# Patient Record
Sex: Male | Born: 1960
Health system: Southern US, Community
[De-identification: ages and names within clinical notes are randomized; demographics above are authoritative.]

## PROBLEM LIST (undated history)

## (undated) DIAGNOSIS — M1990 Unspecified osteoarthritis, unspecified site: Secondary | ICD-10-CM

## (undated) DIAGNOSIS — Z9981 Dependence on supplemental oxygen: Secondary | ICD-10-CM

## (undated) DIAGNOSIS — N200 Calculus of kidney: Secondary | ICD-10-CM

## (undated) DIAGNOSIS — Z8489 Family history of other specified conditions: Secondary | ICD-10-CM

## (undated) DIAGNOSIS — R0902 Hypoxemia: Secondary | ICD-10-CM

## (undated) DIAGNOSIS — R51 Headache: Secondary | ICD-10-CM

## (undated) DIAGNOSIS — G4733 Obstructive sleep apnea (adult) (pediatric): Secondary | ICD-10-CM

## (undated) DIAGNOSIS — E785 Hyperlipidemia, unspecified: Secondary | ICD-10-CM

## (undated) DIAGNOSIS — G629 Polyneuropathy, unspecified: Secondary | ICD-10-CM

## (undated) DIAGNOSIS — G839 Paralytic syndrome, unspecified: Secondary | ICD-10-CM

## (undated) DIAGNOSIS — I509 Heart failure, unspecified: Secondary | ICD-10-CM

## (undated) DIAGNOSIS — G709 Myoneural disorder, unspecified: Secondary | ICD-10-CM

## (undated) DIAGNOSIS — I517 Cardiomegaly: Secondary | ICD-10-CM

## (undated) DIAGNOSIS — K589 Irritable bowel syndrome without diarrhea: Secondary | ICD-10-CM

## (undated) DIAGNOSIS — T7840XA Allergy, unspecified, initial encounter: Secondary | ICD-10-CM

## (undated) DIAGNOSIS — IMO0002 Reserved for concepts with insufficient information to code with codable children: Secondary | ICD-10-CM

## (undated) DIAGNOSIS — T8859XA Other complications of anesthesia, initial encounter: Secondary | ICD-10-CM

## (undated) DIAGNOSIS — T4145XA Adverse effect of unspecified anesthetic, initial encounter: Secondary | ICD-10-CM

## (undated) DIAGNOSIS — R413 Other amnesia: Secondary | ICD-10-CM

## (undated) DIAGNOSIS — E291 Testicular hypofunction: Secondary | ICD-10-CM

## (undated) DIAGNOSIS — R519 Headache, unspecified: Secondary | ICD-10-CM

## (undated) DIAGNOSIS — N400 Enlarged prostate without lower urinary tract symptoms: Secondary | ICD-10-CM

## (undated) DIAGNOSIS — N419 Inflammatory disease of prostate, unspecified: Secondary | ICD-10-CM

## (undated) DIAGNOSIS — F419 Anxiety disorder, unspecified: Secondary | ICD-10-CM

## (undated) DIAGNOSIS — J189 Pneumonia, unspecified organism: Secondary | ICD-10-CM

## (undated) DIAGNOSIS — I10 Essential (primary) hypertension: Secondary | ICD-10-CM

## (undated) DIAGNOSIS — R0600 Dyspnea, unspecified: Secondary | ICD-10-CM

## (undated) DIAGNOSIS — F329 Major depressive disorder, single episode, unspecified: Secondary | ICD-10-CM

## (undated) DIAGNOSIS — R7303 Prediabetes: Secondary | ICD-10-CM

## (undated) DIAGNOSIS — J449 Chronic obstructive pulmonary disease, unspecified: Secondary | ICD-10-CM

## (undated) DIAGNOSIS — H269 Unspecified cataract: Secondary | ICD-10-CM

## (undated) DIAGNOSIS — F32A Depression, unspecified: Secondary | ICD-10-CM

## (undated) DIAGNOSIS — U071 COVID-19: Secondary | ICD-10-CM

## (undated) DIAGNOSIS — K219 Gastro-esophageal reflux disease without esophagitis: Secondary | ICD-10-CM

## (undated) DIAGNOSIS — G4730 Sleep apnea, unspecified: Secondary | ICD-10-CM

## (undated) DIAGNOSIS — E669 Obesity, unspecified: Secondary | ICD-10-CM

## (undated) HISTORY — PX: COLONOSCOPY: SHX174

## (undated) HISTORY — DX: Depression, unspecified: F32.A

## (undated) HISTORY — DX: Other complications of anesthesia, initial encounter: T88.59XA

## (undated) HISTORY — PX: ABDOMINAL SURGERY: SHX537

## (undated) HISTORY — DX: Reserved for concepts with insufficient information to code with codable children: IMO0002

## (undated) HISTORY — DX: Unspecified cataract: H26.9

## (undated) HISTORY — DX: Inflammatory disease of prostate, unspecified: N41.9

## (undated) HISTORY — DX: Heart failure, unspecified: I50.9

## (undated) HISTORY — DX: Hyperlipidemia, unspecified: E78.5

## (undated) HISTORY — PX: FRACTURE SURGERY: SHX138

## (undated) HISTORY — DX: Hypoxemia: R09.02

## (undated) HISTORY — PX: CYSTOSCOPY: SUR368

## (undated) HISTORY — PX: ANKLE FRACTURE SURGERY: SHX122

## (undated) HISTORY — DX: Sleep apnea, unspecified: G47.30

## (undated) HISTORY — DX: Irritable bowel syndrome, unspecified: K58.9

## (undated) HISTORY — PX: SPINE SURGERY: SHX786

## (undated) HISTORY — PX: UVULOPALATOPHARYNGOPLASTY: SHX827

## (undated) HISTORY — DX: Allergy, unspecified, initial encounter: T78.40XA

## (undated) HISTORY — DX: Myoneural disorder, unspecified: G70.9

## (undated) HISTORY — DX: Benign prostatic hyperplasia without lower urinary tract symptoms: N40.0

## (undated) HISTORY — DX: Testicular hypofunction: E29.1

## (undated) HISTORY — DX: Calculus of kidney: N20.0

## (undated) HISTORY — DX: Obstructive sleep apnea (adult) (pediatric): G47.33

## (undated) HISTORY — PX: TONSILLECTOMY: SUR1361

---
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
## 1998-06-23 ENCOUNTER — Emergency Department (HOSPITAL_COMMUNITY): Admission: EM | Admit: 1998-06-23 | Discharge: 1998-06-23 | Payer: Self-pay

## 2000-11-16 ENCOUNTER — Emergency Department (HOSPITAL_COMMUNITY): Admission: EM | Admit: 2000-11-16 | Discharge: 2000-11-16 | Payer: Self-pay | Admitting: Emergency Medicine

## 2000-12-28 ENCOUNTER — Ambulatory Visit (HOSPITAL_COMMUNITY): Admission: RE | Admit: 2000-12-28 | Discharge: 2000-12-28 | Payer: Self-pay | Admitting: Internal Medicine

## 2000-12-28 ENCOUNTER — Encounter: Payer: Self-pay | Admitting: Internal Medicine

## 2001-01-18 ENCOUNTER — Ambulatory Visit (HOSPITAL_COMMUNITY): Admission: RE | Admit: 2001-01-18 | Discharge: 2001-01-18 | Payer: Self-pay | Admitting: Gastroenterology

## 2001-02-04 ENCOUNTER — Encounter: Admission: RE | Admit: 2001-02-04 | Discharge: 2001-02-04 | Payer: Self-pay | Admitting: Urology

## 2001-02-04 ENCOUNTER — Encounter: Payer: Self-pay | Admitting: Urology

## 2001-12-24 ENCOUNTER — Ambulatory Visit (HOSPITAL_COMMUNITY): Admission: RE | Admit: 2001-12-24 | Discharge: 2001-12-24 | Payer: Self-pay | Admitting: Internal Medicine

## 2001-12-24 ENCOUNTER — Encounter: Payer: Self-pay | Admitting: Internal Medicine

## 2005-05-26 HISTORY — PX: TURBINATE RESECTION: SHX6158

## 2005-05-26 LAB — HM COLONOSCOPY

## 2005-07-04 ENCOUNTER — Ambulatory Visit (HOSPITAL_COMMUNITY): Admission: RE | Admit: 2005-07-04 | Discharge: 2005-07-04 | Payer: Self-pay | Admitting: Internal Medicine

## 2008-01-25 ENCOUNTER — Ambulatory Visit (HOSPITAL_COMMUNITY): Admission: RE | Admit: 2008-01-25 | Discharge: 2008-01-25 | Payer: Self-pay | Admitting: Internal Medicine

## 2008-05-11 ENCOUNTER — Encounter: Admission: RE | Admit: 2008-05-11 | Discharge: 2008-05-11 | Payer: Self-pay | Admitting: Internal Medicine

## 2009-08-07 ENCOUNTER — Encounter: Admission: RE | Admit: 2009-08-07 | Discharge: 2009-08-07 | Payer: Self-pay | Admitting: Internal Medicine

## 2010-09-09 ENCOUNTER — Other Ambulatory Visit: Payer: Self-pay | Admitting: Internal Medicine

## 2010-09-09 ENCOUNTER — Encounter (HOSPITAL_BASED_OUTPATIENT_CLINIC_OR_DEPARTMENT_OTHER): Payer: BC Managed Care – PPO | Admitting: Internal Medicine

## 2010-09-09 DIAGNOSIS — N400 Enlarged prostate without lower urinary tract symptoms: Secondary | ICD-10-CM

## 2010-09-09 DIAGNOSIS — I10 Essential (primary) hypertension: Secondary | ICD-10-CM

## 2010-09-09 DIAGNOSIS — J45909 Unspecified asthma, uncomplicated: Secondary | ICD-10-CM

## 2010-09-09 DIAGNOSIS — D72829 Elevated white blood cell count, unspecified: Secondary | ICD-10-CM

## 2010-09-09 LAB — CBC WITH DIFFERENTIAL/PLATELET
BASO%: 1 % (ref 0.0–2.0)
Basophils Absolute: 0.1 10*3/uL (ref 0.0–0.1)
EOS%: 2 % (ref 0.0–7.0)
Eosinophils Absolute: 0.2 10*3/uL (ref 0.0–0.5)
HCT: 43.3 % (ref 38.4–49.9)
HGB: 14.8 g/dL (ref 13.0–17.1)
LYMPH%: 26.6 % (ref 14.0–49.0)
MCH: 30.6 pg (ref 27.2–33.4)
MCHC: 34.2 g/dL (ref 32.0–36.0)
MCV: 89.4 fL (ref 79.3–98.0)
MONO#: 1 10*3/uL — ABNORMAL HIGH (ref 0.1–0.9)
MONO%: 9.4 % (ref 0.0–14.0)
NEUT#: 6.5 10*3/uL (ref 1.5–6.5)
NEUT%: 61 % (ref 39.0–75.0)
Platelets: 277 10*3/uL (ref 140–400)
RBC: 4.84 10*6/uL (ref 4.20–5.82)
RDW: 13.2 % (ref 11.0–14.6)
WBC: 10.7 10*3/uL — ABNORMAL HIGH (ref 4.0–10.3)
lymph#: 2.8 10*3/uL (ref 0.9–3.3)

## 2010-09-10 LAB — COMPREHENSIVE METABOLIC PANEL
ALT: 24 U/L (ref 0–53)
AST: 16 U/L (ref 0–37)
Alkaline Phosphatase: 64 U/L (ref 39–117)
Creatinine, Ser: 1 mg/dL (ref 0.40–1.50)
Sodium: 138 mEq/L (ref 135–145)
Total Bilirubin: 0.9 mg/dL (ref 0.3–1.2)

## 2010-12-25 ENCOUNTER — Other Ambulatory Visit: Payer: Self-pay | Admitting: Internal Medicine

## 2010-12-25 ENCOUNTER — Encounter (HOSPITAL_BASED_OUTPATIENT_CLINIC_OR_DEPARTMENT_OTHER): Payer: BC Managed Care – PPO | Admitting: Internal Medicine

## 2010-12-25 DIAGNOSIS — D72829 Elevated white blood cell count, unspecified: Secondary | ICD-10-CM

## 2010-12-25 LAB — CBC WITH DIFFERENTIAL/PLATELET
Basophils Absolute: 0.1 10*3/uL (ref 0.0–0.1)
Eosinophils Absolute: 0.3 10*3/uL (ref 0.0–0.5)
HCT: 41.9 % (ref 38.4–49.9)
HGB: 14.1 g/dL (ref 13.0–17.1)
MCV: 89.8 fL (ref 79.3–98.0)
MONO%: 9.7 % (ref 0.0–14.0)
NEUT#: 5.8 10*3/uL (ref 1.5–6.5)
NEUT%: 55.2 % (ref 39.0–75.0)
Platelets: 275 10*3/uL (ref 140–400)
RDW: 13.3 % (ref 11.0–14.6)

## 2011-01-15 ENCOUNTER — Ambulatory Visit (HOSPITAL_COMMUNITY)
Admission: RE | Admit: 2011-01-15 | Discharge: 2011-01-15 | Disposition: A | Discharge status: Home / Self Care | Payer: BC Managed Care – PPO | Source: Ambulatory Visit | Attending: Internal Medicine | Admitting: Internal Medicine

## 2011-01-15 ENCOUNTER — Other Ambulatory Visit (HOSPITAL_COMMUNITY): Payer: Self-pay | Admitting: Internal Medicine

## 2011-01-15 DIAGNOSIS — I10 Essential (primary) hypertension: Secondary | ICD-10-CM | POA: Insufficient documentation

## 2011-01-15 DIAGNOSIS — J42 Unspecified chronic bronchitis: Secondary | ICD-10-CM | POA: Insufficient documentation

## 2011-01-15 DIAGNOSIS — J45909 Unspecified asthma, uncomplicated: Secondary | ICD-10-CM | POA: Insufficient documentation

## 2011-01-15 DIAGNOSIS — R509 Fever, unspecified: Secondary | ICD-10-CM

## 2011-01-29 ENCOUNTER — Ambulatory Visit: Payer: BC Managed Care – PPO | Attending: Unknown Physician Specialty | Admitting: Physical Therapy

## 2011-01-29 DIAGNOSIS — R262 Difficulty in walking, not elsewhere classified: Secondary | ICD-10-CM | POA: Insufficient documentation

## 2011-01-29 DIAGNOSIS — M2560 Stiffness of unspecified joint, not elsewhere classified: Secondary | ICD-10-CM | POA: Insufficient documentation

## 2011-01-29 DIAGNOSIS — M545 Low back pain, unspecified: Secondary | ICD-10-CM | POA: Insufficient documentation

## 2011-01-29 DIAGNOSIS — IMO0001 Reserved for inherently not codable concepts without codable children: Secondary | ICD-10-CM | POA: Insufficient documentation

## 2011-01-31 DIAGNOSIS — K589 Irritable bowel syndrome without diarrhea: Secondary | ICD-10-CM | POA: Insufficient documentation

## 2011-01-31 DIAGNOSIS — N400 Enlarged prostate without lower urinary tract symptoms: Secondary | ICD-10-CM | POA: Insufficient documentation

## 2011-01-31 DIAGNOSIS — E291 Testicular hypofunction: Secondary | ICD-10-CM | POA: Insufficient documentation

## 2011-01-31 DIAGNOSIS — I10 Essential (primary) hypertension: Secondary | ICD-10-CM | POA: Insufficient documentation

## 2011-01-31 DIAGNOSIS — N419 Inflammatory disease of prostate, unspecified: Secondary | ICD-10-CM | POA: Insufficient documentation

## 2011-01-31 DIAGNOSIS — J329 Chronic sinusitis, unspecified: Secondary | ICD-10-CM

## 2011-01-31 DIAGNOSIS — G43909 Migraine, unspecified, not intractable, without status migrainosus: Secondary | ICD-10-CM | POA: Insufficient documentation

## 2011-01-31 DIAGNOSIS — E782 Mixed hyperlipidemia: Secondary | ICD-10-CM | POA: Insufficient documentation

## 2011-01-31 DIAGNOSIS — F329 Major depressive disorder, single episode, unspecified: Secondary | ICD-10-CM

## 2011-01-31 DIAGNOSIS — G40219 Localization-related (focal) (partial) symptomatic epilepsy and epileptic syndromes with complex partial seizures, intractable, without status epilepticus: Secondary | ICD-10-CM

## 2011-01-31 DIAGNOSIS — F32A Depression, unspecified: Secondary | ICD-10-CM

## 2011-01-31 DIAGNOSIS — J449 Chronic obstructive pulmonary disease, unspecified: Secondary | ICD-10-CM | POA: Insufficient documentation

## 2011-01-31 DIAGNOSIS — F3341 Major depressive disorder, recurrent, in partial remission: Secondary | ICD-10-CM | POA: Insufficient documentation

## 2011-01-31 DIAGNOSIS — E785 Hyperlipidemia, unspecified: Secondary | ICD-10-CM

## 2011-01-31 DIAGNOSIS — F341 Dysthymic disorder: Secondary | ICD-10-CM

## 2011-02-04 ENCOUNTER — Ambulatory Visit: Payer: BC Managed Care – PPO

## 2011-02-11 ENCOUNTER — Ambulatory Visit: Payer: BC Managed Care – PPO | Admitting: Physical Therapy

## 2011-02-18 ENCOUNTER — Ambulatory Visit: Payer: BC Managed Care – PPO | Admitting: Infectious Disease

## 2011-02-18 ENCOUNTER — Ambulatory Visit: Payer: BC Managed Care – PPO | Admitting: Rehabilitation

## 2011-02-19 ENCOUNTER — Ambulatory Visit: Payer: BC Managed Care – PPO | Admitting: Infectious Disease

## 2011-02-24 ENCOUNTER — Ambulatory Visit (INDEPENDENT_AMBULATORY_CARE_PROVIDER_SITE_OTHER): Payer: BC Managed Care – PPO | Admitting: Infectious Disease

## 2011-02-24 ENCOUNTER — Encounter: Payer: Self-pay | Admitting: Infectious Disease

## 2011-02-24 VITALS — BP 116/72 | HR 69 | Temp 99.4°F | Wt 311.0 lb

## 2011-02-24 DIAGNOSIS — R635 Abnormal weight gain: Secondary | ICD-10-CM

## 2011-02-24 DIAGNOSIS — R509 Fever, unspecified: Secondary | ICD-10-CM

## 2011-02-24 DIAGNOSIS — R5383 Other fatigue: Secondary | ICD-10-CM

## 2011-02-24 DIAGNOSIS — J329 Chronic sinusitis, unspecified: Secondary | ICD-10-CM

## 2011-02-24 DIAGNOSIS — N400 Enlarged prostate without lower urinary tract symptoms: Secondary | ICD-10-CM

## 2011-02-24 DIAGNOSIS — R413 Other amnesia: Secondary | ICD-10-CM

## 2011-02-24 DIAGNOSIS — N419 Inflammatory disease of prostate, unspecified: Secondary | ICD-10-CM

## 2011-02-24 DIAGNOSIS — R61 Generalized hyperhidrosis: Secondary | ICD-10-CM

## 2011-02-24 DIAGNOSIS — R200 Anesthesia of skin: Secondary | ICD-10-CM

## 2011-02-24 DIAGNOSIS — D72829 Elevated white blood cell count, unspecified: Secondary | ICD-10-CM

## 2011-02-24 DIAGNOSIS — R209 Unspecified disturbances of skin sensation: Secondary | ICD-10-CM

## 2011-02-24 DIAGNOSIS — R5381 Other malaise: Secondary | ICD-10-CM

## 2011-02-24 NOTE — Assessment & Plan Note (Addendum)
Curious history. Be highly unlikely for this patient to have a malignancy or a pyogenic infectious problem or even a infection with tuberculosis or dimorphic fungus that would last for 4 years. Certainly other as he says his connective tissue disorder due to need to be considered and he does have some mild disease about historian clinically has chronic leukocytosis. We will basic fever unknown origin labs including checking an HIV RNA of note his HIV antibody was negative RA, will check cytomegalovirus IgG and IgM we'll check an Epstein-Barr virus panel we'll check an acute viral hepatitis panel will check a CPK and LDH and ACE level a serum ferritin level will check a CBC with differential a comprehensive metabolic panel will check a serum protein electrophoresis serum cryoglobulins, serum quantiferon gold, dry factor and an ANA another set of blood cultures. Will check a CT of the chest abdomen and pelvis. I suspect the patient's symptoms may relate be related to something such as chronic fatigue but certainly this is a diagnosis of exclusion. Nails consider bone marrow biopsy given his elevated white blood cell count that we do not really have an explanation for.

## 2011-02-24 NOTE — Assessment & Plan Note (Signed)
Of discussion. Was thought to be reactive by oncology but we do not know what is the anterior that is causing this reactive leukocytosis. It appears to be chronic in nature. I think a bone marrow biopsy may be something that may be of use in the future.

## 2011-02-24 NOTE — Assessment & Plan Note (Signed)
He has had problems with recurrent prostatitis. However recent cultures have all been negative. I did he's ever been treated for chronic prostatitis with a prolonged course of antibiotics I would've thought that he would've by now but he did obtain further records. We could consider retreating him for chronic prostatitis if the entire fever of unknown origin workup is negative.

## 2011-02-24 NOTE — Progress Notes (Signed)
Subjective:    Patient ID: Mark Harrell, male    DOB: 07-Jan-1961, 50 y.o.   MRN: [id number]  HPI  Is a 50 year old Caucasian male with past medical history  Significant for head injury in the 1980s, now with memory problems (on aricept and namenda) complex partial seizure disorder, BPH, hypogonadism, seminal vesiculitis, bladder outlet obstruction, recurrent sinusitis and chronic leukocytosis of unknown cause. He had already been seen by one of my partners thirty years ago (Dr Maurice March) for an "odd viral infection." He states that approximately 4 years ago he began to notice persistent temperature elevlations with temp always runnin at 99 or higher, at times above 100. He also has been complaining of diffuse sweating, drenching at times, malaise, poor energy. He denies weight gain and has actually gained weight over these past several years and now has sleep apnea. He denies symptoms of nausea or vomiting or diarrhea. He has had problems related to his prostate including an enlarged prostate and difficulty with nocturia at times of urinary incontinence. He's been seen closely by Dr. Patsi Sears with urology.   He does have an extensive travel history having traveled throughout [place] use in [place] including the former [employer] several countries and former [place] as well as after a couple twice in his affect and [place]. He's never tested positive for tuberculosis. As mentioned he has seen Dr. Arbutus Ped in the cancer center for workup of his leukocytosis Dr. Holley Dexter felt the patient had chronic reactive leukocytosis although it is not clear what the patient is having reactive leukocytosis 2. Most recent labs on him were significant for a C-reactive protein of 10.3 white count of 10,700 normal liver function tests normal thyroid-stimulating hormone slightly low testosterone level negative HIV 1 and 2 test negative gonorrhea and Chlamydia test negative for herpes 1 and 2 IgG he's a chest x-ray which  shows cardiomegaly and some bronchitic changes his 2-D echocardiogram which shows a difficult study with left ventricle is borderline dilated and ejection fraction greater than 55% with some mild mitral regurgitation blood culture then obtained and are negative urine cultures have not grown anything either on recent cultures. We were Consulted by the patient's primary care physician Dr. Kasandra Knudsen to assist in the workup of this patient with fever of unknown origin. In total we spent an hour with the pt including greater than 50% of time counselling the patient face to face and in coordination of his care and review of his records.  Review of Systems  Constitutional: Positive for fever, diaphoresis, fatigue and unexpected weight change. Negative for chills, activity change and appetite change.  HENT: Negative for congestion, sore throat, rhinorrhea, sneezing, trouble swallowing and sinus pressure.   Eyes: Negative for photophobia and visual disturbance.  Respiratory: Negative for cough, chest tightness, shortness of breath, wheezing and stridor.   Cardiovascular: Positive for palpitations. Negative for chest pain and leg swelling.  Gastrointestinal: Negative for nausea, vomiting, abdominal pain, diarrhea, constipation, blood in stool, abdominal distention and anal bleeding.  Genitourinary: Positive for frequency and difficulty urinating. Negative for dysuria, hematuria and flank pain.  Musculoskeletal: Positive for myalgias. Negative for back pain, joint swelling, arthralgias and gait problem.  Skin: Negative for color change, pallor, rash and wound.  Neurological: Negative for dizziness, tremors, weakness and light-headedness.  Hematological: Negative for adenopathy. Does not bruise/bleed easily.  Psychiatric/Behavioral: Positive for dysphoric mood. Negative for behavioral problems, confusion, sleep disturbance, decreased concentration and agitation.       Objective:  Physical Exam  Constitutional:  He is oriented to person, place, and time. He appears well-developed and well-nourished. No distress.  HENT:  Head: Normocephalic and atraumatic.  Mouth/Throat: Oropharynx is clear and moist. No oropharyngeal exudate.  Eyes: Conjunctivae and EOM are normal. Pupils are equal, round, and reactive to light. No scleral icterus.  Neck: Normal range of motion. Neck supple. No JVD present.  Cardiovascular: Normal rate, regular rhythm and normal heart sounds.  Exam reveals no gallop and no friction rub.   No murmur heard. Pulmonary/Chest: Effort normal and breath sounds normal. No respiratory distress. He has no wheezes. He has no rales. He exhibits no tenderness.  Abdominal: He exhibits no distension and no mass. There is no tenderness. There is no rebound and no guarding.  Musculoskeletal: He exhibits no edema and no tenderness.  Lymphadenopathy:    He has cervical adenopathy.       Right cervical: No superficial cervical, no deep cervical and no posterior cervical adenopathy present.      Left cervical: No superficial cervical, no deep cervical and no posterior cervical adenopathy present.       Right axillary: No pectoral and no lateral adenopathy present.       Left axillary: No pectoral and no lateral adenopathy present. Neurological: He is alert and oriented to person, place, and time. He has normal reflexes. He exhibits normal muscle tone. He displays no seizure activity. Coordination normal.  Skin: Skin is warm and dry. He is not diaphoretic. No erythema. No pallor.  Psychiatric: He has a normal mood and affect. His behavior is normal. Judgment and thought content normal.          Assessment & Plan:  Fever Curious history. Be highly unlikely for this patient to have a malignancy or a pyogenic infectious problem or even a infection with tuberculosis or dimorphic fungus that would last for 4 years. Certainly other as he says his connective tissue disorder due to need to be considered and  he does have some mild disease about historian clinically has chronic leukocytosis. We will basic fever unknown origin labs including checking an HIV RNA of note his HIV antibody was negative RA, will check cytomegalovirus IgG and IgM we'll check an Epstein-Barr virus panel we'll check an acute viral hepatitis panel will check a CPK and LDH and ACE level a serum ferritin level will check a CBC with differential a comprehensive metabolic panel will check a serum protein electrophoresis serum cryoglobulins, serum quantiferon gold, dry factor and an ANA another set of blood cultures. Will check a CT of the chest abdomen and pelvis. I suspect the patient's symptoms may relate be related to something such as chronic fatigue but certainly this is a diagnosis of exclusion. Nails consider bone marrow biopsy given his elevated white blood cell count that we do not really have an explanation for.  Leukocytosis Of discussion. Was thought to be reactive by oncology but we do not know what is the anterior that is causing this reactive leukocytosis. It appears to be chronic in nature. I think a bone marrow biopsy may be something that may be of use in the future.  Malaise and fatigue I suspect Chronic fatigue may be playing a role. Certainly if his sleep apnea is not well treated this could be contributing as well.  Diaphoresis Again his story is dramatic for the sweats but he lacks weight loss and this has persisted for 4 years making as I described above a serious pyogenic infection  or serious malignancy unlikely. His TSH was normal ? untreated sleep apnea that might be contributing to his night sweats  Left arm numbness Apparently MRIs have been normal. Otherwise I would consider possibility of discitis  Prostatitis He has had problems with recurrent prostatitis. However recent cultures have all been negative. I did he's ever been treated for chronic prostatitis with a prolonged course of antibiotics I would've  thought that he would've by now but he did obtain further records. We could consider retreating him for chronic prostatitis if the entire fever of unknown origin workup is negative.

## 2011-02-24 NOTE — Patient Instructions (Signed)
We need to perform blood work on you today We need to obtain scan of your chest abdomen and pelvis Keep a diary of your temperatures and symptoms

## 2011-02-24 NOTE — Assessment & Plan Note (Signed)
Apparently MRIs have been normal. Otherwise I would consider possibility of discitis

## 2011-02-24 NOTE — Assessment & Plan Note (Signed)
Again his story is dramatic for the sweats but he lacks weight loss and this has persisted for 4 years making as I described above a serious pyogenic infection or serious malignancy unlikely. His TSH was normal ? untreated sleep apnea that might be contributing to his night sweats

## 2011-02-24 NOTE — Assessment & Plan Note (Signed)
I suspect Chronic fatigue may be playing a role. Certainly if his sleep apnea is not well treated this could be contributing as well.

## 2011-02-25 ENCOUNTER — Other Ambulatory Visit: Payer: Self-pay | Admitting: Infectious Disease

## 2011-02-25 ENCOUNTER — Ambulatory Visit: Payer: BC Managed Care – PPO | Attending: Unknown Physician Specialty | Admitting: Physical Therapy

## 2011-02-25 DIAGNOSIS — M2560 Stiffness of unspecified joint, not elsewhere classified: Secondary | ICD-10-CM | POA: Insufficient documentation

## 2011-02-25 DIAGNOSIS — IMO0001 Reserved for inherently not codable concepts without codable children: Secondary | ICD-10-CM | POA: Insufficient documentation

## 2011-02-25 DIAGNOSIS — M545 Low back pain, unspecified: Secondary | ICD-10-CM | POA: Insufficient documentation

## 2011-02-25 DIAGNOSIS — R262 Difficulty in walking, not elsewhere classified: Secondary | ICD-10-CM | POA: Insufficient documentation

## 2011-02-25 LAB — LACTATE DEHYDROGENASE: LDH: 162 U/L (ref 94–250)

## 2011-02-25 LAB — COMPLETE METABOLIC PANEL WITH GFR
ALT: 20 U/L (ref 0–53)
AST: 17 U/L (ref 0–37)
Albumin: 4.2 g/dL (ref 3.5–5.2)
Alkaline Phosphatase: 79 U/L (ref 39–117)
GFR, Est Non African American: >60 mL/min (ref >60)
Glucose, Bld: 77 mg/dL (ref 70–99)
Potassium: 4.2 mEq/L (ref 3.5–5.3)
Sodium: 139 mEq/L (ref 135–145)
Total Bilirubin: 0.7 mg/dL (ref 0.3–1.2)
Total Protein: 6.6 g/dL (ref 6.0–8.3)

## 2011-02-25 LAB — CBC WITH DIFFERENTIAL/PLATELET
Basophils Absolute: 0.1 10*3/uL (ref 0.0–0.1)
Basophils Relative: 1 % (ref 0–1)
Eosinophils Absolute: 0.2 10*3/uL (ref 0.0–0.7)
Hemoglobin: 14.8 g/dL (ref 13.0–17.0)
MCH: 29 pg (ref 26.0–34.0)
MCHC: 32.2 g/dL (ref 30.0–36.0)
Monocytes Relative: 8 % (ref 3–12)
Neutro Abs: 5.3 10*3/uL (ref 1.7–7.7)
Neutrophils Relative %: 59 % (ref 43–77)
Platelets: 309 10*3/uL (ref 150–400)
RDW: 13.7 % (ref 11.5–15.5)

## 2011-02-25 LAB — FERRITIN: Ferritin: 179 ng/mL (ref 22–322)

## 2011-02-25 LAB — CK: Total CK: 52 U/L (ref 7–232)

## 2011-02-25 LAB — SEDIMENTATION RATE: Sed Rate: 8 mm/hr (ref 0–16)

## 2011-02-25 LAB — HEPATITIS PANEL, ACUTE
Hep A IgM: NEGATIVE
Hep B C IgM: NEGATIVE
Hepatitis B Surface Ag: NEGATIVE

## 2011-02-25 LAB — C-REACTIVE PROTEIN: CRP: 1.12 mg/dL — ABNORMAL HIGH (ref <0.60)

## 2011-02-25 LAB — RHEUMATOID FACTOR: Rhuematoid fact SerPl-aCnc: <10 IU/mL (ref <=14)

## 2011-02-25 LAB — EPSTEIN-BARR VIRUS VCA ANTIBODY PANEL: EBV NA IgG: 3.52 {ISR} — ABNORMAL HIGH

## 2011-02-26 LAB — PROTEIN ELECTROPHORESIS, SERUM
Alpha-1-Globulin: 5.4 % — ABNORMAL HIGH (ref 2.9–4.9)
Beta 2: 5.9 % (ref 3.2–6.5)
Gamma Globulin: 13.4 % (ref 11.1–18.8)

## 2011-02-27 ENCOUNTER — Other Ambulatory Visit (HOSPITAL_COMMUNITY): Payer: BC Managed Care – PPO

## 2011-03-01 LAB — CRYOGLOBULIN

## 2011-03-03 LAB — CMV ABS, IGG+IGM (CYTOMEGALOVIRUS): CMV IgM: 0.09 (ref <0.90)

## 2011-03-03 LAB — CULTURE, BLOOD (SINGLE): Organism ID, Bacteria: NO GROWTH

## 2011-03-06 ENCOUNTER — Encounter (HOSPITAL_COMMUNITY): Payer: Self-pay

## 2011-03-06 ENCOUNTER — Ambulatory Visit (HOSPITAL_COMMUNITY)
Admission: RE | Admit: 2011-03-06 | Discharge: 2011-03-06 | Disposition: A | Discharge status: Home / Self Care | Payer: BC Managed Care – PPO | Source: Ambulatory Visit | Attending: Infectious Disease | Admitting: Infectious Disease

## 2011-03-06 DIAGNOSIS — K589 Irritable bowel syndrome without diarrhea: Secondary | ICD-10-CM | POA: Insufficient documentation

## 2011-03-06 DIAGNOSIS — I10 Essential (primary) hypertension: Secondary | ICD-10-CM | POA: Insufficient documentation

## 2011-03-06 DIAGNOSIS — K7689 Other specified diseases of liver: Secondary | ICD-10-CM | POA: Insufficient documentation

## 2011-03-06 DIAGNOSIS — R52 Pain, unspecified: Secondary | ICD-10-CM | POA: Insufficient documentation

## 2011-03-06 DIAGNOSIS — R509 Fever, unspecified: Secondary | ICD-10-CM | POA: Insufficient documentation

## 2011-03-06 DIAGNOSIS — K5730 Diverticulosis of large intestine without perforation or abscess without bleeding: Secondary | ICD-10-CM | POA: Insufficient documentation

## 2011-03-06 DIAGNOSIS — N400 Enlarged prostate without lower urinary tract symptoms: Secondary | ICD-10-CM | POA: Insufficient documentation

## 2011-03-06 DIAGNOSIS — R51 Headache: Secondary | ICD-10-CM | POA: Insufficient documentation

## 2011-03-06 HISTORY — DX: Essential (primary) hypertension: I10

## 2011-03-06 MED ORDER — IOHEXOL 300 MG/ML  SOLN
80.0000 mL | Freq: Once | INTRAMUSCULAR | Status: AC | PRN
  Administered 2011-03-06: 80 mL via INTRAVENOUS

## 2011-03-11 ENCOUNTER — Encounter: Payer: BC Managed Care – PPO | Admitting: Physical Therapy

## 2011-03-18 ENCOUNTER — Ambulatory Visit: Payer: BC Managed Care – PPO | Admitting: Physical Therapy

## 2011-03-24 ENCOUNTER — Telehealth: Payer: Self-pay | Admitting: Licensed Clinical Social Worker

## 2011-03-24 NOTE — Telephone Encounter (Signed)
This patient was supposed to f/u on 04/07/2011, he did not make a return appointment due to the schedule not being up last month. I scheduled him for 04/30/2011 to see Dr. Daiva Eves. He wants to know if he needs to be seen sooner because he is still having the same symptoms as before, and he wants to know if the spinal tap the he and the doctor discussed is still necessary.

## 2011-03-27 ENCOUNTER — Ambulatory Visit: Payer: BC Managed Care – PPO | Attending: Unknown Physician Specialty | Admitting: Physical Therapy

## 2011-03-27 DIAGNOSIS — R262 Difficulty in walking, not elsewhere classified: Secondary | ICD-10-CM | POA: Insufficient documentation

## 2011-03-27 DIAGNOSIS — M545 Low back pain, unspecified: Secondary | ICD-10-CM | POA: Insufficient documentation

## 2011-03-27 DIAGNOSIS — M2560 Stiffness of unspecified joint, not elsewhere classified: Secondary | ICD-10-CM | POA: Insufficient documentation

## 2011-03-27 DIAGNOSIS — IMO0001 Reserved for inherently not codable concepts without codable children: Secondary | ICD-10-CM | POA: Insufficient documentation

## 2011-03-31 NOTE — Telephone Encounter (Signed)
We can try to overbook him sometime this month if need be.

## 2011-04-02 ENCOUNTER — Ambulatory Visit: Payer: BC Managed Care – PPO | Admitting: Physical Therapy

## 2011-04-03 ENCOUNTER — Encounter: Payer: BC Managed Care – PPO | Admitting: Physical Therapy

## 2011-04-08 ENCOUNTER — Ambulatory Visit: Payer: BC Managed Care – PPO

## 2011-04-15 ENCOUNTER — Ambulatory Visit: Payer: BC Managed Care – PPO | Admitting: Physical Therapy

## 2011-04-28 ENCOUNTER — Ambulatory Visit: Payer: BC Managed Care – PPO | Attending: Unknown Physician Specialty | Admitting: Physical Therapy

## 2011-04-28 DIAGNOSIS — IMO0001 Reserved for inherently not codable concepts without codable children: Secondary | ICD-10-CM | POA: Insufficient documentation

## 2011-04-28 DIAGNOSIS — M545 Low back pain, unspecified: Secondary | ICD-10-CM | POA: Insufficient documentation

## 2011-04-28 DIAGNOSIS — M2560 Stiffness of unspecified joint, not elsewhere classified: Secondary | ICD-10-CM | POA: Insufficient documentation

## 2011-04-28 DIAGNOSIS — R262 Difficulty in walking, not elsewhere classified: Secondary | ICD-10-CM | POA: Insufficient documentation

## 2011-05-05 ENCOUNTER — Ambulatory Visit: Payer: BC Managed Care – PPO | Admitting: Physical Therapy

## 2011-05-07 ENCOUNTER — Ambulatory Visit (INDEPENDENT_AMBULATORY_CARE_PROVIDER_SITE_OTHER): Payer: BC Managed Care – PPO | Admitting: Infectious Disease

## 2011-05-07 ENCOUNTER — Telehealth: Payer: Self-pay | Admitting: Licensed Clinical Social Worker

## 2011-05-07 ENCOUNTER — Encounter: Payer: Self-pay | Admitting: Infectious Disease

## 2011-05-07 VITALS — BP 117/78 | HR 59 | Temp 98.7°F | Resp 14 | Ht 72.0 in | Wt 310.0 lb

## 2011-05-07 DIAGNOSIS — R5383 Other fatigue: Secondary | ICD-10-CM

## 2011-05-07 DIAGNOSIS — R51 Headache: Secondary | ICD-10-CM

## 2011-05-07 DIAGNOSIS — K5730 Diverticulosis of large intestine without perforation or abscess without bleeding: Secondary | ICD-10-CM | POA: Insufficient documentation

## 2011-05-07 DIAGNOSIS — R5381 Other malaise: Secondary | ICD-10-CM

## 2011-05-07 DIAGNOSIS — R509 Fever, unspecified: Secondary | ICD-10-CM

## 2011-05-07 LAB — CBC WITH DIFFERENTIAL/PLATELET
Eosinophils Absolute: 0.2 10*3/uL (ref 0.0–0.7)
Eosinophils Relative: 2 % (ref 0–5)
Lymphs Abs: 3.1 10*3/uL (ref 0.7–4.0)
MCH: 30.9 pg (ref 26.0–34.0)
MCHC: 34 g/dL (ref 30.0–36.0)
MCV: 90.8 fL (ref 78.0–100.0)
Platelets: 326 10*3/uL (ref 150–400)
RBC: 4.79 MIL/uL (ref 4.22–5.81)
RDW: 13.4 % (ref 11.5–15.5)

## 2011-05-07 LAB — RPR

## 2011-05-07 LAB — SEDIMENTATION RATE: Sed Rate: 11 mm/hr (ref 0–16)

## 2011-05-07 LAB — C-REACTIVE PROTEIN: CRP: 1.03 mg/dL — ABNORMAL HIGH (ref <0.60)

## 2011-05-07 NOTE — Assessment & Plan Note (Signed)
No diverticulitis but in light of his recurrent symptoms and with CT findings would refer back to GI for repeawt colonoscopy

## 2011-05-07 NOTE — Assessment & Plan Note (Signed)
I am fairly certain that much of what he has is chronic fatigue syndrome

## 2011-05-07 NOTE — Assessment & Plan Note (Signed)
Not clear what cause of his fevers are and we have not actually measured a fever greater than 101.5 here. I will proceed per patients wishes for MRI brain, and LP to look for chonic meningitis. I will check serum rpr today. oN lp will check cell count diff, protein, glucose, csf bacterial, fungal and afb cultures.  I think having him see GI for colonoscopy is reasonable given diverticula seen on CT scan.    We could consider following other tests Bone marrow biopsy, PET scan. We could consider sending genetic testing for FMediterranean fever, Hibrernian fever, TRaps. We could consider trial of empiric abx for prostatis though his worku by Urology has been completely unremarkable for infection. We could consider trial lof NSAIDS or of colchicine.

## 2011-05-07 NOTE — Telephone Encounter (Signed)
Called patient and left message on his cell phone as requested to give him information for his MRI which is scheduled for  05/15/2011 at 3:00pm at [facility], and his Lumbar Puncture which is 05/16/2011 at 10:00am needs to arrive at 9:00am in short stay on the 2nd floor. The patient will be called prior to procedure for screening of medications and to give him instructions.

## 2011-05-07 NOTE — Assessment & Plan Note (Signed)
See above discussion

## 2011-05-07 NOTE — Progress Notes (Signed)
Subjective:    Patient ID: Mark Harrell, male    DOB: 21-Jul-1960, 50 y.o.   MRN: [id number]  HPI  Is a 50 year old Caucasian male with FUO presents with continued fevers.    His  past medical history Significant for head injury in the 1980s, now with memory problems (on aricept and namenda) complex partial seizure disorder, BPH, hypogonadism, seminal vesiculitis, bladder outlet obstruction, recurrent sinusitis and chronic leukocytosis of unknown cause. He had already been seen by one of my partners thirty years ago (Dr Maurice March) for an "odd viral infection." He states that approximately 4 years ago he began to notice persistent temperature elevlations with temp always runnin at 99 or higher, at times above 100. He also has been complaining of diffuse sweating, drenching at times, malaise, poor energy. He denies weight gain and has actually gained weight over these past several years and now has sleep apnea. He denies symptoms of nausea or vomiting or diarrhea. He has had problems related to his prostate including an enlarged prostate and difficulty with nocturia at times of urinary incontinence. He's been seen closely by Dr. Patsi Sears with urology. He does have an extensive travel history having traveled throughout [place] use in [place] including the former [employer] several countries and former [place] as well as after a couple twice in his affect and [place]. He's never tested positive for tuberculosis. As mentioned he has seen Dr. Arbutus Ped in the cancer center for workup of his leukocytosis Dr. Shirline Frees felt the patient had chronic reactive leukocytosis although it is not clear what the patient is having reactive leukocytosis to.   Labs here in [facility] were unremarkable. Normal ESR< CRP,  of 1, a wbc of f 10,700 normal liver function tests normal thyroid-stimulating hormone slightly low testosterone level negative HIV 1 and 2 test negative gonorrhea and Chlamydia test negative for herpes 1 and 2  IgG he's a chest x-ray which shows cardiomegaly and some bronchitic changes his 2-D echocardiogram which shows a difficult study with left ventricle is borderline dilated and ejection fraction greater than 55% with some mild mitral regurgitation blood culture then obtained and are negative urine cultures have not grown anything either on recent cultures. He also had Interferon gamma release which was negative, as was hepatitis panel, SPEP, CPK< LDH, ACE, RFactor. His CT chest abdomen and pelvis showed some thickening of colon nonspecifuc read as possibly due to past diverticulitis, (or subacute disease)  He is c/o diarrhea multiple times a day. Leakage of stool. He is suffering from BPH symptoms. He is c/o headaches, difficulty with emotions, memory, tinnitus. He is frurstrated that our thermometer did not register a fever but his own apparently did register 99.5, He states that this was temp at Urology (where it was in fact 99.7) He has a fairly "pan positive review of systems," We discussed various other tests that could be performed and empiric trials of NSAIds, colchicine or antibiotics (For prostatits for ex). Ultiamtely he was interested in LP to rule out chronic meningitis. I spent greater than 45 minutes with the patient including greater than 50% of time in face to face counsel of the patient and in coordination of their care.    Review of Systems  Constitutional: Positive for fever, chills, diaphoresis and appetite change. Negative for activity change, fatigue and unexpected weight change.  HENT: Positive for congestion. Negative for sore throat, rhinorrhea, sneezing, trouble swallowing and sinus pressure.   Eyes: Negative for photophobia and visual disturbance.  Respiratory:  Negative for cough, chest tightness, shortness of breath, wheezing and stridor.   Cardiovascular: Negative for chest pain, palpitations and leg swelling.  Gastrointestinal: Positive for abdominal pain, diarrhea,  constipation and abdominal distention. Negative for nausea, vomiting, blood in stool and anal bleeding.  Genitourinary: Positive for dysuria, urgency, frequency, enuresis and testicular pain. Negative for hematuria, flank pain and difficulty urinating.  Musculoskeletal: Positive for myalgias, back pain, joint swelling, arthralgias and gait problem.  Skin: Negative for color change, pallor, rash and wound.  Neurological: Positive for dizziness, weakness and headaches. Negative for tremors and light-headedness.  Hematological: Negative for adenopathy. Does not bruise/bleed easily.  Psychiatric/Behavioral: Positive for sleep disturbance, dysphoric mood and decreased concentration. Negative for behavioral problems, confusion and agitation. The patient is nervous/anxious.        Objective:   Physical Exam  Constitutional: He is oriented to person, place, and time. He appears well-developed and well-nourished. No distress.  HENT:  Head: Normocephalic and atraumatic.  Mouth/Throat: Oropharynx is clear and moist. No oropharyngeal exudate.  Eyes: Conjunctivae and EOM are normal. Pupils are equal, round, and reactive to light. No scleral icterus.  Neck: Normal range of motion. Neck supple. No JVD present.  Cardiovascular: Normal rate, regular rhythm and normal heart sounds.  Exam reveals no gallop and no friction rub.   No murmur heard. Pulmonary/Chest: Effort normal and breath sounds normal. No respiratory distress. He has no wheezes. He has no rales. He exhibits no tenderness.  Abdominal: He exhibits no distension and no mass. There is no tenderness. There is no rebound and no guarding.  Musculoskeletal: He exhibits no edema and no tenderness.  Lymphadenopathy:    He has no cervical adenopathy.  Neurological: He is alert and oriented to person, place, and time. He has normal reflexes. He exhibits normal muscle tone. Coordination normal.  Skin: Skin is warm and dry. He is not diaphoretic. No  erythema. No pallor.  Psychiatric: He has a normal mood and affect. His behavior is normal. Judgment and thought content normal.    Fever Not clear what cause of his fevers are and we have not actually measured a fever greater than 101.5 here. I will proceed per patients wishes for MRI brain, and LP to look for chonic meningitis. I will check serum rpr today. oN lp will check cell count diff, protein, glucose, csf bacterial, fungal and afb cultures.  I think having him see GI for colonoscopy is reasonable given diverticula seen on CT scan.    We could consider following other tests Bone marrow biopsy, PET scan. We could consider sending genetic testing for FMediterranean fever, Hibrernian fever, TRaps. We could consider trial of empiric abx for prostatis though his worku by Urology has been completely unremarkable for infection. We could consider trial lof NSAIDS or of colchicine.   Headache See above discussion  Malaise and fatigue I am fairly certain that much of what he has is chronic fatigue syndrome  Diverticula of colon No diverticulitis but in light of his recurrent symptoms and with CT findings would refer back to GI for repeawt colonoscopy          Assessment & Plan:

## 2011-05-13 ENCOUNTER — Ambulatory Visit: Payer: BC Managed Care – PPO | Admitting: Physical Therapy

## 2011-05-14 ENCOUNTER — Other Ambulatory Visit: Payer: Self-pay | Admitting: Infectious Disease

## 2011-05-14 DIAGNOSIS — D72829 Elevated white blood cell count, unspecified: Secondary | ICD-10-CM

## 2011-05-15 ENCOUNTER — Ambulatory Visit (HOSPITAL_COMMUNITY)
Admission: RE | Admit: 2011-05-15 | Discharge: 2011-05-15 | Disposition: A | Discharge status: Home / Self Care | Payer: BC Managed Care – PPO | Source: Ambulatory Visit | Attending: Infectious Disease | Admitting: Infectious Disease

## 2011-05-15 DIAGNOSIS — R509 Fever, unspecified: Secondary | ICD-10-CM | POA: Insufficient documentation

## 2011-05-15 DIAGNOSIS — R51 Headache: Secondary | ICD-10-CM | POA: Insufficient documentation

## 2011-05-15 LAB — BASIC METABOLIC PANEL
BUN: 19 mg/dL (ref 6–23)
GFR calc Af Amer: >90 mL/min (ref >90)
GFR calc non Af Amer: 85 mL/min — ABNORMAL LOW (ref >90)
Potassium: 4 mEq/L (ref 3.5–5.1)

## 2011-05-15 MED ORDER — GADOBENATE DIMEGLUMINE 529 MG/ML IV SOLN
20.0000 mL | Freq: Once | INTRAVENOUS | Status: AC | PRN
  Administered 2011-05-15: 20 mL via INTRAVENOUS

## 2011-05-16 ENCOUNTER — Ambulatory Visit (HOSPITAL_COMMUNITY)
Admission: RE | Admit: 2011-05-16 | Discharge: 2011-05-16 | Disposition: A | Discharge status: Home / Self Care | Payer: BC Managed Care – PPO | Source: Ambulatory Visit | Attending: Infectious Disease | Admitting: Infectious Disease

## 2011-05-16 ENCOUNTER — Other Ambulatory Visit: Payer: Self-pay | Admitting: Radiology

## 2011-05-16 DIAGNOSIS — R51 Headache: Secondary | ICD-10-CM

## 2011-05-16 DIAGNOSIS — R519 Headache, unspecified: Secondary | ICD-10-CM

## 2011-05-16 LAB — CSF CELL COUNT WITH DIFFERENTIAL
RBC Count, CSF: 7 /mm3 — ABNORMAL HIGH
Segmented Neutrophils-CSF: NONE SEEN % (ref 0–6)
WBC, CSF: 2 /mm3 (ref 0–5)

## 2011-05-16 LAB — CRYPTOCOCCAL ANTIGEN, CSF: Crypto Ag: NEGATIVE

## 2011-05-20 LAB — CSF CULTURE W GRAM STAIN: Gram Stain: NONE SEEN

## 2011-07-09 ENCOUNTER — Ambulatory Visit: Payer: BC Managed Care – PPO | Admitting: Infectious Disease

## 2011-07-21 ENCOUNTER — Ambulatory Visit (HOSPITAL_COMMUNITY)
Admission: RE | Admit: 2011-07-21 | Discharge: 2011-07-21 | Disposition: A | Discharge status: Home / Self Care | Payer: BC Managed Care – PPO | Source: Ambulatory Visit | Attending: Internal Medicine | Admitting: Internal Medicine

## 2011-07-21 ENCOUNTER — Other Ambulatory Visit (HOSPITAL_COMMUNITY): Payer: Self-pay | Admitting: Internal Medicine

## 2011-07-21 DIAGNOSIS — R7611 Nonspecific reaction to tuberculin skin test without active tuberculosis: Secondary | ICD-10-CM

## 2011-07-21 DIAGNOSIS — I517 Cardiomegaly: Secondary | ICD-10-CM | POA: Insufficient documentation

## 2011-07-21 DIAGNOSIS — Z87891 Personal history of nicotine dependence: Secondary | ICD-10-CM | POA: Insufficient documentation

## 2011-07-29 ENCOUNTER — Ambulatory Visit (INDEPENDENT_AMBULATORY_CARE_PROVIDER_SITE_OTHER): Payer: BC Managed Care – PPO | Admitting: Infectious Disease

## 2011-07-29 ENCOUNTER — Encounter: Payer: Self-pay | Admitting: Infectious Disease

## 2011-07-29 VITALS — BP 109/72 | HR 76 | Temp 99.3°F | Ht 72.0 in | Wt 303.0 lb

## 2011-07-29 DIAGNOSIS — R509 Fever, unspecified: Secondary | ICD-10-CM

## 2011-07-29 DIAGNOSIS — R7611 Nonspecific reaction to tuberculin skin test without active tuberculosis: Secondary | ICD-10-CM | POA: Insufficient documentation

## 2011-07-29 LAB — COMPLETE METABOLIC PANEL WITH GFR
Albumin: 4.1 g/dL (ref 3.5–5.2)
CO2: 24 mEq/L (ref 19–32)
GFR, Est African American: >89 mL/min
GFR, Est Non African American: >89 mL/min
Glucose, Bld: 79 mg/dL (ref 70–99)
Sodium: 141 mEq/L (ref 135–145)
Total Bilirubin: 0.5 mg/dL (ref 0.3–1.2)
Total Protein: 6.5 g/dL (ref 6.0–8.3)
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
## 2011-07-29 NOTE — Assessment & Plan Note (Signed)
I suspect that his symptoms including his "fevers" are likely related to chronic fatigue. I have asked he be delayed tuberculosis for now and we do not entertain other interventions or workups for the time being.

## 2011-07-29 NOTE — Patient Instructions (Signed)
We need to make sure you get rx for Latent TB either via the health dept or from [place]. We need record of your positive skin test

## 2011-07-29 NOTE — Assessment & Plan Note (Signed)
All patients with either a positive TB skin test or a quad furuncle need to be treated for latent tuberculosis. Negativity on one test does not allow dismissal of  the positive value on the other.He has absolutely NO evidence of TB infection of lungs, brain, or other organ system. We asked for the records to be faxed to [place] and the health Department I would refer him to the health department for treatment for latent tuberculosis with isoniazid.

## 2011-07-30 LAB — CBC WITH DIFFERENTIAL/PLATELET
Eosinophils Absolute: 0.2 10*3/uL (ref 0.0–0.7)
Eosinophils Relative: 2 % (ref 0–5)
HCT: 44.7 % (ref 39.0–52.0)
Hemoglobin: 14.7 g/dL (ref 13.0–17.0)
Lymphs Abs: 3.1 10*3/uL (ref 0.7–4.0)
MCH: 29.8 pg (ref 26.0–34.0)
MCV: 90.5 fL (ref 78.0–100.0)
Monocytes Relative: 9 % (ref 3–12)
Neutrophils Relative %: 61 % (ref 43–77)
RBC: 4.94 MIL/uL (ref 4.22–5.81)

## 2011-08-01 ENCOUNTER — Telehealth: Payer: Self-pay | Admitting: Licensed Clinical Social Worker

## 2011-08-01 NOTE — Telephone Encounter (Signed)
Patient called inquiring about if he should go to the Health Department or not. I told him I would check with Dr. Daiva Eves and call him later on this afternoon.

## 2011-08-06 ENCOUNTER — Telehealth: Payer: Self-pay | Admitting: *Deleted

## 2011-08-06 ENCOUNTER — Ambulatory Visit (INDEPENDENT_AMBULATORY_CARE_PROVIDER_SITE_OTHER): Payer: BC Managed Care – PPO | Admitting: Licensed Clinical Social Worker

## 2011-08-06 DIAGNOSIS — R7611 Nonspecific reaction to tuberculin skin test without active tuberculosis: Secondary | ICD-10-CM

## 2011-08-06 NOTE — Telephone Encounter (Signed)
Pt called to follow-up on the status of referral to [facility]. Health Department.   RN spoke with T. Marcell Barlow who will find out the status of referral.

## 2011-08-07 NOTE — Telephone Encounter (Signed)
Per Dr. Daiva Eves need to have tb skin test placed again. Patient came in yesterday and will return tomorrow to be read.

## 2011-08-11 ENCOUNTER — Ambulatory Visit: Payer: BC Managed Care – PPO

## 2011-09-23 NOTE — Telephone Encounter (Signed)
Patient had a negative tb skin test so he will not have to go to the health department.

## 2013-04-13 ENCOUNTER — Encounter: Payer: Self-pay | Admitting: Internal Medicine

## 2013-04-14 ENCOUNTER — Ambulatory Visit: Payer: BC Managed Care – PPO | Admitting: Emergency Medicine

## 2013-04-14 ENCOUNTER — Encounter: Payer: Self-pay | Admitting: Emergency Medicine

## 2013-04-14 VITALS — BP 110/60 | HR 70 | Temp 99.1°F | Resp 18 | Ht 72.5 in | Wt 326.0 lb

## 2013-04-14 DIAGNOSIS — I10 Essential (primary) hypertension: Secondary | ICD-10-CM

## 2013-04-14 DIAGNOSIS — R7309 Other abnormal glucose: Secondary | ICD-10-CM

## 2013-04-14 DIAGNOSIS — E669 Obesity, unspecified: Secondary | ICD-10-CM

## 2013-04-14 DIAGNOSIS — E782 Mixed hyperlipidemia: Secondary | ICD-10-CM

## 2013-04-14 DIAGNOSIS — E559 Vitamin D deficiency, unspecified: Secondary | ICD-10-CM

## 2013-04-14 LAB — BASIC METABOLIC PANEL WITH GFR
BUN: 14 mg/dL (ref 6–23)
CO2: 28 mEq/L (ref 19–32)
Calcium: 9.7 mg/dL (ref 8.4–10.5)
Creat: 0.85 mg/dL (ref 0.50–1.35)
Glucose, Bld: 89 mg/dL (ref 70–99)

## 2013-04-14 LAB — CBC WITH DIFFERENTIAL/PLATELET
Basophils Absolute: 0.1 10*3/uL (ref 0.0–0.1)
Eosinophils Absolute: 0.2 10*3/uL (ref 0.0–0.7)
Eosinophils Relative: 2 % (ref 0–5)
Hemoglobin: 15.9 g/dL (ref 13.0–17.0)
Lymphs Abs: 3 10*3/uL (ref 0.7–4.0)
MCH: 30.5 pg (ref 26.0–34.0)
Neutro Abs: 5.8 10*3/uL (ref 1.7–7.7)
Neutrophils Relative %: 58 % (ref 43–77)
Platelets: 335 10*3/uL (ref 150–400)
RBC: 5.22 MIL/uL (ref 4.22–5.81)
WBC: 10 10*3/uL (ref 4.0–10.5)

## 2013-04-14 LAB — HEPATIC FUNCTION PANEL
ALT: 33 U/L (ref 0–53)
AST: 19 U/L (ref 0–37)
Albumin: 4 g/dL (ref 3.5–5.2)
Total Protein: 6.5 g/dL (ref 6.0–8.3)

## 2013-04-14 LAB — MAGNESIUM: Magnesium: 1.6 mg/dL (ref 1.5–2.5)

## 2013-04-14 LAB — LIPID PANEL: Cholesterol: 154 mg/dL (ref 0–200)

## 2013-04-14 LAB — HEMOGLOBIN A1C: Mean Plasma Glucose: 111 mg/dL (ref <117)

## 2013-04-14 MED ORDER — METFORMIN HCL 500 MG PO TABS: 500.0000 mg | ORAL_TABLET | Freq: Two times a day (BID) | ORAL | Status: DC

## 2013-04-14 NOTE — Patient Instructions (Addendum)
Obesity Obesity is having too much body fat and a body mass index (BMI) of 30 or more. BMI is a number based on your height and weight. The number is an estimate of how much body fat you have. Obesity can happen if you eat more calories than you can burn by exercising or other activity. It can cause major health problems or emergencies.  HOME CARE  Exercise and be active as told by your doctor. Try:  Using stairs when you can.  Parking farther away from store doors.  Gardening, biking, or walking.  Eat healthy foods and drinks that are low in calories. Eat more fruits and vegetables.  Limit fast food, sweets, and snack foods that are made with ingredients that are not natural (processed food).  Eat smaller amounts of food.  Keep a journal and write down what you eat every day. Websites can help with this.  Avoid drinking alcohol. Drink more water and drinks without calories.   Take vitamins and dietary pills (supplements) only as told by your doctor.  Try going to weight-loss support groups or classes to help lessen stress. Dieticians and counselors may also help. GET HELP RIGHT AWAY IF:  You have chest pain or tightness.  You have trouble breathing or feel short of breath.  You feel weak or have loss of feeling (numbness) in your legs.  You feel confused or have trouble talking.  You have sudden changes in your vision. MAKE SURE YOU:  Understand these instructions.  Will watch your condition.  Will get help right away if you are not doing well or get worse. Document Released: 08/04/2011 Document Reviewed: 08/04/2011 [facility] Patient Information 2014 [place], [place]. Diabetes Meal Planning Guide The diabetes meal planning guide is a tool to help you plan your meals and snacks. It is important for people with diabetes to manage their blood glucose (sugar) levels. Choosing the right foods and the right amounts throughout your day will help control your blood glucose.  Eating right can even help you improve your blood pressure and reach or maintain a healthy weight. CARBOHYDRATE COUNTING MADE EASY When you eat carbohydrates, they turn to sugar. This raises your blood glucose level. Counting carbohydrates can help you control this level so you feel better. When you plan your meals by counting carbohydrates, you can have more flexibility in what you eat and balance your medicine with your food intake. Carbohydrate counting simply means adding up the total amount of carbohydrate grams in your meals and snacks. Try to eat about the same amount at each meal. Foods with carbohydrates are listed below. Each portion below is 1 carbohydrate serving or 15 grams of carbohydrates. Ask your dietician how many grams of carbohydrates you should eat at each meal or snack. Grains and Starches  1 slice bread.   English muffin or hotdog/hamburger bun.   cup cold cereal (unsweetened).   cup cooked pasta or rice.   cup starchy vegetables (corn, potatoes, peas, beans, winter squash).  1 tortilla (6 inches).   bagel.  1 waffle or pancake (size of a CD).   cup cooked cereal.  4 to 6 small crackers. *Whole grain is recommended. Fruit  1 cup fresh unsweetened berries, melon, papaya, pineapple.  1 small fresh fruit.   banana or mango.   cup fruit juice (4 oz unsweetened).   cup canned fruit in natural juice or water.  2 tbs dried fruit.  12 to 15 grapes or cherries. Milk and Yogurt  1 cup  fat-free or 1% milk.  1 cup soy milk.  6 oz light yogurt with sugar-free sweetener.  6 oz low-fat soy yogurt.  6 oz plain yogurt. Vegetables  1 cup raw or  cup cooked is counted as 0 carbohydrates or a "free" food.  If you eat 3 or more servings at 1 meal, count them as 1 carbohydrate serving. Other Carbohydrates   oz chips or pretzels.   cup ice cream or frozen yogurt.   cup sherbet or sorbet.  2 inch square cake, no frosting.  1 tbs honey,  sugar, jam, jelly, or syrup.  2 small cookies.  3 squares of graham crackers.  3 cups popcorn.  6 crackers.  1 cup broth-based soup.  Count 1 cup casserole or other mixed foods as 2 carbohydrate servings.  Foods with less than 20 calories in a serving may be counted as 0 carbohydrates or a "free" food. You may want to purchase a book or computer software that lists the carbohydrate gram counts of different foods. In addition, the nutrition facts panel on the labels of the foods you eat are a good source of this information. The label will tell you how big the serving size is and the total number of carbohydrate grams you will be eating per serving. Divide this number by 15 to obtain the number of carbohydrate servings in a portion. Remember, 1 carbohydrate serving equals 15 grams of carbohydrate. SERVING SIZES Measuring foods and serving sizes helps you make sure you are getting the right amount of food. The list below tells how big or small some common serving sizes are.  1 oz.........4 stacked dice.  3 oz........Marland KitchenDeck of cards.  1 tsp.......Marland KitchenTip of little finger.  1 tbs......Marland KitchenMarland KitchenThumb.  2 tbs.......Marland KitchenGolf ball.   cup......Marland KitchenHalf of a fist.  1 cup.......Marland KitchenA fist. SAMPLE DIABETES MEAL PLAN Below is a sample meal plan that includes foods from the grain and starches, dairy, vegetable, fruit, and meat groups. A dietician can individualize a meal plan to fit your calorie needs and tell you the number of servings needed from each food group. However, controlling the total amount of carbohydrates in your meal or snack is more important than making sure you include all of the food groups at every meal. You may interchange carbohydrate containing foods (dairy, starches, and fruits). The meal plan below is an example of a 2000 calorie diet using carbohydrate counting. This meal plan has 17 carbohydrate servings. Breakfast  1 cup oatmeal (2 carb servings).   cup light yogurt (1 carb  serving).  1 cup blueberries (1 carb serving).   cup almonds. Snack  1 large apple (2 carb servings).  1 low-fat string cheese stick. Lunch  Chicken breast salad.  1 cup spinach.   cup chopped tomatoes.  2 oz chicken breast, sliced.  2 tbs low-fat [place] dressing.  12 whole-wheat crackers (2 carb servings).  12 to 15 grapes (1 carb serving).  1 cup low-fat milk (1 carb serving). Snack  1 cup carrots.   cup hummus (1 carb serving). Dinner  3 oz broiled salmon.  1 cup brown rice (3 carb servings). Snack  1  cups steamed broccoli (1 carb serving) drizzled with 1 tsp olive oil and lemon juice.  1 cup light pudding (2 carb servings). DIABETES MEAL PLANNING WORKSHEET Your dietician can use this worksheet to help you decide how many servings of foods and what types of foods are right for you.  BREAKFAST Food Group and Servings / Carb Servings Grain/Starches  __________________________________ Dairy __________________________________________ Vegetable ______________________________________ Fruit ___________________________________________ Meat __________________________________________ Fat ____________________________________________ LUNCH Food Group and Servings / Carb Servings Grain/Starches ___________________________________ Dairy ___________________________________________ Fruit ____________________________________________ Meat ___________________________________________ Fat _____________________________________________ Laural Golden Food Group and Servings / Carb Servings Grain/Starches ___________________________________ Dairy ___________________________________________ Fruit ____________________________________________ Meat ___________________________________________ Fat _____________________________________________ SNACKS Food Group and Servings / Carb Servings Grain/Starches ___________________________________ Dairy  ___________________________________________ Vegetable _______________________________________ Fruit ____________________________________________ Meat ___________________________________________ Fat _____________________________________________ DAILY TOTALS Starches _________________________ Vegetable ________________________ Fruit ____________________________ Dairy ____________________________ Meat ____________________________ Fat ______________________________ Document Released: 02/06/2005 Document Revised: 08/04/2011 Document Reviewed: 12/18/2008 [employer] Patient Information 2014 [employer].

## 2013-04-15 LAB — TSH: TSH: 0.885 u[IU]/mL (ref 0.350–4.500)
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
## 2013-05-25 ENCOUNTER — Other Ambulatory Visit: Payer: Self-pay | Admitting: Internal Medicine

## 2013-06-13 ENCOUNTER — Other Ambulatory Visit: Payer: Self-pay | Admitting: Physician Assistant

## 2013-06-13 MED ORDER — ALBUTEROL SULFATE HFA 108 (90 BASE) MCG/ACT IN AERS: 2.0000 | INHALATION_SPRAY | RESPIRATORY_TRACT | Status: DC | PRN

## 2013-07-14 ENCOUNTER — Ambulatory Visit (INDEPENDENT_AMBULATORY_CARE_PROVIDER_SITE_OTHER): Payer: BC Managed Care – PPO | Admitting: Podiatrist

## 2013-07-14 ENCOUNTER — Encounter: Payer: Self-pay | Admitting: Podiatrist

## 2013-07-14 ENCOUNTER — Ambulatory Visit (INDEPENDENT_AMBULATORY_CARE_PROVIDER_SITE_OTHER): Payer: BC Managed Care – PPO

## 2013-07-14 VITALS — BP 115/71 | HR 81 | Resp 16 | Ht 72.0 in | Wt 300.0 lb

## 2013-07-14 DIAGNOSIS — M79609 Pain in unspecified limb: Secondary | ICD-10-CM

## 2013-07-14 DIAGNOSIS — M79673 Pain in unspecified foot: Secondary | ICD-10-CM

## 2013-07-14 DIAGNOSIS — L03039 Cellulitis of unspecified toe: Secondary | ICD-10-CM

## 2013-07-14 DIAGNOSIS — M722 Plantar fascial fibromatosis: Secondary | ICD-10-CM

## 2013-07-14 DIAGNOSIS — L600 Ingrowing nail: Secondary | ICD-10-CM

## 2013-07-14 MED ORDER — CEPHALEXIN 500 MG PO CAPS: 500.0000 mg | ORAL_CAPSULE | Freq: Three times a day (TID) | ORAL | Status: DC

## 2013-07-14 NOTE — Patient Instructions (Signed)
ANTIBACTERIAL SOAP INSTRUCTIONS  THE DAY AFTER PROCEDURE     Shower as usual. Before getting out, place a drop of antibacterial liquid soap (Dial) on a wet, clean washcloth.  Gently wipe washcloth over affected area.  Afterward, rinse the area with warm water.  Blot the area dry with a soft cloth and cover with antibiotic ointment (neosporin, polysporin, bacitracin) and band aid or gauze and tape  OR   Place 3-4 drops of antibacterial liquid soap in a quart of warm tap water.  Submerge foot into water for 20 minutes.  If bandage was applied after your procedure, leave on to allow for easy lift off, then remove and continue with soak for the remaining time.  Next, blot area dry with a soft cloth and cover with a bandage.  Apply other medications as directed by your doctor, such as cortisporin otic solution (eardrops) or neosporin antibiotic ointment   

## 2013-07-14 NOTE — Progress Notes (Signed)
   Subjective:    Patient ID: Mark Harrell, male    DOB: 08/07/1960, 53 y.o.   MRN: [id number]  HPI Comments: N pain L b/l heels, arch, under toes D early 1990's O sudden C worse at times A walking and standing T otc products, devices from a website, chiropractor would use a thumper thing on it, crack feet, stretches, had injections in b/l feet on 2 different occasions and had appr. 12 different sticks by a rheumatologist?    N PAIN OFF AND ON L B/L GREAT TOENAILS D 40 YEARS O SLOWLY C WORSE A GROWING OUT T PT CUTS TOENAILS, ALCOHOL    Foot Pain Associated symptoms include headaches, numbness and weakness.      Review of Systems  Constitutional: Positive for activity change.  HENT:       SINUS PROBLEMS RINGING IN EARS  Eyes: Positive for itching.  Respiratory: Positive for wheezing.   Cardiovascular: Negative.   Gastrointestinal: Negative.   Endocrine: Positive for heat intolerance.       INCREASE URINATION  Genitourinary: Positive for urgency.       FREQUENCY  Musculoskeletal: Positive for back pain.       JOINT PAIN BACK PAIN DIFFICULTY WALKING MUSCLE PAIN  Skin: Negative.   Allergic/Immunologic: Negative.   Neurological: Positive for weakness, numbness and headaches.  Hematological: Negative.   Psychiatric/Behavioral: Negative.        Objective:   Physical Exam GENERAL APPEARANCE: Alert, conversant. Appropriately groomed. No acute distress.  VASCULAR: Pedal pulses palpable at 2/4 DP and PT bilateral.  Capillary refill time is immediate to all digits,  Proximal to distal cooling it warm to warm.  Digital hair growth is present bilateral  NEUROLOGIC: sensation is intact epicritically and protectively to 5.07 monofilament at 5/5 sites bilateral.  Light touch is intact bilateral, vibratory sensation intact bilateral, achilles tendon reflex is intact bilateral.  MUSCULOSKELETAL: acceptable muscle strength, tone and stability bilateral.  Intrinsic muscluature  intact bilateral.  Rectus appearance of foot and digits noted bilateral. Mild pain on palpation bilateral heels at insertion of plantar fascia is noted.  Pain is not severe enough to warrant an injection today  DERMATOLOGIC: skin color, texture, and turger are within normal limits.  Bilateral hallux nails are incurvated and ingrown on both borders.  No pus or drainage is seen.  Pain with pressure along the nail beds is noted bilaterally.     Assessment & Plan:  Ingrown hallux nails bilateral;  Plantar fasciitis bilateral  Plan:  Treatment options and alternatives discussed.  Recommended permanent phenol matrixectomy and patient agreed.  Bilateral hallux nail was prepped with alcohol and a 1 to 1 mix of 0.5% marcaine plain and 2% lidocaine plain was administered in a digital block fashion.  The toe was then prepped with betadine solution and exsanguinated.  The offending nail border was then excised and matrix tissue exposed.  Phenol was then applied to the matrix tissue followed by an alcohol wash.  Antibiotic ointment and a dry sterile dressing was applied.  The patient was dispensed instructions for aftercare.    Patient was scanned for orthotics and will be notified when ready for pick up-  He will call if any problems or concerns arise or if the plantar fasciitis acts up and an injection is requested.

## 2013-07-21 ENCOUNTER — Encounter: Payer: Self-pay | Admitting: Internal Medicine

## 2013-07-23 ENCOUNTER — Other Ambulatory Visit: Payer: Self-pay | Admitting: Internal Medicine

## 2013-08-01 ENCOUNTER — Encounter: Payer: Self-pay | Admitting: Internal Medicine

## 2013-08-01 ENCOUNTER — Ambulatory Visit (INDEPENDENT_AMBULATORY_CARE_PROVIDER_SITE_OTHER): Payer: BC Managed Care – PPO | Admitting: Internal Medicine

## 2013-08-01 VITALS — BP 124/82 | HR 72 | Temp 98.6°F | Resp 16 | Ht 72.0 in | Wt 340.8 lb

## 2013-08-01 DIAGNOSIS — Z1212 Encounter for screening for malignant neoplasm of rectum: Secondary | ICD-10-CM

## 2013-08-01 DIAGNOSIS — E291 Testicular hypofunction: Secondary | ICD-10-CM

## 2013-08-01 DIAGNOSIS — Z23 Encounter for immunization: Secondary | ICD-10-CM

## 2013-08-01 DIAGNOSIS — R7402 Elevation of levels of lactic acid dehydrogenase (LDH): Secondary | ICD-10-CM

## 2013-08-01 DIAGNOSIS — R740 Nonspecific elevation of levels of transaminase and lactic acid dehydrogenase [LDH]: Secondary | ICD-10-CM

## 2013-08-01 DIAGNOSIS — E559 Vitamin D deficiency, unspecified: Secondary | ICD-10-CM

## 2013-08-01 DIAGNOSIS — R7401 Elevation of levels of liver transaminase levels: Secondary | ICD-10-CM

## 2013-08-01 DIAGNOSIS — I10 Essential (primary) hypertension: Secondary | ICD-10-CM

## 2013-08-01 DIAGNOSIS — Z125 Encounter for screening for malignant neoplasm of prostate: Secondary | ICD-10-CM

## 2013-08-01 DIAGNOSIS — Z79899 Other long term (current) drug therapy: Secondary | ICD-10-CM

## 2013-08-01 DIAGNOSIS — R7303 Prediabetes: Secondary | ICD-10-CM | POA: Insufficient documentation

## 2013-08-01 DIAGNOSIS — Z0000 Encounter for general adult medical examination without abnormal findings: Secondary | ICD-10-CM

## 2013-08-01 DIAGNOSIS — E119 Type 2 diabetes mellitus without complications: Secondary | ICD-10-CM

## 2013-08-01 DIAGNOSIS — Z113 Encounter for screening for infections with a predominantly sexual mode of transmission: Secondary | ICD-10-CM

## 2013-08-01 MED ORDER — AZITHROMYCIN 250 MG PO TABS: ORAL_TABLET | ORAL | Status: DC

## 2013-08-01 NOTE — Patient Instructions (Signed)
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
## 2013-08-02 LAB — HEPATIC FUNCTION PANEL
ALT: 31 U/L (ref 0–53)
AST: 19 U/L (ref 0–37)
Albumin: 4 g/dL (ref 3.5–5.2)
Alkaline Phosphatase: 68 U/L (ref 39–117)
BILIRUBIN DIRECT: 0.2 mg/dL (ref 0.0–0.3)
Indirect Bilirubin: 1.1 mg/dL (ref 0.2–1.2)
Total Bilirubin: 1.3 mg/dL — ABNORMAL HIGH (ref 0.2–1.2)
Total Protein: 6.7 g/dL (ref 6.0–8.3)

## 2013-08-02 LAB — BASIC METABOLIC PANEL WITH GFR
BUN: 14 mg/dL (ref 6–23)
CO2: 26 mEq/L (ref 19–32)
CREATININE: 0.87 mg/dL (ref 0.50–1.35)
Calcium: 9 mg/dL (ref 8.4–10.5)
Chloride: 101 mEq/L (ref 96–112)
GFR, Est Non African American: >89 mL/min
GLUCOSE: 85 mg/dL (ref 70–99)
POTASSIUM: 4.6 meq/L (ref 3.5–5.3)
Sodium: 139 mEq/L (ref 135–145)

## 2013-08-02 LAB — MICROALBUMIN / CREATININE URINE RATIO
Creatinine, Urine: 262.3 mg/dL
MICROALB UR: 0.68 mg/dL (ref 0.00–1.89)
Microalb Creat Ratio: 2.6 mg/g (ref 0.0–30.0)

## 2013-08-02 LAB — CBC WITH DIFFERENTIAL/PLATELET
BASOS PCT: 1 % (ref 0–1)
Basophils Absolute: 0.1 10*3/uL (ref 0.0–0.1)
EOS ABS: 0.3 10*3/uL (ref 0.0–0.7)
Eosinophils Relative: 3 % (ref 0–5)
HEMATOCRIT: 49.1 % (ref 39.0–52.0)
HEMOGLOBIN: 16 g/dL (ref 13.0–17.0)
Lymphocytes Relative: 33 % (ref 12–46)
Lymphs Abs: 3.1 10*3/uL (ref 0.7–4.0)
MCH: 29.9 pg (ref 26.0–34.0)
MCHC: 32.6 g/dL (ref 30.0–36.0)
MCV: 91.6 fL (ref 78.0–100.0)
MONO ABS: 0.9 10*3/uL (ref 0.1–1.0)
Monocytes Relative: 10 % (ref 3–12)
NEUTROS PCT: 53 % (ref 43–77)
Neutro Abs: 4.9 10*3/uL (ref 1.7–7.7)
Platelets: 317 10*3/uL (ref 150–400)
RBC: 5.36 MIL/uL (ref 4.22–5.81)
RDW: 13.4 % (ref 11.5–15.5)
WBC: 9.3 10*3/uL (ref 4.0–10.5)

## 2013-08-02 LAB — INSULIN, FASTING: Insulin fasting, serum: 24 u[IU]/mL (ref 3–28)

## 2013-08-02 LAB — TSH: TSH: 1.19 u[IU]/mL (ref 0.350–4.500)

## 2013-08-02 LAB — HEMOGLOBIN A1C
Hgb A1c MFr Bld: 5.6 % (ref <5.7)
MEAN PLASMA GLUCOSE: 114 mg/dL (ref <117)

## 2013-08-02 LAB — LIPID PANEL
Cholesterol: 146 mg/dL (ref 0–200)
HDL: 33 mg/dL — ABNORMAL LOW (ref >39)
LDL CALC: 50 mg/dL (ref 0–99)
TRIGLYCERIDES: 313 mg/dL — AB (ref <150)
Total CHOL/HDL Ratio: 4.4 Ratio
VLDL: 63 mg/dL — ABNORMAL HIGH (ref 0–40)

## 2013-08-02 LAB — HEPATITIS A ANTIBODY, TOTAL: Hep A Total Ab: REACTIVE — AB

## 2013-08-02 LAB — HEPATITIS B SURFACE ANTIBODY,QUALITATIVE: Hep B S Ab: NEGATIVE

## 2013-08-02 LAB — TESTOSTERONE: Testosterone: 127 ng/dL — ABNORMAL LOW (ref 300–890)

## 2013-08-02 LAB — RPR

## 2013-08-02 LAB — HEPATITIS B CORE ANTIBODY, TOTAL: HEP B C TOTAL AB: NONREACTIVE

## 2013-08-02 LAB — URINALYSIS, MICROSCOPIC ONLY
BACTERIA UA: NONE SEEN
CASTS: NONE SEEN
Crystals: NONE SEEN
Squamous Epithelial / LPF: NONE SEEN

## 2013-08-02 LAB — VITAMIN D 25 HYDROXY (VIT D DEFICIENCY, FRACTURES): Vit D, 25-Hydroxy: 82 ng/mL (ref 30–89)

## 2013-08-02 LAB — HEPATITIS C ANTIBODY: HCV Ab: NEGATIVE

## 2013-08-02 LAB — MAGNESIUM: Magnesium: 1.9 mg/dL (ref 1.5–2.5)

## 2013-08-02 LAB — VITAMIN B12: Vitamin B-12: 1116 pg/mL — ABNORMAL HIGH (ref 211–911)

## 2013-08-05 LAB — HEPATITIS B E ANTIBODY: Hepatitis Be Antibody: NONREACTIVE

## 2013-09-16 ENCOUNTER — Ambulatory Visit (INDEPENDENT_AMBULATORY_CARE_PROVIDER_SITE_OTHER): Payer: Self-pay | Admitting: *Deleted

## 2013-09-16 DIAGNOSIS — M722 Plantar fascial fibromatosis: Secondary | ICD-10-CM

## 2013-09-16 NOTE — Patient Instructions (Signed)

## 2013-09-16 NOTE — Progress Notes (Signed)
   Subjective:    Patient ID: Mark Harrell, male    DOB: 1960-07-27, 53 y.o.   MRN: [id number]  HPI PICK UP ORTHOTICS AND GIVEN INSTRUCTION.    Review of Systems     Objective:   Physical Exam        Assessment & Plan:

## 2013-10-14 ENCOUNTER — Encounter: Payer: Self-pay | Admitting: Podiatrist

## 2013-10-14 ENCOUNTER — Ambulatory Visit (INDEPENDENT_AMBULATORY_CARE_PROVIDER_SITE_OTHER): Payer: Self-pay | Admitting: Podiatrist

## 2013-10-14 VITALS — BP 104/67 | HR 90 | Resp 18

## 2013-10-14 DIAGNOSIS — M722 Plantar fascial fibromatosis: Secondary | ICD-10-CM

## 2013-10-15 NOTE — Progress Notes (Signed)
Mark Harrell presents for follow up of orthotics and heel pain. He states that overall the orthotics are working out well.  He is not having any concerns and has been wearing them daily as instructed.  Orthotics are noted to fit and contour the arch and and foot nicely. The topcover is intact and minimal signs of normal wear are noted.  Assessment:  Plantar fasciitis bilateral  Plan:  Discussed if the orthotic become uncomfortable or need adjusting or a new topcover, he will let me know.  Otherwise he will be seen back if any problems occur in the future.

## 2013-11-02 ENCOUNTER — Ambulatory Visit (INDEPENDENT_AMBULATORY_CARE_PROVIDER_SITE_OTHER): Payer: BC Managed Care – PPO | Admitting: Physician Assistant

## 2013-11-02 ENCOUNTER — Encounter: Payer: Self-pay | Admitting: Physician Assistant

## 2013-11-02 VITALS — BP 122/80 | HR 76 | Temp 97.9°F | Resp 16 | Wt 342.0 lb

## 2013-11-02 DIAGNOSIS — K117 Disturbances of salivary secretion: Secondary | ICD-10-CM

## 2013-11-02 DIAGNOSIS — R5383 Other fatigue: Secondary | ICD-10-CM

## 2013-11-02 DIAGNOSIS — F329 Major depressive disorder, single episode, unspecified: Secondary | ICD-10-CM

## 2013-11-02 DIAGNOSIS — R682 Dry mouth, unspecified: Secondary | ICD-10-CM

## 2013-11-02 DIAGNOSIS — J449 Chronic obstructive pulmonary disease, unspecified: Secondary | ICD-10-CM

## 2013-11-02 DIAGNOSIS — R6889 Other general symptoms and signs: Secondary | ICD-10-CM

## 2013-11-02 DIAGNOSIS — E785 Hyperlipidemia, unspecified: Secondary | ICD-10-CM

## 2013-11-02 DIAGNOSIS — Z79899 Other long term (current) drug therapy: Secondary | ICD-10-CM

## 2013-11-02 DIAGNOSIS — I10 Essential (primary) hypertension: Secondary | ICD-10-CM

## 2013-11-02 DIAGNOSIS — F32A Depression, unspecified: Secondary | ICD-10-CM

## 2013-11-02 DIAGNOSIS — E291 Testicular hypofunction: Secondary | ICD-10-CM

## 2013-11-02 DIAGNOSIS — R5381 Other malaise: Secondary | ICD-10-CM

## 2013-11-02 DIAGNOSIS — E119 Type 2 diabetes mellitus without complications: Secondary | ICD-10-CM

## 2013-11-02 DIAGNOSIS — E559 Vitamin D deficiency, unspecified: Secondary | ICD-10-CM

## 2013-11-02 DIAGNOSIS — F3289 Other specified depressive episodes: Secondary | ICD-10-CM

## 2013-11-02 LAB — CBC WITH DIFFERENTIAL/PLATELET
BASOS ABS: 0.1 10*3/uL (ref 0.0–0.1)
Basophils Relative: 1 % (ref 0–1)
Eosinophils Absolute: 0.2 10*3/uL (ref 0.0–0.7)
Eosinophils Relative: 2 % (ref 0–5)
HEMATOCRIT: 48.7 % (ref 39.0–52.0)
Hemoglobin: 16.5 g/dL (ref 13.0–17.0)
LYMPHS PCT: 36 % (ref 12–46)
Lymphs Abs: 3.5 10*3/uL (ref 0.7–4.0)
MCH: 29.8 pg (ref 26.0–34.0)
MCHC: 33.9 g/dL (ref 30.0–36.0)
MCV: 87.9 fL (ref 78.0–100.0)
MONO ABS: 1.2 10*3/uL — AB (ref 0.1–1.0)
Monocytes Relative: 12 % (ref 3–12)
NEUTROS ABS: 4.7 10*3/uL (ref 1.7–7.7)
NEUTROS PCT: 49 % (ref 43–77)
Platelets: 352 10*3/uL (ref 150–400)
RBC: 5.54 MIL/uL (ref 4.22–5.81)
RDW: 13.7 % (ref 11.5–15.5)
WBC: 9.6 10*3/uL (ref 4.0–10.5)
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
## 2013-11-02 NOTE — Patient Instructions (Signed)
Sleep Apnea   Sleep apnea is a sleep disorder characterized by abnormal pauses in breathing while you sleep. When your breathing pauses, the level of oxygen in your blood decreases. This causes you to move out of deep sleep and into light sleep. As a result, your quality of sleep is poor, and the system that carries your blood throughout your body (cardiovascular system) experiences stress. If sleep apnea remains untreated, the following conditions can develop:  · High blood pressure (hypertension).  · Coronary artery disease.  · Inability to achieve or maintain an erection (impotence).  · Impairment of your thought process (cognitive dysfunction).  There are three types of sleep apnea:  1. Obstructive sleep apnea Pauses in breathing during sleep because of a blocked airway.  2. Central sleep apnea Pauses in breathing during sleep because the area of the brain that controls your breathing does not send the correct signals to the muscles that control breathing.  3. Mixed sleep apnea A combination of both obstructive and central sleep apnea.  RISK FACTORS  The following risk factors can increase your risk of developing sleep apnea:  · Being overweight.  · Smoking.  · Having narrow passages in your nose and throat.  · Being of older age.  · Being male.  · Alcohol use.  · Sedative and tranquilizer use.  · Ethnicity. Among individuals younger than 35 years, African Americans are at increased risk of sleep apnea.  SYMPTOMS   · Difficulty staying asleep.  · Daytime sleepiness and fatigue.  · Loss of energy.  · Irritability.  · Loud, heavy snoring.  · Morning headaches.  · Trouble concentrating.  · Forgetfulness.  · Decreased interest in sex.  DIAGNOSIS   In order to diagnose sleep apnea, your caregiver will perform a physical examination. Your caregiver may suggest that you take a home sleep test. Your caregiver may also recommend that you spend the night in a sleep lab. In the sleep lab, several monitors record  information about your heart, lungs, and brain while you sleep. Your leg and arm movements and blood oxygen level are also recorded.  TREATMENT  The following actions may help to resolve mild sleep apnea:  · Sleeping on your side.    · Using a decongestant if you have nasal congestion.    · Avoiding the use of depressants, including alcohol, sedatives, and narcotics.    · Losing weight and modifying your diet if you are overweight.  There also are devices and treatments to help open your airway:  · Oral appliances. These are custom-made mouthpieces that shift your lower jaw forward and slightly open your bite. This opens your airway.  · Devices that create positive airway pressure. This positive pressure "splints" your airway open to help you breathe better during sleep. The following devices create positive airway pressure:  · Continuous positive airway pressure (CPAP) device. The CPAP device creates a continuous level of air pressure with an air pump. The air is delivered to your airway through a mask while you sleep. This continuous pressure keeps your airway open.  · Nasal expiratory positive airway pressure (EPAP) device. The EPAP device creates positive air pressure as you exhale. The device consists of single-use valves, which are inserted into each nostril and held in place by adhesive. The valves create very little resistance when you inhale but create much more resistance when you exhale. That increased resistance creates the positive airway pressure. This positive pressure while you exhale keeps your airway open, making it easier   continuous air pressure through a mask. However, with the BPAP machine, the pressure is set at two different levels. The pressure when you  exhale is lower than the pressure when you inhale.  Surgery. Typically, surgery is only done if you cannot comply with less invasive treatments or if the less invasive treatments do not improve your condition. Surgery involves removing excess tissue in your airway to create a wider passage way. Document Released: 05/02/2002 Document Revised: 09/06/2012 Document Reviewed: 09/18/2011 [employer] Patient Information 2014 [employer].    Bad carbs also include fruit juice, alcohol, and sweet tea. These are empty calories that do not signal to your brain that you are full.   Please remember the good carbs are still carbs which convert into sugar. So please measure them out no more than 1/2-1 cup of rice, oatmeal, pasta, and beans.  Veggies are however free foods! Pile them on.   I like lean protein at every meal such as chicken, turkey, pork chops, cottage cheese, etc. Just do not fry these meats and please center your meal around vegetable, the meats should be a side dish.   No all fruit is created equal. Please see the list below, the fruit at the bottom is higher in sugars than the fruit at the top      

## 2013-11-03 LAB — LIPID PANEL
Cholesterol: 162 mg/dL (ref 0–200)
HDL: 35 mg/dL — AB (ref >39)
LDL Cholesterol: 80 mg/dL (ref 0–99)
TRIGLYCERIDES: 236 mg/dL — AB (ref <150)
Total CHOL/HDL Ratio: 4.6 Ratio
VLDL: 47 mg/dL — ABNORMAL HIGH (ref 0–40)

## 2013-11-03 LAB — HEPATIC FUNCTION PANEL
ALT: 39 U/L (ref 0–53)
AST: 21 U/L (ref 0–37)
Albumin: 4 g/dL (ref 3.5–5.2)
Alkaline Phosphatase: 83 U/L (ref 39–117)
BILIRUBIN INDIRECT: 0.8 mg/dL (ref 0.2–1.2)
BILIRUBIN TOTAL: 0.9 mg/dL (ref 0.2–1.2)
Bilirubin, Direct: 0.1 mg/dL (ref 0.0–0.3)
Total Protein: 6.7 g/dL (ref 6.0–8.3)

## 2013-11-03 LAB — BASIC METABOLIC PANEL WITH GFR
BUN: 10 mg/dL (ref 6–23)
CO2: 24 mEq/L (ref 19–32)
Calcium: 9 mg/dL (ref 8.4–10.5)
Chloride: 102 mEq/L (ref 96–112)
Creat: 0.76 mg/dL (ref 0.50–1.35)
Glucose, Bld: 123 mg/dL — ABNORMAL HIGH (ref 70–99)
POTASSIUM: 4.5 meq/L (ref 3.5–5.3)
SODIUM: 139 meq/L (ref 135–145)

## 2013-11-03 LAB — INSULIN, FASTING: Insulin fasting, serum: 291 u[IU]/mL — ABNORMAL HIGH (ref 3–28)

## 2013-11-03 LAB — TSH: TSH: 0.939 u[IU]/mL (ref 0.350–4.500)

## 2013-11-03 LAB — MAGNESIUM: MAGNESIUM: 2.2 mg/dL (ref 1.5–2.5)

## 2013-11-03 LAB — HEMOGLOBIN A1C
Hgb A1c MFr Bld: 5.7 % — ABNORMAL HIGH (ref <5.7)
Mean Plasma Glucose: 117 mg/dL — ABNORMAL HIGH (ref <117)

## 2013-11-03 LAB — SJOGRENS SYNDROME-A EXTRACTABLE NUCLEAR ANTIBODY: SSA (Ro) (ENA) Antibody, IgG: <1

## 2013-11-03 LAB — ANA: Anti Nuclear Antibody(ANA): NEGATIVE

## 2013-11-03 LAB — VITAMIN D 25 HYDROXY (VIT D DEFICIENCY, FRACTURES): Vit D, 25-Hydroxy: 76 ng/mL (ref 30–89)

## 2013-11-03 LAB — SJOGRENS SYNDROME-B EXTRACTABLE NUCLEAR ANTIBODY: SSB (La) (ENA) Antibody, IgG: <1

## 2013-11-03 LAB — ANTI-DNA ANTIBODY, DOUBLE-STRANDED: ds DNA Ab: <1 IU/mL

## 2013-11-16 ENCOUNTER — Ambulatory Visit: Payer: BC Managed Care – PPO | Admitting: Endocrinology

## 2013-11-21 ENCOUNTER — Ambulatory Visit (INDEPENDENT_AMBULATORY_CARE_PROVIDER_SITE_OTHER): Payer: BC Managed Care – PPO | Admitting: Endocrinology

## 2013-11-21 ENCOUNTER — Encounter: Payer: Self-pay | Admitting: Endocrinology

## 2013-11-21 VITALS — BP 118/70 | HR 88 | Temp 98.4°F | Resp 16 | Ht 72.0 in | Wt 345.4 lb

## 2013-11-21 DIAGNOSIS — E291 Testicular hypofunction: Secondary | ICD-10-CM

## 2013-11-21 DIAGNOSIS — R5381 Other malaise: Secondary | ICD-10-CM

## 2013-11-21 DIAGNOSIS — R5383 Other fatigue: Secondary | ICD-10-CM

## 2013-11-21 MED ORDER — TESTOSTERONE 20.25 MG/ACT (1.62%) TD GEL: TRANSDERMAL | Status: DC
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
## 2013-11-22 ENCOUNTER — Other Ambulatory Visit: Payer: Self-pay | Admitting: Physician Assistant

## 2013-11-22 LAB — CORTISOL: Cortisol, Plasma: 12.8 ug/dL

## 2013-11-22 LAB — LUTEINIZING HORMONE: LH: 3.47 m[IU]/mL (ref 1.50–9.30)

## 2013-11-22 LAB — T4, FREE: Free T4: 0.77 ng/dL (ref 0.60–1.60)

## 2013-11-23 LAB — INSULIN-LIKE GROWTH FACTOR: Somatomedin (IGF-I): 102 ng/mL (ref 55–213)

## 2013-11-23 LAB — TESTOSTERONE, FREE, TOTAL, SHBG
SEX HORMONE BINDING: 38 nmol/L (ref 13–71)
Testosterone, Free: 32.6 pg/mL — ABNORMAL LOW (ref 47.0–244.0)
Testosterone-% Free: 1.7 % (ref 1.6–2.9)
Testosterone: 188 ng/dL — ABNORMAL LOW (ref 300–890)

## 2013-11-23 LAB — PROLACTIN: PROLACTIN: 8.2 ng/mL (ref 2.1–17.1)

## 2014-01-10 ENCOUNTER — Other Ambulatory Visit: Payer: Self-pay | Admitting: *Deleted

## 2014-01-10 ENCOUNTER — Other Ambulatory Visit (INDEPENDENT_AMBULATORY_CARE_PROVIDER_SITE_OTHER): Payer: BC Managed Care – PPO

## 2014-01-10 DIAGNOSIS — E291 Testicular hypofunction: Secondary | ICD-10-CM

## 2014-01-10 LAB — TESTOSTERONE: TESTOSTERONE: 239.44 ng/dL — AB (ref 300.00–890.00)

## 2014-01-16 ENCOUNTER — Other Ambulatory Visit: Payer: Self-pay | Admitting: *Deleted

## 2014-01-16 ENCOUNTER — Ambulatory Visit (INDEPENDENT_AMBULATORY_CARE_PROVIDER_SITE_OTHER): Payer: BC Managed Care – PPO | Admitting: Endocrinology

## 2014-01-16 ENCOUNTER — Encounter: Payer: Self-pay | Admitting: Endocrinology

## 2014-01-16 VITALS — BP 144/96 | HR 96 | Temp 98.1°F | Resp 16 | Ht 72.0 in | Wt 359.0 lb

## 2014-01-16 DIAGNOSIS — E669 Obesity, unspecified: Secondary | ICD-10-CM

## 2014-01-16 DIAGNOSIS — E291 Testicular hypofunction: Secondary | ICD-10-CM

## 2014-01-16 MED ORDER — TESTOSTERONE 20.25 MG/ACT (1.62%) TD GEL: TRANSDERMAL | Status: DC

## 2014-01-16 NOTE — Patient Instructions (Signed)
Androgel 3 pumps on each arm
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
## 2014-02-01 ENCOUNTER — Ambulatory Visit (INDEPENDENT_AMBULATORY_CARE_PROVIDER_SITE_OTHER): Payer: BC Managed Care – PPO | Admitting: Physician Assistant

## 2014-02-01 ENCOUNTER — Encounter: Payer: Self-pay | Admitting: Physician Assistant

## 2014-02-01 VITALS — BP 112/70 | HR 66 | Temp 98.4°F | Resp 18 | Ht 72.5 in | Wt 359.0 lb

## 2014-02-01 DIAGNOSIS — F32A Depression, unspecified: Secondary | ICD-10-CM

## 2014-02-01 DIAGNOSIS — E559 Vitamin D deficiency, unspecified: Secondary | ICD-10-CM

## 2014-02-01 DIAGNOSIS — I10 Essential (primary) hypertension: Secondary | ICD-10-CM

## 2014-02-01 DIAGNOSIS — R7309 Other abnormal glucose: Secondary | ICD-10-CM

## 2014-02-01 DIAGNOSIS — E119 Type 2 diabetes mellitus without complications: Secondary | ICD-10-CM

## 2014-02-01 DIAGNOSIS — E291 Testicular hypofunction: Secondary | ICD-10-CM

## 2014-02-01 DIAGNOSIS — F3289 Other specified depressive episodes: Secondary | ICD-10-CM

## 2014-02-01 DIAGNOSIS — F329 Major depressive disorder, single episode, unspecified: Secondary | ICD-10-CM

## 2014-02-01 DIAGNOSIS — E785 Hyperlipidemia, unspecified: Secondary | ICD-10-CM

## 2014-02-01 DIAGNOSIS — Z79899 Other long term (current) drug therapy: Secondary | ICD-10-CM

## 2014-02-01 LAB — CBC WITH DIFFERENTIAL/PLATELET
Basophils Absolute: 0.1 10*3/uL (ref 0.0–0.1)
Basophils Relative: 1 % (ref 0–1)
Eosinophils Absolute: 0.2 10*3/uL (ref 0.0–0.7)
Eosinophils Relative: 2 % (ref 0–5)
HCT: 45.5 % (ref 39.0–52.0)
HEMOGLOBIN: 15.6 g/dL (ref 13.0–17.0)
LYMPHS ABS: 3.5 10*3/uL (ref 0.7–4.0)
Lymphocytes Relative: 36 % (ref 12–46)
MCH: 30.2 pg (ref 26.0–34.0)
MCHC: 34.3 g/dL (ref 30.0–36.0)
MCV: 88.2 fL (ref 78.0–100.0)
Monocytes Absolute: 0.9 10*3/uL (ref 0.1–1.0)
Monocytes Relative: 9 % (ref 3–12)
NEUTROS ABS: 5 10*3/uL (ref 1.7–7.7)
NEUTROS PCT: 52 % (ref 43–77)
Platelets: 302 10*3/uL (ref 150–400)
RBC: 5.16 MIL/uL (ref 4.22–5.81)
RDW: 13.7 % (ref 11.5–15.5)
WBC: 9.6 10*3/uL (ref 4.0–10.5)

## 2014-02-01 LAB — HEMOGLOBIN A1C
Hgb A1c MFr Bld: 5.7 % — ABNORMAL HIGH (ref <5.7)
MEAN PLASMA GLUCOSE: 117 mg/dL — AB (ref <117)

## 2014-02-01 MED ORDER — AZITHROMYCIN 250 MG PO TABS: ORAL_TABLET | ORAL | Status: AC

## 2014-02-01 MED ORDER — PROMETHAZINE-CODEINE 6.25-10 MG/5ML PO SYRP
5.0000 mL | ORAL_SOLUTION | Freq: Four times a day (QID) | ORAL | Status: DC | PRN
Start: 2014-02-01 — End: 2014-02-28

## 2014-02-01 MED ORDER — AZELASTINE HCL 0.1 % NA SOLN: 2.0000 | Freq: Two times a day (BID) | NASAL | Status: DC
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
## 2014-02-01 NOTE — Patient Instructions (Signed)
   Bad carbs also include fruit juice, alcohol, and sweet tea. These are empty calories that do not signal to your brain that you are full.   Please remember the good carbs are still carbs which convert into sugar. So please measure them out no more than 1/2-1 cup of rice, oatmeal, pasta, and beans.  Veggies are however free foods! Pile them on.   I like lean protein at every meal such as chicken, turkey, pork chops, cottage cheese, etc. Just do not fry these meats and please center your meal around vegetable, the meats should be a side dish.   No all fruit is created equal. Please see the list below, the fruit at the bottom is higher in sugars than the fruit at the top    

## 2014-02-02 LAB — LIPID PANEL
Cholesterol: 146 mg/dL (ref 0–200)
HDL: 34 mg/dL — ABNORMAL LOW (ref >39)
LDL CALC: 67 mg/dL (ref 0–99)
Total CHOL/HDL Ratio: 4.3 Ratio
Triglycerides: 225 mg/dL — ABNORMAL HIGH (ref <150)
VLDL: 45 mg/dL — AB (ref 0–40)

## 2014-02-02 LAB — INSULIN, FASTING: Insulin fasting, serum: 15.6 u[IU]/mL (ref 2.0–19.6)

## 2014-02-02 LAB — BASIC METABOLIC PANEL WITH GFR
BUN: 14 mg/dL (ref 6–23)
CHLORIDE: 105 meq/L (ref 96–112)
CO2: 26 mEq/L (ref 19–32)
Calcium: 9.8 mg/dL (ref 8.4–10.5)
Creat: 0.81 mg/dL (ref 0.50–1.35)
GFR, Est African American: >89 mL/min
GFR, Est Non African American: >89 mL/min
GLUCOSE: 93 mg/dL (ref 70–99)
POTASSIUM: 4.4 meq/L (ref 3.5–5.3)
SODIUM: 139 meq/L (ref 135–145)

## 2014-02-02 LAB — MAGNESIUM: Magnesium: 1.8 mg/dL (ref 1.5–2.5)

## 2014-02-02 LAB — HEPATIC FUNCTION PANEL
ALK PHOS: 64 U/L (ref 39–117)
ALT: 49 U/L (ref 0–53)
AST: 31 U/L (ref 0–37)
Albumin: 3.8 g/dL (ref 3.5–5.2)
BILIRUBIN DIRECT: 0.2 mg/dL (ref 0.0–0.3)
BILIRUBIN INDIRECT: 0.8 mg/dL (ref 0.2–1.2)
Total Bilirubin: 1 mg/dL (ref 0.2–1.2)
Total Protein: 6.4 g/dL (ref 6.0–8.3)

## 2014-02-02 LAB — TSH: TSH: 1.502 u[IU]/mL (ref 0.350–4.500)

## 2014-02-02 LAB — VITAMIN D 25 HYDROXY (VIT D DEFICIENCY, FRACTURES): Vit D, 25-Hydroxy: 65 ng/mL (ref 30–89)

## 2014-02-06 ENCOUNTER — Other Ambulatory Visit: Payer: Self-pay | Admitting: Emergency Medicine

## 2014-02-06 ENCOUNTER — Ambulatory Visit: Payer: Self-pay | Admitting: Internal Medicine

## 2014-02-06 ENCOUNTER — Other Ambulatory Visit: Payer: Self-pay | Admitting: Physician Assistant

## 2014-02-15 ENCOUNTER — Other Ambulatory Visit: Payer: Self-pay | Admitting: Urology

## 2014-02-15 DIAGNOSIS — M949 Disorder of cartilage, unspecified: Principal | ICD-10-CM

## 2014-02-15 DIAGNOSIS — M899 Disorder of bone, unspecified: Secondary | ICD-10-CM

## 2014-02-27 ENCOUNTER — Ambulatory Visit
Admission: RE | Admit: 2014-02-27 | Discharge: 2014-02-27 | Disposition: A | Discharge status: Home / Self Care | Payer: BC Managed Care – PPO | Source: Ambulatory Visit | Attending: Urology | Admitting: Urology

## 2014-02-27 DIAGNOSIS — M899 Disorder of bone, unspecified: Secondary | ICD-10-CM

## 2014-02-27 DIAGNOSIS — M949 Disorder of cartilage, unspecified: Principal | ICD-10-CM

## 2014-02-28 ENCOUNTER — Encounter: Payer: Self-pay | Admitting: Physician Assistant

## 2014-02-28 ENCOUNTER — Ambulatory Visit (INDEPENDENT_AMBULATORY_CARE_PROVIDER_SITE_OTHER): Payer: BC Managed Care – PPO | Admitting: Physician Assistant

## 2014-02-28 VITALS — BP 132/82 | HR 88 | Temp 98.2°F | Resp 16 | Ht 72.5 in | Wt 360.0 lb

## 2014-02-28 DIAGNOSIS — J208 Acute bronchitis due to other specified organisms: Secondary | ICD-10-CM

## 2014-02-28 MED ORDER — PROMETHAZINE-CODEINE 6.25-10 MG/5ML PO SYRP: 5.0000 mL | ORAL_SOLUTION | Freq: Four times a day (QID) | ORAL | Status: DC | PRN

## 2014-02-28 MED ORDER — LEVOFLOXACIN 500 MG PO TABS: 500.0000 mg | ORAL_TABLET | Freq: Every day | ORAL | Status: AC

## 2014-02-28 MED ORDER — PREDNISONE 20 MG PO TABS: ORAL_TABLET | ORAL | Status: DC

## 2014-02-28 NOTE — Patient Instructions (Signed)

## 2014-02-28 NOTE — Progress Notes (Signed)
   Subjective:    Patient ID: Mark Harrell, male    DOB: 15-Jan-1961, 53 y.o.   MRN: [id number]  Cough This is a recurrent problem. Episode onset: 1 month. The problem has been unchanged. The cough is productive of purulent sputum (yellow productive cough). Associated symptoms include chest pain (with cough/tightness), myalgias, nasal congestion, postnasal drip, rhinorrhea, shortness of breath and wheezing. Pertinent negatives include no chills, ear congestion, ear pain, fever, headaches, heartburn, hemoptysis, rash, sore throat, sweats or weight loss. Treatments tried: zpak and a refill, cough syrup, nasal spray, allergy pill, singulair. The treatment provided mild relief.      Review of Systems  Constitutional: Negative for fever, chills and weight loss.  HENT: Positive for congestion, postnasal drip and rhinorrhea. Negative for ear pain, sore throat, trouble swallowing and voice change.   Respiratory: Positive for cough, shortness of breath and wheezing. Negative for hemoptysis.   Cardiovascular: Positive for chest pain (with cough/tightness).  Gastrointestinal: Negative for heartburn.  Musculoskeletal: Positive for myalgias.  Skin: Negative for rash.  Neurological: Negative for headaches.       Objective:   Physical Exam  Constitutional: He is oriented to person, place, and time. He appears well-developed and well-nourished.  HENT:  Head: Normocephalic and atraumatic.  Right Ear: External ear normal.  Left Ear: External ear normal.  Eyes: Conjunctivae are normal. Pupils are equal, round, and reactive to light.  Neck: Normal range of motion. Neck supple.  Cardiovascular: Normal rate, regular rhythm and normal heart sounds.   Pulmonary/Chest: Effort normal and breath sounds normal.  Abdominal: Soft. Bowel sounds are normal.  Musculoskeletal: Normal range of motion.  Lymphadenopathy:    He has cervical adenopathy.  Neurological: He is alert and oriented to person, place, and time.       Assessment & Plan:  1. Acute bronchitis due to other specified organisms [J20.8] Can do albuterol at home too, hold levaquin and try symptomatic treatment first. Explained that cough is last thing to resolve due to inflammation with URI.  - predniSONE (DELTASONE) 20 MG tablet; Take one pill two times daily for 3 days, take one pill daily for 4 days.  Dispense: 10 tablet; Refill: 0 - levofloxacin (LEVAQUIN) 500 MG tablet; Take 1 tablet (500 mg total) by mouth daily.  Dispense: 10 tablet; Refill: 0 - promethazine-codeine (PHENERGAN WITH CODEINE) 6.25-10 MG/5ML syrup; Take 5 mLs by mouth every 6 (six) hours as needed for cough.  Dispense: 240 mL; Refill: 0

## 2014-03-16 ENCOUNTER — Other Ambulatory Visit: Payer: BC Managed Care – PPO

## 2014-03-16 DIAGNOSIS — E291 Testicular hypofunction: Secondary | ICD-10-CM

## 2014-03-20 ENCOUNTER — Ambulatory Visit (INDEPENDENT_AMBULATORY_CARE_PROVIDER_SITE_OTHER): Payer: BC Managed Care – PPO | Admitting: Endocrinology

## 2014-03-20 ENCOUNTER — Other Ambulatory Visit: Payer: Self-pay | Admitting: *Deleted

## 2014-03-20 ENCOUNTER — Encounter: Payer: Self-pay | Admitting: Endocrinology

## 2014-03-20 VITALS — BP 116/72 | HR 80 | Temp 98.1°F | Resp 16 | Ht 72.0 in | Wt 359.2 lb

## 2014-03-20 DIAGNOSIS — E291 Testicular hypofunction: Secondary | ICD-10-CM

## 2014-03-20 LAB — TESTOSTERONE, FREE, TOTAL, SHBG
Testosterone, Free: 6.3 pg/mL — ABNORMAL LOW (ref 7.2–24.0)
Testosterone, total: 199.3 ng/dL — ABNORMAL LOW (ref 348.0–1197.0)

## 2014-03-20 MED ORDER — TESTOSTERONE 20.25 MG/ACT (1.62%) TD GEL: TRANSDERMAL | Status: DC

## 2014-03-20 NOTE — Patient Instructions (Signed)
Check cost of Testopel
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
## 2014-03-21 ENCOUNTER — Ambulatory Visit (INDEPENDENT_AMBULATORY_CARE_PROVIDER_SITE_OTHER): Payer: BC Managed Care – PPO | Admitting: Physician Assistant

## 2014-03-21 ENCOUNTER — Ambulatory Visit
Admission: RE | Admit: 2014-03-21 | Discharge: 2014-03-21 | Disposition: A | Discharge status: Home / Self Care | Payer: BC Managed Care – PPO | Source: Ambulatory Visit | Attending: Physician Assistant | Admitting: Physician Assistant

## 2014-03-21 ENCOUNTER — Encounter: Payer: Self-pay | Admitting: Physician Assistant

## 2014-03-21 VITALS — BP 120/78 | HR 84 | Temp 98.1°F | Resp 16 | Ht 72.5 in | Wt 360.0 lb

## 2014-03-21 DIAGNOSIS — J208 Acute bronchitis due to other specified organisms: Secondary | ICD-10-CM

## 2014-03-21 MED ORDER — DOXYCYCLINE HYCLATE 100 MG PO TABS: 100.0000 mg | ORAL_TABLET | Freq: Two times a day (BID) | ORAL | Status: DC

## 2014-03-21 MED ORDER — PROMETHAZINE-CODEINE 6.25-10 MG/5ML PO SYRP: 5.0000 mL | ORAL_SOLUTION | Freq: Four times a day (QID) | ORAL | Status: DC | PRN

## 2014-03-21 MED ORDER — CEFTRIAXONE SODIUM 1 G IJ SOLR
250.0000 mg | Freq: Once | INTRAMUSCULAR | Status: AC
  Administered 2014-03-21: 250 mg via INTRAMUSCULAR
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
## 2014-03-21 NOTE — Patient Instructions (Signed)
- Take the Doxycycline as prescribed. - drink plenty of fluids and rest. - Go get Chest X-ray.  Will call with results. - Please call office if not better in 10-14 days. - Take Tylenol for Pain as needed. Acute Bronchitis Bronchitis is inflammation of the airways that extend from the windpipe into the lungs (bronchi). The inflammation often causes mucus to develop. This leads to a cough, which is the most common symptom of bronchitis.  In acute bronchitis, the condition usually develops suddenly and goes away over time, usually in a couple weeks. Smoking, allergies, and asthma can make bronchitis worse. Repeated episodes of bronchitis may cause further lung problems.  CAUSES Acute bronchitis is most often caused by the same virus that causes a cold. The virus can spread from person to person (contagious) through coughing, sneezing, and touching contaminated objects. SIGNS AND SYMPTOMS   Cough.   Fever.   Coughing up mucus.   Body aches.   Chest congestion.   Chills.   Shortness of breath.   Sore throat.  DIAGNOSIS  Acute bronchitis is usually diagnosed through a physical exam. Your health care provider will also ask you questions about your medical history. Tests, such as chest X-rays, are sometimes done to rule out other conditions.  TREATMENT  Acute bronchitis usually goes away in a couple weeks. Oftentimes, no medical treatment is necessary. Medicines are sometimes given for relief of fever or cough. Antibiotic medicines are usually not needed but may be prescribed in certain situations. In some cases, an inhaler may be recommended to help reduce shortness of breath and control the cough. A cool mist vaporizer may also be used to help thin bronchial secretions and make it easier to clear the chest.  HOME CARE INSTRUCTIONS  Get plenty of rest.   Drink enough fluids to keep your urine clear or pale yellow (unless you have a medical condition that requires fluid  restriction). Increasing fluids may help thin your respiratory secretions (sputum) and reduce chest congestion, and it will prevent dehydration.   Take medicines only as directed by your health care provider.  If you were prescribed an antibiotic medicine, finish it all even if you start to feel better.  Avoid smoking and secondhand smoke. Exposure to cigarette smoke or irritating chemicals will make bronchitis worse. If you are a smoker, consider using nicotine gum or skin patches to help control withdrawal symptoms. Quitting smoking will help your lungs heal faster.   Reduce the chances of another bout of acute bronchitis by washing your hands frequently, avoiding people with cold symptoms, and trying not to touch your hands to your mouth, nose, or eyes.   Keep all follow-up visits as directed by your health care provider.  SEEK MEDICAL CARE IF: Your symptoms do not improve after 1 week of treatment.  SEEK IMMEDIATE MEDICAL CARE IF:  You develop an increased fever or chills.   You have chest pain.   You have severe shortness of breath.  You have bloody sputum.   You develop dehydration.  You faint or repeatedly feel like you are going to pass out.  You develop repeated vomiting.  You develop a severe headache. MAKE SURE YOU:   Understand these instructions.  Will watch your condition.  Will get help right away if you are not doing well or get worse. Document Released: 06/19/2004 Document Revised: 09/26/2013 Document Reviewed: 11/02/2012 [facility] Patient Information 2015 [place], [place]. This information is not intended to replace advice given to you by your  health care provider. Make sure you discuss any questions you have with your health care provider.

## 2014-03-22 ENCOUNTER — Other Ambulatory Visit: Payer: Self-pay | Admitting: Physician Assistant

## 2014-03-22 DIAGNOSIS — K219 Gastro-esophageal reflux disease without esophagitis: Secondary | ICD-10-CM

## 2014-03-22 MED ORDER — ESOMEPRAZOLE MAGNESIUM 20 MG PO CPDR: 20.0000 mg | DELAYED_RELEASE_CAPSULE | Freq: Every day | ORAL | Status: DC

## 2014-03-30 ENCOUNTER — Encounter: Payer: Self-pay | Admitting: Physician Assistant

## 2014-03-30 ENCOUNTER — Ambulatory Visit (INDEPENDENT_AMBULATORY_CARE_PROVIDER_SITE_OTHER): Payer: BC Managed Care – PPO | Admitting: Physician Assistant

## 2014-03-30 VITALS — BP 118/70 | HR 82 | Temp 98.4°F | Resp 18 | Ht 72.5 in | Wt 359.0 lb

## 2014-03-30 DIAGNOSIS — J209 Acute bronchitis, unspecified: Secondary | ICD-10-CM

## 2014-03-30 MED ORDER — FLUTICASONE FUROATE-VILANTEROL 100-25 MCG/INH IN AEPB: 1.0000 | INHALATION_SPRAY | Freq: Every day | RESPIRATORY_TRACT | Status: DC

## 2014-03-30 MED ORDER — BENZONATATE 100 MG PO CAPS: 100.0000 mg | ORAL_CAPSULE | Freq: Four times a day (QID) | ORAL | Status: DC | PRN

## 2014-03-30 MED ORDER — IPRATROPIUM-ALBUTEROL 0.5-2.5 (3) MG/3ML IN SOLN
3.0000 mL | Freq: Once | RESPIRATORY_TRACT | Status: AC
  Administered 2014-03-30: 3 mL via RESPIRATORY_TRACT

## 2014-03-30 NOTE — Patient Instructions (Addendum)
-Take the [facility] as prescribed. - 1 puff daily.  Make sure you rinse your mouth out after each use to prevent yeast infections. -Take the Tessalon perles as prescribed for cough. -Continue Proair as prescribed. -Continue Singulair as prescribed. -Make sure you are drinking plenty of fluids to stay hydrated and to thin out the mucous.  If you are not feeling better in 10-14 days, then please call the office. -Please keep your scheduled appointment on 05/09/14 with Dr. Melford Aase.  Acute Bronchitis Bronchitis is inflammation of the airways that extend from the windpipe into the lungs (bronchi). The inflammation often causes mucus to develop. This leads to a cough, which is the most common symptom of bronchitis.  In acute bronchitis, the condition usually develops suddenly and goes away over time, usually in a couple weeks. Smoking, allergies, and asthma can make bronchitis worse. Repeated episodes of bronchitis may cause further lung problems.  CAUSES Acute bronchitis is most often caused by the same virus that causes a cold. The virus can spread from person to person (contagious) through coughing, sneezing, and touching contaminated objects. SIGNS AND SYMPTOMS   Cough.   Fever.   Coughing up mucus.   Body aches.   Chest congestion.   Chills.   Shortness of breath.   Sore throat.  DIAGNOSIS  Acute bronchitis is usually diagnosed through a physical exam. Your health care provider will also ask you questions about your medical history. Tests, such as chest X-rays, are sometimes done to rule out other conditions.  TREATMENT  Acute bronchitis usually goes away in a couple weeks. Oftentimes, no medical treatment is necessary. Medicines are sometimes given for relief of fever or cough. Antibiotic medicines are usually not needed but may be prescribed in certain situations. In some cases, an inhaler may be recommended to help reduce shortness of breath and control the cough. A  cool mist vaporizer may also be used to help thin bronchial secretions and make it easier to clear the chest.  HOME CARE INSTRUCTIONS  Get plenty of rest.   Drink enough fluids to keep your urine clear or pale yellow (unless you have a medical condition that requires fluid restriction). Increasing fluids may help thin your respiratory secretions (sputum) and reduce chest congestion, and it will prevent dehydration.   Take medicines only as directed by your health care provider.  If you were prescribed an antibiotic medicine, finish it all even if you start to feel better.  Avoid smoking and secondhand smoke. Exposure to cigarette smoke or irritating chemicals will make bronchitis worse. If you are a smoker, consider using nicotine gum or skin patches to help control withdrawal symptoms. Quitting smoking will help your lungs heal faster.   Reduce the chances of another bout of acute bronchitis by washing your hands frequently, avoiding people with cold symptoms, and trying not to touch your hands to your mouth, nose, or eyes.   Keep all follow-up visits as directed by your health care provider.  SEEK MEDICAL CARE IF: Your symptoms do not improve after 1 week of treatment.  SEEK IMMEDIATE MEDICAL CARE IF:  You develop an increased fever or chills.   You have chest pain.   You have severe shortness of breath.  You have bloody sputum.   You develop dehydration.  You faint or repeatedly feel like you are going to pass out.  You develop repeated vomiting.  You develop a severe headache. MAKE SURE YOU:   Understand these instructions.  Will watch your  condition.  Will get help right away if you are not doing well or get worse. Document Released: 06/19/2004 Document Revised: 09/26/2013 Document Reviewed: 11/02/2012 [facility] Patient Information 2015 [place], [place]. This information is not intended to replace advice given to you by your health care provider. Make sure you  discuss any questions you have with your health care provider.
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
## 2014-04-06 ENCOUNTER — Other Ambulatory Visit (HOSPITAL_COMMUNITY): Payer: Self-pay | Admitting: Unknown Physician Specialty

## 2014-04-06 DIAGNOSIS — M5416 Radiculopathy, lumbar region: Secondary | ICD-10-CM

## 2014-04-15 ENCOUNTER — Other Ambulatory Visit: Payer: Self-pay | Admitting: Physician Assistant

## 2014-04-26 ENCOUNTER — Ambulatory Visit (HOSPITAL_COMMUNITY)
Admission: RE | Admit: 2014-04-26 | Discharge: 2014-04-26 | Disposition: A | Discharge status: Home / Self Care | Payer: BC Managed Care – PPO | Source: Ambulatory Visit | Attending: Unknown Physician Specialty | Admitting: Unknown Physician Specialty

## 2014-04-26 DIAGNOSIS — M79602 Pain in left arm: Secondary | ICD-10-CM | POA: Diagnosis not present

## 2014-04-26 DIAGNOSIS — R531 Weakness: Secondary | ICD-10-CM | POA: Insufficient documentation

## 2014-04-26 DIAGNOSIS — M79605 Pain in left leg: Secondary | ICD-10-CM | POA: Diagnosis not present

## 2014-04-26 DIAGNOSIS — M5416 Radiculopathy, lumbar region: Secondary | ICD-10-CM

## 2014-04-26 DIAGNOSIS — M1288 Other specific arthropathies, not elsewhere classified, other specified site: Secondary | ICD-10-CM | POA: Diagnosis not present

## 2014-04-26 DIAGNOSIS — M5127 Other intervertebral disc displacement, lumbosacral region: Secondary | ICD-10-CM | POA: Insufficient documentation

## 2014-04-26 DIAGNOSIS — M549 Dorsalgia, unspecified: Secondary | ICD-10-CM | POA: Diagnosis present

## 2014-05-09 ENCOUNTER — Ambulatory Visit (INDEPENDENT_AMBULATORY_CARE_PROVIDER_SITE_OTHER): Payer: BC Managed Care – PPO | Admitting: Physician Assistant

## 2014-05-09 ENCOUNTER — Encounter: Payer: Self-pay | Admitting: Internal Medicine

## 2014-05-09 VITALS — BP 134/98 | HR 72 | Temp 98.1°F | Resp 16 | Ht 72.5 in | Wt 365.8 lb

## 2014-05-09 DIAGNOSIS — R7309 Other abnormal glucose: Secondary | ICD-10-CM

## 2014-05-09 DIAGNOSIS — Z79899 Other long term (current) drug therapy: Secondary | ICD-10-CM

## 2014-05-09 DIAGNOSIS — F32A Depression, unspecified: Secondary | ICD-10-CM

## 2014-05-09 DIAGNOSIS — N400 Enlarged prostate without lower urinary tract symptoms: Secondary | ICD-10-CM

## 2014-05-09 DIAGNOSIS — E785 Hyperlipidemia, unspecified: Secondary | ICD-10-CM

## 2014-05-09 DIAGNOSIS — K2100 Gastro-esophageal reflux disease with esophagitis, without bleeding: Secondary | ICD-10-CM

## 2014-05-09 DIAGNOSIS — E291 Testicular hypofunction: Secondary | ICD-10-CM

## 2014-05-09 DIAGNOSIS — I10 Essential (primary) hypertension: Secondary | ICD-10-CM

## 2014-05-09 DIAGNOSIS — J449 Chronic obstructive pulmonary disease, unspecified: Secondary | ICD-10-CM

## 2014-05-09 DIAGNOSIS — K219 Gastro-esophageal reflux disease without esophagitis: Secondary | ICD-10-CM | POA: Insufficient documentation

## 2014-05-09 DIAGNOSIS — E559 Vitamin D deficiency, unspecified: Secondary | ICD-10-CM

## 2014-05-09 DIAGNOSIS — F329 Major depressive disorder, single episode, unspecified: Secondary | ICD-10-CM

## 2014-05-09 DIAGNOSIS — R7303 Prediabetes: Secondary | ICD-10-CM

## 2014-05-09 DIAGNOSIS — J209 Acute bronchitis, unspecified: Secondary | ICD-10-CM

## 2014-05-09 MED ORDER — ESOMEPRAZOLE MAGNESIUM 20 MG PO CPDR: 20.0000 mg | DELAYED_RELEASE_CAPSULE | Freq: Every day | ORAL | Status: DC

## 2014-05-09 NOTE — Patient Instructions (Addendum)
Monitor BP at home, call if stays above 140/90.   Cough: Possible nocturnal GERD- start back on nexium Cough/ashtma/from inflammation- denied prednisone, will give breo Work hard to stop throat clearing.  Plan a time when you will able to practice complete voice rest  Use sugar free candy to ease cough and throat irritation.      Bad carbs also include fruit juice, alcohol, and sweet tea. These are empty calories that do not signal to your brain that you are full.   Please remember the good carbs are still carbs which convert into sugar. So please measure them out no more than 1/2-1 cup of rice, oatmeal, pasta, and beans  Veggies are however free foods! Pile them on.   Not all fruit is created equal. Please see the list below, the fruit at the bottom is higher in sugars than the fruit at the top. Please avoid all dried fruits.    We want weight loss that will last so you should lose 1-2 pounds a week.  THAT IS IT! Please pick THREE things a month to change. Once it is a habit check off the item. Then pick another three items off the list to become habits.  If you are already doing a habit on the list GREAT!  Cross that item off! o Don't drink your calories. Ie, alcohol, soda, fruit juice, and sweet tea.  o Drink more water. Drink a glass when you feel hungry or before each meal.  o Eat breakfast - Complex carb and protein (likeDannon light and fit yogurt, oatmeal, fruit, eggs, [place] bacon). o Measure your cereal.  Eat no more than one cup a day. (ie [place]) o Eat an apple a day. o Add a vegetable a day. o Try a new vegetable a month. o Use Pam! Stop using oil or butter to cook. o Don't finish your plate or use smaller plates. o Share your dessert. o Eat sugar free Jello for dessert or frozen grapes. o Don't eat 2-3 hours before bed. o Switch to whole wheat bread, pasta, and brown rice. o Make healthier choices when you eat out. No fries! o Pick baked chicken, NOT fried. o Don't  forget to SLOW DOWN when you eat. It is not going anywhere.  o Take the stairs. o Park far away in the parking lot o [employer] (or weights) for 10 minutes while watching TV. o Walk at work for 10 minutes during break. o Walk outside 1 time a week with your friend, kids, dog, or significant other. o Start a walking group at [place] the mall as much as you can tolerate.  o Keep a food diary. o Weigh yourself daily. o Walk for 15 minutes 3 days per week. o Cook at home more often and eat out less.  If life happens and you go back to old habits, it is okay.  Just start over. You can do it!   If you experience chest pain, get short of breath, or tired during the exercise, please stop immediately and inform your doctor.
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
## 2014-05-10 LAB — BASIC METABOLIC PANEL WITH GFR
BUN: 14 mg/dL (ref 6–23)
CALCIUM: 8.7 mg/dL (ref 8.4–10.5)
CHLORIDE: 101 meq/L (ref 96–112)
CO2: 30 mEq/L (ref 19–32)
Creat: 0.88 mg/dL (ref 0.50–1.35)
GFR, Est African American: >89 mL/min
GFR, Est Non African American: >89 mL/min
Glucose, Bld: 104 mg/dL — ABNORMAL HIGH (ref 70–99)
Potassium: 4.2 mEq/L (ref 3.5–5.3)
SODIUM: 140 meq/L (ref 135–145)

## 2014-05-10 LAB — CBC WITH DIFFERENTIAL/PLATELET
BASOS ABS: 0.1 10*3/uL (ref 0.0–0.1)
Basophils Relative: 1 % (ref 0–1)
EOS ABS: 0.2 10*3/uL (ref 0.0–0.7)
Eosinophils Relative: 2 % (ref 0–5)
HCT: 44.4 % (ref 39.0–52.0)
HEMOGLOBIN: 15 g/dL (ref 13.0–17.0)
LYMPHS ABS: 3.6 10*3/uL (ref 0.7–4.0)
LYMPHS PCT: 31 % (ref 12–46)
MCH: 30 pg (ref 26.0–34.0)
MCHC: 33.8 g/dL (ref 30.0–36.0)
MCV: 88.8 fL (ref 78.0–100.0)
MPV: 9.9 fL (ref 9.4–12.4)
Monocytes Absolute: 1 10*3/uL (ref 0.1–1.0)
Monocytes Relative: 9 % (ref 3–12)
NEUTROS PCT: 57 % (ref 43–77)
Neutro Abs: 6.6 10*3/uL (ref 1.7–7.7)
Platelets: 307 10*3/uL (ref 150–400)
RBC: 5 MIL/uL (ref 4.22–5.81)
RDW: 14 % (ref 11.5–15.5)
WBC: 11.5 10*3/uL — AB (ref 4.0–10.5)

## 2014-05-10 LAB — HEPATIC FUNCTION PANEL
ALT: 43 U/L (ref 0–53)
AST: 28 U/L (ref 0–37)
Albumin: 3.8 g/dL (ref 3.5–5.2)
Alkaline Phosphatase: 58 U/L (ref 39–117)
BILIRUBIN DIRECT: 0.2 mg/dL (ref 0.0–0.3)
BILIRUBIN TOTAL: 1.2 mg/dL (ref 0.2–1.2)
Indirect Bilirubin: 1 mg/dL (ref 0.2–1.2)
Total Protein: 6.1 g/dL (ref 6.0–8.3)

## 2014-05-10 LAB — LIPID PANEL
CHOL/HDL RATIO: 4.7 ratio
CHOLESTEROL: 135 mg/dL (ref 0–200)
HDL: 29 mg/dL — AB (ref >39)
LDL Cholesterol: 35 mg/dL (ref 0–99)
Triglycerides: 353 mg/dL — ABNORMAL HIGH (ref <150)
VLDL: 71 mg/dL — AB (ref 0–40)

## 2014-05-10 LAB — INSULIN, FASTING: Insulin fasting, serum: 81.4 u[IU]/mL — ABNORMAL HIGH (ref 2.0–19.6)

## 2014-05-10 LAB — VITAMIN D 25 HYDROXY (VIT D DEFICIENCY, FRACTURES): VIT D 25 HYDROXY: 39 ng/mL (ref 30–100)

## 2014-05-10 LAB — MAGNESIUM: MAGNESIUM: 1.6 mg/dL (ref 1.5–2.5)

## 2014-05-10 LAB — TSH: TSH: 1.18 u[IU]/mL (ref 0.350–4.500)

## 2014-05-10 LAB — HEMOGLOBIN A1C
Hgb A1c MFr Bld: 5.8 % — ABNORMAL HIGH (ref <5.7)
Mean Plasma Glucose: 120 mg/dL — ABNORMAL HIGH (ref <117)

## 2014-05-14 ENCOUNTER — Other Ambulatory Visit: Payer: Self-pay | Admitting: Physician Assistant

## 2014-05-15 ENCOUNTER — Other Ambulatory Visit: Payer: Self-pay | Admitting: Emergency Medicine

## 2014-06-05 ENCOUNTER — Ambulatory Visit: Payer: BLUE CROSS/BLUE SHIELD | Attending: Unknown Physician Specialty | Admitting: Physical Therapy

## 2014-06-05 ENCOUNTER — Encounter: Payer: Self-pay | Admitting: Physical Therapy

## 2014-06-05 DIAGNOSIS — R269 Unspecified abnormalities of gait and mobility: Secondary | ICD-10-CM | POA: Diagnosis not present

## 2014-06-05 DIAGNOSIS — R52 Pain, unspecified: Secondary | ICD-10-CM | POA: Diagnosis not present

## 2014-06-05 DIAGNOSIS — R6889 Other general symptoms and signs: Secondary | ICD-10-CM | POA: Diagnosis not present
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
## 2014-06-09 ENCOUNTER — Encounter: Payer: Self-pay | Admitting: Physical Therapy

## 2014-06-09 ENCOUNTER — Ambulatory Visit: Payer: BLUE CROSS/BLUE SHIELD | Admitting: Physical Therapy

## 2014-06-09 DIAGNOSIS — R269 Unspecified abnormalities of gait and mobility: Secondary | ICD-10-CM

## 2014-06-09 DIAGNOSIS — R52 Pain, unspecified: Secondary | ICD-10-CM

## 2014-06-09 DIAGNOSIS — R6889 Other general symptoms and signs: Secondary | ICD-10-CM
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
## 2014-06-15 ENCOUNTER — Other Ambulatory Visit: Payer: BC Managed Care – PPO

## 2014-06-16 ENCOUNTER — Ambulatory Visit: Payer: BLUE CROSS/BLUE SHIELD | Admitting: Physical Therapy

## 2014-06-19 ENCOUNTER — Ambulatory Visit: Payer: BC Managed Care – PPO | Admitting: Endocrinology

## 2014-06-20 ENCOUNTER — Encounter: Payer: Self-pay | Admitting: Physical Therapy

## 2014-06-20 ENCOUNTER — Ambulatory Visit: Payer: BLUE CROSS/BLUE SHIELD | Admitting: Physical Therapy

## 2014-06-20 DIAGNOSIS — R6889 Other general symptoms and signs: Secondary | ICD-10-CM

## 2014-06-20 DIAGNOSIS — R52 Pain, unspecified: Secondary | ICD-10-CM | POA: Diagnosis not present

## 2014-06-20 DIAGNOSIS — R269 Unspecified abnormalities of gait and mobility: Secondary | ICD-10-CM
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
## 2014-06-23 ENCOUNTER — Ambulatory Visit: Payer: BLUE CROSS/BLUE SHIELD | Admitting: Physical Therapy

## 2014-06-27 ENCOUNTER — Encounter: Payer: Self-pay | Admitting: Physical Therapy

## 2014-06-27 ENCOUNTER — Ambulatory Visit: Payer: BLUE CROSS/BLUE SHIELD | Attending: Unknown Physician Specialty | Admitting: Physical Therapy

## 2014-06-27 DIAGNOSIS — R52 Pain, unspecified: Secondary | ICD-10-CM | POA: Insufficient documentation

## 2014-06-27 DIAGNOSIS — R6889 Other general symptoms and signs: Secondary | ICD-10-CM | POA: Diagnosis not present

## 2014-06-27 DIAGNOSIS — R269 Unspecified abnormalities of gait and mobility: Secondary | ICD-10-CM
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
## 2014-06-27 NOTE — Patient Instructions (Signed)
Seated Bouncing   Sit on ball. Bounce up and down. Keep tall posture. 1 minutes x 3 reps.   Copyright  VHI. All rights reserved.  Unsupported Pelvic Tilt   Gently rock pelvis forward and backward. Do 1___ sets of _10__ repetitions.  Copyright  VHI. All rights reserved.  Unsupported Lateral Pelvic Tilt   Gently move hips from side to side. Do _1__ sets of _10_ repetitions.  Copyright  VHI. All rights reserved.  Unsupported Pelvic Circle   Gently rotate pelvis clockwise _10__ times, then counterclockwise _10_ times.   Copyright  VHI. All rights reserved.  Sitting Knee Extension   Sit on ball. Straighten one knee while keeping balance. Do __1_ sets of __10_ repetitions each leg.  Copyright  VHI. All rights reserved.

## 2014-06-29 ENCOUNTER — Ambulatory Visit: Payer: BLUE CROSS/BLUE SHIELD | Admitting: Physical Therapy

## 2014-07-03 ENCOUNTER — Encounter: Payer: Self-pay | Admitting: Physical Therapy

## 2014-07-03 ENCOUNTER — Ambulatory Visit: Payer: BLUE CROSS/BLUE SHIELD | Admitting: Physical Therapy

## 2014-07-03 DIAGNOSIS — R269 Unspecified abnormalities of gait and mobility: Secondary | ICD-10-CM

## 2014-07-03 DIAGNOSIS — R52 Pain, unspecified: Secondary | ICD-10-CM

## 2014-07-03 DIAGNOSIS — R6889 Other general symptoms and signs: Secondary | ICD-10-CM

## 2014-07-03 NOTE — Patient Instructions (Signed)
(  Home) Flexion: Pelvic Tilt   Lie with neck supported, knees bent, feet flat. Tighten and suck stomach in, pushing back down against surface. Do not push down with legs. Repeat ____ times per set. Do ____ sets per session. Do ____ sessions per week.  Copyright  VHI. All rights reserved.  Supine   Lie on back, hands clasped behind one knee. Pull knee in toward same-side armpit until a comfortable stretch is felt in lower back and buttocks. Hold ___ seconds.  Repeat ___ times per session. Do ___ sessions per day.  Copyright  VHI. All rights reserved.  Double Knee to Chest (Flexion)   Gently pull both knees toward chest. Feel stretch in lower back or buttock area. Breathing deeply, Hold ____ seconds. Repeat ____ times. Do ____ sessions per day.  http://gt2.exer.us/227   Copyright  VHI. All rights reserved.  Hamstring: Towel Stretch (Supine)   Lie on back. Loop towel around left foot, hip and knee at 90. Straighten knee and pull foot toward body. Hold ___ seconds. Relax. Repeat ___ times. Do ___ times a day. Repeat with other leg.    Copyright  VHI. All rights reserved.  Knee Roll   Lying on back, with knees bent and feet flat on floor, arms outstretched to sides, slowly roll both knees to side, hold 5 seconds. Back to starting position, hold 5 seconds. Then to opposite side, hold 5 seconds. Return to starting position. Keep shoulders and arms in contact with floor.   Copyright  VHI. All rights reserved.  Bridging   Slowly raise buttocks from floor, keeping stomach tight. Repeat ____ times per set. Do ____ sets per session. Do ____ sessions per day.  http://orth.exer.us/1096   Copyright  VHI. All rights reserved.
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
## 2014-07-05 ENCOUNTER — Other Ambulatory Visit: Payer: Self-pay | Admitting: Emergency Medicine

## 2014-07-06 ENCOUNTER — Ambulatory Visit: Payer: BLUE CROSS/BLUE SHIELD | Admitting: Physical Therapy

## 2014-07-11 ENCOUNTER — Ambulatory Visit: Payer: BLUE CROSS/BLUE SHIELD | Admitting: Physical Therapy

## 2014-07-11 ENCOUNTER — Encounter: Payer: Self-pay | Admitting: Physical Therapy

## 2014-07-11 DIAGNOSIS — R52 Pain, unspecified: Secondary | ICD-10-CM | POA: Diagnosis not present

## 2014-07-11 DIAGNOSIS — R269 Unspecified abnormalities of gait and mobility: Secondary | ICD-10-CM

## 2014-07-11 DIAGNOSIS — R6889 Other general symptoms and signs: Secondary | ICD-10-CM
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
## 2014-07-17 ENCOUNTER — Ambulatory Visit: Payer: BLUE CROSS/BLUE SHIELD | Admitting: Physical Therapy

## 2014-07-17 ENCOUNTER — Encounter: Payer: Self-pay | Admitting: Physical Therapy

## 2014-07-17 DIAGNOSIS — R52 Pain, unspecified: Secondary | ICD-10-CM | POA: Diagnosis not present

## 2014-07-17 DIAGNOSIS — R269 Unspecified abnormalities of gait and mobility: Secondary | ICD-10-CM

## 2014-07-17 DIAGNOSIS — R6889 Other general symptoms and signs: Secondary | ICD-10-CM
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
## 2014-07-24 ENCOUNTER — Ambulatory Visit: Payer: BLUE CROSS/BLUE SHIELD | Admitting: Physical Therapy

## 2014-07-24 ENCOUNTER — Encounter: Payer: Self-pay | Admitting: Physical Therapy

## 2014-07-24 DIAGNOSIS — R6889 Other general symptoms and signs: Secondary | ICD-10-CM

## 2014-07-24 DIAGNOSIS — R52 Pain, unspecified: Secondary | ICD-10-CM | POA: Diagnosis not present

## 2014-07-24 DIAGNOSIS — R269 Unspecified abnormalities of gait and mobility: Secondary | ICD-10-CM
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
## 2014-07-24 NOTE — Patient Instructions (Signed)
Fitness Plan: 1. Strength - legs, arms and trunk exercises 2. Flexibility - legs (hamstrings, heelcords, etc.) trunk (rotation, forward lean, back lean, side bends), arms 3.Balance 4.Endurance- focus on increasing time over intensity or speed. Seated machines are better as will last longer.  You can exercise 5-10 here and 10-15 minutes there.  Better to break up activities and exercise throughout your day. Use "down time" : commercials if watching TV, balance while waiting on toast or water to boil in kitchen, etc.

## 2014-07-25 ENCOUNTER — Encounter: Payer: Self-pay | Admitting: Internal Medicine

## 2014-07-31 ENCOUNTER — Ambulatory Visit: Payer: BLUE CROSS/BLUE SHIELD | Attending: Unknown Physician Specialty | Admitting: Physical Therapy

## 2014-07-31 DIAGNOSIS — R6889 Other general symptoms and signs: Secondary | ICD-10-CM | POA: Insufficient documentation

## 2014-07-31 DIAGNOSIS — R52 Pain, unspecified: Secondary | ICD-10-CM | POA: Insufficient documentation

## 2014-07-31 DIAGNOSIS — R269 Unspecified abnormalities of gait and mobility: Secondary | ICD-10-CM | POA: Insufficient documentation

## 2014-08-02 ENCOUNTER — Encounter: Payer: Self-pay | Admitting: Internal Medicine

## 2014-08-02 ENCOUNTER — Ambulatory Visit (INDEPENDENT_AMBULATORY_CARE_PROVIDER_SITE_OTHER): Payer: BLUE CROSS/BLUE SHIELD | Admitting: Internal Medicine

## 2014-08-02 VITALS — BP 122/82 | HR 88 | Temp 97.5°F | Resp 16 | Ht 72.25 in | Wt 366.2 lb

## 2014-08-02 DIAGNOSIS — K219 Gastro-esophageal reflux disease without esophagitis: Secondary | ICD-10-CM

## 2014-08-02 DIAGNOSIS — Z1211 Encounter for screening for malignant neoplasm of colon: Secondary | ICD-10-CM

## 2014-08-02 DIAGNOSIS — R7309 Other abnormal glucose: Secondary | ICD-10-CM

## 2014-08-02 DIAGNOSIS — J320 Chronic maxillary sinusitis: Secondary | ICD-10-CM

## 2014-08-02 DIAGNOSIS — E559 Vitamin D deficiency, unspecified: Secondary | ICD-10-CM

## 2014-08-02 DIAGNOSIS — E782 Mixed hyperlipidemia: Secondary | ICD-10-CM

## 2014-08-02 DIAGNOSIS — Z125 Encounter for screening for malignant neoplasm of prostate: Secondary | ICD-10-CM

## 2014-08-02 DIAGNOSIS — G43809 Other migraine, not intractable, without status migrainosus: Secondary | ICD-10-CM

## 2014-08-02 DIAGNOSIS — Z79899 Other long term (current) drug therapy: Secondary | ICD-10-CM

## 2014-08-02 DIAGNOSIS — F329 Major depressive disorder, single episode, unspecified: Secondary | ICD-10-CM

## 2014-08-02 DIAGNOSIS — F32A Depression, unspecified: Secondary | ICD-10-CM

## 2014-08-02 DIAGNOSIS — E291 Testicular hypofunction: Secondary | ICD-10-CM

## 2014-08-02 DIAGNOSIS — R5383 Other fatigue: Secondary | ICD-10-CM

## 2014-08-02 DIAGNOSIS — I10 Essential (primary) hypertension: Secondary | ICD-10-CM

## 2014-08-02 DIAGNOSIS — R7303 Prediabetes: Secondary | ICD-10-CM

## 2014-08-02 DIAGNOSIS — J449 Chronic obstructive pulmonary disease, unspecified: Secondary | ICD-10-CM

## 2014-08-02 DIAGNOSIS — K589 Irritable bowel syndrome without diarrhea: Secondary | ICD-10-CM

## 2014-08-02 LAB — CBC WITH DIFFERENTIAL/PLATELET
BASOS ABS: 0.1 10*3/uL (ref 0.0–0.1)
Basophils Relative: 1 % (ref 0–1)
Eosinophils Absolute: 0.3 10*3/uL (ref 0.0–0.7)
Eosinophils Relative: 3 % (ref 0–5)
HCT: 47.5 % (ref 39.0–52.0)
Hemoglobin: 15.8 g/dL (ref 13.0–17.0)
Lymphocytes Relative: 37 % (ref 12–46)
Lymphs Abs: 3.5 10*3/uL (ref 0.7–4.0)
MCH: 29.6 pg (ref 26.0–34.0)
MCHC: 33.3 g/dL (ref 30.0–36.0)
MCV: 89.1 fL (ref 78.0–100.0)
MONOS PCT: 10 % (ref 3–12)
MPV: 10.2 fL (ref 8.6–12.4)
Monocytes Absolute: 1 10*3/uL (ref 0.1–1.0)
Neutro Abs: 4.7 10*3/uL (ref 1.7–7.7)
Neutrophils Relative %: 49 % (ref 43–77)
PLATELETS: 316 10*3/uL (ref 150–400)
RBC: 5.33 MIL/uL (ref 4.22–5.81)
RDW: 13.9 % (ref 11.5–15.5)
WBC: 9.5 10*3/uL (ref 4.0–10.5)

## 2014-08-02 MED ORDER — LEVOFLOXACIN 500 MG PO TABS
ORAL_TABLET | ORAL | Status: DC
Start: 2014-08-02 — End: 2014-08-29

## 2014-08-02 NOTE — Patient Instructions (Signed)
 Recommend the book "The END of DIETING" by Dr Joel Fuhrman   & the book "The END of DIABETES " by Dr Joel Fuhrman  At [employer] - get book & Audio CD's      Being diabetic has a  300% increased risk for heart attack, stroke, cancer, and alzheimer- type vascular dementia. It is very important that you work harder with diet by avoiding all foods that are white. Avoid white rice (brown & wild rice is OK), white potatoes (sweetpotatoes in moderation is OK), White bread or wheat bread or anything made out of white flour like bagels, donuts, rolls, buns, biscuits, cakes, pastries, cookies, pizza crust, and pasta (made from white flour & egg whites) - vegetarian pasta or spinach or wheat pasta is OK. Multigrain breads like Arnold's or [employer], or multigrain sandwich thins or flatbreads.  Diet, exercise and weight loss can reverse and cure diabetes in the early stages.  Diet, exercise and weight loss is very important in the control and prevention of complications of diabetes which affects every system in your body, ie. Brain - dementia/stroke, eyes - glaucoma/blindness, heart - heart attack/heart failure, kidneys - dialysis, stomach - gastric paralysis, intestines - malabsorption, nerves - severe painful neuritis, circulation - gangrene & loss of a leg(s), and finally cancer and Alzheimers.    I recommend avoid fried & greasy foods,  sweets/candy, white rice (brown or wild rice or Quinoa is OK), white potatoes (sweet potatoes are OK) - anything made from white flour - bagels, doughnuts, rolls, buns, biscuits,white and wheat breads, pizza crust and traditional pasta made of white flour & egg white(vegetarian pasta or spinach or wheat pasta is OK).  Multi-grain bread is OK - like multi-grain flat bread or sandwich thins. Avoid alcohol in excess. Exercise is also important.    Eat all the vegetables you want - avoid meat, especially red meat and dairy - especially cheese.  Cheese is the most  concentrated form of trans-fats which is the worst thing to clog up our arteries. Veggie cheese is OK which can be found in the fresh produce section at [employer] or [employer] or [employer]  +++++++++++++++++++++++++++++++    Preventive Care for Adults  A healthy lifestyle and preventive care can promote health and wellness. Preventive health guidelines for men include the following key practices:  A routine yearly physical is a good way to check with your health care provider about your health and preventative screening. It is a chance to share any concerns and updates on your health and to receive a thorough exam.  Visit your dentist for a routine exam and preventative care every 6 months. Brush your teeth twice a day and floss once a day. Good oral hygiene prevents tooth decay and gum disease.  The frequency of eye exams is based on your age, health, family medical history, use of contact lenses, and other factors. Follow your health care provider's recommendations for frequency of eye exams.  Eat a healthy diet. Foods such as vegetables, fruits, whole grains, low-fat dairy products, and lean protein foods contain the nutrients you need without too many calories. Decrease your intake of foods high in solid fats, added sugars, and salt. Eat the right amount of calories for you.Get information about a proper diet from your health care provider, if necessary.  Regular physical exercise is one of the most important things you can do for your health. Most adults should get at least 150 minutes of moderate-intensity exercise (any activity   that increases your heart rate and causes you to sweat) each week. In addition, most adults need muscle-strengthening exercises on 2 or more days a week.  Maintain a healthy weight. The body mass index (BMI) is a screening tool to identify possible weight problems. It provides an estimate of body fat based on height and weight. Your health care provider can  find your BMI and can help you achieve or maintain a healthy weight.For adults 20 years and older:  A BMI below 18.5 is considered underweight.  A BMI of 18.5 to 24.9 is normal.  A BMI of 25 to 29.9 is considered overweight.  A BMI of 30 and above is considered obese.  Maintain normal blood lipids and cholesterol levels by exercising and minimizing your intake of saturated fat. Eat a balanced diet with plenty of fruit and vegetables. Blood tests for lipids and cholesterol should begin at age 20 and be repeated every 5 years. If your lipid or cholesterol levels are high, you are over 50, or you are at high risk for heart disease, you may need your cholesterol levels checked more frequently.Ongoing high lipid and cholesterol levels should be treated with medicines if diet and exercise are not working.  If you smoke, find out from your health care provider how to quit. If you do not use tobacco, do not start.  Lung cancer screening is recommended for adults aged 55-80 years who are at high risk for developing lung cancer because of a history of smoking. A yearly low-dose CT scan of the lungs is recommended for people who have at least a 30-pack-year history of smoking and are a current smoker or have quit within the past 15 years. A pack year of smoking is smoking an average of 1 pack of cigarettes a day for 1 year (for example: 1 pack a day for 30 years or 2 packs a day for 15 years). Yearly screening should continue until the smoker has stopped smoking for at least 15 years. Yearly screening should be stopped for people who develop a health problem that would prevent them from having lung cancer treatment.  If you choose to drink alcohol, do not have more than 2 drinks per day. One drink is considered to be 12 ounces (355 mL) of beer, 5 ounces (148 mL) of wine, or 1.5 ounces (44 mL) of liquor.  Avoid use of street drugs. Do not share needles with anyone. Ask for help if you need support or  instructions about stopping the use of drugs.  High blood pressure causes heart disease and increases the risk of stroke. Your blood pressure should be checked at least every 1-2 years. Ongoing high blood pressure should be treated with medicines, if weight loss and exercise are not effective.  If you are 45-79 years old, ask your health care provider if you should take aspirin to prevent heart disease.  Diabetes screening involves taking a blood sample to check your fasting blood sugar level. This should be done once every 3 years, after age 45, if you are within normal weight and without risk factors for diabetes. Testing should be considered at a younger age or be carried out more frequently if you are overweight and have at least 1 risk factor for diabetes.  Colorectal cancer can be detected and often prevented. Most routine colorectal cancer screening begins at the age of 50 and continues through age 75. However, your health care provider may recommend screening at an earlier age if you have   risk factors for colon cancer. On a yearly basis, your health care provider may provide home test kits to check for hidden blood in the stool. Use of a small camera at the end of a tube to directly examine the colon (sigmoidoscopy or colonoscopy) can detect the earliest forms of colorectal cancer. Talk to your health care provider about this at age 50, when routine screening begins. Direct exam of the colon should be repeated every 5-10 years through age 75, unless early forms of precancerous polyps or small growths are found.   Talk with your health care provider about prostate cancer screening.  Testicular cancer screening isrecommended for adult males. Screening includes self-exam, a health care provider exam, and other screening tests. Consult with your health care provider about any symptoms you have or any concerns you have about testicular cancer.  Use sunscreen. Apply sunscreen liberally and repeatedly  throughout the day. You should seek shade when your shadow is shorter than you. Protect yourself by wearing long sleeves, pants, a wide-brimmed hat, and sunglasses year round, whenever you are outdoors.  Once a month, do a whole-body skin exam, using a mirror to look at the skin on your back. Tell your health care provider about new moles, moles that have irregular borders, moles that are larger than a pencil eraser, or moles that have changed in shape or color.  Stay current with required vaccines (immunizations).  Influenza vaccine. All adults should be immunized every year.  Tetanus, diphtheria, and acellular pertussis (Td, Tdap) vaccine. An adult who has not previously received Tdap or who does not know his vaccine status should receive 1 dose of Tdap. This initial dose should be followed by tetanus and diphtheria toxoids (Td) booster doses every 10 years. Adults with an unknown or incomplete history of completing a 3-dose immunization series with Td-containing vaccines should begin or complete a primary immunization series including a Tdap dose. Adults should receive a Td booster every 10 years.  Varicella vaccine. An adult without evidence of immunity to varicella should receive 2 doses or a second dose if he has previously received 1 dose.  Human papillomavirus (HPV) vaccine. Males aged 13-21 years who have not received the vaccine previously should receive the 3-dose series. Males aged 22-26 years may be immunized. Immunization is recommended through the age of 26 years for any male who has sex with males and did not get any or all doses earlier. Immunization is recommended for any person with an immunocompromised condition through the age of 26 years if he did not get any or all doses earlier. During the 3-dose series, the second dose should be obtained 4-8 weeks after the first dose. The third dose should be obtained 24 weeks after the first dose and 16 weeks after the second dose.  Zoster  vaccine. One dose is recommended for adults aged 60 years or older unless certain conditions are present.    PREVNAR  - Pneumococcal 13-valent conjugate (PCV13) vaccine. When indicated, a person who is uncertain of his immunization history and has no record of immunization should receive the PCV13 vaccine. An adult aged 19 years or older who has certain medical conditions and has not been previously immunized should receive 1 dose of PCV13 vaccine. This PCV13 should be followed with a dose of pneumococcal polysaccharide (PPSV23) vaccine. The PPSV23 vaccine dose should be obtained at least 8 weeks after the dose of PCV13 vaccine. An adult aged 19 years or older who has certain medical conditions and previously   received 1 or more doses of PPSV23 vaccine should receive 1 dose of PCV13. The PCV13 vaccine dose should be obtained 1 or more years after the last PPSV23 vaccine dose.    PNEUMOVAX - Pneumococcal polysaccharide (PPSV23) vaccine. When PCV13 is also indicated, PCV13 should be obtained first. All adults aged 65 years and older should be immunized. An adult younger than age 65 years who has certain medical conditions should be immunized. Any person who resides in a nursing home or long-term care facility should be immunized. An adult smoker should be immunized. People with an immunocompromised condition and certain other conditions should receive both PCV13 and PPSV23 vaccines. People with human immunodeficiency virus (HIV) infection should be immunized as soon as possible after diagnosis. Immunization during chemotherapy or radiation therapy should be avoided. Routine use of PPSV23 vaccine is not recommended for American Indians, [place] Natives, or people younger than 65 years unless there are medical conditions that require PPSV23 vaccine. When indicated, people who have unknown immunization and have no record of immunization should receive PPSV23 vaccine. One-time revaccination 5 years after the first  dose of PPSV23 is recommended for people aged 19-64 years who have chronic kidney failure, nephrotic syndrome, asplenia, or immunocompromised conditions. People who received 1-2 doses of PPSV23 before age 65 years should receive another dose of PPSV23 vaccine at age 65 years or later if at least 5 years have passed since the previous dose. Doses of PPSV23 are not needed for people immunized with PPSV23 at or after age 65 years.    Hepatitis A vaccine. Adults who wish to be protected from this disease, have certain high-risk conditions, work with hepatitis A-infected animals, work in hepatitis A research labs, or travel to or work in countries with a high rate of hepatitis A should be immunized. Adults who were previously unvaccinated and who anticipate close contact with an international adoptee during the first 60 days after arrival in the [place] from a country with a high rate of hepatitis A should be immunized.    Hepatitis B vaccine. Adults should be immunized if they wish to be protected from this disease, have certain high-risk conditions, may be exposed to blood or other infectious body fluids, are household contacts or sex partners of hepatitis B positive people, are clients or workers in certain care facilities, or travel to or work in countries with a high rate of hepatitis B.   Preventive Service / Frequency   Ages 40 to 64  Blood pressure check.  Lipid and cholesterol check  Lung cancer screening. / Every year if you are aged 55-80 years and have a 30-pack-year history of smoking and currently smoke or have quit within the past 15 years. Yearly screening is stopped once you have quit smoking for at least 15 years or develop a health problem that would prevent you from having lung cancer treatment.  Fecal occult blood test (FOBT) of stool. / Every year beginning at age 50 and continuing until age 75. You may not have to do this test if you get a colonoscopy every 10  years.  Flexible sigmoidoscopy** or colonoscopy.** / Every 5 years for a flexible sigmoidoscopy or every 10 years for a colonoscopy beginning at age 50 and continuing until age 75. Screening for abdominal aortic aneurysm (AAA)  by ultrasound is recommended for people who have history of high blood pressure or who are current or former smokers.   
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
## 2014-08-03 LAB — LIPID PANEL
Cholesterol: 150 mg/dL (ref 0–200)
HDL: 25 mg/dL — ABNORMAL LOW (ref >=40)
LDL Cholesterol: 64 mg/dL (ref 0–99)
Total CHOL/HDL Ratio: 6 Ratio
Triglycerides: 304 mg/dL — ABNORMAL HIGH (ref <150)
VLDL: 61 mg/dL — ABNORMAL HIGH (ref 0–40)

## 2014-08-03 LAB — BASIC METABOLIC PANEL WITH GFR
BUN: 13 mg/dL (ref 6–23)
CALCIUM: 8.8 mg/dL (ref 8.4–10.5)
CO2: 27 mEq/L (ref 19–32)
Chloride: 103 mEq/L (ref 96–112)
Creat: 0.75 mg/dL (ref 0.50–1.35)
Glucose, Bld: 128 mg/dL — ABNORMAL HIGH (ref 70–99)
Potassium: 4.5 mEq/L (ref 3.5–5.3)
Sodium: 141 mEq/L (ref 135–145)

## 2014-08-03 LAB — URINALYSIS, MICROSCOPIC ONLY
Bacteria, UA: NONE SEEN
Casts: NONE SEEN
Crystals: NONE SEEN
Squamous Epithelial / LPF: NONE SEEN

## 2014-08-03 LAB — MICROALBUMIN / CREATININE URINE RATIO
Creatinine, Urine: 207.5 mg/dL
MICROALB UR: 0.5 mg/dL (ref <2.0)
MICROALB/CREAT RATIO: 2.4 mg/g (ref 0.0–30.0)

## 2014-08-03 LAB — HEMOGLOBIN A1C
HEMOGLOBIN A1C: 6.1 % — AB (ref <5.7)
Mean Plasma Glucose: 128 mg/dL — ABNORMAL HIGH (ref <117)

## 2014-08-03 LAB — MAGNESIUM: MAGNESIUM: 1.9 mg/dL (ref 1.5–2.5)

## 2014-08-03 LAB — HEPATIC FUNCTION PANEL
ALT: 23 U/L (ref 0–53)
AST: 15 U/L (ref 0–37)
Albumin: 3.6 g/dL (ref 3.5–5.2)
Alkaline Phosphatase: 67 U/L (ref 39–117)
BILIRUBIN INDIRECT: 0.7 mg/dL (ref 0.2–1.2)
Bilirubin, Direct: 0.2 mg/dL (ref 0.0–0.3)
Total Bilirubin: 0.9 mg/dL (ref 0.2–1.2)
Total Protein: 6.1 g/dL (ref 6.0–8.3)

## 2014-08-03 LAB — IRON AND TIBC
%SAT: 26 % (ref 20–55)
IRON: 80 ug/dL (ref 42–165)
TIBC: 307 ug/dL (ref 215–435)
UIBC: 227 ug/dL (ref 125–400)

## 2014-08-03 LAB — INSULIN, FASTING: Insulin fasting, serum: 226.7 u[IU]/mL — ABNORMAL HIGH (ref 2.0–19.6)

## 2014-08-03 LAB — PSA: PSA: 0.32 ng/mL (ref <=4.00)

## 2014-08-03 LAB — TSH: TSH: 0.766 u[IU]/mL (ref 0.350–4.500)

## 2014-08-03 LAB — VITAMIN B12: Vitamin B-12: 1207 pg/mL — ABNORMAL HIGH (ref 211–911)

## 2014-08-03 LAB — VITAMIN D 25 HYDROXY (VIT D DEFICIENCY, FRACTURES): Vit D, 25-Hydroxy: 49 ng/mL (ref 30–100)

## 2014-08-03 LAB — TESTOSTERONE: TESTOSTERONE: 136 ng/dL — AB (ref 300–890)

## 2014-08-15 ENCOUNTER — Encounter: Payer: Self-pay | Admitting: Internal Medicine

## 2014-08-15 ENCOUNTER — Ambulatory Visit (INDEPENDENT_AMBULATORY_CARE_PROVIDER_SITE_OTHER): Payer: BLUE CROSS/BLUE SHIELD | Admitting: Internal Medicine

## 2014-08-15 VITALS — BP 162/88 | HR 96 | Temp 98.4°F | Resp 18 | Ht 72.25 in | Wt 375.0 lb

## 2014-08-15 DIAGNOSIS — L03211 Cellulitis of face: Secondary | ICD-10-CM

## 2014-08-15 MED ORDER — CEFTRIAXONE SODIUM 1 G IJ SOLR
1.0000 g | Freq: Once | INTRAMUSCULAR | Status: AC
  Administered 2014-08-15: 1 g via INTRAMUSCULAR

## 2014-08-15 MED ORDER — DOXYCYCLINE HYCLATE 100 MG PO CAPS: 100.0000 mg | ORAL_CAPSULE | Freq: Two times a day (BID) | ORAL | Status: DC

## 2014-08-15 NOTE — Patient Instructions (Signed)

## 2014-08-15 NOTE — Progress Notes (Signed)
   Subjective:    Patient ID: Mark Harrell, male    DOB: 06/20/1960, 54 y.o.   MRN: [id number]  HPI  Patient has noticed for the past three days he has had a very painful area on his face.  It started off as feeling like a pimple on his right cheek.  He was cleaning the area with alcohol.  It has been getting more red and an ulceration popped up today.  It is very hard to the touch and painful.  He doesn't feel anything inside of his mouth.  He has noted no drainage from the area.  He has not been picking at the area.  He does report some low grade fevers at home.  He has no history of abscesses or boils that have required lancing in the past.    Review of Systems  Constitutional: Positive for fever and chills. Negative for fatigue.  HENT: Positive for facial swelling.   Gastrointestinal: Positive for diarrhea. Negative for nausea, vomiting and constipation.  Skin: Positive for wound.       Objective:   Physical Exam  Constitutional: He is oriented to person, place, and time. He appears well-developed and well-nourished. No distress.  HENT:  Head: Normocephalic and atraumatic.    Nose: Nose normal.  Mouth/Throat: Uvula is midline, oropharynx is clear and moist and mucous membranes are normal. No trismus in the jaw. No oropharyngeal exudate.  Eyes: Conjunctivae and EOM are normal. Pupils are equal, round, and reactive to light. No scleral icterus.  Neck: Normal range of motion. Neck supple. No JVD present. No thyromegaly present.  Cardiovascular: Normal rate, regular rhythm, normal heart sounds and intact distal pulses.  Exam reveals no gallop and no friction rub.   No murmur heard. Pulmonary/Chest: Effort normal and breath sounds normal. No respiratory distress. He has no wheezes. He has no rales. He exhibits no tenderness.  Musculoskeletal: Normal range of motion.  Lymphadenopathy:    He has no cervical adenopathy.  Neurological: He is alert and oriented to person, place, and time.    Skin: Skin is warm and dry. He is not diaphoretic.  Psychiatric: He has a normal mood and affect. His behavior is normal. Judgment and thought content normal.  Nursing note and vitals reviewed.   Filed Vitals:   08/15/14 0950  BP: 162/88  Pulse: 96  Temp: 98.4 F (36.9 C)  Resp: 18          Assessment & Plan:    1. Cellulitis of face There is an indurated cellulitis vs. Early absess of the face.  Vitals are normal and stable.  No palpable fluctuance so will not do and I&D at this time.  Patient given 1 g ceftriaxone here and will give doxycycline 100 mg BID x 10 days.  Patient to use warm compresses.  Patient to return for recheck in 2 days.  Patient to call if increased redness, swelling under the jaw, trismus, fever, or problems moving his eyes.  He states understanding.    - cefTRIAXone (ROCEPHIN) injection 1 g; Inject 1 g into the muscle once. - doxycycline 100 mg BID x 10 days -warm compresses.

## 2014-08-17 ENCOUNTER — Ambulatory Visit (INDEPENDENT_AMBULATORY_CARE_PROVIDER_SITE_OTHER): Payer: BLUE CROSS/BLUE SHIELD | Admitting: Internal Medicine

## 2014-08-17 ENCOUNTER — Encounter: Payer: Self-pay | Admitting: Internal Medicine

## 2014-08-17 VITALS — BP 144/82 | HR 72 | Temp 98.2°F | Resp 18 | Ht 72.25 in

## 2014-08-17 DIAGNOSIS — L03211 Cellulitis of face: Secondary | ICD-10-CM

## 2014-08-17 DIAGNOSIS — L0201 Cutaneous abscess of face: Secondary | ICD-10-CM

## 2014-08-17 NOTE — Progress Notes (Signed)
   Subjective:    Patient ID: Mark Harrell, male    DOB: 1960/06/14, 54 y.o.   MRN: [id number]  HPI  Patient returns to the office today for evaluation of facial cellulitis.  He reports that he had some drainage from his face yesterday and has had no further drainage from the area.  He reports that he is still tired and feeling rundown but feels like the place on his face may be doing betters.  He reports that he is taking the doxycycline and is also using a lot of warm compresses.    Review of Systems  Constitutional: Negative for fever, chills and fatigue.  HENT: Negative for mouth sores and trouble swallowing.   Gastrointestinal: Negative for nausea and vomiting.  Skin: Positive for rash and wound.  All other systems reviewed and are negative.      Objective:   Physical Exam  Constitutional: He is oriented to person, place, and time. He appears well-developed and well-nourished. No distress.  HENT:  Head: Normocephalic and atraumatic.  Mouth/Throat: Oropharynx is clear and moist. No oropharyngeal exudate.  2 cm x 3 cm erythematous nodule with some fluctuance to palpation.  No active drainage.  No mouth involvement.  Tender to palpation.    Eyes: Conjunctivae and EOM are normal. Pupils are equal, round, and reactive to light. No scleral icterus.  Cardiovascular: Normal rate, regular rhythm, normal heart sounds and intact distal pulses.  Exam reveals no gallop and no friction rub.   No murmur heard. Pulmonary/Chest: Effort normal and breath sounds normal. No respiratory distress. He has no wheezes. He has no rales. He exhibits no tenderness.  Neurological: He is alert and oriented to person, place, and time.  Skin: Skin is warm and dry. He is not diaphoretic.  Psychiatric: He has a normal mood and affect. His behavior is normal. Judgment and thought content normal.  Nursing note and vitals reviewed.   Procedure  Incision and Drainage:   Risks and benefits discussed with the  patient including pain, scarring, worsening infection. Patient gave verbal agreement to procedure  Location:  Right cheek Skin prep:  Alcohol, semisterile procedure and draping Anesthesia:  1.5 cc 1% lidocaine w/o epinephrine for local infiltration Scalpel:  11 blade Incision:  0.5 cm superficial cruciate incision made Drainage:  Sanguinous in appearance Packing:  None Dissection:  Loculations attempted to be broken with hemostat Patient tolerated procedure well. Wound was rinsed with hydrogen peroxide and was dressed with gauze and paper tape.   Instructions for wound care given.         Assessment & Plan:    1. Facial abscess -Improving -I&D performed here with minimal sanguinous drainage, given shallow nature did not pack.  -cont warm compresses and gentle massage  2. Facial cellulitis -cont doxy -will recheck Monday or Tuesday -if worsens, neck swelling, difficulty swallowing, increasing signs of infection patient is to go to the ER.  Patient states understanding.

## 2014-08-17 NOTE — Patient Instructions (Signed)

## 2014-08-21 ENCOUNTER — Ambulatory Visit (INDEPENDENT_AMBULATORY_CARE_PROVIDER_SITE_OTHER): Payer: BLUE CROSS/BLUE SHIELD | Admitting: Internal Medicine

## 2014-08-21 ENCOUNTER — Encounter: Payer: Self-pay | Admitting: Internal Medicine

## 2014-08-21 ENCOUNTER — Ambulatory Visit: Payer: Self-pay | Admitting: Internal Medicine

## 2014-08-21 VITALS — BP 120/80 | HR 88 | Temp 97.8°F | Resp 16 | Ht 72.0 in | Wt 365.0 lb

## 2014-08-21 DIAGNOSIS — L03211 Cellulitis of face: Secondary | ICD-10-CM

## 2014-08-21 MED ORDER — FAMOTIDINE 20 MG PO TABS: 20.0000 mg | ORAL_TABLET | Freq: Two times a day (BID) | ORAL | Status: DC

## 2014-08-21 NOTE — Patient Instructions (Signed)

## 2014-08-21 NOTE — Progress Notes (Signed)
   Subjective:    Patient ID: Mark Harrell, male    DOB: 1961/03/08, 54 y.o.   MRN: [id number]  Abscess Associated symptoms include coughing and a fever. Pertinent negatives include no chest pain, chills or fatigue.   Patient returns for recheck of facial abscess and cellulitis.  Patient reports that he has been doing well.  The swelling has gone down.  He reports no further drainage or swelling.  He does note that he has still been using warm compresses and has been taking the doxycycline.  He has several more days of the medication left.  He does report some upset stomach with the medication.  He reports taking some peptobismol which kind of helps.  He also reports that he does have some sputum production.  He feels that this has been going worse.     Review of Systems  Constitutional: Positive for fever. Negative for chills and fatigue.  HENT: Positive for facial swelling.   Respiratory: Positive for cough. Negative for chest tightness and shortness of breath.   Cardiovascular: Negative for chest pain, palpitations and leg swelling.       Objective:   Physical Exam  Constitutional: He is oriented to person, place, and time. He appears well-developed and well-nourished. No distress.  HENT:  Head: Normocephalic and atraumatic.    Mouth/Throat: Oropharynx is clear and moist. No oropharyngeal exudate.  Eyes: Conjunctivae and EOM are normal. Pupils are equal, round, and reactive to light. No scleral icterus.  Neck: Normal range of motion. Neck supple. No JVD present. No thyromegaly present.  Cardiovascular: Normal rate, regular rhythm, normal heart sounds and intact distal pulses.  Exam reveals no gallop and no friction rub.   No murmur heard. Pulmonary/Chest: Effort normal. No respiratory distress. He has no wheezes. He has no rales. He exhibits no tenderness.  Musculoskeletal: Normal range of motion.  Lymphadenopathy:    He has no cervical adenopathy.  Neurological: He is alert and  oriented to person, place, and time.  Skin: Skin is warm and dry. He is not diaphoretic.  Psychiatric: He has a normal mood and affect. His behavior is normal. Judgment and thought content normal.  Nursing note and vitals reviewed.         Assessment & Plan:    1. Facial cellulitis -finish doxy -warm compresses -dramatically improving.  Complete abx. -return prn

## 2014-08-28 ENCOUNTER — Encounter: Payer: Self-pay | Admitting: Internal Medicine

## 2014-08-29 ENCOUNTER — Ambulatory Visit (INDEPENDENT_AMBULATORY_CARE_PROVIDER_SITE_OTHER): Payer: BLUE CROSS/BLUE SHIELD | Admitting: Internal Medicine

## 2014-08-29 ENCOUNTER — Other Ambulatory Visit: Payer: Self-pay | Admitting: *Deleted

## 2014-08-29 ENCOUNTER — Encounter: Payer: Self-pay | Admitting: Internal Medicine

## 2014-08-29 VITALS — BP 126/70 | HR 82 | Temp 98.4°F | Resp 18 | Ht 72.0 in | Wt 380.0 lb

## 2014-08-29 DIAGNOSIS — J45901 Unspecified asthma with (acute) exacerbation: Secondary | ICD-10-CM

## 2014-08-29 DIAGNOSIS — J309 Allergic rhinitis, unspecified: Secondary | ICD-10-CM

## 2014-08-29 MED ORDER — PREDNISONE 20 MG PO TABS: ORAL_TABLET | ORAL | Status: DC

## 2014-08-29 MED ORDER — CEPHALEXIN 500 MG PO CAPS: 500.0000 mg | ORAL_CAPSULE | Freq: Four times a day (QID) | ORAL | Status: AC

## 2014-08-29 MED ORDER — AZELASTINE HCL 0.1 % NA SOLN: 2.0000 | Freq: Two times a day (BID) | NASAL | Status: DC

## 2014-08-29 MED ORDER — MOMETASONE FURO-FORMOTEROL FUM 100-5 MCG/ACT IN AERO: 2.0000 | INHALATION_SPRAY | Freq: Every day | RESPIRATORY_TRACT | Status: DC

## 2014-08-29 NOTE — Patient Instructions (Signed)

## 2014-08-29 NOTE — Progress Notes (Signed)
Subjective:    Patient ID: Mark Harrell, male    DOB: 1960-12-02, 54 y.o.   MRN: [id number]  Asthma He complains of cough, shortness of breath and wheezing. Associated symptoms include postnasal drip and rhinorrhea. Pertinent negatives include no chest pain, fever or sore throat. His past medical history is significant for asthma.   Patient presents to the office for evaluation of wheezing. He reports that for the past two days.  He reports that the wheeze is intermittent.  It is worse with laying down and with exercise.  He has been using the albuterol inhaler every four hours.  He does report that it helps a little but not a lot.  He also has been sitting in steam which helps.  He reports that the lump on his face is still there.  He has finished the doxycycline.  He reports that it has not drained.  It is no smaller or bigger.  He would like to have it lanced again.      Review of Systems  Constitutional: Positive for fatigue. Negative for fever and chills.  HENT: Positive for congestion, postnasal drip and rhinorrhea. Negative for sinus pressure and sore throat.   Eyes: Positive for discharge (watery eyes) and itching.  Respiratory: Positive for cough, chest tightness, shortness of breath and wheezing.   Cardiovascular: Negative for chest pain, palpitations and leg swelling.       Objective:   Physical Exam  Constitutional: He is oriented to person, place, and time. He appears well-developed and well-nourished. No distress.  HENT:  Head: Normocephalic and atraumatic.  Mouth/Throat: Oropharynx is clear and moist. No oropharyngeal exudate.  Eyes: Conjunctivae and EOM are normal. Pupils are equal, round, and reactive to light. No scleral icterus.  Neck: Normal range of motion. Neck supple. No JVD present. No thyromegaly present.  Cardiovascular: Normal rate, regular rhythm, normal heart sounds and intact distal pulses.  Exam reveals no gallop and no friction rub.   No murmur  heard. Pulmonary/Chest: Effort normal. No respiratory distress. He has wheezes (forced end expiratory wheeze in the pretracheal area. Not heard well in lungs). He has no rales. He exhibits no tenderness.  Musculoskeletal: Normal range of motion.  Lymphadenopathy:    He has no cervical adenopathy.  Neurological: He is alert and oriented to person, place, and time.  Skin: Skin is warm and dry. He is not diaphoretic.  Small non-erythematous palpable nodule with no tenderness to palpation.  No drainage, discharge or warmth.  Psychiatric: He has a normal mood and affect. His behavior is normal. Judgment and thought content normal.  Nursing note and vitals reviewed.         Assessment & Plan:    1. Allergic rhinitis, unspecified allergic rhinitis type  - azelastine (ASTELIN) 0.1 % nasal spray; Place 2 sprays into both nostrils 2 (two) times daily.  Dispense: 30 mL; Refill: 12 - predniSONE (DELTASONE) 20 MG tablet; 3 tabs po day one, then 2 tabs daily x 4 days  Dispense: 11 tablet; Refill: 0 - mometasone-formoterol (DULERA) 100-5 MCG/ACT AERO; Inhale 2 puffs into the lungs daily.  Dispense: 13 g; Refill: 3  2. Asthma with acute exacerbation, unspecified asthma severity  - predniSONE (DELTASONE) 20 MG tablet; 3 tabs po day one, then 2 tabs daily x 4 days  Dispense: 11 tablet; Refill: 0 - mometasone-formoterol (DULERA) 100-5 MCG/ACT AERO; Inhale 2 puffs into the lungs daily.  Dispense: 13 g; Refill: 3  3.  Facial nodule -keflex -warm  compresses

## 2014-09-28 ENCOUNTER — Ambulatory Visit (INDEPENDENT_AMBULATORY_CARE_PROVIDER_SITE_OTHER): Payer: BLUE CROSS/BLUE SHIELD | Admitting: Internal Medicine

## 2014-09-28 ENCOUNTER — Encounter: Payer: Self-pay | Admitting: Internal Medicine

## 2014-09-28 VITALS — BP 126/84 | HR 68 | Temp 97.7°F | Resp 16 | Ht 72.5 in | Wt 366.2 lb

## 2014-09-28 DIAGNOSIS — D485 Neoplasm of uncertain behavior of skin: Secondary | ICD-10-CM

## 2014-09-28 DIAGNOSIS — I10 Essential (primary) hypertension: Secondary | ICD-10-CM

## 2014-09-28 NOTE — Progress Notes (Signed)
   Subjective:    Patient ID: Mark Harrell, male    DOB: 11-Jun-1960, 54 y.o.   MRN: [id number]  HPI Present with hx/o HTN present for evaluation and also has concerns re a non-healing skin lesion of his left cheek.   Meds, Allergies, PMH/PSH reviewed  Review of Systems 10 point systems review negative except as above.    Objective:   Physical Exam   BP 126/84   Pulse 68  Temp 97.7 F  Resp 16  Ht 6' 0.5"   Wt 366 lb 3.2 oz (   BMI 48.96   HEENT neg Neck - supple. Nl Thyroid. Carotids 2+ & No bruits, nodes, JVD Chest - Clear equal BS w/o Rales, rhonchi, wheezes. Cor - Nl HS. RRR w/o sig MGR. PP 1(+). No edema. Abd - No palpable organomegaly, masses or tenderness. BS nl. MS- FROM w/o deformities. Muscle power, tone and bulk Nl. Gait Nl. Neuro - No obvious Cr N abnormalities. Sensory, motor and Cerebellar functions appear Nl w/o focal abnormalities. Psyche - Mental status normal & appropriate.  No delusions, ideations or obvious mood abnormalities.  Skin - circumscribed 5 mm x 5 mm  crusted erythematous ulceration of the right cheek suspect for actinic or solar keratoses. After informed consent the area was treated with liq Nitrogen by triple freeze-thaw technique which patient tolerated well. (Procedure 17000).     Assessment & Plan:   1. Essential hypertension  - continue Tx same  2. Neoplasm of uncertain behavior of skin  - treated by cryo surg (Procedure 17000) and patient instructed in po care.

## 2014-11-02 ENCOUNTER — Encounter: Payer: Self-pay | Admitting: Internal Medicine

## 2014-11-02 ENCOUNTER — Ambulatory Visit (INDEPENDENT_AMBULATORY_CARE_PROVIDER_SITE_OTHER): Payer: BLUE CROSS/BLUE SHIELD | Admitting: Internal Medicine

## 2014-11-02 VITALS — BP 122/68 | HR 80 | Temp 98.4°F | Resp 18 | Ht 72.0 in | Wt 367.0 lb

## 2014-11-02 DIAGNOSIS — I10 Essential (primary) hypertension: Secondary | ICD-10-CM

## 2014-11-02 DIAGNOSIS — E559 Vitamin D deficiency, unspecified: Secondary | ICD-10-CM

## 2014-11-02 DIAGNOSIS — E785 Hyperlipidemia, unspecified: Secondary | ICD-10-CM

## 2014-11-02 DIAGNOSIS — J309 Allergic rhinitis, unspecified: Secondary | ICD-10-CM

## 2014-11-02 DIAGNOSIS — Z79899 Other long term (current) drug therapy: Secondary | ICD-10-CM

## 2014-11-02 DIAGNOSIS — Z23 Encounter for immunization: Secondary | ICD-10-CM

## 2014-11-02 DIAGNOSIS — E66813 Obesity, class 3: Secondary | ICD-10-CM

## 2014-11-02 DIAGNOSIS — R7309 Other abnormal glucose: Secondary | ICD-10-CM

## 2014-11-02 DIAGNOSIS — R7303 Prediabetes: Secondary | ICD-10-CM

## 2014-11-02 DIAGNOSIS — J45901 Unspecified asthma with (acute) exacerbation: Secondary | ICD-10-CM

## 2014-11-02 LAB — CBC WITH DIFFERENTIAL/PLATELET
BASOS PCT: 1 % (ref 0–1)
Basophils Absolute: 0.1 10*3/uL (ref 0.0–0.1)
Eosinophils Absolute: 0.2 10*3/uL (ref 0.0–0.7)
Eosinophils Relative: 3 % (ref 0–5)
HEMATOCRIT: 44.1 % (ref 39.0–52.0)
HEMOGLOBIN: 14.6 g/dL (ref 13.0–17.0)
Lymphocytes Relative: 35 % (ref 12–46)
Lymphs Abs: 2.7 10*3/uL (ref 0.7–4.0)
MCH: 29.1 pg (ref 26.0–34.0)
MCHC: 33.1 g/dL (ref 30.0–36.0)
MCV: 87.8 fL (ref 78.0–100.0)
MONO ABS: 0.8 10*3/uL (ref 0.1–1.0)
MONOS PCT: 10 % (ref 3–12)
MPV: 10.2 fL (ref 8.6–12.4)
Neutro Abs: 3.9 10*3/uL (ref 1.7–7.7)
Neutrophils Relative %: 51 % (ref 43–77)
Platelets: 294 10*3/uL (ref 150–400)
RBC: 5.02 MIL/uL (ref 4.22–5.81)
RDW: 13.7 % (ref 11.5–15.5)
WBC: 7.7 10*3/uL (ref 4.0–10.5)

## 2014-11-02 LAB — LIPID PANEL
Cholesterol: 110 mg/dL (ref 0–200)
HDL: 22 mg/dL — ABNORMAL LOW (ref >=40)
LDL CALC: 40 mg/dL (ref 0–99)
TRIGLYCERIDES: 238 mg/dL — AB (ref <150)
Total CHOL/HDL Ratio: 5 Ratio
VLDL: 48 mg/dL — ABNORMAL HIGH (ref 0–40)

## 2014-11-02 LAB — BASIC METABOLIC PANEL WITH GFR
BUN: 13 mg/dL (ref 6–23)
CALCIUM: 8.7 mg/dL (ref 8.4–10.5)
CO2: 25 mEq/L (ref 19–32)
CREATININE: 0.73 mg/dL (ref 0.50–1.35)
Chloride: 108 mEq/L (ref 96–112)
GFR, Est African American: >89 mL/min
GFR, Est Non African American: >89 mL/min
Glucose, Bld: 106 mg/dL — ABNORMAL HIGH (ref 70–99)
Potassium: 4.3 mEq/L (ref 3.5–5.3)
SODIUM: 140 meq/L (ref 135–145)

## 2014-11-02 LAB — HEPATIC FUNCTION PANEL
ALBUMIN: 3.4 g/dL — AB (ref 3.5–5.2)
ALT: 24 U/L (ref 0–53)
AST: 15 U/L (ref 0–37)
Alkaline Phosphatase: 61 U/L (ref 39–117)
BILIRUBIN TOTAL: 0.9 mg/dL (ref 0.2–1.2)
Bilirubin, Direct: 0.2 mg/dL (ref 0.0–0.3)
Indirect Bilirubin: 0.7 mg/dL (ref 0.2–1.2)
TOTAL PROTEIN: 5.8 g/dL — AB (ref 6.0–8.3)

## 2014-11-02 LAB — MAGNESIUM: Magnesium: 1.9 mg/dL (ref 1.5–2.5)

## 2014-11-02 LAB — TSH: TSH: 0.688 u[IU]/mL (ref 0.350–4.500)

## 2014-11-02 MED ORDER — MOMETASONE FURO-FORMOTEROL FUM 200-5 MCG/ACT IN AERO: 2.0000 | INHALATION_SPRAY | Freq: Two times a day (BID) | RESPIRATORY_TRACT | Status: DC
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
## 2014-11-02 NOTE — Patient Instructions (Addendum)
Please cut your Atenolol in half and take it at your normal time.  Monitor your Blood pressure at home and if it is over 160/90 please take the other half.  Please use your dulera twice daily once in the morning and once in the evening.    Please add more exercise in as you can tolerate it.

## 2014-11-03 ENCOUNTER — Other Ambulatory Visit: Payer: Self-pay | Admitting: Emergency Medicine

## 2014-11-03 ENCOUNTER — Other Ambulatory Visit: Payer: Self-pay | Admitting: Internal Medicine

## 2014-11-03 LAB — HEMOGLOBIN A1C
HEMOGLOBIN A1C: 6 % — AB (ref <5.7)
Mean Plasma Glucose: 126 mg/dL — ABNORMAL HIGH (ref <117)

## 2014-11-03 LAB — INSULIN, RANDOM: Insulin: 105.8 u[IU]/mL — ABNORMAL HIGH (ref 2.0–19.6)

## 2014-11-03 LAB — VITAMIN D 25 HYDROXY (VIT D DEFICIENCY, FRACTURES): VIT D 25 HYDROXY: 43 ng/mL (ref 30–100)

## 2014-11-03 MED ORDER — METFORMIN HCL ER 500 MG PO TB24: ORAL_TABLET | ORAL | Status: DC

## 2014-11-20 ENCOUNTER — Other Ambulatory Visit: Payer: Self-pay

## 2015-02-05 ENCOUNTER — Ambulatory Visit (INDEPENDENT_AMBULATORY_CARE_PROVIDER_SITE_OTHER): Payer: BLUE CROSS/BLUE SHIELD | Admitting: Internal Medicine

## 2015-02-05 ENCOUNTER — Encounter: Payer: Self-pay | Admitting: Internal Medicine

## 2015-02-05 VITALS — BP 124/84 | HR 76 | Temp 97.7°F | Resp 18 | Ht 72.5 in | Wt 358.0 lb

## 2015-02-05 DIAGNOSIS — E291 Testicular hypofunction: Secondary | ICD-10-CM

## 2015-02-05 DIAGNOSIS — Z79899 Other long term (current) drug therapy: Secondary | ICD-10-CM

## 2015-02-05 DIAGNOSIS — Z6841 Body Mass Index (BMI) 40.0 and over, adult: Secondary | ICD-10-CM | POA: Diagnosis not present

## 2015-02-05 DIAGNOSIS — I10 Essential (primary) hypertension: Secondary | ICD-10-CM

## 2015-02-05 DIAGNOSIS — J449 Chronic obstructive pulmonary disease, unspecified: Secondary | ICD-10-CM

## 2015-02-05 DIAGNOSIS — R7309 Other abnormal glucose: Secondary | ICD-10-CM

## 2015-02-05 DIAGNOSIS — F0390 Unspecified dementia without behavioral disturbance: Secondary | ICD-10-CM | POA: Insufficient documentation

## 2015-02-05 DIAGNOSIS — E785 Hyperlipidemia, unspecified: Secondary | ICD-10-CM | POA: Diagnosis not present

## 2015-02-05 DIAGNOSIS — E559 Vitamin D deficiency, unspecified: Secondary | ICD-10-CM | POA: Diagnosis not present

## 2015-02-05 DIAGNOSIS — G4733 Obstructive sleep apnea (adult) (pediatric): Secondary | ICD-10-CM | POA: Insufficient documentation

## 2015-02-05 DIAGNOSIS — J45909 Unspecified asthma, uncomplicated: Secondary | ICD-10-CM | POA: Insufficient documentation

## 2015-02-05 DIAGNOSIS — R7303 Prediabetes: Secondary | ICD-10-CM

## 2015-02-05 LAB — BASIC METABOLIC PANEL WITH GFR
BUN: 9 mg/dL (ref 7–25)
CALCIUM: 8.6 mg/dL (ref 8.6–10.3)
CO2: 33 mmol/L — AB (ref 20–31)
Chloride: 101 mmol/L (ref 98–110)
Creat: 0.68 mg/dL — ABNORMAL LOW (ref 0.70–1.33)
GFR, Est African American: >89 mL/min (ref >=60)
Glucose, Bld: 91 mg/dL (ref 65–99)
Potassium: 4.5 mmol/L (ref 3.5–5.3)
Sodium: 141 mmol/L (ref 135–146)

## 2015-02-05 LAB — HEPATIC FUNCTION PANEL
ALT: 17 U/L (ref 9–46)
AST: 15 U/L (ref 10–35)
Albumin: 3.3 g/dL — ABNORMAL LOW (ref 3.6–5.1)
Alkaline Phosphatase: 61 U/L (ref 40–115)
BILIRUBIN INDIRECT: 0.6 mg/dL (ref 0.2–1.2)
Bilirubin, Direct: 0.2 mg/dL (ref <=0.2)
TOTAL PROTEIN: 5.7 g/dL — AB (ref 6.1–8.1)
Total Bilirubin: 0.8 mg/dL (ref 0.2–1.2)

## 2015-02-05 LAB — LIPID PANEL
CHOLESTEROL: 130 mg/dL (ref 125–200)
HDL: 23 mg/dL — ABNORMAL LOW (ref >=40)
LDL CALC: 52 mg/dL (ref <130)
Total CHOL/HDL Ratio: 5.7 Ratio — ABNORMAL HIGH (ref <=5.0)
Triglycerides: 277 mg/dL — ABNORMAL HIGH (ref <150)
VLDL: 55 mg/dL — AB (ref <30)

## 2015-02-05 LAB — CBC WITH DIFFERENTIAL/PLATELET
Basophils Absolute: 0.1 10*3/uL (ref 0.0–0.1)
Basophils Relative: 1 % (ref 0–1)
Eosinophils Absolute: 0.3 10*3/uL (ref 0.0–0.7)
Eosinophils Relative: 3 % (ref 0–5)
HCT: 42.6 % (ref 39.0–52.0)
HEMOGLOBIN: 14.1 g/dL (ref 13.0–17.0)
LYMPHS ABS: 3.7 10*3/uL (ref 0.7–4.0)
Lymphocytes Relative: 38 % (ref 12–46)
MCH: 29.4 pg (ref 26.0–34.0)
MCHC: 33.1 g/dL (ref 30.0–36.0)
MCV: 88.9 fL (ref 78.0–100.0)
MONOS PCT: 12 % (ref 3–12)
MPV: 10.4 fL (ref 8.6–12.4)
Monocytes Absolute: 1.2 10*3/uL — ABNORMAL HIGH (ref 0.1–1.0)
NEUTROS ABS: 4.5 10*3/uL (ref 1.7–7.7)
Neutrophils Relative %: 46 % (ref 43–77)
Platelets: 300 10*3/uL (ref 150–400)
RBC: 4.79 MIL/uL (ref 4.22–5.81)
RDW: 13.8 % (ref 11.5–15.5)
WBC: 9.8 10*3/uL (ref 4.0–10.5)

## 2015-02-05 LAB — MAGNESIUM: Magnesium: 1.8 mg/dL (ref 1.5–2.5)
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
## 2015-02-05 NOTE — Patient Instructions (Signed)

## 2015-02-06 LAB — TESTOSTERONE: Testosterone: 116 ng/dL — ABNORMAL LOW (ref 300–890)

## 2015-02-06 LAB — HEMOGLOBIN A1C
Hgb A1c MFr Bld: 5.8 % — ABNORMAL HIGH (ref <5.7)
MEAN PLASMA GLUCOSE: 120 mg/dL — AB (ref <117)

## 2015-02-06 LAB — TSH: TSH: 1.31 u[IU]/mL (ref 0.350–4.500)

## 2015-02-06 LAB — INSULIN, RANDOM: Insulin: 42.2 u[IU]/mL — ABNORMAL HIGH (ref 2.0–19.6)

## 2015-02-06 LAB — VITAMIN D 25 HYDROXY (VIT D DEFICIENCY, FRACTURES): VIT D 25 HYDROXY: 60 ng/mL (ref 30–100)

## 2015-03-06 ENCOUNTER — Ambulatory Visit (INDEPENDENT_AMBULATORY_CARE_PROVIDER_SITE_OTHER): Payer: BLUE CROSS/BLUE SHIELD | Admitting: Internal Medicine

## 2015-03-06 ENCOUNTER — Encounter: Payer: Self-pay | Admitting: Internal Medicine

## 2015-03-06 VITALS — BP 130/80 | HR 66 | Temp 98.1°F | Resp 16 | Ht 72.5 in | Wt 355.0 lb

## 2015-03-06 DIAGNOSIS — R7303 Prediabetes: Secondary | ICD-10-CM

## 2015-03-06 DIAGNOSIS — Z6841 Body Mass Index (BMI) 40.0 and over, adult: Secondary | ICD-10-CM

## 2015-03-06 DIAGNOSIS — L820 Inflamed seborrheic keratosis: Secondary | ICD-10-CM | POA: Diagnosis not present

## 2015-03-06 DIAGNOSIS — I10 Essential (primary) hypertension: Secondary | ICD-10-CM

## 2015-03-06 NOTE — Progress Notes (Signed)
Patient ID: Mark Harrell, male   DOB: [birth year], 54 y.o.   MRN: [id number] Chief Complaint: Patient has hx/o of multiple medical co-morbidities including HTN and presents for BP check and also has concerns about several irritated skin lesions. Patient has erythematous raise brown irritated excoriated, scaly lesions on trunk, upper body, upper extremity, chest and Left forearm.   Exam: Patient has multiple (#12) irritated seborrheic keratoses ranging in size from 4 x 6 mm to 8 x 12 mm scattored over th locations noted above.   Procedure Details   The risks, benefits, indications, potential complications, and alternatives were explained to the patient and informed consent obtained.  Liquid nitrogen was used by triple freeze / thaw technique. The patient tolerated the procedure well.   Condition: Stable  Complications:  None  Diagnosis: Actinic Keratosis, irritated  Procedure code:  [id number]  Plan: 1. Patient educated that the area will begin to heal in approximately a week.  2. Warning signs of infection were reviewed.    3. Recommended that the patient use  acetaminophen as needed for pain.

## 2015-04-02 ENCOUNTER — Other Ambulatory Visit: Payer: Self-pay | Admitting: Physician Assistant

## 2015-04-09 ENCOUNTER — Encounter: Payer: Self-pay | Admitting: Physical Therapy

## 2015-04-09 NOTE — Therapy (Signed)
[facility] [address] [place], [place], [address] Phone: [phone]   Fax:  [phone]  Patient Details  Name: Mark Harrell MRN: [id number] Date of Birth: 1961/03/22 Referring Provider:  No ref. provider found  Encounter Date: 04/09/2015   PHYSICAL THERAPY DISCHARGE SUMMARY  Visits from Start of Care: 8  Current functional level related to goals / functional outcomes: Unknown as patient did not return to PT.    Remaining deficits: Unknown   Education / Equipment: HEP / fitness plan.  Plan: Patient agrees to discharge.  Patient goals were not met. Patient is being discharged due to not returning since the last visit.  ?????       Shakiah Wester PT, DPT 04/09/2015, 8:23 AM  [facility] [address] [place], [place], [address] Phone: [phone]   Fax:  [phone]

## 2015-05-10 ENCOUNTER — Encounter: Payer: Self-pay | Admitting: Internal Medicine

## 2015-05-10 ENCOUNTER — Ambulatory Visit (INDEPENDENT_AMBULATORY_CARE_PROVIDER_SITE_OTHER): Payer: BLUE CROSS/BLUE SHIELD | Admitting: Internal Medicine

## 2015-05-10 VITALS — BP 124/78 | HR 88 | Temp 98.4°F | Resp 18 | Ht 72.5 in | Wt 340.0 lb

## 2015-05-10 DIAGNOSIS — E559 Vitamin D deficiency, unspecified: Secondary | ICD-10-CM | POA: Diagnosis not present

## 2015-05-10 DIAGNOSIS — Z79899 Other long term (current) drug therapy: Secondary | ICD-10-CM

## 2015-05-10 DIAGNOSIS — I10 Essential (primary) hypertension: Secondary | ICD-10-CM

## 2015-05-10 DIAGNOSIS — R7303 Prediabetes: Secondary | ICD-10-CM | POA: Diagnosis not present

## 2015-05-10 DIAGNOSIS — E785 Hyperlipidemia, unspecified: Secondary | ICD-10-CM

## 2015-05-10 MED ORDER — METFORMIN HCL ER 500 MG PO TB24: ORAL_TABLET | ORAL | Status: DC

## 2015-05-10 MED ORDER — TRIAMCINOLONE ACETONIDE 0.1 % EX CREA: 1.0000 "application " | TOPICAL_CREAM | Freq: Four times a day (QID) | CUTANEOUS | Status: DC | PRN

## 2015-05-10 NOTE — Progress Notes (Signed)
Patient ID: Mark Harrell, male   DOB: 08-01-1960, 54 y.o.   MRN: [id number]  Assessment and Plan:  Hypertension:  -Continue medication,  -monitor blood pressure at home.  -Continue DASH diet.   -Reminder to go to the ER if any CP, SOB, nausea, dizziness, severe HA, changes vision/speech, left arm numbness and tingling, and jaw pain.  Cholesterol: -Continue diet and exercise.  -Check cholesterol.   Pre-diabetes: -Continue diet and exercise.  -Check A1C  Vitamin D Def: -check level -continue medications.   Chafing -no further abx required -body glide -underwear with support   Continue diet and meds as discussed. Further disposition pending results of labs.  HPI 54 y.o. male  presents for 3 month follow up with hypertension, hyperlipidemia, prediabetes and vitamin D.   His blood pressure has been controlled at home, today their BP is BP: 124/78 mmHg.   He does workout. He denies chest pain, shortness of breath, dizziness.  He does not do much activity   He is on cholesterol medication and denies myalgias. His cholesterol is at goal. The cholesterol last visit was:   Lab Results  Component Value Date   CHOL 130 02/05/2015   HDL 23* 02/05/2015   LDLCALC 52 02/05/2015   TRIG 277* 02/05/2015   CHOLHDL 5.7* 02/05/2015     He has been working on diet and exercise for prediabetes, and denies foot ulcerations, hyperglycemia, hypoglycemia , increased appetite, nausea, paresthesia of the feet, polydipsia, polyuria, visual disturbances, vomiting and weight loss. Last A1C in the office was:  Lab Results  Component Value Date   HGBA1C 5.8* 02/05/2015    Patient is on Vitamin D supplement.  Lab Results  Component Value Date   VD25OH 60 02/05/2015      Patient reports that he was recently in [place] for a visit with family and he was diagnosed with a skin infection and he reports that he did get some bleeding of the scrotum. He was given bactroban and also using some keflex and  cataflam.  He reports that he still has some rawness in his inner thighs and the base of the left side of the scrotum.    He reports that he did run out of metformin and he would like a refill in the 90 day instead of 30 day.      He reports that he has been seeing Dr. Gaynelle Arabian and the noturnal enuresis had stopped.  He is now having it again.   Current Medications:  Current Outpatient Prescriptions on File Prior to Visit  Medication Sig Dispense Refill  . alfuzosin (UROXATRAL) 10 MG 24 hr tablet Take 10 mg by mouth daily.      [name] atenolol (TENORMIN) 100 MG tablet TAKE 1 TABLET BY MOUTH EVERY DAY 90 tablet 3  . azelastine (ASTELIN) 0.1 % nasal spray Place 2 sprays into both nostrils 2 (two) times daily. 30 mL 12  . baclofen (LIORESAL) 10 MG tablet TAKE 1 TABLET BY MOUTH 3 TIMES A DAY AS NEEDED    . Black Cohosh 160 MG CAPS Take 160 mg by mouth daily.    . Calcium Carbonate-Vitamin D (CALCIUM + D PO) Take 500 mg by mouth 2 (two) times daily.     . cetirizine (ZYRTEC) 10 MG tablet Take 10 mg by mouth daily.    . Cinnamon 500 MG capsule Take 500 mg by mouth daily.    [name] co-enzyme Q-10 30 MG capsule Take 30 mg by mouth 3 (three) times daily.    [name]  cyclobenzaprine (FLEXERIL) 10 MG tablet 3 (three) times daily.     [name] desmopressin (DDAVP) 0.2 MG tablet Take 200 mcg by mouth at bedtime.  3  . Eszopiclone (ESZOPICLONE) 3 MG TABS Take 3 mg by mouth.    . famotidine (PEPCID) 20 MG tablet Take 1 tablet (20 mg total) by mouth 2 (two) times daily. 30 tablet 0  . Flaxseed, Linseed, (FLAX SEED OIL) 1000 MG CAPS Take 1,000 mg by mouth daily.    [name] gabapentin (NEURONTIN) 600 MG tablet 1,200 mg 3 (three) times daily.     . Garlic 123XX123 MG TABS Take 100 mg by mouth daily.    [name] glucosamine-chondroitin 500-400 MG tablet Take 1 tablet by mouth 3 (three) times daily.    . L-METHYLFOLATE CALCIUM PO Take by mouth.    . Lecithin 1200 MG CAPS Take by mouth.    . lidocaine (LIDODERM) 5 % Place 1 patch onto the skin  daily. Remove & Discard patch within 12 hours or as directed by MD    . memantine (NAMENDA) 10 MG tablet Take 20 mg by mouth daily.     . metFORMIN (GLUCOPHAGE XR) 500 MG 24 hr tablet Take one tablet twice daily with breakfast and dinner. 60 tablet 11  . mometasone-formoterol (DULERA) 200-5 MCG/ACT AERO Inhale 2 puffs into the lungs 2 (two) times daily. 13 g 3  . montelukast (SINGULAIR) 10 MG tablet TAKE 1 TABLET BY MOUTH EVERY DAY 90 tablet 3  . Multiple Vitamins-Minerals (MULTIVITAMIN WITH MINERALS) tablet Take 1 tablet by mouth daily.    . multivitamin-lutein (OCUVITE-LUTEIN) CAPS capsule Take 1 capsule by mouth daily.    [name] NEXIUM 20 MG capsule TAKE 1 CAPSULE (20 MG TOTAL) BY MOUTH DAILY AT 12 NOON. 90 capsule 1  . oxybutynin (DITROPAN) 5 MG tablet 5 mg 2 (two) times daily.   4  . pentosan polysulfate (ELMIRON) 100 MG capsule Take 200 mg by mouth 3 (three) times daily before meals.     . pravastatin (PRAVACHOL) 40 MG tablet TAKE 1 TABLET BY MOUTH AT BEDTIME 90 tablet 3  . PROAIR HFA 108 (90 BASE) MCG/ACT inhaler INHALE 2 PUFFS INTO THE LUNGS EVERY 4 (FOUR) HOURS AS NEEDED FOR WHEEZING OR SHORTNESS OF BREATH. 8.5 each 3  . ramelteon (ROZEREM) 8 MG tablet Take 8 mg by mouth.    . sertraline (ZOLOFT) 50 MG tablet Take 50 mg by mouth 2 (two) times daily.     . tadalafil (CIALIS) 5 MG tablet Take 5 mg by mouth.    . Testosterone (ANDROGEL PUMP) 20.25 MG/ACT (1.62%) GEL 3 pumps on each arm daily 225 g 2  . zinc gluconate 50 MG tablet Take 50 mg by mouth daily.    [name] zolpidem (AMBIEN) 10 MG tablet Take 10 mg by mouth.     No current facility-administered medications on file prior to visit.    Medical History:  Past Medical History  Diagnosis Date  . Dementia   . Hypogonadism male   . IBS (irritable bowel syndrome)   . Hyperlipidemia   . BPH (benign prostatic hyperplasia)   . Prostatitis   . OSA (obstructive sleep apnea)   . Hypertension   . Asthma   . Anesthesia complication requiring  reversal agent administration     ? from central apnea, very difficult to get off vent    Allergies:  Allergies  Allergen Reactions  . Cymbalta [Duloxetine Hcl]   . Fenofibrate     Back pain  .  Ppd [Tuberculin Purified Protein Derivative]     +ppd NEG Quantferron Gold 3/13  . Verapamil      Review of Systems:  Review of Systems  Constitutional: Negative for fever, chills and diaphoresis.  HENT: Negative for congestion, ear pain and sore throat.   Eyes: Negative.   Respiratory: Negative for cough, shortness of breath and wheezing.   Cardiovascular: Negative for chest pain, palpitations and leg swelling.  Gastrointestinal: Negative for heartburn, diarrhea, constipation, blood in stool and melena.  Genitourinary: Negative.   Skin: Negative.   Neurological: Negative for dizziness, seizures, loss of consciousness and headaches.  Psychiatric/Behavioral: Negative for depression. The patient is not nervous/anxious and does not have insomnia.     Family history- Review and unchanged  Social history- Review and unchanged  Physical Exam: BP 124/78 mmHg  Pulse 88  Temp(Src) 98.4 F (36.9 C) (Temporal)  Resp 18  Ht 6' 0.5" (1.842 m)  Wt 340 lb (154.223 kg)  BMI 45.45 kg/m2 Wt Readings from Last 3 Encounters:  05/10/15 340 lb (154.223 kg)  03/06/15 355 lb (161.027 kg)  02/05/15 358 lb (162.388 kg)    General Appearance: Well nourished well developed, in no apparent distress. Eyes: PERRLA, EOMs, conjunctiva no swelling or erythema ENT/Mouth: Ear canals normal without obstruction, swelling, erythma, discharge.  TMs normal bilaterally.  Oropharynx moist, clear, without exudate, or postoropharyngeal swelling. Neck: Supple, thyroid normal,no cervical adenopathy  Respiratory: Respiratory effort normal, Breath sounds clear A&P without rhonchi, wheeze, or rale.  No retractions, no accessory usage. Cardio: RRR with no MRGs. Brisk peripheral pulses without edema.  Abdomen: Soft, + BS,   Non tender, no guarding, rebound, hernias, masses. Musculoskeletal: Full ROM, 5/5 strength, Normal gait Skin: Warm, dry without rashes, lesions, ecchymosis.  Mild irritation and redness of the scrotum  Neuro: Awake and oriented X 3, Cranial nerves intact. Normal muscle tone, no cerebellar symptoms. Psych: Normal affect, Insight and Judgment appropriate.    Starlyn Skeans, PA-C 2:55 PM [facility]

## 2015-05-11 LAB — CBC WITH DIFFERENTIAL/PLATELET
BASOS ABS: 0 10*3/uL (ref 0.0–0.1)
Basophils Relative: 0 % (ref 0–1)
EOS PCT: 1 % (ref 0–5)
Eosinophils Absolute: 0.1 10*3/uL (ref 0.0–0.7)
HEMATOCRIT: 45.8 % (ref 39.0–52.0)
HEMOGLOBIN: 15.6 g/dL (ref 13.0–17.0)
LYMPHS ABS: 3.8 10*3/uL (ref 0.7–4.0)
LYMPHS PCT: 29 % (ref 12–46)
MCH: 30.1 pg (ref 26.0–34.0)
MCHC: 34.1 g/dL (ref 30.0–36.0)
MCV: 88.4 fL (ref 78.0–100.0)
MONOS PCT: 12 % (ref 3–12)
MPV: 10 fL (ref 8.6–12.4)
Monocytes Absolute: 1.6 10*3/uL — ABNORMAL HIGH (ref 0.1–1.0)
Neutro Abs: 7.6 10*3/uL (ref 1.7–7.7)
Neutrophils Relative %: 58 % (ref 43–77)
Platelets: 313 10*3/uL (ref 150–400)
RBC: 5.18 MIL/uL (ref 4.22–5.81)
RDW: 14.2 % (ref 11.5–15.5)
WBC: 13.1 10*3/uL — ABNORMAL HIGH (ref 4.0–10.5)

## 2015-05-11 LAB — BASIC METABOLIC PANEL WITH GFR
BUN: 18 mg/dL (ref 7–25)
CO2: 31 mmol/L (ref 20–31)
Calcium: 9.4 mg/dL (ref 8.6–10.3)
Chloride: 99 mmol/L (ref 98–110)
Creat: 0.72 mg/dL (ref 0.70–1.33)
GLUCOSE: 81 mg/dL (ref 65–99)
POTASSIUM: 4.4 mmol/L (ref 3.5–5.3)
Sodium: 139 mmol/L (ref 135–146)

## 2015-05-11 LAB — HEPATIC FUNCTION PANEL
ALBUMIN: 4 g/dL (ref 3.6–5.1)
ALK PHOS: 65 U/L (ref 40–115)
ALT: 16 U/L (ref 9–46)
AST: 17 U/L (ref 10–35)
BILIRUBIN INDIRECT: 1.3 mg/dL — AB (ref 0.2–1.2)
Bilirubin, Direct: 0.2 mg/dL (ref <=0.2)
Total Bilirubin: 1.5 mg/dL — ABNORMAL HIGH (ref 0.2–1.2)
Total Protein: 6.4 g/dL (ref 6.1–8.1)

## 2015-05-11 LAB — LIPID PANEL
Cholesterol: 133 mg/dL (ref 125–200)
HDL: 23 mg/dL — AB (ref >=40)
LDL CALC: 46 mg/dL (ref <130)
Total CHOL/HDL Ratio: 5.8 Ratio — ABNORMAL HIGH (ref <=5.0)
Triglycerides: 318 mg/dL — ABNORMAL HIGH (ref <150)
VLDL: 64 mg/dL — ABNORMAL HIGH (ref <30)

## 2015-05-11 LAB — HEMOGLOBIN A1C
HEMOGLOBIN A1C: 5.5 % (ref <5.7)
MEAN PLASMA GLUCOSE: 111 mg/dL (ref <117)

## 2015-05-11 LAB — TSH: TSH: 1.021 u[IU]/mL (ref 0.350–4.500)

## 2015-07-17 ENCOUNTER — Other Ambulatory Visit: Payer: Self-pay | Admitting: Internal Medicine

## 2015-08-13 ENCOUNTER — Other Ambulatory Visit: Payer: Self-pay | Admitting: *Deleted

## 2015-08-13 ENCOUNTER — Encounter: Payer: Self-pay | Admitting: Internal Medicine

## 2015-08-13 ENCOUNTER — Ambulatory Visit (INDEPENDENT_AMBULATORY_CARE_PROVIDER_SITE_OTHER): Payer: Self-pay | Admitting: Internal Medicine

## 2015-08-13 VITALS — BP 142/78 | HR 72 | Temp 97.6°F | Resp 16 | Ht 72.5 in | Wt 352.2 lb

## 2015-08-13 DIAGNOSIS — I10 Essential (primary) hypertension: Secondary | ICD-10-CM

## 2015-08-13 DIAGNOSIS — Z0000 Encounter for general adult medical examination without abnormal findings: Secondary | ICD-10-CM | POA: Diagnosis not present

## 2015-08-13 DIAGNOSIS — Z125 Encounter for screening for malignant neoplasm of prostate: Secondary | ICD-10-CM | POA: Diagnosis not present

## 2015-08-13 DIAGNOSIS — E785 Hyperlipidemia, unspecified: Secondary | ICD-10-CM

## 2015-08-13 DIAGNOSIS — R7303 Prediabetes: Secondary | ICD-10-CM

## 2015-08-13 DIAGNOSIS — E559 Vitamin D deficiency, unspecified: Secondary | ICD-10-CM

## 2015-08-13 DIAGNOSIS — Z1212 Encounter for screening for malignant neoplasm of rectum: Secondary | ICD-10-CM

## 2015-08-13 DIAGNOSIS — K219 Gastro-esophageal reflux disease without esophagitis: Secondary | ICD-10-CM

## 2015-08-13 DIAGNOSIS — E291 Testicular hypofunction: Secondary | ICD-10-CM

## 2015-08-13 DIAGNOSIS — E66813 Obesity, class 3: Secondary | ICD-10-CM

## 2015-08-13 DIAGNOSIS — R5383 Other fatigue: Secondary | ICD-10-CM

## 2015-08-13 DIAGNOSIS — G4733 Obstructive sleep apnea (adult) (pediatric): Secondary | ICD-10-CM

## 2015-08-13 DIAGNOSIS — Z79899 Other long term (current) drug therapy: Secondary | ICD-10-CM | POA: Diagnosis not present

## 2015-08-13 DIAGNOSIS — Z0001 Encounter for general adult medical examination with abnormal findings: Secondary | ICD-10-CM

## 2015-08-13 DIAGNOSIS — N400 Enlarged prostate without lower urinary tract symptoms: Secondary | ICD-10-CM

## 2015-08-13 DIAGNOSIS — Z6841 Body Mass Index (BMI) 40.0 and over, adult: Secondary | ICD-10-CM

## 2015-08-13 LAB — CBC WITH DIFFERENTIAL/PLATELET
BASOS ABS: 0.1 10*3/uL (ref 0.0–0.1)
BASOS PCT: 1 % (ref 0–1)
EOS ABS: 0.2 10*3/uL (ref 0.0–0.7)
Eosinophils Relative: 2 % (ref 0–5)
HCT: 49.8 % (ref 39.0–52.0)
Hemoglobin: 16.2 g/dL (ref 13.0–17.0)
Lymphocytes Relative: 38 % (ref 12–46)
Lymphs Abs: 3.1 10*3/uL (ref 0.7–4.0)
MCH: 29 pg (ref 26.0–34.0)
MCHC: 32.5 g/dL (ref 30.0–36.0)
MCV: 89.1 fL (ref 78.0–100.0)
MPV: 10.1 fL (ref 8.6–12.4)
Monocytes Absolute: 0.8 10*3/uL (ref 0.1–1.0)
Monocytes Relative: 10 % (ref 3–12)
NEUTROS PCT: 49 % (ref 43–77)
Neutro Abs: 4 10*3/uL (ref 1.7–7.7)
PLATELETS: 270 10*3/uL (ref 150–400)
RBC: 5.59 MIL/uL (ref 4.22–5.81)
RDW: 14 % (ref 11.5–15.5)
WBC: 8.2 10*3/uL (ref 4.0–10.5)

## 2015-08-13 LAB — VITAMIN B12: Vitamin B-12: 1857 pg/mL — ABNORMAL HIGH (ref 200–1100)

## 2015-08-13 LAB — TESTOSTERONE: TESTOSTERONE: 656 ng/dL (ref 250–827)

## 2015-08-13 LAB — TSH: TSH: 1.26 m[IU]/L (ref 0.40–4.50)

## 2015-08-13 MED ORDER — MONTELUKAST SODIUM 10 MG PO TABS: 10.0000 mg | ORAL_TABLET | Freq: Every day | ORAL | Status: DC

## 2015-08-13 MED ORDER — ALBUTEROL SULFATE HFA 108 (90 BASE) MCG/ACT IN AERS: INHALATION_SPRAY | RESPIRATORY_TRACT | Status: DC

## 2015-08-13 NOTE — Patient Instructions (Signed)
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
## 2015-08-14 LAB — LIPID PANEL
CHOL/HDL RATIO: 5.3 ratio — AB (ref <=5.0)
CHOLESTEROL: 132 mg/dL (ref 125–200)
HDL: 25 mg/dL — ABNORMAL LOW (ref >=40)
LDL Cholesterol: 58 mg/dL (ref <130)
Triglycerides: 243 mg/dL — ABNORMAL HIGH (ref <150)
VLDL: 49 mg/dL — ABNORMAL HIGH (ref <30)

## 2015-08-14 LAB — URINALYSIS, ROUTINE W REFLEX MICROSCOPIC
BILIRUBIN URINE: NEGATIVE
GLUCOSE, UA: NEGATIVE
HGB URINE DIPSTICK: NEGATIVE
KETONES UR: NEGATIVE
LEUKOCYTES UA: NEGATIVE
Nitrite: NEGATIVE
PH: 5 (ref 5.0–8.0)
PROTEIN: NEGATIVE
Specific Gravity, Urine: 1.032 (ref 1.001–1.035)

## 2015-08-14 LAB — IRON AND TIBC
%SAT: 25 % (ref 15–60)
Iron: 92 ug/dL (ref 50–180)
TIBC: 364 ug/dL (ref 250–425)
UIBC: 272 ug/dL (ref 125–400)

## 2015-08-14 LAB — HEPATIC FUNCTION PANEL
ALBUMIN: 4 g/dL (ref 3.6–5.1)
ALT: 25 U/L (ref 9–46)
AST: 19 U/L (ref 10–35)
Alkaline Phosphatase: 59 U/L (ref 40–115)
BILIRUBIN DIRECT: 0.3 mg/dL — AB (ref <=0.2)
BILIRUBIN TOTAL: 1.5 mg/dL — AB (ref 0.2–1.2)
Indirect Bilirubin: 1.2 mg/dL (ref 0.2–1.2)
TOTAL PROTEIN: 6.5 g/dL (ref 6.1–8.1)

## 2015-08-14 LAB — MICROALBUMIN / CREATININE URINE RATIO
Creatinine, Urine: 412 mg/dL — ABNORMAL HIGH (ref 20–370)
MICROALB UR: 1.1 mg/dL
MICROALB/CREAT RATIO: 3 ug/mg{creat} (ref <30)

## 2015-08-14 LAB — HEMOGLOBIN A1C
HEMOGLOBIN A1C: 5.7 % — AB (ref <5.7)
MEAN PLASMA GLUCOSE: 117 mg/dL — AB (ref <117)

## 2015-08-14 LAB — PSA: PSA: 0.66 ng/mL (ref <=4.00)

## 2015-08-14 LAB — BASIC METABOLIC PANEL WITH GFR
BUN: 13 mg/dL (ref 7–25)
CHLORIDE: 102 mmol/L (ref 98–110)
CO2: 26 mmol/L (ref 20–31)
CREATININE: 0.85 mg/dL (ref 0.70–1.33)
Calcium: 9.4 mg/dL (ref 8.6–10.3)
GFR, Est African American: >89 mL/min (ref >=60)
Glucose, Bld: 108 mg/dL — ABNORMAL HIGH (ref 65–99)
POTASSIUM: 4.2 mmol/L (ref 3.5–5.3)
SODIUM: 141 mmol/L (ref 135–146)

## 2015-08-14 LAB — INSULIN, RANDOM: INSULIN: 35 u[IU]/mL — AB (ref 2.0–19.6)

## 2015-08-14 LAB — MAGNESIUM: MAGNESIUM: 1.8 mg/dL (ref 1.5–2.5)

## 2015-08-14 LAB — VITAMIN D 25 HYDROXY (VIT D DEFICIENCY, FRACTURES): VIT D 25 HYDROXY: 108 ng/mL — AB (ref 30–100)

## 2015-09-20 DIAGNOSIS — G43709 Chronic migraine without aura, not intractable, without status migrainosus: Secondary | ICD-10-CM | POA: Diagnosis not present

## 2015-09-26 ENCOUNTER — Other Ambulatory Visit: Payer: Self-pay | Admitting: Internal Medicine

## 2015-11-15 DIAGNOSIS — R413 Other amnesia: Secondary | ICD-10-CM | POA: Diagnosis not present

## 2015-11-15 DIAGNOSIS — M5416 Radiculopathy, lumbar region: Secondary | ICD-10-CM | POA: Diagnosis not present

## 2015-11-15 DIAGNOSIS — Z6841 Body Mass Index (BMI) 40.0 and over, adult: Secondary | ICD-10-CM | POA: Diagnosis not present

## 2015-11-15 DIAGNOSIS — M4726 Other spondylosis with radiculopathy, lumbar region: Secondary | ICD-10-CM | POA: Diagnosis not present

## 2015-12-11 DIAGNOSIS — M545 Low back pain: Secondary | ICD-10-CM | POA: Diagnosis not present

## 2015-12-11 DIAGNOSIS — M2550 Pain in unspecified joint: Secondary | ICD-10-CM | POA: Diagnosis not present

## 2015-12-11 DIAGNOSIS — M12841 Other specific arthropathies, not elsewhere classified, right hand: Secondary | ICD-10-CM | POA: Diagnosis not present

## 2015-12-11 DIAGNOSIS — M12842 Other specific arthropathies, not elsewhere classified, left hand: Secondary | ICD-10-CM | POA: Diagnosis not present

## 2015-12-17 DIAGNOSIS — M4726 Other spondylosis with radiculopathy, lumbar region: Secondary | ICD-10-CM | POA: Diagnosis not present

## 2015-12-19 DIAGNOSIS — M9905 Segmental and somatic dysfunction of pelvic region: Secondary | ICD-10-CM | POA: Diagnosis not present

## 2015-12-19 DIAGNOSIS — M9901 Segmental and somatic dysfunction of cervical region: Secondary | ICD-10-CM | POA: Diagnosis not present

## 2015-12-19 DIAGNOSIS — M9903 Segmental and somatic dysfunction of lumbar region: Secondary | ICD-10-CM | POA: Diagnosis not present

## 2015-12-20 DIAGNOSIS — G43709 Chronic migraine without aura, not intractable, without status migrainosus: Secondary | ICD-10-CM | POA: Diagnosis not present

## 2015-12-24 DIAGNOSIS — M9901 Segmental and somatic dysfunction of cervical region: Secondary | ICD-10-CM | POA: Diagnosis not present

## 2015-12-24 DIAGNOSIS — M9903 Segmental and somatic dysfunction of lumbar region: Secondary | ICD-10-CM | POA: Diagnosis not present

## 2015-12-25 DIAGNOSIS — M25561 Pain in right knee: Secondary | ICD-10-CM | POA: Diagnosis not present

## 2015-12-25 DIAGNOSIS — M25562 Pain in left knee: Secondary | ICD-10-CM | POA: Diagnosis not present

## 2015-12-25 DIAGNOSIS — M25569 Pain in unspecified knee: Secondary | ICD-10-CM | POA: Diagnosis not present

## 2015-12-25 DIAGNOSIS — M2550 Pain in unspecified joint: Secondary | ICD-10-CM | POA: Diagnosis not present

## 2015-12-27 DIAGNOSIS — M9903 Segmental and somatic dysfunction of lumbar region: Secondary | ICD-10-CM | POA: Diagnosis not present

## 2015-12-27 DIAGNOSIS — M25569 Pain in unspecified knee: Secondary | ICD-10-CM | POA: Diagnosis not present

## 2015-12-27 DIAGNOSIS — M9902 Segmental and somatic dysfunction of thoracic region: Secondary | ICD-10-CM | POA: Diagnosis not present

## 2015-12-27 DIAGNOSIS — M9905 Segmental and somatic dysfunction of pelvic region: Secondary | ICD-10-CM | POA: Diagnosis not present

## 2015-12-27 DIAGNOSIS — M9901 Segmental and somatic dysfunction of cervical region: Secondary | ICD-10-CM | POA: Diagnosis not present

## 2016-01-01 DIAGNOSIS — M9901 Segmental and somatic dysfunction of cervical region: Secondary | ICD-10-CM | POA: Diagnosis not present

## 2016-01-01 DIAGNOSIS — M9902 Segmental and somatic dysfunction of thoracic region: Secondary | ICD-10-CM | POA: Diagnosis not present

## 2016-01-01 DIAGNOSIS — M9905 Segmental and somatic dysfunction of pelvic region: Secondary | ICD-10-CM | POA: Diagnosis not present

## 2016-01-01 DIAGNOSIS — M9903 Segmental and somatic dysfunction of lumbar region: Secondary | ICD-10-CM | POA: Diagnosis not present

## 2016-01-07 DIAGNOSIS — M9902 Segmental and somatic dysfunction of thoracic region: Secondary | ICD-10-CM | POA: Diagnosis not present

## 2016-01-07 DIAGNOSIS — M9901 Segmental and somatic dysfunction of cervical region: Secondary | ICD-10-CM | POA: Diagnosis not present

## 2016-01-07 DIAGNOSIS — M9903 Segmental and somatic dysfunction of lumbar region: Secondary | ICD-10-CM | POA: Diagnosis not present

## 2016-01-07 DIAGNOSIS — M9905 Segmental and somatic dysfunction of pelvic region: Secondary | ICD-10-CM | POA: Diagnosis not present

## 2016-01-15 ENCOUNTER — Ambulatory Visit (INDEPENDENT_AMBULATORY_CARE_PROVIDER_SITE_OTHER): Payer: BLUE CROSS/BLUE SHIELD | Admitting: Internal Medicine

## 2016-01-15 ENCOUNTER — Encounter: Payer: Self-pay | Admitting: Internal Medicine

## 2016-01-15 ENCOUNTER — Emergency Department (HOSPITAL_COMMUNITY): Payer: BLUE CROSS/BLUE SHIELD

## 2016-01-15 ENCOUNTER — Encounter (HOSPITAL_COMMUNITY): Payer: Self-pay

## 2016-01-15 ENCOUNTER — Inpatient Hospital Stay (HOSPITAL_COMMUNITY)
Admission: EM | Admit: 2016-01-15 | Discharge: 2016-01-19 | DRG: 872 | Disposition: A | Discharge status: Home / Self Care | Payer: BLUE CROSS/BLUE SHIELD | Attending: Internal Medicine | Admitting: Internal Medicine

## 2016-01-15 VITALS — BP 90/60 | HR 96 | Temp 100.4°F | Resp 16 | Ht 72.5 in | Wt 363.4 lb

## 2016-01-15 DIAGNOSIS — J449 Chronic obstructive pulmonary disease, unspecified: Secondary | ICD-10-CM | POA: Diagnosis present

## 2016-01-15 DIAGNOSIS — T8089XA Other complications following infusion, transfusion and therapeutic injection, initial encounter: Secondary | ICD-10-CM | POA: Diagnosis not present

## 2016-01-15 DIAGNOSIS — R05 Cough: Secondary | ICD-10-CM | POA: Diagnosis not present

## 2016-01-15 DIAGNOSIS — R7302 Impaired glucose tolerance (oral): Secondary | ICD-10-CM | POA: Diagnosis present

## 2016-01-15 DIAGNOSIS — K219 Gastro-esophageal reflux disease without esophagitis: Secondary | ICD-10-CM | POA: Diagnosis not present

## 2016-01-15 DIAGNOSIS — Z6841 Body Mass Index (BMI) 40.0 and over, adult: Secondary | ICD-10-CM

## 2016-01-15 DIAGNOSIS — F0390 Unspecified dementia without behavioral disturbance: Secondary | ICD-10-CM | POA: Diagnosis present

## 2016-01-15 DIAGNOSIS — Z888 Allergy status to other drugs, medicaments and biological substances status: Secondary | ICD-10-CM | POA: Diagnosis not present

## 2016-01-15 DIAGNOSIS — F329 Major depressive disorder, single episode, unspecified: Secondary | ICD-10-CM | POA: Diagnosis present

## 2016-01-15 DIAGNOSIS — Y848 Other medical procedures as the cause of abnormal reaction of the patient, or of later complication, without mention of misadventure at the time of the procedure: Secondary | ICD-10-CM | POA: Diagnosis not present

## 2016-01-15 DIAGNOSIS — Z887 Allergy status to serum and vaccine status: Secondary | ICD-10-CM | POA: Diagnosis not present

## 2016-01-15 DIAGNOSIS — Z87891 Personal history of nicotine dependence: Secondary | ICD-10-CM

## 2016-01-15 DIAGNOSIS — N390 Urinary tract infection, site not specified: Secondary | ICD-10-CM | POA: Diagnosis present

## 2016-01-15 DIAGNOSIS — N400 Enlarged prostate without lower urinary tract symptoms: Secondary | ICD-10-CM | POA: Diagnosis present

## 2016-01-15 DIAGNOSIS — J45909 Unspecified asthma, uncomplicated: Secondary | ICD-10-CM | POA: Diagnosis not present

## 2016-01-15 DIAGNOSIS — A419 Sepsis, unspecified organism: Secondary | ICD-10-CM | POA: Diagnosis not present

## 2016-01-15 DIAGNOSIS — J189 Pneumonia, unspecified organism: Secondary | ICD-10-CM

## 2016-01-15 DIAGNOSIS — I10 Essential (primary) hypertension: Secondary | ICD-10-CM | POA: Diagnosis present

## 2016-01-15 DIAGNOSIS — K589 Irritable bowel syndrome without diarrhea: Secondary | ICD-10-CM | POA: Diagnosis not present

## 2016-01-15 DIAGNOSIS — Z91030 Bee allergy status: Secondary | ICD-10-CM | POA: Diagnosis not present

## 2016-01-15 DIAGNOSIS — G4733 Obstructive sleep apnea (adult) (pediatric): Secondary | ICD-10-CM | POA: Diagnosis present

## 2016-01-15 DIAGNOSIS — Z79899 Other long term (current) drug therapy: Secondary | ICD-10-CM | POA: Diagnosis not present

## 2016-01-15 DIAGNOSIS — Z8249 Family history of ischemic heart disease and other diseases of the circulatory system: Secondary | ICD-10-CM | POA: Diagnosis not present

## 2016-01-15 DIAGNOSIS — E782 Mixed hyperlipidemia: Secondary | ICD-10-CM | POA: Diagnosis present

## 2016-01-15 DIAGNOSIS — E785 Hyperlipidemia, unspecified: Secondary | ICD-10-CM | POA: Diagnosis present

## 2016-01-15 DIAGNOSIS — F3341 Major depressive disorder, recurrent, in partial remission: Secondary | ICD-10-CM | POA: Diagnosis present

## 2016-01-15 LAB — COMPREHENSIVE METABOLIC PANEL
ALK PHOS: 57 U/L (ref 38–126)
ALT: 24 U/L (ref 17–63)
AST: 23 U/L (ref 15–41)
Albumin: 3.8 g/dL (ref 3.5–5.0)
Anion gap: 9 (ref 5–15)
BILIRUBIN TOTAL: 2.1 mg/dL — AB (ref 0.3–1.2)
BUN: 16 mg/dL (ref 6–20)
CALCIUM: 9.6 mg/dL (ref 8.9–10.3)
CO2: 28 mmol/L (ref 22–32)
CREATININE: 2.21 mg/dL — AB (ref 0.61–1.24)
Chloride: 100 mmol/L — ABNORMAL LOW (ref 101–111)
GFR calc Af Amer: 37 mL/min — ABNORMAL LOW (ref >60)
GFR, EST NON AFRICAN AMERICAN: 32 mL/min — AB (ref >60)
Glucose, Bld: 115 mg/dL — ABNORMAL HIGH (ref 65–99)
Potassium: 4.4 mmol/L (ref 3.5–5.1)
Sodium: 137 mmol/L (ref 135–145)
Total Protein: 6.8 g/dL (ref 6.5–8.1)

## 2016-01-15 LAB — URINALYSIS, ROUTINE W REFLEX MICROSCOPIC
GLUCOSE, UA: NEGATIVE mg/dL
KETONES UR: 15 mg/dL — AB
Nitrite: NEGATIVE
PH: 5 (ref 5.0–8.0)
Protein, ur: 100 mg/dL — AB
Specific Gravity, Urine: 1.033 — ABNORMAL HIGH (ref 1.005–1.030)

## 2016-01-15 LAB — CBC WITH DIFFERENTIAL/PLATELET
BASOS PCT: 0 %
Basophils Absolute: 0.1 10*3/uL (ref 0.0–0.1)
EOS PCT: 0 %
Eosinophils Absolute: 0 10*3/uL (ref 0.0–0.7)
HEMATOCRIT: 49.6 % (ref 39.0–52.0)
Hemoglobin: 16 g/dL (ref 13.0–17.0)
LYMPHS PCT: 9 %
Lymphs Abs: 1.8 10*3/uL (ref 0.7–4.0)
MCH: 29.7 pg (ref 26.0–34.0)
MCHC: 32.3 g/dL (ref 30.0–36.0)
MCV: 92.2 fL (ref 78.0–100.0)
MONO ABS: 1.6 10*3/uL — AB (ref 0.1–1.0)
MONOS PCT: 8 %
NEUTROS ABS: 16.4 10*3/uL — AB (ref 1.7–7.7)
Neutrophils Relative %: 83 %
Platelets: 264 10*3/uL (ref 150–400)
RBC: 5.38 MIL/uL (ref 4.22–5.81)
RDW: 14.7 % (ref 11.5–15.5)
WBC: 19.9 10*3/uL — ABNORMAL HIGH (ref 4.0–10.5)

## 2016-01-15 LAB — LACTIC ACID, PLASMA: LACTIC ACID, VENOUS: 2 mmol/L — AB (ref 0.5–1.9)

## 2016-01-15 LAB — I-STAT CG4 LACTIC ACID, ED
LACTIC ACID, VENOUS: 2.74 mmol/L — AB (ref 0.5–1.9)
Lactic Acid, Venous: 3.22 mmol/L (ref 0.5–1.9)
Lactic Acid, Venous: 1.19 mmol/L (ref 0.5–1.9)

## 2016-01-15 LAB — URINE MICROSCOPIC-ADD ON

## 2016-01-15 MED ORDER — MONTELUKAST SODIUM 10 MG PO TABS
10.0000 mg | ORAL_TABLET | Freq: Every day | ORAL | Status: DC
  Administered 2016-01-16 – 2016-01-19 (×4): 10 mg via ORAL
  Filled 2016-01-15 (×4): qty 1

## 2016-01-15 MED ORDER — PRAVASTATIN SODIUM 40 MG PO TABS
40.0000 mg | ORAL_TABLET | Freq: Every day | ORAL | Status: DC
  Administered 2016-01-15 – 2016-01-18 (×4): 40 mg via ORAL
  Filled 2016-01-15 (×4): qty 1

## 2016-01-15 MED ORDER — ONDANSETRON HCL 4 MG/2ML IJ SOLN: 4.0000 mg | Freq: Four times a day (QID) | INTRAMUSCULAR | Status: DC | PRN

## 2016-01-15 MED ORDER — TADALAFIL 5 MG PO TABS
5.0000 mg | ORAL_TABLET | Freq: Every day | ORAL | Status: DC
  Filled 2016-01-15: qty 1

## 2016-01-15 MED ORDER — ACETAMINOPHEN 325 MG PO TABS
325.0000 mg | ORAL_TABLET | Freq: Once | ORAL | Status: AC
  Administered 2016-01-15: 325 mg via ORAL

## 2016-01-15 MED ORDER — MOMETASONE FURO-FORMOTEROL FUM 200-5 MCG/ACT IN AERO
2.0000 | INHALATION_SPRAY | Freq: Two times a day (BID) | RESPIRATORY_TRACT | Status: DC
  Administered 2016-01-16 – 2016-01-19 (×7): 2 via RESPIRATORY_TRACT
  Filled 2016-01-15: qty 8.8

## 2016-01-15 MED ORDER — SODIUM CHLORIDE 0.9 % IV SOLN
INTRAVENOUS | Status: DC
  Administered 2016-01-15: 1000 mL via INTRAVENOUS
  Administered 2016-01-16 – 2016-01-17 (×5): via INTRAVENOUS

## 2016-01-15 MED ORDER — VANCOMYCIN HCL 10 G IV SOLR
2500.0000 mg | Freq: Once | INTRAVENOUS | Status: DC
  Filled 2016-01-15: qty 2500

## 2016-01-15 MED ORDER — GABAPENTIN 600 MG PO TABS
1200.0000 mg | ORAL_TABLET | Freq: Three times a day (TID) | ORAL | Status: DC
  Administered 2016-01-15 – 2016-01-19 (×11): 1200 mg via ORAL
  Filled 2016-01-15 (×12): qty 2

## 2016-01-15 MED ORDER — OXYCODONE-ACETAMINOPHEN 5-325 MG PO TABS
1.0000 | ORAL_TABLET | ORAL | Status: DC | PRN
Start: 2016-01-15 — End: 2016-01-19
  Administered 2016-01-16 – 2016-01-19 (×3): 1 via ORAL
  Filled 2016-01-15 (×3): qty 1

## 2016-01-15 MED ORDER — ACETAMINOPHEN 325 MG PO TABS
ORAL_TABLET | ORAL | Status: AC
  Filled 2016-01-15: qty 2

## 2016-01-15 MED ORDER — PANTOPRAZOLE SODIUM 40 MG PO TBEC
40.0000 mg | DELAYED_RELEASE_TABLET | Freq: Every day | ORAL | Status: DC
  Administered 2016-01-16 – 2016-01-19 (×4): 40 mg via ORAL
  Filled 2016-01-15 (×4): qty 1

## 2016-01-15 MED ORDER — CYCLOBENZAPRINE HCL 10 MG PO TABS
10.0000 mg | ORAL_TABLET | Freq: Three times a day (TID) | ORAL | Status: DC
  Administered 2016-01-15 – 2016-01-19 (×11): 10 mg via ORAL
  Filled 2016-01-15 (×11): qty 1

## 2016-01-15 MED ORDER — SODIUM CHLORIDE 0.9 % IV BOLUS (SEPSIS)
1000.0000 mL | Freq: Once | INTRAVENOUS | Status: AC
  Administered 2016-01-15: 1000 mL via INTRAVENOUS

## 2016-01-15 MED ORDER — DEXTROSE 5 % IV SOLN: 1.0000 g | Freq: Once | INTRAVENOUS | Status: DC

## 2016-01-15 MED ORDER — SERTRALINE HCL 50 MG PO TABS
50.0000 mg | ORAL_TABLET | Freq: Two times a day (BID) | ORAL | Status: DC
  Administered 2016-01-16 – 2016-01-19 (×8): 50 mg via ORAL
  Filled 2016-01-15 (×8): qty 1

## 2016-01-15 MED ORDER — DARIFENACIN HYDROBROMIDE ER 7.5 MG PO TB24
7.5000 mg | ORAL_TABLET | Freq: Every day | ORAL | Status: DC
  Administered 2016-01-15 – 2016-01-19 (×5): 7.5 mg via ORAL
  Filled 2016-01-15 (×5): qty 1

## 2016-01-15 MED ORDER — ALFUZOSIN HCL ER 10 MG PO TB24
10.0000 mg | ORAL_TABLET | Freq: Every day | ORAL | Status: DC
  Administered 2016-01-16 – 2016-01-19 (×4): 10 mg via ORAL
  Filled 2016-01-15 (×5): qty 1

## 2016-01-15 MED ORDER — DEXTROSE 5 % IV SOLN
2.0000 g | INTRAVENOUS | Status: DC
  Administered 2016-01-16 – 2016-01-18 (×4): 2 g via INTRAVENOUS
  Filled 2016-01-15 (×5): qty 2

## 2016-01-15 MED ORDER — AZELASTINE HCL 0.1 % NA SOLN
2.0000 | Freq: Two times a day (BID) | NASAL | Status: DC
  Administered 2016-01-16 – 2016-01-19 (×7): 2 via NASAL
  Filled 2016-01-15: qty 30

## 2016-01-15 MED ORDER — OXYCODONE HCL 5 MG PO TABS
5.0000 mg | ORAL_TABLET | ORAL | Status: DC | PRN
  Administered 2016-01-15 – 2016-01-19 (×5): 5 mg via ORAL
  Filled 2016-01-15 (×5): qty 1

## 2016-01-15 MED ORDER — AZITHROMYCIN 500 MG IV SOLR
500.0000 mg | Freq: Once | INTRAVENOUS | Status: AC
  Administered 2016-01-15: 500 mg via INTRAVENOUS
  Filled 2016-01-15: qty 500

## 2016-01-15 MED ORDER — METFORMIN HCL ER 500 MG PO TB24
500.0000 mg | ORAL_TABLET | Freq: Two times a day (BID) | ORAL | Status: DC
  Filled 2016-01-15: qty 1

## 2016-01-15 MED ORDER — VANCOMYCIN HCL IN DEXTROSE 1-5 GM/200ML-% IV SOLN: 1000.0000 mg | Freq: Once | INTRAVENOUS | Status: DC

## 2016-01-15 MED ORDER — PIPERACILLIN-TAZOBACTAM 3.375 G IVPB 30 MIN
3.3750 g | Freq: Once | INTRAVENOUS | Status: DC
  Administered 2016-01-15: 3.375 g via INTRAVENOUS
  Filled 2016-01-15: qty 50

## 2016-01-15 MED ORDER — DONEPEZIL HCL 23 MG PO TABS
23.0000 mg | ORAL_TABLET | Freq: Every day | ORAL | Status: DC
  Administered 2016-01-16 – 2016-01-18 (×4): 23 mg via ORAL
  Filled 2016-01-15 (×4): qty 1

## 2016-01-15 MED ORDER — DEXTROSE 5 % IV SOLN
500.0000 mg | INTRAVENOUS | Status: DC
  Administered 2016-01-16 – 2016-01-18 (×3): 500 mg via INTRAVENOUS
  Filled 2016-01-15 (×5): qty 500

## 2016-01-15 MED ORDER — PENTOSAN POLYSULFATE SODIUM 100 MG PO CAPS
200.0000 mg | ORAL_CAPSULE | Freq: Three times a day (TID) | ORAL | Status: DC
  Administered 2016-01-16 – 2016-01-19 (×11): 200 mg via ORAL
  Filled 2016-01-15 (×11): qty 2

## 2016-01-15 MED ORDER — SODIUM CHLORIDE 0.9 % IV BOLUS (SEPSIS)
1000.0000 mL | Freq: Once | INTRAVENOUS | Status: AC
  Administered 2016-01-15 (×2): 1000 mL via INTRAVENOUS

## 2016-01-15 MED ORDER — SODIUM CHLORIDE 0.9% FLUSH
3.0000 mL | Freq: Two times a day (BID) | INTRAVENOUS | Status: DC
  Administered 2016-01-17 – 2016-01-19 (×4): 3 mL via INTRAVENOUS

## 2016-01-15 MED ORDER — MEMANTINE HCL 10 MG PO TABS
20.0000 mg | ORAL_TABLET | Freq: Every day | ORAL | Status: DC
  Administered 2016-01-15 – 2016-01-18 (×4): 20 mg via ORAL
  Filled 2016-01-15 (×4): qty 2

## 2016-01-15 MED ORDER — ENOXAPARIN SODIUM 100 MG/ML ~~LOC~~ SOLN
0.5000 mg/kg | SUBCUTANEOUS | Status: DC
  Administered 2016-01-16 – 2016-01-18 (×4): 85 mg via SUBCUTANEOUS
  Filled 2016-01-15 (×4): qty 1

## 2016-01-15 MED ORDER — OXYCODONE-ACETAMINOPHEN 10-325 MG PO TABS: 1.0000 | ORAL_TABLET | ORAL | Status: DC | PRN

## 2016-01-15 MED ORDER — ONDANSETRON HCL 4 MG PO TABS: 4.0000 mg | ORAL_TABLET | Freq: Four times a day (QID) | ORAL | Status: DC | PRN

## 2016-01-15 MED ORDER — ANASTROZOLE 1 MG PO TABS
1.0000 mg | ORAL_TABLET | Freq: Every day | ORAL | Status: DC
  Administered 2016-01-16 – 2016-01-19 (×4): 1 mg via ORAL
  Filled 2016-01-15 (×4): qty 1

## 2016-01-15 MED ORDER — DESMOPRESSIN ACETATE 0.2 MG PO TABS
200.0000 ug | ORAL_TABLET | Freq: Every day | ORAL | Status: DC
  Administered 2016-01-15 – 2016-01-18 (×4): 200 ug via ORAL
  Filled 2016-01-15 (×4): qty 1

## 2016-01-15 MED ORDER — SODIUM CHLORIDE 0.9 % IV BOLUS (SEPSIS): 1000.0000 mL | Freq: Once | INTRAVENOUS | Status: DC

## 2016-01-15 NOTE — ED Notes (Signed)
2nd set of blood culture drawn when starting IV.

## 2016-01-15 NOTE — ED Provider Notes (Signed)
[facility] DEPT Provider Note   CSN: [id number] Arrival date & time: 01/15/16  1304     History   Chief Complaint Chief Complaint  Patient presents with  . Fever  . Cough    HPI Mark Harrell is a 55 y.o. male.  HPI  Patient with HLD, HTN, Asthma, morbid obesity presents for fever and cough.  The cough has been going on a week and is productive of green phlegm.  Last night, he had fever to 102 with chills.  No SOB, Cp, abdominal pain, n/v, dysuria.  He does not decreased urination x one day.  Past Medical History:  Diagnosis Date  . Anesthesia complication requiring reversal agent administration    ? from central apnea, very difficult to get off vent  . Asthma   . BPH (benign prostatic hyperplasia)   . Dementia   . Hyperlipidemia   . Hypertension   . Hypogonadism male   . IBS (irritable bowel syndrome)   . OSA (obstructive sleep apnea)   . Prostatitis     Patient Active Problem List   Diagnosis Date Noted  . BMI 47.46,   adult ([place]) 03/06/2015  . SDAT 02/05/2015  . Asthma 02/05/2015  . OSA (obstructive sleep apnea) 02/05/2015  . Medication management 08/02/2014  . GERD (gastroesophageal reflux disease) 05/09/2014  . Obesity, Class III, BMI 40-49.9 (morbid obesity) ([place]) 02/01/2014  . Vitamin D deficiency 08/01/2013  . Prediabetes 08/01/2013  . 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 Diverticula of colon 05/07/2011  . Hypertension 01/31/2011  . Hyperlipidemia 01/31/2011  . Migraine 01/31/2011  . BPH (benign prostatic hyperplasia) 01/31/2011  . Prostatitis 01/31/2011  . Testosterone Deficiency 01/31/2011  . IBS (irritable bowel syndrome) 01/31/2011  . Partial complex seizure disorder with intractable epilepsy ([place]) 01/31/2011  . Depression, controlled 01/31/2011  . Mild chronic obstructive pulmonary disease ([place]) 01/31/2011    Past Surgical History:  Procedure Laterality Date  . CYSTOSCOPY     [place]       Home Medications    Prior to Admission  medications   Medication Sig Start Date End Date Taking? Authorizing Provider  albuterol (PROAIR HFA) 108 (90 Base) MCG/ACT inhaler INHALE 2 PUFFS INTO THE LUNGS EVERY 4 (FOUR) HOURS AS NEEDED FOR WHEEZING OR SHORTNESS OF BREATH. 08/13/15   [name], MD  alfuzosin (UROXATRAL) 10 MG 24 hr tablet Take 10 mg by mouth daily.      Historical Provider, MD  anastrozole (ARIMIDEX) 1 MG tablet Take 1 mg by mouth daily. 08/08/15   Historical Provider, MD  atenolol (TENORMIN) 100 MG tablet TAKE 1 TABLET BY MOUTH EVERY DAY 09/26/15   [name], MD  azelastine (ASTELIN) 0.1 % nasal spray Place 2 sprays into both nostrils 2 (two) times daily. 08/29/14   Courtney Forcucci, PA-C  baclofen (LIORESAL) 10 MG tablet TAKE 1 TABLET BY MOUTH 3 TIMES A DAY AS NEEDED 03/22/14   Historical Provider, MD  Black Cohosh 160 MG CAPS Take 160 mg by mouth daily.    Historical Provider, MD  Calcium Carbonate-Vitamin D (CALCIUM + D PO) Take 500 mg by mouth 2 (two) times daily.     Historical Provider, MD  cetirizine (ZYRTEC) 10 MG tablet Take 10 mg by mouth daily.    Historical Provider, MD  Cholecalciferol (VITAMIN D PO) Take 5,000 Units by mouth 4 (four) times daily.    Historical Provider, MD  Cinnamon 500 MG capsule Take 500 mg by mouth daily.    Historical Provider, MD  co-enzyme Q-10 30 MG capsule Take 30 mg by mouth 3 (three) times daily.    Historical Provider, MD  cyclobenzaprine (FLEXERIL) 10 MG tablet 3 (three) times daily.  07/28/13   Historical Provider, MD  desmopressin (DDAVP) 0.2 MG tablet Take 200 mcg by mouth at bedtime. 04/10/14   Historical Provider, MD  donepezil (ARICEPT) 23 MG TABS tablet Take 23 mg by mouth at bedtime.    Historical Provider, MD  Eszopiclone (ESZOPICLONE) 3 MG TABS Take 3 mg by mouth.    Historical Provider, MD  famotidine (PEPCID) 20 MG tablet Take 1 tablet (20 mg total) by mouth 2 (two) times daily. 08/21/14 08/21/15  Courtney Forcucci, PA-C  Flaxseed, Linseed, (FLAX SEED OIL) 1000 MG  CAPS Take 1,000 mg by mouth daily.    Historical Provider, MD  gabapentin (NEURONTIN) 600 MG tablet 1,200 mg 3 (three) times daily.  07/28/13   Historical Provider, MD  Garlic 123XX123 MG TABS Take 100 mg by mouth daily.    Historical Provider, MD  glucosamine-chondroitin 500-400 MG tablet Take 1 tablet by mouth 3 (three) times daily.    Historical Provider, MD  L-METHYLFOLATE CALCIUM PO Take by mouth. 11/24/13   Historical Provider, MD  Lecithin 1200 MG CAPS Take by mouth.    Historical Provider, MD  lidocaine (LIDODERM) 5 % Place 1 patch onto the skin daily. Remove & Discard patch within 12 hours or as directed by MD    Historical Provider, MD  memantine (NAMENDA) 10 MG tablet Take 20 mg by mouth daily.     Historical Provider, MD  metFORMIN (GLUCOPHAGE XR) 500 MG 24 hr tablet Take one tablet twice daily with breakfast and dinner. 05/10/15 05/09/16  Starlyn Skeans, PA-C  Methylfol-Algae-B12-Acetylcyst (CEREFOLIN NAC) 6-90.314-2-600 MG TABS  08/03/12   Historical Provider, MD  mirabegron ER (MYRBETRIQ) 50 MG TB24 tablet  12/26/12   Historical Provider, MD  mometasone-formoterol (DULERA) 200-5 MCG/ACT AERO Inhale 2 puffs into the lungs 2 (two) times daily. 11/02/14   Courtney Forcucci, PA-C  montelukast (SINGULAIR) 10 MG tablet Take 1 tablet (10 mg total) by mouth daily. 08/13/15   [name], MD  Multiple Vitamins-Minerals (MULTIVITAMIN WITH MINERALS) tablet Take 1 tablet by mouth daily.    Historical Provider, MD  multivitamin-lutein (OCUVITE-LUTEIN) CAPS capsule Take 1 capsule by mouth daily.    Historical Provider, MD  NEXIUM 20 MG capsule TAKE 1 CAPSULE (20 MG TOTAL) BY MOUTH DAILY AT 12 NOON. 04/02/15   [name], MD  oxyCODONE-acetaminophen (PERCOCET) 10-325 MG tablet Take 1 tablet by mouth every 4 (four) hours as needed for pain.    Historical Provider, MD  pentosan polysulfate (ELMIRON) 100 MG capsule Take 200 mg by mouth 3 (three) times daily before meals.     Historical Provider, MD    Phosphatidylserine-DHA-EPA (VAYACOG) 100-19.5-6.5 MG CAPS  07/06/14   Historical Provider, MD  pravastatin (PRAVACHOL) 40 MG tablet TAKE 1 TABLET BY MOUTH AT BEDTIME 07/17/15   [name], MD  ramelteon (ROZEREM) 8 MG tablet Take 8 mg by mouth.    Historical Provider, MD  sertraline (ZOLOFT) 50 MG tablet Take 50 mg by mouth 2 (two) times daily.     Historical Provider, MD  tadalafil (CIALIS) 5 MG tablet Take 5 mg by mouth.    Historical Provider, MD  triamcinolone cream (KENALOG) 0.1 % Apply 1 application topically 4 (four) times daily as needed. 05/10/15   Courtney Forcucci, PA-C  trospium (SANCTURA) 20 MG tablet 20 mg 2 (two) times daily.  06/22/15   Historical Provider, MD  zinc gluconate 50 MG tablet Take 50 mg by mouth daily.    Historical Provider, MD  zolpidem (AMBIEN) 10 MG tablet Take 10 mg by mouth.    Historical Provider, MD    Family History Family History  Problem Relation Age of Onset  . Diabetes Paternal Uncle   . Prostate cancer    . Cancer Father     lymphoma, colon  . Diabetes Maternal Grandmother   . Heart disease Maternal Grandfather   . Diabetes Maternal Grandfather   . Diabetes Paternal Grandmother   . Diabetes Paternal Grandfather     Social History Social History  Substance Use Topics  . Smoking status: Former Smoker    Quit date: 05/27/1995  . Smokeless tobacco: Never Used  . Alcohol use Yes     Comment: rarely     Allergies   Bee venom; Cymbalta [duloxetine hcl]; Fenofibrate; Ppd [tuberculin purified protein derivative]; and Verapamil   Review of Systems Review of Systems  Constitutional: Positive for chills and fever.  HENT: Negative for ear pain and sore throat.   Eyes: Negative for pain and visual disturbance.  Respiratory: Positive for cough. Negative for shortness of breath.   Cardiovascular: Negative for chest pain and palpitations.  Gastrointestinal: Negative for abdominal pain and vomiting.  Genitourinary: Positive for decreased  urine volume. Negative for dysuria and hematuria.  Musculoskeletal: Negative for arthralgias and back pain.  Skin: Negative for color change and rash.  Neurological: Negative for seizures and syncope.  All other systems reviewed and are negative.    Physical Exam Updated Vital Signs BP (!) 97/41   Pulse 84   Temp 101.1 F (38.4 C) (Oral)   Resp 15   Wt (!) 159.7 kg   SpO2 92%   BMI 47.08 kg/m   Physical Exam  Constitutional: He appears well-developed and well-nourished.  Morbidly obese  HENT:  Head: Normocephalic and atraumatic.  Eyes: Conjunctivae are normal.  Neck: Neck supple.  Cardiovascular: Normal rate and regular rhythm.   No murmur heard. Pulmonary/Chest: Effort normal and breath sounds normal. No respiratory distress.  Abdominal: Soft. There is no tenderness.  Musculoskeletal: He exhibits no edema.  Neurological: He is alert.  Skin: Skin is warm and dry.  Psychiatric: He has a normal mood and affect.  Nursing note and vitals reviewed.    ED Treatments / Results  Labs (all labs ordered are listed, but only abnormal results are displayed) Labs Reviewed  COMPREHENSIVE METABOLIC PANEL - Abnormal; Notable for the following:       Result Value   Chloride 100 (*)    Glucose, Bld 115 (*)    Creatinine, Ser 2.21 (*)    Total Bilirubin 2.1 (*)    GFR calc non Af Amer 32 (*)    GFR calc Af Amer 37 (*)    All other components within normal limits  CBC WITH DIFFERENTIAL/PLATELET - Abnormal; Notable for the following:    WBC 19.9 (*)    Neutro Abs 16.4 (*)    Monocytes Absolute 1.6 (*)    All other components within normal limits  I-STAT CG4 LACTIC ACID, ED - Abnormal; Notable for the following:    Lactic Acid, Venous 2.74 (*)    All other components within normal limits  CULTURE, BLOOD (ROUTINE X 2)  CULTURE, BLOOD (ROUTINE X 2)  URINE CULTURE  URINALYSIS, ROUTINE W REFLEX MICROSCOPIC (NOT AT [facility])  I-STAT BETA HCG BLOOD, ED ([facility], [facility], [facility]  ONLY)  I-STAT CG4  LACTIC ACID, ED  I-STAT CG4 LACTIC ACID, ED    EKG  EKG Interpretation None       Radiology Dg Chest 2 View  Result Date: 01/15/2016 CLINICAL DATA:  Cough. EXAM: CHEST  2 VIEW COMPARISON:  Radiographs of March 21, 2014. FINDINGS: The heart size and mediastinal contours are within normal limits. Both lungs are clear. No pneumothorax or pleural effusion is noted. The visualized skeletal structures are unremarkable. IMPRESSION: No active cardiopulmonary disease. Electronically Signed   By: Marijo Conception, M.D.   On: 01/15/2016 14:46    Procedures Procedures (including critical care time)  Medications Ordered in ED Medications  acetaminophen (TYLENOL) 325 MG tablet (not administered)  sodium chloride 0.9 % bolus 1,000 mL (not administered)    And  sodium chloride 0.9 % bolus 1,000 mL (not administered)    And  sodium chloride 0.9 % bolus 1,000 mL (not administered)    And  sodium chloride 0.9 % bolus 1,000 mL (not administered)    And  sodium chloride 0.9 % bolus 1,000 mL (1,000 mLs Intravenous New Bag/Given 01/15/16 1611)  cefTRIAXone (ROCEPHIN) 1 g in dextrose 5 % 50 mL IVPB (not administered)  azithromycin (ZITHROMAX) 500 mg in dextrose 5 % 250 mL IVPB (not administered)  acetaminophen (TYLENOL) tablet 325 mg (325 mg Oral Given 01/15/16 1335)  acetaminophen (TYLENOL) tablet 325 mg (325 mg Oral Given 01/15/16 1337)     Initial Impression / Assessment and Plan / ED Course  I have reviewed the triage vital signs and the nursing notes.  Pertinent labs & imaging results that were available during my care of the patient were reviewed by me and considered in my medical decision making (see chart for details).  Clinical Course    Patient identified as possible sepsis, and code sepsis was initiated with fluids, labs, blood cultures, and empiric antibiotics.  Possible source: pulmonary.  Patient overall appears well, but is tachcyardic, hypotensive, tachypneic, and has an  elevated lactate.  Labs significant for: leukocytosis indicating likely infection, lactic acidosis (infectious + dehydration), elevated creatinine represents AKI (likely due to dehydration), UA shows concentrated urine with possible UTI.  He was able to urinate easily, so doubt obstruction.    Repeat sepsis assessment completed.  BP improved with fluids.  Patient in no distress.  Will admit to hospitalist for further care.  Final Clinical Impressions(s) / ED Diagnoses   Final diagnoses:  None    New Prescriptions New Prescriptions   No medications on file     Levada Schilling, MD 01/15/16 1952    Drenda Freeze, MD 01/16/16 [phone]

## 2016-01-15 NOTE — ED Notes (Signed)
Report Called  

## 2016-01-15 NOTE — ED Notes (Signed)
Admitting MD at bedside.

## 2016-01-15 NOTE — H&P (Signed)
History and Physical    Mark Harrell [id number] DOB: Aug 24, 1960 DOA: 01/15/2016  PCP: [name], MD   Patient coming from: Home.  Chief Complaint: Cough.  HPI: Mark Harrell is a 55 y.o. male with medical history significant of asthma, BPH, dementia, hyperlipidemia, hypertension, male hypogonadism, IBS, obstructive sleep apnea ( not on CPAP) who came to the emergency department due to cough for about a week.  Per patient,about a week ago he started having a productive cough of clear, but occasional greenish sputum. He mentions that he thought the Cymbalta that was started earlier this month was worsening his asthma, so he discontinued it after 11 days of treatment last Friday.   Last night, around 0230, he woke up to go to the bathroom. When he returned back to bed, he felt what he describes as a spasm around his trunk and in his extremities associated with chills and rigors, followed by night sweats. He stays that he took his vital signs and the results were VS BP 148/87, HR  127, temp 102.1 F. He also states that he has been having decreased urine volume since last night.  He subsequently went to see his PCP around 1130 today,  who subsequently referred him to the emergency department. He states that he felt confused while at the PCPs office.  ED Course: The patient received 5 L of normal saline and IV antibiotics experiencing relief. Workup shows urine analysis with leukocyturia and bacteriuria. WBC was 19.9 K and lactic acid was 3.22 mmol per liter.  Review of Systems: As per HPI otherwise 10 point review of systems negative.    Past Medical History:  Diagnosis Date  . Anesthesia complication requiring reversal agent administration    ? from central apnea, very difficult to get off vent  . Asthma   . BPH (benign prostatic hyperplasia)   . Dementia   . Hyperlipidemia   . Hypertension   . Hypogonadism male   . IBS (irritable bowel syndrome)   . OSA (obstructive  sleep apnea)   . Prostatitis     Past Surgical History:  Procedure Laterality Date  . CYSTOSCOPY     Tannebaum     reports that he quit smoking about 20 years ago. He has never used smokeless tobacco. He reports that he drinks alcohol. He reports that he does not use drugs.  Allergies  Allergen Reactions  . Bee Venom Swelling  . Cymbalta [Duloxetine Hcl]   . Fenofibrate     Back pain  . Ppd [Tuberculin Purified Protein Derivative]     +ppd NEG Quantferron Gold 3/13  . Verapamil     Family History  Problem Relation Age of Onset  . Diabetes Paternal Uncle   . Prostate cancer    . Cancer Father     lymphoma, colon  . Diabetes Maternal Grandmother   . Heart disease Maternal Grandfather   . Diabetes Maternal Grandfather   . Diabetes Paternal Grandmother   . Diabetes Paternal Grandfather     Prior to Admission medications   Medication Sig Start Date End Date Taking? Authorizing Provider  albuterol (PROAIR HFA) 108 (90 Base) MCG/ACT inhaler INHALE 2 PUFFS INTO THE LUNGS EVERY 4 (FOUR) HOURS AS NEEDED FOR WHEEZING OR SHORTNESS OF BREATH. 08/13/15   [name], MD  alfuzosin (UROXATRAL) 10 MG 24 hr tablet Take 10 mg by mouth daily.      Historical Provider, MD  anastrozole (ARIMIDEX) 1 MG tablet Take 1 mg by mouth  daily. 08/08/15   Historical Provider, MD  atenolol (TENORMIN) 100 MG tablet TAKE 1 TABLET BY MOUTH EVERY DAY 09/26/15   [name], MD  azelastine (ASTELIN) 0.1 % nasal spray Place 2 sprays into both nostrils 2 (two) times daily. 08/29/14   Courtney Forcucci, PA-C  baclofen (LIORESAL) 10 MG tablet TAKE 1 TABLET BY MOUTH 3 TIMES A DAY AS NEEDED 03/22/14   Historical Provider, MD  Black Cohosh 160 MG CAPS Take 160 mg by mouth daily.    Historical Provider, MD  Calcium Carbonate-Vitamin D (CALCIUM + D PO) Take 500 mg by mouth 2 (two) times daily.     Historical Provider, MD  cetirizine (ZYRTEC) 10 MG tablet Take 10 mg by mouth daily.    Historical Provider, MD    Cholecalciferol (VITAMIN D PO) Take 5,000 Units by mouth 4 (four) times daily.    Historical Provider, MD  Cinnamon 500 MG capsule Take 500 mg by mouth daily.    Historical Provider, MD  co-enzyme Q-10 30 MG capsule Take 30 mg by mouth 3 (three) times daily.    Historical Provider, MD  cyclobenzaprine (FLEXERIL) 10 MG tablet 3 (three) times daily.  07/28/13   Historical Provider, MD  desmopressin (DDAVP) 0.2 MG tablet Take 200 mcg by mouth at bedtime. 04/10/14   Historical Provider, MD  donepezil (ARICEPT) 23 MG TABS tablet Take 23 mg by mouth at bedtime.    Historical Provider, MD  Eszopiclone (ESZOPICLONE) 3 MG TABS Take 3 mg by mouth.    Historical Provider, MD  famotidine (PEPCID) 20 MG tablet Take 1 tablet (20 mg total) by mouth 2 (two) times daily. 08/21/14 08/21/15  Courtney Forcucci, PA-C  Flaxseed, Linseed, (FLAX SEED OIL) 1000 MG CAPS Take 1,000 mg by mouth daily.    Historical Provider, MD  gabapentin (NEURONTIN) 600 MG tablet 1,200 mg 3 (three) times daily.  07/28/13   Historical Provider, MD  Garlic 123XX123 MG TABS Take 100 mg by mouth daily.    Historical Provider, MD  glucosamine-chondroitin 500-400 MG tablet Take 1 tablet by mouth 3 (three) times daily.    Historical Provider, MD  L-METHYLFOLATE CALCIUM PO Take by mouth. 11/24/13   Historical Provider, MD  Lecithin 1200 MG CAPS Take by mouth.    Historical Provider, MD  lidocaine (LIDODERM) 5 % Place 1 patch onto the skin daily. Remove & Discard patch within 12 hours or as directed by MD    Historical Provider, MD  memantine (NAMENDA) 10 MG tablet Take 20 mg by mouth daily.     Historical Provider, MD  metFORMIN (GLUCOPHAGE XR) 500 MG 24 hr tablet Take one tablet twice daily with breakfast and dinner. 05/10/15 05/09/16  Starlyn Skeans, PA-C  Methylfol-Algae-B12-Acetylcyst (CEREFOLIN NAC) 6-90.314-2-600 MG TABS  08/03/12   Historical Provider, MD  mirabegron ER (MYRBETRIQ) 50 MG TB24 tablet  12/26/12   Historical Provider, MD   mometasone-formoterol (DULERA) 200-5 MCG/ACT AERO Inhale 2 puffs into the lungs 2 (two) times daily. 11/02/14   Courtney Forcucci, PA-C  montelukast (SINGULAIR) 10 MG tablet Take 1 tablet (10 mg total) by mouth daily. 08/13/15   [name], MD  Multiple Vitamins-Minerals (MULTIVITAMIN WITH MINERALS) tablet Take 1 tablet by mouth daily.    Historical Provider, MD  multivitamin-lutein (OCUVITE-LUTEIN) CAPS capsule Take 1 capsule by mouth daily.    Historical Provider, MD  NEXIUM 20 MG capsule TAKE 1 CAPSULE (20 MG TOTAL) BY MOUTH DAILY AT 12 NOON. 04/02/15   [name], MD  oxyCODONE-acetaminophen (  PERCOCET) 10-325 MG tablet Take 1 tablet by mouth every 4 (four) hours as needed for pain.    Historical Provider, MD  pentosan polysulfate (ELMIRON) 100 MG capsule Take 200 mg by mouth 3 (three) times daily before meals.     Historical Provider, MD  Phosphatidylserine-DHA-EPA (VAYACOG) 100-19.5-6.5 MG CAPS  07/06/14   Historical Provider, MD  pravastatin (PRAVACHOL) 40 MG tablet TAKE 1 TABLET BY MOUTH AT BEDTIME 07/17/15   [name], MD  ramelteon (ROZEREM) 8 MG tablet Take 8 mg by mouth.    Historical Provider, MD  sertraline (ZOLOFT) 50 MG tablet Take 50 mg by mouth 2 (two) times daily.     Historical Provider, MD  tadalafil (CIALIS) 5 MG tablet Take 5 mg by mouth.    Historical Provider, MD  triamcinolone cream (KENALOG) 0.1 % Apply 1 application topically 4 (four) times daily as needed. 05/10/15   Courtney Forcucci, PA-C  trospium (SANCTURA) 20 MG tablet 20 mg 2 (two) times daily.  06/22/15   Historical Provider, MD  zinc gluconate 50 MG tablet Take 50 mg by mouth daily.    Historical Provider, MD  zolpidem (AMBIEN) 10 MG tablet Take 10 mg by mouth.    Historical Provider, MD    Physical Exam: Vitals:   01/15/16 1733 01/15/16 1745 01/15/16 1800 01/15/16 1815  BP:  (!) 103/45 (!) 102/46 105/65  Pulse:  87 87 86  Resp:  13 14 13   Temp: 98.4 F (36.9 C)     TempSrc: Oral     SpO2:   92% 93% 95%  Weight:          Constitutional: NAD, calm, comfortable Vitals:   01/15/16 1733 01/15/16 1745 01/15/16 1800 01/15/16 1815  BP:  (!) 103/45 (!) 102/46 105/65  Pulse:  87 87 86  Resp:  13 14 13   Temp: 98.4 F (36.9 C)     TempSrc: Oral     SpO2:  92% 93% 95%  Weight:       Eyes: PERRL, lids and conjunctivae normal ENMT: Mucous membranes are mildly dry. Posterior pharynx clear of any exudate or lesions. Neck: normal, supple, no masses, no thyromegaly Respiratory: clear to auscultation bilaterally, no wheezing, no crackles. Normal respiratory effort. No accessory muscle use.  Cardiovascular: Regular rate and rhythm, no murmurs / rubs / gallops. No extremity edema. 2+ pedal pulses. No carotid bruits.  Abdomen: Obese, bowel sounds positive, soft, no tenderness, no masses palpated. No hepatosplenomegaly. .  Musculoskeletal: no clubbing / cyanosis. No joint deformity upper and lower extremities. Good ROM, no contractures. Normal muscle tone.  Skin: no rashes, lesions, ulcers. No induration Neurologic: CN 2-12 grossly intact. Sensation intact, DTR normal. Strength 5/5 in all 4.  Psychiatric: Normal judgment and insight. Alert and oriented x 4. Normal mood.     Labs on Admission: I have personally reviewed following labs and imaging studies  CBC:  Recent Labs Lab 01/15/16 1341  WBC 19.9*  NEUTROABS 16.4*  HGB 16.0  HCT 49.6  MCV 92.2  PLT [id number]   Basic Metabolic Panel:  Recent Labs Lab 01/15/16 1341  NA 137  K 4.4  CL 100*  CO2 28  GLUCOSE 115*  BUN 16  CREATININE 2.21*  CALCIUM 9.6   GFR: Estimated Creatinine Clearance: 59.4 mL/min (by C-G formula based on SCr of 2.21 mg/dL). Liver Function Tests:  Recent Labs Lab 01/15/16 1341  AST 23  ALT 24  ALKPHOS 57  BILITOT 2.1*  PROT 6.8  ALBUMIN 3.8  Urine analysis:    Component Value Date/Time   COLORURINE ORANGE (A) 01/15/2016 1625   APPEARANCEUR TURBID (A) 01/15/2016 1625   LABSPEC 1.033 (H)  01/15/2016 1625   PHURINE 5.0 01/15/2016 1625   GLUCOSEU NEGATIVE 01/15/2016 1625   HGBUR MODERATE (A) 01/15/2016 1625   BILIRUBINUR SMALL (A) 01/15/2016 1625   KETONESUR 15 (A) 01/15/2016 1625   PROTEINUR 100 (A) 01/15/2016 1625   NITRITE NEGATIVE 01/15/2016 1625   LEUKOCYTESUR LARGE (A) 01/15/2016 1625     Radiological Exams on Admission: Dg Chest 2 View  Result Date: 01/15/2016 CLINICAL DATA:  Cough. EXAM: CHEST  2 VIEW COMPARISON:  Radiographs of March 21, 2014. FINDINGS: The heart size and mediastinal contours are within normal limits. Both lungs are clear. No pneumothorax or pleural effusion is noted. The visualized skeletal structures are unremarkable. IMPRESSION: No active cardiopulmonary disease. Electronically Signed   By: Marijo Conception, M.D.   On: 01/15/2016 14:46     Assessment/Plan Principal Problem:   Sepsis secondary to UTI [facility]) Admit to telemetry/inpatient Continue IV fluids. Continue IV antibiotics. Follow-up blood cultures and sensitivity. Follow-up urine culture and sensitivity.  Active Problems:   Hypertension Resume atenolol in a.m. if blood pressure allows. Monitor blood pressure closely.    Hyperlipidemia Continue pravastatin 40 mg by mouth daily. Monitor LFTs periodically.    BPH (benign prostatic hyperplasia) Continue Tylenol off until 5 mg by mouth daily. Continue Uroxatral 10 mg by mouth daily    Depression, controlled Continue Zoloft 50 mg by mouth twice a day.    Memory loss. Continue Namenda and donezepil.    Mild chronic obstructive pulmonary disease (HCC)   Asthma Continue bronchodilators as needed. Continue the Allegra inhaler twice a day. Continue Singulair 10 mg by mouth daily.    GERD (gastroesophageal reflux disease) Pantoprazole 40 mg by mouth daily.    OSA (obstructive sleep apnea) Not on CPAP. Supplemental oxygen at bedtime.   Glucose intolerance (impaired glucose tolerance) Carbohydrate modified diet. Continue  metformin 500 mg by mouth twice a day CBG monitoring.    Polypharmacy The patient takes multiple prescription medications, vitamins and supplements. Caution was advised, supplements held while the patient is in the hospital.    DVT prophylaxis: Lovenox SQ. Code Status: Full code.  Family Communication: Disposition Plan: Admit for IV antibiotic therapy for several days. Consults called: Admission status: Inpatient/telemetry.   Reubin Milan MD [facility] Pager [phone].  If 7PM-7AM, please contact night-coverage [email or web address] Password [facility]  01/15/2016, 6:57 PM

## 2016-01-15 NOTE — ED Notes (Signed)
This RN transported pt from Waiting Room to treatment room.

## 2016-01-15 NOTE — Progress Notes (Signed)
Subjective:    Patient ID: Mark Harrell, male    DOB: Oct 25, 1960, 55 y.o.   MRN: [id number]  HPI  This very nice 55 yo SWM with multiple medical co-morbidities and poly pharmacy presented acutely somewhat confused wit c/o 1 week prodrome of increasing congestion and 55 +/- hr hx/o fever to 102 deg, chills, expectoration of green sputum , decreased urinary output and also rambled on left knee pains.   Medication Sig  . albuterol (PROAIR HFA) 108 (90 Base) MCG/ACT inhaler INHALE 2 PUFFS INTO THE LUNGS EVERY 4 (FOUR) HOURS AS NEEDED FOR WHEEZING OR SHORTNESS OF BREATH.  [name] alfuzosin (UROXATRAL) 10 MG 24 hr tablet Take 10 mg by mouth daily.    [name] anastrozole (ARIMIDEX) 1 MG tablet Take 1 mg by mouth daily.  [name] atenolol (TENORMIN) 100 MG tablet TAKE 1 TABLET BY MOUTH EVERY DAY  . azelastine (ASTELIN) 0.1 % nasal spray Place 2 sprays into both nostrils 2 (two) times daily.  . Black Cohosh 160 MG CAPS Take 160 mg by mouth daily.  . Calcium Carbonate-Vitamin D (CALCIUM + D PO) Take 500 mg by mouth 2 (two) times daily.   . cetirizine (ZYRTEC) 10 MG tablet Take 10 mg by mouth daily.  . Cinnamon 500 MG capsule Take 500 mg by mouth daily.  [name] co-enzyme Q-10 30 MG capsule Take 30 mg by mouth 3 (three) times daily.  . cyclobenzaprine (FLEXERIL) 10 MG tablet Take 10 mg by mouth 3 (three) times daily as needed for muscle spasms.   [name] desmopressin (DDAVP) 0.2 MG tablet Take 200 mcg by mouth at bedtime.  . donepezil (ARICEPT) 23 MG TABS tablet Take 23 mg by mouth at bedtime.  . Eszopiclone (ESZOPICLONE) 3 MG TABS Take 3 mg by mouth at bedtime.   . Flaxseed, Linseed, (FLAX SEED OIL) 1000 MG CAPS Take 1,000 mg by mouth daily.  [name] gabapentin (NEURONTIN) 600 MG tablet 1,200 mg 3 (three) times daily.   . Garlic 123XX123 MG TABS Take 100 mg by mouth daily.  [name] glucosamine-chondroitin 500-400 MG tablet Take 1 tablet by mouth 3 (three) times daily.  . L-METHYLFOLATE CALCIUM PO Take 1 tablet by mouth daily.   . Lecithin 1200 MG  CAPS Take 1 capsule by mouth daily.   [name] lidocaine (LIDODERM) 5 % Place 1 patch onto the skin daily. Remove & Discard patch within 12 hours or as directed by MD  . memantine (NAMENDA) 10 MG tablet Take 20 mg by mouth daily.   . metFORMIN (GLUCOPHAGE XR) 500 MG 24 hr tablet Take one tablet twice daily with breakfast and dinner.  . Methylfol-Algae-B12-Acetylcyst (CEREFOLIN NAC) 6-90.314-2-600 MG TABS Take 1 tablet by mouth daily.   . mirabegron ER (MYRBETRIQ) 50 MG TB24 tablet Take 50 mg by mouth daily.   . mometasone-formoterol (DULERA) 200-5 MCG/ACT AERO Inhale 2 puffs into the lungs 2 (two) times daily.  . montelukast (SINGULAIR) 10 MG tablet Take 1 tablet (10 mg total) by mouth daily.  . Multiple Vitamins-Minerals (MULTIVITAMIN WITH MINERALS) tablet Take 1 tablet by mouth daily.  . multivitamin-lutein (OCUVITE-LUTEIN) CAPS capsule Take 1 capsule by mouth daily.  [name] NEXIUM 20 MG capsule TAKE 1 CAPSULE (20 MG TOTAL) BY MOUTH DAILY AT 12 NOON.  [name] oxyCODONE-acetaminophen (PERCOCET) 10-325 MG tablet Take 1 tablet by mouth every 4 (four) hours as needed for pain.  . pentosan polysulfate (ELMIRON) 100 MG capsule Take 200 mg by mouth 3 (three) times daily before meals.   . Phosphatidylserine-DHA-EPA (VAYACOG)  100-19.5-6.5 MG CAPS Take 1 capsule by mouth daily.   . pravastatin (PRAVACHOL) 40 MG tablet TAKE 1 TABLET BY MOUTH AT BEDTIME  . ramelteon (ROZEREM) 8 MG tablet Take 8 mg by mouth.  . sertraline (ZOLOFT) 50 MG tablet Take 50 mg by mouth 2 (two) times daily.   . tadalafil (CIALIS) 5 MG tablet Take 5 mg by mouth.  . triamcinolone cream (KENALOG) 0.1 % Apply 1 application topically 4 (four) times daily as needed. (Patient not taking: Reported on 01/15/2016)  . trospium (SANCTURA) 20 MG tablet 20 mg 2 (two) times daily.   [name] zinc gluconate 50 MG tablet Take 50 mg by mouth daily.  [name] zolpidem (AMBIEN) 10 MG tablet Take 10 mg by mouth.  . baclofen (LIORESAL) 10 MG tablet TAKE 1 TABLET BY MOUTH 3 TIMES A  DAY AS NEEDED  . famotidine (PEPCID) 20 MG tablet Take 1 tablet (20 mg total) by mouth 2 (two) times daily.  . Cholecalciferol (VITAMIN D3) 10000 UNITS TABS Take 4 tablets by mouth daily.  . metFORMIN (GLUCOPHAGE XR) 500 MG 24 hr tablet Take 500 mg by mouth.   Allergies  Allergen Reactions  . Bee Venom Swelling  . Cymbalta [Duloxetine Hcl]   . Fenofibrate     Back pain  . Ppd [Tuberculin Purified Protein Derivative]     +ppd NEG Quantferron Gold 3/13  . Verapamil    Past Medical History:  Diagnosis Date  . Anesthesia complication requiring reversal agent administration    ? from central apnea, very difficult to get off vent  . Asthma   . BPH (benign prostatic hyperplasia)   . Dementia   . Hyperlipidemia   . Hypertension   . Hypogonadism male   . IBS (irritable bowel syndrome)   . OSA (obstructive sleep apnea)   . Prostatitis    Past Surgical History:  Procedure Laterality Date  . ANKLE FRACTURE SURGERY Right   . CYSTOSCOPY     Tannebaum  . TONSILLECTOMY    . TURBINATE RESECTION  2007   Review of Systems 10 point systems review negative except as above.    Objective:   Physical Exam  BP 90/60   Pulse 96   Temp (!) 100.4 F (38 C)   Resp 16   Ht 6' 0.5" (1.842 m)   Wt (!) 363 lb 6.4 oz (164.8 kg)   SpO2 (!) 88%   BMI 48.61 kg/m   Confused, disheveled and severely morbidly obese. Skin pale & clammy.   HEENT - Eac's patent. TM's Nl. EOM's full. PERRLA. NasoOroPharynx clear. Neck - supple. No JVD. Chest -decreased BS w/o Rales, rhonchi, wheezes evident due to chest wall adiposity. Cor - HS soft.  RRR w/o sig M. Radial pu;lses thready. No edema. Abd - Soft & rotund and non tender. MS- FROM w/o deformities. Muscle power, tone and bulk Nl. Gait Nl. Neuro - No obvious Cr N abnormalities. Other than mild confusion & confabulation there were no focal abnormalities. Psyche - Mental status dulled & but slowly orients x 3.      Assessment & Plan:   1. CAP  (community acquired pneumonia)   2. Sepsis, due to unspecified organism [facility])  - Patient's uncle , Pia Mau was contacted and promptly came in 10-15 minutes to transport the patient to [facility] ER for evaluation for potential admission.

## 2016-01-15 NOTE — ED Triage Notes (Signed)
Onset 1 week productive cough- clear sputum.  Onset last night pt went to bathroom and reports he went into a full spasm that lasted 2 1/2 hours, took own VS BP 148/87, HR  127, temp 102.1.  Has had decreased urination since last night.  Pt was in doctors office today and felt he was confused.

## 2016-01-15 NOTE — ED Notes (Signed)
PER Dr.yao finish zosyn infusion as it is already infusing.

## 2016-01-16 ENCOUNTER — Other Ambulatory Visit: Payer: Self-pay | Admitting: Internal Medicine

## 2016-01-16 ENCOUNTER — Encounter (HOSPITAL_COMMUNITY): Payer: Self-pay | Admitting: *Deleted

## 2016-01-16 DIAGNOSIS — G4733 Obstructive sleep apnea (adult) (pediatric): Secondary | ICD-10-CM

## 2016-01-16 DIAGNOSIS — N400 Enlarged prostate without lower urinary tract symptoms: Secondary | ICD-10-CM

## 2016-01-16 DIAGNOSIS — I10 Essential (primary) hypertension: Secondary | ICD-10-CM

## 2016-01-16 DIAGNOSIS — E785 Hyperlipidemia, unspecified: Secondary | ICD-10-CM

## 2016-01-16 DIAGNOSIS — Z79899 Other long term (current) drug therapy: Secondary | ICD-10-CM

## 2016-01-16 DIAGNOSIS — J449 Chronic obstructive pulmonary disease, unspecified: Secondary | ICD-10-CM

## 2016-01-16 DIAGNOSIS — F329 Major depressive disorder, single episode, unspecified: Secondary | ICD-10-CM

## 2016-01-16 DIAGNOSIS — K219 Gastro-esophageal reflux disease without esophagitis: Secondary | ICD-10-CM

## 2016-01-16 DIAGNOSIS — N390 Urinary tract infection, site not specified: Secondary | ICD-10-CM

## 2016-01-16 DIAGNOSIS — A419 Sepsis, unspecified organism: Principal | ICD-10-CM

## 2016-01-16 DIAGNOSIS — R7302 Impaired glucose tolerance (oral): Secondary | ICD-10-CM

## 2016-01-16 LAB — COMPREHENSIVE METABOLIC PANEL
ALK PHOS: 44 U/L (ref 38–126)
ALT: 21 U/L (ref 17–63)
ANION GAP: 6 (ref 5–15)
AST: 19 U/L (ref 15–41)
Albumin: 3 g/dL — ABNORMAL LOW (ref 3.5–5.0)
BILIRUBIN TOTAL: 1.3 mg/dL — AB (ref 0.3–1.2)
BUN: 14 mg/dL (ref 6–20)
CALCIUM: 8.7 mg/dL — AB (ref 8.9–10.3)
CO2: 28 mmol/L (ref 22–32)
Chloride: 104 mmol/L (ref 101–111)
Creatinine, Ser: 1.1 mg/dL (ref 0.61–1.24)
Glucose, Bld: 101 mg/dL — ABNORMAL HIGH (ref 65–99)
Potassium: 3.7 mmol/L (ref 3.5–5.1)
Sodium: 138 mmol/L (ref 135–145)
TOTAL PROTEIN: 5.6 g/dL — AB (ref 6.5–8.1)

## 2016-01-16 LAB — GLUCOSE, CAPILLARY
GLUCOSE-CAPILLARY: 104 mg/dL — AB (ref 65–99)
GLUCOSE-CAPILLARY: 121 mg/dL — AB (ref 65–99)
Glucose-Capillary: 151 mg/dL — ABNORMAL HIGH (ref 65–99)
Glucose-Capillary: 123 mg/dL — ABNORMAL HIGH (ref 65–99)

## 2016-01-16 LAB — CBC WITH DIFFERENTIAL/PLATELET
BASOS ABS: 0 10*3/uL (ref 0.0–0.1)
BASOS PCT: 0 %
Eosinophils Absolute: 0 10*3/uL (ref 0.0–0.7)
Eosinophils Relative: 0 %
HEMATOCRIT: 43.8 % (ref 39.0–52.0)
HEMOGLOBIN: 13.8 g/dL (ref 13.0–17.0)
Lymphocytes Relative: 16 %
Lymphs Abs: 1.9 10*3/uL (ref 0.7–4.0)
MCH: 29.2 pg (ref 26.0–34.0)
MCHC: 31.5 g/dL (ref 30.0–36.0)
MCV: 92.6 fL (ref 78.0–100.0)
Monocytes Absolute: 1.3 10*3/uL — ABNORMAL HIGH (ref 0.1–1.0)
Monocytes Relative: 11 %
NEUTROS ABS: 8.5 10*3/uL — AB (ref 1.7–7.7)
NEUTROS PCT: 73 %
Platelets: 216 10*3/uL (ref 150–400)
RBC: 4.73 MIL/uL (ref 4.22–5.81)
RDW: 15.3 % (ref 11.5–15.5)
WBC: 11.8 10*3/uL — ABNORMAL HIGH (ref 4.0–10.5)

## 2016-01-16 LAB — LACTIC ACID, PLASMA: Lactic Acid, Venous: 1.4 mmol/L (ref 0.5–1.9)

## 2016-01-16 LAB — STREP PNEUMONIAE URINARY ANTIGEN: STREP PNEUMO URINARY ANTIGEN: NEGATIVE

## 2016-01-16 MED ORDER — ORAL CARE MOUTH RINSE
7.0000 mL | Freq: Two times a day (BID) | OROMUCOSAL | Status: DC
  Administered 2016-01-16 – 2016-01-18 (×4): 7 mL via OROMUCOSAL

## 2016-01-16 MED ORDER — TADALAFIL 5 MG PO TABS
5.0000 mg | ORAL_TABLET | Freq: Every day | ORAL | Status: DC
  Administered 2016-01-16 – 2016-01-19 (×4): 5 mg via ORAL
  Filled 2016-01-16 (×4): qty 1

## 2016-01-16 MED ORDER — ACETAMINOPHEN 325 MG PO TABS
650.0000 mg | ORAL_TABLET | Freq: Four times a day (QID) | ORAL | Status: DC | PRN
  Administered 2016-01-16 – 2016-01-18 (×2): 650 mg via ORAL
  Filled 2016-01-16: qty 2

## 2016-01-16 MED ORDER — TADALAFIL 5 MG PO TABS: 5.0000 mg | ORAL_TABLET | Freq: Every day | ORAL | Status: DC

## 2016-01-16 NOTE — Progress Notes (Signed)
New Admission Note:   Arrival Method: stretcher Mental Orientation: alert and oriented x 4 Telemetry:yes Assessment: see flow sheet Skin:intact [id number] f/a Pain: denies at this time Tubes:none Safety Measures: Safety Fall Prevention Plan has been , discussed  Admission:see flow sheet [address] Orientation: Patient has been orientated to the room, unit and staff.  Family: at bedside  Orders have been reviewed and implemented. Will continue to monitor the patient. Call light has been placed within reach and bed alarm has been activated.   [name] Coe, RN Phone number: [phone]

## 2016-01-16 NOTE — Progress Notes (Signed)
PROGRESS NOTE    Mark Harrell  [id number] DOB: 1961/05/22 DOA: 01/15/2016 PCP: [name], MD   Chief Complaint  Patient presents with  . Fever  . Cough    Brief Narrative:  HPI On 01/15/2016 by Dr. Tennis Must Freddrick Mark Harrell is a 55 y.o. male with medical history significant of asthma, BPH, dementia, hyperlipidemia, hypertension, male hypogonadism, IBS, obstructive sleep apnea ( not on CPAP) who came to the emergency department due to cough for about a week. Per patient,about a week ago he started having a productive cough of clear, but occasional greenish sputum. He mentions that he thought the Cymbalta that was started earlier this month was worsening his asthma, so he discontinued it after 11 days of treatment last Friday.  Last night, around 0230, he woke up to go to the bathroom. When he returned back to bed, he felt what he describes as a spasm around his trunk and in his extremities associated with chills and rigors, followed by night sweats. He stays that he took his vital signs and the results were VS BP 148/87, HR 127, temp 102.1 F. He also states that he has been having decreased urine volume since last night. He subsequently went to see his PCP around 1130 today,  who subsequently referred him to the emergency department. He states that he felt confused while at the PCPs office.  Assessment & Plan   Sepsis secondary to UTI [facility]) -Upon admission, patient was febrile with leukocytosis as well as tachycardia and tachypnea. Also had lactic acid level of 2. -Lactic acid is improving, currently 1.4 -UA: Many bacteria, large leukocytes, TNTC WBC -Pending urine and blood cultures -Continue ceftriaxone  Essential Hypertension -Patient did have soft blood pressure upon admission however currently normal. -Atenolol currently held  Hyperlipidemia -Continue statin  BPH (benign prostatic hyperplasia) -Continue Uroxatral   Depression, controlled -Continue  Zoloft  Memory loss. -Continue Namenda and donezepil.  Mild chronic obstructive pulmonary disease/Asthma/pulmonary HTN -Continue bronchodilators, Allegra, Singulair, Cialis -Currently stable  GERD (gastroesophageal reflux disease) -Continue PPI  OSA (obstructive sleep apnea) -Currently not on CPAP -Continue [place] as needed   Glucose intolerance (impaired glucose tolerance) -Patient denies having diabetes although currently on metformin -Hold metformin, will place on sliding insulin scale CBG monitoring  Polypharmacy -The patient takes multiple prescription medications, vitamins and supplements. -Caution was advised, supplements held while the patient is in the hospital.  DVT Prophylaxis  Lovenox  Code Status: Full  Family Communication: None at bedside  Disposition Plan: Admitted.   Consultants None  Procedures  None  Antibiotics   Anti-infectives    Start     Dose/Rate Route Frequency Ordered Stop   01/15/16 2030  cefTRIAXone (ROCEPHIN) 2 g in dextrose 5 % 50 mL IVPB     2 g 100 mL/hr over 30 Minutes Intravenous Every 24 hours 01/15/16 2027 01/22/16 2029   01/15/16 2030  azithromycin (ZITHROMAX) 500 mg in dextrose 5 % 250 mL IVPB     500 mg 250 mL/hr over 60 Minutes Intravenous Every 24 hours 01/15/16 2027 01/22/16 2029   01/15/16 1615  vancomycin (VANCOCIN) 2,500 mg in sodium chloride 0.9 % 500 mL IVPB  Status:  Discontinued     2,500 mg 250 mL/hr over 120 Minutes Intravenous  Once 01/15/16 1607 01/15/16 1613   01/15/16 1615  cefTRIAXone (ROCEPHIN) 1 g in dextrose 5 % 50 mL IVPB  Status:  Discontinued     1 g 100 mL/hr over 30 Minutes Intravenous  Once 01/15/16 1613 01/15/16 1618   01/15/16 1615  azithromycin (ZITHROMAX) 500 mg in dextrose 5 % 250 mL IVPB     500 mg 250 mL/hr over 60 Minutes Intravenous  Once 01/15/16 1613 01/15/16 1737   01/15/16 1600  piperacillin-tazobactam (ZOSYN) IVPB 3.375 g  Status:  Discontinued     3.375 g 100 mL/hr over 30  Minutes Intravenous  Once 01/15/16 1559 01/15/16 1639   01/15/16 1600  vancomycin (VANCOCIN) IVPB 1000 mg/200 mL premix  Status:  Discontinued     1,000 mg 200 mL/hr over 60 Minutes Intravenous  Once 01/15/16 1559 01/15/16 1607      Subjective:   Mark Harrell seen and examined today.  Patient used to have some shortness of breath and cough. Currently denies any chest pain, abdominal pain, nausea or vomiting, diarrhea constipation, headache or dizziness.  Objective:   Vitals:   01/16/16 0401 01/16/16 0700 01/16/16 0833 01/16/16 0944  BP: (!) 112/50  116/61   Pulse: (!) 110  92 98  Resp: 18  17 18   Temp: (!) 102.9 F (39.4 C) (!) 100.4 F (38 C) 99.8 F (37.7 C)   TempSrc: Oral Oral Oral   SpO2: 96%  99% 99%  Weight:      Height:        Intake/Output Summary (Last 24 hours) at 01/16/16 1351 Last data filed at 01/16/16 1041  Gross per 24 hour  Intake             2320 ml  Output             1750 ml  Net              570 ml   Filed Weights   01/15/16 1605 01/15/16 2024  Weight: (!) 159.7 kg (352 lb) (!) 170 kg (374 lb 12.5 oz)    Exam  General: Well developed, well nourished, NAD, appears stated age  HEENT: NCAT, mucous membranes moist.   Cardiovascular: S1 S2 auscultated, no murmurs, RRR  Respiratory: Diminished however clear, no wheezing. Dry nonproductive cough  Abdomen: Soft, obese, nontender, nondistended, + bowel sounds  Extremities: warm dry without cyanosis clubbing or edema  Neuro: AAOx3, nonfocal  Psych: Normal affect and demeanor with intact judgement and insight  Data Reviewed: I have personally reviewed following labs and imaging studies  CBC:  Recent Labs Lab 01/15/16 1341 01/16/16 0612  WBC 19.9* 11.8*  NEUTROABS 16.4* 8.5*  HGB 16.0 13.8  HCT 49.6 43.8  MCV 92.2 92.6  PLT 264 123XX123   Basic Metabolic Panel:  Recent Labs Lab 01/15/16 1341 01/16/16 0612  NA 137 138  K 4.4 3.7  CL 100* 104  CO2 28 28  GLUCOSE 115* 101*  BUN 16 14   CREATININE 2.21* 1.10  CALCIUM 9.6 8.7*   GFR: Estimated Creatinine Clearance: 123.7 mL/min (by C-G formula based on SCr of 1.1 mg/dL). Liver Function Tests:  Recent Labs Lab 01/15/16 1341 01/16/16 0612  AST 23 19  ALT 24 21  ALKPHOS 57 44  BILITOT 2.1* 1.3*  PROT 6.8 5.6*  ALBUMIN 3.8 3.0*   No results for input(s): LIPASE, AMYLASE in the last 168 hours. No results for input(s): AMMONIA in the last 168 hours. Coagulation Profile: No results for input(s): INR, PROTIME in the last 168 hours. Cardiac Enzymes: No results for input(s): CKTOTAL, CKMB, CKMBINDEX, TROPONINI in the last 168 hours. BNP (last 3 results) No results for input(s): PROBNP in the last 8760 hours. HbA1C: No  results for input(s): HGBA1C in the last 72 hours. CBG:  Recent Labs Lab 01/16/16 0826 01/16/16 1128  GLUCAP 104* 121*   Lipid Profile: No results for input(s): CHOL, HDL, LDLCALC, TRIG, CHOLHDL, LDLDIRECT in the last 72 hours. Thyroid Function Tests: No results for input(s): TSH, T4TOTAL, FREET4, T3FREE, THYROIDAB in the last 72 hours. Anemia Panel: No results for input(s): VITAMINB12, FOLATE, FERRITIN, TIBC, IRON, RETICCTPCT in the last 72 hours. Urine analysis:    Component Value Date/Time   COLORURINE ORANGE (A) 01/15/2016 1625   APPEARANCEUR TURBID (A) 01/15/2016 1625   LABSPEC 1.033 (H) 01/15/2016 1625   PHURINE 5.0 01/15/2016 1625   GLUCOSEU NEGATIVE 01/15/2016 1625   HGBUR MODERATE (A) 01/15/2016 1625   BILIRUBINUR SMALL (A) 01/15/2016 1625   KETONESUR 15 (A) 01/15/2016 1625   PROTEINUR 100 (A) 01/15/2016 1625   NITRITE NEGATIVE 01/15/2016 1625   LEUKOCYTESUR LARGE (A) 01/15/2016 1625   Sepsis Labs: @LABRCNTIP (procalcitonin:4,lacticidven:4)  ) Recent Results (from the past 240 hour(s))  Culture, blood (Routine x 2)     Status: None (Preliminary result)   Collection Time: 01/15/16  1:40 PM  Result Value Ref Range Status   Specimen Description BLOOD LEFT ANTECUBITAL  Final    Special Requests BOTTLES DRAWN AEROBIC AND ANAEROBIC 5CC  Final   Culture NO GROWTH < 24 HOURS  Final   Report Status PENDING  Incomplete  Culture, blood (Routine x 2)     Status: None (Preliminary result)   Collection Time: 01/15/16  4:06 PM  Result Value Ref Range Status   Specimen Description BLOOD RIGHT FOREARM  Final   Special Requests BOTTLES DRAWN AEROBIC AND ANAEROBIC 5CC  Final   Culture NO GROWTH < 24 HOURS  Final   Report Status PENDING  Incomplete      Radiology Studies: Dg Chest 2 View  Result Date: 01/15/2016 CLINICAL DATA:  Cough. EXAM: CHEST  2 VIEW COMPARISON:  Radiographs of March 21, 2014. FINDINGS: The heart size and mediastinal contours are within normal limits. Both lungs are clear. No pneumothorax or pleural effusion is noted. The visualized skeletal structures are unremarkable. IMPRESSION: No active cardiopulmonary disease. Electronically Signed   By: Marijo Conception, M.D.   On: 01/15/2016 14:46     Scheduled Meds: . alfuzosin  10 mg Oral Daily  . anastrozole  1 mg Oral Daily  . azelastine  2 spray Each Nare BID  . azithromycin  500 mg Intravenous Q24H  . cefTRIAXone (ROCEPHIN)  IV  2 g Intravenous Q24H  . cyclobenzaprine  10 mg Oral TID  . darifenacin  7.5 mg Oral Daily  . desmopressin  200 mcg Oral QHS  . donepezil  23 mg Oral QHS  . enoxaparin (LOVENOX) injection  0.5 mg/kg Subcutaneous Q24H  . gabapentin  1,200 mg Oral TID  . memantine  20 mg Oral QHS  . mometasone-formoterol  2 puff Inhalation BID  . montelukast  10 mg Oral Daily  . pantoprazole  40 mg Oral Daily  . pentosan polysulfate  200 mg Oral TID AC  . pravastatin  40 mg Oral QHS  . sertraline  50 mg Oral BID  . sodium chloride  1,000 mL Intravenous Once  . sodium chloride flush  3 mL Intravenous Q12H  . tadalafil  5 mg Oral Daily   Continuous Infusions: . sodium chloride 125 mL/hr at 01/16/16 1212     LOS: 1 day   Time Spent in minutes   30 minutes  Angeni Chaudhuri D.O.  on  01/16/2016 at 1:51 PM  Between 7am to 7pm - Pager - [phone]  After 7pm go to [email or web address] - password [email or web address]  And look for the night coverage person covering for me after hours  [facility] Office  [phone]

## 2016-01-17 LAB — CBC WITH DIFFERENTIAL/PLATELET
Basophils Absolute: 0 10*3/uL (ref 0.0–0.1)
Basophils Relative: 1 %
EOS ABS: 0.2 10*3/uL (ref 0.0–0.7)
EOS PCT: 2 %
HCT: 42.2 % (ref 39.0–52.0)
HEMOGLOBIN: 13.3 g/dL (ref 13.0–17.0)
LYMPHS ABS: 2.2 10*3/uL (ref 0.7–4.0)
LYMPHS PCT: 26 %
MCH: 28.9 pg (ref 26.0–34.0)
MCHC: 31.5 g/dL (ref 30.0–36.0)
MCV: 91.5 fL (ref 78.0–100.0)
MONOS PCT: 14 %
Monocytes Absolute: 1.2 10*3/uL — ABNORMAL HIGH (ref 0.1–1.0)
Neutro Abs: 4.9 10*3/uL (ref 1.7–7.7)
Neutrophils Relative %: 57 %
PLATELETS: 199 10*3/uL (ref 150–400)
RBC: 4.61 MIL/uL (ref 4.22–5.81)
RDW: 15.3 % (ref 11.5–15.5)
WBC: 8.5 10*3/uL (ref 4.0–10.5)

## 2016-01-17 LAB — GLUCOSE, CAPILLARY
GLUCOSE-CAPILLARY: 97 mg/dL (ref 65–99)
GLUCOSE-CAPILLARY: 104 mg/dL — AB (ref 65–99)
Glucose-Capillary: 117 mg/dL — ABNORMAL HIGH (ref 65–99)
Glucose-Capillary: 113 mg/dL — ABNORMAL HIGH (ref 65–99)

## 2016-01-17 LAB — COMPREHENSIVE METABOLIC PANEL
ALBUMIN: 2.9 g/dL — AB (ref 3.5–5.0)
ALT: 24 U/L (ref 17–63)
AST: 21 U/L (ref 15–41)
Alkaline Phosphatase: 42 U/L (ref 38–126)
Anion gap: 8 (ref 5–15)
BUN: 8 mg/dL (ref 6–20)
CHLORIDE: 104 mmol/L (ref 101–111)
CO2: 25 mmol/L (ref 22–32)
CREATININE: 0.75 mg/dL (ref 0.61–1.24)
Calcium: 8.2 mg/dL — ABNORMAL LOW (ref 8.9–10.3)
GFR calc Af Amer: >60 mL/min (ref >60)
GFR calc non Af Amer: >60 mL/min (ref >60)
GLUCOSE: 92 mg/dL (ref 65–99)
POTASSIUM: 3.7 mmol/L (ref 3.5–5.1)
SODIUM: 137 mmol/L (ref 135–145)
Total Bilirubin: 1.1 mg/dL (ref 0.3–1.2)
Total Protein: 5.7 g/dL — ABNORMAL LOW (ref 6.5–8.1)

## 2016-01-17 LAB — C DIFFICILE QUICK SCREEN W PCR REFLEX
C DIFFICILE (CDIFF) INTERP: NOT DETECTED
C DIFFICILE (CDIFF) TOXIN: NEGATIVE
C Diff antigen: NEGATIVE

## 2016-01-17 LAB — URINE CULTURE: Culture: >=100000 — AB

## 2016-01-17 LAB — LEGIONELLA PNEUMOPHILA SEROGP 1 UR AG: L. pneumophila Serogp 1 Ur Ag: NEGATIVE

## 2016-01-17 NOTE — Progress Notes (Signed)
patient sitting w/o O2 sats 93-94%, with O2 96-98%. Patient ambulating w/o O2 90-91%, with O2 94%

## 2016-01-17 NOTE — Progress Notes (Signed)
PROGRESS NOTE    Mark Harrell  [id number] DOB: Jan 06, 1961 DOA: 01/15/2016 PCP: [name], MD   Chief Complaint  Patient presents with  . Fever  . Cough    Brief Narrative:  HPI On 01/15/2016 by Dr. Tennis Must [name] is a 55 y.o. male with medical history significant of asthma, BPH, dementia, hyperlipidemia, hypertension, male hypogonadism, IBS, obstructive sleep apnea ( not on CPAP) who came to the emergency department due to cough for about a week. Per patient,about a week ago he started having a productive cough of clear, but occasional greenish sputum. He mentions that he thought the Cymbalta that was started earlier this month was worsening his asthma, so he discontinued it after 11 days of treatment last Friday.  Last night, around 0230, he woke up to go to the bathroom. When he returned back to bed, he felt what he describes as a spasm around his trunk and in his extremities associated with chills and rigors, followed by night sweats. He stays that he took his vital signs and the results were VS BP 148/87, HR 127, temp 102.1 F. He also states that he has been having decreased urine volume since last night. He subsequently went to see his PCP around 1130 today,  who subsequently referred him to the emergency department. He states that he felt confused while at the PCPs office.  Assessment & Plan   Sepsis secondary to UTI [facility] -Upon admission, patient was febrile with leukocytosis as well as tachycardia and tachypnea. Also had lactic acid level of 2. -Lactic acid is improving, currently 1.4 -Continues to be febrile -UA: Many bacteria, large leukocytes, TNTC WBC -Urine culture >100K Klebsiella pneumonia -Blood cultures show no growth to date -Continue ceftriaxone  Essential Hypertension -Patient did have soft blood pressure upon admission however currently normal. -Atenolol currently held  Hyperlipidemia -Continue statin  BPH (benign prostatic  hyperplasia) -Continue Uroxatral   Depression, controlled -Continue Zoloft  Memory loss. -Continue Namenda and donezepil.  Mild chronic obstructive pulmonary disease/Asthma/pulmonary HTN -Continue bronchodilators, Allegra, Singulair, Cialis -Currently stable  GERD (gastroesophageal reflux disease) -Continue PPI  OSA (obstructive sleep apnea) -Currently not on CPAP -Continue [place] as needed   Glucose intolerance (impaired glucose tolerance) -Patient denies having diabetes although currently on metformin -Hold metformin, will place on sliding insulin scale CBG monitoring  Polypharmacy -The patient takes multiple prescription medications, vitamins and supplements. -Caution was advised, supplements held while the patient is in the hospital.  DVT Prophylaxis  Lovenox  Code Status: Full  Family Communication: None at bedside  Disposition Plan: Admitted. Possible discharge 01/18/2016 if patient can remain afebrile for 24 hours.  Consultants None  Procedures  None  Antibiotics   Anti-infectives    Start     Dose/Rate Route Frequency Ordered Stop   01/15/16 2030  cefTRIAXone (ROCEPHIN) 2 g in dextrose 5 % 50 mL IVPB     2 g 100 mL/hr over 30 Minutes Intravenous Every 24 hours 01/15/16 2027 01/22/16 2029   01/15/16 2030  azithromycin (ZITHROMAX) 500 mg in dextrose 5 % 250 mL IVPB     500 mg 250 mL/hr over 60 Minutes Intravenous Every 24 hours 01/15/16 2027 01/22/16 2029   01/15/16 1615  vancomycin (VANCOCIN) 2,500 mg in sodium chloride 0.9 % 500 mL IVPB  Status:  Discontinued     2,500 mg 250 mL/hr over 120 Minutes Intravenous  Once 01/15/16 1607 01/15/16 1613   01/15/16 1615  cefTRIAXone (ROCEPHIN) 1 g in dextrose 5 %  50 mL IVPB  Status:  Discontinued     1 g 100 mL/hr over 30 Minutes Intravenous  Once 01/15/16 1613 01/15/16 1618   01/15/16 1615  azithromycin (ZITHROMAX) 500 mg in dextrose 5 % 250 mL IVPB     500 mg 250 mL/hr over 60 Minutes Intravenous  Once  01/15/16 1613 01/15/16 1737   01/15/16 1600  piperacillin-tazobactam (ZOSYN) IVPB 3.375 g  Status:  Discontinued     3.375 g 100 mL/hr over 30 Minutes Intravenous  Once 01/15/16 1559 01/15/16 1639   01/15/16 1600  vancomycin (VANCOCIN) IVPB 1000 mg/200 mL premix  Status:  Discontinued     1,000 mg 200 mL/hr over 60 Minutes Intravenous  Once 01/15/16 1559 01/15/16 1607      Subjective:   [name] seen and examined today.  Patient States he is feeling better, continues to have some shortness of breath and cough.  Currently denies any chest pain, abdominal pain, nausea or vomiting, diarrhea constipation, headache or dizziness.  Objective:   Vitals:   01/16/16 2147 01/17/16 0508 01/17/16 0855 01/17/16 0932  BP:  129/81 130/66   Pulse: 92 (!) 112 97   Resp: 18 19 19    Temp:  100 F (37.8 C) 100.3 F (37.9 C)   TempSrc:  Oral Oral   SpO2: 99% 93% 95% 95%  Weight:      Height:        Intake/Output Summary (Last 24 hours) at 01/17/16 1443 Last data filed at 01/17/16 1438  Gross per 24 hour  Intake          4674.17 ml  Output             5226 ml  Net          -551.83 ml   Filed Weights   01/15/16 1605 01/15/16 2024  Weight: (!) 159.7 kg (352 lb) (!) 170 kg (374 lb 12.5 oz)    Exam  General: Well developed, well nourished, NAD, appears stated age  HEENT: NCAT, mucous membranes moist.   Cardiovascular: S1 S2 auscultated, no murmurs, RRR  Respiratory: Clear breath sounds, dry nonproductive cough.  Abdomen: Soft, obese, nontender, nondistended, + bowel sounds  Extremities: warm dry without cyanosis clubbing or edema  Neuro: AAOx3, nonfocal  Psych: Appropriate mood and affect  Data Reviewed: I have personally reviewed following labs and imaging studies  CBC:  Recent Labs Lab 01/15/16 1341 01/16/16 0612 01/17/16 0741  WBC 19.9* 11.8* 8.5  NEUTROABS 16.4* 8.5* 4.9  HGB 16.0 13.8 13.3  HCT 49.6 43.8 42.2  MCV 92.2 92.6 91.5  PLT 264 216 [id number]   Basic  Metabolic Panel:  Recent Labs Lab 01/15/16 1341 01/16/16 0612 01/17/16 0741  NA 137 138 137  K 4.4 3.7 3.7  CL 100* 104 104  CO2 28 28 25   GLUCOSE 115* 101* 92  BUN 16 14 8   CREATININE 2.21* 1.10 0.75  CALCIUM 9.6 8.7* 8.2*   GFR: Estimated Creatinine Clearance: 170.1 mL/min (by C-G formula based on SCr of 0.8 mg/dL). Liver Function Tests:  Recent Labs Lab 01/15/16 1341 01/16/16 0612 01/17/16 0741  AST 23 19 21   ALT 24 21 24   ALKPHOS 57 44 42  BILITOT 2.1* 1.3* 1.1  PROT 6.8 5.6* 5.7*  ALBUMIN 3.8 3.0* 2.9*   No results for input(s): LIPASE, AMYLASE in the last 168 hours. No results for input(s): AMMONIA in the last 168 hours. Coagulation Profile: No results for input(s): INR, PROTIME in the last 168  hours. Cardiac Enzymes: No results for input(s): CKTOTAL, CKMB, CKMBINDEX, TROPONINI in the last 168 hours. BNP (last 3 results) No results for input(s): PROBNP in the last 8760 hours. HbA1C: No results for input(s): HGBA1C in the last 72 hours. CBG:  Recent Labs Lab 01/16/16 1128 01/16/16 1703 01/16/16 2042 01/17/16 0810 01/17/16 1205  GLUCAP 121* 151* 123* 97 117*   Lipid Profile: No results for input(s): CHOL, HDL, LDLCALC, TRIG, CHOLHDL, LDLDIRECT in the last 72 hours. Thyroid Function Tests: No results for input(s): TSH, T4TOTAL, FREET4, T3FREE, THYROIDAB in the last 72 hours. Anemia Panel: No results for input(s): VITAMINB12, FOLATE, FERRITIN, TIBC, IRON, RETICCTPCT in the last 72 hours. Urine analysis:    Component Value Date/Time   COLORURINE ORANGE (A) 01/15/2016 1625   APPEARANCEUR TURBID (A) 01/15/2016 1625   LABSPEC 1.033 (H) 01/15/2016 1625   PHURINE 5.0 01/15/2016 1625   GLUCOSEU NEGATIVE 01/15/2016 1625   HGBUR MODERATE (A) 01/15/2016 1625   BILIRUBINUR SMALL (A) 01/15/2016 1625   KETONESUR 15 (A) 01/15/2016 1625   PROTEINUR 100 (A) 01/15/2016 1625   NITRITE NEGATIVE 01/15/2016 1625   LEUKOCYTESUR LARGE (A) 01/15/2016 1625   Sepsis  Labs: @LABRCNTIP (procalcitonin:4,lacticidven:4)  ) Recent Results (from the past 240 hour(s))  Culture, blood (Routine x 2)     Status: None (Preliminary result)   Collection Time: 01/15/16  1:40 PM  Result Value Ref Range Status   Specimen Description BLOOD LEFT ANTECUBITAL  Final   Special Requests BOTTLES DRAWN AEROBIC AND ANAEROBIC 5CC  Final   Culture NO GROWTH < 24 HOURS  Final   Report Status PENDING  Incomplete  Culture, blood (Routine x 2)     Status: None (Preliminary result)   Collection Time: 01/15/16  4:06 PM  Result Value Ref Range Status   Specimen Description BLOOD RIGHT FOREARM  Final   Special Requests BOTTLES DRAWN AEROBIC AND ANAEROBIC 5CC  Final   Culture NO GROWTH < 24 HOURS  Final   Report Status PENDING  Incomplete  Urine culture     Status: Abnormal   Collection Time: 01/15/16  4:25 PM  Result Value Ref Range Status   Specimen Description URINE, RANDOM  Final   Special Requests NONE  Final   Culture >=100,000 COLONIES/mL KLEBSIELLA PNEUMONIAE (A)  Final   Report Status 01/17/2016 FINAL  Final   Organism ID, Bacteria KLEBSIELLA PNEUMONIAE (A)  Final      Susceptibility   Klebsiella pneumoniae - MIC*    AMPICILLIN >=32 RESISTANT Resistant     CEFAZOLIN <=4 SENSITIVE Sensitive     CEFTRIAXONE <=1 SENSITIVE Sensitive     CIPROFLOXACIN <=0.25 SENSITIVE Sensitive     GENTAMICIN <=1 SENSITIVE Sensitive     IMIPENEM <=0.25 SENSITIVE Sensitive     NITROFURANTOIN 64 INTERMEDIATE Intermediate     TRIMETH/SULFA <=20 SENSITIVE Sensitive     AMPICILLIN/SULBACTAM 4 SENSITIVE Sensitive     PIP/TAZO <=4 SENSITIVE Sensitive     Extended ESBL NEGATIVE Sensitive     * >=100,000 COLONIES/mL KLEBSIELLA PNEUMONIAE      Radiology Studies: No results found.   Scheduled Meds: . alfuzosin  10 mg Oral Daily  . anastrozole  1 mg Oral Daily  . antiseptic oral rinse  7 mL Mouth Rinse BID  . azelastine  2 spray Each Nare BID  . azithromycin  500 mg Intravenous Q24H  .  cefTRIAXone (ROCEPHIN)  IV  2 g Intravenous Q24H  . cyclobenzaprine  10 mg Oral TID  . darifenacin  7.5 mg Oral Daily  . desmopressin  200 mcg Oral QHS  . donepezil  23 mg Oral QHS  . enoxaparin (LOVENOX) injection  0.5 mg/kg Subcutaneous Q24H  . gabapentin  1,200 mg Oral TID  . memantine  20 mg Oral QHS  . mometasone-formoterol  2 puff Inhalation BID  . montelukast  10 mg Oral Daily  . pantoprazole  40 mg Oral Daily  . pentosan polysulfate  200 mg Oral TID AC  . pravastatin  40 mg Oral QHS  . sertraline  50 mg Oral BID  . sodium chloride  1,000 mL Intravenous Once  . sodium chloride flush  3 mL Intravenous Q12H  . tadalafil  5 mg Oral Daily   Continuous Infusions: . sodium chloride 125 mL/hr at 01/17/16 0504     LOS: 2 days   Time Spent in minutes   30 minutes  Taedyn Glasscock D.O. on 01/17/2016 at 2:43 PM  Between 7am to 7pm - Pager - [phone]  After 7pm go to [email or web address] - password [email or web address]  And look for the night coverage person covering for me after hours  [facility] Office  [phone]

## 2016-01-18 LAB — BASIC METABOLIC PANEL
ANION GAP: 10 (ref 5–15)
BUN: 6 mg/dL (ref 6–20)
CHLORIDE: 101 mmol/L (ref 101–111)
CO2: 24 mmol/L (ref 22–32)
Calcium: 8.3 mg/dL — ABNORMAL LOW (ref 8.9–10.3)
Creatinine, Ser: 0.68 mg/dL (ref 0.61–1.24)
GFR calc Af Amer: >60 mL/min (ref >60)
GLUCOSE: 75 mg/dL (ref 65–99)
POTASSIUM: 3.9 mmol/L (ref 3.5–5.1)
Sodium: 135 mmol/L (ref 135–145)

## 2016-01-18 LAB — CBC
HEMATOCRIT: 42 % (ref 39.0–52.0)
HEMOGLOBIN: 13.3 g/dL (ref 13.0–17.0)
MCH: 29.1 pg (ref 26.0–34.0)
MCHC: 31.7 g/dL (ref 30.0–36.0)
MCV: 91.9 fL (ref 78.0–100.0)
PLATELETS: 201 10*3/uL (ref 150–400)
RBC: 4.57 MIL/uL (ref 4.22–5.81)
RDW: 15.2 % (ref 11.5–15.5)
WBC: 8.9 10*3/uL (ref 4.0–10.5)

## 2016-01-18 LAB — GLUCOSE, CAPILLARY
GLUCOSE-CAPILLARY: 113 mg/dL — AB (ref 65–99)
GLUCOSE-CAPILLARY: 123 mg/dL — AB (ref 65–99)
Glucose-Capillary: 88 mg/dL (ref 65–99)
Glucose-Capillary: 101 mg/dL — ABNORMAL HIGH (ref 65–99)

## 2016-01-18 MED ORDER — FUROSEMIDE 10 MG/ML IJ SOLN
20.0000 mg | Freq: Once | INTRAMUSCULAR | Status: AC
  Administered 2016-01-18: 20 mg via INTRAVENOUS
  Filled 2016-01-18: qty 2

## 2016-01-18 NOTE — Progress Notes (Signed)
PROGRESS NOTE    Mark Harrell  [id number] DOB: January 05, 1961 DOA: 01/15/2016 PCP: [name], MD   Chief Complaint  Patient presents with  . Fever  . Cough    Brief Narrative:  HPI On 01/15/2016 by Dr. Tennis Must [name] is a 55 y.o. male with medical history significant of asthma, BPH, dementia, hyperlipidemia, hypertension, male hypogonadism, IBS, obstructive sleep apnea ( not on CPAP) who came to the emergency department due to cough for about a week. Per patient,about a week ago he started having a productive cough of clear, but occasional greenish sputum. He mentions that he thought the Cymbalta that was started earlier this month was worsening his asthma, so he discontinued it after 11 days of treatment last Friday.  Last night, around 0230, he woke up to go to the bathroom. When he returned back to bed, he felt what he describes as a spasm around his trunk and in his extremities associated with chills and rigors, followed by night sweats. He stays that he took his vital signs and the results were VS BP 148/87, HR 127, temp 102.1 F. He also states that he has been having decreased urine volume since last night. He subsequently went to see his PCP around 1130 today,  who subsequently referred him to the emergency department. He states that he felt confused while at the PCPs office.  Assessment & Plan   Sepsis secondary to UTI [facility]) -Upon admission, patient was febrile with leukocytosis as well as tachycardia and tachypnea. Also had lactic acid level of 2. -Lactic acid is improving, currently 1.4. Leukocytosis improved.  -Continues to be febrile, Fever was upto 100.14F yesterday -UA: Many bacteria, large leukocytes, TNTC WBC -Urine culture >100K Klebsiella pneumonia -Blood cultures show no growth to date -Continue ceftriaxone -Will discontinue IVF as patient feels his legs are tight. Will give one dose of lasix.   Essential Hypertension -Patient did have soft blood  pressure upon admission however currently normal. -Atenolol currently held  Hyperlipidemia -Continue statin  BPH (benign prostatic hyperplasia) -Continue Uroxatral   Depression, controlled -Continue Zoloft  Memory loss. -Continue Namenda and donezepil.  Mild chronic obstructive pulmonary disease/Asthma/pulmonary HTN -Continue bronchodilators, Allegra, Singulair, Cialis -Currently stable  GERD (gastroesophageal reflux disease) -Continue PPI  OSA (obstructive sleep apnea) -Currently not on CPAP -Weaned off O2.   Glucose intolerance (impaired glucose tolerance) -Patient denies having diabetes although currently on metformin -Hold metformin, placed on sliding insulin scale CBG monitoring  Polypharmacy -The patient takes multiple prescription medications, vitamins and supplements. -Caution was advised, supplements held while the patient is in the hospital.  DVT Prophylaxis  Lovenox  Code Status: Full  Family Communication: None at bedside  Disposition Plan: Admitted. Possible discharge 01/19/2016 if patient can remain afebrile for 24 hours.  Consultants None  Procedures  None  Antibiotics   Anti-infectives    Start     Dose/Rate Route Frequency Ordered Stop   01/15/16 2030  cefTRIAXone (ROCEPHIN) 2 g in dextrose 5 % 50 mL IVPB     2 g 100 mL/hr over 30 Minutes Intravenous Every 24 hours 01/15/16 2027 01/22/16 2029   01/15/16 2030  azithromycin (ZITHROMAX) 500 mg in dextrose 5 % 250 mL IVPB     500 mg 250 mL/hr over 60 Minutes Intravenous Every 24 hours 01/15/16 2027 01/22/16 2029   01/15/16 1615  vancomycin (VANCOCIN) 2,500 mg in sodium chloride 0.9 % 500 mL IVPB  Status:  Discontinued     2,500 mg 250  mL/hr over 120 Minutes Intravenous  Once 01/15/16 1607 01/15/16 1613   01/15/16 1615  cefTRIAXone (ROCEPHIN) 1 g in dextrose 5 % 50 mL IVPB  Status:  Discontinued     1 g 100 mL/hr over 30 Minutes Intravenous  Once 01/15/16 1613 01/15/16 1618   01/15/16  1615  azithromycin (ZITHROMAX) 500 mg in dextrose 5 % 250 mL IVPB     500 mg 250 mL/hr over 60 Minutes Intravenous  Once 01/15/16 1613 01/15/16 1737   01/15/16 1600  piperacillin-tazobactam (ZOSYN) IVPB 3.375 g  Status:  Discontinued     3.375 g 100 mL/hr over 30 Minutes Intravenous  Once 01/15/16 1559 01/15/16 1639   01/15/16 1600  vancomycin (VANCOCIN) IVPB 1000 mg/200 mL premix  Status:  Discontinued     1,000 mg 200 mL/hr over 60 Minutes Intravenous  Once 01/15/16 1559 01/15/16 1607      Subjective:   [name] seen and examined today.  Patient denies further shortness of breath.  Feels his legs are getting tight.  Denies any chest pain, abdominal pain, nausea or vomiting, diarrhea constipation, headache or dizziness.  Objective:   Vitals:   01/18/16 0332 01/18/16 0436 01/18/16 0757 01/18/16 0907  BP:  (!) 150/84 136/89   Pulse:  92 82   Resp:  19 20   Temp: 99.9 F (37.7 C) 99.2 F (37.3 C) 98.8 F (37.1 C)   TempSrc: Oral Oral Oral   SpO2:  95% 95% 96%  Weight:      Height:        Intake/Output Summary (Last 24 hours) at 01/18/16 1259 Last data filed at 01/18/16 0909  Gross per 24 hour  Intake             4000 ml  Output             2285 ml  Net             1715 ml   Filed Weights   01/15/16 1605 01/15/16 2024  Weight: (!) 159.7 kg (352 lb) (!) 170 kg (374 lb 12.5 oz)    Exam  General: Well developed, well nourished, no distress  HEENT: NCAT, mucous membranes moist.   Cardiovascular: S1 S2 auscultated, no murmurs, RRR  Respiratory: Clear breath sounds, dry nonproductive cough.  Abdomen: Soft, obese, nontender, nondistended, + bowel sounds  Extremities: warm dry without cyanosis clubbing. Trace LE edema.   Neuro: AAOx3, nonfocal  Psych: Appropriate mood and affect  Data Reviewed: I have personally reviewed following labs and imaging studies  CBC:  Recent Labs Lab 01/15/16 1341 01/16/16 0612 01/17/16 0741 01/18/16 0557  WBC 19.9* 11.8* 8.5  8.9  NEUTROABS 16.4* 8.5* 4.9  --   HGB 16.0 13.8 13.3 13.3  HCT 49.6 43.8 42.2 42.0  MCV 92.2 92.6 91.5 91.9  PLT 264 216 199 [id number]   Basic Metabolic Panel:  Recent Labs Lab 01/15/16 1341 01/16/16 0612 01/17/16 0741 01/18/16 0557  NA 137 138 137 135  K 4.4 3.7 3.7 3.9  CL 100* 104 104 101  CO2 28 28 25 24   GLUCOSE 115* 101* 92 75  BUN 16 14 8 6   CREATININE 2.21* 1.10 0.75 0.68  CALCIUM 9.6 8.7* 8.2* 8.3*   GFR: Estimated Creatinine Clearance: 170.1 mL/min (by C-G formula based on SCr of 0.8 mg/dL). Liver Function Tests:  Recent Labs Lab 01/15/16 1341 01/16/16 0612 01/17/16 0741  AST 23 19 21   ALT 24 21 24   ALKPHOS 57 44 42  BILITOT 2.1* 1.3* 1.1  PROT 6.8 5.6* 5.7*  ALBUMIN 3.8 3.0* 2.9*   No results for input(s): LIPASE, AMYLASE in the last 168 hours. No results for input(s): AMMONIA in the last 168 hours. Coagulation Profile: No results for input(s): INR, PROTIME in the last 168 hours. Cardiac Enzymes: No results for input(s): CKTOTAL, CKMB, CKMBINDEX, TROPONINI in the last 168 hours. BNP (last 3 results) No results for input(s): PROBNP in the last 8760 hours. HbA1C: No results for input(s): HGBA1C in the last 72 hours. CBG:  Recent Labs Lab 01/17/16 1205 01/17/16 1646 01/17/16 2044 01/18/16 0751 01/18/16 1213  GLUCAP 117* 113* 104* 88 101*   Lipid Profile: No results for input(s): CHOL, HDL, LDLCALC, TRIG, CHOLHDL, LDLDIRECT in the last 72 hours. Thyroid Function Tests: No results for input(s): TSH, T4TOTAL, FREET4, T3FREE, THYROIDAB in the last 72 hours. Anemia Panel: No results for input(s): VITAMINB12, FOLATE, FERRITIN, TIBC, IRON, RETICCTPCT in the last 72 hours. Urine analysis:    Component Value Date/Time   COLORURINE ORANGE (A) 01/15/2016 1625   APPEARANCEUR TURBID (A) 01/15/2016 1625   LABSPEC 1.033 (H) 01/15/2016 1625   PHURINE 5.0 01/15/2016 1625   GLUCOSEU NEGATIVE 01/15/2016 1625   HGBUR MODERATE (A) 01/15/2016 1625    BILIRUBINUR SMALL (A) 01/15/2016 1625   KETONESUR 15 (A) 01/15/2016 1625   PROTEINUR 100 (A) 01/15/2016 1625   NITRITE NEGATIVE 01/15/2016 1625   LEUKOCYTESUR LARGE (A) 01/15/2016 1625   Sepsis Labs: @LABRCNTIP (procalcitonin:4,lacticidven:4)  ) Recent Results (from the past 240 hour(s))  Culture, blood (Routine x 2)     Status: None (Preliminary result)   Collection Time: 01/15/16  1:40 PM  Result Value Ref Range Status   Specimen Description BLOOD LEFT ANTECUBITAL  Final   Special Requests BOTTLES DRAWN AEROBIC AND ANAEROBIC 5CC  Final   Culture NO GROWTH 2 DAYS  Final   Report Status PENDING  Incomplete  Culture, blood (Routine x 2)     Status: None (Preliminary result)   Collection Time: 01/15/16  4:06 PM  Result Value Ref Range Status   Specimen Description BLOOD RIGHT FOREARM  Final   Special Requests BOTTLES DRAWN AEROBIC AND ANAEROBIC 5CC  Final   Culture NO GROWTH 2 DAYS  Final   Report Status PENDING  Incomplete  Urine culture     Status: Abnormal   Collection Time: 01/15/16  4:25 PM  Result Value Ref Range Status   Specimen Description URINE, RANDOM  Final   Special Requests NONE  Final   Culture >=100,000 COLONIES/mL KLEBSIELLA PNEUMONIAE (A)  Final   Report Status 01/17/2016 FINAL  Final   Organism ID, Bacteria KLEBSIELLA PNEUMONIAE (A)  Final      Susceptibility   Klebsiella pneumoniae - MIC*    AMPICILLIN >=32 RESISTANT Resistant     CEFAZOLIN <=4 SENSITIVE Sensitive     CEFTRIAXONE <=1 SENSITIVE Sensitive     CIPROFLOXACIN <=0.25 SENSITIVE Sensitive     GENTAMICIN <=1 SENSITIVE Sensitive     IMIPENEM <=0.25 SENSITIVE Sensitive     NITROFURANTOIN 64 INTERMEDIATE Intermediate     TRIMETH/SULFA <=20 SENSITIVE Sensitive     AMPICILLIN/SULBACTAM 4 SENSITIVE Sensitive     PIP/TAZO <=4 SENSITIVE Sensitive     Extended ESBL NEGATIVE Sensitive     * >=100,000 COLONIES/mL KLEBSIELLA PNEUMONIAE  C difficile quick scan w PCR reflex     Status: None   Collection  Time: 01/17/16  3:45 PM  Result Value Ref Range Status   C Diff antigen  NEGATIVE NEGATIVE Final   C Diff toxin NEGATIVE NEGATIVE Final   C Diff interpretation No C. difficile detected.  Final      Radiology Studies: No results found.   Scheduled Meds: . alfuzosin  10 mg Oral Daily  . anastrozole  1 mg Oral Daily  . antiseptic oral rinse  7 mL Mouth Rinse BID  . azelastine  2 spray Each Nare BID  . azithromycin  500 mg Intravenous Q24H  . cefTRIAXone (ROCEPHIN)  IV  2 g Intravenous Q24H  . cyclobenzaprine  10 mg Oral TID  . darifenacin  7.5 mg Oral Daily  . desmopressin  200 mcg Oral QHS  . donepezil  23 mg Oral QHS  . enoxaparin (LOVENOX) injection  0.5 mg/kg Subcutaneous Q24H  . gabapentin  1,200 mg Oral TID  . memantine  20 mg Oral QHS  . mometasone-formoterol  2 puff Inhalation BID  . montelukast  10 mg Oral Daily  . pantoprazole  40 mg Oral Daily  . pentosan polysulfate  200 mg Oral TID AC  . pravastatin  40 mg Oral QHS  . sertraline  50 mg Oral BID  . sodium chloride  1,000 mL Intravenous Once  . sodium chloride flush  3 mL Intravenous Q12H  . tadalafil  5 mg Oral Daily   Continuous Infusions: . sodium chloride Stopped (01/18/16 0807)     LOS: 3 days   Time Spent in minutes   30 minutes  Ulla Mckiernan D.O. on 01/18/2016 at 12:59 PM  Between 7am to 7pm - Pager - [phone]  After 7pm go to [email or web address] - password [email or web address]  And look for the night coverage person covering for me after hours  [facility] Office  [phone]

## 2016-01-19 DIAGNOSIS — J45909 Unspecified asthma, uncomplicated: Secondary | ICD-10-CM

## 2016-01-19 LAB — BASIC METABOLIC PANEL
Anion gap: 9 (ref 5–15)
BUN: 6 mg/dL (ref 6–20)
CALCIUM: 9 mg/dL (ref 8.9–10.3)
CO2: 32 mmol/L (ref 22–32)
CREATININE: 0.8 mg/dL (ref 0.61–1.24)
Chloride: 100 mmol/L — ABNORMAL LOW (ref 101–111)
GFR calc Af Amer: >60 mL/min (ref >60)
Glucose, Bld: 97 mg/dL (ref 65–99)
Potassium: 3.7 mmol/L (ref 3.5–5.1)
SODIUM: 141 mmol/L (ref 135–145)

## 2016-01-19 LAB — GLUCOSE, CAPILLARY
Glucose-Capillary: 90 mg/dL (ref 65–99)
Glucose-Capillary: 89 mg/dL (ref 65–99)

## 2016-01-19 MED ORDER — AMOXICILLIN-POT CLAVULANATE 875-125 MG PO TABS: 1.0000 | ORAL_TABLET | Freq: Two times a day (BID) | ORAL | 0 refills | Status: DC

## 2016-01-19 MED ORDER — FUROSEMIDE 20 MG PO TABS: 20.0000 mg | ORAL_TABLET | Freq: Every day | ORAL | 0 refills | Status: DC

## 2016-01-19 NOTE — Progress Notes (Addendum)
Discussed discharge instructions and medications with patient. All questions answered. Medications sent home with patient (nasal spray and inhaler).

## 2016-01-19 NOTE — Discharge Summary (Signed)
Physician Discharge Summary  Mark Harrell [id number] DOB: 1960/09/08 DOA: 01/15/2016  PCP: [name], MD  Admit date: 01/15/2016 Discharge date: 01/19/2016  Time spent: 45 minutes  Recommendations for Outpatient Follow-up:  Patient will be discharged to home.  Patient will need to follow up with primary care provider within one week of discharge, repeat BMP.  Patient should continue medications as prescribed.  Patient should follow a heart healthy/carb modified diet.   Discharge Diagnoses:  Principal Problem:   Sepsis secondary to UTI [facility]) Active Problems:   Hypertension   Hyperlipidemia   BPH (benign prostatic hyperplasia)   Depression, controlled   Mild chronic obstructive pulmonary disease (HCC)   GERD (gastroesophageal reflux disease)   Asthma   OSA (obstructive sleep apnea)   Glucose intolerance (impaired glucose tolerance)   Polypharmacy   Discharge Condition: Stable  Diet recommendation: Heart healthy/carb modifed  Filed Weights   01/15/16 1605 01/15/16 2024  Weight: (!) 159.7 kg (352 lb) (!) 170 kg (374 lb 12.5 oz)    History of present illness:  On 01/15/2016 by Dr. Tamela Gammon Irvinis a 55 y.o.malewith medical history significant of asthma, BPH, dementia, hyperlipidemia, hypertension, male hypogonadism, IBS, obstructive sleep apnea ( not on CPAP) who came to the emergency department due to cough for about a week. Per patient,about a week ago he started having a productive cough of clear, but occasional greenish sputum. He mentions that he thought the Cymbalta that was started earlier this month was worsening his asthma, so he discontinued it after 11 days of treatment last Friday.  Last night, around 0230, he woke up to go to the bathroom. When he returned back to bed, he felt what he describes as a spasm around his trunk and in his extremities associated with chills and rigors, followed by night sweats. He stays that he took his vital signs  and the results were VS BP 148/87, HR 127, temp 102.1 F. He also states that he has been having decreased urine volume since last night. He subsequently went to see his PCP around 1130 today, who subsequently referred him to the emergency department. He states that he felt confused while at the PCPs office.  Hospital Course:  Sepsis secondary to UTI [facility]) -Upon admission, patient was febrile with leukocytosis as well as tachycardia and tachypnea. Also had lactic acid level of 2. -Lactic acid is improving, currently 1.4. Leukocytosis improved.  -Continues to be febrile, Fever was upto 100.58F yesterday -UA: Many bacteria, large leukocytes, TNTC WBC -Urine culture >100K Klebsiella pneumonia -Blood cultures show no growth to date -Was placed ceftriaxone, will discharge with Augmentin -Discontinued IVF as patient feels his legs were getting tight. Given lasix with improvement. Will given small dose of lasix to use over the next few days. Follow up with PCP, repeat BMP in one week.   Essential Hypertension -Patient did have soft blood pressure upon admission however currently normal. -Atenolol currently held  Hyperlipidemia -Continue statin  BPH (benign prostatic hyperplasia) -Continue Uroxatral   Depression, controlled -Continue Zoloft  Memory loss. -Continue Namenda and donezepil.  Mild chronic obstructive pulmonary disease/Asthma/pulmonary HTN with possible bronchitis -Continue bronchodilators, Allegra, Singulair, Cialis -Currently stable -Was placed on azithromycin for possible bronchitis. Lung clear on exam, mild cough. CXR showed no pneumonia.  GERD (gastroesophageal reflux disease) -Continue PPI  OSA (obstructive sleep apnea) -Currently not on CPAP -Weaned off O2.   Glucose intolerance (impaired glucose tolerance) -Patient denies having diabetes although currently on metformin -Held metformin, placed  on sliding insulin scale CBG monitoring during  hospitalization -Continue metformin upon discharge  Polypharmacy -The patient takes multiple prescription medications, vitamins and supplements. -Caution was advised, supplements held while the patient is in the hospital.  Right forearm Extravasation  -Likely due to IV  -Patient has small area of erythema and blistering noted -Discussed wound care with patient. -Follow up with PCP   Consultants None  Procedures  None  Discharge Exam: Vitals:   01/19/16 0458 01/19/16 0838  BP: (!) 118/59 138/81  Pulse: 68 88  Resp: 20 18  Temp: 98.7 F (37.1 C) 98.9 F (37.2 C)   Exam  General: Well developed, well nourished, no distress  HEENT: NCAT, mucous membranes moist.   Cardiovascular: S1 S2 auscultated, no murmurs, RRR  Respiratory: Clear breath sounds, occ cough  Abdomen: Soft, obese, nontender, nondistended, + bowel sounds  Extremities: warm dry without cyanosis clubbing. Trace LE edema.   Neuro: AAOx3, nonfocal  Skin: small area of erythema, +blistering on right forearm, appears to be due to infiltration    Psych: Appropriate mood and affect, pleasant  Discharge Instructions Discharge Instructions    Discharge instructions    Complete by:  As directed   Patient will be discharged to home.  Patient will need to follow up with primary care provider within one week of discharge.  Patient should continue medications as prescribed.  Patient should follow a heart healthy/carb modified diet.     Current Discharge Medication List    START taking these medications   Details  amoxicillin-clavulanate (AUGMENTIN) 875-125 MG tablet Take 1 tablet by mouth 2 (two) times daily. Qty: 12 tablet, Refills: 0    furosemide (LASIX) 20 MG tablet Take 1 tablet (20 mg total) by mouth daily. Qty: 5 tablet, Refills: 0      CONTINUE these medications which have NOT CHANGED   Details  albuterol (PROAIR HFA) 108 (90 Base) MCG/ACT inhaler INHALE 2 PUFFS INTO THE LUNGS EVERY 4 (FOUR)  HOURS AS NEEDED FOR WHEEZING OR SHORTNESS OF BREATH. Qty: 8.5 each, Refills: 3    alfuzosin (UROXATRAL) 10 MG 24 hr tablet Take 10 mg by mouth daily.      anastrozole (ARIMIDEX) 1 MG tablet Take 1 mg by mouth daily. Refills: 3    atenolol (TENORMIN) 100 MG tablet TAKE 1 TABLET BY MOUTH EVERY DAY Qty: 90 tablet, Refills: 3    azelastine (ASTELIN) 0.1 % nasal spray Place 2 sprays into both nostrils 2 (two) times daily. Qty: 30 mL, Refills: 12   Associated Diagnoses: Allergic rhinitis, unspecified allergic rhinitis type    Black Cohosh 160 MG CAPS Take 160 mg by mouth daily.    Calcium Carbonate-Vitamin D (CALCIUM + D PO) Take 500 mg by mouth 2 (two) times daily.     cetirizine (ZYRTEC) 10 MG tablet Take 10 mg by mouth daily.    Cholecalciferol (VITAMIN D PO) Take 5,000 Units by mouth 4 (four) times daily.    Cinnamon 500 MG capsule Take 500 mg by mouth daily.    co-enzyme Q-10 30 MG capsule Take 30 mg by mouth 3 (three) times daily.    cyclobenzaprine (FLEXERIL) 10 MG tablet Take 10 mg by mouth 3 (three) times daily as needed for muscle spasms.     desmopressin (DDAVP) 0.2 MG tablet Take 200 mcg by mouth at bedtime. Refills: 3    donepezil (ARICEPT) 23 MG TABS tablet Take 23 mg by mouth at bedtime.    Eszopiclone (ESZOPICLONE) 3 MG TABS Take  3 mg by mouth at bedtime.     Flaxseed, Linseed, (FLAX SEED OIL) 1000 MG CAPS Take 1,000 mg by mouth daily.    gabapentin (NEURONTIN) 600 MG tablet 1,200 mg 3 (three) times daily.     Garlic 123XX123 MG TABS Take 100 mg by mouth daily.    glucosamine-chondroitin 500-400 MG tablet Take 1 tablet by mouth 3 (three) times daily.    L-METHYLFOLATE CALCIUM PO Take 1 tablet by mouth daily.     Lecithin 1200 MG CAPS Take 1 capsule by mouth daily.     lidocaine (LIDODERM) 5 % Place 1 patch onto the skin daily. Remove & Discard patch within 12 hours or as directed by MD    memantine (NAMENDA) 10 MG tablet Take 20 mg by mouth daily.       metFORMIN (GLUCOPHAGE XR) 500 MG 24 hr tablet Take one tablet twice daily with breakfast and dinner. Qty: 180 tablet, Refills: 11    Methylfol-Algae-B12-Acetylcyst (CEREFOLIN NAC) 6-90.314-2-600 MG TABS Take 1 tablet by mouth daily.     mirabegron ER (MYRBETRIQ) 50 MG TB24 tablet Take 50 mg by mouth daily.     mometasone-formoterol (DULERA) 200-5 MCG/ACT AERO Inhale 2 puffs into the lungs 2 (two) times daily. Qty: 13 g, Refills: 3    montelukast (SINGULAIR) 10 MG tablet Take 1 tablet (10 mg total) by mouth daily. Qty: 90 tablet, Refills: 3    Multiple Vitamins-Minerals (MULTIVITAMIN WITH MINERALS) tablet Take 1 tablet by mouth daily.    multivitamin-lutein (OCUVITE-LUTEIN) CAPS capsule Take 1 capsule by mouth daily.    NEXIUM 20 MG capsule TAKE 1 CAPSULE (20 MG TOTAL) BY MOUTH DAILY AT 12 NOON. Qty: 90 capsule, Refills: 1    oxyCODONE-acetaminophen (PERCOCET) 10-325 MG tablet Take 1 tablet by mouth every 4 (four) hours as needed for pain.    pentosan polysulfate (ELMIRON) 100 MG capsule Take 200 mg by mouth 3 (three) times daily before meals.     Phosphatidylserine-DHA-EPA (VAYACOG) 100-19.5-6.5 MG CAPS Take 1 capsule by mouth daily.     pravastatin (PRAVACHOL) 40 MG tablet TAKE 1 TABLET BY MOUTH AT BEDTIME Qty: 90 tablet, Refills: 2    ramelteon (ROZEREM) 8 MG tablet Take 8 mg by mouth.    sertraline (ZOLOFT) 50 MG tablet Take 50 mg by mouth 2 (two) times daily.     tadalafil (CIALIS) 5 MG tablet Take 5 mg by mouth.    trospium (SANCTURA) 20 MG tablet 20 mg 2 (two) times daily.  Refills: 2    zinc gluconate 50 MG tablet Take 50 mg by mouth daily.    zolpidem (AMBIEN) 10 MG tablet Take 10 mg by mouth.    famotidine (PEPCID) 20 MG tablet Take 1 tablet (20 mg total) by mouth 2 (two) times daily. Qty: 30 tablet, Refills: 0    triamcinolone cream (KENALOG) 0.1 % Apply 1 application topically 4 (four) times daily as needed. Qty: 30 g, Refills: 0       Allergies   Allergen Reactions  . Bee Venom Swelling  . Cymbalta [Duloxetine Hcl]   . Fenofibrate     Back pain  . Ppd [Tuberculin Purified Protein Derivative]     +ppd NEG Quantferron Gold 3/13  . Verapamil    Follow-up Information    MCKEOWN,WILLIAM DAVID, MD. Schedule an appointment as soon as possible for a visit in 1 week(s).   Specialty:  Internal Medicine Why:  Hospital follow up, repeat BMP Contact information: [address]  [place] [address] [phone]            The results of significant diagnostics from this hospitalization (including imaging, microbiology, ancillary and laboratory) are listed below for reference.    Significant Diagnostic Studies: Dg Chest 2 View  Result Date: 01/15/2016 CLINICAL DATA:  Cough. EXAM: CHEST  2 VIEW COMPARISON:  Radiographs of March 21, 2014. FINDINGS: The heart size and mediastinal contours are within normal limits. Both lungs are clear. No pneumothorax or pleural effusion is noted. The visualized skeletal structures are unremarkable. IMPRESSION: No active cardiopulmonary disease. Electronically Signed   By: Marijo Conception, M.D.   On: 01/15/2016 14:46    Microbiology: Recent Results (from the past 240 hour(s))  Culture, blood (Routine x 2)     Status: None (Preliminary result)   Collection Time: 01/15/16  1:40 PM  Result Value Ref Range Status   Specimen Description BLOOD LEFT ANTECUBITAL  Final   Special Requests BOTTLES DRAWN AEROBIC AND ANAEROBIC 5CC  Final   Culture NO GROWTH 3 DAYS  Final   Report Status PENDING  Incomplete  Culture, blood (Routine x 2)     Status: None (Preliminary result)   Collection Time: 01/15/16  4:06 PM  Result Value Ref Range Status   Specimen Description BLOOD RIGHT FOREARM  Final   Special Requests BOTTLES DRAWN AEROBIC AND ANAEROBIC 5CC  Final   Culture NO GROWTH 3 DAYS  Final   Report Status PENDING  Incomplete  Urine culture     Status: Abnormal   Collection Time: 01/15/16   4:25 PM  Result Value Ref Range Status   Specimen Description URINE, RANDOM  Final   Special Requests NONE  Final   Culture >=100,000 COLONIES/mL KLEBSIELLA PNEUMONIAE (A)  Final   Report Status 01/17/2016 FINAL  Final   Organism ID, Bacteria KLEBSIELLA PNEUMONIAE (A)  Final      Susceptibility   Klebsiella pneumoniae - MIC*    AMPICILLIN >=32 RESISTANT Resistant     CEFAZOLIN <=4 SENSITIVE Sensitive     CEFTRIAXONE <=1 SENSITIVE Sensitive     CIPROFLOXACIN <=0.25 SENSITIVE Sensitive     GENTAMICIN <=1 SENSITIVE Sensitive     IMIPENEM <=0.25 SENSITIVE Sensitive     NITROFURANTOIN 64 INTERMEDIATE Intermediate     TRIMETH/SULFA <=20 SENSITIVE Sensitive     AMPICILLIN/SULBACTAM 4 SENSITIVE Sensitive     PIP/TAZO <=4 SENSITIVE Sensitive     Extended ESBL NEGATIVE Sensitive     * >=100,000 COLONIES/mL KLEBSIELLA PNEUMONIAE  C difficile quick scan w PCR reflex     Status: None   Collection Time: 01/17/16  3:45 PM  Result Value Ref Range Status   C Diff antigen NEGATIVE NEGATIVE Final   C Diff toxin NEGATIVE NEGATIVE Final   C Diff interpretation No C. difficile detected.  Final     Labs: Basic Metabolic Panel:  Recent Labs Lab 01/15/16 1341 01/16/16 0612 01/17/16 0741 01/18/16 0557 01/19/16 0552  NA 137 138 137 135 141  K 4.4 3.7 3.7 3.9 3.7  CL 100* 104 104 101 100*  CO2 28 28 25 24  32  GLUCOSE 115* 101* 92 75 97  BUN 16 14 8 6 6   CREATININE 2.21* 1.10 0.75 0.68 0.80  CALCIUM 9.6 8.7* 8.2* 8.3* 9.0   Liver Function Tests:  Recent Labs Lab 01/15/16 1341 01/16/16 0612 01/17/16 0741  AST 23 19 21   ALT 24 21 24   ALKPHOS 57 44 42  BILITOT 2.1* 1.3* 1.1  PROT 6.8 5.6*  5.7*  ALBUMIN 3.8 3.0* 2.9*   No results for input(s): LIPASE, AMYLASE in the last 168 hours. No results for input(s): AMMONIA in the last 168 hours. CBC:  Recent Labs Lab 01/15/16 1341 01/16/16 0612 01/17/16 0741 01/18/16 0557  WBC 19.9* 11.8* 8.5 8.9  NEUTROABS 16.4* 8.5* 4.9  --   HGB  16.0 13.8 13.3 13.3  HCT 49.6 43.8 42.2 42.0  MCV 92.2 92.6 91.5 91.9  PLT 264 216 199 201   Cardiac Enzymes: No results for input(s): CKTOTAL, CKMB, CKMBINDEX, TROPONINI in the last 168 hours. BNP: BNP (last 3 results) No results for input(s): BNP in the last 8760 hours.  ProBNP (last 3 results) No results for input(s): PROBNP in the last 8760 hours.  CBG:  Recent Labs Lab 01/18/16 0751 01/18/16 1213 01/18/16 1610 01/18/16 2102 01/19/16 0836  GLUCAP 88 101* 113* 123* 90       Signed:  Peni Rupard  [facility] 01/19/2016, 10:26 AM

## 2016-01-19 NOTE — Discharge Instructions (Signed)
Sepsis, Adult Sepsis is a serious infection of your blood or tissues that affects your whole body. The infection that causes sepsis may be bacterial, viral, fungal, or parasitic. Sepsis may be life threatening. Sepsis can cause your blood pressure to drop. This may result in shock. Shock causes your central nervous system and your organs to stop working correctly.  RISK FACTORS Sepsis can happen in anyone, but it is more likely to happen in people who have weakened immune systems. SIGNS AND SYMPTOMS  Symptoms of sepsis can include:  Fever or low body temperature (hypothermia).  Rapid breathing (hyperventilation).  Chills.  Rapid heartbeat (tachycardia).  Confusion or light-headedness.  Trouble breathing.  Urinating much less than usual.  Cool, clammy skin or red, flushed skin.  Other problems with the heart, kidneys, or brain. DIAGNOSIS  Your health care provider will likely do tests to look for an infection, to see if the infection has spread to your blood, and to see how serious your condition is. Tests can include:  Blood tests, including cultures of your blood.  Cultures of other fluids from your body, such as:  Urine.  Pus from wounds.  Mucus coughed up from your lungs.  Urine tests other than cultures.  X-ray exams or other imaging tests. TREATMENT  Treatment will begin with elimination of the source of infection. If your sepsis is likely caused by a bacterial or fungal infection, you will be given antibiotic or antifungal medicines. You may also receive:  Oxygen.  Fluids through an IV tube.  Medicines to increase your blood pressure.  A machine to clean your blood (dialysis) if your kidneys fail.  A machine to help you breathe if your lungs fail. SEEK IMMEDIATE MEDICAL CARE IF: You get an infection or develop any of the signs and symptoms of sepsis after surgery or a hospitalization.   This information is not intended to replace advice given to you by  your health care provider. Make sure you discuss any questions you have with your health care provider.   Document Released: 02/08/2003 Document Revised: 09/26/2014 Document Reviewed: 01/17/2013 Elsevier Interactive Patient Education 2016 [employer]  Urinary Tract Infection Urinary tract infections (UTIs) can develop anywhere along your urinary tract. Your urinary tract is your body's drainage system for removing wastes and extra water. Your urinary tract includes two kidneys, two ureters, a bladder, and a urethra. Your kidneys are a pair of bean-shaped organs. Each kidney is about the size of your fist. They are located below your ribs, one on each side of your spine. CAUSES Infections are caused by microbes, which are microscopic organisms, including fungi, viruses, and bacteria. These organisms are so small that they can only be seen through a microscope. Bacteria are the microbes that most commonly cause UTIs. SYMPTOMS  Symptoms of UTIs may vary by age and gender of the patient and by the location of the infection. Symptoms in young women typically include a frequent and intense urge to urinate and a painful, burning feeling in the bladder or urethra during urination. Older women and men are more likely to be tired, shaky, and weak and have muscle aches and abdominal pain. A fever may mean the infection is in your kidneys. Other symptoms of a kidney infection include pain in your back or sides below the ribs, nausea, and vomiting. DIAGNOSIS To diagnose a UTI, your caregiver will ask you about your symptoms. Your caregiver will also ask you to provide a urine sample. The urine sample will be  tested for bacteria and white blood cells. White blood cells are made by your body to help fight infection. TREATMENT  Typically, UTIs can be treated with medication. Because most UTIs are caused by a bacterial infection, they usually can be treated with the use of antibiotics. The choice of antibiotic and  length of treatment depend on your symptoms and the type of bacteria causing your infection. HOME CARE INSTRUCTIONS  If you were prescribed antibiotics, take them exactly as your caregiver instructs you. Finish the medication even if you feel better after you have only taken some of the medication.  Drink enough water and fluids to keep your urine clear or pale yellow.  Avoid caffeine, tea, and carbonated beverages. They tend to irritate your bladder.  Empty your bladder often. Avoid holding urine for long periods of time.  Empty your bladder before and after sexual intercourse.  After a bowel movement, women should cleanse from front to back. Use each tissue only once. SEEK MEDICAL CARE IF:   You have back pain.  You develop a fever.  Your symptoms do not begin to resolve within 3 days. SEEK IMMEDIATE MEDICAL CARE IF:   You have severe back pain or lower abdominal pain.  You develop chills.  You have nausea or vomiting.  You have continued burning or discomfort with urination. MAKE SURE YOU:   Understand these instructions.  Will watch your condition.  Will get help right away if you are not doing well or get worse.   This information is not intended to replace advice given to you by your health care provider. Make sure you discuss any questions you have with your health care provider.   Document Released: 02/19/2005 Document Revised: 01/31/2015 Document Reviewed: 06/20/2011 Elsevier Interactive Patient Education [employer].

## 2016-01-20 LAB — CULTURE, BLOOD (ROUTINE X 2)
CULTURE: NO GROWTH
CULTURE: NO GROWTH

## 2016-01-22 ENCOUNTER — Ambulatory Visit (INDEPENDENT_AMBULATORY_CARE_PROVIDER_SITE_OTHER): Payer: BLUE CROSS/BLUE SHIELD | Admitting: Internal Medicine

## 2016-01-22 ENCOUNTER — Encounter: Payer: Self-pay | Admitting: Internal Medicine

## 2016-01-22 VITALS — BP 110/62 | HR 80 | Temp 98.0°F | Resp 16

## 2016-01-22 DIAGNOSIS — A419 Sepsis, unspecified organism: Secondary | ICD-10-CM | POA: Diagnosis not present

## 2016-01-22 DIAGNOSIS — I868 Varicose veins of other specified sites: Secondary | ICD-10-CM | POA: Diagnosis not present

## 2016-01-22 DIAGNOSIS — M25561 Pain in right knee: Secondary | ICD-10-CM | POA: Diagnosis not present

## 2016-01-22 DIAGNOSIS — I8390 Asymptomatic varicose veins of unspecified lower extremity: Secondary | ICD-10-CM

## 2016-01-22 DIAGNOSIS — M25562 Pain in left knee: Secondary | ICD-10-CM

## 2016-01-22 DIAGNOSIS — J449 Chronic obstructive pulmonary disease, unspecified: Secondary | ICD-10-CM | POA: Diagnosis not present

## 2016-01-22 MED ORDER — TRIAMCINOLONE ACETONIDE 0.1 % EX CREA: 1.0000 "application " | TOPICAL_CREAM | Freq: Four times a day (QID) | CUTANEOUS | 0 refills | Status: DC | PRN

## 2016-01-22 MED ORDER — PREDNISONE 20 MG PO TABS: ORAL_TABLET | ORAL | 0 refills | Status: DC

## 2016-01-22 NOTE — Patient Instructions (Addendum)
Please finish the augmentin until it is gone.  Please take the prednisone as directed until it is gone.  Please continue your inhalers.    Please use your rescue inhaler every 6 hours until your cough subsides.  Please take your cough syrup as needed.  Please use kenalog cream on the blisters on your arm.  Please keep the blisters clean and dry.    Please use tommy copper or dr. Karie Georges brand compression socks available over the counter.

## 2016-01-22 NOTE — Progress Notes (Signed)
Patient ID: Mark Harrell, male   DOB: [birth year], 55 y.o.   MRN: [id number]  Assessment and Plan: [phone] (all) [phone] (if 7 days high complexity)  hospital visit follow up for Urosepsis.  He is currently on Augmentin.  Too soon to recheck urine.  Blood cultures negative.  He is covered for pneumonia given increased coughing and wheezing will put on prednisone taper.  He does have cough syrup at home he can use.  Patient to be given kenalog ointment for blistering on rash which is either localized reaction to adhesives from tegaderm or from IV azithromycin.    Over 40 minutes of exam, counseling, chart review, and complex, moderate level critical decision making was performed this visit.   HPI 55 y.o.male presents for follow up from the hospital. Admit date to the hospital was 01/15/16, patient was discharged from the hospital on 01/19/16 and our office contacted the office the day after discharge to set up a follow up appointment, patient was admitted for: Urosepsis.  He was found to febrile in the ER with mildly elevated lactic acidosis.  This improved with hydration.  He was found to have UTI with klebsiella pneumnoia.  Blood cultures were negative.  He was discharged home with Augmentin after having IV ceftriaxone.  Patient also notes that he has had some blistering of the right forearm which was thought to be secondary to IV use.  The blisters are getting bigger. He reports that he has been keeping it clean and dry.  He was also placed on lasix secondary to edema from IV fluids.  He has 1 day left of lasix today.  Blood cultures were negative.    Images while in the hospital: Dg Chest 2 View  Result Date: 01/15/2016 CLINICAL DATA:  Cough. EXAM: CHEST  2 VIEW COMPARISON:  Radiographs of March 21, 2014. FINDINGS: The heart size and mediastinal contours are within normal limits. Both lungs are clear. No pneumothorax or pleural effusion is noted. The visualized skeletal structures are unremarkable.  IMPRESSION: No active cardiopulmonary disease. Electronically Signed   By: Marijo Conception, M.D.   On: 01/15/2016 14:46    Past Medical History:  Diagnosis Date  . Anesthesia complication requiring reversal agent administration    ? from central apnea, very difficult to get off vent  . Asthma   . BPH (benign prostatic hyperplasia)   . Dementia   . Hyperlipidemia   . Hypertension   . Hypogonadism male   . IBS (irritable bowel syndrome)   . OSA (obstructive sleep apnea)   . Prostatitis      Allergies  Allergen Reactions  . Bee Venom Swelling  . Cymbalta [Duloxetine Hcl]   . Fenofibrate     Back pain  . Ppd [Tuberculin Purified Protein Derivative]     +ppd NEG Quantferron Gold 3/13  . Verapamil       Current Outpatient Prescriptions on File Prior to Visit  Medication Sig Dispense Refill  . albuterol (PROAIR HFA) 108 (90 Base) MCG/ACT inhaler INHALE 2 PUFFS INTO THE LUNGS EVERY 4 (FOUR) HOURS AS NEEDED FOR WHEEZING OR SHORTNESS OF BREATH. 8.5 each 3  . alfuzosin (UROXATRAL) 10 MG 24 hr tablet Take 10 mg by mouth daily.      [name] amoxicillin-clavulanate (AUGMENTIN) 875-125 MG tablet Take 1 tablet by mouth 2 (two) times daily. 12 tablet 0  . anastrozole (ARIMIDEX) 1 MG tablet Take 1 mg by mouth daily.  3  . atenolol (TENORMIN) 100 MG tablet TAKE  1 TABLET BY MOUTH EVERY DAY 90 tablet 3  . azelastine (ASTELIN) 0.1 % nasal spray Place 2 sprays into both nostrils 2 (two) times daily. 30 mL 12  . Black Cohosh 160 MG CAPS Take 160 mg by mouth daily.    . Calcium Carbonate-Vitamin D (CALCIUM + D PO) Take 500 mg by mouth 2 (two) times daily.     . cetirizine (ZYRTEC) 10 MG tablet Take 10 mg by mouth daily.    . Cholecalciferol (VITAMIN D PO) Take 5,000 Units by mouth 4 (four) times daily.    . Cinnamon 500 MG capsule Take 500 mg by mouth daily.    [name] co-enzyme Q-10 30 MG capsule Take 30 mg by mouth 3 (three) times daily.    . cyclobenzaprine (FLEXERIL) 10 MG tablet Take 10 mg by mouth 3  (three) times daily as needed for muscle spasms.     [name] desmopressin (DDAVP) 0.2 MG tablet Take 200 mcg by mouth at bedtime.  3  . donepezil (ARICEPT) 23 MG TABS tablet Take 23 mg by mouth at bedtime.    . Eszopiclone (ESZOPICLONE) 3 MG TABS Take 3 mg by mouth at bedtime.     . famotidine (PEPCID) 20 MG tablet Take 1 tablet (20 mg total) by mouth 2 (two) times daily. 30 tablet 0  . Flaxseed, Linseed, (FLAX SEED OIL) 1000 MG CAPS Take 1,000 mg by mouth daily.    . furosemide (LASIX) 20 MG tablet Take 1 tablet (20 mg total) by mouth daily. 5 tablet 0  . gabapentin (NEURONTIN) 600 MG tablet 1,200 mg 3 (three) times daily.     . Garlic 123XX123 MG TABS Take 100 mg by mouth daily.    [name] glucosamine-chondroitin 500-400 MG tablet Take 1 tablet by mouth 3 (three) times daily.    . L-METHYLFOLATE CALCIUM PO Take 1 tablet by mouth daily.     . Lecithin 1200 MG CAPS Take 1 capsule by mouth daily.     [name] lidocaine (LIDODERM) 5 % Place 1 patch onto the skin daily. Remove & Discard patch within 12 hours or as directed by MD    . memantine (NAMENDA) 10 MG tablet Take 20 mg by mouth daily.     . metFORMIN (GLUCOPHAGE XR) 500 MG 24 hr tablet Take one tablet twice daily with breakfast and dinner. 180 tablet 11  . Methylfol-Algae-B12-Acetylcyst (CEREFOLIN NAC) 6-90.314-2-600 MG TABS Take 1 tablet by mouth daily.     . mirabegron ER (MYRBETRIQ) 50 MG TB24 tablet Take 50 mg by mouth daily.     . mometasone-formoterol (DULERA) 200-5 MCG/ACT AERO Inhale 2 puffs into the lungs 2 (two) times daily. 13 g 3  . montelukast (SINGULAIR) 10 MG tablet Take 1 tablet (10 mg total) by mouth daily. 90 tablet 3  . Multiple Vitamins-Minerals (MULTIVITAMIN WITH MINERALS) tablet Take 1 tablet by mouth daily.    . multivitamin-lutein (OCUVITE-LUTEIN) CAPS capsule Take 1 capsule by mouth daily.    [name] NEXIUM 20 MG capsule TAKE 1 CAPSULE (20 MG TOTAL) BY MOUTH DAILY AT 12 NOON. 90 capsule 1  . oxyCODONE-acetaminophen (PERCOCET) 10-325 MG tablet  Take 1 tablet by mouth every 4 (four) hours as needed for pain.    . pentosan polysulfate (ELMIRON) 100 MG capsule Take 200 mg by mouth 3 (three) times daily before meals.     . Phosphatidylserine-DHA-EPA (VAYACOG) 100-19.5-6.5 MG CAPS Take 1 capsule by mouth daily.     . pravastatin (PRAVACHOL) 40 MG tablet TAKE 1  TABLET BY MOUTH AT BEDTIME 90 tablet 2  . ramelteon (ROZEREM) 8 MG tablet Take 8 mg by mouth.    . sertraline (ZOLOFT) 50 MG tablet Take 50 mg by mouth 2 (two) times daily.     . tadalafil (CIALIS) 5 MG tablet Take 5 mg by mouth.    . triamcinolone cream (KENALOG) 0.1 % Apply 1 application topically 4 (four) times daily as needed. (Patient not taking: Reported on 01/15/2016) 30 g 0  . trospium (SANCTURA) 20 MG tablet 20 mg 2 (two) times daily.   2  . zinc gluconate 50 MG tablet Take 50 mg by mouth daily.    [name] zolpidem (AMBIEN) 10 MG tablet Take 10 mg by mouth.     No current facility-administered medications on file prior to visit.     ROS: all negative except above.   Physical Exam: There were no vitals filed for this visit. There were no vitals taken for this visit. General Appearance: Well nourished, in no apparent distress. Eyes: PERRLA, EOMs, conjunctiva no swelling or erythema Sinuses: No Frontal/maxillary tenderness ENT/Mouth: Ext aud canals clear, TMs without erythema, bulging. No erythema, swelling, or exudate on post pharynx.  Tonsils not swollen or erythematous. Hearing normal.  Neck: Supple, thyroid normal.  Respiratory: Respiratory effort normal, BS equal bilaterally without rales, rhonchi, wheezing or stridor.  Cardio: RRR with no MRGs. Brisk peripheral pulses without edema.  Abdomen: Soft, + BS.  Non tender, no guarding, rebound, hernias, masses. Lymphatics: Non tender without lymphadenopathy.  Musculoskeletal: Full ROM, 5/5 strength, normal gait.  Skin: Warm, dry without rashes, lesions, ecchymosis.  Neuro: Cranial nerves intact. Normal muscle tone, no  cerebellar symptoms. Sensation intact.  Psych: Awake and oriented X 3, normal affect, Insight and Judgment appropriate.     Starlyn Skeans, PA-C 2:19 PM [facility]

## 2016-01-25 ENCOUNTER — Other Ambulatory Visit: Payer: Self-pay | Admitting: Internal Medicine

## 2016-01-30 DIAGNOSIS — M25562 Pain in left knee: Secondary | ICD-10-CM | POA: Diagnosis not present

## 2016-02-05 ENCOUNTER — Other Ambulatory Visit: Payer: BLUE CROSS/BLUE SHIELD

## 2016-02-05 DIAGNOSIS — A419 Sepsis, unspecified organism: Secondary | ICD-10-CM | POA: Diagnosis not present

## 2016-02-05 DIAGNOSIS — N3000 Acute cystitis without hematuria: Secondary | ICD-10-CM

## 2016-02-06 LAB — URINALYSIS, ROUTINE W REFLEX MICROSCOPIC
BILIRUBIN URINE: NEGATIVE
GLUCOSE, UA: NEGATIVE
HGB URINE DIPSTICK: NEGATIVE
Ketones, ur: NEGATIVE
Leukocytes, UA: NEGATIVE
Nitrite: NEGATIVE
PH: 5.5 (ref 5.0–8.0)
PROTEIN: NEGATIVE
Specific Gravity, Urine: 1.02 (ref 1.001–1.035)

## 2016-02-07 LAB — URINE CULTURE: ORGANISM ID, BACTERIA: NO GROWTH

## 2016-02-13 ENCOUNTER — Ambulatory Visit (INDEPENDENT_AMBULATORY_CARE_PROVIDER_SITE_OTHER): Payer: BLUE CROSS/BLUE SHIELD | Admitting: Internal Medicine

## 2016-02-13 ENCOUNTER — Ambulatory Visit (HOSPITAL_COMMUNITY)
Admission: RE | Admit: 2016-02-13 | Discharge: 2016-02-13 | Disposition: A | Discharge status: Home / Self Care | Payer: BLUE CROSS/BLUE SHIELD | Source: Ambulatory Visit | Attending: Internal Medicine | Admitting: Internal Medicine

## 2016-02-13 ENCOUNTER — Encounter: Payer: Self-pay | Admitting: Internal Medicine

## 2016-02-13 VITALS — BP 104/66 | HR 94 | Temp 98.4°F | Resp 18 | Ht 72.05 in | Wt 357.0 lb

## 2016-02-13 DIAGNOSIS — J449 Chronic obstructive pulmonary disease, unspecified: Secondary | ICD-10-CM | POA: Insufficient documentation

## 2016-02-13 DIAGNOSIS — R05 Cough: Secondary | ICD-10-CM | POA: Diagnosis not present

## 2016-02-13 DIAGNOSIS — R0989 Other specified symptoms and signs involving the circulatory and respiratory systems: Secondary | ICD-10-CM | POA: Insufficient documentation

## 2016-02-13 DIAGNOSIS — R0602 Shortness of breath: Secondary | ICD-10-CM

## 2016-02-13 DIAGNOSIS — R5381 Other malaise: Secondary | ICD-10-CM

## 2016-02-13 DIAGNOSIS — J441 Chronic obstructive pulmonary disease with (acute) exacerbation: Secondary | ICD-10-CM | POA: Diagnosis not present

## 2016-02-13 LAB — CBC WITH DIFFERENTIAL/PLATELET
BASOS ABS: 97 {cells}/uL (ref 0–200)
Basophils Relative: 1 %
Eosinophils Absolute: 291 cells/uL (ref 15–500)
Eosinophils Relative: 3 %
HEMATOCRIT: 46.6 % (ref 38.5–50.0)
HEMOGLOBIN: 15.6 g/dL (ref 13.2–17.1)
LYMPHS ABS: 2813 {cells}/uL (ref 850–3900)
Lymphocytes Relative: 29 %
MCH: 30.1 pg (ref 27.0–33.0)
MCHC: 33.5 g/dL (ref 32.0–36.0)
MCV: 90 fL (ref 80.0–100.0)
MONO ABS: 970 {cells}/uL — AB (ref 200–950)
MPV: 10 fL (ref 7.5–12.5)
Monocytes Relative: 10 %
NEUTROS PCT: 57 %
Neutro Abs: 5529 cells/uL (ref 1500–7800)
Platelets: 193 10*3/uL (ref 140–400)
RBC: 5.18 MIL/uL (ref 4.20–5.80)
RDW: 15.3 % — ABNORMAL HIGH (ref 11.0–15.0)
WBC: 9.7 10*3/uL (ref 3.8–10.8)

## 2016-02-13 LAB — BASIC METABOLIC PANEL WITH GFR
BUN: 11 mg/dL (ref 7–25)
CALCIUM: 8.6 mg/dL (ref 8.6–10.3)
CHLORIDE: 105 mmol/L (ref 98–110)
CO2: 26 mmol/L (ref 20–31)
CREATININE: 0.65 mg/dL — AB (ref 0.70–1.33)
GFR, Est African American: >89 mL/min (ref >=60)
GLUCOSE: 98 mg/dL (ref 65–99)
Potassium: 4.4 mmol/L (ref 3.5–5.3)
Sodium: 142 mmol/L (ref 135–146)

## 2016-02-13 MED ORDER — BENZONATATE 200 MG PO CAPS: 200.0000 mg | ORAL_CAPSULE | Freq: Three times a day (TID) | ORAL | 0 refills | Status: DC | PRN

## 2016-02-13 MED ORDER — AZITHROMYCIN 250 MG PO TABS: ORAL_TABLET | ORAL | 0 refills | Status: DC

## 2016-02-13 MED ORDER — FLUTICASONE FUROATE-VILANTEROL 200-25 MCG/INH IN AEPB: 1.0000 | INHALATION_SPRAY | Freq: Every day | RESPIRATORY_TRACT | 0 refills | Status: DC

## 2016-02-13 NOTE — Progress Notes (Signed)
HPI  Patient presents to the office for evaluation of cough and weakness.  Patient reports that he has been having a very hard time recovering from his recent urosepsis.  Patient was treated with augmentin and was finishing his course of Augmentin at the end of August.  He had negative chest xrays while in the hospital.  He also took a course of prednisone after finishing the augmentin.  Since this time he has had a repeat urine culture which was negative.  He still reports that he is coughing and it is very deep.  He reports that he is getting mostly clear sputum coming up.  He reports that he feels either hot and flushed and cold and clammy.  He reports that at nighttime he does have some heavier sweating.  He reports that he feels much worse this week than last week.  He does feel like his chest is also tight as well.  He is doing the dulera twice daily.  He is still using the albuterol inhaler daily as well.  He is using it twice daily currently.  He does get some relief with the albuterol.  He reports that he wakes himself up with coughing.  He does wheeze a lot.  He reports that he does not use a cpap machine at home.  He also feels like he is getting dehydrated.  He reports that he is getting headaches too.  He is drinking 5-6 big glasses of water per day.  He has seen a pulmonologist several years ago.  He reports that he has not seen one since the 90s.  He reports that his skin is improving a lot from his rash.    Review of Systems  Constitutional: Positive for chills. Negative for fever and malaise/fatigue.  HENT: Positive for congestion. Negative for ear pain and sore throat.   Respiratory: Positive for cough, shortness of breath and wheezing. Negative for sputum production.   Cardiovascular: Negative for chest pain, palpitations and leg swelling.  Neurological: Positive for headaches.    PE:  Vitals:   02/13/16 1110  BP: 104/66  Pulse: 94  Resp: 18  Temp: 98.4 F (36.9 C)     General:  Alert and non-toxic, WDWN, NAD HEENT: NCAT, PERLA, EOM normal, no occular discharge or erythema.  Nasal mucosal edema with sinus tenderness to palpation.  Oropharynx clear with minimal oropharyngeal edema and erythema.  Mucous membranes moist and pink. Neck:  Cervical adenopathy Chest:  RRR no MRGs.  Lungs clear to auscultation A&P with no wheezes rhonchi or rales.   Abdomen: +BS x 4 quadrants, soft, non-tender, no guarding, rigidity, or rebound. Skin: warm and dry no rash Neuro: A&Ox4, CN II-XII grossly intact  Assessment and Plan:   1. COPD exacerbation ([place]) -likely reactive from allergic rhinitis -just finished prednisone taper.  Will try to maximize inhalers and avoid systemic use of steroids.   -zpak -tessalon as phenergan dm on backorder - DG Chest 2 View; Future - CBC with Differential/Platelet - BASIC METABOLIC PANEL WITH GFR - Brain natriuretic peptide [id number]  2. Shortness of breath  - CBC with Differential/Platelet - BASIC METABOLIC PANEL WITH GFR - Brain natriuretic peptide [id number])  3.  Physical Deconditioning -referral to PT

## 2016-02-13 NOTE — Patient Instructions (Addendum)
Stop the [employer] using breo once per day.    Please  Start using incruse once per day.    Please use albuterol every 6 hours as needed for shortness of breath, wheezing.  Use tessalon perles 3 times per day as needed for coughing.  Take mucinex twice daily.

## 2016-02-14 LAB — BRAIN NATRIURETIC PEPTIDE: Brain Natriuretic Peptide: 31.8 pg/mL (ref <100)

## 2016-02-21 ENCOUNTER — Other Ambulatory Visit: Payer: Self-pay | Admitting: Internal Medicine

## 2016-02-21 ENCOUNTER — Encounter: Payer: Self-pay | Admitting: Internal Medicine

## 2016-02-21 DIAGNOSIS — R05 Cough: Secondary | ICD-10-CM

## 2016-02-21 DIAGNOSIS — R058 Other specified cough: Secondary | ICD-10-CM

## 2016-02-21 MED ORDER — PSEUDOEPH-BROMPHEN-DM 30-2-10 MG/5ML PO SYRP: 5.0000 mL | ORAL_SOLUTION | Freq: Three times a day (TID) | ORAL | 0 refills | Status: DC | PRN

## 2016-02-25 ENCOUNTER — Encounter: Payer: Self-pay | Admitting: Internal Medicine

## 2016-02-26 ENCOUNTER — Encounter: Payer: Self-pay | Admitting: Internal Medicine

## 2016-02-28 ENCOUNTER — Other Ambulatory Visit: Payer: Self-pay | Admitting: Internal Medicine

## 2016-02-28 DIAGNOSIS — R058 Other specified cough: Secondary | ICD-10-CM

## 2016-02-28 DIAGNOSIS — R05 Cough: Secondary | ICD-10-CM

## 2016-03-03 DIAGNOSIS — R5381 Other malaise: Secondary | ICD-10-CM | POA: Diagnosis not present

## 2016-03-03 DIAGNOSIS — M25542 Pain in joints of left hand: Secondary | ICD-10-CM | POA: Diagnosis not present

## 2016-03-03 DIAGNOSIS — M542 Cervicalgia: Secondary | ICD-10-CM | POA: Diagnosis not present

## 2016-03-03 DIAGNOSIS — M25562 Pain in left knee: Secondary | ICD-10-CM | POA: Diagnosis not present

## 2016-03-04 ENCOUNTER — Encounter: Payer: Self-pay | Admitting: Internal Medicine

## 2016-03-04 ENCOUNTER — Other Ambulatory Visit: Payer: Self-pay | Admitting: Physician Assistant

## 2016-03-05 ENCOUNTER — Encounter: Payer: Self-pay | Admitting: Internal Medicine

## 2016-03-05 DIAGNOSIS — M25562 Pain in left knee: Secondary | ICD-10-CM | POA: Diagnosis not present

## 2016-03-05 DIAGNOSIS — M542 Cervicalgia: Secondary | ICD-10-CM | POA: Diagnosis not present

## 2016-03-05 DIAGNOSIS — R5381 Other malaise: Secondary | ICD-10-CM | POA: Diagnosis not present

## 2016-03-05 DIAGNOSIS — M25542 Pain in joints of left hand: Secondary | ICD-10-CM | POA: Diagnosis not present

## 2016-03-11 DIAGNOSIS — M25562 Pain in left knee: Secondary | ICD-10-CM | POA: Diagnosis not present

## 2016-03-11 DIAGNOSIS — R5381 Other malaise: Secondary | ICD-10-CM | POA: Diagnosis not present

## 2016-03-11 DIAGNOSIS — M542 Cervicalgia: Secondary | ICD-10-CM | POA: Diagnosis not present

## 2016-03-11 DIAGNOSIS — M25542 Pain in joints of left hand: Secondary | ICD-10-CM | POA: Diagnosis not present

## 2016-03-20 ENCOUNTER — Other Ambulatory Visit: Payer: Self-pay | Admitting: Internal Medicine

## 2016-03-20 DIAGNOSIS — G43709 Chronic migraine without aura, not intractable, without status migrainosus: Secondary | ICD-10-CM | POA: Diagnosis not present

## 2016-03-20 DIAGNOSIS — R058 Other specified cough: Secondary | ICD-10-CM

## 2016-03-20 DIAGNOSIS — R05 Cough: Secondary | ICD-10-CM

## 2016-03-24 DIAGNOSIS — E291 Testicular hypofunction: Secondary | ICD-10-CM | POA: Diagnosis not present

## 2016-03-24 DIAGNOSIS — N401 Enlarged prostate with lower urinary tract symptoms: Secondary | ICD-10-CM | POA: Diagnosis not present

## 2016-03-24 DIAGNOSIS — N3944 Nocturnal enuresis: Secondary | ICD-10-CM | POA: Diagnosis not present

## 2016-03-24 DIAGNOSIS — N3010 Interstitial cystitis (chronic) without hematuria: Secondary | ICD-10-CM | POA: Diagnosis not present

## 2016-03-25 DIAGNOSIS — M25542 Pain in joints of left hand: Secondary | ICD-10-CM | POA: Diagnosis not present

## 2016-03-25 DIAGNOSIS — M25562 Pain in left knee: Secondary | ICD-10-CM | POA: Diagnosis not present

## 2016-03-25 DIAGNOSIS — M542 Cervicalgia: Secondary | ICD-10-CM | POA: Diagnosis not present

## 2016-03-25 DIAGNOSIS — R5381 Other malaise: Secondary | ICD-10-CM | POA: Diagnosis not present

## 2016-03-26 ENCOUNTER — Other Ambulatory Visit: Payer: Self-pay

## 2016-03-26 ENCOUNTER — Telehealth (INDEPENDENT_AMBULATORY_CARE_PROVIDER_SITE_OTHER): Payer: Self-pay | Admitting: Sports Medicine

## 2016-03-26 DIAGNOSIS — M2550 Pain in unspecified joint: Secondary | ICD-10-CM | POA: Diagnosis not present

## 2016-03-26 DIAGNOSIS — M25569 Pain in unspecified knee: Secondary | ICD-10-CM | POA: Diagnosis not present

## 2016-03-26 MED ORDER — AZELASTINE HCL 0.1 % NA SOLN: 2.0000 | Freq: Two times a day (BID) | NASAL | 0 refills | Status: DC

## 2016-03-26 NOTE — Telephone Encounter (Signed)
Patient to know if there are any results from the fluid that was drawn from his knee at his last appointment.  Contact Info: [phone]

## 2016-03-27 DIAGNOSIS — M25562 Pain in left knee: Secondary | ICD-10-CM | POA: Diagnosis not present

## 2016-03-27 DIAGNOSIS — M25542 Pain in joints of left hand: Secondary | ICD-10-CM | POA: Diagnosis not present

## 2016-03-27 DIAGNOSIS — R5381 Other malaise: Secondary | ICD-10-CM | POA: Diagnosis not present

## 2016-03-27 DIAGNOSIS — M542 Cervicalgia: Secondary | ICD-10-CM | POA: Diagnosis not present

## 2016-03-28 ENCOUNTER — Encounter: Payer: Self-pay | Admitting: Allergy & Immunology

## 2016-03-28 ENCOUNTER — Ambulatory Visit (INDEPENDENT_AMBULATORY_CARE_PROVIDER_SITE_OTHER): Payer: BLUE CROSS/BLUE SHIELD | Admitting: Allergy & Immunology

## 2016-03-28 VITALS — Ht 72.0 in | Wt 368.0 lb

## 2016-03-28 DIAGNOSIS — Z91038 Other insect allergy status: Secondary | ICD-10-CM | POA: Diagnosis not present

## 2016-03-28 DIAGNOSIS — J3089 Other allergic rhinitis: Secondary | ICD-10-CM

## 2016-03-28 DIAGNOSIS — Z91030 Bee allergy status: Secondary | ICD-10-CM

## 2016-03-28 DIAGNOSIS — J449 Chronic obstructive pulmonary disease, unspecified: Secondary | ICD-10-CM | POA: Diagnosis not present

## 2016-03-28 DIAGNOSIS — J45901 Unspecified asthma with (acute) exacerbation: Secondary | ICD-10-CM | POA: Diagnosis not present

## 2016-03-28 DIAGNOSIS — B999 Unspecified infectious disease: Secondary | ICD-10-CM | POA: Diagnosis not present

## 2016-03-28 DIAGNOSIS — T781XXD Other adverse food reactions, not elsewhere classified, subsequent encounter: Secondary | ICD-10-CM

## 2016-03-28 DIAGNOSIS — J4550 Severe persistent asthma, uncomplicated: Secondary | ICD-10-CM | POA: Diagnosis not present

## 2016-03-28 LAB — CBC WITH DIFFERENTIAL/PLATELET
BASOS ABS: 0 {cells}/uL (ref 0–200)
Basophils Relative: 0 %
EOS ABS: 354 {cells}/uL (ref 15–500)
Eosinophils Relative: 3 %
HEMATOCRIT: 43 % (ref 38.5–50.0)
Hemoglobin: 14.2 g/dL (ref 13.2–17.1)
LYMPHS PCT: 33 %
Lymphs Abs: 3894 cells/uL (ref 850–3900)
MCH: 30.1 pg (ref 27.0–33.0)
MCHC: 33 g/dL (ref 32.0–36.0)
MCV: 91.3 fL (ref 80.0–100.0)
MONO ABS: 1062 {cells}/uL — AB (ref 200–950)
MONOS PCT: 9 %
MPV: 9.9 fL (ref 7.5–12.5)
NEUTROS ABS: 6490 {cells}/uL (ref 1500–7800)
Neutrophils Relative %: 55 %
PLATELETS: 309 10*3/uL (ref 140–400)
RBC: 4.71 MIL/uL (ref 4.20–5.80)
RDW: 14.6 % (ref 11.0–15.0)
WBC: 11.8 10*3/uL — ABNORMAL HIGH (ref 3.8–10.8)

## 2016-03-28 NOTE — Patient Instructions (Addendum)
1. Recurrent infections - We will get an immune workup today: Immunoglobulin panel, CBC with differential, pneumococcal titers, diphtheria titers, tetanus titers, Haemophilus titers, and CH 50 - If any of the above are abnormal, we will pursue further workup. - I feel that many of your infections are related to year uncontrolled asthma and allergies.  2. Severe persistent asthma, uncomplicated - Stop Dulera and start Symbicort 160/4.5 two puffs twice daily with spacer. - Lung testing was normal today and there was not much reversibility with the nebulizer treatment. - We will treat you with a steroid taper to see if this helps you to feel better as well.  - I will have Tammy, our biologics person - see how much copays would be for the monthly injectable medication to help with asthma (Nucala).   3. Chronic allergic rhinitis - Testing today showed: sweet vernal grass pollen, box elder tree pollen, cat pollen, weed mix, ragweed mix, all mold mixes, cockroach, and dust mite - Start Dymista 2 sprays per nostril 1-2 times daily. - Use cetirizine 10mg  daily as needed for breakthrough symptoms. - We can discuss starting allergy shots in the future if needed, which can help control your allergies as well as your asthma.   4. Return in about 2 weeks (around 04/11/2016).  Please inform us of any Emergency Department visits, hospitalizations, or changes in symptoms. Call us before going to the ED for breathing or allergy symptoms since we might be able to fit you in for a sick visit. Feel free to contact us anytime with any questions, problems, or concerns.  It was a pleasure to meet you today!   Websites that have reliable patient information: 1. American Academy of Asthma, Allergy, and Immunology: www.aaaai.org 2. Food Allergy Research and Education (FARE): foodallergy.org 3. Mothers of Asthmatics: http://www.asthmacommunitynetwork.org 4. American College of Allergy, Asthma, and Immunology:  www.acaai.org  Reducing Pollen Exposure  The American Academy of Allergy, Asthma and Immunology suggests the following steps to reduce your exposure to pollen during allergy seasons.    1. Do not hang sheets or clothing out to dry; pollen may collect on these items. 2. Do not mow lawns or spend time around freshly cut grass; mowing stirs up pollen. 3. Keep windows closed at night.  Keep car windows closed while driving. 4. Minimize morning activities outdoors, a time when pollen counts are usually at their highest. 5. Stay indoors as much as possible when pollen counts or humidity is high and on windy days when pollen tends to remain in the air longer. 6. Use air conditioning when possible.  Many air conditioners have filters that trap the pollen spores. 7. Use a HEPA room air filter to remove pollen form the indoor air you breathe.  Control of Dog or Cat Allergen  Avoidance is the best way to manage a dog or cat allergy. If you have a dog or cat and are allergic to dog or cats, consider removing the dog or cat from the home. If you have a dog or cat but don't want to find it a new home, or if your family wants a pet even though someone in the household is allergic, here are some strategies that may help keep symptoms at bay:  1. Keep the pet out of your bedroom and restrict it to only a few rooms. Be advised that keeping the dog or cat in only one room will not limit the allergens to that room. 2. Don't pet, hug or kiss the dog  or cat; if you do, wash your hands with soap and water. 3. High-efficiency particulate air (HEPA) cleaners run continuously in a bedroom or living room can reduce allergen levels over time. 4. Regular use of a high-efficiency vacuum cleaner or a central vacuum can reduce allergen levels. 5. Giving your dog or cat a bath at least once a week can reduce airborne allergen.  Control of Mold Allergen  Mold and fungi can grow on a variety of surfaces provided certain  temperature and moisture conditions exist.  Outdoor molds grow on plants, decaying vegetation and soil.  The major outdoor mold, Alternaria and Cladosporium, are found in very high numbers during hot and dry conditions.  Generally, a late Summer - Fall peak is seen for common outdoor fungal spores.  Rain will temporarily lower outdoor mold spore count, but counts rise rapidly when the rainy period ends.  The most important indoor molds are Aspergillus and Penicillium.  Dark, humid and poorly ventilated basements are ideal sites for mold growth.  The next most common sites of mold growth are the bathroom and the kitchen.  Outdoor [employer] 1. Use air conditioning and keep windows closed 2. Avoid exposure to decaying vegetation. 3. Avoid leaf raking. 4. Avoid grain handling. 5. Consider wearing a face mask if working in moldy areas.  Indoor Mold Control 1. Maintain humidity below 50%. 2. Clean washable surfaces with 5% bleach solution. 3. Remove sources e.g. contaminated carpets.  Control of House Dust Mite Allergen    House dust mites play a major role in allergic asthma and rhinitis.  They occur in environments with high humidity wherever human skin, the food for dust mites is found. High levels have been detected in dust obtained from mattresses, pillows, carpets, upholstered furniture, bed covers, clothes and soft toys.  The principal allergen of the house dust mite is found in its feces.  A gram of dust may contain 1,000 mites and 250,000 fecal particles.  Mite antigen is easily measured in the air during house cleaning activities.    1. Encase mattresses, including the box spring, and pillow, in an air tight cover.  Seal the zipper end of the encased mattresses with wide adhesive tape. 2. Wash the bedding in water of 130 degrees Farenheit weekly.  Avoid cotton comforters/quilts and flannel bedding: the most ideal bed covering is the dacron comforter. 3. Remove all upholstered furniture  from the bedroom. 4. Remove carpets, carpet padding, rugs, and non-washable window drapes from the bedroom.  Wash drapes weekly or use plastic window coverings. 5. Remove all non-washable stuffed toys from the bedroom.  Wash stuffed toys weekly. 6. Have the room cleaned frequently with a vacuum cleaner and a damp dust-mop.  The patient should not be in a room which is being cleaned and should wait 1 hour after cleaning before going into the room. 7. Close and seal all heating outlets in the bedroom.  Otherwise, the room will become filled with dust-laden air.  An electric heater can be used to heat the room. 8. Reduce indoor humidity to less than 50%.  Do not use a humidifier.

## 2016-03-28 NOTE — Progress Notes (Signed)
NEW PATIENT  Date of Service/Encounter:  03/29/16   Assessment:   Recurrent infections Severe persistent asthma, uncomplicated Chronic nonseasonal allergic rhinitis due to pollen Adverse food reactions (vague symptoms) Hymenoptera venom allergy Obstructive sleep apnea - not on CPAP GERD - on PPI Impaired glucose tolerance - on metformin Hyperlipidemia - on a statin Essential hypertension - on atenolol Polypharmacy   Asthma Reportables:  Severity: severe persistent  Risk: high Control: not well controlled  Seasonal Influenza Vaccine: yes   The CDC recommends patients with persistent asthma between the ages of 19-64 years receive PPSV-23 vaccination, if this patient qualifies, has he/she received 1 dose of PPSV-23 yet? No    Plan/Recommendations:   1. Recurrent infections - Isaack has had some serious infections in the last few years, and overall they have been increasing in frequency.  - We will get an immune workup today: Immunoglobulin panel, CBC with differential, pneumococcal titers, diphtheria titers, tetanus titers, Haemophilus titers, and CH 50 - If any of the above are abnormal, we will pursue further workup. - I feel that many of your infections are related to year uncontrolled asthma and allergies.  2. Severe persistent asthma, uncomplicated (AAT enzyme level normal 2010) with COPD Overlap - Stop Dulera and start Symbicort 160/4.5 two puffs twice daily with spacer. - Symbicort 160/4.5 is approved for COPD and might be able to help where Ruthe Mannan has not worked.  - Lung testing was normal today and there was not much reversibility with the nebulizer treatment. - We will treat Mr. Sicard with a steroid taper to see if this helps him to feel better as well, which would answer the question as to whether or not these symptoms are related to asthma versus another etiology (deconditioning versus cardiac). - I will have Tammy, our biologics person, see how much copays  would be for the monthly injectable medication to help with asthma (Nucala). - Latest AEC was 291 (02/13/16). - As a severe asthmatic, Mr. Pilat would qualify for the Pneumovax (if not given already), which needs to be given at the PCP's office. We could use this diagnostically as well since we could compare titers pre and post vaccination. As we sent titers today, he could receive the vaccine whenever he is able and obtain post-vaccination titers 4-6 weeks after his Pneumovax.  3. Chronic allergic rhinitis - Testing today showed: sweet vernal grass pollen, box elder tree pollen, cat pollen, weed mix, ragweed mix, all mold mixes, cockroach, and dust mite - Start Dymista 2 sprays per nostril 1-2 times daily. - Use cetirizine 10mg  daily as needed for breakthrough symptoms. - We can discuss starting allergy shots in the future if needed, which can help control your allergies as well as your asthma.   4. Adverse food reactions - Testing to all of the most common foods was negative. - Testing does have a good negative predictive value, therefore it is likely safe to continue to consume these foods. - Symptoms are likely related to either IBS or GERD.   5. Hymenoptera venom allergy  - Testing in the past was mildly positive to [facility] and [employer]. - Patient carries epinephrine with him at all times. - He is low risk for a sting, therefore will defer venom immunotherapy at this time.   6. Return in about 2 weeks (around 04/11/2016).    Subjective:   VALEN LANDWEHR is a 55 y.o. male presenting today for evaluation of  Chief Complaint  Patient presents with  .  Asthma  .  [name] has a history of the following: Patient Active Problem List   Diagnosis Date Noted  . Recurrent infections 03/29/2016  . Chronic nonseasonal allergic rhinitis due to pollen 03/29/2016  . Glucose intolerance (impaired glucose tolerance) 01/16/2016  . Polypharmacy 01/16/2016  . Sepsis secondary to UTI  ([place]) 01/15/2016  . BMI 47.46,   adult ([place]) 03/06/2015  . SDAT 02/05/2015  . Asthma-COPD overlap syndrome ([place]) 02/05/2015  . OSA (obstructive sleep apnea) 02/05/2015  . Medication management 08/02/2014  . GERD (gastroesophageal reflux disease) 05/09/2014  . Obesity, Class III, BMI 40-49.9 (morbid obesity) ([place]) 02/01/2014  . Vitamin D deficiency 08/01/2013  . Prediabetes 08/01/2013  . 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 Diverticula of colon 05/07/2011  . Hypertension 01/31/2011  . Hyperlipidemia 01/31/2011  . Migraine 01/31/2011  . BPH (benign prostatic hyperplasia) 01/31/2011  . Prostatitis 01/31/2011  . Testosterone Deficiency 01/31/2011  . IBS (irritable bowel syndrome) 01/31/2011  . Partial complex seizure disorder with intractable epilepsy ([place]) 01/31/2011  . Depression, controlled 01/31/2011  . Mild chronic obstructive pulmonary disease ([place]) 01/31/2011    History obtained from: chart review and patient.  [name] was referred by [name], MD.     Libero is a 55 y.o. male presenting for asthma and allergies. He was last seen in August 2011 by Dr. Emilio Math, who has since left the practice. At that time, his asthma was under fairly good control but he was endorsing some exercise symptoms. Asthma was controlled with Asmanex and Singulair. Allergic rhinitis was not well controlled on nasal saline, Flonase, Astepro, and Pataday. Therefore the plan at that time was to restart allergy immunotherapy. He was started on a prednisone burst of 40mg  daily for three days. However, since that time he has never come back. His last skin testing was performed in August 2011 and noted allergies to grasses, weeds, trees, molds, cat, norse, cockroach, and dust mite. He first say Dr. Neldon Mc from [phone] and was treated with allergy immunotherapy, which he received for around one year. He then never followed up from 2001 through 2010, when he was seen by Dr. Shaune Leeks and then Dr. Emilio Math. He  was scheduled to restart allergy injections in 2011, but then never followed through with this.  Asthma/COPD History:  Since the last visit, he has done well until the past six months or so. He was changed to [facility] a few years ago by his PCP, who has been refilling his medications. He does not use a spacer. He was on Breo for around one month earlier this fall, but he did not feel that it helped therefore he switched back to [facility]. Lately over the last few monhts he has been using his albuterol continuously, especially since his hospitalization in August (see below). He does carry a diagnosis of COPD. Patient reports that he smoked socially when he was younger but has been off of cigarettes for over ten years or so. He does have a diagnosis of OSA and does not use his CPAP. Prior to August, he was doing fairly well per the patient. He estimates that he needs steroids around 1-2 times per year for breathing purposes. He endorses nightly coughing, although he attributes this to his OSA or GERD. He describes the cough as dry but sometimes productive of white mucous. Aside from August 2017, he has not required any hospitalizations. Last CXR in September 2017 was normal. He is going to see a Pulmonologist - Dr. Tera Partridge -  in mid November. Of note, he has been tested for alpha-1 anti-trypsin which demonstrated a wild type phenotype.   Allergic Rhinitis History: Mr. Fine has symptoms throughout the year. He is on Singulair 10mg  as well as cetirizine 10mg  tnightly. He is on Astelin nasal spray and has been on this in the past. He is interested in repeat skin testing today as well as possibly starting allergy shots.   Infectious History:  In August, 2017, he was admitted to the hospital for sepsis secondary to a Klebsiella pneumoniae UTI. He presented to his PCP with confusion, decreased urine output, and fever. He was promptly admitted to the hospital for management. He received 5 liters of IVF prior to  admission to the floor. Workup showed tachycardia as well as a leukocytosis of 19.9. Lactic acid was 3.22 initially. A urine culture grew out Klebsiella pneumoniaie but blood cultures were negative. He was placed on ceftriaxone and discharged on amox-clav. He did have some leg swelling secondary to fluid administration which was treated with lasix. He has a history of BPH, which is what was thought to have put him at risk of the UTI. Since the hospitalization, he has been undergoing rehab to regain some of his strength and endurance. Aside from this hospitalization, he last hospitalization was years ago, possibly for pneumonia. He reports that he was diagnosed with pneumonia 1-2 times per year prior to his visits with [place]. He does get sinus infections and ear infections fairly regularly, although this could be uncontrolled allergic rhinitis. He also gets the occasional bronchitis. According to the patient, he did receive Prevnar around 10-12 years ago and he has had it since that time as well. He has not had Pneumovax.    Mr. Mutch is unsure if he has food allergies. He does describe some vague abdominal pain with a multiple of foods, but no reactions were serious enough to require an ED evaluation or epinephrine administration. He does have a history of Hymenoptera venom allergy. Otherwise, there is no history of other atopic diseases, including drug allergies, food allergies, or urticaria. There is no significant infectious history. Vaccinations are up to date.    Past Medical History: Patient Active Problem List   Diagnosis Date Noted  . Recurrent infections 03/29/2016  . Chronic nonseasonal allergic rhinitis due to pollen 03/29/2016  . Glucose intolerance (impaired glucose tolerance) 01/16/2016  . Polypharmacy 01/16/2016  . Sepsis secondary to UTI ([place]) 01/15/2016  . BMI 47.46,   adult ([place]) 03/06/2015  . SDAT 02/05/2015  . Asthma-COPD overlap syndrome ([place]) 02/05/2015  . OSA (obstructive sleep  apnea) 02/05/2015  . Medication management 08/02/2014  . GERD (gastroesophageal reflux disease) 05/09/2014  . Obesity, Class III, BMI 40-49.9 (morbid obesity) ([place]) 02/01/2014  . Vitamin D deficiency 08/01/2013  . Prediabetes 08/01/2013  . 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 Diverticula of colon 05/07/2011  . Hypertension 01/31/2011  . Hyperlipidemia 01/31/2011  . Migraine 01/31/2011  . BPH (benign prostatic hyperplasia) 01/31/2011  . Prostatitis 01/31/2011  . Testosterone Deficiency 01/31/2011  . IBS (irritable bowel syndrome) 01/31/2011  . Partial complex seizure disorder with intractable epilepsy ([place]) 01/31/2011  . Depression, controlled 01/31/2011  . Mild chronic obstructive pulmonary disease ([place]) 01/31/2011    Medication List:    Medication List       Accurate as of 03/28/16 11:59 PM. Always use your most recent med list.          albuterol 108 (90 Base) MCG/ACT inhaler Commonly known as:  PROAIR  HFA INHALE 2 PUFFS INTO THE LUNGS EVERY 4 (FOUR) HOURS AS NEEDED FOR WHEEZING OR SHORTNESS OF BREATH.   alfuzosin 10 MG 24 hr tablet Commonly known as:  UROXATRAL Take 10 mg by mouth daily.   anastrozole 1 MG tablet Commonly known as:  ARIMIDEX Take 1 mg by mouth daily.   atenolol 100 MG tablet Commonly known as:  TENORMIN TAKE 1 TABLET BY MOUTH EVERY DAY   azelastine 0.1 % nasal spray Commonly known as:  ASTELIN Place 2 sprays into both nostrils 2 (two) times daily.   azelastine 0.1 % nasal spray Commonly known as:  ASTELIN Place 2 sprays into both nostrils 2 (two) times daily.   azithromycin 250 MG tablet Commonly known as:  ZITHROMAX Z-PAK 2 po day one, then 1 daily x 4 days   baclofen 10 MG tablet Commonly known as:  LIORESAL Take 10 mg by mouth as needed.   benzonatate 200 MG capsule Commonly known as:  TESSALON TAKE ONE CAPSULE 3 TIMES A DAY AS NEEDED FOR COUGH   Black Cohosh 160 MG Caps Take 160 mg by mouth daily.     brompheniramine-pseudoephedrine-DM 30-2-10 MG/5ML syrup TAKE 5MLS BY MOUTH 3 TIMES A DAY AS NEEDED   CALCIUM + D PO Take 500 mg by mouth 2 (two) times daily.   CEREFOLIN NAC 6-90.314-2-600 MG Tabs Take 1 tablet by mouth daily.   cetirizine 10 MG tablet Commonly known as:  ZYRTEC Take 10 mg by mouth daily.   Cinnamon 500 MG capsule Take 500 mg by mouth daily.   co-enzyme Q-10 30 MG capsule Take 30 mg by mouth 3 (three) times daily.   cyclobenzaprine 10 MG tablet Commonly known as:  FLEXERIL Take 10 mg by mouth 3 (three) times daily as needed for muscle spasms.   desmopressin 0.2 MG tablet Commonly known as:  DDAVP Take 200 mcg by mouth at bedtime.   donepezil 23 MG Tabs tablet Commonly known as:  ARICEPT Take 23 mg by mouth at bedtime.   eszopiclone 3 MG Tabs Generic drug:  Eszopiclone Take 3 mg by mouth at bedtime.   famotidine 20 MG tablet Commonly known as:  PEPCID Take 1 tablet (20 mg total) by mouth 2 (two) times daily.   Flax Seed Oil 1000 MG Caps Take 1,000 mg by mouth daily.   fluticasone furoate-vilanterol 200-25 MCG/INH Aepb Commonly known as:  BREO ELLIPTA Inhale 1 puff into the lungs daily.   furosemide 20 MG tablet Commonly known as:  LASIX Take 1 tablet (20 mg total) by mouth daily.   gabapentin 600 MG tablet Commonly known as:  NEURONTIN 1,200 mg 3 (three) times daily.   Garlic 123XX123 MG Tabs Take 100 mg by mouth daily.   glucosamine-chondroitin 500-400 MG tablet Take 1 tablet by mouth 3 (three) times daily.   guaiFENesin 600 MG 12 hr tablet Commonly known as:  MUCINEX Take 1,200 mg by mouth 2 (two) times daily.   L-METHYLFOLATE CALCIUM PO Take 1 tablet by mouth daily.   Lecithin 1200 MG Caps Take 1 capsule by mouth daily.   lidocaine 5 % Commonly known as:  LIDODERM Place 1 patch onto the skin daily. Remove & Discard patch within 12 hours or as directed by MD   metFORMIN 500 MG 24 hr tablet Commonly known as:  GLUCOPHAGE  XR Take one tablet twice daily with breakfast and dinner.   montelukast 10 MG tablet Commonly known as:  SINGULAIR Take 1 tablet (10 mg total) by mouth  daily.   multivitamin with minerals tablet Take 1 tablet by mouth daily.   multivitamin-lutein Caps capsule Take 1 capsule by mouth daily.   NAMENDA 10 MG tablet Generic drug:  memantine Take 20 mg by mouth daily.   NEXIUM 20 MG capsule Generic drug:  esomeprazole TAKE 1 CAPSULE (20 MG TOTAL) BY MOUTH DAILY AT 12 NOON.   oxyCODONE-acetaminophen 10-325 MG tablet Commonly known as:  PERCOCET Take 1 tablet by mouth every 4 (four) hours as needed for pain.   pentosan polysulfate 100 MG capsule Commonly known as:  ELMIRON Take 200 mg by mouth 3 (three) times daily before meals.   pravastatin 40 MG tablet Commonly known as:  PRAVACHOL TAKE 1 TABLET BY MOUTH AT BEDTIME   ramelteon 8 MG tablet Commonly known as:  ROZEREM Take 8 mg by mouth.   sertraline 50 MG tablet Commonly known as:  ZOLOFT Take 50 mg by mouth 2 (two) times daily.   tadalafil 5 MG tablet Commonly known as:  CIALIS Take 5 mg by mouth.   triamcinolone cream 0.1 % Commonly known as:  KENALOG APPLY 1 APPLICATION TOPICALLY 4 (FOUR) TIMES DAILY AS NEEDED.   trospium 20 MG tablet Commonly known as:  SANCTURA 20 mg 2 (two) times daily.   VAYACOG 100-19.5-6.5 MG Caps Generic drug:  Phosphatidylserine-DHA-EPA Take 1 capsule by mouth daily.   VITAMIN D PO Take 5,000 Units by mouth 4 (four) times daily.   zinc gluconate 50 MG tablet Take 50 mg by mouth daily.   zolpidem 10 MG tablet Commonly known as:  AMBIEN Take 10 mg by mouth.       Birth History: non-contributory.     Past Surgical History: Past Surgical History:  Procedure Laterality Date  . ANKLE FRACTURE SURGERY Right   . CYSTOSCOPY     [place]  . TONSILLECTOMY    . TURBINATE RESECTION  2007     Family History: Family History  Problem Relation Age of Onset  . Diabetes  Paternal Uncle   . Prostate cancer    . Cancer Father     lymphoma, colon  . Diabetes Maternal Grandmother   . Heart disease Maternal Grandfather   . Diabetes Maternal Grandfather   . Diabetes Paternal Grandmother   . Diabetes Paternal Grandfather      Social History: Sunil lives at home. 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 He is unmarried and does not have children.    Review of Systems: a 14-point review of systems is pertinent for what is mentioned in HPI.  Otherwise, all other systems were negative. Constitutional: negative other than that listed in the HPI Eyes: negative other than that listed in the HPI Ears, nose, mouth, throat, and face: negative other than that listed in the HPI Respiratory: negative other than that listed in the HPI Cardiovascular: negative other than that listed in the HPI Gastrointestinal: negative other than that listed in the HPI Genitourinary: negative other than that listed in the HPI Integument: negative other than that listed in the HPI Hematologic: negative other than that listed in the HPI Musculoskeletal: negative other than that listed in the HPI Neurological: negative other than that listed in the HPI Allergy/Immunologic: negative other than that listed in the HPI    Objective:   BP: 146/88, Temp: afebrile, HR: 90, RR 20  Height 6' (1.829 m), weight (!) 368 lb (166.9 kg). Body mass index is 49.91 kg/m.   Physical Exam:  General: Alert,  interactive, in no acute distress. Obese male. Answers questions in full sentences. Very appreciative.  HEENT: TMs pearly gray, turbinates edematous and pale with clear discharge, post-pharynx markedly erythematous. Neck: Supple without thyromegaly. Adenopathy: no enlarged lymph nodes appreciated in the anterior cervical, occipital, axillary, epitrochlear, inguinal, or popliteal regions Lungs: Decreased breath sounds bilaterally without wheezing, rhonchi or rales. No  increased work of breathing. No taking deep breaths but no crackles appreciated. Difficult to evaluate for retractions given body habitus.  CV: Normal S1/S2, no murmurs. Capillary refill <2 seconds.  Abdomen: Nondistended, nontender. No guarding or rebound tenderness. Bowel sounds present in all fields and hypoactive  Skin: Warm and dry, without lesions or rashes. Extremities:  No clubbing, cyanosis or edema. Neuro:   Grossly intact. Answers questions appropriately.   Diagnostic studies:  Spirometry: results normal (FEV1: 2.8/69%, FVC: 4.03/80%, FEV1/FVC: 69%).    Spirometry consistent with normal pattern. DuoNeb nebulizer treatment given in clinic with no improvement. However, Mr. Natale did feel significantly improved.   Allergy Studies:   Indoor/Outdoor Percutaneous Adult Environmental Panel: positive to sweet vernal grass pollen, box elder tree pollen, and cat hair dander with adequate controls. Overall, he was rather dermatographic and difficult to read.  Indoor/Outdoor Selected Intradermal Environmental Panel: Positive to ragweed mix, weed mix, mold mix #1 through 4, cockroach, and dust mite.  Most Common Foods Panel (peanut, tree nut, soy, fish mix, shellfish mix, wheat, milk, egg): negative to all with adequate controls     [name], MD [facility]

## 2016-03-28 NOTE — Telephone Encounter (Signed)
Called and left voicemail advising patient that they were no results from the fluid that was drawn off his knee at his last visit.

## 2016-03-29 DIAGNOSIS — B999 Unspecified infectious disease: Secondary | ICD-10-CM | POA: Insufficient documentation

## 2016-03-29 DIAGNOSIS — J3089 Other allergic rhinitis: Secondary | ICD-10-CM | POA: Insufficient documentation

## 2016-03-31 DIAGNOSIS — M542 Cervicalgia: Secondary | ICD-10-CM | POA: Diagnosis not present

## 2016-03-31 DIAGNOSIS — M25542 Pain in joints of left hand: Secondary | ICD-10-CM | POA: Diagnosis not present

## 2016-03-31 DIAGNOSIS — R5381 Other malaise: Secondary | ICD-10-CM | POA: Diagnosis not present

## 2016-03-31 DIAGNOSIS — M25562 Pain in left knee: Secondary | ICD-10-CM | POA: Diagnosis not present

## 2016-03-31 LAB — COMPLEMENT, TOTAL

## 2016-03-31 LAB — IGE: IgE (Immunoglobulin E), Serum: 92 kU/L (ref <115)

## 2016-03-31 LAB — IGG, IGA, IGM
IgA: 154 mg/dL (ref 81–463)
IgG (Immunoglobin G), Serum: 583 mg/dL — ABNORMAL LOW (ref 694–1618)
IgM, Serum: 46 mg/dL — ABNORMAL LOW (ref 48–271)

## 2016-03-31 LAB — DIPHTHERIA / TETANUS ANTIBODY PANEL
Diphtheria Ab: 0.23 IU/mL
TETANUS TOXIN ANTIBODY, TOTAL: 0.92 [IU]/mL (ref >0.15)

## 2016-04-01 ENCOUNTER — Encounter (INDEPENDENT_AMBULATORY_CARE_PROVIDER_SITE_OTHER): Payer: Self-pay | Admitting: Sports Medicine

## 2016-04-01 ENCOUNTER — Ambulatory Visit (INDEPENDENT_AMBULATORY_CARE_PROVIDER_SITE_OTHER): Payer: BLUE CROSS/BLUE SHIELD | Admitting: Sports Medicine

## 2016-04-01 VITALS — BP 124/79 | HR 73 | Temp 99.6°F | Ht 72.0 in

## 2016-04-01 DIAGNOSIS — G8929 Other chronic pain: Secondary | ICD-10-CM | POA: Diagnosis not present

## 2016-04-01 DIAGNOSIS — M25562 Pain in left knee: Secondary | ICD-10-CM

## 2016-04-01 DIAGNOSIS — M25462 Effusion, left knee: Secondary | ICD-10-CM

## 2016-04-01 LAB — STREP PNEUMONIAE 14 SEROTYPES IGG
STREP PNEUMO TYPE 12: 0.9
STREP PNEUMO TYPE 19: 1.5
STREP PNEUMONIAE TYPE 1 ABS: 2.6
STREP PNEUMONIAE TYPE 14 ABS: 5.5
STREP PNEUMONIAE TYPE 23F ABS: 1.4
STREP PNEUMONIAE TYPE 3 ABS: 0.5
STREP PNEUMONIAE TYPE 6B ABS: 3.2
STREP PNEUMONIAE TYPE 7F ABS: 8.7
STREP PNEUMONIAE TYPE 8 ABS: 0.6
STREP PNEUMONIAE TYPE 9N ABS: 0.5
Strep pneumo Type 4: 1.1
Strep pneumo Type 9: 1.3
Strep pneumoniae Type 18C Abs: 2
Strep pneumoniae Type 5 Abs: 2.1

## 2016-04-01 LAB — HAEMOPHILIUS INFLUENZAE B AB IGG: INFLUENZA B VIRUS AB, IGG: 1.47 ug/mL (ref >=1.00)

## 2016-04-01 NOTE — Progress Notes (Signed)
Mark Harrell - 55 y.o. male MRN [id number]  Date of birth: 24-Mar-1961  Office Visit Note: Visit Date: 04/01/2016 PCP: [name], MD Referred by: [name], MD  Subjective: Chief Complaint  Patient presents with  . Right Knee - Pain  . Left Knee - Pain  . Follow-up   HPI: Patient states that both knees bother him ,but left knee is the worse. Had some swelling in the left knee, think it may be just a little now. Currently taking Prednisone  Knee has been quite painful for him over the past several weeks. No known injury. He is having some clicking & popping but no locking. He did have one episode of giving way. No secondary trauma.. He has had a fairly significant medical history with multiple allergic & immune related issues. He is currently on low-dose of prednisone per his allergist & reports as needed improve following starting this. He denies any current fevers or chills. There is no overlying skin changes of the knee.    ROS Otherwise per HPI.  Assessment & Plan: Visit Diagnoses:  1. Effusion, left knee   2. Chronic pain of left knee     Plan: Findings:  Knee does have a small effusion today & fluid will be sent for cell count & crystal analysis. I agree that he likely has some underlying autoimmune versus immunodeficiency type syndrome & this may be contributing to the polyarthralgia complains that he has. He is currently seeing a rheumatologist but I do not have access to these records. Ultimately the knee has been bothersome for him for quite some time & has actually caused significant discomfort for him over time. I do think that further evaluation with MRI is warranted given the overall minimal arthritic findings on x-ray.    Meds & Orders: No orders of the defined types were placed in this encounter.   Orders Placed This Encounter  Procedures  . Large Joint Injection/Arthrocentesis  . MR Knee Left w/o contrast  . Synovial cell count + diff, w/  crystals    Follow-up: Return for MRI review.   Procedures: US Guided Left Knee Aspiration & Injection Date/Time: 04/01/2016 2:17 PM Performed by: Teresa Coombs D Authorized by: Teresa Coombs D   Indications:  Pain and joint swelling Location:  Knee Site:  L knee Needle Size:  18 G Needle Length:  1.5 inches Approach:  Superolateral Ultrasound Guidance: Yes   Fluoroscopic Guidance: No   Arthrogram: No Medications:  2 mL bupivacaine 0.5 %; 40 mg methylPREDNISolone acetate 40 MG/ML Aspiration Attempted: Yes   Aspirate amount (mL):  20 Lab: fluid sent for laboratory analysis   Comments: The patient's clinical condition is marked by substantial pain and/or significant functional disability. Other conservative therapy has not provided relief, is contraindicated, or not appropriate. There is a reasonable likelihood that injection will significantly improve the patient's pain and/or functional impairment.  After discussing the risks, benefits and expected outcomes of the injection and all questions were reviewed and answered, the patient wished to undergo the above named procedure.  Verbal consent was obtained. The target sight was prepped with alcohol scrub. Local anesthesia was obtained with ethyl chloride and 39mL of 1% lidocaine on a 25g needle.  Under real-time ultrasound guidance, Aspiration and Injection of the target structure was performed using the above needle and medications under sterile stopcock technique. Band-Aid and 6" Ace Wrap was applied. The patient tolerated this procedure well with no immediate complications. Post injection instructions were provided.  Clinical History: No specialty comments available.  He reports that he quit smoking about 20 years ago. He has never used smokeless tobacco.   Recent Labs  05/10/15 1545 08/13/15 1725  HGBA1C 5.5 5.7*    Objective:  VS:  HT:6' (182.9 cm)   WT:   BMI:     BP:124/79  HR:73bpm  TEMP:99.6 F (37.6 C)( )   RESP:  Physical Exam  Constitutional: He appears well-developed and well-nourished. No distress.  Alert and appropriately interactive.  HENT:  Head: Normocephalic and atraumatic.  Pulmonary/Chest: Effort normal. No respiratory distress.  Musculoskeletal:  Left knee is overall well aligned. He has pain with outpatient of the medial & lateral joint lines. He is a palpable click with McMurray's testing. Small effusion. He has 3-4 mm of opening with varus & valgus testing but solid endpoint  Skin: Skin is warm and dry. No rash noted. He is not diaphoretic. No erythema. No pallor.  Psychiatric: He has a normal mood and affect. His behavior is normal. Judgment and thought content normal.    Ortho Exam Imaging: No results found.  Past Medical/Family/Surgical/Social History: Medications & Allergies reviewed per EMR Patient Active Problem List   Diagnosis Date Noted  . Recurrent infections 03/29/2016  . Chronic nonseasonal allergic rhinitis due to pollen 03/29/2016  . Glucose intolerance (impaired glucose tolerance) 01/16/2016  . Polypharmacy 01/16/2016  . Sepsis secondary to UTI ([place]) 01/15/2016  . BMI 47.46,   adult ([place]) 03/06/2015  . SDAT 02/05/2015  . Asthma-COPD overlap syndrome ([place]) 02/05/2015  . OSA (obstructive sleep apnea) 02/05/2015  . Medication management 08/02/2014  . GERD (gastroesophageal reflux disease) 05/09/2014  . Obesity, Class III, BMI 40-49.9 (morbid obesity) ([place]) 02/01/2014  . Vitamin D deficiency 08/01/2013  . Prediabetes 08/01/2013  . 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 Diverticula of colon 05/07/2011  . Hypertension 01/31/2011  . Hyperlipidemia 01/31/2011  . Migraine 01/31/2011  . BPH (benign prostatic hyperplasia) 01/31/2011  . Prostatitis 01/31/2011  . Testosterone Deficiency 01/31/2011  . IBS (irritable bowel syndrome) 01/31/2011  . Partial complex seizure disorder with intractable epilepsy ([place]) 01/31/2011  . Depression, controlled 01/31/2011  . Mild  chronic obstructive pulmonary disease ([place]) 01/31/2011   Past Medical History:  Diagnosis Date  . Anesthesia complication requiring reversal agent administration    ? from central apnea, very difficult to get off vent  . Asthma   . BPH (benign prostatic hyperplasia)   . Dementia   . Hyperlipidemia   . Hypertension   . Hypogonadism male   . IBS (irritable bowel syndrome)   . OSA (obstructive sleep apnea)   . Prostatitis    Family History  Problem Relation Age of Onset  . Diabetes Paternal Uncle   . Prostate cancer    . Cancer Father     lymphoma, colon  . Diabetes Maternal Grandmother   . Heart disease Maternal Grandfather   . Diabetes Maternal Grandfather   . Diabetes Paternal Grandmother   . Diabetes Paternal Grandfather    Past Surgical History:  Procedure Laterality Date  . ANKLE FRACTURE SURGERY Right   . CYSTOSCOPY     [place]  . TONSILLECTOMY    . [place] RESECTION  2007   Social History   Occupational History  . Dance movement psychotherapist    Social History Main Topics  . Smoking status: Former Smoker    Quit date: 05/27/1995  . Smokeless tobacco: Never Used  . Alcohol use Yes     Comment: rarely  . Drug  use: No  . Sexual activity: No

## 2016-04-01 NOTE — Telephone Encounter (Signed)
Talked to patient at office visit today concerning any lab work from his last visit.

## 2016-04-01 NOTE — Patient Instructions (Signed)
We are ordering an MRI for you today.  The imaging office will be calling you to schedule your appointment after we obtain authorization from your insurance company.  Immediately after you schedule your appointment with them please call our office back ([phone]) to schedule a follow-up appointment with me.  This will need to be at least 24 hours after you have the test done to give radiologist and myself time to review the test.  We will discuss further treatment options at your follow up appointment.   

## 2016-04-02 DIAGNOSIS — M25542 Pain in joints of left hand: Secondary | ICD-10-CM | POA: Diagnosis not present

## 2016-04-02 DIAGNOSIS — M542 Cervicalgia: Secondary | ICD-10-CM | POA: Diagnosis not present

## 2016-04-02 DIAGNOSIS — M25562 Pain in left knee: Secondary | ICD-10-CM | POA: Diagnosis not present

## 2016-04-02 DIAGNOSIS — R5381 Other malaise: Secondary | ICD-10-CM | POA: Diagnosis not present

## 2016-04-02 LAB — SYNOVIAL CELL COUNT + DIFF, W/ CRYSTALS
Basophils, %: 0 %
EOSINOPHILS-SYNOVIAL: 0 % (ref 0–2)
Lymphocytes-Synovial Fld: 18 % (ref 0–74)
Monocyte/Macrophage: 63 % (ref 0–69)
Neutrophil, Synovial: 13 % (ref 0–24)
SYNOVIOCYTES, %: 6 % (ref 0–15)
WBC, SYNOVIAL: 285 {cells}/uL — AB (ref <150)

## 2016-04-03 ENCOUNTER — Telehealth: Payer: Self-pay | Admitting: Allergy & Immunology

## 2016-04-03 ENCOUNTER — Other Ambulatory Visit: Payer: Self-pay | Admitting: Physician Assistant

## 2016-04-03 DIAGNOSIS — R05 Cough: Secondary | ICD-10-CM

## 2016-04-03 DIAGNOSIS — R058 Other specified cough: Secondary | ICD-10-CM

## 2016-04-03 MED ORDER — AZELASTINE-FLUTICASONE 137-50 MCG/ACT NA SUSP
2.0000 | Freq: Two times a day (BID) | NASAL | 5 refills | Status: DC
Start: 2016-04-03 — End: 2016-04-23

## 2016-04-03 NOTE — Telephone Encounter (Signed)
Patient saw Dr. [name] 03-28-16. He was given a sample of Dymista. It has run out and he wants to know if a prescription can be called in for that. Pharmacy is [facility] on [address].

## 2016-04-03 NOTE — Telephone Encounter (Signed)
Patient advised will send rx in but may require PA so make take couples days to complete

## 2016-04-04 ENCOUNTER — Telehealth (INDEPENDENT_AMBULATORY_CARE_PROVIDER_SITE_OTHER): Payer: Self-pay

## 2016-04-04 MED ORDER — METHYLPREDNISOLONE ACETATE 40 MG/ML IJ SUSP
40.0000 mg | INTRAMUSCULAR | Status: AC | PRN
  Administered 2016-04-01: 40 mg via INTRA_ARTICULAR

## 2016-04-04 MED ORDER — BUPIVACAINE HCL 0.5 % IJ SOLN
2.0000 mL | INTRAMUSCULAR | Status: AC | PRN
  Administered 2016-04-01: 2 mL via INTRA_ARTICULAR

## 2016-04-04 NOTE — Progress Notes (Signed)
Please call patient & inform him that his fluid was positive for pseudogout which is a condition that accompanies osteoarthritis. There is no further intervention to do at this time & if there are any persistent or ongoing symptoms I am happy to see him back.

## 2016-04-04 NOTE — Telephone Encounter (Signed)
Called and left voicemail advising patient of message concerning lab results per Dr. Paulla Fore.

## 2016-04-04 NOTE — Telephone Encounter (Signed)
-----   Message from Gerda Diss, DO sent at 04/04/2016  4:17 PM EST ----- Please call patient & inform him that his fluid was positive for pseudogout which is a condition that accompanies osteoarthritis. There is no further intervention to do at this time & if there are any persistent or ongoing symptoms I am happy to see  him back.

## 2016-04-07 DIAGNOSIS — R5381 Other malaise: Secondary | ICD-10-CM | POA: Diagnosis not present

## 2016-04-07 DIAGNOSIS — M25542 Pain in joints of left hand: Secondary | ICD-10-CM | POA: Diagnosis not present

## 2016-04-07 DIAGNOSIS — M25562 Pain in left knee: Secondary | ICD-10-CM | POA: Diagnosis not present

## 2016-04-07 DIAGNOSIS — M542 Cervicalgia: Secondary | ICD-10-CM | POA: Diagnosis not present

## 2016-04-10 ENCOUNTER — Encounter: Payer: Self-pay | Admitting: Pulmonary Disease

## 2016-04-10 ENCOUNTER — Other Ambulatory Visit: Payer: BLUE CROSS/BLUE SHIELD

## 2016-04-10 ENCOUNTER — Ambulatory Visit (INDEPENDENT_AMBULATORY_CARE_PROVIDER_SITE_OTHER): Payer: BLUE CROSS/BLUE SHIELD | Admitting: Pulmonary Disease

## 2016-04-10 VITALS — BP 110/76 | HR 85 | Ht 72.0 in | Wt 357.0 lb

## 2016-04-10 DIAGNOSIS — Z23 Encounter for immunization: Secondary | ICD-10-CM

## 2016-04-10 DIAGNOSIS — J449 Chronic obstructive pulmonary disease, unspecified: Secondary | ICD-10-CM | POA: Diagnosis not present

## 2016-04-10 DIAGNOSIS — K219 Gastro-esophageal reflux disease without esophagitis: Secondary | ICD-10-CM

## 2016-04-10 DIAGNOSIS — G4733 Obstructive sleep apnea (adult) (pediatric): Secondary | ICD-10-CM

## 2016-04-10 DIAGNOSIS — J309 Allergic rhinitis, unspecified: Secondary | ICD-10-CM | POA: Diagnosis not present

## 2016-04-10 NOTE — Progress Notes (Signed)
Subjective:    Patient ID: Mark Harrell, male    DOB: 1961-03-01, 55 y.o.   MRN: [id number]  HPI He reports he was first diagnosed with asthma in the 1990s. He has had intermittent bronchitis once yearly and has had recurrent pneumonia in the past. He reports that he was hospitalized with sepsis secondary to a UTI in August 2017. He reports he has had problems with his asthma since that hospitalization. He is still experiencing dyspnea as well as a mostly nonproductive cough. His cough will occasional produce a dark tan phlegm. He has seen Allergy & Asthma nad was switched from [facility] to Symbicort. He has also been treated with "short courses" of Prednisone with his last course ending this weekend. He has also been using Mucinex and Tessalon Perles as well as a prescription cough syrup to help with his cough. He reports pain in his chest/rib cage with his cough but denies any other chest pressure or tightness. He is doing rehab and the pain does seem to limit his participation. He does wake up every night with coughing & wheezing. He reports in the past he was tested for sleep apnea and diagnosed. He reports his sleep has worsened in the last year. He is waking up with a headache routinely as well as continuous fatigue. He is sleeping primarily on his back. He does nap some during the day and dozes off easily watching TV or reading. He reports he has had reflux and has been taking Nexium. He does have intermittent reflux still but denies any morning brash water taste. He reports he has had allergy testing recently and was reactive to molds, trees, grasses, & cats. His Allergist is considering a monthly shot (Nucala). He was started on Dymista nasal spray but hasn't noticed a significant change in his sinus congestion or drainage. He reports he is using his rescue albuterol inhaler 3-4 times since the first of the month.   Review of Systems  No rashes or bruising. He reports diffuse joint pain in his hips,  knees, ankles, wrists, spine, elbows, & shoulders. He reports he has had swelling in his left knee with fluid removal as well as swelling in his toes. He reports stiffness in his hands lasting moments in the morning. No Raynaud's. A pertinent 14 point review of systems is negative except as per the history of presenting illness.  Allergies  Allergen Reactions  . Bee Venom Swelling  . Cymbalta [Duloxetine Hcl]   . Fenofibrate     Back pain  . Ppd [Tuberculin Purified Protein Derivative]     +ppd NEG Quantferron Gold 3/13  . Verapamil     Current Outpatient Prescriptions on File Prior to Visit  Medication Sig Dispense Refill  . albuterol (PROAIR HFA) 108 (90 Base) MCG/ACT inhaler INHALE 2 PUFFS INTO THE LUNGS EVERY 4 (FOUR) HOURS AS NEEDED FOR WHEEZING OR SHORTNESS OF BREATH. 8.5 each 3  . alfuzosin (UROXATRAL) 10 MG 24 hr tablet Take 10 mg by mouth daily.      [name] anastrozole (ARIMIDEX) 1 MG tablet Take 1 mg by mouth daily.  3  . atenolol (TENORMIN) 100 MG tablet TAKE 1 TABLET BY MOUTH EVERY DAY 90 tablet 3  . Azelastine-Fluticasone (DYMISTA) 137-50 MCG/ACT SUSP Place 2 sprays into both nostrils 2 (two) times daily. 1 Bottle 5  . baclofen (LIORESAL) 10 MG tablet Take 10 mg by mouth as needed.    . benzonatate (TESSALON) 200 MG capsule TAKE ONE CAPSULE 3 TIMES  A DAY AS NEEDED FOR COUGH 90 capsule 0  . Black Cohosh 160 MG CAPS Take 160 mg by mouth daily.    . brompheniramine-pseudoephedrine-DM 30-2-10 MG/5ML syrup TAKE 5MLS BY MOUTH 3 TIMES A DAY AS NEEDED 120 mL 0  . Calcium Carbonate-Vitamin D (CALCIUM + D PO) Take 500 mg by mouth 2 (two) times daily.     . cetirizine (ZYRTEC) 10 MG tablet Take 10 mg by mouth daily.    . Cholecalciferol (VITAMIN D PO) Take 5,000 Units by mouth 4 (four) times daily.    . Cinnamon 500 MG capsule Take 500 mg by mouth daily.    [name] co-enzyme Q-10 30 MG capsule Take 30 mg by mouth 3 (three) times daily.    . cyclobenzaprine (FLEXERIL) 10 MG tablet Take 10 mg by  mouth 3 (three) times daily as needed for muscle spasms.     [name] desmopressin (DDAVP) 0.2 MG tablet Take 200 mcg by mouth at bedtime.  3  . donepezil (ARICEPT) 23 MG TABS tablet Take 23 mg by mouth at bedtime.    . Eszopiclone (ESZOPICLONE) 3 MG TABS Take 3 mg by mouth at bedtime.     . Flaxseed, Linseed, (FLAX SEED OIL) 1000 MG CAPS Take 1,000 mg by mouth daily.    [name] gabapentin (NEURONTIN) 600 MG tablet 1,200 mg 3 (three) times daily.     . Garlic 123XX123 MG TABS Take 100 mg by mouth daily.    [name] glucosamine-chondroitin 500-400 MG tablet Take 1 tablet by mouth 3 (three) times daily.    [name] guaiFENesin (MUCINEX) 600 MG 12 hr tablet Take 1,200 mg by mouth 2 (two) times daily.    . L-METHYLFOLATE CALCIUM PO Take 1 tablet by mouth daily.     . Lecithin 1200 MG CAPS Take 1 capsule by mouth daily.     [name] lidocaine (LIDODERM) 5 % Place 1 patch onto the skin daily. Remove & Discard patch within 12 hours or as directed by MD    . memantine (NAMENDA) 10 MG tablet Take 20 mg by mouth daily.     . metFORMIN (GLUCOPHAGE XR) 500 MG 24 hr tablet Take one tablet twice daily with breakfast and dinner. 180 tablet 11  . Methylfol-Algae-B12-Acetylcyst (CEREFOLIN NAC) 6-90.314-2-600 MG TABS Take 1 tablet by mouth daily.     . montelukast (SINGULAIR) 10 MG tablet Take 1 tablet (10 mg total) by mouth daily. 90 tablet 3  . Multiple Vitamins-Minerals (MULTIVITAMIN WITH MINERALS) tablet Take 1 tablet by mouth daily.    . multivitamin-lutein (OCUVITE-LUTEIN) CAPS capsule Take 1 capsule by mouth daily.    [name] oxyCODONE-acetaminophen (PERCOCET) 10-325 MG tablet Take 1 tablet by mouth every 4 (four) hours as needed for pain.    . pentosan polysulfate (ELMIRON) 100 MG capsule Take 200 mg by mouth 3 (three) times daily before meals.     . Phosphatidylserine-DHA-EPA (VAYACOG) 100-19.5-6.5 MG CAPS Take 1 capsule by mouth daily.     . pravastatin (PRAVACHOL) 40 MG tablet TAKE 1 TABLET BY MOUTH AT BEDTIME 90 tablet 2  . ramelteon (ROZEREM)  8 MG tablet Take 8 mg by mouth.    . sertraline (ZOLOFT) 50 MG tablet Take 50 mg by mouth 2 (two) times daily.     . tadalafil (CIALIS) 5 MG tablet Take 5 mg by mouth.    . triamcinolone cream (KENALOG) 0.1 % APPLY 1 APPLICATION TOPICALLY 4 (FOUR) TIMES DAILY AS NEEDED. 30 g 0  . trospium (SANCTURA) 20 MG tablet  20 mg 2 (two) times daily.   2  . zinc gluconate 50 MG tablet Take 50 mg by mouth daily.    [name] zolpidem (AMBIEN) 10 MG tablet Take 10 mg by mouth.    . famotidine (PEPCID) 20 MG tablet Take 1 tablet (20 mg total) by mouth 2 (two) times daily. 30 tablet 0  . furosemide (LASIX) 20 MG tablet Take 1 tablet (20 mg total) by mouth daily. 5 tablet 0   No current facility-administered medications on file prior to visit.     Past Medical History:  Diagnosis Date  . Anesthesia complication requiring reversal agent administration    ? from central apnea, very difficult to get off vent  . Asthma   . BPH (benign prostatic hyperplasia)   . Dementia   . Hyperlipidemia   . Hypertension   . Hypogonadism male   . IBS (irritable bowel syndrome)   . OSA (obstructive sleep apnea)   . Prostatitis     Past Surgical History:  Procedure Laterality Date  . ABDOMINAL SURGERY    . ANKLE FRACTURE SURGERY Right   . CYSTOSCOPY     [place]  . TONSILLECTOMY    . [place] RESECTION  2007  . UVULOPALATOPHARYNGOPLASTY      Family History  Problem Relation Age of Onset  . Diabetes Paternal Uncle   . Cancer Father     lymphoma, colon  . Diabetes Maternal Grandmother   . Heart disease Maternal Grandfather   . Diabetes Maternal Grandfather   . Diabetes Paternal Grandmother   . Diabetes Paternal Grandfather   . Dementia Mother   . Prostate cancer Maternal Uncle   . Lung disease Neg Hx   . Rheumatologic disease Neg Hx     Social History   Social History  . Marital status: Single    Spouse name: N/A  . Number of children: N/A  . Years of education: N/A   Occupational History  .  Dance movement psychotherapist    Social History Main Topics  . Smoking status: Former Smoker    Packs/day: 0.10    Years: 15.00    Start date: 12/14/1980    Quit date: 05/27/1995  . Smokeless tobacco: Never Used     Comment: significant second-hand exposure through mother  . Alcohol use Yes     Comment: rarely  . Drug use: No  . Sexual activity: No   Other Topics Concern  . None   Social History Narrative   [facility]:   Originally from [place]. Previously has lived in [place]. He has lived in [place], [place], & [place]. He has worked in Engineer, production. No pets currently. Brief exposure to a roommates bird Secretary/administrator) in college. No mold, asbestos, or hot tub exposure.       Objective:   Physical Exam BP 110/76 (BP Location: Left Arm, Cuff Size: Large)   Pulse 85   Ht 6' (1.829 m)   Wt (!) 357 lb (161.9 kg)   SpO2 93%   BMI 48.42 kg/m  General:  Awake. Alert. No acute distress. Morbidly Obese.  Integument:  Warm & dry. No rash on exposed skin. No bruising. Lymphatics:  No appreciated cervical or supraclavicular lymphadenoapthy. HEENT:  Moist mucus membranes. No oral ulcers. No scleral injection or icterus. Neck Circumference 19inches. S/P UPPP surgery. Cardiovascular:  Regular rate. No edema. Normal S1 & S2.  Pulmonary:  Good aeration & clear to auscultation bilaterally. Symmetric chest wall expansion. No accessory muscle use.  Abdomen: Soft. Normal bowel sounds. Protuberant. Grossly nontender. Musculoskeletal:  Normal bulk and tone. Hand grip strength 5/5 bilaterally. No joint deformity or effusion appreciated. Neurological:  CN 2-12 grossly in tact. No meningismus. Moving all 4 extremities equally. Symmetric brachioradialis deep tendon reflexes. Psychiatric:  Mood and affect congruent. Speech normal rhythm, rate & tone.   PFT 03/28/16: FVC 4.03 L (80%) FEV1 2.80 L (69%) FEV1/FVC 0.69 FEF 25-75 1.62 L (40%) negative bronchodilator response  IMAGING CXR PA/LAT  02/13/16 (personally reviewed by me): No focal opacity or effusion appreciated. No mass appreciated. Heart borderline normal in size. Mediastinum normal in contour. Low lung volumes.  LABS 03/28/16 CBC: 11.8/14.2/43.0/309 Eosinophils:  354 IgG: 583 IgA: 154 IgM: 46 IgE: 92  11/02/13 ANA:  Negative DS DNA Ab:  <1 SSA:  <1.0 SSB:  <1.0  02/24/11 ACE:  35 RF:  <10    Assessment & Plan:  55 y.o. male with history of significant secondhand smoke exposure and evidence of moderate obstruction on spirometry performed earlier this month. His clinical history is one consistent with a COPD with asthma overlap syndrome. The patient's ongoing problems with regards to coughing, breathing, and sleeping are likely multifactorial in etiology. With his untreated sleep apnea and possible silent laryngo-esophageal reflux further investigation/testing is necessary at this time. I agree with his allergist that Nucala may be a good treatment option in the future but would prefer to investigate and optimize his other medical problems first. I instructed the patient to notify my office if he had any new medical problems or questions before his next appointment.  1. COPD w/ Asthma: Patient continuing on Symbicort inhaler and Singulair. Checking full pulmonary function testing as well as 6 minute walk test before next appointment. Screening for alpha-1 antitrypsin deficiency with phenotype. 2. OSA: Unclear severity and untreated. Checking split-night sleep study. Encouraged patient that desensitization can be attempted to help him better tolerate a mask. 3. GERD: Patient continuing on Pepcid. Checking barium swallow/esophagogram. 4. Chronic Allergic Rhinitis: Managed by Allergist/Dr. [name]. Currently on Singulair, Zyrtec, & Dymista. 5. Health Maintenance: Administering Pneumovax 23 today. S/P Tdap Harrell 2015, Prevnar 06 November 1014, & Influenza Vaccine October 2017. 6. Follow-up: Return to clinic in 4 weeks or  sooner if needed.  Sonia Baller Ashok Cordia, M.D. [facility] Pager:  [phone] After 3pm or if no response, call [phone] 5:45 PM 04/10/16

## 2016-04-10 NOTE — Patient Instructions (Signed)
   Continue using your medications and inhalers as prescribed.  We will review your test results at your next appointment.  Call me if you have any questions or concern before your next appointment.  TESTS ORDERED: 1. Full PFTs 2. 6MWT on room air 3. Esophagram 4. Serum Alpha-1 antitrypsin Phenotype 5. Split Night Sleep Study

## 2016-04-12 ENCOUNTER — Ambulatory Visit
Admission: RE | Admit: 2016-04-12 | Discharge: 2016-04-12 | Disposition: A | Discharge status: Home / Self Care | Payer: BLUE CROSS/BLUE SHIELD | Source: Ambulatory Visit | Attending: Sports Medicine | Admitting: Sports Medicine

## 2016-04-12 DIAGNOSIS — G8929 Other chronic pain: Secondary | ICD-10-CM

## 2016-04-12 DIAGNOSIS — M25562 Pain in left knee: Secondary | ICD-10-CM | POA: Diagnosis not present

## 2016-04-12 DIAGNOSIS — M25462 Effusion, left knee: Secondary | ICD-10-CM

## 2016-04-14 ENCOUNTER — Ambulatory Visit (INDEPENDENT_AMBULATORY_CARE_PROVIDER_SITE_OTHER): Payer: BLUE CROSS/BLUE SHIELD | Admitting: Allergy & Immunology

## 2016-04-14 ENCOUNTER — Encounter: Payer: Self-pay | Admitting: Allergy & Immunology

## 2016-04-14 VITALS — BP 138/90 | HR 92 | Temp 99.7°F | Resp 18 | Ht 73.0 in | Wt 356.8 lb

## 2016-04-14 DIAGNOSIS — J4550 Severe persistent asthma, uncomplicated: Secondary | ICD-10-CM | POA: Diagnosis not present

## 2016-04-14 DIAGNOSIS — R5381 Other malaise: Secondary | ICD-10-CM | POA: Diagnosis not present

## 2016-04-14 DIAGNOSIS — J3089 Other allergic rhinitis: Secondary | ICD-10-CM | POA: Diagnosis not present

## 2016-04-14 DIAGNOSIS — J449 Chronic obstructive pulmonary disease, unspecified: Secondary | ICD-10-CM

## 2016-04-14 DIAGNOSIS — T781XXD Other adverse food reactions, not elsewhere classified, subsequent encounter: Secondary | ICD-10-CM | POA: Diagnosis not present

## 2016-04-14 DIAGNOSIS — M25562 Pain in left knee: Secondary | ICD-10-CM | POA: Diagnosis not present

## 2016-04-14 DIAGNOSIS — Z91030 Bee allergy status: Secondary | ICD-10-CM

## 2016-04-14 DIAGNOSIS — Z91038 Other insect allergy status: Secondary | ICD-10-CM

## 2016-04-14 DIAGNOSIS — M25542 Pain in joints of left hand: Secondary | ICD-10-CM | POA: Diagnosis not present

## 2016-04-14 DIAGNOSIS — B999 Unspecified infectious disease: Secondary | ICD-10-CM

## 2016-04-14 DIAGNOSIS — M542 Cervicalgia: Secondary | ICD-10-CM | POA: Diagnosis not present

## 2016-04-14 MED ORDER — BUDESONIDE-FORMOTEROL FUMARATE 160-4.5 MCG/ACT IN AERO: 2.0000 | INHALATION_SPRAY | Freq: Two times a day (BID) | RESPIRATORY_TRACT | 5 refills | Status: DC

## 2016-04-14 NOTE — Patient Instructions (Addendum)
1. Recurrent infections - with isolated low IgG  - Your immune workup was largely normal (normal vaccination titers, normal complement levels). - You did have a borderline low IgG but this might be related to your prednisone courses or maybe one of your prostate medications. - I continue to feel that many of your infections are related to year uncontrolled asthma and allergies.  2. Severe persistent asthma, uncomplicated - Continue with Symbicort 160/4.5 two puffs twice daily with spacer. - We will hold off on the Nucala for now until the work-up by your Pulmonologist is completed.   3. Chronic allergic rhinitis - We will give you information on allergy shots today. - You will need two vials due to all of your positive testing.  - Continue with Dymista 2 sprays per nostril 1-2 times daily. - Use cetirizine 10mg  daily as needed for breakthrough symptoms.  4. Adverse food reaction (peanuts) - Testing at the last visit was negative but since you continue to have abdominal pain with peanuts, we will get blood testing. - If the testing is positive, we will send in an EpiPen for you.  5. Return in about 3 months (around 07/15/2016).  Please inform us of any Emergency Department visits, hospitalizations, or changes in symptoms. Call us before going to the ED for breathing or allergy symptoms since we might be able to fit you in for a sick visit. Feel free to contact us anytime with any questions, problems, or concerns.  It was a pleasure to see you again today! Happy Thanksgiving!

## 2016-04-14 NOTE — Progress Notes (Signed)
FOLLOW UP  Date of Service/Encounter:  04/14/16   Assessment:   Severe persistent asthma without complication  Adverse food reaction, subsequent encounter  Recurrent infections  Chronic nonseasonal allergic rhinitis due to pollen  Asthma-COPD overlap syndrome (Mark Harrell)  Hymenoptera allergy     Asthma Reportables:  Severity: severe persistent  Risk: high Control: well controlled  Seasonal Influenza Vaccine: yes   The CDC recommends patients with persistent asthma between the ages of 19-64 years receive PPSV-23 vaccination, if this patient qualifies, has he/she received 1 dose of PPSV-23 yet? Yes    Plan/Recommendations:    1. Recurrent infections - with isolated low IgG  - Mark Harrell's immune workup was largely normal (normal vaccination titers, normal complement levels). - There was a borderline low IgG but this might be related to your prednisone courses or maybe one of your prostate medications (anastrazole). - I continue to feel that many of your infections are related to year uncontrolled asthma and allergies.  2. Severe persistent asthma, uncomplicated - holding off on Nucala - Continue with Symbicort 160/4.5 two puffs twice daily with spacer. - We will hold off on the Nucala for now until the work-up by your Pulmonologist is completed. - He would qualify with his lab findings. Mark Harrell, our biologics manager, is aware of Mark Harrell.   3. Chronic allergic rhinitis - We provided information on allergy shots today. - Mark Harrell will need two vials due to all of your positive testing.  - Continue with Dymista 2 sprays per nostril 1-2 times daily. - Use cetirizine 10mg  daily as needed for breakthrough symptoms.  4. Adverse food reaction (peanuts) - Testing at the last visit was negative but since you continue to have abdominal pain with peanuts, we will get blood testing. - If the testing is positive, we will send in an EpiPen for you.  5. Return in  about 3 months (around 07/15/2016).   Subjective:   Mark Harrell is a 55 y.o. male presenting today for follow up of  Chief Complaint  Patient presents with  . Asthma    Follow up  . Cough    getting better  .  Mark Harrell has a history of the following: Patient Active Problem List   Diagnosis Date Noted  . Recurrent infections 03/29/2016  . Chronic nonseasonal allergic rhinitis due to pollen 03/29/2016  . Glucose intolerance (impaired glucose tolerance) 01/16/2016  . Polypharmacy 01/16/2016  . BMI 47.46,   adult (Mark Harrell) 03/06/2015  . SDAT 02/05/2015  . Asthma-COPD overlap syndrome (Mark Harrell) 02/05/2015  . OSA (obstructive sleep apnea) 02/05/2015  . Medication management 08/02/2014  . GERD (gastroesophageal reflux disease) 05/09/2014  . Obesity, Class III, BMI 40-49.9 (morbid obesity) ([place]) 02/01/2014  . Vitamin D deficiency 08/01/2013  . Prediabetes 08/01/2013  . 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 Diverticula of colon 05/07/2011  . Hypertension 01/31/2011  . Hyperlipidemia 01/31/2011  . Migraine 01/31/2011  . BPH (benign prostatic hyperplasia) 01/31/2011  . Testosterone Deficiency 01/31/2011  . IBS (irritable bowel syndrome) 01/31/2011  . Partial complex seizure disorder with intractable epilepsy (Mark Harrell) 01/31/2011  . Depression, controlled 01/31/2011  . COPD with asthma ([place]) 01/31/2011    History obtained from: chart review and the patient.  Mark Harrell was referred by Mark Richards, MD.     Mark Harrell is a 55 y.o. male presenting for a follow up visit. He was last seen a couple of weeks ago after being lost to follow up from our  practice. At that time, we did an extensive workup including an immune workup which was notable for protective titers to Pneumococcus, Haemopohilus, Diphtheria, and Tetanus. CBC with normal and the CH50 was normal as well. His immunoglobulin levels were notable for a borderline low IgG as well as an IgM that was slightly below the normal. We also  maximized his asthma management with Symbicort 160/4.5 two puffs in the morning and two puffs at night. Lung testing was normal without evidence of reversibility. We treated him with a steroid taper since he was having problems with his breathing and we discussed starting Nucala (an anti-IL5 agent) for which he would certainly qualify given his AEC of 291 on 02/13/16 and an AEC of 354 on 03/28/16. He had allergy testing that showed positives to grasses, weeds, ragweed, molds, cat, dust mite, and cockroach. We changed him to Dymista 2 sprays per nostril 1-2 times daily as well as cetirizine 10mg  daily. He had reactions to peanuts, and we performed testing to the most common foods without evidence of food allergies.   Since the last visit, he has generally done well. He has remained on Symbicort which seems to have controlled his symptoms well. He has required no albuterol use and has not needed prednisone. He felt that the steroids did help his symptoms. He did see his pulmonologist - Dr. Ashok Harrell - who felt that we should try to manage his comorbities contributing to Mark Harrell asthma before starting Nucala. Therefore, he is going to have full PFTs with a six minute walk test performed as well as barium swallow with esophagram.   Mark Harrell does not feel that Dymista has provided any better relief of his nasal symptoms. He is interested in allergy shots today and would like to discuss further. He also has untreated OSA which Dr. Ashok Harrell recommended treating. He remains on Pepcid for Mark Harrell GERD. He continues to have stomach pain with peanut exposure, although the last testing via skin prick tests was negative. He did have an EpiPen at one point for his stinging insect allergy, but he has since let that EpiPen expire and he does not feel that he needs one at this time.   Otherwise, there have been no changes to his past medical history, surgical history, family history, or social history.   Review of Systems: a 14-point  review of systems is pertinent for what is mentioned in HPI.  Otherwise, all other systems were negative. Constitutional: negative other than that listed in the HPI Eyes: negative other than that listed in the HPI Ears, nose, mouth, throat, and face: negative other than that listed in the HPI Respiratory: negative other than that listed in the HPI Cardiovascular: negative other than that listed in the HPI Gastrointestinal: negative other than that listed in the HPI Genitourinary: negative other than that listed in the HPI Integument: negative other than that listed in the HPI Hematologic: negative other than that listed in the HPI Musculoskeletal: negative other than that listed in the HPI Neurological: negative other than that listed in the HPI Allergy/Immunologic: negative other than that listed in the HPI    Objective:   Blood pressure 138/90, pulse 92, temperature 99.7 F (37.6 C), temperature source Oral, resp. rate 18, height 6\' 1"  (1.854 m), weight (!) 356 lb 12.8 oz (161.8 kg), SpO2 96 %. Body mass index is 47.07 kg/m.   Physical Exam:  General: Alert, interactive, in no acute distress. Cooperative with the exam. Very gracious male. Obese. HEENT: TMs pearly gray,  turbinates edematous with clear discharge, post-pharynx erythematous. Neck: Supple without thyromegaly. Lungs: Clear to auscultation without wheezing, rhonchi or rales. Decreased breath sounds at the bases. No increased work of breathing. CV: Normal S1/S2, no murmurs. Capillary refill <2 seconds.  Abdomen: Nondistended, nontender. No guarding or rebound tenderness. Bowel sounds faint and present in all fields  Skin: Warm and dry, without lesions or rashes. Extremities:  No clubbing, cyanosis or edema. Neuro:   Grossly intact. No focal deficits.   Diagnostic studies:  Spirometry: results normal (FEV1: 2.83/68%, FVC: 3.67/71%, FEV1/FVC: 76%).    Spirometry consistent with normal pattern.   Allergy Studies:  None     [name], MD [facility]

## 2016-04-15 ENCOUNTER — Ambulatory Visit (HOSPITAL_COMMUNITY)
Admission: RE | Admit: 2016-04-15 | Discharge: 2016-04-15 | Disposition: A | Discharge status: Home / Self Care | Payer: BLUE CROSS/BLUE SHIELD | Source: Ambulatory Visit | Attending: Pulmonary Disease | Admitting: Pulmonary Disease

## 2016-04-15 ENCOUNTER — Other Ambulatory Visit: Payer: Self-pay | Admitting: Internal Medicine

## 2016-04-15 DIAGNOSIS — K224 Dyskinesia of esophagus: Secondary | ICD-10-CM | POA: Diagnosis not present

## 2016-04-15 DIAGNOSIS — J40 Bronchitis, not specified as acute or chronic: Secondary | ICD-10-CM | POA: Diagnosis not present

## 2016-04-15 DIAGNOSIS — K449 Diaphragmatic hernia without obstruction or gangrene: Secondary | ICD-10-CM | POA: Insufficient documentation

## 2016-04-15 DIAGNOSIS — K219 Gastro-esophageal reflux disease without esophagitis: Secondary | ICD-10-CM | POA: Diagnosis not present

## 2016-04-15 DIAGNOSIS — R058 Other specified cough: Secondary | ICD-10-CM

## 2016-04-15 DIAGNOSIS — R05 Cough: Secondary | ICD-10-CM

## 2016-04-15 LAB — ALPHA-1 ANTITRYPSIN PHENOTYPE: A1 ANTITRYPSIN: 155 mg/dL (ref 83–199)

## 2016-04-15 LAB — ALLERGEN, PEANUT COMPONENT PANEL
Ara h 8 (f352): <0.1 kU/L
Ara h 9 (f427: <0.1 kU/L

## 2016-04-18 ENCOUNTER — Encounter (INDEPENDENT_AMBULATORY_CARE_PROVIDER_SITE_OTHER): Payer: Self-pay | Admitting: Sports Medicine

## 2016-04-21 ENCOUNTER — Ambulatory Visit (INDEPENDENT_AMBULATORY_CARE_PROVIDER_SITE_OTHER): Payer: BLUE CROSS/BLUE SHIELD | Admitting: Sports Medicine

## 2016-04-21 ENCOUNTER — Ambulatory Visit (HOSPITAL_COMMUNITY)
Admission: RE | Admit: 2016-04-21 | Discharge: 2016-04-21 | Disposition: A | Discharge status: Home / Self Care | Payer: BLUE CROSS/BLUE SHIELD | Source: Ambulatory Visit | Attending: Pulmonary Disease | Admitting: Pulmonary Disease

## 2016-04-21 DIAGNOSIS — J449 Chronic obstructive pulmonary disease, unspecified: Secondary | ICD-10-CM | POA: Diagnosis not present

## 2016-04-21 DIAGNOSIS — J4489 Other specified chronic obstructive pulmonary disease: Secondary | ICD-10-CM

## 2016-04-21 LAB — PULMONARY FUNCTION TEST
DL/VA % pred: 117 %
DL/VA: 5.57 ml/min/mmHg/L
DLCO UNC: 33.45 ml/min/mmHg
DLCO unc % pred: 95 %
FEF 25-75 PRE: 1.95 L/s
FEF 25-75 Post: 2.67 L/sec
FEF2575-%CHANGE-POST: 37 %
FEF2575-%PRED-POST: 78 %
FEF2575-%Pred-Pre: 57 %
FEV1-%CHANGE-POST: 7 %
FEV1-%PRED-POST: 69 %
FEV1-%PRED-PRE: 64 %
FEV1-POST: 2.78 L
FEV1-Pre: 2.58 L
FEV1FVC-%Change-Post: -2 %
FEV1FVC-%Pred-Pre: 96 %
FEV6-%CHANGE-POST: 9 %
FEV6-%PRED-POST: 74 %
FEV6-%PRED-PRE: 68 %
FEV6-POST: 3.74 L
FEV6-PRE: 3.43 L
FEV6FVC-%CHANGE-POST: -1 %
FEV6FVC-%PRED-PRE: 103 %
FEV6FVC-%Pred-Post: 101 %
FVC-%CHANGE-POST: 10 %
FVC-%PRED-POST: 73 %
FVC-%Pred-Pre: 66 %
FVC-Post: 3.84 L
FVC-Pre: 3.47 L
POST FEV1/FVC RATIO: 72 %
POST FEV6/FVC RATIO: 98 %
PRE FEV6/FVC RATIO: 99 %
Pre FEV1/FVC ratio: 74 %
RV % PRED: 96 %
RV: 2.17 L
TLC % pred: 84 %
TLC: 6.21 L

## 2016-04-21 MED ORDER — ALBUTEROL SULFATE (2.5 MG/3ML) 0.083% IN NEBU
2.5000 mg | INHALATION_SOLUTION | Freq: Once | RESPIRATORY_TRACT | Status: AC
  Administered 2016-04-21: 2.5 mg via RESPIRATORY_TRACT

## 2016-04-23 ENCOUNTER — Encounter: Payer: Self-pay | Admitting: Internal Medicine

## 2016-04-23 ENCOUNTER — Ambulatory Visit (INDEPENDENT_AMBULATORY_CARE_PROVIDER_SITE_OTHER): Payer: BLUE CROSS/BLUE SHIELD | Admitting: Sports Medicine

## 2016-04-23 ENCOUNTER — Ambulatory Visit (INDEPENDENT_AMBULATORY_CARE_PROVIDER_SITE_OTHER): Payer: BLUE CROSS/BLUE SHIELD | Admitting: Internal Medicine

## 2016-04-23 ENCOUNTER — Encounter (INDEPENDENT_AMBULATORY_CARE_PROVIDER_SITE_OTHER): Payer: Self-pay | Admitting: Sports Medicine

## 2016-04-23 VITALS — BP 122/83 | HR 84 | Temp 99.7°F | Ht 73.0 in | Wt 356.0 lb

## 2016-04-23 VITALS — BP 120/84 | HR 76 | Temp 97.9°F | Resp 16 | Ht 73.0 in | Wt 362.0 lb

## 2016-04-23 DIAGNOSIS — E559 Vitamin D deficiency, unspecified: Secondary | ICD-10-CM | POA: Diagnosis not present

## 2016-04-23 DIAGNOSIS — G8929 Other chronic pain: Secondary | ICD-10-CM

## 2016-04-23 DIAGNOSIS — S83242D Other tear of medial meniscus, current injury, left knee, subsequent encounter: Secondary | ICD-10-CM

## 2016-04-23 DIAGNOSIS — Z79899 Other long term (current) drug therapy: Secondary | ICD-10-CM

## 2016-04-23 DIAGNOSIS — R300 Dysuria: Secondary | ICD-10-CM | POA: Diagnosis not present

## 2016-04-23 DIAGNOSIS — E782 Mixed hyperlipidemia: Secondary | ICD-10-CM | POA: Diagnosis not present

## 2016-04-23 DIAGNOSIS — I10 Essential (primary) hypertension: Secondary | ICD-10-CM

## 2016-04-23 DIAGNOSIS — M25462 Effusion, left knee: Secondary | ICD-10-CM

## 2016-04-23 DIAGNOSIS — M25562 Pain in left knee: Secondary | ICD-10-CM

## 2016-04-23 DIAGNOSIS — R7303 Prediabetes: Secondary | ICD-10-CM | POA: Diagnosis not present

## 2016-04-23 LAB — CBC WITH DIFFERENTIAL/PLATELET
BASOS PCT: 0 %
Basophils Absolute: 0 cells/uL (ref 0–200)
EOS PCT: 3 %
Eosinophils Absolute: 327 cells/uL (ref 15–500)
HEMATOCRIT: 48 % (ref 38.5–50.0)
HEMOGLOBIN: 15.8 g/dL (ref 13.2–17.1)
LYMPHS ABS: 3815 {cells}/uL (ref 850–3900)
Lymphocytes Relative: 35 %
MCH: 30.7 pg (ref 27.0–33.0)
MCHC: 32.9 g/dL (ref 32.0–36.0)
MCV: 93.2 fL (ref 80.0–100.0)
MONO ABS: 872 {cells}/uL (ref 200–950)
MPV: 10.3 fL (ref 7.5–12.5)
Monocytes Relative: 8 %
NEUTROS ABS: 5886 {cells}/uL (ref 1500–7800)
NEUTROS PCT: 54 %
Platelets: 286 10*3/uL (ref 140–400)
RBC: 5.15 MIL/uL (ref 4.20–5.80)
RDW: 14.3 % (ref 11.0–15.0)
WBC: 10.9 10*3/uL — AB (ref 3.8–10.8)

## 2016-04-23 LAB — HEMOGLOBIN A1C
HEMOGLOBIN A1C: 5.5 % (ref <5.7)
MEAN PLASMA GLUCOSE: 111 mg/dL

## 2016-04-23 LAB — TSH: TSH: 1.64 mIU/L (ref 0.40–4.50)
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
## 2016-04-23 NOTE — Progress Notes (Signed)
Mark Harrell - 55 y.o. male MRN [id number]  Date of birth: [birth year]  Office Visit Note: Visit Date: 04/23/2016 PCP: [name], MD Referred by: [name], MD  Subjective: Chief Complaint  Patient presents with  . Left Knee - Follow-up   HPI: Patient reports persistent ongoing pain. His knee has become swollen again but not to the point that it previously was. He has had only moderate improvement in his symptoms with aspiration injections. Pain is focally located over the medial joint line. Worse with bending & squatting. ROS: Patient has had a recent bout of multiple infections that are possibly coming from his underlying allergies according to his most recent allergist visit. & Currently denies any fevers, chills but was recently hospitalized in August for community acquired pneumonia & has underlying COPD.[name] Otherwise per HPI.   Clinical History: No specialty comments available.  He reports that he quit smoking about 20 years ago. He started smoking about 35 years ago. He has a 1.50 pack-year smoking history. He has never used smokeless tobacco.   Recent Labs  05/10/15 1545 08/13/15 1725  HGBA1C 5.5 5.7*    Assessment & Plan: Visit Diagnoses: No diagnosis found.  Plan: Given the extensive findings on his MRI of his knee arthroscopic debridement is indicated. I spoke with Dr. [name] who would like for him to obtain medical clearance prior to consultation for consideration of left knee arthroscopic debridement. This will likely need be performed in the hospital setting with a possible overnight observation. We will ask that the patient obtain medical clearance from his PCP - Dr. Melford Aase who he is seeing later today.   Follow-up: Return for ASAP with Dr. [name]. You need medical clearance from your regular doctor.  Meds: No orders of the defined types were placed in this encounter.  Orders: No orders of the defined types were placed in this  encounter.   Procedures: No notes on file   Objective:  VS:  HT:6\' 1"  (185.4 cm)   WT:(!) 356 lb (161.5 kg)  BMI:47.1    BP:122/83  HR:84bpm  TEMP:99.7 F (37.6 C)( )  RESP:  Physical Exam: Adult obese male in no acute distress. Alert & appropriate. Left knee is overall well aligned with focal medial joint line pain. He does have a mild to moderate non-tense effusion today. Reproducible click with McMurray's. Range of motion from 3 to 95. Ligamentously stable to anterior drawer, posterior drawer & varus & valgus strain. Only trace pitting edema. No overlying skin changes. Distal pulses are intact. Imaging:   Mr Knee Left W/o Contrast  Result Date: 04/13/2016 CLINICAL DATA:  Left knee pain since a twisting injury in July, 2017. EXAM: MRI OF THE LEFT KNEE WITHOUT CONTRAST TECHNIQUE: Multiplanar, multisequence MR imaging of the knee was performed. No intravenous contrast was administered. COMPARISON:  None. FINDINGS: MENISCI Medial meniscus: There is a complex tear in the posterior horn. The tear has a horizontal component reaching the femoral articular surface throughout most of the posterior. A large radial component is seen at the junction the posterior horn and body. No displaced fragment. Lateral meniscus:  Intact. LIGAMENTS Cruciates:  Intact. Collaterals:  Intact. CARTILAGE Patellofemoral:  Minimally degenerated. Medial:  Mildly thinned. Lateral:  Unremarkable. Joint:  Small joint effusion. Popliteal Fossa: Small Baker's cyst measures 1.5 cm transverse by 1.0 cm AP by 2.5 cm craniocaudal. Extensor Mechanism:  Intact. Bones: Mild marrow edema about the medial compartment is consistent with stress change. No fracture. Other: None. IMPRESSION: Complex  tear posterior horn medial meniscus includes a large radial component at the junction of the posterior horn and body and a horizontal component reaching the femoral articular surface throughout the majority of the posterior horn. Mild marrow edema  about the medial compartment is compatible with stress change. No fracture. Electronically Signed   By: Inge Rise M.D.   On: 04/13/2016 12:31    Past Medical/Family/Surgical/Social History: Medications & Allergies reviewed per EMR Patient Active Problem List   Diagnosis Date Noted  . Recurrent infections 03/29/2016  . Chronic nonseasonal allergic rhinitis due to pollen 03/29/2016  . Glucose intolerance (impaired glucose tolerance) 01/16/2016  . Polypharmacy 01/16/2016  . BMI 47.46,   adult ([place]) 03/06/2015  . SDAT 02/05/2015  . Asthma-COPD overlap syndrome ([place]) 02/05/2015  . OSA (obstructive sleep apnea) 02/05/2015  . Medication management 08/02/2014  . GERD (gastroesophageal reflux disease) 05/09/2014  . Obesity, Class III, BMI 40-49.9 (morbid obesity) ([place]) 02/01/2014  . Vitamin D deficiency 08/01/2013  . Prediabetes 08/01/2013  . 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 Diverticula of colon 05/07/2011  . Hypertension 01/31/2011  . Hyperlipidemia 01/31/2011  . Migraine 01/31/2011  . BPH (benign prostatic hyperplasia) 01/31/2011  . Testosterone Deficiency 01/31/2011  . IBS (irritable bowel syndrome) 01/31/2011  . Partial complex seizure disorder with intractable epilepsy ([place]) 01/31/2011  . Depression, controlled 01/31/2011  . COPD with asthma ([place]) 01/31/2011   Past Medical History:  Diagnosis Date  . Anesthesia complication requiring reversal agent administration    ? from central apnea, very difficult to get off vent  . Asthma   . BPH (benign prostatic hyperplasia)   . Dementia   . Hyperlipidemia   . Hypertension   . Hypogonadism male   . IBS (irritable bowel syndrome)   . OSA (obstructive sleep apnea)   . Prostatitis    Family History  Problem Relation Age of Onset  . Diabetes Paternal Uncle   . Cancer Father     lymphoma, colon  . Diabetes Maternal Grandmother   . Heart disease Maternal Grandfather   . Diabetes Maternal Grandfather   . Diabetes Paternal  Grandmother   . Diabetes Paternal Grandfather   . Dementia Mother   . Prostate cancer Maternal Uncle   . Lung disease Neg Hx   . Rheumatologic disease Neg Hx    Past Surgical History:  Procedure Laterality Date  . ABDOMINAL SURGERY    . ANKLE FRACTURE SURGERY Right   . CYSTOSCOPY     [place]  . TONSILLECTOMY    . [place] RESECTION  2007  . UVULOPALATOPHARYNGOPLASTY     Social History   Occupational History  . Dance movement psychotherapist    Social History Main Topics  . Smoking status: Former Smoker    Packs/day: 0.10    Years: 15.00    Start date: 12/14/1980    Quit date: 05/27/1995  . Smokeless tobacco: Never Used     Comment: significant second-hand exposure through mother  . Alcohol use Yes     Comment: rarely  . Drug use: No  . Sexual activity: No

## 2016-04-23 NOTE — Progress Notes (Signed)
Patient presents today for left knee MRI review. He has had multiple aspirations. He is wondering if he will need this done today.

## 2016-04-23 NOTE — Patient Instructions (Signed)

## 2016-04-24 LAB — BASIC METABOLIC PANEL WITH GFR
BUN: 12 mg/dL (ref 7–25)
CALCIUM: 9.1 mg/dL (ref 8.6–10.3)
CHLORIDE: 103 mmol/L (ref 98–110)
CO2: 24 mmol/L (ref 20–31)
Creat: 0.94 mg/dL (ref 0.70–1.33)
GFR, Est African American: >89 mL/min (ref >=60)
GLUCOSE: 138 mg/dL — AB (ref 65–99)
POTASSIUM: 4.2 mmol/L (ref 3.5–5.3)
SODIUM: 141 mmol/L (ref 135–146)

## 2016-04-24 LAB — HEPATIC FUNCTION PANEL
ALK PHOS: 55 U/L (ref 40–115)
ALT: 59 U/L — AB (ref 9–46)
AST: 35 U/L (ref 10–35)
Albumin: 3.8 g/dL (ref 3.6–5.1)
BILIRUBIN DIRECT: 0.1 mg/dL (ref <=0.2)
BILIRUBIN INDIRECT: 0.9 mg/dL (ref 0.2–1.2)
TOTAL PROTEIN: 6.5 g/dL (ref 6.1–8.1)
Total Bilirubin: 1 mg/dL (ref 0.2–1.2)

## 2016-04-24 LAB — VITAMIN D 25 HYDROXY (VIT D DEFICIENCY, FRACTURES): Vit D, 25-Hydroxy: 79 ng/mL (ref 30–100)

## 2016-04-24 LAB — URINALYSIS, ROUTINE W REFLEX MICROSCOPIC
Bilirubin Urine: NEGATIVE
Glucose, UA: NEGATIVE
Hgb urine dipstick: NEGATIVE
KETONES UR: NEGATIVE
LEUKOCYTES UA: NEGATIVE
NITRITE: NEGATIVE
Protein, ur: NEGATIVE
SPECIFIC GRAVITY, URINE: 1.023 (ref 1.001–1.035)
pH: 5 (ref 5.0–8.0)

## 2016-04-24 LAB — LIPID PANEL
CHOL/HDL RATIO: 7 ratio — AB (ref <5.0)
CHOLESTEROL: 182 mg/dL (ref <200)
HDL: 26 mg/dL — ABNORMAL LOW (ref >40)
Triglycerides: 538 mg/dL — ABNORMAL HIGH (ref <150)

## 2016-04-24 LAB — MAGNESIUM: Magnesium: 1.9 mg/dL (ref 1.5–2.5)

## 2016-04-24 LAB — INSULIN, RANDOM: Insulin: 126.7 u[IU]/mL — ABNORMAL HIGH (ref 2.0–19.6)

## 2016-04-26 LAB — URINE CULTURE

## 2016-04-27 ENCOUNTER — Other Ambulatory Visit: Payer: Self-pay | Admitting: Internal Medicine

## 2016-04-27 DIAGNOSIS — N3000 Acute cystitis without hematuria: Secondary | ICD-10-CM

## 2016-04-27 MED ORDER — AMOXICILLIN 250 MG PO CAPS: ORAL_CAPSULE | ORAL | 0 refills | Status: AC

## 2016-04-28 ENCOUNTER — Encounter: Payer: Self-pay | Admitting: Internal Medicine

## 2016-04-28 ENCOUNTER — Other Ambulatory Visit: Payer: Self-pay | Admitting: Internal Medicine

## 2016-04-28 DIAGNOSIS — J4550 Severe persistent asthma, uncomplicated: Secondary | ICD-10-CM | POA: Diagnosis not present

## 2016-04-29 ENCOUNTER — Other Ambulatory Visit: Payer: Self-pay | Admitting: Internal Medicine

## 2016-04-29 ENCOUNTER — Other Ambulatory Visit: Payer: Self-pay | Admitting: Physician Assistant

## 2016-04-29 NOTE — Progress Notes (Signed)
LMTCB

## 2016-04-30 ENCOUNTER — Ambulatory Visit (INDEPENDENT_AMBULATORY_CARE_PROVIDER_SITE_OTHER): Payer: BLUE CROSS/BLUE SHIELD | Admitting: Orthopaedic Surgery

## 2016-04-30 DIAGNOSIS — S83242D Other tear of medial meniscus, current injury, left knee, subsequent encounter: Secondary | ICD-10-CM | POA: Diagnosis not present

## 2016-04-30 NOTE — Progress Notes (Signed)
Office Visit Note   Patient: Mark Harrell           Date of Birth: 1960-10-02           MRN: [id number] Visit Date: 04/30/2016              Requested by: [name], MD [address] [place], [place] [address] PCP: [name], MD   Assessment & Plan: Visit Diagnoses:  1. Acute medial meniscal tear, left, subsequent encounter     Plan: Given the locking and catching in his knee as well as continued pain with pivoting activities and MRI is warranted to perform a partial medial meniscectomy. His MRI findings do correlate with a significant meniscal tear as well. He has excellent cartilage in his knee. He does wish to have the surgery set up soon. With thorough discussion of the risk and benefits of the surgery. We would see him back in one week postoperative for suture removal. I showed him a knee model and explained in detail with the surgery involves.  Follow-Up Instructions: Return for 1 week post-op.   Orders:  No orders of the defined types were placed in this encounter.  No orders of the defined types were placed in this encounter.     Procedures: No procedures performed   Clinical Data: No additional findings.   Subjective: Chief Complaint  Patient presents with  . Left Knee - Follow-up    Per Paulla Fore patient seeing you to discuss knee scope.   He is someone is sent to me by Dr. Paulla Fore to discuss a left knee arthroscopy due to an acute complex medial meniscal tear of his left knee. He reports locking catching at knee as well as continued swelling. He has problems with pivoting activities as well.  HPI  Review of Systems Early denies any headache, shortness of breath, chest pain, fever, chills, nausea, vomiting. He has seen his primary care physician recently and gotten cleared for this surgery.  Objective: Vital Signs: There were no vitals taken for this visit.  Physical Exam He is alert and oriented 3. Ortho Exam Examination of  his left knee shows a mild effusion. He has positive Murray sign to the medial side. He has medial joint line tenderness as well. When she started flexion pot past 90 he gets significant pain on the medial compartment of his left knee. His Lachman's exam is negative. Specialty Comments:  No specialty comments available.  Imaging: No results found. Did review the MRI of his left knee that shows a complex posterior horn and mid body needle meniscal tear of the left knee. His cartilage is surprisingly intact.  PMFS History: Patient Active Problem List   Diagnosis Date Noted  . Acute medial meniscal tear, left, subsequent encounter 04/23/2016  . Recurrent infections 03/29/2016  . Chronic nonseasonal allergic rhinitis due to pollen 03/29/2016  . Glucose intolerance (impaired glucose tolerance) 01/16/2016  . Polypharmacy 01/16/2016  . BMI 47.46,   adult ([place]) 03/06/2015  . SDAT 02/05/2015  . Asthma-COPD overlap syndrome ([place]) 02/05/2015  . OSA (obstructive sleep apnea) 02/05/2015  . Medication management 08/02/2014  . GERD (gastroesophageal reflux disease) 05/09/2014  . Obesity, Class III, BMI 40-49.9 (morbid obesity) ([place]) 02/01/2014  . Vitamin D deficiency 08/01/2013  . Prediabetes 08/01/2013  . 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 Diverticula of colon 05/07/2011  . Hypertension 01/31/2011  . Hyperlipidemia 01/31/2011  . Migraine 01/31/2011  . BPH (benign prostatic hyperplasia) 01/31/2011  . Testosterone Deficiency  01/31/2011  . IBS (irritable bowel syndrome) 01/31/2011  . Partial complex seizure disorder with intractable epilepsy ([place]) 01/31/2011  . Depression, controlled 01/31/2011  . COPD with asthma ([place]) 01/31/2011   Past Medical History:  Diagnosis Date  . Anesthesia complication requiring reversal agent administration    ? from central apnea, very difficult to get off vent  . Asthma   . BPH (benign prostatic hyperplasia)   . Dementia   . Hyperlipidemia   . Hypertension   .  Hypogonadism male   . IBS (irritable bowel syndrome)   . OSA (obstructive sleep apnea)   . Prostatitis     Family History  Problem Relation Age of Onset  . Diabetes Paternal Uncle   . Cancer Father     lymphoma, colon  . Diabetes Maternal Grandmother   . Heart disease Maternal Grandfather   . Diabetes Maternal Grandfather   . Diabetes Paternal Grandmother   . Diabetes Paternal Grandfather   . Dementia Mother   . Prostate cancer Maternal Uncle   . Lung disease Neg Hx   . Rheumatologic disease Neg Hx     Past Surgical History:  Procedure Laterality Date  . ABDOMINAL SURGERY    . ANKLE FRACTURE SURGERY Right   . CYSTOSCOPY     [place]  . TONSILLECTOMY    . [place] RESECTION  2007  . UVULOPALATOPHARYNGOPLASTY     Social History   Occupational History  . Dance movement psychotherapist    Social History Main Topics  . Smoking status: Former Smoker    Packs/day: 0.10    Years: 15.00    Start date: 12/14/1980    Quit date: 05/27/1995  . Smokeless tobacco: Never Used     Comment: significant second-hand exposure through mother  . Alcohol use Yes     Comment: rarely  . Drug use: No  . Sexual activity: No

## 2016-05-07 ENCOUNTER — Telehealth: Payer: Self-pay | Admitting: Pulmonary Disease

## 2016-05-07 ENCOUNTER — Ambulatory Visit (HOSPITAL_BASED_OUTPATIENT_CLINIC_OR_DEPARTMENT_OTHER): Payer: BLUE CROSS/BLUE SHIELD | Attending: Pulmonary Disease | Admitting: Pulmonary Disease

## 2016-05-07 VITALS — Ht 72.0 in | Wt 357.0 lb

## 2016-05-07 DIAGNOSIS — Z79899 Other long term (current) drug therapy: Secondary | ICD-10-CM | POA: Diagnosis not present

## 2016-05-07 DIAGNOSIS — G4733 Obstructive sleep apnea (adult) (pediatric): Secondary | ICD-10-CM | POA: Diagnosis not present

## 2016-05-07 NOTE — Telephone Encounter (Signed)
lmtcb X1 for pt.  It looks like this call was to confirm patient's appt?

## 2016-05-08 ENCOUNTER — Ambulatory Visit (INDEPENDENT_AMBULATORY_CARE_PROVIDER_SITE_OTHER): Payer: BLUE CROSS/BLUE SHIELD | Admitting: Pulmonary Disease

## 2016-05-08 ENCOUNTER — Encounter: Payer: Self-pay | Admitting: Pulmonary Disease

## 2016-05-08 VITALS — BP 152/90 | HR 73 | Ht 72.0 in | Wt 365.0 lb

## 2016-05-08 DIAGNOSIS — J309 Allergic rhinitis, unspecified: Secondary | ICD-10-CM | POA: Diagnosis not present

## 2016-05-08 DIAGNOSIS — K219 Gastro-esophageal reflux disease without esophagitis: Secondary | ICD-10-CM

## 2016-05-08 DIAGNOSIS — J449 Chronic obstructive pulmonary disease, unspecified: Secondary | ICD-10-CM

## 2016-05-08 DIAGNOSIS — G4733 Obstructive sleep apnea (adult) (pediatric): Secondary | ICD-10-CM | POA: Diagnosis not present

## 2016-05-08 NOTE — Patient Instructions (Signed)
   Continue using your inhalers and medications as prescribed.  Call me if you have any new breathing problems or questions before your next appointment.  We will contact you with your sleep study results once they have been completed.  I will see you back in 2 months to see how you are doing on your CPAP or sooner if needed.

## 2016-05-08 NOTE — Progress Notes (Signed)
Test reviewed.  

## 2016-05-08 NOTE — Progress Notes (Signed)
Subjective:    Patient ID: Mark Harrell, male    DOB: 14-Sep-1960, 55 y.o.   MRN: [id number]  C.C.:  Follow-up COPD with Asthma, OSA, GERD, & Chronic Allergic Rhinitis.   HPI COPD w/ Asthma: Prescribed Symbicort inhaler and Singulair. He does feel his dyspnea is slowly improving. He reports still some intermittent coughing & wheezing. This too seems to be improving. He reports rare use of his rescue medication. No exacerbation since last appointment. He reports he has awoken with coughing a couple of times since last appointment.   OSA: Unclear severity and untreated. Split-night sleep study complete and result pending. He reports his testing went ok. He reports his sleep was more restful with the CPAP during the second half.   GERD: Prescribed Pepcid. Small sliding type hiatal hernia. No reflux, dyspepsia, or morning brash water taste.   Chronic Allergic Rhinitis: Managed by Allergist/Dr. [name]. Currently on Singulair, Zyrtec, & Dymista. Reports still some sinus congestion & drainage.  Review of Systems No chest pain or pressure. No fever or chills. Does have intermittent sweats. No rashes or bruising.   Allergies  Allergen Reactions  . Bee Venom Swelling  . Cymbalta [Duloxetine Hcl]   . Fenofibrate     Back pain  . Ppd [Tuberculin Purified Protein Derivative]     +ppd NEG Quantferron Gold 3/13  . Verapamil     Current Outpatient Prescriptions on File Prior to Visit  Medication Sig Dispense Refill  . albuterol (PROAIR HFA) 108 (90 Base) MCG/ACT inhaler INHALE 2 PUFFS INTO THE LUNGS EVERY 4 (FOUR) HOURS AS NEEDED FOR WHEEZING OR SHORTNESS OF BREATH. 8.5 each 3  . alfuzosin (UROXATRAL) 10 MG 24 hr tablet Take 10 mg by mouth daily.      [name] amoxicillin (AMOXIL) 250 MG capsule Take 1 capsule 3 x/day for urinary tract infection 63 capsule 0  . anastrozole (ARIMIDEX) 1 MG tablet Take 1 mg by mouth daily.  3  . atenolol (TENORMIN) 100 MG tablet TAKE 1 TABLET BY MOUTH EVERY DAY 90  tablet 3  . baclofen (LIORESAL) 10 MG tablet Take 10 mg by mouth as needed.    . benzonatate (TESSALON) 200 MG capsule TAKE ONE CAPSULE 3 TIMES A DAY AS NEEDED FOR COUGH 90 capsule 0  . brompheniramine-pseudoephedrine-DM 30-2-10 MG/5ML syrup TAKE 5MLS BY MOUTH 3 TIMES A DAY AS NEEDED 120 mL 0  . budesonide-formoterol (SYMBICORT) 160-4.5 MCG/ACT inhaler Inhale 2 puffs into the lungs 2 (two) times daily. 1 Inhaler 5  . Calcium Carbonate-Vitamin D (CALCIUM + D PO) Take 500 mg by mouth 2 (two) times daily.     . cetirizine (ZYRTEC) 10 MG tablet Take 10 mg by mouth daily.    . Cholecalciferol (VITAMIN D PO) Take 5,000 Units by mouth 4 (four) times daily.    . Cinnamon 500 MG capsule Take 500 mg by mouth daily.    . cyclobenzaprine (FLEXERIL) 10 MG tablet Take 10 mg by mouth 3 (three) times daily as needed for muscle spasms.     [name] desmopressin (DDAVP) 0.2 MG tablet Take 200 mcg by mouth at bedtime.  3  . donepezil (ARICEPT) 23 MG TABS tablet Take 23 mg by mouth at bedtime.    . DYMISTA 137-50 MCG/ACT SUSP PLACE 2 SPRAYS INTO BOTH NOSTRILS 2 (TWO) TIMES DAILY.  5  . esomeprazole (NEXIUM) 20 MG capsule TAKE 1 CAPSULE (20 MG TOTAL) BY MOUTH DAILY AT 12 NOON. 90 capsule 1  . gabapentin (NEURONTIN) 600 MG  tablet 1,200 mg 3 (three) times daily.     [name] guaiFENesin (MUCINEX) 600 MG 12 hr tablet Take 1,200 mg by mouth 2 (two) times daily.    . L-METHYLFOLATE CALCIUM PO Take 1 tablet by mouth daily.     . memantine (NAMENDA) 10 MG tablet Take 20 mg by mouth daily.     . metFORMIN (GLUCOPHAGE XR) 500 MG 24 hr tablet Take one tablet twice daily with breakfast and dinner. 180 tablet 11  . Methylfol-Algae-B12-Acetylcyst (CEREFOLIN NAC) 6-90.314-2-600 MG TABS Take 1 tablet by mouth daily.     . montelukast (SINGULAIR) 10 MG tablet Take 1 tablet (10 mg total) by mouth daily. 90 tablet 3  . Multiple Vitamins-Minerals (MULTIVITAMIN WITH MINERALS) tablet Take 1 tablet by mouth daily.    . multivitamin-lutein  (OCUVITE-LUTEIN) CAPS capsule Take 1 capsule by mouth daily.    [name] oxyCODONE-acetaminophen (PERCOCET) 10-325 MG tablet Take 1 tablet by mouth every 4 (four) hours as needed for pain.    . pentosan polysulfate (ELMIRON) 100 MG capsule Take 200 mg by mouth 3 (three) times daily before meals.     . Phosphatidylserine-DHA-EPA (VAYACOG) 100-19.5-6.5 MG CAPS Take 1 capsule by mouth daily.     . pravastatin (PRAVACHOL) 40 MG tablet TAKE 1 TABLET BY MOUTH AT BEDTIME 90 tablet 2  . sertraline (ZOLOFT) 50 MG tablet Take 50 mg by mouth 2 (two) times daily.     . tadalafil (CIALIS) 5 MG tablet Take 5 mg by mouth.    . triamcinolone cream (KENALOG) 0.1 % APPLY TOPICALLY 4 TIMES A DAY 30 g 1  . trospium (SANCTURA) 20 MG tablet 20 mg 2 (two) times daily.   2  . zinc gluconate 50 MG tablet Take 50 mg by mouth daily.     No current facility-administered medications on file prior to visit.     Past Medical History:  Diagnosis Date  . Anesthesia complication requiring reversal agent administration    ? from central apnea, very difficult to get off vent  . Asthma   . BPH (benign prostatic hyperplasia)   . Dementia   . Hyperlipidemia   . Hypertension   . Hypogonadism male   . IBS (irritable bowel syndrome)   . OSA (obstructive sleep apnea)   . Prostatitis     Past Surgical History:  Procedure Laterality Date  . ABDOMINAL SURGERY    . ANKLE FRACTURE SURGERY Right   . CYSTOSCOPY     [place]  . TONSILLECTOMY    . [place] RESECTION  2007  . UVULOPALATOPHARYNGOPLASTY      Family History  Problem Relation Age of Onset  . Diabetes Paternal Uncle   . Cancer Father     lymphoma, colon  . Diabetes Maternal Grandmother   . Heart disease Maternal Grandfather   . Diabetes Maternal Grandfather   . Diabetes Paternal Grandmother   . Diabetes Paternal Grandfather   . Dementia Mother   . Prostate cancer Maternal Uncle   . Lung disease Neg Hx   . Rheumatologic disease Neg Hx     Social History    Social History  . Marital status: Single    Spouse name: N/A  . Number of children: N/A  . Years of education: N/A   Occupational History  . Dance movement psychotherapist    Social History Main Topics  . Smoking status: Former Smoker    Packs/day: 0.10    Years: 15.00    Start date: 12/14/1980    Quit date:  05/27/1995  . Smokeless tobacco: Never Used     Comment: significant second-hand exposure through mother  . Alcohol use Yes     Comment: rarely  . Drug use: No  . Sexual activity: No   Other Topics Concern  . None   Social History Narrative   [facility]:   Originally from [place]. Previously has lived in [place]. He has lived in [place], [place], & [place]. He has worked in Engineer, production. No pets currently. Brief exposure to a roommates bird Secretary/administrator) in college. No mold, asbestos, or hot tub exposure.       Objective:   Physical Exam BP (!) 152/90 (BP Location: Left Arm, Cuff Size: Normal)   Pulse 73   Ht 6' (1.829 m)   Wt (!) 365 lb (165.6 kg)   SpO2 92%   BMI 49.50 kg/m  General:  Awake. Morbidly obese. No acute distress Integument:  Warm & dry. No rash on exposed skin. Lymphatics:  No appreciated cervical or supraclavicular lymphadenoapthy. HEENT:  Minimal nasal turbinate swelling. No oral ulcers. Moist mucous membranes.  Cardiovascular:  Regular rate. No edema. Normal S1 & S2.  Pulmonary:  Clear bilaterally to auscultation. Good aeration bilaterally. Normal work of breathing on room air.  Abdomen: Soft. Normal bowel sounds. Protuberant. Nontender.  PFT 04/21/16: FVC 3.47 L (66%) FEV1 2.58 L (64%) FEV1/FVC 0.74 FEF 25-75 1.95 L (57%) negative bronchodilator response TLC 6.21 L (84%) RV 96% ERV 15% DLCO uncorrected 95% 03/28/16: FVC 4.03 L (80%) FEV1 2.80 L (69%) FEV1/FVC 0.69 FEF 25-75 1.62 L (40%) negative bronchodilator response  6MWT 05/08/16:  Walked 321 meters / Baseline Sat 93% on RA / Nadir Sat 93% on RA @  rest  IMAGING ESOPHAGRAM/BARIUM SWALLOW 04/15/16 (per radiologist): Mild nonspecific esophageal dysmotility disorder. Episode of flash laryngeal penetration with swallowing. Small sliding type I hiatal hernia. No visible mass, erosion, or ulcerations.  CXR PA/LAT 02/13/16 (previously reviewed by me): No focal opacity or effusion appreciated. No mass appreciated. Heart borderline normal in size. Mediastinum normal in contour. Low lung volumes.  LABS 04/10/16 Alpha-1 antitrypsin: MM (135)  03/28/16 CBC: 11.8/14.2/43.0/309 Eosinophils:  354 IgG: 583 IgA: 154 IgM: 46 IgE: 92  11/02/13 ANA:  Negative DS DNA Ab:  <1 SSA:  <1.0 SSB:  <1.0  02/24/11 ACE:  35 RF:  <10    Assessment & Plan:  55 y.o. male with history of significant secondhand smoke exposure and COPD/Asthma Overlap Syndrome. Patient's spirometry today shows no fixed airway obstruction but this is likely masked by his reduction in FVC likely secondary to his obesity. He has no evidence of any oxygen requirement. Symptomatically he seems to be continuing to improve on his current regimen therefore I am not adjusting his oral or inhaled medication therapies. I'm awaiting his sleep study results before initiating positive pressure therapy for his sleep apnea. I instructed the patient contact my office if he had any new breathing problems or questions before his next appointment.   1. COPD w/ Asthma: Continuing patient on Singulair & Symbicort. No changes at this time. 2. OSA: Currently awaiting sleep study results. 3. GERD: Continue Pepcid. No changes. 4. Chronic Allergic Rhinitis: Managed by Allergist/Dr. [name]. Currently on Singulair, Zyrtec, & Dymista. 5. Health Maintenance: S/P Pneumovax 17 April 2016, Tdap Harrell 2015, Prevnar 06 November 1014, & Influenza Vaccine October 2017. 6. Follow-up: Return to clinic in 2 months or sooner if needed.   Sonia Baller Ashok Cordia, M.D. [facility]  [facility] Pager:   [phone] After 3pm or if no response, call [phone] 4:30 PM 05/08/16

## 2016-05-09 ENCOUNTER — Other Ambulatory Visit: Payer: Self-pay | Admitting: Internal Medicine

## 2016-05-09 DIAGNOSIS — R058 Other specified cough: Secondary | ICD-10-CM

## 2016-05-09 DIAGNOSIS — R05 Cough: Secondary | ICD-10-CM

## 2016-05-09 NOTE — Telephone Encounter (Signed)
Pt came in for 12/14 for his appt.  Nothing further is needed.

## 2016-05-14 DIAGNOSIS — G4730 Sleep apnea, unspecified: Secondary | ICD-10-CM | POA: Diagnosis not present

## 2016-05-14 NOTE — Procedures (Signed)
Patient Name: Mark Harrell, Mark Harrell Date: 05/07/2016 Gender: Male D.O.B: 04/24/1961 Age (years): 55 Referring Provider: Javier Glazier Height (inches): 67 Interpreting Physician: Kara Mead MD, ABSM Weight (lbs): 360 RPSGT: Gerhard Perches BMI: 49 MRN: [id number] Neck Size: 19.50 CLINICAL INFORMATION Sleep Study Type: Split Night CPAP  Indication for sleep study: OSA  Epworth Sleepiness Score: 11  SLEEP STUDY TECHNIQUE As per the AASM Manual for the Scoring of Sleep and Associated Events v2.3 (April 2016) with a hypopnea requiring 4% desaturations.  The channels recorded and monitored were frontal, central and occipital EEG, electrooculogram (EOG), submentalis EMG (chin), nasal and oral airflow, thoracic and abdominal wall motion, anterior tibialis EMG, snore microphone, electrocardiogram, and pulse oximetry. Continuous positive airway pressure (CPAP) was initiated when the patient met split night criteria and was titrated according to treat sleep-disordered breathing.  MEDICATIONS Medications self-administered by patient taken the night of the study : CALCIUM CARBONATE- VIT D, CINNAMON, CYCLOBENZAPRINE HCL, ARICEPT, DDAVP, NEURONTIN, MUCINEX, NAMENDA, METFORMIN, SINGULAIR, ELMIRON, VAYACOG, ZOLOFT, SANCTURA  RESPIRATORY PARAMETERS Diagnostic  Total AHI (/hr): 65.7 RDI (/hr): 66.1 OA Index (/hr): 50.2 CA Index (/hr): 1.2 REM AHI (/hr): 62.9 NREM AHI (/hr): 66.8 Supine AHI (/hr): 65.7 Non-supine AHI (/hr): N/A Min O2 Sat (%): 72.00 Mean O2 (%): 89.65 Time below 88% (min): 39.4   Titration  Optimal Pressure (cm): 18 AHI at Optimal Pressure (/hr): 5.8 Min O2 at Optimal Pressure (%): 84.0 Supine % at Optimal (%): 100 Sleep % at Optimal (%): 100   SLEEP ARCHITECTURE The recording time for the entire night was 375.2 minutes.  During a baseline period of 170.9 minutes, the patient slept for 147.0 minutes in REM and nonREM, yielding a sleep efficiency of 86.0%. Sleep onset after  lights out was 9.4 minutes with a REM latency of 45.0 minutes. The patient spent 2.04% of the night in stage N1 sleep, 70.07% in stage N2 sleep, 0.00% in stage N3 and 27.89% in REM.  During the titration period of 202.1 minutes, the patient slept for 199.7 minutes in REM and nonREM, yielding a sleep efficiency of 98.8%. Sleep onset after CPAP initiation was 1.9 minutes with a REM latency of 7.5 minutes. The patient spent 0.25% of the night in stage N1 sleep, 26.90% in stage N2 sleep, 0.00% in stage N3 and 72.85% in REM.  CARDIAC DATA The 2 lead EKG demonstrated sinus rhythm. The mean heart rate was 80.23 beats per minute. Other EKG findings include: None.   LEG MOVEMENT DATA The total Periodic Limb Movements of Sleep (PLMS) were 179. The PLMS index was 30.98 .  IMPRESSIONS - Severe obstructive sleep apnea occurred during the diagnostic portion of the study (AHI = 65.7/hour). An optimal PAP pressure was selected for this patient ( 18 cm of water) - No significant central sleep apnea occurred during the diagnostic portion of the study (CAI = 1.2/hour). - Severe oxygen desaturation was noted during the diagnostic portion of the study (Min O2 = 72.00%). - The patient snored with Moderate snoring volume during the diagnostic portion of the study. - No cardiac abnormalities were noted during this study. - Clinically significant periodic limb movements did not occur during sleep.   DIAGNOSIS - Obstructive Sleep Apnea (327.23 [G47.33 ICD-10])    RECOMMENDATIONS - Trial of CPAP therapy on 18 cm H2O with a Large size [employer]kel Full Face Mask Simplus mask and heated humidification. - Alternatively auto CPAP 10-18 cm can be used if he is unable to tolerate high pressures - Avoid alcohol,  sedatives and other CNS depressants that may worsen sleep apnea and disrupt normal sleep architecture. - Sleep hygiene should be reviewed to assess factors that may improve sleep quality. - Weight management  and regular exercise should be initiated or continued. - Return to [facility] for re-evaluation after 4 weeks of therapy    Kara Mead MD Board Certified in [place]

## 2016-05-15 DIAGNOSIS — M4726 Other spondylosis with radiculopathy, lumbar region: Secondary | ICD-10-CM | POA: Insufficient documentation

## 2016-05-15 DIAGNOSIS — Z9181 History of falling: Secondary | ICD-10-CM | POA: Diagnosis not present

## 2016-05-15 DIAGNOSIS — M5416 Radiculopathy, lumbar region: Secondary | ICD-10-CM | POA: Diagnosis not present

## 2016-05-15 DIAGNOSIS — R413 Other amnesia: Secondary | ICD-10-CM | POA: Diagnosis not present

## 2016-05-22 ENCOUNTER — Telehealth: Payer: Self-pay | Admitting: Pulmonary Disease

## 2016-05-22 ENCOUNTER — Other Ambulatory Visit: Payer: Self-pay | Admitting: Internal Medicine

## 2016-05-22 DIAGNOSIS — R05 Cough: Secondary | ICD-10-CM

## 2016-05-22 DIAGNOSIS — G4733 Obstructive sleep apnea (adult) (pediatric): Secondary | ICD-10-CM

## 2016-05-22 DIAGNOSIS — R058 Other specified cough: Secondary | ICD-10-CM

## 2016-05-22 MED ORDER — AMOXICILLIN 250 MG PO CAPS: ORAL_CAPSULE | ORAL | 0 refills | Status: DC

## 2016-05-22 NOTE — Telephone Encounter (Signed)
Spoke with pt and advised of sleep study results.  Cpap order placed per Dr Ashok Cordia.  Nothing further needed.

## 2016-05-23 ENCOUNTER — Ambulatory Visit (INDEPENDENT_AMBULATORY_CARE_PROVIDER_SITE_OTHER): Payer: BLUE CROSS/BLUE SHIELD | Admitting: Allergy

## 2016-05-23 ENCOUNTER — Encounter: Payer: Self-pay | Admitting: Allergy

## 2016-05-23 ENCOUNTER — Telehealth: Payer: Self-pay | Admitting: *Deleted

## 2016-05-23 VITALS — BP 140/88 | HR 95 | Temp 99.9°F | Resp 20 | Ht 73.0 in | Wt 368.0 lb

## 2016-05-23 DIAGNOSIS — J4551 Severe persistent asthma with (acute) exacerbation: Secondary | ICD-10-CM

## 2016-05-23 DIAGNOSIS — J449 Chronic obstructive pulmonary disease, unspecified: Secondary | ICD-10-CM | POA: Diagnosis not present

## 2016-05-23 MED ORDER — AZITHROMYCIN 250 MG PO TABS: ORAL_TABLET | ORAL | 0 refills | Status: DC

## 2016-05-23 NOTE — Telephone Encounter (Signed)
Left message for patient advising him that his Mark Harrell is in our [place] office ready to start therapy and to call and make appt. I had previously left message for patient on 05/02/16 to call to start therapy

## 2016-05-23 NOTE — Progress Notes (Signed)
Follow-up Note  RE: Mark Harrell MRN: [id number] DOB: Oct 08, 1960 Date of Office Visit: 05/23/2016   History of present illness: Mark Harrell is a 55 y.o. male presenting today for sick visit. He was last seen in our office on November 20 by Dr. [name] for recurrent infections, severe persistent asthma, allergic rhinitis and adverse food reaction.  He presents today as he started off with cold-like symptoms around Christmas. Symptoms have been worsening over the past week. He is having more coughing that is not always productive however he does report that he has coughed up some white/yellow sputum. He is wheezing which is worse at night and he is not sleeping well. He has been taking Mucinex, cough medication and Tessalon Perles. He is using his Symbicort 2 puffs twice a day. He has needed to use his ProCare every 4 hours for the past week. He also continues to use Dymista and cetirizine for his allergy symptoms.  He is currently being treated for UTI with amoxicillin 250 mg 3 times a day that he has been on for the past 3 weeks. He states the total course is about a month. He has a week left.  He denies fevers/chills Or sinus pressure or pain.  His mother and uncle are also currently sick as well.   He is concerned as this summer he ended up with a UTI and pneumonia and was hospitalized with sepsis. She states this current symptoms are how he started all over the summer.     Review of systems: Review of Systems  Constitutional: Positive for malaise/fatigue. Negative for chills and fever.  HENT: Positive for congestion. Negative for ear pain, nosebleeds, sinus pain and sore throat.   Eyes: Negative for discharge and redness.  Respiratory: Positive for cough, sputum production, shortness of breath and wheezing.   Cardiovascular: Negative for chest pain.  Gastrointestinal: Negative for heartburn, nausea and vomiting.  Skin: Negative for itching and rash.    All other systems negative  unless noted above in HPI  Past medical/social/surgical/family history have been reviewed and are unchanged unless specifically indicated below.  No changes  Medication List: Allergies as of 05/23/2016      Reactions   Bee Venom Swelling   Cymbalta [duloxetine Hcl]    Fenofibrate    Back pain   Ppd [tuberculin Purified Protein Derivative]    +ppd NEG Quantferron Gold 3/13   Verapamil       Medication List       Accurate as of 05/23/16  1:50 PM. Always use your most recent med list.          albuterol 108 (90 Base) MCG/ACT inhaler Commonly known as:  PROAIR HFA INHALE 2 PUFFS INTO THE LUNGS EVERY 4 (FOUR) HOURS AS NEEDED FOR WHEEZING OR SHORTNESS OF BREATH.   alfuzosin 10 MG 24 hr tablet Commonly known as:  UROXATRAL Take 10 mg by mouth daily.   amoxicillin 250 MG capsule Commonly known as:  AMOXIL Take 1 capsule 3 x /day for infection   anastrozole 1 MG tablet Commonly known as:  ARIMIDEX Take 1 mg by mouth daily.   atenolol 100 MG tablet Commonly known as:  TENORMIN TAKE 1 TABLET BY MOUTH EVERY DAY   baclofen 10 MG tablet Commonly known as:  LIORESAL Take 10 mg by mouth as needed.   benzonatate 200 MG capsule Commonly known as:  TESSALON TAKE ONE CAPSULE 3 TIMES A DAY AS NEEDED FOR COUGH   brompheniramine-pseudoephedrine-DM 30-2-10  MG/5ML syrup TAKE 1 TEASPOONFUL 3 TIMES A DAY AS NEEDED   budesonide-formoterol 160-4.5 MCG/ACT inhaler Commonly known as:  SYMBICORT Inhale 2 puffs into the lungs 2 (two) times daily.   CALCIUM + D PO Take 500 mg by mouth 2 (two) times daily.   CEREFOLIN NAC 6-90.314-2-600 MG Tabs Take 1 tablet by mouth daily.   cetirizine 10 MG tablet Commonly known as:  ZYRTEC Take 10 mg by mouth daily.   Cinnamon 500 MG capsule Take 500 mg by mouth daily.   cyclobenzaprine 10 MG tablet Commonly known as:  FLEXERIL Take 10 mg by mouth 3 (three) times daily as needed for muscle spasms.   desmopressin 0.2 MG tablet Commonly  known as:  DDAVP Take 200 mcg by mouth at bedtime.   donepezil 23 MG Tabs tablet Commonly known as:  ARICEPT Take 23 mg by mouth at bedtime.   DYMISTA 137-50 MCG/ACT Susp Generic drug:  Azelastine-Fluticasone PLACE 2 SPRAYS INTO BOTH NOSTRILS 2 (TWO) TIMES DAILY.   esomeprazole 20 MG capsule Commonly known as:  NEXIUM TAKE 1 CAPSULE (20 MG TOTAL) BY MOUTH DAILY AT 12 NOON.   FLUARIX QUADRIVALENT 0.5 ML injection Generic drug:  Influenza vac split quadrivalent PF Inject 0.5 mg into the muscle as directed.   gabapentin 600 MG tablet Commonly known as:  NEURONTIN 1,200 mg 3 (three) times daily.   guaiFENesin 600 MG 12 hr tablet Commonly known as:  MUCINEX Take 1,200 mg by mouth 2 (two) times daily.   L-METHYLFOLATE CALCIUM PO Take 1 tablet by mouth daily.   metFORMIN 500 MG 24 hr tablet Commonly known as:  GLUCOPHAGE XR Take one tablet twice daily with breakfast and dinner.   montelukast 10 MG tablet Commonly known as:  SINGULAIR Take 1 tablet (10 mg total) by mouth daily.   multivitamin with minerals tablet Take 1 tablet by mouth daily.   multivitamin-lutein Caps capsule Take 1 capsule by mouth daily.   NAMENDA 10 MG tablet Generic drug:  memantine Take 20 mg by mouth daily.   memantine 10 MG tablet Commonly known as:  NAMENDA Take 23 mg by mouth every morning.   oxyCODONE-acetaminophen 10-325 MG tablet Commonly known as:  PERCOCET Take 1 tablet by mouth every 4 (four) hours as needed for pain.   pentosan polysulfate 100 MG capsule Commonly known as:  ELMIRON Take 200 mg by mouth 3 (three) times daily before meals.   pravastatin 40 MG tablet Commonly known as:  PRAVACHOL TAKE 1 TABLET BY MOUTH AT BEDTIME   sertraline 50 MG tablet Commonly known as:  ZOLOFT Take 50 mg by mouth 2 (two) times daily.   tadalafil 5 MG tablet Commonly known as:  CIALIS Take 5 mg by mouth.   triamcinolone cream 0.1 % Commonly known as:  KENALOG APPLY TOPICALLY 4 TIMES  A DAY   trospium 20 MG tablet Commonly known as:  SANCTURA 20 mg 2 (two) times daily.   VAYACOG 100-19.5-6.5 MG Caps Generic drug:  Phosphatidylserine-DHA-EPA Take 1 capsule by mouth daily.   VITAMIN D PO Take 5,000 Units by mouth 4 (four) times daily.   zinc gluconate 50 MG tablet Take 50 mg by mouth daily.       Known medication allergies: Allergies  Allergen Reactions  . Bee Venom Swelling  . Cymbalta [Duloxetine Hcl]   . Fenofibrate     Back pain  . Ppd [Tuberculin Purified Protein Derivative]     +ppd NEG Quantferron Gold 3/13  . Verapamil  Physical examination: Blood pressure 140/88, pulse 95, temperature 99.9 F (37.7 C), temperature source Oral, resp. rate 20, height 6\' 1"  (1.854 m), weight (!) 368 lb (166.9 kg), SpO2 94 %.  General: Alert, interactive, in no acute distress. HEENT: TMs pearly gray, turbinates mildly edematous with clear discharge, post-pharynx non erythematous. Neck: Supple without lymphadenopathy. Lungs: Decreased breath sounds with expiratory wheezing bilaterally. {no increased work of breathing. Post-DuoNeb he had improved aeration with decreased wheezing however faint wheezing still present. CV: Normal S1, S2 without murmurs. Abdomen: Nondistended, nontender. Skin: Warm and dry, without lesions or rashes. Extremities:  No clubbing, cyanosis or edema. Neuro:   Grossly intact.  Diagnositics/Labs:  Spirometry: FEV1: 2.15L  52%, FVC: 3.15L  61%, post-DuoNeb he had improvement in his FEV1 to 2.31 L or 85% which was a 7% increase  Assessment and plan:   Severe persistent asthma with COPD overlap, with exacerbation - Continue with Symbicort 160/4.5 two puffs twice daily with spacer. - Continue albuterol as needed every 4 hours for cough/wheeze/difficulty breathing  - take prednisone 40mg  x 4 days, 20mg  x 4 days, 10mg  x 2 days then stop.   - due to his ongoing amoxicillin bite course concern given his degree of symptoms despite current  antibiotic use that he may have some degree of atypical bacterial infection. Take azithromycin (z-pak) 500mg  x 1 day, 250mg  x 4 days.  This will also provide more anti-inflammatory coverage.     - continue your Amoxicillin course as prescribed by your PCP for UTI  - he can continue Tessalon Perles as needed as well as cough medicine when necessary  - advised to return in 4-6 weeks to continue his care of Dr. [name] with further discussion about resuming biologic agent for improved asthma control  Chronic allergic rhinitis - Continue with Dymista 2 sprays per nostril 1-2 times daily. - Use cetirizine 10mg  daily as needed for breakthrough symptoms. - nasal saline rinse daily prior to nasal spray use  Follow-up 4-6 weeks with Dr. [name]  I appreciate the opportunity to take part in Mark Harrell's care. Please do not hesitate to contact me with questions.  Sincerely,   Prudy Feeler, MD Allergy/Immunology [facility] [facility] [place]

## 2016-05-23 NOTE — Patient Instructions (Signed)
Severe persistent asthma, with exacerbation - Continue with Symbicort 160/4.5 two puffs twice daily with spacer. - Continue albuterol as needed every 4 hours for cough/wheeze/difficulty breathing  - take prednisone 40mg  x 4 days, 20mg  x 4 days, 10mg  x 2 days then stop.    - take azithromycin (z-pak) to cover for any atypical bacteria.     - continue your Amoxicillin course as prescribed by your PCP  Chronic allergic rhinitis - Continue with Dymista 2 sprays per nostril 1-2 times daily. - Use cetirizine 10mg  daily as needed for breakthrough symptoms.  Follow-up 4-6 weeks with Dr. [name]

## 2016-05-25 ENCOUNTER — Encounter (HOSPITAL_COMMUNITY): Payer: Self-pay | Admitting: Emergency Medicine

## 2016-05-25 ENCOUNTER — Ambulatory Visit (HOSPITAL_COMMUNITY)
Admission: EM | Admit: 2016-05-25 | Discharge: 2016-05-25 | Disposition: A | Discharge status: Home / Self Care | Payer: BLUE CROSS/BLUE SHIELD | Attending: Emergency Medicine | Admitting: Emergency Medicine

## 2016-05-25 ENCOUNTER — Ambulatory Visit (INDEPENDENT_AMBULATORY_CARE_PROVIDER_SITE_OTHER): Payer: BLUE CROSS/BLUE SHIELD

## 2016-05-25 DIAGNOSIS — J45901 Unspecified asthma with (acute) exacerbation: Secondary | ICD-10-CM

## 2016-05-25 DIAGNOSIS — R059 Cough, unspecified: Secondary | ICD-10-CM

## 2016-05-25 DIAGNOSIS — B349 Viral infection, unspecified: Secondary | ICD-10-CM

## 2016-05-25 DIAGNOSIS — R0602 Shortness of breath: Secondary | ICD-10-CM | POA: Diagnosis not present

## 2016-05-25 DIAGNOSIS — R05 Cough: Secondary | ICD-10-CM | POA: Diagnosis not present

## 2016-05-25 MED ORDER — ALBUTEROL SULFATE (2.5 MG/3ML) 0.083% IN NEBU
2.5000 mg | INHALATION_SOLUTION | Freq: Once | RESPIRATORY_TRACT | Status: AC
Start: 2016-05-25 — End: 2016-05-25
  Administered 2016-05-25: 2.5 mg via RESPIRATORY_TRACT

## 2016-05-25 MED ORDER — METHYLPREDNISOLONE ACETATE 80 MG/ML IJ SUSP
INTRAMUSCULAR | Status: AC
  Filled 2016-05-25: qty 1

## 2016-05-25 MED ORDER — ALBUTEROL SULFATE (2.5 MG/3ML) 0.083% IN NEBU
INHALATION_SOLUTION | RESPIRATORY_TRACT | Status: AC
  Filled 2016-05-25: qty 3

## 2016-05-25 MED ORDER — HYDROCODONE-HOMATROPINE 5-1.5 MG/5ML PO SYRP: 5.0000 mL | ORAL_SOLUTION | Freq: Four times a day (QID) | ORAL | 0 refills | Status: DC | PRN

## 2016-05-25 MED ORDER — IPRATROPIUM-ALBUTEROL 0.5-2.5 (3) MG/3ML IN SOLN
3.0000 mL | Freq: Once | RESPIRATORY_TRACT | Status: AC
  Administered 2016-05-25: 3 mL via RESPIRATORY_TRACT

## 2016-05-25 MED ORDER — METHYLPREDNISOLONE ACETATE 80 MG/ML IJ SUSP
80.0000 mg | Freq: Once | INTRAMUSCULAR | Status: AC
  Administered 2016-05-25: 80 mg via INTRAMUSCULAR

## 2016-05-25 MED ORDER — IPRATROPIUM-ALBUTEROL 0.5-2.5 (3) MG/3ML IN SOLN
RESPIRATORY_TRACT | Status: AC
  Filled 2016-05-25: qty 3

## 2016-05-25 NOTE — ED Triage Notes (Signed)
Was being treated for uti, finished antibiotic on Sunday 12/24.  On Tuesday started feeling like he had a cold.  On Wednesday, asthma started bothering him.  Thursday felt worse, called pcp, unable to be seen.  pcp sent in the same script he had taken for uti.  Gradually has worsened.  Saw allergy specialist on Friday 12/29. Received prednisone script and z-pac.  Patient continued amoxicillin.    Sob, feeling bad, concerned for pneumonia.  Patient has nasal congestion.  Fever on Friday, otherwise no fever per patient.  Chest congestion is worse.  Patient reports sob worsens with lying down, torso is sore.

## 2016-05-25 NOTE — Discharge Instructions (Signed)
If your symptoms continue to worsen or fail to improve you need to go to the Emergency Department for further follow up.

## 2016-05-25 NOTE — ED Provider Notes (Signed)
CSN: [id number]     Arrival date & time 05/25/16  1208 History   None    Chief Complaint  Patient presents with  . Shortness of Breath   (Consider location/radiation/quality/duration/timing/severity/associated sxs/prior Treatment)  HPI    Asian is a 55 year old male presenting today with complaints of cough congestion and chest pain following excessive coughing for the past several days. Patient states he felt something "pop" under his left rib cage yesterday evening. She reports extensive history of pneumonia asthma exacerbation and bronchitis. In addition the patient states that he is currently being treated for UTI for which she is on his second round of antibiotics. In addition the patient saw his allergist and asthma specialist this past Friday for which she was given a hand-held nebulizer treatment and started on a prednisone taper pack. Patient reports he's had intermittent low-grade fevers with cough and congestion. Denies nausea, vomiting, or diarrhea. States has had to use his rescue inhaler every 4 hours with minimal to no relief.  Past Medical History:  Diagnosis Date  . Anesthesia complication requiring reversal agent administration    ? from central apnea, very difficult to get off vent  . Asthma   . BPH (benign prostatic hyperplasia)   . Dementia   . Hyperlipidemia   . Hypertension   . Hypogonadism male   . IBS (irritable bowel syndrome)   . OSA (obstructive sleep apnea)   . Prostatitis    Past Surgical History:  Procedure Laterality Date  . ABDOMINAL SURGERY    . ANKLE FRACTURE SURGERY Right   . CYSTOSCOPY     [place]  . TONSILLECTOMY    . [place]son RESECTION  2007  . UVULOPALATOPHARYNGOPLASTY     Family History  Problem Relation Age of Onset  . Diabetes Paternal Uncle   . Cancer Father     lymphoma, colon  . Diabetes Maternal Grandmother   . Heart disease Maternal Grandfather   . Diabetes Maternal Grandfather   . Diabetes Paternal Grandmother   .  Diabetes Paternal Grandfather   . Dementia Mother   . Prostate cancer Maternal Uncle   . Lung disease Neg Hx   . Rheumatologic disease Neg Hx    Social History  Substance Use Topics  . Smoking status: Former Smoker    Packs/day: 0.10    Years: 15.00    Start date: 12/14/1980    Quit date: 05/27/1995  . Smokeless tobacco: Never Used     Comment: significant second-hand exposure through mother  . Alcohol use Yes     Comment: rarely    Review of Systems  Constitutional: Positive for chills, fatigue and fever.  HENT: Positive for congestion, postnasal drip and sinus pressure. Negative for sneezing, sore throat and trouble swallowing.   Eyes: Negative.  Negative for itching.  Respiratory: Positive for cough, shortness of breath and wheezing.   Cardiovascular: Positive for chest pain.       Pain under left rib with coughing.  Gastrointestinal: Negative.  Negative for abdominal pain, diarrhea, nausea and vomiting.  Endocrine: Negative.   Genitourinary: Negative.  Negative for decreased urine volume, difficulty urinating, dysuria and flank pain.       Denies current UTI symptoms. States he feels antibiotics have been effective.  Musculoskeletal: Negative.   Skin: Negative.  Negative for pallor and rash.  Allergic/Immunologic: Positive for environmental allergies. Negative for food allergies and immunocompromised state.  Neurological: Negative.  Negative for dizziness and weakness.  Hematological: Negative.   Psychiatric/Behavioral: Negative.  Allergies  Bee venom; Cymbalta [duloxetine hcl]; Fenofibrate; Ppd [tuberculin purified protein derivative]; and Verapamil  Home Medications   Prior to Admission medications   Medication Sig Start Date End Date Taking? Authorizing Provider  albuterol (PROAIR HFA) 108 (90 Base) MCG/ACT inhaler INHALE 2 PUFFS INTO THE LUNGS EVERY 4 (FOUR) HOURS AS NEEDED FOR WHEEZING OR SHORTNESS OF BREATH. 08/13/15   [name], MD  alfuzosin (UROXATRAL)  10 MG 24 hr tablet Take 10 mg by mouth daily.      Historical Provider, MD  amoxicillin (AMOXIL) 250 MG capsule Take 1 capsule 3 x /day for infection 05/22/16   [name], MD  anastrozole (ARIMIDEX) 1 MG tablet Take 1 mg by mouth daily. 08/08/15   Historical Provider, MD  atenolol (TENORMIN) 100 MG tablet TAKE 1 TABLET BY MOUTH EVERY DAY 09/26/15   [name], MD  azithromycin (ZITHROMAX) 250 MG tablet 2 tablets on day, then 1 tablet daily until gone. 05/23/16   Shaylar Charmian Muff, MD  baclofen (LIORESAL) 10 MG tablet Take 10 mg by mouth as needed. 03/03/16   Historical Provider, MD  benzonatate (TESSALON) 200 MG capsule TAKE ONE CAPSULE 3 TIMES A DAY AS NEEDED FOR COUGH 04/29/16   [name], MD  brompheniramine-pseudoephedrine-DM 30-2-10 MG/5ML syrup TAKE 1 TEASPOONFUL 3 TIMES A DAY AS NEEDED 05/22/16   [name], MD  budesonide-formoterol [facility]) 160-4.5 MCG/ACT inhaler Inhale 2 puffs into the lungs 2 (two) times daily. 04/14/16   [name], MD  Calcium Carbonate-Vitamin D (CALCIUM + D PO) Take 500 mg by mouth 2 (two) times daily.     Historical Provider, MD  cetirizine (ZYRTEC) 10 MG tablet Take 10 mg by mouth daily.    Historical Provider, MD  Cholecalciferol (VITAMIN D PO) Take 5,000 Units by mouth 4 (four) times daily.    Historical Provider, MD  Cinnamon 500 MG capsule Take 500 mg by mouth daily.    Historical Provider, MD  cyclobenzaprine (FLEXERIL) 10 MG tablet Take 10 mg by mouth 3 (three) times daily as needed for muscle spasms.  07/28/13   Historical Provider, MD  desmopressin (DDAVP) 0.2 MG tablet Take 200 mcg by mouth at bedtime. 04/10/14   Historical Provider, MD  donepezil (ARICEPT) 23 MG TABS tablet Take 23 mg by mouth at bedtime.    Historical Provider, MD  DYMISTA 137-50 MCG/ACT SUSP PLACE 2 SPRAYS INTO BOTH NOSTRILS 2 (TWO) TIMES DAILY. 04/10/16   Historical Provider, MD  esomeprazole (NEXIUM) 20 MG capsule TAKE 1 CAPSULE (20 MG TOTAL) BY  MOUTH DAILY AT 12 NOON. 04/29/16   [name], MD  gabapentin (NEURONTIN) 600 MG tablet 1,200 mg 3 (three) times daily.  07/28/13   Historical Provider, MD  guaiFENesin (MUCINEX) 600 MG 12 hr tablet Take 1,200 mg by mouth 2 (two) times daily.    Historical Provider, MD  HYDROcodone-homatropine (HYCODAN) 5-1.5 MG/5ML syrup Take 5 mLs by mouth every 6 (six) hours as needed for cough. 05/25/16   Nehemiah Settle, NP  Influenza vac split quadrivalent PF (FLUARIX QUADRIVALENT) 0.5 ML injection Inject 0.5 mg into the muscle as directed. 03/03/16   Historical Provider, MD  L-METHYLFOLATE CALCIUM PO Take 1 tablet by mouth daily.  11/24/13   Historical Provider, MD  memantine (NAMENDA) 10 MG tablet Take 20 mg by mouth daily.     Historical Provider, MD  memantine (NAMENDA) 10 MG tablet Take 23 mg by mouth every morning.    Historical Provider, MD  metFORMIN (GLUCOPHAGE XR) 500  MG 24 hr tablet Take one tablet twice daily with breakfast and dinner. 05/10/15 05/09/16  Courtney Forcucci, PA-C  Methylfol-Algae-B12-Acetylcyst (CEREFOLIN NAC) 6-90.314-2-600 MG TABS Take 1 tablet by mouth daily.  08/03/12   Historical Provider, MD  montelukast (SINGULAIR) 10 MG tablet Take 1 tablet (10 mg total) by mouth daily. 08/13/15   [name], MD  Multiple Vitamins-Minerals (MULTIVITAMIN WITH MINERALS) tablet Take 1 tablet by mouth daily.    Historical Provider, MD  multivitamin-lutein (OCUVITE-LUTEIN) CAPS capsule Take 1 capsule by mouth daily.    Historical Provider, MD  oxyCODONE-acetaminophen (PERCOCET) 10-325 MG tablet Take 1 tablet by mouth every 4 (four) hours as needed for pain.    Historical Provider, MD  pentosan polysulfate (ELMIRON) 100 MG capsule Take 200 mg by mouth 3 (three) times daily before meals.     Historical Provider, MD  Phosphatidylserine-DHA-EPA (VAYACOG) 100-19.5-6.5 MG CAPS Take 1 capsule by mouth daily.  07/06/14   Historical Provider, MD  pravastatin (PRAVACHOL) 40 MG tablet TAKE 1 TABLET BY  MOUTH AT BEDTIME 07/17/15   [name], MD  sertraline (ZOLOFT) 50 MG tablet Take 50 mg by mouth 2 (two) times daily.     Historical Provider, MD  tadalafil (CIALIS) 5 MG tablet Take 5 mg by mouth.    Historical Provider, MD  triamcinolone cream (KENALOG) 0.1 % APPLY TOPICALLY 4 TIMES A DAY 04/29/16   [name], MD  trospium (SANCTURA) 20 MG tablet 20 mg 2 (two) times daily.  06/22/15   Historical Provider, MD  zinc gluconate 50 MG tablet Take 50 mg by mouth daily.    Historical Provider, MD   Meds Ordered and Administered this Visit   Medications  ipratropium-albuterol (DUONEB) 0.5-2.5 (3) MG/3ML nebulizer solution 3 mL (3 mLs Nebulization Given 05/25/16 1336)  albuterol (PROVENTIL) (2.5 MG/3ML) 0.083% nebulizer solution 2.5 mg (2.5 mg Nebulization Given 05/25/16 1414)  methylPREDNISolone acetate (DEPO-MEDROL) injection 80 mg (80 mg Intramuscular Given 05/25/16 1435)    BP 138/81 (BP Location: Right Arm) Comment (BP Location): large cuff  Pulse 78   Temp 98 F (36.7 C) (Oral)   Resp 22   SpO2 94%  No data found.   Physical Exam  Constitutional: He is oriented to person, place, and time. He appears well-developed and well-nourished. No distress.  HENT:  Head: Normocephalic and atraumatic.  Right Ear: External ear normal.  Left Ear: External ear normal.  Mouth/Throat: Oropharynx is clear and moist. No oropharyngeal exudate.  Moderate redness and mucoid discharge noted in posterior next consistent with postnasal drip. Bilateral tympanic membranes pearly gray in appearance with light reflexes present and bony prominences visualized.   Eyes: Pupils are equal, round, and reactive to light. Right eye exhibits no discharge. Left eye exhibits no discharge.  Neck: Normal range of motion. Neck supple.  Negative for nuchal rigidity.  Cardiovascular: Normal rate, regular rhythm, normal heart sounds and intact distal pulses.  Exam reveals no gallop and no friction rub.   No murmur  heard. Pulmonary/Chest: Effort normal. No respiratory distress. He has no wheezes. He has no rales. He exhibits no tenderness.  Diminished air exchange heard in all lobes. Patient is in no acute distress. Patient is able to speak in full complete sentences.   Lymphadenopathy:    He has no cervical adenopathy.  Neurological: He is alert and oriented to person, place, and time.  Skin: Skin is warm and dry. He is not diaphoretic. No pallor.  Nursing note and vitals reviewed.   Urgent Care  Course   Clinical Course     Procedures (including critical care time)  Labs Review Labs Reviewed - No data to display  Imaging Review Dg Chest 2 View  Result Date: 05/25/2016 CLINICAL DATA:  Cough, shortness of breath. EXAM: CHEST  2 VIEW COMPARISON:  Radiographs of February 13, 2016. FINDINGS: Stable cardiomediastinal silhouette. No pneumothorax or pleural effusion is noted. Both lungs are clear. The visualized skeletal structures are unremarkable. IMPRESSION: No active cardiopulmonary disease. Electronically Signed   By: Marijo Conception, M.D.   On: 05/25/2016 13:17   Bilateral expiratory wheezes heard in bases following duoneb HHN.  The patient states he feels much better at this time. Patient's pulse ox on room air is now 96%.  Ordered second HHN 2.5 mg albuterol only.   Following second handheld nebulizer increased air exchange continued to be heard throughout with diminished expiratory wheezes left lower lobe. The patient states he is feeling much better and does not wish to go to emergency department for further monitoring and treatment.   CXR was discussed with Dr. Maryruth Eve.  MDM   1. Viral illness   2. Cough   3. Exacerbation of asthma, unspecified asthma severity, unspecified whether persistent    Meds ordered this encounter  Medications  . ipratropium-albuterol (DUONEB) 0.5-2.5 (3) MG/3ML nebulizer solution 3 mL  . albuterol (PROVENTIL) (2.5 MG/3ML) 0.083% nebulizer solution 2.5  mg  . methylPREDNISolone acetate (DEPO-MEDROL) injection 80 mg  . HYDROcodone-homatropine (HYCODAN) 5-1.5 MG/5ML syrup    Sig: Take 5 mLs by mouth every 6 (six) hours as needed for cough.    Dispense:  120 mL    Refill:  0     Patient does not live alone and agrees to return to the emergency department or call 911 immediately for increased asthma exacerbation. Opted to give patient steroid injection in June in addition to the current prednisone taper pack he is taking. The usual and customary discharge instructions and warnings were given.  The patient verbalizes understanding and agrees to plan of care.       Nehemiah Settle, NP 05/25/16 [phone]

## 2016-05-27 ENCOUNTER — Ambulatory Visit (INDEPENDENT_AMBULATORY_CARE_PROVIDER_SITE_OTHER): Payer: BLUE CROSS/BLUE SHIELD | Admitting: Physician Assistant

## 2016-05-27 ENCOUNTER — Encounter: Payer: Self-pay | Admitting: Physician Assistant

## 2016-05-27 VITALS — BP 140/90 | HR 94 | Temp 97.5°F | Resp 16 | Ht 73.0 in | Wt 385.0 lb

## 2016-05-27 DIAGNOSIS — J441 Chronic obstructive pulmonary disease with (acute) exacerbation: Secondary | ICD-10-CM

## 2016-05-27 MED ORDER — ALBUTEROL SULFATE (2.5 MG/3ML) 0.083% IN NEBU: 2.5000 mg | INHALATION_SOLUTION | Freq: Four times a day (QID) | RESPIRATORY_TRACT | 12 refills | Status: DC | PRN

## 2016-05-27 MED ORDER — HYDROCODONE-HOMATROPINE 5-1.5 MG/5ML PO SYRP: 5.0000 mL | ORAL_SOLUTION | Freq: Four times a day (QID) | ORAL | 0 refills | Status: DC | PRN

## 2016-05-27 MED ORDER — IPRATROPIUM-ALBUTEROL 0.5-2.5 (3) MG/3ML IN SOLN
3.0000 mL | Freq: Once | RESPIRATORY_TRACT | Status: AC
  Administered 2016-05-27: 3 mL via RESPIRATORY_TRACT

## 2016-05-27 MED ORDER — DEXAMETHASONE SODIUM PHOSPHATE 100 MG/10ML IJ SOLN
10.0000 mg | Freq: Once | INTRAMUSCULAR | Status: AC
  Administered 2016-05-27: 10 mg via INTRAMUSCULAR

## 2016-05-27 NOTE — Patient Instructions (Addendum)
Go to the ER if any CP, SOB, nausea, dizziness, severe HA, changes vision/speech   Chronic Obstructive Pulmonary Disease Chronic obstructive pulmonary disease (COPD) is a common lung condition in which airflow from the lungs is limited. COPD is a general term that can be used to describe many different lung problems that limit airflow, including both chronic bronchitis and emphysema. If you have COPD, your lung function will probably never return to normal, but there are measures you can take to improve lung function and make yourself feel better. What are the causes?  Smoking (common).  Exposure to secondhand smoke.  Genetic problems.  Chronic inflammatory lung diseases or recurrent infections. What are the signs or symptoms?  Shortness of breath, especially with physical activity.  Deep, persistent (chronic) cough with a large amount of thick mucus.  Wheezing.  Rapid breaths (tachypnea).  Gray or bluish discoloration (cyanosis) of the skin, especially in your fingers, toes, or lips.  Fatigue.  Weight loss.  Frequent infections or episodes when breathing symptoms become much worse (exacerbations).  Chest tightness. How is this diagnosed? Your health care provider will take a medical history and perform a physical examination to diagnose COPD. Additional tests for COPD may include:  Lung (pulmonary) function tests.  Chest X-ray.  CT scan.  Blood tests. How is this treated? Treatment for COPD may include:  Inhaler and nebulizer medicines. These help manage the symptoms of COPD and make your breathing more comfortable.  Supplemental oxygen. Supplemental oxygen is only helpful if you have a low oxygen level in your blood.  Exercise and physical activity. These are beneficial for nearly all people with COPD.  Lung surgery or transplant.  Nutrition therapy to gain weight, if you are underweight.  Pulmonary rehabilitation. This may involve working with a team of  health care providers and specialists, such as respiratory, occupational, and physical therapists. Follow these instructions at home:  Take all medicines (inhaled or pills) as directed by your health care provider.  Avoid over-the-counter medicines or cough syrups that dry up your airway (such as antihistamines) and slow down the elimination of secretions unless instructed otherwise by your health care provider.  If you are a smoker, the most important thing that you can do is stop smoking. Continuing to smoke will cause further lung damage and breathing trouble. Ask your health care provider for help with quitting smoking. He or she can direct you to community resources or hospitals that provide support.  Avoid exposure to irritants such as smoke, chemicals, and fumes that aggravate your breathing.  Use oxygen therapy and pulmonary rehabilitation if directed by your health care provider. If you require home oxygen therapy, ask your health care provider whether you should purchase a pulse oximeter to measure your oxygen level at home.  Avoid contact with individuals who have a contagious illness.  Avoid extreme temperature and humidity changes.  Eat healthy foods. Eating smaller, more frequent meals and resting before meals may help you maintain your strength.  Stay active, but balance activity with periods of rest. Exercise and physical activity will help you maintain your ability to do things you want to do.  Preventing infection and hospitalization is very important when you have COPD. Make sure to receive all the vaccines your health care provider recommends, especially the pneumococcal and influenza vaccines. Ask your health care provider whether you need a pneumonia vaccine.  Learn and use relaxation techniques to manage stress.  Learn and use controlled breathing techniques as directed by your  health care provider. Controlled breathing techniques include: 1. Pursed lip breathing.  Start by breathing in (inhaling) through your nose for 1 second. Then, purse your lips as if you were going to whistle and breathe out (exhale) through the pursed lips for 2 seconds. 2. Diaphragmatic breathing. Start by putting one hand on your abdomen just above your waist. Inhale slowly through your nose. The hand on your abdomen should move out. Then purse your lips and exhale slowly. You should be able to feel the hand on your abdomen moving in as you exhale.  Learn and use controlled coughing to clear mucus from your lungs. Controlled coughing is a series of short, progressive coughs. The steps of controlled coughing are: 1. Lean your head slightly forward. 2. Breathe in deeply using diaphragmatic breathing. 3. Try to hold your breath for 3 seconds. 4. Keep your mouth slightly open while coughing twice. 5. Spit any mucus out into a tissue. 6. Rest and repeat the steps once or twice as needed. Contact a health care provider if:  You are coughing up more mucus than usual.  There is a change in the color or thickness of your mucus.  Your breathing is more labored than usual.  Your breathing is faster than usual. Get help right away if:  You have shortness of breath while you are resting.  You have shortness of breath that prevents you from:  Being able to talk.  Performing your usual physical activities.  You have chest pain lasting longer than 5 minutes.  Your skin color is more cyanotic than usual.  You measure low oxygen saturations for longer than 5 minutes with a pulse oximeter. This information is not intended to replace advice given to you by your health care provider. Make sure you discuss any questions you have with your health care provider. Document Released: 02/19/2005 Document Revised: 10/18/2015 Document Reviewed: 01/06/2013 Elsevier Interactive Patient Education  2017 [employer].

## 2016-05-27 NOTE — Progress Notes (Signed)
Subjective:    Patient ID: Mark Harrell, male    DOB: [birth year], 56 y.o.   MRN: [id number]  HPI 56 y.o. obese WM presents with asthma flare/bronchitis x Christmas. Started as cold, sinus drainage, was on amoxicillin for UTI, given nebulizer treatment at allergist, on day 5 of prednisone and zpak. Then Sunday went to UC, got 2 nebulizer, hydrocodone cough syrup, has normal chest xray.   Blood pressure 140/90, pulse 94, temperature 97.5 F (36.4 C), resp. rate 16, height 6\' 1"  (1.854 m), weight (!) 385 lb (174.6 kg), SpO2 96 %.  Medications Current Outpatient Prescriptions on File Prior to Visit  Medication Sig  . albuterol (PROAIR HFA) 108 (90 Base) MCG/ACT inhaler INHALE 2 PUFFS INTO THE LUNGS EVERY 4 (FOUR) HOURS AS NEEDED FOR WHEEZING OR SHORTNESS OF BREATH.  [name] alfuzosin (UROXATRAL) 10 MG 24 hr tablet Take 10 mg by mouth daily.    [name] amoxicillin (AMOXIL) 250 MG capsule Take 1 capsule 3 x /day for infection  . anastrozole (ARIMIDEX) 1 MG tablet Take 1 mg by mouth daily.  [name] atenolol (TENORMIN) 100 MG tablet TAKE 1 TABLET BY MOUTH EVERY DAY  . baclofen (LIORESAL) 10 MG tablet Take 10 mg by mouth as needed.  . benzonatate (TESSALON) 200 MG capsule TAKE ONE CAPSULE 3 TIMES A DAY AS NEEDED FOR COUGH  . brompheniramine-pseudoephedrine-DM 30-2-10 MG/5ML syrup TAKE 1 TEASPOONFUL 3 TIMES A DAY AS NEEDED  . budesonide-formoterol (SYMBICORT) 160-4.5 MCG/ACT inhaler Inhale 2 puffs into the lungs 2 (two) times daily.  . Calcium Carbonate-Vitamin D (CALCIUM + D PO) Take 500 mg by mouth 2 (two) times daily.   . cetirizine (ZYRTEC) 10 MG tablet Take 10 mg by mouth daily.  . Cholecalciferol (VITAMIN D PO) Take 5,000 Units by mouth 4 (four) times daily.  . Cinnamon 500 MG capsule Take 500 mg by mouth daily.  . cyclobenzaprine (FLEXERIL) 10 MG tablet Take 10 mg by mouth 3 (three) times daily as needed for muscle spasms.   [name] desmopressin (DDAVP) 0.2 MG tablet Take 200 mcg by mouth at bedtime.  . donepezil  (ARICEPT) 23 MG TABS tablet Take 23 mg by mouth at bedtime.  . DYMISTA 137-50 MCG/ACT SUSP PLACE 2 SPRAYS INTO BOTH NOSTRILS 2 (TWO) TIMES DAILY.  [name] esomeprazole (NEXIUM) 20 MG capsule TAKE 1 CAPSULE (20 MG TOTAL) BY MOUTH DAILY AT 12 NOON.  [name] gabapentin (NEURONTIN) 600 MG tablet 1,200 mg 3 (three) times daily.   [name] guaiFENesin (MUCINEX) 600 MG 12 hr tablet Take 1,200 mg by mouth 2 (two) times daily.  [name] HYDROcodone-homatropine (HYCODAN) 5-1.5 MG/5ML syrup Take 5 mLs by mouth every 6 (six) hours as needed for cough.  . Influenza vac split quadrivalent PF (FLUARIX QUADRIVALENT) 0.5 ML injection Inject 0.5 mg into the muscle as directed.  . L-METHYLFOLATE CALCIUM PO Take 1 tablet by mouth daily.   . memantine (NAMENDA) 10 MG tablet Take 20 mg by mouth daily.   . memantine (NAMENDA) 10 MG tablet Take 23 mg by mouth every morning.  . metFORMIN (GLUCOPHAGE XR) 500 MG 24 hr tablet Take one tablet twice daily with breakfast and dinner.  . Methylfol-Algae-B12-Acetylcyst (CEREFOLIN NAC) 6-90.314-2-600 MG TABS Take 1 tablet by mouth daily.   . montelukast (SINGULAIR) 10 MG tablet Take 1 tablet (10 mg total) by mouth daily.  . Multiple Vitamins-Minerals (MULTIVITAMIN WITH MINERALS) tablet Take 1 tablet by mouth daily.  . multivitamin-lutein (OCUVITE-LUTEIN) CAPS capsule Take 1 capsule by mouth daily.  [name]  oxyCODONE-acetaminophen (PERCOCET) 10-325 MG tablet Take 1 tablet by mouth every 4 (four) hours as needed for pain.  . pentosan polysulfate (ELMIRON) 100 MG capsule Take 200 mg by mouth 3 (three) times daily before meals.   . Phosphatidylserine-DHA-EPA (VAYACOG) 100-19.5-6.5 MG CAPS Take 1 capsule by mouth daily.   . pravastatin (PRAVACHOL) 40 MG tablet TAKE 1 TABLET BY MOUTH AT BEDTIME  . sertraline (ZOLOFT) 50 MG tablet Take 50 mg by mouth 2 (two) times daily.   . tadalafil (CIALIS) 5 MG tablet Take 5 mg by mouth.  . triamcinolone cream (KENALOG) 0.1 % APPLY TOPICALLY 4 TIMES A DAY  . trospium (SANCTURA)  20 MG tablet 20 mg 2 (two) times daily.   [name] zinc gluconate 50 MG tablet Take 50 mg by mouth daily.   No current facility-administered medications on file prior to visit.     Problem list He has Hypertension; Hyperlipidemia; Migraine; BPH (benign prostatic hyperplasia); Testosterone Deficiency; IBS (irritable bowel syndrome); Partial complex seizure disorder with intractable epilepsy ([place]); Depression, controlled; COPD with asthma ([place]); Diverticula of colon; Positive TB test; Vitamin D deficiency; Prediabetes; Obesity, Class III, BMI 40-49.9 (morbid obesity) ([place]); GERD (gastroesophageal reflux disease); Medication management; SDAT; Asthma-COPD overlap syndrome ([place]); OSA (obstructive sleep apnea); BMI 47.46,   adult ([place]); Glucose intolerance (impaired glucose tolerance); Polypharmacy; Recurrent infections; Chronic nonseasonal allergic rhinitis due to pollen; and Acute medial meniscal tear, left, subsequent encounter on his problem list.  Review of Systems  Constitutional: Negative for chills, diaphoresis and fever.  HENT: Positive for congestion, postnasal drip, rhinorrhea, sinus pressure and sore throat. Negative for ear pain, sneezing, trouble swallowing and voice change.   Eyes: Negative.   Respiratory: Positive for cough, shortness of breath and wheezing. Negative for chest tightness.   Cardiovascular: Negative.   Gastrointestinal: Negative.   Genitourinary: Negative.   Musculoskeletal: Negative.  Negative for neck pain.  Neurological: Negative.  Negative for headaches.       Objective:   Physical Exam  Constitutional: He is oriented to person, place, and time. He appears well-developed and well-nourished.  Morbidly obese  HENT:  Head: Normocephalic and atraumatic.  Right Ear: External ear normal.  Left Ear: External ear normal.  Nose: Nose normal.  Mouth/Throat: Oropharynx is clear and moist.  Eyes: Conjunctivae are normal. Pupils are equal, round, and reactive to light.   Neck: Normal range of motion. Neck supple.  Cardiovascular: Normal rate, regular rhythm and normal heart sounds.   No murmur heard. Pulmonary/Chest: Effort normal. No respiratory distress. He has wheezes (diffusely). He has no rales. He exhibits no tenderness.  Abdominal: Soft. Bowel sounds are normal.  Lymphadenopathy:    He has no cervical adenopathy.  Neurological: He is alert and oriented to person, place, and time.  Skin: Skin is warm and dry.       Assessment & Plan:  1. COPD exacerbation ([place]) Will give hydrocodone cough syrup, do not take with oxycycodone, patient understands Dex shot, nebulizer here and will provide one to take home with medications If worsening SOB, CP go to the ER - dexamethasone (DECADRON) injection 10 mg; Inject 1 mL (10 mg total) into the muscle once. - ipratropium-albuterol (DUONEB) 0.5-2.5 (3) MG/3ML nebulizer solution 3 mL; Take 3 mLs by nebulization once.

## 2016-05-29 ENCOUNTER — Inpatient Hospital Stay (HOSPITAL_COMMUNITY)
Admission: EM | Admit: 2016-05-29 | Discharge: 2016-06-01 | DRG: 202 | Disposition: A | Discharge status: Home / Self Care | Payer: BLUE CROSS/BLUE SHIELD | Attending: Internal Medicine | Admitting: Internal Medicine

## 2016-05-29 ENCOUNTER — Encounter (HOSPITAL_COMMUNITY): Payer: Self-pay | Admitting: Emergency Medicine

## 2016-05-29 ENCOUNTER — Emergency Department (HOSPITAL_COMMUNITY): Payer: BLUE CROSS/BLUE SHIELD

## 2016-05-29 ENCOUNTER — Inpatient Hospital Stay (INDEPENDENT_AMBULATORY_CARE_PROVIDER_SITE_OTHER): Payer: BLUE CROSS/BLUE SHIELD | Admitting: Orthopaedic Surgery

## 2016-05-29 DIAGNOSIS — J45901 Unspecified asthma with (acute) exacerbation: Principal | ICD-10-CM | POA: Diagnosis present

## 2016-05-29 DIAGNOSIS — J449 Chronic obstructive pulmonary disease, unspecified: Secondary | ICD-10-CM

## 2016-05-29 DIAGNOSIS — Z87891 Personal history of nicotine dependence: Secondary | ICD-10-CM

## 2016-05-29 DIAGNOSIS — K5090 Crohn's disease, unspecified, without complications: Secondary | ICD-10-CM | POA: Diagnosis present

## 2016-05-29 DIAGNOSIS — Z7951 Long term (current) use of inhaled steroids: Secondary | ICD-10-CM

## 2016-05-29 DIAGNOSIS — J069 Acute upper respiratory infection, unspecified: Secondary | ICD-10-CM | POA: Diagnosis present

## 2016-05-29 DIAGNOSIS — F329 Major depressive disorder, single episode, unspecified: Secondary | ICD-10-CM | POA: Diagnosis present

## 2016-05-29 DIAGNOSIS — E291 Testicular hypofunction: Secondary | ICD-10-CM | POA: Diagnosis present

## 2016-05-29 DIAGNOSIS — F0390 Unspecified dementia without behavioral disturbance: Secondary | ICD-10-CM | POA: Diagnosis present

## 2016-05-29 DIAGNOSIS — G40219 Localization-related (focal) (partial) symptomatic epilepsy and epileptic syndromes with complex partial seizures, intractable, without status epilepticus: Secondary | ICD-10-CM | POA: Diagnosis present

## 2016-05-29 DIAGNOSIS — K589 Irritable bowel syndrome without diarrhea: Secondary | ICD-10-CM | POA: Diagnosis present

## 2016-05-29 DIAGNOSIS — F3341 Major depressive disorder, recurrent, in partial remission: Secondary | ICD-10-CM | POA: Diagnosis present

## 2016-05-29 DIAGNOSIS — Z6841 Body Mass Index (BMI) 40.0 and over, adult: Secondary | ICD-10-CM

## 2016-05-29 DIAGNOSIS — J4489 Other specified chronic obstructive pulmonary disease: Secondary | ICD-10-CM | POA: Diagnosis present

## 2016-05-29 DIAGNOSIS — G4733 Obstructive sleep apnea (adult) (pediatric): Secondary | ICD-10-CM | POA: Diagnosis present

## 2016-05-29 DIAGNOSIS — N400 Enlarged prostate without lower urinary tract symptoms: Secondary | ICD-10-CM | POA: Diagnosis present

## 2016-05-29 DIAGNOSIS — J441 Chronic obstructive pulmonary disease with (acute) exacerbation: Secondary | ICD-10-CM | POA: Diagnosis present

## 2016-05-29 DIAGNOSIS — Z7984 Long term (current) use of oral hypoglycemic drugs: Secondary | ICD-10-CM

## 2016-05-29 DIAGNOSIS — I10 Essential (primary) hypertension: Secondary | ICD-10-CM | POA: Diagnosis present

## 2016-05-29 DIAGNOSIS — R05 Cough: Secondary | ICD-10-CM | POA: Diagnosis not present

## 2016-05-29 DIAGNOSIS — E785 Hyperlipidemia, unspecified: Secondary | ICD-10-CM | POA: Diagnosis present

## 2016-05-29 DIAGNOSIS — E782 Mixed hyperlipidemia: Secondary | ICD-10-CM | POA: Diagnosis present

## 2016-05-29 DIAGNOSIS — R0902 Hypoxemia: Secondary | ICD-10-CM | POA: Diagnosis present

## 2016-05-29 DIAGNOSIS — Z79899 Other long term (current) drug therapy: Secondary | ICD-10-CM

## 2016-05-29 DIAGNOSIS — E119 Type 2 diabetes mellitus without complications: Secondary | ICD-10-CM | POA: Diagnosis present

## 2016-05-29 HISTORY — DX: Obesity, unspecified: E66.9

## 2016-05-29 HISTORY — DX: Gastro-esophageal reflux disease without esophagitis: K21.9

## 2016-05-29 HISTORY — DX: Unspecified osteoarthritis, unspecified site: M19.90

## 2016-05-29 HISTORY — DX: Headache: R51

## 2016-05-29 HISTORY — DX: Headache, unspecified: R51.9

## 2016-05-29 HISTORY — DX: Other complications of anesthesia, initial encounter: T88.59XA

## 2016-05-29 HISTORY — DX: Prediabetes: R73.03

## 2016-05-29 HISTORY — DX: Adverse effect of unspecified anesthetic, initial encounter: T41.45XA

## 2016-05-29 LAB — CBC WITH DIFFERENTIAL/PLATELET
Basophils Absolute: 0.1 10*3/uL (ref 0.0–0.1)
Basophils Relative: 1 %
Eosinophils Absolute: 0.1 10*3/uL (ref 0.0–0.7)
Eosinophils Relative: 1 %
HCT: 44.9 % (ref 39.0–52.0)
Hemoglobin: 14.6 g/dL (ref 13.0–17.0)
Lymphocytes Relative: 23 %
Lymphs Abs: 3.4 10*3/uL (ref 0.7–4.0)
MCH: 29.9 pg (ref 26.0–34.0)
MCHC: 32.5 g/dL (ref 30.0–36.0)
MCV: 92 fL (ref 78.0–100.0)
Monocytes Absolute: 1 10*3/uL (ref 0.1–1.0)
Monocytes Relative: 7 %
Neutro Abs: 10.2 10*3/uL — ABNORMAL HIGH (ref 1.7–7.7)
Neutrophils Relative %: 68 %
Platelets: 286 10*3/uL (ref 150–400)
RBC: 4.88 MIL/uL (ref 4.22–5.81)
RDW: 13.9 % (ref 11.5–15.5)
WBC: 14.8 10*3/uL — ABNORMAL HIGH (ref 4.0–10.5)

## 2016-05-29 LAB — RESPIRATORY PANEL BY PCR
ADENOVIRUS-RVPPCR: NOT DETECTED
Bordetella pertussis: NOT DETECTED
CHLAMYDOPHILA PNEUMONIAE-RVPPCR: NOT DETECTED
CORONAVIRUS NL63-RVPPCR: NOT DETECTED
Coronavirus 229E: NOT DETECTED
Coronavirus HKU1: NOT DETECTED
Coronavirus OC43: NOT DETECTED
INFLUENZA A-RVPPCR: NOT DETECTED
Influenza B: NOT DETECTED
Metapneumovirus: NOT DETECTED
Mycoplasma pneumoniae: NOT DETECTED
PARAINFLUENZA VIRUS 3-RVPPCR: NOT DETECTED
PARAINFLUENZA VIRUS 4-RVPPCR: DETECTED — AB
Parainfluenza Virus 1: NOT DETECTED
Parainfluenza Virus 2: NOT DETECTED
RESPIRATORY SYNCYTIAL VIRUS-RVPPCR: NOT DETECTED
RHINOVIRUS / ENTEROVIRUS - RVPPCR: NOT DETECTED

## 2016-05-29 LAB — I-STAT ARTERIAL BLOOD GAS, ED
Acid-Base Excess: 2 mmol/L (ref 0.0–2.0)
Bicarbonate: 27.8 mmol/L (ref 20.0–28.0)
O2 SAT: 95 %
PCO2 ART: 45.1 mmHg (ref 32.0–48.0)
PH ART: 7.398 (ref 7.350–7.450)
TCO2: 29 mmol/L (ref 0–100)
pO2, Arterial: 75 mmHg — ABNORMAL LOW (ref 83.0–108.0)

## 2016-05-29 LAB — GLUCOSE, CAPILLARY
GLUCOSE-CAPILLARY: 108 mg/dL — AB (ref 65–99)
Glucose-Capillary: 189 mg/dL — ABNORMAL HIGH (ref 65–99)

## 2016-05-29 LAB — BASIC METABOLIC PANEL
Anion gap: 10 (ref 5–15)
BUN: 17 mg/dL (ref 6–20)
CO2: 27 mmol/L (ref 22–32)
Calcium: 8.4 mg/dL — ABNORMAL LOW (ref 8.9–10.3)
Chloride: 101 mmol/L (ref 101–111)
Creatinine, Ser: 0.94 mg/dL (ref 0.61–1.24)
GFR calc Af Amer: >60 mL/min (ref >60)
GFR calc non Af Amer: >60 mL/min (ref >60)
Glucose, Bld: 115 mg/dL — ABNORMAL HIGH (ref 65–99)
Potassium: 3.8 mmol/L (ref 3.5–5.1)
Sodium: 138 mmol/L (ref 135–145)

## 2016-05-29 MED ORDER — PRAVASTATIN SODIUM 40 MG PO TABS
40.0000 mg | ORAL_TABLET | Freq: Every day | ORAL | Status: DC
  Administered 2016-05-29 – 2016-05-31 (×3): 40 mg via ORAL
  Filled 2016-05-29 (×3): qty 1

## 2016-05-29 MED ORDER — ALBUTEROL SULFATE (2.5 MG/3ML) 0.083% IN NEBU: 5.0000 mg | INHALATION_SOLUTION | RESPIRATORY_TRACT | Status: DC | PRN

## 2016-05-29 MED ORDER — IPRATROPIUM-ALBUTEROL 0.5-2.5 (3) MG/3ML IN SOLN
3.0000 mL | Freq: Four times a day (QID) | RESPIRATORY_TRACT | Status: DC
  Administered 2016-05-30 – 2016-06-01 (×8): 3 mL via RESPIRATORY_TRACT
  Filled 2016-05-29 (×8): qty 3

## 2016-05-29 MED ORDER — MEMANTINE HCL 10 MG PO TABS
20.0000 mg | ORAL_TABLET | Freq: Two times a day (BID) | ORAL | Status: DC
  Administered 2016-05-29 – 2016-06-01 (×6): 20 mg via ORAL
  Filled 2016-05-29 (×6): qty 2

## 2016-05-29 MED ORDER — HYDROCODONE-HOMATROPINE 5-1.5 MG/5ML PO SYRP
5.0000 mL | ORAL_SOLUTION | Freq: Four times a day (QID) | ORAL | Status: DC | PRN
  Administered 2016-05-29 – 2016-05-31 (×3): 5 mL via ORAL
  Filled 2016-05-29 (×3): qty 5

## 2016-05-29 MED ORDER — BACLOFEN 10 MG PO TABS: 10.0000 mg | ORAL_TABLET | Freq: Every day | ORAL | Status: DC | PRN

## 2016-05-29 MED ORDER — INSULIN ASPART 100 UNIT/ML ~~LOC~~ SOLN: 0.0000 [IU] | Freq: Every day | SUBCUTANEOUS | Status: DC

## 2016-05-29 MED ORDER — ONDANSETRON HCL 4 MG PO TABS
4.0000 mg | ORAL_TABLET | Freq: Four times a day (QID) | ORAL | Status: DC | PRN
Start: 2016-05-29 — End: 2016-06-01

## 2016-05-29 MED ORDER — GABAPENTIN 400 MG PO CAPS
1200.0000 mg | ORAL_CAPSULE | Freq: Three times a day (TID) | ORAL | Status: DC
  Administered 2016-05-29 – 2016-06-01 (×10): 1200 mg via ORAL
  Filled 2016-05-29 (×10): qty 3

## 2016-05-29 MED ORDER — MAGNESIUM SULFATE 2 GM/50ML IV SOLN
2.0000 g | Freq: Once | INTRAVENOUS | Status: AC
  Administered 2016-05-29: 2 g via INTRAVENOUS
  Filled 2016-05-29: qty 50

## 2016-05-29 MED ORDER — IPRATROPIUM BROMIDE 0.02 % IN SOLN
1.0000 mg | Freq: Once | RESPIRATORY_TRACT | Status: AC
  Administered 2016-05-29: 1 mg via RESPIRATORY_TRACT
  Filled 2016-05-29: qty 5

## 2016-05-29 MED ORDER — PHOSPHATIDYLSERINE-DHA-EPA 100-19.5-6.5 MG PO CAPS: 1.0000 | ORAL_CAPSULE | Freq: Two times a day (BID) | ORAL | Status: DC

## 2016-05-29 MED ORDER — CYCLOBENZAPRINE HCL 10 MG PO TABS
10.0000 mg | ORAL_TABLET | Freq: Three times a day (TID) | ORAL | Status: DC | PRN
  Administered 2016-05-31 – 2016-06-01 (×2): 10 mg via ORAL
  Filled 2016-05-29 (×2): qty 1

## 2016-05-29 MED ORDER — ENOXAPARIN SODIUM 100 MG/ML ~~LOC~~ SOLN
0.5000 mg/kg | SUBCUTANEOUS | Status: DC
  Administered 2016-05-29: 85 mg via SUBCUTANEOUS
  Filled 2016-05-29: qty 1

## 2016-05-29 MED ORDER — ONDANSETRON HCL 4 MG/2ML IJ SOLN: 4.0000 mg | Freq: Four times a day (QID) | INTRAMUSCULAR | Status: DC | PRN

## 2016-05-29 MED ORDER — ALBUTEROL SULFATE (2.5 MG/3ML) 0.083% IN NEBU
INHALATION_SOLUTION | RESPIRATORY_TRACT | Status: AC
  Filled 2016-05-29: qty 6

## 2016-05-29 MED ORDER — LEVOFLOXACIN IN D5W 500 MG/100ML IV SOLN
500.0000 mg | INTRAVENOUS | Status: DC
  Administered 2016-05-29 – 2016-06-01 (×4): 500 mg via INTRAVENOUS
  Filled 2016-05-29 (×4): qty 100

## 2016-05-29 MED ORDER — ALBUTEROL SULFATE (2.5 MG/3ML) 0.083% IN NEBU
5.0000 mg | INHALATION_SOLUTION | Freq: Once | RESPIRATORY_TRACT | Status: AC
  Administered 2016-05-29: 5 mg via RESPIRATORY_TRACT

## 2016-05-29 MED ORDER — DARIFENACIN HYDROBROMIDE ER 7.5 MG PO TB24
7.5000 mg | ORAL_TABLET | Freq: Every day | ORAL | Status: DC
  Administered 2016-05-30 – 2016-06-01 (×3): 7.5 mg via ORAL
  Filled 2016-05-29 (×3): qty 1

## 2016-05-29 MED ORDER — ENOXAPARIN SODIUM 100 MG/ML ~~LOC~~ SOLN
0.5000 mg/kg | SUBCUTANEOUS | Status: DC
  Administered 2016-05-30 – 2016-05-31 (×2): 85 mg via SUBCUTANEOUS
  Filled 2016-05-29 (×2): qty 1

## 2016-05-29 MED ORDER — DEXAMETHASONE SODIUM PHOSPHATE 10 MG/ML IJ SOLN
10.0000 mg | Freq: Once | INTRAMUSCULAR | Status: AC
  Administered 2016-05-29: 10 mg via INTRAVENOUS
  Filled 2016-05-29: qty 1

## 2016-05-29 MED ORDER — TRAZODONE HCL 50 MG PO TABS: 25.0000 mg | ORAL_TABLET | Freq: Every evening | ORAL | Status: DC | PRN

## 2016-05-29 MED ORDER — MONTELUKAST SODIUM 10 MG PO TABS
10.0000 mg | ORAL_TABLET | Freq: Every day | ORAL | Status: DC
  Administered 2016-05-29 – 2016-06-01 (×4): 10 mg via ORAL
  Filled 2016-05-29 (×5): qty 1

## 2016-05-29 MED ORDER — ANASTROZOLE 1 MG PO TABS
1.0000 mg | ORAL_TABLET | Freq: Every day | ORAL | Status: DC
  Administered 2016-05-30 – 2016-06-01 (×3): 1 mg via ORAL
  Filled 2016-05-29 (×3): qty 1

## 2016-05-29 MED ORDER — CEREFOLIN NAC 6-90.314-2-600 MG PO TABS: 1.0000 | ORAL_TABLET | Freq: Every day | ORAL | Status: DC

## 2016-05-29 MED ORDER — IPRATROPIUM BROMIDE 0.02 % IN SOLN: 0.5000 mg | RESPIRATORY_TRACT | Status: DC

## 2016-05-29 MED ORDER — BENZONATATE 100 MG PO CAPS: 200.0000 mg | ORAL_CAPSULE | ORAL | Status: DC | PRN

## 2016-05-29 MED ORDER — ALBUTEROL (5 MG/ML) CONTINUOUS INHALATION SOLN
15.0000 mg/h | INHALATION_SOLUTION | RESPIRATORY_TRACT | Status: AC
  Administered 2016-05-29: 15 mg/h via RESPIRATORY_TRACT
  Filled 2016-05-29: qty 20

## 2016-05-29 MED ORDER — ALFUZOSIN HCL ER 10 MG PO TB24
10.0000 mg | ORAL_TABLET | Freq: Every day | ORAL | Status: DC
  Administered 2016-05-29 – 2016-06-01 (×4): 10 mg via ORAL
  Filled 2016-05-29 (×4): qty 1

## 2016-05-29 MED ORDER — POLYETHYLENE GLYCOL 3350 17 G PO PACK: 17.0000 g | PACK | Freq: Every day | ORAL | Status: DC | PRN

## 2016-05-29 MED ORDER — DONEPEZIL HCL 23 MG PO TABS
23.0000 mg | ORAL_TABLET | Freq: Every day | ORAL | Status: DC
  Administered 2016-05-29 – 2016-05-31 (×3): 23 mg via ORAL
  Filled 2016-05-29 (×4): qty 1

## 2016-05-29 MED ORDER — IPRATROPIUM-ALBUTEROL 0.5-2.5 (3) MG/3ML IN SOLN
3.0000 mL | RESPIRATORY_TRACT | Status: DC
  Administered 2016-05-29 (×2): 3 mL via RESPIRATORY_TRACT
  Filled 2016-05-29 (×2): qty 3

## 2016-05-29 MED ORDER — GUAIFENESIN 100 MG/5ML PO SOLN
15.0000 mL | ORAL | Status: DC
  Administered 2016-05-29 – 2016-06-01 (×17): 300 mg via ORAL
  Filled 2016-05-29 (×3): qty 15
  Filled 2016-05-29: qty 10
  Filled 2016-05-29: qty 5
  Filled 2016-05-29 (×5): qty 15
  Filled 2016-05-29: qty 5
  Filled 2016-05-29 (×2): qty 15
  Filled 2016-05-29: qty 5
  Filled 2016-05-29 (×7): qty 15

## 2016-05-29 MED ORDER — DESMOPRESSIN ACETATE 0.2 MG PO TABS
200.0000 ug | ORAL_TABLET | Freq: Every day | ORAL | Status: DC
  Administered 2016-05-29 – 2016-05-31 (×3): 200 ug via ORAL
  Filled 2016-05-29 (×4): qty 1

## 2016-05-29 MED ORDER — INSULIN ASPART 100 UNIT/ML ~~LOC~~ SOLN
0.0000 [IU] | Freq: Three times a day (TID) | SUBCUTANEOUS | Status: DC
  Administered 2016-06-01: 2 [IU] via SUBCUTANEOUS

## 2016-05-29 MED ORDER — ATENOLOL 50 MG PO TABS
100.0000 mg | ORAL_TABLET | Freq: Every day | ORAL | Status: DC
  Administered 2016-05-29 – 2016-06-01 (×4): 100 mg via ORAL
  Filled 2016-05-29 (×4): qty 2

## 2016-05-29 MED ORDER — PANTOPRAZOLE SODIUM 40 MG PO TBEC
40.0000 mg | DELAYED_RELEASE_TABLET | Freq: Every day | ORAL | Status: DC
  Administered 2016-05-29 – 2016-06-01 (×4): 40 mg via ORAL
  Filled 2016-05-29 (×4): qty 1

## 2016-05-29 MED ORDER — SERTRALINE HCL 50 MG PO TABS
50.0000 mg | ORAL_TABLET | Freq: Two times a day (BID) | ORAL | Status: DC
  Administered 2016-05-29 – 2016-06-01 (×6): 50 mg via ORAL
  Filled 2016-05-29 (×6): qty 1

## 2016-05-29 MED ORDER — PENTOSAN POLYSULFATE SODIUM 100 MG PO CAPS
200.0000 mg | ORAL_CAPSULE | Freq: Three times a day (TID) | ORAL | Status: DC
  Administered 2016-05-29 – 2016-06-01 (×9): 200 mg via ORAL
  Filled 2016-05-29 (×13): qty 2

## 2016-05-29 NOTE — Progress Notes (Signed)
Dr. Baltazar Najjar notified patient requesting home medications for bladder: cialis 5mg  Qevening and Trospium 20mg  BID: doses needed for tonight. Awaiting response.

## 2016-05-29 NOTE — ED Notes (Signed)
Meal tray ordered for patient.

## 2016-05-29 NOTE — ED Triage Notes (Signed)
Patient reports asthma with wheezing , dry cough and chest congestion onset last week .

## 2016-05-29 NOTE — ED Notes (Signed)
Pt with hx of asthma and COPD. Recently had a sleep study and is awaiting a CPAP machine for home. C/o sob with congestion for a week. Is on a tampered dose of prednisone pack and received a steroid injection yesterday

## 2016-05-29 NOTE — ED Notes (Signed)
Ambulated pt in hallway, SpO2 sat. Dropped from 97 to 87 and stayed at 65. Notified Brooke(RN)

## 2016-05-29 NOTE — ED Notes (Signed)
Pt ate all of meal

## 2016-05-29 NOTE — H&P (Signed)
History and Physical    Mark Harrell [id number] DOB: 10-Aug-1960 DOA: 05/29/2016  PCP: [name], MD   Patient coming from: Home  Chief Complaint: progressive dyspnea and wheezing HPI: Mark Harrell is a 56 y.o. male with medical history significant of  COPD with asthma, HTN, HLD, partial complex seizure disorder, migraine headaches, fashion, diabetes mellitus, early onset dementia who presented to the emergency department with complaints of progressive shortness of breath and wheezing. Patient was seen by his PCP yesterday, received an injection of 10 mg Decadron IM and given nebulizer treatment. He was instructed to go to the emergency department if he develops worsening of wheezing shortness of breath chest pain or fever. He has been running low-grade fevers respiratory infections and UTI during the last couple of weeks. He was in the emergency room on 05/25/2016, diagnosed with viral illness and exacerbation of asthma.  ED Course: On arrival to the ED today patient was afebrile, blood pressure was somewhat accelerated-initially 157/106 mmHg, chest x-ray didn't show any acute cardiopulmonary process, blood work revealed elevated white blood cells count-14,800 Patient was given continuous nebulizer treatment for 1 hour and attempted to ambulate, but he is O2 sats dropped from 97% to 87% on room air during ambulation.  Review of Systems: As per HPI otherwise 10 point review of systems negative.    Ambulatory Status: Independent  Past Medical History:  Diagnosis Date  . Anesthesia complication requiring reversal agent administration    ? from central apnea, very difficult to get off vent  . Asthma   . BPH (benign prostatic hyperplasia)   . Dementia   . Hyperlipidemia   . Hypertension   . Hypogonadism male   . IBS (irritable bowel syndrome)   . Obesity   . OSA (obstructive sleep apnea)   . Prostatitis     Past Surgical History:  Procedure Laterality Date  . ABDOMINAL  SURGERY    . ANKLE FRACTURE SURGERY Right   . CYSTOSCOPY     [place]  . TONSILLECTOMY    . [place] RESECTION  2007  . UVULOPALATOPHARYNGOPLASTY      Social History   Social History  . Marital status: Single    Spouse name: N/A  . Number of children: N/A  . Years of education: N/A   Occupational History  . Dance movement psychotherapist    Social History Main Topics  . Smoking status: Former Smoker    Packs/day: 0.10    Years: 15.00    Start date: 12/14/1980    Quit date: 05/27/1995  . Smokeless tobacco: Never Used     Comment: significant second-hand exposure through mother  . Alcohol use Yes     Comment: rarely  . Drug use: No  . Sexual activity: No   Other Topics Concern  . Not on file   Social History Narrative   [facility]:   Originally from [place]. Previously has lived in [place]. He has lived in [place], [place], & [place]. He has worked in Engineer, production. No pets currently. Brief exposure to a roommates bird Secretary/administrator) in college. No mold, asbestos, or hot tub exposure.     Allergies  Allergen Reactions  . Bee Venom Swelling  . Cymbalta [Duloxetine Hcl] Other (See Comments)    unknown  . Fenofibrate     Back pain  . Ppd [Tuberculin Purified Protein Derivative]     +ppd NEG Quantferron Gold 3/13  . Verapamil Other (See Comments)    Back pain  Family History  Problem Relation Age of Onset  . Diabetes Paternal Uncle   . Cancer Father     lymphoma, colon  . Diabetes Maternal Grandmother   . Heart disease Maternal Grandfather   . Diabetes Maternal Grandfather   . Diabetes Paternal Grandmother   . Diabetes Paternal Grandfather   . Dementia Mother   . Prostate cancer Maternal Uncle   . Lung disease Neg Hx   . Rheumatologic disease Neg Hx     Prior to Admission medications   Medication Sig Start Date End Date Taking? Authorizing Provider  albuterol (PROAIR HFA) 108 (90 Base) MCG/ACT inhaler INHALE 2 PUFFS INTO THE LUNGS EVERY 4 (FOUR)  HOURS AS NEEDED FOR WHEEZING OR SHORTNESS OF BREATH. 08/13/15  Yes [name], MD  albuterol (PROVENTIL) (2.5 MG/3ML) 0.083% nebulizer solution Take 3 mLs (2.5 mg total) by nebulization every 6 (six) hours as needed for wheezing or shortness of breath. 05/27/16  Yes Vicie Mutters, PA-C  alfuzosin (UROXATRAL) 10 MG 24 hr tablet Take 10 mg by mouth daily.     Yes Historical Provider, MD  amoxicillin (AMOXIL) 250 MG capsule Take 1 capsule 3 x /day for infection 05/22/16  Yes [name], MD  anastrozole (ARIMIDEX) 1 MG tablet Take 1 mg by mouth daily. 08/08/15  Yes Historical Provider, MD  atenolol (TENORMIN) 100 MG tablet TAKE 1 TABLET BY MOUTH EVERY DAY 09/26/15  Yes [name], MD  baclofen (LIORESAL) 10 MG tablet Take 10 mg by mouth daily as needed for muscle spasms.  03/03/16  Yes Historical Provider, MD  benzonatate (TESSALON) 200 MG capsule TAKE ONE CAPSULE 3 TIMES A DAY AS NEEDED FOR COUGH 04/29/16  Yes [name], MD  brompheniramine-pseudoephedrine-DM 30-2-10 MG/5ML syrup TAKE 1 TEASPOONFUL 3 TIMES A DAY AS NEEDED Patient taking differently: TAKE 1 TEASPOONFUL 3 TIMES A DAY AS NEEDED FOR COUGH 05/22/16  Yes [name], MD  budesonide-formoterol [facility]) 160-4.5 MCG/ACT inhaler Inhale 2 puffs into the lungs 2 (two) times daily. 04/14/16  Yes [name], MD  Calcium Carbonate-Vitamin D (CALCIUM + D PO) Take 500 mg by mouth 2 (two) times daily.    Yes Historical Provider, MD  cetirizine (ZYRTEC) 10 MG tablet Take 10 mg by mouth daily.   Yes Historical Provider, MD  Cholecalciferol (VITAMIN D PO) Take 10,000-20,000 Units by mouth 2 (two) times daily. 20000 in the morning and 10000 in the evening   Yes Historical Provider, MD  Cinnamon 500 MG capsule Take 500 mg by mouth daily.   Yes Historical Provider, MD  cyclobenzaprine (FLEXERIL) 10 MG tablet Take 10 mg by mouth 3 (three) times daily as needed for muscle spasms.  07/28/13  Yes Historical Provider, MD  desmopressin  (DDAVP) 0.2 MG tablet Take 200 mcg by mouth at bedtime. 04/10/14  Yes Historical Provider, MD  donepezil (ARICEPT) 23 MG TABS tablet Take 23 mg by mouth at bedtime.   Yes Historical Provider, MD  DYMISTA 137-50 MCG/ACT SUSP PLACE 2 SPRAYS INTO BOTH NOSTRILS 2 (TWO) TIMES DAILY. 04/10/16  Yes Historical Provider, MD  esomeprazole (NEXIUM) 20 MG capsule TAKE 1 CAPSULE (20 MG TOTAL) BY MOUTH DAILY AT 12 NOON. 04/29/16  Yes [name], MD  gabapentin (NEURONTIN) 600 MG tablet 1,200 mg 3 (three) times daily.  07/28/13  Yes Historical Provider, MD  guaiFENesin (MUCINEX) 600 MG 12 hr tablet Take 600 mg by mouth 2 (two) times daily.    Yes Historical Provider, MD  HYDROcodone-homatropine (HYCODAN) 5-1.5 MG/5ML syrup Take  5 mLs by mouth every 6 (six) hours as needed for cough. 05/27/16  Yes Vicie Mutters, PA-C  L-METHYLFOLATE CALCIUM PO Take 1 tablet by mouth daily.  11/24/13  Yes Historical Provider, MD  memantine (NAMENDA) 10 MG tablet Take 20 mg by mouth 2 (two) times daily.    Yes Historical Provider, MD  metFORMIN (GLUCOPHAGE XR) 500 MG 24 hr tablet Take one tablet twice daily with breakfast and dinner. 05/10/15 05/29/16 Yes Courtney Forcucci, PA-C  Methylfol-Algae-B12-Acetylcyst (CEREFOLIN NAC) 6-90.314-2-600 MG TABS Take 1 tablet by mouth daily.  08/03/12  Yes Historical Provider, MD  montelukast (SINGULAIR) 10 MG tablet Take 1 tablet (10 mg total) by mouth daily. 08/13/15  Yes [name], MD  Multiple Vitamins-Minerals (MULTIVITAMIN WITH MINERALS) tablet Take 1 tablet by mouth daily.   Yes Historical Provider, MD  multivitamin-lutein (OCUVITE-LUTEIN) CAPS capsule Take 1 capsule by mouth daily.   Yes Historical Provider, MD  oxyCODONE-acetaminophen (PERCOCET) 10-325 MG tablet Take 1 tablet by mouth every 4 (four) hours as needed for pain.   Yes Historical Provider, MD  pentosan polysulfate (ELMIRON) 100 MG capsule Take 200 mg by mouth 3 (three) times daily before meals.    Yes Historical Provider, MD    Phosphatidylserine-DHA-EPA (VAYACOG) 100-19.5-6.5 MG CAPS Take 1 capsule by mouth 2 (two) times daily.  07/06/14  Yes Historical Provider, MD  pravastatin (PRAVACHOL) 40 MG tablet TAKE 1 TABLET BY MOUTH AT BEDTIME 07/17/15  Yes [name], MD  predniSONE (DELTASONE) 20 MG tablet Take 10-40 mg by mouth daily with breakfast. 4 days of 40mg  4 days of 20mg  2 days of 10mg    Yes Historical Provider, MD  sertraline (ZOLOFT) 50 MG tablet Take 50 mg by mouth 2 (two) times daily.    Yes Historical Provider, MD  tadalafil (CIALIS) 5 MG tablet Take 5 mg by mouth every evening.    Yes Historical Provider, MD  triamcinolone cream (KENALOG) 0.1 % APPLY TOPICALLY 4 TIMES A DAY Patient taking differently: APPLY TOPICALLY 4 TIMES A DAY AS NEEDED FOR RASH 04/29/16  Yes [name], MD  trospium (SANCTURA) 20 MG tablet 20 mg 2 (two) times daily.  06/22/15  Yes Historical Provider, MD  zinc gluconate 50 MG tablet Take 50 mg by mouth daily.   Yes Historical Provider, MD    Physical Exam: Vitals:   05/29/16 1004 05/29/16 1100 05/29/16 1218 05/29/16 1254  BP: 131/85 117/66 125/81 132/75  Pulse: 84 77 78 79  Resp: 20 14 16 20   Temp: 98.5 F (36.9 C)     TempSrc: Oral     SpO2: 95% 93% 95% 99%  Weight:      Height:         General: Appears in moderate respiratory distress, unable to speak in full sentences without SOB and drop of SpO2 to low 80's upper 70's Eyes: PERRLA, EOMI, normal lids, iris ENT:  grossly normal hearing, lips & tongue, mucous membranes moist and intact Neck: no lymphoadenopathy, masses or thyromegaly Cardiovascular: RRR, no m/r/g. No JVD, carotid bruits. No LE edema.  Respiratory: bilateral expiratory wheezes, rales, rhonchi. Normal respiratory effort. No accessory muscle use observed Abdomen: soft, non-tender, non-distended, no organomegaly or masses appreciated. BS present in all quadrants Skin: no rash, ulcers or induration seen on limited exam Musculoskeletal: grossly normal  tone BUE/BLE, good ROM, no bony abnormality or joint deformities observed Psychiatric: grossly normal mood and affect, speech fluent and appropriate, alert and oriented x3 Neurologic: CN II-XII grossly intact, moves all extremities in  coordinated fashion, sensation intact  Labs on Admission: I have personally reviewed following labs and imaging studies  CBC, BMP  GFR: Estimated Creatinine Clearance: 145.9 mL/min (by C-G formula based on SCr of 0.94 mg/dL).   Creatinine Clearance: Estimated Creatinine Clearance: 145.9 mL/min (by C-G formula based on SCr of 0.94 mg/dL).    Radiological Exams on Admission: Dg Chest 2 View  Result Date: 05/29/2016 CLINICAL DATA:  Cough for 1 week. EXAM: CHEST  2 VIEW COMPARISON:  05/25/2016 FINDINGS: The cardiac silhouette remains borderline enlarged. There is no evidence of acute airspace consolidation, edema, pleural effusion, or pneumothorax. No acute osseous abnormality is identified. IMPRESSION: No active cardiopulmonary disease. Electronically Signed   By: Logan Bores M.D.   On: 05/29/2016 06:55    EKG: Independently reviewed - not performed  Assessment/Plan Principal Problem:   Acute exacerbation of COPD with asthma ([place]) Active Problems:   Hypertension   Hyperlipidemia   Partial complex seizure disorder with intractable epilepsy ([facility])   Depression, controlled   COPD with asthma ([place])   COPD (chronic obstructive pulmonary disease) ([facility])   DM (diabetes mellitus) ([place])    Exacerbation of COPD with asthma Continue scheduled nebs q 4 hours and q 2 hours prn Start Levaquin IV, Magnesium IV, guaifenesin Patient received decadron IM yesterday at his PCP and 10 mg IV today in the ED. Will not give any extra steroid therapy Continue Singular Supplemental O2.[place], check ABG  HTN Continue Atenolol and adjust the dose if needed  DM Hold metformin, continue carb modified diet, add SSI  Depression Continue home meds and observe for significant  mood changes  Hyperlipidemia Continue statin therapy  DVT prophylaxis: lovenox Code Status: full Family Communication: none Disposition Plan: MedSurg Consults called: none Admission status: observation   York Grice, PA-C Pager: [phone] [facility]  If 7PM-7AM, please contact night-coverage [email or web address] Password [email or web address]  05/29/2016, 1:02 PM

## 2016-05-29 NOTE — ED Notes (Signed)
Pt 88% on RA, placed on 2L [place].

## 2016-05-29 NOTE — ED Notes (Signed)
RT aware of need for hour long treatment

## 2016-05-29 NOTE — ED Provider Notes (Signed)
[facility] DEPT Provider Note   CSN: [id number] Arrival date & time: 05/29/16  0522     History   Chief Complaint Chief Complaint  Patient presents with  . Asthma    HPI Mark Harrell is a 56 y.o. male.  HPI  Patient presents to the emergency department with cough and congestion with wheezing over the last week.  Patient states that he has a history of asthma and COPD states that he has been seen multiple times with his doctor along with an urgent care for the symptoms.  He states that they have given him several rounds of steroids and also antibiotics.  The patient states that nothing seems make the condition better, states when he lies down at night he feels worse.  He does use CPAP at night.  Patient states that he did take some over-the-counter medications with no relief, but states that the hydrocodone cough syrup did help with his cough. The patient denies chest pain, shortness of breath, headache,blurred vision, neck pain, fever,  weakness, numbness, dizziness, anorexia, edema, abdominal pain, nausea, vomiting, diarrhea, rash, back pain, dysuria, hematemesis, bloody stool, near syncope, or syncope. Past Medical History:  Diagnosis Date  . Anesthesia complication requiring reversal agent administration    ? from central apnea, very difficult to get off vent  . Asthma   . BPH (benign prostatic hyperplasia)   . Dementia   . Hyperlipidemia   . Hypertension   . Hypogonadism male   . IBS (irritable bowel syndrome)   . Obesity   . OSA (obstructive sleep apnea)   . Prostatitis     Patient Active Problem List   Diagnosis Date Noted  . Acute medial meniscal tear, left, subsequent encounter 04/23/2016  . Recurrent infections 03/29/2016  . Chronic nonseasonal allergic rhinitis due to pollen 03/29/2016  . Glucose intolerance (impaired glucose tolerance) 01/16/2016  . Polypharmacy 01/16/2016  . BMI 47.46,   adult ([place]) 03/06/2015  . SDAT 02/05/2015  . Asthma-COPD overlap  syndrome ([place]) 02/05/2015  . OSA (obstructive sleep apnea) 02/05/2015  . Medication management 08/02/2014  . GERD (gastroesophageal reflux disease) 05/09/2014  . Obesity, Class III, BMI 40-49.9 (morbid obesity) ([place]) 02/01/2014  . Vitamin D deficiency 08/01/2013  . Prediabetes 08/01/2013  . 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 Diverticula of colon 05/07/2011  . Hypertension 01/31/2011  . Hyperlipidemia 01/31/2011  . Migraine 01/31/2011  . BPH (benign prostatic hyperplasia) 01/31/2011  . Testosterone Deficiency 01/31/2011  . IBS (irritable bowel syndrome) 01/31/2011  . Partial complex seizure disorder with intractable epilepsy ([place]) 01/31/2011  . Depression, controlled 01/31/2011  . COPD with asthma ([place]) 01/31/2011    Past Surgical History:  Procedure Laterality Date  . ABDOMINAL SURGERY    . ANKLE FRACTURE SURGERY Right   . CYSTOSCOPY     [place]  . TONSILLECTOMY    . [place] RESECTION  2007  . UVULOPALATOPHARYNGOPLASTY         Home Medications    Prior to Admission medications   Medication Sig Start Date End Date Taking? Authorizing Provider  albuterol (PROAIR HFA) 108 (90 Base) MCG/ACT inhaler INHALE 2 PUFFS INTO THE LUNGS EVERY 4 (FOUR) HOURS AS NEEDED FOR WHEEZING OR SHORTNESS OF BREATH. 08/13/15  Yes [name], MD  albuterol (PROVENTIL) (2.5 MG/3ML) 0.083% nebulizer solution Take 3 mLs (2.5 mg total) by nebulization every 6 (six) hours as needed for wheezing or shortness of breath. 05/27/16  Yes Vicie Mutters, PA-C  alfuzosin (UROXATRAL) 10 MG 24 hr tablet Take  10 mg by mouth daily.     Yes Historical Provider, MD  amoxicillin (AMOXIL) 250 MG capsule Take 1 capsule 3 x /day for infection 05/22/16  Yes [name], MD  anastrozole (ARIMIDEX) 1 MG tablet Take 1 mg by mouth daily. 08/08/15  Yes Historical Provider, MD  atenolol (TENORMIN) 100 MG tablet TAKE 1 TABLET BY MOUTH EVERY DAY 09/26/15  Yes [name], MD  baclofen (LIORESAL) 10 MG tablet Take 10 mg by  mouth daily as needed for muscle spasms.  03/03/16  Yes Historical Provider, MD  benzonatate (TESSALON) 200 MG capsule TAKE ONE CAPSULE 3 TIMES A DAY AS NEEDED FOR COUGH 04/29/16  Yes [name], MD  brompheniramine-pseudoephedrine-DM 30-2-10 MG/5ML syrup TAKE 1 TEASPOONFUL 3 TIMES A DAY AS NEEDED Patient taking differently: TAKE 1 TEASPOONFUL 3 TIMES A DAY AS NEEDED FOR COUGH 05/22/16  Yes [name], MD  budesonide-formoterol [facility]) 160-4.5 MCG/ACT inhaler Inhale 2 puffs into the lungs 2 (two) times daily. 04/14/16  Yes [name], MD  Calcium Carbonate-Vitamin D (CALCIUM + D PO) Take 500 mg by mouth 2 (two) times daily.    Yes Historical Provider, MD  cetirizine (ZYRTEC) 10 MG tablet Take 10 mg by mouth daily.   Yes Historical Provider, MD  Cholecalciferol (VITAMIN D PO) Take 10,000-20,000 Units by mouth 2 (two) times daily. 20000 in the morning and 10000 in the evening   Yes Historical Provider, MD  Cinnamon 500 MG capsule Take 500 mg by mouth daily.   Yes Historical Provider, MD  cyclobenzaprine (FLEXERIL) 10 MG tablet Take 10 mg by mouth 3 (three) times daily as needed for muscle spasms.  07/28/13  Yes Historical Provider, MD  desmopressin (DDAVP) 0.2 MG tablet Take 200 mcg by mouth at bedtime. 04/10/14  Yes Historical Provider, MD  donepezil (ARICEPT) 23 MG TABS tablet Take 23 mg by mouth at bedtime.   Yes Historical Provider, MD  DYMISTA 137-50 MCG/ACT SUSP PLACE 2 SPRAYS INTO BOTH NOSTRILS 2 (TWO) TIMES DAILY. 04/10/16  Yes Historical Provider, MD  esomeprazole (NEXIUM) 20 MG capsule TAKE 1 CAPSULE (20 MG TOTAL) BY MOUTH DAILY AT 12 NOON. 04/29/16  Yes [name], MD  gabapentin (NEURONTIN) 600 MG tablet 1,200 mg 3 (three) times daily.  07/28/13  Yes Historical Provider, MD  guaiFENesin (MUCINEX) 600 MG 12 hr tablet Take 600 mg by mouth 2 (two) times daily.    Yes Historical Provider, MD  HYDROcodone-homatropine (HYCODAN) 5-1.5 MG/5ML syrup Take 5 mLs by mouth every 6  (six) hours as needed for cough. 05/27/16  Yes Vicie Mutters, PA-C  L-METHYLFOLATE CALCIUM PO Take 1 tablet by mouth daily.  11/24/13  Yes Historical Provider, MD  memantine (NAMENDA) 10 MG tablet Take 20 mg by mouth 2 (two) times daily.    Yes Historical Provider, MD  metFORMIN (GLUCOPHAGE XR) 500 MG 24 hr tablet Take one tablet twice daily with breakfast and dinner. 05/10/15 05/29/16 Yes Courtney Forcucci, PA-C  Methylfol-Algae-B12-Acetylcyst (CEREFOLIN NAC) 6-90.314-2-600 MG TABS Take 1 tablet by mouth daily.  08/03/12  Yes Historical Provider, MD  montelukast (SINGULAIR) 10 MG tablet Take 1 tablet (10 mg total) by mouth daily. 08/13/15  Yes [name], MD  Multiple Vitamins-Minerals (MULTIVITAMIN WITH MINERALS) tablet Take 1 tablet by mouth daily.   Yes Historical Provider, MD  multivitamin-lutein (OCUVITE-LUTEIN) CAPS capsule Take 1 capsule by mouth daily.   Yes Historical Provider, MD  oxyCODONE-acetaminophen (PERCOCET) 10-325 MG tablet Take 1 tablet by mouth every 4 (four) hours as needed  for pain.   Yes Historical Provider, MD  pentosan polysulfate (ELMIRON) 100 MG capsule Take 200 mg by mouth 3 (three) times daily before meals.    Yes Historical Provider, MD  Phosphatidylserine-DHA-EPA (VAYACOG) 100-19.5-6.5 MG CAPS Take 1 capsule by mouth 2 (two) times daily.  07/06/14  Yes Historical Provider, MD  pravastatin (PRAVACHOL) 40 MG tablet TAKE 1 TABLET BY MOUTH AT BEDTIME 07/17/15  Yes [name], MD  predniSONE (DELTASONE) 20 MG tablet Take 10-40 mg by mouth daily with breakfast. 4 days of 40mg  4 days of 20mg  2 days of 10mg    Yes Historical Provider, MD  sertraline (ZOLOFT) 50 MG tablet Take 50 mg by mouth 2 (two) times daily.    Yes Historical Provider, MD  tadalafil (CIALIS) 5 MG tablet Take 5 mg by mouth every evening.    Yes Historical Provider, MD  triamcinolone cream (KENALOG) 0.1 % APPLY TOPICALLY 4 TIMES A DAY Patient taking differently: APPLY TOPICALLY 4 TIMES A DAY AS NEEDED FOR  RASH 04/29/16  Yes [name], MD  trospium (SANCTURA) 20 MG tablet 20 mg 2 (two) times daily.  06/22/15  Yes Historical Provider, MD  zinc gluconate 50 MG tablet Take 50 mg by mouth daily.   Yes Historical Provider, MD    Family History Family History  Problem Relation Age of Onset  . Diabetes Paternal Uncle   . Cancer Father     lymphoma, colon  . Diabetes Maternal Grandmother   . Heart disease Maternal Grandfather   . Diabetes Maternal Grandfather   . Diabetes Paternal Grandmother   . Diabetes Paternal Grandfather   . Dementia Mother   . Prostate cancer Maternal Uncle   . Lung disease Neg Hx   . Rheumatologic disease Neg Hx     Social History Social History  Substance Use Topics  . Smoking status: Former Smoker    Packs/day: 0.10    Years: 15.00    Start date: 12/14/1980    Quit date: 05/27/1995  . Smokeless tobacco: Never Used     Comment: significant second-hand exposure through mother  . Alcohol use Yes     Comment: rarely     Allergies   Bee venom; Cymbalta [duloxetine hcl]; Fenofibrate; Ppd [tuberculin purified protein derivative]; and Verapamil   Review of Systems Review of Systems All other systems negative except as documented in the HPI. All pertinent positives and negatives as reviewed in the HPI.  Physical Exam Updated Vital Signs BP 150/85   Pulse 79   Temp 98.7 F (37.1 C) (Oral)   Resp 16   Ht 6' (1.829 m)   Wt (!) 174.2 kg   SpO2 97%   BMI 52.08 kg/m   Physical Exam  Constitutional: He is oriented to person, place, and time. He appears well-developed and well-nourished. No distress.  HENT:  Head: Normocephalic and atraumatic.  Mouth/Throat: Oropharynx is clear and moist.  Eyes: Pupils are equal, round, and reactive to light.  Neck: Normal range of motion. Neck supple.  Cardiovascular: Normal rate, regular rhythm and normal heart sounds.  Exam reveals no gallop and no friction rub.   No murmur heard. Pulmonary/Chest: Effort normal.  No respiratory distress. He has wheezes. He has no rales.  Patient also has some upper respiratory sounds on auscultation as well  Neurological: He is alert and oriented to person, place, and time. He exhibits normal muscle tone. Coordination normal.  Skin: Skin is warm and dry. No rash noted. No erythema.  Psychiatric: He has  a normal mood and affect. His behavior is normal.  Nursing note and vitals reviewed.    ED Treatments / Results  Labs (all labs ordered are listed, but only abnormal results are displayed) Labs Reviewed  BASIC METABOLIC PANEL - Abnormal; Notable for the following:       Result Value   Glucose, Bld 115 (*)    Calcium 8.4 (*)    All other components within normal limits  CBC WITH DIFFERENTIAL/PLATELET - Abnormal; Notable for the following:    WBC 14.8 (*)    Neutro Abs 10.2 (*)    All other components within normal limits    EKG  EKG Interpretation None       Radiology Dg Chest 2 View  Result Date: 05/29/2016 CLINICAL DATA:  Cough for 1 week. EXAM: CHEST  2 VIEW COMPARISON:  05/25/2016 FINDINGS: The cardiac silhouette remains borderline enlarged. There is no evidence of acute airspace consolidation, edema, pleural effusion, or pneumothorax. No acute osseous abnormality is identified. IMPRESSION: No active cardiopulmonary disease. Electronically Signed   By: Logan Bores M.D.   On: 05/29/2016 06:55    Procedures Procedures (including critical care time)  Medications Ordered in ED Medications  albuterol (PROVENTIL,VENTOLIN) solution continuous neb (0 mg/hr Nebulization Stopped 05/29/16 0956)  albuterol (PROVENTIL) (2.5 MG/3ML) 0.083% nebulizer solution 5 mg (5 mg Nebulization Given 05/29/16 0534)  ipratropium (ATROVENT) nebulizer solution 1 mg (1 mg Nebulization Given 05/29/16 0819)     Initial Impression / Assessment and Plan / ED Course  I have reviewed the triage vital signs and the nursing notes.  Pertinent labs & imaging results that were available  during my care of the patient were reviewed by me and considered in my medical decision making (see chart for details).  Clinical Course     Patient be given an hour-long neb treatment with Atrovent.  X-ray did not show any abnormality. Patient will be admitted to the hospital for COPD exacerbation.  Patient has and given multiple outpatient treatments without success.  I spoke with the [facility] hospitalist, who will admit the patient  Final Clinical Impressions(s) / ED Diagnoses   Final diagnoses:  None    New Prescriptions New Prescriptions   No medications on file     Dalia Heading, PA-C 05/29/16 [place], DO 05/29/16 1450

## 2016-05-29 NOTE — ED Notes (Addendum)
Neb treatment completed at this time. EDP aware. Pt is to rest for 5-10 minutes then ambulate with pulse ox. Pt ambulated to restroom without difficulty however O2 saturation was 88% on arrival back to the room.

## 2016-05-30 ENCOUNTER — Telehealth: Payer: Self-pay

## 2016-05-30 DIAGNOSIS — G4733 Obstructive sleep apnea (adult) (pediatric): Secondary | ICD-10-CM | POA: Diagnosis present

## 2016-05-30 DIAGNOSIS — Z79899 Other long term (current) drug therapy: Secondary | ICD-10-CM | POA: Diagnosis not present

## 2016-05-30 DIAGNOSIS — N400 Enlarged prostate without lower urinary tract symptoms: Secondary | ICD-10-CM | POA: Diagnosis present

## 2016-05-30 DIAGNOSIS — J45901 Unspecified asthma with (acute) exacerbation: Principal | ICD-10-CM

## 2016-05-30 DIAGNOSIS — F329 Major depressive disorder, single episode, unspecified: Secondary | ICD-10-CM | POA: Diagnosis present

## 2016-05-30 DIAGNOSIS — Z7984 Long term (current) use of oral hypoglycemic drugs: Secondary | ICD-10-CM | POA: Diagnosis not present

## 2016-05-30 DIAGNOSIS — J441 Chronic obstructive pulmonary disease with (acute) exacerbation: Secondary | ICD-10-CM

## 2016-05-30 DIAGNOSIS — J449 Chronic obstructive pulmonary disease, unspecified: Secondary | ICD-10-CM | POA: Diagnosis present

## 2016-05-30 DIAGNOSIS — G40219 Localization-related (focal) (partial) symptomatic epilepsy and epileptic syndromes with complex partial seizures, intractable, without status epilepticus: Secondary | ICD-10-CM | POA: Diagnosis present

## 2016-05-30 DIAGNOSIS — J069 Acute upper respiratory infection, unspecified: Secondary | ICD-10-CM | POA: Diagnosis present

## 2016-05-30 DIAGNOSIS — Z7951 Long term (current) use of inhaled steroids: Secondary | ICD-10-CM | POA: Diagnosis not present

## 2016-05-30 DIAGNOSIS — I10 Essential (primary) hypertension: Secondary | ICD-10-CM | POA: Diagnosis present

## 2016-05-30 DIAGNOSIS — E785 Hyperlipidemia, unspecified: Secondary | ICD-10-CM | POA: Diagnosis present

## 2016-05-30 DIAGNOSIS — F0390 Unspecified dementia without behavioral disturbance: Secondary | ICD-10-CM | POA: Diagnosis present

## 2016-05-30 DIAGNOSIS — Z6841 Body Mass Index (BMI) 40.0 and over, adult: Secondary | ICD-10-CM | POA: Diagnosis not present

## 2016-05-30 DIAGNOSIS — R0902 Hypoxemia: Secondary | ICD-10-CM | POA: Diagnosis present

## 2016-05-30 DIAGNOSIS — Z87891 Personal history of nicotine dependence: Secondary | ICD-10-CM | POA: Diagnosis not present

## 2016-05-30 DIAGNOSIS — E119 Type 2 diabetes mellitus without complications: Secondary | ICD-10-CM | POA: Diagnosis present

## 2016-05-30 DIAGNOSIS — E291 Testicular hypofunction: Secondary | ICD-10-CM | POA: Diagnosis present

## 2016-05-30 DIAGNOSIS — K5090 Crohn's disease, unspecified, without complications: Secondary | ICD-10-CM | POA: Diagnosis present

## 2016-05-30 DIAGNOSIS — K589 Irritable bowel syndrome without diarrhea: Secondary | ICD-10-CM | POA: Diagnosis present

## 2016-05-30 LAB — GLUCOSE, CAPILLARY
GLUCOSE-CAPILLARY: 107 mg/dL — AB (ref 65–99)
GLUCOSE-CAPILLARY: 120 mg/dL — AB (ref 65–99)
GLUCOSE-CAPILLARY: 126 mg/dL — AB (ref 65–99)
Glucose-Capillary: 165 mg/dL — ABNORMAL HIGH (ref 65–99)

## 2016-05-30 LAB — COMPREHENSIVE METABOLIC PANEL
ALT: 24 U/L (ref 17–63)
AST: 19 U/L (ref 15–41)
Albumin: 3.3 g/dL — ABNORMAL LOW (ref 3.5–5.0)
Alkaline Phosphatase: 54 U/L (ref 38–126)
Anion gap: 9 (ref 5–15)
BILIRUBIN TOTAL: 0.9 mg/dL (ref 0.3–1.2)
BUN: 14 mg/dL (ref 6–20)
CHLORIDE: 99 mmol/L — AB (ref 101–111)
CO2: 28 mmol/L (ref 22–32)
Calcium: 8.5 mg/dL — ABNORMAL LOW (ref 8.9–10.3)
Creatinine, Ser: 0.82 mg/dL (ref 0.61–1.24)
GFR calc Af Amer: >60 mL/min (ref >60)
Glucose, Bld: 156 mg/dL — ABNORMAL HIGH (ref 65–99)
POTASSIUM: 4 mmol/L (ref 3.5–5.1)
Sodium: 136 mmol/L (ref 135–145)
TOTAL PROTEIN: 5.9 g/dL — AB (ref 6.5–8.1)

## 2016-05-30 LAB — CBC
HEMATOCRIT: 46.6 % (ref 39.0–52.0)
Hemoglobin: 15.4 g/dL (ref 13.0–17.0)
MCH: 30.5 pg (ref 26.0–34.0)
MCHC: 33 g/dL (ref 30.0–36.0)
MCV: 92.3 fL (ref 78.0–100.0)
PLATELETS: 318 10*3/uL (ref 150–400)
RBC: 5.05 MIL/uL (ref 4.22–5.81)
RDW: 13.9 % (ref 11.5–15.5)
WBC: 18.7 10*3/uL — ABNORMAL HIGH (ref 4.0–10.5)

## 2016-05-30 MED ORDER — PREDNISONE 20 MG PO TABS
40.0000 mg | ORAL_TABLET | Freq: Every day | ORAL | Status: DC
  Administered 2016-05-31 – 2016-06-01 (×2): 40 mg via ORAL
  Filled 2016-05-30 (×2): qty 2

## 2016-05-30 NOTE — Evaluation (Signed)
Physical Therapy Evaluation Patient Details Name: Mark Harrell MRN: [id number] DOB: 22-Sep-1960 Today's Date: 05/30/2016   History of Present Illness  56 yo male with onset of SOB and cough after Christmas, now with cleared flu and PMHx:  dementia, asthma, HTN, OSA, DM, seizure,   Clinical Impression  Pt is noted to be independent walking in his room with turns and obstacle clearance, and has no decline in O2 sats at baseline O2.  He is encouraged to follow up with outpatient therapy for longer endurance training with monitoring of vitals given his COPD gold status.  Will discharge for now as he is not demonstrating acute therapy needs.    Follow Up Recommendations Outpatient PT (versus cardiac rehab to condition with chronic lung changes)    Equipment Recommendations  None recommended by PT    Recommendations for Other Services       Precautions / Restrictions Precautions Precautions: Fall (telemetry) Restrictions Weight Bearing Restrictions: No      Mobility  Bed Mobility               General bed mobility comments: up when PT arrived  Transfers Overall transfer level: Modified independent Equipment used: None             General transfer comment: pt uses hand placement on chair arm when sitting  Ambulation/Gait Ambulation/Gait assistance: Supervision Ambulation Distance (Feet): 80 Feet Assistive device: None Gait Pattern/deviations: Step-to pattern;Step-through pattern;Wide base of support;Decreased stride length Gait velocity: reduced Gait velocity interpretation: Below normal speed for age/gender General Gait Details: Pt is accomodating his body habitus with gait pattern  Stairs            Wheelchair Mobility    Modified Rankin (Stroke Patients Only)       Balance Overall balance assessment: No apparent balance deficits (not formally assessed)                                           Pertinent Vitals/Pain Pain  Assessment: Faces Faces Pain Scale: Hurts a little bit Pain Location: L knee OA Pain Intervention(s): Monitored during session;Premedicated before session;Repositioned    Home Living Family/patient expects to be discharged to:: Private residence Living Arrangements: Parent Available Help at Discharge: Family;Available 24 hours/day Type of Home: House Home Access: Stairs to enter Entrance Stairs-Rails: Right;Left;Can reach both Entrance Stairs-Number of Steps: 2 Home Layout: One level Home Equipment: Cane - single point      Prior Function Level of Independence: Independent with assistive device(s)               Hand Dominance   Dominant Hand: Right    Extremity/Trunk Assessment   Upper Extremity Assessment Upper Extremity Assessment: Overall WFL for tasks assessed    Lower Extremity Assessment Lower Extremity Assessment: Overall WFL for tasks assessed    Cervical / Trunk Assessment Cervical / Trunk Assessment: Normal  Communication   Communication: No difficulties  Cognition Arousal/Alertness: Awake/alert Behavior During Therapy: WFL for tasks assessed/performed Overall Cognitive Status: Within Functional Limits for tasks assessed                      General Comments General comments (skin integrity, edema, etc.): Pt has been noted to remain at 97% O2 sats with all mobility on O2 which he used at baseline    Exercises     Assessment/Plan  PT Assessment All further PT needs can be met in the next venue of care  PT Problem List Cardiopulmonary status limiting activity;Obesity;Other (comment) (deconditioned but not affecting LE strength)          PT Treatment Interventions      PT Goals (Current goals can be found in the Care Plan section)  Acute Rehab PT Goals Patient Stated Goal: to get feeling less SOB PT Goal Formulation: All assessment and education complete, DC therapy    Frequency     Barriers to discharge         Co-evaluation               End of Session Equipment Utilized During Treatment: Oxygen Activity Tolerance: Patient tolerated treatment well;Other (comment) (minor SOB with no decline in O2 sats) Patient left: in chair;with call bell/phone within reach Nurse Communication: Mobility status    Functional Assessment Tool Used: clinical judgment Functional Limitation: Mobility: Walking and moving around Mobility: Walking and Moving Around Current Status (Z6109): At least 1 percent but less than 20 percent impaired, limited or restricted Mobility: Walking and Moving Around Goal Status [phone]): At least 1 percent but less than 20 percent impaired, limited or restricted Mobility: Walking and Moving Around Discharge Status [phone]): At least 1 percent but less than 20 percent impaired, limited or restricted    Time: 0950-1009 PT Time Calculation (min) (ACUTE ONLY): 19 min   Charges:   PT Evaluation $PT Eval Moderate Complexity: 1 Procedure     PT G Codes:   PT G-Codes **NOT FOR INPATIENT CLASS** Functional Assessment Tool Used: clinical judgment Functional Limitation: Mobility: Walking and moving around Mobility: Walking and Moving Around Current Status (B1478): At least 1 percent but less than 20 percent impaired, limited or restricted Mobility: Walking and Moving Around Goal Status [phone]): At least 1 percent but less than 20 percent impaired, limited or restricted Mobility: Walking and Moving Around Discharge Status [phone]): At least 1 percent but less than 20 percent impaired, limited or restricted    Ramond Dial 05/30/2016, 11:36 AM   Mee Hives, PT MS Acute Rehab Dept. Number: [place] [facility] [place]

## 2016-05-30 NOTE — Evaluation (Signed)
Occupational Therapy Evaluation Patient Details Name: Mark Harrell MRN: [id number] DOB: Oct 11, 1960 Today's Date: 05/30/2016    History of Present Illness 57 yo male with onset of SOB and cough after Christmas, now with cleared flu and PMHx:  dementia, asthma, HTN, OSA, DM, seizure,    Clinical Impression   Pt completes selfcare tasks at a modified independent level.  Educated pt on AE availability to help with energy conservation and provided handout.  No further acute care OT needs at this time.  Recommend incentive spirometer for use in the room as pt does not currently have one.  Discussed with nursing and they are ordering as there are not any on the floor at this time.      Follow Up Recommendations  No OT follow up    Equipment Recommendations  None recommended by OT    Recommendations for Other Services       Precautions / Restrictions Precautions Precautions: None Restrictions Weight Bearing Restrictions: No      Mobility Bed Mobility               General bed mobility comments: up when PT arrived  Transfers Overall transfer level: Modified independent Equipment used: None             General transfer comment: pt uses hand placement on chair arm when sitting    Balance Overall balance assessment: No apparent balance deficits (not formally assessed)                                          ADL Overall ADL's : Modified independent                                       General ADL Comments: Pt overall modified independent with selfcare tasks and functional mobility.  Recommended reacher for home as well as toilet aide as pt reports and demonstrates difficulty with toilet hygiene and with retrieving items from the floor.  Pt is agreeable and will likely purchase online.  Oxygen sats remained greater than 93% throughout session on room air.  Discussed possible need for shower seat for endurance purposes but do not feel  this is absolutely mandatory.  No further issues at this time.      Vision Vision Assessment?: No apparent visual deficits   Perception Perception Perception Tested?: No   Praxis Praxis Praxis tested?: Not tested    Pertinent Vitals/Pain Pain Assessment: 0-10 Faces Pain Scale: Hurts a little bit Pain Location: left knee OA Pain Intervention(s): Limited activity within patient's tolerance;Monitored during session     Hand Dominance Right   Extremity/Trunk Assessment Upper Extremity Assessment Upper Extremity Assessment: Defer to OT evaluation   Lower Extremity Assessment Lower Extremity Assessment: Generalized weakness   Cervical / Trunk Assessment Cervical / Trunk Assessment: Normal   Communication Communication Communication: No difficulties   Cognition Arousal/Alertness: Awake/alert Behavior During Therapy: WFL for tasks assessed/performed Overall Cognitive Status: Within Functional Limits for tasks assessed                                Home Living Family/patient expects to be discharged to:: Private residence Living Arrangements: Parent Available Help at Discharge: Family;Available 24 hours/day Type of Home: House  Home Access: Stairs to enter Entrance Stairs-Number of Steps: (P) 2 Entrance Stairs-Rails: Right;Left;Can reach both Home Layout: One level     Bathroom Shower/Tub: Teacher, early years/pre: (P) Standard Bathroom Accessibility: (P) No   Home Equipment: Cane - single point          Prior Functioning/Environment Level of Independence: Independent with assistive device(s)                       OT Goals(Current goals can be found in the care plan section) Acute Rehab OT Goals Patient Stated Goal: to get feeling less SOB  OT Frequency:                End of Session Nurse Communication: Mobility status (need for incentive spirometer)  Activity Tolerance: Patient tolerated treatment well Patient left: in  chair   Time: 1336-1411 OT Time Calculation (min): 35 min Charges:  OT General Charges $OT Visit: 1 Procedure OT Evaluation $OT Eval Moderate Complexity: 1 Procedure OT Treatments $Self Care/Home Management : 8-22 mins G-Codes: OT G-codes **NOT FOR INPATIENT CLASS** Functional Assessment Tool Used: clinical judgement Functional Limitation: Self care Self Care Current Status [id number]): At least 1 percent but less than 20 percent impaired, limited or restricted Self Care Goal Status [id number]): At least 1 percent but less than 20 percent impaired, limited or restricted Self Care Discharge Status [phone]): At least 1 percent but less than 20 percent impaired, limited or restricted  [place] OTR/L 05/30/2016, 2:28 PM

## 2016-05-30 NOTE — Progress Notes (Signed)
Nutrition Brief Note  RD consulted to assess nutritional needs/status per COPD GOLD Protocol.   Wt Readings from Last 15 Encounters:  05/30/16 (!) 380 lb 14.4 oz (172.8 kg)  05/27/16 (!) 385 lb (174.6 kg)  05/23/16 (!) 368 lb (166.9 kg)  05/08/16 (!) 365 lb (165.6 kg)  05/07/16 (!) 357 lb (161.9 kg)  04/23/16 (!) 362 lb (164.2 kg)  04/23/16 (!) 356 lb (161.5 kg)  04/14/16 (!) 356 lb 12.8 oz (161.8 kg)  04/10/16 (!) 357 lb (161.9 kg)  03/28/16 (!) 368 lb (166.9 kg)  02/13/16 (!) 357 lb (161.9 kg)  01/15/16 (!) 374 lb 12.5 oz (170 kg)  01/15/16 (!) 363 lb 6.4 oz (164.8 kg)  08/13/15 (!) 352 lb 3.2 oz (159.8 kg)  05/10/15 (!) 340 lb (154.2 kg)   Mark Harrell is a 56 y.o. male with a Past Medical History significant for seizure, hypertension, COPD with asthma who presents with Mark Harrell. Diagnosed with COPD exacerbation. Respiratory panel positive for parainfluenza. We'll continue droplet precautions. Based component of bronchitis likely respiratory picture. Respiratory therapy has been consulted. Patient was appears comfortable on exam has inspiratory & exhilation wheezes.  Body mass index is 51.66 kg/m. Patient meets criteria for extreme obesity, class III based on current BMI.   Current diet order is carb modified, patient is consuming approximately 100% of meals at this time. Labs and medications reviewed.   No nutrition interventions warranted at this time. If nutrition issues arise, please consult RD.   Mark Harrell A. Jimmye Norman, RD, LDN, CDE Pager: [phone] After hours Pager: [phone]

## 2016-05-30 NOTE — Telephone Encounter (Signed)
Clld pt - admitted to [facility] on 05/29/16 due to Acute exacerbation of  COPD w/asthma ([place]). Advsd pt to f/u w/provider before scheduling injections to make sure this is the correct path to continue. Pt stated he would.

## 2016-05-30 NOTE — Progress Notes (Signed)
PROGRESS NOTE    Mark DERRING  [id number] DOB: [birth year] DOA: 05/29/2016 PCP: [name], MD   Brief Narrative:   Patient admitted for sob and found to have URI/Asthma exacerbation.   Assessment & Plan:   Principal Problem:   Acute exacerbation of COPD with asthma ([place]) Active Problems:   Hypertension   Hyperlipidemia   Partial complex seizure disorder with intractable epilepsy ([place])   Depression, controlled   COPD with asthma ([place])   COPD (chronic obstructive pulmonary disease) ([place])   DM (diabetes mellitus) ([place])  Acute Exacerbation of Asthma likely in the setting of Viral URI with hypoxia on room air  Continue scheduled nebs q 4 hours and q 2 hours prn Start Levaquin IV, Magnesium IV, guaifenesin Patient received decadron IM yesterday at his PCP and 10 mg IV today in the Ed. Will cont prednisone 40mg   Continue Singular Supplemental O2.[place] Advised the staff to perform ambulatory pulse ok   HTN Continue Atenolol and adjust the dose if needed  DM Hold metformin, continue carb modified diet, add SSI  Depression Continue home meds and observe for significant mood changes  Hyperlipidemia Continue statin therapy  OSA -CPAP  DVT prophylaxis: lovenox Code Status: full Family Communication: none Disposition Plan: Wll require atleast one more day of hospital stay  Consults called: none Admission status: observation  It is my clinical opinion that admission to [facility] is reasonable and necessary in this 56 y.o. male . presenting with symptoms of SOB after failing outpatient therapy concerning for Asthma exac in the setting of URI . in the context of PMH including: Asthma, morbid obesity  . with pertinent positives on physical exam including: Diffuse exp wheezing  . and pertinent positives on radiographic and laboratory data including: leukocytosis  Workup and treatment include steroid, Abx- Levaquin , nebulizer, supplemental o2.   Given the  aforementioned, the predictability of an adverse outcome is felt to be significant. I expect that the patient will require at least 2 midnights in the hospital to treat this condition.  . (To order inpatient, physicians should use the expectation that the patient will require at least 2 midnights in the hospital and the medical record supports that reasonable expectation.  . You must document the medical necessity to support the patient's inpatient admission with a brief synopsis of concerns, risks and expectations for the patient's hospital stay.  . Factors to consider when making the decision to admit include: . The severity of signs/symptoms . The medical predictability of something adverse happening to the patient   Clinical Example: Abdominal pain with concern for possible SBO in this 75yom with high risk PMH including recent abdominal surgery, DM, Crohn's disease and vascular insufficiency. I expect my patient to require a hospital stay of at least 2 midnights to treat this condition.)   Consultants:   None   Procedures:   None   Antimicrobials:   Levaquin D2   Subjective: Patient states he feels slightly better today but still gets sob with moving around in the bed and from bed to his chair. He had to be placed on o2 due to hypoxia in mid 80s on Room air.   Objective: Vitals:   05/30/16 1256 05/30/16 1330 05/30/16 1417 05/30/16 1632  BP:  134/78    Pulse:  78 85   Resp:  18    Temp:  98.9 F (37.2 C)    TempSrc:  Oral    SpO2: 96% 93% 93% 94%  Weight:  Height:        Intake/Output Summary (Last 24 hours) at 05/30/16 1752 Last data filed at 05/30/16 [id number]  Gross per 24 hour  Intake              360 ml  Output                0 ml  Net              360 ml   Filed Weights   05/29/16 0529 05/29/16 1539 05/30/16 0547  Weight: (!) 174.2 kg (384 lb) (!) 173.7 kg (383 lb) (!) 172.8 kg (380 lb 14.4 oz)    Examination:  General exam: Appears calm and  comfortable  Respiratory system: diffuse exp wheezing  Cardiovascular system: S1 & S2 heard, RRR. No JVD, murmurs, rubs, gallops or clicks. No pedal edema. Gastrointestinal system: Abdomen is nondistended, soft and nontender. No organomegaly or masses felt. Normal bowel sounds heard. Central nervous system: Alert and oriented. No focal neurological deficits. Extremities: Symmetric 5 x 5 power. Skin: No rashes, lesions or ulcers Psychiatry: Judgement and insight appear normal. Mood & affect appropriate.     Data Reviewed:   CBC:  Recent Labs Lab 05/29/16 0638 05/30/16 0609  WBC 14.8* 18.7*  NEUTROABS 10.2*  --   HGB 14.6 15.4  HCT 44.9 46.6  MCV 92.0 92.3  PLT 286 0000000   Basic Metabolic Panel:  Recent Labs Lab 05/29/16 0638 05/30/16 0609  NA 138 136  K 3.8 4.0  CL 101 99*  CO2 27 28  GLUCOSE 115* 156*  BUN 17 14  CREATININE 0.94 0.82  CALCIUM 8.4* 8.5*   GFR: Estimated Creatinine Clearance: 166.6 mL/min (by C-G formula based on SCr of 0.82 mg/dL). Liver Function Tests:  Recent Labs Lab 05/30/16 0609  AST 19  ALT 24  ALKPHOS 54  BILITOT 0.9  PROT 5.9*  ALBUMIN 3.3*   No results for input(s): LIPASE, AMYLASE in the last 168 hours. No results for input(s): AMMONIA in the last 168 hours. Coagulation Profile: No results for input(s): INR, PROTIME in the last 168 hours. Cardiac Enzymes: No results for input(s): CKTOTAL, CKMB, CKMBINDEX, TROPONINI in the last 168 hours. BNP (last 3 results) No results for input(s): PROBNP in the last 8760 hours. HbA1C: No results for input(s): HGBA1C in the last 72 hours. CBG:  Recent Labs Lab 05/29/16 1751 05/29/16 2124 05/30/16 0753 05/30/16 1140 05/30/16 1646  GLUCAP 108* 189* 165* 107* 120*   Lipid Profile: No results for input(s): CHOL, HDL, LDLCALC, TRIG, CHOLHDL, LDLDIRECT in the last 72 hours. Thyroid Function Tests: No results for input(s): TSH, T4TOTAL, FREET4, T3FREE, THYROIDAB in the last 72  hours. Anemia Panel: No results for input(s): VITAMINB12, FOLATE, FERRITIN, TIBC, IRON, RETICCTPCT in the last 72 hours. Sepsis Labs: No results for input(s): PROCALCITON, LATICACIDVEN in the last 168 hours.  Recent Results (from the past 240 hour(s))  Respiratory Panel by PCR     Status: Abnormal   Collection Time: 05/29/16  1:13 PM  Result Value Ref Range Status   Adenovirus NOT DETECTED NOT DETECTED Final   Coronavirus 229E NOT DETECTED NOT DETECTED Final   Coronavirus HKU1 NOT DETECTED NOT DETECTED Final   Coronavirus NL63 NOT DETECTED NOT DETECTED Final   Coronavirus OC43 NOT DETECTED NOT DETECTED Final   Metapneumovirus NOT DETECTED NOT DETECTED Final   Rhinovirus / Enterovirus NOT DETECTED NOT DETECTED Final   Influenza A NOT DETECTED NOT DETECTED Final   Influenza  B NOT DETECTED NOT DETECTED Final   Parainfluenza Virus 1 NOT DETECTED NOT DETECTED Final   Parainfluenza Virus 2 NOT DETECTED NOT DETECTED Final   Parainfluenza Virus 3 NOT DETECTED NOT DETECTED Final   Parainfluenza Virus 4 DETECTED (A) NOT DETECTED Final   Respiratory Syncytial Virus NOT DETECTED NOT DETECTED Final   Bordetella pertussis NOT DETECTED NOT DETECTED Final   Chlamydophila pneumoniae NOT DETECTED NOT DETECTED Final   Mycoplasma pneumoniae NOT DETECTED NOT DETECTED Final         Radiology Studies: Dg Chest 2 View  Result Date: 05/29/2016 CLINICAL DATA:  Cough for 1 week. EXAM: CHEST  2 VIEW COMPARISON:  05/25/2016 FINDINGS: The cardiac silhouette remains borderline enlarged. There is no evidence of acute airspace consolidation, edema, pleural effusion, or pneumothorax. No acute osseous abnormality is identified. IMPRESSION: No active cardiopulmonary disease. Electronically Signed   By: Logan Bores M.D.   On: 05/29/2016 06:55        Scheduled Meds: . alfuzosin  10 mg Oral Daily  . anastrozole  1 mg Oral Daily  . atenolol  100 mg Oral Daily  . darifenacin  7.5 mg Oral Daily  . desmopressin   200 mcg Oral QHS  . donepezil  23 mg Oral QHS  . enoxaparin (LOVENOX) injection  0.5 mg/kg Subcutaneous Q24H  . gabapentin  1,200 mg Oral TID  . guaiFENesin  15 mL Oral Q4H  . insulin aspart  0-5 Units Subcutaneous QHS  . insulin aspart  0-9 Units Subcutaneous TID WC  . ipratropium-albuterol  3 mL Nebulization QID  . levofloxacin (LEVAQUIN) IV  500 mg Intravenous Q24H  . memantine  20 mg Oral BID  . montelukast  10 mg Oral Daily  . pantoprazole  40 mg Oral Daily  . pentosan polysulfate  200 mg Oral TID AC  . pravastatin  40 mg Oral QHS  . sertraline  50 mg Oral BID   Continuous Infusions:   LOS: 0 days    Time spent: 35 mins     Ankit Arsenio Loader, MD [facility] Pager [phone]   If 7PM-7AM, please contact night-coverage [email or web address] Password [email or web address] 05/30/2016, 5:52 PM

## 2016-05-30 NOTE — Care Management Note (Addendum)
Case Management Note  Patient Details  Name: Mark Harrell MRN: [id number] Date of Birth: 02-Jul-1960  Subjective/Objective:   COPD exacerbation                Action/Plan: Discharge Planning: NCM spoke to pt and states plan is dc home tomorrow. Pt states he lives with his mother. Has [employer] at home. Oxygen sats per PT notes were within range. Attempting to wean pt off oxygen. Will have Unit RN recheck walking sats in am prior to dc to see if oxygen needed. Will contact attending if oxygen needed for home.   PCP  [name] MD  Expected Discharge Date:                  Expected Discharge Plan:  Home/Self Care  In-House Referral:  NA  Discharge planning Services  CM Consult  Post Acute Care Choice:  NA Choice offered to:  NA  DME Arranged:  N/A DME Agency:  NA  HH Arranged:  NA HH Agency:  NA  Status of Service:  Completed, signed off  If discussed at [place] of Stay Meetings, dates discussed:    Additional Comments:  Erenest Rasher, RN 05/30/2016, 4:54 PM

## 2016-05-31 LAB — GLUCOSE, CAPILLARY
Glucose-Capillary: 89 mg/dL (ref 65–99)
Glucose-Capillary: 142 mg/dL — ABNORMAL HIGH (ref 65–99)
Glucose-Capillary: 109 mg/dL — ABNORMAL HIGH (ref 65–99)
Glucose-Capillary: 109 mg/dL — ABNORMAL HIGH (ref 65–99)

## 2016-05-31 MED ORDER — ORAL CARE MOUTH RINSE
15.0000 mL | Freq: Two times a day (BID) | OROMUCOSAL | Status: DC
  Administered 2016-05-31 (×2): 15 mL via OROMUCOSAL

## 2016-05-31 NOTE — Progress Notes (Signed)
PROGRESS NOTE    Mark Harrell  [id number] DOB: Dec 26, 1960 DOA: 05/29/2016 PCP: [name], MD   Brief Narrative:   Patient admitted for sob and found to have URI/Asthma exacerbation.   Assessment & Plan:   Principal Problem:   Acute exacerbation of COPD with asthma ([place]) Active Problems:   Hypertension   Hyperlipidemia   Partial complex seizure disorder with intractable epilepsy ([place])   Depression, controlled   COPD with asthma ([place])   COPD (chronic obstructive pulmonary disease) ([place])   DM (diabetes mellitus) ([place])   Acute asthma exacerbation  Acute Exacerbation of Asthma likely in the setting of Viral URI with hypoxia on room air  Continue scheduled nebs q 4 hours and q 2 hours prn On Levaquin IV, guaifenesin Patient received decadron IM yesterday at his PCP and 10 mg IV today in the Ed. Will cont prednisone 40mg   Continue Singular Supplemental O2.[place] as needed especially while ambulating    HTN Continue Atenolol and adjust the dose if needed  DM Hold metformin, continue carb modified diet, add SSI  Depression Continue home meds and observe for significant mood changes  Hyperlipidemia Continue statin therapy  OSA -CPAP  DVT prophylaxis: lovenox Code Status: full Family Communication: none Disposition Plan: Will keep him for another day as his symptoms have been slower to improve than expected. He may also be high risk for readmission.  Consults called: none Admission status: Inpatient    Consultants:   None   Procedures:   None   Antimicrobials:   Levaquin D2   Subjective:  He states his sats dropped to low 90s with ambulation on room air. His cough is worse but breathing is little better but does get worst with movement. Remains afebrile. He is tolerating po diet.   Objective: Vitals:   05/31/16 0430 05/31/16 0752 05/31/16 1055 05/31/16 1142  BP: (!) 154/84  (!) 125/91   Pulse: 70  90   Resp: 20     Temp: 98.8 F (37.1  C)     TempSrc:      SpO2: 96% 96%  99%  Weight:      Height:        Intake/Output Summary (Last 24 hours) at 05/31/16 1157 Last data filed at 05/31/16 1100  Gross per 24 hour  Intake             1220 ml  Output                0 ml  Net             1220 ml   Filed Weights   05/29/16 1539 05/30/16 0547 05/31/16 0143  Weight: (!) 173.7 kg (383 lb) (!) 172.8 kg (380 lb 14.4 oz) (!) 172.8 kg (380 lb 14.5 oz)    Examination:  General exam: Appears calm and comfortable  Respiratory system: mild exp coarse BS Cardiovascular system: S1 & S2 heard, RRR. No JVD, murmurs, rubs, gallops or clicks. No pedal edema. Gastrointestinal system: Abdomen is nondistended, soft and nontender. No organomegaly or masses felt. Normal bowel sounds heard. Central nervous system: Alert and oriented. No focal neurological deficits. Extremities: Symmetric 5 x 5 power. Skin: No rashes, lesions or ulcers Psychiatry: Judgement and insight appear normal. Mood & affect appropriate.     Data Reviewed:   CBC:  Recent Labs Lab 05/29/16 0638 05/30/16 0609  WBC 14.8* 18.7*  NEUTROABS 10.2*  --   HGB 14.6 15.4  HCT 44.9 46.6  MCV  92.0 92.3  PLT 286 0000000   Basic Metabolic Panel:  Recent Labs Lab 05/29/16 0638 05/30/16 0609  NA 138 136  K 3.8 4.0  CL 101 99*  CO2 27 28  GLUCOSE 115* 156*  BUN 17 14  CREATININE 0.94 0.82  CALCIUM 8.4* 8.5*   GFR: Estimated Creatinine Clearance: 166.6 mL/min (by C-G formula based on SCr of 0.82 mg/dL). Liver Function Tests:  Recent Labs Lab 05/30/16 0609  AST 19  ALT 24  ALKPHOS 54  BILITOT 0.9  PROT 5.9*  ALBUMIN 3.3*   No results for input(s): LIPASE, AMYLASE in the last 168 hours. No results for input(s): AMMONIA in the last 168 hours. Coagulation Profile: No results for input(s): INR, PROTIME in the last 168 hours. Cardiac Enzymes: No results for input(s): CKTOTAL, CKMB, CKMBINDEX, TROPONINI in the last 168 hours. BNP (last 3 results) No  results for input(s): PROBNP in the last 8760 hours. HbA1C: No results for input(s): HGBA1C in the last 72 hours. CBG:  Recent Labs Lab 05/30/16 0753 05/30/16 1140 05/30/16 1646 05/30/16 2143 05/31/16 0741  GLUCAP 165* 107* 120* 126* 89   Lipid Profile: No results for input(s): CHOL, HDL, LDLCALC, TRIG, CHOLHDL, LDLDIRECT in the last 72 hours. Thyroid Function Tests: No results for input(s): TSH, T4TOTAL, FREET4, T3FREE, THYROIDAB in the last 72 hours. Anemia Panel: No results for input(s): VITAMINB12, FOLATE, FERRITIN, TIBC, IRON, RETICCTPCT in the last 72 hours. Sepsis Labs: No results for input(s): PROCALCITON, LATICACIDVEN in the last 168 hours.  Recent Results (from the past 240 hour(s))  Respiratory Panel by PCR     Status: Abnormal   Collection Time: 05/29/16  1:13 PM  Result Value Ref Range Status   Adenovirus NOT DETECTED NOT DETECTED Final   Coronavirus 229E NOT DETECTED NOT DETECTED Final   Coronavirus HKU1 NOT DETECTED NOT DETECTED Final   Coronavirus NL63 NOT DETECTED NOT DETECTED Final   Coronavirus OC43 NOT DETECTED NOT DETECTED Final   Metapneumovirus NOT DETECTED NOT DETECTED Final   Rhinovirus / Enterovirus NOT DETECTED NOT DETECTED Final   Influenza A NOT DETECTED NOT DETECTED Final   Influenza B NOT DETECTED NOT DETECTED Final   Parainfluenza Virus 1 NOT DETECTED NOT DETECTED Final   Parainfluenza Virus 2 NOT DETECTED NOT DETECTED Final   Parainfluenza Virus 3 NOT DETECTED NOT DETECTED Final   Parainfluenza Virus 4 DETECTED (A) NOT DETECTED Final   Respiratory Syncytial Virus NOT DETECTED NOT DETECTED Final   Bordetella pertussis NOT DETECTED NOT DETECTED Final   Chlamydophila pneumoniae NOT DETECTED NOT DETECTED Final   Mycoplasma pneumoniae NOT DETECTED NOT DETECTED Final         Radiology Studies: No results found.      Scheduled Meds: . alfuzosin  10 mg Oral Daily  . anastrozole  1 mg Oral Daily  . atenolol  100 mg Oral Daily  .  darifenacin  7.5 mg Oral Daily  . desmopressin  200 mcg Oral QHS  . donepezil  23 mg Oral QHS  . enoxaparin (LOVENOX) injection  0.5 mg/kg Subcutaneous Q24H  . gabapentin  1,200 mg Oral TID  . guaiFENesin  15 mL Oral Q4H  . insulin aspart  0-5 Units Subcutaneous QHS  . insulin aspart  0-9 Units Subcutaneous TID WC  . ipratropium-albuterol  3 mL Nebulization QID  . levofloxacin (LEVAQUIN) IV  500 mg Intravenous Q24H  . mouth rinse  15 mL Mouth Rinse BID  . memantine  20 mg Oral BID  .  montelukast  10 mg Oral Daily  . pantoprazole  40 mg Oral Daily  . pentosan polysulfate  200 mg Oral TID AC  . pravastatin  40 mg Oral QHS  . predniSONE  40 mg Oral Q breakfast  . sertraline  50 mg Oral BID   Continuous Infusions:   LOS: 1 day    Time spent: 35 mins     Keitra Carusone Arsenio Loader, MD [facility] Pager [phone]   If 7PM-7AM, please contact night-coverage [email or web address] Password [email or web address] 05/31/2016, 11:57 AM

## 2016-05-31 NOTE — Progress Notes (Signed)
Pt. Places cpap on when ready.RT informed pt. To notify if he needed any assistance.

## 2016-06-01 LAB — GLUCOSE, CAPILLARY
GLUCOSE-CAPILLARY: 85 mg/dL (ref 65–99)
Glucose-Capillary: 151 mg/dL — ABNORMAL HIGH (ref 65–99)

## 2016-06-01 MED ORDER — LEVOFLOXACIN 500 MG PO TABS
500.0000 mg | ORAL_TABLET | Freq: Every day | ORAL | Status: DC
  Administered 2016-06-01: 500 mg via ORAL
  Filled 2016-06-01: qty 1

## 2016-06-01 MED ORDER — PREDNISONE 20 MG PO TABS: ORAL_TABLET | ORAL | 0 refills | Status: DC

## 2016-06-01 MED ORDER — LEVOFLOXACIN 500 MG PO TABS: 500.0000 mg | ORAL_TABLET | Freq: Every day | ORAL | 0 refills | Status: AC

## 2016-06-01 NOTE — Progress Notes (Signed)
PROGRESS NOTE    Mark Harrell  [id number] DOB: [birth year] DOA: 05/29/2016 PCP: [name], MD   Brief Narrative:   Patient admitted for sob and found to have URI/Asthma exacerbation.   Assessment & Plan:   Principal Problem:   Acute exacerbation of COPD with asthma ([place]) Active Problems:   Hypertension   Hyperlipidemia   Partial complex seizure disorder with intractable epilepsy ([place])   Depression, controlled   COPD with asthma ([place])   COPD (chronic obstructive pulmonary disease) ([place])   DM (diabetes mellitus) ([place])   Acute asthma exacerbation  Acute Exacerbation of Asthma likely in the setting of Viral URI with hypoxia on room air  Continue scheduled nebs q 4 hours and q 2 hours prn On Levaquin IV and will switch it to oral upon discharge, guaifenesin Patient received decadron IMat his PCP and 10 mg IV in the Ed. Will cont prednisone 40mg   For 5 more day, meanwhile he needs to go see his PCP to ensure improvement in his symptoms.  Continue Singular Ambulatory pulse ox again today prior to his discharge.    HTN Continue Atenolol and adjust the dose if needed  DM Hold metformin, continue carb modified diet, add SSI  Depression Continue home meds and observe for significant mood changes  Hyperlipidemia Continue statin therapy  OSA -CPAP  DVT prophylaxis: lovenox Code Status: full Family Communication: none Disposition Plan: Today he is doing better, I think he has reached maximum in hospital stay benefit but he does remain high risk for readmission.  Consults called: none Admission status: Inpatient    Consultants:   None   Procedures:   None   Antimicrobials:   Levaquin D2   Subjective: No complaints today, he states he is feeling better. He is able to ambulate himself without any difficulty.   Objective: Vitals:   06/01/16 0128 06/01/16 0453 06/01/16 0955 06/01/16 1045  BP:  (!) 142/87  135/70  Pulse:  77  93  Resp:  20     Temp:  98.4 F (36.9 C)    TempSrc:      SpO2:  95% 97%   Weight: (!) 172.8 kg (380 lb 14.2 oz)     Height:        Intake/Output Summary (Last 24 hours) at 06/01/16 1338 Last data filed at 06/01/16 1031  Gross per 24 hour  Intake             1140 ml  Output                0 ml  Net             1140 ml   Filed Weights   05/30/16 0547 05/31/16 0143 06/01/16 0128  Weight: (!) 172.8 kg (380 lb 14.4 oz) (!) 172.8 kg (380 lb 14.5 oz) (!) 172.8 kg (380 lb 14.2 oz)    Examination:  General exam: Appears calm and comfortable  Respiratory system: very mild exp coarse BS Cardiovascular system: S1 & S2 heard, RRR. No JVD, murmurs, rubs, gallops or clicks. No pedal edema. Gastrointestinal system: Abdomen is nondistended, soft and nontender. No organomegaly or masses felt. Normal bowel sounds heard. Central nervous system: Alert and oriented. No focal neurological deficits. Extremities: Symmetric 5 x 5 power. Skin: No rashes, lesions or ulcers Psychiatry: Judgement and insight appear normal. Mood & affect appropriate.     Data Reviewed:   CBC:  Recent Labs Lab 05/29/16 0638 05/30/16 0609  WBC 14.8* 18.7*  NEUTROABS 10.2*  --   HGB 14.6 15.4  HCT 44.9 46.6  MCV 92.0 92.3  PLT 286 0000000   Basic Metabolic Panel:  Recent Labs Lab 05/29/16 0638 05/30/16 0609  NA 138 136  K 3.8 4.0  CL 101 99*  CO2 27 28  GLUCOSE 115* 156*  BUN 17 14  CREATININE 0.94 0.82  CALCIUM 8.4* 8.5*   GFR: Estimated Creatinine Clearance: 166.6 mL/min (by C-G formula based on SCr of 0.82 mg/dL). Liver Function Tests:  Recent Labs Lab 05/30/16 0609  AST 19  ALT 24  ALKPHOS 54  BILITOT 0.9  PROT 5.9*  ALBUMIN 3.3*   No results for input(s): LIPASE, AMYLASE in the last 168 hours. No results for input(s): AMMONIA in the last 168 hours. Coagulation Profile: No results for input(s): INR, PROTIME in the last 168 hours. Cardiac Enzymes: No results for input(s): CKTOTAL, CKMB, CKMBINDEX,  TROPONINI in the last 168 hours. BNP (last 3 results) No results for input(s): PROBNP in the last 8760 hours. HbA1C: No results for input(s): HGBA1C in the last 72 hours. CBG:  Recent Labs Lab 05/31/16 1332 05/31/16 1721 05/31/16 2033 06/01/16 0822 06/01/16 1242  GLUCAP 142* 109* 109* 85 151*   Lipid Profile: No results for input(s): CHOL, HDL, LDLCALC, TRIG, CHOLHDL, LDLDIRECT in the last 72 hours. Thyroid Function Tests: No results for input(s): TSH, T4TOTAL, FREET4, T3FREE, THYROIDAB in the last 72 hours. Anemia Panel: No results for input(s): VITAMINB12, FOLATE, FERRITIN, TIBC, IRON, RETICCTPCT in the last 72 hours. Sepsis Labs: No results for input(s): PROCALCITON, LATICACIDVEN in the last 168 hours.  Recent Results (from the past 240 hour(s))  Respiratory Panel by PCR     Status: Abnormal   Collection Time: 05/29/16  1:13 PM  Result Value Ref Range Status   Adenovirus NOT DETECTED NOT DETECTED Final   Coronavirus 229E NOT DETECTED NOT DETECTED Final   Coronavirus HKU1 NOT DETECTED NOT DETECTED Final   Coronavirus NL63 NOT DETECTED NOT DETECTED Final   Coronavirus OC43 NOT DETECTED NOT DETECTED Final   Metapneumovirus NOT DETECTED NOT DETECTED Final   Rhinovirus / Enterovirus NOT DETECTED NOT DETECTED Final   Influenza A NOT DETECTED NOT DETECTED Final   Influenza B NOT DETECTED NOT DETECTED Final   Parainfluenza Virus 1 NOT DETECTED NOT DETECTED Final   Parainfluenza Virus 2 NOT DETECTED NOT DETECTED Final   Parainfluenza Virus 3 NOT DETECTED NOT DETECTED Final   Parainfluenza Virus 4 DETECTED (A) NOT DETECTED Final   Respiratory Syncytial Virus NOT DETECTED NOT DETECTED Final   Bordetella pertussis NOT DETECTED NOT DETECTED Final   Chlamydophila pneumoniae NOT DETECTED NOT DETECTED Final   Mycoplasma pneumoniae NOT DETECTED NOT DETECTED Final         Radiology Studies: No results found.      Scheduled Meds: . alfuzosin  10 mg Oral Daily  .  anastrozole  1 mg Oral Daily  . atenolol  100 mg Oral Daily  . darifenacin  7.5 mg Oral Daily  . desmopressin  200 mcg Oral QHS  . donepezil  23 mg Oral QHS  . enoxaparin (LOVENOX) injection  0.5 mg/kg Subcutaneous Q24H  . gabapentin  1,200 mg Oral TID  . guaiFENesin  15 mL Oral Q4H  . insulin aspart  0-5 Units Subcutaneous QHS  . insulin aspart  0-9 Units Subcutaneous TID WC  . levofloxacin (LEVAQUIN) IV  500 mg Intravenous Q24H  . mouth rinse  15 mL Mouth Rinse BID  .  memantine  20 mg Oral BID  . montelukast  10 mg Oral Daily  . pantoprazole  40 mg Oral Daily  . pentosan polysulfate  200 mg Oral TID AC  . pravastatin  40 mg Oral QHS  . predniSONE  40 mg Oral Q breakfast  . sertraline  50 mg Oral BID   Continuous Infusions:   LOS: 2 days    Time spent: 35 mins     Joseline Mccampbell Arsenio Loader, MD [facility] Pager [phone]   If 7PM-7AM, please contact night-coverage [email or web address] Password [email or web address] 06/01/2016, 1:38 PM

## 2016-06-01 NOTE — Progress Notes (Signed)
Return call from Dr. Reesa Chew, stated ok to discharge pt.  Alphonzo Lemmings, RN

## 2016-06-01 NOTE — Discharge Summary (Signed)
Physician Discharge Summary  MICHARL SABAN [id number] DOB: June 17, 1960 DOA: 05/29/2016  PCP: [name], MD  Admit date: 05/29/2016 Discharge date: 06/01/2016  Admitted From: Home Disposition: Home  Recommendations for Outpatient Follow-up:  1. Follow up with PCP in 4 days 2. NEEDS TO SEE HIS PCP Dr. Melford Aase PRIOR TO STOPPING HIS STEROIDS IN NEXT 6 DAYS.   Home Health: NO Equipment/Devices:NONE Discharge Condition: Stable CODE STATUS:Full  Diet recommendation: Return to prehospital diet   Brief/Interim Summary: Mark Harrell is a 56 y.o. male with a Past Medical History significant for seizure, hypertension, COPD with asthma who presents with [name]. Diagnosed with COPD exacerbation. Respiratory panel positive for parainfluenza.While in the hospital he received treatment with IV steroids which was later changes to PO steroids, broad spectrum Antibiotics - Later changes to Levaquin to be continued outpatient for 5 more days as well.  He did well with Ambulatory Pulse Ox as well. I have advised him to continue taking his taper dose of steroids but needs to go see his PCP prior to stopping it so ensure improvement in his condition meanwhile continue breathing treatments as prescribed and supportive care as needed.  At this time he has reached maximum benefit from his inpatient stay and is stable to be discharged home. He does remain high risk for readmission.    Discharge Diagnoses:  Principal Problem:   Acute exacerbation of COPD with asthma ([place]) Active Problems:   Hypertension   Hyperlipidemia   Partial complex seizure disorder with intractable epilepsy ([place])   Depression, controlled   COPD with asthma ([place])   COPD (chronic obstructive pulmonary disease) ([facility])   DM (diabetes mellitus) ([place])   Acute asthma exacerbation   Acute Exacerbation of Asthma likely in the setting of Viral URI with hypoxia on room air  Continue scheduled nebs q 4 hours and q 2 hours prn On  Levaquin IV and will switch it to oral upon discharge for 6 more days , guaifenesin Patient received decadron IMat his PCP and 10 mg IV in the Ed. Will cont prednisone 40mg   For 4 more day and then 20mg  po for 2 more days, meanwhile he needs to go see his PCP to ensure improvement in his symptoms.  Continue Singular Ambulatory pulse ox is in mid 90s on room air at the time of the discharge.    HTN Continue Atenolol and adjust the dose if needed  DM Hold metformin, continue carb modified diet, add SSI  Depression Continue home meds and observe for significant mood changes  Hyperlipidemia Continue statin therapy  OSA -CPAP  Discharge Instructions   Allergies as of 06/01/2016      Reactions   Bee Venom Swelling   Cymbalta [duloxetine Hcl] Other (See Comments)   unknown   Fenofibrate    Back pain   Ppd [tuberculin Purified Protein Derivative]    +ppd NEG Quantferron Gold 3/13   Verapamil Other (See Comments)   Back pain      Medication List    STOP taking these medications   amoxicillin 250 MG capsule Commonly known as:  AMOXIL     TAKE these medications   albuterol 108 (90 Base) MCG/ACT inhaler Commonly known as:  PROAIR HFA INHALE 2 PUFFS INTO THE LUNGS EVERY 4 (FOUR) HOURS AS NEEDED FOR WHEEZING OR SHORTNESS OF BREATH. What changed:  Another medication with the same name was removed. Continue taking this medication, and follow the directions you see here.   alfuzosin 10 MG 24 hr  tablet Commonly known as:  UROXATRAL Take 10 mg by mouth daily.   anastrozole 1 MG tablet Commonly known as:  ARIMIDEX Take 1 mg by mouth daily.   atenolol 100 MG tablet Commonly known as:  TENORMIN TAKE 1 TABLET BY MOUTH EVERY DAY   baclofen 10 MG tablet Commonly known as:  LIORESAL Take 10 mg by mouth daily as needed for muscle spasms.   benzonatate 200 MG capsule Commonly known as:  TESSALON TAKE ONE CAPSULE 3 TIMES A DAY AS NEEDED FOR COUGH    brompheniramine-pseudoephedrine-DM 30-2-10 MG/5ML syrup TAKE 1 TEASPOONFUL 3 TIMES A DAY AS NEEDED What changed:  See the new instructions.   budesonide-formoterol 160-4.5 MCG/ACT inhaler Commonly known as:  SYMBICORT Inhale 2 puffs into the lungs 2 (two) times daily.   CALCIUM + D PO Take 500 mg by mouth 2 (two) times daily.   CEREFOLIN NAC 6-90.314-2-600 MG Tabs Take 1 tablet by mouth daily.   cetirizine 10 MG tablet Commonly known as:  ZYRTEC Take 10 mg by mouth daily.   Cinnamon 500 MG capsule Take 500 mg by mouth daily.   cyclobenzaprine 10 MG tablet Commonly known as:  FLEXERIL Take 10 mg by mouth 3 (three) times daily as needed for muscle spasms.   desmopressin 0.2 MG tablet Commonly known as:  DDAVP Take 200 mcg by mouth at bedtime.   donepezil 23 MG Tabs tablet Commonly known as:  ARICEPT Take 23 mg by mouth at bedtime.   DYMISTA 137-50 MCG/ACT Susp Generic drug:  Azelastine-Fluticasone PLACE 2 SPRAYS INTO BOTH NOSTRILS 2 (TWO) TIMES DAILY.   esomeprazole 20 MG capsule Commonly known as:  NEXIUM TAKE 1 CAPSULE (20 MG TOTAL) BY MOUTH DAILY AT 12 NOON.   gabapentin 600 MG tablet Commonly known as:  NEURONTIN 1,200 mg 3 (three) times daily.   guaiFENesin 600 MG 12 hr tablet Commonly known as:  MUCINEX Take 600 mg by mouth 2 (two) times daily.   HYDROcodone-homatropine 5-1.5 MG/5ML syrup Commonly known as:  HYCODAN Take 5 mLs by mouth every 6 (six) hours as needed for cough.   L-METHYLFOLATE CALCIUM PO Take 1 tablet by mouth daily.   levofloxacin 500 MG tablet Commonly known as:  LEVAQUIN Take 1 tablet (500 mg total) by mouth daily.   metFORMIN 500 MG 24 hr tablet Commonly known as:  GLUCOPHAGE XR Take one tablet twice daily with breakfast and dinner.   montelukast 10 MG tablet Commonly known as:  SINGULAIR Take 1 tablet (10 mg total) by mouth daily.   multivitamin with minerals tablet Take 1 tablet by mouth daily. What changed:  Another  medication with the same name was removed. Continue taking this medication, and follow the directions you see here.   NAMENDA 10 MG tablet Generic drug:  memantine Take 20 mg by mouth 2 (two) times daily.   oxyCODONE-acetaminophen 10-325 MG tablet Commonly known as:  PERCOCET Take 1 tablet by mouth every 4 (four) hours as needed for pain.   pentosan polysulfate 100 MG capsule Commonly known as:  ELMIRON Take 200 mg by mouth 3 (three) times daily before meals.   pravastatin 40 MG tablet Commonly known as:  PRAVACHOL TAKE 1 TABLET BY MOUTH AT BEDTIME   predniSONE 20 MG tablet Commonly known as:  DELTASONE 40 mg oral daily for 4 days then  20 mg oral daily for 2 more days What changed:  how much to take  how to take this  when to take this  additional  instructions   sertraline 50 MG tablet Commonly known as:  ZOLOFT Take 50 mg by mouth 2 (two) times daily.   tadalafil 5 MG tablet Commonly known as:  CIALIS Take 5 mg by mouth every evening.   triamcinolone cream 0.1 % Commonly known as:  KENALOG APPLY TOPICALLY 4 TIMES A DAY What changed:  See the new instructions.   trospium 20 MG tablet Commonly known as:  SANCTURA 20 mg 2 (two) times daily.   VAYACOG 100-19.5-6.5 MG Caps Generic drug:  Phosphatidylserine-DHA-EPA Take 1 capsule by mouth 2 (two) times daily.   VITAMIN D PO Take 10,000-20,000 Units by mouth 2 (two) times daily. 20000 in the morning and 10000 in the evening   zinc gluconate 50 MG tablet Take 50 mg by mouth daily.      Follow-up Information    MCKEOWN,WILLIAM DAVID, MD. Schedule an appointment as soon as possible for a visit in 4 day(s).   Specialty:  Internal Medicine Why:  Recent Hospitalization for Pneumonia and URI secondary to Parainfluenza. He remains on taper dose of steroid. Needs to see PCP prior to stopping his steroids in 6 days.  Contact information: [address] [place] [address] [phone]           Allergies  Allergen Reactions  . Bee Venom Swelling  . Cymbalta [Duloxetine Hcl] Other (See Comments)    unknown  . Fenofibrate     Back pain  . Ppd [Tuberculin Purified Protein Derivative]     +ppd NEG Quantferron Gold 3/13  . Verapamil Other (See Comments)    Back pain    Consultations:  None   Procedures/Studies: Dg Chest 2 View  Result Date: 05/29/2016 CLINICAL DATA:  Cough for 1 week. EXAM: CHEST  2 VIEW COMPARISON:  05/25/2016 FINDINGS: The cardiac silhouette remains borderline enlarged. There is no evidence of acute airspace consolidation, edema, pleural effusion, or pneumothorax. No acute osseous abnormality is identified. IMPRESSION: No active cardiopulmonary disease. Electronically Signed   By: Logan Bores M.D.   On: 05/29/2016 06:55   Dg Chest 2 View  Result Date: 05/25/2016 CLINICAL DATA:  Cough, shortness of breath. EXAM: CHEST  2 VIEW COMPARISON:  Radiographs of February 13, 2016. FINDINGS: Stable cardiomediastinal silhouette. No pneumothorax or pleural effusion is noted. Both lungs are clear. The visualized skeletal structures are unremarkable. IMPRESSION: No active cardiopulmonary disease. Electronically Signed   By: Marijo Conception, M.D.   On: 05/25/2016 13:17   (Echo, Carotid, EGD, Colonoscopy, ERCP)    Subjective:   Discharge Exam: Vitals:   06/01/16 1045 06/01/16 1430  BP: 135/70 120/72  Pulse: 93 74  Resp:  17  Temp:  99.2 F (37.3 C)   Vitals:   06/01/16 0453 06/01/16 0955 06/01/16 1045 06/01/16 1430  BP: (!) 142/87  135/70 120/72  Pulse: 77  93 74  Resp: 20   17  Temp: 98.4 F (36.9 C)   99.2 F (37.3 C)  TempSrc:    Oral  SpO2: 95% 97%  95%  Weight:      Height:        General: Pt is alert, awake, not in acute distress Cardiovascular: RRR, S1/S2 +, no rubs, no gallops Respiratory: CTA bilaterally, no wheezing, no rhonchi Abdominal: Soft, NT, ND, bowel sounds + Extremities: no edema, no cyanosis    The results of significant  diagnostics from this hospitalization (including imaging, microbiology, ancillary and laboratory) are listed below for reference.     Microbiology: Recent  Results (from the past 240 hour(s))  Respiratory Panel by PCR     Status: Abnormal   Collection Time: 05/29/16  1:13 PM  Result Value Ref Range Status   Adenovirus NOT DETECTED NOT DETECTED Final   Coronavirus 229E NOT DETECTED NOT DETECTED Final   Coronavirus HKU1 NOT DETECTED NOT DETECTED Final   Coronavirus NL63 NOT DETECTED NOT DETECTED Final   Coronavirus OC43 NOT DETECTED NOT DETECTED Final   Metapneumovirus NOT DETECTED NOT DETECTED Final   Rhinovirus / Enterovirus NOT DETECTED NOT DETECTED Final   Influenza A NOT DETECTED NOT DETECTED Final   Influenza B NOT DETECTED NOT DETECTED Final   Parainfluenza Virus 1 NOT DETECTED NOT DETECTED Final   Parainfluenza Virus 2 NOT DETECTED NOT DETECTED Final   Parainfluenza Virus 3 NOT DETECTED NOT DETECTED Final   Parainfluenza Virus 4 DETECTED (A) NOT DETECTED Final   Respiratory Syncytial Virus NOT DETECTED NOT DETECTED Final   Bordetella pertussis NOT DETECTED NOT DETECTED Final   Chlamydophila pneumoniae NOT DETECTED NOT DETECTED Final   Mycoplasma pneumoniae NOT DETECTED NOT DETECTED Final     Labs: BNP (last 3 results)  Recent Labs  02/13/16 1204  BNP 99991111   Basic Metabolic Panel:  Recent Labs Lab 05/29/16 0638 05/30/16 0609  NA 138 136  K 3.8 4.0  CL 101 99*  CO2 27 28  GLUCOSE 115* 156*  BUN 17 14  CREATININE 0.94 0.82  CALCIUM 8.4* 8.5*   Liver Function Tests:  Recent Labs Lab 05/30/16 0609  AST 19  ALT 24  ALKPHOS 54  BILITOT 0.9  PROT 5.9*  ALBUMIN 3.3*   No results for input(s): LIPASE, AMYLASE in the last 168 hours. No results for input(s): AMMONIA in the last 168 hours. CBC:  Recent Labs Lab 05/29/16 0638 05/30/16 0609  WBC 14.8* 18.7*  NEUTROABS 10.2*  --   HGB 14.6 15.4  HCT 44.9 46.6  MCV 92.0 92.3  PLT 286 318   Cardiac  Enzymes: No results for input(s): CKTOTAL, CKMB, CKMBINDEX, TROPONINI in the last 168 hours. BNP: Invalid input(s): POCBNP CBG:  Recent Labs Lab 05/31/16 1332 05/31/16 1721 05/31/16 2033 06/01/16 0822 06/01/16 1242  GLUCAP 142* 109* 109* 85 151*   D-Dimer No results for input(s): DDIMER in the last 72 hours. Hgb A1c No results for input(s): HGBA1C in the last 72 hours. Lipid Profile No results for input(s): CHOL, HDL, LDLCALC, TRIG, CHOLHDL, LDLDIRECT in the last 72 hours. Thyroid function studies No results for input(s): TSH, T4TOTAL, T3FREE, THYROIDAB in the last 72 hours.  Invalid input(s): FREET3 Anemia work up No results for input(s): VITAMINB12, FOLATE, FERRITIN, TIBC, IRON, RETICCTPCT in the last 72 hours. Urinalysis    Component Value Date/Time   COLORURINE YELLOW 04/23/2016 1702   APPEARANCEUR CLEAR 04/23/2016 1702   LABSPEC 1.023 04/23/2016 1702   PHURINE 5.0 04/23/2016 1702   GLUCOSEU NEGATIVE 04/23/2016 1702   HGBUR NEGATIVE 04/23/2016 1702   BILIRUBINUR NEGATIVE 04/23/2016 1702   KETONESUR NEGATIVE 04/23/2016 1702   PROTEINUR NEGATIVE 04/23/2016 1702   NITRITE NEGATIVE 04/23/2016 1702   LEUKOCYTESUR NEGATIVE 04/23/2016 1702   Sepsis Labs Invalid input(s): PROCALCITONIN,  WBC,  LACTICIDVEN Microbiology Recent Results (from the past 240 hour(s))  Respiratory Panel by PCR     Status: Abnormal   Collection Time: 05/29/16  1:13 PM  Result Value Ref Range Status   Adenovirus NOT DETECTED NOT DETECTED Final   Coronavirus 229E NOT DETECTED NOT DETECTED Final   Coronavirus HKU1  NOT DETECTED NOT DETECTED Final   Coronavirus NL63 NOT DETECTED NOT DETECTED Final   Coronavirus OC43 NOT DETECTED NOT DETECTED Final   Metapneumovirus NOT DETECTED NOT DETECTED Final   Rhinovirus / Enterovirus NOT DETECTED NOT DETECTED Final   Influenza A NOT DETECTED NOT DETECTED Final   Influenza B NOT DETECTED NOT DETECTED Final   Parainfluenza Virus 1 NOT DETECTED NOT DETECTED  Final   Parainfluenza Virus 2 NOT DETECTED NOT DETECTED Final   Parainfluenza Virus 3 NOT DETECTED NOT DETECTED Final   Parainfluenza Virus 4 DETECTED (A) NOT DETECTED Final   Respiratory Syncytial Virus NOT DETECTED NOT DETECTED Final   Bordetella pertussis NOT DETECTED NOT DETECTED Final   Chlamydophila pneumoniae NOT DETECTED NOT DETECTED Final   Mycoplasma pneumoniae NOT DETECTED NOT DETECTED Final     Time coordinating discharge: Over 30 minutes  SIGNED:   Damita Lack, MD  [facility] 06/01/2016, 4:21 PM Pager   If 7PM-7AM, please contact night-coverage [email or web address] Password [email or web address]

## 2016-06-01 NOTE — Progress Notes (Signed)
Pt. ambulated in hall without O2, O2 sats was 90 % at rest, sats remained at 93% most of the time during ambulation, highest was 96%.  Pt.  c/o of some SOB during ambulation.  Text paged MD with above information.  Will continue to monitor and await for return call.   Alphonzo Lemmings, RN

## 2016-06-01 NOTE — Discharge Instructions (Signed)
Chronic Obstructive Pulmonary Disease Chronic obstructive pulmonary disease (COPD) is a common lung problem. In COPD, the flow of air from the lungs is limited. The way your lungs work will probably never return to normal, but there are things you can do to improve your lungs and make yourself feel better. Your doctor may treat your condition with:  Medicines.  Oxygen.  Lung surgery.  Changes to your diet.  Rehabilitation. This may involve a team of specialists. Follow these instructions at home:  Take all medicines as told by your doctor.  Avoid medicines or cough syrups that dry up your airway (such as antihistamines) and do not allow you to get rid of thick spit. You do not need to avoid them if told differently by your doctor.  If you smoke, stop. Smoking makes the problem worse.  Avoid being around things that make your breathing worse (like smoke, chemicals, and fumes).  Use oxygen therapy and therapy to help improve your lungs (pulmonary rehabilitation) if told by your doctor. If you need home oxygen therapy, ask your doctor if you should buy a tool to measure your oxygen level (oximeter).  Avoid people who have a sickness you can catch (contagious).  Avoid going outside when it is very hot, cold, or humid.  Eat healthy foods. Eat smaller meals more often. Rest before meals.  Stay active, but remember to also rest.  Make sure to get all the shots (vaccines) your doctor recommends. Ask your doctor if you need a pneumonia shot.  Learn and use tips on how to relax.  Learn and use tips on how to control your breathing as told by your doctor. Try: 1. Breathing in (inhaling) through your nose for 1 second. Then, pucker your lips and breath out (exhale) through your lips for 2 seconds. 2. Putting one hand on your belly (abdomen). Breathe in slowly through your nose for 1 second. Your hand on your belly should move out. Pucker your lips and breathe out slowly through your lips.  Your hand on your belly should move in as you breathe out.  Learn and use controlled coughing to clear thick spit from your lungs. The steps are: 1. Lean your head a little forward. 2. Breathe in deeply. 3. Try to hold your breath for 3 seconds. 4. Keep your mouth slightly open while coughing 2 times. 5. Spit any thick spit out into a tissue. 6. Rest and do the steps again 1 or 2 times as needed. Contact a doctor if:  You cough up more thick spit than usual.  There is a change in the color or thickness of the spit.  It is harder to breathe than usual.  Your breathing is faster than usual. Get help right away if:  You have shortness of breath while resting.  You have shortness of breath that stops you from:  Being able to talk.  Doing normal activities.  You chest hurts for longer than 5 minutes.  Your skin color is more blue than usual.  Your pulse oximeter shows that you have low oxygen for longer than 5 minutes. This information is not intended to replace advice given to you by your health care provider. Make sure you discuss any questions you have with your health care provider. Document Released: 10/29/2007 Document Revised: 10/18/2015 Document Reviewed: 01/06/2013 [employer]vier Interactive Patient Education  2017 [employer]  

## 2016-06-02 LAB — GLUCOSE, CAPILLARY: GLUCOSE-CAPILLARY: 115 mg/dL — AB (ref 65–99)

## 2016-06-03 ENCOUNTER — Inpatient Hospital Stay (HOSPITAL_COMMUNITY)
Admission: EM | Admit: 2016-06-03 | Discharge: 2016-06-09 | DRG: 872 | Disposition: A | Discharge status: Home / Self Care | Payer: BLUE CROSS/BLUE SHIELD | Attending: Internal Medicine | Admitting: Internal Medicine

## 2016-06-03 ENCOUNTER — Telehealth: Payer: Self-pay | Admitting: *Deleted

## 2016-06-03 ENCOUNTER — Encounter (HOSPITAL_COMMUNITY): Payer: Self-pay

## 2016-06-03 ENCOUNTER — Emergency Department (HOSPITAL_COMMUNITY): Payer: BLUE CROSS/BLUE SHIELD

## 2016-06-03 DIAGNOSIS — N400 Enlarged prostate without lower urinary tract symptoms: Secondary | ICD-10-CM | POA: Diagnosis not present

## 2016-06-03 DIAGNOSIS — F329 Major depressive disorder, single episode, unspecified: Secondary | ICD-10-CM | POA: Diagnosis not present

## 2016-06-03 DIAGNOSIS — R197 Diarrhea, unspecified: Secondary | ICD-10-CM | POA: Diagnosis not present

## 2016-06-03 DIAGNOSIS — Z833 Family history of diabetes mellitus: Secondary | ICD-10-CM

## 2016-06-03 DIAGNOSIS — L2489 Irritant contact dermatitis due to other agents: Secondary | ICD-10-CM | POA: Diagnosis present

## 2016-06-03 DIAGNOSIS — E119 Type 2 diabetes mellitus without complications: Secondary | ICD-10-CM | POA: Diagnosis not present

## 2016-06-03 DIAGNOSIS — K219 Gastro-esophageal reflux disease without esophagitis: Secondary | ICD-10-CM | POA: Diagnosis present

## 2016-06-03 DIAGNOSIS — Z7984 Long term (current) use of oral hypoglycemic drugs: Secondary | ICD-10-CM

## 2016-06-03 DIAGNOSIS — G4733 Obstructive sleep apnea (adult) (pediatric): Secondary | ICD-10-CM | POA: Diagnosis present

## 2016-06-03 DIAGNOSIS — Z79899 Other long term (current) drug therapy: Secondary | ICD-10-CM

## 2016-06-03 DIAGNOSIS — Z8042 Family history of malignant neoplasm of prostate: Secondary | ICD-10-CM | POA: Diagnosis not present

## 2016-06-03 DIAGNOSIS — I10 Essential (primary) hypertension: Secondary | ICD-10-CM | POA: Diagnosis present

## 2016-06-03 DIAGNOSIS — J189 Pneumonia, unspecified organism: Secondary | ICD-10-CM | POA: Diagnosis present

## 2016-06-03 DIAGNOSIS — R112 Nausea with vomiting, unspecified: Secondary | ICD-10-CM | POA: Diagnosis not present

## 2016-06-03 DIAGNOSIS — A0811 Acute gastroenteropathy due to Norwalk agent: Secondary | ICD-10-CM | POA: Diagnosis present

## 2016-06-03 DIAGNOSIS — Z6841 Body Mass Index (BMI) 40.0 and over, adult: Secondary | ICD-10-CM | POA: Diagnosis not present

## 2016-06-03 DIAGNOSIS — F3341 Major depressive disorder, recurrent, in partial remission: Secondary | ICD-10-CM | POA: Diagnosis present

## 2016-06-03 DIAGNOSIS — Z87891 Personal history of nicotine dependence: Secondary | ICD-10-CM | POA: Diagnosis not present

## 2016-06-03 DIAGNOSIS — Z807 Family history of other malignant neoplasms of lymphoid, hematopoietic and related tissues: Secondary | ICD-10-CM | POA: Diagnosis not present

## 2016-06-03 DIAGNOSIS — E782 Mixed hyperlipidemia: Secondary | ICD-10-CM | POA: Diagnosis present

## 2016-06-03 DIAGNOSIS — J449 Chronic obstructive pulmonary disease, unspecified: Secondary | ICD-10-CM | POA: Diagnosis present

## 2016-06-03 DIAGNOSIS — E785 Hyperlipidemia, unspecified: Secondary | ICD-10-CM | POA: Diagnosis not present

## 2016-06-03 DIAGNOSIS — J4489 Other specified chronic obstructive pulmonary disease: Secondary | ICD-10-CM | POA: Diagnosis present

## 2016-06-03 DIAGNOSIS — N401 Enlarged prostate with lower urinary tract symptoms: Secondary | ICD-10-CM

## 2016-06-03 DIAGNOSIS — Z8249 Family history of ischemic heart disease and other diseases of the circulatory system: Secondary | ICD-10-CM | POA: Diagnosis not present

## 2016-06-03 DIAGNOSIS — R109 Unspecified abdominal pain: Secondary | ICD-10-CM

## 2016-06-03 DIAGNOSIS — L249 Irritant contact dermatitis, unspecified cause: Secondary | ICD-10-CM | POA: Diagnosis present

## 2016-06-03 DIAGNOSIS — A419 Sepsis, unspecified organism: Secondary | ICD-10-CM | POA: Diagnosis not present

## 2016-06-03 DIAGNOSIS — Z7951 Long term (current) use of inhaled steroids: Secondary | ICD-10-CM | POA: Diagnosis not present

## 2016-06-03 DIAGNOSIS — R509 Fever, unspecified: Secondary | ICD-10-CM | POA: Diagnosis not present

## 2016-06-03 DIAGNOSIS — R0602 Shortness of breath: Secondary | ICD-10-CM | POA: Diagnosis not present

## 2016-06-03 LAB — COMPREHENSIVE METABOLIC PANEL
ALK PHOS: 48 U/L (ref 38–126)
ALT: 31 U/L (ref 17–63)
AST: 20 U/L (ref 15–41)
Albumin: 3.2 g/dL — ABNORMAL LOW (ref 3.5–5.0)
Anion gap: 10 (ref 5–15)
BILIRUBIN TOTAL: 2.9 mg/dL — AB (ref 0.3–1.2)
BUN: 20 mg/dL (ref 6–20)
CALCIUM: 8.4 mg/dL — AB (ref 8.9–10.3)
CO2: 23 mmol/L (ref 22–32)
CREATININE: 1.11 mg/dL (ref 0.61–1.24)
Chloride: 105 mmol/L (ref 101–111)
GFR calc non Af Amer: >60 mL/min (ref >60)
Glucose, Bld: 119 mg/dL — ABNORMAL HIGH (ref 65–99)
Potassium: 3.7 mmol/L (ref 3.5–5.1)
Sodium: 138 mmol/L (ref 135–145)
TOTAL PROTEIN: 6.1 g/dL — AB (ref 6.5–8.1)

## 2016-06-03 LAB — CBC WITH DIFFERENTIAL/PLATELET
BASOS PCT: 0 %
Basophils Absolute: 0 10*3/uL (ref 0.0–0.1)
EOS ABS: 0.1 10*3/uL (ref 0.0–0.7)
EOS PCT: 1 %
HCT: 54 % — ABNORMAL HIGH (ref 39.0–52.0)
Hemoglobin: 17.9 g/dL — ABNORMAL HIGH (ref 13.0–17.0)
LYMPHS ABS: 1.2 10*3/uL (ref 0.7–4.0)
Lymphocytes Relative: 11 %
MCH: 30.8 pg (ref 26.0–34.0)
MCHC: 33.1 g/dL (ref 30.0–36.0)
MCV: 92.9 fL (ref 78.0–100.0)
Monocytes Absolute: 1.3 10*3/uL — ABNORMAL HIGH (ref 0.1–1.0)
Monocytes Relative: 12 %
Neutro Abs: 8.2 10*3/uL — ABNORMAL HIGH (ref 1.7–7.7)
Neutrophils Relative %: 76 %
PLATELETS: 292 10*3/uL (ref 150–400)
RBC: 5.81 MIL/uL (ref 4.22–5.81)
RDW: 14.5 % (ref 11.5–15.5)
WBC: 10.7 10*3/uL — AB (ref 4.0–10.5)

## 2016-06-03 LAB — I-STAT CG4 LACTIC ACID, ED
LACTIC ACID, VENOUS: 1.36 mmol/L (ref 0.5–1.9)
Lactic Acid, Venous: 1.67 mmol/L (ref 0.5–1.9)

## 2016-06-03 LAB — PROTIME-INR
INR: 1.11
PROTHROMBIN TIME: 14.4 s (ref 11.4–15.2)

## 2016-06-03 LAB — GLUCOSE, CAPILLARY: GLUCOSE-CAPILLARY: 113 mg/dL — AB (ref 65–99)

## 2016-06-03 MED ORDER — SODIUM CHLORIDE 0.9 % IV BOLUS (SEPSIS): 1000.0000 mL | Freq: Once | INTRAVENOUS | Status: DC

## 2016-06-03 MED ORDER — ONDANSETRON HCL 4 MG/2ML IJ SOLN
4.0000 mg | Freq: Once | INTRAMUSCULAR | Status: AC
  Administered 2016-06-03: 4 mg via INTRAVENOUS
  Filled 2016-06-03: qty 2

## 2016-06-03 MED ORDER — ACETAMINOPHEN 650 MG RE SUPP: 650.0000 mg | Freq: Four times a day (QID) | RECTAL | Status: DC | PRN

## 2016-06-03 MED ORDER — CALCIUM CARBONATE-VITAMIN D 500-200 MG-UNIT PO TABS
1.0000 | ORAL_TABLET | Freq: Two times a day (BID) | ORAL | Status: DC
  Administered 2016-06-04 – 2016-06-09 (×10): 1 via ORAL
  Filled 2016-06-03 (×10): qty 1

## 2016-06-03 MED ORDER — INSULIN ASPART 100 UNIT/ML ~~LOC~~ SOLN
0.0000 [IU] | Freq: Three times a day (TID) | SUBCUTANEOUS | Status: DC
  Administered 2016-06-04 – 2016-06-09 (×4): 1 [IU] via SUBCUTANEOUS

## 2016-06-03 MED ORDER — SODIUM CHLORIDE 0.9 % IV SOLN
INTRAVENOUS | Status: DC
  Administered 2016-06-04 – 2016-06-07 (×5): via INTRAVENOUS

## 2016-06-03 MED ORDER — VANCOMYCIN HCL IN DEXTROSE 1-5 GM/200ML-% IV SOLN
1000.0000 mg | Freq: Once | INTRAVENOUS | Status: AC
  Administered 2016-06-03: 1000 mg via INTRAVENOUS
  Filled 2016-06-03: qty 200

## 2016-06-03 MED ORDER — ZOLPIDEM TARTRATE 5 MG PO TABS: 5.0000 mg | ORAL_TABLET | Freq: Every evening | ORAL | Status: DC | PRN

## 2016-06-03 MED ORDER — MEMANTINE HCL 10 MG PO TABS
20.0000 mg | ORAL_TABLET | Freq: Two times a day (BID) | ORAL | Status: DC
  Administered 2016-06-04 – 2016-06-05 (×2): 20 mg via ORAL
  Filled 2016-06-03: qty 4
  Filled 2016-06-03 (×2): qty 2
  Filled 2016-06-03: qty 4

## 2016-06-03 MED ORDER — SODIUM CHLORIDE 0.9 % IV BOLUS (SEPSIS)
2000.0000 mL | Freq: Once | INTRAVENOUS | Status: AC
  Administered 2016-06-04: 2000 mL via INTRAVENOUS

## 2016-06-03 MED ORDER — HYDROCODONE-HOMATROPINE 5-1.5 MG/5ML PO SYRP: 5.0000 mL | ORAL_SOLUTION | Freq: Four times a day (QID) | ORAL | Status: DC | PRN

## 2016-06-03 MED ORDER — CINNAMON 500 MG PO CAPS: 500.0000 mg | ORAL_CAPSULE | Freq: Every day | ORAL | Status: DC

## 2016-06-03 MED ORDER — FAMOTIDINE IN NACL 20-0.9 MG/50ML-% IV SOLN
20.0000 mg | Freq: Two times a day (BID) | INTRAVENOUS | Status: DC
  Administered 2016-06-04 – 2016-06-06 (×7): 20 mg via INTRAVENOUS
  Filled 2016-06-03 (×9): qty 50

## 2016-06-03 MED ORDER — MOMETASONE FURO-FORMOTEROL FUM 200-5 MCG/ACT IN AERO
2.0000 | INHALATION_SPRAY | Freq: Two times a day (BID) | RESPIRATORY_TRACT | Status: DC
  Administered 2016-06-04 – 2016-06-09 (×9): 2 via RESPIRATORY_TRACT
  Filled 2016-06-03 (×2): qty 8.8

## 2016-06-03 MED ORDER — MONTELUKAST SODIUM 10 MG PO TABS
10.0000 mg | ORAL_TABLET | Freq: Every day | ORAL | Status: DC
  Administered 2016-06-04 – 2016-06-09 (×6): 10 mg via ORAL
  Filled 2016-06-03 (×6): qty 1

## 2016-06-03 MED ORDER — GABAPENTIN 600 MG PO TABS
1200.0000 mg | ORAL_TABLET | Freq: Three times a day (TID) | ORAL | Status: DC
  Administered 2016-06-04 – 2016-06-09 (×16): 1200 mg via ORAL
  Filled 2016-06-03 (×16): qty 2

## 2016-06-03 MED ORDER — ADULT MULTIVITAMIN W/MINERALS CH
ORAL_TABLET | Freq: Every day | ORAL | Status: DC
  Administered 2016-06-05 – 2016-06-09 (×5): 1 via ORAL
  Filled 2016-06-03 (×5): qty 1

## 2016-06-03 MED ORDER — METRONIDAZOLE IN NACL 5-0.79 MG/ML-% IV SOLN
500.0000 mg | Freq: Once | INTRAVENOUS | Status: AC
  Administered 2016-06-04: 500 mg via INTRAVENOUS
  Filled 2016-06-03: qty 100

## 2016-06-03 MED ORDER — METHYLPREDNISOLONE SODIUM SUCC 125 MG IJ SOLR
60.0000 mg | Freq: Every day | INTRAMUSCULAR | Status: DC
  Administered 2016-06-04 – 2016-06-07 (×4): 60 mg via INTRAVENOUS
  Filled 2016-06-03 (×3): qty 2

## 2016-06-03 MED ORDER — TRIAMCINOLONE ACETONIDE 0.1 % EX CREA
TOPICAL_CREAM | Freq: Two times a day (BID) | CUTANEOUS | Status: DC
  Filled 2016-06-03: qty 15

## 2016-06-03 MED ORDER — ZINC GLUCONATE 50 MG PO TABS: 50.0000 mg | ORAL_TABLET | Freq: Every day | ORAL | Status: DC

## 2016-06-03 MED ORDER — DARIFENACIN HYDROBROMIDE ER 7.5 MG PO TB24
7.5000 mg | ORAL_TABLET | Freq: Every day | ORAL | Status: DC
  Administered 2016-06-04 – 2016-06-09 (×6): 7.5 mg via ORAL
  Filled 2016-06-03 (×6): qty 1

## 2016-06-03 MED ORDER — L-METHYLFOLATE CALCIUM 7.5 MG PO TABS: ORAL_TABLET | Freq: Every day | ORAL | Status: DC

## 2016-06-03 MED ORDER — ENOXAPARIN SODIUM 40 MG/0.4ML ~~LOC~~ SOLN
40.0000 mg | SUBCUTANEOUS | Status: DC
  Administered 2016-06-04: 40 mg via SUBCUTANEOUS
  Filled 2016-06-03: qty 0.4

## 2016-06-03 MED ORDER — DESMOPRESSIN ACETATE 0.2 MG PO TABS
200.0000 ug | ORAL_TABLET | Freq: Every day | ORAL | Status: DC
  Administered 2016-06-04 – 2016-06-08 (×5): 200 ug via ORAL
  Filled 2016-06-03 (×6): qty 1

## 2016-06-03 MED ORDER — ANASTROZOLE 1 MG PO TABS
1.0000 mg | ORAL_TABLET | Freq: Every day | ORAL | Status: DC
  Administered 2016-06-04 – 2016-06-09 (×6): 1 mg via ORAL
  Filled 2016-06-03 (×6): qty 1

## 2016-06-03 MED ORDER — PENTOSAN POLYSULFATE SODIUM 100 MG PO CAPS
200.0000 mg | ORAL_CAPSULE | Freq: Three times a day (TID) | ORAL | Status: DC
  Administered 2016-06-04 – 2016-06-09 (×13): 200 mg via ORAL
  Filled 2016-06-03 (×18): qty 2

## 2016-06-03 MED ORDER — SODIUM CHLORIDE 0.9% FLUSH
3.0000 mL | Freq: Two times a day (BID) | INTRAVENOUS | Status: DC
  Administered 2016-06-04 – 2016-06-08 (×6): 3 mL via INTRAVENOUS

## 2016-06-03 MED ORDER — OXYCODONE-ACETAMINOPHEN 10-325 MG PO TABS: 1.0000 | ORAL_TABLET | ORAL | Status: DC | PRN

## 2016-06-03 MED ORDER — PIPERACILLIN-TAZOBACTAM 3.375 G IVPB
3.3750 g | Freq: Once | INTRAVENOUS | Status: AC
  Administered 2016-06-03: 3.375 g via INTRAVENOUS
  Filled 2016-06-03: qty 50

## 2016-06-03 MED ORDER — PRAVASTATIN SODIUM 40 MG PO TABS
40.0000 mg | ORAL_TABLET | Freq: Every day | ORAL | Status: DC
  Administered 2016-06-04 – 2016-06-08 (×6): 40 mg via ORAL
  Filled 2016-06-03 (×6): qty 1

## 2016-06-03 MED ORDER — DONEPEZIL HCL 23 MG PO TABS
23.0000 mg | ORAL_TABLET | Freq: Every day | ORAL | Status: DC
  Administered 2016-06-04 – 2016-06-08 (×5): 23 mg via ORAL
  Filled 2016-06-03 (×6): qty 1

## 2016-06-03 MED ORDER — ONDANSETRON HCL 4 MG/2ML IJ SOLN: 4.0000 mg | Freq: Three times a day (TID) | INTRAMUSCULAR | Status: DC | PRN

## 2016-06-03 MED ORDER — CYCLOBENZAPRINE HCL 10 MG PO TABS: 10.0000 mg | ORAL_TABLET | Freq: Three times a day (TID) | ORAL | Status: DC | PRN

## 2016-06-03 MED ORDER — GUAIFENESIN ER 600 MG PO TB12
600.0000 mg | ORAL_TABLET | Freq: Two times a day (BID) | ORAL | Status: DC
  Administered 2016-06-04 – 2016-06-09 (×12): 600 mg via ORAL
  Filled 2016-06-03 (×12): qty 1

## 2016-06-03 MED ORDER — VITAMIN D 1000 UNITS PO TABS: 10000.0000 [IU] | ORAL_TABLET | Freq: Two times a day (BID) | ORAL | Status: DC

## 2016-06-03 MED ORDER — SODIUM CHLORIDE 0.9 % IV BOLUS (SEPSIS)
1000.0000 mL | Freq: Once | INTRAVENOUS | Status: AC
  Administered 2016-06-03: 1000 mL via INTRAVENOUS

## 2016-06-03 MED ORDER — TADALAFIL 5 MG PO TABS
5.0000 mg | ORAL_TABLET | Freq: Every evening | ORAL | Status: DC
  Administered 2016-06-04 – 2016-06-08 (×4): 5 mg via ORAL
  Filled 2016-06-03 (×6): qty 1

## 2016-06-03 MED ORDER — LORATADINE 10 MG PO TABS
10.0000 mg | ORAL_TABLET | Freq: Every day | ORAL | Status: DC
  Administered 2016-06-05 – 2016-06-08 (×4): 10 mg via ORAL
  Filled 2016-06-03 (×5): qty 1

## 2016-06-03 MED ORDER — BACLOFEN 10 MG PO TABS: 10.0000 mg | ORAL_TABLET | Freq: Every day | ORAL | Status: DC | PRN

## 2016-06-03 MED ORDER — PHOSPHATIDYLSERINE-DHA-EPA 100-19.5-6.5 MG PO CAPS: 1.0000 | ORAL_CAPSULE | Freq: Two times a day (BID) | ORAL | Status: DC

## 2016-06-03 MED ORDER — SODIUM CHLORIDE 0.9 % IV BOLUS (SEPSIS): 500.0000 mL | Freq: Once | INTRAVENOUS | Status: DC

## 2016-06-03 MED ORDER — ALFUZOSIN HCL ER 10 MG PO TB24
10.0000 mg | ORAL_TABLET | Freq: Every day | ORAL | Status: DC
  Administered 2016-06-04 – 2016-06-09 (×6): 10 mg via ORAL
  Filled 2016-06-03 (×6): qty 1

## 2016-06-03 MED ORDER — ACETAMINOPHEN 325 MG PO TABS
650.0000 mg | ORAL_TABLET | Freq: Four times a day (QID) | ORAL | Status: DC | PRN
  Administered 2016-06-04 – 2016-06-08 (×6): 650 mg via ORAL
  Filled 2016-06-03 (×7): qty 2

## 2016-06-03 MED ORDER — CEREFOLIN NAC 6-90.314-2-600 MG PO TABS: 1.0000 | ORAL_TABLET | Freq: Every day | ORAL | Status: DC

## 2016-06-03 MED ORDER — BENZONATATE 100 MG PO CAPS: 100.0000 mg | ORAL_CAPSULE | ORAL | Status: DC | PRN

## 2016-06-03 MED ORDER — PSEUDOEPH-BROMPHEN-DM 30-2-10 MG/5ML PO SYRP
5.0000 mL | ORAL_SOLUTION | Freq: Four times a day (QID) | ORAL | Status: DC | PRN
Start: 2016-06-03 — End: 2016-06-03

## 2016-06-03 MED ORDER — INSULIN ASPART 100 UNIT/ML ~~LOC~~ SOLN: 0.0000 [IU] | Freq: Every day | SUBCUTANEOUS | Status: DC

## 2016-06-03 MED ORDER — SERTRALINE HCL 50 MG PO TABS
50.0000 mg | ORAL_TABLET | Freq: Two times a day (BID) | ORAL | Status: DC
  Administered 2016-06-04 – 2016-06-09 (×11): 50 mg via ORAL
  Filled 2016-06-03 (×11): qty 1

## 2016-06-03 MED ORDER — LEVOFLOXACIN 500 MG PO TABS
500.0000 mg | ORAL_TABLET | ORAL | Status: DC
  Administered 2016-06-04: 500 mg via ORAL
  Filled 2016-06-03 (×2): qty 1

## 2016-06-03 MED ORDER — ATENOLOL 50 MG PO TABS
100.0000 mg | ORAL_TABLET | Freq: Every day | ORAL | Status: DC
  Administered 2016-06-04 – 2016-06-09 (×6): 100 mg via ORAL
  Filled 2016-06-03 (×2): qty 4
  Filled 2016-06-03 (×2): qty 2
  Filled 2016-06-03: qty 4
  Filled 2016-06-03: qty 2

## 2016-06-03 NOTE — H&P (Signed)
History and Physical    Mark Harrell [id number] DOB: [birth year] DOA: 06/03/2016  Referring MD/NP/PA:   PCP: [name], MD   Patient coming from:  The patient is coming from home.  At baseline, pt is independent for most of ADL.   Chief Complaint: Nausea, vomiting, diarrhea and abdominal pain  HPI: Mark Harrell is a 56 y.o. male with medical history significant of hypertension, hyperlipidemia, COPD, asthma, GERD, depression, prostatitis, BPH, OSA on CPAP, IBS, morbid obesity, who presents with nausea, vomiting, diarrhea and abdominal pain.  Patient was recently hospitalized from 01/04-1/722 COPD/asthma exacerbation. Patient was discharged on Levaquin. Pt states that his respiratory symptoms have improved significantly though he still has some mild cough and mild SOB.  Pt states that he he has nausea, vomiting, diarrhea since yesterday. He vomited 6 times today. He had more than 10 times of diarrhea. He has mild intermittent abdominal pain, which is located in the central abdomen. He has fever and chills. Patient does not have symptoms of UTI, chest pain, unilateral weakness.  ED Course: pt was found to have WBC 10.7, lactate of 1.67, 1.36, electrolytes renal function okay, temperature 101, tachycardia, tachypnea, soft blood pressure. Pt is admitted to tele bed as inpt.   Review of Systems:   General: has fevers, chills, no changes in body weight, has poor appetite, has fatigue HEENT: no blurry vision, hearing changes or sore throat Respiratory: has dyspnea, coughing, no wheezing CV: no chest pain, no palpitations GI: has nausea, vomiting, abdominal pain, diarrhea, no constipation GU: no dysuria, burning on urination, increased urinary frequency, hematuria  Ext: no leg edema Neuro: no unilateral weakness, numbness, or tingling, no vision change or hearing loss Skin: no rash, no skin tear. MSK: No muscle spasm, no deformity, no limitation of range of movement in spin Heme:  No easy bruising.  Travel history: No recent long distant travel.  Allergy:  Allergies  Allergen Reactions  . Bee Venom Swelling  . Cymbalta [Duloxetine Hcl] Other (See Comments)    unknown  . Fenofibrate Other (See Comments)    Back pain  . Ppd [Tuberculin Purified Protein Derivative] Other (See Comments)    +ppd NEG Quantferron Gold 3/13  . Verapamil Other (See Comments)    Back pain    Past Medical History:  Diagnosis Date  . Anesthesia complication requiring reversal agent administration    ? from central apnea, very difficult to get off vent  . Arthritis    osteo  . Asthma   . BPH (benign prostatic hyperplasia)   . Complication of anesthesia    difficulty waking , they twlight me because of my respiratory problems "  . Depression    on zoloft for depression  . GERD (gastroesophageal reflux disease)   . Headache    botox injections for headaches  . Hyperlipidemia   . Hypertension   . Hypogonadism male   . IBS (irritable bowel syndrome)   . Obesity   . OSA (obstructive sleep apnea)   . Pre-diabetes   . Prostatitis     Past Surgical History:  Procedure Laterality Date  . ABDOMINAL SURGERY    . ANKLE FRACTURE SURGERY Right   . CYSTOSCOPY     [place]  . TONSILLECTOMY    . [place] RESECTION  2007  . UVULOPALATOPHARYNGOPLASTY      Social History:  reports that he quit smoking about 21 years ago. He started smoking about 35 years ago. He has a 1.50 pack-year smoking history. He  has never used smokeless tobacco. He reports that he drinks alcohol. He reports that he does not use drugs.  Family History:  Family History  Problem Relation Age of Onset  . Diabetes Paternal Uncle   . Cancer Father     lymphoma, colon  . Diabetes Maternal Grandmother   . Heart disease Maternal Grandfather   . Diabetes Maternal Grandfather   . Diabetes Paternal Grandmother   . Diabetes Paternal Grandfather   . Dementia Mother   . Prostate cancer Maternal Uncle   . Lung  disease Neg Hx   . Rheumatologic disease Neg Hx      Prior to Admission medications   Medication Sig Start Date End Date Taking? Authorizing Provider  albuterol (PROAIR HFA) 108 (90 Base) MCG/ACT inhaler INHALE 2 PUFFS INTO THE LUNGS EVERY 4 (FOUR) HOURS AS NEEDED FOR WHEEZING OR SHORTNESS OF BREATH. 08/13/15   [name], MD  alfuzosin (UROXATRAL) 10 MG 24 hr tablet Take 10 mg by mouth daily.      Historical Provider, MD  anastrozole (ARIMIDEX) 1 MG tablet Take 1 mg by mouth daily. 08/08/15   Historical Provider, MD  atenolol (TENORMIN) 100 MG tablet TAKE 1 TABLET BY MOUTH EVERY DAY 09/26/15   [name], MD  baclofen (LIORESAL) 10 MG tablet Take 10 mg by mouth daily as needed for muscle spasms.  03/03/16   Historical Provider, MD  benzonatate (TESSALON) 200 MG capsule TAKE ONE CAPSULE 3 TIMES A DAY AS NEEDED FOR COUGH 04/29/16   [name], MD  brompheniramine-pseudoephedrine-DM 30-2-10 MG/5ML syrup TAKE 1 TEASPOONFUL 3 TIMES A DAY AS NEEDED Patient taking differently: TAKE 1 TEASPOONFUL 3 TIMES A DAY AS NEEDED FOR COUGH 05/22/16   [name], MD  budesonide-formoterol [facility]) 160-4.5 MCG/ACT inhaler Inhale 2 puffs into the lungs 2 (two) times daily. 04/14/16   [name], MD  Calcium Carbonate-Vitamin D (CALCIUM + D PO) Take 500 mg by mouth 2 (two) times daily.     Historical Provider, MD  cetirizine (ZYRTEC) 10 MG tablet Take 10 mg by mouth daily.    Historical Provider, MD  Cholecalciferol (VITAMIN D PO) Take 10,000-20,000 Units by mouth 2 (two) times daily. 20000 in the morning and 10000 in the evening    Historical Provider, MD  Cinnamon 500 MG capsule Take 500 mg by mouth daily.    Historical Provider, MD  cyclobenzaprine (FLEXERIL) 10 MG tablet Take 10 mg by mouth 3 (three) times daily as needed for muscle spasms.  07/28/13   Historical Provider, MD  desmopressin (DDAVP) 0.2 MG tablet Take 200 mcg by mouth at bedtime. 04/10/14   Historical Provider, MD    donepezil (ARICEPT) 23 MG TABS tablet Take 23 mg by mouth at bedtime.    Historical Provider, MD  DYMISTA 137-50 MCG/ACT SUSP PLACE 2 SPRAYS INTO BOTH NOSTRILS 2 (TWO) TIMES DAILY. 04/10/16   Historical Provider, MD  esomeprazole (NEXIUM) 20 MG capsule TAKE 1 CAPSULE (20 MG TOTAL) BY MOUTH DAILY AT 12 NOON. 04/29/16   [name], MD  gabapentin (NEURONTIN) 600 MG tablet 1,200 mg 3 (three) times daily.  07/28/13   Historical Provider, MD  guaiFENesin (MUCINEX) 600 MG 12 hr tablet Take 600 mg by mouth 2 (two) times daily.     Historical Provider, MD  HYDROcodone-homatropine (HYCODAN) 5-1.5 MG/5ML syrup Take 5 mLs by mouth every 6 (six) hours as needed for cough. 05/27/16   Vicie Mutters, PA-C  L-METHYLFOLATE CALCIUM PO Take 1 tablet by mouth daily.  11/24/13   Historical Provider, MD  levofloxacin (LEVAQUIN) 500 MG tablet Take 1 tablet (500 mg total) by mouth daily. 06/01/16 06/06/16  Ankit Arsenio Loader, MD  memantine (NAMENDA) 10 MG tablet Take 20 mg by mouth 2 (two) times daily.     Historical Provider, MD  metFORMIN (GLUCOPHAGE XR) 500 MG 24 hr tablet Take one tablet twice daily with breakfast and dinner. 05/10/15 05/29/16  Courtney Forcucci, PA-C  Methylfol-Algae-B12-Acetylcyst (CEREFOLIN NAC) 6-90.314-2-600 MG TABS Take 1 tablet by mouth daily.  08/03/12   Historical Provider, MD  montelukast (SINGULAIR) 10 MG tablet Take 1 tablet (10 mg total) by mouth daily. 08/13/15   [name], MD  Multiple Vitamins-Minerals (MULTIVITAMIN WITH MINERALS) tablet Take 1 tablet by mouth daily.    Historical Provider, MD  oxyCODONE-acetaminophen (PERCOCET) 10-325 MG tablet Take 1 tablet by mouth every 4 (four) hours as needed for pain.    Historical Provider, MD  pentosan polysulfate (ELMIRON) 100 MG capsule Take 200 mg by mouth 3 (three) times daily before meals.     Historical Provider, MD  Phosphatidylserine-DHA-EPA (VAYACOG) 100-19.5-6.5 MG CAPS Take 1 capsule by mouth 2 (two) times daily.  07/06/14   Historical  Provider, MD  pravastatin (PRAVACHOL) 40 MG tablet TAKE 1 TABLET BY MOUTH AT BEDTIME 07/17/15   [name], MD  predniSONE (DELTASONE) 20 MG tablet 40 mg oral daily for 4 days then  20 mg oral daily for 2 more days 06/01/16   Ankit Arsenio Loader, MD  sertraline (ZOLOFT) 50 MG tablet Take 50 mg by mouth 2 (two) times daily.     Historical Provider, MD  tadalafil (CIALIS) 5 MG tablet Take 5 mg by mouth every evening.     Historical Provider, MD  triamcinolone cream (KENALOG) 0.1 % APPLY TOPICALLY 4 TIMES A DAY Patient taking differently: APPLY TOPICALLY 4 TIMES A DAY AS NEEDED FOR RASH 04/29/16   [name], MD  trospium (SANCTURA) 20 MG tablet 20 mg 2 (two) times daily.  06/22/15   Historical Provider, MD  zinc gluconate 50 MG tablet Take 50 mg by mouth daily.    Historical Provider, MD    Physical Exam: Vitals:   06/03/16 1915 06/03/16 1930 06/03/16 2143 06/03/16 2341  BP: 125/84 132/83 127/83 127/81  Pulse: 111 115 103 100  Resp: 16 15 20 16   Temp:    99.2 F (37.3 C)  TempSrc:    Oral  SpO2: 93% 93% 95% 97%  Weight:    (!) 164.2 kg (362 lb 1.6 oz)  Height:    6' (1.829 m)   General: Not in acute distress HEENT:       Eyes: PERRL, EOMI, no scleral icterus.       ENT: No discharge from the ears and nose, no pharynx injection, no tonsillar enlargement.        Neck: No JVD, no bruit, no mass felt. Heme: No neck lymph node enlargement. Cardiac: S1/S2, RRR, No murmurs, No gallops or rubs. Respiratory:  No rales, wheezing, rhonchi or rubs. GI: Soft, nondistended, has mild tenderness diffusely, no rebound pain, no organomegaly, BS present. GU: No hematuria Ext: No pitting leg edema bilaterally. 2+DP/PT pulse bilaterally. Musculoskeletal: No joint deformities, No joint redness or warmth, no limitation of ROM in spin. Skin: No rashes.  Neuro: Alert, oriented X3, cranial nerves II-XII grossly intact, moves all extremities normally.  Psych: Patient is not psychotic, no suicidal or  hemocidal ideation.  Labs on Admission: I have personally reviewed following labs and  imaging studies  CBC:  Recent Labs Lab 05/29/16 0638 05/30/16 0609 06/03/16 1852 06/04/16 0025  WBC 14.8* 18.7* 10.7* 9.9  NEUTROABS 10.2*  --  8.2*  --   HGB 14.6 15.4 17.9* 17.0  HCT 44.9 46.6 54.0* 52.4*  MCV 92.0 92.3 92.9 93.9  PLT 286 318 292 [id number]   Basic Metabolic Panel:  Recent Labs Lab 05/29/16 0638 05/30/16 0609 06/03/16 1852 06/04/16 0025  NA 138 136 138 136  K 3.8 4.0 3.7 3.9  CL 101 99* 105 103  CO2 27 28 23  21*  GLUCOSE 115* 156* 119* 95  BUN 17 14 20 17   CREATININE 0.94 0.82 1.11 1.03  CALCIUM 8.4* 8.5* 8.4* 8.0*   GFR: Estimated Creatinine Clearance: 128.6 mL/min (by C-G formula based on SCr of 1.03 mg/dL). Liver Function Tests:  Recent Labs Lab 05/30/16 0609 06/03/16 1852  AST 19 20  ALT 24 31  ALKPHOS 54 48  BILITOT 0.9 2.9*  PROT 5.9* 6.1*  ALBUMIN 3.3* 3.2*   No results for input(s): LIPASE, AMYLASE in the last 168 hours. No results for input(s): AMMONIA in the last 168 hours. Coagulation Profile:  Recent Labs Lab 06/03/16 1852  INR 1.11   Cardiac Enzymes: No results for input(s): CKTOTAL, CKMB, CKMBINDEX, TROPONINI in the last 168 hours. BNP (last 3 results) No results for input(s): PROBNP in the last 8760 hours. HbA1C: No results for input(s): HGBA1C in the last 72 hours. CBG:  Recent Labs Lab 05/31/16 1721 05/31/16 2033 06/01/16 0822 06/01/16 1242 06/03/16 2341  GLUCAP 109* 109* 85 151* 113*   Lipid Profile: No results for input(s): CHOL, HDL, LDLCALC, TRIG, CHOLHDL, LDLDIRECT in the last 72 hours. Thyroid Function Tests: No results for input(s): TSH, T4TOTAL, FREET4, T3FREE, THYROIDAB in the last 72 hours. Anemia Panel: No results for input(s): VITAMINB12, FOLATE, FERRITIN, TIBC, IRON, RETICCTPCT in the last 72 hours. Urine analysis:    Component Value Date/Time   COLORURINE YELLOW 04/23/2016 1702   APPEARANCEUR CLEAR  04/23/2016 1702   LABSPEC 1.023 04/23/2016 1702   PHURINE 5.0 04/23/2016 1702   GLUCOSEU NEGATIVE 04/23/2016 1702   HGBUR NEGATIVE 04/23/2016 1702   BILIRUBINUR NEGATIVE 04/23/2016 1702   KETONESUR NEGATIVE 04/23/2016 1702   PROTEINUR NEGATIVE 04/23/2016 1702   NITRITE NEGATIVE 04/23/2016 1702   LEUKOCYTESUR NEGATIVE 04/23/2016 1702   Sepsis Labs: @LABRCNTIP (procalcitonin:4,lacticidven:4) ) Recent Results (from the past 240 hour(s))  Respiratory Panel by PCR     Status: Abnormal   Collection Time: 05/29/16  1:13 PM  Result Value Ref Range Status   Adenovirus NOT DETECTED NOT DETECTED Final   Coronavirus 229E NOT DETECTED NOT DETECTED Final   Coronavirus HKU1 NOT DETECTED NOT DETECTED Final   Coronavirus NL63 NOT DETECTED NOT DETECTED Final   Coronavirus OC43 NOT DETECTED NOT DETECTED Final   Metapneumovirus NOT DETECTED NOT DETECTED Final   Rhinovirus / Enterovirus NOT DETECTED NOT DETECTED Final   Influenza A NOT DETECTED NOT DETECTED Final   Influenza B NOT DETECTED NOT DETECTED Final   Parainfluenza Virus 1 NOT DETECTED NOT DETECTED Final   Parainfluenza Virus 2 NOT DETECTED NOT DETECTED Final   Parainfluenza Virus 3 NOT DETECTED NOT DETECTED Final   Parainfluenza Virus 4 DETECTED (A) NOT DETECTED Final   Respiratory Syncytial Virus NOT DETECTED NOT DETECTED Final   Bordetella pertussis NOT DETECTED NOT DETECTED Final   Chlamydophila pneumoniae NOT DETECTED NOT DETECTED Final   Mycoplasma pneumoniae NOT DETECTED NOT DETECTED Final     Radiological Exams  on Admission: Dg Chest 2 View  Result Date: 06/03/2016 CLINICAL DATA:  56 y/o M; shortness of breath, diarrhea, and projectile vomiting. EXAM: CHEST  2 VIEW COMPARISON:  05/29/2016 chest radiograph. FINDINGS: Stable mild cardiomegaly given projection and technique. Clear lungs. No pleural effusion. Bones are unremarkable. IMPRESSION: No acute pulmonary process.  Stable mild cardiomegaly. Electronically Signed   By: Kristine Garbe M.D.   On: 06/03/2016 20:48     EKG: Independently reviewed.  Sinus rhythm, QTC 457, low voltage.   Assessment/Plan Principal Problem:   Nausea vomiting and diarrhea Active Problems:   Hypertension   Hyperlipidemia   BPH (benign prostatic hyperplasia)   Depression, controlled   COPD with asthma (HCC)   GERD (gastroesophageal reflux disease)   DM (diabetes mellitus) (HCC)   Sepsis (HCC)   Fever   Nausea vomiting and diarrhea and sepsis: Patient likely has viral gastroenteritis, but need to rule out other possibilities, such as C. difficile colitis. Patient meets criteria for sepsis with leukocytosis, fever, tachycardia, tachypnea. Lactate is normal. bp is soft, but hemodynamically stable.  -will admit to tele bed as inpt. -pt was given one dose of vancomycin and Zosyn in ED, will not continue -will give one dose of flagyl and pending C diff pcr and GI path panel -will get Procalcitonin and trend lactic acid levels per sepsis protocol. -IVF: 3L of NS bolus in ED, followed by 125 cc/h  -f/u Bx and Ux  COPD with asthma: Recently wheezing, symptoms have improved significantly. Patient was discharged on Levaquin -will d/c Levaquin since it may have contributed to diarrhea -switch oral prednisone to IVsolumedrol 60 mg daily as stress dose (pt has soft blood pressure) -check cortisol level  -Dulera inhaler -Orbital nebulizers when necessary -Continue Singulair  HTN: -continue atenolol  BPH:  -pt is taking DDAVP, anastrozole, tadalafil, afluzosin  Depression: no suicidal or homicidal ideations. -Continue home medications: Zoloft   HLD: Last LDL was 58 on 08/13/15 -Continue home medications: Pravastatin 20  GERD: -will switch PPI to pepcid IV until C diff pcr negative  DM-II: Last A1c 5.5 on 04/23/16, well controled. Patient is taking metformin at home -SSI  OSA: -CPAP   DVT ppx:   SQ Lovenox Code Status: Full code Family Communication: Yes,  patient's uncle at bed side Disposition Plan:  Anticipate discharge back to previous home environment Consults called:  none Admission status: Inpatient/tele      Date of Service 06/04/2016    Ivor Costa [facility] Pager [phone]  If 7PM-7AM, please contact night-coverage [email or web address] Password [email or web address] 06/04/2016, 4:26 AM

## 2016-06-03 NOTE — ED Triage Notes (Signed)
Pt presents with weakness, n/v/d, pt released from here 2 days after a 10 day admission for breathing issues. Pt observed by this RN to sit down from a standing position in the waiting room and lay down on the floor. Pt remained alert and oriented x 4. Pt hypotensive and diaphoretic.

## 2016-06-03 NOTE — ED Notes (Signed)
ED Provider at bedside. 

## 2016-06-03 NOTE — ED Notes (Signed)
Pt given PO fluids; OK per MD>

## 2016-06-03 NOTE — ED Notes (Signed)
Bedside commode placed in pt's room 

## 2016-06-03 NOTE — Telephone Encounter (Signed)
Called pt to schedule a follow up appointment from hosp. lmom to call & schedule-sb

## 2016-06-03 NOTE — ED Provider Notes (Signed)
[facility] DEPT Provider Note   CSN: [id number] Arrival date & time: 06/03/16  1816     History   Chief Complaint Chief Complaint  Patient presents with  . Near Syncope  . Weakness  . Emesis    HPI Mark Harrell is a 56 y.o. male.  Patient is a 56 year old male with past medical history of morbid obesity, sleep apnea, hypertension, and recent admission for COPD. He was discharged 2 days ago. He began with nausea, vomiting, diarrhea 24 hours ago. Today he became lightheaded and near syncopal. His vomiting and diarrhea have been nonbloody, nonbilious. He denies any ill contacts and having consumed any suspicious foods. He was discharged on Levaquin from his prior hospitalization.   The history is provided by the patient.  Near Syncope  This is a new problem. Episode onset: 24 hours ago. The problem occurs constantly. The problem has been gradually worsening. Pertinent negatives include no chest pain and no abdominal pain. Nothing aggravates the symptoms. Nothing relieves the symptoms. He has tried nothing for the symptoms.  Weakness  Associated symptoms include vomiting. Pertinent negatives include no chest pain.  Emesis   Pertinent negatives include no abdominal pain.    Past Medical History:  Diagnosis Date  . Anesthesia complication requiring reversal agent administration    ? from central apnea, very difficult to get off vent  . Arthritis    osteo  . Asthma   . BPH (benign prostatic hyperplasia)   . Complication of anesthesia    difficulty waking , they twlight me because of my respiratory problems "  . Depression    on zoloft for depression  . GERD (gastroesophageal reflux disease)   . Headache    botox injections for headaches  . Hyperlipidemia   . Hypertension   . Hypogonadism male   . IBS (irritable bowel syndrome)   . Obesity   . OSA (obstructive sleep apnea)   . Pre-diabetes   . Prostatitis     Patient Active Problem List   Diagnosis Date Noted  .  Acute asthma exacerbation 05/30/2016  . Acute exacerbation of COPD with asthma ([place]) 05/29/2016  . COPD (chronic obstructive pulmonary disease) ([place]) 05/29/2016  . DM (diabetes mellitus) ([place]) 05/29/2016  . Acute medial meniscal tear, left, subsequent encounter 04/23/2016  . Recurrent infections 03/29/2016  . Chronic nonseasonal allergic rhinitis due to pollen 03/29/2016  . Glucose intolerance (impaired glucose tolerance) 01/16/2016  . Polypharmacy 01/16/2016  . BMI 47.46,   adult ([place]) 03/06/2015  . SDAT 02/05/2015  . Asthma-COPD overlap syndrome ([place]) 02/05/2015  . OSA (obstructive sleep apnea) 02/05/2015  . Medication management 08/02/2014  . GERD (gastroesophageal reflux disease) 05/09/2014  . Obesity, Class III, BMI 40-49.9 (morbid obesity) ([place]) 02/01/2014  . Vitamin D deficiency 08/01/2013  . Prediabetes 08/01/2013  . 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 Diverticula of colon 05/07/2011  . Hypertension 01/31/2011  . Hyperlipidemia 01/31/2011  . Migraine 01/31/2011  . BPH (benign prostatic hyperplasia) 01/31/2011  . Testosterone Deficiency 01/31/2011  . IBS (irritable bowel syndrome) 01/31/2011  . Partial complex seizure disorder with intractable epilepsy ([place]) 01/31/2011  . Depression, controlled 01/31/2011  . COPD with asthma ([place]) 01/31/2011    Past Surgical History:  Procedure Laterality Date  . ABDOMINAL SURGERY    . ANKLE FRACTURE SURGERY Right   . CYSTOSCOPY     [place]  . TONSILLECTOMY    . [place] RESECTION  2007  . UVULOPALATOPHARYNGOPLASTY         Home  Medications    Prior to Admission medications   Medication Sig Start Date End Date Taking? Authorizing Provider  albuterol (PROAIR HFA) 108 (90 Base) MCG/ACT inhaler INHALE 2 PUFFS INTO THE LUNGS EVERY 4 (FOUR) HOURS AS NEEDED FOR WHEEZING OR SHORTNESS OF BREATH. 08/13/15   [name], MD  alfuzosin (UROXATRAL) 10 MG 24 hr tablet Take 10 mg by mouth daily.      Historical Provider, MD  anastrozole  (ARIMIDEX) 1 MG tablet Take 1 mg by mouth daily. 08/08/15   Historical Provider, MD  atenolol (TENORMIN) 100 MG tablet TAKE 1 TABLET BY MOUTH EVERY DAY 09/26/15   [name], MD  baclofen (LIORESAL) 10 MG tablet Take 10 mg by mouth daily as needed for muscle spasms.  03/03/16   Historical Provider, MD  benzonatate (TESSALON) 200 MG capsule TAKE ONE CAPSULE 3 TIMES A DAY AS NEEDED FOR COUGH 04/29/16   [name], MD  brompheniramine-pseudoephedrine-DM 30-2-10 MG/5ML syrup TAKE 1 TEASPOONFUL 3 TIMES A DAY AS NEEDED Patient taking differently: TAKE 1 TEASPOONFUL 3 TIMES A DAY AS NEEDED FOR COUGH 05/22/16   [name], MD  budesonide-formoterol [facility]) 160-4.5 MCG/ACT inhaler Inhale 2 puffs into the lungs 2 (two) times daily. 04/14/16   [name], MD  Calcium Carbonate-Vitamin D (CALCIUM + D PO) Take 500 mg by mouth 2 (two) times daily.     Historical Provider, MD  cetirizine (ZYRTEC) 10 MG tablet Take 10 mg by mouth daily.    Historical Provider, MD  Cholecalciferol (VITAMIN D PO) Take 10,000-20,000 Units by mouth 2 (two) times daily. 20000 in the morning and 10000 in the evening    Historical Provider, MD  Cinnamon 500 MG capsule Take 500 mg by mouth daily.    Historical Provider, MD  cyclobenzaprine (FLEXERIL) 10 MG tablet Take 10 mg by mouth 3 (three) times daily as needed for muscle spasms.  07/28/13   Historical Provider, MD  desmopressin (DDAVP) 0.2 MG tablet Take 200 mcg by mouth at bedtime. 04/10/14   Historical Provider, MD  donepezil (ARICEPT) 23 MG TABS tablet Take 23 mg by mouth at bedtime.    Historical Provider, MD  DYMISTA 137-50 MCG/ACT SUSP PLACE 2 SPRAYS INTO BOTH NOSTRILS 2 (TWO) TIMES DAILY. 04/10/16   Historical Provider, MD  esomeprazole (NEXIUM) 20 MG capsule TAKE 1 CAPSULE (20 MG TOTAL) BY MOUTH DAILY AT 12 NOON. 04/29/16   [name], MD  gabapentin (NEURONTIN) 600 MG tablet 1,200 mg 3 (three) times daily.  07/28/13   Historical Provider, MD    guaiFENesin (MUCINEX) 600 MG 12 hr tablet Take 600 mg by mouth 2 (two) times daily.     Historical Provider, MD  HYDROcodone-homatropine (HYCODAN) 5-1.5 MG/5ML syrup Take 5 mLs by mouth every 6 (six) hours as needed for cough. 05/27/16   Vicie Mutters, PA-C  L-METHYLFOLATE CALCIUM PO Take 1 tablet by mouth daily.  11/24/13   Historical Provider, MD  levofloxacin (LEVAQUIN) 500 MG tablet Take 1 tablet (500 mg total) by mouth daily. 06/01/16 06/06/16  Ankit Arsenio Loader, MD  memantine (NAMENDA) 10 MG tablet Take 20 mg by mouth 2 (two) times daily.     Historical Provider, MD  metFORMIN (GLUCOPHAGE XR) 500 MG 24 hr tablet Take one tablet twice daily with breakfast and dinner. 05/10/15 05/29/16  Courtney Forcucci, PA-C  Methylfol-Algae-B12-Acetylcyst (CEREFOLIN NAC) 6-90.314-2-600 MG TABS Take 1 tablet by mouth daily.  08/03/12   Historical Provider, MD  montelukast (SINGULAIR) 10 MG tablet Take 1 tablet (10  mg total) by mouth daily. 08/13/15   [name], MD  Multiple Vitamins-Minerals (MULTIVITAMIN WITH MINERALS) tablet Take 1 tablet by mouth daily.    Historical Provider, MD  oxyCODONE-acetaminophen (PERCOCET) 10-325 MG tablet Take 1 tablet by mouth every 4 (four) hours as needed for pain.    Historical Provider, MD  pentosan polysulfate (ELMIRON) 100 MG capsule Take 200 mg by mouth 3 (three) times daily before meals.     Historical Provider, MD  Phosphatidylserine-DHA-EPA (VAYACOG) 100-19.5-6.5 MG CAPS Take 1 capsule by mouth 2 (two) times daily.  07/06/14   Historical Provider, MD  pravastatin (PRAVACHOL) 40 MG tablet TAKE 1 TABLET BY MOUTH AT BEDTIME 07/17/15   [name], MD  predniSONE (DELTASONE) 20 MG tablet 40 mg oral daily for 4 days then  20 mg oral daily for 2 more days 06/01/16   Ankit Arsenio Loader, MD  sertraline (ZOLOFT) 50 MG tablet Take 50 mg by mouth 2 (two) times daily.     Historical Provider, MD  tadalafil (CIALIS) 5 MG tablet Take 5 mg by mouth every evening.     Historical Provider,  MD  triamcinolone cream (KENALOG) 0.1 % APPLY TOPICALLY 4 TIMES A DAY Patient taking differently: APPLY TOPICALLY 4 TIMES A DAY AS NEEDED FOR RASH 04/29/16   [name], MD  trospium (SANCTURA) 20 MG tablet 20 mg 2 (two) times daily.  06/22/15   Historical Provider, MD  zinc gluconate 50 MG tablet Take 50 mg by mouth daily.    Historical Provider, MD    Family History Family History  Problem Relation Age of Onset  . Diabetes Paternal Uncle   . Cancer Father     lymphoma, colon  . Diabetes Maternal Grandmother   . Heart disease Maternal Grandfather   . Diabetes Maternal Grandfather   . Diabetes Paternal Grandmother   . Diabetes Paternal Grandfather   . Dementia Mother   . Prostate cancer Maternal Uncle   . Lung disease Neg Hx   . Rheumatologic disease Neg Hx     Social History Social History  Substance Use Topics  . Smoking status: Former Smoker    Packs/day: 0.10    Years: 15.00    Start date: 12/14/1980    Quit date: 05/27/1995  . Smokeless tobacco: Never Used     Comment: significant second-hand exposure through mother  . Alcohol use Yes     Comment: rarely     Allergies   Bee venom; Cymbalta [duloxetine hcl]; Fenofibrate; Ppd [tuberculin purified protein derivative]; and Verapamil   Review of Systems Review of Systems  Cardiovascular: Positive for near-syncope. Negative for chest pain.  Gastrointestinal: Positive for vomiting. Negative for abdominal pain.  Neurological: Positive for weakness.  All other systems reviewed and are negative.    Physical Exam Updated Vital Signs BP (!) 90/44 (BP Location: Right Arm)   Pulse 120   Temp 101 F (38.3 C) (Oral)   Resp 26   Ht 6' (1.829 m)   Wt (!) 380 lb (172.4 kg)   SpO2 92%   BMI 51.54 kg/m   Physical Exam  Constitutional: He is oriented to person, place, and time. He appears well-developed and well-nourished. No distress.  HENT:  Head: Normocephalic and atraumatic.  Mouth/Throat: Oropharynx is clear  and moist.  Neck: Normal range of motion. Neck supple.  Cardiovascular: Exam reveals no friction rub.   No murmur heard. Heart is tachycardic, however regular.  Pulmonary/Chest: Effort normal and breath sounds normal. No respiratory distress. He  has no wheezes. He has no rales.  Abdominal: Soft. Bowel sounds are normal. He exhibits no distension. There is no tenderness.  Abdomen is obese, however nontender.  Musculoskeletal: Normal range of motion. He exhibits no edema.  Neurological: He is alert and oriented to person, place, and time. Coordination normal.  Skin: Skin is warm and dry. He is not diaphoretic.  Nursing note and vitals reviewed.    ED Treatments / Results  Labs (all labs ordered are listed, but only abnormal results are displayed) Labs Reviewed  CULTURE, BLOOD (ROUTINE X 2)  CULTURE, BLOOD (ROUTINE X 2)  URINE CULTURE  COMPREHENSIVE METABOLIC PANEL  CBC WITH DIFFERENTIAL/PLATELET  PROTIME-INR  URINALYSIS, ROUTINE W REFLEX MICROSCOPIC  I-STAT CG4 LACTIC ACID, ED  I-STAT CG4 LACTIC ACID, ED    EKG  EKG Interpretation None       Radiology No results found.  Procedures Procedures (including critical care time)  Medications Ordered in ED Medications  ondansetron (ZOFRAN) injection 4 mg (not administered)  sodium chloride 0.9 % bolus 1,000 mL (not administered)    And  sodium chloride 0.9 % bolus 1,000 mL (not administered)    And  sodium chloride 0.9 % bolus 1,000 mL (not administered)    And  sodium chloride 0.9 % bolus 1,000 mL (not administered)    And  sodium chloride 0.9 % bolus 1,000 mL (not administered)    And  sodium chloride 0.9 % bolus 500 mL (not administered)     Initial Impression / Assessment and Plan / ED Course  I have reviewed the triage vital signs and the nursing notes.  Pertinent labs & imaging results that were available during my care of the patient were reviewed by me and considered in my medical decision making (see  chart for details).  Clinical Course     Patient discharged 48 hours ago after a hospitalization for a COPD exacerbation. He returns today complaining of nausea, vomiting, and diarrhea. He is febrile with a temp of 101 and was initially hypotensive and tachycardic. For this reason a code sepsis was initiated and the appropriate tests and interventions were performed. His laboratory studies do not reveal much significantly abnormal or different from prior studies, however due to his initial hypotension, will admit for observation and further workup. Dr. Blaine Hamper agrees to admit.  Final Clinical Impressions(s) / ED Diagnoses   Final diagnoses:  None    New Prescriptions New Prescriptions   No medications on file     Veryl Speak, MD 06/03/16 2042

## 2016-06-04 LAB — CBC
HEMATOCRIT: 52.4 % — AB (ref 39.0–52.0)
HEMOGLOBIN: 17 g/dL (ref 13.0–17.0)
MCH: 30.5 pg (ref 26.0–34.0)
MCHC: 32.4 g/dL (ref 30.0–36.0)
MCV: 93.9 fL (ref 78.0–100.0)
Platelets: 264 10*3/uL (ref 150–400)
RBC: 5.58 MIL/uL (ref 4.22–5.81)
RDW: 14.4 % (ref 11.5–15.5)
WBC: 9.9 10*3/uL (ref 4.0–10.5)

## 2016-06-04 LAB — GASTROINTESTINAL PANEL BY PCR, STOOL (REPLACES STOOL CULTURE)

## 2016-06-04 LAB — CORTISOL-AM, BLOOD: Cortisol - AM: 8.8 ug/dL (ref 6.7–22.6)

## 2016-06-04 LAB — BASIC METABOLIC PANEL
ANION GAP: 12 (ref 5–15)
BUN: 17 mg/dL (ref 6–20)
CHLORIDE: 103 mmol/L (ref 101–111)
CO2: 21 mmol/L — AB (ref 22–32)
Calcium: 8 mg/dL — ABNORMAL LOW (ref 8.9–10.3)
Creatinine, Ser: 1.03 mg/dL (ref 0.61–1.24)
GFR calc Af Amer: >60 mL/min (ref >60)
GFR calc non Af Amer: >60 mL/min (ref >60)
GLUCOSE: 95 mg/dL (ref 65–99)
POTASSIUM: 3.9 mmol/L (ref 3.5–5.1)
Sodium: 136 mmol/L (ref 135–145)

## 2016-06-04 LAB — URINALYSIS, ROUTINE W REFLEX MICROSCOPIC
Bilirubin Urine: NEGATIVE
Glucose, UA: NEGATIVE mg/dL
Hgb urine dipstick: NEGATIVE
Ketones, ur: NEGATIVE mg/dL
LEUKOCYTES UA: NEGATIVE
NITRITE: NEGATIVE
PH: 6 (ref 5.0–8.0)
Protein, ur: NEGATIVE mg/dL
SPECIFIC GRAVITY, URINE: 1.005 (ref 1.005–1.030)

## 2016-06-04 LAB — GLUCOSE, CAPILLARY
GLUCOSE-CAPILLARY: 83 mg/dL (ref 65–99)
GLUCOSE-CAPILLARY: 90 mg/dL (ref 65–99)
Glucose-Capillary: 127 mg/dL — ABNORMAL HIGH (ref 65–99)
Glucose-Capillary: 121 mg/dL — ABNORMAL HIGH (ref 65–99)

## 2016-06-04 LAB — C DIFFICILE QUICK SCREEN W PCR REFLEX
C DIFFICILE (CDIFF) TOXIN: NEGATIVE
C Diff antigen: NEGATIVE
C Diff interpretation: NOT DETECTED

## 2016-06-04 LAB — PROCALCITONIN: Procalcitonin: 1.41 ng/mL

## 2016-06-04 LAB — LACTIC ACID, PLASMA: Lactic Acid, Venous: 1.2 mmol/L (ref 0.5–1.9)

## 2016-06-04 MED ORDER — PIPERACILLIN-TAZOBACTAM 3.375 G IVPB
3.3750 g | Freq: Three times a day (TID) | INTRAVENOUS | Status: DC
  Administered 2016-06-04: 3.375 g via INTRAVENOUS
  Filled 2016-06-04 (×2): qty 50

## 2016-06-04 MED ORDER — ENOXAPARIN SODIUM 80 MG/0.8ML ~~LOC~~ SOLN
80.0000 mg | SUBCUTANEOUS | Status: DC
  Administered 2016-06-05 – 2016-06-09 (×5): 80 mg via SUBCUTANEOUS
  Filled 2016-06-04 (×5): qty 0.8

## 2016-06-04 MED ORDER — OXYCODONE HCL 5 MG PO TABS
5.0000 mg | ORAL_TABLET | ORAL | Status: DC | PRN
  Administered 2016-06-06 – 2016-06-08 (×4): 5 mg via ORAL
  Filled 2016-06-04 (×4): qty 1

## 2016-06-04 MED ORDER — ALBUTEROL SULFATE (2.5 MG/3ML) 0.083% IN NEBU: 2.5000 mg | INHALATION_SOLUTION | RESPIRATORY_TRACT | Status: DC | PRN

## 2016-06-04 MED ORDER — OXYCODONE-ACETAMINOPHEN 5-325 MG PO TABS
1.0000 | ORAL_TABLET | ORAL | Status: DC | PRN
  Administered 2016-06-06 – 2016-06-08 (×3): 1 via ORAL
  Filled 2016-06-04 (×3): qty 1

## 2016-06-04 NOTE — Progress Notes (Signed)
Pt feeling better, tolerating clear liquids, feels hungry he states, wanting to know if he can have crackers. Message sent to Dr Tana Coast, diet changed to full liquids, will inform pt.

## 2016-06-04 NOTE — Progress Notes (Signed)
Notified by lab of pt's stool was positive for norovirus. Dr Tana Coast notified of pt's + results, antibiotics d/c'd.

## 2016-06-04 NOTE — Progress Notes (Signed)
[facility]                                                                              Patient Demographics  Mark Harrell, is a 56 y.o. male, DOB - 1961-04-27, [id number]  Admit date - 06/03/2016   Admitting Physician Ivor Costa, MD  Outpatient Primary MD for the patient is [name], MD  Outpatient specialists:   LOS - 1  days    Chief Complaint  Patient presents with  . Near Syncope  . Weakness  . Emesis       Brief summary   Patient is a 56 year old male with hypertension, hyperlipidemia, COPD, GERD presented with nausea, vomiting, diarrhea, abdominal pain. Patient was recently hospitalized from 1/4-1/7 for COPD exacerbation and was discharged on Levaquin. Respiratory symptoms have resolved.   Assessment & Plan    Nausea vomiting and diarrhea and sepsis:  Patient meets criteria for sepsis with leukocytosis, fever, tachycardia, tachypnea. Lactate is normal.  - C. difficile negative - Placed on IV Zosyn, continue IV fluid hydration, placed on clear liquid diet, per patient nausea and vomiting improving - Still having abdominal discomfort, obtain CT abdomen and pelvis to rule out colitis - Follow blood cultures  COPD with asthma: Recently wheezing, symptoms have improved significantly. Patient was discharged on Levaquin - Currently stable, no wheezing, continue nebulizers prn, dulera - Continue IV Solu-Medrol  HTN: -continue atenolol  BPH:  -pt is taking DDAVP, anastrozole, tadalafil, afluzosin  Depression: no suicidal or homicidal ideations. -Continue home medications: Zoloft   HLD: Last LDL was 58 on 08/13/15 -Continue Pravastatin 20  GERD: -will switch PPI to pepcid IV  DM-II: Last A1c 5.5 on 04/23/16, well controled. Patient is taking metformin at home -SSI  OSA: -CPAP  Code Status: Full CODE STATUS DVT Prophylaxis:  Lovenox  Family Communication: Discussed in detail with the patient, all imaging results, lab  results explained to the patient   Disposition Plan:  Time Spent in minutes   25 minutes  Procedures:    Consultants:     Antimicrobials:   IV Zosyn 1/9  Vancomycin 1/9 x1   Medications  Scheduled Meds: . alfuzosin  10 mg Oral Daily  . anastrozole  1 mg Oral Daily  . atenolol  100 mg Oral Daily  . calcium-vitamin D  1 tablet Oral BID  . cholecalciferol  10,000-20,000 Units Oral BID  . darifenacin  7.5 mg Oral Daily  . desmopressin  200 mcg Oral QHS  . donepezil  23 mg Oral QHS  . enoxaparin (LOVENOX) injection  40 mg Subcutaneous Q24H  . famotidine (PEPCID) IV  20 mg Intravenous Q12H  . gabapentin  1,200 mg Oral TID  . guaiFENesin  600 mg Oral BID  . insulin aspart  0-5 Units Subcutaneous QHS  . insulin aspart  0-9 Units Subcutaneous TID WC  . loratadine  10 mg Oral Daily  . memantine  20 mg Oral BID  . methylPREDNISolone (SOLU-MEDROL) injection  60 mg Intravenous Daily  . mometasone-formoterol  2 puff Inhalation BID  . montelukast  10 mg Oral Daily  . multivitamin with minerals   Oral Daily  .  pentosan polysulfate  200 mg Oral TID AC  . piperacillin-tazobactam (ZOSYN)  IV  3.375 g Intravenous Q8H  . pravastatin  40 mg Oral QHS  . sertraline  50 mg Oral BID  . sodium chloride flush  3 mL Intravenous Q12H  . tadalafil  5 mg Oral QPM  . triamcinolone cream   Topical BID   Continuous Infusions: . sodium chloride 125 mL/hr at 06/04/16 0256   PRN Meds:.acetaminophen **OR** acetaminophen, albuterol, benzonatate, cyclobenzaprine, HYDROcodone-homatropine, ondansetron (ZOFRAN) IV, oxyCODONE-acetaminophen **AND** oxyCODONE, zolpidem   Antibiotics   Anti-infectives    Start     Dose/Rate Route Frequency Ordered Stop   06/04/16 0830  piperacillin-tazobactam (ZOSYN) IVPB 3.375 g     3.375 g 12.5 mL/hr over 240 Minutes Intravenous Every 8 hours 06/04/16 0824     06/03/16 2200  levofloxacin (LEVAQUIN) tablet 500 mg  Status:  Discontinued     500 mg Oral Every 24 hours  06/03/16 2121 06/04/16 0427   06/03/16 2200  metroNIDAZOLE (FLAGYL) IVPB 500 mg     500 mg 100 mL/hr over 60 Minutes Intravenous Once 06/03/16 2121 06/04/16 0239   06/03/16 1915  vancomycin (VANCOCIN) IVPB 1000 mg/200 mL premix     1,000 mg 200 mL/hr over 60 Minutes Intravenous  Once 06/03/16 1912 06/03/16 2142   06/03/16 1915  piperacillin-tazobactam (ZOSYN) IVPB 3.375 g     3.375 g 12.5 mL/hr over 240 Minutes Intravenous  Once 06/03/16 1912 06/03/16 2000        Subjective:   Mark Harrell was seen and examined today.  No vomiting but still feels nauseous, abdominal discomfort with cramping diffusely. Patient denies dizziness, chest pain, shortness of breath,  new weakness, numbess, tingling. No acute events overnight.    Objective:   Vitals:   06/03/16 2143 06/03/16 2341 06/04/16 0522 06/04/16 0745  BP: 127/83 127/81 133/83   Pulse: 103 100 98   Resp: 20 16    Temp:  99.2 F (37.3 C) 98.8 F (37.1 C)   TempSrc:  Oral Oral   SpO2: 95% 97% 96% 96%  Weight:  (!) 164.2 kg (362 lb 1.6 oz)    Height:  6' (1.829 m)      Intake/Output Summary (Last 24 hours) at 06/04/16 1149 Last data filed at 06/04/16 0900  Gross per 24 hour  Intake             1660 ml  Output                0 ml  Net             1660 ml     Wt Readings from Last 3 Encounters:  06/03/16 (!) 164.2 kg (362 lb 1.6 oz)  06/01/16 (!) 172.8 kg (380 lb 14.2 oz)  05/27/16 (!) 174.6 kg (385 lb)     Exam  General: Alert and oriented x 3, NAD  HEENT:    Neck: Supple, no JVD, no masses  Cardiovascular: S1 S2 auscultated, no rubs, murmurs or gallops. Regular rate and rhythm.  Respiratory: Clear to auscultation bilaterally, no wheezing, rales or rhonchi  Gastrointestinal: Soft, Mild diffuse tenderness, nondistended, + bowel sounds  Ext: no cyanosis clubbing or edema  Neuro: AAOx3, Cr N's II- XII. Strength 5/5 upper and lower extremities bilaterally  Skin: No rashes  Psych: Normal affect and demeanor,  alert and oriented x3    Data Reviewed:  I have personally reviewed following labs and imaging studies  Micro Results Recent Results (  from the past 240 hour(s))  Respiratory Panel by PCR     Status: Abnormal   Collection Time: 05/29/16  1:13 PM  Result Value Ref Range Status   Adenovirus NOT DETECTED NOT DETECTED Final   Coronavirus 229E NOT DETECTED NOT DETECTED Final   Coronavirus HKU1 NOT DETECTED NOT DETECTED Final   Coronavirus NL63 NOT DETECTED NOT DETECTED Final   Coronavirus OC43 NOT DETECTED NOT DETECTED Final   Metapneumovirus NOT DETECTED NOT DETECTED Final   Rhinovirus / Enterovirus NOT DETECTED NOT DETECTED Final   Influenza A NOT DETECTED NOT DETECTED Final   Influenza B NOT DETECTED NOT DETECTED Final   Parainfluenza Virus 1 NOT DETECTED NOT DETECTED Final   Parainfluenza Virus 2 NOT DETECTED NOT DETECTED Final   Parainfluenza Virus 3 NOT DETECTED NOT DETECTED Final   Parainfluenza Virus 4 DETECTED (A) NOT DETECTED Final   Respiratory Syncytial Virus NOT DETECTED NOT DETECTED Final   Bordetella pertussis NOT DETECTED NOT DETECTED Final   Chlamydophila pneumoniae NOT DETECTED NOT DETECTED Final   Mycoplasma pneumoniae NOT DETECTED NOT DETECTED Final  C difficile quick scan w PCR reflex     Status: None   Collection Time: 06/04/16 12:15 AM  Result Value Ref Range Status   C Diff antigen NEGATIVE NEGATIVE Final   C Diff toxin NEGATIVE NEGATIVE Final   C Diff interpretation No C. difficile detected.  Final    Radiology Reports Dg Chest 2 View  Result Date: 06/03/2016 CLINICAL DATA:  56 y/o M; shortness of breath, diarrhea, and projectile vomiting. EXAM: CHEST  2 VIEW COMPARISON:  05/29/2016 chest radiograph. FINDINGS: Stable mild cardiomegaly given projection and technique. Clear lungs. No pleural effusion. Bones are unremarkable. IMPRESSION: No acute pulmonary process.  Stable mild cardiomegaly. Electronically Signed   By: Kristine Garbe M.D.   On:  06/03/2016 20:48   Dg Chest 2 View  Result Date: 05/29/2016 CLINICAL DATA:  Cough for 1 week. EXAM: CHEST  2 VIEW COMPARISON:  05/25/2016 FINDINGS: The cardiac silhouette remains borderline enlarged. There is no evidence of acute airspace consolidation, edema, pleural effusion, or pneumothorax. No acute osseous abnormality is identified. IMPRESSION: No active cardiopulmonary disease. Electronically Signed   By: Logan Bores M.D.   On: 05/29/2016 06:55   Dg Chest 2 View  Result Date: 05/25/2016 CLINICAL DATA:  Cough, shortness of breath. EXAM: CHEST  2 VIEW COMPARISON:  Radiographs of February 13, 2016. FINDINGS: Stable cardiomediastinal silhouette. No pneumothorax or pleural effusion is noted. Both lungs are clear. The visualized skeletal structures are unremarkable. IMPRESSION: No active cardiopulmonary disease. Electronically Signed   By: Marijo Conception, M.D.   On: 05/25/2016 13:17    Lab Data:  CBC:  Recent Labs Lab 05/29/16 0638 05/30/16 0609 06/03/16 1852 06/04/16 0025  WBC 14.8* 18.7* 10.7* 9.9  NEUTROABS 10.2*  --  8.2*  --   HGB 14.6 15.4 17.9* 17.0  HCT 44.9 46.6 54.0* 52.4*  MCV 92.0 92.3 92.9 93.9  PLT 286 318 292 [id number]   Basic Metabolic Panel:  Recent Labs Lab 05/29/16 0638 05/30/16 0609 06/03/16 1852 06/04/16 0025  NA 138 136 138 136  K 3.8 4.0 3.7 3.9  CL 101 99* 105 103  CO2 27 28 23  21*  GLUCOSE 115* 156* 119* 95  BUN 17 14 20 17   CREATININE 0.94 0.82 1.11 1.03  CALCIUM 8.4* 8.5* 8.4* 8.0*   GFR: Estimated Creatinine Clearance: 128.6 mL/min (by C-G formula based on SCr of 1.03 mg/dL). Liver Function  Tests:  Recent Labs Lab 05/30/16 0609 06/03/16 1852  AST 19 20  ALT 24 31  ALKPHOS 54 48  BILITOT 0.9 2.9*  PROT 5.9* 6.1*  ALBUMIN 3.3* 3.2*   No results for input(s): LIPASE, AMYLASE in the last 168 hours. No results for input(s): AMMONIA in the last 168 hours. Coagulation Profile:  Recent Labs Lab 06/03/16 1852  INR 1.11   Cardiac  Enzymes: No results for input(s): CKTOTAL, CKMB, CKMBINDEX, TROPONINI in the last 168 hours. BNP (last 3 results) No results for input(s): PROBNP in the last 8760 hours. HbA1C: No results for input(s): HGBA1C in the last 72 hours. CBG:  Recent Labs Lab 06/01/16 0822 06/01/16 1242 06/03/16 2341 06/04/16 0820 06/04/16 1131  GLUCAP 85 151* 113* 83 90   Lipid Profile: No results for input(s): CHOL, HDL, LDLCALC, TRIG, CHOLHDL, LDLDIRECT in the last 72 hours. Thyroid Function Tests: No results for input(s): TSH, T4TOTAL, FREET4, T3FREE, THYROIDAB in the last 72 hours. Anemia Panel: No results for input(s): VITAMINB12, FOLATE, FERRITIN, TIBC, IRON, RETICCTPCT in the last 72 hours. Urine analysis:    Component Value Date/Time   COLORURINE YELLOW 04/23/2016 1702   APPEARANCEUR CLEAR 04/23/2016 1702   LABSPEC 1.023 04/23/2016 1702   PHURINE 5.0 04/23/2016 1702   GLUCOSEU NEGATIVE 04/23/2016 1702   HGBUR NEGATIVE 04/23/2016 1702   BILIRUBINUR NEGATIVE 04/23/2016 1702   KETONESUR NEGATIVE 04/23/2016 1702   PROTEINUR NEGATIVE 04/23/2016 1702   NITRITE NEGATIVE 04/23/2016 1702   LEUKOCYTESUR NEGATIVE 04/23/2016 1702     Yamilka Lopiccolo M.D. [facility] 06/04/2016, 11:49 AM  Pager: [phone] Between 7am to 7pm - call Pager - [phone]  After 7pm go to [email or web address] - password [email or web address]  Call night coverage person covering after 7pm

## 2016-06-04 NOTE — Progress Notes (Signed)
Pharmacy Antibiotic Note  Mark Harrell is a 56 y.o. male admitted on 06/03/2016 with sepsis.  Pharmacy has been consulted for Zosyn dosing. Recently treated for COPD exacerbation with Levaquin. Tmax/24h 101, WBC 9.9, CrCl>100.  Received 1x dose of Zosyn 30 minute infusion on 1/9.  Plan: Zosyn 3.375g IV q8h (4h inf) Monitor clinical progress, c/s, renal function, abx plan/LOT   Height: 6' (182.9 cm) Weight: (!) 362 lb 1.6 oz (164.2 kg) IBW/kg (Calculated) : 77.6  Temp (24hrs), Avg:99.7 F (37.6 C), Min:98.8 F (37.1 C), Max:101 F (38.3 C)   Recent Labs Lab 05/29/16 0638 05/30/16 0609 06/03/16 1852 06/03/16 1859 06/03/16 2002 06/04/16 0011 06/04/16 0025  WBC 14.8* 18.7* 10.7*  --   --   --  9.9  CREATININE 0.94 0.82 1.11  --   --   --  1.03  LATICACIDVEN  --   --   --  1.67 1.36 1.2  --     Estimated Creatinine Clearance: 128.6 mL/min (by C-G formula based on SCr of 1.03 mg/dL).    Allergies  Allergen Reactions  . Bee Venom Swelling  . Cymbalta [Duloxetine Hcl] Other (See Comments)    unknown  . Fenofibrate Other (See Comments)    Back pain  . Ppd [Tuberculin Purified Protein Derivative] Other (See Comments)    +ppd NEG Quantferron Gold 3/13  . Verapamil Other (See Comments)    Back pain    Elicia Lamp, PharmD, BCPS Clinical Pharmacist 06/04/2016 8:26 AM

## 2016-06-05 LAB — BASIC METABOLIC PANEL
ANION GAP: 7 (ref 5–15)
BUN: 8 mg/dL (ref 6–20)
CALCIUM: 7.8 mg/dL — AB (ref 8.9–10.3)
CO2: 26 mmol/L (ref 22–32)
Chloride: 105 mmol/L (ref 101–111)
Creatinine, Ser: 0.81 mg/dL (ref 0.61–1.24)
Glucose, Bld: 96 mg/dL (ref 65–99)
POTASSIUM: 3.5 mmol/L (ref 3.5–5.1)
Sodium: 138 mmol/L (ref 135–145)

## 2016-06-05 LAB — GLUCOSE, CAPILLARY
GLUCOSE-CAPILLARY: 108 mg/dL — AB (ref 65–99)
GLUCOSE-CAPILLARY: 127 mg/dL — AB (ref 65–99)
Glucose-Capillary: 95 mg/dL (ref 65–99)
Glucose-Capillary: 101 mg/dL — ABNORMAL HIGH (ref 65–99)

## 2016-06-05 MED ORDER — ONDANSETRON 4 MG PO TBDP: 4.0000 mg | ORAL_TABLET | Freq: Four times a day (QID) | ORAL | 0 refills | Status: DC | PRN

## 2016-06-05 MED ORDER — MEMANTINE HCL 10 MG PO TABS
10.0000 mg | ORAL_TABLET | Freq: Two times a day (BID) | ORAL | Status: DC
  Administered 2016-06-05 – 2016-06-09 (×8): 10 mg via ORAL
  Filled 2016-06-05 (×3): qty 1
  Filled 2016-06-05: qty 2
  Filled 2016-06-05 (×3): qty 1

## 2016-06-05 NOTE — Progress Notes (Signed)
[facility]                                                                              Patient Demographics  Mark Harrell, is a 56 y.o. male, DOB - 1961/01/17, NZ:[id number]  Admit date - 06/03/2016   Admitting Physician Ivor Costa, MD  Outpatient Primary MD for the patient is [name], MD  Outpatient specialists:   LOS - 2  days    Chief Complaint  Patient presents with  . Near Syncope  . Weakness  . Emesis       Brief summary   Patient is a 56 year old male with hypertension, hyperlipidemia, COPD, GERD presented with nausea, vomiting, diarrhea, abdominal pain. Patient was recently hospitalized from 1/4-1/7 for COPD exacerbation and was discharged on Levaquin. Respiratory symptoms have resolved.   Assessment & Plan    Nausea vomiting and diarrhea and sepsis:  Patient meets criteria for sepsis with leukocytosis, fever, tachycardia, tachypnea. Lactate is normal.  - C. difficile negative - GI pathogen panel positive for noro virus. Explained precautionary measures to the patient and for his family. - Overnight had 3 bowel movements, wants to try solid food. Already had 2 bowel movements after breakfast - Continue IV fluid hydration  COPD with asthma: Recently wheezing, symptoms have improved significantly. Patient was discharged on Levaquin - Currently stable, no wheezing, continue nebulizers prn, dulera - Continue IV Solu-Medrol  HTN: -continue atenolol  BPH:  -pt is taking DDAVP, anastrozole, tadalafil, afluzosin  Depression: no suicidal or homicidal ideations. -Continue home medications: Zoloft   HLD: Last LDL was 58 on 08/13/15 -Continue Pravastatin 20  GERD: -will switch PPI to pepcid IV  DM-II: Last A1c 5.5 on 04/23/16, well controled. Patient is taking metformin at home -SSI  OSA: -CPAP  Code Status: Full CODE STATUS DVT Prophylaxis:  Lovenox  Family Communication: Discussed in detail with the patient, all  imaging results, lab results explained to the patient   Disposition Plan: If diarrhea improves and tolerating solid diet, hopefully will DC home in a.m.  Time Spent in minutes   25 minutes  Procedures:    Consultants:     Antimicrobials:   IV Zosyn 1/9> 1/10  Vancomycin 1/9 x1   Medications  Scheduled Meds: . alfuzosin  10 mg Oral Daily  . anastrozole  1 mg Oral Daily  . atenolol  100 mg Oral Daily  . calcium-vitamin D  1 tablet Oral BID  . darifenacin  7.5 mg Oral Daily  . desmopressin  200 mcg Oral QHS  . donepezil  23 mg Oral QHS  . enoxaparin (LOVENOX) injection  80 mg Subcutaneous Q24H  . famotidine (PEPCID) IV  20 mg Intravenous Q12H  . gabapentin  1,200 mg Oral TID  . guaiFENesin  600 mg Oral BID  . insulin aspart  0-5 Units Subcutaneous QHS  . insulin aspart  0-9 Units Subcutaneous TID WC  . loratadine  10 mg Oral Daily  . memantine  10 mg Oral BID  . methylPREDNISolone (SOLU-MEDROL) injection  60 mg Intravenous Daily  . mometasone-formoterol  2 puff Inhalation BID  . montelukast  10 mg Oral Daily  .  multivitamin with minerals   Oral Daily  . pentosan polysulfate  200 mg Oral TID AC  . pravastatin  40 mg Oral QHS  . sertraline  50 mg Oral BID  . sodium chloride flush  3 mL Intravenous Q12H  . tadalafil  5 mg Oral QPM  . triamcinolone cream   Topical BID   Continuous Infusions: . sodium chloride 125 mL/hr at 06/04/16 1240   PRN Meds:.acetaminophen **OR** acetaminophen, albuterol, benzonatate, cyclobenzaprine, HYDROcodone-homatropine, ondansetron (ZOFRAN) IV, oxyCODONE-acetaminophen **AND** oxyCODONE, zolpidem   Antibiotics   Anti-infectives    Start     Dose/Rate Route Frequency Ordered Stop   06/04/16 0830  piperacillin-tazobactam (ZOSYN) IVPB 3.375 g  Status:  Discontinued     3.375 g 12.5 mL/hr over 240 Minutes Intravenous Every 8 hours 06/04/16 0824 06/04/16 1420   06/03/16 2200  levofloxacin (LEVAQUIN) tablet 500 mg  Status:  Discontinued      500 mg Oral Every 24 hours 06/03/16 2121 06/04/16 0427   06/03/16 2200  metroNIDAZOLE (FLAGYL) IVPB 500 mg     500 mg 100 mL/hr over 60 Minutes Intravenous Once 06/03/16 2121 06/04/16 0239   06/03/16 1915  vancomycin (VANCOCIN) IVPB 1000 mg/200 mL premix     1,000 mg 200 mL/hr over 60 Minutes Intravenous  Once 06/03/16 1912 06/03/16 2142   06/03/16 1915  piperacillin-tazobactam (ZOSYN) IVPB 3.375 g     3.375 g 12.5 mL/hr over 240 Minutes Intravenous  Once 06/03/16 1912 06/03/16 2000        Subjective:   [name] was seen and examined today.  No nausea or vomiting, diarrhea still persisting. Abdominal cramping, improving Patient denies dizziness, chest pain, shortness of breath,  new weakness, numbess, tingling. No acute events overnight.    Objective:   Vitals:   06/04/16 1446 06/04/16 1944 06/04/16 2155 06/05/16 0455  BP: 122/74  136/79 136/79  Pulse: 83  81 78  Resp: 20  20 18   Temp: 98.6 F (37 C)  98.4 F (36.9 C) 98.5 F (36.9 C)  TempSrc: Oral  Oral Oral  SpO2: 96% 98% 97% 97%  Weight:    (!) 167.7 kg (369 lb 12.8 oz)  Height:        Intake/Output Summary (Last 24 hours) at 06/05/16 1227 Last data filed at 06/05/16 0914  Gross per 24 hour  Intake             2470 ml  Output              525 ml  Net             1945 ml     Wt Readings from Last 3 Encounters:  06/05/16 (!) 167.7 kg (369 lb 12.8 oz)  06/01/16 (!) 172.8 kg (380 lb 14.2 oz)  05/27/16 (!) 174.6 kg (385 lb)     Exam  General: Alert and oriented x 3, NAD  HEENT:    Neck:   Cardiovascular: S1 S2 auscultated, no rubs, murmurs or gallops. Regular rate and rhythm.  Respiratory: Clear to auscultation bilaterally, no wheezing, rales or rhonchi  Gastrointestinal: Soft, Nontender, nondistended, + bowel sounds  Ext: no cyanosis clubbing or edema  Neuro: No new deficits  Skin: No rashes  Psych: Normal affect and demeanor, alert and oriented x3    Data Reviewed:  I have personally  reviewed following labs and imaging studies  Micro Results Recent Results (from the past 240 hour(s))  Respiratory Panel by PCR     Status:  Abnormal   Collection Time: 05/29/16  1:13 PM  Result Value Ref Range Status   Adenovirus NOT DETECTED NOT DETECTED Final   Coronavirus 229E NOT DETECTED NOT DETECTED Final   Coronavirus HKU1 NOT DETECTED NOT DETECTED Final   Coronavirus NL63 NOT DETECTED NOT DETECTED Final   Coronavirus OC43 NOT DETECTED NOT DETECTED Final   Metapneumovirus NOT DETECTED NOT DETECTED Final   Rhinovirus / Enterovirus NOT DETECTED NOT DETECTED Final   Influenza A NOT DETECTED NOT DETECTED Final   Influenza B NOT DETECTED NOT DETECTED Final   Parainfluenza Virus 1 NOT DETECTED NOT DETECTED Final   Parainfluenza Virus 2 NOT DETECTED NOT DETECTED Final   Parainfluenza Virus 3 NOT DETECTED NOT DETECTED Final   Parainfluenza Virus 4 DETECTED (A) NOT DETECTED Final   Respiratory Syncytial Virus NOT DETECTED NOT DETECTED Final   Bordetella pertussis NOT DETECTED NOT DETECTED Final   Chlamydophila pneumoniae NOT DETECTED NOT DETECTED Final   Mycoplasma pneumoniae NOT DETECTED NOT DETECTED Final  Culture, blood (Routine x 2)     Status: None (Preliminary result)   Collection Time: 06/03/16  6:53 PM  Result Value Ref Range Status   Specimen Description BLOOD LEFT HAND  Final   Special Requests BOTTLES DRAWN AEROBIC AND ANAEROBIC 6CC  Final   Culture NO GROWTH < 24 HOURS  Final   Report Status PENDING  Incomplete  Culture, blood (Routine x 2)     Status: None (Preliminary result)   Collection Time: 06/03/16  6:56 PM  Result Value Ref Range Status   Specimen Description BLOOD RIGHT HAND  Final   Special Requests BOTTLES DRAWN AEROBIC AND ANAEROBIC 6CC  Final   Culture NO GROWTH < 24 HOURS  Final   Report Status PENDING  Incomplete  C difficile quick scan w PCR reflex     Status: None   Collection Time: 06/04/16 12:15 AM  Result Value Ref Range Status   C Diff antigen  NEGATIVE NEGATIVE Final   C Diff toxin NEGATIVE NEGATIVE Final   C Diff interpretation No C. difficile detected.  Final  Gastrointestinal Panel by PCR , Stool     Status: Abnormal   Collection Time: 06/04/16 12:15 AM  Result Value Ref Range Status   Campylobacter species NOT DETECTED NOT DETECTED Final   Plesimonas shigelloides NOT DETECTED NOT DETECTED Final   Salmonella species NOT DETECTED NOT DETECTED Final   Yersinia enterocolitica NOT DETECTED NOT DETECTED Final   Vibrio species NOT DETECTED NOT DETECTED Final   Vibrio cholerae NOT DETECTED NOT DETECTED Final   Enteroaggregative E coli (EAEC) NOT DETECTED NOT DETECTED Final   Enteropathogenic E coli (EPEC) NOT DETECTED NOT DETECTED Final   Enterotoxigenic E coli (ETEC) NOT DETECTED NOT DETECTED Final   Shiga like toxin producing E coli (STEC) NOT DETECTED NOT DETECTED Final   Shigella/Enteroinvasive E coli (EIEC) NOT DETECTED NOT DETECTED Final   Cryptosporidium NOT DETECTED NOT DETECTED Final   Cyclospora cayetanensis NOT DETECTED NOT DETECTED Final   Entamoeba histolytica NOT DETECTED NOT DETECTED Final   Giardia lamblia NOT DETECTED NOT DETECTED Final   Adenovirus F40/41 NOT DETECTED NOT DETECTED Final   Astrovirus NOT DETECTED NOT DETECTED Final   Norovirus GI/GII DETECTED (A) NOT DETECTED Final    Comment: CRITICAL RESULT CALLED TO, READ BACK BY AND VERIFIED WITH: TAMMY PENNINGTON 06/04/16 1350 SGD    Rotavirus A NOT DETECTED NOT DETECTED Final   Sapovirus (I, II, IV, and V) NOT DETECTED NOT DETECTED Final  Radiology Reports Dg Chest 2 View  Result Date: 06/03/2016 CLINICAL DATA:  56 y/o M; shortness of breath, diarrhea, and projectile vomiting. EXAM: CHEST  2 VIEW COMPARISON:  05/29/2016 chest radiograph. FINDINGS: Stable mild cardiomegaly given projection and technique. Clear lungs. No pleural effusion. Bones are unremarkable. IMPRESSION: No acute pulmonary process.  Stable mild cardiomegaly. Electronically Signed   By:  Kristine Garbe M.D.   On: 06/03/2016 20:48   Dg Chest 2 View  Result Date: 05/29/2016 CLINICAL DATA:  Cough for 1 week. EXAM: CHEST  2 VIEW COMPARISON:  05/25/2016 FINDINGS: The cardiac silhouette remains borderline enlarged. There is no evidence of acute airspace consolidation, edema, pleural effusion, or pneumothorax. No acute osseous abnormality is identified. IMPRESSION: No active cardiopulmonary disease. Electronically Signed   By: Logan Bores M.D.   On: 05/29/2016 06:55   Dg Chest 2 View  Result Date: 05/25/2016 CLINICAL DATA:  Cough, shortness of breath. EXAM: CHEST  2 VIEW COMPARISON:  Radiographs of February 13, 2016. FINDINGS: Stable cardiomediastinal silhouette. No pneumothorax or pleural effusion is noted. Both lungs are clear. The visualized skeletal structures are unremarkable. IMPRESSION: No active cardiopulmonary disease. Electronically Signed   By: Marijo Conception, M.D.   On: 05/25/2016 13:17    Lab Data:  CBC:  Recent Labs Lab 05/30/16 0609 06/03/16 1852 06/04/16 0025  WBC 18.7* 10.7* 9.9  NEUTROABS  --  8.2*  --   HGB 15.4 17.9* 17.0  HCT 46.6 54.0* 52.4*  MCV 92.3 92.9 93.9  PLT 318 292 [id number]   Basic Metabolic Panel:  Recent Labs Lab 05/30/16 0609 06/03/16 1852 06/04/16 0025 06/05/16 0440  NA 136 138 136 138  K 4.0 3.7 3.9 3.5  CL 99* 105 103 105  CO2 28 23 21* 26  GLUCOSE 156* 119* 95 96  BUN 14 20 17 8   CREATININE 0.82 1.11 1.03 0.81  CALCIUM 8.5* 8.4* 8.0* 7.8*   GFR: Estimated Creatinine Clearance: 165.6 mL/min (by C-G formula based on SCr of 0.81 mg/dL). Liver Function Tests:  Recent Labs Lab 05/30/16 0609 06/03/16 1852  AST 19 20  ALT 24 31  ALKPHOS 54 48  BILITOT 0.9 2.9*  PROT 5.9* 6.1*  ALBUMIN 3.3* 3.2*   No results for input(s): LIPASE, AMYLASE in the last 168 hours. No results for input(s): AMMONIA in the last 168 hours. Coagulation Profile:  Recent Labs Lab 06/03/16 1852  INR 1.11   Cardiac Enzymes: No  results for input(s): CKTOTAL, CKMB, CKMBINDEX, TROPONINI in the last 168 hours. BNP (last 3 results) No results for input(s): PROBNP in the last 8760 hours. HbA1C: No results for input(s): HGBA1C in the last 72 hours. CBG:  Recent Labs Lab 06/04/16 1131 06/04/16 1607 06/04/16 2153 06/05/16 0830 06/05/16 1137  GLUCAP 90 127* 121* 95 108*   Lipid Profile: No results for input(s): CHOL, HDL, LDLCALC, TRIG, CHOLHDL, LDLDIRECT in the last 72 hours. Thyroid Function Tests: No results for input(s): TSH, T4TOTAL, FREET4, T3FREE, THYROIDAB in the last 72 hours. Anemia Panel: No results for input(s): VITAMINB12, FOLATE, FERRITIN, TIBC, IRON, RETICCTPCT in the last 72 hours. Urine analysis:    Component Value Date/Time   COLORURINE STRAW (A) 06/04/2016 2042   APPEARANCEUR CLEAR 06/04/2016 2042   LABSPEC 1.005 06/04/2016 2042   PHURINE 6.0 06/04/2016 2042   GLUCOSEU NEGATIVE 06/04/2016 2042   HGBUR NEGATIVE 06/04/2016 2042   BILIRUBINUR NEGATIVE 06/04/2016 2042   KETONESUR NEGATIVE 06/04/2016 2042   PROTEINUR NEGATIVE 06/04/2016 2042   NITRITE NEGATIVE  06/04/2016 2042   LEUKOCYTESUR NEGATIVE 06/04/2016 2042     Keilan Nichol M.D. [facility] 06/05/2016, 12:27 PM  Pager: [phone] Between 7am to 7pm - call Pager - [phone]  After 7pm go to [email or web address] - password [email or web address]  Call night coverage person covering after 7pm

## 2016-06-06 LAB — BASIC METABOLIC PANEL
Anion gap: 11 (ref 5–15)
BUN: 9 mg/dL (ref 6–20)
CHLORIDE: 103 mmol/L (ref 101–111)
CO2: 24 mmol/L (ref 22–32)
Calcium: 8.2 mg/dL — ABNORMAL LOW (ref 8.9–10.3)
Creatinine, Ser: 0.73 mg/dL (ref 0.61–1.24)
GFR calc non Af Amer: >60 mL/min (ref >60)
Glucose, Bld: 80 mg/dL (ref 65–99)
POTASSIUM: 3.1 mmol/L — AB (ref 3.5–5.1)
Sodium: 138 mmol/L (ref 135–145)

## 2016-06-06 LAB — GLUCOSE, CAPILLARY
GLUCOSE-CAPILLARY: 79 mg/dL (ref 65–99)
GLUCOSE-CAPILLARY: 121 mg/dL — AB (ref 65–99)
GLUCOSE-CAPILLARY: 107 mg/dL — AB (ref 65–99)
GLUCOSE-CAPILLARY: 104 mg/dL — AB (ref 65–99)

## 2016-06-06 LAB — URINE CULTURE: CULTURE: NO GROWTH

## 2016-06-06 MED ORDER — SODIUM CHLORIDE 0.9 % IV SOLN
30.0000 meq | Freq: Once | INTRAVENOUS | Status: AC
  Administered 2016-06-06: 30 meq via INTRAVENOUS
  Filled 2016-06-06: qty 15

## 2016-06-06 MED ORDER — CHOLESTYRAMINE LIGHT 4 G PO PACK
4.0000 g | PACK | Freq: Four times a day (QID) | ORAL | Status: AC
  Administered 2016-06-06 (×2): 4 g via ORAL
  Filled 2016-06-06 (×2): qty 1

## 2016-06-06 NOTE — Progress Notes (Signed)
[facility]                                                                              Patient Demographics  Mark Harrell, is a 56 y.o. male, DOB - 20-May-1961, ZOX:[id number]  Admit date - 06/03/2016   Admitting Physician Ivor Costa, MD  Outpatient Primary MD for the patient is [name], MD  Outpatient specialists:   LOS - 3  days    Chief Complaint  Patient presents with  . Near Syncope  . Weakness  . Emesis       Brief summary   Patient is a 56 year old male with hypertension, hyperlipidemia, COPD, GERD presented with nausea, vomiting, diarrhea, abdominal pain. Patient was recently hospitalized from 1/4-1/7 for COPD exacerbation and was discharged on Levaquin. Respiratory symptoms have resolved.   Assessment & Plan    Nausea vomiting and diarrhea and sepsis:  - Due to norovirus gastroenteritis - Patient met criteria for sepsis with leukocytosis, fever, tachycardia, tachypnea. Lactate is normal.  - C. difficile negative - GI pathogen panel positive for noro virus. Explained precautionary measures to the patient and for his family. - Continues to have intractable diarrhea, continue IV fluid hydration, symptomatic treatment, antiemetics - Wants to continue solid food. Will try cholestyramine 2 doses today  COPD with asthma: Recently wheezing, symptoms have improved significantly. Patient was discharged on Levaquin - Currently stable, no wheezing, continue nebulizers prn, dulera - Continue IV Solu-Medrol  HTN: -continue atenolol  BPH:  -pt is taking DDAVP, anastrozole, tadalafil, afluzosin  Depression: no suicidal or homicidal ideations. -Continue home medications: Zoloft   HLD: Last LDL was 58 on 08/13/15 -Continue Pravastatin 20  GERD: -will switch PPI to pepcid IV  DM-II: Last A1c 5.5 on 04/23/16, well controled. Patient is taking metformin at home -SSI  OSA: -CPAP  Code Status: Full CODE STATUS DVT Prophylaxis:   Lovenox  Family Communication: Discussed in detail with the patient, all imaging results, lab results explained to the patient   Disposition Plan:   Time Spent in minutes   15 minutes  Procedures:    Consultants:     Antimicrobials:   IV Zosyn 1/9> 1/10  Vancomycin 1/9 x1   Medications  Scheduled Meds: . alfuzosin  10 mg Oral Daily  . anastrozole  1 mg Oral Daily  . atenolol  100 mg Oral Daily  . calcium-vitamin D  1 tablet Oral BID  . cholestyramine light  4 g Oral Q6H  . darifenacin  7.5 mg Oral Daily  . desmopressin  200 mcg Oral QHS  . donepezil  23 mg Oral QHS  . enoxaparin (LOVENOX) injection  80 mg Subcutaneous Q24H  . famotidine (PEPCID) IV  20 mg Intravenous Q12H  . gabapentin  1,200 mg Oral TID  . guaiFENesin  600 mg Oral BID  . insulin aspart  0-5 Units Subcutaneous QHS  . insulin aspart  0-9 Units Subcutaneous TID WC  . loratadine  10 mg Oral Daily  . memantine  10 mg Oral BID  . methylPREDNISolone (SOLU-MEDROL) injection  60 mg Intravenous Daily  . mometasone-formoterol  2 puff Inhalation BID  . montelukast  10 mg Oral Daily  . multivitamin with minerals   Oral Daily  . pentosan polysulfate  200 mg Oral TID AC  . potassium chloride (KCL MULTIRUN) 30 mEq in 265 mL IVPB  30 mEq Intravenous Once  . pravastatin  40 mg Oral QHS  . sertraline  50 mg Oral BID  . sodium chloride flush  3 mL Intravenous Q12H  . tadalafil  5 mg Oral QPM  . triamcinolone cream   Topical BID   Continuous Infusions: . sodium chloride 125 mL/hr at 06/06/16 0650   PRN Meds:.acetaminophen **OR** acetaminophen, albuterol, benzonatate, cyclobenzaprine, HYDROcodone-homatropine, ondansetron (ZOFRAN) IV, oxyCODONE-acetaminophen **AND** oxyCODONE, zolpidem   Antibiotics   Anti-infectives    Start     Dose/Rate Route Frequency Ordered Stop   06/04/16 0830  piperacillin-tazobactam (ZOSYN) IVPB 3.375 g  Status:  Discontinued     3.375 g 12.5 mL/hr over 240 Minutes Intravenous Every  8 hours 06/04/16 0824 06/04/16 1420   06/03/16 2200  levofloxacin (LEVAQUIN) tablet 500 mg  Status:  Discontinued     500 mg Oral Every 24 hours 06/03/16 2121 06/04/16 0427   06/03/16 2200  metroNIDAZOLE (FLAGYL) IVPB 500 mg     500 mg 100 mL/hr over 60 Minutes Intravenous Once 06/03/16 2121 06/04/16 0239   06/03/16 1915  vancomycin (VANCOCIN) IVPB 1000 mg/200 mL premix     1,000 mg 200 mL/hr over 60 Minutes Intravenous  Once 06/03/16 1912 06/03/16 2142   06/03/16 1915  piperacillin-tazobactam (ZOSYN) IVPB 3.375 g     3.375 g 12.5 mL/hr over 240 Minutes Intravenous  Once 06/03/16 1912 06/03/16 2000        Subjective:   [name] was seen and examined today.Intractable diarrhea every hour. No nausea or vomiting. No abdominal pain. No fevers or chills.   Patient denies dizziness, chest pain, shortness of breath,  new weakness, numbess, tingling. No acute events overnight.    Objective:   Vitals:   06/05/16 1333 06/05/16 2116 06/06/16 0556 06/06/16 1000  BP: (!) 159/86 (!) 171/96 (!) 162/79   Pulse: 79 82 77   Resp: 18     Temp: 98.6 F (37 C) 98.6 F (37 C) 98.6 F (37 C)   TempSrc: Oral Oral Oral   SpO2: 99% 96% 99% 97%  Weight:   (!) 169.1 kg (372 lb 11.2 oz)   Height:        Intake/Output Summary (Last 24 hours) at 06/06/16 1111 Last data filed at 06/06/16 0650  Gross per 24 hour  Intake              355 ml  Output             1602 ml  Net            -1247 ml     Wt Readings from Last 3 Encounters:  06/06/16 (!) 169.1 kg (372 lb 11.2 oz)  06/01/16 (!) 172.8 kg (380 lb 14.2 oz)  05/27/16 (!) 174.6 kg (385 lb)     Exam  General: Alert and oriented x 3, NAD  HEENT:    Neck:   Cardiovascular: S1 S2 clear, RRR  Respiratory: CTAB  Gastrointestinal: Soft, Nontender, nondistended, + bowel sounds  Ext: no cyanosis clubbing or edema  Neuro: No new deficits  Skin: No rashes  Psych: Normal affect and demeanor, alert and oriented x3    Data Reviewed:   I have personally reviewed following labs and imaging studies  Micro Results Recent Results (from  the past 240 hour(s))  Respiratory Panel by PCR     Status: Abnormal   Collection Time: 05/29/16  1:13 PM  Result Value Ref Range Status   Adenovirus NOT DETECTED NOT DETECTED Final   Coronavirus 229E NOT DETECTED NOT DETECTED Final   Coronavirus HKU1 NOT DETECTED NOT DETECTED Final   Coronavirus NL63 NOT DETECTED NOT DETECTED Final   Coronavirus OC43 NOT DETECTED NOT DETECTED Final   Metapneumovirus NOT DETECTED NOT DETECTED Final   Rhinovirus / Enterovirus NOT DETECTED NOT DETECTED Final   Influenza A NOT DETECTED NOT DETECTED Final   Influenza B NOT DETECTED NOT DETECTED Final   Parainfluenza Virus 1 NOT DETECTED NOT DETECTED Final   Parainfluenza Virus 2 NOT DETECTED NOT DETECTED Final   Parainfluenza Virus 3 NOT DETECTED NOT DETECTED Final   Parainfluenza Virus 4 DETECTED (A) NOT DETECTED Final   Respiratory Syncytial Virus NOT DETECTED NOT DETECTED Final   Bordetella pertussis NOT DETECTED NOT DETECTED Final   Chlamydophila pneumoniae NOT DETECTED NOT DETECTED Final   Mycoplasma pneumoniae NOT DETECTED NOT DETECTED Final  Culture, blood (Routine x 2)     Status: None (Preliminary result)   Collection Time: 06/03/16  6:53 PM  Result Value Ref Range Status   Specimen Description BLOOD LEFT HAND  Final   Special Requests BOTTLES DRAWN AEROBIC AND ANAEROBIC 6CC  Final   Culture NO GROWTH 3 DAYS  Final   Report Status PENDING  Incomplete  Culture, blood (Routine x 2)     Status: None (Preliminary result)   Collection Time: 06/03/16  6:56 PM  Result Value Ref Range Status   Specimen Description BLOOD RIGHT HAND  Final   Special Requests BOTTLES DRAWN AEROBIC AND ANAEROBIC 6CC  Final   Culture NO GROWTH 3 DAYS  Final   Report Status PENDING  Incomplete  C difficile quick scan w PCR reflex     Status: None   Collection Time: 06/04/16 12:15 AM  Result Value Ref Range Status   C Diff  antigen NEGATIVE NEGATIVE Final   C Diff toxin NEGATIVE NEGATIVE Final   C Diff interpretation No C. difficile detected.  Final  Gastrointestinal Panel by PCR , Stool     Status: Abnormal   Collection Time: 06/04/16 12:15 AM  Result Value Ref Range Status   Campylobacter species NOT DETECTED NOT DETECTED Final   Plesimonas shigelloides NOT DETECTED NOT DETECTED Final   Salmonella species NOT DETECTED NOT DETECTED Final   Yersinia enterocolitica NOT DETECTED NOT DETECTED Final   Vibrio species NOT DETECTED NOT DETECTED Final   Vibrio cholerae NOT DETECTED NOT DETECTED Final   Enteroaggregative E coli (EAEC) NOT DETECTED NOT DETECTED Final   Enteropathogenic E coli (EPEC) NOT DETECTED NOT DETECTED Final   Enterotoxigenic E coli (ETEC) NOT DETECTED NOT DETECTED Final   Shiga like toxin producing E coli (STEC) NOT DETECTED NOT DETECTED Final   Shigella/Enteroinvasive E coli (EIEC) NOT DETECTED NOT DETECTED Final   Cryptosporidium NOT DETECTED NOT DETECTED Final   Cyclospora cayetanensis NOT DETECTED NOT DETECTED Final   Entamoeba histolytica NOT DETECTED NOT DETECTED Final   Giardia lamblia NOT DETECTED NOT DETECTED Final   Adenovirus F40/41 NOT DETECTED NOT DETECTED Final   Astrovirus NOT DETECTED NOT DETECTED Final   Norovirus GI/GII DETECTED (A) NOT DETECTED Final    Comment: CRITICAL RESULT CALLED TO, READ BACK BY AND VERIFIED WITH: TAMMY PENNINGTON 06/04/16 1350 SGD    Rotavirus A NOT DETECTED NOT DETECTED Final  Sapovirus (I, II, IV, and V) NOT DETECTED NOT DETECTED Final  Urine culture     Status: None   Collection Time: 06/04/16  8:42 PM  Result Value Ref Range Status   Specimen Description URINE, RANDOM  Final   Special Requests NONE  Final   Culture NO GROWTH  Final   Report Status 06/06/2016 FINAL  Final    Radiology Reports Dg Chest 2 View  Result Date: 06/03/2016 CLINICAL DATA:  56 y/o M; shortness of breath, diarrhea, and projectile vomiting. EXAM: CHEST  2 VIEW  COMPARISON:  05/29/2016 chest radiograph. FINDINGS: Stable mild cardiomegaly given projection and technique. Clear lungs. No pleural effusion. Bones are unremarkable. IMPRESSION: No acute pulmonary process.  Stable mild cardiomegaly. Electronically Signed   By: Kristine Garbe M.D.   On: 06/03/2016 20:48   Dg Chest 2 View  Result Date: 05/29/2016 CLINICAL DATA:  Cough for 1 week. EXAM: CHEST  2 VIEW COMPARISON:  05/25/2016 FINDINGS: The cardiac silhouette remains borderline enlarged. There is no evidence of acute airspace consolidation, edema, pleural effusion, or pneumothorax. No acute osseous abnormality is identified. IMPRESSION: No active cardiopulmonary disease. Electronically Signed   By: Logan Bores M.D.   On: 05/29/2016 06:55   Dg Chest 2 View  Result Date: 05/25/2016 CLINICAL DATA:  Cough, shortness of breath. EXAM: CHEST  2 VIEW COMPARISON:  Radiographs of February 13, 2016. FINDINGS: Stable cardiomediastinal silhouette. No pneumothorax or pleural effusion is noted. Both lungs are clear. The visualized skeletal structures are unremarkable. IMPRESSION: No active cardiopulmonary disease. Electronically Signed   By: Marijo Conception, M.D.   On: 05/25/2016 13:17    Lab Data:  CBC:  Recent Labs Lab 06/03/16 1852 06/04/16 0025  WBC 10.7* 9.9  NEUTROABS 8.2*  --   HGB 17.9* 17.0  HCT 54.0* 52.4*  MCV 92.9 93.9  PLT 292 778   Basic Metabolic Panel:  Recent Labs Lab 06/03/16 1852 06/04/16 0025 06/05/16 0440 06/06/16 0532  NA 138 136 138 138  K 3.7 3.9 3.5 3.1*  CL 105 103 105 103  CO2 23 21* 26 24  GLUCOSE 119* 95 96 80  BUN _0 CREATININE 1.11 1.03 0.81 0.73  CALCIUM 8.4* 8.0* 7.8* 8.2*   GFR: Estimated Creatinine Clearance: 168.5 mL/min (by C-G formula based on SCr of 0.73 mg/dL). Liver Function Tests:  Recent Labs Lab 06/03/16 1852  AST 20  ALT 31  ALKPHOS 48  BILITOT 2.9*  PROT 6.1*  ALBUMIN 3.2*   No results for input(s): LIPASE, AMYLASE  in the last 168 hours. No results for input(s): AMMONIA in the last 168 hours. Coagulation Profile:  Recent Labs Lab 06/03/16 1852  INR 1.11   Cardiac Enzymes: No results for input(s): CKTOTAL, CKMB, CKMBINDEX, TROPONINI in the last 168 hours. BNP (last 3 results) No results for input(s): PROBNP in the last 8760 hours. HbA1C: No results for input(s): HGBA1C in the last 72 hours. CBG:  Recent Labs Lab 06/05/16 0830 06/05/16 1137 06/05/16 1731 06/05/16 2114 06/06/16 0750  GLUCAP 95 108* 127* 101* 79   Lipid Profile: No results for input(s): CHOL, HDL, LDLCALC, TRIG, CHOLHDL, LDLDIRECT in the last 72 hours. Thyroid Function Tests: No results for input(s): TSH, T4TOTAL, FREET4, T3FREE, THYROIDAB in the last 72 hours. Anemia Panel: No results for input(s): VITAMINB12, FOLATE, FERRITIN, TIBC, IRON, RETICCTPCT in the last 72 hours. Urine analysis:    Component Value Date/Time   COLORURINE STRAW (A) 06/04/2016 2042   APPEARANCEUR  CLEAR 06/04/2016 2042   LABSPEC 1.005 06/04/2016 2042   PHURINE 6.0 06/04/2016 2042   GLUCOSEU NEGATIVE 06/04/2016 2042   HGBUR NEGATIVE 06/04/2016 2042   BILIRUBINUR NEGATIVE 06/04/2016 2042   KETONESUR NEGATIVE 06/04/2016 2042   PROTEINUR NEGATIVE 06/04/2016 2042   NITRITE NEGATIVE 06/04/2016 2042   LEUKOCYTESUR NEGATIVE 06/04/2016 2042     RAI,RIPUDEEP M.D. [facility] 06/06/2016, 11:11 AM  Pager: [phone] Between 7am to 7pm - call Pager - [phone]  After 7pm go to [email or web address] - password [email or web address]  Call night coverage person covering after 7pm

## 2016-06-06 NOTE — Progress Notes (Signed)
Patient arrived from [facility] accompanied by the staff.  Alert and oriented x4, IV in the left forearm NS 139ml/hr, denies any pain currently, skin warm dry and intact. Call bell and phone within reach, patient oriented to room.  Will continue to monitor.

## 2016-06-07 LAB — BASIC METABOLIC PANEL
ANION GAP: 6 (ref 5–15)
BUN: 10 mg/dL (ref 6–20)
CO2: 25 mmol/L (ref 22–32)
Calcium: 8.2 mg/dL — ABNORMAL LOW (ref 8.9–10.3)
Chloride: 104 mmol/L (ref 101–111)
Creatinine, Ser: 0.76 mg/dL (ref 0.61–1.24)
GFR calc Af Amer: >60 mL/min (ref >60)
GLUCOSE: 75 mg/dL (ref 65–99)
POTASSIUM: 4.4 mmol/L (ref 3.5–5.1)
Sodium: 135 mmol/L (ref 135–145)

## 2016-06-07 LAB — GLUCOSE, CAPILLARY
GLUCOSE-CAPILLARY: 115 mg/dL — AB (ref 65–99)
Glucose-Capillary: 73 mg/dL (ref 65–99)
Glucose-Capillary: 147 mg/dL — ABNORMAL HIGH (ref 65–99)
Glucose-Capillary: 99 mg/dL (ref 65–99)

## 2016-06-07 MED ORDER — PREDNISONE 20 MG PO TABS
40.0000 mg | ORAL_TABLET | Freq: Every day | ORAL | Status: DC
  Administered 2016-06-08 – 2016-06-09 (×2): 40 mg via ORAL
  Filled 2016-06-07 (×2): qty 2

## 2016-06-07 MED ORDER — PANTOPRAZOLE SODIUM 20 MG PO TBEC
20.0000 mg | DELAYED_RELEASE_TABLET | Freq: Every day | ORAL | Status: DC
  Administered 2016-06-07 – 2016-06-08 (×2): 20 mg via ORAL
  Filled 2016-06-07 (×2): qty 1

## 2016-06-07 MED ORDER — WHITE PETROLATUM GEL
Status: AC
  Administered 2016-06-07: 23:00:00
  Filled 2016-06-07: qty 1

## 2016-06-07 MED ORDER — METHYLPREDNISOLONE SODIUM SUCC 125 MG IJ SOLR
INTRAMUSCULAR | Status: AC
  Administered 2016-06-07: 60 mg via INTRAVENOUS
  Filled 2016-06-07: qty 2

## 2016-06-07 NOTE — Evaluation (Signed)
Physical Therapy Evaluation Patient Details Name: Mark Harrell MRN: [id number] DOB: 1960-12-07 Today's Date: 06/07/2016   History of Present Illness  Patient is a 56 yo male admitted 06/03/16 with N/V, diarrhea.  Patient with sepsis, norovirus.  Patient with recent hospitalization for COPD exacerbation.    PMH:  HTN, HLD, COPD, depression, OSA on CPAP, morbid obesity, DM  Clinical Impression  Patient presents with problems listed below.  Will benefit from acute PT to maximize functional independence and activity tolerance.  Recommend patient continue OP PT at d//c.    Follow Up Recommendations Outpatient PT (Continue OP PT at d/c)    Equipment Recommendations  None recommended by PT    Recommendations for Other Services       Precautions / Restrictions Precautions Precautions: None Restrictions Weight Bearing Restrictions: No      Mobility  Bed Mobility               General bed mobility comments: Patient in chair  Transfers Overall transfer level: Modified independent Equipment used: None             General transfer comment: Increased time with transfers.  Steady in static stance.  Ambulation/Gait Ambulation/Gait assistance: Supervision Ambulation Distance (Feet): 40 Feet Assistive device: None Gait Pattern/deviations: Step-through pattern;Decreased stride length;Decreased weight shift to left;Antalgic;Wide base of support Gait velocity: decreased Gait velocity interpretation: Below normal speed for age/gender General Gait Details: Patient with slightly antalgic gait due to Lt knee pain.  Stairs            Wheelchair Mobility    Modified Rankin (Stroke Patients Only)       Balance Overall balance assessment: No apparent balance deficits (not formally assessed)                                           Pertinent Vitals/Pain Pain Assessment: Faces Faces Pain Scale: Hurts a little bit Pain Location: Lt knee and Lt heel (cut  on Lt heel) Pain Descriptors / Indicators: Sore Pain Intervention(s): Monitored during session;Repositioned (Asked RN to apply padded dressing to heel)    Home Living Family/patient expects to be discharged to:: Private residence Living Arrangements: Parent (Patient moved in with parents with his recent illnesses) Available Help at Discharge: Family;Available 24 hours/day Type of Home: House Home Access: Stairs to enter Entrance Stairs-Rails: Right;Left;Can reach both Entrance Stairs-Number of Steps: 2 Home Layout: One level Home Equipment: Walker - 2 wheels;Cane - single point;Bedside commode;Shower seat      Prior Function Level of Independence: Independent with assistive device(s)         Comments: Uses cane to assist with Lt knee pain     Hand Dominance   Dominant Hand: Right    Extremity/Trunk Assessment   Upper Extremity Assessment Upper Extremity Assessment: Overall WFL for tasks assessed    Lower Extremity Assessment Lower Extremity Assessment: Generalized weakness    Cervical / Trunk Assessment Cervical / Trunk Assessment: Normal  Communication   Communication: No difficulties  Cognition Arousal/Alertness: Awake/alert Behavior During Therapy: WFL for tasks assessed/performed Overall Cognitive Status: Within Functional Limits for tasks assessed                      General Comments      Exercises     Assessment/Plan    PT Assessment Patient needs continued PT services  PT  Problem List Decreased activity tolerance;Decreased mobility;Obesity;Decreased skin integrity;Pain          PT Treatment Interventions Gait training;Balance training;Functional mobility training;Patient/family education    PT Goals (Current goals can be found in the Care Plan section)  Acute Rehab PT Goals Patient Stated Goal: To get well and be at home PT Goal Formulation: With patient Time For Goal Achievement: 06/10/16 Potential to Achieve Goals: Good     Frequency Other (Comment) (1 session)   Barriers to discharge        Co-evaluation               End of Session   Activity Tolerance: Patient tolerated treatment well;Treatment limited secondary to medical complications (Comment) (Limited distance due to continued diarrhea) Patient left: in chair;with call bell/phone within reach Nurse Communication: Mobility status         Time: [id number] PT Time Calculation (min) (ACUTE ONLY): 15 min   Charges:   PT Evaluation $PT Eval Low Complexity: 1 Procedure     PT G Codes:        Despina Pole 06-Jul-2016, 5:06 PM Carita Pian. Sanjuana Kava, [place] Pager [phone]

## 2016-06-07 NOTE — Progress Notes (Signed)
PROGRESS NOTE    Mark Harrell  OAC:[id number] DOB: 05-21-1961 DOA: 06/03/2016 PCP: [name], MD     Brief Narrative:  Mark Harrell is a 56 y.o. male with medical history significant of hypertension, hyperlipidemia, COPD, asthma, GERD, depression, prostatitis, BPH, OSA on CPAP, IBS, morbid obesity, who presents with nausea, vomiting, diarrhea and abdominal pain. Patient was recently hospitalized from 1/4-1/7 for COPD exacerbation and was discharged on Levaquin. Respiratory symptoms have resolved. GI panel positive for norovirus.    Assessment & Plan:   Principal Problem:   Nausea vomiting and diarrhea Active Problems:   Hypertension   Hyperlipidemia   BPH (benign prostatic hyperplasia)   Depression, controlled   COPD with asthma (HCC)   GERD (gastroesophageal reflux disease)   DM (diabetes mellitus) ([place])   Sepsis ([place])   Fever  Sepsis secondary to norovirus gastroenteritis  - Patient met criteria for sepsis with leukocytosis, fever, tachycardia, tachypnea. Lactate is normal. Sepsis pathology now resolved with treatment  - C. difficile negative. GI pathogen panel positive for norovirus. Continue enteric precautions   - Will stop IVF today and continue to monitor PO intake, he continues to complain of loose stools every 2 hours, which is slightly improved from every 1 hour.   COPD with asthma: Recently admitted for exacerbation and discharged on Levaquin - Currently stable, no wheezing, continue nebulizers prn, dulera - Stop IV steroids, taper to PO today   HTN -Continue atenolol  BPH:  -Continue DDAVP, anastrozole, tadalafil, afluzosin  Depression: no suicidal or homicidal ideations. -Continue home medications: Zoloft   WFU:XNAT LDL was 58 on 08/13/15 -Continue Pravastatin 20  GERD: -Continue PPI, switch to PO today   DM-II:Last A1c 5.5 on 04/23/16, well controled. Patient is takingmetforminat home -SSI  OSA: -CPAP   DVT prophylaxis:  lovenox Code Status: Full Family Communication: no family at bedside Disposition Plan: pending further improvement, suspect return back home, will ask PT eval    Consultants:   None  Procedures:   None  Antimicrobials:  Anti-infectives    Start     Dose/Rate Route Frequency Ordered Stop   06/04/16 0830  piperacillin-tazobactam (ZOSYN) IVPB 3.375 g  Status:  Discontinued     3.375 g 12.5 mL/hr over 240 Minutes Intravenous Every 8 hours 06/04/16 0824 06/04/16 1420   06/03/16 2200  levofloxacin (LEVAQUIN) tablet 500 mg  Status:  Discontinued     500 mg Oral Every 24 hours 06/03/16 2121 06/04/16 0427   06/03/16 2200  metroNIDAZOLE (FLAGYL) IVPB 500 mg     500 mg 100 mL/hr over 60 Minutes Intravenous Once 06/03/16 2121 06/04/16 0239   06/03/16 1915  vancomycin (VANCOCIN) IVPB 1000 mg/200 mL premix     1,000 mg 200 mL/hr over 60 Minutes Intravenous  Once 06/03/16 1912 06/03/16 2142   06/03/16 1915  piperacillin-tazobactam (ZOSYN) IVPB 3.375 g     3.375 g 12.5 mL/hr over 240 Minutes Intravenous  Once 06/03/16 1912 06/03/16 2000       Subjective: No new complaints today. Continues to have diarrhea, every 2 hours, which is slightly better since admission. Has some abdominal tenderness, which is also improving. No further vomiting episodes and was able to keep down some food yesterday. We'll continue to encourage diet and stop IV fluids today.  Objective: Vitals:   06/06/16 1843 06/06/16 2055 06/07/16 0544 06/07/16 0837  BP: (!) 157/80 (!) 174/92 107/69 137/89  Pulse: 75 78 72 76  Resp: '20 20 20 20  '$ Temp: 99 F (  37.2 C) 99.2 F (37.3 C) 98.4 F (36.9 C) 98.4 F (36.9 C)  TempSrc: Oral Oral Oral Oral  SpO2: 93% 95% 96% 95%  Weight:  (!) 169.1 kg (372 lb 11.1 oz)    Height:        Intake/Output Summary (Last 24 hours) at 06/07/16 1052 Last data filed at 06/07/16 0601  Gross per 24 hour  Intake          3787.92 ml  Output                2 ml  Net          3785.92 ml    Filed Weights   06/05/16 0455 06/06/16 0556 06/06/16 2055  Weight: (!) 167.7 kg (369 lb 12.8 oz) (!) 169.1 kg (372 lb 11.2 oz) (!) 169.1 kg (372 lb 11.1 oz)    Examination:  General exam: Appears calm and comfortable  Respiratory system: Clear to auscultation. Respiratory effort normal. Cardiovascular system: S1 & S2 heard, RRR. No JVD, murmurs, rubs, gallops or clicks. No pedal edema. Gastrointestinal system: Abdomen is nondistended, soft and slightly TTP lower quadrants. No organomegaly or masses felt. Normal bowel sounds heard. Central nervous system: Alert and oriented. No focal neurological deficits. Extremities: Symmetric 5 x 5 power. Skin: No rashes, lesions or ulcers Psychiatry: Judgement and insight appear normal. Mood & affect appropriate.   Data Reviewed: I have personally reviewed following labs and imaging studies  CBC:  Recent Labs Lab 06/03/16 1852 06/04/16 0025  WBC 10.7* 9.9  NEUTROABS 8.2*  --   HGB 17.9* 17.0  HCT 54.0* 52.4*  MCV 92.9 93.9  PLT 292 094   Basic Metabolic Panel:  Recent Labs Lab 06/03/16 1852 06/04/16 0025 06/05/16 0440 06/06/16 0532 06/07/16 0640  NA 138 136 138 138 135  K 3.7 3.9 3.5 3.1* 4.4  CL 105 103 105 103 104  CO2 23 21* '26 24 25  '$ GLUCOSE 119* 95 96 80 75  BUN '20 17 8 9 10  '$ CREATININE 1.11 1.03 0.81 0.73 0.76  CALCIUM 8.4* 8.0* 7.8* 8.2* 8.2*   GFR: Estimated Creatinine Clearance: 168.5 mL/min (by C-G formula based on SCr of 0.76 mg/dL). Liver Function Tests:  Recent Labs Lab 06/03/16 1852  AST 20  ALT 31  ALKPHOS 48  BILITOT 2.9*  PROT 6.1*  ALBUMIN 3.2*   No results for input(s): LIPASE, AMYLASE in the last 168 hours. No results for input(s): AMMONIA in the last 168 hours. Coagulation Profile:  Recent Labs Lab 06/03/16 1852  INR 1.11   Cardiac Enzymes: No results for input(s): CKTOTAL, CKMB, CKMBINDEX, TROPONINI in the last 168 hours. BNP (last 3 results) No results for input(s): PROBNP in the  last 8760 hours. HbA1C: No results for input(s): HGBA1C in the last 72 hours. CBG:  Recent Labs Lab 06/06/16 0750 06/06/16 1135 06/06/16 1726 06/06/16 2050 06/07/16 0816  GLUCAP 79 121* 107* 104* 73   Lipid Profile: No results for input(s): CHOL, HDL, LDLCALC, TRIG, CHOLHDL, LDLDIRECT in the last 72 hours. Thyroid Function Tests: No results for input(s): TSH, T4TOTAL, FREET4, T3FREE, THYROIDAB in the last 72 hours. Anemia Panel: No results for input(s): VITAMINB12, FOLATE, FERRITIN, TIBC, IRON, RETICCTPCT in the last 72 hours. Sepsis Labs:  Recent Labs Lab 06/03/16 1859 06/03/16 2002 06/04/16 0011  PROCALCITON  --   --  1.41  LATICACIDVEN 1.67 1.36 1.2    Recent Results (from the past 240 hour(s))  Respiratory Panel by PCR  Status: Abnormal   Collection Time: 05/29/16  1:13 PM  Result Value Ref Range Status   Adenovirus NOT DETECTED NOT DETECTED Final   Coronavirus 229E NOT DETECTED NOT DETECTED Final   Coronavirus HKU1 NOT DETECTED NOT DETECTED Final   Coronavirus NL63 NOT DETECTED NOT DETECTED Final   Coronavirus OC43 NOT DETECTED NOT DETECTED Final   Metapneumovirus NOT DETECTED NOT DETECTED Final   Rhinovirus / Enterovirus NOT DETECTED NOT DETECTED Final   Influenza A NOT DETECTED NOT DETECTED Final   Influenza B NOT DETECTED NOT DETECTED Final   Parainfluenza Virus 1 NOT DETECTED NOT DETECTED Final   Parainfluenza Virus 2 NOT DETECTED NOT DETECTED Final   Parainfluenza Virus 3 NOT DETECTED NOT DETECTED Final   Parainfluenza Virus 4 DETECTED (A) NOT DETECTED Final   Respiratory Syncytial Virus NOT DETECTED NOT DETECTED Final   Bordetella pertussis NOT DETECTED NOT DETECTED Final   Chlamydophila pneumoniae NOT DETECTED NOT DETECTED Final   Mycoplasma pneumoniae NOT DETECTED NOT DETECTED Final  Culture, blood (Routine x 2)     Status: None (Preliminary result)   Collection Time: 06/03/16  6:53 PM  Result Value Ref Range Status   Specimen Description BLOOD  LEFT HAND  Final   Special Requests BOTTLES DRAWN AEROBIC AND ANAEROBIC 6CC  Final   Culture NO GROWTH 4 DAYS  Final   Report Status PENDING  Incomplete  Culture, blood (Routine x 2)     Status: None (Preliminary result)   Collection Time: 06/03/16  6:56 PM  Result Value Ref Range Status   Specimen Description BLOOD RIGHT HAND  Final   Special Requests BOTTLES DRAWN AEROBIC AND ANAEROBIC 6CC  Final   Culture NO GROWTH 4 DAYS  Final   Report Status PENDING  Incomplete  C difficile quick scan w PCR reflex     Status: None   Collection Time: 06/04/16 12:15 AM  Result Value Ref Range Status   C Diff antigen NEGATIVE NEGATIVE Final   C Diff toxin NEGATIVE NEGATIVE Final   C Diff interpretation No C. difficile detected.  Final  Gastrointestinal Panel by PCR , Stool     Status: Abnormal   Collection Time: 06/04/16 12:15 AM  Result Value Ref Range Status   Campylobacter species NOT DETECTED NOT DETECTED Final   Plesimonas shigelloides NOT DETECTED NOT DETECTED Final   Salmonella species NOT DETECTED NOT DETECTED Final   Yersinia enterocolitica NOT DETECTED NOT DETECTED Final   Vibrio species NOT DETECTED NOT DETECTED Final   Vibrio cholerae NOT DETECTED NOT DETECTED Final   Enteroaggregative E coli (EAEC) NOT DETECTED NOT DETECTED Final   Enteropathogenic E coli (EPEC) NOT DETECTED NOT DETECTED Final   Enterotoxigenic E coli (ETEC) NOT DETECTED NOT DETECTED Final   Shiga like toxin producing E coli (STEC) NOT DETECTED NOT DETECTED Final   Shigella/Enteroinvasive E coli (EIEC) NOT DETECTED NOT DETECTED Final   Cryptosporidium NOT DETECTED NOT DETECTED Final   Cyclospora cayetanensis NOT DETECTED NOT DETECTED Final   Entamoeba histolytica NOT DETECTED NOT DETECTED Final   Giardia lamblia NOT DETECTED NOT DETECTED Final   Adenovirus F40/41 NOT DETECTED NOT DETECTED Final   Astrovirus NOT DETECTED NOT DETECTED Final   Norovirus GI/GII DETECTED (A) NOT DETECTED Final    Comment: CRITICAL  RESULT CALLED TO, READ BACK BY AND VERIFIED WITH: TAMMY PENNINGTON 06/04/16 1350 SGD    Rotavirus A NOT DETECTED NOT DETECTED Final   Sapovirus (I, II, IV, and V) NOT DETECTED NOT DETECTED Final  Urine culture     Status: None   Collection Time: 06/04/16  8:42 PM  Result Value Ref Range Status   Specimen Description URINE, RANDOM  Final   Special Requests NONE  Final   Culture NO GROWTH  Final   Report Status 06/06/2016 FINAL  Final       Radiology Studies: No results found.    Scheduled Meds: . alfuzosin  10 mg Oral Daily  . anastrozole  1 mg Oral Daily  . atenolol  100 mg Oral Daily  . calcium-vitamin D  1 tablet Oral BID  . darifenacin  7.5 mg Oral Daily  . desmopressin  200 mcg Oral QHS  . donepezil  23 mg Oral QHS  . enoxaparin (LOVENOX) injection  80 mg Subcutaneous Q24H  . gabapentin  1,200 mg Oral TID  . guaiFENesin  600 mg Oral BID  . insulin aspart  0-5 Units Subcutaneous QHS  . insulin aspart  0-9 Units Subcutaneous TID WC  . loratadine  10 mg Oral Daily  . memantine  10 mg Oral BID  . mometasone-formoterol  2 puff Inhalation BID  . montelukast  10 mg Oral Daily  . multivitamin with minerals   Oral Daily  . pantoprazole  20 mg Oral Daily  . pentosan polysulfate  200 mg Oral TID AC  . pravastatin  40 mg Oral QHS  . [START ON 06/08/2016] predniSONE  40 mg Oral Q breakfast  . sertraline  50 mg Oral BID  . sodium chloride flush  3 mL Intravenous Q12H  . tadalafil  5 mg Oral QPM  . triamcinolone cream   Topical BID   Continuous Infusions:    LOS: 4 days    Time spent: 40 minutes   Dessa Phi, DO [facility] [email or web address] Password [facility] 06/07/2016, 10:52 AM

## 2016-06-08 DIAGNOSIS — L249 Irritant contact dermatitis, unspecified cause: Secondary | ICD-10-CM | POA: Diagnosis present

## 2016-06-08 DIAGNOSIS — R112 Nausea with vomiting, unspecified: Secondary | ICD-10-CM

## 2016-06-08 DIAGNOSIS — R197 Diarrhea, unspecified: Secondary | ICD-10-CM

## 2016-06-08 LAB — CULTURE, BLOOD (ROUTINE X 2)
CULTURE: NO GROWTH
Culture: NO GROWTH

## 2016-06-08 LAB — BASIC METABOLIC PANEL
ANION GAP: 9 (ref 5–15)
BUN: 12 mg/dL (ref 6–20)
CO2: 28 mmol/L (ref 22–32)
Calcium: 8.6 mg/dL — ABNORMAL LOW (ref 8.9–10.3)
Chloride: 101 mmol/L (ref 101–111)
Creatinine, Ser: 0.75 mg/dL (ref 0.61–1.24)
GFR calc Af Amer: >60 mL/min (ref >60)
GFR calc non Af Amer: >60 mL/min (ref >60)
GLUCOSE: 78 mg/dL (ref 65–99)
Potassium: 3.2 mmol/L — ABNORMAL LOW (ref 3.5–5.1)
Sodium: 138 mmol/L (ref 135–145)

## 2016-06-08 LAB — GLUCOSE, CAPILLARY
Glucose-Capillary: 86 mg/dL (ref 65–99)
Glucose-Capillary: 132 mg/dL — ABNORMAL HIGH (ref 65–99)
Glucose-Capillary: 115 mg/dL — ABNORMAL HIGH (ref 65–99)

## 2016-06-08 MED ORDER — TRIAMCINOLONE ACETONIDE 0.025 % EX CREA
TOPICAL_CREAM | Freq: Two times a day (BID) | CUTANEOUS | Status: DC
  Administered 2016-06-08 – 2016-06-09 (×3): via TOPICAL
  Filled 2016-06-08: qty 15

## 2016-06-08 MED ORDER — CETAPHIL MOISTURIZING EX LOTN
TOPICAL_LOTION | Freq: Two times a day (BID) | CUTANEOUS | Status: DC
  Administered 2016-06-08: 1 via TOPICAL
  Administered 2016-06-08 – 2016-06-09 (×2): via TOPICAL
  Filled 2016-06-08: qty 473

## 2016-06-08 NOTE — Progress Notes (Signed)
[facility] Progress Note  Patient: Mark Harrell OTR:[id number]   PCP: [name], MD DOB: 10-Apr-1961   DOA: 06/03/2016   DOS: 06/08/2016   Date of Service: the patient was seen and examined on 06/08/2016  Brief hospital course: Mark Harrell a 56 y.o.malewith medical history significant of hypertension, hyperlipidemia, COPD, asthma, GERD, depression, prostatitis, BPH, OSA on CPAP, IBS, morbid obesity, who presents with nausea, vomiting, diarrhea and abdominal pain. Patient was recently hospitalized from 1/4-1/7 for COPD exacerbation and was discharged on Levaquin. Respiratory symptoms have resolved. GI panel positive for norovirus.  Currently further plan is To monitor for improvement in diarrhea.  Assessment and Plan: Sepsis secondary to norovirus gastroenteritis  - Patient met criteria for sepsis with leukocytosis, fever, tachycardia, tachypnea. Lactate is normal. Sepsis pathology now resolved with treatment  - C. difficile negative. GI pathogen panel positive for norovirus. Continue enteric precautions  Monitor for improvement in diarrhea  COPD with asthma: Recently admitted for exacerbation and discharged on Levaquin - Currently stable, no wheezing, continue nebulizers prn, dulera - Stop IV steroids, taper to PO  Irritant Contact dermatitis due to frequent handwashing. We will initiate low-dose triamcinolone cream as well as barrier cream.  HTN -Continue atenolol  BPH:  -Continue DDAVP, anastrozole, tadalafil, afluzosin  Depression: no suicidal or homicidal ideations. -Continue home medications: Zoloft   YBF:XOVA LDL was 58 on 08/13/15 -Continue Pravastatin 20  GERD: -Continue PPI, switch to PO  DM-II:Last A1c 5.5 on 04/23/16, well controled. Patient is takingmetforminat home -SSI  OSA: -CPAP  Bowel regimen: last BM 06/08/2016 Diet: Carb modified diet DVT Prophylaxis: subcutaneous Heparin  Advance goals of care discussion: Full  code Family Communication: family was present at bedside, at the time of interview. The pt provided permission to discuss medical plan with the family. Opportunity was given to ask question and all questions were answered satisfactorily.   Disposition:  Discharge to home. Expected discharge date: 06/09/2016  Consultants: none Procedures: none  Antibiotics: Anti-infectives    Start     Dose/Rate Route Frequency Ordered Stop   06/04/16 0830  piperacillin-tazobactam (ZOSYN) IVPB 3.375 g  Status:  Discontinued     3.375 g 12.5 mL/hr over 240 Minutes Intravenous Every 8 hours 06/04/16 0824 06/04/16 1420   06/03/16 2200  levofloxacin (LEVAQUIN) tablet 500 mg  Status:  Discontinued     500 mg Oral Every 24 hours 06/03/16 2121 06/04/16 0427   06/03/16 2200  metroNIDAZOLE (FLAGYL) IVPB 500 mg     500 mg 100 mL/hr over 60 Minutes Intravenous Once 06/03/16 2121 06/04/16 0239   06/03/16 1915  vancomycin (VANCOCIN) IVPB 1000 mg/200 mL premix     1,000 mg 200 mL/hr over 60 Minutes Intravenous  Once 06/03/16 1912 06/03/16 2142   06/03/16 1915  piperacillin-tazobactam (ZOSYN) IVPB 3.375 g     3.375 g 12.5 mL/hr over 240 Minutes Intravenous  Once 06/03/16 1912 06/03/16 2000        Subjective: Feeling better, now having some formed stool although soft still  Objective: Physical Exam: Vitals:   06/07/16 2129 06/08/16 0549 06/08/16 0924 06/08/16 1705  BP: 130/65 (!) 158/75 (!) 160/80 137/79  Pulse: 74 67 72 70  Resp: '20 19 20 20  '$ Temp: 98.7 F (37.1 C) 98.5 F (36.9 C) 98.2 F (36.8 C) 98.4 F (36.9 C)  TempSrc: Oral Oral Oral Oral  SpO2: 96% 97% 97% 98%  Weight: (!) 170.5 kg (375 lb 14.2 oz)     Height:  Intake/Output Summary (Last 24 hours) at 06/08/16 1717 Last data filed at 06/08/16 1331  Gross per 24 hour  Intake              600 ml  Output                9 ml  Net              591 ml   Filed Weights   06/06/16 0556 06/06/16 2055 06/07/16 2129  Weight: (!) 169.1 kg  (372 lb 11.2 oz) (!) 169.1 kg (372 lb 11.1 oz) (!) 170.5 kg (375 lb 14.2 oz)    General: Alert, Awake and Oriented to Time, Place and Person. Appear in mild distress, affect appropriate Eyes: PERRL, Conjunctiva normal ENT: Oral Mucosa clear moist. Neck: no JVD, no Abnormal Mass Or lumps Cardiovascular: S1 and S2 Present, no Murmur, Respiratory: Bilateral Air entry equal and Decreased, no use of accessory muscle, Clear to Auscultation, no Crackles, no wheezes Abdomen: Bowel Sound present, Soft and no tenderness Skin: bilateral hand dorsum redness, no Rash, no induration Extremities: no Pedal edema, no calf tenderness Neurologic: Grossly no focal neuro deficit. Bilaterally Equal motor strength  Data Reviewed: CBC:  Recent Labs Lab 06/03/16 1852 06/04/16 0025  WBC 10.7* 9.9  NEUTROABS 8.2*  --   HGB 17.9* 17.0  HCT 54.0* 52.4*  MCV 92.9 93.9  PLT 292 546   Basic Metabolic Panel:  Recent Labs Lab 06/04/16 0025 06/05/16 0440 06/06/16 0532 06/07/16 0640 06/08/16 0723  NA 136 138 138 135 138  K 3.9 3.5 3.1* 4.4 3.2*  CL 103 105 103 104 101  CO2 21* '26 24 25 28  '$ GLUCOSE 95 96 80 75 78  BUN '17 8 9 10 12  '$ CREATININE 1.03 0.81 0.73 0.76 0.75  CALCIUM 8.0* 7.8* 8.2* 8.2* 8.6*    Liver Function Tests:  Recent Labs Lab 06/03/16 1852  AST 20  ALT 31  ALKPHOS 48  BILITOT 2.9*  PROT 6.1*  ALBUMIN 3.2*   No results for input(s): LIPASE, AMYLASE in the last 168 hours. No results for input(s): AMMONIA in the last 168 hours. Coagulation Profile:  Recent Labs Lab 06/03/16 1852  INR 1.11   Cardiac Enzymes: No results for input(s): CKTOTAL, CKMB, CKMBINDEX, TROPONINI in the last 168 hours. BNP (last 3 results) No results for input(s): PROBNP in the last 8760 hours.  CBG:  Recent Labs Lab 06/07/16 1318 06/07/16 1637 06/07/16 2124 06/08/16 0748 06/08/16 1702  GLUCAP 115* 147* 99 86 132*    Studies: No results found.   Scheduled Meds: . alfuzosin  10 mg  Oral Daily  . anastrozole  1 mg Oral Daily  . atenolol  100 mg Oral Daily  . calcium-vitamin D  1 tablet Oral BID  . cetaphil   Topical BID  . darifenacin  7.5 mg Oral Daily  . desmopressin  200 mcg Oral QHS  . donepezil  23 mg Oral QHS  . enoxaparin (LOVENOX) injection  80 mg Subcutaneous Q24H  . gabapentin  1,200 mg Oral TID  . guaiFENesin  600 mg Oral BID  . insulin aspart  0-5 Units Subcutaneous QHS  . insulin aspart  0-9 Units Subcutaneous TID WC  . loratadine  10 mg Oral Daily  . memantine  10 mg Oral BID  . mometasone-formoterol  2 puff Inhalation BID  . montelukast  10 mg Oral Daily  . multivitamin with minerals   Oral Daily  . pantoprazole  20 mg Oral Daily  . pentosan polysulfate  200 mg Oral TID AC  . pravastatin  40 mg Oral QHS  . predniSONE  40 mg Oral Q breakfast  . sertraline  50 mg Oral BID  . sodium chloride flush  3 mL Intravenous Q12H  . tadalafil  5 mg Oral QPM  . triamcinolone   Topical BID   Continuous Infusions: PRN Meds: acetaminophen **OR** acetaminophen, albuterol, benzonatate, cyclobenzaprine, HYDROcodone-homatropine, ondansetron (ZOFRAN) IV, oxyCODONE-acetaminophen **AND** oxyCODONE, zolpidem  Time spent: 30 minutes  Author: Berle Mull, MD [facility] Pager: [phone] 06/08/2016 5:17 PM  If 7PM-7AM, please contact night-coverage at [email or web address], password [facility]

## 2016-06-08 NOTE — Progress Notes (Signed)
Physical Therapy Treatment Patient Details Name: Mark Harrell MRN: [id number] DOB: June 21, 1960 Today's Date: Jul 02, 2016    History of Present Illness Patient is a 56 yo male admitted 06/03/16 with N/V, diarrhea.  Patient with sepsis, norovirus.  Patient with recent hospitalization for COPD exacerbation.    PMH:  HTN, HLD, COPD, depression, OSA on CPAP, morbid obesity, DM    PT Comments    Patient able to ambulate 200' with no assistive device with no loss of balance.  Patient scored 21/21 on modified DGI balance assessment.  Goals met.  No further acute PT needed - PT will sign off.  Follow Up Recommendations  Outpatient PT (Continue OP PT at d/c)     Equipment Recommendations  None recommended by PT    Recommendations for Other Services       Precautions / Restrictions Precautions Precautions: None Restrictions Weight Bearing Restrictions: No    Mobility  Bed Mobility               General bed mobility comments: Patient in chair  Transfers Overall transfer level: Modified independent Equipment used: None                Ambulation/Gait Ambulation/Gait assistance: Modified independent (Device/Increase time) Ambulation Distance (Feet): 200 Feet Assistive device: None Gait Pattern/deviations: Step-through pattern   Gait velocity interpretation: at or above normal speed for age/gender General Gait Details: Good gait pattern, balance, and speed today.   Stairs            Wheelchair Mobility    Modified Rankin (Stroke Patients Only)       Balance                                    Cognition Arousal/Alertness: Awake/alert Behavior During Therapy: WFL for tasks assessed/performed Overall Cognitive Status: Within Functional Limits for tasks assessed                      Exercises      General Comments General comments (skin integrity, edema, etc.): Rash on hands      Pertinent Vitals/Pain Pain Assessment: 0-10 Pain  Score: 5  Pain Location: back and headache Pain Descriptors / Indicators: Aching;Headache Pain Intervention(s): Monitored during session;Patient requesting pain meds-RN notified    Home Living                      Prior Function            PT Goals (current goals can now be found in the care plan section) Acute Rehab PT Goals Patient Stated Goal: To get well and be at home Progress towards PT goals: Goals met/education completed, patient discharged from PT    Frequency           PT Plan Current plan remains appropriate    Co-evaluation             End of Session   Activity Tolerance: Patient tolerated treatment well Patient left: in chair;with call bell/phone within reach;with nursing/sitter in room     Time: 1745-1801 PT Time Calculation (min) (ACUTE ONLY): 16 min  Charges:  $Gait Training: 8-22 mins                    G Codes:      Despina Pole 2016-07-02, 8:22 PM Carita Pian. Rosana Hoes, PT, MBA Acute Rehab  Services Pager [phone]

## 2016-06-09 ENCOUNTER — Other Ambulatory Visit: Payer: Self-pay | Admitting: Internal Medicine

## 2016-06-09 LAB — BASIC METABOLIC PANEL
Anion gap: 10 (ref 5–15)
BUN: 12 mg/dL (ref 6–20)
CO2: 28 mmol/L (ref 22–32)
CREATININE: 0.84 mg/dL (ref 0.61–1.24)
Calcium: 8.8 mg/dL — ABNORMAL LOW (ref 8.9–10.3)
Chloride: 99 mmol/L — ABNORMAL LOW (ref 101–111)
GFR calc non Af Amer: >60 mL/min (ref >60)
Glucose, Bld: 81 mg/dL (ref 65–99)
Potassium: 3.5 mmol/L (ref 3.5–5.1)
Sodium: 137 mmol/L (ref 135–145)

## 2016-06-09 LAB — CBC
HCT: 47.9 % (ref 39.0–52.0)
HEMOGLOBIN: 15.7 g/dL (ref 13.0–17.0)
MCH: 30.1 pg (ref 26.0–34.0)
MCHC: 32.8 g/dL (ref 30.0–36.0)
MCV: 91.8 fL (ref 78.0–100.0)
Platelets: 294 10*3/uL (ref 150–400)
RBC: 5.22 MIL/uL (ref 4.22–5.81)
RDW: 13.8 % (ref 11.5–15.5)
WBC: 16 10*3/uL — AB (ref 4.0–10.5)

## 2016-06-09 LAB — GLUCOSE, CAPILLARY
GLUCOSE-CAPILLARY: 109 mg/dL — AB (ref 65–99)
Glucose-Capillary: 82 mg/dL (ref 65–99)
Glucose-Capillary: 141 mg/dL — ABNORMAL HIGH (ref 65–99)

## 2016-06-09 LAB — MAGNESIUM: Magnesium: 2.2 mg/dL (ref 1.7–2.4)

## 2016-06-09 MED ORDER — CETAPHIL MOISTURIZING EX LOTN: TOPICAL_LOTION | Freq: Two times a day (BID) | CUTANEOUS | 0 refills | Status: DC

## 2016-06-09 MED ORDER — FAMOTIDINE 20 MG PO TABS
20.0000 mg | ORAL_TABLET | Freq: Every day | ORAL | Status: DC
  Administered 2016-06-09: 20 mg via ORAL
  Filled 2016-06-09: qty 1

## 2016-06-09 MED ORDER — PREDNISONE 10 MG PO TABS: ORAL_TABLET | ORAL | 0 refills | Status: DC

## 2016-06-09 MED ORDER — FAMOTIDINE 20 MG PO TABS: 20.0000 mg | ORAL_TABLET | Freq: Every day | ORAL | 0 refills | Status: DC

## 2016-06-09 NOTE — Progress Notes (Signed)
Patient discharged to home, IV was discontinued, AVS reviewed, prescription provided, patient confirmed he had all belongings, follow appointments scheduled, patient left floor v/s stable with no complaints, escorted downstairs via wheelchair with volunteer.

## 2016-06-09 NOTE — Discharge Instructions (Signed)
Norovirus Infection A norovirus infection is caused by exposure to a virus in a group of similar viruses (noroviruses). This type of infection causes inflammation in your stomach and intestines (gastroenteritis). Norovirus is the most common cause of gastroenteritis. It also causes food poisoning. Anyone can get a norovirus infection. It spreads very easily (contagious). You can get it from contaminated food, water, surfaces, or other people. Norovirus is found in the stool or vomit of infected people. You can spread the infection as soon as you feel sick until 2 weeks after you recover.  Symptoms usually begin within 2 days after you become infected. Most norovirus symptoms affect the digestive system. CAUSES Norovirus infection is caused by contact with norovirus. You can catch norovirus if you:  Eat or drink something contaminated with norovirus.  Touch surfaces or objects contaminated with norovirus and then put your hand in your mouth.  Have direct contact with an infected person who has symptoms.  Share food, drink, or utensils with someone with who is sick with norovirus. SIGNS AND SYMPTOMS Symptoms of norovirus may include:  Nausea.  Vomiting.  Diarrhea.  Stomach cramps.  Fever.  Chills.  Headache.  Muscle aches.  Tiredness. DIAGNOSIS Your health care provider may suspect norovirus based on your symptoms and physical exam. Your health care provider may also test a sample of your stool or vomit for the virus.  TREATMENT There is no specific treatment for norovirus. Most people get better without treatment in about 2 days. HOME CARE INSTRUCTIONS  Replace lost fluids by drinking plenty of water or rehydration fluids containing important minerals called electrolytes. This prevents dehydration. Drink enough fluid to keep your urine clear or pale yellow.  Do not prepare food for others while you are infected. Wait at least 3 days after recovering from the illness to do  that. PREVENTION   Wash your hands often, especially after using the toilet or changing a diaper.  Wash fruits and vegetables thoroughly before preparing or serving them.  Throw out any food that a sick person may have touched.  Disinfect contaminated surfaces immediately after someone in the household has been sick. Use a bleach-based household cleaner.  Immediately remove and wash soiled clothes or sheets. SEEK MEDICAL CARE IF:  Your vomiting, diarrhea, and stomach pain is getting worse.  Your symptoms of norovirus do not go away after 2-3 days. SEEK IMMEDIATE MEDICAL CARE IF:  You develop symptoms of dehydration that do not improve with fluid replacement. This may include:  Excessive sleepiness.  Lack of tears.  Dry mouth.  Dizziness when standing.  Weak pulse. This information is not intended to replace advice given to you by your health care provider. Make sure you discuss any questions you have with your health care provider. Document Released: 08/02/2002 Document Revised: 06/02/2014 Document Reviewed: 10/20/2013 Elsevier Interactive Patient Education  2017 [employer].

## 2016-06-09 NOTE — Care Management Note (Signed)
Case Management Note  Patient Details  Name: Mark Harrell MRN: [id number] Date of Birth: [birth year]  Subjective/Objective:      CM following for progression and d/c planning.              Action/Plan: 06/09/2016 Noted consult for outpt PT /OT however specifice needs not  Identified and pt ambulating without assist for 200 ft. This was discussed with Dr Marlowe Sax and we are unable to justify the need for outpatient PT/OT therefore he will cancel the order and encourage the pt to ambulate at home with family to monitor. No DME needs.   Expected Discharge Date:  06/09/16               Expected Discharge Plan:  Home/Self Care  In-House Referral:  NA  Discharge planning Services  CM Consult  Post Acute Care Choice:  NA Choice offered to:  NA  DME Arranged:  N/A DME Agency:  NA  HH Arranged:  NA HH Agency:  NA  Status of Service:     If discussed at [place] of Stay Meetings, dates discussed:    Additional Comments:  Adron Bene, RN 06/09/2016, 1:12 PM

## 2016-06-12 ENCOUNTER — Inpatient Hospital Stay (INDEPENDENT_AMBULATORY_CARE_PROVIDER_SITE_OTHER): Payer: BLUE CROSS/BLUE SHIELD | Admitting: Orthopaedic Surgery

## 2016-06-17 ENCOUNTER — Telehealth: Payer: Self-pay | Admitting: Physician Assistant

## 2016-06-17 DIAGNOSIS — E291 Testicular hypofunction: Secondary | ICD-10-CM | POA: Diagnosis not present

## 2016-06-17 NOTE — Discharge Summary (Signed)
[facility] Discharge Summary   Patient: Mark Harrell [id number]   PCP: [name], MD DOB: 1960/12/28   Date of admission: 06/03/2016   Date of discharge: 06/09/2016    Discharge Diagnoses:  Principal Problem:   Nausea vomiting and diarrhea Active Problems:   Hypertension   Hyperlipidemia   BPH (benign prostatic hyperplasia)   Depression, controlled   COPD with asthma ([place])   GERD (gastroesophageal reflux disease)   DM (diabetes mellitus) ([place])   Sepsis ([place])   Fever   Irritant contact dermatitis due to frequent handwashing   Admitted From: home Disposition:  Outpatient therapy  Recommendations for Outpatient Follow-up:  1. Follow-up with PCP   Follow-up Information    MCKEOWN,WILLIAM DAVID, MD. Schedule an appointment as soon as possible for a visit in 2 week(s).   Specialty:  Internal Medicine Contact information: [address] [place] [address] [phone]          Diet recommendation: Cardiac and carb modified diet  Activity: The patient is advised to gradually reintroduce usual activities.  Discharge Condition: good  Code Status: Full code  History of present illness: As per the H and P dictated on admission, "Mark Harrell is a 56 y.o. male with medical history significant of hypertension, hyperlipidemia, COPD, asthma, GERD, depression, prostatitis, BPH, OSA on CPAP, IBS, morbid obesity, who presents with nausea, vomiting, diarrhea and abdominal pain.  Patient was recently hospitalized from 01/04-1/722 COPD/asthma exacerbation. Patient was discharged on Levaquin. Pt states that his respiratory symptoms have improved significantly though he still has some mild cough and mild SOB.  Pt states that he he has nausea, vomiting, diarrhea since yesterday. He vomited 6 times today. He had more than 10 times of diarrhea. He has mild intermittent abdominal pain, which is located in the central abdomen. He has fever and chills.  Patient does not have symptoms of UTI, chest pain, unilateral weakness."  Hospital Course:   Summary of his active problems in the hospital is as following. Sepsis secondary to norovirus gastroenteritis  - Patient met criteria for sepsis with leukocytosis, fever, tachycardia, tachypnea. Lactate is normal. Sepsis pathology now resolved with treatment  - C. difficile negative. GI pathogen panel positive for norovirus. Continue enteric precautions  Diarrhea improved significantly on the day of the discharge. PPI discontinued at the time of the discharge.  COPD with asthma: Recently admitted for exacerbation anddischarged on Levaquin - Currently stable, no wheezing, continue nebulizers prn, dulera - Prednisone taper provided to the patient.  Irritant Contact dermatitis due to frequent handwashing. We will continue low-dose triamcinolone cream as well as barrier cream.  HTN -Continue atenolol  BPH:  -Continue DDAVP, anastrozole, tadalafil, afluzosin  Depression: no suicidal or homicidal ideations. -Continue home medications: Zoloft   BDZ:HGDJ LDL was 58 on 08/13/15 -Continue Pravastatin 20  GERD: -Continue PPI, switch to PO  DM-II:Last A1c 5.5 on 04/23/16, well controled. Patient is takingmetforminat home -SSI  OSA: -CPAP  All other chronic medical condition were stable during the hospitalization.  Patient was seen by physical therapy, who recommended outpatient physical therapy, which was arranged by social worker and case Freight forwarder. On the day of the discharge the patient's vitals were stable and diarrhea resolved, and no other acute medical condition were reported by patient. the patient was felt safe to be discharge at home with family.  Procedures and Results:  none   Consultations:  none  DISCHARGE MEDICATION: Discharge Medication List as of 06/09/2016  1:15 PM  START taking these medications   Details  ondansetron (ZOFRAN ODT) 4 MG disintegrating  tablet Take 1 tablet (4 mg total) by mouth every 6 (six) hours as needed for nausea or vomiting., Starting Thu 06/05/2016, Print      CONTINUE these medications which have CHANGED   Details  cetaphil (CETAPHIL) lotion Apply topically 2 (two) times daily., Starting Mon 06/09/2016, Normal    famotidine (PEPCID) 20 MG tablet Take 1 tablet (20 mg total) by mouth daily., Starting Mon 06/09/2016, Normal    predniSONE (DELTASONE) 10 MG tablet Take 6m daily for 3days,Take 364mdaily for 3days,Take 2073maily for 3days,Take 58m49mily for 3days, then stop, Normal      CONTINUE these medications which have NOT CHANGED   Details  albuterol (PROAIR HFA) 108 (90 Base) MCG/ACT inhaler INHALE 2 PUFFS INTO THE LUNGS EVERY 4 (FOUR) HOURS AS NEEDED FOR WHEEZING OR SHORTNESS OF BREATH., Normal    alfuzosin (UROXATRAL) 10 MG 24 hr tablet Take 10 mg by mouth daily.  , Historical Med    anastrozole (ARIMIDEX) 1 MG tablet Take 1 mg by mouth 2 (two) times daily. , Starting Wed 08/08/2015, Historical Med    atenolol (TENORMIN) 100 MG tablet TAKE 1 TABLET BY MOUTH EVERY DAY, Normal    baclofen (LIORESAL) 10 MG tablet Take 10 mg by mouth daily as needed for muscle spasms. , Starting Mon 03/03/2016, Historical Med    benzonatate (TESSALON) 200 MG capsule TAKE ONE CAPSULE 3 TIMES A DAY AS NEEDED FOR COUGH, Normal    brompheniramine-pseudoephedrine-DM 30-2-10 MG/5ML syrup TAKE 1 TEASPOONFUL 3 TIMES A DAY AS NEEDED, Normal    budesonide-formoterol (SYMBICORT) 160-4.5 MCG/ACT inhaler Inhale 2 puffs into the lungs 2 (two) times daily., Starting Mon 04/14/2016, Normal    Calcium Carbonate-Vitamin D (CALCIUM + D PO) Take 500 mg by mouth 2 (two) times daily. , Historical Med    cetirizine (ZYRTEC) 10 MG tablet Take 10 mg by mouth daily., Historical Med    Cholecalciferol (VITAMIN D PO) Take 10,000-20,000 Units by mouth 2 (two) times daily. 20000 in the morning and 10000 in the evening, Historical Med    Cinnamon 500 MG  capsule Take 500 mg by mouth daily., Historical Med    cyclobenzaprine (FLEXERIL) 10 MG tablet Take 10 mg by mouth 3 (three) times daily as needed for muscle spasms. , Starting Thu 07/28/2013, Historical Med    desmopressin (DDAVP) 0.2 MG tablet Take 200 mcg by mouth at bedtime., Starting Mon 04/10/2014, Historical Med    donepezil (ARICEPT) 23 MG TABS tablet Take 23 mg by mouth at bedtime., Historical Med    DYMISTA 137-50 MCG/ACT SUSP PLACE 2 SPRAYS INTO BOTH NOSTRILS 2 (TWO) TIMES DAILY., Historical Med    gabapentin (NEURONTIN) 600 MG tablet 1,200 mg 3 (three) times daily. , Starting Thu 07/28/2013, Historical Med    guaiFENesin (MUCINEX) 600 MG 12 hr tablet Take 600 mg by mouth 2 (two) times daily. , Historical Med    HYDROcodone-homatropine (HYCODAN) 5-1.5 MG/5ML syrup Take 5 mLs by mouth every 6 (six) hours as needed for cough., Starting Tue 05/27/2016, Print    L-METHYLFOLATE CALCIUM PO Take 1 tablet by mouth daily. , Starting Thu 11/24/2013, Historical Med    memantine (NAMENDA) 10 MG tablet Take 10 mg by mouth 2 (two) times daily. , Historical Med    metFORMIN (GLUCOPHAGE XR) 500 MG 24 hr tablet Take one tablet twice daily with breakfast and dinner., Normal    Methylfol-Algae-B12-Acetylcyst (CEREFOLIN NAC) 6-90.314-2-600  MG TABS Take 1 tablet by mouth daily. , Starting Tue 08/03/2012, Historical Med    montelukast (SINGULAIR) 10 MG tablet Take 1 tablet (10 mg total) by mouth daily., Starting Mon 08/13/2015, Normal    Multiple Vitamins-Minerals (MULTIVITAMIN WITH MINERALS) tablet Take 1 tablet by mouth daily., Historical Med    oxyCODONE-acetaminophen (PERCOCET) 10-325 MG tablet Take 1 tablet by mouth every 4 (four) hours as needed for pain., Historical Med    pentosan polysulfate (ELMIRON) 100 MG capsule Take 200 mg by mouth 3 (three) times daily before meals. , Historical Med    Phosphatidylserine-DHA-EPA (VAYACOG) 100-19.5-6.5 MG CAPS Take 1 capsule by mouth 2 (two) times daily. ,  Starting Thu 07/06/2014, Historical Med    pravastatin (PRAVACHOL) 40 MG tablet TAKE 1 TABLET BY MOUTH AT BEDTIME, Normal    sertraline (ZOLOFT) 50 MG tablet Take 50 mg by mouth 2 (two) times daily. , Historical Med    tadalafil (CIALIS) 5 MG tablet Take 5 mg by mouth every evening. , Historical Med    triamcinolone cream (KENALOG) 0.1 % APPLY TOPICALLY 4 TIMES A DAY, Normal    trospium (SANCTURA) 20 MG tablet 20 mg 2 (two) times daily. , Starting Fri 06/22/2015, Historical Med    zinc gluconate 50 MG tablet Take 50 mg by mouth daily., Historical Med      STOP taking these medications     levofloxacin (LEVAQUIN) 500 MG tablet      esomeprazole (NEXIUM) 20 MG capsule        Allergies  Allergen Reactions  . Bee Venom Swelling  . Cymbalta [Duloxetine Hcl] Other (See Comments)    unknown  . Fenofibrate Other (See Comments)    Back pain  . Ppd [Tuberculin Purified Protein Derivative] Other (See Comments)    +ppd NEG Quantferron Gold 3/13  . Verapamil Other (See Comments)    Back pain   Discharge Instructions    Diet - low sodium heart healthy    Complete by:  As directed    Discharge instructions    Complete by:  As directed    It is important that you read following instructions as well as go over your medication list with RN to help you understand your care after this hospitalization.  Discharge Instructions: Please follow-up with PCP in one week  Please request your primary care physician to go over all Hospital Tests and Procedure/Radiological results at the follow up,  Please get all Hospital records sent to your PCP by signing hospital release before you go home.   Do not take more than prescribed Pain, Sleep and Anxiety Medications. You were cared for by a hospitalist during your hospital stay. If you have any questions about your discharge medications or the care you received while you were in the hospital after you are discharged, you can call the unit and ask to  speak with the hospitalist on call if the hospitalist that took care of you is not available.  Once you are discharged, your primary care physician will handle any further medical issues. Please note that NO REFILLS for any discharge medications will be authorized once you are discharged, as it is imperative that you return to your primary care physician (or establish a relationship with a primary care physician if you do not have one) for your aftercare needs so that they can reassess your need for medications and monitor your lab values. You Must read complete instructions/literature along with all the possible adverse reactions/side effects for all  the Medicines you take and that have been prescribed to you. Take any new Medicines after you have completely understood and accept all the possible adverse reactions/side effects. Wear Seat belts while driving. If you have smoked or chewed Tobacco in the last 2 yrs please stop smoking and/or stop any Recreational drug use.   Increase activity slowly    Complete by:  As directed      Discharge Exam: Filed Weights   06/06/16 0556 06/06/16 2055 06/07/16 2129  Weight: (!) 169.1 kg (372 lb 11.2 oz) (!) 169.1 kg (372 lb 11.1 oz) (!) 170.5 kg (375 lb 14.2 oz)   Vitals:   06/09/16 0736 06/09/16 0923  BP:  (!) 145/75  Pulse: 75 77  Resp: 19 16  Temp:  98.4 F (36.9 C)   General: Appear in no distress, no Rash; Oral Mucosa moist Cardiovascular: S1 and S2 Present, no Murmur, no JVD Respiratory: Bilateral Air entry present and Clear to Auscultation, no Crackles, no wheezes Abdomen: Bowel Sound present, Soft and no tenderness Extremities: no Pedal edema, no calf tenderness Neurology: Grossly no focal neuro deficit.  The results of significant diagnostics from this hospitalization (including imaging, microbiology, ancillary and laboratory) are listed below for reference.    Significant Diagnostic Studies: Dg Chest 2 View  Result Date:  06/03/2016 CLINICAL DATA:  56 y/o M; shortness of breath, diarrhea, and projectile vomiting. EXAM: CHEST  2 VIEW COMPARISON:  05/29/2016 chest radiograph. FINDINGS: Stable mild cardiomegaly given projection and technique. Clear lungs. No pleural effusion. Bones are unremarkable. IMPRESSION: No acute pulmonary process.  Stable mild cardiomegaly. Electronically Signed   By: Kristine Garbe M.D.   On: 06/03/2016 20:48   Dg Chest 2 View  Result Date: 05/29/2016 CLINICAL DATA:  Cough for 1 week. EXAM: CHEST  2 VIEW COMPARISON:  05/25/2016 FINDINGS: The cardiac silhouette remains borderline enlarged. There is no evidence of acute airspace consolidation, edema, pleural effusion, or pneumothorax. No acute osseous abnormality is identified. IMPRESSION: No active cardiopulmonary disease. Electronically Signed   By: Logan Bores M.D.   On: 05/29/2016 06:55   Dg Chest 2 View  Result Date: 05/25/2016 CLINICAL DATA:  Cough, shortness of breath. EXAM: CHEST  2 VIEW COMPARISON:  Radiographs of February 13, 2016. FINDINGS: Stable cardiomediastinal silhouette. No pneumothorax or pleural effusion is noted. Both lungs are clear. The visualized skeletal structures are unremarkable. IMPRESSION: No active cardiopulmonary disease. Electronically Signed   By: Marijo Conception, M.D.   On: 05/25/2016 13:17   Time spent: 30 minutes  Signed:  Berle Mull  [facility] 06/09/2016 , 11:53 AM

## 2016-06-17 NOTE — Telephone Encounter (Signed)
Patient recently discharged from the hospital 06/06/2016 with sepsis, UTI, dehydration, and COPD exacerbation. Walked into office with chills, fever, extreme weakness. Currently we did not have any visits until tomorrow, patient was advised to go to ER, may need IV fluids and can get labs today. Patient is leaving to go to the ER.

## 2016-06-18 ENCOUNTER — Ambulatory Visit (INDEPENDENT_AMBULATORY_CARE_PROVIDER_SITE_OTHER): Payer: BLUE CROSS/BLUE SHIELD | Admitting: Physician Assistant

## 2016-06-18 ENCOUNTER — Encounter: Payer: Self-pay | Admitting: Physician Assistant

## 2016-06-18 VITALS — BP 110/70 | HR 95 | Temp 98.6°F | Resp 18 | Ht 73.0 in | Wt 371.6 lb

## 2016-06-18 DIAGNOSIS — A09 Infectious gastroenteritis and colitis, unspecified: Secondary | ICD-10-CM | POA: Diagnosis not present

## 2016-06-18 DIAGNOSIS — R197 Diarrhea, unspecified: Secondary | ICD-10-CM

## 2016-06-18 DIAGNOSIS — Z79899 Other long term (current) drug therapy: Secondary | ICD-10-CM | POA: Diagnosis not present

## 2016-06-18 DIAGNOSIS — R350 Frequency of micturition: Secondary | ICD-10-CM | POA: Diagnosis not present

## 2016-06-18 DIAGNOSIS — J441 Chronic obstructive pulmonary disease with (acute) exacerbation: Secondary | ICD-10-CM

## 2016-06-18 DIAGNOSIS — E039 Hypothyroidism, unspecified: Secondary | ICD-10-CM

## 2016-06-18 LAB — TSH: TSH: 0.48 m[IU]/L (ref 0.40–4.50)

## 2016-06-18 LAB — CBC WITH DIFFERENTIAL/PLATELET
BASOS ABS: 0 {cells}/uL (ref 0–200)
Basophils Relative: 0 %
EOS PCT: 1 %
Eosinophils Absolute: 153 cells/uL (ref 15–500)
HCT: 49.5 % (ref 38.5–50.0)
Hemoglobin: 16.2 g/dL (ref 13.2–17.1)
LYMPHS PCT: 11 %
Lymphs Abs: 1683 cells/uL (ref 850–3900)
MCH: 30.2 pg (ref 27.0–33.0)
MCHC: 32.7 g/dL (ref 32.0–36.0)
MCV: 92.2 fL (ref 80.0–100.0)
MONOS PCT: 8 %
MPV: 10.1 fL (ref 7.5–12.5)
Monocytes Absolute: 1224 cells/uL — ABNORMAL HIGH (ref 200–950)
NEUTROS PCT: 80 %
Neutro Abs: 12240 cells/uL — ABNORMAL HIGH (ref 1500–7800)
PLATELETS: 284 10*3/uL (ref 140–400)
RBC: 5.37 MIL/uL (ref 4.20–5.80)
RDW: 14.1 % (ref 11.0–15.0)
WBC: 15.3 10*3/uL — ABNORMAL HIGH (ref 3.8–10.8)

## 2016-06-18 MED ORDER — HYDROCODONE-HOMATROPINE 5-1.5 MG/5ML PO SYRP: 5.0000 mL | ORAL_SOLUTION | Freq: Four times a day (QID) | ORAL | 0 refills | Status: DC | PRN

## 2016-06-18 MED ORDER — ONDANSETRON 4 MG PO TBDP: 4.0000 mg | ORAL_TABLET | Freq: Four times a day (QID) | ORAL | 0 refills | Status: DC | PRN

## 2016-06-18 NOTE — Progress Notes (Signed)
Hospital follow up  Assessment and Plan:  COPD exacerbation ([place]) -     HYDROcodone-homatropine (HYCODAN) 5-1.5 MG/5ML syrup; Take 5 mLs by mouth every 6 (six) hours as needed for cough.  Diarrhea of presumed infectious origin -     ondansetron (ZOFRAN ODT) 4 MG disintegrating tablet; Take 1 tablet (4 mg total) by mouth every 6 (six) hours as needed for nausea or vomiting.  Medication management -     CBC with Differential/Platelet -     BASIC METABOLIC PANEL WITH GFR -     Hepatic function panel -     Magnesium  Hypothyroidism, unspecified type -     TSH  Urinary frequency -     Urinalysis, Routine w reflex microscopic -     Urine culture   Hospital discharge meds were reviewed, and reconciled with the patient.   Medications Discontinued During This Encounter  Medication Reason  . predniSONE (DELTASONE) 10 MG tablet   . brompheniramine-pseudoephedrine-DM 30-2-10 MG/5ML syrup   . metFORMIN (GLUCOPHAGE XR) 500 MG 24 hr tablet   . HYDROcodone-homatropine (HYCODAN) 5-1.5 MG/5ML syrup Reorder  . ondansetron (ZOFRAN ODT) 4 MG disintegrating tablet Reorder    Over 40 minutes of exam, counseling, chart review, and complex, high/moderate level critical decision making was performed this visit.     HPI 56 y.o.male presents for follow up for transition from recent hospitalization or SNIF stay. Admit date to the hospital was 06/03/16, patient was discharged from the hospital on 06/09/16 and our clinical staff contacted the office the day after discharge to set up a follow up appointment, patient was admitted for: COPD exacerbation, N/V and diarrhea, and sepsis secondary to norovirus, negative Cdiff, on the fomotadine.  He had some dehydration and hypokalemia.   Since discharge he has continues to have soft stools, nausea, and GERD. He has two more doses of prednisone, he states he started to have worsening weakness chills, was sent to Er for possible dehydration yesterday but did not go  due to wait. Drank about 6 gatorade yesterday that helped with weakness, has been on OTC potassium. Rash bilateral thighs.   Images while in the hospital: Dg Chest 2 View  Result Date: 06/03/2016 CLINICAL DATA:  56 y/o M; shortness of breath, diarrhea, and projectile vomiting. EXAM: CHEST  2 VIEW COMPARISON:  05/29/2016 chest radiograph. FINDINGS: Stable mild cardiomegaly given projection and technique. Clear lungs. No pleural effusion. Bones are unremarkable. IMPRESSION: No acute pulmonary process.  Stable mild cardiomegaly. Electronically Signed   By: Kristine Garbe M.D.   On: 06/03/2016 20:48    Past Medical History:  Diagnosis Date  . Anesthesia complication requiring reversal agent administration    ? from central apnea, very difficult to get off vent  . Arthritis    osteo  . Asthma   . BPH (benign prostatic hyperplasia)   . Complication of anesthesia    difficulty waking , they twlight me because of my respiratory problems "  . Depression    on zoloft for depression  . GERD (gastroesophageal reflux disease)   . Headache    botox injections for headaches  . Hyperlipidemia   . Hypertension   . Hypogonadism male   . IBS (irritable bowel syndrome)   . Obesity   . OSA (obstructive sleep apnea)   . Pre-diabetes   . Prostatitis      Allergies  Allergen Reactions  . Bee Venom Swelling  . Cymbalta [Duloxetine Hcl] Other (See Comments)    unknown  .  Fenofibrate Other (See Comments)    Back pain  . Ppd [Tuberculin Purified Protein Derivative] Other (See Comments)    +ppd NEG Quantferron Gold 3/13  . Verapamil Other (See Comments)    Back pain      Current Outpatient Prescriptions on File Prior to Visit  Medication Sig Dispense Refill  . albuterol (PROAIR HFA) 108 (90 Base) MCG/ACT inhaler INHALE 2 PUFFS INTO THE LUNGS EVERY 4 (FOUR) HOURS AS NEEDED FOR WHEEZING OR SHORTNESS OF BREATH. 8.5 each 3  . alfuzosin (UROXATRAL) 10 MG 24 hr tablet Take 10 mg by mouth  daily.      [name] anastrozole (ARIMIDEX) 1 MG tablet Take 1 mg by mouth 2 (two) times daily.   3  . atenolol (TENORMIN) 100 MG tablet TAKE 1 TABLET BY MOUTH EVERY DAY 90 tablet 3  . baclofen (LIORESAL) 10 MG tablet Take 10 mg by mouth daily as needed for muscle spasms.     . benzonatate (TESSALON) 200 MG capsule TAKE ONE CAPSULE 3 TIMES A DAY AS NEEDED FOR COUGH 90 capsule 0  . budesonide-formoterol (SYMBICORT) 160-4.5 MCG/ACT inhaler Inhale 2 puffs into the lungs 2 (two) times daily. 1 Inhaler 5  . Calcium Carbonate-Vitamin D (CALCIUM + D PO) Take 500 mg by mouth 2 (two) times daily.     . cetaphil (CETAPHIL) lotion Apply topically 2 (two) times daily. 236 mL 0  . cetirizine (ZYRTEC) 10 MG tablet Take 10 mg by mouth daily.    . Cholecalciferol (VITAMIN D PO) Take 10,000-20,000 Units by mouth 2 (two) times daily. 20000 in the morning and 10000 in the evening    . Cinnamon 500 MG capsule Take 500 mg by mouth daily.    . cyclobenzaprine (FLEXERIL) 10 MG tablet Take 10 mg by mouth 3 (three) times daily as needed for muscle spasms.     [name] desmopressin (DDAVP) 0.2 MG tablet Take 200 mcg by mouth at bedtime.  3  . donepezil (ARICEPT) 23 MG TABS tablet Take 23 mg by mouth at bedtime.    . DYMISTA 137-50 MCG/ACT SUSP PLACE 2 SPRAYS INTO BOTH NOSTRILS 2 (TWO) TIMES DAILY.  5  . famotidine (PEPCID) 20 MG tablet Take 1 tablet (20 mg total) by mouth daily. 30 tablet 0  . gabapentin (NEURONTIN) 600 MG tablet 1,200 mg 3 (three) times daily.     [name] guaiFENesin (MUCINEX) 600 MG 12 hr tablet Take 600 mg by mouth 2 (two) times daily.     . L-METHYLFOLATE CALCIUM PO Take 1 tablet by mouth daily.     . memantine (NAMENDA) 10 MG tablet Take 10 mg by mouth 2 (two) times daily.     . Methylfol-Algae-B12-Acetylcyst (CEREFOLIN NAC) 6-90.314-2-600 MG TABS Take 1 tablet by mouth daily.     . montelukast (SINGULAIR) 10 MG tablet Take 1 tablet (10 mg total) by mouth daily. 90 tablet 3  . Multiple Vitamins-Minerals (MULTIVITAMIN  WITH MINERALS) tablet Take 1 tablet by mouth daily.    [name] oxyCODONE-acetaminophen (PERCOCET) 10-325 MG tablet Take 1 tablet by mouth every 4 (four) hours as needed for pain.    . pentosan polysulfate (ELMIRON) 100 MG capsule Take 200 mg by mouth 3 (three) times daily before meals.     . Phosphatidylserine-DHA-EPA (VAYACOG) 100-19.5-6.5 MG CAPS Take 1 capsule by mouth 2 (two) times daily.     . pravastatin (PRAVACHOL) 40 MG tablet TAKE 1 TABLET BY MOUTH AT BEDTIME 90 tablet 2  . sertraline (ZOLOFT) 50 MG tablet Take  50 mg by mouth 2 (two) times daily.     . tadalafil (CIALIS) 5 MG tablet Take 5 mg by mouth every evening.     . triamcinolone cream (KENALOG) 0.1 % APPLY TOPICALLY 4 TIMES A DAY 30 g 1  . trospium (SANCTURA) 20 MG tablet 20 mg 2 (two) times daily.   2  . zinc gluconate 50 MG tablet Take 50 mg by mouth daily.     No current facility-administered medications on file prior to visit.     ROS: all negative except above.   Physical Exam: Filed Weights   06/18/16 1546  Weight: (!) 371 lb 9.6 oz (168.6 kg)   BP 110/70   Pulse 95   Temp 98.6 F (37 C)   Resp 18   Ht 6\' 1"  (1.854 m)   Wt (!) 371 lb 9.6 oz (168.6 kg)   SpO2 99%   BMI 49.03 kg/m  General Appearance: Well nourished, in no apparent distress. Eyes: PERRLA, EOMs, conjunctiva no swelling or erythema Sinuses: No Frontal/maxillary tenderness ENT/Mouth: Ext aud canals clear, TMs without erythema, bulging. No erythema, swelling, or exudate on post pharynx.  Tonsils not swollen or erythematous. Hearing normal.  Neck: Supple, thyroid normal.  Respiratory: Respiratory effort normal, BS equal bilaterally without rales, rhonchi, wheezing or stridor.  Cardio: RRR with no MRGs, distant heart sounds due to body habitus. Brisk peripheral pulses without edema.  Abdomen: Soft, + BS, morbidly obese, Non tender, no guarding, rebound, hernias, masses. Lymphatics: Non tender without lymphadenopathy.  Musculoskeletal: Full ROM, 5/5  strength, antalgic gait, difficult to stand due to weight Skin: Warm, dry without rashes, lesions, ecchymosis.  Neuro: Cranial nerves intact. No cerebellar symptoms. Psych: Awake and oriented X 3, normal affect, Insight and Judgment appropriate.      Vicie Mutters, PA-C 5:10 PM [facility]

## 2016-06-18 NOTE — Patient Instructions (Addendum)
Can stop metformin for now Will refill Hycodan one more time Continue gatorade/water mixture Stop the prednisone for now Continue the famotidine Will refill zofran as needed   Diarrhea, Adult Diarrhea is frequent loose and watery bowel movements. Diarrhea can make you feel weak and cause you to become dehydrated. Dehydration can make you tired and thirsty, cause you to have a dry mouth, and decrease how often you urinate. Diarrhea typically lasts 2-3 days. However, it can last longer if it is a sign of something more serious. It is important to treat your diarrhea as told by your health care provider. Follow these instructions at home: Eating and drinking Follow these recommendations as told by your health care provider:  Take an oral rehydration solution (ORS). This is a drink that is sold at pharmacies and retail stores.  Drink clear fluids, such as water, ice chips, diluted fruit juice, and low-calorie sports drinks.  Eat bland, easy-to-digest foods in small amounts as you are able. These foods include bananas, applesauce, rice, lean meats, toast, and crackers.  Avoid drinking fluids that contain a lot of sugar or caffeine, such as energy drinks, sports drinks, and soda.  Avoid alcohol.  Avoid spicy or fatty foods. General instructions  Drink enough fluid to keep your urine clear or pale yellow.  Wash your hands often. If soap and water are not available, use hand sanitizer.  Make sure that all people in your household wash their hands well and often.  Take over-the-counter and prescription medicines only as told by your health care provider.  Rest at home while you recover.  Watch your condition for any changes.  Take a warm bath to relieve any burning or pain from frequent diarrhea episodes.  Keep all follow-up visits as told by your health care provider. This is important. Contact a health care provider if:  You have a fever.  Your diarrhea gets worse.  You have  new symptoms.  You cannot keep fluids down.  You feel light-headed or dizzy.  You have a headache  You have muscle cramps. Get help right away if:  You have chest pain.  You feel extremely weak or you faint.  You have bloody or black stools or stools that look like tar.  You have severe pain, cramping, or bloating in your abdomen.  You have trouble breathing or you are breathing very quickly.  Your heart is beating very quickly.  Your skin feels cold and clammy.  You feel confused.  You have signs of dehydration, such as:  Dark urine, very little urine, or no urine.  Cracked lips.  Dry mouth.  Sunken eyes.  Sleepiness.  Weakness. This information is not intended to replace advice given to you by your health care provider. Make sure you discuss any questions you have with your health care provider. Document Released: 05/02/2002 Document Revised: 09/20/2015 Document Reviewed: 01/16/2015 Elsevier Interactive Patient Education  2017 [employer].   Fatigue Introduction Fatigue is feeling tired all of the time, a lack of energy, or a lack of motivation. Occasional or mild fatigue is often a normal response to activity or life in general. However, long-lasting (chronic) or extreme fatigue may indicate an underlying medical condition. Follow these instructions at home: Watch your fatigue for any changes. The following actions may help to lessen any discomfort you are feeling:  Talk to your health care provider about how much sleep you need each night. Try to get the required amount every night.  Take medicines only  as directed by your health care provider.  Eat a healthy and nutritious diet. Ask your health care provider if you need help changing your diet.  Drink enough fluid to keep your urine clear or pale yellow.  Practice ways of relaxing, such as yoga, meditation, massage therapy, or acupuncture.  Exercise regularly.  Change situations that cause you  stress. Try to keep your work and personal routine reasonable.  Do not abuse illegal drugs.  Limit alcohol intake to no more than 1 drink per day for nonpregnant women and 2 drinks per day for men. One drink equals 12 ounces of beer, 5 ounces of wine, or 1 ounces of hard liquor.  Take a multivitamin, if directed by your health care provider. Contact a health care provider if:  Your fatigue does not get better.  You have a fever.  You have unintentional weight loss or gain.  You have headaches.  You have difficulty:  Falling asleep.  Sleeping throughout the night.  You feel angry, guilty, anxious, or sad.  You are unable to have a bowel movement (constipation).  You skin is dry.  Your legs or another part of your body is swollen. Get help right away if:  You feel confused.  Your vision is blurry.  You feel faint or pass out.  You have a severe headache.  You have severe abdominal, pelvic, or back pain.  You have chest pain, shortness of breath, or an irregular or fast heartbeat.  You are unable to urinate or you urinate less than normal.  You develop abnormal bleeding, such as bleeding from the rectum, vagina, nose, lungs, or nipples.  You vomit blood.  You have thoughts about harming yourself or committing suicide.  You are worried that you might harm someone else. This information is not intended to replace advice given to you by your health care provider. Make sure you discuss any questions you have with your health care provider. Document Released: 03/09/2007 Document Revised: 10/18/2015 Document Reviewed: 09/13/2013  2017 Elsevier

## 2016-06-19 LAB — HEPATIC FUNCTION PANEL
ALBUMIN: 3.6 g/dL (ref 3.6–5.1)
ALT: 37 U/L (ref 9–46)
AST: 20 U/L (ref 10–35)
Alkaline Phosphatase: 68 U/L (ref 40–115)
BILIRUBIN DIRECT: 0.2 mg/dL (ref <=0.2)
BILIRUBIN TOTAL: 0.7 mg/dL (ref 0.2–1.2)
Indirect Bilirubin: 0.5 mg/dL (ref 0.2–1.2)
Total Protein: 5.6 g/dL — ABNORMAL LOW (ref 6.1–8.1)

## 2016-06-19 LAB — BASIC METABOLIC PANEL WITH GFR
BUN: 20 mg/dL (ref 7–25)
CALCIUM: 8.8 mg/dL (ref 8.6–10.3)
CO2: 24 mmol/L (ref 20–31)
CREATININE: 0.88 mg/dL (ref 0.70–1.33)
Chloride: 102 mmol/L (ref 98–110)
GFR, Est African American: >89 mL/min (ref >=60)
GFR, Est Non African American: >89 mL/min (ref >=60)
Glucose, Bld: 116 mg/dL — ABNORMAL HIGH (ref 65–99)
Potassium: 4.8 mmol/L (ref 3.5–5.3)
SODIUM: 140 mmol/L (ref 135–146)

## 2016-06-19 LAB — URINALYSIS, ROUTINE W REFLEX MICROSCOPIC
Bilirubin Urine: NEGATIVE
GLUCOSE, UA: NEGATIVE
HGB URINE DIPSTICK: NEGATIVE
KETONES UR: NEGATIVE
LEUKOCYTES UA: NEGATIVE
Nitrite: NEGATIVE
PROTEIN: NEGATIVE
Specific Gravity, Urine: 1.02 (ref 1.001–1.035)
pH: 5 (ref 5.0–8.0)

## 2016-06-19 LAB — MAGNESIUM: MAGNESIUM: 2 mg/dL (ref 1.5–2.5)

## 2016-06-20 LAB — URINE CULTURE: Organism ID, Bacteria: NO GROWTH

## 2016-06-24 ENCOUNTER — Other Ambulatory Visit: Payer: BLUE CROSS/BLUE SHIELD

## 2016-06-24 ENCOUNTER — Other Ambulatory Visit: Payer: Self-pay

## 2016-06-24 DIAGNOSIS — N3010 Interstitial cystitis (chronic) without hematuria: Secondary | ICD-10-CM | POA: Diagnosis not present

## 2016-06-24 DIAGNOSIS — N401 Enlarged prostate with lower urinary tract symptoms: Secondary | ICD-10-CM | POA: Diagnosis not present

## 2016-06-24 DIAGNOSIS — E291 Testicular hypofunction: Secondary | ICD-10-CM | POA: Diagnosis not present

## 2016-06-24 DIAGNOSIS — N3944 Nocturnal enuresis: Secondary | ICD-10-CM | POA: Diagnosis not present

## 2016-06-24 DIAGNOSIS — Z79899 Other long term (current) drug therapy: Secondary | ICD-10-CM

## 2016-06-24 LAB — CBC WITH DIFFERENTIAL/PLATELET
BASOS PCT: 1 %
Basophils Absolute: 118 cells/uL (ref 0–200)
EOS PCT: 3 %
Eosinophils Absolute: 354 cells/uL (ref 15–500)
HEMATOCRIT: 49.5 % (ref 38.5–50.0)
Hemoglobin: 16.2 g/dL (ref 13.2–17.1)
LYMPHS PCT: 22 %
Lymphs Abs: 2596 cells/uL (ref 850–3900)
MCH: 29.8 pg (ref 27.0–33.0)
MCHC: 32.7 g/dL (ref 32.0–36.0)
MCV: 91.2 fL (ref 80.0–100.0)
MONOS PCT: 10 %
MPV: 9.8 fL (ref 7.5–12.5)
Monocytes Absolute: 1180 cells/uL — ABNORMAL HIGH (ref 200–950)
Neutro Abs: 7552 cells/uL (ref 1500–7800)
Neutrophils Relative %: 64 %
PLATELETS: 248 10*3/uL (ref 140–400)
RBC: 5.43 MIL/uL (ref 4.20–5.80)
RDW: 14 % (ref 11.0–15.0)
WBC: 11.8 10*3/uL — AB (ref 3.8–10.8)

## 2016-06-25 ENCOUNTER — Other Ambulatory Visit: Payer: Self-pay

## 2016-06-25 LAB — BASIC METABOLIC PANEL WITH GFR
BUN: 18 mg/dL (ref 7–25)
CALCIUM: 8.8 mg/dL (ref 8.6–10.3)
CO2: 28 mmol/L (ref 20–31)
Chloride: 106 mmol/L (ref 98–110)
Creat: 1.02 mg/dL (ref 0.70–1.33)
GFR, EST NON AFRICAN AMERICAN: 82 mL/min (ref >=60)
GLUCOSE: 109 mg/dL — AB (ref 65–99)
Potassium: 4.6 mmol/L (ref 3.5–5.3)
SODIUM: 143 mmol/L (ref 135–146)

## 2016-06-26 ENCOUNTER — Encounter: Payer: Self-pay | Admitting: Physician Assistant

## 2016-06-27 ENCOUNTER — Ambulatory Visit (INDEPENDENT_AMBULATORY_CARE_PROVIDER_SITE_OTHER): Payer: BLUE CROSS/BLUE SHIELD | Admitting: Physician Assistant

## 2016-06-27 ENCOUNTER — Encounter: Payer: Self-pay | Admitting: Physician Assistant

## 2016-06-27 VITALS — BP 120/80 | HR 103 | Temp 97.3°F | Resp 16 | Ht 73.0 in | Wt 376.0 lb

## 2016-06-27 DIAGNOSIS — M940 Chondrocostal junction syndrome [Tietze]: Secondary | ICD-10-CM

## 2016-06-27 DIAGNOSIS — R109 Unspecified abdominal pain: Secondary | ICD-10-CM

## 2016-06-27 DIAGNOSIS — R10A0 Flank pain, unspecified side: Secondary | ICD-10-CM

## 2016-06-27 DIAGNOSIS — R6883 Chills (without fever): Secondary | ICD-10-CM

## 2016-06-27 LAB — CBC WITH DIFFERENTIAL/PLATELET
BASOS PCT: 0 %
Basophils Absolute: 0 cells/uL (ref 0–200)
Eosinophils Absolute: 363 cells/uL (ref 15–500)
Eosinophils Relative: 3 %
HEMATOCRIT: 49.4 % (ref 38.5–50.0)
Hemoglobin: 16.3 g/dL (ref 13.2–17.1)
LYMPHS PCT: 30 %
Lymphs Abs: 3630 cells/uL (ref 850–3900)
MCH: 30.1 pg (ref 27.0–33.0)
MCHC: 33 g/dL (ref 32.0–36.0)
MCV: 91.1 fL (ref 80.0–100.0)
MONO ABS: 1331 {cells}/uL — AB (ref 200–950)
MONOS PCT: 11 %
MPV: 9.6 fL (ref 7.5–12.5)
NEUTROS ABS: 6776 {cells}/uL (ref 1500–7800)
Neutrophils Relative %: 56 %
PLATELETS: 247 10*3/uL (ref 140–400)
RBC: 5.42 MIL/uL (ref 4.20–5.80)
RDW: 14.1 % (ref 11.0–15.0)
WBC: 12.1 10*3/uL — AB (ref 3.8–10.8)

## 2016-06-27 LAB — HEPATIC FUNCTION PANEL
ALK PHOS: 72 U/L (ref 40–115)
ALT: 65 U/L — ABNORMAL HIGH (ref 9–46)
AST: 32 U/L (ref 10–35)
Albumin: 3.7 g/dL (ref 3.6–5.1)
BILIRUBIN INDIRECT: 0.7 mg/dL (ref 0.2–1.2)
BILIRUBIN TOTAL: 0.9 mg/dL (ref 0.2–1.2)
Bilirubin, Direct: 0.2 mg/dL (ref <=0.2)
Total Protein: 6.2 g/dL (ref 6.1–8.1)

## 2016-06-27 LAB — BASIC METABOLIC PANEL WITH GFR
BUN: 13 mg/dL (ref 7–25)
CO2: 30 mmol/L (ref 20–31)
Calcium: 9.1 mg/dL (ref 8.6–10.3)
Chloride: 100 mmol/L (ref 98–110)
Creat: 0.87 mg/dL (ref 0.70–1.33)
Glucose, Bld: 131 mg/dL — ABNORMAL HIGH (ref 65–99)
POTASSIUM: 4.4 mmol/L (ref 3.5–5.3)
Sodium: 139 mmol/L (ref 135–146)

## 2016-06-27 LAB — AMYLASE: Amylase: 39 U/L (ref 0–105)

## 2016-06-27 LAB — LACTATE DEHYDROGENASE: LDH: 221 U/L (ref 120–250)

## 2016-06-27 MED ORDER — PREDNISONE 20 MG PO TABS: ORAL_TABLET | ORAL | 0 refills | Status: AC

## 2016-06-27 MED ORDER — LEVOFLOXACIN 500 MG PO TABS: 500.0000 mg | ORAL_TABLET | Freq: Every day | ORAL | 0 refills | Status: DC

## 2016-06-27 NOTE — Progress Notes (Signed)
Subjective:    Patient ID: Mark Harrell, male    DOB: 11-May-1961, 56 y.o.   MRN: [id number]  HPI 56 y.o. obese WM with history of diverticulitis, COPD with asthma, IBS, chronic pain on multiple medications presents with Ab pain. He has had 2 admission to the hospital, one for COPD exacerbation and one for N/V possible gastroenteritis, that admission he did not have any AB imaging.  Has been having right and left flank pain, worse right side, tender to light touch, started in Dec, sharp pain, worse with turning, sitting up, worse with movement, progressively worse. He has had 3 total AB surgery in the past, 1 exploratory, fistula repair x 2. He continues to have nausea, chills, still coughing off and on, some SOB. He is on gabapentin, 3 pills 3 x a day, oxycodone 10mg  tries to take as little as possible has tried for this pain and it has not helped, baclofen 3 x a day. Nothing makes it better other than staying still.   Blood pressure 120/80, pulse (!) 103, temperature 97.3 F (36.3 C), resp. rate 16, height 6\' 1"  (1.854 m), weight (!) 376 lb (170.6 kg), SpO2 95 %.  Medications Current Outpatient Prescriptions on File Prior to Visit  Medication Sig  . albuterol (PROAIR HFA) 108 (90 Base) MCG/ACT inhaler INHALE 2 PUFFS INTO THE LUNGS EVERY 4 (FOUR) HOURS AS NEEDED FOR WHEEZING OR SHORTNESS OF BREATH.  [name] alfuzosin (UROXATRAL) 10 MG 24 hr tablet Take 10 mg by mouth daily.    [name] anastrozole (ARIMIDEX) 1 MG tablet Take 1 mg by mouth 2 (two) times daily.   [name] atenolol (TENORMIN) 100 MG tablet TAKE 1 TABLET BY MOUTH EVERY DAY  . baclofen (LIORESAL) 10 MG tablet Take 10 mg by mouth daily as needed for muscle spasms.   . benzonatate (TESSALON) 200 MG capsule TAKE ONE CAPSULE 3 TIMES A DAY AS NEEDED FOR COUGH  . budesonide-formoterol (SYMBICORT) 160-4.5 MCG/ACT inhaler Inhale 2 puffs into the lungs 2 (two) times daily.  . Calcium Carbonate-Vitamin D (CALCIUM + D PO) Take 500 mg by mouth 2 (two) times  daily.   . cetaphil (CETAPHIL) lotion Apply topically 2 (two) times daily.  . cetirizine (ZYRTEC) 10 MG tablet Take 10 mg by mouth daily.  . Cholecalciferol (VITAMIN D PO) Take 10,000-20,000 Units by mouth 2 (two) times daily. 20000 in the morning and 10000 in the evening  . Cinnamon 500 MG capsule Take 500 mg by mouth daily.  . cyclobenzaprine (FLEXERIL) 10 MG tablet Take 10 mg by mouth 3 (three) times daily as needed for muscle spasms.   [name] desmopressin (DDAVP) 0.2 MG tablet Take 200 mcg by mouth at bedtime.  . donepezil (ARICEPT) 23 MG TABS tablet Take 23 mg by mouth at bedtime.  . DYMISTA 137-50 MCG/ACT SUSP PLACE 2 SPRAYS INTO BOTH NOSTRILS 2 (TWO) TIMES DAILY.  . famotidine (PEPCID) 20 MG tablet Take 1 tablet (20 mg total) by mouth daily.  [name] gabapentin (NEURONTIN) 600 MG tablet 1,200 mg 3 (three) times daily.   [name] guaiFENesin (MUCINEX) 600 MG 12 hr tablet Take 600 mg by mouth 2 (two) times daily.   [name] HYDROcodone-homatropine (HYCODAN) 5-1.5 MG/5ML syrup Take 5 mLs by mouth every 6 (six) hours as needed for cough.  . L-METHYLFOLATE CALCIUM PO Take 1 tablet by mouth daily.   . memantine (NAMENDA) 10 MG tablet Take 10 mg by mouth 2 (two) times daily.   . Methylfol-Algae-B12-Acetylcyst (CEREFOLIN NAC) 6-90.314-2-600 MG TABS  Take 1 tablet by mouth daily.   . montelukast (SINGULAIR) 10 MG tablet Take 1 tablet (10 mg total) by mouth daily.  . Multiple Vitamins-Minerals (MULTIVITAMIN WITH MINERALS) tablet Take 1 tablet by mouth daily.  . ondansetron (ZOFRAN ODT) 4 MG disintegrating tablet Take 1 tablet (4 mg total) by mouth every 6 (six) hours as needed for nausea or vomiting.  [name] oxyCODONE-acetaminophen (PERCOCET) 10-325 MG tablet Take 1 tablet by mouth every 4 (four) hours as needed for pain.  . pentosan polysulfate (ELMIRON) 100 MG capsule Take 200 mg by mouth 3 (three) times daily before meals.   . Phosphatidylserine-DHA-EPA (VAYACOG) 100-19.5-6.5 MG CAPS Take 1 capsule by mouth 2 (two) times  daily.   . pravastatin (PRAVACHOL) 40 MG tablet TAKE 1 TABLET BY MOUTH AT BEDTIME  . sertraline (ZOLOFT) 50 MG tablet Take 50 mg by mouth 2 (two) times daily.   . tadalafil (CIALIS) 5 MG tablet Take 5 mg by mouth every evening.   . triamcinolone cream (KENALOG) 0.1 % APPLY TOPICALLY 4 TIMES A DAY  . trospium (SANCTURA) 20 MG tablet 20 mg 2 (two) times daily.   [name] zinc gluconate 50 MG tablet Take 50 mg by mouth daily.   No current facility-administered medications on file prior to visit.     Problem list He has Hypertension; Hyperlipidemia; Migraine; BPH (benign prostatic hyperplasia); Testosterone Deficiency; IBS (irritable bowel syndrome); Partial complex seizure disorder with intractable epilepsy ([place]); Depression, controlled; COPD with asthma ([place]); Diverticula of colon; Positive TB test; Vitamin D deficiency; Prediabetes; Obesity, Class III, BMI 40-49.9 (morbid obesity) ([place]); GERD (gastroesophageal reflux disease); Medication management; SDAT; Asthma-COPD overlap syndrome ([place]); OSA (obstructive sleep apnea); BMI 47.46,   adult ([place]); Glucose intolerance (impaired glucose tolerance); Polypharmacy; Recurrent infections; Chronic nonseasonal allergic rhinitis due to pollen; Acute medial meniscal tear, left, subsequent encounter; Acute exacerbation of COPD with asthma ([place]); COPD (chronic obstructive pulmonary disease) ([place]); DM (diabetes mellitus) ([place]); Acute asthma exacerbation; Sepsis ([place]); Nausea vomiting and diarrhea; Fever; and Irritant contact dermatitis due to frequent handwashing on his problem list.   Review of Systems  Constitutional: Positive for appetite change (decreased), chills and fatigue. Negative for activity change, diaphoresis, fever and unexpected weight change.  HENT: Negative.   Respiratory: Positive for cough. Negative for apnea, choking, chest tightness, shortness of breath, wheezing and stridor.   Cardiovascular: Negative.   Gastrointestinal: Positive for abdominal  distention, abdominal pain and nausea. Negative for anal bleeding, blood in stool, constipation, diarrhea (frequent stools but solid), rectal pain and vomiting.  Genitourinary: Negative.  Negative for dysuria.  Musculoskeletal: Positive for arthralgias, back pain and myalgias. Negative for gait problem, joint swelling, neck pain and neck stiffness.  Skin: Negative.  Negative for rash.       Objective:   Physical Exam  Constitutional: He appears well-developed and well-nourished. No distress.  HENT:  Head: Normocephalic and atraumatic.  Eyes: Conjunctivae are normal. Pupils are equal, round, and reactive to light.  Neck: Normal range of motion. Neck supple.  Cardiovascular: Normal rate and regular rhythm.   Pulmonary/Chest: Effort normal and breath sounds normal. He has no wheezes. Tenderness:  chest wall tenderness.  Abdominal: Soft. Bowel sounds are normal. There is tenderness (diffuse tenderness, difficult exam with body habitus). There is no rebound and no guarding.  Musculoskeletal: Normal range of motion. He exhibits tenderness (chest wall/back).  Lymphadenopathy:    He has no cervical adenopathy.  Neurological: He is alert.  Skin: Skin is warm. No rash (no rash) noted.  He is diaphoretic.       Assessment & Plan:  Flank pain bilateral Likely costochondritis from vomiting and cough however patient also has diffuse AB pain, chills, decreased appetite will get labs to rule out infection, colitis, pancreatitis, gallbladder, diverticulitis Levaquin x 10 days Prednisone x 10 days Go ER if any worsening Ab pain, CP.

## 2016-06-27 NOTE — Patient Instructions (Signed)
Costochondritis Costochondritis is swelling and irritation (inflammation) of the tissue (cartilage) that connects your ribs to your breastbone (sternum). This causes pain in the front of your chest. The pain usually starts gradually and involves more than one rib. What are the causes? The exact cause of this condition is not always known. It results from stress on the cartilage where your ribs attach to your sternum. The cause of this stress could be:  Chest injury (trauma).  Exercise or activity, such as lifting.  Severe coughing. What increases the risk? You may be at higher risk for this condition if you:  Are male.  Are 30?56 years old.  Recently started a new exercise or work activity.  Have low levels of vitamin D.  Have a condition that makes you cough frequently. What are the signs or symptoms? The main symptom of this condition is chest pain. The pain:  Usually starts gradually and can be sharp or dull.  Gets worse with deep breathing, coughing, or exercise.  Gets better with rest.  May be worse when you press on the sternum-rib connection (tenderness). How is this diagnosed? This condition is diagnosed based on your symptoms, medical history, and a physical exam. Your health care provider will check for tenderness when pressing on your sternum. This is the most important finding. You may also have tests to rule out other causes of chest pain. These may include:  A chest X-ray to check for lung problems.  An electrocardiogram (ECG) to see if you have a heart problem that could be causing the pain.  An imaging scan to rule out a chest or rib fracture. How is this treated? This condition usually goes away on its own over time. Your health care provider may prescribe an NSAID to reduce pain and inflammation. Your health care provider may also suggest that you:  Rest and avoid activities that make pain worse.  Apply heat or cold to the area to reduce pain and  inflammation.  Do exercises to stretch your chest muscles. If these treatments do not help, your health care provider may inject a numbing medicine at the sternum-rib connection to help relieve the pain. Follow these instructions at home:  Avoid activities that make pain worse. This includes any activities that use chest, abdominal, and side muscles.  If directed, put ice on the painful area:  Put ice in a plastic bag.  Place a towel between your skin and the bag.  Leave the ice on for 20 minutes, 2-3 times a day.  If directed, apply heat to the affected area as often as told by your health care provider. Use the heat source that your health care provider recommends, such as a moist heat pack or a heating pad.  Place a towel between your skin and the heat source.  Leave the heat on for 20-30 minutes.  Remove the heat if your skin turns bright red. This is especially important if you are unable to feel pain, heat, or cold. You may have a greater risk of getting burned.  Take over-the-counter and prescription medicines only as told by your health care provider.  Return to your normal activities as told by your health care provider. Ask your health care provider what activities are safe for you.  Keep all follow-up visits as told by your health care provider. This is important. Contact a health care provider if:  You have chills or a fever.  Your pain does not go away or it gets worse.    You have a cough that does not go away (is persistent). Get help right away if:  You have shortness of breath. This information is not intended to replace advice given to you by your health care provider. Make sure you discuss any questions you have with your health care provider. Document Released: 02/19/2005 Document Revised: 11/30/2015 Document Reviewed: 09/05/2015 Elsevier Interactive Patient Education  2017 [employer]   Flank Pain Flank pain refers to pain that is located on the side  of the body between the upper abdomen and the back. The pain may occur over a short period of time (acute) or may be long-term or reoccurring (chronic). It may be mild or severe. Flank pain can be caused by many things. CAUSES  Some of the more common causes of flank pain include:  Muscle strains.   Muscle spasms.   A disease of your spine (vertebral disk disease).   A lung infection (pneumonia).   Fluid around your lungs (pulmonary edema).   A kidney infection.   Kidney stones.   A very painful skin rash caused by the chickenpox virus (shingles).   Gallbladder disease.  [place] care will depend on the cause of your pain. In general,  Rest as directed by your caregiver.  Drink enough fluids to keep your urine clear or pale yellow.  Only take over-the-counter or prescription medicines as directed by your caregiver. Some medicines may help relieve the pain.  Tell your caregiver about any changes in your pain.  Follow up with your caregiver as directed. SEEK IMMEDIATE MEDICAL CARE IF:   Your pain is not controlled with medicine.   You have new or worsening symptoms.  Your pain increases.   You have abdominal pain.   You have shortness of breath.   You have persistent nausea or vomiting.   You have swelling in your abdomen.   You feel faint or pass out.   You have blood in your urine.  You have a fever or persistent symptoms for more than 2-3 days.  You have a fever and your symptoms suddenly get worse. MAKE SURE YOU:   Understand these instructions.  Will watch your condition.  Will get help right away if you are not doing well or get worse. This information is not intended to replace advice given to you by your health care provider. Make sure you discuss any questions you have with your health care provider. Document Released: 07/03/2005 Document Revised: 02/04/2012 Document Reviewed: 02/13/2015 Elsevier Interactive  Patient Education  2017 [employer].

## 2016-07-01 ENCOUNTER — Ambulatory Visit
Admission: RE | Admit: 2016-07-01 | Discharge: 2016-07-01 | Disposition: A | Discharge status: Home / Self Care | Payer: BLUE CROSS/BLUE SHIELD | Source: Ambulatory Visit | Attending: Physician Assistant | Admitting: Physician Assistant

## 2016-07-01 DIAGNOSIS — R6883 Chills (without fever): Secondary | ICD-10-CM

## 2016-07-01 DIAGNOSIS — R109 Unspecified abdominal pain: Secondary | ICD-10-CM | POA: Diagnosis not present

## 2016-07-01 MED ORDER — IOPAMIDOL (ISOVUE-300) INJECTION 61%
125.0000 mL | Freq: Once | INTRAVENOUS | Status: AC | PRN
  Administered 2016-07-01: 125 mL via INTRAVENOUS

## 2016-07-16 ENCOUNTER — Ambulatory Visit (INDEPENDENT_AMBULATORY_CARE_PROVIDER_SITE_OTHER): Payer: BLUE CROSS/BLUE SHIELD | Admitting: Allergy & Immunology

## 2016-07-16 ENCOUNTER — Emergency Department (HOSPITAL_COMMUNITY): Payer: BLUE CROSS/BLUE SHIELD

## 2016-07-16 ENCOUNTER — Encounter: Payer: Self-pay | Admitting: Allergy & Immunology

## 2016-07-16 ENCOUNTER — Emergency Department (HOSPITAL_COMMUNITY)
Admission: EM | Admit: 2016-07-16 | Discharge: 2016-07-16 | Disposition: A | Discharge status: Home / Self Care | Payer: BLUE CROSS/BLUE SHIELD | Attending: Emergency Medicine | Admitting: Emergency Medicine

## 2016-07-16 ENCOUNTER — Encounter (HOSPITAL_COMMUNITY): Payer: Self-pay

## 2016-07-16 VITALS — BP 76/54 | HR 60 | Temp 99.0°F | Resp 24 | Ht 73.0 in | Wt 370.2 lb

## 2016-07-16 DIAGNOSIS — Z87891 Personal history of nicotine dependence: Secondary | ICD-10-CM | POA: Diagnosis not present

## 2016-07-16 DIAGNOSIS — I959 Hypotension, unspecified: Secondary | ICD-10-CM

## 2016-07-16 DIAGNOSIS — J45909 Unspecified asthma, uncomplicated: Secondary | ICD-10-CM | POA: Diagnosis not present

## 2016-07-16 DIAGNOSIS — Z79899 Other long term (current) drug therapy: Secondary | ICD-10-CM | POA: Diagnosis not present

## 2016-07-16 DIAGNOSIS — I517 Cardiomegaly: Secondary | ICD-10-CM | POA: Diagnosis not present

## 2016-07-16 DIAGNOSIS — J449 Chronic obstructive pulmonary disease, unspecified: Secondary | ICD-10-CM | POA: Insufficient documentation

## 2016-07-16 DIAGNOSIS — Z7984 Long term (current) use of oral hypoglycemic drugs: Secondary | ICD-10-CM | POA: Diagnosis not present

## 2016-07-16 DIAGNOSIS — B999 Unspecified infectious disease: Secondary | ICD-10-CM

## 2016-07-16 DIAGNOSIS — J3089 Other allergic rhinitis: Secondary | ICD-10-CM

## 2016-07-16 DIAGNOSIS — E860 Dehydration: Secondary | ICD-10-CM | POA: Diagnosis not present

## 2016-07-16 DIAGNOSIS — I10 Essential (primary) hypertension: Secondary | ICD-10-CM | POA: Diagnosis not present

## 2016-07-16 LAB — CBC WITH DIFFERENTIAL/PLATELET
BASOS ABS: 0.1 10*3/uL (ref 0.0–0.1)
Basophils Relative: 0 %
EOS ABS: 0.2 10*3/uL (ref 0.0–0.7)
EOS PCT: 2 %
HCT: 49.3 % (ref 39.0–52.0)
HEMOGLOBIN: 16 g/dL (ref 13.0–17.0)
LYMPHS ABS: 3.5 10*3/uL (ref 0.7–4.0)
LYMPHS PCT: 31 %
MCH: 30.1 pg (ref 26.0–34.0)
MCHC: 32.5 g/dL (ref 30.0–36.0)
MCV: 92.7 fL (ref 78.0–100.0)
Monocytes Absolute: 0.9 10*3/uL (ref 0.1–1.0)
Monocytes Relative: 8 %
NEUTROS PCT: 59 %
Neutro Abs: 6.6 10*3/uL (ref 1.7–7.7)
PLATELETS: 252 10*3/uL (ref 150–400)
RBC: 5.32 MIL/uL (ref 4.22–5.81)
RDW: 13.9 % (ref 11.5–15.5)
WBC: 11.2 10*3/uL — AB (ref 4.0–10.5)

## 2016-07-16 LAB — I-STAT CHEM 8, ED
BUN: 16 mg/dL (ref 6–20)
CREATININE: 0.9 mg/dL (ref 0.61–1.24)
Calcium, Ion: 1.18 mmol/L (ref 1.15–1.40)
Chloride: 102 mmol/L (ref 101–111)
GLUCOSE: 105 mg/dL — AB (ref 65–99)
HCT: 48 % (ref 39.0–52.0)
HEMOGLOBIN: 16.3 g/dL (ref 13.0–17.0)
POTASSIUM: 3.9 mmol/L (ref 3.5–5.1)
Sodium: 140 mmol/L (ref 135–145)
TCO2: 28 mmol/L (ref 0–100)

## 2016-07-16 LAB — URINALYSIS, ROUTINE W REFLEX MICROSCOPIC
BACTERIA UA: NONE SEEN
Bilirubin Urine: NEGATIVE
GLUCOSE, UA: NEGATIVE mg/dL
Hgb urine dipstick: NEGATIVE
KETONES UR: 5 mg/dL — AB
Leukocytes, UA: NEGATIVE
NITRITE: NEGATIVE
PROTEIN: 30 mg/dL — AB
RBC / HPF: NONE SEEN RBC/hpf (ref 0–5)
Specific Gravity, Urine: 1.033 — ABNORMAL HIGH (ref 1.005–1.030)
Squamous Epithelial / LPF: NONE SEEN
WBC UA: NONE SEEN WBC/hpf (ref 0–5)
pH: 5 (ref 5.0–8.0)

## 2016-07-16 LAB — I-STAT TROPONIN, ED
TROPONIN I, POC: 0 ng/mL (ref 0.00–0.08)
Troponin i, poc: 0 ng/mL (ref 0.00–0.08)

## 2016-07-16 LAB — I-STAT CG4 LACTIC ACID, ED
Lactic Acid, Venous: 2.03 mmol/L (ref 0.5–1.9)
Lactic Acid, Venous: 1.34 mmol/L (ref 0.5–1.9)

## 2016-07-16 MED ORDER — SODIUM CHLORIDE 0.9 % IV BOLUS (SEPSIS)
1000.0000 mL | Freq: Once | INTRAVENOUS | Status: AC
  Administered 2016-07-16: 1000 mL via INTRAVENOUS

## 2016-07-16 NOTE — Progress Notes (Signed)
FOLLOW UP  Date of Service/Encounter:  07/16/16   Assessment:   Recurrent infections  Asthma-COPD overlap syndrome (HCC)  Chronic nonseasonal allergic rhinitis    Asthma Reportables:  Severity: severe persistent  Risk: high Control: not well controlled   Plan/Recommendations:   1. Hypotension in the setting of a history of recurrent sepsis - With the history of recent septic episodes, I am very concerned another episode of sepsis. - We strongly recommended that he go to the ED, but he was quite hesitant. - We did threaten to call EMS and finally he relented and said that he would drive himself across the street to the ED. - I did call the ED and talk to the Triage RN to let her know that he was on his way.   2. Recurrent infections (sepsis, UTIs, pulmonary infections) - Although his previous immune workup was fairly normal aside from mild hypogammaglobulinemia, I still feel that Mark Harrell would do better with immunoglobulin replacement therapy.  - The cost of the immunoglobulin replacement would more than be made up by the decreased hospitalizations.  - We will start Hizentra dosing at 400mg .kg/month (~16gm every weekly)  - We will get some labs today: IgG, IgA, IgM, and flow cytometry - I will talk to [place] who should be contacting Mark Harrell to get information on immunoglobulin replacement therapy.  3. Asthma-COPD overlap syndrome - Lung function actually looked good today. - He should make an appointment to start his Nucala next week or so.  - Daily controller medication(s): Symbicort 160/4.5 two puffs twice daily with spacer - Rescue medications: ProAir 4 puffs every 4-6 hours as needed or albuterol nebulizer one vial puffs every 4-6 hours as needed - Asthma control goals:  * Full participation in all desired activities (may need albuterol before activity) * Albuterol use two time or less a week on average (not counting use with activity) * Cough interfering with  sleep two time or less a month * Oral steroids no more than once a year * No hospitalizations  4. Chronic allergic rhinitis (sweet vernal grass pollen, box elder tree pollen, cat pollen, weed mix, ragweed mix, all mold mixes, cockroach, and dust mite) - Continue with Dymista 2 sprays per nostril 1-2 times daily. - Use cetirizine 10mg  daily as needed for breakthrough symptoms. - We will consider allergy shots once he is more stable.   5. Return in about 3 months (around 10/13/2016).   Subjective:   Mark Harrell is a 56 y.o. male presenting today for follow up of  Chief Complaint  Patient presents with  . Asthma    Mark Harrell has a history of the following: Patient Active Problem List   Diagnosis Date Noted  . Irritant contact dermatitis due to frequent handwashing 06/08/2016  . Sepsis ([place]) 06/03/2016  . Nausea vomiting and diarrhea 06/03/2016  . Fever 06/03/2016  . Acute asthma exacerbation 05/30/2016  . Acute exacerbation of COPD with asthma ([place]) 05/29/2016  . COPD (chronic obstructive pulmonary disease) ([place]) 05/29/2016  . DM (diabetes mellitus) ([place]) 05/29/2016  . Acute medial meniscal tear, left, subsequent encounter 04/23/2016  . Recurrent infections 03/29/2016  . Chronic nonseasonal allergic rhinitis due to pollen 03/29/2016  . Glucose intolerance (impaired glucose tolerance) 01/16/2016  . Polypharmacy 01/16/2016  . BMI 47.46,   adult ([place]) 03/06/2015  . SDAT 02/05/2015  . Asthma-COPD overlap syndrome ([place]) 02/05/2015  . OSA (obstructive sleep apnea) 02/05/2015  . Medication management 08/02/2014  . GERD (gastroesophageal  reflux disease) 05/09/2014  . Obesity, Class III, BMI 40-49.9 (morbid obesity) ([place]) 02/01/2014  . Vitamin D deficiency 08/01/2013  . Prediabetes 08/01/2013  . 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 Diverticula of colon 05/07/2011  . Hypertension 01/31/2011  . Hyperlipidemia 01/31/2011  . Migraine 01/31/2011  . BPH (benign prostatic hyperplasia)  01/31/2011  . Testosterone Deficiency 01/31/2011  . IBS (irritable bowel syndrome) 01/31/2011  . Partial complex seizure disorder with intractable epilepsy ([place]) 01/31/2011  . Depression, controlled 01/31/2011  . COPD with asthma ([place]) 01/31/2011    History obtained from: chart review and patient.  Mark Harrell was referred by [name], MD.     Mark Harrell is a 56 y.o. male presenting for a follow up visit. He has a very complicated past medical history including recurrent sepsis requiring hospitalizations. He was most recently hospitalized earlier this month. He finished a course of Levaquin and prednisone last week. He has a history of severe persistent asthma as well as allergic rhinitis. We have done an immune workup in the past which was largely normal aside from a slightly low IgG which was felt to be secondary to chronic prednisone use. Following my last visit with him in November 2017, he was diagnosed with a UTI and treated with amoxicillin for one month. Then he came to see my partner Dr. Nelva Bush at the end of December for difficulty with his asthma, which was treated with an azithromycin course and a prednisone burst.   Unfortunately, after his visit with Dr. Nelva Bush, he ended up going to the hospital in January 4th and was treated for a COPD exacerbation. Respiratory panel was positive for parainfluenza. He was on systemic steroids as well as board spectrum antibiotics. He was eventually discharged on a course of Levaquin on January 7th. Then on January 9th, he was readmitted with nausea, vomiting, diarrhea, and abdominal pain. This was felt to be sepsis secondary to gastroenteritis. Viral testing showed that he was positive for norovirus. He remained in the hospital through 06/09/16.   Since being discharged from the hospital, he reports that he has actually felt quite good. He does endorse some fatigue as well as some sweating today, but otherwise has been fine. His breathing is  under good control. He remains on Symbicort 2 puffs in the morning and 2 puffs at night. He denies recent use of his rescue inhaler since the hospitalization. He is on Dymista 2 sprays once daily. He has had no recent fevers. He has not actually felt this good in quite some time. From an allergic rhinitis perspective, his symptoms have remained fairly stable with the use of the Dymista as well as the cetirizine. We have deferred initiation of immunotherapy given his rather tenuous respiratory status in the past.   He was noted to be hypotensive on admission to the clinic today. This was rechecked on multiple occasions during his stay here. The remainder of his vitals were fairly normal aside from a slightly low pulse of 58 (he is on a beta blocker). He reports normal urine output and appetite. His spirometry today is actually quite good for him. He does have high blood pressure and has remained on his current dose of atenolol for several months now.   Otherwise, there have been no changes to his past medical history, surgical history, family history, or social history. He is currently staying with his mother and his uncle at his mother's home until he regains enough strength to live independently once again.  Review of Systems: a 14-point review of systems is pertinent for what is mentioned in HPI.  Otherwise, all other systems were negative. Constitutional: negative other than that listed in the HPI Eyes: negative other than that listed in the HPI Ears, nose, mouth, throat, and face: negative other than that listed in the HPI Respiratory: negative other than that listed in the HPI Cardiovascular: negative other than that listed in the HPI Gastrointestinal: negative other than that listed in the HPI Genitourinary: negative other than that listed in the HPI Integument: negative other than that listed in the HPI Hematologic: negative other than that listed in the HPI Musculoskeletal: negative  other than that listed in the HPI Neurological: negative other than that listed in the HPI Allergy/Immunologic: negative other than that listed in the HPI    Objective:   Blood pressure (!) 76/54, pulse 60, temperature 99 F (37.2 C), temperature source Oral, resp. rate (!) 24, height 6\' 1"  (1.854 m), weight (!) 370 lb 3.2 oz (167.9 kg), SpO2 93 %. Body mass index is 48.84 kg/m.   BP was confirmed to be low by another CMA as well as myself.    Physical Exam:  General: Alert, interactive, in no acute distress. Cooperative with the exam. Obese male.   Eyes: No conjunctival injection present on the right, No conjunctival injection present on the left, PERRL bilaterally, No discharge on the right, No discharge on the left and No Horner-Trantas dots present Ears: Right TM pearly gray with normal light reflex, Left TM pearly gray with normal light reflex, Right TM intact without perforation and Left TM intact without perforation.  Nose/Throat: External nose within normal limits and septum midline, turbinates markedly edematous and pale with clear discharge, post-pharynx markedly erythematous with cobblestoning in the posterior oropharynx. Tonsils 2+ without exudates Neck: Supple without thyromegaly. Lungs: Decreased breath sounds bilaterally without wheezing, rhonchi or rales. No increased work of breathing. CV: Normal S1/S2, no murmurs. Capillary refill <2 seconds. Bradycardic with a HR of 58. Skin: Warm and dry, without lesions or rashes. Neuro:   Grossly intact. No focal deficits appreciated. Responsive to questions.   Diagnostic studies:  Spirometry: results normal (FEV1: 2.52/60%, FVC: 3.47/67%, FEV1/FVC: 72%).    Spirometry consistent with possible restrictive disease.   Allergy Studies: None     [name], MD [facility]

## 2016-07-16 NOTE — ED Triage Notes (Signed)
Pt was at his allergy clinic and had BP taken and it ready low in the [id number] systolic. Pt endorses weakness and has been sleeping a lot. Pt is on hypertension meds. BP 112/83. Pt had 2 episodes of sepsis due to flu and norovirus.

## 2016-07-16 NOTE — Patient Instructions (Addendum)
1. Hypotension - With the history of recent septic episodes, we strongly recommend that you go to the ED for an evaluation. - They will likely get blood work and give you IV fluids.   2. Recurrent infections - Although your previous immune workup was fairly normal, I still think you would do better with immunoglobulin replacement therapy.  - This will give you infection fighting antibodies to supplement your immune system - We will get some labs today: IgG, IgA, IgM, and flow cytometry. - I will talk to [place] who should be contacting you to get information on that.   3. Asthma-COPD overlap syndrome ([place]) - Lung function actually looked good today. - Daily controller medication(s): Symbicort 160/4.5 two puffs twice daily with spacer - Rescue medications: ProAir 4 puffs every 4-6 hours as needed or albuterol nebulizer one vial puffs every 4-6 hours as needed - Asthma control goals:  * Full participation in all desired activities (may need albuterol before activity) * Albuterol use two time or less a week on average (not counting use with activity) * Cough interfering with sleep two time or less a month * Oral steroids no more than once a year * No hospitalizations  4. Chronic allergic rhinitis (sweet vernal grass pollen, box elder tree pollen, cat pollen, weed mix, ragweed mix, all mold mixes, cockroach, and dust mite) - Continue with Dymista 2 sprays per nostril 1-2 times daily. - Use cetirizine 10mg  daily as needed for breakthrough symptoms.  5. Return in about 3 months (around 10/13/2016).  Please inform us of any Emergency Department visits, hospitalizations, or changes in symptoms. Call us before going to the ED for breathing or allergy symptoms since we might be able to fit you in for a sick visit. Feel free to contact us anytime with any questions, problems, or concerns.  It was a pleasure to see you again today!    Websites that have reliable patient information: 1. American  Academy of Asthma, Allergy, and Immunology: www.aaaai.org 2. Food Allergy Research and Education (FARE): foodallergy.org 3. Mothers of Asthmatics: http://www.asthmacommunitynetwork.org 4. American College of Allergy, Asthma, and Immunology: www.acaai.org

## 2016-07-16 NOTE — Discharge Instructions (Signed)
Do not take your blood pressure (atenolol) medication tomorrow and have your pcp recheck your blood pressure again tomorrow Drink at least 8 cups of water tomorrow

## 2016-07-16 NOTE — ED Notes (Signed)
Pt is asleep and is de-sating to 85% O2

## 2016-07-16 NOTE — ED Provider Notes (Signed)
[facility] DEPT Provider Note   CSN: [id number] Arrival date & time: 07/16/16  1643  By signing my name below, I, Mark Harrell, attest that this documentation has been prepared under the direction and in the presence of Rosario Adie, MD-Resident. Electronically Signed: Ethelle Lyon Harrell, Scribe. 07/16/2016. 6:58 PM.   History   Chief Complaint Chief Complaint  Patient presents with  . Hypotension   The history is provided by the patient. No language interpreter was used.   HPI Comments:  Mark Harrell is a morbidly obese 56 y.o. male with a h/o Costochondritis, Asthma, GERD, HLD, HTN, and IBS, who presents to the Emergency Department complaining of hypotension onset this afternoon. He states being referred here from his allergy clinic following a low BP. He states he was recently admitted in January for norvirus/sepsis, review of chart shows no bacterial source found (including c diff) and has had multiple rounds of steroids over last 8 weeks for costochondritis. He feels like he is still recovering from all of this and while feels "the best I have in the last 8 weeks," and did not want to come from clinic today to ED, admits he does feel unwell and been fatigued as of late, sleeping a lot more than his baseline. He has associated symptoms of non-radiating, chest inflammation in his rib cage onset past two months, mild light-headedness, nausea, chills, constant loose stools, and borderline temperature that has not been >100.4. He notes movement of arms, direct pressure and palpation exacerbates his chest inflammation though it doesn't radiate nor produce episodes of SOB/diaphoresis. Off steroids since last week. Pt has not had a prior stress test. Pt denies vomiting and no blood or black stools or hematemesis. States he was mostly asx during clinic except for mild lightheadedness during event where noted to be hypotensive. No cp/sob, calf pain, recent [place] trip, hemoptysis, hx of PE or ACS. No  medication adjustment of atenolol.    Past Medical History:  Diagnosis Date  . Anesthesia complication requiring reversal agent administration    ? from central apnea, very difficult to get off vent  . Arthritis    osteo  . Asthma   . BPH (benign prostatic hyperplasia)   . Complication of anesthesia    difficulty waking , they twlight me because of my respiratory problems "  . Depression    on zoloft for depression  . GERD (gastroesophageal reflux disease)   . Headache    botox injections for headaches  . Hyperlipidemia   . Hypertension   . Hypogonadism male   . IBS (irritable bowel syndrome)   . Obesity   . OSA (obstructive sleep apnea)   . Pre-diabetes   . Prostatitis     Patient Active Problem List   Diagnosis Date Noted  . Irritant contact dermatitis due to frequent handwashing 06/08/2016  . Sepsis ([place]) 06/03/2016  . Nausea vomiting and diarrhea 06/03/2016  . Fever 06/03/2016  . Acute asthma exacerbation 05/30/2016  . Acute exacerbation of COPD with asthma ([place]) 05/29/2016  . COPD (chronic obstructive pulmonary disease) ([place]) 05/29/2016  . DM (diabetes mellitus) ([place]) 05/29/2016  . Acute medial meniscal tear, left, subsequent encounter 04/23/2016  . Recurrent infections 03/29/2016  . Chronic nonseasonal allergic rhinitis due to pollen 03/29/2016  . Glucose intolerance (impaired glucose tolerance) 01/16/2016  . Polypharmacy 01/16/2016  . BMI 47.46,   adult ([place]) 03/06/2015  . SDAT 02/05/2015  . Asthma-COPD overlap syndrome ([place]) 02/05/2015  . OSA (obstructive sleep apnea) 02/05/2015  .  Medication management 08/02/2014  . GERD (gastroesophageal reflux disease) 05/09/2014  . Obesity, Class III, BMI 40-49.9 (morbid obesity) ([place]) 02/01/2014  . Vitamin D deficiency 08/01/2013  . Prediabetes 08/01/2013  . 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 Diverticula of colon 05/07/2011  . Hypertension 01/31/2011  . Hyperlipidemia 01/31/2011  . Migraine 01/31/2011  . BPH (benign  prostatic hyperplasia) 01/31/2011  . Testosterone Deficiency 01/31/2011  . IBS (irritable bowel syndrome) 01/31/2011  . Partial complex seizure disorder with intractable epilepsy ([place]) 01/31/2011  . Depression, controlled 01/31/2011  . COPD with asthma ([place]) 01/31/2011    Past Surgical History:  Procedure Laterality Date  . ABDOMINAL SURGERY    . ANKLE FRACTURE SURGERY Right   . CYSTOSCOPY     [place]  . TONSILLECTOMY    . [place] RESECTION  2007  . UVULOPALATOPHARYNGOPLASTY        Home Medications    Prior to Admission medications   Medication Sig Start Date End Date Taking? Authorizing Provider  albuterol (PROAIR HFA) 108 (90 Base) MCG/ACT inhaler INHALE 2 PUFFS INTO THE LUNGS EVERY 4 (FOUR) HOURS AS NEEDED FOR WHEEZING OR SHORTNESS OF BREATH. 08/13/15  Yes [name], MD  albuterol (PROVENTIL) (2.5 MG/3ML) 0.083% nebulizer solution Inhale 2.5 mg into the lungs every 4 (four) hours as needed. 05/27/16  Yes Historical Provider, MD  alfuzosin (UROXATRAL) 10 MG 24 hr tablet Take 10 mg by mouth daily.     Yes Historical Provider, MD  anastrozole (ARIMIDEX) 1 MG tablet Take 1 mg by mouth 2 (two) times daily.  08/08/15  Yes Historical Provider, MD  atenolol (TENORMIN) 100 MG tablet TAKE 1 TABLET BY MOUTH EVERY DAY 09/26/15  Yes [name], MD  baclofen (LIORESAL) 10 MG tablet Take 10 mg by mouth 3 (three) times daily.  03/03/16  Yes Historical Provider, MD  budesonide-formoterol (SYMBICORT) 160-4.5 MCG/ACT inhaler Inhale 2 puffs into the lungs 2 (two) times daily. 04/14/16  Yes [name], MD  Calcium Carbonate-Vitamin D (CALCIUM + D PO) Take 500 mg by mouth 2 (two) times daily.    Yes Historical Provider, MD  cetaphil (CETAPHIL) lotion Apply topically 2 (two) times daily. 06/09/16  Yes Lavina Hamman, MD  cetirizine (ZYRTEC) 10 MG tablet Take 10 mg by mouth daily.   Yes Historical Provider, MD  Cholecalciferol (VITAMIN D PO) Take 10,000-20,000 Units by mouth 2 (two)  times daily. 20000 in the morning and 10000 in the evening   Yes Historical Provider, MD  Cinnamon 500 MG capsule Take 500 mg by mouth 2 (two) times daily.    Yes Historical Provider, MD  desmopressin (DDAVP) 0.2 MG tablet Take 200 mcg by mouth at bedtime. 04/10/14  Yes Historical Provider, MD  donepezil (ARICEPT) 23 MG TABS tablet Take 23 mg by mouth at bedtime.   Yes Historical Provider, MD  DYMISTA 137-50 MCG/ACT SUSP PLACE 2 SPRAYS INTO BOTH NOSTRILS 2 (TWO) TIMES DAILY. 04/10/16  Yes Historical Provider, MD  esomeprazole (NEXIUM) 20 MG capsule Take 20 mg by mouth daily. 04/29/16  Yes Historical Provider, MD  gabapentin (NEURONTIN) 600 MG tablet 1,800 mg 3 (three) times daily.  07/28/13  Yes Historical Provider, MD  guaiFENesin (MUCINEX) 600 MG 12 hr tablet Take 600 mg by mouth 2 (two) times daily.    Yes Historical Provider, MD  l-methylfolate-B6-B12 (METANX) 3-35-2 MG TABS tablet Take 1 tablet by mouth daily. 06/13/16  Yes Historical Provider, MD  memantine (NAMENDA) 10 MG tablet Take 10 mg by mouth 2 (two)  times daily.    Yes Historical Provider, MD  metFORMIN (GLUCOPHAGE) 500 MG tablet Take 500 mg by mouth 2 (two) times daily with a meal.   Yes Historical Provider, MD  Methylfol-Algae-B12-Acetylcyst (CEREFOLIN NAC) 6-90.314-2-600 MG TABS Take 1 tablet by mouth daily.  08/03/12  Yes Historical Provider, MD  Misc Natural Products (TURMERIC CURCUMIN) CAPS Take 1 capsule by mouth 2 (two) times daily.   Yes Historical Provider, MD  montelukast (SINGULAIR) 10 MG tablet Take 1 tablet (10 mg total) by mouth daily. Patient taking differently: Take 10 mg by mouth at bedtime.  08/13/15  Yes [name], MD  Multiple Vitamins-Minerals (MULTIVITAMIN WITH MINERALS) tablet Take 1 tablet by mouth daily.   Yes Historical Provider, MD  Omega-3 Krill Oil 1000 MG CAPS Take 1,000 mg by mouth 2 (two) times daily.   Yes Historical Provider, MD  ondansetron (ZOFRAN ODT) 4 MG disintegrating tablet Take 1 tablet (4 mg  total) by mouth every 6 (six) hours as needed for nausea or vomiting. 06/18/16  Yes Vicie Mutters, PA-C  oxyCODONE-acetaminophen (PERCOCET) 10-325 MG tablet Take 1 tablet by mouth every 4 (four) hours as needed for pain.   Yes Historical Provider, MD  pentosan polysulfate (ELMIRON) 100 MG capsule Take 200 mg by mouth 3 (three) times daily before meals.    Yes Historical Provider, MD  Phosphatidylserine-DHA-EPA (VAYACOG PO) Take 1 capsule by mouth 2 (two) times daily. 06/02/16  Yes Historical Provider, MD  POTASSIUM PO Take 1 tablet by mouth daily.   Yes Historical Provider, MD  pravastatin (PRAVACHOL) 40 MG tablet TAKE 1 TABLET BY MOUTH AT BEDTIME 07/17/15  Yes [name], MD  sertraline (ZOLOFT) 50 MG tablet Take 50 mg by mouth 2 (two) times daily.    Yes Historical Provider, MD  tadalafil (CIALIS) 5 MG tablet Take 5 mg by mouth every evening.    Yes Historical Provider, MD  triamcinolone cream (KENALOG) 0.1 % APPLY TOPICALLY 4 TIMES A DAY Patient taking differently: APPLY TOPICALLY 2 TIMES A DAY 06/09/16  Yes Courtney Forcucci, PA-C  trospium (SANCTURA) 20 MG tablet 20 mg 2 (two) times daily.  06/22/15  Yes Historical Provider, MD  zinc gluconate 50 MG tablet Take 50 mg by mouth daily.   Yes Historical Provider, MD    Family History Family History  Problem Relation Age of Onset  . Diabetes Paternal Uncle   . Cancer Father     lymphoma, colon  . Diabetes Maternal Grandmother   . Heart disease Maternal Grandfather   . Diabetes Maternal Grandfather   . Diabetes Paternal Grandmother   . Diabetes Paternal Grandfather   . Dementia Mother   . Prostate cancer Maternal Uncle   . Lung disease Neg Hx   . Rheumatologic disease Neg Hx     Social History Social History  Substance Use Topics  . Smoking status: Former Smoker    Packs/day: 0.10    Years: 15.00    Start date: 12/14/1980    Quit date: 05/27/1995  . Smokeless tobacco: Never Used     Comment: significant second-hand exposure  through mother  . Alcohol use Yes     Comment: rarely     Allergies   Bee venom; Ppd [tuberculin purified protein derivative]; Fenofibrate; Levofloxacin; Other; Verapamil; Claritin [loratadine]; and Cymbalta [duloxetine hcl]   Review of Systems Review of Systems  Constitutional: Positive for chills, fatigue and fever.  Cardiovascular: Positive for chest pain.  Gastrointestinal: Positive for nausea. Negative for diarrhea and vomiting.  Neurological: Positive for light-headedness.  All other systems reviewed and are negative.    Physical Exam Updated Vital Signs BP 112/83 (BP Location: Left Arm)   Pulse 85   Temp 99.3 F (37.4 C) (Oral)   Resp 18   Ht 6\' 1"  (1.854 m)   Wt (!) 370 lb (167.8 kg)   SpO2 99%   BMI 48.82 kg/m   Physical Exam  Constitutional: He is oriented to person, place, and time. He appears well-developed and well-nourished.  HENT:  Head: Normocephalic.  Eyes: Conjunctivae are normal.  Cardiovascular: Normal rate, regular rhythm, normal heart sounds and intact distal pulses.   No murmur heard. Bilateral radial pulses 2+  Pulmonary/Chest: Effort normal.  Abdominal: He exhibits no distension.  Musculoskeletal: Normal range of motion.  1+ pitting edema bilateral lower extremities.   Neurological: He is alert and oriented to person, place, and time.  Skin: Skin is warm and dry.  Psychiatric: He has a normal mood and affect.  Nursing note and vitals reviewed.    ED Treatments / Results  DIAGNOSTIC STUDIES:  Oxygen Saturation is 99% on RA, normal by my interpretation.    COORDINATION OF CARE:  6:49 PM Discussed treatment plan with pt at bedside including EKG and lab work and pt agreed to plan.  Labs (all labs ordered are listed, but only abnormal results are displayed) Labs Reviewed  CBC WITH DIFFERENTIAL/PLATELET - Abnormal; Notable for the following:       Result Value   WBC 11.2 (*)    All other components within normal limits    URINALYSIS, ROUTINE W REFLEX MICROSCOPIC - Abnormal; Notable for the following:    Color, Urine AMBER (*)    APPearance HAZY (*)    Specific Gravity, Urine 1.033 (*)    Ketones, ur 5 (*)    Protein, ur 30 (*)    All other components within normal limits  I-STAT CHEM 8, ED - Abnormal; Notable for the following:    Glucose, Bld 105 (*)    All other components within normal limits  I-STAT CG4 LACTIC ACID, ED - Abnormal; Notable for the following:    Lactic Acid, Venous 2.03 (*)    All other components within normal limits  I-STAT TROPOININ, ED  I-STAT CG4 LACTIC ACID, ED  I-STAT TROPOININ, ED  I-STAT CG4 LACTIC ACID, ED  I-STAT TROPOININ, ED  Randolm Idol, ED    EKG  EKG Interpretation None       Radiology Dg Chest 2 View  Result Date: 07/16/2016 CLINICAL DATA:  Low blood pressure EXAM: CHEST  2 VIEW COMPARISON:  06/03/2016 FINDINGS: Mild cardiomegaly without overt failure. No consolidation or effusion. No pneumothorax. Minimal anterior wedging of mid thoracic vertebra as before. IMPRESSION: Mild cardiomegaly without overt failure. No acute infiltrate or edema. Electronically Signed   By: Donavan Foil M.D.   On: 07/16/2016 19:28    Procedures Procedures (including critical care time)  Medications Ordered in ED Medications  sodium chloride 0.9 % bolus 1,000 mL (0 mLs Intravenous Stopped 07/16/16 2235)     Initial Impression / Assessment and Plan / ED Course  I have reviewed the triage vital signs and the nursing notes.  Pertinent labs & imaging results that were available during my care of the patient were reviewed by me and considered in my medical decision making (see chart for details).     EKG wo evidence of ACS No CP/SOB, evidence of PE by history or exam - first trop negative,  Wells of 0 UA with evidence of dehydration, LA slightly elevated - likely from diarrhea and not eating much - recommended increase fluids CXR shows cardiomegaly but no evidence of  CHF exacerbation and pt states this is known Doubt sepsis, CXR wo PNA, no infectious sx Pt has been on many steroids recently, possible adrenal suppression but did appear to respond to fluids Will have pt fu with pcp tomorrow, increase fluids over next 24h, and hold atenolol Pt verbalized agreement and understands he must return to ed for onset of lightheadedness, syncope, sob/cp or other concerning sx  Final Clinical Impressions(s) / ED Diagnoses   Final diagnoses:  Hypotension, unspecified hypotension type  Dehydration    New Prescriptions Discharge Medication List as of 07/16/2016 10:55 PM      I personally performed the services described in this documentation, which was scribed in my presence. The recorded information has been reviewed and is accurate.    Karma Greaser, MD 07/17/16 0120    Varney Biles, MD 07/17/16 [phone]

## 2016-07-17 ENCOUNTER — Other Ambulatory Visit: Payer: Self-pay | Admitting: Internal Medicine

## 2016-07-18 ENCOUNTER — Encounter: Payer: Self-pay | Admitting: Pulmonary Disease

## 2016-07-18 ENCOUNTER — Ambulatory Visit (INDEPENDENT_AMBULATORY_CARE_PROVIDER_SITE_OTHER): Payer: BLUE CROSS/BLUE SHIELD | Admitting: Pulmonary Disease

## 2016-07-18 VITALS — BP 128/74 | HR 91 | Ht 73.0 in | Wt 376.2 lb

## 2016-07-18 DIAGNOSIS — K219 Gastro-esophageal reflux disease without esophagitis: Secondary | ICD-10-CM

## 2016-07-18 DIAGNOSIS — J309 Allergic rhinitis, unspecified: Secondary | ICD-10-CM | POA: Diagnosis not present

## 2016-07-18 DIAGNOSIS — G4733 Obstructive sleep apnea (adult) (pediatric): Secondary | ICD-10-CM | POA: Diagnosis not present

## 2016-07-18 DIAGNOSIS — J449 Chronic obstructive pulmonary disease, unspecified: Secondary | ICD-10-CM | POA: Diagnosis not present

## 2016-07-18 NOTE — Progress Notes (Signed)
Subjective:    Patient ID: Mark Harrell, male    DOB: [birth year], 56 y.o.   MRN: [id number]  C.C.:  Follow-up COPD Mixed Type, OSA, GERD, & Chronic Allergic Rhinitis.   HPI COPD mixed type: Previously prescribed Singulair & Symbicort. Patient was treated hospitalization in early November for an acute exacerbation. The patient reports he was also treated as an outpatient with another course of antibiotics and Prednisone ending 1-2 weeks ago. He reports his dyspnea has significantly improved. Denies any recent coughing or wheezing. Reports he is intermittently using his rescue medication.   OSA: Patient underwent split-night sleep study in December 2017 which did show severe obstructive sleep apnea and recommended AutoPap at 10-18 cm H2O pressure. He hasn't been able to start on his CPAP yet.   GERD: Previously prescribed Pepcid. Known small sliding type hiatal hernia. He reports he has transitioned back to Nexium. He reports he is having intermittent nausea and is using Zofran intermittently. He reports only a couple episodes of morning brash water taste.   Chronic allergic rhinitis: Currently followed by Dr. Gillermina Hu allergist. Prescribed diagnosed, Singulair, and Zyrtec. He hasn't yet started on Nucala.   Review of Systems  No chest pain or pressure. He does report some chest wall pain that seems to vary. He denies any chills or sweats recently. He has some mild low grade fever and diaphoresis with "flushing". He reports significant fatigue & malaise.   Allergies  Allergen Reactions  . Bee Venom Swelling  . Ppd [Tuberculin Purified Protein Derivative] Other (See Comments)    +ppd NEG Quantferron Gold 3/13  . Fenofibrate Other (See Comments)    Back pain  . Levofloxacin Diarrhea  . Other Other (See Comments)    Some antibiotics cause diarrhea  . Verapamil Other (See Comments)    Back pain  . Claritin [Loratadine] Other (See Comments)  . Cymbalta [Duloxetine Hcl] Other (See  Comments)    unknown    Current Outpatient Prescriptions on File Prior to Visit  Medication Sig Dispense Refill  . albuterol (PROAIR HFA) 108 (90 Base) MCG/ACT inhaler INHALE 2 PUFFS INTO THE LUNGS EVERY 4 (FOUR) HOURS AS NEEDED FOR WHEEZING OR SHORTNESS OF BREATH. 8.5 each 3  . albuterol (PROVENTIL) (2.5 MG/3ML) 0.083% nebulizer solution Inhale 2.5 mg into the lungs every 4 (four) hours as needed.  12  . alfuzosin (UROXATRAL) 10 MG 24 hr tablet Take 10 mg by mouth daily.      [name] anastrozole (ARIMIDEX) 1 MG tablet Take 1 mg by mouth 2 (two) times daily.   3  . atenolol (TENORMIN) 100 MG tablet TAKE 1 TABLET BY MOUTH EVERY DAY 90 tablet 3  . baclofen (LIORESAL) 10 MG tablet Take 10 mg by mouth 3 (three) times daily.     . budesonide-formoterol (SYMBICORT) 160-4.5 MCG/ACT inhaler Inhale 2 puffs into the lungs 2 (two) times daily. 1 Inhaler 5  . Calcium Carbonate-Vitamin D (CALCIUM + D PO) Take 500 mg by mouth 2 (two) times daily.     . cetaphil (CETAPHIL) lotion Apply topically 2 (two) times daily. 236 mL 0  . cetirizine (ZYRTEC) 10 MG tablet Take 10 mg by mouth daily.    . Cholecalciferol (VITAMIN D PO) Take 10,000-20,000 Units by mouth 2 (two) times daily. 20000 in the morning and 10000 in the evening    . Cinnamon 500 MG capsule Take 500 mg by mouth 2 (two) times daily.     [name] desmopressin (DDAVP) 0.2 MG tablet Take  200 mcg by mouth at bedtime.  3  . donepezil (ARICEPT) 23 MG TABS tablet Take 23 mg by mouth at bedtime.    . DYMISTA 137-50 MCG/ACT SUSP PLACE 2 SPRAYS INTO BOTH NOSTRILS 2 (TWO) TIMES DAILY.  5  . esomeprazole (NEXIUM) 20 MG capsule Take 20 mg by mouth daily.  1  . gabapentin (NEURONTIN) 600 MG tablet 1,800 mg 3 (three) times daily.     [name] guaiFENesin (MUCINEX) 600 MG 12 hr tablet Take 600 mg by mouth 2 (two) times daily.     [name] l-methylfolate-B6-B12 (METANX) 3-35-2 MG TABS tablet Take 1 tablet by mouth daily.  3  . memantine (NAMENDA) 10 MG tablet Take 10 mg by mouth 2 (two)  times daily.     . metFORMIN (GLUCOPHAGE) 500 MG tablet Take 500 mg by mouth 2 (two) times daily with a meal.    . Methylfol-Algae-B12-Acetylcyst (CEREFOLIN NAC) 6-90.314-2-600 MG TABS Take 1 tablet by mouth daily.     . Misc Natural Products (TURMERIC CURCUMIN) CAPS Take 1 capsule by mouth 2 (two) times daily.    . montelukast (SINGULAIR) 10 MG tablet Take 1 tablet (10 mg total) by mouth daily. (Patient taking differently: Take 10 mg by mouth at bedtime. ) 90 tablet 3  . Multiple Vitamins-Minerals (MULTIVITAMIN WITH MINERALS) tablet Take 1 tablet by mouth daily.    . Omega-3 Krill Oil 1000 MG CAPS Take 1,000 mg by mouth 2 (two) times daily.    . ondansetron (ZOFRAN ODT) 4 MG disintegrating tablet Take 1 tablet (4 mg total) by mouth every 6 (six) hours as needed for nausea or vomiting. 30 tablet 0  . oxyCODONE-acetaminophen (PERCOCET) 10-325 MG tablet Take 1 tablet by mouth every 4 (four) hours as needed for pain.    . pentosan polysulfate (ELMIRON) 100 MG capsule Take 200 mg by mouth 3 (three) times daily before meals.     . Phosphatidylserine-DHA-EPA (VAYACOG PO) Take 1 capsule by mouth 2 (two) times daily.    [name] POTASSIUM PO Take 1 tablet by mouth daily.    . pravastatin (PRAVACHOL) 40 MG tablet TAKE 1 TABLET BY MOUTH AT BEDTIME 90 tablet 2  . sertraline (ZOLOFT) 50 MG tablet Take 50 mg by mouth 2 (two) times daily.     . tadalafil (CIALIS) 5 MG tablet Take 5 mg by mouth every evening.     . triamcinolone cream (KENALOG) 0.1 % APPLY TOPICALLY 4 TIMES A DAY (Patient taking differently: APPLY TOPICALLY 2 TIMES A DAY) 30 g 1  . trospium (SANCTURA) 20 MG tablet 20 mg 2 (two) times daily.   2  . zinc gluconate 50 MG tablet Take 50 mg by mouth daily.     No current facility-administered medications on file prior to visit.     Past Medical History:  Diagnosis Date  . Anesthesia complication requiring reversal agent administration    ? from central apnea, very difficult to get off vent  .  Arthritis    osteo  . Asthma   . BPH (benign prostatic hyperplasia)   . Complication of anesthesia    difficulty waking , they twlight me because of my respiratory problems "  . Depression    on zoloft for depression  . GERD (gastroesophageal reflux disease)   . Headache    botox injections for headaches  . Hyperlipidemia   . Hypertension   . Hypogonadism male   . IBS (irritable bowel syndrome)   . Obesity   . OSA (obstructive  sleep apnea)   . Pre-diabetes   . Prostatitis     Past Surgical History:  Procedure Laterality Date  . ABDOMINAL SURGERY    . ANKLE FRACTURE SURGERY Right   . CYSTOSCOPY     [place]  . TONSILLECTOMY    . [place] RESECTION  2007  . UVULOPALATOPHARYNGOPLASTY      Family History  Problem Relation Age of Onset  . Diabetes Paternal Uncle   . Cancer Father     lymphoma, colon  . Diabetes Maternal Grandmother   . Heart disease Maternal Grandfather   . Diabetes Maternal Grandfather   . Diabetes Paternal Grandmother   . Diabetes Paternal Grandfather   . Dementia Mother   . Prostate cancer Maternal Uncle   . Lung disease Neg Hx   . Rheumatologic disease Neg Hx     Social History   Social History  . Marital status: Single    Spouse name: N/A  . Number of children: N/A  . Years of education: N/A   Occupational History  . Dance movement psychotherapist    Social History Main Topics  . Smoking status: Former Smoker    Packs/day: 0.10    Years: 15.00    Start date: 12/14/1980    Quit date: 05/27/1995  . Smokeless tobacco: Never Used     Comment: significant second-hand exposure through mother  . Alcohol use Yes     Comment: rarely  . Drug use: No  . Sexual activity: No   Other Topics Concern  . None   Social History Narrative   [facility]:   Originally from [place]. Previously has lived in [place]. He has lived in [place], [place], & [place]. He has worked in Engineer, production. No pets currently. Brief exposure to a roommates  bird Secretary/administrator) in college. No mold, asbestos, or hot tub exposure.       Objective:   Physical Exam BP 128/74 (BP Location: Left Arm, Patient Position: Sitting, Cuff Size: Normal)   Pulse 91   Ht 6\' 1"  (1.854 m)   Wt (!) 376 lb 3.2 oz (170.6 kg)   SpO2 95%   BMI 49.63 kg/m  Gen.: Super morbid obesity. No distress. Appears comfortable. Integument: Warm and dry. No rash or bruising on exposed skin. Pulmonary: Good aeration & clear bilaterally to auscultation. No accessory muscle use on room air. Cardiovascular: Regular rate. Normal S1 & S2. Trace edema. HEENT: Minimal nasal turbinate swelling. No oral ulcers. Moist mucous membranes. Abdomen: Soft. Protuberant. Normal bowel sounds. Extremities: No cyanosis or clubbing.  PFT 04/21/16: FVC 3.47 L (66%) FEV1 2.58 L (64%) FEV1/FVC 0.74 FEF 25-75 1.95 L (57%) negative bronchodilator response TLC 6.21 L (84%) RV 96% ERV 15% DLCO uncorrected 95% 03/28/16: FVC 4.03 L (80%) FEV1 2.80 L (69%) FEV1/FVC 0.69 FEF 25-75 1.62 L (40%) negative bronchodilator response  6MWT 05/08/16:  Walked 321 meters / Baseline Sat 93% on RA / Nadir Sat 93% on RA @ rest  SPLIT NIGHT SLEEP STUDY (05/07/16): Severe obstructive sleep apnea with AHI 65.7 events/hour. No significant central sleep apnea occurred. Severe desaturation during diagnostic portion to 72% on room air. No cardiac abnormalities or periodic limb movements were noted. Optimal CPAP pressure was 18 cm H2O with a large-sized [employer]kel Full Face Mask Simplus mask. Reading physician recommended AutoPap at 10-18 cm H2O pressure.  IMAGING ESOPHAGRAM/BARIUM SWALLOW 04/15/16 (per radiologist): Mild nonspecific esophageal dysmotility disorder. Episode of flash laryngeal penetration with swallowing. Small sliding type I hiatal hernia. No  visible mass, erosion, or ulcerations.  CXR PA/LAT 02/13/16 (previously reviewed by me): No focal opacity or effusion appreciated. No mass appreciated. Heart borderline  normal in size. Mediastinum normal in contour. Low lung volumes.  LABS 04/10/16 Alpha-1 antitrypsin: MM (135)  03/28/16 CBC: 11.8/14.2/43.0/309 Eosinophils:  354 IgG: 583 IgA: 154 IgM: 46 IgE: 92  11/02/13 ANA:  Negative DS DNA Ab:  <1 SSA:  <1.0 SSB:  <1.0  02/24/11 ACE:  35 RF:  <10    Assessment & Plan:  56 y.o. male with COPD mixed type, OSA, GERD, and chronic allergic rhinitis. Patient has known history of significant secondhand smoke exposure. Patient seems to be recovering from his recent and repetitive acute illnesses. Overall his COPD/asthma overlap syndrome seems to have stabilized. I reviewed his split night sleep study results from December which did show severe obstructive sleep apnea and we discussed the need to initiate treatment as soon as possible to ensure that the patient does not suffer the consequences of long-term untreated sleep apnea given his age. Overall his reflux seems to be reasonably well controlled as he is recovering from his recent gastrointestinal illness. I did caution the patient against excessive NSAID use in the setting of a gastroenteritis as this can worsen some of his symptoms. I instructed the patient contact my office if he had any new breathing problems or questions before his next appointment.  1. COPD mixed type:  Continuing patient on Singulair & Symbicort without any change.  2. OSA:  Patient encouraged to get his CPAP and start using it given the severity of his sleep apnea. 3. GERD:  Continuing on Zofran & Nexium as prescribed. 4. Chronic allergic rhinitis:  Continuing on his current regimen prescribed by his allergist Dr. [name].  5. Health maintenance: Is post influenza vaccine October 2017, Pneumovax November 2017, [place] June 2016, & Tdap Harrell 2015. 6. Follow-up: Return to clinic in 2 months or sooner if needed.   Sonia Baller Ashok Cordia, M.D. [facility] Pager:  [phone] After 3pm or if no response,  call [phone] 4:05 PM 07/18/16

## 2016-07-18 NOTE — Patient Instructions (Signed)
   Please make sure you start using your CPAP as soon as possible.  Let me know if you have any problems with your mask or device.  We are keeping all of your breathing medicines the same.  I will see you back in 2 months or sooner if needed.

## 2016-07-21 DIAGNOSIS — B999 Unspecified infectious disease: Secondary | ICD-10-CM | POA: Diagnosis not present

## 2016-07-22 LAB — IGG, IGA, IGM
IGA: 187 mg/dL (ref 81–463)
IGG (IMMUNOGLOBIN G), SERUM: 586 mg/dL — AB (ref 694–1618)
IGM, SERUM: 78 mg/dL (ref 48–271)

## 2016-07-24 ENCOUNTER — Other Ambulatory Visit: Payer: Self-pay

## 2016-07-24 ENCOUNTER — Encounter: Payer: Self-pay | Admitting: Physician Assistant

## 2016-07-24 ENCOUNTER — Ambulatory Visit (INDEPENDENT_AMBULATORY_CARE_PROVIDER_SITE_OTHER): Payer: BLUE CROSS/BLUE SHIELD | Admitting: Physician Assistant

## 2016-07-24 VITALS — BP 126/76 | HR 82 | Temp 97.5°F | Resp 16 | Ht 73.0 in | Wt 371.6 lb

## 2016-07-24 DIAGNOSIS — E785 Hyperlipidemia, unspecified: Secondary | ICD-10-CM | POA: Diagnosis not present

## 2016-07-24 DIAGNOSIS — E66813 Obesity, class 3: Secondary | ICD-10-CM

## 2016-07-24 DIAGNOSIS — R197 Diarrhea, unspecified: Secondary | ICD-10-CM

## 2016-07-24 DIAGNOSIS — E119 Type 2 diabetes mellitus without complications: Secondary | ICD-10-CM

## 2016-07-24 DIAGNOSIS — R0600 Dyspnea, unspecified: Secondary | ICD-10-CM | POA: Diagnosis not present

## 2016-07-24 DIAGNOSIS — J449 Chronic obstructive pulmonary disease, unspecified: Secondary | ICD-10-CM | POA: Diagnosis not present

## 2016-07-24 DIAGNOSIS — A09 Infectious gastroenteritis and colitis, unspecified: Secondary | ICD-10-CM | POA: Diagnosis not present

## 2016-07-24 DIAGNOSIS — I10 Essential (primary) hypertension: Secondary | ICD-10-CM

## 2016-07-24 DIAGNOSIS — R350 Frequency of micturition: Secondary | ICD-10-CM

## 2016-07-24 DIAGNOSIS — F0390 Unspecified dementia without behavioral disturbance: Secondary | ICD-10-CM

## 2016-07-24 DIAGNOSIS — E559 Vitamin D deficiency, unspecified: Secondary | ICD-10-CM

## 2016-07-24 DIAGNOSIS — Z79899 Other long term (current) drug therapy: Secondary | ICD-10-CM

## 2016-07-24 DIAGNOSIS — R7303 Prediabetes: Secondary | ICD-10-CM

## 2016-07-24 LAB — URINALYSIS, ROUTINE W REFLEX MICROSCOPIC
Bilirubin Urine: NEGATIVE
Glucose, UA: NEGATIVE
Hgb urine dipstick: NEGATIVE
LEUKOCYTES UA: NEGATIVE
NITRITE: NEGATIVE
SPECIFIC GRAVITY, URINE: 1.028 (ref 1.001–1.035)
pH: 5 (ref 5.0–8.0)

## 2016-07-24 LAB — CBC WITH DIFFERENTIAL/PLATELET
BASOS ABS: 0 {cells}/uL (ref 0–200)
Basophils Relative: 0 %
EOS PCT: 2 %
Eosinophils Absolute: 194 cells/uL (ref 15–500)
HCT: 47.5 % (ref 38.5–50.0)
HEMOGLOBIN: 15.6 g/dL (ref 13.2–17.1)
LYMPHS ABS: 2716 {cells}/uL (ref 850–3900)
Lymphocytes Relative: 28 %
MCH: 29.3 pg (ref 27.0–33.0)
MCHC: 32.8 g/dL (ref 32.0–36.0)
MCV: 89.1 fL (ref 80.0–100.0)
MONOS PCT: 12 %
MPV: 10.4 fL (ref 7.5–12.5)
Monocytes Absolute: 1164 cells/uL — ABNORMAL HIGH (ref 200–950)
NEUTROS ABS: 5626 {cells}/uL (ref 1500–7800)
NEUTROS PCT: 58 %
PLATELETS: 268 10*3/uL (ref 140–400)
RBC: 5.33 MIL/uL (ref 4.20–5.80)
RDW: 14.1 % (ref 11.0–15.0)
WBC: 9.7 10*3/uL (ref 3.8–10.8)

## 2016-07-24 LAB — HEPATIC FUNCTION PANEL
ALT: 31 U/L (ref 9–46)
AST: 20 U/L (ref 10–35)
Albumin: 3.5 g/dL — ABNORMAL LOW (ref 3.6–5.1)
Alkaline Phosphatase: 55 U/L (ref 40–115)
BILIRUBIN DIRECT: 0.2 mg/dL (ref <=0.2)
BILIRUBIN INDIRECT: 0.8 mg/dL (ref 0.2–1.2)
BILIRUBIN TOTAL: 1 mg/dL (ref 0.2–1.2)
Total Protein: 5.9 g/dL — ABNORMAL LOW (ref 6.1–8.1)

## 2016-07-24 LAB — LIPID PANEL
CHOL/HDL RATIO: 5.7 ratio — AB (ref <5.0)
CHOLESTEROL: 149 mg/dL (ref <200)
HDL: 26 mg/dL — AB (ref >40)
LDL Cholesterol: 71 mg/dL (ref <100)
TRIGLYCERIDES: 261 mg/dL — AB (ref <150)
VLDL: 52 mg/dL — AB (ref <30)

## 2016-07-24 LAB — BASIC METABOLIC PANEL WITH GFR
BUN: 15 mg/dL (ref 7–25)
CALCIUM: 9.4 mg/dL (ref 8.6–10.3)
CO2: 28 mmol/L (ref 20–31)
CREATININE: 0.91 mg/dL (ref 0.70–1.33)
Chloride: 104 mmol/L (ref 98–110)
Glucose, Bld: 95 mg/dL (ref 65–99)
Potassium: 4.5 mmol/L (ref 3.5–5.3)
SODIUM: 141 mmol/L (ref 135–146)

## 2016-07-24 LAB — HEMOGLOBIN A1C
Hgb A1c MFr Bld: 5.7 % — ABNORMAL HIGH (ref <5.7)
MEAN PLASMA GLUCOSE: 117 mg/dL

## 2016-07-24 LAB — CD57, CD3, CD8, FLOW CYTOMETRY
CD57+/CD3-/CD8-ABSOLUTE: 59 {cells}/uL (ref 20–114)
CD57+/CD3-/CD8-OF % WBC: 1 % (ref 1–3)
CD57+/CD3-/CD8-of % lymphs: 2 % (ref 1–5)
CD57+/CD3-OF % LYMPHS: 3 % (ref 1–10)
CD57+/CD3-absolute: 89 cells/uL (ref 20–258)
CD57+/CD3-of % WBC: 1 % (ref 1–4)
CD57+/CD8-absolute: 119 cells/uL (ref 20–248)
CD57+/CD8-of % WBC: 1 % (ref 1–4)
CD57+/CD8-of % lymphs: 4 % (ref 1–15)

## 2016-07-24 LAB — TSH: TSH: 0.73 m[IU]/L (ref 0.40–4.50)

## 2016-07-24 MED ORDER — ONDANSETRON 4 MG PO TBDP: 4.0000 mg | ORAL_TABLET | Freq: Four times a day (QID) | ORAL | 0 refills | Status: DC | PRN

## 2016-07-24 MED ORDER — BISOPROLOL-HYDROCHLOROTHIAZIDE 5-6.25 MG PO TABS: 1.0000 | ORAL_TABLET | Freq: Every day | ORAL | 3 refills | Status: DC

## 2016-07-24 NOTE — Patient Instructions (Addendum)
GET THE CPAP Stop the atenolol and start the ziac 5mg , monitor bp Stop the arimidex/anastrozole  Continue nexium  Stop the metformin for now  Follow up GI  I think it is possible that you have sleep apnea. It can cause interrupted sleep, headaches, frequent awakenings, fatigue, dry mouth, fast/slow heart beats, memory issues, anxiety/depression, swelling, numbness tingling hands/feet, weight gain, shortness of breath, and the list goes on. Sleep apnea needs to be ruled out because if it is left untreated it does eventually lead to abnormal heart beats, lung failure or heart failure as well as increasing the risk of heart attack and stroke. There are masks you can wear OR a mouth piece that I can give you information about. Often times though people feel MUCH better after getting treatment.   Sleep Apnea  Sleep apnea is a sleep disorder characterized by abnormal pauses in breathing while you sleep. When your breathing pauses, the level of oxygen in your blood decreases. This causes you to move out of deep sleep and into light sleep. As a result, your quality of sleep is poor, and the system that carries your blood throughout your body (cardiovascular system) experiences stress. If sleep apnea remains untreated, the following conditions can develop:  High blood pressure (hypertension).  Coronary artery disease.  Inability to achieve or maintain an erection (impotence).  Impairment of your thought process (cognitive dysfunction). There are three types of sleep apnea: 1. Obstructive sleep apnea--Pauses in breathing during sleep because of a blocked airway. 2. Central sleep apnea--Pauses in breathing during sleep because the area of the brain that controls your breathing does not send the correct signals to the muscles that control breathing. 3. Mixed sleep apnea--A combination of both obstructive and central sleep apnea.  RISK FACTORS The following risk factors can increase your risk of  developing sleep apnea:  Being overweight.  Smoking.  Having narrow passages in your nose and throat.  Being of older age.  Being male.  Alcohol use.  Sedative and tranquilizer use.  Ethnicity. Among individuals younger than 35 years, African Americans are at increased risk of sleep apnea. SYMPTOMS   Difficulty staying asleep.  Daytime sleepiness and fatigue.  Loss of energy.  Irritability.  Loud, heavy snoring.  Morning headaches.  Trouble concentrating.  Forgetfulness.  Decreased interest in sex. DIAGNOSIS  In order to diagnose sleep apnea, your caregiver will perform a physical examination. Your caregiver may suggest that you take a home sleep test. Your caregiver may also recommend that you spend the night in a sleep lab. In the sleep lab, several monitors record information about your heart, lungs, and brain while you sleep. Your leg and arm movements and blood oxygen level are also recorded. TREATMENT The following actions may help to resolve mild sleep apnea:  Sleeping on your side.   Using a decongestant if you have nasal congestion.   Avoiding the use of depressants, including alcohol, sedatives, and narcotics.   Losing weight and modifying your diet if you are overweight. There also are devices and treatments to help open your airway:  Oral appliances. These are custom-made mouthpieces that shift your lower jaw forward and slightly open your bite. This opens your airway.  Devices that create positive airway pressure. This positive pressure "splints" your airway open to help you breathe better during sleep. The following devices create positive airway pressure:  Continuous positive airway pressure (CPAP) device. The CPAP device creates a continuous level of air pressure with an air  pump. The air is delivered to your airway through a mask while you sleep. This continuous pressure keeps your airway open.  Nasal expiratory positive airway pressure  (EPAP) device. The EPAP device creates positive air pressure as you exhale. The device consists of single-use valves, which are inserted into each nostril and held in place by adhesive. The valves create very little resistance when you inhale but create much more resistance when you exhale. That increased resistance creates the positive airway pressure. This positive pressure while you exhale keeps your airway open, making it easier to breath when you inhale again.  Bilevel positive airway pressure (BPAP) device. The BPAP device is used mainly in patients with central sleep apnea. This device is similar to the CPAP device because it also uses an air pump to deliver continuous air pressure through a mask. However, with the BPAP machine, the pressure is set at two different levels. The pressure when you exhale is lower than the pressure when you inhale.  Surgery. Typically, surgery is only done if you cannot comply with less invasive treatments or if the less invasive treatments do not improve your condition. Surgery involves removing excess tissue in your airway to create a wider passage way. Document Released: 05/02/2002 Document Revised: 09/06/2012 Document Reviewed: 09/18/2011 [facility] Patient Information 2015 [place], [place]. This information is not intended to replace advice given to you by your health care provider. Make sure you discuss any questions you have with your health care provider.       \

## 2016-07-24 NOTE — Progress Notes (Signed)
Patient ID: Mark Harrell, male   DOB: 12-Oct-1960, 56 y.o.   MRN: [id number]  Assessment and Plan:  Hypertension:  -Continue medication,  -monitor blood pressure at home.  -Continue DASH diet.   -Reminder to go to the ER if any CP, SOB, nausea, dizziness, severe HA, changes vision/speech, left arm numbness and tingling, and jaw pain.  Cholesterol: -Continue diet and exercise.  -Check cholesterol.   Pre-diabetes: -Continue diet and exercise.  -Check A1C  Vitamin D Def: -check level -continue medications.   COPD mixed type ([place]) Will switch to more selective BB, stop atenolol go to ziac Follow up pulmonary Weight loss advised  NEEDS CPAP  Obesity, Class III, BMI 40-49.9 (morbid obesity) (HCC) Weight loss advised, discussed that majority of symptoms would resolve with weight loss  Polypharmacy -     Zinc- has been taking 50-100 mg for years, STOP, and check level  Dyspnea, unspecified type -     D-dimer, quantitative (not at [facility])- will check due to obesity and arimidex use puts at greater risk may need CTA -     bisoprolol-hydrochlorothiazide (ZIAC) 5-6.25 MG tablet; Take 1 tablet by mouth daily. Will switch to more selective BB, stop atenolol go to ziac Follow up pulmonary Weight loss advised  NEEDS CPAP  Urinary frequency -     Urinalysis, Routine w reflex microscopic -     Urine culture  Diarrhea of presumed infectious origin -     Zinc- STOP ORAL ZINC -     ondansetron (ZOFRAN ODT) 4 MG disintegrating tablet; Take 1 tablet (4 mg total) by mouth every 6 (six) hours as needed for nausea or vomiting. - STOP METFORMIN for now, A1C was good last time   Continue diet and meds as discussed. Further disposition pending results of labs.  HPI 56 y.o. male  presents for 3 month follow up with hypertension, hyperlipidemia, prediabetes and vitamin D.   His blood pressure has been controlled at home, today their BP is BP: 126/76.   He does workout. He denies chest pain,  shortness of breath, dizziness.  He does not do much activity Has been having repeated infections and may start on IVIG therapy. He has SOB with any activity, SOB/cough, continues to have fatigue, mentally foggy, NOT ON CPAP, and has polypharmacy. He is on amimedex, has been having frequent urination.   He is on cholesterol medication and denies myalgias. His cholesterol is at goal. The cholesterol last visit was:   Lab Results  Component Value Date   CHOL 182 04/23/2016   HDL 26 (L) 04/23/2016   LDLCALC NOT CALC 04/23/2016   TRIG 538 (H) 04/23/2016   CHOLHDL 7.0 (H) 04/23/2016    He has been working on diet and exercise for prediabetes, and denies foot ulcerations, hyperglycemia, hypoglycemia , increased appetite, nausea, paresthesia of the feet, polydipsia, polyuria, visual disturbances, vomiting and weight loss. Last A1C in the office was:  Lab Results  Component Value Date   HGBA1C 5.5 04/23/2016   Patient is on Vitamin D supplement.  Lab Results  Component Value Date   VD25OH 79 04/23/2016     BMI is Body mass index is 49.03 kg/m., he is working on diet and exercise. Wt Readings from Last 3 Encounters:  07/24/16 (!) 371 lb 9.6 oz (168.6 kg)  07/18/16 (!) 376 lb 3.2 oz (170.6 kg)  07/16/16 (!) 370 lb (167.8 kg)     Current Medications:  Current Outpatient Prescriptions on File Prior to Visit  Medication Sig Dispense Refill  . albuterol (PROAIR HFA) 108 (90 Base) MCG/ACT inhaler INHALE 2 PUFFS INTO THE LUNGS EVERY 4 (FOUR) HOURS AS NEEDED FOR WHEEZING OR SHORTNESS OF BREATH. 8.5 each 3  . albuterol (PROVENTIL) (2.5 MG/3ML) 0.083% nebulizer solution Inhale 2.5 mg into the lungs every 4 (four) hours as needed.  12  . alfuzosin (UROXATRAL) 10 MG 24 hr tablet Take 10 mg by mouth daily.      . baclofen (LIORESAL) 10 MG tablet Take 10 mg by mouth 3 (three) times daily.     . budesonide-formoterol (SYMBICORT) 160-4.5 MCG/ACT inhaler Inhale 2 puffs into the lungs 2 (two) times daily.  1 Inhaler 5  . Calcium Carbonate-Vitamin D (CALCIUM + D PO) Take 500 mg by mouth 2 (two) times daily.     . cetaphil (CETAPHIL) lotion Apply topically 2 (two) times daily. 236 mL 0  . cetirizine (ZYRTEC) 10 MG tablet Take 10 mg by mouth daily.    . Cholecalciferol (VITAMIN D PO) Take 10,000-20,000 Units by mouth 2 (two) times daily. 20000 in the morning and 10000 in the evening    . Cinnamon 500 MG capsule Take 500 mg by mouth 2 (two) times daily.     [name] desmopressin (DDAVP) 0.2 MG tablet Take 200 mcg by mouth at bedtime.  3  . donepezil (ARICEPT) 23 MG TABS tablet Take 23 mg by mouth at bedtime.    . DYMISTA 137-50 MCG/ACT SUSP PLACE 2 SPRAYS INTO BOTH NOSTRILS 2 (TWO) TIMES DAILY.  5  . esomeprazole (NEXIUM) 20 MG capsule Take 20 mg by mouth daily.  1  . gabapentin (NEURONTIN) 600 MG tablet 1,800 mg 3 (three) times daily.     [name] guaiFENesin (MUCINEX) 600 MG 12 hr tablet Take 600 mg by mouth 2 (two) times daily.     [name] l-methylfolate-B6-B12 (METANX) 3-35-2 MG TABS tablet Take 1 tablet by mouth daily.  3  . memantine (NAMENDA) 10 MG tablet Take 10 mg by mouth 2 (two) times daily.     . metFORMIN (GLUCOPHAGE) 500 MG tablet Take 500 mg by mouth 2 (two) times daily with a meal.    . Methylfol-Algae-B12-Acetylcyst (CEREFOLIN NAC) 6-90.314-2-600 MG TABS Take 1 tablet by mouth daily.     . Misc Natural Products (TURMERIC CURCUMIN) CAPS Take 1 capsule by mouth 2 (two) times daily.    . montelukast (SINGULAIR) 10 MG tablet Take 1 tablet (10 mg total) by mouth daily. (Patient taking differently: Take 10 mg by mouth at bedtime. ) 90 tablet 3  . Multiple Vitamins-Minerals (MULTIVITAMIN WITH MINERALS) tablet Take 1 tablet by mouth daily.    . Omega-3 Krill Oil 1000 MG CAPS Take 1,000 mg by mouth 2 (two) times daily.    [name] oxyCODONE-acetaminophen (PERCOCET) 10-325 MG tablet Take 1 tablet by mouth every 4 (four) hours as needed for pain.    . pentosan polysulfate (ELMIRON) 100 MG capsule Take 200 mg by mouth 3  (three) times daily before meals.     . Phosphatidylserine-DHA-EPA (VAYACOG PO) Take 1 capsule by mouth 2 (two) times daily.    [name] POTASSIUM PO Take 1 tablet by mouth daily.    . pravastatin (PRAVACHOL) 40 MG tablet TAKE 1 TABLET BY MOUTH AT BEDTIME 90 tablet 2  . sertraline (ZOLOFT) 50 MG tablet Take 50 mg by mouth 2 (two) times daily.     . tadalafil (CIALIS) 5 MG tablet Take 5 mg by mouth every evening.     . triamcinolone  cream (KENALOG) 0.1 % APPLY TOPICALLY 4 TIMES A DAY (Patient taking differently: APPLY TOPICALLY 2 TIMES A DAY) 30 g 1  . trospium (SANCTURA) 20 MG tablet 20 mg 2 (two) times daily.   2   No current facility-administered medications on file prior to visit.     Medical History:  Past Medical History:  Diagnosis Date  . Anesthesia complication requiring reversal agent administration    ? from central apnea, very difficult to get off vent  . Arthritis    osteo  . Asthma   . BPH (benign prostatic hyperplasia)   . Complication of anesthesia    difficulty waking , they twlight me because of my respiratory problems "  . Depression    on zoloft for depression  . GERD (gastroesophageal reflux disease)   . Headache    botox injections for headaches  . Hyperlipidemia   . Hypertension   . Hypogonadism male   . IBS (irritable bowel syndrome)   . Obesity   . OSA (obstructive sleep apnea)   . Pre-diabetes   . Prostatitis     Allergies:  Allergies  Allergen Reactions  . Bee Venom Swelling  . Ppd [Tuberculin Purified Protein Derivative] Other (See Comments)    +ppd NEG Quantferron Gold 3/13  . Fenofibrate Other (See Comments)    Back pain  . Levofloxacin Diarrhea  . Other Other (See Comments)    Some antibiotics cause diarrhea  . Verapamil Other (See Comments)    Back pain  . Claritin [Loratadine] Other (See Comments)  . Cymbalta [Duloxetine Hcl] Other (See Comments)    unknown     Review of Systems:  Review of Systems  Constitutional: Positive for  malaise/fatigue. Negative for chills, diaphoresis, fever and weight loss.  HENT: Negative for congestion, ear pain and sore throat.   Eyes: Negative.   Respiratory: Positive for shortness of breath. Negative for cough and wheezing.   Cardiovascular: Positive for chest pain and leg swelling. Negative for palpitations.  Gastrointestinal: Positive for abdominal pain and diarrhea. Negative for blood in stool, constipation, heartburn, melena, nausea and vomiting.  Genitourinary: Positive for frequency. Negative for dysuria, flank pain, hematuria and urgency.  Musculoskeletal: Positive for back pain.  Skin: Negative.  Negative for itching and rash.  Neurological: Negative for dizziness, seizures, loss of consciousness, weakness and headaches.  Psychiatric/Behavioral: Positive for memory loss. Negative for depression. The patient is not nervous/anxious and does not have insomnia.     Family history- Review and unchanged  Social history- Review and unchanged  Physical Exam: BP 126/76   Pulse 82   Temp 97.5 F (36.4 C)   Resp 16   Ht 6\' 1"  (1.854 m)   Wt (!) 371 lb 9.6 oz (168.6 kg)   SpO2 97%   BMI 49.03 kg/m  Wt Readings from Last 3 Encounters:  07/24/16 (!) 371 lb 9.6 oz (168.6 kg)  07/18/16 (!) 376 lb 3.2 oz (170.6 kg)  07/16/16 (!) 370 lb (167.8 kg)    General Appearance: Well nourished well developed, in no apparent distress. Eyes: PERRLA, EOMs, conjunctiva no swelling or erythema ENT/Mouth: Ear canals normal without obstruction, swelling, erythma, discharge.  TMs normal bilaterally.  Oropharynx moist, clear, without exudate, or postoropharyngeal swelling. Neck: Supple, thyroid normal,no cervical adenopathy  Respiratory: Respiratory effort normal, Breath sounds clear A&P, decreased breath sounds without rhonchi, wheeze, or rale.  No retractions, no accessory usage. Cardio: RRR with no MRGs. Brisk peripheral pulses without edema.  Abdomen: Soft, + BS,  morbidly obese  Non tender,  no guarding, rebound, hernias, masses. Musculoskeletal: Full ROM, 5/5 strength, Normal gait Skin: Warm, dry without rashes, lesions, ecchymosis.  Neuro: Awake and oriented X 3, Cranial nerves intact. Normal muscle tone, no cerebellar symptoms. Psych: Normal affect, Insight and Judgment appropriate.    Vicie Mutters, PA-C 5:44 PM [facility]

## 2016-07-25 LAB — VITAMIN D 25 HYDROXY (VIT D DEFICIENCY, FRACTURES): Vit D, 25-Hydroxy: 65 ng/mL (ref 30–100)

## 2016-07-25 LAB — URINE CULTURE: Organism ID, Bacteria: NO GROWTH

## 2016-07-25 LAB — D-DIMER, QUANTITATIVE (NOT AT ARMC): D DIMER QUANT: 0.23 ug{FEU}/mL (ref <0.50)

## 2016-07-25 LAB — MAGNESIUM: Magnesium: 1.8 mg/dL (ref 1.5–2.5)

## 2016-07-26 LAB — ZINC: ZINC: 59 ug/dL — AB (ref 60–130)

## 2016-07-29 ENCOUNTER — Telehealth: Payer: Self-pay | Admitting: Pulmonary Disease

## 2016-07-29 ENCOUNTER — Ambulatory Visit (INDEPENDENT_AMBULATORY_CARE_PROVIDER_SITE_OTHER): Payer: BLUE CROSS/BLUE SHIELD | Admitting: *Deleted

## 2016-07-29 DIAGNOSIS — J4550 Severe persistent asthma, uncomplicated: Secondary | ICD-10-CM | POA: Diagnosis not present

## 2016-07-29 MED ORDER — MEPOLIZUMAB 100 MG ~~LOC~~ SOLR
100.0000 mg | SUBCUTANEOUS | Status: DC
  Administered 2016-07-29 – 2017-05-11 (×11): 100 mg via SUBCUTANEOUS

## 2016-07-29 NOTE — Telephone Encounter (Signed)
A fax was received from [facility] stating they left 3 messages for patient with no returned call for recent PAP order. Pt was called to see if we could reach him. He did not answer and a  voicemail was left for patient to contact office regarding PAP machine.

## 2016-07-30 NOTE — Telephone Encounter (Signed)
Called patient and left voicemail for patient to contact office to get him set up with PAP machine. Will try again at later time.

## 2016-07-31 NOTE — Telephone Encounter (Signed)
Called patient to speak with him regarding his PAP machine. Pt voicemail is full. Will try again later.

## 2016-08-04 ENCOUNTER — Other Ambulatory Visit: Payer: Self-pay

## 2016-08-04 DIAGNOSIS — I10 Essential (primary) hypertension: Secondary | ICD-10-CM

## 2016-08-04 MED ORDER — ATENOLOL 100 MG PO TABS: 100.0000 mg | ORAL_TABLET | Freq: Every day | ORAL | 1 refills | Status: DC

## 2016-08-06 NOTE — Telephone Encounter (Signed)
Called patient and left message for him to contact our office regarding his PAP machine.

## 2016-08-12 ENCOUNTER — Telehealth: Payer: Self-pay | Admitting: Allergy & Immunology

## 2016-08-12 NOTE — Telephone Encounter (Signed)
I called Mr. Dolley to let him know that [employer] denied the request for Hizentra. There was no answer therefore I left a message asking him to give me a call back to discuss options. He is receiving Nucala now and he has recently gotten back into using his CPAP. Therefore it might be reasonable to give these modalities time to work before proceeding with a peer-to-peer discussion with [employer] to help with Hizentra approval. I left message asking him to call me back when he gets a chance.  [name], MD [place] [facility] [place]

## 2016-08-12 NOTE — Telephone Encounter (Signed)
Called the patient and left message to contact office. Due to multiple failed attempts to reach patient, a letter is being sent to pt's address on file to contact office. Will contact patient again at later date.

## 2016-08-12 NOTE — Telephone Encounter (Signed)
Okay just let me know if you want to move forward with appeal in future.

## 2016-08-15 NOTE — Telephone Encounter (Signed)
Could someone call to see how Mark Harrell is doing on the Nucala? I left a message a couple of days ago but I have not heard from him. Thanks!   [name], MD [place] [facility] [place]

## 2016-08-15 NOTE — Telephone Encounter (Signed)
Left message to return call 

## 2016-08-18 NOTE — Telephone Encounter (Signed)
Left message for patient to call back and let us know how he is doing after having his first Nucala.

## 2016-08-20 DIAGNOSIS — J4550 Severe persistent asthma, uncomplicated: Secondary | ICD-10-CM | POA: Diagnosis not present

## 2016-08-26 ENCOUNTER — Ambulatory Visit (INDEPENDENT_AMBULATORY_CARE_PROVIDER_SITE_OTHER): Payer: BLUE CROSS/BLUE SHIELD | Admitting: *Deleted

## 2016-08-26 DIAGNOSIS — J4550 Severe persistent asthma, uncomplicated: Secondary | ICD-10-CM

## 2016-08-28 ENCOUNTER — Other Ambulatory Visit: Payer: Self-pay | Admitting: Internal Medicine

## 2016-08-29 NOTE — Telephone Encounter (Signed)
Entered in error

## 2016-09-09 ENCOUNTER — Other Ambulatory Visit: Payer: Self-pay

## 2016-09-09 MED ORDER — BISOPROLOL-HYDROCHLOROTHIAZIDE 5-6.25 MG PO TABS: 1.0000 | ORAL_TABLET | Freq: Every day | ORAL | 0 refills | Status: DC

## 2016-09-15 ENCOUNTER — Ambulatory Visit: Payer: BLUE CROSS/BLUE SHIELD | Admitting: Pulmonary Disease

## 2016-09-19 ENCOUNTER — Telehealth: Payer: Self-pay | Admitting: Pulmonary Disease

## 2016-09-19 NOTE — Telephone Encounter (Signed)
Spoke with pt and he was stated he kept missing [facility]'s call in the past due to him going in and out of the hospital. Informed the pt that we will reach out to [facility] and inform them that he does want to set up cpap. Contact [facility] who stated they needed the order re-faxed. Spoke with Judeen Hammans who said she will send a message to [place]. Pt had no further questions. Nothing further is needed

## 2016-09-19 NOTE — Telephone Encounter (Signed)
lmom tcb x1  Phone note from March 6,2018 states [facility] had tried to reach pt several times about his cpap.Mark KitchenMarland KitchenMarland Harrell

## 2016-09-23 ENCOUNTER — Ambulatory Visit (INDEPENDENT_AMBULATORY_CARE_PROVIDER_SITE_OTHER): Payer: BLUE CROSS/BLUE SHIELD | Admitting: *Deleted

## 2016-09-23 DIAGNOSIS — J4550 Severe persistent asthma, uncomplicated: Secondary | ICD-10-CM

## 2016-10-02 ENCOUNTER — Ambulatory Visit (INDEPENDENT_AMBULATORY_CARE_PROVIDER_SITE_OTHER): Payer: BLUE CROSS/BLUE SHIELD | Admitting: Internal Medicine

## 2016-10-02 VITALS — BP 134/88 | HR 96 | Temp 97.3°F | Resp 16 | Ht 72.0 in | Wt 366.4 lb

## 2016-10-02 DIAGNOSIS — Z0000 Encounter for general adult medical examination without abnormal findings: Secondary | ICD-10-CM | POA: Diagnosis not present

## 2016-10-02 DIAGNOSIS — Z125 Encounter for screening for malignant neoplasm of prostate: Secondary | ICD-10-CM | POA: Diagnosis not present

## 2016-10-02 DIAGNOSIS — E119 Type 2 diabetes mellitus without complications: Secondary | ICD-10-CM

## 2016-10-02 DIAGNOSIS — Z1212 Encounter for screening for malignant neoplasm of rectum: Secondary | ICD-10-CM

## 2016-10-02 DIAGNOSIS — R5383 Other fatigue: Secondary | ICD-10-CM

## 2016-10-02 DIAGNOSIS — Z136 Encounter for screening for cardiovascular disorders: Secondary | ICD-10-CM | POA: Diagnosis not present

## 2016-10-02 DIAGNOSIS — E559 Vitamin D deficiency, unspecified: Secondary | ICD-10-CM | POA: Diagnosis not present

## 2016-10-02 DIAGNOSIS — I10 Essential (primary) hypertension: Secondary | ICD-10-CM | POA: Diagnosis not present

## 2016-10-02 DIAGNOSIS — Z0001 Encounter for general adult medical examination with abnormal findings: Secondary | ICD-10-CM

## 2016-10-02 DIAGNOSIS — E782 Mixed hyperlipidemia: Secondary | ICD-10-CM

## 2016-10-02 DIAGNOSIS — Z79899 Other long term (current) drug therapy: Secondary | ICD-10-CM | POA: Diagnosis not present

## 2016-10-02 DIAGNOSIS — Z1211 Encounter for screening for malignant neoplasm of colon: Secondary | ICD-10-CM

## 2016-10-02 DIAGNOSIS — E039 Hypothyroidism, unspecified: Secondary | ICD-10-CM

## 2016-10-02 LAB — CBC WITH DIFFERENTIAL/PLATELET
BASOS PCT: 1 %
Basophils Absolute: 106 cells/uL (ref 0–200)
EOS PCT: 0 %
Eosinophils Absolute: 0 cells/uL — ABNORMAL LOW (ref 15–500)
HCT: 48.8 % (ref 38.5–50.0)
HEMOGLOBIN: 16.1 g/dL (ref 13.2–17.1)
Lymphocytes Relative: 30 %
Lymphs Abs: 3180 cells/uL (ref 850–3900)
MCH: 29.5 pg (ref 27.0–33.0)
MCHC: 33 g/dL (ref 32.0–36.0)
MCV: 89.5 fL (ref 80.0–100.0)
MONO ABS: 1166 {cells}/uL — AB (ref 200–950)
MPV: 10.7 fL (ref 7.5–12.5)
Monocytes Relative: 11 %
NEUTROS ABS: 6148 {cells}/uL (ref 1500–7800)
Neutrophils Relative %: 58 %
Platelets: 293 10*3/uL (ref 140–400)
RBC: 5.45 MIL/uL (ref 4.20–5.80)
RDW: 13.9 % (ref 11.0–15.0)
WBC: 10.6 10*3/uL (ref 3.8–10.8)

## 2016-10-02 LAB — VITAMIN B12: VITAMIN B 12: 949 pg/mL (ref 200–1100)

## 2016-10-02 LAB — TSH: TSH: 1.08 mIU/L (ref 0.40–4.50)

## 2016-10-02 LAB — PSA: PSA: 0.6 ng/mL (ref <=4.0)

## 2016-10-02 NOTE — Patient Instructions (Signed)
Preventive Care for Adults  A healthy lifestyle and preventive care can promote health and wellness. Preventive health guidelines for men include the following key practices:  A routine yearly physical is a good way to check with your health care provider about your health and preventative screening. It is a chance to share any concerns and updates on your health and to receive a thorough exam.  Visit your dentist for a routine exam and preventative care every 6 months. Brush your teeth twice a day and floss once a day. Good oral hygiene prevents tooth decay and gum disease.  The frequency of eye exams is based on your age, health, family medical history, use of contact lenses, and other factors. Follow your health care provider's recommendations for frequency of eye exams.  Eat a healthy diet. Foods such as vegetables, fruits, whole grains, low-fat dairy products, and lean protein foods contain the nutrients you need without too many calories. Decrease your intake of foods high in solid fats, added sugars, and salt. Eat the right amount of calories for you.Get information about a proper diet from your health care provider, if necessary.  Regular physical exercise is one of the most important things you can do for your health. Most adults should get at least 150 minutes of moderate-intensity exercise (any activity that increases your heart rate and causes you to sweat) each week. In addition, most adults need muscle-strengthening exercises on 2 or more days a week.  Maintain a healthy weight. The body mass index (BMI) is a screening tool to identify possible weight problems. It provides an estimate of body fat based on height and weight. Your health care provider can find your BMI and can help you achieve or maintain a healthy weight.For adults 20 years and older:  A BMI below 18.5 is considered underweight.  A BMI of 18.5 to 24.9 is normal.  A BMI of 25 to 29.9 is considered overweight.  A  BMI of 30 and above is considered obese.  Maintain normal blood lipids and cholesterol levels by exercising and minimizing your intake of saturated fat. Eat a balanced diet with plenty of fruit and vegetables. Blood tests for lipids and cholesterol should begin at age 20 and be repeated every 5 years. If your lipid or cholesterol levels are high, you are over 50, or you are at high risk for heart disease, you may need your cholesterol levels checked more frequently.Ongoing high lipid and cholesterol levels should be treated with medicines if diet and exercise are not working.  If you smoke, find out from your health care provider how to quit. If you do not use tobacco, do not start.  Lung cancer screening is recommended for adults aged 55-80 years who are at high risk for developing lung cancer because of a history of smoking. A yearly low-dose CT scan of the lungs is recommended for people who have at least a 30-pack-year history of smoking and are a current smoker or have quit within the past 15 years. A pack year of smoking is smoking an average of 1 pack of cigarettes a day for 1 year (for example: 1 pack a day for 30 years or 2 packs a day for 15 years). Yearly screening should continue until the smoker has stopped smoking for at least 15 years. Yearly screening should be stopped for people who develop a health problem that would prevent them from having lung cancer treatment.  If you choose to drink alcohol, do not have more   than 2 drinks per day. One drink is considered to be 12 ounces (355 mL) of beer, 5 ounces (148 mL) of wine, or 1.5 ounces (44 mL) of liquor.  Avoid use of street drugs. Do not share needles with anyone. Ask for help if you need support or instructions about stopping the use of drugs.  High blood pressure causes heart disease and increases the risk of stroke. Your blood pressure should be checked at least every 1-2 years. Ongoing high blood pressure should be treated with  medicines, if weight loss and exercise are not effective.  If you are 45-79 years old, ask your health care provider if you should take aspirin to prevent heart disease.  Diabetes screening involves taking a blood sample to check your fasting blood sugar level. This should be done once every 3 years, after age 45, if you are within normal weight and without risk factors for diabetes. Testing should be considered at a younger age or be carried out more frequently if you are overweight and have at least 1 risk factor for diabetes.  Colorectal cancer can be detected and often prevented. Most routine colorectal cancer screening begins at the age of 50 and continues through age 75. However, your health care provider may recommend screening at an earlier age if you have risk factors for colon cancer. On a yearly basis, your health care provider may provide home test kits to check for hidden blood in the stool. Use of a small camera at the end of a tube to directly examine the colon (sigmoidoscopy or colonoscopy) can detect the earliest forms of colorectal cancer. Talk to your health care provider about this at age 50, when routine screening begins. Direct exam of the colon should be repeated every 5-10 years through age 75, unless early forms of precancerous polyps or small growths are found.   Talk with your health care provider about prostate cancer screening.  Testicular cancer screening isrecommended for adult males. Screening includes self-exam, a health care provider exam, and other screening tests. Consult with your health care provider about any symptoms you have or any concerns you have about testicular cancer.  Use sunscreen. Apply sunscreen liberally and repeatedly throughout the day. You should seek shade when your shadow is shorter than you. Protect yourself by wearing long sleeves, pants, a wide-brimmed hat, and sunglasses year round, whenever you are outdoors.  Once a month, do a whole-body  skin exam, using a mirror to look at the skin on your back. Tell your health care provider about new moles, moles that have irregular borders, moles that are larger than a pencil eraser, or moles that have changed in shape or color.  Stay current with required vaccines (immunizations).  Influenza vaccine. All adults should be immunized every year.  Tetanus, diphtheria, and acellular pertussis (Td, Tdap) vaccine. An adult who has not previously received Tdap or who does not know his vaccine status should receive 1 dose of Tdap. This initial dose should be followed by tetanus and diphtheria toxoids (Td) booster doses every 10 years. Adults with an unknown or incomplete history of completing a 3-dose immunization series with Td-containing vaccines should begin or complete a primary immunization series including a Tdap dose. Adults should receive a Td booster every 10 years.  Varicella vaccine. An adult without evidence of immunity to varicella should receive 2 doses or a second dose if he has previously received 1 dose.  Human papillomavirus (HPV) vaccine. Males aged 13-21 years who have not   received the vaccine previously should receive the 3-dose series. Males aged 22-26 years may be immunized. Immunization is recommended through the age of 26 years for any male who has sex with males and did not get any or all doses earlier. Immunization is recommended for any person with an immunocompromised condition through the age of 26 years if he did not get any or all doses earlier. During the 3-dose series, the second dose should be obtained 4-8 weeks after the first dose. The third dose should be obtained 24 weeks after the first dose and 16 weeks after the second dose.  Zoster vaccine. One dose is recommended for adults aged 60 years or older unless certain conditions are present.    PREVNAR  - Pneumococcal 13-valent conjugate (PCV13) vaccine. When indicated, a person who is uncertain of his immunization  history and has no record of immunization should receive the PCV13 vaccine. An adult aged 19 years or older who has certain medical conditions and has not been previously immunized should receive 1 dose of PCV13 vaccine. This PCV13 should be followed with a dose of pneumococcal polysaccharide (PPSV23) vaccine. The PPSV23 vaccine dose should be obtained at least 8 weeks after the dose of PCV13 vaccine. An adult aged 19 years or older who has certain medical conditions and previously received 1 or more doses of PPSV23 vaccine should receive 1 dose of PCV13. The PCV13 vaccine dose should be obtained 1 or more years after the last PPSV23 vaccine dose.    PNEUMOVAX - Pneumococcal polysaccharide (PPSV23) vaccine. When PCV13 is also indicated, PCV13 should be obtained first. All adults aged 65 years and older should be immunized. An adult younger than age 65 years who has certain medical conditions should be immunized. Any person who resides in a nursing home or long-term care facility should be immunized. An adult smoker should be immunized. People with an immunocompromised condition and certain other conditions should receive both PCV13 and PPSV23 vaccines. People with human immunodeficiency virus (HIV) infection should be immunized as soon as possible after diagnosis. Immunization during chemotherapy or radiation therapy should be avoided. Routine use of PPSV23 vaccine is not recommended for American Indians, [place] Natives, or people younger than 65 years unless there are medical conditions that require PPSV23 vaccine. When indicated, people who have unknown immunization and have no record of immunization should receive PPSV23 vaccine. One-time revaccination 5 years after the first dose of PPSV23 is recommended for people aged 19-64 years who have chronic kidney failure, nephrotic syndrome, asplenia, or immunocompromised conditions. People who received 1-2 doses of PPSV23 before age 65 years should receive another  dose of PPSV23 vaccine at age 65 years or later if at least 5 years have passed since the previous dose. Doses of PPSV23 are not needed for people immunized with PPSV23 at or after age 65 years.    Hepatitis A vaccine. Adults who wish to be protected from this disease, have certain high-risk conditions, work with hepatitis A-infected animals, work in hepatitis A research labs, or travel to or work in countries with a high rate of hepatitis A should be immunized. Adults who were previously unvaccinated and who anticipate close contact with an international adoptee during the first 60 days after arrival in the [place] from a country with a high rate of hepatitis A should be immunized.    Hepatitis B vaccine. Adults should be immunized if they wish to be protected from this disease, have certain high-risk conditions, may be exposed to   blood or other infectious body fluids, are household contacts or sex partners of hepatitis B positive people, are clients or workers in certain care facilities, or travel to or work in countries with a high rate of hepatitis B.   Preventive Service / Frequency   Ages 40 to 64  Blood pressure check.  Lipid and cholesterol check  Lung cancer screening. / Every year if you are aged 55-80 years and have a 30-pack-year history of smoking and currently smoke or have quit within the past 15 years. Yearly screening is stopped once you have quit smoking for at least 15 years or develop a health problem that would prevent you from having lung cancer treatment.  Fecal occult blood test (FOBT) of stool. / Every year beginning at age 50 and continuing until age 75. You may not have to do this test if you get a colonoscopy every 10 years.  Flexible sigmoidoscopy** or colonoscopy.** / Every 5 years for a flexible sigmoidoscopy or every 10 years for a colonoscopy beginning at age 50 and continuing until age 75. Screening for abdominal aortic aneurysm (AAA)  by ultrasound is  recommended for people who have history of high blood pressure or who are current or former smokers. +++++++++++ Recommend Adult Low Dose Aspirin or  coated  Aspirin 81 mg daily  To reduce risk of Colon Cancer 20 %,  Skin Cancer 26 % ,  Melanoma 46%  and  Pancreatic cancer 60% ++++++++++++++++++++ Vitamin D goal  is between 70-100.  Please make sure that you are taking your Vitamin D as directed.  It is very important as a natural anti-inflammatory  helping hair, skin, and nails, as well as reducing stroke and heart attack risk.  It helps your bones and helps with mood. It also decreases numerous cancer risks so please take it as directed.  Low Vit D is associated with a 200-300% higher risk for CANCER  and 200-300% higher risk for HEART   ATTACK  &  STROKE.   ...................................... It is also associated with higher death rate at younger ages,  autoimmune diseases like Rheumatoid arthritis, Lupus, Multiple Sclerosis.    Also many other serious conditions, like depression, Alzheimer's Dementia, infertility, muscle aches, fatigue, fibromyalgia - just to name a few. +++++++++++++++++++++ Recommend the book "The END of DIETING" by Dr Joel Fuhrman  & the book "The END of DIABETES " by Dr Joel Fuhrman At [employer] - get book & Audio CD's    Being diabetic has a  300% increased risk for heart attack, stroke, cancer, and alzheimer- type vascular dementia. It is very important that you work harder with diet by avoiding all foods that are white. Avoid white rice (brown & wild rice is OK), white potatoes (sweetpotatoes in moderation is OK), White bread or wheat bread or anything made out of white flour like bagels, donuts, rolls, buns, biscuits, cakes, pastries, cookies, pizza crust, and pasta (made from white flour & egg whites) - vegetarian pasta or spinach or wheat pasta is OK. Multigrain breads like Arnold's or [employer], or multigrain sandwich thins or flatbreads.  Diet,  exercise and weight loss can reverse and cure diabetes in the early stages.  Diet, exercise and weight loss is very important in the control and prevention of complications of diabetes which affects every system in your body, ie. Brain - dementia/stroke, eyes - glaucoma/blindness, heart - heart attack/heart failure, kidneys - dialysis, stomach - gastric paralysis, intestines - malabsorption, nerves - severe painful neuritis, circulation -   gangrene & loss of a leg(s), and finally cancer and Alzheimers.    I recommend avoid fried & greasy foods,  sweets/candy, white rice (brown or wild rice or Quinoa is OK), white potatoes (sweet potatoes are OK) - anything made from white flour - bagels, doughnuts, rolls, buns, biscuits,white and wheat breads, pizza crust and traditional pasta made of white flour & egg white(vegetarian pasta or spinach or wheat pasta is OK).  Multi-grain bread is OK - like multi-grain flat bread or sandwich thins. Avoid alcohol in excess. Exercise is also important.    Eat all the vegetables you want - avoid meat, especially red meat and dairy - especially cheese.  Cheese is the most concentrated form of trans-fats which is the worst thing to clog up our arteries. Veggie cheese is OK which can be found in the fresh produce section at [employer] or [employer] or [employer]  ++++++++++++++++++++++ DASH Eating Plan  DASH stands for "Dietary Approaches to Stop Hypertension."   The DASH eating plan is a healthy eating plan that has been shown to reduce high blood pressure (hypertension). Additional health benefits may include reducing the risk of type 2 diabetes mellitus, heart disease, and stroke. The DASH eating plan may also help with weight loss. WHAT DO I NEED TO KNOW ABOUT THE DASH EATING PLAN? For the DASH eating plan, you will follow these general guidelines:  Choose foods with a percent daily value for sodium of less than 5% (as listed on the food label).  Use salt-free  seasonings or herbs instead of table salt or sea salt.  Check with your health care provider or pharmacist before using salt substitutes.  Eat lower-sodium products, often labeled as "lower sodium" or "no salt added."  Eat fresh foods.  Eat more vegetables, fruits, and low-fat dairy products.  Choose whole grains. Look for the word "whole" as the first word in the ingredient list.  Choose fish   Limit sweets, desserts, sugars, and sugary drinks.  Choose heart-healthy fats.  Eat veggie cheese   Eat more home-cooked food and less restaurant, buffet, and fast food.  Limit fried foods.  Cook foods using methods other than frying.  Limit canned vegetables. If you do use them, rinse them well to decrease the sodium.  When eating at a restaurant, ask that your food be prepared with less salt, or no salt if possible.                      WHAT FOODS CAN I EAT? Read Dr Joel Fuhrman's books on The End of Dieting & The End of Diabetes  Grains Whole grain or whole wheat bread. Brown rice. Whole grain or whole wheat pasta. Quinoa, bulgur, and whole grain cereals. Low-sodium cereals. Corn or whole wheat flour tortillas. Whole grain cornbread. Whole grain crackers. Low-sodium crackers.  Vegetables Fresh or frozen vegetables (raw, steamed, roasted, or grilled). Low-sodium or reduced-sodium tomato and vegetable juices. Low-sodium or reduced-sodium tomato sauce and paste. Low-sodium or reduced-sodium canned vegetables.   Fruits All fresh, canned (in natural juice), or frozen fruits.  Protein Products  All fish and seafood.  Dried beans, peas, or lentils. Unsalted nuts and seeds. Unsalted canned beans.  Dairy Low-fat dairy products, such as skim or 1% milk, 2% or reduced-fat cheeses, low-fat ricotta or cottage cheese, or plain low-fat yogurt. Low-sodium or reduced-sodium cheeses.  Fats and Oils Tub margarines without trans fats. Light or reduced-fat mayonnaise and salad dressings  (reduced sodium). Avocado. Safflower,   olive, or canola oils. Natural peanut or almond butter.  Other Unsalted popcorn and pretzels. The items listed above may not be a complete list of recommended foods or beverages. Contact your dietitian for more options.  +++++++++++++++++++  WHAT FOODS ARE NOT RECOMMENDED? Grains/ White flour or wheat flour White bread. White pasta. White rice. Refined cornbread. Bagels and croissants. Crackers that contain trans fat.  Vegetables  Creamed or fried vegetables. Vegetables in a . Regular canned vegetables. Regular canned tomato sauce and paste. Regular tomato and vegetable juices.  Fruits Dried fruits. Canned fruit in light or heavy syrup. Fruit juice.  Meat and Other Protein Products Meat in general - RED meat & White meat.  Fatty cuts of meat. Ribs, chicken wings, all processed meats as bacon, sausage, bologna, salami, fatback, hot dogs, bratwurst and packaged luncheon meats.  Dairy Whole or 2% milk, cream, half-and-half, and cream cheese. Whole-fat or sweetened yogurt. Full-fat cheeses or blue cheese. Non-dairy creamers and whipped toppings. Processed cheese, cheese spreads, or cheese curds.  Condiments Onion and garlic salt, seasoned salt, table salt, and sea salt. Canned and packaged gravies. Worcestershire sauce. Tartar sauce. Barbecue sauce. Teriyaki sauce. Soy sauce, including reduced sodium. Steak sauce. Fish sauce. Oyster sauce. Cocktail sauce. Horseradish. Ketchup and mustard. Meat flavorings and tenderizers. Bouillon cubes. Hot sauce. Tabasco sauce. Marinades. Taco seasonings. Relishes.  Fats and Oils Butter, stick margarine, lard, shortening and bacon fat. Coconut, palm kernel, or palm oils. Regular salad dressings.  Pickles and olives. Salted popcorn and pretzels.  The items listed above may not be a complete list of foods and beverages to avoid.    
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
## 2016-10-03 DIAGNOSIS — G4733 Obstructive sleep apnea (adult) (pediatric): Secondary | ICD-10-CM | POA: Diagnosis not present

## 2016-10-03 LAB — IRON AND TIBC
%SAT: 33 % (ref 15–60)
IRON: 118 ug/dL (ref 50–180)
TIBC: 363 ug/dL (ref 250–425)
UIBC: 245 ug/dL (ref 125–400)

## 2016-10-03 LAB — MAGNESIUM: Magnesium: 1.8 mg/dL (ref 1.5–2.5)

## 2016-10-03 LAB — LIPID PANEL
CHOL/HDL RATIO: 4.1 ratio (ref <5.0)
CHOLESTEROL: 128 mg/dL (ref <200)
HDL: 31 mg/dL — ABNORMAL LOW (ref >40)
LDL Cholesterol: 52 mg/dL (ref <100)
TRIGLYCERIDES: 223 mg/dL — AB (ref <150)
VLDL: 45 mg/dL — ABNORMAL HIGH (ref <30)

## 2016-10-03 LAB — TESTOSTERONE: TESTOSTERONE: 108 ng/dL — AB (ref 250–827)

## 2016-10-03 LAB — BASIC METABOLIC PANEL WITH GFR
BUN: 15 mg/dL (ref 7–25)
CALCIUM: 9.3 mg/dL (ref 8.6–10.3)
CO2: 26 mmol/L (ref 20–31)
Chloride: 100 mmol/L (ref 98–110)
Creat: 0.84 mg/dL (ref 0.70–1.33)
GFR, Est African American: >89 mL/min (ref >=60)
Glucose, Bld: 92 mg/dL (ref 65–99)
POTASSIUM: 4.1 mmol/L (ref 3.5–5.3)
SODIUM: 142 mmol/L (ref 135–146)

## 2016-10-03 LAB — HEMOGLOBIN A1C
HEMOGLOBIN A1C: 5.5 % (ref <5.7)
Mean Plasma Glucose: 111 mg/dL

## 2016-10-03 LAB — HEPATIC FUNCTION PANEL
ALK PHOS: 57 U/L (ref 40–115)
ALT: 22 U/L (ref 9–46)
AST: 20 U/L (ref 10–35)
Albumin: 3.9 g/dL (ref 3.6–5.1)
BILIRUBIN DIRECT: 0.3 mg/dL — AB (ref <=0.2)
BILIRUBIN INDIRECT: 1.3 mg/dL — AB (ref 0.2–1.2)
Total Bilirubin: 1.6 mg/dL — ABNORMAL HIGH (ref 0.2–1.2)
Total Protein: 6.4 g/dL (ref 6.1–8.1)

## 2016-10-03 LAB — VITAMIN D 25 HYDROXY (VIT D DEFICIENCY, FRACTURES): Vit D, 25-Hydroxy: 82 ng/mL (ref 30–100)

## 2016-10-03 LAB — INSULIN, RANDOM: Insulin: 34.8 u[IU]/mL — ABNORMAL HIGH (ref 2.0–19.6)

## 2016-10-04 ENCOUNTER — Encounter: Payer: Self-pay | Admitting: Internal Medicine

## 2016-10-13 ENCOUNTER — Ambulatory Visit: Payer: BLUE CROSS/BLUE SHIELD | Admitting: Allergy & Immunology

## 2016-10-16 DIAGNOSIS — J4550 Severe persistent asthma, uncomplicated: Secondary | ICD-10-CM | POA: Diagnosis not present

## 2016-10-22 ENCOUNTER — Ambulatory Visit (INDEPENDENT_AMBULATORY_CARE_PROVIDER_SITE_OTHER): Payer: BLUE CROSS/BLUE SHIELD | Admitting: Allergy & Immunology

## 2016-10-22 ENCOUNTER — Ambulatory Visit (INDEPENDENT_AMBULATORY_CARE_PROVIDER_SITE_OTHER): Payer: BLUE CROSS/BLUE SHIELD

## 2016-10-22 ENCOUNTER — Encounter: Payer: Self-pay | Admitting: Allergy & Immunology

## 2016-10-22 VITALS — BP 130/86 | HR 84 | Resp 20

## 2016-10-22 DIAGNOSIS — B999 Unspecified infectious disease: Secondary | ICD-10-CM | POA: Diagnosis not present

## 2016-10-22 DIAGNOSIS — J4550 Severe persistent asthma, uncomplicated: Secondary | ICD-10-CM

## 2016-10-22 DIAGNOSIS — J449 Chronic obstructive pulmonary disease, unspecified: Secondary | ICD-10-CM | POA: Diagnosis not present

## 2016-10-22 DIAGNOSIS — J3089 Other allergic rhinitis: Secondary | ICD-10-CM | POA: Diagnosis not present

## 2016-10-22 MED ORDER — FLUCONAZOLE 100 MG PO TABS: ORAL_TABLET | ORAL | 0 refills | Status: DC

## 2016-10-22 MED ORDER — BUDESONIDE-FORMOTEROL FUMARATE 160-4.5 MCG/ACT IN AERO: 2.0000 | INHALATION_SPRAY | Freq: Two times a day (BID) | RESPIRATORY_TRACT | 5 refills | Status: DC

## 2016-10-22 NOTE — Progress Notes (Signed)
FOLLOW UP  Date of Service/Encounter:  10/23/16   Assessment:   Asthma-COPD overlap syndrome  Recurrent infections - with isolated low IgG  Chronic non-seasonal allergic rhinitis (grasses, trees, weeds, ragweed, cat, mold, cockroach, dust mite)   Obstructive sleep apnea  Complex medical history (IBS, hypertension, diabetes mellitus, vitamin D deficiency, obesity)   Asthma Reportables:  Severity: severe persistent  Risk: high Control: well controlled   Plan/Recommendations:    1. Recurrent infections - with isolated low IgG - Immunoglobulin replacement was rejected but we will keep it on the back burner for now.  - It seems that the Nucala may have helped decrease the incidence of infections as well.  - Oftentimes, recurrent infections are a manifestation of uncontrolled atopic disease. - With the addition of the immunotherapy, I anticipate that this will help his symptoms as well.   2. Asthma-COPD overlap syndrome - Lung function looked stable today. - Daily controller medication(s): Symbicort 160/4.5 two puffs twice daily with spacer + Nucala monthly - Rescue medications: ProAir 4 puffs every 4-6 hours as needed or albuterol nebulizer one vial puffs every 4-6 hours as needed - Asthma control goals:  * Full participation in all desired activities (may need albuterol before activity) * Albuterol use two time or less a week on average (not counting use with activity) * Cough interfering with sleep two time or less a month * Oral steroids no more than once a year * No hospitalizations  - I would ideally like to keep his asthma well controlled so that he can exercise more and lose weight. - Weight loss would help with his breathing and would help with joint pain.  3. Chronic allergic rhinitis (sweet vernal grass, box elder, cat, weeds, ragweed, molds, cockroach, dust mite) - Continue with Dymista 2 sprays per nostril 1-2 times daily. - Continue with saline mist 1-2  times daily.  - Use cetirizine 10mg  daily as needed for breakthrough symptoms. - Information provided on allergy shots and consent signed today. - We will mix the vials and have him make an appointment in two weeks to start.   4. Obstructive sleep apnea  - Talk to Dr. Creig Harrell about changing your mask.  - Start Afrin one spray per nostril nightly to help open your nose up prior to placement of the mask.  - With the concurrent use of a nasal steroid, I anticipate that one spray daily should not cause rhinitis medicamentosa.   5. Oral candidiasis - Start fluconazole (100mg ) one tablet today and then one tablet (100mg ) in one week. - Be sure to rinse well when you use your Symbicort.    6. Return in about 3 months (around 01/22/2017).   Subjective:   Mark Mark Harrell is a 56 y.o. male presenting today for follow up of  Chief Complaint  Patient presents with  . Asthma    Mark Mark Harrell has a history of the following: Patient Active Problem List   Diagnosis Date Noted  . Irritant contact dermatitis due to frequent handwashing 06/08/2016  . Sepsis ([place]) 06/03/2016  . Nausea vomiting and diarrhea 06/03/2016  . Fever 06/03/2016  . Acute asthma exacerbation 05/30/2016  . Acute exacerbation of COPD with asthma ([place]) 05/29/2016  . COPD mixed type ([place]) 05/29/2016  . DM (diabetes mellitus) ([place]) 05/29/2016  . Acute medial meniscal tear, left, subsequent encounter 04/23/2016  . Recurrent infections 03/29/2016  . Chronic nonseasonal allergic rhinitis due to pollen 03/29/2016  . Glucose intolerance (impaired glucose tolerance) 01/16/2016  .  Polypharmacy 01/16/2016  . BMI 47.46,   adult ([place]) 03/06/2015  . SDAT 02/05/2015  . Asthma-COPD overlap syndrome ([place]) 02/05/2015  . OSA (obstructive sleep apnea) 02/05/2015  . Medication management 08/02/2014  . GERD (gastroesophageal reflux disease) 05/09/2014  . Obesity, Class III, BMI 40-49.9 (morbid obesity) ([place]) 02/01/2014  . Vitamin D deficiency  08/01/2013  . Prediabetes 08/01/2013  . 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 Diverticula of colon 05/07/2011  . Hypertension 01/31/2011  . Hyperlipidemia 01/31/2011  . Migraine 01/31/2011  . BPH (benign prostatic hyperplasia) 01/31/2011  . Testosterone Deficiency 01/31/2011  . IBS (irritable bowel syndrome) 01/31/2011  . Partial complex seizure disorder with intractable epilepsy ([place]) 01/31/2011  . Depression, controlled 01/31/2011  . COPD with asthma ([place]) 01/31/2011    History obtained from: chart review and patient.  Mark Mark Harrell was referred by Mark Pinto, MD.     Mark Harrell is a 56 y.o. male presenting for a follow up visit. He has a very complicated past medical history including recurrent sepsis requiring hospitalizations. He was most recently hospitalized earlier this spring. He has a history of severe persistent asthma as well as allergic rhinitis. We added Nucala in Mark Harrell 2018 which seems to have stabilized his asthma. We have done an immune workup in the past which was largely normal aside from a slightly low IgG which was felt to be secondary to chronic prednisone use. We did try to obtain coverage for Hizentra, however it was denied. We have not gone through with a peer-to-peer since we were hoping that the addition of Nucala would improve his health status. He was last seen in an office visit in February, at which time he was noted to be hypotensive in our clinic. We sent him to the ED where he had a full workup performed that was largely normal. His BP returned to normal. We felt that he might have some adrenal insufficiency, however his CMP was fairly normal.   Since the last visit, Mark Mark Harrell has not had any hospitalizations since he was last seen 3 months ago, and reports his asthma/COPD is fairly well-controlled since last time he was here. He estimates he has to use his rescue inhaler once a week, and has not had any severe exacerbations since he was last here. He does note,  however, that he is overall down from his baseline since having multiple hospitalizations in the past year. He reports DOE, and feels as though he is pretty limited in terms of what he is able to do as far as physical activity. He denies night time symptoms of coughing and wheezing.   He recently started using a CPAP machine 3 weeks ago, and reports having some issues with it. He feels as though the mask isn't a correct fit, so he is planning on calling to having it refitted this week. He also reports swelling of his nasal mucosa, which causes him to breathe mainly through his mouth when on the machine, causing his mouth to dry out. He is taking Dymista 2 sprays per nostril 1-2 times daily and doing saline rinses before he goes to sleep, but still has minimal relief and wonders if there is anything else we could try to decrease nasal swelling.His last appointment with Dr. Creig Harrell was in February 2018.   From an allergic rhinitis perspective, he continues with his Dymista two sprays per nostril daily. He uses nasal saline rinses prior to using the [place]. He is also on an antihistamine and Singulair. He is  interested in starting shots today. Reflux seems well controlled with Pepcid. He does have a history of a small hiatal hernia.    Otherwise, there have been no changes to his past medical history, surgical history, family history, or social history.    Review of Systems: a 14-point review of systems is pertinent for what is mentioned in HPI.  Otherwise, all other systems were negative. Constitutional: negative other than that listed in the HPI Eyes: negative other than that listed in the HPI Ears, nose, mouth, throat, and face: negative other than that listed in the HPI Respiratory: negative other than that listed in the HPI Cardiovascular: negative other than that listed in the HPI Gastrointestinal: negative other than that listed in the HPI Genitourinary: negative other than that listed in the  HPI Integument: negative other than that listed in the HPI Hematologic: negative other than that listed in the HPI Musculoskeletal: negative other than that listed in the HPI Neurological: negative other than that listed in the HPI Allergy/Immunologic: negative other than that listed in the HPI    Objective:   Blood pressure 130/86, pulse 84, resp. rate 20. There is no height or weight on file to calculate BMI.   Physical Exam:  General: Alert, interactive, in no acute distress. Pleasant obese male.  Eyes: No conjunctival injection present on the right, No conjunctival injection present on the left, PERRL bilaterally, No discharge on the right and No discharge on the left Ears: Right TM pearly gray with normal light reflex and Left TM pearly gray with normal light reflex.  Nose/Throat: External nose within normal limits, turbinates non-edematous with clear discharge, post-pharynx unremarkable without cobblestoning in the posterior oropharynx. Tonsils unremarklable without exudates; tongue had friable white plaques Neck: Supple without thyromegaly. Lungs: Clear to auscultation without wheezing, rhonchi or rales. No increased work of breathing. CV: Normal S1/S2, no murmurs. Capillary refill <2 seconds.  Skin: Warm and dry, without lesions or rashes. Neuro:   Grossly intact. No focal deficits appreciated. Responsive to questions.   Diagnostic studies:   Spirometry: results abnormal (FEV1: 58%, FVC: 60%, FEV1/FVC: 95%).    Spirometry consistent with possible restrictive disease. Overall his numbers are slightly down from the last visit but not overly concerning.   Allergy Studies: none   [name], MD [facility]

## 2016-10-22 NOTE — Patient Instructions (Addendum)
1. Recurrent infections - with isolated low IgG - Immunoglobulin replacement was rejected but we will keep it on the back burner for now.  - It seems that the Nucala may have helped decrease the incidence of infections as well.   2. Asthma-COPD overlap syndrome - Lung function looked stable today. - Daily controller medication(s): Symbicort 160/4.5 two puffs twice daily with spacer - Rescue medications: ProAir 4 puffs every 4-6 hours as needed or albuterol nebulizer one vial puffs every 4-6 hours as needed - Asthma control goals:  * Full participation in all desired activities (may need albuterol before activity) * Albuterol use two time or less a week on average (not counting use with activity) * Cough interfering with sleep two time or less a month * Oral steroids no more than once a year * No hospitalizations  3. Chronic allergic rhinitis (sweet vernal grass, box elder, cat, weeds, ragweed, molds, cockroach, dust mite) - Continue with Dymista 2 sprays per nostril 1-2 times daily. - Continue with saline mist 1-2 times daily.  - Use cetirizine 10mg  daily as needed for breakthrough symptoms. - Information provided on allergy shots.  4. Obstructive sleep apnea  - Talk to Dr. Creig Hines about changing your mask.  -  Start Afrin one spray per nostril nightly to help open your nose up.   5. Oral candidiasis - Start fluconazole (100mg ) one tablet today and then one tablet (100mg ) in one week. - Be sure to rinse well when you use your Symbicort.    6. Return in about 3 months (around 01/22/2017).  Please inform us of any Emergency Department visits, hospitalizations, or changes in symptoms. Call us before going to the ED for breathing or allergy symptoms since we might be able to fit you in for a sick visit. Feel free to contact us anytime with any questions, problems, or concerns.  It was a pleasure to see you again today! Happy spring!    Websites that have reliable patient  information: 1. American Academy of Asthma, Allergy, and Immunology: www.aaaai.org 2. Food Allergy Research and Education (FARE): foodallergy.org 3. Mothers of Asthmatics: http://www.asthmacommunitynetwork.org 4. American College of Allergy, Asthma, and Immunology: www.acaai.org

## 2016-10-23 NOTE — Progress Notes (Signed)
Vials to be made 10-23-16  jm

## 2016-10-29 DIAGNOSIS — H1859 Other hereditary corneal dystrophies: Secondary | ICD-10-CM | POA: Diagnosis not present

## 2016-10-30 DIAGNOSIS — M4726 Other spondylosis with radiculopathy, lumbar region: Secondary | ICD-10-CM | POA: Diagnosis not present

## 2016-10-30 DIAGNOSIS — G43709 Chronic migraine without aura, not intractable, without status migrainosus: Secondary | ICD-10-CM | POA: Insufficient documentation

## 2016-10-30 DIAGNOSIS — G43009 Migraine without aura, not intractable, without status migrainosus: Secondary | ICD-10-CM | POA: Diagnosis not present

## 2016-10-30 DIAGNOSIS — J301 Allergic rhinitis due to pollen: Secondary | ICD-10-CM | POA: Diagnosis not present

## 2016-10-30 DIAGNOSIS — Z9181 History of falling: Secondary | ICD-10-CM | POA: Diagnosis not present

## 2016-10-30 DIAGNOSIS — M5416 Radiculopathy, lumbar region: Secondary | ICD-10-CM | POA: Diagnosis not present

## 2016-10-31 ENCOUNTER — Ambulatory Visit: Payer: BLUE CROSS/BLUE SHIELD | Admitting: Pulmonary Disease

## 2016-10-31 DIAGNOSIS — J3089 Other allergic rhinitis: Secondary | ICD-10-CM | POA: Diagnosis not present

## 2016-11-03 DIAGNOSIS — G4733 Obstructive sleep apnea (adult) (pediatric): Secondary | ICD-10-CM | POA: Diagnosis not present

## 2016-11-12 ENCOUNTER — Encounter (INDEPENDENT_AMBULATORY_CARE_PROVIDER_SITE_OTHER): Payer: Self-pay | Admitting: Orthopaedic Surgery

## 2016-11-12 ENCOUNTER — Ambulatory Visit (INDEPENDENT_AMBULATORY_CARE_PROVIDER_SITE_OTHER): Payer: BLUE CROSS/BLUE SHIELD | Admitting: Orthopaedic Surgery

## 2016-11-12 DIAGNOSIS — M25562 Pain in left knee: Secondary | ICD-10-CM

## 2016-11-12 DIAGNOSIS — S83242D Other tear of medial meniscus, current injury, left knee, subsequent encounter: Secondary | ICD-10-CM | POA: Diagnosis not present

## 2016-11-12 DIAGNOSIS — G8929 Other chronic pain: Secondary | ICD-10-CM

## 2016-11-12 DIAGNOSIS — M25561 Pain in right knee: Secondary | ICD-10-CM

## 2016-11-12 MED ORDER — METHYLPREDNISOLONE 4 MG PO TABS: ORAL_TABLET | ORAL | 0 refills | Status: DC

## 2016-11-12 MED ORDER — LIDOCAINE HCL 1 % IJ SOLN
3.0000 mL | INTRAMUSCULAR | Status: AC | PRN
  Administered 2016-11-12: 3 mL

## 2016-11-12 MED ORDER — METHYLPREDNISOLONE ACETATE 40 MG/ML IJ SUSP
40.0000 mg | INTRAMUSCULAR | Status: AC | PRN
Start: 2016-11-12 — End: 2016-11-12
  Administered 2016-11-12: 40 mg via INTRA_ARTICULAR

## 2016-11-12 NOTE — Progress Notes (Signed)
Office Visit Note   Patient: Mark Harrell           Date of Birth: 06/18/1960           MRN: [id number] Visit Date: 11/12/2016              Requested by: [name], [address], [place] [id number] PCP: [name], MD   Assessment & Plan: Visit Diagnoses:  1. Chronic pain of left knee   2. Acute medial meniscal tear, left, subsequent encounter   3. Acute pain of right knee     Plan: Since his health or medical status had improved he does wish to proceed with a left knee arthroscopy with a partial medial meniscectomy. We have talked about this before and the risk and benefits have been explained in detail. This is something I would do and whether Main hospitals with the potential for staying overnight given his asthma and COPD. I went over a knee modeling spent in detail again with the surgery involves and he does wish proceed. We would see him back in one week postoperative for suture removal. Also injected his right knee with a steroid and lidocaine to help with pain with that knee per his request.  Follow-Up Instructions: Return for 1 week post-op.   Orders:  No orders of the defined types were placed in this encounter.  Meds ordered this encounter  Medications  . methylPREDNISolone (MEDROL) 4 MG tablet    Sig: Medrol dose pack. Take as instructed    Dispense:  21 tablet    Refill:  0      Procedures: Large Joint Inj Date/Time: 11/12/2016 8:13 PM Performed by: Mcarthur Rossetti Authorized by: Mcarthur Rossetti   Location:  Knee Site:  R knee Ultrasound Guidance: No   Fluoroscopic Guidance: No   Arthrogram: No   Medications:  3 mL lidocaine 1 %; 40 mg methylPREDNISolone acetate 40 MG/ML     Clinical Data: No additional findings.   Subjective: Chief Complaint  Patient presents with  . Left Knee - Pain  . Left Hip - Pain  . Right Hip - Pain  . Right Knee - Pain  The patient is well-known to [place]. He was  actually set up for a left knee arthroscopy for a medial meniscectomy but had to cancel her surgery due to health reasons. He is quite sick individual and is been hospitalized several times but is back to feeling better. He still and visit with a cane and has significant problems with left knee pain. He said that's contributed him having now left hip pain right hip pain in right knee pain. He does feel that he is at the point where he can proceed with surgery on his left knee which we again had recommended a partial medial meniscectomy. He would like to have a steroid injection in his right knee. He is someone who is significantly obese and with counseled him about weight loss and activity modification as well. Again we had MRI already of his knee showing the extent of meniscal tear and he's had multiple attempts at nonoperative conservative treatment including rest, ice, heat, walking with a cane, and multiple steroid injections.  HPI  Review of Systems He currently denies any headache, chest pain, short of breath, fever, chills, nausea, vomiting.  Objective: Vital Signs: There were no vitals taken for this visit.  Physical Exam He is alert and oriented 3 and in no acute distress Ortho  Exam He is angling slow with a cane. He is moderately to markedly obese. Neither knee has an effusion but the left knee is quite painful to positive Murray sign on the medial compartment. Right knee has some patellofemoral crepitation but is otherwise stable ligamentously and has a normal exam other than pain. Both hips have fluid range of motion. Specialty Comments:  No specialty comments available.  Imaging: No results found.   PMFS History: Patient Active Problem List   Diagnosis Date Noted  . Irritant contact dermatitis due to frequent handwashing 06/08/2016  . Sepsis ([place]) 06/03/2016  . Nausea vomiting and diarrhea 06/03/2016  . Fever 06/03/2016  . Acute asthma exacerbation 05/30/2016  . Acute  exacerbation of COPD with asthma ([place]) 05/29/2016  . COPD mixed type ([place]) 05/29/2016  . DM (diabetes mellitus) ([place]) 05/29/2016  . Acute medial meniscal tear, left, subsequent encounter 04/23/2016  . Recurrent infections 03/29/2016  . Chronic nonseasonal allergic rhinitis due to pollen 03/29/2016  . Glucose intolerance (impaired glucose tolerance) 01/16/2016  . Polypharmacy 01/16/2016  . BMI 47.46,   adult ([place]) 03/06/2015  . SDAT 02/05/2015  . Asthma-COPD overlap syndrome ([place]) 02/05/2015  . OSA (obstructive sleep apnea) 02/05/2015  . Medication management 08/02/2014  . GERD (gastroesophageal reflux disease) 05/09/2014  . Obesity, Class III, BMI 40-49.9 (morbid obesity) ([place]) 02/01/2014  . Vitamin D deficiency 08/01/2013  . Prediabetes 08/01/2013  . 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 Diverticula of colon 05/07/2011  . Hypertension 01/31/2011  . Hyperlipidemia 01/31/2011  . Migraine 01/31/2011  . BPH (benign prostatic hyperplasia) 01/31/2011  . Testosterone Deficiency 01/31/2011  . IBS (irritable bowel syndrome) 01/31/2011  . Partial complex seizure disorder with intractable epilepsy ([place]) 01/31/2011  . Depression, controlled 01/31/2011  . COPD with asthma ([place]) 01/31/2011   Past Medical History:  Diagnosis Date  . Anesthesia complication requiring reversal agent administration    ? from central apnea, very difficult to get off vent  . Arthritis    osteo  . Asthma   . BPH (benign prostatic hyperplasia)   . Complication of anesthesia    difficulty waking , they twlight me because of my respiratory problems "  . Depression    on zoloft for depression  . GERD (gastroesophageal reflux disease)   . Headache    botox injections for headaches  . Hyperlipidemia   . Hypertension   . Hypogonadism male   . IBS (irritable bowel syndrome)   . Obesity   . OSA (obstructive sleep apnea)   . Pre-diabetes   . Prostatitis     Family History  Problem Relation Age of Onset  . Diabetes  Paternal Uncle   . Cancer Father        lymphoma, colon  . Diabetes Maternal Grandmother   . Heart disease Maternal Grandfather   . Diabetes Maternal Grandfather   . Diabetes Paternal Grandmother   . Diabetes Paternal Grandfather   . Dementia Mother   . Prostate cancer Maternal Uncle   . Lung disease Neg Hx   . Rheumatologic disease Neg Hx     Past Surgical History:  Procedure Laterality Date  . ABDOMINAL SURGERY    . ANKLE FRACTURE SURGERY Right   . CYSTOSCOPY     [place]  . TONSILLECTOMY    . [place] RESECTION  2007  . UVULOPALATOPHARYNGOPLASTY     Social History   Occupational History  . Dance movement psychotherapist    Social History Main Topics  . Smoking status: Former Smoker  Packs/day: 0.10    Years: 15.00    Start date: 12/14/1980    Quit date: 05/27/1995  . Smokeless tobacco: Never Used     Comment: significant second-hand exposure through mother  . Alcohol use Yes     Comment: rarely  . Drug use: No  . Sexual activity: No

## 2016-11-13 ENCOUNTER — Encounter: Payer: Self-pay | Admitting: Podiatry

## 2016-11-13 ENCOUNTER — Ambulatory Visit (INDEPENDENT_AMBULATORY_CARE_PROVIDER_SITE_OTHER): Payer: BLUE CROSS/BLUE SHIELD | Admitting: Podiatry

## 2016-11-13 VITALS — BP 142/82 | HR 83 | Resp 16

## 2016-11-13 DIAGNOSIS — B351 Tinea unguium: Secondary | ICD-10-CM

## 2016-11-13 DIAGNOSIS — Q828 Other specified congenital malformations of skin: Secondary | ICD-10-CM | POA: Diagnosis not present

## 2016-11-13 DIAGNOSIS — R51 Headache: Secondary | ICD-10-CM

## 2016-11-13 DIAGNOSIS — M79676 Pain in unspecified toe(s): Secondary | ICD-10-CM | POA: Diagnosis not present

## 2016-11-13 DIAGNOSIS — G629 Polyneuropathy, unspecified: Secondary | ICD-10-CM | POA: Diagnosis not present

## 2016-11-13 NOTE — Progress Notes (Signed)
   Subjective:    Patient ID: Mark Harrell, male    DOB: June 02, 1960, 56 y.o.   MRN: [id number]  HPI: He presents today concerned about possible ingrown toenails to the hallux bilaterally. He states that they're occasionally tender. He's concerned because his primary care provider told him that he may have fungus. He does state that he is prediabetic and he has dry skin bilaterally. He's also been told that he has early neuropathy.  Review of Systems  Constitutional: Positive for diaphoresis and fatigue.  HENT: Positive for sinus pressure and tinnitus.   Genitourinary: Positive for difficulty urinating, frequency and urgency.  Musculoskeletal: Positive for arthralgias, back pain and gait problem.  Neurological: Positive for numbness.  All other systems reviewed and are negative.      Objective:   Physical Exam: Vital signs are stable alert and oriented 3. Pulses are strongly palpable. Neurologic sensorium is intact. Deep tendon reflexes are intact. Muscle strength is normal symmetrical bilateral. He has dry scaly skin but no signs of ingrown toenails and onychomycosis.        Assessment & Plan:  Early diabetic peripheral neuropathy possibly autonomic neuropathy. No other major findings.

## 2016-11-14 DIAGNOSIS — J4550 Severe persistent asthma, uncomplicated: Secondary | ICD-10-CM | POA: Diagnosis not present

## 2016-11-19 ENCOUNTER — Ambulatory Visit (INDEPENDENT_AMBULATORY_CARE_PROVIDER_SITE_OTHER): Payer: BLUE CROSS/BLUE SHIELD

## 2016-11-19 DIAGNOSIS — J4550 Severe persistent asthma, uncomplicated: Secondary | ICD-10-CM

## 2016-11-24 ENCOUNTER — Ambulatory Visit (INDEPENDENT_AMBULATORY_CARE_PROVIDER_SITE_OTHER): Payer: BLUE CROSS/BLUE SHIELD

## 2016-11-24 DIAGNOSIS — J309 Allergic rhinitis, unspecified: Secondary | ICD-10-CM | POA: Diagnosis not present

## 2016-11-24 MED ORDER — EPINEPHRINE 0.3 MG/0.3ML IJ SOAJ: 0.3000 mg | Freq: Once | INTRAMUSCULAR | 2 refills | Status: AC

## 2016-11-24 NOTE — Progress Notes (Signed)
Immunotherapy   Patient Details  Name: Mark Harrell MRN: [id number] Date of Birth: 11/10/1960  11/24/2016  Mark Harrell started injections for  allergy injections Following schedule: A  Frequency: 1-2 times weekly Epi-Pen:Prescription for Epi-Pen given Consent signed and patient instructions given. Pt waited for 30 minutes with no issues.    Amber Wood 11/24/2016, 3:27 PM

## 2016-11-28 ENCOUNTER — Encounter: Payer: Self-pay | Admitting: Pulmonary Disease

## 2016-11-28 ENCOUNTER — Ambulatory Visit (INDEPENDENT_AMBULATORY_CARE_PROVIDER_SITE_OTHER): Payer: BLUE CROSS/BLUE SHIELD

## 2016-11-28 ENCOUNTER — Ambulatory Visit (INDEPENDENT_AMBULATORY_CARE_PROVIDER_SITE_OTHER): Payer: BLUE CROSS/BLUE SHIELD | Admitting: Pulmonary Disease

## 2016-11-28 VITALS — BP 102/70 | HR 86 | Ht 72.0 in | Wt 364.2 lb

## 2016-11-28 DIAGNOSIS — J449 Chronic obstructive pulmonary disease, unspecified: Secondary | ICD-10-CM | POA: Diagnosis not present

## 2016-11-28 DIAGNOSIS — Z9989 Dependence on other enabling machines and devices: Secondary | ICD-10-CM | POA: Diagnosis not present

## 2016-11-28 DIAGNOSIS — J309 Allergic rhinitis, unspecified: Secondary | ICD-10-CM

## 2016-11-28 DIAGNOSIS — G4733 Obstructive sleep apnea (adult) (pediatric): Secondary | ICD-10-CM | POA: Diagnosis not present

## 2016-11-28 DIAGNOSIS — K219 Gastro-esophageal reflux disease without esophagitis: Secondary | ICD-10-CM

## 2016-11-28 NOTE — Progress Notes (Signed)
Subjective:    Patient ID: Mark Harrell, male    DOB: 1961-02-08, 56 y.o.   MRN: [id number]  C.C.:  Follow-up COPD Mixed Type, OSA, GERD, & Chronic Allergic Rhinitis.   HPI Mixed type COPD: Prescribed Singulair and Symbicort. He denies any exacerbations since last appointment. He reports he has been very inactive since last appointment with sepsis and other medical problems as well as his knee pain. He does report some dyspnea on exertion that may be slightly worse as well. He reports only "occasional" coughing or wheezing. No requirement for his rescue medication.   GERD: Previously patient on Pepcid but transitioned to Nexium at last appointment. Does have a known small sliding type hiatal hernia. He is still taking Nexium. He is also using antacids and Pepto-Bismol as well. He is still having intermittent reflux. Following with GI.   OSA: Recommended adherence to CPAP therapy at last appointment. Prescribed AutoPap at 10-18 centimeter H2O pressure. He reports he finds he is breathing more through his mouth and has had trouble getting a good mask fit. He has been trying to find a chin strap to use. He is using a full face mask but still feels like his mouth is drying out. With increasing the humidity it feels too hot. He is not using heated tubing right now per his report.   Chronic allergic rhinitis: Followed by allergy/immunology (Dr. [name]). Previously prescribed Singulair, Zyrtec, and at last appointment had not yet started Nucala. Since last appointment he has been using Dymista and saline rinses with Afrin as well. This seems to have improved his sinus congestion & pressure.   Review of Systems  No chest pain or pressure. No fever or chills. He continues to have significant fatigue and malaise. He continues to have intermittent stomach discomfort and frequent diarrhea.   Allergies  Allergen Reactions  . Bee Venom Swelling  . Ppd [Tuberculin Purified Protein Derivative] Other (See  Comments)    +ppd NEG Quantferron Gold 3/13  . Fenofibrate Other (See Comments)    Back pain  . Levofloxacin Diarrhea  . Other Other (See Comments)    Some antibiotics cause diarrhea  . Verapamil Other (See Comments)    Back pain  . Claritin [Loratadine] Other (See Comments)  . Cymbalta [Duloxetine Hcl] Other (See Comments)    unknown    Current Outpatient Prescriptions on File Prior to Visit  Medication Sig Dispense Refill  . albuterol (PROAIR HFA) 108 (90 Base) MCG/ACT inhaler INHALE 2 PUFFS INTO THE LUNGS EVERY 4 (FOUR) HOURS AS NEEDED FOR WHEEZING OR SHORTNESS OF BREATH. 8.5 each 3  . albuterol (PROVENTIL) (2.5 MG/3ML) 0.083% nebulizer solution Inhale 2.5 mg into the lungs every 4 (four) hours as needed.  12  . alfuzosin (UROXATRAL) 10 MG 24 hr tablet Take 10 mg by mouth daily.      . baclofen (LIORESAL) 10 MG tablet Take 10 mg by mouth 3 (three) times daily.     . bisoprolol-hydrochlorothiazide (ZIAC) 5-6.25 MG tablet Take 1 tablet by mouth daily. 90 tablet 0  . budesonide-formoterol (SYMBICORT) 160-4.5 MCG/ACT inhaler Inhale 2 puffs into the lungs 2 (two) times daily. 1 Inhaler 5  . Calcium Carbonate-Vitamin D (CALCIUM + D PO) Take 500 mg by mouth 2 (two) times daily.     . cetaphil (CETAPHIL) lotion Apply topically 2 (two) times daily. 236 mL 0  . cetirizine (ZYRTEC) 10 MG tablet Take 10 mg by mouth daily.    . Cholecalciferol (VITAMIN D PO)  Take 10,000-20,000 Units by mouth 2 (two) times daily. 20000 in the morning and 10000 in the evening    . Cinnamon 500 MG capsule Take 500 mg by mouth 2 (two) times daily.     . cyclobenzaprine (FLEXERIL) 10 MG tablet Take 10 mg by mouth 3 (three) times daily as needed for muscle spasms.    [name] donepezil (ARICEPT) 23 MG TABS tablet Take 23 mg by mouth at bedtime.    . DYMISTA 137-50 MCG/ACT SUSP PLACE 2 SPRAYS INTO BOTH NOSTRILS 2 (TWO) TIMES DAILY.  5  . esomeprazole (NEXIUM) 20 MG capsule Take 20 mg by mouth daily.  1  . gabapentin  (NEURONTIN) 600 MG tablet 1,800 mg 3 (three) times daily.     [name] guaiFENesin (MUCINEX) 600 MG 12 hr tablet Take 600 mg by mouth 2 (two) times daily.     [name] l-methylfolate-B6-B12 (METANX) 3-35-2 MG TABS tablet Take 1 tablet by mouth daily.  3  . memantine (NAMENDA) 10 MG tablet Take 10 mg by mouth 2 (two) times daily.     . Methylfol-Algae-B12-Acetylcyst (CEREFOLIN NAC) 6-90.314-2-600 MG TABS Take 1 tablet by mouth daily.     . Misc Natural Products (TURMERIC CURCUMIN) CAPS Take 1 capsule by mouth 2 (two) times daily.    . montelukast (SINGULAIR) 10 MG tablet TAKE 1 TABLET (10 MG TOTAL) BY MOUTH DAILY. 90 tablet 1  . Multiple Vitamins-Minerals (MULTIVITAMIN WITH MINERALS) tablet Take 1 tablet by mouth daily.    . Omega-3 Krill Oil 1000 MG CAPS Take 1,000 mg by mouth 2 (two) times daily.    . ondansetron (ZOFRAN ODT) 4 MG disintegrating tablet Take 1 tablet (4 mg total) by mouth every 6 (six) hours as needed for nausea or vomiting. 30 tablet 0  . oxyCODONE-acetaminophen (PERCOCET) 10-325 MG tablet Take 1 tablet by mouth every 4 (four) hours as needed for pain.    . pentosan polysulfate (ELMIRON) 100 MG capsule Take 200 mg by mouth 3 (three) times daily before meals.     . Phosphatidylserine-DHA-EPA (VAYACOG PO) Take 1 capsule by mouth 2 (two) times daily.    [name] POTASSIUM PO Take 1 tablet by mouth daily.    . pravastatin (PRAVACHOL) 40 MG tablet TAKE 1 TABLET BY MOUTH AT BEDTIME 90 tablet 2  . sertraline (ZOLOFT) 50 MG tablet Take 50 mg by mouth 2 (two) times daily.     . tadalafil (CIALIS) 5 MG tablet Take 5 mg by mouth every evening.     . triamcinolone cream (KENALOG) 0.1 % APPLY TOPICALLY 4 TIMES A DAY (Patient taking differently: APPLY TOPICALLY 2 TIMES A DAY) 30 g 1  . desmopressin (DDAVP) 0.2 MG tablet Take 200 mcg by mouth at bedtime.  3  . methylPREDNISolone (MEDROL) 4 MG tablet Medrol dose pack. Take as instructed (Patient not taking: Reported on 11/28/2016) 21 tablet 0  . trospium (SANCTURA)  20 MG tablet 20 mg 2 (two) times daily.   2   Current Facility-Administered Medications on File Prior to Visit  Medication Dose Route Frequency Provider Last Rate Last Dose  . Mepolizumab SOLR 100 mg  100 mg Subcutaneous Q28 days [name], MD   100 mg at 11/19/16 1453    Past Medical History:  Diagnosis Date  . Anesthesia complication requiring reversal agent administration    ? from central apnea, very difficult to get off vent  . Arthritis    osteo  . Asthma   . BPH (benign prostatic hyperplasia)   .  Complication of anesthesia    difficulty waking , they twlight me because of my respiratory problems "  . Depression    on zoloft for depression  . GERD (gastroesophageal reflux disease)   . Headache    botox injections for headaches  . Hyperlipidemia   . Hypertension   . Hypogonadism male   . IBS (irritable bowel syndrome)   . Obesity   . OSA (obstructive sleep apnea)   . Pre-diabetes   . Prostatitis     Past Surgical History:  Procedure Laterality Date  . ABDOMINAL SURGERY    . ANKLE FRACTURE SURGERY Right   . CYSTOSCOPY     [place]  . TONSILLECTOMY    . [place] RESECTION  2007  . UVULOPALATOPHARYNGOPLASTY      Family History  Problem Relation Age of Onset  . Diabetes Paternal Uncle   . Cancer Father        lymphoma, colon  . Diabetes Maternal Grandmother   . Heart disease Maternal Grandfather   . Diabetes Maternal Grandfather   . Diabetes Paternal Grandmother   . Diabetes Paternal Grandfather   . Dementia Mother   . Prostate cancer Maternal Uncle   . Lung disease Neg Hx   . Rheumatologic disease Neg Hx     Social History   Social History  . Marital status: Single    Spouse name: N/A  . Number of children: N/A  . Years of education: N/A   Occupational History  . Dance movement psychotherapist    Social History Main Topics  . Smoking status: Former Smoker    Packs/day: 0.10    Years: 15.00    Start date: 12/14/1980    Quit date: 05/27/1995    . Smokeless tobacco: Never Used     Comment: significant second-hand exposure through mother  . Alcohol use Yes     Comment: rarely  . Drug use: No  . Sexual activity: No   Other Topics Concern  . None   Social History Narrative   [facility]:   Originally from [place]. Previously has lived in [place]. He has lived in [place], [place], & [place]. He has worked in Engineer, production. No pets currently. Brief exposure to a roommates bird Secretary/administrator) in college. No mold, asbestos, or hot tub exposure.       Objective:   Physical Exam BP 102/70 (BP Location: Right Arm, Patient Position: Sitting, Cuff Size: Large)   Pulse 86   Ht 6' (1.829 m)   Wt (!) 364 lb 3.2 oz (165.2 kg)   SpO2 93%   BMI 49.39 kg/m   General:  Awake. Alert. No distress. Morbidly obese. Integument:  Warm & dry. No rash on exposed skin.  Extremities:  No cyanosis or clubbing.  HEENT:  Moist mucus membranes. No oral ulcers. No significant nasal turbinate swelling. No scleral icterus. Cardiovascular:  Regular rate. Trace edema. No appreciable JVD.  Pulmonary:  Clear bilaterally to auscultation. Good aeration bilaterally. Normal work of breathing on room air. Abdomen: Soft. Normal bowel sounds. Protuberant. Musculoskeletal:  Normal bulk and tone. No joint deformity or effusion appreciated.  PFT 04/21/16: FVC 3.47 L (66%) FEV1 2.58 L (64%) FEV1/FVC 0.74 FEF 25-75 1.95 L (57%) negative bronchodilator response TLC 6.21 L (84%) RV 96% ERV 15% DLCO uncorrected 95% 03/28/16: FVC 4.03 L (80%) FEV1 2.80 L (69%) FEV1/FVC 0.69 FEF 25-75 1.62 L (40%) negative bronchodilator response  6MWT 05/08/16:  Walked 321 meters / Baseline Sat 93% on RA /  Nadir Sat 93% on RA @ rest  COMPLIANCE DOWNLOAD 06/03 - 11/24/16:  7% compliance with >/= 4 hours of use. Used 5 days in last 30. Residual AHI 3.2 events/hr. Maximum pressure 12.8 cm H2O.   SPLIT NIGHT SLEEP STUDY (05/07/16): Severe obstructive sleep apnea with AHI  65.7 events/hour. No significant central sleep apnea occurred. Severe desaturation during diagnostic portion to 72% on room air. No cardiac abnormalities or periodic limb movements were noted. Optimal CPAP pressure was 18 cm H2O with a large-sized Fisher[employer]Paykel Full Face Mask Simplus mask. Reading physician recommended AutoPap at 10-18 cm H2O pressure.  IMAGING ESOPHAGRAM/BARIUM SWALLOW 04/15/16 (per radiologist): Mild nonspecific esophageal dysmotility disorder. Episode of flash laryngeal penetration with swallowing. Small sliding type I hiatal hernia. No visible mass, erosion, or ulcerations.  CXR PA/LAT 02/13/16 (previously reviewed by me): No focal opacity or effusion appreciated. No mass appreciated. Heart borderline normal in size. Mediastinum normal in contour. Low lung volumes.  LABS 04/10/16 Alpha-1 antitrypsin: MM (135)  03/28/16 CBC: 11.8/14.2/43.0/309 Eosinophils:  354 IgG: 583 IgA: 154 IgM: 46 IgE: 92  11/02/13 ANA:  Negative DS DNA Ab:  <1 SSA:  <1.0 SSB:  <1.0  02/24/11 ACE:  35 RF:  <10    Assessment & Plan:  56 y.o. male with mixed type COPD, OSA, GERD, and chronic allergic rhinitis. I reviewed the patient's CPAP compliance download which shows excellent treatment with minimal residual AHI when he is able to use his CPAP. He is having intermittent leaks as well. I believe this stems from his difficulty tolerating his mask for the reasons noted above. Overall, his COPD seems to be well-controlled and with improvement in control of his chronic allergic rhinitis my hope is that with certain adjustments noted below we will be able to improve his tolerance of CPAP therapy. I instructed the patient to contact my office if he had any new breathing problems or questions before his next appointment.  1. Mixed type COPD: Continuing Singulair and Symbicort. No changes. 2. OSA: Trying a chin strap & heated tubing for humidification to increase tolerance. If this continues to be an  issue patient may need to try an oral appliance which I believe would incompletely treat the severity of his OSA. 3. GERD: Defer to GI on adjusting his medication regimen with upcoming appointment. 4. Chronic allergic rhinitis: Improved symptoms with his current intranasal regimen of saline, Dymista,  and intermittent Afrin. Continuing to follow with allergy.  5. Health maintenance: Status post influenza vaccine October 2017, Pneumovax November 2017, [place] June 2016, & Tdap Harrell 2015. 6. Follow-up: Return to clinic in  6 months or sooner if needed.   Sonia Baller Ashok Cordia, M.D. [facility] Pager:  [phone] After 3pm or if no response, call [phone] 3:22 PM 11/28/16

## 2016-11-28 NOTE — Patient Instructions (Addendum)
   We are going to send in an order to [facility] for you to try a chin strap and heated tubing with your CPAP.   Let me know if this doesn't help your tolerance of your CPAP/mask.  We are keeping your breathing medicines the same.  Let me know if you have any new breathing problems or questions before your next appointment.

## 2016-12-03 DIAGNOSIS — K648 Other hemorrhoids: Secondary | ICD-10-CM | POA: Insufficient documentation

## 2016-12-03 DIAGNOSIS — R197 Diarrhea, unspecified: Secondary | ICD-10-CM | POA: Insufficient documentation

## 2016-12-03 DIAGNOSIS — G4733 Obstructive sleep apnea (adult) (pediatric): Secondary | ICD-10-CM | POA: Diagnosis not present

## 2016-12-05 ENCOUNTER — Ambulatory Visit (INDEPENDENT_AMBULATORY_CARE_PROVIDER_SITE_OTHER): Payer: BLUE CROSS/BLUE SHIELD

## 2016-12-05 DIAGNOSIS — R413 Other amnesia: Secondary | ICD-10-CM | POA: Insufficient documentation

## 2016-12-05 DIAGNOSIS — J309 Allergic rhinitis, unspecified: Secondary | ICD-10-CM | POA: Diagnosis not present

## 2016-12-12 ENCOUNTER — Ambulatory Visit (INDEPENDENT_AMBULATORY_CARE_PROVIDER_SITE_OTHER): Payer: BLUE CROSS/BLUE SHIELD

## 2016-12-12 DIAGNOSIS — J309 Allergic rhinitis, unspecified: Secondary | ICD-10-CM | POA: Diagnosis not present

## 2016-12-15 DIAGNOSIS — J4550 Severe persistent asthma, uncomplicated: Secondary | ICD-10-CM | POA: Diagnosis not present

## 2016-12-16 ENCOUNTER — Ambulatory Visit (INDEPENDENT_AMBULATORY_CARE_PROVIDER_SITE_OTHER): Payer: BLUE CROSS/BLUE SHIELD | Admitting: *Deleted

## 2016-12-16 DIAGNOSIS — J309 Allergic rhinitis, unspecified: Secondary | ICD-10-CM

## 2016-12-18 ENCOUNTER — Ambulatory Visit (INDEPENDENT_AMBULATORY_CARE_PROVIDER_SITE_OTHER): Payer: BLUE CROSS/BLUE SHIELD | Admitting: *Deleted

## 2016-12-18 DIAGNOSIS — J4550 Severe persistent asthma, uncomplicated: Secondary | ICD-10-CM

## 2016-12-22 NOTE — Progress Notes (Signed)
Please place orders in EPIC as patient is being scheduled for a pre-op appointment! Thank you! 

## 2016-12-23 ENCOUNTER — Other Ambulatory Visit (INDEPENDENT_AMBULATORY_CARE_PROVIDER_SITE_OTHER): Payer: Self-pay | Admitting: Physician Assistant

## 2016-12-23 DIAGNOSIS — M4726 Other spondylosis with radiculopathy, lumbar region: Secondary | ICD-10-CM | POA: Diagnosis not present

## 2016-12-25 NOTE — Patient Instructions (Addendum)
Mark Harrell  12/25/2016   Your procedure is scheduled on: 01/02/17   Report to [facility] Main  Entrance   Report to admitting at      940AM   Call this number if you have problems the morning of surgery   [phone]   Remember: ONLY 1 PERSON MAY GO WITH YOU TO SHORT STAY TO GET  READY MORNING OF YOUR SURGERY.  Do not eat food or drink liquids :After Midnight.     Take these medicines the morning of surgery with A SIP OF WATER: inhalers and bring, zyrtec, nexium, gabapentin, singulair, zoloft                                You may not have any metal on your body including hair pins and              piercings  Do not wear jewelry lotions, powders or perfumes, deodorant                       Men may shave face and neck.   Do not bring valuables to the hospital. [place].  Contacts, dentures or bridgework may not be worn into surgery.      Patients discharged the day of surgery will not be allowed to drive home.  Name and phone number of your driver:  Special Instructions: N/A              Please read over the following fact sheets you were given: _____________________________________________________________________             [facility] - Preparing for Surgery Before surgery, you can play an important role.  Because skin is not sterile, your skin needs to be as free of germs as possible.  You can reduce the number of germs on your skin by washing with CHG (chlorahexidine gluconate) soap before surgery.  CHG is an antiseptic cleaner which kills germs and bonds with the skin to continue killing germs even after washing. Please DO NOT use if you have an allergy to CHG or antibacterial soaps.  If your skin becomes reddened/irritated stop using the CHG and inform your nurse when you arrive at Short Stay. Do not shave (including legs and underarms) for at least 48 hours prior to the first CHG shower.  You  may shave your face/neck. Please follow these instructions carefully:  1.  Shower with CHG Soap the night before surgery and the  morning of Surgery.  2.  If you choose to wash your hair, wash your hair first as usual with your  normal  shampoo.  3.  After you shampoo, rinse your hair and body thoroughly to remove the  shampoo.                           4.  Use CHG as you would any other liquid soap.  You can apply chg directly  to the skin and wash                       Gently with a scrungie or clean washcloth.  5.  Apply the CHG  Soap to your body ONLY FROM THE NECK DOWN.   Do not use on face/ open                           Wound or open sores. Avoid contact with eyes, ears mouth and genitals (private parts).                       Wash face,  Genitals (private parts) with your normal soap.             6.  Wash thoroughly, paying special attention to the area where your surgery  will be performed.  7.  Thoroughly rinse your body with warm water from the neck down.  8.  DO NOT shower/wash with your normal soap after using and rinsing off  the CHG Soap.                9.  Pat yourself dry with a clean towel.            10.  Wear clean pajamas.            11.  Place clean sheets on your bed the night of your first shower and do not  sleep with pets. Day of Surgery : Do not apply any lotions/deodorants the morning of surgery.  Please wear clean clothes to the hospital/surgery center.  FAILURE TO FOLLOW THESE INSTRUCTIONS MAY RESULT IN THE CANCELLATION OF YOUR SURGERY PATIENT SIGNATURE_________________________________  NURSE SIGNATURE__________________________________  ________________________________________________________________________   Mark Harrell  An incentive spirometer is a tool that can help keep your lungs clear and active. This tool measures how well you are filling your lungs with each breath. Taking long deep breaths may help reverse or decrease the chance of  developing breathing (pulmonary) problems (especially infection) following:  A long period of time when you are unable to move or be active. BEFORE THE PROCEDURE   If the spirometer includes an indicator to show your best effort, your nurse or respiratory therapist will set it to a desired goal.  If possible, sit up straight or lean slightly forward. Try not to slouch.  Hold the incentive spirometer in an upright position. INSTRUCTIONS FOR USE  1. Sit on the edge of your bed if possible, or sit up as far as you can in bed or on a chair. 2. Hold the incentive spirometer in an upright position. 3. Breathe out normally. 4. Place the mouthpiece in your mouth and seal your lips tightly around it. 5. Breathe in slowly and as deeply as possible, raising the piston or the ball toward the top of the column. 6. Hold your breath for 3-5 seconds or for as long as possible. Allow the piston or ball to fall to the bottom of the column. 7. Remove the mouthpiece from your mouth and breathe out normally. 8. Rest for a few seconds and repeat Steps 1 through 7 at least 10 times every 1-2 hours when you are awake. Take your time and take a few normal breaths between deep breaths. 9. The spirometer may include an indicator to show your best effort. Use the indicator as a goal to work toward during each repetition. 10. After each set of 10 deep breaths, practice coughing to be sure your lungs are clear. If you have an incision (the cut made at the time of surgery), support your incision when coughing by placing a pillow or rolled up towels  firmly against it. Once you are able to get out of bed, walk around indoors and cough well. You may stop using the incentive spirometer when instructed by your caregiver.  RISKS AND COMPLICATIONS  Take your time so you do not get dizzy or light-headed.  If you are in pain, you may need to take or ask for pain medication before doing incentive spirometry. It is harder to take a  deep breath if you are having pain. AFTER USE  Rest and breathe slowly and easily.  It can be helpful to keep track of a log of your progress. Your caregiver can provide you with a simple table to help with this. If you are using the spirometer at home, follow these instructions: [place] IF:   You are having difficultly using the spirometer.  You have trouble using the spirometer as often as instructed.  Your pain medication is not giving enough relief while using the spirometer.  You develop fever of 100.5 F (38.1 C) or higher. SEEK IMMEDIATE MEDICAL CARE IF:   You cough up bloody sputum that had not been present before.  You develop fever of 102 F (38.9 C) or greater.  You develop worsening pain at or near the incision site. MAKE SURE YOU:   Understand these instructions.  Will watch your condition.  Will get help right away if you are not doing well or get worse. Document Released: 09/22/2006 Document Revised: 08/04/2011 Document Reviewed: 11/23/2006 [facility] Patient Information 2014 [place], [place].   ________________________________________________________________________

## 2016-12-26 ENCOUNTER — Encounter (HOSPITAL_COMMUNITY): Payer: Self-pay

## 2016-12-26 ENCOUNTER — Encounter (HOSPITAL_COMMUNITY)
Admission: RE | Admit: 2016-12-26 | Discharge: 2016-12-26 | Disposition: A | Discharge status: Home / Self Care | Payer: BLUE CROSS/BLUE SHIELD | Source: Ambulatory Visit | Attending: Orthopaedic Surgery | Admitting: Orthopaedic Surgery

## 2016-12-26 DIAGNOSIS — Z01812 Encounter for preprocedural laboratory examination: Secondary | ICD-10-CM | POA: Insufficient documentation

## 2016-12-26 DIAGNOSIS — X58XXXA Exposure to other specified factors, initial encounter: Secondary | ICD-10-CM | POA: Insufficient documentation

## 2016-12-26 DIAGNOSIS — S83242A Other tear of medial meniscus, current injury, left knee, initial encounter: Secondary | ICD-10-CM | POA: Diagnosis not present

## 2016-12-26 HISTORY — DX: Anxiety disorder, unspecified: F41.9

## 2016-12-26 HISTORY — DX: Dyspnea, unspecified: R06.00

## 2016-12-26 HISTORY — DX: Family history of other specified conditions: Z84.89

## 2016-12-26 HISTORY — DX: Cardiomegaly: I51.7

## 2016-12-26 HISTORY — DX: Pneumonia, unspecified organism: J18.9

## 2016-12-26 LAB — BASIC METABOLIC PANEL
Anion gap: 9 (ref 5–15)
BUN: 18 mg/dL (ref 6–20)
CHLORIDE: 103 mmol/L (ref 101–111)
CO2: 28 mmol/L (ref 22–32)
Calcium: 9 mg/dL (ref 8.9–10.3)
Creatinine, Ser: 0.72 mg/dL (ref 0.61–1.24)
GFR calc Af Amer: >60 mL/min (ref >60)
GFR calc non Af Amer: >60 mL/min (ref >60)
Glucose, Bld: 134 mg/dL — ABNORMAL HIGH (ref 65–99)
POTASSIUM: 3.9 mmol/L (ref 3.5–5.1)
SODIUM: 140 mmol/L (ref 135–145)

## 2016-12-26 LAB — CBC
HEMATOCRIT: 45.8 % (ref 39.0–52.0)
Hemoglobin: 15.1 g/dL (ref 13.0–17.0)
MCH: 29.4 pg (ref 26.0–34.0)
MCHC: 33 g/dL (ref 30.0–36.0)
MCV: 89.3 fL (ref 78.0–100.0)
Platelets: 274 10*3/uL (ref 150–400)
RBC: 5.13 MIL/uL (ref 4.22–5.81)
RDW: 13.7 % (ref 11.5–15.5)
WBC: 11.7 10*3/uL — AB (ref 4.0–10.5)

## 2016-12-26 NOTE — Progress Notes (Signed)
Cbc done 12/26/16 routed to Dr. [name] via epic.

## 2016-12-26 NOTE — Progress Notes (Signed)
10/02/16 ekg epic hgba1c 10/02/16 5.7 cxr 07/16/16

## 2016-12-30 ENCOUNTER — Ambulatory Visit (INDEPENDENT_AMBULATORY_CARE_PROVIDER_SITE_OTHER): Payer: BLUE CROSS/BLUE SHIELD | Admitting: *Deleted

## 2016-12-30 DIAGNOSIS — N3941 Urge incontinence: Secondary | ICD-10-CM | POA: Diagnosis not present

## 2016-12-30 DIAGNOSIS — J309 Allergic rhinitis, unspecified: Secondary | ICD-10-CM

## 2016-12-30 DIAGNOSIS — N3944 Nocturnal enuresis: Secondary | ICD-10-CM | POA: Diagnosis not present

## 2016-12-30 DIAGNOSIS — N3010 Interstitial cystitis (chronic) without hematuria: Secondary | ICD-10-CM | POA: Diagnosis not present

## 2016-12-30 DIAGNOSIS — E291 Testicular hypofunction: Secondary | ICD-10-CM | POA: Diagnosis not present

## 2016-12-30 DIAGNOSIS — N401 Enlarged prostate with lower urinary tract symptoms: Secondary | ICD-10-CM | POA: Diagnosis not present

## 2017-01-01 MED ORDER — DEXTROSE 5 % IV SOLN
3.0000 g | INTRAVENOUS | Status: AC
  Administered 2017-01-02: 3 g via INTRAVENOUS
  Filled 2017-01-01: qty 3

## 2017-01-02 ENCOUNTER — Ambulatory Visit (HOSPITAL_COMMUNITY): Payer: BLUE CROSS/BLUE SHIELD | Admitting: Registered Nurse

## 2017-01-02 ENCOUNTER — Ambulatory Visit (HOSPITAL_COMMUNITY)
Admission: RE | Admit: 2017-01-02 | Discharge: 2017-01-02 | Disposition: A | Discharge status: Home / Self Care | Payer: BLUE CROSS/BLUE SHIELD | Source: Ambulatory Visit | Attending: Orthopaedic Surgery | Admitting: Orthopaedic Surgery

## 2017-01-02 ENCOUNTER — Encounter (HOSPITAL_COMMUNITY): Payer: Self-pay | Admitting: *Deleted

## 2017-01-02 ENCOUNTER — Encounter (HOSPITAL_COMMUNITY)
Admission: RE | Disposition: A | Discharge status: Home / Self Care | Payer: Self-pay | Source: Ambulatory Visit | Attending: Orthopaedic Surgery

## 2017-01-02 DIAGNOSIS — M23232 Derangement of other medial meniscus due to old tear or injury, left knee: Secondary | ICD-10-CM | POA: Insufficient documentation

## 2017-01-02 DIAGNOSIS — I10 Essential (primary) hypertension: Secondary | ICD-10-CM | POA: Diagnosis not present

## 2017-01-02 DIAGNOSIS — G4733 Obstructive sleep apnea (adult) (pediatric): Secondary | ICD-10-CM | POA: Insufficient documentation

## 2017-01-02 DIAGNOSIS — Z833 Family history of diabetes mellitus: Secondary | ICD-10-CM | POA: Insufficient documentation

## 2017-01-02 DIAGNOSIS — K589 Irritable bowel syndrome without diarrhea: Secondary | ICD-10-CM | POA: Diagnosis not present

## 2017-01-02 DIAGNOSIS — J45909 Unspecified asthma, uncomplicated: Secondary | ICD-10-CM | POA: Diagnosis not present

## 2017-01-02 DIAGNOSIS — N400 Enlarged prostate without lower urinary tract symptoms: Secondary | ICD-10-CM | POA: Diagnosis not present

## 2017-01-02 DIAGNOSIS — Z887 Allergy status to serum and vaccine status: Secondary | ICD-10-CM | POA: Diagnosis not present

## 2017-01-02 DIAGNOSIS — Z8249 Family history of ischemic heart disease and other diseases of the circulatory system: Secondary | ICD-10-CM | POA: Insufficient documentation

## 2017-01-02 DIAGNOSIS — S83242A Other tear of medial meniscus, current injury, left knee, initial encounter: Secondary | ICD-10-CM | POA: Diagnosis not present

## 2017-01-02 DIAGNOSIS — E291 Testicular hypofunction: Secondary | ICD-10-CM | POA: Diagnosis not present

## 2017-01-02 DIAGNOSIS — R7303 Prediabetes: Secondary | ICD-10-CM | POA: Insufficient documentation

## 2017-01-02 DIAGNOSIS — Z8042 Family history of malignant neoplasm of prostate: Secondary | ICD-10-CM | POA: Diagnosis not present

## 2017-01-02 DIAGNOSIS — Z79899 Other long term (current) drug therapy: Secondary | ICD-10-CM | POA: Insufficient documentation

## 2017-01-02 DIAGNOSIS — F419 Anxiety disorder, unspecified: Secondary | ICD-10-CM | POA: Insufficient documentation

## 2017-01-02 DIAGNOSIS — E785 Hyperlipidemia, unspecified: Secondary | ICD-10-CM | POA: Diagnosis not present

## 2017-01-02 DIAGNOSIS — Z807 Family history of other malignant neoplasms of lymphoid, hematopoietic and related tissues: Secondary | ICD-10-CM | POA: Diagnosis not present

## 2017-01-02 DIAGNOSIS — Z6841 Body Mass Index (BMI) 40.0 and over, adult: Secondary | ICD-10-CM | POA: Diagnosis not present

## 2017-01-02 DIAGNOSIS — Z87891 Personal history of nicotine dependence: Secondary | ICD-10-CM | POA: Insufficient documentation

## 2017-01-02 DIAGNOSIS — N419 Inflammatory disease of prostate, unspecified: Secondary | ICD-10-CM | POA: Insufficient documentation

## 2017-01-02 DIAGNOSIS — M94262 Chondromalacia, left knee: Secondary | ICD-10-CM | POA: Diagnosis not present

## 2017-01-02 DIAGNOSIS — K219 Gastro-esophageal reflux disease without esophagitis: Secondary | ICD-10-CM | POA: Diagnosis not present

## 2017-01-02 DIAGNOSIS — S83242D Other tear of medial meniscus, current injury, left knee, subsequent encounter: Secondary | ICD-10-CM

## 2017-01-02 DIAGNOSIS — Z888 Allergy status to other drugs, medicaments and biological substances status: Secondary | ICD-10-CM | POA: Insufficient documentation

## 2017-01-02 DIAGNOSIS — I517 Cardiomegaly: Secondary | ICD-10-CM | POA: Insufficient documentation

## 2017-01-02 DIAGNOSIS — R0600 Dyspnea, unspecified: Secondary | ICD-10-CM | POA: Insufficient documentation

## 2017-01-02 DIAGNOSIS — Z91030 Bee allergy status: Secondary | ICD-10-CM | POA: Insufficient documentation

## 2017-01-02 DIAGNOSIS — M1990 Unspecified osteoarthritis, unspecified site: Secondary | ICD-10-CM | POA: Diagnosis not present

## 2017-01-02 HISTORY — PX: KNEE ARTHROSCOPY WITH MEDIAL MENISECTOMY: SHX5651

## 2017-01-02 SURGERY — ARTHROSCOPY, KNEE, WITH MEDIAL MENISCECTOMY
Anesthesia: Spinal | Site: Knee | Laterality: Left

## 2017-01-02 MED ORDER — KETAMINE HCL 10 MG/ML IJ SOLN
INTRAMUSCULAR | Status: DC | PRN
  Administered 2017-01-02: 20 mg via INTRAVENOUS
  Administered 2017-01-02: 10 mg via INTRAVENOUS

## 2017-01-02 MED ORDER — LACTATED RINGERS IV SOLN
INTRAVENOUS | Status: DC
  Administered 2017-01-02: 10:00:00 via INTRAVENOUS

## 2017-01-02 MED ORDER — LIDOCAINE 2% (20 MG/ML) 5 ML SYRINGE
INTRAMUSCULAR | Status: DC | PRN
  Administered 2017-01-02: 100 mg via INTRAVENOUS

## 2017-01-02 MED ORDER — BUPIVACAINE HCL (PF) 0.5 % IJ SOLN
INTRAMUSCULAR | Status: AC
  Filled 2017-01-02: qty 30

## 2017-01-02 MED ORDER — ONDANSETRON HCL 4 MG/2ML IJ SOLN
INTRAMUSCULAR | Status: AC
  Filled 2017-01-02: qty 2

## 2017-01-02 MED ORDER — CHLORHEXIDINE GLUCONATE 4 % EX LIQD: 60.0000 mL | Freq: Once | CUTANEOUS | Status: DC

## 2017-01-02 MED ORDER — LIDOCAINE HCL 1 % IJ SOLN
INTRAMUSCULAR | Status: AC
  Filled 2017-01-02: qty 20

## 2017-01-02 MED ORDER — ONDANSETRON HCL 4 MG/2ML IJ SOLN
INTRAMUSCULAR | Status: DC | PRN
  Administered 2017-01-02: 4 mg via INTRAVENOUS

## 2017-01-02 MED ORDER — FENTANYL CITRATE (PF) 100 MCG/2ML IJ SOLN: 25.0000 ug | INTRAMUSCULAR | Status: DC | PRN

## 2017-01-02 MED ORDER — KETAMINE HCL 10 MG/ML IJ SOLN
INTRAMUSCULAR | Status: AC
  Filled 2017-01-02: qty 1

## 2017-01-02 MED ORDER — MIDAZOLAM HCL 2 MG/2ML IJ SOLN
INTRAMUSCULAR | Status: AC
  Filled 2017-01-02: qty 2

## 2017-01-02 MED ORDER — MORPHINE SULFATE (PF) 4 MG/ML IV SOLN
INTRAVENOUS | Status: AC
  Filled 2017-01-02: qty 1

## 2017-01-02 MED ORDER — LIDOCAINE HCL 1 % IJ SOLN
INTRAMUSCULAR | Status: DC | PRN
  Administered 2017-01-02: 20 mL

## 2017-01-02 MED ORDER — FENTANYL CITRATE (PF) 100 MCG/2ML IJ SOLN
INTRAMUSCULAR | Status: AC
  Filled 2017-01-02: qty 2

## 2017-01-02 MED ORDER — MORPHINE SULFATE (PF) 4 MG/ML IV SOLN
INTRAVENOUS | Status: DC | PRN
  Administered 2017-01-02: 4 mg

## 2017-01-02 MED ORDER — PROPOFOL 500 MG/50ML IV EMUL
INTRAVENOUS | Status: DC | PRN
Start: 2017-01-02 — End: 2017-01-02
  Administered 2017-01-02: 40 ug/kg/min via INTRAVENOUS

## 2017-01-02 MED ORDER — PROPOFOL 10 MG/ML IV BOLUS
INTRAVENOUS | Status: AC
  Filled 2017-01-02: qty 40

## 2017-01-02 MED ORDER — FENTANYL CITRATE (PF) 100 MCG/2ML IJ SOLN
INTRAMUSCULAR | Status: DC | PRN
  Administered 2017-01-02 (×2): 25 ug via INTRAVENOUS

## 2017-01-02 MED ORDER — SODIUM CHLORIDE 0.9 % IR SOLN
Status: DC | PRN
  Administered 2017-01-02: 6000 mL

## 2017-01-02 MED ORDER — OXYCODONE-ACETAMINOPHEN 10-325 MG PO TABS: 1.0000 | ORAL_TABLET | ORAL | 0 refills | Status: DC | PRN

## 2017-01-02 MED ORDER — MIDAZOLAM HCL 5 MG/5ML IJ SOLN
INTRAMUSCULAR | Status: DC | PRN
  Administered 2017-01-02: 2 mg via INTRAVENOUS

## 2017-01-02 MED ORDER — BUPIVACAINE HCL (PF) 0.5 % IJ SOLN
INTRAMUSCULAR | Status: DC | PRN
  Administered 2017-01-02: 30 mL via INTRA_ARTICULAR

## 2017-01-02 MED ORDER — PROMETHAZINE HCL 25 MG/ML IJ SOLN: 6.2500 mg | INTRAMUSCULAR | Status: DC | PRN

## 2017-01-02 SURGICAL SUPPLY — 32 items
BANDAGE ACE 6X5 VEL STRL LF (GAUZE/BANDAGES/DRESSINGS) ×2 IMPLANT
BLADE CUDA SHAVER 3.5 (BLADE) ×2 IMPLANT
BNDG GAUZE ELAST 4 BULKY (GAUZE/BANDAGES/DRESSINGS) ×1 IMPLANT
COVER SURGICAL LIGHT HANDLE (MISCELLANEOUS) ×2 IMPLANT
CUFF TOURN SGL QUICK 34 (TOURNIQUET CUFF)
CUFF TRNQT CYL 34X4X40X1 (TOURNIQUET CUFF) IMPLANT
DRAPE U-SHAPE 47X51 STRL (DRAPES) ×2 IMPLANT
DRSG ADAPTIC 3X8 NADH LF (GAUZE/BANDAGES/DRESSINGS) ×1 IMPLANT
DRSG PAD ABDOMINAL 8X10 ST (GAUZE/BANDAGES/DRESSINGS) ×2 IMPLANT
DURAPREP 26ML APPLICATOR (WOUND CARE) ×2 IMPLANT
GAUZE SPONGE 4X4 12PLY STRL (GAUZE/BANDAGES/DRESSINGS) ×1 IMPLANT
GAUZE XEROFORM 1X8 LF (GAUZE/BANDAGES/DRESSINGS) ×2 IMPLANT
GLOVE BIO SURGEON STRL SZ7.5 (GLOVE) ×2 IMPLANT
GLOVE BIOGEL PI IND STRL 8 (GLOVE) ×2 IMPLANT
GLOVE BIOGEL PI INDICATOR 8 (GLOVE) ×2
GLOVE ECLIPSE 8.0 STRL XLNG CF (GLOVE) ×2 IMPLANT
GOWN STRL REUS W/TWL XL LVL3 (GOWN DISPOSABLE) ×4 IMPLANT
IV LACTATED RINGER IRRG 3000ML (IV SOLUTION) ×4
IV LR IRRIG 3000ML ARTHROMATIC (IV SOLUTION) ×2 IMPLANT
KIT BASIN OR (CUSTOM PROCEDURE TRAY) IMPLANT
MANIFOLD NEPTUNE II (INSTRUMENTS) ×2 IMPLANT
PACK ARTHROSCOPY WL (CUSTOM PROCEDURE TRAY) ×2 IMPLANT
PAD ABD 8X10 STRL (GAUZE/BANDAGES/DRESSINGS) ×1 IMPLANT
PADDING CAST COTTON 6X4 STRL (CAST SUPPLIES) ×2 IMPLANT
POSITIONER SURGICAL ARM (MISCELLANEOUS) ×2 IMPLANT
STRIP CLOSURE SKIN 1/2X4 (GAUZE/BANDAGES/DRESSINGS) ×1 IMPLANT
SUT ETHILON 4 0 PS 2 18 (SUTURE) ×2 IMPLANT
SYR CONTROL 10ML LL (SYRINGE) ×2 IMPLANT
TOWEL OR 17X26 10 PK STRL BLUE (TOWEL DISPOSABLE) ×2 IMPLANT
TUBING ARTHRO INFLOW-ONLY STRL (TUBING) ×1 IMPLANT
WAND HAND CNTRL MULTIVAC 90 (MISCELLANEOUS) IMPLANT
WRAP KNEE MAXI GEL POST OP (GAUZE/BANDAGES/DRESSINGS) ×2 IMPLANT

## 2017-01-02 NOTE — Anesthesia Preprocedure Evaluation (Addendum)
Anesthesia Evaluation  Patient identified by MRN, date of birth, ID band Patient awake    Reviewed: Allergy & Precautions, NPO status , Patient's Chart, lab work & pertinent test results  History of Anesthesia Complications (+) PROLONGED EMERGENCE, Family history of anesthesia reaction and history of anesthetic complications  Airway Mallampati: II  TM Distance: >3 FB Neck ROM: Limited    Dental no notable dental hx. (+) Dental Advisory Given   Pulmonary asthma , sleep apnea and Continuous Positive Airway Pressure Ventilation , pneumonia, resolved, COPD, former smoker,    Pulmonary exam normal breath sounds clear to auscultation       Cardiovascular hypertension, Normal cardiovascular exam Rhythm:Regular Rate:Normal  Cardiomegaly   Neuro/Psych  Headaches, PSYCHIATRIC DISORDERS Anxiety Depression    GI/Hepatic Neg liver ROS, GERD  Controlled and Medicated,IBS   Endo/Other  Super morbid obesity (BMI 51)  Renal/GU negative Renal ROS  negative genitourinary   Musculoskeletal  (+) Arthritis , Osteoarthritis,    Abdominal (+) + obese,   Peds  Hematology negative hematology ROS (+)   Anesthesia Other Findings   Reproductive/Obstetrics                           Anesthesia Physical Anesthesia Plan  ASA: III  Anesthesia Plan: Spinal   Post-op Pain Management:    Induction: Intravenous  PONV Risk Score and Plan: 1 and Ondansetron and Treatment may vary due to age or medical condition  Airway Management Planned: Simple Face Mask  Additional Equipment:   Intra-op Plan:   Post-operative Plan:   Informed Consent: I have reviewed the patients History and Physical, chart, labs and discussed the procedure including the risks, benefits and alternatives for the proposed anesthesia with the patient or authorized representative who has indicated his/her understanding and acceptance.   Dental  advisory given  Plan Discussed with: CRNA and Surgeon  Anesthesia Plan Comments: (Patient with hx of prolonged emergence and difficulty with weaning from vent with prior anesthetics. Requests sedation and regional as preference to GA. Discussed with surgeon, will attempt spinal anesthetic as primary plan.)      Anesthesia Quick Evaluation

## 2017-01-02 NOTE — Discharge Instructions (Signed)
Expect left knee swelling - ice and elevation as needed. Pump your feet several times throughout the day for circulation. Increase your activities as comfort allows. You can put full weight on your left leg. Leave your dressings on for the next 24 hours. Tomorrow evening you may remove your dressings and then put daily band-aids over your incisions after each shower.

## 2017-01-02 NOTE — Transfer of Care (Signed)
Immediate Anesthesia Transfer of Care Note  Patient: Mark Harrell  Procedure(s) Performed: Procedure(s): LEFT KNEE ARTHROSCOPY WITH PARTIAL MEDIAL MENISCECTOMY (Left)  Patient Location: PACU  Anesthesia Type:MAC  Level of Consciousness: awake, alert , oriented and patient cooperative  Airway & Oxygen Therapy: Patient Spontanous Breathing and Patient connected to face mask oxygen  Post-op Assessment: Report given to RN, Post -op Vital signs reviewed and stable and Patient moving all extremities  Post vital signs: Reviewed and stable  Last Vitals:  Vitals:   01/02/17 0929  BP: 128/75  Pulse: 79  Resp: 20  Temp: 37.4 C  SpO2: 95%    Last Pain:  Vitals:   01/02/17 1001  TempSrc:   PainSc: 7          Complications: No apparent anesthesia complications

## 2017-01-02 NOTE — H&P (Signed)
Mark Harrell is an 56 y.o. male.   Chief Complaint:   Left knee pain with locking and catching HPI:   56 yo morbidly obese male with a symptomatic left knee medial meniscal tear.  Has failed conservative treatment at this point and wishes to proceed with a knee arthroscopy.  He full understands the risks of this given his co-morbidities.  Past Medical History:  Diagnosis Date  . Anesthesia complication requiring reversal agent administration    ? from central apnea, very difficult to get off vent  . Anxiety   . Arthritis    osteo  . Asthma   . BPH (benign prostatic hyperplasia)   . Complication of anesthesia    difficulty waking , they twlight me because of my respiratory problems "  . Dyspnea   . Enlarged heart   . Family history of adverse reaction to anesthesia    mother trouble waking up, and heart stopped  . GERD (gastroesophageal reflux disease)   . Headache    botox injections for headaches  . Hyperlipidemia   . Hypertension   . Hypogonadism male   . IBS (irritable bowel syndrome)   . Obesity   . OSA (obstructive sleep apnea)    cpap  . Pneumonia   . Pre-diabetes   . Prostatitis     Past Surgical History:  Procedure Laterality Date  . ABDOMINAL SURGERY    . ANKLE FRACTURE SURGERY Right   . CYSTOSCOPY     [place]  . TONSILLECTOMY    . [place] RESECTION  2007  . UVULOPALATOPHARYNGOPLASTY      Family History  Problem Relation Age of Onset  . Diabetes Paternal Uncle   . Cancer Father        lymphoma, colon  . Diabetes Maternal Grandmother   . Heart disease Maternal Grandfather   . Diabetes Maternal Grandfather   . Diabetes Paternal Grandmother   . Diabetes Paternal Grandfather   . Dementia Mother   . Prostate cancer Maternal Uncle   . Lung disease Neg Hx   . Rheumatologic disease Neg Hx    Social History:  reports that he quit smoking about 21 years ago. He started smoking about 36 years ago. He has a 1.50 pack-year smoking history. He has never used  smokeless tobacco. He reports that he drinks about 0.6 oz of alcohol per week . He reports that he does not use drugs.  Allergies:  Allergies  Allergen Reactions  . Bee Venom Swelling  . Ppd [Tuberculin Purified Protein Derivative] Other (See Comments)    +ppd NEG Quantferron Gold 3/13  . Fenofibrate Other (See Comments)    Back pain  . Levofloxacin Diarrhea  . Other Other (See Comments)    Some antibiotics cause diarrhea  . Verapamil Other (See Comments)    Back pain  . Claritin [Loratadine] Other (See Comments)  . Cymbalta [Duloxetine Hcl] Other (See Comments)    unknown    Facility-Administered Medications Prior to Admission  Medication Dose Route Frequency Provider Last Rate Last Dose  . Mepolizumab SOLR 100 mg  100 mg Subcutaneous Q28 days [name], MD   100 mg at 12/18/16 1439   Medications Prior to Admission  Medication Sig Dispense Refill  . albuterol (PROAIR HFA) 108 (90 Base) MCG/ACT inhaler INHALE 2 PUFFS INTO THE LUNGS EVERY 4 (FOUR) HOURS AS NEEDED FOR WHEEZING OR SHORTNESS OF BREATH. 8.5 each 3  . albuterol (PROVENTIL) (2.5 MG/3ML) 0.083% nebulizer solution Inhale 2.5 mg into the  lungs every 4 (four) hours as needed.  12  . alfuzosin (UROXATRAL) 10 MG 24 hr tablet Take 10 mg by mouth daily.      . baclofen (LIORESAL) 10 MG tablet Take 10 mg by mouth 3 (three) times daily as needed for muscle spasms.     . bisoprolol-hydrochlorothiazide (ZIAC) 5-6.25 MG tablet Take 1 tablet by mouth daily. 90 tablet 0  . budesonide-formoterol (SYMBICORT) 160-4.5 MCG/ACT inhaler Inhale 2 puffs into the lungs 2 (two) times daily. 1 Inhaler 5  . Calcium Carbonate-Vitamin D (CALCIUM + D PO) Take 500 mg by mouth 2 (two) times daily.     . cetaphil (CETAPHIL) lotion Apply topically 2 (two) times daily. 236 mL 0  . cetirizine (ZYRTEC) 10 MG tablet Take 10 mg by mouth daily.    . Cholecalciferol (VITAMIN D PO) Take 10,000-20,000 Units by mouth 2 (two) times daily. 20000 in the  morning and 10000 in the evening    . Cinnamon 500 MG capsule Take 500 mg by mouth 2 (two) times daily.     . cyclobenzaprine (FLEXERIL) 10 MG tablet Take 10 mg by mouth 3 (three) times daily as needed for muscle spasms.    [name] donepezil (ARICEPT) 23 MG TABS tablet Take 23 mg by mouth at bedtime.    . DYMISTA 137-50 MCG/ACT SUSP PLACE 2 SPRAYS INTO BOTH NOSTRILS 2 (TWO) TIMES DAILY.  5  . Eluxadoline (VIBERZI) 100 MG TABS Take 1 tablet by mouth 2 (two) times daily.     [name] esomeprazole (NEXIUM) 20 MG capsule Take 20 mg by mouth daily.  1  . gabapentin (NEURONTIN) 600 MG tablet 1,800 mg 3 (three) times daily.     [name] guaiFENesin (MUCINEX) 600 MG 12 hr tablet Take 600 mg by mouth 2 (two) times daily as needed for cough or to loosen phlegm.     . LUTEIN PO Take 1 tablet by mouth 2 (two) times daily.    . memantine (NAMENDA) 10 MG tablet Take 10 mg by mouth 2 (two) times daily.     . Mepolizumab (NUCALA) 100 MG SOLR Pt receives injection once a month    . Methylfol-Algae-B12-Acetylcyst (CEREFOLIN NAC) 6-90.314-2-600 MG TABS Take 1 tablet by mouth daily.     . Misc Natural Products (TURMERIC CURCUMIN) CAPS Take 1 capsule by mouth 2 (two) times daily.    . montelukast (SINGULAIR) 10 MG tablet TAKE 1 TABLET (10 MG TOTAL) BY MOUTH DAILY. 90 tablet 1  . Multiple Vitamins-Minerals (MULTIVITAMIN WITH MINERALS) tablet Take 1 tablet by mouth daily.    . Omega-3 Krill Oil 1000 MG CAPS Take 1,000 mg by mouth 2 (two) times daily.    . ondansetron (ZOFRAN ODT) 4 MG disintegrating tablet Take 1 tablet (4 mg total) by mouth every 6 (six) hours as needed for nausea or vomiting. 30 tablet 0  . oxyCODONE-acetaminophen (PERCOCET) 10-325 MG tablet Take 1 tablet by mouth every 4 (four) hours as needed for pain.    [name] oxymetazoline (AFRIN) 0.05 % nasal spray Place 1 spray into both nostrils See admin instructions. 1 SPRAY PER SIDE EACH NIGHT BEFORE DYMISTA    . pentosan polysulfate (ELMIRON) 100 MG capsule Take 200 mg by mouth 3  (three) times daily before meals.     . Phosphatidylserine-DHA-EPA (VAYACOG PO) Take 1 capsule by mouth 2 (two) times daily.    . Potassium 99 MG TABS Take 1 tablet by mouth daily.    . pravastatin (PRAVACHOL) 40 MG tablet TAKE 1  TABLET BY MOUTH AT BEDTIME 90 tablet 2  . PRESCRIPTION MEDICATION Pt receives weekly allergy shots    . sertraline (ZOLOFT) 50 MG tablet Take 50 mg by mouth 2 (two) times daily.     . sodium chloride (OCEAN) 0.65 % SOLN nasal spray Place 1 spray into both nostrils 4 (four) times daily as needed for congestion. Uses each time before other nasal sprays    . tadalafil (CIALIS) 5 MG tablet Take 5 mg by mouth every evening.     . triamcinolone cream (KENALOG) 0.1 % APPLY TOPICALLY 4 TIMES A DAY (Patient taking differently: APPLY TOPICALLY 2 TIMES A DAY AS NEEDED) 30 g 1  . ZENPEP 25000-79000 units CPEP Take 2 capsules by mouth 3 (three) times daily.  0  . desmopressin (DDAVP) 0.2 MG tablet Take 200 mcg by mouth at bedtime.  3  . trospium (SANCTURA) 20 MG tablet Take 20 mg by mouth 2 (two) times daily.   2    No results found for this or any previous visit (from the past 48 hour(s)). No results found.  Review of Systems  Respiratory: Positive for shortness of breath.   Musculoskeletal: Positive for joint pain.  All other systems reviewed and are negative.   Blood pressure 128/75, pulse 79, temperature 99.3 F (37.4 C), temperature source Oral, resp. rate 20, SpO2 95 %. Physical Exam  Constitutional: He is oriented to person, place, and time. He appears well-developed and well-nourished.  HENT:  Head: Normocephalic and atraumatic.  Eyes: Pupils are equal, round, and reactive to light.  Neck: Normal range of motion.  Cardiovascular: Normal rate.   Respiratory: Effort normal. He has wheezes.  GI: Soft.  Musculoskeletal:       Left knee: He exhibits effusion. Tenderness found. Medial joint line tenderness noted.  Neurological: He is alert and oriented to person,  place, and time.  Skin: Skin is warm and dry.  Psychiatric: He has a normal mood and affect.     Assessment/Plan Left knee with symptomatic medial meniscal tear 1)  To the OR today for a left knee arthroscopy.  Will keep him overnight given his co-morbidities.  Risks and benefits have been explained in detail.  Mcarthur Rossetti, MD 01/02/2017, 9:35 AM

## 2017-01-02 NOTE — Brief Op Note (Signed)
01/02/2017  12:24 PM  PATIENT:  Mark Harrell  56 y.o. male  PRE-OPERATIVE DIAGNOSIS:  left knee medial meniscal tear  POST-OPERATIVE DIAGNOSIS:  left knee medial meniscal tear  PROCEDURE:  Procedure(s): LEFT KNEE ARTHROSCOPY WITH PARTIAL MEDIAL MENISCECTOMY (Left)  SURGEON:  Surgeon(s) and Role:    Mcarthur Rossetti, MD - Primary  PHYSICIAN ASSISTANT: [name], PA-C  ANESTHESIA:   local and IV sedation  COUNTS:  YES  TOURNIQUET:  * No tourniquets in log *  DICTATION: .Other Dictation: Dictation Number [id number]  PLAN OF CARE: discharge to home  PATIENT DISPOSITION:  PACU - hemodynamically stable.   Delay start of Pharmacological VTE agent (>24hrs) due to surgical blood loss or risk of bleeding: no

## 2017-01-02 NOTE — Anesthesia Postprocedure Evaluation (Addendum)
Anesthesia Post Note  Patient: Mark Harrell  Procedure(s) Performed: Procedure(s) (LRB): LEFT KNEE ARTHROSCOPY WITH PARTIAL MEDIAL MENISCECTOMY (Left)     Patient location during evaluation: PACU Anesthesia Type: MAC Level of consciousness: awake and alert Pain management: pain level controlled Vital Signs Assessment: post-procedure vital signs reviewed and stable Respiratory status: spontaneous breathing, nonlabored ventilation, respiratory function stable and patient connected to nasal cannula oxygen Cardiovascular status: stable and blood pressure returned to baseline Postop Assessment: no backache and no headache Anesthetic complications: no    Last Vitals:  Vitals:   01/02/17 1300 01/02/17 1315  BP: 113/65 116/63  Pulse: 64 66  Resp: 14 16  Temp:  36.8 C  SpO2: 96% (!) 89%    Last Pain:  Vitals:   01/02/17 1315  TempSrc:   PainSc: 3                  Audry Pili

## 2017-01-02 NOTE — Anesthesia Procedure Notes (Signed)
Procedure Name: MAC Date/Time: 01/02/2017 11:20 AM Performed by: Carleene Cooper A Pre-anesthesia Checklist: Patient identified, Emergency Drugs available, Suction available, Patient being monitored and Timeout performed Patient Re-evaluated:Patient Re-evaluated prior to induction Oxygen Delivery Method: Simple face mask Dental Injury: Teeth and Oropharynx as per pre-operative assessment

## 2017-01-02 NOTE — Anesthesia Procedure Notes (Signed)
Spinal  Patient location during procedure: OR Start time: 01/02/2017 11:40 AM End time: 01/02/2017 11:55 AM Reason for block: procedure for pain Staffing Anesthesiologist: Renold Don E Performed: anesthesiologist  Preanesthetic Checklist Completed: patient identified, surgical consent, pre-op evaluation, timeout performed, IV checked, risks and benefits discussed and monitors and equipment checked Spinal Block Patient position: sitting Prep: DuraPrep Patient monitoring: heart rate, cardiac monitor, continuous pulse ox and blood pressure Approach: midline Location: L3-4 Injection technique: single-shot Needle Needle type: Pencan  Needle gauge: 24 G Assessment Events: failed spinal Additional Notes Functioning IV was confirmed and monitors were applied. Sterile prep and drape, including hand hygiene, mask, and sterile gloves were used. The patient was positioned and the spine was prepped. The skin was anesthetized with lidocaine. Free flow of clear CSF was obtained prior to injecting local anesthetic into the CSF. The spinal needle aspirated freely following injection. The needle was carefully withdrawn. The patient tolerated the procedure well. Consent was obtained prior to the procedure with all questions answered and concerns addressed. Risks including, but not limited to, bleeding, infection, nerve damage, paralysis, failed block, inadequate analgesia, allergic reaction, high spinal, itching, and headache were discussed and the patient wished to proceed.  Unable to reach/locate spinal space. No meds given as CSF never obtained. Discussed with surgeon, agreed to infiltrate knee with local anesthetic and attempt procedure under MAC. Glidescope in room if needing to convert to GA.  Renold Don, MD

## 2017-01-03 ENCOUNTER — Encounter (HOSPITAL_COMMUNITY): Payer: Self-pay

## 2017-01-03 ENCOUNTER — Emergency Department (HOSPITAL_COMMUNITY): Payer: BLUE CROSS/BLUE SHIELD

## 2017-01-03 ENCOUNTER — Observation Stay (HOSPITAL_COMMUNITY)
Admission: EM | Admit: 2017-01-03 | Discharge: 2017-01-05 | Disposition: A | Discharge status: Home / Self Care | Payer: BLUE CROSS/BLUE SHIELD | Attending: Family Medicine | Admitting: Family Medicine

## 2017-01-03 DIAGNOSIS — F418 Other specified anxiety disorders: Secondary | ICD-10-CM | POA: Diagnosis present

## 2017-01-03 DIAGNOSIS — R51 Headache: Secondary | ICD-10-CM | POA: Diagnosis not present

## 2017-01-03 DIAGNOSIS — M1990 Unspecified osteoarthritis, unspecified site: Secondary | ICD-10-CM | POA: Insufficient documentation

## 2017-01-03 DIAGNOSIS — R079 Chest pain, unspecified: Secondary | ICD-10-CM | POA: Diagnosis not present

## 2017-01-03 DIAGNOSIS — J449 Chronic obstructive pulmonary disease, unspecified: Secondary | ICD-10-CM | POA: Insufficient documentation

## 2017-01-03 DIAGNOSIS — E782 Mixed hyperlipidemia: Secondary | ICD-10-CM | POA: Diagnosis present

## 2017-01-03 DIAGNOSIS — Z7951 Long term (current) use of inhaled steroids: Secondary | ICD-10-CM | POA: Diagnosis not present

## 2017-01-03 DIAGNOSIS — J9601 Acute respiratory failure with hypoxia: Secondary | ICD-10-CM | POA: Diagnosis not present

## 2017-01-03 DIAGNOSIS — Z8042 Family history of malignant neoplasm of prostate: Secondary | ICD-10-CM | POA: Diagnosis not present

## 2017-01-03 DIAGNOSIS — R509 Fever, unspecified: Secondary | ICD-10-CM | POA: Diagnosis not present

## 2017-01-03 DIAGNOSIS — Z6841 Body Mass Index (BMI) 40.0 and over, adult: Secondary | ICD-10-CM | POA: Insufficient documentation

## 2017-01-03 DIAGNOSIS — F419 Anxiety disorder, unspecified: Secondary | ICD-10-CM | POA: Diagnosis not present

## 2017-01-03 DIAGNOSIS — Z79899 Other long term (current) drug therapy: Secondary | ICD-10-CM | POA: Insufficient documentation

## 2017-01-03 DIAGNOSIS — R112 Nausea with vomiting, unspecified: Secondary | ICD-10-CM | POA: Diagnosis not present

## 2017-01-03 DIAGNOSIS — F329 Major depressive disorder, single episode, unspecified: Secondary | ICD-10-CM | POA: Diagnosis not present

## 2017-01-03 DIAGNOSIS — N401 Enlarged prostate with lower urinary tract symptoms: Secondary | ICD-10-CM | POA: Insufficient documentation

## 2017-01-03 DIAGNOSIS — Z91030 Bee allergy status: Secondary | ICD-10-CM | POA: Insufficient documentation

## 2017-01-03 DIAGNOSIS — J40 Bronchitis, not specified as acute or chronic: Secondary | ICD-10-CM | POA: Diagnosis present

## 2017-01-03 DIAGNOSIS — R5082 Postprocedural fever: Secondary | ICD-10-CM

## 2017-01-03 DIAGNOSIS — Z87891 Personal history of nicotine dependence: Secondary | ICD-10-CM | POA: Diagnosis not present

## 2017-01-03 DIAGNOSIS — Z833 Family history of diabetes mellitus: Secondary | ICD-10-CM | POA: Insufficient documentation

## 2017-01-03 DIAGNOSIS — F32A Depression, unspecified: Secondary | ICD-10-CM | POA: Diagnosis present

## 2017-01-03 DIAGNOSIS — G4733 Obstructive sleep apnea (adult) (pediatric): Secondary | ICD-10-CM | POA: Insufficient documentation

## 2017-01-03 DIAGNOSIS — K219 Gastro-esophageal reflux disease without esophagitis: Secondary | ICD-10-CM | POA: Diagnosis present

## 2017-01-03 DIAGNOSIS — R05 Cough: Secondary | ICD-10-CM | POA: Insufficient documentation

## 2017-01-03 DIAGNOSIS — I10 Essential (primary) hypertension: Secondary | ICD-10-CM | POA: Diagnosis present

## 2017-01-03 DIAGNOSIS — Z881 Allergy status to other antibiotic agents status: Secondary | ICD-10-CM | POA: Insufficient documentation

## 2017-01-03 DIAGNOSIS — N400 Enlarged prostate without lower urinary tract symptoms: Secondary | ICD-10-CM | POA: Diagnosis present

## 2017-01-03 DIAGNOSIS — M791 Myalgia: Secondary | ICD-10-CM | POA: Insufficient documentation

## 2017-01-03 DIAGNOSIS — E785 Hyperlipidemia, unspecified: Secondary | ICD-10-CM | POA: Diagnosis not present

## 2017-01-03 DIAGNOSIS — Z888 Allergy status to other drugs, medicaments and biological substances status: Secondary | ICD-10-CM | POA: Insufficient documentation

## 2017-01-03 DIAGNOSIS — Z807 Family history of other malignant neoplasms of lymphoid, hematopoietic and related tissues: Secondary | ICD-10-CM | POA: Insufficient documentation

## 2017-01-03 DIAGNOSIS — R0602 Shortness of breath: Secondary | ICD-10-CM | POA: Insufficient documentation

## 2017-01-03 DIAGNOSIS — A419 Sepsis, unspecified organism: Secondary | ICD-10-CM | POA: Diagnosis present

## 2017-01-03 DIAGNOSIS — J189 Pneumonia, unspecified organism: Secondary | ICD-10-CM

## 2017-01-03 DIAGNOSIS — E559 Vitamin D deficiency, unspecified: Secondary | ICD-10-CM | POA: Insufficient documentation

## 2017-01-03 DIAGNOSIS — E119 Type 2 diabetes mellitus without complications: Secondary | ICD-10-CM | POA: Diagnosis not present

## 2017-01-03 LAB — CBC WITH DIFFERENTIAL/PLATELET
Basophils Absolute: 0 10*3/uL (ref 0.0–0.1)
Basophils Relative: 0 %
Eosinophils Absolute: 0.1 10*3/uL (ref 0.0–0.7)
Eosinophils Relative: 1 %
HEMATOCRIT: 42.9 % (ref 39.0–52.0)
HEMOGLOBIN: 13.6 g/dL (ref 13.0–17.0)
LYMPHS PCT: 28 %
Lymphs Abs: 2.6 10*3/uL (ref 0.7–4.0)
MCH: 28.9 pg (ref 26.0–34.0)
MCHC: 31.7 g/dL (ref 30.0–36.0)
MCV: 91.1 fL (ref 78.0–100.0)
MONO ABS: 1 10*3/uL (ref 0.1–1.0)
MONOS PCT: 11 %
NEUTROS ABS: 5.6 10*3/uL (ref 1.7–7.7)
NEUTROS PCT: 60 %
Platelets: 269 10*3/uL (ref 150–400)
RBC: 4.71 MIL/uL (ref 4.22–5.81)
RDW: 13.8 % (ref 11.5–15.5)
WBC: 9.3 10*3/uL (ref 4.0–10.5)

## 2017-01-03 LAB — PROTIME-INR
INR: 1.03
Prothrombin Time: 13.5 seconds (ref 11.4–15.2)

## 2017-01-03 LAB — COMPREHENSIVE METABOLIC PANEL
ALBUMIN: 3.4 g/dL — AB (ref 3.5–5.0)
ALK PHOS: 60 U/L (ref 38–126)
ALT: 23 U/L (ref 17–63)
ANION GAP: 7 (ref 5–15)
AST: 21 U/L (ref 15–41)
BILIRUBIN TOTAL: 1.2 mg/dL (ref 0.3–1.2)
BUN: 17 mg/dL (ref 6–20)
CO2: 33 mmol/L — ABNORMAL HIGH (ref 22–32)
CREATININE: 0.95 mg/dL (ref 0.61–1.24)
Calcium: 9 mg/dL (ref 8.9–10.3)
Chloride: 100 mmol/L — ABNORMAL LOW (ref 101–111)
GFR calc Af Amer: >60 mL/min (ref >60)
GFR calc non Af Amer: >60 mL/min (ref >60)
GLUCOSE: 92 mg/dL (ref 65–99)
Potassium: 4 mmol/L (ref 3.5–5.1)
Sodium: 140 mmol/L (ref 135–145)
TOTAL PROTEIN: 6.3 g/dL — AB (ref 6.5–8.1)

## 2017-01-03 LAB — I-STAT CG4 LACTIC ACID, ED: Lactic Acid, Venous: 1.51 mmol/L (ref 0.5–1.9)

## 2017-01-03 MED ORDER — IOPAMIDOL (ISOVUE-370) INJECTION 76%
100.0000 mL | Freq: Once | INTRAVENOUS | Status: AC | PRN
  Administered 2017-01-03: 100 mL via INTRAVENOUS

## 2017-01-03 MED ORDER — SODIUM CHLORIDE 0.9 % IV BOLUS (SEPSIS)
1000.0000 mL | Freq: Once | INTRAVENOUS | Status: AC
  Administered 2017-01-03: 1000 mL via INTRAVENOUS

## 2017-01-03 MED ORDER — SODIUM CHLORIDE 0.9 % IJ SOLN
INTRAMUSCULAR | Status: AC
  Filled 2017-01-03: qty 50

## 2017-01-03 MED ORDER — ACETAMINOPHEN 325 MG PO TABS
650.0000 mg | ORAL_TABLET | Freq: Once | ORAL | Status: AC
  Administered 2017-01-03: 650 mg via ORAL
  Filled 2017-01-03: qty 2

## 2017-01-03 MED ORDER — IOPAMIDOL (ISOVUE-370) INJECTION 76%
INTRAVENOUS | Status: AC
  Filled 2017-01-03: qty 100

## 2017-01-03 NOTE — ED Provider Notes (Signed)
[facility] DEPT Provider Note   CSN: [id number] Arrival date & time: 01/03/17  2147     History   Chief Complaint Chief Complaint  Patient presents with  . Headache  . Neck Pain  . Chest Pain    HPI Mark Harrell is a 56 y.o. male status post left knee meniscus repair on 01/02/2017, satting with myalgias, HA, and shortness of breath that began this morning. Patient states overnight he vomited in his sleep and woke up choking and coughing. Since this time he has been coughing up phlegm with associated shortness of breath and chest pain. Procedure was done under conscious sedation with spinal block, per procedure note there were no complications of anesthesia. He reports associated nausea, and neck pain. Denies urinary symptoms, abdominal pain, calf pain, or worsening pain in left knee. Reports he has been taking oxycodone as prescribed for pain.  The history is provided by the patient.    Past Medical History:  Diagnosis Date  . Anesthesia complication requiring reversal agent administration    ? from central apnea, very difficult to get off vent  . Anxiety   . Arthritis    osteo  . Asthma   . BPH (benign prostatic hyperplasia)   . Complication of anesthesia    difficulty waking , they twlight me because of my respiratory problems "  . Dyspnea   . Enlarged heart   . Family history of adverse reaction to anesthesia    mother trouble waking up, and heart stopped  . GERD (gastroesophageal reflux disease)   . Headache    botox injections for headaches  . Hyperlipidemia   . Hypertension   . Hypogonadism male   . IBS (irritable bowel syndrome)   . Obesity   . OSA (obstructive sleep apnea)    cpap  . Pneumonia   . Pre-diabetes   . Prostatitis     Patient Active Problem List   Diagnosis Date Noted  . Acute respiratory failure with hypoxia ([place]) 01/04/2017  . Depression 01/04/2017  . Headache 11/13/2016  . Migraine without aura and without status migrainosus, not  intractable 10/30/2016  . Irritant contact dermatitis due to frequent handwashing 06/08/2016  . Sepsis ([place]) 06/03/2016  . Nausea vomiting and diarrhea 06/03/2016  . Fever 06/03/2016  . COPD mixed type ([place]) 05/29/2016  . DM (diabetes mellitus) ([place]) 05/29/2016  . Lumbar radiculopathy 05/15/2016  . Memory change 05/15/2016  . Osteoarthritis of spine with radiculopathy, lumbar region 05/15/2016  . Risk for falls 05/15/2016  . Acute medial meniscal tear, left, subsequent encounter 04/23/2016  . Recurrent infections 03/29/2016  . Chronic nonseasonal allergic rhinitis due to pollen 03/29/2016  . Glucose intolerance (impaired glucose tolerance) 01/16/2016  . Polypharmacy 01/16/2016  . BMI 47.46,   adult ([place]) 03/06/2015  . SDAT 02/05/2015  . Asthma 02/05/2015  . OSA (obstructive sleep apnea) 02/05/2015  . Medication management 08/02/2014  . GERD (gastroesophageal reflux disease) 05/09/2014  . Obesity, Class III, BMI 40-49.9 (morbid obesity) ([place]) 02/01/2014  . Vitamin D deficiency 08/01/2013  . Prediabetes 08/01/2013  . 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 Diverticula of colon 05/07/2011  . Hypertension 01/31/2011  . Hyperlipidemia 01/31/2011  . Migraine 01/31/2011  . BPH (benign prostatic hyperplasia) 01/31/2011  . Testosterone Deficiency 01/31/2011  . IBS (irritable bowel syndrome) 01/31/2011  . Partial complex seizure disorder with intractable epilepsy ([place]) 01/31/2011  . Depression, controlled 01/31/2011  . COPD with asthma ([place]) 01/31/2011    Past Surgical History:  Procedure Laterality  Date  . ABDOMINAL SURGERY    . ANKLE FRACTURE SURGERY Right   . CYSTOSCOPY     [place]  . KNEE ARTHROSCOPY WITH MEDIAL MENISECTOMY Left 01/02/2017   Procedure: LEFT KNEE ARTHROSCOPY WITH PARTIAL MEDIAL MENISCECTOMY;  Surgeon: Mcarthur Rossetti, MD;  Location: [facility] ORS;  Service: Orthopedics;  Laterality: Left;  . TONSILLECTOMY    . [place] RESECTION  2007  . UVULOPALATOPHARYNGOPLASTY          Home Medications    Prior to Admission medications   Medication Sig Start Date End Date Taking? Authorizing Provider  albuterol (PROAIR HFA) 108 (90 Base) MCG/ACT inhaler INHALE 2 PUFFS INTO THE LUNGS EVERY 4 (FOUR) HOURS AS NEEDED FOR WHEEZING OR SHORTNESS OF BREATH. 08/13/15   [name], MD  albuterol (PROVENTIL) (2.5 MG/3ML) 0.083% nebulizer solution Inhale 2.5 mg into the lungs every 4 (four) hours as needed. 05/27/16   [provider]  alfuzosin (UROXATRAL) 10 MG 24 hr tablet Take 10 mg by mouth daily.      [provider]  baclofen (LIORESAL) 10 MG tablet Take 10 mg by mouth 3 (three) times daily as needed for muscle spasms.  03/03/16   [provider]  bisoprolol-hydrochlorothiazide (ZIAC) 5-6.25 MG tablet Take 1 tablet by mouth daily. 09/09/16   Vicie Mutters, PA-C  budesonide-formoterol [facility]) 160-4.5 MCG/ACT inhaler Inhale 2 puffs into the lungs 2 (two) times daily. 10/22/16   [name], MD  Calcium Carbonate-Vitamin D (CALCIUM + D PO) Take 500 mg by mouth 2 (two) times daily.     [provider]  cetaphil (CETAPHIL) lotion Apply topically 2 (two) times daily. 06/09/16   Lavina Hamman, MD  cetirizine (ZYRTEC) 10 MG tablet Take 10 mg by mouth daily.    [provider]  Cholecalciferol (VITAMIN D PO) Take 10,000-20,000 Units by mouth 2 (two) times daily. 20000 in the morning and 10000 in the evening    [provider]  Cinnamon 500 MG capsule Take 500 mg by mouth 2 (two) times daily.     [provider]  cyclobenzaprine (FLEXERIL) 10 MG tablet Take 10 mg by mouth 3 (three) times daily as needed for muscle spasms.    [provider]  desmopressin (DDAVP) 0.2 MG tablet Take 200 mcg by mouth at bedtime. 04/10/14   [provider]  donepezil (ARICEPT) 23 MG TABS tablet Take 23 mg by mouth at bedtime.    [provider]  DYMISTA 137-50 MCG/ACT SUSP PLACE 2 SPRAYS INTO BOTH  NOSTRILS 2 (TWO) TIMES DAILY. 04/10/16   [provider]  Eluxadoline (VIBERZI) 100 MG TABS Take 1 tablet by mouth 2 (two) times daily.     [provider]  esomeprazole (NEXIUM) 20 MG capsule Take 20 mg by mouth daily. 04/29/16   [provider]  gabapentin (NEURONTIN) 600 MG tablet 1,800 mg 3 (three) times daily.  07/28/13   [provider]  guaiFENesin (MUCINEX) 600 MG 12 hr tablet Take 600 mg by mouth 2 (two) times daily as needed for cough or to loosen phlegm.     [provider]  LUTEIN PO Take 1 tablet by mouth 2 (two) times daily.    [provider]  memantine (NAMENDA) 10 MG tablet Take 10 mg by mouth 2 (two) times daily.     [provider]  Mepolizumab (NUCALA) 100 MG SOLR Pt receives injection once a month    [provider]  Methylfol-Algae-B12-Acetylcyst (CEREFOLIN NAC) 6-90.314-2-600 MG  TABS Take 1 tablet by mouth daily.  08/03/12   [provider]  Misc Natural Products (TURMERIC CURCUMIN) CAPS Take 1 capsule by mouth 2 (two) times daily.    [provider]  montelukast (SINGULAIR) 10 MG tablet TAKE 1 TABLET (10 MG TOTAL) BY MOUTH DAILY. 08/28/16   [name], MD  Multiple Vitamins-Minerals (MULTIVITAMIN WITH MINERALS) tablet Take 1 tablet by mouth daily.    [provider]  Omega-3 Krill Oil 1000 MG CAPS Take 1,000 mg by mouth 2 (two) times daily.    [provider]  ondansetron (ZOFRAN ODT) 4 MG disintegrating tablet Take 1 tablet (4 mg total) by mouth every 6 (six) hours as needed for nausea or vomiting. 07/24/16   Vicie Mutters, PA-C  oxyCODONE-acetaminophen (PERCOCET) 10-325 MG tablet Take 1 tablet by mouth every 4 (four) hours as needed for pain. 01/02/17   Mcarthur Rossetti, MD  oxymetazoline (AFRIN) 0.05 % nasal spray Place 1 spray into both nostrils See admin instructions. 1 SPRAY PER SIDE EACH NIGHT BEFORE DYMISTA    [provider]  pentosan polysulfate  (ELMIRON) 100 MG capsule Take 200 mg by mouth 3 (three) times daily before meals.     [provider]  Phosphatidylserine-DHA-EPA (VAYACOG PO) Take 1 capsule by mouth 2 (two) times daily. 06/02/16   [provider]  Potassium 99 MG TABS Take 1 tablet by mouth daily.    [provider]  pravastatin (PRAVACHOL) 40 MG tablet TAKE 1 TABLET BY MOUTH AT BEDTIME 08/28/16   [name], MD  PRESCRIPTION MEDICATION Pt receives weekly allergy shots    [provider]  sertraline (ZOLOFT) 50 MG tablet Take 50 mg by mouth 2 (two) times daily.     [provider]  sodium chloride (OCEAN) 0.65 % SOLN nasal spray Place 1 spray into both nostrils 4 (four) times daily as needed for congestion. Uses each time before other nasal sprays    [provider]  tadalafil (CIALIS) 5 MG tablet Take 5 mg by mouth every evening.     [provider]  triamcinolone cream (KENALOG) 0.1 % APPLY TOPICALLY 4 TIMES A DAY Patient taking differently: APPLY TOPICALLY 2 TIMES A DAY AS NEEDED 06/09/16   Forcucci, Loma Sousa, PA-C  trospium (SANCTURA) 20 MG tablet Take 20 mg by mouth 2 (two) times daily.  06/22/15   [provider]  ZENPEP 25000-79000 units CPEP Take 2 capsules by mouth 3 (three) times daily. 12/04/16   [provider]    Family History Family History  Problem Relation Age of Onset  . Diabetes Paternal Uncle   . Cancer Father        lymphoma, colon  . Diabetes Maternal Grandmother   . Heart disease Maternal Grandfather   . Diabetes Maternal Grandfather   . Diabetes Paternal Grandmother   . Diabetes Paternal Grandfather   . Dementia Mother   . Prostate cancer Maternal Uncle   . Lung disease Neg Hx   . Rheumatologic disease Neg Hx     Social History Social History  Substance Use Topics  . Smoking status: Former Smoker    Packs/day: 0.10    Years: 15.00    Start date: 12/14/1980    Quit date: 05/27/1995  . Smokeless tobacco: Never  Used     Comment: significant second-hand exposure through mother  . Alcohol use 0.6 oz/week    1 Glasses of wine per week     Comment: 2 x a year  Allergies   Bee venom; Ppd [tuberculin purified protein derivative]; Fenofibrate; Levofloxacin; Other; Verapamil; Claritin [loratadine]; and Cymbalta [duloxetine hcl]   Review of Systems Review of Systems  Constitutional: Positive for chills, fatigue and fever.  HENT: Negative.   Eyes: Negative.   Respiratory: Positive for cough and shortness of breath.   Cardiovascular: Positive for chest pain. Negative for leg swelling.  Gastrointestinal: Positive for nausea and vomiting. Negative for abdominal pain.  Genitourinary: Negative for dysuria, frequency and hematuria.  Musculoskeletal: Positive for neck pain. Negative for neck stiffness.  Skin: Positive for wound (surgical).  Neurological: Positive for headaches.     Physical Exam Updated Vital Signs BP (!) 154/77 (BP Location: Right Arm)   Pulse 89   Temp (!) 102.7 F (39.3 C) (Oral)   Resp 18   Ht 5\' 10"  (1.778 m)   Wt (!) 170.1 kg (375 lb)   SpO2 97%   BMI 53.81 kg/m   Physical Exam  Constitutional: He is oriented to person, place, and time. He appears well-developed and well-nourished. No distress.  HENT:  Head: Normocephalic and atraumatic.  Mouth/Throat: Oropharynx is clear and moist.  Eyes: Pupils are equal, round, and reactive to light. Conjunctivae and EOM are normal.  Neck: Normal range of motion. Neck supple. No tracheal deviation present.  No nuchal rigidity  Cardiovascular: Normal rate, regular rhythm and intact distal pulses.   Pulmonary/Chest: Effort normal and breath sounds normal. No stridor. He has no wheezes. He has no rales. He exhibits no tenderness.  Abdominal: Soft. Bowel sounds are normal. He exhibits no mass. There is no tenderness. There is no rebound and no guarding. No hernia.  Obese abdomen  Musculoskeletal:  Left knee with well approximated  surgical wounds, no surrounding erythema or purulent drainage. Knee is not warm or red. Appropriate tenderness to palpation after surgical procedure. Bilateral calves are nontender, not edematous or erythematous.  Neurological: He is alert and oriented to person, place, and time.  Skin: Skin is warm.  Psychiatric: He has a normal mood and affect. His behavior is normal.  Nursing note and vitals reviewed.    ED Treatments / Results  Labs (all labs ordered are listed, but only abnormal results are displayed) Labs Reviewed  COMPREHENSIVE METABOLIC PANEL - Abnormal; Notable for the following:       Result Value   Chloride 100 (*)    CO2 33 (*)    Total Protein 6.3 (*)    Albumin 3.4 (*)    All other components within normal limits  URINALYSIS, ROUTINE W REFLEX MICROSCOPIC - Abnormal; Notable for the following:    Color, Urine STRAW (*)    All other components within normal limits  CULTURE, BLOOD (ROUTINE X 2)  CULTURE, BLOOD (ROUTINE X 2)  CBC WITH DIFFERENTIAL/PLATELET  PROTIME-INR  I-STAT CG4 LACTIC ACID, ED    EKG  EKG Interpretation None       Radiology Dg Chest 2 View  Result Date: 01/03/2017 CLINICAL DATA:  56 y/o M; left knee surgery yesterday presenting with body ache, fever, chills, and headache. EXAM: CHEST  2 VIEW COMPARISON:  07/16/2016 chest radiograph FINDINGS: Stable cardiomegaly. Clear lungs. No pleural effusion or pneumothorax. Mild degenerative changes of the thoracic spine. No acute osseous abnormality is evident. IMPRESSION: No acute pulmonary process identified.  Stable cardiomegaly. Electronically Signed   By: Kristine Garbe M.D.   On: 01/03/2017 22:37   Ct Angio Chest Pe W/cm &/or Wo Cm  Result Date: 01/04/2017 CLINICAL DATA:  Status post left knee surgery, with diffuse body aches, fever and chills. Headache and neck pain. Initial encounter. EXAM: CT ANGIOGRAPHY CHEST WITH CONTRAST TECHNIQUE: Multidetector CT imaging of the chest was performed  using the standard protocol during bolus administration of intravenous contrast. Multiplanar CT image reconstructions and MIPs were obtained to evaluate the vascular anatomy. CONTRAST:  100 mL of Isovue 370 IV contrast COMPARISON:  Chest radiograph performed 01/03/2017, and CT of the chest performed 03/06/2011 FINDINGS: Cardiovascular:  There is no evidence of central pulmonary embolus. The heart remains normal in size. The thoracic aorta is unremarkable. The great vessels within normal limits. Mediastinum/Nodes: No mediastinal lymphadenopathy is seen. No pericardial effusion is identified. The visualized portions of the thyroid gland are unremarkable. No axillary lymphadenopathy is appreciated. Lungs/Pleura: The lungs appear grossly clear. No focal consolidation, pleural effusion or pneumothorax is seen. No masses are identified. Upper Abdomen: There is diffuse fatty infiltration within the liver. The visualized portions of the spleen are unremarkable. Musculoskeletal: No acute osseous abnormalities are identified. Anterior bridging osteophytes are noted along the lower thoracic spine. The visualized musculature is unremarkable in appearance. Review of the MIP images confirms the above findings. IMPRESSION: 1. No evidence of central pulmonary embolus. 2. Lungs clear bilaterally. 3. Diffuse fatty infiltration within the liver. Electronically Signed   By: Garald Balding M.D.   On: 01/04/2017 00:43    Procedures Procedures (including critical care time)  Medications Ordered in ED Medications  sodium chloride 0.9 % injection (not administered)  iopamidol (ISOVUE-370) 76 % injection (not administered)  sodium chloride 0.9 % bolus 1,000 mL (0 mLs Intravenous Stopped 01/04/17 0038)  acetaminophen (TYLENOL) tablet 650 mg (650 mg Oral Given 01/03/17 2304)  iopamidol (ISOVUE-370) 76 % injection 100 mL (100 mLs Intravenous Contrast Given 01/03/17 2351)  acetaminophen (TYLENOL) tablet 650 mg (650 mg Oral Given  01/04/17 0113)     Initial Impression / Assessment and Plan / ED Course  I have reviewed the triage vital signs and the nursing notes.  Pertinent labs & imaging results that were available during my care of the patient were reviewed by me and considered in my medical decision making (see chart for details).     Pt presenting with fever, myalgias, shortness of breath status post meniscal repair done yesterday. Questionable aspiration during episode of emesis overnight. Tmax in ED 103.1. O2 saturation 89% on arrival. Patient placed on 2 L nasal cannula. Patient without signs of DVT, no urinary symptoms, abdomen exam is benign. Surgical wounds without signs of infection. CTA of the chest done without evidence of PE or pneumonia. CMP, CBC, UA unremarkable. Normal lactic. Blood cultures pending. Pt with SOB after ambulating to restroom, with O2 at 91% on 2L following ambulation. Given patient's presentation with hypoxia and fever of unknown source, will consult hospitalist for admission. Dr. Blaine Hamper to see the patient and is accepting admission.  Patient discussed with Dr. Gilford Raid.  The patient appears reasonably stabilized for admission considering the current resources, flow, and capabilities available in the ED at this time, and I doubt any other [facility] requiring further screening and/or treatment in the ED prior to admission.   Final Clinical Impressions(s) / ED Diagnoses   Final diagnoses:  Post-procedural fever    New Prescriptions New Prescriptions   No medications on file     Russo, [place] N, PA-C 01/04/17 0117    Isla Pence, MD 01/04/17 1223

## 2017-01-03 NOTE — ED Triage Notes (Signed)
States had left knee surgery at [facility] and now aching all over body with fever and chills headache and neck pain voiced.

## 2017-01-04 ENCOUNTER — Encounter (HOSPITAL_COMMUNITY): Payer: Self-pay

## 2017-01-04 DIAGNOSIS — A419 Sepsis, unspecified organism: Secondary | ICD-10-CM

## 2017-01-04 DIAGNOSIS — I10 Essential (primary) hypertension: Secondary | ICD-10-CM | POA: Diagnosis not present

## 2017-01-04 DIAGNOSIS — R509 Fever, unspecified: Secondary | ICD-10-CM | POA: Diagnosis not present

## 2017-01-04 DIAGNOSIS — J449 Chronic obstructive pulmonary disease, unspecified: Secondary | ICD-10-CM

## 2017-01-04 DIAGNOSIS — J40 Bronchitis, not specified as acute or chronic: Secondary | ICD-10-CM

## 2017-01-04 DIAGNOSIS — J690 Pneumonitis due to inhalation of food and vomit: Secondary | ICD-10-CM

## 2017-01-04 DIAGNOSIS — J9601 Acute respiratory failure with hypoxia: Secondary | ICD-10-CM

## 2017-01-04 DIAGNOSIS — F329 Major depressive disorder, single episode, unspecified: Secondary | ICD-10-CM | POA: Diagnosis present

## 2017-01-04 DIAGNOSIS — K219 Gastro-esophageal reflux disease without esophagitis: Secondary | ICD-10-CM | POA: Diagnosis not present

## 2017-01-04 DIAGNOSIS — F418 Other specified anxiety disorders: Secondary | ICD-10-CM | POA: Diagnosis present

## 2017-01-04 DIAGNOSIS — F32A Depression, unspecified: Secondary | ICD-10-CM | POA: Diagnosis present

## 2017-01-04 LAB — URINALYSIS, ROUTINE W REFLEX MICROSCOPIC
BILIRUBIN URINE: NEGATIVE
Glucose, UA: NEGATIVE mg/dL
Hgb urine dipstick: NEGATIVE
Ketones, ur: NEGATIVE mg/dL
Leukocytes, UA: NEGATIVE
NITRITE: NEGATIVE
PROTEIN: NEGATIVE mg/dL
SPECIFIC GRAVITY, URINE: 1.014 (ref 1.005–1.030)
pH: 5 (ref 5.0–8.0)

## 2017-01-04 LAB — RESPIRATORY PANEL BY PCR
ADENOVIRUS-RVPPCR: NOT DETECTED
BORDETELLA PERTUSSIS-RVPCR: NOT DETECTED
CHLAMYDOPHILA PNEUMONIAE-RVPPCR: NOT DETECTED
CORONAVIRUS 229E-RVPPCR: NOT DETECTED
CORONAVIRUS NL63-RVPPCR: NOT DETECTED
Coronavirus HKU1: NOT DETECTED
Coronavirus OC43: NOT DETECTED
INFLUENZA A H1-RVPPCR: NOT DETECTED
INFLUENZA A-RVPPCR: NOT DETECTED
INFLUENZA B-RVPPCR: NOT DETECTED
Influenza A H1 2009: NOT DETECTED
Influenza A H3: NOT DETECTED
Metapneumovirus: NOT DETECTED
Mycoplasma pneumoniae: NOT DETECTED
PARAINFLUENZA VIRUS 1-RVPPCR: NOT DETECTED
PARAINFLUENZA VIRUS 4-RVPPCR: NOT DETECTED
Parainfluenza Virus 2: NOT DETECTED
Parainfluenza Virus 3: NOT DETECTED
RHINOVIRUS / ENTEROVIRUS - RVPPCR: NOT DETECTED
Respiratory Syncytial Virus: NOT DETECTED

## 2017-01-04 LAB — PROCALCITONIN

## 2017-01-04 LAB — HIV ANTIBODY (ROUTINE TESTING W REFLEX): HIV SCREEN 4TH GENERATION: NONREACTIVE

## 2017-01-04 LAB — LACTIC ACID, PLASMA: LACTIC ACID, VENOUS: 1.1 mmol/L (ref 0.5–1.9)

## 2017-01-04 MED ORDER — ACETAMINOPHEN 325 MG PO TABS
650.0000 mg | ORAL_TABLET | Freq: Once | ORAL | Status: AC
  Administered 2017-01-04: 650 mg via ORAL
  Filled 2017-01-04: qty 2

## 2017-01-04 MED ORDER — PANCRELIPASE (LIP-PROT-AMYL) 12000-38000 UNITS PO CPEP
48000.0000 [IU] | ORAL_CAPSULE | Freq: Three times a day (TID) | ORAL | Status: DC
  Administered 2017-01-04 – 2017-01-05 (×4): 48000 [IU] via ORAL
  Filled 2017-01-04 (×4): qty 4

## 2017-01-04 MED ORDER — ADULT MULTIVITAMIN W/MINERALS CH
1.0000 | ORAL_TABLET | Freq: Every day | ORAL | Status: DC
Start: 2017-01-04 — End: 2017-01-05
  Administered 2017-01-04 – 2017-01-05 (×2): 1 via ORAL
  Filled 2017-01-04 (×2): qty 1

## 2017-01-04 MED ORDER — VITAMIN D3 25 MCG (1000 UNIT) PO TABS
10000.0000 [IU] | ORAL_TABLET | Freq: Every day | ORAL | Status: DC
  Administered 2017-01-04: 10000 [IU] via ORAL
  Filled 2017-01-04: qty 10

## 2017-01-04 MED ORDER — OXYCODONE-ACETAMINOPHEN 5-325 MG PO TABS: 1.0000 | ORAL_TABLET | ORAL | Status: DC | PRN

## 2017-01-04 MED ORDER — AZELASTINE-FLUTICASONE 137-50 MCG/ACT NA SUSP: 1.0000 | Freq: Two times a day (BID) | NASAL | Status: DC

## 2017-01-04 MED ORDER — DESMOPRESSIN ACETATE 0.2 MG PO TABS
200.0000 ug | ORAL_TABLET | Freq: Every day | ORAL | Status: DC
  Administered 2017-01-04 (×2): 200 ug via ORAL
  Filled 2017-01-04 (×2): qty 1

## 2017-01-04 MED ORDER — DONEPEZIL HCL 23 MG PO TABS
23.0000 mg | ORAL_TABLET | Freq: Every day | ORAL | Status: DC
  Administered 2017-01-04 (×2): 23 mg via ORAL
  Filled 2017-01-04 (×2): qty 1

## 2017-01-04 MED ORDER — CALCIUM CARBONATE-VITAMIN D 500-200 MG-UNIT PO TABS
1.0000 | ORAL_TABLET | Freq: Two times a day (BID) | ORAL | Status: DC
  Administered 2017-01-04 – 2017-01-05 (×4): 1 via ORAL
  Filled 2017-01-04 (×4): qty 1

## 2017-01-04 MED ORDER — VITAMIN D3 25 MCG (1000 UNIT) PO TABS
20000.0000 [IU] | ORAL_TABLET | Freq: Every day | ORAL | Status: DC
  Administered 2017-01-04 – 2017-01-05 (×2): 20000 [IU] via ORAL
  Filled 2017-01-04: qty 20

## 2017-01-04 MED ORDER — OMEGA-3 KRILL OIL 1000 MG PO CAPS
1000.0000 mg | ORAL_CAPSULE | Freq: Two times a day (BID) | ORAL | Status: DC
Start: 2017-01-04 — End: 2017-01-04

## 2017-01-04 MED ORDER — PANTOPRAZOLE SODIUM 40 MG PO TBEC
40.0000 mg | DELAYED_RELEASE_TABLET | Freq: Every day | ORAL | Status: DC
  Administered 2017-01-04 – 2017-01-05 (×2): 40 mg via ORAL
  Filled 2017-01-04 (×2): qty 1

## 2017-01-04 MED ORDER — VANCOMYCIN HCL 10 G IV SOLR
1500.0000 mg | Freq: Two times a day (BID) | INTRAVENOUS | Status: DC
  Filled 2017-01-04: qty 1500

## 2017-01-04 MED ORDER — HYDRALAZINE HCL 20 MG/ML IJ SOLN
5.0000 mg | INTRAMUSCULAR | Status: DC | PRN
  Filled 2017-01-04: qty 0.25

## 2017-01-04 MED ORDER — ALFUZOSIN HCL ER 10 MG PO TB24
10.0000 mg | ORAL_TABLET | Freq: Every day | ORAL | Status: DC
  Administered 2017-01-05: 10 mg via ORAL
  Filled 2017-01-04 (×3): qty 1

## 2017-01-04 MED ORDER — GUAIFENESIN ER 600 MG PO TB12: 600.0000 mg | ORAL_TABLET | Freq: Two times a day (BID) | ORAL | Status: DC | PRN

## 2017-01-04 MED ORDER — BISOPROLOL FUMARATE 5 MG PO TABS
5.0000 mg | ORAL_TABLET | Freq: Every day | ORAL | Status: DC
  Administered 2017-01-04 – 2017-01-05 (×2): 5 mg via ORAL
  Filled 2017-01-04 (×2): qty 1

## 2017-01-04 MED ORDER — CETAPHIL MOISTURIZING EX LOTN
TOPICAL_LOTION | Freq: Two times a day (BID) | CUTANEOUS | Status: DC
  Administered 2017-01-04 – 2017-01-05 (×4): via TOPICAL
  Filled 2017-01-04: qty 473

## 2017-01-04 MED ORDER — CINNAMON 500 MG PO CAPS: 500.0000 mg | ORAL_CAPSULE | Freq: Two times a day (BID) | ORAL | Status: DC

## 2017-01-04 MED ORDER — PIPERACILLIN-TAZOBACTAM 3.375 G IVPB 30 MIN
3.3750 g | INTRAVENOUS | Status: AC
  Administered 2017-01-04: 3.375 g via INTRAVENOUS
  Filled 2017-01-04: qty 50

## 2017-01-04 MED ORDER — PENTOSAN POLYSULFATE SODIUM 100 MG PO CAPS: 200.0000 mg | ORAL_CAPSULE | Freq: Three times a day (TID) | ORAL | Status: DC

## 2017-01-04 MED ORDER — ENOXAPARIN SODIUM 40 MG/0.4ML ~~LOC~~ SOLN
40.0000 mg | SUBCUTANEOUS | Status: DC
  Administered 2017-01-04 – 2017-01-05 (×2): 40 mg via SUBCUTANEOUS
  Filled 2017-01-04 (×2): qty 0.4

## 2017-01-04 MED ORDER — OXYMETAZOLINE HCL 0.05 % NA SOLN
1.0000 | Freq: Every day | NASAL | Status: DC
  Administered 2017-01-04: 1 via NASAL
  Filled 2017-01-04: qty 15

## 2017-01-04 MED ORDER — MEMANTINE HCL 10 MG PO TABS
10.0000 mg | ORAL_TABLET | Freq: Two times a day (BID) | ORAL | Status: DC
  Administered 2017-01-04 – 2017-01-05 (×4): 10 mg via ORAL
  Filled 2017-01-04 (×4): qty 1

## 2017-01-04 MED ORDER — GABAPENTIN 300 MG PO CAPS
1800.0000 mg | ORAL_CAPSULE | Freq: Three times a day (TID) | ORAL | Status: DC
  Administered 2017-01-04 – 2017-01-05 (×4): 1800 mg via ORAL
  Filled 2017-01-04 (×4): qty 6

## 2017-01-04 MED ORDER — PRAVASTATIN SODIUM 40 MG PO TABS
40.0000 mg | ORAL_TABLET | Freq: Every day | ORAL | Status: DC
  Administered 2017-01-04 (×2): 40 mg via ORAL
  Filled 2017-01-04 (×2): qty 1

## 2017-01-04 MED ORDER — MOMETASONE FURO-FORMOTEROL FUM 200-5 MCG/ACT IN AERO
2.0000 | INHALATION_SPRAY | Freq: Two times a day (BID) | RESPIRATORY_TRACT | Status: DC
  Administered 2017-01-04 – 2017-01-05 (×3): 2 via RESPIRATORY_TRACT
  Filled 2017-01-04: qty 8.8

## 2017-01-04 MED ORDER — LORATADINE 10 MG PO TABS: 10.0000 mg | ORAL_TABLET | Freq: Every day | ORAL | Status: DC

## 2017-01-04 MED ORDER — MONTELUKAST SODIUM 10 MG PO TABS
10.0000 mg | ORAL_TABLET | Freq: Every day | ORAL | Status: DC
  Administered 2017-01-04 – 2017-01-05 (×2): 10 mg via ORAL
  Filled 2017-01-04 (×2): qty 1

## 2017-01-04 MED ORDER — CEREFOLIN NAC 6-90.314-2-600 MG PO TABS: 1.0000 | ORAL_TABLET | Freq: Every day | ORAL | Status: DC

## 2017-01-04 MED ORDER — PENTOSAN POLYSULFATE SODIUM 100 MG PO CAPS
200.0000 mg | ORAL_CAPSULE | Freq: Three times a day (TID) | ORAL | Status: DC
  Administered 2017-01-04 – 2017-01-05 (×5): 200 mg via ORAL
  Filled 2017-01-04 (×6): qty 2

## 2017-01-04 MED ORDER — TRIAMCINOLONE ACETONIDE 0.1 % EX CREA
TOPICAL_CREAM | Freq: Two times a day (BID) | CUTANEOUS | Status: DC
  Administered 2017-01-04 (×3): via TOPICAL
  Filled 2017-01-04: qty 15

## 2017-01-04 MED ORDER — SODIUM CHLORIDE 0.9 % IV BOLUS (SEPSIS)
2000.0000 mL | Freq: Once | INTRAVENOUS | Status: AC
  Administered 2017-01-04: 2000 mL via INTRAVENOUS

## 2017-01-04 MED ORDER — PIPERACILLIN-TAZOBACTAM 3.375 G IVPB
3.3750 g | Freq: Three times a day (TID) | INTRAVENOUS | Status: DC
  Administered 2017-01-04 – 2017-01-05 (×3): 3.375 g via INTRAVENOUS
  Filled 2017-01-04 (×4): qty 50

## 2017-01-04 MED ORDER — OXYCODONE-ACETAMINOPHEN 10-325 MG PO TABS
1.0000 | ORAL_TABLET | ORAL | Status: DC | PRN
Start: 2017-01-04 — End: 2017-01-04

## 2017-01-04 MED ORDER — SENNOSIDES-DOCUSATE SODIUM 8.6-50 MG PO TABS: 1.0000 | ORAL_TABLET | Freq: Every evening | ORAL | Status: DC | PRN

## 2017-01-04 MED ORDER — AZELASTINE HCL 0.1 % NA SOLN
1.0000 | Freq: Two times a day (BID) | NASAL | Status: DC
  Administered 2017-01-04 – 2017-01-05 (×3): 1 via NASAL
  Filled 2017-01-04: qty 30

## 2017-01-04 MED ORDER — ALBUTEROL SULFATE (2.5 MG/3ML) 0.083% IN NEBU: 2.5000 mg | INHALATION_SOLUTION | RESPIRATORY_TRACT | Status: DC | PRN

## 2017-01-04 MED ORDER — ACETAMINOPHEN 325 MG PO TABS
650.0000 mg | ORAL_TABLET | Freq: Four times a day (QID) | ORAL | Status: DC | PRN
  Administered 2017-01-05: 650 mg via ORAL
  Filled 2017-01-04: qty 2

## 2017-01-04 MED ORDER — SALINE SPRAY 0.65 % NA SOLN: 1.0000 | Freq: Four times a day (QID) | NASAL | Status: DC | PRN

## 2017-01-04 MED ORDER — ZOLPIDEM TARTRATE 5 MG PO TABS
5.0000 mg | ORAL_TABLET | Freq: Every evening | ORAL | Status: DC | PRN
Start: 2017-01-04 — End: 2017-01-05

## 2017-01-04 MED ORDER — CYCLOBENZAPRINE HCL 10 MG PO TABS
10.0000 mg | ORAL_TABLET | Freq: Three times a day (TID) | ORAL | Status: DC | PRN
  Administered 2017-01-05: 10 mg via ORAL
  Filled 2017-01-04: qty 1

## 2017-01-04 MED ORDER — VANCOMYCIN HCL 10 G IV SOLR
2500.0000 mg | Freq: Once | INTRAVENOUS | Status: AC
  Administered 2017-01-04: 2500 mg via INTRAVENOUS
  Filled 2017-01-04: qty 2000

## 2017-01-04 MED ORDER — PHOSPHATIDYLSERINE-DHA-EPA 100-19.5-6.5 MG PO CAPS: 1.0000 | ORAL_CAPSULE | Freq: Two times a day (BID) | ORAL | Status: DC

## 2017-01-04 MED ORDER — OXYCODONE HCL 5 MG PO TABS: 5.0000 mg | ORAL_TABLET | ORAL | Status: DC | PRN

## 2017-01-04 MED ORDER — IPRATROPIUM BROMIDE 0.02 % IN SOLN: 0.5000 mg | RESPIRATORY_TRACT | Status: DC

## 2017-01-04 MED ORDER — BACLOFEN 10 MG PO TABS: 10.0000 mg | ORAL_TABLET | Freq: Three times a day (TID) | ORAL | Status: DC | PRN

## 2017-01-04 MED ORDER — LUTEIN 6 MG PO CAPS: 1.0000 | ORAL_CAPSULE | Freq: Two times a day (BID) | ORAL | Status: DC

## 2017-01-04 MED ORDER — SERTRALINE HCL 50 MG PO TABS
50.0000 mg | ORAL_TABLET | Freq: Two times a day (BID) | ORAL | Status: DC
  Administered 2017-01-04 – 2017-01-05 (×4): 50 mg via ORAL
  Filled 2017-01-04 (×4): qty 1

## 2017-01-04 MED ORDER — POLYETHYLENE GLYCOL 3350 17 G PO PACK: 17.0000 g | PACK | Freq: Every day | ORAL | Status: DC | PRN

## 2017-01-04 MED ORDER — ONDANSETRON HCL 4 MG/2ML IJ SOLN: 4.0000 mg | Freq: Three times a day (TID) | INTRAMUSCULAR | Status: DC | PRN

## 2017-01-04 MED ORDER — SODIUM CHLORIDE 0.9 % IV SOLN
INTRAVENOUS | Status: DC
  Administered 2017-01-04: 04:00:00 via INTRAVENOUS

## 2017-01-04 MED ORDER — FLUTICASONE PROPIONATE 50 MCG/ACT NA SUSP
1.0000 | Freq: Two times a day (BID) | NASAL | Status: DC
  Administered 2017-01-04 – 2017-01-05 (×3): 1 via NASAL
  Filled 2017-01-04: qty 16

## 2017-01-04 NOTE — Progress Notes (Signed)
PROGRESS NOTE  Subjective: Mark Harrell is a 56 y.o. male with a history of HTN, HLD, asthma on mepolizumab, morbid obesity, and OSA who presented to the ED 8/11 with fever and cough after awakening choking on vomit with CPAP. The previous day he underwent arthroscopic left knee meniscus repair under conscious sedation with spinal block and notes no complications. On arrival he was febrile to 103.73F and hypoxic, 89% on room air. Work up, including CXR and CTA chest has revealed no evidence of infection or PE. Surgical sites appear normal. No leukocytosis, lactic acid normal, procalcitonin is undetectable. Broad spectrum antibiotics started, cultures drawn, and he was admitted early this morning by Dr. Blaine Hamper. Since then he has slept and denies current dyspnea, cough, fever. He had myalgias and diffuse headache that are resolved.   Objective: BP 114/63 (BP Location: Right Arm)   Pulse 68   Temp 98.7 F (37.1 C) (Oral)   Resp 18   Ht 6' (1.829 m)   Wt (!) 177.5 kg (391 lb 5.1 oz)   SpO2 97%   BMI 53.07 kg/m   Gen: Obese, pleasant male sitting on side of bed in no distress Pulm: Clear, distant, mildly labored after recent ambulation to bathroom (not his baseline)  CV: RRR, no murmur, no JVD, no edema GI: Soft, NT, ND, +BS  Skin: Left knee arthroscopic sites c/d/i without significant erythema  Assessment & Plan: This is a morbidly obese 56yo male with baseline pulmonary impairment (asthma and OSA) on immunosuppression (mepolizumab) admitted for fever and acute hypoxia following what must have been an aspiration event (woke up vomiting with CPAP on, states he inhaled emesis).   Suspect aspiration pneumonitis is primary cause of presentation. Infiltrate would not be expected this early following event, though he is at high risk for aspiration pneumonia. There is no wheezing to suggest asthma exacerbation. Has remained hemodynamically stable. Had neck pain but this is resolving and no meningitic  signs on exam. - Continue zosyn - Follow cultures  - Wean oxygen as able - Continue home medications for chronic conditions including CPAP qHS - If stable over next 24 hours, would discharge home.   [name], MD [facility] Pager [phone] 01/04/2017, 4:26 PM

## 2017-01-04 NOTE — H&P (Signed)
History and Physical    Mark Harrell HRC:[id number] DOB: [birth year] DOA: 01/03/2017  Referring MD/NP/PA:   PCP: [name], MD   Patient coming from:  The patient is coming from home.  At baseline, pt is independent for most of ADL.  Chief Complaint: Fever, chills, cough, SOB, nausea, vomiting, headache  HPI: Mark Harrell is a 56 y.o. male with medical history significant of hypertension, hyperlipidemia, COPD, asthma, GERD, depression, anxiety, OSA, morbid obesity, IBS, BPH, prostatitis, overactive bladder, who presents with fever, chills, cough, chest pain, nausea, vomiting, headache.  Pt underwent left knee meniscus repair by Dr. [name] on 01/02/2017. Procedure was done under conscious sedation with spinal block. Per procedure note there were no complications of anesthesia. He has mild pain in the surgical site.   Pt states that overnight he vomited in his sleep and woke up choking and coughing. He also has fever, chills, shortness of breath and productive cough. He had mildly chest pain earlier, which has resolved currently. He speaks in full sentence. He reports generalized weakness, malaise, headache. No abdominal pain. He is constipated. He denies symptoms of UTI or unilateral weakness. Of note, pt is on Mepolizumab injection monthly for asthma. Pt reports headache radiating to occipital area, but no neck rigidity or photophobia.  ED Course: pt was found to have temperature 103.1, respirations 20, oxygen saturation 89% on room air, WBC 9.3, lactic acid 1.51, INR 1.03, negative urinalysis, electronically renal function okay, chest x-ray negative for infiltration. CT angiogram of the chest is negative for PE. Patient is placed on telemetry bed for observation.  Review of Systems:   General: has fevers, chills, no body weight gain, has poor appetite, has fatigue HEENT: no blurry vision, hearing changes or sore throat Respiratory: has dyspnea, coughing, no wheezing CV: had chest  pain, no palpitations GI: has nausea, vomiting, no abdominal pain, diarrhea, has constipation GU: no dysuria, burning on urination, increased urinary frequency, hematuria  Ext: no leg edema Neuro: no unilateral weakness, numbness, or tingling, no vision change or hearing loss Skin: no rash, no skin tear. MSK: s/p surgery in left knee with mild pain Heme: No easy bruising.  Travel history: No recent long distant travel.  Allergy:  Allergies  Allergen Reactions  . Bee Venom Swelling  . Ppd [Tuberculin Purified Protein Derivative] Other (See Comments)    +ppd NEG Quantferron Gold 3/13  . Fenofibrate Other (See Comments)    Back pain  . Levofloxacin Diarrhea  . Other Other (See Comments)    Some antibiotics cause diarrhea  . Verapamil Other (See Comments)    Back pain  . Claritin [Loratadine] Other (See Comments)  . Cymbalta [Duloxetine Hcl] Other (See Comments)    unknown    Past Medical History:  Diagnosis Date  . Anesthesia complication requiring reversal agent administration    ? from central apnea, very difficult to get off vent  . Anxiety   . Arthritis    osteo  . Asthma   . BPH (benign prostatic hyperplasia)   . Complication of anesthesia    difficulty waking , they twlight me because of my respiratory problems "  . Dyspnea   . Enlarged heart   . Family history of adverse reaction to anesthesia    mother trouble waking up, and heart stopped  . GERD (gastroesophageal reflux disease)   . Headache    botox injections for headaches  . Hyperlipidemia   . Hypertension   . Hypogonadism male   . IBS (  irritable bowel syndrome)   . Obesity   . OSA (obstructive sleep apnea)    cpap  . Pneumonia   . Pre-diabetes   . Prostatitis     Past Surgical History:  Procedure Laterality Date  . ABDOMINAL SURGERY    . ANKLE FRACTURE SURGERY Right   . CYSTOSCOPY     [place]  . KNEE ARTHROSCOPY WITH MEDIAL MENISECTOMY Left 01/02/2017   Procedure: LEFT KNEE ARTHROSCOPY WITH  PARTIAL MEDIAL MENISCECTOMY;  Surgeon: Mcarthur Rossetti, MD;  Location: [facility];  Service: Orthopedics;  Laterality: Left;  . TONSILLECTOMY    . [place] RESECTION  2007  . UVULOPALATOPHARYNGOPLASTY      Social History:  reports that he quit smoking about 21 years ago. He started smoking about 36 years ago. He has a 1.50 pack-year smoking history. He has never used smokeless tobacco. He reports that he drinks about 0.6 oz of alcohol per week . He reports that he does not use drugs.  Family History:  Family History  Problem Relation Age of Onset  . Diabetes Paternal Uncle   . Cancer Father        lymphoma, colon  . Diabetes Maternal Grandmother   . Heart disease Maternal Grandfather   . Diabetes Maternal Grandfather   . Diabetes Paternal Grandmother   . Diabetes Paternal Grandfather   . Dementia Mother   . Prostate cancer Maternal Uncle   . Lung disease Neg Hx   . Rheumatologic disease Neg Hx      Prior to Admission medications   Medication Sig Start Date End Date Taking? Authorizing Provider  albuterol (PROAIR HFA) 108 (90 Base) MCG/ACT inhaler INHALE 2 PUFFS INTO THE LUNGS EVERY 4 (FOUR) HOURS AS NEEDED FOR WHEEZING OR SHORTNESS OF BREATH. 08/13/15   [name], MD  albuterol (PROVENTIL) (2.5 MG/3ML) 0.083% nebulizer solution Inhale 2.5 mg into the lungs every 4 (four) hours as needed. 05/27/16   [provider]  alfuzosin (UROXATRAL) 10 MG 24 hr tablet Take 10 mg by mouth daily.      [provider]  baclofen (LIORESAL) 10 MG tablet Take 10 mg by mouth 3 (three) times daily as needed for muscle spasms.  03/03/16   [provider]  bisoprolol-hydrochlorothiazide (ZIAC) 5-6.25 MG tablet Take 1 tablet by mouth daily. 09/09/16   Vicie Mutters, PA-C  budesonide-formoterol [facility]) 160-4.5 MCG/ACT inhaler Inhale 2 puffs into the lungs 2 (two) times daily. 10/22/16   [name], MD  Calcium Carbonate-Vitamin D (CALCIUM + D PO) Take 500 mg  by mouth 2 (two) times daily.     [provider]  cetaphil (CETAPHIL) lotion Apply topically 2 (two) times daily. 06/09/16   Lavina Hamman, MD  cetirizine (ZYRTEC) 10 MG tablet Take 10 mg by mouth daily.    [provider]  Cholecalciferol (VITAMIN D PO) Take 10,000-20,000 Units by mouth 2 (two) times daily. 20000 in the morning and 10000 in the evening    [provider]  Cinnamon 500 MG capsule Take 500 mg by mouth 2 (two) times daily.     [provider]  cyclobenzaprine (FLEXERIL) 10 MG tablet Take 10 mg by mouth 3 (three) times daily as needed for muscle spasms.    [provider]  desmopressin (DDAVP) 0.2 MG tablet Take 200 mcg by mouth at bedtime. 04/10/14   [provider]  donepezil (ARICEPT) 23 MG TABS tablet Take 23 mg by mouth at bedtime.    [provider]  DYMISTA 137-50 MCG/ACT SUSP PLACE 2 SPRAYS INTO BOTH NOSTRILS 2 (TWO) TIMES DAILY. 04/10/16   [provider]  Eluxadoline (VIBERZI) 100 MG TABS Take 1 tablet by mouth 2 (two) times daily.     [provider]  esomeprazole (NEXIUM) 20 MG capsule Take 20 mg by mouth daily. 04/29/16   [provider]  gabapentin (NEURONTIN) 600 MG tablet 1,800 mg 3 (three) times daily.  07/28/13   [provider]  guaiFENesin (MUCINEX) 600 MG 12 hr tablet Take 600 mg by mouth 2 (two) times daily as needed for cough or to loosen phlegm.     [provider]  LUTEIN PO Take 1 tablet by mouth 2 (two) times daily.    [provider]  memantine (NAMENDA) 10 MG tablet Take 10 mg by mouth 2 (two) times daily.     [provider]  Mepolizumab (NUCALA) 100 MG SOLR Pt receives injection once a month    [provider]  Methylfol-Algae-B12-Acetylcyst (CEREFOLIN NAC) 6-90.314-2-600 MG TABS Take 1 tablet by mouth daily.  08/03/12   [provider]  Misc Natural Products (TURMERIC CURCUMIN) CAPS Take 1 capsule by mouth 2 (two)  times daily.    [provider]  montelukast (SINGULAIR) 10 MG tablet TAKE 1 TABLET (10 MG TOTAL) BY MOUTH DAILY. 08/28/16   [name], MD  Multiple Vitamins-Minerals (MULTIVITAMIN WITH MINERALS) tablet Take 1 tablet by mouth daily.    [provider]  Omega-3 Krill Oil 1000 MG CAPS Take 1,000 mg by mouth 2 (two) times daily.    [provider]  ondansetron (ZOFRAN ODT) 4 MG disintegrating tablet Take 1 tablet (4 mg total) by mouth every 6 (six) hours as needed for nausea or vomiting. 07/24/16   Vicie Mutters, PA-C  oxyCODONE-acetaminophen (PERCOCET) 10-325 MG tablet Take 1 tablet by mouth every 4 (four) hours as needed for pain. 01/02/17   Mcarthur Rossetti, MD  oxymetazoline (AFRIN) 0.05 % nasal spray Place 1 spray into both nostrils See admin instructions. 1 SPRAY PER SIDE EACH NIGHT BEFORE DYMISTA    [provider]  pentosan polysulfate (ELMIRON) 100 MG capsule Take 200 mg by mouth 3 (three) times daily before meals.     [provider]  Phosphatidylserine-DHA-EPA (VAYACOG PO) Take 1 capsule by mouth 2 (two) times daily. 06/02/16   [provider]  Potassium 99 MG TABS Take 1 tablet by mouth daily.    [provider]  pravastatin (PRAVACHOL) 40 MG tablet TAKE 1 TABLET BY MOUTH AT BEDTIME 08/28/16   [name], MD  PRESCRIPTION MEDICATION Pt receives weekly allergy shots    [provider]  sertraline (ZOLOFT) 50 MG tablet Take 50 mg by mouth 2 (two) times daily.     [provider]  sodium chloride (OCEAN) 0.65 % SOLN nasal spray Place 1 spray into both nostrils 4 (four) times daily as needed for congestion. Uses each time before other nasal sprays    [provider]  tadalafil (CIALIS) 5 MG tablet Take 5 mg by mouth every evening.     [provider]  triamcinolone cream (KENALOG) 0.1 % APPLY TOPICALLY 4 TIMES A DAY Patient taking differently: APPLY TOPICALLY 2 TIMES A DAY AS NEEDED  06/09/16   Forcucci, Loma Sousa, PA-C  trospium (SANCTURA) 20 MG tablet Take 20 mg by mouth 2 (two) times daily.  06/22/15   [provider]  ZENPEP 25000-79000 units CPEP Take 2 capsules by mouth 3 (three) times daily.  12/04/16   [provider]    Physical Exam: Vitals:   01/03/17 2200 01/03/17 2201 01/03/17 2340 01/04/17 0054  BP: 135/78  126/80 (!) 154/77  Pulse:   90 89  Resp: 20  20 18   Temp: (!) 102.6 F (39.2 C)  (S) (!) 103.1 F (39.5 C) (!) 102.7 F (39.3 C)  TempSrc: Oral  (S) Oral Oral  SpO2: 90%  99% 97%  Weight:  (!) 170.1 kg (375 lb)    Height:  5\' 10"  (1.778 m)     General: Not in acute distress HEENT:       Eyes: PERRL, EOMI, no scleral icterus.       ENT: No discharge from the ears and nose, no pharynx injection, no tonsillar enlargement.        Neck: No JVD, no bruit, no mass felt. Heme: No neck lymph node enlargement. Cardiac: S1/S2, RRR, No murmurs, No gallops or rubs. Respiratory: No rales, wheezing, rhonchi or rubs. GI: Soft, nondistended, nontender, no rebound pain, no organomegaly, BS present. GU: No hematuria Ext: No pitting leg edema bilaterally. 2+DP/PT pulse bilaterally. Musculoskeletal:  S/p of surgery in left knee, surgical site clean, no surrounding erythema or purulent drainage. Knee is not warm or red.  Skin: No rashes.  Neuro: Alert, oriented X3, cranial nerves II-XII grossly intact, moves all extremities normally. Neck is supple. Psych: Patient is not psychotic, no suicidal or hemocidal ideation.  Labs on Admission: I have personally reviewed following labs and imaging studies  CBC:  Recent Labs Lab 01/03/17 2204  WBC 9.3  NEUTROABS 5.6  HGB 13.6  HCT 42.9  MCV 91.1  PLT 976   Basic Metabolic Panel:  Recent Labs Lab 01/03/17 2204  NA 140  K 4.0  CL 100*  CO2 33*  GLUCOSE 92  BUN 17  CREATININE 0.95  CALCIUM 9.0   GFR: Estimated Creatinine Clearance: 137.3 mL/min (by C-G formula based on SCr of 0.95  mg/dL). Liver Function Tests:  Recent Labs Lab 01/03/17 2204  AST 21  ALT 23  ALKPHOS 60  BILITOT 1.2  PROT 6.3*  ALBUMIN 3.4*   No results for input(s): LIPASE, AMYLASE in the last 168 hours. No results for input(s): AMMONIA in the last 168 hours. Coagulation Profile:  Recent Labs Lab 01/03/17 2204  INR 1.03   Cardiac Enzymes: No results for input(s): CKTOTAL, CKMB, CKMBINDEX, TROPONINI in the last 168 hours. BNP (last 3 results) No results for input(s): PROBNP in the last 8760 hours. HbA1C: No results for input(s): HGBA1C in the last 72 hours. CBG: No results for input(s): GLUCAP in the last 168 hours. Lipid Profile: No results for input(s): CHOL, HDL, LDLCALC, TRIG, CHOLHDL, LDLDIRECT in the last 72 hours. Thyroid Function Tests: No results for input(s): TSH, T4TOTAL, FREET4, T3FREE, THYROIDAB in the last 72 hours. Anemia Panel: No results for input(s): VITAMINB12, FOLATE, FERRITIN, TIBC, IRON, RETICCTPCT in the last 72 hours. Urine analysis:    Component Value Date/Time   COLORURINE STRAW (A) 01/03/2017 2204   APPEARANCEUR CLEAR 01/03/2017 2204   LABSPEC 1.014 01/03/2017 2204   PHURINE 5.0 01/03/2017 2204   GLUCOSEU NEGATIVE 01/03/2017 2204   HGBUR NEGATIVE 01/03/2017 2204   BILIRUBINUR NEGATIVE 01/03/2017 2204   KETONESUR NEGATIVE 01/03/2017 2204   PROTEINUR NEGATIVE 01/03/2017 2204   NITRITE NEGATIVE 01/03/2017 2204   LEUKOCYTESUR NEGATIVE 01/03/2017 2204   Sepsis Labs: @LABRCNTIP (procalcitonin:4,lacticidven:4) )No results found for this or any previous visit (from the past 240 hour(s)).   Radiological  Exams on Admission: Dg Chest 2 View  Result Date: 01/03/2017 CLINICAL DATA:  56 y/o M; left knee surgery yesterday presenting with body ache, fever, chills, and headache. EXAM: CHEST  2 VIEW COMPARISON:  07/16/2016 chest radiograph FINDINGS: Stable cardiomegaly. Clear lungs. No pleural effusion or pneumothorax. Mild degenerative changes of the thoracic  spine. No acute osseous abnormality is evident. IMPRESSION: No acute pulmonary process identified.  Stable cardiomegaly. Electronically Signed   By: Kristine Garbe M.D.   On: 01/03/2017 22:37   Ct Angio Chest Pe W/cm &/or Wo Cm  Result Date: 01/04/2017 CLINICAL DATA:  Status post left knee surgery, with diffuse body aches, fever and chills. Headache and neck pain. Initial encounter. EXAM: CT ANGIOGRAPHY CHEST WITH CONTRAST TECHNIQUE: Multidetector CT imaging of the chest was performed using the standard protocol during bolus administration of intravenous contrast. Multiplanar CT image reconstructions and MIPs were obtained to evaluate the vascular anatomy. CONTRAST:  100 mL of Isovue 370 IV contrast COMPARISON:  Chest radiograph performed 01/03/2017, and CT of the chest performed 03/06/2011 FINDINGS: Cardiovascular:  There is no evidence of central pulmonary embolus. The heart remains normal in size. The thoracic aorta is unremarkable. The great vessels within normal limits. Mediastinum/Nodes: No mediastinal lymphadenopathy is seen. No pericardial effusion is identified. The visualized portions of the thyroid gland are unremarkable. No axillary lymphadenopathy is appreciated. Lungs/Pleura: The lungs appear grossly clear. No focal consolidation, pleural effusion or pneumothorax is seen. No masses are identified. Upper Abdomen: There is diffuse fatty infiltration within the liver. The visualized portions of the spleen are unremarkable. Musculoskeletal: No acute osseous abnormalities are identified. Anterior bridging osteophytes are noted along the lower thoracic spine. The visualized musculature is unremarkable in appearance. Review of the MIP images confirms the above findings. IMPRESSION: 1. No evidence of central pulmonary embolus. 2. Lungs clear bilaterally. 3. Diffuse fatty infiltration within the liver. Electronically Signed   By: Garald Balding M.D.   On: 01/04/2017 00:43     EKG: Not done in  ED, will get one.   Assessment/Plan Principal Problem:   Acute respiratory failure with hypoxia (HCC) Active Problems:   Hypertension   HLD (hyperlipidemia)   BPH (benign prostatic hyperplasia)   GERD (gastroesophageal reflux disease)   COPD mixed type (HCC)   Sepsis (HCC)   Fever   Depression   Bronchitis  Fever and acute respiratory failure with hypoxia: Patient has fever, but no leukocytosis, does not meets criteria for sepsis with fever. Lactic acid is normal. No leukocytosis. Currently hemodynamically stable. Etiology is not clear. Potential differential diagnoses include acute bronchitis versus aspiration pneumonia versus COPD exacerbation given productive cough, SOB, oxygen desaturation. Pt reports headache radiating to occipital area, but patient does not have neck rigidity. His neck is very supple on physical examination. I have low suspicion for meningitis. Pt is immunosuppressed since he is on on Mepolizumab injection monthly for asthma. Will need antibiotics recently brought coverage. Pt is high-risk for developing sepsis.  - Will place on telemetry bed for obs. - IV Vancomycin and cefepime, Zosyn Aztreonam.  - Mucinex for cough  - prn Albuterol Nebs, Atrovent nebs and Dulera inhaler and Dulera for SOB - Urine S. pneumococcal antigen - Follow up blood culture x2, sputum culture and respiratory virus panel - will get Procalcitonin and trend lactic acid level per sepsis protocol - IVF: 3L of NS bolus in ED, followed by 125 mL per hour of NS   COPD exacerbation: -see above  Asthma: He is on  Mepolizumab injection monthly. Pt does not have wheezing on auscultation. Does not have exacerbation. -see above -also on Singulair  HTN; -switch Ziac to bisoprolol and hold HCTZ since patient needs IV fluids -IV hydralazine when necessary  BPH and overactive bladder: -continue DDAVP -hold Pentosan and aluzosin  GERD: -Protonix  Depression: Stable, no suicidal or homicidal  ideations. -Continue home medications: Zoloft  HLD: -Pravastatin  S/p of left knee surgery: -prn percocet for pain   DVT ppx: SQ Lovenox Code Status: Full code Family Communication: None at bed side. Disposition Plan:  Anticipate discharge back to previous home environment Consults called:  none Admission status: Obs / tele    Date of Service 01/04/2017    Ivor Costa [facility] Pager [phone]  If 7PM-7AM, please contact night-coverage [email or web address] Password [email or web address] 01/04/2017, 2:28 AM

## 2017-01-04 NOTE — Progress Notes (Signed)
Pharmacy Antibiotic Note  Mark Harrell is a 56 y.o. male s/p recent left knee meniscus repar admitted on 01/03/2017 with possible bronchitis in setting of immunosuppression.  Pharmacy has been consulted for zosyn/vancomycin dosing.  Plan: Zosyn 3.375g IV q8h (4 hour infusion).  Vancomycin 2500 mg IV x1 then 1500 mg IV q12h  Daily Scr F/u cultures/levels as needed  Height: 5\' 10"  (177.8 cm) Weight: (!) 375 lb (170.1 kg) IBW/kg (Calculated) : 73  Temp (24hrs), Avg:102.8 F (39.3 C), Min:102.6 F (39.2 C), Max:103.1 F (39.5 C)   Recent Labs Lab 01/03/17 2204 01/03/17 2247  WBC 9.3  --   CREATININE 0.95  --   LATICACIDVEN  --  1.51    Estimated Creatinine Clearance: 137.3 mL/min (by C-G formula based on SCr of 0.95 mg/dL).    Allergies  Allergen Reactions  . Bee Venom Swelling  . Ppd [Tuberculin Purified Protein Derivative] Other (See Comments)    +ppd NEG Quantferron Gold 3/13  . Fenofibrate Other (See Comments)    Back pain  . Levofloxacin Diarrhea  . Other Other (See Comments)    Some antibiotics cause diarrhea  . Verapamil Other (See Comments)    Back pain  . Claritin [Loratadine] Other (See Comments)  . Cymbalta [Duloxetine Hcl] Other (See Comments)    unknown    Antimicrobials this admission: 8/12 zosyn >>  8/12 vancomycin >>   Dose adjustments this admission:   Microbiology results:  BCx:   UCx:    Sputum:    MRSA PCR:   Thank you for allowing pharmacy to be a part of this patient's care.  Dorrene German 01/04/2017 2:23 AM

## 2017-01-04 NOTE — Progress Notes (Signed)
PT. wanted to eat some crackers before being placed on CPAP, remains on 2lpm n/c, made aware to notify when/if wanting to wear, RT to monitor.

## 2017-01-04 NOTE — Progress Notes (Signed)
Pt. placed on CPAP @ this time, tolerating well.

## 2017-01-04 NOTE — Progress Notes (Signed)
PHARMACIST - PHYSICIAN ORDER COMMUNICATION  CONCERNING: P&T Medication Policy on Herbal Medications  DESCRIPTION:  This patient's order for:  Lutein,  Omega-3 krill oil, cinnamon has been noted.  This product(s) is classified as an "herbal" or natural product. Due to a lack of definitive safety studies or FDA approval, nonstandard manufacturing practices, plus the potential risk of unknown drug-drug interactions while on inpatient medications, the Pharmacy and Therapeutics Committee does not permit the use of "herbal" or natural products of this type within [facility].   ACTION TAKEN: The pharmacy department is unable to verify this order at this time and your patient has been informed of this safety policy. Please reevaluate patient's clinical condition at discharge and address if the herbal or natural product(s) should be resumed at that time.   Thanks Dorrene German 01/04/2017 2:03 AM

## 2017-01-04 NOTE — Progress Notes (Signed)
Please call the inpatient nurse for report at [phone] at [phone].

## 2017-01-05 DIAGNOSIS — J9601 Acute respiratory failure with hypoxia: Secondary | ICD-10-CM | POA: Diagnosis not present

## 2017-01-05 DIAGNOSIS — E785 Hyperlipidemia, unspecified: Secondary | ICD-10-CM | POA: Diagnosis not present

## 2017-01-05 DIAGNOSIS — I10 Essential (primary) hypertension: Secondary | ICD-10-CM | POA: Diagnosis not present

## 2017-01-05 DIAGNOSIS — J449 Chronic obstructive pulmonary disease, unspecified: Secondary | ICD-10-CM | POA: Diagnosis not present

## 2017-01-05 DIAGNOSIS — R509 Fever, unspecified: Secondary | ICD-10-CM

## 2017-01-05 DIAGNOSIS — K219 Gastro-esophageal reflux disease without esophagitis: Secondary | ICD-10-CM

## 2017-01-05 DIAGNOSIS — J690 Pneumonitis due to inhalation of food and vomit: Secondary | ICD-10-CM | POA: Diagnosis not present

## 2017-01-05 MED ORDER — AMOXICILLIN-POT CLAVULANATE 875-125 MG PO TABS: 1.0000 | ORAL_TABLET | Freq: Two times a day (BID) | ORAL | 0 refills | Status: DC

## 2017-01-05 NOTE — Op Note (Signed)
NAME:  Mark Harrell, Mark Harrell                       ACCOUNT NO.:  MEDICAL RECORD NO.:  [id number]  LOCATION:                                 FACILITY:  PHYSICIAN:  Lind Guest. [name], M.D.DATE OF BIRTH:  DATE OF PROCEDURE:  01/02/2017 DATE OF DISCHARGE:                              OPERATIVE REPORT   PREOPERATIVE DIAGNOSIS:  Left knee medial meniscal tear.  POSTOPERATIVE DIAGNOSIS:  Left knee medial meniscal tear.  PROCEDURE:  Left knee arthroscopy with partial medial meniscectomy.  SURGEON:  Lind Guest. [name], M.D.  ASSISTANT:  Erskine Emery, PA-C.  ANESTHESIA: 1. Mask ventilation and IV sedation. 2. Local with first 1% plain lidocaine followed by 0.5% plain Marcaine     and 4 mg of morphine.  COMPLICATIONS:  None.  BLOOD LOSS:  Minimal.  INDICATIONS:  Mr. Golubski is a 56 year old gentleman with multiple medical comorbidities and problems as well as morbid obesity.  He is dealt with acute-on-chronic meniscal tear of his left knee.  We had planned to perform an arthroscopic intervention last year, but he got too sick.  He has since stabilized all his medical problems and at this point, is still having mechanical symptoms of locking and catching pain and recurrent effusions of his left knee.  He wishes to have an arthroscopic intervention at this point.  We talked to him about the risk of nerve and vessel injury as well as increasing his arthritic risk of his knee. We had talked about the risk of the DVT as well.  He also does not want to have any general anesthesia due to past problems.  We have elected to try a spinal anesthesia and if that does not work, which he did not, a mask ventilation with IV sedation and local.  PROCEDURE DESCRIPTION:  After informed consent was obtained, appropriate left knee was marked.  He was brought to the operating room and sat up on the operating table.  Spinal anesthesia was attempted, but it was difficult to obtain.  We laid him in a  supine position and then prepped his leg from the thigh down the ankle with DuraPrep and sterile drapes including a sterile stockinette.  With the bed raised and a lateral leg post utilized, a time-out was called and he was identified as correct patient and correct left knee.  He was given some light IV sedation and mask ventilation and then I first past 1% plain lidocaine in the medial and lateral aspects of the knee where the arthroscopy portals are being, I placed it superficially through the skin and deep.  I then placed the mixture of 0.25% Marcaine and morphine.  I was able to make an incision without any difficulty.  We placed a cannula in the knee and drained a large effusion from the knee.  We went to the medial compartment of his knee and made an anteromedial incision.  Fortunately, we did not find any significant cartilage damage in his knee, but we did find a midbody medial meniscal tear and I was able to use straight biters and arthroscopic shaver to perform a partial medial meniscectomy.  He had grade 2 chondromalacia of  the medial femoral condyle and the medial tibial plateau.  The ACL and PCL were intact.  Then with the knee in a figure 4 position, the lateral compartment was intact.  Finally, the patellofemoral joint showed only grade 2 to grade 3 chondromalacia of the patella.  All this was debrided back to a stable margin.  We then allowed fluid lavage from the knee and then drained all fluid from the knee.  We closed the portal sites with interrupted nylon suture and placed the rest of the Marcaine and morphine into his knee.  Xeroform and well-padded sterile dressing were applied, and he was taken to the recovery room in stable condition.  All final counts were correct. There were no complications noted.     Lind Guest. [name], M.D.     CYB/MEDQ  D:  01/02/2017  T:  01/02/2017  Job:  [id number]

## 2017-01-05 NOTE — Progress Notes (Signed)
Pt. ready to be placed on CPAP for h/s, 2 lpm Oxygen placed to circuit, RN aware , tolerating well.

## 2017-01-05 NOTE — Discharge Summary (Signed)
Physician Discharge Summary  Mark Harrell [facility]:[id number] DOB: 11/16/1960 DOA: 01/03/2017  PCP: Mark Pinto, MD  Admit date: 01/03/2017 Discharge date: 01/05/2017  Admitted From: Home Disposition: Home   Recommendations for Outpatient Follow-up:  1. Follow up with PCP 8/16. Discharged on augmentin to complete 7 days therapy for aspiration event. 2. Follow up with orthopedics as scheduled  Home Health: None Equipment/Devices: None Discharge Condition: Stable CODE STATUS: Full Diet recommendation: Heart healthy  Brief/Interim Summary: Mark Harrell is a 56 y.o. male with a history of HTN, HLD, asthma on mepolizumab, morbid obesity, and OSA who presented to the ED 8/11 with fever and cough after awakening choking on vomit with CPAP. The previous day he underwent arthroscopic left knee meniscus repair under conscious sedation with spinal block and notes no complications. On arrival he was febrile to 103.51F and hypoxic, 89% on room air. Work up, including CXR and CTA chest has revealed no evidence of infection or PE. Surgical sites appear normal. No leukocytosis, lactic acid normal, procalcitonin is undetectable. Broad spectrum antibiotics started, cultures drawn, and he was admitted. Repiratory status improved and oxygen was weaned. Antibiotics were narrowed to cover for aspiration pneumonia. He does not qualify for home oxygen, and is stable for discharge.  Discharge Diagnoses:  Principal Problem:   Acute respiratory failure with hypoxia (HCC) Active Problems:   Hypertension   HLD (hyperlipidemia)   BPH (benign prostatic hyperplasia)   GERD (gastroesophageal reflux disease)   COPD mixed type (HCC)   Sepsis (HCC)   Fever   Depression   Bronchitis  This is a morbidly obese 56yo male with baseline pulmonary impairment (asthma and OSA) on immunosuppression (mepolizumab) admitted for fever and acute hypoxia following what must have been an aspiration event (woke up vomiting with CPAP on,  states he inhaled emesis).   Aspiration pneumonitis/pneumonia: Infiltrate would not be expected this early following event, though he is at high risk for aspiration pneumonia. There is no wheezing to suggest asthma exacerbation. Has remained hemodynamically stable. Had neck pain but this is resolving and no meningitic signs on exam. - Transition zosyn > augmentin x7 total days - Monitor blood cultures (NGTD at discharge) - Continue home medications for chronic conditions including CPAP qHS  Discharge Instructions Discharge Instructions    Discharge instructions    Complete by:  As directed    You were admitted with aspiration pneumonitis, and will need to continue antibiotics for early aspiration pneumonia.  - Follow up with your PCP as scheduled and take augmentin twice daily for the next 5 days.  - If you develop fever, worsening trouble breathing, or chest pain seek medical attention right away.     Allergies as of 01/05/2017      Reactions   Bee Venom Swelling   Ppd [tuberculin Purified Protein Derivative] Other (See Comments)   +ppd NEG Quantferron Gold 3/13   Fenofibrate Other (See Comments)   Back pain   Levofloxacin Diarrhea   Other Other (See Comments)   Some antibiotics cause diarrhea   Verapamil Other (See Comments)   Back pain   Claritin [loratadine] Other (See Comments)   Cymbalta [duloxetine Hcl] Other (See Comments)   unknown      Medication List    TAKE these medications   albuterol 108 (90 Base) MCG/ACT inhaler Commonly known as:  PROAIR HFA INHALE 2 PUFFS INTO THE LUNGS EVERY 4 (FOUR) HOURS AS NEEDED FOR WHEEZING OR SHORTNESS OF BREATH.   albuterol (2.5 MG/3ML) 0.083% nebulizer solution  Commonly known as:  PROVENTIL Inhale 2.5 mg into the lungs every 4 (four) hours as needed.   alfuzosin 10 MG 24 hr tablet Commonly known as:  UROXATRAL Take 10 mg by mouth daily.   amoxicillin-clavulanate 875-125 MG tablet Commonly known as:  AUGMENTIN Take 1 tablet  by mouth 2 (two) times daily.   baclofen 10 MG tablet Commonly known as:  LIORESAL Take 10 mg by mouth 3 (three) times daily as needed for muscle spasms.   bisoprolol-hydrochlorothiazide 5-6.25 MG tablet Commonly known as:  ZIAC Take 1 tablet by mouth daily.   budesonide-formoterol 160-4.5 MCG/ACT inhaler Commonly known as:  SYMBICORT Inhale 2 puffs into the lungs 2 (two) times daily.   CALCIUM + D PO Take 500 mg by mouth 2 (two) times daily.   CEREFOLIN NAC 6-90.314-2-600 MG Tabs Take 1 tablet by mouth daily.   cetaphil lotion Apply topically 2 (two) times daily.   cetirizine 10 MG tablet Commonly known as:  ZYRTEC Take 10 mg by mouth daily.   Cinnamon 500 MG capsule Take 500 mg by mouth 2 (two) times daily.   cyclobenzaprine 10 MG tablet Commonly known as:  FLEXERIL Take 10 mg by mouth 3 (three) times daily as needed for muscle spasms.   desmopressin 0.2 MG tablet Commonly known as:  DDAVP Take 200 mcg by mouth at bedtime.   donepezil 23 MG Tabs tablet Commonly known as:  ARICEPT Take 23 mg by mouth at bedtime.   DYMISTA 137-50 MCG/ACT Susp Generic drug:  Azelastine-Fluticasone PLACE 2 SPRAYS INTO BOTH NOSTRILS 2 (TWO) TIMES DAILY.   esomeprazole 20 MG capsule Commonly known as:  NEXIUM Take 20 mg by mouth daily.   gabapentin 600 MG tablet Commonly known as:  NEURONTIN 1,800 mg 3 (three) times daily.   guaiFENesin 600 MG 12 hr tablet Commonly known as:  MUCINEX Take 600 mg by mouth 2 (two) times daily as needed for cough or to loosen phlegm.   LUTEIN PO Take 1 tablet by mouth 2 (two) times daily.   montelukast 10 MG tablet Commonly known as:  SINGULAIR TAKE 1 TABLET (10 MG TOTAL) BY MOUTH DAILY.   multivitamin with minerals tablet Take 1 tablet by mouth daily.   NAMENDA 10 MG tablet Generic drug:  memantine Take 10 mg by mouth 2 (two) times daily.   NUCALA 100 MG Solr Generic drug:  Mepolizumab Pt receives injection once a month   Omega-3  Krill Oil 1000 MG Caps Take 1,000 mg by mouth 2 (two) times daily.   ondansetron 4 MG disintegrating tablet Commonly known as:  ZOFRAN ODT Take 1 tablet (4 mg total) by mouth every 6 (six) hours as needed for nausea or vomiting.   oxyCODONE-acetaminophen 10-325 MG tablet Commonly known as:  PERCOCET Take 1 tablet by mouth every 4 (four) hours as needed for pain.   oxymetazoline 0.05 % nasal spray Commonly known as:  AFRIN Place 1 spray into both nostrils See admin instructions. 1 SPRAY PER SIDE EACH NIGHT BEFORE DYMISTA   pentosan polysulfate 100 MG capsule Commonly known as:  ELMIRON Take 200 mg by mouth 3 (three) times daily before meals.   Potassium 99 MG Tabs Take 1 tablet by mouth daily.   pravastatin 40 MG tablet Commonly known as:  PRAVACHOL TAKE 1 TABLET BY MOUTH AT BEDTIME   PRESCRIPTION MEDICATION Pt receives weekly allergy shots   sertraline 50 MG tablet Commonly known as:  ZOLOFT Take 50 mg by mouth 2 (two) times daily.  sodium chloride 0.65 % Soln nasal spray Commonly known as:  OCEAN Place 1 spray into both nostrils 4 (four) times daily as needed for congestion. Uses each time before other nasal sprays   tadalafil 5 MG tablet Commonly known as:  CIALIS Take 5 mg by mouth every evening.   triamcinolone cream 0.1 % Commonly known as:  KENALOG APPLY TOPICALLY 4 TIMES A DAY What changed:  See the new instructions.   trospium 20 MG tablet Commonly known as:  SANCTURA Take 20 mg by mouth 2 (two) times daily.   Turmeric Curcumin Caps Take 1 capsule by mouth 2 (two) times daily.   VAYACOG PO Take 1 capsule by mouth 2 (two) times daily.   VIBERZI 100 MG Tabs Generic drug:  Eluxadoline Take 1 tablet by mouth 2 (two) times daily.   VITAMIN D PO Take 10,000-20,000 Units by mouth 2 (two) times daily. 20000 in the morning and 10000 in the evening   ZENPEP 25000-79000 units Cpep Generic drug:  Pancrelipase (Lip-Prot-Amyl) Take 2 capsules by mouth 3  (three) times daily.      Follow-up Information    Mark Pinto, MD Follow up on 01/08/2017.   Specialty:  Internal Medicine Contact information: [address] [place] [address] [phone]          Allergies  Allergen Reactions  . Bee Venom Swelling  . Ppd [Tuberculin Purified Protein Derivative] Other (See Comments)    +ppd NEG Quantferron Gold 3/13  . Fenofibrate Other (See Comments)    Back pain  . Levofloxacin Diarrhea  . Other Other (See Comments)    Some antibiotics cause diarrhea  . Verapamil Other (See Comments)    Back pain  . Claritin [Loratadine] Other (See Comments)  . Cymbalta [Duloxetine Hcl] Other (See Comments)    unknown    Consultations:  None  Procedures/Studies: Dg Chest 2 View  Result Date: 01/03/2017 CLINICAL DATA:  56 y/o M; left knee surgery yesterday presenting with body ache, fever, chills, and headache. EXAM: CHEST  2 VIEW COMPARISON:  07/16/2016 chest radiograph FINDINGS: Stable cardiomegaly. Clear lungs. No pleural effusion or pneumothorax. Mild degenerative changes of the thoracic spine. No acute osseous abnormality is evident. IMPRESSION: No acute pulmonary process identified.  Stable cardiomegaly. Electronically Signed   By: Kristine Garbe M.D.   On: 01/03/2017 22:37   Ct Angio Chest Pe W/cm &/or Wo Cm  Result Date: 01/04/2017 CLINICAL DATA:  Status post left knee surgery, with diffuse body aches, fever and chills. Headache and neck pain. Initial encounter. EXAM: CT ANGIOGRAPHY CHEST WITH CONTRAST TECHNIQUE: Multidetector CT imaging of the chest was performed using the standard protocol during bolus administration of intravenous contrast. Multiplanar CT image reconstructions and MIPs were obtained to evaluate the vascular anatomy. CONTRAST:  100 mL of Isovue 370 IV contrast COMPARISON:  Chest radiograph performed 01/03/2017, and CT of the chest performed 03/06/2011 FINDINGS: Cardiovascular:  There is no  evidence of central pulmonary embolus. The heart remains normal in size. The thoracic aorta is unremarkable. The great vessels within normal limits. Mediastinum/Nodes: No mediastinal lymphadenopathy is seen. No pericardial effusion is identified. The visualized portions of the thyroid gland are unremarkable. No axillary lymphadenopathy is appreciated. Lungs/Pleura: The lungs appear grossly clear. No focal consolidation, pleural effusion or pneumothorax is seen. No masses are identified. Upper Abdomen: There is diffuse fatty infiltration within the liver. The visualized portions of the spleen are unremarkable. Musculoskeletal: No acute osseous abnormalities are identified. Anterior bridging osteophytes are  noted along the lower thoracic spine. The visualized musculature is unremarkable in appearance. Review of the MIP images confirms the above findings. IMPRESSION: 1. No evidence of central pulmonary embolus. 2. Lungs clear bilaterally. 3. Diffuse fatty infiltration within the liver. Electronically Signed   By: Garald Balding M.D.   On: 01/04/2017 00:43   Subjective: Pt feels better today, dyspnea is improving, still notes this is worse with exertion. He's had no fevers since admission. No wheezing or chest pain.   Discharge Exam: BP 130/73 (BP Location: Left Arm)   Pulse 65   Temp 98.7 F (37.1 C) (Oral)   Resp 20   Ht 6' (1.829 m)   Wt (!) 177.5 kg (391 lb 5.1 oz)   SpO2 95%   BMI 53.07 kg/m   General: Pleasant, obese male in no distress Cardiovascular: RRR, no murmur, JVD, or edema Respiratory: Nonlabored, distant without crackles or wheezing.  Labs: BNP (last 3 results)  Recent Labs  02/13/16 1204  BNP 03.5   Basic Metabolic Panel:  Recent Labs Lab 01/03/17 2204  NA 140  K 4.0  CL 100*  CO2 33*  GLUCOSE 92  BUN 17  CREATININE 0.95  CALCIUM 9.0   Liver Function Tests:  Recent Labs Lab 01/03/17 2204  AST 21  ALT 23  ALKPHOS 60  BILITOT 1.2  PROT 6.3*  ALBUMIN 3.4*    CBC:  Recent Labs Lab 01/03/17 2204  WBC 9.3  NEUTROABS 5.6  HGB 13.6  HCT 42.9  MCV 91.1  PLT 269   Urinalysis    Component Value Date/Time   COLORURINE STRAW (A) 01/03/2017 2204   APPEARANCEUR CLEAR 01/03/2017 2204   LABSPEC 1.014 01/03/2017 2204   PHURINE 5.0 01/03/2017 [place] 01/03/2017 2204   HGBUR NEGATIVE 01/03/2017 2204   [place] 01/03/2017 [place] 01/03/2017 2204   PROTEINUR NEGATIVE 01/03/2017 2204   NITRITE NEGATIVE 01/03/2017 2204   LEUKOCYTESUR NEGATIVE 01/03/2017 2204    Microbiology Recent Results (from the past 240 hour(s))  Culture, blood (Routine x 2)     Status: None (Preliminary result)   Collection Time: 01/03/17 10:09 PM  Result Value Ref Range Status   Specimen Description   Final    BLOOD BLOOD LEFT FOREARM Performed at [facility], [place] [address] [place], [place] [address]    Special Requests   Final    BOTTLES DRAWN AEROBIC AND ANAEROBIC Blood Culture adequate volume   Culture PENDING  Incomplete   Report Status PENDING  Incomplete  Respiratory Panel by PCR     Status: None   Collection Time: 01/04/17  1:45 AM  Result Value Ref Range Status   Adenovirus NOT DETECTED NOT DETECTED Final   Coronavirus 229E NOT DETECTED NOT DETECTED Final   Coronavirus HKU1 NOT DETECTED NOT DETECTED Final   Coronavirus NL63 NOT DETECTED NOT DETECTED Final   Coronavirus OC43 NOT DETECTED NOT DETECTED Final   Metapneumovirus NOT DETECTED NOT DETECTED Final   Rhinovirus / Enterovirus NOT DETECTED NOT DETECTED Final   Influenza A NOT DETECTED NOT DETECTED Final   Influenza A H1 NOT DETECTED NOT DETECTED Final   Influenza A H1 2009 NOT DETECTED NOT DETECTED Final   Influenza A H3 NOT DETECTED NOT DETECTED Final   Influenza B NOT DETECTED NOT DETECTED Final   Parainfluenza Virus 1 NOT DETECTED NOT DETECTED Final   Parainfluenza Virus 2 NOT DETECTED NOT DETECTED Final   Parainfluenza Virus 3 NOT  DETECTED NOT DETECTED Final   Parainfluenza Virus 4 NOT DETECTED NOT DETECTED Final   Respiratory Syncytial Virus NOT DETECTED NOT DETECTED Final   Bordetella pertussis NOT DETECTED NOT DETECTED Final   Chlamydophila pneumoniae NOT DETECTED NOT DETECTED Final   Mycoplasma pneumoniae NOT DETECTED NOT DETECTED Final    Time coordinating discharge: Approximately 40 minutes  [name], MD  [facility] 01/05/2017, 2:28 PM Pager [phone]

## 2017-01-05 NOTE — Progress Notes (Signed)
SATURATION QUALIFICATIONS: (This note is used to comply with regulatory documentation for home oxygen)  Patient Saturations on Room Air at Rest = 95%  Patient Saturations on Room Air while Ambulating = 92%    Please briefly explain why patient needs home oxygen:  Did not ambulate patient with oxygen since he maintained at 92% on room air.

## 2017-01-07 NOTE — Progress Notes (Signed)
Patient ID: Mark Harrell, male   DOB: July 18, 1960, 56 y.o.   MRN: [id number]  Assessment and Plan:  Hypertension:  -Continue medication,  -monitor blood pressure at home.  -Continue DASH diet.   -Reminder to go to the ER if any CP, SOB, nausea, dizziness, severe HA, changes vision/speech, left arm numbness and tingling, and jaw pain.  Cholesterol: -Continue diet and exercise.  -Check cholesterol.   Vitamin D Def: -check level -continue medications.   Joint pain Voltaren gel  COPD mixed type ([place]) Follow up pulmonary Weight loss advised  Get back on CPAP  Obesity, Class III, BMI 40-49.9 (morbid obesity) ([place]) [facility] refer to [facility] per patient request Encouraged weight loss to increase quality of life and decrease meds and decrease morbidity/mortality risk, patient agrees.   Polypharmacy Discussed need to decrease medications - don't take pain pills due to recent aspiration pneumonitiis  Dyspnea Likely multifactorial with COPD and obesity Patient is requesting cardio Does have sleep apnea, swelling, SOB, can refer for echo.  Weight loss advised/stressed  Continue diet and meds as discussed. Further disposition pending results of labs. Future Appointments Date Time Provider [place]  01/14/2017 1:45 PM Mcarthur Rossetti, MD [facility] None  01/15/2017 3:00 PM [facility] NURSE [name]  01/22/2017 3:00 PM [name], MD [name]  02/10/2017 2:15 PM Crawford, Bennie Pierini T, DPM [name]  04/14/2017 3:30 PM [name], MD [name]  11/05/2017 3:00 PM [name], MD [name]    HPI 56 y.o. male  presents for 3 month follow up with hypertension, hyperlipidemia, prediabetes and vitamin D.   His blood pressure has been controlled at home, today their BP is BP: 130/82.   He does workout. He denies chest pain, shortness of breath, dizziness.  He does not do much activity Has been having repeated infections and may start  on IVIG therapy. He has SOB with any activity, SOB/cough, continues to have fatigue, mentally foggy, NOT ON CPAP, and has polypharmacy.  He was in the hospital from 08/11-08/13, for aspiration after left knee arthroscopy, no PE or infection on CXR/CTA. Did not qualify for home oxygen, is still on augmentin. Has not taken anymore pain pills due to fear of aspiration. Has not used CPAP since hospital.    He is on cholesterol medication and denies myalgias. His cholesterol is at goal. The cholesterol last visit was:   Lab Results  Component Value Date   CHOL 128 10/02/2016   HDL 31 (L) 10/02/2016   LDLCALC 52 10/02/2016   TRIG 223 (H) 10/02/2016   CHOLHDL 4.1 10/02/2016    He has been working on diet and exercise for prediabetes, and denies foot ulcerations, hyperglycemia, hypoglycemia , increased appetite, nausea, paresthesia of the feet, polydipsia, polyuria, visual disturbances, vomiting and weight loss. Last A1C in the office was:  Lab Results  Component Value Date   HGBA1C 5.5 10/02/2016   Patient is on Vitamin D supplement.  Lab Results  Component Value Date   VD25OH 82 10/02/2016     BMI is Body mass index is 51.02 kg/m., he is working on diet and exercise. Wt Readings from Last 3 Encounters:  01/08/17 (!) 376 lb 3.2 oz (170.6 kg)  01/04/17 (!) 391 lb 5.1 oz (177.5 kg)  01/02/17 (!) 375 lb (170.1 kg)    Current Medications:  Current Outpatient Prescriptions on File Prior to Visit  Medication Sig Dispense Refill  . albuterol (PROAIR HFA) 108 (90 Base) MCG/ACT inhaler INHALE  2 PUFFS INTO THE LUNGS EVERY 4 (FOUR) HOURS AS NEEDED FOR WHEEZING OR SHORTNESS OF BREATH. 8.5 each 3  . albuterol (PROVENTIL) (2.5 MG/3ML) 0.083% nebulizer solution Inhale 2.5 mg into the lungs every 4 (four) hours as needed.  12  . alfuzosin (UROXATRAL) 10 MG 24 hr tablet Take 10 mg by mouth daily.      [name] amoxicillin-clavulanate (AUGMENTIN) 875-125 MG tablet Take 1 tablet by mouth 2 (two) times daily. 10  tablet 0  . baclofen (LIORESAL) 10 MG tablet Take 10 mg by mouth 3 (three) times daily as needed for muscle spasms.     . bisoprolol-hydrochlorothiazide (ZIAC) 5-6.25 MG tablet Take 1 tablet by mouth daily. 90 tablet 0  . budesonide-formoterol (SYMBICORT) 160-4.5 MCG/ACT inhaler Inhale 2 puffs into the lungs 2 (two) times daily. 1 Inhaler 5  . Calcium Carbonate-Vitamin D (CALCIUM + D PO) Take 500 mg by mouth 2 (two) times daily.     . cetaphil (CETAPHIL) lotion Apply topically 2 (two) times daily. 236 mL 0  . cetirizine (ZYRTEC) 10 MG tablet Take 10 mg by mouth daily.    . Cholecalciferol (VITAMIN D PO) Take 10,000-20,000 Units by mouth 2 (two) times daily. 20000 in the morning and 10000 in the evening    . Cinnamon 500 MG capsule Take 500 mg by mouth 2 (two) times daily.     . cyclobenzaprine (FLEXERIL) 10 MG tablet Take 10 mg by mouth 3 (three) times daily as needed for muscle spasms.    [name] desmopressin (DDAVP) 0.2 MG tablet Take 200 mcg by mouth at bedtime.  3  . donepezil (ARICEPT) 23 MG TABS tablet Take 23 mg by mouth at bedtime.    . DYMISTA 137-50 MCG/ACT SUSP PLACE 2 SPRAYS INTO BOTH NOSTRILS 2 (TWO) TIMES DAILY.  5  . Eluxadoline (VIBERZI) 100 MG TABS Take 1 tablet by mouth 2 (two) times daily.     [name] esomeprazole (NEXIUM) 20 MG capsule Take 20 mg by mouth daily.  1  . gabapentin (NEURONTIN) 600 MG tablet 1,800 mg 3 (three) times daily.     [name] guaiFENesin (MUCINEX) 600 MG 12 hr tablet Take 600 mg by mouth 2 (two) times daily as needed for cough or to loosen phlegm.     . LUTEIN PO Take 1 tablet by mouth 2 (two) times daily.    . memantine (NAMENDA) 10 MG tablet Take 10 mg by mouth 2 (two) times daily.     . Mepolizumab (NUCALA) 100 MG SOLR Pt receives injection once a month    . Methylfol-Algae-B12-Acetylcyst (CEREFOLIN NAC) 6-90.314-2-600 MG TABS Take 1 tablet by mouth daily.     . Misc Natural Products (TURMERIC CURCUMIN) CAPS Take 1 capsule by mouth 2 (two) times daily.    .  montelukast (SINGULAIR) 10 MG tablet TAKE 1 TABLET (10 MG TOTAL) BY MOUTH DAILY. 90 tablet 1  . Multiple Vitamins-Minerals (MULTIVITAMIN WITH MINERALS) tablet Take 1 tablet by mouth daily.    . Omega-3 Krill Oil 1000 MG CAPS Take 1,000 mg by mouth 2 (two) times daily.    . ondansetron (ZOFRAN ODT) 4 MG disintegrating tablet Take 1 tablet (4 mg total) by mouth every 6 (six) hours as needed for nausea or vomiting. 30 tablet 0  . oxymetazoline (AFRIN) 0.05 % nasal spray Place 1 spray into both nostrils See admin instructions. 1 SPRAY PER SIDE EACH NIGHT BEFORE DYMISTA    . pentosan polysulfate (ELMIRON) 100 MG capsule Take 200 mg by mouth 3 (  three) times daily before meals.     . Phosphatidylserine-DHA-EPA (VAYACOG PO) Take 1 capsule by mouth 2 (two) times daily.    . Potassium 99 MG TABS Take 1 tablet by mouth daily.    . pravastatin (PRAVACHOL) 40 MG tablet TAKE 1 TABLET BY MOUTH AT BEDTIME 90 tablet 2  . PRESCRIPTION MEDICATION Pt receives weekly allergy shots    . sertraline (ZOLOFT) 50 MG tablet Take 50 mg by mouth 2 (two) times daily.     . sodium chloride (OCEAN) 0.65 % SOLN nasal spray Place 1 spray into both nostrils 4 (four) times daily as needed for congestion. Uses each time before other nasal sprays    . tadalafil (CIALIS) 5 MG tablet Take 5 mg by mouth every evening.     . triamcinolone cream (KENALOG) 0.1 % APPLY TOPICALLY 4 TIMES A DAY (Patient taking differently: APPLY TOPICALLY 2 TIMES A DAY AS NEEDED) 30 g 1  . trospium (SANCTURA) 20 MG tablet Take 20 mg by mouth 2 (two) times daily.   2  . ZENPEP 25000-79000 units CPEP Take 2 capsules by mouth 3 (three) times daily.  0  . oxyCODONE-acetaminophen (PERCOCET) 10-325 MG tablet Take 1 tablet by mouth every 4 (four) hours as needed for pain. (Patient not taking: Reported on 01/08/2017) 60 tablet 0   Current Facility-Administered Medications on File Prior to Visit  Medication Dose Route Frequency Provider Last Rate Last Dose  .  Mepolizumab SOLR 100 mg  100 mg Subcutaneous Q28 days [name], MD   100 mg at 12/18/16 1439    Medical History:  Past Medical History:  Diagnosis Date  . Anesthesia complication requiring reversal agent administration    ? from central apnea, very difficult to get off vent  . Anxiety   . Arthritis    osteo  . Asthma   . BPH (benign prostatic hyperplasia)   . Complication of anesthesia    difficulty waking , they twlight me because of my respiratory problems "  . Dyspnea   . Enlarged heart   . Family history of adverse reaction to anesthesia    mother trouble waking up, and heart stopped  . GERD (gastroesophageal reflux disease)   . Headache    botox injections for headaches  . Hyperlipidemia   . Hypertension   . Hypogonadism male   . IBS (irritable bowel syndrome)   . Obesity   . OSA (obstructive sleep apnea)    cpap  . Pneumonia   . Pre-diabetes   . Prostatitis     Allergies:  Allergies  Allergen Reactions  . Bee Venom Swelling  . Ppd [Tuberculin Purified Protein Derivative] Other (See Comments)    +ppd NEG Quantferron Gold 3/13  . Fenofibrate Other (See Comments)    Back pain  . Levofloxacin Diarrhea  . Other Other (See Comments)    Some antibiotics cause diarrhea  . Verapamil Other (See Comments)    Back pain  . Claritin [Loratadine] Other (See Comments)  . Cymbalta [Duloxetine Hcl] Other (See Comments)    unknown     Review of Systems:  Review of Systems  Constitutional: Positive for malaise/fatigue. Negative for chills, diaphoresis, fever and weight loss.  HENT: Negative for congestion, ear pain and sore throat.   Eyes: Negative.   Respiratory: Positive for shortness of breath. Negative for cough and wheezing.   Cardiovascular: Negative for chest pain, palpitations and leg swelling.  Gastrointestinal: Negative for abdominal pain, blood in stool, constipation, diarrhea,  heartburn, melena, nausea and vomiting.  Genitourinary: Positive for  frequency. Negative for dysuria, flank pain, hematuria and urgency.  Musculoskeletal: Positive for back pain.  Skin: Negative.  Negative for itching and rash.  Neurological: Negative for dizziness, seizures, loss of consciousness, weakness and headaches.  Psychiatric/Behavioral: Positive for memory loss. Negative for depression. The patient is not nervous/anxious and does not have insomnia.     Family history- Review and unchanged  Social history- Review and unchanged  Physical Exam: BP 130/82   Pulse 71   Temp 97.7 F (36.5 C)   Resp 18   Ht 6' (1.829 m)   Wt (!) 376 lb 3.2 oz (170.6 kg)   SpO2 93%   BMI 51.02 kg/m  Wt Readings from Last 3 Encounters:  01/08/17 (!) 376 lb 3.2 oz (170.6 kg)  01/04/17 (!) 391 lb 5.1 oz (177.5 kg)  01/02/17 (!) 375 lb (170.1 kg)    General Appearance: Well nourished well developed, in no apparent distress. Eyes: PERRLA, EOMs, conjunctiva no swelling or erythema ENT/Mouth: Ear canals normal without obstruction, swelling, erythma, discharge.  TMs normal bilaterally.  Oropharynx moist, clear, without exudate, or postoropharyngeal swelling. Neck: Supple, thyroid normal,no cervical adenopathy  Respiratory: Respiratory effort normal, Breath sounds clear A&P, decreased breath sounds without rhonchi, wheeze, or rale.  No retractions, no accessory usage. Cardio: RRR with no MRGs. Brisk peripheral pulses with mild bilateral edema.  Abdomen: Soft, + BS, morbidly obese  Non tender, no guarding, rebound, hernias, masses. Musculoskeletal: Full ROM, 5/5 strength, antalgic gait Skin: Warm, dry without rashes, lesions, ecchymosis.  Neuro: Awake and oriented X 3, Cranial nerves intact. Normal muscle tone, no cerebellar symptoms. Psych: Normal affect, Insight and Judgment appropriate.    Vicie Mutters, PA-C 3:48 PM [facility]

## 2017-01-08 ENCOUNTER — Encounter: Payer: Self-pay | Admitting: Physician Assistant

## 2017-01-08 ENCOUNTER — Ambulatory Visit (INDEPENDENT_AMBULATORY_CARE_PROVIDER_SITE_OTHER): Payer: BLUE CROSS/BLUE SHIELD | Admitting: Physician Assistant

## 2017-01-08 VITALS — BP 130/82 | HR 71 | Temp 97.7°F | Resp 18 | Ht 72.0 in | Wt 376.2 lb

## 2017-01-08 DIAGNOSIS — G40219 Localization-related (focal) (partial) symptomatic epilepsy and epileptic syndromes with complex partial seizures, intractable, without status epilepticus: Secondary | ICD-10-CM | POA: Diagnosis not present

## 2017-01-08 DIAGNOSIS — J449 Chronic obstructive pulmonary disease, unspecified: Secondary | ICD-10-CM | POA: Diagnosis not present

## 2017-01-08 DIAGNOSIS — E119 Type 2 diabetes mellitus without complications: Secondary | ICD-10-CM

## 2017-01-08 DIAGNOSIS — Z6841 Body Mass Index (BMI) 40.0 and over, adult: Secondary | ICD-10-CM

## 2017-01-08 DIAGNOSIS — Z79899 Other long term (current) drug therapy: Secondary | ICD-10-CM | POA: Diagnosis not present

## 2017-01-08 DIAGNOSIS — R0600 Dyspnea, unspecified: Secondary | ICD-10-CM

## 2017-01-08 DIAGNOSIS — G4733 Obstructive sleep apnea (adult) (pediatric): Secondary | ICD-10-CM | POA: Diagnosis not present

## 2017-01-08 DIAGNOSIS — I10 Essential (primary) hypertension: Secondary | ICD-10-CM

## 2017-01-08 DIAGNOSIS — E785 Hyperlipidemia, unspecified: Secondary | ICD-10-CM

## 2017-01-08 MED ORDER — DICLOFENAC SODIUM 1 % TD GEL: 4.0000 g | Freq: Four times a day (QID) | TRANSDERMAL | 3 refills | Status: DC

## 2017-01-08 NOTE — Patient Instructions (Addendum)
Suggest NOT taking pain pills Get back on CPAP  Will refer for bariatric evaluation/weight loss [facility]  Eat 3 meals a day, have to do breakfast, eat protein- hard boiled eggs, protein bar like nature valley protein bar, greek yogurt like oikos triple zero, chobani 100, or light n fit greek  Pneumonitis Pneumonitis is inflammation of the lungs. Infection or exposure to certain substances or allergens can cause this condition. Allergens are substances that you are allergic to. What are the causes? This condition may be caused by:  An infection from bacteria or a virus (pneumonia).  Exposure to certain substances in the workplace. This includes working on farms and in certain industries. Some substances that can cause this condition include asbestos, silica, inhaled acids, or inhaled chlorine gas.  Repeated exposure to bird feathers, bird feces, or other allergens.  Medicines such as chemotherapy drugs, certain antibiotic medicines, and some heart medicines.  Radiation therapy.  Exposure to mold. A hot tub, sauna, or home humidifier can have mold growing in it, even if it looks clean. You can breathe in the mold through water vapor.  Breathing in (aspirating) stomach contents, food, or liquids into the lungs.  What are the signs or symptoms? Symptoms of this condition include:  Shortness of breath or trouble breathing. This is the most common symptom.  Cough.  Fever.  Decreased energy.  Decreased appetite.  How is this diagnosed? This condition may be diagnosed based on:  Your medical history.  Physical exam.  Blood tests.  Other tests, including: ? Pulmonary function test (PFT). This measures how well your lungs work. ? Chest X-ray. ? CT scan of the lungs. ? Bronchoscopy. In this procedure, your health care provider looks at your airways through an instrument called a bronchoscope. ? Lung biopsy. In this procedure, your health care provider takes a small piece of  tissue from your lungs to examine it.  How is this treated? Treatment depends on the cause of the condition. If the cause is exposure to a substance, avoiding further exposure to that substance will help reduce your symptoms. Possible medical treatments for pneumonitis include:  Corticosteroid medicine to help decrease inflammation.  Antibiotic medicine to help fight an infection caused by bacteria.  Bronchodilators or inhalers to help relax the muscles and make breathing easier.  Oxygen therapy, if you are having trouble breathing.  Follow these instructions at home:  Take or use over-the-counter and prescription medicines only as told by your health care provider. This includes any inhaler use.  Avoid exposure to any substance that caused your pneumonitis. If you need to work with substances that can cause pneumonitis, wear a mask to protect your lungs.  If you were prescribed an antibiotic, take it as told by your health care provider. Do not stop taking the antibiotic even if you start to feel better.  If you were prescribed an inhaler, keep it with you at all times.  Do not use any products that contain nicotine or tobacco, such as cigarettes and e-cigarettes. If you need help quitting, ask your health care provider.  Keep all follow-up visits as told by your health care provider. This is important. Contact a health care provider if:  You have a fever.  Your symptoms get worse. Get help right away if:  You have new or worse shortness of breath.  You develop a blue color (cyanosis) under your fingernails. Summary  Pneumonitis is inflammation of the lungs. This condition can be caused by infection or exposure  to certain substances or allergens.  The most common symptom of this condition is shortness of breath or trouble breathing.  Treatment depends on the cause of your condition. This information is not intended to replace advice given to you by your health care  provider. Make sure you discuss any questions you have with your health care provider. Document Released: 10/30/2009 Document Revised: 04/03/2016 Document Reviewed: 04/03/2016 Elsevier Interactive Patient Education  2017 [employer].

## 2017-01-09 LAB — HEPATIC FUNCTION PANEL
AG RATIO: 1.6 (calc) (ref 1.0–2.5)
ALBUMIN MSPROF: 3.6 g/dL (ref 3.6–5.1)
ALT: 22 U/L (ref 9–46)
AST: 17 U/L (ref 10–35)
Alkaline phosphatase (APISO): 67 U/L (ref 40–115)
BILIRUBIN TOTAL: 1.1 mg/dL (ref 0.2–1.2)
Bilirubin, Direct: 0.3 mg/dL — ABNORMAL HIGH (ref 0.0–0.2)
Globulin: 2.3 g/dL (calc) (ref 1.9–3.7)
Indirect Bilirubin: 0.8 mg/dL (calc) (ref 0.2–1.2)
Total Protein: 5.9 g/dL — ABNORMAL LOW (ref 6.1–8.1)

## 2017-01-09 LAB — CBC WITH DIFFERENTIAL/PLATELET
BASOS PCT: 0.6 %
Basophils Absolute: 65 cells/uL (ref 0–200)
EOS PCT: 1.3 %
Eosinophils Absolute: 140 cells/uL (ref 15–500)
HCT: 42.5 % (ref 38.5–50.0)
Hemoglobin: 14.1 g/dL (ref 13.2–17.1)
LYMPHS ABS: 2246 {cells}/uL (ref 850–3900)
MCH: 28.8 pg (ref 27.0–33.0)
MCHC: 33.2 g/dL (ref 32.0–36.0)
MCV: 86.9 fL (ref 80.0–100.0)
MPV: 10 fL (ref 7.5–12.5)
Monocytes Relative: 10.3 %
Neutro Abs: 7236 cells/uL (ref 1500–7800)
Neutrophils Relative %: 67 %
PLATELETS: 301 10*3/uL (ref 140–400)
RBC: 4.89 10*6/uL (ref 4.20–5.80)
RDW: 13.2 % (ref 11.0–15.0)
TOTAL LYMPHOCYTE: 20.8 %
WBC: 10.8 10*3/uL (ref 3.8–10.8)
WBCMIX: 1112 {cells}/uL — AB (ref 200–950)

## 2017-01-09 LAB — BASIC METABOLIC PANEL WITH GFR
BUN: 13 mg/dL (ref 7–25)
CALCIUM: 8.8 mg/dL (ref 8.6–10.3)
CO2: 28 mmol/L (ref 20–32)
CREATININE: 0.83 mg/dL (ref 0.70–1.33)
Chloride: 100 mmol/L (ref 98–110)
GFR, EST NON AFRICAN AMERICAN: 98 mL/min/{1.73_m2} (ref >=60)
GFR, Est African American: 114 mL/min/{1.73_m2} (ref >=60)
Glucose, Bld: 90 mg/dL (ref 65–99)
Potassium: 4.3 mmol/L (ref 3.5–5.3)
Sodium: 137 mmol/L (ref 135–146)

## 2017-01-09 LAB — CULTURE, BLOOD (ROUTINE X 2)
CULTURE: NO GROWTH
Culture: NO GROWTH
SPECIAL REQUESTS: ADEQUATE
SPECIAL REQUESTS: ADEQUATE

## 2017-01-09 LAB — BRAIN NATRIURETIC PEPTIDE: Brain Natriuretic Peptide: 173 pg/mL — ABNORMAL HIGH (ref <100)

## 2017-01-09 LAB — LIPID PANEL
Cholesterol: 110 mg/dL (ref <200)
HDL: 33 mg/dL — ABNORMAL LOW (ref >40)
LDL CHOLESTEROL (CALC): 54 mg/dL
NON-HDL CHOLESTEROL (CALC): 77 mg/dL (ref <130)
TRIGLYCERIDES: 147 mg/dL (ref <150)
Total CHOL/HDL Ratio: 3.3 (calc) (ref <5.0)

## 2017-01-09 LAB — MAGNESIUM: MAGNESIUM: 1.8 mg/dL (ref 1.5–2.5)

## 2017-01-09 LAB — TSH: TSH: 1.43 mIU/L (ref 0.40–4.50)

## 2017-01-12 ENCOUNTER — Other Ambulatory Visit: Payer: Self-pay | Admitting: Physician Assistant

## 2017-01-12 ENCOUNTER — Ambulatory Visit (INDEPENDENT_AMBULATORY_CARE_PROVIDER_SITE_OTHER): Payer: BLUE CROSS/BLUE SHIELD | Admitting: *Deleted

## 2017-01-12 ENCOUNTER — Encounter: Payer: Self-pay | Admitting: Physician Assistant

## 2017-01-12 DIAGNOSIS — R197 Diarrhea, unspecified: Secondary | ICD-10-CM

## 2017-01-12 DIAGNOSIS — J309 Allergic rhinitis, unspecified: Secondary | ICD-10-CM

## 2017-01-13 DIAGNOSIS — J4550 Severe persistent asthma, uncomplicated: Secondary | ICD-10-CM | POA: Diagnosis not present

## 2017-01-14 ENCOUNTER — Ambulatory Visit (INDEPENDENT_AMBULATORY_CARE_PROVIDER_SITE_OTHER): Payer: BLUE CROSS/BLUE SHIELD | Admitting: Orthopaedic Surgery

## 2017-01-14 DIAGNOSIS — Z9889 Other specified postprocedural states: Secondary | ICD-10-CM

## 2017-01-14 MED ORDER — METHYLPREDNISOLONE 4 MG PO TABS: ORAL_TABLET | ORAL | 0 refills | Status: DC

## 2017-01-14 NOTE — Progress Notes (Signed)
The patient is a morbidly obese 56 year old who is status post a left knee arthroscopy. This is his first follow-up visit. Fortunately found a minimal meniscal tear in his knee and intact cartilage. He is doing well from that.  On exam are removed his sutures. His knee shows minimal effusion with good range of motion. Of note he has chronic bilateral shoulder pain and bilateral elbow pain. His elbow pain is consistent with tennis elbow and shoulder pains consistent with impingement syndrome. He also has right knee pain. He has neck pain upper and lower back pain as well.  At this point I'll send him to outpatient physical therapy and see if they can just work on knee strengthening and other modalities to help with this shoulders elbows and back. Also they may consider pain management modalities as well as a TENS unit. I'll see him back in a month's he is doing overall. I did send in a six-day steroid taper as well.

## 2017-01-14 NOTE — Addendum Note (Signed)
Addended by: Meyer Cory on: 01/14/2017 04:58 PM   Modules accepted: Orders

## 2017-01-15 ENCOUNTER — Ambulatory Visit (INDEPENDENT_AMBULATORY_CARE_PROVIDER_SITE_OTHER): Payer: BLUE CROSS/BLUE SHIELD | Admitting: *Deleted

## 2017-01-15 DIAGNOSIS — J4550 Severe persistent asthma, uncomplicated: Secondary | ICD-10-CM | POA: Diagnosis not present

## 2017-01-22 ENCOUNTER — Encounter: Payer: Self-pay | Admitting: Allergy & Immunology

## 2017-01-22 ENCOUNTER — Ambulatory Visit (INDEPENDENT_AMBULATORY_CARE_PROVIDER_SITE_OTHER): Payer: BLUE CROSS/BLUE SHIELD | Admitting: Allergy & Immunology

## 2017-01-22 VITALS — BP 132/82 | HR 98 | Resp 19

## 2017-01-22 DIAGNOSIS — J449 Chronic obstructive pulmonary disease, unspecified: Secondary | ICD-10-CM

## 2017-01-22 DIAGNOSIS — J3089 Other allergic rhinitis: Secondary | ICD-10-CM | POA: Diagnosis not present

## 2017-01-22 DIAGNOSIS — Z91038 Other insect allergy status: Secondary | ICD-10-CM

## 2017-01-22 DIAGNOSIS — Z91030 Bee allergy status: Secondary | ICD-10-CM

## 2017-01-22 DIAGNOSIS — B999 Unspecified infectious disease: Secondary | ICD-10-CM | POA: Diagnosis not present

## 2017-01-22 NOTE — Progress Notes (Signed)
FOLLOW UP  Date of Service/Encounter:  01/22/17   Assessment:   Non-seasonal allergic rhinitis (sweet vernal grass, box elder, cat, weeds, ragweed, molds, cockroach, dust mite) - on allergen immunotherapy  Recurrent infections  Asthma-COPD overlap syndrome - on Nucala monthly  Hymenoptera allergy  GERD - on Nexium   Plan/Recommendations:   1. Recurrent infections - with isolated low IgG - Immunoglobulin replacement was rejected but we will keep it on the back burner for now.  - It seems that the addition of the Nucala and subsequent improvement in pulmonary status may have helped decrease the incidence of infections as well.  - It should be noted that Nucala recently published long-term safety data of patients who have been on the medication for 4.5 years ([facility] Study), with no noted increase in infectious or other serious illnesses compared to previous safety studies on the drug.   2. Asthma-COPD overlap syndrome - stable and improved on Nucala - Lung function looked much better today. - Daily controller medication(s): Symbicort 160/4.5 two puffs twice daily with spacer + Singulair 10mg  daily + Nucala monthly - Rescue medications: ProAir 4 puffs every 4-6 hours as needed or albuterol nebulizer one vial puffs every 4-6 hours as needed - Asthma control goals:  * Full participation in all desired activities (may need albuterol before activity) * Albuterol use two time or less a week on average (not counting use with activity) * Cough interfering with sleep two time or less a month * Oral steroids no more than once a year * No hospitalizations  3. Chronic allergic rhinitis (sweet vernal grass, box elder, cat, weeds, ragweed, molds, cockroach, dust mite) - Continue with Dymista 2 sprays per nostril 1-2 times daily. - Continue with saline mist 1-2 times daily.  - Use cetirizine 10mg  daily as needed for breakthrough symptoms. - Continue with allergy shots at the same schedule.    4. Return in about 3 months (around 04/24/2017).   Subjective:   Mark Harrell is a 56 y.o. male presenting today for follow up of  Chief Complaint  Patient presents with  . Asthma    Mark Harrell has a history of the following: Patient Active Problem List   Diagnosis Date Noted  . Status post arthroscopy of left knee 01/14/2017  . Depression 01/04/2017  . Bronchitis 01/04/2017  . Headache 11/13/2016  . Migraine without aura and without status migrainosus, not intractable 10/30/2016  . Irritant contact dermatitis due to frequent handwashing 06/08/2016  . Sepsis ([place]) 06/03/2016  . Fever 06/03/2016  . COPD mixed type ([place]) 05/29/2016  . DM (diabetes mellitus) ([place]) 05/29/2016  . Lumbar radiculopathy 05/15/2016  . Memory change 05/15/2016  . Osteoarthritis of spine with radiculopathy, lumbar region 05/15/2016  . Risk for falls 05/15/2016  . Acute medial meniscal tear, left, subsequent encounter 04/23/2016  . Recurrent infections 03/29/2016  . Chronic nonseasonal allergic rhinitis due to pollen 03/29/2016  . Glucose intolerance (impaired glucose tolerance) 01/16/2016  . Polypharmacy 01/16/2016  . BMI 47.46,   adult ([place]) 03/06/2015  . SDAT 02/05/2015  . Asthma 02/05/2015  . OSA and COPD overlap syndrome ([place]) 02/05/2015  . Medication management 08/02/2014  . GERD (gastroesophageal reflux disease) 05/09/2014  . Obesity, Class III, BMI 40-49.9 (morbid obesity) ([place]) 02/01/2014  . Vitamin D deficiency 08/01/2013  . Prediabetes 08/01/2013  . 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 Diverticula of colon 05/07/2011  . Hypertension 01/31/2011  . HLD (hyperlipidemia) 01/31/2011  . BPH (benign prostatic hyperplasia) 01/31/2011  .  Testosterone Deficiency 01/31/2011  . IBS (irritable bowel syndrome) 01/31/2011  . Partial complex seizure disorder with intractable epilepsy ([place]) 01/31/2011  . Depression, major, recurrent, in partial remission ([place]) 01/31/2011  . COPD with asthma ([place])  01/31/2011    History obtained from: chart review and patient.  Mark Harrell's Primary Care Provider is [name], MD.     Mark Harrell is a 56 y.o. male presenting for a follow up visit. He has a very complicated past medical history including recurrent sepsis requiring hospitalizations. He was most recently hospitalized earlier this spring. He has a history of severe persistent asthma as well as allergic rhinitis. We added Nucala in Harrell 2018 which seems to have stabilized his asthma. We have done an immune workup in the past which was largely normal aside from a slightly low IgG which was felt to be secondary to chronic prednisone use. We did try to obtain coverage for Hizentra, however it was denied. We have not gone through with a peer-to-peer since we were hoping that the addition of Nucala would improve his health status, which has seemed to work.   He was last seen in May 2018. At that time, his lung function stable. We continued him on Symbicort 160/4.5 g 2 puffs in the morning and 2 puffs at night. He was also continued on Nucala monthly, which she was tolerating well. He has a history of chronic allergic rhinitis including sensitizations to grasses, trees, cats, weeds, molds, cockroach, and dust mite. We continued him on Dymista 2 sprays per nostril 1-2 times daily as well as nasal saline and cetirizine 10 mg daily. He was interested in initiating allergen immunotherapy, and he has since done this. He did have oral candidiasis, which I treated with fluconazole.  Since the last visit, he has had a somewhat complicated course. At the beginning of August, he underwent admission to the hospital for arthroscopic surgery on his left knee. He was discharged and then that night he aspirated with his CPAP at home. He developed fever up to 104 and was admitted to the hospital once again. He was treated with broad-spectrum antibiotics and then discharged home to complete a course of Augmentin. His blood  cultures were no growth to date at discharge. He was admitted for approximately 48 hours.  The recent hospitalization aside, however, his lung function is actually been fairly good. He remains on the Symbicort 2 puffs in the morning and 2 puffs at night as well as the Nucala. He has now received several months of the Nucala, and since starting he feels that his use of the [employer] has decreased markedly. He does continue to endorse fatigue, which he feels might be secondary to his body habitus. He has had no new episodes of oral thrush. He remains on the CPAP nightly, and does have some nighttime awakenings due to shortness of breath with the CPAP. His ACT score is 17 today, indicating fairly good control. He is followed by Dr. Tera Partridge at [facility], with the last visit in July 2018.   [place] is on allergen immunotherapy. He receives two injections. Immunotherapy script #1 contains molds and cockroach. He currently receives 0.62mL of the BLUE vial (1/100,000). Immunotherapy script #2 contains trees, weeds, grasses, dust mites and cat. He currently receives 0.7mL of the BLUE vial (1/100,000). He started shots July of 2018 and has not yet reached maintenance. He continues to have postnasal drip, and anticipates that this will improve once he reaches maintenance on his injections.  He is also looking into getting into a program at [facility] which seems like an intensive weight loss program and overall lifestyle management change program. This is a 12 week program. He should be starting on October 1 although he is awaiting acceptance into the program. He has done his homework and feels that this is a good place for him to start with regards to weight loss. If this does not work, he will go on to pursue surgical interventions such as a LAP-BAND.    Otherwise, there have been no changes to his past medical history, surgical history, family history, or social history.    Review of Systems: a 14-point  review of systems is pertinent for what is mentioned in HPI.  Otherwise, all other systems were negative. Constitutional: negative other than that listed in the HPI Eyes: negative other than that listed in the HPI Ears, nose, mouth, throat, and face: negative other than that listed in the HPI Respiratory: negative other than that listed in the HPI Cardiovascular: negative other than that listed in the HPI Gastrointestinal: negative other than that listed in the HPI Genitourinary: negative other than that listed in the HPI Integument: negative other than that listed in the HPI Hematologic: negative other than that listed in the HPI Musculoskeletal: negative other than that listed in the HPI Neurological: negative other than that listed in the HPI Allergy/Immunologic: negative other than that listed in the HPI    Objective:   Blood pressure 132/82, pulse 98, resp. rate 19, SpO2 94 %. There is no height or weight on file to calculate BMI.   Physical Exam:  General: Alert, interactive, in no acute distress. Obese male. Smiling and very appreciative male.  Eyes: No conjunctival injection present on the right, No conjunctival injection present on the left, PERRL bilaterally, No discharge on the right, No discharge on the left and No Horner-Trantas dots present Ears: Right TM appears hypervascularized but otherwise normal. The left TM appears to have some fluid behind the TM and there is some noted hypervascularization of the TM over the stapes, Right TM intact without perforation and Left TM intact without perforation.  Nose/Throat: External nose within normal limits and septum midline, turbinates edematous and pale with clear discharge, post-pharynx erythematous without cobblestoning in the posterior oropharynx. Tonsils 2+ without exudates Neck: Supple without thyromegaly. Lungs: Clear to auscultation without wheezing, rhonchi or rales. No increased work of breathing. CV: Normal S1/S2, no  murmurs. Capillary refill <2 seconds.  Skin: Warm and dry, without lesions or rashes. Neuro:   Grossly intact. No focal deficits appreciated. Responsive to questions.   Diagnostic studies:   Spirometry: results normal (FEV1: 2.52/73%, FVC: 3.47/77%, FEV1/FVC: 72%).    Spirometry consistent with normal pattern. Overall, lung function is improved since last visit. This is probably the best spirometry I have seen from him in quite some time.  Allergy Studies: none    [name], MD [facility]

## 2017-01-22 NOTE — Patient Instructions (Addendum)
1. Recurrent infections - with isolated low IgG - Immunoglobulin replacement was rejected but we will keep it on the back burner for now.  - It seems that the Nucala may have helped decrease the incidence of infections as well.   2. Asthma-COPD overlap syndrome - Lung function looked much better today. - Daily controller medication(s): Symbicort 160/4.5 two puffs twice daily with spacer - Rescue medications: ProAir 4 puffs every 4-6 hours as needed or albuterol nebulizer one vial puffs every 4-6 hours as needed - Asthma control goals:  * Full participation in all desired activities (may need albuterol before activity) * Albuterol use two time or less a week on average (not counting use with activity) * Cough interfering with sleep two time or less a month * Oral steroids no more than once a year * No hospitalizations  3. Chronic allergic rhinitis (sweet vernal grass, box elder, cat, weeds, ragweed, molds, cockroach, dust mite) - Continue with Dymista 2 sprays per nostril 1-2 times daily. - Continue with saline mist 1-2 times daily.  - Use cetirizine 10mg  daily as needed for breakthrough symptoms. - Continue with allergy shots at the same schedule.   4. Return in about 3 months (around 04/24/2017).  Please inform us of any Emergency Department visits, hospitalizations, or changes in symptoms. Call us before going to the ED for breathing or allergy symptoms since we might be able to fit you in for a sick visit. Feel free to contact us anytime with any questions, problems, or concerns.  It was a pleasure to see you again today! Good luck with getting into the [facility] weight program! Let us know what we can do to help.   Websites that have reliable patient information: 1. American Academy of Asthma, Allergy, and Immunology: www.aaaai.org 2. Food Allergy Research and Education (FARE): foodallergy.org 3. Mothers of Asthmatics: http://www.asthmacommunitynetwork.org 4. American College of  Allergy, Asthma, and Immunology: www.acaai.org

## 2017-01-27 ENCOUNTER — Ambulatory Visit (INDEPENDENT_AMBULATORY_CARE_PROVIDER_SITE_OTHER): Payer: BLUE CROSS/BLUE SHIELD

## 2017-01-27 DIAGNOSIS — R197 Diarrhea, unspecified: Secondary | ICD-10-CM | POA: Diagnosis not present

## 2017-01-27 DIAGNOSIS — J309 Allergic rhinitis, unspecified: Secondary | ICD-10-CM

## 2017-01-28 DIAGNOSIS — R197 Diarrhea, unspecified: Secondary | ICD-10-CM | POA: Diagnosis not present

## 2017-01-28 DIAGNOSIS — K648 Other hemorrhoids: Secondary | ICD-10-CM | POA: Diagnosis not present

## 2017-02-03 DIAGNOSIS — G4733 Obstructive sleep apnea (adult) (pediatric): Secondary | ICD-10-CM | POA: Diagnosis not present

## 2017-02-05 ENCOUNTER — Ambulatory Visit (INDEPENDENT_AMBULATORY_CARE_PROVIDER_SITE_OTHER): Payer: BLUE CROSS/BLUE SHIELD | Admitting: *Deleted

## 2017-02-05 DIAGNOSIS — J309 Allergic rhinitis, unspecified: Secondary | ICD-10-CM

## 2017-02-09 DIAGNOSIS — M4722 Other spondylosis with radiculopathy, cervical region: Secondary | ICD-10-CM | POA: Insufficient documentation

## 2017-02-09 DIAGNOSIS — R413 Other amnesia: Secondary | ICD-10-CM | POA: Diagnosis not present

## 2017-02-10 ENCOUNTER — Encounter: Payer: Self-pay | Admitting: Cardiovascular Disease

## 2017-02-10 ENCOUNTER — Ambulatory Visit (INDEPENDENT_AMBULATORY_CARE_PROVIDER_SITE_OTHER): Payer: BLUE CROSS/BLUE SHIELD | Admitting: Podiatry

## 2017-02-10 ENCOUNTER — Encounter: Payer: Self-pay | Admitting: Podiatry

## 2017-02-10 ENCOUNTER — Ambulatory Visit (INDEPENDENT_AMBULATORY_CARE_PROVIDER_SITE_OTHER): Payer: BLUE CROSS/BLUE SHIELD

## 2017-02-10 ENCOUNTER — Ambulatory Visit (INDEPENDENT_AMBULATORY_CARE_PROVIDER_SITE_OTHER): Payer: BLUE CROSS/BLUE SHIELD | Admitting: Cardiovascular Disease

## 2017-02-10 VITALS — BP 123/75 | HR 81 | Ht 72.0 in | Wt 375.2 lb

## 2017-02-10 DIAGNOSIS — M79676 Pain in unspecified toe(s): Secondary | ICD-10-CM

## 2017-02-10 DIAGNOSIS — I10 Essential (primary) hypertension: Secondary | ICD-10-CM | POA: Diagnosis not present

## 2017-02-10 DIAGNOSIS — R0600 Dyspnea, unspecified: Secondary | ICD-10-CM | POA: Diagnosis not present

## 2017-02-10 DIAGNOSIS — G629 Polyneuropathy, unspecified: Secondary | ICD-10-CM

## 2017-02-10 DIAGNOSIS — E7800 Pure hypercholesterolemia, unspecified: Secondary | ICD-10-CM | POA: Diagnosis not present

## 2017-02-10 DIAGNOSIS — J309 Allergic rhinitis, unspecified: Secondary | ICD-10-CM | POA: Diagnosis not present

## 2017-02-10 DIAGNOSIS — B351 Tinea unguium: Secondary | ICD-10-CM | POA: Diagnosis not present

## 2017-02-10 DIAGNOSIS — Q828 Other specified congenital malformations of skin: Secondary | ICD-10-CM | POA: Diagnosis not present

## 2017-02-10 NOTE — Progress Notes (Signed)
He presents today for follow-up of his neuropathy and his painful elongated toenails.  Objective: Toenails are long thick yellow dystrophic mycotic painful on palpation as well as debridement. No change in her neuropathy.  Assessment: Neuropathy pain elicited onychomycosis.  Plan: Debridement of toenails bilateral foot.

## 2017-02-10 NOTE — Assessment & Plan Note (Signed)
History of hyperlipidemia on statin therapy with recent lipid profile performed 10/02/16 related to cholesterol 120, LDL 52 and HDL of 31.

## 2017-02-10 NOTE — Progress Notes (Signed)
02/10/2017 Mark Harrell   05/20/1961  [id number]  Primary Physician [name], MD Primary Cardiologist: Lorretta Harp MD Mark Harrell, [place]  HPI:  Mark Harrell is a 56 y.o. male morbidly overweight single Caucasian male who children referred by Dr. Melford Aase for cardiovascular evaluation because of hypertension, cardiac enlargement and lower extremity edema. He has no prior cardiac history. His risk factors include treated hypertension and hyperlipidemia. There is no family history. He's never had a heart attack or stroke. He denies chest pain but does get dyspneic on exertion probably related to his body habitus and deconditioning. He also has chronic mild lower extremity edema probably related to his obesity as well. He's been told that he has cardiac enlargement in the past.   Current Meds  Medication Sig  . albuterol (PROAIR HFA) 108 (90 Base) MCG/ACT inhaler INHALE 2 PUFFS INTO THE LUNGS EVERY 4 (FOUR) HOURS AS NEEDED FOR WHEEZING OR SHORTNESS OF BREATH.  [name] albuterol (PROVENTIL) (2.5 MG/3ML) 0.083% nebulizer solution Inhale 2.5 mg into the lungs every 4 (four) hours as needed.  [name] alfuzosin (UROXATRAL) 10 MG 24 hr tablet Take 10 mg by mouth daily.    [name] anastrozole (ARIMIDEX) 1 MG tablet Take 1 mg by mouth daily.  . baclofen (LIORESAL) 10 MG tablet Take 10 mg by mouth 3 (three) times daily as needed for muscle spasms.   . bisoprolol-hydrochlorothiazide (ZIAC) 5-6.25 MG tablet TAKE 1 TABLET BY MOUTH DAILY.  . budesonide-formoterol (SYMBICORT) 160-4.5 MCG/ACT inhaler Inhale 2 puffs into the lungs 2 (two) times daily.  . Calcium Carbonate-Vitamin D (CALCIUM + D PO) Take 500 mg by mouth 2 (two) times daily.   . cetaphil (CETAPHIL) lotion Apply topically 2 (two) times daily.  . cetirizine (ZYRTEC) 10 MG tablet Take 10 mg by mouth daily.  . Cholecalciferol (VITAMIN D PO) Take 10,000-20,000 Units by mouth 2 (two) times daily. 20000 in the morning and 10000 in the evening  .  Cinnamon 500 MG capsule Take 500 mg by mouth 2 (two) times daily.   . cyclobenzaprine (FLEXERIL) 10 MG tablet Take 10 mg by mouth 3 (three) times daily as needed for muscle spasms.  [name] desmopressin (DDAVP) 0.2 MG tablet Take 200 mcg by mouth at bedtime.  . diclofenac sodium (VOLTAREN) 1 % GEL Apply 4 g topically 4 (four) times daily.  [name] donepezil (ARICEPT) 23 MG TABS tablet Take 23 mg by mouth at bedtime.  . DYMISTA 137-50 MCG/ACT SUSP PLACE 2 SPRAYS INTO BOTH NOSTRILS 2 (TWO) TIMES DAILY.  [name] Eluxadoline (VIBERZI) 100 MG TABS Take 1 tablet by mouth 2 (two) times daily.   [name] esomeprazole (NEXIUM) 20 MG capsule Take 20 mg by mouth daily.  [name] gabapentin (NEURONTIN) 600 MG tablet 1,800 mg 3 (three) times daily.   [name] guaiFENesin (MUCINEX) 600 MG 12 hr tablet Take 600 mg by mouth 2 (two) times daily as needed for cough or to loosen phlegm.   . LUTEIN PO Take 1 tablet by mouth 2 (two) times daily.  . memantine (NAMENDA) 10 MG tablet Take 10 mg by mouth 2 (two) times daily.   . Mepolizumab (NUCALA) 100 MG SOLR Pt receives injection once a month  . Methylfol-Algae-B12-Acetylcyst (CEREFOLIN NAC) 6-90.314-2-600 MG TABS Take 1 tablet by mouth daily.   . Misc Natural Products (TURMERIC CURCUMIN) CAPS Take 1 capsule by mouth 2 (two) times daily.  . montelukast (SINGULAIR) 10 MG tablet TAKE 1 TABLET (10 MG TOTAL) BY MOUTH DAILY.  [name]  Multiple Vitamins-Minerals (MULTIVITAMIN WITH MINERALS) tablet Take 1 tablet by mouth daily.  . Omega-3 Krill Oil 1000 MG CAPS Take 1,000 mg by mouth 2 (two) times daily.  . ondansetron (ZOFRAN-ODT) 4 MG disintegrating tablet TAKE 1 TABLET (4 MG TOTAL) BY MOUTH EVERY 6 (SIX) HOURS AS NEEDED FOR NAUSEA OR VOMITING.  [name] oxyCODONE-acetaminophen (PERCOCET) 10-325 MG tablet Take 1 tablet by mouth every 4 (four) hours as needed for pain.  [name] oxymetazoline (AFRIN) 0.05 % nasal spray Place 1 spray into both nostrils See admin instructions. 1 SPRAY PER SIDE EACH NIGHT BEFORE DYMISTA  . pentosan  polysulfate (ELMIRON) 100 MG capsule Take 200 mg by mouth 3 (three) times daily before meals.   . Phosphatidylserine-DHA-EPA (VAYACOG PO) Take 1 capsule by mouth 2 (two) times daily.  . Potassium 99 MG TABS Take 1 tablet by mouth daily.  . pravastatin (PRAVACHOL) 40 MG tablet TAKE 1 TABLET BY MOUTH AT BEDTIME  . PRESCRIPTION MEDICATION Pt receives weekly allergy shots  . sertraline (ZOLOFT) 50 MG tablet Take 50 mg by mouth 2 (two) times daily.   . sodium chloride (OCEAN) 0.65 % SOLN nasal spray Place 1 spray into both nostrils 4 (four) times daily as needed for congestion. Uses each time before other nasal sprays  . tadalafil (CIALIS) 5 MG tablet Take 5 mg by mouth every evening.   . triamcinolone cream (KENALOG) 0.1 % APPLY TOPICALLY 4 TIMES A DAY (Patient taking differently: APPLY TOPICALLY 2 TIMES A DAY AS NEEDED)  . trospium (SANCTURA) 20 MG tablet Take 20 mg by mouth 2 (two) times daily.   [name] ZENPEP 25000-79000 units CPEP Take 2 capsules by mouth 3 (three) times daily.  . [DISCONTINUED] methylPREDNISolone (MEDROL) 4 MG tablet Medrol dose pack. Take as instructed   Current Facility-Administered Medications for the 02/10/17 encounter (Office Visit) with Lorretta Harp, MD  Medication  . Mepolizumab SOLR 100 mg     Allergies  Allergen Reactions  . Bee Venom Swelling  . Ppd [Tuberculin Purified Protein Derivative] Other (See Comments)    +ppd NEG Quantferron Gold 3/13  . Fenofibrate Other (See Comments)    Back pain  . Levofloxacin Diarrhea  . Other Other (See Comments)    Some antibiotics cause diarrhea  . Verapamil Other (See Comments)    Back pain  . Claritin [Loratadine] Other (See Comments)    Social History   Social History  . Marital status: Single    Spouse name: N/A  . Number of children: N/A  . Years of education: N/A   Occupational History  . Dance movement psychotherapist    Social History Main Topics  . Smoking status: Never Smoker  . Smokeless tobacco: Never Used      Comment: significant second-hand exposure through mother  . Alcohol use 0.6 oz/week    1 Glasses of wine per week     Comment: 2 x a year  . Drug use: No  . Sexual activity: No   Other Topics Concern  . Not on file   Social History Narrative   [facility]:   Originally from [place]. Previously has lived in [place]. He has lived in [place], [place], & [place]. He has worked in Engineer, production. No pets currently. Brief exposure to a roommates bird Secretary/administrator) in college. No mold, asbestos, or hot tub exposure.      Review of Systems: General: negative for chills, fever, night sweats or weight changes.  Cardiovascular: negative for chest pain, dyspnea on  exertion, edema, orthopnea, palpitations, paroxysmal nocturnal dyspnea or shortness of breath Dermatological: negative for rash Respiratory: negative for cough or wheezing Urologic: negative for hematuria Abdominal: negative for nausea, vomiting, diarrhea, bright red blood per rectum, melena, or hematemesis Neurologic: negative for visual changes, syncope, or dizziness All other systems reviewed and are otherwise negative except as noted above.    Blood pressure 123/75, pulse 81, height 6' (1.829 m), weight (!) 375 lb 3.2 oz (170.2 kg).  General appearance: alert and no distress Neck: no adenopathy, no carotid bruit, no JVD, supple, symmetrical, trachea midline and thyroid not enlarged, symmetric, no tenderness/mass/nodules Lungs: clear to auscultation bilaterally Heart: regular rate and rhythm, S1, S2 normal, no murmur, click, rub or gallop Extremities: Trace bilateral lower extremity edema with venous stasis changes. Pulses: 2+ and symmetric Skin: Skin color, texture, turgor normal. No rashes or lesions Neurologic: Alert and oriented X 3, normal strength and tone. Normal symmetric reflexes. Normal coordination and gait  EKG not performed today. His most recent 12-lead cardiogram performed 01/04/17 revealed sinus  rhythm at 73 with left axis deviation. I personally reviewed this EKG.  ASSESSMENT AND PLAN:   Hypertension History of essential hypertension blood pressure measures 123/75. He is on Ziac. Continue current meds at current dosing.  HLD (hyperlipidemia) History of hyperlipidemia on statin therapy with recent lipid profile performed 10/02/16 related to cholesterol 120, LDL 52 and HDL of 31.      Lorretta Harp MD FACP,FACC,FAHA, [facility] 02/10/2017 4:37 PM

## 2017-02-10 NOTE — Assessment & Plan Note (Signed)
History of essential hypertension blood pressure measures 123/75. He is on Ziac. Continue current meds at current dosing.

## 2017-02-10 NOTE — Patient Instructions (Signed)
Medication Instructions: Your physician recommends that you continue on your current medications as directed. Please refer to the Current Medication list given to you today.   Testing/Procedures: Your physician has requested that you have an echocardiogram. Echocardiography is a painless test that uses sound waves to create images of your heart. It provides your doctor with information about the size and shape of your heart and how well your heart's chambers and valves are working. This procedure takes approximately one hour. There are no restrictions for this procedure.  Follow-Up: Your physician recommends that you schedule a follow-up appointment as needed with Dr. Berry.   

## 2017-02-11 ENCOUNTER — Ambulatory Visit (INDEPENDENT_AMBULATORY_CARE_PROVIDER_SITE_OTHER): Payer: BLUE CROSS/BLUE SHIELD | Admitting: Orthopaedic Surgery

## 2017-02-11 DIAGNOSIS — J4550 Severe persistent asthma, uncomplicated: Secondary | ICD-10-CM | POA: Diagnosis not present

## 2017-02-11 DIAGNOSIS — Z9889 Other specified postprocedural states: Secondary | ICD-10-CM

## 2017-02-11 MED ORDER — DICLOFENAC SODIUM 75 MG PO TBEC: 75.0000 mg | DELAYED_RELEASE_TABLET | Freq: Two times a day (BID) | ORAL | 3 refills | Status: DC | PRN

## 2017-02-11 NOTE — Progress Notes (Signed)
Although the patient is status post left knee arthroscopy done 40 days ago he has a lot of chronic issues. Today's issue sees a more of his neck. He is radicular symptoms going down his left arm. This is something that has been chronic as well and he does see a neurologist. He is on gabapentin and was told that he may switch to Lyrica at some point but they're dealing with changes at the neurologist office and maybe a little while before he is switch. He is somewhat is morbidly obese as well. He is dealing with chronic back pain as well as bilateral knee pain. Again he is status post a left knee arthroscopy from my standpoint.  On examination of his left knee the knee joint is cool although he has pain with range of motion hip moves well. On exam of the left upper extremity other than subjective decreased sensation he has normal motor function.  This point I do wish physical therapy can work on both his neck and his back and strengthening his knees. Any modalities that can help decrease his pain with even consider a TENS unit would be helpful. We'll see him back in a month. If he still having considerable pain with left upper extremity radicular symptoms we would need an AP and lateral of his cervical spine with the likelihood of needing an MRI of cervical spine as well. I did talk to him about trying diclofenac as an anti-inflammatory and I did send [place] into his pharmacy.

## 2017-02-12 ENCOUNTER — Ambulatory Visit (INDEPENDENT_AMBULATORY_CARE_PROVIDER_SITE_OTHER): Payer: BLUE CROSS/BLUE SHIELD

## 2017-02-12 DIAGNOSIS — M9905 Segmental and somatic dysfunction of pelvic region: Secondary | ICD-10-CM | POA: Diagnosis not present

## 2017-02-12 DIAGNOSIS — J4550 Severe persistent asthma, uncomplicated: Secondary | ICD-10-CM

## 2017-02-12 DIAGNOSIS — M9902 Segmental and somatic dysfunction of thoracic region: Secondary | ICD-10-CM | POA: Diagnosis not present

## 2017-02-12 DIAGNOSIS — M9903 Segmental and somatic dysfunction of lumbar region: Secondary | ICD-10-CM | POA: Diagnosis not present

## 2017-02-12 DIAGNOSIS — M9901 Segmental and somatic dysfunction of cervical region: Secondary | ICD-10-CM | POA: Diagnosis not present

## 2017-02-14 ENCOUNTER — Other Ambulatory Visit: Payer: Self-pay | Admitting: Internal Medicine

## 2017-02-14 MED ORDER — ALBUTEROL SULFATE HFA 108 (90 BASE) MCG/ACT IN AERS: INHALATION_SPRAY | RESPIRATORY_TRACT | 3 refills | Status: DC

## 2017-02-15 ENCOUNTER — Inpatient Hospital Stay (HOSPITAL_COMMUNITY)
Admission: EM | Admit: 2017-02-15 | Discharge: 2017-02-18 | DRG: 871 | Disposition: A | Discharge status: Home Health | Payer: BLUE CROSS/BLUE SHIELD | Attending: Family Medicine | Admitting: Family Medicine

## 2017-02-15 ENCOUNTER — Other Ambulatory Visit: Payer: Self-pay | Admitting: Internal Medicine

## 2017-02-15 ENCOUNTER — Emergency Department (HOSPITAL_COMMUNITY): Payer: BLUE CROSS/BLUE SHIELD

## 2017-02-15 ENCOUNTER — Encounter (HOSPITAL_COMMUNITY): Payer: Self-pay

## 2017-02-15 DIAGNOSIS — N400 Enlarged prostate without lower urinary tract symptoms: Secondary | ICD-10-CM | POA: Diagnosis present

## 2017-02-15 DIAGNOSIS — K589 Irritable bowel syndrome without diarrhea: Secondary | ICD-10-CM | POA: Diagnosis present

## 2017-02-15 DIAGNOSIS — Z79899 Other long term (current) drug therapy: Secondary | ICD-10-CM

## 2017-02-15 DIAGNOSIS — Z833 Family history of diabetes mellitus: Secondary | ICD-10-CM

## 2017-02-15 DIAGNOSIS — Z8701 Personal history of pneumonia (recurrent): Secondary | ICD-10-CM | POA: Diagnosis not present

## 2017-02-15 DIAGNOSIS — J9601 Acute respiratory failure with hypoxia: Secondary | ICD-10-CM

## 2017-02-15 DIAGNOSIS — G4733 Obstructive sleep apnea (adult) (pediatric): Secondary | ICD-10-CM | POA: Diagnosis not present

## 2017-02-15 DIAGNOSIS — J4551 Severe persistent asthma with (acute) exacerbation: Secondary | ICD-10-CM | POA: Diagnosis not present

## 2017-02-15 DIAGNOSIS — F419 Anxiety disorder, unspecified: Secondary | ICD-10-CM | POA: Diagnosis present

## 2017-02-15 DIAGNOSIS — Z888 Allergy status to other drugs, medicaments and biological substances status: Secondary | ICD-10-CM

## 2017-02-15 DIAGNOSIS — T380X5A Adverse effect of glucocorticoids and synthetic analogues, initial encounter: Secondary | ICD-10-CM | POA: Diagnosis present

## 2017-02-15 DIAGNOSIS — E1165 Type 2 diabetes mellitus with hyperglycemia: Secondary | ICD-10-CM | POA: Diagnosis not present

## 2017-02-15 DIAGNOSIS — F32A Depression, unspecified: Secondary | ICD-10-CM | POA: Diagnosis present

## 2017-02-15 DIAGNOSIS — K219 Gastro-esophageal reflux disease without esophagitis: Secondary | ICD-10-CM | POA: Diagnosis not present

## 2017-02-15 DIAGNOSIS — M940 Chondrocostal junction syndrome [Tietze]: Secondary | ICD-10-CM | POA: Diagnosis present

## 2017-02-15 DIAGNOSIS — J449 Chronic obstructive pulmonary disease, unspecified: Secondary | ICD-10-CM

## 2017-02-15 DIAGNOSIS — Z91030 Bee allergy status: Secondary | ICD-10-CM

## 2017-02-15 DIAGNOSIS — E291 Testicular hypofunction: Secondary | ICD-10-CM | POA: Diagnosis present

## 2017-02-15 DIAGNOSIS — Z8249 Family history of ischemic heart disease and other diseases of the circulatory system: Secondary | ICD-10-CM

## 2017-02-15 DIAGNOSIS — F418 Other specified anxiety disorders: Secondary | ICD-10-CM | POA: Diagnosis present

## 2017-02-15 DIAGNOSIS — J189 Pneumonia, unspecified organism: Secondary | ICD-10-CM | POA: Diagnosis not present

## 2017-02-15 DIAGNOSIS — I10 Essential (primary) hypertension: Secondary | ICD-10-CM | POA: Diagnosis present

## 2017-02-15 DIAGNOSIS — M1990 Unspecified osteoarthritis, unspecified site: Secondary | ICD-10-CM | POA: Diagnosis present

## 2017-02-15 DIAGNOSIS — M351 Other overlap syndromes: Secondary | ICD-10-CM | POA: Diagnosis present

## 2017-02-15 DIAGNOSIS — E785 Hyperlipidemia, unspecified: Secondary | ICD-10-CM | POA: Diagnosis not present

## 2017-02-15 DIAGNOSIS — J440 Chronic obstructive pulmonary disease with acute lower respiratory infection: Secondary | ICD-10-CM | POA: Diagnosis present

## 2017-02-15 DIAGNOSIS — Z881 Allergy status to other antibiotic agents status: Secondary | ICD-10-CM

## 2017-02-15 DIAGNOSIS — A419 Sepsis, unspecified organism: Secondary | ICD-10-CM | POA: Diagnosis present

## 2017-02-15 DIAGNOSIS — F0390 Unspecified dementia without behavioral disturbance: Secondary | ICD-10-CM | POA: Diagnosis present

## 2017-02-15 DIAGNOSIS — Y95 Nosocomial condition: Secondary | ICD-10-CM | POA: Diagnosis present

## 2017-02-15 DIAGNOSIS — Z6841 Body Mass Index (BMI) 40.0 and over, adult: Secondary | ICD-10-CM

## 2017-02-15 DIAGNOSIS — J45901 Unspecified asthma with (acute) exacerbation: Secondary | ICD-10-CM | POA: Diagnosis present

## 2017-02-15 DIAGNOSIS — R9431 Abnormal electrocardiogram [ECG] [EKG]: Secondary | ICD-10-CM | POA: Diagnosis not present

## 2017-02-15 DIAGNOSIS — F329 Major depressive disorder, single episode, unspecified: Secondary | ICD-10-CM | POA: Diagnosis not present

## 2017-02-15 DIAGNOSIS — Z7951 Long term (current) use of inhaled steroids: Secondary | ICD-10-CM

## 2017-02-15 DIAGNOSIS — R0602 Shortness of breath: Secondary | ICD-10-CM | POA: Diagnosis not present

## 2017-02-15 LAB — COMPREHENSIVE METABOLIC PANEL
ALBUMIN: 3.2 g/dL — AB (ref 3.5–5.0)
ALT: 21 U/L (ref 17–63)
ANION GAP: 7 (ref 5–15)
AST: 20 U/L (ref 15–41)
Alkaline Phosphatase: 52 U/L (ref 38–126)
BILIRUBIN TOTAL: 1.9 mg/dL — AB (ref 0.3–1.2)
BUN: 8 mg/dL (ref 6–20)
CO2: 31 mmol/L (ref 22–32)
Calcium: 9.1 mg/dL (ref 8.9–10.3)
Chloride: 99 mmol/L — ABNORMAL LOW (ref 101–111)
Creatinine, Ser: 0.86 mg/dL (ref 0.61–1.24)
GFR calc Af Amer: >60 mL/min (ref >60)
GFR calc non Af Amer: >60 mL/min (ref >60)
GLUCOSE: 121 mg/dL — AB (ref 65–99)
POTASSIUM: 4.1 mmol/L (ref 3.5–5.1)
Sodium: 137 mmol/L (ref 135–145)
TOTAL PROTEIN: 5.7 g/dL — AB (ref 6.5–8.1)

## 2017-02-15 LAB — I-STAT VENOUS BLOOD GAS, ED
Acid-Base Excess: 5 mmol/L — ABNORMAL HIGH (ref 0.0–2.0)
BICARBONATE: 32 mmol/L — AB (ref 20.0–28.0)
O2 Saturation: 80 %
PH VEN: 7.405 (ref 7.250–7.430)
PO2 VEN: 45 mmHg (ref 32.0–45.0)
TCO2: 34 mmol/L — ABNORMAL HIGH (ref 22–32)
pCO2, Ven: 51.1 mmHg (ref 44.0–60.0)

## 2017-02-15 LAB — CBC
HEMATOCRIT: 44.1 % (ref 39.0–52.0)
Hemoglobin: 14.2 g/dL (ref 13.0–17.0)
MCH: 29.3 pg (ref 26.0–34.0)
MCHC: 32.2 g/dL (ref 30.0–36.0)
MCV: 90.9 fL (ref 78.0–100.0)
PLATELETS: 257 10*3/uL (ref 150–400)
RBC: 4.85 MIL/uL (ref 4.22–5.81)
RDW: 14.2 % (ref 11.5–15.5)
WBC: 12.9 10*3/uL — ABNORMAL HIGH (ref 4.0–10.5)

## 2017-02-15 LAB — I-STAT TROPONIN, ED
TROPONIN I, POC: 0.01 ng/mL (ref 0.00–0.08)
Troponin i, poc: 0.01 ng/mL (ref 0.00–0.08)

## 2017-02-15 LAB — I-STAT CG4 LACTIC ACID, ED
LACTIC ACID, VENOUS: 1.47 mmol/L (ref 0.5–1.9)
LACTIC ACID, VENOUS: 1.98 mmol/L — AB (ref 0.5–1.9)

## 2017-02-15 LAB — PROCALCITONIN: Procalcitonin: <0.1 ng/mL

## 2017-02-15 LAB — APTT: APTT: 29 s (ref 24–36)

## 2017-02-15 LAB — PROTIME-INR
INR: 1.22
Prothrombin Time: 15.3 seconds — ABNORMAL HIGH (ref 11.4–15.2)

## 2017-02-15 MED ORDER — ONDANSETRON HCL 4 MG/2ML IJ SOLN: 4.0000 mg | Freq: Four times a day (QID) | INTRAMUSCULAR | Status: DC | PRN

## 2017-02-15 MED ORDER — DICLOFENAC SODIUM 1 % TD GEL
4.0000 g | Freq: Four times a day (QID) | TRANSDERMAL | Status: DC
  Administered 2017-02-15 – 2017-02-18 (×8): 4 g via TOPICAL
  Filled 2017-02-15: qty 100

## 2017-02-15 MED ORDER — RISAQUAD PO CAPS
1.0000 | ORAL_CAPSULE | Freq: Two times a day (BID) | ORAL | Status: DC
  Administered 2017-02-16 – 2017-02-18 (×5): 1 via ORAL
  Filled 2017-02-15 (×5): qty 1

## 2017-02-15 MED ORDER — ACETAMINOPHEN 325 MG PO TABS: 650.0000 mg | ORAL_TABLET | Freq: Four times a day (QID) | ORAL | Status: DC | PRN

## 2017-02-15 MED ORDER — VANCOMYCIN HCL 10 G IV SOLR
1250.0000 mg | Freq: Two times a day (BID) | INTRAVENOUS | Status: DC
  Administered 2017-02-16 – 2017-02-17 (×3): 1250 mg via INTRAVENOUS
  Filled 2017-02-15 (×5): qty 1250

## 2017-02-15 MED ORDER — ELUXADOLINE 100 MG PO TABS: 1.0000 | ORAL_TABLET | Freq: Two times a day (BID) | ORAL | Status: DC

## 2017-02-15 MED ORDER — VANCOMYCIN HCL 10 G IV SOLR
2500.0000 mg | Freq: Once | INTRAVENOUS | Status: AC
  Administered 2017-02-15: 2500 mg via INTRAVENOUS
  Filled 2017-02-15 (×2): qty 2500

## 2017-02-15 MED ORDER — ACETAMINOPHEN 650 MG RE SUPP: 650.0000 mg | Freq: Four times a day (QID) | RECTAL | Status: DC | PRN

## 2017-02-15 MED ORDER — MOMETASONE FURO-FORMOTEROL FUM 200-5 MCG/ACT IN AERO
2.0000 | INHALATION_SPRAY | Freq: Two times a day (BID) | RESPIRATORY_TRACT | Status: DC
  Administered 2017-02-16 – 2017-02-18 (×5): 2 via RESPIRATORY_TRACT
  Filled 2017-02-15: qty 8.8

## 2017-02-15 MED ORDER — ENOXAPARIN SODIUM 40 MG/0.4ML ~~LOC~~ SOLN
40.0000 mg | SUBCUTANEOUS | Status: DC
  Administered 2017-02-16 – 2017-02-17 (×2): 40 mg via SUBCUTANEOUS
  Filled 2017-02-15 (×2): qty 0.4

## 2017-02-15 MED ORDER — IPRATROPIUM-ALBUTEROL 0.5-2.5 (3) MG/3ML IN SOLN
3.0000 mL | Freq: Once | RESPIRATORY_TRACT | Status: AC
  Administered 2017-02-15: 3 mL via RESPIRATORY_TRACT
  Filled 2017-02-15: qty 3

## 2017-02-15 MED ORDER — ALFUZOSIN HCL ER 10 MG PO TB24
10.0000 mg | ORAL_TABLET | Freq: Every day | ORAL | Status: DC
  Administered 2017-02-16 – 2017-02-18 (×3): 10 mg via ORAL
  Filled 2017-02-15 (×3): qty 1

## 2017-02-15 MED ORDER — IPRATROPIUM BROMIDE 0.02 % IN SOLN
1.0000 mg | Freq: Once | RESPIRATORY_TRACT | Status: AC
  Administered 2017-02-15: 1 mg via RESPIRATORY_TRACT
  Filled 2017-02-15: qty 5

## 2017-02-15 MED ORDER — OXYCODONE-ACETAMINOPHEN 10-325 MG PO TABS: 1.0000 | ORAL_TABLET | ORAL | Status: DC | PRN

## 2017-02-15 MED ORDER — GUAIFENESIN ER 600 MG PO TB12
600.0000 mg | ORAL_TABLET | Freq: Two times a day (BID) | ORAL | Status: DC | PRN
  Administered 2017-02-15: 600 mg via ORAL
  Filled 2017-02-15: qty 1

## 2017-02-15 MED ORDER — PANTOPRAZOLE SODIUM 40 MG PO TBEC
40.0000 mg | DELAYED_RELEASE_TABLET | Freq: Every day | ORAL | Status: DC
  Administered 2017-02-16 – 2017-02-18 (×3): 40 mg via ORAL
  Filled 2017-02-15 (×3): qty 1

## 2017-02-15 MED ORDER — SODIUM CHLORIDE 0.9 % IV SOLN: INTRAVENOUS | Status: AC

## 2017-02-15 MED ORDER — VANCOMYCIN HCL IN DEXTROSE 1-5 GM/200ML-% IV SOLN: 1000.0000 mg | Freq: Once | INTRAVENOUS | Status: DC

## 2017-02-15 MED ORDER — INSULIN ASPART 100 UNIT/ML ~~LOC~~ SOLN
0.0000 [IU] | Freq: Three times a day (TID) | SUBCUTANEOUS | Status: DC
  Administered 2017-02-16 (×3): 5 [IU] via SUBCUTANEOUS
  Administered 2017-02-17 (×3): 2 [IU] via SUBCUTANEOUS

## 2017-02-15 MED ORDER — DONEPEZIL HCL 23 MG PO TABS
23.0000 mg | ORAL_TABLET | Freq: Every day | ORAL | Status: DC
  Administered 2017-02-15 – 2017-02-17 (×3): 23 mg via ORAL
  Filled 2017-02-15 (×3): qty 1

## 2017-02-15 MED ORDER — PENTOSAN POLYSULFATE SODIUM 100 MG PO CAPS
200.0000 mg | ORAL_CAPSULE | Freq: Three times a day (TID) | ORAL | Status: DC
  Administered 2017-02-16 – 2017-02-17 (×3): 200 mg via ORAL
  Filled 2017-02-15 (×6): qty 2

## 2017-02-15 MED ORDER — SODIUM CHLORIDE 0.9 % IV BOLUS (SEPSIS)
1000.0000 mL | Freq: Once | INTRAVENOUS | Status: AC
  Administered 2017-02-15: 1000 mL via INTRAVENOUS

## 2017-02-15 MED ORDER — METHYLPREDNISOLONE SODIUM SUCC 40 MG IJ SOLR
40.0000 mg | Freq: Three times a day (TID) | INTRAMUSCULAR | Status: DC
  Administered 2017-02-15 – 2017-02-16 (×2): 40 mg via INTRAVENOUS
  Filled 2017-02-15 (×2): qty 1

## 2017-02-15 MED ORDER — PRAVASTATIN SODIUM 40 MG PO TABS
40.0000 mg | ORAL_TABLET | Freq: Every day | ORAL | Status: DC
  Administered 2017-02-16 – 2017-02-17 (×2): 40 mg via ORAL
  Filled 2017-02-15 (×3): qty 1

## 2017-02-15 MED ORDER — LORATADINE 10 MG PO TABS
10.0000 mg | ORAL_TABLET | Freq: Every day | ORAL | Status: DC
  Filled 2017-02-15: qty 1

## 2017-02-15 MED ORDER — ALBUTEROL SULFATE (2.5 MG/3ML) 0.083% IN NEBU: 2.5000 mg | INHALATION_SOLUTION | RESPIRATORY_TRACT | Status: DC | PRN

## 2017-02-15 MED ORDER — MONTELUKAST SODIUM 10 MG PO TABS
10.0000 mg | ORAL_TABLET | Freq: Every day | ORAL | Status: DC
  Administered 2017-02-15 – 2017-02-17 (×3): 10 mg via ORAL
  Filled 2017-02-15 (×3): qty 1

## 2017-02-15 MED ORDER — DEXTROSE 5 % IV SOLN: 2.0000 g | Freq: Once | INTRAVENOUS | Status: DC

## 2017-02-15 MED ORDER — SERTRALINE HCL 50 MG PO TABS
50.0000 mg | ORAL_TABLET | Freq: Two times a day (BID) | ORAL | Status: DC
  Administered 2017-02-15 – 2017-02-18 (×6): 50 mg via ORAL
  Filled 2017-02-15 (×6): qty 1

## 2017-02-15 MED ORDER — SALINE SPRAY 0.65 % NA SOLN
1.0000 | Freq: Four times a day (QID) | NASAL | Status: DC | PRN
  Administered 2017-02-18: 1 via NASAL
  Filled 2017-02-15: qty 44

## 2017-02-15 MED ORDER — ADULT MULTIVITAMIN W/MINERALS CH
1.0000 | ORAL_TABLET | Freq: Every day | ORAL | Status: DC
  Administered 2017-02-16 – 2017-02-18 (×3): 1 via ORAL
  Filled 2017-02-15 (×3): qty 1

## 2017-02-15 MED ORDER — OXYCODONE-ACETAMINOPHEN 5-325 MG PO TABS: 1.0000 | ORAL_TABLET | ORAL | Status: DC | PRN

## 2017-02-15 MED ORDER — PIPERACILLIN-TAZOBACTAM 3.375 G IVPB 30 MIN
3.3750 g | Freq: Once | INTRAVENOUS | Status: AC
  Administered 2017-02-15: 3.375 g via INTRAVENOUS
  Filled 2017-02-15: qty 50

## 2017-02-15 MED ORDER — SODIUM CHLORIDE 0.9% FLUSH
3.0000 mL | Freq: Two times a day (BID) | INTRAVENOUS | Status: DC
  Administered 2017-02-16 – 2017-02-17 (×4): 3 mL via INTRAVENOUS

## 2017-02-15 MED ORDER — INSULIN ASPART 100 UNIT/ML ~~LOC~~ SOLN
0.0000 [IU] | Freq: Every day | SUBCUTANEOUS | Status: DC
  Administered 2017-02-16: 3 [IU] via SUBCUTANEOUS

## 2017-02-15 MED ORDER — MEMANTINE HCL 10 MG PO TABS
10.0000 mg | ORAL_TABLET | Freq: Two times a day (BID) | ORAL | Status: DC
  Administered 2017-02-15 – 2017-02-18 (×6): 10 mg via ORAL
  Filled 2017-02-15 (×3): qty 2
  Filled 2017-02-15: qty 1
  Filled 2017-02-15 (×2): qty 2
  Filled 2017-02-15: qty 1
  Filled 2017-02-15: qty 2
  Filled 2017-02-15 (×4): qty 1

## 2017-02-15 MED ORDER — DESMOPRESSIN ACETATE 0.2 MG PO TABS
200.0000 ug | ORAL_TABLET | Freq: Every day | ORAL | Status: DC
  Administered 2017-02-15 – 2017-02-17 (×3): 200 ug via ORAL
  Filled 2017-02-15 (×3): qty 1

## 2017-02-15 MED ORDER — OMEGA-3-ACID ETHYL ESTERS 1 G PO CAPS
1000.0000 mg | ORAL_CAPSULE | Freq: Two times a day (BID) | ORAL | Status: DC
  Administered 2017-02-16 – 2017-02-18 (×5): 1000 mg via ORAL
  Filled 2017-02-15 (×5): qty 1

## 2017-02-15 MED ORDER — METHYLPREDNISOLONE SODIUM SUCC 125 MG IJ SOLR
125.0000 mg | Freq: Once | INTRAMUSCULAR | Status: AC
  Administered 2017-02-15: 125 mg via INTRAVENOUS
  Filled 2017-02-15: qty 2

## 2017-02-15 MED ORDER — DEXTROSE 5 % IV SOLN
2.0000 g | Freq: Two times a day (BID) | INTRAVENOUS | Status: DC
  Administered 2017-02-15 – 2017-02-17 (×4): 2 g via INTRAVENOUS
  Filled 2017-02-15 (×5): qty 2

## 2017-02-15 MED ORDER — ACETAMINOPHEN 500 MG PO TABS
1000.0000 mg | ORAL_TABLET | Freq: Once | ORAL | Status: AC
  Administered 2017-02-15: 1000 mg via ORAL
  Filled 2017-02-15: qty 2

## 2017-02-15 MED ORDER — PANCRELIPASE (LIP-PROT-AMYL) 12000-38000 UNITS PO CPEP
50000.0000 [IU] | ORAL_CAPSULE | Freq: Three times a day (TID) | ORAL | Status: DC
  Administered 2017-02-16 – 2017-02-18 (×7): 48000 [IU] via ORAL
  Filled 2017-02-15 (×7): qty 4

## 2017-02-15 MED ORDER — LACTATED RINGERS IV BOLUS (SEPSIS)
1000.0000 mL | Freq: Once | INTRAVENOUS | Status: AC
  Administered 2017-02-15: 1000 mL via INTRAVENOUS

## 2017-02-15 MED ORDER — GABAPENTIN 600 MG PO TABS
1800.0000 mg | ORAL_TABLET | Freq: Three times a day (TID) | ORAL | Status: DC
  Administered 2017-02-15 – 2017-02-18 (×8): 1800 mg via ORAL
  Filled 2017-02-15 (×8): qty 3

## 2017-02-15 MED ORDER — OXYCODONE HCL 5 MG PO TABS: 5.0000 mg | ORAL_TABLET | ORAL | Status: DC | PRN

## 2017-02-15 MED ORDER — ALBUTEROL (5 MG/ML) CONTINUOUS INHALATION SOLN
10.0000 mg/h | INHALATION_SOLUTION | Freq: Once | RESPIRATORY_TRACT | Status: AC
  Administered 2017-02-15: 10 mg/h via RESPIRATORY_TRACT
  Filled 2017-02-15: qty 20

## 2017-02-15 MED ORDER — ONDANSETRON HCL 4 MG PO TABS: 4.0000 mg | ORAL_TABLET | Freq: Four times a day (QID) | ORAL | Status: DC | PRN

## 2017-02-15 MED ORDER — DARIFENACIN HYDROBROMIDE ER 7.5 MG PO TB24
7.5000 mg | ORAL_TABLET | Freq: Every day | ORAL | Status: DC
  Administered 2017-02-16 – 2017-02-18 (×3): 7.5 mg via ORAL
  Filled 2017-02-15 (×3): qty 1

## 2017-02-15 MED ORDER — CYCLOBENZAPRINE HCL 10 MG PO TABS
10.0000 mg | ORAL_TABLET | Freq: Three times a day (TID) | ORAL | Status: DC | PRN
  Administered 2017-02-15 – 2017-02-17 (×3): 10 mg via ORAL
  Filled 2017-02-15 (×3): qty 1

## 2017-02-15 MED ORDER — VITAMIN D 1000 UNITS PO TABS
1000.0000 [IU] | ORAL_TABLET | Freq: Two times a day (BID) | ORAL | Status: DC
  Administered 2017-02-16 – 2017-02-18 (×5): 1000 [IU] via ORAL
  Filled 2017-02-15 (×5): qty 1

## 2017-02-15 NOTE — ED Notes (Signed)
[facility] contacted to activate Code Sepsis 

## 2017-02-15 NOTE — ED Notes (Signed)
phlebotomy in room

## 2017-02-15 NOTE — H&P (Signed)
History and Physical    Mark Harrell WUX:[id number] DOB: 1960-07-23 DOA: 02/15/2017  PCP: [name], MD   Patient coming from: Home  Chief Complaint: SOB, cough   HPI: Mark Harrell is a 56 y.o. male with medical history significant for persistent asthma on mepolizumab, depression, hypertension, and dementia, who presented to the emergency department for evaluation of increased dyspnea with productive cough and fevers. Patient reports that he developed significant worsening in his shortness breath approximately 2 days ago. Since this time, he has been using his home nebulizer with increasing frequency, but continues to worsen. He reports fevers and chills. He also describes an increased cough and wheezing. Cough is only occasional productive. No chest pain or palpitations and no lower extremity swelling or tenderness. He had been admitted last month for knee arthroscopy, vomited overnight with CPAP mask on, suffering an aspiration event.  ED Course: Upon arrival to the ED, patient is found to be afebrile, saturating mid 80s on room air, tachypneic, tachycardic, and with stable blood pressure. EKG features a sinus tachycardia with rate 120. Chest x-ray features right upper and lower lobe infiltrates, as well as patchy infiltrate left lung. Chemistry panel is notable for a total bilirubin of 1.9. CBC features a leukocytosis to 12,900, lactic acid is reassuring at 1.47, troponin is within the normal limits. Cultures were collected, 3 L of IV fluids were administered, and the patient was treated with 125 mg IV Solu-Medrol, continuous albuterol, DuoNeb's, and empiric vancomycin and Zosyn. Patient remained in considerable respiratory distress in the ED, but has been hemodynamically stable. He will be admitted to the stepdown unit for ongoing evaluation and management of sepsis secondary to pneumonia with acute exacerbation and asthma and acute hypoxic respiratory failure.  Review of Systems:  All  other systems reviewed and apart from HPI, are negative.  Past Medical History:  Diagnosis Date  . Anesthesia complication requiring reversal agent administration    ? from central apnea, very difficult to get off vent  . Anxiety   . Arthritis    osteo  . Asthma   . BPH (benign prostatic hyperplasia)   . Complication of anesthesia    difficulty waking , they twlight me because of my respiratory problems "  . Dyspnea   . Enlarged heart   . Family history of adverse reaction to anesthesia    mother trouble waking up, and heart stopped  . GERD (gastroesophageal reflux disease)   . Headache    botox injections for headaches  . Hyperlipidemia   . Hypertension   . Hypogonadism male   . IBS (irritable bowel syndrome)   . Obesity   . OSA (obstructive sleep apnea)    cpap  . Pneumonia   . Pre-diabetes   . Prostatitis     Past Surgical History:  Procedure Laterality Date  . ABDOMINAL SURGERY    . ANKLE FRACTURE SURGERY Right   . CYSTOSCOPY     [place]  . KNEE ARTHROSCOPY WITH MEDIAL MENISECTOMY Left 01/02/2017   Procedure: LEFT KNEE ARTHROSCOPY WITH PARTIAL MEDIAL MENISCECTOMY;  Surgeon: Mcarthur Rossetti, MD;  Location: [facility];  Service: Orthopedics;  Laterality: Left;  . TONSILLECTOMY    . [place] RESECTION  2007  . UVULOPALATOPHARYNGOPLASTY       reports that he has never smoked. He has never used smokeless tobacco. He reports that he drinks about 0.6 oz of alcohol per week . He reports that he does not use drugs.  Allergies  Allergen  Reactions  . Bee Venom Swelling  . Ppd [Tuberculin Purified Protein Derivative] Other (See Comments)    +ppd NEG Quantferron Gold 3/13  . Fenofibrate Other (See Comments)    Back pain  . Levofloxacin Diarrhea  . Other Other (See Comments)    Some antibiotics cause diarrhea  . Verapamil Other (See Comments)    Back pain  . Claritin [Loratadine] Other (See Comments)    unknown    Family History  Problem Relation Age of  Onset  . Diabetes Paternal Uncle   . Cancer Father        lymphoma, colon  . Diabetes Maternal Grandmother   . Heart disease Maternal Grandfather   . Diabetes Maternal Grandfather   . Diabetes Paternal Grandmother   . Diabetes Paternal Grandfather   . Dementia Mother   . Prostate cancer Maternal Uncle   . Lung disease Neg Hx   . Rheumatologic disease Neg Hx      Prior to Admission medications   Medication Sig Start Date End Date Taking? Authorizing Provider  albuterol (PROVENTIL) (2.5 MG/3ML) 0.083% nebulizer solution Inhale 2.5 mg into the lungs every 4 (four) hours as needed. 05/27/16  Yes [provider]  alfuzosin (UROXATRAL) 10 MG 24 hr tablet Take 10 mg by mouth daily.     Yes [provider]  anastrozole (ARIMIDEX) 1 MG tablet Take 1 mg by mouth daily. 01/02/17  Yes [provider]  baclofen (LIORESAL) 10 MG tablet Take 10 mg by mouth 3 (three) times daily as needed for muscle spasms.  03/03/16  Yes [provider]  bisoprolol-hydrochlorothiazide (ZIAC) 5-6.25 MG tablet TAKE 1 TABLET BY MOUTH DAILY. 01/12/17  Yes [name], MD  budesonide-formoterol [facility]) 160-4.5 MCG/ACT inhaler Inhale 2 puffs into the lungs 2 (two) times daily. 10/22/16  Yes [name], MD  Calcium Carbonate-Vitamin D (CALCIUM + D PO) Take 500 mg by mouth 2 (two) times daily.    Yes [provider]  cetaphil (CETAPHIL) lotion Apply topically 2 (two) times daily. 06/09/16  Yes Lavina Hamman, MD  cetirizine (ZYRTEC) 10 MG tablet Take 10 mg by mouth daily.   Yes [provider]  Cholecalciferol (VITAMIN D PO) Take 10,000-20,000 Units by mouth 2 (two) times daily. 20000 in the morning and 10000 in the evening   Yes [provider]  Cinnamon 500 MG capsule Take 500 mg by mouth 2 (two) times daily.    Yes [provider]  co-enzyme Q-10 30 MG capsule Take 30 mg by mouth daily.   Yes [provider]  cyclobenzaprine  (FLEXERIL) 10 MG tablet Take 10 mg by mouth 3 (three) times daily as needed for muscle spasms.   Yes [provider]  desmopressin (DDAVP) 0.2 MG tablet Take 200 mcg by mouth at bedtime. 04/10/14  Yes [provider]  diclofenac (VOLTAREN) 75 MG EC tablet Take 1 tablet (75 mg total) by mouth 2 (two) times daily between meals as needed. Patient taking differently: Take 75 mg by mouth 2 (two) times daily between meals as needed for mild pain.  02/11/17  Yes Mcarthur Rossetti, MD  diclofenac sodium (VOLTAREN) 1 % GEL Apply 4 g topically 4 (four) times daily. 01/08/17  Yes Vicie Mutters, PA-C  donepezil (ARICEPT) 23 MG TABS tablet Take 23 mg by mouth at bedtime.   Yes [provider]  DYMISTA 137-50 MCG/ACT SUSP PLACE 2 SPRAYS INTO BOTH NOSTRILS 2 (TWO) TIMES DAILY. 04/10/16  Yes [provider]  Eluxadoline (VIBERZI) 100 MG TABS Take 1 tablet by mouth 2 (two) times daily.    Yes [provider]  esomeprazole (NEXIUM) 20 MG capsule Take 20 mg by mouth daily. 04/29/16  Yes [provider]  gabapentin (NEURONTIN) 600 MG tablet Take 1,800 mg by mouth 3 (three) times daily.  07/28/13  Yes [provider]  guaiFENesin (MUCINEX) 600 MG 12 hr tablet Take 600 mg by mouth 2 (two) times daily as needed for cough or to loosen phlegm.    Yes [provider]  LUTEIN PO Take 1 tablet by mouth 2 (two) times daily.   Yes [provider]  memantine (NAMENDA) 10 MG tablet Take 10 mg by mouth 2 (two) times daily.    Yes [provider]  Mepolizumab (NUCALA) 100 MG SOLR Pt receives injection once a month   Yes [provider]  Methylfol-Algae-B12-Acetylcyst (CEREFOLIN NAC) 6-90.314-2-600 MG TABS Take 1 tablet by mouth daily.  08/03/12  Yes [provider]  Misc Natural Products (TURMERIC CURCUMIN) CAPS Take 1 capsule by mouth 2 (two) times daily.   Yes [provider]  montelukast (SINGULAIR) 10 MG tablet  TAKE 1 TABLET (10 MG TOTAL) BY MOUTH DAILY. 08/28/16  Yes [name], MD  Multiple Vitamins-Minerals (MULTIVITAMIN WITH MINERALS) tablet Take 1 tablet by mouth daily.   Yes [provider]  Omega-3 Krill Oil 1000 MG CAPS Take 1,000 mg by mouth 2 (two) times daily.   Yes [provider]  ondansetron (ZOFRAN-ODT) 4 MG disintegrating tablet TAKE 1 TABLET (4 MG TOTAL) BY MOUTH EVERY 6 (SIX) HOURS AS NEEDED FOR NAUSEA OR VOMITING. 01/12/17  Yes [name], MD  oxyCODONE-acetaminophen (PERCOCET) 10-325 MG tablet Take 1 tablet by mouth every 4 (four) hours as needed for pain. 01/02/17  Yes Mcarthur Rossetti, MD  oxymetazoline (AFRIN) 0.05 % nasal spray Place 1 spray into both nostrils See admin instructions. 1 SPRAY PER SIDE EACH NIGHT BEFORE DYMISTA   Yes [provider]  pentosan polysulfate (ELMIRON) 100 MG capsule Take 200 mg by mouth 3 (three) times daily before meals.    Yes [provider]  Phosphatidylserine-DHA-EPA (VAYACOG PO) Take 1 capsule by mouth 2 (two) times daily. 06/02/16  Yes [provider]  Potassium 99 MG TABS Take 1 tablet by mouth daily.   Yes [provider]  pravastatin (PRAVACHOL) 40 MG tablet TAKE 1 TABLET BY MOUTH AT BEDTIME 08/28/16  Yes [name], MD  PRESCRIPTION MEDICATION Pt receives weekly allergy shots   Yes [provider]  Probiotic Product (PROBIOTIC PO) Take 1 capsule by mouth 2 (two) times daily.   Yes [provider]  sertraline (ZOLOFT) 50 MG tablet Take 50 mg by mouth 2 (two) times daily.    Yes [provider]  sodium chloride (OCEAN) 0.65 % SOLN nasal spray Place 1 spray into both nostrils 4 (four) times daily as needed for congestion. Uses each time before other nasal sprays   Yes [provider]  tadalafil (CIALIS) 5 MG tablet Take 5 mg by mouth every evening.    Yes [provider]  triamcinolone cream (KENALOG) 0.1 % APPLY TOPICALLY 4 TIMES A  DAY Patient taking differently: APPLY TOPICALLY 2 TIMES A DAY AS NEEDED for itching 06/09/16  Yes Forcucci, Courtney, PA-C  trospium (SANCTURA) 20 MG tablet Take 20 mg by mouth 2 (two) times daily.  06/22/15  Yes [provider]  ZENPEP 25000-79000 units CPEP Take 2 capsules by mouth 3 (  three) times daily. 12/04/16  Yes [provider]  albuterol (PROVENTIL HFA;VENTOLIN HFA) 108 (90 Base) MCG/ACT inhaler 1 to 2 inhalations 10 to 15 minutes apart every 4 hrs to rescue Asthma 02/15/17   [name], MD    Physical Exam: Vitals:   02/15/17 1715 02/15/17 1730 02/15/17 1741 02/15/17 1745  BP: 110/71 (!) 86/72  93/64  Pulse: (!) 111 (!) 105  (!) 104  Resp: (!) 22 16  14   Temp:      TempSrc:      SpO2: 92% 94% 95% 93%      Constitutional: Obese, labored breathing, no pallor, no diaphoresis Eyes: PERTLA, lids and conjunctivae normal ENMT: Mucous membranes are moist. Posterior pharynx clear of any exudate or lesions.   Neck: normal, supple, no masses, no thyromegaly Respiratory: Mild tachypnea at rest. Dyspneic with speech. Increased WOB. No pallor or cyanosis.   Cardiovascular: Rate ~110 and regular. No significant extremity edema. No significant JVD. Abdomen: No distension, no tenderness, no masses palpated. Bowel sounds normal.  Musculoskeletal: no clubbing / cyanosis. No joint deformity upper and lower extremities.  Skin: no significant rashes, lesions, ulcers. Warm, dry, well-perfused. Neurologic: CN 2-12 grossly intact. Sensation intact. Strength 5/5 in all 4 limbs.  Psychiatric: Alert and oriented x 3. Pleasant and cooperative.     Labs on Admission: I have personally reviewed following labs and imaging studies  CBC:  Recent Labs Lab 02/15/17 1435  WBC 12.9*  HGB 14.2  HCT 44.1  MCV 90.9  PLT 681   Basic Metabolic Panel:  Recent Labs Lab 02/15/17 1435  NA 137  K 4.1  CL 99*  CO2 31  GLUCOSE 121*  BUN 8  CREATININE 0.86  CALCIUM 9.1    GFR: Estimated Creatinine Clearance: 155.5 mL/min (by C-G formula based on SCr of 0.86 mg/dL). Liver Function Tests:  Recent Labs Lab 02/15/17 1435  AST 20  ALT 21  ALKPHOS 52  BILITOT 1.9*  PROT 5.7*  ALBUMIN 3.2*   No results for input(s): LIPASE, AMYLASE in the last 168 hours. No results for input(s): AMMONIA in the last 168 hours. Coagulation Profile: No results for input(s): INR, PROTIME in the last 168 hours. Cardiac Enzymes: No results for input(s): CKTOTAL, CKMB, CKMBINDEX, TROPONINI in the last 168 hours. BNP (last 3 results) No results for input(s): PROBNP in the last 8760 hours. HbA1C: No results for input(s): HGBA1C in the last 72 hours. CBG: No results for input(s): GLUCAP in the last 168 hours. Lipid Profile: No results for input(s): CHOL, HDL, LDLCALC, TRIG, CHOLHDL, LDLDIRECT in the last 72 hours. Thyroid Function Tests: No results for input(s): TSH, T4TOTAL, FREET4, T3FREE, THYROIDAB in the last 72 hours. Anemia Panel: No results for input(s): VITAMINB12, FOLATE, FERRITIN, TIBC, IRON, RETICCTPCT in the last 72 hours. Urine analysis:    Component Value Date/Time   COLORURINE STRAW (A) 01/03/2017 2204   APPEARANCEUR CLEAR 01/03/2017 2204   LABSPEC 1.014 01/03/2017 2204   PHURINE 5.0 01/03/2017 2204   GLUCOSEU NEGATIVE 01/03/2017 2204   HGBUR NEGATIVE 01/03/2017 2204   BILIRUBINUR NEGATIVE 01/03/2017 2204   KETONESUR NEGATIVE 01/03/2017 2204   PROTEINUR NEGATIVE 01/03/2017 2204   NITRITE NEGATIVE 01/03/2017 2204   LEUKOCYTESUR NEGATIVE 01/03/2017 2204   Sepsis Labs: @LABRCNTIP (procalcitonin:4,lacticidven:4) )No results found for this or any previous visit (from the past 240 hour(s)).   Radiological Exams on Admission: Dg Chest 2 View  Result Date: 02/15/2017 CLINICAL DATA:  Shortness of Breath EXAM: CHEST  2 VIEW COMPARISON:  01/04/17 FINDINGS: Cardiac shadow is within normal limits. The lungs are well aerated bilaterally. Diffuse right upper and  lower lobe infiltrate is seen. The left lung demonstrates some patchy basilar changes as well. Degenerative change of thoracic spine is noted. IMPRESSION: Views right-sided acute infiltrate. Mild left basilar infiltrate is noted as well. Electronically Signed   By: Inez Catalina M.D.   On: 02/15/2017 13:18    EKG: Independently reviewed. Sinus tachycardia (rate 120).   Assessment/Plan  1. Sepsis secondary to PNA, acute hypoxic respiratory failure   - Pt presents with cough, dyspnea, fever, and chills; found to be febrile with leukocytosis, hypoxia, and CXR concerning for PNA  - Blood cultures were collected in ED and patient was treated with 3 liters IVF and empiric vancomycin and Zosyn - Plan to continue empiric broad-spectrums with vancomycin and cefepime given recent admission with IV abx  - Check strep pneumo antigen, trend procalcitonin, follow clinical course and cultures, deescalate abx as possible  2. Asthma with acute exacerbation  - Pt presents with cough, wheeze, hypoxia  - Likely precipitated by PNA as above  - Treated in ED with continuous albuterol neb, DuoNeb, supplemental O2, and 125 mg IV Solu-Medrol  - Plan to continue nebs, supplemental O2, Solu-Medrol   3. Depression - Stable  - Continue Zoloft   4. Dementia  - Seems to be very mild  - Continue Namenda and Aricept    5. Hypertension  - BP on the low side in ED  - Bisoprolol-HCTZ held initially  - Monitor and resume antihypertensives as appropriate    DVT prophylaxis: sq Lovenox Code Status: Full  Family Communication: Discussed with patient Disposition Plan: Admit to SDU Consults called: None Admission status: Inpatient    Vianne Bulls, MD [facility] Pager [phone]  If 7PM-7AM, please contact night-coverage [email or web address] Password [email or web address]  02/15/2017, 6:37 PM

## 2017-02-15 NOTE — ED Provider Notes (Signed)
[facility] DEPT Provider Note   CSN: [id number] Arrival date & time: 02/15/17  1221     History   Chief Complaint Chief Complaint  Patient presents with  . Asthma    HPI Mark Harrell is a 56 y.o. male.  HPI  55 year old male with extensive past medical history with history of hypertension, hyperlipidemia, asthma, on mepolizumab who presents with shortness of breath.Patient states that over the last days, he has had progressively worsening generalized fatigue, cough, and shortness of breath. His symptoms started several days ago when he noticed that he began to feel more short of breath. He also states that he has been coughing more than usual. He's had bilateral chest wall pain that is worse with deep inspiration, but thought this was due to his chronic costochondritis. Over the last 48 hours, he has begun fevers at home, weakness, nausea, and generalized fatigue. He now feel short of breath only at rest and he has had difficulty sleeping due to his severe shortness of breath. He subsequent presents for evaluation. Denies any current chest pain at rest or when not breathing deeply. Denies any lightheadedness. Denies any leg swelling. Of note, he has a history of recurrent pneumonias with similar symptoms. He has been checking his pulse ox at home and states it is been as low as the 70s to 80s.   Past Medical History:  Diagnosis Date  . Anesthesia complication requiring reversal agent administration    ? from central apnea, very difficult to get off vent  . Anxiety   . Arthritis    osteo  . Asthma   . BPH (benign prostatic hyperplasia)   . Complication of anesthesia    difficulty waking , they twlight me because of my respiratory problems "  . Dyspnea   . Enlarged heart   . Family history of adverse reaction to anesthesia    mother trouble waking up, and heart stopped  . GERD (gastroesophageal reflux disease)   . Headache    botox injections for headaches  . Hyperlipidemia    . Hypertension   . Hypogonadism male   . IBS (irritable bowel syndrome)   . Obesity   . OSA (obstructive sleep apnea)    cpap  . Pneumonia   . Pre-diabetes   . Prostatitis     Patient Active Problem List   Diagnosis Date Noted  . Status post arthroscopy of left knee 01/14/2017  . Acute respiratory failure with hypoxia ([place]) 01/04/2017  . Depression 01/04/2017  . Bronchitis 01/04/2017  . Headache 11/13/2016  . Migraine without aura and without status migrainosus, not intractable 10/30/2016  . Irritant contact dermatitis due to frequent handwashing 06/08/2016  . Sepsis due to pneumonia ([place]) 06/03/2016  . Fever 06/03/2016  . Acute asthma exacerbation 05/30/2016  . COPD mixed type ([place]) 05/29/2016  . DM (diabetes mellitus) ([place]) 05/29/2016  . Lumbar radiculopathy 05/15/2016  . Memory change 05/15/2016  . Osteoarthritis of spine with radiculopathy, lumbar region 05/15/2016  . Risk for falls 05/15/2016  . Acute medial meniscal tear, left, subsequent encounter 04/23/2016  . Recurrent infections 03/29/2016  . Chronic nonseasonal allergic rhinitis due to pollen 03/29/2016  . Glucose intolerance (impaired glucose tolerance) 01/16/2016  . Polypharmacy 01/16/2016  . BMI 47.46,   adult ([place]) 03/06/2015  . SDAT 02/05/2015  . Asthma 02/05/2015  . OSA and COPD overlap syndrome ([place]) 02/05/2015  . Medication management 08/02/2014  . GERD (gastroesophageal reflux disease) 05/09/2014  . Obesity, Class III, BMI 40-49.9 (morbid  obesity) ([place]) 02/01/2014  . Vitamin D deficiency 08/01/2013  . Prediabetes 08/01/2013  . 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 Diverticula of colon 05/07/2011  . Hypertension 01/31/2011  . HLD (hyperlipidemia) 01/31/2011  . BPH (benign prostatic hyperplasia) 01/31/2011  . Testosterone Deficiency 01/31/2011  . IBS (irritable bowel syndrome) 01/31/2011  . Partial complex seizure disorder with intractable epilepsy ([place]) 01/31/2011  . Depression, major, recurrent, in  partial remission ([place]) 01/31/2011  . COPD with asthma ([place]) 01/31/2011    Past Surgical History:  Procedure Laterality Date  . ABDOMINAL SURGERY    . ANKLE FRACTURE SURGERY Right   . CYSTOSCOPY     [place]  . KNEE ARTHROSCOPY WITH MEDIAL MENISECTOMY Left 01/02/2017   Procedure: LEFT KNEE ARTHROSCOPY WITH PARTIAL MEDIAL MENISCECTOMY;  Surgeon: Mcarthur Rossetti, MD;  Location: [facility] ORS;  Service: Orthopedics;  Laterality: Left;  . TONSILLECTOMY    . [place] RESECTION  2007  . UVULOPALATOPHARYNGOPLASTY         Home Medications    Prior to Admission medications   Medication Sig Start Date End Date Taking? Authorizing Provider  albuterol (PROVENTIL) (2.5 MG/3ML) 0.083% nebulizer solution Inhale 2.5 mg into the lungs every 4 (four) hours as needed. 05/27/16  Yes [provider]  alfuzosin (UROXATRAL) 10 MG 24 hr tablet Take 10 mg by mouth daily.     Yes [provider]  anastrozole (ARIMIDEX) 1 MG tablet Take 1 mg by mouth daily. 01/02/17  Yes [provider]  baclofen (LIORESAL) 10 MG tablet Take 10 mg by mouth 3 (three) times daily as needed for muscle spasms.  03/03/16  Yes [provider]  bisoprolol-hydrochlorothiazide (ZIAC) 5-6.25 MG tablet TAKE 1 TABLET BY MOUTH DAILY. 01/12/17  Yes [name], MD  budesonide-formoterol [facility]) 160-4.5 MCG/ACT inhaler Inhale 2 puffs into the lungs 2 (two) times daily. 10/22/16  Yes [name], MD  Calcium Carbonate-Vitamin D (CALCIUM + D PO) Take 500 mg by mouth 2 (two) times daily.    Yes [provider]  cetaphil (CETAPHIL) lotion Apply topically 2 (two) times daily. 06/09/16  Yes Lavina Hamman, MD  cetirizine (ZYRTEC) 10 MG tablet Take 10 mg by mouth daily.   Yes [provider]  Cholecalciferol (VITAMIN D PO) Take 10,000-20,000 Units by mouth 2 (two) times daily. 20000 in the morning and 10000 in the evening   Yes [provider]  Cinnamon 500 MG capsule  Take 500 mg by mouth 2 (two) times daily.    Yes [provider]  co-enzyme Q-10 30 MG capsule Take 30 mg by mouth daily.   Yes [provider]  cyclobenzaprine (FLEXERIL) 10 MG tablet Take 10 mg by mouth 3 (three) times daily as needed for muscle spasms.   Yes [provider]  desmopressin (DDAVP) 0.2 MG tablet Take 200 mcg by mouth at bedtime. 04/10/14  Yes [provider]  diclofenac (VOLTAREN) 75 MG EC tablet Take 1 tablet (75 mg total) by mouth 2 (two) times daily between meals as needed. Patient taking differently: Take 75 mg by mouth 2 (two) times daily between meals as needed for mild pain.  02/11/17  Yes Mcarthur Rossetti, MD  diclofenac sodium (VOLTAREN) 1 % GEL Apply 4 g topically 4 (four) times daily. 01/08/17  Yes Vicie Mutters, PA-C  donepezil (ARICEPT) 23 MG TABS tablet Take 23 mg by mouth at bedtime.   Yes [provider]  DYMISTA 137-50 MCG/ACT SUSP PLACE 2 SPRAYS INTO BOTH NOSTRILS 2 (  TWO) TIMES DAILY. 04/10/16  Yes [provider]  Eluxadoline (VIBERZI) 100 MG TABS Take 1 tablet by mouth 2 (two) times daily.    Yes [provider]  esomeprazole (NEXIUM) 20 MG capsule Take 20 mg by mouth daily. 04/29/16  Yes [provider]  gabapentin (NEURONTIN) 600 MG tablet Take 1,800 mg by mouth 3 (three) times daily.  07/28/13  Yes [provider]  guaiFENesin (MUCINEX) 600 MG 12 hr tablet Take 600 mg by mouth 2 (two) times daily as needed for cough or to loosen phlegm.    Yes [provider]  LUTEIN PO Take 1 tablet by mouth 2 (two) times daily.   Yes [provider]  memantine (NAMENDA) 10 MG tablet Take 10 mg by mouth 2 (two) times daily.    Yes [provider]  Mepolizumab (NUCALA) 100 MG SOLR Pt receives injection once a month   Yes [provider]  Methylfol-Algae-B12-Acetylcyst (CEREFOLIN NAC) 6-90.314-2-600 MG TABS Take 1 tablet by mouth daily.  08/03/12  Yes [provider]  Misc Natural Products (TURMERIC CURCUMIN) CAPS Take 1 capsule by mouth 2 (two) times daily.   Yes [provider]  montelukast (SINGULAIR) 10 MG tablet TAKE 1 TABLET (10 MG TOTAL) BY MOUTH DAILY. 08/28/16  Yes [name], MD  Multiple Vitamins-Minerals (MULTIVITAMIN WITH MINERALS) tablet Take 1 tablet by mouth daily.   Yes [provider]  Omega-3 Krill Oil 1000 MG CAPS Take 1,000 mg by mouth 2 (two) times daily.   Yes [provider]  ondansetron (ZOFRAN-ODT) 4 MG disintegrating tablet TAKE 1 TABLET (4 MG TOTAL) BY MOUTH EVERY 6 (SIX) HOURS AS NEEDED FOR NAUSEA OR VOMITING. 01/12/17  Yes [name], MD  oxyCODONE-acetaminophen (PERCOCET) 10-325 MG tablet Take 1 tablet by mouth every 4 (four) hours as needed for pain. 01/02/17  Yes Mcarthur Rossetti, MD  oxymetazoline (AFRIN) 0.05 % nasal spray Place 1 spray into both nostrils See admin instructions. 1 SPRAY PER SIDE EACH NIGHT BEFORE DYMISTA   Yes [provider]  pentosan polysulfate (ELMIRON) 100 MG capsule Take 200 mg by mouth 3 (three) times daily before meals.    Yes [provider]  Phosphatidylserine-DHA-EPA (VAYACOG PO) Take 1 capsule by mouth 2 (two) times daily. 06/02/16  Yes [provider]  Potassium 99 MG TABS Take 1 tablet by mouth daily.   Yes [provider]  pravastatin (PRAVACHOL) 40 MG tablet TAKE 1 TABLET BY MOUTH AT BEDTIME 08/28/16  Yes [name], MD  PRESCRIPTION MEDICATION Pt receives weekly allergy shots   Yes [provider]  Probiotic Product (PROBIOTIC PO) Take 1 capsule by mouth 2 (two) times daily.   Yes [provider]  sertraline (ZOLOFT) 50 MG tablet Take 50 mg by mouth 2 (two) times daily.    Yes [provider]  sodium chloride (OCEAN) 0.65 % SOLN nasal spray Place 1 spray into both nostrils 4 (four) times daily as needed for congestion. Uses each time before other nasal sprays   Yes [provider]  tadalafil (CIALIS) 5 MG tablet Take 5 mg by mouth every evening.    Yes [provider]  triamcinolone cream (KENALOG) 0.1 % APPLY TOPICALLY 4 TIMES A DAY Patient taking differently: APPLY TOPICALLY 2 TIMES A DAY AS NEEDED for itching 06/09/16  Yes Forcucci, Courtney, PA-C  trospium (SANCTURA) 20 MG tablet Take 20 mg by mouth 2 (two) times daily.  06/22/15  Yes [provider]  ZENPEP 25000-79000 units CPEP Take 2 capsules by mouth 3 (three) times daily. 12/04/16  Yes [provider]  albuterol (PROVENTIL HFA;VENTOLIN HFA) 108 (90 Base) MCG/ACT inhaler 1 to 2 inhalations 10 to 15 minutes apart every 4 hrs to rescue Asthma 02/15/17   [name], MD    Family History Family History  Problem Relation Age of Onset  . Diabetes Paternal Uncle   . Cancer Father        lymphoma, colon  . Diabetes Maternal Grandmother   . Heart disease Maternal Grandfather   . Diabetes Maternal Grandfather   . Diabetes Paternal Grandmother   . Diabetes Paternal Grandfather   . Dementia Mother   . Prostate cancer Maternal Uncle   . Lung disease Neg Hx   . Rheumatologic disease Neg Hx     Social History Social History  Substance Use Topics  . Smoking status: Never Smoker  . Smokeless tobacco: Never Used     Comment: significant second-hand exposure through mother  . Alcohol use 0.6 oz/week    1 Glasses of wine per week     Comment: 2 x a year     Allergies   Bee venom; Ppd [tuberculin purified protein derivative]; Fenofibrate; Levofloxacin; Other; Verapamil; and Claritin [loratadine]   Review of Systems Review of Systems  Constitutional: Positive for chills, fatigue and fever.  Respiratory: Positive for cough, shortness of breath and wheezing.   Gastrointestinal: Positive for nausea.  Neurological: Positive for weakness.  All other systems reviewed and are negative.    Physical Exam Updated Vital Signs BP 112/63   Pulse (!) 118   Temp 99.7 F  (37.6 C) (Oral)   Resp (!) 21   SpO2 (!) 88%   Physical Exam  Constitutional: He is oriented to person, place, and time. He appears well-developed and well-nourished. He appears distressed.  HENT:  Head: Normocephalic and atraumatic.  Eyes: Conjunctivae are normal.  Neck: Neck supple.  Cardiovascular: Normal rate, regular rhythm and normal heart sounds.  Exam reveals no friction rub.   No murmur heard. Pulmonary/Chest: Accessory muscle usage present. Tachypnea noted. No respiratory distress. He has decreased breath sounds. He has wheezes in the right upper field, the right middle field, the right lower field, the left upper field, the left middle field and the left lower field. He has rhonchi in the right middle field and the right lower field. He has no rales.  Abdominal: He exhibits no distension.  Musculoskeletal: He exhibits no edema.  Neurological: He is alert and oriented to person, place, and time. He exhibits normal muscle tone.  Skin: Skin is warm. Capillary refill takes less than 2 seconds.  Psychiatric: He has a normal mood and affect.  Nursing note and vitals reviewed.    ED Treatments / Results  Labs (all labs ordered are listed, but only abnormal results are displayed) Labs Reviewed  CBC - Abnormal; Notable for the following:       Result Value   WBC 12.9 (*)    All other components within normal limits  COMPREHENSIVE METABOLIC PANEL - Abnormal; Notable for the following:    Chloride 99 (*)    Glucose, Bld 121 (*)    Total Protein 5.7 (*)    Albumin 3.2 (*)    Total Bilirubin 1.9 (*)    All other components within normal limits  I-STAT VENOUS BLOOD GAS, ED - Abnormal; Notable for the following:    Bicarbonate 32.0 (*)    TCO2 34 (*)  Acid-Base Excess 5.0 (*)    All other components within normal limits  I-STAT CG4 LACTIC ACID, ED - Abnormal; Notable for the following:    Lactic Acid, Venous 1.98 (*)    All other components within normal limits  CULTURE,  BLOOD (ROUTINE X 2)  CULTURE, BLOOD (ROUTINE X 2)  CULTURE, EXPECTORATED SPUTUM-ASSESSMENT  PROCALCITONIN  PROTIME-INR  APTT  COMPREHENSIVE METABOLIC PANEL  CBC WITH DIFFERENTIAL/PLATELET  I-STAT CG4 LACTIC ACID, ED  I-STAT TROPONIN, ED  I-STAT TROPONIN, ED    EKG  EKG Interpretation  Date/Time:  Sunday February 15 2017 12:32:08 EDT Ventricular Rate:  120 PR Interval:  180 QRS Duration: 82 QT Interval:  302 QTC Calculation: 426 R Axis:   -136 Text Interpretation:  Sinus tachycardia Right superior axis deviation Pulmonary disease pattern Right ventricular hypertrophy Abnormal ECG Since last EKG, rate has increased Otherwise no significant change Confirmed by Duffy Bruce [id number]) on 02/15/2017 3:25:02 PM       Radiology Dg Chest 2 View  Result Date: 02/15/2017 CLINICAL DATA:  Shortness of Breath EXAM: CHEST  2 VIEW COMPARISON:  01/04/17 FINDINGS: Cardiac shadow is within normal limits. The lungs are well aerated bilaterally. Diffuse right upper and lower lobe infiltrate is seen. The left lung demonstrates some patchy basilar changes as well. Degenerative change of thoracic spine is noted. IMPRESSION: Views right-sided acute infiltrate. Mild left basilar infiltrate is noted as well. Electronically Signed   By: Inez Catalina M.D.   On: 02/15/2017 13:18    Procedures Procedures (including critical care time)  Medications Ordered in ED Medications  vancomycin (VANCOCIN) 2,500 mg in sodium chloride 0.9 % 500 mL IVPB (2,500 mg Intravenous New Bag/Given 02/15/17 1817)  vancomycin (VANCOCIN) 1,250 mg in sodium chloride 0.9 % 250 mL IVPB (not administered)  lactated ringers bolus 1,000 mL (1,000 mLs Intravenous New Bag/Given 02/15/17 1902)  Probiotic CAPS 1 capsule (not administered)  diclofenac sodium (VOLTAREN) 1 % transdermal gel 4 g (not administered)  Eluxadoline TABS 100 mg (not administered)  oxyCODONE-acetaminophen (PERCOCET) 10-325 MG per tablet 1-2 tablet (not administered)   sodium chloride (OCEAN) 0.65 % nasal spray 1 spray (not administered)  Pancrelipase (Lip-Prot-Amyl) 25000-79000 units CPEP 2 capsule (not administered)  mometasone-formoterol (DULERA) 200-5 MCG/ACT inhaler 2 puff (not administered)  cyclobenzaprine (FLEXERIL) tablet 10 mg (not administered)  montelukast (SINGULAIR) tablet 10 mg (not administered)  pravastatin (PRAVACHOL) tablet 40 mg (not administered)  Omega-3 Krill Oil CAPS 1,000 mg (not administered)  albuterol (PROVENTIL) (2.5 MG/3ML) 0.083% nebulizer solution 2.5 mg (not administered)  pantoprazole (PROTONIX) EC tablet 40 mg (not administered)  guaiFENesin (MUCINEX) 12 hr tablet 600 mg (not administered)  cholecalciferol (VITAMIN D) tablet 1,000 Units (not administered)  darifenacin (ENABLEX) 24 hr tablet 7.5 mg (not administered)  donepezil (ARICEPT) tablet 23 mg (not administered)  memantine (NAMENDA) tablet 10 mg (not administered)  desmopressin (DDAVP) tablet 200 mcg (not administered)  sertraline (ZOLOFT) tablet 50 mg (not administered)  gabapentin (NEURONTIN) tablet 1,800 mg (not administered)  loratadine (CLARITIN) tablet 10 mg (not administered)  multivitamin with minerals tablet 1 tablet (not administered)  alfuzosin (UROXATRAL) 24 hr tablet 10 mg (not administered)  pentosan polysulfate (ELMIRON) capsule 200 mg (not administered)  enoxaparin (LOVENOX) injection 40 mg (not administered)  sodium chloride flush (NS) 0.9 % injection 3 mL (not administered)  0.9 %  sodium chloride infusion (not administered)  acetaminophen (TYLENOL) tablet 650 mg (not administered)    Or  acetaminophen (TYLENOL) suppository 650 mg (not administered)  ondansetron (  ZOFRAN) tablet 4 mg (not administered)    Or  ondansetron (ZOFRAN) injection 4 mg (not administered)  insulin aspart (novoLOG) injection 0-9 Units (not administered)  insulin aspart (novoLOG) injection 0-5 Units (not administered)  ceFEPIme (MAXIPIME) 2 g in dextrose 5 % 50 mL  IVPB (not administered)  sodium chloride 0.9 % bolus 1,000 mL (0 mLs Intravenous Stopped 02/15/17 1855)  piperacillin-tazobactam (ZOSYN) IVPB 3.375 g (0 g Intravenous Stopped 02/15/17 1742)  acetaminophen (TYLENOL) tablet 1,000 mg (1,000 mg Oral Given 02/15/17 1712)  methylPREDNISolone sodium succinate (SOLU-MEDROL) 125 mg/2 mL injection 125 mg (125 mg Intravenous Given 02/15/17 1712)  ipratropium-albuterol (DUONEB) 0.5-2.5 (3) MG/3ML nebulizer solution 3 mL (3 mLs Nebulization Given 02/15/17 1712)  sodium chloride 0.9 % bolus 1,000 mL (1,000 mLs Intravenous New Bag/Given 02/15/17 1713)  albuterol (PROVENTIL,VENTOLIN) solution continuous neb (10 mg/hr Nebulization Given 02/15/17 1740)  ipratropium (ATROVENT) nebulizer solution 1 mg (1 mg Nebulization Given 02/15/17 1740)     Initial Impression / Assessment and Plan / ED Course  I have reviewed the triage vital signs and the nursing notes.  Pertinent labs & imaging results that were available during my care of the patient were reviewed by me and considered in my medical decision making (see chart for details).     56 year old male with past medical history as above who presents with fever, tachycardia, hypoxia, and increased work of breathing. Chest x-ray concerning for significant right middle and lower lobe pneumonia. Code sepsis initiated and patient started on IV fluids as well as IV antibiotics. He has no hypertension to suggest severe sepsis at this time so we will be cautious with fluids. Otherwise, will give Zosyn to cover for possible aspiration, as he has a history of same.  Labwork reviewed. Patient has leukocytosis. Reassuring lactic acid. His blood gas is reassuring as well. Patient remains mildly tachycardic with borderline soft blood pressures after fluids. Will continue cautious fluid resuscitation, broad-spectrum antibiotics, and admit to stepdown unit.  Final Clinical Impressions(s) / ED Diagnoses   Final diagnoses:  Severe  persistent asthma with exacerbation  HCAP (healthcare-associated pneumonia)    New Prescriptions New Prescriptions   No medications on file     Duffy Bruce, MD 02/15/17 1906

## 2017-02-15 NOTE — Progress Notes (Addendum)
Pharmacy Antibiotic Note  Mark Harrell is a 56 y.o. male admitted on 02/15/2017 with asthma exacerbation. Starting broad spectrum antibiotics for possible aspiration pneumonia. WBC 12.9, lactic acid wnl, afebrile.   Plan: -Vancomycin 2500 mg IV x1 then 1250 mg IV q12h -Monitor renal fx, cultures, obtain trough at [facility]     Temp (24hrs), Avg:99.7 F (37.6 C), Min:99.7 F (37.6 C), Max:99.7 F (37.6 C)   Recent Labs Lab 02/15/17 1435 02/15/17 1449  WBC 12.9*  --   CREATININE 0.86  --   LATICACIDVEN  --  1.47    Estimated Creatinine Clearance: 155.5 mL/min (by C-G formula based on SCr of 0.86 mg/dL).    Allergies  Allergen Reactions  . Bee Venom Swelling  . Ppd [Tuberculin Purified Protein Derivative] Other (See Comments)    +ppd NEG Quantferron Gold 3/13  . Fenofibrate Other (See Comments)    Back pain  . Levofloxacin Diarrhea  . Other Other (See Comments)    Some antibiotics cause diarrhea  . Verapamil Other (See Comments)    Back pain  . Claritin [Loratadine] Other (See Comments)    Antimicrobials this admission: 9/23 zosyn > 9/23 vancomycin >  Dose adjustments this admission: N/A  Microbiology results: 9/23 blood cx:   Harvel Quale 02/15/2017 3:42 PM   Addendum -Adding cefepime -Cefepime 2 g IV q12h -Pt already got one dose of Zosyn in ED  Harvel Quale  02/15/2017 6:59 PM

## 2017-02-15 NOTE — ED Triage Notes (Signed)
Pt presents for evaluation of asthma exacerbation and SOB x 2 days. Pt reports worsening over past two days. Pt able to speak in complete sentences, reports dyspnea with exertion. Take multiple OTC cold medications and reports fevers at home. Pt reports using multiple nebs throughout day.

## 2017-02-15 NOTE — Progress Notes (Signed)
Placed patient on CPAP for the night via auto-mode with minimum pressure set at 10cm and maximum pressure ser ar 20cm. Oxygen set at 4lpm with Sp02=93%

## 2017-02-16 ENCOUNTER — Ambulatory Visit: Payer: BLUE CROSS/BLUE SHIELD

## 2017-02-16 LAB — COMPREHENSIVE METABOLIC PANEL
ALT: 18 U/L (ref 17–63)
AST: 70 U/L — ABNORMAL HIGH (ref 15–41)
Albumin: 3.2 g/dL — ABNORMAL LOW (ref 3.5–5.0)
Alkaline Phosphatase: 45 U/L (ref 38–126)
Anion gap: 12 (ref 5–15)
BUN: 12 mg/dL (ref 6–20)
CO2: 26 mmol/L (ref 22–32)
Calcium: 8.9 mg/dL (ref 8.9–10.3)
Chloride: 99 mmol/L — ABNORMAL LOW (ref 101–111)
Creatinine, Ser: 1.1 mg/dL (ref 0.61–1.24)
GFR calc Af Amer: >60 mL/min (ref >60)
GFR calc non Af Amer: >60 mL/min (ref >60)
Glucose, Bld: 287 mg/dL — ABNORMAL HIGH (ref 65–99)
Potassium: 4.8 mmol/L (ref 3.5–5.1)
Sodium: 137 mmol/L (ref 135–145)
Total Bilirubin: 3.1 mg/dL — ABNORMAL HIGH (ref 0.3–1.2)
Total Protein: 6.1 g/dL — ABNORMAL LOW (ref 6.5–8.1)

## 2017-02-16 LAB — CBC WITH DIFFERENTIAL/PLATELET
BASOS ABS: 0 10*3/uL (ref 0.0–0.1)
Basophils Relative: 0 %
EOS ABS: 0 10*3/uL (ref 0.0–0.7)
EOS PCT: 0 %
HCT: 43.3 % (ref 39.0–52.0)
HEMOGLOBIN: 13.7 g/dL (ref 13.0–17.0)
LYMPHS ABS: 1.1 10*3/uL (ref 0.7–4.0)
LYMPHS PCT: 9 %
MCH: 29.1 pg (ref 26.0–34.0)
MCHC: 31.6 g/dL (ref 30.0–36.0)
MCV: 91.9 fL (ref 78.0–100.0)
Monocytes Absolute: 0.3 10*3/uL (ref 0.1–1.0)
Monocytes Relative: 3 %
NEUTROS PCT: 88 %
Neutro Abs: 11.5 10*3/uL — ABNORMAL HIGH (ref 1.7–7.7)
PLATELETS: 283 10*3/uL (ref 150–400)
RBC: 4.71 MIL/uL (ref 4.22–5.81)
RDW: 14.8 % (ref 11.5–15.5)
WBC: 12.9 10*3/uL — AB (ref 4.0–10.5)

## 2017-02-16 LAB — URINALYSIS, ROUTINE W REFLEX MICROSCOPIC
BACTERIA UA: NONE SEEN
BILIRUBIN URINE: NEGATIVE
Glucose, UA: NEGATIVE mg/dL
HGB URINE DIPSTICK: NEGATIVE
KETONES UR: NEGATIVE mg/dL
Leukocytes, UA: NEGATIVE
NITRITE: NEGATIVE
PROTEIN: 30 mg/dL — AB
Specific Gravity, Urine: 1.025 (ref 1.005–1.030)
pH: 6 (ref 5.0–8.0)

## 2017-02-16 LAB — GLUCOSE, CAPILLARY
GLUCOSE-CAPILLARY: 263 mg/dL — AB (ref 65–99)
GLUCOSE-CAPILLARY: 286 mg/dL — AB (ref 65–99)
Glucose-Capillary: 289 mg/dL — ABNORMAL HIGH (ref 65–99)
Glucose-Capillary: 297 mg/dL — ABNORMAL HIGH (ref 65–99)

## 2017-02-16 LAB — MRSA PCR SCREENING: MRSA by PCR: NEGATIVE

## 2017-02-16 LAB — EXPECTORATED SPUTUM ASSESSMENT W GRAM STAIN, RFLX TO RESP C

## 2017-02-16 MED ORDER — HYDROCOD POLST-CPM POLST ER 10-8 MG/5ML PO SUER
5.0000 mL | Freq: Once | ORAL | Status: AC
  Administered 2017-02-16: 5 mL via ORAL
  Filled 2017-02-16: qty 5

## 2017-02-16 MED ORDER — IPRATROPIUM-ALBUTEROL 0.5-2.5 (3) MG/3ML IN SOLN
3.0000 mL | Freq: Four times a day (QID) | RESPIRATORY_TRACT | Status: DC
  Administered 2017-02-16 – 2017-02-17 (×5): 3 mL via RESPIRATORY_TRACT
  Filled 2017-02-16 (×5): qty 3

## 2017-02-16 MED ORDER — HYDROCOD POLST-CPM POLST ER 10-8 MG/5ML PO SUER
5.0000 mL | Freq: Two times a day (BID) | ORAL | Status: DC
  Administered 2017-02-16 – 2017-02-18 (×5): 5 mL via ORAL
  Filled 2017-02-16 (×5): qty 5

## 2017-02-16 MED ORDER — IPRATROPIUM BROMIDE 0.02 % IN SOLN: 1.0000 mg | RESPIRATORY_TRACT | Status: DC | PRN

## 2017-02-16 MED ORDER — METHYLPREDNISOLONE SODIUM SUCC 40 MG IJ SOLR: 40.0000 mg | Freq: Every day | INTRAMUSCULAR | Status: DC

## 2017-02-16 NOTE — Progress Notes (Signed)
Pt removed CPAP and reapplied O2 via [place]. Pt states he was coughing a lot and was afraid he would aspirate. Currently O2 sats 89-92%. Will continue to monitor. Jessie Foot, RN

## 2017-02-16 NOTE — Progress Notes (Signed)
Inpatient Diabetes Program Recommendations  AACE/ADA: New Consensus Statement on Inpatient Glycemic Control (2015)  Target Ranges:  Prepandial:   less than 140 mg/dL      Peak postprandial:   less than 180 mg/dL (1-2 hours)      Critically ill patients:  140 - 180 mg/dL   Lab Results  Component Value Date   GLUCAP 286 (H) 02/16/2017   HGBA1C 5.5 10/02/2016    Review of Glycemic ControlResults for YON, SCHIFFMAN (MRN [id number]) as of 02/16/2017 13:24  Ref. Range 02/16/2017 08:25 02/16/2017 12:35  Glucose-Capillary Latest Ref Range: 65 - 99 mg/dL 263 (H) 286 (H)   Diabetes history: Pre-diabetes Outpatient Diabetes medications: None Current orders for Inpatient glycemic control:  Novolog sensitive tid with meals and HS  Inpatient Diabetes Program Recommendations:   Please consider increasing Novolog correction to resistant tid with meals and HS.  Also may need low dose basal while on steroids, such as Levemir 20 units daily.    Thanks, Adah Perl, RN, BC-ADM Inpatient Diabetes Coordinator Pager [phone] (8a-5p)

## 2017-02-16 NOTE — Progress Notes (Signed)
Patient ID: Mark Harrell, male   DOB: Mar 15, 1961, 56 y.o.   MRN: [id number]  PROGRESS NOTE    Mark Harrell  XNA:[id number] DOB: 01-11-1961 DOA: 02/15/2017  PCP: Mark Pinto, MD   Brief Narrative:  56 year old male with history of asthma, depression, hypertension, dementia who presented to ED with worsening shortness or breath at rest and exertion, productive cough of clear sputum, fevers for past 2 days PTA. He used bronchodilator treatment at home but will little symptomatic improvement. In ED, pt was afebrile. His O2 sat was in 80's on room air but it has improved to 90's with [place] oxygen support. CXR showed patchy right side infiltrate concerning for pneumonia. WBC count was 12.9. Lactic acid was WNL, troponin was WNL Pt was given vanco and zosyn in ED as well as duoneb and solumedrol. He was admitted for management of sepsis and pneumonia.   Assessment & Plan:   Principal Problem: Sepsis due to pneumonia ([place]) / Leukocytosis  - Sepsis criteria met on admission with low grade fever, tachycardia, tachypnea, hypoxia, hypotension, leukocytosis - Lactic acid is WNL - UA not collected on admission but will place order for UA now - CXR showed patchy acute right side infiltrate concerning for pneumonia  - Blood cultures and urine culture ordered - Started broad spectrum antibiotics: vanco and cefepime   Active Problems: Severe persistent asthma with exacerbation / Acute respiratory failure with hypoxia  - Started albuterol and atrovent every 2 hours PRN shortness of breath or wheezing - Started duoneb every 6 hours scheduled - Continue solumedrol 40 mg IV daily - Continue oxygen support via [place] to keep O2 sats above 90%  Steroid induced hyperglycemia - Use SSI  Dyslipidemia - Continue Pravachol and omega 3 supplementation   Dementia without behavioral disturbance - Continue aricept and nemanda   Depression - Continue Zoloft   Hypertension, essential - BP meds on hold due to  hypotension on admission - BP 118/61 this am  Morbid obesity due to excess calories - Body mass index is 52.22 kg/m. - Counseled on diet   DVT prophylaxis: Lovenox subQ Code Status: full code  Family Communication: No family at the bedside Disposition Plan: not yet stable for discharge, awaiting blood and urine cx results    Consultants:   None   Procedures:   None   Antimicrobials:   Vanco and cefepime 9/23 -->   Subjective: No overnight events.  Objective: Vitals:   02/16/17 0013 02/16/17 0405 02/16/17 0726 02/16/17 0850  BP: 139/64 118/61    Pulse: (!) 108 (!) 104    Resp: 17 19    Temp: 98.5 F (36.9 C) 98.1 F (36.7 C) 98.5 F (36.9 C)   TempSrc: Oral Oral Oral   SpO2: 93%   94%  Weight:  (!) 174.6 kg (385 lb)    Height:        Intake/Output Summary (Last 24 hours) at 02/16/17 0856 Last data filed at 02/16/17 0030  Gross per 24 hour  Intake             2050 ml  Output             1050 ml  Net             1000 ml   Filed Weights   02/15/17 2020 02/16/17 0405  Weight: (!) 174.6 kg (385 lb) (!) 174.6 kg (385 lb)    Examination:  General exam: Appears calm and comfortable  Respiratory system:  coarse breath sounds, no wheezing  Cardiovascular system: S1 & S2 heard, RRR.  Gastrointestinal system: Abdomen is obese, soft and nontender. No organomegaly or masses felt. Normal bowel sounds heard. Central nervous system: Alert and oriented. No focal neurological deficits. Extremities: Symmetric 5 x 5 power. Skin: No rashes, lesions or ulcers Psychiatry: Judgement and insight appear normal. Mood & affect appropriate.   Data Reviewed: I have personally reviewed following labs and imaging studies  CBC:  Recent Labs Lab 02/15/17 1435 02/16/17 0333  WBC 12.9* 12.9*  NEUTROABS  --  11.5*  HGB 14.2 13.7  HCT 44.1 43.3  MCV 90.9 91.9  PLT 257 510   Basic Metabolic Panel:  Recent Labs Lab 02/15/17 1435 02/16/17 0333  NA 137 137  K 4.1 4.8    CL 99* 99*  CO2 31 26  GLUCOSE 121* 287*  BUN 8 12  CREATININE 0.86 1.10  CALCIUM 9.1 8.9   GFR: Estimated Creatinine Clearance: 123.5 mL/min (by C-G formula based on SCr of 1.1 mg/dL). Liver Function Tests:  Recent Labs Lab 02/15/17 1435 02/16/17 0333  AST 20 70*  ALT 21 18  ALKPHOS 52 45  BILITOT 1.9* 3.1*  PROT 5.7* 6.1*  ALBUMIN 3.2* 3.2*   No results for input(s): LIPASE, AMYLASE in the last 168 hours. No results for input(s): AMMONIA in the last 168 hours. Coagulation Profile:  Recent Labs Lab 02/15/17 1934  INR 1.22   Cardiac Enzymes: No results for input(s): CKTOTAL, CKMB, CKMBINDEX, TROPONINI in the last 168 hours. BNP (last 3 results) No results for input(s): PROBNP in the last 8760 hours. HbA1C: No results for input(s): HGBA1C in the last 72 hours. CBG:  Recent Labs Lab 02/16/17 0825  GLUCAP 263*   Lipid Profile: No results for input(s): CHOL, HDL, LDLCALC, TRIG, CHOLHDL, LDLDIRECT in the last 72 hours. Thyroid Function Tests: No results for input(s): TSH, T4TOTAL, FREET4, T3FREE, THYROIDAB in the last 72 hours. Anemia Panel: No results for input(s): VITAMINB12, FOLATE, FERRITIN, TIBC, IRON, RETICCTPCT in the last 72 hours. Urine analysis:  Sepsis Labs: '@LABRCNTIP'$ (procalcitonin:4,lacticidven:4)   MRSA PCR Screening     Status: None   Collection Time: 02/15/17 10:30 PM  Result Value Ref Range Status   MRSA by PCR NEGATIVE NEGATIVE Final  Culture, sputum-assessment     Status: None   Collection Time: 02/16/17  1:14 AM  Result Value Ref Range Status   Specimen Description SPUTUM  Final   Special Requests Immunocompromised  Final   Sputum evaluation THIS SPECIMEN IS ACCEPTABLE FOR SPUTUM CULTURE  Final   Report Status 02/16/2017 FINAL  Final  Culture, respiratory (NON-Expectorated)     Status: None (Preliminary result)   Collection Time: 02/16/17  1:14 AM  Result Value Ref Range Status   Specimen Description SPUTUM  Final   Special  Requests Immunocompromised Reflexed from [id number]  Final   Gram Stain   Final    FEW WBC PRESENT, PREDOMINANTLY PMN FEW SQUAMOUS EPITHELIAL CELLS PRESENT FEW GRAM POSITIVE COCCI IN PAIRS RARE GRAM NEGATIVE COCCOBACILLI RARE GRAM POSITIVE RODS    Culture PENDING  Incomplete   Report Status PENDING  Incomplete      Radiology Studies: Dg Chest 2 View Result Date: 02/15/2017 Views right-sided acute infiltrate. Mild left basilar infiltrate is noted as well.     Scheduled Meds: . acidophilus  1 capsule Oral BID  . alfuzosin  10 mg Oral Daily  . chlorpheniramine-HYDROcodone  5 mL Oral Q12H  . cholecalciferol  1,000 Units Oral  BID  . darifenacin  7.5 mg Oral Daily  . desmopressin  200 mcg Oral QHS  . diclofenac sodium  4 g Topical QID  . donepezil  23 mg Oral QHS  . Eluxadoline  1 tablet Oral BID  . enoxaparin   40 mg Subcutaneous Q24H  . gabapentin  1,800 mg Oral TID  . insulin aspart  0-5 Units Subcutaneous QHS  . insulin aspart  0-9 Units Subcutaneous TID WC  . lipase/protease/amylase  48,000 Units Oral TID  . loratadine  10 mg Oral Daily  . memantine  10 mg Oral BID  . methylPREDNISolo  40 mg Intravenous Q8H  . mometasone-formoterol  2 puff Inhalation BID  . montelukast  10 mg Oral QHS  . multivitamin with minerals  1 tablet Oral Daily  . omega-3 acid ethyl   1,000 mg Oral BID  . pantoprazole  40 mg Oral Daily  . pentosan polysulfate  200 mg Oral TID AC  . pravastatin  40 mg Oral q1800  . sertraline  50 mg Oral BID   Continuous Infusions: . ceFEPime (MAXIPIME) IV Stopped (02/15/17 2237)  . vancomycin 1,250 mg (02/16/17 0842)     LOS: 1 day    Time spent: 25 minutes  Greater than 50% of the time spent on counseling and coordinating the care.   Leisa Lenz, MD [facility] Pager [phone]  If 7PM-7AM, please contact night-coverage [email or web address] Password [facility] 02/16/2017, 8:56 AM

## 2017-02-17 DIAGNOSIS — J189 Pneumonia, unspecified organism: Secondary | ICD-10-CM

## 2017-02-17 DIAGNOSIS — A419 Sepsis, unspecified organism: Principal | ICD-10-CM

## 2017-02-17 LAB — GLUCOSE, CAPILLARY
GLUCOSE-CAPILLARY: 156 mg/dL — AB (ref 65–99)
GLUCOSE-CAPILLARY: 191 mg/dL — AB (ref 65–99)
Glucose-Capillary: 135 mg/dL — ABNORMAL HIGH (ref 65–99)
Glucose-Capillary: 192 mg/dL — ABNORMAL HIGH (ref 65–99)
Glucose-Capillary: 227 mg/dL — ABNORMAL HIGH (ref 65–99)

## 2017-02-17 LAB — BASIC METABOLIC PANEL
ANION GAP: 11 (ref 5–15)
BUN: 19 mg/dL (ref 6–20)
CALCIUM: 8.7 mg/dL — AB (ref 8.9–10.3)
CO2: 27 mmol/L (ref 22–32)
Chloride: 100 mmol/L — ABNORMAL LOW (ref 101–111)
Creatinine, Ser: 0.96 mg/dL (ref 0.61–1.24)
GLUCOSE: 149 mg/dL — AB (ref 65–99)
Potassium: 3.9 mmol/L (ref 3.5–5.1)
Sodium: 138 mmol/L (ref 135–145)

## 2017-02-17 LAB — CBC
HCT: 45 % (ref 39.0–52.0)
Hemoglobin: 14 g/dL (ref 13.0–17.0)
MCH: 29 pg (ref 26.0–34.0)
MCHC: 31.1 g/dL (ref 30.0–36.0)
MCV: 93.4 fL (ref 78.0–100.0)
PLATELETS: 356 10*3/uL (ref 150–400)
RBC: 4.82 MIL/uL (ref 4.22–5.81)
RDW: 15.8 % — AB (ref 11.5–15.5)
WBC: 20.7 10*3/uL — AB (ref 4.0–10.5)

## 2017-02-17 MED ORDER — FLUTICASONE PROPIONATE 50 MCG/ACT NA SUSP
1.0000 | Freq: Every day | NASAL | Status: DC
  Administered 2017-02-17 – 2017-02-18 (×3): 1 via NASAL
  Filled 2017-02-17: qty 16

## 2017-02-17 MED ORDER — PREDNISONE 20 MG PO TABS
20.0000 mg | ORAL_TABLET | Freq: Every day | ORAL | Status: DC
  Administered 2017-02-18: 20 mg via ORAL
  Filled 2017-02-17: qty 1

## 2017-02-17 MED ORDER — PENTOSAN POLYSULFATE SODIUM 100 MG PO CAPS
100.0000 mg | ORAL_CAPSULE | Freq: Three times a day (TID) | ORAL | Status: DC
  Administered 2017-02-17 – 2017-02-18 (×4): 100 mg via ORAL
  Filled 2017-02-17 (×4): qty 1

## 2017-02-17 MED ORDER — CETIRIZINE HCL 10 MG PO TABS
10.0000 mg | ORAL_TABLET | Freq: Every day | ORAL | Status: DC
  Administered 2017-02-17 – 2017-02-18 (×2): 10 mg via ORAL
  Filled 2017-02-17 (×3): qty 1

## 2017-02-17 MED ORDER — OXYMETAZOLINE HCL 0.05 % NA SOLN
1.0000 | Freq: Two times a day (BID) | NASAL | Status: AC
  Administered 2017-02-17 – 2017-02-18 (×4): 1 via NASAL
  Filled 2017-02-17: qty 15

## 2017-02-17 MED ORDER — AMPICILLIN-SULBACTAM SODIUM 3 (2-1) G IJ SOLR
3.0000 g | Freq: Four times a day (QID) | INTRAMUSCULAR | Status: DC
  Administered 2017-02-17 – 2017-02-18 (×3): 3 g via INTRAVENOUS
  Filled 2017-02-17 (×6): qty 3

## 2017-02-17 MED ORDER — ENOXAPARIN SODIUM 80 MG/0.8ML ~~LOC~~ SOLN: 80.0000 mg | SUBCUTANEOUS | Status: DC

## 2017-02-17 MED ORDER — CYCLOBENZAPRINE HCL 10 MG PO TABS
10.0000 mg | ORAL_TABLET | Freq: Three times a day (TID) | ORAL | Status: DC
  Administered 2017-02-17 – 2017-02-18 (×3): 10 mg via ORAL
  Filled 2017-02-17 (×3): qty 1

## 2017-02-17 NOTE — Plan of Care (Signed)
Problem: Activity: Goal: Risk for activity intolerance will decrease Outcome: Progressing Pt able to ambulate in room without SOB today. Denies SOBOE.

## 2017-02-17 NOTE — Progress Notes (Signed)
Pharmacy note: lovenox  56 yo male on lovenox for VTE prophylaxis -SCr= 0.96, CrCl > 100, h/h stable Body mass index is 53.03 kg/m.  Plan -Due to obesity will change lovenox to 80mg  [place] daily (~ 0.5mg /kg; dose rounded down) -Please call pharmacy with any questions or concerns  .Hildred Laser, Pharm D 02/17/2017 10:28 AM

## 2017-02-17 NOTE — Progress Notes (Signed)
Pt requesting Afrin spray and Dymista that he takes at home. Pt stating nasal passage starting to get congested. Verified with pharmacy that Dymista is not offered at [facility] pharmacy. Obtained order from on-call for flonase and afrin spray. Pt thankful for assistance. Will continued to monitor. Jacqlyn Larsen, RN

## 2017-02-17 NOTE — Progress Notes (Signed)
Patient ID: Mark Harrell, male   DOB: 06/27/1960, 56 y.o.   MRN: [id number]  PROGRESS NOTE    Mark Harrell  MRN:[id number] DOB: 09/20/1960 DOA: 02/15/2017  PCP: McKeown, William, MD   Brief Narrative:  56 year old male  asthma, depression,  Hypertension, dementia   presented to ED with worsening shortness or breath at rest and exertion, productive cough of clear sputum, fevers for past 2 days PTA.  He used bronchodilator treatment at home but will little symptomatic improvement.  In ED, pt was afebrile. His O2 sat was in 80's on room air but it has improved to 90's with [place] oxygen support. CXR showed patchy right side infiltrate concerning for pneumonia. WBC count was 12.9. Lactic acid was WNL, troponin was WNL Pt was given vanco and zosyn in ED as well as duoneb and solumedrol. He was admitted for management of sepsis and pneumonia.   Assessment & Plan:  Sepsis due to pneumonia ([facility]) / Leukocytosis  - Sepsis criteria met on admission with low grade fever, tachycardia, tachypnea, hypoxia, hypotension, leukocytosis - Lactic acid is WNL - UA not collected on admission , urinalysis showed negative nitrites and leukocytes 9/24 - CXR showed patchy acute right side infiltrate concerning for pneumonia  - Blood cultures and urine culture ordered - Started broad spectrum antibiotics: vanco and cefepime -->narrowing to Unasyn 9/25 -the patient is doing much better sitting up in the chair and we will get a desats screen and transition hopefully from Unasyn to Augmentin to complete a course of antibiotics  -the patient has on multiple episodes of aspirationover the past year and I suspect that this may be secondary to some of his reflux and his morbid obesity-he is not able to use his BiPAP usually and has not used it in the past 2 months  Active Problems: Severe persistent asthma with exacerbation / Acute respiratory failure with hypoxia  OSA not on CPAP currently - Started albuterol and  atrovent every 2 hours PRN shortness of breath or wheezing - Started duoneb every 6 hours scheduled - Continue solumedrol 40 mg IV daily, transition to by mouth prednisone 40 daily - Continue oxygen support via [place] to keep O2 sats above 90% -he hasn't used CPAP in about 2 months and this is because of his recurrent aspiration, he will needfurther fitting as well as testing at pulmonology office-his pulmonologist is leaving town and will need to reschedule with another provider-I will cc Dr. Jennings on this  Steroid induced hyperglycemia - Use SSI -de-escalating Solu-Medrol to prednisone 20 mg for 3 days and then stop on 9/27  Dyslipidemia - Continue Pravachol and omega 3 supplementation   Dementia without behavioral disturbance - Continue aricept and nemanda   Depression - Continue Zoloft   Hypertension, essential Complication of hypotension on admission - BP meds on hold due to hypotension on admission - BP has normalized  Morbid obesity due to excess calories - Body mass index is 53.03 kg/m. - Counseled on diet  -patient will follow-up with [facility] weight loss program and hopefully he can obtain a gastric sleeve or other restrictive surgery in order to help him lose weight -This is his most life-threatening problem at this time  DVT prophylaxis: Lovenox subQ Code Status: full code  Family Communication: No family at the bedside Disposition Plan: not yet stable for discharge, awaiting blood and urine cx results    Consultants:   None   Procedures:   None   Antimicrobials:   Vanco   and cefepime 9/23 -->Unasyn 9/25   Subjective:  Alert pleasant oriented sitting up in bed just finished his meal Notice at the bedsidemultiple bags of sweets as well as sodas No nausea no vomiting currently He feels much better than he did 2 days ago He has no further shortness of breath   Objective: Vitals:   02/16/17 1938 02/17/17 0020 02/17/17 0625 02/17/17 0817  BP: 140/72 (!)  169/70 132/74 (!) 141/85  Pulse: 98 94 98   Resp: _0 Temp: 98.7 F (37.1 C) 98.6 F (37 C) 98.3 F (36.8 C) 98.4 F (36.9 C)  TempSrc: Oral Oral Oral Oral  SpO2: 90% 95% 98% 99%  Weight:   (!) 177.4 kg (391 lb)   Height:        Intake/Output Summary (Last 24 hours) at 02/17/17 0851 Last data filed at 02/16/17 1700  Gross per 24 hour  Intake              900 ml  Output                0 ml  Net              900 ml   Filed Weights   02/15/17 2020 02/16/17 0405 02/17/17 0625  Weight: (!) 174.6 kg (385 lb) (!) 174.6 kg (385 lb) (!) 177.4 kg (391 lb)    Examination:   EOMI NCAT Thick neck Mallampati 4 S1-S2 no murmur No rales no rhonchi Abdomen obese, exam and meaningful Lower extremitynot swollen Neurologically intact  Data Reviewed: I have personally reviewed following labs and imaging studies  CBC:  Recent Labs Lab 02/15/17 1435 02/16/17 0333 02/17/17 0655  WBC 12.9* 12.9* 20.7*  NEUTROABS  --  11.5*  --   HGB 14.2 13.7 14.0  HCT 44.1 43.3 45.0  MCV 90.9 91.9 93.4  PLT 257 283 485   Basic Metabolic Panel:  Recent Labs Lab 02/15/17 1435 02/16/17 0333 02/17/17 0655  NA 137 137 138  K 4.1 4.8 3.9  CL 99* 99* 100*  CO2 _1 GLUCOSE 121* 287* 149*  BUN _2 CREATININE 0.86 1.10 0.96  CALCIUM 9.1 8.9 8.7*   GFR: Estimated Creatinine Clearance: 142.8 mL/min (by C-G formula based on SCr of 0.96 mg/dL). Liver Function Tests:  Recent Labs Lab 02/15/17 1435 02/16/17 0333  AST 20 70*  ALT 21 18  ALKPHOS 52 45  BILITOT 1.9* 3.1*  PROT 5.7* 6.1*  ALBUMIN 3.2* 3.2*   No results for input(s): LIPASE, AMYLASE in the last 168 hours. No results for input(s): AMMONIA in the last 168 hours. Coagulation Profile:  Recent Labs Lab 02/15/17 1934  INR 1.22   Cardiac Enzymes: No results for input(s): CKTOTAL, CKMB, CKMBINDEX, TROPONINI in the last 168 hours. BNP (last 3 results) No results for input(s): PROBNP in the last 8760  hours. HbA1C: No results for input(s): HGBA1C in the last 72 hours. CBG:  Recent Labs Lab 02/16/17 1235 02/16/17 1635 02/16/17 1940 02/17/17 0020 02/17/17 0733  GLUCAP 286* 289* 297* 227* 156*   Lipid Profile: No results for input(s): CHOL, HDL, LDLCALC, TRIG, CHOLHDL, LDLDIRECT in the last 72 hours. Thyroid Function Tests: No results for input(s): TSH, T4TOTAL, FREET4, T3FREE, THYROIDAB in the last 72 hours. Anemia Panel: No results for input(s): VITAMINB12, FOLATE, FERRITIN, TIBC, IRON, RETICCTPCT in the last 72 hours. Urine analysis:  Sepsis Labs: _3 (procalcitonin:4,lacticidven:4)   MRSA PCR Screening     Status:  None   Collection Time: 02/15/17 10:30 PM  Result Value Ref Range Status   MRSA by PCR NEGATIVE NEGATIVE Final  Culture, sputum-assessment     Status: None   Collection Time: 02/16/17  1:14 AM  Result Value Ref Range Status   Specimen Description SPUTUM  Final   Special Requests Immunocompromised  Final   Sputum evaluation THIS SPECIMEN IS ACCEPTABLE FOR SPUTUM CULTURE  Final   Report Status 02/16/2017 FINAL  Final  Culture, respiratory (NON-Expectorated)     Status: None (Preliminary result)   Collection Time: 02/16/17  1:14 AM  Result Value Ref Range Status   Specimen Description SPUTUM  Final   Special Requests Immunocompromised Reflexed from [id number]  Final   Gram Stain   Final    FEW WBC PRESENT, PREDOMINANTLY PMN FEW SQUAMOUS EPITHELIAL CELLS PRESENT FEW GRAM POSITIVE COCCI IN PAIRS RARE GRAM NEGATIVE COCCOBACILLI RARE GRAM POSITIVE RODS    Culture PENDING  Incomplete   Report Status PENDING  Incomplete      Radiology Studies: Dg Chest 2 View Result Date: 02/15/2017 Views right-sided acute infiltrate. Mild left basilar infiltrate is noted as well.     Scheduled Meds: . acidophilus  1 capsule Oral BID  . alfuzosin  10 mg Oral Daily  . chlorpheniramine-HYDROcodone  5 mL Oral Q12H  . cholecalciferol  1,000 Units Oral BID  .  darifenacin  7.5 mg Oral Daily  . desmopressin  200 mcg Oral QHS  . diclofenac sodium  4 g Topical QID  . donepezil  23 mg Oral QHS  . Eluxadoline  1 tablet Oral BID  . enoxaparin   40 mg Subcutaneous Q24H  . gabapentin  1,800 mg Oral TID  . insulin aspart  0-5 Units Subcutaneous QHS  . insulin aspart  0-9 Units Subcutaneous TID WC  . lipase/protease/amylase  48,000 Units Oral TID  . loratadine  10 mg Oral Daily  . memantine  10 mg Oral BID  . methylPREDNISolo  40 mg Intravenous Q8H  . mometasone-formoterol  2 puff Inhalation BID  . montelukast  10 mg Oral QHS  . multivitamin with minerals  1 tablet Oral Daily  . omega-3 acid ethyl   1,000 mg Oral BID  . pantoprazole  40 mg Oral Daily  . pentosan polysulfate  200 mg Oral TID AC  . pravastatin  40 mg Oral q1800  . sertraline  50 mg Oral BID   Continuous Infusions: . ceFEPime (MAXIPIME) IV Stopped (02/17/17 0137)  . vancomycin 1,250 mg (02/17/17 0810)     LOS: 2 days    Time spent: 25 minutes  Greater than 50% of the time spent on counseling and coordinating the care.  Jai , MD [facility] (P) [phone]     

## 2017-02-17 NOTE — Progress Notes (Signed)
Pharmacy Antibiotic Note  Mark Harrell is a 56 y.o. male admitted on 02/15/2017 with asthma exacerbation, originally started on vancomycin and cefepime for pneumonia. Now plan to narrow to unasyn for aspiration pneumonia. WBC= 12, afeb, PCT < 0.1, LA 1.98, crcl > 100 ml/min   Plan: unasyn 3g IV Q 6 hrs F/u cultures, monitor renal function.   Height: 6' (182.9 cm) Weight: (!) 391 lb (177.4 kg) IBW/kg (Calculated) : 77.6  Temp (24hrs), Avg:98.5 F (36.9 C), Min:98.1 F (36.7 C), Max:98.9 F (37.2 C)   Recent Labs Lab 02/15/17 1435 02/15/17 1449 02/15/17 1826 02/16/17 0333 02/17/17 0655  WBC 12.9*  --   --  12.9* 20.7*  CREATININE 0.86  --   --  1.10 0.96  LATICACIDVEN  --  1.47 1.98*  --   --     Estimated Creatinine Clearance: 142.8 mL/min (by C-G formula based on SCr of 0.96 mg/dL).    Allergies  Allergen Reactions  . Bee Venom Swelling  . Ppd [Tuberculin Purified Protein Derivative] Other (See Comments)    +ppd NEG Quantferron Gold 3/13  . Fenofibrate Other (See Comments)    Back pain  . Levofloxacin Diarrhea  . Other Other (See Comments)    Some antibiotics cause diarrhea  . Verapamil Other (See Comments)    Back pain  . Claritin [Loratadine] Other (See Comments)    unknown    Antimicrobials this admission: 9/23 zosyn x1 9/23 vancomycin > 9/25 9/23 cefepime > 9/25 9/25 unasyn >>   Dose adjustments this admission:   Microbiology results: 9/24 resp- GPC/pairs, GPR 9/23 blood x2 MRSA PCR- neg  Thank you for allowing pharmacy to be a part of this patient's care.  Maryanna Shape, PharmD, BCPS  Clinical Pharmacist  Pager: [phone]   02/17/2017 5:20 PM

## 2017-02-18 ENCOUNTER — Encounter: Payer: Self-pay | Admitting: Cardiovascular Disease

## 2017-02-18 LAB — GLUCOSE, CAPILLARY
Glucose-Capillary: 96 mg/dL (ref 65–99)
Glucose-Capillary: 116 mg/dL — ABNORMAL HIGH (ref 65–99)

## 2017-02-18 MED ORDER — HYDROCOD POLST-CPM POLST ER 10-8 MG/5ML PO SUER: 5.0000 mL | Freq: Two times a day (BID) | ORAL | 0 refills | Status: DC

## 2017-02-18 MED ORDER — PREDNISONE 20 MG PO TABS: 20.0000 mg | ORAL_TABLET | Freq: Every day | ORAL | 0 refills | Status: DC

## 2017-02-18 MED ORDER — AMOXICILLIN-POT CLAVULANATE 875-125 MG PO TABS: 1.0000 | ORAL_TABLET | Freq: Two times a day (BID) | ORAL | 0 refills | Status: DC

## 2017-02-18 MED ORDER — AMOXICILLIN-POT CLAVULANATE 875-125 MG PO TABS
1.0000 | ORAL_TABLET | Freq: Two times a day (BID) | ORAL | Status: DC
  Administered 2017-02-18: 1 via ORAL
  Filled 2017-02-18: qty 1

## 2017-02-18 NOTE — Progress Notes (Signed)
SATURATION QUALIFICATIONS: (This note is used to comply with regulatory documentation for home oxygen)  Patient Saturations on Room Air at Rest = 94%  Patient Saturations on Room Air while Ambulating = 85%  Patient Saturations on 2 Liters of oxygen while Ambulating = 95%  Please briefly explain why patient needs home oxygen: oxygen levels drop with exertion

## 2017-02-18 NOTE — Discharge Summary (Signed)
Physician Discharge Summary  Mark Harrell RRN:[id number] DOB: 12/20/1960 DOA: 02/15/2017  PCP: [name], MD  Admit date: 02/15/2017 Discharge date: 02/18/2017  Time spent: 45 minutes  Recommendations for Outpatient Follow-up:  1. Please discuss with the patient his use of anastrozole and tadalafil 2. Please get labs 1 week at family care physician office be met complete metabolic panel 3. Recommend outpatient referral back to pulmonologist Dr. Ashok Cordia regarding recurrent aspiration problems--suspect there may be a component of reflux but may need further testing by Dr. Ashok Cordia or first  Discharge Diagnoses:  Principal Problem:   Sepsis due to pneumonia [facility]) Active Problems:   Hypertension   OSA and COPD overlap syndrome ([place])   DM (diabetes mellitus) ([place])   Acute asthma exacerbation   Acute respiratory failure with hypoxia ([place])   Depression   Severe persistent asthma with exacerbation   Discharge Condition: improved  Diet recommendation: hh low salt  Filed Weights   02/16/17 0405 02/17/17 0625 02/18/17 0500  Weight: (!) 174.6 kg (385 lb) (!) 177.4 kg (391 lb) (!) 179.9 kg (396 lb 9.6 oz)    History of present illness:  56 year old male  asthma, depression,  Hypertension, dementia   presented to ED with worsening shortness or breath at rest and exertion, productive cough of clear sputum, fevers for past 2 days PTA.  He used bronchodilator treatment at home but will little symptomatic improvement.  In ED, pt was afebrile. His O2 sat was in 80's on room air but it has improved to 90's with [place] oxygen support. CXR showed patchy right side infiltrate concerning for pneumonia. WBC count was 12.9. Lactic acid was WNL, troponin was WNL Pt was given vanco and zosyn in ED as well as duoneb and solumedrol. He was admitted for management of sepsis and pneumonia.   Hospital Course:  Sepsis due to pneumonia [facility]) / Leukocytosis  - Sepsis criteria met on admission with low  grade fever, tachycardia, tachypnea, hypoxia, hypotension, leukocytosis - Lactic acid is WNL - UA not collected on admission , urinalysis showed negative nitrites and leukocytes 9/24 - CXR showed patchy acute right side infiltrate concerning for pneumonia  - Blood cultures and urine culture ordered - Started broad spectrum antibiotics: vanco and cefepime -->narrowing to Unasyn 9/25-->augmentin ending on 9/30 -the patient will need oxygen on d/c home 2 l continuous -the patient has on multiple episodes of aspirationover the past year and I suspect that this may be secondary to some of his reflux and his morbid obesity-he is not able to use his BiPAP usually and has not used it in the past 2 months  Active Problems: Severe persistent asthma with exacerbation / Acute respiratory failure with hypoxia  OSA not on CPAP currently - Started albuterol and atrovent every 2 hours PRN shortness of breath or wheezing - Started duoneb every 6 hours scheduled - Continue solumedrol 40 mg IV daily, transition to by mouth prednisone 40 daily--burst given on d/c home - Continue oxygen support via [place] to keep O2 sats above 90% -he hasn't used CPAP in about 2 months and this is because of his recurrent aspiration-I will cc Dr. Creig Hines on this  Steroid induced hyperglycemia - Use SSI -de-escalating Solu-Medrol to prednisone 20 mg for 3 days and then stop on 9/27  Dyslipidemia - Continue Pravachol and omega 3 supplementation   Dementia without behavioral disturbance - Continue aricept and nemanda   Depression - Continue Zoloft   Hypertension, essential Complication of hypotension on admission -  BP meds on hold due to hypotension on admission - BP has normalized and resumed on d/c home  Morbid obesity due to excess calories - Body mass index is 53.03 kg/m. - Counseled on diet  -patient will follow-up with [facility] weight loss program and hopefully he can obtain a gastric sleeve or other  restrictive surgery in order to help him lose weight -This is his most life-threatening problem at this time    Discharge Exam: Vitals:   02/18/17 0636 02/18/17 0820  BP: (!) 142/80   Pulse: 99   Resp: 20 20  Temp:  98.1 F (36.7 C)  SpO2: 94% 98%    General: eomi ncat  Cardiovascular:  s1 s2 no m/r/g Respiratory: clear no added sound  Discharge Instructions   Discharge Instructions    Diet - low sodium heart healthy    Complete by:  As directed    Discharge instructions    Complete by:  As directed    Would suggest completion of Augmentin Would suggest small dietary changes to help you lose more weight Will need follow-up appointment with pulmonology regarding recurrent aspiration symptoms I would talk clearly with your urologist regarding your tadalafil as well as anastrozole-as these are relatively off label uses for testosterone deficiency and are not without inherent [place] as well Would suggest that you follow up with your primary care physician in about 1-2 weeks Please follow-upwith your allergist as well We have prescribed a limited amount of Tussionex   Increase activity slowly    Complete by:  As directed      Current Discharge Medication List    START taking these medications   Details  chlorpheniramine-HYDROcodone (TUSSIONEX) 10-8 MG/5ML SUER Take 5 mLs by mouth every 12 (twelve) hours. Qty: 140 mL, Refills: 0    predniSONE (DELTASONE) 20 MG tablet Take 1 tablet (20 mg total) by mouth daily before breakfast. Qty: 4 tablet, Refills: 0      CONTINUE these medications which have NOT CHANGED   Details  albuterol (PROVENTIL) (2.5 MG/3ML) 0.083% nebulizer solution Inhale 2.5 mg into the lungs every 4 (four) hours as needed. Refills: 12    alfuzosin (UROXATRAL) 10 MG 24 hr tablet Take 10 mg by mouth daily.      anastrozole (ARIMIDEX) 1 MG tablet Take 1 mg by mouth daily. Refills: 6    baclofen (LIORESAL) 10 MG tablet Take 10 mg by mouth 3 (three) times  daily as needed for muscle spasms.     bisoprolol-hydrochlorothiazide (ZIAC) 5-6.25 MG tablet TAKE 1 TABLET BY MOUTH DAILY. Qty: 90 tablet, Refills: 1    budesonide-formoterol (SYMBICORT) 160-4.5 MCG/ACT inhaler Inhale 2 puffs into the lungs 2 (two) times daily. Qty: 1 Inhaler, Refills: 5    Calcium Carbonate-Vitamin D (CALCIUM + D PO) Take 500 mg by mouth 2 (two) times daily.     cetaphil (CETAPHIL) lotion Apply topically 2 (two) times daily. Qty: 236 mL, Refills: 0    cetirizine (ZYRTEC) 10 MG tablet Take 10 mg by mouth daily.    Cholecalciferol (VITAMIN D PO) Take 10,000-20,000 Units by mouth 2 (two) times daily. 20000 in the morning and 10000 in the evening    Cinnamon 500 MG capsule Take 500 mg by mouth 2 (two) times daily.     co-enzyme Q-10 30 MG capsule Take 30 mg by mouth daily.    cyclobenzaprine (FLEXERIL) 10 MG tablet Take 10 mg by mouth 3 (three) times daily.     desmopressin (DDAVP) 0.2 MG tablet  Take 200 mcg by mouth at bedtime. Refills: 3    diclofenac (VOLTAREN) 75 MG EC tablet Take 1 tablet (75 mg total) by mouth 2 (two) times daily between meals as needed. Qty: 60 tablet, Refills: 3    diclofenac sodium (VOLTAREN) 1 % GEL Apply 4 g topically 4 (four) times daily. Qty: 100 g, Refills: 3    donepezil (ARICEPT) 23 MG TABS tablet Take 23 mg by mouth at bedtime.    DYMISTA 137-50 MCG/ACT SUSP PLACE 2 SPRAYS INTO BOTH NOSTRILS 2 (TWO) TIMES DAILY. Refills: 5    Eluxadoline (VIBERZI) 100 MG TABS Take 1 tablet by mouth 2 (two) times daily.     esomeprazole (NEXIUM) 20 MG capsule Take 20 mg by mouth daily. Refills: 1    gabapentin (NEURONTIN) 600 MG tablet Take 1,800 mg by mouth 3 (three) times daily.     guaiFENesin (MUCINEX) 600 MG 12 hr tablet Take 600 mg by mouth 2 (two) times daily as needed for cough or to loosen phlegm.     LUTEIN PO Take 1 tablet by mouth 2 (two) times daily.    memantine (NAMENDA) 10 MG tablet Take 10 mg by mouth 2 (two) times daily.      Mepolizumab (NUCALA) 100 MG SOLR Pt receives injection once a month    Methylfol-Algae-B12-Acetylcyst (CEREFOLIN NAC) 6-90.314-2-600 MG TABS Take 1 tablet by mouth daily.     Misc Natural Products (TURMERIC CURCUMIN) CAPS Take 1 capsule by mouth 2 (two) times daily.    montelukast (SINGULAIR) 10 MG tablet TAKE 1 TABLET (10 MG TOTAL) BY MOUTH DAILY. Qty: 90 tablet, Refills: 1    Multiple Vitamins-Minerals (MULTIVITAMIN WITH MINERALS) tablet Take 1 tablet by mouth daily.    Omega-3 Krill Oil 1000 MG CAPS Take 1,000 mg by mouth 2 (two) times daily.    ondansetron (ZOFRAN-ODT) 4 MG disintegrating tablet TAKE 1 TABLET (4 MG TOTAL) BY MOUTH EVERY 6 (SIX) HOURS AS NEEDED FOR NAUSEA OR VOMITING. Qty: 30 tablet, Refills: 0   Associated Diagnoses: Diarrhea of presumed infectious origin    oxyCODONE-acetaminophen (PERCOCET) 10-325 MG tablet Take 1 tablet by mouth every 4 (four) hours as needed for pain. Qty: 60 tablet, Refills: 0    oxymetazoline (AFRIN) 0.05 % nasal spray Place 1 spray into both nostrils See admin instructions. 1 SPRAY PER SIDE EACH NIGHT BEFORE DYMISTA    pentosan polysulfate (ELMIRON) 100 MG capsule Take 100 mg by mouth 3 (three) times daily before meals.     Phosphatidylserine-DHA-EPA (VAYACOG PO) Take 1 capsule by mouth 2 (two) times daily.    Potassium 99 MG TABS Take 1 tablet by mouth daily.    pravastatin (PRAVACHOL) 40 MG tablet TAKE 1 TABLET BY MOUTH AT BEDTIME Qty: 90 tablet, Refills: 2    PRESCRIPTION MEDICATION Pt receives weekly allergy shots    Probiotic Product (PROBIOTIC PO) Take 1 capsule by mouth 2 (two) times daily.    sertraline (ZOLOFT) 50 MG tablet Take 50 mg by mouth 2 (two) times daily.     sodium chloride (OCEAN) 0.65 % SOLN nasal spray Place 1 spray into both nostrils 4 (four) times daily as needed for congestion. Uses each time before other nasal sprays    tadalafil (CIALIS) 5 MG tablet Take 5 mg by mouth every evening.      triamcinolone cream (KENALOG) 0.1 % APPLY TOPICALLY 4 TIMES A DAY Qty: 30 g, Refills: 1    trospium (SANCTURA) 20 MG tablet Take 20 mg by mouth 2 (  two) times daily.  Refills: 2    ZENPEP 25000-79000 units CPEP Take 2 capsules by mouth 3 (three) times daily. Refills: 0    albuterol (PROVENTIL HFA;VENTOLIN HFA) 108 (90 Base) MCG/ACT inhaler 1 to 2 inhalations 10 to 15 minutes apart every 4 hrs to rescue Asthma Qty: 60 g, Refills: 1       Allergies  Allergen Reactions  . Bee Venom Swelling  . Ppd [Tuberculin Purified Protein Derivative] Other (See Comments)    +ppd NEG Quantferron Gold 3/13  . Fenofibrate Other (See Comments)    Back pain  . Levofloxacin Diarrhea  . Other Other (See Comments)    Some antibiotics cause diarrhea  . Verapamil Other (See Comments)    Back pain  . Claritin [Loratadine] Other (See Comments)    unknown      The results of significant diagnostics from this hospitalization (including imaging, microbiology, ancillary and laboratory) are listed below for reference.    Significant Diagnostic Studies: Dg Chest 2 View  Result Date: 02/15/2017 CLINICAL DATA:  Shortness of Breath EXAM: CHEST  2 VIEW COMPARISON:  01/04/17 FINDINGS: Cardiac shadow is within normal limits. The lungs are well aerated bilaterally. Diffuse right upper and lower lobe infiltrate is seen. The left lung demonstrates some patchy basilar changes as well. Degenerative change of thoracic spine is noted. IMPRESSION: Views right-sided acute infiltrate. Mild left basilar infiltrate is noted as well. Electronically Signed   By: Inez Catalina M.D.   On: 02/15/2017 13:18    Microbiology: Recent Results (from the past 240 hour(s))  Blood culture (routine x 2)     Status: None (Preliminary result)   Collection Time: 02/15/17  4:30 PM  Result Value Ref Range Status   Specimen Description BLOOD RIGHT HAND  Final   Special Requests   Final    BOTTLES DRAWN AEROBIC AND ANAEROBIC Blood Culture  results may not be optimal due to an excessive volume of blood received in culture bottles   Culture NO GROWTH 2 DAYS  Final   Report Status PENDING  Incomplete  Blood culture (routine x 2)     Status: None (Preliminary result)   Collection Time: 02/15/17  5:01 PM  Result Value Ref Range Status   Specimen Description BLOOD LEFT ANTECUBITAL  Final   Special Requests   Final    BOTTLES DRAWN AEROBIC AND ANAEROBIC Blood Culture adequate volume   Culture NO GROWTH 2 DAYS  Final   Report Status PENDING  Incomplete  MRSA PCR Screening     Status: None   Collection Time: 02/15/17 10:30 PM  Result Value Ref Range Status   MRSA by PCR NEGATIVE NEGATIVE Final    Comment:        The GeneXpert MRSA Assay (FDA approved for NASAL specimens only), is one component of a comprehensive MRSA colonization surveillance program. It is not intended to diagnose MRSA infection nor to guide or monitor treatment for MRSA infections.   Culture, sputum-assessment     Status: None   Collection Time: 02/16/17  1:14 AM  Result Value Ref Range Status   Specimen Description SPUTUM  Final   Special Requests Immunocompromised  Final   Sputum evaluation THIS SPECIMEN IS ACCEPTABLE FOR SPUTUM CULTURE  Final   Report Status 02/16/2017 FINAL  Final  Culture, respiratory (NON-Expectorated)     Status: None (Preliminary result)   Collection Time: 02/16/17  1:14 AM  Result Value Ref Range Status   Specimen Description SPUTUM  Final  Special Requests Immunocompromised Reflexed from [id number]  Final   Gram Stain   Final    FEW WBC PRESENT, PREDOMINANTLY PMN FEW SQUAMOUS EPITHELIAL CELLS PRESENT FEW GRAM POSITIVE COCCI IN PAIRS RARE GRAM NEGATIVE COCCOBACILLI RARE GRAM POSITIVE RODS    Culture CULTURE REINCUBATED FOR BETTER GROWTH  Final   Report Status PENDING  Incomplete     Labs: Basic Metabolic Panel:  Recent Labs Lab 02/15/17 1435 02/16/17 0333 02/17/17 0655  NA 137 137 138  K 4.1 4.8 3.9  CL 99* 99*  100*  CO2 _0 GLUCOSE 121* 287* 149*  BUN _1 CREATININE 0.86 1.10 0.96  CALCIUM 9.1 8.9 8.7*   Liver Function Tests:  Recent Labs Lab 02/15/17 1435 02/16/17 0333  AST 20 70*  ALT 21 18  ALKPHOS 52 45  BILITOT 1.9* 3.1*  PROT 5.7* 6.1*  ALBUMIN 3.2* 3.2*   No results for input(s): LIPASE, AMYLASE in the last 168 hours. No results for input(s): AMMONIA in the last 168 hours. CBC:  Recent Labs Lab 02/15/17 1435 02/16/17 0333 02/17/17 0655  WBC 12.9* 12.9* 20.7*  NEUTROABS  --  11.5*  --   HGB 14.2 13.7 14.0  HCT 44.1 43.3 45.0  MCV 90.9 91.9 93.4  PLT 257 283 356   Cardiac Enzymes: No results for input(s): CKTOTAL, CKMB, CKMBINDEX, TROPONINI in the last 168 hours. BNP: BNP (last 3 results)  Recent Labs  01/08/17 1611  BNP 173*    ProBNP (last 3 results) No results for input(s): PROBNP in the last 8760 hours.  CBG:  Recent Labs Lab 02/17/17 0733 02/17/17 1133 02/17/17 1618 02/17/17 2001 02/18/17 0754  GLUCAP 156* 191* 192* 135* 96       Signed:  Nita Sells MD   [facility] 02/18/2017, 10:08 AM

## 2017-02-18 NOTE — Care Management Note (Signed)
Case Management Note  Patient Details  Name: Mark Harrell MRN: [id number] Date of Birth: 25-Mar-1961  Subjective/Objective:   Pt presented for Sepsis due to pneumonia. PTA pt from home with support of family. Pt will need DME 02. Pt is agreeable to use [facility] for DME 02.                   Action/Plan: Referral made to [facility] with [facility]- DME will be delivered to room prior to d/c. No further needs from CM at this time.   Expected Discharge Date:  02/18/17               Expected Discharge Plan:  Home/Self Care  In-House Referral:  NA  Discharge planning Services  CM Consult  Post Acute Care Choice:  Durable Medical Equipment Choice offered to:  Patient  DME Arranged:  Oxygen DME Agency:  [place]:  NA [place] Agency:  NA  Status of Service:  Completed, signed off  If discussed at [place] of Stay Meetings, dates discussed:    Additional Comments:  Bethena Roys, RN 02/18/2017, 12:07 PM

## 2017-02-18 NOTE — Progress Notes (Signed)
Ambulated patient in hall 60 feet on room air oxygen saturations ranging 88-90 %. Patient does not complain of shortness of breath but complains of being tired and not being able to walk further.Assisted back to room and reapplied  oxygen at 2 liters nasal canula saturations 95-96%. Will continue to monitor.

## 2017-02-18 NOTE — Discharge Instructions (Signed)

## 2017-02-19 ENCOUNTER — Other Ambulatory Visit (HOSPITAL_COMMUNITY): Payer: BLUE CROSS/BLUE SHIELD

## 2017-02-19 LAB — CULTURE, RESPIRATORY W GRAM STAIN: Culture: NORMAL

## 2017-02-20 LAB — CULTURE, BLOOD (ROUTINE X 2)
CULTURE: NO GROWTH
Culture: NO GROWTH
Special Requests: ADEQUATE

## 2017-02-23 ENCOUNTER — Ambulatory Visit (HOSPITAL_COMMUNITY): Payer: BLUE CROSS/BLUE SHIELD | Attending: Internal Medicine

## 2017-02-23 ENCOUNTER — Other Ambulatory Visit: Payer: Self-pay

## 2017-02-23 ENCOUNTER — Telehealth: Payer: Self-pay | Admitting: *Deleted

## 2017-02-23 DIAGNOSIS — I119 Hypertensive heart disease without heart failure: Secondary | ICD-10-CM | POA: Insufficient documentation

## 2017-02-23 DIAGNOSIS — R0600 Dyspnea, unspecified: Secondary | ICD-10-CM | POA: Diagnosis not present

## 2017-02-23 DIAGNOSIS — E785 Hyperlipidemia, unspecified: Secondary | ICD-10-CM | POA: Diagnosis not present

## 2017-02-23 DIAGNOSIS — Z6841 Body Mass Index (BMI) 40.0 and over, adult: Secondary | ICD-10-CM | POA: Insufficient documentation

## 2017-02-23 MED ORDER — PERFLUTREN LIPID MICROSPHERE
1.0000 mL | INTRAVENOUS | Status: AC | PRN
  Administered 2017-02-23: 3 mL via INTRAVENOUS

## 2017-02-23 NOTE — Telephone Encounter (Signed)
Called to schedule a hosp fu for patient scheduled for 02/25/17

## 2017-02-25 ENCOUNTER — Ambulatory Visit (INDEPENDENT_AMBULATORY_CARE_PROVIDER_SITE_OTHER): Payer: BLUE CROSS/BLUE SHIELD | Admitting: Physician Assistant

## 2017-02-25 ENCOUNTER — Encounter: Payer: Self-pay | Admitting: Physician Assistant

## 2017-02-25 VITALS — BP 118/70 | HR 83 | Temp 97.4°F | Resp 20 | Ht 72.0 in | Wt 373.0 lb

## 2017-02-25 DIAGNOSIS — J9601 Acute respiratory failure with hypoxia: Secondary | ICD-10-CM | POA: Diagnosis not present

## 2017-02-25 DIAGNOSIS — J189 Pneumonia, unspecified organism: Secondary | ICD-10-CM | POA: Diagnosis not present

## 2017-02-25 DIAGNOSIS — Z6841 Body Mass Index (BMI) 40.0 and over, adult: Secondary | ICD-10-CM

## 2017-02-25 DIAGNOSIS — L309 Dermatitis, unspecified: Secondary | ICD-10-CM | POA: Diagnosis not present

## 2017-02-25 DIAGNOSIS — A419 Sepsis, unspecified organism: Secondary | ICD-10-CM | POA: Diagnosis not present

## 2017-02-25 DIAGNOSIS — R410 Disorientation, unspecified: Secondary | ICD-10-CM

## 2017-02-25 DIAGNOSIS — K219 Gastro-esophageal reflux disease without esophagitis: Secondary | ICD-10-CM | POA: Diagnosis not present

## 2017-02-25 MED ORDER — ESOMEPRAZOLE MAGNESIUM 40 MG PO CPDR: 40.0000 mg | DELAYED_RELEASE_CAPSULE | Freq: Every day | ORAL | 4 refills | Status: DC

## 2017-02-25 MED ORDER — HYDROCOD POLST-CPM POLST ER 10-8 MG/5ML PO SUER: 5.0000 mL | Freq: Two times a day (BID) | ORAL | 0 refills | Status: DC

## 2017-02-25 MED ORDER — CLOTRIMAZOLE-BETAMETHASONE 1-0.05 % EX CREA: 1.0000 "application " | TOPICAL_CREAM | Freq: Two times a day (BID) | CUTANEOUS | 2 refills | Status: DC

## 2017-02-25 NOTE — Patient Instructions (Addendum)
Stop the cinnamon, tumeric, voltern pills, krill oil,  arimidex (anastrozole) Will increase nexium to 40mg    Aspiration Precautions, Adult Aspiration is the breathing in (inhalation) of a liquid or object into the lungs. Things that can be inhaled into the lungs include:  Food.  Any type of liquid, such as drinks or saliva.  Stomach contents, such as vomit or stomach acid.  What are the signs of aspiration? Signs of aspiration include:  Coughing after swallowing food or liquids.  Clearing the throat often while eating.  Trouble breathing. This may include: ? Breathing quickly. ? Breathing very slowly. ? Loud breathing. ? Rumbling sounds from the lungs while breathing.  Coughing up phlegm (sputum) that: ? Is yellow, tan, or green. ? Has pieces of food in it. ? Is bad-smelling.  Having a hoarse, barky cough.  Not being able to speak.  A hoarse voice.  Drooling while eating.  A feeling of fullness in the throat or a feeling that something is stuck in the throat.  Choking often.  Having a runny noise while eating.  Coughing when lying down or having to sit up quickly after lying down.  A change in skin color. The skin may look red or blue.  Fever.  Watery eyes.  Pain in the chest or back.  A pained look on the face.  What are the complications of aspiration? Complications of aspiration include:  Losing weight because the person is not absorbing needed nutrients.  Loss of enjoyment and the social benefits of eating.  Choking.  Lung irritation, if someone aspirates acidic food or drinks.  Lung infection (pneumonia).  Collection of infected liquid (pus) in the lungs (lung abscess).  In serious cases, death can occur. What can I do to prevent aspiration? Caring for someone who has a feeding tube If you are caring for someone who has a feeding tube who cannot eat or drink safely through his or her mouth:  Keep the person in an upright position as  much as possible.  Do not lay the person flat if he or she is getting continuous feedings. If you need to lay the person flat for any reason, turn the feeding pump off.  Check feeding tube residuals as told by your health care provider. Ask your health care provider what residual amount is too high.  Caring for someone who can eat and drink safely by mouth If you are caring for someone who can eat and drink safely through his or her mouth:  Have the person sit in an upright position when eating food or drinking fluids. This can be done in two ways: ? Have the person sit up in a chair. ? If sitting in a chair is not possible, position the person in bed so he or she is upright.  Remind the person to eat slowly and chew well. Make sure the person is awake and alert while eating.  Do not distract the person. This is especially important for people with thinking or memory (cognitive) problems.  Allow foods to cool. Hot foods may be more difficult to swallow.  Provide small meals more frequently, instead of 3 large meals. This may reduce fatigue during eating.  Check the person's mouth thoroughly for leftover food after eating.  Keep the person sitting upright for 30-45 minutes after eating.  Do not serve food or drink during 2 hours or more before bedtime.  General instructions Follow these general guidelines to prevent aspiration in someone who can eat and  drink safely by mouth:  Never put food or liquids in the mouth of a person who is not fully alert.  Feed small amounts of food. Do not force feed.  For a person who is on a diet for swallowing difficulty (dysphagia diet), follow the recommended food and drink consistency. For example, in dysphagia diet level 1, thicken liquids to pudding-like consistency.  Use as little water as possible when brushing the person's teeth or cleaning his or her mouth.  Provide oral care before and after meals.  Use adaptive devices such as cut-out  cups, straws, or utensils as told by the health care provider.  Crush pills and put them in soft food such as pudding or ice cream. Some pills should not be crushed. Check with the health care provider before crushing any medicine.  Contact a health care provider if:  The person has a feeding tube, and the feeding tube residual amount is too high.  The person has a fever.  The person tries to avoid food or water, such as refusing to eat, drink, or be fed, or is eating less than normal.  The person may have aspirated food or liquid.  You notice warning signs, such as choking or coughing, when the person eats or drinks. Get help right away if:  The person has trouble breathing or starts to breathe quickly.  The person is breathing very slowly or stops breathing.  The person coughs a lot after eating or drinking.  The person has a long-lasting (chronic) cough.  The person coughs up thick, yellow, or tan sputum.  If someone is choking on food or an object, perform the Heimlich maneuver (abdominal thrusts).  The person has symptoms of pneumonia, such as: ? Coughing a lot. ? Coughing up mucus with a bad smell or blood in it. ? Feeling short of breath. ? Complaining of chest pain. ? Sweating, fever, and chills. ? Feeling tired. ? Complaining of trouble breathing. ? Wheezing.  The person cannot stop choking.  The person is unable to breathe, turns blue, faints, or seems confused. These symptoms may represent a serious problem that is an emergency. Do not wait to see if the symptoms will go away. Get medical help right away. Call your local emergency services (911 in the [place]  Summary  Aspiration is the breathing in (inhalation) of a liquid or object into the lungs. Things that can be inhaled into the lungs include food, liquids, saliva, or stomach contents.  Aspiration can cause pneumonia or choking.  One sign of aspiration is coughing after swallowing food or  liquids.  Contact a health care provider if you notice signs of aspiration. This information is not intended to replace advice given to you by your health care provider. Make sure you discuss any questions you have with your health care provider. Document Released: 06/14/2010 Document Revised: 02/07/2016 Document Reviewed: 02/07/2016 Elsevier Interactive Patient Education  2018 [employer].    Aspiration Pneumonia Aspiration pneumonia is an infection in your lungs. It occurs when food, liquid, or stomach contents (vomit) are inhaled (aspirated) into your lungs. When these things get into your lungs, swelling (inflammation) and infection can occur. This can make it difficult for you to breathe. Aspiration pneumonia is a serious condition and can be life threatening. What increases the risk? Aspiration pneumonia is more likely to occur when a person's cough (gag) reflex or ability to swallow has been decreased. Some things that can do this include:  Having a brain injury  or disease, such as stroke, seizures, Parkinson's disease, dementia, or amyotrophic lateral sclerosis (ALS).  Being given general anesthetic for procedures.  Being in a coma (unconscious).  Having a narrowing of the tube that carries food to the stomach (esophagus).  Drinking too much alcohol. If a person passes out and vomits, vomit can be swallowed into the lungs.  Taking certain medicines, such as tranquilizers or sedatives.  What are the signs or symptoms?  Coughing after swallowing food or liquids.  Breathing problems, such as wheezing or shortness of breath.  Bluish skin. This can be caused by lack of oxygen.  Coughing up food or mucus. The mucus might contain blood, greenish material, or yellowish-white fluid (pus).  Fever.  Chest pain.  Being more tired than usual (fatigue).  Sweating more than usual.  Bad breath. How is this diagnosed? A physical exam will be done. During the exam, the health  care provider will listen to your lungs with a stethoscope to check for:  Crackling sounds in the lungs.  Decreased breath sounds.  A rapid heartbeat.  Various tests may be ordered. These may include:  Chest X-ray.  CT scan.  Swallowing study. This test looks at how food is swallowed and whether it goes into your breathing tube (trachea) or food pipe (esophagus).  Sputum culture. Saliva and mucus (sputum) are collected from the lungs or the tubes that carry air to the lungs (bronchi). The sputum is then tested for bacteria.  Bronchoscopy. This test uses a flexible tube (bronchoscope) to see inside the lungs.  How is this treated? Treatment will usually include antibiotic medicines. Other medicines may also be used to reduce fever or pain. You may need to be treated in the hospital. In the hospital, your breathing will be carefully monitored. Depending on how well you are breathing, you may need to be given oxygen, or you may need breathing support from a breathing machine (ventilator). For people who fail a swallowing study, a feeding tube might be placed in the stomach, or they may be asked to avoid certain food textures or liquids when they eat. Follow these instructions at home:  Carefully follow any special eating instructions you were given, such as avoiding certain food textures or thickening liquids. This reduces the risk of developing aspiration pneumonia again.  Only take over-the-counter or prescription medicines as directed by your health care provider. Follow the directions carefully.  If you were prescribed antibiotics, take them as directed. Finish them even if you start to feel better.  Rest as instructed by your health care provider.  Keep all follow-up appointments with your health care provider. Contact a health care provider if:  You develop worsening shortness of breath, wheezing, or difficulty breathing.  You develop a fever.  You have chest pain. This  information is not intended to replace advice given to you by your health care provider. Make sure you discuss any questions you have with your health care provider. Document Released: 03/09/2009 Document Revised: 10/24/2015 Document Reviewed: 10/28/2012 Elsevier Interactive Patient Education  2017 [employer].

## 2017-02-25 NOTE — Progress Notes (Signed)
Hospital follow up  Assessment and Plan:  Sepsis due to pneumonia [facility]) -     Ambulatory referral to Pulmonology -     CBC with Differential/Platelet -     BASIC METABOLIC PANEL WITH GFR -     Hepatic function panel - recheck labs  Acute respiratory failure with hypoxia ([place]) -     Ambulatory referral to Pulmonology -     CBC with Differential/Platelet -     BASIC METABOLIC PANEL WITH GFR -     Hepatic function panel - will continue 2 L 02 with walking but patient is at 93-95 % off while sitting - follow up Dr. Ashok Cordia - negative CTA chest in August - LONG discussion about weight loss - has seen cardio recently with normal Echo 02/2017  Gastroesophageal reflux disease without esophagitis -     Ambulatory referral to Gastroenterology -     esomeprazole (NEXIUM) 40 MG capsule; Take 1 capsule (40 mg total) by mouth daily. - stopped several medications that may be contributing to GERD including diclofenac  Dermatitis -     clotrimazole-betamethasone (LOTRISONE) cream; Apply 1 application topically 2 (two) times daily.  Confusion -     Ammonia - check for CO2 narcosis, need to try to get back on bipap, cut back on O2 - ? Polypharmacy, he is not willing to get off medicaitons  Other orders -     chlorpheniramine-HYDROcodone (TUSSIONEX) 10-8 MG/5ML SUER; Take 5 mLs by mouth every 12 (twelve) hours. (states only thing that works, will give low dose but did express concern polypharmacy is contributing to aspiration.   BMI 47.46 Morbid Obesity with co morbidities - long discussion about weight loss, diet, and exercise    Hospital discharge meds were reviewed, and reconciled with the patient.   Medications Discontinued During This Encounter  Medication Reason  . amoxicillin-clavulanate (AUGMENTIN) 875-125 MG tablet Completed Course  . predniSONE (DELTASONE) 20 MG tablet Completed Course  . Misc Natural Products (TURMERIC CURCUMIN) CAPS   . anastrozole (ARIMIDEX) 1 MG tablet    . Cinnamon 500 MG capsule   . diclofenac (VOLTAREN) 75 MG EC tablet   . Omega-3 Krill Oil 1000 MG CAPS   . esomeprazole (NEXIUM) 20 MG capsule Reorder  . chlorpheniramine-HYDROcodone ([place]) 10-8 MG/5ML SUER Reorder    Over 40 minutes of exam, counseling, chart review, and complex, high/moderate level critical decision making was performed this visit.  Future Appointments Date Time Provider [place]  03/11/2017 3:00 PM Mcarthur Rossetti, MD [facility] None  03/12/2017 3:00 PM [facility] NURSE [name]  04/14/2017 3:30 PM [name], MD [name]  04/23/2017 3:45 PM [name], MD [name]  05/12/2017 2:15 PM Nimmons, Max T, DPM [name]  11/05/2017 3:00 PM [name], MD [name]      HPI 56 y.o.male with significant medical history, morbidly obese, and polypharmacy presents for follow up for transition from recent hospitalization. Admit date to the hospital was 02/15/17, patient was discharged from the hospital on 02/18/17 and our clinical staff contacted the office the day after discharge to set up a follow up appointment, patient was admitted for:   Sepsis due to pneumonia thought to be from recurrent aspiration with possible reflux component.  He has had 4 admission this past year, 2 in Jan and one in August for a similar issues and this most recent.  Finished augmentin on 09/30.  He has not been using his BiPAP x 2 months, due to  fear of aspirating, however he is complaining of "falling asleep randomly or waking up".  He is on oxygen from the hospital, 3 L constantly, sitting here it is 98% on 3 L and off sitting it is dropping down to 92- 95. With movement he will drop down to 78 percent and it improves with 2L O2, he will go up to 92%, will reevaluate at Dr. Ashok Cordia.  We had a long discussion about polypharmacy and how some of his medications can be contributing to his aspirations, we will try to remove some but the patient  is very hesitant and is requesting different medications to supplement.  CTA negative 01/03/2017   Past Medical History:  Diagnosis Date  . Anesthesia complication requiring reversal agent administration    ? from central apnea, very difficult to get off vent  . Anxiety   . Arthritis    osteo  . Asthma   . BPH (benign prostatic hyperplasia)   . Complication of anesthesia    difficulty waking , they twlight me because of my respiratory problems "  . Dyspnea   . Enlarged heart   . Family history of adverse reaction to anesthesia    mother trouble waking up, and heart stopped  . GERD (gastroesophageal reflux disease)   . Headache    botox injections for headaches  . Hyperlipidemia   . Hypertension   . Hypogonadism male   . IBS (irritable bowel syndrome)   . Obesity   . OSA (obstructive sleep apnea)    cpap  . Pneumonia   . Pre-diabetes   . Prostatitis      Allergies  Allergen Reactions  . Bee Venom Swelling  . Ppd [Tuberculin Purified Protein Derivative] Other (See Comments)    +ppd NEG Quantferron Gold 3/13  . Fenofibrate Other (See Comments)    Back pain  . Levofloxacin Diarrhea  . Other Other (See Comments)    Some antibiotics cause diarrhea  . Verapamil Other (See Comments)    Back pain  . Claritin [Loratadine] Other (See Comments)    unknown      Current Outpatient Prescriptions on File Prior to Visit  Medication Sig Dispense Refill  . albuterol (PROVENTIL HFA;VENTOLIN HFA) 108 (90 Base) MCG/ACT inhaler 1 to 2 inhalations 10 to 15 minutes apart every 4 hrs to rescue Asthma 60 g 1  . albuterol (PROVENTIL) (2.5 MG/3ML) 0.083% nebulizer solution Inhale 2.5 mg into the lungs every 4 (four) hours as needed.  12  . alfuzosin (UROXATRAL) 10 MG 24 hr tablet Take 10 mg by mouth daily.      . baclofen (LIORESAL) 10 MG tablet Take 10 mg by mouth 3 (three) times daily as needed for muscle spasms.     . bisoprolol-hydrochlorothiazide (ZIAC) 5-6.25 MG tablet TAKE 1 TABLET  BY MOUTH DAILY. 90 tablet 1  . budesonide-formoterol (SYMBICORT) 160-4.5 MCG/ACT inhaler Inhale 2 puffs into the lungs 2 (two) times daily. 1 Inhaler 5  . Calcium Carbonate-Vitamin D (CALCIUM + D PO) Take 500 mg by mouth 2 (two) times daily.     . cetaphil (CETAPHIL) lotion Apply topically 2 (two) times daily. 236 mL 0  . cetirizine (ZYRTEC) 10 MG tablet Take 10 mg by mouth daily.    . Cholecalciferol (VITAMIN D PO) Take 10,000-20,000 Units by mouth 2 (two) times daily. 20000 in the morning and 10000 in the evening    . co-enzyme Q-10 30 MG capsule Take 30 mg by mouth daily.    . cyclobenzaprine (  FLEXERIL) 10 MG tablet Take 10 mg by mouth 3 (three) times daily.     [name] desmopressin (DDAVP) 0.2 MG tablet Take 200 mcg by mouth at bedtime.  3  . diclofenac sodium (VOLTAREN) 1 % GEL Apply 4 g topically 4 (four) times daily. 100 g 3  . donepezil (ARICEPT) 23 MG TABS tablet Take 23 mg by mouth at bedtime.    . DYMISTA 137-50 MCG/ACT SUSP PLACE 2 SPRAYS INTO BOTH NOSTRILS 2 (TWO) TIMES DAILY.  5  . Eluxadoline (VIBERZI) 100 MG TABS Take 1 tablet by mouth 2 (two) times daily.     [name] gabapentin (NEURONTIN) 600 MG tablet Take 1,800 mg by mouth 3 (three) times daily.     [name] guaiFENesin (MUCINEX) 600 MG 12 hr tablet Take 600 mg by mouth 2 (two) times daily as needed for cough or to loosen phlegm.     . LUTEIN PO Take 1 tablet by mouth 2 (two) times daily.    . memantine (NAMENDA) 10 MG tablet Take 10 mg by mouth 2 (two) times daily.     . Mepolizumab (NUCALA) 100 MG SOLR Pt receives injection once a month    . Methylfol-Algae-B12-Acetylcyst (CEREFOLIN NAC) 6-90.314-2-600 MG TABS Take 1 tablet by mouth daily.     . montelukast (SINGULAIR) 10 MG tablet TAKE 1 TABLET (10 MG TOTAL) BY MOUTH DAILY. 90 tablet 1  . Multiple Vitamins-Minerals (MULTIVITAMIN WITH MINERALS) tablet Take 1 tablet by mouth daily.    . ondansetron (ZOFRAN-ODT) 4 MG disintegrating tablet TAKE 1 TABLET (4 MG TOTAL) BY MOUTH EVERY 6 (SIX) HOURS  AS NEEDED FOR NAUSEA OR VOMITING. 30 tablet 0  . oxyCODONE-acetaminophen (PERCOCET) 10-325 MG tablet Take 1 tablet by mouth every 4 (four) hours as needed for pain. 60 tablet 0  . oxymetazoline (AFRIN) 0.05 % nasal spray Place 1 spray into both nostrils See admin instructions. 1 SPRAY PER SIDE EACH NIGHT BEFORE DYMISTA    . pentosan polysulfate (ELMIRON) 100 MG capsule Take 100 mg by mouth 3 (three) times daily before meals.     . Phosphatidylserine-DHA-EPA (VAYACOG PO) Take 1 capsule by mouth 2 (two) times daily.    . Potassium 99 MG TABS Take 1 tablet by mouth daily.    . pravastatin (PRAVACHOL) 40 MG tablet TAKE 1 TABLET BY MOUTH AT BEDTIME 90 tablet 2  . PRESCRIPTION MEDICATION Pt receives weekly allergy shots    . Probiotic Product (PROBIOTIC PO) Take 1 capsule by mouth 2 (two) times daily.    . sertraline (ZOLOFT) 50 MG tablet Take 50 mg by mouth 2 (two) times daily.     . sodium chloride (OCEAN) 0.65 % SOLN nasal spray Place 1 spray into both nostrils 4 (four) times daily as needed for congestion. Uses each time before other nasal sprays    . tadalafil (CIALIS) 5 MG tablet Take 5 mg by mouth every evening.     . triamcinolone cream (KENALOG) 0.1 % APPLY TOPICALLY 4 TIMES A DAY (Patient taking differently: APPLY TOPICALLY 2 TIMES A DAY AS NEEDED for itching) 30 g 1  . trospium (SANCTURA) 20 MG tablet Take 20 mg by mouth 2 (two) times daily.   2  . ZENPEP 25000-79000 units CPEP Take 2 capsules by mouth 3 (three) times daily.  0   Current Facility-Administered Medications on File Prior to Visit  Medication Dose Route Frequency Provider Last Rate Last Dose  . Mepolizumab SOLR 100 mg  100 mg Subcutaneous Q28 days [name],  MD   100 mg at 02/12/17 1114    ROS: all negative except above.   Physical Exam: Filed Weights   02/25/17 1555  Weight: (!) 373 lb (169.2 kg)   BP 118/70   Pulse 83   Temp (!) 97.4 F (36.3 C)   Resp 20   Ht 6' (1.829 m)   Wt (!) 373 lb (169.2 kg)    SpO2 97%   BMI 50.59 kg/m  General Appearance: Well nourished, in no apparent distress. Eyes: PERRLA, EOMs, conjunctiva no swelling or erythema Sinuses: No Frontal/maxillary tenderness ENT/Mouth: Ext aud canals clear, TMs without erythema, bulging. No erythema, swelling, or exudate on post pharynx.  Tonsils not swollen or erythematous. Hearing normal.  Neck: Supple, thyroid normal.  Respiratory: Respiratory effort normal, BS equal bilaterally, patient on [place] 3 L, without rales, rhonchi, wheezing or stridor.  Cardio: RRR with no MRGs, distant heart sounds due to body habitus. Brisk peripheral pulses without edema.  Abdomen: Soft, + BS, morbidly obese, Non tender, no guarding, rebound, hernias, masses. Lymphatics: Non tender without lymphadenopathy.  Musculoskeletal: Full ROM, 5/5 strength, antalgic gait, difficult to stand due to weight Skin: Warm, dry without rashes, lesions, ecchymosis.  Neuro: Cranial nerves intact. No cerebellar symptoms. Psych: Awake and oriented X 3, normal affect, Insight and Judgment appropriate.      Vicie Mutters, PA-C 4:37 PM [facility]

## 2017-02-26 LAB — HEPATIC FUNCTION PANEL
AG Ratio: 1.7 (calc) (ref 1.0–2.5)
ALBUMIN MSPROF: 3.5 g/dL — AB (ref 3.6–5.1)
ALT: 30 U/L (ref 9–46)
AST: 17 U/L (ref 10–35)
Alkaline phosphatase (APISO): 68 U/L (ref 40–115)
Bilirubin, Direct: 0.2 mg/dL (ref 0.0–0.2)
Globulin: 2.1 g/dL (calc) (ref 1.9–3.7)
Indirect Bilirubin: 0.7 mg/dL (calc) (ref 0.2–1.2)
TOTAL PROTEIN: 5.6 g/dL — AB (ref 6.1–8.1)
Total Bilirubin: 0.9 mg/dL (ref 0.2–1.2)

## 2017-02-26 LAB — CBC WITH DIFFERENTIAL/PLATELET
BASOS ABS: 93 {cells}/uL (ref 0–200)
Basophils Relative: 0.7 %
EOS ABS: 160 {cells}/uL (ref 15–500)
Eosinophils Relative: 1.2 %
HEMATOCRIT: 43.4 % (ref 38.5–50.0)
HEMOGLOBIN: 14 g/dL (ref 13.2–17.1)
LYMPHS ABS: 4083 {cells}/uL — AB (ref 850–3900)
MCH: 28.5 pg (ref 27.0–33.0)
MCHC: 32.3 g/dL (ref 32.0–36.0)
MCV: 88.2 fL (ref 80.0–100.0)
MPV: 10 fL (ref 7.5–12.5)
Monocytes Relative: 9.2 %
NEUTROS ABS: 7741 {cells}/uL (ref 1500–7800)
Neutrophils Relative %: 58.2 %
Platelets: 331 10*3/uL (ref 140–400)
RBC: 4.92 10*6/uL (ref 4.20–5.80)
RDW: 13.1 % (ref 11.0–15.0)
Total Lymphocyte: 30.7 %
WBC mixed population: 1224 cells/uL — ABNORMAL HIGH (ref 200–950)
WBC: 13.3 10*3/uL — ABNORMAL HIGH (ref 3.8–10.8)

## 2017-02-26 LAB — AMMONIA: Ammonia: 45 umol/L (ref <=72)

## 2017-02-26 LAB — BASIC METABOLIC PANEL WITH GFR
BUN: 16 mg/dL (ref 7–25)
CALCIUM: 8.7 mg/dL (ref 8.6–10.3)
CHLORIDE: 100 mmol/L (ref 98–110)
CO2: 34 mmol/L — AB (ref 20–32)
Creat: 1.01 mg/dL (ref 0.70–1.33)
GFR, EST AFRICAN AMERICAN: 96 mL/min/{1.73_m2} (ref >=60)
GFR, Est Non African American: 83 mL/min/{1.73_m2} (ref >=60)
GLUCOSE: 79 mg/dL (ref 65–99)
POTASSIUM: 4.4 mmol/L (ref 3.5–5.3)
Sodium: 139 mmol/L (ref 135–146)

## 2017-03-05 DIAGNOSIS — G4733 Obstructive sleep apnea (adult) (pediatric): Secondary | ICD-10-CM | POA: Diagnosis not present

## 2017-03-05 NOTE — Addendum Note (Signed)
Addendum  created 03/05/17 1501 by Audry Pili, MD   Sign clinical note

## 2017-03-06 ENCOUNTER — Telehealth: Payer: Self-pay | Admitting: Allergy & Immunology

## 2017-03-06 DIAGNOSIS — B999 Unspecified infectious disease: Secondary | ICD-10-CM

## 2017-03-06 NOTE — Telephone Encounter (Signed)
I called Mark Harrell to let him know that we are going to be ordering some additional labs to see if we can garner some more evidence to justify starting immunoglobulin supplementation. He was recently admitted again for sepsis, thought to be secondary to aspiration pneumonia, therefore I think it is worth it again to try to get immunoglobulin replacement approved. Certainly a swallowing study would also be warranted, but I will defer to Dr. Ashok Cordia to determine whether this is necessary.   Left voicemail with the information.  [name], MD [facility]

## 2017-03-09 ENCOUNTER — Ambulatory Visit (INDEPENDENT_AMBULATORY_CARE_PROVIDER_SITE_OTHER): Payer: BLUE CROSS/BLUE SHIELD | Admitting: Pulmonary Disease

## 2017-03-09 VITALS — BP 134/74 | HR 86 | Ht 72.0 in | Wt 376.1 lb

## 2017-03-09 DIAGNOSIS — J449 Chronic obstructive pulmonary disease, unspecified: Secondary | ICD-10-CM

## 2017-03-09 DIAGNOSIS — Z23 Encounter for immunization: Secondary | ICD-10-CM | POA: Diagnosis not present

## 2017-03-09 DIAGNOSIS — J189 Pneumonia, unspecified organism: Secondary | ICD-10-CM | POA: Insufficient documentation

## 2017-03-09 DIAGNOSIS — J9601 Acute respiratory failure with hypoxia: Secondary | ICD-10-CM | POA: Diagnosis not present

## 2017-03-09 NOTE — Patient Instructions (Addendum)
   Continue using your nebulizer, inhalers, and medications as prescribed.  Try shaving off your beard to get a better seal on your mask.  I will see you back in 3 weeks but call if you have any questions or concerns before then.   TESTS ORDERED: 1. CXR PA/LAT at next appointment

## 2017-03-09 NOTE — Progress Notes (Signed)
Subjective:    Patient ID: Mark Harrell, male    DOB: [birth year], 56 y.o.   MRN: [id number]  C.C.:  Follow-up after Pneumonia with Acute Hypoxic Respiratory Failure and known COPD Mixed Type, OSA, GERD, & Chronic Allergic Rhinitis.   HPI Pneumonia: Patient hospitalized on 9/23 & discharge on 9/26 with an acute right sided opacity concerning for pneumonia. Initially patient was on broad-spectrum antibiotic coverage with vancomycin and cefepime ultimately tapered to Unasyn and finally to Augmentin.  Acute hypoxic respiratory failure: Patient discharged on 2 L/m by nasal cannula. He has been using oxygen periodically and mostly with exertion.   Mixed type COPD: Previously prescribed Symbicort and Singulair. He is also now on Nucala. He continues to cough and is producing a "chunky" yellow-gray mucus at times. Mild wheezing. He has used his rescue medication a couple of times as well since his discharge.   OSA: At last appointment patient was going to utilize a chin strap and heated humidification to improve tolerance of CPAP. He hasn't been using his CPAP frequently. He isn't able to use the chin strap due to sinus congestion as the night goes on resulting in congestion & inability to breathe through his nose.   GERD: Follows with GI. Previously transitioned from Pepcid to Nexium. Patient reports he has had some periods of reflux since his last appointment. He reports he has had intermittent periods of reflux since last appointment. His Nexium was doubled last week.   Chronic allergic rhinitis: Previous regimen included Singulair and Zyrtec. At last appointment patient was using nasal saline rinse, Dymista, and intermittent Afrin. Followed by allergy/immunology (Dr. [name]). Previously planned to start Nucala. He still has sinus congestion that results in his breathing through the mouth in the morning. He has started [place].   Review of Systems  He denies any fever, chills, or sweats. No chest  pain, pressure, or tightness. No new rashes or bruising.   Allergies  Allergen Reactions  . Bee Venom Swelling  . Ppd [Tuberculin Purified Protein Derivative] Other (See Comments)    +ppd NEG Quantferron Gold 3/13  . Fenofibrate Other (See Comments)    Back pain  . Levofloxacin Diarrhea  . Other Other (See Comments)    Some antibiotics cause diarrhea  . Verapamil Other (See Comments)    Back pain  . Claritin [Loratadine] Other (See Comments)    unknown    Current Outpatient Prescriptions on File Prior to Visit  Medication Sig Dispense Refill  . albuterol (PROVENTIL HFA;VENTOLIN HFA) 108 (90 Base) MCG/ACT inhaler 1 to 2 inhalations 10 to 15 minutes apart every 4 hrs to rescue Asthma 60 g 1  . albuterol (PROVENTIL) (2.5 MG/3ML) 0.083% nebulizer solution Inhale 2.5 mg into the lungs every 4 (four) hours as needed.  12  . alfuzosin (UROXATRAL) 10 MG 24 hr tablet Take 10 mg by mouth daily.      . baclofen (LIORESAL) 10 MG tablet Take 10 mg by mouth 3 (three) times daily as needed for muscle spasms.     . bisoprolol-hydrochlorothiazide (ZIAC) 5-6.25 MG tablet TAKE 1 TABLET BY MOUTH DAILY. 90 tablet 1  . budesonide-formoterol (SYMBICORT) 160-4.5 MCG/ACT inhaler Inhale 2 puffs into the lungs 2 (two) times daily. 1 Inhaler 5  . Calcium Carbonate-Vitamin D (CALCIUM + D PO) Take 500 mg by mouth 2 (two) times daily.     . cetaphil (CETAPHIL) lotion Apply topically 2 (two) times daily. 236 mL 0  . cetirizine (ZYRTEC) 10 MG tablet Take  10 mg by mouth daily.    . chlorpheniramine-HYDROcodone (TUSSIONEX) 10-8 MG/5ML SUER Take 5 mLs by mouth every 12 (twelve) hours. 140 mL 0  . Cholecalciferol (VITAMIN D PO) Take 10,000-20,000 Units by mouth 2 (two) times daily. 20000 in the morning and 10000 in the evening    . clotrimazole-betamethasone (LOTRISONE) cream Apply 1 application topically 2 (two) times daily. 15 g 2  . co-enzyme Q-10 30 MG capsule Take 30 mg by mouth daily.    . cyclobenzaprine  (FLEXERIL) 10 MG tablet Take 10 mg by mouth 3 (three) times daily.     [name] desmopressin (DDAVP) 0.2 MG tablet Take 200 mcg by mouth at bedtime.  3  . diclofenac sodium (VOLTAREN) 1 % GEL Apply 4 g topically 4 (four) times daily. 100 g 3  . donepezil (ARICEPT) 23 MG TABS tablet Take 23 mg by mouth at bedtime.    . DYMISTA 137-50 MCG/ACT SUSP PLACE 2 SPRAYS INTO BOTH NOSTRILS 2 (TWO) TIMES DAILY.  5  . Eluxadoline (VIBERZI) 100 MG TABS Take 1 tablet by mouth 2 (two) times daily.     [name] esomeprazole (NEXIUM) 40 MG capsule Take 1 capsule (40 mg total) by mouth daily. 30 capsule 4  . gabapentin (NEURONTIN) 600 MG tablet Take 1,800 mg by mouth 3 (three) times daily.     [name] guaiFENesin (MUCINEX) 600 MG 12 hr tablet Take 600 mg by mouth 2 (two) times daily as needed for cough or to loosen phlegm.     . LUTEIN PO Take 1 tablet by mouth 2 (two) times daily.    . memantine (NAMENDA) 10 MG tablet Take 10 mg by mouth 2 (two) times daily.     . Mepolizumab (NUCALA) 100 MG SOLR Pt receives injection once a month    . Methylfol-Algae-B12-Acetylcyst (CEREFOLIN NAC) 6-90.314-2-600 MG TABS Take 1 tablet by mouth daily.     . montelukast (SINGULAIR) 10 MG tablet TAKE 1 TABLET (10 MG TOTAL) BY MOUTH DAILY. 90 tablet 1  . Multiple Vitamins-Minerals (MULTIVITAMIN WITH MINERALS) tablet Take 1 tablet by mouth daily.    . ondansetron (ZOFRAN-ODT) 4 MG disintegrating tablet TAKE 1 TABLET (4 MG TOTAL) BY MOUTH EVERY 6 (SIX) HOURS AS NEEDED FOR NAUSEA OR VOMITING. 30 tablet 0  . oxyCODONE-acetaminophen (PERCOCET) 10-325 MG tablet Take 1 tablet by mouth every 4 (four) hours as needed for pain. 60 tablet 0  . oxymetazoline (AFRIN) 0.05 % nasal spray Place 1 spray into both nostrils See admin instructions. 1 SPRAY PER SIDE EACH NIGHT BEFORE DYMISTA    . pentosan polysulfate (ELMIRON) 100 MG capsule Take 100 mg by mouth 3 (three) times daily before meals.     . Phosphatidylserine-DHA-EPA (VAYACOG PO) Take 1 capsule by mouth 2 (two)  times daily.    . Potassium 99 MG TABS Take 1 tablet by mouth daily.    . pravastatin (PRAVACHOL) 40 MG tablet TAKE 1 TABLET BY MOUTH AT BEDTIME 90 tablet 2  . PRESCRIPTION MEDICATION Pt receives weekly allergy shots    . Probiotic Product (PROBIOTIC PO) Take 1 capsule by mouth 2 (two) times daily.    . sertraline (ZOLOFT) 50 MG tablet Take 50 mg by mouth 2 (two) times daily.     . sodium chloride (OCEAN) 0.65 % SOLN nasal spray Place 1 spray into both nostrils 4 (four) times daily as needed for congestion. Uses each time before other nasal sprays    . tadalafil (CIALIS) 5 MG tablet Take 5 mg by  mouth every evening.     . triamcinolone cream (KENALOG) 0.1 % APPLY TOPICALLY 4 TIMES A DAY (Patient taking differently: APPLY TOPICALLY 2 TIMES A DAY AS NEEDED for itching) 30 g 1  . trospium (SANCTURA) 20 MG tablet Take 20 mg by mouth 2 (two) times daily.   2  . ZENPEP 25000-79000 units CPEP Take 2 capsules by mouth 3 (three) times daily.  0   Current Facility-Administered Medications on File Prior to Visit  Medication Dose Route Frequency Provider Last Rate Last Dose  . Mepolizumab SOLR 100 mg  100 mg Subcutaneous Q28 days [name], MD   100 mg at 02/12/17 1114    Past Medical History:  Diagnosis Date  . Anesthesia complication requiring reversal agent administration    ? from central apnea, very difficult to get off vent  . Anxiety   . Arthritis    osteo  . Asthma   . BPH (benign prostatic hyperplasia)   . Complication of anesthesia    difficulty waking , they twlight me because of my respiratory problems "  . Dyspnea   . Enlarged heart   . Family history of adverse reaction to anesthesia    mother trouble waking up, and heart stopped  . GERD (gastroesophageal reflux disease)   . Headache    botox injections for headaches  . Hyperlipidemia   . Hypertension   . Hypogonadism male   . IBS (irritable bowel syndrome)   . Obesity   . OSA (obstructive sleep apnea)    cpap    . Pneumonia   . Pre-diabetes   . Prostatitis     Past Surgical History:  Procedure Laterality Date  . ABDOMINAL SURGERY    . ANKLE FRACTURE SURGERY Right   . CYSTOSCOPY     [place]  . KNEE ARTHROSCOPY WITH MEDIAL MENISECTOMY Left 01/02/2017   Procedure: LEFT KNEE ARTHROSCOPY WITH PARTIAL MEDIAL MENISCECTOMY;  Surgeon: Mcarthur Rossetti, MD;  Location: [facility];  Service: Orthopedics;  Laterality: Left;  . TONSILLECTOMY    . [place] RESECTION  2007  . UVULOPALATOPHARYNGOPLASTY      Family History  Problem Relation Age of Onset  . Diabetes Paternal Uncle   . Cancer Father        lymphoma, colon  . Diabetes Maternal Grandmother   . Heart disease Maternal Grandfather   . Diabetes Maternal Grandfather   . Diabetes Paternal Grandmother   . Diabetes Paternal Grandfather   . Dementia Mother   . Prostate cancer Maternal Uncle   . Lung disease Neg Hx   . Rheumatologic disease Neg Hx     Social History   Social History  . Marital status: Single    Spouse name: N/A  . Number of children: N/A  . Years of education: N/A   Occupational History  . Dance movement psychotherapist    Social History Main Topics  . Smoking status: Never Smoker  . Smokeless tobacco: Never Used     Comment: significant second-hand exposure through mother  . Alcohol use 0.6 oz/week    1 Glasses of wine per week     Comment: 2 x a year  . Drug use: No  . Sexual activity: No   Other Topics Concern  . Not on file   Social History Narrative   [facility]:   Originally from [place]. Previously has lived in [place]. He has lived in [place], [place], & [place]. He has worked in Engineer, production. No pets  currently. Brief exposure to a roommates bird Secretary/administrator) in college. No mold, asbestos, or hot tub exposure.       Objective:   Physical Exam BP 134/74 (BP Location: Left Arm, Cuff Size: Large)   Pulse 86   Ht 6' (1.829 m)   Wt (!) 376 lb 2 oz (170.6 kg)   SpO2 97%   BMI 51.01  kg/m   General:  Awake. Alert. No distress. Morbidly obese. Integument:  Warm. Dry. No rash. Extremities:  No cyanosis or clubbing.  HEENT:  Mild bilateral nasal turbinate swelling. No oral ulcers. Tacky mucous membranes. Cardiovascular:  Regular rate. Trace edema. Unable to appreciate JVD.  Pulmonary:  Normal work of breathing on room air. Good aeration bilaterally. Abdomen: Soft. Normal bowel sounds. Protuberant. Musculoskeletal:  Normal bulk and tone. No joint deformity or effusion appreciated. Neurological:  Cranial nerves 2-12 grossly in tact. No meningismus. Moving all 4 extremities equally.   PFT 04/21/16: FVC 3.47 L (66%) FEV1 2.58 L (64%) FEV1/FVC 0.74 FEF 25-75 1.95 L (57%) negative bronchodilator response TLC 6.21 L (84%) RV 96% ERV 15% DLCO uncorrected 95% 03/28/16: FVC 4.03 L (80%) FEV1 2.80 L (69%) FEV1/FVC 0.69 FEF 25-75 1.62 L (40%) negative bronchodilator response  6MWT 03/09/17:  Walked 2 laps / Baseline Sat 97% on RA / Nadir Sat 97% on RA (walked at a slow pace with a cane - stopped due to dyspnea) 05/08/16:  Walked 321 meters / Baseline Sat 93% on RA / Nadir Sat 93% on RA @ rest  COMPLIANCE DOWNLOAD 06/03 - 11/24/16:  7% compliance with >/= 4 hours of use. Used 5 days in last 30. Residual AHI 3.2 events/hr. Maximum pressure 12.8 cm H2O.   SPLIT NIGHT SLEEP STUDY (05/07/16): Severe obstructive sleep apnea with AHI 65.7 events/hour. No significant central sleep apnea occurred. Severe desaturation during diagnostic portion to 72% on room air. No cardiac abnormalities or periodic limb movements were noted. Optimal CPAP pressure was 18 cm H2O with a large-sized [employer] Full Face Mask Simplus mask. Reading physician recommended AutoPap at 10-18 cm H2O pressure.  IMAGING CXR PA/LAT 02/15/17 (personally reviewed by me):  No pleural effusion appreciated. Right upper and lower lobe opacities that are patchy and appear alveolar. No obvious air bronchograms to suggest  consolidation. Heart normal in size & mediastinum normal in contour.  ESOPHAGRAM/BARIUM SWALLOW 04/15/16 (per radiologist): Mild nonspecific esophageal dysmotility disorder. Episode of flash laryngeal penetration with swallowing. Small sliding type I hiatal hernia. No visible mass, erosion, or ulcerations.  CXR PA/LAT 02/13/16 (previously reviewed by me): No focal opacity or effusion appreciated. No mass appreciated. Heart borderline normal in size. Mediastinum normal in contour. Low lung volumes.  MICROBIOLOGY Sputum Culture 02/16/17:  Normal [place]   LABS 04/10/16 Alpha-1 antitrypsin: MM (135)  03/28/16 CBC: 11.8/14.2/43.0/309 Eosinophils:  354 IgG: 583 IgA: 154 IgM: 46 IgE: 92  11/02/13 ANA:  Negative DS DNA Ab:  <1 SSA:  <1.0 SSB:  <1.0  02/24/11 ACE:  35 RF:  <10    Assessment & Plan:  56 y.o. male recently hospitalized with right-sided pneumonia with acute hypoxic respiratory failure. Patient's oxygen requirement has resolved on his walk test today. I do feel some of his problems are centered around lack of adequate treatment of his underlying sleep apnea. He is himself reporting air leaks from his mask likely related to his facial hair. He seems to be recovering well from his recent pneumonia. Additionally, his reflux is better controlled on the increased dose of Nexium.  I instructed the patient to contact my office if he had new breathing problems or questions before his next appointment.  1. Right-sided pneumonia:  Improving. Plan for repeat chest x-ray PA/LAT and next appointment to ensure resolution. 2. Acute hypoxic respiratory failure:  Significantly improved. Plan to recheck oxygen walk at next appointment.  3. Mixed type COPD:  Continuing treatment with Singulair, Symbicort, and Nucala. No changes. 4. OSA: Patient recommended to shave his beard in an attempt to achieve a better seal. Plan for compliance download at next appointment. 5. GERD: Controlled with his  increased dose of Nexium. No changes. 6. Chronic allergic rhinitis: Controlled with his Singulair, Zyrtec, and Dymista. Patient also on Nucala  prescribed by allergy/immunology. 7. Health maintenance: Status post Pneumovax November 2017, Prevnar June 2016, & Tdap Harrell 2015. Administering Flu Vaccine today.  8. Follow-up: Return to clinic in 3 weeks.   Sonia Baller Ashok Cordia, M.D. [facility] Pulmonary [facility] Critical Care Pager:  [phone] After 3pm or if no response, call [phone] 2:50 PM 03/09/17

## 2017-03-11 ENCOUNTER — Other Ambulatory Visit (INDEPENDENT_AMBULATORY_CARE_PROVIDER_SITE_OTHER): Payer: Self-pay

## 2017-03-11 ENCOUNTER — Ambulatory Visit (INDEPENDENT_AMBULATORY_CARE_PROVIDER_SITE_OTHER): Payer: BLUE CROSS/BLUE SHIELD | Admitting: Orthopaedic Surgery

## 2017-03-11 ENCOUNTER — Ambulatory Visit (INDEPENDENT_AMBULATORY_CARE_PROVIDER_SITE_OTHER): Payer: BLUE CROSS/BLUE SHIELD

## 2017-03-11 DIAGNOSIS — M79602 Pain in left arm: Secondary | ICD-10-CM

## 2017-03-11 DIAGNOSIS — M542 Cervicalgia: Secondary | ICD-10-CM | POA: Diagnosis not present

## 2017-03-11 NOTE — Progress Notes (Signed)
The patient is continue to complain of neck pain with radicular symptoms going down his left arm. He gets numbness and tingling from the neck down into the hand and is mainly in the C5 and C6 distribution. There still no weakness on exam.  On exam his triceps and wrist flexion extension is strong. He's had good grip strength. He does have subjective decreased sensation in his hand and wrist. X-rays of the cervical spine shows severe degenerative disease from C4-C7 with significant osteophytes and disc space narrowing.  Given the severity of his radicular symptoms going down his arm we will obtain an MRI to rule out herniated disc. We will be looking for nerve compression as well. The diclofenac is at least help with inflammation and decreasing his pain some. We will see him back after this MRI is obtained.

## 2017-03-12 ENCOUNTER — Ambulatory Visit (INDEPENDENT_AMBULATORY_CARE_PROVIDER_SITE_OTHER): Payer: BLUE CROSS/BLUE SHIELD | Admitting: *Deleted

## 2017-03-12 DIAGNOSIS — J4550 Severe persistent asthma, uncomplicated: Secondary | ICD-10-CM

## 2017-03-17 ENCOUNTER — Ambulatory Visit: Payer: Self-pay | Admitting: *Deleted

## 2017-03-18 ENCOUNTER — Ambulatory Visit (INDEPENDENT_AMBULATORY_CARE_PROVIDER_SITE_OTHER): Payer: BLUE CROSS/BLUE SHIELD | Admitting: Physician Assistant

## 2017-03-18 ENCOUNTER — Encounter: Payer: Self-pay | Admitting: Physician Assistant

## 2017-03-18 VITALS — BP 128/76 | HR 86 | Temp 97.4°F | Resp 18 | Ht 72.0 in | Wt 371.0 lb

## 2017-03-18 DIAGNOSIS — Z6841 Body Mass Index (BMI) 40.0 and over, adult: Secondary | ICD-10-CM

## 2017-03-18 DIAGNOSIS — J441 Chronic obstructive pulmonary disease with (acute) exacerbation: Secondary | ICD-10-CM | POA: Diagnosis not present

## 2017-03-18 DIAGNOSIS — K219 Gastro-esophageal reflux disease without esophagitis: Secondary | ICD-10-CM | POA: Diagnosis not present

## 2017-03-18 DIAGNOSIS — J449 Chronic obstructive pulmonary disease, unspecified: Secondary | ICD-10-CM

## 2017-03-18 DIAGNOSIS — G4733 Obstructive sleep apnea (adult) (pediatric): Secondary | ICD-10-CM

## 2017-03-18 MED ORDER — AZITHROMYCIN 250 MG PO TABS: ORAL_TABLET | ORAL | 1 refills | Status: AC

## 2017-03-18 MED ORDER — TRAMADOL HCL 50 MG PO TABS: 50.0000 mg | ORAL_TABLET | Freq: Four times a day (QID) | ORAL | 0 refills | Status: DC | PRN

## 2017-03-18 MED ORDER — ESOMEPRAZOLE MAGNESIUM 40 MG PO CPDR: 40.0000 mg | DELAYED_RELEASE_CAPSULE | Freq: Two times a day (BID) | ORAL | 1 refills | Status: DC

## 2017-03-18 MED ORDER — PREDNISONE 20 MG PO TABS: ORAL_TABLET | ORAL | 0 refills | Status: AC

## 2017-03-18 NOTE — Progress Notes (Signed)
Subjective:    Patient ID: Mark Harrell, male    DOB: 10/11/1960, 56 y.o.   MRN: [id number]  Cough  Associated symptoms include postnasal drip, rhinorrhea, a sore throat, shortness of breath and wheezing. Pertinent negatives include no chills, ear pain, fever or headaches.   56 y.o. morbidly obese WM with history of polypharmacy, multiple admissions for respiratory failure with mixed COPD and untreated OSA following with Dr. Ashok Cordia last seen 03/09/2017, presents with asthma flare/bronchitis. States he has been coughing since discharge, productive mucus yellow green, worse x Friday, worse with lying down, wheezing worse with lying down. Doing nebulizer twice a day, cough syrup every 12 hours.   Has not shaved beard, will do this and retry CPAP to help with breathing. He is on nexium 40mg  once a day, still have GERD worse with lying. Bed is not elevated, he does have pillows.   BMI is Body mass index is 50.32 kg/m., he is working on diet and exercise. Wt Readings from Last 3 Encounters:  03/18/17 (!) 371 lb (168.3 kg)  03/09/17 (!) 376 lb 2 oz (170.6 kg)  02/25/17 (!) 373 lb (169.2 kg)     Blood pressure 128/76, pulse 86, temperature (!) 97.4 F (36.3 C), resp. rate 18, height 6' (1.829 m), weight (!) 371 lb (168.3 kg), SpO2 95 %.  Medications Current Outpatient Prescriptions on File Prior to Visit  Medication Sig  . albuterol (PROVENTIL HFA;VENTOLIN HFA) 108 (90 Base) MCG/ACT inhaler 1 to 2 inhalations 10 to 15 minutes apart every 4 hrs to rescue Asthma  . albuterol (PROVENTIL) (2.5 MG/3ML) 0.083% nebulizer solution Inhale 2.5 mg into the lungs every 4 (four) hours as needed.  [name] alfuzosin (UROXATRAL) 10 MG 24 hr tablet Take 10 mg by mouth daily.    . baclofen (LIORESAL) 10 MG tablet Take 10 mg by mouth 3 (three) times daily as needed for muscle spasms.   . bisoprolol-hydrochlorothiazide (ZIAC) 5-6.25 MG tablet TAKE 1 TABLET BY MOUTH DAILY.  . budesonide-formoterol (SYMBICORT) 160-4.5  MCG/ACT inhaler Inhale 2 puffs into the lungs 2 (two) times daily.  . Calcium Carbonate-Vitamin D (CALCIUM + D PO) Take 500 mg by mouth 2 (two) times daily.   . cetaphil (CETAPHIL) lotion Apply topically 2 (two) times daily.  . cetirizine (ZYRTEC) 10 MG tablet Take 10 mg by mouth daily.  . chlorpheniramine-HYDROcodone (TUSSIONEX) 10-8 MG/5ML SUER Take 5 mLs by mouth every 12 (twelve) hours.  . Cholecalciferol (VITAMIN D PO) Take 10,000-20,000 Units by mouth 2 (two) times daily. 20000 in the morning and 10000 in the evening  . clotrimazole-betamethasone (LOTRISONE) cream Apply 1 application topically 2 (two) times daily.  [name] co-enzyme Q-10 30 MG capsule Take 30 mg by mouth daily.  . cyclobenzaprine (FLEXERIL) 10 MG tablet Take 10 mg by mouth 3 (three) times daily.   [name] desmopressin (DDAVP) 0.2 MG tablet Take 200 mcg by mouth at bedtime.  . diclofenac sodium (VOLTAREN) 1 % GEL Apply 4 g topically 4 (four) times daily.  [name] donepezil (ARICEPT) 23 MG TABS tablet Take 23 mg by mouth at bedtime.  . DYMISTA 137-50 MCG/ACT SUSP PLACE 2 SPRAYS INTO BOTH NOSTRILS 2 (TWO) TIMES DAILY.  [name] Eluxadoline (VIBERZI) 100 MG TABS Take 1 tablet by mouth 2 (two) times daily.   [name] esomeprazole (NEXIUM) 40 MG capsule Take 1 capsule (40 mg total) by mouth daily.  [name] gabapentin (NEURONTIN) 600 MG tablet Take 1,800 mg by mouth 3 (three) times daily.   [name] guaiFENesin (  MUCINEX) 600 MG 12 hr tablet Take 600 mg by mouth 2 (two) times daily as needed for cough or to loosen phlegm.   . LUTEIN PO Take 1 tablet by mouth 2 (two) times daily.  . memantine (NAMENDA) 10 MG tablet Take 10 mg by mouth 2 (two) times daily.   . Mepolizumab (NUCALA) 100 MG SOLR Pt receives injection once a month  . Methylfol-Algae-B12-Acetylcyst (CEREFOLIN NAC) 6-90.314-2-600 MG TABS Take 1 tablet by mouth daily.   . montelukast (SINGULAIR) 10 MG tablet TAKE 1 TABLET (10 MG TOTAL) BY MOUTH DAILY.  . Multiple Vitamins-Minerals (MULTIVITAMIN WITH MINERALS) tablet  Take 1 tablet by mouth daily.  . ondansetron (ZOFRAN-ODT) 4 MG disintegrating tablet TAKE 1 TABLET (4 MG TOTAL) BY MOUTH EVERY 6 (SIX) HOURS AS NEEDED FOR NAUSEA OR VOMITING.  [name] oxyCODONE-acetaminophen (PERCOCET) 10-325 MG tablet Take 1 tablet by mouth every 4 (four) hours as needed for pain.  [name] oxymetazoline (AFRIN) 0.05 % nasal spray Place 1 spray into both nostrils See admin instructions. 1 SPRAY PER SIDE EACH NIGHT BEFORE DYMISTA  . pentosan polysulfate (ELMIRON) 100 MG capsule Take 100 mg by mouth 3 (three) times daily before meals.   . Phosphatidylserine-DHA-EPA (VAYACOG PO) Take 1 capsule by mouth 2 (two) times daily.  . Potassium 99 MG TABS Take 1 tablet by mouth daily.  . pravastatin (PRAVACHOL) 40 MG tablet TAKE 1 TABLET BY MOUTH AT BEDTIME  . PRESCRIPTION MEDICATION Pt receives weekly allergy shots  . Probiotic Product (PROBIOTIC PO) Take 1 capsule by mouth 2 (two) times daily.  . sertraline (ZOLOFT) 50 MG tablet Take 50 mg by mouth 2 (two) times daily.   . sodium chloride (OCEAN) 0.65 % SOLN nasal spray Place 1 spray into both nostrils 4 (four) times daily as needed for congestion. Uses each time before other nasal sprays  . tadalafil (CIALIS) 5 MG tablet Take 5 mg by mouth every evening.   . triamcinolone cream (KENALOG) 0.1 % APPLY TOPICALLY 4 TIMES A DAY (Patient taking differently: APPLY TOPICALLY 2 TIMES A DAY AS NEEDED for itching)  . trospium (SANCTURA) 20 MG tablet Take 20 mg by mouth 2 (two) times daily.   [name] ZENPEP 25000-79000 units CPEP Take 2 capsules by mouth 3 (three) times daily.   Current Facility-Administered Medications on File Prior to Visit  Medication  . Mepolizumab SOLR 100 mg    Problem list He has Hypertension; HLD (hyperlipidemia); BPH (benign prostatic hyperplasia); Testosterone Deficiency; IBS (irritable bowel syndrome); Partial complex seizure disorder with intractable epilepsy ([place]); Depression, major, recurrent, in partial remission ([place]); COPD with  asthma ([place]); Diverticula of colon; Positive TB test; Vitamin D deficiency; Prediabetes; Obesity, Class III, BMI 40-49.9 (morbid obesity) ([place]); GERD (gastroesophageal reflux disease); Medication management; SDAT; Asthma; OSA and COPD overlap syndrome ([place]); BMI 47.46,   adult ([place]); Glucose intolerance (impaired glucose tolerance); Polypharmacy; Recurrent infections; Chronic nonseasonal allergic rhinitis due to pollen; Acute medial meniscal tear, left, subsequent encounter; COPD mixed type ([place]); DM (diabetes mellitus) ([place]); Irritant contact dermatitis due to frequent handwashing; Headache; Lumbar radiculopathy; Memory change; Migraine without aura and without status migrainosus, not intractable; Osteoarthritis of spine with radiculopathy, lumbar region; Risk for falls; Acute respiratory failure with hypoxia ([place]); Bronchitis; and Pneumonia involving right lung on his problem list.  Review of Systems  Constitutional: Negative for chills, diaphoresis and fever.  HENT: Positive for congestion, postnasal drip, rhinorrhea, sinus pressure and sore throat. Negative for ear pain, sneezing, trouble swallowing and voice change.   Eyes:  Negative.   Respiratory: Positive for cough, shortness of breath and wheezing. Negative for chest tightness.   Cardiovascular: Negative.   Gastrointestinal: Negative.   Genitourinary: Negative.   Musculoskeletal: Negative.  Negative for neck pain.  Neurological: Negative.  Negative for headaches.       Objective:   Physical Exam  Constitutional: He is oriented to person, place, and time. He appears well-developed and well-nourished.  Morbidly obese  HENT:  Head: Normocephalic and atraumatic.  Right Ear: External ear normal.  Left Ear: External ear normal.  Nose: Nose normal.  Mouth/Throat: Oropharynx is clear and moist.  Eyes: Pupils are equal, round, and reactive to light. Conjunctivae are normal.  Neck: Normal range of motion. Neck supple.  Cardiovascular: Normal  rate, regular rhythm and normal heart sounds.   No murmur heard. Pulmonary/Chest: Effort normal. No respiratory distress. He has wheezes (diffusely). He has no rales. He exhibits no tenderness.  Abdominal: Soft. Bowel sounds are normal.  Lymphadenopathy:    He has no cervical adenopathy.  Neurological: He is alert and oriented to person, place, and time.  Skin: Skin is warm and dry.       Assessment & Plan:   OSA and COPD overlap syndrome ([place]) Encouraged to shave beard, follow up Dr. Ashok Cordia and get on CPAP.   COPD exacerbation (HCC) -     predniSONE (DELTASONE) 20 MG tablet; 1 pill 3 x a day for 3 days, 1 pill 2 x a day x 3 days, 1 pill a day x 5 days with food -     azithromycin (ZITHROMAX) 250 MG tablet; Take 2 tablets (500 mg) on  Day 1,  followed by 1 tablet (250 mg) once daily on Days 2 through 5. -     traMADol (ULTRAM) 50 MG tablet; Take 1 tablet (50 mg total) by mouth every 6 (six) hours as needed (cough).  BMI 47.46,   adult (HCC) Weight loss advised  Gastroesophageal reflux disease without esophagitis -     esomeprazole (NEXIUM) 40 MG capsule; Take 1 capsule (40 mg total) by mouth 2 (two) times daily before a meal. - follow up Dr. [name], get risers for bed    Future Appointments Date Time Provider [place]  03/28/2017 2:10 PM [facility] MR 3 [facility]-315MRI [facility] [facility]  04/01/2017 3:15 PM Mcarthur Rossetti, MD [facility] None  04/09/2017 3:00 PM Javier Glazier, MD [facility] None  04/10/2017 2:45 PM [facility] NURSE [name]  04/14/2017 3:30 PM [name], MD [name]  04/23/2017 3:45 PM [name], MD [name]  05/12/2017 2:15 PM [place], Max T, DPM [name]  11/05/2017 3:00 PM [name], MD [name]

## 2017-03-18 NOTE — Patient Instructions (Addendum)
Do nexium 40mg   TWICE A DAY until you follow up with Dr. [name] for GERD and aspiration pneumonia, likely needs EGD   Get risers for head of your bed to help prevent aspiration and GERD  Get on CPAP/BiPAP  Stop diclofenac while you are on the prednisone  Can try tramadol for cough every 6-8 hours  Can do duoneb every 2-4 hours  Can do zpak if not better in 3-5 days but try to avoid.

## 2017-03-19 ENCOUNTER — Telehealth (INDEPENDENT_AMBULATORY_CARE_PROVIDER_SITE_OTHER): Payer: Self-pay

## 2017-03-19 NOTE — Telephone Encounter (Signed)
Ok to send this in? 

## 2017-03-19 NOTE — Telephone Encounter (Signed)
Diclofenac 75mg  1 po BID prn  Requesting 90 day supply

## 2017-03-20 ENCOUNTER — Other Ambulatory Visit (INDEPENDENT_AMBULATORY_CARE_PROVIDER_SITE_OTHER): Payer: Self-pay

## 2017-03-20 ENCOUNTER — Other Ambulatory Visit: Payer: Self-pay | Admitting: Internal Medicine

## 2017-03-20 DIAGNOSIS — R197 Diarrhea, unspecified: Secondary | ICD-10-CM

## 2017-03-20 DIAGNOSIS — G4733 Obstructive sleep apnea (adult) (pediatric): Secondary | ICD-10-CM | POA: Diagnosis not present

## 2017-03-20 MED ORDER — DICLOFENAC SODIUM 75 MG PO TBEC: 75.0000 mg | DELAYED_RELEASE_TABLET | Freq: Two times a day (BID) | ORAL | 1 refills | Status: DC

## 2017-03-20 NOTE — Telephone Encounter (Signed)
Called into pharmacy

## 2017-03-22 ENCOUNTER — Other Ambulatory Visit: Payer: Self-pay | Admitting: Internal Medicine

## 2017-03-25 DIAGNOSIS — K648 Other hemorrhoids: Secondary | ICD-10-CM | POA: Diagnosis not present

## 2017-03-25 DIAGNOSIS — R197 Diarrhea, unspecified: Secondary | ICD-10-CM | POA: Diagnosis not present

## 2017-03-26 DIAGNOSIS — G43709 Chronic migraine without aura, not intractable, without status migrainosus: Secondary | ICD-10-CM | POA: Diagnosis not present

## 2017-03-28 ENCOUNTER — Ambulatory Visit
Admission: RE | Admit: 2017-03-28 | Discharge: 2017-03-28 | Disposition: A | Discharge status: Home / Self Care | Payer: BLUE CROSS/BLUE SHIELD | Source: Ambulatory Visit | Attending: Orthopaedic Surgery | Admitting: Orthopaedic Surgery

## 2017-03-28 DIAGNOSIS — M542 Cervicalgia: Secondary | ICD-10-CM

## 2017-04-01 ENCOUNTER — Ambulatory Visit (INDEPENDENT_AMBULATORY_CARE_PROVIDER_SITE_OTHER): Payer: BLUE CROSS/BLUE SHIELD | Admitting: Orthopaedic Surgery

## 2017-04-01 ENCOUNTER — Encounter (INDEPENDENT_AMBULATORY_CARE_PROVIDER_SITE_OTHER): Payer: Self-pay | Admitting: Orthopaedic Surgery

## 2017-04-01 DIAGNOSIS — M542 Cervicalgia: Secondary | ICD-10-CM

## 2017-04-01 NOTE — Progress Notes (Signed)
The patient comes in today with follow-up for his neck and left-sided radicular symptoms going down into his shoulder and arm.  We had set him up for an MRI of his shoulder but due to his size and problems with a scanner in just his level of discomfort they are unable to complete the scan.  He said he elected to try CT scan however due to the severity of the arthritis and cervical spine worsening in the mid cervical spine MRI would be the appropriate study to really assess the spinal cord and the nerves.  I explained to him this in significant detail.  On exam he still has significant amount of pain to the left side of his neck radiating to the shoulder with painful range of motion the shoulder as well but it seems to be more radicular from the neck itself.  I gave him a prescription to take 1-2 Valium pills prior to the MRI and we will see about setting this up again to see if we can get him through this because there is no other option but I see for him.  I recommended therapy as well but he has a trouble getting to therapy.

## 2017-04-02 DIAGNOSIS — N401 Enlarged prostate with lower urinary tract symptoms: Secondary | ICD-10-CM | POA: Diagnosis not present

## 2017-04-02 DIAGNOSIS — N3944 Nocturnal enuresis: Secondary | ICD-10-CM | POA: Diagnosis not present

## 2017-04-02 DIAGNOSIS — R351 Nocturia: Secondary | ICD-10-CM | POA: Diagnosis not present

## 2017-04-03 ENCOUNTER — Ambulatory Visit: Payer: BLUE CROSS/BLUE SHIELD | Admitting: Physician Assistant

## 2017-04-03 ENCOUNTER — Encounter: Payer: Self-pay | Admitting: Physician Assistant

## 2017-04-03 VITALS — BP 130/82 | HR 87 | Temp 98.9°F | Resp 20 | Ht 72.0 in | Wt 378.4 lb

## 2017-04-03 DIAGNOSIS — R059 Cough, unspecified: Secondary | ICD-10-CM

## 2017-04-03 DIAGNOSIS — J449 Chronic obstructive pulmonary disease, unspecified: Secondary | ICD-10-CM | POA: Diagnosis not present

## 2017-04-03 DIAGNOSIS — R05 Cough: Secondary | ICD-10-CM | POA: Diagnosis not present

## 2017-04-03 DIAGNOSIS — G4733 Obstructive sleep apnea (adult) (pediatric): Secondary | ICD-10-CM

## 2017-04-03 DIAGNOSIS — I10 Essential (primary) hypertension: Secondary | ICD-10-CM | POA: Diagnosis not present

## 2017-04-03 LAB — CBC WITH DIFFERENTIAL/PLATELET
Basophils Absolute: 91 cells/uL (ref 0–200)
Basophils Relative: 0.5 %
EOS ABS: 18 {cells}/uL (ref 15–500)
Eosinophils Relative: 0.1 %
HCT: 45.1 % (ref 38.5–50.0)
Hemoglobin: 15 g/dL (ref 13.2–17.1)
Lymphs Abs: 2020 cells/uL (ref 850–3900)
MCH: 29.6 pg (ref 27.0–33.0)
MCHC: 33.3 g/dL (ref 32.0–36.0)
MCV: 89 fL (ref 80.0–100.0)
MONOS PCT: 7 %
MPV: 10.6 fL (ref 7.5–12.5)
NEUTROS PCT: 81.3 %
Neutro Abs: 14797 cells/uL — ABNORMAL HIGH (ref 1500–7800)
PLATELETS: 303 10*3/uL (ref 140–400)
RBC: 5.07 10*6/uL (ref 4.20–5.80)
RDW: 13.4 % (ref 11.0–15.0)
TOTAL LYMPHOCYTE: 11.1 %
WBC: 18.2 10*3/uL — ABNORMAL HIGH (ref 3.8–10.8)
WBCMIX: 1274 {cells}/uL — AB (ref 200–950)

## 2017-04-03 LAB — BASIC METABOLIC PANEL WITH GFR
BUN / CREAT RATIO: 25 (calc) — AB (ref 6–22)
BUN: 27 mg/dL — ABNORMAL HIGH (ref 7–25)
CHLORIDE: 102 mmol/L (ref 98–110)
CO2: 32 mmol/L (ref 20–32)
Calcium: 8.6 mg/dL (ref 8.6–10.3)
Creat: 1.08 mg/dL (ref 0.70–1.33)
GFR, EST AFRICAN AMERICAN: 88 mL/min/{1.73_m2} (ref >=60)
GFR, Est Non African American: 76 mL/min/{1.73_m2} (ref >=60)
GLUCOSE: 203 mg/dL — AB (ref 65–99)
POTASSIUM: 4.5 mmol/L (ref 3.5–5.3)
SODIUM: 140 mmol/L (ref 135–146)

## 2017-04-03 MED ORDER — HYDROCOD POLST-CPM POLST ER 10-8 MG/5ML PO SUER: 5.0000 mL | Freq: Two times a day (BID) | ORAL | 0 refills | Status: DC

## 2017-04-03 NOTE — Patient Instructions (Addendum)
Please be aware that some of the medications that you are on can sometimes cause a rare and potentially dangerous adverse reaction, called SEROTONIN SYNDROME: Symptoms of this condition include (but are not limited to):  Agitation or restlessness, confusion, rapid heart rate and high blood pressure, dilated pupils, loss of muscle coordination or twitching muscles, muscle rigidity/stiffness, sweating and/or flushing, diarrhea, headache, shivering, goose bumps. If you have any of these symptoms you may have to stop the medication. Call your health care provider immediately.  Severe serotonin syndrome can be life-threatening emergency. Signs and symptoms of a severe reaction may include: high fever, seizures, irregular heartbeat, unconsciousness or altered level of awareness or personality changes.  If you have any of these new symptoms, call 911 or have someone take you to the emergency room.   Check out epocrates and get free version and check your med interaction  We are stopping the tramadol due to possible serotonin syndrome Start back on syrup twice a day  Follow up with GI for GERD  Try to get back on CPAP when you feel safe doing so, this will help with your breathing  Common causes of cough OR hoarseness OR sore throat:   Allergies, Viral Infections, Acid Reflux and Bacterial Infections.   Allergies and viral infections cause a cough OR sore throat by post nasal drip and are often worse at night, can also have sneezing, lower grade fevers, clear/yellow mucus. This is best treated with allergy medications or nasal sprays.  Please get on allegra for 1-2 weeks The strongest is allegra or fexafinadine  Cheapest at [employer], [employer], [employer]   Bacterial infections are more severe than allergies or viral infections with fever, teeth pain, fatigue. This can be treated with prednisone and the same over the counter medication and after 7 days can be treated with an antibiotic.   Silent reflux/GERD  can cause a cough OR sore throat OR hoarseness WITHOUT heart burn because the esophagus that goes to the stomach and trachea that goes to the lungs are very close and when you lay down the acid can irritate your throat and lungs. This can cause hoarseness, cough, and wheezing. Please stop any alcohol or anti-inflammatories like aleve/advil/ibuprofen and start an over the counter Prilosec or omeprazole 1-2 times daily 42mins before food for 2 weeks, then switch to over the counter zantac/ratinidine or pepcid/famotadine once at night for 2 weeks.    sometimes irritation causes more irritation. Try voice rest, use sugar free cough drops to prevent coughing, and try to stop clearing your throat.   If you ever have a cough that does not go away after trying these things please make a follow up visit for further evaluation or we can refer you to a specialist. Or if you ever have shortness of breath or chest pain go to the ER.

## 2017-04-03 NOTE — Progress Notes (Signed)
Subjective:    Patient ID: Mark Harrell, male    DOB: Feb 08, 1961, 56 y.o.   MRN: [id number]  HPI  56 y.o. morbidly obese WM with history of polypharmacy, multiple admissions for respiratory failure with mixed COPD and untreated OSA following with Dr. Ashok Cordia last seen 03/18/2017, presents with productive cough. He states his cough is better after the antibiotic, he is on tramadol twice a day and having some break through not doing great with it and prefers the cough syrup twice a day and not taking hydrocodone with it. Got a bed wedge that is helping with GERD. Doing nebulizer twice a day.   He has started on emgality for migraines.  Has shaved beard, has not retried CPAP to help with breathing, states he is afraid after aspiration. He is on nexium 40mg  twice a day since last visit and states GERD is helping.   BMI is Body mass index is 51.32 kg/m., he is working on diet and exercise. Wt Readings from Last 3 Encounters:  04/03/17 (!) 378 lb 6.4 oz (171.6 kg)  03/18/17 (!) 371 lb (168.3 kg)  03/09/17 (!) 376 lb 2 oz (170.6 kg)   Blood pressure 130/82, pulse 87, temperature 98.9 F (37.2 C), resp. rate 20, height 6' (1.829 m), weight (!) 378 lb 6.4 oz (171.6 kg), SpO2 94 %.   Medications Current Outpatient Medications on File Prior to Visit  Medication Sig  . albuterol (PROVENTIL HFA;VENTOLIN HFA) 108 (90 Base) MCG/ACT inhaler 1 to 2 inhalations 10 to 15 minutes apart every 4 hrs to rescue Asthma  . albuterol (PROVENTIL) (2.5 MG/3ML) 0.083% nebulizer solution Inhale 2.5 mg into the lungs every 4 (four) hours as needed.  [name] alfuzosin (UROXATRAL) 10 MG 24 hr tablet Take 10 mg by mouth daily.    . baclofen (LIORESAL) 10 MG tablet Take 10 mg by mouth 3 (three) times daily as needed for muscle spasms.   . bisoprolol-hydrochlorothiazide (ZIAC) 5-6.25 MG tablet TAKE 1 TABLET BY MOUTH DAILY.  . budesonide-formoterol (SYMBICORT) 160-4.5 MCG/ACT inhaler Inhale 2 puffs into the lungs 2 (two) times  daily.  . Calcium Carbonate-Vitamin D (CALCIUM + D PO) Take 500 mg by mouth 2 (two) times daily.   . cetaphil (CETAPHIL) lotion Apply topically 2 (two) times daily.  . cetirizine (ZYRTEC) 10 MG tablet Take 10 mg by mouth daily.  . Cholecalciferol (VITAMIN D PO) Take 10,000-20,000 Units by mouth 2 (two) times daily. 20000 in the morning and 10000 in the evening  . clotrimazole-betamethasone (LOTRISONE) cream Apply 1 application topically 2 (two) times daily.  [name] co-enzyme Q-10 30 MG capsule Take 30 mg by mouth daily.  . cyclobenzaprine (FLEXERIL) 10 MG tablet Take 10 mg by mouth 3 (three) times daily.   [name] desmopressin (DDAVP) 0.2 MG tablet Take 200 mcg by mouth at bedtime.  . diclofenac (VOLTAREN) 75 MG EC tablet Take 1 tablet (75 mg total) by mouth 2 (two) times daily.  . diclofenac sodium (VOLTAREN) 1 % GEL Apply 4 g topically 4 (four) times daily.  [name] donepezil (ARICEPT) 23 MG TABS tablet Take 23 mg by mouth at bedtime.  . DYMISTA 137-50 MCG/ACT SUSP PLACE 2 SPRAYS INTO BOTH NOSTRILS 2 (TWO) TIMES DAILY.  [name] Eluxadoline (VIBERZI) 100 MG TABS Take 1 tablet by mouth 2 (two) times daily.   [name] esomeprazole (NEXIUM) 40 MG capsule Take 1 capsule (40 mg total) by mouth 2 (two) times daily before a meal.  . gabapentin (NEURONTIN) 600 MG tablet Take  1,800 mg by mouth 3 (three) times daily.   [name] guaiFENesin (MUCINEX) 600 MG 12 hr tablet Take 600 mg by mouth 2 (two) times daily as needed for cough or to loosen phlegm.   . LUTEIN PO Take 1 tablet by mouth 2 (two) times daily.  . memantine (NAMENDA) 10 MG tablet Take 10 mg by mouth 2 (two) times daily.   . Mepolizumab (NUCALA) 100 MG SOLR Pt receives injection once a month  . Methylfol-Algae-B12-Acetylcyst (CEREFOLIN NAC) 6-90.314-2-600 MG TABS Take 1 tablet by mouth daily.   . montelukast (SINGULAIR) 10 MG tablet TAKE 1 TABLET (10 MG TOTAL) BY MOUTH DAILY.  . Multiple Vitamins-Minerals (MULTIVITAMIN WITH MINERALS) tablet Take 1 tablet by mouth daily.  .  ondansetron (ZOFRAN-ODT) 4 MG disintegrating tablet TAKE 1 TABLET (4 MG TOTAL) BY MOUTH EVERY 6 (SIX) HOURS AS NEEDED FOR NAUSEA OR VOMITING.  [name] oxyCODONE-acetaminophen (PERCOCET) 10-325 MG tablet Take 1 tablet by mouth every 4 (four) hours as needed for pain.  [name] oxymetazoline (AFRIN) 0.05 % nasal spray Place 1 spray into both nostrils See admin instructions. 1 SPRAY PER SIDE EACH NIGHT BEFORE DYMISTA  . pentosan polysulfate (ELMIRON) 100 MG capsule Take 100 mg by mouth 3 (three) times daily before meals.   . Phosphatidylserine-DHA-EPA (VAYACOG PO) Take 1 capsule by mouth 2 (two) times daily.  . Potassium 99 MG TABS Take 1 tablet by mouth daily.  . pravastatin (PRAVACHOL) 40 MG tablet TAKE 1 TABLET BY MOUTH AT BEDTIME  . PRESCRIPTION MEDICATION Pt receives weekly allergy shots  . Probiotic Product (PROBIOTIC PO) Take 1 capsule by mouth 2 (two) times daily.  . sertraline (ZOLOFT) 50 MG tablet Take 50 mg by mouth 2 (two) times daily.   . sodium chloride (OCEAN) 0.65 % SOLN nasal spray Place 1 spray into both nostrils 4 (four) times daily as needed for congestion. Uses each time before other nasal sprays  . tadalafil (CIALIS) 5 MG tablet Take 5 mg by mouth every evening.   . traMADol (ULTRAM) 50 MG tablet Take 1 tablet (50 mg total) by mouth every 6 (six) hours as needed (cough).  . triamcinolone cream (KENALOG) 0.1 % APPLY TOPICALLY 4 TIMES A DAY (Patient taking differently: APPLY TOPICALLY 2 TIMES A DAY AS NEEDED for itching)  . trospium (SANCTURA) 20 MG tablet Take 20 mg by mouth 2 (two) times daily.   [name] ZENPEP 25000-79000 units CPEP Take 2 capsules by mouth 3 (three) times daily.   Current Facility-Administered Medications on File Prior to Visit  Medication  . Mepolizumab SOLR 100 mg    Problem list He has Hypertension; HLD (hyperlipidemia); BPH (benign prostatic hyperplasia); Testosterone Deficiency; IBS (irritable bowel syndrome); Partial complex seizure disorder with intractable epilepsy  ([place]); Depression, major, recurrent, in partial remission ([place]); COPD with asthma ([place]); Diverticula of colon; Positive TB test; Vitamin D deficiency; Prediabetes; Obesity, Class III, BMI 40-49.9 (morbid obesity) ([place]); GERD (gastroesophageal reflux disease); Medication management; SDAT; Asthma; OSA and COPD overlap syndrome ([place]); BMI 47.46,   adult ([place]); Glucose intolerance (impaired glucose tolerance); Polypharmacy; Recurrent infections; Chronic nonseasonal allergic rhinitis due to pollen; Acute medial meniscal tear, left, subsequent encounter; COPD mixed type ([place]); DM (diabetes mellitus) ([place]); Irritant contact dermatitis due to frequent handwashing; Headache; Lumbar radiculopathy; Memory change; Migraine without aura and without status migrainosus, not intractable; Osteoarthritis of spine with radiculopathy, lumbar region; Risk for falls; Acute respiratory failure with hypoxia ([place]); Bronchitis; Pneumonia involving right lung; and Cervicalgia on their problem list.  Review of  Systems  Constitutional: Positive for fever (flushing). Negative for chills and diaphoresis.  HENT: Positive for postnasal drip and rhinorrhea. Negative for congestion, ear pain, sinus pressure, sneezing, sore throat, trouble swallowing and voice change.   Eyes: Negative.   Respiratory: Positive for cough and wheezing. Negative for chest tightness and shortness of breath.   Cardiovascular: Negative.   Gastrointestinal: Negative.   Genitourinary: Negative.   Musculoskeletal: Negative.  Negative for neck pain.  Neurological: Negative.  Negative for headaches.       Objective:   Physical Exam  Constitutional: He is oriented to person, place, and time. He appears well-developed and well-nourished.  Morbidly obese  HENT:  Head: Normocephalic and atraumatic.  Right Ear: External ear normal.  Left Ear: External ear normal.  Nose: Nose normal.  Mouth/Throat: Oropharynx is clear and moist.  Eyes: Conjunctivae are normal.  Pupils are equal, round, and reactive to light.  Neck: Normal range of motion. Neck supple.  Cardiovascular: Normal rate, regular rhythm and normal heart sounds.  No murmur heard. Pulmonary/Chest: Effort normal. No respiratory distress. He has decreased breath sounds. He has no wheezes. He has no rhonchi. He has no rales. He exhibits no tenderness.  Abdominal: Soft. Bowel sounds are normal.  Musculoskeletal:  Walks with cane due to obesity  Lymphadenopathy:    He has no cervical adenopathy.  Neurological: He is alert and oriented to person, place, and time.  Skin: Skin is warm and dry.      Assessment & Plan:  [facility]rell was seen today for acute visit and cough.  Diagnoses and all orders for this visit:  OSA and COPD overlap syndrome ([place]) Need to get back on CPAP  Cough STOP TRAMADOL? IF SEROTONIN SYNDROME WITH OTHER MEDS GET BACK ON SYRUP FOLLOW UP GI CONTINUE OTHER MEASURES COUGH DROPS/VOICE REST -     CBC with Differential/Platelet -     BASIC METABOLIC PANEL WITH GFR -     chlorpheniramine-HYDROcodone (TUSSIONEX) 10-8 MG/5ML SUER; Take 5 mLs every 12 (twelve) hours by mouth.   Future Appointments  Date Time Provider [place]  04/09/2017  3:00 PM Javier Glazier, MD [facility] None  04/10/2017  2:45 PM [facility] NURSE [name]  04/14/2017  3:30 PM [name], MD [name]  04/23/2017  3:45 PM [name], MD [name]  05/12/2017  2:15 PM [place], Max T, DPM [name]  11/05/2017  3:00 PM [name], MD [name]

## 2017-04-05 ENCOUNTER — Other Ambulatory Visit: Payer: Self-pay | Admitting: Physician Assistant

## 2017-04-05 DIAGNOSIS — G4733 Obstructive sleep apnea (adult) (pediatric): Secondary | ICD-10-CM | POA: Diagnosis not present

## 2017-04-05 MED ORDER — DOXYCYCLINE HYCLATE 100 MG PO CAPS: ORAL_CAPSULE | ORAL | 0 refills | Status: DC

## 2017-04-06 DIAGNOSIS — M5416 Radiculopathy, lumbar region: Secondary | ICD-10-CM | POA: Diagnosis not present

## 2017-04-06 DIAGNOSIS — R413 Other amnesia: Secondary | ICD-10-CM | POA: Diagnosis not present

## 2017-04-06 DIAGNOSIS — M4722 Other spondylosis with radiculopathy, cervical region: Secondary | ICD-10-CM | POA: Diagnosis not present

## 2017-04-07 ENCOUNTER — Telehealth (INDEPENDENT_AMBULATORY_CARE_PROVIDER_SITE_OTHER): Payer: Self-pay | Admitting: *Deleted

## 2017-04-07 NOTE — Telephone Encounter (Signed)
Pt is scheduled for MRI [facility] at [facility] on Wednesday Nov 14 check in at 50p for 6:00pm appt, pt is aware of appt and order has been faxed to [facility].

## 2017-04-07 NOTE — Telephone Encounter (Signed)
Tammy - did these labs ever get collected/ordered? Where are with this?  Thanks!  [name], MD [facility]

## 2017-04-07 NOTE — Telephone Encounter (Signed)
-----   Message from Anise Salvo, [place] sent at 04/02/2017  9:56 AM EST ----- Regarding: RE: MRI Yes, he will have to be. Can we just make sure and ask them for CD AND report, thanks girl    ----- Message ----- From: Maureen Chatters, CMA Sent: 04/02/2017   9:38 AM To: Anise Salvo, RMA Subject: RE: MRI                                        [facility] has open unit. Is Dr. [name] ok with sending him to there? ----- Message ----- From: Anise Salvo, RMA Sent: 04/02/2017   8:57 AM To: Maureen Chatters, CMA Subject: MRI                                            Patient could not do the MRI at [place], Dr. [name] is asking if there is another larger bore MRI somewhere we can send him to? C-spine MRI; r/o HNP Thanks!

## 2017-04-08 DIAGNOSIS — M2578 Osteophyte, vertebrae: Secondary | ICD-10-CM | POA: Diagnosis not present

## 2017-04-08 DIAGNOSIS — M4802 Spinal stenosis, cervical region: Secondary | ICD-10-CM | POA: Diagnosis not present

## 2017-04-09 ENCOUNTER — Ambulatory Visit (INDEPENDENT_AMBULATORY_CARE_PROVIDER_SITE_OTHER): Payer: BLUE CROSS/BLUE SHIELD | Admitting: Pulmonary Disease

## 2017-04-09 ENCOUNTER — Ambulatory Visit (INDEPENDENT_AMBULATORY_CARE_PROVIDER_SITE_OTHER)
Admission: RE | Admit: 2017-04-09 | Discharge: 2017-04-09 | Disposition: A | Discharge status: Home / Self Care | Payer: BLUE CROSS/BLUE SHIELD | Source: Ambulatory Visit | Attending: Pulmonary Disease | Admitting: Pulmonary Disease

## 2017-04-09 VITALS — BP 134/78 | HR 94 | Ht 72.0 in | Wt 388.6 lb

## 2017-04-09 DIAGNOSIS — G4733 Obstructive sleep apnea (adult) (pediatric): Secondary | ICD-10-CM | POA: Diagnosis not present

## 2017-04-09 DIAGNOSIS — K219 Gastro-esophageal reflux disease without esophagitis: Secondary | ICD-10-CM

## 2017-04-09 DIAGNOSIS — J449 Chronic obstructive pulmonary disease, unspecified: Secondary | ICD-10-CM | POA: Diagnosis not present

## 2017-04-09 DIAGNOSIS — R05 Cough: Secondary | ICD-10-CM | POA: Diagnosis not present

## 2017-04-09 DIAGNOSIS — J189 Pneumonia, unspecified organism: Secondary | ICD-10-CM | POA: Diagnosis not present

## 2017-04-09 MED ORDER — RANITIDINE HCL 150 MG PO TABS: 150.0000 mg | ORAL_TABLET | Freq: Two times a day (BID) | ORAL | 3 refills | Status: DC

## 2017-04-09 NOTE — Progress Notes (Signed)
Subjective:    Patient ID: Mark Harrell, male    DOB: Sep 26, 1960, 56 y.o.   MRN: [id number]  C.C.:  Follow-up after Pneumonia with Acute Hypoxic Respiratory Failure and known COPD Mixed Type, OSA, GERD, & Chronic Allergic Rhinitis.   HPI He reports he has done two courses of Azithromycin & is now on Doxycycline.   Pneumonia: Hospitalized in September with right sided lung opacity.. With a course of vancomycin/cefepime tapered to Unasyn and eventually Augmentin. Patient's x-ray today shows resolution of his previous opacities.  Acute hypoxic respiratory failure: Discharged post hospitalization on 2 L/m via nasal cannula.  Mixed type COPD: Prescribed Symbicort, Singulair, and Nucala. He still has a cough. He reports his breathing is at baseline. No exacerbations since last appointment. He reports minimal wheezing. He did finish a prolonged course of Prednisone a week ago. He is using his rescue inhaler more frequently lately.   OSA: Recommended to shave his beard at last appointment to improve mask fitting. Patient's compliance download from the last 30 days shows only use it during 2 of the days. He reports he is unable to use his CPAP due to increased coughing & sinus congestion.  GERD: Following with GI. Previously transitioned from Pepcid to Nexium. He reports he is still having significant reflux and dyspepsia with increasing his medication doses. He has a new bed wedge that he is going to try to help with positional therapy.   Chronic allergic rhinitis: Prescribed Singulair, Zyrtec, and new cannula. Following with allergy/immunology. Patient also previously has used intermittent Afrin, Dymista, and nasal saline rinse. He is having increased sinus congestion & drainage.  Review of Systems  He reports off and on fever, chills, and sweats. No bruising. He has noticed a mild rash on his left lateral hip/thigh. He has noticed pain in his left left arm radiating from his neck that he has  attributed to a pinched nerve. The pain is also positional.   Allergies  Allergen Reactions  . Bee Venom Swelling  . Ppd [Tuberculin Purified Protein Derivative] Other (See Comments)    +ppd NEG Quantferron Gold 3/13  . Fenofibrate Other (See Comments)    Back pain  . Levofloxacin Diarrhea  . Other Other (See Comments)    Some antibiotics cause diarrhea  . Verapamil Other (See Comments)    Back pain  . Claritin [Loratadine] Other (See Comments)    unknown    Current Outpatient Medications on File Prior to Visit  Medication Sig Dispense Refill  . albuterol (PROVENTIL HFA;VENTOLIN HFA) 108 (90 Base) MCG/ACT inhaler 1 to 2 inhalations 10 to 15 minutes apart every 4 hrs to rescue Asthma 60 g 1  . albuterol (PROVENTIL) (2.5 MG/3ML) 0.083% nebulizer solution Inhale 2.5 mg into the lungs every 4 (four) hours as needed.  12  . alfuzosin (UROXATRAL) 10 MG 24 hr tablet Take 10 mg by mouth daily.      . baclofen (LIORESAL) 10 MG tablet Take 10 mg by mouth 3 (three) times daily as needed for muscle spasms.     . bisoprolol-hydrochlorothiazide (ZIAC) 5-6.25 MG tablet TAKE 1 TABLET BY MOUTH DAILY. 90 tablet 1  . budesonide-formoterol (SYMBICORT) 160-4.5 MCG/ACT inhaler Inhale 2 puffs into the lungs 2 (two) times daily. 1 Inhaler 5  . Calcium Carbonate-Vitamin D (CALCIUM + D PO) Take 500 mg by mouth 2 (two) times daily.     . cetaphil (CETAPHIL) lotion Apply topically 2 (two) times daily. 236 mL 0  . cetirizine (ZYRTEC)  10 MG tablet Take 10 mg by mouth daily.    . chlorpheniramine-HYDROcodone (TUSSIONEX) 10-8 MG/5ML SUER Take 5 mLs every 12 (twelve) hours by mouth. 140 mL 0  . Cholecalciferol (VITAMIN D PO) Take 10,000-20,000 Units by mouth 2 (two) times daily. 20000 in the morning and 10000 in the evening    . clotrimazole-betamethasone (LOTRISONE) cream Apply 1 application topically 2 (two) times daily. 15 g 2  . co-enzyme Q-10 30 MG capsule Take 30 mg by mouth daily.    . cyclobenzaprine  (FLEXERIL) 10 MG tablet Take 10 mg by mouth 3 (three) times daily.     [name] desmopressin (DDAVP) 0.2 MG tablet Take 200 mcg by mouth at bedtime.  3  . diclofenac (VOLTAREN) 75 MG EC tablet Take 1 tablet (75 mg total) by mouth 2 (two) times daily. 180 tablet 1  . diclofenac sodium (VOLTAREN) 1 % GEL Apply 4 g topically 4 (four) times daily. 100 g 3  . donepezil (ARICEPT) 23 MG TABS tablet Take 23 mg by mouth at bedtime.    [name] doxycycline (VIBRAMYCIN) 100 MG capsule Take 1 capsule twice daily with food 20 capsule 0  . DYMISTA 137-50 MCG/ACT SUSP PLACE 2 SPRAYS INTO BOTH NOSTRILS 2 (TWO) TIMES DAILY.  5  . Eluxadoline (VIBERZI) 100 MG TABS Take 1 tablet by mouth 2 (two) times daily.     [name] esomeprazole (NEXIUM) 40 MG capsule Take 1 capsule (40 mg total) by mouth 2 (two) times daily before a meal. 60 capsule 1  . gabapentin (NEURONTIN) 600 MG tablet Take 1,800 mg by mouth 3 (three) times daily.     [name] guaiFENesin (MUCINEX) 600 MG 12 hr tablet Take 600 mg by mouth 2 (two) times daily as needed for cough or to loosen phlegm.     . LUTEIN PO Take 1 tablet by mouth 2 (two) times daily.    . memantine (NAMENDA) 10 MG tablet Take 10 mg by mouth 2 (two) times daily.     . Mepolizumab (NUCALA) 100 MG SOLR Pt receives injection once a month    . Methylfol-Algae-B12-Acetylcyst (CEREFOLIN NAC) 6-90.314-2-600 MG TABS Take 1 tablet by mouth daily.     . montelukast (SINGULAIR) 10 MG tablet TAKE 1 TABLET (10 MG TOTAL) BY MOUTH DAILY. 90 tablet 1  . Multiple Vitamins-Minerals (MULTIVITAMIN WITH MINERALS) tablet Take 1 tablet by mouth daily.    . ondansetron (ZOFRAN-ODT) 4 MG disintegrating tablet TAKE 1 TABLET (4 MG TOTAL) BY MOUTH EVERY 6 (SIX) HOURS AS NEEDED FOR NAUSEA OR VOMITING. 30 tablet 0  . oxyCODONE-acetaminophen (PERCOCET) 10-325 MG tablet Take 1 tablet by mouth every 4 (four) hours as needed for pain. 60 tablet 0  . oxymetazoline (AFRIN) 0.05 % nasal spray Place 1 spray into both nostrils See admin  instructions. 1 SPRAY PER SIDE EACH NIGHT BEFORE DYMISTA    . pentosan polysulfate (ELMIRON) 100 MG capsule Take 100 mg by mouth 3 (three) times daily before meals.     . Phosphatidylserine-DHA-EPA (VAYACOG PO) Take 1 capsule by mouth 2 (two) times daily.    . Potassium 99 MG TABS Take 1 tablet by mouth daily.    . pravastatin (PRAVACHOL) 40 MG tablet TAKE 1 TABLET BY MOUTH AT BEDTIME 90 tablet 2  . PRESCRIPTION MEDICATION Pt receives weekly allergy shots    . Probiotic Product (PROBIOTIC PO) Take 1 capsule by mouth 2 (two) times daily.    . sertraline (ZOLOFT) 50 MG tablet Take 50 mg by mouth 2 (  two) times daily.     . sodium chloride (OCEAN) 0.65 % SOLN nasal spray Place 1 spray into both nostrils 4 (four) times daily as needed for congestion. Uses each time before other nasal sprays    . tadalafil (CIALIS) 5 MG tablet Take 5 mg by mouth every evening.     . triamcinolone cream (KENALOG) 0.1 % APPLY TOPICALLY 4 TIMES A DAY (Patient taking differently: APPLY TOPICALLY 2 TIMES A DAY AS NEEDED for itching) 30 g 1  . trospium (SANCTURA) 20 MG tablet Take 20 mg by mouth 2 (two) times daily.   2  . ZENPEP 25000-79000 units CPEP Take 2 capsules by mouth 3 (three) times daily.  0   Current Facility-Administered Medications on File Prior to Visit  Medication Dose Route Frequency Provider Last Rate Last Dose  . Mepolizumab SOLR 100 mg  100 mg Subcutaneous Q28 days [name], MD   100 mg at 03/12/17 1528    Past Medical History:  Diagnosis Date  . Anesthesia complication requiring reversal agent administration    ? from central apnea, very difficult to get off vent  . Anxiety   . Arthritis    osteo  . Asthma   . BPH (benign prostatic hyperplasia)   . Complication of anesthesia    difficulty waking , they twlight me because of my respiratory problems "  . Dyspnea   . Enlarged heart   . Family history of adverse reaction to anesthesia    mother trouble waking up, and heart stopped    . GERD (gastroesophageal reflux disease)   . Headache    botox injections for headaches  . Hyperlipidemia   . Hypertension   . Hypogonadism male   . IBS (irritable bowel syndrome)   . Obesity   . OSA (obstructive sleep apnea)    cpap  . Pneumonia   . Pre-diabetes   . Prostatitis     Past Surgical History:  Procedure Laterality Date  . ABDOMINAL SURGERY    . ANKLE FRACTURE SURGERY Right   . CYSTOSCOPY     [place]  . KNEE ARTHROSCOPY WITH MEDIAL MENISECTOMY Left 01/02/2017   Procedure: LEFT KNEE ARTHROSCOPY WITH PARTIAL MEDIAL MENISCECTOMY;  Surgeon: Mcarthur Rossetti, MD;  Location: [facility];  Service: Orthopedics;  Laterality: Left;  . TONSILLECTOMY    . [place] RESECTION  2007  . UVULOPALATOPHARYNGOPLASTY      Family History  Problem Relation Age of Onset  . Diabetes Paternal Uncle   . Cancer Father        lymphoma, colon  . Diabetes Maternal Grandmother   . Heart disease Maternal Grandfather   . Diabetes Maternal Grandfather   . Diabetes Paternal Grandmother   . Diabetes Paternal Grandfather   . Dementia Mother   . Prostate cancer Maternal Uncle   . Lung disease Neg Hx   . Rheumatologic disease Neg Hx     Social History   Socioeconomic History  . Marital status: Single    Spouse name: Not on file  . Number of children: Not on file  . Years of education: Not on file  . Highest education level: Not on file  Social Needs  . Financial resource strain: Not on file  . Food insecurity - worry: Not on file  . Food insecurity - inability: Not on file  . Transportation needs - medical: Not on file  . Transportation needs - non-medical: Not on file  Occupational History  . Occupation: Dance movement psychotherapist  Tobacco Use  . Smoking status: Never Smoker  . Smokeless tobacco: Never Used  . Tobacco comment: significant second-hand exposure through mother  Substance and Sexual Activity  . Alcohol use: Yes    Alcohol/week: 0.6 oz    Types: 1 Glasses of wine  per week    Comment: 2 x a year  . Drug use: No  . Sexual activity: No  Other Topics Concern  . Not on file  Social History Narrative   [facility]:   Originally from [place]. Previously has lived in [place]. He has lived in [place], [place], & [place]. He has worked in Engineer, production. No pets currently. Brief exposure to a roommates bird Secretary/administrator) in college. No mold, asbestos, or hot tub exposure.       Objective:   Physical Exam BP 134/78 (BP Location: Left Arm, Cuff Size: Normal)   Pulse 94   Ht 6' (1.829 m)   Wt (!) 388 lb 9.6 oz (176.3 kg)   SpO2 94%   BMI 52.70 kg/m   General:  Awake. Morbidly obese. Comfortable. Integument:  No rash on exposed skin. Warm. Dry. Extremities:  No cyanosis or clubbing.  HEENT:  No scleral icterus. Moist mucous membranes. Minimal nasal turbinate swelling Cardiovascular:  Regular rate. No edema. Regular rhythm.  Pulmonary:  Good aeration bilaterally & clear with auscultation. Normal work of breathing on room air. Abdomen: Soft. Normal bowel sounds. Protuberant. Musculoskeletal:  Normal bulk and tone. No joint deformity or effusion appreciated. Neurological:  Cranial nerves 2-12 grossly in tact. No meningismus. Moving all 4 extremities equally.   PFT 04/21/16: FVC 3.47 L (66%) FEV1 2.58 L (64%) FEV1/FVC 0.74 FEF 25-75 1.95 L (57%) negative bronchodilator response TLC 6.21 L (84%) RV 96% ERV 15% DLCO uncorrected 95% 03/28/16: FVC 4.03 L (80%) FEV1 2.80 L (69%) FEV1/FVC 0.69 FEF 25-75 1.62 L (40%) negative bronchodilator response  6MWT 03/09/17:  Walked 2 laps / Baseline Sat 97% on RA / Nadir Sat 97% on RA (walked at a slow pace with a cane - stopped due to dyspnea) 05/08/16:  Walked 321 meters / Baseline Sat 93% on RA / Nadir Sat 93% on RA @ rest  COMPLIANCE DOWNLOAD 06/03 - 11/24/16:  7% compliance with >/= 4 hours of use. Used 5 days in last 30. Residual AHI 3.2 events/hr. Maximum pressure 12.8 cm H2O.   SPLIT NIGHT SLEEP  STUDY (05/07/16): Severe obstructive sleep apnea with AHI 65.7 events/hour. No significant central sleep apnea occurred. Severe desaturation during diagnostic portion to 72% on room air. No cardiac abnormalities or periodic limb movements were noted. Optimal CPAP pressure was 18 cm H2O with a large-sized [employer] Full Face Mask Simplus mask. Reading physician recommended AutoPap at 10-18 cm H2O pressure.  IMAGING CXR PA/LAT 04/09/17 (personally reviewed by me):  No residual opacity. No pleural effusion. Cardiomegaly noted. Mediastinal contour are normal in size.  CXR PA/LAT 02/15/17 (previously reviewed by me):  No pleural effusion appreciated. Right upper and lower lobe opacities that are patchy and appear alveolar. No obvious air bronchograms to suggest consolidation. Heart normal in contour & mediastinum normal in contour. Cardiomegaly noted.  ESOPHAGRAM/BARIUM SWALLOW 04/15/16 (per radiologist): Mild nonspecific esophageal dysmotility disorder. Episode of flash laryngeal penetration with swallowing. Small sliding type I hiatal hernia. No visible mass, erosion, or ulcerations.  CXR PA/LAT 02/13/16 (previously reviewed by me): No focal opacity or effusion appreciated. No mass appreciated. Heart borderline normal in size. Mediastinum normal in contour. Low lung volumes.  MICROBIOLOGY Sputum Culture 02/16/17:  Normal [place]   LABS 04/10/16 Alpha-1 antitrypsin: MM (135)  03/28/16 CBC: 11.8/14.2/43.0/309 Eosinophils:  354 IgG: 583 IgA: 154 IgM: 46 IgE: 92  11/02/13 ANA:  Negative DS DNA Ab:  <1 SSA:  <1.0 SSB:  <1.0  02/24/11 ACE:  35 RF:  <10    Assessment & Plan:  56 y.o. male with ongoing productive cough. I reviewed his chest x-ray today which shows no residual opacities to suggest residual pneumonia or ongoing infection. Certainly his productive cough could be due to another process and I suspect that given his poor control of his underlying reflux this is likely the culprit.  Overall, his asthma seems to be reasonably well controlled. With his ongoing cough he has been unable to resume his CPAP therapy. I instructed the patient to contact my office if he had questions or concerns before his next appointment. He will finish out his current course of antibiotic therapy.  1. Pneumonia: Resolved. 2. Acute hypoxic respiratory failure: Plan to reassess at next appointment. 3. Mixed type COPD: Checking sputum culture for AFB, fungus, and bacteria. Increasing gastric acid suppression. Continuing treatment with Symbicort, Singulair, and Nucala. 4. OSA: Patient to resume CPAP therapy as soon as he is able. 5. GERD: Continuing Nexium twice a day. Starting Zantac 150 mg by mouth daily at bedtime. 6. Chronic allergic rhinitis: Continuing Singulair & Nucala. Further treatment as per allergy/immunology.  7. Health maintenance: Status post Flu Vaccine October 2018, Pneumovax November 2017, Prevnar June 2016, & Tdap Harrell 2015.  8. Follow-up: Return to clinic in 6 weeks or sooner if needed.  Sonia Baller Ashok Cordia, M.D. [facility] Pager:  [phone] After 3pm or if no response, call [phone] 3:50 PM 04/09/17

## 2017-04-09 NOTE — Patient Instructions (Addendum)
   Continue using your medications as prescribed.  We will check a culture of your sputum. Remember the specimen needs to be delivered within 4 hours, should not be refrigerated, and early morning specimens are the best.  Keep taking your Nexium and we are adding Zantac at night before bed to your regimen to help control your reflux.   Call us if you have any questions or concerns.   TESTS ORDERED: 1. Sputum Culture Routine, AFB, & Fungus.

## 2017-04-10 ENCOUNTER — Other Ambulatory Visit: Payer: BLUE CROSS/BLUE SHIELD

## 2017-04-10 ENCOUNTER — Ambulatory Visit (INDEPENDENT_AMBULATORY_CARE_PROVIDER_SITE_OTHER): Payer: BLUE CROSS/BLUE SHIELD

## 2017-04-10 ENCOUNTER — Other Ambulatory Visit: Payer: Self-pay | Admitting: Physician Assistant

## 2017-04-10 DIAGNOSIS — J4550 Severe persistent asthma, uncomplicated: Secondary | ICD-10-CM | POA: Diagnosis not present

## 2017-04-10 DIAGNOSIS — K219 Gastro-esophageal reflux disease without esophagitis: Secondary | ICD-10-CM | POA: Diagnosis not present

## 2017-04-10 DIAGNOSIS — J449 Chronic obstructive pulmonary disease, unspecified: Secondary | ICD-10-CM

## 2017-04-13 ENCOUNTER — Encounter (INDEPENDENT_AMBULATORY_CARE_PROVIDER_SITE_OTHER): Payer: Self-pay | Admitting: Orthopaedic Surgery

## 2017-04-13 ENCOUNTER — Ambulatory Visit (INDEPENDENT_AMBULATORY_CARE_PROVIDER_SITE_OTHER): Payer: BLUE CROSS/BLUE SHIELD | Admitting: Physician Assistant

## 2017-04-13 DIAGNOSIS — J4550 Severe persistent asthma, uncomplicated: Secondary | ICD-10-CM | POA: Diagnosis not present

## 2017-04-13 DIAGNOSIS — M542 Cervicalgia: Secondary | ICD-10-CM | POA: Diagnosis not present

## 2017-04-13 LAB — RESPIRATORY CULTURE OR RESPIRATORY AND SPUTUM CULTURE
MICRO NUMBER:: 81295764
RESULT:: NORMAL
SPECIMEN QUALITY:: ADEQUATE

## 2017-04-13 NOTE — Progress Notes (Signed)
Mark Harrell returns today for follow-up of his left upper extremity pain.  Continues to have some night pain.  He underwent an MRI of his cervical spine is here to review this.  States he has numbness in his left thumb index and slightly in the long finger.  No pain down the right arm.  States lies flat he has increased pain down the left arm.  MRI images have personally reviewed.  The MRI report is reviewed with the patient today that showed a right paracentral disc osteophyte complex at C5-C6 compressing the spinal cord and exiting right C6 nerve root.  Mild to moderate central canal and moderately severe bilateral foraminal stenosis at C6-C7  Physical exam: General well-developed well-nourished male in no acute distress.. Subjective decreased sensation left hand involving the index and long finger.  Radial pulses are intact bilaterally.  Positive Tinel's over the left median nerve at the wrist.  Impression:Cervical allergies with radicular symptoms left arm  Plan: We will send him for cervical epidural steroid injection with Dr. Ernestina Patches.  Follow-up with Dr. [name] in 1 month to check his progress or lack.

## 2017-04-13 NOTE — Telephone Encounter (Signed)
I had left 2 messages for patient but have not gotten call back.  I have his chart out still and will try again to reach out to patient.

## 2017-04-14 ENCOUNTER — Ambulatory Visit: Payer: BLUE CROSS/BLUE SHIELD | Admitting: Internal Medicine

## 2017-04-14 ENCOUNTER — Encounter: Payer: Self-pay | Admitting: Internal Medicine

## 2017-04-14 VITALS — BP 124/84 | HR 88 | Temp 97.3°F | Resp 20 | Ht 72.0 in | Wt 377.4 lb

## 2017-04-14 DIAGNOSIS — F6810 Factitious disorder, unspecified: Secondary | ICD-10-CM | POA: Insufficient documentation

## 2017-04-14 DIAGNOSIS — Z79899 Other long term (current) drug therapy: Secondary | ICD-10-CM

## 2017-04-14 DIAGNOSIS — E559 Vitamin D deficiency, unspecified: Secondary | ICD-10-CM

## 2017-04-14 DIAGNOSIS — E782 Mixed hyperlipidemia: Secondary | ICD-10-CM | POA: Diagnosis not present

## 2017-04-14 DIAGNOSIS — E039 Hypothyroidism, unspecified: Secondary | ICD-10-CM

## 2017-04-14 DIAGNOSIS — R7303 Prediabetes: Secondary | ICD-10-CM

## 2017-04-14 DIAGNOSIS — I10 Essential (primary) hypertension: Secondary | ICD-10-CM | POA: Diagnosis not present
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
## 2017-04-14 NOTE — Patient Instructions (Signed)

## 2017-04-14 NOTE — Telephone Encounter (Signed)
I will keep eye out for them also

## 2017-04-14 NOTE — Addendum Note (Signed)
Addended by: [name] on: 04/14/2017 08:16 AM   Modules accepted: Orders

## 2017-04-14 NOTE — Telephone Encounter (Signed)
Orders placed for additional labs. I did text the patient to remind him to come in for these labs.   [name], MD [facility] [place]

## 2017-04-15 LAB — INSULIN, RANDOM: Insulin: 34.9 u[IU]/mL — ABNORMAL HIGH (ref 2.0–19.6)

## 2017-04-15 LAB — CBC WITH DIFFERENTIAL/PLATELET
BASOS ABS: 88 {cells}/uL (ref 0–200)
Basophils Relative: 0.7 %
EOS PCT: 2.3 %
Eosinophils Absolute: 288 cells/uL (ref 15–500)
HEMATOCRIT: 45.1 % (ref 38.5–50.0)
Hemoglobin: 14.5 g/dL (ref 13.2–17.1)
LYMPHS ABS: 4500 {cells}/uL — AB (ref 850–3900)
MCH: 28.1 pg (ref 27.0–33.0)
MCHC: 32.2 g/dL (ref 32.0–36.0)
MCV: 87.4 fL (ref 80.0–100.0)
MPV: 10 fL (ref 7.5–12.5)
Monocytes Relative: 10.3 %
NEUTROS PCT: 50.7 %
Neutro Abs: 6338 cells/uL (ref 1500–7800)
Platelets: 276 10*3/uL (ref 140–400)
RBC: 5.16 10*6/uL (ref 4.20–5.80)
RDW: 13.2 % (ref 11.0–15.0)
Total Lymphocyte: 36 %
WBC mixed population: 1288 cells/uL — ABNORMAL HIGH (ref 200–950)
WBC: 12.5 10*3/uL — AB (ref 3.8–10.8)

## 2017-04-15 LAB — BASIC METABOLIC PANEL WITH GFR
BUN: 15 mg/dL (ref 7–25)
CO2: 31 mmol/L (ref 20–32)
Calcium: 8.4 mg/dL — ABNORMAL LOW (ref 8.6–10.3)
Chloride: 100 mmol/L (ref 98–110)
Creat: 0.95 mg/dL (ref 0.70–1.33)
GFR, EST AFRICAN AMERICAN: 103 mL/min/{1.73_m2} (ref >=60)
GFR, EST NON AFRICAN AMERICAN: 89 mL/min/{1.73_m2} (ref >=60)
Glucose, Bld: 104 mg/dL — ABNORMAL HIGH (ref 65–99)
POTASSIUM: 3.9 mmol/L (ref 3.5–5.3)
SODIUM: 140 mmol/L (ref 135–146)

## 2017-04-15 LAB — MAGNESIUM: Magnesium: 1.9 mg/dL (ref 1.5–2.5)

## 2017-04-15 LAB — HEPATIC FUNCTION PANEL
AG Ratio: 1.7 (calc) (ref 1.0–2.5)
ALKALINE PHOSPHATASE (APISO): 63 U/L (ref 40–115)
ALT: 36 U/L (ref 9–46)
AST: 22 U/L (ref 10–35)
Albumin: 3.5 g/dL — ABNORMAL LOW (ref 3.6–5.1)
BILIRUBIN DIRECT: 0.1 mg/dL (ref 0.0–0.2)
BILIRUBIN INDIRECT: 0.5 mg/dL (ref 0.2–1.2)
BILIRUBIN TOTAL: 0.6 mg/dL (ref 0.2–1.2)
Globulin: 2.1 g/dL (calc) (ref 1.9–3.7)
Total Protein: 5.6 g/dL — ABNORMAL LOW (ref 6.1–8.1)

## 2017-04-15 LAB — LIPID PANEL
Cholesterol: 122 mg/dL (ref <200)
HDL: 26 mg/dL — ABNORMAL LOW (ref >40)
LDL Cholesterol (Calc): 64 mg/dL (calc)
NON-HDL CHOLESTEROL (CALC): 96 mg/dL (ref <130)
Total CHOL/HDL Ratio: 4.7 (calc) (ref <5.0)
Triglycerides: 308 mg/dL — ABNORMAL HIGH (ref <150)

## 2017-04-15 LAB — HEMOGLOBIN A1C
EAG (MMOL/L): 7.1 (calc)
HEMOGLOBIN A1C: 6.1 %{Hb} — AB (ref <5.7)
MEAN PLASMA GLUCOSE: 128 (calc)

## 2017-04-15 LAB — VITAMIN D 25 HYDROXY (VIT D DEFICIENCY, FRACTURES): Vit D, 25-Hydroxy: 74 ng/mL (ref 30–100)

## 2017-04-15 LAB — TSH: TSH: 1.68 m[IU]/L (ref 0.40–4.50)

## 2017-04-20 ENCOUNTER — Encounter (INDEPENDENT_AMBULATORY_CARE_PROVIDER_SITE_OTHER): Payer: Self-pay | Admitting: Physical Medicine and Rehabilitation

## 2017-04-20 ENCOUNTER — Ambulatory Visit (INDEPENDENT_AMBULATORY_CARE_PROVIDER_SITE_OTHER): Payer: Self-pay

## 2017-04-20 ENCOUNTER — Ambulatory Visit (INDEPENDENT_AMBULATORY_CARE_PROVIDER_SITE_OTHER): Payer: BLUE CROSS/BLUE SHIELD | Admitting: Physical Medicine and Rehabilitation

## 2017-04-20 VITALS — Temp 99.0°F

## 2017-04-20 DIAGNOSIS — M4802 Spinal stenosis, cervical region: Secondary | ICD-10-CM | POA: Diagnosis not present

## 2017-04-20 DIAGNOSIS — M5412 Radiculopathy, cervical region: Secondary | ICD-10-CM

## 2017-04-20 DIAGNOSIS — G4733 Obstructive sleep apnea (adult) (pediatric): Secondary | ICD-10-CM | POA: Diagnosis not present

## 2017-04-20 MED ORDER — LIDOCAINE HCL (PF) 1 % IJ SOLN
2.0000 mL | Freq: Once | INTRAMUSCULAR | Status: AC
  Administered 2017-04-20: 2 mL

## 2017-04-20 MED ORDER — BETAMETHASONE SOD PHOS & ACET 6 (3-3) MG/ML IJ SUSP
12.0000 mg | Freq: Once | INTRAMUSCULAR | Status: AC
  Administered 2017-04-20: 12 mg

## 2017-04-20 NOTE — Patient Instructions (Signed)

## 2017-04-20 NOTE — Progress Notes (Deleted)
Here for cervical injection, pt is hurting shoulder Left  And left arm and hand and first 3 digits. Having some numbness and tingling in the arm and hands. Pain increase in neck if turns the wrong way and laying down in certain positions. Pt is in pain now, pain scale 8/10 now. Does have driver, no allergy to contrast dye. Pt is not on any blood thinners.

## 2017-04-21 NOTE — Procedures (Signed)
Mark Harrell is a 56 year old right-hand-dominant followed by Mark Harrell and Mark Harrell in our office.  They kindly sent him here today for possible cervical epidural injection.  He is having left shoulder pain that is worse with movement but also pain down the left arm mostly in a dorsal fashion into the first 3 digits.  He does get paresthesias.  Does increase if he moves his neck a certain way or lays in certain positions.  He is a very complicated patient.  He is morbidly obese.  He has multiple gated by depression as well as seizure disorder medications.  He sees Dr. Wilford Grist chronic pain management of his lower back.  Is having an injection in his lower back 2 weeks from now.  He did discuss this with Dr. Wilford Grist and evidently there was no issue with doing the injection of the cervical spine if needed.  I do not know the status of any electrodiagnostic studies although I know Dr. Wilford Grist would be looking at that as well for his upper extremity.  His cervical spine is significant at C5-6 4 moderate central canal stenosis and disc herniation chiefly more on the right side.  Again he is having left-sided complaints.  He denies any right-sided radicular complaints.  He is very focused on the fact that he thought the report showed degenerative disc.  I tried explained to him the pathology at C5-6 injection may help him complete the injection today.  He will talk to Dr. Wilford Grist next week about the possibility of just having most of his care form there.  He does get complicated with multiple providers looking at his spine.  The injection  will be diagnostic and hopefully therapeutic. The patient has failed conservative care including time, medications and activity modification.  Cervical Epidural Steroid Injection - Interlaminar Approach with Fluoroscopic Guidance  Patient: Mark Harrell      Date of Birth: May 28, 1960 MRN: [id number] PCP: [name], MD      Visit Date: 04/20/2017   Universal Protocol:     Date/Time: 11/27/186:18 AM  Consent Given By: the patient  Position: PRONE  Additional Comments: Vital signs were monitored before and after the procedure. Patient was prepped and draped in the usual sterile fashion. The correct patient, procedure, and site was verified.   Injection Procedure Details:  Procedure Site One Meds Administered:  Meds ordered this encounter  Medications  . lidocaine (PF) (XYLOCAINE) 1 % injection 2 mL  . betamethasone acetate-betamethasone sodium phosphate (CELESTONE) injection 12 mg     Laterality: Left  Location/Site:  C7-T1  Needle size: 20 G  Needle type: Touhy  Needle Placement: Paramedian epidural space  Findings:  -Contrast Used: 1 mL iohexol 180 mg iodine/mL   -Comments: Excellent flow of contrast into the epidural space.  Procedure Details: Using a paramedian approach from the side mentioned above, the region overlying the inferior lamina was localized under fluoroscopic visualization and the soft tissues overlying this structure were infiltrated with 4 ml. of 1% Lidocaine without Epinephrine. A # 20 gauge, Tuohy needle was inserted into the epidural space using a paramedian approach.  The epidural space was localized using loss of resistance along with lateral and contralateral oblique bi-planar fluoroscopic views.  After negative aspirate for air, blood, and CSF, a 2 ml. volume of Isovue-250 was injected into the epidural space and the flow of contrast was observed. Radiographs were obtained for documentation purposes.   The injectate was administered into the level noted above.  Additional Comments:  The patient tolerated the procedure well Dressing: Band-Aid    Post-procedure details: Patient was observed during the procedure. Post-procedure instructions were reviewed.  Patient left the clinic in stable condition.

## 2017-04-22 DIAGNOSIS — K648 Other hemorrhoids: Secondary | ICD-10-CM | POA: Diagnosis not present

## 2017-04-22 DIAGNOSIS — Z6841 Body Mass Index (BMI) 40.0 and over, adult: Secondary | ICD-10-CM | POA: Diagnosis not present

## 2017-04-23 ENCOUNTER — Ambulatory Visit: Payer: BLUE CROSS/BLUE SHIELD | Admitting: Allergy & Immunology

## 2017-04-23 ENCOUNTER — Encounter: Payer: Self-pay | Admitting: Allergy & Immunology

## 2017-04-23 VITALS — BP 144/88 | HR 96 | Resp 20

## 2017-04-23 DIAGNOSIS — B999 Unspecified infectious disease: Secondary | ICD-10-CM | POA: Diagnosis not present

## 2017-04-23 DIAGNOSIS — J449 Chronic obstructive pulmonary disease, unspecified: Secondary | ICD-10-CM | POA: Diagnosis not present

## 2017-04-23 DIAGNOSIS — J302 Other seasonal allergic rhinitis: Secondary | ICD-10-CM

## 2017-04-23 DIAGNOSIS — J3089 Other allergic rhinitis: Secondary | ICD-10-CM | POA: Diagnosis not present

## 2017-04-23 MED ORDER — TIOTROPIUM BROMIDE MONOHYDRATE 2.5 MCG/ACT IN AERS: INHALATION_SPRAY | RESPIRATORY_TRACT | 3 refills | Status: DC

## 2017-04-23 MED ORDER — IPRATROPIUM BROMIDE 0.06 % NA SOLN: 2.0000 | Freq: Three times a day (TID) | NASAL | 5 refills | Status: DC

## 2017-04-23 NOTE — Progress Notes (Signed)
FOLLOW UP  Date of Service/Encounter:  04/23/17   Assessment:   Non-seasonal allergic rhinitis (sweet vernal grass, box elder, cat, weeds, ragweed, molds, cockroach, dust mite) - on allergen immunotherapy  Recurrent infections  Asthma-COPD overlap syndrome - changing to Dupixent  Hymenoptera allergy  GERD - on Nexium and ranitidine  Obesity - with resulting nerve impingement and joint pain  Migraines - on botulinum and Emgality injections  Plan/Recommendations:   1. Recurrent infections - with isolated low IgG - We are going to get some more labs today to see if we can get the immunoglobulin approved. - We will call you with the results of these.  - We will likely have to do a peer-to-peer evaluation to get this approved.   2. Asthma-COPD overlap syndrome - Lung function looked slightly worse today.  - Since the Nucala does not seem to be working as well, we will change you to Dupixent (injections every two weeks). - We will work with Mark Harrell to get this approved.  - We will add on Spiriva 2.29mcg one puff once daily (this should be approved since he does have a diagnosis of COPD in addition to asthma). - If there is no improvement on this regimen, we will try changing to Trelegy.  - Daily controller medication(s): Symbicort 160/4.5 two puffs twice daily with spacer + Spiriva 2.10mcg one puff once daily - Rescue medications: ProAir 4 puffs every 4-6 hours as needed or albuterol nebulizer one vial puffs every 4-6 hours as needed - Asthma control goals:  * Full participation in all desired activities (may need albuterol before activity) * Albuterol use two time or less a week on average (not counting use with activity) * Cough interfering with sleep two time or less a month * Oral steroids no more than once a year * No hospitalizations  3. Chronic allergic rhinitis (sweet vernal grass, box elder, cat, weeds, ragweed, molds, cockroach, dust mite) - Continue with Dymista 2  sprays per nostril 1-2 times daily. - Continue with saline mist 1-2 times daily.  - Add on nasal ipratropium (Atrovent) one spray per nostril every 6 hours as needed. - Samples of Karbinal ER provided (instructed to start at 7.11mL and increasing up to 83mL nightly). - Use cetirizine 10mg  daily as needed for breakthrough symptoms. - Continue with allergy shots at the same schedule.    4. Return in about 3 months (around 07/23/2017).  Subjective:   Mark Harrell is a 56 y.o. male presenting today for follow up of  Chief Complaint  Patient presents with  . Asthma    doing ok; getting tired    Mark Harrell has a history of the following: Patient Active Problem List   Diagnosis Date Noted  . 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 Cervicalgia 04/01/2017  . Acute respiratory failure with hypoxia ([place]) 01/04/2017  . Headache 11/13/2016  . Migraine without aura and without status migrainosus, not intractable 10/30/2016  . Irritant contact dermatitis due to frequent handwashing 06/08/2016  . COPD mixed type ([place]) 05/29/2016  . DM (diabetes mellitus) ([place]) 05/29/2016  . Lumbar radiculopathy 05/15/2016  . Memory change 05/15/2016  . Osteoarthritis of spine with radiculopathy, lumbar region 05/15/2016  . Risk for falls 05/15/2016  . Acute medial meniscal tear, left, subsequent encounter 04/23/2016  . Recurrent infections 03/29/2016  . Chronic nonseasonal allergic rhinitis due to pollen 03/29/2016  . Glucose intolerance (impaired glucose tolerance) 01/16/2016  . Polypharmacy 01/16/2016  . BMI 47.46,   adult ([place])  03/06/2015  . SDAT 02/05/2015  . Asthma 02/05/2015  . OSA and COPD overlap syndrome ([place]) 02/05/2015  . Medication management 08/02/2014  . GERD (gastroesophageal reflux disease) 05/09/2014  . Obesity, Class III, BMI 40-49.9 (morbid obesity) ([place]) 02/01/2014  . Vitamin D deficiency 08/01/2013  . Prediabetes 08/01/2013  . 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 Diverticula of colon  05/07/2011  . Hypertension 01/31/2011  . Hyperlipidemia, mixed 01/31/2011  . BPH (benign prostatic hyperplasia) 01/31/2011  . Testosterone Deficiency 01/31/2011  . IBS (irritable bowel syndrome) 01/31/2011  . Partial complex seizure disorder with intractable epilepsy ([place]) 01/31/2011  . Depression, major, recurrent, in partial remission ([place]) 01/31/2011  . COPD with asthma ([place]) 01/31/2011    History obtained from: chart review and patient.  Mark Harrell's Primary Care Provider is [name], MD.     Mark Harrell is a 56 y.o. male presenting for a follow up visit. He has a very complicated past medical history including recurrent sepsis requiring hospitalizations. He was most recently hospitalized in August 2018 and then again in September 2018. Prior to that, he was hospitalized in January 2018. He has a history of severe persistent asthma as well as allergic rhinitis. We added Nucala in Harrell 2018 which seems to have stabilized his asthma initially, although lately his asthma has been under worse control. We have done an immune workup in the past which was largely normal aside from a slightly low IgG which was felt to be secondary to chronic prednisone use. We did try to obtain coverage for Hizentra, however it was denied. We have not gone through with a peer-to-peer since we were hoping that the addition of Nucala would improve his health status, which was working initially.   Since the last visit, he has remained fairly stable. He was hospitalized for aspiration pneumonia in September 2018. He was on antibiotics as well as prednisone for an extended course. Breathing is otherwise doing well. He is decreasing his use of the albuterol lately, estimates around twice per week. He remains on Symbicort two puffs twice daily. He is really unsure whether the Nucala has helped. It did seem that it helped in the beginning but since then it does not seem that it has done much at this point. He started it in  Harrell 2018 and has been getting this regularly. He is unsure whether he has ever had Spiriva at all, and he has never had Trelegy.   Mark Harrell did have ranitidine added to his regimen in addition to Nexium. They are not planning to perform a barium swallow study since this has already been done, but he did add a wedge to his bed to help with reflux precautions. He is under the care of Mark Harrell, who unfortunately is leaving the practice. He is changing to Mark Harrell at [facility] (next appt December 19th).   He continues to have nasal congestion at night. He also has periodic drainage. He remains on his Dymista. He does endorse intermittent acidic postnasal drip, although most of the time it is not acidic. He has not tried taking any other medications to help with this, although he is interested in seeing if the Mark Harrell could help. Samples will be provided today.   Mark Harrell is on allergen immunotherapy, although he has not had an allergy shot since September 2018. He has held off since he has been so sick, but he did restart today. He receives two injections. Immunotherapy script #1 contains molds and cockroach. He currently  receives 0.51mL of the BLUE vial (1/100,000). Immunotherapy script #2 contains trees, weeds, grasses, dust mites and cat. He currently receives 0.83mL of the BLUE vial (1/100,000). He started shots July of 2018 and not yet reached maintenance.   Mark Harrell continues to have a multitude of other chronic medical problems. He did undergo steroid injections in his back and neck due to chronic pain. He has a history of migraines and recently started on Emgality injections; this is a once monthly injection given at home. He has only done one injection at this point. He has this covered for one year. He did have the botox injections as well, which have helped "enormously".   He was looking into an intensive weight loss program at [facility] as well as here at [facility]. He also was recently  treated for an internal hemorrhoid with a banding procedure, but this is going to take one more surgery to resolve completely. He is trying to get all of his medical problems stabilized before he goes into this weight loss program. He does anticipate needing to do a LapBand at some point.   Otherwise, there have been no changes to his past medical history, surgical history, family history, or social history.    Review of Systems: a 14-point review of systems is pertinent for what is mentioned in HPI.  Otherwise, all other systems were negative. Constitutional: negative other than that listed in the HPI Eyes: negative other than that listed in the HPI Ears, nose, mouth, throat, and face: negative other than that listed in the HPI Respiratory: negative other than that listed in the HPI Cardiovascular: negative other than that listed in the HPI Gastrointestinal: negative other than that listed in the HPI Genitourinary: negative other than that listed in the HPI Integument: negative other than that listed in the HPI Hematologic: negative other than that listed in the HPI Musculoskeletal: negative other than that listed in the HPI Neurological: negative other than that listed in the HPI Allergy/Immunologic: negative other than that listed in the HPI    Objective:   Blood pressure (!) 144/88, pulse 96, resp. rate 20. There is no height or weight on file to calculate BMI.   Physical Exam:  General: Alert, interactive, in no acute distress. Some sullen today, but still interactive. Walking with the aid of cane.  Eyes: No conjunctival injection bilaterally, no discharge on the right, no discharge on the left, no Horner-Trantas dots present and allergic shiners present bilaterally. PERRL bilaterally. EOMI without pain. No photophobia.  Ears: Right TM pearly gray with normal light reflex, Left TM pearly gray with normal light reflex, Right TM intact without perforation and Left TM intact without  perforation.  Nose/Throat: External nose within normal limits, nasal crease present and septum midline. Turbinates edematous and pale with clear discharge. Posterior oropharynx erythematous without cobblestoning in the posterior oropharynx. Tonsils 2+ without exudates.  Tongue without thrush. Adenopathy: shoddy bilateral anterior cervical lymphadenopathy and no enlarged lymph nodes appreciated in the occipital, axillary, epitrochlear, inguinal, or popliteal regions. Lungs: Decreased breath sounds bilaterally without wheezing, rhonchi or rales. No increased work of breathing. CV: Normal S1/S2. No murmurs. Capillary refill <2 seconds.  Skin: Warm and dry, without lesions or rashes. Neuro:   Grossly intact. No focal deficits appreciated. Responsive to questions.  Diagnostic studies:   Spirometry: results abnormal (FEV1: 2.34/58%, FVC: 3.13/60%, FEV1/FVC: 75%).    Spirometry consistent with possible restrictive disease. Overall values are somewhat decreased compared to the previous visit.  Allergy Studies:  none      [name], MD [facility]

## 2017-04-23 NOTE — Patient Instructions (Addendum)
1. Recurrent infections - with isolated low IgG - We are going to get some more labs today to see if we can get the immunoglobulin approved. - We will call you with the results of these.  - We will likely have to do a peer-to-peer evaluation to get this approved.   2. Asthma-COPD overlap syndrome - Lung function looked slightly worse today.  - Since the Nucala does not seem to be working as well, we will change you to Dupixent (injections every two weeks). - We will work with Tammy to get this approved.  - We will add on Spiriva 2.46mcg one puff once daily - Daily controller medication(s): Symbicort 160/4.5 two puffs twice daily with spacer + Spiriva 2.53mcg one puff once daily - Rescue medications: ProAir 4 puffs every 4-6 hours as needed or albuterol nebulizer one vial puffs every 4-6 hours as needed - Asthma control goals:  * Full participation in all desired activities (may need albuterol before activity) * Albuterol use two time or less a week on average (not counting use with activity) * Cough interfering with sleep two time or less a month * Oral steroids no more than once a year * No hospitalizations  3. Chronic allergic rhinitis (sweet vernal grass, box elder, cat, weeds, ragweed, molds, cockroach, dust mite) - Continue with Dymista 2 sprays per nostril 1-2 times daily. - Continue with saline mist 1-2 times daily.  - Add on nasal ipratropium (Atrovent) one spray per nostril every 6 hours as needed.  - Use cetirizine 10mg  daily as needed for breakthrough symptoms. - Continue with allergy shots at the same schedule.    4. Return in about 3 months (around 07/23/2017).  Please inform us of any Emergency Department visits, hospitalizations, or changes in symptoms. Call us before going to the ED for breathing or allergy symptoms since we might be able to fit you in for a sick visit. Feel free to contact us anytime with any questions, problems, or concerns.  It was a pleasure to see you  again today! Good luck with getting into the weight loss program! Let us know what we can do to help.   Websites that have reliable patient information: 1. American Academy of Asthma, Allergy, and Immunology: www.aaaai.org 2. Food Allergy Research and Education (FARE): foodallergy.org 3. Mothers of Asthmatics: http://www.asthmacommunitynetwork.org 4. American College of Allergy, Asthma, and Immunology: www.acaai.org

## 2017-04-27 ENCOUNTER — Telehealth: Payer: Self-pay | Admitting: Allergy and Immunology

## 2017-04-27 NOTE — Telephone Encounter (Signed)
Already got it will start appeal process

## 2017-04-27 NOTE — Telephone Encounter (Signed)
A representative called today to state that the patient was DENIED for DUPIXENT 300mg 

## 2017-04-29 ENCOUNTER — Encounter: Payer: Self-pay | Admitting: Physician Assistant

## 2017-04-29 ENCOUNTER — Other Ambulatory Visit: Payer: Self-pay | Admitting: Allergy & Immunology

## 2017-04-29 NOTE — Telephone Encounter (Signed)
Thanks, Tammy! We are still waiting on one lab to get back, but we will probably be working on appeals for SCIG as well.  [name], MD [facility] [place] [place]

## 2017-04-30 DIAGNOSIS — M5416 Radiculopathy, lumbar region: Secondary | ICD-10-CM | POA: Diagnosis not present

## 2017-05-01 LAB — B-CELL MEMORY AND NAIVE PANEL
B-CELLS % CD19: 13 % (ref 5–26)
B-cells Absolute CD19: 497 cells/uL (ref 58–558)
CLASS-SWITCHED ABS: 11 {cells}/uL (ref 4–62)
Class-switched Memory %: 2 % — ABNORMAL LOW (ref 3–23)
IGM ONLY MEMORY %: 0.6 % (ref 0.3–6.0)
IgM Only Memory Abs: 3 cells/uL (ref 0.6–16.4)
NAIVE BCL ABS CD19+/CD27-/IGD+: 436 {cells}/uL — AB (ref 22–423)
NON-SWITCH ABS: 30 {cells}/uL (ref 4–66)
NON-SWITCHED MEMORY %: 6 % (ref 2–25)
Naive B-cell %CD19+/CD27-/IgD+: 88 % (ref 29–93)
Tot Mem BCL Absol CD19+/CD27+: 44 cells/uL (ref 13–148)
Total Memory B-cell%CD19/CD27+: 9 % (ref 7–48)

## 2017-05-01 LAB — PROTEIN ELECTROPHORESIS, SERUM
A/G RATIO SPE: 1.2 (ref 0.7–1.7)
ALBUMIN ELP: 3.1 g/dL (ref 2.9–4.4)
Alpha 1: 0.2 g/dL (ref 0.0–0.4)
Alpha 2: 0.9 g/dL (ref 0.4–1.0)
BETA: 0.9 g/dL (ref 0.7–1.3)
GAMMA GLOBULIN: 0.6 g/dL (ref 0.4–1.8)
Globulin, Total: 2.6 g/dL (ref 2.2–3.9)
TOTAL PROTEIN: 5.7 g/dL — AB (ref 6.0–8.5)

## 2017-05-01 LAB — MANNOSE BINDING LECTIN (MBL): Mannose Binding Lectin (MBL): 2892 ng/mL

## 2017-05-07 ENCOUNTER — Ambulatory Visit: Payer: Self-pay

## 2017-05-11 ENCOUNTER — Ambulatory Visit (INDEPENDENT_AMBULATORY_CARE_PROVIDER_SITE_OTHER): Payer: BLUE CROSS/BLUE SHIELD | Admitting: *Deleted

## 2017-05-11 ENCOUNTER — Ambulatory Visit (INDEPENDENT_AMBULATORY_CARE_PROVIDER_SITE_OTHER): Payer: BLUE CROSS/BLUE SHIELD | Admitting: Orthopaedic Surgery

## 2017-05-11 ENCOUNTER — Ambulatory Visit (INDEPENDENT_AMBULATORY_CARE_PROVIDER_SITE_OTHER): Payer: BLUE CROSS/BLUE SHIELD

## 2017-05-11 DIAGNOSIS — G8929 Other chronic pain: Secondary | ICD-10-CM

## 2017-05-11 DIAGNOSIS — J4550 Severe persistent asthma, uncomplicated: Secondary | ICD-10-CM

## 2017-05-11 DIAGNOSIS — M25512 Pain in left shoulder: Secondary | ICD-10-CM

## 2017-05-11 MED ORDER — METHYLPREDNISOLONE ACETATE 40 MG/ML IJ SUSP
40.0000 mg | INTRAMUSCULAR | Status: AC | PRN
  Administered 2017-05-11: 40 mg via INTRA_ARTICULAR

## 2017-05-11 MED ORDER — LIDOCAINE HCL 1 % IJ SOLN
3.0000 mL | INTRAMUSCULAR | Status: AC | PRN
  Administered 2017-05-11: 3 mL

## 2017-05-11 NOTE — Progress Notes (Signed)
Office Visit Note   Patient: Mark Harrell           Date of Birth: 10/05/1960           MRN: [id number] Visit Date: 05/11/2017              Requested by: [name], [address] [place], [place] [id number] PCP: [name], MD   Assessment & Plan: Visit Diagnoses:  1. Chronic left shoulder pain     Plan: I met also what else I can try for Mark Harrell.  He may benefit from chronic pain management or even a TENS unit.  He will see his neurologist about his sleep issues.  I did recommend a steroid injection in the subacromial space of his left shoulder and tolerated this well.  There is nothing else that I recommend at this point and he is not a surgical candidate nor do I think he needs any surgery.  He will follow-up as needed.  Follow-Up Instructions: Return if symptoms worsen or fail to improve.   Orders:  Orders Placed This Encounter  Procedures  . XR Shoulder Left   No orders of the defined types were placed in this encounter.     Procedures: Large Joint Inj: L subacromial bursa on 05/11/2017 3:08 PM Indications: pain and diagnostic evaluation Details: 22 G 1.5 in needle  Arthrogram: No  Medications: 3 mL lidocaine 1 %; 40 mg methylPREDNISolone acetate 40 MG/ML Outcome: tolerated well, no immediate complications Procedure, treatment alternatives, risks and benefits explained, specific risks discussed. Consent was given by the patient. Immediately prior to procedure a time out was called to verify the correct patient, procedure, equipment, support staff and site/side marked as required. Patient was prepped and draped in the usual sterile fashion.       Clinical Data: No additional findings.   Subjective: No chief complaint on file. The patient comes for continued follow-up.  He tried a steroid injection by Dr. Ernestina Patches his neck and he said he hoped that was done better but he still has multiple things going on his left shoulder seems to  be quite a big problem for him.  Question was a pinched nerve in his arm.  This is all chronic situation for him.  Not sure what else I can offer.  He is morbidly obese.  Is not getting good sleep at night. HPI  Review of Systems   Objective: Vital Signs: There were no vitals taken for this visit.  Physical Exam He is alert and oriented x3 and in no acute distress he is a morbidly obese individual.  His left shoulder exam shows pain but otherwise is functionally normal. Ortho Exam I had him lay supine and I can easily palpate a pulse in his wrist and hand even though he says his arm goes asleep with him lay supine.  Again he is morbidly obese.  I do not find anything grossly wrong on exam with him other than his pain. Specialty Comments:  No specialty comments available.  Imaging: Xr Shoulder Left  Result Date: 05/11/2017 3 views of the left shoulder show well located shoulder with no acute findings.    PMFS History: Patient Active Problem List   Diagnosis Date Noted  . Seasonal and perennial allergic rhinitis 04/23/2017  . 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 Cervicalgia 04/01/2017  . Acute respiratory failure with hypoxia ([place]) 01/04/2017  . Headache 11/13/2016  . Migraine without aura and without status migrainosus, not intractable  10/30/2016  . Irritant contact dermatitis due to frequent handwashing 06/08/2016  . COPD mixed type ([place]) 05/29/2016  . DM (diabetes mellitus) ([place]) 05/29/2016  . Lumbar radiculopathy 05/15/2016  . Memory change 05/15/2016  . Osteoarthritis of spine with radiculopathy, lumbar region 05/15/2016  . Risk for falls 05/15/2016  . Acute medial meniscal tear, left, subsequent encounter 04/23/2016  . Recurrent infections 03/29/2016  . Chronic nonseasonal allergic rhinitis due to pollen 03/29/2016  . Glucose intolerance (impaired glucose tolerance) 01/16/2016  . Polypharmacy 01/16/2016  . BMI 47.46,   adult ([place]) 03/06/2015  . SDAT 02/05/2015  .  Asthma 02/05/2015  . OSA and COPD overlap syndrome ([place]) 02/05/2015  . Medication management 08/02/2014  . GERD (gastroesophageal reflux disease) 05/09/2014  . Obesity, Class III, BMI 40-49.9 (morbid obesity) ([place]) 02/01/2014  . Vitamin D deficiency 08/01/2013  . Prediabetes 08/01/2013  . 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 Diverticula of colon 05/07/2011  . Hypertension 01/31/2011  . Hyperlipidemia, mixed 01/31/2011  . BPH (benign prostatic hyperplasia) 01/31/2011  . Testosterone Deficiency 01/31/2011  . IBS (irritable bowel syndrome) 01/31/2011  . Partial complex seizure disorder with intractable epilepsy ([place]) 01/31/2011  . Depression, major, recurrent, in partial remission ([place]) 01/31/2011  . Asthma-COPD overlap syndrome ([place]) 01/31/2011   Past Medical History:  Diagnosis Date  . Anesthesia complication requiring reversal agent administration    ? from central apnea, very difficult to get off vent  . Anxiety   . Arthritis    osteo  . Asthma   . BPH (benign prostatic hyperplasia)   . Complication of anesthesia    difficulty waking , they twlight me because of my respiratory problems "  . Dyspnea   . Enlarged heart   . Family history of adverse reaction to anesthesia    mother trouble waking up, and heart stopped  . GERD (gastroesophageal reflux disease)   . Headache    botox injections for headaches  . Hyperlipidemia   . Hypertension   . Hypogonadism male   . IBS (irritable bowel syndrome)   . Obesity   . OSA (obstructive sleep apnea)    cpap  . Pneumonia   . Pre-diabetes   . Prostatitis     Family History  Problem Relation Age of Onset  . Diabetes Paternal Uncle   . Cancer Father        lymphoma, colon  . Diabetes Maternal Grandmother   . Heart disease Maternal Grandfather   . Diabetes Maternal Grandfather   . Diabetes Paternal Grandmother   . Diabetes Paternal Grandfather   . Dementia Mother   . Prostate cancer Maternal Uncle   . Lung disease Neg Hx   .  Rheumatologic disease Neg Hx     Past Surgical History:  Procedure Laterality Date  . ABDOMINAL SURGERY    . ANKLE FRACTURE SURGERY Right   . CYSTOSCOPY     [place]  . KNEE ARTHROSCOPY WITH MEDIAL MENISECTOMY Left 01/02/2017   Procedure: LEFT KNEE ARTHROSCOPY WITH PARTIAL MEDIAL MENISCECTOMY;  Surgeon: Mcarthur Rossetti, MD;  Location: [facility];  Service: Orthopedics;  Laterality: Left;  . TONSILLECTOMY    . [place] RESECTION  2007  . UVULOPALATOPHARYNGOPLASTY     Social History   Occupational History  . Occupation: Dance movement psychotherapist  Tobacco Use  . Smoking status: Never Smoker  . Smokeless tobacco: Never Used  . Tobacco comment: significant second-hand exposure through mother  Substance and Sexual Activity  . Alcohol use: Yes    Alcohol/week: 0.6  oz    Types: 1 Glasses of wine per week    Comment: 2 x a year  . Drug use: No  . Sexual activity: No

## 2017-05-12 ENCOUNTER — Ambulatory Visit: Payer: BLUE CROSS/BLUE SHIELD | Admitting: Podiatry

## 2017-05-12 DIAGNOSIS — N3944 Nocturnal enuresis: Secondary | ICD-10-CM | POA: Diagnosis not present

## 2017-05-12 DIAGNOSIS — M7662 Achilles tendinitis, left leg: Secondary | ICD-10-CM | POA: Diagnosis not present

## 2017-05-12 DIAGNOSIS — M79676 Pain in unspecified toe(s): Secondary | ICD-10-CM

## 2017-05-12 DIAGNOSIS — M7661 Achilles tendinitis, right leg: Secondary | ICD-10-CM | POA: Diagnosis not present

## 2017-05-12 DIAGNOSIS — B351 Tinea unguium: Secondary | ICD-10-CM

## 2017-05-12 DIAGNOSIS — M722 Plantar fascial fibromatosis: Secondary | ICD-10-CM

## 2017-05-12 NOTE — Progress Notes (Signed)
He presents today chief complaint of painful elongated toenails and painful plantar fasciitis and Achilles tendinitis.  Objective: Vital signs are stable he is alert and oriented x3.  Pulses are palpable.  Toenails are long thick yellow dystrophic with mycotic painful palpation as well as debridement.  Pain to palpation medial calcaneal tubercles bilateral and distal most aspect of the Achilles tendons at its insertion site bilaterally.  Assessment: Plantar fasciitis Achilles tendinitis and pain in limb secondary to onychomycosis.  Plan: Debridement of toenails 1 through 5 bilateral comes are secondary to pain also consider shockwave therapy.

## 2017-05-13 ENCOUNTER — Ambulatory Visit: Payer: BLUE CROSS/BLUE SHIELD | Admitting: Pulmonary Disease

## 2017-05-15 ENCOUNTER — Encounter: Payer: Self-pay | Admitting: Internal Medicine

## 2017-05-20 DIAGNOSIS — G4733 Obstructive sleep apnea (adult) (pediatric): Secondary | ICD-10-CM | POA: Diagnosis not present

## 2017-05-22 ENCOUNTER — Ambulatory Visit (INDEPENDENT_AMBULATORY_CARE_PROVIDER_SITE_OTHER): Payer: Self-pay

## 2017-05-22 DIAGNOSIS — M722 Plantar fascial fibromatosis: Secondary | ICD-10-CM

## 2017-05-26 ENCOUNTER — Other Ambulatory Visit: Payer: Self-pay | Admitting: Physician Assistant

## 2017-05-26 DIAGNOSIS — L309 Dermatitis, unspecified: Secondary | ICD-10-CM

## 2017-05-27 LAB — MYCOBACTERIA,CULT W/FLUOROCHROME SMEAR
MICRO NUMBER:: 81295765
SMEAR: NONE SEEN
SPECIMEN QUALITY:: ADEQUATE

## 2017-05-28 ENCOUNTER — Ambulatory Visit: Payer: BLUE CROSS/BLUE SHIELD | Admitting: Adult Health

## 2017-05-28 ENCOUNTER — Encounter: Payer: Self-pay | Admitting: Adult Health

## 2017-05-28 VITALS — BP 142/90 | HR 86 | Temp 97.3°F | Ht 72.0 in | Wt 389.0 lb

## 2017-05-28 DIAGNOSIS — J00 Acute nasopharyngitis [common cold]: Secondary | ICD-10-CM | POA: Diagnosis not present

## 2017-05-28 DIAGNOSIS — R0989 Other specified symptoms and signs involving the circulatory and respiratory systems: Secondary | ICD-10-CM

## 2017-05-28 MED ORDER — PREDNISONE 20 MG PO TABS: ORAL_TABLET | ORAL | 0 refills | Status: DC

## 2017-05-28 MED ORDER — AZITHROMYCIN 250 MG PO TABS: ORAL_TABLET | ORAL | 1 refills | Status: AC

## 2017-05-28 NOTE — Progress Notes (Signed)
Assessment and Plan:  Mark Harrell was seen today for uri.  Diagnoses and all orders for this visit:  Rhinopharyngitis Suggested symptomatic OTC remedies. Continue allergy medication, advised rotating agent q6 months Nasal saline spray for congestion. Nasal steroids, allergy pill, oral steroids Follow up as needed. -     azithromycin (ZITHROMAX) 250 MG tablet; Take 2 tablets (500 mg) on  Day 1,  followed by 1 tablet (250 mg) once daily on Days 2 through 5.  Chest congestion Continue guaifenesin, respiratory medications as prescribed -     predniSONE (DELTASONE) 20 MG tablet; 2 tablets daily for 3 days, 1 tablet daily for 4 days.       -   Go to the ER if any chest pain, shortness of breath, nausea, dizziness, severe HA, changes vision/speech  Further disposition pending results of labs. Discussed med's effects and SE's.   Over 15 minutes of exam, counseling, chart review, and critical decision making was performed.   Future Appointments  Date Time Provider [place]  05/29/2017  9:30 AM [facility] NURSE [name]  06/05/2017  3:00 PM [name]  07/16/2017  3:15 PM [name], MD [name]  07/20/2017  2:30 PM Liane Comber, NP [name]  08/13/2017  2:45 PM Hyatt, Max T, DPM [name]  11/05/2017  3:00 PM [name], MD [name]    ------------------------------------------------------------------------------------------------------------------   HPI BP (!) 142/90   Pulse 86   Temp (!) 97.3 F (36.3 C)   Ht 6' (1.829 m)   Wt (!) 389 lb (176.4 kg)   SpO2 94%   BMI 52.76 kg/m   56 y.o.male with asthma-COPD overlap syndrome, seasonal/perennial allergic rhinitis presents for 3 weeks of feeling "run down" -productive cough/coughing spells, chest congestion, wheezing (after coughing fits), new diarrhea x2 days- improving, denies mucoid/bloody discharges, N/V/abdominal pain.  He denies HA/sinus pain,  dizziness, fever/chills, CP, body aches, rash.   He has taken OTC cold/flu medications, he has taken tussionex, he also has prescribed tramadol for severe cough. He is using his multiple inhalers as prescribed. He is seeing an allergist for weekly shots. He is up to date on immunizations (flu/pneumonia).   Past Medical History:  Diagnosis Date  . Anesthesia complication requiring reversal agent administration    ? from central apnea, very difficult to get off vent  . Anxiety   . Arthritis    osteo  . Asthma   . BPH (benign prostatic hyperplasia)   . Complication of anesthesia    difficulty waking , they twlight me because of my respiratory problems "  . Dyspnea   . Enlarged heart   . Family history of adverse reaction to anesthesia    mother trouble waking up, and heart stopped  . GERD (gastroesophageal reflux disease)   . Headache    botox injections for headaches  . Hyperlipidemia   . Hypertension   . Hypogonadism male   . IBS (irritable bowel syndrome)   . Obesity   . OSA (obstructive sleep apnea)    cpap  . Pneumonia   . Pre-diabetes   . Prostatitis      Allergies  Allergen Reactions  . Bee Venom Swelling  . Ppd [Tuberculin Purified Protein Derivative] Other (See Comments)    +ppd NEG Quantferron Gold 3/13  . Fenofibrate Other (See Comments)    Back pain  . Levofloxacin Diarrhea  . Other Other (See Comments)    Some antibiotics cause diarrhea  . Verapamil Other (See  Comments)    Back pain  . Claritin [Loratadine] Other (See Comments)    unknown    Current Outpatient Medications on File Prior to Visit  Medication Sig  . albuterol (PROVENTIL HFA;VENTOLIN HFA) 108 (90 Base) MCG/ACT inhaler 1 to 2 inhalations 10 to 15 minutes apart every 4 hrs to rescue Asthma  . albuterol (PROVENTIL) (2.5 MG/3ML) 0.083% nebulizer solution Inhale 2.5 mg into the lungs every 4 (four) hours as needed.  [name] alfuzosin (UROXATRAL) 10 MG 24 hr tablet Take 10 mg by mouth daily.    .  baclofen (LIORESAL) 10 MG tablet Take 10 mg by mouth 3 (three) times daily as needed for muscle spasms.   . bisoprolol-hydrochlorothiazide (ZIAC) 5-6.25 MG tablet TAKE 1 TABLET BY MOUTH DAILY.  . budesonide-formoterol (SYMBICORT) 160-4.5 MCG/ACT inhaler Inhale 2 puffs into the lungs 2 (two) times daily.  . Calcium Carbonate-Vitamin D (CALCIUM + D PO) Take 500 mg by mouth 2 (two) times daily.   . cetaphil (CETAPHIL) lotion Apply topically 2 (two) times daily.  . cetirizine (ZYRTEC) 10 MG tablet Take 10 mg by mouth daily.  . chlorpheniramine-HYDROcodone (TUSSIONEX) 10-8 MG/5ML SUER Take 5 mLs every 12 (twelve) hours by mouth.  . Cholecalciferol (VITAMIN D PO) Take 10,000-20,000 Units by mouth 2 (two) times daily. 20000 in the morning and 10000 in the evening  . clotrimazole-betamethasone (LOTRISONE) cream APPLY TO AFFECTED AREA TWICE A DAY  . co-enzyme Q-10 30 MG capsule Take 30 mg by mouth daily.  . cyclobenzaprine (FLEXERIL) 10 MG tablet Take 10 mg by mouth 3 (three) times daily.   [name] desmopressin (DDAVP) 0.2 MG tablet Take 200 mcg by mouth daily.   . diclofenac (VOLTAREN) 75 MG EC tablet Take 1 tablet (75 mg total) by mouth 2 (two) times daily.  . diclofenac sodium (VOLTAREN) 1 % GEL APPLY 4 GRAMS TOPICALLY 4 TIMES DAILY  . donepezil (ARICEPT) 23 MG TABS tablet Take 23 mg by mouth at bedtime.  . DYMISTA 137-50 MCG/ACT SUSP PLACE 2 SPRAYS INTO BOTH NOSTRILS 2 (TWO) TIMES DAILY.  [name] Eluxadoline (VIBERZI) 100 MG TABS Take 1 tablet by mouth 2 (two) times daily.   [name] esomeprazole (NEXIUM) 40 MG capsule Take 1 capsule (40 mg total) by mouth 2 (two) times daily before a meal.  . gabapentin (NEURONTIN) 600 MG tablet Take 1,800 mg by mouth 3 (three) times daily.   [name] guaiFENesin (MUCINEX) 600 MG 12 hr tablet Take 600 mg by mouth 2 (two) times daily as needed for cough or to loosen phlegm.   [name] ipratropium (ATROVENT) 0.06 % nasal spray Place 2 sprays into both nostrils 3 (three) times daily.  . LUTEIN PO Take  1 tablet by mouth 2 (two) times daily.  . memantine (NAMENDA) 10 MG tablet Take 10 mg by mouth 2 (two) times daily.   . Methylfol-Algae-B12-Acetylcyst (CEREFOLIN NAC) 6-90.314-2-600 MG TABS Take 1 tablet by mouth daily.   . montelukast (SINGULAIR) 10 MG tablet TAKE 1 TABLET (10 MG TOTAL) BY MOUTH DAILY.  . Multiple Vitamins-Minerals (MULTIVITAMIN WITH MINERALS) tablet Take 1 tablet by mouth daily.  [name] NUCALA 100 MG SOLR INJECT 100MG  SUBCUTANEOUSLY EVERY 4 WEEKS.  . ondansetron (ZOFRAN-ODT) 4 MG disintegrating tablet TAKE 1 TABLET (4 MG TOTAL) BY MOUTH EVERY 6 (SIX) HOURS AS NEEDED FOR NAUSEA OR VOMITING.  [name] oxyCODONE-acetaminophen (PERCOCET) 10-325 MG tablet Take 1 tablet by mouth every 4 (four) hours as needed for pain.  [name] oxymetazoline (AFRIN) 0.05 % nasal spray Place 1 spray into  both nostrils See admin instructions. 1 SPRAY PER SIDE EACH NIGHT BEFORE DYMISTA  . pentosan polysulfate (ELMIRON) 100 MG capsule Take 100 mg by mouth 3 (three) times daily before meals.   . Phosphatidylserine-DHA-EPA (VAYACOG PO) Take 1 capsule by mouth 2 (two) times daily.  . Potassium 99 MG TABS Take 1 tablet by mouth daily.  . pravastatin (PRAVACHOL) 40 MG tablet TAKE 1 TABLET BY MOUTH AT BEDTIME  . PRESCRIPTION MEDICATION Pt receives weekly allergy shots  . ranitidine (ZANTAC) 150 MG tablet Take 1 tablet (150 mg total) 2 (two) times daily by mouth.  . sertraline (ZOLOFT) 50 MG tablet Take 50 mg by mouth 2 (two) times daily.   . sodium chloride (OCEAN) 0.65 % SOLN nasal spray Place 1 spray into both nostrils 4 (four) times daily as needed for congestion. Uses each time before other nasal sprays  . tadalafil (CIALIS) 5 MG tablet Take 5 mg by mouth every evening.   . Tiotropium Bromide Monohydrate (SPIRIVA RESPIMAT) 2.5 MCG/ACT AERS Use 1 inhalation once dialy  . traMADol (ULTRAM) 50 MG tablet TAKE 1 TABLET EVERY 6 HOURS AS NEEDED FOR COUGH  . triamcinolone cream (KENALOG) 0.1 % APPLY TOPICALLY 4 TIMES A DAY (Patient  taking differently: APPLY TOPICALLY 2 TIMES A DAY AS NEEDED for itching)  . trospium (SANCTURA) 20 MG tablet Take 20 mg by mouth 2 (two) times daily.   [name] ZENPEP 25000-79000 units CPEP Take 2 capsules by mouth 3 (three) times daily.  [name] doxycycline (VIBRAMYCIN) 100 MG capsule Take 1 capsule twice daily with food (Patient not taking: Reported on 05/28/2017)  . mesalamine (LIALDA) 1.2 g EC tablet Take 2.4 g by mouth.  . mesalamine (LIALDA) 1.2 g EC tablet   . Probiotic Product (VSL#3 DS) PACK Take 2 each by mouth daily.   Current Facility-Administered Medications on File Prior to Visit  Medication  . Mepolizumab SOLR 100 mg    ROS: Review of Systems  Constitutional: Negative for chills, diaphoresis, fever and malaise/fatigue.  HENT: Positive for congestion and sore throat. Negative for ear discharge, ear pain, hearing loss, sinus pain and tinnitus.   Eyes: Negative for blurred vision, pain, discharge and redness.  Respiratory: Positive for cough and sputum production. Negative for hemoptysis, shortness of breath, wheezing and stridor.   Cardiovascular: Negative for chest pain, palpitations and orthopnea.  Gastrointestinal: Positive for heartburn. Negative for abdominal pain, diarrhea, nausea and vomiting.  Genitourinary: Negative.   Musculoskeletal: Negative for joint pain and myalgias.  Skin: Negative for rash.  Neurological: Negative for dizziness, sensory change, weakness and headaches.  Endo/Heme/Allergies: Negative for environmental allergies.  Psychiatric/Behavioral: Negative.   All other systems reviewed and are negative.    Physical Exam:  BP (!) 142/90   Pulse 86   Temp (!) 97.3 F (36.3 C)   Ht 6' (1.829 m)   Wt (!) 389 lb (176.4 kg)   SpO2 94%   BMI 52.76 kg/m   General Appearance: Well nourished, morbidly obese, in no apparent distress. Eyes: PERRLA, EOMs, conjunctiva no swelling or erythema Sinuses: No Frontal/maxillary tenderness ENT/Mouth: Ext aud canals clear,  TMs without erythema, bulging. No erythema, swelling, or exudate on post pharynx.  Tonsils not swollen or erythematous. Tongue has pale appearance. Hearing normal.  Neck: Supple, thyroid normal.  Respiratory: Respiratory effort normal, BS equal bilaterally without rales, rhonchi, wheezing or stridor.  Cardio: Heart sounds distant/exam limited by body habitus. Brisk peripheral pulses without notable edema.  Abdomen: Soft, + BS. Extremely obese. Non  tender, no guarding, rebound, no palpable hernias, masses. Lymphatics: Non tender without lymphadenopathy in neck  Musculoskeletal: normal gait.  Skin: Warm, clammy without rashes, lesions, ecchymosis.  Psych: Awake and oriented X 3, normal affect, Insight and Judgment appropriate.     Izora Ribas, NP 5:11 PM [facility]

## 2017-05-28 NOTE — Patient Instructions (Signed)
   HOW TO TREAT VIRAL COUGH AND COLD SYMPTOMS:  -Symptoms usually last at least 1 week with the worst symptoms being around day 4.  - colds usually start with a sore throat and end with a cough, and the cough can take 2 weeks to get better.  -No antibiotics are needed for colds, flu, sore throats, cough, bronchitis UNLESS symptoms are longer than 7 days OR if you are getting better then get drastically worse.  -There are a lot of combination medications (Dayquil, Nyquil, Vicks 44, tyelnol cold and sinus, ETC). Please look at the ingredients on the back so that you are treating the correct symptoms and not doubling up on medications/ingredients.    Medicines you can use  Nasal congestion  - pseudoephedrine (Sudafed)- behind the counter, do not use if you have high blood pressure, medicine that have -D in them.  - phenylephrine (Sudafed PE) -Dextormethorphan + chlorpheniramine (Coridcidin HBP)- okay if you have high blood pressure -Oxymetazoline (Afrin) nasal spray- LIMIT to 3 days -Saline nasal spray -Neti pot (used distilled or bottled water)  Ear pain/congestion  -pseudoephedrine (sudafed) - Nasonex/flonase nasal spray  Fever  -Acetaminophen (Tyelnol) -Ibuprofen (Advil, motrin, aleve)  Sore Throat  -Acetaminophen (Tyelnol) -Ibuprofen (Advil, motrin, aleve) -Drink a lot of water -Gargle with salt water - Rest your voice (don't talk) -Throat sprays -Cough drops  Body Aches  -Acetaminophen (Tyelnol) -Ibuprofen (Advil, motrin, aleve)  Headache  -Acetaminophen (Tyelnol) -Ibuprofen (Advil, motrin, aleve) - Exedrin, Exedrin Migraine  Allergy symptoms (cough, sneeze, runny nose, itchy eyes) -Claritin or loratadine cheapest but likely the weakest  -Zyrtec or certizine at night because it can make you sleepy -The strongest is allegra or fexafinadine  Cheapest at [employer], [employer], [employer]  Cough  -Dextromethorphan (Delsym)- medicine that has DM in it -Guafenesin  (Mucinex/Robitussin) - cough drops - drink lots of water  Chest Congestion  -Guafenesin (Mucinex/Robitussin)  Red Itchy Eyes  - Naphcon-A  Upset Stomach  - Bland diet (nothing spicy, greasy, fried, and high acid foods like tomatoes, oranges, berries) -OKAY- cereal, bread, soup, crackers, rice -Eat smaller more frequent meals -reduce caffeine, no alcohol -Loperamide (Imodium-AD) if diarrhea -Prevacid for heart burn  General health when sick  -Hydration -wash your hands frequently -keep surfaces clean -change pillow cases and sheets often -Get fresh air but do not exercise strenuously -Vitamin D, double up on it - Vitamin C -Zinc       

## 2017-05-29 ENCOUNTER — Ambulatory Visit (INDEPENDENT_AMBULATORY_CARE_PROVIDER_SITE_OTHER): Payer: BLUE CROSS/BLUE SHIELD | Admitting: Podiatry

## 2017-05-29 DIAGNOSIS — M722 Plantar fascial fibromatosis: Secondary | ICD-10-CM

## 2017-05-29 DIAGNOSIS — M7662 Achilles tendinitis, left leg: Secondary | ICD-10-CM

## 2017-05-29 DIAGNOSIS — M79676 Pain in unspecified toe(s): Secondary | ICD-10-CM

## 2017-05-29 DIAGNOSIS — M7661 Achilles tendinitis, right leg: Secondary | ICD-10-CM

## 2017-06-05 ENCOUNTER — Encounter: Payer: Self-pay | Admitting: Allergy & Immunology

## 2017-06-05 ENCOUNTER — Ambulatory Visit: Payer: BLUE CROSS/BLUE SHIELD

## 2017-06-06 ENCOUNTER — Other Ambulatory Visit: Payer: Self-pay | Admitting: Physician Assistant

## 2017-06-06 DIAGNOSIS — K219 Gastro-esophageal reflux disease without esophagitis: Secondary | ICD-10-CM

## 2017-06-08 NOTE — Progress Notes (Signed)
Patient presents with chronic pain in both heels both medial and posterior pain is present. He has tried various treatments with no success  Pain and tenderness on palpation of both medial heel bands and posterior heels when pressed  ESWT administered to all areas and tolerated well at 4 joules to all areas. I told him to avoid use of NSAIDs and ice and to wear supportive shoes. He is to follow up in 1 week for 2nd treatment

## 2017-06-18 ENCOUNTER — Other Ambulatory Visit: Payer: Self-pay | Admitting: *Deleted

## 2017-06-18 MED ORDER — BISOPROLOL-HYDROCHLOROTHIAZIDE 5-6.25 MG PO TABS: 1.0000 | ORAL_TABLET | Freq: Every day | ORAL | 1 refills | Status: DC

## 2017-06-20 DIAGNOSIS — G4733 Obstructive sleep apnea (adult) (pediatric): Secondary | ICD-10-CM | POA: Diagnosis not present

## 2017-06-28 ENCOUNTER — Other Ambulatory Visit: Payer: Self-pay | Admitting: Internal Medicine

## 2017-07-01 ENCOUNTER — Telehealth: Payer: Self-pay | Admitting: Adult Health

## 2017-07-01 MED ORDER — RANITIDINE HCL 150 MG PO TABS: 150.0000 mg | ORAL_TABLET | Freq: Two times a day (BID) | ORAL | 0 refills | Status: DC

## 2017-07-01 NOTE — Telephone Encounter (Signed)
Former [facility] patient last seen 11.15.18 for COPD follow up with recs to follow up in 6 weeks: Patient Instructions   Continue using your medications as prescribed.  We will check a culture of your sputum. Remember the specimen needs to be delivered within 4 hours, should not be refrigerated, and early morning specimens are the best.  Keep taking your Nexium and we are adding Zantac at night before bed to your regimen to help control your reflux.   Call us if you have any questions or concerns.    TESTS ORDERED: 1. Sputum Culture Routine, AFB, & Fungus.    Pt never scheduled follow up Will send refill x1 #60 with 0 additional refills and note to pharmacy that pt is overdue for appt

## 2017-07-01 NOTE — Telephone Encounter (Signed)
Where did we end up with this? Have we submitted for immunoglobulin replacement?   [name], MD [facility] [place] [facility] [place]

## 2017-07-03 NOTE — Telephone Encounter (Signed)
I have submitted for IgG replacement but was denied.  I have not submitted appeal yet because I had appeal already going for Dupixent.  I just got same approved and was going to resubmit appeal again for SCIG. I plan to contact patient to advise him of the approval for Dupixent

## 2017-07-04 NOTE — Telephone Encounter (Signed)
That is great news. Thanks, Tammy! Let me know know if you need help with the appeal.   [name], MD All[facility] and [place] [facility]

## 2017-07-09 ENCOUNTER — Telehealth: Payer: Self-pay | Admitting: *Deleted

## 2017-07-09 NOTE — Telephone Encounter (Signed)
L/m for patient to contact me to discuss change of asthma biologic from [place] to [place]. Finally got Dupixent approved and still working on replacement IgG due to denial and appeal.

## 2017-07-13 ENCOUNTER — Encounter: Payer: Self-pay | Admitting: Adult Health

## 2017-07-13 ENCOUNTER — Ambulatory Visit: Payer: BLUE CROSS/BLUE SHIELD | Admitting: Adult Health

## 2017-07-13 VITALS — BP 128/88 | HR 90 | Temp 98.2°F | Ht 72.0 in | Wt 385.0 lb

## 2017-07-13 DIAGNOSIS — J441 Chronic obstructive pulmonary disease with (acute) exacerbation: Secondary | ICD-10-CM | POA: Diagnosis not present

## 2017-07-13 DIAGNOSIS — R05 Cough: Secondary | ICD-10-CM

## 2017-07-13 DIAGNOSIS — R059 Cough, unspecified: Secondary | ICD-10-CM

## 2017-07-13 MED ORDER — PREDNISONE 20 MG PO TABS: ORAL_TABLET | ORAL | 0 refills | Status: AC

## 2017-07-13 MED ORDER — HYDROCODONE-HOMATROPINE 5-1.5 MG/5ML PO SYRP: 5.0000 mL | ORAL_SOLUTION | Freq: Four times a day (QID) | ORAL | 0 refills | Status: DC | PRN

## 2017-07-13 MED ORDER — LEVOFLOXACIN 750 MG PO TABS: 750.0000 mg | ORAL_TABLET | Freq: Every day | ORAL | 0 refills | Status: AC

## 2017-07-13 NOTE — Progress Notes (Signed)
Assessment and Plan:  Mark Harrell was seen today for uri and fatigue.  Diagnoses and all orders for this visit:  COPD exacerbation ([place]) Suggested symptomatic OTC remedies. Nasal saline spray for congestion. Nasal steroids, allergy pill, oral steroids Follow up as needed, present to ER for increasing dyspnea not improved by rescue inhalers -     predniSONE (DELTASONE) 20 MG tablet; 3 tablets daily with food for 3 days, 2 tabs daily for 3 days, 1 tab a day for 5 days. -     levofloxacin (LEVAQUIN) 750 MG tablet; Take 1 tablet (750 mg total) by mouth daily for 5 days.  Cough -     HYDROcodone-homatropine (HYCODAN) 5-1.5 MG/5ML syrup; Take 5 mLs by mouth every 6 (six) hours as needed for cough. Do not take with other opioids such as tramadol.  Further disposition pending results of labs. Discussed med's effects and SE's.   Over 15 minutes of exam, counseling, chart review, and critical decision making was performed.   Future Appointments  Date Time Provider [place]  07/16/2017  3:15 PM [name], MD [name]  07/20/2017  2:30 PM Liane Comber, NP [name]  08/13/2017  2:45 PM Hyatt, Max T, DPM [name]  11/05/2017  3:00 PM [name], MD [name]    ------------------------------------------------------------------------------------------------------------------   HPI BP 128/88   Pulse 90   Temp 98.2 F (36.8 C)   Ht 6' (1.829 m)   Wt (!) 385 lb (174.6 kg)   SpO2 94%   BMI 52.22 kg/m   57 y.o.male morbidly obese with hx of asthma-CPD overlap, OSA, chronic allergic rhinitis, presents for cough (mildly productive of scant thick phlegm), wheezing, fatigue/malaise, increased dyspnea on exertion, lumbar pain. He reports increased orthopnea over the last few days. Symptoms started 5 days ago. Denies edema of lower extremities, chest pain, palpitations, nausea. Denies fever/chills.   96% on RA by recheck by provider. Patient is able  to speak in complete sentences  He reports he has tried tessalon, tramadol for cough, robitussin, coricidin, leftover promethazine- DM cough syrup, feels these have been insufficient for cough and reports poor sleep r/t cough.   Has been using increased albuterol over past few days  He reports he has been caring for his mother who has had similar symptoms.   Past Medical History:  Diagnosis Date  . Anesthesia complication requiring reversal agent administration    ? from central apnea, very difficult to get off vent  . Anxiety   . Arthritis    osteo  . Asthma   . BPH (benign prostatic hyperplasia)   . Complication of anesthesia    difficulty waking , they twlight me because of my respiratory problems "  . Dyspnea   . Enlarged heart   . Family history of adverse reaction to anesthesia    mother trouble waking up, and heart stopped  . GERD (gastroesophageal reflux disease)   . Headache    botox injections for headaches  . Hyperlipidemia   . Hypertension   . Hypogonadism male   . IBS (irritable bowel syndrome)   . Obesity   . OSA (obstructive sleep apnea)    cpap  . Pneumonia   . Pre-diabetes   . Prostatitis      Allergies  Allergen Reactions  . Bee Venom Swelling  . Ppd [Tuberculin Purified Protein Derivative] Other (See Comments)    +ppd NEG Quantferron Gold 3/13  . Fenofibrate Other (See Comments)    Back pain  .  Levofloxacin Diarrhea  . Other Other (See Comments)    Some antibiotics cause diarrhea  . Verapamil Other (See Comments)    Back pain  . Claritin [Loratadine] Other (See Comments)    unknown    Current Outpatient Medications on File Prior to Visit  Medication Sig  . albuterol (PROVENTIL HFA;VENTOLIN HFA) 108 (90 Base) MCG/ACT inhaler 1 to 2 inhalations 10 to 15 minutes apart every 4 hrs to rescue Asthma  . albuterol (PROVENTIL) (2.5 MG/3ML) 0.083% nebulizer solution Inhale 2.5 mg into the lungs every 4 (four) hours as needed.  [name] alfuzosin (UROXATRAL)  10 MG 24 hr tablet Take 10 mg by mouth daily.    . baclofen (LIORESAL) 10 MG tablet Take 10 mg by mouth 3 (three) times daily as needed for muscle spasms.   . bisoprolol-hydrochlorothiazide (ZIAC) 5-6.25 MG tablet Take 1 tablet by mouth daily.  . budesonide-formoterol (SYMBICORT) 160-4.5 MCG/ACT inhaler Inhale 2 puffs into the lungs 2 (two) times daily.  . Calcium Carbonate-Vitamin D (CALCIUM + D PO) Take 500 mg by mouth 2 (two) times daily.   . cetaphil (CETAPHIL) lotion Apply topically 2 (two) times daily.  . cetirizine (ZYRTEC) 10 MG tablet Take 10 mg by mouth daily.  . chlorpheniramine-HYDROcodone (TUSSIONEX) 10-8 MG/5ML SUER Take 5 mLs every 12 (twelve) hours by mouth.  . Cholecalciferol (VITAMIN D PO) Take 10,000-20,000 Units by mouth 2 (two) times daily. 20000 in the morning and 10000 in the evening  . clotrimazole-betamethasone (LOTRISONE) cream APPLY TO AFFECTED AREA TWICE A DAY  . co-enzyme Q-10 30 MG capsule Take 30 mg by mouth daily.  . cyclobenzaprine (FLEXERIL) 10 MG tablet Take 10 mg by mouth 3 (three) times daily.   [name] desmopressin (DDAVP) 0.2 MG tablet Take 200 mcg by mouth daily.   . diclofenac (VOLTAREN) 75 MG EC tablet Take 1 tablet (75 mg total) by mouth 2 (two) times daily.  . diclofenac sodium (VOLTAREN) 1 % GEL APPLY 4 GRAMS TOPICALLY 4 TIMES DAILY  . donepezil (ARICEPT) 23 MG TABS tablet Take 23 mg by mouth at bedtime.  . DYMISTA 137-50 MCG/ACT SUSP PLACE 2 SPRAYS INTO BOTH NOSTRILS 2 (TWO) TIMES DAILY.  [name] Eluxadoline (VIBERZI) 100 MG TABS Take 1 tablet by mouth 2 (two) times daily.   [name] esomeprazole (NEXIUM) 40 MG capsule TAKE 1 CAPSULE (40 MG TOTAL) BY MOUTH 2 (TWO) TIMES DAILY BEFORE A MEAL.  [name] gabapentin (NEURONTIN) 600 MG tablet Take 1,800 mg by mouth 3 (three) times daily.   [name] guaiFENesin (MUCINEX) 600 MG 12 hr tablet Take 600 mg by mouth 2 (two) times daily as needed for cough or to loosen phlegm.   [name] ipratropium (ATROVENT) 0.06 % nasal spray Place 2 sprays into  both nostrils 3 (three) times daily.  . LUTEIN PO Take 1 tablet by mouth 2 (two) times daily.  . memantine (NAMENDA) 10 MG tablet Take 10 mg by mouth 2 (two) times daily.   . mesalamine (LIALDA) 1.2 g EC tablet   . Methylfol-Algae-B12-Acetylcyst (CEREFOLIN NAC) 6-90.314-2-600 MG TABS Take 1 tablet by mouth daily.   . montelukast (SINGULAIR) 10 MG tablet TAKE 1 TABLET (10 MG TOTAL) BY MOUTH DAILY.  . Multiple Vitamins-Minerals (MULTIVITAMIN WITH MINERALS) tablet Take 1 tablet by mouth daily.  [name] NUCALA 100 MG SOLR INJECT 100MG  SUBCUTANEOUSLY EVERY 4 WEEKS.  . ondansetron (ZOFRAN-ODT) 4 MG disintegrating tablet TAKE 1 TABLET (4 MG TOTAL) BY MOUTH EVERY 6 (SIX) HOURS AS NEEDED FOR NAUSEA OR VOMITING.  [name] oxyCODONE-acetaminophen (  PERCOCET) 10-325 MG tablet Take 1 tablet by mouth every 4 (four) hours as needed for pain.  [name] oxymetazoline (AFRIN) 0.05 % nasal spray Place 1 spray into both nostrils See admin instructions. 1 SPRAY PER SIDE EACH NIGHT BEFORE DYMISTA  . pentosan polysulfate (ELMIRON) 100 MG capsule Take 100 mg by mouth 3 (three) times daily before meals.   . Phosphatidylserine-DHA-EPA (VAYACOG PO) Take 1 capsule by mouth 2 (two) times daily.  . Potassium 99 MG TABS Take 1 tablet by mouth daily.  . pravastatin (PRAVACHOL) 40 MG tablet TAKE 1 TABLET BY MOUTH AT BEDTIME  . PRESCRIPTION MEDICATION Pt receives weekly allergy shots  . Probiotic Product (VSL#3 DS) PACK Take 2 each by mouth daily.  . ranitidine (ZANTAC) 150 MG tablet Take 1 tablet (150 mg total) by mouth 2 (two) times daily.  . sertraline (ZOLOFT) 50 MG tablet Take 50 mg by mouth 2 (two) times daily.   . sodium chloride (OCEAN) 0.65 % SOLN nasal spray Place 1 spray into both nostrils 4 (four) times daily as needed for congestion. Uses each time before other nasal sprays  . tadalafil (CIALIS) 5 MG tablet Take 5 mg by mouth every evening.   . Tiotropium Bromide Monohydrate (SPIRIVA RESPIMAT) 2.5 MCG/ACT AERS Use 1 inhalation once  dialy  . traMADol (ULTRAM) 50 MG tablet TAKE 1 TABLET EVERY 6 HOURS AS NEEDED FOR COUGH  . triamcinolone cream (KENALOG) 0.1 % APPLY TOPICALLY 4 TIMES A DAY (Patient taking differently: APPLY TOPICALLY 2 TIMES A DAY AS NEEDED for itching)  . trospium (SANCTURA) 20 MG tablet Take 20 mg by mouth 2 (two) times daily.   [name] ZENPEP 25000-79000 units CPEP Take 2 capsules by mouth 3 (three) times daily.  [name] doxycycline (VIBRAMYCIN) 100 MG capsule Take 1 capsule twice daily with food (Patient not taking: Reported on 07/13/2017)  . predniSONE (DELTASONE) 20 MG tablet 2 tablets daily for 3 days, 1 tablet daily for 4 days. (Patient not taking: Reported on 07/13/2017)   Current Facility-Administered Medications on File Prior to Visit  Medication  . Mepolizumab SOLR 100 mg    ROS: Review of Systems  Constitutional: Positive for malaise/fatigue. Negative for chills, diaphoresis and fever.  HENT: Positive for congestion. Negative for ear discharge, ear pain, hearing loss, sinus pain, sore throat and tinnitus.   Eyes: Negative for blurred vision, pain, discharge and redness.  Respiratory: Positive for cough, sputum production, shortness of breath and wheezing. Negative for hemoptysis and stridor.   Cardiovascular: Positive for orthopnea and PND. Negative for chest pain, palpitations, claudication and leg swelling.  Gastrointestinal: Negative for abdominal pain, diarrhea, nausea and vomiting.  Genitourinary: Negative.   Musculoskeletal: Negative for joint pain and myalgias.  Skin: Negative for rash.  Neurological: Negative for dizziness, sensory change, weakness and headaches.  Endo/Heme/Allergies: Negative for environmental allergies.  Psychiatric/Behavioral: Negative.   All other systems reviewed and are negative.  [name]   Physical Exam:  BP 128/88   Pulse 90   Temp 98.2 F (36.8 C)   Ht 6' (1.829 m)   Wt (!) 385 lb (174.6 kg)   SpO2 94%   BMI 52.22 kg/m   General Appearance: Well nourished, in no  acute distress. Eyes: PERRLA, EOMs, conjunctiva no swelling or erythema Sinuses: No Frontal/maxillary tenderness ENT/Mouth: Ext aud canals clear, TMs without erythema, bulging. No erythema, swelling, or exudate on post pharynx.  Tonsils not swollen or erythematous. Hearing normal.  Neck: Supple.  Respiratory: Respiratory effort normal, rate somewhat increase BS  equal bilaterally with scattered rales and rhonchi throughout, without wheezing or stridor.  Cardio: RRR with no MRGs. Brisk peripheral pulses without edema.  Abdomen: Obese, Soft, + BS.  Non tender, no guarding, rebound.. Lymphatics: Non tender without lymphadenopathy.  Musculoskeletal: Symmetrical strength, normal gait.  Skin: Cool, clammy without rashes, lesions, ecchymosis.  Psych: Awake and oriented X 3, normal affect, Insight and Judgment appropriate.     Mark Ribas, NP 2:02 PM [facility]

## 2017-07-13 NOTE — Patient Instructions (Signed)
HOW TO TREAT VIRAL COUGH AND COLD SYMPTOMS:  -Symptoms usually last at least 1 week with the worst symptoms being around day 4.  - colds usually start with a sore throat and end with a cough, and the cough can take 2 weeks to get better.  -No antibiotics are needed for colds, flu, sore throats, cough, bronchitis UNLESS symptoms are longer than 7 days OR if you are getting better then get drastically worse.  -There are a lot of combination medications (Dayquil, Nyquil, Vicks 44, tyelnol cold and sinus, ETC). Please look at the ingredients on the back so that you are treating the correct symptoms and not doubling up on medications/ingredients.    Medicines you can use  Nasal congestion  Little Remedies saline spray (aerosol/mist)- can try this, it is in the kids section - pseudoephedrine (Sudafed)- behind the counter, do not use if you have high blood pressure, medicine that have -D in them.  - phenylephrine (Sudafed PE) -Dextormethorphan + chlorpheniramine (Coridcidin HBP)- okay if you have high blood pressure -Oxymetazoline (Afrin) nasal spray- LIMIT to 3 days -Saline nasal spray -Neti pot (used distilled or bottled water)  Ear pain/congestion  -pseudoephedrine (sudafed) - Nasonex/flonase nasal spray  Fever  -Acetaminophen (Tyelnol) -Ibuprofen (Advil, motrin, aleve)  Sore Throat  -Acetaminophen (Tyelnol) -Ibuprofen (Advil, motrin, aleve) -Drink a lot of water -Gargle with salt water - Rest your voice (don't talk) -Throat sprays -Cough drops  Body Aches  -Acetaminophen (Tyelnol) -Ibuprofen (Advil, motrin, aleve)  Headache  -Acetaminophen (Tyelnol) -Ibuprofen (Advil, motrin, aleve) - Exedrin, Exedrin Migraine  Allergy symptoms (cough, sneeze, runny nose, itchy eyes) -Claritin or loratadine cheapest but likely the weakest  -Zyrtec or certizine at night because it can make you sleepy -The strongest is allegra or fexafinadine  Cheapest at [employer], [employer],  [employer]  Cough  -Dextromethorphan (Delsym)- medicine that has DM in it -Guafenesin (Mucinex/Robitussin) - cough drops - drink lots of water  Chest Congestion  -Guafenesin (Mucinex/Robitussin)  Red Itchy Eyes  - Naphcon-A  Upset Stomach  - Bland diet (nothing spicy, greasy, fried, and high acid foods like tomatoes, oranges, berries) -OKAY- cereal, bread, soup, crackers, rice -Eat smaller more frequent meals -reduce caffeine, no alcohol -Loperamide (Imodium-AD) if diarrhea -Prevacid for heart burn  General health when sick  -Hydration -wash your hands frequently -keep surfaces clean -change pillow cases and sheets often -Get fresh air but do not exercise strenuously -Vitamin D, double up on it - Vitamin C -Zinc       

## 2017-07-14 NOTE — Telephone Encounter (Signed)
Patient had called back and L/M on 2/22. Called patient back unable to leave message on voicemail will try patient back later

## 2017-07-16 ENCOUNTER — Ambulatory Visit: Payer: BLUE CROSS/BLUE SHIELD | Admitting: Allergy & Immunology

## 2017-07-19 NOTE — Progress Notes (Signed)
FOLLOW UP  Assessment and Plan:   Asthma/COPD overlap syndrome/ + ? Ongoing exacerbation Completed antibiotics, still doing prednisone taper, reports ongoing dyspnea, cough Continue inhalers, CXR today, and follow up with pulmonology  Hypertension Well controlled with current medications prior to recent illness, fair control by manual recheck - will postpone change in medication at this time Monitor blood pressure at home; patient to call if consistently greater than 130/80 Continue DASH diet.   Reminder to go to the ER if any CP, SOB, nausea, dizziness, severe HA, changes vision/speech, left arm numbness and tingling and jaw pain.  Cholesterol Currently at LDL goal; diet for triglycerides discussed Continue low cholesterol diet and exercise.  Check lipid panel.   Prediabetes Continue diet and exercise.  Perform daily foot/skin check, notify office of any concerning changes.  Check A1C  Morbid Obesity with co morbidities Long discussion about weight loss, diet, and exercise Recommended diet heavy in fruits and veggies and low in animal meats, cheeses, and dairy products, appropriate calorie intake Discussed ideal weight for height Resume dietary efforts, start walking daily once recovered from URI Will follow up in 3 months  GERD Well managed on current medications; hasn't tolerated taper off of PPI - likely secondary to weight, weight loss recommended strongly Discussed diet, avoiding triggers and other lifestyle changes  Vitamin D Def At goal at last visit; continue supplementation to maintain goal of 70-100 Defer Vit D level  Polypharmacy Discussed needs to get medications from one pharmacy Review medications and discontinue as appropriate - patient very resistant to this - risks discussed Encouraged to discuss with various specialists and request d/c'ing non-essential medications  Continue diet and meds as discussed. Further disposition pending results of labs.  Discussed med's effects and SE's.   Over 30 minutes of exam, counseling, chart review, and critical decision making was performed.   Future Appointments  Date Time Provider [place]  08/13/2017  2:45 PM [place], [place] [name]  11/05/2017  3:00 PM [name], [place] [name]    ----------------------------------------------------------------------------------------------------------------------  HPI 57 y.o. male  presents for 3 month follow up on hypertension, cholesterol, prediabetes, morbid obesity, asthma/COPD and vitamin D deficiency. He reports ongoing cough, dyspnea, has completed levaquin, continues with prednisone taper. He demonstrates rare junky cough while in office today, in not apparent acute distress, able to speak in complete sentences without notable distress. He is previously established with pulmonology for COPD/asthma overlap syndrome treated by albuterol, symbicort, singulaire, spiriva, but reports his provider left and he has not followed up in a while.   BMI is Body mass index is 54.79 kg/m. (abrupt change in weights likely attributable to recent errors in office scales, recent ECHO from 02/2017 reviewed, LV EF 60-65%) he has not been working on diet and exercise. Prior to being ill recently he has been trying to avoid sugars, carbs, fried foods.  Wt Readings from Last 3 Encounters:  07/20/17 (!) 404 lb (183.3 kg)  07/13/17 (!) 385 lb (174.6 kg)  05/28/17 (!) 389 lb (176.4 kg)   His blood pressure has been controlled at home (typically runs 130s/70s), today their BP is BP: 136/82  He does not workout. He denies chest pain, dizziness - he does have dyspnea with activity.    He is on cholesterol medication and denies myalgias. His cholesterol is not at goal. The cholesterol last visit was:   Lab Results  Component Value Date   CHOL 122 04/14/2017   HDL 26 (L) 04/14/2017  LDLCALC 52 10/02/2016   TRIG 308 (H) 04/14/2017   CHOLHDL 4.7  04/14/2017    He has not been working on diet and exercise for prediabetes, and denies increased appetite, nausea, paresthesia of the feet, polydipsia, polyuria, visual disturbances and vomiting. Last A1C in the office was:  Lab Results  Component Value Date   HGBA1C 6.1 (H) 04/14/2017   Patient is on Vitamin D supplement and at goal at recent check:    Lab Results  Component Value Date   VD25OH 74 04/14/2017        Current Medications:  Current Outpatient Medications on File Prior to Visit  Medication Sig  . albuterol (PROVENTIL HFA;VENTOLIN HFA) 108 (90 Base) MCG/ACT inhaler 1 to 2 inhalations 10 to 15 minutes apart every 4 hrs to rescue Asthma  . albuterol (PROVENTIL) (2.5 MG/3ML) 0.083% nebulizer solution Inhale 2.5 mg into the lungs every 4 (four) hours as needed.  [name] alfuzosin (UROXATRAL) 10 MG 24 hr tablet Take 10 mg by mouth daily.    . baclofen (LIORESAL) 10 MG tablet Take 10 mg by mouth 3 (three) times daily as needed for muscle spasms.   . bisoprolol-hydrochlorothiazide (ZIAC) 5-6.25 MG tablet Take 1 tablet by mouth daily.  . budesonide-formoterol (SYMBICORT) 160-4.5 MCG/ACT inhaler Inhale 2 puffs into the lungs 2 (two) times daily.  . Calcium Carbonate-Vitamin D (CALCIUM + D PO) Take 500 mg by mouth 2 (two) times daily.   . cetaphil (CETAPHIL) lotion Apply topically 2 (two) times daily.  . cetirizine (ZYRTEC) 10 MG tablet Take 10 mg by mouth daily.  . chlorpheniramine-HYDROcodone (TUSSIONEX) 10-8 MG/5ML SUER Take 5 mLs every 12 (twelve) hours by mouth.  . Cholecalciferol (VITAMIN D PO) Take 10,000-20,000 Units by mouth 2 (two) times daily. 20000 in the morning and 10000 in the evening  . clotrimazole-betamethasone (LOTRISONE) cream APPLY TO AFFECTED AREA TWICE A DAY  . co-enzyme Q-10 30 MG capsule Take 30 mg by mouth daily.  . cyclobenzaprine (FLEXERIL) 10 MG tablet Take 10 mg by mouth 3 (three) times daily.   [name] desmopressin (DDAVP) 0.2 MG tablet Take 200 mcg by mouth  daily.   . diclofenac (VOLTAREN) 75 MG EC tablet Take 1 tablet (75 mg total) by mouth 2 (two) times daily.  . diclofenac sodium (VOLTAREN) 1 % GEL APPLY 4 GRAMS TOPICALLY 4 TIMES DAILY  . donepezil (ARICEPT) 23 MG TABS tablet Take 23 mg by mouth at bedtime.  . DYMISTA 137-50 MCG/ACT SUSP PLACE 2 SPRAYS INTO BOTH NOSTRILS 2 (TWO) TIMES DAILY.  [name] Eluxadoline (VIBERZI) 100 MG TABS Take 1 tablet by mouth 2 (two) times daily.   [name] esomeprazole (NEXIUM) 40 MG capsule TAKE 1 CAPSULE (40 MG TOTAL) BY MOUTH 2 (TWO) TIMES DAILY BEFORE A MEAL.  [name] gabapentin (NEURONTIN) 600 MG tablet Take 1,800 mg by mouth 3 (three) times daily.   [name] guaiFENesin (MUCINEX) 600 MG 12 hr tablet Take 600 mg by mouth 2 (two) times daily as needed for cough or to loosen phlegm.   [name] HYDROcodone-homatropine (HYCODAN) 5-1.5 MG/5ML syrup Take 5 mLs by mouth every 6 (six) hours as needed for cough. Do not take with other opioids such as tramadol.  [name] ipratropium (ATROVENT) 0.06 % nasal spray Place 2 sprays into both nostrils 3 (three) times daily.  . LUTEIN PO Take 1 tablet by mouth 2 (two) times daily.  . memantine (NAMENDA) 10 MG tablet Take 10 mg by mouth 2 (two) times daily.   . mesalamine (LIALDA) 1.2 g  EC tablet   . Methylfol-Algae-B12-Acetylcyst (CEREFOLIN NAC) 6-90.314-2-600 MG TABS Take 1 tablet by mouth daily.   . montelukast (SINGULAIR) 10 MG tablet TAKE 1 TABLET (10 MG TOTAL) BY MOUTH DAILY.  . Multiple Vitamins-Minerals (MULTIVITAMIN WITH MINERALS) tablet Take 1 tablet by mouth daily.  [name] NUCALA 100 MG SOLR INJECT 100MG  SUBCUTANEOUSLY EVERY 4 WEEKS.  . ondansetron (ZOFRAN-ODT) 4 MG disintegrating tablet TAKE 1 TABLET (4 MG TOTAL) BY MOUTH EVERY 6 (SIX) HOURS AS NEEDED FOR NAUSEA OR VOMITING.  [name] oxyCODONE-acetaminophen (PERCOCET) 10-325 MG tablet Take 1 tablet by mouth every 4 (four) hours as needed for pain.  [name] oxymetazoline (AFRIN) 0.05 % nasal spray Place 1 spray into both nostrils See admin instructions. 1 SPRAY PER SIDE  EACH NIGHT BEFORE DYMISTA  . pentosan polysulfate (ELMIRON) 100 MG capsule Take 100 mg by mouth 3 (three) times daily before meals.   . Phosphatidylserine-DHA-EPA (VAYACOG PO) Take 1 capsule by mouth 2 (two) times daily.  . Potassium 99 MG TABS Take 1 tablet by mouth daily.  . pravastatin (PRAVACHOL) 40 MG tablet TAKE 1 TABLET BY MOUTH AT BEDTIME  . predniSONE (DELTASONE) 20 MG tablet 3 tablets daily with food for 3 days, 2 tabs daily for 3 days, 1 tab a day for 5 days.  [name] PRESCRIPTION MEDICATION Pt receives weekly allergy shots  . Probiotic Product (VSL#3 DS) PACK Take 2 each by mouth daily.  . ranitidine (ZANTAC) 150 MG tablet Take 1 tablet (150 mg total) by mouth 2 (two) times daily.  . sertraline (ZOLOFT) 50 MG tablet Take 50 mg by mouth 2 (two) times daily.   . sodium chloride (OCEAN) 0.65 % SOLN nasal spray Place 1 spray into both nostrils 4 (four) times daily as needed for congestion. Uses each time before other nasal sprays  . tadalafil (CIALIS) 5 MG tablet Take 5 mg by mouth every evening.   . Tiotropium Bromide Monohydrate (SPIRIVA RESPIMAT) 2.5 MCG/ACT AERS Use 1 inhalation once dialy  . traMADol (ULTRAM) 50 MG tablet TAKE 1 TABLET EVERY 6 HOURS AS NEEDED FOR COUGH  . triamcinolone cream (KENALOG) 0.1 % APPLY TOPICALLY 4 TIMES A DAY (Patient taking differently: APPLY TOPICALLY 2 TIMES A DAY AS NEEDED for itching)  . trospium (SANCTURA) 20 MG tablet Take 20 mg by mouth 2 (two) times daily.   [name] ZENPEP 25000-79000 units CPEP Take 2 capsules by mouth 3 (three) times daily.   Current Facility-Administered Medications on File Prior to Visit  Medication  . Mepolizumab SOLR 100 mg     Allergies:  Allergies  Allergen Reactions  . Bee Venom Swelling  . Ppd [Tuberculin Purified Protein Derivative] Other (See Comments)    +ppd NEG Quantferron Gold 3/13  . Fenofibrate Other (See Comments)    Back pain  . Levofloxacin Diarrhea  . Other Other (See Comments)    Some antibiotics cause  diarrhea  . Verapamil Other (See Comments)    Back pain  . Claritin [Loratadine] Other (See Comments)    unknown     Medical History:  Past Medical History:  Diagnosis Date  . Anesthesia complication requiring reversal agent administration    ? from central apnea, very difficult to get off vent  . Anxiety   . Arthritis    osteo  . Asthma   . BPH (benign prostatic hyperplasia)   . Complication of anesthesia    difficulty waking , they twlight me because of my respiratory problems "  . Dyspnea   . Enlarged heart   .  Family history of adverse reaction to anesthesia    mother trouble waking up, and heart stopped  . GERD (gastroesophageal reflux disease)   . Headache    botox injections for headaches  . Hyperlipidemia   . Hypertension   . Hypogonadism male   . IBS (irritable bowel syndrome)   . Obesity   . OSA (obstructive sleep apnea)    cpap  . Pneumonia   . Pre-diabetes   . Prostatitis    Family history- Reviewed and unchanged Social history- Reviewed and unchanged   Review of Systems:  Review of Systems  Constitutional: Positive for malaise/fatigue. Negative for chills, fever and weight loss.  HENT: Negative for hearing loss and tinnitus.   Eyes: Negative for blurred vision and double vision.  Respiratory: Positive for cough, sputum production and shortness of breath. Negative for wheezing.   Cardiovascular: Negative for chest pain, palpitations, orthopnea, claudication and leg swelling.  Gastrointestinal: Negative for abdominal pain, blood in stool, constipation, diarrhea, heartburn, melena, nausea and vomiting.  Genitourinary: Negative.   Musculoskeletal: Negative for joint pain and myalgias.  Skin: Negative for rash.  Neurological: Negative for dizziness, tingling, sensory change, weakness and headaches.  Endo/Heme/Allergies: Negative for polydipsia.  Psychiatric/Behavioral: Negative.   All other systems reviewed and are negative.   Physical Exam: BP  136/82   Pulse 95   Temp 97.9 F (36.6 C)   Resp 18   Ht 6' (1.829 m)   Wt (!) 404 lb (183.3 kg)   SpO2 95%   BMI 54.79 kg/m  Wt Readings from Last 3 Encounters:  07/20/17 (!) 404 lb (183.3 kg)  07/13/17 (!) 385 lb (174.6 kg)  05/28/17 (!) 389 lb (176.4 kg)   General Appearance: Well nourished, obese, in no acute distress. Eyes: PERRLA, EOMs, conjunctiva no swelling or erythema Sinuses: No Frontal/maxillary tenderness ENT/Mouth: Ext aud canals clear, TMs without erythema, bulging. No erythema, swelling, or exudate on post pharynx.  Tonsils not swollen or erythematous. Hearing normal.  Neck: Supple, thyroid normal.  Respiratory: Respiratory effort normal, BS equal bilaterally with scattered rales to bases, rhonchi over bronchial areas bilaterally without wheezing or stridor.  Cardio: Heart sounds distant secondary to body habitus -  no audible MRGs. Brisk peripheral pulses without notable edema.  Abdomen: Soft, morbidly obese, + BS.  Non tender, no guarding, rebound, hernias, masses. Lymphatics: Non tender without lymphadenopathy.  Musculoskeletal: Full ROM, 5/5 strength, Normal gait with cane Skin: Warm, dry without rashes, lesions, ecchymosis.  Neuro: Cranial nerves intact. No cerebellar symptoms.  Psych: Awake and oriented X 3, normal affect, Insight and Judgment appropriate.    Izora Ribas, NP 3:04 PM [facility]

## 2017-07-20 ENCOUNTER — Ambulatory Visit: Payer: BLUE CROSS/BLUE SHIELD | Admitting: Adult Health

## 2017-07-20 ENCOUNTER — Ambulatory Visit (HOSPITAL_COMMUNITY)
Admission: RE | Admit: 2017-07-20 | Discharge: 2017-07-20 | Disposition: A | Discharge status: Home / Self Care | Payer: BLUE CROSS/BLUE SHIELD | Source: Ambulatory Visit | Attending: Adult Health | Admitting: Adult Health

## 2017-07-20 ENCOUNTER — Encounter: Payer: Self-pay | Admitting: Adult Health

## 2017-07-20 VITALS — BP 136/82 | HR 95 | Temp 97.9°F | Resp 18 | Ht 72.0 in | Wt >= 6400 oz

## 2017-07-20 DIAGNOSIS — R059 Cough, unspecified: Secondary | ICD-10-CM

## 2017-07-20 DIAGNOSIS — J449 Chronic obstructive pulmonary disease, unspecified: Secondary | ICD-10-CM

## 2017-07-20 DIAGNOSIS — R05 Cough: Secondary | ICD-10-CM | POA: Diagnosis not present

## 2017-07-20 DIAGNOSIS — E559 Vitamin D deficiency, unspecified: Secondary | ICD-10-CM | POA: Diagnosis not present

## 2017-07-20 DIAGNOSIS — E782 Mixed hyperlipidemia: Secondary | ICD-10-CM | POA: Diagnosis not present

## 2017-07-20 DIAGNOSIS — K219 Gastro-esophageal reflux disease without esophagitis: Secondary | ICD-10-CM

## 2017-07-20 DIAGNOSIS — R7303 Prediabetes: Secondary | ICD-10-CM | POA: Diagnosis not present

## 2017-07-20 DIAGNOSIS — Z79899 Other long term (current) drug therapy: Secondary | ICD-10-CM

## 2017-07-20 DIAGNOSIS — Z6841 Body Mass Index (BMI) 40.0 and over, adult: Secondary | ICD-10-CM | POA: Diagnosis not present

## 2017-07-20 DIAGNOSIS — J45909 Unspecified asthma, uncomplicated: Secondary | ICD-10-CM | POA: Insufficient documentation

## 2017-07-20 DIAGNOSIS — I10 Essential (primary) hypertension: Secondary | ICD-10-CM

## 2017-07-20 DIAGNOSIS — R0602 Shortness of breath: Secondary | ICD-10-CM | POA: Insufficient documentation

## 2017-07-20 NOTE — Patient Instructions (Signed)
Please schedule a follow up with your pulmonologist - let them know you have an ongoing exacerbation/infection    Basics of Medicine Management What should I do when I am taking medicines?  Read all of the labels and the inserts that come with your medicines. Review the information often.  Talk with your pharmacist if you notice a change in the size, color, or shape of your medicines.  Try to get all of your medicines at one pharmacy. The pharmacist will have all your information and will understand possible drug interactions.  Ask your health care provider any questions that you have about your prescribed medicines and any over-the-counter medicines, vitamins, and herbal or dietary supplements that you take. It is important to make sure that nothing will interact with any of your prescribed medicines. What should I know about my medicines?  Know the potential side effects for each medicine that you take.  Know what each of your medicines looks like. This includes size, color, and shape. ? If you are getting confused and having trouble recognizing your different medicines, ask your health care provider or pharmacist about changing your medicines or helping you to identify them more easily. How can I take my medicines safely?  Take medicines only as directed by your health care provider. ? Do not take more of your medicine than instructed. ? Do not take anyone else's medicines. ? Do not share your medicines with other people. ? Do not stop taking your medicines unless you have talked about that with your health care provider. ? You may need to avoid alcohol or certain foods or liquids with one or more of your medicines. Follow your health care provider's instructions.  Do not split, mash, or chew your medicines unless your health care provider tells you to do so. Tell your health care provider if you have trouble swallowing your medicines.  For every liquid medicine, use the dosing  container that was provided. How should I organized my medicines?  Use a tool, such as a weekly pillbox, a written chart from your health care provider, a notebook, or your own calendar to organize your medicine schedule.  If you have trouble recognizing your different medicines, keep them in their original bottles.  Create reminders for taking your medicines. Use sticky notes, or use alarms on your watch, mobile device, or phone calendar.  Your organization system should help you to remember the following information about each medicine: ? Name of the medicine. ? Dosage. ? Schedule. This includes the day and time when it should be taken. ? Appearance. This includes color, shape, size, and stamp. ? How to take your medicines. You may need to take them with or without certain foods, on an empty stomach, with fluids, or by following some other instruction.  More advanced medicine management systems are also available. These offer weekly or monthly options that are complete with storage, alarms, and visual and audio prompts.  Review your medicine schedule with a family member, friend, or caregiver. Other household members should understand your medicines.  If you have trouble reading the names of your different medicines, ask your pharmacist to provide your medicines in containers with large print.  If you take any medicines on an "as needed" basis, such as medicines for nausea or pain, it is important that you remember what you have taken and when you did so. Write down the following information each time you take an "as needed" medicine: the name, the dosage, and the date  and time that you took it. How should I plan ahead for travel?  Take your pillbox, medicines, and organization system with you when you travel.  Have your medicines refilled before you leave for travel. This will ensure that you do not run out of your medicines while you are away from home.  Always carry an updated list  of your medicines with you. If there is an emergency, a respondent can quickly see what medicines you are taking. How should I store and discard my medicines?  Store medicines in a cool, dry area away from light or as directed by your pharmacist or health care provider. The bathroom is not a good place for medicine storage because of heat and humidity.  Store your medicines away from chemicals, medicines for your pet, and medicines of other household members.  Keep medicines where children cannot reach them. Do not leave them on counters or bedside tables. Store them in high cabinets or on high shelves.  Check expiration dates regularly. Do not take expired medicines. Discard medicines that are older than the expiration date.  Learn about the best way to dispose of each medicine that you take. Find out if your local government recycling program, hospital, or pharmacy has a medicine take-back program for safe disposal. If not, some medicines may be mixed with inedible substances and thrown away in the trash in a sealed bag or empty container. What should I remember?  Tell your health care provider if you experience side effects, you have new symptoms, or you have other concerns. There may be dosing changes or alternative medicines that would be better for you.  Review your medicines regularly with your health care provider. Ask if you need to continue to take each medicine, and discuss how well each one is working. Medicines, diet, medical conditions, weight changes, and other habits can all affect how medicines work.  Refill your medicines early so that you do not risk running out.  In case of an accidental overdose, call your local [place] at [phone] or visit your local emergency department immediately. This is important. What should I know about giving medicines to my child?  Use positive reinforcement to help your child take necessary medicines. Try singing, cuddling,  and rewards.  Use only the syringes, droppers, dosing spoons, or dosing cups from your child's health care provider or pharmacist.  Always wash your hands before giving medicines.  Learn about the medicine policies at your child's school. ? Meet with the school nurse to review your child's medicine schedule in detail. ? Do not send oral medicines to school with your child.  If your child has trouble taking medicine, forgets a dose, or spits it up, talk with his or her health care provider.  Do not give over-the-counter cough and cold medicines to your child who is younger than 68 years old, unless directed by his or her health care provider.  Do not give your child aspirin unless instructed to do so by your child's pediatrician or cardiologist.  Make sure that your child knows how to use an inhaler properly, if needed. This information is not intended to replace advice given to you by your health care provider. Make sure you discuss any questions you have with your health care provider. Document Released: 08/27/2010 Document Revised: 01/23/2016 Document Reviewed: 01/12/2014 Elsevier Interactive Patient Education  2018 [employer].

## 2017-07-21 DIAGNOSIS — G4733 Obstructive sleep apnea (adult) (pediatric): Secondary | ICD-10-CM | POA: Diagnosis not present

## 2017-07-21 LAB — CBC WITH DIFFERENTIAL/PLATELET
BASOS ABS: 100 {cells}/uL (ref 0–200)
Basophils Relative: 0.8 %
EOS ABS: 175 {cells}/uL (ref 15–500)
Eosinophils Relative: 1.4 %
HCT: 47.7 % (ref 38.5–50.0)
Hemoglobin: 16.1 g/dL (ref 13.2–17.1)
Lymphs Abs: 2163 cells/uL (ref 850–3900)
MCH: 29 pg (ref 27.0–33.0)
MCHC: 33.8 g/dL (ref 32.0–36.0)
MCV: 85.8 fL (ref 80.0–100.0)
MONOS PCT: 6.8 %
MPV: 10.3 fL (ref 7.5–12.5)
Neutro Abs: 9213 cells/uL — ABNORMAL HIGH (ref 1500–7800)
Neutrophils Relative %: 73.7 %
PLATELETS: 289 10*3/uL (ref 140–400)
RBC: 5.56 10*6/uL (ref 4.20–5.80)
RDW: 12.8 % (ref 11.0–15.0)
TOTAL LYMPHOCYTE: 17.3 %
WBC mixed population: 850 cells/uL (ref 200–950)
WBC: 12.5 10*3/uL — ABNORMAL HIGH (ref 3.8–10.8)

## 2017-07-21 LAB — BASIC METABOLIC PANEL WITH GFR
BUN: 21 mg/dL (ref 7–25)
CHLORIDE: 100 mmol/L (ref 98–110)
CO2: 33 mmol/L — ABNORMAL HIGH (ref 20–32)
CREATININE: 1.04 mg/dL (ref 0.70–1.33)
Calcium: 8.8 mg/dL (ref 8.6–10.3)
GFR, Est African American: 93 mL/min/{1.73_m2} (ref >=60)
GFR, Est Non African American: 80 mL/min/{1.73_m2} (ref >=60)
GLUCOSE: 181 mg/dL — AB (ref 65–99)
POTASSIUM: 4.9 mmol/L (ref 3.5–5.3)
SODIUM: 141 mmol/L (ref 135–146)

## 2017-07-21 LAB — HEPATIC FUNCTION PANEL
AG RATIO: 1.7 (calc) (ref 1.0–2.5)
ALKALINE PHOSPHATASE (APISO): 76 U/L (ref 40–115)
ALT: 25 U/L (ref 9–46)
AST: 19 U/L (ref 10–35)
Albumin: 3.6 g/dL (ref 3.6–5.1)
BILIRUBIN INDIRECT: 0.8 mg/dL (ref 0.2–1.2)
Bilirubin, Direct: 0.2 mg/dL (ref 0.0–0.2)
Globulin: 2.1 g/dL (calc) (ref 1.9–3.7)
TOTAL PROTEIN: 5.7 g/dL — AB (ref 6.1–8.1)
Total Bilirubin: 1 mg/dL (ref 0.2–1.2)

## 2017-07-21 LAB — LIPID PANEL
CHOL/HDL RATIO: 2.9 (calc) (ref <5.0)
CHOLESTEROL: 137 mg/dL (ref <200)
HDL: 48 mg/dL (ref >40)
LDL Cholesterol (Calc): 64 mg/dL (calc)
Non-HDL Cholesterol (Calc): 89 mg/dL (calc) (ref <130)
Triglycerides: 169 mg/dL — ABNORMAL HIGH (ref <150)

## 2017-07-21 LAB — MAGNESIUM: MAGNESIUM: 2.2 mg/dL (ref 1.5–2.5)

## 2017-07-21 LAB — HEMOGLOBIN A1C
Hgb A1c MFr Bld: 6.6 % of total Hgb — ABNORMAL HIGH (ref <5.7)
MEAN PLASMA GLUCOSE: 143 (calc)
eAG (mmol/L): 7.9 (calc)

## 2017-07-21 LAB — TSH: TSH: 0.62 mIU/L (ref 0.40–4.50)

## 2017-07-28 ENCOUNTER — Other Ambulatory Visit: Payer: Self-pay | Admitting: Adult Health

## 2017-08-05 ENCOUNTER — Telehealth: Payer: Self-pay | Admitting: *Deleted

## 2017-08-05 NOTE — Telephone Encounter (Signed)
error 

## 2017-08-05 NOTE — Telephone Encounter (Signed)
Glad he came out of the woodwork. I look forward to seeing him again. I'll route to the front desk so that they can reach out to him as well.   [name], MD [facility]

## 2017-08-05 NOTE — Telephone Encounter (Signed)
Patient called back and advised he had been sick and mother sick.  Just moved in with mother recently.  I advised him that I was still working on approval for [facility] but I did get him approved for [facility] for his asthma.  Will go ahead and submit for same and mail him info on therapy along with copay card to activate.  I will reach out to him in next week or so when delivery set. He advise he somehow had wrong number to [facility] office and needs appt to see Dr [name].  I gave him number and encouraged him to call to make appt

## 2017-08-06 ENCOUNTER — Encounter: Payer: Self-pay | Admitting: Allergy & Immunology

## 2017-08-06 ENCOUNTER — Ambulatory Visit: Payer: BLUE CROSS/BLUE SHIELD | Admitting: Allergy & Immunology

## 2017-08-06 ENCOUNTER — Telehealth: Payer: Self-pay | Admitting: Allergy & Immunology

## 2017-08-06 VITALS — BP 150/92 | HR 88 | Resp 20

## 2017-08-06 DIAGNOSIS — R05 Cough: Secondary | ICD-10-CM

## 2017-08-06 DIAGNOSIS — M7918 Myalgia, other site: Secondary | ICD-10-CM | POA: Diagnosis not present

## 2017-08-06 DIAGNOSIS — B999 Unspecified infectious disease: Secondary | ICD-10-CM | POA: Diagnosis not present

## 2017-08-06 DIAGNOSIS — J3089 Other allergic rhinitis: Secondary | ICD-10-CM

## 2017-08-06 DIAGNOSIS — J449 Chronic obstructive pulmonary disease, unspecified: Secondary | ICD-10-CM

## 2017-08-06 DIAGNOSIS — J302 Other seasonal allergic rhinitis: Secondary | ICD-10-CM

## 2017-08-06 DIAGNOSIS — J4551 Severe persistent asthma with (acute) exacerbation: Secondary | ICD-10-CM | POA: Diagnosis not present

## 2017-08-06 DIAGNOSIS — R059 Cough, unspecified: Secondary | ICD-10-CM

## 2017-08-06 DIAGNOSIS — M5416 Radiculopathy, lumbar region: Secondary | ICD-10-CM | POA: Diagnosis not present

## 2017-08-06 MED ORDER — FLUTICASONE-UMECLIDIN-VILANT 100-62.5-25 MCG/INH IN AEPB: 1.0000 | INHALATION_SPRAY | Freq: Every day | RESPIRATORY_TRACT | 5 refills | Status: DC

## 2017-08-06 MED ORDER — AMOXICILLIN-POT CLAVULANATE 875-125 MG PO TABS: 1.0000 | ORAL_TABLET | Freq: Two times a day (BID) | ORAL | 0 refills | Status: AC

## 2017-08-06 MED ORDER — METHYLPREDNISOLONE ACETATE 80 MG/ML IJ SUSP
80.0000 mg | Freq: Once | INTRAMUSCULAR | Status: AC
  Administered 2017-08-06: 80 mg via INTRAMUSCULAR

## 2017-08-06 MED ORDER — HYDROCODONE-HOMATROPINE 5-1.5 MG/5ML PO SYRP: 5.0000 mL | ORAL_SOLUTION | Freq: Four times a day (QID) | ORAL | 0 refills | Status: DC | PRN

## 2017-08-06 NOTE — Telephone Encounter (Signed)
Pt came in and said that he got a bill for no show and had received a letter tell him we had cancelled his appointment. Please fix it.

## 2017-08-06 NOTE — Telephone Encounter (Signed)
Left message that we have voided the no show fee - kt

## 2017-08-06 NOTE — Progress Notes (Signed)
FOLLOW UP  Date of Service/Encounter:  08/07/17   Assessment:   Non-seasonal allergic rhinitis(sweet vernal grass, box elder, cat, weeds, ragweed, molds, cockroach, dust mite)- on allergen immunotherapy  Recurrent infections  Asthma-COPD overlap syndrome- changing to Dupixent  Hymenoptera allergy  GERD - on Nexium and ranitidine  Obesity - with resulting nerve impingement and joint pain  Migraines - on botulinum and Emgality injections  Plan/Recommendations:    Patient Instructions  1. 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  - We will likely have to do a peer-to-peer evaluation to get this approved.   2. Asthma-COPD overlap syndrome - Lung function looked slightly better today. - Dupixent is approved and we are hoping to get that started.  - We will give you a steroid injection and start you on a prednisone taper to help get this cough under control.  - We will send in Trelegy to replace your Symbicort and Spiriva. - Please let us know if this is working well for you.  - Daily controller medication(s): Dupixent 300mg  every two weeks and Trelegy 100/62.5/25 one puff once daily - Prior to physical activity: ProAir 2 puffs 10-15 minutes before physical activity. - Rescue medications: ProAir 4 puffs every 4-6 hours as needed or albuterol nebulizer one vial every 4-6 hours as needed - Asthma control goals:  * Full participation in all desired activities (may need albuterol before activity) * Albuterol use two time or less a week on average (not counting use with activity) * Cough interfering with sleep two time or less a month * Oral steroids no more than once a year * No hospitalizations  3. Chronic allergic rhinitis (sweet vernal grass, box elder, cat, weeds, ragweed, molds, cockroach, dust mite) - Continue with Dymista 2 sprays per nostril 1-2 times daily. -  Continue with saline mist 1-2 times daily.  - Continue with nasal ipratropium (Atrovent) one spray per nostril every 6 hours as needed.  - Use cetirizine 10mg  daily as needed for breakthrough symptoms. - Sample of Cristino Martes provided today to use as needed. - Resume allergy shots once you have gotten more stabilized from a respiratory perspective.     4. Chronic sinusitis - s/p a course of azithromycin and levofloxacin - We will start you on another antibiotic to try to get you over this: Augmentin 875mg  twice daily for 10 days - Continue with the nasal saline spray (i.e., Simply Saline) or nasal saline lavage (i.e., NeilMed) as needed prior to medicated nasal sprays. - For thick post nasal drainage, add guaifenesin [id number] mg (Mucinex) twice daily as needed for mucous thinning with adequate hydration to help it work better.  - Mark Harrell has never been evaluated by ENT, but we will wait to see how he responds to this course of antibiotics first.  - We are in the process of submitting for immunoglobulin replacement.   5. Return in about 3 months (around 11/06/2017).  Subjective:   Mark Harrell is a 57 y.o. male presenting today for follow up of  Chief Complaint  Patient presents with  . Asthma    flare up  . Sinus Problem    runny nose, stuffiness, sneezine  . Eczema    doing good    Mark Harrell has a history of the following: Patient Active Problem List   Diagnosis Date Noted  . Seasonal and perennial allergic rhinitis 04/23/2017  . Munchausen  syndrome 04/14/2017  . Cervicalgia 04/01/2017  . Headache 11/13/2016  . Migraine without aura and without status migrainosus, not intractable 10/30/2016  . Irritant contact dermatitis due to frequent handwashing 06/08/2016  . Lumbar radiculopathy 05/15/2016  . Memory change 05/15/2016  . Osteoarthritis of spine with radiculopathy, lumbar region 05/15/2016  . Risk for falls 05/15/2016  . Acute medial meniscal tear, left, subsequent encounter  04/23/2016  . Recurrent infections 03/29/2016  . Chronic nonseasonal allergic rhinitis due to pollen 03/29/2016  . Polypharmacy 01/16/2016  . Morbid obesity with BMI of 50.0-59.9, adult ([place]) 03/06/2015  . SDAT 02/05/2015  . Asthma 02/05/2015  . OSA and COPD overlap syndrome ([facility]) 02/05/2015  . Medication management 08/02/2014  . GERD (gastroesophageal reflux disease) 05/09/2014  . Vitamin D deficiency 08/01/2013  . Prediabetes 08/01/2013  . 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 Diverticula of colon 05/07/2011  . Hypertension 01/31/2011  . Hyperlipidemia, mixed 01/31/2011  . BPH (benign prostatic hyperplasia) 01/31/2011  . Testosterone Deficiency 01/31/2011  . IBS (irritable bowel syndrome) 01/31/2011  . Partial complex seizure disorder with intractable epilepsy ([place]) 01/31/2011  . Depression, major, recurrent, in partial remission ([place]) 01/31/2011  . Asthma-COPD overlap syndrome ([place]) 01/31/2011    History obtained from: chart review and patient.  Mark Harrell's Primary Care Provider is [name], MD.     Mark Harrell is a 57 y.o. male presenting for a follow up visit. He has a very complicated past medical history including recurrent sepsis requiring hospitalizations. He was most recently hospitalized in August 2018 and then again in September 2018. Prior to that, he was hospitalized in January 2018. He has a history of severe persistent asthma as well as allergic rhinitis. We added Nucala in [facility] 2018 which seems to have stabilized his asthma initially, although lately his asthma has been under worse control. We have since stopped the Nucala and are in the process of transitioning him to [place] instead. We have done an immune workup in the past which was largely normal aside from a slightly low IgG which was felt to be secondary to chronic prednisone use. However, we did some additional testing that demonstrate a switched memory B cell defect, so we are resubmitting for immunoglobulin  replacement at this time. He was working on getting involved in a high intensity weight loss program through [facility], but due to worsening health he has been unable to do this.   Since the last visit, he has had two months of illnesses which have just not been able to be resolved. He has been on Levaquin and prednisone and azithromycin. Each time his symptoms seem to improve with regards to sinus pressure and nasal discharge, but the symptoms then return. He does remain on his nasal sprays, but he has not been getting his allergy shots since he has not been feeling well. He is having marked coughing despite the prednisone.   From an asthma perspective, he does not feel that he has been under good control. He is using his albuterol at least once daily. Unfortunately, Dr. Ashok Cordia left the pulmonology practice, and he has not established with another provider at that practice. He does have an appointment set up soon with an NP. He remains on the Symbicort and the Spiriva. He did feel some improvement with the Spiriva initially. Dupixent has been approved but he has not made the necessary follow up phone calls to get this medication delivered.   Haider continues to have a multitude of other chronic medical problems.  He remains on Nexium and ranitidine for his reflux. He did undergo steroid injections in his back and neck due to chronic pain. He has a history of migraines and recently started on Emgality injections; this is a once monthly injection given at home, and has this covered for one year. He did have the botox injections as well, which have helped "enormously".   He was looking into an intensive weight loss program at [facility]. He was trying to get all of his medical problems stabilized before he goes into this weight loss program. He does anticipate needing to do a LapBand at some point. But at this time, he remains fairly dejected with his current clinical state. He has moved in full time with  his mother, who is also being affected by this illness that Mark Harrell is enduring.   Otherwise, there have been no changes to his past medical history, surgical history, family history, or social history.    Review of Systems: a 14-point review of systems is pertinent for what is mentioned in HPI.  Otherwise, all other systems were negative. Constitutional: negative other than that listed in the HPI Eyes: negative other than that listed in the HPI Ears, nose, mouth, throat, and face: negative other than that listed in the HPI Respiratory: negative other than that listed in the HPI Cardiovascular: negative other than that listed in the HPI Gastrointestinal: negative other than that listed in the HPI Genitourinary: negative other than that listed in the HPI Integument: negative other than that listed in the HPI Hematologic: negative other than that listed in the HPI Musculoskeletal: negative other than that listed in the HPI Neurological: negative other than that listed in the HPI Allergy/Immunologic: negative other than that listed in the HPI    Objective:   Blood pressure (!) 150/92, pulse 88, resp. rate 20. There is no height or weight on file to calculate BMI.   Physical Exam:  General: Alert, slightly less interactive, in no acute distress. Obese male.  Eyes: No conjunctival injection bilaterally, no discharge on the right, no discharge on the left, no Horner-Trantas dots present and allergic shiners present bilaterally. PERRL bilaterally. EOMI without pain. No photophobia.  Ears: Right TM pearly gray with normal light reflex, Left TM pearly gray with normal light reflex, Right TM intact without perforation and Left TM intact without perforation.  Nose/Throat: External nose within normal limits, nasal crease present and septum midline. Turbinates edematous with thick discharge. Posterior oropharynx erythematous without cobblestoning in the posterior oropharynx. Tonsils 2+ without  exudates.  Tongue without thrush. Lungs: Decreased breath sounds bilaterally without wheezing, rhonchi or rales. No increased work of breathing. CV: Normal S1/S2. No murmurs. Capillary refill <2 seconds.  Skin: Warm and dry, without lesions or rashes. Neuro:   Grossly intact. No focal deficits appreciated. Responsive to questions.  Diagnostic studies:   Spirometry: results abnormal (FEV1: 2.55/64%, FVC: 3.47/66%, FEV1/FVC: 73%).    Spirometry consistent with possible restrictive disease. Overall values are stable compared to previous evaluations.   Allergy Studies: none    [name], MD [place] [facility] [place]

## 2017-08-06 NOTE — Patient Instructions (Addendum)
1. 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  - We will likely have to do a peer-to-peer evaluation to get this approved.   2. Asthma-COPD overlap syndrome - Lung function looked slightly better today. - Dupixent is approved and we are hoping to get that started.  - We will give you a steroid injection and start you on a prednisone taper to help get this cough under control.  - We will send in Trelegy to replace your Symbicort and Spiriva. - Please let us know if this is working well for you.  - Daily controller medication(s): Dupixent 300mg  every two weeks and Trelegy 100/62.5/25 one puff once daily - Prior to physical activity: ProAir 2 puffs 10-15 minutes before physical activity. - Rescue medications: ProAir 4 puffs every 4-6 hours as needed or albuterol nebulizer one vial every 4-6 hours as needed - Asthma control goals:  * Full participation in all desired activities (may need albuterol before activity) * Albuterol use two time or less a week on average (not counting use with activity) * Cough interfering with sleep two time or less a month * Oral steroids no more than once a year * No hospitalizations  3. Chronic allergic rhinitis (sweet vernal grass, box elder, cat, weeds, ragweed, molds, cockroach, dust mite) - Continue with Dymista 2 sprays per nostril 1-2 times daily. - Continue with saline mist 1-2 times daily.  - Continue with nasal ipratropium (Atrovent) one spray per nostril every 6 hours as needed.  - Use cetirizine 10mg  daily as needed for breakthrough symptoms. - Sample of Cristino Martes provided today to use as needed. - Resume allergy shots once you have gotten more stabilized from a respiratory perspective.     4. Chronic sinusitis - s/p a course of azithromycin and levofloxacin - We will start you on another antibiotic to try to get you over this: Augmentin 875mg  twice daily  for 10 days - Continue with the nasal saline spray (i.e., Simply Saline) or nasal saline lavage (i.e., NeilMed) as needed prior to medicated nasal sprays. - For thick post nasal drainage, add guaifenesin [phone] mg (Mucinex) twice daily as needed for mucous thinning with adequate hydration to help it work better.   5. Return in about 3 months (around 11/06/2017).  Please inform us of any Emergency Department visits, hospitalizations, or changes in symptoms. Call us before going to the ED for breathing or allergy symptoms since we might be able to fit you in for a sick visit. Feel free to contact us anytime with any questions, problems, or concerns.  It was a pleasure to see you again today! Tell Ms. Bonnita Nasuti that I said hello!   Websites that have reliable patient information: 1. American Academy of Asthma, Allergy, and Immunology: www.aaaai.org 2. Food Allergy Research and Education (FARE): foodallergy.org 3. Mothers of Asthmatics: http://www.asthmacommunitynetwork.org 4. American College of Allergy, Asthma, and Immunology: www.acaai.org

## 2017-08-07 ENCOUNTER — Encounter: Payer: Self-pay | Admitting: Allergy & Immunology

## 2017-08-07 DIAGNOSIS — R143 Flatulence: Secondary | ICD-10-CM | POA: Insufficient documentation

## 2017-08-07 DIAGNOSIS — Z8371 Family history of colonic polyps: Secondary | ICD-10-CM | POA: Insufficient documentation

## 2017-08-07 DIAGNOSIS — K5790 Diverticulosis of intestine, part unspecified, without perforation or abscess without bleeding: Secondary | ICD-10-CM | POA: Insufficient documentation

## 2017-08-07 DIAGNOSIS — R197 Diarrhea, unspecified: Secondary | ICD-10-CM | POA: Diagnosis not present

## 2017-08-08 ENCOUNTER — Other Ambulatory Visit: Payer: Self-pay | Admitting: Physician Assistant

## 2017-08-08 DIAGNOSIS — K219 Gastro-esophageal reflux disease without esophagitis: Secondary | ICD-10-CM

## 2017-08-12 ENCOUNTER — Ambulatory Visit: Payer: BLUE CROSS/BLUE SHIELD | Admitting: Acute Care

## 2017-08-12 ENCOUNTER — Ambulatory Visit (INDEPENDENT_AMBULATORY_CARE_PROVIDER_SITE_OTHER)
Admission: RE | Admit: 2017-08-12 | Discharge: 2017-08-12 | Disposition: A | Discharge status: Home / Self Care | Payer: BLUE CROSS/BLUE SHIELD | Source: Ambulatory Visit | Attending: Acute Care | Admitting: Acute Care

## 2017-08-12 ENCOUNTER — Telehealth: Payer: Self-pay | Admitting: Acute Care

## 2017-08-12 ENCOUNTER — Other Ambulatory Visit (INDEPENDENT_AMBULATORY_CARE_PROVIDER_SITE_OTHER): Payer: BLUE CROSS/BLUE SHIELD

## 2017-08-12 ENCOUNTER — Encounter: Payer: Self-pay | Admitting: Acute Care

## 2017-08-12 VITALS — BP 144/98 | HR 114 | Ht 72.0 in | Wt 395.0 lb

## 2017-08-12 DIAGNOSIS — R0602 Shortness of breath: Secondary | ICD-10-CM

## 2017-08-12 DIAGNOSIS — J45909 Unspecified asthma, uncomplicated: Secondary | ICD-10-CM

## 2017-08-12 DIAGNOSIS — J189 Pneumonia, unspecified organism: Secondary | ICD-10-CM | POA: Diagnosis not present

## 2017-08-12 DIAGNOSIS — I10 Essential (primary) hypertension: Secondary | ICD-10-CM | POA: Diagnosis not present

## 2017-08-12 DIAGNOSIS — J449 Chronic obstructive pulmonary disease, unspecified: Secondary | ICD-10-CM

## 2017-08-12 LAB — CBC WITH DIFFERENTIAL/PLATELET
Basophils Absolute: 0 10*3/uL (ref 0.0–0.1)
Basophils Relative: 0.3 % (ref 0.0–3.0)
EOS PCT: 2.9 % (ref 0.0–5.0)
Eosinophils Absolute: 0.4 10*3/uL (ref 0.0–0.7)
HCT: 49.7 % (ref 39.0–52.0)
Hemoglobin: 16.4 g/dL (ref 13.0–17.0)
LYMPHS ABS: 3.2 10*3/uL (ref 0.7–4.0)
Lymphocytes Relative: 24.6 % (ref 12.0–46.0)
MCHC: 33 g/dL (ref 30.0–36.0)
MCV: 87.8 fl (ref 78.0–100.0)
MONO ABS: 1 10*3/uL (ref 0.1–1.0)
MONOS PCT: 7.8 % (ref 3.0–12.0)
NEUTROS ABS: 8.3 10*3/uL — AB (ref 1.4–7.7)
NEUTROS PCT: 64.4 % (ref 43.0–77.0)
PLATELETS: 302 10*3/uL (ref 150.0–400.0)
RBC: 5.66 Mil/uL (ref 4.22–5.81)
RDW: 14.1 % (ref 11.5–15.5)
WBC: 12.9 10*3/uL — ABNORMAL HIGH (ref 4.0–10.5)

## 2017-08-12 LAB — NITRIC OXIDE: NITRIC OXIDE: 10

## 2017-08-12 NOTE — Assessment & Plan Note (Signed)
Worsening dyspnea with slow to resolve flare after illness Treated x 3 rounds antibiotics and 1 round prednisone per PCP No fever at present, secretions are clear CBC with leukocytosis>> similar to last 4 months Plan: FENO- 10 ppb ( Normal) We will check some labs today.( CBC, BNP) We will order full PFT's  CXR today We will call you with results Continue Trelegy 1 puff once daily Rinse mouth after use Continue nebulizer treatments  Continue sinus regimen Continue GERD regimen Continue Mucinex 1200 mg once daily with full glass of water. Low salt diet. Work on weight loss as able. We will refer you to pulmonary rehab ( Dr. [name]) You need to wear your CPAP machine for at least 6 hours every night. Please re-start therapy Follow up with Dr. [name] in 4 weeks after PFT's. Please contact office for sooner follow up if symptoms do not improve or worsen or seek emergency care   Consider Echo to evaluate PAH ( At risk 2/2 to CPAP non-compliance) Last 02/2017 EF 60-65%, G1 DD Consider HRCT

## 2017-08-12 NOTE — Progress Notes (Signed)
History of Present Illness Mark Harrell is a 57 y.o. male with Mixed type COPD, and asthma, OSA on CPAP, GERD, and chronic allergic rhinitis. He was previously followed by Dr. Ashok Cordia. We will reassign to [facility].  HPI Mixed type COPD/ Asthma Hospitalized 01/2017 with right sided lung opacity.. With a course of vancomycin/cefepime tapered to Unasyn and eventually Augmentin. Maintenance Inhaler regimen: Trelegy, Albuterol rescue . He stopped his Nucala in 03/2017, and got sick 04/2017. He is going to start Dupixcent injections per his asthma specialist. GERD Regimen: Nexium and bed wedge for positional therapy Chronic Allergic Rhinitis Regimen: Singulair, Zyrtec. Following with allergy/immunology.Intermittent Afrin, Dymista, and nasal saline rinse. CPAP>> 18 cm H2O with a large-sized [employer] Full Face Mask Simplus mask.     08/12/2017 Annual Follow up: Pt. Presents for 6 month annual follow up. He states he has not  been at his baseline. He stopped Nucala in November 2018. He states he is compliant with his Trelegy and his neb treatments.These  were initiated by his asthma and allergy physician. He is also using his rescue inhaler. He has dyspnea. He states this worsening of his asthma started  at Christmas 2018. He has recently  been treated with his prednisone and  Z pack and  amoxicillin , and an additional antibiotic which he could not recall per his PCP. He is using OTC meds for symptomatic relief. He went back to his allergy doc who put him on Augmentin for a sinus infection  and a prednisone taper last week.[name] He states that nothing has helped, but he does feel he is slowly getting better. He remains short of breath and he is tired. He complains of fatigue. He is not using his CPAP. We discussed how his worseing dyspnea could be related to his non-use of his CPAP.He states he is not using the CPAP because of aspiration while wearing the mask. He states he does not have fever. CXR was done  about 1 month ago, and was without acute process.[name] He states he is having a slow improvement since exposure to something his mother had that has also made her asthma worse in December 2018.Marland KitchenSecretions are clear to yellow. Pt. States he has gained about 6-7 pounds and deconditioning is likely an element . He states his nasal secretions are clear.Pt. States he has had some swelling in his lower extremities. No pain or redness behind the knee, no recent travel. He denies fever, chest pain, orthopnea or hemoptysis..  Test Results: CBC 3/20>>  CBC Latest Ref Rng & Units 08/12/2017 07/20/2017 04/14/2017  WBC 4.0 - 10.5 K/uL 12.9(H) 12.5(H) 12.5(H)  Hemoglobin 13.0 - 17.0 g/dL 16.4 16.1 14.5  Hematocrit 39.0 - 52.0 % 49.7 47.7 45.1  Platelets 150.0 - 400.0 K/uL 302.0 289 276   BNP 3/20>> CXR 08/12/2017>>  CXR 06/2017>> Enlargement of cardiac silhouette. No acute abnormalities.  Spirometry 07/27/2017>> Mild to moderate restriction, No BD response.  Echo 02/2017>> 60-65% EF with grade 1 diastolic dysfunction PFT 16/01/09: FVC 3.47 L (66%) FEV1 2.58 L (64%) FEV1/FVC 0.74 FEF 25-75 1.95 L (57%) negative bronchodilator response TLC 6.21 L (84%) RV 96% ERV 15% DLCO uncorrected 95% 03/28/16: FVC 4.03 L (80%) FEV1 2.80 L (69%) FEV1/FVC 0.69 FEF 25-75 1.62 L (40%) negative bronchodilator response  6MWT 03/09/17:  Walked 2 laps / Baseline Sat 97% on RA / Nadir Sat 97% on RA (walked at a slow pace with a cane - stopped due to dyspnea) 05/08/16:  Walked 321 meters /  Baseline Sat 93% on RA / Nadir Sat 93% on RA @ rest  COMPLIANCE DOWNLOAD 06/03 - 11/24/16:  7% compliance with >/= 4 hours of use. Used 5 days in last 30. Residual AHI 3.2 events/hr. Maximum pressure 12.8 cm H2O.   SPLIT NIGHT SLEEP STUDY (05/07/16): Severe obstructive sleep apnea with AHI 65.7 events/hour. No significant central sleep apnea occurred. Severe desaturation during diagnostic portion to 72% on room air. No cardiac abnormalities or  periodic limb movements were noted. Optimal CPAP pressure was 18 cm H2O with a large-sized [employer] Full Face Mask Simplus mask. Reading physician recommended AutoPap at 10-18 cm H2O pressure.  IMAGING CXR PA/LAT 02/15/17 (personally reviewed by me):  No pleural effusion appreciated. Right upper and lower lobe opacities that are patchy and appear alveolar. No obvious air bronchograms to suggest consolidation. Heart normal in size & mediastinum normal in contour.  ESOPHAGRAM/BARIUM SWALLOW 04/15/16 (per radiologist): Mild nonspecific esophageal dysmotility disorder. Episode of flash laryngeal penetration with swallowing. Small sliding type I hiatal hernia. No visible mass, erosion, or ulcerations.  CXR PA/LAT 02/13/16 (previously reviewed by me): No focal opacity or effusion appreciated. No mass appreciated. Heart borderline normal in size. Mediastinum normal in contour. Low lung volumes.  MICROBIOLOGY Sputum Culture 02/16/17:  Normal Flora   LABS 04/10/16 Alpha-1 antitrypsin: MM (135)  03/28/16 CBC: 11.8/14.2/43.0/309 Eosinophils:  354 IgG: 583 IgA: 154 IgM: 46 IgE: 92  11/02/13 ANA:  Negative DS DNA Ab:  <1 SSA:  <1.0 SSB:  <1.0  02/24/11 ACE:  35 RF:  <10  Echo 02/2017>> EF 16-10%, grade 1 diastolic dysfunction   CBC Latest Ref Rng & Units 08/12/2017 07/20/2017 04/14/2017  WBC 4.0 - 10.5 K/uL 12.9(H) 12.5(H) 12.5(H)  Hemoglobin 13.0 - 17.0 g/dL 16.4 16.1 14.5  Hematocrit 39.0 - 52.0 % 49.7 47.7 45.1  Platelets 150.0 - 400.0 K/uL 302.0 289 276    BMP Latest Ref Rng & Units 07/20/2017 04/14/2017 04/03/2017  Glucose 65 - 99 mg/dL 181(H) 104(H) 203(H)  BUN 7 - 25 mg/dL 21 15 27(H)  Creatinine 0.70 - 1.33 mg/dL 1.04 0.95 1.08  BUN/Creat Ratio 6 - 22 (calc) NOT APPLICABLE NOT APPLICABLE 96(E)  Sodium 135 - 146 mmol/L 141 140 140  Potassium 3.5 - 5.3 mmol/L 4.9 3.9 4.5  Chloride 98 - 110 mmol/L 100 100 102  CO2 20 - 32 mmol/L 33(H) 31 32  Calcium 8.6 - 10.3 mg/dL  8.8 8.4(L) 8.6    BNP    Component Value Date/Time   BNP 173 (H) 01/08/2017 1611    ProBNP No results found for: PROBNP  PFT    Component Value Date/Time   FEV1PRE 2.58 04/21/2016 0806   FEV1POST 2.78 04/21/2016 0806   FVCPRE 3.47 04/21/2016 0806   FVCPOST 3.84 04/21/2016 0806   TLC 6.21 04/21/2016 0806   DLCOUNC 33.45 04/21/2016 0806   PREFEV1FVCRT 74 04/21/2016 0806   PSTFEV1FVCRT 72 04/21/2016 0806    Dg Chest 2 View  Result Date: 07/20/2017 CLINICAL DATA:  Cough, congestion and shortness of breath for 3 weeks, history of asthma, hypertension, GERD EXAM: CHEST  2 VIEW COMPARISON:  04/09/2017 FINDINGS: Enlargement of cardiac silhouette. Mediastinal contours and pulmonary vascularity normal. Lateral margin of the LEFT costophrenic angle excluded. Visualized lungs clear. No acute infiltrate, pleural effusion or pneumothorax. Scattered endplate spur formation thoracic spine. IMPRESSION: Enlargement of cardiac silhouette. No acute abnormalities. Electronically Signed   By: Lavonia Dana M.D.   On: 07/20/2017 15:48     Past medical hx  Past Medical History:  Diagnosis Date  . Anesthesia complication requiring reversal agent administration    ? from central apnea, very difficult to get off vent  . Anxiety   . Arthritis    osteo  . Asthma   . BPH (benign prostatic hyperplasia)   . Complication of anesthesia    difficulty waking , they twlight me because of my respiratory problems "  . Dyspnea   . Enlarged heart   . Family history of adverse reaction to anesthesia    mother trouble waking up, and heart stopped  . GERD (gastroesophageal reflux disease)   . Headache    botox injections for headaches  . Hyperlipidemia   . Hypertension   . Hypogonadism male   . IBS (irritable bowel syndrome)   . Obesity   . OSA (obstructive sleep apnea)    cpap  . Pneumonia   . Pre-diabetes   . Prostatitis      Social History   Tobacco Use  . Smoking status: Former Smoker     Packs/day: 0.10    Years: 15.00    Pack years: 1.50  . Smokeless tobacco: Never Used  . Tobacco comment: significant second-hand exposure through mother  Substance Use Topics  . Alcohol use: Yes    Alcohol/week: 0.6 oz    Types: 1 Glasses of wine per week    Comment: 2 x a year  . Drug use: No    Mr.Roosevelt reports that he has quit smoking. He has a 1.50 pack-year smoking history. he has never used smokeless tobacco. He reports that he drinks about 0.6 oz of alcohol per week. He reports that he does not use drugs.  Tobacco Cessation: Remote smoking history, 2 cigarettes a day x 15 years, 1.5 pack year smoking history.   Past surgical hx, Family hx, Social hx all reviewed.  Current Outpatient Medications on File Prior to Visit  Medication Sig  . albuterol (PROVENTIL HFA;VENTOLIN HFA) 108 (90 Base) MCG/ACT inhaler 1 to 2 inhalations 10 to 15 minutes apart every 4 hrs to rescue Asthma  . albuterol (PROVENTIL) (2.5 MG/3ML) 0.083% nebulizer solution Inhale 2.5 mg into the lungs every 4 (four) hours as needed.  [name] alfuzosin (UROXATRAL) 10 MG 24 hr tablet Take 10 mg by mouth daily.    [name] amoxicillin-clavulanate (AUGMENTIN) 875-125 MG tablet Take 1 tablet by mouth 2 (two) times daily for 10 days.  . baclofen (LIORESAL) 10 MG tablet Take 10 mg by mouth 3 (three) times daily as needed for muscle spasms.   . bisoprolol-hydrochlorothiazide (ZIAC) 5-6.25 MG tablet Take 1 tablet by mouth daily.  . Calcium Carbonate-Vitamin D (CALCIUM + D PO) Take 500 mg by mouth 2 (two) times daily.   . cetaphil (CETAPHIL) lotion Apply topically 2 (two) times daily.  . cetirizine (ZYRTEC) 10 MG tablet Take 10 mg by mouth daily.  . chlorpheniramine-HYDROcodone (TUSSIONEX) 10-8 MG/5ML SUER Take 5 mLs every 12 (twelve) hours by mouth.  . Cholecalciferol (VITAMIN D PO) Take 10,000-20,000 Units by mouth 2 (two) times daily. 20000 in the morning and 10000 in the evening  . clotrimazole-betamethasone (LOTRISONE) cream APPLY  TO AFFECTED AREA TWICE A DAY  . co-enzyme Q-10 30 MG capsule Take 30 mg by mouth daily.  . cyclobenzaprine (FLEXERIL) 10 MG tablet Take 10 mg by mouth 3 (three) times daily.   [name] desmopressin (DDAVP) 0.2 MG tablet Take 200 mcg by mouth daily.   . diclofenac (VOLTAREN) 75 MG EC tablet Take 1 tablet (75  mg total) by mouth 2 (two) times daily.  . diclofenac sodium (VOLTAREN) 1 % GEL APPLY 4 GRAMS TOPICALLY 4 TIMES DAILY  . donepezil (ARICEPT) 23 MG TABS tablet Take 23 mg by mouth at bedtime.  . DYMISTA 137-50 MCG/ACT SUSP PLACE 2 SPRAYS INTO BOTH NOSTRILS 2 (TWO) TIMES DAILY.  [name] Eluxadoline (VIBERZI) 100 MG TABS Take 1 tablet by mouth 2 (two) times daily.   [name] esomeprazole (NEXIUM) 40 MG capsule TAKE 1 CAPSULE (40 MG TOTAL) BY MOUTH 2 (TWO) TIMES DAILY BEFORE A MEAL.  [name] Fluticasone-Umeclidin-Vilant (TRELEGY ELLIPTA) 100-62.5-25 MCG/INH AEPB Inhale 1 puff into the lungs daily.  [name] gabapentin (NEURONTIN) 600 MG tablet Take 1,800 mg by mouth 3 (three) times daily.   [name] guaiFENesin (MUCINEX) 600 MG 12 hr tablet Take 600 mg by mouth 2 (two) times daily as needed for cough or to loosen phlegm.   [name] HYDROcodone-homatropine (HYCODAN) 5-1.5 MG/5ML syrup Take 5 mLs by mouth every 6 (six) hours as needed for cough. Do not take with other opioids such as tramadol.  . LUTEIN PO Take 1 tablet by mouth 2 (two) times daily.  . memantine (NAMENDA) 10 MG tablet Take 10 mg by mouth 2 (two) times daily.   . Methylfol-Algae-B12-Acetylcyst (CEREFOLIN NAC) 6-90.314-2-600 MG TABS Take 1 tablet by mouth daily.   . montelukast (SINGULAIR) 10 MG tablet TAKE 1 TABLET (10 MG TOTAL) BY MOUTH DAILY.  . Multiple Vitamins-Minerals (MULTIVITAMIN WITH MINERALS) tablet Take 1 tablet by mouth daily.  . ondansetron (ZOFRAN-ODT) 4 MG disintegrating tablet TAKE 1 TABLET (4 MG TOTAL) BY MOUTH EVERY 6 (SIX) HOURS AS NEEDED FOR NAUSEA OR VOMITING.  [name] oxyCODONE-acetaminophen (PERCOCET) 10-325 MG tablet Take 1 tablet by mouth every 4 (four) hours  as needed for pain.  [name] oxymetazoline (AFRIN) 0.05 % nasal spray Place 1 spray into both nostrils See admin instructions. 1 SPRAY PER SIDE EACH NIGHT BEFORE DYMISTA  . pentosan polysulfate (ELMIRON) 100 MG capsule Take 100 mg by mouth 3 (three) times daily before meals.   . Phosphatidylserine-DHA-EPA (VAYACOG PO) Take 1 capsule by mouth 2 (two) times daily.  . Potassium 99 MG TABS Take 1 tablet by mouth daily.  . pravastatin (PRAVACHOL) 40 MG tablet TAKE 1 TABLET BY MOUTH AT BEDTIME  . PRESCRIPTION MEDICATION Pt receives weekly allergy shots  . Probiotic Product (VSL#3 DS) PACK Take 2 each by mouth daily.  . ranitidine (ZANTAC) 150 MG tablet TAKE 1 TABLET BY MOUTH TWICE A DAY  . sertraline (ZOLOFT) 50 MG tablet Take 50 mg by mouth 2 (two) times daily.   . sodium chloride (OCEAN) 0.65 % SOLN nasal spray Place 1 spray into both nostrils 4 (four) times daily as needed for congestion. Uses each time before other nasal sprays  . tadalafil (CIALIS) 5 MG tablet Take 5 mg by mouth every evening.   . traMADol (ULTRAM) 50 MG tablet TAKE 1 TABLET EVERY 6 HOURS AS NEEDED FOR COUGH  . triamcinolone cream (KENALOG) 0.1 % APPLY TOPICALLY 4 TIMES A DAY (Patient taking differently: APPLY TOPICALLY 2 TIMES A DAY AS NEEDED for itching)  . trospium (SANCTURA) 20 MG tablet Take 20 mg by mouth 2 (two) times daily.    Current Facility-Administered Medications on File Prior to Visit  Medication  . Mepolizumab SOLR 100 mg     Allergies  Allergen Reactions  . Bee Venom Swelling  . Ppd [Tuberculin Purified Protein Derivative] Other (See Comments)    +ppd NEG Quantferron Gold 3/13  . Fenofibrate  Other (See Comments)    Back pain  . Levofloxacin Diarrhea  . Other Other (See Comments)    Some antibiotics cause diarrhea  . Verapamil Other (See Comments)    Back pain  . Claritin [Loratadine] Other (See Comments)    unknown    Review Of Systems:  Constitutional:   No  weight loss, night sweats,  Fevers, chills,  +fatigue, or  lassitude.  HEENT:   No headaches,  Difficulty swallowing,  Tooth/dental problems, or  Sore throat,                No sneezing, itching, ear ache, nasal congestion, post nasal drip,   CV:  No chest pain,  Orthopnea, PND, +swelling in lower extremities, no anasarca, dizziness, palpitations, syncope.   GI  No heartburn, indigestion, abdominal pain, nausea, vomiting, diarrhea, change in bowel habits, loss of appetite, bloody stools.   Resp: + shortness of breath with exertion less at rest.  No excess mucus, no productive cough,  + non-productive cough,  No coughing up of blood.  + ( white to yellow) change in color of mucus.  + wheezing.  No chest wall deformity  Skin: no rash or lesions.,   GU: no dysuria, change in color of urine, no urgency or frequency.  No flank pain, no hematuria   MS:  No joint pain or swelling.  No decreased range of motion.  No back pain.  Psych:  No change in mood or affect. No depression or anxiety.  No memory loss.   Vital Signs BP (!) 144/98 (BP Location: Left Arm, Cuff Size: Large)   Pulse (!) 114   Ht 6' (1.829 m)   Wt (!) 395 lb (179.2 kg)   SpO2 91%   BMI 53.57 kg/m    Physical Exam:  General- No distress,  A&Ox3 ENT: No sinus tenderness, TM clear, pale nasal mucosa, no oral exudate,no post nasal drip, no LAN Cardiac: S1, S2, regular rate and rhythm, no murmur Chest: No wheeze/ rales/ dullness; no accessory muscle use, no nasal flaring, no sternal retractions Abd.: Soft Non-tender Ext: No clubbing cyanosis, 1+edema,discoloration of lower extremities due to edema. Neuro:Deconditioned at baseline,A&O x 3, MAE x 4, Obese Skin: No rashes, warm and dry, chronic changes of LE edema Psych: normal mood and behavior   Assessment/Plan  Asthma-COPD overlap syndrome (HCC) Worsening dyspnea with slow to resolve flare after illness Treated x 3 rounds antibiotics and 1 round prednisone per PCP No fever at present, secretions are clear CBC  with leukocytosis>> similar to last 4 months Plan: FENO- 10 ppb ( Normal) We will check some labs today.( CBC, BNP) We will order full PFT's  CXR today We will call you with results Continue Trelegy 1 puff once daily Rinse mouth after use Continue nebulizer treatments  Continue sinus regimen Continue GERD regimen Continue Mucinex 1200 mg once daily with full glass of water. Low salt diet. Work on weight loss as able. We will refer you to pulmonary rehab ( Dr. [name]) You need to wear your CPAP machine for at least 6 hours every night. Please re-start therapy Follow up with Dr. [name] in 4 weeks after PFT's. Please contact office for sooner follow up if symptoms do not improve or worsen or seek emergency care   Consider Echo to evaluate PAH ( At risk 2/2 to CPAP non-compliance) Last 02/2017 EF 60-65%, G1 DD Consider HRCT      Magdalen Spatz, NP 08/12/2017  5:53 PM

## 2017-08-12 NOTE — Telephone Encounter (Signed)
Mark Harrell,  Please have the patient come back for a BNP ( Which was what I ordered!!) It was placed as a BMP. Thanks so much.

## 2017-08-12 NOTE — Patient Instructions (Addendum)
It is nice to meet you today. FENO- 10 ppb ( Normal) We will check some labs today.( CBC, BNP) We will order full PFT's  CXR today We will call you with results Continue Trelegy 1 puff once daily Rinse mouth after use Continue nebulizer treatments  Continue sinus regimen Continue GERD regimen Continue Mucinex 1200 mg once daily with full glass of water. Low salt diet. Work on weight loss as able. We will refer you to pulmonary rehab ( Dr. [name]) You need to wear your CPAP machine for at least 6 hours every night. Please re-start therapy Follow up with Dr. [name] in 4 weeks after PFT's. Please contact office for sooner follow up if symptoms do not improve or worsen or seek emergency care   Consider Echo to evaluate PAH ( At risk 2/2 to CPAP non-compliance) Last 02/2017 EF 60-65%, G1 DD Consider HRCT

## 2017-08-13 ENCOUNTER — Other Ambulatory Visit (INDEPENDENT_AMBULATORY_CARE_PROVIDER_SITE_OTHER): Payer: BLUE CROSS/BLUE SHIELD

## 2017-08-13 ENCOUNTER — Other Ambulatory Visit: Payer: Self-pay | Admitting: Acute Care

## 2017-08-13 ENCOUNTER — Ambulatory Visit: Payer: BLUE CROSS/BLUE SHIELD | Admitting: Podiatry

## 2017-08-13 ENCOUNTER — Encounter: Payer: Self-pay | Admitting: Podiatry

## 2017-08-13 DIAGNOSIS — M722 Plantar fascial fibromatosis: Secondary | ICD-10-CM | POA: Diagnosis not present

## 2017-08-13 DIAGNOSIS — B351 Tinea unguium: Secondary | ICD-10-CM | POA: Diagnosis not present

## 2017-08-13 DIAGNOSIS — R0602 Shortness of breath: Secondary | ICD-10-CM

## 2017-08-13 DIAGNOSIS — M79676 Pain in unspecified toe(s): Secondary | ICD-10-CM | POA: Diagnosis not present

## 2017-08-13 DIAGNOSIS — J45909 Unspecified asthma, uncomplicated: Secondary | ICD-10-CM

## 2017-08-13 LAB — BRAIN NATRIURETIC PEPTIDE: Pro B Natriuretic peptide (BNP): 37 pg/mL (ref 0.0–100.0)

## 2017-08-13 NOTE — Telephone Encounter (Signed)
Was able to get ahold of the patient and he is going to come back in to have the BNP drawn. Lab order was placed  Nothing further needed

## 2017-08-13 NOTE — Progress Notes (Signed)
He presents today for follow-up of his plantar fasciitis bilaterally and for elongated toenails.  Objective: Vital signs are stable he is alert and oriented x3 states his plantar fasciitis is really been bothering him.  He has pain on palpation medial continue tubercles bilateral.  Toenails are long thick yellow dystrophic-like mycotic and painful palpation.  Assessment: Pain in limb secondary to onychomycosis and plantar fasciitis.  Plan: Discussed etiology pathology conservative versus surgical therapies.  Debrided toenails in thickness and length 1 through 5 bilateral is cover service secondary to pain.  Also reinjected bilateral heels today with Kenalog 20 mg and 5 mg Marcaine to the point of maximal tenderness medial aspect of the bilateral foot without comp occasions.  He tolerated procedure well and I will follow-up with him in the near future for re-debridement.

## 2017-08-14 LAB — BASIC METABOLIC PANEL
BUN: 22 mg/dL (ref 6–23)
CALCIUM: 9 mg/dL (ref 8.4–10.5)
CO2: 28 meq/L (ref 19–32)
CREATININE: 0.87 mg/dL (ref 0.40–1.50)
Chloride: 101 mEq/L (ref 96–112)
GFR: 96.25 mL/min (ref >60.00)
GLUCOSE: 96 mg/dL (ref 70–99)
Potassium: 4.4 mEq/L (ref 3.5–5.1)
Sodium: 140 mEq/L (ref 135–145)

## 2017-08-15 ENCOUNTER — Other Ambulatory Visit: Payer: Self-pay | Admitting: Internal Medicine

## 2017-08-17 DIAGNOSIS — G4733 Obstructive sleep apnea (adult) (pediatric): Secondary | ICD-10-CM | POA: Diagnosis not present

## 2017-08-17 DIAGNOSIS — I10 Essential (primary) hypertension: Secondary | ICD-10-CM | POA: Diagnosis not present

## 2017-08-17 DIAGNOSIS — Z6841 Body Mass Index (BMI) 40.0 and over, adult: Secondary | ICD-10-CM | POA: Diagnosis not present

## 2017-08-17 DIAGNOSIS — G43709 Chronic migraine without aura, not intractable, without status migrainosus: Secondary | ICD-10-CM | POA: Diagnosis not present

## 2017-08-18 ENCOUNTER — Telehealth (HOSPITAL_COMMUNITY): Payer: Self-pay

## 2017-08-18 DIAGNOSIS — G4733 Obstructive sleep apnea (adult) (pediatric): Secondary | ICD-10-CM | POA: Diagnosis not present

## 2017-08-18 NOTE — Telephone Encounter (Signed)
Patients insurance is active and benefits verified through [employer] - No co-pay, deductible amount of $2,500/$642.05 has been met, out of pocket amount of $7,900/$1,385.75 has been met, 20% co-insurance, and no pre-authorization is required. [place] reference [phone]

## 2017-08-18 NOTE — Telephone Encounter (Signed)
Attempted to call patient in regards to Pulmonary Rehab - lm on vm °

## 2017-08-21 ENCOUNTER — Telehealth: Payer: Self-pay | Admitting: Acute Care

## 2017-08-21 NOTE — Telephone Encounter (Signed)
Patient is aware of results and verbalized understanding.

## 2017-08-24 ENCOUNTER — Telehealth: Payer: Self-pay | Admitting: Pulmonary Disease

## 2017-08-24 DIAGNOSIS — M7918 Myalgia, other site: Secondary | ICD-10-CM | POA: Diagnosis not present

## 2017-08-24 NOTE — Telephone Encounter (Signed)
Attempted to call pt but unable to reach him.  Left a detailed message for him stating the results of the cxr and to continue with the plan that was discussed at last OV.  Nothing further needed at this time.

## 2017-08-31 ENCOUNTER — Other Ambulatory Visit: Payer: Self-pay | Admitting: Allergy & Immunology

## 2017-08-31 ENCOUNTER — Telehealth: Payer: Self-pay | Admitting: Adult Health

## 2017-08-31 MED ORDER — RANITIDINE HCL 150 MG PO TABS: 150.0000 mg | ORAL_TABLET | Freq: Two times a day (BID) | ORAL | 1 refills | Status: DC

## 2017-08-31 NOTE — Telephone Encounter (Signed)
Former [facility] patient Last seen 3.20.19 by SG for 6 month follow up   Received faxed refill request for Ranitidine 150mg  BID from [facility] request for 90 day supply Ranitidine last refilled 3.5.19 for #60 with 2 refills  Refill sent #180 with 1 additional refill  Nothing further needed; will sign off

## 2017-09-04 ENCOUNTER — Encounter: Payer: Self-pay | Admitting: Adult Health

## 2017-09-07 ENCOUNTER — Ambulatory Visit (INDEPENDENT_AMBULATORY_CARE_PROVIDER_SITE_OTHER): Payer: BLUE CROSS/BLUE SHIELD | Admitting: *Deleted

## 2017-09-07 DIAGNOSIS — L209 Atopic dermatitis, unspecified: Secondary | ICD-10-CM | POA: Diagnosis not present

## 2017-09-07 NOTE — Progress Notes (Signed)
Immunotherapy   Patient Details  Name: Mark Harrell MRN: [id number] Date of Birth: 16-Jan-1961  09/07/2017  Mark Harrell started injections for  Dupixent 600mg    Frequency:Every 14 days Epi-Pen:Epi-Pen Available  Consent signed and patient instructions given. Writer gave 1 injection and patient did administer 1 injection to self demonstrated correct technique. Lot [id number] exp: 05/2018  Mark Harrell 09/07/2017, 12:18 PM

## 2017-09-14 DIAGNOSIS — G43709 Chronic migraine without aura, not intractable, without status migrainosus: Secondary | ICD-10-CM | POA: Diagnosis not present

## 2017-09-15 ENCOUNTER — Telehealth (HOSPITAL_COMMUNITY): Payer: Self-pay

## 2017-09-15 NOTE — Telephone Encounter (Signed)
Attempted to call patient to schedule orientation as patient stated he is interested in the 1:30pm exc class - LM on vm

## 2017-09-16 ENCOUNTER — Telehealth (HOSPITAL_COMMUNITY): Payer: Self-pay

## 2017-09-16 NOTE — Telephone Encounter (Signed)
Patient returned phone call and left message late yesterday afternoon when office was closed - Returned patients call in regards to Pulmonary Rehab - Scheduled orientation on 10/16/2017 at 1:30pm. Patient will attend the 1:30pm exc class. Mailed packet.

## 2017-09-17 ENCOUNTER — Ambulatory Visit: Payer: BLUE CROSS/BLUE SHIELD | Admitting: Pulmonary Disease

## 2017-09-17 ENCOUNTER — Other Ambulatory Visit: Payer: Self-pay

## 2017-09-17 DIAGNOSIS — R0602 Shortness of breath: Secondary | ICD-10-CM

## 2017-09-18 ENCOUNTER — Ambulatory Visit: Payer: BLUE CROSS/BLUE SHIELD | Admitting: Pulmonary Disease

## 2017-09-18 DIAGNOSIS — G4733 Obstructive sleep apnea (adult) (pediatric): Secondary | ICD-10-CM | POA: Diagnosis not present

## 2017-09-21 DIAGNOSIS — M4722 Other spondylosis with radiculopathy, cervical region: Secondary | ICD-10-CM | POA: Diagnosis not present

## 2017-09-21 DIAGNOSIS — M4726 Other spondylosis with radiculopathy, lumbar region: Secondary | ICD-10-CM | POA: Diagnosis not present

## 2017-09-21 DIAGNOSIS — M5416 Radiculopathy, lumbar region: Secondary | ICD-10-CM | POA: Diagnosis not present

## 2017-09-24 ENCOUNTER — Telehealth: Payer: Self-pay | Admitting: Physician Assistant

## 2017-09-24 ENCOUNTER — Ambulatory Visit: Payer: BLUE CROSS/BLUE SHIELD

## 2017-09-24 DIAGNOSIS — R350 Frequency of micturition: Secondary | ICD-10-CM

## 2017-09-24 DIAGNOSIS — N390 Urinary tract infection, site not specified: Secondary | ICD-10-CM | POA: Diagnosis not present

## 2017-09-24 MED ORDER — SULFAMETHOXAZOLE-TRIMETHOPRIM 800-160 MG PO TABS: 1.0000 | ORAL_TABLET | Freq: Two times a day (BID) | ORAL | 0 refills | Status: DC

## 2017-09-24 NOTE — Telephone Encounter (Signed)
-----   Message from Elenor Quinones, [place] sent at 09/24/2017 11:50 AM EDT ----- Regarding: POSS UTI Pt reports lower back pain, bladder pressure, urinary freq for 3wks now. Pt reports that this feels just like a UTI. Pt has already dropped off a urine sample as well for U/A & U/C.  Please advise.

## 2017-09-24 NOTE — Progress Notes (Signed)
Pt reports lower back pain, bladder pressure, urinary freq for 3wks now. Pt reports that this feels just like a UTI. Please advise.

## 2017-09-24 NOTE — Addendum Note (Signed)
Addended by: Dolores Hoose on: 09/24/2017 12:17 PM   Modules accepted: Orders

## 2017-09-24 NOTE — Telephone Encounter (Signed)
Patient has given urine We will send in bactrim ABX If any worsening symptoms will go to the ER

## 2017-09-24 NOTE — Telephone Encounter (Signed)
  LVM  Informing pt of ABX & ER visit if he gets worse. May 2nd 2019 by DD

## 2017-09-25 LAB — URINALYSIS, ROUTINE W REFLEX MICROSCOPIC
Bilirubin Urine: NEGATIVE
GLUCOSE, UA: NEGATIVE
HGB URINE DIPSTICK: NEGATIVE
LEUKOCYTES UA: NEGATIVE
Nitrite: NEGATIVE
PROTEIN: NEGATIVE
Specific Gravity, Urine: 1.033 (ref 1.001–1.03)

## 2017-09-25 LAB — URINE CULTURE
MICRO NUMBER: 90539072
SPECIMEN QUALITY:: ADEQUATE

## 2017-10-01 DIAGNOSIS — G43709 Chronic migraine without aura, not intractable, without status migrainosus: Secondary | ICD-10-CM | POA: Diagnosis not present

## 2017-10-05 DIAGNOSIS — N3941 Urge incontinence: Secondary | ICD-10-CM | POA: Diagnosis not present

## 2017-10-05 DIAGNOSIS — E291 Testicular hypofunction: Secondary | ICD-10-CM | POA: Diagnosis not present

## 2017-10-05 DIAGNOSIS — N401 Enlarged prostate with lower urinary tract symptoms: Secondary | ICD-10-CM | POA: Diagnosis not present

## 2017-10-16 ENCOUNTER — Encounter (HOSPITAL_COMMUNITY): Payer: Self-pay

## 2017-10-16 ENCOUNTER — Encounter (HOSPITAL_COMMUNITY)
Admission: RE | Admit: 2017-10-16 | Discharge: 2017-10-16 | Disposition: A | Discharge status: Home / Self Care | Payer: BLUE CROSS/BLUE SHIELD | Source: Ambulatory Visit | Attending: Pulmonary Disease | Admitting: Pulmonary Disease

## 2017-10-16 VITALS — BP 132/71 | HR 80 | Temp 98.6°F | Resp 14 | Ht 71.5 in | Wt >= 6400 oz

## 2017-10-16 DIAGNOSIS — J45998 Other asthma: Secondary | ICD-10-CM | POA: Diagnosis not present

## 2017-10-16 DIAGNOSIS — J45909 Unspecified asthma, uncomplicated: Secondary | ICD-10-CM

## 2017-10-16 NOTE — Progress Notes (Signed)
Mark Harrell 57 y.o. male Pulmonary Rehab Orientation Note Patient arrived today in [facility] for orientation to Pulmonary Rehab. He was transported from [employer] via wheel chair. He does carry portable oxygen. Per pt, he uses oxygen occasionally when he is short of breath. Color good, skin warm and dry. Patient is oriented to time and place. Patient's medical history, psychosocial health, and medications reviewed. Psychosocial assessment reveals pt lives with their family which is his uncle and mother who has dementia.  Pt is currently semi- retired. Pt hobbies include reading. Pt reports his stress level is high. Areas of stress/anxiety include Family.Pt father recent diagnosis with bone marrow disease. Pt mother has dementia and his sister is on drugs.  Signs of depression include helplessness and hopelessness and difficulty falling asleep, difficulty maintaining sleep, early morning awakenings and fatigue. PHQ2/9 score 2/14. Pt shows fair  coping skills with somewhat positive outlook.  Pt remains hopeful things change but is not sure how long this will be and whether he truly believes it will change.  Pt offered emotional support and reassurance. Offered for pt to speak with Mark Harrell in [place].  Pt declined but will give it some thought as an option if participating in exercise doesn't change his disposition. Will continue to monitor and evaluate progress toward psychosocial goal(s) of feeling confident to live on his own. Pt presently lives with his uncle for fear of being alone and something happens. Physical assessment reveals heart rate is normal, breath sounds clear to auscultation, no wheezes, rales, or rhonchi, diminished though clear. Pt is very obese which has limited the ability to auscultate breath sounds.. Grip strength equal, strong. Distal pulses palpable with 1+ pitting edema. Patient reports he does take medications as prescribed. Patient states he follows a  Regular diet. Pt would like to lose weight and has a sudden weight gain due to steriodal  Patient's weight will be monitored closely. Demonstration and practice of PLB using pulse oximeter. Patient able to return demonstration satisfactorily. Safety and hand hygiene in the exercise area reviewed with patient. Patient voices understanding of the information reviewed. Department expectations discussed with patient and achievable goals were set. The patient shows enthusiasm about attending the program and we look forward to working with this nice gentlemen. The patient is scheduled for a 6 min walk test on 5/28 at 3:30 and to begin exercise on 6/4.  45 minutes was spent on a variety of activities such as assessment of the patient, obtaining baseline data including height, weight, BMI, and grip strength, verifying medical history, allergies, and current medications, and teaching patient strategies for performing tasks with less respiratory effort with emphasis on pursed lip breathing. [place], BSN Cardiac and Pulmonary Rehab Nurse Navigator

## 2017-10-18 DIAGNOSIS — G4733 Obstructive sleep apnea (adult) (pediatric): Secondary | ICD-10-CM | POA: Diagnosis not present

## 2017-10-20 ENCOUNTER — Encounter (HOSPITAL_COMMUNITY)
Admission: RE | Admit: 2017-10-20 | Discharge: 2017-10-20 | Disposition: A | Discharge status: Home / Self Care | Payer: BLUE CROSS/BLUE SHIELD | Source: Ambulatory Visit | Attending: Pulmonary Disease | Admitting: Pulmonary Disease

## 2017-10-20 DIAGNOSIS — J45909 Unspecified asthma, uncomplicated: Secondary | ICD-10-CM

## 2017-10-20 DIAGNOSIS — J45998 Other asthma: Secondary | ICD-10-CM | POA: Diagnosis not present

## 2017-10-22 NOTE — Progress Notes (Signed)
Pulmonary Individual Treatment Plan  Patient Details  Name: Mark Harrell MRN: [id number] Date of Birth: Jul 08, 1960 Referring Provider:     Pulmonary Rehab Walk Test from 10/20/2017 in [place]  Referring Provider  Dr. [name]      Initial Encounter Date:    Pulmonary Rehab Walk Test from 10/20/2017 in [place]  Date  10/20/17  Referring Provider  Dr. [name]      Visit Diagnosis: Severe asthma, unspecified whether complicated, unspecified whether persistent  Patient's Home Medications on Admission:   Current Outpatient Medications:  .  albuterol (PROVENTIL HFA;VENTOLIN HFA) 108 (90 Base) MCG/ACT inhaler, 1 to 2 inhalations 10 to 15 minutes apart every 4 hrs to rescue Asthma, Disp: 60 g, Rfl: 1 .  albuterol (PROVENTIL) (2.5 MG/3ML) 0.083% nebulizer solution, Inhale 2.5 mg into the lungs every 4 (four) hours as needed., Disp: , Rfl: 12 .  alfuzosin (UROXATRAL) 10 MG 24 hr tablet, Take 10 mg by mouth daily.  , Disp: , Rfl:  .  baclofen (LIORESAL) 10 MG tablet, Take 10 mg by mouth 3 (three) times daily as needed for muscle spasms. , Disp: , Rfl:  .  benzonatate (TESSALON) 200 MG capsule, TAKE ONE CAPSULE 3 TIMES A DAY AS NEEDED FOR COUGH, Disp: 90 capsule, Rfl: 0 .  bisoprolol-hydrochlorothiazide (ZIAC) 5-6.25 MG tablet, Take 1 tablet by mouth daily., Disp: 90 tablet, Rfl: 1 .  Calcium Carbonate-Vitamin D (CALCIUM + D PO), Take 500 mg by mouth 2 (two) times daily. , Disp: , Rfl:  .  cetaphil (CETAPHIL) lotion, Apply topically 2 (two) times daily., Disp: 236 mL, Rfl: 0 .  cetirizine (ZYRTEC) 10 MG tablet, Take 10 mg by mouth daily., Disp: , Rfl:  .  chlorpheniramine-HYDROcodone (TUSSIONEX) 10-8 MG/5ML SUER, Take 5 mLs every 12 (twelve) hours by mouth. (Patient not taking: Reported on 10/16/2017), Disp: 140 mL, Rfl: 0 .  Cholecalciferol (VITAMIN D PO), Take 10,000-20,000 Units by mouth 2 (two) times daily. 20000 in the morning  and 10000 in the evening, Disp: , Rfl:  .  clotrimazole-betamethasone (LOTRISONE) cream, APPLY TO AFFECTED AREA TWICE A DAY, Disp: 15 g, Rfl: 2 .  co-enzyme Q-10 30 MG capsule, Take 30 mg by mouth daily., Disp: , Rfl:  .  cyclobenzaprine (FLEXERIL) 10 MG tablet, Take 10 mg by mouth 3 (three) times daily. , Disp: , Rfl:  .  desmopressin (DDAVP) 0.2 MG tablet, Take 200 mcg by mouth daily. , Disp: , Rfl: 3 .  diclofenac (VOLTAREN) 75 MG EC tablet, Take 1 tablet (75 mg total) by mouth 2 (two) times daily., Disp: 180 tablet, Rfl: 1 .  diclofenac sodium (VOLTAREN) 1 % GEL, APPLY 4 GRAMS TOPICALLY 4 TIMES DAILY, Disp: 100 g, Rfl: 3 .  donepezil (ARICEPT) 23 MG TABS tablet, Take 23 mg by mouth at bedtime., Disp: , Rfl:  .  DYMISTA 137-50 MCG/ACT SUSP, PLACE 2 SPRAYS INTO BOTH NOSTRILS 2 (TWO) TIMES DAILY., Disp: , Rfl: 5 .  DYMISTA 137-50 MCG/ACT SUSP, PLACE 2 SPRAYS INTO BOTH NOSTRILS 2 (TWO) TIMES DAILY., Disp: 23 g, Rfl: 3 .  Eluxadoline (VIBERZI) 100 MG TABS, Take 1 tablet by mouth 2 (two) times daily. , Disp: , Rfl:  .  esomeprazole (NEXIUM) 40 MG capsule, TAKE 1 CAPSULE (40 MG TOTAL) BY MOUTH 2 (TWO) TIMES DAILY BEFORE A MEAL., Disp: 180 capsule, Rfl: 1 .  Fluticasone-Umeclidin-Vilant (TRELEGY ELLIPTA) 100-62.5-25 MCG/INH AEPB, Inhale 1 puff into the lungs  daily., Disp: 28 each, Rfl: 5 .  Galcanezumab-gnlm (EMGALITY) 120 MG/ML SOAJ, Inject into the skin. Pt receives this monthly ingection, Disp: , Rfl:  .  guaiFENesin (MUCINEX) 600 MG 12 hr tablet, Take 600 mg by mouth 2 (two) times daily as needed for cough or to loosen phlegm. Instructed to take 3 tablets in the morning, Disp: , Rfl:  .  HYDROcodone-homatropine (HYCODAN) 5-1.5 MG/5ML syrup, Take 5 mLs by mouth every 6 (six) hours as needed for cough. Do not take with other opioids such as tramadol. (Patient not taking: Reported on 10/16/2017), Disp: 120 mL, Rfl: 0 .  LUTEIN PO, Take 1 tablet by mouth 2 (two) times daily., Disp: , Rfl:  .  LYRICA 150  MG capsule, Take 300 mg by mouth 2 (two) times daily., Disp: , Rfl: 1 .  memantine (NAMENDA) 10 MG tablet, Take 10 mg by mouth 2 (two) times daily. , Disp: , Rfl:  .  Methylfol-Algae-B12-Acetylcyst (CEREFOLIN NAC) 6-90.314-2-600 MG TABS, Take 1 tablet by mouth daily. , Disp: , Rfl:  .  montelukast (SINGULAIR) 10 MG tablet, TAKE 1 TABLET (10 MG TOTAL) BY MOUTH DAILY., Disp: 90 tablet, Rfl: 1 .  Multiple Vitamins-Minerals (MULTIVITAMIN WITH MINERALS) tablet, Take 1 tablet by mouth daily., Disp: , Rfl:  .  ondansetron (ZOFRAN-ODT) 4 MG disintegrating tablet, TAKE 1 TABLET (4 MG TOTAL) BY MOUTH EVERY 6 (SIX) HOURS AS NEEDED FOR NAUSEA OR VOMITING., Disp: 30 tablet, Rfl: 0 .  oxyCODONE-acetaminophen (PERCOCET) 10-325 MG tablet, Take 1 tablet by mouth every 4 (four) hours as needed for pain. (Patient not taking: Reported on 10/16/2017), Disp: 60 tablet, Rfl: 0 .  oxymetazoline (AFRIN) 0.05 % nasal spray, Place 1 spray into both nostrils See admin instructions. 1 SPRAY PER SIDE EACH NIGHT BEFORE DYMISTA, Disp: , Rfl:  .  pentosan polysulfate (ELMIRON) 100 MG capsule, Take 100 mg by mouth 3 (three) times daily before meals. , Disp: , Rfl:  .  Phosphatidylserine-DHA-EPA (VAYACOG PO), Take 1 capsule by mouth 2 (two) times daily., Disp: , Rfl:  .  Potassium 99 MG TABS, Take 1 tablet by mouth daily., Disp: , Rfl:  .  pravastatin (PRAVACHOL) 40 MG tablet, TAKE 1 TABLET BY MOUTH AT BEDTIME, Disp: 90 tablet, Rfl: 1 .  PRESCRIPTION MEDICATION, Pt receives weekly allergy shots, Disp: , Rfl:  .  Probiotic Product (VSL#3 DS) PACK, Take 2 each by mouth daily., Disp: , Rfl:  .  ranitidine (ZANTAC) 150 MG tablet, Take 1 tablet (150 mg total) by mouth 2 (two) times daily., Disp: 190 tablet, Rfl: 1 .  sodium chloride (OCEAN) 0.65 % SOLN nasal spray, Place 1 spray into both nostrils 4 (four) times daily as needed for congestion. Uses each time before other nasal sprays, Disp: , Rfl:  .  sulfamethoxazole-trimethoprim (BACTRIM  DS,SEPTRA DS) 800-160 MG tablet, Take 1 tablet by mouth 2 (two) times daily., Disp: 20 tablet, Rfl: 0 .  tadalafil (CIALIS) 5 MG tablet, Take 5 mg by mouth every evening. , Disp: , Rfl:  .  traMADol (ULTRAM) 50 MG tablet, TAKE 1 TABLET EVERY 6 HOURS AS NEEDED FOR COUGH, Disp: 60 tablet, Rfl: 0 .  triamcinolone cream (KENALOG) 0.1 %, APPLY TOPICALLY 4 TIMES A DAY (Patient taking differently: APPLY TOPICALLY 2 TIMES A DAY AS NEEDED for itching), Disp: 30 g, Rfl: 1 .  trospium (SANCTURA) 20 MG tablet, Take 20 mg by mouth 2 (two) times daily. , Disp: , Rfl: 2  Current Facility-Administered Medications:  .  Mepolizumab SOLR 100 mg, 100 mg, Subcutaneous, Q28 days, [name], MD, 100 mg at 05/11/17 1546  Past Medical History: Past Medical History:  Diagnosis Date  . Anesthesia complication requiring reversal agent administration    ? from central apnea, very difficult to get off vent  . Anxiety   . Arthritis    osteo  . Asthma   . BPH (benign prostatic hyperplasia)   . Complication of anesthesia    difficulty waking , they twlight me because of my respiratory problems "  . Dyspnea   . Enlarged heart   . Family history of adverse reaction to anesthesia    mother trouble waking up, and heart stopped  . GERD (gastroesophageal reflux disease)   . Headache    botox injections for headaches  . Hyperlipidemia   . Hypertension   . Hypogonadism male   . IBS (irritable bowel syndrome)   . Obesity   . OSA (obstructive sleep apnea)    cpap  . Pneumonia   . Pre-diabetes   . Prostatitis     Tobacco Use: Social History   Tobacco Use  Smoking Status Former Smoker  . Packs/day: 0.10  . Years: 15.00  . Pack years: 1.50  Smokeless Tobacco Never Used  Tobacco Comment   significant second-hand exposure through mother    Labs: Recent Review Flowsheet Data    Labs for ITP Cardiac and Pulmonary Rehab Latest Ref Rng & Units 10/02/2016 01/08/2017 02/15/2017 04/14/2017 07/20/2017    Cholestrol <200 mg/dL 128 110 - 122 137   LDLCALC mg/dL (calc) 52 54 - 64 64   HDL >40 mg/dL 31(L) 33(L) - 26(L) 48   Trlycerides <150 mg/dL 223(H) 147 - 308(H) 169(H)   Hemoglobin A1c <5.7 % of total Hgb 5.5 - - 6.1(H) 6.6(H)   PHART 7.350 - 7.450 - - - - -   PCO2ART 32.0 - 48.0 mmHg - - - - -   HCO3 20.0 - 28.0 mmol/L - - 32.0(H) - -   TCO2 22 - 32 mmol/L - - 34(H) - -   O2SAT % - - 80.0 - -      Capillary Blood Glucose: Lab Results  Component Value Date   GLUCAP 116 (H) 02/18/2017   GLUCAP 96 02/18/2017   GLUCAP 135 (H) 02/17/2017   GLUCAP 192 (H) 02/17/2017   GLUCAP 191 (H) 02/17/2017     Pulmonary Assessment Scores: Pulmonary Assessment Scores    Row Name 10/22/17 0902         ADL UCSD   ADL Phase  Entry       mMRC Score   mMRC Score  4        Pulmonary Function Assessment:   Exercise Target Goals: Date: 10/20/17  Exercise Program Goal: Individual exercise prescription set using results from initial 6 min walk test and THRR while considering  patient's activity barriers and safety.    Exercise Prescription Goal: Initial exercise prescription builds to 30-45 minutes a day of aerobic activity, 2-3 days per week.  Home exercise guidelines will be given to patient during program as part of exercise prescription that the participant will acknowledge.  Activity Barriers & Risk Stratification: Activity Barriers & Cardiac Risk Stratification - 10/16/17 1458      Activity Barriers & Cardiac Risk Stratification   Activity Barriers  Back Problems;Balance Concerns;Deconditioning;Arthritis;Shortness of Breath;Assistive Device multiple of orthopedic stressors    Cardiac Risk Stratification  High       6 Minute Walk: 6  Minute Walk    Row Name 10/22/17 0852         6 Minute Walk   Phase  Initial     Distance  600 feet     Walk Time  6 minutes     # of Rest Breaks  0     MPH  1.13     METS  1.84     RPE  13     Perceived Dyspnea   1     Symptoms  Yes  (comment)     Comments  7/10 BACK PAIN     Resting HR  96 bpm     Resting BP  160/88     Resting Oxygen Saturation   89 %     Exercise Oxygen Saturation  during 6 min walk  87 %     Max Ex. HR  117 bpm     Max Ex. BP  140/94       Interval HR   1 Minute HR  97     2 Minute HR  107     3 Minute HR  106     4 Minute HR  111     5 Minute HR  117     6 Minute HR  108     2 Minute Post HR  95     Interval Heart Rate?  Yes       Interval Oxygen   Interval Oxygen?  Yes     Baseline Oxygen Saturation %  89 %     1 Minute Oxygen Saturation %  89 %     1 Minute Liters of Oxygen  0 L     2 Minute Oxygen Saturation %  87 %     2 Minute Liters of Oxygen  0 L     3 Minute Oxygen Saturation %  89 %     3 Minute Liters of Oxygen  2 L     4 Minute Oxygen Saturation %  91 %     4 Minute Liters of Oxygen  2 L     5 Minute Oxygen Saturation %  90 %     5 Minute Liters of Oxygen  2 L     6 Minute Oxygen Saturation %  91 %     6 Minute Liters of Oxygen  2 L     2 Minute Post Oxygen Saturation %  95 %     2 Minute Post Liters of Oxygen  2 L        Oxygen Initial Assessment: Oxygen Initial Assessment - 10/22/17 0851      Initial 6 min Walk   Oxygen Used  Continuous;E-Tanks    Liters per minute  2      Program Oxygen Prescription   Program Oxygen Prescription  Continuous;E-Tanks    Liters per minute  2       Oxygen Re-Evaluation:   Oxygen Discharge (Final Oxygen Re-Evaluation):   Initial Exercise Prescription: Initial Exercise Prescription - 10/22/17 0800      Date of Initial Exercise RX and Referring Provider   Date  10/20/17    Referring Provider  Dr. [name]      Oxygen   Oxygen  Continuous    Liters  2      NuStep   Level  1    SPM  80    Minutes  34    METs  1.5  Track   Laps  5    Minutes  17      Prescription Details   Frequency (times per week)  2    Duration  Progress to 45 minutes of aerobic exercise without signs/symptoms of physical distress       Intensity   THRR 40-80% of Max Heartrate  66-132    Ratings of Perceived Exertion  11-13    Perceived Dyspnea  0-4      Progression   Progression  Continue progressive overload as per policy without signs/symptoms or physical distress.      Resistance Training   Training Prescription  Yes    Weight  blue bands    Reps  10-15       Perform Capillary Blood Glucose checks as needed.  Exercise Prescription Changes:   Exercise Comments:   Exercise Goals and Review: Exercise Goals    Row Name 10/16/17 1459             Exercise Goals   Increase Physical Activity  Yes       Intervention  Provide advice, education, support and counseling about physical activity/exercise needs.;Develop an individualized exercise prescription for aerobic and resistive training based on initial evaluation findings, risk stratification, comorbidities and participant's personal goals.       Expected Outcomes  Short Term: Attend rehab on a regular basis to increase amount of physical activity.;Long Term: Add in home exercise to make exercise part of routine and to increase amount of physical activity.;Long Term: Exercising regularly at least 3-5 days a week.       Increase Strength and Stamina  Yes       Intervention  Provide advice, education, support and counseling about physical activity/exercise needs.;Develop an individualized exercise prescription for aerobic and resistive training based on initial evaluation findings, risk stratification, comorbidities and participant's personal goals.       Expected Outcomes  Short Term: Increase workloads from initial exercise prescription for resistance, speed, and METs.;Short Term: Perform resistance training exercises routinely during rehab and add in resistance training at home;Long Term: Improve cardiorespiratory fitness, muscular endurance and strength as measured by increased METs and functional capacity (6MWT)       Able to understand and use rate of  perceived exertion (RPE) scale  Yes       Intervention  Provide education and explanation on how to use RPE scale       Expected Outcomes  Short Term: Able to use RPE daily in rehab to express subjective intensity level;Long Term:  Able to use RPE to guide intensity level when exercising independently       Able to understand and use Dyspnea scale  Yes       Intervention  Provide education and explanation on how to use Dyspnea scale       Expected Outcomes  Short Term: Able to use Dyspnea scale daily in rehab to express subjective sense of shortness of breath during exertion;Long Term: Able to use Dyspnea scale to guide intensity level when exercising independently       Intervention  Provide education and explanation of THRR including how the numbers were predicted and where they are located for reference       Expected Outcomes  Long Term: Able to use THRR to govern intensity when exercising independently;Short Term: Able to state/look up THRR;Short Term: Able to use daily as guideline for intensity in rehab       Understanding of Exercise Prescription  Yes  Intervention  Provide education, explanation, and written materials on patient's individual exercise prescription       Expected Outcomes  Short Term: Able to explain program exercise prescription;Long Term: Able to explain home exercise prescription to exercise independently          Exercise Goals Re-Evaluation :   Discharge Exercise Prescription (Final Exercise Prescription Changes):   Nutrition:  Target Goals: Understanding of nutrition guidelines, daily intake of sodium 1500mg , cholesterol 200mg , calories 30% from fat and 7% or less from saturated fats, daily to have 5 or more servings of fruits and vegetables.  Biometrics:    Nutrition Therapy Plan and Nutrition Goals:   Nutrition Assessments:   Nutrition Goals Re-Evaluation:   Nutrition Goals Discharge (Final Nutrition Goals  Re-Evaluation):   Psychosocial: Target Goals: Acknowledge presence or absence of significant depression and/or stress, maximize coping skills, provide positive support system. Participant is able to verbalize types and ability to use techniques and skills needed for reducing stress and depression.  Initial Review & Psychosocial Screening: Initial Psych Review & Screening - 10/16/17 1450      Initial Review   Current issues with  Current Stress Concerns;Current Depression;Current Anxiety/Panic;Current Sleep Concerns    Source of Stress Concerns  Chronic Illness;Family;Unable to participate in former interests or hobbies;Unable to perform yard/household activities    Comments  Pt living with his uncle. Pt feels he can not live alone due to his health issues.  Goal of year to return to him.      Barriers   Psychosocial barriers to participate in program  The patient should benefit from training in stress management and relaxation.      Screening Interventions   Interventions  Encouraged to exercise    Expected Outcomes  Long Term Goal: Stressors or current issues are controlled or eliminated.;Short Term goal: Identification and review with participant of any Quality of Life or Depression concerns found by scoring the questionnaire.       Quality of Life Scores:  Scores of 19 and below usually indicate a poorer quality of life in these areas.  A difference of  2-3 points is a clinically meaningful difference.  A difference of 2-3 points in the total score of the Quality of Life Index has been associated with significant improvement in overall quality of life, self-image, physical symptoms, and general health in studies assessing change in quality of life.   PHQ-9: Recent Review Flowsheet Data    Depression screen [facility] 2/9 10/16/2017 04/14/2017 10/04/2016 04/23/2016 08/13/2015   Decreased Interest 0 0 0 0 0   Down, Depressed, Hopeless 2 0 0 0 0   PHQ - 2 Score 2 0 0 0 0   Altered sleeping 3  - - - -   Tired, decreased energy 3 - - - -   Change in appetite 2 - - - -   Feeling bad or failure about yourself  1 - - - -   Trouble concentrating 2 - - - -   Moving slowly or fidgety/restless 1 - - - -   Suicidal thoughts 0 - - - -   PHQ-9 Score 14 - - - -   Difficult doing work/chores Somewhat difficult - - - -     Interpretation of Total Score  Total Score Depression Severity:  1-4 = Minimal depression, 5-9 = Mild depression, 10-14 = Moderate depression, 15-19 = Moderately severe depression, 20-27 = Severe depression   Psychosocial Evaluation and Intervention: Psychosocial Evaluation - 10/16/17 1456  Psychosocial Evaluation & Interventions   Interventions  Stress management education;Relaxation education;Encouraged to exercise with the program and follow exercise prescription    Continue Psychosocial Services   Follow up required by staff       Psychosocial Re-Evaluation:   Psychosocial Discharge (Final Psychosocial Re-Evaluation):   Education: Education Goals: Education classes will be provided on a weekly basis, covering required topics. Participant will state understanding/return demonstration of topics presented.  Learning Barriers/Preferences: Learning Barriers/Preferences - 10/16/17 1456      Learning Barriers/Preferences   Learning Barriers  Sight;Hearing;Exercise Concerns    Learning Preferences  Verbal Instruction;Written Material;Individual Instruction;Group Instruction;Computer/Internet       Education Topics: Risk Factor Reduction:  -Group instruction that is supported by a PowerPoint presentation. Instructor discusses the definition of a risk factor, different risk factors for pulmonary disease, and how the heart and lungs work together.     Nutrition for Pulmonary Patient:  -Group instruction provided by PowerPoint slides, verbal discussion, and written materials to support subject matter. The instructor gives an explanation and review of  healthy diet recommendations, which includes a discussion on weight management, recommendations for fruit and vegetable consumption, as well as protein, fluid, caffeine, fiber, sodium, sugar, and alcohol. Tips for eating when patients are short of breath are discussed.   Pursed Lip Breathing:  -Group instruction that is supported by demonstration and informational handouts. Instructor discusses the benefits of pursed lip and diaphragmatic breathing and detailed demonstration on how to preform both.     Oxygen Safety:  -Group instruction provided by PowerPoint, verbal discussion, and written material to support subject matter. There is an overview of "What is Oxygen" and "Why do we need it".  Instructor also reviews how to create a safe environment for oxygen use, the importance of using oxygen as prescribed, and the risks of noncompliance. There is a brief discussion on traveling with oxygen and resources the patient may utilize.   Oxygen Equipment:  -Group instruction provided by [facility] Staff utilizing handouts, written materials, and equipment demonstrations.   Signs and Symptoms:  -Group instruction provided by written material and verbal discussion to support subject matter. Warning signs and symptoms of infection, stroke, and heart attack are reviewed and when to call the physician/911 reinforced. Tips for preventing the spread of infection discussed.   Advanced Directives:  -Group instruction provided by verbal instruction and written material to support subject matter. Instructor reviews Advanced Directive laws and proper instruction for filling out document.   Pulmonary Video:  -Group video education that reviews the importance of medication and oxygen compliance, exercise, good nutrition, pulmonary hygiene, and pursed lip and diaphragmatic breathing for the pulmonary patient.   Exercise for the Pulmonary Patient:  -Group instruction that is supported by a PowerPoint  presentation. Instructor discusses benefits of exercise, core components of exercise, frequency, duration, and intensity of an exercise routine, importance of utilizing pulse oximetry during exercise, safety while exercising, and options of places to exercise outside of rehab.     Pulmonary Medications:  -Verbally interactive group education provided by instructor with focus on inhaled medications and proper administration.   Anatomy and Physiology of the Respiratory System and Intimacy:  -Group instruction provided by PowerPoint, verbal discussion, and written material to support subject matter. Instructor reviews respiratory cycle and anatomical components of the respiratory system and their functions. Instructor also reviews differences in obstructive and restrictive respiratory diseases with examples of each. Intimacy, Sex, and Sexuality differences are reviewed with a discussion on how relationships  can change when diagnosed with pulmonary disease. Common sexual concerns are reviewed.   MD DAY -A group question and answer session with a medical doctor that allows participants to ask questions that relate to their pulmonary disease state.   OTHER EDUCATION -Group or individual verbal, written, or video instructions that support the educational goals of the pulmonary rehab program.   Holiday Eating Survival Tips:  -Group instruction provided by PowerPoint slides, verbal discussion, and written materials to support subject matter. The instructor gives patients tips, tricks, and techniques to help them not only survive but enjoy the holidays despite the onslaught of food that accompanies the holidays.   Knowledge Questionnaire Score:   Core Components/Risk Factors/Patient Goals at Admission: Personal Goals and Risk Factors at Admission - 10/16/17 1446      Core Components/Risk Factors/Patient Goals on Admission    Weight Management  Obesity;Yes    Intervention  Weight Management:  Develop a combined nutrition and exercise program designed to reach desired caloric intake, while maintaining appropriate intake of nutrient and fiber, sodium and fats, and appropriate energy expenditure required for the weight goal.;Weight Management: Provide education and appropriate resources to help participant work on and attain dietary goals.;Weight Management/Obesity: Establish reasonable short term and long term weight goals.;Obesity: Provide education and appropriate resources to help participant work on and attain dietary goals.    Goal Weight: Long Term  200 lb (90.7 kg)    Expected Outcomes  Short Term: Continue to assess and modify interventions until short term weight is achieved;Weight Maintenance: Understanding of the daily nutrition guidelines, which includes 25-35% calories from fat, 7% or less cal from saturated fats, less than 200mg  cholesterol, less than 1.5gm of sodium, & 5 or more servings of fruits and vegetables daily;Weight Loss: Understanding of general recommendations for a balanced deficit meal plan, which promotes 1-2 lb weight loss per week and includes a negative energy balance of [id number] kcal/d;Understanding recommendations for meals to include 15-35% energy as protein, 25-35% energy from fat, 35-60% energy from carbohydrates, less than 200mg  of dietary cholesterol, 20-35 gm of total fiber daily;Understanding of distribution of calorie intake throughout the day with the consumption of 4-5 meals/snacks    Improve shortness of breath with ADL's  Yes    Intervention  Provide education, individualized exercise plan and daily activity instruction to help decrease symptoms of SOB with activities of daily living.    Expected Outcomes  Short Term: Improve cardiorespiratory fitness to achieve a reduction of symptoms when performing ADLs;Long Term: Be able to perform more ADLs without symptoms or delay the onset of symptoms    Hypertension  Yes    Intervention  Provide education on  lifestyle modifcations including regular physical activity/exercise, weight management, moderate sodium restriction and increased consumption of fresh fruit, vegetables, and low fat dairy, alcohol moderation, and smoking cessation.;Monitor prescription use compliance.    Expected Outcomes  Short Term: Continued assessment and intervention until BP is < 140/69mm HG in hypertensive participants. < 130/41mm HG in hypertensive participants with diabetes, heart failure or chronic kidney disease.;Long Term: Maintenance of blood pressure at goal levels.    Lipids  Yes    Intervention  Provide education and support for participant on nutrition & aerobic/resistive exercise along with prescribed medications to achieve LDL 70mg , HDL >40mg .    Expected Outcomes  Short Term: Participant states understanding of desired cholesterol values and is compliant with medications prescribed. Participant is following exercise prescription and nutrition guidelines.;Long Term: Cholesterol controlled with medications as prescribed,  with individualized exercise RX and with personalized nutrition plan. Value goals: LDL < 70mg , HDL > 40 mg.    Stress  Yes family stress, mother has dementia, father has bone marrow disease, sister is on drugs    Intervention  Offer individual and/or small group education and counseling on adjustment to heart disease, stress management and health-related lifestyle change. Teach and support self-help strategies.    Expected Outcomes  Short Term: Participant demonstrates changes in health-related behavior, relaxation and other stress management skills, ability to obtain effective social support, and compliance with psychotropic medications if prescribed.       Core Components/Risk Factors/Patient Goals Review:    Core Components/Risk Factors/Patient Goals at Discharge (Final Review):    ITP Comments: ITP Comments    Row Name 10/16/17 1417           ITP Comments  Dr. Jennet Maduro           Comments:

## 2017-10-27 ENCOUNTER — Encounter (HOSPITAL_COMMUNITY): Payer: Self-pay

## 2017-10-27 ENCOUNTER — Encounter (HOSPITAL_COMMUNITY)
Admission: RE | Admit: 2017-10-27 | Discharge: 2017-10-27 | Disposition: A | Discharge status: Home / Self Care | Payer: BLUE CROSS/BLUE SHIELD | Source: Ambulatory Visit | Attending: Pulmonary Disease | Admitting: Pulmonary Disease

## 2017-10-27 DIAGNOSIS — J45998 Other asthma: Secondary | ICD-10-CM | POA: Diagnosis not present

## 2017-10-27 DIAGNOSIS — J45909 Unspecified asthma, uncomplicated: Secondary | ICD-10-CM

## 2017-10-27 NOTE — Progress Notes (Signed)
Daily Session Note  Patient Details  Name: Mark Harrell MRN: [id number] Date of Birth: 02/05/1961 Referring Provider:     Pulmonary Rehab Walk Test from 10/[place]/2017 in [place]  Referring Provider  Dr. [name]      Encounter Date: 10/27/2017  Check In: Session Check In - 10/27/17 1552      Check-In   Location  [facility]    Staff Present  Rodney Langton, RN;Olinty Celesta Aver, MS, ACSM CEP, Exercise Physiologist;Carlette Wilber Oliphant, RN, BSN;Molly DiVincenzo, MS, ACSM RCEP, Exercise Physiologist    Supervising physician immediately available to respond to emergencies  [facility] immediately available    Physician(s)  Dr. Jonnie Finner    Medication changes reported      No    Fall or balance concerns reported     No    Tobacco Cessation  No Change    Warm-up and Cool-down  Performed as group-led instruction    Resistance Training Performed  Yes    VAD Patient?  No      Pain Assessment   Currently in Pain?  No/denies    Multiple Pain Sites  No       Capillary Blood Glucose: No results found for this or any previous visit (from the past 24 hour(s)).    Social History   Tobacco Use  Smoking Status Former Smoker  . Packs/day: 0.10  . Years: 15.00  . Pack years: 1.50  Smokeless Tobacco Never Used  Tobacco Comment   significant second-hand exposure through mother    Goals Met:  Exercise tolerated well No report of cardiac concerns or symptoms Strength training completed today  Goals Unmet:  Not Applicable  Comments: Service time is from 1330 to 1510    Dr. Rush Farmer is Medical Director for Pulmonary Rehab at [facility].

## 2017-10-27 NOTE — Progress Notes (Signed)
Pulmonary Individual Treatment Plan  Patient Details  Name: Mark Harrell MRN: [id number] Date of Birth: [birth year] Referring Provider:     Pulmonary Rehab Walk Test from 10/20/2017 in [place] [facility]  Referring Provider  Dr. [name]      Initial Encounter Date:    Pulmonary Rehab Walk Test from 10/20/2017 in [place]  Date  10/20/17  Referring Provider  Dr. [name]      Visit Diagnosis: Severe asthma, unspecified whether complicated, unspecified whether persistent  Patient's Home Medications on Admission:   Current Outpatient Medications:  .  albuterol (PROVENTIL HFA;VENTOLIN HFA) 108 (90 Base) MCG/ACT inhaler, 1 to 2 inhalations 10 to 15 minutes apart every 4 hrs to rescue Asthma, Disp: 60 g, Rfl: 1 .  albuterol (PROVENTIL) (2.5 MG/3ML) 0.083% nebulizer solution, Inhale 2.5 mg into the lungs every 4 (four) hours as needed., Disp: , Rfl: 12 .  alfuzosin (UROXATRAL) 10 MG 24 hr tablet, Take 10 mg by mouth daily.  , Disp: , Rfl:  .  baclofen (LIORESAL) 10 MG tablet, Take 10 mg by mouth 3 (three) times daily as needed for muscle spasms. , Disp: , Rfl:  .  benzonatate (TESSALON) 200 MG capsule, TAKE ONE CAPSULE 3 TIMES A DAY AS NEEDED FOR COUGH, Disp: 90 capsule, Rfl: 0 .  bisoprolol-hydrochlorothiazide (ZIAC) 5-6.25 MG tablet, Take 1 tablet by mouth daily., Disp: 90 tablet, Rfl: 1 .  Calcium Carbonate-Vitamin D (CALCIUM + D PO), Take 500 mg by mouth 2 (two) times daily. , Disp: , Rfl:  .  cetaphil (CETAPHIL) lotion, Apply topically 2 (two) times daily., Disp: 236 mL, Rfl: 0 .  cetirizine (ZYRTEC) 10 MG tablet, Take 10 mg by mouth daily., Disp: , Rfl:  .  chlorpheniramine-HYDROcodone (TUSSIONEX) 10-8 MG/5ML SUER, Take 5 mLs every 12 (twelve) hours by mouth. (Patient not taking: Reported on 10/16/2017), Disp: 140 mL, Rfl: 0 .  Cholecalciferol (VITAMIN D PO), Take 10,000-20,000 Units by mouth 2 (two) times daily. 20000 in the morning  and 10000 in the evening, Disp: , Rfl:  .  clotrimazole-betamethasone (LOTRISONE) cream, APPLY TO AFFECTED AREA TWICE A DAY, Disp: 15 g, Rfl: 2 .  co-enzyme Q-10 30 MG capsule, Take 30 mg by mouth daily., Disp: , Rfl:  .  cyclobenzaprine (FLEXERIL) 10 MG tablet, Take 10 mg by mouth 3 (three) times daily. , Disp: , Rfl:  .  desmopressin (DDAVP) 0.2 MG tablet, Take 200 mcg by mouth daily. , Disp: , Rfl: 3 .  diclofenac (VOLTAREN) 75 MG EC tablet, Take 1 tablet (75 mg total) by mouth 2 (two) times daily., Disp: 180 tablet, Rfl: 1 .  diclofenac sodium (VOLTAREN) 1 % GEL, APPLY 4 GRAMS TOPICALLY 4 TIMES DAILY, Disp: 100 g, Rfl: 3 .  donepezil (ARICEPT) 23 MG TABS tablet, Take 23 mg by mouth at bedtime., Disp: , Rfl:  .  DYMISTA 137-50 MCG/ACT SUSP, PLACE 2 SPRAYS INTO BOTH NOSTRILS 2 (TWO) TIMES DAILY., Disp: , Rfl: 5 .  DYMISTA 137-50 MCG/ACT SUSP, PLACE 2 SPRAYS INTO BOTH NOSTRILS 2 (TWO) TIMES DAILY., Disp: 23 g, Rfl: 3 .  Eluxadoline (VIBERZI) 100 MG TABS, Take 1 tablet by mouth 2 (two) times daily. , Disp: , Rfl:  .  esomeprazole (NEXIUM) 40 MG capsule, TAKE 1 CAPSULE (40 MG TOTAL) BY MOUTH 2 (TWO) TIMES DAILY BEFORE A MEAL., Disp: 180 capsule, Rfl: 1 .  Fluticasone-Umeclidin-Vilant (TRELEGY ELLIPTA) 100-62.5-25 MCG/INH AEPB, Inhale 1 puff into the lungs  daily., Disp: 28 each, Rfl: 5 .  Galcanezumab-gnlm (EMGALITY) 120 MG/ML SOAJ, Inject into the skin. Pt receives this monthly ingection, Disp: , Rfl:  .  guaiFENesin (MUCINEX) 600 MG 12 hr tablet, Take 600 mg by mouth 2 (two) times daily as needed for cough or to loosen phlegm. Instructed to take 3 tablets in the morning, Disp: , Rfl:  .  HYDROcodone-homatropine (HYCODAN) 5-1.5 MG/5ML syrup, Take 5 mLs by mouth every 6 (six) hours as needed for cough. Do not take with other opioids such as tramadol. (Patient not taking: Reported on 10/16/2017), Disp: 120 mL, Rfl: 0 .  LUTEIN PO, Take 1 tablet by mouth 2 (two) times daily., Disp: , Rfl:  .  LYRICA 150  MG capsule, Take 300 mg by mouth 2 (two) times daily., Disp: , Rfl: 1 .  memantine (NAMENDA) 10 MG tablet, Take 10 mg by mouth 2 (two) times daily. , Disp: , Rfl:  .  Methylfol-Algae-B12-Acetylcyst (CEREFOLIN NAC) 6-90.314-2-600 MG TABS, Take 1 tablet by mouth daily. , Disp: , Rfl:  .  montelukast (SINGULAIR) 10 MG tablet, TAKE 1 TABLET (10 MG TOTAL) BY MOUTH DAILY., Disp: 90 tablet, Rfl: 1 .  Multiple Vitamins-Minerals (MULTIVITAMIN WITH MINERALS) tablet, Take 1 tablet by mouth daily., Disp: , Rfl:  .  ondansetron (ZOFRAN-ODT) 4 MG disintegrating tablet, TAKE 1 TABLET (4 MG TOTAL) BY MOUTH EVERY 6 (SIX) HOURS AS NEEDED FOR NAUSEA OR VOMITING., Disp: 30 tablet, Rfl: 0 .  oxyCODONE-acetaminophen (PERCOCET) 10-325 MG tablet, Take 1 tablet by mouth every 4 (four) hours as needed for pain. (Patient not taking: Reported on 10/16/2017), Disp: 60 tablet, Rfl: 0 .  oxymetazoline (AFRIN) 0.05 % nasal spray, Place 1 spray into both nostrils See admin instructions. 1 SPRAY PER SIDE EACH NIGHT BEFORE DYMISTA, Disp: , Rfl:  .  pentosan polysulfate (ELMIRON) 100 MG capsule, Take 100 mg by mouth 3 (three) times daily before meals. , Disp: , Rfl:  .  Phosphatidylserine-DHA-EPA (VAYACOG PO), Take 1 capsule by mouth 2 (two) times daily., Disp: , Rfl:  .  Potassium 99 MG TABS, Take 1 tablet by mouth daily., Disp: , Rfl:  .  pravastatin (PRAVACHOL) 40 MG tablet, TAKE 1 TABLET BY MOUTH AT BEDTIME, Disp: 90 tablet, Rfl: 1 .  PRESCRIPTION MEDICATION, Pt receives weekly allergy shots, Disp: , Rfl:  .  Probiotic Product (VSL#3 DS) PACK, Take 2 each by mouth daily., Disp: , Rfl:  .  ranitidine (ZANTAC) 150 MG tablet, Take 1 tablet (150 mg total) by mouth 2 (two) times daily., Disp: 190 tablet, Rfl: 1 .  sodium chloride (OCEAN) 0.65 % SOLN nasal spray, Place 1 spray into both nostrils 4 (four) times daily as needed for congestion. Uses each time before other nasal sprays, Disp: , Rfl:  .  sulfamethoxazole-trimethoprim (BACTRIM  DS,SEPTRA DS) 800-160 MG tablet, Take 1 tablet by mouth 2 (two) times daily., Disp: 20 tablet, Rfl: 0 .  tadalafil (CIALIS) 5 MG tablet, Take 5 mg by mouth every evening. , Disp: , Rfl:  .  traMADol (ULTRAM) 50 MG tablet, TAKE 1 TABLET EVERY 6 HOURS AS NEEDED FOR COUGH, Disp: 60 tablet, Rfl: 0 .  triamcinolone cream (KENALOG) 0.1 %, APPLY TOPICALLY 4 TIMES A DAY (Patient taking differently: APPLY TOPICALLY 2 TIMES A DAY AS NEEDED for itching), Disp: 30 g, Rfl: 1 .  trospium (SANCTURA) 20 MG tablet, Take 20 mg by mouth 2 (two) times daily. , Disp: , Rfl: 2  Current Facility-Administered Medications:  .  Mepolizumab SOLR 100 mg, 100 mg, Subcutaneous, Q28 days, [name], MD, 100 mg at 05/11/17 1546  Past Medical History: Past Medical History:  Diagnosis Date  . Anesthesia complication requiring reversal agent administration    ? from central apnea, very difficult to get off vent  . Anxiety   . Arthritis    osteo  . Asthma   . BPH (benign prostatic hyperplasia)   . Complication of anesthesia    difficulty waking , they twlight me because of my respiratory problems "  . Dyspnea   . Enlarged heart   . Family history of adverse reaction to anesthesia    mother trouble waking up, and heart stopped  . GERD (gastroesophageal reflux disease)   . Headache    botox injections for headaches  . Hyperlipidemia   . Hypertension   . Hypogonadism male   . IBS (irritable bowel syndrome)   . Obesity   . OSA (obstructive sleep apnea)    cpap  . Pneumonia   . Pre-diabetes   . Prostatitis     Tobacco Use: Social History   Tobacco Use  Smoking Status Former Smoker  . Packs/day: 0.10  . Years: 15.00  . Pack years: 1.50  Smokeless Tobacco Never Used  Tobacco Comment   significant second-hand exposure through mother    Labs: Recent Review Flowsheet Data    Labs for ITP Cardiac and Pulmonary Rehab Latest Ref Rng & Units 10/02/2016 01/08/2017 02/15/2017 04/14/2017 07/20/2017    Cholestrol <200 mg/dL 128 110 - 122 137   LDLCALC mg/dL (calc) 52 54 - 64 64   HDL >40 mg/dL 31(L) 33(L) - 26(L) 48   Trlycerides <150 mg/dL 223(H) 147 - 308(H) 169(H)   Hemoglobin A1c <5.7 % of total Hgb 5.5 - - 6.1(H) 6.6(H)   PHART 7.350 - 7.450 - - - - -   PCO2ART 32.0 - 48.0 mmHg - - - - -   HCO3 20.0 - 28.0 mmol/L - - 32.0(H) - -   TCO2 22 - 32 mmol/L - - 34(H) - -   O2SAT % - - 80.0 - -      Capillary Blood Glucose: Lab Results  Component Value Date   GLUCAP 116 (H) 02/18/2017   GLUCAP 96 02/18/2017   GLUCAP 135 (H) 02/17/2017   GLUCAP 192 (H) 02/17/2017   GLUCAP 191 (H) 02/17/2017     Pulmonary Assessment Scores: Pulmonary Assessment Scores    Row Name 10/22/17 0902         ADL UCSD   ADL Phase  Entry       mMRC Score   mMRC Score  4        Pulmonary Function Assessment:   Exercise Target Goals:    Exercise Program Goal: Individual exercise prescription set using results from initial 6 min walk test and THRR while considering  patient's activity barriers and safety.    Exercise Prescription Goal: Initial exercise prescription builds to 30-45 minutes a day of aerobic activity, 2-3 days per week.  Home exercise guidelines will be given to patient during program as part of exercise prescription that the participant will acknowledge.  Activity Barriers & Risk Stratification: Activity Barriers & Cardiac Risk Stratification - 10/16/17 1458      Activity Barriers & Cardiac Risk Stratification   Activity Barriers  Back Problems;Balance Concerns;Deconditioning;Arthritis;Shortness of Breath;Assistive Device multiple of orthopedic stressors    Cardiac Risk Stratification  High       6 Minute Walk: 6  Minute Walk    Row Name 10/22/17 0852         6 Minute Walk   Phase  Initial     Distance  600 feet     Walk Time  6 minutes     # of Rest Breaks  0     MPH  1.13     METS  1.84     RPE  13     Perceived Dyspnea   1     Symptoms  Yes (comment)      Comments  7/10 BACK PAIN     Resting HR  96 bpm     Resting BP  160/88     Resting Oxygen Saturation   89 %     Exercise Oxygen Saturation  during 6 min walk  87 %     Max Ex. HR  117 bpm     Max Ex. BP  140/94       Interval HR   1 Minute HR  97     2 Minute HR  107     3 Minute HR  106     4 Minute HR  111     5 Minute HR  117     6 Minute HR  108     2 Minute Post HR  95     Interval Heart Rate?  Yes       Interval Oxygen   Interval Oxygen?  Yes     Baseline Oxygen Saturation %  89 %     1 Minute Oxygen Saturation %  89 %     1 Minute Liters of Oxygen  0 L     2 Minute Oxygen Saturation %  87 %     2 Minute Liters of Oxygen  0 L     3 Minute Oxygen Saturation %  89 %     3 Minute Liters of Oxygen  2 L     4 Minute Oxygen Saturation %  91 %     4 Minute Liters of Oxygen  2 L     5 Minute Oxygen Saturation %  90 %     5 Minute Liters of Oxygen  2 L     6 Minute Oxygen Saturation %  91 %     6 Minute Liters of Oxygen  2 L     2 Minute Post Oxygen Saturation %  95 %     2 Minute Post Liters of Oxygen  2 L        Oxygen Initial Assessment: Oxygen Initial Assessment - 10/22/17 0851      Initial 6 min Walk   Oxygen Used  Continuous;E-Tanks    Liters per minute  2      Program Oxygen Prescription   Program Oxygen Prescription  Continuous;E-Tanks    Liters per minute  2       Oxygen Re-Evaluation: Oxygen Re-Evaluation    Row Name 10/27/17 0715             Program Oxygen Prescription   Program Oxygen Prescription  Continuous;E-Tanks       Liters per minute  2         Home Oxygen   Home Oxygen Device  E-Tanks       Sleep Oxygen Prescription  None       Home Exercise Oxygen Prescription  Continuous       Liters per minute  0.5  Home at Rest Exercise Oxygen Prescription  None       Compliance with Home Oxygen Use  Yes         Goals/Expected Outcomes   Short Term Goals  To learn and exhibit compliance with exercise, home and travel O2 prescription;To  learn and understand importance of maintaining oxygen saturations>88%;To learn and demonstrate proper use of respiratory medications;To learn and understand importance of monitoring SPO2 with pulse oximeter and demonstrate accurate use of the pulse oximeter.;To learn and demonstrate proper pursed lip breathing techniques or other breathing techniques.       Long  Term Goals  Exhibits compliance with exercise, home and travel O2 prescription;Verbalizes importance of monitoring SPO2 with pulse oximeter and return demonstration;Maintenance of O2 saturations>88%;Exhibits proper breathing techniques, such as pursed lip breathing or other method taught during program session;Compliance with respiratory medication;Demonstrates proper use of MDI's       Goals/Expected Outcomes  compliance          Oxygen Discharge (Final Oxygen Re-Evaluation): Oxygen Re-Evaluation - 10/27/17 0715      Program Oxygen Prescription   Program Oxygen Prescription  Continuous;E-Tanks    Liters per minute  2      Home Oxygen   Home Oxygen Device  E-Tanks    Sleep Oxygen Prescription  None    Home Exercise Oxygen Prescription  Continuous    Liters per minute  0.5    Home at Rest Exercise Oxygen Prescription  None    Compliance with Home Oxygen Use  Yes      Goals/Expected Outcomes   Short Term Goals  To learn and exhibit compliance with exercise, home and travel O2 prescription;To learn and understand importance of maintaining oxygen saturations>88%;To learn and demonstrate proper use of respiratory medications;To learn and understand importance of monitoring SPO2 with pulse oximeter and demonstrate accurate use of the pulse oximeter.;To learn and demonstrate proper pursed lip breathing techniques or other breathing techniques.    Long  Term Goals  Exhibits compliance with exercise, home and travel O2 prescription;Verbalizes importance of monitoring SPO2 with pulse oximeter and return demonstration;Maintenance of O2  saturations>88%;Exhibits proper breathing techniques, such as pursed lip breathing or other method taught during program session;Compliance with respiratory medication;Demonstrates proper use of MDI's    Goals/Expected Outcomes  compliance       Initial Exercise Prescription: Initial Exercise Prescription - 10/22/17 0800      Date of Initial Exercise RX and Referring Provider   Date  10/20/17    Referring Provider  Dr. [name]      Oxygen   Oxygen  Continuous    Liters  2      NuStep   Level  1    SPM  80    Minutes  34    METs  1.5      Track   Laps  5    Minutes  17      Prescription Details   Frequency (times per week)  2    Duration  Progress to 45 minutes of aerobic exercise without signs/symptoms of physical distress      Intensity   THRR 40-80% of Max Heartrate  66-132    Ratings of Perceived Exertion  11-13    Perceived Dyspnea  0-4      Progression   Progression  Continue progressive overload as per policy without signs/symptoms or physical distress.      Resistance Training   Training Prescription  Yes    Weight  blue bands    Reps  10-15       Perform Capillary Blood Glucose checks as needed.  Exercise Prescription Changes:   Exercise Comments:   Exercise Goals and Review:  Exercise Goals    Row Name 10/16/17 1459             Exercise Goals   Increase Physical Activity  Yes       Intervention  Provide advice, education, support and counseling about physical activity/exercise needs.;Develop an individualized exercise prescription for aerobic and resistive training based on initial evaluation findings, risk stratification, comorbidities and participant's personal goals.       Expected Outcomes  Short Term: Attend rehab on a regular basis to increase amount of physical activity.;Long Term: Add in home exercise to make exercise part of routine and to increase amount of physical activity.;Long Term: Exercising regularly at least 3-5 days a week.        Increase Strength and Stamina  Yes       Intervention  Provide advice, education, support and counseling about physical activity/exercise needs.;Develop an individualized exercise prescription for aerobic and resistive training based on initial evaluation findings, risk stratification, comorbidities and participant's personal goals.       Expected Outcomes  Short Term: Increase workloads from initial exercise prescription for resistance, speed, and METs.;Short Term: Perform resistance training exercises routinely during rehab and add in resistance training at home;Long Term: Improve cardiorespiratory fitness, muscular endurance and strength as measured by increased METs and functional capacity (6MWT)       Able to understand and use rate of perceived exertion (RPE) scale  Yes       Intervention  Provide education and explanation on how to use RPE scale       Expected Outcomes  Short Term: Able to use RPE daily in rehab to express subjective intensity level;Long Term:  Able to use RPE to guide intensity level when exercising independently       Able to understand and use Dyspnea scale  Yes       Intervention  Provide education and explanation on how to use Dyspnea scale       Expected Outcomes  Short Term: Able to use Dyspnea scale daily in rehab to express subjective sense of shortness of breath during exertion;Long Term: Able to use Dyspnea scale to guide intensity level when exercising independently       Intervention  Provide education and explanation of THRR including how the numbers were predicted and where they are located for reference       Expected Outcomes  Long Term: Able to use THRR to govern intensity when exercising independently;Short Term: Able to state/look up THRR;Short Term: Able to use daily as guideline for intensity in rehab       Understanding of Exercise Prescription  Yes       Intervention  Provide education, explanation, and written materials on patient's individual  exercise prescription       Expected Outcomes  Short Term: Able to explain program exercise prescription;Long Term: Able to explain home exercise prescription to exercise independently          Exercise Goals Re-Evaluation : Exercise Goals Re-Evaluation    Row Name 10/27/17 0716             Exercise Goal Re-Evaluation   Exercise Goals Review  Increase Strength and Stamina;Able to understand and use Dyspnea scale;Increase Physical Activity;Able to understand and use rate of perceived exertion (RPE) scale;Knowledge and  understanding of Target Heart Rate Range (THRR);Understanding of Exercise Prescription       Comments  The patient is expected to start his first day of rehab today (10/27/17). Will cont to monitor and progress as able.        Expected Outcomes  Through exercise at rehab and at home, the patient will decrease shortness of breath with daily activities and feel confident in carrying out an exercise regime at home.           Discharge Exercise Prescription (Final Exercise Prescription Changes):   Nutrition:  Target Goals: Understanding of nutrition guidelines, daily intake of sodium 1500mg , cholesterol 200mg , calories 30% from fat and 7% or less from saturated fats, daily to have 5 or more servings of fruits and vegetables.  Biometrics:    Nutrition Therapy Plan and Nutrition Goals:   Nutrition Assessments:   Nutrition Goals Re-Evaluation:   Nutrition Goals Discharge (Final Nutrition Goals Re-Evaluation):   Psychosocial: Target Goals: Acknowledge presence or absence of significant depression and/or stress, maximize coping skills, provide positive support system. Participant is able to verbalize types and ability to use techniques and skills needed for reducing stress and depression.  Initial Review & Psychosocial Screening: Initial Psych Review & Screening - 10/16/17 1450      Initial Review   Current issues with  Current Stress Concerns;Current  Depression;Current Anxiety/Panic;Current Sleep Concerns    Source of Stress Concerns  Chronic Illness;Family;Unable to participate in former interests or hobbies;Unable to perform yard/household activities    Comments  Pt living with his uncle. Pt feels he can not live alone due to his health issues.  Goal of year to return to him.      Barriers   Psychosocial barriers to participate in program  The patient should benefit from training in stress management and relaxation.      Screening Interventions   Interventions  Encouraged to exercise    Expected Outcomes  Long Term Goal: Stressors or current issues are controlled or eliminated.;Short Term goal: Identification and review with participant of any Quality of Life or Depression concerns found by scoring the questionnaire.       Quality of Life Scores:  Scores of 19 and below usually indicate a poorer quality of life in these areas.  A difference of  2-3 points is a clinically meaningful difference.  A difference of 2-3 points in the total score of the Quality of Life Index has been associated with significant improvement in overall quality of life, self-image, physical symptoms, and general health in studies assessing change in quality of life.   PHQ-9: Recent Review Flowsheet Data    Depression screen [facility] 2/9 10/16/2017 04/14/2017 10/04/2016 04/23/2016 08/13/2015   Decreased Interest 0 0 0 0 0   Down, Depressed, Hopeless 2 0 0 0 0   PHQ - 2 Score 2 0 0 0 0   Altered sleeping 3 - - - -   Tired, decreased energy 3 - - - -   Change in appetite 2 - - - -   Feeling bad or failure about yourself  1 - - - -   Trouble concentrating 2 - - - -   Moving slowly or fidgety/restless 1 - - - -   Suicidal thoughts 0 - - - -   PHQ-9 Score 14 - - - -   Difficult doing work/chores Somewhat difficult - - - -     Interpretation of Total Score  Total Score Depression Severity:  1-4 =  Minimal depression, 5-9 = Mild depression, 10-14 = Moderate  depression, 15-19 = Moderately severe depression, 20-27 = Severe depression   Psychosocial Evaluation and Intervention: Psychosocial Evaluation - 10/27/17 1252      Psychosocial Evaluation & Interventions   Interventions  Stress management education;Relaxation education;Encouraged to exercise with the program and follow exercise prescription    Comments  Pt to start exercise today.  Will continue to monitor.    Expected Outcomes  Pt will demonstrate positive and healthy coping skills with positive outlook on his life and future.    Continue Psychosocial Services   Follow up required by staff       Psychosocial Re-Evaluation:   Psychosocial Discharge (Final Psychosocial Re-Evaluation):   Education: Education Goals: Education classes will be provided on a weekly basis, covering required topics. Participant will state understanding/return demonstration of topics presented.  Learning Barriers/Preferences: Learning Barriers/Preferences - 10/16/17 1456      Learning Barriers/Preferences   Learning Barriers  Sight;Hearing;Exercise Concerns    Learning Preferences  Verbal Instruction;Written Material;Individual Instruction;Group Instruction;Computer/Internet       Education Topics: Risk Factor Reduction:  -Group instruction that is supported by a PowerPoint presentation. Instructor discusses the definition of a risk factor, different risk factors for pulmonary disease, and how the heart and lungs work together.     Nutrition for Pulmonary Patient:  -Group instruction provided by PowerPoint slides, verbal discussion, and written materials to support subject matter. The instructor gives an explanation and review of healthy diet recommendations, which includes a discussion on weight management, recommendations for fruit and vegetable consumption, as well as protein, fluid, caffeine, fiber, sodium, sugar, and alcohol. Tips for eating when patients are short of breath are  discussed.   Pursed Lip Breathing:  -Group instruction that is supported by demonstration and informational handouts. Instructor discusses the benefits of pursed lip and diaphragmatic breathing and detailed demonstration on how to preform both.     Oxygen Safety:  -Group instruction provided by PowerPoint, verbal discussion, and written material to support subject matter. There is an overview of "What is Oxygen" and "Why do we need it".  Instructor also reviews how to create a safe environment for oxygen use, the importance of using oxygen as prescribed, and the risks of noncompliance. There is a brief discussion on traveling with oxygen and resources the patient may utilize.   Oxygen Equipment:  -Group instruction provided by [facility] Staff utilizing handouts, written materials, and equipment demonstrations.   Signs and Symptoms:  -Group instruction provided by written material and verbal discussion to support subject matter. Warning signs and symptoms of infection, stroke, and heart attack are reviewed and when to call the physician/911 reinforced. Tips for preventing the spread of infection discussed.   Advanced Directives:  -Group instruction provided by verbal instruction and written material to support subject matter. Instructor reviews Advanced Directive laws and proper instruction for filling out document.   Pulmonary Video:  -Group video education that reviews the importance of medication and oxygen compliance, exercise, good nutrition, pulmonary hygiene, and pursed lip and diaphragmatic breathing for the pulmonary patient.   Exercise for the Pulmonary Patient:  -Group instruction that is supported by a PowerPoint presentation. Instructor discusses benefits of exercise, core components of exercise, frequency, duration, and intensity of an exercise routine, importance of utilizing pulse oximetry during exercise, safety while exercising, and options of places to exercise outside  of rehab.     Pulmonary Medications:  -Verbally interactive group education provided by instructor with focus  on inhaled medications and proper administration.   Anatomy and Physiology of the Respiratory System and Intimacy:  -Group instruction provided by PowerPoint, verbal discussion, and written material to support subject matter. Instructor reviews respiratory cycle and anatomical components of the respiratory system and their functions. Instructor also reviews differences in obstructive and restrictive respiratory diseases with examples of each. Intimacy, Sex, and Sexuality differences are reviewed with a discussion on how relationships can change when diagnosed with pulmonary disease. Common sexual concerns are reviewed.   MD DAY -A group question and answer session with a medical doctor that allows participants to ask questions that relate to their pulmonary disease state.   OTHER EDUCATION -Group or individual verbal, written, or video instructions that support the educational goals of the pulmonary rehab program.   Holiday Eating Survival Tips:  -Group instruction provided by PowerPoint slides, verbal discussion, and written materials to support subject matter. The instructor gives patients tips, tricks, and techniques to help them not only survive but enjoy the holidays despite the onslaught of food that accompanies the holidays.   Knowledge Questionnaire Score:   Core Components/Risk Factors/Patient Goals at Admission: Personal Goals and Risk Factors at Admission - 10/16/17 1446      Core Components/Risk Factors/Patient Goals on Admission    Weight Management  Obesity;Yes    Intervention  Weight Management: Develop a combined nutrition and exercise program designed to reach desired caloric intake, while maintaining appropriate intake of nutrient and fiber, sodium and fats, and appropriate energy expenditure required for the weight goal.;Weight Management: Provide education  and appropriate resources to help participant work on and attain dietary goals.;Weight Management/Obesity: Establish reasonable short term and long term weight goals.;Obesity: Provide education and appropriate resources to help participant work on and attain dietary goals.    Goal Weight: Long Term  200 lb (90.7 kg)    Expected Outcomes  Short Term: Continue to assess and modify interventions until short term weight is achieved;Weight Maintenance: Understanding of the daily nutrition guidelines, which includes 25-35% calories from fat, 7% or less cal from saturated fats, less than 200mg  cholesterol, less than 1.5gm of sodium, & 5 or more servings of fruits and vegetables daily;Weight Loss: Understanding of general recommendations for a balanced deficit meal plan, which promotes 1-2 lb weight loss per week and includes a negative energy balance of [phone] kcal/d;Understanding recommendations for meals to include 15-35% energy as protein, 25-35% energy from fat, 35-60% energy from carbohydrates, less than 200mg  of dietary cholesterol, 20-35 gm of total fiber daily;Understanding of distribution of calorie intake throughout the day with the consumption of 4-5 meals/snacks    Improve shortness of breath with ADL's  Yes    Intervention  Provide education, individualized exercise plan and daily activity instruction to help decrease symptoms of SOB with activities of daily living.    Expected Outcomes  Short Term: Improve cardiorespiratory fitness to achieve a reduction of symptoms when performing ADLs;Long Term: Be able to perform more ADLs without symptoms or delay the onset of symptoms    Hypertension  Yes    Intervention  Provide education on lifestyle modifcations including regular physical activity/exercise, weight management, moderate sodium restriction and increased consumption of fresh fruit, vegetables, and low fat dairy, alcohol moderation, and smoking cessation.;Monitor prescription use compliance.     Expected Outcomes  Short Term: Continued assessment and intervention until BP is < 140/14mm HG in hypertensive participants. < 130/3mm HG in hypertensive participants with diabetes, heart failure or chronic kidney disease.;Long Term: Maintenance of  blood pressure at goal levels.    Lipids  Yes    Intervention  Provide education and support for participant on nutrition & aerobic/resistive exercise along with prescribed medications to achieve LDL 70mg , HDL >40mg .    Expected Outcomes  Short Term: Participant states understanding of desired cholesterol values and is compliant with medications prescribed. Participant is following exercise prescription and nutrition guidelines.;Long Term: Cholesterol controlled with medications as prescribed, with individualized exercise RX and with personalized nutrition plan. Value goals: LDL < 70mg , HDL > 40 mg.    Stress  Yes family stress, mother has dementia, father has bone marrow disease, sister is on drugs    Intervention  Offer individual and/or small group education and counseling on adjustment to heart disease, stress management and health-related lifestyle change. Teach and support self-help strategies.    Expected Outcomes  Short Term: Participant demonstrates changes in health-related behavior, relaxation and other stress management skills, ability to obtain effective social support, and compliance with psychotropic medications if prescribed.       Core Components/Risk Factors/Patient Goals Review:  Goals and Risk Factor Review    Row Name 10/27/17 1241             Core Components/Risk Factors/Patient Goals Review   Personal Goals Review  Weight Management/Obesity;Develop more efficient breathing techniques such as purse lipped breathing and diaphragmatic breathing and practicing self-pacing with activity.;Improve shortness of breath with ADL's;Lipids;Hypertension;Stress       Review  Pt to begin exercise today.  Unable to assess at this time        Expected Outcomes  See "admission outcomes"          Core Components/Risk Factors/Patient Goals at Discharge (Final Review):  Goals and Risk Factor Review - 10/27/17 1241      Core Components/Risk Factors/Patient Goals Review   Personal Goals Review  Weight Management/Obesity;Develop more efficient breathing techniques such as purse lipped breathing and diaphragmatic breathing and practicing self-pacing with activity.;Improve shortness of breath with ADL's;Lipids;Hypertension;Stress    Review  Pt to begin exercise today.  Unable to assess at this time    Expected Outcomes  See "admission outcomes"       ITP Comments: ITP Comments    Row Name 10/16/17 1417 10/28/17 1124         ITP Comments  Dr. Jennet Maduro  Dr. Baxter Kail, Medical Director         Comments: Pt attended 1 exercise session. Cherre Huger, BSN Cardiac and Training and development officer

## 2017-10-29 ENCOUNTER — Encounter (HOSPITAL_COMMUNITY)
Admission: RE | Admit: 2017-10-29 | Discharge: 2017-10-29 | Disposition: A | Discharge status: Home / Self Care | Payer: BLUE CROSS/BLUE SHIELD | Source: Ambulatory Visit | Attending: Pulmonary Disease | Admitting: Pulmonary Disease

## 2017-10-29 VITALS — Wt >= 6400 oz

## 2017-10-29 DIAGNOSIS — J45909 Unspecified asthma, uncomplicated: Secondary | ICD-10-CM

## 2017-10-29 DIAGNOSIS — J45998 Other asthma: Secondary | ICD-10-CM | POA: Diagnosis not present

## 2017-10-29 NOTE — Progress Notes (Signed)
Daily Session Note  Patient Details  Name: Mark Harrell MRN: [id number] Date of Birth: [birth year] Referring Provider:     Pulmonary Rehab Walk Test from 10/20/2017 in [place]  Referring Provider  Dr. [name]      Encounter Date: 10/29/2017  Check In: Session Check In - 10/29/17 1347      Check-In   Location  [facility]    Staff Present  Rodney Langton, RN;Olinty Celesta Aver, MS, ACSM CEP, Exercise Physiologist;Carlette Wilber Oliphant, RN, BSN;Molly DiVincenzo, MS, ACSM RCEP, Exercise Physiologist    Supervising physician immediately available to respond to emergencies  [facility] immediately available    Physician(s)  Dr. Tana Coast    Medication changes reported      No    Fall or balance concerns reported     No    Tobacco Cessation  No Change    Warm-up and Cool-down  Performed as group-led instruction    Resistance Training Performed  Yes    VAD Patient?  No      Pain Assessment   Currently in Pain?  No/denies    Multiple Pain Sites  No       Capillary Blood Glucose: No results found for this or any previous visit (from the past 24 hour(s)).    Social History   Tobacco Use  Smoking Status Former Smoker  . Packs/day: 0.10  . Years: 15.00  . Pack years: 1.50  Smokeless Tobacco Never Used  Tobacco Comment   significant second-hand exposure through mother    Goals Met:  Exercise tolerated well No report of cardiac concerns or symptoms Strength training completed today  Goals Unmet:  Not Applicable  Comments: Service time is from 1330 to 1505    Dr. Rush Farmer is Medical Director for Pulmonary Rehab at [facility].

## 2017-11-03 ENCOUNTER — Encounter (HOSPITAL_COMMUNITY): Payer: BLUE CROSS/BLUE SHIELD

## 2017-11-03 ENCOUNTER — Ambulatory Visit: Payer: BLUE CROSS/BLUE SHIELD | Admitting: Podiatry

## 2017-11-05 ENCOUNTER — Encounter (HOSPITAL_COMMUNITY): Payer: BLUE CROSS/BLUE SHIELD

## 2017-11-05 ENCOUNTER — Encounter: Payer: Self-pay | Admitting: Internal Medicine

## 2017-11-06 DIAGNOSIS — H25813 Combined forms of age-related cataract, bilateral: Secondary | ICD-10-CM | POA: Diagnosis not present

## 2017-11-09 ENCOUNTER — Encounter: Payer: Self-pay | Admitting: Allergy & Immunology

## 2017-11-09 ENCOUNTER — Other Ambulatory Visit: Payer: Self-pay | Admitting: Internal Medicine

## 2017-11-09 ENCOUNTER — Ambulatory Visit (INDEPENDENT_AMBULATORY_CARE_PROVIDER_SITE_OTHER): Payer: BLUE CROSS/BLUE SHIELD | Admitting: Allergy & Immunology

## 2017-11-09 VITALS — BP 140/88 | HR 80 | Temp 99.2°F | Resp 18

## 2017-11-09 DIAGNOSIS — J302 Other seasonal allergic rhinitis: Secondary | ICD-10-CM | POA: Diagnosis not present

## 2017-11-09 DIAGNOSIS — J3089 Other allergic rhinitis: Secondary | ICD-10-CM

## 2017-11-09 DIAGNOSIS — Z91030 Bee allergy status: Secondary | ICD-10-CM | POA: Diagnosis not present

## 2017-11-09 DIAGNOSIS — B999 Unspecified infectious disease: Secondary | ICD-10-CM

## 2017-11-09 DIAGNOSIS — R0981 Nasal congestion: Secondary | ICD-10-CM

## 2017-11-09 DIAGNOSIS — Z91038 Other insect allergy status: Secondary | ICD-10-CM

## 2017-11-09 DIAGNOSIS — J449 Chronic obstructive pulmonary disease, unspecified: Secondary | ICD-10-CM | POA: Diagnosis not present

## 2017-11-09 DIAGNOSIS — K219 Gastro-esophageal reflux disease without esophagitis: Secondary | ICD-10-CM

## 2017-11-09 MED ORDER — FLUTICASONE-UMECLIDIN-VILANT 100-62.5-25 MCG/INH IN AEPB: 1.0000 | INHALATION_SPRAY | Freq: Every day | RESPIRATORY_TRACT | 2 refills | Status: DC

## 2017-11-09 MED ORDER — DOXYCYCLINE HYCLATE 100 MG PO CAPS: 100.0000 mg | ORAL_CAPSULE | Freq: Two times a day (BID) | ORAL | 4 refills | Status: AC

## 2017-11-09 MED ORDER — RANITIDINE HCL 150 MG PO CAPS: 150.0000 mg | ORAL_CAPSULE | Freq: Two times a day (BID) | ORAL | 5 refills | Status: DC

## 2017-11-09 NOTE — Patient Instructions (Addendum)
1. 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  - We will start start prophylactic antibiotic and see how you do: doxycyline 100mg  twice daily - If this does not work, we will apply again for the immunoglobulin replacement.   2. Asthma-COPD overlap syndrome - We deferred lung testing today since you had a low grade fever and were not feeling well.  - We did give you a nebulizer treatment.  - Daily controller medication(s): Dupixent 300mg  every two weeks and Trelegy 100/62.5/25 one puff once daily - Prior to physical activity: ProAir 2 puffs 10-15 minutes before physical activity. - Rescue medications: ProAir 4 puffs every 4-6 hours as needed or albuterol nebulizer one vial every 4-6 hours as needed - Asthma control goals:  * Full participation in all desired activities (may need albuterol before activity) * Albuterol use two time or less a week on average (not counting use with activity) * Cough interfering with sleep two time or less a month * Oral steroids no more than once a year * No hospitalizations  3. Chronic allergic rhinitis (sweet vernal grass, box elder, cat, weeds, ragweed, molds, cockroach, dust mite) - Continue with Dymista 2 sprays per nostril 1-2 times daily. - Use Afrin 1-2 sprays prior to the Dymista.  - Continue with saline mist 1-2 times daily.  - Use cetirizine 10mg  daily as needed for breakthrough symptoms. - Continue with Karbinal 75mL twice daily as needed for runny nose.  - Resume allergy shots once you have gotten more stabilized from a respiratory perspective.     4. Sleep apnea - I would definitely resume your CPAP as tolerated. - We will refer you to back to Dr. Lucia Gaskins for evaluation for possible surgery.  - This is likely resulting in your sleepiness.   5. GERD - We will send in a prescription for  ranitidine 150mg  daily.   6. Return in about 3 months (around 02/09/2018).  Please inform us of any Emergency Department visits, hospitalizations, or changes in symptoms. Call us before going to the ED for breathing or allergy symptoms since we might be able to fit you in for a sick visit. Feel free to contact us anytime with any questions, problems, or concerns.  It was a pleasure to see you again today! Tell Ms. Bonnita Nasuti that I said hello!   Websites that have reliable patient information: 1. American Academy of Asthma, Allergy, and Immunology: www.aaaai.org 2. Food Allergy Research and Education (FARE): foodallergy.org 3. Mothers of Asthmatics: http://www.asthmacommunitynetwork.org 4. American College of Allergy, Asthma, and Immunology: www.acaai.org

## 2017-11-09 NOTE — Progress Notes (Addendum)
FOLLOW UP  Date of Service/Encounter:  11/09/17   Assessment:   Non-seasonal allergic rhinitis(sweet vernal grass, box elder, cat, weeds, ragweed, molds, cockroach, dust mite)- on allergen immunotherapy  Recurrent infections - starting prophylactic doxycycline today  Asthma-COPD overlap syndrome- on Dupixent  Hymenoptera allergy  GERD - on Nexiumand ranitidine  Obesity - with resulting nerve impingement and joint pain  Migraines - on botulinum and Emgality injections  Polypharmacy   Mark Harrell is a 57 year old gentleman with multiple comorbidities including severe persistent asthma, allergic rhinitis, and recurrent infections presenting for a follow-up visit.  At the last visit, he had been treated with 2 antibiotics without improvement and I added a third round of antibiotics, with apparent improvement after this.  In the interim, his only infection has been a UTI in May 2019.  He seems to be prone to urinary tract infections, which often lead to urosepsis.  He has not had a hospitalization in quite some time.  Unfortunately, he does continue to have a fair number of infections.  His work-up in the past has included an isolated low IgG as well as a decreased percentage of switched memory B cells.  We have been unable to get his immunoglobulin approved in the past, and according to our Biologics coordinator we need to try 3 months of prophylactic antibiotics.  Therefore, we will start doxycycline 100 mg twice daily.  I do prefer azithromycin 3 times weekly, but he has severe GI distress from this.  From an asthma-COPD overlap perspective he seems to be doing well, however he is difficult to read.  With all of his comorbidities as well as his morbid obesity, he has some underlying depression.  He does have an eosinophilic phenotype, while he did well on Nucala, he was continuing to require prednisone courses.  We did change him to [place] in April 2019 and he has done fairly  well on this.  He does have a history of sleep apnea, and has not been using his CPAP machine.  This likely has led to increased weight gain as well as fatigue.  He is in cardiopulmonary rehab, which he is optimistic about.  He remains interested in a bariatric program, but he needs to complete the 17 weeks of cardiopulmonary rehab first.  I highly recommended that he reinstate his care with [facility] (he missed his appointment with his new Pulmonologist - Mark Harrell) so that they can aid in management of his pulmonary symptoms.  Plan/Recommendations:    1. 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  - We will start start prophylactic antibiotic and see how you do: doxycyline 100mg  twice daily - If this does not work, we will apply again for the immunoglobulin replacement.   2. Asthma-COPD overlap syndrome - We deferred lung testing today since you had a low grade fever and were not feeling well.  - We did give you a nebulizer treatment.  - Daily controller medication(s): Dupixent 300mg  every two weeks and Trelegy 100/62.5/25 one puff once daily - Prior to physical activity: ProAir 2 puffs 10-15 minutes before physical activity. - Rescue medications: ProAir 4 puffs every 4-6 hours as needed or albuterol nebulizer one vial every 4-6 hours as needed - Asthma control goals:  * Full participation in all desired activities (may need albuterol before activity) * Albuterol use two time  or less a week on average (not counting use with activity) * Cough interfering with sleep two time or less a month * Oral steroids no more than once a year * No hospitalizations  3. Chronic allergic rhinitis (sweet vernal grass, box elder, cat, weeds, ragweed, molds, cockroach, dust mite) - Continue with Dymista 2 sprays per nostril 1-2 times daily. -  Use Afrin 1-2 sprays prior to the Dymista.  - Continue with saline mist 1-2 times daily.  - Use cetirizine 10mg  daily as needed for breakthrough symptoms. - Continue with Karbinal 92mL twice daily as needed for runny nose.  - Resume allergy shots once you have gotten more stabilized from a respiratory perspective.     4. Sleep apnea - I would definitely resume your CPAP as tolerated. - We will refer you to back to Mark Harrell for evaluation for possible surgery.  - This is likely resulting in your sleepiness.   5. GERD - We will send in a prescription for ranitidine 150mg  daily.   6. Return in about 3 months (around 02/09/2018).  Subjective:   Mark Harrell is a 57 y.o. male presenting today for follow up of  Chief Complaint  Patient presents with  . Cough  . Nasal Congestion  . Asthma    Mark Harrell has a history of the following: Patient Active Problem List   Diagnosis Date Noted  . Hymenoptera allergy 11/10/2017  . Seasonal and perennial allergic rhinitis 04/23/2017  . 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 Cervicalgia 04/01/2017  . Headache 11/13/2016  . Migraine without aura and without status migrainosus, not intractable 10/30/2016  . Irritant contact dermatitis due to frequent handwashing 06/08/2016  . Lumbar radiculopathy 05/15/2016  . Memory change 05/15/2016  . Osteoarthritis of spine with radiculopathy, lumbar region 05/15/2016  . Risk for falls 05/15/2016  . Acute medial meniscal tear, left, subsequent encounter 04/23/2016  . Recurrent infections 03/29/2016  . Chronic nonseasonal allergic rhinitis due to pollen 03/29/2016  . Polypharmacy 01/16/2016  . Morbid obesity with BMI of 50.0-59.9, adult ([place]) 03/06/2015  . SDAT 02/05/2015  . Asthma 02/05/2015  . OSA and COPD overlap syndrome ([place]) 02/05/2015  . Medication management 08/02/2014  . GERD (gastroesophageal reflux disease) 05/09/2014  . Vitamin D deficiency 08/01/2013  . Prediabetes 08/01/2013  . Positive  TB test 07/29/2011  . Diverticula of colon 05/07/2011  . Hypertension 01/31/2011  . Hyperlipidemia, mixed 01/31/2011  . BPH (benign prostatic hyperplasia) 01/31/2011  . Testosterone Deficiency 01/31/2011  . IBS (irritable bowel syndrome) 01/31/2011  . Partial complex seizure disorder with intractable epilepsy ([place]) 01/31/2011  . Depression, major, recurrent, in partial remission ([place]) 01/31/2011  . Asthma-COPD overlap syndrome ([place]) 01/31/2011    History obtained from: chart review and patient.  Mark Harrell's Primary Care Provider is Mark Pinto, MD.     Mark Harrell is a 57 y.o. male presenting for a follow up visit.  He was last seen in Harrell 2019.  At that time, he continued to have problems with infections.  In fact, we placed him on Augmentin after having failed 2 different antibiotics for sinusitis.  We have attempted to approve him for subcutaneous immunoglobulin replacement, but insurance has refused coverage.  His breathing looked fairly good last time.  In order to simplify his regimen, we stopped his Symbicort and Spiriva and started Trelegy instead.  We also started his Dupixent injections to replace his Nucala.  For his allergic rhinitis, we continue Dymista 2 sprays per nostril 1-2 times  daily.  We added on nasal Atrovent for rhinorrhea.  We continued him on cetirizine 10 mg daily as needed.  We also provided a sample of carbinol to help with postnasal drip.  He has been on allergy shots in the past, but these have been on hold due to his overall health status.  Since last visit, he reports that things have been so-so.  He was sick in January and February and early Harrell. He went to see his primary care provider a couple of times.  He was also sick 2 weekends ago and has had a slow recovery from that.  He was treated with Bactrim for UTI in the interim, otherwise he is required no antibiotics.  He is currently enrolled in cardiopulmonary rehab.  This is a 17-week 2 times per week  course including physical activity.  It runs for 3-1/2 hours on each of the 2 afternoons per week.  He is quite drained after this.  He did have to miss both classes last week due to his illness.  He remains interested in a bariatric program, but he cannot be enrolled into intensive programs at the same time.  He is still hesitant about bariatric surgery, but he thinks that he will end up having it.  From an asthma perspective, he has done fairly well.  He has had to pick sent does not feel that he has improved at all.  However, it should be noted that he has required no steroids in approximately 3 months.  This is fairly good for him.  He remains on the Trelegy 1 puff once daily.  He does use his nebulizer 3-4 times per week.  He has not seen his new pulmonologist yet.  He did have an appointment scheduled with Dr. [name], but he missed this appointment.    Of note, he does have a history of obstructive sleep apnea but he has not been using his CPAP machine.  H he feels that it dries out his nose and his mouth.  He does use the humidifier e is rather tired during the visit today, but does wake up fully during the visit. He is not using his CPAP machine because he feels that it dries out his nose and his mouth.  He does use the humidifier on the CPAP machine, but it does not do a good enough job.  He endorses market nasal congestion.  He does remain on the [place] but has not tried any Afrin.  He does have a history of ENT issues and had uvula surgery and turbinate reduction by Mark Harrell around 20 years ago.  This seems to have helped, but over time his symptoms have slowly rebounded.  He is interested in another referral to Mark Harrell today.  Allergic rhinitis remains somewhat well controlled with the Dymista, but he has not restarted his allergy shots.  He does want a prescription for the Karbinal since this seemed to help his postnasal drip.  He is on ranitidine 150 mg daily for his reflux.  He stopped  taking his Nexium because he did some testing through [facility] that showed that he cannot metabolize it appropriately.  Evidently, this testing was part of DNA ancestry testing.  Migraines are somewhat controlled on Egality, but it was not enough to prevent the migraines. He also starting on botox injections with the Egality which seems to work better.   Otherwise, there have been no changes to his past medical history, surgical history, family history, or  social history.    Review of Systems: a 14-point review of systems is pertinent for what is mentioned in HPI.  Otherwise, all other systems were negative. Constitutional: negative other than that listed in the HPI Eyes: negative other than that listed in the HPI Ears, nose, mouth, throat, and face: negative other than that listed in the HPI Respiratory: negative other than that listed in the HPI Cardiovascular: negative other than that listed in the HPI Gastrointestinal: negative other than that listed in the HPI Genitourinary: negative other than that listed in the HPI Integument: negative other than that listed in the HPI Hematologic: negative other than that listed in the HPI Musculoskeletal: negative other than that listed in the HPI Neurological: negative other than that listed in the HPI Allergy/Immunologic: negative other than that listed in the HPI    Objective:   Blood pressure 140/88, pulse 80, temperature 99.2 F (37.3 C), temperature source Oral, resp. rate 18, SpO2 94 %. There is no height or weight on file to calculate BMI.   Physical Exam:  General: Alert, interactive, in no acute distress. Obese male. Somewhat fatigued.  Eyes: No conjunctival injection bilaterally, no discharge on the right, no discharge on the left and no Horner-Trantas dots present. PERRL bilaterally. EOMI without pain. No photophobia.  Ears: Right TM pearly gray with normal light reflex, Left TM pearly gray with normal light reflex, Right TM  intact without perforation and Left TM intact without perforation.  Nose/Throat: External nose within normal limits and septum midline. Turbinates edematous with clear discharge. Posterior oropharynx erythematous without cobblestoning in the posterior oropharynx. Tonsils 2+ without exudates.  Tongue without thrush. Lungs: Clear to auscultation without wheezing, rhonchi or rales. No increased work of breathing. CV: Normal S1/S2. No murmurs. Capillary refill <2 seconds.  Skin: Warm and dry, without lesions or rashes. Neuro:   Grossly intact. No focal deficits appreciated. Responsive to questions.  Diagnostic studies: none      [name], MD  [facility]

## 2017-11-10 ENCOUNTER — Encounter (HOSPITAL_COMMUNITY): Payer: BLUE CROSS/BLUE SHIELD

## 2017-11-10 ENCOUNTER — Encounter: Payer: Self-pay | Admitting: Allergy & Immunology

## 2017-11-10 DIAGNOSIS — Z91038 Other insect allergy status: Secondary | ICD-10-CM | POA: Insufficient documentation

## 2017-11-10 DIAGNOSIS — Z91030 Bee allergy status: Secondary | ICD-10-CM | POA: Insufficient documentation

## 2017-11-10 MED ORDER — CARBINOXAMINE MALEATE ER 4 MG/5ML PO SUER: 10.0000 mg | Freq: Two times a day (BID) | ORAL | 5 refills | Status: DC

## 2017-11-11 ENCOUNTER — Telehealth: Payer: Self-pay

## 2017-11-11 NOTE — Telephone Encounter (Signed)
lmtcb x1 for pt. 

## 2017-11-11 NOTE — Telephone Encounter (Signed)
-----   Message from Marshell Garfinkel, MD sent at 11/11/2017  8:45 AM EDT ----- Thanks for the update.   Kora Groom- Can you call and reschedule appointment  ----- Message ----- From: [name], MD Sent: 11/10/2017   8:50 AM To: Marshell Garfinkel, MD  Hi there! This patient was followed by Dr. Ashok Cordia before he left. Apparently he is being transferred to your service, but missed his appointment with you. Just an FYI!

## 2017-11-12 ENCOUNTER — Telehealth: Payer: Self-pay

## 2017-11-12 ENCOUNTER — Encounter (HOSPITAL_COMMUNITY): Payer: BLUE CROSS/BLUE SHIELD

## 2017-11-12 ENCOUNTER — Ambulatory Visit: Payer: BLUE CROSS/BLUE SHIELD | Admitting: Podiatry

## 2017-11-12 NOTE — Telephone Encounter (Signed)
-----   Message from [name], MD sent at 11/09/2017  4:51 PM EDT ----- ENT referral placed for Dr. Lucia Gaskins.

## 2017-11-12 NOTE — Telephone Encounter (Signed)
Referral has been faxed to Dr Jennell Corner office.

## 2017-11-13 DIAGNOSIS — J343 Hypertrophy of nasal turbinates: Secondary | ICD-10-CM | POA: Diagnosis not present

## 2017-11-13 DIAGNOSIS — G4733 Obstructive sleep apnea (adult) (pediatric): Secondary | ICD-10-CM | POA: Diagnosis not present

## 2017-11-13 DIAGNOSIS — J300 Vasomotor rhinitis: Secondary | ICD-10-CM | POA: Diagnosis not present

## 2017-11-13 NOTE — Telephone Encounter (Signed)
Noted.  Will close encounter.  

## 2017-11-13 NOTE — Telephone Encounter (Signed)
Thank you, Dee!   Brantleigh Mifflin, MD [facility]  

## 2017-11-13 NOTE — Telephone Encounter (Signed)
Pt returned call. Pt sched 50min ROV w/PFT for 8/5 w/PM.

## 2017-11-13 NOTE — Telephone Encounter (Signed)
lmtcb x2 for pt. 

## 2017-11-16 ENCOUNTER — Encounter: Payer: Self-pay | Admitting: Internal Medicine

## 2017-11-16 ENCOUNTER — Ambulatory Visit (INDEPENDENT_AMBULATORY_CARE_PROVIDER_SITE_OTHER): Payer: BLUE CROSS/BLUE SHIELD | Admitting: Internal Medicine

## 2017-11-16 VITALS — BP 138/78 | HR 72 | Temp 97.7°F | Resp 20

## 2017-11-16 DIAGNOSIS — Z6841 Body Mass Index (BMI) 40.0 and over, adult: Secondary | ICD-10-CM | POA: Diagnosis not present

## 2017-11-16 DIAGNOSIS — E119 Type 2 diabetes mellitus without complications: Secondary | ICD-10-CM

## 2017-11-16 DIAGNOSIS — M545 Low back pain: Secondary | ICD-10-CM

## 2017-11-16 DIAGNOSIS — I872 Venous insufficiency (chronic) (peripheral): Secondary | ICD-10-CM

## 2017-11-16 DIAGNOSIS — R1032 Left lower quadrant pain: Secondary | ICD-10-CM

## 2017-11-16 DIAGNOSIS — N23 Unspecified renal colic: Secondary | ICD-10-CM | POA: Diagnosis not present

## 2017-11-16 DIAGNOSIS — Z79899 Other long term (current) drug therapy: Secondary | ICD-10-CM

## 2017-11-16 MED ORDER — PREDNISONE 20 MG PO TABS: ORAL_TABLET | ORAL | 0 refills | Status: DC

## 2017-11-16 MED ORDER — FUROSEMIDE 40 MG PO TABS: ORAL_TABLET | ORAL | 0 refills | Status: DC
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
## 2017-11-17 ENCOUNTER — Encounter (HOSPITAL_COMMUNITY): Payer: BLUE CROSS/BLUE SHIELD

## 2017-11-17 ENCOUNTER — Telehealth (HOSPITAL_COMMUNITY): Payer: Self-pay

## 2017-11-17 LAB — COMPLETE METABOLIC PANEL WITH GFR
AG RATIO: 1.5 (calc) (ref 1.0–2.5)
ALT: 18 U/L (ref 9–46)
AST: 18 U/L (ref 10–35)
Albumin: 3.7 g/dL (ref 3.6–5.1)
Alkaline phosphatase (APISO): 57 U/L (ref 40–115)
BILIRUBIN TOTAL: 1.8 mg/dL — AB (ref 0.2–1.2)
BUN: 19 mg/dL (ref 7–25)
CALCIUM: 9 mg/dL (ref 8.6–10.3)
CHLORIDE: 100 mmol/L (ref 98–110)
CO2: 33 mmol/L — ABNORMAL HIGH (ref 20–32)
Creat: 0.9 mg/dL (ref 0.70–1.33)
GFR, Est African American: 110 mL/min/{1.73_m2} (ref >=60)
GFR, Est Non African American: 95 mL/min/{1.73_m2} (ref >=60)
GLUCOSE: 94 mg/dL (ref 65–99)
Globulin: 2.4 g/dL (calc) (ref 1.9–3.7)
POTASSIUM: 4.3 mmol/L (ref 3.5–5.3)
Sodium: 140 mmol/L (ref 135–146)
Total Protein: 6.1 g/dL (ref 6.1–8.1)

## 2017-11-17 LAB — CBC WITH DIFFERENTIAL/PLATELET
BASOS PCT: 0.9 %
Basophils Absolute: 109 cells/uL (ref 0–200)
Eosinophils Absolute: 303 cells/uL (ref 15–500)
Eosinophils Relative: 2.5 %
HEMATOCRIT: 45.9 % (ref 38.5–50.0)
Hemoglobin: 15.3 g/dL (ref 13.2–17.1)
LYMPHS ABS: 3400 {cells}/uL (ref 850–3900)
MCH: 28.4 pg (ref 27.0–33.0)
MCHC: 33.3 g/dL (ref 32.0–36.0)
MCV: 85.3 fL (ref 80.0–100.0)
MPV: 11.4 fL (ref 7.5–12.5)
Monocytes Relative: 11.8 %
NEUTROS PCT: 56.7 %
Neutro Abs: 6861 cells/uL (ref 1500–7800)
Platelets: 228 10*3/uL (ref 140–400)
RBC: 5.38 10*6/uL (ref 4.20–5.80)
RDW: 13.8 % (ref 11.0–15.0)
Total Lymphocyte: 28.1 %
WBC: 12.1 10*3/uL — AB (ref 3.8–10.8)
WBCMIX: 1428 {cells}/uL — AB (ref 200–950)

## 2017-11-17 LAB — URINALYSIS, ROUTINE W REFLEX MICROSCOPIC
Bilirubin Urine: NEGATIVE
Glucose, UA: NEGATIVE
HGB URINE DIPSTICK: NEGATIVE
Ketones, ur: NEGATIVE
Leukocytes, UA: NEGATIVE
NITRITE: NEGATIVE
PROTEIN: NEGATIVE
Specific Gravity, Urine: 1.017 (ref 1.001–1.03)
pH: 5.5 (ref 5.0–8.0)

## 2017-11-17 NOTE — Telephone Encounter (Signed)
Called patient to discuss absence from rehab. No answer from patient-unable to leave message.

## 2017-11-18 DIAGNOSIS — M5416 Radiculopathy, lumbar region: Secondary | ICD-10-CM | POA: Diagnosis not present

## 2017-11-18 DIAGNOSIS — M4722 Other spondylosis with radiculopathy, cervical region: Secondary | ICD-10-CM | POA: Diagnosis not present

## 2017-11-18 DIAGNOSIS — G4733 Obstructive sleep apnea (adult) (pediatric): Secondary | ICD-10-CM | POA: Diagnosis not present

## 2017-11-19 ENCOUNTER — Encounter (HOSPITAL_COMMUNITY): Payer: BLUE CROSS/BLUE SHIELD

## 2017-11-24 ENCOUNTER — Encounter (HOSPITAL_COMMUNITY)
Admission: RE | Admit: 2017-11-24 | Discharge: 2017-11-24 | Disposition: A | Discharge status: Home / Self Care | Payer: BLUE CROSS/BLUE SHIELD | Source: Ambulatory Visit | Attending: Pulmonary Disease | Admitting: Pulmonary Disease

## 2017-11-24 VITALS — Wt >= 6400 oz

## 2017-11-24 DIAGNOSIS — J45909 Unspecified asthma, uncomplicated: Secondary | ICD-10-CM

## 2017-11-24 DIAGNOSIS — J45998 Other asthma: Secondary | ICD-10-CM | POA: Diagnosis not present

## 2017-11-24 NOTE — Progress Notes (Signed)
Pulmonary Individual Treatment Plan  Patient Details  Name: Mark Harrell MRN: [id number] Date of Birth: [birth year] Referring Provider:     Pulmonary Rehab Walk Test from 10/20/2017 in [facility]  Referring Provider  Dr. [name]      Initial Encounter Date:    Pulmonary Rehab Walk Test from 10/20/2017 in [facility]  Date  10/20/17      Visit Diagnosis: Severe asthma, unspecified whether complicated, unspecified whether persistent  Patient's Home Medications on Admission:   Current Outpatient Medications:  .  albuterol (PROVENTIL HFA;VENTOLIN HFA) 108 (90 Base) MCG/ACT inhaler, 1 to 2 inhalations 10 to 15 minutes apart every 4 hrs to rescue Asthma, Disp: 60 g, Rfl: 1 .  albuterol (PROVENTIL) (2.5 MG/3ML) 0.083% nebulizer solution, Inhale 2.5 mg into the lungs every 4 (four) hours as needed., Disp: , Rfl: 12 .  alfuzosin (UROXATRAL) 10 MG 24 hr tablet, Take 10 mg by mouth daily.  , Disp: , Rfl:  .  baclofen (LIORESAL) 10 MG tablet, Take 10 mg by mouth 3 (three) times daily as needed for muscle spasms. , Disp: , Rfl:  .  benzonatate (TESSALON) 200 MG capsule, TAKE ONE CAPSULE 3 TIMES A DAY AS NEEDED FOR COUGH, Disp: 90 capsule, Rfl: 0 .  bisoprolol-hydrochlorothiazide (ZIAC) 5-6.25 MG tablet, Take 1 tablet by mouth daily., Disp: 90 tablet, Rfl: 1 .  Calcium Carbonate-Vitamin D (CALCIUM + D PO), Take 500 mg by mouth 2 (two) times daily. , Disp: , Rfl:  .  Carbinoxamine Maleate ER [facility]) 4 MG/5ML SUER, Take 10 mg by mouth 2 (two) times daily., Disp: 480 mL, Rfl: 5 .  cetaphil (CETAPHIL) lotion, Apply topically 2 (two) times daily., Disp: 236 mL, Rfl: 0 .  cetirizine (ZYRTEC) 10 MG tablet, Take 10 mg by mouth daily., Disp: , Rfl:  .  chlorpheniramine-HYDROcodone (TUSSIONEX) 10-8 MG/5ML SUER, Take 5 mLs every 12 (twelve) hours by mouth., Disp: 140 mL, Rfl: 0 .  Cholecalciferol (VITAMIN D PO), Take 10,000-20,000 Units by mouth  2 (two) times daily. 20000 in the morning and 10000 in the evening, Disp: , Rfl:  .  clotrimazole-betamethasone (LOTRISONE) cream, APPLY TO AFFECTED AREA TWICE A DAY, Disp: 15 g, Rfl: 2 .  co-enzyme Q-10 30 MG capsule, Take 30 mg by mouth daily., Disp: , Rfl:  .  cyclobenzaprine (FLEXERIL) 10 MG tablet, Take 10 mg by mouth 3 (three) times daily. , Disp: , Rfl:  .  desmopressin (DDAVP) 0.2 MG tablet, Take 200 mcg by mouth daily. , Disp: , Rfl: 3 .  diclofenac (VOLTAREN) 75 MG EC tablet, Take 1 tablet (75 mg total) by mouth 2 (two) times daily., Disp: 180 tablet, Rfl: 1 .  diclofenac sodium (VOLTAREN) 1 % GEL, APPLY 4 GRAMS TOPICALLY 4 TIMES DAILY, Disp: 100 g, Rfl: 3 .  donepezil (ARICEPT) 23 MG TABS tablet, Take 23 mg by mouth at bedtime., Disp: , Rfl:  .  doxycycline (VIBRAMYCIN) 100 MG capsule, Take 1 capsule (100 mg total) by mouth 2 (two) times daily., Disp: 60 capsule, Rfl: 4 .  DYMISTA 137-50 MCG/ACT SUSP, PLACE 2 SPRAYS INTO BOTH NOSTRILS 2 (TWO) TIMES DAILY., Disp: , Rfl: 5 .  DYMISTA 137-50 MCG/ACT SUSP, PLACE 2 SPRAYS INTO BOTH NOSTRILS 2 (TWO) TIMES DAILY., Disp: 23 g, Rfl: 3 .  Eluxadoline (VIBERZI) 100 MG TABS, Take 1 tablet by mouth 2 (two) times daily. , Disp: , Rfl:  .  esomeprazole (Paxton) 40  MG capsule, TAKE 1 CAPSULE (40 MG TOTAL) BY MOUTH 2 (TWO) TIMES DAILY BEFORE A MEAL., Disp: 180 capsule, Rfl: 1 .  Fluticasone-Umeclidin-Vilant (TRELEGY ELLIPTA) 100-62.5-25 MCG/INH AEPB, Inhale 1 puff into the lungs daily., Disp: 28 each, Rfl: 5 .  Fluticasone-Umeclidin-Vilant (TRELEGY ELLIPTA) 100-62.5-25 MCG/INH AEPB, Inhale 1 puff into the lungs daily., Disp: 28 each, Rfl: 2 .  furosemide (LASIX) 40 MG tablet, Take 1 tablet 2 x/day for Fluid Retention & Swelling of legs, Disp: 60 tablet, Rfl: 0 .  Galcanezumab-gnlm (EMGALITY) 120 MG/ML SOAJ, Inject into the skin. Pt receives this monthly ingection, Disp: , Rfl:  .  guaiFENesin (MUCINEX) 600 MG 12 hr tablet, Take 600 mg by mouth 2 (two)  times daily as needed for cough or to loosen phlegm. Instructed to take 3 tablets in the morning, Disp: , Rfl:  .  HYDROcodone-homatropine (HYCODAN) 5-1.5 MG/5ML syrup, Take 5 mLs by mouth every 6 (six) hours as needed for cough. Do not take with other opioids such as tramadol., Disp: 120 mL, Rfl: 0 .  LUTEIN PO, Take 1 tablet by mouth 2 (two) times daily., Disp: , Rfl:  .  LYRICA 150 MG capsule, Take 300 mg by mouth 2 (two) times daily., Disp: , Rfl: 1 .  memantine (NAMENDA) 10 MG tablet, Take 10 mg by mouth 2 (two) times daily. , Disp: , Rfl:  .  Methylfol-Algae-B12-Acetylcyst (CEREFOLIN NAC) 6-90.314-2-600 MG TABS, Take 1 tablet by mouth daily. , Disp: , Rfl:  .  montelukast (SINGULAIR) 10 MG tablet, TAKE 1 TABLET BY MOUTH EVERY DAY, Disp: 90 tablet, Rfl: 1 .  Multiple Vitamins-Minerals (MULTIVITAMIN WITH MINERALS) tablet, Take 1 tablet by mouth daily., Disp: , Rfl:  .  ondansetron (ZOFRAN-ODT) 4 MG disintegrating tablet, TAKE 1 TABLET (4 MG TOTAL) BY MOUTH EVERY 6 (SIX) HOURS AS NEEDED FOR NAUSEA OR VOMITING., Disp: 30 tablet, Rfl: 0 .  oxyCODONE-acetaminophen (PERCOCET) 10-325 MG tablet, Take 1 tablet by mouth every 4 (four) hours as needed for pain., Disp: 60 tablet, Rfl: 0 .  oxymetazoline (AFRIN) 0.05 % nasal spray, Place 1 spray into both nostrils See admin instructions. 1 SPRAY PER SIDE EACH NIGHT BEFORE DYMISTA, Disp: , Rfl:  .  pentosan polysulfate (ELMIRON) 100 MG capsule, Take 100 mg by mouth 3 (three) times daily before meals. , Disp: , Rfl:  .  Phosphatidylserine-DHA-EPA (VAYACOG PO), Take 1 capsule by mouth 2 (two) times daily., Disp: , Rfl:  .  Potassium 99 MG TABS, Take 1 tablet by mouth daily., Disp: , Rfl:  .  pravastatin (PRAVACHOL) 40 MG tablet, TAKE 1 TABLET BY MOUTH AT BEDTIME, Disp: 90 tablet, Rfl: 1 .  predniSONE (DELTASONE) 20 MG tablet, 1 tab 3 x day for 3 days, then 1 tab 2 x day for 3 days, then 1 tab 1 x day for 5 days, Disp: 20 tablet, Rfl: 0 .  PRESCRIPTION  MEDICATION, Pt receives weekly allergy shots, Disp: , Rfl:  .  Probiotic Product (VSL#3 DS) PACK, Take 2 each by mouth daily., Disp: , Rfl:  .  ranitidine (ZANTAC) 150 MG capsule, Take 1 capsule (150 mg total) by mouth 2 (two) times daily., Disp: 30 capsule, Rfl: 5 .  ranitidine (ZANTAC) 150 MG tablet, Take 1 tablet (150 mg total) by mouth 2 (two) times daily., Disp: 190 tablet, Rfl: 1 .  sodium chloride (OCEAN) 0.65 % SOLN nasal spray, Place 1 spray into both nostrils 4 (four) times daily as needed for congestion. Uses each time before other  nasal sprays, Disp: , Rfl:  .  tadalafil (CIALIS) 5 MG tablet, Take 5 mg by mouth every evening. , Disp: , Rfl:  .  traMADol (ULTRAM) 50 MG tablet, TAKE 1 TABLET EVERY 6 HOURS AS NEEDED FOR COUGH, Disp: 60 tablet, Rfl: 0 .  triamcinolone cream (KENALOG) 0.1 %, APPLY TOPICALLY 4 TIMES A DAY (Patient taking differently: APPLY TOPICALLY 2 TIMES A DAY AS NEEDED for itching), Disp: 30 g, Rfl: 1 .  trospium (SANCTURA) 20 MG tablet, Take 20 mg by mouth 2 (two) times daily. , Disp: , Rfl: 2  Current Facility-Administered Medications:  [name]  Mepolizumab SOLR 100 mg, 100 mg, Subcutaneous, Q28 days, [name], MD, 100 mg at 05/11/17 1546  Past Medical History: Past Medical History:  Diagnosis Date  . Anesthesia complication requiring reversal agent administration    ? from central apnea, very difficult to get off vent  . Anxiety   . Arthritis    osteo  . Asthma   . BPH (benign prostatic hyperplasia)   . Complication of anesthesia    difficulty waking , they twlight me because of my respiratory problems "  . Dyspnea   . Enlarged heart   . Family history of adverse reaction to anesthesia    mother trouble waking up, and heart stopped  . GERD (gastroesophageal reflux disease)   . Headache    botox injections for headaches  . Hyperlipidemia   . Hypertension   . Hypogonadism male   . IBS (irritable bowel syndrome)   . Obesity   . OSA (obstructive  sleep apnea)    cpap  . Pneumonia   . Pre-diabetes   . Prostatitis     Tobacco Use: Social History   Tobacco Use  Smoking Status Former Smoker  . Packs/day: 0.10  . Years: 15.00  . Pack years: 1.50  Smokeless Tobacco Never Used  Tobacco Comment   significant second-hand exposure through mother    Labs: Recent Review Flowsheet Data    Labs for ITP Cardiac and Pulmonary Rehab Latest Ref Rng & Units 10/02/2016 01/08/2017 02/15/2017 04/14/2017 07/20/2017   Cholestrol <200 mg/dL 128 110 - 122 137   LDLCALC mg/dL (calc) 52 54 - 64 64   HDL >40 mg/dL 31(L) 33(L) - 26(L) 48   Trlycerides <150 mg/dL 223(H) 147 - 308(H) 169(H)   Hemoglobin A1c <5.7 % of total Hgb 5.5 - - 6.1(H) 6.6(H)   PHART 7.350 - 7.450 - - - - -   PCO2ART 32.0 - 48.0 mmHg - - - - -   HCO3 20.0 - 28.0 mmol/L - - 32.0(H) - -   TCO2 22 - 32 mmol/L - - 34(H) - -   O2SAT % - - 80.0 - -      Capillary Blood Glucose: Lab Results  Component Value Date   GLUCAP 116 (H) 02/18/2017   GLUCAP 96 02/18/2017   GLUCAP 135 (H) 02/17/2017   GLUCAP 192 (H) 02/17/2017   GLUCAP 191 (H) 02/17/2017     Pulmonary Assessment Scores: Pulmonary Assessment Scores    Row Name 10/22/17 0902 11/03/17 1156       ADL UCSD   ADL Phase  Entry  Entry    SOB Score total  -  73      CAT Score   CAT Score  -  35      mMRC Score   mMRC Score  4  -       Pulmonary Function Assessment:  Exercise Target Goals:    Exercise Program Goal: Individual exercise prescription set using results from initial 6 min walk test and THRR while considering  patient's activity barriers and safety.   Exercise Prescription Goal: Initial exercise prescription builds to 30-45 minutes a day of aerobic activity, 2-3 days per week.  Home exercise guidelines will be given to patient during program as part of exercise prescription that the participant will acknowledge.  Activity Barriers & Risk Stratification: Activity Barriers & Cardiac Risk  Stratification - 10/16/17 1458      Activity Barriers & Cardiac Risk Stratification   Activity Barriers  Back Problems;Balance Concerns;Deconditioning;Arthritis;Shortness of Breath;Assistive Device multiple of orthopedic stressors    Cardiac Risk Stratification  High       6 Minute Walk: 6 Minute Walk    Row Name 10/22/17 0852         6 Minute Walk   Phase  Initial     Distance  600 feet     Walk Time  6 minutes     # of Rest Breaks  0     MPH  1.13     METS  1.84     RPE  13     Perceived Dyspnea   1     Symptoms  Yes (comment)     Comments  7/10 BACK PAIN     Resting HR  96 bpm     Resting BP  160/88     Resting Oxygen Saturation   89 %     Exercise Oxygen Saturation  during 6 min walk  87 %     Max Ex. HR  117 bpm     Max Ex. BP  140/94       Interval HR   1 Minute HR  97     2 Minute HR  107     3 Minute HR  106     4 Minute HR  111     5 Minute HR  117     6 Minute HR  108     2 Minute Post HR  95     Interval Heart Rate?  Yes       Interval Oxygen   Interval Oxygen?  Yes     Baseline Oxygen Saturation %  89 %     1 Minute Oxygen Saturation %  89 %     1 Minute Liters of Oxygen  0 L     2 Minute Oxygen Saturation %  87 %     2 Minute Liters of Oxygen  0 L     3 Minute Oxygen Saturation %  89 %     3 Minute Liters of Oxygen  2 L     4 Minute Oxygen Saturation %  91 %     4 Minute Liters of Oxygen  2 L     5 Minute Oxygen Saturation %  90 %     5 Minute Liters of Oxygen  2 L     6 Minute Oxygen Saturation %  91 %     6 Minute Liters of Oxygen  2 L     2 Minute Post Oxygen Saturation %  95 %     2 Minute Post Liters of Oxygen  2 L        Oxygen Initial Assessment: Oxygen Initial Assessment - 10/22/17 0851      Initial 6 min Walk   Oxygen Used  Continuous;E-Tanks  Liters per minute  2      Program Oxygen Prescription   Program Oxygen Prescription  Continuous;E-Tanks    Liters per minute  2       Oxygen Re-Evaluation: Oxygen Re-Evaluation     [place]ir Name 10/27/17 0715 11/20/17 1507           Program Oxygen Prescription   Program Oxygen Prescription  Continuous;E-Tanks  Continuous;E-Tanks      Liters per minute  2  2        Home Oxygen   Home Oxygen Device  E-Tanks  E-Tanks      Sleep Oxygen Prescription  None  None      Home Exercise Oxygen Prescription  Continuous  Continuous      Liters per minute  0.5  2      Home at Rest Exercise Oxygen Prescription  None  None      Compliance with Home Oxygen Use  Yes  Yes        Goals/Expected Outcomes   Short Term Goals  To learn and exhibit compliance with exercise, home and travel O2 prescription;To learn and understand importance of maintaining oxygen saturations>88%;To learn and demonstrate proper use of respiratory medications;To learn and understand importance of monitoring SPO2 with pulse oximeter and demonstrate accurate use of the pulse oximeter.;To learn and demonstrate proper pursed lip breathing techniques or other breathing techniques.  To learn and exhibit compliance with exercise, home and travel O2 prescription;To learn and understand importance of maintaining oxygen saturations>88%;To learn and demonstrate proper use of respiratory medications;To learn and understand importance of monitoring SPO2 with pulse oximeter and demonstrate accurate use of the pulse oximeter.;To learn and demonstrate proper pursed lip breathing techniques or other breathing techniques.      Long  Term Goals  Exhibits compliance with exercise, home and travel O2 prescription;Verbalizes importance of monitoring SPO2 with pulse oximeter and return demonstration;Maintenance of O2 saturations>88%;Exhibits proper breathing techniques, such as pursed lip breathing or other method taught during program session;Compliance with respiratory medication;Demonstrates proper use of MDI's  Exhibits compliance with exercise, home and travel O2 prescription;Verbalizes importance of monitoring SPO2 with pulse oximeter  and return demonstration;Maintenance of O2 saturations>88%;Exhibits proper breathing techniques, such as pursed lip breathing or other method taught during program session;Compliance with respiratory medication;Demonstrates proper use of MDI's      Goals/Expected Outcomes  compliance  compliance         Oxygen Discharge (Final Oxygen Re-Evaluation): Oxygen Re-Evaluation - 11/20/17 1507      Program Oxygen Prescription   Program Oxygen Prescription  Continuous;E-Tanks    Liters per minute  2      Home Oxygen   Home Oxygen Device  E-Tanks    Sleep Oxygen Prescription  None    Home Exercise Oxygen Prescription  Continuous    Liters per minute  2    Home at Rest Exercise Oxygen Prescription  None    Compliance with Home Oxygen Use  Yes      Goals/Expected Outcomes   Short Term Goals  To learn and exhibit compliance with exercise, home and travel O2 prescription;To learn and understand importance of maintaining oxygen saturations>88%;To learn and demonstrate proper use of respiratory medications;To learn and understand importance of monitoring SPO2 with pulse oximeter and demonstrate accurate use of the pulse oximeter.;To learn and demonstrate proper pursed lip breathing techniques or other breathing techniques.    Long  Term Goals  Exhibits compliance with exercise, home and travel O2 prescription;Verbalizes importance of  monitoring SPO2 with pulse oximeter and return demonstration;Maintenance of O2 saturations>88%;Exhibits proper breathing techniques, such as pursed lip breathing or other method taught during program session;Compliance with respiratory medication;Demonstrates proper use of MDI's    Goals/Expected Outcomes  compliance       Initial Exercise Prescription: Initial Exercise Prescription - 10/22/17 0800      Date of Initial Exercise RX and Referring Provider   Date  10/20/17    Referring Provider  Dr. [name]      Oxygen   Oxygen  Continuous    Liters  2      NuStep    Level  1    SPM  80    Minutes  34    METs  1.5      Track   Laps  5    Minutes  17      Prescription Details   Frequency (times per week)  2    Duration  Progress to 45 minutes of aerobic exercise without signs/symptoms of physical distress      Intensity   THRR 40-80% of Max Heartrate  66-132    Ratings of Perceived Exertion  11-13    Perceived Dyspnea  0-4      Progression   Progression  Continue progressive overload as per policy without signs/symptoms or physical distress.      Resistance Training   Training Prescription  Yes    Weight  blue bands    Reps  10-15       Perform Capillary Blood Glucose checks as needed.  Exercise Prescription Changes: Exercise Prescription Changes    Row Name 10/29/17 1448             Response to Exercise   Blood Pressure (Admit)  140/60       Blood Pressure (Exercise)  122/60       Blood Pressure (Exit)  112/70       Heart Rate (Admit)  90 bpm       Heart Rate (Exercise)  102 bpm       Heart Rate (Exit)  89 bpm       Oxygen Saturation (Admit)  88 %       Oxygen Saturation (Exercise)  91 %       Oxygen Saturation (Exit)  93 %       Rating of Perceived Exertion (Exercise)  19       Perceived Dyspnea (Exercise)  3       Duration  Progress to 45 minutes of aerobic exercise without signs/symptoms of physical distress       Intensity  - 40-80% HRR         Resistance Training   Training Prescription  Yes       Weight  blue bands       Reps  10-15       Time  10 Minutes         Interval Training   Interval Training  No         Oxygen   Oxygen  Continuous       Liters  2         NuStep   Level  1       SPM  80       Minutes  17       METs  1.9         Track   Laps  7       Minutes  17          Exercise Comments:   Exercise Goals and Review: Exercise Goals    Row Name 10/16/17 1459             Exercise Goals   Increase Physical Activity  Yes       Intervention  Provide advice, education, support and  counseling about physical activity/exercise needs.;Develop an individualized exercise prescription for aerobic and resistive training based on initial evaluation findings, risk stratification, comorbidities and participant's personal goals.       Expected Outcomes  Short Term: Attend rehab on a regular basis to increase amount of physical activity.;Long Term: Add in home exercise to make exercise part of routine and to increase amount of physical activity.;Long Term: Exercising regularly at least 3-5 days a week.       Increase Strength and Stamina  Yes       Intervention  Provide advice, education, support and counseling about physical activity/exercise needs.;Develop an individualized exercise prescription for aerobic and resistive training based on initial evaluation findings, risk stratification, comorbidities and participant's personal goals.       Expected Outcomes  Short Term: Increase workloads from initial exercise prescription for resistance, speed, and METs.;Short Term: Perform resistance training exercises routinely during rehab and add in resistance training at home;Long Term: Improve cardiorespiratory fitness, muscular endurance and strength as measured by increased METs and functional capacity (6MWT)       Able to understand and use rate of perceived exertion (RPE) scale  Yes       Intervention  Provide education and explanation on how to use RPE scale       Expected Outcomes  Short Term: Able to use RPE daily in rehab to express subjective intensity level;Long Term:  Able to use RPE to guide intensity level when exercising independently       Able to understand and use Dyspnea scale  Yes       Intervention  Provide education and explanation on how to use Dyspnea scale       Expected Outcomes  Short Term: Able to use Dyspnea scale daily in rehab to express subjective sense of shortness of breath during exertion;Long Term: Able to use Dyspnea scale to guide intensity level when exercising  independently       Intervention  Provide education and explanation of THRR including how the numbers were predicted and where they are located for reference       Expected Outcomes  Long Term: Able to use THRR to govern intensity when exercising independently;Short Term: Able to state/look up THRR;Short Term: Able to use daily as guideline for intensity in rehab       Understanding of Exercise Prescription  Yes       Intervention  Provide education, explanation, and written materials on patient's individual exercise prescription       Expected Outcomes  Short Term: Able to explain program exercise prescription;Long Term: Able to explain home exercise prescription to exercise independently          Exercise Goals Re-Evaluation : Exercise Goals Re-Evaluation    Row Name 10/27/17 0716 11/20/17 1508           Exercise Goal Re-Evaluation   Exercise Goals Review  Increase Strength and Stamina;Able to understand and use Dyspnea scale;Increase Physical Activity;Able to understand and use rate of perceived exertion (RPE) scale;Knowledge and understanding of Target Heart Rate Range (THRR);Understanding of Exercise Prescription  Increase Strength and Stamina;Able to understand and  use Dyspnea scale;Increase Physical Activity;Able to understand and use rate of perceived exertion (RPE) scale;Knowledge and understanding of Target Heart Rate Range (THRR);Understanding of Exercise Prescription      Comments  The patient is expected to start his first day of rehab today (10/27/17). Will cont to monitor and progress as able.   Patient has only attended two rehab sessions. Has not been to exercise since 10/29/17. Patient has been out due to numerous health issues. States he will be back next week. Will progress and motivate as able.      Expected Outcomes  Through exercise at rehab and at home, the patient will decrease shortness of breath with daily activities and feel confident in carrying out an exercise regime at  home.   Through exercise at rehab and at home, the patient will decrease shortness of breath with daily activities and feel confident in carrying out an exercise regime at home.          Discharge Exercise Prescription (Final Exercise Prescription Changes): Exercise Prescription Changes - 10/29/17 1448      Response to Exercise   Blood Pressure (Admit)  140/60    Blood Pressure (Exercise)  122/60    Blood Pressure (Exit)  112/70    Heart Rate (Admit)  90 bpm    Heart Rate (Exercise)  102 bpm    Heart Rate (Exit)  89 bpm    Oxygen Saturation (Admit)  88 %    Oxygen Saturation (Exercise)  91 %    Oxygen Saturation (Exit)  93 %    Rating of Perceived Exertion (Exercise)  19    Perceived Dyspnea (Exercise)  3    Duration  Progress to 45 minutes of aerobic exercise without signs/symptoms of physical distress    Intensity  -- 40-80% HRR      Resistance Training   Training Prescription  Yes    Weight  blue bands    Reps  10-15    Time  10 Minutes      Interval Training   Interval Training  No      Oxygen   Oxygen  Continuous    Liters  2      NuStep   Level  1    SPM  80    Minutes  17    METs  1.9      Track   Laps  7    Minutes  17       Nutrition:  Target Goals: Understanding of nutrition guidelines, daily intake of sodium '1500mg'$ , cholesterol '200mg'$ , calories 30% from fat and 7% or less from saturated fats, daily to have 5 or more servings of fruits and vegetables.  Biometrics:    Nutrition Therapy Plan and Nutrition Goals:   Nutrition Assessments: Nutrition Assessments - 11/11/17 1836      Rate Your Plate Scores   Pre Score  52       Nutrition Goals Re-Evaluation:   Nutrition Goals Discharge (Final Nutrition Goals Re-Evaluation):   Psychosocial: Target Goals: Acknowledge presence or absence of significant depression and/or stress, maximize coping skills, provide positive support system. Participant is able to verbalize types and ability to use  techniques and skills needed for reducing stress and depression.  Initial Review & Psychosocial Screening: Initial Psych Review & Screening - 10/16/17 1450      Initial Review   Current issues with  Current Stress Concerns;Current Depression;Current Anxiety/Panic;Current Sleep Concerns    Source of Stress Concerns  Chronic Illness;Family;Unable to  participate in former interests or hobbies;Unable to perform yard/household activities    Comments  Pt living with his uncle. Pt feels he can not live alone due to his health issues.  Goal of year to return to him.      Barriers   Psychosocial barriers to participate in program  The patient should benefit from training in stress management and relaxation.      Screening Interventions   Interventions  Encouraged to exercise    Expected Outcomes  Long Term Goal: Stressors or current issues are controlled or eliminated.;Short Term goal: Identification and review with participant of any Quality of Life or Depression concerns found by scoring the questionnaire.       Quality of Life Scores:  Scores of 19 and below usually indicate a poorer quality of life in these areas.  A difference of  2-3 points is a clinically meaningful difference.  A difference of 2-3 points in the total score of the Quality of Life Index has been associated with significant improvement in overall quality of life, self-image, physical symptoms, and general health in studies assessing change in quality of life.   PHQ-9: Recent Review Flowsheet Data    Depression screen [facility] 2/9 10/16/2017 04/14/2017 10/04/2016 04/23/2016 08/13/2015   Decreased Interest 0 0 0 0 0   Down, Depressed, Hopeless 2 0 0 0 0   PHQ - 2 Score 2 0 0 0 0   Altered sleeping 3 - - - -   Tired, decreased energy 3 - - - -   Change in appetite 2 - - - -   Feeling bad or failure about yourself  1 - - - -   Trouble concentrating 2 - - - -   Moving slowly or fidgety/restless 1 - - - -   Suicidal thoughts 0 - -  - -   PHQ-9 Score 14 - - - -   Difficult doing work/chores Somewhat difficult - - - -     Interpretation of Total Score  Total Score Depression Severity:  1-4 = Minimal depression, 5-9 = Mild depression, 10-14 = Moderate depression, 15-19 = Moderately severe depression, 20-27 = Severe depression   Psychosocial Evaluation and Intervention: Psychosocial Evaluation - 10/27/17 1252      Psychosocial Evaluation & Interventions   Interventions  Stress management education;Relaxation education;Encouraged to exercise with the program and follow exercise prescription    Comments  Pt to start exercise today.  Will continue to monitor.    Expected Outcomes  Pt will demonstrate positive and healthy coping skills with positive outlook on his life and future.    Continue Psychosocial Services   Follow up required by staff       Psychosocial Re-Evaluation: Psychosocial Re-Evaluation    [place] Name 11/24/17 1054             Psychosocial Re-Evaluation   Current issues with  Current Depression;Current Sleep Concerns;Current Stress Concerns;Current Anxiety/Panic       Comments  not participating d/t lower back pain/sciatica       Expected Outcomes  no barriers to participation in pulmonary rehab       Interventions  Physician referral return to program when released from MD       Continue Psychosocial Services   Follow up required by staff       Comments  recovering from back issue         Initial Review   Source of Stress Concerns  Chronic Illness;Unable to participate  in former interests or hobbies;Unable to perform yard/household activities;Family          Psychosocial Discharge (Final Psychosocial Re-Evaluation): Psychosocial Re-Evaluation - 11/24/17 1054      Psychosocial Re-Evaluation   Current issues with  Current Depression;Current Sleep Concerns;Current Stress Concerns;Current Anxiety/Panic    Comments  not participating d/t lower back pain/sciatica    Expected Outcomes  no barriers  to participation in pulmonary rehab    Interventions  Physician referral return to program when released from MD    Continue Psychosocial Services   Follow up required by staff    Comments  recovering from back issue      Initial Review   Source of Stress Concerns  Chronic Illness;Unable to participate in former interests or hobbies;Unable to perform yard/household activities;Family        Education: Education Goals: Education classes will be provided on a weekly basis, covering required topics. Participant will state understanding/return demonstration of topics presented.  Learning Barriers/Preferences: Learning Barriers/Preferences - 10/16/17 1456      Learning Barriers/Preferences   Learning Barriers  Sight;Hearing;Exercise Concerns    Learning Preferences  Verbal Instruction;Written Material;Individual Instruction;Group Instruction;Computer/Internet       Education Topics: How Lungs Work and Diseases: - Discuss the anatomy of the lungs and diseases that can affect the lungs, such as COPD.   Exercise: -Discuss the importance of exercise, FITT principles of exercise, normal and abnormal responses to exercise, and how to exercise safely.   Environmental Irritants: -Discuss types of environmental irritants and how to limit exposure to environmental irritants.   Meds/Inhalers and oxygen: - Discuss respiratory medications, definition of an inhaler and oxygen, and the proper way to use an inhaler and oxygen.   Energy Saving Techniques: - Discuss methods to conserve energy and decrease shortness of breath when performing activities of daily living.    Bronchial Hygiene / Breathing Techniques: - Discuss breathing mechanics, pursed-lip breathing technique,  proper posture, effective ways to clear airways, and other functional breathing techniques   Cleaning Equipment: - Provides group verbal and written instruction about the health risks of elevated stress, cause of high  stress, and healthy ways to reduce stress.   Nutrition I: Fats: - Discuss the types of cholesterol, what cholesterol does to the body, and how cholesterol levels can be controlled.   Nutrition II: Labels: -Discuss the different components of food labels and how to read food labels.   Respiratory Infections: - Discuss the signs and symptoms of respiratory infections, ways to prevent respiratory infections, and the importance of seeking medical treatment when having a respiratory infection.   Stress I: Signs and Symptoms: - Discuss the causes of stress, how stress may lead to anxiety and depression, and ways to limit stress.   Stress II: Relaxation: -Discuss relaxation techniques to limit stress.   Oxygen for Home/Travel: - Discuss how to prepare for travel when on oxygen and proper ways to transport and store oxygen to ensure safety.   Knowledge Questionnaire Score: Knowledge Questionnaire Score - 11/03/17 1156      Knowledge Questionnaire Score   Pre Score  18/18       Core Components/Risk Factors/Patient Goals at Admission: Personal Goals and Risk Factors at Admission - 10/16/17 1446      Core Components/Risk Factors/Patient Goals on Admission    Weight Management  Obesity;Yes    Intervention  Weight Management: Develop a combined nutrition and exercise program designed to reach desired caloric intake, while maintaining appropriate intake of nutrient and  fiber, sodium and fats, and appropriate energy expenditure required for the weight goal.;Weight Management: Provide education and appropriate resources to help participant work on and attain dietary goals.;Weight Management/Obesity: Establish reasonable short term and long term weight goals.;Obesity: Provide education and appropriate resources to help participant work on and attain dietary goals.    Goal Weight: Long Term  200 lb (90.7 kg)    Expected Outcomes  Short Term: Continue to assess and modify interventions until  short term weight is achieved;Weight Maintenance: Understanding of the daily nutrition guidelines, which includes 25-35% calories from fat, 7% or less cal from saturated fats, less than '200mg'$  cholesterol, less than 1.5gm of sodium, & 5 or more servings of fruits and vegetables daily;Weight Loss: Understanding of general recommendations for a balanced deficit meal plan, which promotes 1-2 lb weight loss per week and includes a negative energy balance of [phone] kcal/d;Understanding recommendations for meals to include 15-35% energy as protein, 25-35% energy from fat, 35-60% energy from carbohydrates, less than '200mg'$  of dietary cholesterol, 20-35 gm of total fiber daily;Understanding of distribution of calorie intake throughout the day with the consumption of 4-5 meals/snacks    Improve shortness of breath with ADL's  Yes    Intervention  Provide education, individualized exercise plan and daily activity instruction to help decrease symptoms of SOB with activities of daily living.    Expected Outcomes  Short Term: Improve cardiorespiratory fitness to achieve a reduction of symptoms when performing ADLs;Long Term: Be able to perform more ADLs without symptoms or delay the onset of symptoms    Hypertension  Yes    Intervention  Provide education on lifestyle modifcations including regular physical activity/exercise, weight management, moderate sodium restriction and increased consumption of fresh fruit, vegetables, and low fat dairy, alcohol moderation, and smoking cessation.;Monitor prescription use compliance.    Expected Outcomes  Short Term: Continued assessment and intervention until BP is < 140/65m HG in hypertensive participants. < 130/870mHG in hypertensive participants with diabetes, heart failure or chronic kidney disease.;Long Term: Maintenance of blood pressure at goal levels.    Lipids  Yes    Intervention  Provide education and support for participant on nutrition & aerobic/resistive exercise  along with prescribed medications to achieve LDL '70mg'$ , HDL >'40mg'$ .    Expected Outcomes  Short Term: Participant states understanding of desired cholesterol values and is compliant with medications prescribed. Participant is following exercise prescription and nutrition guidelines.;Long Term: Cholesterol controlled with medications as prescribed, with individualized exercise RX and with personalized nutrition plan. Value goals: LDL < '70mg'$ , HDL > 40 mg.    Stress  Yes family stress, mother has dementia, father has bone marrow disease, sister is on drugs    Intervention  Offer individual and/or small group education and counseling on adjustment to heart disease, stress management and health-related lifestyle change. Teach and support self-help strategies.    Expected Outcomes  Short Term: Participant demonstrates changes in health-related behavior, relaxation and other stress management skills, ability to obtain effective social support, and compliance with psychotropic medications if prescribed.       Core Components/Risk Factors/Patient Goals Review:  Goals and Risk Factor Review    Row Name 10/27/17 1241 11/24/17 1051           Core Components/Risk Factors/Patient Goals Review   Personal Goals Review  Weight Management/Obesity;Develop more efficient breathing techniques such as purse lipped breathing and diaphragmatic breathing and practicing self-pacing with activity.;Improve shortness of breath with ADL's;Lipids;Hypertension;Stress  Weight Management/Obesity;Improve shortness of breath with  ADL's;Hypertension;Lipids;Stress      Review  Pt to begin exercise today.  Unable to assess at this time  has attended 2 exercise sessions, is being treated by Dr. Melford Aase for lower back pain/sciatica with prednisone, he has been absent since 10/31/17, unable to contact or leave a message      Expected Outcomes  See "admission outcomes"  See "admission outcomes"         Core Components/Risk Factors/Patient  Goals at Discharge (Final Review):  Goals and Risk Factor Review - 11/24/17 1051      Core Components/Risk Factors/Patient Goals Review   Personal Goals Review  Weight Management/Obesity;Improve shortness of breath with ADL's;Hypertension;Lipids;Stress    Review  has attended 2 exercise sessions, is being treated by Dr. Melford Aase for lower back pain/sciatica with prednisone, he has been absent since 10/31/17, unable to contact or leave a message    Expected Outcomes  See "admission outcomes"       ITP Comments: ITP Comments    Row Name 10/16/17 1417 10/28/17 1124         ITP Comments  Dr. Jennet Maduro  Dr. Baxter Kail, Medical Director         Comments: ITP REVIEW Pt is making expected progress toward pulmonary rehab goals after completing 2 sessions. Recommend continued exercise, life style modification, education, and utilization of breathing techniques to increase stamina and strength and decrease shortness of breath with exertion.

## 2017-11-24 NOTE — Progress Notes (Signed)
Daily Session Note  Patient Details  Name: Mark Harrell MRN: [id number] Date of Birth: 1961/02/19 Referring Provider:     Pulmonary Rehab Walk Test from 10/20/2017 in [place]  Referring Provider  Dr. [name]      Encounter Date: 11/24/2017  Check In: Session Check In - 11/24/17 1330      Check-In   Location  [facility]    Staff Present  Rosebud Poles, RN, BSN;Carlette Carlton, RN, BSN;Molly DiVincenzo, MS, ACSM RCEP, Exercise Physiologist;Lisa Ysidro Evert, Felipe Drone, RN, [facility]    Supervising physician immediately available to respond to emergencies  [facility] immediately available    Physician(s)  Dr. Bonner Puna    Medication changes reported      No    Fall or balance concerns reported     No    Tobacco Cessation  No Change    Warm-up and Cool-down  Performed as group-led instruction    Resistance Training Performed  Yes    VAD Patient?  No    PAD/SET Patient?  No      Pain Assessment   Currently in Pain?  No/denies    Multiple Pain Sites  No       Capillary Blood Glucose: No results found for this or any previous visit (from the past 24 hour(s)).    Social History   Tobacco Use  Smoking Status Former Smoker  . Packs/day: 0.10  . Years: 15.00  . Pack years: 1.50  Smokeless Tobacco Never Used  Tobacco Comment   significant second-hand exposure through mother    Goals Met:  Exercise tolerated well Strength training completed today  Goals Unmet:  Not Applicable  Comments: Service time is from 1330 to 1505.    Dr. Rush Farmer is Medical Director for Pulmonary Rehab at [facility].

## 2017-11-25 ENCOUNTER — Encounter: Payer: Self-pay | Admitting: Internal Medicine

## 2017-11-25 ENCOUNTER — Ambulatory Visit: Payer: BLUE CROSS/BLUE SHIELD | Admitting: Internal Medicine

## 2017-11-25 VITALS — BP 122/66 | HR 92 | Temp 97.6°F | Resp 20 | Ht 72.5 in | Wt >= 6400 oz

## 2017-11-25 DIAGNOSIS — J449 Chronic obstructive pulmonary disease, unspecified: Secondary | ICD-10-CM

## 2017-11-25 DIAGNOSIS — Z0000 Encounter for general adult medical examination without abnormal findings: Secondary | ICD-10-CM

## 2017-11-25 DIAGNOSIS — Z125 Encounter for screening for malignant neoplasm of prostate: Secondary | ICD-10-CM

## 2017-11-25 DIAGNOSIS — Z1329 Encounter for screening for other suspected endocrine disorder: Secondary | ICD-10-CM

## 2017-11-25 DIAGNOSIS — E782 Mixed hyperlipidemia: Secondary | ICD-10-CM

## 2017-11-25 DIAGNOSIS — Z87891 Personal history of nicotine dependence: Secondary | ICD-10-CM

## 2017-11-25 DIAGNOSIS — Z136 Encounter for screening for cardiovascular disorders: Secondary | ICD-10-CM | POA: Diagnosis not present

## 2017-11-25 DIAGNOSIS — Z13220 Encounter for screening for lipoid disorders: Secondary | ICD-10-CM | POA: Diagnosis not present

## 2017-11-25 DIAGNOSIS — Z1389 Encounter for screening for other disorder: Secondary | ICD-10-CM | POA: Diagnosis not present

## 2017-11-25 DIAGNOSIS — K219 Gastro-esophageal reflux disease without esophagitis: Secondary | ICD-10-CM

## 2017-11-25 DIAGNOSIS — E349 Endocrine disorder, unspecified: Secondary | ICD-10-CM

## 2017-11-25 DIAGNOSIS — Z1212 Encounter for screening for malignant neoplasm of rectum: Secondary | ICD-10-CM

## 2017-11-25 DIAGNOSIS — Z131 Encounter for screening for diabetes mellitus: Secondary | ICD-10-CM | POA: Diagnosis not present

## 2017-11-25 DIAGNOSIS — E119 Type 2 diabetes mellitus without complications: Secondary | ICD-10-CM

## 2017-11-25 DIAGNOSIS — R5383 Other fatigue: Secondary | ICD-10-CM

## 2017-11-25 DIAGNOSIS — Z1211 Encounter for screening for malignant neoplasm of colon: Secondary | ICD-10-CM

## 2017-11-25 DIAGNOSIS — G4733 Obstructive sleep apnea (adult) (pediatric): Secondary | ICD-10-CM

## 2017-11-25 DIAGNOSIS — Z8249 Family history of ischemic heart disease and other diseases of the circulatory system: Secondary | ICD-10-CM

## 2017-11-25 DIAGNOSIS — E559 Vitamin D deficiency, unspecified: Secondary | ICD-10-CM | POA: Diagnosis not present

## 2017-11-25 DIAGNOSIS — I10 Essential (primary) hypertension: Secondary | ICD-10-CM | POA: Diagnosis not present

## 2017-11-25 DIAGNOSIS — Z79899 Other long term (current) drug therapy: Secondary | ICD-10-CM

## 2017-11-25 DIAGNOSIS — Z6841 Body Mass Index (BMI) 40.0 and over, adult: Secondary | ICD-10-CM

## 2017-11-25 DIAGNOSIS — Z130 Encounter for screening for diseases of the blood and blood-forming organs and certain disorders involving the immune mechanism: Secondary | ICD-10-CM | POA: Diagnosis not present

## 2017-11-25 DIAGNOSIS — Z0001 Encounter for general adult medical examination with abnormal findings: Secondary | ICD-10-CM
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
## 2017-11-25 NOTE — Patient Instructions (Signed)
Preventive Care for Adults  A healthy lifestyle and preventive care can promote health and wellness. Preventive health guidelines for men include the following key practices:  A routine yearly physical is a good way to check with your health care provider about your health and preventative screening. It is a chance to share any concerns and updates on your health and to receive a thorough exam.  Visit your dentist for a routine exam and preventative care every 6 months. Brush your teeth twice a day and floss once a day. Good oral hygiene prevents tooth decay and gum disease.  The frequency of eye exams is based on your age, health, family medical history, use of contact lenses, and other factors. Follow your health care provider's recommendations for frequency of eye exams.  Eat a healthy diet. Foods such as vegetables, fruits, whole grains, low-fat dairy products, and lean protein foods contain the nutrients you need without too many calories. Decrease your intake of foods high in solid fats, added sugars, and salt. Eat the right amount of calories for you. Get information about a proper diet from your health care provider, if necessary.  Regular physical exercise is one of the most important things you can do for your health. Most adults should get at least 150 minutes of moderate-intensity exercise (any activity that increases your heart rate and causes you to sweat) each week. In addition, most adults need muscle-strengthening exercises on 2 or more days a week.  Maintain a healthy weight. The body mass index (BMI) is a screening tool to identify possible weight problems. It provides an estimate of body fat based on height and weight. Your health care provider can find your BMI and can help you achieve or maintain a healthy weight. For adults 20 years and older:  A BMI below 18.5 is considered underweight.  A BMI of 18.5 to 24.9 is normal.  A BMI of 25 to 29.9 is considered overweight.  A  BMI of 30 and above is considered obese.  Maintain normal blood lipids and cholesterol levels by exercising and minimizing your intake of saturated fat. Eat a balanced diet with plenty of fruit and vegetables. Blood tests for lipids and cholesterol should begin at age 20 and be repeated every 5 years. If your lipid or cholesterol levels are high, you are over 50, or you are at high risk for heart disease, you may need your cholesterol levels checked more frequently. Ongoing high lipid and cholesterol levels should be treated with medicines if diet and exercise are not working.  If you smoke, find out from your health care provider how to quit. If you do not use tobacco, do not start.  Lung cancer screening is recommended for adults aged 55-80 years who are at high risk for developing lung cancer because of a history of smoking. A yearly low-dose CT scan of the lungs is recommended for people who have at least a 30-pack-year history of smoking and are a current smoker or have quit within the past 15 years. A pack year of smoking is smoking an average of 1 pack of cigarettes a day for 1 year (for example: 1 pack a day for 30 years or 2 packs a day for 15 years). Yearly screening should continue until the smoker has stopped smoking for at least 15 years. Yearly screening should be stopped for people who develop a health problem that would prevent them from having lung cancer treatment.  If you choose to drink alcohol, do   not have more than 2 drinks per day. One drink is considered to be 12 ounces (355 mL) of beer, 5 ounces (148 mL) of wine, or 1.5 ounces (44 mL) of liquor.  Avoid use of street drugs. Do not share needles with anyone. Ask for help if you need support or instructions about stopping the use of drugs.  High blood pressure causes heart disease and increases the risk of stroke. Your blood pressure should be checked at least every 1-2 years. Ongoing high blood pressure should be treated with  medicines, if weight loss and exercise are not effective.  If you are 45-79 years old, ask your health care provider if you should take aspirin to prevent heart disease.  Diabetes screening involves taking a blood sample to check your fasting blood sugar level. This should be done once every 3 years, after age 45, if you are within normal weight and without risk factors for diabetes. Testing should be considered at a younger age or be carried out more frequently if you are overweight and have at least 1 risk factor for diabetes.  Colorectal cancer can be detected and often prevented. Most routine colorectal cancer screening begins at the age of 50 and continues through age 75. However, your health care provider may recommend screening at an earlier age if you have risk factors for colon cancer. On a yearly basis, your health care provider may provide home test kits to check for hidden blood in the stool. Use of a small camera at the end of a tube to directly examine the colon (sigmoidoscopy or colonoscopy) can detect the earliest forms of colorectal cancer. Talk to your health care provider about this at age 50, when routine screening begins. Direct exam of the colon should be repeated every 5-10 years through age 75, unless early forms of precancerous polyps or small growths are found.   Talk with your health care provider about prostate cancer screening.  Testicular cancer screening isrecommended for adult males. Screening includes self-exam, a health care provider exam, and other screening tests. Consult with your health care provider about any symptoms you have or any concerns you have about testicular cancer.  Use sunscreen. Apply sunscreen liberally and repeatedly throughout the day. You should seek shade when your shadow is shorter than you. Protect yourself by wearing long sleeves, pants, a wide-brimmed hat, and sunglasses year round, whenever you are outdoors.  Once a month, do a whole-body  skin exam, using a mirror to look at the skin on your back. Tell your health care provider about new moles, moles that have irregular borders, moles that are larger than a pencil eraser, or moles that have changed in shape or color.  Stay current with required vaccines (immunizations).  Influenza vaccine. All adults should be immunized every year.  Tetanus, diphtheria, and acellular pertussis (Td, Tdap) vaccine. An adult who has not previously received Tdap or who does not know his vaccine status should receive 1 dose of Tdap. This initial dose should be followed by tetanus and diphtheria toxoids (Td) booster doses every 10 years. Adults with an unknown or incomplete history of completing a 3-dose immunization series with Td-containing vaccines should begin or complete a primary immunization series including a Tdap dose. Adults should receive a Td booster every 10 years.  Varicella vaccine. An adult without evidence of immunity to varicella should receive 2 doses or a second dose if he has previously received 1 dose.  Human papillomavirus (HPV) vaccine. Males aged 13-21 years   who have not received the vaccine previously should receive the 3-dose series. Males aged 22-26 years may be immunized. Immunization is recommended through the age of 26 years for any male who has sex with males and did not get any or all doses earlier. Immunization is recommended for any person with an immunocompromised condition through the age of 26 years if he did not get any or all doses earlier. During the 3-dose series, the second dose should be obtained 4-8 weeks after the first dose. The third dose should be obtained 24 weeks after the first dose and 16 weeks after the second dose.  Zoster vaccine. One dose is recommended for adults aged 60 years or older unless certain conditions are present.    PREVNAR  - Pneumococcal 13-valent conjugate (PCV13) vaccine. When indicated, a person who is uncertain of his immunization  history and has no record of immunization should receive the PCV13 vaccine. An adult aged 19 years or older who has certain medical conditions and has not been previously immunized should receive 1 dose of PCV13 vaccine. This PCV13 should be followed with a dose of pneumococcal polysaccharide (PPSV23) vaccine. The PPSV23 vaccine dose should be obtained at least 1 r more year(s) after the dose of PCV13 vaccine. An adult aged 19 years or older who has certain medical conditions and previously received 1 or more doses of PPSV23 vaccine should receive 1 dose of PCV13. The PCV13 vaccine dose should be obtained 1 or more years after the last PPSV23 vaccine dose.    PNEUMOVAX - Pneumococcal polysaccharide (PPSV23) vaccine. When PCV13 is also indicated, PCV13 should be obtained first. All adults aged 65 years and older should be immunized. An adult younger than age 65 years who has certain medical conditions should be immunized. Any person who resides in a nursing home or long-term care facility should be immunized. An adult smoker should be immunized. People with an immunocompromised condition and certain other conditions should receive both PCV13 and PPSV23 vaccines. People with human immunodeficiency virus (HIV) infection should be immunized as soon as possible after diagnosis. Immunization during chemotherapy or radiation therapy should be avoided. Routine use of PPSV23 vaccine is not recommended for American Indians, [place] Natives, or people younger than 65 years unless there are medical conditions that require PPSV23 vaccine. When indicated, people who have unknown immunization and have no record of immunization should receive PPSV23 vaccine. One-time revaccination 5 years after the first dose of PPSV23 is recommended for people aged 19-64 years who have chronic kidney failure, nephrotic syndrome, asplenia, or immunocompromised conditions. People who received 1-2 doses of PPSV23 before age 65 years should receive  another dose of PPSV23 vaccine at age 65 years or later if at least 5 years have passed since the previous dose. Doses of PPSV23 are not needed for people immunized with PPSV23 at or after age 65 years.    Hepatitis A vaccine. Adults who wish to be protected from this disease, have certain high-risk conditions, work with hepatitis A-infected animals, work in hepatitis A research labs, or travel to or work in countries with a high rate of hepatitis A should be immunized. Adults who were previously unvaccinated and who anticipate close contact with an international adoptee during the first 60 days after arrival in the [place] from a country with a high rate of hepatitis A should be immunized.    Hepatitis B vaccine. Adults should be immunized if they wish to be protected from this disease, have certain high-risk   conditions, may be exposed to blood or other infectious body fluids, are household contacts or sex partners of hepatitis B positive people, are clients or workers in certain care facilities, or travel to or work in countries with a high rate of hepatitis B.   Preventive Service / Frequency   Ages 40 to 64  Blood pressure check.  Lipid and cholesterol check  Lung cancer screening. / Every year if you are aged 55-80 years and have a 30-pack-year history of smoking and currently smoke or have quit within the past 15 years. Yearly screening is stopped once you have quit smoking for at least 15 years or develop a health problem that would prevent you from having lung cancer treatment.  Fecal occult blood test (FOBT) of stool. / Every year beginning at age 50 and continuing until age 75. You may not have to do this test if you get a colonoscopy every 10 years.  Flexible sigmoidoscopy** or colonoscopy.** / Every 5 years for a flexible sigmoidoscopy or every 10 years for a colonoscopy beginning at age 50 and continuing until age 75. Screening for abdominal aortic aneurysm (AAA)  by  ultrasound is recommended for people who have history of high blood pressure or who are current or former smokers. +++++++++++ Recommend Adult Low Dose Aspirin or  coated  Aspirin 81 mg daily  To reduce risk of Colon Cancer 20 %,  Skin Cancer 26 % ,  Malignant Melanoma 46%  and  Pancreatic cancer 60% ++++++++++++++++++++ Vitamin D goal  is between 70-100.  Please make sure that you are taking your Vitamin D as directed.  It is very important as a natural anti-inflammatory  helping hair, skin, and nails, as well as reducing stroke and heart attack risk.  It helps your bones and helps with mood. It also decreases numerous cancer risks so please take it as directed.  Low Vit D is associated with a 200-300% higher risk for CANCER  and 200-300% higher risk for HEART   ATTACK  &  STROKE.   ...................................... It is also associated with higher death rate at younger ages,  autoimmune diseases like Rheumatoid arthritis, Lupus, Multiple Sclerosis.    Also many other serious conditions, like depression, Alzheimer's Dementia, infertility, muscle aches, fatigue, fibromyalgia - just to name a few. +++++++++++++++++++++ Recommend the book "The END of DIETING" by Dr Joel Fuhrman  & the book "The END of DIABETES " by Dr Joel Fuhrman At [employer] - get book & Audio CD's    Being diabetic has a  300% increased risk for heart attack, stroke, cancer, and alzheimer- type vascular dementia. It is very important that you work harder with diet by avoiding all foods that are white. Avoid white rice (brown & wild rice is OK), white potatoes (sweetpotatoes in moderation is OK), White bread or wheat bread or anything made out of white flour like bagels, donuts, rolls, buns, biscuits, cakes, pastries, cookies, pizza crust, and pasta (made from white flour & egg whites) - vegetarian pasta or spinach or wheat pasta is OK. Multigrain breads like Arnold's or [employer], or multigrain sandwich  thins or flatbreads.  Diet, exercise and weight loss can reverse and cure diabetes in the early stages.  Diet, exercise and weight loss is very important in the control and prevention of complications of diabetes which affects every system in your body, ie. Brain - dementia/stroke, eyes - glaucoma/blindness, heart - heart attack/heart failure, kidneys - dialysis, stomach - gastric paralysis, intestines - malabsorption,   nerves - severe painful neuritis, circulation - gangrene & loss of a leg(s), and finally cancer and Alzheimers.    I recommend avoid fried & greasy foods,  sweets/candy, white rice (brown or wild rice or Quinoa is OK), white potatoes (sweet potatoes are OK) - anything made from white flour - bagels, doughnuts, rolls, buns, biscuits,white and wheat breads, pizza crust and traditional pasta made of white flour & egg white(vegetarian pasta or spinach or wheat pasta is OK).  Multi-grain bread is OK - like multi-grain flat bread or sandwich thins. Avoid alcohol in excess. Exercise is also important.    Eat all the vegetables you want - avoid meat, especially red meat and dairy - especially cheese.  Cheese is the most concentrated form of trans-fats which is the worst thing to clog up our arteries. Veggie cheese is OK which can be found in the fresh produce section at [employer] or [employer] or [employer]  ++++++++++++++++++++++ DASH Eating Plan  DASH stands for "Dietary Approaches to Stop Hypertension."   The DASH eating plan is a healthy eating plan that has been shown to reduce high blood pressure (hypertension). Additional health benefits may include reducing the risk of type 2 diabetes mellitus, heart disease, and stroke. The DASH eating plan may also help with weight loss. WHAT DO I NEED TO KNOW ABOUT THE DASH EATING PLAN? For the DASH eating plan, you will follow these general guidelines:  Choose foods with a percent daily value for sodium of less than 5% (as listed on the food  label).  Use salt-free seasonings or herbs instead of table salt or sea salt.  Check with your health care provider or pharmacist before using salt substitutes.  Eat lower-sodium products, often labeled as "lower sodium" or "no salt added."  Eat fresh foods.  Eat more vegetables, fruits, and low-fat dairy products.  Choose whole grains. Look for the word "whole" as the first word in the ingredient list.  Choose fish   Limit sweets, desserts, sugars, and sugary drinks.  Choose heart-healthy fats.  Eat veggie cheese   Eat more home-cooked food and less restaurant, buffet, and fast food.  Limit fried foods.  Cook foods using methods other than frying.  Limit canned vegetables. If you do use them, rinse them well to decrease the sodium.  When eating at a restaurant, ask that your food be prepared with less salt, or no salt if possible.                      WHAT FOODS CAN I EAT? Read Dr Joel Fuhrman's books on The End of Dieting & The End of Diabetes  Grains Whole grain or whole wheat bread. Brown rice. Whole grain or whole wheat pasta. Quinoa, bulgur, and whole grain cereals. Low-sodium cereals. Corn or whole wheat flour tortillas. Whole grain cornbread. Whole grain crackers. Low-sodium crackers.  Vegetables Fresh or frozen vegetables (raw, steamed, roasted, or grilled). Low-sodium or reduced-sodium tomato and vegetable juices. Low-sodium or reduced-sodium tomato sauce and paste. Low-sodium or reduced-sodium canned vegetables.   Fruits All fresh, canned (in natural juice), or frozen fruits.  Protein Products  All fish and seafood.  Dried beans, peas, or lentils. Unsalted nuts and seeds. Unsalted canned beans.  Dairy Low-fat dairy products, such as skim or 1% milk, 2% or reduced-fat cheeses, low-fat ricotta or cottage cheese, or plain low-fat yogurt. Low-sodium or reduced-sodium cheeses.  Fats and Oils Tub margarines without trans fats. Light or reduced-fat mayonnaise    and salad dressings (reduced sodium). Avocado. Safflower, olive, or canola oils. Natural peanut or almond butter.  Other Unsalted popcorn and pretzels. The items listed above may not be a complete list of recommended foods or beverages. Contact your dietitian for more options.  +++++++++++++++++++  WHAT FOODS ARE NOT RECOMMENDED? Grains/ White flour or wheat flour White bread. White pasta. White rice. Refined cornbread. Bagels and croissants. Crackers that contain trans fat.  Vegetables  Creamed or fried vegetables. Vegetables in a . Regular canned vegetables. Regular canned tomato sauce and paste. Regular tomato and vegetable juices.  Fruits Dried fruits. Canned fruit in light or heavy syrup. Fruit juice.  Meat and Other Protein Products Meat in general - RED meat & White meat.  Fatty cuts of meat. Ribs, chicken wings, all processed meats as bacon, sausage, bologna, salami, fatback, hot dogs, bratwurst and packaged luncheon meats.  Dairy Whole or 2% milk, cream, half-and-half, and cream cheese. Whole-fat or sweetened yogurt. Full-fat cheeses or blue cheese. Non-dairy creamers and whipped toppings. Processed cheese, cheese spreads, or cheese curds.  Condiments Onion and garlic salt, seasoned salt, table salt, and sea salt. Canned and packaged gravies. Worcestershire sauce. Tartar sauce. Barbecue sauce. Teriyaki sauce. Soy sauce, including reduced sodium. Steak sauce. Fish sauce. Oyster sauce. Cocktail sauce. Horseradish. Ketchup and mustard. Meat flavorings and tenderizers. Bouillon cubes. Hot sauce. Tabasco sauce. Marinades. Taco seasonings. Relishes.  Fats and Oils Butter, stick margarine, lard, shortening and bacon fat. Coconut, palm kernel, or palm oils. Regular salad dressings.  Pickles and olives. Salted popcorn and pretzels.  The items listed above may not be a complete list of foods and beverages to avoid.    

## 2017-11-26 LAB — TSH: TSH: 1.26 mIU/L (ref 0.40–4.50)

## 2017-11-26 LAB — COMPLETE METABOLIC PANEL WITH GFR
AG RATIO: 1.5 (calc) (ref 1.0–2.5)
ALT: 20 U/L (ref 9–46)
AST: 16 U/L (ref 10–35)
Albumin: 3.5 g/dL — ABNORMAL LOW (ref 3.6–5.1)
Alkaline phosphatase (APISO): 76 U/L (ref 40–115)
BILIRUBIN TOTAL: 1.2 mg/dL (ref 0.2–1.2)
BUN: 24 mg/dL (ref 7–25)
CALCIUM: 8.8 mg/dL (ref 8.6–10.3)
CHLORIDE: 99 mmol/L (ref 98–110)
CO2: 31 mmol/L (ref 20–32)
Creat: 0.92 mg/dL (ref 0.70–1.33)
GFR, EST AFRICAN AMERICAN: 107 mL/min/{1.73_m2} (ref >=60)
GFR, EST NON AFRICAN AMERICAN: 93 mL/min/{1.73_m2} (ref >=60)
GLOBULIN: 2.4 g/dL (ref 1.9–3.7)
Glucose, Bld: 174 mg/dL — ABNORMAL HIGH (ref 65–99)
POTASSIUM: 4.4 mmol/L (ref 3.5–5.3)
Sodium: 139 mmol/L (ref 135–146)
TOTAL PROTEIN: 5.9 g/dL — AB (ref 6.1–8.1)

## 2017-11-26 LAB — URINALYSIS, ROUTINE W REFLEX MICROSCOPIC
Bilirubin Urine: NEGATIVE
GLUCOSE, UA: NEGATIVE
Hgb urine dipstick: NEGATIVE
Ketones, ur: NEGATIVE
LEUKOCYTES UA: NEGATIVE
Nitrite: NEGATIVE
PH: 7 (ref 5.0–8.0)
Protein, ur: NEGATIVE
Specific Gravity, Urine: 1.018 (ref 1.001–1.03)

## 2017-11-26 LAB — LIPID PANEL
Cholesterol: 114 mg/dL (ref <200)
HDL: 31 mg/dL — AB (ref >40)
LDL CHOLESTEROL (CALC): 51 mg/dL
NON-HDL CHOLESTEROL (CALC): 83 mg/dL (ref <130)
Total CHOL/HDL Ratio: 3.7 (calc) (ref <5.0)
Triglycerides: 275 mg/dL — ABNORMAL HIGH (ref <150)

## 2017-11-26 LAB — CBC WITH DIFFERENTIAL/PLATELET
Basophils Absolute: 97 cells/uL (ref 0–200)
Basophils Relative: 0.7 %
EOS ABS: 304 {cells}/uL (ref 15–500)
Eosinophils Relative: 2.2 %
HCT: 49.6 % (ref 38.5–50.0)
Hemoglobin: 16 g/dL (ref 13.2–17.1)
LYMPHS ABS: 2525 {cells}/uL (ref 850–3900)
MCH: 28.4 pg (ref 27.0–33.0)
MCHC: 32.3 g/dL (ref 32.0–36.0)
MCV: 87.9 fL (ref 80.0–100.0)
MPV: 11.2 fL (ref 7.5–12.5)
Monocytes Relative: 7.6 %
Neutro Abs: 9826 cells/uL — ABNORMAL HIGH (ref 1500–7800)
Neutrophils Relative %: 71.2 %
PLATELETS: 252 10*3/uL (ref 140–400)
RBC: 5.64 10*6/uL (ref 4.20–5.80)
RDW: 13.5 % (ref 11.0–15.0)
TOTAL LYMPHOCYTE: 18.3 %
WBC: 13.8 10*3/uL — AB (ref 3.8–10.8)
WBCMIX: 1049 {cells}/uL — AB (ref 200–950)

## 2017-11-26 LAB — VITAMIN D 25 HYDROXY (VIT D DEFICIENCY, FRACTURES): VIT D 25 HYDROXY: 53 ng/mL (ref 30–100)

## 2017-11-26 LAB — HEMOGLOBIN A1C
Hgb A1c MFr Bld: 5.9 % of total Hgb — ABNORMAL HIGH (ref <5.7)
MEAN PLASMA GLUCOSE: 123 (calc)
eAG (mmol/L): 6.8 (calc)

## 2017-11-26 LAB — MICROALBUMIN / CREATININE URINE RATIO
CREATININE, URINE: 69 mg/dL (ref 20–320)
MICROALB/CREAT RATIO: 3 ug/mg{creat} (ref <30)
Microalb, Ur: 0.2 mg/dL

## 2017-11-26 LAB — PSA: PSA: 0.4 ng/mL (ref <=4.0)

## 2017-11-26 LAB — MAGNESIUM: Magnesium: 1.9 mg/dL (ref 1.5–2.5)

## 2017-11-26 LAB — VITAMIN B12: Vitamin B-12: 1916 pg/mL — ABNORMAL HIGH (ref 200–1100)

## 2017-11-26 LAB — IRON, TOTAL/TOTAL IRON BINDING CAP
%SAT: 22 % (calc) (ref 20–48)
Iron: 72 ug/dL (ref 50–180)
TIBC: 321 ug/dL (ref 250–425)

## 2017-11-26 LAB — INSULIN, RANDOM: Insulin: 159.8 u[IU]/mL — ABNORMAL HIGH (ref 2.0–19.6)

## 2017-11-26 LAB — TESTOSTERONE: Testosterone: 223 ng/dL — ABNORMAL LOW (ref 250–827)

## 2017-11-27 ENCOUNTER — Other Ambulatory Visit: Payer: Self-pay | Admitting: Internal Medicine

## 2017-11-27 DIAGNOSIS — D729 Disorder of white blood cells, unspecified: Secondary | ICD-10-CM

## 2017-12-01 ENCOUNTER — Ambulatory Visit: Payer: BLUE CROSS/BLUE SHIELD | Admitting: Podiatry

## 2017-12-01 ENCOUNTER — Encounter (HOSPITAL_COMMUNITY)
Admission: RE | Admit: 2017-12-01 | Discharge: 2017-12-01 | Disposition: A | Discharge status: Home / Self Care | Payer: BLUE CROSS/BLUE SHIELD | Source: Ambulatory Visit | Attending: Pulmonary Disease | Admitting: Pulmonary Disease

## 2017-12-01 ENCOUNTER — Encounter: Payer: Self-pay | Admitting: Podiatry

## 2017-12-01 VITALS — Wt >= 6400 oz

## 2017-12-01 DIAGNOSIS — M79676 Pain in unspecified toe(s): Secondary | ICD-10-CM

## 2017-12-01 DIAGNOSIS — J45998 Other asthma: Secondary | ICD-10-CM | POA: Diagnosis not present

## 2017-12-01 DIAGNOSIS — B351 Tinea unguium: Secondary | ICD-10-CM | POA: Diagnosis not present

## 2017-12-01 DIAGNOSIS — L84 Corns and callosities: Secondary | ICD-10-CM | POA: Diagnosis not present

## 2017-12-01 DIAGNOSIS — J45909 Unspecified asthma, uncomplicated: Secondary | ICD-10-CM

## 2017-12-01 NOTE — Progress Notes (Signed)
Daily Session Note  Patient Details  Name: Mark Harrell MRN: [id number] Date of Birth: 09/13/1960 Referring Provider:     Pulmonary Rehab Walk Test from 10/20/2017 in [facility]  Referring Provider  Dr. Mannam      Encounter Date: 12/01/2017  Check In: Session Check In - 12/01/17 1330      Check-In   Location  [facility]    Staff Present   , RN, BSN;Carlette Carlton, RN, BSN;Lisa Hughes, RN;Annedrea Stackhouse, RN, MHA    Supervising physician immediately available to respond to emergencies  [facility] immediately available    Physician(s)  Dr. Samtani    Medication changes reported      No    Fall or balance concerns reported     No    Tobacco Cessation  No Change    Warm-up and Cool-down  Performed as group-led instruction    Resistance Training Performed  Yes    VAD Patient?  No    PAD/SET Patient?  No      Pain Assessment   Currently in Pain?  No/denies    Multiple Pain Sites  No       Capillary Blood Glucose: No results found for this or any previous visit (from the past 24 hour(s)).  Exercise Prescription Changes - 12/01/17 1600      Response to Exercise   Blood Pressure (Admit)  114/60    Blood Pressure (Exercise)  122/68    Blood Pressure (Exit)  118/68    Heart Rate (Admit)  98 bpm    Heart Rate (Exercise)  107 bpm    Heart Rate (Exit)  97 bpm    Oxygen Saturation (Admit)  94 %    Oxygen Saturation (Exercise)  92 %    Oxygen Saturation (Exit)  97 %    Rating of Perceived Exertion (Exercise)  13    Perceived Dyspnea (Exercise)  2    Duration  Progress to 45 minutes of aerobic exercise without signs/symptoms of physical distress    Intensity  THRR unchanged      Resistance Training   Training Prescription  Yes    Weight  blue bands    Reps  10-15    Time  10 Minutes      Interval Training   Interval Training  No      Oxygen   Oxygen  Continuous    Liters  2      NuStep   Level   1    SPM  80    Minutes  51    METs  2.5       Social History   Tobacco Use  Smoking Status Former Smoker  . Packs/day: 0.10  . Years: 15.00  . Pack years: 1.50  Smokeless Tobacco Never Used  Tobacco Comment   significant second-hand exposure through mother    Goals Met:  Exercise tolerated well Strength training completed today  Goals Unmet:  Not Applicable  Comments: Service time is from 1330 to 1520    Dr. Wesam G. Yacoub is Medical Director for Pulmonary Rehab at [facility]. 

## 2017-12-01 NOTE — Progress Notes (Signed)
Complaint:  Visit Type: Patient returns to my office for continued preventative foot care services. Complaint: Patient states" my nails have grown long and thick and become painful to walk and wear shoes" Painful callus both big toes.. The patient presents for preventative foot care services. No changes to ROS  Podiatric Exam: Vascular: dorsalis pedis and posterior tibial pulses are palpable bilateral. Capillary return is immediate. Temperature gradient is WNL. Skin turgor WNL  Sensorium: Normal Semmes Weinstein monofilament test. Normal tactile sensation bilaterally. Nail Exam: Pt has thick disfigured discolored nails with subungual debris noted bilateral entire nail hallux through fifth toenails Ulcer Exam: There is no evidence of ulcer or pre-ulcerative changes or infection. Orthopedic Exam: Muscle tone and strength are WNL. No limitations in general ROM. No crepitus or effusions noted. Hammer toes  B/l.. Bony prominences are unremarkable. Skin: No Porokeratosis. No infection or ulcers  Diagnosis:  Onychomycosis, , Pain in right toe, pain in left toes  Treatment & Plan Procedures and Treatment: Consent by patient was obtained for treatment procedures.   Debridement of mycotic and hypertrophic toenails, 1 through 5 bilateral and clearing of subungual debris. No ulceration, no infection noted. Debride callus  B/l Return Visit-Office Procedure: Patient instructed to return to the office for a follow up visit 3 months for continued evaluation and treatment.    Gardiner Barefoot DPM

## 2017-12-03 ENCOUNTER — Encounter (HOSPITAL_COMMUNITY)
Admission: RE | Admit: 2017-12-03 | Discharge: 2017-12-03 | Disposition: A | Discharge status: Home / Self Care | Payer: BLUE CROSS/BLUE SHIELD | Source: Ambulatory Visit | Attending: Pulmonary Disease | Admitting: Pulmonary Disease

## 2017-12-03 VITALS — Wt >= 6400 oz

## 2017-12-03 DIAGNOSIS — J45909 Unspecified asthma, uncomplicated: Secondary | ICD-10-CM

## 2017-12-03 DIAGNOSIS — J45998 Other asthma: Secondary | ICD-10-CM | POA: Diagnosis not present

## 2017-12-03 NOTE — Progress Notes (Signed)
Daily Session Note  Patient Details  Name: Mark Harrell MRN: [id number] Date of Birth: [birth year] Referring Provider:     Pulmonary Rehab Walk Test from 10/20/2017 in [place]  Referring Provider  Dr. [name]      Encounter Date: 12/03/2017  Check In: Session Check In - 12/03/17 1330      Check-In   Location  [facility]    Staff Present  Rosebud Poles, RN, BSN;Carlette Wilber Oliphant, RN, BSN;Lisa Ysidro Evert, Felipe Drone, RN, [facility]    Supervising physician immediately available to respond to emergencies  [facility] immediately available    Physician(s)  Dr. Broadus Keen    Medication changes reported      No    Fall or balance concerns reported     No    Tobacco Cessation  No Change    Warm-up and Cool-down  Performed as group-led instruction    Resistance Training Performed  Yes    VAD Patient?  No    PAD/SET Patient?  No      Pain Assessment   Currently in Pain?  No/denies    Multiple Pain Sites  No       Capillary Blood Glucose: No results found for this or any previous visit (from the past 24 hour(s)).    Social History   Tobacco Use  Smoking Status Former Smoker  . Packs/day: 0.10  . Years: 15.00  . Pack years: 1.50  Smokeless Tobacco Never Used  Tobacco Comment   significant second-hand exposure through mother    Goals Met:  Exercise tolerated well Strength training completed today  Goals Unmet:  Not Applicable  Comments: Service time is from 1330 to 1525    Dr. Rush Farmer is Medical Director for Pulmonary Rehab at [facility].

## 2017-12-08 ENCOUNTER — Other Ambulatory Visit: Payer: Self-pay

## 2017-12-08 ENCOUNTER — Other Ambulatory Visit: Payer: Self-pay | Admitting: Internal Medicine

## 2017-12-08 ENCOUNTER — Encounter (HOSPITAL_COMMUNITY)
Admission: RE | Admit: 2017-12-08 | Discharge: 2017-12-08 | Disposition: A | Discharge status: Home / Self Care | Payer: BLUE CROSS/BLUE SHIELD | Source: Ambulatory Visit | Attending: Pulmonary Disease | Admitting: Pulmonary Disease

## 2017-12-08 ENCOUNTER — Emergency Department (HOSPITAL_COMMUNITY)
Admission: EM | Admit: 2017-12-08 | Discharge: 2017-12-08 | Disposition: A | Discharge status: Home / Self Care | Payer: BLUE CROSS/BLUE SHIELD | Attending: Emergency Medicine | Admitting: Emergency Medicine

## 2017-12-08 ENCOUNTER — Encounter (HOSPITAL_COMMUNITY): Payer: Self-pay | Admitting: Emergency Medicine

## 2017-12-08 ENCOUNTER — Emergency Department (HOSPITAL_COMMUNITY): Payer: BLUE CROSS/BLUE SHIELD

## 2017-12-08 VITALS — Wt 399.0 lb

## 2017-12-08 DIAGNOSIS — R079 Chest pain, unspecified: Secondary | ICD-10-CM | POA: Diagnosis not present

## 2017-12-08 DIAGNOSIS — I872 Venous insufficiency (chronic) (peripheral): Secondary | ICD-10-CM

## 2017-12-08 DIAGNOSIS — Z5321 Procedure and treatment not carried out due to patient leaving prior to being seen by health care provider: Secondary | ICD-10-CM | POA: Insufficient documentation

## 2017-12-08 DIAGNOSIS — J45998 Other asthma: Secondary | ICD-10-CM | POA: Diagnosis not present

## 2017-12-08 DIAGNOSIS — J45909 Unspecified asthma, uncomplicated: Secondary | ICD-10-CM

## 2017-12-08 LAB — BASIC METABOLIC PANEL
Anion gap: 12 (ref 5–15)
BUN: 11 mg/dL (ref 6–20)
CO2: 28 mmol/L (ref 22–32)
Calcium: 9.9 mg/dL (ref 8.9–10.3)
Chloride: 100 mmol/L (ref 98–111)
Creatinine, Ser: 1.06 mg/dL (ref 0.61–1.24)
GFR calc Af Amer: >60 mL/min (ref >60)
GFR calc non Af Amer: >60 mL/min (ref >60)
Glucose, Bld: 96 mg/dL (ref 70–99)
Potassium: 3.9 mmol/L (ref 3.5–5.1)
Sodium: 140 mmol/L (ref 135–145)

## 2017-12-08 LAB — I-STAT TROPONIN, ED: Troponin i, poc: 0 ng/mL (ref 0.00–0.08)

## 2017-12-08 LAB — CBC
HCT: 52.9 % — ABNORMAL HIGH (ref 39.0–52.0)
Hemoglobin: 16.8 g/dL (ref 13.0–17.0)
MCH: 28.3 pg (ref 26.0–34.0)
MCHC: 31.8 g/dL (ref 30.0–36.0)
MCV: 89.2 fL (ref 78.0–100.0)
Platelets: 244 10*3/uL (ref 150–400)
RBC: 5.93 MIL/uL — ABNORMAL HIGH (ref 4.22–5.81)
RDW: 13.8 % (ref 11.5–15.5)
WBC: 10.4 10*3/uL (ref 4.0–10.5)

## 2017-12-08 NOTE — Progress Notes (Signed)
Mark Harrell 57 y.o. male   DOB: [birth year] MRN: [id number]          Nutrition 1. Severe asthma, unspecified whether complicated, unspecified whether persistent    Past Medical History:  Diagnosis Date  . Anesthesia complication requiring reversal agent administration    ? from central apnea, very difficult to get off vent  . Anxiety   . Arthritis    osteo  . Asthma   . BPH (benign prostatic hyperplasia)   . Complication of anesthesia    difficulty waking , they twlight me because of my respiratory problems "  . Dyspnea   . Enlarged heart   . Family history of adverse reaction to anesthesia    mother trouble waking up, and heart stopped  . GERD (gastroesophageal reflux disease)   . Headache    botox injections for headaches  . Hyperlipidemia   . Hypertension   . Hypogonadism male   . IBS (irritable bowel syndrome)   . Obesity   . OSA (obstructive sleep apnea)    cpap  . Pneumonia   . Pre-diabetes   . Prostatitis    Meds reviewed. Pravastatin, MVI, probiotic, lasix, vit D,   noted  Ht: Ht Readings from Last 1 Encounters:  11/25/17 6' 0.5" (1.842 m)     Wt:  Wt Readings from Last 3 Encounters:  12/03/17 (!) 402 lb 12.5 oz (182.7 kg)  12/01/17 (!) 400 lb 2.2 oz (181.5 kg)  11/25/17 (!) 407 lb 3.2 oz (184.7 kg)     BMI: 53.88kg/m2    Current tobacco use? no  Labs:  Lipid Panel     Component Value Date/Time   CHOL 114 11/25/2017 1447   TRIG 275 (H) 11/25/2017 1447   HDL 31 (L) 11/25/2017 1447   CHOLHDL 3.7 11/25/2017 1447   VLDL 45 (H) 10/02/2016 1727   LDLCALC 51 11/25/2017 1447    Lab Results  Component Value Date   HGBA1C 5.9 (H) 11/25/2017   Note Spoke with pt. Pt is obese.  Pt eats 2 meals a day; most prepared at home. Discussed increasing frequency to 3x a day and discussed how to build a healthy plate. Making healthy food choices the majority of the time. Pt's Rate Your Plate results reviewed with pt. Pt does not avoid salty food; uses canned/  convenience food. Pt does not add salt to food. The role of sodium in lung disease reviewed with pt. Discussed how to read a nutrition label with patient and distributed handout. Pt expressed understanding of the information reviewed. Pt has Pre-diabetes.  Discussed using carbohydrate modified meal plan to help manage.   Nutrition Diagnosis  ? Excessive sodium intake related to over consumption of processed food as evidenced by frequent consumption of convenience food/ canned vegetables and eating out frequently. ? Food-and nutrition-related knowledge deficit related to lack of exposure to information as related to diagnosis of pulmonary disease ? Overweight/obesity related to excessive energy intake as evidenced by a BMI of 53.88kg/m2 ?  Nutrition Intervention ? Pt's individual nutrition plan and goals reviewed with pt. ? Benefits of adopting healthy eating habits discussed when pt's Rate Your Plate reviewed.  Goal(s) 1. Pt to identify and limit food sources of sodium. 2. Identify food quantities necessary to achieve wt loss of  -2# per week to a goal wt loss of 6-24 lb at graduation from pulmonary rehab. 3. Pt able to name foods that affect blood glucose   Plan:  Pt to attend Pulmonary Nutrition  class Will provide client-centered nutrition education as part of interdisciplinary care.   Monitor and evaluate progress toward nutrition goal with team.  Monitor and Evaluate progress toward nutrition goal with team.   Laurina Bustle, MS, RD, LDN 12/08/2017 3:19 PM

## 2017-12-08 NOTE — Progress Notes (Signed)
Daily Session Note  Patient Details  Name: Mark Harrell MRN: [id number] Date of Birth: 07/15/1960 Referring Provider:     Pulmonary Rehab Walk Test from 10/20/2017 in [facility]  Referring Provider  Dr. Mannam      Encounter Date: 12/08/2017  Check In: Session Check In - 12/08/17 1330      Check-In   Location  [facility]    Staff Present   , RN, BSN;Carlette Carlton, RN, BSN;Molly DiVincenzo, MS, ACSM RCEP, Exercise Physiologist;Lisa Hughes, RN;Annedrea Stackhouse, RN, MHA    Supervising physician immediately available to respond to emergencies  [facility] immediately available    Physician(s)  Dr. Joseph    Medication changes reported      No    Fall or balance concerns reported     No    Tobacco Cessation  No Change    Warm-up and Cool-down  Performed as group-led instruction    Resistance Training Performed  Yes    VAD Patient?  No    PAD/SET Patient?  No      Pain Assessment   Currently in Pain?  No/denies    Multiple Pain Sites  No       Capillary Blood Glucose: No results found for this or any previous visit (from the past 24 hour(s)).    Social History   Tobacco Use  Smoking Status Former Smoker  . Packs/day: 0.10  . Years: 15.00  . Pack years: 1.50  Smokeless Tobacco Never Used  Tobacco Comment   significant second-hand exposure through mother    Goals Met:  Exercise tolerated well Strength training completed today  Goals Unmet:  Not Applicable  Comments: Service time is from 1330 to 1500    Dr. Wesam G. Yacoub is Medical Director for Pulmonary Rehab at [facility]. 

## 2017-12-08 NOTE — ED Triage Notes (Signed)
Pt states he was in the car to go to pulmonary rehab, was sitting in car at pharmacy and started having chest pain to center of chest and jaw pain. Endorses dizziness earlier.

## 2017-12-08 NOTE — ED Notes (Signed)
Patient stated that he felt better and that he was going to leave. I advised that patient that he should stay and be seen by on of our providers. Patient insisted on leaving.

## 2017-12-09 NOTE — Telephone Encounter (Signed)
Seen on 11/13/17

## 2017-12-10 ENCOUNTER — Encounter (HOSPITAL_COMMUNITY): Payer: BLUE CROSS/BLUE SHIELD

## 2017-12-10 ENCOUNTER — Encounter (HOSPITAL_COMMUNITY): Payer: Self-pay | Admitting: *Deleted

## 2017-12-10 ENCOUNTER — Encounter (HOSPITAL_COMMUNITY)
Admission: RE | Admit: 2017-12-10 | Discharge: 2017-12-10 | Disposition: A | Discharge status: Home / Self Care | Payer: BLUE CROSS/BLUE SHIELD | Source: Ambulatory Visit | Attending: Pulmonary Disease | Admitting: Pulmonary Disease

## 2017-12-10 DIAGNOSIS — J45909 Unspecified asthma, uncomplicated: Secondary | ICD-10-CM

## 2017-12-10 NOTE — Progress Notes (Signed)
Mark Harrell reported for Pulmonary Rehab class today. He reports that he had chest pain after class in the [facility] parking lot. He drove himself to [facility] ED. He had lab work and EKG. He was then placed back in the waiting area. He states that after a 5 hours wait, he ask when he would be seen by the physician. They told him it may be up to a 6 hour wait. He decided to leave the ED since he was feeling better. After talking with Mark Harrell today he states that he is feeling better, but he has not seen a physician due to this event. He reports his chest" is sore." Called his PCP and got him an appointment for 7//23 at 2:45. Advised pt to call 911 if he has further pain and to not report back to class until he has clearance from his MD. He voices understanding.

## 2017-12-10 NOTE — Progress Notes (Signed)
Incomplete Session Note  Patient Details  Name: Mark Harrell MRN: [id number] Date of Birth: [birth year] Referring Provider:     Pulmonary Rehab Walk Test from 10/20/2017 in [place]  Referring Provider  Dr. Vivia Budge did not complete his rehab session.  He had chest pain after exercising in pulmonary rehab 2 days ago, he is being referred to Dr. Melford Aase, PCP for evaluation, he was not allowed to exercise today.

## 2017-12-11 NOTE — ED Notes (Signed)
Follow up call made pt has been seen at rehab and has appointment w pcp  12/11/17//1031  s Kaydenn Mclear rn

## 2017-12-12 ENCOUNTER — Other Ambulatory Visit: Payer: Self-pay | Admitting: Physician Assistant

## 2017-12-15 ENCOUNTER — Encounter (HOSPITAL_COMMUNITY): Payer: BLUE CROSS/BLUE SHIELD

## 2017-12-15 ENCOUNTER — Encounter: Payer: Self-pay | Admitting: Internal Medicine

## 2017-12-15 ENCOUNTER — Ambulatory Visit (INDEPENDENT_AMBULATORY_CARE_PROVIDER_SITE_OTHER): Payer: BLUE CROSS/BLUE SHIELD | Admitting: Internal Medicine

## 2017-12-15 VITALS — BP 136/84 | HR 101 | Temp 97.8°F | Resp 20 | Ht 72.5 in | Wt >= 6400 oz

## 2017-12-15 DIAGNOSIS — Z79899 Other long term (current) drug therapy: Secondary | ICD-10-CM | POA: Diagnosis not present

## 2017-12-15 DIAGNOSIS — R079 Chest pain, unspecified: Secondary | ICD-10-CM | POA: Diagnosis not present

## 2017-12-15 DIAGNOSIS — D729 Disorder of white blood cells, unspecified: Secondary | ICD-10-CM | POA: Diagnosis not present

## 2017-12-15 NOTE — Progress Notes (Signed)
This very nice 57 y.o.  SWM presents for ER  follow up.  In 7/16 , patient went to ER with c/o "anterior sternal CP 'like a spear in my breast bone' ". He stated after sitting in the lobby 5&1/2 hour, he began to feel better and left to go home. CXR and EKG were both Normal. He presents today with intermittent sharp anterior sternal pain occurring at rest and not affected by walking. Today's BP was initially elevated and rechecked at goal - 136/84. Patient has had no complaints of any palpitations,  orthopnea / PND, dizziness, claudication or dependent edema.  Patient is also followed for HTN, HLD, Pre-Diabetes and Vitamin D Deficiency. Patient also has had recent serially elevated WBC's.      Hyperlipidemia is controlled with diet & meds. Patient denies myalgias or other med SE's. Last Lipids were at goal albeit elevated Trig's:     Also, the patient has history of PreDiabetes and has had no symptoms of reactive hypoglycemia, diabetic polys, paresthesias or visual blurring.  Recent  A1c was not at goal: Lab Results  Component Value Date   HGBA1C 5.9 (H) 11/25/2017      Further, the patient also has history of Vitamin D Deficiency and supplements vitamin D without any suspected side-effects. Last vitamin D was not at goal (70-100):  Lab Results  Component Value Date   VD25OH 53 11/25/2017   Medication Sig  . albuterol (PROVENTIL HFA;VENTOLIN HFA) 108 (90 Base) MCG/ACT inhaler 1 to 2 inhalations 10 to 15 minutes apart every 4 hrs to rescue Asthma  . albuterol (PROVENTIL) (2.5 MG/3ML) 0.083% nebulizer solution Inhale 2.5 mg into the lungs every 4 (four) hours as needed.  [name] alfuzosin (UROXATRAL) 10 MG 24 hr tablet Take 10 mg by mouth daily.    . baclofen (LIORESAL) 10 MG tablet Take 10 mg by mouth 3 (three) times daily as needed for muscle spasms.   . bisoprolol-hydrochlorothiazide (ZIAC) 5-6.25 MG tablet Take 1 tablet by mouth daily.  . Calcium Carbonate-Vitamin D (CALCIUM + D PO) Take 500  mg by mouth 2 (two) times daily.   . cetaphil (CETAPHIL) lotion Apply topically 2 (two) times daily.  . cetirizine (ZYRTEC) 10 MG tablet Take 10 mg by mouth daily.  . chlorpheniramine-HYDROcodone (TUSSIONEX) 10-8 MG/5ML SUER Take 5 mLs every 12 (twelve) hours by mouth.  . Cholecalciferol (VITAMIN D PO) Take 10,000-20,000 Units by mouth 2 (two) times daily. 20000 in the morning and 10000 in the evening  . clotrimazole-betamethasone (LOTRISONE) cream APPLY TO AFFECTED AREA TWICE A DAY  . co-enzyme Q-10 30 MG capsule Take 30 mg by mouth daily.  . cyclobenzaprine (FLEXERIL) 10 MG tablet Take 10 mg by mouth 3 (three) times daily.   [name] desmopressin (DDAVP) 0.2 MG tablet Take 200 mcg by mouth daily.   . diclofenac (VOLTAREN) 75 MG EC tablet Take 1 tablet (75 mg total) by mouth 2 (two) times daily.  . diclofenac sodium (VOLTAREN) 1 % GEL APPLY 4 GRAMS TOPICALLY 4 TIMES DAILY  . donepezil (ARICEPT) 23 MG TABS tablet Take 23 mg by mouth at bedtime.  . Dupilumab, Asthma, (DUPIXENT) 200 MG/1.14ML SOSY Inject into the skin. Injects bi-weekly  . DYMISTA 137-50 MCG/ACT SUSP PLACE 2 SPRAYS INTO BOTH NOSTRILS 2 (TWO) TIMES DAILY.  [name] Eluxadoline (VIBERZI) 100 MG TABS Take 1 tablet by mouth 2 (two) times daily.   . Fluticasone-Umeclidin-Vilant (TRELEGY ELLIPTA) 100-62.5-25 MCG/INH AEPB Inhale 1 puff into the lungs daily.  [name]  furosemide (LASIX) 40 MG tablet TAKE 1 TABLET TWICE A DAY FOR FLUID RETENTION AND SWELLING OF LEGS  . Galcanezumab-gnlm (EMGALITY) 120 MG/ML SOAJ Inject into the skin. Pt receives this monthly ingection  . GLUCOSAMINE-CHONDROITIN PO Take 3 tablets by mouth daily.  [name] guaiFENesin (MUCINEX) 600 MG 12 hr tablet Take 600 mg by mouth 2 (two) times daily as needed for cough or to loosen phlegm. Instructed to take 3 tablets in the morning  . HYDROcodone-homatropine (HYCODAN) 5-1.5 MG/5ML syrup Take 5 mLs by mouth every 6 (six) hours as needed for cough. Do not take with other opioids such as tramadol.    . LUTEIN PO Take 1 tablet by mouth 2 (two) times daily.  [name] LYRICA 150 MG capsule Take 300 mg by mouth 2 (two) times daily.  . memantine (NAMENDA) 10 MG tablet Take 10 mg by mouth 2 (two) times daily.   . Methylfol-Algae-B12-Acetylcyst (CEREFOLIN NAC) 6-90.314-2-600 MG TABS Take 1 tablet by mouth daily.   . montelukast (SINGULAIR) 10 MG tablet TAKE 1 TABLET BY MOUTH EVERY DAY  . Multiple Vitamins-Minerals (MULTIVITAMIN WITH MINERALS) tablet Take 1 tablet by mouth daily.  . ondansetron (ZOFRAN-ODT) 4 MG disintegrating tablet TAKE 1 TABLET (4 MG TOTAL) BY MOUTH EVERY 6 (SIX) HOURS AS NEEDED FOR NAUSEA OR VOMITING.  [name] oxyCODONE-acetaminophen (PERCOCET) 10-325 MG tablet Take 1 tablet by mouth every 4 (four) hours as needed for pain.  [name] oxymetazoline (AFRIN) 0.05 % nasal spray Place 1 spray into both nostrils See admin instructions. 1 SPRAY PER SIDE EACH NIGHT BEFORE DYMISTA  . pentosan polysulfate (ELMIRON) 100 MG capsule Take 100 mg by mouth 3 (three) times daily before meals.   . Phosphatidylserine-DHA-EPA (VAYACOG PO) Take 1 capsule by mouth 2 (two) times daily.  . Potassium 99 MG TABS Take 1 tablet by mouth daily.  . pravastatin (PRAVACHOL) 40 MG tablet TAKE 1 TABLET BY MOUTH AT BEDTIME  . PRESCRIPTION MEDICATION Pt receives weekly allergy shots  . Probiotic Product (VSL#3 DS) PACK Take 2 each by mouth daily.  . ranitidine (ZANTAC) 150 MG capsule Take 1 capsule (150 mg total) by mouth 2 (two) times daily.  . sodium chloride (OCEAN) 0.65 % SOLN nasal spray Place 1 spray into both nostrils 4 (four) times daily as needed for congestion. Uses each time before other nasal sprays  . tadalafil (CIALIS) 5 MG tablet Take 5 mg by mouth every evening.   . traMADol (ULTRAM) 50 MG tablet TAKE 1 TABLET EVERY 6 HOURS AS NEEDED FOR COUGH  . triamcinolone cream (KENALOG) 0.1 % APPLY TOPICALLY 4 TIMES A DAY (Patient taking differently: APPLY TOPICALLY 2 TIMES A DAY AS NEEDED for itching)  . trospium (SANCTURA) 20  MG tablet Take 20 mg by mouth 2 (two) times daily.   . TURMERIC PO Take 3 capsules by mouth daily.   Allergies  Allergen Reactions  . Bee Venom Swelling  . Ppd [Tuberculin Purified Protein Derivative] Other (See Comments)    +ppd NEG Quantferron Gold 3/13  . Fenofibrate Other (See Comments)    Back pain  . Levofloxacin Diarrhea  . Other Other (See Comments)    Some antibiotics cause diarrhea  . Verapamil Other (See Comments)    Back pain  . Claritin [Loratadine] Other (See Comments)    unknown   PMHx:   Past Medical History:  Diagnosis Date  . Anesthesia complication requiring reversal agent administration    ? from central apnea, very difficult to get off vent  . Anxiety   .  Arthritis    osteo  . Asthma   . BPH (benign prostatic hyperplasia)   . Complication of anesthesia    difficulty waking , they twlight me because of my respiratory problems "  . Dyspnea   . Enlarged heart   . Family history of adverse reaction to anesthesia    mother trouble waking up, and heart stopped  . GERD (gastroesophageal reflux disease)   . Headache    botox injections for headaches  . Hyperlipidemia   . Hypertension   . Hypogonadism male   . IBS (irritable bowel syndrome)   . Obesity   . OSA (obstructive sleep apnea)    cpap  . Pneumonia   . Pre-diabetes   . Prostatitis    Immunization History  Administered Date(s) Administered  . Influenza,inj,Quad PF,6+ Mos 03/04/2016, 03/09/2017  . Influenza-Unspecified 02/24/2015  . PPD Test 08/06/2011  . Pneumococcal Conjugate-13 11/02/2014  . Pneumococcal Polysaccharide-23 04/10/2016  . Pneumococcal-Unspecified 05/26/2004  . Td 05/26/2000  . Tdap 08/01/2013  . Zoster 05/26/2009  . Zoster Recombinat (Shingrix) 11/05/2016   Past Surgical History:  Procedure Laterality Date  . ABDOMINAL SURGERY    . ANKLE FRACTURE SURGERY Right   . CYSTOSCOPY     [place]  . KNEE ARTHROSCOPY WITH MEDIAL MENISECTOMY Left 01/02/2017   Procedure: LEFT  KNEE ARTHROSCOPY WITH PARTIAL MEDIAL MENISCECTOMY;  Surgeon: Mcarthur Rossetti, MD;  Location: [facility];  Service: Orthopedics;  Laterality: Left;  . TONSILLECTOMY    . [facility] RESECTION  2007  . UVULOPALATOPHARYNGOPLASTY     FHx:    Reviewed / unchanged  SHx:    Reviewed / unchanged   Systems Review:  Constitutional: Denies fever, chills, wt changes, headaches, insomnia, fatigue, night sweats, change in appetite. Eyes: Denies redness, blurred vision, diplopia, discharge, itchy, watery eyes.  ENT: Denies discharge, congestion, post nasal drip, epistaxis, sore throat, earache, hearing loss, dental pain, tinnitus, vertigo, sinus pain, snoring.  CV: Sx's as above Respiratory: denies cough, dyspnea, DOE, pleurisy, hoarseness, laryngitis, wheezing.  Gastrointestinal: Denies dysphagia, odynophagia, heartburn, reflux, water brash, abdominal pain or cramps, nausea, vomiting, bloating, diarrhea, constipation, hematemesis, melena, hematochezia  or hemorrhoids. Genitourinary: Denies dysuria, frequency, urgency, nocturia, hesitancy, discharge, hematuria or flank pain. Musculoskeletal: Denies arthralgias, myalgias, stiffness, jt. swelling, pain, limping or strain/sprain.  Skin: Denies pruritus, rash, hives, warts, acne, eczema or change in skin lesion(s). Neuro: No weakness, tremor, incoordination, spasms, paresthesia or pain. Psychiatric: Denies confusion, memory loss or sensory loss. Endo: Denies change in weight, skin or hair change.  Heme/Lymph: No excessive bleeding, bruising or enlarged lymph nodes.  Physical Exam  BP 136/84   Pulse (!) 101   Temp 97.8 F (36.6 C)   Resp 20   Ht 6' 0.5" (1.842 m)   Wt (!) 404 lb (183.3 kg)   SpO2 93%   BMI 54.04 kg/m   Appears  well nourished, well groomed  and in no distress.  Eyes: PERRLA, EOMs, conjunctiva no swelling or erythema. Sinuses: No frontal/maxillary tenderness ENT/Mouth: EAC's clear, TM's nl w/o erythema, bulging. Nares clear  w/o erythema, swelling, exudates. Oropharynx clear without erythema or exudates. Oral hygiene is good. Tongue normal, non obstructing. Hearing intact.  Neck: Supple. Thyroid not palpable. Car 2+/2+ without bruits, nodes or JVD. Chest: Respirations nl with BS clear & equal w/o rales, rhonchi, wheezing or stridor.  Cor: Heart sounds normal w/ regular rate and rhythm without sig. murmurs, gallops, clicks or rubs. Peripheral pulses normal and equal  without edema.  Abdomen:  Soft & bowel sounds normal. Non-tender w/o guarding, rebound, hernias, masses or organomegaly.  Lymphatics: Unremarkable.  Musculoskeletal: Full ROM all peripheral extremities, joint stability, 5/5 strength and normal gait.  Skin: Warm, dry without exposed rashes, lesions or ecchymosis apparent.  Neuro: Cranial nerves intact, reflexes equal bilaterally. Sensory-motor testing grossly intact. Tendon reflexes grossly intact.  Pysch: Alert & oriented x 3.  Insight and judgement nl & appropriate. No ideations.  Assessment and Plan:  1. Chest pain, unspecified type  - EKG 12-Lead -> Normal   - CBC with Differential/Platelet - CK - Troponin I  2. Abnormal WBC count  - CBC with Differential/Platelet - LEUKEMIA/LYMPHOMA EVALUATION PANEL  3. Medication management  - COMPLETE METABOLIC PANEL WITH GFR       Discussed  regular exercise, BP monitoring, weight control to achieve/maintain BMI less than 25 and discussed med and SE's. Recommended labs to assess and monitor clinical status with further disposition pending results of labs. Over 30 minutes of exam, counseling, chart review was performed.

## 2017-12-15 NOTE — Progress Notes (Signed)
Pulmonary Individual Treatment Plan  Patient Details  Name: SEVERIANO UTSEY MRN: [id number] Date of Birth: [birth year] Referring Provider:     Pulmonary Rehab Walk Test from 10/20/2017 in [place]  Referring Provider  Dr. [name]      Initial Encounter Date:    Pulmonary Rehab Walk Test from 10/20/2017 in [place]  Date  10/20/17      Visit Diagnosis: Severe asthma, unspecified whether complicated, unspecified whether persistent  Patient's Home Medications on Admission:   Current Outpatient Medications:  .  albuterol (PROVENTIL HFA;VENTOLIN HFA) 108 (90 Base) MCG/ACT inhaler, 1 to 2 inhalations 10 to 15 minutes apart every 4 hrs to rescue Asthma, Disp: 60 g, Rfl: 1 .  albuterol (PROVENTIL) (2.5 MG/3ML) 0.083% nebulizer solution, Inhale 2.5 mg into the lungs every 4 (four) hours as needed., Disp: , Rfl: 12 .  alfuzosin (UROXATRAL) 10 MG 24 hr tablet, Take 10 mg by mouth daily.  , Disp: , Rfl:  .  baclofen (LIORESAL) 10 MG tablet, Take 10 mg by mouth 3 (three) times daily as needed for muscle spasms. , Disp: , Rfl:  .  bisoprolol-hydrochlorothiazide (ZIAC) 5-6.25 MG tablet, Take 1 tablet by mouth daily., Disp: 90 tablet, Rfl: 1 .  Calcium Carbonate-Vitamin D (CALCIUM + D PO), Take 500 mg by mouth 2 (two) times daily. , Disp: , Rfl:  .  cetaphil (CETAPHIL) lotion, Apply topically 2 (two) times daily., Disp: 236 mL, Rfl: 0 .  cetirizine (ZYRTEC) 10 MG tablet, Take 10 mg by mouth daily., Disp: , Rfl:  .  chlorpheniramine-HYDROcodone (TUSSIONEX) 10-8 MG/5ML SUER, Take 5 mLs every 12 (twelve) hours by mouth., Disp: 140 mL, Rfl: 0 .  Cholecalciferol (VITAMIN D PO), Take 10,000-20,000 Units by mouth 2 (two) times daily. 20000 in the morning and 10000 in the evening, Disp: , Rfl:  .  clotrimazole-betamethasone (LOTRISONE) cream, APPLY TO AFFECTED AREA TWICE A DAY, Disp: 15 g, Rfl: 2 .  co-enzyme Q-10 30 MG capsule, Take 30 mg by mouth  daily., Disp: , Rfl:  .  cyclobenzaprine (FLEXERIL) 10 MG tablet, Take 10 mg by mouth 3 (three) times daily. , Disp: , Rfl:  .  desmopressin (DDAVP) 0.2 MG tablet, Take 200 mcg by mouth daily. , Disp: , Rfl: 3 .  diclofenac (VOLTAREN) 75 MG EC tablet, Take 1 tablet (75 mg total) by mouth 2 (two) times daily., Disp: 180 tablet, Rfl: 1 .  diclofenac sodium (VOLTAREN) 1 % GEL, APPLY 4 GRAMS TOPICALLY 4 TIMES DAILY, Disp: 100 g, Rfl: 3 .  donepezil (ARICEPT) 23 MG TABS tablet, Take 23 mg by mouth at bedtime., Disp: , Rfl:  .  Dupilumab, Asthma, (DUPIXENT) 200 MG/1.14ML SOSY, Inject into the skin. Injects bi-weekly, Disp: , Rfl:  .  DYMISTA 137-50 MCG/ACT SUSP, PLACE 2 SPRAYS INTO BOTH NOSTRILS 2 (TWO) TIMES DAILY., Disp: , Rfl: 5 .  Eluxadoline (VIBERZI) 100 MG TABS, Take 1 tablet by mouth 2 (two) times daily. , Disp: , Rfl:  .  Fluticasone-Umeclidin-Vilant (TRELEGY ELLIPTA) 100-62.5-25 MCG/INH AEPB, Inhale 1 puff into the lungs daily., Disp: 28 each, Rfl: 2 .  furosemide (LASIX) 40 MG tablet, TAKE 1 TABLET TWICE A DAY FOR FLUID RETENTION AND SWELLING OF LEGS, Disp: 60 tablet, Rfl: 0 .  Galcanezumab-gnlm (EMGALITY) 120 MG/ML SOAJ, Inject into the skin. Pt receives this monthly ingection, Disp: , Rfl:  .  GLUCOSAMINE-CHONDROITIN PO, Take 3 tablets by mouth daily., Disp: , Rfl:  .  guaiFENesin (MUCINEX) 600 MG 12 hr tablet, Take 600 mg by mouth 2 (two) times daily as needed for cough or to loosen phlegm. Instructed to take 3 tablets in the morning, Disp: , Rfl:  .  HYDROcodone-homatropine (HYCODAN) 5-1.5 MG/5ML syrup, Take 5 mLs by mouth every 6 (six) hours as needed for cough. Do not take with other opioids such as tramadol., Disp: 120 mL, Rfl: 0 .  LUTEIN PO, Take 1 tablet by mouth 2 (two) times daily., Disp: , Rfl:  .  LYRICA 150 MG capsule, Take 300 mg by mouth 2 (two) times daily., Disp: , Rfl: 1 .  memantine (NAMENDA) 10 MG tablet, Take 10 mg by mouth 2 (two) times daily. , Disp: , Rfl:  .   Methylfol-Algae-B12-Acetylcyst (CEREFOLIN NAC) 6-90.314-2-600 MG TABS, Take 1 tablet by mouth daily. , Disp: , Rfl:  .  montelukast (SINGULAIR) 10 MG tablet, TAKE 1 TABLET BY MOUTH EVERY DAY, Disp: 90 tablet, Rfl: 1 .  Multiple Vitamins-Minerals (MULTIVITAMIN WITH MINERALS) tablet, Take 1 tablet by mouth daily., Disp: , Rfl:  .  ondansetron (ZOFRAN-ODT) 4 MG disintegrating tablet, TAKE 1 TABLET (4 MG TOTAL) BY MOUTH EVERY 6 (SIX) HOURS AS NEEDED FOR NAUSEA OR VOMITING., Disp: 30 tablet, Rfl: 0 .  oxyCODONE-acetaminophen (PERCOCET) 10-325 MG tablet, Take 1 tablet by mouth every 4 (four) hours as needed for pain., Disp: 60 tablet, Rfl: 0 .  oxymetazoline (AFRIN) 0.05 % nasal spray, Place 1 spray into both nostrils See admin instructions. 1 SPRAY PER SIDE EACH NIGHT BEFORE DYMISTA, Disp: , Rfl:  .  pentosan polysulfate (ELMIRON) 100 MG capsule, Take 100 mg by mouth 3 (three) times daily before meals. , Disp: , Rfl:  .  Phosphatidylserine-DHA-EPA (VAYACOG PO), Take 1 capsule by mouth 2 (two) times daily., Disp: , Rfl:  .  Potassium 99 MG TABS, Take 1 tablet by mouth daily., Disp: , Rfl:  .  pravastatin (PRAVACHOL) 40 MG tablet, TAKE 1 TABLET BY MOUTH AT BEDTIME, Disp: 90 tablet, Rfl: 1 .  PRESCRIPTION MEDICATION, Pt receives weekly allergy shots, Disp: , Rfl:  .  Probiotic Product (VSL#3 DS) PACK, Take 2 each by mouth daily., Disp: , Rfl:  .  ranitidine (ZANTAC) 150 MG capsule, Take 1 capsule (150 mg total) by mouth 2 (two) times daily., Disp: 30 capsule, Rfl: 5 .  sodium chloride (OCEAN) 0.65 % SOLN nasal spray, Place 1 spray into both nostrils 4 (four) times daily as needed for congestion. Uses each time before other nasal sprays, Disp: , Rfl:  .  tadalafil (CIALIS) 5 MG tablet, Take 5 mg by mouth every evening. , Disp: , Rfl:  .  traMADol (ULTRAM) 50 MG tablet, TAKE 1 TABLET EVERY 6 HOURS AS NEEDED FOR COUGH, Disp: 60 tablet, Rfl: 0 .  triamcinolone cream (KENALOG) 0.1 %, APPLY TOPICALLY 4 TIMES A DAY  (Patient taking differently: APPLY TOPICALLY 2 TIMES A DAY AS NEEDED for itching), Disp: 30 g, Rfl: 1 .  trospium (SANCTURA) 20 MG tablet, Take 20 mg by mouth 2 (two) times daily. , Disp: , Rfl: 2 .  TURMERIC PO, Take 3 capsules by mouth daily., Disp: , Rfl:   Current Facility-Administered Medications:  [name]  Mepolizumab SOLR 100 mg, 100 mg, Subcutaneous, Q28 days, [name], MD, 100 mg at 05/11/17 1546  Past Medical History: Past Medical History:  Diagnosis Date  . Anesthesia complication requiring reversal agent administration    ? from central apnea, very difficult to get off vent  .  Anxiety   . Arthritis    osteo  . Asthma   . BPH (benign prostatic hyperplasia)   . Complication of anesthesia    difficulty waking , they twlight me because of my respiratory problems "  . Dyspnea   . Enlarged heart   . Family history of adverse reaction to anesthesia    mother trouble waking up, and heart stopped  . GERD (gastroesophageal reflux disease)   . Headache    botox injections for headaches  . Hyperlipidemia   . Hypertension   . Hypogonadism male   . IBS (irritable bowel syndrome)   . Obesity   . OSA (obstructive sleep apnea)    cpap  . Pneumonia   . Pre-diabetes   . Prostatitis     Tobacco Use: Social History   Tobacco Use  Smoking Status Former Smoker  . Packs/day: 0.10  . Years: 15.00  . Pack years: 1.50  Smokeless Tobacco Never Used  Tobacco Comment   significant second-hand exposure through mother    Labs: Recent Review Flowsheet Data    Labs for ITP Cardiac and Pulmonary Rehab Latest Ref Rng & Units 01/08/2017 02/15/2017 04/14/2017 07/20/2017 11/25/2017   Cholestrol <200 mg/dL 110 - 122 137 114   LDLCALC mg/dL (calc) 54 - 64 64 51   HDL >40 mg/dL 33(L) - 26(L) 48 31(L)   Trlycerides <150 mg/dL 147 - 308(H) 169(H) 275(H)   Hemoglobin A1c <5.7 % of total Hgb - - 6.1(H) 6.6(H) 5.9(H)   PHART 7.350 - 7.450 - - - - -   PCO2ART 32.0 - 48.0 mmHg - - - - -    HCO3 20.0 - 28.0 mmol/L - 32.0(H) - - -   TCO2 22 - 32 mmol/L - 34(H) - - -   O2SAT % - 80.0 - - -      Capillary Blood Glucose: Lab Results  Component Value Date   GLUCAP 116 (H) 02/18/2017   GLUCAP 96 02/18/2017   GLUCAP 135 (H) 02/17/2017   GLUCAP 192 (H) 02/17/2017   GLUCAP 191 (H) 02/17/2017     Pulmonary Assessment Scores: Pulmonary Assessment Scores    Row Name 10/22/17 0902 11/03/17 1156       ADL UCSD   ADL Phase  Entry  Entry    SOB Score total  -  73      CAT Score   CAT Score  -  35      mMRC Score   mMRC Score  4  -       Pulmonary Function Assessment:   Exercise Target Goals:    Exercise Program Goal: Individual exercise prescription set using results from initial 6 min walk test and THRR while considering  patient's activity barriers and safety.    Exercise Prescription Goal: Initial exercise prescription builds to 30-45 minutes a day of aerobic activity, 2-3 days per week.  Home exercise guidelines will be given to patient during program as part of exercise prescription that the participant will acknowledge.  Activity Barriers & Risk Stratification: Activity Barriers & Cardiac Risk Stratification - 10/16/17 1458      Activity Barriers & Cardiac Risk Stratification   Activity Barriers  Back Problems;Balance Concerns;Deconditioning;Arthritis;Shortness of Breath;Assistive Device multiple of orthopedic stressors    Cardiac Risk Stratification  High       6 Minute Walk: 6 Minute Walk    Row Name 10/22/17 0852         6 Minute Walk   Phase  Initial     Distance  600 feet     Walk Time  6 minutes     # of Rest Breaks  0     MPH  1.13     METS  1.84     RPE  13     Perceived Dyspnea   1     Symptoms  Yes (comment)     Comments  7/10 BACK PAIN     Resting HR  96 bpm     Resting BP  160/88     Resting Oxygen Saturation   89 %     Exercise Oxygen Saturation  during 6 min walk  87 %     Max Ex. HR  117 bpm     Max Ex. BP  140/94        Interval HR   1 Minute HR  97     2 Minute HR  107     3 Minute HR  106     4 Minute HR  111     5 Minute HR  117     6 Minute HR  108     2 Minute Post HR  95     Interval Heart Rate?  Yes       Interval Oxygen   Interval Oxygen?  Yes     Baseline Oxygen Saturation %  89 %     1 Minute Oxygen Saturation %  89 %     1 Minute Liters of Oxygen  0 L     2 Minute Oxygen Saturation %  87 %     2 Minute Liters of Oxygen  0 L     3 Minute Oxygen Saturation %  89 %     3 Minute Liters of Oxygen  2 L     4 Minute Oxygen Saturation %  91 %     4 Minute Liters of Oxygen  2 L     5 Minute Oxygen Saturation %  90 %     5 Minute Liters of Oxygen  2 L     6 Minute Oxygen Saturation %  91 %     6 Minute Liters of Oxygen  2 L     2 Minute Post Oxygen Saturation %  95 %     2 Minute Post Liters of Oxygen  2 L        Oxygen Initial Assessment: Oxygen Initial Assessment - 10/22/17 0851      Initial 6 min Walk   Oxygen Used  Continuous;E-Tanks    Liters per minute  2      Program Oxygen Prescription   Program Oxygen Prescription  Continuous;E-Tanks    Liters per minute  2       Oxygen Re-Evaluation: Oxygen Re-Evaluation    Row Name 10/27/17 0715 11/20/17 1507 12/14/17 1243         Program Oxygen Prescription   Program Oxygen Prescription  Continuous;E-Tanks  Continuous;E-Tanks  Continuous;E-Tanks     Liters per minute  '2  2  2       '$ Home Oxygen   Home Oxygen Device  E-Tanks  E-Tanks  E-Tanks     Sleep Oxygen Prescription  None  None  None     Home Exercise Oxygen Prescription  Continuous  Continuous  Continuous     Liters per minute  0.'5  2  2     '$ Home at Rest Exercise Oxygen Prescription  None  None  None     Compliance with Home Oxygen Use  Yes  Yes  Yes       Goals/Expected Outcomes   Short Term Goals  To learn and exhibit compliance with exercise, home and travel O2 prescription;To learn and understand importance of maintaining oxygen saturations>88%;To learn and  demonstrate proper use of respiratory medications;To learn and understand importance of monitoring SPO2 with pulse oximeter and demonstrate accurate use of the pulse oximeter.;To learn and demonstrate proper pursed lip breathing techniques or other breathing techniques.  To learn and exhibit compliance with exercise, home and travel O2 prescription;To learn and understand importance of maintaining oxygen saturations>88%;To learn and demonstrate proper use of respiratory medications;To learn and understand importance of monitoring SPO2 with pulse oximeter and demonstrate accurate use of the pulse oximeter.;To learn and demonstrate proper pursed lip breathing techniques or other breathing techniques.  To learn and exhibit compliance with exercise, home and travel O2 prescription;To learn and understand importance of maintaining oxygen saturations>88%;To learn and demonstrate proper use of respiratory medications;To learn and understand importance of monitoring SPO2 with pulse oximeter and demonstrate accurate use of the pulse oximeter.;To learn and demonstrate proper pursed lip breathing techniques or other breathing techniques.     Long  Term Goals  Exhibits compliance with exercise, home and travel O2 prescription;Verbalizes importance of monitoring SPO2 with pulse oximeter and return demonstration;Maintenance of O2 saturations>88%;Exhibits proper breathing techniques, such as pursed lip breathing or other method taught during program session;Compliance with respiratory medication;Demonstrates proper use of MDI's  Exhibits compliance with exercise, home and travel O2 prescription;Verbalizes importance of monitoring SPO2 with pulse oximeter and return demonstration;Maintenance of O2 saturations>88%;Exhibits proper breathing techniques, such as pursed lip breathing or other method taught during program session;Compliance with respiratory medication;Demonstrates proper use of MDI's  Exhibits compliance with  exercise, home and travel O2 prescription;Verbalizes importance of monitoring SPO2 with pulse oximeter and return demonstration;Maintenance of O2 saturations>88%;Exhibits proper breathing techniques, such as pursed lip breathing or other method taught during program session;Compliance with respiratory medication;Demonstrates proper use of MDI's     Goals/Expected Outcomes  compliance  compliance  compliance        Oxygen Discharge (Final Oxygen Re-Evaluation): Oxygen Re-Evaluation - 12/14/17 1243      Program Oxygen Prescription   Program Oxygen Prescription  Continuous;E-Tanks    Liters per minute  2      Home Oxygen   Home Oxygen Device  E-Tanks    Sleep Oxygen Prescription  None    Home Exercise Oxygen Prescription  Continuous    Liters per minute  2    Home at Rest Exercise Oxygen Prescription  None    Compliance with Home Oxygen Use  Yes      Goals/Expected Outcomes   Short Term Goals  To learn and exhibit compliance with exercise, home and travel O2 prescription;To learn and understand importance of maintaining oxygen saturations>88%;To learn and demonstrate proper use of respiratory medications;To learn and understand importance of monitoring SPO2 with pulse oximeter and demonstrate accurate use of the pulse oximeter.;To learn and demonstrate proper pursed lip breathing techniques or other breathing techniques.    Long  Term Goals  Exhibits compliance with exercise, home and travel O2 prescription;Verbalizes importance of monitoring SPO2 with pulse oximeter and return demonstration;Maintenance of O2 saturations>88%;Exhibits proper breathing techniques, such as pursed lip breathing or other method taught during program session;Compliance with respiratory medication;Demonstrates proper use of MDI's    Goals/Expected Outcomes  compliance  Initial Exercise Prescription: Initial Exercise Prescription - 10/22/17 0800      Date of Initial Exercise RX and Referring Provider    Date  10/20/17    Referring Provider  Dr. [name]      Oxygen   Oxygen  Continuous    Liters  2      NuStep   Level  1    SPM  80    Minutes  34    METs  1.5      Track   Laps  5    Minutes  17      Prescription Details   Frequency (times per week)  2    Duration  Progress to 45 minutes of aerobic exercise without signs/symptoms of physical distress      Intensity   THRR 40-80% of Max Heartrate  66-132    Ratings of Perceived Exertion  11-13    Perceived Dyspnea  0-4      Progression   Progression  Continue progressive overload as per policy without signs/symptoms or physical distress.      Resistance Training   Training Prescription  Yes    Weight  blue bands    Reps  10-15       Perform Capillary Blood Glucose checks as needed.  Exercise Prescription Changes:  Exercise Prescription Changes    Row Name 10/29/17 1448 12/01/17 1600 12/08/17 1417         Response to Exercise   Blood Pressure (Admit)  140/60  114/60  110/80     Blood Pressure (Exercise)  122/60  122/68  120/80     Blood Pressure (Exit)  112/70  118/68  108/80     Heart Rate (Admit)  90 bpm  98 bpm  101 bpm     Heart Rate (Exercise)  102 bpm  107 bpm  102 bpm     Heart Rate (Exit)  89 bpm  97 bpm  104 bpm     Oxygen Saturation (Admit)  88 %  94 %  94 %     Oxygen Saturation (Exercise)  91 %  92 %  93 %     Oxygen Saturation (Exit)  93 %  97 %  94 %     Rating of Perceived Exertion (Exercise)  '19  13  16     '$ Perceived Dyspnea (Exercise)  '3  2  3     '$ Duration  Progress to 45 minutes of aerobic exercise without signs/symptoms of physical distress  Progress to 45 minutes of aerobic exercise without signs/symptoms of physical distress  Progress to 45 minutes of aerobic exercise without signs/symptoms of physical distress     Intensity  - 40-80% HRR  THRR unchanged  THRR unchanged       Progression   Progression  -  -  Continue to progress workloads to maintain intensity without signs/symptoms of  physical distress.       Resistance Training   Training Prescription  Yes  Yes  Yes     Weight  blue bands  blue bands  - blue bands     Reps  10-15  10-15  10-15     Time  10 Minutes  10 Minutes  10 Minutes       Interval Training   Interval Training  No  No  No       Oxygen   Oxygen  Continuous  Continuous  Continuous     Liters  2  2  2       NuStep   Level  '1  1  1     '$ SPM  80  80  80     Minutes  17  51  51     METs  1.9  2.5  2.1       Track   Laps  7  -  -     Minutes  17  -  -        Exercise Comments:   Exercise Goals and Review:  Exercise Goals    Row Name 10/16/17 1459             Exercise Goals   Increase Physical Activity  Yes       Intervention  Provide advice, education, support and counseling about physical activity/exercise needs.;Develop an individualized exercise prescription for aerobic and resistive training based on initial evaluation findings, risk stratification, comorbidities and participant's personal goals.       Expected Outcomes  Short Term: Attend rehab on a regular basis to increase amount of physical activity.;Long Term: Add in home exercise to make exercise part of routine and to increase amount of physical activity.;Long Term: Exercising regularly at least 3-5 days a week.       Increase Strength and Stamina  Yes       Intervention  Provide advice, education, support and counseling about physical activity/exercise needs.;Develop an individualized exercise prescription for aerobic and resistive training based on initial evaluation findings, risk stratification, comorbidities and participant's personal goals.       Expected Outcomes  Short Term: Increase workloads from initial exercise prescription for resistance, speed, and METs.;Short Term: Perform resistance training exercises routinely during rehab and add in resistance training at home;Long Term: Improve cardiorespiratory fitness, muscular endurance and strength as measured by  increased METs and functional capacity (6MWT)       Able to understand and use rate of perceived exertion (RPE) scale  Yes       Intervention  Provide education and explanation on how to use RPE scale       Expected Outcomes  Short Term: Able to use RPE daily in rehab to express subjective intensity level;Long Term:  Able to use RPE to guide intensity level when exercising independently       Able to understand and use Dyspnea scale  Yes       Intervention  Provide education and explanation on how to use Dyspnea scale       Expected Outcomes  Short Term: Able to use Dyspnea scale daily in rehab to express subjective sense of shortness of breath during exertion;Long Term: Able to use Dyspnea scale to guide intensity level when exercising independently       Intervention  Provide education and explanation of THRR including how the numbers were predicted and where they are located for reference       Expected Outcomes  Long Term: Able to use THRR to govern intensity when exercising independently;Short Term: Able to state/look up THRR;Short Term: Able to use daily as guideline for intensity in rehab       Understanding of Exercise Prescription  Yes       Intervention  Provide education, explanation, and written materials on patient's individual exercise prescription       Expected Outcomes  Short Term: Able to explain program exercise prescription;Long Term: Able to explain home exercise prescription to exercise independently  Exercise Goals Re-Evaluation : Exercise Goals Re-Evaluation    Row Name 10/27/17 0716 11/20/17 1508 12/14/17 1244         Exercise Goal Re-Evaluation   Exercise Goals Review  Increase Strength and Stamina;Able to understand and use Dyspnea scale;Increase Physical Activity;Able to understand and use rate of perceived exertion (RPE) scale;Knowledge and understanding of Target Heart Rate Range (THRR);Understanding of Exercise Prescription  Increase Strength and  Stamina;Able to understand and use Dyspnea scale;Increase Physical Activity;Able to understand and use rate of perceived exertion (RPE) scale;Knowledge and understanding of Target Heart Rate Range (THRR);Understanding of Exercise Prescription  Increase Strength and Stamina;Able to understand and use Dyspnea scale;Increase Physical Activity;Able to understand and use rate of perceived exertion (RPE) scale;Knowledge and understanding of Target Heart Rate Range (THRR);Understanding of Exercise Prescription     Comments  The patient is expected to start his first day of rehab today (10/27/17). Will cont to monitor and progress as able.   Patient has only attended two rehab sessions. Has not been to exercise since 10/29/17. Patient has been out due to numerous health issues. States he will be back next week. Will progress and motivate as able.  Patient has been out due to multiple health issues. Is only doing seated exercises due to back pain. Patient reported chest pain the last time he was at rehab. Has cards appointment 12/15/17. When patient returns, I will monitor and motivate as much as able.      Expected Outcomes  Through exercise at rehab and at home, the patient will decrease shortness of breath with daily activities and feel confident in carrying out an exercise regime at home.   Through exercise at rehab and at home, the patient will decrease shortness of breath with daily activities and feel confident in carrying out an exercise regime at home.   Through exercise at rehab and at home, the patient will decrease shortness of breath with daily activities and feel confident in carrying out an exercise regime at home.         Discharge Exercise Prescription (Final Exercise Prescription Changes): Exercise Prescription Changes - 12/08/17 1417      Response to Exercise   Blood Pressure (Admit)  110/80    Blood Pressure (Exercise)  120/80    Blood Pressure (Exit)  108/80    Heart Rate (Admit)  101 bpm     Heart Rate (Exercise)  102 bpm    Heart Rate (Exit)  104 bpm    Oxygen Saturation (Admit)  94 %    Oxygen Saturation (Exercise)  93 %    Oxygen Saturation (Exit)  94 %    Rating of Perceived Exertion (Exercise)  16    Perceived Dyspnea (Exercise)  3    Duration  Progress to 45 minutes of aerobic exercise without signs/symptoms of physical distress    Intensity  THRR unchanged      Progression   Progression  Continue to progress workloads to maintain intensity without signs/symptoms of physical distress.      Resistance Training   Training Prescription  Yes    Weight  -- blue bands    Reps  10-15    Time  10 Minutes      Interval Training   Interval Training  No      Oxygen   Oxygen  Continuous    Liters  2      NuStep   Level  1    SPM  80    Minutes  51    METs  2.1       Nutrition:  Target Goals: Understanding of nutrition guidelines, daily intake of sodium '1500mg'$ , cholesterol '200mg'$ , calories 30% from fat and 7% or less from saturated fats, daily to have 5 or more servings of fruits and vegetables.  Biometrics:    Nutrition Therapy Plan and Nutrition Goals: Nutrition Therapy & Goals - 12/08/17 1530      Nutrition Therapy   Diet  carb modified      Personal Nutrition Goals   Nutrition Goal  Identify food quantities necessary to achieve wt loss of  -2# per week to a goal wt loss of 6-24 lb at graduation from pulmonary rehab.    Personal Goal #2  Pt able to name foods that affect blood glucose       Intervention Plan   Intervention  Prescribe, educate and counsel regarding individualized specific dietary modifications aiming towards targeted core components such as weight, hypertension, lipid management, diabetes, heart failure and other comorbidities.    Expected Outcomes  Short Term Goal: Understand basic principles of dietary content, such as calories, fat, sodium, cholesterol and nutrients.       Nutrition Assessments: Nutrition Assessments - 11/11/17  1836      Rate Your Plate Scores   Pre Score  52       Nutrition Goals Re-Evaluation: Nutrition Goals Re-Evaluation    [place] Name 12/08/17 1531             Goals   Nutrition Goal  Identify food quantities necessary to achieve wt loss of  -2# per week to a goal wt loss of 6-24 lb at graduation from pulmonary rehab.          Nutrition Goals Discharge (Final Nutrition Goals Re-Evaluation): Nutrition Goals Re-Evaluation - 12/08/17 1531      Goals   Nutrition Goal  Identify food quantities necessary to achieve wt loss of  -2# per week to a goal wt loss of 6-24 lb at graduation from pulmonary rehab.       Psychosocial: Target Goals: Acknowledge presence or absence of significant depression and/or stress, maximize coping skills, provide positive support system. Participant is able to verbalize types and ability to use techniques and skills needed for reducing stress and depression.  Initial Review & Psychosocial Screening: Initial Psych Review & Screening - 10/16/17 1450      Initial Review   Current issues with  Current Stress Concerns;Current Depression;Current Anxiety/Panic;Current Sleep Concerns    Source of Stress Concerns  Chronic Illness;Family;Unable to participate in former interests or hobbies;Unable to perform yard/household activities    Comments  Pt living with his uncle. Pt feels he can not live alone due to his health issues.  Goal of year to return to him.      Barriers   Psychosocial barriers to participate in program  The patient should benefit from training in stress management and relaxation.      Screening Interventions   Interventions  Encouraged to exercise    Expected Outcomes  Long Term Goal: Stressors or current issues are controlled or eliminated.;Short Term goal: Identification and review with participant of any Quality of Life or Depression concerns found by scoring the questionnaire.       Quality of Life Scores:  Scores of 19 and below usually  indicate a poorer quality of life in these areas.  A difference of  2-3 points is a clinically meaningful difference.  A difference of 2-3 points in  the total score of the Quality of Life Index has been associated with significant improvement in overall quality of life, self-image, physical symptoms, and general health in studies assessing change in quality of life.   PHQ-9: Recent Review Flowsheet Data    Depression screen [facility] 2/9 10/16/2017 04/14/2017 10/04/2016 04/23/2016 08/13/2015   Decreased Interest 0 0 0 0 0   Down, Depressed, Hopeless 2 0 0 0 0   PHQ - 2 Score 2 0 0 0 0   Altered sleeping 3 - - - -   Tired, decreased energy 3 - - - -   Change in appetite 2 - - - -   Feeling bad or failure about yourself  1 - - - -   Trouble concentrating 2 - - - -   Moving slowly or fidgety/restless 1 - - - -   Suicidal thoughts 0 - - - -   PHQ-9 Score 14 - - - -   Difficult doing work/chores Somewhat difficult - - - -     Interpretation of Total Score  Total Score Depression Severity:  1-4 = Minimal depression, 5-9 = Mild depression, 10-14 = Moderate depression, 15-19 = Moderately severe depression, 20-27 = Severe depression   Psychosocial Evaluation and Intervention: Psychosocial Evaluation - 12/15/17 0849      Psychosocial Evaluation & Interventions   Interventions  Stress management education;Relaxation education;Encouraged to exercise with the program and follow exercise prescription    Comments  Pt has stress as it relates to the inability to participate in hobbies and activities of daily living.  This remains a source of stress for him since he has not been able to engage in consistent exercise at Pulmonary Rehab and home exercise.    Expected Outcomes  Pt will demonstrate positive and healthy coping skills with positive outlook on his life and future.    Continue Psychosocial Services   Follow up required by staff       Psychosocial Re-Evaluation: Psychosocial Re-Evaluation    [place]  Name 11/24/17 1054 12/15/17 0853           Psychosocial Re-Evaluation   Current issues with  Current Depression;Current Sleep Concerns;Current Stress Concerns;Current Anxiety/Panic  Current Depression;Current Sleep Concerns;Current Stress Concerns;Current Anxiety/Panic      Comments  not participating d/t lower back pain/sciatica  on hold due to cp and await clearance from primary md to resume       Expected Outcomes  no barriers to participation in pulmonary rehab  Pt will develop positive and healthy coping skills to       Interventions  Physician referral return to program when released from MD  Physician referral      Continue Psychosocial Services   Follow up required by staff  Follow up required by staff      Comments  recovering from back issue  recovering from back issue and follow up for er visit for cp        Initial Review   Source of Stress Concerns  Chronic Illness;Unable to participate in former interests or hobbies;Unable to perform yard/household activities;Family  Chronic Illness;Unable to participate in former interests or hobbies;Unable to perform yard/household activities;Family         Psychosocial Discharge (Final Psychosocial Re-Evaluation): Psychosocial Re-Evaluation - 12/15/17 0853      Psychosocial Re-Evaluation   Current issues with  Current Depression;Current Sleep Concerns;Current Stress Concerns;Current Anxiety/Panic    Comments  on hold due to cp and await clearance from primary md to  resume     Expected Outcomes  Pt will develop positive and healthy coping skills to     Interventions  Physician referral    Continue Psychosocial Services   Follow up required by staff    Comments  recovering from back issue and follow up for er visit for cp      Initial Review   Source of Stress Concerns  Chronic Illness;Unable to participate in former interests or hobbies;Unable to perform yard/household activities;Family       Education: Education Goals: Education  classes will be provided on a weekly basis, covering required topics. Participant will state understanding/return demonstration of topics presented.  Learning Barriers/Preferences: Learning Barriers/Preferences - 10/16/17 1456      Learning Barriers/Preferences   Learning Barriers  Sight;Hearing;Exercise Concerns    Learning Preferences  Verbal Instruction;Written Material;Individual Instruction;Group Instruction;Computer/Internet       Education Topics: Risk Factor Reduction:  -Group instruction that is supported by a PowerPoint presentation. Instructor discusses the definition of a risk factor, different risk factors for pulmonary disease, and how the heart and lungs work together.     Nutrition for Pulmonary Patient:  -Group instruction provided by PowerPoint slides, verbal discussion, and written materials to support subject matter. The instructor gives an explanation and review of healthy diet recommendations, which includes a discussion on weight management, recommendations for fruit and vegetable consumption, as well as protein, fluid, caffeine, fiber, sodium, sugar, and alcohol. Tips for eating when patients are short of breath are discussed.   Pursed Lip Breathing:  -Group instruction that is supported by demonstration and informational handouts. Instructor discusses the benefits of pursed lip and diaphragmatic breathing and detailed demonstration on how to preform both.     Oxygen Safety:  -Group instruction provided by PowerPoint, verbal discussion, and written material to support subject matter. There is an overview of "What is Oxygen" and "Why do we need it".  Instructor also reviews how to create a safe environment for oxygen use, the importance of using oxygen as prescribed, and the risks of noncompliance. There is a brief discussion on traveling with oxygen and resources the patient may utilize.   CARDIAC REHAB PHASE II EXERCISE from 12/08/2017 in [place]  Date  12/10/17  Educator  Cloyde Reams  Instruction Review Code  1- Verbalizes Understanding      Oxygen Equipment:  -Group instruction provided by [employer] utilizing handouts, written materials, and Insurance underwriter.   Signs and Symptoms:  -Group instruction provided by written material and verbal discussion to support subject matter. Warning signs and symptoms of infection, stroke, and heart attack are reviewed and when to call the physician/911 reinforced. Tips for preventing the spread of infection discussed.   CARDIAC REHAB PHASE II EXERCISE from 12/08/2017 in [place]  Date  12/03/17  Educator  Remo Lipps  Instruction Review Code  1- Verbalizes Understanding      Advanced Directives:  -Group instruction provided by verbal instruction and written material to support subject matter. Instructor reviews Advanced Directive laws and proper instruction for filling out document.   Pulmonary Video:  -Group video education that reviews the importance of medication and oxygen compliance, exercise, good nutrition, pulmonary hygiene, and pursed lip and diaphragmatic breathing for the pulmonary patient.   Exercise for the Pulmonary Patient:  -Group instruction that is supported by a PowerPoint presentation. Instructor discusses benefits of exercise, core components of exercise, frequency, duration, and intensity of an exercise routine, importance  of utilizing pulse oximetry during exercise, safety while exercising, and options of places to exercise outside of rehab.     Pulmonary Medications:  -Verbally interactive group education provided by instructor with focus on inhaled medications and proper administration.   Anatomy and Physiology of the Respiratory System and Intimacy:  -Group instruction provided by PowerPoint, verbal discussion, and written material to support subject matter. Instructor reviews respiratory cycle and anatomical  components of the respiratory system and their functions. Instructor also reviews differences in obstructive and restrictive respiratory diseases with examples of each. Intimacy, Sex, and Sexuality differences are reviewed with a discussion on how relationships can change when diagnosed with pulmonary disease. Common sexual concerns are reviewed.   MD DAY -A group question and answer session with a medical doctor that allows participants to ask questions that relate to their pulmonary disease state.   CARDIAC REHAB PHASE II EXERCISE from 12/03/2017 in [place]  Date  10/29/17  Educator  yacoub  Instruction Review Code  2- Demonstrated Understanding      OTHER EDUCATION -Group or individual verbal, written, or video instructions that support the educational goals of the pulmonary rehab program.   Holiday Eating Survival Tips:  -Group instruction provided by PowerPoint slides, verbal discussion, and written materials to support subject matter. The instructor gives patients tips, tricks, and techniques to help them not only survive but enjoy the holidays despite the onslaught of food that accompanies the holidays.   Knowledge Questionnaire Score: Knowledge Questionnaire Score - 11/03/17 1156      Knowledge Questionnaire Score   Pre Score  18/18       Core Components/Risk Factors/Patient Goals at Admission: Personal Goals and Risk Factors at Admission - 10/16/17 1446      Core Components/Risk Factors/Patient Goals on Admission    Weight Management  Obesity;Yes    Intervention  Weight Management: Develop a combined nutrition and exercise program designed to reach desired caloric intake, while maintaining appropriate intake of nutrient and fiber, sodium and fats, and appropriate energy expenditure required for the weight goal.;Weight Management: Provide education and appropriate resources to help participant work on and attain dietary goals.;Weight  Management/Obesity: Establish reasonable short term and long term weight goals.;Obesity: Provide education and appropriate resources to help participant work on and attain dietary goals.    Goal Weight: Long Term  200 lb (90.7 kg)    Expected Outcomes  Short Term: Continue to assess and modify interventions until short term weight is achieved;Weight Maintenance: Understanding of the daily nutrition guidelines, which includes 25-35% calories from fat, 7% or less cal from saturated fats, less than '200mg'$  cholesterol, less than 1.5gm of sodium, & 5 or more servings of fruits and vegetables daily;Weight Loss: Understanding of general recommendations for a balanced deficit meal plan, which promotes 1-2 lb weight loss per week and includes a negative energy balance of [id number] kcal/d;Understanding recommendations for meals to include 15-35% energy as protein, 25-35% energy from fat, 35-60% energy from carbohydrates, less than '200mg'$  of dietary cholesterol, 20-35 gm of total fiber daily;Understanding of distribution of calorie intake throughout the day with the consumption of 4-5 meals/snacks    Improve shortness of breath with ADL's  Yes    Intervention  Provide education, individualized exercise plan and daily activity instruction to help decrease symptoms of SOB with activities of daily living.    Expected Outcomes  Short Term: Improve cardiorespiratory fitness to achieve a reduction of symptoms when performing ADLs;Long Term: Be able to  perform more ADLs without symptoms or delay the onset of symptoms    Hypertension  Yes    Intervention  Provide education on lifestyle modifcations including regular physical activity/exercise, weight management, moderate sodium restriction and increased consumption of fresh fruit, vegetables, and low fat dairy, alcohol moderation, and smoking cessation.;Monitor prescription use compliance.    Expected Outcomes  Short Term: Continued assessment and intervention until BP is <  140/56m HG in hypertensive participants. < 130/852mHG in hypertensive participants with diabetes, heart failure or chronic kidney disease.;Long Term: Maintenance of blood pressure at goal levels.    Lipids  Yes    Intervention  Provide education and support for participant on nutrition & aerobic/resistive exercise along with prescribed medications to achieve LDL '70mg'$ , HDL >'40mg'$ .    Expected Outcomes  Short Term: Participant states understanding of desired cholesterol values and is compliant with medications prescribed. Participant is following exercise prescription and nutrition guidelines.;Long Term: Cholesterol controlled with medications as prescribed, with individualized exercise RX and with personalized nutrition plan. Value goals: LDL < '70mg'$ , HDL > 40 mg.    Stress  Yes family stress, mother has dementia, father has bone marrow disease, sister is on drugs    Intervention  Offer individual and/or small group education and counseling on adjustment to heart disease, stress management and health-related lifestyle change. Teach and support self-help strategies.    Expected Outcomes  Short Term: Participant demonstrates changes in health-related behavior, relaxation and other stress management skills, ability to obtain effective social support, and compliance with psychotropic medications if prescribed.       Core Components/Risk Factors/Patient Goals Review:  Goals and Risk Factor Review    [place] Name 10/27/17 1241 11/24/17 1051 12/15/17 0825         Core Components/Risk Factors/Patient Goals Review   Personal Goals Review  Weight Management/Obesity;Develop more efficient breathing techniques such as purse lipped breathing and diaphragmatic breathing and practicing self-pacing with activity.;Improve shortness of breath with ADL's;Lipids;Hypertension;Stress  Weight Management/Obesity;Improve shortness of breath with ADL's;Hypertension;Lipids;Stress  Weight Management/Obesity;Improve shortness of  breath with ADL's;Lipids;Stress     Review  Pt to begin exercise today.  Unable to assess at this time  has attended 2 exercise sessions, is being treated by Dr. McMelford Aaseor lower back pain/sciatica with prednisone, he has been absent since 10/31/17, unable to contact or leave a message  Attended 6 exercise sessions after receiving streriod injections by Dr. McMelford Aaseowever pt is on hold presently due to presenting to ER with cp. Unfortunately pt did not stay for this ER visit.  Scheduled pt for follow up with primary MD on 7/23. Pt weight shows decrease of 1.4 kg since begining Pulmonary Rehab.  Pt has not been able to engage in consistent exercise due to overall deconditioning  and lower back pain.  Pt  observed using PLB techniques but this is difficult due to his large abdomen area which decreases his lung capacity. Pt with good management of his bp and has the expected  increase on exertion. Will resolve this patient goal.  Lipids have elevation in triglycerides pt encouraged to work on this with diet.  will continue to montior this in the next 30 days for any new lab work.  Pt continue to have stress as it relates to his inability to fully engage in exercise.  Hopeful that pt will be cleared to return and on review for the next 30 days show some progress.      Expected Outcomes  See "admission outcomes"  See "admission outcomes"  See Admission Goals        Core Components/Risk Factors/Patient Goals at Discharge (Final Review):  Goals and Risk Factor Review - 12/15/17 0825      Core Components/Risk Factors/Patient Goals Review   Personal Goals Review  Weight Management/Obesity;Improve shortness of breath with ADL's;Lipids;Stress    Review  Attended 6 exercise sessions after receiving streriod injections by Dr. Melford Aase however pt is on hold presently due to presenting to ER with cp. Unfortunately pt did not stay for this ER visit.  Scheduled pt for follow up with primary MD on 7/23. Pt weight shows  decrease of 1.4 kg since begining Pulmonary Rehab.  Pt has not been able to engage in consistent exercise due to overall deconditioning  and lower back pain.  Pt  observed using PLB techniques but this is difficult due to his large abdomen area which decreases his lung capacity. Pt with good management of his bp and has the expected  increase on exertion. Will resolve this patient goal.  Lipids have elevation in triglycerides pt encouraged to work on this with diet.  will continue to montior this in the next 30 days for any new lab work.  Pt continue to have stress as it relates to his inability to fully engage in exercise.  Hopeful that pt will be cleared to return and on review for the next 30 days show some progress.     Expected Outcomes  See Admission Goals       ITP Comments: ITP Comments    [place] Name 10/16/17 1417 10/28/17 1124 12/15/17 0825       ITP Comments  Dr. Jennet Maduro  Dr. Baxter Kail, Medical Director  Dr. Baxter Kail, Medical Director        Comments:  Pt completed 6 exercise sessions and one incomplete session due to er visit for cp and pt has not had a follow up. Cherre Huger, BSN Cardiac and Training and development officer

## 2017-12-16 DIAGNOSIS — G43709 Chronic migraine without aura, not intractable, without status migrainosus: Secondary | ICD-10-CM | POA: Diagnosis not present

## 2017-12-16 LAB — CBC WITH DIFFERENTIAL/PLATELET
BASOS ABS: 58 {cells}/uL (ref 0–200)
BASOS PCT: 0.8 %
EOS ABS: 281 {cells}/uL (ref 15–500)
Eosinophils Relative: 3.9 %
HEMATOCRIT: 46.7 % (ref 38.5–50.0)
HEMOGLOBIN: 15.8 g/dL (ref 13.2–17.1)
Lymphs Abs: 2750 cells/uL (ref 850–3900)
MCH: 28.7 pg (ref 27.0–33.0)
MCHC: 33.8 g/dL (ref 32.0–36.0)
MCV: 84.8 fL (ref 80.0–100.0)
MONOS PCT: 12.5 %
MPV: 11.3 fL (ref 7.5–12.5)
NEUTROS ABS: 3211 {cells}/uL (ref 1500–7800)
Neutrophils Relative %: 44.6 %
Platelets: 215 10*3/uL (ref 140–400)
RBC: 5.51 10*6/uL (ref 4.20–5.80)
RDW: 13.3 % (ref 11.0–15.0)
Total Lymphocyte: 38.2 %
WBC mixed population: 900 cells/uL (ref 200–950)
WBC: 7.2 10*3/uL (ref 3.8–10.8)

## 2017-12-16 LAB — COMPLETE METABOLIC PANEL WITH GFR
AG RATIO: 1.7 (calc) (ref 1.0–2.5)
ALBUMIN MSPROF: 3.7 g/dL (ref 3.6–5.1)
ALKALINE PHOSPHATASE (APISO): 68 U/L (ref 40–115)
ALT: 20 U/L (ref 9–46)
AST: 16 U/L (ref 10–35)
BILIRUBIN TOTAL: 1.1 mg/dL (ref 0.2–1.2)
BUN: 15 mg/dL (ref 7–25)
CHLORIDE: 103 mmol/L (ref 98–110)
CO2: 30 mmol/L (ref 20–32)
Calcium: 8.8 mg/dL (ref 8.6–10.3)
Creat: 0.9 mg/dL (ref 0.70–1.33)
GFR, EST AFRICAN AMERICAN: 109 mL/min/{1.73_m2} (ref >=60)
GFR, Est Non African American: 94 mL/min/{1.73_m2} (ref >=60)
Globulin: 2.2 g/dL (calc) (ref 1.9–3.7)
Glucose, Bld: 143 mg/dL — ABNORMAL HIGH (ref 65–99)
Potassium: 4 mmol/L (ref 3.5–5.3)
Sodium: 142 mmol/L (ref 135–146)
TOTAL PROTEIN: 5.9 g/dL — AB (ref 6.1–8.1)

## 2017-12-16 LAB — CK: Total CK: 57 U/L (ref 44–196)

## 2017-12-17 ENCOUNTER — Encounter: Payer: Self-pay | Admitting: Internal Medicine

## 2017-12-17 ENCOUNTER — Encounter (HOSPITAL_COMMUNITY)
Admission: RE | Admit: 2017-12-17 | Discharge: 2017-12-17 | Disposition: A | Discharge status: Home / Self Care | Payer: BLUE CROSS/BLUE SHIELD | Source: Ambulatory Visit

## 2017-12-18 ENCOUNTER — Other Ambulatory Visit: Payer: Self-pay | Admitting: Internal Medicine

## 2017-12-18 ENCOUNTER — Telehealth (HOSPITAL_COMMUNITY): Payer: Self-pay | Admitting: *Deleted

## 2017-12-18 ENCOUNTER — Ambulatory Visit: Payer: BLUE CROSS/BLUE SHIELD | Admitting: Internal Medicine

## 2017-12-18 ENCOUNTER — Encounter: Payer: Self-pay | Admitting: Internal Medicine

## 2017-12-18 VITALS — BP 134/80 | HR 112 | Temp 97.3°F | Resp 24 | Ht 72.5 in | Wt >= 6400 oz

## 2017-12-18 DIAGNOSIS — R079 Chest pain, unspecified: Secondary | ICD-10-CM

## 2017-12-18 DIAGNOSIS — G4733 Obstructive sleep apnea (adult) (pediatric): Secondary | ICD-10-CM | POA: Diagnosis not present

## 2017-12-18 DIAGNOSIS — I10 Essential (primary) hypertension: Secondary | ICD-10-CM

## 2017-12-18 DIAGNOSIS — Z79899 Other long term (current) drug therapy: Secondary | ICD-10-CM

## 2017-12-18 DIAGNOSIS — E119 Type 2 diabetes mellitus without complications: Secondary | ICD-10-CM

## 2017-12-18 DIAGNOSIS — I872 Venous insufficiency (chronic) (peripheral): Secondary | ICD-10-CM

## 2017-12-18 LAB — LEUKEMIA/LYMPHOMA EVALUATION PANEL
NUMBER OF MARKERS: 22
VIABILITY:: 88 %

## 2017-12-18 LAB — TROPONIN I
TROPONIN I: 0.05 ng/mL (ref <=0.0)
Troponin I: 0.01 ng/mL (ref <=0.0)

## 2017-12-18 NOTE — Telephone Encounter (Signed)
-----   Message from [name], MD sent at 12/18/2017  7:29 PM EDT ----- Regarding: RE: Ok to return to pulmonary rehab  [name],   - I have initiated a referral back to Dr [name] for consideration of a Persantine Myoview. Mark Harrell had a Negative/Normal Stress Cardiac echo last fall. CXR, EKG & troponin 0.0 - were OK at recent ER eval. and 2 days ago at OV his troponin was 0.05 and was repeated today at 0.01  - I think it is OK for him to continue his supervised exercise with you in the meantime. - Mark Harrell  ----- Message ----- From: Rowe Pavy, RN Sent: 12/18/2017   1:18 PM To: [name], MD Subject: Ok to return to pulmonary rehab                Dr. Melford Aase,  The mutual above pt is a participant in pulmonary rehab ( non telemetry) was seen by you on 7/23 in follow up from an ER visit for chest pain. Based upon your assessment may pt proceed with group exercise at pulmonary rehab?  We appreciate your advisement Maurice Small RN, BSN Cardiac and Pulmonary Rehab Nurse Navigator

## 2017-12-19 ENCOUNTER — Encounter: Payer: Self-pay | Admitting: Internal Medicine
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
## 2017-12-21 ENCOUNTER — Telehealth (HOSPITAL_COMMUNITY): Payer: Self-pay | Admitting: Internal Medicine

## 2017-12-22 ENCOUNTER — Encounter (HOSPITAL_COMMUNITY): Payer: BLUE CROSS/BLUE SHIELD

## 2017-12-23 ENCOUNTER — Ambulatory Visit: Payer: BLUE CROSS/BLUE SHIELD | Admitting: Internal Medicine

## 2017-12-23 VITALS — BP 124/80 | HR 92 | Temp 97.5°F | Resp 20 | Ht 72.5 in | Wt >= 6400 oz

## 2017-12-23 DIAGNOSIS — Z79899 Other long term (current) drug therapy: Secondary | ICD-10-CM | POA: Diagnosis not present

## 2017-12-23 DIAGNOSIS — I872 Venous insufficiency (chronic) (peripheral): Secondary | ICD-10-CM | POA: Diagnosis not present

## 2017-12-23 DIAGNOSIS — J0410 Acute tracheitis without obstruction: Secondary | ICD-10-CM | POA: Diagnosis not present

## 2017-12-23 DIAGNOSIS — I10 Essential (primary) hypertension: Secondary | ICD-10-CM

## 2017-12-23 MED ORDER — AZITHROMYCIN 250 MG PO TABS: ORAL_TABLET | ORAL | 1 refills | Status: DC

## 2017-12-23 MED ORDER — PREDNISONE 20 MG PO TABS: ORAL_TABLET | ORAL | 0 refills | Status: DC

## 2017-12-24 ENCOUNTER — Encounter (HOSPITAL_COMMUNITY): Payer: BLUE CROSS/BLUE SHIELD

## 2017-12-24 LAB — COMPLETE METABOLIC PANEL WITH GFR
AG Ratio: 1.6 (calc) (ref 1.0–2.5)
ALT: 31 U/L (ref 9–46)
AST: 21 U/L (ref 10–35)
Albumin: 3.7 g/dL (ref 3.6–5.1)
Alkaline phosphatase (APISO): 63 U/L (ref 40–115)
BUN: 19 mg/dL (ref 7–25)
CALCIUM: 8.9 mg/dL (ref 8.6–10.3)
CO2: 33 mmol/L — AB (ref 20–32)
CREATININE: 1.19 mg/dL (ref 0.70–1.33)
Chloride: 95 mmol/L — ABNORMAL LOW (ref 98–110)
GFR, Est African American: 78 mL/min/{1.73_m2} (ref >=60)
GFR, Est Non African American: 67 mL/min/{1.73_m2} (ref >=60)
GLUCOSE: 104 mg/dL — AB (ref 65–99)
Globulin: 2.3 g/dL (calc) (ref 1.9–3.7)
Potassium: 4 mmol/L (ref 3.5–5.3)
Sodium: 143 mmol/L (ref 135–146)
Total Bilirubin: 1.1 mg/dL (ref 0.2–1.2)
Total Protein: 6 g/dL — ABNORMAL LOW (ref 6.1–8.1)

## 2017-12-24 LAB — CBC WITH DIFFERENTIAL/PLATELET
Basophils Absolute: 67 cells/uL (ref 0–200)
Basophils Relative: 0.8 %
EOS PCT: 3.4 %
Eosinophils Absolute: 286 cells/uL (ref 15–500)
HCT: 48.5 % (ref 38.5–50.0)
Hemoglobin: 16 g/dL (ref 13.2–17.1)
LYMPHS ABS: 2596 {cells}/uL (ref 850–3900)
MCH: 28.7 pg (ref 27.0–33.0)
MCHC: 33 g/dL (ref 32.0–36.0)
MCV: 87.1 fL (ref 80.0–100.0)
MPV: 11.3 fL (ref 7.5–12.5)
Monocytes Relative: 11 %
Neutro Abs: 4528 cells/uL (ref 1500–7800)
Neutrophils Relative %: 53.9 %
Platelets: 215 10*3/uL (ref 140–400)
RBC: 5.57 10*6/uL (ref 4.20–5.80)
RDW: 13.4 % (ref 11.0–15.0)
Total Lymphocyte: 30.9 %
WBC mixed population: 924 cells/uL (ref 200–950)
WBC: 8.4 10*3/uL (ref 3.8–10.8)

## 2017-12-27 ENCOUNTER — Encounter: Payer: Self-pay | Admitting: Internal Medicine

## 2017-12-27 NOTE — Progress Notes (Signed)
Subjective:    Patient ID: Mark Harrell, male    DOB: [birth year], 57 y.o.   MRN: [id number]  HPI     This nice 57 yo single WM w/ severe Morbid Obesity (BMI 55+) is seen in close f/u of Chest pain and fluid overload recommending  Increasing his lasix to 3 tabs (40 mg) qam an he has lost 6#. He has ongoing c/o of dyspnea with normal activities and did have recent negative CXR and EKG at the ER. He is followed by Dr [name] And Eric Form for Allergic Asthma & COPD & is  attending pulmonary rehab. He does report recent exacerbation of productive cough with yellowish green sputum.      Other problems include TBI and SDAT and he is enamored  savoring  multiple treatments and is vunerable to overtreatment consequent of his exaggeration of sx's. He has mild Allergic Rhinitis. He is also treated for HTN, HLD, PreDM w/Insulin Resistance,   Medication Sig  . albuterol HFA inhaler 1 to 2 inhalations 10 to 15 minutes apart every 4 hrs to rescue Asthma  . albuterol  (2.5 MG/3ML  nebulizer soln Inhale   every 4hours as needed.  [name] UROXATRAL 10 MG 24 hr tablet Take 10 mg by mouth daily.    [name] LIORESAL 10 MG tablet Take 3  times daily as needed for muscle spasms.   . bisoprolol-hctz 5-6.25 MG tablet Take 1 tablet by mouth daily.   Calcium Carbonate - Vit D  Take 2  times daily.   . cetaphil  lotion Apply topically 2 (two) times daily.  . cetirizine  10 MG tablet Take 10 mg by mouth daily.  Amanda Cockayne 10-8 MG/5ML  Take 5 mLs every 12 (twelve) hours by mouth.  [name] VITAMIN D Take  20,000 in the morning and 10,000 in the evening  . LOTRISONE cream APPLY TO AFFECTED AREA TWICE A DAY  . co-enzyme Q-10 30 MG capsule Take 30 mg by mouth daily.  . cyclobenzaprine  10 MG tablet Take 10 mg by mouth 3 (three) times daily.   [name] desmopressin (DDAVP) 0.2 MG tablet Take 200 mcg by mouth daily.   . diclofenac75 MG EC tablet Take 1 tablet  2  times daily.  . diclofenac  1 % GEL APPLY 4 GRAMS TOPICALLY 4 TIMES DAILY  . ARICEPT 23  MG TABS Take 23 mg by mouth at bedtime.  . Dupilumab, Asthma, (DUPIXENT) 200 MG/1.14ML SOSY Inject into the skin. Injects bi-weekly  . DYMISTA 137-50 MCG/ACT SUSP PLACE 2 SPRAYS INTO BOTH NOSTRILS 2 (TWO) TIMES DAILY.  [name] VIBERZI 100 MG TABS Take 1 tablet by mouth 2 (two) times daily.   . TRELEGY ELLIPTA 100-62.5-25 Inhale 1 puff into the lungs daily.  . furosemide  40 MG tablet TAKE 1 TABLET TWICE A DAY   . EMGALITY 120 MG/ML  Inject into the skin. Pt receives this monthly injection  . GLUCOSAMINE-CHONDROITIN  Take 3 tablets by mouth daily.  [name] MUCINEX 600 MG 12 hr tablet Take 600 mg by mouth 2 (two) times daily as needed for cough or to loosen phlegm. Instructed to take 3 tablets in the morning  . HYCODAN5-1.5 MG/5ML syrp Take 5 mLs h every 6 hours as needed for cough. Do not take with other opioids such as tramadol.  . LUTEIN Take 1 tablet  2  times daily.  [name] LYRICA 150 MG capsule Take 300 mg  2  times daily.  [name] NAMENDA 10 MG tablet  Take 10 mg  2 (two) times daily.   . CEREFOLIN  Take 1 tablet daily.   . montelukast (SINGULAIR) 10 MG tablet TAKE 1 TABLET BY MOUTH EVERY DAY  . Multiple Vitamins-Minerals (MULTIVITAMIN WITH MINERALS) tablet Take 1 tablet by mouth daily.  [name] ZOFRAN-ODT 4 MG  TAKE 1 TABLET (4 MG TOTAL) BY MOUTH EVERY 6 (SIX) HOURS AS NEEDED FOR NAUSEA OR VOMITING.  [name] oxyCODONE-acetaminophen (PERCOCET) 10-325 MG tablet Take 1 tablet by mouth every 4 (four) hours as needed for pain.  . AFRI 0.05 % nasal spray Place 1 spray into both nostrils See admin instructions. 1 SPRAY PER SIDE EACH NIGHT BEFORE DYMISTA  . ELMIRON 100 MG capsule Take  3 times daily before meals.   . Phosphatidylserine-DHA-EPA (VAYACOG PO) Take 1 capsule  2  times daily.  . Potassium 99 MG TABS Take 1 tablet by mouth daily.  . pravastatin 40 MG tablet TAKE 1 TABLET BY MOUTH AT BEDTIME  .  Pt receives weekly allergy shots  . Probiotic Product (VSL#3 DS) PACK Take 2 each daily.  . ranitidine  150 MG capsule Take 1  capsule  2 times daily.  [name] OCEAN 0.65 % SOLN nasal spray Place 1 spray into  nostrils 4  times daily as needed for congestion. Uses each time before other nasal sprays  . tadalafil  5 MG tablet Take 5 mg  every evening.   . traMADol ( 50 MG tablet TAKE 1 TABLET EVERY 6 HRS AS NEEDED   . triamcinolone cream  0.1 % APPLY TOPICALLY 4 TIMES A DAY (Patient taking differently: APPLY TOPICALLY 2 TIMES A DAY AS NEEDED for itching)  . SANCTURA 20 MG tablet Take 20 mg b2  times daily.   . TURMERIC  Take 3 capsules by mouth daily.   Allergies  Allergen Reactions  . Bee Venom Swelling  . Ppd [Tuberculin Purified Protein Derivative] Other (See Comments)    +ppd NEG Quantferron Gold 3/13  . Fenofibrate Other (See Comments)    Back pain  . Levofloxacin Diarrhea  . Other Other (See Comments)    Some antibiotics cause diarrhea  . Verapamil Other (See Comments)    Back pain  . Claritin [Loratadine] Other (See Comments)    unknown   Review of Systems   10 point systems review negative except as above.    Objective:   Physical Exam  BP 124/80   Pulse 92   Temp (!) 97.5 F (36.4 C)   Resp 20   Ht 6' 0.5" (1.842 m)   Wt (!) 408 lb 12.8 oz (185.4 kg)   BMI 54.68 kg/m   HEENT - WNL. Neck - supple.  Chest - Scattered rales & Rhonchi clearing partially w/cough . No Wheezes Cor - Nl HS. RRR w/o sig MGR. PP 1(+). No edema. Abd - Massive redundant abdominal panniculus.  Soft w/o tenderness. MS- FROM w/o deformities.  Gait Nl. Neuro -  Nl w/o focal abnormalities.    Assessment & Plan:   1. Essential hypertension  - CBC with Differential/Platelet - COMPLETE METABOLIC PANEL WITH GFR  2. Edema of both lower extremities due to peripheral venous insufficiency  - CBC with Differential/Platelet - COMPLETE METABOLIC PANEL WITH GFR  3. Tracheitis  - predniSONE  20 MG tablet; 1 tab 3 x day for 3 days, then 1 tab 2 x day for 3 days, then 1 tab 1 x day for 5 days  Dispense: 20 tablet; Refill:  0  - azithromycin  250 MG tablet; Take 2 tablets (500 mg) on  Day 1,  followed by 1 tablet (250 mg) once daily on Days 2 through 5.  Dispense: 6 each; Refill: 1  4. Medication management  - CBC with Differential/Platelet - COMPLETE METABOLIC PANEL WITH GFR

## 2017-12-28 ENCOUNTER — Ambulatory Visit (INDEPENDENT_AMBULATORY_CARE_PROVIDER_SITE_OTHER): Payer: BLUE CROSS/BLUE SHIELD | Admitting: Pulmonary Disease

## 2017-12-28 ENCOUNTER — Encounter: Payer: Self-pay | Admitting: Pulmonary Disease

## 2017-12-28 VITALS — BP 128/84 | HR 70 | Ht 72.0 in | Wt >= 6400 oz

## 2017-12-28 DIAGNOSIS — J449 Chronic obstructive pulmonary disease, unspecified: Secondary | ICD-10-CM

## 2017-12-28 DIAGNOSIS — R0602 Shortness of breath: Secondary | ICD-10-CM

## 2017-12-28 DIAGNOSIS — G4733 Obstructive sleep apnea (adult) (pediatric): Secondary | ICD-10-CM

## 2017-12-28 LAB — PULMONARY FUNCTION TEST
DL/VA % PRED: 125 %
DL/VA: 5.93 ml/min/mmHg/L
DLCO unc % pred: 94 %
DLCO unc: 33.07 ml/min/mmHg
FEF 25-75 Post: 1.9 L/sec
FEF 25-75 Pre: 2.11 L/sec
FEF2575-%Change-Post: -10 %
FEF2575-%Pred-Post: 57 %
FEF2575-%Pred-Pre: 63 %
FEV1-%Change-Post: 0 %
FEV1-%PRED-POST: 67 %
FEV1-%PRED-PRE: 68 %
FEV1-POST: 2.69 L
FEV1-PRE: 2.7 L
FEV1FVC-%Change-Post: 4 %
FEV1FVC-%Pred-Pre: 99 %
FEV6-%Change-Post: -4 %
FEV6-%PRED-POST: 67 %
FEV6-%Pred-Pre: 71 %
FEV6-POST: 3.37 L
FEV6-PRE: 3.55 L
FEV6FVC-%CHANGE-POST: 0 %
FEV6FVC-%PRED-POST: 103 %
FEV6FVC-%PRED-PRE: 103 %
FVC-%Change-Post: -4 %
FVC-%Pred-Post: 65 %
FVC-%Pred-Pre: 68 %
FVC-Post: 3.41 L
FVC-Pre: 3.58 L
POST FEV6/FVC RATIO: 99 %
PRE FEV6/FVC RATIO: 99 %
Post FEV1/FVC ratio: 79 %
Pre FEV1/FVC ratio: 75 %
RV % PRED: 100 %
RV: 2.31 L
TLC % PRED: 79 %
TLC: 5.84 L

## 2017-12-28 LAB — NITRIC OXIDE: NITRIC OXIDE: 10

## 2017-12-28 MED ORDER — RANITIDINE HCL 150 MG PO CAPS: 150.0000 mg | ORAL_CAPSULE | Freq: Two times a day (BID) | ORAL | 2 refills | Status: DC

## 2017-12-28 NOTE — Progress Notes (Addendum)
Mark Harrell    [id number]    1961/02/27  Primary Care Physician:McKeown, Gwyndolyn Saxon, MD  Referring Physician: [name], MD [address] [place], [place] [address]  Chief complaint: Follow-up for asthma, COPD overlap syndrome obstructive sleep apnea, hypoxic respiratory failure  HPI: 57 year old with past medical history of asthma, COPD, OSA, hypoxic respiratory failure Is a former patient of Dr. Ashok Cordia Being treated with trelegy.  Previously on Nucala for asthma in 2018 which did not improve symptoms He has been switched to [place] on January 2019.  Follows with allergy and immunology  On CPAP for sleep apnea.  He has been having issues with nasal congestion for the past few months and has stopped using the Pap therapy Follows with ENT, Dr. Lucia Gaskins and has been told that he may need sinus surgery.  Pets: No pets Occupation: Used to work in Equities trader, Wellsite geologist.   Exposures: No known exposures, no mold, hot tub, leaks Smoking history: Never smoker.  Exposed to secondhand smoke Travel history: Previously lived in [place], [place], [place], [place].  No recent travel.  Outpatient Encounter Medications as of 12/28/2017  Medication Sig  . albuterol (PROVENTIL HFA;VENTOLIN HFA) 108 (90 Base) MCG/ACT inhaler 1 to 2 inhalations 10 to 15 minutes apart every 4 hrs to rescue Asthma  . albuterol (PROVENTIL) (2.5 MG/3ML) 0.083% nebulizer solution Inhale 2.5 mg into the lungs every 4 (four) hours as needed.  [name] alfuzosin (UROXATRAL) 10 MG 24 hr tablet Take 10 mg by mouth daily.    . baclofen (LIORESAL) 10 MG tablet Take 10 mg by mouth 3 (three) times daily as needed for muscle spasms.   . bisoprolol-hydrochlorothiazide (ZIAC) 5-6.25 MG tablet Take 1 tablet by mouth daily.  . Calcium Carbonate-Vitamin D (CALCIUM + D PO) Take 500 mg by mouth 3 (three) times daily.   . cetaphil (CETAPHIL) lotion Apply topically 2 (two) times daily.  . cetirizine (ZYRTEC) 10 MG  tablet Take 10 mg by mouth daily.  . chlorpheniramine-HYDROcodone (TUSSIONEX) 10-8 MG/5ML SUER Take 5 mLs every 12 (twelve) hours by mouth.  . Cholecalciferol (VITAMIN D PO) Take 10,000-20,000 Units by mouth 2 (two) times daily. 20000 in the morning and 10000 in the evening  . clotrimazole-betamethasone (LOTRISONE) cream APPLY TO AFFECTED AREA TWICE A DAY  . co-enzyme Q-10 30 MG capsule Take 30 mg by mouth daily.  . cyclobenzaprine (FLEXERIL) 10 MG tablet Take 10 mg by mouth 3 (three) times daily.   [name] desmopressin (DDAVP) 0.2 MG tablet Take 200 mcg by mouth daily.   . diclofenac (VOLTAREN) 75 MG EC tablet Take 1 tablet (75 mg total) by mouth 2 (two) times daily.  . diclofenac sodium (VOLTAREN) 1 % GEL APPLY 4 GRAMS TOPICALLY 4 TIMES DAILY  . donepezil (ARICEPT) 23 MG TABS tablet Take 23 mg by mouth at bedtime.  . Dupilumab, Asthma, (DUPIXENT) 200 MG/1.14ML SOSY Inject into the skin. Injects bi-weekly  . DYMISTA 137-50 MCG/ACT SUSP PLACE 2 SPRAYS INTO BOTH NOSTRILS 2 (TWO) TIMES DAILY.  [name] Eluxadoline (VIBERZI) 100 MG TABS Take 1 tablet by mouth 2 (two) times daily.   . Fluticasone-Umeclidin-Vilant (TRELEGY ELLIPTA) 100-62.5-25 MCG/INH AEPB Inhale 1 puff into the lungs daily.  . furosemide (LASIX) 40 MG tablet TAKE 1 TABLET TWICE A DAY FOR FLUID RETENTION AND SWELLING OF LEGS  . Galcanezumab-gnlm (EMGALITY) 120 MG/ML SOAJ Inject into the skin. Pt receives this monthly ingection  . GLUCOSAMINE-CHONDROITIN PO Take 3 tablets by mouth daily.  [name]  guaiFENesin (MUCINEX) 600 MG 12 hr tablet Take 600 mg by mouth 2 (two) times daily as needed for cough or to loosen phlegm. Instructed to take 3 tablets in the morning  . HYDROcodone-homatropine (HYCODAN) 5-1.5 MG/5ML syrup Take 5 mLs by mouth every 6 (six) hours as needed for cough. Do not take with other opioids such as tramadol.  . LUTEIN PO Take 1 tablet by mouth 2 (two) times daily.  [name] LYRICA 150 MG capsule Take 300 mg by mouth 2 (two) times daily.  .  memantine (NAMENDA) 10 MG tablet Take 10 mg by mouth 2 (two) times daily.   . Methylfol-Algae-B12-Acetylcyst (CEREFOLIN NAC) 6-90.314-2-600 MG TABS Take 1 tablet by mouth daily.   . montelukast (SINGULAIR) 10 MG tablet TAKE 1 TABLET BY MOUTH EVERY DAY  . Multiple Vitamins-Minerals (MULTIVITAMIN WITH MINERALS) tablet Take 1 tablet by mouth daily.  . ondansetron (ZOFRAN-ODT) 4 MG disintegrating tablet TAKE 1 TABLET (4 MG TOTAL) BY MOUTH EVERY 6 (SIX) HOURS AS NEEDED FOR NAUSEA OR VOMITING.  [name] oxyCODONE-acetaminophen (PERCOCET) 10-325 MG tablet Take 1 tablet by mouth every 4 (four) hours as needed for pain.  [name] oxymetazoline (AFRIN) 0.05 % nasal spray Place 1 spray into both nostrils See admin instructions. 1 SPRAY PER SIDE EACH NIGHT BEFORE DYMISTA  . pentosan polysulfate (ELMIRON) 100 MG capsule Take 100 mg by mouth 3 (three) times daily before meals.   . Phosphatidylserine-DHA-EPA (VAYACOG PO) Take 1 capsule by mouth 2 (two) times daily.  . Potassium 99 MG TABS Take 1 tablet by mouth 3 (three) times daily.   . pravastatin (PRAVACHOL) 40 MG tablet TAKE 1 TABLET BY MOUTH AT BEDTIME  . predniSONE (DELTASONE) 20 MG tablet 1 tab 3 x day for 3 days, then 1 tab 2 x day for 3 days, then 1 tab 1 x day for 5 days  . PRESCRIPTION MEDICATION Pt receives weekly allergy shots  . ranitidine (ZANTAC) 150 MG capsule Take 1 capsule (150 mg total) by mouth 2 (two) times daily.  . sodium chloride (OCEAN) 0.65 % SOLN nasal spray Place 1 spray into both nostrils 4 (four) times daily as needed for congestion. Uses each time before other nasal sprays  . tadalafil (CIALIS) 5 MG tablet Take 5 mg by mouth every evening.   . traMADol (ULTRAM) 50 MG tablet TAKE 1 TABLET EVERY 6 HOURS AS NEEDED FOR COUGH  . triamcinolone cream (KENALOG) 0.1 % APPLY TOPICALLY 4 TIMES A DAY (Patient taking differently: APPLY TOPICALLY 2 TIMES A DAY AS NEEDED for itching)  . trospium (SANCTURA) 20 MG tablet Take 20 mg by mouth 2 (two) times daily.    . TURMERIC PO Take 3 capsules by mouth daily.  . [DISCONTINUED] Probiotic Product (VSL#3 DS) PACK Take 2 each by mouth daily.  . [DISCONTINUED] azithromycin (ZITHROMAX) 250 MG tablet Take 2 tablets (500 mg) on  Day 1,  followed by 1 tablet (250 mg) once daily on Days 2 through 5.   Facility-Administered Encounter Medications as of 12/28/2017  Medication  . Mepolizumab SOLR 100 mg    Allergies as of 12/28/2017 - Review Complete 12/28/2017  Allergen Reaction Noted  . Bee venom Swelling 08/13/2015  . Ppd [tuberculin purified protein derivative] Other (See Comments) 04/13/2013  . Fenofibrate Other (See Comments) 01/31/2011  . Levofloxacin Diarrhea 07/16/2016  . Other Other (See Comments) 07/16/2016  . Verapamil Other (See Comments) 05/10/2015  . Claritin [loratadine] Other (See Comments) 07/16/2016    Past Medical History:  Diagnosis Date  .  Anesthesia complication requiring reversal agent administration    ? from central apnea, very difficult to get off vent  . Anxiety   . Arthritis    osteo  . Asthma   . BPH (benign prostatic hyperplasia)   . Complication of anesthesia    difficulty waking , they twlight me because of my respiratory problems "  . Dyspnea   . Enlarged heart   . Family history of adverse reaction to anesthesia    mother trouble waking up, and heart stopped  . GERD (gastroesophageal reflux disease)   . Headache    botox injections for headaches  . Hyperlipidemia   . Hypertension   . Hypogonadism male   . IBS (irritable bowel syndrome)   . Obesity   . OSA (obstructive sleep apnea)    cpap  . Pneumonia   . Pre-diabetes   . Prostatitis     Past Surgical History:  Procedure Laterality Date  . ABDOMINAL SURGERY    . ANKLE FRACTURE SURGERY Right   . CYSTOSCOPY     [place]  . KNEE ARTHROSCOPY WITH MEDIAL MENISECTOMY Left 01/02/2017   Procedure: LEFT KNEE ARTHROSCOPY WITH PARTIAL MEDIAL MENISCECTOMY;  Surgeon: Mcarthur Rossetti, MD;  Location: [facility]  [facility];  Service: Orthopedics;  Laterality: Left;  . TONSILLECTOMY    . [place] RESECTION  2007  . UVULOPALATOPHARYNGOPLASTY      Family History  Problem Relation Age of Onset  . Diabetes Paternal Uncle   . Cancer Father        lymphoma, colon  . Diabetes Maternal Grandmother   . Heart disease Maternal Grandfather   . Diabetes Maternal Grandfather   . Diabetes Paternal Grandmother   . Diabetes Paternal Grandfather   . Dementia Mother   . Prostate cancer Maternal Uncle   . Lung disease Neg Hx   . Rheumatologic disease Neg Hx     Social History   Socioeconomic History  . Marital status: Single    Spouse name: Not on file  . Number of children: Not on file  . Years of education: Not on file  . Highest education level: Not on file  Occupational History  . Occupation: Dance movement psychotherapist  Social Needs  . Financial resource strain: Not on file  . Food insecurity:    Worry: Not on file    Inability: Not on file  . Transportation needs:    Medical: Not on file    Non-medical: Not on file  Tobacco Use  . Smoking status: Former Smoker    Packs/day: 0.10    Years: 15.00    Pack years: 1.50  . Smokeless tobacco: Never Used  . Tobacco comment: significant second-hand exposure through mother  Substance and Sexual Activity  . Alcohol use: Yes    Alcohol/week: 0.6 oz    Types: 1 Glasses of wine per week    Comment: 2 x a year  . Drug use: No  . Sexual activity: Never  Lifestyle  . Physical activity:    Days per week: Not on file    Minutes per session: Not on file  . Stress: Not on file  Relationships  . Social connections:    Talks on phone: Not on file    Gets together: Not on file    Attends religious service: Not on file    Active member of club or organization: Not on file    Attends meetings of clubs or organizations: Not on file    Relationship status: Not on  file  . Intimate partner violence:    Fear of current or ex partner: Not on file    Emotionally  abused: Not on file    Physically abused: Not on file    Forced sexual activity: Not on file  Other Topics Concern  . Not on file  Social History Narrative   [facility]:   Originally from [place]. Previously has lived in [place]. He has lived in [place], [place], & [place]. He has worked in Engineer, production. No pets currently. Brief exposure to a roommates bird Secretary/administrator) in college. No mold, asbestos, or hot tub exposure.     Review of systems: Review of Systems  Constitutional: Negative for fever and chills.  HENT: Negative.   Eyes: Negative for blurred vision.  Respiratory: as per HPI  Cardiovascular: Negative for chest pain and palpitations.  Gastrointestinal: Negative for vomiting, diarrhea, blood per rectum. Genitourinary: Negative for dysuria, urgency, frequency and hematuria.  Musculoskeletal: Negative for myalgias, back pain and joint pain.  Skin: Negative for itching and rash.  Neurological: Negative for dizziness, tremors, focal weakness, seizures and loss of consciousness.  Endo/Heme/Allergies: Negative for environmental allergies.  Psychiatric/Behavioral: Negative for depression, suicidal ideas and hallucinations.  All other systems reviewed and are negative.  Physical Exam: Blood pressure 128/84, pulse 70, height 6' (1.829 m), weight (!) 406 lb (184.2 kg), SpO2 94 %. Gen:      No acute distress HEENT:  EOMI, sclera anicteric Neck:     No masses; no thyromegaly Lungs:    Clear to auscultation bilaterally; normal respiratory effort CV:         Regular rate and rhythm; no murmurs Abd:      + bowel sounds; soft, non-tender; no palpable masses, no distension Ext:    No edema; adequate peripheral perfusion Skin:      Warm and dry; no rash Neuro: alert and oriented x 3 Psych: normal mood and affect  Data Reviewed: FENO 12/28/2017-10  PFTs 12/28/2017 FVC 3.41 [65%), FEV1 2.69 [67%], F/F 79, TLC 79%, DLCO 94% Minimal obstruction, mild  restriction  6MWT 03/09/17:  Walked 2 laps / Baseline Sat 97% on RA / Nadir Sat 97% on RA (walked at a slow pace with a cane - stopped due to dyspnea) 05/08/16:  Walked 321 meters / Baseline Sat 93% on RA / Nadir Sat 93% on RA @ rest  COMPLIANCE DOWNLOAD 06/03 - 11/24/16:  7% compliance with >/= 4 hours of use. Used 5 days in last 30. Residual AHI 3.2 events/hr. Maximum pressure 12.8 cm H2O.   SPLIT NIGHT SLEEP STUDY (05/07/16): Severe obstructive sleep apnea with AHI 65.7 events/hour. No significant central sleep apnea occurred. Severe desaturation during diagnostic portion to 72% on room air. No cardiac abnormalities or periodic limb movements were noted. Optimal CPAP pressure was 18 cm H2O with a large-sized [employer]Paykel Full Face Mask Simplus mask. Reading physician recommended AutoPap at 10-18 cm H2O pressure.  IMAGING CT chest 01/04/2017- no PE, lungs are clear. Chest x-ray 12/08/2017- no active cardiopulmonary disease. I reviewed the images personally.  ESOPHAGRAM/BARIUM SWALLOW 04/15/16 (per radiologist): Mild nonspecific esophageal dysmotility disorder. Episode of flash laryngeal penetration with swallowing. Small sliding type I hiatal hernia. No visible mass, erosion, or ulcerations.  MICROBIOLOGY Sputum Culture 02/16/17:  Normal [place]   LABS 04/10/16 Alpha-1 antitrypsin: MM (135)  03/28/16 CBC: 11.8/14.2/43.0/309 Eosinophils:  354 IgG: 583 IgA: 154 IgM: 46 IgE: 92  11/02/13 ANA:  Negative DS DNA Ab:  <1 SSA:  <  1.0 SSB:  <1.0  02/24/11 ACE:  35 RF:  <10  Assessment:  Asthma, COPD overlap syndrome PFTs reviewed with minimal obstruction.  He is currently on trelegy inhaler has ongoing issues with dyspnea on exertion Chest is clear on auscultation Suspect that his symptoms are likely due to obesity, deconditioning Encouraged him to start an exercise program, diet, weight loss Continue Dupixent  OSA Noncompliant CPAP due to sinus congestion. Encouraged  resumption of therapy  Hypoxic respiratory failure No interstitial lung disease on review of chest imaging He had transient desats to around 88% on exertion today I have asked him to continue supplemental oxygen for now.  Reassess at return visit.  GERD Continue Zantac.  Plan/Recommendations: - Continue Trelegy, Dupixent - Exercise program, diet, weight loss - Resume CPAP as soon as possible - Continue supplemental oxygen  Marshell Garfinkel MD [facility] 12/28/2017, 4:05 PM  CC: [name], MD

## 2017-12-28 NOTE — Progress Notes (Signed)
PFT completed today. 12/28/17  

## 2017-12-29 ENCOUNTER — Encounter (HOSPITAL_COMMUNITY): Payer: BLUE CROSS/BLUE SHIELD

## 2017-12-29 ENCOUNTER — Encounter (HOSPITAL_COMMUNITY): Payer: Self-pay | Admitting: *Deleted

## 2017-12-29 ENCOUNTER — Encounter: Payer: Self-pay | Admitting: Cardiovascular Disease

## 2017-12-29 ENCOUNTER — Ambulatory Visit (INDEPENDENT_AMBULATORY_CARE_PROVIDER_SITE_OTHER): Payer: BLUE CROSS/BLUE SHIELD | Admitting: Cardiovascular Disease

## 2017-12-29 DIAGNOSIS — G4733 Obstructive sleep apnea (adult) (pediatric): Secondary | ICD-10-CM | POA: Diagnosis not present

## 2017-12-29 DIAGNOSIS — I10 Essential (primary) hypertension: Secondary | ICD-10-CM

## 2017-12-29 DIAGNOSIS — R600 Localized edema: Secondary | ICD-10-CM | POA: Diagnosis not present

## 2017-12-29 DIAGNOSIS — R0789 Other chest pain: Secondary | ICD-10-CM

## 2017-12-29 DIAGNOSIS — E782 Mixed hyperlipidemia: Secondary | ICD-10-CM | POA: Diagnosis not present

## 2017-12-29 DIAGNOSIS — J449 Chronic obstructive pulmonary disease, unspecified: Secondary | ICD-10-CM

## 2017-12-29 NOTE — Assessment & Plan Note (Signed)
History of hyperlipidemia on statin therapy with lipid profile performed 11/25/2017 revealing total cholesterol 114, try glyceride level of 275.

## 2017-12-29 NOTE — Progress Notes (Signed)
12/29/2017 Mark Harrell   10/30/1960  [id number]  Primary Physician [name], MD Primary Cardiologist: Lorretta Harp MD Mark Harrell, [place]  HPI:  Mark Harrell is a 57 y.o.  morbidly overweight single Caucasian male who children referred by Dr. Melford Aase for cardiovascular evaluation because of hypertension, cardiac enlargement and lower extremity edema.  I last saw him in the office 02/10/2017.  He has no prior cardiac history. His risk factors include treated hypertension and hyperlipidemia. There is no family history. He's never had a heart attack or stroke. He denies chest pain but does get dyspneic on exertion probably related to his body habitus and deconditioning. He also has chronic mild lower extremity edema probably related to his obesity as well. He's been told that he has cardiac enlargement in the past. A 2D echocardiogram performed 02/23/2017 revealed normal LV size and function.  He does have obstructive sleep apnea currently not on CPAP.  He is also had an episode of chest pain which resulted in ER eval with negative troponins recently and has had several less intense episodes since that time which has been attributed to esophageal spasm or GERD.  Unfortunately, his body size and habitus preclude noninvasive evaluation.     Current Meds  Medication Sig  . albuterol (PROVENTIL HFA;VENTOLIN HFA) 108 (90 Base) MCG/ACT inhaler 1 to 2 inhalations 10 to 15 minutes apart every 4 hrs to rescue Asthma  . albuterol (PROVENTIL) (2.5 MG/3ML) 0.083% nebulizer solution Inhale 2.5 mg into the lungs every 4 (four) hours as needed.  [name] alfuzosin (UROXATRAL) 10 MG 24 hr tablet Take 10 mg by mouth daily.    . baclofen (LIORESAL) 10 MG tablet Take 10 mg by mouth 3 (three) times daily as needed for muscle spasms.   . bisoprolol-hydrochlorothiazide (ZIAC) 5-6.25 MG tablet Take 1 tablet by mouth daily.  . Calcium Carbonate-Vitamin D (CALCIUM + D PO) Take 500 mg by mouth 3 (three) times  daily.   . cetaphil (CETAPHIL) lotion Apply topically 2 (two) times daily.  . cetirizine (ZYRTEC) 10 MG tablet Take 10 mg by mouth daily.  . chlorpheniramine-HYDROcodone (TUSSIONEX) 10-8 MG/5ML SUER Take 5 mLs every 12 (twelve) hours by mouth.  . Cholecalciferol (VITAMIN D PO) Take 10,000-20,000 Units by mouth 2 (two) times daily. 20000 in the morning and 10000 in the evening  . clotrimazole-betamethasone (LOTRISONE) cream APPLY TO AFFECTED AREA TWICE A DAY  . co-enzyme Q-10 30 MG capsule Take 30 mg by mouth daily.  . cyclobenzaprine (FLEXERIL) 10 MG tablet Take 10 mg by mouth 3 (three) times daily.   [name] desmopressin (DDAVP) 0.2 MG tablet Take 200 mcg by mouth daily.   . diclofenac (VOLTAREN) 75 MG EC tablet Take 1 tablet (75 mg total) by mouth 2 (two) times daily.  . diclofenac sodium (VOLTAREN) 1 % GEL APPLY 4 GRAMS TOPICALLY 4 TIMES DAILY  . donepezil (ARICEPT) 23 MG TABS tablet Take 23 mg by mouth at bedtime.  . Dupilumab, Asthma, (DUPIXENT) 200 MG/1.14ML SOSY Inject into the skin. Injects bi-weekly  . DYMISTA 137-50 MCG/ACT SUSP PLACE 2 SPRAYS INTO BOTH NOSTRILS 2 (TWO) TIMES DAILY.  [name] Eluxadoline (VIBERZI) 100 MG TABS Take 1 tablet by mouth 2 (two) times daily.   . Fluticasone-Umeclidin-Vilant (TRELEGY ELLIPTA) 100-62.5-25 MCG/INH AEPB Inhale 1 puff into the lungs daily.  . furosemide (LASIX) 40 MG tablet TAKE 1 TABLET TWICE A DAY FOR FLUID RETENTION AND SWELLING OF LEGS (Patient taking differently: Take 120 mg by  mouth every morning. TAKE 1 TABLET TWICE A DAY FOR FLUID RETENTION AND SWELLING OF LEGS)  . Galcanezumab-gnlm (EMGALITY) 120 MG/ML SOAJ Inject into the skin. Pt receives this monthly ingection  . GLUCOSAMINE-CHONDROITIN PO Take 3 tablets by mouth daily.  [name] guaiFENesin (MUCINEX) 600 MG 12 hr tablet Take 600 mg by mouth 2 (two) times daily as needed for cough or to loosen phlegm. Instructed to take 3 tablets in the morning  . HYDROcodone-homatropine (HYCODAN) 5-1.5 MG/5ML syrup Take  5 mLs by mouth every 6 (six) hours as needed for cough. Do not take with other opioids such as tramadol.  . LUTEIN PO Take 1 tablet by mouth 2 (two) times daily.  [name] LYRICA 150 MG capsule Take 300 mg by mouth 2 (two) times daily.  . memantine (NAMENDA) 10 MG tablet Take 10 mg by mouth 2 (two) times daily.   . Methylfol-Algae-B12-Acetylcyst (CEREFOLIN NAC) 6-90.314-2-600 MG TABS Take 1 tablet by mouth daily.   . montelukast (SINGULAIR) 10 MG tablet TAKE 1 TABLET BY MOUTH EVERY DAY  . Multiple Vitamins-Minerals (MULTIVITAMIN WITH MINERALS) tablet Take 1 tablet by mouth daily.  . ondansetron (ZOFRAN-ODT) 4 MG disintegrating tablet TAKE 1 TABLET (4 MG TOTAL) BY MOUTH EVERY 6 (SIX) HOURS AS NEEDED FOR NAUSEA OR VOMITING.  [name] oxyCODONE-acetaminophen (PERCOCET) 10-325 MG tablet Take 1 tablet by mouth every 4 (four) hours as needed for pain.  [name] oxymetazoline (AFRIN) 0.05 % nasal spray Place 1 spray into both nostrils See admin instructions. 1 SPRAY PER SIDE EACH NIGHT BEFORE DYMISTA  . pentosan polysulfate (ELMIRON) 100 MG capsule Take 100 mg by mouth 3 (three) times daily before meals.   . Phosphatidylserine-DHA-EPA (VAYACOG PO) Take 1 capsule by mouth 2 (two) times daily.  . Potassium 99 MG TABS Take 1 tablet by mouth 3 (three) times daily.   . pravastatin (PRAVACHOL) 40 MG tablet TAKE 1 TABLET BY MOUTH AT BEDTIME (Patient taking differently: TAKE 1/2 TABLET BY MOUTH AT BEDTIME)  . predniSONE (DELTASONE) 20 MG tablet 1 tab 3 x day for 3 days, then 1 tab 2 x day for 3 days, then 1 tab 1 x day for 5 days  . PRESCRIPTION MEDICATION Pt receives weekly allergy shots  . ranitidine (ZANTAC) 150 MG capsule Take 1 capsule (150 mg total) by mouth 2 (two) times daily.  . sodium chloride (OCEAN) 0.65 % SOLN nasal spray Place 1 spray into both nostrils 4 (four) times daily as needed for congestion. Uses each time before other nasal sprays  . tadalafil (CIALIS) 5 MG tablet Take 5 mg by mouth every evening.   . traMADol  (ULTRAM) 50 MG tablet TAKE 1 TABLET EVERY 6 HOURS AS NEEDED FOR COUGH  . triamcinolone cream (KENALOG) 0.1 % APPLY TOPICALLY 4 TIMES A DAY (Patient taking differently: APPLY TOPICALLY 2 TIMES A DAY AS NEEDED for itching)  . trospium (SANCTURA) 20 MG tablet Take 20 mg by mouth 2 (two) times daily.   . TURMERIC PO Take 3 capsules by mouth daily.   Current Facility-Administered Medications for the 12/29/17 encounter (Office Visit) with Lorretta Harp, MD  Medication  . Mepolizumab SOLR 100 mg     Allergies  Allergen Reactions  . Bee Venom Swelling  . Ppd [Tuberculin Purified Protein Derivative] Other (See Comments)    +ppd NEG Quantferron Gold 3/13  . Fenofibrate Other (See Comments)    Back pain  . Levofloxacin Diarrhea  . Other Other (See Comments)    Some antibiotics cause diarrhea  .  Verapamil Other (See Comments)    Back pain  . Claritin [Loratadine] Other (See Comments)    unknown    Social History   Socioeconomic History  . Marital status: Single    Spouse name: Not on file  . Number of children: Not on file  . Years of education: Not on file  . Highest education level: Not on file  Occupational History  . Occupation: Dance movement psychotherapist  Social Needs  . Financial resource strain: Not on file  . Food insecurity:    Worry: Not on file    Inability: Not on file  . Transportation needs:    Medical: Not on file    Non-medical: Not on file  Tobacco Use  . Smoking status: Former Smoker    Packs/day: 0.10    Years: 15.00    Pack years: 1.50  . Smokeless tobacco: Never Used  . Tobacco comment: significant second-hand exposure through mother  Substance and Sexual Activity  . Alcohol use: Yes    Alcohol/week: 0.6 oz    Types: 1 Glasses of wine per week    Comment: 2 x a year  . Drug use: No  . Sexual activity: Never  Lifestyle  . Physical activity:    Days per week: Not on file    Minutes per session: Not on file  . Stress: Not on file  Relationships  .  Social connections:    Talks on phone: Not on file    Gets together: Not on file    Attends religious service: Not on file    Active member of club or organization: Not on file    Attends meetings of clubs or organizations: Not on file    Relationship status: Not on file  . Intimate partner violence:    Fear of current or ex partner: Not on file    Emotionally abused: Not on file    Physically abused: Not on file    Forced sexual activity: Not on file  Other Topics Concern  . Not on file  Social History Narrative   [facility]:   Originally from [place]. Previously has lived in [place]. He has lived in [place], [place], & [place]. He has worked in Engineer, production. No pets currently. Brief exposure to a roommates bird Secretary/administrator) in college. No mold, asbestos, or hot tub exposure.      Review of Systems: General: negative for chills, fever, night sweats or weight changes.  Cardiovascular: negative for chest pain, dyspnea on exertion, edema, orthopnea, palpitations, paroxysmal nocturnal dyspnea or shortness of breath Dermatological: negative for rash Respiratory: negative for cough or wheezing Urologic: negative for hematuria Abdominal: negative for nausea, vomiting, diarrhea, bright red blood per rectum, melena, or hematemesis Neurologic: negative for visual changes, syncope, or dizziness All other systems reviewed and are otherwise negative except as noted above.    Blood pressure 140/82, pulse 84, height 6' (1.829 m), weight (!) 410 lb (186 kg).  General appearance: alert and no distress Neck: no adenopathy, no carotid bruit, no JVD, supple, symmetrical, trachea midline and thyroid not enlarged, symmetric, no tenderness/mass/nodules Lungs: clear to auscultation bilaterally Heart: regular rate and rhythm, S1, S2 normal, no murmur, click, rub or gallop Extremities: 2-3+ pitting lower extremity edema. Pulses: 2+ and symmetric Skin: Skin color, texture, turgor  normal. No rashes or lesions Neurologic: Alert and oriented X 3, normal strength and tone. Normal symmetric reflexes. Normal coordination and gait  EKG not performed today  ASSESSMENT AND  PLAN:   Hypertension History of essential hypertension her blood pressure measured today 140/82.  He is on Ziac.  Continue current meds at current dosing.  Hyperlipidemia, mixed History of hyperlipidemia on statin therapy with lipid profile performed 11/25/2017 revealing total cholesterol 114, try glyceride level of 275.  OSA and COPD overlap syndrome ([place]) History of obstructive sleep apnea currently not on CPAP.  Bilateral lower extremity edema History of bilateral lower extremity edema on high-dose diuretic therapy currently with 2-3+ pitting edema.  Atypical chest pain Recent ER eval for atypical chest pain with negative troponins.  He has had several episodes since that time.  Does have positive cardiac risk factors.  His body habitus and weight are not conducive to noninvasive or invasive imaging at this time.      Lorretta Harp MD FACP,FACC,FAHA, [facility] 12/29/2017 9:02 AM

## 2017-12-29 NOTE — Patient Instructions (Signed)

## 2017-12-29 NOTE — Progress Notes (Signed)
Daily Session Note  Patient Details  Name: Mark Harrell MRN: [id number] Date of Birth: [birth year] Referring Provider:     Pulmonary Rehab Walk Test from 10/20/2017 in [place]  Referring Provider  Dr. [name]      Encounter Date: 12/29/2017  Check In:   Capillary Blood Glucose: No results found for this or any previous visit (from the past 24 hour(s)).  Exercise Prescription Changes - 12/29/17 1700      Response to Exercise   Blood Pressure (Admit)  110/80    Blood Pressure (Exercise)  120/80    Blood Pressure (Exit)  108/80    Heart Rate (Admit)  101 bpm    Heart Rate (Exercise)  102 bpm    Heart Rate (Exit)  104 bpm    Oxygen Saturation (Admit)  94 %    Oxygen Saturation (Exercise)  93 %    Oxygen Saturation (Exit)  94 %    Rating of Perceived Exertion (Exercise)  16    Perceived Dyspnea (Exercise)  3    Duration  Progress to 45 minutes of aerobic exercise without signs/symptoms of physical distress    Intensity  THRR unchanged      Progression   Progression  Continue to progress workloads to maintain intensity without signs/symptoms of physical distress.      Resistance Training   Training Prescription  Yes    Weight  blue bands    Reps  10-15    Time  10 Minutes      Interval Training   Interval Training  No      Oxygen   Oxygen  Continuous    Liters  2      NuStep   Level  1    SPM  80    Minutes  51    METs  2.1       Social History   Tobacco Use  Smoking Status Former Smoker  . Packs/day: 0.10  . Years: 15.00  . Pack years: 1.50  Smokeless Tobacco Never Used  Tobacco Comment   significant second-hand exposure through mother    Goals Met:  Exercise tolerated well No report of cardiac concerns or symptoms Strength training completed today  Goals Unmet:  Not Applicable  Comments:  Service time is from 1330 to 1500      Dr. Rush Farmer is Medical Director for Pulmonary Rehab at [facility].

## 2017-12-29 NOTE — Assessment & Plan Note (Signed)
History of obstructive sleep apnea currently not on CPAP.

## 2017-12-29 NOTE — Assessment & Plan Note (Signed)
History of essential hypertension her blood pressure measured today 140/82.  He is on Ziac.  Continue current meds at current dosing.

## 2017-12-29 NOTE — Assessment & Plan Note (Signed)
Recent ER eval for atypical chest pain with negative troponins.  He has had several episodes since that time.  Does have positive cardiac risk factors.  His body habitus and weight are not conducive to noninvasive or invasive imaging at this time.

## 2017-12-29 NOTE — Assessment & Plan Note (Signed)
History of bilateral lower extremity edema on high-dose diuretic therapy currently with 2-3+ pitting edema.

## 2017-12-31 ENCOUNTER — Encounter: Payer: Self-pay | Admitting: Internal Medicine

## 2017-12-31 ENCOUNTER — Encounter (HOSPITAL_COMMUNITY): Payer: BLUE CROSS/BLUE SHIELD

## 2017-12-31 ENCOUNTER — Ambulatory Visit (INDEPENDENT_AMBULATORY_CARE_PROVIDER_SITE_OTHER): Payer: BLUE CROSS/BLUE SHIELD | Admitting: Internal Medicine

## 2017-12-31 VITALS — BP 140/80 | HR 110 | Temp 97.5°F | Resp 18 | Ht 72.5 in | Wt >= 6400 oz

## 2017-12-31 DIAGNOSIS — I872 Venous insufficiency (chronic) (peripheral): Secondary | ICD-10-CM | POA: Diagnosis not present

## 2017-12-31 DIAGNOSIS — I10 Essential (primary) hypertension: Secondary | ICD-10-CM | POA: Diagnosis not present

## 2017-12-31 DIAGNOSIS — Z79899 Other long term (current) drug therapy: Secondary | ICD-10-CM

## 2017-12-31 LAB — COMPLETE METABOLIC PANEL WITH GFR
AG Ratio: 1.8 (calc) (ref 1.0–2.5)
ALKALINE PHOSPHATASE (APISO): 79 U/L (ref 40–115)
ALT: 26 U/L (ref 9–46)
AST: 16 U/L (ref 10–35)
Albumin: 4.2 g/dL (ref 3.6–5.1)
BUN: 25 mg/dL (ref 7–25)
CO2: 35 mmol/L — AB (ref 20–32)
Calcium: 9.8 mg/dL (ref 8.6–10.3)
Chloride: 98 mmol/L (ref 98–110)
Creat: 1.07 mg/dL (ref 0.70–1.33)
GFR, Est African American: 89 mL/min/{1.73_m2} (ref >=60)
GFR, Est Non African American: 77 mL/min/{1.73_m2} (ref >=60)
GLUCOSE: 113 mg/dL — AB (ref 65–99)
Globulin: 2.4 g/dL (calc) (ref 1.9–3.7)
Potassium: 4.6 mmol/L (ref 3.5–5.3)
Sodium: 143 mmol/L (ref 135–146)
Total Bilirubin: 1.5 mg/dL — ABNORMAL HIGH (ref 0.2–1.2)
Total Protein: 6.6 g/dL (ref 6.1–8.1)

## 2017-12-31 LAB — CBC WITH DIFFERENTIAL/PLATELET
BASOS PCT: 0.6 %
Basophils Absolute: 86 cells/uL (ref 0–200)
Eosinophils Absolute: 129 cells/uL (ref 15–500)
Eosinophils Relative: 0.9 %
HEMATOCRIT: 53.2 % — AB (ref 38.5–50.0)
Hemoglobin: 17.5 g/dL — ABNORMAL HIGH (ref 13.2–17.1)
LYMPHS ABS: 2360 {cells}/uL (ref 850–3900)
MCH: 28.5 pg (ref 27.0–33.0)
MCHC: 32.9 g/dL (ref 32.0–36.0)
MCV: 86.8 fL (ref 80.0–100.0)
MPV: 11.3 fL (ref 7.5–12.5)
Monocytes Relative: 8 %
NEUTROS ABS: 10582 {cells}/uL — AB (ref 1500–7800)
Neutrophils Relative %: 74 %
PLATELETS: 237 10*3/uL (ref 140–400)
RBC: 6.13 10*6/uL — AB (ref 4.20–5.80)
RDW: 14.4 % (ref 11.0–15.0)
TOTAL LYMPHOCYTE: 16.5 %
WBC: 14.3 10*3/uL — AB (ref 3.8–10.8)
WBCMIX: 1144 {cells}/uL — AB (ref 200–950)

## 2017-12-31 NOTE — Progress Notes (Signed)
Subjective:    Patient ID: Mark Harrell, male    DOB: 01-Aug-1960, 57 y.o.   MRN: [id number]  HPI  This nice 57 yo single WM with HTN, Morbid Obesity (BMI 55+) returns for f/u. Patient  Recently was evaluated for [facility] CP and was also recently seen in consultation by Dr [name] who felt Cardiac w/u was not indicated. He has had recently more aggressive diuresis for worsening dependent edema  Felt consequent of his severe Obesity. Since last OV, he has diuresed a few #   Wt Readings from Last 3 Encounters:  12/31/17 (!) 403 lb (182.8 kg)  12/29/17 (!) 399 lb 0.5 oz (181 kg)  12/29/17 (!) 410 lb (186 kg)   Medication Sig  . albuterol  HFA MCG/ACT MDI 1 to 2 inhalations 10-15 min apart every 4 hrs to rescue Asthma  . albuterol 2.5 MG/3ML neb soln Inhale 2.5 mg into the lungs every 4 (four) hours as needed.  [name] alfuzosin 10 MG 24 hr tablet Take 10 mg by mouth daily.    . baclofen 10 MG tablet Take 3 ) times daily as needed for muscle spasms.   . bisoprolol-hctz 5-6.25 MG t= Take 1 tablet by mouth daily.  [name] CALCIUM + D Take 500 mg by mouth 3 (three) times daily.   [name] CETAPHIL lotion Apply topically 2 (two) times daily.  . cetirizine  10 MG tablet Take 10 mg by mouth daily.  Amanda Cockayne 10-8 MG/5ML  Take 5 mLs every 12 (twelve) hours by mouth.  [name] VITAMIN D 25852 in the morning and 10000 in the evening  . LOTRISONE cream APPLY TO AFFECTED AREA TWICE A DAY  . co-enzyme Q-10 30 MG capsu Take 30 mg by mouth daily.  . cyclobenzaprine10 MG tablet Take 10 mg by mouth 3 (three) times daily.   . DDAVP 0.2 MG tablet Take 200 mcg by mouth daily.   . diclofenac  75 MG EC tablet Take 1 tablet (75 mg total) by mouth 2 (two) times daily.  . diclofenac 1 % GEL APPLY 4 GRAMS TOPICALLY 4 TIMES DAILY  . donepezil  23 MG TABS tablet Take 23 mg by mouth at bedtime.  . DUPIXENT 200 MG/1.14ML Inject into the skin. Injects bi-weekly  . DYMISTA  SUSP PLACE 2 SPRAYS INTO BOTH NOSTRILS 2 (TWO) TIMES DAILY.  [name] VIBERZI 100  MG TABS Take 1 tablet by mouth 2 (two) times daily.   .  TRELEGY ELLIPTA  100-62.5-25 Inhale 1 puff into the lungs daily.  . furosemide ( 40 MG tablet Take 120 mg by mouth every morning.   [name] EMGALITY 20 MG/ML  Inject  monthly   . GLUCOSAMINE-CHONDROITIN Take 3 tablets daily.  [name] guaiFENesin 600 MG 12 hr  Take 600 mg by mouth 2 (two) times daily as needed for cough or to loosen phlegm. Instructed to take 3 tablets in the morning  . HYCODAN 5-1.5 MG/5ML syrup Take 5 mLs by mouth every 6 (six) hours as needed for cough.   . LUTEIN Take 1 tablet by mouth 2 (two) times daily.  [name] LYRICA 150 MG capsule Take 300 mg by mouth 2 (two) times daily.  [name] NAMENDA 10 MG tablet Take 10 mg by mouth 2 (two) times daily.   . CEREFOLIN 6-90.314-2-600 MG Take 1 tablet by mouth daily.   . montelukast  10 MG tablet TAKE 1 TABLET BY MOUTH EVERY DAY  . Multiple Vitamins-Minerals Take 1 tablet by mouth daily.  [name]  ondansetron-ODT 4 MG TAKE 1 TAB sl EVERY 6  HOURS AS NEEDED   . PERCOCET 10-325 MG  Take 1 tablet by mouth every 4 (four) hours as needed for pain.  . AFRIN 0.05 % nasal spray Place 1 spray into both nostrils See admin instructions. 1 SPRAY PER SIDE EACH NIGHT BEFORE DYMISTA  . ELMIRON 100 MG capsule Take 100 mg by mouth 3 (three) times daily before meals.   [name] VAYACOG Take 1 capsule by mouth 2 (two) times daily.  . Potassium 99 MG TABS Take 1 tablet by mouth 3 (three) times daily.   . pravastatin  40 MG tablet TAKE 1/2 TABLET BY MOUTH AT BEDTIME)  .  Pt receives weekly allergy shots  . ranitidine  150 MG capsule Take 1 capsule (150 mg total) by mouth 2 (two) times daily.  . sodium chloride 0.65 % SOLN nasal  Place 1 spray into both nostrils 4 (four) times daily as needed for congestion. Uses each time before other nasal sprays  . tadalafil  5 MG tablet Take 5 mg by mouth every evening.   . traMADol  50 MG tablet TAKE 1 TABLET EVERY 6 HOURS AS NEEDED FOR COUGH  . triamcinolone crm  0.1 % APPLY TOPICALLY 4 TIMES A  DAY (Patient taking differently: APPLY TOPICALLY 2 TIMES A DAY AS NEEDED for itching)  . SANCTURA 20 MG tablet Take 20 mg by mouth 2 (two) times daily.   . TURMERIC  Take 3 capsules by mouth daily.   Allergies  Allergen Reactions  . Bee Venom Swelling  . Ppd [Tuberculin Purified Protein Derivative] Other (See Comments)    +ppd NEG Quantferron Gold 3/13  . Fenofibrate Other (See Comments)    Back pain  . Levofloxacin Diarrhea  . Other Other (See Comments)    Some antibiotics cause diarrhea  . Verapamil Other (See Comments)    Back pain  . Claritin [Loratadine] Other (See Comments)    unknown   Past Medical History:  Diagnosis Date  . Anesthesia complication requiring reversal agent administration    ? from central apnea, very difficult to get off vent  . Anxiety   . Arthritis    osteo  . Asthma   . BPH (benign prostatic hyperplasia)   . Complication of anesthesia    difficulty waking , they twlight me because of my respiratory problems "  . Dyspnea   . Enlarged heart   . Family history of adverse reaction to anesthesia    mother trouble waking up, and heart stopped  . GERD (gastroesophageal reflux disease)   . Headache    botox injections for headaches  . Hyperlipidemia   . Hypertension   . Hypogonadism male   . IBS (irritable bowel syndrome)   . Obesity   . OSA (obstructive sleep apnea)    cpap  . Pneumonia   . Pre-diabetes   . Prostatitis    Past Surgical History:  Procedure Laterality Date  . ABDOMINAL SURGERY    . ANKLE FRACTURE SURGERY Right   . CYSTOSCOPY     [place]  . KNEE ARTHROSCOPY WITH MEDIAL MENISECTOMY Left 01/02/2017   Procedure: LEFT KNEE ARTHROSCOPY WITH PARTIAL MEDIAL MENISCECTOMY;  Surgeon: Mcarthur Rossetti, MD;  Location: [facility];  Service: Orthopedics;  Laterality: Left;  . TONSILLECTOMY    . [place] RESECTION  2007  . UVULOPALATOPHARYNGOPLASTY     Review of Systems   10 point systems review negative except as above.  Objective:   Physical Exam  BP 140/80   Pulse (!) 110   Temp (!) 97.5 F (36.4 C)   Resp 18   Wt (!) 403 lb (182.8 kg)   SpO2 97%   BMI 54.66 kg/m   HEENT - WNL. Neck - supple.  Chest - Clear equal BS. Cor - Nl HS. RRR w/o sig MGR. PP 1(+). 1-2+ ankle /pretibial edema. Abd - massive redundant abdominal panniculus.  MS- FROM w/o deformities.  Gait Nl. Neuro -  Nl w/o focal abnormalities.    Assessment & Plan:   1. Essential hypertension  - COMPLETE METABOLIC PANEL WITH GFR - CBC with Differential/Platelet  2. Edema of both lower extremities due to peripheral venous insufficiency  - COMPLETE METABOLIC PANEL WITH GFR - CBC with Differential/Platelet  3. Medication management  - COMPLETE METABOLIC PANEL WITH GFR - CBC with Differential/Platelet

## 2018-01-05 ENCOUNTER — Telehealth (HOSPITAL_COMMUNITY): Payer: Self-pay | Admitting: Internal Medicine

## 2018-01-05 ENCOUNTER — Encounter (HOSPITAL_COMMUNITY): Payer: BLUE CROSS/BLUE SHIELD

## 2018-01-06 DIAGNOSIS — G43709 Chronic migraine without aura, not intractable, without status migrainosus: Secondary | ICD-10-CM | POA: Diagnosis not present

## 2018-01-07 ENCOUNTER — Encounter (HOSPITAL_COMMUNITY): Payer: BLUE CROSS/BLUE SHIELD

## 2018-01-12 ENCOUNTER — Encounter (HOSPITAL_COMMUNITY): Payer: BLUE CROSS/BLUE SHIELD

## 2018-01-14 ENCOUNTER — Encounter (HOSPITAL_COMMUNITY): Payer: BLUE CROSS/BLUE SHIELD

## 2018-01-18 DIAGNOSIS — G4733 Obstructive sleep apnea (adult) (pediatric): Secondary | ICD-10-CM | POA: Diagnosis not present

## 2018-01-19 ENCOUNTER — Encounter (HOSPITAL_COMMUNITY): Payer: BLUE CROSS/BLUE SHIELD

## 2018-01-21 ENCOUNTER — Encounter (HOSPITAL_COMMUNITY): Payer: BLUE CROSS/BLUE SHIELD

## 2018-01-23 ENCOUNTER — Other Ambulatory Visit: Payer: Self-pay | Admitting: Internal Medicine

## 2018-01-25 NOTE — Addendum Note (Signed)
Encounter addended by: Rowe Pavy, RN on: 01/25/2018 10:23 PM  Actions taken: Episode resolved, Flowsheet data copied forward, Visit Navigator Flowsheet section accepted, Sign clinical note

## 2018-01-25 NOTE — Progress Notes (Signed)
Discharge Progress Report  Patient Details  Name: NAJEEB UPTAIN MRN: [id number] Date of Birth: [birth year] Referring Provider:     Pulmonary Rehab Walk Test from 10/20/2017 in [facility] [place]  Referring Provider  Dr. [name]       Number of Visits: 7  Reason for Discharge:  Early Exit:  Pt discharged from pulmonary rehab due to non attendance despite being cleared to return to exercise while he awiats the completion of his cardiac consult.  Smoking History:  Social History   Tobacco Use  Smoking Status Former Smoker  . Packs/day: 0.10  . Years: 15.00  . Pack years: 1.50  Smokeless Tobacco Never Used  Tobacco Comment   significant second-hand exposure through mother    Diagnosis:  Severe asthma, unspecified whether complicated, unspecified whether persistent  ADL UCSD: Pulmonary Assessment Scores    Row Name 10/22/17 0902 11/03/17 1156       ADL UCSD   ADL Phase  Entry  Entry    SOB Score total  -  73      CAT Score   CAT Score  -  35      mMRC Score   mMRC Score  4  -       Initial Exercise Prescription: Initial Exercise Prescription - 10/22/17 0800      Date of Initial Exercise RX and Referring Provider   Date  10/20/17    Referring Provider  Dr. [name]      Oxygen   Oxygen  Continuous    Liters  2      NuStep   Level  1    SPM  80    Minutes  34    METs  1.5      Track   Laps  5    Minutes  17      Prescription Details   Frequency (times per week)  2    Duration  Progress to 45 minutes of aerobic exercise without signs/symptoms of physical distress      Intensity   THRR 40-80% of Max Heartrate  66-132    Ratings of Perceived Exertion  11-13    Perceived Dyspnea  0-4      Progression   Progression  Continue progressive overload as per policy without signs/symptoms or physical distress.      Resistance Training   Training Prescription  Yes    Weight  blue bands    Reps  10-15       Discharge Exercise  Prescription (Final Exercise Prescription Changes): Exercise Prescription Changes - 12/29/17 1700      Response to Exercise   Blood Pressure (Admit)  110/80    Blood Pressure (Exercise)  120/80    Blood Pressure (Exit)  108/80    Heart Rate (Admit)  101 bpm    Heart Rate (Exercise)  102 bpm    Heart Rate (Exit)  104 bpm    Oxygen Saturation (Admit)  94 %    Oxygen Saturation (Exercise)  93 %    Oxygen Saturation (Exit)  94 %    Rating of Perceived Exertion (Exercise)  16    Perceived Dyspnea (Exercise)  3    Duration  Progress to 45 minutes of aerobic exercise without signs/symptoms of physical distress    Intensity  THRR unchanged      Progression   Progression  Continue to progress workloads to maintain intensity without signs/symptoms of physical distress.  Resistance Training   Training Prescription  Yes    Weight  blue bands    Reps  10-15    Time  10 Minutes      Interval Training   Interval Training  No      Oxygen   Oxygen  Continuous    Liters  2      NuStep   Level  1    SPM  80    Minutes  51    METs  2.1       Functional Capacity: 6 Minute Walk    Row Name 10/22/17 0852         6 Minute Walk   Phase  Initial     Distance  600 feet     Walk Time  6 minutes     # of Rest Breaks  0     MPH  1.13     METS  1.84     RPE  13     Perceived Dyspnea   1     Symptoms  Yes (comment)     Comments  7/10 BACK PAIN     Resting HR  96 bpm     Resting BP  160/88     Resting Oxygen Saturation   89 %     Exercise Oxygen Saturation  during 6 min walk  87 %     Max Ex. HR  117 bpm     Max Ex. BP  140/94       Interval HR   1 Minute HR  97     2 Minute HR  107     3 Minute HR  106     4 Minute HR  111     5 Minute HR  117     6 Minute HR  108     2 Minute Post HR  95     Interval Heart Rate?  Yes       Interval Oxygen   Interval Oxygen?  Yes     Baseline Oxygen Saturation %  89 %     1 Minute Oxygen Saturation %  89 %     1 Minute Liters of  Oxygen  0 L     2 Minute Oxygen Saturation %  87 %     2 Minute Liters of Oxygen  0 L     3 Minute Oxygen Saturation %  89 %     3 Minute Liters of Oxygen  2 L     4 Minute Oxygen Saturation %  91 %     4 Minute Liters of Oxygen  2 L     5 Minute Oxygen Saturation %  90 %     5 Minute Liters of Oxygen  2 L     6 Minute Oxygen Saturation %  91 %     6 Minute Liters of Oxygen  2 L     2 Minute Post Oxygen Saturation %  95 %     2 Minute Post Liters of Oxygen  2 L        Psychological, QOL, Others - Outcomes: PHQ 2/9: Depression screen [facility] 2/9 10/16/2017 04/14/2017 10/04/2016 04/23/2016 08/13/2015  Decreased Interest 0 0 0 0 0  Down, Depressed, Hopeless 2 0 0 0 0  PHQ - 2 Score 2 0 0 0 0  Altered sleeping 3 - - - -  Tired, decreased energy 3 - - - -  Change in appetite 2 - - - -  Feeling bad or failure about yourself  1 - - - -  Trouble concentrating 2 - - - -  Moving slowly or fidgety/restless 1 - - - -  Suicidal thoughts 0 - - - -  PHQ-9 Score 14 - - - -  Difficult doing work/chores Somewhat difficult - - - -  Some recent data might be hidden    Quality of Life:   Personal Goals: Goals established at orientation with interventions provided to work toward goal. Personal Goals and Risk Factors at Admission - 10/16/17 1446      Core Components/Risk Factors/Patient Goals on Admission    Weight Management  Obesity;Yes    Intervention  Weight Management: Develop a combined nutrition and exercise program designed to reach desired caloric intake, while maintaining appropriate intake of nutrient and fiber, sodium and fats, and appropriate energy expenditure required for the weight goal.;Weight Management: Provide education and appropriate resources to help participant work on and attain dietary goals.;Weight Management/Obesity: Establish reasonable short term and long term weight goals.;Obesity: Provide education and appropriate resources to help participant work on and attain dietary  goals.    Goal Weight: Long Term  200 lb (90.7 kg)    Expected Outcomes  Short Term: Continue to assess and modify interventions until short term weight is achieved;Weight Maintenance: Understanding of the daily nutrition guidelines, which includes 25-35% calories from fat, 7% or less cal from saturated fats, less than '200mg'$  cholesterol, less than 1.5gm of sodium, & 5 or more servings of fruits and vegetables daily;Weight Loss: Understanding of general recommendations for a balanced deficit meal plan, which promotes 1-2 lb weight loss per week and includes a negative energy balance of [phone] kcal/d;Understanding recommendations for meals to include 15-35% energy as protein, 25-35% energy from fat, 35-60% energy from carbohydrates, less than '200mg'$  of dietary cholesterol, 20-35 gm of total fiber daily;Understanding of distribution of calorie intake throughout the day with the consumption of 4-5 meals/snacks    Improve shortness of breath with ADL's  Yes    Intervention  Provide education, individualized exercise plan and daily activity instruction to help decrease symptoms of SOB with activities of daily living.    Expected Outcomes  Short Term: Improve cardiorespiratory fitness to achieve a reduction of symptoms when performing ADLs;Long Term: Be able to perform more ADLs without symptoms or delay the onset of symptoms    Hypertension  Yes    Intervention  Provide education on lifestyle modifcations including regular physical activity/exercise, weight management, moderate sodium restriction and increased consumption of fresh fruit, vegetables, and low fat dairy, alcohol moderation, and smoking cessation.;Monitor prescription use compliance.    Expected Outcomes  Short Term: Continued assessment and intervention until BP is < 140/42m HG in hypertensive participants. < 130/867mHG in hypertensive participants with diabetes, heart failure or chronic kidney disease.;Long Term: Maintenance of blood pressure at  goal levels.    Lipids  Yes    Intervention  Provide education and support for participant on nutrition & aerobic/resistive exercise along with prescribed medications to achieve LDL '70mg'$ , HDL >'40mg'$ .    Expected Outcomes  Short Term: Participant states understanding of desired cholesterol values and is compliant with medications prescribed. Participant is following exercise prescription and nutrition guidelines.;Long Term: Cholesterol controlled with medications as prescribed, with individualized exercise RX and with personalized nutrition plan. Value goals: LDL < '70mg'$ , HDL > 40 mg.    Stress  Yes   family stress, mother has  dementia, father has bone marrow disease, sister is on drugs   Intervention  Offer individual and/or small group education and counseling on adjustment to heart disease, stress management and health-related lifestyle change. Teach and support self-help strategies.    Expected Outcomes  Short Term: Participant demonstrates changes in health-related behavior, relaxation and other stress management skills, ability to obtain effective social support, and compliance with psychotropic medications if prescribed.        Personal Goals Discharge: Goals and Risk Factor Review    Row Name 10/27/17 1241 11/24/17 1051 12/15/17 0825 01/25/18 2210       Core Components/Risk Factors/Patient Goals Review   Personal Goals Review  Weight Management/Obesity;Develop more efficient breathing techniques such as purse lipped breathing and diaphragmatic breathing and practicing self-pacing with activity.;Improve shortness of breath with ADL's;Lipids;Hypertension;Stress  Weight Management/Obesity;Improve shortness of breath with ADL's;Hypertension;Lipids;Stress  Weight Management/Obesity;Improve shortness of breath with ADL's;Lipids;Stress  Weight Management/Obesity;Improve shortness of breath with ADL's;Lipids;Stress    Review  Pt to begin exercise today.  Unable to assess at this time  has attended 2  exercise sessions, is being treated by Dr. Melford Aase for lower back pain/sciatica with prednisone, he has been absent since 10/31/17, unable to contact or leave a message  Attended 6 exercise sessions after receiving streriod injections by Dr. Melford Aase however pt is on hold presently due to presenting to ER with cp. Unfortunately pt did not stay for this ER visit.  Scheduled pt for follow up with primary MD on 7/23. Pt weight shows decrease of 1.4 kg since begining Pulmonary Rehab.  Pt has not been able to engage in consistent exercise due to overall deconditioning  and lower back pain.  Pt  observed using PLB techniques but this is difficult due to his large abdomen area which decreases his lung capacity. Pt with good management of his bp and has the expected  increase on exertion. Will resolve this patient goal.  Lipids have elevation in triglycerides pt encouraged to work on this with diet.  will continue to montior this in the next 30 days for any new lab work.  Pt continue to have stress as it relates to his inability to fully engage in exercise.  Hopeful that pt will be cleared to return and on review for the next 30 days show some progress.   Attended 7 exercise sessions with last exercise sessin completed on 7/18.pt discharged due to non attendance despite being cleared to return to exercise.  Pt is to [place] with cardiologist due to coplaint of chest pain at rest and on exertiorn. Pt weight shows decrease of 2.4 kg since begining Pulmonary Rehab.  Pt has not been able to engage in consistent exercise due to overall deconditioning  and lower back pain.  Pt  observed using PLB techniques but this is difficult due to his large abdomen area which decreases his lung capacity.   Lipids have elevation in triglycerides pt encouraged to work on this with diet. Pt continue to have stress as it relates to his inability to fully engage in exercise. Unknown what exercise is he doing at home    Expected Outcomes  See  "admission outcomes"  See "admission outcomes"  See Admission Goals  Pt has met most of his admission goals.  pt continues to work on his stress level.       Exercise Goals and Review: Exercise Goals    Row Name 10/16/17 1459  Exercise Goals   Increase Physical Activity  Yes       Intervention  Provide advice, education, support and counseling about physical activity/exercise needs.;Develop an individualized exercise prescription for aerobic and resistive training based on initial evaluation findings, risk stratification, comorbidities and participant's personal goals.       Expected Outcomes  Short Term: Attend rehab on a regular basis to increase amount of physical activity.;Long Term: Add in home exercise to make exercise part of routine and to increase amount of physical activity.;Long Term: Exercising regularly at least 3-5 days a week.       Increase Strength and Stamina  Yes       Intervention  Provide advice, education, support and counseling about physical activity/exercise needs.;Develop an individualized exercise prescription for aerobic and resistive training based on initial evaluation findings, risk stratification, comorbidities and participant's personal goals.       Expected Outcomes  Short Term: Increase workloads from initial exercise prescription for resistance, speed, and METs.;Short Term: Perform resistance training exercises routinely during rehab and add in resistance training at home;Long Term: Improve cardiorespiratory fitness, muscular endurance and strength as measured by increased METs and functional capacity (6MWT)       Able to understand and use rate of perceived exertion (RPE) scale  Yes       Intervention  Provide education and explanation on how to use RPE scale       Expected Outcomes  Short Term: Able to use RPE daily in rehab to express subjective intensity level;Long Term:  Able to use RPE to guide intensity level when exercising independently        Able to understand and use Dyspnea scale  Yes       Intervention  Provide education and explanation on how to use Dyspnea scale       Expected Outcomes  Short Term: Able to use Dyspnea scale daily in rehab to express subjective sense of shortness of breath during exertion;Long Term: Able to use Dyspnea scale to guide intensity level when exercising independently       Intervention  Provide education and explanation of THRR including how the numbers were predicted and where they are located for reference       Expected Outcomes  Long Term: Able to use THRR to govern intensity when exercising independently;Short Term: Able to state/look up THRR;Short Term: Able to use daily as guideline for intensity in rehab       Understanding of Exercise Prescription  Yes       Intervention  Provide education, explanation, and written materials on patient's individual exercise prescription       Expected Outcomes  Short Term: Able to explain program exercise prescription;Long Term: Able to explain home exercise prescription to exercise independently          Nutrition & Weight - Outcomes:    Nutrition: Nutrition Therapy & Goals - 12/08/17 1530      Nutrition Therapy   Diet  carb modified      Personal Nutrition Goals   Nutrition Goal  Identify food quantities necessary to achieve wt loss of  -2# per week to a goal wt loss of 6-24 lb at graduation from pulmonary rehab.    Personal Goal #2  Pt able to name foods that affect blood glucose       Intervention Plan   Intervention  Prescribe, educate and counsel regarding individualized specific dietary modifications aiming towards targeted core components such as weight, hypertension, lipid management,  diabetes, heart failure and other comorbidities.    Expected Outcomes  Short Term Goal: Understand basic principles of dietary content, such as calories, fat, sodium, cholesterol and nutrients.       Nutrition Discharge: Nutrition Assessments - 01/14/18  1150      Rate Your Plate Scores   Pre Score  52    Post Score  --   pt did not return      Education Questionnaire Score: Knowledge Questionnaire Score - 11/03/17 1156      Knowledge Questionnaire Score   Pre Score  18/18      Pt discharged due to non attendance.  Pt did not complete any post assessment evaluations. Cherre Huger, BSN Cardiac and Training and development officer

## 2018-01-26 ENCOUNTER — Encounter (HOSPITAL_COMMUNITY): Payer: BLUE CROSS/BLUE SHIELD

## 2018-01-28 ENCOUNTER — Encounter (HOSPITAL_COMMUNITY): Payer: BLUE CROSS/BLUE SHIELD

## 2018-01-29 DIAGNOSIS — M5416 Radiculopathy, lumbar region: Secondary | ICD-10-CM | POA: Diagnosis not present

## 2018-01-29 DIAGNOSIS — M4722 Other spondylosis with radiculopathy, cervical region: Secondary | ICD-10-CM | POA: Diagnosis not present

## 2018-01-29 DIAGNOSIS — R413 Other amnesia: Secondary | ICD-10-CM | POA: Diagnosis not present

## 2018-02-02 ENCOUNTER — Ambulatory Visit (INDEPENDENT_AMBULATORY_CARE_PROVIDER_SITE_OTHER): Payer: BLUE CROSS/BLUE SHIELD | Admitting: Internal Medicine

## 2018-02-02 ENCOUNTER — Encounter: Payer: Self-pay | Admitting: Internal Medicine

## 2018-02-02 VITALS — BP 128/66 | HR 101 | Temp 98.4°F | Resp 18 | Ht 72.5 in | Wt >= 6400 oz

## 2018-02-02 DIAGNOSIS — I10 Essential (primary) hypertension: Secondary | ICD-10-CM | POA: Diagnosis not present

## 2018-02-02 DIAGNOSIS — I872 Venous insufficiency (chronic) (peripheral): Secondary | ICD-10-CM | POA: Diagnosis not present

## 2018-02-02 DIAGNOSIS — Z79899 Other long term (current) drug therapy: Secondary | ICD-10-CM | POA: Diagnosis not present

## 2018-02-02 MED ORDER — FUROSEMIDE 80 MG PO TABS: ORAL_TABLET | ORAL | 0 refills | Status: DC

## 2018-02-02 NOTE — Progress Notes (Addendum)
Subjective:    Patient ID: Mark Harrell, male    DOB: Mar 25, 1961, 57 y.o.   MRN: [id number]  HPI    This very nice 57 yo single WM with HTN and  Severe Malignant Morbid Obesity (BMI 55+) returns fr 1 month f/u of more aggressive diuresis and altho he feels improved, he still reports mild to moderate edema persists. Note he did have a backslide with sl weight gain since last OV. He has chronic DOE attributed to his severe obesity & deconditioning. Major issue for this patient is his polypharmacy of 40+ meds & inhalers - many of which he probably could do without.    Wt Readings from Last 3 Encounters:  02/02/18 (!) 406 lb (184.2 kg)  12/31/17 (!) 403 lb (182.8 kg)  12/29/17 (!) 399 lb 0.5 oz (181 kg)   Medication Sig  . albuterol  HFA 1 to 2 inhalations 10 to 15 minutes apart every 4 hrs to rescue Asthma  . albuterol   0.083% neb son Inhale 2.5 mg into the lungs every 4 (four) hours as needed.  [name] alfuzosin  10 MG 24 hr Take 10 mg by mouth daily.    . baclofen  10 MG tablet Take 10 mg by mouth 3 (three) times daily as needed for muscle spasms.         TAKE 1 TABLET BY MOUTH EVERY DAY  . CALCIUM + D  Take 500 mg by mouth 3 (three) times daily.   . cetaphil (CETAPHIL) lotion Apply topically 2 (two) times daily.  . cetirizine  10 MG tablet Take 10 mg by mouth daily.  Amanda Cockayne 10-8 MG/5ML  Take 5 mLs every 12 (twelve) hours by mouth.  [name] VITAMIN D  Take 10,000-20,000 Units by mouth 2 (two) times daily. 20000 in the morning and 10000 in the evening  . LOTRISONE cream APPLY TO AFFECTED AREA TWICE A DAY  . co-enzyme Q-10 30 MG  Take 30 mg by mouth daily.  . cyclobenzaprine (FLEXERIL) 10 MG tablet Take 10 mg by mouth 3 (three) times daily.   [name] desmopressin (DDAVP) 0.2 MG tablet Take 200 mcg by mouth daily.   . diclofenac (VOLTAREN) 75 MG EC tablet Take 1 tablet (75 mg total) by mouth 2 (two) times daily.  . diclofenac sodium (VOLTAREN) 1 % GEL APPLY 4 GRAMS TOPICALLY 4 TIMES DAILY  . donepezil  (ARICEPT) 23 MG TABS tablet Take 23 mg by mouth at bedtime.  . Dupilumab, Asthma, (DUPIXENT) 200 MG/1.14ML SOSY Inject into the skin. Injects bi-weekly  . DYMISTA 137-50 MCG/ACT SUSP PLACE 2 SPRAYS INTO BOTH NOSTRILS 2 (TWO) TIMES DAILY.  [name] Eluxadoline (VIBERZI) 100 MG TABS Take 1 tablet by mouth 2 (two) times daily.   . Fluticasone-Umeclidin-Vilant (TRELEGY ELLIPTA) 100-62.5-25 MCG/INH AEPB Inhale 1 puff into the lungs daily.  [name] Galcanezumab-gnlm (EMGALITY) 120 MG/ML SOAJ Inject into the skin. Pt receives this monthly ingection  . GLUCOSAMINE-CHONDROITIN PO Take 3 tablets by mouth daily.  [name] guaiFENesin (MUCINEX) 600 MG 12 hr tablet Take 600 mg by mouth 2 (two) times daily as needed for cough or to loosen phlegm. Instructed to take 3 tablets in the morning  . HYDROcodone-homatropine (HYCODAN) 5-1.5 MG/5ML syrup Take 5 mLs by mouth every 6 (six) hours as needed for cough. Do not take with other opioids such as tramadol.  . LUTEIN PO Take 1 tablet by mouth 2 (two) times daily.  [name] LYRICA 150 MG capsule Take 300 mg by mouth 2 (two)  times daily.  . memantine (NAMENDA) 10 MG tablet Take 10 mg by mouth 2 (two) times daily.   . Methylfol-Algae-B12-Acetylcyst (CEREFOLIN NAC) 6-90.314-2-600 MG TABS Take 1 tablet by mouth daily.   . montelukast (SINGULAIR) 10 MG tablet TAKE 1 TABLET BY MOUTH EVERY DAY  . Multiple Vitamins-Minerals (MULTIVITAMIN WITH MINERALS) tablet Take 1 tablet by mouth daily.  . ondansetron (ZOFRAN-ODT) 4 MG disintegrating tablet TAKE 1 TABLET (4 MG TOTAL) BY MOUTH EVERY 6 (SIX) HOURS AS NEEDED FOR NAUSEA OR VOMITING.  [name] oxyCODONE-acetaminophen (PERCOCET) 10-325 MG tablet Take 1 tablet by mouth every 4 (four) hours as needed for pain.  [name] oxymetazoline (AFRIN) 0.05 % nasal spray Place 1 spray into both nostrils See admin instructions. 1 SPRAY PER SIDE EACH NIGHT BEFORE DYMISTA  . pentosan polysulfate (ELMIRON) 100 MG capsule Take 100 mg by mouth 3 (three) times daily before meals.   .  Phosphatidylserine-DHA-EPA (VAYACOG PO) Take 1 capsule by mouth 2 (two) times daily.  . Potassium 99 MG TABS Take 1 tablet by mouth 3 (three) times daily.   . pravastatin (PRAVACHOL) 40 MG tablet TAKE 1 TABLET BY MOUTH AT BEDTIME (Patient taking differently: TAKE 1/2 TABLET BY MOUTH AT BEDTIME)  . PRESCRIPTION MEDICATION Pt receives weekly allergy shots  . ranitidine (ZANTAC) 150 MG capsule Take 1 capsule (150 mg total) by mouth 2 (two) times daily.  . sodium chloride (OCEAN) 0.65 % SOLN nasal spray Place 1 spray into both nostrils 4 (four) times daily as needed for congestion. Uses each time before other nasal sprays  . tadalafil (CIALIS) 5 MG tablet Take 5 mg by mouth every evening.   . traMADol (ULTRAM) 50 MG tablet TAKE 1 TABLET EVERY 6 HOURS AS NEEDED FOR COUGH  . triamcinolone cream (KENALOG) 0.1 % APPLY TOPICALLY 4 TIMES A DAY (Patient taking differently: APPLY TOPICALLY 2 TIMES A DAY AS NEEDED for itching)  . trospium (SANCTURA) 20 MG tablet Take 20 mg by mouth 2 (two) times daily.   . TURMERIC PO Take 3 capsules by mouth daily.  . furosemide (LASIX) 40 MG tablet TAKE 1 TABLET TWICE A DAY FOR FLUID RETENTION AND SWELLING OF LEGS (Patient taking differently: Take 120 mg by mouth every morning. TAKE 1 TABLET TWICE A DAY FOR FLUID RETENTION AND SWELLING OF LEGS)  . predniSONE (DELTASONE) 20 MG tablet 1 tab 3 x day for 3 days, then 1 tab 2 x day for 3 days, then 1 tab 1 x day for 5 days (Patient not taking: Reported on 02/02/2018)   Facility-Administered Medications Prior to Visit  Medication Dose Route Frequency Provider Last Rate Last Dose  . Mepolizumab SOLR 100 mg  100 mg Subcutaneous Q28 days [name], MD   100 mg at 05/11/17 1546   Allergies  Allergen Reactions  . Bee Venom Swelling  . Ppd [Tuberculin Purified Protein Derivative] Other (See Comments)    +ppd NEG Quantferron Gold 3/13  . Fenofibrate Other (See Comments)    Back pain  . Levofloxacin Diarrhea  . Other  Other (See Comments)    Some antibiotics cause diarrhea  . Verapamil Other (See Comments)    Back pain  . Claritin [Loratadine] Other (See Comments)    unknown      Review of Systems     Objective:   Physical Exam  BP 128/66   Pulse (!) 101   Temp 98.4 F (36.9 C)   Resp 18   Ht 6' 0.5" (1.842 m)   [facility] [name])  406 lb (184.2 kg)   SpO2 97%   BMI 54.31 kg/m   HEENT - WNL. Neck - supple.  Chest - Clear equal BS. Cor - Nl HS. RRR w/o sig MGR. PP 1(+). No edema. MS- FROM w/o deformities.  Gait Nl. Neuro -  Nl w/o focal abnormalities.   Assessment & Plan:   1. Essential hypertension  - CBC with Differential/Platelet - COMPLETE METABOLIC PANEL WITH GFR - Magnesium  2. Edema of both lower extremities due to peripheral venous insufficiency  - COMPLETE METABOLIC PANEL WITH GFR - furosemide (LASIX) 80 MG tablet; Take 1 to 1 & 1/2 tablets 2 x /day for Fluid Retention & Swelling of Legs  Dispense: 270 tablet; Refill: 0  3. Medication management  - CBC with Differential/Platelet - COMPLETE METABOLIC PANEL WITH GFR - Magnesium

## 2018-02-03 ENCOUNTER — Encounter: Payer: Self-pay | Admitting: Internal Medicine

## 2018-02-03 LAB — CBC WITH DIFFERENTIAL/PLATELET
BASOS ABS: 91 {cells}/uL (ref 0–200)
Basophils Relative: 1.4 %
EOS ABS: 163 {cells}/uL (ref 15–500)
Eosinophils Relative: 2.5 %
HEMATOCRIT: 48.5 % (ref 38.5–50.0)
HEMOGLOBIN: 16.2 g/dL (ref 13.2–17.1)
LYMPHS ABS: 2399 {cells}/uL (ref 850–3900)
MCH: 29.1 pg (ref 27.0–33.0)
MCHC: 33.4 g/dL (ref 32.0–36.0)
MCV: 87.2 fL (ref 80.0–100.0)
MPV: 11.3 fL (ref 7.5–12.5)
Monocytes Relative: 14 %
Neutro Abs: 2938 cells/uL (ref 1500–7800)
Neutrophils Relative %: 45.2 %
Platelets: 240 10*3/uL (ref 140–400)
RBC: 5.56 10*6/uL (ref 4.20–5.80)
RDW: 13.9 % (ref 11.0–15.0)
Total Lymphocyte: 36.9 %
WBC: 6.5 10*3/uL (ref 3.8–10.8)
WBCMIX: 910 {cells}/uL (ref 200–950)

## 2018-02-03 LAB — COMPLETE METABOLIC PANEL WITH GFR
AG RATIO: 1.9 (calc) (ref 1.0–2.5)
ALBUMIN MSPROF: 3.9 g/dL (ref 3.6–5.1)
ALKALINE PHOSPHATASE (APISO): 59 U/L (ref 40–115)
ALT: 33 U/L (ref 9–46)
AST: 23 U/L (ref 10–35)
BUN: 12 mg/dL (ref 7–25)
CO2: 32 mmol/L (ref 20–32)
Calcium: 9 mg/dL (ref 8.6–10.3)
Chloride: 102 mmol/L (ref 98–110)
Creat: 0.88 mg/dL (ref 0.70–1.33)
GFR, Est African American: 111 mL/min/{1.73_m2} (ref >=60)
GFR, Est Non African American: 95 mL/min/{1.73_m2} (ref >=60)
GLOBULIN: 2.1 g/dL (ref 1.9–3.7)
Glucose, Bld: 99 mg/dL (ref 65–99)
Potassium: 4.1 mmol/L (ref 3.5–5.3)
SODIUM: 143 mmol/L (ref 135–146)
Total Bilirubin: 1.7 mg/dL — ABNORMAL HIGH (ref 0.2–1.2)
Total Protein: 6 g/dL — ABNORMAL LOW (ref 6.1–8.1)

## 2018-02-03 LAB — MAGNESIUM: MAGNESIUM: 1.8 mg/dL (ref 1.5–2.5)

## 2018-02-10 ENCOUNTER — Ambulatory Visit: Payer: BLUE CROSS/BLUE SHIELD | Admitting: Allergy & Immunology

## 2018-02-10 ENCOUNTER — Encounter: Payer: Self-pay | Admitting: Allergy & Immunology

## 2018-02-10 VITALS — BP 112/68 | HR 95 | Resp 16 | Ht 72.0 in | Wt >= 6400 oz

## 2018-02-10 DIAGNOSIS — B999 Unspecified infectious disease: Secondary | ICD-10-CM

## 2018-02-10 DIAGNOSIS — J449 Chronic obstructive pulmonary disease, unspecified: Secondary | ICD-10-CM | POA: Diagnosis not present

## 2018-02-10 DIAGNOSIS — Z91030 Bee allergy status: Secondary | ICD-10-CM | POA: Diagnosis not present

## 2018-02-10 DIAGNOSIS — Z91038 Other insect allergy status: Secondary | ICD-10-CM

## 2018-02-10 DIAGNOSIS — L2084 Intrinsic (allergic) eczema: Secondary | ICD-10-CM

## 2018-02-10 DIAGNOSIS — J302 Other seasonal allergic rhinitis: Secondary | ICD-10-CM

## 2018-02-10 DIAGNOSIS — K219 Gastro-esophageal reflux disease without esophagitis: Secondary | ICD-10-CM

## 2018-02-10 DIAGNOSIS — J3089 Other allergic rhinitis: Secondary | ICD-10-CM | POA: Diagnosis not present

## 2018-02-10 MED ORDER — DOXYCYCLINE HYCLATE 100 MG PO CAPS: 100.0000 mg | ORAL_CAPSULE | Freq: Two times a day (BID) | ORAL | 3 refills | Status: AC

## 2018-02-10 NOTE — Patient Instructions (Addendum)
1. 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  - If this does not work, we will apply again for the immunoglobulin replacement.   2. Asthma-COPD overlap syndrome - Lung testing looked stable today.  - Daily controller medication(s): Dupixent 300mg  every two weeks and Trelegy 100/62.5/25 one puff once daily - Prior to physical activity: ProAir 2 puffs 10-15 minutes before physical activity. - Rescue medications: ProAir 4 puffs every 4-6 hours as needed or albuterol nebulizer one vial every 4-6 hours as needed - Asthma control goals:  * Full participation in all desired activities (may need albuterol before activity) * Albuterol use two time or less a week on average (not counting use with activity) * Cough interfering with sleep two time or less a month * Oral steroids no more than once a year * No hospitalizations  3. Chronic allergic rhinitis (sweet vernal grass, box elder, cat, weeds, ragweed, molds, cockroach, dust mite) - Continue with Dymista 2 sprays per nostril 1-2 times daily. - Use Afrin 1-2 sprays prior to the Dymista at night.  - Continue with saline mist 1-2 times daily.  - Use cetirizine 10mg  daily as needed for breakthrough symptoms. - Resume allergy shots once you have gotten more stabilized from a respiratory perspective.     4. Sleep apnea - Continue with oxygen at night.  - Make a follow up appointment with Dr. Lucia Gaskins [phone]).  5. GERD - We will send in a prescription for ranitidine 150mg  daily.   6. Return in about 3 months (around 05/12/2018).    Please inform us of any Emergency Department visits, hospitalizations, or changes in symptoms. Call us before going to the ED for breathing or allergy symptoms since we might be able to fit you in for a sick visit. Feel free to contact us anytime with any questions, problems, or  concerns.  It was a pleasure to see you again today!  Websites that have reliable patient information: 1. American Academy of Asthma, Allergy, and Immunology: www.aaaai.org 2. Food Allergy Research and Education (FARE): foodallergy.org 3. Mothers of Asthmatics: http://www.asthmacommunitynetwork.org 4. American College of Allergy, Asthma, and Immunology: MonthlyElectricBill.co.uk   Make sure you are registered to vote! If you have moved or changed any of your contact information, you will need to get this updated before voting!

## 2018-02-10 NOTE — Progress Notes (Signed)
FOLLOW UP  Date of Service/Encounter:  02/10/18   Assessment:   Non-seasonal allergic rhinitis(sweet vernal grass, box elder, cat, weeds, ragweed, molds, cockroach, dust mite)- on allergen immunotherapy  Recurrent infections - restarting prophylactic doxycycline today  Asthma-COPD overlap syndrome- on Dupixent  Hymenoptera allergy  GERD - on Nexiumand ranitidine  Obesity - with resulting nerve impingement and joint pain  Migraines - on botulinum and Emgality injections  Polypharmacy    Mark Harrell is a 57 year old gentleman with multiple comorbidities including severe persistent asthma, allergic rhinitis, and recurrent infections presenting for a follow-up visit.  At the last visit, we started prophylactic doxycycline as a bridge to get immunoglobulin replacement approved; unfortunately he only took this for one month before stopping. After stopping, he did have a pulmonary infection that required prednisone and antibiotics. Thankfully, he has not had any problems requiring hospitalizations. We will restart the doxycycline today to see if this helps.   From an asthma-COPD overlap perspective he seems to be doing well on the [place], although I am unsure that we have given this enough time to fully realize whether it is working better than the [place]. He does have a history of sleep apnea, and has not been using his CPAP machine.  This likely has led to increased weight gain as well as fatigue. He is in cardiopulmonary rehab, which he is optimistic about.  He remains interested in a bariatric program afterwards. , He has reinstated his care with [facility].    Plan/Recommendations:   1. 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  - If this does not work, we will apply again for the immunoglobulin replacement.   2. Asthma-COPD  overlap syndrome - Lung testing looked stable today.  - Daily controller medication(s): Dupixent 300mg  every two weeks and Trelegy 100/62.5/25 one puff once daily - Prior to physical activity: ProAir 2 puffs 10-15 minutes before physical activity. - Rescue medications: ProAir 4 puffs every 4-6 hours as needed or albuterol nebulizer one vial every 4-6 hours as needed - Asthma control goals:  * Full participation in all desired activities (may need albuterol before activity) * Albuterol use two time or less a week on average (not counting use with activity) * Cough interfering with sleep two time or less a month * Oral steroids no more than once a year * No hospitalizations  3. Chronic allergic rhinitis (sweet vernal grass, box elder, cat, weeds, ragweed, molds, cockroach, dust mite) - Continue with Dymista 2 sprays per nostril 1-2 times daily. - Use Afrin 1-2 sprays prior to the Dymista at night.  - Continue with saline mist 1-2 times daily.  - Use cetirizine 10mg  daily as needed for breakthrough symptoms. - Resume allergy shots once you have gotten more stabilized from a respiratory perspective.     4. Sleep apnea - Continue with oxygen at night.  - Make a follow up appointment with Dr. Lucia Gaskins [phone]  5. GERD - We will send in a prescription for ranitidine 150mg  daily.   6. Return in about 3 months (around 05/12/2018).  Subjective:   Mark Harrell is a 57 y.o. male presenting today for follow up of  Chief Complaint  Patient presents with  . Asthma    Mark Harrell has a history of the following: Patient Active Problem List   Diagnosis Date Noted  . Bilateral  lower extremity edema 12/29/2017  . Hymenoptera allergy 11/10/2017  . Seasonal and perennial allergic rhinitis 04/23/2017  . 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 Cervicalgia 04/01/2017  . Headache 11/13/2016  . Migraine without aura and without status migrainosus, not intractable 10/30/2016  . Irritant contact  dermatitis due to frequent handwashing 06/08/2016  . Lumbar radiculopathy 05/15/2016  . Osteoarthritis of spine with radiculopathy, lumbar region 05/15/2016  . Risk for falls 05/15/2016  . Acute medial meniscal tear, left, subsequent encounter 04/23/2016  . Recurrent infections 03/29/2016  . Chronic nonseasonal allergic rhinitis due to pollen 03/29/2016  . Polypharmacy 01/16/2016  . Morbid obesity with BMI of 50.0-59.9, adult ([place]) 03/06/2015  . SDAT 02/05/2015  . Asthma 02/05/2015  . OSA and COPD overlap syndrome ([place]) 02/05/2015  . Medication management 08/02/2014  . GERD (gastroesophageal reflux disease) 05/09/2014  . Vitamin D deficiency 08/01/2013  . Prediabetes 08/01/2013  . 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 Diverticula of colon 05/07/2011  . Hypertension 01/31/2011  . Hyperlipidemia, mixed 01/31/2011  . BPH (benign prostatic hyperplasia) 01/31/2011  . Testosterone Deficiency 01/31/2011  . IBS (irritable bowel syndrome) 01/31/2011  . Partial complex seizure disorder with intractable epilepsy ([place]) 01/31/2011  . Depression, major, recurrent, in partial remission ([place]) 01/31/2011  . Asthma-COPD overlap syndrome ([place]) 01/31/2011    History obtained from: chart review and patient.  Mark Harrell's Primary Care Provider is Mark Pinto, MD.     Mark Harrell is a 57 y.o. male presenting for a follow up visit. He was last seen in June 2019. At that time, he was not feeling well and we deferred on breathing evaluations. We continued him on Dupixent every two weeks as well as Trelegy one puff once daily. Rhinitis was somewhat controlled with Dymista and Afrin as well as cetirizine and Karbinal. We had plans to restart allergy shots once he was more stable from a respiratory perspective. We did refer him to see Dr Lucia Gaskins for possible surgery due to nasal congestion.   Since the last visit, Mark Harrell has done well. Mark Harrell is doing okay today. He reports that about 2 months ago he started  having a productive cough, increased wheezing and shortness of breath, with some chest tightness. He was given antibiotics and prednisone at that time and he reports that he has had some improvement.   He also reports that about 6-7 weeks ago he had an episode of chest pain that occurred after he left the cardiopulmonary rehab and while he was waiting in his car. He states that he had a severe, stabbing chest pain that lasted for about 5 minutes, so he went to the emergency room. He had blood tests and an EKG and he was told it was not a heart attack at that time. He was instead diagnosed with esophageal spasms.   He was told that this was likely an esophageal spasm. He has had follow up with the cardiologist who did a cardiac work-up and the pulmonologist who recommended restarting his home oxygen at night and as needed during the day. He is currently on 2L oxygen.   As far as his allergy symptoms go he is still using dymista, afrin, and the saline spray. He reports that he still has sneezing, itchy/watery eyes, and runny nose on a daily basis. He reports that medications helps when he takes them.  For his Asthma-COPD overlap, he reports that he has been taking his Trelegy daily, uses his nebulizer about 1/one time per week, and rarely uses  his ProAir. He is still on the Dupixent every 2 weeks. He does have dyspnea on exertion with minimal exertion and is on oxygen as noted above. He was started on prophylactic antibiotics, doxycycline, at his last visit but only completed a 30 day course due to some confusion about how long he was supposed to take it. He was in Pulmonary Rehab, but   He has not needed any hospitalizations since the last visit. Otherwise, there have been no changes to his past medical history, surgical history, family history, or social history.    Review of Systems: a 14-point review of systems is pertinent for what is mentioned in HPI.  Positive for fatigue, otherwise, all other  systems were negative. Constitutional: negative other than that listed in the HPI Eyes: negative other than that listed in the HPI Ears, nose, mouth, throat, and face: negative other than that listed in the HPI Respiratory: positive for SOB, negative other than that listed in the HPI Cardiovascular: negative other than that listed in the HPI Gastrointestinal: negative other than that listed in the HPI Genitourinary: negative other than that listed in the HPI Integument: negative other than that listed in the HPI Hematologic: negative other than that listed in the HPI Musculoskeletal: negative other than that listed in the HPI Neurological: negative other than that listed in the HPI Allergy/Immunologic: negative other than that listed in the HPI    Objective:   Blood pressure 112/68, pulse 95, resp. rate 16, height 6' (1.829 m), weight (!) 403 lb 3.2 oz (182.9 kg), SpO2 90 %. Body mass index is 54.68 kg/m.   Physical Exam:  General: Alert, interactive, in no acute distress. Pleasant and talkative.  Eyes: No conjunctival injection bilaterally, no discharge on the right, no discharge on the left, no Horner-Trantas dots present and allergic shiners present bilaterally. PERRL bilaterally. EOMI without pain. No photophobia.  Ears: Right TM pearly gray with normal light reflex, Left TM pearly gray with normal light reflex, Right TM intact without perforation and Left TM intact without perforation.  Nose/Throat: External nose within normal limits, nasal crease present and septum midline. Turbinates edematous and pale with clear discharge. Posterior oropharynx mildly erythematous without cobblestoning in the posterior oropharynx. Tonsils 2+ without exudates.  Tongue without thrush. Lungs: Clear to auscultation without wheezing, rhonchi or rales. No increased work of breathing. CV: Normal S1/S2. No murmurs. Capillary refill <2 seconds.  Skin: Warm and dry, without lesions or rashes. Neuro:    Grossly intact. No focal deficits appreciated. Responsive to questions.  Diagnostic studies:   Spirometry: results abnormal (FEV1: 2.40/60%, FVC: 3.51/67%, FEV1/FVC: 68%).    Spirometry consistent with mixed obstructive and restrictive disease. Overall values are stable compared to his last visit.    Allergy Studies: none      [name], MD  [facility]

## 2018-02-17 ENCOUNTER — Other Ambulatory Visit: Payer: Self-pay | Admitting: *Deleted

## 2018-02-17 MED ORDER — METOLAZONE 5 MG PO TABS: 5.0000 mg | ORAL_TABLET | Freq: Every day | ORAL | 0 refills | Status: DC

## 2018-02-17 MED ORDER — GABAPENTIN 800 MG PO TABS: 800.0000 mg | ORAL_TABLET | Freq: Three times a day (TID) | ORAL | 1 refills | Status: DC

## 2018-02-18 DIAGNOSIS — G4733 Obstructive sleep apnea (adult) (pediatric): Secondary | ICD-10-CM | POA: Diagnosis not present

## 2018-02-21 ENCOUNTER — Other Ambulatory Visit: Payer: Self-pay | Admitting: Internal Medicine

## 2018-02-22 ENCOUNTER — Other Ambulatory Visit: Payer: Self-pay | Admitting: *Deleted

## 2018-02-22 MED ORDER — PRAVASTATIN SODIUM 40 MG PO TABS: ORAL_TABLET | ORAL | 1 refills | Status: DC

## 2018-02-24 ENCOUNTER — Ambulatory Visit: Payer: BLUE CROSS/BLUE SHIELD | Admitting: Internal Medicine

## 2018-02-24 ENCOUNTER — Ambulatory Visit: Payer: BLUE CROSS/BLUE SHIELD | Admitting: Allergy & Immunology

## 2018-02-24 ENCOUNTER — Encounter: Payer: Self-pay | Admitting: Allergy & Immunology

## 2018-02-24 ENCOUNTER — Encounter: Payer: Self-pay | Admitting: Internal Medicine

## 2018-02-24 VITALS — BP 104/64 | HR 90 | Temp 98.6°F | Resp 12

## 2018-02-24 VITALS — BP 138/80 | HR 88 | Temp 97.3°F | Resp 18 | Ht 72.0 in | Wt >= 6400 oz

## 2018-02-24 DIAGNOSIS — I872 Venous insufficiency (chronic) (peripheral): Secondary | ICD-10-CM

## 2018-02-24 DIAGNOSIS — B999 Unspecified infectious disease: Secondary | ICD-10-CM

## 2018-02-24 DIAGNOSIS — I10 Essential (primary) hypertension: Secondary | ICD-10-CM | POA: Diagnosis not present

## 2018-02-24 DIAGNOSIS — L2084 Intrinsic (allergic) eczema: Secondary | ICD-10-CM

## 2018-02-24 DIAGNOSIS — J4541 Moderate persistent asthma with (acute) exacerbation: Secondary | ICD-10-CM

## 2018-02-24 DIAGNOSIS — K219 Gastro-esophageal reflux disease without esophagitis: Secondary | ICD-10-CM

## 2018-02-24 DIAGNOSIS — J302 Other seasonal allergic rhinitis: Secondary | ICD-10-CM

## 2018-02-24 DIAGNOSIS — J4540 Moderate persistent asthma, uncomplicated: Secondary | ICD-10-CM

## 2018-02-24 DIAGNOSIS — Z79899 Other long term (current) drug therapy: Secondary | ICD-10-CM

## 2018-02-24 DIAGNOSIS — J3089 Other allergic rhinitis: Secondary | ICD-10-CM | POA: Diagnosis not present

## 2018-02-24 MED ORDER — LEVOFLOXACIN 500 MG PO TABS: 500.0000 mg | ORAL_TABLET | Freq: Every day | ORAL | 0 refills | Status: AC

## 2018-02-24 MED ORDER — DYMISTA 137-50 MCG/ACT NA SUSP: NASAL | 5 refills | Status: DC

## 2018-02-24 MED ORDER — ALBUTEROL SULFATE HFA 108 (90 BASE) MCG/ACT IN AERS: INHALATION_SPRAY | RESPIRATORY_TRACT | 1 refills | Status: DC

## 2018-02-24 MED ORDER — RANITIDINE HCL 150 MG PO CAPS: 150.0000 mg | ORAL_CAPSULE | Freq: Two times a day (BID) | ORAL | 2 refills | Status: DC

## 2018-02-24 MED ORDER — ALBUTEROL SULFATE (2.5 MG/3ML) 0.083% IN NEBU: 2.5000 mg | INHALATION_SOLUTION | RESPIRATORY_TRACT | 12 refills | Status: DC | PRN

## 2018-02-24 MED ORDER — GUAIFENESIN-CODEINE 100-10 MG/5ML PO SOLN: 5.0000 mL | Freq: Three times a day (TID) | ORAL | 0 refills | Status: DC | PRN

## 2018-02-24 MED ORDER — PREDNISONE 20 MG PO TABS: ORAL_TABLET | ORAL | 0 refills | Status: DC

## 2018-02-24 NOTE — Progress Notes (Signed)
Subjective:    Patient ID: Mark Harrell, male    DOB: 1960/06/12, 57 y.o.   MRN: [id number]  HPI    This nice 57 yo Pernicious & Morbidly Obese single WM with BMI 55+ has multiple compromising physical co-morbidities & recently had Metolazone added to his Lasix regimen for worsening dependent edema. He apparently initially lost weight w/diuresis, but developed postaural dizziness & stopped all of his diuretics 3-4 days ago. He also has an exacerbation of Asthmatic Bronchitis. Denies fever, chills, sweats, rashes or dyspnea.   He is on polypharmacy:  Medication Sig  . albuterol HFA inhaler 1 to 2 inhalations 10 to 15 minutes apart every 4 hrs to rescue Asthma  . albuterol  2.5 MG neb  soln Inhale 3 mLs every 4  hours as needed.  [name] alfuzosin (UROXATRAL) 10 MG  Take 10 mg by mouth daily.    [name] anastrozole (ARIMIDEX) 1 MG tablet daily.  . baclofen (LIORESAL) 10 MG tablet Take 10 mg 3 (three) times daily as needed for muscle spasms.   . bisoprolol-hctz 5-6.25 MG tablet TAKE 1 TABLET BY MOUTH EVERY DAY  . Botulinum Toxin Type A 200 units SOLR Inject into the skin.  [name] CALCIUM + D Take 500 mg by mouth 3 (three) times daily.   . cetaphil (CETAPHIL) lotion Apply topically 2 (two) times daily.  . cetirizine (ZYRTEC) 10 MG tablet Take 10 mg by mouth daily.  . TUSSIONEX 10-8 MG/5ML SUER Take 5 mLs every 12 (twelve) hours by mouth.  [name] VITAMIN D Take 20000 in the morning and 10000 in the evening  . Cinnamon 500 MG capsule Take by mouth.  [name] LOTRISONE cream APPLY TO AFFECTED AREA TWICE A DAY  . co-enzyme Q-10 30 MG capsule Take 30 mg by mouth daily.  . cyclobenzaprine (FLEXERIL) 10 MG tablet Take 10 mg by mouth 3 (three) times daily.   [name] desmopressin (DDAVP) 0.2 MG tablet Take 200 mcg by mouth daily.   . diclofenac (VOLTAREN) 75 MG EC tablet Take 1 tablet (75 mg total) by mouth 2 (two) times daily.  . diclofenac sodium (VOLTAREN) 1 % GEL APPLY 4 GRAMS TOPICALLY 4 TIMES DAILY  . donepezil (ARICEPT) 23 MG  TABS tablet Take 23 mg by mouth at bedtime.  [name] doxycycline 100 MG capsule Take 1 capsule (100 mg total) by mouth 2 (two) times daily.  . Dupilumab, Asthma, (DUPIXENT) 200 MG/1.14ML SOSY Inject into the skin. Injects bi-weekly  . DYMISTA 137-50 MCG/ACT SUSP 2 SPRAYS INTO NOSTRILS 2  TIMES DAILY.  [name] Eluxadoline (VIBERZI) 100 MG TABS Take 1 tablet  2 times daily.   [name] EPINEPHrine 0.3 mg/0.3 mL  injec Inject as directed.  . TRELEGY ELLIPTA 100-62.5-25  Inhale 1 puff into the lungs daily.  . furosemide 80 MG tablet Take 1 to 1 & 1/2 tablets 2 x /day   . gabapentin  800 MG tablet Take 1 tablet (800 mg total) by mouth 3 (three) times daily.  [name] SWFUXNAT)557 MG/ML SOAJ Inject into the skin. Pt receives this monthly ingection  . GLUCOSAMINE-CHONDROITIN Take 3 tablets by mouth daily.  [name] MUCINEX 600 MG 12 hr tablet Take 3 tablets in the morning  . guaiFEN-codeine 100-10 MG/5ML syrup Take 5 mLs 3 x / daily as needed for cough.  [name] HYDROcodone-homatropine (HYCODAN) 5-1.5 MG/5ML syrup Take 5 mLs by mouth every 6 (six) hours as needed for cough. Do not take with other opioids such as tramadol.  [name] KRILL OIL PO Take  by mouth.  . LUTEIN PO Take 1 tablet by mouth 2 (two) times daily.  [name] NAMENDA 10 MG tablet Take 10 mg by mouth 2 (two) times daily.   . CEREFOLIN  Take 1 tablet by mouth daily.   . metolazone  5 MG tablet Take 1 tablet (5 mg total) by mouth daily.  . montelukast  10 MG tablet TAKE 1 TABLET BY MOUTH EVERY DAY  . Multiple Vitamins-Minerals  Take 1 tablet by mouth daily.  [name] NARCAN nasal spray 4 mg/0.1 mL USE AS DIRECTED  . ondansetron-ODT 4 MG  TAKE 1 TABLET  EVERY 6 HRS AS NEEDED  . PERCOCET 10-325 MG tablet Take 1 tablet by mouth every 4 (four) hours as needed for pain.  . AFRIN 0.05 % nasal spray Place 1 spray into both nostrils  EACH NIGHT  . ELMIRON 100 MG capsule Take 100 mg by mouth 3 (three) times daily before meals.   [name] VAYACOG Take 1 capsule by mouth 2 (two) times daily.  . pravastatin  40  MG tablet TAKE 1 TABLET BY MOUTH EVERYDAY AT BEDTIME  .  weekly allergy shots   . PROBIOTIC PRODUCT PO Take by mouth.  . ranitidine  150 MG capsule Take 1 capsule (150 mg total) by mouth 2 (two) times daily.  . sodium chloride 0.65 % SOLN nasal spray Uses  before other nasal sprays  . tadalafil 5 MG tablet Take 5 mg by mouth every evening.   . traMADol  50 MG tablet TAKE 1 TABLET EVERY 6 HRS AS NEEDED FOR COUGH  . triamcinolone cream (KENALOG) 0.1 % APPLY TOPICALLY 2 TIMES A DAY AS NEEDED  . SANCTURA 20 MG tablet Take 20 mg by mouth 2 (two) times daily.   . TURMERIC  Take 3 capsules by mouth daily.  . Potassium 99 MG TABS Take 1 tablet by mouth 3 (three) times daily.   . predniSONE  20 MG tablet 1 tab 3 x day - 3 days, then 2 x day - 3 days, then 1 x day - 5 days   Allergies  Allergen Reactions  . Bee Venom Swelling  . Ppd [Tuberculin Purified Protein Derivative] Other (See Comments)    +ppd NEG Quantferron Gold 3/13  . Fenofibrate Other (See Comments)    Back pain  . Levofloxacin Diarrhea  . Other Other (See Comments)    Some antibiotics cause diarrhea  . Verapamil Other (See Comments)    Back pain  . Claritin [Loratadine] Other (See Comments)    unknown  . Duloxetine     Brought on asthma   Past Medical History:  Diagnosis Date  . Anesthesia complication requiring reversal agent administration    ? from central apnea, very difficult to get off vent  . Anxiety   . Arthritis    osteo  . Asthma   . BPH (benign prostatic hyperplasia)   . Complication of anesthesia    difficulty waking , they twlight me because of my respiratory problems "  . Dyspnea   . Enlarged heart   . Family history of adverse reaction to anesthesia    mother trouble waking up, and heart stopped  . GERD (gastroesophageal reflux disease)   . Headache    botox injections for headaches  . Hyperlipidemia   . Hypertension   . Hypogonadism male   . IBS (irritable bowel syndrome)   . Obesity   . OSA  (obstructive sleep apnea)    cpap  . Pneumonia   .  Pre-diabetes   . Prostatitis   ' Past Surgical History:  Procedure Laterality Date  . ABDOMINAL SURGERY    . ANKLE FRACTURE SURGERY Right   . CYSTOSCOPY     [place]  . KNEE ARTHROSCOPY WITH MEDIAL MENISECTOMY Left 01/02/2017   Procedure: LEFT KNEE ARTHROSCOPY WITH PARTIAL MEDIAL MENISCECTOMY;  Surgeon: Mcarthur Rossetti, MD;  Location: [facility];  Service: Orthopedics;  Laterality: Left;  . TONSILLECTOMY    . [place] RESECTION  2007  . UVULOPALATOPHARYNGOPLASTY     Review of Systems   10 point systems review negative except as above.    Objective:   Physical Exam  BP 138/80-Rt / 116/74-Lt  P 88   T 97.3 F   R 18   Ht 6'   Wt  413 lb 12.8 oz  BMI 56.12  O2 sat 95-96%  Postural BP   117/85  P 92      &     Standing BP 125/81   P 96  Very congested wheezy cough.   HEENT - WNL. Neck - supple.  Chest - decreased with coarse ins rales & and exp rhonchi & coarse wheezes. Cor - Nl HS. RRR w/o sig MGR. PP 1(+).  1-2 + pretibial edema.. Abd - rotund pendulous abdominal panniculus MS- FROM w/o deformities.  Gait Nl. Neuro -  Nl w/o focal abnormalities.    Assessment & Plan:   1. Essential hypertension  - Magnesium - BASIC METABOLIC PANEL WITH GFR  2. Edema of both lower extremities due to peripheral venous insufficiency  3. Moderate persistent asthmatic bronchitis with acute exacerbation  - LEVAQUIN 500 MG tablet; Take 1 tablet  daily for 10 days.  Disp 10 tablet  - predniSONE 20 MG tablet; 1 tab 3 x day- 5 days, then 2 x day - 5 days, then 1 x day for 5 days  Disp30 tablet;   4. Medication management  - Magnesium - BASIC METABOLIC PANEL WITH GFR  ROV  1 Week & advised ER precautions

## 2018-02-24 NOTE — Progress Notes (Signed)
FOLLOW UP  Date of Service/Encounter:  02/24/18   Assessment:   Non-seasonal allergic rhinitis(sweet vernal grass, box elder, cat, weeds, ragweed, molds, cockroach, dust mite)  Viral URI - leading to worse control of asthma-COPD  Recurrent infections- restarting prophylactic doxycycline today  Asthma-COPD overlap syndrome-onDupixent  Hymenoptera allergy  GERD - on Nexiumand ranitidine  Obesity - with resulting nerve impingement and joint pain  Migraines - on botulinum and Emgality injections  Polypharmacy    Plan/Recommendations:   1. 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 - If this does not work, we will apply again for the immunoglobulin replacement.   2. Asthma-COPD overlap syndrome - with acute exacerbation likely triggered by a viral infection - Lung testing looked worse today, but it did improve with the albuterol nebulizer treatment. - Start the prednisone pack provided: Take 3 tabs (30mg ) twice daily for 3 days, then 2 tabs (20mg ) twice daily for 3 days, then 1 tab (10mg ) twice daily for 3 days, then STOP. - Call us on Friday with an update. - Make an appointment with Dr. Rolla Etienne so that he can provide some details. - Daily controller medication(s): Dupixent 300mg  every two weeks and Trelegy 100/62.5/25 one puff once daily - Prior to physical activity: ProAir 2 puffs 10-15 minutes before physical activity. - Rescue medications: ProAir 4 puffs every 4-6 hours as needed or albuterol nebulizer one vial every 4-6 hours as needed - Asthma control goals:  * Full participation in all desired activities (may need albuterol before activity) * Albuterol use two time or less a week on average (not counting use with activity) * Cough interfering with sleep two time or less a month * Oral steroids no more than once a year * No  hospitalizations  3. Chronic allergic rhinitis (sweet vernal grass, box elder, cat, weeds, ragweed, molds, cockroach, dust mite) - Continue with Dymista 2 sprays per nostril 1-2 times daily. - Use Afrin 1-2 sprays prior to the Dymista at night.  - Continue with saline mist 1-2 times daily.  - Use cetirizine 10mg  daily as needed for breakthrough symptoms. - Resume allergy shots once you have gotten more stabilized from a respiratory perspective.     4. Sleep apnea - Continue with oxygen at night.  - Continue with follow up with Dr. Lucia Gaskins [phone]).  5. GERD - Continue with ranitidine 150mg  daily.   6. Return in about 3 months (around 05/27/2018).  Subjective:   Mark Harrell is a 57 y.o. male presenting today for follow up of  Chief Complaint  Patient presents with  . Asthma  . Cough  . Wheezing    Mark Harrell has a history of the following: Patient Active Problem List   Diagnosis Date Noted  . Bilateral lower extremity edema 12/29/2017  . Hymenoptera allergy 11/10/2017  . Seasonal and perennial allergic rhinitis 04/23/2017  . 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 Cervicalgia 04/01/2017  . Headache 11/13/2016  . Migraine without aura and without status migrainosus, not intractable 10/30/2016  . Irritant contact dermatitis due to frequent handwashing 06/08/2016  . Lumbar radiculopathy 05/15/2016  . Osteoarthritis of spine with radiculopathy, lumbar region 05/15/2016  . Risk for falls 05/15/2016  . Acute medial meniscal tear, left, subsequent encounter 04/23/2016  . Recurrent infections 03/29/2016  . Chronic nonseasonal allergic rhinitis due to pollen 03/29/2016  . Polypharmacy 01/16/2016  . Morbid obesity  with BMI of 50.0-59.9, adult ([place]) 03/06/2015  . SDAT 02/05/2015  . Asthma 02/05/2015  . OSA and COPD overlap syndrome ([place]) 02/05/2015  . Medication management 08/02/2014  . GERD (gastroesophageal reflux disease) 05/09/2014  . Vitamin D deficiency 08/01/2013  .  Prediabetes 08/01/2013  . 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 Diverticula of colon 05/07/2011  . Hypertension 01/31/2011  . Hyperlipidemia, mixed 01/31/2011  . BPH (benign prostatic hyperplasia) 01/31/2011  . Testosterone Deficiency 01/31/2011  . IBS (irritable bowel syndrome) 01/31/2011  . Partial complex seizure disorder with intractable epilepsy ([place]) 01/31/2011  . Depression, major, recurrent, in partial remission ([place]) 01/31/2011  . Asthma-COPD overlap syndrome ([place]) 01/31/2011    History obtained from: chart review and patient.  Mark Harrell's Primary Care Provider is [name], MD.     Mark Harrell is a 57 y.o. male presenting for a sick visit.  He has a very complicated past medical history.  I last saw him in September 2018.  He does have a history of recurrent infections, but his immune work-up has been notable only for a slight B-cell memory defect.  He has been on prophylactic doxycycline, but he has not been compliant with this.  This was done as a bridge to start immunoglobulin replacement per his insurance requirements.  At his last visit, his lung testing looked stable.  We continued him on Dupixent 300 mg every 2 weeks and Trelegy 1 puff once daily.  He has a history of chronic allergic rhinitis.  We continue Dymista 2 sprays per nostril 1-2 times daily with Afrin used at night to help with CPAP tolerance.  We continued him on cetirizine 10 mg daily.  We also continued ranitidine 150 mg daily.  Since the last visit, he has been having problems with nasal congestion and sinus pressure. Monday he started having severe dehydration and felt terrible. Around 10 days ago, he started having problems with SOB. Last week it picked up noticeably and he continues to have ramping up of the symptoms.  He denies fevers.  He does report that he has had increasing hoarseness and thick mucus production both from his nose as well as expectorated during coughing.  He does not remember any sick contacts.   He typically stays in his house, where he lives with his mother.  He remains on all of his inhaler medications including Dupixent and Trelegy.  He feels that the [place] has been doing better job than the [place].  He remains on the doxycycline twice daily.  This was just restarted at the last visit, as he did not realize that he was supposed to continue on this.  He is not having any symptoms of is related to doxycycline.  He has had no breakthrough infections since I saw him a few weeks ago, with a current illness excluded.  He continues to have problems with his nose.  I recommended at the last visit that he talk to Dr. Lucia Gaskins again.  He remains on Dymista 1 to 2 sprays per nostril up to twice daily.  We did give him a prescription for Karbinal at the last visit since the sample seemed to work well.  He is also concerned with edema in his lower extremities.  He felt that this was related to Lyrica or gabapentin, so he has been changing these back and forth.  He thinks that the addition of gabapentin helped tremendously.  He also notes that his blood pressure medications can cause swelling.  He  is going to see his primary care provider this afternoon to try to clarify this little more.  Otherwise, there have been no changes to his past medical history, surgical history, family history, or social history.    Review of Systems: a 14-point review of systems is pertinent for what is mentioned in HPI.  Otherwise, all other systems were negative. Constitutional: negative other than that listed in the HPI Eyes: negative other than that listed in the HPI Ears, nose, mouth, throat, and face: negative other than that listed in the HPI Respiratory: negative other than that listed in the HPI Cardiovascular: negative other than that listed in the HPI Gastrointestinal: negative other than that listed in the HPI Genitourinary: negative other than that listed in the HPI Integument: negative other  than that listed in the HPI Hematologic: negative other than that listed in the HPI Musculoskeletal: negative other than that listed in the HPI Neurological: negative other than that listed in the HPI Allergy/Immunologic: negative other than that listed in the HPI    Objective:   Blood pressure 104/64, pulse 90, temperature 98.6 F (37 C), temperature source Oral, resp. rate 12, SpO2 93 %. There is no height or weight on file to calculate BMI.   Physical Exam:  General: Alert, interactive, in no acute distress.  Obese male.  Very hoarse. Eyes: No conjunctival injection bilaterally, no discharge on the right, no discharge on the left, no Horner-Trantas dots present and allergic shiners present bilaterally. PERRL bilaterally. EOMI without pain. No photophobia.  Ears: Right TM pearly gray with normal light reflex, Left TM pearly gray with normal light reflex, Right TM intact without perforation and Left TM intact without perforation.  Nose/Throat: External nose within normal limits and septum midline. Turbinates edematous with clear discharge. Posterior oropharynx moderately erythematous without cobblestoning in the posterior oropharynx. Tonsils 2+ without exudates.  Tongue without thrush. Lungs: Clear to auscultation without wheezing, rhonchi or rales. No increased work of breathing. CV: Normal S1/S2. No murmurs. Capillary refill <2 seconds.  Skin: Dry, erythematous, excoriated patches on the bilateral calves with a violacenous hue and some skin breakdown. Neuro:   Grossly intact. No focal deficits appreciated. Responsive to questions.  Diagnostic studies:   Spirometry: results abnormal (FEV1: 2.34/59%, FVC: 3.35/64%, FEV1/FVC: 70%).    Spirometry consistent with possible restrictive disease. Xopenex/Atrovent nebulizer treatment given in clinic with no improvement.  Allergy Studies: none     [name], MD  [facility]

## 2018-02-24 NOTE — Patient Instructions (Addendum)
1. 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 - If this does not work, we will apply again for the immunoglobulin replacement.   2. Asthma-COPD overlap syndrome - Lung testing looked worse today, but it did improve with the albuterol nebulizer treatment. - Start the prednisone pack provided: Take 3 tabs (30mg ) twice daily for 3 days, then 2 tabs (20mg ) twice daily for 3 days, then 1 tab (10mg ) twice daily for 3 days, then STOP. - Call us on Friday with an update!  - Make an appointment with Dr. Rolla Etienne so that he can provide some details. - Daily controller medication(s): Dupixent 300mg  every two weeks and Trelegy 100/62.5/25 one puff once daily - Prior to physical activity: ProAir 2 puffs 10-15 minutes before physical activity. - Rescue medications: ProAir 4 puffs every 4-6 hours as needed or albuterol nebulizer one vial every 4-6 hours as needed - Asthma control goals:  * Full participation in all desired activities (may need albuterol before activity) * Albuterol use two time or less a week on average (not counting use with activity) * Cough interfering with sleep two time or less a month * Oral steroids no more than once a year * No hospitalizations  3. Chronic allergic rhinitis (sweet vernal grass, box elder, cat, weeds, ragweed, molds, cockroach, dust mite) - Continue with Dymista 2 sprays per nostril 1-2 times daily. - Use Afrin 1-2 sprays prior to the Dymista at night.  - Continue with saline mist 1-2 times daily.  - Use cetirizine 10mg  daily as needed for breakthrough symptoms. - Resume allergy shots once you have gotten more stabilized from a respiratory perspective.     4. Sleep apnea - Continue with oxygen at night.  - Continue with follow up with Dr. Lucia Gaskins [phone]).  5. GERD - Continue with ranitidine 150mg  daily.   6. Return in about 3 months (around  05/27/2018).    Please inform us of any Emergency Department visits, hospitalizations, or changes in symptoms. Call us before going to the ED for breathing or allergy symptoms since we might be able to fit you in for a sick visit. Feel free to contact us anytime with any questions, problems, or concerns.  It was a pleasure to see you again today!  Websites that have reliable patient information: 1. American Academy of Asthma, Allergy, and Immunology: www.aaaai.org 2. Food Allergy Research and Education (FARE): foodallergy.org 3. Mothers of Asthmatics: http://www.asthmacommunitynetwork.org 4. American College of Allergy, Asthma, and Immunology: MonthlyElectricBill.co.uk   Make sure you are registered to vote! If you have moved or changed any of your contact information, you will need to get this updated before voting!

## 2018-02-25 ENCOUNTER — Encounter: Payer: Self-pay | Admitting: Allergy & Immunology

## 2018-02-25 ENCOUNTER — Other Ambulatory Visit: Payer: Self-pay | Admitting: Internal Medicine

## 2018-02-25 DIAGNOSIS — E876 Hypokalemia: Secondary | ICD-10-CM

## 2018-02-25 LAB — BASIC METABOLIC PANEL WITH GFR
BUN/Creatinine Ratio: 29 (calc) — ABNORMAL HIGH (ref 6–22)
BUN: 32 mg/dL — ABNORMAL HIGH (ref 7–25)
CO2: 40 mmol/L — AB (ref 20–32)
Calcium: 10.1 mg/dL (ref 8.6–10.3)
Chloride: 87 mmol/L — ABNORMAL LOW (ref 98–110)
Creat: 1.1 mg/dL (ref 0.70–1.33)
GFR, Est African American: 86 mL/min/{1.73_m2} (ref >=60)
GFR, Est Non African American: 74 mL/min/{1.73_m2} (ref >=60)
Glucose, Bld: 255 mg/dL — ABNORMAL HIGH (ref 65–99)
Potassium: 3 mmol/L — ABNORMAL LOW (ref 3.5–5.3)
SODIUM: 139 mmol/L (ref 135–146)

## 2018-02-25 LAB — MAGNESIUM: Magnesium: 1.8 mg/dL (ref 1.5–2.5)

## 2018-02-25 MED ORDER — METFORMIN HCL ER 500 MG PO TB24: ORAL_TABLET | ORAL | 3 refills | Status: DC

## 2018-02-25 MED ORDER — POTASSIUM CHLORIDE CRYS ER 20 MEQ PO TBCR: EXTENDED_RELEASE_TABLET | ORAL | 1 refills | Status: DC

## 2018-02-28 ENCOUNTER — Encounter: Payer: Self-pay | Admitting: Internal Medicine

## 2018-03-02 ENCOUNTER — Ambulatory Visit: Payer: BLUE CROSS/BLUE SHIELD | Admitting: Podiatry

## 2018-03-03 ENCOUNTER — Ambulatory Visit: Payer: BLUE CROSS/BLUE SHIELD | Admitting: Internal Medicine

## 2018-03-03 VITALS — BP 156/86 | HR 88 | Temp 97.0°F | Resp 18 | Ht 72.0 in | Wt >= 6400 oz

## 2018-03-03 DIAGNOSIS — J4541 Moderate persistent asthma with (acute) exacerbation: Secondary | ICD-10-CM | POA: Diagnosis not present

## 2018-03-03 DIAGNOSIS — I872 Venous insufficiency (chronic) (peripheral): Secondary | ICD-10-CM | POA: Diagnosis not present

## 2018-03-03 DIAGNOSIS — I10 Essential (primary) hypertension: Secondary | ICD-10-CM | POA: Diagnosis not present

## 2018-03-04 ENCOUNTER — Ambulatory Visit: Payer: BLUE CROSS/BLUE SHIELD | Admitting: Podiatry

## 2018-03-04 DIAGNOSIS — M722 Plantar fascial fibromatosis: Secondary | ICD-10-CM

## 2018-03-04 DIAGNOSIS — B351 Tinea unguium: Secondary | ICD-10-CM | POA: Diagnosis not present

## 2018-03-04 DIAGNOSIS — M79676 Pain in unspecified toe(s): Secondary | ICD-10-CM | POA: Diagnosis not present

## 2018-03-04 NOTE — Progress Notes (Signed)
Presents today chief complaint of painful elongated toenails.  Objective: Toenails are long thick yellow dystrophic-like mycotic.  Assessment: Pain in limb secondary onychomycosis.  Plan: Debridement of toenails 1 through 5 bilateral.

## 2018-03-05 ENCOUNTER — Other Ambulatory Visit: Payer: Self-pay | Admitting: Internal Medicine

## 2018-03-05 MED ORDER — PROMETHAZINE-DM 6.25-15 MG/5ML PO SYRP: ORAL_SOLUTION | ORAL | 1 refills | Status: DC

## 2018-03-05 MED ORDER — BENZONATATE 200 MG PO CAPS: ORAL_CAPSULE | ORAL | 1 refills | Status: DC

## 2018-03-07 ENCOUNTER — Encounter: Payer: Self-pay | Admitting: Internal Medicine

## 2018-03-07 NOTE — Progress Notes (Signed)
Subjective:    Patient ID: Mark Harrell, male    DOB: Mar 10, 1961, 57 y.o.   MRN: [id number]  HPI    This very nice 57 yo single WM  With multiple co-morbidities returns for f/u of AECB/Asthma and management of moderately severe venous insufficiency edema. Patient had restarted mlasix leaving off his metolazone for postural orthostatics and began re accumulating edema and since last OV has restarted daily Lasix with weight down 8# over the last week. He report his chest congestion is also better despite still having intermittent wheezing.   Medication Sig  . albuterol (PROVENTIL HFA;VENTOLIN HFA) 108 (90 Base) MCG/ACT inhaler 1 to 2 inhalations 10 to 15 minutes apart every 4 hrs to rescue Asthma  . albuterol (PROVENTIL) (2.5 MG/3ML) 0.083% nebulizer solution Inhale 3 mLs (2.5 mg total) into the lungs every 4 (four) hours as needed.  [name] alfuzosin (UROXATRAL) 10 MG 24 hr tablet Take 10 mg by mouth daily.    [name] anastrozole (ARIMIDEX) 1 MG tablet daily.  . baclofen (LIORESAL) 10 MG tablet Take 10 mg by mouth 3 (three) times daily as needed for muscle spasms.   . bisoprolol-hydrochlorothiazide (ZIAC) 5-6.25 MG tablet TAKE 1 TABLET BY MOUTH EVERY DAY  . Botulinum Toxin Type A 200 units SOLR Inject into the skin.  . Calcium Carbonate-Vitamin D (CALCIUM + D PO) Take 500 mg by mouth 3 (three) times daily.   . cetaphil (CETAPHIL) lotion Apply topically 2 (two) times daily.  . cetirizine (ZYRTEC) 10 MG tablet Take 10 mg by mouth daily.  . chlorpheniramine-HYDROcodone (TUSSIONEX) 10-8 MG/5ML SUER Take 5 mLs every 12 (twelve) hours by mouth.  . Cholecalciferol (VITAMIN D PO) Take 10,000-20,000 Units by mouth 2 (two) times daily. 20000 in the morning and 10000 in the evening  . Cinnamon 500 MG capsule Take by mouth.  . clotrimazole-betamethasone (LOTRISONE) cream APPLY TO AFFECTED AREA TWICE A DAY  . co-enzyme Q-10 30 MG capsule Take 30 mg by mouth daily.  . cyclobenzaprine (FLEXERIL) 10 MG tablet Take 10 mg by  mouth 3 (three) times daily.   [name] desmopressin (DDAVP) 0.2 MG tablet Take 200 mcg by mouth daily.   . diclofenac (VOLTAREN) 75 MG EC tablet Take 1 tablet (75 mg total) by mouth 2 (two) times daily.  . diclofenac sodium (VOLTAREN) 1 % GEL APPLY 4 GRAMS TOPICALLY 4 TIMES DAILY  . donepezil (ARICEPT) 23 MG TABS tablet Take 23 mg by mouth at bedtime.  [name] doxycycline (VIBRAMYCIN) 100 MG capsule Take 1 capsule (100 mg total) by mouth 2 (two) times daily.  . Dupilumab, Asthma, (DUPIXENT) 200 MG/1.14ML SOSY Inject into the skin. Injects bi-weekly  . DYMISTA 137-50 MCG/ACT SUSP PLACE 2 SPRAYS INTO BOTH NOSTRILS 2 (TWO) TIMES DAILY.  [name] Eluxadoline (VIBERZI) 100 MG TABS Take 1 tablet by mouth 2 (two) times daily.   [name] EPINEPHrine 0.3 mg/0.3 mL IJ SOAJ injection Inject as directed.  . Fluticasone-Umeclidin-Vilant (TRELEGY ELLIPTA) 100-62.5-25 MCG/INH AEPB Inhale 1 puff into the lungs daily.  . furosemide (LASIX) 80 MG tablet Take 1 to 1 & 1/2 tablets 2 x /day for Fluid Retention & Swelling of Legs  . gabapentin (NEURONTIN) 800 MG tablet Take 1 tablet (800 mg total) by mouth 3 (three) times daily.  [name] Galcanezumab-gnlm (EMGALITY) 120 MG/ML SOAJ Inject into the skin. Pt receives this monthly ingection  . GLUCOSAMINE-CHONDROITIN PO Take 3 tablets by mouth daily.  [name] guaiFENesin (MUCINEX) 600 MG 12 hr tablet Take 600 mg by mouth 2 (two)  times daily as needed for cough or to loosen phlegm. Instructed to take 3 tablets in the morning  . guaiFENesin-codeine 100-10 MG/5ML syrup Take 5 mLs by mouth 3 (three) times daily as needed for cough.  [name] HYDROcodone-homatropine (HYCODAN) 5-1.5 MG/5ML syrup Take 5 mLs by mouth every 6 (six) hours as needed for cough. Do not take with other opioids such as tramadol.  [name] KRILL OIL PO Take by mouth.  . levofloxacin (LEVAQUIN) 500 MG tablet Take 1 tablet (500 mg total) by mouth daily for 10 days.  . LUTEIN PO Take 1 tablet by mouth 2 (two) times daily.  . memantine (NAMENDA) 10 MG tablet  Take 10 mg by mouth 2 (two) times daily.   . metFORMIN (GLUCOPHAGE-XR) 500 MG 24 hr tablet Take 2 tablets 2 x /day with Breakfast & Supper  for Diabetes  . Methylfol-Algae-B12-Acetylcyst (CEREFOLIN NAC) 6-90.314-2-600 MG TABS Take 1 tablet by mouth daily.   . metolazone (ZAROXOLYN) 5 MG tablet Take 1 tablet (5 mg total) by mouth daily.  . montelukast (SINGULAIR) 10 MG tablet TAKE 1 TABLET BY MOUTH EVERY DAY  . Multiple Vitamins-Minerals (MULTIVITAMIN WITH MINERALS) tablet Take 1 tablet by mouth daily.  . naloxone (NARCAN) nasal spray 4 mg/0.1 mL USE AS DIRECTED  . ondansetron (ZOFRAN-ODT) 4 MG disintegrating tablet TAKE 1 TABLET (4 MG TOTAL) BY MOUTH EVERY 6 (SIX) HOURS AS NEEDED FOR NAUSEA OR VOMITING.  [name] oxyCODONE-acetaminophen (PERCOCET) 10-325 MG tablet Take 1 tablet by mouth every 4 (four) hours as needed for pain.  [name] oxymetazoline (AFRIN) 0.05 % nasal spray Place 1 spray into both nostrils See admin instructions. 1 SPRAY PER SIDE EACH NIGHT BEFORE DYMISTA  . pentosan polysulfate (ELMIRON) 100 MG capsule Take 100 mg by mouth 3 (three) times daily before meals.   . Phosphatidylserine-DHA-EPA (VAYACOG PO) Take 1 capsule by mouth 2 (two) times daily.  . potassium chloride SA (K-DUR,KLOR-CON) 20 MEQ tablet Take 1 tablet 2 x /day  For potassium  . pravastatin (PRAVACHOL) 40 MG tablet TAKE 1 TABLET BY MOUTH EVERYDAY AT BEDTIME  . PRESCRIPTION MEDICATION Pt receives weekly allergy shots  . PROBIOTIC PRODUCT PO Take by mouth.  . sodium chloride 0.65 % SOLN nasal spray Place 1 spray into both nostrils 4 (four) times daily as needed for congestion. Uses each time before other nasal sprays  . tadalafil (CIALIS) 5 MG tablet Take 5 mg by mouth every evening.   . traMADol (ULTRAM) 50 MG tablet TAKE 1 TABLET EVERY 6 HRS AS NEEDED   . triamcinolone cream  0.1 % APPLY TOPICALLY  2 TIMES A DAY AS NEEDED  . trospium (SANCTURA) 20 MG tablet Take 20 mg by mouth 2  times daily.   . TURMERIC  Take 3 capsules   daily.   Allergies  Allergen Reactions  . Bee Venom Swelling  . Ppd [Tuberculin Purified Protein Derivative] Other (See Comments)    +ppd NEG Quantferron Gold 3/13  . Fenofibrate Other (See Comments)    Back pain  . Levofloxacin Diarrhea  . Other Other (See Comments)    Some antibiotics cause diarrhea  . Verapamil Other (See Comments)    Back pain  . Claritin [Loratadine] Other (See Comments)    unknown  . Duloxetine     Brought on asthma   Past Medical History:  Diagnosis Date  . Anesthesia complication requiring reversal agent administration    ? from central apnea, very difficult to get off vent  . Anxiety   .  Arthritis    osteo  . Asthma   . BPH (benign prostatic hyperplasia)   . Complication of anesthesia    difficulty waking , they twlight me because of my respiratory problems "  . Dyspnea   . Enlarged heart   . Family history of adverse reaction to anesthesia    mother trouble waking up, and heart stopped  . GERD (gastroesophageal reflux disease)   . Headache    botox injections for headaches  . Hyperlipidemia   . Hypertension   . Hypogonadism male   . IBS (irritable bowel syndrome)   . Obesity   . OSA (obstructive sleep apnea)    cpap  . Pneumonia   . Pre-diabetes   . Prostatitis    Review of Systems    10 point systems review negative except as above.    Objective:   Physical Exam   Morbid obesity. Some wheezes clear w/cough. No Distress.   BP (!) 156/86   Pulse 88   Temp (!) 97 F (36.1 C)   Resp 18   Ht 6' (1.829 m)   Wt (!) 405 lb 12.8 oz (184.1 kg)   BMI 55.04 kg/m   O2 sat 96%   HEENT - WNL. Neck - supple.  Chest - Clear equal BS. Cor - Nl HS. RRR w/o sig MGR. PP 1(+). No edema. MS- FROM w/o deformities.  Gait Nl. Neuro -  Nl w/o focal abnormalities. Skin - No rash, cyanosis or icterus.     Assessment & Plan:   1. Essential hypertension  - Continue Lasix 80 mg  - Advised re-start Metolazone every 3-4 days   2. Edema of both  lower extremities due to peripheral venous insufficiency  3. Moderate persistent asthmatic bronchitis with acute exacerbation      .

## 2018-03-08 ENCOUNTER — Ambulatory Visit: Payer: Self-pay | Admitting: Adult Health

## 2018-03-09 DIAGNOSIS — R419 Unspecified symptoms and signs involving cognitive functions and awareness: Secondary | ICD-10-CM | POA: Diagnosis not present

## 2018-03-09 DIAGNOSIS — G43709 Chronic migraine without aura, not intractable, without status migrainosus: Secondary | ICD-10-CM | POA: Diagnosis not present

## 2018-03-09 DIAGNOSIS — G4733 Obstructive sleep apnea (adult) (pediatric): Secondary | ICD-10-CM | POA: Diagnosis not present

## 2018-03-12 ENCOUNTER — Other Ambulatory Visit: Payer: Self-pay | Admitting: Internal Medicine

## 2018-03-18 ENCOUNTER — Ambulatory Visit (HOSPITAL_COMMUNITY)
Admission: RE | Admit: 2018-03-18 | Discharge: 2018-03-18 | Disposition: A | Discharge status: Home / Self Care | Payer: BLUE CROSS/BLUE SHIELD | Source: Ambulatory Visit | Attending: Internal Medicine | Admitting: Internal Medicine

## 2018-03-18 ENCOUNTER — Ambulatory Visit (INDEPENDENT_AMBULATORY_CARE_PROVIDER_SITE_OTHER): Payer: BLUE CROSS/BLUE SHIELD | Admitting: Internal Medicine

## 2018-03-18 VITALS — BP 126/82 | HR 96 | Temp 98.8°F | Resp 20 | Ht 72.0 in | Wt >= 6400 oz

## 2018-03-18 DIAGNOSIS — R0902 Hypoxemia: Secondary | ICD-10-CM | POA: Insufficient documentation

## 2018-03-18 DIAGNOSIS — R531 Weakness: Secondary | ICD-10-CM | POA: Diagnosis not present

## 2018-03-18 DIAGNOSIS — R5383 Other fatigue: Secondary | ICD-10-CM | POA: Diagnosis not present

## 2018-03-18 DIAGNOSIS — Z79899 Other long term (current) drug therapy: Secondary | ICD-10-CM | POA: Diagnosis not present

## 2018-03-18 DIAGNOSIS — J40 Bronchitis, not specified as acute or chronic: Secondary | ICD-10-CM | POA: Diagnosis not present

## 2018-03-18 MED ORDER — SULFAMETHOXAZOLE-TRIMETHOPRIM 800-160 MG PO TABS: ORAL_TABLET | ORAL | 0 refills | Status: DC

## 2018-03-18 NOTE — Progress Notes (Signed)
Subjective:    Patient ID: Mark Harrell, male    DOB: 09-May-1961, 57 y.o.   MRN: [id number]  HPI   This 57 yo single WM returns with an exacerbation of bronchitis c/o severe weakness and bloody mucopurulent sputum. He also has chronic venous insufficiency & ha left off his Lasix x 5 days for obscure reasons. Denies fever, chills, rash. He is on continuous O2.   Medication Sig  . albuterol (PROVENTIL HFA;VENTOLIN HFA) 108 (90 Base) MCG/ACT inhaler 1 to 2 inhalations 10 to 15 minutes apart every 4 hrs to rescue Asthma  . albuterol (PROVENTIL) (2.5 MG/3ML) 0.083% nebulizer solution Inhale 3 mLs (2.5 mg total) into the lungs every 4 (four) hours as needed.  [name] alfuzosin (UROXATRAL) 10 MG 24 hr tablet Take 10 mg by mouth daily.    [name] anastrozole (ARIMIDEX) 1 MG tablet daily.  . baclofen (LIORESAL) 10 MG tablet Take 10 mg by mouth 3 (three) times daily as needed for muscle spasms.   . benzonatate (TESSALON) 200 MG capsule Take 1 perle 3 x / day to prevent cough  . bisoprolol-hydrochlorothiazide (ZIAC) 5-6.25 MG tablet TAKE 1 TABLET BY MOUTH EVERY DAY  . Botulinum Toxin Type A 200 units SOLR Inject into the skin.  . Calcium Carbonate-Vitamin D (CALCIUM + D PO) Take 500 mg by mouth 3 (three) times daily.   . cetaphil (CETAPHIL) lotion Apply topically 2 (two) times daily.  . cetirizine (ZYRTEC) 10 MG tablet Take 10 mg by mouth daily.  . chlorpheniramine-HYDROcodone (TUSSIONEX) 10-8 MG/5ML SUER Take 5 mLs every 12 (twelve) hours by mouth.  . Cholecalciferol (VITAMIN D PO) Take 10,000-20,000 Units by mouth 2 (two) times daily. 20000 in the morning and 10000 in the evening  . Cinnamon 500 MG capsule Take by mouth.  . clotrimazole-betamethasone (LOTRISONE) cream APPLY TO AFFECTED AREA TWICE A DAY  . co-enzyme Q-10 30 MG capsule Take 30 mg by mouth daily.  . cyclobenzaprine (FLEXERIL) 10 MG tablet Take 10 mg by mouth 3 (three) times daily.   [name] desmopressin (DDAVP) 0.2 MG tablet Take 200 mcg by mouth daily.    . diclofenac (VOLTAREN) 75 MG EC tablet Take 1 tablet (75 mg total) by mouth 2 (two) times daily.  . diclofenac sodium (VOLTAREN) 1 % GEL APPLY 4 GRAMS TOPICALLY 4 TIMES DAILY  . donepezil (ARICEPT) 23 MG TABS tablet Take 23 mg by mouth at bedtime.  [name] doxycycline (VIBRAMYCIN) 100 MG capsule Take 100 mg by mouth 2 (two) times daily.  . Dupilumab, Asthma, (DUPIXENT) 200 MG/1.14ML SOSY Inject into the skin. Injects bi-weekly  . DYMISTA 137-50 MCG/ACT SUSP PLACE 2 SPRAYS INTO BOTH NOSTRILS 2 (TWO) TIMES DAILY.  [name] Eluxadoline (VIBERZI) 100 MG TABS Take 1 tablet by mouth 2 (two) times daily.   [name] EPINEPHrine 0.3 mg/0.3 mL IJ SOAJ injection Inject as directed.  . Fluticasone-Umeclidin-Vilant (TRELEGY ELLIPTA) 100-62.5-25 MCG/INH AEPB Inhale 1 puff into the lungs daily.  . furosemide (LASIX) 80 MG tablet Take 1 to 1 & 1/2 tablets 2 x /day for Fluid Retention & Swelling of Legs  . gabapentin (NEURONTIN) 800 MG tablet Take 1 tablet (800 mg total) by mouth 3 (three) times daily.  [name] Galcanezumab-gnlm (EMGALITY) 120 MG/ML SOAJ Inject into the skin. Pt receives this monthly ingection  . GLUCOSAMINE-CHONDROITIN PO Take 3 tablets by mouth daily.  [name] guaiFENesin (MUCINEX) 600 MG 12 hr tablet Take 600 mg by mouth 2 (two) times daily as needed for cough or to loosen phlegm.  Instructed to take 3 tablets in the morning  . guaiFENesin-codeine 100-10 MG/5ML syrup Take 5 mLs by mouth 3 (three) times daily as needed for cough.  [name] HYDROcodone-homatropine (HYCODAN) 5-1.5 MG/5ML syrup Take 5 mLs by mouth every 6 (six) hours as needed for cough. Do not take with other opioids such as tramadol.  [name] KRILL OIL PO Take by mouth.  . LUTEIN PO Take 1 tablet by mouth 2 (two) times daily.  . memantine (NAMENDA) 10 MG tablet Take 10 mg by mouth 2 (two) times daily.   . metFORMIN (GLUCOPHAGE-XR) 500 MG 24 hr tablet Take 2 tablets 2 x /day with Breakfast & Supper  for Diabetes  . Methylfol-Algae-B12-Acetylcyst (CEREFOLIN NAC)  6-90.314-2-600 MG TABS Take 1 tablet by mouth daily.   . montelukast (SINGULAIR) 10 MG tablet TAKE 1 TABLET BY MOUTH EVERY DAY  . Multiple Vitamins-Minerals (MULTIVITAMIN WITH MINERALS) tablet Take 1 tablet by mouth daily.  . naloxone (NARCAN) nasal spray 4 mg/0.1 mL USE AS DIRECTED  . ondansetron (ZOFRAN-ODT) 4 MG disintegrating tablet TAKE 1 TABLET (4 MG TOTAL) BY MOUTH EVERY 6 (SIX) HOURS AS NEEDED FOR NAUSEA OR VOMITING.  [name] oxyCODONE-acetaminophen (PERCOCET) 10-325 MG tablet Take 1 tablet by mouth every 4 (four) hours as needed for pain.  [name] oxymetazoline (AFRIN) 0.05 % nasal spray Place 1 spray into both nostrils See admin instructions. 1 SPRAY PER SIDE EACH NIGHT BEFORE DYMISTA  . pentosan polysulfate (ELMIRON) 100 MG capsule Take 100 mg by mouth 3 (three) times daily before meals.   . Phosphatidylserine-DHA-EPA (VAYACOG PO) Take 1 capsule by mouth 2 (two) times daily.  . potassium chloride SA (K-DUR,KLOR-CON) 20 MEQ tablet Take 1 tablet 2 x /day  For potassium  . pravastatin (PRAVACHOL) 40 MG tablet TAKE 1 TABLET BY MOUTH EVERYDAY AT BEDTIME  . PRESCRIPTION MEDICATION Pt receives weekly allergy shots  . PROBIOTIC PRODUCT PO Take by mouth.  . promethazine-dextromethorphan (PROMETHAZINE-DM) 6.25-15 MG/5ML syrup Take 1 to 2 tsp enery 4 hours if needed for cough  . ranitidine (ZANTAC) 150 MG capsule Take 1 capsule (150 mg total) by mouth 2 (two) times daily.  . sodium chloride (OCEAN) 0.65 % SOLN nasal spray Place 1 spray into both nostrils 4 (four) times daily as needed for congestion. Uses each time before other nasal sprays  . tadalafil (CIALIS) 5 MG tablet Take 5 mg by mouth every evening.   . traMADol (ULTRAM) 50 MG tablet TAKE 1 TABLET EVERY 6 HOURS AS NEEDED FOR COUGH  . triamcinolone cream (KENALOG) 0.1 % APPLY TOPICALLY 4 TIMES A DAY (Patient taking differently: APPLY TOPICALLY 2 TIMES A DAY AS NEEDED for itching)  . trospium (SANCTURA) 20 MG tablet Take 20 mg by mouth 2 (two) times  daily.   . TURMERIC PO Take 3 capsules by mouth daily.  . metolazone (ZAROXOLYN) 5 MG tablet TAKE 1 TABLET BY MOUTH EVERY DAY   Allergies  Allergen Reactions  . Bee Venom Swelling  . Ppd [Tuberculin Purified Protein Derivative] Other (See Comments)    +ppd NEG Quantferron Gold 3/13  . Fenofibrate Other (See Comments)    Back pain  . Levofloxacin Diarrhea  . Other Other (See Comments)    Some antibiotics cause diarrhea  . Verapamil Other (See Comments)    Back pain  . Claritin [Loratadine] Other (See Comments)    unknown  . Duloxetine     Brought on asthma   Past Medical History:  Diagnosis Date  . Anesthesia complication requiring reversal agent  administration    ? from central apnea, very difficult to get off vent  . Anxiety   . Arthritis    osteo  . Asthma   . BPH (benign prostatic hyperplasia)   . Complication of anesthesia    difficulty waking , they twlight me because of my respiratory problems "  . Dyspnea   . Enlarged heart   . Family history of adverse reaction to anesthesia    mother trouble waking up, and heart stopped  . GERD (gastroesophageal reflux disease)   . Headache    botox injections for headaches  . Hyperlipidemia   . Hypertension   . Hypogonadism male   . IBS (irritable bowel syndrome)   . Obesity   . OSA (obstructive sleep apnea)    cpap  . Pneumonia   . Pre-diabetes   . Prostatitis    Past Surgical History:  Procedure Laterality Date  . ABDOMINAL SURGERY    . ANKLE FRACTURE SURGERY Right   . CYSTOSCOPY     [place]  . KNEE ARTHROSCOPY WITH MEDIAL MENISECTOMY Left 01/02/2017   Procedure: LEFT KNEE ARTHROSCOPY WITH PARTIAL MEDIAL MENISCECTOMY;  Surgeon: Mcarthur Rossetti, MD;  Location: [facility];  Service: Orthopedics;  Laterality: Left;  . TONSILLECTOMY    . [place] RESECTION  2007  . UVULOPALATOPHARYNGOPLASTY         10 point systems review negative except as above.    Objective:   Physical Exam  BP 126/82   Pulse 96    Temp 98.8 F (37.1 C) (Oral)   Resp 20   Ht 6' (1.829 m)   Wt (!) 401 lb (181.9 kg)   BMI 54.39 kg/m   O2 sat 96%   Postural Sitting BP 128/86    P  98     &   Standing BP 124/85   P 104  HEENT - WNL. Neck - supple.  Chest - Clear equal BS. Cor - Nl HS. RRR w/o sig MGR. PP 1(+). No edema. MS- FROM w/o deformities.  Gait Nl. Neuro -  Nl w/o focal abnormalities. Skin - No rash, cyanosis or icterus    Assessment & Plan:   1. Bronchitis  - CBC with Differential/Platelet - DG Chest 2 View; Future  2. Hypoxia  - DG Chest 2 View; Future  3. Fatigue, unspecified type  - CBC with Differential/Platelet - COMPLETE METABOLIC PANEL WITH GFR  - DG Chest 2 View; Future  4. Medication management  - CBC with Differential/Platelet - COMPLETE METABOLIC PANEL WITH GFR  ROV - 1 week to recheck

## 2018-03-19 ENCOUNTER — Other Ambulatory Visit: Payer: Self-pay | Admitting: Adult Health

## 2018-03-19 ENCOUNTER — Other Ambulatory Visit: Payer: Self-pay | Admitting: Internal Medicine

## 2018-03-19 DIAGNOSIS — G43709 Chronic migraine without aura, not intractable, without status migrainosus: Secondary | ICD-10-CM | POA: Diagnosis not present

## 2018-03-19 LAB — COMPLETE METABOLIC PANEL WITH GFR
AG RATIO: 1.6 (calc) (ref 1.0–2.5)
ALT: 31 U/L (ref 9–46)
AST: 16 U/L (ref 10–35)
Albumin: 3.6 g/dL (ref 3.6–5.1)
Alkaline phosphatase (APISO): 62 U/L (ref 40–115)
BUN: 16 mg/dL (ref 7–25)
CALCIUM: 8.8 mg/dL (ref 8.6–10.3)
CO2: 28 mmol/L (ref 20–32)
CREATININE: 0.97 mg/dL (ref 0.70–1.33)
Chloride: 103 mmol/L (ref 98–110)
GFR, EST AFRICAN AMERICAN: 100 mL/min/{1.73_m2} (ref >=60)
GFR, EST NON AFRICAN AMERICAN: 86 mL/min/{1.73_m2} (ref >=60)
Globulin: 2.2 g/dL (calc) (ref 1.9–3.7)
Glucose, Bld: 132 mg/dL — ABNORMAL HIGH (ref 65–99)
POTASSIUM: 4.5 mmol/L (ref 3.5–5.3)
Sodium: 141 mmol/L (ref 135–146)
TOTAL PROTEIN: 5.8 g/dL — AB (ref 6.1–8.1)
Total Bilirubin: 1 mg/dL (ref 0.2–1.2)

## 2018-03-19 LAB — CBC WITH DIFFERENTIAL/PLATELET
BASOS ABS: 99 {cells}/uL (ref 0–200)
BASOS PCT: 1.1 %
EOS ABS: 279 {cells}/uL (ref 15–500)
Eosinophils Relative: 3.1 %
HCT: 49 % (ref 38.5–50.0)
Hemoglobin: 16.3 g/dL (ref 13.2–17.1)
Lymphs Abs: 2403 cells/uL (ref 850–3900)
MCH: 29.9 pg (ref 27.0–33.0)
MCHC: 33.3 g/dL (ref 32.0–36.0)
MCV: 89.7 fL (ref 80.0–100.0)
MONOS PCT: 9.1 %
MPV: 11 fL (ref 7.5–12.5)
NEUTROS ABS: 5400 {cells}/uL (ref 1500–7800)
NEUTROS PCT: 60 %
PLATELETS: 189 10*3/uL (ref 140–400)
RBC: 5.46 10*6/uL (ref 4.20–5.80)
RDW: 13.6 % (ref 11.0–15.0)
TOTAL LYMPHOCYTE: 26.7 %
WBC: 9 10*3/uL (ref 3.8–10.8)
WBCMIX: 819 {cells}/uL (ref 200–950)

## 2018-03-19 MED ORDER — METOLAZONE 5 MG PO TABS: ORAL_TABLET | ORAL | 1 refills | Status: DC

## 2018-03-20 DIAGNOSIS — G4733 Obstructive sleep apnea (adult) (pediatric): Secondary | ICD-10-CM | POA: Diagnosis not present

## 2018-03-21 ENCOUNTER — Encounter: Payer: Self-pay | Admitting: Internal Medicine

## 2018-03-23 ENCOUNTER — Telehealth: Payer: Self-pay | Admitting: Pulmonary Disease

## 2018-03-23 ENCOUNTER — Telehealth: Payer: Self-pay | Admitting: Internal Medicine

## 2018-03-23 NOTE — Telephone Encounter (Signed)
LMTCB with patient.   Called and spoke to [place], informed Mark Harrell we would call and discuss with patient. [facility] currently is not providing POC and the patient would have to switch DME companies.

## 2018-03-23 NOTE — Telephone Encounter (Signed)
At office visit with Dr [name], Patient requesting pulse delivered Portable Oxygen Concentrator instead of current portable cyclinders. Current DME [facility], these are not available with [facility]. Advised that Pulmonary provider is managing his oxygen. We can call and request of behalf of patient. Spoke with Pulmonary nurse, she will speak with provider and call patient to discuss.

## 2018-03-24 ENCOUNTER — Other Ambulatory Visit: Payer: Self-pay | Admitting: Pulmonary Disease

## 2018-03-24 DIAGNOSIS — J449 Chronic obstructive pulmonary disease, unspecified: Secondary | ICD-10-CM

## 2018-03-24 NOTE — Telephone Encounter (Signed)
Called and spoke with patient regarding switching of DME for POC. Advised pt that he is need of face to face and qualifying walk in our office in order to receive POC with [facility] Scheduled appt with TP 03/29/18 at 3pm  Pt will need to be walked to qualify for O2, have face to face notes sent to [place] with [facility] On the new POC order, have to add a home concentrator to the order. Pt verbalized understanding  Called and spoke with Gilda regarding pt appt for 03/29/18 She verbalized understanding Nothing further needed

## 2018-03-24 NOTE — Telephone Encounter (Signed)
Waiting on signature to send to [place].

## 2018-03-24 NOTE — Telephone Encounter (Signed)
Sent CM to [place] w/ [facility].

## 2018-03-24 NOTE — Telephone Encounter (Signed)
[facility], could you please help this patient switch DME companies for a POC.  At this time patient is with [facility], and is in need of a POC. They no longer offer POC at this time.  Please advise, Thank you.

## 2018-03-24 NOTE — Telephone Encounter (Signed)
Spoke to [place] w/ [facility] who advises pt will need a walk test & a face to face to get POC w/ them.  Bethanne Ginger can be reached at [phone].

## 2018-03-24 NOTE — Telephone Encounter (Signed)
Called and spoke to pt. Pt states he would like to change DME's because of wanting a POC. Order has already been placed. Will forward to PCCs to address.   Pt states he does not have a preference for another DME and has only been with [facility] for less than a year.

## 2018-03-24 NOTE — Telephone Encounter (Signed)
Pt is returning call about POC. Cb is [phone].

## 2018-03-25 ENCOUNTER — Emergency Department (HOSPITAL_COMMUNITY)
Admission: EM | Admit: 2018-03-25 | Discharge: 2018-03-25 | Disposition: A | Discharge status: Home / Self Care | Payer: BLUE CROSS/BLUE SHIELD | Attending: Emergency Medicine | Admitting: Emergency Medicine

## 2018-03-25 ENCOUNTER — Other Ambulatory Visit: Payer: Self-pay

## 2018-03-25 ENCOUNTER — Emergency Department (HOSPITAL_COMMUNITY): Payer: BLUE CROSS/BLUE SHIELD

## 2018-03-25 ENCOUNTER — Encounter (HOSPITAL_COMMUNITY): Payer: Self-pay

## 2018-03-25 ENCOUNTER — Encounter: Payer: Self-pay | Admitting: Internal Medicine

## 2018-03-25 ENCOUNTER — Ambulatory Visit: Payer: BLUE CROSS/BLUE SHIELD | Admitting: Internal Medicine

## 2018-03-25 VITALS — BP 132/80 | HR 76 | Temp 97.3°F | Resp 16

## 2018-03-25 DIAGNOSIS — J449 Chronic obstructive pulmonary disease, unspecified: Secondary | ICD-10-CM

## 2018-03-25 DIAGNOSIS — I10 Essential (primary) hypertension: Secondary | ICD-10-CM | POA: Diagnosis not present

## 2018-03-25 DIAGNOSIS — F329 Major depressive disorder, single episode, unspecified: Secondary | ICD-10-CM | POA: Diagnosis not present

## 2018-03-25 DIAGNOSIS — M545 Low back pain, unspecified: Secondary | ICD-10-CM

## 2018-03-25 DIAGNOSIS — W19XXXA Unspecified fall, initial encounter: Secondary | ICD-10-CM | POA: Diagnosis not present

## 2018-03-25 DIAGNOSIS — Z79899 Other long term (current) drug therapy: Secondary | ICD-10-CM | POA: Diagnosis not present

## 2018-03-25 DIAGNOSIS — F419 Anxiety disorder, unspecified: Secondary | ICD-10-CM | POA: Diagnosis not present

## 2018-03-25 DIAGNOSIS — R69 Illness, unspecified: Secondary | ICD-10-CM

## 2018-03-25 DIAGNOSIS — R0902 Hypoxemia: Secondary | ICD-10-CM | POA: Diagnosis not present

## 2018-03-25 DIAGNOSIS — S3992XA Unspecified injury of lower back, initial encounter: Secondary | ICD-10-CM | POA: Diagnosis not present

## 2018-03-25 DIAGNOSIS — Z87891 Personal history of nicotine dependence: Secondary | ICD-10-CM | POA: Diagnosis not present

## 2018-03-25 DIAGNOSIS — W01198A Fall on same level from slipping, tripping and stumbling with subsequent striking against other object, initial encounter: Secondary | ICD-10-CM | POA: Insufficient documentation

## 2018-03-25 DIAGNOSIS — R52 Pain, unspecified: Secondary | ICD-10-CM | POA: Diagnosis not present

## 2018-03-25 DIAGNOSIS — Y92009 Unspecified place in unspecified non-institutional (private) residence as the place of occurrence of the external cause: Secondary | ICD-10-CM | POA: Diagnosis not present

## 2018-03-25 MED ORDER — MORPHINE SULFATE (PF) 4 MG/ML IV SOLN
4.0000 mg | Freq: Once | INTRAVENOUS | Status: AC
  Administered 2018-03-25: 4 mg via INTRAVENOUS
  Filled 2018-03-25: qty 1

## 2018-03-25 MED ORDER — OXYCODONE-ACETAMINOPHEN 5-325 MG PO TABS: 1.0000 | ORAL_TABLET | ORAL | 0 refills | Status: DC | PRN

## 2018-03-25 MED ORDER — LIDOCAINE 5 % EX PTCH
1.0000 | MEDICATED_PATCH | CUTANEOUS | Status: DC
  Administered 2018-03-25: 1 via TRANSDERMAL
  Filled 2018-03-25: qty 1

## 2018-03-25 MED ORDER — LIDOCAINE 5 % EX PTCH: 1.0000 | MEDICATED_PATCH | CUTANEOUS | 0 refills | Status: DC

## 2018-03-25 NOTE — ED Triage Notes (Addendum)
Per [facility], pt arrives from home after having an unwitnessed fall while walking and he lost his balance. Pt reports he fell back against a door. Pt reports pain to back and tailbone. Pt went to his MD appointment where he started to have decreased sensation/tingling to both lower extremities. EMS called from office. Pt states tingling has stopped and legs feel normal again and states it lasted approximately 20-30 minutes. Pt reports pain initially 10/10, received 100 mcg of fentanyl and now pain 3/10. Pt is alert and oriented. Pt denies hitting head/losing consciousness.

## 2018-03-25 NOTE — ED Notes (Signed)
Patient transported to MRI 

## 2018-03-25 NOTE — ED Notes (Signed)
Patient transported to CT 

## 2018-03-25 NOTE — ED Notes (Signed)
ED Provider at bedside. 

## 2018-03-25 NOTE — ED Notes (Signed)
Patient verbalizes understanding of discharge instructions. Opportunity for questioning and answers were provided. Armband removed by staff, pt discharged from ED. Pt wheeled to lobby. 

## 2018-03-25 NOTE — Progress Notes (Signed)
Attempted pt in MRI. We were able to get him in our largest scanner but unable to scan pt due to him not being able to adjust with the table.

## 2018-03-25 NOTE — ED Provider Notes (Signed)
[facility] EMERGENCY DEPARTMENT Provider Note   CSN: [id number] Arrival date & time: 03/25/18  1650     History   Chief Complaint Chief Complaint  Patient presents with  . Fall    HPI [name] is a 57 y.o. male.  The history is provided by the patient and medical records. No language interpreter was used.  Fall  This is a new problem. The current episode started 6 to 12 hours ago. The problem occurs rarely. The problem has not changed since onset.Pertinent negatives include no chest pain, no abdominal pain, no headaches and no shortness of breath. The symptoms are aggravated by standing and walking. Nothing relieves the symptoms. He has tried nothing for the symptoms. The treatment provided no relief.    Past Medical History:  Diagnosis Date  . Anesthesia complication requiring reversal agent administration    ? from central apnea, very difficult to get off vent  . Anxiety   . Arthritis    osteo  . Asthma   . BPH (benign prostatic hyperplasia)   . Complication of anesthesia    difficulty waking , they twlight me because of my respiratory problems "  . Dyspnea   . Enlarged heart   . Family history of adverse reaction to anesthesia    mother trouble waking up, and heart stopped  . GERD (gastroesophageal reflux disease)   . Headache    botox injections for headaches  . Hyperlipidemia   . Hypertension   . Hypogonadism male   . IBS (irritable bowel syndrome)   . Obesity   . OSA (obstructive sleep apnea)    cpap  . Pneumonia   . Pre-diabetes   . Prostatitis     Patient Active Problem List   Diagnosis Date Noted  . Bilateral lower extremity edema 12/29/2017  . Hymenoptera allergy 11/10/2017  . Seasonal and perennial allergic rhinitis 04/23/2017  . 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 Cervicalgia 04/01/2017  . Headache 11/13/2016  . Migraine without aura and without status migrainosus, not intractable 10/30/2016  . Irritant contact  dermatitis due to frequent handwashing 06/08/2016  . Lumbar radiculopathy 05/15/2016  . Osteoarthritis of spine with radiculopathy, lumbar region 05/15/2016  . Risk for falls 05/15/2016  . Acute medial meniscal tear, left, subsequent encounter 04/23/2016  . Recurrent infections 03/29/2016  . Chronic nonseasonal allergic rhinitis due to pollen 03/29/2016  . Polypharmacy 01/16/2016  . Morbid obesity with BMI of 50.0-59.9, adult ([place]) 03/06/2015  . SDAT 02/05/2015  . Asthma 02/05/2015  . OSA and COPD overlap syndrome ([place]) 02/05/2015  . Medication management 08/02/2014  . GERD (gastroesophageal reflux disease) 05/09/2014  . Vitamin D deficiency 08/01/2013  . Prediabetes 08/01/2013  . 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 Diverticula of colon 05/07/2011  . Hypertension 01/31/2011  . Hyperlipidemia, mixed 01/31/2011  . BPH (benign prostatic hyperplasia) 01/31/2011  . Testosterone Deficiency 01/31/2011  . IBS (irritable bowel syndrome) 01/31/2011  . Partial complex seizure disorder with intractable epilepsy ([place]) 01/31/2011  . Depression, major, recurrent, in partial remission ([place]) 01/31/2011  . Asthma-COPD overlap syndrome ([place]) 01/31/2011    Past Surgical History:  Procedure Laterality Date  . ABDOMINAL SURGERY    . ANKLE FRACTURE SURGERY Right   . CYSTOSCOPY     [place]  . KNEE ARTHROSCOPY WITH MEDIAL MENISECTOMY Left 01/02/2017   Procedure: LEFT KNEE ARTHROSCOPY WITH PARTIAL MEDIAL MENISCECTOMY;  Surgeon: Mcarthur Rossetti, MD;  Location: [facility];  Service: Orthopedics;  Laterality: Left;  . TONSILLECTOMY    .  [facility] RESECTION  2007  . UVULOPALATOPHARYNGOPLASTY          Home Medications    Prior to Admission medications   Medication Sig Start Date End Date Taking? Authorizing Provider  albuterol (PROVENTIL HFA;VENTOLIN HFA) 108 (90 Base) MCG/ACT inhaler 1 to 2 inhalations 10 to 15 minutes apart every 4 hrs to rescue Asthma 02/24/18   [name], MD    albuterol (PROVENTIL) (2.5 MG/3ML) 0.083% nebulizer solution Inhale 3 mLs (2.5 mg total) into the lungs every 4 (four) hours as needed. 02/24/18   [name], MD  alfuzosin (UROXATRAL) 10 MG 24 hr tablet Take 10 mg by mouth daily.      [provider]  anastrozole (ARIMIDEX) 1 MG tablet daily. 08/08/15   [provider]  baclofen (LIORESAL) 10 MG tablet Take 10 mg by mouth 3 (three) times daily as needed for muscle spasms.  03/03/16   [provider]  benzonatate (TESSALON) 200 MG capsule Take 1 perle 3 x / day to prevent cough 03/05/18   [name], MD  bisoprolol-hydrochlorothiazide [facility]) 5-6.25 MG tablet TAKE 1 TABLET BY MOUTH EVERY DAY 01/24/18   [name], MD  Botulinum Toxin Type A 200 units SOLR Inject into the skin. 09/07/14   [provider]  Calcium Carbonate-Vitamin D (CALCIUM + D PO) Take 500 mg by mouth 3 (three) times daily.     [provider]  cetaphil (CETAPHIL) lotion Apply topically 2 (two) times daily. 06/09/16   Lavina Hamman, MD  cetirizine (ZYRTEC) 10 MG tablet Take 10 mg by mouth daily.    [provider]  chlorpheniramine-HYDROcodone (TUSSIONEX) 10-8 MG/5ML SUER Take 5 mLs every 12 (twelve) hours by mouth. 04/03/17   Vicie Mutters, PA-C  Cholecalciferol (VITAMIN D PO) Take 10,000-20,000 Units by mouth 2 (two) times daily. 20000 in the morning and 10000 in the evening    [provider]  Cinnamon 500 MG capsule Take by mouth.    [provider]  clotrimazole-betamethasone (LOTRISONE) cream APPLY TO AFFECTED AREA TWICE A DAY 05/27/17   Vicie Mutters, PA-C  co-enzyme Q-10 30 MG capsule Take 30 mg by mouth daily.    [provider]  cyclobenzaprine (FLEXERIL) 10 MG tablet Take 10 mg by mouth 3 (three) times daily.     [provider]  desmopressin (DDAVP) 0.2 MG tablet Take 200 mcg by mouth daily.  04/10/14   [provider]  diclofenac (VOLTAREN) 75 MG EC  tablet Take 1 tablet (75 mg total) by mouth 2 (two) times daily. 03/20/17   Mcarthur Rossetti, MD  diclofenac sodium (VOLTAREN) 1 % GEL APPLY 4 GRAMS TOPICALLY 4 TIMES DAILY 12/12/17   [name], MD  donepezil (ARICEPT) 23 MG TABS tablet Take 23 mg by mouth at bedtime.    [provider]  doxycycline (VIBRAMYCIN) 100 MG capsule Take 100 mg by mouth 2 (two) times daily.    [provider]  Dupilumab, Asthma, (DUPIXENT) 200 MG/1.14ML SOSY Inject into the skin. Injects bi-weekly    [provider]  DYMISTA 137-50 MCG/ACT SUSP PLACE 2 SPRAYS INTO BOTH NOSTRILS 2 (TWO) TIMES DAILY. 02/24/18   [name], MD  Eluxadoline (VIBERZI) 100 MG TABS Take 1 tablet by mouth 2 (two) times daily.     [provider]  EPINEPHrine 0.3 mg/0.3 mL IJ SOAJ injection Inject as directed.    [provider]  Fluticasone-Umeclidin-Vilant (TRELEGY ELLIPTA) 100-62.5-25 MCG/INH AEPB Inhale 1 puff into the  lungs daily. 11/09/17   [name], MD  furosemide (LASIX) 80 MG tablet Take 1 to 1 & 1/2 tablets 2 x /day for Fluid Retention & Swelling of Legs 02/02/18   [name], MD  gabapentin (NEURONTIN) 800 MG tablet Take 1 tablet (800 mg total) by mouth 3 (three) times daily. 02/17/18   [name], MD  Galcanezumab-gnlm [facility]) 120 MG/ML SOAJ Inject into the skin. Pt receives this monthly ingection    [provider]  GLUCOSAMINE-CHONDROITIN PO Take 3 tablets by mouth daily.    [provider]  guaiFENesin (MUCINEX) 600 MG 12 hr tablet Take 600 mg by mouth 2 (two) times daily as needed for cough or to loosen phlegm. Instructed to take 3 tablets in the morning    [provider]  guaiFENesin-codeine 100-10 MG/5ML syrup Take 5 mLs by mouth 3 (three) times daily as needed for cough. 02/24/18 06/24/18  [name], MD  HYDROcodone-homatropine [facility]) 5-1.5 MG/5ML syrup Take 5 mLs by mouth every 6 (six) hours as  needed for cough. Do not take with other opioids such as tramadol. 08/06/17   [name], MD  KRILL OIL PO Take by mouth.    [provider]  LUTEIN PO Take 1 tablet by mouth 2 (two) times daily.    [provider]  memantine (NAMENDA) 10 MG tablet Take 10 mg by mouth 2 (two) times daily.     [provider]  metFORMIN (GLUCOPHAGE-XR) 500 MG 24 hr tablet Take 2 tablets 2 x /day with Breakfast & Supper  for Diabetes 02/25/18   [name], MD  Methylfol-Algae-B12-Acetylcyst (CEREFOLIN NAC) 6-90.314-2-600 MG TABS Take 1 tablet by mouth daily.  08/03/12   [provider]  metolazone (ZAROXOLYN) 5 MG tablet Take 1 tablet daily for Fluid Retention 03/19/18   [name], MD  montelukast (SINGULAIR) 10 MG tablet TAKE 1 TABLET BY MOUTH EVERY DAY 11/09/17   [name], MD  Multiple Vitamins-Minerals (MULTIVITAMIN WITH MINERALS) tablet Take 1 tablet by mouth daily.    [provider]  naloxone [facility]) nasal spray 4 mg/0.1 mL USE AS DIRECTED 03/14/17   [provider]  ondansetron (ZOFRAN-ODT) 4 MG disintegrating tablet TAKE 1 TABLET (4 MG TOTAL) BY MOUTH EVERY 6 (SIX) HOURS AS NEEDED FOR NAUSEA OR VOMITING. 03/20/17   [name], MD  oxyCODONE-acetaminophen (PERCOCET) 10-325 MG tablet Take 1 tablet by mouth every 4 (four) hours as needed for pain. 01/02/17   Mcarthur Rossetti, MD  oxymetazoline (AFRIN) 0.05 % nasal spray Place 1 spray into both nostrils See admin instructions. 1 SPRAY PER SIDE EACH NIGHT BEFORE DYMISTA    [provider]  pentosan polysulfate (ELMIRON) 100 MG capsule Take 100 mg by mouth 3 (three) times daily before meals.     [provider]  Phosphatidylserine-DHA-EPA (VAYACOG PO) Take 1 capsule by mouth 2 (two) times daily. 06/02/16   [provider]  potassium chloride SA (K-DUR,KLOR-CON) 20 MEQ tablet Take 1 tablet 2 x /day  For potassium 02/25/18   [name], MD    pravastatin (PRAVACHOL) 40 MG tablet TAKE 1 TABLET BY MOUTH EVERYDAY AT BEDTIME 02/22/18   [name], MD  PRESCRIPTION MEDICATION Pt receives weekly allergy shots    [provider]  PROBIOTIC PRODUCT PO Take by mouth.    [provider]  promethazine-dextromethorphan (PROMETHAZINE-DM) 6.25-15 MG/5ML syrup Take 1 to 2 tsp enery 4 hours if needed for cough 03/05/18   [name], MD  ranitidine (ZANTAC) 150 MG capsule Take 1 capsule (150 mg total) by mouth 2 (two) times daily. 02/24/18   [name], MD  sodium chloride (OCEAN) 0.65 % SOLN nasal spray Place 1 spray into both nostrils 4 (four) times daily as needed for congestion. Uses each time before other nasal sprays    [provider]  sulfamethoxazole-trimethoprim (BACTRIM DS,SEPTRA DS) 800-160 MG tablet Take 1 tablet 2 x /day on a full stomach for infection 03/18/18   [name], MD  tadalafil (CIALIS) 5 MG tablet Take 5 mg by mouth every evening.     [provider]  traMADol (ULTRAM) 50 MG tablet TAKE 1 TABLET EVERY 6 HOURS AS NEEDED FOR COUGH 05/27/17   Vicie Mutters, PA-C  triamcinolone cream (KENALOG) 0.1 % APPLY TOPICALLY 4 TIMES A DAY Patient taking differently: APPLY TOPICALLY 2 TIMES A DAY AS NEEDED for itching 06/09/16   Rolene Course, PA-C  trospium (SANCTURA) 20 MG tablet Take 20 mg by mouth 2 (two) times daily.  06/22/15   [provider]  TURMERIC PO Take 3 capsules by mouth daily.    [provider]    Family History Family History  Problem Relation Age of Onset  . Diabetes Paternal Uncle   . Cancer Father        lymphoma, colon  . Diabetes Maternal Grandmother   . Heart disease Maternal Grandfather   . Diabetes Maternal Grandfather   . Diabetes Paternal Grandmother   . Diabetes Paternal Grandfather   . Dementia Mother   . Prostate cancer Maternal Uncle   . Lung disease Neg Hx   . Rheumatologic disease Neg Hx     Social  History Social History   Tobacco Use  . Smoking status: Former Smoker    Packs/day: 0.10    Years: 15.00    Pack years: 1.50  . Smokeless tobacco: Never Used  . Tobacco comment: significant second-hand exposure through mother  Substance Use Topics  . Alcohol use: Yes    Alcohol/week: 1.0 standard drinks    Types: 1 Glasses of wine per week    Comment: 2 x a year  . Drug use: No     Allergies   Bee venom; Ppd [tuberculin purified protein derivative]; Fenofibrate; Levofloxacin; Other; Verapamil; Claritin [loratadine]; and Duloxetine   Review of Systems Review of Systems  Constitutional: Negative for chills, diaphoresis, fatigue and fever.  HENT: Negative for congestion.   Eyes: Negative for visual disturbance.  Respiratory: Negative for cough, chest tightness, shortness of breath, wheezing and stridor.   Cardiovascular: Negative for chest pain.  Gastrointestinal: Negative for abdominal pain, diarrhea, nausea and vomiting.  Genitourinary: Negative for flank pain.  Musculoskeletal: Positive for back pain. Negative for neck pain and neck stiffness.  Skin: Negative for rash and wound.  Neurological: Positive for weakness and numbness. Negative for dizziness, light-headedness and headaches.  All other systems reviewed and are negative.    Physical Exam Updated Vital Signs BP 105/75   Pulse 85   Temp 98.7 F (37.1 C) (Oral)   Resp 18   Ht 6' (1.829 m)   Wt (!) 181.4 kg   SpO2 93%   BMI 54.25 kg/m   Physical Exam  Constitutional: He is oriented to person, place, and time. He appears well-developed and well-nourished. No distress.  HENT:  Head: Normocephalic and atraumatic.  Right Ear: External ear normal.  Left Ear: External ear normal.  Nose: Nose normal.  Mouth/Throat: Oropharynx is clear and moist. No  oropharyngeal exudate.  Eyes: Pupils are equal, round, and reactive to light. Conjunctivae and EOM are normal.  Neck: Normal range of motion. Neck supple.   Pulmonary/Chest: Effort normal. No stridor. No respiratory distress. He has no wheezes. He has no rales. He exhibits no tenderness.  Abdominal: Soft. He exhibits no distension. There is no tenderness. There is no rebound and no guarding.  Musculoskeletal: He exhibits tenderness. He exhibits no edema.       Lumbar back: He exhibits tenderness and pain.       Back:  Neurological: He is alert and oriented to person, place, and time. He is not disoriented. He displays no tremor and normal reflexes. No cranial nerve deficit or sensory deficit. He exhibits normal muscle tone. He displays no seizure activity. Coordination and gait normal. GCS eye subscore is 4. GCS verbal subscore is 5. GCS motor subscore is 6.  Patient had 5 out of 5 strength in both legs however he subjectively felt weaker.  Normal sensation and pulses distally.  Negative straight leg raise bilaterally.  Patient could walk on reassessment.  Skin: Skin is warm. Capillary refill takes less than 2 seconds. No rash noted. He is not diaphoretic. No erythema. No pallor.     ED Treatments / Results  Labs (all labs ordered are listed, but only abnormal results are displayed) Labs Reviewed - No data to display  EKG None  Radiology Ct Lumbar Spine Wo Contrast  Result Date: 03/25/2018 CLINICAL DATA:  Unwitnessed fall.  Patient fell against a door. EXAM: CT LUMBAR SPINE WITHOUT CONTRAST TECHNIQUE: Multidetector CT imaging of the lumbar spine was performed without intravenous contrast administration. Multiplanar CT image reconstructions were also generated. COMPARISON:  CT 07/01/2016 FINDINGS: Segmentation: 5 lumbar type vertebrae. Alignment: Normal. Vertebrae: No acute fracture or focal pathologic process. There is osteophytosis anteriorly along the lower thoracic and upper lumbar spine from T11 through L2 and to a lesser degree the lower lumbar spine at L4 and L5. Paraspinal and other soft tissues: Negative. Disc levels: Mild disc  flattening with vacuum disc phenomena L5-S1. No pars defects or listhesis. IMPRESSION: No acute lumbar spine fracture.  Degenerative disc disease L5-S1. Electronically Signed   By: Ashley Royalty M.D.   On: 03/25/2018 21:17    Procedures Procedures (including critical care time)  Medications Ordered in ED Medications  lidocaine (LIDODERM) 5 % 1 patch (1 patch Transdermal Patch Applied 03/25/18 2309)  morphine 4 MG/ML injection 4 mg (4 mg Intravenous Given 03/25/18 1854)  morphine 4 MG/ML injection 4 mg (4 mg Intravenous Given 03/25/18 2037)     Initial Impression / Assessment and Plan / ED Course  I have reviewed the triage vital signs and the nursing notes.  Pertinent labs & imaging results that were available during my care of the patient were reviewed by me and considered in my medical decision making (see chart for details).     Mark Harrell is a 57 y.o. male with past medical history significant for hypertension, hyperlipidemia, COPD on home oxygen at baseline, chronic back pains, and morbid obesity who presents from PCP office for fall.  Patient reports that he was recently on antibiotics for pulmonary infection with cough and he reports this has been doing well.  He reports that he was going to see his general practitioner today and while getting ready to go to the visit he was wearing socks on hardwood floor and slipped and fell.  He reports falling backwards into a door with his  low back and then slid to the ground.  He reports he had immediate worsening of back pain much worse in his chronic mild back pains.  He says that he was having bilateral leg weakness and numbness making it difficult for him to ambulate.  He was able to get to the doctor's appointment and when he was seen by his PCP, was immediately sent to the emergency department for evaluation and likely MRI to look for neural injury in his low back.  He denies loss of bowel or bladder control.  He reports he is able to ambulate  with extreme difficulty and pain.  He reports that he had severe numbness in both of his lower extremities that has resolved.  He still feels weak in both legs.  He denies any nausea, vomiting, headache, chest pain, shortness of breath, palpitations, or abdominal pain.  He denies any hip pains.  He describes the pain currently as a 9 out of 10.    On exam, patient had no numbness in the lower extremity's.  Patient was bedded in the hall, rectal exam initially deferred.  Patient had no weakness on my exam and was able to lift both legs against gravity and with pressure.  He had symmetric strength.  Subjectively patient felt weak but he had normal plantarflexion and dorsiflexion of his ankles.  Patient had no radiation of his low back pain with straight leg raise testing but he did have pain in the low back.  Tenderness present in the low back.  Exam otherwise unremarkable.    Due to the transient numbness and the reported subjective weakness in both legs after a fall and hitting his back, patient will have MRI of his lumbar spine.  He will be given pain medicine.    Suspect acute on chronic worsening of low back pain however will get imaging to look for normality.  8:19 PM MRI technician called report that patient's girth size is too large for the scanner.  Machine was unable to scan him.    Shared decision making conversation held with patient.  We will obtain CT imaging to look for acute fracture or dislocation however if CT is reassuring, patient will likely be stable for discharge home for outpatient MRI with his spine team and PCP.  Patient is in agreement with this plan and more pain medicine was ordered.  CT showed no fracture or dislocation.  Degenerative disease seen.  Patient was able to ambulate and had improvement in his subjective leg weakness.  Pain was improved with pain medication and Lidoderm patch.  Patient will follow-up with outpatient spine team and PCP to discuss outpatient  imaging.  Patient discharged in good condition with improved symptoms and no focal neurologic deficits on reassessment.  Patient could walk at discharge.  Final Clinical Impressions(s) / ED Diagnoses   Final diagnoses:  Fall, initial encounter  Acute low back pain, unspecified back pain laterality, unspecified whether sciatica present    ED Discharge Orders         Ordered    lidocaine (LIDODERM) 5 %  Every 24 hours     03/25/18 2315    oxyCODONE-acetaminophen (PERCOCET/ROXICET) 5-325 MG tablet  Every 4 hours PRN     03/25/18 2315         Clinical Impression: 1. Fall, initial encounter   2. Acute low back pain, unspecified back pain laterality, unspecified whether sciatica present     Disposition: Discharge  Condition: Good  I have discussed the  results, Dx and Tx plan with the pt(& family if present). He/she/they expressed understanding and agree(s) with the plan. Discharge instructions discussed at great length. Strict return precautions discussed and pt &/or family have verbalized understanding of the instructions. No further questions at time of discharge.    Discharge Medication List as of 03/25/2018 11:16 PM    START taking these medications   Details  lidocaine (LIDODERM) 5 % Place 1 patch onto the skin daily. Remove & Discard patch within 12 hours or as directed by MD, Starting Thu 03/25/2018, Print    oxyCODONE-acetaminophen (PERCOCET/ROXICET) 5-325 MG tablet Take 1 tablet by mouth every 4 (four) hours as needed for severe pain., Starting Thu 03/25/2018, Print        Follow Up: [name], [place] [id number] [phone]     [place] [address] [id number] mc [place]       Imari Sivertsen, Gwenyth Allegra, MD 03/26/18 [phone]

## 2018-03-25 NOTE — Discharge Instructions (Signed)
Your fall likely exacerbated your chronic back pain.  Due to the transient numbness and weakness you had, we were going to get an MRI however due to your body shape, we were unable to collect those images today.  As we discussed in our shared decision-making conversation, we obtained a CT scan instead and did not see any evidence of fracture, dislocation, or other abnormality aside from your chronic degenerative disease.  As your symptoms have improved, we feel comfortable letting you go home to follow-up with your outpatient spine team and PCP for further pain management and further work-up.  Please use the pain medicine and the Lidoderm patch to help with your symptoms.  If any symptoms change or worsen, please return to the nearest emergency department.  Please be careful not to fall before you follow-up.

## 2018-03-25 NOTE — Progress Notes (Signed)
  Subjective:    Patient ID: Mark Harrell, male    DOB: [birth year], 57 y.o.   MRN: [id number]  HPI   This  57 yo single WM with multiple Co-morbidities including Chronic Bronchitis /Restrictive Lung Disease and recent flare -up of AECB w/Asthma and who gas been on O2 returns for f/u after  CXR on 03/18/2018 showed clear lung fields. He returns today for follow-up.     Patient presented to office today relating that he had fallen earlier today at home and was brought today by his uncle. As he walked into the office he developed increasing LBP and numbness/weakness of his lower extremities. Patient related tat he was unable to stand from the chair he was sitting in the lobby and not able to walk back in the office to an exam room.     Objective:   Physical Exam  BP 132/80   Pulse 76   Temp (!) 97.3 F (36.3 C)   Resp 16   Brief triage indicated in no respiratory distress. Was able to minimally move LE's due to pain.     Assessment & Plan:   Patient was advised EMS is contacted for transport to ER for further diagnostic testing.

## 2018-03-29 ENCOUNTER — Ambulatory Visit (INDEPENDENT_AMBULATORY_CARE_PROVIDER_SITE_OTHER): Payer: BLUE CROSS/BLUE SHIELD | Admitting: Internal Medicine

## 2018-03-29 ENCOUNTER — Telehealth: Payer: Self-pay

## 2018-03-29 ENCOUNTER — Ambulatory Visit: Payer: BLUE CROSS/BLUE SHIELD | Admitting: Adult Health

## 2018-03-29 VITALS — BP 126/84 | HR 72 | Temp 97.9°F | Resp 20 | Ht 72.0 in | Wt 399.9 lb

## 2018-03-29 DIAGNOSIS — J40 Bronchitis, not specified as acute or chronic: Secondary | ICD-10-CM | POA: Diagnosis not present

## 2018-03-29 MED ORDER — MOXIFLOXACIN HCL 400 MG PO TABS: ORAL_TABLET | ORAL | 0 refills | Status: DC

## 2018-03-29 NOTE — Progress Notes (Signed)
Subjective:    Patient ID: Mark Harrell, male    DOB: [birth year], 57 y.o.   MRN: [id number]  HPI     This nice 57 yo single WM with supra Morbid Obesity  (BMI 54+) and co-morbidities including COPD/Restrictive Lung Disease on O2, Chronic Venous Insufficiency, OSA w/mask intolerance, TBI/mild OBS, HTN, HLD, T2_DM and Vit D Deficiency returns for 1 week f/u after ER evaluation after a fall on 10/31 and had Lumbar CT scan and then was referred to be seen 11/6 at [facility] for ongoing LBP. Patient ambulated in for his app't today. His primary c/o today relate to ongoing productive cough and yellowish green sputum. Using his O2 during the night and intermittently through out the day.  Denies fever, chills, sweats or rash.   Medication Sig  . albuterol HFA  inhaler 1 to 2 inhalations 10 to 15 minutes apart every 4 hrs  . albuterol(2.5 MG/3ML 0.083% neb soln Inhale 3 mLs  every 4hours as needed.  [name] alfuzosin  10 MG 24 hr  Take  daily.    [name] anastrozole  1 MG daily.  . Baclofen 10 MG  Take 3  times daily as needed for muscle spasms.   . benzonatate  200 MG  Take 1 perle 3 x / day to prevent cough  . bisoprolol-hctz 5-6.25  TAKE 1 TABLETEVERY DAY  . Botulinum Toxin Type A 200 units SOLR Inject into the skin.  [name] CALCIUM + D Take 500 mg  3  times daily.   . cetaphil  Lotion Apply topically 2 (two) times daily.  . cetirizine10 MG tablet Take 10 mg by mouth daily.  Amanda Cockayne 10-8 MG/5ML Take 5 mLs every 12 (twelve) hours by mouth.  [name] VITAMIN D Take 20000 in the morning and 10000 in the evening  . Cinnamon 500 MG Takedaily  . LOTRISONE crm APPLY TO AFFECTED AREA TWICE A DAY  . co-enzyme Q-10 30 MG  Take 30 mg  daily.  . cyclobenzaprine 10 MG tablet Take 3 (three) times daily.   . DDAVP 0.2 MG tablet Take 200 mcg  daily.   . Diclofenac 75 MG EC Take 1 tablet  2 times daily.  . diclofenac1 % GEL APPLY 4 GRAMS TOPICALLY 4 TIMES DAILY  . donepezil (ARICEPT) 23 MG TABS Take 23 mg  at bedtime.  .  Dupilumab (DUPIXENT) 200 MG/1.14ML  Inject into the skin. Injects bi-weekly  . DYMISTA 137-50 MCG/ACT SUSP PLACE 2 SPRAYS INTO  NOSTRILS 2  TIMES DAILY.  [name] VIBERZI 100 MG TABS Take 1 tablet  2 times daily.   . TRELEGY ELLIPTA 100-62.5-25  Inhale 1 puff into the lungs daily.  . furosemide (LASIX) 80 MG tablet Take 1 to 1 & 1/2 tablets 2 x /day   . gabapentin  800 MG tablet Take 1 tablet  3times daily.  [name] Galcanezumab-gnlm (EMGALITY) 120 MG/ML SOAJ Inject into the skin. Pt receives this monthly ingection  . GLUCOSAMINE-CHONDROITIN  Take 3 tabletsdaily.  [name] guaiFENesin  600 MG 12 hr tablet Take 600 mg by mouth 2 (two) times daily as needed for cough or to loosen phlegm. Instructed to take 3 tablets in the morning  . guaiFENesin-codeine 100-10 MG/5ML syrup Take 5 mLs by mouth 3 (three) times daily as needed for cough.  [name] HYDROcodone-homatro  5-1.5 MG/5ML syrup Take 5 mLs  every 6 hours as needed   . KRILL OIL PO Take by mouth.  . lidocaine (LIDODERM) 5 % Place 1  patch onto the skin daily.  . LUTEIN PO Take 1 tablet by mouth 2 (two) times daily.  . memantine (NAMENDA) 10 MG tablet Take 10 mg by mouth 2 (two) times daily.   . metFORMIN-XR 500 MG  Take 2 tablets 2 x /day with Breakfast & Supper  for Diabetes  . CEREFOLIN  6-90.314-2-600 MG TABS Take 1 tablet by mouth daily.   . metolazone  5 MG tablet Take 1 tablet daily for Fluid Retention  . montelukast  10 MG tablet TAKE 1 TABLET BY MOUTH EVERY DAY  . Multiple Vitamins-Minerals  Take 1 tablet by mouth daily.  . naloxone (NARCAN) nasal spry 4 mg/0.1 mL USE AS DIRECTED  . ondansetron-ODT 4 MG  TAKE 1 TABLET  EVERY 6 HOURS AS NEEDED   . oxyCODONE-APAP 10-325 MG tablet Take 1 tablet every 4 hours as needed for pain.  [name] oxyCODONE-acetaminophen  5-325 MG  Take 1 tablet  every 4 ) hours as needed for severe pain.  [name] oxymetazoline (AFRIN) 0.05 % nasal spray Place 1 spray into both nostrils   . ELMIRON 100 MG capsule Take 100 mg by mouth 3 (three) times  daily before meals.   . Phosphatidylserine-DHA-EPA (VAYACOG) Take 1 capsule  2  times daily.  . potassium chloride  20 MEQ tablet Take 1 tablet 2 x /day  For potassium  . pravastatin  40 MG tablet TAKE 1 TABLET  EVERYDAY AT BEDTIME  . Pt receives weekly allergy shots   . PROBIOTIC PRODUCT  Takedaily   . Phenergan-DM 6.25-15 MG/5ML syrup Take 1 to 2 tsp enery 4 hours if needed for cough  . ranitidine 150 MG capsule Take 1 capsule  2  times daily.  . sodium chloride 0.65 % SOLN nasal spray Place 1 spray into both nostrils 4 times daily as needed   . tadalafil (CIALIS) 5 MG tablet Take 5 mg by mouth every evening.   . traMADo 50 MG tablet TAKE 1 TABLET EVERY 6 HRS   . KENALOG 0.1 % APPLY TOPICALLY 4 TIMES A DAYAS NEEDED   . trospium (SANCTURA) 20 MG tablet Take 20 mg by mouth 2 (two) times daily.   . TURMERIC Take 3 capsules by mouth daily.   Allergies  Allergen Reactions  . Bee Venom Swelling  . Ppd [Tuberculin Purified Protein Derivative] Other (See Comments)    +ppd NEG Quantferron Gold 3/13  . Fenofibrate Other (See Comments)    Back pain  . Levofloxacin Diarrhea  . Other Other (See Comments)    Some antibiotics cause diarrhea  . Verapamil Other (See Comments)    Back pain  . Claritin [Loratadine] Other (See Comments)    unknown  . Duloxetine     Brought on asthma   Past Medical History:  Diagnosis Date  . Anesthesia complication requiring reversal agent administration    ? from central apnea, very difficult to get off vent  . Anxiety   . Arthritis    osteo  . Asthma   . BPH (benign prostatic hyperplasia)   . Complication of anesthesia    difficulty waking , they twlight me because of my respiratory problems "  . Dyspnea   . Enlarged heart   . Family history of adverse reaction to anesthesia    mother trouble waking up, and heart stopped  . GERD (gastroesophageal reflux disease)   . Headache    botox injections for headaches  . Hyperlipidemia   . Hypertension   .  Hypogonadism  male   . IBS (irritable bowel syndrome)   . Obesity   . OSA (obstructive sleep apnea)    cpap  . Pneumonia   . Pre-diabetes   . Prostatitis    Review of Systems     Objective:   Physical Exam  BP 126/84   Pulse 72   Temp 97.9 F (36.6 C)   Resp 20   Ht 6' (1.829 m)   Wt (!) 399 lb 14.6 oz (181.4 kg)   BMI 54.24 kg/m   O2 Sat 94% on Rm Air at rest.  No Stridor  HEENT - WNL. Neck - supple.  Chest - Few scattered rales & Rhonchi Cor - Nl HS. RRR w/o sig MGR. PP 1(+). No edema. MS- FROM w/o deformities.  Gait Nl. Neuro -  Nl w/o focal abnormalities.    Assessment & Plan:    1. Bronchitis  - moxifloxacin (AVELOX) 400 MG tablet; Take 1 tablet daily with food for Infection (take Imodium / Loperamide if get Diarrhea)  Dispense: 14 tablet  - Discussed meds / SE's and return or ER precautions

## 2018-03-29 NOTE — Telephone Encounter (Signed)
-----   Message from Satira Anis sent at 03/29/2018 12:53 PM EST ----- Regarding: New DME Please see below from [place] w/ [place] & advise:   Jyl Heinz D    Yes. This patient needs to have a F2F with the doctor and the order needs to be corrected stating that concentrator and portable concentrator.  Previous Messages     ----- Message -----  From: Vella Kohler D  Sent: 03/29/2018  9:10 AM EST  To: Mauri Brooklyn  Subject: FW: O2 - POC                   Did you get this one?   ----- Message -----  From: Satira Anis  Sent: 03/24/2018 12:35 PM EST  To: Cletis [place], Gilda Brockington  Subject: O2 - POC                     DOB: June 15, 2060.   Order placed by Dr. [name]. Pt has used [facility] less than a year for O2, but would like a new DME.    Please advise,  [facility]

## 2018-03-29 NOTE — Telephone Encounter (Signed)
Patient was contacted last week regarding below information regarding switching DME companies. Pt verbalized understanding of new ov is needed for face to face and O2 qualifying walk in office. Scheduled appt with TP Friday 04-02-18 at 2:30pm  Advised [facility] today with above info regarding conversation with pt last week. Nothing further needed at this time.

## 2018-03-31 ENCOUNTER — Other Ambulatory Visit (INDEPENDENT_AMBULATORY_CARE_PROVIDER_SITE_OTHER): Payer: Self-pay | Admitting: Physician Assistant

## 2018-03-31 ENCOUNTER — Ambulatory Visit (INDEPENDENT_AMBULATORY_CARE_PROVIDER_SITE_OTHER): Payer: BLUE CROSS/BLUE SHIELD | Admitting: Orthopaedic Surgery

## 2018-03-31 ENCOUNTER — Encounter (INDEPENDENT_AMBULATORY_CARE_PROVIDER_SITE_OTHER): Payer: Self-pay | Admitting: Orthopaedic Surgery

## 2018-03-31 DIAGNOSIS — M5441 Lumbago with sciatica, right side: Secondary | ICD-10-CM | POA: Diagnosis not present

## 2018-03-31 DIAGNOSIS — M545 Low back pain, unspecified: Secondary | ICD-10-CM

## 2018-03-31 DIAGNOSIS — M5442 Lumbago with sciatica, left side: Secondary | ICD-10-CM

## 2018-03-31 NOTE — Progress Notes (Signed)
Office Visit Note   Patient: Mark Harrell           Date of Birth: [birth year]           MRN: [id number] Visit Date: 03/31/2018              Requested by: [name], [address] [place], [place] [address] PCP: [name], MD   Assessment & Plan: Visit Diagnoses: No diagnosis found.  Plan: We will obtain an MRI to rule out HNP as a source of his radicular symptoms down both legs and his low back pain.  Having follow-up after the MRI to go over results and discuss further treatment discussed with him at this point time he is on the appropriate medications having Percocet, lidocaine patches, chronic Neurontin, and Flexeril.  Suggest that he apply moist heat to low back.  Follow-Up Instructions: Return for After MRI.   Orders:  No orders of the defined types were placed in this encounter.  No orders of the defined types were placed in this encounter.     Procedures: No procedures performed   Clinical Data: No additional findings.   Subjective: Chief Complaint  Patient presents with  . Lower Back - Pain    HPI Mr. Rip Harbour is a 57 year old male who comes in today due to an acute fall on 03/25/2018.  He was seen in the ER after the fall due to low back pain.  He states that he was trying to put on his shoes last Thursday and fell forward catching his legs underneath the desk and falling backwards and hitting a wall.  He denies any loss of consciousness dizziness or chest pain.  Continues to have some low back pain that awakens him.  Said no saddle anesthesia like symptoms.  He has had some diarrhea due to the fact he is on antibiotics for an upper respiratory infection but otherwise no change in bowel bladder function.  He has been given Percocet by ER.  He is on chronic gabapentin and Flexeril. CT scan of his lumbar spine dated 03/25/2018 showed no acute lumbar spinal fracture.  Degenerative disc disease at L5-S1.  No pars defects or spinal  listhesis.  Review of Systems See HPI otherwise negative  Objective: Vital Signs: There were no vitals taken for this visit.  Physical Exam  Constitutional: He is oriented to person, place, and time. He appears well-developed. No distress.  Obese BMI 54.3  Cardiovascular: Intact distal pulses.  Pulmonary/Chest: Effort normal.  Neurological: He is alert and oriented to person, place, and time.  Skin: He is not diaphoretic.  Psychiatric: He has a normal mood and affect.    Ortho Exam Positive straight leg raise bilaterally.  5 out of 5 strength throughout lower extremities against resistance good range of motion of the bilateral hips and knees.  He ambulates with a slow unsteady gait using a cane.  Deep tendon reflexes are 1+ at the knees and ankles and equal and symmetric.  Sensation subjectively intact bilateral feet light touch.  Full dorsiflexion plantarflexion bilateral ankles.  Pitting edema bilateral lower remedies. Specialty Comments:  No specialty comments available.  Imaging: No results found.   PMFS History: Patient Active Problem List   Diagnosis Date Noted  . Bilateral lower extremity edema 12/29/2017  . Hymenoptera allergy 11/10/2017  . Seasonal and perennial allergic rhinitis 04/23/2017  . 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 Cervicalgia 04/01/2017  . Headache 11/13/2016  . Migraine without aura and without  status migrainosus, not intractable 10/30/2016  . Irritant contact dermatitis due to frequent handwashing 06/08/2016  . Lumbar radiculopathy 05/15/2016  . Osteoarthritis of spine with radiculopathy, lumbar region 05/15/2016  . Risk for falls 05/15/2016  . Acute medial meniscal tear, left, subsequent encounter 04/23/2016  . Recurrent infections 03/29/2016  . Chronic nonseasonal allergic rhinitis due to pollen 03/29/2016  . Polypharmacy 01/16/2016  . Morbid obesity with BMI of 50.0-59.9, adult ([place]) 03/06/2015  . SDAT 02/05/2015  . Asthma 02/05/2015  . OSA  and COPD overlap syndrome ([place]) 02/05/2015  . Medication management 08/02/2014  . GERD (gastroesophageal reflux disease) 05/09/2014  . Vitamin D deficiency 08/01/2013  . Prediabetes 08/01/2013  . 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 Diverticula of colon 05/07/2011  . Hypertension 01/31/2011  . Hyperlipidemia, mixed 01/31/2011  . BPH (benign prostatic hyperplasia) 01/31/2011  . Testosterone Deficiency 01/31/2011  . IBS (irritable bowel syndrome) 01/31/2011  . Partial complex seizure disorder with intractable epilepsy ([place]) 01/31/2011  . Depression, major, recurrent, in partial remission ([place]) 01/31/2011  . Asthma-COPD overlap syndrome ([place]) 01/31/2011   Past Medical History:  Diagnosis Date  . Anesthesia complication requiring reversal agent administration    ? from central apnea, very difficult to get off vent  . Anxiety   . Arthritis    osteo  . Asthma   . BPH (benign prostatic hyperplasia)   . Complication of anesthesia    difficulty waking , they twlight me because of my respiratory problems "  . Dyspnea   . Enlarged heart   . Family history of adverse reaction to anesthesia    mother trouble waking up, and heart stopped  . GERD (gastroesophageal reflux disease)   . Headache    botox injections for headaches  . Hyperlipidemia   . Hypertension   . Hypogonadism male   . IBS (irritable bowel syndrome)   . Obesity   . OSA (obstructive sleep apnea)    cpap  . Pneumonia   . Pre-diabetes   . Prostatitis     Family History  Problem Relation Age of Onset  . Diabetes Paternal Uncle   . Cancer Father        lymphoma, colon  . Diabetes Maternal Grandmother   . Heart disease Maternal Grandfather   . Diabetes Maternal Grandfather   . Diabetes Paternal Grandmother   . Diabetes Paternal Grandfather   . Dementia Mother   . Prostate cancer Maternal Uncle   . Lung disease Neg Hx   . Rheumatologic disease Neg Hx     Past Surgical History:  Procedure Laterality Date  . ABDOMINAL  SURGERY    . ANKLE FRACTURE SURGERY Right   . CYSTOSCOPY     [place]  . KNEE ARTHROSCOPY WITH MEDIAL MENISECTOMY Left 01/02/2017   Procedure: LEFT KNEE ARTHROSCOPY WITH PARTIAL MEDIAL MENISCECTOMY;  Surgeon: Mcarthur Rossetti, MD;  Location: [facility];  Service: Orthopedics;  Laterality: Left;  . TONSILLECTOMY    . [place] RESECTION  2007  . UVULOPALATOPHARYNGOPLASTY     Social History   Occupational History  . Occupation: Dance movement psychotherapist  Tobacco Use  . Smoking status: Former Smoker    Packs/day: 0.10    Years: 15.00    Pack years: 1.50  . Smokeless tobacco: Never Used  . Tobacco comment: significant second-hand exposure through mother  Substance and Sexual Activity  . Alcohol use: Yes    Alcohol/week: 1.0 standard drinks    Types: 1 Glasses of wine per week    Comment:  2 x a year  . Drug use: No  . Sexual activity: Never

## 2018-04-02 ENCOUNTER — Ambulatory Visit: Payer: BLUE CROSS/BLUE SHIELD | Admitting: Adult Health

## 2018-04-04 ENCOUNTER — Encounter: Payer: Self-pay | Admitting: Internal Medicine

## 2018-04-05 DIAGNOSIS — N401 Enlarged prostate with lower urinary tract symptoms: Secondary | ICD-10-CM | POA: Diagnosis not present

## 2018-04-05 DIAGNOSIS — N4883 Acquired buried penis: Secondary | ICD-10-CM | POA: Diagnosis not present

## 2018-04-05 DIAGNOSIS — E291 Testicular hypofunction: Secondary | ICD-10-CM | POA: Diagnosis not present

## 2018-04-05 DIAGNOSIS — N3941 Urge incontinence: Secondary | ICD-10-CM | POA: Diagnosis not present

## 2018-04-05 DIAGNOSIS — N3944 Nocturnal enuresis: Secondary | ICD-10-CM | POA: Diagnosis not present

## 2018-04-06 ENCOUNTER — Other Ambulatory Visit (INDEPENDENT_AMBULATORY_CARE_PROVIDER_SITE_OTHER): Payer: Self-pay

## 2018-04-06 ENCOUNTER — Encounter (INDEPENDENT_AMBULATORY_CARE_PROVIDER_SITE_OTHER): Payer: Self-pay

## 2018-04-06 DIAGNOSIS — M4807 Spinal stenosis, lumbosacral region: Secondary | ICD-10-CM

## 2018-04-06 NOTE — Progress Notes (Signed)
FOLLOW UP  Assessment and Plan:   Asthma/COPD overlap syndrome/ + ? Ongoing exacerbation complete antibiotics, cough with thick secretions mostly improved Continue inhalers, guaifenasin and follow up with pulmonology  Bilateral lower extremity edema Currently off of lasix due to instablity, edema has resumed Discussed extremity elevation, encouraged continued weight loss efforts (weight is stable/down despite edema), patient cannot tolerate compression hose Will get repeat ECHO at next visit with cardiology per patient Continue to monitor closely, encouraged resumption of lasix  Hypertension Well controlled with current medications prior to recent illness, fair control by manual recheck - will postpone change in medication at this time Monitor blood pressure at home; patient to call if consistently greater than 130/80 Continue DASH diet.   Reminder to go to the ER if any CP, SOB, nausea, dizziness, severe HA, changes vision/speech, left arm numbness and tingling and jaw pain.  Cholesterol Currently at LDL goal; diet for triglycerides discussed Continue low cholesterol diet and exercise.  Check lipid panel.   Prediabetes Continue diet and exercise.  Perform daily foot/skin check, notify office of any concerning changes.  Check A1C  Morbid Obesity with co morbidities Long discussion about weight loss, diet, and exercise Recommended diet heavy in fruits and veggies and low in animal meats, cheeses, and dairy products, appropriate calorie intake Discussed ideal weight for height Resume dietary efforts, start walking daily once recovered from URI and back pain Will follow up in 3 months  GERD Well managed on current medications; hasn't tolerated taper off of PPI - likely secondary to weight, weight loss recommended strongly Discussed diet, avoiding triggers and other lifestyle changes  Vitamin D Def Below goal at last visit despite very high dose; continue supplementation to  maintain goal of 70-100 Check Vit D level  Polypharmacy Discussed needs to get medications from one pharmacy Review medications and discontinue as appropriate - patient was very helpful and several unessential medications were d/c'd today Encouraged to discuss with various specialists and request d/c'ing non-essential medications  Continue diet and meds as discussed. Further disposition pending results of labs. Discussed med's effects and SE's.   Over 30 minutes of exam, counseling, chart review, and critical decision making was performed.   Future Appointments  Date Time Provider [place]  04/11/2018  1:30 PM [facility]-315 MR 3 [facility]-315MRI [facility]  04/12/2018  2:30 PM Parrett, Fonnie Mu, NP [facility] None  04/17/2018  4:50 PM GI-315 MR 3 GI-315MRI GI-[facility]  04/19/2018  3:30 PM Pete Pelt, PA-C [facility] None  04/28/2018  3:30 PM Marshell Garfinkel, MD [facility] None  05/11/2018  3:45 PM [name], MD [name]  06/03/2018  2:45 PM [name], DPM [name]  06/14/2018  3:30 PM [name], MD [name]  12/29/2018  3:00 PM [name], MD [name]    ----------------------------------------------------------------------------------------------------------------------  HPI 57 y.o. male  presents for 3 month follow up on hypertension, cholesterol, prediabetes, morbid obesity, asthma/COPD on O2, chronic venous insufficiency, and vitamin D deficiency.  He additionally has OSA w/mask intolerance  He is also following up from recent COPD/asthma overlap exacerbation; he is finishing up a course of avalox.  He reports ongoing mildly productive cough. He demonstrates rare junky cough while in office today, in not apparent acute distress, able to speak in complete sentences without notable distress. He is previously established with pulmonology for COPD/asthma overlap syndrome treated by albuterol, symbicort, singulaire, spiriva, in the  process of getting portable oxygen; on2 L PRN at  home.   He reports multiple falls over the last few weeks, is being evaluated by orthopedist, pending lumbar and cervical MRI due to ongoing intermittent lumbar pain, intermittent numbness of bilateral upper extremities. He reports intermittent unsteadiness on his feet "they just go out from under me." He is established with neurology Dr. Laurena Slimmer.   BMI is Body mass index is 53.98 kg/m. , recent ECHO from 02/2017 reviewed, LV EF 60-65%) he has  been working on diet, exercise is limited. Prior to being ill recently he has been trying to avoid sugars, carbs, fried foods; dependent on uncle and doing a lot of takeout.  Wt Readings from Last 3 Encounters:  04/07/18 (!) 398 lb (180.5 kg)  03/29/18 (!) 399 lb 14.6 oz (181.4 kg)  03/25/18 (!) 400 lb (181.4 kg)   His blood pressure has been controlled at home (typically runs 130s/70s or lower), today their BP is BP: (!) 144/94, he admits he is off of his lasix due to recent instability  He does not workout. He denies chest pain, dizziness - he does have dyspnea with activity.    He is on cholesterol medication (pravastatin 20 mg daily) and denies myalgias. His LDL cholesterol is at goal. The cholesterol last visit was:   Lab Results  Component Value Date   CHOL 114 11/25/2017   HDL 31 (L) 11/25/2017   LDLCALC 51 11/25/2017   TRIG 275 (H) 11/25/2017   CHOLHDL 3.7 11/25/2017    He has not been working on diet and exercise for prediabetes, and denies foot ulcerations, increased appetite, nausea, polydipsia, polyuria, visual disturbances and vomiting. Last A1C in the office was:  Lab Results  Component Value Date   HGBA1C 5.9 (H) 11/25/2017   Patient is on Vitamin D supplement and near goal at recent check, taking 40000 daily:    Lab Results  Component Value Date   VD25OH 53 11/25/2017       Current Medications:  Current Outpatient Medications on File Prior to Visit  Medication Sig  .  albuterol (PROVENTIL HFA;VENTOLIN HFA) 108 (90 Base) MCG/ACT inhaler 1 to 2 inhalations 10 to 15 minutes apart every 4 hrs to rescue Asthma  . albuterol (PROVENTIL) (2.5 MG/3ML) 0.083% nebulizer solution Inhale 3 mLs (2.5 mg total) into the lungs every 4 (four) hours as needed.  [name] alfuzosin (UROXATRAL) 10 MG 24 hr tablet Take 10 mg by mouth daily.    [name] anastrozole (ARIMIDEX) 1 MG tablet daily.  . baclofen (LIORESAL) 10 MG tablet Take 10 mg by mouth 3 (three) times daily as needed for muscle spasms.   . bisoprolol-hydrochlorothiazide (ZIAC) 5-6.25 MG tablet TAKE 1 TABLET BY MOUTH EVERY DAY  . Botulinum Toxin Type A 200 units SOLR Inject into the skin.  . Calcium Carbonate-Vitamin D (CALCIUM + D PO) Take 500 mg by mouth 3 (three) times daily.   . cetaphil (CETAPHIL) lotion Apply topically 2 (two) times daily.  . cetirizine (ZYRTEC) 10 MG tablet Take 10 mg by mouth daily.  . Cholecalciferol (VITAMIN D PO) Take 10,000-20,000 Units by mouth 2 (two) times daily. 20000 in the morning and 10000 in the evening  . Cinnamon 500 MG capsule Take by mouth.  . clotrimazole-betamethasone (LOTRISONE) cream APPLY TO AFFECTED AREA TWICE A DAY  . co-enzyme Q-10 30 MG capsule Take 30 mg by mouth daily.  . cyclobenzaprine (FLEXERIL) 10 MG tablet Take 10 mg by mouth 3 (three) times daily.   . diclofenac (VOLTAREN) 75 MG EC tablet Take  1 tablet (75 mg total) by mouth 2 (two) times daily.  . diclofenac sodium (VOLTAREN) 1 % GEL APPLY 4 GRAMS TOPICALLY 4 TIMES DAILY  . donepezil (ARICEPT) 23 MG TABS tablet Take 23 mg by mouth at bedtime.  . Dupilumab, Asthma, (DUPIXENT) 200 MG/1.14ML SOSY Inject into the skin. Injects bi-weekly  . DYMISTA 137-50 MCG/ACT SUSP PLACE 2 SPRAYS INTO BOTH NOSTRILS 2 (TWO) TIMES DAILY.  [name] Eluxadoline (VIBERZI) 100 MG TABS Take 1 tablet by mouth 2 (two) times daily.   [name] EPINEPHrine 0.3 mg/0.3 mL IJ SOAJ injection Inject as directed.  . Fluticasone-Umeclidin-Vilant (TRELEGY ELLIPTA) 100-62.5-25  MCG/INH AEPB Inhale 1 puff into the lungs daily.  . furosemide (LASIX) 80 MG tablet Take 1 to 1 & 1/2 tablets 2 x /day for Fluid Retention & Swelling of Legs  . gabapentin (NEURONTIN) 800 MG tablet Take 1 tablet (800 mg total) by mouth 3 (three) times daily.  [name] Galcanezumab-gnlm (EMGALITY) 120 MG/ML SOAJ Inject into the skin. Pt receives this monthly ingection  . GLUCOSAMINE-CHONDROITIN PO Take 3 tablets by mouth daily.  [name] KRILL OIL PO Take by mouth.  . lidocaine (LIDODERM) 5 % Place 1 patch onto the skin daily. Remove & Discard patch within 12 hours or as directed by MD  . LUTEIN PO Take 1 tablet by mouth 2 (two) times daily.  . memantine (NAMENDA) 10 MG tablet Take 10 mg by mouth 2 (two) times daily.   . metFORMIN (GLUCOPHAGE-XR) 500 MG 24 hr tablet Take 2 tablets 2 x /day with Breakfast & Supper  for Diabetes  . metolazone (ZAROXOLYN) 5 MG tablet Take 1 tablet daily for Fluid Retention  . montelukast (SINGULAIR) 10 MG tablet TAKE 1 TABLET BY MOUTH EVERY DAY  . moxifloxacin (AVELOX) 400 MG tablet Take 1 tablet daily with food for Infection (take Imodium / Loperamide if get Diarrhea)  . Multiple Vitamins-Minerals (MULTIVITAMIN WITH MINERALS) tablet Take 1 tablet by mouth daily.  . naloxone (NARCAN) nasal spray 4 mg/0.1 mL USE AS DIRECTED  . ondansetron (ZOFRAN-ODT) 4 MG disintegrating tablet TAKE 1 TABLET (4 MG TOTAL) BY MOUTH EVERY 6 (SIX) HOURS AS NEEDED FOR NAUSEA OR VOMITING.  [name] oxymetazoline (AFRIN) 0.05 % nasal spray Place 1 spray into both nostrils See admin instructions. 1 SPRAY PER SIDE EACH NIGHT BEFORE DYMISTA  . pentosan polysulfate (ELMIRON) 100 MG capsule Take 100 mg by mouth 3 (three) times daily before meals.   . potassium chloride SA (K-DUR,KLOR-CON) 20 MEQ tablet Take 1 tablet 2 x /day  For potassium  . pravastatin (PRAVACHOL) 40 MG tablet TAKE 1 TABLET BY MOUTH EVERYDAY AT BEDTIME  . PRESCRIPTION MEDICATION Pt receives weekly allergy shots  . PROBIOTIC PRODUCT PO Take by  mouth.  . ranitidine (ZANTAC) 150 MG capsule Take 1 capsule (150 mg total) by mouth 2 (two) times daily.  . sodium chloride (OCEAN) 0.65 % SOLN nasal spray Place 1 spray into both nostrils 4 (four) times daily as needed for congestion. Uses each time before other nasal sprays  . tadalafil (CIALIS) 5 MG tablet Take 5 mg by mouth every evening.   . triamcinolone cream (KENALOG) 0.1 % APPLY TOPICALLY 4 TIMES A DAY (Patient taking differently: APPLY TOPICALLY 2 TIMES A DAY AS NEEDED for itching)  . trospium (SANCTURA) 20 MG tablet Take 20 mg by mouth 2 (two) times daily.   . TURMERIC PO Take 3 capsules by mouth daily.  . benzonatate (TESSALON) 200 MG capsule Take 1 perle 3 x / day  to prevent cough (Patient not taking: Reported on 04/07/2018)  . desmopressin (DDAVP) 0.2 MG tablet Take 200 mcg by mouth daily.   [name] doxycycline (VIBRAMYCIN) 100 MG capsule Take 100 mg by mouth 2 (two) times daily.  [name] guaiFENesin (MUCINEX) 600 MG 12 hr tablet Take 600 mg by mouth 2 (two) times daily as needed for cough or to loosen phlegm. Instructed to take 3 tablets in the morning  . Methylfol-Algae-B12-Acetylcyst (CEREFOLIN NAC) 6-90.314-2-600 MG TABS Take 1 tablet by mouth daily.   . Phosphatidylserine-DHA-EPA (VAYACOG PO) Take 1 capsule by mouth 2 (two) times daily.  . promethazine-dextromethorphan (PROMETHAZINE-DM) 6.25-15 MG/5ML syrup Take 1 to 2 tsp enery 4 hours if needed for cough  . sulfamethoxazole-trimethoprim (BACTRIM DS,SEPTRA DS) 800-160 MG tablet Take 1 tablet 2 x /day on a full stomach for infection   Current Facility-Administered Medications on File Prior to Visit  Medication  . Mepolizumab SOLR 100 mg     Allergies:  Allergies  Allergen Reactions  . Bee Venom Swelling  . Ppd [Tuberculin Purified Protein Derivative] Other (See Comments)    +ppd NEG Quantferron Gold 3/13  . Fenofibrate Other (See Comments)    Back pain  . Levofloxacin Diarrhea  . Other Other (See Comments)    Some antibiotics  cause diarrhea  . Verapamil Other (See Comments)    Back pain  . Claritin [Loratadine] Other (See Comments)    unknown  . Duloxetine     Brought on asthma     Medical History:  Past Medical History:  Diagnosis Date  . Anesthesia complication requiring reversal agent administration    ? from central apnea, very difficult to get off vent  . Anxiety   . Arthritis    osteo  . Asthma   . BPH (benign prostatic hyperplasia)   . Complication of anesthesia    difficulty waking , they twlight me because of my respiratory problems "  . Dyspnea   . Enlarged heart   . Family history of adverse reaction to anesthesia    mother trouble waking up, and heart stopped  . GERD (gastroesophageal reflux disease)   . Headache    botox injections for headaches  . Hyperlipidemia   . Hypertension   . Hypogonadism male   . IBS (irritable bowel syndrome)   . Obesity   . OSA (obstructive sleep apnea)    cpap  . Pneumonia   . Pre-diabetes   . Prostatitis    Family history- Reviewed and unchanged Social history- Reviewed and unchanged   Review of Systems:  Review of Systems  Constitutional: Negative for chills, fever, malaise/fatigue and weight loss.  HENT: Negative for hearing loss and tinnitus.   Eyes: Negative for blurred vision and double vision.  Respiratory: Positive for cough and sputum production (thick,scant). Negative for hemoptysis, shortness of breath and wheezing.   Cardiovascular: Negative for chest pain, palpitations, orthopnea, claudication and leg swelling.  Gastrointestinal: Negative for abdominal pain, blood in stool, constipation, diarrhea, heartburn, melena, nausea and vomiting.  Genitourinary: Negative.   Musculoskeletal: Positive for back pain and falls. Negative for joint pain and myalgias.  Skin: Negative for rash.  Neurological: Positive for tingling (bilateral feet and upper extremities). Negative for dizziness, sensory change, weakness and headaches.   Endo/Heme/Allergies: Negative for polydipsia.  Psychiatric/Behavioral: Negative.   All other systems reviewed and are negative.   Physical Exam: BP (!) 144/94   Pulse (!) 102   Temp 97.7 F (36.5 C)   Wt (!) 398 lb (  180.5 kg)   SpO2 90%   BMI 53.98 kg/m  Wt Readings from Last 3 Encounters:  04/07/18 (!) 398 lb (180.5 kg)  03/29/18 (!) 399 lb 14.6 oz (181.4 kg)  03/25/18 (!) 400 lb (181.4 kg)   General Appearance: Well nourished, obese, in no acute distress. Eyes: PERRLA, EOMs, conjunctiva no swelling or erythema Sinuses: No Frontal/maxillary tenderness ENT/Mouth: Ext aud canals clear, TMs without erythema, bulging. No erythema, swelling, or exudate on post pharynx.  Tonsils not swollen or erythematous. Hearing normal.  Neck: Supple, thyroid normal.  Respiratory: Respiratory effort normal, BS equal bilaterally with scattered rales to bases, rhonchi over bronchial areas bilaterally without wheezing or stridor.  Cardio: Heart sounds distant secondary to body habitus -  no audible MRGs. Intact peripheral pulses with 2+ edema through ankles/lower legs.   Abdomen: Soft, morbidly obese, + BS.  Non tender, no guarding, rebound, hernias, masses. Lymphatics: Non tender without lymphadenopathy.  Musculoskeletal: Limited exam, back pain, symmetrical strength, Normal gait with cane Skin: Warm, dry without rashes, lesions, ecchymosis.  Neuro: Cranial nerves intact. No cerebellar symptoms.  Psych: Awake and oriented X 3, normal affect, Insight and Judgment appropriate.    Izora Ribas, NP 4:55 PM [facility]

## 2018-04-07 ENCOUNTER — Encounter: Payer: Self-pay | Admitting: Adult Health

## 2018-04-07 ENCOUNTER — Ambulatory Visit: Payer: BLUE CROSS/BLUE SHIELD | Admitting: Adult Health

## 2018-04-07 VITALS — BP 144/94 | HR 102 | Temp 97.7°F | Wt 398.0 lb

## 2018-04-07 DIAGNOSIS — R7303 Prediabetes: Secondary | ICD-10-CM | POA: Diagnosis not present

## 2018-04-07 DIAGNOSIS — I10 Essential (primary) hypertension: Secondary | ICD-10-CM | POA: Diagnosis not present

## 2018-04-07 DIAGNOSIS — E782 Mixed hyperlipidemia: Secondary | ICD-10-CM | POA: Diagnosis not present

## 2018-04-07 DIAGNOSIS — J449 Chronic obstructive pulmonary disease, unspecified: Secondary | ICD-10-CM

## 2018-04-07 DIAGNOSIS — Z79899 Other long term (current) drug therapy: Secondary | ICD-10-CM

## 2018-04-07 DIAGNOSIS — E559 Vitamin D deficiency, unspecified: Secondary | ICD-10-CM | POA: Diagnosis not present

## 2018-04-07 DIAGNOSIS — R600 Localized edema: Secondary | ICD-10-CM

## 2018-04-07 DIAGNOSIS — K219 Gastro-esophageal reflux disease without esophagitis: Secondary | ICD-10-CM | POA: Diagnosis not present

## 2018-04-07 DIAGNOSIS — F3341 Major depressive disorder, recurrent, in partial remission: Secondary | ICD-10-CM

## 2018-04-07 DIAGNOSIS — Z6841 Body Mass Index (BMI) 40.0 and over, adult: Secondary | ICD-10-CM

## 2018-04-07 DIAGNOSIS — J4489 Other specified chronic obstructive pulmonary disease: Secondary | ICD-10-CM

## 2018-04-07 MED ORDER — LYRICA 150 MG PO CAPS: 300.0000 mg | ORAL_CAPSULE | Freq: Two times a day (BID) | ORAL | 2 refills | Status: DC

## 2018-04-07 NOTE — Patient Instructions (Signed)
Edema Edema is an abnormal buildup of fluids in your bodytissues. Edema is somewhatdependent on gravity to pull the fluid to the lowest place in your body. That makes the condition more common in the legs and thighs (lower extremities). Painless swelling of the feet and ankles is common and becomes more likely as you get older. It is also common in looser tissues, like around your eyes. When the affected area is squeezed, the fluid may move out of that spot and leave a dent for a few moments. This dent is called pitting. What are the causes? There are many possible causes of edema. Eating too much salt and being on your feet or sitting for a long time can cause edema in your legs and ankles. Hot weather may make edema worse. Common medical causes of edema include:  Heart failure.  Liver disease.  Kidney disease.  Weak blood vessels in your legs.  Cancer.  An injury.  Pregnancy.  Some medications.  Obesity.  What are the signs or symptoms? Edema is usually painless.Your skin may look swollen or shiny. How is this diagnosed? Your health care provider may be able to diagnose edema by asking about your medical history and doing a physical exam. You may need to have tests such as X-rays, an electrocardiogram, or blood tests to check for medical conditions that may cause edema. How is this treated? Edema treatment depends on the cause. If you have heart, liver, or kidney disease, you need the treatment appropriate for these conditions. General treatment may include:  Elevation of the affected body part above the level of your heart.  Compression of the affected body part. Pressure from elastic bandages or support stockings squeezes the tissues and forces fluid back into the blood vessels. This keeps fluid from entering the tissues.  Restriction of fluid and salt intake.  Use of a water pill (diuretic). These medications are appropriate only for some types of edema. They pull fluid  out of your body and make you urinate more often. This gets rid of fluid and reduces swelling, but diuretics can have side effects. Only use diuretics as directed by your health care provider.  Follow these instructions at home:  Keep the affected body part above the level of your heart when you are lying down.  Do not sit still or stand for prolonged periods.  Do not put anything directly under your knees when lying down.  Do not wear constricting clothing or garters on your upper legs.  Exercise your legs to work the fluid back into your blood vessels. This may help the swelling go down.  Wear elastic bandages or support stockings to reduce ankle swelling as directed by your health care provider.  Eat a low-salt diet to reduce fluid if your health care provider recommends it.  Only take medicines as directed by your health care provider. Contact a health care provider if:  Your edema is not responding to treatment.  You have heart, liver, or kidney disease and notice symptoms of edema.  You have edema in your legs that does not improve after elevating them.  You have sudden and unexplained weight gain. Get help right away if:  You develop shortness of breath or chest pain.  You cannot breathe when you lie down.  You develop pain, redness, or warmth in the swollen areas.  You have heart, liver, or kidney disease and suddenly get edema.  You have a fever and your symptoms suddenly get worse. This information is   not intended to replace advice given to you by your health care provider. Make sure you discuss any questions you have with your health care provider. Document Released: 05/12/2005 Document Revised: 10/18/2015 Document Reviewed: 03/04/2013 Elsevier Interactive Patient Education  2017 [employer]    Pregabalin capsules What is this medicine? PREGABALIN (pre GAB a lin) is used to treat nerve pain from diabetes, shingles, spinal cord injury, and fibromyalgia. It  is also used to control seizures in epilepsy. This medicine may be used for other purposes; ask your health care provider or pharmacist if you have questions. COMMON BRAND NAME(S): Lyrica What should I tell my health care provider before I take this medicine? They need to know if you have any of these conditions: -bleeding problems -heart disease, including heart failure -history of alcohol or drug abuse -kidney disease -suicidal thoughts, plans, or attempt; a previous suicide attempt by you or a family member -an unusual or allergic reaction to pregabalin, gabapentin, other medicines, foods, dyes, or preservatives -pregnant or trying to get pregnant or trying to conceive with your partner -breast-feeding How should I use this medicine? Take this medicine by mouth with a glass of water. Follow the directions on the prescription label. You can take this medicine with or without food. Take your doses at regular intervals. Do not take your medicine more often than directed. Do not stop taking except on your doctor's advice. A special MedGuide will be given to you by the pharmacist with each prescription and refill. Be sure to read this information carefully each time. Talk to your pediatrician regarding the use of this medicine in children. Special care may be needed. Overdosage: If you think you have taken too much of this medicine contact a poison control center or emergency room at once. NOTE: This medicine is only for you. Do not share this medicine with others. What if I miss a dose? If you miss a dose, take it as soon as you can. If it is almost time for your next dose, take only that dose. Do not take double or extra doses. What may interact with this medicine? -alcohol -certain medicines for blood pressure like captopril, enalapril, or lisinopril -certain medicines for diabetes, like pioglitazone or rosiglitazone -certain medicines for anxiety or sleep -narcotic medicines for  pain This list may not describe all possible interactions. Give your health care provider a list of all the medicines, herbs, non-prescription drugs, or dietary supplements you use. Also tell them if you smoke, drink alcohol, or use illegal drugs. Some items may interact with your medicine. What should I watch for while using this medicine? Tell your doctor or healthcare professional if your symptoms do not start to get better or if they get worse. Visit your doctor or health care professional for regular checks on your progress. Do not stop taking except on your doctor's advice. You may develop a severe reaction. Your doctor will tell you how much medicine to take. Wear a medical identification bracelet or chain if you are taking this medicine for seizures, and carry a card that describes your disease and details of your medicine and dosage times. You may get drowsy or dizzy. Do not drive, use machinery, or do anything that needs mental alertness until you know how this medicine affects you. Do not stand or sit up quickly, especially if you are an older patient. This reduces the risk of dizzy or fainting spells. Alcohol may interfere with the effect of this medicine. Avoid alcoholic drinks. If  you have a heart condition, like congestive heart failure, and notice that you are retaining water and have swelling in your hands or feet, contact your health care provider immediately. The use of this medicine may increase the chance of suicidal thoughts or actions. Pay special attention to how you are responding while on this medicine. Any worsening of mood, or thoughts of suicide or dying should be reported to your health care professional right away. This medicine has caused reduced sperm counts in some men. This may interfere with the ability to father a child. You should talk to your doctor or health care professional if you are concerned about your fertility. Women who become pregnant while using this  medicine for seizures may enroll in the [place] Pregnancy Registry by calling [phone]. This registry collects information about the safety of antiepileptic drug use during pregnancy. What side effects may I notice from receiving this medicine? Side effects that you should report to your doctor or health care professional as soon as possible: -allergic reactions like skin rash, itching or hives, swelling of the face, lips, or tongue -breathing problems -changes in vision -chest pain -confusion -jerking or unusual movements of any part of your body -loss of memory -muscle pain, tenderness, or weakness -suicidal thoughts or other mood changes -swelling of the ankles, feet, hands -unusual bruising or bleeding Side effects that usually do not require medical attention (report to your doctor or health care professional if they continue or are bothersome): -dizziness -drowsiness -dry mouth -headache -nausea -tremors -trouble sleeping -weight gain This list may not describe all possible side effects. Call your doctor for medical advice about side effects. You may report side effects to FDA at 1-800-FDA-1088. Where should I keep my medicine? Keep out of the reach of children. This medicine can be abused. Keep your medicine in a safe place to protect it from theft. Do not share this medicine with anyone. Selling or giving away this medicine is dangerous and against the law. This medicine may cause accidental overdose and death if it taken by other adults, children, or pets. Mix any unused medicine with a substance like cat litter or coffee grounds. Then throw the medicine away in a sealed container like a sealed bag or a coffee can with a lid. Do not use the medicine after the expiration date. Store at room temperature between 15 and 30 degrees C (59 and 86 degrees F). NOTE: This sheet is a summary. It may not cover all possible information. If you have questions  about this medicine, talk to your doctor, pharmacist, or health care provider.  2018 Elsevier/Gold Standard (2015-06-14 10:26:12)

## 2018-04-08 LAB — CBC WITH DIFFERENTIAL/PLATELET
BASOS PCT: 1.1 %
Basophils Absolute: 79 cells/uL (ref 0–200)
EOS PCT: 1.9 %
Eosinophils Absolute: 137 cells/uL (ref 15–500)
HCT: 48 % (ref 38.5–50.0)
HEMOGLOBIN: 16.4 g/dL (ref 13.2–17.1)
LYMPHS ABS: 2462 {cells}/uL (ref 850–3900)
MCH: 30.2 pg (ref 27.0–33.0)
MCHC: 34.2 g/dL (ref 32.0–36.0)
MCV: 88.4 fL (ref 80.0–100.0)
MONOS PCT: 11.9 %
MPV: 10.5 fL (ref 7.5–12.5)
Neutro Abs: 3665 cells/uL (ref 1500–7800)
Neutrophils Relative %: 50.9 %
PLATELETS: 270 10*3/uL (ref 140–400)
RBC: 5.43 10*6/uL (ref 4.20–5.80)
RDW: 13.6 % (ref 11.0–15.0)
TOTAL LYMPHOCYTE: 34.2 %
WBC: 7.2 10*3/uL (ref 3.8–10.8)
WBCMIX: 857 {cells}/uL (ref 200–950)

## 2018-04-08 LAB — LIPID PANEL
CHOLESTEROL: 131 mg/dL (ref <200)
HDL: 35 mg/dL — ABNORMAL LOW (ref >40)
LDL Cholesterol (Calc): 70 mg/dL (calc)
NON-HDL CHOLESTEROL (CALC): 96 mg/dL (ref <130)
TRIGLYCERIDES: 181 mg/dL — AB (ref <150)
Total CHOL/HDL Ratio: 3.7 (calc) (ref <5.0)

## 2018-04-08 LAB — TSH: TSH: 0.94 m[IU]/L (ref 0.40–4.50)

## 2018-04-08 LAB — COMPLETE METABOLIC PANEL WITH GFR
AG RATIO: 1.8 (calc) (ref 1.0–2.5)
ALKALINE PHOSPHATASE (APISO): 59 U/L (ref 40–115)
ALT: 28 U/L (ref 9–46)
AST: 22 U/L (ref 10–35)
Albumin: 4.1 g/dL (ref 3.6–5.1)
BILIRUBIN TOTAL: 1.5 mg/dL — AB (ref 0.2–1.2)
BUN: 12 mg/dL (ref 7–25)
CHLORIDE: 104 mmol/L (ref 98–110)
CO2: 34 mmol/L — ABNORMAL HIGH (ref 20–32)
Calcium: 9.4 mg/dL (ref 8.6–10.3)
Creat: 0.83 mg/dL (ref 0.70–1.33)
GFR, Est African American: 113 mL/min/{1.73_m2} (ref >=60)
GFR, Est Non African American: 98 mL/min/{1.73_m2} (ref >=60)
GLOBULIN: 2.3 g/dL (ref 1.9–3.7)
Glucose, Bld: 80 mg/dL (ref 65–99)
POTASSIUM: 4.4 mmol/L (ref 3.5–5.3)
SODIUM: 145 mmol/L (ref 135–146)
Total Protein: 6.4 g/dL (ref 6.1–8.1)

## 2018-04-08 LAB — MAGNESIUM: MAGNESIUM: 1.7 mg/dL (ref 1.5–2.5)

## 2018-04-08 LAB — HEMOGLOBIN A1C
EAG (MMOL/L): 7.7 (calc)
Hgb A1c MFr Bld: 6.5 % of total Hgb — ABNORMAL HIGH (ref <5.7)
Mean Plasma Glucose: 140 (calc)

## 2018-04-08 LAB — VITAMIN D 25 HYDROXY (VIT D DEFICIENCY, FRACTURES): Vit D, 25-Hydroxy: 43 ng/mL (ref 30–100)

## 2018-04-11 ENCOUNTER — Ambulatory Visit
Admission: RE | Admit: 2018-04-11 | Discharge: 2018-04-11 | Disposition: A | Discharge status: Home / Self Care | Payer: BLUE CROSS/BLUE SHIELD | Source: Ambulatory Visit | Attending: Physician Assistant | Admitting: Physician Assistant

## 2018-04-11 DIAGNOSIS — M545 Low back pain, unspecified: Secondary | ICD-10-CM

## 2018-04-12 ENCOUNTER — Ambulatory Visit (INDEPENDENT_AMBULATORY_CARE_PROVIDER_SITE_OTHER): Payer: BLUE CROSS/BLUE SHIELD | Admitting: Adult Health

## 2018-04-12 ENCOUNTER — Encounter: Payer: Self-pay | Admitting: Adult Health

## 2018-04-12 VITALS — BP 118/68 | HR 102 | Ht 72.0 in | Wt >= 6400 oz

## 2018-04-12 DIAGNOSIS — J9611 Chronic respiratory failure with hypoxia: Secondary | ICD-10-CM | POA: Diagnosis not present

## 2018-04-12 DIAGNOSIS — J3089 Other allergic rhinitis: Secondary | ICD-10-CM | POA: Diagnosis not present

## 2018-04-12 DIAGNOSIS — Z6841 Body Mass Index (BMI) 40.0 and over, adult: Secondary | ICD-10-CM

## 2018-04-12 DIAGNOSIS — J9621 Acute and chronic respiratory failure with hypoxia: Secondary | ICD-10-CM | POA: Insufficient documentation

## 2018-04-12 DIAGNOSIS — J449 Chronic obstructive pulmonary disease, unspecified: Secondary | ICD-10-CM

## 2018-04-12 NOTE — Addendum Note (Signed)
Addended by: Parke Poisson E on: 04/12/2018 04:06 PM   Modules accepted: Orders

## 2018-04-12 NOTE — Assessment & Plan Note (Signed)
Cont on current regimen  

## 2018-04-12 NOTE — Patient Instructions (Addendum)
Sputum culture .  Mucinex  Twice daily  As needed  Cough /congestion .  Delsym 2 tsp Twice daily  As needed  Cough .  Tessalon Three times a day  As needed  Cough .  Continue on Oxygen 2l/m with activity .  POC ordered.  Restart CPAP  At bedtime   Try nasal mask . Try snore strip At bedtime   Try to wear for at least 4hr each night.  Work on weight loss.  Do not drive if sleepy .  Continue on nasal sprays.  Follow up Dr. [name] in 6-8 weeks and As needed   Please contact office for sooner follow up if symptoms do not improve or worsen or seek emergency care

## 2018-04-12 NOTE — Assessment & Plan Note (Signed)
Wt loss  

## 2018-04-12 NOTE — Assessment & Plan Note (Signed)
Cont on O2 with act  Order for POC

## 2018-04-12 NOTE — Progress Notes (Signed)
@Patient  ID: Mark Harrell, male    DOB: [birth year], 57 y.o.   MRN: [id number]  Chief Complaint  Patient presents with  . Follow-up    COPD     Referring provider: [name], MD  HPI: 57 year old male never smoker followed for asthma, COPD, obstructive sleep apnea and oxygen dependent respiratory failure Medical history significant for chronic sinusitis followed by ENT, Dr. Lucia Gaskins, previous sinus surgery - with turbinate reduction, uvuloplasty and tonsillectomy .  Memory  Issues - on Aricept and Namenda.   TEST/EVENTS :  FENO 12/28/2017-10  PFTs 12/28/2017 FVC 3.41 [65%), FEV1 2.69 [67%], F/F 79, TLC 79%, DLCO 94% Minimal obstruction, mild restriction  03/09/17: Walked 2 laps / Baseline Sat 97% on RA / Nadir Sat 97% on RA (walked at a slow pace with a cane - stopped due to dyspnea) 05/08/16: Walked 321 meters / Baseline Sat 93% on RA / Nadir Sat 93% on RA @ rest  SPLIT NIGHT SLEEP STUDY (05/07/16): Severe obstructive sleep apnea with AHI 65.7 events/hour. No significant central sleep apnea occurred. Severe desaturation during diagnostic portion to 72% on room air. No cardiac abnormalities or periodic limb movements were noted. Optimal CPAP pressure was 18 cm H2O with a large-sized [employer] Full Face Mask Simplus mask. Reading physician recommended AutoPap at 10-18 cm H2O pressure.  CT chest 01/04/2017- no PE, lungs are clear. Chest x-ray 12/08/2017- no active cardiopulmonary disease. I reviewed the images personally.  ESOPHAGRAM/BARIUM SWALLOW 04/15/16 (per radiologist):Mild nonspecific esophageal dysmotility disorder. Episode of flash laryngeal penetration with swallowing. Small sliding type I hiatal hernia. No visible mass, erosion, or ulcerations.  MICROBIOLOGY Sputum Culture 02/16/17: Normal Flora   LABS 04/10/16 Alpha-1 antitrypsin: MM (135)  03/28/16 CBC: 11.8/14.2/43.0/309 Eosinophils: 354 IgG: 583 IgA: 154 IgM: 46 IgE: 92   11/02/13 ANA:  Negative DS DNA Ab: <1 SSA: <1.0 SSB: <1.0  02/24/11 ACE: 35 RF: <10  Previously on Nucala in 2018 with no perceived benefit Dupixent started January 2019 followed by allergy and immunology  Personal/social history: No pets, no known occupational exposures, no hot tubs, no basement, never smoker.  Previously lived in [place], [place], [place], [place] Self employed.   04/12/2018 Follow up : Chronic Asthma/COPD , , OSA , O2 RF  Patient presents for a 48-month follow-up.  Patient has underlying chronic obstructive asthma. He remains on Trelegy daily.  He is on Singulair daily.  He is on Dupixent.  Patient says overall breathing is doing about the same. Was on prednisone 6 weeks ago.  Currently being treated for Bronchitis . Finished Avelox recently . Some better but still has cough , occasionally green in nature . Worse at night.. Cough is mainly non productive .   Followed by Allergist , started on Doxycycline 100mg  daily prophylaxis for recurrent infection. Currently undergoing workup for Immune Deficiency .  CXR done last month showed no acute process.  CT chest 2018 showed clear lungs.   Pt is on multiple medications .   Patient remains on oxygen 2 L.  Patient needs a Psychologist, counselling for DME and insurance purposes.  O2 saturation on room air today 88%.  Walking.  O2 saturation at rest greater than 90%.  On 2 L O2 saturations greater than 90%. 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   Has Severe OSA . Suppose to be on CPAP . But has had 2 episodes of aspiration PNA , that occurred during night with CPAP on . So has not worn for  a while .  Also has sinus issues and cant not breathe thru nose.  We discussed importance of CPAP At bedtime  And potential dangers of hypoxia   Has GERD , on Zantac Twice daily  . Still has some reflux. Discussed GERD diet .  No bloody stools.    Allergies  Allergen Reactions  . Bee Venom Swelling  . Ppd [Tuberculin Purified Protein  Derivative] Other (See Comments)    +ppd NEG Quantferron Gold 3/13  . Fenofibrate Other (See Comments)    Back pain  . Levofloxacin Diarrhea  . Other Other (See Comments)    Some antibiotics cause diarrhea  . Verapamil Other (See Comments)    Back pain  . Claritin [Loratadine] Other (See Comments)    unknown  . Duloxetine     Brought on asthma    Immunization History  Administered Date(s) Administered  . Influenza,inj,Quad PF,6+ Mos 03/04/2016, 03/09/2017  . Influenza-Unspecified 02/24/2015  . PPD Test 08/06/2011  . Pneumococcal Conjugate-13 11/02/2014  . Pneumococcal Polysaccharide-23 04/10/2016  . Pneumococcal-Unspecified 05/26/2004  . Td 05/26/2000  . Tdap 08/01/2013  . Zoster 05/26/2009  . Zoster Recombinat (Shingrix) 11/05/2016    Past Medical History:  Diagnosis Date  . Anesthesia complication requiring reversal agent administration    ? from central apnea, very difficult to get off vent  . Anxiety   . Arthritis    osteo  . Asthma   . BPH (benign prostatic hyperplasia)   . Complication of anesthesia    difficulty waking , they twlight me because of my respiratory problems "  . Dyspnea   . Enlarged heart   . Family history of adverse reaction to anesthesia    mother trouble waking up, and heart stopped  . GERD (gastroesophageal reflux disease)   . Headache    botox injections for headaches  . Hyperlipidemia   . Hypertension   . Hypogonadism male   . IBS (irritable bowel syndrome)   . Obesity   . OSA (obstructive sleep apnea)    cpap  . Pneumonia   . Pre-diabetes   . Prostatitis     Tobacco History: Social History   Tobacco Use  Smoking Status Former Smoker  . Packs/day: 0.10  . Years: 15.00  . Pack years: 1.50  Smokeless Tobacco Never Used  Tobacco Comment   significant second-hand exposure through mother   Counseling given: Not Answered Comment: significant second-hand exposure through mother   Outpatient Medications Prior to Visit    Medication Sig Dispense Refill  . albuterol (PROVENTIL HFA;VENTOLIN HFA) 108 (90 Base) MCG/ACT inhaler 1 to 2 inhalations 10 to 15 minutes apart every 4 hrs to rescue Asthma 60 g 1  . albuterol (PROVENTIL) (2.5 MG/3ML) 0.083% nebulizer solution Inhale 3 mLs (2.5 mg total) into the lungs every 4 (four) hours as needed. 75 mL 12  . alfuzosin (UROXATRAL) 10 MG 24 hr tablet Take 10 mg by mouth daily.      [name] anastrozole (ARIMIDEX) 1 MG tablet daily.    . baclofen (LIORESAL) 10 MG tablet Take 10 mg by mouth 3 (three) times daily as needed for muscle spasms.     . bisoprolol-hydrochlorothiazide (ZIAC) 5-6.25 MG tablet TAKE 1 TABLET BY MOUTH EVERY DAY 90 tablet 1  . Botulinum Toxin Type A 200 units SOLR Inject into the skin.    . Calcium Carbonate-Vitamin D (CALCIUM + D PO) Take 500 mg by mouth 3 (three) times daily.     . cetaphil (CETAPHIL) lotion Apply  topically 2 (two) times daily. 236 mL 0  . cetirizine (ZYRTEC) 10 MG tablet Take 10 mg by mouth daily.    . Cholecalciferol (VITAMIN D PO) Take 10,000-20,000 Units by mouth 2 (two) times daily. 20000 in the morning and 10000 in the evening    . Cinnamon 500 MG capsule Take by mouth.    . clotrimazole-betamethasone (LOTRISONE) cream APPLY TO AFFECTED AREA TWICE A DAY 15 g 2  . co-enzyme Q-10 30 MG capsule Take 30 mg by mouth daily.    . cyclobenzaprine (FLEXERIL) 10 MG tablet Take 10 mg by mouth 3 (three) times daily.     . diclofenac (VOLTAREN) 75 MG EC tablet Take 1 tablet (75 mg total) by mouth 2 (two) times daily. 180 tablet 1  . diclofenac sodium (VOLTAREN) 1 % GEL APPLY 4 GRAMS TOPICALLY 4 TIMES DAILY 100 g 3  . donepezil (ARICEPT) 23 MG TABS tablet Take 23 mg by mouth at bedtime.    [name] doxycycline (VIBRAMYCIN) 100 MG capsule Take 100 mg by mouth 2 (two) times daily.    . Dupilumab, Asthma, (DUPIXENT) 200 MG/1.14ML SOSY Inject into the skin. Injects bi-weekly    . DYMISTA 137-50 MCG/ACT SUSP PLACE 2 SPRAYS INTO BOTH NOSTRILS 2 (TWO) TIMES DAILY.  23 g 5  . Eluxadoline (VIBERZI) 100 MG TABS Take 1 tablet by mouth 2 (two) times daily.     [name] EPINEPHrine 0.3 mg/0.3 mL IJ SOAJ injection Inject as directed.    . Fluticasone-Umeclidin-Vilant (TRELEGY ELLIPTA) 100-62.5-25 MCG/INH AEPB Inhale 1 puff into the lungs daily. 28 each 2  . furosemide (LASIX) 80 MG tablet Take 1 to 1 & 1/2 tablets 2 x /day for Fluid Retention & Swelling of Legs 270 tablet 0  . Galcanezumab-gnlm (EMGALITY) 120 MG/ML SOAJ Inject into the skin. Pt receives this monthly ingection    . GLUCOSAMINE-CHONDROITIN PO Take 3 tablets by mouth daily.    [name] guaiFENesin (MUCINEX) 600 MG 12 hr tablet Take 600 mg by mouth 2 (two) times daily as needed for cough or to loosen phlegm. Instructed to take 3 tablets in the morning    . KRILL OIL PO Take by mouth.    . lidocaine (LIDODERM) 5 % Place 1 patch onto the skin daily. Remove & Discard patch within 12 hours or as directed by MD 14 patch 0  . LUTEIN PO Take 1 tablet by mouth 2 (two) times daily.    [name] LYRICA 150 MG capsule Take 2 capsules (300 mg total) by mouth 2 (two) times daily. 120 capsule 2  . memantine (NAMENDA) 10 MG tablet Take 10 mg by mouth daily.     . metFORMIN (GLUCOPHAGE-XR) 500 MG 24 hr tablet Take 2 tablets 2 x /day with Breakfast & Supper  for Diabetes 360 tablet 3  . metolazone (ZAROXOLYN) 5 MG tablet Take 1 tablet daily for Fluid Retention 90 tablet 1  . montelukast (SINGULAIR) 10 MG tablet TAKE 1 TABLET BY MOUTH EVERY DAY 90 tablet 1  . Multiple Vitamins-Minerals (MULTIVITAMIN WITH MINERALS) tablet Take 1 tablet by mouth daily.    . naloxone (NARCAN) nasal spray 4 mg/0.1 mL USE AS DIRECTED    . ondansetron (ZOFRAN-ODT) 4 MG disintegrating tablet TAKE 1 TABLET (4 MG TOTAL) BY MOUTH EVERY 6 (SIX) HOURS AS NEEDED FOR NAUSEA OR VOMITING. 30 tablet 0  . oxymetazoline (AFRIN) 0.05 % nasal spray Place 1 spray into both nostrils See admin instructions. 1 SPRAY PER SIDE EACH NIGHT BEFORE DYMISTA    .  pentosan polysulfate  (ELMIRON) 100 MG capsule Take 100 mg by mouth 3 (three) times daily before meals.     . potassium chloride SA (K-DUR,KLOR-CON) 20 MEQ tablet Take 1 tablet 2 x /day  For potassium 180 tablet 1  . pravastatin (PRAVACHOL) 40 MG tablet TAKE 1 TABLET BY MOUTH EVERYDAY AT BEDTIME (Patient taking differently: Take 20 mg by mouth daily. TAKE 1 TABLET BY MOUTH EVERYDAY AT BEDTIME) 90 tablet 1  . PRESCRIPTION MEDICATION Pt receives weekly allergy shots    . PROBIOTIC PRODUCT PO Take by mouth.    . ranitidine (ZANTAC) 150 MG capsule Take 1 capsule (150 mg total) by mouth 2 (two) times daily. 90 capsule 2  . sodium chloride (OCEAN) 0.65 % SOLN nasal spray Place 1 spray into both nostrils 4 (four) times daily as needed for congestion. Uses each time before other nasal sprays    . tadalafil (CIALIS) 5 MG tablet Take 5 mg by mouth every evening.     . triamcinolone cream (KENALOG) 0.1 % APPLY TOPICALLY 4 TIMES A DAY (Patient taking differently: APPLY TOPICALLY 2 TIMES A DAY AS NEEDED for itching) 30 g 1  . trospium (SANCTURA) 20 MG tablet Take 20 mg by mouth 2 (two) times daily.   2  . TURMERIC PO Take 3 capsules by mouth daily.    [name] desmopressin (DDAVP) 0.2 MG tablet Take 200 mcg by mouth daily.   3  . moxifloxacin (AVELOX) 400 MG tablet Take 1 tablet daily with food for Infection (take Imodium / Loperamide if get Diarrhea) (Patient not taking: Reported on 04/12/2018) 14 tablet 0   Facility-Administered Medications Prior to Visit  Medication Dose Route Frequency Provider Last Rate Last Dose  . Mepolizumab SOLR 100 mg  100 mg Subcutaneous Q28 days [name], MD   100 mg at 05/11/17 1546     Review of Systems  Constitutional:   No  weight loss, night sweats,  Fevers, chills,  +fatigue, or  lassitude.  HEENT:   No headaches,  Difficulty swallowing,  Tooth/dental problems, or  Sore throat,                No sneezing, itching, ear ache,  +nasal congestion, post nasal drip,   CV:  No chest pain,   Orthopnea, PND, swelling in lower extremities, anasarca, dizziness, palpitations, syncope.   GI  No heartburn, indigestion, abdominal pain, nausea, vomiting, diarrhea, change in bowel habits, loss of appetite, bloody stools.   Resp:   No chest wall deformity  Skin: no rash or lesions.  GU: no dysuria, change in color of urine, no urgency or frequency.  No flank pain, no hematuria   MS:  No joint pain or swelling.  No decreased range of motion.  No back pain.    Physical Exam  There were no vitals taken for this visit.  GEN: A/Ox3; pleasant , NAD, obese    HEENT:  [place]/AT,  EACs-clear, TMs-wnl, NOSE-clear drainage  THROAT-clear, no lesions, no postnasal drip or exudate noted.   NECK:  Supple w/ fair ROM; no JVD; normal carotid impulses w/o bruits; no thyromegaly or nodules palpated; no lymphadenopathy.    RESP  Clear  P & A; w/o, wheezes/ rales/ or rhonchi. no accessory muscle use, no dullness to percussion  CARD:  RRR, no m/r/g, tr  peripheral edema, pulses intact, no cyanosis or clubbing.  GI:   Soft & nt; nml bowel sounds; no organomegaly or masses detected.   Musco: Warm bil,  no deformities or joint swelling noted.   Neuro: alert, no focal deficits noted.    Skin: Warm, no lesions or rashes    Lab Results:  CBC   Imaging:     PFT Results Latest Ref Rng & Units 12/28/2017 04/21/2016  FVC-Pre L 3.58 3.47  FVC-Predicted Pre % 68 66  FVC-Post L 3.41 3.84  FVC-Predicted Post % 65 73  Pre FEV1/FVC % % 75 74  Post FEV1/FCV % % 79 72  FEV1-Pre L 2.70 2.58  FEV1-Predicted Pre % 68 64  FEV1-Post L 2.69 2.78  DLCO UNC% % 94 95  DLCO COR %Predicted % 125 117  TLC L 5.84 6.21  TLC % Predicted % 79 84  RV % Predicted % 100 96    Lab Results  Component Value Date   NITRICOXIDE 10 12/28/2017        Assessment & Plan:   Asthma-COPD overlap syndrome (HCC) Asthma with COPD and never smoker.  Patient has severe allergic component and possible immune  deficiency.  He is currently being followed by asthma and allergy.  He is on Dupixent and Trelegy.  Along with Singulair. Continue follow-up with asthma and allergy.  Sputum culture today.  Patient is recently finished prolonged course of antibiotics and is on daily doxycycline. Chest x-ray last month showed clear lungs.  Plan  Patient Instructions  Sputum culture .  Mucinex  Twice daily  As needed  Cough /congestion .  Delsym 2 tsp Twice daily  As needed  Cough .  Tessalon Three times a day  As needed  Cough .  Continue on Oxygen 2l/m with activity .  POC ordered.  Restart CPAP  At bedtime   Try nasal mask . Try snore strip At bedtime   Try to wear for at least 4hr each night.  Work on weight loss.  Do not drive if sleepy .  Continue on nasal sprays.  Follow up Dr. [name] in 6-8 weeks and As needed   Please contact office for sooner follow up if symptoms do not improve or worsen or seek emergency care     '  Chronic nonseasonal allergic rhinitis due to pollen Cont on current regimen   Morbid obesity with BMI of 50.0-59.9, adult ([place]) Wt loss      Rexene Edison, NP 04/12/2018

## 2018-04-12 NOTE — Assessment & Plan Note (Signed)
Asthma with COPD and never smoker.  Patient has severe allergic component and possible immune deficiency.  He is currently being followed by asthma and allergy.  He is on Dupixent and Trelegy.  Along with Singulair. Continue follow-up with asthma and allergy.  Sputum culture today.  Patient is recently finished prolonged course of antibiotics and is on daily doxycycline. Chest x-ray last month showed clear lungs.  Plan  Patient Instructions  Sputum culture .  Mucinex  Twice daily  As needed  Cough /congestion .  Delsym 2 tsp Twice daily  As needed  Cough .  Tessalon Three times a day  As needed  Cough .  Continue on Oxygen 2l/m with activity .  POC ordered.  Restart CPAP  At bedtime   Try nasal mask . Try snore strip At bedtime   Try to wear for at least 4hr each night.  Work on weight loss.  Do not drive if sleepy .  Continue on nasal sprays.  Follow up Dr. [name] in 6-8 weeks and As needed   Please contact office for sooner follow up if symptoms do not improve or worsen or seek emergency care     '

## 2018-04-14 DIAGNOSIS — R419 Unspecified symptoms and signs involving cognitive functions and awareness: Secondary | ICD-10-CM | POA: Diagnosis not present

## 2018-04-14 DIAGNOSIS — G43709 Chronic migraine without aura, not intractable, without status migrainosus: Secondary | ICD-10-CM | POA: Diagnosis not present

## 2018-04-14 DIAGNOSIS — J449 Chronic obstructive pulmonary disease, unspecified: Secondary | ICD-10-CM | POA: Diagnosis not present

## 2018-04-14 DIAGNOSIS — G4733 Obstructive sleep apnea (adult) (pediatric): Secondary | ICD-10-CM | POA: Diagnosis not present

## 2018-04-14 DIAGNOSIS — F329 Major depressive disorder, single episode, unspecified: Secondary | ICD-10-CM | POA: Diagnosis not present

## 2018-04-15 ENCOUNTER — Other Ambulatory Visit: Payer: BLUE CROSS/BLUE SHIELD

## 2018-04-15 DIAGNOSIS — J449 Chronic obstructive pulmonary disease, unspecified: Secondary | ICD-10-CM | POA: Diagnosis not present

## 2018-04-15 NOTE — Telephone Encounter (Signed)
Pt is requesting Rx for Promethazine DM.  This medication has not been prescribed to pt previously by our office.  Spoke to pt to gather additional information.  Pt reports of intermitted cough that worsen at night & occ wheezing.  Cough is occ prod with green mucus.   TP please advise.

## 2018-04-16 DIAGNOSIS — J9611 Chronic respiratory failure with hypoxia: Secondary | ICD-10-CM | POA: Diagnosis not present

## 2018-04-16 DIAGNOSIS — J449 Chronic obstructive pulmonary disease, unspecified: Secondary | ICD-10-CM | POA: Diagnosis not present

## 2018-04-17 ENCOUNTER — Ambulatory Visit
Admission: RE | Admit: 2018-04-17 | Discharge: 2018-04-17 | Disposition: A | Discharge status: Home / Self Care | Payer: BLUE CROSS/BLUE SHIELD | Source: Ambulatory Visit | Attending: Orthopaedic Surgery | Admitting: Orthopaedic Surgery

## 2018-04-17 DIAGNOSIS — M4807 Spinal stenosis, lumbosacral region: Secondary | ICD-10-CM

## 2018-04-17 DIAGNOSIS — M4802 Spinal stenosis, cervical region: Secondary | ICD-10-CM | POA: Diagnosis not present

## 2018-04-19 ENCOUNTER — Encounter (INDEPENDENT_AMBULATORY_CARE_PROVIDER_SITE_OTHER): Payer: Self-pay | Admitting: Specialist

## 2018-04-19 ENCOUNTER — Ambulatory Visit (INDEPENDENT_AMBULATORY_CARE_PROVIDER_SITE_OTHER): Payer: BLUE CROSS/BLUE SHIELD | Admitting: Specialist

## 2018-04-19 VITALS — BP 118/68 | HR 100 | Ht 72.0 in | Wt >= 6400 oz

## 2018-04-19 DIAGNOSIS — M5010 Cervical disc disorder with radiculopathy, unspecified cervical region: Secondary | ICD-10-CM | POA: Diagnosis not present

## 2018-04-19 DIAGNOSIS — M47816 Spondylosis without myelopathy or radiculopathy, lumbar region: Secondary | ICD-10-CM

## 2018-04-19 DIAGNOSIS — M4712 Other spondylosis with myelopathy, cervical region: Secondary | ICD-10-CM | POA: Diagnosis not present

## 2018-04-19 DIAGNOSIS — M4802 Spinal stenosis, cervical region: Secondary | ICD-10-CM

## 2018-04-19 DIAGNOSIS — M5136 Other intervertebral disc degeneration, lumbar region: Secondary | ICD-10-CM

## 2018-04-19 NOTE — Progress Notes (Addendum)
Office Visit Note   Patient: Mark Harrell           Date of Birth: 04-Jun-1960           MRN: [id number] Visit Date: 04/19/2018              Requested by: [name], Waldron Reubens [place], [place] [id number] PCP: [name], MD   Assessment & Plan: Visit Diagnoses:  1. Spinal stenosis of cervical region   2. Herniation of cervical intervertebral disc with radiculopathy   3. Other spondylosis with myelopathy, cervical region   4. Spondylosis without myelopathy or radiculopathy, lumbar region   5. DDD (degenerative disc disease), lumbar     Plan: Avoid overhead lifting and overhead use of the arms. Do not lift greater than 5 lbs. Adjust head rest in vehicle to prevent hyperextension if rear ended. Take extra precautions to avoid falling, including use of a cane if you feel weak. Scheduling secretary Kandice Hams. will call you to arrange for surgery for your cervical spine. If you wish a second opinion please let us know and we can arrange for you. If you have worsening arm or leg numbness or weakness please call or go to an ER. We will contact your cardiologist and primary care physicians to seek clearance for your surgery. Surgery will be an anterior cervical discectomy and fusion at the C5-6 and C6-7 levels with decompression of the cervical spinal canal at C4-5 and C5-6 and removal of spur and disc pressing on the spinal cord. An additional decompression fusion is done at the C5-6 and C6-7 level with bone grafting and plate and screws, Local bone graft as well and allograft bone graft and vivigen.  Risks of surgery include risks of infection, bleeding and risks to the spinal cord and  Risks of sore throat and difficulty swallowing which should  Improve over the next 4-6 weeks following surgery. Surgery is indicated due to upper extremity radiculopathy, lermittes phenomena and falls. In the future surgery at adjacent levels may be necessary but  these levels do not appear to be related to your current symptoms or signs.  Follow-Up Instructions: Return in about 3 weeks (around 05/10/2018).   Orders:  No orders of the defined types were placed in this encounter.  No orders of the defined types were placed in this encounter.     Procedures: No procedures performed   Clinical Data: Findings:  CLINICAL DATA:  Bilateral arm numbness extending to the hands.  EXAM: MRI CERVICAL SPINE WITHOUT CONTRAST  TECHNIQUE: Multiplanar, multisequence MR imaging of the cervical spine was performed. No intravenous contrast was administered.  COMPARISON:  06/22/2007  FINDINGS: Alignment: Physiologic.  Vertebrae: No fracture, evidence of discitis, or bone lesion.  Cord: Normal cord signal  Posterior Fossa, vertebral arteries, paraspinal tissues: Negative.  Disc levels:  C2-3: Shallow right paracentral disc protrusion with buttressing osteophytes. No impingement  C3-4: Patent canal and foramina  C4-5: Spondylosis with tiny central protrusion.  No impingement  C5-6: Disc bulge with right paracentral herniation compressing the cord, new. Mild bilateral foraminal narrowing  C6-7: Disc narrowing and bulging with ridging. Advanced bilateral foraminal stenosis, new. Mild spinal stenosis.  C7-T1:Small left paracentral protrusion without neural contact.  IMPRESSION: 1. C5-6 right paracentral protrusion with cord flattening. 2. C6-7 degenerative biforaminal impingement.   CLINICAL DATA:  Chronic low back pain radiating to both lower extremities.  EXAM: MRI LUMBAR SPINE WITHOUT CONTRAST  TECHNIQUE: Multiplanar, multisequence MR imaging of  the lumbar spine was performed. No intravenous contrast was administered.  COMPARISON:  Lumbar spine CT 03/25/2018  Lumbar spine MRI 04/26/2014  FINDINGS: Segmentation: Normal. The lowest disc space is considered to be L5-S1.  Alignment:  Normal  Vertebrae:  No acute compression fracture, discitis-osteomyelitis of focal marrow lesion.  Conus medullaris and cauda equina: The conus medullaris terminates at the L1 level. The cauda equina and conus medullaris are both normal.  Paraspinal and other soft tissues: The visualized aorta, IVC and iliac vessels are normal. The visualized retroperitoneal organs and paraspinal soft tissues are normal.  Disc levels: Sagittal plane imaging includes the T11-12 disc level through the upper sacrum, with axial imaging of the T12-L1 to L5-S1 disc levels.  The disc spaces from T11-L4 are normal.  L4-5: Mild facet hypertrophy and diffuse mild disc bulge without spinal canal or neural foraminal stenosis. Unchanged.  L5-S1: Moderate facet hypertrophy with small central disc protrusion and annular fissure, mildly worsened. There is narrowing of the left lateral recess. No central spinal canal stenosis. No neural foraminal stenosis.  The visualized portion of the sacrum is normal.  Radiographs 03/11/2017 Cervical spine There is poor resolution of the lower cervical segments due to body habitus and overlapping shoulders AP shows minimal left curve of the cervical spine less than 5 degrees. There is spondylosis changes of th e uncoveretebral joints associated with the C5-6 and C6-7 levels. Lateral radiograph demonstrates minimal disc  Narrowing without spur C3-4, Mild anterior spur formation C4-5. There is both DDD with disc narrowing and large Anterior disc osteophytes C5-6 and C6-7 with thickening of anterior spurs that are flowing along the anterior  Cervical spine C5 through T1 consistent with diffuse spinal hyperosteosis. Lordosis is well maintained.    Subjective: No chief complaint on file.   57 year right handed male with a long history of neck pain that has been worsening over the past half year. He has a history of depression, asthma and  Sleep apnea. Report pain in the neck is associated  with stiffness and shooting pain into the right thumb through long fingers. He has jolts of pain radiating down the right greater than left arm. He is having increasing difficulty walking and getting in and out of a car. Numbness into the left thumb through long finger. Unable to tolerate upward gaze due to severe neck and interscapular pain. Also is experiencing low back pain that is worse with turning or getting out of his car. He is unable to ambulate long distances. No bowel or bladder difficulty. BMI is elevated 50s.    Review of Systems  Constitutional: Negative.  Negative for activity change, appetite change, chills, diaphoresis, fatigue, fever and unexpected weight change.  HENT: Negative.   Eyes: Negative.   Respiratory: Negative.   Cardiovascular: Negative.   Gastrointestinal: Negative.   Endocrine: Negative.   Genitourinary: Negative.   Musculoskeletal: Negative.   Skin: Negative.   Allergic/Immunologic: Negative.   Neurological: Negative.   Hematological: Negative.   Psychiatric/Behavioral: Negative.      Objective: Vital Signs: BP 118/68   Pulse 100   Ht 6' (1.829 m)   Wt (!) 403 lb (182.8 kg)   BMI 54.66 kg/m   Physical Exam  Constitutional: He is oriented to person, place, and time. He appears well-developed and well-nourished.  HENT:  Head: Normocephalic and atraumatic.  Eyes: Pupils are equal, round, and reactive to light. EOM are normal.  Neck: Normal range of motion. Neck supple.  Pulmonary/Chest: Effort normal and  breath sounds normal.  Abdominal: Soft. Bowel sounds are normal.  Neurological: He is alert and oriented to person, place, and time.  Skin: Skin is warm and dry.  Psychiatric: He has a normal mood and affect. His behavior is normal. Judgment and thought content normal.    Back Exam   Tenderness  The patient is experiencing tenderness in the cervical.  Range of Motion  Extension:  50 abnormal  Flexion: 40  Lateral bend right:  60 abnormal   Lateral bend left:  70 abnormal  Rotation right:  50 abnormal  Rotation left: 70   Muscle Strength  Right Quadriceps:  5/5  Left Quadriceps:  5/5  Right Hamstrings:  5/5  Left Hamstrings:  5/5   Tests  Straight leg raise right: negative Straight leg raise left: negative  Reflexes  Patellar: 4/4 Achilles: 3/4 Biceps: 1/4 Babinski's sign: normal   Other  Heel walk: normal Sensation: normal  Comments:  Hoffman sign positive right thumb. Pathologic spread of reflex at the knee bilaterally. Weakness bilateral biceps 4+/5, Weak bilateral wrist volar flexion 5-/5      Specialty Comments:  No specialty comments available.  Imaging: No results found.   PMFS History: Patient Active Problem List   Diagnosis Date Noted  . Chronic respiratory failure with hypoxia ([place]) 04/12/2018  . Bilateral lower extremity edema 12/29/2017  . Hymenoptera allergy 11/10/2017  . Seasonal and perennial allergic rhinitis 04/23/2017  . 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 Cervicalgia 04/01/2017  . Headache 11/13/2016  . Migraine without aura and without status migrainosus, not intractable 10/30/2016  . Irritant contact dermatitis due to frequent handwashing 06/08/2016  . Lumbar radiculopathy 05/15/2016  . Osteoarthritis of spine with radiculopathy, lumbar region 05/15/2016  . Risk for falls 05/15/2016  . Acute medial meniscal tear, left, subsequent encounter 04/23/2016  . Recurrent infections 03/29/2016  . Chronic nonseasonal allergic rhinitis due to pollen 03/29/2016  . Polypharmacy 01/16/2016  . Morbid obesity with BMI of 50.0-59.9, adult ([place]) 03/06/2015  . SDAT 02/05/2015  . Asthma 02/05/2015  . OSA and COPD overlap syndrome ([place]) 02/05/2015  . Medication management 08/02/2014  . GERD (gastroesophageal reflux disease) 05/09/2014  . Vitamin D deficiency 08/01/2013  . Prediabetes 08/01/2013  . 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 Diverticula of colon 05/07/2011  . Hypertension 01/31/2011   . Hyperlipidemia, mixed 01/31/2011  . BPH (benign prostatic hyperplasia) 01/31/2011  . Testosterone Deficiency 01/31/2011  . IBS (irritable bowel syndrome) 01/31/2011  . Partial complex seizure disorder with intractable epilepsy ([place]) 01/31/2011  . Depression, major, recurrent, in partial remission ([place]) 01/31/2011  . Asthma-COPD overlap syndrome ([place]) 01/31/2011   Past Medical History:  Diagnosis Date  . Anesthesia complication requiring reversal agent administration    ? from central apnea, very difficult to get off vent  . Anxiety   . Arthritis    osteo  . Asthma   . BPH (benign prostatic hyperplasia)   . Complication of anesthesia    difficulty waking , they twlight me because of my respiratory problems "  . Dyspnea   . Enlarged heart   . Family history of adverse reaction to anesthesia    mother trouble waking up, and heart stopped  . GERD (gastroesophageal reflux disease)   . Headache    botox injections for headaches  . Hyperlipidemia   . Hypertension   . Hypogonadism male   . IBS (irritable bowel syndrome)   . Obesity   . OSA (obstructive sleep apnea)    cpap  .  Pneumonia   . Pre-diabetes   . Prostatitis     Family History  Problem Relation Age of Onset  . Diabetes Paternal Uncle   . Cancer Father        lymphoma, colon  . Diabetes Maternal Grandmother   . Heart disease Maternal Grandfather   . Diabetes Maternal Grandfather   . Diabetes Paternal Grandmother   . Diabetes Paternal Grandfather   . Dementia Mother   . Prostate cancer Maternal Uncle   . Lung disease Neg Hx   . Rheumatologic disease Neg Hx     Past Surgical History:  Procedure Laterality Date  . ABDOMINAL SURGERY    . ANKLE FRACTURE SURGERY Right   . CYSTOSCOPY     [place]  . KNEE ARTHROSCOPY WITH MEDIAL MENISECTOMY Left 01/02/2017   Procedure: LEFT KNEE ARTHROSCOPY WITH PARTIAL MEDIAL MENISCECTOMY;  Surgeon: Mcarthur Rossetti, MD;  Location: [facility];  Service: Orthopedics;   Laterality: Left;  . TONSILLECTOMY    . [place] RESECTION  2007  . UVULOPALATOPHARYNGOPLASTY     Social History   Occupational History  . Occupation: Dance movement psychotherapist  Tobacco Use  . Smoking status: Former Smoker    Packs/day: 0.10    Years: 15.00    Pack years: 1.50  . Smokeless tobacco: Never Used  . Tobacco comment: significant second-hand exposure through mother  Substance and Sexual Activity  . Alcohol use: Yes    Alcohol/week: 1.0 standard drinks    Types: 1 Glasses of wine per week    Comment: 2 x a year  . Drug use: No  . Sexual activity: Never

## 2018-04-19 NOTE — Patient Instructions (Signed)
Avoid overhead lifting and overhead use of the arms. Do not lift greater than 5 lbs. Adjust head rest in vehicle to prevent hyperextension if rear ended. Take extra precautions to avoid falling, including use of a cane if you feel weak. Scheduling secretary Kandice Hams. will call you to arrange for surgery for your cervical spine. If you wish a second opinion please let us know and we can arrange for you. If you have worsening arm or leg numbness or weakness please call or go to an ER. We will contact your cardiologist and primary care physicians to seek clearance for your surgery. Surgery will be an anterior cervical discectomy and fusion at the C5-6 and C6-7 levels with decompression of the cervical spinal canal at C4-5 and C5-6 and removal of spur and disc pressing on the spinal cord. An additional decompression fusion is done at the C5-6 and C6-7 level with bone grafting and plate and screws, Local bone graft as well and allograft bone graft and vivigen.  Risks of surgery include risks of infection, bleeding and risks to the spinal cord and  Risks of sore throat and difficulty swallowing which should  Improve over the next 4-6 weeks following surgery. Surgery is indicated due to upper extremity radiculopathy, lermittes phenomena and falls. In the future surgery at adjacent levels may be necessary but these levels do not appear to be related to your current symptoms or signs.

## 2018-04-20 DIAGNOSIS — G4733 Obstructive sleep apnea (adult) (pediatric): Secondary | ICD-10-CM | POA: Diagnosis not present

## 2018-04-28 ENCOUNTER — Ambulatory Visit: Payer: BLUE CROSS/BLUE SHIELD | Admitting: Pulmonary Disease

## 2018-04-28 NOTE — Progress Notes (Signed)
LMOMTCB x 1 

## 2018-05-04 ENCOUNTER — Other Ambulatory Visit: Payer: Self-pay | Admitting: Internal Medicine

## 2018-05-04 DIAGNOSIS — I872 Venous insufficiency (chronic) (peripheral): Secondary | ICD-10-CM

## 2018-05-04 DIAGNOSIS — G43709 Chronic migraine without aura, not intractable, without status migrainosus: Secondary | ICD-10-CM | POA: Diagnosis not present

## 2018-05-06 DIAGNOSIS — M5416 Radiculopathy, lumbar region: Secondary | ICD-10-CM | POA: Diagnosis not present

## 2018-05-06 DIAGNOSIS — M4722 Other spondylosis with radiculopathy, cervical region: Secondary | ICD-10-CM | POA: Diagnosis not present

## 2018-05-06 DIAGNOSIS — M7918 Myalgia, other site: Secondary | ICD-10-CM | POA: Diagnosis not present

## 2018-05-11 ENCOUNTER — Ambulatory Visit: Payer: BLUE CROSS/BLUE SHIELD | Admitting: Allergy & Immunology

## 2018-05-11 DIAGNOSIS — J309 Allergic rhinitis, unspecified: Secondary | ICD-10-CM

## 2018-05-16 DIAGNOSIS — J9611 Chronic respiratory failure with hypoxia: Secondary | ICD-10-CM | POA: Diagnosis not present

## 2018-05-16 DIAGNOSIS — J449 Chronic obstructive pulmonary disease, unspecified: Secondary | ICD-10-CM | POA: Diagnosis not present

## 2018-05-20 DIAGNOSIS — G4733 Obstructive sleep apnea (adult) (pediatric): Secondary | ICD-10-CM | POA: Diagnosis not present

## 2018-05-24 ENCOUNTER — Ambulatory Visit: Payer: BLUE CROSS/BLUE SHIELD | Admitting: Adult Health

## 2018-05-29 ENCOUNTER — Other Ambulatory Visit: Payer: Self-pay | Admitting: Internal Medicine

## 2018-05-31 ENCOUNTER — Ambulatory Visit (INDEPENDENT_AMBULATORY_CARE_PROVIDER_SITE_OTHER): Payer: BLUE CROSS/BLUE SHIELD | Admitting: Adult Health

## 2018-05-31 ENCOUNTER — Encounter: Payer: Self-pay | Admitting: Adult Health

## 2018-05-31 DIAGNOSIS — G4733 Obstructive sleep apnea (adult) (pediatric): Secondary | ICD-10-CM

## 2018-05-31 DIAGNOSIS — J3089 Other allergic rhinitis: Secondary | ICD-10-CM | POA: Diagnosis not present

## 2018-05-31 DIAGNOSIS — J449 Chronic obstructive pulmonary disease, unspecified: Secondary | ICD-10-CM

## 2018-05-31 DIAGNOSIS — J302 Other seasonal allergic rhinitis: Secondary | ICD-10-CM

## 2018-05-31 DIAGNOSIS — J9611 Chronic respiratory failure with hypoxia: Secondary | ICD-10-CM

## 2018-05-31 NOTE — Assessment & Plan Note (Signed)
Restart CPAP  At bedtime

## 2018-05-31 NOTE — Assessment & Plan Note (Signed)
Chronic obstructive asthma currently well controlled.  Patient is continue on trigger prevention.  Plan

## 2018-05-31 NOTE — Assessment & Plan Note (Signed)
Control for triggers  Cont on current regimen

## 2018-05-31 NOTE — Assessment & Plan Note (Signed)
Compensated on present regimen   Plan  Patient Instructions  Continue on TRELEGY 1 puff daily . Rinse after use.  Continue on Singulair and Zyrtec daily .  Continue on Oxygen 2l/m with activity and At bedtime   Restart CPAP  At bedtime   Try nasal mask . Try snore strip At bedtime   Try to wear for at least 4hr each night.  Work on weight loss.  Do not drive if sleepy .  Continue on nasal sprays.  Follow up Dr. [name] in 3-4 months and As needed   Please contact office for sooner follow up if symptoms do not improve or worsen or seek emergency care

## 2018-05-31 NOTE — Assessment & Plan Note (Signed)
Cont on O2 .  

## 2018-05-31 NOTE — Patient Instructions (Signed)
Continue on TRELEGY 1 puff daily . Rinse after use.  Continue on Singulair and Zyrtec daily .  Continue on Oxygen 2l/m with activity and At bedtime   Restart CPAP  At bedtime   Try nasal mask . Try snore strip At bedtime   Try to wear for at least 4hr each night.  Work on weight loss.  Do not drive if sleepy .  Continue on nasal sprays.  Follow up Dr. [name] in 3-4 months and As needed   Please contact office for sooner follow up if symptoms do not improve or worsen or seek emergency care

## 2018-05-31 NOTE — Progress Notes (Signed)
@Patient  ID: Mark Harrell, male    DOB: 1961-03-08, 58 y.o.   MRN: [id number]  Chief Complaint  Patient presents with  . Follow-up    COPD     Referring provider: [name], MD  HPI: 58 year old male never smoker followed for asthma, COPD, obstructive sleep apnea and oxygen dependent respiratory failure Medical history significant for chronic sinusitis followed by ENT, Dr. Lucia Gaskins, previous sinus surgery - with turbinate reduction, uvuloplasty and tonsillectomy .  Memory  Issues - on Aricept and Namenda.   TEST/EVENTS :  FENO 12/28/2017-10  PFTs 12/28/2017 FVC 3.41 [65%), FEV1 2.69 [67%], F/F 79, TLC 79%, DLCO 94% Minimal obstruction, mild restriction  03/09/17: Walked 2 laps / Baseline Sat 97% on RA / Nadir Sat 97% on RA (walked at a slow pace with a cane - stopped due to dyspnea) 05/08/16: Walked 321 meters / Baseline Sat 93% on RA / Nadir Sat 93% on RA @ rest  SPLIT NIGHT SLEEP STUDY (05/07/16): Severe obstructive sleep apnea with AHI 65.7 events/hour. No significant central sleep apnea occurred. Severe desaturation during diagnostic portion to 72% on room air. No cardiac abnormalities or periodic limb movements were noted. Optimal CPAP pressure was 18 cm H2O with a large-sized [employer] Full Face Mask Simplus mask. Reading physician recommended AutoPap at 10-18 cm H2O pressure.  CT chest 01/04/2017- no PE, lungs are clear. Chest x-ray 12/08/2017- no active cardiopulmonary disease. I reviewed the images personally.  ESOPHAGRAM/BARIUM SWALLOW 04/15/16 (per radiologist):Mild nonspecific esophageal dysmotility disorder. Episode of flash laryngeal penetration with swallowing. Small sliding type I hiatal hernia. No visible mass, erosion, or ulcerations.  MICROBIOLOGY Sputum Culture 02/16/17: Normal Flora   LABS 04/10/16 Alpha-1 antitrypsin: MM (135)  03/28/16 CBC: 11.8/14.2/43.0/309 Eosinophils: 354 IgG: 583 IgA: 154 IgM: 46 IgE: 92   11/02/13 ANA:  Negative DS DNA Ab: <1 SSA: <1.0 SSB: <1.0  02/24/11 ACE: 35 RF: <10  Previously on Nucala in 2018 with no perceived benefit Dupixent started January 2019 followed by allergy and immunology  Personal/social history: No pets, no known occupational exposures, no hot tubs, no basement, never smoker.  Previously lived in [place], [place], [place], [place] Self employed.   05/31/2018 Follow up: Chronic Asthma/COPD, OSA , O2 RF  Patient returns for a 14-month follow-up.  Patient has underlying chronic obstructive asthma.  He remains on Trelegy and Singulair daily.  He is on Dupixent.  Says overall breathing is doing okay  With no flare of cough or wheezing .  No increased SABA use. Is deconditioned, has multiple medical issues including DJD and is unable to exercise due to neck and back pain.  Gets winded with heavy activity , can not walk long distances.   Patient is followed by allergy.  Is on doxycycline prophylaxis  for recurrent infection and is undergoing work-up for immune deficiency.  He remains on oxygen 2 L.  Patient has underlying severe sleep apnea.  Is supposed to be on CPAP.  But has had 2 previous episodes of aspiration pneumonia while he was wearing CPAP.  He was restarted on CPAP last visit however was unable to get his CPAP supplies. Says he will start on this when supplies come in.    Allergies  Allergen Reactions  . Bee Venom Swelling  . Ppd [Tuberculin Purified Protein Derivative] Other (See Comments)    +ppd NEG Quantferron Gold 3/13  . Fenofibrate Other (See Comments)    Back pain  . Levofloxacin Diarrhea  . Other Other (See Comments)  Some antibiotics cause diarrhea  . Verapamil Other (See Comments)    Back pain  . Claritin [Loratadine] Other (See Comments)    unknown  . Duloxetine     Brought on asthma    Immunization History  Administered Date(s) Administered  . Influenza,inj,Quad PF,6+ Mos 03/04/2016, 03/09/2017  . Influenza-Unspecified  02/24/2015, 05/19/2018  . PPD Test 08/06/2011  . Pneumococcal Conjugate-13 11/02/2014  . Pneumococcal Polysaccharide-23 04/10/2016  . Pneumococcal-Unspecified 05/26/2004  . Td 05/26/2000  . Tdap 08/01/2013  . Zoster 05/26/2009  . Zoster Recombinat (Shingrix) 11/05/2016    Past Medical History:  Diagnosis Date  . Anesthesia complication requiring reversal agent administration    ? from central apnea, very difficult to get off vent  . Anxiety   . Arthritis    osteo  . Asthma   . BPH (benign prostatic hyperplasia)   . Complication of anesthesia    difficulty waking , they twlight me because of my respiratory problems "  . Dyspnea   . Enlarged heart   . Family history of adverse reaction to anesthesia    mother trouble waking up, and heart stopped  . GERD (gastroesophageal reflux disease)   . Headache    botox injections for headaches  . Hyperlipidemia   . Hypertension   . Hypogonadism male   . IBS (irritable bowel syndrome)   . Obesity   . OSA (obstructive sleep apnea)    cpap  . Pneumonia   . Pre-diabetes   . Prostatitis     Tobacco History: Social History   Tobacco Use  Smoking Status Former Smoker  . Packs/day: 0.10  . Years: 15.00  . Pack years: 1.50  Smokeless Tobacco Never Used  Tobacco Comment   significant second-hand exposure through mother   Counseling given: Not Answered Comment: significant second-hand exposure through mother   Outpatient Medications Prior to Visit  Medication Sig Dispense Refill  . albuterol (PROVENTIL HFA;VENTOLIN HFA) 108 (90 Base) MCG/ACT inhaler 1 to 2 inhalations 10 to 15 minutes apart every 4 hrs to rescue Asthma 60 g 1  . albuterol (PROVENTIL) (2.5 MG/3ML) 0.083% nebulizer solution Inhale 3 mLs (2.5 mg total) into the lungs every 4 (four) hours as needed. 75 mL 12  . alfuzosin (UROXATRAL) 10 MG 24 hr tablet Take 10 mg by mouth daily.      [name] anastrozole (ARIMIDEX) 1 MG tablet daily.    . baclofen (LIORESAL) 10 MG tablet  Take 10 mg by mouth 3 (three) times daily as needed for muscle spasms.     . bisoprolol-hydrochlorothiazide (ZIAC) 5-6.25 MG tablet TAKE 1 TABLET BY MOUTH EVERY DAY 90 tablet 1  . Botulinum Toxin Type A 200 units SOLR Inject into the skin.    . Calcium Carbonate-Vitamin D (CALCIUM + D PO) Take 500 mg by mouth 3 (three) times daily.     . cetaphil (CETAPHIL) lotion Apply topically 2 (two) times daily. 236 mL 0  . cetirizine (ZYRTEC) 10 MG tablet Take 10 mg by mouth daily.    . Cholecalciferol (VITAMIN D PO) Take 10,000-20,000 Units by mouth 2 (two) times daily. 20000 in the morning and 10000 in the evening    . Cinnamon 500 MG capsule Take by mouth.    . clotrimazole-betamethasone (LOTRISONE) cream APPLY TO AFFECTED AREA TWICE A DAY 15 g 2  . co-enzyme Q-10 30 MG capsule Take 30 mg by mouth daily.    . cyclobenzaprine (FLEXERIL) 10 MG tablet Take 10 mg by mouth 3 (three) times  daily.     . desmopressin (DDAVP) 0.2 MG tablet Take 200 mcg by mouth daily.   3  . diclofenac (VOLTAREN) 75 MG EC tablet Take 1 tablet (75 mg total) by mouth 2 (two) times daily. 180 tablet 1  . diclofenac sodium (VOLTAREN) 1 % GEL APPLY 4 GRAMS TOPICALLY 4 TIMES DAILY 100 g 3  . donepezil (ARICEPT) 23 MG TABS tablet Take 23 mg by mouth at bedtime.    [name] doxycycline (VIBRAMYCIN) 100 MG capsule Take 100 mg by mouth 2 (two) times daily.    . Dupilumab, Asthma, (DUPIXENT) 200 MG/1.14ML SOSY Inject into the skin. Injects bi-weekly    . DYMISTA 137-50 MCG/ACT SUSP PLACE 2 SPRAYS INTO BOTH NOSTRILS 2 (TWO) TIMES DAILY. 23 g 5  . Eluxadoline (VIBERZI) 100 MG TABS Take 1 tablet by mouth 2 (two) times daily.     [name] EPINEPHrine 0.3 mg/0.3 mL IJ SOAJ injection Inject as directed.    . Fluticasone-Umeclidin-Vilant (TRELEGY ELLIPTA) 100-62.5-25 MCG/INH AEPB Inhale 1 puff into the lungs daily. 28 each 2  . furosemide (LASIX) 80 MG tablet TAKE 1 TO 1 & 1/2 TABLETS 2 X /DAY FOR FLUID RETENTION & SWELLING OF LEGS 270 tablet 1  .  Galcanezumab-gnlm (EMGALITY) 120 MG/ML SOAJ Inject into the skin. Pt receives this monthly ingection    . GLUCOSAMINE-CHONDROITIN PO Take 3 tablets by mouth daily.    [name] guaiFENesin (MUCINEX) 600 MG 12 hr tablet Take 600 mg by mouth 2 (two) times daily as needed for cough or to loosen phlegm. Instructed to take 3 tablets in the morning    . KRILL OIL PO Take by mouth.    . LUTEIN PO Take 1 tablet by mouth 2 (two) times daily.    [name] LYRICA 150 MG capsule Take 2 capsules (300 mg total) by mouth 2 (two) times daily. 120 capsule 2  . memantine (NAMENDA) 10 MG tablet Take 10 mg by mouth daily.     . metFORMIN (GLUCOPHAGE-XR) 500 MG 24 hr tablet Take 2 tablets 2 x /day with Breakfast & Supper  for Diabetes 360 tablet 3  . metolazone (ZAROXOLYN) 5 MG tablet Take 1 tablet daily for Fluid Retention 90 tablet 1  . montelukast (SINGULAIR) 10 MG tablet TAKE 1 TABLET BY MOUTH EVERY DAY 90 tablet 3  . Multiple Vitamins-Minerals (MULTIVITAMIN WITH MINERALS) tablet Take 1 tablet by mouth daily.    . naloxone (NARCAN) nasal spray 4 mg/0.1 mL USE AS DIRECTED    . ondansetron (ZOFRAN-ODT) 4 MG disintegrating tablet TAKE 1 TABLET (4 MG TOTAL) BY MOUTH EVERY 6 (SIX) HOURS AS NEEDED FOR NAUSEA OR VOMITING. 30 tablet 0  . oxymetazoline (AFRIN) 0.05 % nasal spray Place 1 spray into both nostrils See admin instructions. 1 SPRAY PER SIDE EACH NIGHT BEFORE DYMISTA    . pentosan polysulfate (ELMIRON) 100 MG capsule Take 100 mg by mouth 3 (three) times daily before meals.     . potassium chloride SA (K-DUR,KLOR-CON) 20 MEQ tablet Take 1 tablet 2 x /day  For potassium 180 tablet 1  . pravastatin (PRAVACHOL) 40 MG tablet TAKE 1 TABLET BY MOUTH EVERYDAY AT BEDTIME (Patient taking differently: Take 20 mg by mouth daily. TAKE 1 TABLET BY MOUTH EVERYDAY AT BEDTIME) 90 tablet 1  . PRESCRIPTION MEDICATION Pt receives weekly allergy shots    . PROBIOTIC PRODUCT PO Take by mouth.    . sertraline (ZOLOFT) 50 MG tablet Take 50 mg by mouth  daily.    [name]  sodium chloride (OCEAN) 0.65 % SOLN nasal spray Place 1 spray into both nostrils 4 (four) times daily as needed for congestion. Uses each time before other nasal sprays    . tadalafil (CIALIS) 5 MG tablet Take 5 mg by mouth every evening.     . triamcinolone cream (KENALOG) 0.1 % APPLY TOPICALLY 4 TIMES A DAY (Patient taking differently: APPLY TOPICALLY 2 TIMES A DAY AS NEEDED for itching) 30 g 1  . trospium (SANCTURA) 20 MG tablet Take 20 mg by mouth 2 (two) times daily.   2  . TURMERIC PO Take 3 capsules by mouth daily.    [name] lidocaine (LIDODERM) 5 % Place 1 patch onto the skin daily. Remove & Discard patch within 12 hours or as directed by MD (Patient not taking: Reported on 05/31/2018) 14 patch 0  . ranitidine (ZANTAC) 150 MG capsule Take 1 capsule (150 mg total) by mouth 2 (two) times daily. (Patient not taking: Reported on 05/31/2018) 90 capsule 2   Facility-Administered Medications Prior to Visit  Medication Dose Route Frequency Provider Last Rate Last Dose  . Mepolizumab SOLR 100 mg  100 mg Subcutaneous Q28 days [name], MD   100 mg at 05/11/17 1546     Review of Systems  Constitutional:   No  weight loss, night sweats,  Fevers, chills,  +fatigue, or  lassitude.  HEENT:   No headaches,  Difficulty swallowing,  Tooth/dental problems, or  Sore throat,                No sneezing, itching, ear ache, nasal congestion, post nasal drip,   CV:  No chest pain,  Orthopnea, PND, swelling in lower extremities, anasarca, dizziness, palpitations, syncope.   GI  No heartburn, indigestion, abdominal pain, nausea, vomiting, diarrhea, change in bowel habits, loss of appetite, bloody stools.   Resp:   No excess mucus, no productive cough,  No non-productive cough,  No coughing up of blood.  No change in color of mucus.  No wheezing.  No chest wall deformity  Skin: no rash or lesions.  GU: no dysuria, change in color of urine, no urgency or frequency.  No flank pain, no  hematuria   MS: +neck and back pain -chronic     Physical Exam  BP 112/64 (BP Location: Left Arm, Cuff Size: Large)   Pulse 99   Ht 6' (1.829 m)   Wt (!) 391 lb 3.2 oz (177.4 kg)   SpO2 95%   BMI 53.06 kg/m   GEN: A/Ox3; pleasant , NAD, morbid obesity    HEENT:  [place]/AT,  EACs-clear, TMs-wnl, NOSE-clear, THROAT-clear, no lesions, no postnasal drip or exudate noted. Class 3 MP airway   NECK:  Supple w/ fair ROM; no JVD; normal carotid impulses w/o bruits; no thyromegaly or nodules palpated; no lymphadenopathy.    RESP  Clear  P & A; w/o, wheezes/ rales/ or rhonchi. no accessory muscle use, no dullness to percussion  CARD:  RRR, no m/r/g, 1+  peripheral edema, pulses intact, no cyanosis or clubbing.  GI:   Soft & nt; nml bowel sounds; no organomegaly or masses detected.   Musco: Warm bil, no deformities or joint swelling noted.   Neuro: alert, no focal deficits noted.    Skin: Warm, no lesions or rashes    Lab Results:  CBC  ProBNP    Component Value Date/Time   PROBNP 37.0 08/13/2017 1532    Imaging: No results found.      Lab  Results  Component Value Date   NITRICOXIDE 10 12/28/2017        Assessment & Plan:   Asthma Chronic obstructive asthma currently well controlled.  Patient is continue on trigger prevention.  Plan     Asthma-COPD overlap syndrome (HCC) Compensated on present regimen   Plan  Patient Instructions  Continue on TRELEGY 1 puff daily . Rinse after use.  Continue on Singulair and Zyrtec daily .  Continue on Oxygen 2l/m with activity and At bedtime   Restart CPAP  At bedtime   Try nasal mask . Try snore strip At bedtime   Try to wear for at least 4hr each night.  Work on weight loss.  Do not drive if sleepy .  Continue on nasal sprays.  Follow up Dr. [name] in 3-4 months and As needed   Please contact office for sooner follow up if symptoms do not improve or worsen or seek emergency care       Seasonal and perennial  allergic rhinitis Control for triggers  Cont on current regimen   Chronic respiratory failure with hypoxia (HCC) Cont on O2   OSA and COPD overlap syndrome (HCC) Restart CPAP  At bedtime       Rexene Edison, NP 05/31/2018

## 2018-06-01 LAB — MYCOBACTERIA,CULT W/FLUOROCHROME SMEAR
MICRO NUMBER: 91405308
SMEAR: NONE SEEN
SPECIMEN QUALITY:: ADEQUATE

## 2018-06-01 LAB — RESPIRATORY CULTURE OR RESPIRATORY AND SPUTUM CULTURE
MICRO NUMBER:: 91405309
RESULT:: NORMAL
SPECIMEN QUALITY:: ADEQUATE

## 2018-06-03 ENCOUNTER — Encounter: Payer: Self-pay | Admitting: Podiatry

## 2018-06-03 ENCOUNTER — Ambulatory Visit: Payer: BLUE CROSS/BLUE SHIELD | Admitting: Podiatry

## 2018-06-03 DIAGNOSIS — M79676 Pain in unspecified toe(s): Secondary | ICD-10-CM | POA: Diagnosis not present

## 2018-06-03 DIAGNOSIS — B351 Tinea unguium: Secondary | ICD-10-CM

## 2018-06-03 DIAGNOSIS — M722 Plantar fascial fibromatosis: Secondary | ICD-10-CM

## 2018-06-03 NOTE — Progress Notes (Signed)
He presents today chief complaint of painful elongated toenails bilateral he is also complaining of painful heels bilaterally.  Objective: Vital signs are stable he is alert and oriented x3 has pain to palpation medial calcaneal tubercles bilateral heels.  His toenails are long thick yellow dystrophic-like mycotic and painful he incurvated.  Assessment: Plantar fasciitis bilateral pain in limb secondary to onychomycosis and ingrown nails.  Plan: Debridement of toenails 1 through 5 bilateral.  Injected the bilateral heels with 20 mg Kenalog 5 mg Marcaine point of maximal tenderness bilateral after sterile Betadine skin prep.  Tolerated procedure well without complications.  Follow-up with him in 3 months.

## 2018-06-04 ENCOUNTER — Other Ambulatory Visit (INDEPENDENT_AMBULATORY_CARE_PROVIDER_SITE_OTHER): Payer: Self-pay | Admitting: Orthopaedic Surgery

## 2018-06-14 ENCOUNTER — Ambulatory Visit: Payer: Self-pay | Admitting: Internal Medicine

## 2018-06-16 DIAGNOSIS — G4733 Obstructive sleep apnea (adult) (pediatric): Secondary | ICD-10-CM | POA: Diagnosis not present

## 2018-06-16 DIAGNOSIS — G43709 Chronic migraine without aura, not intractable, without status migrainosus: Secondary | ICD-10-CM | POA: Diagnosis not present

## 2018-06-16 DIAGNOSIS — R419 Unspecified symptoms and signs involving cognitive functions and awareness: Secondary | ICD-10-CM | POA: Diagnosis not present

## 2018-06-16 DIAGNOSIS — J449 Chronic obstructive pulmonary disease, unspecified: Secondary | ICD-10-CM | POA: Diagnosis not present

## 2018-06-16 DIAGNOSIS — J9611 Chronic respiratory failure with hypoxia: Secondary | ICD-10-CM | POA: Diagnosis not present

## 2018-06-16 DIAGNOSIS — F3341 Major depressive disorder, recurrent, in partial remission: Secondary | ICD-10-CM | POA: Diagnosis not present

## 2018-06-20 DIAGNOSIS — G4733 Obstructive sleep apnea (adult) (pediatric): Secondary | ICD-10-CM | POA: Diagnosis not present

## 2018-06-25 ENCOUNTER — Encounter: Payer: Self-pay | Admitting: Cardiology

## 2018-06-25 ENCOUNTER — Ambulatory Visit: Payer: BLUE CROSS/BLUE SHIELD | Admitting: Medical

## 2018-06-25 VITALS — BP 122/80 | HR 83 | Ht 72.0 in | Wt 385.6 lb

## 2018-06-25 DIAGNOSIS — Z01818 Encounter for other preprocedural examination: Secondary | ICD-10-CM | POA: Diagnosis not present

## 2018-06-25 DIAGNOSIS — J449 Chronic obstructive pulmonary disease, unspecified: Secondary | ICD-10-CM

## 2018-06-25 DIAGNOSIS — I10 Essential (primary) hypertension: Secondary | ICD-10-CM

## 2018-06-25 DIAGNOSIS — G4733 Obstructive sleep apnea (adult) (pediatric): Secondary | ICD-10-CM | POA: Diagnosis not present

## 2018-06-25 DIAGNOSIS — R600 Localized edema: Secondary | ICD-10-CM | POA: Diagnosis not present

## 2018-06-25 NOTE — Progress Notes (Signed)
Cardiology Office Note   Date:  06/25/2018   ID:  Mark Harrell, DOB [birth year], MRN [id number]  PCP:  [name], MD  Cardiologist:  Quay Burow, MD EP: None  Chief Complaint  Patient presents with  . Follow-up  . Pre-op Exam      History of Present Illness: Mark Harrell is a 57 y.o. male with PMH of HTN, HLD, COPD on O2 2L, OSA not on CPAP, and morbid obesity, who presents for routine follow-up.  Patient was last seen by cardiology, Dr. [name], outpatient 12/2017 at which time he reported having intermittent chest discomfort which was felt to be 2/2 esophageal spasms or GERD. He was felt to be a poor candidate for non-invasive and invasive ischemic testing at that time. He was continued on bisoprolol-HCTZ for management of HTN and LE edema. His last echocardiogram was 02/2017 and showed EF 60-65%, G1DD, and mildly dilated LA.   He returns today for follow-up. He reports doing well from a cardiac standpoint since he was last seen by Dr. [name]. He reported a couple episodes of esophageal spasms ~1-2 months after his last visit. Also with a fall 02/2018 which resulted in issues with his neck/back, for which he is anticipating surgery with Dr. [name]. He asks if our office has received a request for preoperative evaluation from Dr. Otho Ket office, which I do not see in our system. He had been working on watching his diet and increasing his exercise prior to his fall and has lost 25lbs in the last 6 months, for which I congratulated him. Since the fall he has been somewhat limited in activity but is able to walk a couple blocks or go up a flight of stairs without anginal complaints. He denies experiencing chest pain/pressure in recent months. He reports improvement in LE edema since his last visit, for which he takes lasix 80mg  2x per week. He is working on acquiring a new CPAP mask so that he can get back to using his CPAP machine. No complaints of orthopnea, dizziness,  lightheadedness, or syncope.       Past Medical History:  Diagnosis Date  . Anesthesia complication requiring reversal agent administration    ? from central apnea, very difficult to get off vent  . Anxiety   . Arthritis    osteo  . Asthma   . BPH (benign prostatic hyperplasia)   . Complication of anesthesia    difficulty waking , they twlight me because of my respiratory problems "  . Dyspnea   . Enlarged heart   . Family history of adverse reaction to anesthesia    mother trouble waking up, and heart stopped  . GERD (gastroesophageal reflux disease)   . Headache    botox injections for headaches  . Hyperlipidemia   . Hypertension   . Hypogonadism male   . IBS (irritable bowel syndrome)   . Obesity   . OSA (obstructive sleep apnea)    cpap  . Pneumonia   . Pre-diabetes   . Prostatitis     Past Surgical History:  Procedure Laterality Date  . ABDOMINAL SURGERY    . ANKLE FRACTURE SURGERY Right   . CYSTOSCOPY     [place]  . KNEE ARTHROSCOPY WITH MEDIAL MENISECTOMY Left 01/02/2017   Procedure: LEFT KNEE ARTHROSCOPY WITH PARTIAL MEDIAL MENISCECTOMY;  Surgeon: Mcarthur Rossetti, MD;  Location: [facility];  Service: Orthopedics;  Laterality: Left;  . TONSILLECTOMY    . [place] RESECTION  2007  .  UVULOPALATOPHARYNGOPLASTY       Current Outpatient Medications  Medication Sig Dispense Refill  . albuterol (PROVENTIL HFA;VENTOLIN HFA) 108 (90 Base) MCG/ACT inhaler 1 to 2 inhalations 10 to 15 minutes apart every 4 hrs to rescue Asthma 60 g 1  . albuterol (PROVENTIL) (2.5 MG/3ML) 0.083% nebulizer solution Inhale 3 mLs (2.5 mg total) into the lungs every 4 (four) hours as needed. 75 mL 12  . alfuzosin (UROXATRAL) 10 MG 24 hr tablet Take 10 mg by mouth daily.      [name] anastrozole (ARIMIDEX) 1 MG tablet daily.    . baclofen (LIORESAL) 10 MG tablet Take 10 mg by mouth 3 (three) times daily as needed for muscle spasms.     . bisoprolol-hydrochlorothiazide (ZIAC) 5-6.25 MG  tablet TAKE 1 TABLET BY MOUTH EVERY DAY 90 tablet 1  . Botulinum Toxin Type A 200 units SOLR Inject into the skin.    . Calcium Carbonate-Vitamin D (CALCIUM + D PO) Take 500 mg by mouth 3 (three) times daily.     . cetaphil (CETAPHIL) lotion Apply topically 2 (two) times daily. 236 mL 0  . cetirizine (ZYRTEC) 10 MG tablet Take 10 mg by mouth daily.    . Cholecalciferol (VITAMIN D PO) Take 10,000-20,000 Units by mouth 2 (two) times daily. 20000 in the morning and 10000 in the evening    . Cinnamon 500 MG capsule Take by mouth.    . clotrimazole-betamethasone (LOTRISONE) cream APPLY TO AFFECTED AREA TWICE A DAY 15 g 2  . co-enzyme Q-10 30 MG capsule Take 30 mg by mouth daily.    . cyclobenzaprine (FLEXERIL) 10 MG tablet Take 10 mg by mouth 3 (three) times daily.     . diclofenac (VOLTAREN) 75 MG EC tablet TAKE 1 TABLET TWICE DAILY AS NEEDED FOR PAIN 180 tablet 1  . diclofenac sodium (VOLTAREN) 1 % GEL APPLY 4 GRAMS TOPICALLY 4 TIMES DAILY 100 g 3  . donepezil (ARICEPT) 23 MG TABS tablet Take 23 mg by mouth at bedtime.    [name] doxycycline (VIBRAMYCIN) 100 MG capsule Take 100 mg by mouth 2 (two) times daily.    . Dupilumab, Asthma, (DUPIXENT) 200 MG/1.14ML SOSY Inject into the skin. Injects bi-weekly    . DYMISTA 137-50 MCG/ACT SUSP PLACE 2 SPRAYS INTO BOTH NOSTRILS 2 (TWO) TIMES DAILY. 23 g 5  . Eluxadoline (VIBERZI) 100 MG TABS Take 1 tablet by mouth 2 (two) times daily.     [name] EPINEPHrine 0.3 mg/0.3 mL IJ SOAJ injection Inject as directed.    . Fluticasone-Umeclidin-Vilant (TRELEGY ELLIPTA) 100-62.5-25 MCG/INH AEPB Inhale 1 puff into the lungs daily. 28 each 2  . furosemide (LASIX) 80 MG tablet TAKE 1 TO 1 & 1/2 TABLETS 2 X /DAY FOR FLUID RETENTION & SWELLING OF LEGS 270 tablet 1  . Galcanezumab-gnlm (EMGALITY) 120 MG/ML SOAJ Inject into the skin. Pt receives this monthly ingection    . GLUCOSAMINE-CHONDROITIN PO Take 3 tablets by mouth daily.    [name] guaiFENesin (MUCINEX) 600 MG 12 hr tablet Take 600  mg by mouth 2 (two) times daily as needed for cough or to loosen phlegm. Instructed to take 3 tablets in the morning    . KRILL OIL PO Take by mouth.    . lidocaine (LIDODERM) 5 % Place 1 patch onto the skin daily. Remove & Discard patch within 12 hours or as directed by MD 14 patch 0  . LUTEIN PO Take 1 tablet by mouth 2 (two) times daily.    [name]  LYRICA 150 MG capsule Take 2 capsules (300 mg total) by mouth 2 (two) times daily. 120 capsule 2  . memantine (NAMENDA) 10 MG tablet Take 10 mg by mouth daily.     . metFORMIN (GLUCOPHAGE-XR) 500 MG 24 hr tablet Take 2 tablets 2 x /day with Breakfast & Supper  for Diabetes 360 tablet 3  . metolazone (ZAROXOLYN) 5 MG tablet Take 1 tablet daily for Fluid Retention 90 tablet 1  . montelukast (SINGULAIR) 10 MG tablet TAKE 1 TABLET BY MOUTH EVERY DAY 90 tablet 3  . Multiple Vitamins-Minerals (MULTIVITAMIN WITH MINERALS) tablet Take 1 tablet by mouth daily.    . naloxone (NARCAN) nasal spray 4 mg/0.1 mL USE AS DIRECTED    . ondansetron (ZOFRAN-ODT) 4 MG disintegrating tablet TAKE 1 TABLET (4 MG TOTAL) BY MOUTH EVERY 6 (SIX) HOURS AS NEEDED FOR NAUSEA OR VOMITING. 30 tablet 0  . oxymetazoline (AFRIN) 0.05 % nasal spray Place 1 spray into both nostrils See admin instructions. 1 SPRAY PER SIDE EACH NIGHT BEFORE DYMISTA    . pentosan polysulfate (ELMIRON) 100 MG capsule Take 100 mg by mouth 3 (three) times daily before meals.     . potassium chloride SA (K-DUR,KLOR-CON) 20 MEQ tablet Take 1 tablet 2 x /day  For potassium 180 tablet 1  . pravastatin (PRAVACHOL) 40 MG tablet TAKE 1 TABLET BY MOUTH EVERYDAY AT BEDTIME (Patient taking differently: Take 20 mg by mouth daily. TAKE 1 TABLET BY MOUTH EVERYDAY AT BEDTIME) 90 tablet 1  . PRESCRIPTION MEDICATION Pt receives weekly allergy shots    . PROBIOTIC PRODUCT PO Take by mouth.    . sertraline (ZOLOFT) 50 MG tablet Take 50 mg by mouth 2 (two) times daily.     . sodium chloride (OCEAN) 0.65 % SOLN nasal spray Place 1  spray into both nostrils 4 (four) times daily as needed for congestion. Uses each time before other nasal sprays    . tadalafil (CIALIS) 5 MG tablet Take 5 mg by mouth every evening.     . triamcinolone cream (KENALOG) 0.1 % APPLY TOPICALLY 4 TIMES A DAY (Patient taking differently: APPLY TOPICALLY 2 TIMES A DAY AS NEEDED for itching) 30 g 1  . trospium (SANCTURA) 20 MG tablet Take 20 mg by mouth 2 (two) times daily.   2  . TURMERIC PO Take 3 capsules by mouth daily.    [name] desmopressin (DDAVP) 0.2 MG tablet Take 200 mcg by mouth daily.   3   Current Facility-Administered Medications  Medication Dose Route Frequency Provider Last Rate Last Dose  . Mepolizumab SOLR 100 mg  100 mg Subcutaneous Q28 days [name], MD   100 mg at 05/11/17 1546    Allergies:   Bee venom; Ppd [tuberculin purified protein derivative]; Fenofibrate; Levofloxacin; Other; Verapamil; Claritin [loratadine]; and Duloxetine    Social History:  The patient  reports that he has quit smoking. He has a 1.50 pack-year smoking history. He has never used smokeless tobacco. He reports current alcohol use of about 1.0 standard drinks of alcohol per week. He reports that he does not use drugs.   Family History:  The patient's family history includes Cancer in his father; Dementia in his mother; Diabetes in his maternal grandfather, maternal grandmother, paternal grandfather, paternal grandmother, and paternal uncle; Heart disease in his maternal grandfather; Prostate cancer in his maternal uncle.    ROS:  Please see the history of present illness.   Otherwise, review of systems are positive for none.  All other systems are reviewed and negative.    PHYSICAL EXAM: VS:  BP 122/80   Pulse 83   Ht 6' (1.829 m)   Wt (!) 385 lb 9.6 oz (174.9 kg)   BMI 52.30 kg/m  , BMI Body mass index is 52.3 kg/m. GEN: Well nourished, well developed, morbidly obese gentleman, sitting in chair in no acute distress HEENT: sclera  anicteric Neck: no JVD, carotid bruits, or masses Cardiac: RRR; no murmurs, rubs, or gallops, 1-2+ ankle edema  Respiratory:  clear to auscultation bilaterally, normal work of breathing GI: soft, obese, nontender, nondistended, + BS MS: no deformity or atrophy Skin: warm and dry, no rash Neuro:  Strength and sensation are intact Psych: euthymic mood, full affect   EKG:  EKG is ordered today. The ekg ordered today demonstrates sinus rhythm with incomplete RBBB, and LAE, no STE/D; no significant change from previous.    Recent Labs: 08/13/2017: Pro B Natriuretic peptide (BNP) 37.0 04/07/2018: ALT 28; BUN 12; Creat 0.83; Hemoglobin 16.4; Magnesium 1.7; Platelets 270; Potassium 4.4; Sodium 145; TSH 0.94    Lipid Panel    Component Value Date/Time   CHOL 131 04/07/2018 1726   TRIG 181 (H) 04/07/2018 1726   HDL 35 (L) 04/07/2018 1726   CHOLHDL 3.7 04/07/2018 1726   VLDL 45 (H) 10/02/2016 1727   LDLCALC 70 04/07/2018 1726      Wt Readings from Last 3 Encounters:  06/25/18 (!) 385 lb 9.6 oz (174.9 kg)  05/31/18 (!) 391 lb 3.2 oz (177.4 kg)  04/19/18 (!) 403 lb (182.8 kg)      Other studies Reviewed: Additional studies/ records that were reviewed today include:   Echocardiogram 02/2017: Study Conclusions  - Left ventricle: The cavity size was normal. Wall thickness was   normal. Systolic function was normal. The estimated ejection   fraction was in the range of 60% to 65%. Doppler parameters are   consistent with abnormal left ventricular relaxation (grade 1   diastolic dysfunction). - Left atrium: The atrium was mildly dilated. - Pericardium, extracardiac: A trivial pericardial effusion was   identified.    ASSESSMENT AND PLAN:  1. Preoperative assessment: patient is planned for C-spine surgery with Dr. [name]. He denies recent chest pain or changes in his pulmonary status in recent months. While he is limited in activity due to neck/back pain, he is able to complete  4 METs without anginal symptoms.  - Based on ACC/AHA guidelines, Mark Harrell would be at acceptable risk for the planned procedure without further cardiovascular testing.  - I will route this recommendation to the requesting party via [place] fax function   2. HTN: BP well controlled at 122/80 today - Continue ziac and lasix  3. LE edema: likely a combination of chronic diastolic CHF, obesity, and medications. He reports overall improvement in LE edema since his last visit with Dr. [name]. Still with 1-2+ ankle edema on exam today. He reports taking lasix 80mg  2x/week as he can only take the medication if he is not leaving his house that day.  - Encourage patient to try to take lasix 3x/week for improved ankle edema - Recommended compression stockings, particularly on non-lasix days - Recommended low sodium diet  4. OSA: he reported initially stopping CPAP use due to reflux issues. Now that his reflux is better controlled he has been trying to acquire a new mask so as to start using his CPAP machine again. - Encouraged CPAP compliance to improve cardiovascular health  5. Morbid obesity: Weight is down 25lbs over the past 6 months. He is trying to improve his diet and increase his activity level. Fall created a setback in exercising but he is hopeful this will improve after planned surgeries.  - Continue to encourage dietary and lifestyle modifications to promote weight loss.    Current medicines are reviewed at length with the patient today.  The patient does not have concerns regarding medicines.  The following changes have been made:  no change  Labs/ tests ordered today include:   Orders Placed This Encounter  Procedures  . EKG 12-Lead     Disposition:   FU with Dr. [name] in 6 months  Signed, Abigail Butts, PA-C  06/25/2018 4:24 PM

## 2018-06-25 NOTE — Patient Instructions (Signed)
Medication Instructions:  NOT NEEEDED If you need a refill on your cardiac medications before your next appointment, please call your pharmacy.   Lab work: NOT NEEDED If you have labs (blood work) drawn today and your tests are completely normal, you will receive your results only by: [name] MyChart Message (if you have MyChart) OR . A paper copy in the mail If you have any lab test that is abnormal or we need to change your treatment, we will call you to review the results.  Testing/Procedures: NOT NEEDED  Follow-Up: At [facility], you and your health needs are our priority.  As part of our continuing mission to provide you with exceptional heart care, we have created designated Provider Care Teams.  These Care Teams include your primary Cardiologist (physician) and Advanced Practice Providers (APPs -  Physician Assistants and Nurse Practitioners) who all work together to provide you with the care you need, when you need it. You will need a follow up appointment in 6 months July 2020.  Please call our office 2 months in advance to schedule this appointment.  You may see Quay Burow, MD or one of the following Advanced Practice Providers on your designated Care Team:   Kerin Ransom, PA-C Roby Lofts, [place] . Sande Rives, PA-C  Any Other Special Instructions Will Be Listed Below (If Applicable).  YOU ARE CLEARED FOR SURGERY A NOTE WILL BE SENT TO DR [name]

## 2018-07-06 DIAGNOSIS — R350 Frequency of micturition: Secondary | ICD-10-CM | POA: Diagnosis not present

## 2018-07-06 DIAGNOSIS — N401 Enlarged prostate with lower urinary tract symptoms: Secondary | ICD-10-CM | POA: Diagnosis not present

## 2018-07-06 DIAGNOSIS — E291 Testicular hypofunction: Secondary | ICD-10-CM | POA: Diagnosis not present

## 2018-07-06 DIAGNOSIS — N4883 Acquired buried penis: Secondary | ICD-10-CM | POA: Diagnosis not present

## 2018-07-11 ENCOUNTER — Encounter: Payer: Self-pay | Admitting: Internal Medicine

## 2018-07-11 NOTE — Progress Notes (Signed)
         N  O         S  H  O  W                                                                                                                                         This very nice 58 y.o.  Single WM with pernicious Morbid Obesity (BMI 55+)  presents for 6 month follow up with HTN, HLD, T2_NIDDM, Polypharmacy and Vitamin D Deficiency.   Patient has  allergic asthma & hx/o OSA (mask intolerant).     Other problems include TBI and SDAT and he is enamored  savoring  multiple treatments and is vunerable to overtreatment consequent of his exaggeration of sx's.     Patient is treated for HTN (2004)  & BP has been controlled at home. Today's  . Patient has had no complaints of any cardiac type chest pain, palpitations, dyspnea / orthopnea / PND, dizziness, claudication, or dependent edema.     Hyperlipidemia is controlled with diet & meds. Patient denies myalgias or other med SE's. Last Lipids were at goal with elevated Trig's: Lab Results  Component Value Date   CHOL 131 04/07/2018   HDL 35 (L) 04/07/2018   LDLCALC 70 04/07/2018   TRIG 181 (H) 04/07/2018   CHOLHDL 3.7 04/07/2018           Also, the patient has history of T2_NIDDM (2012) on Metformin and has had no symptoms of reactive hypoglycemia, diabetic polys, paresthesias or visual blurring.  Last A1c was not at goal: Lab Results  Component Value Date   HGBA1C 6.5 (H) 04/07/2018      Patient has hx/o Testosterone Deficiency ("96.66" / 2012 and "128" / 2013). Further, he also has history of Vitamin D Deficiency  ("27" / 2008)   and supplements vitamin D without any suspected side-effects. Last vitamin D was still low (goal 70-100): Lab Results  Component Value Date   VD25OH 43 04/07/2018

## 2018-07-12 ENCOUNTER — Ambulatory Visit: Payer: Self-pay | Admitting: Internal Medicine

## 2018-07-13 ENCOUNTER — Telehealth: Payer: Self-pay | Admitting: Adult Health

## 2018-07-13 NOTE — Telephone Encounter (Signed)
Received fax from [facility] requesting pre-op clearance for Anterior Cervical Fusion Last office 1.6.2020 with TP Last spiro/PFT was October 2019 On O2 with activity and at bedtime CPAP was restarted last visit  Per TP: would recommend patient come in for visit with any of the APPs for a more thorough evaluation.  Thank you.  LMOM TCB x1 to discuss with patient

## 2018-07-16 NOTE — Telephone Encounter (Signed)
lmtcb x2 for pt. 

## 2018-07-17 DIAGNOSIS — J9611 Chronic respiratory failure with hypoxia: Secondary | ICD-10-CM | POA: Diagnosis not present

## 2018-07-17 DIAGNOSIS — J449 Chronic obstructive pulmonary disease, unspecified: Secondary | ICD-10-CM | POA: Diagnosis not present

## 2018-07-19 NOTE — Telephone Encounter (Signed)
LMOM TCB x3 for patient [facility] to inform them that we have attempted to contact patient x3 with no success but they are closed  Will call back

## 2018-07-19 NOTE — Telephone Encounter (Signed)
[facility] x5 - they're phone lines are down As [facility] is within [facility], will route this message to [employer] who faxed the clearance form to let her know that we have been unable to reach patient to schedule appt for surgical clearance.  Letter mailed to patient's home Will sign off and await patient's call

## 2018-07-21 ENCOUNTER — Ambulatory Visit: Payer: BLUE CROSS/BLUE SHIELD | Admitting: Nurse Practitioner

## 2018-07-21 ENCOUNTER — Encounter: Payer: Self-pay | Admitting: Nurse Practitioner

## 2018-07-21 VITALS — BP 136/70 | HR 94 | Ht 72.0 in | Wt 381.4 lb

## 2018-07-21 DIAGNOSIS — J449 Chronic obstructive pulmonary disease, unspecified: Secondary | ICD-10-CM | POA: Diagnosis not present

## 2018-07-21 DIAGNOSIS — Z7189 Other specified counseling: Secondary | ICD-10-CM | POA: Diagnosis not present

## 2018-07-21 DIAGNOSIS — J9611 Chronic respiratory failure with hypoxia: Secondary | ICD-10-CM | POA: Diagnosis not present

## 2018-07-21 DIAGNOSIS — G4733 Obstructive sleep apnea (adult) (pediatric): Secondary | ICD-10-CM | POA: Diagnosis not present

## 2018-07-21 NOTE — Assessment & Plan Note (Signed)
Presents today for preop clearance for anterior cervical fusion.  He has been doing well overall.  He continues to wear oxygen at 2 L with activity and at bedtime.  He has started back on his CPAP.  Patient Instructions  Will send letter with surgical clearance recommendations to orthopedics Continue on TRELEGY 1 puff daily . Rinse after use.  Continue on Singulair and Zyrtec daily .  Continue on Oxygen 2l/m with activity and  at bedtime   Glad you have started back on CPAP Try to wear for at least 4hr each night.  Work on weight loss.  Do not drive if sleepy .  Continue on nasal sprays.  Follow up Dr. [name] in 3-4 months and As needed   Please contact office for sooner follow up if symptoms do not improve or worsen or seek emergency care

## 2018-07-21 NOTE — Assessment & Plan Note (Addendum)
Continue O2 at 2 L with activity and at night.

## 2018-07-21 NOTE — Patient Instructions (Signed)
Will send letter with surgical clearance recommendations to orthopedics Continue on TRELEGY 1 puff daily . Rinse after use.  Continue on Singulair and Zyrtec daily .  Continue on Oxygen 2l/m with activity and at bedtime   Glad you have started back on CPAP Try to wear for at least 4hr each night.  Work on weight loss.  Do not drive if sleepy .  Continue on nasal sprays.  Follow up Dr. [name] in 3-4 months and As needed   Please contact office for sooner follow up if symptoms do not improve or worsen or seek emergency care

## 2018-07-21 NOTE — Progress Notes (Signed)
@Patient  ID: Mark Harrell, male    DOB: 08-08-1960, 59 y.o.   MRN: [id number]  Chief Complaint  Patient presents with  . Follow-up    pre-op clearance    Referring provider: [name], MD  HPI 58 year old male former smoker followed for asthma, COPD, obstructive sleep apnea, and oxygen dependent respiratory failure is followed by Dr. [name]. PMH includes chronic sinusitis followed by ENT, previous sinus surgery, memory loss (aricept and namenda)   Tests: FENO 12/28/2017-10  PFTs 12/28/2017 FVC 3.41 [65%), FEV1 2.69 [67%], F/F 79, TLC 79%, DLCO 94% Minimal obstruction, mild restriction  03/09/17: Walked 2 laps / Baseline Sat 97% on RA / Nadir Sat 97% on RA (walked at a slow pace with a cane - stopped due to dyspnea) 05/08/16: Walked 321 meters / Baseline Sat 93% on RA / Nadir Sat 93% on RA @ rest  SPLIT NIGHT SLEEP STUDY (05/07/16): Severe obstructive sleep apnea with AHI 65.7 events/hour. No significant central sleep apnea occurred. Severe desaturation during diagnostic portion to 72% on room air. No cardiac abnormalities or periodic limb movements were noted. Optimal CPAP pressure was 18 cm H2O with a large-sized [employer] Full Face Mask Simplus mask. Reading physician recommended AutoPap at 10-18 cm H2O pressure.  CT chest 01/04/2017-no PE, lungs are clear. Chest x-ray 12/08/2017-no active cardiopulmonary disease. I reviewed the images personally.  ESOPHAGRAM/BARIUM SWALLOW 04/15/16 (per radiologist):Mild nonspecific esophageal dysmotility disorder. Episode of flash laryngeal penetration with swallowing. Small sliding type I hiatal hernia. No visible mass, erosion, or ulcerations.  MICROBIOLOGY Sputum Culture 02/16/17: Normal Flora   LABS 04/10/16 Alpha-1 antitrypsin: MM (135)  03/28/16 CBC: 11.8/14.2/43.0/309 Eosinophils: 354 IgG: 583 IgA: 154 IgM: 46 IgE: 92   11/02/13 ANA: Negative DS DNA Ab: <1 SSA: <1.0 SSB: <1.0  02/24/11 ACE:  35 RF: <10  Previously on Nucala in 2018 with no perceived benefit Dupixent started January 2019 followed by allergy and immunology  Personal/social history: No pets, no known occupational exposures, no hot tubs, no basement, never smoker. Previously lived in [place], [place], [place], [place] Self employed.   OV 07/21/18 - surgical clearance Presents today for surgical clearance on upcoming anterior cervical fusion by Dr. [name].  He has been cleared by cardiology.  Patient has been doing well.  He is still on oxygen at 2 L with exertion and at night.  He has started back using his CPAP.  States that he is tolerating it well.  He is needing clearance today for upcoming surgery with Dr. [name] for a cervical fusion after a fall in October 2019 which resulted in neck and back pain.  Patient has been doing well with his diet and has lost 25 pounds in the last 6 months.  She is compliant with Trelegy and Singulair daily.  He is also on Dupixent.  No increased Saba use.  He does get winded with heavy activity and cannot walk long distances.  Denies any recent flare of cough or wheezing.  Increased Saba use.  Denies f/c/s, n/v/d, hemoptysis, PND, leg swelling.        Allergies  Allergen Reactions  . Bee Venom Swelling  . Ppd [Tuberculin Purified Protein Derivative] Other (See Comments)    +ppd NEG Quantferron Gold 3/13  . Fenofibrate Other (See Comments)    Back pain  . Levofloxacin Diarrhea  . Other Other (See Comments)    Some antibiotics cause diarrhea  . Verapamil Other (See Comments)    Back pain  . Claritin [Loratadine] Other (See  Comments)    unknown  . Duloxetine     Brought on asthma    Immunization History  Administered Date(s) Administered  . Influenza,inj,Quad PF,6+ Mos 03/04/2016, 03/09/2017  . Influenza-Unspecified 02/24/2015, 05/19/2018  . PPD Test 08/06/2011  . Pneumococcal Conjugate-13 11/02/2014  . Pneumococcal Polysaccharide-23 04/10/2016  .  Pneumococcal-Unspecified 05/26/2004  . Td 05/26/2000  . Tdap 08/01/2013  . Zoster 05/26/2009  . Zoster Recombinat (Shingrix) 11/05/2016    Past Medical History:  Diagnosis Date  . Anesthesia complication requiring reversal agent administration    ? from central apnea, very difficult to get off vent  . Anxiety   . Arthritis    osteo  . Asthma   . BPH (benign prostatic hyperplasia)   . Complication of anesthesia    difficulty waking , they twlight me because of my respiratory problems "  . Dyspnea   . Enlarged heart   . Family history of adverse reaction to anesthesia    mother trouble waking up, and heart stopped  . GERD (gastroesophageal reflux disease)   . Headache    botox injections for headaches  . Hyperlipidemia   . Hypertension   . Hypogonadism male   . IBS (irritable bowel syndrome)   . Obesity   . OSA (obstructive sleep apnea)    cpap  . Pneumonia   . Pre-diabetes   . Prostatitis     Tobacco History: Social History   Tobacco Use  Smoking Status Former Smoker  . Packs/day: 0.10  . Years: 15.00  . Pack years: 1.50  Smokeless Tobacco Never Used  Tobacco Comment   significant second-hand exposure through mother   Counseling given: Yes Comment: significant second-hand exposure through mother   Outpatient Encounter Medications as of 07/21/2018  Medication Sig  . albuterol (PROVENTIL HFA;VENTOLIN HFA) 108 (90 Base) MCG/ACT inhaler 1 to 2 inhalations 10 to 15 minutes apart every 4 hrs to rescue Asthma  . albuterol (PROVENTIL) (2.5 MG/3ML) 0.083% nebulizer solution Inhale 3 mLs (2.5 mg total) into the lungs every 4 (four) hours as needed.  [name] alfuzosin (UROXATRAL) 10 MG 24 hr tablet Take 10 mg by mouth daily.    [name] anastrozole (ARIMIDEX) 1 MG tablet daily.  . baclofen (LIORESAL) 10 MG tablet Take 10 mg by mouth 3 (three) times daily as needed for muscle spasms.   . bisoprolol-hydrochlorothiazide (ZIAC) 5-6.25 MG tablet TAKE 1 TABLET BY MOUTH EVERY DAY  .  Botulinum Toxin Type A 200 units SOLR Inject into the skin.  . Calcium Carbonate-Vitamin D (CALCIUM + D PO) Take 500 mg by mouth 3 (three) times daily.   . cetaphil (CETAPHIL) lotion Apply topically 2 (two) times daily.  . cetirizine (ZYRTEC) 10 MG tablet Take 10 mg by mouth daily.  . Cholecalciferol (VITAMIN D PO) Take 10,000-20,000 Units by mouth 2 (two) times daily. 20000 in the morning and 10000 in the evening  . Cinnamon 500 MG capsule Take by mouth.  . clotrimazole-betamethasone (LOTRISONE) cream APPLY TO AFFECTED AREA TWICE A DAY  . co-enzyme Q-10 30 MG capsule Take 30 mg by mouth daily.  . cyclobenzaprine (FLEXERIL) 10 MG tablet Take 10 mg by mouth 3 (three) times daily.   [name] desmopressin (DDAVP) 0.2 MG tablet Take 200 mcg by mouth daily.   . diclofenac (VOLTAREN) 75 MG EC tablet TAKE 1 TABLET TWICE DAILY AS NEEDED FOR PAIN  . diclofenac sodium (VOLTAREN) 1 % GEL APPLY 4 GRAMS TOPICALLY 4 TIMES DAILY  . donepezil (ARICEPT) 23 MG TABS tablet  Take 23 mg by mouth at bedtime.  [name] doxycycline (VIBRAMYCIN) 100 MG capsule Take 100 mg by mouth 2 (two) times daily.  . Dupilumab, Asthma, (DUPIXENT) 200 MG/1.14ML SOSY Inject into the skin. Injects bi-weekly  . DYMISTA 137-50 MCG/ACT SUSP PLACE 2 SPRAYS INTO BOTH NOSTRILS 2 (TWO) TIMES DAILY.  [name] Eluxadoline (VIBERZI) 100 MG TABS Take 1 tablet by mouth 2 (two) times daily.   [name] EPINEPHrine 0.3 mg/0.3 mL IJ SOAJ injection Inject as directed.  . Fluticasone-Umeclidin-Vilant (TRELEGY ELLIPTA) 100-62.5-25 MCG/INH AEPB Inhale 1 puff into the lungs daily.  . furosemide (LASIX) 80 MG tablet TAKE 1 TO 1 & 1/2 TABLETS 2 X /DAY FOR FLUID RETENTION & SWELLING OF LEGS  . Galcanezumab-gnlm (EMGALITY) 120 MG/ML SOAJ Inject into the skin. Pt receives this monthly ingection  . GLUCOSAMINE-CHONDROITIN PO Take 3 tablets by mouth daily.  [name] guaiFENesin (MUCINEX) 600 MG 12 hr tablet Take 600 mg by mouth 2 (two) times daily as needed for cough or to loosen phlegm.  Instructed to take 3 tablets in the morning  . KRILL OIL PO Take by mouth.  . lidocaine (LIDODERM) 5 % Place 1 patch onto the skin daily. Remove & Discard patch within 12 hours or as directed by MD  . LUTEIN PO Take 1 tablet by mouth 2 (two) times daily.  [name] LYRICA 150 MG capsule Take 2 capsules (300 mg total) by mouth 2 (two) times daily.  . memantine (NAMENDA) 10 MG tablet Take 10 mg by mouth daily.   . metFORMIN (GLUCOPHAGE-XR) 500 MG 24 hr tablet Take 2 tablets 2 x /day with Breakfast & Supper  for Diabetes  . metolazone (ZAROXOLYN) 5 MG tablet Take 1 tablet daily for Fluid Retention  . montelukast (SINGULAIR) 10 MG tablet TAKE 1 TABLET BY MOUTH EVERY DAY  . Multiple Vitamins-Minerals (MULTIVITAMIN WITH MINERALS) tablet Take 1 tablet by mouth daily.  . naloxone (NARCAN) nasal spray 4 mg/0.1 mL USE AS DIRECTED  . ondansetron (ZOFRAN-ODT) 4 MG disintegrating tablet TAKE 1 TABLET (4 MG TOTAL) BY MOUTH EVERY 6 (SIX) HOURS AS NEEDED FOR NAUSEA OR VOMITING.  [name] oxymetazoline (AFRIN) 0.05 % nasal spray Place 1 spray into both nostrils See admin instructions. 1 SPRAY PER SIDE EACH NIGHT BEFORE DYMISTA  . pentosan polysulfate (ELMIRON) 100 MG capsule Take 100 mg by mouth 3 (three) times daily before meals.   . potassium chloride SA (K-DUR,KLOR-CON) 20 MEQ tablet Take 1 tablet 2 x /day  For potassium  . pravastatin (PRAVACHOL) 40 MG tablet TAKE 1 TABLET BY MOUTH EVERYDAY AT BEDTIME (Patient taking differently: Take 20 mg by mouth daily. TAKE 1 TABLET BY MOUTH EVERYDAY AT BEDTIME)  . PRESCRIPTION MEDICATION Pt receives weekly allergy shots  . PROBIOTIC PRODUCT PO Take by mouth.  . sertraline (ZOLOFT) 50 MG tablet Take 50 mg by mouth 2 (two) times daily.   . sodium chloride (OCEAN) 0.65 % SOLN nasal spray Place 1 spray into both nostrils 4 (four) times daily as needed for congestion. Uses each time before other nasal sprays  . tadalafil (CIALIS) 5 MG tablet Take 5 mg by mouth every evening.   .  triamcinolone cream (KENALOG) 0.1 % APPLY TOPICALLY 4 TIMES A DAY (Patient taking differently: APPLY TOPICALLY 2 TIMES A DAY AS NEEDED for itching)  . trospium (SANCTURA) 20 MG tablet Take 20 mg by mouth 2 (two) times daily.   . TURMERIC PO Take 3 capsules by mouth daily.   Facility-Administered Encounter Medications as of 07/21/2018  Medication  . Mepolizumab SOLR 100 mg     Review of Systems  Review of Systems  Constitutional: Negative.  Negative for chills and fever.  HENT: Negative.   Respiratory: Negative for cough, shortness of breath and wheezing.   Cardiovascular: Negative.  Negative for chest pain, palpitations and leg swelling.  Gastrointestinal: Negative.   Allergic/Immunologic: Negative.   Neurological: Negative.   Psychiatric/Behavioral: Negative.        Physical Exam  BP 136/70 (BP Location: Left Arm, Patient Position: Sitting, Cuff Size: Normal)   Pulse 94   Ht 6' (1.829 m)   Wt (!) 381 lb 6.4 oz (173 kg)   SpO2 96%   BMI 51.73 kg/m   Wt Readings from Last 5 Encounters:  07/21/18 (!) 381 lb 6.4 oz (173 kg)  06/25/18 (!) 385 lb 9.6 oz (174.9 kg)  05/31/18 (!) 391 lb 3.2 oz (177.4 kg)  04/19/18 (!) 403 lb (182.8 kg)  04/12/18 (!) 403 lb 3.2 oz (182.9 kg)     Physical Exam Vitals signs and nursing note reviewed.  Constitutional:      General: He is not in acute distress.    Appearance: He is well-developed.  Cardiovascular:     Rate and Rhythm: Normal rate and regular rhythm.  Pulmonary:     Effort: Pulmonary effort is normal. No respiratory distress.     Breath sounds: Normal breath sounds. No wheezing or rhonchi.  Skin:    General: Skin is warm and dry.  Neurological:     Mental Status: He is alert and oriented to person, place, and time.       Assessment & Plan:   Surgical counseling visit  OSA and COPD overlap syndrome ([place])  Chronic respiratory failure with hypoxia ([facility])   OSA and COPD overlap syndrome ([place]) Presents today for  preop clearance for anterior cervical fusion.  He has been doing well overall.  He continues to wear oxygen at 2 L with activity and at bedtime.  He has started back on his CPAP.  Patient Instructions  Will send letter with surgical clearance recommendations to orthopedics Continue on TRELEGY 1 puff daily . Rinse after use.  Continue on Singulair and Zyrtec daily .  Continue on Oxygen 2l/m with activity and  at bedtime   Glad you have started back on CPAP Try to wear for at least 4hr each night.  Work on weight loss.  Do not drive if sleepy .  Continue on nasal sprays.  Follow up Dr. [name] in 3-4 months and As needed   Please contact office for sooner follow up if symptoms do not improve or worsen or seek emergency care     Chronic respiratory failure with hypoxia ([place]) Continue O2 at 2 L with activity and at night.  Patient is at moderate risk from pulmonary standpoint based on risk stratification scores as follows:  [place] Postperative Pulmonary Risk Score - for mech ventilation dependence >3-5 days Comment Score  Type of surgery - abd ao aneurysm (27), thoracic (21), neurosurgery / upper abdominal / vascular (21), neck (11)  21  Emergency Surgery - (11)  0  ALbumin < 3 or poor nutritional state - (9)  0  BUN > 30 -  (8)  0  Partial or completely dependent functional status - (7)  0  COPD -  (6)  6  Age - 60 to 69 (4), > 70  (6)  0  TOTAL    Risk Stratifcation scores  - <  10, 11-19, 20-27, 28-40, >40  21     CANET Postperative Pulmonary Risk Score -for any pulm complication Comment Score  Age - <50 (0), 50-80 (3), >80 (16)  3  Preoperative pulse ox - >96 (0), 91-95 (8), <90 (24)  0  Respiratory infection in last month - Yes (17)  0  Preoperative anemia - < 10gm% - Yes (11)  0  Surgical incision - Upper abdominal (15), Thoracic (24)  0  Duration of surgery - <2h (0), 2-3h (16), >3h (23)  0  Emergency Surgery - Yes (8)  0  TOTAL    Risk Stratification - Low (<26),  Intermediate (26-44), High (>45)  3    Major Pulmonary risks identified in the multifactorial risk analysis are but not limited to a) pneumonia; b) recurrent intubation risk; c) prolonged or recurrent acute respiratory failure needing mechanical ventilation; d) prolonged hospitalization; e) DVT/Pulmonary embolism; f) Acute Pulmonary edema  Recommend 1. Short duration of surgery as much as possible and avoid paralytic if possible 2. Recovery in step down or ICU with Pulmonary consultation if indicated 3. DVT prophylaxis 4. Aggressive pulmonary toilet with O2, bronchodilatation, and incentive spirometry and early ambulation    Fenton Foy, NP 07/22/2018

## 2018-07-22 ENCOUNTER — Encounter: Payer: Self-pay | Admitting: Nurse Practitioner

## 2018-07-26 ENCOUNTER — Encounter: Payer: Self-pay | Admitting: Internal Medicine

## 2018-07-26 NOTE — Patient Instructions (Signed)
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
## 2018-07-27 ENCOUNTER — Ambulatory Visit (INDEPENDENT_AMBULATORY_CARE_PROVIDER_SITE_OTHER): Payer: BLUE CROSS/BLUE SHIELD | Admitting: Internal Medicine

## 2018-07-27 VITALS — BP 117/68 | HR 76 | Temp 97.6°F | Resp 20 | Ht 72.0 in | Wt 374.0 lb

## 2018-07-27 DIAGNOSIS — Z6841 Body Mass Index (BMI) 40.0 and over, adult: Secondary | ICD-10-CM

## 2018-07-27 DIAGNOSIS — E782 Mixed hyperlipidemia: Secondary | ICD-10-CM | POA: Diagnosis not present

## 2018-07-27 DIAGNOSIS — J449 Chronic obstructive pulmonary disease, unspecified: Secondary | ICD-10-CM

## 2018-07-27 DIAGNOSIS — E119 Type 2 diabetes mellitus without complications: Secondary | ICD-10-CM | POA: Diagnosis not present

## 2018-07-27 DIAGNOSIS — I10 Essential (primary) hypertension: Secondary | ICD-10-CM

## 2018-07-27 DIAGNOSIS — E559 Vitamin D deficiency, unspecified: Secondary | ICD-10-CM

## 2018-07-27 DIAGNOSIS — Z79899 Other long term (current) drug therapy: Secondary | ICD-10-CM

## 2018-07-28 LAB — COMPLETE METABOLIC PANEL WITH GFR
AG Ratio: 2 (calc) (ref 1.0–2.5)
ALBUMIN MSPROF: 4.1 g/dL (ref 3.6–5.1)
ALT: 73 U/L — AB (ref 9–46)
AST: 46 U/L — ABNORMAL HIGH (ref 10–35)
Alkaline phosphatase (APISO): 55 U/L (ref 35–144)
BUN: 14 mg/dL (ref 7–25)
CALCIUM: 9.7 mg/dL (ref 8.6–10.3)
CO2: 27 mmol/L (ref 20–32)
Chloride: 100 mmol/L (ref 98–110)
Creat: 1 mg/dL (ref 0.70–1.33)
GFR, EST AFRICAN AMERICAN: 96 mL/min/{1.73_m2} (ref >=60)
GFR, EST NON AFRICAN AMERICAN: 83 mL/min/{1.73_m2} (ref >=60)
GLOBULIN: 2.1 g/dL (ref 1.9–3.7)
Glucose, Bld: 79 mg/dL (ref 65–99)
POTASSIUM: 4.2 mmol/L (ref 3.5–5.3)
Sodium: 139 mmol/L (ref 135–146)
TOTAL PROTEIN: 6.2 g/dL (ref 6.1–8.1)
Total Bilirubin: 1.4 mg/dL — ABNORMAL HIGH (ref 0.2–1.2)

## 2018-07-28 LAB — CBC WITH DIFFERENTIAL/PLATELET
ABSOLUTE MONOCYTES: 922 {cells}/uL (ref 200–950)
BASOS PCT: 0.8 %
Basophils Absolute: 70 cells/uL (ref 0–200)
Eosinophils Absolute: 191 cells/uL (ref 15–500)
Eosinophils Relative: 2.2 %
HEMATOCRIT: 46.2 % (ref 38.5–50.0)
Hemoglobin: 15.7 g/dL (ref 13.2–17.1)
LYMPHS ABS: 3176 {cells}/uL (ref 850–3900)
MCH: 30 pg (ref 27.0–33.0)
MCHC: 34 g/dL (ref 32.0–36.0)
MCV: 88.2 fL (ref 80.0–100.0)
MPV: 11.5 fL (ref 7.5–12.5)
Monocytes Relative: 10.6 %
Neutro Abs: 4341 cells/uL (ref 1500–7800)
Neutrophils Relative %: 49.9 %
Platelets: 241 10*3/uL (ref 140–400)
RBC: 5.24 10*6/uL (ref 4.20–5.80)
RDW: 12.5 % (ref 11.0–15.0)
Total Lymphocyte: 36.5 %
WBC: 8.7 10*3/uL (ref 3.8–10.8)

## 2018-07-28 LAB — VITAMIN D 25 HYDROXY (VIT D DEFICIENCY, FRACTURES): VIT D 25 HYDROXY: 57 ng/mL (ref 30–100)

## 2018-07-28 LAB — LIPID PANEL
CHOL/HDL RATIO: 5.2 (calc) — AB (ref <5.0)
Cholesterol: 141 mg/dL (ref <200)
HDL: 27 mg/dL — AB (ref >=40)
LDL Cholesterol (Calc): 74 mg/dL (calc)
NON-HDL CHOLESTEROL (CALC): 114 mg/dL (ref <130)
TRIGLYCERIDES: 334 mg/dL — AB (ref <150)

## 2018-07-28 LAB — INSULIN, RANDOM: Insulin: 6.3 u[IU]/mL

## 2018-07-28 LAB — HEMOGLOBIN A1C
Hgb A1c MFr Bld: 5.4 % of total Hgb (ref <5.7)
Mean Plasma Glucose: 108 (calc)
eAG (mmol/L): 6 (calc)

## 2018-07-28 LAB — TSH: TSH: 2.42 m[IU]/L (ref 0.40–4.50)

## 2018-07-28 LAB — MAGNESIUM: Magnesium: 1.6 mg/dL (ref 1.5–2.5)

## 2018-08-01 ENCOUNTER — Encounter: Payer: Self-pay | Admitting: Internal Medicine

## 2018-08-02 DIAGNOSIS — M5416 Radiculopathy, lumbar region: Secondary | ICD-10-CM | POA: Diagnosis not present

## 2018-08-02 DIAGNOSIS — M4722 Other spondylosis with radiculopathy, cervical region: Secondary | ICD-10-CM | POA: Diagnosis not present

## 2018-08-02 DIAGNOSIS — M7918 Myalgia, other site: Secondary | ICD-10-CM | POA: Diagnosis not present

## 2018-08-13 ENCOUNTER — Other Ambulatory Visit: Payer: Self-pay | Admitting: Internal Medicine

## 2018-08-13 MED ORDER — PANTOPRAZOLE SODIUM 40 MG PO TBEC: DELAYED_RELEASE_TABLET | ORAL | 3 refills | Status: DC

## 2018-08-15 DIAGNOSIS — J9611 Chronic respiratory failure with hypoxia: Secondary | ICD-10-CM | POA: Diagnosis not present

## 2018-08-15 DIAGNOSIS — J449 Chronic obstructive pulmonary disease, unspecified: Secondary | ICD-10-CM | POA: Diagnosis not present

## 2018-08-19 DIAGNOSIS — R069 Unspecified abnormalities of breathing: Secondary | ICD-10-CM | POA: Diagnosis not present

## 2018-08-19 DIAGNOSIS — G4733 Obstructive sleep apnea (adult) (pediatric): Secondary | ICD-10-CM | POA: Diagnosis not present

## 2018-08-19 DIAGNOSIS — J449 Chronic obstructive pulmonary disease, unspecified: Secondary | ICD-10-CM | POA: Diagnosis not present

## 2018-08-20 DIAGNOSIS — G43709 Chronic migraine without aura, not intractable, without status migrainosus: Secondary | ICD-10-CM | POA: Diagnosis not present

## 2018-08-23 ENCOUNTER — Other Ambulatory Visit: Payer: Self-pay | Admitting: Internal Medicine

## 2018-08-31 ENCOUNTER — Ambulatory Visit: Payer: BLUE CROSS/BLUE SHIELD | Admitting: Podiatry

## 2018-08-31 ENCOUNTER — Other Ambulatory Visit: Payer: Self-pay

## 2018-08-31 ENCOUNTER — Encounter: Payer: Self-pay | Admitting: Podiatry

## 2018-08-31 VITALS — Temp 97.5°F

## 2018-08-31 DIAGNOSIS — M722 Plantar fascial fibromatosis: Secondary | ICD-10-CM | POA: Diagnosis not present

## 2018-08-31 DIAGNOSIS — M79676 Pain in unspecified toe(s): Secondary | ICD-10-CM

## 2018-08-31 DIAGNOSIS — B351 Tinea unguium: Secondary | ICD-10-CM | POA: Diagnosis not present

## 2018-08-31 NOTE — Progress Notes (Signed)
He presents today chief complaint of painfully elongated toenails of bilateral painful heels.  Objective: Vital signs are stable alert and oriented x3.  There is no erythema edema cellulitis drainage or odor.  Has pain on palpation medial [place] tubercles toenails are long thick yellow dystrophic-like mycotic and painful palpation.  Assessment: Pain limb secondary to onychomycosis.  Plantar fasciitis bilateral.  Plan: Injected bilateral heels today 20 mg Kenalog 5 mg Marcaine after sterile Betadine skin prep tolerated procedure well.  Debrided toenails 1 through 5 bilateral.

## 2018-09-02 ENCOUNTER — Ambulatory Visit: Payer: BLUE CROSS/BLUE SHIELD | Admitting: Podiatry

## 2018-09-08 DIAGNOSIS — G43709 Chronic migraine without aura, not intractable, without status migrainosus: Secondary | ICD-10-CM | POA: Diagnosis not present

## 2018-09-15 ENCOUNTER — Other Ambulatory Visit: Payer: Self-pay | Admitting: *Deleted

## 2018-09-15 DIAGNOSIS — J449 Chronic obstructive pulmonary disease, unspecified: Secondary | ICD-10-CM | POA: Diagnosis not present

## 2018-09-15 DIAGNOSIS — J9611 Chronic respiratory failure with hypoxia: Secondary | ICD-10-CM | POA: Diagnosis not present

## 2018-09-15 MED ORDER — DYMISTA 137-50 MCG/ACT NA SUSP: NASAL | 0 refills | Status: DC

## 2018-09-19 DIAGNOSIS — J449 Chronic obstructive pulmonary disease, unspecified: Secondary | ICD-10-CM | POA: Diagnosis not present

## 2018-09-19 DIAGNOSIS — R069 Unspecified abnormalities of breathing: Secondary | ICD-10-CM | POA: Diagnosis not present

## 2018-09-19 DIAGNOSIS — G4733 Obstructive sleep apnea (adult) (pediatric): Secondary | ICD-10-CM | POA: Diagnosis not present

## 2018-09-20 DIAGNOSIS — E559 Vitamin D deficiency, unspecified: Secondary | ICD-10-CM | POA: Diagnosis not present

## 2018-09-20 DIAGNOSIS — R609 Edema, unspecified: Secondary | ICD-10-CM | POA: Diagnosis not present

## 2018-09-20 DIAGNOSIS — I129 Hypertensive chronic kidney disease with stage 1 through stage 4 chronic kidney disease, or unspecified chronic kidney disease: Secondary | ICD-10-CM | POA: Diagnosis not present

## 2018-09-20 DIAGNOSIS — R809 Proteinuria, unspecified: Secondary | ICD-10-CM | POA: Diagnosis not present

## 2018-09-23 ENCOUNTER — Ambulatory Visit: Payer: BLUE CROSS/BLUE SHIELD | Admitting: Pulmonary Disease

## 2018-10-11 ENCOUNTER — Other Ambulatory Visit: Payer: Self-pay | Admitting: Internal Medicine

## 2018-10-11 DIAGNOSIS — E876 Hypokalemia: Secondary | ICD-10-CM

## 2018-10-15 DIAGNOSIS — J449 Chronic obstructive pulmonary disease, unspecified: Secondary | ICD-10-CM | POA: Diagnosis not present

## 2018-10-15 DIAGNOSIS — J9611 Chronic respiratory failure with hypoxia: Secondary | ICD-10-CM | POA: Diagnosis not present

## 2018-10-19 ENCOUNTER — Ambulatory Visit: Payer: BLUE CROSS/BLUE SHIELD | Admitting: Adult Health

## 2018-10-19 ENCOUNTER — Encounter: Payer: Self-pay | Admitting: Adult Health

## 2018-10-19 ENCOUNTER — Other Ambulatory Visit: Payer: Self-pay

## 2018-10-19 VITALS — BP 132/80 | Temp 97.8°F | Wt 357.0 lb

## 2018-10-19 DIAGNOSIS — J449 Chronic obstructive pulmonary disease, unspecified: Secondary | ICD-10-CM | POA: Diagnosis not present

## 2018-10-19 DIAGNOSIS — G4733 Obstructive sleep apnea (adult) (pediatric): Secondary | ICD-10-CM | POA: Diagnosis not present

## 2018-10-19 DIAGNOSIS — R069 Unspecified abnormalities of breathing: Secondary | ICD-10-CM | POA: Diagnosis not present

## 2018-10-19 MED ORDER — PREDNISONE 20 MG PO TABS: ORAL_TABLET | ORAL | 0 refills | Status: DC

## 2018-10-19 MED ORDER — PROMETHAZINE-DM 6.25-15 MG/5ML PO SYRP: 5.0000 mL | ORAL_SOLUTION | Freq: Four times a day (QID) | ORAL | 1 refills | Status: DC | PRN

## 2018-10-19 NOTE — Progress Notes (Signed)
Virtual Visit via Telephone Note  I connected with Mark Harrell on 10/19/18 at  3:00 PM EDT by telephone and verified that I am speaking with the correct person using two identifiers.  Location: Patient: home  Provider: [place] office   I discussed the limitations, risks, security and privacy concerns of performing an evaluation and management service by telephone and the availability of in person appointments. I also discussed with the patient that there may be a patient responsible charge related to this service. The patient expressed understanding and agreed to proceed.    History of Present Illness:  BP 132/80   Temp 97.8 F (36.6 C)   Wt (!) 357 lb (161.9 kg)   BMI 48.42 kg/m   58 y.o. male with morbid obesity, asthma/COPD overlap syndrome, season allergies, OSA, polypharmacy calls to report increasing secretions and cough since Friday evening 4 days ago, has frequent flares in the past that improve with steroid taper. Cough is periodic, worse if he lies down and then is awoken, coughing up thick secretions.    He has been using nebulizer every 4 hours which seems to have improved symptoms significantly but not resolving.   Denies wheezing, chest achiness, fever/chills. Dyspnea consistent with baseline.   He is taking mucinex 600 mg 12 hour BID, zyrtec , taking trellegy daily,   He is on doxycycline 100 mg BID daily for prophylasix vis allergy/asthma Dr.   He has been checking temp, has been typical for him, BPs, weights are stable/down, reports edema in legs is stable from previous, weights are trending down.   He does not feel this is infectious, in the past report has recovered well with sterid taper.   He has been using dylsym which hasn't been very helpful, reports has done well with promethazine DM in the past.  No recent travel, no known sick contacts. He has been staying at home since covid 19 as much as possible.   BMI is Body mass index is 48.42 kg/m. Wt Readings  from Last 3 Encounters:  10/19/18 (!) 357 lb (161.9 kg)  07/27/18 (!) 374 lb (169.6 kg)  07/21/18 (!) 381 lb 6.4 oz (173 kg)     Past Medical History:  Diagnosis Date  . Anesthesia complication requiring reversal agent administration    ? from central apnea, very difficult to get off vent  . Anxiety   . Arthritis    osteo  . Asthma   . BPH (benign prostatic hyperplasia)   . Complication of anesthesia    difficulty waking , they twlight me because of my respiratory problems "  . Dyspnea   . Enlarged heart   . Family history of adverse reaction to anesthesia    mother trouble waking up, and heart stopped  . GERD (gastroesophageal reflux disease)   . Headache    botox injections for headaches  . Hyperlipidemia   . Hypertension   . Hypogonadism male   . IBS (irritable bowel syndrome)   . Obesity   . OSA (obstructive sleep apnea)    cpap  . Pneumonia   . Pre-diabetes   . Prostatitis      Current Outpatient Medications:  .  albuterol (PROVENTIL HFA;VENTOLIN HFA) 108 (90 Base) MCG/ACT inhaler, 1 to 2 inhalations 10 to 15 minutes apart every 4 hrs to rescue Asthma, Disp: 60 g, Rfl: 1 .  albuterol (PROVENTIL) (2.5 MG/3ML) 0.083% nebulizer solution, Inhale 3 mLs (2.5 mg total) into the lungs every 4 (four) hours as needed.,  Disp: 75 mL, Rfl: 12 .  alfuzosin (UROXATRAL) 10 MG 24 hr tablet, Take 10 mg by mouth daily.  , Disp: , Rfl:  .  anastrozole (ARIMIDEX) 1 MG tablet, daily., Disp: , Rfl:  .  baclofen (LIORESAL) 10 MG tablet, Take 10 mg by mouth 3 (three) times daily as needed for muscle spasms. , Disp: , Rfl:  .  bisoprolol-hydrochlorothiazide (ZIAC) 5-6.25 MG tablet, TAKE 1 TABLET BY MOUTH EVERY DAY, Disp: 90 tablet, Rfl: 1 .  Botulinum Toxin Type A 200 units SOLR, Inject into the skin., Disp: , Rfl:  .  Calcium Carbonate-Vitamin D (CALCIUM + D PO), Take 500 mg by mouth 3 (three) times daily. , Disp: , Rfl:  .  cetaphil (CETAPHIL) lotion, Apply topically 2 (two) times daily.,  Disp: 236 mL, Rfl: 0 .  cetirizine (ZYRTEC) 10 MG tablet, Take 10 mg by mouth daily., Disp: , Rfl:  .  Cholecalciferol (VITAMIN D PO), Take 10,000-20,000 Units by mouth 2 (two) times daily. 20000 in the morning and 10000 in the evening, Disp: , Rfl:  .  Cinnamon 500 MG capsule, Take by mouth., Disp: , Rfl:  .  clotrimazole-betamethasone (LOTRISONE) cream, APPLY TO AFFECTED AREA TWICE A DAY, Disp: 15 g, Rfl: 2 .  co-enzyme Q-10 30 MG capsule, Take 30 mg by mouth daily., Disp: , Rfl:  .  cyclobenzaprine (FLEXERIL) 10 MG tablet, Take 10 mg by mouth 3 (three) times daily. , Disp: , Rfl:  .  diclofenac (VOLTAREN) 75 MG EC tablet, TAKE 1 TABLET TWICE DAILY AS NEEDED FOR PAIN, Disp: 180 tablet, Rfl: 1 .  diclofenac sodium (VOLTAREN) 1 % GEL, APPLY 4 GRAMS TOPICALLY 4 TIMES DAILY, Disp: 100 g, Rfl: 3 .  donepezil (ARICEPT) 23 MG TABS tablet, Take 23 mg by mouth at bedtime., Disp: , Rfl:  .  doxycycline (VIBRAMYCIN) 100 MG capsule, Take 100 mg by mouth 2 (two) times daily., Disp: , Rfl:  .  Dupilumab, Asthma, (DUPIXENT) 200 MG/1.14ML SOSY, Inject into the skin. Injects bi-weekly, Disp: , Rfl:  .  DYMISTA 137-50 MCG/ACT SUSP, PLACE 2 SPRAYS INTO BOTH NOSTRILS 2 (TWO) TIMES DAILY., Disp: 23 g, Rfl: 0 .  Eluxadoline (VIBERZI) 100 MG TABS, Take 1 tablet by mouth 2 (two) times daily. , Disp: , Rfl:  .  EPINEPHrine 0.3 mg/0.3 mL IJ SOAJ injection, Inject as directed., Disp: , Rfl:  .  Fluticasone-Umeclidin-Vilant (TRELEGY ELLIPTA) 100-62.5-25 MCG/INH AEPB, Inhale 1 puff into the lungs daily., Disp: 28 each, Rfl: 2 .  furosemide (LASIX) 80 MG tablet, TAKE 1 TO 1 & 1/2 TABLETS 2 X /DAY FOR FLUID RETENTION & SWELLING OF LEGS, Disp: 270 tablet, Rfl: 1 .  Galcanezumab-gnlm (EMGALITY) 120 MG/ML SOAJ, Inject into the skin. Pt receives this monthly ingection, Disp: , Rfl:  .  GLUCOSAMINE-CHONDROITIN PO, Take 3 tablets by mouth daily., Disp: , Rfl:  .  guaiFENesin (MUCINEX) 600 MG 12 hr tablet, Take 600 mg by mouth 2  (two) times daily as needed for cough or to loosen phlegm. Instructed to take 3 tablets in the morning, Disp: , Rfl:  .  KRILL OIL PO, Take by mouth., Disp: , Rfl:  .  l-methylfolate-B6-B12 (METANX) 3-35-2 MG TABS tablet, Take 1 tablet by mouth daily., Disp: , Rfl:  .  lidocaine (LIDODERM) 5 %, Place 1 patch onto the skin daily. Remove & Discard patch within 12 hours or as directed by MD, Disp: 14 patch, Rfl: 0 .  LUTEIN PO, Take 1 tablet by  mouth 2 (two) times daily., Disp: , Rfl:  .  LYRICA 150 MG capsule, Take 2 capsules (300 mg total) by mouth 2 (two) times daily., Disp: 120 capsule, Rfl: 2 .  memantine (NAMENDA) 10 MG tablet, Take 10 mg by mouth daily. , Disp: , Rfl:  .  metFORMIN (GLUCOPHAGE-XR) 500 MG 24 hr tablet, Take 2 tablets 2 x /day with Breakfast & Supper  for Diabetes, Disp: 360 tablet, Rfl: 3 .  metolazone (ZAROXOLYN) 5 MG tablet, Take 1 tablet daily for Fluid Retention, Disp: 90 tablet, Rfl: 1 .  montelukast (SINGULAIR) 10 MG tablet, TAKE 1 TABLET BY MOUTH EVERY DAY, Disp: 90 tablet, Rfl: 3 .  Multiple Vitamins-Minerals (MULTIVITAMIN WITH MINERALS) tablet, Take 1 tablet by mouth daily., Disp: , Rfl:  .  naloxone (NARCAN) nasal spray 4 mg/0.1 mL, USE AS DIRECTED, Disp: , Rfl:  .  ondansetron (ZOFRAN-ODT) 4 MG disintegrating tablet, TAKE 1 TABLET (4 MG TOTAL) BY MOUTH EVERY 6 (SIX) HOURS AS NEEDED FOR NAUSEA OR VOMITING., Disp: 30 tablet, Rfl: 0 .  oxyCODONE-acetaminophen (PERCOCET) 10-325 MG tablet, Take 1 tablet by mouth daily as needed., Disp: , Rfl:  .  oxymetazoline (AFRIN) 0.05 % nasal spray, Place 1 spray into both nostrils See admin instructions. 1 SPRAY PER SIDE EACH NIGHT BEFORE DYMISTA, Disp: , Rfl:  .  pantoprazole (PROTONIX) 40 MG tablet, Take 1 tablet Daily for Acid Indigestion & Reflux, Disp: 90 tablet, Rfl: 3 .  pentosan polysulfate (ELMIRON) 100 MG capsule, Take 100 mg by mouth 3 (three) times daily before meals. , Disp: , Rfl:  .  potassium chloride SA (KLOR-CON M20)  20 MEQ tablet, TAKE 1 TABLET BY MOUTH TWICE A DAY, Disp: 180 tablet, Rfl: 1 .  pravastatin (PRAVACHOL) 40 MG tablet, TAKE 1 TABLET BY MOUTH EVERYDAY AT BEDTIME (Patient taking differently: Take 20 mg by mouth daily. TAKE 1 TABLET BY MOUTH EVERYDAY AT BEDTIME), Disp: 90 tablet, Rfl: 1 .  PRESCRIPTION MEDICATION, Pt receives weekly allergy shots, Disp: , Rfl:  .  PROBIOTIC PRODUCT PO, Take by mouth., Disp: , Rfl:  .  sertraline (ZOLOFT) 50 MG tablet, Take 50 mg by mouth 2 (two) times daily. , Disp: , Rfl:  .  sodium chloride (OCEAN) 0.65 % SOLN nasal spray, Place 1 spray into both nostrils 4 (four) times daily as needed for congestion. Uses each time before other nasal sprays, Disp: , Rfl:  .  tadalafil (CIALIS) 5 MG tablet, Take 5 mg by mouth every evening. , Disp: , Rfl:  .  triamcinolone cream (KENALOG) 0.1 %, APPLY TOPICALLY 4 TIMES A DAY (Patient taking differently: APPLY TOPICALLY 2 TIMES A DAY AS NEEDED for itching), Disp: 30 g, Rfl: 1 .  trospium (SANCTURA) 20 MG tablet, Take 20 mg by mouth 2 (two) times daily. , Disp: , Rfl: 2 .  TURMERIC PO, Take 3 capsules by mouth daily., Disp: , Rfl:  .  desmopressin (DDAVP) 0.2 MG tablet, Take 200 mcg by mouth daily. , Disp: , Rfl: 3  Current Facility-Administered Medications:  [name]  Mepolizumab SOLR 100 mg, 100 mg, Subcutaneous, Q28 days, [name], MD, 100 mg at 05/11/17 1546     Observations/Objective:  General : Well sounding patient in no apparent distress HEENT: no hoarseness, no cough for duration of visit Lungs: speaks in complete sentences, no audible wheezing, no apparent distress Neurological: alert, oriented x 3 Psychiatric: pleasant, judgement appropriate    Assessment and Plan:  [place] was seen today for asthma  and bronchitis.  Diagnoses and all orders for this visit:  Asthma-COPD overlap syndrome ([place]) Continue daily medications;  Discussed the importance of avoiding unnecessary antibiotic therapy. Suggested  symptomatic OTC remedies. Nasal saline spray for congestion. Nasal steroids, allergy pill, oral steroids offered Follow up as needed and will change antibiotic Go to the ER if any chest pain, shortness of breath, nausea, dizziness, severe HA, changes vision/speech -     promethazine-dextromethorphan (PROMETHAZINE-DM) 6.25-15 MG/5ML syrup; Take 5 mLs by mouth 4 (four) times daily as needed for cough. -     predniSONE (DELTASONE) 20 MG tablet; 2 tablets daily for 3 days, 1 tablet daily for 4 days.   Follow Up Instructions:    I discussed the assessment and treatment plan with the patient. The patient was provided an opportunity to ask questions and all were answered. The patient agreed with the plan and demonstrated an understanding of the instructions.   The patient was advised to call back or seek an in-person evaluation if the symptoms worsen or if the condition fails to improve as anticipated.  I provided 16 minutes of non-face-to-face time during this encounter.   Izora Ribas, NP

## 2018-10-29 ENCOUNTER — Other Ambulatory Visit: Payer: Self-pay | Admitting: Allergy & Immunology

## 2018-11-03 DIAGNOSIS — M4722 Other spondylosis with radiculopathy, cervical region: Secondary | ICD-10-CM | POA: Diagnosis not present

## 2018-11-03 DIAGNOSIS — M5416 Radiculopathy, lumbar region: Secondary | ICD-10-CM | POA: Diagnosis not present

## 2018-11-04 ENCOUNTER — Other Ambulatory Visit: Payer: Self-pay

## 2018-11-04 ENCOUNTER — Ambulatory Visit: Payer: BC Managed Care – PPO | Admitting: Allergy & Immunology

## 2018-11-04 ENCOUNTER — Encounter: Payer: Self-pay | Admitting: Allergy & Immunology

## 2018-11-04 VITALS — BP 128/80 | HR 100 | Temp 99.4°F | Resp 20

## 2018-11-04 DIAGNOSIS — J3089 Other allergic rhinitis: Secondary | ICD-10-CM

## 2018-11-04 DIAGNOSIS — L2084 Intrinsic (allergic) eczema: Secondary | ICD-10-CM

## 2018-11-04 DIAGNOSIS — J302 Other seasonal allergic rhinitis: Secondary | ICD-10-CM

## 2018-11-04 DIAGNOSIS — J4540 Moderate persistent asthma, uncomplicated: Secondary | ICD-10-CM | POA: Diagnosis not present

## 2018-11-04 DIAGNOSIS — B999 Unspecified infectious disease: Secondary | ICD-10-CM

## 2018-11-04 DIAGNOSIS — K219 Gastro-esophageal reflux disease without esophagitis: Secondary | ICD-10-CM

## 2018-11-04 MED ORDER — IPRATROPIUM-ALBUTEROL 0.5-2.5 (3) MG/3ML IN SOLN: 3.0000 mL | RESPIRATORY_TRACT | 1 refills | Status: DC | PRN

## 2018-11-04 NOTE — Patient Instructions (Addendum)
1. 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 - If this does not work, we will apply again for the immunoglobulin replacement.  - I think we can hold off on the immunoglobulin replacement since you have not needed any antibiotics since November.   2. Asthma-COPD overlap syndrome - Lung testing deferred since this can spread the coronavirus so effectively.  - Daily controller medication(s): Dupixent 300mg  every two weeks and Trelegy 100/62.5/25 one puff once daily - Prior to physical activity: ProAir 2 puffs 10-15 minutes before physical activity. - Rescue medications: ProAir 4 puffs every 4-6 hours as needed or DuoNeb nebulizer one vial every 4-6 hours as needed - Asthma control goals:  * Full participation in all desired activities (may need albuterol before activity) * Albuterol use two time or less a week on average (not counting use with activity) * Cough interfering with sleep two time or less a month * Oral steroids no more than once a year * No hospitalizations   3. Chronic allergic rhinitis (sweet vernal grass, box elder, cat, weeds, ragweed, molds, cockroach, dust mite) - Continue with Dymista 2 sprays per nostril 1-2 times daily. - Use Afrin 1-2 sprays prior to the Dymista at night.  - Continue with saline mist 1-2 times daily.  - Continue with cetirizine 10mg  daily as needed for breakthrough symptoms.  4. GERD - Continue with ranitidine 150mg  daily.   5. Return in about 3 months (around 02/04/2019). This can be an in-person, a virtual Webex or a telephone follow up visit.   Please inform us of any Emergency Department visits, hospitalizations, or changes in symptoms. Call us before going to the ED for breathing or allergy symptoms since we might be able to fit you in for a sick visit. Feel free to contact us anytime with any questions, problems, or concerns.  It  was a pleasure to see you again today! I hope things pick improve with your back and your mother. Give Ms. Bonnita Nasuti a hug for [place]!   Websites that have reliable patient information: 1. American Academy of Asthma, Allergy, and Immunology: www.aaaai.org 2. Food Allergy Research and Education (FARE): foodallergy.org 3. Mothers of Asthmatics: http://www.asthmacommunitynetwork.org 4. American College of Allergy, Asthma, and Immunology: www.acaai.org  "Like" [place] on Facebook and Instagram for our latest updates!      Make sure you are registered to vote! If you have moved or changed any of your contact information, you will need to get this updated before voting!    Voter ID laws are NOT going into effect for the General Election in November 2020! DO NOT let this stop you from exercising your right to vote!   Absentee voting is the SAFEST way to vote during the coronavirus pandemic! Download and print an absentee ballot request form at [email or web address]  More information on absentee ballots can be found here: [email or web address]

## 2018-11-04 NOTE — Progress Notes (Signed)
FOLLOW UP  Date of Service/Encounter:  11/04/18   Assessment:   Perennial and seasonal allergic rhinitis(sweet vernal grass, box elder, cat, weeds, ragweed, molds, cockroach, dust mite)  Viral URI - leading to worse control of asthma-COPD  Recurrent infections-restarting prophylactic doxycycline today  Asthma-COPD overlap syndrome-onDupixent  Hymenoptera allergy  GERD - on Nexiumand ranitidine  Obesity - with resulting nerve impingement and joint pain  Migraines - on botulinum and Emgality injections  Polypharmacy   Plan/Recommendations:   1. 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 - If this does not work, we will apply again for the immunoglobulin replacement.  - I think we can hold off on the immunoglobulin replacement since you have not needed any antibiotics since November.   2. Asthma-COPD overlap syndrome - Lung testing deferred since this can spread the coronavirus so effectively.  - Daily controller medication(s): Dupixent 300mg  every two weeks and Trelegy 100/62.5/25 one puff once daily - Prior to physical activity: ProAir 2 puffs 10-15 minutes before physical activity. - Rescue medications: ProAir 4 puffs every 4-6 hours as needed or DuoNeb nebulizer one vial every 4-6 hours as needed - Asthma control goals:  * Full participation in all desired activities (may need albuterol before activity) * Albuterol use two time or less a week on average (not counting use with activity) * Cough interfering with sleep two time or less a month * Oral steroids no more than once a year * No hospitalizations   3. Chronic allergic rhinitis (sweet vernal grass, box elder, cat, weeds, ragweed, molds, cockroach, dust mite) - Continue with Dymista 2 sprays per nostril 1-2 times daily. - Use Afrin 1-2 sprays prior to the Dymista at night.  -  Continue with saline mist 1-2 times daily.  - Continue with cetirizine 10mg  daily as needed for breakthrough symptoms.  4. GERD - Continue with ranitidine 150mg  daily.   5. Return in about 3 months (around 02/04/2019). This can be an in-person, a virtual Webex or a telephone follow up visit.  Subjective:   Mark Harrell is a 58 y.o. male presenting today for follow up of  Chief Complaint  Patient presents with   Asthma    Mark Harrell has a history of the following: Patient Active Problem List   Diagnosis Date Noted   Chronic respiratory failure with hypoxia ([place]) 04/12/2018   Bilateral lower extremity edema 12/29/2017   Atypical chest pain 12/29/2017   Hymenoptera allergy 11/10/2017   Diverticulosis 08/07/2017   Family history of colonic polyps 08/07/2017   Flatus 08/07/2017   Myofascial pain 08/06/2017   Seasonal and perennial allergic rhinitis 04/23/2017   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/2017   Cervicalgia 04/01/2017   Cervical spondylosis with radiculopathy 02/09/2017   Memory difficulty 12/05/2016   Morbid obesity ([place]) 12/05/2016   Diarrhea in adult patient 12/03/2016   Internal hemorrhoids 12/03/2016   Headache 11/13/2016   Migraine without aura and without status migrainosus, not intractable 10/30/2016   Irritant contact dermatitis due to frequent handwashing 06/08/2016   Lumbar radiculopathy 05/15/2016   Osteoarthritis of spine with radiculopathy, lumbar region 05/15/2016   Risk for falls 05/15/2016   Acute medial meniscal tear, left, subsequent encounter 04/23/2016   Recurrent infections 03/29/2016   Chronic nonseasonal allergic rhinitis due to pollen 03/29/2016   Polypharmacy 01/16/2016   Morbid obesity with BMI of 50.0-59.9, adult ([place]) 03/06/2015  SDAT 02/05/2015   Asthma 02/05/2015   OSA and COPD overlap syndrome ([place]) 02/05/2015   Medication management 08/02/2014   GERD (gastroesophageal reflux disease) 05/09/2014   Vitamin  D deficiency 08/01/2013   Prediabetes 08/01/2013   Positive TB test 07/29/2011   Diverticula of colon 05/07/2011   Hypertension 01/31/2011   Hyperlipidemia, mixed 01/31/2011   BPH (benign prostatic hyperplasia) 01/31/2011   Testosterone Deficiency 01/31/2011   IBS (irritable bowel syndrome) 01/31/2011   Partial complex seizure disorder with intractable epilepsy ([place]) 01/31/2011   Depression, major, recurrent, in partial remission ([place]) 01/31/2011   Asthma-COPD overlap syndrome ([place]) 01/31/2011    History obtained from: chart review and patient.  Mark Harrell is a 58 y.o. male presenting for a follow up visit.  He was last seen in October 2019.  At that time, we continued his prophylactic doxycycline 100 mg twice daily.  He has a history of isolated IgG of 583 (November 2017) and a B-cell memory defect.  For his asthma COPD, his lung testing looked worse.  We started him on a prednisone burst.  We continue Dupixent 300 mg every 2 weeks and Trelegy 1 puff once daily.  For his allergic rhinitis, we continue Dymista 2 sprays per nostril up to twice daily, Afrin nightly before the Dymista, and cetirizine 10 mg daily.  He has been on allergy shots in the past, but these were halted since he was having some any other medical issues.  His last allergy shot was in November 2018.  He had a bad fall at the end of 2019. He is going to require two different surgeries to correct this.  He is in severe pain and cannot pick up anythng over five pounds. He is not rescheduled for these surgeries at all.  They were all put on hold due to the coronavirus pandemic.  This is also unfortunately interrupted his scheduled intense weight loss program at [facility].  Asthma/Respiratory Symptom History: He has been off the [place] because the nsurance was having problems gettng this approved. The last time that he receved a Dupxent was in January 2020.  He remains on his Trelegy 1 puff once daily.  This seems to be working  well for him.  He has not needed prednisone since November 2019.  He did have an issue around a month ago when he developed some shortness of breath for 2 to 3 weeks, but this is since passed.  ACT score is 12 today, indicating poor asthma control.  This is reflective of his recent illness which disrupted his breathing.  Allergic Rhinitis Symptom History: He remains on the [place].  He is not on the allergy shots any longer.  He has not required any antibiotics since November.  He remains on cetirizine 10 mg daily.  He has not had any antibiotics since his episode in November.  After I saw him, he did have his antibiotic course extended for 1 week.  His prednisone course was also extended.  He has remained on his doxycycline twice daily, which seems to have largely kept a lot of his infections away.  Otherwise, there have been no changes to his past medical history, surgical history, family history, or social history. His mother's dementia has become more severe and she had a recent fall. There are still some surgeries pending and she is completely bedridden. He has had an uncle who has come to help and his sisters have some up for [place]. Hs father was diagnosed with lymphoma and leukemia.  Review of Systems  Constitutional: Negative.  Negative for fever, malaise/fatigue and weight loss.  HENT: Negative.  Negative for congestion, ear discharge and ear pain.   Eyes: Negative for pain, discharge and redness.  Respiratory: Negative for cough, sputum production, shortness of breath and wheezing.   Cardiovascular: Negative.  Negative for chest pain and palpitations.  Gastrointestinal: Negative for abdominal pain, heartburn, nausea and vomiting.  Skin: Negative.  Negative for itching and rash.  Neurological: Negative for dizziness and headaches.  Endo/Heme/Allergies: Negative for environmental allergies. Does not bruise/bleed easily.       Objective:   Blood pressure 128/80, pulse 100,  temperature 99.4 F (37.4 C), temperature source Oral, resp. rate 20, SpO2 93 %. There is no height or weight on file to calculate BMI.   Physical Exam:  Physical Exam  Constitutional: He appears well-developed.  Pleasant male.  Very friendly as always.  Long hair.  HENT:  Head: Normocephalic and atraumatic.  Right Ear: Tympanic membrane, external ear and ear canal normal.  Left Ear: Tympanic membrane, external ear and ear canal normal.  Nose: Mucosal edema and rhinorrhea present. No nasal deformity or septal deviation. No epistaxis. Right sinus exhibits no maxillary sinus tenderness and no frontal sinus tenderness. Left sinus exhibits no maxillary sinus tenderness and no frontal sinus tenderness.  Mouth/Throat: Uvula is midline and oropharynx is clear and moist. Mucous membranes are not pale and not dry.  No sinus tenderness.  Eyes: Pupils are equal, round, and reactive to light. Conjunctivae and EOM are normal. Right eye exhibits no chemosis and no discharge. Left eye exhibits no chemosis and no discharge. Right conjunctiva is not injected. Left conjunctiva is not injected.  No allergic shiners present.  Cardiovascular: Normal rate, regular rhythm and normal heart sounds.  Respiratory: Effort normal and breath sounds normal. No accessory muscle usage. No tachypnea. No respiratory distress. He has no wheezes. He has no rhonchi. He has no rales. He exhibits no tenderness.  Moving air well in all lung fields.  No increased work of breathing.  Lymphadenopathy:    He has no cervical adenopathy.  Neurological: He is alert.  Skin: No abrasion, no petechiae and no rash noted. Rash is not papular, not vesicular and not urticarial. No erythema. No pallor.  Psychiatric: He has a normal mood and affect.     Diagnostic studies: none    [name], MD  [facility]

## 2018-11-10 DIAGNOSIS — R419 Unspecified symptoms and signs involving cognitive functions and awareness: Secondary | ICD-10-CM | POA: Diagnosis not present

## 2018-11-10 DIAGNOSIS — G43709 Chronic migraine without aura, not intractable, without status migrainosus: Secondary | ICD-10-CM | POA: Diagnosis not present

## 2018-11-10 DIAGNOSIS — F3341 Major depressive disorder, recurrent, in partial remission: Secondary | ICD-10-CM | POA: Diagnosis not present

## 2018-11-10 DIAGNOSIS — M542 Cervicalgia: Secondary | ICD-10-CM | POA: Diagnosis not present

## 2018-11-11 ENCOUNTER — Encounter: Payer: Self-pay | Admitting: Adult Health

## 2018-11-11 ENCOUNTER — Other Ambulatory Visit: Payer: Self-pay

## 2018-11-11 ENCOUNTER — Ambulatory Visit (INDEPENDENT_AMBULATORY_CARE_PROVIDER_SITE_OTHER): Payer: BC Managed Care – PPO | Admitting: Adult Health

## 2018-11-11 DIAGNOSIS — G4733 Obstructive sleep apnea (adult) (pediatric): Secondary | ICD-10-CM | POA: Diagnosis not present

## 2018-11-11 DIAGNOSIS — J449 Chronic obstructive pulmonary disease, unspecified: Secondary | ICD-10-CM

## 2018-11-11 DIAGNOSIS — Z6841 Body Mass Index (BMI) 40.0 and over, adult: Secondary | ICD-10-CM | POA: Diagnosis not present

## 2018-11-11 NOTE — Assessment & Plan Note (Signed)
Doing well on current regimen.  Last flareup was 2 months ago.  Glad he is restarting his Dupixent.  Continue follow-up with allergist.  Plan  Patient Instructions  Continue on TRELEGY 1 puff daily . Rinse after use.  Continue on Singulair and Zyrtec daily .  Restart Dupixent as planned.  Continue on Oxygen 2l/m with activity and At bedtime   Discuss with ENT if they are using INSPIRE device for Sleep apnea. If not can refer to another ENT  Work on healthy weight .  Do not drive if sleepy .  Follow up Dr. [name] in 3-4 months and As needed   Please contact office for sooner follow up if symptoms do not improve or worsen or seek emergency care

## 2018-11-11 NOTE — Assessment & Plan Note (Signed)
-   Weight loss 

## 2018-11-11 NOTE — Patient Instructions (Signed)
Continue on TRELEGY 1 puff daily . Rinse after use.  Continue on Singulair and Zyrtec daily .  Restart Dupixent as planned.  Continue on Oxygen 2l/m with activity and At bedtime   Discuss with ENT if they are using INSPIRE device for Sleep apnea. If not can refer to another ENT  Work on healthy weight .  Do not drive if sleepy .  Follow up Dr. [name] in 3-4 months and As needed   Please contact office for sooner follow up if symptoms do not improve or worsen or seek emergency care

## 2018-11-11 NOTE — Assessment & Plan Note (Signed)
Severe sleep apnea.  Unfortunately patient is unable to tolerate CPAP is tried several times.  Patient is inquiring about the inspire implantable device.  Patient is established with an ENT.  He is going to discuss with that ENT practice to see if they perform this procedure.  If not we can refer him to another local ENT practiced to discuss if he is a possible candidate. Patient education provided on sleep apnea and potential complications of untreated sleep apnea  Encouraged on weight loss Advised on not driving if sleepy

## 2018-11-11 NOTE — Progress Notes (Signed)
@Patient  ID: Mark Harrell, male    DOB: 04/17/1961, 58 y.o.   MRN: [id number]  Chief Complaint  Patient presents with  . Follow-up    COPD     Referring provider: [name], MD  HPI: 58 year old male never smoker followed for asthma-COPD, obstructive sleep apnea and oxygen dependent respiratory failure Medical history significant for chronic sinusitis followed by ENT with previous sinus surgery, you have a plasty and tonsillectomy Memory issues on Aricept and Namenda  TEST/EVENTS :  FENO 12/28/2017-10  PFTs 12/28/2017 FVC 3.41 [65%), FEV1 2.69 67%, F/F 79, TLC 79%, DLCO 94% Minimal obstruction, mild restriction  03/09/17: Walked 2 laps / Baseline Sat 97% on RA / Nadir Sat 97% on RA (walked at a slow pace with a cane - stopped due to dyspnea) 05/08/16: Walked 321 meters / Baseline Sat 93% on RA / Nadir Sat 93% on RA @ rest  SPLIT NIGHT SLEEP STUDY (05/07/16): Severe obstructive sleep apnea with AHI 65.7 events/hour. No significant central sleep apnea occurred. Severe desaturation during diagnostic portion to 72% on room air. No cardiac abnormalities or periodic limb movements were noted. Optimal CPAP pressure was 18 cm H2O with a large-sized [employer] Full Face Mask Simplus mask. Reading physician recommended AutoPap at 10-18 cm H2O pressure.  CT chest 01/04/2017-no PE, lungs are clear. Chest x-ray 12/08/2017-no active cardiopulmonary disease. I reviewed the images personally.  ESOPHAGRAM/BARIUM SWALLOW 04/15/16 (per radiologist):Mild nonspecific esophageal dysmotility disorder. Episode of flash laryngeal penetration with swallowing. Small sliding type I hiatal hernia. No visible mass, erosion, or ulcerations.  MICROBIOLOGY Sputum Culture 02/16/17: Normal Flora   LABS 04/10/16 Alpha-1 antitrypsin: MM (135)  03/28/16 CBC: 11.8/14.2/43.0/309 Eosinophils: 354 IgG: 583 IgA: 154 IgM: 46 IgE: 92   11/02/13 ANA: Negative DS DNA Ab: <1 SSA: <1.0  SSB: <1.0  02/24/11 ACE: 35 RF: <10  Previously on Nucala in 2018 with no perceived benefit Dupixent started January 2019 followed by allergy and immunology  Personal/social history: No pets, no known occupational exposures, no hot tubs, no basement, never smoker. Previously lived in [place], [place], [place], [place] Self employed.  11/11/2018 Follow up : Chronic Asthma , OSA , O2 RF  Patient returns for a 7-month follow-up.  Patient has underlying chronic obstructive asthma.  He remains on Trelegy and Singulair daily.  Was previously on [place] but has not had in several months due to insurance.  Has been approved and starts back on it with home injections on Saturday this week.  He denies any flare of cough or wheezing.  No increased albuterol use. He is followed by the allergist. Last asthma flare was 2 months, treated with prednisone that resolved symptoms.    He is very deconditioned with multiple medical problems and is unable to exercise.  Gets winded with heavy activity.  And cannot walk long distances.  Is on oxygen 2 L with activity and At bedtime  . Has a portable system but is not able to carry POC due to neck issues . Is working on weight loss. Down 40lbs over last 6 months .   He has underlying severe sleep apnea.  Is supposed to be on CPAP.  In the past has had aspiration events leading to pneumonia  while wearing CPAP.  He is not wearing CPAP and does not want to restart this. says he is very intolerant. Questions if the INSPIRE device .  Wears oxygen 2 L at bedtime.   Allergies  Allergen Reactions  . Bee Venom Swelling  .  Ppd [Tuberculin Purified Protein Derivative] Other (See Comments)    +ppd NEG Quantferron Gold 3/13  . Fenofibrate Other (See Comments)    Back pain  . Levofloxacin Diarrhea  . Other Other (See Comments)    Some antibiotics cause diarrhea  . Verapamil Other (See Comments)    Back pain  . Claritin [Loratadine] Other (See Comments)    unknown   . Duloxetine     Brought on asthma    Immunization History  Administered Date(s) Administered  . Influenza,inj,Quad PF,6+ Mos 03/04/2016, 03/09/2017, 05/19/2018  . Influenza-Unspecified 02/24/2015, 05/19/2018  . PPD Test 08/06/2011  . Pneumococcal Conjugate-13 11/02/2014  . Pneumococcal Polysaccharide-23 04/10/2016  . Pneumococcal-Unspecified 05/26/2004  . Td 05/26/2000  . Tdap 08/01/2013  . Zoster 05/26/2009  . Zoster Recombinat (Shingrix) 11/05/2016, 03/14/2017    Past Medical History:  Diagnosis Date  . Anesthesia complication requiring reversal agent administration    ? from central apnea, very difficult to get off vent  . Anxiety   . Arthritis    osteo  . Asthma   . BPH (benign prostatic hyperplasia)   . Complication of anesthesia    difficulty waking , they twlight me because of my respiratory problems "  . Dyspnea   . Enlarged heart   . Family history of adverse reaction to anesthesia    mother trouble waking up, and heart stopped  . GERD (gastroesophageal reflux disease)   . Headache    botox injections for headaches  . Hyperlipidemia   . Hypertension   . Hypogonadism male   . IBS (irritable bowel syndrome)   . Obesity   . OSA (obstructive sleep apnea)    cpap  . Pneumonia   . Pre-diabetes   . Prostatitis     Tobacco History: Social History   Tobacco Use  Smoking Status Former Smoker  . Packs/day: 0.10  . Years: 15.00  . Pack years: 1.50  Smokeless Tobacco Never Used  Tobacco Comment   significant second-hand exposure through mother   Counseling given: Not Answered Comment: significant second-hand exposure through mother   Outpatient Medications Prior to Visit  Medication Sig Dispense Refill  . albuterol (PROVENTIL HFA;VENTOLIN HFA) 108 (90 Base) MCG/ACT inhaler 1 to 2 inhalations 10 to 15 minutes apart every 4 hrs to rescue Asthma 60 g 1  . alfuzosin (UROXATRAL) 10 MG 24 hr tablet Take 10 mg by mouth daily.      [name] anastrozole (ARIMIDEX) 1  MG tablet daily.    . baclofen (LIORESAL) 10 MG tablet Take 10 mg by mouth 3 (three) times daily as needed for muscle spasms.     . bisoprolol-hydrochlorothiazide (ZIAC) 5-6.25 MG tablet TAKE 1 TABLET BY MOUTH EVERY DAY 90 tablet 1  . Botulinum Toxin Type A 200 units SOLR Inject into the skin.    . Calcium Carbonate-Vitamin D (CALCIUM + D PO) Take 500 mg by mouth 3 (three) times daily.     . cetaphil (CETAPHIL) lotion Apply topically 2 (two) times daily. 236 mL 0  . cetirizine (ZYRTEC) 10 MG tablet Take 10 mg by mouth daily.    . Cholecalciferol (VITAMIN D PO) Take 10,000-20,000 Units by mouth 2 (two) times daily. 20000 in the morning and 10000 in the evening    . Cinnamon 500 MG capsule Take by mouth.    . clotrimazole-betamethasone (LOTRISONE) cream APPLY TO AFFECTED AREA TWICE A DAY 15 g 2  . co-enzyme Q-10 30 MG capsule Take 30 mg by mouth daily.    [name]  cyclobenzaprine (FLEXERIL) 10 MG tablet Take 10 mg by mouth 3 (three) times daily.     . diclofenac sodium (VOLTAREN) 1 % GEL APPLY 4 GRAMS TOPICALLY 4 TIMES DAILY 100 g 3  . donepezil (ARICEPT) 23 MG TABS tablet Take 23 mg by mouth at bedtime.    [name] doxycycline (VIBRAMYCIN) 100 MG capsule Take 100 mg by mouth 2 (two) times daily.    . DUPIXENT 300 MG/2ML SOSY INJECT 300 MG UNDER THE SKIN (SUBCUTANEOUS INJECTION) EVERY 14 DAYS 4 mL 5  . DYMISTA 137-50 MCG/ACT SUSP PLACE 2 SPRAYS INTO BOTH NOSTRILS 2 (TWO) TIMES DAILY. 23 g 0  . Eluxadoline (VIBERZI) 100 MG TABS Take 1 tablet by mouth 2 (two) times daily.     [name] EPINEPHrine 0.3 mg/0.3 mL IJ SOAJ injection Inject as directed.    . Fluticasone-Umeclidin-Vilant (TRELEGY ELLIPTA) 100-62.5-25 MCG/INH AEPB Inhale 1 puff into the lungs daily. 28 each 2  . furosemide (LASIX) 80 MG tablet TAKE 1 TO 1 & 1/2 TABLETS 2 X /DAY FOR FLUID RETENTION & SWELLING OF LEGS 270 tablet 1  . GLUCOSAMINE-CHONDROITIN PO Take 3 tablets by mouth daily.    [name] guaiFENesin (MUCINEX) 600 MG 12 hr tablet Take 600 mg by mouth 2  (two) times daily as needed for cough or to loosen phlegm. Instructed to take 3 tablets in the morning    . ipratropium-albuterol (DUONEB) 0.5-2.5 (3) MG/3ML SOLN Take 3 mLs by nebulization every 4 (four) hours as needed. 270 mL 1  . KRILL OIL PO Take by mouth.    [name] l-methylfolate-B6-B12 (METANX) 3-35-2 MG TABS tablet Take 1 tablet by mouth daily.    . LUTEIN PO Take 1 tablet by mouth 2 (two) times daily.    [name] LYRICA 150 MG capsule Take 2 capsules (300 mg total) by mouth 2 (two) times daily. 120 capsule 2  . memantine (NAMENDA) 10 MG tablet Take 10 mg by mouth daily.     . metFORMIN (GLUCOPHAGE-XR) 500 MG 24 hr tablet Take 2 tablets 2 x /day with Breakfast & Supper  for Diabetes 360 tablet 3  . metolazone (ZAROXOLYN) 5 MG tablet Take 1 tablet daily for Fluid Retention 90 tablet 1  . montelukast (SINGULAIR) 10 MG tablet TAKE 1 TABLET BY MOUTH EVERY DAY 90 tablet 3  . Multiple Vitamins-Minerals (MULTIVITAMIN WITH MINERALS) tablet Take 1 tablet by mouth daily.    . naloxone (NARCAN) nasal spray 4 mg/0.1 mL USE AS DIRECTED    . ondansetron (ZOFRAN-ODT) 4 MG disintegrating tablet TAKE 1 TABLET (4 MG TOTAL) BY MOUTH EVERY 6 (SIX) HOURS AS NEEDED FOR NAUSEA OR VOMITING. 30 tablet 0  . oxyCODONE-acetaminophen (PERCOCET) 10-325 MG tablet Take 1 tablet by mouth daily as needed.    [name] oxymetazoline (AFRIN) 0.05 % nasal spray Place 1 spray into both nostrils See admin instructions. 1 SPRAY PER SIDE EACH NIGHT BEFORE DYMISTA    . pantoprazole (PROTONIX) 40 MG tablet Take 1 tablet Daily for Acid Indigestion & Reflux 90 tablet 3  . pentosan polysulfate (ELMIRON) 100 MG capsule Take 100 mg by mouth 3 (three) times daily before meals.     . potassium chloride SA (KLOR-CON M20) 20 MEQ tablet TAKE 1 TABLET BY MOUTH TWICE A DAY 180 tablet 1  . pravastatin (PRAVACHOL) 40 MG tablet TAKE 1 TABLET BY MOUTH EVERYDAY AT BEDTIME (Patient taking differently: Take 20 mg by mouth daily. TAKE 1 TABLET BY MOUTH EVERYDAY AT  BEDTIME) 90 tablet 1  . PRESCRIPTION MEDICATION  Pt receives weekly allergy shots    . PROBIOTIC PRODUCT PO Take by mouth.    . promethazine-dextromethorphan (PROMETHAZINE-DM) 6.25-15 MG/5ML syrup Take 5 mLs by mouth 4 (four) times daily as needed for cough. 240 mL 1  . sertraline (ZOLOFT) 50 MG tablet Take 50 mg by mouth 2 (two) times daily.     . sodium chloride (OCEAN) 0.65 % SOLN nasal spray Place 1 spray into both nostrils 4 (four) times daily as needed for congestion. Uses each time before other nasal sprays    . tadalafil (CIALIS) 5 MG tablet Take 5 mg by mouth every evening.     . triamcinolone cream (KENALOG) 0.1 % APPLY TOPICALLY 4 TIMES A DAY (Patient taking differently: APPLY TOPICALLY 2 TIMES A DAY AS NEEDED for itching) 30 g 1  . trospium (SANCTURA) 20 MG tablet Take 20 mg by mouth 2 (two) times daily.   2  . TURMERIC PO Take 3 capsules by mouth daily.    [name] albuterol (PROVENTIL) (2.5 MG/3ML) 0.083% nebulizer solution Inhale 3 mLs (2.5 mg total) into the lungs every 4 (four) hours as needed. 75 mL 12  . desmopressin (DDAVP) 0.2 MG tablet Take 200 mcg by mouth daily.   3  . Dupilumab, Asthma, (DUPIXENT) 200 MG/1.14ML SOSY Inject into the skin. Injects bi-weekly    . furosemide (LASIX) 40 MG tablet Take 40 mg by mouth daily.    [name] Galcanezumab-gnlm (EMGALITY) 120 MG/ML SOAJ Inject into the skin. Pt receives this monthly ingection    . lidocaine (LIDODERM) 5 % Place 1 patch onto the skin daily. Remove & Discard patch within 12 hours or as directed by MD 14 patch 0   Facility-Administered Medications Prior to Visit  Medication Dose Route Frequency Provider Last Rate Last Dose  . Mepolizumab SOLR 100 mg  100 mg Subcutaneous Q28 days [name], MD   100 mg at 05/11/17 1546     Review of Systems:   Constitutional:   No  weight loss, night sweats,  Fevers, chills, fatigue, or  lassitude.  HEENT:   No headaches,  Difficulty swallowing,  Tooth/dental problems, or  Sore throat,                 No sneezing, itching, ear ache, nasal congestion, post nasal drip,   CV:  No chest pain,  Orthopnea, PND, swelling in lower extremities, anasarca, dizziness, palpitations, syncope.   GI  No heartburn, indigestion, abdominal pain, nausea, vomiting, diarrhea, change in bowel habits, loss of appetite, bloody stools.   Resp: No shortness of breath with exertion or at rest.  No excess mucus, no productive cough,  No non-productive cough,  No coughing up of blood.  No change in color of mucus.  No wheezing.  No chest wall deformity  Skin: no rash or lesions.  GU: no dysuria, change in color of urine, no urgency or frequency.  No flank pain, no hematuria   MS:  No joint pain or swelling.  No decreased range of motion.  No back pain.    Physical Exam  BP 124/82   Pulse 81   Temp 98.2 F (36.8 C) (Oral)   Ht 6' (1.829 m)   Wt (!) 362 lb 9.6 oz (164.5 kg)   SpO2 95%   BMI 49.18 kg/m   GEN: A/Ox3; pleasant , NAD, well nourished    HEENT:  [place]/AT,  EACs-clear, TMs-wnl, NOSE-clear, THROAT-clear, no lesions, no postnasal drip or exudate noted.   NECK:  Supple w/ fair ROM; no JVD; normal carotid impulses w/o bruits; no thyromegaly or nodules palpated; no lymphadenopathy.    RESP  Clear  P & A; w/o, wheezes/ rales/ or rhonchi. no accessory muscle use, no dullness to percussion  CARD:  RRR, no m/r/g, no peripheral edema, pulses intact, no cyanosis or clubbing.  GI:   Soft & nt; nml bowel sounds; no organomegaly or masses detected.   Musco: Warm bil, no deformities or joint swelling noted.   Neuro: alert, no focal deficits noted.    Skin: Warm, no lesions or rashes    Lab Results:  CBC    Component Value Date/Time   WBC 8.7 07/27/2018 1612   RBC 5.24 07/27/2018 1612   HGB 15.7 07/27/2018 1612   HGB 14.1 12/25/2010 1442   HCT 46.2 07/27/2018 1612   HCT 41.9 12/25/2010 1442   PLT 241 07/27/2018 1612   PLT 275 12/25/2010 1442   MCV 88.2 07/27/2018 1612   MCV  89.8 12/25/2010 1442   MCH 30.0 07/27/2018 1612   MCHC 34.0 07/27/2018 1612   RDW 12.5 07/27/2018 1612   RDW 13.3 12/25/2010 1442   LYMPHSABS 3,176 07/27/2018 1612   LYMPHSABS 3.3 12/25/2010 1442   MONOABS 1.0 08/12/2017 1550   MONOABS 1.0 (H) 12/25/2010 1442   EOSABS 191 07/27/2018 1612   EOSABS 0.3 12/25/2010 1442   BASOSABS 70 07/27/2018 1612   BASOSABS 0.1 12/25/2010 1442    BMET    Component Value Date/Time   NA 139 07/27/2018 1612   K 4.2 07/27/2018 1612   CL 100 07/27/2018 1612   CO2 27 07/27/2018 1612   GLUCOSE 79 07/27/2018 1612   BUN 14 07/27/2018 1612   CREATININE 1.00 07/27/2018 1612   CALCIUM 9.7 07/27/2018 1612   GFRNONAA 83 07/27/2018 1612   GFRAA 96 07/27/2018 1612    BNP    Component Value Date/Time   BNP 173 (H) 01/08/2017 1611    ProBNP    Component Value Date/Time   PROBNP 37.0 08/13/2017 1532    Imaging: No results found.    PFT Results Latest Ref Rng & Units 12/28/2017 04/21/2016  FVC-Pre L 3.58 3.47  FVC-Predicted Pre % 68 66  FVC-Post L 3.41 3.84  FVC-Predicted Post % 65 73  Pre FEV1/FVC % % 75 74  Post FEV1/FCV % % 79 72  FEV1-Pre L 2.70 2.58  FEV1-Predicted Pre % 68 64  FEV1-Post L 2.69 2.78  DLCO UNC% % 94 95  DLCO COR %Predicted % 125 117  TLC L 5.84 6.21  TLC % Predicted % 79 84  RV % Predicted % 100 96    Lab Results  Component Value Date   NITRICOXIDE 10 12/28/2017        Assessment & Plan:   Asthma-COPD overlap syndrome (HCC) Doing well on current regimen.  Last flareup was 2 months ago.  Glad he is restarting his Dupixent.  Continue follow-up with allergist.  Plan  Patient Instructions  Continue on TRELEGY 1 puff daily . Rinse after use.  Continue on Singulair and Zyrtec daily .  Restart Dupixent as planned.  Continue on Oxygen 2l/m with activity and At bedtime   Discuss with ENT if they are using INSPIRE device for Sleep apnea. If not can refer to another ENT  Work on healthy weight .  Do not drive  if sleepy .  Follow up Dr. [name] in 3-4 months and As needed   Please contact office for sooner follow up  if symptoms do not improve or worsen or seek emergency care       OSA and COPD overlap syndrome (HCC) Severe sleep apnea.  Unfortunately patient is unable to tolerate CPAP is tried several times.  Patient is inquiring about the inspire implantable device.  Patient is established with an ENT.  He is going to discuss with that ENT practice to see if they perform this procedure.  If not we can refer him to another local ENT practiced to discuss if he is a possible candidate. Patient education provided on sleep apnea and potential complications of untreated sleep apnea  Encouraged on weight loss Advised on not driving if sleepy  Morbid obesity with BMI of 50.0-59.9, adult ([place]) Weight loss.     Rexene Edison, NP 11/11/2018

## 2018-11-15 ENCOUNTER — Other Ambulatory Visit: Payer: Self-pay | Admitting: Allergy & Immunology

## 2018-11-15 DIAGNOSIS — J449 Chronic obstructive pulmonary disease, unspecified: Secondary | ICD-10-CM | POA: Diagnosis not present

## 2018-11-15 DIAGNOSIS — J9611 Chronic respiratory failure with hypoxia: Secondary | ICD-10-CM | POA: Diagnosis not present

## 2018-11-15 DIAGNOSIS — G43709 Chronic migraine without aura, not intractable, without status migrainosus: Secondary | ICD-10-CM | POA: Diagnosis not present

## 2018-11-17 ENCOUNTER — Other Ambulatory Visit: Payer: Self-pay | Admitting: Allergy & Immunology

## 2018-11-18 ENCOUNTER — Other Ambulatory Visit: Payer: Self-pay | Admitting: Internal Medicine

## 2018-11-19 DIAGNOSIS — G4733 Obstructive sleep apnea (adult) (pediatric): Secondary | ICD-10-CM | POA: Diagnosis not present

## 2018-11-19 DIAGNOSIS — R069 Unspecified abnormalities of breathing: Secondary | ICD-10-CM | POA: Diagnosis not present

## 2018-11-19 DIAGNOSIS — J449 Chronic obstructive pulmonary disease, unspecified: Secondary | ICD-10-CM | POA: Diagnosis not present

## 2018-11-23 ENCOUNTER — Other Ambulatory Visit: Payer: Self-pay

## 2018-11-23 ENCOUNTER — Ambulatory Visit: Payer: BC Managed Care – PPO | Admitting: Podiatry

## 2018-11-23 ENCOUNTER — Encounter: Payer: Self-pay | Admitting: Podiatry

## 2018-11-23 VITALS — Temp 97.3°F

## 2018-11-23 DIAGNOSIS — M79676 Pain in unspecified toe(s): Secondary | ICD-10-CM

## 2018-11-23 DIAGNOSIS — M722 Plantar fascial fibromatosis: Secondary | ICD-10-CM | POA: Diagnosis not present

## 2018-11-23 DIAGNOSIS — B351 Tinea unguium: Secondary | ICD-10-CM | POA: Diagnosis not present

## 2018-11-23 NOTE — Progress Notes (Signed)
He presents today chief complaint of painful elongated toenails.  He is also requesting injection stating that his heels just recently started hurting again.  Objective: Pulses are strongly palpable.  Neurologic sensorium is intact.  dT reflexes are intact muscle strength is normal symmetrical.  Toenails are long thick yellow dystrophic clinically mycotic also has pain on palpation of the plantar fascia bilaterally.  Assessment: Plantar fasciitis bilateral.  Pain limb secondary to onychomycosis  Plan: Debridement of toenails 1 through 5 bilateral.  Reinjected the bilateral heels today 20 mg Kenalog 5 mg Marcaine.  Tolerated procedure well without complications.  Follow-up with him in about 3 months

## 2018-11-25 ENCOUNTER — Other Ambulatory Visit (INDEPENDENT_AMBULATORY_CARE_PROVIDER_SITE_OTHER): Payer: Self-pay | Admitting: Orthopaedic Surgery

## 2018-12-09 DIAGNOSIS — G43709 Chronic migraine without aura, not intractable, without status migrainosus: Secondary | ICD-10-CM | POA: Diagnosis not present

## 2018-12-15 DIAGNOSIS — J9611 Chronic respiratory failure with hypoxia: Secondary | ICD-10-CM | POA: Diagnosis not present

## 2018-12-15 DIAGNOSIS — J449 Chronic obstructive pulmonary disease, unspecified: Secondary | ICD-10-CM | POA: Diagnosis not present

## 2018-12-19 DIAGNOSIS — R069 Unspecified abnormalities of breathing: Secondary | ICD-10-CM | POA: Diagnosis not present

## 2018-12-19 DIAGNOSIS — J449 Chronic obstructive pulmonary disease, unspecified: Secondary | ICD-10-CM | POA: Diagnosis not present

## 2018-12-19 DIAGNOSIS — G4733 Obstructive sleep apnea (adult) (pediatric): Secondary | ICD-10-CM | POA: Diagnosis not present

## 2018-12-23 ENCOUNTER — Ambulatory Visit: Payer: Self-pay

## 2018-12-23 ENCOUNTER — Ambulatory Visit (INDEPENDENT_AMBULATORY_CARE_PROVIDER_SITE_OTHER): Payer: BC Managed Care – PPO | Admitting: Surgery

## 2018-12-23 ENCOUNTER — Other Ambulatory Visit: Payer: Self-pay

## 2018-12-23 ENCOUNTER — Encounter: Payer: Self-pay | Admitting: Surgery

## 2018-12-23 DIAGNOSIS — M5136 Other intervertebral disc degeneration, lumbar region: Secondary | ICD-10-CM

## 2018-12-23 DIAGNOSIS — M5030 Other cervical disc degeneration, unspecified cervical region: Secondary | ICD-10-CM | POA: Diagnosis not present

## 2018-12-23 DIAGNOSIS — M25562 Pain in left knee: Secondary | ICD-10-CM

## 2018-12-23 DIAGNOSIS — M4802 Spinal stenosis, cervical region: Secondary | ICD-10-CM | POA: Diagnosis not present

## 2018-12-23 DIAGNOSIS — M25561 Pain in right knee: Secondary | ICD-10-CM | POA: Diagnosis not present

## 2018-12-23 DIAGNOSIS — M65312 Trigger thumb, left thumb: Secondary | ICD-10-CM | POA: Diagnosis not present

## 2018-12-23 DIAGNOSIS — M4807 Spinal stenosis, lumbosacral region: Secondary | ICD-10-CM
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
## 2018-12-24 ENCOUNTER — Telehealth: Payer: Self-pay

## 2018-12-24 NOTE — Telephone Encounter (Signed)
   [facility] Pre-operative Risk Assessment    Request for surgical clearance:  1. What type of surgery is being performed? Cervical Fusion C 5-6 C 6-7  2. When is this surgery scheduled? TBD  3. What type of clearance is required Medical  4. Are there any medications that need to be held prior to surgery and how long? None  5. Practice name and name of physician performing surgery? [facility]  6. What is your office phone number [phone]   7.   What is your office fax number [phone]  8.   Anesthesia type   Not listed   Kathyrn Lass 12/24/2018, 12:46 PM  _________________________________________________________________   (provider comments below)

## 2018-12-24 NOTE — Telephone Encounter (Signed)
   Primary Cardiologist: Mark Burow, MD  Chart reviewed as part of pre-operative protocol coverage. Patient was contacted 12/24/2018 in reference to pre-operative risk assessment for pending surgery as outlined below.  Mark Harrell was last seen on 06/25/18 by Mark Lofts, PA, for the surgery outlined below, but it was postponed due to [place] pandemic. Has been rescheduled. He was initially cleared to proceed with surgery on 06/25/18. Since that day, Mark Harrell has done well w/o cardiac symptoms. He is able to complete > 4 METS w/o angina.   Therefore, based on ACC/AHA guidelines, the patient would be at acceptable risk for the planned procedure without further cardiovascular testing.   I will route this recommendation to the requesting party via Epic fax function and remove from pre-op pool.  Please call with questions.  Mark Jester, PA-C 12/24/2018, 2:57 PM

## 2018-12-28 ENCOUNTER — Encounter: Payer: Self-pay | Admitting: Internal Medicine

## 2018-12-28 NOTE — Patient Instructions (Signed)
Vit D  & Vit C 1,000 mg   are recommended to help protect  against the Covid-19 and other Corona viruses.    Also it's recommended  to take  Zinc 50 mg  to help  protect against the Covid-19   and best place to get  is also on [employer]  and don't pay more than 6-8 cents /pill !  =============================== Coronavirus (COVID-19) Are you at risk?  Are you at risk for the Coronavirus (COVID-19)?  To be considered HIGH RISK for Coronavirus (COVID-19), you have to meet the following criteria:  . Traveled to [place], [place], [place], [place] or [place]; or in the [place] to [place], [place], [place]  . or [place]; and have fever, cough, and shortness of breath within the last 2 weeks of travel OR . Been in close contact with a person diagnosed with COVID-19 within the last 2 weeks and have  . fever, cough,and shortness of breath .  . IF YOU DO NOT MEET THESE CRITERIA, YOU ARE CONSIDERED LOW RISK FOR COVID-19.  What to do if you are HIGH RISK for COVID-19?  . If you are having a medical emergency, call 911. . Seek medical care right away. Before you go to a doctor's office, urgent care or emergency department, .  call ahead and tell them about your recent travel, contact with someone diagnosed with COVID-19  .  and your symptoms.  . You should receive instructions from your physician's office regarding next steps of care.  . When you arrive at healthcare provider, tell the healthcare staff immediately you have returned from  . visiting [place], [place], [place], [place] or [place]; or traveled in the [place] to [place], [place],  . [place] or [place] in the last two weeks or you have been in close contact with a person diagnosed with  . COVID-19 in the last 2 weeks.   . Tell the health care staff about your symptoms: fever, cough and shortness of breath. . After you have been seen by a medical provider, you will be either: o Tested for (COVID-19) and  discharged home on quarantine except to seek medical care if  o symptoms worsen, and asked to  - Stay home and avoid contact with others until you get your results (4-5 days)  - Avoid travel on public transportation if possible (such as bus, train, or airplane) or o Sent to the Emergency Department by EMS for evaluation, COVID-19 testing  and  o possible admission depending on your condition and test results.  What to do if you are LOW RISK for COVID-19?  Reduce your risk of any infection by using the same precautions used for avoiding the common cold or flu:  . Wash your hands often with soap and warm water for at least 20 seconds.  If soap and water are not readily available,  . use an alcohol-based hand sanitizer with at least 60% alcohol.  . If coughing or sneezing, cover your mouth and nose by coughing or sneezing into the elbow areas of your shirt or coat, .  into a tissue or into your sleeve (not your hands). . Avoid shaking hands with others and consider head nods or verbal greetings only. . Avoid touching your eyes, nose, or mouth with unwashed hands.  . Avoid close contact with people who are sick. . Avoid places or events with large numbers of people in one location, like concerts or sporting events. .   Carefully consider travel plans you have or are making. . If you are planning any travel outside or inside the [place], visit the CDC's Travelers' Health webpage for the latest health notices. . If you have some symptoms but not all symptoms, continue to monitor at home and seek medical attention  . if your symptoms worsen. . If you are having a medical emergency, call 911. >>>>>>>>>>>>>>>>>>>>>>>>>>>> Preventive Care for Adults  A healthy lifestyle and preventive care can promote health and wellness. Preventive health guidelines for men include the following key practices:  A routine yearly physical is a good way to check with your health care provider about your health and  preventative screening. It is a chance to share any concerns and updates on your health and to receive a thorough exam.  Visit your dentist for a routine exam and preventative care every 6 months. Brush your teeth twice a day and floss once a day. Good oral hygiene prevents tooth decay and gum disease.  The frequency of eye exams is based on your age, health, family medical history, use of contact lenses, and other factors. Follow your health care provider's recommendations for frequency of eye exams.  Eat a healthy diet. Foods such as vegetables, fruits, whole grains, low-fat dairy products, and lean protein foods contain the nutrients you need without too many calories. Decrease your intake of foods high in solid fats, added sugars, and salt. Eat the right amount of calories for you. Get information about a proper diet from your health care provider, if necessary.  Regular physical exercise is one of the most important things you can do for your health. Most adults should get at least 150 minutes of moderate-intensity exercise (any activity that increases your heart rate and causes you to sweat) each week. In addition, most adults need muscle-strengthening exercises on 2 or more days a week.  Maintain a healthy weight. The body mass index (BMI) is a screening tool to identify possible weight problems. It provides an estimate of body fat based on height and weight. Your health care provider can find your BMI and can help you achieve or maintain a healthy weight. For adults 20 years and older:  A BMI below 18.5 is considered underweight.  A BMI of 18.5 to 24.9 is normal.  A BMI of 25 to 29.9 is considered overweight.  A BMI of 30 and above is considered obese.  Maintain normal blood lipids and cholesterol levels by exercising and minimizing your intake of saturated fat. Eat a balanced diet with plenty of fruit and vegetables. Blood tests for lipids and cholesterol should begin at age 20 and be  repeated every 5 years. If your lipid or cholesterol levels are high, you are over 50, or you are at high risk for heart disease, you may need your cholesterol levels checked more frequently. Ongoing high lipid and cholesterol levels should be treated with medicines if diet and exercise are not working.  If you smoke, find out from your health care provider how to quit. If you do not use tobacco, do not start.  Lung cancer screening is recommended for adults aged 55-80 years who are at high risk for developing lung cancer because of a history of smoking. A yearly low-dose CT scan of the lungs is recommended for people who have at least a 30-pack-year history of smoking and are a current smoker or have quit within the past 15 years. A pack year of smoking is smoking an average of 1   pack of cigarettes a day for 1 year (for example: 1 pack a day for 30 years or 2 packs a day for 15 years). Yearly screening should continue until the smoker has stopped smoking for at least 15 years. Yearly screening should be stopped for people who develop a health problem that would prevent them from having lung cancer treatment.  If you choose to drink alcohol, do not have more than 2 drinks per day. One drink is considered to be 12 ounces (355 mL) of beer, 5 ounces (148 mL) of wine, or 1.5 ounces (44 mL) of liquor.  Avoid use of street drugs. Do not share needles with anyone. Ask for help if you need support or instructions about stopping the use of drugs.  High blood pressure causes heart disease and increases the risk of stroke. Your blood pressure should be checked at least every 1-2 years. Ongoing high blood pressure should be treated with medicines, if weight loss and exercise are not effective.  If you are 45-79 years old, ask your health care provider if you should take aspirin to prevent heart disease.  Diabetes screening involves taking a blood sample to check your fasting blood sugar level. This should be done  once every 3 years, after age 45, if you are within normal weight and without risk factors for diabetes. Testing should be considered at a younger age or be carried out more frequently if you are overweight and have at least 1 risk factor for diabetes.  Colorectal cancer can be detected and often prevented. Most routine colorectal cancer screening begins at the age of 50 and continues through age 75. However, your health care provider may recommend screening at an earlier age if you have risk factors for colon cancer. On a yearly basis, your health care provider may provide home test kits to check for hidden blood in the stool. Use of a small camera at the end of a tube to directly examine the colon (sigmoidoscopy or colonoscopy) can detect the earliest forms of colorectal cancer. Talk to your health care provider about this at age 50, when routine screening begins. Direct exam of the colon should be repeated every 5-10 years through age 75, unless early forms of precancerous polyps or small growths are found.   Talk with your health care provider about prostate cancer screening.  Testicular cancer screening isrecommended for adult males. Screening includes self-exam, a health care provider exam, and other screening tests. Consult with your health care provider about any symptoms you have or any concerns you have about testicular cancer.  Use sunscreen. Apply sunscreen liberally and repeatedly throughout the day. You should seek shade when your shadow is shorter than you. Protect yourself by wearing long sleeves, pants, a wide-brimmed hat, and sunglasses year round, whenever you are outdoors.  Once a month, do a whole-body skin exam, using a mirror to look at the skin on your back. Tell your health care provider about new moles, moles that have irregular borders, moles that are larger than a pencil eraser, or moles that have changed in shape or color.  Stay current with required vaccines  (immunizations).  Influenza vaccine. All adults should be immunized every year.  Tetanus, diphtheria, and acellular pertussis (Td, Tdap) vaccine. An adult who has not previously received Tdap or who does not know his vaccine status should receive 1 dose of Tdap. This initial dose should be followed by tetanus and diphtheria toxoids (Td) booster doses every 10 years. Adults with an   unknown or incomplete history of completing a 3-dose immunization series with Td-containing vaccines should begin or complete a primary immunization series including a Tdap dose. Adults should receive a Td booster every 10 years.  Varicella vaccine. An adult without evidence of immunity to varicella should receive 2 doses or a second dose if he has previously received 1 dose.  Human papillomavirus (HPV) vaccine. Males aged 13-21 years who have not received the vaccine previously should receive the 3-dose series. Males aged 22-26 years may be immunized. Immunization is recommended through the age of 26 years for any male who has sex with males and did not get any or all doses earlier. Immunization is recommended for any person with an immunocompromised condition through the age of 26 years if he did not get any or all doses earlier. During the 3-dose series, the second dose should be obtained 4-8 weeks after the first dose. The third dose should be obtained 24 weeks after the first dose and 16 weeks after the second dose.  Zoster vaccine. One dose is recommended for adults aged 60 years or older unless certain conditions are present.    PREVNAR  - Pneumococcal 13-valent conjugate (PCV13) vaccine. When indicated, a person who is uncertain of his immunization history and has no record of immunization should receive the PCV13 vaccine. An adult aged 19 years or older who has certain medical conditions and has not been previously immunized should receive 1 dose of PCV13 vaccine. This PCV13 should be followed with a dose of  pneumococcal polysaccharide (PPSV23) vaccine. The PPSV23 vaccine dose should be obtained at least 1 r more year(s) after the dose of PCV13 vaccine. An adult aged 19 years or older who has certain medical conditions and previously received 1 or more doses of PPSV23 vaccine should receive 1 dose of PCV13. The PCV13 vaccine dose should be obtained 1 or more years after the last PPSV23 vaccine dose.    PNEUMOVAX - Pneumococcal polysaccharide (PPSV23) vaccine. When PCV13 is also indicated, PCV13 should be obtained first. All adults aged 65 years and older should be immunized. An adult younger than age 65 years who has certain medical conditions should be immunized. Any person who resides in a nursing home or long-term care facility should be immunized. An adult smoker should be immunized. People with an immunocompromised condition and certain other conditions should receive both PCV13 and PPSV23 vaccines. People with human immunodeficiency virus (HIV) infection should be immunized as soon as possible after diagnosis. Immunization during chemotherapy or radiation therapy should be avoided. Routine use of PPSV23 vaccine is not recommended for American Indians, [place] Natives, or people younger than 65 years unless there are medical conditions that require PPSV23 vaccine. When indicated, people who have unknown immunization and have no record of immunization should receive PPSV23 vaccine. One-time revaccination 5 years after the first dose of PPSV23 is recommended for people aged 19-64 years who have chronic kidney failure, nephrotic syndrome, asplenia, or immunocompromised conditions. People who received 1-2 doses of PPSV23 before age 65 years should receive another dose of PPSV23 vaccine at age 65 years or later if at least 5 years have passed since the previous dose. Doses of PPSV23 are not needed for people immunized with PPSV23 at or after age 65 years.    Hepatitis A vaccine. Adults who wish to be protected  from this disease, have certain high-risk conditions, work with hepatitis A-infected animals, work in hepatitis A research labs, or travel to or work in countries with   a high rate of hepatitis A should be immunized. Adults who were previously unvaccinated and who anticipate close contact with an international adoptee during the first 60 days after arrival in the [place] from a country with a high rate of hepatitis A should be immunized.    Hepatitis B vaccine. Adults should be immunized if they wish to be protected from this disease, have certain high-risk conditions, may be exposed to blood or other infectious body fluids, are household contacts or sex partners of hepatitis B positive people, are clients or workers in certain care facilities, or travel to or work in countries with a high rate of hepatitis B.   Preventive Service / Frequency   Ages 40 to 64  Blood pressure check.  Lipid and cholesterol check  Lung cancer screening. / Every year if you are aged 55-80 years and have a 30-pack-year history of smoking and currently smoke or have quit within the past 15 years. Yearly screening is stopped once you have quit smoking for at least 15 years or develop a health problem that would prevent you from having lung cancer treatment.  Fecal occult blood test (FOBT) of stool. / Every year beginning at age 50 and continuing until age 75. You may not have to do this test if you get a colonoscopy every 10 years.  Flexible sigmoidoscopy** or colonoscopy.** / Every 5 years for a flexible sigmoidoscopy or every 10 years for a colonoscopy beginning at age 50 and continuing until age 75. Screening for abdominal aortic aneurysm (AAA)  by ultrasound is recommended for people who have history of high blood pressure or who are current or former smokers. +++++++++++ Recommend Adult Low Dose Aspirin or  coated  Aspirin 81 mg daily  To reduce risk of Colon Cancer 40 %,  Skin Cancer 26 % ,  Malignant  Melanoma 46%  and  Pancreatic cancer 60% ++++++++++++++++++++ Vitamin D goal  is between 70-100.  Please make sure that you are taking your Vitamin D as directed.  It is very important as a natural anti-inflammatory  helping hair, skin, and nails, as well as reducing stroke and heart attack risk.  It helps your bones and helps with mood. It also decreases numerous cancer risks so please take it as directed.  Low Vit D is associated with a 200-300% higher risk for CANCER  and 200-300% higher risk for HEART   ATTACK  &  STROKE.   ...................................... It is also associated with higher death rate at younger ages,  autoimmune diseases like Rheumatoid arthritis, Lupus, Multiple Sclerosis.    Also many other serious conditions, like depression, Alzheimer's Dementia, infertility, muscle aches, fatigue, fibromyalgia - just to name a few. +++++++++++++++++++++ Recommend the book "The END of DIETING" by Dr Joel Fuhrman  & the book "The END of DIABETES " by Dr Joel Fuhrman At [employer] - get book & Audio CD's    Being diabetic has a  300% increased risk for heart attack, stroke, cancer, and alzheimer- type vascular dementia. It is very important that you work harder with diet by avoiding all foods that are white. Avoid white rice (brown & wild rice is OK), white potatoes (sweetpotatoes in moderation is OK), White bread or wheat bread or anything made out of white flour like bagels, donuts, rolls, buns, biscuits, cakes, pastries, cookies, pizza crust, and pasta (made from white flour & egg whites) - vegetarian pasta or spinach or wheat pasta is OK. Multigrain breads like Arnold's or [employer],   or multigrain sandwich thins or flatbreads.  Diet, exercise and weight loss can reverse and cure diabetes in the early stages.  Diet, exercise and weight loss is very important in the control and prevention of complications of diabetes which affects every system in your body, ie. Brain -  dementia/stroke, eyes - glaucoma/blindness, heart - heart attack/heart failure, kidneys - dialysis, stomach - gastric paralysis, intestines - malabsorption, nerves - severe painful neuritis, circulation - gangrene & loss of a leg(s), and finally cancer and Alzheimers.    I recommend avoid fried & greasy foods,  sweets/candy, white rice (brown or wild rice or Quinoa is OK), white potatoes (sweet potatoes are OK) - anything made from white flour - bagels, doughnuts, rolls, buns, biscuits,white and wheat breads, pizza crust and traditional pasta made of white flour & egg white(vegetarian pasta or spinach or wheat pasta is OK).  Multi-grain bread is OK - like multi-grain flat bread or sandwich thins. Avoid alcohol in excess. Exercise is also important.    Eat all the vegetables you want - avoid meat, especially red meat and dairy - especially cheese.  Cheese is the most concentrated form of trans-fats which is the worst thing to clog up our arteries. Veggie cheese is OK which can be found in the fresh produce section at [employer] or [employer] or [employer]  ++++++++++++++++++++++ DASH Eating Plan  DASH stands for "Dietary Approaches to Stop Hypertension."   The DASH eating plan is a healthy eating plan that has been shown to reduce high blood pressure (hypertension). Additional health benefits may include reducing the risk of type 2 diabetes mellitus, heart disease, and stroke. The DASH eating plan may also help with weight loss. WHAT DO I NEED TO KNOW ABOUT THE DASH EATING PLAN? For the DASH eating plan, you will follow these general guidelines:  Choose foods with a percent daily value for sodium of less than 5% (as listed on the food label).  Use salt-free seasonings or herbs instead of table salt or sea salt.  Check with your health care provider or pharmacist before using salt substitutes.  Eat lower-sodium products, often labeled as "lower sodium" or "no salt added."  Eat fresh  foods.  Eat more vegetables, fruits, and low-fat dairy products.  Choose whole grains. Look for the word "whole" as the first word in the ingredient list.  Choose fish   Limit sweets, desserts, sugars, and sugary drinks.  Choose heart-healthy fats.  Eat veggie cheese   Eat more home-cooked food and less restaurant, buffet, and fast food.  Limit fried foods.  Cook foods using methods other than frying.  Limit canned vegetables. If you do use them, rinse them well to decrease the sodium.  When eating at a restaurant, ask that your food be prepared with less salt, or no salt if possible.                      WHAT FOODS CAN I EAT? Read Dr Joel Fuhrman's books on The End of Dieting & The End of Diabetes  Grains Whole grain or whole wheat bread. Brown rice. Whole grain or whole wheat pasta. Quinoa, bulgur, and whole grain cereals. Low-sodium cereals. Corn or whole wheat flour tortillas. Whole grain cornbread. Whole grain crackers. Low-sodium crackers.  Vegetables Fresh or frozen vegetables (raw, steamed, roasted, or grilled). Low-sodium or reduced-sodium tomato and vegetable juices. Low-sodium or reduced-sodium tomato sauce and paste. Low-sodium or reduced-sodium canned vegetables.   Fruits All fresh, canned (  in natural juice), or frozen fruits.  Protein Products  All fish and seafood.  Dried beans, peas, or lentils. Unsalted nuts and seeds. Unsalted canned beans.  Dairy Low-fat dairy products, such as skim or 1% milk, 2% or reduced-fat cheeses, low-fat ricotta or cottage cheese, or plain low-fat yogurt. Low-sodium or reduced-sodium cheeses.  Fats and Oils Tub margarines without trans fats. Light or reduced-fat mayonnaise and salad dressings (reduced sodium). Avocado. Safflower, olive, or canola oils. Natural peanut or almond butter.  Other Unsalted popcorn and pretzels. The items listed above may not be a complete list of recommended foods or beverages. Contact your  dietitian for more options.  +++++++++++++++++++  WHAT FOODS ARE NOT RECOMMENDED? Grains/ White flour or wheat flour White bread. White pasta. White rice. Refined cornbread. Bagels and croissants. Crackers that contain trans fat.  Vegetables  Creamed or fried vegetables. Vegetables in a . Regular canned vegetables. Regular canned tomato sauce and paste. Regular tomato and vegetable juices.  Fruits Dried fruits. Canned fruit in light or heavy syrup. Fruit juice.  Meat and Other Protein Products Meat in general - RED meat & White meat.  Fatty cuts of meat. Ribs, chicken wings, all processed meats as bacon, sausage, bologna, salami, fatback, hot dogs, bratwurst and packaged luncheon meats.  Dairy Whole or 2% milk, cream, half-and-half, and cream cheese. Whole-fat or sweetened yogurt. Full-fat cheeses or blue cheese. Non-dairy creamers and whipped toppings. Processed cheese, cheese spreads, or cheese curds.  Condiments Onion and garlic salt, seasoned salt, table salt, and sea salt. Canned and packaged gravies. Worcestershire sauce. Tartar sauce. Barbecue sauce. Teriyaki sauce. Soy sauce, including reduced sodium. Steak sauce. Fish sauce. Oyster sauce. Cocktail sauce. Horseradish. Ketchup and mustard. Meat flavorings and tenderizers. Bouillon cubes. Hot sauce. Tabasco sauce. Marinades. Taco seasonings. Relishes.  Fats and Oils Butter, stick margarine, lard, shortening and bacon fat. Coconut, palm kernel, or palm oils. Regular salad dressings.  Pickles and olives. Salted popcorn and pretzels.  The items listed above may not be a complete list of foods and beverages to avoid.    
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
## 2018-12-29 ENCOUNTER — Other Ambulatory Visit: Payer: Self-pay

## 2018-12-29 ENCOUNTER — Ambulatory Visit (INDEPENDENT_AMBULATORY_CARE_PROVIDER_SITE_OTHER): Payer: BC Managed Care – PPO | Admitting: Internal Medicine

## 2018-12-29 VITALS — BP 124/80 | HR 80 | Temp 97.4°F | Ht 72.0 in | Wt 359.4 lb

## 2018-12-29 DIAGNOSIS — Z79899 Other long term (current) drug therapy: Secondary | ICD-10-CM | POA: Diagnosis not present

## 2018-12-29 DIAGNOSIS — Z8249 Family history of ischemic heart disease and other diseases of the circulatory system: Secondary | ICD-10-CM

## 2018-12-29 DIAGNOSIS — Z0001 Encounter for general adult medical examination with abnormal findings: Secondary | ICD-10-CM

## 2018-12-29 DIAGNOSIS — Z1211 Encounter for screening for malignant neoplasm of colon: Secondary | ICD-10-CM

## 2018-12-29 DIAGNOSIS — R5383 Other fatigue: Secondary | ICD-10-CM

## 2018-12-29 DIAGNOSIS — E119 Type 2 diabetes mellitus without complications: Secondary | ICD-10-CM | POA: Diagnosis not present

## 2018-12-29 DIAGNOSIS — E66813 Obesity, class 3: Secondary | ICD-10-CM

## 2018-12-29 DIAGNOSIS — Z111 Encounter for screening for respiratory tuberculosis: Secondary | ICD-10-CM

## 2018-12-29 DIAGNOSIS — I10 Essential (primary) hypertension: Secondary | ICD-10-CM

## 2018-12-29 DIAGNOSIS — E559 Vitamin D deficiency, unspecified: Secondary | ICD-10-CM

## 2018-12-29 DIAGNOSIS — Z6841 Body Mass Index (BMI) 40.0 and over, adult: Secondary | ICD-10-CM | POA: Diagnosis not present

## 2018-12-29 DIAGNOSIS — J449 Chronic obstructive pulmonary disease, unspecified: Secondary | ICD-10-CM

## 2018-12-29 DIAGNOSIS — K5730 Diverticulosis of large intestine without perforation or abscess without bleeding: Secondary | ICD-10-CM

## 2018-12-29 DIAGNOSIS — Z125 Encounter for screening for malignant neoplasm of prostate: Secondary | ICD-10-CM | POA: Diagnosis not present

## 2018-12-29 DIAGNOSIS — K219 Gastro-esophageal reflux disease without esophagitis: Secondary | ICD-10-CM

## 2018-12-29 DIAGNOSIS — Z136 Encounter for screening for cardiovascular disorders: Secondary | ICD-10-CM

## 2018-12-29 DIAGNOSIS — N138 Other obstructive and reflux uropathy: Secondary | ICD-10-CM

## 2018-12-29 DIAGNOSIS — G4733 Obstructive sleep apnea (adult) (pediatric): Secondary | ICD-10-CM

## 2018-12-29 DIAGNOSIS — N401 Enlarged prostate with lower urinary tract symptoms: Secondary | ICD-10-CM

## 2018-12-29 DIAGNOSIS — F0150 Vascular dementia without behavioral disturbance: Secondary | ICD-10-CM

## 2018-12-29 DIAGNOSIS — E782 Mixed hyperlipidemia: Secondary | ICD-10-CM | POA: Diagnosis not present

## 2018-12-29 MED ORDER — AZELASTINE HCL 0.1 % NA SOLN: NASAL | 3 refills | Status: DC

## 2018-12-29 MED ORDER — FLUTICASONE PROPIONATE 50 MCG/ACT NA SUSP: NASAL | 3 refills | Status: DC

## 2018-12-29 MED ORDER — DICLOFENAC SODIUM 1 % TD GEL: TRANSDERMAL | 3 refills | Status: DC

## 2018-12-30 LAB — COMPLETE METABOLIC PANEL WITH GFR
AG Ratio: 1.9 (calc) (ref 1.0–2.5)
ALT: 25 U/L (ref 9–46)
AST: 19 U/L (ref 10–35)
Albumin: 3.9 g/dL (ref 3.6–5.1)
Alkaline phosphatase (APISO): 62 U/L (ref 35–144)
BUN: 12 mg/dL (ref 7–25)
CO2: 29 mmol/L (ref 20–32)
Calcium: 9.6 mg/dL (ref 8.6–10.3)
Chloride: 104 mmol/L (ref 98–110)
Creat: 0.77 mg/dL (ref 0.70–1.33)
GFR, Est African American: 116 mL/min/{1.73_m2} (ref >=60)
GFR, Est Non African American: 100 mL/min/{1.73_m2} (ref >=60)
Globulin: 2.1 g/dL (calc) (ref 1.9–3.7)
Glucose, Bld: 100 mg/dL — ABNORMAL HIGH (ref 65–99)
Potassium: 4.2 mmol/L (ref 3.5–5.3)
Sodium: 141 mmol/L (ref 135–146)
Total Bilirubin: 0.9 mg/dL (ref 0.2–1.2)
Total Protein: 6 g/dL — ABNORMAL LOW (ref 6.1–8.1)

## 2018-12-30 LAB — URINALYSIS, ROUTINE W REFLEX MICROSCOPIC
Bilirubin Urine: NEGATIVE
Glucose, UA: NEGATIVE
Hgb urine dipstick: NEGATIVE
Ketones, ur: NEGATIVE
Leukocytes,Ua: NEGATIVE
Nitrite: NEGATIVE
Protein, ur: NEGATIVE
Specific Gravity, Urine: 1.005 (ref 1.001–1.03)
pH: 5.5 (ref 5.0–8.0)

## 2018-12-30 LAB — LIPID PANEL
Cholesterol: 134 mg/dL (ref <200)
HDL: 32 mg/dL — ABNORMAL LOW (ref >=40)
LDL Cholesterol (Calc): 69 mg/dL (calc)
Non-HDL Cholesterol (Calc): 102 mg/dL (calc) (ref <130)
Total CHOL/HDL Ratio: 4.2 (calc) (ref <5.0)
Triglycerides: 249 mg/dL — ABNORMAL HIGH (ref <150)

## 2018-12-30 LAB — PSA: PSA: 0.3 ng/mL (ref <=4.0)

## 2018-12-30 LAB — CBC WITH DIFFERENTIAL/PLATELET
Absolute Monocytes: 955 cells/uL — ABNORMAL HIGH (ref 200–950)
Basophils Absolute: 67 cells/uL (ref 0–200)
Basophils Relative: 0.6 %
Eosinophils Absolute: 167 cells/uL (ref 15–500)
Eosinophils Relative: 1.5 %
HCT: 43.1 % (ref 38.5–50.0)
Hemoglobin: 14.8 g/dL (ref 13.2–17.1)
Lymphs Abs: 2942 cells/uL (ref 850–3900)
MCH: 30.6 pg (ref 27.0–33.0)
MCHC: 34.3 g/dL (ref 32.0–36.0)
MCV: 89.2 fL (ref 80.0–100.0)
MPV: 11.1 fL (ref 7.5–12.5)
Monocytes Relative: 8.6 %
Neutro Abs: 6971 cells/uL (ref 1500–7800)
Neutrophils Relative %: 62.8 %
Platelets: 254 10*3/uL (ref 140–400)
RBC: 4.83 10*6/uL (ref 4.20–5.80)
RDW: 12.6 % (ref 11.0–15.0)
Total Lymphocyte: 26.5 %
WBC: 11.1 10*3/uL — ABNORMAL HIGH (ref 3.8–10.8)

## 2018-12-30 LAB — IRON, TOTAL/TOTAL IRON BINDING CAP
%SAT: 14 % (calc) — ABNORMAL LOW (ref 20–48)
Iron: 37 ug/dL — ABNORMAL LOW (ref 50–180)
TIBC: 270 mcg/dL (calc) (ref 250–425)

## 2018-12-30 LAB — HEMOGLOBIN A1C
Hgb A1c MFr Bld: 5.3 % of total Hgb (ref <5.7)
Mean Plasma Glucose: 105 (calc)
eAG (mmol/L): 5.8 (calc)

## 2018-12-30 LAB — VITAMIN D 25 HYDROXY (VIT D DEFICIENCY, FRACTURES): Vit D, 25-Hydroxy: 128 ng/mL — ABNORMAL HIGH (ref 30–100)

## 2018-12-30 LAB — MICROALBUMIN / CREATININE URINE RATIO
Creatinine, Urine: 29 mg/dL (ref 20–320)
Microalb, Ur: <0.2 mg/dL

## 2018-12-30 LAB — VITAMIN B12: Vitamin B-12: 1829 pg/mL — ABNORMAL HIGH (ref 200–1100)

## 2018-12-30 LAB — TESTOSTERONE: Testosterone: 318 ng/dL (ref 250–827)

## 2018-12-30 LAB — INSULIN, RANDOM: Insulin: 21.8 u[IU]/mL — ABNORMAL HIGH

## 2018-12-30 LAB — TSH: TSH: 1.33 mIU/L (ref 0.40–4.50)

## 2018-12-30 LAB — MAGNESIUM: Magnesium: 1.8 mg/dL (ref 1.5–2.5)

## 2019-01-05 ENCOUNTER — Telehealth: Payer: Self-pay | Admitting: Radiology

## 2019-01-05 ENCOUNTER — Telehealth: Payer: Self-pay | Admitting: *Deleted

## 2019-01-05 NOTE — Telephone Encounter (Signed)
A message was left to inform the patient his Diclofenac gel had been denied by his insurance company.  Per [facility], he can try the OTC Zoltaren or use the GoodRX app to get a reasonable price for the gel. Per Dr Melford Aase, he can also try DMSO linemint .

## 2019-01-05 NOTE — Telephone Encounter (Signed)
I called patient to let him know that his MRI 's were denied and that per the ins comp. He needs PT first. I scheduled him with Dr. [name] for 01/20/2019 @ 930

## 2019-01-06 ENCOUNTER — Ambulatory Visit: Payer: Self-pay

## 2019-01-06 ENCOUNTER — Telehealth: Payer: Self-pay | Admitting: Surgery

## 2019-01-06 ENCOUNTER — Ambulatory Visit (INDEPENDENT_AMBULATORY_CARE_PROVIDER_SITE_OTHER): Payer: BC Managed Care – PPO | Admitting: Surgery

## 2019-01-06 ENCOUNTER — Encounter: Payer: Self-pay | Admitting: Surgery

## 2019-01-06 ENCOUNTER — Other Ambulatory Visit: Payer: Self-pay

## 2019-01-06 VITALS — BP 124/87 | HR 82 | Ht 72.0 in | Wt 350.0 lb

## 2019-01-06 DIAGNOSIS — M79645 Pain in left finger(s): Secondary | ICD-10-CM

## 2019-01-06 DIAGNOSIS — M19032 Primary osteoarthritis, left wrist: Secondary | ICD-10-CM

## 2019-01-06 DIAGNOSIS — M5136 Other intervertebral disc degeneration, lumbar region: Secondary | ICD-10-CM

## 2019-01-06 DIAGNOSIS — G8929 Other chronic pain: Secondary | ICD-10-CM

## 2019-01-06 DIAGNOSIS — M4802 Spinal stenosis, cervical region: Secondary | ICD-10-CM
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
## 2019-01-06 NOTE — Telephone Encounter (Signed)
I called patient and advised PT should call him to schedule.

## 2019-01-06 NOTE — Progress Notes (Signed)
Office Visit Note   Patient: Mark Harrell           Date of Birth: 05/01/1961           MRN: [id number] Visit Date: 01/06/2019              Requested by: [name], [address],  [place] [address] PCP: [name], MD   Assessment & Plan: Visit Diagnoses:  1. Chronic pain of left thumb   2. Localized primary osteoarthrosis of carpometacarpal joint of left wrist     Plan: With patient's localized pain to the left thumb CMC joint recommend try an injection.  I asked Dr. Junius Roads to perform a ultrasound-guided [facility] intra-articular Marcaine/Depo-Medrol injection and he agreed.  Patient will follow-up in 6 weeks with Dr. [name] after he has had formal PT for his neck and lumbar spine.  Patient's insurance company denied the MRI of the cervical and lumbar spines that I previously ordered.  All questions answered.  Follow-Up Instructions: Return in about 6 weeks (around 02/17/2019) for With Dr. [name] for recheck after having formal PT.   Orders:  Orders Placed This Encounter  Procedures  . XR Finger Thumb Left   No orders of the defined types were placed in this encounter.     Procedures: No procedures performed   Clinical Data: No additional findings.   Subjective: Chief Complaint  Patient presents with  . Left Thumb - Pain, Follow-up    HPI 58 year old white male returns with complaints of left thumb pain.  I discussed this with patient at his last office visit with me.  Last office visit I also scheduled cervical and lumbar MRI scans but his insurance company denied.  Stated that he needed at least 6 weeks of formal PT.  Patient has had known history of cervical and lumbar spine issues.  This is all been well documented in previous office notes.  Patient has had cervical MRI scan April 17, 2018 along with lumbar MRI April 11, 2018.  Patient is also had previous CT lumbar spine March 25, 2018.  Localizes thumb pain to the [facility] joint.   Pain when he is gripping objects.  He was given a removable thumb spica splint last office visit and swelling of the thumb has caused some pressure areas in the distal thumb.  States that he was seen by his primary care physician and there was some concern that he might have a possible infection.  Objective: Vital Signs: BP 124/87   Pulse 82   Ht 6' (1.829 m)   Wt (!) 350 lb (158.8 kg)   BMI 47.47 kg/m   Physical Exam Pleasant morbidly obese white male alert and oriented in no acute distress.  Upon examination of his left hand he does have some swelling of his thumb.  There are no signs of infection.  He has moderate to markedly tender at the thumb [facility] joint.  Positive CMC grind.  No bony deformity. Ortho Exam  Specialty Comments:  No specialty comments available.  Imaging: No results found.   PMFS History: Patient Active Problem List   Diagnosis Date Noted  . Chronic respiratory failure with hypoxia ([place]) 04/12/2018  . Bilateral lower extremity edema 12/29/2017  . Atypical chest pain 12/29/2017  . Hymenoptera allergy 11/10/2017  . Diverticulosis 08/07/2017  . Family history of colonic polyps 08/07/2017  . Flatus 08/07/2017  . Myofascial pain 08/06/2017  . Seasonal and perennial allergic rhinitis 04/23/2017  .  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 Cervicalgia 04/01/2017  . Cervical spondylosis with radiculopathy 02/09/2017  . Memory difficulty 12/05/2016  . Morbid obesity ([place]) 12/05/2016  . Diarrhea in adult patient 12/03/2016  . Internal hemorrhoids 12/03/2016  . Headache 11/13/2016  . Migraine without aura and without status migrainosus, not intractable 10/30/2016  . Irritant contact dermatitis due to frequent handwashing 06/08/2016  . Lumbar radiculopathy 05/15/2016  . Osteoarthritis of spine with radiculopathy, lumbar region 05/15/2016  . Risk for falls 05/15/2016  . Acute medial meniscal tear, left, subsequent encounter 04/23/2016  . Recurrent infections 03/29/2016   . Chronic nonseasonal allergic rhinitis due to pollen 03/29/2016  . Polypharmacy 01/16/2016  . Morbid obesity with BMI of 50.0-59.9, adult ([place]) 03/06/2015  . SDAT 02/05/2015  . Asthma 02/05/2015  . OSA and COPD overlap syndrome ([place]) 02/05/2015  . Medication management 08/02/2014  . GERD (gastroesophageal reflux disease) 05/09/2014  . Vitamin D deficiency 08/01/2013  . Prediabetes 08/01/2013  . 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 Diverticula of colon 05/07/2011  . Hypertension 01/31/2011  . Hyperlipidemia, mixed 01/31/2011  . BPH (benign prostatic hyperplasia) 01/31/2011  . Testosterone Deficiency 01/31/2011  . IBS (irritable bowel syndrome) 01/31/2011  . Partial complex seizure disorder with intractable epilepsy ([place]) 01/31/2011  . Depression, major, recurrent, in partial remission ([place]) 01/31/2011  . Asthma-COPD overlap syndrome ([place]) 01/31/2011   Past Medical History:  Diagnosis Date  . Anesthesia complication requiring reversal agent administration    ? from central apnea, very difficult to get off vent  . Anxiety   . Arthritis    osteo  . Asthma   . BPH (benign prostatic hyperplasia)   . Complication of anesthesia    difficulty waking , they twlight me because of my respiratory problems "  . Dyspnea   . Enlarged heart   . Family history of adverse reaction to anesthesia    mother trouble waking up, and heart stopped  . GERD (gastroesophageal reflux disease)   . Headache    botox injections for headaches  . Hyperlipidemia   . Hypertension   . Hypogonadism male   . IBS (irritable bowel syndrome)   . Obesity   . OSA (obstructive sleep apnea)    cpap  . Pneumonia   . Pre-diabetes   . Prostatitis     Family History  Problem Relation Age of Onset  . Diabetes Paternal Uncle   . Cancer Father        lymphoma, colon  . Diabetes Maternal Grandmother   . Heart disease Maternal Grandfather   . Diabetes Maternal Grandfather   . Diabetes Paternal Grandmother   .  Diabetes Paternal Grandfather   . Dementia Mother   . Prostate cancer Maternal Uncle   . Lung disease Neg Hx   . Rheumatologic disease Neg Hx     Past Surgical History:  Procedure Laterality Date  . ABDOMINAL SURGERY    . ANKLE FRACTURE SURGERY Right   . CYSTOSCOPY     [place]  . KNEE ARTHROSCOPY WITH MEDIAL MENISECTOMY Left 01/02/2017   Procedure: LEFT KNEE ARTHROSCOPY WITH PARTIAL MEDIAL MENISCECTOMY;  Surgeon: Mcarthur Rossetti, MD;  Location: [facility];  Service: Orthopedics;  Laterality: Left;  . TONSILLECTOMY    . [place] RESECTION  2007  . UVULOPALATOPHARYNGOPLASTY     Social History   Occupational History  . Occupation: Dance movement psychotherapist  Tobacco Use  . Smoking status: Former Smoker    Packs/day: 0.10    Years: 15.00    Pack  years: 1.50  . Smokeless tobacco: Never Used  . Tobacco comment: significant second-hand exposure through mother  Substance and Sexual Activity  . Alcohol use: Yes    Alcohol/week: 1.0 standard drinks    Types: 1 Glasses of wine per week    Comment: 2 x a year  . Drug use: No  . Sexual activity: Never

## 2019-01-06 NOTE — Telephone Encounter (Signed)
Patient called asked if someone will contact him to set up (PT)? The number to contact patient is [phone]

## 2019-01-12 ENCOUNTER — Ambulatory Visit: Payer: BC Managed Care – PPO | Admitting: Specialist

## 2019-01-14 ENCOUNTER — Other Ambulatory Visit: Payer: Self-pay | Admitting: Allergy & Immunology

## 2019-01-14 NOTE — Telephone Encounter (Signed)
Ok to refill 

## 2019-01-15 DIAGNOSIS — J9611 Chronic respiratory failure with hypoxia: Secondary | ICD-10-CM | POA: Diagnosis not present

## 2019-01-15 DIAGNOSIS — J449 Chronic obstructive pulmonary disease, unspecified: Secondary | ICD-10-CM | POA: Diagnosis not present

## 2019-01-18 ENCOUNTER — Ambulatory Visit: Payer: BC Managed Care – PPO | Attending: Surgery | Admitting: Physical Therapy

## 2019-01-18 ENCOUNTER — Other Ambulatory Visit: Payer: Self-pay

## 2019-01-18 DIAGNOSIS — M6281 Muscle weakness (generalized): Secondary | ICD-10-CM | POA: Insufficient documentation

## 2019-01-18 DIAGNOSIS — R29898 Other symptoms and signs involving the musculoskeletal system: Secondary | ICD-10-CM

## 2019-01-18 DIAGNOSIS — M79609 Pain in unspecified limb: Secondary | ICD-10-CM

## 2019-01-18 NOTE — Therapy (Signed)
[place], [place], [address] Phone: [phone]   Fax:  [phone]  Physical Therapy Evaluation  Patient Details  Name: Mark Harrell MRN: [id number] Date of Birth: 08/13/1960 Referring Provider (PT): Mack Guise PA-C [name]   Encounter Date: 01/18/2019  PT End of Session - 01/18/19 1454    Visit Number  1    Number of Visits  8    Date for PT Re-Evaluation  02/15/19    Authorization Type  [employer]    PT Start Time  [id number]    PT Stop Time  1533    PT Time Calculation (min)  39 min    Activity Tolerance  Patient tolerated treatment well    Behavior During Therapy  [facility] for tasks assessed/performed       Past Medical History:  Diagnosis Date  . Anesthesia complication requiring reversal agent administration    ? from central apnea, very difficult to get off vent  . Anxiety   . Arthritis    osteo  . Asthma   . BPH (benign prostatic hyperplasia)   . Complication of anesthesia    difficulty waking , they twlight me because of my respiratory problems "  . Dyspnea   . Enlarged heart   . Family history of adverse reaction to anesthesia    mother trouble waking up, and heart stopped  . GERD (gastroesophageal reflux disease)   . Headache    botox injections for headaches  . Hyperlipidemia   . Hypertension   . Hypogonadism male   . IBS (irritable bowel syndrome)   . Obesity   . OSA (obstructive sleep apnea)    cpap  . Pneumonia   . Pre-diabetes   . Prostatitis     Past Surgical History:  Procedure Laterality Date  . ABDOMINAL SURGERY    . ANKLE FRACTURE SURGERY Right   . CYSTOSCOPY     [place]  . KNEE ARTHROSCOPY WITH MEDIAL MENISECTOMY Left 01/02/2017   Procedure: LEFT KNEE ARTHROSCOPY WITH PARTIAL MEDIAL MENISCECTOMY;  Surgeon: Mcarthur Rossetti, MD;  Location: [facility];  Service: Orthopedics;  Laterality: Left;  . TONSILLECTOMY    . [place] RESECTION  2007  . UVULOPALATOPHARYNGOPLASTY       There were no vitals filed for this visit.   Subjective Assessment - 01/18/19 1456    Subjective  Pt reports he fell last halloween and injured his back and neck.  Had MRI and was told he needed cervical surgery and possible lumbar.  PW issue delayed all this and then covid and no elective surgeries.  Now MD wants new MRI and insurance said no until he tries PT first.    How long can you sit comfortably?  best position    How long can you stand comfortably?  limited less than meal prepartions    How long can you walk comfortably?  varies depending level of pain, uses a cane most of the time.    Diagnostic tests  old MRI and old xrays    Patient Stated Goals  if possible get better, if not have enough to get MRI approved.  Learning what is safe to do.    Currently in Pain?  Yes    Pain Score  6     Pain Location  Back    Pain Orientation  Lower    Pain Descriptors / Indicators  Pounding;Other (Comment);Numbness;Jabbing    Pain Type  Chronic pain    Pain Radiating  Towards  numbness into Rt LE    Pain Onset  More than a month ago    Pain Frequency  Constant    Aggravating Factors   everything    Pain Relieving Factors  nothing    Multiple Pain Sites  Yes    Pain Score  4    Pain Location  Neck    Pain Orientation  Medial    Pain Descriptors / Indicators  Pins and needles;Numbness;Aching;Jabbing    Pain Type  Chronic pain    Pain Radiating Towards  into Left hand and arm    Pain Onset  More than a month ago    Pain Frequency  Constant    Aggravating Factors   everything especially lying down trying to sleep    Pain Relieving Factors  nothing         [facility] PT Assessment - 01/18/19 0001      Assessment   Medical Diagnosis  cervical stenosis and lumbar DDD    Referring Provider (PT)  Mack Guise PA-C [name]    Onset Date/Surgical Date  03/25/18    Hand Dominance  --   uses both   Next MD Visit  02/17/2019    Prior Therapy  in 201[address] for lumbar      Precautions    Precautions  Other (comment)    Precaution Comments  limit lifting and be very careful not to fall.       Balance Screen   Has the patient fallen in the past 6 months  No    Has the patient had a decrease in activity level because of a fear of falling?   Yes    Is the patient reluctant to leave their home because of a fear of falling?   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    Leisure  reading and watching TV      Posture/Postural Control   Posture/Postural Control  Postural limitations    Postural Limitations  Rounded Shoulders;Forward head;Flexed trunk   obesity      ROM / Strength   AROM / PROM / Strength  AROM;Strength      AROM   AROM Assessment Site  Shoulder;Elbow;Cervical;Lumbar;Hip    Right/Left Shoulder  --   limited in all overhead motions with pain in all motions    Right/Left Elbow  --   WNL   Right/Left Hip  --   WNL   Cervical Flexion  90% present with pain    Cervical Extension  20    Cervical - Right Rotation  50    Cervical - Left Rotation  35    Lumbar Flexion  just below knees, bialt UE assist to return to stand    Lumbar Extension  to neutral  with a lot of pain    Lumbar - Right Rotation  25% present with pain    Lumbar - Left Rotation  50% present with pain      Strength   Strength Assessment Site  Shoulder;Elbow;Hand;Hip;Knee;Ankle    Right/Left Shoulder  --   grossly 4-/5 with pain   Right/Left Elbow  --  grossly 4/5 with pain   Right/Left hand  --   weak bilat however equal.    Right/Left Hip  --   grossly 4+/5,   Right/Left Knee  --   grossly 4+/5   Right/Left Ankle  --   WNL     Palpation   Spinal mobility  NA at this time.     Palpation comment  NA at this  time                Objective measurements completed on examination: See above findings.      [facility] Adult PT Treatment/Exercise - 01/18/19 0001      Exercises   Exercises  Other Exercises    Other Exercises   cervical retraction , bialt shoulder ER with read band, TA contractions isometrics in seated with ball./pillows             PT Education - 01/18/19 1633    Education Details  HEP and POC    Person(s) Educated  Patient    Methods  Explanation;Demonstration;Handout    Comprehension  Returned demonstration;Verbalized understanding          PT Long Term Goals - 01/18/19 1641      PT LONG TERM GOAL #1   Title  Patient demonstrates & verbalizes understanding of on-going fitness plan / HEP.  (Target Date: 02/15/2019)    Time  4    Period  Weeks    Status  New    Target Date  02/15/19      PT LONG TERM GOAL #2   Title  improve functional ROM of bilat shoulders to allow ease in performing IADLs ( 02/15/2019)    Time  4    Period  Weeks    Status  New    Target Date  02/15/19      PT LONG TERM GOAL #3   Title  report =/> 50% reduction of pain with daily activities. ( 02/15/2019)    Time  4    Period  Weeks    Status  New    Target Date  02/15/19      PT LONG TERM GOAL #4   Title  ambulate in the community with no more than 4/10 pain ( 02/15/2019)    Time  4    Period  Weeks    Status  New    Target Date  02/15/19             Plan - 01/18/19 1546    Clinical Impression Statement  58 yo male with long h/o of intermittent back pain.  He had PT 4 years ago for lumbar pain with fair results.  He fell this past October and has had sginificant symptoms in his neck and low back since.  MDs are recommending another MRI, insurance wants him to try PT first.  He has decreased ROM in his neck, shoulders and lumbar.  Hips limited d/t soft tissue approximation. He has significant weakness in bilat UEs and slight weakness in his hips/  Would benefit from trial  of PT to work on improving strength to help decrease pain.    Personal Factors and Comorbidities  Behavior Pattern;Past/Current Experience;Comorbidity 3+    Examination-Activity Limitations  Sleep    Examination-Participation Restrictions  Other    Stability/Clinical Decision Making  Evolving/Moderate complexity    Clinical Decision Making  Moderate    Rehab Potential  Fair    PT Frequency  2x / week  PT Duration  4 weeks    PT Treatment/Interventions  Patient/family education;Moist Heat;Passive range of motion;Therapeutic exercise;Ultrasound;Traction;Cryotherapy;Electrical Stimulation;Manual techniques;Dry needling;Spinal Manipulations    PT Next Visit Plan  any core, upper body and hip strengthening    Consulted and Agree with Plan of Care  Patient       Patient will benefit from skilled therapeutic intervention in order to improve the following deficits and impairments:  Decreased range of motion, Obesity, Impaired UE functional use, Pain, Decreased strength, Postural dysfunction  Visit Diagnosis: Muscle weakness (generalized) - Plan: PT plan of care cert/re-cert  Pain in extremity, unspecified extremity - Plan: PT plan of care cert/re-cert  Other symptoms and signs involving the musculoskeletal system - Plan: PT plan of care cert/re-cert     Problem List Patient Active Problem List   Diagnosis Date Noted  . Chronic respiratory failure with hypoxia ([place]) 04/12/2018  . Bilateral lower extremity edema 12/29/2017  . Atypical chest pain 12/29/2017  . Hymenoptera allergy 11/10/2017  . Diverticulosis 08/07/2017  . Family history of colonic polyps 08/07/2017  . Flatus 08/07/2017  . Myofascial pain 08/06/2017  . Seasonal and perennial allergic rhinitis 04/23/2017  . 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 Cervicalgia 04/01/2017  . Cervical spondylosis with radiculopathy 02/09/2017  . Memory difficulty 12/05/2016  . Morbid obesity ([place]) 12/05/2016  . Diarrhea in adult patient  12/03/2016  . Internal hemorrhoids 12/03/2016  . Headache 11/13/2016  . Migraine without aura and without status migrainosus, not intractable 10/30/2016  . Irritant contact dermatitis due to frequent handwashing 06/08/2016  . Lumbar radiculopathy 05/15/2016  . Osteoarthritis of spine with radiculopathy, lumbar region 05/15/2016  . Risk for falls 05/15/2016  . Acute medial meniscal tear, left, subsequent encounter 04/23/2016  . Recurrent infections 03/29/2016  . Chronic nonseasonal allergic rhinitis due to pollen 03/29/2016  . Polypharmacy 01/16/2016  . Morbid obesity with BMI of 50.0-59.9, adult ([place]) 03/06/2015  . SDAT 02/05/2015  . Asthma 02/05/2015  . OSA and COPD overlap syndrome ([place]) 02/05/2015  . Medication management 08/02/2014  . GERD (gastroesophageal reflux disease) 05/09/2014  . Vitamin D deficiency 08/01/2013  . Prediabetes 08/01/2013  . 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 Diverticula of colon 05/07/2011  . Hypertension 01/31/2011  . Hyperlipidemia, mixed 01/31/2011  . BPH (benign prostatic hyperplasia) 01/31/2011  . Testosterone Deficiency 01/31/2011  . IBS (irritable bowel syndrome) 01/31/2011  . Partial complex seizure disorder with intractable epilepsy ([place]) 01/31/2011  . Depression, major, recurrent, in partial remission ([place]) 01/31/2011  . Asthma-COPD overlap syndrome ([place]) 01/31/2011    SusanShaver PT  01/18/2019, 4:51 PM  [facility] [address] [place], [place], [address] Phone: [phone]   Fax:  [phone]  Name: Mark Harrell MRN: [id number] Date of Birth: 02-Jan-1961

## 2019-01-19 ENCOUNTER — Encounter: Payer: Self-pay | Admitting: Physical Therapy

## 2019-01-19 ENCOUNTER — Ambulatory Visit: Payer: BC Managed Care – PPO | Admitting: Physical Therapy

## 2019-01-19 DIAGNOSIS — M79609 Pain in unspecified limb: Secondary | ICD-10-CM

## 2019-01-19 DIAGNOSIS — R29898 Other symptoms and signs involving the musculoskeletal system: Secondary | ICD-10-CM

## 2019-01-19 DIAGNOSIS — G4733 Obstructive sleep apnea (adult) (pediatric): Secondary | ICD-10-CM | POA: Diagnosis not present

## 2019-01-19 DIAGNOSIS — M6281 Muscle weakness (generalized): Secondary | ICD-10-CM | POA: Diagnosis not present

## 2019-01-19 DIAGNOSIS — J449 Chronic obstructive pulmonary disease, unspecified: Secondary | ICD-10-CM | POA: Diagnosis not present

## 2019-01-19 DIAGNOSIS — R069 Unspecified abnormalities of breathing: Secondary | ICD-10-CM | POA: Diagnosis not present

## 2019-01-19 NOTE — Therapy (Signed)
[place], [place], [address] Phone: [phone]   Fax:  [phone]  Physical Therapy Treatment  Patient Details  Name: Mark Harrell MRN: [id number] Date of Birth: Jun 02, 1960 Referring Provider (PT): Mack Guise PA-C [name]   Encounter Date: 01/19/2019  PT End of Session - 01/19/19 1704    Visit Number  2    Number of Visits  8    Date for PT Re-Evaluation  02/15/19    Authorization Type  BCBS    PT Start Time  1702    PT Stop Time  1742    PT Time Calculation (min)  40 min    Activity Tolerance  Patient limited by pain    Behavior During Therapy  [facility] for tasks assessed/performed       Past Medical History:  Diagnosis Date  . Anesthesia complication requiring reversal agent administration    ? from central apnea, very difficult to get off vent  . Anxiety   . Arthritis    osteo  . Asthma   . BPH (benign prostatic hyperplasia)   . Complication of anesthesia    difficulty waking , they twlight me because of my respiratory problems "  . Dyspnea   . Enlarged heart   . Family history of adverse reaction to anesthesia    mother trouble waking up, and heart stopped  . GERD (gastroesophageal reflux disease)   . Headache    botox injections for headaches  . Hyperlipidemia   . Hypertension   . Hypogonadism male   . IBS (irritable bowel syndrome)   . Obesity   . OSA (obstructive sleep apnea)    cpap  . Pneumonia   . Pre-diabetes   . Prostatitis     Past Surgical History:  Procedure Laterality Date  . ABDOMINAL SURGERY    . ANKLE FRACTURE SURGERY Right   . CYSTOSCOPY     [place]  . KNEE ARTHROSCOPY WITH MEDIAL MENISECTOMY Left 01/02/2017   Procedure: LEFT KNEE ARTHROSCOPY WITH PARTIAL MEDIAL MENISCECTOMY;  Surgeon: Mcarthur Rossetti, MD;  Location: [facility];  Service: Orthopedics;  Laterality: Left;  . TONSILLECTOMY    . [place] RESECTION  2007  . UVULOPALATOPHARYNGOPLASTY       There were no vitals filed for this visit.  Subjective Assessment - 01/19/19 1705    Subjective  Feeling neck and low back, fairly even. Uses a SPC 75% of the time. sitting is the least uncomfortable. usually on 2L of oxygen.    How long can you sit comfortably?  every few minutes I shift a little and get up after about 20-30 min    Patient Stated Goals  if possible get better, if not have enough to get MRI approved.  Learning what is safe to do.    Currently in Pain?  Yes    Pain Score  6     Pain Location  --   back and neck   Pain Descriptors / Indicators  Pounding;Radiating;Sharp;Dull;Throbbing    Pain Radiating Towards  Lt arm and hand mostly                       [facility] Adult PT Treatment/Exercise - 01/19/19 0001      Exercises   Exercises  Lumbar;Shoulder      Lumbar Exercises: Aerobic   Nustep  5 min L4 UE & LE      Lumbar Exercises: Supine   Other Supine Lumbar  Exercises  belly breathing    Other Supine Lumbar Exercises  decompression series             PT Education - 01/19/19 1744    Education Details  see plan    Person(s) Educated  Patient    Methods  Explanation;Demonstration;Verbal cues    Comprehension  Verbalized understanding;Returned demonstration;Verbal cues required;Need further instruction          PT Long Term Goals - 01/18/19 1641      PT LONG TERM GOAL #1   Title  Patient demonstrates & verbalizes understanding of on-going fitness plan / HEP.  (Target Date: 02/15/2019)    Time  4    Period  Weeks    Status  New    Target Date  02/15/19      PT LONG TERM GOAL #2   Title  improve functional ROM of bilat shoulders to allow ease in performing IADLs ( 02/15/2019)    Time  4    Period  Weeks    Status  New    Target Date  02/15/19      PT LONG TERM GOAL #3   Title  report =/> 50% reduction of pain with daily activities. ( 02/15/2019)    Time  4    Period  Weeks    Status  New    Target Date  02/15/19      PT LONG  TERM GOAL #4   Title  ambulate in the community with no more than 4/10 pain ( 02/15/2019)    Time  4    Period  Weeks    Status  New    Target Date  02/15/19            Plan - 01/19/19 1742    Clinical Impression Statement  Heavy emphasis on education of chronic pain and neurological aspects. Severe pain with decompression exercises. We discussed focusing on muscles that are working and thinking more about his body in anatomical terms rather than the pain. we utilized contract/relax of muscle groups along spine to reinforce. Pt verbalized understanding.    PT Treatment/Interventions  Patient/family education;Moist Heat;Passive range of motion;Therapeutic exercise;Ultrasound;Traction;Cryotherapy;Electrical Stimulation;Manual techniques;Dry needling;Spinal Manipulations    PT Next Visit Plan  progress from contract/relax, large motions with education of muscle activation    PT Home Exercise Plan  decompression series    Consulted and Agree with Plan of Care  Patient       Patient will benefit from skilled therapeutic intervention in order to improve the following deficits and impairments:  Decreased range of motion, Obesity, Impaired UE functional use, Pain, Decreased strength, Postural dysfunction  Visit Diagnosis: Muscle weakness (generalized)  Pain in extremity, unspecified extremity  Other symptoms and signs involving the musculoskeletal system     Problem List Patient Active Problem List   Diagnosis Date Noted  . Chronic respiratory failure with hypoxia ([place]) 04/12/2018  . Bilateral lower extremity edema 12/29/2017  . Atypical chest pain 12/29/2017  . Hymenoptera allergy 11/10/2017  . Diverticulosis 08/07/2017  . Family history of colonic polyps 08/07/2017  . Flatus 08/07/2017  . Myofascial pain 08/06/2017  . Seasonal and perennial allergic rhinitis 04/23/2017  . 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 Cervicalgia 04/01/2017  . Cervical spondylosis with radiculopathy  02/09/2017  . Memory difficulty 12/05/2016  . Morbid obesity ([place]) 12/05/2016  . Diarrhea in adult patient 12/03/2016  . Internal hemorrhoids 12/03/2016  . Headache 11/13/2016  . Migraine without aura and without status migrainosus,  not intractable 10/30/2016  . Irritant contact dermatitis due to frequent handwashing 06/08/2016  . Lumbar radiculopathy 05/15/2016  . Osteoarthritis of spine with radiculopathy, lumbar region 05/15/2016  . Risk for falls 05/15/2016  . Acute medial meniscal tear, left, subsequent encounter 04/23/2016  . Recurrent infections 03/29/2016  . Chronic nonseasonal allergic rhinitis due to pollen 03/29/2016  . Polypharmacy 01/16/2016  . Morbid obesity with BMI of 50.0-59.9, adult ([place]) 03/06/2015  . SDAT 02/05/2015  . Asthma 02/05/2015  . OSA and COPD overlap syndrome ([place]) 02/05/2015  . Medication management 08/02/2014  . GERD (gastroesophageal reflux disease) 05/09/2014  . Vitamin D deficiency 08/01/2013  . Prediabetes 08/01/2013  . 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 Diverticula of colon 05/07/2011  . Hypertension 01/31/2011  . Hyperlipidemia, mixed 01/31/2011  . BPH (benign prostatic hyperplasia) 01/31/2011  . Testosterone Deficiency 01/31/2011  . IBS (irritable bowel syndrome) 01/31/2011  . Partial complex seizure disorder with intractable epilepsy ([place]) 01/31/2011  . Depression, major, recurrent, in partial remission ([place]) 01/31/2011  . Asthma-COPD overlap syndrome ([place]) 01/31/2011    Stepheni Cameron C. Kristeen Lantz PT, DPT 01/19/19 5:45 PM   [facility] [address] [place], [place], [address] Phone: [phone]   Fax:  [phone]  Name: AIDON HERGENRADER MRN: [id number] Date of Birth: 02/19/1961

## 2019-01-20 ENCOUNTER — Ambulatory Visit: Payer: BC Managed Care – PPO | Admitting: Specialist

## 2019-01-22 ENCOUNTER — Other Ambulatory Visit: Payer: BC Managed Care – PPO

## 2019-01-25 ENCOUNTER — Other Ambulatory Visit: Payer: Self-pay | Admitting: Physician Assistant

## 2019-01-27 ENCOUNTER — Other Ambulatory Visit: Payer: Self-pay

## 2019-01-27 ENCOUNTER — Other Ambulatory Visit: Payer: BC Managed Care – PPO

## 2019-01-27 ENCOUNTER — Encounter: Payer: Self-pay | Admitting: Physical Therapy

## 2019-01-27 ENCOUNTER — Ambulatory Visit: Payer: BC Managed Care – PPO | Attending: Surgery | Admitting: Physical Therapy

## 2019-01-27 DIAGNOSIS — M6281 Muscle weakness (generalized): Secondary | ICD-10-CM | POA: Diagnosis not present

## 2019-01-27 DIAGNOSIS — M79609 Pain in unspecified limb: Secondary | ICD-10-CM | POA: Diagnosis not present

## 2019-01-27 DIAGNOSIS — R29898 Other symptoms and signs involving the musculoskeletal system: Secondary | ICD-10-CM

## 2019-01-27 NOTE — Therapy (Signed)
[place], [place], [address] Phone: [phone]   Fax:  [phone]  Physical Therapy Treatment  Patient Details  Name: Mark Harrell MRN: [id number] Date of Birth: 10-21-1960 Referring Provider (PT): Mack Guise PA-C [name]   Encounter Date: 01/27/2019  PT End of Session - 01/27/19 1414    Visit Number  3    Number of Visits  8    Date for PT Re-Evaluation  02/15/19    Authorization Type  BCBS    PT Start Time  [id number]    PT Stop Time  1407    PT Time Calculation (min)  39 min    Activity Tolerance  Patient limited by pain    Behavior During Therapy  [facility] for tasks assessed/performed       Past Medical History:  Diagnosis Date  . Anesthesia complication requiring reversal agent administration    ? from central apnea, very difficult to get off vent  . Anxiety   . Arthritis    osteo  . Asthma   . BPH (benign prostatic hyperplasia)   . Complication of anesthesia    difficulty waking , they twlight me because of my respiratory problems "  . Dyspnea   . Enlarged heart   . Family history of adverse reaction to anesthesia    mother trouble waking up, and heart stopped  . GERD (gastroesophageal reflux disease)   . Headache    botox injections for headaches  . Hyperlipidemia   . Hypertension   . Hypogonadism male   . IBS (irritable bowel syndrome)   . Obesity   . OSA (obstructive sleep apnea)    cpap  . Pneumonia   . Pre-diabetes   . Prostatitis     Past Surgical History:  Procedure Laterality Date  . ABDOMINAL SURGERY    . ANKLE FRACTURE SURGERY Right   . CYSTOSCOPY     [place]  . KNEE ARTHROSCOPY WITH MEDIAL MENISECTOMY Left 01/02/2017   Procedure: LEFT KNEE ARTHROSCOPY WITH PARTIAL MEDIAL MENISCECTOMY;  Surgeon: Mcarthur Rossetti, MD;  Location: [facility];  Service: Orthopedics;  Laterality: Left;  . TONSILLECTOMY    . [place] RESECTION  2007  . UVULOPALATOPHARYNGOPLASTY       There were no vitals filed for this visit.  Subjective Assessment - 01/27/19 1355    Subjective  Pt. reports continued neck and lumbar pain with left>right UE as well as right>left LE radicular symptoms. Continues to need SPC "about 3/4 of the time" for gait/balance.    Currently in Pain?  Yes    Pain Score  6     Pain Location  --   back and neck   Pain Descriptors / Indicators  Sharp;Radiating    Pain Type  Chronic pain    Pain Radiating Towards  lfet>right arm and hand, right leg    Pain Onset  More than a month ago    Pain Frequency  Intermittent    Aggravating Factors   everything    Pain Relieving Factors  no eases noted                       [facility] Adult PT Treatment/Exercise - 01/27/19 0001      Exercises   Exercises  Neck      Neck Exercises: Supine   Neck Retraction  15 reps    Neck Retraction Limitations  supine with manual assistance    Other Supine Exercise  supine retraction to manually assisted flexion x 15 reps      Lumbar Exercises: Stretches   Lower Trunk Rotation Limitations  manually assisted LTR 5 sec x 10 reps      Lumbar Exercises: Standing   Other Standing Lumbar Exercises  extension in standing with UE support on high table x 10 reps      Lumbar Exercises: Supine   Pelvic Tilt  10 reps      Manual Therapy   Manual Therapy  Joint mobilization;Manual Traction    Joint Mobilization  LAD bilat. hips grade I-IV    Manual Traction  Cervical manual traction, suboccipital release      Neck Exercises: Stretches   Upper Trapezius Stretch  Right;Left;3 reps;20 seconds    Upper Trapezius Stretch Limitations  supine manual stretches    Levator Stretch  Right;Left;3 reps;20 seconds    Levator Stretch Limitations  supine manual stretches             PT Education - 01/27/19 1413    Education Details  spinal anatomy, POC, exercises    Person(s) Educated  Patient    Methods  Explanation    Comprehension  Verbalized understanding           PT Long Term Goals - 01/18/19 1641      PT LONG TERM GOAL #1   Title  Patient demonstrates & verbalizes understanding of on-going fitness plan / HEP.  (Target Date: 02/15/2019)    Time  4    Period  Weeks    Status  New    Target Date  02/15/19      PT LONG TERM GOAL #2   Title  improve functional ROM of bilat shoulders to allow ease in performing IADLs ( 02/15/2019)    Time  4    Period  Weeks    Status  New    Target Date  02/15/19      PT LONG TERM GOAL #3   Title  report =/> 50% reduction of pain with daily activities. ( 02/15/2019)    Time  4    Period  Weeks    Status  New    Target Date  02/15/19      PT LONG TERM GOAL #4   Title  ambulate in the community with no more than 4/10 pain ( 02/15/2019)    Time  4    Period  Weeks    Status  New    Target Date  02/15/19            Plan - 01/27/19 1354    Clinical Impression Statement  Pt. continues with UE and LE radicular symptoms s/p fall injury. Limited exercise tolerance due to pain. Trial flexion bias cervical ROM along with manual traction with mild symptom ease otherwise continues with high pain level. Also trial gentle manual for lumbar/hips with extension bias exercises in standing but nuable to note any centralization of symptoms.    Personal Factors and Comorbidities  Behavior Pattern;Past/Current Experience;Comorbidity 3+    Examination-Activity Limitations  Sleep    Examination-Participation Restrictions  Other    Stability/Clinical Decision Making  Evolving/Moderate complexity    Clinical Decision Making  Moderate    Rehab Potential  Fair    PT Frequency  2x / week    PT Duration  4 weeks    PT Treatment/Interventions  Patient/family education;Moist Heat;Passive range of motion;Therapeutic exercise;Ultrasound;Traction;Cryotherapy;Electrical Stimulation;Manual techniques;Dry needling;Spinal Manipulations    PT Next Visit Plan  progress from contract/relax, large motions with education of muscle  activation    PT Home Exercise Plan  decompression series    Consulted and Agree with Plan of Care  Patient       Patient will benefit from skilled therapeutic intervention in order to improve the following deficits and impairments:  Decreased range of motion, Obesity, Impaired UE functional use, Pain, Decreased strength, Postural dysfunction  Visit Diagnosis: Muscle weakness (generalized)  Pain in extremity, unspecified extremity  Other symptoms and signs involving the musculoskeletal system     Problem List Patient Active Problem List   Diagnosis Date Noted  . Chronic respiratory failure with hypoxia ([place]) 04/12/2018  . Bilateral lower extremity edema 12/29/2017  . Atypical chest pain 12/29/2017  . Hymenoptera allergy 11/10/2017  . Diverticulosis 08/07/2017  . Family history of colonic polyps 08/07/2017  . Flatus 08/07/2017  . Myofascial pain 08/06/2017  . Seasonal and perennial allergic rhinitis 04/23/2017  . 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 Cervicalgia 04/01/2017  . Cervical spondylosis with radiculopathy 02/09/2017  . Memory difficulty 12/05/2016  . Morbid obesity ([employer] [place]) 12/05/2016  . Diarrhea in adult patient 12/03/2016  . Internal hemorrhoids 12/03/2016  . Headache 11/13/2016  . Migraine without aura and without status migrainosus, not intractable 10/30/2016  . Irritant contact dermatitis due to frequent handwashing 06/08/2016  . Lumbar radiculopathy 05/15/2016  . Osteoarthritis of spine with radiculopathy, lumbar region 05/15/2016  . Risk for falls 05/15/2016  . Acute medial meniscal tear, left, subsequent encounter 04/23/2016  . Recurrent infections 03/29/2016  . Chronic nonseasonal allergic rhinitis due to pollen 03/29/2016  . Polypharmacy 01/16/2016  . Morbid obesity with BMI of 50.0-59.9, adult ([place]) 03/06/2015  . SDAT 02/05/2015  . Asthma 02/05/2015  . OSA and COPD overlap syndrome ([place]) 02/05/2015  . Medication management 08/02/2014  . GERD  (gastroesophageal reflux disease) 05/09/2014  . Vitamin D deficiency 08/01/2013  . Prediabetes 08/01/2013  . 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 Diverticula of colon 05/07/2011  . Hypertension 01/31/2011  . Hyperlipidemia, mixed 01/31/2011  . BPH (benign prostatic hyperplasia) 01/31/2011  . Testosterone Deficiency 01/31/2011  . IBS (irritable bowel syndrome) 01/31/2011  . Partial complex seizure disorder with intractable epilepsy ([place]) 01/31/2011  . Depression, major, recurrent, in partial remission ([place]) 01/31/2011  . Asthma-COPD overlap syndrome ([place]) 01/31/2011    Beaulah Dinning, PT, DPT 01/27/19 2:17 PM  [facility] [address] [place], [place], [address] Phone: [phone]   Fax:  [phone]  Name: Mark Harrell MRN: [id number] Date of Birth: 15-Aug-1960

## 2019-01-29 ENCOUNTER — Other Ambulatory Visit: Payer: BC Managed Care – PPO

## 2019-02-01 ENCOUNTER — Ambulatory Visit: Payer: BC Managed Care – PPO | Admitting: Physical Therapy

## 2019-02-01 ENCOUNTER — Other Ambulatory Visit: Payer: Self-pay

## 2019-02-01 ENCOUNTER — Encounter: Payer: Self-pay | Admitting: Physical Therapy

## 2019-02-01 DIAGNOSIS — R29898 Other symptoms and signs involving the musculoskeletal system: Secondary | ICD-10-CM

## 2019-02-01 DIAGNOSIS — M6281 Muscle weakness (generalized): Secondary | ICD-10-CM | POA: Diagnosis not present

## 2019-02-01 DIAGNOSIS — M79609 Pain in unspecified limb: Secondary | ICD-10-CM

## 2019-02-01 NOTE — Therapy (Signed)
[place], [place], [address] Phone: [phone]   Fax:  [phone]  Physical Therapy Treatment  Patient Details  Name: Mark Harrell MRN: [id number] Date of Birth: 04-Apr-1961 Referring Provider (PT): Mack Guise PA-C [name]   Encounter Date: 02/01/2019  PT End of Session - 02/01/19 1052    Visit Number  4    Number of Visits  8    Date for PT Re-Evaluation  02/15/19    Authorization Type  BCBS    PT Start Time  1052    PT Stop Time  1136    PT Time Calculation (min)  44 min    Activity Tolerance  Patient limited by pain    Behavior During Therapy  [facility] for tasks assessed/performed       Past Medical History:  Diagnosis Date  . Anesthesia complication requiring reversal agent administration    ? from central apnea, very difficult to get off vent  . Anxiety   . Arthritis    osteo  . Asthma   . BPH (benign prostatic hyperplasia)   . Complication of anesthesia    difficulty waking , they twlight me because of my respiratory problems "  . Dyspnea   . Enlarged heart   . Family history of adverse reaction to anesthesia    mother trouble waking up, and heart stopped  . GERD (gastroesophageal reflux disease)   . Headache    botox injections for headaches  . Hyperlipidemia   . Hypertension   . Hypogonadism male   . IBS (irritable bowel syndrome)   . Obesity   . OSA (obstructive sleep apnea)    cpap  . Pneumonia   . Pre-diabetes   . Prostatitis     Past Surgical History:  Procedure Laterality Date  . ABDOMINAL SURGERY    . ANKLE FRACTURE SURGERY Right   . CYSTOSCOPY     [place]  . KNEE ARTHROSCOPY WITH MEDIAL MENISECTOMY Left 01/02/2017   Procedure: LEFT KNEE ARTHROSCOPY WITH PARTIAL MEDIAL MENISCECTOMY;  Surgeon: Mcarthur Rossetti, MD;  Location: [facility];  Service: Orthopedics;  Laterality: Left;  . TONSILLECTOMY    . [place] RESECTION  2007  . UVULOPALATOPHARYNGOPLASTY       There were no vitals filed for this visit.  Subjective Assessment - 02/01/19 1053    Subjective  Pt reports he thinks he may feel a little better at times. varies on the day.  Still having a lot of pain. Chondro pain acting up a gain.    Currently in Pain?  Yes    Pain Score  6     Pain Location  --   neck and low back   Pain Descriptors / Indicators  Radiating;Sharp;Sore    Pain Type  Chronic pain    Pain Onset  More than a month ago    Pain Frequency  Constant    Aggravating Factors   everything    Pain Relieving Factors  nothing         [facility] PT Assessment - 02/01/19 0001      Assessment   Medical Diagnosis  cervical stenosis and lumbar DDD    Referring Provider (PT)  Mack Guise PA-C [name]    Next MD Visit  02/17/2019      AROM   Right/Left Shoulder  --   moving into full ROM in supine position - still has pain    Cervical Extension  15    Cervical -  Right Rotation  50    Cervical - Left Rotation  37                   [facility] Adult PT Treatment/Exercise - 02/01/19 0001      Neck Exercises: Supine   Capital Flexion  10 reps   with slight manual assist initially   Other Supine Exercise  active assisted deep cervical flexion work     Other Supine Exercise  2x10 shoulder ER with red band      Lumbar Exercises: Aerobic   Nustep  held as pt feels this hurt his hip       Lumbar Exercises: Supine   Clam  10 reps   with PPT, 2 sets   Heel Slides  5 reps   2 sets, each side, VC for PPT with TA bracing   Bridge  5 reps;Compliant    Isometric Hip Flexion  5 reps;3 seconds   alternating sides   Isometric Hip Flexion Limitations  Lt side more difficult than Rt       Shoulder Exercises: Supine   Flexion  Strengthening;Both;10 reps;Theraband   2 sets overhead pull   Theraband Level (Shoulder Flexion)  Level 2 (Red)                  PT Long Term Goals - 02/01/19 1108      PT LONG TERM GOAL #1   Title  Patient demonstrates & verbalizes  understanding of on-going fitness plan / HEP.  (Target Date: 02/15/2019)    Status  On-going      PT LONG TERM GOAL #2   Title  improve functional ROM of bilat shoulders to allow ease in performing IADLs ( 02/15/2019)    Status  On-going      PT LONG TERM GOAL #3   Title  report =/> 50% reduction of pain with daily activities. ( 02/15/2019)    Baseline  no change      PT LONG TERM GOAL #4   Title  ambulate in the community with no more than 4/10 pain ( 02/15/2019)    Baseline  no change, pain stays atleast 6/10    Status  On-going      PT LONG TERM GOAL #5   Title  ---            Plan - 02/01/19 1121    Clinical Impression Statement  Performed some cervical flexion exercise active assisted and cervical/scapular stabilization work along with lumbar stabilization.  Vickie had pain with all motions and work.  Nustep was held as he reported increased hip pain with this.No goals met at this time.    Rehab Potential  Fair    PT Frequency  2x / week    PT Duration  4 weeks    PT Treatment/Interventions  Patient/family education;Moist Heat;Passive range of motion;Therapeutic exercise;Ultrasound;Traction;Cryotherapy;Electrical Stimulation;Manual techniques;Dry needling;Spinal Manipulations    PT Next Visit Plan  progress to functional exercise along with stabilization of the cervical and lumbar region, assess continuation of cervical flexion based program    Consulted and Agree with Plan of Care  Patient       Patient will benefit from skilled therapeutic intervention in order to improve the following deficits and impairments:  Decreased range of motion, Obesity, Impaired UE functional use, Pain, Decreased strength, Postural dysfunction  Visit Diagnosis: Muscle weakness (generalized)  Pain in extremity, unspecified extremity  Other symptoms and signs involving the musculoskeletal system  Problem List Patient Active Problem List   Diagnosis Date Noted  . Chronic respiratory  failure with hypoxia ([place]) 04/12/2018  . Bilateral lower extremity edema 12/29/2017  . Atypical chest pain 12/29/2017  . Hymenoptera allergy 11/10/2017  . Diverticulosis 08/07/2017  . Family history of colonic polyps 08/07/2017  . Flatus 08/07/2017  . Myofascial pain 08/06/2017  . Seasonal and perennial allergic rhinitis 04/23/2017  . 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 Cervicalgia 04/01/2017  . Cervical spondylosis with radiculopathy 02/09/2017  . Memory difficulty 12/05/2016  . Morbid obesity ([place]) 12/05/2016  . Diarrhea in adult patient 12/03/2016  . Internal hemorrhoids 12/03/2016  . Headache 11/13/2016  . Migraine without aura and without status migrainosus, not intractable 10/30/2016  . Irritant contact dermatitis due to frequent handwashing 06/08/2016  . Lumbar radiculopathy 05/15/2016  . Osteoarthritis of spine with radiculopathy, lumbar region 05/15/2016  . Risk for falls 05/15/2016  . Acute medial meniscal tear, left, subsequent encounter 04/23/2016  . Recurrent infections 03/29/2016  . Chronic nonseasonal allergic rhinitis due to pollen 03/29/2016  . Polypharmacy 01/16/2016  . Morbid obesity with BMI of 50.0-59.9, adult ([place]) 03/06/2015  . SDAT 02/05/2015  . Asthma 02/05/2015  . OSA and COPD overlap syndrome ([place]) 02/05/2015  . Medication management 08/02/2014  . GERD (gastroesophageal reflux disease) 05/09/2014  . Vitamin D deficiency 08/01/2013  . Prediabetes 08/01/2013  . 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 Diverticula of colon 05/07/2011  . Hypertension 01/31/2011  . Hyperlipidemia, mixed 01/31/2011  . BPH (benign prostatic hyperplasia) 01/31/2011  . Testosterone Deficiency 01/31/2011  . IBS (irritable bowel syndrome) 01/31/2011  . Partial complex seizure disorder with intractable epilepsy ([place]) 01/31/2011  . Depression, major, recurrent, in partial remission ([place]) 01/31/2011  . Asthma-COPD overlap syndrome ([place]) 01/31/2011    Jeral Pinch PT  02/01/2019, 11:37  AM  [facility] [address] [place], [place], [address] Phone: [phone]   Fax:  [phone]  Name: Mark Harrell MRN: [id number] Date of Birth: Sep 04, 1960

## 2019-02-02 ENCOUNTER — Encounter: Payer: Self-pay | Admitting: Cardiovascular Disease

## 2019-02-02 ENCOUNTER — Ambulatory Visit: Payer: BC Managed Care – PPO | Admitting: Cardiovascular Disease

## 2019-02-02 DIAGNOSIS — E782 Mixed hyperlipidemia: Secondary | ICD-10-CM | POA: Diagnosis not present

## 2019-02-02 DIAGNOSIS — I10 Essential (primary) hypertension: Secondary | ICD-10-CM | POA: Diagnosis not present

## 2019-02-02 DIAGNOSIS — M47816 Spondylosis without myelopathy or radiculopathy, lumbar region: Secondary | ICD-10-CM | POA: Diagnosis not present

## 2019-02-02 DIAGNOSIS — G894 Chronic pain syndrome: Secondary | ICD-10-CM | POA: Diagnosis not present

## 2019-02-02 DIAGNOSIS — M4722 Other spondylosis with radiculopathy, cervical region: Secondary | ICD-10-CM | POA: Diagnosis not present

## 2019-02-02 NOTE — Assessment & Plan Note (Signed)
History of hyperlipidemia on Pravachol with lipid profile performed 12/29/2018 revealing total cholesterol of 134, LDL 69 HDL 32.

## 2019-02-02 NOTE — Assessment & Plan Note (Signed)
History of morbid obesity with a BMI of 47.  He has lost 65 pounds since I saw him last.

## 2019-02-02 NOTE — Progress Notes (Signed)
02/02/2019 Mark Harrell   10-Sep-1960  [id number]  Primary Physician [name], MD Primary Cardiologist: Lorretta Harp MD Lupe Carney, [place]  HPI:  TIERRE HELLYER is a 58 y.o.  morbidly overweight single Caucasian male who children referred by Dr.McKeownfor cardiovascular evaluation because of hypertension, cardiac enlargement and lower extremity edema.  I last saw him in the office 12/29/2017.  He has no prior cardiac history. His risk factors include treated hypertension and hyperlipidemia. There is no family history. He's never had a heart attack or stroke. He denies chest pain but does get dyspneic on exertion probably related to his body habitus and deconditioning. He also has chronic mild lower extremity edema probably related to his obesity as well. He's been told that he has cardiac enlargement in the past. A 2D echocardiogram performed 02/23/2017 revealed normal LV size and function.  He does have obstructive sleep apnea currently not on CPAP.  He is also had an episode of chest pain which resulted in ER eval with negative troponins recently and has had several less intense episodes since that time which has been attributed to esophageal spasm or GERD.  Unfortunately, his body size and habitus preclude noninvasive evaluation.  Since I saw him a year ago he is remained stable.  He did injure his neck and apparently needs laminectomy by Dr. [name] which we will clear him for a mildly elevated risk because of obesity.  LV function is normal by 2D echo performed 02/23/2017.   Current Meds  Medication Sig  . albuterol (PROVENTIL HFA;VENTOLIN HFA) 108 (90 Base) MCG/ACT inhaler 1 to 2 inhalations 10 to 15 minutes apart every 4 hrs to rescue Asthma  . alfuzosin (UROXATRAL) 10 MG 24 hr tablet Take 10 mg by mouth daily.    [name] anastrozole (ARIMIDEX) 1 MG tablet daily.  [name] azelastine (ASTELIN) 0.1 % nasal spray Use 1 to 2 sprays  Each Nostril 2 x /day  . baclofen (LIORESAL) 10 MG tablet  Take 10 mg by mouth 3 (three) times daily as needed for muscle spasms.   . bisoprolol-hydrochlorothiazide (ZIAC) 5-6.25 MG tablet Take 1 tablet Daily for BP  . Botulinum Toxin Type A 200 units SOLR Inject into the skin.  . Calcium Carbonate-Vitamin D (CALCIUM + D PO) Take 500 mg by mouth 3 (three) times daily.   . cetaphil (CETAPHIL) lotion Apply topically 2 (two) times daily.  . cetirizine (ZYRTEC) 10 MG tablet Take 10 mg by mouth daily.  . Cholecalciferol (VITAMIN D PO) Take 10,000-20,000 Units by mouth 2 (two) times daily. 20000 in the morning and 10000 in the evening  . Cinnamon 500 MG capsule Take by mouth.  . clotrimazole-betamethasone (LOTRISONE) cream APPLY TO AFFECTED AREA TWICE A DAY  . co-enzyme Q-10 30 MG capsule Take 30 mg by mouth daily.  . cyclobenzaprine (FLEXERIL) 10 MG tablet Take 10 mg by mouth 3 (three) times daily.   . diclofenac sodium (VOLTAREN) 1 % GEL Apply 2 to 4 grams Topically 2 to 4 x /day for Pain & Inflammation  . donepezil (ARICEPT) 23 MG TABS tablet Take 23 mg by mouth at bedtime.  [name] doxycycline (VIBRAMYCIN) 100 MG capsule TAKE 1 CAPSULE BY MOUTH TWICE A DAY  . DUPIXENT 300 MG/2ML SOSY RX1: INJECT 2 SYRINGES UNDER THE SKIN (SUBCUTANEOUS INJECTION) ON DAY 1 , THEN 1 SYRINGE ON DAY 15 AND EVERY OTHER WEEK THEREAFTER  . Eluxadoline (VIBERZI) 100 MG TABS Take 1 tablet by mouth 2 (two)  times daily.   [name] EPINEPHrine 0.3 mg/0.3 mL IJ SOAJ injection Inject as directed.  . fluticasone (FLONASE) 50 MCG/ACT nasal spray Use 1 to 2 sprays each Nostril 2 x /day  . Fluticasone-Umeclidin-Vilant (TRELEGY ELLIPTA) 100-62.5-25 MCG/INH AEPB Inhale 1 puff into the lungs daily.  . furosemide (LASIX) 80 MG tablet TAKE 1 TO 1 & 1/2 TABLETS 2 X /DAY FOR FLUID RETENTION & SWELLING OF LEGS  . GLUCOSAMINE-CHONDROITIN PO Take 3 tablets by mouth daily.  [name] guaiFENesin (MUCINEX) 600 MG 12 hr tablet Take 600 mg by mouth 2 (two) times daily as needed for cough or to loosen phlegm. Instructed to  take 3 tablets in the morning  . ipratropium-albuterol (DUONEB) 0.5-2.5 (3) MG/3ML SOLN Take 3 mLs by nebulization every 4 (four) hours as needed.  [name] KRILL OIL PO Take by mouth.  [name] l-methylfolate-B6-B12 (METANX) 3-35-2 MG TABS tablet Take 1 tablet by mouth daily.  . LUTEIN PO Take 1 tablet by mouth 2 (two) times daily.  [name] LYRICA 150 MG capsule Take 2 capsules (300 mg total) by mouth 2 (two) times daily.  . memantine (NAMENDA) 10 MG tablet Take 10 mg by mouth daily.   . metFORMIN (GLUCOPHAGE-XR) 500 MG 24 hr tablet Take 2 tablets 2 x /day with Breakfast & Supper  for Diabetes  . metolazone (ZAROXOLYN) 5 MG tablet Take 1 tablet daily for Fluid Retention  . montelukast (SINGULAIR) 10 MG tablet TAKE 1 TABLET BY MOUTH EVERY DAY  . Multiple Vitamins-Minerals (MULTIVITAMIN WITH MINERALS) tablet Take 1 tablet by mouth daily.  . naloxone (NARCAN) nasal spray 4 mg/0.1 mL USE AS DIRECTED  . ondansetron (ZOFRAN-ODT) 4 MG disintegrating tablet TAKE 1 TABLET (4 MG TOTAL) BY MOUTH EVERY 6 (SIX) HOURS AS NEEDED FOR NAUSEA OR VOMITING.  [name] oxyCODONE-acetaminophen (PERCOCET) 10-325 MG tablet Take 1 tablet by mouth daily as needed.  [name] oxymetazoline (AFRIN) 0.05 % nasal spray Place 1 spray into both nostrils See admin instructions. 1 SPRAY PER SIDE EACH NIGHT BEFORE DYMISTA  . pantoprazole (PROTONIX) 40 MG tablet Take 1 tablet Daily for Acid Indigestion & Reflux  . pentosan polysulfate (ELMIRON) 100 MG capsule Take 100 mg by mouth 3 (three) times daily before meals.   . potassium chloride SA (KLOR-CON M20) 20 MEQ tablet TAKE 1 TABLET BY MOUTH TWICE A DAY  . pravastatin (PRAVACHOL) 40 MG tablet TAKE 1 TABLET BY MOUTH EVERYDAY AT BEDTIME  . PRESCRIPTION MEDICATION Pt receives weekly allergy shots  . PROBIOTIC PRODUCT PO Take by mouth.  . promethazine-dextromethorphan (PROMETHAZINE-DM) 6.25-15 MG/5ML syrup Take 5 mLs by mouth 4 (four) times daily as needed for cough.  . sertraline (ZOLOFT) 50 MG tablet Take 50 mg by  mouth 2 (two) times daily.   . sodium chloride (OCEAN) 0.65 % SOLN nasal spray Place 1 spray into both nostrils 4 (four) times daily as needed for congestion. Uses each time before other nasal sprays  . tadalafil (CIALIS) 5 MG tablet Take 5 mg by mouth every evening.   . triamcinolone cream (KENALOG) 0.1 % APPLY TOPICALLY 4 TIMES A DAY (Patient taking differently: APPLY TOPICALLY 2 TIMES A DAY AS NEEDED for itching)  . trospium (SANCTURA) 20 MG tablet Take 20 mg by mouth 2 (two) times daily.   . TURMERIC PO Take 3 capsules by mouth daily.   Current Facility-Administered Medications for the 02/02/19 encounter (Office Visit) with Lorretta Harp, MD  Medication  . Mepolizumab SOLR 100 mg     Allergies  Allergen Reactions  .  Bee Venom Swelling  . Ppd [Tuberculin Purified Protein Derivative] Other (See Comments)    +ppd NEG Quantferron Gold 3/13  . Fenofibrate Other (See Comments)    Back pain  . Levofloxacin Diarrhea  . Other Other (See Comments)    Some antibiotics cause diarrhea  . Verapamil Other (See Comments)    Back pain  . Claritin [Loratadine] Other (See Comments)    unknown  . Duloxetine     Brought on asthma    Social History   Socioeconomic History  . Marital status: Single    Spouse name: Not on file  . Number of children: Not on file  . Years of education: Not on file  . Highest education level: Not on file  Occupational History  . Occupation: Dance movement psychotherapist  Social Needs  . Financial resource strain: Not on file  . Food insecurity    Worry: Not on file    Inability: Not on file  . Transportation needs    Medical: Not on file    Non-medical: Not on file  Tobacco Use  . Smoking status: Former Smoker    Packs/day: 0.10    Years: 15.00    Pack years: 1.50  . Smokeless tobacco: Never Used  . Tobacco comment: significant second-hand exposure through mother  Substance and Sexual Activity  . Alcohol use: Yes    Alcohol/week: 1.0 standard drinks     Types: 1 Glasses of wine per week    Comment: 2 x a year  . Drug use: No  . Sexual activity: Never  Lifestyle  . Physical activity    Days per week: Not on file    Minutes per session: Not on file  . Stress: Not on file  Relationships  . Social Herbalist on phone: Not on file    Gets together: Not on file    Attends religious service: Not on file    Active member of club or organization: Not on file    Attends meetings of clubs or organizations: Not on file    Relationship status: Not on file  . Intimate partner violence    Fear of current or ex partner: Not on file    Emotionally abused: Not on file    Physically abused: Not on file    Forced sexual activity: Not on file  Other Topics Concern  . Not on file  Social History Narrative   [facility]:   Originally from [place]. Previously has lived in [place]. He has lived in [place], [place], & [place]. He has worked in Engineer, production. No pets currently. Brief exposure to a roommates bird Secretary/administrator) in college. No mold, asbestos, or hot tub exposure.      Review of Systems: General: negative for chills, fever, night sweats or weight changes.  Cardiovascular: negative for chest pain, dyspnea on exertion, edema, orthopnea, palpitations, paroxysmal nocturnal dyspnea or shortness of breath Dermatological: negative for rash Respiratory: negative for cough or wheezing Urologic: negative for hematuria Abdominal: negative for nausea, vomiting, diarrhea, bright red blood per rectum, melena, or hematemesis Neurologic: negative for visual changes, syncope, or dizziness All other systems reviewed and are otherwise negative except as noted above.    Blood pressure 135/87, pulse 84, height 6' (1.829 m), weight (!) 345 lb 3.2 oz (156.6 kg).  General appearance: alert and no distress Neck: no adenopathy, no carotid bruit, no JVD, supple, symmetrical, trachea midline and thyroid not enlarged, symmetric, no  tenderness/mass/nodules  Lungs: clear to auscultation bilaterally Heart: regular rate and rhythm, S1, S2 normal, no murmur, click, rub or gallop Extremities: extremities normal, atraumatic, no cyanosis or edema Pulses: 2+ and symmetric Skin: Skin color, texture, turgor normal. No rashes or lesions Neurologic: Alert and oriented X 3, normal strength and tone. Normal symmetric reflexes. Normal coordination and gait  EKG not performed today  ASSESSMENT AND PLAN:   Hypertension History of essential hypertension with blood pressure measured today 135/87.  He is on Ziac  Hyperlipidemia, mixed History of hyperlipidemia on Pravachol with lipid profile performed 12/29/2018 revealing total cholesterol of 134, LDL 69 HDL 32.  Morbid obesity ([place]) History of morbid obesity with a BMI of 47.  He has lost 65 pounds since I saw him last.      Lorretta Harp MD [facility], [facility] 02/02/2019 11:36 AM

## 2019-02-02 NOTE — Assessment & Plan Note (Signed)
History of essential hypertension with blood pressure measured today 135/87.  He is on Ziac

## 2019-02-02 NOTE — Patient Instructions (Addendum)
Medication Instructions:  Your physician recommends that you continue on your current medications as directed. Please refer to the Current Medication list given to you today.  If you need a refill on your cardiac medications before your next appointment, please call your pharmacy.   Lab work: NONE If you have labs (blood work) drawn today and your tests are completely normal, you will receive your results only by: [name] MyChart Message (if you have MyChart) OR . A paper copy in the mail If you have any lab test that is abnormal or we need to change your treatment, we will call you to review the results.  Testing/Procedures: NONE  Follow-Up: At [facility], you and your health needs are our priority.  As part of our continuing mission to provide you with exceptional heart care, we have created designated Provider Care Teams.  These Care Teams include your primary Cardiologist (physician) and Advanced Practice Providers (APPs -  Physician Assistants and Nurse Practitioners) who all work together to provide you with the care you need, when you need it. . You will need a follow up appointment in 12 months with Dr. Quay Burow.  Please call our office 2 months in advance to schedule this appointment.     Any Other Special Instructions Will Be Listed Below (If Applicable). You have been cleared at mildly elevated risk, from a cardiac standpoint, for your upcoming procedure.

## 2019-02-03 ENCOUNTER — Other Ambulatory Visit: Payer: Self-pay

## 2019-02-03 ENCOUNTER — Ambulatory Visit: Payer: BC Managed Care – PPO | Admitting: Specialist

## 2019-02-03 ENCOUNTER — Ambulatory Visit: Payer: BC Managed Care – PPO | Admitting: Physical Therapy

## 2019-02-03 ENCOUNTER — Encounter: Payer: Self-pay | Admitting: Physical Therapy

## 2019-02-03 DIAGNOSIS — R29898 Other symptoms and signs involving the musculoskeletal system: Secondary | ICD-10-CM

## 2019-02-03 DIAGNOSIS — M79609 Pain in unspecified limb: Secondary | ICD-10-CM

## 2019-02-03 DIAGNOSIS — M6281 Muscle weakness (generalized): Secondary | ICD-10-CM | POA: Diagnosis not present

## 2019-02-03 NOTE — Therapy (Signed)
[place], [place], [address] Phone: [phone]   Fax:  [phone]  Physical Therapy Treatment  Patient Details  Name: Mark Harrell MRN: [id number] Date of Birth: 09-15-1960 Referring Provider (PT): Mack Guise PA-C [name]   Encounter Date: 02/03/2019  PT End of Session - 02/03/19 1150    Visit Number  5    Number of Visits  8    Date for PT Re-Evaluation  02/15/19    Authorization Type  BCBS    PT Start Time  1101    PT Stop Time  1153    PT Time Calculation (min)  52 min    Activity Tolerance  Patient limited by pain    Behavior During Therapy  [facility] for tasks assessed/performed       Past Medical History:  Diagnosis Date  . Anesthesia complication requiring reversal agent administration    ? from central apnea, very difficult to get off vent  . Anxiety   . Arthritis    osteo  . Asthma   . BPH (benign prostatic hyperplasia)   . Complication of anesthesia    difficulty waking , they twlight me because of my respiratory problems "  . Dyspnea   . Enlarged heart   . Family history of adverse reaction to anesthesia    mother trouble waking up, and heart stopped  . GERD (gastroesophageal reflux disease)   . Headache    botox injections for headaches  . Hyperlipidemia   . Hypertension   . Hypogonadism male   . IBS (irritable bowel syndrome)   . Obesity   . OSA (obstructive sleep apnea)    cpap  . Pneumonia   . Pre-diabetes   . Prostatitis     Past Surgical History:  Procedure Laterality Date  . ABDOMINAL SURGERY    . ANKLE FRACTURE SURGERY Right   . CYSTOSCOPY     [place]  . KNEE ARTHROSCOPY WITH MEDIAL MENISECTOMY Left 01/02/2017   Procedure: LEFT KNEE ARTHROSCOPY WITH PARTIAL MEDIAL MENISCECTOMY;  Surgeon: Mcarthur Rossetti, MD;  Location: [facility];  Service: Orthopedics;  Laterality: Left;  . TONSILLECTOMY    . [place] RESECTION  2007  . UVULOPALATOPHARYNGOPLASTY       There were no vitals filed for this visit.  Subjective Assessment - 02/03/19 1148    Subjective  Neck and back about the same. Continues with LE radicular pain as well as left>right UE pain. Pt. saw pain management MD and pending status for low back reports ablation may be option if failing to improve with therapy.                       [place] Adult PT Treatment/Exercise - 02/03/19 0001      Neck Exercises: Supine   Neck Retraction  15 reps    Neck Retraction Limitations  supine with manual assistance    Capital Flexion Limitations  15 reps assisted retraction to flecion      Lumbar Exercises: Supine   Pelvic Tilt  10 reps    Clam  10 reps    Clam Limitations  red Theraband    Heel Slides  5 reps   2 sets, each side, VC for PPT with TA bracing   Bridge  5 reps;Compliant      Modalities   Modalities  Moist Heat      Moist Heat Therapy   Number Minutes Moist Heat  10 Minutes  Moist Heat Location  --   cervical and lumbar in sitting     Manual Therapy   Joint Mobilization  LAD bilat. hips grade I-IV    Manual Traction  Cervical manual traction, suboccipital release      Neck Exercises: Stretches   Upper Trapezius Stretch  Right;Left;3 reps;20 seconds    Upper Trapezius Stretch Limitations  supine manual stretches    Levator Stretch  Right;Left;3 reps;20 seconds    Levator Stretch Limitations  supine manual stretches             PT Education - 02/03/19 1150    Education Details  POC    Person(s) Educated  Patient    Methods  Explanation    Comprehension  Verbalized understanding          PT Long Term Goals - 02/01/19 1108      PT LONG TERM GOAL #1   Title  Patient demonstrates & verbalizes understanding of on-going fitness plan / HEP.  (Target Date: 02/15/2019)    Status  On-going      PT LONG TERM GOAL #2   Title  improve functional ROM of bilat shoulders to allow ease in performing IADLs ( 02/15/2019)    Status  On-going      PT  LONG TERM GOAL #3   Title  report =/> 50% reduction of pain with daily activities. ( 02/15/2019)    Baseline  no change      PT LONG TERM GOAL #4   Title  ambulate in the community with no more than 4/10 pain ( 02/15/2019)    Baseline  no change, pain stays atleast 6/10    Status  On-going      PT LONG TERM GOAL #5   Title  ---            Plan - 02/03/19 1151    Clinical Impression Statement  Limited progress with therapy to date for both neck and back with continued radicular symptoms. Plan continue therapy per POC from eval but if failing to note improvement in the next few visits plan have pt. follow up with MD for further tx. options.    Personal Factors and Comorbidities  Behavior Pattern;Past/Current Experience;Comorbidity 3+    Examination-Activity Limitations  Sleep    Examination-Participation Restrictions  Other    Stability/Clinical Decision Making  Evolving/Moderate complexity    Clinical Decision Making  Moderate    Rehab Potential  Fair    PT Frequency  2x / week    PT Duration  4 weeks    PT Treatment/Interventions  Patient/family education;Moist Heat;Passive range of motion;Therapeutic exercise;Ultrasound;Traction;Cryotherapy;Electrical Stimulation;Manual techniques;Dry needling;Spinal Manipulations    PT Next Visit Plan  progress to functional exercise along with stabilization of the cervical and lumbar region, assess continuation of cervical flexion based program    PT Home Exercise Plan  decompression series    Consulted and Agree with Plan of Care  Patient       Patient will benefit from skilled therapeutic intervention in order to improve the following deficits and impairments:  Decreased range of motion, Obesity, Impaired UE functional use, Pain, Decreased strength, Postural dysfunction  Visit Diagnosis: Muscle weakness (generalized)  Pain in extremity, unspecified extremity  Other symptoms and signs involving the musculoskeletal system     Problem  List Patient Active Problem List   Diagnosis Date Noted  . Chronic respiratory failure with hypoxia ([place]) 04/12/2018  . Bilateral lower extremity edema 12/29/2017  . Atypical chest  pain 12/29/2017  . Hymenoptera allergy 11/10/2017  . Diverticulosis 08/07/2017  . Family history of colonic polyps 08/07/2017  . Flatus 08/07/2017  . Myofascial pain 08/06/2017  . Seasonal and perennial allergic rhinitis 04/23/2017  . 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 Cervicalgia 04/01/2017  . Cervical spondylosis with radiculopathy 02/09/2017  . Memory difficulty 12/05/2016  . Morbid obesity ([place]) 12/05/2016  . Diarrhea in adult patient 12/03/2016  . Internal hemorrhoids 12/03/2016  . Headache 11/13/2016  . Migraine without aura and without status migrainosus, not intractable 10/30/2016  . Irritant contact dermatitis due to frequent handwashing 06/08/2016  . Lumbar radiculopathy 05/15/2016  . Osteoarthritis of spine with radiculopathy, lumbar region 05/15/2016  . Risk for falls 05/15/2016  . Acute medial meniscal tear, left, subsequent encounter 04/23/2016  . Recurrent infections 03/29/2016  . Chronic nonseasonal allergic rhinitis due to pollen 03/29/2016  . Polypharmacy 01/16/2016  . Morbid obesity with BMI of 50.0-59.9, adult ([place]) 03/06/2015  . SDAT 02/05/2015  . Asthma 02/05/2015  . OSA and COPD overlap syndrome ([place]) 02/05/2015  . Medication management 08/02/2014  . GERD (gastroesophageal reflux disease) 05/09/2014  . Vitamin D deficiency 08/01/2013  . Prediabetes 08/01/2013  . 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 Diverticula of colon 05/07/2011  . Hypertension 01/31/2011  . Hyperlipidemia, mixed 01/31/2011  . BPH (benign prostatic hyperplasia) 01/31/2011  . Testosterone Deficiency 01/31/2011  . IBS (irritable bowel syndrome) 01/31/2011  . Partial complex seizure disorder with intractable epilepsy ([place]) 01/31/2011  . Depression, major, recurrent, in partial remission ([place]) 01/31/2011  .  Asthma-COPD overlap syndrome ([place]) 01/31/2011    Beaulah Dinning, PT, DPT 02/03/19 11:53 AM  [facility] [address] [place], [place], [address] Phone: [phone]   Fax:  [phone]  Name: NICHLOAS RAMETTA MRN: [id number] Date of Birth: 04/06/1961

## 2019-02-07 ENCOUNTER — Telehealth: Payer: Self-pay | Admitting: Physical Therapy

## 2019-02-07 ENCOUNTER — Ambulatory Visit: Payer: BC Managed Care – PPO | Admitting: Physical Therapy

## 2019-02-07 NOTE — Telephone Encounter (Signed)
Called to check on status regarding no show for 3 PM appointment-left voicemail.

## 2019-02-08 ENCOUNTER — Ambulatory Visit: Payer: BC Managed Care – PPO

## 2019-02-08 ENCOUNTER — Other Ambulatory Visit: Payer: Self-pay

## 2019-02-08 DIAGNOSIS — M79609 Pain in unspecified limb: Secondary | ICD-10-CM | POA: Diagnosis not present

## 2019-02-08 DIAGNOSIS — R29898 Other symptoms and signs involving the musculoskeletal system: Secondary | ICD-10-CM | POA: Diagnosis not present

## 2019-02-08 DIAGNOSIS — M6281 Muscle weakness (generalized): Secondary | ICD-10-CM | POA: Diagnosis not present

## 2019-02-08 NOTE — Therapy (Signed)
[facility] [place], [place], [address] Phone: [phone]   Fax:  [phone]  Physical Therapy Treatment  Patient Details  Name: Mark Harrell MRN: [id number] Date of Birth: 03-09-1961 Referring Provider (PT): Mark Guise PA-C [place]   Encounter Date: 02/08/2019  PT End of Session - 02/08/19 0908    Visit Number  6    Number of Visits  8    Date for PT Re-Evaluation  02/15/19    Authorization Type  BCBS    PT Start Time  0915    PT Stop Time  1000    PT Time Calculation (min)  45 min       Past Medical History:  Diagnosis Date  . Anesthesia complication requiring reversal agent administration    ? from central apnea, very difficult to get off vent  . Anxiety   . Arthritis    osteo  . Asthma   . BPH (benign prostatic hyperplasia)   . Complication of anesthesia    difficulty waking , they twlight me because of my respiratory problems "  . Dyspnea   . Enlarged heart   . Family history of adverse reaction to anesthesia    mother trouble waking up, and heart stopped  . GERD (gastroesophageal reflux disease)   . Headache    botox injections for headaches  . Hyperlipidemia   . Hypertension   . Hypogonadism male   . IBS (irritable bowel syndrome)   . Obesity   . OSA (obstructive sleep apnea)    cpap  . Pneumonia   . Pre-diabetes   . Prostatitis     Past Surgical History:  Procedure Laterality Date  . ABDOMINAL SURGERY    . ANKLE FRACTURE SURGERY Right   . CYSTOSCOPY     [facility]  . KNEE ARTHROSCOPY WITH MEDIAL MENISECTOMY Left 01/02/2017   Procedure: LEFT KNEE ARTHROSCOPY WITH PARTIAL MEDIAL MENISCECTOMY;  Surgeon: Mark Rossetti, MD;  Location: [facility];  Service: Orthopedics;  Laterality: Left;  . TONSILLECTOMY    . [place] RESECTION  2007  . UVULOPALATOPHARYNGOPLASTY      There were no vitals filed for this visit.  Subjective Assessment - 02/08/19 0921    Subjective  No better    Contniue with back and nec pain.    PT not  helping so far.                       [name] Adult PT Treatment/Exercise - 02/08/19 0001      Neck Exercises: Seated   Neck Retraction  10 reps    W Back  10 reps    Shoulder Rolls  10 reps      Lumbar Exercises: Supine   Pelvic Tilt  10 reps    Clam  10 reps    Clam Limitations  green Theraband      Moist Heat Therapy   Number Minutes Moist Heat  15 Minutes    Moist Heat Location  --   neck and back     Manual Therapy   Manual therapy comments  ROM both hips , pain with adduction on lateral hips bilaterally.                   PT Long Term Goals - 02/01/19 1108      PT LONG TERM GOAL #1   Title  Patient demonstrates & verbalizes understanding of on-going fitness plan / HEP.  (Target  Date: 02/15/2019)    Status  On-going      PT LONG TERM GOAL #2   Title  improve functional ROM of bilat shoulders to allow ease in performing IADLs ( 02/15/2019)    Status  On-going      PT LONG TERM GOAL #3   Title  report =/> 50% reduction of pain with daily activities. ( 02/15/2019)    Baseline  no change      PT LONG TERM GOAL #4   Title  ambulate in the community with no more than 4/10 pain ( 02/15/2019)    Baseline  no change, pain stays atleast 6/10    Status  On-going      PT LONG TERM GOAL #5   Title  ---            Plan - 02/08/19 1044    Clinical Impression Statement  No improvement since start of cae . Does not appear PT will be of benefit for Mark [facility]rell.    PT Treatment/Interventions  Patient/family education;Moist Heat;Passive range of motion;Therapeutic exercise;Ultrasound;Traction;Cryotherapy;Electrical Stimulation;Manual techniques;Dry needling;Spinal Manipulations    PT Next Visit Plan  progress to functional exercise along with stabilization of the cervical and lumbar region, assess continuation of cervical flexion based program    PT Home Exercise Plan  decompression series, hip adduction active  stretch in sitting.   scapula retraction  bilaterlaly    Consulted and Agree with Plan of Care  Patient       Patient will benefit from skilled therapeutic intervention in order to improve the following deficits and impairments:  Decreased range of motion, Obesity, Impaired UE functional use, Pain, Decreased strength, Postural dysfunction  Visit Diagnosis: Muscle weakness (generalized)  Pain in extremity, unspecified extremity  Other symptoms and signs involving the musculoskeletal system     Problem List Patient Active Problem List   Diagnosis Date Noted  . Chronic respiratory failure with hypoxia ([place]) 04/12/2018  . Bilateral lower extremity edema 12/29/2017  . Atypical chest pain 12/29/2017  . Hymenoptera allergy 11/10/2017  . Diverticulosis 08/07/2017  . Family history of colonic polyps 08/07/2017  . Flatus 08/07/2017  . Myofascial pain 08/06/2017  . Seasonal and perennial allergic rhinitis 04/23/2017  . 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 Cervicalgia 04/01/2017  . Cervical spondylosis with radiculopathy 02/09/2017  . Memory difficulty 12/05/2016  . Morbid obesity ([place]) 12/05/2016  . Diarrhea in adult patient 12/03/2016  . Internal hemorrhoids 12/03/2016  . Headache 11/13/2016  . Migraine without aura and without status migrainosus, not intractable 10/30/2016  . Irritant contact dermatitis due to frequent handwashing 06/08/2016  . Lumbar radiculopathy 05/15/2016  . Osteoarthritis of spine with radiculopathy, lumbar region 05/15/2016  . Risk for falls 05/15/2016  . Acute medial meniscal tear, left, subsequent encounter 04/23/2016  . Recurrent infections 03/29/2016  . Chronic nonseasonal allergic rhinitis due to pollen 03/29/2016  . Polypharmacy 01/16/2016  . Morbid obesity with BMI of 50.0-59.9, adult ([place]) 03/06/2015  . SDAT 02/05/2015  . Asthma 02/05/2015  . OSA and COPD overlap syndrome ([place]) 02/05/2015  . Medication management 08/02/2014  . GERD  (gastroesophageal reflux disease) 05/09/2014  . Vitamin D deficiency 08/01/2013  . Prediabetes 08/01/2013  . 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 Diverticula of colon 05/07/2011  . Hypertension 01/31/2011  . Hyperlipidemia, mixed 01/31/2011  . BPH (benign prostatic hyperplasia) 01/31/2011  . Testosterone Deficiency 01/31/2011  . IBS (irritable bowel syndrome) 01/31/2011  . Partial complex seizure disorder with intractable epilepsy ([place]) 01/31/2011  .  Depression, major, recurrent, in partial remission ([place]) 01/31/2011  . Asthma-COPD overlap syndrome ([place]) 01/31/2011    Darrel Hoover  PT 02/08/2019, 10:46 AM  [facility] [address] [place], [place], [address] Phone: [phone]   Fax:  [phone]  Name: Mark Harrell MRN: [id number] Date of Birth: 01-20-1961

## 2019-02-09 ENCOUNTER — Ambulatory Visit: Payer: BC Managed Care – PPO | Admitting: Physical Therapy

## 2019-02-09 ENCOUNTER — Encounter: Payer: Self-pay | Admitting: Physical Therapy

## 2019-02-09 DIAGNOSIS — M6281 Muscle weakness (generalized): Secondary | ICD-10-CM

## 2019-02-09 DIAGNOSIS — G43709 Chronic migraine without aura, not intractable, without status migrainosus: Secondary | ICD-10-CM | POA: Diagnosis not present

## 2019-02-09 DIAGNOSIS — M79609 Pain in unspecified limb: Secondary | ICD-10-CM

## 2019-02-09 DIAGNOSIS — R29898 Other symptoms and signs involving the musculoskeletal system: Secondary | ICD-10-CM

## 2019-02-09 NOTE — Therapy (Signed)
[place], [place], [address] Phone: [phone]   Fax:  [phone]  Physical Therapy Treatment  Patient Details  Name: Mark Harrell MRN: [id number] Date of Birth: 17-Sep-1960 Referring Provider (PT): Mack Guise PA-C [name]   Encounter Date: 02/09/2019  PT End of Session - 02/09/19 1533    Visit Number  7    Number of Visits  8    Date for PT Re-Evaluation  02/15/19    Authorization Type  BCBS    PT Start Time  1458    PT Stop Time  1550    PT Time Calculation (min)  52 min    Activity Tolerance  Patient limited by pain    Behavior During Therapy  [facility] for tasks assessed/performed       Past Medical History:  Diagnosis Date  . Anesthesia complication requiring reversal agent administration    ? from central apnea, very difficult to get off vent  . Anxiety   . Arthritis    osteo  . Asthma   . BPH (benign prostatic hyperplasia)   . Complication of anesthesia    difficulty waking , they twlight me because of my respiratory problems "  . Dyspnea   . Enlarged heart   . Family history of adverse reaction to anesthesia    mother trouble waking up, and heart stopped  . GERD (gastroesophageal reflux disease)   . Headache    botox injections for headaches  . Hyperlipidemia   . Hypertension   . Hypogonadism male   . IBS (irritable bowel syndrome)   . Obesity   . OSA (obstructive sleep apnea)    cpap  . Pneumonia   . Pre-diabetes   . Prostatitis     Past Surgical History:  Procedure Laterality Date  . ABDOMINAL SURGERY    . ANKLE FRACTURE SURGERY Right   . CYSTOSCOPY     [place]  . KNEE ARTHROSCOPY WITH MEDIAL MENISECTOMY Left 01/02/2017   Procedure: LEFT KNEE ARTHROSCOPY WITH PARTIAL MEDIAL MENISCECTOMY;  Surgeon: Mcarthur Rossetti, MD;  Location: [facility];  Service: Orthopedics;  Laterality: Left;  . TONSILLECTOMY    . [place] RESECTION  2007  . UVULOPALATOPHARYNGOPLASTY       There were no vitals filed for this visit.  Subjective Assessment - 02/09/19 1458    Subjective  Still no significant improvement with therapy to date. Continues with previous neck and back pain symptoms.    Currently in Pain?  Yes    Pain Score  7     Pain Location  --   neck and low back   Pain Descriptors / Indicators  Radiating;Sharp;Sore    Pain Type  Chronic pain    Pain Onset  More than a month ago    Pain Frequency  Constant    Aggravating Factors   everything    Pain Relieving Factors  no specific eases noted         [facility] PT Assessment - 02/09/19 0001      AROM   Cervical Extension  17    Cervical - Right Rotation  41    Cervical - Left Rotation  40                   [facility] Adult PT Treatment/Exercise - 02/09/19 0001      Neck Exercises: Supine   Neck Retraction  15 reps    Neck Retraction Limitations  supine with manual assistance  Capital Flexion Limitations  15 reps assisted retraction to flecion      Lumbar Exercises: Stretches   Lower Trunk Rotation Limitations  manually assisted right LTR x 15 reps      Lumbar Exercises: Supine   Pelvic Tilt  15 reps    Clam  15 reps    Clam Limitations  Green Theraband      Moist Heat Therapy   Number Minutes Moist Heat  10 Minutes    Moist Heat Location  --   neck and low back in sitting     Manual Therapy   Joint Mobilization  hip LAD grade I-IV    Manual Traction  Cervical manual traction, suboccipital release      Neck Exercises: Stretches   Upper Trapezius Stretch  Right;Left;3 reps;20 seconds    Upper Trapezius Stretch Limitations  supine manual stretches    Levator Stretch  Right;Left;3 reps;20 seconds    Levator Stretch Limitations  supine manual stretches             PT Education - 02/09/19 1532    Education Details  POC    Person(s) Educated  Patient    Methods  Explanation    Comprehension  Verbalized understanding          PT Long Term Goals - 02/01/19 1108       PT LONG TERM GOAL #1   Title  Patient demonstrates & verbalizes understanding of on-going fitness plan / HEP.  (Target Date: 02/15/2019)    Status  On-going      PT LONG TERM GOAL #2   Title  improve functional ROM of bilat shoulders to allow ease in performing IADLs ( 02/15/2019)    Status  On-going      PT LONG TERM GOAL #3   Title  report =/> 50% reduction of pain with daily activities. ( 02/15/2019)    Baseline  no change      PT LONG TERM GOAL #4   Title  ambulate in the community with no more than 4/10 pain ( 02/15/2019)    Baseline  no change, pain stays atleast 6/10    Status  On-going      PT LONG TERM GOAL #5   Title  ---            Plan - 02/09/19 1533    Clinical Impression Statement  Limited progress with therapy to date and ultimately plan MD follow up for further tx. options given ongoing pain. Advised pt. to check with insurance re: therapy requirements before being able to get updated MRI as ordered by MD will which guide further tx. options,    Personal Factors and Comorbidities  Behavior Pattern;Past/Current Experience;Comorbidity 3+    Examination-Activity Limitations  Sleep    Examination-Participation Restrictions  Other    Stability/Clinical Decision Making  Evolving/Moderate complexity    Clinical Decision Making  Moderate    Rehab Potential  Fair    PT Frequency  2x / week    PT Duration  4 weeks    PT Treatment/Interventions  Patient/family education;Moist Heat;Passive range of motion;Therapeutic exercise;Ultrasound;Traction;Cryotherapy;Electrical Stimulation;Manual techniques;Dry needling;Spinal Manipulations    PT Next Visit Plan  progress to functional exercise along with stabilization of the cervical and lumbar region, assess continuation of cervical flexion based program    PT Home Exercise Plan  decompression series, hip adduction active stretch in sitting.   scapula retraction  bilaterlaly    Consulted and Agree with Plan of Care  Patient        Patient will benefit from skilled therapeutic intervention in order to improve the following deficits and impairments:  Decreased range of motion, Obesity, Impaired UE functional use, Pain, Decreased strength, Postural dysfunction  Visit Diagnosis: Muscle weakness (generalized)  Pain in extremity, unspecified extremity  Other symptoms and signs involving the musculoskeletal system     Problem List Patient Active Problem List   Diagnosis Date Noted  . Chronic respiratory failure with hypoxia ([place]) 04/12/2018  . Bilateral lower extremity edema 12/29/2017  . Atypical chest pain 12/29/2017  . Hymenoptera allergy 11/10/2017  . Diverticulosis 08/07/2017  . Family history of colonic polyps 08/07/2017  . Flatus 08/07/2017  . Myofascial pain 08/06/2017  . Seasonal and perennial allergic rhinitis 04/23/2017  . 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 Cervicalgia 04/01/2017  . Cervical spondylosis with radiculopathy 02/09/2017  . Memory difficulty 12/05/2016  . Morbid obesity ([place]) 12/05/2016  . Diarrhea in adult patient 12/03/2016  . Internal hemorrhoids 12/03/2016  . Headache 11/13/2016  . Migraine without aura and without status migrainosus, not intractable 10/30/2016  . Irritant contact dermatitis due to frequent handwashing 06/08/2016  . Lumbar radiculopathy 05/15/2016  . Osteoarthritis of spine with radiculopathy, lumbar region 05/15/2016  . Risk for falls 05/15/2016  . Acute medial meniscal tear, left, subsequent encounter 04/23/2016  . Recurrent infections 03/29/2016  . Chronic nonseasonal allergic rhinitis due to pollen 03/29/2016  . Polypharmacy 01/16/2016  . Morbid obesity with BMI of 50.0-59.9, adult ([place]) 03/06/2015  . SDAT 02/05/2015  . Asthma 02/05/2015  . OSA and COPD overlap syndrome ([place]) 02/05/2015  . Medication management 08/02/2014  . GERD (gastroesophageal reflux disease) 05/09/2014  . Vitamin D deficiency 08/01/2013  . Prediabetes 08/01/2013  . Positive TB  test 07/29/2011  . Diverticula of colon 05/07/2011  . Hypertension 01/31/2011  . Hyperlipidemia, mixed 01/31/2011  . BPH (benign prostatic hyperplasia) 01/31/2011  . Testosterone Deficiency 01/31/2011  . IBS (irritable bowel syndrome) 01/31/2011  . Partial complex seizure disorder with intractable epilepsy ([place]) 01/31/2011  . Depression, major, recurrent, in partial remission ([place]) 01/31/2011  . Asthma-COPD overlap syndrome ([place]) 01/31/2011    Beaulah Dinning, PT, DPT 02/09/19 3:42 PM  [facility] [address] [place], [place], [address] Phone: [phone]   Fax:  [phone]  Name: [place] MRN: [id number] Date of Birth: [birth year]

## 2019-02-14 ENCOUNTER — Other Ambulatory Visit: Payer: Self-pay

## 2019-02-14 ENCOUNTER — Ambulatory Visit: Payer: BC Managed Care – PPO | Admitting: Physical Therapy

## 2019-02-14 ENCOUNTER — Encounter: Payer: Self-pay | Admitting: Physical Therapy

## 2019-02-14 DIAGNOSIS — R29898 Other symptoms and signs involving the musculoskeletal system: Secondary | ICD-10-CM

## 2019-02-14 DIAGNOSIS — M79609 Pain in unspecified limb: Secondary | ICD-10-CM | POA: Diagnosis not present

## 2019-02-14 DIAGNOSIS — M6281 Muscle weakness (generalized): Secondary | ICD-10-CM | POA: Diagnosis not present

## 2019-02-14 NOTE — Therapy (Signed)
[place], [place], [address] Phone: [phone]   Fax:  [phone]  Physical Therapy Treatment  Patient Details  Name: Mark [place]rell MRN: [id number] Date of Birth: 1961/04/22 Referring Provider (PT): Mack Guise PA-C [name]   Encounter Date: 02/14/2019  PT End of Session - 02/14/19 1516    Visit Number  8    Number of Visits  10    Date for PT Re-Evaluation  02/21/19    Authorization Type  BCBS    PT Start Time  1446    PT Stop Time  1537    PT Time Calculation (min)  51 min    Activity Tolerance  Patient limited by pain    Behavior During Therapy  [facility] for tasks assessed/performed       Past Medical History:  Diagnosis Date  . Anesthesia complication requiring reversal agent administration    ? from central apnea, very difficult to get off vent  . Anxiety   . Arthritis    osteo  . Asthma   . BPH (benign prostatic hyperplasia)   . Complication of anesthesia    difficulty waking , they twlight me because of my respiratory problems "  . Dyspnea   . Enlarged heart   . Family history of adverse reaction to anesthesia    mother trouble waking up, and heart stopped  . GERD (gastroesophageal reflux disease)   . Headache    botox injections for headaches  . Hyperlipidemia   . Hypertension   . Hypogonadism male   . IBS (irritable bowel syndrome)   . Obesity   . OSA (obstructive sleep apnea)    cpap  . Pneumonia   . Pre-diabetes   . Prostatitis     Past Surgical History:  Procedure Laterality Date  . ABDOMINAL SURGERY    . ANKLE FRACTURE SURGERY Right   . CYSTOSCOPY     [place]  . KNEE ARTHROSCOPY WITH MEDIAL MENISECTOMY Left 01/02/2017   Procedure: LEFT KNEE ARTHROSCOPY WITH PARTIAL MEDIAL MENISCECTOMY;  Surgeon: Mcarthur Rossetti, MD;  Location: [facility];  Service: Orthopedics;  Laterality: Left;  . TONSILLECTOMY    . [place] RESECTION  2007  . UVULOPALATOPHARYNGOPLASTY       There were no vitals filed for this visit.                    [facility] Adult PT Treatment/Exercise - 02/14/19 0001      Neck Exercises: Supine   Neck Retraction  15 reps    Neck Retraction Limitations  supine with manual assistance    Capital Flexion Limitations  15 reps assisted retraction to flecion      Lumbar Exercises: Stretches   Lower Trunk Rotation Limitations  manually assisted right LTR x 15 reps      Lumbar Exercises: Supine   Pelvic Tilt  15 reps    Clam  15 reps    Clam Limitations  Green Theraband    Other Supine Lumbar Exercises  glut set x 15 reps    Other Supine Lumbar Exercises  hip add. isometric x 15 reps      Moist Heat Therapy   Number Minutes Moist Heat  10 Minutes    Moist Heat Location  --   lumbar and cervical in sitting     Manual Therapy   Joint Mobilization  hip LAD grade I-IV both sides    Manual Traction  Cervical manual traction, suboccipital release  Neck Exercises: Stretches   Upper Trapezius Stretch  Left;3 reps;30 seconds    Upper Trapezius Stretch Limitations  supine manual stretch    Levator Stretch  Left;3 reps;30 seconds    Levator Stretch Limitations  supine manual stretch             PT Education - 02/14/19 1515    Education Details  POC    Person(s) Educated  Patient    Methods  Explanation    Comprehension  Verbalized understanding;Returned demonstration          PT Long Term Goals - 02/14/19 1516      PT LONG TERM GOAL #1   Title  Patient demonstrates & verbalizes understanding of on-going fitness plan / HEP.  (Target Date: 02/15/2019)    Baseline  limited ability/tolerance due to pain    Time  4    Period  Weeks    Status  On-going      PT LONG TERM GOAL #2   Title  improve functional ROM of bilat shoulders to allow ease in performing IADLs ( 02/15/2019)    Time  4    Period  Weeks    Status  On-going      PT LONG TERM GOAL #3   Title  report =/> 50% reduction of pain with daily  activities. ( 02/15/2019)    Baseline  no change    Time  4    Period  Weeks    Status  On-going      PT LONG TERM GOAL #4   Title  ambulate in the community with no more than 4/10 pain ( 02/15/2019)    Baseline  pain 6-7/10    Time  4    Period  Weeks    Status  On-going            Plan - 02/14/19 1518    Clinical Impression Statement  As previously limited progress with therapy to date with continued pain and symptoms for both cervical and lumbar region. Pt. is pending MD visit 02/17/19 so recommend follow up for further tx. options given lack of improvement with therapy to date. Plan d/c from therapy if no significant change in status by 10 visit.    Personal Factors and Comorbidities  Behavior Pattern;Past/Current Experience;Comorbidity 3+    Examination-Activity Limitations  Sleep    Examination-Participation Restrictions  Other    Stability/Clinical Decision Making  Evolving/Moderate complexity    Clinical Decision Making  Moderate    Rehab Potential  Fair    PT Frequency  2x / week    PT Duration  4 weeks    PT Treatment/Interventions  Patient/family education;Moist Heat;Passive range of motion;Therapeutic exercise;Ultrasound;Traction;Cryotherapy;Electrical Stimulation;Manual techniques;Dry needling;Spinal Manipulations    PT Next Visit Plan  Continue POC 1-2 more visits and d/c if no significant improvement, MD follow up 02/17/19.    PT Home Exercise Plan  decompression series, hip adduction active stretch in sitting.   scapula retraction  bilaterally    Consulted and Agree with Plan of Care  Patient       Patient will benefit from skilled therapeutic intervention in order to improve the following deficits and impairments:  Decreased range of motion, Obesity, Impaired UE functional use, Pain, Decreased strength, Postural dysfunction  Visit Diagnosis: Muscle weakness (generalized)  Pain in extremity, unspecified extremity  Other symptoms and signs involving the  musculoskeletal system     Problem List Patient Active Problem List   Diagnosis Date Noted  .  Chronic respiratory failure with hypoxia ([place]) 04/12/2018  . Bilateral lower extremity edema 12/29/2017  . Atypical chest pain 12/29/2017  . Hymenoptera allergy 11/10/2017  . Diverticulosis 08/07/2017  . Family history of colonic polyps 08/07/2017  . Flatus 08/07/2017  . Myofascial pain 08/06/2017  . Seasonal and perennial allergic rhinitis 04/23/2017  . 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 Cervicalgia 04/01/2017  . Cervical spondylosis with radiculopathy 02/09/2017  . Memory difficulty 12/05/2016  . Morbid obesity ([place]) 12/05/2016  . Diarrhea in adult patient 12/03/2016  . Internal hemorrhoids 12/03/2016  . Headache 11/13/2016  . Migraine without aura and without status migrainosus, not intractable 10/30/2016  . Irritant contact dermatitis due to frequent handwashing 06/08/2016  . Lumbar radiculopathy 05/15/2016  . Osteoarthritis of spine with radiculopathy, lumbar region 05/15/2016  . Risk for falls 05/15/2016  . Acute medial meniscal tear, left, subsequent encounter 04/23/2016  . Recurrent infections 03/29/2016  . Chronic nonseasonal allergic rhinitis due to pollen 03/29/2016  . Polypharmacy 01/16/2016  . Morbid obesity with BMI of 50.0-59.9, adult ([place]) 03/06/2015  . SDAT 02/05/2015  . Asthma 02/05/2015  . OSA and COPD overlap syndrome ([place]) 02/05/2015  . Medication management 08/02/2014  . GERD (gastroesophageal reflux disease) 05/09/2014  . Vitamin D deficiency 08/01/2013  . Prediabetes 08/01/2013  . 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 Diverticula of colon 05/07/2011  . Hypertension 01/31/2011  . Hyperlipidemia, mixed 01/31/2011  . BPH (benign prostatic hyperplasia) 01/31/2011  . Testosterone Deficiency 01/31/2011  . IBS (irritable bowel syndrome) 01/31/2011  . Partial complex seizure disorder with intractable epilepsy ([place]) 01/31/2011  . Depression, major, recurrent, in  partial remission ([place]) 01/31/2011  . Asthma-COPD overlap syndrome ([place]) 01/31/2011   Beaulah Dinning, PT, DPT 02/14/19 3:33 PM  [facility] [address] [place], [place], [address] Phone: [phone]   Fax:  [phone]  Name: TRAYSEN SHIERS MRN: [id number] Date of Birth: 12-31-1960

## 2019-02-15 DIAGNOSIS — J449 Chronic obstructive pulmonary disease, unspecified: Secondary | ICD-10-CM | POA: Diagnosis not present

## 2019-02-15 DIAGNOSIS — J9611 Chronic respiratory failure with hypoxia: Secondary | ICD-10-CM | POA: Diagnosis not present

## 2019-02-16 ENCOUNTER — Other Ambulatory Visit: Payer: Self-pay

## 2019-02-16 ENCOUNTER — Ambulatory Visit: Payer: BC Managed Care – PPO | Admitting: Physical Therapy

## 2019-02-16 ENCOUNTER — Encounter: Payer: Self-pay | Admitting: Physical Therapy

## 2019-02-16 DIAGNOSIS — M6281 Muscle weakness (generalized): Secondary | ICD-10-CM | POA: Diagnosis not present

## 2019-02-16 DIAGNOSIS — R29898 Other symptoms and signs involving the musculoskeletal system: Secondary | ICD-10-CM | POA: Diagnosis not present

## 2019-02-16 DIAGNOSIS — M79609 Pain in unspecified limb: Secondary | ICD-10-CM | POA: Diagnosis not present

## 2019-02-16 NOTE — Therapy (Signed)
[place], [place], [address] Phone: [phone]   Fax:  [phone]  Physical Therapy Treatment  Patient Details  Name: Mark Harrell MRN: [id number] Date of Birth: Sep 30, 1960 Referring Provider (PT): Mack Guise PA-C [name]   Encounter Date: 02/16/2019  PT End of Session - 02/16/19 1618    Visit Number  9    Number of Visits  10    Date for PT Re-Evaluation  02/21/19    Authorization Type  BC[employer]    PT Start Time  1546    PT Stop Time  1638    PT Time Calculation (min)  52 min    Activity Tolerance  Patient limited by pain    Behavior During Therapy  [facility] for tasks assessed/performed       Past Medical History:  Diagnosis Date  . Anesthesia complication requiring reversal agent administration    ? from central apnea, very difficult to get off vent  . Anxiety   . Arthritis    osteo  . Asthma   . BPH (benign prostatic hyperplasia)   . Complication of anesthesia    difficulty waking , they twlight me because of my respiratory problems "  . Dyspnea   . Enlarged heart   . Family history of adverse reaction to anesthesia    mother trouble waking up, and heart stopped  . GERD (gastroesophageal reflux disease)   . Headache    botox injections for headaches  . Hyperlipidemia   . Hypertension   . Hypogonadism male   . IBS (irritable bowel syndrome)   . Obesity   . OSA (obstructive sleep apnea)    cpap  . Pneumonia   . Pre-diabetes   . Prostatitis     Past Surgical History:  Procedure Laterality Date  . ABDOMINAL SURGERY    . ANKLE FRACTURE SURGERY Right   . CYSTOSCOPY     [place]  . KNEE ARTHROSCOPY WITH MEDIAL MENISECTOMY Left 01/02/2017   Procedure: LEFT KNEE ARTHROSCOPY WITH PARTIAL MEDIAL MENISCECTOMY;  Surgeon: Mcarthur Rossetti, MD;  Location: [facility];  Service: Orthopedics;  Laterality: Left;  . TONSILLECTOMY    . [place] RESECTION  2007  . UVULOPALATOPHARYNGOPLASTY       There were no vitals filed for this visit.  Subjective Assessment - 02/16/19 1547    Subjective  "A little more sore today." Pt. continues with neck and back symptoms with left>right UE radiating symptoms from neck as well as left>right LE radiating pain from low back. Pt. sees Dr. [name] for follow up tomorrow.                       [place] Adult PT Treatment/Exercise - 02/16/19 0001      Neck Exercises: Supine   Neck Retraction  15 reps    Neck Retraction Limitations  supine with manual assistance      Lumbar Exercises: Stretches   Lower Trunk Rotation Limitations  manually assisted right LTR x 15 reps      Lumbar Exercises: Supine   Pelvic Tilt  15 reps    Clam  15 reps    Clam Limitations  Green Theraband    Other Supine Lumbar Exercises  glut set x 15 reps    Other Supine Lumbar Exercises  hip add. isometric x 15 reps      Moist Heat Therapy   Number Minutes Moist Heat  10 Minutes    Moist Heat  Location  --   lumbar and cervical in sitting     Manual Therapy   Joint Mobilization  hip LAD grade I-IV both sides    Manual Traction  Cervical manual traction, suboccipital release      Neck Exercises: Stretches   Upper Trapezius Stretch  Right;Left;3 reps;30 seconds    Upper Trapezius Stretch Limitations  supine manual stretch    Levator Stretch  Right;Left;3 reps;30 seconds    Levator Stretch Limitations  supine manual stretch             PT Education - 02/16/19 1618    Education Details  POC    Person(s) Educated  Patient    Methods  Explanation    Comprehension  Verbalized understanding          PT Long Term Goals - 02/14/19 1516      PT LONG TERM GOAL #1   Title  Patient demonstrates & verbalizes understanding of on-going fitness plan / HEP.  (Target Date: 02/15/2019)    Baseline  limited ability/tolerance due to pain    Time  4    Period  Weeks    Status  On-going      PT LONG TERM GOAL #2   Title  improve functional ROM of bilat  shoulders to allow ease in performing IADLs ( 02/15/2019)    Time  4    Period  Weeks    Status  On-going      PT LONG TERM GOAL #3   Title  report =/> 50% reduction of pain with daily activities. ( 02/15/2019)    Baseline  no change    Time  4    Period  Weeks    Status  On-going      PT LONG TERM GOAL #4   Title  ambulate in the community with no more than 4/10 pain ( 02/15/2019)    Baseline  pain 6-7/10    Time  4    Period  Weeks    Status  On-going            Plan - 02/16/19 1619    Clinical Impression Statement  Limited progress as previously with therapy to date for neck and lumbar regions with continued radicular symptoms. Pt. will see MD tomorrow for further status and if no significant improvement with therapy plan d/c.    Personal Factors and Comorbidities  Behavior Pattern;Past/Current Experience;Comorbidity 3+    Examination-Activity Limitations  Sleep    Examination-Participation Restrictions  Other    Stability/Clinical Decision Making  Evolving/Moderate complexity    Clinical Decision Making  Moderate    Rehab Potential  Fair    PT Frequency  2x / week    PT Duration  4 weeks    PT Treatment/Interventions  Patient/family education;Moist Heat;Passive range of motion;Therapeutic exercise;Ultrasound;Traction;Cryotherapy;Electrical Stimulation;Manual techniques;Dry needling;Spinal Manipulations    PT Next Visit Plan  d/c by next visit if no significant improvement, MD follow up 02/17/19.    PT Home Exercise Plan  decompression series, hip adduction active stretch in sitting.   scapula retraction  bilaterally    Consulted and Agree with Plan of Care  Patient       Patient will benefit from skilled therapeutic intervention in order to improve the following deficits and impairments:  Decreased range of motion, Obesity, Impaired UE functional use, Pain, Decreased strength, Postural dysfunction  Visit Diagnosis: Muscle weakness (generalized)  Pain in extremity,  unspecified extremity  Other symptoms and signs involving  the musculoskeletal system     Problem List Patient Active Problem List   Diagnosis Date Noted  . Chronic respiratory failure with hypoxia ([employer]) 04/12/2018  . Bilateral lower extremity edema 12/29/2017  . Atypical chest pain 12/29/2017  . Hymenoptera allergy 11/10/2017  . Diverticulosis 08/07/2017  . Family history of colonic polyps 08/07/2017  . Flatus 08/07/2017  . Myofascial pain 08/06/2017  . Seasonal and perennial allergic rhinitis 04/23/2017  . 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 Cervicalgia 04/01/2017  . Cervical spondylosis with radiculopathy 02/09/2017  . Memory difficulty 12/05/2016  . Morbid obesity ([place]) 12/05/2016  . Diarrhea in adult patient 12/03/2016  . Internal hemorrhoids 12/03/2016  . Headache 11/13/2016  . Migraine without aura and without status migrainosus, not intractable 10/30/2016  . Irritant contact dermatitis due to frequent handwashing 06/08/2016  . Lumbar radiculopathy 05/15/2016  . Osteoarthritis of spine with radiculopathy, lumbar region 05/15/2016  . Risk for falls 05/15/2016  . Acute medial meniscal tear, left, subsequent encounter 04/23/2016  . Recurrent infections 03/29/2016  . Chronic nonseasonal allergic rhinitis due to pollen 03/29/2016  . Polypharmacy 01/16/2016  . Morbid obesity with BMI of 50.0-59.9, adult ([place]) 03/06/2015  . SDAT 02/05/2015  . Asthma 02/05/2015  . OSA and COPD overlap syndrome ([place]) 02/05/2015  . Medication management 08/02/2014  . GERD (gastroesophageal reflux disease) 05/09/2014  . Vitamin D deficiency 08/01/2013  . Prediabetes 08/01/2013  . 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 Diverticula of colon 05/07/2011  . Hypertension 01/31/2011  . Hyperlipidemia, mixed 01/31/2011  . BPH (benign prostatic hyperplasia) 01/31/2011  . Testosterone Deficiency 01/31/2011  . IBS (irritable bowel syndrome) 01/31/2011  . Partial complex seizure disorder with intractable  epilepsy ([place]) 01/31/2011  . Depression, major, recurrent, in partial remission ([place]) 01/31/2011  . Asthma-COPD overlap syndrome ([place]) 01/31/2011   Beaulah Dinning, PT, DPT 02/16/19 4:29 PM  [facility] [address] [place], [place], [address] Phone: [phone]   Fax:  [phone]  Name: Mark Harrell MRN: [id number] Date of Birth: Feb 22, 1961

## 2019-02-17 ENCOUNTER — Encounter: Payer: Self-pay | Admitting: Specialist

## 2019-02-17 ENCOUNTER — Ambulatory Visit (INDEPENDENT_AMBULATORY_CARE_PROVIDER_SITE_OTHER): Payer: BC Managed Care – PPO | Admitting: Specialist

## 2019-02-17 VITALS — BP 110/74 | HR 85 | Ht 72.0 in | Wt 350.0 lb

## 2019-02-17 DIAGNOSIS — M5126 Other intervertebral disc displacement, lumbar region: Secondary | ICD-10-CM | POA: Diagnosis not present

## 2019-02-17 DIAGNOSIS — M542 Cervicalgia: Secondary | ICD-10-CM

## 2019-02-17 DIAGNOSIS — M4807 Spinal stenosis, lumbosacral region: Secondary | ICD-10-CM | POA: Diagnosis not present

## 2019-02-17 DIAGNOSIS — M4802 Spinal stenosis, cervical region: Secondary | ICD-10-CM | POA: Diagnosis not present

## 2019-02-17 NOTE — Progress Notes (Signed)
Office Visit Note   Patient: Mark Harrell           Date of Birth: 1961/04/15           MRN: [id number] Visit Date: 02/17/2019              Requested by: [name], [address] [place] [address] [place],  [place] [address] PCP: [name], MD   Assessment & Plan: Visit Diagnoses:  1. Cervicalgia     Plan:Plan: Avoid overhead lifting and overhead use of the arms. Do not lift greater than 5 lbs. Adjust head rest in vehicle to prevent hyperextension if rear ended. Take extra precautions to avoid falling, including use of a cane if you feel weak. Scheduling secretary Kandice Hams. will call you to arrange for surgery for your cervical spine. If you wish a second opinion please let us know and we can arrange for you. If you have worsening arm or leg numbness or weakness please call or go to an ER. We will contact your cardiologist and primary care physicians to seek clearance for your surgery. Surgery will be an anterior cervical discectomy and fusion at the C5-6 and C6-7 levels with decompression of the cervical spinal canal at C6-7 and C5-6 and removal of spur and disc pressing on the spinal cord. An additional decompression fusion is done at the C5-6 and C6-7 level with bone grafting and plate and screws, Local bone graft as well and allograft bone graft and vivigen.  Risks of surgery include risks of infection, bleeding and risks to the spinal cord and  Risks of sore throat and difficulty swallowing which should  Improve over the next 4-6 weeks following surgery. Surgery is indicated due to upper extremity radiculopathy, lermittes phenomena and falls. In the future surgery at adjacent levels may be necessary but these levels do not appear to be related to your current symptoms or signs. Repeat MRI of the lumbar spine and cervical spine as the patient is nearly 10 months post last cervical MRI and Lumbar MRI   Follow-Up Instructions: No follow-ups on file.    Orders:  No orders of the defined types were placed in this encounter.  No orders of the defined types were placed in this encounter.     Procedures: No procedures performed   Clinical Data: No additional findings.   Subjective: Chief Complaint  Patient presents with  . Left Thumb - Follow-up    58 year old male with condition of cervical stenosis and pain in the shoulders and into the lower back. Mark Harrell reports that we were considering surgical solution for his cervical condition and then with the COVID-19 pandemic surgery was put off. Mark Harrell reports that the insurance company requested more conservative treatment. Mark Harrell also has had problems with degenerative changes of the left thumb MC-C joint. Mark Harrell reports both the cardiologist and his primary care had given the okay for surge. Low back pain with bending and stooping and with bending over to brush his teeth is painful. Lying down is uncomfortable. Mark Harrell can not walk a mile but can grocery shop, leaning is uncomfortable also.    Review of Systems  Constitutional: Negative.  Negative for activity change, appetite change, chills, diaphoresis, fatigue, fever and unexpected weight change (lost about 75 lbs from peak).  HENT: Negative for congestion, dental problem, drooling, ear pain, facial swelling, hearing loss, mouth sores, nosebleeds, postnasal drip, rhinorrhea, sinus pain and trouble swallowing.   Eyes: Positive for visual disturbance (Reading glasses).  Respiratory: Positive for wheezing (history of asthma and bronchitis). Negative for apnea, cough, choking, chest tightness, shortness of breath and stridor.   Cardiovascular: Negative for chest pain, palpitations and leg swelling.  Gastrointestinal: Negative.  Negative for abdominal distention, abdominal pain, anal bleeding, blood in stool, constipation, diarrhea and nausea.  Endocrine: Negative.   Genitourinary: Positive for frequency and urgency. Negative for dysuria, enuresis, flank pain,  genital sores and hematuria.  Musculoskeletal: Positive for back pain, neck pain and neck stiffness. Negative for arthralgias, gait problem, joint swelling and myalgias.  Skin: Negative.   Allergic/Immunologic: Negative for environmental allergies, food allergies and immunocompromised state.  Neurological: Positive for weakness and numbness.  Hematological: Negative for adenopathy. Does not bruise/bleed easily.  Psychiatric/Behavioral: Negative.  Negative for agitation, behavioral problems, confusion, decreased concentration, dysphoric mood, hallucinations, self-injury, sleep disturbance and suicidal ideas. The patient is not nervous/anxious and is not hyperactive.      Objective: Vital Signs: BP 110/74 (BP Location: Left Arm, Patient Position: Sitting)   Pulse 85   Ht 6' (1.829 m)   Wt (!) 350 lb (158.8 kg)   BMI 47.47 kg/m   Physical Exam Constitutional:      Appearance: Mark Harrell is well-developed.  HENT:     Head: Normocephalic and atraumatic.  Eyes:     Pupils: Pupils are equal, round, and reactive to light.  Neck:     Musculoskeletal: Normal range of motion and neck supple.  Pulmonary:     Effort: Pulmonary effort is normal.     Breath sounds: Normal breath sounds.  Abdominal:     General: Bowel sounds are normal.     Palpations: Abdomen is soft.  Skin:    General: Skin is warm and dry.  Neurological:     Mental Status: Mark Harrell is alert and oriented to person, place, and time.  Psychiatric:        Behavior: Behavior normal.        Thought Content: Thought content normal.        Judgment: Judgment normal.     Back Exam   Tenderness  The patient is experiencing tenderness in the cervical.  Range of Motion  Extension:  40 abnormal  Flexion:  50 abnormal  Lateral bend right:  50 abnormal  Lateral bend left:  50 abnormal  Rotation right: 50  Rotation left: 60   Muscle Strength  Right Quadriceps:  5/5  Left Quadriceps:  5/5  Right Hamstrings:  5/5  Left Hamstrings:   5/5   Tests  Straight leg raise right: negative Straight leg raise left: negative  Reflexes  Patellar: 2/4 Achilles: 2/4 Biceps: 2/4 Babinski's sign: normal   Other  Toe walk: normal Heel walk: normal Sensation: normal Gait: normal   Comments:  Hoffman positive right greater than left.      Specialty Comments:  No specialty comments available.  Imaging: No results found.   PMFS History: Patient Active Problem List   Diagnosis Date Noted  . Chronic respiratory failure with hypoxia ([place]) 04/12/2018  . Bilateral lower extremity edema 12/29/2017  . Atypical chest pain 12/29/2017  . Hymenoptera allergy 11/10/2017  . Diverticulosis 08/07/2017  . Family history of colonic polyps 08/07/2017  . Flatus 08/07/2017  . Myofascial pain 08/06/2017  . Seasonal and perennial allergic rhinitis 04/23/2017  . 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 Cervicalgia 04/01/2017  . Cervical spondylosis with radiculopathy 02/09/2017  . Memory difficulty 12/05/2016  . Morbid obesity ([place]) 12/05/2016  . Diarrhea in adult patient 12/03/2016  .  Internal hemorrhoids 12/03/2016  . Headache 11/13/2016  . Migraine without aura and without status migrainosus, not intractable 10/30/2016  . Irritant contact dermatitis due to frequent handwashing 06/08/2016  . Lumbar radiculopathy 05/15/2016  . Osteoarthritis of spine with radiculopathy, lumbar region 05/15/2016  . Risk for falls 05/15/2016  . Acute medial meniscal tear, left, subsequent encounter 04/23/2016  . Recurrent infections 03/29/2016  . Chronic nonseasonal allergic rhinitis due to pollen 03/29/2016  . Polypharmacy 01/16/2016  . Morbid obesity with BMI of 50.0-59.9, adult ([place]) 03/06/2015  . SDAT 02/05/2015  . Asthma 02/05/2015  . OSA and COPD overlap syndrome ([place]) 02/05/2015  . Medication management 08/02/2014  . GERD (gastroesophageal reflux disease) 05/09/2014  . Vitamin D deficiency 08/01/2013  . Prediabetes 08/01/2013  . Positive  TB test 07/29/2011  . Diverticula of colon 05/07/2011  . Hypertension 01/31/2011  . Hyperlipidemia, mixed 01/31/2011  . BPH (benign prostatic hyperplasia) 01/31/2011  . Testosterone Deficiency 01/31/2011  . IBS (irritable bowel syndrome) 01/31/2011  . Partial complex seizure disorder with intractable epilepsy ([place]) 01/31/2011  . Depression, major, recurrent, in partial remission ([place]) 01/31/2011  . Asthma-COPD overlap syndrome ([place]) 01/31/2011   Past Medical History:  Diagnosis Date  . Anesthesia complication requiring reversal agent administration    ? from central apnea, very difficult to get off vent  . Anxiety   . Arthritis    osteo  . Asthma   . BPH (benign prostatic hyperplasia)   . Complication of anesthesia    difficulty waking , they twlight me because of my respiratory problems "  . Dyspnea   . Enlarged heart   . Family history of adverse reaction to anesthesia    mother trouble waking up, and heart stopped  . GERD (gastroesophageal reflux disease)   . Headache    botox injections for headaches  . Hyperlipidemia   . Hypertension   . Hypogonadism male   . IBS (irritable bowel syndrome)   . Obesity   . OSA (obstructive sleep apnea)    cpap  . Pneumonia   . Pre-diabetes   . Prostatitis     Family History  Problem Relation Age of Onset  . Diabetes Paternal Uncle   . Cancer Father        lymphoma, colon  . Diabetes Maternal Grandmother   . Heart disease Maternal Grandfather   . Diabetes Maternal Grandfather   . Diabetes Paternal Grandmother   . Diabetes Paternal Grandfather   . Dementia Mother   . Prostate cancer Maternal Uncle   . Lung disease Neg Hx   . Rheumatologic disease Neg Hx     Past Surgical History:  Procedure Laterality Date  . ABDOMINAL SURGERY    . ANKLE FRACTURE SURGERY Right   . CYSTOSCOPY     [place]  . KNEE ARTHROSCOPY WITH MEDIAL MENISECTOMY Left 01/02/2017   Procedure: LEFT KNEE ARTHROSCOPY WITH PARTIAL MEDIAL MENISCECTOMY;   Surgeon: Mcarthur Rossetti, MD;  Location: [facility];  Service: Orthopedics;  Laterality: Left;  . TONSILLECTOMY    . [place] RESECTION  2007  . UVULOPALATOPHARYNGOPLASTY     Social History   Occupational History  . Occupation: Dance movement psychotherapist  Tobacco Use  . Smoking status: Former Smoker    Packs/day: 0.10    Years: 15.00    Pack years: 1.50  . Smokeless tobacco: Never Used  . Tobacco comment: significant second-hand exposure through mother  Substance and Sexual Activity  . Alcohol use: Yes    Alcohol/week: 1.0 standard drinks  Types: 1 Glasses of wine per week    Comment: 2 x a year  . Drug use: No  . Sexual activity: Never

## 2019-02-17 NOTE — Patient Instructions (Signed)
Plan: Avoid overhead lifting and overhead use of the arms. Do not lift greater than 5 lbs. Adjust head rest in vehicle to prevent hyperextension if rear ended. Take extra precautions to avoid falling, including use of a cane if you feel weak. Scheduling secretary Kandice Hams. will call you to arrange for surgery for your cervical spine. If you wish a second opinion please let us know and we can arrange for you. If you have worsening arm or leg numbness or weakness please call or go to an ER. We will contact your cardiologist and primary care physicians to seek clearance for your surgery. Surgery will be an anterior cervical discectomy and fusion at the C5-6 and C6-7 levels with decompression of the cervical spinal canal at C6-7 and C5-6 and removal of spur and disc pressing on the spinal cord. An additional decompression fusion is done at the C5-6 and C6-7 level with bone grafting and plate and screws, Local bone graft as well and allograft bone graft and vivigen.  Risks of surgery include risks of infection, bleeding and risks to the spinal cord and  Risks of sore throat and difficulty swallowing which should  Improve over the next 4-6 weeks following surgery. Surgery is indicated due to upper extremity radiculopathy, lermittes phenomena and falls. In the future surgery at adjacent levels may be necessary but these levels do not appear to be related to your current symptoms or signs. Repeat MRI of the lumbar spine and cervical spine as the patient is nearly 10 months post last cervical MRI and Lumbar MRI

## 2019-02-19 DIAGNOSIS — G4733 Obstructive sleep apnea (adult) (pediatric): Secondary | ICD-10-CM | POA: Diagnosis not present

## 2019-02-19 DIAGNOSIS — J449 Chronic obstructive pulmonary disease, unspecified: Secondary | ICD-10-CM | POA: Diagnosis not present

## 2019-02-19 DIAGNOSIS — R069 Unspecified abnormalities of breathing: Secondary | ICD-10-CM | POA: Diagnosis not present

## 2019-02-21 ENCOUNTER — Ambulatory Visit: Payer: BC Managed Care – PPO | Admitting: Physical Therapy

## 2019-02-21 ENCOUNTER — Other Ambulatory Visit: Payer: Self-pay

## 2019-02-21 ENCOUNTER — Encounter: Payer: Self-pay | Admitting: Physical Therapy

## 2019-02-21 DIAGNOSIS — M79609 Pain in unspecified limb: Secondary | ICD-10-CM | POA: Diagnosis not present

## 2019-02-21 DIAGNOSIS — R29898 Other symptoms and signs involving the musculoskeletal system: Secondary | ICD-10-CM | POA: Diagnosis not present

## 2019-02-21 DIAGNOSIS — M6281 Muscle weakness (generalized): Secondary | ICD-10-CM | POA: Diagnosis not present

## 2019-02-21 NOTE — Therapy (Signed)
[place], [place], [address] Phone: [phone]   Fax:  [phone]  Physical Therapy Treatment/Discharge  Patient Details  Name: Mark Harrell MRN: [id number] Date of Birth: [birth year] Referring Provider (PT): Mack Guise PA-C [name]   Encounter Date: 02/21/2019  PT End of Session - 02/21/19 1733    Visit Number  10    Number of Visits  10    Date for PT Re-Evaluation  02/21/19    Authorization Type  [employer]    PT Start Time  5974    PT Stop Time  1635    PT Time Calculation (min)  50 min    Activity Tolerance  Patient limited by pain    Behavior During Therapy  [facility] for tasks assessed/performed       Past Medical History:  Diagnosis Date  . Anesthesia complication requiring reversal agent administration    ? from central apnea, very difficult to get off vent  . Anxiety   . Arthritis    osteo  . Asthma   . BPH (benign prostatic hyperplasia)   . Complication of anesthesia    difficulty waking , they twlight me because of my respiratory problems "  . Dyspnea   . Enlarged heart   . Family history of adverse reaction to anesthesia    mother trouble waking up, and heart stopped  . GERD (gastroesophageal reflux disease)   . Headache    botox injections for headaches  . Hyperlipidemia   . Hypertension   . Hypogonadism male   . IBS (irritable bowel syndrome)   . Obesity   . OSA (obstructive sleep apnea)    cpap  . Pneumonia   . Pre-diabetes   . Prostatitis     Past Surgical History:  Procedure Laterality Date  . ABDOMINAL SURGERY    . ANKLE FRACTURE SURGERY Right   . CYSTOSCOPY     [place]  . KNEE ARTHROSCOPY WITH MEDIAL MENISECTOMY Left 01/02/2017   Procedure: LEFT KNEE ARTHROSCOPY WITH PARTIAL MEDIAL MENISCECTOMY;  Surgeon: Mcarthur Rossetti, MD;  Location: [facility];  Service: Orthopedics;  Laterality: Left;  . TONSILLECTOMY    . [place] RESECTION  2007  .  UVULOPALATOPHARYNGOPLASTY      There were no vitals filed for this visit.  Subjective Assessment - 02/21/19 1546    Subjective  Pt. saw Dr. [name] for follow up and ACDF is tentatively planned to address cervical region with pt. still pending updated MRI for both cervical and lumbar regions. He continues with high pain level and associated UE/LE radicular symptoms.    Currently in Pain?  Yes    Pain Score  7     Pain Location  --   neck and low back   Pain Orientation  Lower    Pain Descriptors / Indicators  Radiating;Sharp;Sore    Pain Type  Chronic pain    Pain Radiating Towards  left>right arm and hand, left>right leg    Pain Onset  More than a month ago    Pain Frequency  Constant    Aggravating Factors   everything    Pain Relieving Factors  no specific eases noted         [facility] PT Assessment - 02/21/19 0001      AROM   Cervical Flexion  30    Cervical Extension  20    Cervical - Right Rotation  40    Cervical - Left Rotation  40  Lumbar Flexion  40    Lumbar Extension  10    Lumbar - Right Rotation  25% with pain    Lumbar - Left Rotation  25% with pain      Strength   Overall Strength Comments  Continues with gross UE/LE weakness consistent with status at eval                   [facility] Adult PT Treatment/Exercise - 02/21/19 0001      Lumbar Exercises: Stretches   Single Knee to Chest Stretch  Right;Left;5 reps;10 seconds    Lower Trunk Rotation Limitations  manually assisted right LTR x 15 reps      Lumbar Exercises: Supine   Pelvic Tilt  15 reps    Clam  15 reps    Clam Limitations  Green Theraband    Other Supine Lumbar Exercises  glut set x 15 reps    Other Supine Lumbar Exercises  hip add. isometric x 15 reps      Moist Heat Therapy   Number Minutes Moist Heat  10 Minutes    Moist Heat Location  Cervical;Lumbar Spine      Manual Therapy   Joint Mobilization  hip LAD grade I-IV both sides             PT Education - 02/21/19 1732     Education Details  POC    Person(s) Educated  Patient    Methods  Explanation    Comprehension  Verbalized understanding          PT Long Term Goals - 02/21/19 1736      PT LONG TERM GOAL #1   Title  Patient demonstrates & verbalizes understanding of on-going fitness plan / HEP.    Baseline  limited ability/tolerance due to pain    Time  4    Period  Weeks    Status  Not Met      PT LONG TERM GOAL #2   Title  improve functional ROM of bilat shoulders to allow ease in performing IADLs    Baseline  still limited due to cervical/UE radicular pain    Time  4    Period  Weeks    Status  Not Met      PT LONG TERM GOAL #3   Title  report =/> 50% reduction of pain with daily activities.    Baseline  not met    Time  4    Period  Weeks    Status  Not Met      PT LONG TERM GOAL #4   Title  ambulate in the community with no more than 4/10 pain    Baseline  7/10 pain    Time  4    Period  Weeks    Status  Not Met            Plan - 02/21/19 1734    Clinical Impression Statement  Pt. has attended 10 therapy visits with attempts to address both cervical and lumbar pain with associated radicular symptoms. He continues with high pain level without significant improvement with therapy/conservative measures to date. D/c therapy due to lack of progress with pt. to continue to follow up with MD for further tx. options to address unresolved radicular symptoms.    Personal Factors and Comorbidities  Behavior Pattern;Past/Current Experience;Comorbidity 3+    Examination-Activity Limitations  Sleep    Examination-Participation Restrictions  Other    Stability/Clinical Decision Making  Evolving/Moderate  complexity    Clinical Decision Making  Moderate    Rehab Potential  Poor    PT Frequency  2x / week    PT Duration  4 weeks    PT Treatment/Interventions  Patient/family education;Moist Heat;Passive range of motion;Therapeutic exercise;Ultrasound;Traction;Cryotherapy;Electrical  Stimulation;Manual techniques;Dry needling;Spinal Manipulations    PT Next Visit Plan  NA    Consulted and Agree with Plan of Care  Patient       Patient will benefit from skilled therapeutic intervention in order to improve the following deficits and impairments:  Decreased range of motion, Obesity, Impaired UE functional use, Pain, Decreased strength, Postural dysfunction  Visit Diagnosis: Muscle weakness (generalized)  Pain in extremity, unspecified extremity  Other symptoms and signs involving the musculoskeletal system     Problem List Patient Active Problem List   Diagnosis Date Noted  . Chronic respiratory failure with hypoxia ([place]) 04/12/2018  . Bilateral lower extremity edema 12/29/2017  . Atypical chest pain 12/29/2017  . Hymenoptera allergy 11/10/2017  . Diverticulosis 08/07/2017  . Family history of colonic polyps 08/07/2017  . Flatus 08/07/2017  . Myofascial pain 08/06/2017  . Seasonal and perennial allergic rhinitis 04/23/2017  . 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 Cervicalgia 04/01/2017  . Cervical spondylosis with radiculopathy 02/09/2017  . Memory difficulty 12/05/2016  . Morbid obesity ([place]) 12/05/2016  . Diarrhea in adult patient 12/03/2016  . Internal hemorrhoids 12/03/2016  . Headache 11/13/2016  . Migraine without aura and without status migrainosus, not intractable 10/30/2016  . Irritant contact dermatitis due to frequent handwashing 06/08/2016  . Lumbar radiculopathy 05/15/2016  . Osteoarthritis of spine with radiculopathy, lumbar region 05/15/2016  . Risk for falls 05/15/2016  . Acute medial meniscal tear, left, subsequent encounter 04/23/2016  . Recurrent infections 03/29/2016  . Chronic nonseasonal allergic rhinitis due to pollen 03/29/2016  . Polypharmacy 01/16/2016  . Morbid obesity with BMI of 50.0-59.9, adult ([place]) 03/06/2015  . SDAT 02/05/2015  . Asthma 02/05/2015  . OSA and COPD overlap syndrome ([place]) 02/05/2015  . Medication  management 08/02/2014  . GERD (gastroesophageal reflux disease) 05/09/2014  . Vitamin D deficiency 08/01/2013  . Prediabetes 08/01/2013  . 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 Diverticula of colon 05/07/2011  . Hypertension 01/31/2011  . Hyperlipidemia, mixed 01/31/2011  . BPH (benign prostatic hyperplasia) 01/31/2011  . Testosterone Deficiency 01/31/2011  . IBS (irritable bowel syndrome) 01/31/2011  . Partial complex seizure disorder with intractable epilepsy ([place]) 01/31/2011  . Depression, major, recurrent, in partial remission ([place]) 01/31/2011  . Asthma-COPD overlap syndrome ([place]) 01/31/2011     PHYSICAL THERAPY DISCHARGE SUMMARY  Visits from Start of Care: 10  Current functional level related to goals / functional outcomes: Patient continues with high pain level with associated functional limitations for both cervical and lumbar regions despite treatment efforts to date   Remaining deficits: UE/LE weakness, decreased cervical and trunk ROM, limited positional tolerance, difficulty walking, limited ability to perform lifting and reaching activities   Education / Equipment: POC Plan: Patient agrees to discharge.  Patient goals were not met. Patient is being discharged due to lack of progress.  ?????          Beaulah Dinning, PT, DPT 02/21/19 5:42 PM    [facility] [address] [place], [place], [address] Phone: [phone]   Fax:  [phone]  Name: Mark Harrell MRN: [id number] Date of Birth: [birth year]

## 2019-02-22 ENCOUNTER — Encounter: Payer: Self-pay | Admitting: Podiatry

## 2019-02-22 ENCOUNTER — Ambulatory Visit (INDEPENDENT_AMBULATORY_CARE_PROVIDER_SITE_OTHER): Payer: BC Managed Care – PPO | Admitting: Podiatry

## 2019-02-22 DIAGNOSIS — M79676 Pain in unspecified toe(s): Secondary | ICD-10-CM

## 2019-02-22 DIAGNOSIS — B351 Tinea unguium: Secondary | ICD-10-CM

## 2019-02-22 DIAGNOSIS — M722 Plantar fascial fibromatosis: Secondary | ICD-10-CM | POA: Diagnosis not present

## 2019-02-22 DIAGNOSIS — M7661 Achilles tendinitis, right leg: Secondary | ICD-10-CM | POA: Diagnosis not present

## 2019-02-23 ENCOUNTER — Encounter: Payer: Self-pay | Admitting: Podiatry

## 2019-02-23 NOTE — Progress Notes (Signed)
Presents today for chief complaint of painful elongated toenails and he is also complaining of bilateral heel pain.  Objective: Vital signs are stable he is alert oriented x3 I reviewed his past medical history medications allergies pulses remain palpable nails are long thick yellow dystrophic-like mycotic.  He still retains some tenderness on palpation medial calcaneal tubercles bilateral.  He also has pain on palpation of the posterior aspect of the Achilles as it inserts on the calcaneus.  Does not appear to be bursitis  Assessment: Resolving plantar fasciitis bilateral.  Pain in limb secondary to onychomycosis.  Insertional Achilles tendinitis right  Plan: After sterile Betadine skin prep I injected bilateral 20 mg Kenalog 5 mg Marcaine point of maximal tenderness bilateral heels.  Also debrided toenails 1 through 5 bilateral.  Also injected dexamethasone into the posterior aspect of the right heel.  This i was injected only into the subcutaneous tissue making sure not to inject into the tendon itself.

## 2019-03-01 DIAGNOSIS — Z8371 Family history of colonic polyps: Secondary | ICD-10-CM | POA: Diagnosis not present

## 2019-03-01 DIAGNOSIS — R197 Diarrhea, unspecified: Secondary | ICD-10-CM | POA: Diagnosis not present

## 2019-03-06 ENCOUNTER — Ambulatory Visit
Admission: RE | Admit: 2019-03-06 | Discharge: 2019-03-06 | Disposition: A | Discharge status: Home / Self Care | Payer: BC Managed Care – PPO | Source: Ambulatory Visit | Attending: Specialist | Admitting: Specialist

## 2019-03-06 ENCOUNTER — Other Ambulatory Visit: Payer: Self-pay

## 2019-03-06 DIAGNOSIS — M4807 Spinal stenosis, lumbosacral region: Secondary | ICD-10-CM

## 2019-03-06 DIAGNOSIS — M48061 Spinal stenosis, lumbar region without neurogenic claudication: Secondary | ICD-10-CM | POA: Diagnosis not present

## 2019-03-06 DIAGNOSIS — M4802 Spinal stenosis, cervical region: Secondary | ICD-10-CM | POA: Diagnosis not present

## 2019-03-06 DIAGNOSIS — M542 Cervicalgia: Secondary | ICD-10-CM

## 2019-03-10 DIAGNOSIS — G43709 Chronic migraine without aura, not intractable, without status migrainosus: Secondary | ICD-10-CM | POA: Diagnosis not present

## 2019-03-17 DIAGNOSIS — J9611 Chronic respiratory failure with hypoxia: Secondary | ICD-10-CM | POA: Diagnosis not present

## 2019-03-17 DIAGNOSIS — J449 Chronic obstructive pulmonary disease, unspecified: Secondary | ICD-10-CM | POA: Diagnosis not present

## 2019-03-21 DIAGNOSIS — J449 Chronic obstructive pulmonary disease, unspecified: Secondary | ICD-10-CM | POA: Diagnosis not present

## 2019-03-21 DIAGNOSIS — G4733 Obstructive sleep apnea (adult) (pediatric): Secondary | ICD-10-CM | POA: Diagnosis not present

## 2019-03-21 DIAGNOSIS — R069 Unspecified abnormalities of breathing: Secondary | ICD-10-CM | POA: Diagnosis not present

## 2019-03-24 DIAGNOSIS — K621 Rectal polyp: Secondary | ICD-10-CM | POA: Diagnosis not present

## 2019-03-24 DIAGNOSIS — R197 Diarrhea, unspecified: Secondary | ICD-10-CM | POA: Diagnosis not present

## 2019-03-24 DIAGNOSIS — Z86010 Personal history of colonic polyps: Secondary | ICD-10-CM | POA: Diagnosis not present

## 2019-03-24 DIAGNOSIS — D123 Benign neoplasm of transverse colon: Secondary | ICD-10-CM | POA: Diagnosis not present

## 2019-03-24 DIAGNOSIS — D124 Benign neoplasm of descending colon: Secondary | ICD-10-CM | POA: Diagnosis not present

## 2019-03-24 DIAGNOSIS — Z8719 Personal history of other diseases of the digestive system: Secondary | ICD-10-CM | POA: Diagnosis not present

## 2019-03-24 DIAGNOSIS — K529 Noninfective gastroenteritis and colitis, unspecified: Secondary | ICD-10-CM | POA: Diagnosis not present

## 2019-03-24 DIAGNOSIS — K5730 Diverticulosis of large intestine without perforation or abscess without bleeding: Secondary | ICD-10-CM | POA: Diagnosis not present

## 2019-03-24 DIAGNOSIS — Z1211 Encounter for screening for malignant neoplasm of colon: Secondary | ICD-10-CM | POA: Diagnosis not present

## 2019-03-24 DIAGNOSIS — K6289 Other specified diseases of anus and rectum: Secondary | ICD-10-CM | POA: Diagnosis not present

## 2019-03-24 DIAGNOSIS — D127 Benign neoplasm of rectosigmoid junction: Secondary | ICD-10-CM | POA: Diagnosis not present

## 2019-03-24 DIAGNOSIS — D128 Benign neoplasm of rectum: Secondary | ICD-10-CM | POA: Diagnosis not present

## 2019-03-24 DIAGNOSIS — K635 Polyp of colon: Secondary | ICD-10-CM | POA: Diagnosis not present

## 2019-03-24 LAB — HM COLONOSCOPY

## 2019-03-28 ENCOUNTER — Ambulatory Visit (INDEPENDENT_AMBULATORY_CARE_PROVIDER_SITE_OTHER): Payer: BC Managed Care – PPO | Admitting: Specialist

## 2019-03-28 ENCOUNTER — Encounter: Payer: Self-pay | Admitting: Specialist

## 2019-03-28 VITALS — BP 113/76 | HR 91 | Ht 72.0 in | Wt 345.0 lb

## 2019-03-28 DIAGNOSIS — M4712 Other spondylosis with myelopathy, cervical region: Secondary | ICD-10-CM | POA: Diagnosis not present

## 2019-03-28 DIAGNOSIS — M4802 Spinal stenosis, cervical region: Secondary | ICD-10-CM | POA: Diagnosis not present

## 2019-03-28 DIAGNOSIS — M4722 Other spondylosis with radiculopathy, cervical region: Secondary | ICD-10-CM | POA: Diagnosis not present

## 2019-03-28 NOTE — Patient Instructions (Addendum)
Plan:Avoid overhead lifting and overhead use of the arms. Do not lift greater than 5 lbs. Adjust head rest in vehicle to prevent hyperextension if rear ended. Take extra precautions to avoid falling, including use of a cane if you feel weak. Scheduling secretary Kandice Hams. will call you to arrange for surgery for your cervical spine. If you wish a second opinion please let us know and we can arrange for you. If you have worsening arm or leg numbness or weakness please call or go to an ER. We will contact your cardiologist and primary care physicians to seek clearance for your surgery. Surgery will be an anterior cervical discectomy and fusion at the C5-6 and C6-7 levels with decompression of the cervical spinal canal at C6-7 and C5-6 and removal of spur and disc pressing on the spinal cord. An additional decompression fusion is done at the C5-6 and C6-7 level with bone grafting and plate and screws, Local bone graft as well and allograft bone graft and vivigen.  Risks of surgery include risks of infection, bleeding and risks to the spinal cord and  Risks of sore throat and difficulty swallowing which should Improve over the next 4-6 weeks following surgery. Surgery is indicated due to upper extremity radiculopathy, lermittes phenomena and falls. In the future surgery at adjacent levels may be necessary but these levels do not appear to be related to your current symptoms or signs.

## 2019-03-28 NOTE — Progress Notes (Addendum)
Office Visit Note   Patient: Mark Harrell           Date of Birth: Mar 28, 1961           MRN: [id number] Visit Date: 03/28/2019              Requested by: [name], [address] [place],  [place] [id number] PCP: [name], MD   Assessment & Plan: Visit Diagnoses:  1. Spinal stenosis of cervical region   2. Other spondylosis with myelopathy, cervical region   3. Cervical spondylosis with radiculopathy     Plan:Avoid overhead lifting and overhead use of the arms. Do not lift greater than 5 lbs. Adjust head rest in vehicle to prevent hyperextension if rear ended. Take extra precautions to avoid falling, including use of a cane if you feel weak. Scheduling secretary Kandice Hams. will call you to arrange for surgery for your cervical spine. If you wish a second opinion please let us know and we can arrange for you. If you have worsening arm or leg numbness or weakness please call or go to an ER. We will contact your cardiologist and primary care physicians to seek clearance for your surgery. Surgery will be an anterior cervical discectomy and fusion at the C5-6 and C6-7 levels with decompression of the cervical spinal canal at C6-7 and C5-6 and removal of spur and disc pressing on the spinal cord. An additional decompression fusion is done at the C5-6 and C6-7 level with bone grafting and plate and screws, Local bone graft as well and allograft bone graft and vivigen.  Risks of surgery include risks of infection, bleeding and risks to the spinal cord and  Risks of sore throat and difficulty swallowing which should Improve over the next 4-6 weeks following surgery. Surgery is indicated due to upper extremity radiculopathy, lermittes phenomena and falls. In the future surgery at adjacent levels may be necessary but these levels do not appear to be related to your current symptoms or signs.    Follow-Up Instructions: Return in about 4 weeks (around  04/25/2019).   Orders:  No orders of the defined types were placed in this encounter.  No orders of the defined types were placed in this encounter.     Procedures: No procedures performed   Clinical Data: Findings:  CLINICAL DATA:  57 year old male status post fall in 2019. Neck pain.  EXAM: MRI CERVICAL SPINE WITHOUT CONTRAST  TECHNIQUE: Multiplanar, multisequence MR imaging of the cervical spine was performed. No intravenous contrast was administered.  COMPARISON:  Cervical spine MRI 04/17/2018.  FINDINGS: Alignment: Chronic straightening of cervical lordosis. No spondylolisthesis.  Vertebrae: No marrow edema or evidence of acute osseous abnormality. Visualized bone marrow signal is within normal limits.  Cord: No spinal cord signal abnormality identified despite chronic spinal stenosis with mass effect at C5-C6.  Posterior Fossa, vertebral arteries, paraspinal tissues: Cervicomedullary junction is within normal limits. Negative visible posterior fossa. Preserved major vascular flow voids in the neck with dominant appearing left vertebral artery.  Large body habitus. Negative visible neck soft tissues and lung apices.  Disc levels:  C2-C3: Mild endplate spurring and facet hypertrophy affecting the neural foramina. Up to mild C3 foraminal stenosis.  C3-C4: Negative aside from a degree of congenital spinal canal narrowing.  C4-C5: Mild disc bulge and endplate spurring. Mild spinal stenosis. Mild if any cord mass effect. Mild mostly left side C5 foraminal stenosis.  C5-C6: Bulky right paracentral disc osteophyte complex with additional  circumferential disc bulge and endplate spurring. Mild spinal stenosis and mild to moderate right hemi cord mass effect. Mild left but moderate right C6 foraminal stenosis. This level appears stable since 09/08/2017.  C6-C7: Circumferential disc osteophyte complex with broad-based posterior component and mild  ligament flavum hypertrophy. Mild spinal stenosis and spinal cord mass effect. Moderate to severe bilateral C7 foraminal stenosis. This level is stable.  C7-T1: Mild disc bulge and posterior element hypertrophy. No significant stenosis.  Stable visible upper thoracic levels, including disc bulging at T2-T3.  IMPRESSION: 1. No acute osseous abnormality in the cervical spine. 2. Stable cervical spine degeneration since 09/08/17, most pronounced at C5-C6 where bulky right paracentral disc osteophyte complex results in spinal stenosis with mild to moderate mass effect on the right hemi cord. No cord signal abnormality. 3. Lesser congenital and/or acquired cervical spinal stenosis elsewhere. Moderate or severe neural foraminal stenosis at the right C6 and bilateral C7 nerve levels.   Electronically Signed   By: Genevie Ann M.D.   On: 03/07/2019 01:05    Subjective: Chief Complaint  Patient presents with  . Neck - Follow-up    MRI Review  . Lower Back - Follow-up    MRI Review    58 year old male amidextrous though he writes with the right hand. He has been experiencing problems with numbness and tingling in both hands and both feet. He has findings of cervical stenosis and a surgical corpectomy and discectomy was recommended He underwent cardiology evaluation by Dr. Quay Burow and apparently he has been cleared for surgery. Standing and walking he is off balance and has a tendency to fall. He has upper extremity weakness bilat. He has nocturnal enuresis. Issues with cystitis and ED>     Review of Systems  Constitutional: Negative for unexpected weight change.  HENT: Negative.   Eyes: Negative.  Negative for photophobia, pain, discharge, redness, itching and visual disturbance.  Respiratory: Negative.  Negative for apnea, cough, choking, chest tightness, shortness of breath, wheezing (Asthma and COPD) and stridor.   Cardiovascular: Negative for chest pain, palpitations and leg  swelling.  Gastrointestinal: Negative for abdominal distention, abdominal pain, anal bleeding, blood in stool, constipation, diarrhea, nausea, rectal pain and vomiting.  Endocrine: Negative for cold intolerance, heat intolerance, polydipsia, polyphagia and polyuria.  Genitourinary: Positive for enuresis and urgency. Negative for decreased urine volume, difficulty urinating, dysuria and hematuria.  Musculoskeletal: Positive for back pain and gait problem. Negative for neck pain and neck stiffness.  Skin: Negative for color change, pallor, rash and wound.  Allergic/Immunologic: Negative for environmental allergies, food allergies and immunocompromised state.  Neurological: Negative for dizziness, tremors, seizures, syncope, facial asymmetry, speech difficulty, weakness, light-headedness, numbness and headaches.  Hematological: Negative for adenopathy. Does not bruise/bleed easily.  Psychiatric/Behavioral: Negative for agitation, behavioral problems, confusion, decreased concentration, dysphoric mood, hallucinations, self-injury, sleep disturbance and suicidal ideas. The patient is not nervous/anxious and is not hyperactive.      Objective: Vital Signs: BP 113/76 (BP Location: Left Arm, Patient Position: Sitting)   Pulse 91   Ht 6' (1.829 m)   Wt (!) 345 lb (156.5 kg)   BMI 46.79 kg/m   Physical Exam Constitutional:      Appearance: He is well-developed.  HENT:     Head: Normocephalic and atraumatic.  Eyes:     Pupils: Pupils are equal, round, and reactive to light.  Neck:     Musculoskeletal: Normal range of motion and neck supple.  Pulmonary:  Effort: Pulmonary effort is normal.     Breath sounds: Normal breath sounds.  Abdominal:     General: Bowel sounds are normal.     Palpations: Abdomen is soft.  Skin:    General: Skin is warm and dry.  Neurological:     Mental Status: He is alert and oriented to person, place, and time.  Psychiatric:        Behavior: Behavior normal.         Thought Content: Thought content normal.        Judgment: Judgment normal.     Back Exam   Tenderness  The patient is experiencing tenderness in the cervical.  Range of Motion  Extension: abnormal  Flexion: abnormal  Lateral bend right: abnormal  Lateral bend left: abnormal  Rotation right: abnormal  Rotation left: abnormal   Muscle Strength  Right Quadriceps:  5/5  Left Quadriceps:  5/5  Left Hamstrings:  5/5   Reflexes  Patellar: 3/4 Achilles: 3/4 Biceps: 0/4 Babinski's sign: normal   Other  Toe walk: abnormal Heel walk: abnormal Sensation: normal  Comments:  Weak bilateral biceps 4/5, weak bilateral wrist VF 4/5 and finger flexion left greater right 4/5 right 5-/5      Specialty Comments:  No specialty comments available.  Imaging: No results found.   PMFS History: Patient Active Problem List   Diagnosis Date Noted  . Chronic respiratory failure with hypoxia ([place]) 04/12/2018  . Bilateral lower extremity edema 12/29/2017  . Atypical chest pain 12/29/2017  . Hymenoptera allergy 11/10/2017  . Diverticulosis 08/07/2017  . Family history of colonic polyps 08/07/2017  . Flatus 08/07/2017  . Myofascial pain 08/06/2017  . Seasonal and perennial allergic rhinitis 04/23/2017  . 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 Cervicalgia 04/01/2017  . Cervical spondylosis with radiculopathy 02/09/2017  . Memory difficulty 12/05/2016  . Morbid obesity ([place]) 12/05/2016  . Diarrhea in adult patient 12/03/2016  . Internal hemorrhoids 12/03/2016  . Headache 11/13/2016  . Migraine without aura and without status migrainosus, not intractable 10/30/2016  . Irritant contact dermatitis due to frequent handwashing 06/08/2016  . Lumbar radiculopathy 05/15/2016  . Osteoarthritis of spine with radiculopathy, lumbar region 05/15/2016  . Risk for falls 05/15/2016  . Acute medial meniscal tear, left, subsequent encounter 04/23/2016  . Recurrent infections 03/29/2016  .  Chronic nonseasonal allergic rhinitis due to pollen 03/29/2016  . Polypharmacy 01/16/2016  . Morbid obesity with BMI of 50.0-59.9, adult ([place]) 03/06/2015  . SDAT 02/05/2015  . Asthma 02/05/2015  . OSA and COPD overlap syndrome ([place]) 02/05/2015  . Medication management 08/02/2014  . GERD (gastroesophageal reflux disease) 05/09/2014  . Vitamin D deficiency 08/01/2013  . Prediabetes 08/01/2013  . 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 Diverticula of colon 05/07/2011  . Hypertension 01/31/2011  . Hyperlipidemia, mixed 01/31/2011  . BPH (benign prostatic hyperplasia) 01/31/2011  . Testosterone Deficiency 01/31/2011  . IBS (irritable bowel syndrome) 01/31/2011  . Partial complex seizure disorder with intractable epilepsy ([place]) 01/31/2011  . Depression, major, recurrent, in partial remission ([place]) 01/31/2011  . Asthma-COPD overlap syndrome ([place]) 01/31/2011   Past Medical History:  Diagnosis Date  . Anesthesia complication requiring reversal agent administration    ? from central apnea, very difficult to get off vent  . Anxiety   . Arthritis    osteo  . Asthma   . BPH (benign prostatic hyperplasia)   . Complication of anesthesia    difficulty waking , they twlight me because of my respiratory problems "  .  Dyspnea   . Enlarged heart   . Family history of adverse reaction to anesthesia    mother trouble waking up, and heart stopped  . GERD (gastroesophageal reflux disease)   . Headache    botox injections for headaches  . Hyperlipidemia   . Hypertension   . Hypogonadism male   . IBS (irritable bowel syndrome)   . Obesity   . OSA (obstructive sleep apnea)    cpap  . Pneumonia   . Pre-diabetes   . Prostatitis     Family History  Problem Relation Age of Onset  . Diabetes Paternal Uncle   . Cancer Father        lymphoma, colon  . Diabetes Maternal Grandmother   . Heart disease Maternal Grandfather   . Diabetes Maternal Grandfather   . Diabetes Paternal Grandmother   . Diabetes  Paternal Grandfather   . Dementia Mother   . Prostate cancer Maternal Uncle   . Lung disease Neg Hx   . Rheumatologic disease Neg Hx     Past Surgical History:  Procedure Laterality Date  . ABDOMINAL SURGERY    . ANKLE FRACTURE SURGERY Right   . CYSTOSCOPY     [place]  . KNEE ARTHROSCOPY WITH MEDIAL MENISECTOMY Left 01/02/2017   Procedure: LEFT KNEE ARTHROSCOPY WITH PARTIAL MEDIAL MENISCECTOMY;  Surgeon: Mcarthur Rossetti, MD;  Location: [facility];  Service: Orthopedics;  Laterality: Left;  . TONSILLECTOMY    . [place] RESECTION  2007  . UVULOPALATOPHARYNGOPLASTY     Social History   Occupational History  . Occupation: Dance movement psychotherapist  Tobacco Use  . Smoking status: Former Smoker    Packs/day: 0.10    Years: 15.00    Pack years: 1.50  . Smokeless tobacco: Never Used  . Tobacco comment: significant second-hand exposure through mother  Substance and Sexual Activity  . Alcohol use: Yes    Alcohol/week: 1.0 standard drinks    Types: 1 Glasses of wine per week    Comment: 2 x a year  . Drug use: No  . Sexual activity: Never

## 2019-04-02 ENCOUNTER — Other Ambulatory Visit: Payer: Self-pay | Admitting: Internal Medicine

## 2019-04-02 DIAGNOSIS — E876 Hypokalemia: Secondary | ICD-10-CM

## 2019-04-05 ENCOUNTER — Ambulatory Visit: Payer: Self-pay | Admitting: Adult Health

## 2019-04-05 ENCOUNTER — Ambulatory Visit (INDEPENDENT_AMBULATORY_CARE_PROVIDER_SITE_OTHER): Payer: BC Managed Care – PPO | Admitting: Adult Health Nurse Practitioner

## 2019-04-05 ENCOUNTER — Encounter: Payer: Self-pay | Admitting: Adult Health Nurse Practitioner

## 2019-04-05 ENCOUNTER — Other Ambulatory Visit: Payer: Self-pay

## 2019-04-05 VITALS — BP 122/70 | HR 85 | Temp 96.6°F | Ht 72.0 in | Wt 343.8 lb

## 2019-04-05 DIAGNOSIS — E119 Type 2 diabetes mellitus without complications: Secondary | ICD-10-CM

## 2019-04-05 DIAGNOSIS — K589 Irritable bowel syndrome without diarrhea: Secondary | ICD-10-CM

## 2019-04-05 DIAGNOSIS — F0150 Vascular dementia without behavioral disturbance: Secondary | ICD-10-CM

## 2019-04-05 DIAGNOSIS — E782 Mixed hyperlipidemia: Secondary | ICD-10-CM

## 2019-04-05 DIAGNOSIS — I10 Essential (primary) hypertension: Secondary | ICD-10-CM | POA: Diagnosis not present

## 2019-04-05 DIAGNOSIS — J3089 Other allergic rhinitis: Secondary | ICD-10-CM | POA: Diagnosis not present

## 2019-04-05 DIAGNOSIS — N138 Other obstructive and reflux uropathy: Secondary | ICD-10-CM

## 2019-04-05 DIAGNOSIS — J449 Chronic obstructive pulmonary disease, unspecified: Secondary | ICD-10-CM

## 2019-04-05 DIAGNOSIS — Z6841 Body Mass Index (BMI) 40.0 and over, adult: Secondary | ICD-10-CM

## 2019-04-05 DIAGNOSIS — M5416 Radiculopathy, lumbar region: Secondary | ICD-10-CM

## 2019-04-05 DIAGNOSIS — E538 Deficiency of other specified B group vitamins: Secondary | ICD-10-CM

## 2019-04-05 DIAGNOSIS — Z79899 Other long term (current) drug therapy: Secondary | ICD-10-CM

## 2019-04-05 DIAGNOSIS — E559 Vitamin D deficiency, unspecified: Secondary | ICD-10-CM

## 2019-04-05 DIAGNOSIS — K5730 Diverticulosis of large intestine without perforation or abscess without bleeding: Secondary | ICD-10-CM

## 2019-04-05 DIAGNOSIS — F3341 Major depressive disorder, recurrent, in partial remission: Secondary | ICD-10-CM

## 2019-04-05 DIAGNOSIS — K219 Gastro-esophageal reflux disease without esophagitis: Secondary | ICD-10-CM

## 2019-04-05 DIAGNOSIS — R7303 Prediabetes: Secondary | ICD-10-CM

## 2019-04-05 DIAGNOSIS — G4733 Obstructive sleep apnea (adult) (pediatric): Secondary | ICD-10-CM | POA: Diagnosis not present

## 2019-04-05 DIAGNOSIS — N401 Enlarged prostate with lower urinary tract symptoms: Secondary | ICD-10-CM

## 2019-04-05 DIAGNOSIS — J4489 Other specified chronic obstructive pulmonary disease: Secondary | ICD-10-CM

## 2019-04-05 DIAGNOSIS — M542 Cervicalgia: Secondary | ICD-10-CM

## 2019-04-05 DIAGNOSIS — G43009 Migraine without aura, not intractable, without status migrainosus: Secondary | ICD-10-CM

## 2019-04-05 NOTE — Patient Instructions (Signed)
We will respond via MyChart with your lab results  We will follow up with you after your surgery.       Vit D  & Vit C 1,000 mg   are recommended to help protect  against the Covid-19 and other Corona viruses.    Also it's recommended  to take  Zinc 50 mg  to help  protect against the Covid-19   and best place to get  is also on [email or web address]  and don't pay more than 6-8 cents /pill !  =============================== Coronavirus (COVID-19) Are you at risk?  Are you at risk for the Coronavirus (COVID-19)?  To be considered HIGH RISK for Coronavirus (COVID-19), you have to meet the following criteria:  . Traveled to [place], [place], [place], [place] or [place]; or in the [place] to [place], [place], [place]  . or [place]; and have fever, cough, and shortness of breath within the last 2 weeks of travel OR . Been in close contact with a person diagnosed with COVID-19 within the last 2 weeks and have  . fever, cough,and shortness of breath .  . IF YOU DO NOT MEET THESE CRITERIA, YOU ARE CONSIDERED LOW RISK FOR COVID-19.  What to do if you are HIGH RISK for COVID-19?  [name] If you are having a medical emergency, call 911. . Seek medical care right away. Before you go to a doctor's office, urgent care or emergency department, .  call ahead and tell them about your recent travel, contact with someone diagnosed with COVID-19  .  and your symptoms.  . You should receive instructions from your physician's office regarding next steps of care.  . When you arrive at healthcare provider, tell the healthcare staff immediately you have returned from  . visiting [place], [place], [place], [place] or [place]; or traveled in the [place] to [place], [place],  . [place] or [place] in the last two weeks or you have been in close contact with a person diagnosed with  . COVID-19 in the last 2 weeks.   . Tell the health care staff about your symptoms: fever, cough and shortness  of breath. . After you have been seen by a medical provider, you will be either: o Tested for (COVID-19) and discharged home on quarantine except to seek medical care if  o symptoms worsen, and asked to  - Stay home and avoid contact with others until you get your results (4-5 days)  - Avoid travel on public transportation if possible (such as bus, train, or airplane) or o Sent to the Emergency Department by EMS for evaluation, COVID-19 testing  and  o possible admission depending on your condition and test results.  What to do if you are LOW RISK for COVID-19?  Reduce your risk of any infection by using the same precautions used for avoiding the common cold or flu:  [name] Wash your hands often with soap and warm water for at least 20 seconds.  If soap and water are not readily available,  . use an alcohol-based hand sanitizer with at least 60% alcohol.  . If coughing or sneezing, cover your mouth and nose by coughing or sneezing into the elbow areas of your shirt or coat, .  into a tissue or into your sleeve (not your hands). . Avoid shaking hands with others and consider head nods or verbal greetings only. . Avoid touching your eyes, nose, or mouth with unwashed hands.  . Avoid close  contact with people who are sick. . Avoid places or events with large numbers of people in one location, like concerts or sporting events. . Carefully consider travel plans you have or are making. . If you are planning any travel outside or inside the [place], visit the CDC's Travelers' Health webpage for the latest health notices. . If you have some symptoms but not all symptoms, continue to monitor at home and seek medical attention  . if your symptoms worsen. . If you are having a medical emergency, call 911. >>>>>>>>>>>>>>>>>>>>>>>>>>>> Preventive Care for Adults  A healthy lifestyle and preventive care can promote health and wellness. Preventive health guidelines for men include the following key  practices:  A routine yearly physical is a good way to check with your health care provider about your health and preventative screening. It is a chance to share any concerns and updates on your health and to receive a thorough exam.  Visit your dentist for a routine exam and preventative care every 6 months. Brush your teeth twice a day and floss once a day. Good oral hygiene prevents tooth decay and gum disease.  The frequency of eye exams is based on your age, health, family medical history, use of contact lenses, and other factors. Follow your health care provider's recommendations for frequency of eye exams.  Eat a healthy diet. Foods such as vegetables, fruits, whole grains, low-fat dairy products, and lean protein foods contain the nutrients you need without too many calories. Decrease your intake of foods high in solid fats, added sugars, and salt. Eat the right amount of calories for you. Get information about a proper diet from your health care provider, if necessary.  Regular physical exercise is one of the most important things you can do for your health. Most adults should get at least 150 minutes of moderate-intensity exercise (any activity that increases your heart rate and causes you to sweat) each week. In addition, most adults need muscle-strengthening exercises on 2 or more days a week.  Maintain a healthy weight. The body mass index (BMI) is a screening tool to identify possible weight problems. It provides an estimate of body fat based on height and weight. Your health care provider can find your BMI and can help you achieve or maintain a healthy weight. For adults 20 years and older:  A BMI below 18.5 is considered underweight.  A BMI of 18.5 to 24.9 is normal.  A BMI of 25 to 29.9 is considered overweight.  A BMI of 30 and above is considered obese.  Maintain normal blood lipids and cholesterol levels by exercising and minimizing your intake of saturated fat. Eat a  balanced diet with plenty of fruit and vegetables. Blood tests for lipids and cholesterol should begin at age 74 and be repeated every 5 years. If your lipid or cholesterol levels are high, you are over 50, or you are at high risk for heart disease, you may need your cholesterol levels checked more frequently. Ongoing high lipid and cholesterol levels should be treated with medicines if diet and exercise are not working.  If you smoke, find out from your health care provider how to quit. If you do not use tobacco, do not start.  Lung cancer screening is recommended for adults aged 60-80 years who are at high risk for developing lung cancer because of a history of smoking. A yearly low-dose CT scan of the lungs is recommended for people who have at least a 30-pack-year history of  smoking and are a current smoker or have quit within the past 15 years. A pack year of smoking is smoking an average of 1 pack of cigarettes a day for 1 year (for example: 1 pack a day for 30 years or 2 packs a day for 15 years). Yearly screening should continue until the smoker has stopped smoking for at least 15 years. Yearly screening should be stopped for people who develop a health problem that would prevent them from having lung cancer treatment.  If you choose to drink alcohol, do not have more than 2 drinks per day. One drink is considered to be 12 ounces (355 mL) of beer, 5 ounces (148 mL) of wine, or 1.5 ounces (44 mL) of liquor.  Avoid use of street drugs. Do not share needles with anyone. Ask for help if you need support or instructions about stopping the use of drugs.  High blood pressure causes heart disease and increases the risk of stroke. Your blood pressure should be checked at least every 1-2 years. Ongoing high blood pressure should be treated with medicines, if weight loss and exercise are not effective.  If you are 39-57 years old, ask your health care provider if you should take aspirin to prevent heart  disease.  Diabetes screening involves taking a blood sample to check your fasting blood sugar level. This should be done once every 3 years, after age 76, if you are within normal weight and without risk factors for diabetes. Testing should be considered at a younger age or be carried out more frequently if you are overweight and have at least 1 risk factor for diabetes.  Colorectal cancer can be detected and often prevented. Most routine colorectal cancer screening begins at the age of 22 and continues through age 68. However, your health care provider may recommend screening at an earlier age if you have risk factors for colon cancer. On a yearly basis, your health care provider may provide home test kits to check for hidden blood in the stool. Use of a small camera at the end of a tube to directly examine the colon (sigmoidoscopy or colonoscopy) can detect the earliest forms of colorectal cancer. Talk to your health care provider about this at age 60, when routine screening begins. Direct exam of the colon should be repeated every 5-10 years through age 30, unless early forms of precancerous polyps or small growths are found.   Talk with your health care provider about prostate cancer screening.  Testicular cancer screening isrecommended for adult males. Screening includes self-exam, a health care provider exam, and other screening tests. Consult with your health care provider about any symptoms you have or any concerns you have about testicular cancer.  Use sunscreen. Apply sunscreen liberally and repeatedly throughout the day. You should seek shade when your shadow is shorter than you. Protect yourself by wearing long sleeves, pants, a wide-brimmed hat, and sunglasses year round, whenever you are outdoors.  Once a month, do a whole-body skin exam, using a mirror to look at the skin on your back. Tell your health care provider about new moles, moles that have irregular borders, moles that are larger  than a pencil eraser, or moles that have changed in shape or color.  Stay current with required vaccines (immunizations).  Influenza vaccine. All adults should be immunized every year.  Tetanus, diphtheria, and acellular pertussis (Td, Tdap) vaccine. An adult who has not previously received Tdap or who does not know his vaccine status should  receive 1 dose of Tdap. This initial dose should be followed by tetanus and diphtheria toxoids (Td) booster doses every 10 years. Adults with an unknown or incomplete history of completing a 3-dose immunization series with Td-containing vaccines should begin or complete a primary immunization series including a Tdap dose. Adults should receive a Td booster every 10 years.  Varicella vaccine. An adult without evidence of immunity to varicella should receive 2 doses or a second dose if he has previously received 1 dose.  Human papillomavirus (HPV) vaccine. Males aged 43-21 years who have not received the vaccine previously should receive the 3-dose series. Males aged 22-26 years may be immunized. Immunization is recommended through the age of 41 years for any male who has sex with males and did not get any or all doses earlier. Immunization is recommended for any person with an immunocompromised condition through the age of 8 years if he did not get any or all doses earlier. During the 3-dose series, the second dose should be obtained 4-8 weeks after the first dose. The third dose should be obtained 24 weeks after the first dose and 16 weeks after the second dose.  Zoster vaccine. One dose is recommended for adults aged 58 years or older unless certain conditions are present.    PREVNAR  - Pneumococcal 13-valent conjugate (PCV13) vaccine. When indicated, a person who is uncertain of his immunization history and has no record of immunization should receive the PCV13 vaccine. An adult aged 12 years or older who has certain medical conditions and has not been  previously immunized should receive 1 dose of PCV13 vaccine. This PCV13 should be followed with a dose of pneumococcal polysaccharide (PPSV23) vaccine. The PPSV23 vaccine dose should be obtained at least 1 r more year(s) after the dose of PCV13 vaccine. An adult aged 95 years or older who has certain medical conditions and previously received 1 or more doses of PPSV23 vaccine should receive 1 dose of PCV13. The PCV13 vaccine dose should be obtained 1 or more years after the last PPSV23 vaccine dose.    PNEUMOVAX - Pneumococcal polysaccharide (PPSV23) vaccine. When PCV13 is also indicated, PCV13 should be obtained first. All adults aged 31 years and older should be immunized. An adult younger than age 45 years who has certain medical conditions should be immunized. Any person who resides in a nursing home or long-term care facility should be immunized. An adult smoker should be immunized. People with an immunocompromised condition and certain other conditions should receive both PCV13 and PPSV23 vaccines. People with human immunodeficiency virus (HIV) infection should be immunized as soon as possible after diagnosis. Immunization during chemotherapy or radiation therapy should be avoided. Routine use of PPSV23 vaccine is not recommended for American Indians, [place]atives, or people younger than 65 years unless there are medical conditions that require PPSV23 vaccine. When indicated, people who have unknown immunization and have no record of immunization should receive PPSV23 vaccine. One-time revaccination 5 years after the first dose of PPSV23 is recommended for people aged 19-64 years who have chronic kidney failure, nephrotic syndrome, asplenia, or immunocompromised conditions. People who received 1-2 doses of PPSV23 before age 70 years should receive another dose of PPSV23 vaccine at age 96 years or later if at least 5 years have passed since the previous dose. Doses of PPSV23 are not needed for people  immunized with PPSV23 at or after age 24 years.    Hepatitis A vaccine. Adults who wish to be protected from  this disease, have certain high-risk conditions, work with hepatitis A-infected animals, work in hepatitis A research labs, or travel to or work in countries with a high rate of hepatitis A should be immunized. Adults who were previously unvaccinated and who anticipate close contact with an international adoptee during the first 60 days after arrival in the [place] from a country with a high rate of hepatitis A should be immunized.    Hepatitis B vaccine. Adults should be immunized if they wish to be protected from this disease, have certain high-risk conditions, may be exposed to blood or other infectious body fluids, are household contacts or sex partners of hepatitis B positive people, are clients or workers in certain care facilities, or travel to or work in countries with a high rate of hepatitis B.   Preventive Service / Frequency   Ages 91 to 66  Blood pressure check.  Lipid and cholesterol check  Lung cancer screening. / Every year if you are aged 84-80 years and have a 30-pack-year history of smoking and currently smoke or have quit within the past 15 years. Yearly screening is stopped once you have quit smoking for at least 15 years or develop a health problem that would prevent you from having lung cancer treatment.  Fecal occult blood test (FOBT) of stool. / Every year beginning at age 56 and continuing until age 14. You may not have to do this test if you get a colonoscopy every 10 years.  Flexible sigmoidoscopy** or colonoscopy.** / Every 5 years for a flexible sigmoidoscopy or every 10 years for a colonoscopy beginning at age 22 and continuing until age 4. Screening for abdominal aortic aneurysm (AAA)  by ultrasound is recommended for people who have history of high blood pressure or who are current or former smokers. +++++++++++ Recommend Adult Low Dose  Aspirin or  coated  Aspirin 81 mg daily  To reduce risk of Colon Cancer 40 %,  Skin Cancer 26 % ,  Malignant Melanoma 46%  and  Pancreatic cancer 60% ++++++++++++++++++++ Vitamin D goal  is between 70-100.  Please make sure that you are taking your Vitamin D as directed.  It is very important as a natural anti-inflammatory  helping hair, skin, and nails, as well as reducing stroke and heart attack risk.  It helps your bones and helps with mood. It also decreases numerous cancer risks so please take it as directed.  Low Vit D is associated with a 200-300% higher risk for CANCER  and 200-300% higher risk for HEART   ATTACK  &  STROKE.   .....................................[name] It is also associated with higher death rate at younger ages,  autoimmune diseases like Rheumatoid arthritis, Lupus, Multiple Sclerosis.    Also many other serious conditions, like depression, Alzheimer's Dementia, infertility, muscle aches, fatigue, fibromyalgia - just to name a few. +++++++++++++++++++++ Recommend the book "The END of DIETING" by Dr Excell Seltzer  & the book "The END of DIABETES " by Dr Excell Seltzer At [email or web address] - get book & Audio CD's    Being diabetic has a  300% increased risk for heart attack, stroke, cancer, and alzheimer- type vascular dementia. It is very important that you work harder with diet by avoiding all foods that are white. Avoid white rice (brown & wild rice is OK), white potatoes (sweetpotatoes in moderation is OK), White bread or wheat bread or anything made out of white flour like bagels, donuts, rolls, buns, biscuits, cakes, pastries, cookies, pizza crust, and  pasta (made from white flour & egg whites) - vegetarian pasta or spinach or wheat pasta is OK. Multigrain breads like Arnold's or [employer], or multigrain sandwich thins or flatbreads.  Diet, exercise and weight loss can reverse and cure diabetes in the early stages.  Diet, exercise and weight loss is very important in  the control and prevention of complications of diabetes which affects every system in your body, ie. Brain - dementia/stroke, eyes - glaucoma/blindness, heart - heart attack/heart failure, kidneys - dialysis, stomach - gastric paralysis, intestines - malabsorption, nerves - severe painful neuritis, circulation - gangrene & loss of a leg(s), and finally cancer and Alzheimers.    I recommend avoid fried & greasy foods,  sweets/candy, white rice (brown or wild rice or Quinoa is OK), white potatoes (sweet potatoes are OK) - anything made from white flour - bagels, doughnuts, rolls, buns, biscuits,white and wheat breads, pizza crust and traditional pasta made of white flour & egg white(vegetarian pasta or spinach or wheat pasta is OK).  Multi-grain bread is OK - like multi-grain flat bread or sandwich thins. Avoid alcohol in excess. Exercise is also important.    Eat all the vegetables you want - avoid meat, especially red meat and dairy - especially cheese.  Cheese is the most concentrated form of trans-fats which is the worst thing to clog up our arteries. Veggie cheese is OK which can be found in the fresh produce section at [employer] or [employer] or [employer]  ++++++++++++++++++++++ DASH Eating Plan  DASH stands for "Dietary Approaches to Stop Hypertension."   The DASH eating plan is a healthy eating plan that has been shown to reduce high blood pressure (hypertension). Additional health benefits may include reducing the risk of type 2 diabetes mellitus, heart disease, and stroke. The DASH eating plan may also help with weight loss. WHAT DO I NEED TO KNOW ABOUT THE DASH EATING PLAN? For the DASH eating plan, you will follow these general guidelines:  Choose foods with a percent daily value for sodium of less than 5% (as listed on the food label).  Use salt-free seasonings or herbs instead of table salt or sea salt.  Check with your health care provider or pharmacist before using salt  substitutes.  Eat lower-sodium products, often labeled as "lower sodium" or "no salt added."  Eat fresh foods.  Eat more vegetables, fruits, and low-fat dairy products.  Choose whole grains. Look for the word "whole" as the first word in the ingredient list.  Choose fish   Limit sweets, desserts, sugars, and sugary drinks.  Choose heart-healthy fats.  Eat veggie cheese   Eat more home-cooked food and less restaurant, buffet, and fast food.  Limit fried foods.  Cook foods using methods other than frying.  Limit canned vegetables. If you do use them, rinse them well to decrease the sodium.  When eating at a restaurant, ask that your food be prepared with less salt, or no salt if possible.                      WHAT FOODS CAN I EAT? Read Dr Fara Olden Fuhrman's books on The End of Dieting & The End of Diabetes  Grains Whole grain or whole wheat bread. Brown rice. Whole grain or whole wheat pasta. Quinoa, bulgur, and whole grain cereals. Low-sodium cereals. Corn or whole wheat flour tortillas. Whole grain cornbread. Whole grain crackers. Low-sodium crackers.  Vegetables Fresh or frozen vegetables (raw, steamed, roasted, or grilled).  Low-sodium or reduced-sodium tomato and vegetable juices. Low-sodium or reduced-sodium tomato sauce and paste. Low-sodium or reduced-sodium canned vegetables.   Fruits All fresh, canned (in natural juice), or frozen fruits.  Protein Products  All fish and seafood.  Dried beans, peas, or lentils. Unsalted nuts and seeds. Unsalted canned beans.  Dairy Low-fat dairy products, such as skim or 1% milk, 2% or reduced-fat cheeses, low-fat ricotta or cottage cheese, or plain low-fat yogurt. Low-sodium or reduced-sodium cheeses.  Fats and Oils Tub margarines without trans fats. Light or reduced-fat mayonnaise and salad dressings (reduced sodium). Avocado. Safflower, olive, or canola oils. Natural peanut or almond butter.  Other Unsalted popcorn and  pretzels. The items listed above may not be a complete list of recommended foods or beverages. Contact your dietitian for more options.  +++++++++++++++++++  WHAT FOODS ARE NOT RECOMMENDED? Grains/ White flour or wheat flour White bread. White pasta. White rice. Refined cornbread. Bagels and croissants. Crackers that contain trans fat.  Vegetables  Creamed or fried vegetables. Vegetables in a . Regular canned vegetables. Regular canned tomato sauce and paste. Regular tomato and vegetable juices.  Fruits Dried fruits. Canned fruit in light or heavy syrup. Fruit juice.  Meat and Other Protein Products Meat in general - RED meat & White meat.  Fatty cuts of meat. Ribs, chicken wings, all processed meats as bacon, sausage, bologna, salami, fatback, hot dogs, bratwurst and packaged luncheon meats.  Dairy Whole or 2% milk, cream, half-and-half, and cream cheese. Whole-fat or sweetened yogurt. Full-fat cheeses or blue cheese. Non-dairy creamers and whipped toppings. Processed cheese, cheese spreads, or cheese curds.  Condiments Onion and garlic salt, seasoned salt, table salt, and sea salt. Canned and packaged gravies. Worcestershire sauce. Tartar sauce. Barbecue sauce. Teriyaki sauce. Soy sauce, including reduced sodium. Steak sauce. Fish sauce. Oyster sauce. Cocktail sauce. Horseradish. Ketchup and mustard. Meat flavorings and tenderizers. Bouillon cubes. Hot sauce. Tabasco sauce. Marinades. Taco seasonings. Relishes.  Fats and Oils Butter, stick margarine, lard, shortening and bacon fat. Coconut, palm kernel, or palm oils. Regular salad dressings.  Pickles and olives. Salted popcorn and pretzels.  The items listed above may not be a complete list of foods and beverages to avoid.

## 2019-04-05 NOTE — Progress Notes (Signed)
3 Month Follow Up   Assessment and Plan:  Mark Harrell was seen today for follow-up.  Diagnoses and all orders for this visit:  Essential hypertension Continue current medications: Monitor blood pressure at home; call if consistently over 130/80 Continue DASH diet.   Reminder to go to the ER if any CP, SOB, nausea, dizziness, severe HA, changes vision/speech, left arm numbness and tingling and jaw pain. -     CBC with Diff -     COMPLETE METABOLIC PANEL WITH GFR -     TSH -     Magnesium  Gastroesophageal reflux disease without esophagitis Doing well at this time Continue:  Diet discussed Monitor for triggers Avoid food with high acid content Avoid excessive cafeine Increase water intake  OSA and COPD overlap syndrome (HCC) Does not use CPAP, has caused night terrors  Chronic nonseasonal allergic rhinitis due to pollen Doing well at this time Follows with Asthma-Allergist  Asthma-COPD overlap syndrome ([place]) Continue medications Follow with Pulmonology Continue to monitor  Irritable bowel syndrome, unspecified type Doing well at this time Follow with GI  Lumbar radiculopathy Follows with pain management for this  Cervicalgia Following with Sophronia Simas Dr [name] Planning for surgery  Benign prostatic hyperplasia with lower urinary tract symptoms, symptom details unspecified Taking alfuzosin 10mg  Nocturia x2, no change  Diverticula of colon Doing well at this time Continue to monitor Follows iwth GI  Migraine without aura and without status migrainosus, not intractable Botox injections, follows with Neurology  Vascular dementia without behavioral disturbance ([place]) Follows with nuerology  Depression, major, recurrent, in partial remission ([place]) Continue medications:  Discussed stress management techniques  Discussed, increase water,intake & good sleep hygiene  Discussed increasing exercise & vegetables in diet  Morbid obesity with BMI of 45.0-49.9, adult  (HCC) Discussed dietary and exercise modifications  Medication management Continued  Hyperlipidemia, mixed Continue medications: Discussed dietary and exercise modifications Low fat diet -     Lipid Profile  DMII w/o complications w/o long term use of insulin Continue medications: Discussed general issues about diabetes pathophysiology and management. Education: Reviewed 'ABCs' of diabetes management (respective goals in parentheses):  [name], blood pressure (<130/80), and cholesterol ([name]) Dietary recommendations Encouraged aerobic exercise.  Discussed foot care, check daily Yearly retinal exam Dental exam every 6 months Monitor blood glucose, discussed goal for patient -     Insulin, random -     Hemoglobin A1c ([facility])  Vitamin D deficiency Continue supplementation Taking Vitamin D * IU daily -     Vitamin D (25 hydroxy) B12 Deficiency Continue supplementation  Polypharmacy Discussed medications with patient at length, conerned patient is aware of medications and what they are for. Review with patient Q51months   Continue diet and meds as discussed. Further disposition pending results of labs. Discussed med's effects and SE's.  Patient agrees with plan of care and opportunity to ask questions/voice concerns. Over 40 minutes of chart review, interview, exam, counseling, and critical decision making was performed.   Future Appointments  Date Time Provider [place]  04/28/2019  2:15 PM Jessy Oto, MD [name]  05/24/2019  1:45 PM Tyson Dense T, [place] [name]  07/07/2019  3:30 PM [name], MD [name]  01/10/2020  3:00 PM [name], MD [name]    ----------------------------------------------------------------------------------------------------------------------  HPI 58 y.o. male  presents for 3 month follow up on HTN, HLD, Allergic Asthma, restrictive lung disease, testosterone deficiency, morbid obesity  BMI 46, DMII.  He follows with GI,  Dr [name], Nephrology Dr [name], Neurology for migraine management with Botox injections, Pulmonology Dr [name], Cardiology Dr [name] once a year.  He is following with Dr [name] orthopedics for cervicalgia  He has history of OSA but reports mask intolerance.    Spinal stenosis, cervical, left numbness tingling  Follow with Dr [name], Orthopedic, for this.  This started after halloween 2019 after a fall.  He reports he was to have surgery but postponed related to [place].  They have restarted this clearance.   BMI is Body mass index is 46.63 kg/m., he has not been working on diet and exercise. Wt Readings from Last 3 Encounters:  04/05/19 (!) 343 lb 12.8 oz (155.9 kg)  03/28/19 (!) 345 lb (156.5 kg)  02/17/19 (!) 350 lb (158.8 kg)      His blood pressure has been controlled at home, today their BP is BP: 122/70  He does not workout. He denies any cardiac symptoms, chest pains, palpitations, shortness of breath, dizziness or lower extremity edema.     He is on cholesterol medication Pravastatin and denies myalgias.   His cholesterol is not at goal. The cholesterol last visit was:   Lab Results  Component Value Date   CHOL 134 12/29/2018   HDL 32 (L) 12/29/2018   LDLCALC 69 12/29/2018   TRIG 249 (H) 12/29/2018   CHOLHDL 4.2 12/29/2018    He has not been working on diet and exercise for prediabetes, and denies nausea, polydipsia, polyuria, visual disturbances, vomiting and weight loss. Last A1C in the office was:  Lab Results  Component Value Date   HGBA1C 5.3 12/29/2018   Patient is on Vitamin D supplement.   Lab Results  Component Value Date   VD25OH 128 (H) 12/29/2018     BPH Currently he is symptomatic and endorses nocturia x 2 a night and denies urinary hesitancy, urinary frequency, incomplete voiding, double voiding, weak stream, perineal discomfort, hematuria, abdominal pain, flank pain and testicular pain.  He is taking Alfuzosin 10mg . He  is not following with urology at this time and last results were in normal. Lab Results  Component Value Date   PSA 0.3 12/29/2018      Current Medications:  Current Outpatient Medications on File Prior to Visit  Medication Sig  . albuterol (PROVENTIL HFA;VENTOLIN HFA) 108 (90 Base) MCG/ACT inhaler 1 to 2 inhalations 10 to 15 minutes apart every 4 hrs to rescue Asthma  . alfuzosin (UROXATRAL) 10 MG 24 hr tablet Take 10 mg by mouth daily.    [name] anastrozole (ARIMIDEX) 1 MG tablet daily.  [name] azelastine (ASTELIN) 0.1 % nasal spray Use 1 to 2 sprays  Each Nostril 2 x /day  . baclofen (LIORESAL) 10 MG tablet Take 10 mg by mouth 3 (three) times daily as needed for muscle spasms.   . bisoprolol-hydrochlorothiazide (ZIAC) 5-6.25 MG tablet Take 1 tablet Daily for BP  . Botulinum Toxin Type A 200 units SOLR Inject into the skin.  . Calcium Carbonate-Vitamin D (CALCIUM + D PO) Take 500 mg by mouth 3 (three) times daily.   . cetaphil (CETAPHIL) lotion Apply topically 2 (two) times daily.  . cetirizine (ZYRTEC) 10 MG tablet Take 10 mg by mouth daily.  . Cholecalciferol (VITAMIN D PO) Take 10,000-20,000 Units by mouth 2 (two) times daily. 20000 in the morning and 10000 in the evening  . Cinnamon 500 MG capsule Take by mouth.  . clotrimazole-betamethasone (LOTRISONE) cream APPLY TO AFFECTED AREA TWICE A DAY  . co-enzyme Q-10  30 MG capsule Take 30 mg by mouth daily.  . cyclobenzaprine (FLEXERIL) 10 MG tablet Take 10 mg by mouth 3 (three) times daily.   . diclofenac sodium (VOLTAREN) 1 % GEL Apply 2 to 4 grams Topically 2 to 4 x /day for Pain & Inflammation  . donepezil (ARICEPT) 23 MG TABS tablet Take 23 mg by mouth at bedtime.  [name] doxycycline (VIBRAMYCIN) 100 MG capsule TAKE 1 CAPSULE BY MOUTH TWICE A DAY  . DUPIXENT 300 MG/2ML SOSY RX1: INJECT 2 SYRINGES UNDER THE SKIN (SUBCUTANEOUS INJECTION) ON DAY 1 , THEN 1 SYRINGE ON DAY 15 AND EVERY OTHER WEEK THEREAFTER  . Eluxadoline (VIBERZI) 100 MG TABS Take 1  tablet by mouth 2 (two) times daily.   [name] EPINEPHrine 0.3 mg/0.3 mL IJ SOAJ injection Inject as directed.  . fluticasone (FLONASE) 50 MCG/ACT nasal spray Use 1 to 2 sprays each Nostril 2 x /day  . Fluticasone-Umeclidin-Vilant (TRELEGY ELLIPTA) 100-62.5-25 MCG/INH AEPB Inhale 1 puff into the lungs daily.  . furosemide (LASIX) 80 MG tablet TAKE 1 TO 1 & 1/2 TABLETS 2 X /DAY FOR FLUID RETENTION & SWELLING OF LEGS  . GLUCOSAMINE-CHONDROITIN PO Take 3 tablets by mouth daily.  [name] guaiFENesin (MUCINEX) 600 MG 12 hr tablet Take 600 mg by mouth 2 (two) times daily as needed for cough or to loosen phlegm. Instructed to take 3 tablets in the morning  . ipratropium-albuterol (DUONEB) 0.5-2.5 (3) MG/3ML SOLN Take 3 mLs by nebulization every 4 (four) hours as needed.  [name] KRILL OIL PO Take by mouth.  [name] l-methylfolate-B6-B12 (METANX) 3-35-2 MG TABS tablet Take 1 tablet by mouth daily.  . LUTEIN PO Take 1 tablet by mouth 2 (two) times daily.  [name] LYRICA 150 MG capsule Take 2 capsules (300 mg total) by mouth 2 (two) times daily.  . memantine (NAMENDA) 10 MG tablet Take 10 mg by mouth daily.   . metFORMIN (GLUCOPHAGE-XR) 500 MG 24 hr tablet Take 2 tablets 2 x /day with Breakfast & Supper  for Diabetes  . metolazone (ZAROXOLYN) 5 MG tablet Take 1 tablet daily for Fluid Retention  . montelukast (SINGULAIR) 10 MG tablet TAKE 1 TABLET BY MOUTH EVERY DAY  . Multiple Vitamins-Minerals (MULTIVITAMIN WITH MINERALS) tablet Take 1 tablet by mouth daily.  . naloxone (NARCAN) nasal spray 4 mg/0.1 mL USE AS DIRECTED  . ondansetron (ZOFRAN-ODT) 4 MG disintegrating tablet TAKE 1 TABLET (4 MG TOTAL) BY MOUTH EVERY 6 (SIX) HOURS AS NEEDED FOR NAUSEA OR VOMITING.  [name] oxyCODONE-acetaminophen (PERCOCET) 10-325 MG tablet Take 1 tablet by mouth daily as needed.  [name] oxymetazoline (AFRIN) 0.05 % nasal spray Place 1 spray into both nostrils See admin instructions. 1 SPRAY PER SIDE EACH NIGHT BEFORE DYMISTA  . pantoprazole (PROTONIX) 40 MG tablet  Take 1 tablet Daily for Acid Indigestion & Reflux  . pentosan polysulfate (ELMIRON) 100 MG capsule Take 100 mg by mouth 3 (three) times daily before meals.   . potassium chloride SA (KLOR-CON M20) 20 MEQ tablet Take 1 tablet 2 x  /day for Potassium  . pravastatin (PRAVACHOL) 40 MG tablet TAKE 1 TABLET BY MOUTH EVERYDAY AT BEDTIME  . PRESCRIPTION MEDICATION Pt receives weekly allergy shots  . PROBIOTIC PRODUCT PO Take by mouth.  . promethazine-dextromethorphan (PROMETHAZINE-DM) 6.25-15 MG/5ML syrup Take 5 mLs by mouth 4 (four) times daily as needed for cough.  . sertraline (ZOLOFT) 50 MG tablet Take 50 mg by mouth 2 (two) times daily.   . sodium chloride (OCEAN) 0.65 %  SOLN nasal spray Place 1 spray into both nostrils 4 (four) times daily as needed for congestion. Uses each time before other nasal sprays  . tadalafil (CIALIS) 5 MG tablet Take 5 mg by mouth every evening.   . triamcinolone cream (KENALOG) 0.1 % APPLY TOPICALLY 4 TIMES A DAY (Patient taking differently: APPLY TOPICALLY 2 TIMES A DAY AS NEEDED for itching)  . trospium (SANCTURA) 20 MG tablet Take 20 mg by mouth 2 (two) times daily.   . TURMERIC PO Take 3 capsules by mouth daily.   Current Facility-Administered Medications on File Prior to Visit  Medication  . Mepolizumab SOLR 100 mg    Allergies:  Allergies  Allergen Reactions  . Bee Venom Swelling  . Ppd [Tuberculin Purified Protein Derivative] Other (See Comments)    +ppd NEG Quantferron Gold 3/13  . Fenofibrate Other (See Comments)    Back pain  . Levofloxacin Diarrhea  . Other Other (See Comments)    Some antibiotics cause diarrhea  . Verapamil Other (See Comments)    Back pain  . Claritin [Loratadine] Other (See Comments)    unknown  . Duloxetine     Brought on asthma     Medical History:  Past Medical History:  Diagnosis Date  . Anesthesia complication requiring reversal agent administration    ? from central apnea, very difficult to get off vent  .  Anxiety   . Arthritis    osteo  . Asthma   . BPH (benign prostatic hyperplasia)   . Complication of anesthesia    difficulty waking , they twlight me because of my respiratory problems "  . Dyspnea   . Enlarged heart   . Family history of adverse reaction to anesthesia    mother trouble waking up, and heart stopped  . GERD (gastroesophageal reflux disease)   . Headache    botox injections for headaches  . Hyperlipidemia   . Hypertension   . Hypogonadism male   . IBS (irritable bowel syndrome)   . Obesity   . OSA (obstructive sleep apnea)    cpap  . Pneumonia   . Pre-diabetes   . Prostatitis     Family history- Reviewed and unchanged   Social history- Reviewed and unchanged   Names of Other Physician/Practitioners you currently use: 1. [facility] here for primary care 2. Eye Exam: DUE for 2020 3. Dental Exam DUE for 2020   Patient Care Team: [name], MD as PCP - General (Internal Medicine) Lorretta Harp, MD as PCP - Cardiology (Cardiology) Elayne Snare, MD as Consulting Physician (Endocrinology) Tommy Medal, Lavell Islam, MD as Consulting Physician (Infectious Diseases) Bronson Ing, DPM as Consulting Physician (Podiatry)   Screening Tests: Immunization History  Administered Date(s) Administered  . Influenza,inj,Quad PF,6+ Mos 03/04/2016, 03/09/2017, 05/19/2018  . Influenza-Unspecified 02/24/2015, 05/19/2018, 01/24/2019  . PPD Test 08/06/2011  . Pneumococcal Conjugate-13 11/02/2014  . Pneumococcal Polysaccharide-23 04/10/2016  . Pneumococcal-Unspecified 05/26/2004  . Td 05/26/2000  . Tdap 08/01/2013  . Zoster 05/26/2009  . Zoster Recombinat (Shingrix) 11/05/2016, 03/14/2017     Vaccinations: TD or Tdap: 2015  Influenza: 12/2018  Pneumococcal: 2017 Prevnar13: 2016 Shingles: Zostavax/Shingrix: 2018   Preventative Care: Last colonoscopy: 2007, 02/2019, 5years   Hep C screening (1945-1965):  07/2017   Imaging: Chest X-ray: EKG: ECHO:    Review of Systems:  ROS    Physical Exam: BP 122/70   Pulse 85   Temp (!) 96.6 F (35.9 C)  Ht 6' (1.829 m)   Wt (!) 343 lb 12.8 oz (155.9 kg)   SpO2 97%   BMI 46.63 kg/m  Wt Readings from Last 3 Encounters:  04/05/19 (!) 343 lb 12.8 oz (155.9 kg)  03/28/19 (!) 345 lb (156.5 kg)  02/17/19 (!) 350 lb (158.8 kg)   General Appearance: Well nourished, in no apparent distress. Eyes: PERRLA, EOMs, conjunctiva no swelling or erythema Sinuses: No Frontal/maxillary tenderness ENT/Mouth: Ext aud canals clear, TMs without erythema, bulging. No erythema, swelling, or exudate on post pharynx.  Tonsils not swollen or erythematous. Hearing normal.  Neck: Supple, thyroid normal.  Respiratory: Respiratory effort normal, BS equal bilaterally without rales, rhonchi, wheezing or stridor.  Cardio: RRR with no MRGs. Brisk peripheral pulses without edema.  Abdomen: Soft, obese, + BS.  Non tender, no guarding, rebound, hernias, masses. Musculoskeletal: Full ROM, 5/5 strength, Ambulates with walker cane Skin: Warm, dry without rashes, lesions, ecchymosis.  Neuro: Cranial nerves intact. No cerebellar symptoms.  Psych: Awake and oriented X 3, normal affect, Insight and Judgment appropriate.    Garnet Sierras, NP [facility] 4:28 PM

## 2019-04-06 LAB — VITAMIN B12: Vitamin B-12: 1002 pg/mL (ref 200–1100)

## 2019-04-06 LAB — URINALYSIS W MICROSCOPIC + REFLEX CULTURE
Bacteria, UA: NONE SEEN /HPF
Bilirubin Urine: NEGATIVE
Glucose, UA: NEGATIVE
Hgb urine dipstick: NEGATIVE
Hyaline Cast: NONE SEEN /LPF
Leukocyte Esterase: NEGATIVE
Nitrites, Initial: NEGATIVE
RBC / HPF: NONE SEEN /HPF (ref 0–2)
Specific Gravity, Urine: 1.026 (ref 1.001–1.03)
WBC, UA: NONE SEEN /HPF (ref 0–5)
pH: <=5 (ref 5.0–8.0)

## 2019-04-06 LAB — CBC WITH DIFFERENTIAL/PLATELET
Absolute Monocytes: 1026 cells/uL — ABNORMAL HIGH (ref 200–950)
Basophils Absolute: 105 cells/uL (ref 0–200)
Basophils Relative: 1.1 %
Eosinophils Absolute: 247 cells/uL (ref 15–500)
Eosinophils Relative: 2.6 %
HCT: 46.6 % (ref 38.5–50.0)
Hemoglobin: 15.3 g/dL (ref 13.2–17.1)
Lymphs Abs: 3078 cells/uL (ref 850–3900)
MCH: 29.3 pg (ref 27.0–33.0)
MCHC: 32.8 g/dL (ref 32.0–36.0)
MCV: 89.1 fL (ref 80.0–100.0)
MPV: 10.7 fL (ref 7.5–12.5)
Monocytes Relative: 10.8 %
Neutro Abs: 5045 cells/uL (ref 1500–7800)
Neutrophils Relative %: 53.1 %
Platelets: 248 10*3/uL (ref 140–400)
RBC: 5.23 10*6/uL (ref 4.20–5.80)
RDW: 12.9 % (ref 11.0–15.0)
Total Lymphocyte: 32.4 %
WBC: 9.5 10*3/uL (ref 3.8–10.8)

## 2019-04-06 LAB — HEMOGLOBIN A1C
Hgb A1c MFr Bld: 5.2 % of total Hgb (ref <5.7)
Mean Plasma Glucose: 103 (calc)
eAG (mmol/L): 5.7 (calc)

## 2019-04-06 LAB — COMPLETE METABOLIC PANEL WITH GFR
AG Ratio: 2.1 (calc) (ref 1.0–2.5)
ALT: 25 U/L (ref 9–46)
AST: 20 U/L (ref 10–35)
Albumin: 4 g/dL (ref 3.6–5.1)
Alkaline phosphatase (APISO): 61 U/L (ref 35–144)
BUN: 10 mg/dL (ref 7–25)
CO2: 30 mmol/L (ref 20–32)
Calcium: 9 mg/dL (ref 8.6–10.3)
Chloride: 102 mmol/L (ref 98–110)
Creat: 0.7 mg/dL (ref 0.70–1.33)
GFR, Est African American: 121 mL/min/{1.73_m2} (ref >=60)
GFR, Est Non African American: 104 mL/min/{1.73_m2} (ref >=60)
Globulin: 1.9 g/dL (calc) (ref 1.9–3.7)
Glucose, Bld: 92 mg/dL (ref 65–99)
Potassium: 4.4 mmol/L (ref 3.5–5.3)
Sodium: 142 mmol/L (ref 135–146)
Total Bilirubin: 1 mg/dL (ref 0.2–1.2)
Total Protein: 5.9 g/dL — ABNORMAL LOW (ref 6.1–8.1)

## 2019-04-06 LAB — LIPID PANEL
Cholesterol: 141 mg/dL (ref <200)
HDL: 29 mg/dL — ABNORMAL LOW (ref >=40)
LDL Cholesterol (Calc): 77 mg/dL (calc)
Non-HDL Cholesterol (Calc): 112 mg/dL (calc) (ref <130)
Total CHOL/HDL Ratio: 4.9 (calc) (ref <5.0)
Triglycerides: 294 mg/dL — ABNORMAL HIGH (ref <150)

## 2019-04-06 LAB — NO CULTURE INDICATED

## 2019-04-06 LAB — VITAMIN D 25 HYDROXY (VIT D DEFICIENCY, FRACTURES): Vit D, 25-Hydroxy: 65 ng/mL (ref 30–100)

## 2019-04-06 LAB — MAGNESIUM: Magnesium: 1.9 mg/dL (ref 1.5–2.5)

## 2019-04-06 LAB — INSULIN, RANDOM: Insulin: 27.5 u[IU]/mL — ABNORMAL HIGH

## 2019-04-07 ENCOUNTER — Encounter: Payer: Self-pay | Admitting: *Deleted

## 2019-04-08 ENCOUNTER — Telehealth: Payer: Self-pay | Admitting: *Deleted

## 2019-04-08 NOTE — Telephone Encounter (Signed)
   Primary Cardiologist: Quay Burow, MD  Chart reviewed as part of pre-operative protocol coverage. Patient was contacted 04/08/2019 in reference to pre-operative risk assessment for pending surgery as outlined below.  Mark Harrell was last seen on 02/02/19 by Dr. [name]. He has history of HTN, HLD, COPD on O2 2L, OSA not on CPAP, esophageal spasm, morbid obesity, and lower extremity edema. He was actually cleared for this procedure back in 05/2018 as well as 11/2018 but surgery delayed due to Covid. Tried to call patient x3  but rang numerous times before finally going to VM on the 3rd try. Unfortunately his voicemail box is full and cannot leave a message. Will need to try again another time. Will route this message to surgical team so they are aware we have received clearance - if they happen to speak to patient please make patient aware we have tried to reach him.   Charlie Pitter, PA-C 04/08/2019, 11:55 AM

## 2019-04-08 NOTE — Telephone Encounter (Signed)
   [facility] Pre-operative Risk Assessment    Request for surgical clearance: THE SAME CLEARANCE REQUEST WAS PUT IN 11/2018. LOOKS LIKE PT DID NOT HAVE THE SURGERY AND THIS IS A NEW REQUEST 1. What type of surgery is being performed? CERVICAL FUSION C5-6, C6-7   2. When is this surgery scheduled? TBD   3. What type of clearance is required (medical clearance vs. Pharmacy clearance to hold med vs. Both)? MEDICAL  4. Are there any medications that need to be held prior to surgery and how long? NONE LISTED    5. Practice name and name of physician performing surgery? [facility]; DR. NITKA   6. What is your office phone number [phone]    7.   What is your office fax number [phone] ATTN: SHERRIE  8.   Anesthesia type (None, local, MAC, general) ? NOT LISTED; LEFT MESSAGE TO CALL WITH TYPE OF ANESTHESIA BEING USED   Mark Harrell 04/08/2019, 10:05 AM  _________________________________________________________________   (provider comments below)

## 2019-04-08 NOTE — Telephone Encounter (Signed)
Dr. Otho Ket office called and said anesthesia is General .

## 2019-04-11 NOTE — Telephone Encounter (Signed)
Patient returned call. In summary, he has a history of HTN, HLD, COPD on O2 2L, OSA not on CPAP, esophageal spasm, morbid obesity, and lower extremity edema. He was actually cleared for this procedure back in 05/2018 as well as 11/2018 but surgery delayed due to Covid. He was last seen by Dr. [name] on 02/02/2019 at which point Dr. [name] felt he was at acceptable risk for procedure with "mildly elevated risk because of obesity." When I spoke with patient on the phone today, he denied any new or worsening symptoms since visit with Dr. [name]. Stable dyspnea and no chest pain. He is able to complete at least 4.0 METS. Therefore, based on ACC/AHA guidelines, Mark Harrell would be at acceptable risk for the planned procedure without further cardiovascular testing.   I will route this recommendation to the requesting party via Epic fax function and remove from pre-op pool.  Please call with questions.  Darreld Mclean, PA-C 04/11/2019, 10:11 AM     .

## 2019-04-11 NOTE — Telephone Encounter (Signed)
Called and left message for patient to call back.

## 2019-04-14 ENCOUNTER — Telehealth: Payer: Self-pay | Admitting: *Deleted

## 2019-04-14 NOTE — Telephone Encounter (Signed)
L/m for patient Dupixent approval end 12/8 needs appt with provider for reapproval

## 2019-04-17 DIAGNOSIS — J449 Chronic obstructive pulmonary disease, unspecified: Secondary | ICD-10-CM | POA: Diagnosis not present

## 2019-04-17 DIAGNOSIS — J9611 Chronic respiratory failure with hypoxia: Secondary | ICD-10-CM | POA: Diagnosis not present

## 2019-04-21 DIAGNOSIS — J449 Chronic obstructive pulmonary disease, unspecified: Secondary | ICD-10-CM | POA: Diagnosis not present

## 2019-04-21 DIAGNOSIS — G4733 Obstructive sleep apnea (adult) (pediatric): Secondary | ICD-10-CM | POA: Diagnosis not present

## 2019-04-21 DIAGNOSIS — R069 Unspecified abnormalities of breathing: Secondary | ICD-10-CM | POA: Diagnosis not present

## 2019-04-26 NOTE — Telephone Encounter (Signed)
Patient called back and scheduled appointment for 05/03/2019 at 5:15 with Dr. [name].

## 2019-04-27 DIAGNOSIS — E559 Vitamin D deficiency, unspecified: Secondary | ICD-10-CM | POA: Diagnosis not present

## 2019-04-27 DIAGNOSIS — R809 Proteinuria, unspecified: Secondary | ICD-10-CM | POA: Diagnosis not present

## 2019-04-27 DIAGNOSIS — E876 Hypokalemia: Secondary | ICD-10-CM | POA: Diagnosis not present

## 2019-04-27 DIAGNOSIS — I129 Hypertensive chronic kidney disease with stage 1 through stage 4 chronic kidney disease, or unspecified chronic kidney disease: Secondary | ICD-10-CM | POA: Diagnosis not present

## 2019-04-28 ENCOUNTER — Other Ambulatory Visit: Payer: Self-pay

## 2019-04-28 ENCOUNTER — Ambulatory Visit (INDEPENDENT_AMBULATORY_CARE_PROVIDER_SITE_OTHER): Payer: BC Managed Care – PPO | Admitting: Specialist

## 2019-04-28 ENCOUNTER — Encounter: Payer: Self-pay | Admitting: Specialist

## 2019-04-28 VITALS — BP 111/75 | HR 88 | Ht 72.0 in | Wt 345.0 lb

## 2019-04-28 DIAGNOSIS — M5126 Other intervertebral disc displacement, lumbar region: Secondary | ICD-10-CM

## 2019-04-28 DIAGNOSIS — M25511 Pain in right shoulder: Secondary | ICD-10-CM

## 2019-04-28 DIAGNOSIS — R29898 Other symptoms and signs involving the musculoskeletal system: Secondary | ICD-10-CM

## 2019-04-28 DIAGNOSIS — M4802 Spinal stenosis, cervical region: Secondary | ICD-10-CM | POA: Diagnosis not present

## 2019-04-28 DIAGNOSIS — M25512 Pain in left shoulder: Secondary | ICD-10-CM

## 2019-04-28 DIAGNOSIS — G8929 Other chronic pain: Secondary | ICD-10-CM

## 2019-04-28 DIAGNOSIS — M5136 Other intervertebral disc degeneration, lumbar region: Secondary | ICD-10-CM

## 2019-04-28 MED ORDER — DIAZEPAM 5 MG PO TABS: ORAL_TABLET | ORAL | 0 refills | Status: DC

## 2019-04-28 MED ORDER — HYDROCODONE-ACETAMINOPHEN 7.5-325 MG PO TABS: 1.0000 | ORAL_TABLET | Freq: Four times a day (QID) | ORAL | 0 refills | Status: DC | PRN
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
## 2019-04-28 NOTE — Patient Instructions (Addendum)
Plan:Avoid overhead lifting and overhead use of the arms. Do not lift greater than 5 lbs. Adjust head rest in vehicle to prevent hyperextension if rear ended. Take extra precautions to avoid falling, including use of a cane if you feel weak. Scheduling secretary Kandice Hams. will call you to arrange for surgery for your cervical spine. If you wish a second opinion please let us know and we can arrange for you. If you have worsening arm or leg numbness or weakness please call or go to an ER. Surgery will be an anterior cervical discectomy and fusion at the C5-6 and C6-7 levels with decompression of the cervical spinal canal at C6-7 and C5-6 and removal of spur and disc pressing on the spinal cord. An additional decompression fusion is done at the C5-6 and C6-7 level with bone grafting and plate and screws, Local bone graft as well and allograft bone graft and vivigen.  Risks of surgery include risks of infection, bleeding and risks to the spinal cord and  Risks of sore throat and difficulty swallowing which should Improve over the next 4-6 weeks following surgery. Surgery is indicated due to upper extremity radiculopathy, lermittes phenomena and falls. In the future surgery at adjacent levels may be necessary but these levels do not appear to be related to your current symptoms or signs. MRI of the shoulders ordered.

## 2019-04-29 ENCOUNTER — Other Ambulatory Visit: Payer: Self-pay

## 2019-05-01 ENCOUNTER — Other Ambulatory Visit: Payer: Self-pay | Admitting: Adult Health

## 2019-05-01 ENCOUNTER — Other Ambulatory Visit: Payer: Self-pay | Admitting: Internal Medicine

## 2019-05-03 ENCOUNTER — Ambulatory Visit: Payer: BC Managed Care – PPO | Admitting: Allergy & Immunology

## 2019-05-03 ENCOUNTER — Other Ambulatory Visit: Payer: Self-pay

## 2019-05-03 ENCOUNTER — Encounter: Payer: Self-pay | Admitting: Allergy & Immunology

## 2019-05-03 VITALS — BP 136/88 | HR 88 | Temp 97.2°F | Resp 20 | Ht 72.0 in | Wt 339.4 lb

## 2019-05-03 DIAGNOSIS — J449 Chronic obstructive pulmonary disease, unspecified: Secondary | ICD-10-CM

## 2019-05-03 DIAGNOSIS — B999 Unspecified infectious disease: Secondary | ICD-10-CM | POA: Diagnosis not present

## 2019-05-03 DIAGNOSIS — J302 Other seasonal allergic rhinitis: Secondary | ICD-10-CM

## 2019-05-03 DIAGNOSIS — J3089 Other allergic rhinitis: Secondary | ICD-10-CM

## 2019-05-03 DIAGNOSIS — L2084 Intrinsic (allergic) eczema: Secondary | ICD-10-CM | POA: Diagnosis not present

## 2019-05-03 MED ORDER — AZELASTINE-FLUTICASONE 137-50 MCG/ACT NA SUSP: NASAL | 5 refills | Status: DC

## 2019-05-03 MED ORDER — TRELEGY ELLIPTA 100-62.5-25 MCG/INH IN AEPB: 1.0000 | INHALATION_SPRAY | Freq: Every day | RESPIRATORY_TRACT | 2 refills | Status: DC

## 2019-05-03 MED ORDER — EPINEPHRINE 0.3 MG/0.3ML IJ SOAJ: 0.3000 mg | INTRAMUSCULAR | 2 refills | Status: DC | PRN

## 2019-05-03 NOTE — Patient Instructions (Signed)
1. 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 - I think we can hold off on the immunoglobulin replacement for now since you are doing so well.   2. Asthma-COPD overlap syndrome - Lung testing looked stable today. - It seems that we are on a good combination of medications. - Daily controller medication(s): Dupixent 300mg  every two weeks and Trelegy 100/62.5/25 one puff once daily - Prior to physical activity: ProAir 2 puffs 10-15 minutes before physical activity. - Rescue medications: ProAir 4 puffs every 4-6 hours as needed or DuoNeb nebulizer one vial every 4-6 hours as needed - Asthma control goals:  * Full participation in all desired activities (may need albuterol before activity) * Albuterol use two time or less a week on average (not counting use with activity) * Cough interfering with sleep two time or less a month * Oral steroids no more than once a year * No hospitalizations   3. Chronic allergic rhinitis (sweet vernal grass, box elder, cat, weeds, ragweed, molds, cockroach, dust mite) - Continue with Dymista 2 sprays per nostril 1-2 times daily. - We will try to resubmit for it and send the generic in. - Use Afrin 1-2 sprays prior to the Dymista at night.  - Continue with saline mist 1-2 times daily.  - Continue with cetirizine 10mg  daily as needed for breakthrough symptoms.  4. GERD - Continue famotidine as needed.  5. Return in about 4 months (around 09/01/2019). This can be an in-person, a virtual Webex or a telephone follow up visit.   Please inform us of any Emergency Department visits, hospitalizations, or changes in symptoms. Call us before going to the ED for breathing or allergy symptoms since we might be able to fit you in for a sick visit. Feel free to contact us anytime with any questions, problems, or concerns.  It was a pleasure to see you again  today! Give my best to Ms. Bonnita Nasuti!   Websites that have reliable patient information: 1. American Academy of Asthma, Allergy, and Immunology: www.aaaai.org 2. Food Allergy Research and Education (FARE): foodallergy.org 3. Mothers of Asthmatics: http://www.asthmacommunitynetwork.org 4. American College of Allergy, Asthma, and Immunology: www.acaai.org  "Like" [place] on Facebook and Instagram for our latest updates!      Make sure you are registered to vote! If you have moved or changed any of your contact information, you will need to get this updated before voting!  In some cases, you MAY be able to register to vote online: [email or web address]

## 2019-05-03 NOTE — Progress Notes (Signed)
FOLLOW UP  Date of Service/Encounter:  05/03/19   Assessment:   Perennial and seasonal allergic rhinitis(sweet vernal grass, box elder, cat, weeds, ragweed, molds, cockroach, dust mite)  Recurrent infections- doing well on prophylactic doxycycline today  Asthma-COPD overlap syndrome-onDupixent  Hymenoptera allergy  GERD - on PRN H2 blocker   Obesity - with resulting nerve impingement and joint pain  Migraines - on botulinum injections  Polypharmacy   Plan/Recommendations:   1. 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 - If this does not work, we will apply again for the immunoglobulin replacement.  - I think we can hold off on the immunoglobulin replacement since you have not needed any antibiotics since November.   2. Asthma-COPD overlap syndrome - Lung testing looked stable today. - It seems that we are on a good combination of medications. - Daily controller medication(s): Dupixent 300mg  every two weeks and Trelegy 100/62.5/25 one puff once daily - Prior to physical activity: ProAir 2 puffs 10-15 minutes before physical activity. - Rescue medications: ProAir 4 puffs every 4-6 hours as needed or DuoNeb nebulizer one vial every 4-6 hours as needed - Asthma control goals:  * Full participation in all desired activities (may need albuterol before activity) * Albuterol use two time or less a week on average (not counting use with activity) * Cough interfering with sleep two time or less a month * Oral steroids no more than once a year * No hospitalizations   3. Chronic allergic rhinitis (sweet vernal grass, box elder, cat, weeds, ragweed, molds, cockroach, dust mite) - Continue with Dymista 2 sprays per nostril 1-2 times daily. - Use Afrin 1-2 sprays prior to the Dymista at night.  - Continue with saline mist 1-2 times daily.  - Continue  with cetirizine 10mg  daily as needed for breakthrough symptoms.  4. GERD - Continue with famotidine as needed.   5. No follow-ups on file. This can be an in-person, a virtual Webex or a telephone follow up visit.   Subjective:   Mark Harrell is a 58 y.o. 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/2017   Cervicalgia 04/01/2017   Cervical spondylosis with radiculopathy 02/09/2017   Memory difficulty 12/05/2016   Morbid obesity ([place]) 12/05/2016   Diarrhea in adult patient 12/03/2016   Internal hemorrhoids 12/03/2016   Headache 11/13/2016   Migraine without aura and without status migrainosus, not intractable 10/30/2016   Irritant contact dermatitis due to frequent handwashing 06/08/2016   Lumbar radiculopathy 05/15/2016   Osteoarthritis of spine with radiculopathy, lumbar region 05/15/2016   Risk for falls 05/15/2016   Acute medial meniscal tear, left, subsequent encounter 04/23/2016   Recurrent infections 03/29/2016   Chronic nonseasonal allergic rhinitis due to pollen 03/29/2016   Polypharmacy 01/16/2016   Morbid obesity with BMI of 50.0-59.9, adult ([place]) 03/06/2015   SDAT  02/05/2015   Asthma 02/05/2015   OSA and COPD overlap syndrome ([place]) 02/05/2015   Medication management 08/02/2014   GERD (gastroesophageal reflux disease) 05/09/2014   Vitamin D deficiency 08/01/2013   Prediabetes 08/01/2013   Positive TB  test 07/29/2011   Diverticula of colon 05/07/2011   Hypertension 01/31/2011   Hyperlipidemia, mixed 01/31/2011   BPH (benign prostatic hyperplasia) 01/31/2011   Testosterone Deficiency 01/31/2011   IBS (irritable bowel syndrome) 01/31/2011   Partial complex seizure disorder with intractable epilepsy ([place]) 01/31/2011   Depression, major, recurrent, in partial remission ([place]) 01/31/2011   Asthma-COPD overlap syndrome ([place]) 01/31/2011    History obtained from: chart review and patient.  Mark Harrell is a 58 y.o. male presenting for a follow up visit.  He was last seen in June 2020.  At that time, we continued with his prophylactic doxycycline 100 mg twice daily.  For his asthma COPD overlap, we did not do lung testing.  We did continue with the Dupixent 300 mg every 2 weeks and Trelegy 1 puff once daily.  We also continued ProAir as needed.  For his allergic rhinitis, we continue with Dymista, cetirizine as needed, and Afrin.  We did discuss the side effects of chronic Afrin use.  Since last visit, he has done well. He looks pretty good today and appears to have lost quite a bit of weight.   Asthma/Respiratory Symptom History: He remains on the Dupixent every two weeks which seems to be working well for him. He has only required prednisone twice in calendar year 2020. This is a new low for him.  Eisen's asthma has been well controlled. He has not required rescue medication, experienced nocturnal awakenings due to lower respiratory symptoms, nor have activities of daily living been limited. He has required no Emergency Department or Urgent Care visits for his asthma. He has required zero courses of systemic steroids for asthma exacerbations since the last visit. ACT score today is 25, indicating excellent asthma symptom control.   Allergic Rhinitis Symptom History: He recently had to change his nose spray to be divided. He would like to change back to [employer] [facility] since this was much easier for him to do.  He also remains on cetirizine as needed.  Recurrent Infections Symptom History: He remains on the doxycyline twice daily. He does think that this has decreased the amount of infections that he has had. The lock down seems to have helped too, but overall 2020 has not required as many infections. He has not been in the hospital in quite some time.   He is supposed to have surgery on December 28th for his orthopedic surgery. He is hoping that this is not going to be rescheduled with the rising COVID19 cases. He continues to live with his mother and is her primary caretaker. However, his sister has moved back in as well and is taking a more active role in her treatment.   Otherwise, there have been no changes to his past medical history, surgical history, family history, or social history.    Review of Systems  Constitutional: Negative.  Negative for chills, fever, malaise/fatigue and weight loss.  HENT: Negative.  Negative for congestion, ear discharge, ear pain and sinus pain.   Eyes: Negative for pain, discharge and redness.  Respiratory: Positive for shortness of breath. Negative for cough, sputum production and wheezing.   Cardiovascular: Negative.  Negative for chest pain and palpitations.  Gastrointestinal: Negative for abdominal pain, constipation, diarrhea, heartburn, nausea and vomiting.  Skin: Negative.  Negative for itching and rash.  Neurological: Negative for dizziness and headaches.  Endo/Heme/Allergies: Negative for environmental allergies. Does not bruise/bleed easily.       Objective:   Blood pressure 136/88, pulse 88, temperature (!) 97.2 F (36.2 C), temperature source Temporal, resp. rate 20, height 6' (1.829 m), weight (!) 339 lb 6.4 oz (154 kg), SpO2 93 %. Body mass index is 46.03 kg/m.   Physical Exam:  Physical Exam  Constitutional: He appears well-developed.  Pleasant male. Talkative.   HENT:  Head: Normocephalic and atraumatic.  Right Ear: Tympanic  membrane, external ear and ear canal normal.  Left Ear: Tympanic membrane, external ear and ear canal normal.  Nose: Mucosal edema and rhinorrhea present. No nasal deformity or septal deviation. No epistaxis. Right sinus exhibits no maxillary sinus tenderness and no frontal sinus tenderness. Left sinus exhibits no maxillary sinus tenderness and no frontal sinus tenderness.  Mouth/Throat: Uvula is midline and oropharynx is clear and moist. Mucous membranes are not pale and not dry.  Tonsils normal bilaterally.   Eyes: Pupils are equal, round, and reactive to light. Conjunctivae and EOM are normal. Right eye exhibits no chemosis and no discharge. Left eye exhibits no chemosis and no discharge. Right conjunctiva is not injected. Left conjunctiva is not injected.  Cardiovascular: Normal rate, regular rhythm and normal heart sounds.  Respiratory: Effort normal and breath sounds normal. No accessory muscle usage. No tachypnea. No respiratory distress. He has no wheezes. He has no rhonchi. He has no rales. He exhibits no tenderness.  No increased work of breathing noted. Difficult to hear air sounds due to body habitus.   Lymphadenopathy:    He has no cervical adenopathy.  Neurological: He is alert.  Skin: No abrasion, no petechiae and no rash noted. Rash is not papular, not vesicular and not urticarial. No erythema. No pallor.  No eczematous or urticarial lesions noted.   Psychiatric: He has a normal mood and affect.     Diagnostic studies:    Spirometry: results abnormal (FEV1: 2.54/64%, FVC: 3.53/68%, FEV1/FVC: 72%).    Spirometry consistent with possible restrictive disease.  Overall values are improved compared to the last spirometry in October 2019.  Allergy Studies: none       [name], MD  [facility]

## 2019-05-04 ENCOUNTER — Other Ambulatory Visit: Payer: Self-pay | Admitting: Specialist

## 2019-05-04 ENCOUNTER — Encounter: Payer: Self-pay | Admitting: Allergy & Immunology

## 2019-05-04 DIAGNOSIS — G8929 Other chronic pain: Secondary | ICD-10-CM

## 2019-05-04 DIAGNOSIS — M25512 Pain in left shoulder: Secondary | ICD-10-CM

## 2019-05-04 NOTE — Addendum Note (Signed)
Addended by: Daylene Posey T on: 05/04/2019 09:56 AM   Modules accepted: Orders

## 2019-05-09 DIAGNOSIS — G43709 Chronic migraine without aura, not intractable, without status migrainosus: Secondary | ICD-10-CM | POA: Diagnosis not present

## 2019-05-11 DIAGNOSIS — J449 Chronic obstructive pulmonary disease, unspecified: Secondary | ICD-10-CM | POA: Diagnosis not present

## 2019-05-11 DIAGNOSIS — G43709 Chronic migraine without aura, not intractable, without status migrainosus: Secondary | ICD-10-CM | POA: Diagnosis not present

## 2019-05-11 DIAGNOSIS — R419 Unspecified symptoms and signs involving cognitive functions and awareness: Secondary | ICD-10-CM | POA: Diagnosis not present

## 2019-05-11 DIAGNOSIS — G4733 Obstructive sleep apnea (adult) (pediatric): Secondary | ICD-10-CM | POA: Diagnosis not present

## 2019-05-11 DIAGNOSIS — F3341 Major depressive disorder, recurrent, in partial remission: Secondary | ICD-10-CM | POA: Diagnosis not present

## 2019-05-12 ENCOUNTER — Ambulatory Visit (INDEPENDENT_AMBULATORY_CARE_PROVIDER_SITE_OTHER): Payer: BC Managed Care – PPO | Admitting: Surgery

## 2019-05-12 ENCOUNTER — Other Ambulatory Visit: Payer: Self-pay

## 2019-05-12 ENCOUNTER — Encounter: Payer: Self-pay | Admitting: Surgery

## 2019-05-12 VITALS — BP 135/84 | HR 80 | Temp 98.8°F

## 2019-05-12 DIAGNOSIS — M4722 Other spondylosis with radiculopathy, cervical region: Secondary | ICD-10-CM

## 2019-05-12 NOTE — Progress Notes (Signed)
58 year old white male with history of C5-6 and C6-7 HNP/stenosis, neck pain and left upper extremity radiculopathy comes in for preop evaluation.  He continues have ongoing symptoms that are unchanged from last office visit.  He is want to proceed with C5-6 and C6-7 ACDF as scheduled.  We received preop cardiac clearance.  Patient was cleared by pulmonology earlier this year but nothing recent.  Patient has a history of obstructive sleep apnea but admits to not using his CPAP.  He is currently on O2 2 L.  Today history and physical performed.  Patient admits to exertional dyspnea.  No complaints of chest pain.  We will proceed with surgery as scheduled.  I did ask our surgery scheduler to see if we can get a letter from his pulmonologist.  I instructed patient to make sure he brings his CPAP mask with him to the hospital and I encouraged him to use it postop.  I advised him that he should also be using it as directed by pulmonology.  All questions answered.

## 2019-05-17 DIAGNOSIS — J9611 Chronic respiratory failure with hypoxia: Secondary | ICD-10-CM | POA: Diagnosis not present

## 2019-05-17 DIAGNOSIS — J449 Chronic obstructive pulmonary disease, unspecified: Secondary | ICD-10-CM | POA: Diagnosis not present

## 2019-05-17 NOTE — Pre-Procedure Instructions (Addendum)
FAY STEFKO  05/17/2019      [id number] - [place], [place] [id number] [place] [id number] Phone: [phone] Fax: [phone]  [place], [place] [id number] [place] [address] Phone: [phone] Fax: [phone]    Your procedure is scheduled on May 23, 2019.  Report to [facility] Entrance "A" at 530 AM.  Call this number if you have problems the morning of surgery:  [phone]   Call [phone] if you have any questions prior to your surgery date Monday-Friday 8am-4pm   Remember:  Do not eat  after midnight.  You may drink clear liquids until 430 AM .  Clear liquids allowed are:                    Water, Juice (non-citric and without pulp), Carbonated beverages, Clear Tea, Black Coffee only and Gatorade  Please complete your PRE-SURGERY ENSURE or WATER that was provided to you by 430 AM the morning of surgery.  Please, if able, drink it in one setting. DO NOT SIP.    Take these medicines the morning of surgery with A SIP OF WATER  Anastrozole (Arimidex) Doxycycline (Vibramycin) Eluxadoline (Viberzi) Trelegy Ellipta inhaler- bring with you Lyrica Sertraline (Zoloft) Trospium (Sanctura)  TAKE ONLY IF NEEDED: Tylenol Albuterol inhaler-bring with you Ipratropium-albuterol nebulizer-if needed Azelastine (Astelin) Nasal spray Baclofen (lioresal) Cyclobenzaprine (flexeril) Cetirizine (Zyrtec) Flonase nasal spray Hydrocodone-acetaminophen (norco) Ondansetron (zofran-ODT) Afrin nasal spray Ocean nasal spray    7 days prior to surgery STOP taking any Aspirin (unless otherwise instructed by your surgeon), diclofenac (Voltaren) gel, Aleve, Naproxen, Ibuprofen, Motrin, Advil, Goody's, BC's, all herbal medications, fish oil, and all vitamins  WHAT DO I DO ABOUT MY DIABETES MEDICATION?  [name] Do not take  oral diabetes medicines (pills) the morning of surgery Metformin (Glucophage).  Reviewed and Endorsed by [facility] Patient Education Committee, August 2015  How to Manage Your Diabetes Before and After Surgery  Why is it important to control my blood sugar before and after surgery? . Improving blood sugar levels before and after surgery helps healing and can limit problems. . A way of improving blood sugar control is eating a healthy diet by: o  Eating less sugar and carbohydrates o  Increasing activity/exercise o  Talking with your doctor about reaching your blood sugar goals . High blood sugars (greater than 180 mg/dL) can raise your risk of infections and slow your recovery, so you will need to focus on controlling your diabetes during the weeks before surgery. . Make sure that the doctor who takes care of your diabetes knows about your planned surgery including the date and location.  How do I manage my blood sugar before surgery? . Check your blood sugar at least 4 times a day, starting 2 days before surgery, to make sure that the level is not too high or low. o Check your blood sugar the morning of your surgery when you wake up and every 2 hours until you get to the Short Stay unit. . If your blood sugar is less than 70 mg/dL, you will need to treat for low blood sugar: o Do not take insulin. o Treat a low blood sugar (less than 70 mg/dL) with  cup of clear juice (cranberry or apple), 4 glucose tablets, OR glucose gel. Recheck blood sugar in 15 minutes after treatment (  to make sure it is greater than 70 mg/dL). If your blood sugar is not greater than 70 mg/dL on recheck, call [phone] o  for further instructions. . Report your blood sugar to the short stay nurse when you get to Short Stay.  . If you are admitted to the hospital after surgery: o Your blood sugar will be checked by the staff and you will probably be given insulin after surgery (instead of oral diabetes  medicines) to make sure you have good blood sugar levels. o The goal for blood sugar control after surgery is 80-180 mg/dL.  [place]- Preparing For Surgery  Before surgery, you can play an important role. Because skin is not sterile, your skin needs to be as free of germs as possible. You can reduce the number of germs on your skin by washing with CHG (chlorahexidine gluconate) Soap before surgery.  CHG is an antiseptic cleaner which kills germs and bonds with the skin to continue killing germs even after washing.    Oral Hygiene is also important to reduce your risk of infection.  Remember - BRUSH YOUR TEETH THE MORNING OF SURGERY WITH YOUR REGULAR TOOTHPASTE  Please do not use if you have an allergy to CHG or antibacterial soaps. If your skin becomes reddened/irritated stop using the CHG.  Do not shave (including legs and underarms) for at least 48 hours prior to first CHG shower. It is OK to shave your face.  Please follow these instructions carefully.   1. Shower the NIGHT BEFORE SURGERY and the MORNING OF SURGERY with CHG.   2. If you chose to wash your hair, wash your hair first as usual with your normal shampoo.  3. After you shampoo, rinse your hair and body thoroughly to remove the shampoo.  4. Use CHG as you would any other liquid soap. You can apply CHG directly to the skin and wash gently with a scrungie or a clean washcloth.   5. Apply the CHG Soap to your body ONLY FROM THE NECK DOWN.  Do not use on open wounds or open sores. Avoid contact with your eyes, ears, mouth and genitals (private parts). Wash Face and genitals (private parts)  with your normal soap.  6. Wash thoroughly, paying special attention to the area where your surgery will be performed.  7. Thoroughly rinse your body with warm water from the neck down.  8. DO NOT shower/wash with your normal soap after using and rinsing off the CHG Soap.  9. Pat yourself dry with a CLEAN TOWEL.  10. Wear CLEAN PAJAMAS  to bed the night before surgery, wear comfortable clothes the morning of surgery  11. Place CLEAN SHEETS on your bed the night of your first shower and DO NOT SLEEP WITH PETS.  Day of Surgery: Shower with CHG as instructed above Do not apply any deodorants/lotions.  Please wear clean clothes to the hospital/surgery center.   Remember to brush your teeth WITH YOUR REGULAR TOOTHPASTE.   Day of surgery:  Do not wear jewelry  Do not wear lotions, powders, or colognes, or deodorant.  Men may shave face and neck.  Do not bring valuables to the hospital.  [facility] is not responsible for any belongings or valuables.  IF you are a smoker, DO NOT Smoke 24 hours prior to surgery   IF you wear a CPAP at night please bring your mask, tubing, and machine the morning of surgery    Remember that you must have someone to  transport you home after your surgery, and remain with you for 24 hours if you are discharged the same day.  Contacts, dentures or bridgework may not be worn into surgery.  Leave your suitcase in the car.  After surgery it may be brought to your room.  For patients admitted to the hospital, discharge time will be determined by your treatment team.  Patients discharged the day of surgery will not be allowed to drive home.   Please read over the following fact sheets that you were given.

## 2019-05-18 ENCOUNTER — Encounter (HOSPITAL_COMMUNITY): Payer: Self-pay

## 2019-05-18 ENCOUNTER — Other Ambulatory Visit: Payer: Self-pay

## 2019-05-18 ENCOUNTER — Ambulatory Visit (HOSPITAL_COMMUNITY)
Admission: RE | Admit: 2019-05-18 | Discharge: 2019-05-18 | Disposition: A | Discharge status: Home / Self Care | Payer: BC Managed Care – PPO | Source: Ambulatory Visit | Attending: Surgery | Admitting: Surgery

## 2019-05-18 ENCOUNTER — Encounter (HOSPITAL_COMMUNITY)
Admission: RE | Admit: 2019-05-18 | Discharge: 2019-05-18 | Disposition: A | Discharge status: Home / Self Care | Payer: BC Managed Care – PPO | Source: Ambulatory Visit | Attending: Specialist | Admitting: Specialist

## 2019-05-18 DIAGNOSIS — Z01818 Encounter for other preprocedural examination: Secondary | ICD-10-CM | POA: Insufficient documentation

## 2019-05-18 HISTORY — DX: Dependence on supplemental oxygen: Z99.81

## 2019-05-18 HISTORY — DX: Other amnesia: R41.3

## 2019-05-18 LAB — COMPREHENSIVE METABOLIC PANEL
ALT: 28 U/L (ref 0–44)
AST: 26 U/L (ref 15–41)
Albumin: 3.7 g/dL (ref 3.5–5.0)
Alkaline Phosphatase: 54 U/L (ref 38–126)
Anion gap: 9 (ref 5–15)
BUN: 13 mg/dL (ref 6–20)
CO2: 27 mmol/L (ref 22–32)
Calcium: 9 mg/dL (ref 8.9–10.3)
Chloride: 103 mmol/L (ref 98–111)
Creatinine, Ser: 0.95 mg/dL (ref 0.61–1.24)
GFR calc Af Amer: >60 mL/min (ref >60)
GFR calc non Af Amer: >60 mL/min (ref >60)
Glucose, Bld: 99 mg/dL (ref 70–99)
Potassium: 4.2 mmol/L (ref 3.5–5.1)
Sodium: 139 mmol/L (ref 135–145)
Total Bilirubin: 1.4 mg/dL — ABNORMAL HIGH (ref 0.3–1.2)
Total Protein: 6.6 g/dL (ref 6.5–8.1)

## 2019-05-18 LAB — URINALYSIS, ROUTINE W REFLEX MICROSCOPIC
Bilirubin Urine: NEGATIVE
Glucose, UA: NEGATIVE mg/dL
Hgb urine dipstick: NEGATIVE
Ketones, ur: NEGATIVE mg/dL
Leukocytes,Ua: NEGATIVE
Nitrite: NEGATIVE
Protein, ur: NEGATIVE mg/dL
Specific Gravity, Urine: 1.019 (ref 1.005–1.030)
pH: 5 (ref 5.0–8.0)

## 2019-05-18 LAB — CBC
HCT: 49.1 % (ref 39.0–52.0)
Hemoglobin: 15.6 g/dL (ref 13.0–17.0)
MCH: 29.5 pg (ref 26.0–34.0)
MCHC: 31.8 g/dL (ref 30.0–36.0)
MCV: 93 fL (ref 80.0–100.0)
Platelets: 269 10*3/uL (ref 150–400)
RBC: 5.28 MIL/uL (ref 4.22–5.81)
RDW: 13.3 % (ref 11.5–15.5)
WBC: 9.8 10*3/uL (ref 4.0–10.5)
nRBC: 0 % (ref 0.0–0.2)

## 2019-05-18 LAB — PROTIME-INR
INR: 1 (ref 0.8–1.2)
Prothrombin Time: 13.3 seconds (ref 11.4–15.2)

## 2019-05-18 LAB — ABO/RH: ABO/RH(D): O POS

## 2019-05-18 LAB — TYPE AND SCREEN
ABO/RH(D): O POS
Antibody Screen: NEGATIVE

## 2019-05-18 LAB — GLUCOSE, CAPILLARY: Glucose-Capillary: 97 mg/dL (ref 70–99)

## 2019-05-18 LAB — SURGICAL PCR SCREEN
MRSA, PCR: NEGATIVE
Staphylococcus aureus: NEGATIVE

## 2019-05-18 NOTE — Progress Notes (Signed)
PCP - [name] Cardiologist - [name] Pulm: [place] Allergy/asthma: [place]   Chest x-ray - today EKG - 12/29/18 Stress Test - na ECHO - 10/18 Cardiac Cath - na  Sleep Study - 2017 CPAP - doesn't use  Fasting Blood Sugar - Pt. States he is not diabetic. Doesn't check sugars  Blood Thinner Instructions:na Aspirin Instructions:  ERAS Protcol -yes PRE-SURGERY : water bottle given  COVID TEST- 05/21/19   Anesthesia review: medical/resp. history  Patient denies shortness of breath, fever, cough and chest pain at PAT appointment   All instructions explained to the patient, with a verbal understanding of the material. Patient agrees to go over the instructions while at home for a better understanding. Patient also instructed to self quarantine after being tested for COVID-19. The opportunity to ask questions was provided.

## 2019-05-19 MED ORDER — DEXTROSE 5 % IV SOLN
3.0000 g | INTRAVENOUS | Status: AC
  Administered 2019-05-23 (×2): 3 g via INTRAVENOUS
  Filled 2019-05-19: qty 3

## 2019-05-19 NOTE — Anesthesia Preprocedure Evaluation (Addendum)
Anesthesia Evaluation  Patient identified by MRN, date of birth, ID band Patient awake    Reviewed: Allergy & Precautions, NPO status , Patient's Chart, lab work & pertinent test results  Airway Mallampati: II  TM Distance: >3 FB     Dental   Pulmonary shortness of breath and with exertion, asthma , sleep apnea , pneumonia, resolved, COPD, former smoker,    breath sounds clear to auscultation       Cardiovascular hypertension,  Rhythm:Regular Rate:Normal     Neuro/Psych  Headaches, Seizures -,  PSYCHIATRIC DISORDERS Anxiety  Neuromuscular disease    GI/Hepatic Neg liver ROS, GERD  ,  Endo/Other  negative endocrine ROS  Renal/GU negative Renal ROS     Musculoskeletal  (+) Arthritis ,   Abdominal   Peds  Hematology   Anesthesia Other Findings   Reproductive/Obstetrics                          Anesthesia Physical Anesthesia Plan  ASA: III  Anesthesia Plan: General   Post-op Pain Management:    Induction: Intravenous  PONV Risk Score and Plan: 2 and Ondansetron and Dexamethasone  Airway Management Planned: Oral ETT  Additional Equipment:   Intra-op Plan:   Post-operative Plan: Possible Post-op intubation/ventilation  Informed Consent:     Dental advisory given  Plan Discussed with: Anesthesiologist and CRNA  Anesthesia Plan Comments: (Pt follows with cardiology for management of CVD risk factors, no history of MI or CAD. Echo 10/18 showed normal LV size and function. Last seen by Dr. [name] 02/02/19 and cleared for surgery, "Since I saw him a year ago he is remained stable.  He did injure his neck and apparently needs laminectomy by Dr. [name] which we will clear him for a mildly elevated risk because of obesity.  LV function is normal by 2D echo performed 02/23/2017." He also commented that the pt had lost 65lbs since he had last seen him.  Follows with pulmonology for hx of  asthma/COPD, OSA, O2 dependent respiratory failure. Last seen 11/11/18 and per note asthma/COPD was well controlled on current regimen. He has severe OSA (AHI 65.7) but has not been able to tolerate CPAP - considering implant by ENT. He was recommended to continue supplemental O2 2 liters with activity and at night. When pt arrived to his PAT appt on 12/23 his oxygen saturation was 95% on RA, breathing was unlabored.   Follows with allergy and asthma for asthma/COPD overlap with recurrent infections. He is maintained on prophylactic doxycycline.  Last seen 05/03/19, doing well on current regimen, spirometry was stable, discussed upcoming surgery.   Pt reports had had an episode of prolonged/difficult emergence ~1995 when he had ankle surgery at the day surgery center. He says he had not been diagnosed with CPAP at that time but reportedly was apneic in recovery and required close monitoring and had prolonged PACU recovery. He did not require reintubation and had no long term complications.  Subsequent surgeries have all been under [facility] without issue.  Preop labs reviewed, WNL. Previously diabetic, but last A1c 04/05/19 was 5.2, he no longer checks sugar. BG at PAT was 99.   EKG 12/29/18: NSR. Rate 78.  Spirometry 05/13/19: (FEV1: 2.54/64%, FVC: 3.53/68%, FEV1/FVC: 72%).  Spirometry consistent with possible restrictive disease.  Overall values are improved compared to the last spirometry in October 2019.  Walk test 03/09/17: Walked 2 laps / Baseline Sat 97% on RA / Nadir Sat 97% on  RA (walked at a slow pace with a cane - stopped due to dyspnea)  TTE 02/23/17: - Left ventricle: The cavity size was normal. Wall thickness was   normal. Systolic function was normal. The estimated ejection   fraction was in the range of 60% to 65%. Doppler parameters are   consistent with abnormal left ventricular relaxation (grade 1   diastolic dysfunction). - Left atrium: The atrium was mildly dilated. - Pericardium,  extracardiac: A trivial pericardial effusion was   identified.)     Anesthesia Quick Evaluation

## 2019-05-19 NOTE — Progress Notes (Addendum)
Anesthesia Chart Review:  Pt follows with cardiology for management of CVD risk factors, no history of MI or CAD. Echo 10/18 showed normal LV size and function. Last seen by Dr. [name] 02/02/19 and cleared for surgery, "Since I saw him a year ago he is remained stable.  He did injure his neck and apparently needs laminectomy by Dr. [name] which we will clear him for a mildly elevated risk because of obesity.  LV function is normal by 2D echo performed 02/23/2017." He also commented that the pt had lost 65lbs since he had last seen him.  Follows with pulmonology for hx of asthma/COPD, OSA, O2 dependent respiratory failure. Last seen 11/11/18 and per note asthma/COPD was well controlled on current regimen. He has severe OSA (AHI 65.7) but has not been able to tolerate CPAP - considering implant by ENT. He was recommended to continue supplemental O2 2 liters with activity and at night. When pt arrived to his PAT appt on 12/23 his oxygen saturation was 95% on RA, breathing was unlabored.   Follows with allergy and asthma for asthma/COPD overlap with recurrent infections. He is maintained on prophylactic doxycycline.  Last seen 05/03/19, doing well on current regimen, spirometry was stable, discussed upcoming surgery.   Pt reports had had an episode of prolonged/difficult emergence ~1995 when he had ankle surgery at the day surgery center. He says he had not been diagnosed with CPAP at that time but reportedly was apneic in recovery and required close monitoring and had prolonged PACU recovery. He did not require reintubation and had no long term complications.  Subsequent surgeries have all been under MAC without issue.  Preop labs reviewed, WNL. Previously diabetic, but last A1c 04/05/19 was 5.2, he no longer checks sugar. BG at PAT was 99.   EKG 12/29/18: NSR. Rate 78.  Spirometry 05/13/19: (FEV1: 2.54/64%, FVC: 3.53/68%, FEV1/FVC: 72%).  Spirometry consistent with possible restrictive disease.  Overall values  are improved compared to the last spirometry in October 2019.  Walk test 03/09/17: Walked 2 laps / Baseline Sat 97% on RA / Nadir Sat 97% on RA (walked at a slow pace with a cane - stopped due to dyspnea)  TTE 02/23/17: - Left ventricle: The cavity size was normal. Wall thickness was   normal. Systolic function was normal. The estimated ejection   fraction was in the range of 60% to 65%. Doppler parameters are   consistent with abnormal left ventricular relaxation (grade 1   diastolic dysfunction). - Left atrium: The atrium was mildly dilated. - Pericardium, extracardiac: A trivial pericardial effusion was   identified.    Wynonia Musty [facility]/Anesthesiology Phone [phone] 05/19/2019 9:07 AM

## 2019-05-21 ENCOUNTER — Other Ambulatory Visit (HOSPITAL_COMMUNITY)
Admission: RE | Admit: 2019-05-21 | Discharge: 2019-05-21 | Disposition: A | Discharge status: Home / Self Care | Payer: BC Managed Care – PPO | Source: Ambulatory Visit | Attending: Specialist | Admitting: Specialist

## 2019-05-21 DIAGNOSIS — Z20828 Contact with and (suspected) exposure to other viral communicable diseases: Secondary | ICD-10-CM | POA: Insufficient documentation

## 2019-05-21 DIAGNOSIS — R069 Unspecified abnormalities of breathing: Secondary | ICD-10-CM | POA: Diagnosis not present

## 2019-05-21 DIAGNOSIS — Z01812 Encounter for preprocedural laboratory examination: Secondary | ICD-10-CM | POA: Insufficient documentation

## 2019-05-21 DIAGNOSIS — J449 Chronic obstructive pulmonary disease, unspecified: Secondary | ICD-10-CM | POA: Diagnosis not present

## 2019-05-21 DIAGNOSIS — G4733 Obstructive sleep apnea (adult) (pediatric): Secondary | ICD-10-CM | POA: Diagnosis not present

## 2019-05-21 LAB — SARS CORONAVIRUS 2 (TAT 6-24 HRS): SARS Coronavirus 2: NEGATIVE

## 2019-05-23 ENCOUNTER — Other Ambulatory Visit: Payer: Self-pay

## 2019-05-23 ENCOUNTER — Ambulatory Visit (HOSPITAL_COMMUNITY): Payer: BC Managed Care – PPO | Admitting: Physician Assistant

## 2019-05-23 ENCOUNTER — Encounter (HOSPITAL_COMMUNITY): Payer: Self-pay | Admitting: Specialist

## 2019-05-23 ENCOUNTER — Ambulatory Visit (HOSPITAL_COMMUNITY)
Admission: RE | Disposition: A | Discharge status: Home / Self Care | Payer: Self-pay | Source: Home / Self Care | Attending: Specialist

## 2019-05-23 ENCOUNTER — Ambulatory Visit (HOSPITAL_COMMUNITY): Payer: BC Managed Care – PPO

## 2019-05-23 ENCOUNTER — Observation Stay (HOSPITAL_COMMUNITY)
Admission: RE | Admit: 2019-05-23 | Discharge: 2019-05-24 | Disposition: A | Discharge status: Home / Self Care | Payer: BC Managed Care – PPO | Attending: Specialist | Admitting: Specialist

## 2019-05-23 ENCOUNTER — Ambulatory Visit (HOSPITAL_COMMUNITY): Payer: BC Managed Care – PPO | Admitting: Anesthesiology

## 2019-05-23 DIAGNOSIS — G4733 Obstructive sleep apnea (adult) (pediatric): Secondary | ICD-10-CM | POA: Diagnosis not present

## 2019-05-23 DIAGNOSIS — K219 Gastro-esophageal reflux disease without esophagitis: Secondary | ICD-10-CM | POA: Insufficient documentation

## 2019-05-23 DIAGNOSIS — I119 Hypertensive heart disease without heart failure: Secondary | ICD-10-CM | POA: Insufficient documentation

## 2019-05-23 DIAGNOSIS — M50122 Cervical disc disorder at C5-C6 level with radiculopathy: Principal | ICD-10-CM | POA: Insufficient documentation

## 2019-05-23 DIAGNOSIS — E669 Obesity, unspecified: Secondary | ICD-10-CM | POA: Insufficient documentation

## 2019-05-23 DIAGNOSIS — F419 Anxiety disorder, unspecified: Secondary | ICD-10-CM | POA: Diagnosis not present

## 2019-05-23 DIAGNOSIS — N400 Enlarged prostate without lower urinary tract symptoms: Secondary | ICD-10-CM | POA: Insufficient documentation

## 2019-05-23 DIAGNOSIS — M50123 Cervical disc disorder at C6-C7 level with radiculopathy: Secondary | ICD-10-CM | POA: Diagnosis not present

## 2019-05-23 DIAGNOSIS — J45909 Unspecified asthma, uncomplicated: Secondary | ICD-10-CM | POA: Diagnosis not present

## 2019-05-23 DIAGNOSIS — E785 Hyperlipidemia, unspecified: Secondary | ICD-10-CM | POA: Diagnosis not present

## 2019-05-23 DIAGNOSIS — R7303 Prediabetes: Secondary | ICD-10-CM | POA: Insufficient documentation

## 2019-05-23 DIAGNOSIS — Z419 Encounter for procedure for purposes other than remedying health state, unspecified: Secondary | ICD-10-CM

## 2019-05-23 DIAGNOSIS — M5000 Cervical disc disorder with myelopathy, unspecified cervical region: Secondary | ICD-10-CM | POA: Diagnosis present

## 2019-05-23 DIAGNOSIS — Z9981 Dependence on supplemental oxygen: Secondary | ICD-10-CM | POA: Diagnosis not present

## 2019-05-23 DIAGNOSIS — Z87891 Personal history of nicotine dependence: Secondary | ICD-10-CM | POA: Insufficient documentation

## 2019-05-23 DIAGNOSIS — M542 Cervicalgia: Secondary | ICD-10-CM

## 2019-05-23 DIAGNOSIS — Z981 Arthrodesis status: Secondary | ICD-10-CM

## 2019-05-23 DIAGNOSIS — M4802 Spinal stenosis, cervical region: Secondary | ICD-10-CM | POA: Diagnosis not present

## 2019-05-23 DIAGNOSIS — M47812 Spondylosis without myelopathy or radiculopathy, cervical region: Secondary | ICD-10-CM | POA: Insufficient documentation

## 2019-05-23 DIAGNOSIS — J9611 Chronic respiratory failure with hypoxia: Secondary | ICD-10-CM | POA: Diagnosis not present

## 2019-05-23 DIAGNOSIS — M4722 Other spondylosis with radiculopathy, cervical region: Secondary | ICD-10-CM | POA: Diagnosis not present

## 2019-05-23 DIAGNOSIS — E782 Mixed hyperlipidemia: Secondary | ICD-10-CM | POA: Diagnosis not present

## 2019-05-23 DIAGNOSIS — Z6841 Body Mass Index (BMI) 40.0 and over, adult: Secondary | ICD-10-CM | POA: Diagnosis not present

## 2019-05-23 DIAGNOSIS — Z7984 Long term (current) use of oral hypoglycemic drugs: Secondary | ICD-10-CM | POA: Insufficient documentation

## 2019-05-23 DIAGNOSIS — M1990 Unspecified osteoarthritis, unspecified site: Secondary | ICD-10-CM | POA: Insufficient documentation

## 2019-05-23 DIAGNOSIS — F329 Major depressive disorder, single episode, unspecified: Secondary | ICD-10-CM | POA: Insufficient documentation

## 2019-05-23 DIAGNOSIS — Z79899 Other long term (current) drug therapy: Secondary | ICD-10-CM | POA: Insufficient documentation

## 2019-05-23 DIAGNOSIS — M4322 Fusion of spine, cervical region: Secondary | ICD-10-CM | POA: Diagnosis not present

## 2019-05-23 HISTORY — PX: ANTERIOR CERVICAL DECOMP/DISCECTOMY FUSION: SHX1161

## 2019-05-23 LAB — HEMOGLOBIN A1C
Hgb A1c MFr Bld: 5.2 % (ref 4.8–5.6)
Mean Plasma Glucose: 102.54 mg/dL

## 2019-05-23 SURGERY — ANTERIOR CERVICAL DECOMPRESSION/DISCECTOMY FUSION 2 LEVELS
Anesthesia: General | Site: Spine Cervical

## 2019-05-23 MED ORDER — ACETAMINOPHEN 325 MG PO TABS: 650.0000 mg | ORAL_TABLET | ORAL | Status: DC | PRN

## 2019-05-23 MED ORDER — MENTHOL 3 MG MT LOZG
1.0000 | LOZENGE | OROMUCOSAL | Status: DC | PRN
  Administered 2019-05-24: 3 mg via ORAL
  Filled 2019-05-23: qty 9

## 2019-05-23 MED ORDER — METOLAZONE 5 MG PO TABS
5.0000 mg | ORAL_TABLET | Freq: Every day | ORAL | Status: DC | PRN
  Filled 2019-05-23: qty 1

## 2019-05-23 MED ORDER — LACTATED RINGERS IV SOLN: INTRAVENOUS | Status: DC | PRN

## 2019-05-23 MED ORDER — FENTANYL CITRATE (PF) 100 MCG/2ML IJ SOLN: 25.0000 ug | INTRAMUSCULAR | Status: DC | PRN

## 2019-05-23 MED ORDER — AZELASTINE HCL 0.1 % NA SOLN
2.0000 | Freq: Two times a day (BID) | NASAL | Status: DC
  Administered 2019-05-23 – 2019-05-24 (×2): 2 via NASAL
  Filled 2019-05-23: qty 30

## 2019-05-23 MED ORDER — ASCORBIC ACID 500 MG PO TABS: 500.0000 mg | ORAL_TABLET | Freq: Every day | ORAL | Status: DC | PRN

## 2019-05-23 MED ORDER — PHENYLEPHRINE 40 MCG/ML (10ML) SYRINGE FOR IV PUSH (FOR BLOOD PRESSURE SUPPORT)
PREFILLED_SYRINGE | INTRAVENOUS | Status: DC | PRN
  Administered 2019-05-23 (×6): 80 ug via INTRAVENOUS

## 2019-05-23 MED ORDER — MIDAZOLAM HCL 2 MG/2ML IJ SOLN
INTRAMUSCULAR | Status: AC
  Filled 2019-05-23: qty 2

## 2019-05-23 MED ORDER — FLUTICASONE PROPIONATE 50 MCG/ACT NA SUSP
1.0000 | Freq: Two times a day (BID) | NASAL | Status: DC
  Administered 2019-05-23 – 2019-05-24 (×2): 1 via NASAL
  Filled 2019-05-23: qty 16

## 2019-05-23 MED ORDER — FLUTICASONE FUROATE-VILANTEROL 100-25 MCG/INH IN AEPB
1.0000 | INHALATION_SPRAY | Freq: Every day | RESPIRATORY_TRACT | Status: DC
  Administered 2019-05-24: 1 via RESPIRATORY_TRACT
  Filled 2019-05-23: qty 28

## 2019-05-23 MED ORDER — GUAIFENESIN ER 600 MG PO TB12
1200.0000 mg | ORAL_TABLET | Freq: Two times a day (BID) | ORAL | Status: DC
  Administered 2019-05-24: 1200 mg via ORAL
  Filled 2019-05-23 (×3): qty 2

## 2019-05-23 MED ORDER — 0.9 % SODIUM CHLORIDE (POUR BTL) OPTIME
TOPICAL | Status: DC | PRN
  Administered 2019-05-23: 1000 mL

## 2019-05-23 MED ORDER — METHOCARBAMOL 500 MG PO TABS
500.0000 mg | ORAL_TABLET | Freq: Four times a day (QID) | ORAL | Status: DC | PRN
  Administered 2019-05-23 – 2019-05-24 (×3): 500 mg via ORAL
  Filled 2019-05-23 (×2): qty 1

## 2019-05-23 MED ORDER — PHENOL 1.4 % MT LIQD: 1.0000 | OROMUCOSAL | Status: DC | PRN

## 2019-05-23 MED ORDER — MEMANTINE HCL 10 MG PO TABS
20.0000 mg | ORAL_TABLET | Freq: Every day | ORAL | Status: DC
  Administered 2019-05-23: 20 mg via ORAL
  Filled 2019-05-23 (×2): qty 2

## 2019-05-23 MED ORDER — FUROSEMIDE 40 MG PO TABS: 80.0000 mg | ORAL_TABLET | Freq: Two times a day (BID) | ORAL | Status: DC | PRN

## 2019-05-23 MED ORDER — DOXYCYCLINE HYCLATE 100 MG PO TABS
100.0000 mg | ORAL_TABLET | Freq: Two times a day (BID) | ORAL | Status: DC
  Administered 2019-05-23 – 2019-05-24 (×2): 100 mg via ORAL
  Filled 2019-05-23 (×3): qty 1

## 2019-05-23 MED ORDER — ROCURONIUM BROMIDE 10 MG/ML (PF) SYRINGE
PREFILLED_SYRINGE | INTRAVENOUS | Status: AC
  Filled 2019-05-23: qty 10

## 2019-05-23 MED ORDER — THROMBIN 20000 UNITS EX SOLR: CUTANEOUS | Status: DC | PRN

## 2019-05-23 MED ORDER — PROPOFOL 10 MG/ML IV BOLUS
INTRAVENOUS | Status: AC
  Filled 2019-05-23: qty 20

## 2019-05-23 MED ORDER — OXYCODONE HCL ER 10 MG PO T12A
10.0000 mg | EXTENDED_RELEASE_TABLET | Freq: Two times a day (BID) | ORAL | Status: DC
  Administered 2019-05-23 – 2019-05-24 (×3): 10 mg via ORAL
  Filled 2019-05-23 (×3): qty 1

## 2019-05-23 MED ORDER — EPHEDRINE SULFATE 50 MG/ML IJ SOLN
INTRAMUSCULAR | Status: DC | PRN
  Administered 2019-05-23: 5 mg via INTRAVENOUS
  Administered 2019-05-23: 10 mg via INTRAVENOUS

## 2019-05-23 MED ORDER — METHOCARBAMOL 500 MG PO TABS
ORAL_TABLET | ORAL | Status: AC
  Filled 2019-05-23: qty 1

## 2019-05-23 MED ORDER — ALUM & MAG HYDROXIDE-SIMETH 200-200-20 MG/5ML PO SUSP: 30.0000 mL | Freq: Four times a day (QID) | ORAL | Status: DC | PRN

## 2019-05-23 MED ORDER — FENTANYL CITRATE (PF) 250 MCG/5ML IJ SOLN
INTRAMUSCULAR | Status: AC
  Filled 2019-05-23: qty 5

## 2019-05-23 MED ORDER — MONTELUKAST SODIUM 10 MG PO TABS
10.0000 mg | ORAL_TABLET | Freq: Every day | ORAL | Status: DC
  Administered 2019-05-23: 10 mg via ORAL
  Filled 2019-05-23 (×2): qty 1

## 2019-05-23 MED ORDER — IPRATROPIUM-ALBUTEROL 0.5-2.5 (3) MG/3ML IN SOLN: 3.0000 mL | RESPIRATORY_TRACT | Status: DC | PRN

## 2019-05-23 MED ORDER — ACETAMINOPHEN 650 MG RE SUPP: 650.0000 mg | RECTAL | Status: DC | PRN

## 2019-05-23 MED ORDER — PANTOPRAZOLE SODIUM 40 MG PO TBEC: 40.0000 mg | DELAYED_RELEASE_TABLET | Freq: Every day | ORAL | Status: DC | PRN

## 2019-05-23 MED ORDER — KRILL OIL 300 MG PO CAPS: 1000.0000 mg | ORAL_CAPSULE | Freq: Three times a day (TID) | ORAL | Status: DC

## 2019-05-23 MED ORDER — ONDANSETRON HCL 4 MG/2ML IJ SOLN
INTRAMUSCULAR | Status: DC | PRN
  Administered 2019-05-23: 4 mg via INTRAVENOUS

## 2019-05-23 MED ORDER — AZELASTINE-FLUTICASONE 137-50 MCG/ACT NA SUSP: 2.0000 | Freq: Two times a day (BID) | NASAL | Status: DC

## 2019-05-23 MED ORDER — SODIUM CHLORIDE 0.9% FLUSH
3.0000 mL | Freq: Two times a day (BID) | INTRAVENOUS | Status: DC
  Administered 2019-05-23: 3 mL via INTRAVENOUS

## 2019-05-23 MED ORDER — THROMBIN 20000 UNITS EX KIT
PACK | CUTANEOUS | Status: AC
  Filled 2019-05-23: qty 1

## 2019-05-23 MED ORDER — COQ-10 400 MG PO CAPS: 400.0000 mg | ORAL_CAPSULE | Freq: Every day | ORAL | Status: DC

## 2019-05-23 MED ORDER — OXYCODONE HCL 5 MG PO TABS
ORAL_TABLET | ORAL | Status: AC
  Filled 2019-05-23: qty 2

## 2019-05-23 MED ORDER — ONDANSETRON HCL 4 MG PO TABS: 4.0000 mg | ORAL_TABLET | Freq: Four times a day (QID) | ORAL | Status: DC | PRN

## 2019-05-23 MED ORDER — L-METHYLFOLATE-B6-B12 3-35-2 MG PO TABS: 1.0000 | ORAL_TABLET | Freq: Every day | ORAL | Status: DC

## 2019-05-23 MED ORDER — UMECLIDINIUM BROMIDE 62.5 MCG/INH IN AEPB
1.0000 | INHALATION_SPRAY | Freq: Every day | RESPIRATORY_TRACT | Status: DC
  Administered 2019-05-24: 1 via RESPIRATORY_TRACT
  Filled 2019-05-23: qty 7

## 2019-05-23 MED ORDER — DEXAMETHASONE SODIUM PHOSPHATE 10 MG/ML IJ SOLN
INTRAMUSCULAR | Status: DC | PRN
  Administered 2019-05-23: 4 mg via INTRAVENOUS

## 2019-05-23 MED ORDER — BISACODYL 5 MG PO TBEC: 5.0000 mg | DELAYED_RELEASE_TABLET | Freq: Every day | ORAL | Status: DC | PRN

## 2019-05-23 MED ORDER — BACLOFEN 10 MG PO TABS
10.0000 mg | ORAL_TABLET | Freq: Three times a day (TID) | ORAL | Status: DC | PRN
  Administered 2019-05-24: 10 mg via ORAL
  Filled 2019-05-23: qty 1

## 2019-05-23 MED ORDER — BUPIVACAINE LIPOSOME 1.3 % IJ SUSP
20.0000 mL | INTRAMUSCULAR | Status: AC
  Administered 2019-05-23: 2 mL
  Filled 2019-05-23: qty 20

## 2019-05-23 MED ORDER — FLEET ENEMA 7-19 GM/118ML RE ENEM: 1.0000 | ENEMA | Freq: Once | RECTAL | Status: DC | PRN

## 2019-05-23 MED ORDER — LORATADINE 10 MG PO TABS
10.0000 mg | ORAL_TABLET | Freq: Every day | ORAL | Status: DC
  Administered 2019-05-23 – 2019-05-24 (×2): 10 mg via ORAL
  Filled 2019-05-23 (×2): qty 1

## 2019-05-23 MED ORDER — SUCCINYLCHOLINE CHLORIDE 200 MG/10ML IV SOSY
PREFILLED_SYRINGE | INTRAVENOUS | Status: AC
  Filled 2019-05-23: qty 10

## 2019-05-23 MED ORDER — FLUTICASONE-UMECLIDIN-VILANT 100-62.5-25 MCG/INH IN AEPB: 1.0000 | INHALATION_SPRAY | Freq: Every day | RESPIRATORY_TRACT | Status: DC

## 2019-05-23 MED ORDER — ELUXADOLINE 100 MG PO TABS: 100.0000 mg | ORAL_TABLET | Freq: Two times a day (BID) | ORAL | Status: DC

## 2019-05-23 MED ORDER — SODIUM CHLORIDE 0.9% FLUSH: 3.0000 mL | INTRAVENOUS | Status: DC | PRN

## 2019-05-23 MED ORDER — CEFAZOLIN SODIUM-DEXTROSE 2-4 GM/100ML-% IV SOLN
2.0000 g | Freq: Three times a day (TID) | INTRAVENOUS | Status: AC
  Administered 2019-05-23 (×2): 2 g via INTRAVENOUS
  Filled 2019-05-23 (×2): qty 100

## 2019-05-23 MED ORDER — BISOPROLOL-HYDROCHLOROTHIAZIDE 5-6.25 MG PO TABS
1.0000 | ORAL_TABLET | Freq: Every day | ORAL | Status: DC
  Administered 2019-05-23 – 2019-05-24 (×2): 1 via ORAL
  Filled 2019-05-23 (×2): qty 1

## 2019-05-23 MED ORDER — DONEPEZIL HCL 23 MG PO TABS
23.0000 mg | ORAL_TABLET | Freq: Every day | ORAL | Status: DC
  Administered 2019-05-23: 23 mg via ORAL
  Filled 2019-05-23 (×2): qty 1

## 2019-05-23 MED ORDER — FENTANYL CITRATE (PF) 250 MCG/5ML IJ SOLN
INTRAMUSCULAR | Status: DC | PRN
  Administered 2019-05-23 (×8): 50 ug via INTRAVENOUS

## 2019-05-23 MED ORDER — SUCCINYLCHOLINE CHLORIDE 20 MG/ML IJ SOLN
INTRAMUSCULAR | Status: DC | PRN
  Administered 2019-05-23: 160 mg via INTRAVENOUS

## 2019-05-23 MED ORDER — CINNAMON 500 MG PO CAPS: 1000.0000 mg | ORAL_CAPSULE | Freq: Three times a day (TID) | ORAL | Status: DC

## 2019-05-23 MED ORDER — RENA-VITE PO TABS
1.0000 | ORAL_TABLET | Freq: Every day | ORAL | Status: DC
  Administered 2019-05-23: 1 via ORAL
  Filled 2019-05-23 (×2): qty 1

## 2019-05-23 MED ORDER — CYCLOBENZAPRINE HCL 10 MG PO TABS
10.0000 mg | ORAL_TABLET | Freq: Three times a day (TID) | ORAL | Status: DC
  Administered 2019-05-23 – 2019-05-24 (×4): 10 mg via ORAL
  Filled 2019-05-23 (×4): qty 1

## 2019-05-23 MED ORDER — LIDOCAINE 2% (20 MG/ML) 5 ML SYRINGE
INTRAMUSCULAR | Status: AC
  Filled 2019-05-23: qty 5

## 2019-05-23 MED ORDER — ALBUTEROL SULFATE (2.5 MG/3ML) 0.083% IN NEBU: 2.5000 mg | INHALATION_SOLUTION | RESPIRATORY_TRACT | Status: DC | PRN

## 2019-05-23 MED ORDER — ACETAMINOPHEN 500 MG PO TABS: 1000.0000 mg | ORAL_TABLET | Freq: Four times a day (QID) | ORAL | Status: DC | PRN

## 2019-05-23 MED ORDER — SODIUM CHLORIDE 0.9 % IV SOLN: INTRAVENOUS | Status: DC

## 2019-05-23 MED ORDER — LIDOCAINE 2% (20 MG/ML) 5 ML SYRINGE
INTRAMUSCULAR | Status: DC | PRN
  Administered 2019-05-23: 100 mg via INTRAVENOUS

## 2019-05-23 MED ORDER — PANTOPRAZOLE SODIUM 40 MG PO TBEC
40.0000 mg | DELAYED_RELEASE_TABLET | Freq: Every day | ORAL | Status: DC
  Administered 2019-05-23: 40 mg via ORAL
  Filled 2019-05-23: qty 1

## 2019-05-23 MED ORDER — CLOTRIMAZOLE-BETAMETHASONE 1-0.05 % EX CREA
1.0000 "application " | TOPICAL_CREAM | Freq: Two times a day (BID) | CUTANEOUS | Status: DC | PRN
  Filled 2019-05-23: qty 15

## 2019-05-23 MED ORDER — LUTEIN 20 MG PO CAPS: 20.0000 mg | ORAL_CAPSULE | Freq: Every day | ORAL | Status: DC

## 2019-05-23 MED ORDER — VITAMIN D 25 MCG (1000 UNIT) PO TABS
10000.0000 [IU] | ORAL_TABLET | Freq: Three times a day (TID) | ORAL | Status: DC
  Administered 2019-05-23 – 2019-05-24 (×3): 10000 [IU] via ORAL
  Filled 2019-05-23 (×3): qty 10

## 2019-05-23 MED ORDER — PANTOPRAZOLE SODIUM 40 MG IV SOLR: 40.0000 mg | Freq: Every day | INTRAVENOUS | Status: DC

## 2019-05-23 MED ORDER — PHENYLEPHRINE HCL (PRESSORS) 10 MG/ML IV SOLN
INTRAVENOUS | Status: AC
  Filled 2019-05-23: qty 2

## 2019-05-23 MED ORDER — EPINEPHRINE (ANAPHYLAXIS) 1 MG/ML IJ SOLN
0.3000 mg | Freq: Once | INTRAMUSCULAR | Status: DC | PRN
  Filled 2019-05-23: qty 0.3

## 2019-05-23 MED ORDER — DUPILUMAB 300 MG/2ML ~~LOC~~ SOSY: 300.0000 mg | PREFILLED_SYRINGE | SUBCUTANEOUS | Status: DC

## 2019-05-23 MED ORDER — ALFUZOSIN HCL ER 10 MG PO TB24
10.0000 mg | ORAL_TABLET | Freq: Every day | ORAL | Status: DC
  Administered 2019-05-23: 10 mg via ORAL
  Filled 2019-05-23 (×2): qty 1

## 2019-05-23 MED ORDER — PROPOFOL 10 MG/ML IV BOLUS
INTRAVENOUS | Status: DC | PRN
  Administered 2019-05-23: 200 mg via INTRAVENOUS

## 2019-05-23 MED ORDER — MIDAZOLAM HCL 5 MG/5ML IJ SOLN
INTRAMUSCULAR | Status: DC | PRN
  Administered 2019-05-23: 2 mg via INTRAVENOUS

## 2019-05-23 MED ORDER — DOCUSATE SODIUM 100 MG PO CAPS
100.0000 mg | ORAL_CAPSULE | Freq: Two times a day (BID) | ORAL | Status: DC
  Administered 2019-05-23 – 2019-05-24 (×3): 100 mg via ORAL
  Filled 2019-05-23 (×3): qty 1

## 2019-05-23 MED ORDER — ANASTROZOLE 1 MG PO TABS
1.0000 mg | ORAL_TABLET | Freq: Every day | ORAL | Status: DC
  Administered 2019-05-24: 1 mg via ORAL
  Filled 2019-05-23: qty 1

## 2019-05-23 MED ORDER — HEMOSTATIC AGENTS (NO CHARGE) OPTIME
TOPICAL | Status: DC | PRN
  Administered 2019-05-23: 1

## 2019-05-23 MED ORDER — OXYCODONE HCL 5 MG PO TABS: 5.0000 mg | ORAL_TABLET | ORAL | Status: DC | PRN

## 2019-05-23 MED ORDER — METHOCARBAMOL 1000 MG/10ML IJ SOLN
500.0000 mg | Freq: Four times a day (QID) | INTRAVENOUS | Status: DC | PRN
  Filled 2019-05-23: qty 5

## 2019-05-23 MED ORDER — MORPHINE SULFATE (PF) 2 MG/ML IV SOLN: 1.0000 mg | INTRAVENOUS | Status: DC | PRN

## 2019-05-23 MED ORDER — OXYCODONE HCL 5 MG PO TABS
10.0000 mg | ORAL_TABLET | ORAL | Status: DC | PRN
  Administered 2019-05-23 – 2019-05-24 (×8): 10 mg via ORAL
  Filled 2019-05-23 (×7): qty 2

## 2019-05-23 MED ORDER — PROPOFOL 500 MG/50ML IV EMUL
INTRAVENOUS | Status: DC | PRN
  Administered 2019-05-23: 25 ug/kg/min via INTRAVENOUS

## 2019-05-23 MED ORDER — ONDANSETRON HCL 4 MG/2ML IJ SOLN: 4.0000 mg | Freq: Four times a day (QID) | INTRAMUSCULAR | Status: DC | PRN

## 2019-05-23 MED ORDER — PREGABALIN 75 MG PO CAPS
150.0000 mg | ORAL_CAPSULE | Freq: Three times a day (TID) | ORAL | Status: DC
  Administered 2019-05-23 – 2019-05-24 (×4): 150 mg via ORAL
  Filled 2019-05-23 (×4): qty 2

## 2019-05-23 MED ORDER — PHENYLEPHRINE HCL-NACL 10-0.9 MG/250ML-% IV SOLN
INTRAVENOUS | Status: DC | PRN
  Administered 2019-05-23: 50 ug/min via INTRAVENOUS

## 2019-05-23 MED ORDER — MAGNESIUM OXIDE 400 (241.3 MG) MG PO TABS
400.0000 mg | ORAL_TABLET | Freq: Every day | ORAL | Status: DC
  Administered 2019-05-23: 400 mg via ORAL
  Filled 2019-05-23: qty 1

## 2019-05-23 MED ORDER — CHLORHEXIDINE GLUCONATE 4 % EX LIQD: 60.0000 mL | Freq: Once | CUTANEOUS | Status: DC

## 2019-05-23 MED ORDER — BUPIVACAINE HCL (PF) 0.5 % IJ SOLN
INTRAMUSCULAR | Status: AC
  Filled 2019-05-23: qty 30

## 2019-05-23 MED ORDER — POLYETHYLENE GLYCOL 3350 17 G PO PACK: 17.0000 g | PACK | Freq: Every day | ORAL | Status: DC | PRN

## 2019-05-23 MED ORDER — BUPIVACAINE HCL 0.5 % IJ SOLN
INTRAMUSCULAR | Status: DC | PRN
  Administered 2019-05-23: 2 mL

## 2019-05-23 SURGICAL SUPPLY — 73 items
ADH SKN CLS APL DERMABOND .7 (GAUZE/BANDAGES/DRESSINGS) ×1
AGENT HMST KT MTR STRL THRMB (HEMOSTASIS) ×1
APL SKNCLS STERI-STRIP NONHPOA (GAUZE/BANDAGES/DRESSINGS) ×1
BENZOIN TINCTURE PRP APPL 2/3 (GAUZE/BANDAGES/DRESSINGS) ×2 IMPLANT
BIT DRILL SPINE QC 14 (BIT) ×1 IMPLANT
BLADE CLIPPER SURG (BLADE) IMPLANT
BUR MATCHSTICK NEURO 3.0 LAGG (BURR) IMPLANT
BUR RND FLUTED 2.5 (BURR) ×1 IMPLANT
CLSR STERI-STRIP ANTIMIC 1/2X4 (GAUZE/BANDAGES/DRESSINGS) ×1 IMPLANT
COVER SURGICAL LIGHT HANDLE (MISCELLANEOUS) ×2 IMPLANT
COVER WAND RF STERILE (DRAPES) ×2 IMPLANT
DERMABOND ADVANCED (GAUZE/BANDAGES/DRESSINGS) ×1
DERMABOND ADVANCED .7 DNX12 (GAUZE/BANDAGES/DRESSINGS) ×1 IMPLANT
DRAIN TLS ROUND 10FR (DRAIN) IMPLANT
DRAPE C-ARM 42X72 X-RAY (DRAPES) ×2 IMPLANT
DRAPE MICROSCOPE LEICA (MISCELLANEOUS) ×2 IMPLANT
DRAPE POUCH INSTRU U-SHP 10X18 (DRAPES) ×2 IMPLANT
DRAPE SURG 17X23 STRL (DRAPES) ×18 IMPLANT
DRSG MEPILEX BORDER 4X4 (GAUZE/BANDAGES/DRESSINGS) ×1 IMPLANT
DURAPREP 6ML APPLICATOR 50/CS (WOUND CARE) ×2 IMPLANT
ELECT BLADE 4.0 EZ CLEAN MEGAD (MISCELLANEOUS)
ELECT COATED BLADE 2.86 ST (ELECTRODE) ×2 IMPLANT
ELECT REM PT RETURN 9FT ADLT (ELECTROSURGICAL) ×2
ELECTRODE BLDE 4.0 EZ CLN MEGD (MISCELLANEOUS) IMPLANT
ELECTRODE REM PT RTRN 9FT ADLT (ELECTROSURGICAL) ×1 IMPLANT
FEE INTRAOP MONITOR IMPULS NCS (MISCELLANEOUS) IMPLANT
GLOVE BIOGEL PI IND STRL 8 (GLOVE) ×1 IMPLANT
GLOVE BIOGEL PI INDICATOR 8 (GLOVE) ×1
GLOVE ECLIPSE 9.0 STRL (GLOVE) ×2 IMPLANT
GLOVE ORTHO TXT STRL SZ7.5 (GLOVE) ×2 IMPLANT
GLOVE SURG 8.5 LATEX PF (GLOVE) ×2 IMPLANT
GOWN STRL REUS W/ TWL LRG LVL3 (GOWN DISPOSABLE) ×1 IMPLANT
GOWN STRL REUS W/TWL 2XL LVL3 (GOWN DISPOSABLE) ×4 IMPLANT
GOWN STRL REUS W/TWL LRG LVL3 (GOWN DISPOSABLE) ×2
HALTER HD/CHIN CERV TRACTION D (MISCELLANEOUS) ×2 IMPLANT
INTRAOP MONITOR FEE IMPULS NCS (MISCELLANEOUS) ×1
INTRAOP MONITOR FEE IMPULSE (MISCELLANEOUS) ×1
KIT BASIN OR (CUSTOM PROCEDURE TRAY) ×2 IMPLANT
KIT TURNOVER KIT B (KITS) ×2 IMPLANT
MANIFOLD NEPTUNE II (INSTRUMENTS) ×2 IMPLANT
NDL SPNL 20GX3.5 QUINCKE YW (NEEDLE) ×2 IMPLANT
NEEDLE SPNL 20GX3.5 QUINCKE YW (NEEDLE) ×6 IMPLANT
NS IRRIG 1000ML POUR BTL (IV SOLUTION) ×2 IMPLANT
PACK ORTHO CERVICAL (CUSTOM PROCEDURE TRAY) ×2 IMPLANT
PAD ARMBOARD 7.5X6 YLW CONV (MISCELLANEOUS) ×4 IMPLANT
PATTIES SURGICAL .5 X.5 (GAUZE/BANDAGES/DRESSINGS) IMPLANT
PIN DISTRACTION 14MM (PIN) IMPLANT
PIN DISTRATION 14MM (PIN) ×2 IMPLANT
PLATE ANT CERV XTEND 2 LV 36 (Plate) ×1 IMPLANT
RESTRAINT LIMB HOLDER UNIV (RESTRAINTS) ×2 IMPLANT
SCREW XTD VAR 4.2 SELF TAP (Screw) ×5 IMPLANT
SCREW XTEND SELFTAP VAR 4.6X14 (Screw) ×1 IMPLANT
SPACER CERV 11X14 10 5D (Spacer) ×1 IMPLANT
SPACER CERVICAL 11X14MM 9MM 5D (Spacer) ×1 IMPLANT
SPONGE INTESTINAL PEANUT (DISPOSABLE) IMPLANT
SPONGE LAP 4X18 RFD (DISPOSABLE) IMPLANT
SPONGE SURGIFOAM ABS GEL 100 (HEMOSTASIS) ×1 IMPLANT
STRIP CLOSURE SKIN 1/2X4 (GAUZE/BANDAGES/DRESSINGS) ×2 IMPLANT
SURGIFLO W/THROMBIN 8M KIT (HEMOSTASIS) ×1 IMPLANT
SUT ETHILON 2 0 PSLX (SUTURE) ×1 IMPLANT
SUT ETHILON 4 0 PS 2 18 (SUTURE) ×2 IMPLANT
SUT VIC AB 2-0 CT1 27 (SUTURE) ×2
SUT VIC AB 2-0 CT1 TAPERPNT 27 (SUTURE) ×1 IMPLANT
SUT VIC AB 3-0 X1 27 (SUTURE) ×2 IMPLANT
SUT VICRYL 4-0 PS2 18IN ABS (SUTURE) IMPLANT
SYR 20ML LL LF (SYRINGE) ×2 IMPLANT
SYR 30ML LL (SYRINGE) ×2 IMPLANT
SYSTEM CHEST DRAIN TLS 7FR (DRAIN) IMPLANT
TOWEL GREEN STERILE (TOWEL DISPOSABLE) ×2 IMPLANT
TOWEL GREEN STERILE FF (TOWEL DISPOSABLE) ×2 IMPLANT
TRAY FOLEY MTR SLVR 16FR STAT (SET/KITS/TRAYS/PACK) ×1 IMPLANT
TRAY FOLEY W/BAG SLVR 14FR (SET/KITS/TRAYS/PACK) ×2 IMPLANT
WATER STERILE IRR 1000ML POUR (IV SOLUTION) ×1 IMPLANT

## 2019-05-23 NOTE — Progress Notes (Signed)
PHARMACIST - PHYSICIAN ORDER COMMUNICATION  CONCERNING: P&T Medication Policy on Herbal Medications  DESCRIPTION:  This patient's order for:  Cinnamon 1000 mg, CoQ-10 400 mg, and Krill oil 900 mg has been noted.  This product(s) is classified as an "herbal" or natural product. Due to a lack of definitive safety studies or FDA approval, nonstandard manufacturing practices, plus the potential risk of unknown drug-drug interactions while on inpatient medications, the Pharmacy and Therapeutics Committee does not permit the use of "herbal" or natural products of this type within [facility].   ACTION TAKEN: The pharmacy department is unable to verify this order at this time and your patient has been informed of this safety policy. Please reevaluate patient's clinical condition at discharge and address if the herbal or natural product(s) should be resumed at that time.

## 2019-05-23 NOTE — Progress Notes (Signed)
Pt. States he is not diabetic, but on metformin for prophylactic measures. Pt. Refused for [place] to check a blood sugar prior to surgery.

## 2019-05-23 NOTE — Interval H&P Note (Signed)
History and Physical Interval Note:  05/23/2019 7:38 AM  Mark Harrell  has presented today for surgery, with the diagnosis of cervical stenosis C5-6, cervical spondylosis C6-7.  The various methods of treatment have been discussed with the patient and family. After consideration of risks, benefits and other options for treatment, the patient has consented to  Procedure(s): ANTERIOR CERVICAL DISCECTOMY FUSION C5-6 AND C6-7 (N/A) as a surgical intervention.  The patient's history has been reviewed, patient examined, no change in status, stable for surgery.  I have reviewed the patient's chart and labs.  Questions were answered to the patient's satisfaction.     [name]

## 2019-05-23 NOTE — Transfer of Care (Signed)
Immediate Anesthesia Transfer of Care Note  Patient: Mark Harrell  Procedure(s) Performed: ANTERIOR CERVICAL DISCECTOMY FUSION C5ERVICAL FIVE THROUGH CERVICAL SIX AND CERVICAL SIX THROUGH CERVICAL SEVEN (N/A Spine Cervical)  Patient Location: PACU  Anesthesia Type:General  Level of Consciousness: awake, alert  and oriented  Airway & Oxygen Therapy: Patient Spontanous Breathing and Patient connected to face mask oxygen  Post-op Assessment: Report given to RN, Post -op Vital signs reviewed and stable and Patient moving all extremities  Post vital signs: Reviewed and stable  Last Vitals:  Vitals Value Taken Time  BP 160/101 05/23/19 1231  Temp 36.9 C 05/23/19 1230  Pulse 83 05/23/19 1235  Resp 15 05/23/19 1235  SpO2 98 % 05/23/19 1235  Vitals shown include unvalidated device data.  Last Pain:  Vitals:   05/23/19 1230  TempSrc:   PainSc: (P) Asleep         Complications: No apparent anesthesia complications

## 2019-05-23 NOTE — Progress Notes (Signed)
Patient refused CPAP for the night. RT will monitor as needed throughout the night. 

## 2019-05-23 NOTE — Anesthesia Postprocedure Evaluation (Signed)
Anesthesia Post Note  Patient: TYELOR PARRALES  Procedure(s) Performed: ANTERIOR CERVICAL DISCECTOMY FUSION CERVICAL FIVE THROUGH CERVICAL SIX AND CERVICAL SIX THROUGH CERVICAL SEVEN (N/A Spine Cervical)     Patient location during evaluation: PACU Anesthesia Type: General Level of consciousness: awake and alert Pain management: pain level controlled Vital Signs Assessment: post-procedure vital signs reviewed and stable Respiratory status: spontaneous breathing, nonlabored ventilation and respiratory function stable Cardiovascular status: blood pressure returned to baseline and stable Postop Assessment: no apparent nausea or vomiting Anesthetic complications: no    Last Vitals:  Vitals:   05/23/19 1245 05/23/19 1340  BP: (!) 168/90 (!) 174/83  Pulse: 88 83  Resp: 11 15  Temp:    SpO2: 96% 94%    Last Pain:  Vitals:   05/23/19 1340  TempSrc:   PainSc: Carnegie Karmin Kasprzak

## 2019-05-23 NOTE — Anesthesia Procedure Notes (Signed)
Procedure Name: Intubation Date/Time: 05/23/2019 7:52 AM Performed by: Amadeo Garnet, CRNA Pre-anesthesia Checklist: Patient identified, Emergency Drugs available, Suction available and Patient being monitored Patient Re-evaluated:Patient Re-evaluated prior to induction Oxygen Delivery Method: Circle system utilized Preoxygenation: Pre-oxygenation with 100% oxygen Induction Type: IV induction and Rapid sequence Laryngoscope Size: Glidescope and 3 Grade View: Grade I Tube type: Oral Number of attempts: 1 Airway Equipment and Method: Stylet and Video-laryngoscopy Placement Confirmation: ETT inserted through vocal cords under direct vision,  positive ETCO2 and breath sounds checked- equal and bilateral Secured at: 23 cm Tube secured with: Tape Dental Injury: Teeth and Oropharynx as per pre-operative assessment

## 2019-05-23 NOTE — Op Note (Signed)
05/23/2019  12:27 PM  PATIENT:  Mark Harrell  58 y.o. male  MRN: [id number]  OPERATIVE REPORT  PRE-OPERATIVE DIAGNOSIS:  cervical stenosis C5-6, cervical spondylosis C6-7  POST-OPERATIVE DIAGNOSIS:  cervical stenosis C5-6, cervical spondylosis C6-7  PROCEDURE:  Procedure(s): ANTERIOR CERVICAL DISCECTOMY FUSION C5ERVICAL FIVE THROUGH CERVICAL SIX AND CERVICAL SIX THROUGH CERVICAL SEVEN    SURGEON:  Jessy Oto, MD     ASSISTANT:  Benjiman Core, PA-C  (Present throughout the entire procedure and necessary for completion of procedure in a timely manner)     ANESTHESIA:  General,    COMPLICATIONS:  None.     COMPONENTS:   Implant Name Type Inv. Item Serial No. Manufacturer Lot No. LRB No. Used Action  SPACER CERVICAL 11X14MM 9MM 5D - [id number] Spacer SPACER CERVICAL 11X14MM 9MM 5D [id number] [employer]  N/A 1 Implanted  PLATE ANT CERV XTEND 2 LV 36 - [id number] Plate PLATE ANT CERV XTEND 2 LV 36  [employer]  N/A 1 Implanted  SCREW XTD VAR 4.2 SELF TAP - [id number] Screw SCREW XTD VAR 4.2 SELF TAP  [employer]  N/A 5 Implanted  SCREW XTEND SELFTAP VAR 4.6X14 - [id number] Screw SCREW XTEND SELFTAP VAR 4.6X14  [employer]  N/A 1 Implanted    PROCEDURE:The patient was met in the holding area, and the appropriate Left cervical C5-6 and C6-7 level identified and marked with an "X" and my initials. The patient was then transported to OR and was placed on the operative table in a supine position. The patient was then placed under  general anesthesia without difficulty. The patient received appropriate preoperative antibiotic prophylaxis.  . Nursing staff inserted a Foley catheter under sterile conditionsCervical spine was positioned with a [place]field horseshoe and 5 pound cervical halter traction. All pressure points well padded and semi-beach chair position. Arm holder both arms. Standard prep with DuraPrep solution the anterior cervical spine chest. Draped in the usual manner.  Iodine vi drape was used. Standard timeout protocol was carried out identifying the patient procedure side of the procedure and level. Intra op neuromonitoring performed with both SEP and MEP. The skin the left neck was infiltrated with Marcaine with epinephrine total of 7 cc. This at the level of expected C6 incision and also along the  Lines of Langer. Incision transverse at the C6 level and carried down to the level of the platysma and then medial to sternocleidomastoid muscle. The omohyoid muscle was divided to allow for adequate exposure of the anterior cervical spine area. The interval between the trachea and esophagus medially and the carotid sheath laterally was developed as a Metzenbaum scissors and blunt dissection exposing the anterior aspect of cervical spine at the C5-6 level Attention then turned to the C5-6 and C6-7 levels where an 18-gauge spinal needles were placed with sheath intact allowing only a centimeter to extend into the C5-6 and C6-7 to and observed on lateral xray at the C6-7 and anterior aspect of C6 level. Handheld Cloward retraction of the soft tissues while identifying the level at C5-6 and C6-7 removing a portion of the anterior aspect of the discs with15 blade scalpel and pituitary. Medial border of the longus collie muscles was carefully elevated bilaterally and self-retaining retractors were introduced the foot of the blade beneath the medial border of the longus colli muscles. Soft tissue overlying the anterior borders of the disc space at C5-6 and C6-7 level carefully debridement of soft tissue back to bony edges. The anterior lip osteophytes were  then resected using rongeur. This bone was preserved for later bone grafting purposes.Distraction pins placed first at the C6-7 level.The C6-7 disc space was then first prepared using loupe magnification and headlight with resection of degenerative disc annulus anteriorly and posteriorly and cartilaginous endplates using micro-curettes  pituitaries and a high-speed bur. The operating room microscope was draped sterilely and brought into the field the C-arm previously had been draped sterilely prior to its use. Under the operating room microscope and posterior aspect of the disc was excised using micro-curettes pituitary rongeur and times per. Posterior lip osteophytes were drilled using a high-speed bur and a carefully resected with 1 and 2 mm Kerrison foraminotomy performed over both C7 nerve roots using 1 and 2 mm Kerrisons disc herniation material was noted centrally and into both foramen some of which represented reflected portion of the patient's annulus into the neuroforamen. Following decompression of the spinal cord at both C7nerve roots irrigation was carried out. The endplates of the inferior aspect of C6 and superior aspect of C7 were carefully prepared using a high-speed bur to parallel. The disc space was then sounded utilized and a precontoured lordotic sounder for the tranzgraft.  Sounding was carried up to a 10 mm implant.  10 mm spacer was felt to provide best fit for the C6-7 disc space. The 10 mm acdf autograft implant was then brought onto the field. It was then packed with local bone graft that had been harvested previously. The implant was then carefully placed over the intervertebral disc space at C6-7 level. Care taken to ensure that no bone or soft tissue debris within the disc space that could be retropulsed with insertion of  bone graft. Thegraft   was then impacted into place with the head placed in longitudinal cervical traction.  The graft was felt to be in excellent position alignment. Distraction pin at the C7 removed. Attention then turned to the C5-6. The Boss McCollough retractor replaced at the C5-6 level. The foot of the blades medial to the longus colli muscles. A distraction screw post placed at the C5 level and distraction of the disc space performed.The C5-6 disc space was then first prepared [place] with  resection of degenerative disc annulus anteriorly and posteriorly and cartilaginous endplates using micro-curettes pituitaries and a high-speed bur. The operating room microscope  was  used. Under the operating room microscope and posterior aspect of the disc was excised using micro-curettes pituitary rongeur and times per. Posterior lip osteophytes were drilled using a high-speed bur and a carefully resected with 1 and 2 mm Kerrison foraminotomy performed over both C6 nerve roots using 1 and 2 mm Kerrisons disc herniation material was noted centrally and into the right foramen some of which represented reflected portion of the patient's annulus into the neuroforamen. Additionally there was significant spur into the right and left side C6 neuroforamen affecting the right and left C6 nerve. Following decompression of the spinal cord at both C6 nerve roots, irrigation was carried out. Bleeding controlled using some bone wax excess bone wax removed and Gelfoam thrombin-soaked. The endplates of the inferior aspect of C5 and superior aspect of C6 were carefully prepared using a high-speed bur to parallel. The disc space was then sounded utilized and a precontoured lordotic sounder for the tranzgraft.  Sounding was carried up to a 9 mm implant.  9 mm spacer was felt to provide best fit for the C5-6 disc space. The 9 mm ACDF allograft implant was then brought onto the field. It  was then packed with local bone graft that had been harvested previously. The implant was then carefully placed over the intervertebral disc space at C5-6 level. Care taken to ensure that no bone or soft tissue debris within the disc space that could be retropulsed with insertion of the bone graft. The graft was then impacted into place with the head placed in longitudinal cervical traction.  The graft was felt to be in excellent position alignment. Distraction pins at the C5 and C6 removed. This point irrigation was carried out cervical incision  site and lower cervical incision site. Esophagus examined and the upper cervical level as well as at the lower cervical level and found to be normal. Irrigation was again carried out there was no active bleeding present.Longitudinal halter traction was discontinued. The length for cervical plate determined with cottonoid string and two bayonet forceps. Anterior lip osteophytes were then resected about the C5-6 and C6-7 levels using a high-speed bur. This area smoothed for application of the plate to the anterior surface of the cervical spine from C5-C7 A 36 mm plate chosen. The plate carefully applied to the anterior cervical spine and aligned. A single retaining screw placed at the left C6 level. Drilling 14 mm hole left side at the C5 level and a 14 mm screw placed. The retaining pin was then removed and a drill hole made on the right side at C6 to 14 mm and a 14 mm screw placed. A second screw then placed on the right side at the C5 level and on the left at the C6 level. Then both sides at the C7 level drilled to 14 mm and 14 mm screws placed. Total of 6 14 mm screws were placed each locked within the plate quite nicely a single revision screw used on the right side at C6. Intraoperative lateral radiograph of the cervical spine demonstrate plates and screws and bone grafts to be in good position alignment without any sign of canal impingement or retropulsion of graft. Irrigation was carried out at cervical incision site and lower cervical incision site. Esophagus examined and the upper cervical level as well as at the lower cervical level and found to be normal.  A 7 [place] TLS drain then place exiting out the anterior cervical area, sewn in place with 2-0 nylon. The omohyoid muscle reapproximated with 2-0 Vicryl The incisions were then closed by approximating the deep subcutaneous layers the platysma layer with interrupted 2-0 Vicryl suture and the superficial fascia overlying the sternocleidomastoid muscle  with interrupted 3-0 Vicryl sutures. The subcutaneous layers were approximated with interrupted 3-0 Vicryl sutures as were the superficial layers. The skin was closed with a running subcutaneous stitch of 4-0 Vicryl at  the operative C5-7 transverse incision. Skin was approximated with Dermabond. Mepilex bandage was applied. A [place] collar was then applied to the cervical spine. drapes were removed. Patient's or table was then returned to the flat position. At the end of the cervical spine surgery case all instrument and sponge counts were correct. Patient was then reactivated extubated and returned to recovery room in satisfactory condition. Intraop neuromonitoring was performed during the case and there were no changes intraoperatively in either motor or sensory evoked potentials.  Benjiman Core PA-C perform the duties of assistant surgeon performing careful retraction of the esophagus and trachea during the initial exposure and careful suctioning of neural elements cervical nerve roots and cervical cord. He was present from the beginning of case to the end of the case and  assisted in positioning of the patient in transfer the patient from bed to the stretcher at the end of the case. He closed the incision on the platysma layer to the skin. Applied dressing.   [name] 05/23/2019, 12:27 PM

## 2019-05-23 NOTE — Brief Op Note (Signed)
05/23/2019  12:24 PM  PATIENT:  Mark Harrell  58 y.o. male  PRE-OPERATIVE DIAGNOSIS:  cervical stenosis C5-6, cervical spondylosis C6-7  POST-OPERATIVE DIAGNOSIS:  cervical stenosis C5-6, cervical spondylosis C6-7  PROCEDURE:  Procedure(s): ANTERIOR CERVICAL DISCECTOMY FUSION C5ERVICAL FIVE THROUGH CERVICAL SIX AND CERVICAL SIX THROUGH CERVICAL SEVEN (N/A)  SURGEON:  Surgeon(s) and Role:    * Jessy Oto, MD - Primary  PHYSICIAN ASSISTANT: Benjiman Core, PA-C  ANESTHESIA:   local and general , Dr. Belinda Block  EBL:  500 mL   BLOOD ADMINISTERED:none  DRAINS: Urinary Catheter (Foley) and (7 french ) TLS Drain(s) to suction in the anterior neck   LOCAL MEDICATIONS USED:  MARCAINE 0.5% 1:1 EXPAREL 1.3% Amount: 7 ml  SPECIMEN:  No Specimen  DISPOSITION OF SPECIMEN:  N/A  COUNTS:  YES  TOURNIQUET:  * No tourniquets in log *  DICTATION: .Dragon Dictation  PLAN OF CARE: Admit for overnight observation  PATIENT DISPOSITION:  PACU - hemodynamically stable.   Delay start of Pharmacological VTE agent (>24hrs) due to surgical blood loss or risk of bleeding: yes

## 2019-05-23 NOTE — Progress Notes (Signed)
Orthopedic Tech Progress Note Patient Details:  [name] Oct 18, 1960 [id number] RN said patient has collar  Patient ID: Mark Harrell, male   DOB: [birth year], 58 y.o.   MRN: [id number]   [name] 05/23/2019, 3:11 PM

## 2019-05-23 NOTE — H&P (Signed)
Mark Harrell is an 58 y.o. male.   Chief Complaint: neck pain and left UE radiculopathy HPI: 58 year old white male with history of C5-6 and C6-7 HNP/stenosis, neck pain and left upper extremity radiculopathy comes in for preop evaluation.  He continues have ongoing symptoms that are unchanged from last office visit.  He is want to proceed with C5-6 and C6-7 ACDF as scheduled.  We received preop cardiac clearance.  Patient was cleared by pulmonology earlier this year but nothing recent.  Patient has a history of obstructive sleep apnea but admits to not using his CPAP.  He is currently on O2 2 L.  Today history and physical performed.  Patient admits to exertional dyspnea.  No complaints of chest pain.  We will proceed with surgery as scheduled.  I did ask our surgery scheduler to see if we can get a letter from his pulmonologist.  I instructed patient to make sure he brings his CPAP mask with him to the hospital and I encouraged him to use it postop.  I advised him that he should also be using it as directed by pulmonology.  Past Medical History:  Diagnosis Date  . Anesthesia complication requiring reversal agent administration    ? from central apnea, very difficult to get off vent  . Anxiety   . Arthritis    osteo  . Asthma   . BPH (benign prostatic hyperplasia)   . Complication of anesthesia    difficulty waking , they twlight me because of my respiratory problems "  . Depression   . Dyspnea    on exertion  . Enlarged heart   . Family history of adverse reaction to anesthesia    mother trouble waking up, and heart stopped  . GERD (gastroesophageal reflux disease)   . Headache    botox injections for headaches  . Hyperlipidemia   . Hypertension   . Hypogonadism male   . IBS (irritable bowel syndrome)   . Memory difficulties    short term memories  . Obesity   . On home oxygen therapy    on 2 liter  . OSA (obstructive sleep apnea)    not using cpap  . Pneumonia   . Pre-diabetes    . Prostatitis     Past Surgical History:  Procedure Laterality Date  . ABDOMINAL SURGERY    . ANKLE FRACTURE SURGERY Right   . COLONOSCOPY    . CYSTOSCOPY     [place]  . KNEE ARTHROSCOPY WITH MEDIAL MENISECTOMY Left 01/02/2017   Procedure: LEFT KNEE ARTHROSCOPY WITH PARTIAL MEDIAL MENISCECTOMY;  Surgeon: Mcarthur Rossetti, MD;  Location: [facility];  Service: Orthopedics;  Laterality: Left;  . TONSILLECTOMY    . [place] RESECTION  2007  . UVULOPALATOPHARYNGOPLASTY      Family History  Problem Relation Age of Onset  . Diabetes Paternal Uncle   . Cancer Father        lymphoma, colon  . Diabetes Maternal Grandmother   . Heart disease Maternal Grandfather   . Diabetes Maternal Grandfather   . Diabetes Paternal Grandmother   . Diabetes Paternal Grandfather   . Dementia Mother   . Prostate cancer Maternal Uncle   . Lung disease Neg Hx   . Rheumatologic disease Neg Hx    Social History:  reports that he has quit smoking. He has a 1.50 pack-year smoking history. He has never used smokeless tobacco. He reports current alcohol use of about 1.0 standard drinks of alcohol per week. He  reports that he does not use drugs.  Allergies:  Allergies  Allergen Reactions  . Bee Venom Swelling  . Duloxetine Shortness Of Breath    Brought on asthma  . Ppd [Tuberculin Purified Protein Derivative] Other (See Comments)    +ppd NEG Quantferron Gold 3/13 (shows false positive)   . Fenofibrate Other (See Comments)    Back pain  . Verapamil Other (See Comments)    Back pain  . Claritin [Loratadine] Other (See Comments)    UNKNOWN   . Levofloxacin Diarrhea  . Other Diarrhea    "Some antibiotics" cause diarrhea    Medications Prior to Admission  Medication Sig Dispense Refill  . acetaminophen (TYLENOL) 500 MG tablet Take 1,000 mg by mouth every 6 (six) hours as needed for moderate pain.    [name] albuterol (PROVENTIL HFA;VENTOLIN HFA) 108 (90 Base) MCG/ACT inhaler 1 to 2 inhalations 10 to  15 minutes apart every 4 hrs to rescue Asthma (Patient taking differently: Inhale 1-2 puffs into the lungs every 4 (four) hours as needed for wheezing or shortness of breath. ) 60 g 1  . alfuzosin (UROXATRAL) 10 MG 24 hr tablet Take 10 mg by mouth at bedtime.     [name] anastrozole (ARIMIDEX) 1 MG tablet Take 1 mg by mouth daily.     [name] ascorbic acid (VITAMIN C) 500 MG tablet Take 500 mg by mouth daily as needed (immune support).     [name] azelastine (ASTELIN) 0.1 % nasal spray Use 1 to 2 sprays  Each Nostril 2 x /day (Patient taking differently: Place 2 sprays into both nostrils 2 (two) times daily. ) 90 mL 3  . baclofen (LIORESAL) 10 MG tablet Take 10 mg by mouth 3 (three) times daily as needed for muscle spasms.     . bisoprolol-hydrochlorothiazide (ZIAC) 5-6.25 MG tablet Take 1 tablet Daily for BP (Patient taking differently: Take 1 tablet by mouth daily. ) 90 tablet 3  . Botulinum Toxin Type A 200 units SOLR Inject 1 each into the skin every 3 (three) months.     . cetaphil (CETAPHIL) lotion Apply topically 2 (two) times daily. (Patient taking differently: Apply 1 application topically 2 (two) times daily. ) 236 mL 0  . cetirizine (ZYRTEC) 10 MG tablet Take 10 mg by mouth daily.    . Cholecalciferol (VITAMIN D3) 250 MCG (10000 UT) capsule Take 10,000 Units by mouth 3 (three) times daily.    [name] CINNAMON PO Take 1,000 mg by mouth 3 (three) times daily.    . clotrimazole-betamethasone (LOTRISONE) cream APPLY TO AFFECTED AREA TWICE A DAY (Patient taking differently: Apply 1 application topically 2 (two) times daily as needed (rash). ) 15 g 2  . Coenzyme Q10 (COQ-10) 400 MG CAPS Take 400 mg by mouth daily.    . cyclobenzaprine (FLEXERIL) 10 MG tablet Take 10 mg by mouth 3 (three) times daily.     . diclofenac sodium (VOLTAREN) 1 % GEL Apply 2 to 4 grams Topically 2 to 4 x /day for Pain & Inflammation (Patient taking differently: Apply 2-4 g topically 4 (four) times daily as needed (pain). ) 300 g 3  . donepezil  (ARICEPT) 23 MG TABS tablet Take 23 mg by mouth at bedtime.    [name] doxycycline (VIBRAMYCIN) 100 MG capsule TAKE 1 CAPSULE BY MOUTH TWICE A DAY (Patient taking differently: Take 100 mg by mouth 2 (two) times daily. ) 60 capsule 3  . DUPIXENT 300 MG/2ML SOSY RX1: INJECT 2 SYRINGES UNDER THE SKIN (SUBCUTANEOUS  INJECTION) ON DAY 1 , THEN 1 SYRINGE ON DAY 15 AND EVERY OTHER WEEK THEREAFTER (Patient taking differently: Inject 300 mg into the skin every 14 (fourteen) days. ) 4 mL 11  . Eluxadoline (VIBERZI) 100 MG TABS Take 100 mg by mouth 2 (two) times daily.     . fluticasone (FLONASE) 50 MCG/ACT nasal spray Use 1 to 2 sprays each Nostril 2 x /day (Patient taking differently: Place 2 sprays into both nostrils 2 (two) times daily. ) 48 g 3  . Fluticasone-Umeclidin-Vilant (TRELEGY ELLIPTA) 100-62.5-25 MCG/INH AEPB Inhale 1 puff into the lungs daily. 28 each 2  . furosemide (LASIX) 80 MG tablet TAKE 1 TO 1 & 1/2 TABLETS 2 X /DAY FOR FLUID RETENTION & SWELLING OF LEGS (Patient taking differently: Take 80-120 mg by mouth 2 (two) times daily as needed for edema. ) 270 tablet 1  . Guaifenesin (MUCINEX MAXIMUM STRENGTH) 1200 MG TB12 Take 1,200 mg by mouth 2 (two) times daily.    [name] HYDROcodone-acetaminophen (NORCO) 7.5-325 MG tablet Take 1 tablet by mouth every 6 (six) hours as needed for moderate pain. 30 tablet 0  . ipratropium-albuterol (DUONEB) 0.5-2.5 (3) MG/3ML SOLN Take 3 mLs by nebulization every 4 (four) hours as needed. (Patient taking differently: Take 3 mLs by nebulization every 4 (four) hours as needed (shortness of breath). ) 270 mL 1  . KRILL OIL PO Take 1,000 mg by mouth 3 (three) times daily.     [name] l-methylfolate-B6-B12 (METANX) 3-35-2 MG TABS tablet Take 1 tablet by mouth daily.    . LUTEIN PO Take 20 mg by mouth daily.     [name] LYRICA 150 MG capsule Take 2 capsules (300 mg total) by mouth 2 (two) times daily. (Patient taking differently: Take 150 mg by mouth 3 (three) times daily. ) 120 capsule 2  .  Magnesium 500 MG CAPS Take 500 mg by mouth daily with supper.    . memantine (NAMENDA) 10 MG tablet Take 20 mg by mouth at bedtime.     . metFORMIN (GLUCOPHAGE-XR) 500 MG 24 hr tablet Take 2 tablets 2 x /day with Breakfast & Supper  for Diabetes (Patient taking differently: Take 1,000 mg by mouth 2 (two) times daily with a meal. Breakfast and dinner) 360 tablet 3  . metolazone (ZAROXOLYN) 5 MG tablet Take 1 tablet daily for Fluid Retention (Patient taking differently: Take 5 mg by mouth daily as needed (severe swelling). ) 90 tablet 1  . Misc Natural Products (GLUCOS-CHONDROIT-MSM COMPLEX) TABS Take 1 tablet by mouth 3 (three) times daily.    . montelukast (SINGULAIR) 10 MG tablet Take 1 tablet Daily for Allergies (Patient taking differently: Take 10 mg by mouth at bedtime. ) 90 tablet 3  . Multiple Minerals-Vitamins (CITRACAL MAXIMUM PLUS) TABS Take 1 tablet by mouth 3 (three) times daily.    . Multiple Vitamins-Minerals (MULTIVITAMIN WITH MINERALS) tablet Take 1 tablet by mouth daily.    . Multiple Vitamins-Minerals (PRESERVISION AREDS 2) CAPS Take 1 capsule by mouth 2 (two) times daily.    . naloxone (NARCAN) nasal spray 4 mg/0.1 mL Place 1 spray into the nose as needed (opioid overdose).     . ondansetron (ZOFRAN-ODT) 4 MG disintegrating tablet TAKE 1 TABLET (4 MG TOTAL) BY MOUTH EVERY 6 (SIX) HOURS AS NEEDED FOR NAUSEA OR VOMITING. 30 tablet 0  . oxymetazoline (AFRIN) 0.05 % nasal spray Place 1 spray into both nostrils See admin instructions. 1 SPRAY PER SIDE EACH NIGHT BEFORE DYMISTA    .  potassium chloride SA (KLOR-CON M20) 20 MEQ tablet Take 1 tablet 2 x  /day for Potassium (Patient taking differently: Take 20 mEq by mouth 2 (two) times daily. ) 180 tablet 3  . pravastatin (PRAVACHOL) 40 MG tablet Take 1 tablet Daily at Bedtime for Cholesterol (Patient taking differently: Take 20 mg by mouth at bedtime. ) 90 tablet 1  . PROBIOTIC PRODUCT PO Take 1 capsule by mouth 3 (three) times daily.     .  promethazine-dextromethorphan (PROMETHAZINE-DM) 6.25-15 MG/5ML syrup Take 5 mLs by mouth 4 (four) times daily as needed for cough.    . sertraline (ZOLOFT) 50 MG tablet Take 50 mg by mouth 2 (two) times daily.     . sodium chloride (OCEAN) 0.65 % SOLN nasal spray Place 1 spray into both nostrils 4 (four) times daily as needed for congestion. Uses each time before other nasal sprays    . tadalafil (CIALIS) 5 MG tablet Take 5 mg by mouth daily.     . trospium (SANCTURA) 20 MG tablet Take 20 mg by mouth 2 (two) times daily.   2  . TURMERIC PO Take 1 capsule by mouth 3 (three) times daily.     [name] zinc gluconate 50 MG tablet Take 50 mg by mouth daily.    . Azelastine-Fluticasone 137-50 MCG/ACT SUSP 2 spray each nostril 1-2 times daily (Patient not taking: Reported on 05/17/2019) 23 g 5  . diazepam (VALIUM) 5 MG tablet Take one tablet prior to MRI and may repeat times one after 30 minutes. 2 tablet 0  . EPINEPHrine 0.3 mg/0.3 mL IJ SOAJ injection Inject 0.3 mLs (0.3 mg total) into the muscle as needed. (Patient taking differently: Inject 0.3 mg into the muscle as needed for anaphylaxis. ) 2 each 2  . pantoprazole (PROTONIX) 40 MG tablet Take 1 tablet Daily for Acid Indigestion & Reflux (Patient not taking: Reported on 05/17/2019) 90 tablet 3  . PRESCRIPTION MEDICATION Pt receives weekly allergy shots      Results for orders placed or performed during the hospital encounter of 05/21/19 (from the past 48 hour(s))  SARS CORONAVIRUS 2 (TAT 6-24 HRS) Nasopharyngeal Nasopharyngeal Swab     Status: None   Collection Time: 05/21/19  2:07 PM   Specimen: Nasopharyngeal Swab  Result Value Ref Range   SARS Coronavirus 2 NEGATIVE NEGATIVE    Comment: (NOTE) SARS-CoV-2 target nucleic acids are NOT DETECTED. The SARS-CoV-2 RNA is generally detectable in upper and lower respiratory specimens during the acute phase of infection. Negative results do not preclude SARS-CoV-2 infection, do not rule out co-infections  with other pathogens, and should not be used as the sole basis for treatment or other patient management decisions. Negative results must be combined with clinical observations, patient history, and epidemiological information. The expected result is Negative. Fact Sheet for Patients: SugarRoll.be Fact Sheet for Healthcare Providers: https://www.woods-mathews.com/ This test is not yet approved or cleared by the [place] FDA and  has been authorized for detection and/or diagnosis of SARS-CoV-2 by FDA under an Emergency Use Authorization (EUA). This EUA will remain  in effect (meaning this test can be used) for the duration of the COVID-19 declaration under Section 56 4(b)(1) of the Act, 21 U.S.C. section 360bbb-3(b)(1), unless the authorization is terminated or revoked sooner. Performed at [facility], [place] [address], [place], [place] [address]    No results found.  Review of Systems  Constitutional: Positive for activity change.  HENT: Negative.   Respiratory: Positive for shortness  of breath.   Cardiovascular: Negative.   Gastrointestinal: Negative.   Musculoskeletal: Positive for neck pain and neck stiffness.  Neurological: Positive for numbness.  Psychiatric/Behavioral: Negative.     Blood pressure 125/73, pulse 78, temperature 98.3 F (36.8 C), temperature source Oral, resp. rate 18, height 6' (1.829 m), weight (!) 154.2 kg, SpO2 95 %. Physical Exam  Constitutional: He is oriented to person, place, and time. No distress.  HENT:  Head: Normocephalic.  Eyes: Pupils are equal, round, and reactive to light. EOM are normal.  Cardiovascular: Normal heart sounds.  Respiratory: No respiratory distress.  GI: Bowel sounds are normal. There is no abdominal tenderness.  Musculoskeletal:        General: Tenderness (neck) present.  Neurological: He is alert and oriented to person, place, and time.  Skin: Skin is warm and dry.   Psychiatric: He has a normal mood and affect.     Assessment/Plan C5-6 and C6-7 HNP/stenosis  We will proceed with C5-C7 ACDF as scheduled.  Surgical procedure along with potential recovery time discussed.  All questions answered and wishes to proceed.  Benjiman Core, PA-C 05/23/2019, 7:13 AM

## 2019-05-23 NOTE — Progress Notes (Signed)
Orthopedic Tech Progress Note Patient Details:  Mark Harrell 10/16/1960 [id number] OR RN called this morning requesting a [place] for patient. I dropped it off at the desk. Ortho Devices Type of Ortho Device: [place] cervical collar Ortho Device/Splint Location: neck Ortho Device/Splint Interventions: Other (comment)   Post Interventions Patient Tolerated: Other (comment) Instructions Provided: Other (comment)   [name] 05/23/2019, 1:32 PM

## 2019-05-24 ENCOUNTER — Ambulatory Visit: Payer: BC Managed Care – PPO | Admitting: Podiatry

## 2019-05-24 ENCOUNTER — Observation Stay (HOSPITAL_COMMUNITY): Payer: BC Managed Care – PPO

## 2019-05-24 DIAGNOSIS — R7303 Prediabetes: Secondary | ICD-10-CM | POA: Diagnosis not present

## 2019-05-24 DIAGNOSIS — M4322 Fusion of spine, cervical region: Secondary | ICD-10-CM | POA: Diagnosis not present

## 2019-05-24 DIAGNOSIS — N400 Enlarged prostate without lower urinary tract symptoms: Secondary | ICD-10-CM | POA: Diagnosis not present

## 2019-05-24 DIAGNOSIS — M4802 Spinal stenosis, cervical region: Secondary | ICD-10-CM

## 2019-05-24 DIAGNOSIS — Z6841 Body Mass Index (BMI) 40.0 and over, adult: Secondary | ICD-10-CM | POA: Diagnosis not present

## 2019-05-24 DIAGNOSIS — F329 Major depressive disorder, single episode, unspecified: Secondary | ICD-10-CM | POA: Diagnosis not present

## 2019-05-24 DIAGNOSIS — J45909 Unspecified asthma, uncomplicated: Secondary | ICD-10-CM | POA: Diagnosis not present

## 2019-05-24 DIAGNOSIS — E669 Obesity, unspecified: Secondary | ICD-10-CM | POA: Diagnosis not present

## 2019-05-24 DIAGNOSIS — E785 Hyperlipidemia, unspecified: Secondary | ICD-10-CM | POA: Diagnosis not present

## 2019-05-24 DIAGNOSIS — I119 Hypertensive heart disease without heart failure: Secondary | ICD-10-CM | POA: Diagnosis not present

## 2019-05-24 DIAGNOSIS — K219 Gastro-esophageal reflux disease without esophagitis: Secondary | ICD-10-CM | POA: Diagnosis not present

## 2019-05-24 DIAGNOSIS — M50122 Cervical disc disorder at C5-C6 level with radiculopathy: Secondary | ICD-10-CM | POA: Diagnosis not present

## 2019-05-24 DIAGNOSIS — M1990 Unspecified osteoarthritis, unspecified site: Secondary | ICD-10-CM | POA: Diagnosis not present

## 2019-05-24 DIAGNOSIS — G4733 Obstructive sleep apnea (adult) (pediatric): Secondary | ICD-10-CM | POA: Diagnosis not present

## 2019-05-24 DIAGNOSIS — M5000 Cervical disc disorder with myelopathy, unspecified cervical region: Secondary | ICD-10-CM

## 2019-05-24 DIAGNOSIS — F419 Anxiety disorder, unspecified: Secondary | ICD-10-CM | POA: Diagnosis not present

## 2019-05-24 DIAGNOSIS — Z9981 Dependence on supplemental oxygen: Secondary | ICD-10-CM | POA: Diagnosis not present

## 2019-05-24 DIAGNOSIS — M47812 Spondylosis without myelopathy or radiculopathy, cervical region: Secondary | ICD-10-CM | POA: Diagnosis not present

## 2019-05-24 MED ORDER — OXYCODONE HCL ER 10 MG PO T12A: 10.0000 mg | EXTENDED_RELEASE_TABLET | Freq: Two times a day (BID) | ORAL | 0 refills | Status: DC

## 2019-05-24 MED ORDER — PHENOL 1.4 % MT LIQD: 1.0000 | OROMUCOSAL | 0 refills | Status: DC | PRN

## 2019-05-24 MED ORDER — OXYCODONE HCL 5 MG PO TABS: 5.0000 mg | ORAL_TABLET | ORAL | 0 refills | Status: DC | PRN

## 2019-05-24 MED FILL — Thrombin For Soln Kit 20000 Unit: CUTANEOUS | Qty: 1 | Status: AC

## 2019-05-24 NOTE — Progress Notes (Signed)
     Subjective: 1 Day Post-Op Procedure(s) (LRB): ANTERIOR CERVICAL DISCECTOMY FUSION CERVICAL FIVE THROUGH CERVICAL SIX AND CERVICAL SIX THROUGH CERVICAL SEVEN (N/A) Awake , alert and oriented x 4. Voiding without difficulty. Standing and walking with PT.   Patient reports pain as moderate.    Objective:   VITALS:  Temp:  [98 F (36.7 C)-99.6 F (37.6 C)] 98.9 F (37.2 C) (12/29 1605) Pulse Rate:  [92-109] 93 (12/29 1605) Resp:  [18-20] 19 (12/29 1605) BP: (116-154)/(67-91) 116/67 (12/29 1605) SpO2:  [90 %-96 %] 96 % (12/29 1605)  Neurologically intact ABD soft Neurovascular intact Sensation intact distally Intact pulses distally Dorsiflexion/Plantar flexion intact Incision: dressing C/D/I and no drainage No cellulitis present Compartment soft   LABS No results for input(s): HGB, WBC, PLT in the last 72 hours. No results for input(s): NA, K, CL, CO2, BUN, CREATININE, GLUCOSE in the last 72 hours. No results for input(s): LABPT, INR in the last 72 hours.   Assessment/Plan: 1 Day Post-Op Procedure(s) (LRB): ANTERIOR CERVICAL DISCECTOMY FUSION CERVICAL FIVE THROUGH CERVICAL SIX AND CERVICAL SIX THROUGH CERVICAL SEVEN (N/A)  Advance diet Up with therapy D/C IV fluids Discharge home with home health  Use a recliner or sleep with head and neck elevated to decrease swelling in the neck.    [name] 05/24/2019, 4:48 PM Patient ID: Mark Harrell, male   DOB: 1960-10-29, 58 y.o.   MRN: [id number]

## 2019-05-24 NOTE — Progress Notes (Signed)
     Subjective: 1 Day Post-Op Procedure(s) (LRB): ANTERIOR CERVICAL DISCECTOMY FUSION CERVICAL FIVE THROUGH CERVICAL SIX AND CERVICAL SIX THROUGH CERVICAL SEVEN (N/A)Awake, alert and oriented x 4. Reports that the recliner fell back when pulled away from the wall 45 minutes before rounds this AM. Complains of posterior neck pain. Voided post removal of foley. CPAP for  Chronic upper airway symptoms. Doesn't use at home.   Patient reports pain as moderate.    Objective:   VITALS:  Temp:  [98.4 F (36.9 C)-99.6 F (37.6 C)] 99.2 F (37.3 C) (12/29 0338) Pulse Rate:  [83-109] 96 (12/29 0338) Resp:  [11-20] 18 (12/29 0338) BP: (137-174)/(72-98) 151/91 (12/29 0338) SpO2:  [93 %-98 %] 94 % (12/29 0735)  Neurologically intact ABD soft Neurovascular intact Sensation intact distally Intact pulses distally Dorsiflexion/Plantar flexion intact Incision: no drainage, scant drainage and TLS drain suture remove and drain removed. Clear serous drainage in tubing minimal drainage no active bleeding.   LABS No results for input(s): HGB, WBC, PLT in the last 72 hours. No results for input(s): NA, K, CL, CO2, BUN, CREATININE, GLUCOSE in the last 72 hours. No results for input(s): LABPT, INR in the last 72 hours.   Assessment/Plan: 1 Day Post-Op Procedure(s) (LRB): ANTERIOR CERVICAL DISCECTOMY FUSION CERVICAL FIVE THROUGH CERVICAL SIX AND CERVICAL SIX THROUGH CERVICAL SEVEN (N/A)  Advance diet Up with therapy D/C IV fluids Discharge home with home health  Obtain xray of C-spine. Reevaluate this afternoon after my case that will be a long Surgery 6 hours. Probably around 3-4 PM.  [name] 05/24/2019, 7:47 AM Patient ID: Mark Harrell, male   DOB: 07/15/1960, 58 y.o.   MRN: [id number]

## 2019-05-24 NOTE — Anesthesia Postprocedure Evaluation (Signed)
Anesthesia Post Note  Patient: JHAYDEN KUTCHER  Procedure(s) Performed: ANTERIOR CERVICAL DISCECTOMY FUSION CERVICAL FIVE THROUGH CERVICAL SIX AND CERVICAL SIX THROUGH CERVICAL SEVEN (N/A Spine Cervical)     Patient location during evaluation: PACU Anesthesia Type: General Level of consciousness: awake Pain management: pain level controlled Vital Signs Assessment: post-procedure vital signs reviewed and stable Respiratory status: spontaneous breathing Cardiovascular status: stable Postop Assessment: no apparent nausea or vomiting Anesthetic complications: no    Last Vitals:  Vitals:   05/24/19 1205 05/24/19 1207  BP: (!) 154/73 (!) 147/70  Pulse: 92 92  Resp: 18   Temp: 37.4 C   SpO2: 96%     Last Pain:  Vitals:   05/24/19 1210  TempSrc:   PainSc: 8                  Keishon Chavarin

## 2019-05-24 NOTE — Plan of Care (Signed)
Pt given D/C instructions with verbal understanding. Rx's were sent to pharmacy by MD. Pt's incision is clean and dry with no sign of infection. Pt's IV was removed prior to D/C. Pt D/C'd home via wheelchair per MD order. Pt is stable @ D/C and has no other needs at this time. Janson Lamar, RN  

## 2019-05-24 NOTE — Evaluation (Signed)
Occupational Therapy Evaluation Patient Details Name: Mark Harrell MRN: [id number] DOB: July 06, 1960 Today's Date: 05/24/2019    History of Present Illness Pt is a 58 y/o male with neck pain and L UE radiculpathy, s/p ACDF C5-6, C6-7. PMH: anxiety, arthritis, depression HTN, IBS, 2L O2 at baseline, pneumonia.    Clinical Impression   PTA patient independent, using cane as needed. Admitted for above and limited by problem list below, including pain, impaired balance, and decreased activity tolerance. Educated on cervical precautions, brace mgmt and wear schedule, ADL compensatory techniques, safety, recommendations.  He requires min assist for LB ADLs, min guard for transfers and supervision for toileting.  He will benefit from continued OT services while admitted to optimize independence and safety with ADLs while adhering to precautions, but anticipate no further needs after dc home.      Follow Up Recommendations  No OT follow up;Supervision - Intermittent    Equipment Recommendations  None recommended by OT    Recommendations for Other Services PT consult     Precautions / Restrictions Precautions Precautions: Cervical Precaution Booklet Issued: Yes (comment) Precaution Comments: reviewed precautions with pt  Required Braces or Orthoses: Cervical Brace Cervical Brace: Hard collar;At all times Restrictions Weight Bearing Restrictions: No      Mobility Bed Mobility               General bed mobility comments: OOB in recliner upon entry  Transfers Overall transfer level: Needs assistance Equipment used: Straight cane Transfers: Sit to/from Stand;Stand Pivot Transfers Sit to Stand: Min guard Stand pivot transfers: Min guard       General transfer comment: min guard for safety/balance     Balance Overall balance assessment: Needs assistance Sitting-balance support: No upper extremity supported;Feet supported Sitting balance-Leahy Scale: Good     Standing  balance support: Single extremity supported;During functional activity Standing balance-Leahy Scale: Fair Standing balance comment: preference to 1 UE support                            ADL either performed or assessed with clinical judgement   ADL Overall ADL's : Needs assistance/impaired     Grooming: Standing;Supervision/safety   Upper Body Bathing: Sitting;Set up   Lower Body Bathing: Min guard;Sit to/from stand   Upper Body Dressing : Sitting;Minimal assistance   Lower Body Dressing: Minimal assistance;Sit to/from stand;Cueing for back precautions;Cueing for compensatory techniques Lower Body Dressing Details (indicate cue type and reason): difficulty reaching to don socks, anticipate will improve with pain mgmt control; min guard to close supervision sit to stand  Toilet Transfer: Min guard;Stand-pivot(cane)           Functional mobility during ADLs: Min guard(cane) General ADL Comments: pt limited by pain and precautions     Vision   Vision Assessment?: No apparent visual deficits     Perception     Praxis      Pertinent Vitals/Pain Pain Assessment: 0-10 Pain Score: 9  Pain Location: neck, shoulders Pain Descriptors / Indicators: Discomfort;Grimacing;Operative site guarding Pain Intervention(s): Monitored during session;Repositioned;Limited activity within patient's tolerance;Ice applied     Hand Dominance Right   Extremity/Trunk Assessment Upper Extremity Assessment Upper Extremity Assessment: LUE deficits/detail;RUE deficits/detail RUE Deficits / Details: grossly 4/5 within precautions  LUE Deficits / Details: grossly 4/5 within precautions, limited opposition, numbness in hand  LUE Sensation: decreased light touch LUE Coordination: decreased fine motor   Lower Extremity Assessment Lower Extremity Assessment: Defer to PT  evaluation   Cervical / Trunk Assessment Cervical / Trunk Assessment: Other exceptions Cervical / Trunk Exceptions:  s/p cervical surgery   Communication Communication Communication: No difficulties   Cognition Arousal/Alertness: Awake/alert Behavior During Therapy: WFL for tasks assessed/performed Overall Cognitive Status: Within Functional Limits for tasks assessed                                 General Comments: appears WFL, limited by pain   General Comments  as therapist exited to get handout, returned to find patient in restroom completing transfers and toileting, grooming with modified independence    Exercises     Shoulder Instructions      Home Living Family/patient expects to be discharged to:: Private residence Living Arrangements: Other (Comment) Available Help at Discharge: Family;Available 24 hours/day Type of Home: House Home Access: Stairs to enter [employer] of Steps: 3   Home Layout: One level     Bathroom Shower/Tub: Occupational psychologist: Handicapped height     Home Equipment: [place] - single point;Shower seat   Additional Comments: his mother is bedbound, and his uncle cannot phyiscally assist him      Prior Functioning/Environment Level of Independence: Independent with assistive device(s)        Comments: used cane for mobility in community, independent ADls; cares for his mother         OT Problem List: Decreased activity tolerance;Impaired balance (sitting and/or standing);Pain;Cardiopulmonary status limiting activity;Decreased knowledge of precautions      OT Treatment/Interventions: Self-care/ADL training;DME and/or AE instruction;Balance training;Patient/family education;Therapeutic activities;Therapeutic exercise    OT Goals(Current goals can be found in the care plan section) Acute Rehab OT Goals Patient Stated Goal: less pain OT Goal Formulation: With patient Time For Goal Achievement: 06/07/19 Potential to Achieve Goals: Good  OT Frequency: Min 2X/week   Barriers to D/C:            Co-evaluation               AM-PAC OT "6 Clicks" Daily Activity     Outcome Measure Help from another person eating meals?: A Little Help from another person taking care of personal grooming?: A Little Help from another person toileting, which includes using toliet, bedpan, or urinal?: A Little Help from another person bathing (including washing, rinsing, drying)?: A Little Help from another person to put on and taking off regular upper body clothing?: A Little Help from another person to put on and taking off regular lower body clothing?: A Little 6 Click Score: 18   End of Session Equipment Utilized During Treatment: Cervical collar Nurse Communication: Mobility status  Activity Tolerance: Patient tolerated treatment well Patient left: in chair;with call bell/phone within reach  OT Visit Diagnosis: Pain;Unsteadiness on feet (R26.81) Pain - part of body: Shoulder(neck)                [id number] OT Time Calculation (min): 19 min Charges:  OT General Charges $OT Visit: 1 Visit OT Evaluation $OT Eval Low Complexity: 1 Low  Jolaine Artist, OT Acute Rehabilitation Services Pager [phone] Office [phone]   Delight Stare 05/24/2019, 9:59 AM

## 2019-05-24 NOTE — Discharge Instructions (Signed)
No lifting greater than 10 lbs. No overhead use of arms. °Avoid bending,and twisting neck. °Walk in house for first week them may start to get out slowly increasing distance up to one mile by 3 weeks post op. °Keep incision dry for 3 days, may then bathe and wet incision using a [place] collar when showering. °Call if any fevers >101, chills, or increasing numbness or weakness or increased swelling or drainage. ° °

## 2019-05-24 NOTE — Evaluation (Signed)
Physical Therapy Evaluation Patient Details Name: Mark Harrell MRN: [id number] DOB: 06-Mar-1961 Today's Date: 05/24/2019   History of Present Illness  Pt is a 58 y/o male with neck pain and L UE radiculpathy, s/p ACDF C5-6, C6-7. PMH: anxiety, arthritis, depression HTN, IBS, 2L O2 at baseline, pneumonia.   Clinical Impression  Pt admitted with above diagnosis. At the time of PT eval, pt was able to demonstrate transfers and ambulation with gross min guard assist to supervision for safety with a SPC for support. Pt was educated on precautions, brace application/wearing schedule, appropriate activity progression, and car transfer. Pt currently with functional limitations due to the deficits listed below (see PT Problem List). Pt will benefit from skilled PT to increase their independence and safety with mobility to allow discharge to the venue listed below.      Follow Up Recommendations No PT follow up;Supervision for mobility/OOB    Equipment Recommendations  None recommended by PT    Recommendations for Other Services       Precautions / Restrictions Precautions Precautions: Cervical Precaution Booklet Issued: Yes (comment) Precaution Comments: reviewed precautions with pt  Required Braces or Orthoses: Cervical Brace Cervical Brace: Hard collar;At all times([place] collar present in room as well) Restrictions Weight Bearing Restrictions: No      Mobility  Bed Mobility               General bed mobility comments: OOB in recliner upon entry  Transfers Overall transfer level: Needs assistance Equipment used: Straight cane Transfers: Sit to/from Stand;Stand Pivot Transfers Sit to Stand: Min guard         General transfer comment: Both hands on cane which was positioned in between LE's on standing attempt. Pt was able to power-up to full stand without assist however was cued for safety and preference for hand placement on seated surface for safety.     Ambulation/Gait Ambulation/Gait assistance: Min guard;Supervision Gait Distance (Feet): 120 Feet Assistive device: Straight cane Gait Pattern/deviations: Step-through pattern;Decreased stride length;Trunk flexed Gait velocity: Decreased Gait velocity interpretation: <1.8 ft/sec, indicate of risk for recurrent falls General Gait Details: Very slow and guarded. Pt was able to ambulate in hall with min guard assist initially, progressing to supervision for safety with heavy reliance on the [facility]. 2 standing rest breaks about halfway due to pain.   Stairs            Wheelchair Mobility    Modified Rankin (Stroke Patients Only)       Balance Overall balance assessment: Needs assistance Sitting-balance support: No upper extremity supported;Feet supported Sitting balance-Leahy Scale: Good     Standing balance support: Single extremity supported;During functional activity Standing balance-Leahy Scale: Fair Standing balance comment: preference to 1 UE support                              Pertinent Vitals/Pain Pain Assessment: 0-10 Pain Score: 9  Pain Location: neck, shoulders Pain Descriptors / Indicators: Discomfort;Grimacing;Operative site guarding Pain Intervention(s): Limited activity within patient's tolerance;Monitored during session;Repositioned    Home Living Family/patient expects to be discharged to:: Private residence Living Arrangements: Other (Comment) Available Help at Discharge: Family;Available 24 hours/day Type of Home: House Home Access: Stairs to enter   [employer] of Steps: 3 Home Layout: One level Home Equipment: Cane - single point;Shower seat Additional Comments: his mother is bedbound, and his uncle cannot phyiscally assist him    Prior Function Level of Independence:  Independent with assistive device(s)         Comments: used cane for mobility in community, independent ADls; cares for his mother      Hand  Dominance   Dominant Hand: Right    Extremity/Trunk Assessment   Upper Extremity Assessment Upper Extremity Assessment: Defer to OT evaluation    Lower Extremity Assessment Lower Extremity Assessment: Generalized weakness    Cervical / Trunk Assessment Cervical / Trunk Assessment: Other exceptions Cervical / Trunk Exceptions: s/p cervical surgery  Communication   Communication: No difficulties  Cognition Arousal/Alertness: Awake/alert Behavior During Therapy: WFL for tasks assessed/performed Overall Cognitive Status: Within Functional Limits for tasks assessed                                 General Comments: appears WFL, limited by pain      General Comments      Exercises     Assessment/Plan    PT Assessment Patient needs continued PT services  PT Problem List Decreased strength;Decreased activity tolerance;Decreased balance;Decreased mobility;Decreased knowledge of use of DME;Decreased safety awareness;Decreased knowledge of precautions;Pain       PT Treatment Interventions DME instruction;Gait training;Stair training;Functional mobility training;Therapeutic activities;Therapeutic exercise;Neuromuscular re-education;Patient/family education    PT Goals (Current goals can be found in the Care Plan section)  Acute Rehab PT Goals Patient Stated Goal: less pain PT Goal Formulation: With patient Time For Goal Achievement: 05/31/19 Potential to Achieve Goals: Good    Frequency Min 5X/week   Barriers to discharge        Co-evaluation               AM-PAC PT "6 Clicks" Mobility  Outcome Measure Help needed turning from your back to your side while in a flat bed without using bedrails?: A Little Help needed moving from lying on your back to sitting on the side of a flat bed without using bedrails?: A Little Help needed moving to and from a bed to a chair (including a wheelchair)?: A Little Help needed standing up from a chair using your arms  (e.g., wheelchair or bedside chair)?: A Little Help needed to walk in hospital room?: A Little Help needed climbing 3-5 steps with a railing? : A Little 6 Click Score: 18    End of Session Equipment Utilized During Treatment: Cervical collar Activity Tolerance: Patient limited by pain Patient left: in chair;with call bell/phone within reach Nurse Communication: Mobility status PT Visit Diagnosis: Unsteadiness on feet (R26.81);Pain;Difficulty in walking, not elsewhere classified (R26.2) Pain - part of body: (neck)    Time: [id number] PT Time Calculation (min) (ACUTE ONLY): 29 min   Charges:   PT Evaluation $PT Eval Moderate Complexity: 1 Mod PT Treatments $Gait Training: 8-22 mins        Rolinda Roan, PT, DPT Acute Rehabilitation Services Pager: [phone] Office: [phone]   Thelma Comp 05/24/2019, 1:14 PM

## 2019-05-25 ENCOUNTER — Encounter: Payer: Self-pay | Admitting: Specialist

## 2019-05-25 ENCOUNTER — Encounter: Payer: Self-pay | Admitting: *Deleted

## 2019-05-25 ENCOUNTER — Telehealth: Payer: Self-pay | Admitting: Specialist

## 2019-05-25 NOTE — Telephone Encounter (Signed)
I called and advised him that his insurance required prior auth for the Oxycotin and I have submitted for it and we are waiting on them to approve it-- he states that he understands and appreciates the effort I have put in to getting this taken care of

## 2019-05-25 NOTE — Telephone Encounter (Signed)
Patient called. Says the pharmacy would only give him 1 of the pain medications. Says he should have OxyContin as well. He would like someone to call him. His call back number is [phone]

## 2019-05-25 NOTE — Progress Notes (Signed)
Late entry:  Pt d/c 05/24/2019 after hours.  NCM received call from discharging nurse, 05/25/2019, informing NCM pt d/c with [place] orders. NCM called and spoke with pt via phone [phone]) about noted  HHPT order. Pt agreeable to [facility] services. Choice offered. Pt without preference. Referral made with [facility] and accepted. SOC to begin on Mon. or Tues. of next week, pt made aware and is ok with plan. Whitman Hero RN,BSN,CM

## 2019-05-26 ENCOUNTER — Emergency Department (HOSPITAL_COMMUNITY): Payer: BC Managed Care – PPO

## 2019-05-26 ENCOUNTER — Inpatient Hospital Stay (HOSPITAL_COMMUNITY)
Admission: EM | Admit: 2019-05-26 | Discharge: 2019-05-28 | DRG: 202 | Disposition: A | Discharge status: Home / Self Care | Payer: BC Managed Care – PPO | Attending: Specialist | Admitting: Specialist

## 2019-05-26 ENCOUNTER — Other Ambulatory Visit: Payer: Self-pay

## 2019-05-26 ENCOUNTER — Telehealth: Payer: Self-pay | Admitting: Specialist

## 2019-05-26 ENCOUNTER — Encounter (HOSPITAL_COMMUNITY): Payer: Self-pay

## 2019-05-26 DIAGNOSIS — R490 Dysphonia: Secondary | ICD-10-CM | POA: Diagnosis not present

## 2019-05-26 DIAGNOSIS — Z9181 History of falling: Secondary | ICD-10-CM | POA: Diagnosis not present

## 2019-05-26 DIAGNOSIS — E785 Hyperlipidemia, unspecified: Secondary | ICD-10-CM | POA: Diagnosis not present

## 2019-05-26 DIAGNOSIS — Z87891 Personal history of nicotine dependence: Secondary | ICD-10-CM | POA: Diagnosis not present

## 2019-05-26 DIAGNOSIS — R911 Solitary pulmonary nodule: Secondary | ICD-10-CM

## 2019-05-26 DIAGNOSIS — I10 Essential (primary) hypertension: Secondary | ICD-10-CM | POA: Diagnosis not present

## 2019-05-26 DIAGNOSIS — J398 Other specified diseases of upper respiratory tract: Secondary | ICD-10-CM | POA: Diagnosis not present

## 2019-05-26 DIAGNOSIS — Z881 Allergy status to other antibiotic agents status: Secondary | ICD-10-CM | POA: Diagnosis not present

## 2019-05-26 DIAGNOSIS — Z888 Allergy status to other drugs, medicaments and biological substances status: Secondary | ICD-10-CM | POA: Diagnosis not present

## 2019-05-26 DIAGNOSIS — R0902 Hypoxemia: Secondary | ICD-10-CM | POA: Diagnosis not present

## 2019-05-26 DIAGNOSIS — K219 Gastro-esophageal reflux disease without esophagitis: Secondary | ICD-10-CM | POA: Diagnosis present

## 2019-05-26 DIAGNOSIS — J392 Other diseases of pharynx: Secondary | ICD-10-CM | POA: Diagnosis present

## 2019-05-26 DIAGNOSIS — J80 Acute respiratory distress syndrome: Secondary | ICD-10-CM | POA: Diagnosis not present

## 2019-05-26 DIAGNOSIS — Z8042 Family history of malignant neoplasm of prostate: Secondary | ICD-10-CM | POA: Diagnosis not present

## 2019-05-26 DIAGNOSIS — Z79899 Other long term (current) drug therapy: Secondary | ICD-10-CM

## 2019-05-26 DIAGNOSIS — N400 Enlarged prostate without lower urinary tract symptoms: Secondary | ICD-10-CM | POA: Diagnosis present

## 2019-05-26 DIAGNOSIS — G4733 Obstructive sleep apnea (adult) (pediatric): Secondary | ICD-10-CM | POA: Diagnosis not present

## 2019-05-26 DIAGNOSIS — R1310 Dysphagia, unspecified: Secondary | ICD-10-CM | POA: Diagnosis not present

## 2019-05-26 DIAGNOSIS — R1313 Dysphagia, pharyngeal phase: Secondary | ICD-10-CM | POA: Diagnosis not present

## 2019-05-26 DIAGNOSIS — E291 Testicular hypofunction: Secondary | ICD-10-CM | POA: Diagnosis present

## 2019-05-26 DIAGNOSIS — R000 Tachycardia, unspecified: Secondary | ICD-10-CM | POA: Diagnosis not present

## 2019-05-26 DIAGNOSIS — F329 Major depressive disorder, single episode, unspecified: Secondary | ICD-10-CM | POA: Diagnosis not present

## 2019-05-26 DIAGNOSIS — Z833 Family history of diabetes mellitus: Secondary | ICD-10-CM

## 2019-05-26 DIAGNOSIS — Z6841 Body Mass Index (BMI) 40.0 and over, adult: Secondary | ICD-10-CM

## 2019-05-26 DIAGNOSIS — M4802 Spinal stenosis, cervical region: Secondary | ICD-10-CM | POA: Diagnosis not present

## 2019-05-26 DIAGNOSIS — Z20822 Contact with and (suspected) exposure to covid-19: Secondary | ICD-10-CM | POA: Diagnosis present

## 2019-05-26 DIAGNOSIS — J449 Chronic obstructive pulmonary disease, unspecified: Secondary | ICD-10-CM | POA: Diagnosis not present

## 2019-05-26 DIAGNOSIS — R0602 Shortness of breath: Secondary | ICD-10-CM

## 2019-05-26 DIAGNOSIS — Z8249 Family history of ischemic heart disease and other diseases of the circulatory system: Secondary | ICD-10-CM

## 2019-05-26 DIAGNOSIS — R52 Pain, unspecified: Secondary | ICD-10-CM | POA: Diagnosis not present

## 2019-05-26 DIAGNOSIS — R221 Localized swelling, mass and lump, neck: Secondary | ICD-10-CM | POA: Diagnosis not present

## 2019-05-26 DIAGNOSIS — Z20828 Contact with and (suspected) exposure to other viral communicable diseases: Secondary | ICD-10-CM | POA: Diagnosis not present

## 2019-05-26 DIAGNOSIS — Z807 Family history of other malignant neoplasms of lymphoid, hematopoietic and related tissues: Secondary | ICD-10-CM

## 2019-05-26 LAB — I-STAT CHEM 8, ED
BUN: 11 mg/dL (ref 6–20)
Calcium, Ion: 1.02 mmol/L — ABNORMAL LOW (ref 1.15–1.40)
Chloride: 96 mmol/L — ABNORMAL LOW (ref 98–111)
Creatinine, Ser: 0.8 mg/dL (ref 0.61–1.24)
Glucose, Bld: 115 mg/dL — ABNORMAL HIGH (ref 70–99)
HCT: 43 % (ref 39.0–52.0)
Hemoglobin: 14.6 g/dL (ref 13.0–17.0)
Potassium: 4.1 mmol/L (ref 3.5–5.1)
Sodium: 135 mmol/L (ref 135–145)
TCO2: 33 mmol/L — ABNORMAL HIGH (ref 22–32)

## 2019-05-26 LAB — COMPREHENSIVE METABOLIC PANEL
ALT: 19 U/L (ref 0–44)
AST: 29 U/L (ref 15–41)
Albumin: 3.1 g/dL — ABNORMAL LOW (ref 3.5–5.0)
Alkaline Phosphatase: 45 U/L (ref 38–126)
Anion gap: 12 (ref 5–15)
BUN: 10 mg/dL (ref 6–20)
CO2: 27 mmol/L (ref 22–32)
Calcium: 8.4 mg/dL — ABNORMAL LOW (ref 8.9–10.3)
Chloride: 97 mmol/L — ABNORMAL LOW (ref 98–111)
Creatinine, Ser: 0.89 mg/dL (ref 0.61–1.24)
GFR calc Af Amer: >60 mL/min (ref >60)
GFR calc non Af Amer: >60 mL/min (ref >60)
Glucose, Bld: 121 mg/dL — ABNORMAL HIGH (ref 70–99)
Potassium: 4 mmol/L (ref 3.5–5.1)
Sodium: 136 mmol/L (ref 135–145)
Total Bilirubin: 2.3 mg/dL — ABNORMAL HIGH (ref 0.3–1.2)
Total Protein: 6 g/dL — ABNORMAL LOW (ref 6.5–8.1)

## 2019-05-26 LAB — SEDIMENTATION RATE: Sed Rate: 40 mm/hr — ABNORMAL HIGH (ref 0–16)

## 2019-05-26 LAB — CBC WITH DIFFERENTIAL/PLATELET
Abs Immature Granulocytes: 0.06 10*3/uL (ref 0.00–0.07)
Basophils Absolute: 0.1 10*3/uL (ref 0.0–0.1)
Basophils Relative: 1 %
Eosinophils Absolute: 0.2 10*3/uL (ref 0.0–0.5)
Eosinophils Relative: 1 %
HCT: 43.2 % (ref 39.0–52.0)
Hemoglobin: 13.9 g/dL (ref 13.0–17.0)
Immature Granulocytes: 1 %
Lymphocytes Relative: 26 %
Lymphs Abs: 3.4 10*3/uL (ref 0.7–4.0)
MCH: 29.7 pg (ref 26.0–34.0)
MCHC: 32.2 g/dL (ref 30.0–36.0)
MCV: 92.3 fL (ref 80.0–100.0)
Monocytes Absolute: 1.5 10*3/uL — ABNORMAL HIGH (ref 0.1–1.0)
Monocytes Relative: 12 %
Neutro Abs: 7.5 10*3/uL (ref 1.7–7.7)
Neutrophils Relative %: 59 %
Platelets: 234 10*3/uL (ref 150–400)
RBC: 4.68 MIL/uL (ref 4.22–5.81)
RDW: 13.7 % (ref 11.5–15.5)
WBC: 12.7 10*3/uL — ABNORMAL HIGH (ref 4.0–10.5)
nRBC: 0 % (ref 0.0–0.2)

## 2019-05-26 LAB — LACTIC ACID, PLASMA
Lactic Acid, Venous: 2.1 mmol/L (ref 0.5–1.9)
Lactic Acid, Venous: 1 mmol/L (ref 0.5–1.9)

## 2019-05-26 LAB — HEMOGLOBIN A1C
Hgb A1c MFr Bld: 4.8 % (ref 4.8–5.6)
Mean Plasma Glucose: 91.06 mg/dL

## 2019-05-26 LAB — CBG MONITORING, ED: Glucose-Capillary: 135 mg/dL — ABNORMAL HIGH (ref 70–99)

## 2019-05-26 MED ORDER — DOCUSATE SODIUM 100 MG PO CAPS
100.0000 mg | ORAL_CAPSULE | Freq: Two times a day (BID) | ORAL | Status: DC
  Administered 2019-05-26 – 2019-05-28 (×4): 100 mg via ORAL
  Filled 2019-05-26 (×4): qty 1

## 2019-05-26 MED ORDER — PRAVASTATIN SODIUM 10 MG PO TABS
20.0000 mg | ORAL_TABLET | Freq: Every day | ORAL | Status: DC
  Administered 2019-05-26 – 2019-05-27 (×2): 20 mg via ORAL
  Filled 2019-05-26 (×2): qty 2

## 2019-05-26 MED ORDER — DEXAMETHASONE 4 MG PO TABS
4.0000 mg | ORAL_TABLET | Freq: Four times a day (QID) | ORAL | Status: AC
  Filled 2019-05-26: qty 1

## 2019-05-26 MED ORDER — ACETAMINOPHEN 650 MG RE SUPP: 650.0000 mg | RECTAL | Status: DC | PRN

## 2019-05-26 MED ORDER — METHOCARBAMOL 1000 MG/10ML IJ SOLN
500.0000 mg | Freq: Four times a day (QID) | INTRAVENOUS | Status: DC | PRN
  Filled 2019-05-26: qty 5

## 2019-05-26 MED ORDER — GUAIFENESIN ER 600 MG PO TB12
1200.0000 mg | ORAL_TABLET | Freq: Two times a day (BID) | ORAL | Status: DC
  Administered 2019-05-26 – 2019-05-28 (×4): 1200 mg via ORAL
  Filled 2019-05-26 (×4): qty 2

## 2019-05-26 MED ORDER — NALOXONE HCL 4 MG/0.1ML NA LIQD
1.0000 | NASAL | Status: DC | PRN
  Filled 2019-05-26: qty 8

## 2019-05-26 MED ORDER — MEMANTINE HCL 5 MG PO TABS
20.0000 mg | ORAL_TABLET | Freq: Every day | ORAL | Status: DC
  Administered 2019-05-26 – 2019-05-27 (×2): 20 mg via ORAL
  Filled 2019-05-26 (×2): qty 4
  Filled 2019-05-26: qty 2

## 2019-05-26 MED ORDER — ALFUZOSIN HCL ER 10 MG PO TB24
10.0000 mg | ORAL_TABLET | Freq: Every day | ORAL | Status: DC
  Administered 2019-05-26 – 2019-05-27 (×2): 10 mg via ORAL
  Filled 2019-05-26 (×2): qty 1

## 2019-05-26 MED ORDER — HYDROMORPHONE HCL 1 MG/ML IJ SOLN
1.0000 mg | Freq: Once | INTRAMUSCULAR | Status: AC
  Administered 2019-05-26: 1 mg via INTRAVENOUS
  Filled 2019-05-26: qty 1

## 2019-05-26 MED ORDER — PHENOL 1.4 % MT LIQD
1.0000 | OROMUCOSAL | Status: DC | PRN
  Filled 2019-05-26: qty 177

## 2019-05-26 MED ORDER — ONDANSETRON HCL 4 MG PO TABS: 4.0000 mg | ORAL_TABLET | Freq: Four times a day (QID) | ORAL | Status: DC | PRN

## 2019-05-26 MED ORDER — ELUXADOLINE 100 MG PO TABS: 100.0000 mg | ORAL_TABLET | Freq: Two times a day (BID) | ORAL | Status: DC

## 2019-05-26 MED ORDER — LORATADINE 10 MG PO TABS: 10.0000 mg | ORAL_TABLET | Freq: Every day | ORAL | Status: DC

## 2019-05-26 MED ORDER — PANTOPRAZOLE SODIUM 40 MG PO TBEC
40.0000 mg | DELAYED_RELEASE_TABLET | Freq: Every day | ORAL | Status: DC
  Administered 2019-05-26: 40 mg via ORAL
  Filled 2019-05-26: qty 1

## 2019-05-26 MED ORDER — PROMETHAZINE-DM 6.25-15 MG/5ML PO SYRP: 5.0000 mL | ORAL_SOLUTION | Freq: Four times a day (QID) | ORAL | Status: DC | PRN

## 2019-05-26 MED ORDER — DARIFENACIN HYDROBROMIDE ER 7.5 MG PO TB24
7.5000 mg | ORAL_TABLET | Freq: Every day | ORAL | Status: DC
  Administered 2019-05-26 – 2019-05-28 (×3): 7.5 mg via ORAL
  Filled 2019-05-26 (×3): qty 1

## 2019-05-26 MED ORDER — POTASSIUM CHLORIDE CRYS ER 20 MEQ PO TBCR
20.0000 meq | EXTENDED_RELEASE_TABLET | Freq: Two times a day (BID) | ORAL | Status: DC
  Administered 2019-05-26 – 2019-05-28 (×4): 20 meq via ORAL
  Filled 2019-05-26 (×4): qty 1

## 2019-05-26 MED ORDER — VITAMIN D 25 MCG (1000 UNIT) PO TABS
10000.0000 [IU] | ORAL_TABLET | Freq: Three times a day (TID) | ORAL | Status: DC
  Filled 2019-05-26 (×3): qty 10

## 2019-05-26 MED ORDER — FLUTICASONE FUROATE-VILANTEROL 100-25 MCG/INH IN AEPB
1.0000 | INHALATION_SPRAY | Freq: Every day | RESPIRATORY_TRACT | Status: DC
  Administered 2019-05-27 – 2019-05-28 (×2): 1 via RESPIRATORY_TRACT
  Filled 2019-05-26: qty 28

## 2019-05-26 MED ORDER — DEXAMETHASONE SODIUM PHOSPHATE 10 MG/ML IJ SOLN
12.0000 mg | Freq: Once | INTRAMUSCULAR | Status: AC
  Administered 2019-05-26: 12 mg via INTRAVENOUS
  Filled 2019-05-26: qty 2

## 2019-05-26 MED ORDER — ONDANSETRON HCL 4 MG/2ML IJ SOLN: 4.0000 mg | Freq: Four times a day (QID) | INTRAMUSCULAR | Status: DC | PRN

## 2019-05-26 MED ORDER — SODIUM CHLORIDE 0.9 % IV SOLN: 250.0000 mL | INTRAVENOUS | Status: DC

## 2019-05-26 MED ORDER — MORPHINE SULFATE (PF) 2 MG/ML IV SOLN: 1.0000 mg | INTRAVENOUS | Status: DC | PRN

## 2019-05-26 MED ORDER — SODIUM CHLORIDE 0.9 % IV BOLUS
1000.0000 mL | Freq: Once | INTRAVENOUS | Status: AC
  Administered 2019-05-26: 1000 mL via INTRAVENOUS

## 2019-05-26 MED ORDER — COQ-10 400 MG PO CAPS: 400.0000 mg | ORAL_CAPSULE | Freq: Every day | ORAL | Status: DC

## 2019-05-26 MED ORDER — POLYETHYLENE GLYCOL 3350 17 G PO PACK: 17.0000 g | PACK | Freq: Every day | ORAL | Status: DC | PRN

## 2019-05-26 MED ORDER — CETAPHIL MOISTURIZING EX LOTN
1.0000 "application " | TOPICAL_LOTION | Freq: Two times a day (BID) | CUTANEOUS | Status: DC
  Administered 2019-05-26 – 2019-05-28 (×4): 1 via TOPICAL
  Filled 2019-05-26: qty 473

## 2019-05-26 MED ORDER — BISOPROLOL-HYDROCHLOROTHIAZIDE 5-6.25 MG PO TABS
1.0000 | ORAL_TABLET | Freq: Every day | ORAL | Status: DC
  Administered 2019-05-26 – 2019-05-28 (×3): 1 via ORAL
  Filled 2019-05-26 (×3): qty 1

## 2019-05-26 MED ORDER — DONEPEZIL HCL 23 MG PO TABS
23.0000 mg | ORAL_TABLET | Freq: Every day | ORAL | Status: DC
  Administered 2019-05-26 – 2019-05-27 (×2): 23 mg via ORAL
  Filled 2019-05-26 (×2): qty 1

## 2019-05-26 MED ORDER — SODIUM CHLORIDE 0.9% FLUSH: 3.0000 mL | INTRAVENOUS | Status: DC | PRN

## 2019-05-26 MED ORDER — OXYMETAZOLINE HCL 0.05 % NA SOLN
1.0000 | Freq: Every day | NASAL | Status: DC
  Administered 2019-05-26 – 2019-05-27 (×2): 1 via NASAL
  Filled 2019-05-26: qty 30

## 2019-05-26 MED ORDER — PANTOPRAZOLE SODIUM 40 MG IV SOLR
40.0000 mg | Freq: Every day | INTRAVENOUS | Status: DC
  Filled 2019-05-26: qty 40

## 2019-05-26 MED ORDER — PROBIOTIC PRODUCT PO CHEW: CHEWABLE_TABLET | Freq: Three times a day (TID) | ORAL | Status: DC

## 2019-05-26 MED ORDER — MONTELUKAST SODIUM 10 MG PO TABS
10.0000 mg | ORAL_TABLET | Freq: Every day | ORAL | Status: DC
  Administered 2019-05-26 – 2019-05-27 (×2): 10 mg via ORAL
  Filled 2019-05-26 (×3): qty 1

## 2019-05-26 MED ORDER — ALUM & MAG HYDROXIDE-SIMETH 200-200-20 MG/5ML PO SUSP: 30.0000 mL | Freq: Four times a day (QID) | ORAL | Status: DC | PRN

## 2019-05-26 MED ORDER — FLUTICASONE-UMECLIDIN-VILANT 100-62.5-25 MCG/INH IN AEPB: 1.0000 | INHALATION_SPRAY | Freq: Every day | RESPIRATORY_TRACT | Status: DC

## 2019-05-26 MED ORDER — OXYCODONE HCL 5 MG PO TABS
10.0000 mg | ORAL_TABLET | ORAL | Status: DC | PRN
  Administered 2019-05-26 – 2019-05-27 (×4): 10 mg via ORAL
  Filled 2019-05-26 (×4): qty 2

## 2019-05-26 MED ORDER — SODIUM CHLORIDE 0.9% FLUSH
3.0000 mL | Freq: Two times a day (BID) | INTRAVENOUS | Status: DC
  Administered 2019-05-27 – 2019-05-28 (×3): 3 mL via INTRAVENOUS

## 2019-05-26 MED ORDER — DEXAMETHASONE SODIUM PHOSPHATE 10 MG/ML IJ SOLN
10.0000 mg | Freq: Two times a day (BID) | INTRAMUSCULAR | Status: DC
  Filled 2019-05-26: qty 1

## 2019-05-26 MED ORDER — OXYCODONE HCL ER 10 MG PO T12A
10.0000 mg | EXTENDED_RELEASE_TABLET | Freq: Two times a day (BID) | ORAL | Status: DC
  Administered 2019-05-26 – 2019-05-28 (×4): 10 mg via ORAL
  Filled 2019-05-26 (×4): qty 1

## 2019-05-26 MED ORDER — ZINC SULFATE 220 (50 ZN) MG PO CAPS
220.0000 mg | ORAL_CAPSULE | Freq: Every day | ORAL | Status: DC
  Administered 2019-05-26 – 2019-05-28 (×3): 220 mg via ORAL
  Filled 2019-05-26 (×3): qty 1

## 2019-05-26 MED ORDER — ANASTROZOLE 1 MG PO TABS
1.0000 mg | ORAL_TABLET | Freq: Every day | ORAL | Status: DC
  Administered 2019-05-26 – 2019-05-28 (×3): 1 mg via ORAL
  Filled 2019-05-26 (×3): qty 1

## 2019-05-26 MED ORDER — METHOCARBAMOL 500 MG PO TABS
500.0000 mg | ORAL_TABLET | Freq: Four times a day (QID) | ORAL | Status: DC | PRN
  Administered 2019-05-26 – 2019-05-28 (×2): 500 mg via ORAL
  Filled 2019-05-26 (×2): qty 1

## 2019-05-26 MED ORDER — GABAPENTIN 300 MG PO CAPS
300.0000 mg | ORAL_CAPSULE | Freq: Three times a day (TID) | ORAL | Status: DC
  Administered 2019-05-26 – 2019-05-28 (×5): 300 mg via ORAL
  Filled 2019-05-26 (×5): qty 1

## 2019-05-26 MED ORDER — RISAQUAD PO CAPS
1.0000 | ORAL_CAPSULE | Freq: Every day | ORAL | Status: DC
  Administered 2019-05-27 – 2019-05-28 (×2): 1 via ORAL
  Filled 2019-05-26 (×2): qty 1

## 2019-05-26 MED ORDER — ZINC GLUCONATE 50 MG PO TABS: 50.0000 mg | ORAL_TABLET | Freq: Every day | ORAL | Status: DC

## 2019-05-26 MED ORDER — IOHEXOL 350 MG/ML SOLN
100.0000 mL | Freq: Once | INTRAVENOUS | Status: AC | PRN
  Administered 2019-05-26: 100 mL via INTRAVENOUS

## 2019-05-26 MED ORDER — FUROSEMIDE 20 MG PO TABS: 80.0000 mg | ORAL_TABLET | Freq: Two times a day (BID) | ORAL | Status: DC | PRN

## 2019-05-26 MED ORDER — HYDROMORPHONE HCL 1 MG/ML IJ SOLN
1.0000 mg | INTRAMUSCULAR | Status: DC | PRN
  Administered 2019-05-26: 1 mg via INTRAVENOUS
  Filled 2019-05-26: qty 1

## 2019-05-26 MED ORDER — BACLOFEN 10 MG PO TABS
10.0000 mg | ORAL_TABLET | Freq: Three times a day (TID) | ORAL | Status: DC | PRN
  Filled 2019-05-26: qty 1

## 2019-05-26 MED ORDER — SERTRALINE HCL 50 MG PO TABS
50.0000 mg | ORAL_TABLET | Freq: Two times a day (BID) | ORAL | Status: DC
  Administered 2019-05-26 – 2019-05-28 (×4): 50 mg via ORAL
  Filled 2019-05-26 (×5): qty 1

## 2019-05-26 MED ORDER — ONDANSETRON 4 MG PO TBDP: 4.0000 mg | ORAL_TABLET | Freq: Four times a day (QID) | ORAL | Status: DC | PRN

## 2019-05-26 MED ORDER — SODIUM CHLORIDE 0.9 % IV SOLN: INTRAVENOUS | Status: DC

## 2019-05-26 MED ORDER — IPRATROPIUM-ALBUTEROL 0.5-2.5 (3) MG/3ML IN SOLN: 3.0000 mL | RESPIRATORY_TRACT | Status: DC | PRN

## 2019-05-26 MED ORDER — SALINE SPRAY 0.65 % NA SOLN
1.0000 | Freq: Four times a day (QID) | NASAL | Status: DC | PRN
  Filled 2019-05-26: qty 44

## 2019-05-26 MED ORDER — PHENOL 1.4 % MT LIQD: 1.0000 | OROMUCOSAL | Status: DC | PRN

## 2019-05-26 MED ORDER — OXYCODONE HCL 5 MG PO TABS: 5.0000 mg | ORAL_TABLET | ORAL | Status: DC | PRN

## 2019-05-26 MED ORDER — FLEET ENEMA 7-19 GM/118ML RE ENEM: 1.0000 | ENEMA | Freq: Once | RECTAL | Status: DC | PRN

## 2019-05-26 MED ORDER — METOLAZONE 5 MG PO TABS
5.0000 mg | ORAL_TABLET | Freq: Every day | ORAL | Status: DC | PRN
  Filled 2019-05-26: qty 1

## 2019-05-26 MED ORDER — EPINEPHRINE 0.3 MG/0.3ML IJ SOAJ
0.3000 mg | Freq: Once | INTRAMUSCULAR | Status: AC
  Administered 2019-05-26: 0.3 mg via INTRAMUSCULAR
  Filled 2019-05-26: qty 0.3

## 2019-05-26 MED ORDER — ACETAMINOPHEN 325 MG PO TABS: 650.0000 mg | ORAL_TABLET | ORAL | Status: DC | PRN

## 2019-05-26 MED ORDER — OXYMETAZOLINE HCL 0.05 % NA SOLN
1.0000 | NASAL | Status: DC
  Filled 2019-05-26: qty 30

## 2019-05-26 MED ORDER — PREGABALIN 50 MG PO CAPS
150.0000 mg | ORAL_CAPSULE | Freq: Three times a day (TID) | ORAL | Status: DC
  Administered 2019-05-26 – 2019-05-28 (×5): 150 mg via ORAL
  Filled 2019-05-26 (×5): qty 1

## 2019-05-26 MED ORDER — DEXAMETHASONE SODIUM PHOSPHATE 10 MG/ML IJ SOLN: 10.0000 mg | Freq: Once | INTRAMUSCULAR | Status: DC

## 2019-05-26 MED ORDER — AZELASTINE HCL 0.1 % NA SOLN
2.0000 | Freq: Two times a day (BID) | NASAL | Status: DC
  Administered 2019-05-26 – 2019-05-28 (×4): 2 via NASAL
  Filled 2019-05-26: qty 30

## 2019-05-26 MED ORDER — ALBUTEROL SULFATE HFA 108 (90 BASE) MCG/ACT IN AERS
1.0000 | INHALATION_SPRAY | RESPIRATORY_TRACT | Status: DC | PRN
  Filled 2019-05-26: qty 6.7

## 2019-05-26 MED ORDER — UMECLIDINIUM BROMIDE 62.5 MCG/INH IN AEPB
1.0000 | INHALATION_SPRAY | Freq: Every day | RESPIRATORY_TRACT | Status: DC
  Administered 2019-05-27 – 2019-05-28 (×2): 1 via RESPIRATORY_TRACT
  Filled 2019-05-26: qty 7

## 2019-05-26 MED ORDER — BISACODYL 5 MG PO TBEC: 5.0000 mg | DELAYED_RELEASE_TABLET | Freq: Every day | ORAL | Status: DC | PRN

## 2019-05-26 MED ORDER — INSULIN ASPART 100 UNIT/ML ~~LOC~~ SOLN
0.0000 [IU] | Freq: Three times a day (TID) | SUBCUTANEOUS | Status: DC
  Administered 2019-05-26: 2 [IU] via SUBCUTANEOUS

## 2019-05-26 MED ORDER — FLUTICASONE PROPIONATE 50 MCG/ACT NA SUSP
2.0000 | Freq: Two times a day (BID) | NASAL | Status: DC
  Administered 2019-05-26 – 2019-05-28 (×4): 2 via NASAL
  Filled 2019-05-26: qty 16

## 2019-05-26 MED ORDER — DEXAMETHASONE SODIUM PHOSPHATE 10 MG/ML IJ SOLN
8.0000 mg | Freq: Four times a day (QID) | INTRAMUSCULAR | Status: AC
  Administered 2019-05-26 – 2019-05-27 (×4): 8 mg via INTRAVENOUS
  Filled 2019-05-26 (×4): qty 0.8

## 2019-05-26 MED ORDER — MENTHOL 3 MG MT LOZG
1.0000 | LOZENGE | OROMUCOSAL | Status: DC | PRN
  Filled 2019-05-26: qty 9

## 2019-05-26 NOTE — Progress Notes (Signed)
     Subjective:   58 year old morbidly obese male with history of recent POD#3 ADCF over 2 levels, C5-6 and C6-7 for severe cervical stenosis. He was discharged on POD#1 And has had increasing SOB and dsyphagia to where he is not able to swallow water or medicatiions, history of diabetes and recent surgery are concerning for pain control and oral antihyperglycemic medication use. He is awake and alert and in no acute distress.   Patient reports pain as moderate.    Objective:   VITALS:  Temp:  [99.7 F (37.6 C)] 99.7 F (37.6 C) (12/31 0516) Pulse Rate:  [106] 106 (12/31 0509) Resp:  [20] 20 (12/31 0509) BP: (102-140)/(77-86) 102/77 (12/31 1100) SpO2:  [95 %] 95 % (12/31 0509) Weight:  [154.2 kg] 154.2 kg (12/31 0513)  Neurologically intact ABD soft Neurovascular intact Sensation intact distally Intact pulses distally Dorsiflexion/Plantar flexion intact Incision: dressing C/D/I, no drainage and minimal anterior neck swelling, no hematoma or fluctuance. Compartment soft   LABS Recent Labs    05/26/19 0513 05/26/19 0600  HGB 13.9 14.6  WBC 12.7*  --   PLT 234  --    Recent Labs    05/26/19 0513 05/26/19 0600  NA 136 135  K 4.0 4.1  CL 97* 96*  CO2 27  --   BUN 10 11  CREATININE 0.89 0.80  GLUCOSE 121* 115*   No results for input(s): LABPT, INR in the last 72 hours.   Assessment/Plan:   CT scan with prevertebral soft tissue swelling consistent with tracheomalacia and anterior cervical spine surgery. History of angioedema and epi pen use in the past he may be more prone to local soft tissue edema. Will admit for observation of respiratory status and for prevertebral soft tissue swelling. Started on decadron and Will continue with 8 mg decadron every 6 hour for 24 hours then change to medrol dose pak as his dsyphagia improves.  Advance diet Up with therapy  Steroid  Consider Racemic epi breathing treatments if he does not improve.  Speech therapy  evaluation. Consider for tube feeding if no improvement over next 24 hours.   [name] 05/26/2019, 2:09 PM

## 2019-05-26 NOTE — Consult Note (Signed)
Reason for Consult: Pharyngeal edema Referring Physician: ER  Mark Harrell is an 58 y.o. male.  HPI: 58 year old male underwent anterior cervical spine surgery three days ago.  He has noticed difficulty swallowing and breathing since surgery that has worsened.  He has only been able to swallow small amounts of liquid.  He had particular trouble breathing last night, better now.  He came to the ER and is being admitted for IV steroids after a CT scan demonstrated retropharyngeal edema.  Past Medical History:  Diagnosis Date  . Anesthesia complication requiring reversal agent administration    ? from central apnea, very difficult to get off vent  . Anxiety   . Arthritis    osteo  . Asthma   . BPH (benign prostatic hyperplasia)   . Complication of anesthesia    difficulty waking , they twlight me because of my respiratory problems "  . Depression   . Dyspnea    on exertion  . Enlarged heart   . Family history of adverse reaction to anesthesia    mother trouble waking up, and heart stopped  . GERD (gastroesophageal reflux disease)   . Headache    botox injections for headaches  . Hyperlipidemia   . Hypertension   . Hypogonadism male   . IBS (irritable bowel syndrome)   . Memory difficulties    short term memories  . Obesity   . On home oxygen therapy    on 2 liter  . OSA (obstructive sleep apnea)    not using cpap  . Pneumonia   . Pre-diabetes   . Prostatitis     Past Surgical History:  Procedure Laterality Date  . ABDOMINAL SURGERY    . ANKLE FRACTURE SURGERY Right   . ANTERIOR CERVICAL DECOMP/DISCECTOMY FUSION N/A 05/23/2019   Procedure: ANTERIOR CERVICAL DISCECTOMY FUSION CERVICAL FIVE THROUGH CERVICAL SIX AND CERVICAL SIX THROUGH CERVICAL SEVEN;  Surgeon: Jessy Oto, MD;  Location: [facility];  Service: Orthopedics;  Laterality: N/A;  . COLONOSCOPY    . CYSTOSCOPY     [place]  . KNEE ARTHROSCOPY WITH MEDIAL MENISECTOMY Left 01/02/2017   Procedure: LEFT KNEE  ARTHROSCOPY WITH PARTIAL MEDIAL MENISCECTOMY;  Surgeon: Mcarthur Rossetti, MD;  Location: [facility];  Service: Orthopedics;  Laterality: Left;  . TONSILLECTOMY    . [place] RESECTION  2007  . UVULOPALATOPHARYNGOPLASTY      Family History  Problem Relation Age of Onset  . Diabetes Paternal Uncle   . Cancer Father        lymphoma, colon  . Diabetes Maternal Grandmother   . Heart disease Maternal Grandfather   . Diabetes Maternal Grandfather   . Diabetes Paternal Grandmother   . Diabetes Paternal Grandfather   . Dementia Mother   . Prostate cancer Maternal Uncle   . Lung disease Neg Hx   . Rheumatologic disease Neg Hx     Social History:  reports that he has quit smoking. He has a 1.50 pack-year smoking history. He has never used smokeless tobacco. He reports current alcohol use of about 1.0 standard drinks of alcohol per week. He reports that he does not use drugs.  Allergies:  Allergies  Allergen Reactions  . Bee Venom Swelling  . Duloxetine Shortness Of Breath    Brought on asthma  . Ppd [Tuberculin Purified Protein Derivative] Other (See Comments)    +ppd NEG Quantferron Gold 3/13 (shows false positive)   . Fenofibrate Other (See Comments)    Back pain  .  Verapamil Other (See Comments)    Back pain  . Claritin [Loratadine] Other (See Comments)    UNKNOWN   . Levofloxacin Diarrhea  . Other Diarrhea    "Some antibiotics" cause diarrhea    Medications: I have reviewed the patient's current medications.  Results for orders placed or performed during the hospital encounter of 05/26/19 (from the past 48 hour(s))  CBC w/ Diff     Status: Abnormal   Collection Time: 05/26/19  5:13 AM  Result Value Ref Range   WBC 12.7 (H) 4.0 - 10.5 K/uL   RBC 4.68 4.22 - 5.81 MIL/uL   Hemoglobin 13.9 13.0 - 17.0 g/dL   HCT 43.2 39.0 - 52.0 %   MCV 92.3 80.0 - 100.0 fL   MCH 29.7 26.0 - 34.0 pg   MCHC 32.2 30.0 - 36.0 g/dL   RDW 13.7 11.5 - 15.5 %   Platelets 234 150 - 400 K/uL    nRBC 0.0 0.0 - 0.2 %   Neutrophils Relative % 59 %   Neutro Abs 7.5 1.7 - 7.7 K/uL   Lymphocytes Relative 26 %   Lymphs Abs 3.4 0.7 - 4.0 K/uL   Monocytes Relative 12 %   Monocytes Absolute 1.5 (H) 0.1 - 1.0 K/uL   Eosinophils Relative 1 %   Eosinophils Absolute 0.2 0.0 - 0.5 K/uL   Basophils Relative 1 %   Basophils Absolute 0.1 0.0 - 0.1 K/uL   Immature Granulocytes 1 %   Abs Immature Granulocytes 0.06 0.00 - 0.07 K/uL    Comment: Performed at [facility], [address] [place], [place] [address]  CMP     Status: Abnormal   Collection Time: 05/26/19  5:13 AM  Result Value Ref Range   Sodium 136 135 - 145 mmol/L   Potassium 4.0 3.5 - 5.1 mmol/L   Chloride 97 (L) 98 - 111 mmol/L   CO2 27 22 - 32 mmol/L   Glucose, Bld 121 (H) 70 - 99 mg/dL   BUN 10 6 - 20 mg/dL   Creatinine, Ser 0.89 0.61 - 1.24 mg/dL   Calcium 8.4 (L) 8.9 - 10.3 mg/dL   Total Protein 6.0 (L) 6.5 - 8.1 g/dL   Albumin 3.1 (L) 3.5 - 5.0 g/dL   AST 29 15 - 41 U/L   ALT 19 0 - 44 U/L   Alkaline Phosphatase 45 38 - 126 U/L   Total Bilirubin 2.3 (H) 0.3 - 1.2 mg/dL   GFR calc non Af Amer >60 >60 mL/min   GFR calc Af Amer >60 >60 mL/min   Anion gap 12 5 - 15    Comment: Performed at [facility], [place] [address], [place], [place] [address]  Lactic acid, plasma     Status: Abnormal   Collection Time: 05/26/19  5:13 AM  Result Value Ref Range   Lactic Acid, Venous 2.1 (HH) 0.5 - 1.9 mmol/L    Comment: CRITICAL RESULT CALLED TO, READ BACK BY AND VERIFIED WITH: CAMPBELL T,RN 05/26/19 0625 WAYK Performed at [facility] [address] [place], [place] [address]   Sedimentation rate     Status: Abnormal   Collection Time: 05/26/19  5:13 AM  Result Value Ref Range   Sed Rate 40 (H) 0 - 16 mm/hr    Comment: Performed at [facility] [address], [place], [place] [address]  I-stat chem 8, ED (not at [facility] or [facility])     Status: Abnormal  Collection Time: 05/26/19  6:00 AM  Result Value  Ref Range   Sodium 135 135 - 145 mmol/L   Potassium 4.1 3.5 - 5.1 mmol/L   Chloride 96 (L) 98 - 111 mmol/L   BUN 11 6 - 20 mg/dL   Creatinine, Ser 0.80 0.61 - 1.24 mg/dL   Glucose, Bld 115 (H) 70 - 99 mg/dL   Calcium, Ion 1.02 (L) 1.15 - 1.40 mmol/L   TCO2 33 (H) 22 - 32 mmol/L   Hemoglobin 14.6 13.0 - 17.0 g/dL   HCT 43.0 39.0 - 52.0 %  Lactic acid, plasma     Status: None   Collection Time: 05/26/19  7:52 AM  Result Value Ref Range   Lactic Acid, Venous 1.0 0.5 - 1.9 mmol/L    Comment: Performed at [facility] [address] [place], [place] [address]    CT Soft Tissue Neck W Contrast  Result Date: 05/26/2019 CLINICAL DATA:  Short of breath and trouble swallowing. ACDF 05/23/2019 EXAM: CT NECK WITH CONTRAST TECHNIQUE: Multidetector CT imaging of the neck was performed using the standard protocol following the bolus administration of intravenous contrast. CONTRAST:  121mL OMNIPAQUE IOHEXOL 350 MG/ML SOLN COMPARISON:  MRI cervical spine 04/17/2018.  CT chest 01/04/2017 FINDINGS: Pharynx and larynx: Retropharyngeal soft tissue swelling displaces the pharynx anteriorly. This extends from C2 through C4. Recent ACDF C5-6 and C6-7. Pharyngeal airway patent. Epiglottis and larynx normal. Proximal trachea is significantly narrowed. There is anterior bowing of the posterior wall of the trachea by the esophagus. Tracheal luminal narrowing is greater than 50%. Salivary glands: No inflammation, mass, or stone. Thyroid: Normal thyroid. Soft tissue swelling around the left lobe of the thyroid from recent ACDF surgery. Lymph nodes: No enlarged lymph nodes in the neck. Vascular: Normal vascular enhancement. Limited intracranial: Negative Visualized orbits: Not imaged Mastoids and visualized paranasal sinuses: Negative Skeleton: ACDF C5-6 and C6-7. Hardware interbody bone graft in good position. No acute skeletal abnormality. Normal alignment. Upper chest: Lung apices clear bilaterally. Other: None  IMPRESSION: Moderate prevertebral soft tissue swelling C2 through C4. This presumably related to recent ACDF C5-6 and C6-7. The pharynx is displaced anteriorly without significant compromise of the pharyngeal airway. ACDF C5-6 and C6-7. Hardware in good position. Mild soft tissue swelling in the left neck from recent surgery. Severe tracheomalacia with flattening of the posterior wall of the trachea. Electronically Signed   By: Franchot Gallo M.D.   On: 05/26/2019 08:07   CT Angio Chest PE W and/or Wo Contrast  Result Date: 05/26/2019 CLINICAL DATA:  Shortness of breath.  Recent cervical spine surgery EXAM: CT ANGIOGRAPHY CHEST WITH CONTRAST TECHNIQUE: Multidetector CT imaging of the chest was performed using the standard protocol during bolus administration of intravenous contrast. Multiplanar CT image reconstructions and MIPs were obtained to evaluate the vascular anatomy. CONTRAST:  122mL OMNIPAQUE IOHEXOL 350 MG/ML SOLN COMPARISON:  Chest radiograph May 26, 2019; CT angiogram chest January 04, 2017 FINDINGS: Cardiovascular: There is no demonstrable pulmonary embolus. There is no appreciable thoracic aortic aneurysm or dissection. The visualized great vessels appear unremarkable. There is no pericardial effusion or pericardial thickening. There are occasional foci of coronary artery calcification. The main pulmonary outflow tract is enlarged, measuring 4.2 cm in diameter. Mediastinum/Nodes: Foci of air in the left neck region may well be of postoperative etiology. Visualized thyroid appears unremarkable. There is moderate anterior mediastinal fat. There are subcentimeter mediastinal lymph nodes but no frank adenopathy by size criteria. There are areas of  esophageal thickening, most notably involving the upper thoracic region of the esophagus. Esophageal thickening impresses upon the posterior aspect of the trachea. Lungs/Pleura: There are scattered areas of atelectatic change. There is no edema or  consolidation. No pleural effusions are evident. There is a 4 mm nodular opacity in the anterior segment right upper lobe seen on axial slice 55 series 7. Upper Abdomen: Visualized upper abdominal structures appear unremarkable. Musculoskeletal: There is degenerative change in the thoracic spine with diffuse idiopathic skeletal hyperostosis. There is incomplete visualization of postoperative change in the lower cervical region. No blastic or lytic bone lesions are evident. No chest wall lesions are identified. Review of the MIP images confirms the above findings. IMPRESSION: 1. No demonstrable pulmonary embolus. No thoracic aortic aneurysm or dissection. There are occasional foci of coronary artery calcification. 2. There is enlargement of the main pulmonary outflow tract, a finding indicative of pulmonary arterial hypertension. 3. Areas of mild atelectasis. No lung edema or consolidation. 4 mm nodular opacity noted in the anterior segment right upper lobe. No follow-up needed if patient is low-risk. Non-contrast chest CT can be considered in 12 months if patient is high-risk. This recommendation follows the consensus statement: Guidelines for Management of Incidental Pulmonary Nodules Detected on CT Images: From the Fleischner Society 2017; Radiology 2017; 284:228-243. 4. Esophageal wall thickening at several sites, most notably in the upper thoracic region. This is a finding that may warrant direct visualization of the esophagus to further assess. 5. Mild soft tissue air noted in the left lower neck region, likely of postoperative etiology given recent cervical spine surgery. 6.  Diffuse idiopathic skeletal hyperostosis in the thoracic spine. Electronically Signed   By: Lowella Grip III M.D.   On: 05/26/2019 08:05   DG Chest Port 1 View  Result Date: 05/26/2019 CLINICAL DATA:  Shortness of breath EXAM: PORTABLE CHEST 1 VIEW COMPARISON:  05/18/2019 FINDINGS: Cardiopericardial enlargement at least  partially related to mediastinal fat. There is no edema, consolidation, effusion, or pneumothorax. Degenerative endplate spurring and ACDF hardware. IMPRESSION: No active disease. Electronically Signed   By: Monte Fantasia M.D.   On: 05/26/2019 06:29    Review of Systems  HENT: Positive for trouble swallowing.   Respiratory: Positive for shortness of breath.   Musculoskeletal: Positive for neck pain.  All other systems reviewed and are negative.  Blood pressure 102/77, pulse (!) 106, temperature 99.7 F (37.6 C), temperature source Oral, resp. rate 20, height 6' (1.829 m), weight (!) 154.2 kg, SpO2 95 %. Physical Exam  Constitutional: He is oriented to person, place, and time. He appears well-developed and well-nourished. No distress.  HENT:  Head: Normocephalic and atraumatic.  Right Ear: External ear normal.  Left Ear: External ear normal.  Nose: Nose normal.  Mouth/Throat: Oropharynx is clear and moist.  Voice slightly raspy.  No stridor or respiratory distress.  Eyes: Pupils are equal, round, and reactive to light. Conjunctivae and EOM are normal.  Neck:  Hard collar in place, left incision dressed.  Cardiovascular: Normal rate.  Respiratory: Effort normal.  Neurological: He is alert and oriented to person, place, and time. No cranial nerve deficit.  Skin: Skin is warm and dry.  Psychiatric: He has a normal mood and affect. His behavior is normal. Judgment and thought content normal.    Assessment/Plan: Retropharyngeal edema related to cervical spine surgery, respiratory distress, dysphagia  I personally reviewed his neck CT demonstrating retropharyngeal edema at the level of the cervical hardware, no fluid collection.  He  has more distal tracheomalacia as well.  Currently, he does not have stridor or distress.  He is being admitted for IV steroids and supportive care.  He does not have need for airway intervention at this time.  He should be fine for discharge when symptoms are  clearly improving, perhaps with an oral steroid taper.  Melida Quitter 05/26/2019, 12:48 PM

## 2019-05-26 NOTE — ED Provider Notes (Signed)
[facility] Provider Note  CSN: [id number] Arrival date & time: 05/26/19 0501  Chief Complaint(s) Neck Pain, Shortness of Breath, and Dysphagia  HPI Mark Harrell is a 58 y.o. male with a history listed below including cervical stenosis status post ACDF 2 days ago who presents to the emergency department with gradually worsening shortness of breath that has not improved with home albuterol inhalers or nebulizers.  He reports that he is doing inhalers and nebulizers every 1-2 hours.  Patient reports that the shortness of breath fluctuates.  It is exacerbated with cough.  He is endorsing chest congestion but inability to cough up sputum.  Patient also reports difficulty swallowing and odynophagia.  Reports that he feels as though he might be aspirating as well when he tries to swallow.  Denies any known fevers.  He endorses chest pain only with coughing.  No abdominal pain.  No other physical complaints.  HPI  Past Medical History Past Medical History:  Diagnosis Date  . Anesthesia complication requiring reversal agent administration    ? from central apnea, very difficult to get off vent  . Anxiety   . Arthritis    osteo  . Asthma   . BPH (benign prostatic hyperplasia)   . Complication of anesthesia    difficulty waking , they twlight me because of my respiratory problems "  . Depression   . Dyspnea    on exertion  . Enlarged heart   . Family history of adverse reaction to anesthesia    mother trouble waking up, and heart stopped  . GERD (gastroesophageal reflux disease)   . Headache    botox injections for headaches  . Hyperlipidemia   . Hypertension   . Hypogonadism male   . IBS (irritable bowel syndrome)   . Memory difficulties    short term memories  . Obesity   . On home oxygen therapy    on 2 liter  . OSA (obstructive sleep apnea)    not using cpap  . Pneumonia   . Pre-diabetes   . Prostatitis    Patient Active Problem List     Diagnosis Date Noted  . HNP (herniated nucleus pulposus) with myelopathy, cervical 05/23/2019    Class: Chronic  . Spinal stenosis of cervical region 05/23/2019    Class: Chronic  . Status post cervical spinal fusion 05/23/2019  . Chronic respiratory failure with hypoxia ([place]) 04/12/2018  . Bilateral lower extremity edema 12/29/2017  . Atypical chest pain 12/29/2017  . Hymenoptera allergy 11/10/2017  . Diverticulosis 08/07/2017  . Family history of colonic polyps 08/07/2017  . Flatus 08/07/2017  . Myofascial pain 08/06/2017  . Seasonal and perennial allergic rhinitis 04/23/2017  . 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 Cervicalgia 04/01/2017  . Cervical spondylosis with radiculopathy 02/09/2017  . Memory difficulty 12/05/2016  . Morbid obesity ([place]) 12/05/2016  . Diarrhea in adult patient 12/03/2016  . Internal hemorrhoids 12/03/2016  . Headache 11/13/2016  . Migraine without aura and without status migrainosus, not intractable 10/30/2016  . Irritant contact dermatitis due to frequent handwashing 06/08/2016  . Lumbar radiculopathy 05/15/2016  . Osteoarthritis of spine with radiculopathy, lumbar region 05/15/2016  . Risk for falls 05/15/2016  . Acute medial meniscal tear, left, subsequent encounter 04/23/2016  . Recurrent infections 03/29/2016  . Chronic nonseasonal allergic rhinitis due to pollen 03/29/2016  . Polypharmacy 01/16/2016  . Morbid obesity with BMI of 50.0-59.9, adult ([place]) 03/06/2015  . SDAT 02/05/2015  . Asthma 02/05/2015  .  OSA and COPD overlap syndrome ([place]) 02/05/2015  . Medication management 08/02/2014  . GERD (gastroesophageal reflux disease) 05/09/2014  . Vitamin D deficiency 08/01/2013  . Prediabetes 08/01/2013  . 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 Diverticula of colon 05/07/2011  . Hypertension 01/31/2011  . Hyperlipidemia, mixed 01/31/2011  . BPH (benign prostatic hyperplasia) 01/31/2011  . Testosterone Deficiency 01/31/2011  . IBS (irritable bowel  syndrome) 01/31/2011  . Partial complex seizure disorder with intractable epilepsy ([place]) 01/31/2011  . Depression, major, recurrent, in partial remission ([place]) 01/31/2011  . Asthma-COPD overlap syndrome ([place]) 01/31/2011   Home Medication(s) Prior to Admission medications   Medication Sig Start Date End Date Taking? Authorizing Provider  acetaminophen (TYLENOL) 500 MG tablet Take 1,000 mg by mouth every 6 (six) hours as needed for moderate pain.    [provider]  albuterol (PROVENTIL HFA;VENTOLIN HFA) 108 (90 Base) MCG/ACT inhaler 1 to 2 inhalations 10 to 15 minutes apart every 4 hrs to rescue Asthma Patient taking differently: Inhale 1-2 puffs into the lungs every 4 (four) hours as needed for wheezing or shortness of breath.  02/24/18   [name], MD  alfuzosin (UROXATRAL) 10 MG 24 hr tablet Take 10 mg by mouth at bedtime.     [provider]  anastrozole (ARIMIDEX) 1 MG tablet Take 1 mg by mouth daily.  08/08/15   [provider]  ascorbic acid (VITAMIN C) 500 MG tablet Take 500 mg by mouth daily as needed (immune support).     [provider]  azelastine (ASTELIN) 0.1 % nasal spray Use 1 to 2 sprays  Each Nostril 2 x /day Patient taking differently: Place 2 sprays into both nostrils 2 (two) times daily.  12/29/18   [name], MD  Azelastine-Fluticasone [id number] MCG/ACT SUSP 2 spray each nostril 1-2 times daily Patient not taking: Reported on 05/17/2019 05/03/19   [name], MD  baclofen (LIORESAL) 10 MG tablet Take 10 mg by mouth 3 (three) times daily as needed for muscle spasms.  03/03/16   [provider]  bisoprolol-hydrochlorothiazide (ZIAC) 5-6.25 MG tablet Take 1 tablet Daily for BP Patient taking differently: Take 1 tablet by mouth daily.  01/25/19   [name], MD  Botulinum Toxin Type A 200 units SOLR Inject 1 each into the skin every 3 (three) months.  09/07/14   [provider]  cetaphil (CETAPHIL)  lotion Apply topically 2 (two) times daily. Patient taking differently: Apply 1 application topically 2 (two) times daily.  06/09/16   Lavina Hamman, MD  cetirizine (ZYRTEC) 10 MG tablet Take 10 mg by mouth daily.    [provider]  Cholecalciferol (VITAMIN D3) 250 MCG (10000 UT) capsule Take 10,000 Units by mouth 3 (three) times daily.    [provider]  CINNAMON PO Take 1,000 mg by mouth 3 (three) times daily.    [provider]  clotrimazole-betamethasone (LOTRISONE) cream APPLY TO AFFECTED AREA TWICE A DAY Patient taking differently: Apply 1 application topically 2 (two) times daily as needed (rash).  05/27/17   Vicie Mutters, PA-C  Coenzyme Q10 (COQ-10) 400 MG CAPS Take 400 mg by mouth daily.    [provider]  cyclobenzaprine (FLEXERIL) 10 MG tablet Take 10 mg by mouth 3 (three) times daily.     [provider]  diazepam (VALIUM) 5 MG tablet Take one tablet prior to MRI and may repeat times one after 30 minutes. 04/28/19   Jessy Oto, MD  diclofenac sodium (VOLTAREN)  1 % GEL Apply 2 to 4 grams Topically 2 to 4 x /day for Pain & Inflammation Patient taking differently: Apply 2-4 g topically 4 (four) times daily as needed (pain).  12/29/18   [name], MD  donepezil (ARICEPT) 23 MG TABS tablet Take 23 mg by mouth at bedtime.    [provider]  doxycycline (VIBRAMYCIN) 100 MG capsule TAKE 1 CAPSULE BY MOUTH TWICE A DAY Patient taking differently: Take 100 mg by mouth 2 (two) times daily.  01/14/19   [name], MD  DUPIXENT 300 MG/2ML SOSY RX1: INJECT 2 SYRINGES UNDER THE SKIN (SUBCUTANEOUS INJECTION) ON DAY 1 , THEN 1 SYRINGE ON DAY 15 AND EVERY OTHER WEEK THEREAFTER Patient taking differently: Inject 300 mg into the skin every 14 (fourteen) days.  11/17/18   [name], MD  Eluxadoline (VIBERZI) 100 MG TABS Take 100 mg by mouth 2 (two) times daily.     [provider]  EPINEPHrine 0.3 mg/0.3 mL IJ  SOAJ injection Inject 0.3 mLs (0.3 mg total) into the muscle as needed. Patient taking differently: Inject 0.3 mg into the muscle as needed for anaphylaxis.  05/03/19   [name], MD  fluticasone [facility]) 50 MCG/ACT nasal spray Use 1 to 2 sprays each Nostril 2 x /day Patient taking differently: Place 2 sprays into both nostrils 2 (two) times daily.  12/29/18   [name], MD  Fluticasone-Umeclidin-Vilant (TRELEGY ELLIPTA) 100-62.5-25 MCG/INH AEPB Inhale 1 puff into the lungs daily. 05/03/19   [name], MD  furosemide (LASIX) 80 MG tablet TAKE 1 TO 1 & 1/2 TABLETS 2 X /DAY FOR FLUID RETENTION & SWELLING OF LEGS Patient taking differently: Take 80-120 mg by mouth 2 (two) times daily as needed for edema.  05/04/18   [name], MD  Guaifenesin [facility] MAXIMUM STRENGTH) 1200 MG TB12 Take 1,200 mg by mouth 2 (two) times daily.    [provider]  ipratropium-albuterol (DUONEB) 0.5-2.5 (3) MG/3ML SOLN Take 3 mLs by nebulization every 4 (four) hours as needed. Patient taking differently: Take 3 mLs by nebulization every 4 (four) hours as needed (shortness of breath).  11/04/18   [name], MD  KRILL OIL PO Take 1,000 mg by mouth 3 (three) times daily.     [provider]  l-methylfolate-B6-B12 (METANX) 3-35-2 MG TABS tablet Take 1 tablet by mouth daily. 06/29/18   [provider]  LUTEIN PO Take 20 mg by mouth daily.     [provider]  LYRICA 150 MG capsule Take 2 capsules (300 mg total) by mouth 2 (two) times daily. Patient taking differently: Take 150 mg by mouth 3 (three) times daily.  04/07/18   Liane Comber, NP  Magnesium 500 MG CAPS Take 500 mg by mouth daily with supper.    [provider]  memantine (NAMENDA) 10 MG tablet Take 20 mg by mouth at bedtime.     [provider]  metFORMIN (GLUCOPHAGE-XR) 500 MG 24 hr tablet Take 2 tablets 2 x /day with Breakfast & Supper  for Diabetes Patient taking  differently: Take 1,000 mg by mouth 2 (two) times daily with a meal. Breakfast and dinner 04/02/19   [name], MD  metolazone (ZAROXOLYN) 5 MG tablet Take 1 tablet daily for Fluid Retention Patient taking differently: Take 5 mg by mouth daily as needed (severe swelling).  03/19/18   [name], MD  Misc Natural Products (GLUCOS-CHONDROIT-MSM COMPLEX) TABS Take 1 tablet by mouth 3 (three) times  daily.    [provider]  montelukast (SINGULAIR) 10 MG tablet Take 1 tablet Daily for Allergies Patient taking differently: Take 10 mg by mouth at bedtime.  05/01/19   [name], MD  Multiple Minerals-Vitamins (CITRACAL MAXIMUM PLUS) TABS Take 1 tablet by mouth 3 (three) times daily.    [provider]  Multiple Vitamins-Minerals (MULTIVITAMIN WITH MINERALS) tablet Take 1 tablet by mouth daily.    [provider]  Multiple Vitamins-Minerals (PRESERVISION AREDS 2) CAPS Take 1 capsule by mouth 2 (two) times daily.    [provider]  naloxone [facility]) nasal spray 4 mg/0.1 mL Place 1 spray into the nose as needed (opioid overdose).  03/14/17   [provider]  ondansetron (ZOFRAN-ODT) 4 MG disintegrating tablet TAKE 1 TABLET (4 MG TOTAL) BY MOUTH EVERY 6 (SIX) HOURS AS NEEDED FOR NAUSEA OR VOMITING. 03/20/17   [name], MD  oxyCODONE (OXY IR/ROXICODONE) 5 MG immediate release tablet Take 1 tablet (5 mg total) by mouth every 4 (four) hours as needed for up to 7 days for moderate pain or breakthrough pain ((score 4 to 6)). 05/24/19 05/31/19  Jessy Oto, MD  oxyCODONE (OXYCONTIN) 10 mg 12 hr tablet Take 1 tablet (10 mg total) by mouth every 12 (twelve) hours. 05/24/19   Jessy Oto, MD  oxymetazoline (AFRIN) 0.05 % nasal spray Place 1 spray into both nostrils See admin instructions. 1 SPRAY PER SIDE EACH NIGHT BEFORE DYMISTA    [provider]  pantoprazole (PROTONIX) 40 MG tablet Take 1 tablet Daily for Acid Indigestion &  Reflux Patient not taking: Reported on 05/17/2019 08/13/18   [name], MD  phenol (CHLORASEPTIC) 1.4 % LIQD Use as directed 1 spray in the mouth or throat as needed for throat irritation / pain. 05/24/19   Jessy Oto, MD  potassium chloride SA (KLOR-CON M20) 20 MEQ tablet Take 1 tablet 2 x  /day for Potassium Patient taking differently: Take 20 mEq by mouth 2 (two) times daily.  04/02/19   [name], MD  pravastatin (PRAVACHOL) 40 MG tablet Take 1 tablet Daily at Bedtime for Cholesterol Patient taking differently: Take 20 mg by mouth at bedtime.  05/01/19   [name], MD  PRESCRIPTION MEDICATION Pt receives weekly allergy shots    [provider]  PROBIOTIC PRODUCT PO Take 1 capsule by mouth 3 (three) times daily.     [provider]  promethazine-dextromethorphan (PROMETHAZINE-DM) 6.25-15 MG/5ML syrup Take 5 mLs by mouth 4 (four) times daily as needed for cough.    [provider]  sertraline (ZOLOFT) 50 MG tablet Take 50 mg by mouth 2 (two) times daily.     [provider]  sodium chloride (OCEAN) 0.65 % SOLN nasal spray Place 1 spray into both nostrils 4 (four) times daily as needed for congestion. Uses each time before other nasal sprays    [provider]  tadalafil (CIALIS) 5 MG tablet Take 5 mg by mouth daily.     [provider]  trospium (SANCTURA) 20 MG tablet Take 20 mg by mouth 2 (two) times daily.  06/22/15   [provider]  TURMERIC PO Take 1 capsule by mouth 3 (three) times daily.     [provider]  zinc gluconate 50 MG tablet Take 50 mg by mouth daily.    [provider]  Past Surgical History Past Surgical History:  Procedure Laterality Date  . ABDOMINAL SURGERY    . ANKLE FRACTURE SURGERY Right   . ANTERIOR CERVICAL DECOMP/DISCECTOMY FUSION N/A  05/23/2019   Procedure: ANTERIOR CERVICAL DISCECTOMY FUSION CERVICAL FIVE THROUGH CERVICAL SIX AND CERVICAL SIX THROUGH CERVICAL SEVEN;  Surgeon: Jessy Oto, MD;  Location: [facility];  Service: Orthopedics;  Laterality: N/A;  . COLONOSCOPY    . CYSTOSCOPY     [place]  . KNEE ARTHROSCOPY WITH MEDIAL MENISECTOMY Left 01/02/2017   Procedure: LEFT KNEE ARTHROSCOPY WITH PARTIAL MEDIAL MENISCECTOMY;  Surgeon: Mcarthur Rossetti, MD;  Location: [facility];  Service: Orthopedics;  Laterality: Left;  . TONSILLECTOMY    . [place] RESECTION  2007  . UVULOPALATOPHARYNGOPLASTY     Family History Family History  Problem Relation Age of Onset  . Diabetes Paternal Uncle   . Cancer Father        lymphoma, colon  . Diabetes Maternal Grandmother   . Heart disease Maternal Grandfather   . Diabetes Maternal Grandfather   . Diabetes Paternal Grandmother   . Diabetes Paternal Grandfather   . Dementia Mother   . Prostate cancer Maternal Uncle   . Lung disease Neg Hx   . Rheumatologic disease Neg Hx     Social History Social History   Tobacco Use  . Smoking status: Former Smoker    Packs/day: 0.10    Years: 15.00    Pack years: 1.50  . Smokeless tobacco: Never Used  . Tobacco comment: significant second-hand exposure through mother  Substance Use Topics  . Alcohol use: Yes    Alcohol/week: 1.0 standard drinks    Types: 1 Glasses of wine per week    Comment: 2 x a year  . Drug use: No   Allergies Bee venom, Duloxetine, Ppd [tuberculin purified protein derivative], Fenofibrate, Verapamil, Claritin [loratadine], Levofloxacin, and Other  Review of Systems Review of Systems All other systems are reviewed and are negative for acute change except as noted in the HPI  Physical Exam Vital Signs  I have reviewed the triage vital signs BP 140/86   Pulse (!) 106   Temp 99.7 F (37.6 C) (Oral)   Resp 20   Ht 6' (1.829 m)   Wt (!) 154.2 kg   SpO2 95%   BMI 46.11 kg/m   Physical  Exam Vitals reviewed.  Constitutional:      General: He is not in acute distress.    Appearance: He is well-developed. He is not diaphoretic.     Interventions: Cervical collar in place.  HENT:     Head: Normocephalic and atraumatic.     Nose: Nose normal.  Eyes:     General: No scleral icterus.       Right eye: No discharge.        Left eye: No discharge.     Conjunctiva/sclera: Conjunctivae normal.     Pupils: Pupils are equal, round, and reactive to light.  Neck:   Cardiovascular:     Rate and Rhythm: Regular rhythm. Tachycardia present.     Heart sounds: No murmur. No friction rub. No gallop.   Pulmonary:     Effort: Pulmonary effort is normal. No respiratory distress.     Breath sounds: Normal breath sounds. No stridor. No wheezing, rhonchi or rales.  Abdominal:     General: There is no distension.     Palpations: Abdomen is soft.     Tenderness: There is no abdominal tenderness.  Musculoskeletal:  General: No tenderness.     Cervical back: Normal range of motion and neck supple.  Skin:    General: Skin is warm and dry.     Findings: No erythema or rash.  Neurological:     Mental Status: He is alert and oriented to person, place, and time.     ED Results and Treatments Labs (all labs ordered are listed, but only abnormal results are displayed) Labs Reviewed  CBC WITH DIFFERENTIAL/PLATELET - Abnormal; Notable for the following components:      Result Value   WBC 12.7 (*)    Monocytes Absolute 1.5 (*)    All other components within normal limits  COMPREHENSIVE METABOLIC PANEL - Abnormal; Notable for the following components:   Chloride 97 (*)    Glucose, Bld 121 (*)    Calcium 8.4 (*)    Total Protein 6.0 (*)    Albumin 3.1 (*)    Total Bilirubin 2.3 (*)    All other components within normal limits  LACTIC ACID, PLASMA - Abnormal; Notable for the following components:   Lactic Acid, Venous 2.1 (*)    All other components within normal limits  I-STAT  CHEM 8, ED - Abnormal; Notable for the following components:   Chloride 96 (*)    Glucose, Bld 115 (*)    Calcium, Ion 1.02 (*)    TCO2 33 (*)    All other components within normal limits  LACTIC ACID, PLASMA  SEDIMENTATION RATE                                                                                                                         EKG  EKG Interpretation  Date/Time:  Thursday May 26 2019 06:34:52 EST Ventricular Rate:  108 PR Interval:    QRS Duration: 96 QT Interval:  347 QTC Calculation: 466 R Axis:   2 Text Interpretation: Sinus tachycardia Low voltage, extremity and precordial leads Confirmed by Addison Lank [phone]) on 05/26/2019 6:41:13 AM      Radiology CT Soft Tissue Neck W Contrast  Result Date: 05/26/2019 CLINICAL DATA:  Short of breath and trouble swallowing. ACDF 05/23/2019 EXAM: CT NECK WITH CONTRAST TECHNIQUE: Multidetector CT imaging of the neck was performed using the standard protocol following the bolus administration of intravenous contrast. CONTRAST:  161mL OMNIPAQUE IOHEXOL 350 MG/ML SOLN COMPARISON:  MRI cervical spine 04/17/2018.  CT chest 01/04/2017 FINDINGS: Pharynx and larynx: Retropharyngeal soft tissue swelling displaces the pharynx anteriorly. This extends from C2 through C4. Recent ACDF C5-6 and C6-7. Pharyngeal airway patent. Epiglottis and larynx normal. Proximal trachea is significantly narrowed. There is anterior bowing of the posterior wall of the trachea by the esophagus. Tracheal luminal narrowing is greater than 50%. Salivary glands: No inflammation, mass, or stone. Thyroid: Normal thyroid. Soft tissue swelling around the left lobe of the thyroid from recent ACDF surgery. Lymph nodes: No enlarged lymph nodes in the neck. Vascular: Normal vascular enhancement. Limited intracranial: Negative Visualized orbits: Not imaged  Mastoids and visualized paranasal sinuses: Negative Skeleton: ACDF C5-6 and C6-7. Hardware interbody bone graft  in good position. No acute skeletal abnormality. Normal alignment. Upper chest: Lung apices clear bilaterally. Other: None IMPRESSION: Moderate prevertebral soft tissue swelling C2 through C4. This presumably related to recent ACDF C5-6 and C6-7. The pharynx is displaced anteriorly without significant compromise of the pharyngeal airway. ACDF C5-6 and C6-7. Hardware in good position. Mild soft tissue swelling in the left neck from recent surgery. Severe tracheomalacia with flattening of the posterior wall of the trachea. Electronically Signed   By: Franchot Gallo M.D.   On: 05/26/2019 08:07   CT Angio Chest PE W and/or Wo Contrast  Result Date: 05/26/2019 CLINICAL DATA:  Shortness of breath.  Recent cervical spine surgery EXAM: CT ANGIOGRAPHY CHEST WITH CONTRAST TECHNIQUE: Multidetector CT imaging of the chest was performed using the standard protocol during bolus administration of intravenous contrast. Multiplanar CT image reconstructions and MIPs were obtained to evaluate the vascular anatomy. CONTRAST:  120mL OMNIPAQUE IOHEXOL 350 MG/ML SOLN COMPARISON:  Chest radiograph May 26, 2019; CT angiogram chest January 04, 2017 FINDINGS: Cardiovascular: There is no demonstrable pulmonary embolus. There is no appreciable thoracic aortic aneurysm or dissection. The visualized great vessels appear unremarkable. There is no pericardial effusion or pericardial thickening. There are occasional foci of coronary artery calcification. The main pulmonary outflow tract is enlarged, measuring 4.2 cm in diameter. Mediastinum/Nodes: Foci of air in the left neck region may well be of postoperative etiology. Visualized thyroid appears unremarkable. There is moderate anterior mediastinal fat. There are subcentimeter mediastinal lymph nodes but no frank adenopathy by size criteria. There are areas of esophageal thickening, most notably involving the upper thoracic region of the esophagus. Esophageal thickening impresses upon the  posterior aspect of the trachea. Lungs/Pleura: There are scattered areas of atelectatic change. There is no edema or consolidation. No pleural effusions are evident. There is a 4 mm nodular opacity in the anterior segment right upper lobe seen on axial slice 55 series 7. Upper Abdomen: Visualized upper abdominal structures appear unremarkable. Musculoskeletal: There is degenerative change in the thoracic spine with diffuse idiopathic skeletal hyperostosis. There is incomplete visualization of postoperative change in the lower cervical region. No blastic or lytic bone lesions are evident. No chest wall lesions are identified. Review of the MIP images confirms the above findings. IMPRESSION: 1. No demonstrable pulmonary embolus. No thoracic aortic aneurysm or dissection. There are occasional foci of coronary artery calcification. 2. There is enlargement of the main pulmonary outflow tract, a finding indicative of pulmonary arterial hypertension. 3. Areas of mild atelectasis. No lung edema or consolidation. 4 mm nodular opacity noted in the anterior segment right upper lobe. No follow-up needed if patient is low-risk. Non-contrast chest CT can be considered in 12 months if patient is high-risk. This recommendation follows the consensus statement: Guidelines for Management of Incidental Pulmonary Nodules Detected on CT Images: From the Fleischner Society 2017; Radiology 2017; 284:228-243. 4. Esophageal wall thickening at several sites, most notably in the upper thoracic region. This is a finding that may warrant direct visualization of the esophagus to further assess. 5. Mild soft tissue air noted in the left lower neck region, likely of postoperative etiology given recent cervical spine surgery. 6.  Diffuse idiopathic skeletal hyperostosis in the thoracic spine. Electronically Signed   By: Lowella Grip III M.D.   On: 05/26/2019 08:05   DG Chest Port 1 View  Result Date: 05/26/2019 CLINICAL DATA:  Shortness of  breath EXAM: PORTABLE CHEST 1 VIEW COMPARISON:  05/18/2019 FINDINGS: Cardiopericardial enlargement at least partially related to mediastinal fat. There is no edema, consolidation, effusion, or pneumothorax. Degenerative endplate spurring and ACDF hardware. IMPRESSION: No active disease. Electronically Signed   By: Monte Fantasia M.D.   On: 05/26/2019 06:29    Pertinent labs & imaging results that were available during my care of the patient were reviewed by me and considered in my medical decision making (see chart for details).  Medications Ordered in ED Medications  dexamethasone (DECADRON) injection 12 mg (has no administration in time range)  HYDROmorphone (DILAUDID) injection 1 mg (1 mg Intravenous Given 05/26/19 0634)  sodium chloride 0.9 % bolus 1,000 mL (0 mLs Intravenous Stopped 05/26/19 0822)  iohexol (OMNIPAQUE) 350 MG/ML injection 100 mL (100 mLs Intravenous Contrast Given 05/26/19 0736)                                                                                                                                    Procedures Procedures  (including critical care time)  Medical Decision Making / ED Course I have reviewed the nursing notes for this encounter and the patient's prior records (if available in EHR or on provided paperwork).   Mark Harrell was evaluated in Emergency Department on 05/26/2019 for the symptoms described in the history of present illness. He was evaluated in the context of the global COVID-19 pandemic, which necessitated consideration that the patient might be at risk for infection with the SARS-CoV-2 virus that causes COVID-19. Institutional protocols and algorithms that pertain to the evaluation of patients at risk for COVID-19 are in a state of rapid change based on information released by regulatory bodies including the CDC and federal and state organizations. These policies and algorithms were followed during the patient's care in the ED.  Patient  status post ACDF with a history of COPD here for shortness of breath and difficulty swallowing.  Patient is on 3 L nasal cannula satting well.  Patient tachycardic which may be secondary to the amount of albuterol he is using.  Also considering possible infection versus pulmonary embolism.  Labs notable for leukocytosis.  Plan to obtain CT soft tissue neck and CT a PE of the chest for further evaluation.  Patient care turned over to Dr Tomi Bamberger. Patient case and results discussed in detail; please see their note for further ED managment.          Final Clinical Impression(s) / ED Diagnoses Final diagnoses:  SOB (shortness of breath)  Pulmonary nodule      This chart was dictated using voice recognition software.  Despite best efforts to proofread,  errors can occur which can change the documentation meaning.   Fatima Blank, MD 05/26/19 0930

## 2019-05-26 NOTE — Discharge Instructions (Addendum)
Incidental pulmonary nodule noted.  Repeat CT scan recommended in 12 months

## 2019-05-26 NOTE — H&P (Signed)
Mark Harrell is an 58 y.o. male.   Chief Complaint: Dysphagia HPI: Mark Harrell underwent ACDF by Dr. [name] on Monday of this week. Since then he has been suffering with increasing SOB and dysphagia. He has been using his nebs at home without much relief. This feels similar to when he needs steroids for his asthma/COPD. He also has been having trouble swallowing with pain and gagging. He denies fevers, chills, sweats, N/V.  Past Medical History:  Diagnosis Date  . Anesthesia complication requiring reversal agent administration    ? from central apnea, very difficult to get off vent  . Anxiety   . Arthritis    osteo  . Asthma   . BPH (benign prostatic hyperplasia)   . Complication of anesthesia    difficulty waking , they twlight me because of my respiratory problems "  . Depression   . Dyspnea    on exertion  . Enlarged heart   . Family history of adverse reaction to anesthesia    mother trouble waking up, and heart stopped  . GERD (gastroesophageal reflux disease)   . Headache    botox injections for headaches  . Hyperlipidemia   . Hypertension   . Hypogonadism male   . IBS (irritable bowel syndrome)   . Memory difficulties    short term memories  . Obesity   . On home oxygen therapy    on 2 liter  . OSA (obstructive sleep apnea)    not using cpap  . Pneumonia   . Pre-diabetes   . Prostatitis     Past Surgical History:  Procedure Laterality Date  . ABDOMINAL SURGERY    . ANKLE FRACTURE SURGERY Right   . ANTERIOR CERVICAL DECOMP/DISCECTOMY FUSION N/A 05/23/2019   Procedure: ANTERIOR CERVICAL DISCECTOMY FUSION CERVICAL FIVE THROUGH CERVICAL SIX AND CERVICAL SIX THROUGH CERVICAL SEVEN;  Surgeon: Jessy Oto, MD;  Location: [facility];  Service: Orthopedics;  Laterality: N/A;  . COLONOSCOPY    . CYSTOSCOPY     [place]  . KNEE ARTHROSCOPY WITH MEDIAL MENISECTOMY Left 01/02/2017   Procedure: LEFT KNEE ARTHROSCOPY WITH PARTIAL MEDIAL MENISCECTOMY;  Surgeon: Mcarthur Rossetti,  MD;  Location: [facility];  Service: Orthopedics;  Laterality: Left;  . TONSILLECTOMY    . Ackerman RESECTION  2007  . UVULOPALATOPHARYNGOPLASTY      Family History  Problem Relation Age of Onset  . Diabetes Paternal Uncle   . Cancer Father        lymphoma, colon  . Diabetes Maternal Grandmother   . Heart disease Maternal Grandfather   . Diabetes Maternal Grandfather   . Diabetes Paternal Grandmother   . Diabetes Paternal Grandfather   . Dementia Mother   . Prostate cancer Maternal Uncle   . Lung disease Neg Hx   . Rheumatologic disease Neg Hx    Social History:  reports that he has quit smoking. He has a 1.50 pack-year smoking history. He has never used smokeless tobacco. He reports current alcohol use of about 1.0 standard drinks of alcohol per week. He reports that he does not use drugs.  Allergies:  Allergies  Allergen Reactions  . Bee Venom Swelling  . Duloxetine Shortness Of Breath    Brought on asthma  . Ppd [Tuberculin Purified Protein Derivative] Other (See Comments)    +ppd NEG Quantferron Gold 3/13 (shows false positive)   . Fenofibrate Other (See Comments)    Back pain  . Verapamil Other (See Comments)    Back pain  .  Claritin [Loratadine] Other (See Comments)    UNKNOWN   . Levofloxacin Diarrhea  . Other Diarrhea    "Some antibiotics" cause diarrhea    (Not in a hospital admission)   Results for orders placed or performed during the hospital encounter of 05/26/19 (from the past 48 hour(s))  CBC w/ Diff     Status: Abnormal   Collection Time: 05/26/19  5:13 AM  Result Value Ref Range   WBC 12.7 (H) 4.0 - 10.5 K/uL   RBC 4.68 4.22 - 5.81 MIL/uL   Hemoglobin 13.9 13.0 - 17.0 g/dL   HCT 43.2 39.0 - 52.0 %   MCV 92.3 80.0 - 100.0 fL   MCH 29.7 26.0 - 34.0 pg   MCHC 32.2 30.0 - 36.0 g/dL   RDW 13.7 11.5 - 15.5 %   Platelets 234 150 - 400 K/uL   nRBC 0.0 0.0 - 0.2 %   Neutrophils Relative % 59 %   Neutro Abs 7.5 1.7 - 7.7 K/uL   Lymphocytes Relative 26  %   Lymphs Abs 3.4 0.7 - 4.0 K/uL   Monocytes Relative 12 %   Monocytes Absolute 1.5 (H) 0.1 - 1.0 K/uL   Eosinophils Relative 1 %   Eosinophils Absolute 0.2 0.0 - 0.5 K/uL   Basophils Relative 1 %   Basophils Absolute 0.1 0.0 - 0.1 K/uL   Immature Granulocytes 1 %   Abs Immature Granulocytes 0.06 0.00 - 0.07 K/uL    Comment: Performed at [facility], [address], [place], [place] [address]  CMP     Status: Abnormal   Collection Time: 05/26/19  5:13 AM  Result Value Ref Range   Sodium 136 135 - 145 mmol/L   Potassium 4.0 3.5 - 5.1 mmol/L   Chloride 97 (L) 98 - 111 mmol/L   CO2 27 22 - 32 mmol/L   Glucose, Bld 121 (H) 70 - 99 mg/dL   BUN 10 6 - 20 mg/dL   Creatinine, Ser 0.89 0.61 - 1.24 mg/dL   Calcium 8.4 (L) 8.9 - 10.3 mg/dL   Total Protein 6.0 (L) 6.5 - 8.1 g/dL   Albumin 3.1 (L) 3.5 - 5.0 g/dL   AST 29 15 - 41 U/L   ALT 19 0 - 44 U/L   Alkaline Phosphatase 45 38 - 126 U/L   Total Bilirubin 2.3 (H) 0.3 - 1.2 mg/dL   GFR calc non Af Amer >60 >60 mL/min   GFR calc Af Amer >60 >60 mL/min   Anion gap 12 5 - 15    Comment: Performed at [facility], [place] [address] [place], [place] [address]  Lactic acid, plasma     Status: Abnormal   Collection Time: 05/26/19  5:13 AM  Result Value Ref Range   Lactic Acid, Venous 2.1 (HH) 0.5 - 1.9 mmol/L    Comment: CRITICAL RESULT CALLED TO, READ BACK BY AND VERIFIED WITH: CAMPBELL T,RN 05/26/19 0625 WAYK Performed at [facility] [address] [place], [place] [address]   I-stat chem 8, ED (not at [facility] or [facility])     Status: Abnormal   Collection Time: 05/26/19  6:00 AM  Result Value Ref Range   Sodium 135 135 - 145 mmol/L   Potassium 4.1 3.5 - 5.1 mmol/L   Chloride 96 (L) 98 - 111 mmol/L   BUN 11 6 - 20 mg/dL   Creatinine, Ser 0.80 0.61 - 1.24 mg/dL   Glucose, Bld 115 (H) 70 -  99 mg/dL   Calcium, Ion 1.02 (L) 1.15 - 1.40 mmol/L   TCO2 33 (H) 22 - 32 mmol/L   Hemoglobin 14.6 13.0 - 17.0 g/dL   HCT 43.0 39.0  - 52.0 %  Lactic acid, plasma     Status: None   Collection Time: 05/26/19  7:52 AM  Result Value Ref Range   Lactic Acid, Venous 1.0 0.5 - 1.9 mmol/L    Comment: Performed at [facility] [address] [place], [place] [address]   CT Soft Tissue Neck W Contrast  Result Date: 05/26/2019 CLINICAL DATA:  Short of breath and trouble swallowing. ACDF 05/23/2019 EXAM: CT NECK WITH CONTRAST TECHNIQUE: Multidetector CT imaging of the neck was performed using the standard protocol following the bolus administration of intravenous contrast. CONTRAST:  171mL OMNIPAQUE IOHEXOL 350 MG/ML SOLN COMPARISON:  MRI cervical spine 04/17/2018.  CT chest 01/04/2017 FINDINGS: Pharynx and larynx: Retropharyngeal soft tissue swelling displaces the pharynx anteriorly. This extends from C2 through C4. Recent ACDF C5-6 and C6-7. Pharyngeal airway patent. Epiglottis and larynx normal. Proximal trachea is significantly narrowed. There is anterior bowing of the posterior wall of the trachea by the esophagus. Tracheal luminal narrowing is greater than 50%. Salivary glands: No inflammation, mass, or stone. Thyroid: Normal thyroid. Soft tissue swelling around the left lobe of the thyroid from recent ACDF surgery. Lymph nodes: No enlarged lymph nodes in the neck. Vascular: Normal vascular enhancement. Limited intracranial: Negative Visualized orbits: Not imaged Mastoids and visualized paranasal sinuses: Negative Skeleton: ACDF C5-6 and C6-7. Hardware interbody bone graft in good position. No acute skeletal abnormality. Normal alignment. Upper chest: Lung apices clear bilaterally. Other: None IMPRESSION: Moderate prevertebral soft tissue swelling C2 through C4. This presumably related to recent ACDF C5-6 and C6-7. The pharynx is displaced anteriorly without significant compromise of the pharyngeal airway. ACDF C5-6 and C6-7. Hardware in good position. Mild soft tissue swelling in the left neck from recent surgery. Severe  tracheomalacia with flattening of the posterior wall of the trachea. Electronically Signed   By: Franchot Gallo M.D.   On: 05/26/2019 08:07   CT Angio Chest PE W and/or Wo Contrast  Result Date: 05/26/2019 CLINICAL DATA:  Shortness of breath.  Recent cervical spine surgery EXAM: CT ANGIOGRAPHY CHEST WITH CONTRAST TECHNIQUE: Multidetector CT imaging of the chest was performed using the standard protocol during bolus administration of intravenous contrast. Multiplanar CT image reconstructions and MIPs were obtained to evaluate the vascular anatomy. CONTRAST:  156mL OMNIPAQUE IOHEXOL 350 MG/ML SOLN COMPARISON:  Chest radiograph May 26, 2019; CT angiogram chest January 04, 2017 FINDINGS: Cardiovascular: There is no demonstrable pulmonary embolus. There is no appreciable thoracic aortic aneurysm or dissection. The visualized great vessels appear unremarkable. There is no pericardial effusion or pericardial thickening. There are occasional foci of coronary artery calcification. The main pulmonary outflow tract is enlarged, measuring 4.2 cm in diameter. Mediastinum/Nodes: Foci of air in the left neck region may well be of postoperative etiology. Visualized thyroid appears unremarkable. There is moderate anterior mediastinal fat. There are subcentimeter mediastinal lymph nodes but no frank adenopathy by size criteria. There are areas of esophageal thickening, most notably involving the upper thoracic region of the esophagus. Esophageal thickening impresses upon the posterior aspect of the trachea. Lungs/Pleura: There are scattered areas of atelectatic change. There is no edema or consolidation. No pleural effusions are evident. There is a 4 mm nodular opacity in the anterior segment right upper lobe seen on axial slice 55 series 7.  Upper Abdomen: Visualized upper abdominal structures appear unremarkable. Musculoskeletal: There is degenerative change in the thoracic spine with diffuse idiopathic skeletal  hyperostosis. There is incomplete visualization of postoperative change in the lower cervical region. No blastic or lytic bone lesions are evident. No chest wall lesions are identified. Review of the MIP images confirms the above findings. IMPRESSION: 1. No demonstrable pulmonary embolus. No thoracic aortic aneurysm or dissection. There are occasional foci of coronary artery calcification. 2. There is enlargement of the main pulmonary outflow tract, a finding indicative of pulmonary arterial hypertension. 3. Areas of mild atelectasis. No lung edema or consolidation. 4 mm nodular opacity noted in the anterior segment right upper lobe. No follow-up needed if patient is low-risk. Non-contrast chest CT can be considered in 12 months if patient is high-risk. This recommendation follows the consensus statement: Guidelines for Management of Incidental Pulmonary Nodules Detected on CT Images: From the Fleischner Society 2017; Radiology 2017; 284:228-243. 4. Esophageal wall thickening at several sites, most notably in the upper thoracic region. This is a finding that may warrant direct visualization of the esophagus to further assess. 5. Mild soft tissue air noted in the left lower neck region, likely of postoperative etiology given recent cervical spine surgery. 6.  Diffuse idiopathic skeletal hyperostosis in the thoracic spine. Electronically Signed   By: Lowella Grip III M.D.   On: 05/26/2019 08:05   DG Chest Port 1 View  Result Date: 05/26/2019 CLINICAL DATA:  Shortness of breath EXAM: PORTABLE CHEST 1 VIEW COMPARISON:  05/18/2019 FINDINGS: Cardiopericardial enlargement at least partially related to mediastinal fat. There is no edema, consolidation, effusion, or pneumothorax. Degenerative endplate spurring and ACDF hardware. IMPRESSION: No active disease. Electronically Signed   By: Monte Fantasia M.D.   On: 05/26/2019 06:29    Review of Systems  Constitutional: Negative for chills, diaphoresis and fever.   HENT: Positive for trouble swallowing. Negative for ear discharge, ear pain, hearing loss, tinnitus and voice change.   Eyes: Negative for photophobia and pain.  Respiratory: Positive for shortness of breath. Negative for cough.   Cardiovascular: Negative for chest pain.  Gastrointestinal: Negative for abdominal pain, nausea and vomiting.  Genitourinary: Negative for dysuria, flank pain, frequency and urgency.  Musculoskeletal: Positive for neck pain. Negative for back pain and myalgias.  Neurological: Negative for dizziness and headaches.  Hematological: Does not bruise/bleed easily.  Psychiatric/Behavioral: The patient is not nervous/anxious.     Blood pressure 140/86, pulse (!) 106, temperature 99.7 F (37.6 C), temperature source Oral, resp. rate 20, height 6' (1.829 m), weight (!) 154.2 kg, SpO2 95 %. Physical Exam  Constitutional: He appears well-developed and well-nourished. No distress.  HENT:  Head: Normocephalic and atraumatic.  Eyes: Conjunctivae are normal. Right eye exhibits no discharge. Left eye exhibits no discharge. No scleral icterus.  Neck:  Aspen collar in place, incision C/D/I  Cardiovascular: Normal rate and regular rhythm.  Respiratory: Effort normal. No respiratory distress.  Musculoskeletal:        General: Normal range of motion.  Neurological: He is alert.  Skin: Skin is warm and dry. He is not diaphoretic.  Psychiatric: He has a normal mood and affect. His behavior is normal.     Assessment/Plan S/p ACDF with SOB and dysphagia -- Will admit and administer IV steroids. EDP arranging ENT consult. ST consult. Hopefully with this and elevation the swelling can be controlled and he can be discharged quickly.    Lisette Abu, PA-C Orthopedic Surgery [phone] 05/26/2019, 10:16 AM

## 2019-05-26 NOTE — Telephone Encounter (Signed)
Mark Harrell from [facility] called. They have denied the request for Oxycodone for patient. The call back number is [phone] option 3 and then 1

## 2019-05-26 NOTE — ED Provider Notes (Addendum)
CT scan findings reviewed.  Discussed results with pt.  Mod prevertebral swelling noted.  Severe tracheomalacia noted in addition to esophageal thickening.  Suspect the baseline tracheomalacia exacerbated by his surgical swelling.  Will discuss findings with his surgeon.  D/w Dr [name].  With his difficulty swallowing will need to come in for IV steroids. Requests admission. Sedrate, procalcitonin. ENT consult  Discussed with Dr Redmond Baseman.  Will consult on pt  CT Soft Tissue Neck W Contrast  Result Date: 05/26/2019 CLINICAL DATA:  Short of breath and trouble swallowing. ACDF 05/23/2019 EXAM: CT NECK WITH CONTRAST TECHNIQUE: Multidetector CT imaging of the neck was performed using the standard protocol following the bolus administration of intravenous contrast. CONTRAST:  140mL OMNIPAQUE IOHEXOL 350 MG/ML SOLN COMPARISON:  MRI cervical spine 04/17/2018.  CT chest 01/04/2017 FINDINGS: Pharynx and larynx: Retropharyngeal soft tissue swelling displaces the pharynx anteriorly. This extends from C2 through C4. Recent ACDF C5-6 and C6-7. Pharyngeal airway patent. Epiglottis and larynx normal. Proximal trachea is significantly narrowed. There is anterior bowing of the posterior wall of the trachea by the esophagus. Tracheal luminal narrowing is greater than 50%. Salivary glands: No inflammation, mass, or stone. Thyroid: Normal thyroid. Soft tissue swelling around the left lobe of the thyroid from recent ACDF surgery. Lymph nodes: No enlarged lymph nodes in the neck. Vascular: Normal vascular enhancement. Limited intracranial: Negative Visualized orbits: Not imaged Mastoids and visualized paranasal sinuses: Negative Skeleton: ACDF C5-6 and C6-7. Hardware interbody bone graft in good position. No acute skeletal abnormality. Normal alignment. Upper chest: Lung apices clear bilaterally. Other: None IMPRESSION: Moderate prevertebral soft tissue swelling C2 through C4. This presumably related to recent ACDF C5-6 and C6-7. The  pharynx is displaced anteriorly without significant compromise of the pharyngeal airway. ACDF C5-6 and C6-7. Hardware in good position. Mild soft tissue swelling in the left neck from recent surgery. Severe tracheomalacia with flattening of the posterior wall of the trachea. Electronically Signed   By: Franchot Gallo M.D.   On: 05/26/2019 08:07   CT Angio Chest PE W and/or Wo Contrast  Result Date: 05/26/2019 CLINICAL DATA:  Shortness of breath.  Recent cervical spine surgery EXAM: CT ANGIOGRAPHY CHEST WITH CONTRAST TECHNIQUE: Multidetector CT imaging of the chest was performed using the standard protocol during bolus administration of intravenous contrast. Multiplanar CT image reconstructions and MIPs were obtained to evaluate the vascular anatomy. CONTRAST:  180mL OMNIPAQUE IOHEXOL 350 MG/ML SOLN COMPARISON:  Chest radiograph May 26, 2019; CT angiogram chest January 04, 2017 FINDINGS: Cardiovascular: There is no demonstrable pulmonary embolus. There is no appreciable thoracic aortic aneurysm or dissection. The visualized great vessels appear unremarkable. There is no pericardial effusion or pericardial thickening. There are occasional foci of coronary artery calcification. The main pulmonary outflow tract is enlarged, measuring 4.2 cm in diameter. Mediastinum/Nodes: Foci of air in the left neck region may well be of postoperative etiology. Visualized thyroid appears unremarkable. There is moderate anterior mediastinal fat. There are subcentimeter mediastinal lymph nodes but no frank adenopathy by size criteria. There are areas of esophageal thickening, most notably involving the upper thoracic region of the esophagus. Esophageal thickening impresses upon the posterior aspect of the trachea. Lungs/Pleura: There are scattered areas of atelectatic change. There is no edema or consolidation. No pleural effusions are evident. There is a 4 mm nodular opacity in the anterior segment right upper lobe seen on  axial slice 55 series 7. Upper Abdomen: Visualized upper abdominal structures appear unremarkable. Musculoskeletal: There is degenerative change in the  thoracic spine with diffuse idiopathic skeletal hyperostosis. There is incomplete visualization of postoperative change in the lower cervical region. No blastic or lytic bone lesions are evident. No chest wall lesions are identified. Review of the MIP images confirms the above findings. IMPRESSION: 1. No demonstrable pulmonary embolus. No thoracic aortic aneurysm or dissection. There are occasional foci of coronary artery calcification. 2. There is enlargement of the main pulmonary outflow tract, a finding indicative of pulmonary arterial hypertension. 3. Areas of mild atelectasis. No lung edema or consolidation. 4 mm nodular opacity noted in the anterior segment right upper lobe. No follow-up needed if patient is low-risk. Non-contrast chest CT can be considered in 12 months if patient is high-risk. This recommendation follows the consensus statement: Guidelines for Management of Incidental Pulmonary Nodules Detected on CT Images: From the Fleischner Society 2017; Radiology 2017; 284:228-243. 4. Esophageal wall thickening at several sites, most notably in the upper thoracic region. This is a finding that may warrant direct visualization of the esophagus to further assess. 5. Mild soft tissue air noted in the left lower neck region, likely of postoperative etiology given recent cervical spine surgery. 6.  Diffuse idiopathic skeletal hyperostosis in the thoracic spine. Electronically Signed   By: Lowella Grip III M.D.   On: 05/26/2019 08:05   DG Chest Port 1 View  Result Date: 05/26/2019 CLINICAL DATA:  Shortness of breath EXAM: PORTABLE CHEST 1 VIEW COMPARISON:  05/18/2019 FINDINGS: Cardiopericardial enlargement at least partially related to mediastinal fat. There is no edema, consolidation, effusion, or pneumothorax. Degenerative endplate spurring and ACDF  hardware. IMPRESSION: No active disease. Electronically Signed   By: Monte Fantasia M.D.   On: 05/26/2019 06:29      Dorie Rank, MD 05/26/19 0930    Dorie Rank, MD 05/26/19 [phone]

## 2019-05-26 NOTE — ED Triage Notes (Signed)
Pt arrives via [facility] c/o dysphagia, dyspnea, and neck pain that has progressively worsened since having cervical surgery on Monday. A&Ox4, GCS 15. VS WNL. Lung sounds revealed mild coarse crackles bilaterally. Pt also presents with productive cough.

## 2019-05-26 NOTE — ED Notes (Signed)
Patient transported to CT 

## 2019-05-26 NOTE — ED Notes (Signed)
Pt eating clear liquid diet.

## 2019-05-26 NOTE — ED Notes (Signed)
Pt ambulated to bathroom with assistance.  When he returned he was diaphoretic.  Sys bp was 88.  Pt notified he had to use bedside commode.

## 2019-05-27 LAB — BASIC METABOLIC PANEL
Anion gap: 10 (ref 5–15)
BUN: 11 mg/dL (ref 6–20)
CO2: 27 mmol/L (ref 22–32)
Calcium: 8.8 mg/dL — ABNORMAL LOW (ref 8.9–10.3)
Chloride: 101 mmol/L (ref 98–111)
Creatinine, Ser: 0.63 mg/dL (ref 0.61–1.24)
GFR calc Af Amer: >60 mL/min (ref >60)
GFR calc non Af Amer: >60 mL/min (ref >60)
Glucose, Bld: 145 mg/dL — ABNORMAL HIGH (ref 70–99)
Potassium: 4 mmol/L (ref 3.5–5.1)
Sodium: 138 mmol/L (ref 135–145)

## 2019-05-27 LAB — SARS CORONAVIRUS 2 (TAT 6-24 HRS): SARS Coronavirus 2: NEGATIVE

## 2019-05-27 LAB — CBC
HCT: 41.1 % (ref 39.0–52.0)
Hemoglobin: 13.4 g/dL (ref 13.0–17.0)
MCH: 29.6 pg (ref 26.0–34.0)
MCHC: 32.6 g/dL (ref 30.0–36.0)
MCV: 90.9 fL (ref 80.0–100.0)
Platelets: 265 10*3/uL (ref 150–400)
RBC: 4.52 MIL/uL (ref 4.22–5.81)
RDW: 13.3 % (ref 11.5–15.5)
WBC: 10.7 10*3/uL — ABNORMAL HIGH (ref 4.0–10.5)
nRBC: 0 % (ref 0.0–0.2)

## 2019-05-27 LAB — GLUCOSE, CAPILLARY: Glucose-Capillary: 117 mg/dL — ABNORMAL HIGH (ref 70–99)

## 2019-05-27 MED ORDER — PANTOPRAZOLE SODIUM 40 MG PO TBEC
40.0000 mg | DELAYED_RELEASE_TABLET | Freq: Every day | ORAL | Status: DC
  Administered 2019-05-27: 22:00:00 40 mg via ORAL
  Filled 2019-05-27: qty 1

## 2019-05-27 MED ORDER — DEXAMETHASONE 4 MG PO TABS
4.0000 mg | ORAL_TABLET | Freq: Four times a day (QID) | ORAL | Status: DC
  Administered 2019-05-28 (×2): 4 mg via ORAL
  Filled 2019-05-27 (×2): qty 1

## 2019-05-27 MED ORDER — CETIRIZINE HCL 10 MG PO TABS
10.0000 mg | ORAL_TABLET | Freq: Every day | ORAL | Status: DC
  Administered 2019-05-27 – 2019-05-28 (×2): 10 mg via ORAL
  Filled 2019-05-27 (×2): qty 1

## 2019-05-27 NOTE — Evaluation (Signed)
Physical Therapy Evaluation Patient Details Name: Mark Harrell MRN: [id number] DOB: November 07, 1960 Today's Date: 05/27/2019   History of Present Illness  Mark Harrell underwent ACDF by Dr. [name] on Monday 12/28. Since then he has been suffering with increasing SOB and dysphagia. PMH: anxiety, BPH, depression, GERD, obesity, HTN     Clinical Impression  Pt admitted with above. Pt functioning at supervision level with exception of stairs at min guard. Pt on 2lO2 via [place] and SpO2 >94% t/o session. Discussed stair negotiation technique and proper sequencing with cane, pt return reported "Up with the good, down with the bad leg and cane." in reference to ascending leading with strong leg (R) and when descending leading with the weaker leg and cane (L). Pt sleeping in recliner at home per Dr. Arvil Persons orders. Acute PT to cont to follow.    Follow Up Recommendations No PT follow up;Supervision for mobility/OOB    Equipment Recommendations  None recommended by PT    Recommendations for Other Services       Precautions / Restrictions Precautions Precautions: Cervical Precaution Booklet Issued: No Precaution Comments: pt with no recall of precautions from earlier this week, when re-educated pt stated "oh those, yeah I remember those" pt able to recall at end of session Required Braces or Orthoses: Cervical Brace Cervical Brace: Hard collar;At all times Restrictions Weight Bearing Restrictions: No Other Position/Activity Restrictions: no reaching above head, cervical precautions      Mobility  Bed Mobility               General bed mobility comments: OOB in recliner upon entry, pt states "Ive been sleeping in the recliner because Dr. [name] said I can't lay flat" but doesn't recall why he said that. Pt sleeps in recliner at home as well  Transfers Overall transfer level: Needs assistance Equipment used: Straight cane Transfers: Sit to/from Stand Sit to Stand: Supervision         General  transfer comment: pt pushed up from cane and arm rest of chair, pt with increased trunk flexion due to low chair, verbal cues to minimize trunk flexion and to sit on  higher level surfaces  Ambulation/Gait Ambulation/Gait assistance: Min guard Gait Distance (Feet): 200 Feet Assistive device: Straight cane Gait Pattern/deviations: Step-through pattern;Decreased stride length;Trunk flexed Gait velocity: decreased Gait velocity interpretation: 1.31 - 2.62 ft/sec, indicative of limited community ambulator General Gait Details: decreased step length, wide base of support, pt with good sequencing of cane, lowered cane height for optimal support  Stairs            Wheelchair Mobility    Modified Rankin (Stroke Patients Only)       Balance Overall balance assessment: Needs assistance Sitting-balance support: No upper extremity supported;Feet supported Sitting balance-Leahy Scale: Good     Standing balance support: Single extremity supported;During functional activity Standing balance-Leahy Scale: Good Standing balance comment: prefers SPC however can statically stand without UE support                             Pertinent Vitals/Pain Pain Assessment: 0-10 Pain Score: 9  Pain Location: neck and shoulders Pain Descriptors / Indicators: Constant;Discomfort Pain Intervention(s): Monitored during session    Home Living Family/patient expects to be discharged to:: Private residence Living Arrangements: Parent(mother and uncle) Available Help at Discharge: Family;Available 24 hours/day Type of Home: House Home Access: Stairs to enter Entrance Stairs-Rails: None Entrance Stairs-Number of Steps: 3 Home Layout: One  level Home Equipment: Cane - single point;Shower seat      Prior Function Level of Independence: Independent with assistive device(s)         Comments: uses cane for mobility, uncle and patient were helping mother however uncle now helping both pt  and mother     Hand Dominance   Dominant Hand: Right    Extremity/Trunk Assessment   Upper Extremity Assessment Upper Extremity Assessment: Generalized weakness(pt reports continued bilat UE numbness and tingling)    Lower Extremity Assessment Lower Extremity Assessment: Generalized weakness(pt reports bilat LE numbness and tingling)    Cervical / Trunk Assessment Cervical / Trunk Assessment: Other exceptions Cervical / Trunk Exceptions: s/p cervical surgery  Communication   Communication: No difficulties  Cognition Arousal/Alertness: Awake/alert Behavior During Therapy: Flat affect Overall Cognitive Status: Impaired/Different from baseline Area of Impairment: Problem solving;Memory                     Memory: Decreased short-term memory;Decreased recall of precautions       Problem Solving: Slow processing(delayed response time) General Comments: pt flat affect with delayed response time reporting "sorry i'm not quite awake yet" pt more clear and with quicker response at end of session      General Comments General comments (skin integrity, edema, etc.): pt with dressing on incision in anterior neck    Exercises     Assessment/Plan    PT Assessment Patient needs continued PT services  PT Problem List Decreased strength;Decreased activity tolerance;Decreased balance;Decreased mobility;Decreased knowledge of use of DME;Decreased safety awareness;Decreased knowledge of precautions;Pain       PT Treatment Interventions DME instruction;Gait training;Stair training;Functional mobility training;Therapeutic activities;Therapeutic exercise;Neuromuscular re-education;Patient/family education    PT Goals (Current goals can be found in the Care Plan section)  Acute Rehab PT Goals Patient Stated Goal: less pain PT Goal Formulation: With patient Time For Goal Achievement: 06/10/19 Potential to Achieve Goals: Good    Frequency Min 5X/week   Barriers to discharge         Co-evaluation               AM-PAC PT "6 Clicks" Mobility  Outcome Measure Help needed turning from your back to your side while in a flat bed without using bedrails?: A Little Help needed moving from lying on your back to sitting on the side of a flat bed without using bedrails?: A Little Help needed moving to and from a bed to a chair (including a wheelchair)?: A Little Help needed standing up from a chair using your arms (e.g., wheelchair or bedside chair)?: A Little Help needed to walk in hospital room?: A Little Help needed climbing 3-5 steps with a railing? : A Little 6 Click Score: 18    End of Session Equipment Utilized During Treatment: Cervical collar Activity Tolerance: Patient limited by pain Patient left: in chair;with call bell/phone within reach Nurse Communication: Mobility status PT Visit Diagnosis: Unsteadiness on feet (R26.81);Pain;Difficulty in walking, not elsewhere classified (R26.2)    Time: [id number] PT Time Calculation (min) (ACUTE ONLY): 20 min   Charges:   PT Evaluation $PT Eval Low Complexity: 1 Low          Kittie Plater, PT, DPT Acute Rehabilitation Services Pager #: [phone] Office #: [phone]   Berline Lopes 05/27/2019, 8:50 AM

## 2019-05-27 NOTE — Progress Notes (Signed)
Patient ID: Mark Harrell, male   DOB: May 06, 1961, 59 y.o.   MRN: [id number] The patient reports feeling better overall.  He is denying any significant breathing issues and seems to be swallowing better.  He has been seen this morning by speech therapy which is appreciated.  He is status post cervical spine surgery and was readmitted yesterday due to swallowing and breathing difficulties.  He is on IV steroids now and that is helped him greatly he states.  He does appear to be swallowing better looking at him right now and he seems comfortable and breathing better and he is sitting up in a chair.  He does express interest in going home later today.  I told him that it is better to stay 1 more day and discharge him likely tomorrow on oral steroids.

## 2019-05-27 NOTE — Progress Notes (Signed)
2130 Pt arrived from ED to room 4N02, pt on oxygen 2L, stated it's his baseline. Pt A&Ox4, stated swelling has decreased and unable to swallow again, pt wearing aspen collar, dressing to ant neck is CDI. Pt asking for liquids, water, chicken broth and popsicle provided. VSS, pt refusing to get has blood sugar checked per orders. Educated patient about importance of blood sugar control, pt stated he is not diabetic. Pt understands without assistance. Pt assessed per flow sheet. Pt sitting in recliner, RN attempted to place chair alarm, but refused. RN educated about risk of falling with IV line, O2 monitor, and oxygen tubing. Pt stated he would call for assistance. Call bell in reach, [place].   2200 RN called ortho on call MD to clarify decadron orders. Per Dr. [name], RN should give patient 8mg  IV. Dr. [name] stated we could have Dr. [name] review in am.   2245 RN to room to medicate pt, pt found in bathroom. RN re-educated patient about using call bell. RN again insisted chair alarm be placed but again pt refused. RN instructed pt to pull bathroom cord when he was done, pt agreed. RN waiting for pt to finish using restroom, pt had BM. RN medicated per MAR. WCTM.

## 2019-05-27 NOTE — Progress Notes (Signed)
Orthopedic Tech Progress Note Patient Details:  Mark Harrell 28-Sep-1960 [id number] [name] (ortho tech) spoke with the nurse and was told that patient had brace already.  Patient ID: Mark Harrell, male   DOB: May 25, 1961, 59 y.o.   MRN: [id number]   Staci Righter 05/27/2019, 12:38 AM

## 2019-05-27 NOTE — Social Work (Signed)
CSW acknowledging consult for SNF placement/PT/OT/DME as needed.Current therapy recommendations state that pt does not require any f/u from therapy and no DME orders.    CSW signing off. Please consult if any additional needs arise.  Mark Harrell, [place] [facility]

## 2019-05-27 NOTE — Evaluation (Signed)
Clinical/Bedside Swallow Evaluation Patient Details  Name: SAVAGE ARTRIP MRN: [id number] Date of Birth: 01-18-1961  Today's Date: 05/27/2019 Time: SLP Start Time (ACUTE ONLY): 0900 SLP Stop Time (ACUTE ONLY): 0920 SLP Time Calculation (min) (ACUTE ONLY): 20 min  Past Medical History:  Past Medical History:  Diagnosis Date  . Anesthesia complication requiring reversal agent administration    ? from central apnea, very difficult to get off vent  . Anxiety   . Arthritis    osteo  . Asthma   . BPH (benign prostatic hyperplasia)   . Complication of anesthesia    difficulty waking , they twlight me because of my respiratory problems "  . Depression   . Dyspnea    on exertion  . Enlarged heart   . Family history of adverse reaction to anesthesia    mother trouble waking up, and heart stopped  . GERD (gastroesophageal reflux disease)   . Headache    botox injections for headaches  . Hyperlipidemia   . Hypertension   . Hypogonadism male   . IBS (irritable bowel syndrome)   . Memory difficulties    short term memories  . Obesity   . On home oxygen therapy    on 2 liter  . OSA (obstructive sleep apnea)    not using cpap  . Pneumonia   . Pre-diabetes   . Prostatitis    Past Surgical History:  Past Surgical History:  Procedure Laterality Date  . ABDOMINAL SURGERY    . ANKLE FRACTURE SURGERY Right   . ANTERIOR CERVICAL DECOMP/DISCECTOMY FUSION N/A 05/23/2019   Procedure: ANTERIOR CERVICAL DISCECTOMY FUSION CERVICAL FIVE THROUGH CERVICAL SIX AND CERVICAL SIX THROUGH CERVICAL SEVEN;  Surgeon: Jessy Oto, MD;  Location: [facility];  Service: Orthopedics;  Laterality: N/A;  . COLONOSCOPY    . CYSTOSCOPY     [place]  . KNEE ARTHROSCOPY WITH MEDIAL MENISECTOMY Left 01/02/2017   Procedure: LEFT KNEE ARTHROSCOPY WITH PARTIAL MEDIAL MENISCECTOMY;  Surgeon: Mcarthur Rossetti, MD;  Location: [facility];  Service: Orthopedics;  Laterality: Left;  . TONSILLECTOMY    . [place] RESECTION   2007  . UVULOPALATOPHARYNGOPLASTY     HPI:  59 year old male underwent anterior cervical spine surgery three days ago.  He has noticed difficulty swallowing and breathing since surgery that has worsened.  He has only been able to swallow small amounts of liquid.  Per ENT, CT demonstrating retropharyngeal edema at the level of the cervical hardware, no fluid collection.  He has more distal tracheomalacia as well. Pt on IV steroids. CT reads Retropharyngeal soft tissue swelling displaces the pharynx anteriorly. This extends from C2 through C4. Recent ACDF C5-6 and C6-7. Pharyngeal airway patent. Epiglottis and larynx normal. Proximal trachea is significantly narrowed. There is anterior bowing of the posterior wall of the trachea by the esophagus. Tracheal luminal narrowing is greater than 50%.   Assessment / Plan / Recommendation Clinical Impression  Pt demonstrates minimal difficulty swallowing; he reports he is much improved since admission. Vocal quality clear, pt has been tolerating clear liquids without difficulty. No signs of aspiration observed. With SLP supervision, offered a variety of textures, advancing from pudding to regular solids without any significant difficulty noted. Provided education to sensation of residue and strategies to managed. Also educated pt to safe independent texture choices (soft, moist solids) to more challenging textures (meat, particulate matter). Pt independently able to demonstrate appropriate food choices from the menu. Will advance diet to regular to allow pt  to make safe food choices. No f/u from SLP needed.  SLP Visit Diagnosis: Dysphagia, oropharyngeal phase (R13.12)    Aspiration Risk  Mild aspiration risk    Diet Recommendation Regular;Thin liquid   Liquid Administration via: Cup;Straw Medication Administration: Whole meds with puree Supervision: Patient able to self feed Compensations: Follow solids with liquid;Clear throat intermittently Postural  Changes: Seated upright at 90 degrees    Other  Recommendations     Follow up Recommendations None      Frequency and Duration            Prognosis        Swallow Study   General HPI: 59 year old male underwent anterior cervical spine surgery three days ago.  He has noticed difficulty swallowing and breathing since surgery that has worsened.  He has only been able to swallow small amounts of liquid.  Per ENT, CT demonstrating retropharyngeal edema at the level of the cervical hardware, no fluid collection.  He has more distal tracheomalacia as well. Pt on IV steroids. CT reads Retropharyngeal soft tissue swelling displaces the pharynx anteriorly. This extends from C2 through C4. Recent ACDF C5-6 and C6-7. Pharyngeal airway patent. Epiglottis and larynx normal. Proximal trachea is significantly narrowed. There is anterior bowing of the posterior wall of the trachea by the esophagus. Tracheal luminal narrowing is greater than 50%. Type of Study: Bedside Swallow Evaluation Previous Swallow Assessment: none Diet Prior to this Study: Thin liquids History of Recent Intubation: No Behavior/Cognition: Alert;Cooperative;Pleasant mood Oral Cavity Assessment: Within Functional Limits Oral Care Completed by SLP: No Oral Cavity - Dentition: Adequate natural dentition Vision: Functional for self-feeding Self-Feeding Abilities: Able to feed self Patient Positioning: Upright in chair Baseline Vocal Quality: Normal Volitional Cough: Strong Volitional Swallow: Able to elicit    Oral/Motor/Sensory Function Overall Oral Motor/Sensory Function: Within functional limits   Ice Chips     Thin Liquid Thin Liquid: Within functional limits Presentation: Straw    Nectar Thick Nectar Thick Liquid: Not tested   Honey Thick Honey Thick Liquid: Not tested   Puree Puree: Within functional limits   Solid     Solid: Within functional limits     Herbie [place], MA [place] Pager [phone] Office [phone]  Lynann Beaver 05/27/2019,9:21 AM

## 2019-05-27 NOTE — Evaluation (Signed)
Occupational Therapy Evaluation Patient Details Name: Mark Harrell MRN: [id number] DOB: 05-Dec-1960 Today's Date: 05/27/2019    History of Present Illness Mark Harrell underwent ACDF by Dr. [name] on Monday 12/28. Since then he has been suffering with increasing SOB and dysphagia. PMH: anxiety, BPH, depression, GERD, obesity, HTN    Clinical Impression   Patient evaluated by Occupational Therapy with no further acute OT needs identified. All education has been completed and the patient has no further questions. See below for any follow-up Occupational Therapy or equipment needs. OT to sign off. Thank you for referral.      Follow Up Recommendations  No OT follow up    Equipment Recommendations  None recommended by OT    Recommendations for Other Services       Precautions / Restrictions Precautions Precautions: Cervical Precaution Booklet Issued: No Required Braces or Orthoses: Cervical Brace Cervical Brace: Hard collar;At all times Restrictions Weight Bearing Restrictions: No Other Position/Activity Restrictions: no reaching above head, cervical precautions      Mobility Bed Mobility               General bed mobility comments: oob in recliner. plans to sleep in recliner at home  Transfers Overall transfer level: Independent               General transfer comment: no DME used     Balance                                           ADL either performed or assessed with clinical judgement   ADL Overall ADL's : Independent                                       General ADL Comments: demonstrates don doff brace. pt unplugging IV and transfering to bathroom mod i on arrival. pt educated on brace for showering and positioning. pt has toilet aide with wipes he has been using for over year. uncle helps at baseline with meals.    Cervical precautions ( handout provided): Educated patient on don doff brace with return demonstration,  educated on oral care using cups, washing face with cloth, never to wash directly on incision site, avoid neck rotation flexion and extension, positioning with pillows in chair for bil UE, sleeping positioning, avoiding pushing / pulling with bil UE   Vision Baseline Vision/History: Wears glasses Wears Glasses: At all times Additional Comments: sitting in the dark and reports its easier for him to read his tablet     Perception     Praxis      Pertinent Vitals/Pain Pain Location: neck and shoulders Pain Descriptors / Indicators: Constant;Discomfort     Hand Dominance Right   Extremity/Trunk Assessment Upper Extremity Assessment Upper Extremity Assessment: Overall WFL for tasks assessed RUE Deficits / Details: reports numbness at baseline  LUE Deficits / Details: reports numbness at baseline   Lower Extremity Assessment Lower Extremity Assessment: Overall WFL for tasks assessed   Cervical / Trunk Assessment Cervical / Trunk Assessment: Other exceptions Cervical / Trunk Exceptions: s/p cervical surgery   Communication Communication Communication: No difficulties   Cognition Arousal/Alertness: Awake/alert Behavior During Therapy: WFL for tasks assessed/performed Overall Cognitive Status: Within Functional Limits for tasks assessed  Problem Solving: (delayed response time)     General Comments  dressing drya nd intact    Exercises Exercises: Other exercises Other Exercises Other Exercises: educated on cervical precautions with adls   Shoulder Instructions      Home Living Family/patient expects to be discharged to:: Private residence Living Arrangements: Parent(mother and uncle) Available Help at Discharge: Family;Available 24 hours/day Type of Home: House Home Access: Stairs to enter [employer] of Steps: 3 Entrance Stairs-Rails: None Home Layout: One level     Bathroom Shower/Tub: Medical illustrator: Handicapped height     Home Equipment: [place] - single point;Shower seat          Prior Functioning/Environment Level of Independence: Independent with assistive device(s)        Comments: uses cane for mobility, uncle and patient were helping mother however uncle now helping both pt and mother        OT Problem List:        OT Treatment/Interventions:      OT Goals(Current goals can be found in the care plan section) Acute Rehab OT Goals Patient Stated Goal: to go home soon  OT Frequency:     Barriers to D/C:            Co-evaluation              AM-PAC OT "6 Clicks" Daily Activity     Outcome Measure Help from another person eating meals?: None Help from another person taking care of personal grooming?: None Help from another person toileting, which includes using toliet, bedpan, or urinal?: A Little Help from another person bathing (including washing, rinsing, drying)?: A Little Help from another person to put on and taking off regular upper body clothing?: A Little Help from another person to put on and taking off regular lower body clothing?: A Lot 6 Click Score: 19   End of Session Equipment Utilized During Treatment: Cervical collar Nurse Communication: Mobility status;Precautions  Activity Tolerance: Patient tolerated treatment well Patient left: in chair;with call bell/phone within reach  OT Visit Diagnosis: Muscle weakness (generalized) (M62.81)                Time: [id number] OT Time Calculation (min): 17 min Charges:  OT General Charges $OT Visit: 1 Visit OT Evaluation $OT Eval Moderate Complexity: 1 Mod   Brynn, OTR/L  Acute Rehabilitation Services Pager: [phone] Office: [phone] .   Jeri Modena 05/27/2019, 1:13 PM

## 2019-05-28 LAB — GLUCOSE, CAPILLARY: Glucose-Capillary: 154 mg/dL — ABNORMAL HIGH (ref 70–99)

## 2019-05-28 MED ORDER — OXYCODONE HCL 5 MG PO TABS: 5.0000 mg | ORAL_TABLET | ORAL | 0 refills | Status: AC | PRN

## 2019-05-28 MED ORDER — METHYLPREDNISOLONE 4 MG PO TABS: ORAL_TABLET | ORAL | 0 refills | Status: DC

## 2019-05-28 NOTE — Progress Notes (Signed)
Patient ID: Mark Harrell, male   DOB: 01/27/1961, 59 y.o.   MRN: [id number] Doing much better overall.  Denies swallowing difficulties or breathing difficulties.  Can be discharged to home today on oral steroids.

## 2019-05-28 NOTE — Care Management (Signed)
Patient confirms he has a cane, walker and 3n1 at home.  He states he does not need DME oxygen tank to get home but has one available if needed.

## 2019-05-30 ENCOUNTER — Inpatient Hospital Stay: Payer: BC Managed Care – PPO | Admitting: Specialist

## 2019-05-30 ENCOUNTER — Other Ambulatory Visit: Payer: Self-pay | Admitting: Allergy & Immunology

## 2019-05-30 ENCOUNTER — Other Ambulatory Visit: Payer: BC Managed Care – PPO

## 2019-05-30 DIAGNOSIS — G4733 Obstructive sleep apnea (adult) (pediatric): Secondary | ICD-10-CM | POA: Diagnosis not present

## 2019-05-30 DIAGNOSIS — M4802 Spinal stenosis, cervical region: Secondary | ICD-10-CM | POA: Diagnosis not present

## 2019-05-30 DIAGNOSIS — N400 Enlarged prostate without lower urinary tract symptoms: Secondary | ICD-10-CM | POA: Diagnosis not present

## 2019-05-30 DIAGNOSIS — M50122 Cervical disc disorder at C5-C6 level with radiculopathy: Secondary | ICD-10-CM | POA: Diagnosis not present

## 2019-05-30 DIAGNOSIS — M50123 Cervical disc disorder at C6-C7 level with radiculopathy: Secondary | ICD-10-CM | POA: Diagnosis not present

## 2019-05-30 DIAGNOSIS — F329 Major depressive disorder, single episode, unspecified: Secondary | ICD-10-CM | POA: Diagnosis not present

## 2019-05-30 DIAGNOSIS — M1991 Primary osteoarthritis, unspecified site: Secondary | ICD-10-CM | POA: Diagnosis not present

## 2019-05-30 DIAGNOSIS — K219 Gastro-esophageal reflux disease without esophagitis: Secondary | ICD-10-CM | POA: Diagnosis not present

## 2019-05-30 DIAGNOSIS — Z981 Arthrodesis status: Secondary | ICD-10-CM | POA: Diagnosis not present

## 2019-05-30 DIAGNOSIS — Z9981 Dependence on supplemental oxygen: Secondary | ICD-10-CM | POA: Diagnosis not present

## 2019-05-30 DIAGNOSIS — J449 Chronic obstructive pulmonary disease, unspecified: Secondary | ICD-10-CM | POA: Diagnosis not present

## 2019-05-30 DIAGNOSIS — K589 Irritable bowel syndrome without diarrhea: Secondary | ICD-10-CM | POA: Diagnosis not present

## 2019-05-30 DIAGNOSIS — F419 Anxiety disorder, unspecified: Secondary | ICD-10-CM | POA: Diagnosis not present

## 2019-05-30 DIAGNOSIS — Z6841 Body Mass Index (BMI) 40.0 and over, adult: Secondary | ICD-10-CM | POA: Diagnosis not present

## 2019-05-30 DIAGNOSIS — E669 Obesity, unspecified: Secondary | ICD-10-CM | POA: Diagnosis not present

## 2019-05-30 DIAGNOSIS — Z4789 Encounter for other orthopedic aftercare: Secondary | ICD-10-CM | POA: Diagnosis not present

## 2019-05-30 NOTE — Telephone Encounter (Signed)
Patient went to hospital, per [facility] meds are not needed since he is back in the hospital

## 2019-05-31 ENCOUNTER — Telehealth: Payer: Self-pay | Admitting: Specialist

## 2019-05-31 NOTE — Telephone Encounter (Signed)
Flordeluna from [facility] called.  She is requesting verbal orders to start PT 2xwk/4wk to strengthen balance and gate training   She also wants to know if there are restrictions on his arm movement   Call back number: [phone]

## 2019-06-01 ENCOUNTER — Ambulatory Visit
Admission: RE | Admit: 2019-06-01 | Discharge: 2019-06-01 | Disposition: A | Discharge status: Home / Self Care | Payer: BC Managed Care – PPO | Source: Ambulatory Visit | Attending: Specialist | Admitting: Specialist

## 2019-06-01 DIAGNOSIS — M25512 Pain in left shoulder: Secondary | ICD-10-CM | POA: Diagnosis not present

## 2019-06-01 DIAGNOSIS — G8929 Other chronic pain: Secondary | ICD-10-CM

## 2019-06-02 DIAGNOSIS — K219 Gastro-esophageal reflux disease without esophagitis: Secondary | ICD-10-CM | POA: Diagnosis not present

## 2019-06-02 DIAGNOSIS — E669 Obesity, unspecified: Secondary | ICD-10-CM | POA: Diagnosis not present

## 2019-06-02 DIAGNOSIS — M50122 Cervical disc disorder at C5-C6 level with radiculopathy: Secondary | ICD-10-CM | POA: Diagnosis not present

## 2019-06-02 DIAGNOSIS — F419 Anxiety disorder, unspecified: Secondary | ICD-10-CM | POA: Diagnosis not present

## 2019-06-02 DIAGNOSIS — N400 Enlarged prostate without lower urinary tract symptoms: Secondary | ICD-10-CM | POA: Diagnosis not present

## 2019-06-02 DIAGNOSIS — G4733 Obstructive sleep apnea (adult) (pediatric): Secondary | ICD-10-CM | POA: Diagnosis not present

## 2019-06-02 DIAGNOSIS — M50123 Cervical disc disorder at C6-C7 level with radiculopathy: Secondary | ICD-10-CM | POA: Diagnosis not present

## 2019-06-02 DIAGNOSIS — M1991 Primary osteoarthritis, unspecified site: Secondary | ICD-10-CM | POA: Diagnosis not present

## 2019-06-02 DIAGNOSIS — F329 Major depressive disorder, single episode, unspecified: Secondary | ICD-10-CM | POA: Diagnosis not present

## 2019-06-02 DIAGNOSIS — Z9981 Dependence on supplemental oxygen: Secondary | ICD-10-CM | POA: Diagnosis not present

## 2019-06-02 DIAGNOSIS — K589 Irritable bowel syndrome without diarrhea: Secondary | ICD-10-CM | POA: Diagnosis not present

## 2019-06-02 DIAGNOSIS — J449 Chronic obstructive pulmonary disease, unspecified: Secondary | ICD-10-CM | POA: Diagnosis not present

## 2019-06-02 DIAGNOSIS — M4802 Spinal stenosis, cervical region: Secondary | ICD-10-CM | POA: Diagnosis not present

## 2019-06-02 DIAGNOSIS — Z981 Arthrodesis status: Secondary | ICD-10-CM | POA: Diagnosis not present

## 2019-06-02 DIAGNOSIS — Z4789 Encounter for other orthopedic aftercare: Secondary | ICD-10-CM | POA: Diagnosis not present

## 2019-06-02 DIAGNOSIS — Z6841 Body Mass Index (BMI) 40.0 and over, adult: Secondary | ICD-10-CM | POA: Diagnosis not present

## 2019-06-02 NOTE — Telephone Encounter (Signed)
I called and gave Mark Harrell Verbal orders for PT

## 2019-06-06 ENCOUNTER — Encounter: Payer: Self-pay | Admitting: Specialist

## 2019-06-06 ENCOUNTER — Other Ambulatory Visit: Payer: Self-pay

## 2019-06-06 ENCOUNTER — Ambulatory Visit (INDEPENDENT_AMBULATORY_CARE_PROVIDER_SITE_OTHER): Payer: BC Managed Care – PPO

## 2019-06-06 ENCOUNTER — Ambulatory Visit (INDEPENDENT_AMBULATORY_CARE_PROVIDER_SITE_OTHER): Payer: BC Managed Care – PPO | Admitting: Specialist

## 2019-06-06 VITALS — BP 108/69 | HR 79 | Ht 72.0 in | Wt 340.0 lb

## 2019-06-06 DIAGNOSIS — Z981 Arthrodesis status: Secondary | ICD-10-CM

## 2019-06-06 MED ORDER — OXYCODONE-ACETAMINOPHEN 7.5-325 MG PO TABS: 1.0000 | ORAL_TABLET | ORAL | 0 refills | Status: DC | PRN

## 2019-06-06 NOTE — Progress Notes (Signed)
Office Visit Note   Patient: Mark Harrell           Date of Birth: Dec 24, 1960           MRN: [id number] Visit Date: 06/06/2019              Requested by: [name], [address] [place],  [place] [address] PCP: [name], MD   Assessment & Plan: 2 weeks post C5-6 and C6-7  Visit Diagnoses:  1. Status post cervical spinal fusion   Awake, alert and oriented x 4.  Incision is healed a single retained suture anterior incision used to sew in drain removed, no knot. Motor is weak in both arms biceps right hand grip.  Walking is normal. Radiographs with mild swelling prevertebral soft tissues.Hardware C5-7 in good position and aligment.   Plan:Avoid overhead lifting and overhead use of the arms. Do not lift greater than 5 lbs. Adjust head rest in vehicle to prevent hyperextension if rear ended. Take extra precautions to avoid falling, including use of a cane if you feel weak. May wear a soft collar at night and hard collar during the day time. May take in soft foods and liquids, avoid thick foods that may be difficulty to swallow still.   Physical therapy for work on ADLs and fine motor skills. Follow-Up Instructions: No follow-ups on file.   Orders:  Orders Placed This Encounter  Procedures  . XR Cervical Spine 2 or 3 views   No orders of the defined types were placed in this encounter.     Procedures: No procedures performed   Clinical Data: No additional findings.   Subjective: Chief Complaint  Patient presents with  . Neck - Routine Post Op    HPI  Review of Systems   Objective: Vital Signs: BP 108/69 (BP Location: Left Arm, Patient Position: Sitting)   Pulse 79   Ht 6' (1.829 m)   Wt (!) 340 lb (154.2 kg)   BMI 46.11 kg/m   Physical Exam  Ortho Exam  Specialty Comments:  No specialty comments available.  Imaging: No results found.   PMFS History: Patient Active Problem List   Diagnosis Date Noted  . HNP  (herniated nucleus pulposus) with myelopathy, cervical 05/23/2019    Priority: High    Class: Chronic  . Spinal stenosis of cervical region 05/23/2019    Priority: High    Class: Chronic  . Tracheomalacia 05/26/2019  . Acquired tracheomalacia 05/26/2019  . Status post cervical spinal fusion 05/23/2019  . Chronic respiratory failure with hypoxia ([place]) 04/12/2018  . Bilateral lower extremity edema 12/29/2017  . Atypical chest pain 12/29/2017  . Hymenoptera allergy 11/10/2017  . Diverticulosis 08/07/2017  . Family history of colonic polyps 08/07/2017  . Flatus 08/07/2017  . Myofascial pain 08/06/2017  . Seasonal and perennial allergic rhinitis 04/23/2017  . 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 Cervicalgia 04/01/2017  . Cervical spondylosis with radiculopathy 02/09/2017  . Memory difficulty 12/05/2016  . Morbid obesity ([place]) 12/05/2016  . Diarrhea in adult patient 12/03/2016  . Internal hemorrhoids 12/03/2016  . Headache 11/13/2016  . Migraine without aura and without status migrainosus, not intractable 10/30/2016  . Irritant contact dermatitis due to frequent handwashing 06/08/2016  . Lumbar radiculopathy 05/15/2016  . Osteoarthritis of spine with radiculopathy, lumbar region 05/15/2016  . Risk for falls 05/15/2016  . Acute medial meniscal tear, left, subsequent encounter 04/23/2016  . Recurrent infections 03/29/2016  . Chronic nonseasonal allergic rhinitis due to pollen  03/29/2016  . Polypharmacy 01/16/2016  . Morbid obesity with BMI of 50.0-59.9, adult ([place]) 03/06/2015  . SDAT 02/05/2015  . Asthma 02/05/2015  . OSA and COPD overlap syndrome ([place]) 02/05/2015  . Medication management 08/02/2014  . GERD (gastroesophageal reflux disease) 05/09/2014  . Vitamin D deficiency 08/01/2013  . Prediabetes 08/01/2013  . 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 Diverticula of colon 05/07/2011  . Hypertension 01/31/2011  . Hyperlipidemia, mixed 01/31/2011  . BPH (benign prostatic hyperplasia)  01/31/2011  . Testosterone Deficiency 01/31/2011  . IBS (irritable bowel syndrome) 01/31/2011  . Partial complex seizure disorder with intractable epilepsy ([place]) 01/31/2011  . Depression, major, recurrent, in partial remission ([place]) 01/31/2011  . Asthma-COPD overlap syndrome ([place]) 01/31/2011   Past Medical History:  Diagnosis Date  . Anesthesia complication requiring reversal agent administration    ? from central apnea, very difficult to get off vent  . Anxiety   . Arthritis    osteo  . Asthma   . BPH (benign prostatic hyperplasia)   . Complication of anesthesia    difficulty waking , they twlight me because of my respiratory problems "  . Depression   . Dyspnea    on exertion  . Enlarged heart   . Family history of adverse reaction to anesthesia    mother trouble waking up, and heart stopped  . GERD (gastroesophageal reflux disease)   . Headache    botox injections for headaches  . Hyperlipidemia   . Hypertension   . Hypogonadism male   . IBS (irritable bowel syndrome)   . Memory difficulties    short term memories  . Obesity   . On home oxygen therapy    on 2 liter  . OSA (obstructive sleep apnea)    not using cpap  . Pneumonia   . Pre-diabetes   . Prostatitis     Family History  Problem Relation Age of Onset  . Diabetes Paternal Uncle   . Cancer Father        lymphoma, colon  . Diabetes Maternal Grandmother   . Heart disease Maternal Grandfather   . Diabetes Maternal Grandfather   . Diabetes Paternal Grandmother   . Diabetes Paternal Grandfather   . Dementia Mother   . Prostate cancer Maternal Uncle   . Lung disease Neg Hx   . Rheumatologic disease Neg Hx     Past Surgical History:  Procedure Laterality Date  . ABDOMINAL SURGERY    . ANKLE FRACTURE SURGERY Right   . ANTERIOR CERVICAL DECOMP/DISCECTOMY FUSION N/A 05/23/2019   Procedure: ANTERIOR CERVICAL DISCECTOMY FUSION CERVICAL FIVE THROUGH CERVICAL SIX AND CERVICAL SIX THROUGH CERVICAL SEVEN;   Surgeon: Jessy Oto, MD;  Location: [facility];  Service: Orthopedics;  Laterality: N/A;  . COLONOSCOPY    . CYSTOSCOPY     [place]  . KNEE ARTHROSCOPY WITH MEDIAL MENISECTOMY Left 01/02/2017   Procedure: LEFT KNEE ARTHROSCOPY WITH PARTIAL MEDIAL MENISCECTOMY;  Surgeon: Mcarthur Rossetti, MD;  Location: [facility];  Service: Orthopedics;  Laterality: Left;  . TONSILLECTOMY    . [place] RESECTION  2007  . UVULOPALATOPHARYNGOPLASTY     Social History   Occupational History  . Occupation: Dance movement psychotherapist  Tobacco Use  . Smoking status: Former Smoker    Packs/day: 0.10    Years: 15.00    Pack years: 1.50  . Smokeless tobacco: Never Used  . Tobacco comment: significant second-hand exposure through mother  Substance and Sexual Activity  . Alcohol use: Yes  Alcohol/week: 1.0 standard drinks    Types: 1 Glasses of wine per week    Comment: 2 x a year  . Drug use: No  . Sexual activity: Never

## 2019-06-06 NOTE — Discharge Summary (Signed)
Patient ID: CHRISTIAAN STREBECK MRN: [id number] DOB/AGE: [birth year] 59 y.o.  Admit date: 05/23/2019 Discharge date: 06/06/2019  Admission Diagnoses:  Principal Problem:   HNP (herniated nucleus pulposus) with myelopathy, cervical Active Problems:   Spinal stenosis of cervical region   Status post cervical spinal fusion   Discharge Diagnoses:  Principal Problem:   HNP (herniated nucleus pulposus) with myelopathy, cervical Active Problems:   Spinal stenosis of cervical region   Status post cervical spinal fusion  status post Procedure(s): ANTERIOR CERVICAL DISCECTOMY FUSION CERVICAL FIVE THROUGH CERVICAL SIX AND CERVICAL SIX THROUGH CERVICAL SEVEN  Past Medical History:  Diagnosis Date  . Anesthesia complication requiring reversal agent administration    ? from central apnea, very difficult to get off vent  . Anxiety   . Arthritis    osteo  . Asthma   . BPH (benign prostatic hyperplasia)   . Complication of anesthesia    difficulty waking , they twlight me because of my respiratory problems "  . Depression   . Dyspnea    on exertion  . Enlarged heart   . Family history of adverse reaction to anesthesia    mother trouble waking up, and heart stopped  . GERD (gastroesophageal reflux disease)   . Headache    botox injections for headaches  . Hyperlipidemia   . Hypertension   . Hypogonadism male   . IBS (irritable bowel syndrome)   . Memory difficulties    short term memories  . Obesity   . On home oxygen therapy    on 2 liter  . OSA (obstructive sleep apnea)    not using cpap  . Pneumonia   . Pre-diabetes   . Prostatitis     Surgeries: Procedure(s): ANTERIOR CERVICAL DISCECTOMY FUSION CERVICAL FIVE THROUGH CERVICAL SIX AND CERVICAL SIX THROUGH CERVICAL SEVEN on 05/23/2019   Consultants:   Discharged Condition: Improved  Hospital Course: SHEY BARTMESS is an 59 y.o. male who was admitted 05/23/2019 for operative treatment of HNP (herniated nucleus pulposus) with  myelopathy, cervical. Patient failed conservative treatments (please see the history and physical for the specifics) and had severe unremitting pain that affects sleep, daily activities and work/hobbies. After pre-op clearance, the patient was taken to the operating room on 05/23/2019 and underwent  Procedure(s): ANTERIOR CERVICAL DISCECTOMY [place].    Patient was given perioperative antibiotics:  Anti-infectives (From admission, onward)   Start     Dose/Rate Route Frequency Ordered Stop   05/23/19 2200  doxycycline (VIBRA-TABS) tablet 100 mg  Status:  Discontinued     100 mg Oral 2 times daily 05/23/19 1440 05/24/19 2300   05/23/19 1445  ceFAZolin (ANCEF) IVPB 2g/100 mL premix     2 g 200 mL/hr over 30 Minutes Intravenous Every 8 hours 05/23/19 1430 05/23/19 2315   05/23/19 0630  ceFAZolin (ANCEF) 3 g in dextrose 5 % 50 mL IVPB     3 g 100 mL/hr over 30 Minutes Intravenous To [facility] 05/19/19 0855 05/23/19 1618       Patient was given sequential compression devices and early ambulation to prevent DVT.   Patient benefited maximally from hospital stay and there were no complications. At the time of discharge, the patient was urinating/moving their bowels without difficulty, tolerating a regular diet, pain is controlled with oral pain medications and they have been cleared by PT/OT.   Recent vital signs: No data found.   Recent laboratory  studies: No results for input(s): WBC, HGB, HCT, PLT, NA, K, CL, CO2, BUN, CREATININE, GLUCOSE, INR, CALCIUM in the last 72 hours.  Invalid input(s): PT, 2   Discharge Medications:      Reactions   Bee Venom Swelling   Duloxetine Shortness Of Breath   Brought on asthma   Ppd [tuberculin Purified Protein Derivative] Other (See Comments)   +ppd NEG Quantferron Gold 3/13 (shows false positive)    Fenofibrate Other (See Comments)   Back pain   Verapamil Other (See  Comments)   Back pain   Claritin [loratadine] Other (See Comments)   UNKNOWN    Levofloxacin Diarrhea   Other Diarrhea   "Some antibiotics" cause diarrhea      Medication List    STOP taking these medications   HYDROcodone-acetaminophen 7.5-325 MG tablet Commonly known as: NORCO     TAKE these medications   acetaminophen 500 MG tablet Commonly known as: TYLENOL Take 1,000 mg by mouth every 6 (six) hours as needed for moderate pain.   albuterol 108 (90 Base) MCG/ACT inhaler Commonly known as: VENTOLIN HFA 1 to 2 inhalations 10 to 15 minutes apart every 4 hrs to rescue Asthma What changed:   how much to take  how to take this  when to take this  reasons to take this  additional instructions   alfuzosin 10 MG 24 hr tablet Commonly known as: UROXATRAL Take 10 mg by mouth at bedtime.   anastrozole 1 MG tablet Commonly known as: ARIMIDEX Take 1 mg by mouth daily.   ascorbic acid 500 MG tablet Commonly known as: VITAMIN C Take 500 mg by mouth daily as needed (immune support).   azelastine 0.1 % nasal spray Commonly known as: ASTELIN Use 1 to 2 sprays  Each Nostril 2 x /day What changed:   how much to take  how to take this  when to take this  additional instructions   Azelastine-Fluticasone 137-50 MCG/ACT Susp 2 spray each nostril 1-2 times daily   baclofen 10 MG tablet Commonly known as: LIORESAL Take 10 mg by mouth 3 (three) times daily as needed for muscle spasms.   bisoprolol-hydrochlorothiazide 5-6.25 MG tablet Commonly known as: ZIAC Take 1 tablet Daily for BP What changed:   how much to take  how to take this  when to take this  additional instructions   Botulinum Toxin Type A 200 units Solr Inject 1 each into the skin every 3 (three) months.   cetaphil lotion Apply topically 2 (two) times daily. What changed: how much to take   cetirizine 10 MG tablet Commonly known as: ZYRTEC Take 10 mg by mouth daily.   CINNAMON PO Take  1,000 mg by mouth 3 (three) times daily.   Citracal Maximum Plus Tabs Take 1 tablet by mouth 3 (three) times daily.   clotrimazole-betamethasone cream Commonly known as: LOTRISONE APPLY TO AFFECTED AREA TWICE A DAY What changed: See the new instructions.   CoQ-10 400 MG Caps Take 400 mg by mouth daily.   cyclobenzaprine 10 MG tablet Commonly known as: FLEXERIL Take 10 mg by mouth 3 (three) times daily.   donepezil 23 MG Tabs tablet Commonly known as: ARICEPT Take 23 mg by mouth at bedtime.   doxycycline 100 MG capsule Commonly known as: VIBRAMYCIN TAKE 1 CAPSULE BY MOUTH TWICE A DAY   Dupixent 300 MG/2ML prefilled syringe Generic drug: dupilumab RX1: INJECT 2 SYRINGES UNDER THE SKIN (SUBCUTANEOUS INJECTION) ON DAY 1 , THEN 1 SYRINGE ON DAY  15 AND EVERY OTHER WEEK THEREAFTER What changed: See the new instructions.   EPINEPHrine 0.3 mg/0.3 mL Soaj injection Commonly known as: EPI-PEN Inject 0.3 mLs (0.3 mg total) into the muscle as needed. What changed: reasons to take this   fluticasone 50 MCG/ACT nasal spray Commonly known as: Flonase Use 1 to 2 sprays each Nostril 2 x /day What changed:   how much to take  how to take this  when to take this  additional instructions   furosemide 80 MG tablet Commonly known as: LASIX TAKE 1 TO 1 & 1/2 TABLETS 2 X /DAY FOR FLUID RETENTION & SWELLING OF LEGS What changed: See the new instructions.   Glucos-Chondroit-MSM Complex Tabs Take 1 tablet by mouth 3 (three) times daily.   ipratropium-albuterol 0.5-2.5 (3) MG/3ML Soln Commonly known as: DUONEB Take 3 mLs by nebulization every 4 (four) hours as needed. What changed: reasons to take this   KRILL OIL PO Take 1,000 mg by mouth 3 (three) times daily.   l-methylfolate-B6-B12 3-35-2 MG Tabs tablet Commonly known as: METANX Take 1 tablet by mouth daily.   LUTEIN PO Take 20 mg by mouth daily.   Lyrica 150 MG capsule Generic drug: pregabalin Take 2 capsules (300 mg  total) by mouth 2 (two) times daily. What changed:   how much to take  when to take this   Magnesium 500 MG Caps Take 500 mg by mouth daily with supper.   metFORMIN 500 MG 24 hr tablet Commonly known as: GLUCOPHAGE-XR Take 2 tablets 2 x /day with Breakfast & Supper  for Diabetes What changed:   how much to take  how to take this  when to take this  additional instructions   metolazone 5 MG tablet Commonly known as: ZAROXOLYN Take 1 tablet daily for Fluid Retention What changed:   how much to take  how to take this  when to take this  reasons to take this  additional instructions   montelukast 10 MG tablet Commonly known as: SINGULAIR Take 1 tablet Daily for Allergies What changed:   how much to take  how to take this  when to take this  additional instructions   Mucinex Maximum Strength 1200 MG Tb12 Generic drug: Guaifenesin Take 1,200 mg by mouth 2 (two) times daily.   PreserVision AREDS 2 Caps Take 1 capsule by mouth 2 (two) times daily.   multivitamin with minerals tablet Take 1 tablet by mouth daily.   Namenda 10 MG tablet Generic drug: memantine Take 20 mg by mouth at bedtime.   Narcan 4 MG/0.1ML Liqd nasal spray kit Generic drug: naloxone Place 1 spray into the nose as needed (opioid overdose).   ondansetron 4 MG disintegrating tablet Commonly known as: ZOFRAN-ODT TAKE 1 TABLET (4 MG TOTAL) BY MOUTH EVERY 6 (SIX) HOURS AS NEEDED FOR NAUSEA OR VOMITING.   oxymetazoline 0.05 % nasal spray Commonly known as: AFRIN Place 1 spray into both nostrils See admin instructions. 1 SPRAY PER SIDE EACH NIGHT BEFORE DYMISTA   phenol 1.4 % Liqd Commonly known as: CHLORASEPTIC Use as directed 1 spray in the mouth or throat as needed for throat irritation / pain.   potassium chloride SA 20 MEQ tablet Commonly known as: Klor-Con M20 Take 1 tablet 2 x  /day for Potassium What changed:   how much to take  how to take this  when to take  this  additional instructions   pravastatin 40 MG tablet Commonly known as: PRAVACHOL Take 1 tablet  Daily at Bedtime for Cholesterol What changed:   how much to take  how to take this  when to take this  additional instructions   PROBIOTIC PRODUCT PO Take 1 capsule by mouth 3 (three) times daily.   promethazine-dextromethorphan 6.25-15 MG/5ML syrup Commonly known as: PROMETHAZINE-DM Take 5 mLs by mouth 4 (four) times daily as needed for cough.   sertraline 50 MG tablet Commonly known as: ZOLOFT Take 50 mg by mouth 2 (two) times daily.   sodium chloride 0.65 % Soln nasal spray Commonly known as: OCEAN Place 1 spray into both nostrils 4 (four) times daily as needed for congestion. Uses each time before other nasal sprays   tadalafil 5 MG tablet Commonly known as: CIALIS Take 5 mg by mouth daily.   Trelegy Ellipta 100-62.5-25 MCG/INH Aepb Generic drug: Fluticasone-Umeclidin-Vilant Inhale 1 puff into the lungs daily.   trospium 20 MG tablet Commonly known as: SANCTURA Take 20 mg by mouth 2 (two) times daily.   TURMERIC PO Take 1 capsule by mouth 3 (three) times daily.   Viberzi 100 MG Tabs Generic drug: Eluxadoline Take 100 mg by mouth 2 (two) times daily.   Vitamin D3 250 MCG (10000 UT) capsule Take 10,000 Units by mouth 3 (three) times daily.   zinc gluconate 50 MG tablet Take 50 mg by mouth daily.       Diagnostic Studies: DG Chest 2 View  Result Date: 05/18/2019 CLINICAL DATA:  Preop ACDF EXAM: CHEST - 2 VIEW COMPARISON:  01/16/2018 FINDINGS: Cardiomegaly. Lungs clear. No effusions. No acute bony abnormality. IMPRESSION: Cardiomegaly.  No active disease. Electronically Signed   By: Rolm Baptise M.D.   On: 05/18/2019 15:56   DG Cervical Spine 2 or 3 views  Result Date: 05/24/2019 CLINICAL DATA:  Postop fusion EXAM: CERVICAL SPINE - 2-3 VIEW COMPARISON:  None. FINDINGS: Changes of anterior fusion from C5-C7. Normal alignment. No hardware complicating  feature. Degenerative disc disease at C4-5. Mild prevertebral soft tissue swelling at the surgical level compatible with postoperative changes. IMPRESSION: Postoperative changes from C5-C7 ACDF. Mild prevertebral soft tissue swelling. No hardware complicating feature. Electronically Signed   By: Rolm Baptise M.D.   On: 05/24/2019 08:52   DG Cervical Spine 2-3 Views  Result Date: 05/23/2019 CLINICAL DATA:  ACDF C5-C7. EXAM: CERVICAL SPINE - 2-3 VIEW; DG C-ARM 1-60 MIN COMPARISON:  03/06/2019 MRI cervical spine and 12/23/2018 cervical spine radiographs FLUOROSCOPY TIME:  Fluoroscopy Time:  0 minutes 18 seconds Number of Acquired Spot Images: 3 FINDINGS: Nondiagnostic spot fluoroscopic intraoperative radiographs demonstrate postsurgical changes from ACDF C5-C7. IMPRESSION: Intraoperative fluoroscopic guidance for ACDF C5-C7. Electronically Signed   By: Ilona Sorrel M.D.   On: 05/23/2019 12:14   CT Soft Tissue Neck W Contrast  Result Date: 05/26/2019 CLINICAL DATA:  Short of breath and trouble swallowing. ACDF 05/23/2019 EXAM: CT NECK WITH CONTRAST TECHNIQUE: Multidetector CT imaging of the neck was performed using the standard protocol following the bolus administration of intravenous contrast. CONTRAST:  167m OMNIPAQUE IOHEXOL 350 MG/ML SOLN COMPARISON:  MRI cervical spine 04/17/2018.  CT chest 01/04/2017 FINDINGS: Pharynx and larynx: Retropharyngeal soft tissue swelling displaces the pharynx anteriorly. This extends from C2 through C4. Recent ACDF C5-6 and C6-7. Pharyngeal airway patent. Epiglottis and larynx normal. Proximal trachea is significantly narrowed. There is anterior bowing of the posterior wall of the trachea by the esophagus. Tracheal luminal narrowing is greater than 50%. Salivary glands: No inflammation, mass, or stone. Thyroid: Normal thyroid. Soft tissue swelling around the  left lobe of the thyroid from recent ACDF surgery. Lymph nodes: No enlarged lymph nodes in the neck. Vascular: Normal  vascular enhancement. Limited intracranial: Negative Visualized orbits: Not imaged Mastoids and visualized paranasal sinuses: Negative Skeleton: ACDF C5-6 and C6-7. Hardware interbody bone graft in good position. No acute skeletal abnormality. Normal alignment. Upper chest: Lung apices clear bilaterally. Other: None IMPRESSION: Moderate prevertebral soft tissue swelling C2 through C4. This presumably related to recent ACDF C5-6 and C6-7. The pharynx is displaced anteriorly without significant compromise of the pharyngeal airway. ACDF C5-6 and C6-7. Hardware in good position. Mild soft tissue swelling in the left neck from recent surgery. Severe tracheomalacia with flattening of the posterior wall of the trachea. Electronically Signed   By: Franchot Gallo M.D.   On: 05/26/2019 08:07   CT Angio Chest PE W and/or Wo Contrast  Result Date: 05/26/2019 CLINICAL DATA:  Shortness of breath.  Recent cervical spine surgery EXAM: CT ANGIOGRAPHY CHEST WITH CONTRAST TECHNIQUE: Multidetector CT imaging of the chest was performed using the standard protocol during bolus administration of intravenous contrast. Multiplanar CT image reconstructions and MIPs were obtained to evaluate the vascular anatomy. CONTRAST:  137m OMNIPAQUE IOHEXOL 350 MG/ML SOLN COMPARISON:  Chest radiograph May 26, 2019; CT angiogram chest January 04, 2017 FINDINGS: Cardiovascular: There is no demonstrable pulmonary embolus. There is no appreciable thoracic aortic aneurysm or dissection. The visualized great vessels appear unremarkable. There is no pericardial effusion or pericardial thickening. There are occasional foci of coronary artery calcification. The main pulmonary outflow tract is enlarged, measuring 4.2 cm in diameter. Mediastinum/Nodes: Foci of air in the left neck region may well be of postoperative etiology. Visualized thyroid appears unremarkable. There is moderate anterior mediastinal fat. There are subcentimeter mediastinal lymph  nodes but no frank adenopathy by size criteria. There are areas of esophageal thickening, most notably involving the upper thoracic region of the esophagus. Esophageal thickening impresses upon the posterior aspect of the trachea. Lungs/Pleura: There are scattered areas of atelectatic change. There is no edema or consolidation. No pleural effusions are evident. There is a 4 mm nodular opacity in the anterior segment right upper lobe seen on axial slice 55 series 7. Upper Abdomen: Visualized upper abdominal structures appear unremarkable. Musculoskeletal: There is degenerative change in the thoracic spine with diffuse idiopathic skeletal hyperostosis. There is incomplete visualization of postoperative change in the lower cervical region. No blastic or lytic bone lesions are evident. No chest wall lesions are identified. Review of the MIP images confirms the above findings. IMPRESSION: 1. No demonstrable pulmonary embolus. No thoracic aortic aneurysm or dissection. There are occasional foci of coronary artery calcification. 2. There is enlargement of the main pulmonary outflow tract, a finding indicative of pulmonary arterial hypertension. 3. Areas of mild atelectasis. No lung edema or consolidation. 4 mm nodular opacity noted in the anterior segment right upper lobe. No follow-up needed if patient is low-risk. Non-contrast chest CT can be considered in 12 months if patient is high-risk. This recommendation follows the consensus statement: Guidelines for Management of Incidental Pulmonary Nodules Detected on CT Images: From the Fleischner Society 2017; Radiology 2017; 284:228-243. 4. Esophageal wall thickening at several sites, most notably in the upper thoracic region. This is a finding that may warrant direct visualization of the esophagus to further assess. 5. Mild soft tissue air noted in the left lower neck region, likely of postoperative etiology given recent cervical spine surgery. 6.  Diffuse idiopathic  skeletal hyperostosis in the thoracic spine. Electronically Signed  By: Lowella Grip III M.D.   On: 05/26/2019 08:05   MR SHOULDER LEFT WO CONTRAST  Result Date: 06/02/2019 CLINICAL DATA:  Left shoulder pain painful range of motion. The patient fell in October 2019. Diffuse upper extremity weakness. EXAM: MRI OF THE LEFT SHOULDER WITHOUT CONTRAST TECHNIQUE: Multiplanar, multisequence MR imaging of the shoulder was performed. No intravenous contrast was administered. COMPARISON:  None. FINDINGS: Rotator cuff: Intact supraspinatus, infraspinatus, teres minor and subscapularis tendons. Muscles: No atrophy or abnormal signal of the muscles of the rotator cuff. Biceps long head:  Properly located and intact. Acromioclavicular Joint: Minimal AC joint arthropathy. Type 2 acromion. No bursitis. Glenohumeral Joint: No joint effusion. No chondral defect. Labrum:  Intact. Bones:  No marrow abnormality, fracture or dislocation. Other: None IMPRESSION: Slight AC joint arthropathy. Otherwise, normal MRI of the left shoulder. Electronically Signed   By: Lorriane Shire M.D.   On: 06/02/2019 08:35   DG Chest Port 1 View  Result Date: 05/26/2019 CLINICAL DATA:  Shortness of breath EXAM: PORTABLE CHEST 1 VIEW COMPARISON:  05/18/2019 FINDINGS: Cardiopericardial enlargement at least partially related to mediastinal fat. There is no edema, consolidation, effusion, or pneumothorax. Degenerative endplate spurring and ACDF hardware. IMPRESSION: No active disease. Electronically Signed   By: Monte Fantasia M.D.   On: 05/26/2019 06:29   DG C-Arm 1-60 Min  Result Date: 05/23/2019 CLINICAL DATA:  ACDF C5-C7. EXAM: CERVICAL SPINE - 2-3 VIEW; DG C-ARM 1-60 MIN COMPARISON:  03/06/2019 MRI cervical spine and 12/23/2018 cervical spine radiographs FLUOROSCOPY TIME:  Fluoroscopy Time:  0 minutes 18 seconds Number of Acquired Spot Images: 3 FINDINGS: Nondiagnostic spot fluoroscopic intraoperative radiographs demonstrate postsurgical  changes from ACDF C5-C7. IMPRESSION: Intraoperative fluoroscopic guidance for ACDF C5-C7. Electronically Signed   By: Ilona Sorrel M.D.   On: 05/23/2019 12:14   XR Cervical Spine 2 or 3 views  Result Date: 06/06/2019 AP and lateral radiographs with hardware and bone graft in good position and alignment. Soft tissue swelling that is less than that seen I the hospital, by almost 50%   Discharge Instructions    Call MD / Call 911   Complete by: As directed    If you experience chest pain or shortness of breath, CALL 911 and be transported to the hospital emergency room.  If you develope a fever above 101 F, pus (white drainage) or increased drainage or redness at the wound, or calf pain, call your surgeon's office.   Constipation Prevention   Complete by: As directed    Drink plenty of fluids.  Prune juice may be helpful.  You may use a stool softener, such as Colace (over the counter) 100 mg twice a day.  Use MiraLax (over the counter) for constipation as needed.   Diet - low sodium heart healthy   Complete by: As directed    Discharge instructions   Complete by: As directed    No lifting greater than 10 lbs. No overhead use of arms. Avoid bending,and twisting neck. Walk in house for first week them may start to get out slowly increasing distance up to one mile by 3 weeks post op. Keep incision dry for 3 days, may then bathe and wet incision using a [place] collar when showering. Call if any fevers >101, chills, or increasing numbness or weakness or increased swelling or drainage.   Driving restrictions   Complete by: As directed    No driving for 3 weeks   Increase activity slowly as tolerated   Complete  by: As directed    Lifting restrictions   Complete by: As directed    No lifting for 8 weeks      Follow-up Information    Jessy Oto, MD In 2 weeks.   Specialty: Orthopedic Surgery Why: For wound re-check Contact information: [place]   [id number] [phone]           Discharge Plan:  discharge to hpme  Disposition:     Signed: Benjiman Core  06/06/2019, 3:17 PM

## 2019-06-06 NOTE — Patient Instructions (Addendum)
Plan:Avoid overhead lifting and overhead use of the arms. Do not lift greater than 5 lbs. Adjust head rest in vehicle to prevent hyperextension if rear ended. Take extra precautions to avoid falling, including use of a cane if you feel weak. May wear a soft collar at night and hard collar during the day time. May take in soft foods and liquids, avoid thick foods that may be difficulty to swallow still.   Physical therapy for work on ADLs and fine motor skills. Follow-Up Instructions: No follow-ups on file.

## 2019-06-07 ENCOUNTER — Telehealth: Payer: Self-pay | Admitting: *Deleted

## 2019-06-07 NOTE — Progress Notes (Signed)
Subjective:    Patient ID: Mark Harrell, male    DOB: 1960/10/06, 59 y.o.   MRN: [id number]  HPI   Patient is a very nice 59 yo single WM recently hospitalized 05/23/2019 - 05/24/2019 with Cx HNP / Cx stenosis undergoing Ant Cx Discectomy/ Cx Fusion of C5/6, C6/7 by Dr [name].     He presented back to the ER on 12/31 with worsening dyspnea & dysphagia and neck CT scan found moderate prevertebral swelling compromising severe tracheomalacia & he was seen & admitted for IV steroids. With clinical improvement he was discharged home on 05/28/2019 on oral steroids.      Patient has been followed for HTN, T2_DM, HLD, Low T, Allergic Asthma / Restrictive Lung Disease,TBI s/p head injury with cognitive dysfunctionand Vitamin D Deficiency.  He has Morbid Obesity (BMI 50+).  A major concerning issue has been his polypharmacy in the past taking up to 50+ different medications & supplements. (Currently has #54 meds ! ).                     Patient presents now with concerns re: his BP  Occasionally has had a slight postural drop with standing. Has not fainted.  Medication Sig  . acetaminophen (TYLENOL) 500 MG tablet Take 1,000 mg by mouth every 6 (six) hours as needed for moderate pain.  [name] albuterol (PROVENTIL HFA;VENTOLIN HFA) 108 (90 Base) MCG/ACT inhaler 1 to 2 inhalations 10 to 15 minutes apart every 4 hrs to rescue Asthma (Patient taking differently: Inhale 1-2 puffs into the lungs every 4 (four) hours as needed for wheezing or shortness of breath. )  . alfuzosin (UROXATRAL) 10 MG 24 hr tablet Take 10 mg by mouth at bedtime.   [name] ascorbic acid (VITAMIN C) 500 MG tablet Take 500 mg by mouth daily as needed (immune support).   [name] azelastine (ASTELIN) 0.1 % nasal spray Use 1 to 2 sprays  Each Nostril 2 x /day (Patient taking differently: Place 2 sprays into both nostrils 2 (two) times daily. )  . bisoprolol-hydrochlorothiazide (ZIAC) 5-6.25 MG tablet Take 1 tablet Daily for BP (Patient taking differently: Take  1 tablet by mouth daily. )  . Botulinum Toxin Type A 200 units SOLR Inject 1 each into the skin every 3 (three) months.   . cetaphil (CETAPHIL) lotion Apply topically 2 (two) times daily. (Patient taking differently: Apply 1 application topically 2 (two) times daily. )  . cetirizine (ZYRTEC) 10 MG tablet Take 10 mg by mouth daily.  . Cholecalciferol (VITAMIN D3) 250 MCG (10000 UT) capsule Take 10,000 Units by mouth 3 (three) times daily.  [name] CINNAMON PO Take 1,000 mg by mouth 3 (three) times daily.  . clotrimazole-betamethasone (LOTRISONE) cream APPLY TO AFFECTED AREA TWICE A DAY (Patient taking differently: Apply 1 application topically 2 (two) times daily as needed (rash). )  . cyclobenzaprine (FLEXERIL) 10 MG tablet Take 10 mg by mouth 3 (three) times daily.   [name] donepezil (ARICEPT) 23 MG TABS tablet Take 23 mg by mouth at bedtime.  [name] doxycycline (VIBRAMYCIN) 100 MG capsule TAKE 1 CAPSULE BY MOUTH TWICE A DAY (Patient taking differently: Take 100 mg by mouth 2 (two) times daily. )  . DUPIXENT 300 MG/2ML SOSY RX1: INJECT 2 SYRINGES UNDER THE SKIN (SUBCUTANEOUS INJECTION) ON DAY 1 , THEN 1 SYRINGE ON DAY 15 AND EVERY OTHER WEEK THEREAFTER (Patient taking differently: Inject 300 mg into the skin every 14 (fourteen) days. )  . Eluxadoline (VIBERZI) 100  MG TABS Take 100 mg by mouth 2 (two) times daily.   [name] EPINEPHrine 0.3 mg/0.3 mL IJ SOAJ injection Inject 0.3 mLs (0.3 mg total) into the muscle as needed. (Patient taking differently: Inject 0.3 mg into the muscle as needed for anaphylaxis. )  . fluticasone (FLONASE) 50 MCG/ACT nasal spray Use 1 to 2 sprays each Nostril 2 x /day (Patient taking differently: Place 2 sprays into both nostrils 2 (two) times daily. )  . Fluticasone-Umeclidin-Vilant (TRELEGY ELLIPTA) 100-62.5-25 MCG/INH AEPB Inhale 1 puff into the lungs daily.  . furosemide (LASIX) 80 MG tablet TAKE 1 TO 1 & 1/2 TABLETS 2 X /DAY FOR FLUID RETENTION & SWELLING OF LEGS (Patient taking  differently: Take 80-120 mg by mouth 2 (two) times daily as needed for edema. )  . Guaifenesin (MUCINEX MAXIMUM STRENGTH) 1200 MG TB12 Take 1,200 mg by mouth 2 (two) times daily.  [name] ipratropium-albuterol (DUONEB) 0.5-2.5 (3) MG/3ML SOLN Take 3 mLs by nebulization every 4 (four) hours as needed. (Patient taking differently: Take 3 mLs by nebulization every 4 (four) hours as needed (shortness of breath). )  . KRILL OIL PO Take 1,000 mg by mouth 3 (three) times daily.   . LUTEIN PO Take 20 mg by mouth daily.   . Magnesium 500 MG CAPS Take 500 mg by mouth daily with supper.  . memantine (NAMENDA) 10 MG tablet Take 20 mg by mouth at bedtime.   . metFORMIN (GLUCOPHAGE-XR) 500 MG 24 hr tablet Take 2 tablets 2 x /day with Breakfast & Supper  for Diabetes (Patient taking differently: Take 1,000 mg by mouth 2 (two) times daily with a meal. Breakfast and dinner)  . Misc Natural Products (GLUCOS-CHONDROIT-MSM COMPLEX) TABS Take 1 tablet by mouth 3 (three) times daily.  . montelukast (SINGULAIR) 10 MG tablet Take 1 tablet Daily for Allergies (Patient taking differently: Take 10 mg by mouth at bedtime. )  . Multiple Vitamins-Minerals (MULTIVITAMIN WITH MINERALS) tablet Take 1 tablet by mouth daily.  . naloxone (NARCAN) nasal spray 4 mg/0.1 mL Place 1 spray into the nose as needed (opioid overdose).   . ondansetron (ZOFRAN-ODT) 4 MG disintegrating tablet TAKE 1 TABLET (4 MG TOTAL) BY MOUTH EVERY 6 (SIX) HOURS AS NEEDED FOR NAUSEA OR VOMITING.  [name] oxyCODONE-acetaminophen (PERCOCET) 7.5-325 MG tablet Take 1 tablet by mouth every 4 (four) hours as needed for severe pain.  [name] oxymetazoline (AFRIN) 0.05 % nasal spray Place 1 spray into both nostrils See admin instructions. 1 SPRAY PER SIDE EACH NIGHT BEFORE DYMISTA  . potassium chloride SA (KLOR-CON M20) 20 MEQ tablet Take 1 tablet 2 x  /day for Potassium (Patient taking differently: Take 20 mEq by mouth 2 (two) times daily. )  . pravastatin (PRAVACHOL) 40 MG tablet Take  1 tablet Daily at Bedtime for Cholesterol (Patient taking differently: Take 20 mg by mouth at bedtime. )  . PROBIOTIC PRODUCT PO Take 1 capsule by mouth 3 (three) times daily.   . promethazine-dextromethorphan (PROMETHAZINE-DM) 6.25-15 MG/5ML syrup Take 5 mLs by mouth 4 (four) times daily as needed for cough.  . sertraline (ZOLOFT) 50 MG tablet Take 50 mg by mouth 2 (two) times daily.   . sodium chloride (OCEAN) 0.65 % SOLN nasal spray Place 1 spray into both nostrils 4 (four) times daily as needed for congestion. Uses each time before other nasal sprays  . tadalafil (CIALIS) 5 MG tablet Take 5 mg by mouth daily.   . TURMERIC PO Take 1 capsule by mouth 3 (three) times  daily.   . zinc gluconate 50 MG tablet Take 50 mg by mouth daily.  [name] LYRICA 150 MG capsule Take 2 capsules (300 mg total) by mouth 2 (two) times daily. (Patient taking differently: Take 150 mg by mouth 3 (three) times daily. )  . phenol (CHLORASEPTIC) 1.4 % LIQD Use as directed 1 spray in the mouth or throat as needed for throat irritation / pain. (Patient taking differently: Use as directed 1 spray in the mouth or throat daily as needed for throat irritation / pain. )    Allergies  Allergen Reactions  . Bee Venom Swelling  . Duloxetine Shortness Of Breath    Brought on asthma  . Ppd [Tuberculin Purified Protein Derivative] Other (See Comments)    +ppd NEG Quantferron Gold 3/13 (shows false positive)   . Fenofibrate Other (See Comments)    Back pain  . Verapamil Other (See Comments)    Back pain  . Claritin [Loratadine] Other (See Comments)    UNKNOWN   . Levofloxacin Diarrhea  . Other Diarrhea    "Some antibiotics" cause diarrhea   Past Medical History:  Diagnosis Date  . Anesthesia complication requiring reversal agent administration    ? from central apnea, very difficult to get off vent  . Anxiety   . Arthritis    osteo  . Asthma   . BPH (benign prostatic hyperplasia)   . Complication of anesthesia     difficulty waking , they twlight me because of my respiratory problems "  . Depression   . Dyspnea    on exertion  . Enlarged heart   . Family history of adverse reaction to anesthesia    mother trouble waking up, and heart stopped  . GERD (gastroesophageal reflux disease)   . Headache    botox injections for headaches  . Hyperlipidemia   . Hypertension   . Hypogonadism male   . IBS (irritable bowel syndrome)   . Memory difficulties    short term memories  . Obesity   . On home oxygen therapy    on 2 liter  . OSA (obstructive sleep apnea)    not using cpap  . Pneumonia   . Pre-diabetes   . Prostatitis    Past Surgical History:  Procedure Laterality Date  . ABDOMINAL SURGERY    . ANKLE FRACTURE SURGERY Right   . ANTERIOR CERVICAL DECOMP/DISCECTOMY FUSION N/A 05/23/2019   Procedure: ANTERIOR CERVICAL DISCECTOMY FUSION CERVICAL FIVE THROUGH CERVICAL SIX AND CERVICAL SIX THROUGH CERVICAL SEVEN;  Surgeon: Jessy Oto, MD;  Location: [facility];  Service: Orthopedics;  Laterality: N/A;  . COLONOSCOPY    . CYSTOSCOPY     [place]  . KNEE ARTHROSCOPY WITH MEDIAL MENISECTOMY Left 01/02/2017   Procedure: LEFT KNEE ARTHROSCOPY WITH PARTIAL MEDIAL MENISCECTOMY;  Surgeon: Mcarthur Rossetti, MD;  Location: [facility];  Service: Orthopedics;  Laterality: Left;  . TONSILLECTOMY    . [place] RESECTION  2007  . UVULOPALATOPHARYNGOPLASTY     Review of Systems          Objective:   Physical Exam   BP 130/82 Comment: sitting-130/82 and 96/66-standing  Pulse 93   Temp (!) 97.2 F (36.2 C)   Resp 18   Ht 6' (1.829 m)   Wt (!) 344 lb 9.6 oz (156.3 kg)   BMI 46.74 kg/m   Postural  Sit  BP 115/78   P 87          &  Stand BP 106/69    P 92   HEENT - WNL. Neck - supple.  Chest - Clear equal BS. Cor - Nl HS. RRR w/o sig MGR. PP 1(+) w /1+ edema. MS- FROM w/o deformities.  Gait Nl. Towanda Malkin -  Nl w/o focal abnormalities.    Assessment & Plan:   1. Essential  hypertension  - Advised decrease Losartan 100 mg to 1/4 tab = 25 mg qd and  Monitor BP's  - CBC with Diff - COMPLETE METABOLIC PANEL WITH GFR  2. Tracheomalacia   3. Respiratory compromise  - ENT referral for Flex Laryngoscopy s/p acute respiratory compromise after ACDF  4. Type 2 diabetes mellitus with diabetic nephropathy, without long-term current use of insulin (HCC)  - COMPLETE METABOLIC PANEL WITH GFR  5. Polypharmacy  - Patient was agreeable to taking #10 non essential  meds or supplements off of his list   6. Medication management  - CBC with Diff - COMPLETE METABOLIC PANEL WITH GFR

## 2019-06-07 NOTE — Telephone Encounter (Signed)
Called patient on 06/07/2019 , 3:23 PM in an attempt to reach the patient for a hospital follow up.   Admit date: 05/26/19 Discharge: 05/28/19   He does have any questions or concerns about medications from the hospital admission. The patient's medications were reviewed over the phone, they were counseled to bring in all current medications to the hospital follow up visit. Patient has concerns about his blood pressure and was advised to see his PCP regarding his BP  I advised the patient to call if any questions or concerns arise about the hospital admission or medications    Home health was started in the hospital. A therapist is coming to the patient's home to help with his small muscle motor skills and will go to out patient therapy, when advised by the neurosurgeon. All questions were answered and a follow up appointment was made. Patient has an appointment 06/08/2019, with Dr Melford Aase, to discuss his BP issues.  Prior to Admission medications   Medication Sig Start Date End Date Taking? Authorizing Provider  acetaminophen (TYLENOL) 500 MG tablet Take 1,000 mg by mouth every 6 (six) hours as needed for moderate pain.    [provider]  albuterol (PROVENTIL HFA;VENTOLIN HFA) 108 (90 Base) MCG/ACT inhaler 1 to 2 inhalations 10 to 15 minutes apart every 4 hrs to rescue Asthma Patient taking differently: Inhale 1-2 puffs into the lungs every 4 (four) hours as needed for wheezing or shortness of breath.  02/24/18   [name], MD  alfuzosin (UROXATRAL) 10 MG 24 hr tablet Take 10 mg by mouth at bedtime.     [provider]  anastrozole (ARIMIDEX) 1 MG tablet Take 1 mg by mouth daily.  08/08/15   [provider]  ascorbic acid (VITAMIN C) 500 MG tablet Take 500 mg by mouth daily as needed (immune support).     [provider]  azelastine (ASTELIN) 0.1 % nasal spray Use 1 to 2 sprays  Each Nostril 2 x /day Patient taking differently: Place 2 sprays into both  nostrils 2 (two) times daily.  12/29/18   [name], MD  Azelastine-Fluticasone [phone] MCG/ACT SUSP 2 spray each nostril 1-2 times daily 05/03/19   [name], MD  baclofen (LIORESAL) 10 MG tablet Take 10 mg by mouth 3 (three) times daily as needed for muscle spasms.  03/03/16   [provider]  bisoprolol-hydrochlorothiazide (ZIAC) 5-6.25 MG tablet Take 1 tablet Daily for BP Patient taking differently: Take 1 tablet by mouth daily.  01/25/19   [name], MD  Botulinum Toxin Type A 200 units SOLR Inject 1 each into the skin every 3 (three) months.  09/07/14   [provider]  cetaphil (CETAPHIL) lotion Apply topically 2 (two) times daily. Patient taking differently: Apply 1 application topically 2 (two) times daily.  06/09/16   Lavina Hamman, MD  cetirizine (ZYRTEC) 10 MG tablet Take 10 mg by mouth daily.    [provider]  Cholecalciferol (VITAMIN D3) 250 MCG (10000 UT) capsule Take 10,000 Units by mouth 3 (three) times daily.    [provider]  CINNAMON PO Take 1,000 mg by mouth 3 (three) times daily.    [provider]  clotrimazole-betamethasone (LOTRISONE) cream APPLY TO AFFECTED AREA TWICE A DAY Patient taking differently: Apply 1 application topically 2 (two) times daily as needed (rash).  05/27/17   Vicie Mutters, PA-C  Coenzyme Q10 (COQ-10) 400 MG CAPS Take 400 mg by mouth daily.    [provider]  cyclobenzaprine (FLEXERIL) 10 MG tablet Take 10 mg by mouth 3 (three) times daily.     [provider]  donepezil (ARICEPT) 23 MG TABS tablet Take 23 mg by mouth at bedtime.    [provider]  doxycycline (VIBRAMYCIN) 100 MG capsule TAKE 1 CAPSULE BY MOUTH TWICE A DAY Patient taking differently: Take 100 mg by mouth 2 (two) times daily.  01/14/19   [name], MD  DUPIXENT 300 MG/2ML SOSY RX1: INJECT 2 SYRINGES UNDER THE SKIN (SUBCUTANEOUS INJECTION) ON DAY 1 , THEN 1 SYRINGE ON DAY 15 AND  EVERY OTHER WEEK THEREAFTER Patient taking differently: Inject 300 mg into the skin every 14 (fourteen) days.  11/17/18   [name], MD  Eluxadoline (VIBERZI) 100 MG TABS Take 100 mg by mouth 2 (two) times daily.     [provider]  EPINEPHrine 0.3 mg/0.3 mL IJ SOAJ injection Inject 0.3 mLs (0.3 mg total) into the muscle as needed. Patient taking differently: Inject 0.3 mg into the muscle as needed for anaphylaxis.  05/03/19   [name], MD  fluticasone [facility]) 50 MCG/ACT nasal spray Use 1 to 2 sprays each Nostril 2 x /day Patient taking differently: Place 2 sprays into both nostrils 2 (two) times daily.  12/29/18   [name], MD  Fluticasone-Umeclidin-Vilant (TRELEGY ELLIPTA) 100-62.5-25 MCG/INH AEPB Inhale 1 puff into the lungs daily. 05/03/19   [name], MD  furosemide (LASIX) 80 MG tablet TAKE 1 TO 1 & 1/2 TABLETS 2 X /DAY FOR FLUID RETENTION & SWELLING OF LEGS Patient taking differently: Take 80-120 mg by mouth 2 (two) times daily as needed for edema.  05/04/18   [name], MD  Guaifenesin [facility] MAXIMUM STRENGTH) 1200 MG TB12 Take 1,200 mg by mouth 2 (two) times daily.    [provider]  ipratropium-albuterol (DUONEB) 0.5-2.5 (3) MG/3ML SOLN Take 3 mLs by nebulization every 4 (four) hours as needed. Patient taking differently: Take 3 mLs by nebulization every 4 (four) hours as needed (shortness of breath).  11/04/18   [name], MD  KRILL OIL PO Take 1,000 mg by mouth 3 (three) times daily.     [provider]  l-methylfolate-B6-B12 (METANX) 3-35-2 MG TABS tablet Take 1 tablet by mouth daily. 06/29/18   [provider]  LUTEIN PO Take 20 mg by mouth daily.     [provider]  LYRICA 150 MG capsule Take 2 capsules (300 mg total) by mouth 2 (two) times daily. Patient taking differently: Take 150 mg by mouth 3 (three) times daily.  04/07/18   Liane Comber, NP  Magnesium 500 MG CAPS Take  500 mg by mouth daily with supper.    [provider]  memantine (NAMENDA) 10 MG tablet Take 20 mg by mouth at bedtime.     [provider]  metFORMIN (GLUCOPHAGE-XR) 500 MG 24 hr tablet Take 2 tablets 2 x /day with Breakfast & Supper  for Diabetes Patient taking differently: Take 1,000 mg by mouth 2 (two) times daily with a meal. Breakfast and dinner 04/02/19   [name], MD  methylPREDNISolone (MEDROL) 4 MG tablet Medrol dose pack. Take as instructed 05/28/19   Mcarthur Rossetti, MD  metolazone (ZAROXOLYN) 5 MG tablet Take 1 tablet daily for Fluid Retention Patient taking differently: Take 5 mg by mouth daily as needed (severe swelling).  03/19/18   [name], MD  Misc Natural Products (GLUCOS-CHONDROIT-MSM COMPLEX) TABS Take 1 tablet by  mouth 3 (three) times daily.    [provider]  montelukast (SINGULAIR) 10 MG tablet Take 1 tablet Daily for Allergies Patient taking differently: Take 10 mg by mouth at bedtime.  05/01/19   [name], MD  Multiple Minerals-Vitamins (CITRACAL MAXIMUM PLUS) TABS Take 1 tablet by mouth 3 (three) times daily.    [provider]  Multiple Vitamins-Minerals (MULTIVITAMIN WITH MINERALS) tablet Take 1 tablet by mouth daily.    [provider]  Multiple Vitamins-Minerals (PRESERVISION AREDS 2) CAPS Take 1 capsule by mouth 2 (two) times daily.    [provider]  naloxone [facility]) nasal spray 4 mg/0.1 mL Place 1 spray into the nose as needed (opioid overdose).  03/14/17   [provider]  ondansetron (ZOFRAN-ODT) 4 MG disintegrating tablet TAKE 1 TABLET (4 MG TOTAL) BY MOUTH EVERY 6 (SIX) HOURS AS NEEDED FOR NAUSEA OR VOMITING. 03/20/17   [name], MD  oxyCODONE-acetaminophen (PERCOCET) 7.5-325 MG tablet Take 1 tablet by mouth every 4 (four) hours as needed for severe pain. 06/06/19   Jessy Oto, MD  oxymetazoline (AFRIN) 0.05 % nasal spray Place 1 spray into both nostrils See  admin instructions. 1 SPRAY PER SIDE EACH NIGHT BEFORE DYMISTA    [provider]  phenol (CHLORASEPTIC) 1.4 % LIQD Use as directed 1 spray in the mouth or throat as needed for throat irritation / pain. Patient taking differently: Use as directed 1 spray in the mouth or throat daily as needed for throat irritation / pain.  05/24/19   Jessy Oto, MD  potassium chloride SA (KLOR-CON M20) 20 MEQ tablet Take 1 tablet 2 x  /day for Potassium Patient taking differently: Take 20 mEq by mouth 2 (two) times daily.  04/02/19   [name], MD  pravastatin (PRAVACHOL) 40 MG tablet Take 1 tablet Daily at Bedtime for Cholesterol Patient taking differently: Take 20 mg by mouth at bedtime.  05/01/19   [name], MD  PROBIOTIC PRODUCT PO Take 1 capsule by mouth 3 (three) times daily.     [provider]  promethazine-dextromethorphan (PROMETHAZINE-DM) 6.25-15 MG/5ML syrup Take 5 mLs by mouth 4 (four) times daily as needed for cough.    [provider]  sertraline (ZOLOFT) 50 MG tablet Take 50 mg by mouth 2 (two) times daily.     [provider]  sodium chloride (OCEAN) 0.65 % SOLN nasal spray Place 1 spray into both nostrils 4 (four) times daily as needed for congestion. Uses each time before other nasal sprays    [provider]  tadalafil (CIALIS) 5 MG tablet Take 5 mg by mouth daily.     [provider]  trospium (SANCTURA) 20 MG tablet Take 20 mg by mouth 2 (two) times daily.  06/22/15   [provider]  TURMERIC PO Take 1 capsule by mouth 3 (three) times daily.     [provider]  zinc gluconate 50 MG tablet Take 50 mg by mouth daily.    [provider]

## 2019-06-08 ENCOUNTER — Ambulatory Visit (INDEPENDENT_AMBULATORY_CARE_PROVIDER_SITE_OTHER): Payer: BC Managed Care – PPO | Admitting: Internal Medicine

## 2019-06-08 ENCOUNTER — Other Ambulatory Visit: Payer: Self-pay

## 2019-06-08 ENCOUNTER — Encounter: Payer: Self-pay | Admitting: Internal Medicine

## 2019-06-08 VITALS — BP 130/82 | HR 93 | Temp 97.2°F | Resp 18 | Ht 72.0 in | Wt 344.6 lb

## 2019-06-08 DIAGNOSIS — R0989 Other specified symptoms and signs involving the circulatory and respiratory systems: Secondary | ICD-10-CM

## 2019-06-08 DIAGNOSIS — F3341 Major depressive disorder, recurrent, in partial remission: Secondary | ICD-10-CM

## 2019-06-08 DIAGNOSIS — E1121 Type 2 diabetes mellitus with diabetic nephropathy: Secondary | ICD-10-CM | POA: Diagnosis not present

## 2019-06-08 DIAGNOSIS — J398 Other specified diseases of upper respiratory tract: Secondary | ICD-10-CM | POA: Diagnosis not present

## 2019-06-08 DIAGNOSIS — Z79899 Other long term (current) drug therapy: Secondary | ICD-10-CM

## 2019-06-08 DIAGNOSIS — J9611 Chronic respiratory failure with hypoxia: Secondary | ICD-10-CM

## 2019-06-08 DIAGNOSIS — I10 Essential (primary) hypertension: Secondary | ICD-10-CM

## 2019-06-08 DIAGNOSIS — Z6841 Body Mass Index (BMI) 40.0 and over, adult: Secondary | ICD-10-CM

## 2019-06-08 DIAGNOSIS — J449 Chronic obstructive pulmonary disease, unspecified: Secondary | ICD-10-CM

## 2019-06-08 LAB — COMPLETE METABOLIC PANEL WITH GFR
AG Ratio: 1.8 (calc) (ref 1.0–2.5)
ALT: 19 U/L (ref 9–46)
AST: 17 U/L (ref 10–35)
Albumin: 3.6 g/dL (ref 3.6–5.1)
Alkaline phosphatase (APISO): 67 U/L (ref 35–144)
BUN: 19 mg/dL (ref 7–25)
CO2: 33 mmol/L — ABNORMAL HIGH (ref 20–32)
Calcium: 10.1 mg/dL (ref 8.6–10.3)
Chloride: 100 mmol/L (ref 98–110)
Creat: 0.93 mg/dL (ref 0.70–1.33)
GFR, Est African American: 105 mL/min/{1.73_m2} (ref >=60)
GFR, Est Non African American: 90 mL/min/{1.73_m2} (ref >=60)
Globulin: 2 g/dL (calc) (ref 1.9–3.7)
Glucose, Bld: 122 mg/dL — ABNORMAL HIGH (ref 65–99)
Potassium: 4 mmol/L (ref 3.5–5.3)
Sodium: 141 mmol/L (ref 135–146)
Total Bilirubin: 1 mg/dL (ref 0.2–1.2)
Total Protein: 5.6 g/dL — ABNORMAL LOW (ref 6.1–8.1)

## 2019-06-08 LAB — CBC WITH DIFFERENTIAL/PLATELET
Absolute Monocytes: 763 cells/uL (ref 200–950)
Basophils Absolute: 49 cells/uL (ref 0–200)
Basophils Relative: 0.6 %
Eosinophils Absolute: 148 cells/uL (ref 15–500)
Eosinophils Relative: 1.8 %
HCT: 41.6 % (ref 38.5–50.0)
Hemoglobin: 13.9 g/dL (ref 13.2–17.1)
Lymphs Abs: 2378 cells/uL (ref 850–3900)
MCH: 29.4 pg (ref 27.0–33.0)
MCHC: 33.4 g/dL (ref 32.0–36.0)
MCV: 87.9 fL (ref 80.0–100.0)
MPV: 10.4 fL (ref 7.5–12.5)
Monocytes Relative: 9.3 %
Neutro Abs: 4863 cells/uL (ref 1500–7800)
Neutrophils Relative %: 59.3 %
Platelets: 270 10*3/uL (ref 140–400)
RBC: 4.73 10*6/uL (ref 4.20–5.80)
RDW: 12.9 % (ref 11.0–15.0)
Total Lymphocyte: 29 %
WBC: 8.2 10*3/uL (ref 3.8–10.8)

## 2019-06-08 MED ORDER — METFORMIN HCL ER 500 MG PO TB24: ORAL_TABLET | ORAL | 3 refills | Status: DC

## 2019-06-08 MED ORDER — PREGABALIN 150 MG PO CAPS: ORAL_CAPSULE | ORAL | Status: DC

## 2019-06-09 DIAGNOSIS — Z981 Arthrodesis status: Secondary | ICD-10-CM | POA: Diagnosis not present

## 2019-06-09 DIAGNOSIS — Z9981 Dependence on supplemental oxygen: Secondary | ICD-10-CM | POA: Diagnosis not present

## 2019-06-09 DIAGNOSIS — Z4789 Encounter for other orthopedic aftercare: Secondary | ICD-10-CM | POA: Diagnosis not present

## 2019-06-09 DIAGNOSIS — Z6841 Body Mass Index (BMI) 40.0 and over, adult: Secondary | ICD-10-CM | POA: Diagnosis not present

## 2019-06-09 DIAGNOSIS — J449 Chronic obstructive pulmonary disease, unspecified: Secondary | ICD-10-CM | POA: Diagnosis not present

## 2019-06-09 DIAGNOSIS — N400 Enlarged prostate without lower urinary tract symptoms: Secondary | ICD-10-CM | POA: Diagnosis not present

## 2019-06-09 DIAGNOSIS — E669 Obesity, unspecified: Secondary | ICD-10-CM | POA: Diagnosis not present

## 2019-06-09 DIAGNOSIS — M1991 Primary osteoarthritis, unspecified site: Secondary | ICD-10-CM | POA: Diagnosis not present

## 2019-06-09 DIAGNOSIS — G4733 Obstructive sleep apnea (adult) (pediatric): Secondary | ICD-10-CM | POA: Diagnosis not present

## 2019-06-09 DIAGNOSIS — K589 Irritable bowel syndrome without diarrhea: Secondary | ICD-10-CM | POA: Diagnosis not present

## 2019-06-09 DIAGNOSIS — M4802 Spinal stenosis, cervical region: Secondary | ICD-10-CM | POA: Diagnosis not present

## 2019-06-09 DIAGNOSIS — M50123 Cervical disc disorder at C6-C7 level with radiculopathy: Secondary | ICD-10-CM | POA: Diagnosis not present

## 2019-06-09 DIAGNOSIS — K219 Gastro-esophageal reflux disease without esophagitis: Secondary | ICD-10-CM | POA: Diagnosis not present

## 2019-06-09 DIAGNOSIS — F329 Major depressive disorder, single episode, unspecified: Secondary | ICD-10-CM | POA: Diagnosis not present

## 2019-06-09 DIAGNOSIS — M50122 Cervical disc disorder at C5-C6 level with radiculopathy: Secondary | ICD-10-CM | POA: Diagnosis not present

## 2019-06-09 DIAGNOSIS — F419 Anxiety disorder, unspecified: Secondary | ICD-10-CM | POA: Diagnosis not present

## 2019-06-10 ENCOUNTER — Inpatient Hospital Stay: Payer: BC Managed Care – PPO | Admitting: Specialist

## 2019-06-16 DIAGNOSIS — M50123 Cervical disc disorder at C6-C7 level with radiculopathy: Secondary | ICD-10-CM | POA: Diagnosis not present

## 2019-06-16 DIAGNOSIS — Z9981 Dependence on supplemental oxygen: Secondary | ICD-10-CM | POA: Diagnosis not present

## 2019-06-16 DIAGNOSIS — E669 Obesity, unspecified: Secondary | ICD-10-CM | POA: Diagnosis not present

## 2019-06-16 DIAGNOSIS — M4802 Spinal stenosis, cervical region: Secondary | ICD-10-CM | POA: Diagnosis not present

## 2019-06-16 DIAGNOSIS — M1991 Primary osteoarthritis, unspecified site: Secondary | ICD-10-CM | POA: Diagnosis not present

## 2019-06-16 DIAGNOSIS — F419 Anxiety disorder, unspecified: Secondary | ICD-10-CM | POA: Diagnosis not present

## 2019-06-16 DIAGNOSIS — K589 Irritable bowel syndrome without diarrhea: Secondary | ICD-10-CM | POA: Diagnosis not present

## 2019-06-16 DIAGNOSIS — F329 Major depressive disorder, single episode, unspecified: Secondary | ICD-10-CM | POA: Diagnosis not present

## 2019-06-16 DIAGNOSIS — M50122 Cervical disc disorder at C5-C6 level with radiculopathy: Secondary | ICD-10-CM | POA: Diagnosis not present

## 2019-06-16 DIAGNOSIS — Z6841 Body Mass Index (BMI) 40.0 and over, adult: Secondary | ICD-10-CM | POA: Diagnosis not present

## 2019-06-16 DIAGNOSIS — Z981 Arthrodesis status: Secondary | ICD-10-CM | POA: Diagnosis not present

## 2019-06-16 DIAGNOSIS — N400 Enlarged prostate without lower urinary tract symptoms: Secondary | ICD-10-CM | POA: Diagnosis not present

## 2019-06-16 DIAGNOSIS — J449 Chronic obstructive pulmonary disease, unspecified: Secondary | ICD-10-CM | POA: Diagnosis not present

## 2019-06-16 DIAGNOSIS — K219 Gastro-esophageal reflux disease without esophagitis: Secondary | ICD-10-CM | POA: Diagnosis not present

## 2019-06-16 DIAGNOSIS — G4733 Obstructive sleep apnea (adult) (pediatric): Secondary | ICD-10-CM | POA: Diagnosis not present

## 2019-06-16 DIAGNOSIS — Z4789 Encounter for other orthopedic aftercare: Secondary | ICD-10-CM | POA: Diagnosis not present

## 2019-06-17 DIAGNOSIS — K589 Irritable bowel syndrome without diarrhea: Secondary | ICD-10-CM | POA: Diagnosis not present

## 2019-06-17 DIAGNOSIS — J9611 Chronic respiratory failure with hypoxia: Secondary | ICD-10-CM | POA: Diagnosis not present

## 2019-06-17 DIAGNOSIS — F329 Major depressive disorder, single episode, unspecified: Secondary | ICD-10-CM | POA: Diagnosis not present

## 2019-06-17 DIAGNOSIS — M1991 Primary osteoarthritis, unspecified site: Secondary | ICD-10-CM | POA: Diagnosis not present

## 2019-06-17 DIAGNOSIS — M50123 Cervical disc disorder at C6-C7 level with radiculopathy: Secondary | ICD-10-CM | POA: Diagnosis not present

## 2019-06-17 DIAGNOSIS — Z6841 Body Mass Index (BMI) 40.0 and over, adult: Secondary | ICD-10-CM | POA: Diagnosis not present

## 2019-06-17 DIAGNOSIS — G4733 Obstructive sleep apnea (adult) (pediatric): Secondary | ICD-10-CM | POA: Diagnosis not present

## 2019-06-17 DIAGNOSIS — N400 Enlarged prostate without lower urinary tract symptoms: Secondary | ICD-10-CM | POA: Diagnosis not present

## 2019-06-17 DIAGNOSIS — J449 Chronic obstructive pulmonary disease, unspecified: Secondary | ICD-10-CM | POA: Diagnosis not present

## 2019-06-17 DIAGNOSIS — F419 Anxiety disorder, unspecified: Secondary | ICD-10-CM | POA: Diagnosis not present

## 2019-06-17 DIAGNOSIS — E669 Obesity, unspecified: Secondary | ICD-10-CM | POA: Diagnosis not present

## 2019-06-17 DIAGNOSIS — K219 Gastro-esophageal reflux disease without esophagitis: Secondary | ICD-10-CM | POA: Diagnosis not present

## 2019-06-17 DIAGNOSIS — Z9981 Dependence on supplemental oxygen: Secondary | ICD-10-CM | POA: Diagnosis not present

## 2019-06-17 DIAGNOSIS — Z981 Arthrodesis status: Secondary | ICD-10-CM | POA: Diagnosis not present

## 2019-06-17 DIAGNOSIS — M50122 Cervical disc disorder at C5-C6 level with radiculopathy: Secondary | ICD-10-CM | POA: Diagnosis not present

## 2019-06-17 DIAGNOSIS — M4802 Spinal stenosis, cervical region: Secondary | ICD-10-CM | POA: Diagnosis not present

## 2019-06-17 DIAGNOSIS — Z4789 Encounter for other orthopedic aftercare: Secondary | ICD-10-CM | POA: Diagnosis not present

## 2019-06-20 ENCOUNTER — Other Ambulatory Visit: Payer: Self-pay

## 2019-06-20 ENCOUNTER — Ambulatory Visit (INDEPENDENT_AMBULATORY_CARE_PROVIDER_SITE_OTHER): Payer: BC Managed Care – PPO | Admitting: Otolaryngology

## 2019-06-20 VITALS — Temp 97.5°F

## 2019-06-20 DIAGNOSIS — K589 Irritable bowel syndrome without diarrhea: Secondary | ICD-10-CM | POA: Diagnosis not present

## 2019-06-20 DIAGNOSIS — J398 Other specified diseases of upper respiratory tract: Secondary | ICD-10-CM | POA: Diagnosis not present

## 2019-06-20 DIAGNOSIS — Z9981 Dependence on supplemental oxygen: Secondary | ICD-10-CM | POA: Diagnosis not present

## 2019-06-20 DIAGNOSIS — G4733 Obstructive sleep apnea (adult) (pediatric): Secondary | ICD-10-CM | POA: Diagnosis not present

## 2019-06-20 DIAGNOSIS — E669 Obesity, unspecified: Secondary | ICD-10-CM | POA: Diagnosis not present

## 2019-06-20 DIAGNOSIS — J310 Chronic rhinitis: Secondary | ICD-10-CM

## 2019-06-20 DIAGNOSIS — F419 Anxiety disorder, unspecified: Secondary | ICD-10-CM | POA: Diagnosis not present

## 2019-06-20 DIAGNOSIS — Z6841 Body Mass Index (BMI) 40.0 and over, adult: Secondary | ICD-10-CM | POA: Diagnosis not present

## 2019-06-20 DIAGNOSIS — Z4789 Encounter for other orthopedic aftercare: Secondary | ICD-10-CM | POA: Diagnosis not present

## 2019-06-20 DIAGNOSIS — K219 Gastro-esophageal reflux disease without esophagitis: Secondary | ICD-10-CM | POA: Diagnosis not present

## 2019-06-20 DIAGNOSIS — F329 Major depressive disorder, single episode, unspecified: Secondary | ICD-10-CM | POA: Diagnosis not present

## 2019-06-20 DIAGNOSIS — N400 Enlarged prostate without lower urinary tract symptoms: Secondary | ICD-10-CM | POA: Diagnosis not present

## 2019-06-20 DIAGNOSIS — Z981 Arthrodesis status: Secondary | ICD-10-CM | POA: Diagnosis not present

## 2019-06-20 DIAGNOSIS — M4802 Spinal stenosis, cervical region: Secondary | ICD-10-CM | POA: Diagnosis not present

## 2019-06-20 DIAGNOSIS — M50123 Cervical disc disorder at C6-C7 level with radiculopathy: Secondary | ICD-10-CM | POA: Diagnosis not present

## 2019-06-20 DIAGNOSIS — J449 Chronic obstructive pulmonary disease, unspecified: Secondary | ICD-10-CM | POA: Diagnosis not present

## 2019-06-20 DIAGNOSIS — M50122 Cervical disc disorder at C5-C6 level with radiculopathy: Secondary | ICD-10-CM | POA: Diagnosis not present

## 2019-06-20 DIAGNOSIS — M1991 Primary osteoarthritis, unspecified site: Secondary | ICD-10-CM | POA: Diagnosis not present

## 2019-06-20 NOTE — Progress Notes (Signed)
HPI: Mark Harrell is a 59 y.o. male who presents is referred by his PCP for evaluation of tracheomalacia noted on recent CT scan.  Patient has had a long history of obstructive sleep apnea.  He recently underwent anterior cervical fusion because of chronic neck problems and a couple days following surgery had a lot of difficulty swallowing and was seen emergently in the ED where he had a CT scan that demonstrated swelling around the previous surgery as well as tracheomalacia.  Patient is swallowing much better presently and has followed up with his surgeon.  He is having no hoarseness and no difficulty breathing presently.  However he does complain of intermittent nasal congestion although he is using Flonase and azelastine on a regular basis as well as saline irrigation.  He presents here for upper airway evaluation..  Past Medical History:  Diagnosis Date  . Anesthesia complication requiring reversal agent administration    ? from central apnea, very difficult to get off vent  . Anxiety   . Arthritis    osteo  . Asthma   . BPH (benign prostatic hyperplasia)   . Complication of anesthesia    difficulty waking , they twlight me because of my respiratory problems "  . Depression   . Dyspnea    on exertion  . Enlarged heart   . Family history of adverse reaction to anesthesia    mother trouble waking up, and heart stopped  . GERD (gastroesophageal reflux disease)   . Headache    botox injections for headaches  . Hyperlipidemia   . Hypertension   . Hypogonadism male   . IBS (irritable bowel syndrome)   . Memory difficulties    short term memories  . Obesity   . On home oxygen therapy    on 2 liter  . OSA (obstructive sleep apnea)    not using cpap  . Pneumonia   . Pre-diabetes   . Prostatitis    Past Surgical History:  Procedure Laterality Date  . ABDOMINAL SURGERY    . ANKLE FRACTURE SURGERY Right   . ANTERIOR CERVICAL DECOMP/DISCECTOMY FUSION N/A 05/23/2019   Procedure:  ANTERIOR CERVICAL DISCECTOMY FUSION CERVICAL FIVE THROUGH CERVICAL SIX AND CERVICAL SIX THROUGH CERVICAL SEVEN;  Surgeon: Jessy Oto, MD;  Location: [facility];  Service: Orthopedics;  Laterality: N/A;  . COLONOSCOPY    . CYSTOSCOPY     [place]  . KNEE ARTHROSCOPY WITH MEDIAL MENISECTOMY Left 01/02/2017   Procedure: LEFT KNEE ARTHROSCOPY WITH PARTIAL MEDIAL MENISCECTOMY;  Surgeon: Mcarthur Rossetti, MD;  Location: [facility];  Service: Orthopedics;  Laterality: Left;  . TONSILLECTOMY    . [place] RESECTION  2007  . UVULOPALATOPHARYNGOPLASTY     Social History   Socioeconomic History  . Marital status: Single    Spouse name: Not on file  . Number of children: Not on file  . Years of education: Not on file  . Highest education level: Not on file  Occupational History  . Occupation: Dance movement psychotherapist  Tobacco Use  . Smoking status: Former Smoker    Packs/day: 0.10    Years: 15.00    Pack years: 1.50  . Smokeless tobacco: Never Used  . Tobacco comment: significant second-hand exposure through mother  Substance and Sexual Activity  . Alcohol use: Yes    Alcohol/week: 1.0 standard drinks    Types: 1 Glasses of wine per week    Comment: 2 x a year  . Drug use: No  . Sexual  activity: Never  Other Topics Concern  . Not on file  Social History Narrative   [facility]:   Originally from [place]. Previously has lived in [place]. He has lived in [place], [place], & [place]. He has worked in Engineer, production. No pets currently. Brief exposure to a roommates bird Secretary/administrator) in college. No mold, asbestos, or hot tub exposure.    Social Determinants of Health   Financial Resource Strain:   . Difficulty of Paying Living Expenses: Not on file  Food Insecurity:   . Worried About Charity fundraiser in the Last Year: Not on file  . Ran Out of Food in the Last Year: Not on file  Transportation Needs:   . Lack of Transportation (Medical): Not on file  . Lack of  Transportation (Non-Medical): Not on file  Physical Activity:   . Days of Exercise per Week: Not on file  . Minutes of Exercise per Session: Not on file  Stress:   . Feeling of Stress : Not on file  Social Connections:   . Frequency of Communication with Friends and Family: Not on file  . Frequency of Social Gatherings with Friends and Family: Not on file  . Attends Religious Services: Not on file  . Active Member of Clubs or Organizations: Not on file  . Attends Archivist Meetings: Not on file  . Marital Status: Not on file   Family History  Problem Relation Age of Onset  . Diabetes Paternal Uncle   . Cancer Father        lymphoma, colon  . Diabetes Maternal Grandmother   . Heart disease Maternal Grandfather   . Diabetes Maternal Grandfather   . Diabetes Paternal Grandmother   . Diabetes Paternal Grandfather   . Dementia Mother   . Prostate cancer Maternal Uncle   . Lung disease Neg Hx   . Rheumatologic disease Neg Hx    Allergies  Allergen Reactions  . Bee Venom Swelling  . Duloxetine Shortness Of Breath    Brought on asthma  . Ppd [Tuberculin Purified Protein Derivative] Other (See Comments)    +ppd NEG Quantferron Gold 3/13 (shows false positive)   . Fenofibrate Other (See Comments)    Back pain  . Verapamil Other (See Comments)    Back pain  . Claritin [Loratadine] Other (See Comments)    UNKNOWN   . Levofloxacin Diarrhea  . Other Diarrhea    "Some antibiotics" cause diarrhea   Prior to Admission medications   Medication Sig Start Date End Date Taking? Authorizing Provider  acetaminophen (TYLENOL) 500 MG tablet Take 1,000 mg by mouth every 6 (six) hours as needed for moderate pain.   Yes [provider]  albuterol (PROVENTIL HFA;VENTOLIN HFA) 108 (90 Base) MCG/ACT inhaler 1 to 2 inhalations 10 to 15 minutes apart every 4 hrs to rescue Asthma Patient taking differently: Inhale 1-2 puffs into the lungs every 4 (four) hours as needed for  wheezing or shortness of breath.  02/24/18  Yes [name], MD  alfuzosin (UROXATRAL) 10 MG 24 hr tablet Take 10 mg by mouth at bedtime.    Yes [provider]  ascorbic acid (VITAMIN C) 500 MG tablet Take 500 mg by mouth daily as needed (immune support).    Yes [provider]  azelastine (ASTELIN) 0.1 % nasal spray Use 1 to 2 sprays  Each Nostril 2 x /day Patient taking differently: Place 2 sprays into both nostrils 2 (two) times  daily.  12/29/18  Yes [name], MD  bisoprolol-hydrochlorothiazide [facility]) 5-6.25 MG tablet Take 1 tablet Daily for BP Patient taking differently: Take 1 tablet by mouth daily.  01/25/19  Yes [name], MD  Botulinum Toxin Type A 200 units SOLR Inject 1 each into the skin every 3 (three) months.  09/07/14  Yes [provider]  cetaphil (CETAPHIL) lotion Apply topically 2 (two) times daily. Patient taking differently: Apply 1 application topically 2 (two) times daily.  06/09/16  Yes Lavina Hamman, MD  cetirizine (ZYRTEC) 10 MG tablet Take 10 mg by mouth daily.   Yes [provider]  Cholecalciferol (VITAMIN D3) 250 MCG (10000 UT) capsule Take 10,000 Units by mouth 3 (three) times daily.   Yes [provider]  CINNAMON PO Take 1,000 mg by mouth 3 (three) times daily.   Yes [provider]  clotrimazole-betamethasone (LOTRISONE) cream APPLY TO AFFECTED AREA TWICE A DAY Patient taking differently: Apply 1 application topically 2 (two) times daily as needed (rash).  05/27/17  Yes Vicie Mutters, PA-C  cyclobenzaprine (FLEXERIL) 10 MG tablet Take 10 mg by mouth 3 (three) times daily.    Yes [provider]  donepezil (ARICEPT) 23 MG TABS tablet Take 23 mg by mouth at bedtime.   Yes [provider]  doxycycline (VIBRAMYCIN) 100 MG capsule TAKE 1 CAPSULE BY MOUTH TWICE A DAY Patient taking differently: Take 100 mg by mouth 2 (two) times daily.  01/14/19  Yes [name], MD   DUPIXENT 300 MG/2ML SOSY RX1: INJECT 2 SYRINGES UNDER THE SKIN (SUBCUTANEOUS INJECTION) ON DAY 1 , THEN 1 SYRINGE ON DAY 15 AND EVERY OTHER WEEK THEREAFTER Patient taking differently: Inject 300 mg into the skin every 14 (fourteen) days.  11/17/18  Yes [name], MD  Eluxadoline (VIBERZI) 100 MG TABS Take 100 mg by mouth 2 (two) times daily.    Yes [provider]  EPINEPHrine 0.3 mg/0.3 mL IJ SOAJ injection Inject 0.3 mLs (0.3 mg total) into the muscle as needed. Patient taking differently: Inject 0.3 mg into the muscle as needed for anaphylaxis.  05/03/19  Yes [name], MD  fluticasone [facility]) 50 MCG/ACT nasal spray Use 1 to 2 sprays each Nostril 2 x /day Patient taking differently: Place 2 sprays into both nostrils 2 (two) times daily.  12/29/18  Yes [name], MD  Fluticasone-Umeclidin-Vilant (TRELEGY ELLIPTA) 100-62.5-25 MCG/INH AEPB Inhale 1 puff into the lungs daily. 05/03/19  Yes [name], MD  furosemide (LASIX) 80 MG tablet TAKE 1 TO 1 & 1/2 TABLETS 2 X /DAY FOR FLUID RETENTION & SWELLING OF LEGS Patient taking differently: Take 80-120 mg by mouth 2 (two) times daily as needed for edema.  05/04/18  Yes [name], MD  Guaifenesin [facility] MAXIMUM STRENGTH) 1200 MG TB12 Take 1,200 mg by mouth 2 (two) times daily.   Yes [provider]  ipratropium-albuterol (DUONEB) 0.5-2.5 (3) MG/3ML SOLN Take 3 mLs by nebulization every 4 (four) hours as needed. Patient taking differently: Take 3 mLs by nebulization every 4 (four) hours as needed (shortness of breath).  11/04/18  Yes [name], MD  KRILL OIL PO Take 1,000 mg by mouth 3 (three) times daily.    Yes [provider]  LUTEIN PO Take 20 mg by mouth daily.    Yes [provider]  Magnesium 500 MG CAPS Take 500 mg by mouth daily with supper.   Yes [provider]  memantine (NAMENDA) 10  MG tablet Take 20 mg by mouth at bedtime.    Yes [provider]  metFORMIN (GLUCOPHAGE-XR) 500 MG 24 hr tablet Takes1 tablets 2 x /day with Breakfast & Supper  for Diabetes 06/08/19  Yes [name], MD  Misc Natural Products (GLUCOS-CHONDROIT-MSM COMPLEX) TABS Take 1 tablet by mouth 3 (three) times daily.   Yes [provider]  montelukast (SINGULAIR) 10 MG tablet Take 1 tablet Daily for Allergies Patient taking differently: Take 10 mg by mouth at bedtime.  05/01/19  Yes [name], MD  Multiple Vitamins-Minerals (MULTIVITAMIN WITH MINERALS) tablet Take 1 tablet by mouth daily.   Yes [provider]  naloxone (NARCAN) nasal spray 4 mg/0.1 mL Place 1 spray into the nose as needed (opioid overdose).  03/14/17  Yes [provider]  ondansetron (ZOFRAN-ODT) 4 MG disintegrating tablet TAKE 1 TABLET (4 MG TOTAL) BY MOUTH EVERY 6 (SIX) HOURS AS NEEDED FOR NAUSEA OR VOMITING. 03/20/17  Yes [name], MD  oxyCODONE-acetaminophen (PERCOCET) 7.5-325 MG tablet Take 1 tablet by mouth every 4 (four) hours as needed for severe pain. 06/06/19  Yes Jessy Oto, MD  oxymetazoline (AFRIN) 0.05 % nasal spray Place 1 spray into both nostrils See admin instructions. 1 SPRAY PER SIDE EACH NIGHT BEFORE DYMISTA   Yes [provider]  potassium chloride SA (KLOR-CON M20) 20 MEQ tablet Take 1 tablet 2 x  /day for Potassium Patient taking differently: Take 20 mEq by mouth 2 (two) times daily.  04/02/19  Yes [name], MD  pravastatin (PRAVACHOL) 40 MG tablet Take 1 tablet Daily at Bedtime for Cholesterol Patient taking differently: Take 20 mg by mouth at bedtime.  05/01/19  Yes [name], MD  pregabalin (LYRICA) 150 MG capsule Takes 1 capsule 3 x /day 06/08/19  Yes [name], MD  PROBIOTIC PRODUCT PO Take 1 capsule by mouth 3 (three) times daily.    Yes [provider]  promethazine-dextromethorphan (PROMETHAZINE-DM) 6.25-15 MG/5ML syrup Take 5 mLs by mouth 4 (four) times daily as needed for  cough.   Yes [provider]  sertraline (ZOLOFT) 50 MG tablet Take 50 mg by mouth 2 (two) times daily.    Yes [provider]  sodium chloride (OCEAN) 0.65 % SOLN nasal spray Place 1 spray into both nostrils 4 (four) times daily as needed for congestion. Uses each time before other nasal sprays   Yes [provider]  tadalafil (CIALIS) 5 MG tablet Take 5 mg by mouth daily.    Yes [provider]  TURMERIC PO Take 1 capsule by mouth 3 (three) times daily.    Yes [provider]  zinc gluconate 50 MG tablet Take 50 mg by mouth daily.   Yes [provider]     Positive ROS: Otherwise negative  All other systems have been reviewed and were otherwise negative with the exception of those mentioned in the HPI and as above.  Physical Exam: Constitutional: Alert, well-appearing, no acute distress Ears: External ears without lesions or tenderness. Ear canals are clear bilaterally with intact, clear TMs.  Nasal: External nose without lesions. Septum with mild deformity.  Moderate rhinitis but no real obstruction today.  Both middle meatus regions were clear with no signs of infection.  No polyps noted. Fiberoptic laryngoscopy was performed through the right nostril.  Nasopharynx was clear.  Base of tongue vallecula epiglottis were normal.  Vocal cords were clear bilaterally with normal vocal cord mobility.  Subglottis was clear in the trachea  could be well visualized for approximately 4-5 tracheal rings with no collapse or narrowing.  Patient does have mild arytenoid edema consistent with laryngeal pharyngeal reflux disease. Oral: Lips and gums without lesions. Tongue and palate mucosa without lesions.  Patient is status post UPPP and tonsillectomy with clear oropharynx. Neck: No palpable adenopathy or masses Respiratory: Breathing comfortably.  No stridor. Skin: No facial/neck lesions or rash noted.  Laryngoscopy  Date/Time: 06/20/2019 4:57  PM Performed by: Rozetta Nunnery, MD Authorized by: Rozetta Nunnery, MD   Consent:    Consent obtained:  Verbal   Consent given by:  Patient   Risks discussed:  Pain Procedure details:    Indications: assessment of airway     Medication:  Afrin   Instrument: flexible fiberoptic laryngoscope     Scope location: right nare   Mouth:    Oropharynx: normal     Vallecula: normal     Base of tongue: normal     Epiglottis: normal   Throat:    Pyriform sinus: normal     True vocal cords: normal   Comments:     Glottis and subglottis were clear with no obstruction.  The trachea could be visualized down about 4-5 tracheal rings with no collapse noted.  Of note patient does have moderate edema over the arytenoid mucosa consistent with laryngeal pharyngeal reflux disease.    Assessment: Chronic rhinitis. Clear upper airway examination. Tracheomalacia noted on recent CT scan  Plan: Concerning his nasal obstruction recommended continue use of the Flonase and azelastine spray as well as use of saline irrigation.  He can occasionally use Afrin at night 2 or 3 times per week if he has a lot of trouble breathing through his nose.  Would also recommend elevation of the head of the bed and reviewed this with him. He follows up with pulmonary if he has any trouble breathing would recommend follow-up with pulmonary.  No structural abnormality noted on fiberoptic laryngoscopy and evaluation of the subglottis and upper trachea. He does have findings consistent with laryngeal pharyngeal reflux disease but is on antiacid therapy.   Radene Journey, MD   CC:

## 2019-06-20 NOTE — Discharge Summary (Signed)
Patient ID: Mark Harrell MRN: [id number] DOB/AGE: 1960/12/29 59 y.o.  Admit date: 05/26/2019 Discharge date: 06/20/2019  Admission Diagnoses:  Active Problems:   Tracheomalacia   Acquired tracheomalacia   Discharge Diagnoses:  Active Problems:   Tracheomalacia   Acquired tracheomalacia  status post   Past Medical History:  Diagnosis Date  . Anesthesia complication requiring reversal agent administration    ? from central apnea, very difficult to get off vent  . Anxiety   . Arthritis    osteo  . Asthma   . BPH (benign prostatic hyperplasia)   . Complication of anesthesia    difficulty waking , they twlight me because of my respiratory problems "  . Depression   . Dyspnea    on exertion  . Enlarged heart   . Family history of adverse reaction to anesthesia    mother trouble waking up, and heart stopped  . GERD (gastroesophageal reflux disease)   . Headache    botox injections for headaches  . Hyperlipidemia   . Hypertension   . Hypogonadism male   . IBS (irritable bowel syndrome)   . Memory difficulties    short term memories  . Obesity   . On home oxygen therapy    on 2 liter  . OSA (obstructive sleep apnea)    not using cpap  . Pneumonia   . Pre-diabetes   . Prostatitis     Surgeries:  on * No surgery found *   Consultants: Treatment Team:  Jessy Oto, MD  Discharged Condition: Improved  Hospital Course: Mark Harrell is an 59 y.o. male who was admitted 05/26/2019 for operative treatment of tracheomalacia. Patient failed conservative treatments (please see the history and physical for the specifics) and had severe unremitting pain that affects sleep, daily activities and work/hobbies. After pre-op clearance, the patient was taken to the operating room on * No surgery found * and underwent  .    Patient was given perioperative antibiotics:  Anti-infectives (From admission, onward)   None       Patient was given sequential compression devices  and early ambulation to prevent DVT.   Patient benefited maximally from hospital stay and there were no complications. At the time of discharge, the patient was urinating/moving their bowels without difficulty, tolerating a regular diet, pain is controlled with oral pain medications and they have been cleared by PT/OT.   Recent vital signs: No data found.   Recent laboratory studies: No results for input(s): WBC, HGB, HCT, PLT, NA, K, CL, CO2, BUN, CREATININE, GLUCOSE, INR, CALCIUM in the last 72 hours.  Invalid input(s): PT, 2   Discharge Medications:   Allergies as of 05/28/2019      Reactions   Bee Venom Swelling   Duloxetine Shortness Of Breath   Brought on asthma   Ppd [tuberculin Purified Protein Derivative] Other (See Comments)   +ppd NEG Quantferron Gold 3/13 (shows false positive)    Fenofibrate Other (See Comments)   Back pain   Verapamil Other (See Comments)   Back pain   Claritin [loratadine] Other (See Comments)   UNKNOWN    Levofloxacin Diarrhea   Other Diarrhea   "Some antibiotics" cause diarrhea      Medication List    STOP taking these medications   oxyCODONE 5 MG immediate release tablet Commonly known as: Oxy IR/ROXICODONE     TAKE these medications   acetaminophen 500 MG tablet Commonly known as: TYLENOL Take 1,000 mg by mouth  every 6 (six) hours as needed for moderate pain.   albuterol 108 (90 Base) MCG/ACT inhaler Commonly known as: VENTOLIN HFA 1 to 2 inhalations 10 to 15 minutes apart every 4 hrs to rescue Asthma What changed:   how much to take  how to take this  when to take this  reasons to take this  additional instructions   alfuzosin 10 MG 24 hr tablet Commonly known as: UROXATRAL Take 10 mg by mouth at bedtime.   ascorbic acid 500 MG tablet Commonly known as: VITAMIN C Take 500 mg by mouth daily as needed (immune support).   azelastine 0.1 % nasal spray Commonly known as: ASTELIN Use 1 to 2 sprays  Each Nostril 2 x  /day What changed:   how much to take  how to take this  when to take this  additional instructions   bisoprolol-hydrochlorothiazide 5-6.25 MG tablet Commonly known as: ZIAC Take 1 tablet Daily for BP What changed:   how much to take  how to take this  when to take this  additional instructions   Botulinum Toxin Type A 200 units Solr Inject 1 each into the skin every 3 (three) months.   cetaphil lotion Apply topically 2 (two) times daily. What changed: how much to take   cetirizine 10 MG tablet Commonly known as: ZYRTEC Take 10 mg by mouth daily.   CINNAMON PO Take 1,000 mg by mouth 3 (three) times daily.   clotrimazole-betamethasone cream Commonly known as: LOTRISONE APPLY TO AFFECTED AREA TWICE A DAY What changed: See the new instructions.   cyclobenzaprine 10 MG tablet Commonly known as: FLEXERIL Take 10 mg by mouth 3 (three) times daily.   donepezil 23 MG Tabs tablet Commonly known as: ARICEPT Take 23 mg by mouth at bedtime.   doxycycline 100 MG capsule Commonly known as: VIBRAMYCIN TAKE 1 CAPSULE BY MOUTH TWICE A DAY   Dupixent 300 MG/2ML prefilled syringe Generic drug: dupilumab RX1: INJECT 2 SYRINGES UNDER THE SKIN (SUBCUTANEOUS INJECTION) ON DAY 1 , THEN 1 SYRINGE ON DAY 15 AND EVERY OTHER WEEK THEREAFTER What changed: See the new instructions.   EPINEPHrine 0.3 mg/0.3 mL Soaj injection Commonly known as: EPI-PEN Inject 0.3 mLs (0.3 mg total) into the muscle as needed. What changed: reasons to take this   fluticasone 50 MCG/ACT nasal spray Commonly known as: Flonase Use 1 to 2 sprays each Nostril 2 x /day What changed:   how much to take  how to take this  when to take this  additional instructions   furosemide 80 MG tablet Commonly known as: LASIX TAKE 1 TO 1 & 1/2 TABLETS 2 X /DAY FOR FLUID RETENTION & SWELLING OF LEGS What changed: See the new instructions.   Glucos-Chondroit-MSM Complex Tabs Take 1 tablet by mouth 3  (three) times daily.   ipratropium-albuterol 0.5-2.5 (3) MG/3ML Soln Commonly known as: DUONEB Take 3 mLs by nebulization every 4 (four) hours as needed. What changed: reasons to take this   KRILL OIL PO Take 1,000 mg by mouth 3 (three) times daily.   LUTEIN PO Take 20 mg by mouth daily.   Magnesium 500 MG Caps Take 500 mg by mouth daily with supper.   montelukast 10 MG tablet Commonly known as: SINGULAIR Take 1 tablet Daily for Allergies What changed:   how much to take  how to take this  when to take this  additional instructions   Mucinex Maximum Strength 1200 MG Tb12 Generic drug: Guaifenesin Take 1,200  mg by mouth 2 (two) times daily.   multivitamin with minerals tablet Take 1 tablet by mouth daily.   Namenda 10 MG tablet Generic drug: memantine Take 20 mg by mouth at bedtime.   Narcan 4 MG/0.1ML Liqd nasal spray kit Generic drug: naloxone Place 1 spray into the nose as needed (opioid overdose).   ondansetron 4 MG disintegrating tablet Commonly known as: ZOFRAN-ODT TAKE 1 TABLET (4 MG TOTAL) BY MOUTH EVERY 6 (SIX) HOURS AS NEEDED FOR NAUSEA OR VOMITING.   oxymetazoline 0.05 % nasal spray Commonly known as: AFRIN Place 1 spray into both nostrils See admin instructions. 1 SPRAY PER SIDE EACH NIGHT BEFORE DYMISTA   potassium chloride SA 20 MEQ tablet Commonly known as: Klor-Con M20 Take 1 tablet 2 x  /day for Potassium What changed:   how much to take  how to take this  when to take this  additional instructions   pravastatin 40 MG tablet Commonly known as: PRAVACHOL Take 1 tablet Daily at Bedtime for Cholesterol What changed:   how much to take  how to take this  when to take this  additional instructions   PROBIOTIC PRODUCT PO Take 1 capsule by mouth 3 (three) times daily.   promethazine-dextromethorphan 6.25-15 MG/5ML syrup Commonly known as: PROMETHAZINE-DM Take 5 mLs by mouth 4 (four) times daily as needed for cough.    sertraline 50 MG tablet Commonly known as: ZOLOFT Take 50 mg by mouth 2 (two) times daily.   sodium chloride 0.65 % Soln nasal spray Commonly known as: OCEAN Place 1 spray into both nostrils 4 (four) times daily as needed for congestion. Uses each time before other nasal sprays   tadalafil 5 MG tablet Commonly known as: CIALIS Take 5 mg by mouth daily.   Trelegy Ellipta 100-62.5-25 MCG/INH Aepb Generic drug: Fluticasone-Umeclidin-Vilant Inhale 1 puff into the lungs daily.   TURMERIC PO Take 1 capsule by mouth 3 (three) times daily.   Viberzi 100 MG Tabs Generic drug: Eluxadoline Take 100 mg by mouth 2 (two) times daily.   Vitamin D3 250 MCG (10000 UT) capsule Take 10,000 Units by mouth 3 (three) times daily.   zinc gluconate 50 MG tablet Take 50 mg by mouth daily.     ASK your doctor about these medications   oxyCODONE 5 MG immediate release tablet Commonly known as: Oxy IR/ROXICODONE Take 1 tablet (5 mg total) by mouth every 4 (four) hours as needed for up to 7 days for moderate pain or breakthrough pain ((score 4 to 6)). Ask about: Should I take this medication?       Diagnostic Studies: DG Cervical Spine 2 or 3 views  Result Date: 05/24/2019 CLINICAL DATA:  Postop fusion EXAM: CERVICAL SPINE - 2-3 VIEW COMPARISON:  None. FINDINGS: Changes of anterior fusion from C5-C7. Normal alignment. No hardware complicating feature. Degenerative disc disease at C4-5. Mild prevertebral soft tissue swelling at the surgical level compatible with postoperative changes. IMPRESSION: Postoperative changes from C5-C7 ACDF. Mild prevertebral soft tissue swelling. No hardware complicating feature. Electronically Signed   By: Rolm Baptise M.D.   On: 05/24/2019 08:52   DG Cervical Spine 2-3 Views  Result Date: 05/23/2019 CLINICAL DATA:  ACDF C5-C7. EXAM: CERVICAL SPINE - 2-3 VIEW; DG C-ARM 1-60 MIN COMPARISON:  03/06/2019 MRI cervical spine and 12/23/2018 cervical spine radiographs  FLUOROSCOPY TIME:  Fluoroscopy Time:  0 minutes 18 seconds Number of Acquired Spot Images: 3 FINDINGS: Nondiagnostic spot fluoroscopic intraoperative radiographs demonstrate postsurgical changes from ACDF C5-C7.  IMPRESSION: Intraoperative fluoroscopic guidance for ACDF C5-C7. Electronically Signed   By: Ilona Sorrel M.D.   On: 05/23/2019 12:14   CT Soft Tissue Neck W Contrast  Result Date: 05/26/2019 CLINICAL DATA:  Short of breath and trouble swallowing. ACDF 05/23/2019 EXAM: CT NECK WITH CONTRAST TECHNIQUE: Multidetector CT imaging of the neck was performed using the standard protocol following the bolus administration of intravenous contrast. CONTRAST:  120m OMNIPAQUE IOHEXOL 350 MG/ML SOLN COMPARISON:  MRI cervical spine 04/17/2018.  CT chest 01/04/2017 FINDINGS: Pharynx and larynx: Retropharyngeal soft tissue swelling displaces the pharynx anteriorly. This extends from C2 through C4. Recent ACDF C5-6 and C6-7. Pharyngeal airway patent. Epiglottis and larynx normal. Proximal trachea is significantly narrowed. There is anterior bowing of the posterior wall of the trachea by the esophagus. Tracheal luminal narrowing is greater than 50%. Salivary glands: No inflammation, mass, or stone. Thyroid: Normal thyroid. Soft tissue swelling around the left lobe of the thyroid from recent ACDF surgery. Lymph nodes: No enlarged lymph nodes in the neck. Vascular: Normal vascular enhancement. Limited intracranial: Negative Visualized orbits: Not imaged Mastoids and visualized paranasal sinuses: Negative Skeleton: ACDF C5-6 and C6-7. Hardware interbody bone graft in good position. No acute skeletal abnormality. Normal alignment. Upper chest: Lung apices clear bilaterally. Other: None IMPRESSION: Moderate prevertebral soft tissue swelling C2 through C4. This presumably related to recent ACDF C5-6 and C6-7. The pharynx is displaced anteriorly without significant compromise of the pharyngeal airway. ACDF C5-6 and C6-7.  Hardware in good position. Mild soft tissue swelling in the left neck from recent surgery. Severe tracheomalacia with flattening of the posterior wall of the trachea. Electronically Signed   By: CFranchot GalloM.D.   On: 05/26/2019 08:07   CT Angio Chest PE W and/or Wo Contrast  Result Date: 05/26/2019 CLINICAL DATA:  Shortness of breath.  Recent cervical spine surgery EXAM: CT ANGIOGRAPHY CHEST WITH CONTRAST TECHNIQUE: Multidetector CT imaging of the chest was performed using the standard protocol during bolus administration of intravenous contrast. Multiplanar CT image reconstructions and MIPs were obtained to evaluate the vascular anatomy. CONTRAST:  1068mOMNIPAQUE IOHEXOL 350 MG/ML SOLN COMPARISON:  Chest radiograph May 26, 2019; CT angiogram chest January 04, 2017 FINDINGS: Cardiovascular: There is no demonstrable pulmonary embolus. There is no appreciable thoracic aortic aneurysm or dissection. The visualized great vessels appear unremarkable. There is no pericardial effusion or pericardial thickening. There are occasional foci of coronary artery calcification. The main pulmonary outflow tract is enlarged, measuring 4.2 cm in diameter. Mediastinum/Nodes: Foci of air in the left neck region may well be of postoperative etiology. Visualized thyroid appears unremarkable. There is moderate anterior mediastinal fat. There are subcentimeter mediastinal lymph nodes but no frank adenopathy by size criteria. There are areas of esophageal thickening, most notably involving the upper thoracic region of the esophagus. Esophageal thickening impresses upon the posterior aspect of the trachea. Lungs/Pleura: There are scattered areas of atelectatic change. There is no edema or consolidation. No pleural effusions are evident. There is a 4 mm nodular opacity in the anterior segment right upper lobe seen on axial slice 55 series 7. Upper Abdomen: Visualized upper abdominal structures appear unremarkable.  Musculoskeletal: There is degenerative change in the thoracic spine with diffuse idiopathic skeletal hyperostosis. There is incomplete visualization of postoperative change in the lower cervical region. No blastic or lytic bone lesions are evident. No chest wall lesions are identified. Review of the MIP images confirms the above findings. IMPRESSION: 1. No demonstrable pulmonary embolus. No thoracic aortic  aneurysm or dissection. There are occasional foci of coronary artery calcification. 2. There is enlargement of the main pulmonary outflow tract, a finding indicative of pulmonary arterial hypertension. 3. Areas of mild atelectasis. No lung edema or consolidation. 4 mm nodular opacity noted in the anterior segment right upper lobe. No follow-up needed if patient is low-risk. Non-contrast chest CT can be considered in 12 months if patient is high-risk. This recommendation follows the consensus statement: Guidelines for Management of Incidental Pulmonary Nodules Detected on CT Images: From the Fleischner Society 2017; Radiology 2017; 284:228-243. 4. Esophageal wall thickening at several sites, most notably in the upper thoracic region. This is a finding that may warrant direct visualization of the esophagus to further assess. 5. Mild soft tissue air noted in the left lower neck region, likely of postoperative etiology given recent cervical spine surgery. 6.  Diffuse idiopathic skeletal hyperostosis in the thoracic spine. Electronically Signed   By: Lowella Grip III M.D.   On: 05/26/2019 08:05   MR SHOULDER LEFT WO CONTRAST  Result Date: 06/02/2019 CLINICAL DATA:  Left shoulder pain painful range of motion. The patient fell in October 2019. Diffuse upper extremity weakness. EXAM: MRI OF THE LEFT SHOULDER WITHOUT CONTRAST TECHNIQUE: Multiplanar, multisequence MR imaging of the shoulder was performed. No intravenous contrast was administered. COMPARISON:  None. FINDINGS: Rotator cuff: Intact supraspinatus,  infraspinatus, teres minor and subscapularis tendons. Muscles: No atrophy or abnormal signal of the muscles of the rotator cuff. Biceps long head:  Properly located and intact. Acromioclavicular Joint: Minimal AC joint arthropathy. Type 2 acromion. No bursitis. Glenohumeral Joint: No joint effusion. No chondral defect. Labrum:  Intact. Bones:  No marrow abnormality, fracture or dislocation. Other: None IMPRESSION: Slight AC joint arthropathy. Otherwise, normal MRI of the left shoulder. Electronically Signed   By: Lorriane Shire M.D.   On: 06/02/2019 08:35   DG Chest Port 1 View  Result Date: 05/26/2019 CLINICAL DATA:  Shortness of breath EXAM: PORTABLE CHEST 1 VIEW COMPARISON:  05/18/2019 FINDINGS: Cardiopericardial enlargement at least partially related to mediastinal fat. There is no edema, consolidation, effusion, or pneumothorax. Degenerative endplate spurring and ACDF hardware. IMPRESSION: No active disease. Electronically Signed   By: Monte Fantasia M.D.   On: 05/26/2019 06:29   DG C-Arm 1-60 Min  Result Date: 05/23/2019 CLINICAL DATA:  ACDF C5-C7. EXAM: CERVICAL SPINE - 2-3 VIEW; DG C-ARM 1-60 MIN COMPARISON:  03/06/2019 MRI cervical spine and 12/23/2018 cervical spine radiographs FLUOROSCOPY TIME:  Fluoroscopy Time:  0 minutes 18 seconds Number of Acquired Spot Images: 3 FINDINGS: Nondiagnostic spot fluoroscopic intraoperative radiographs demonstrate postsurgical changes from ACDF C5-C7. IMPRESSION: Intraoperative fluoroscopic guidance for ACDF C5-C7. Electronically Signed   By: Ilona Sorrel M.D.   On: 05/23/2019 12:14   XR Cervical Spine 2 or 3 views  Result Date: 06/06/2019 AP and lateral radiographs with hardware and bone graft in good position and alignment. Soft tissue swelling that is less than that seen I the hospital, by almost 50%     Follow-up Information    Jessy Oto, MD. Schedule an appointment as soon as possible for a visit in 1 week(s).   Specialty: Orthopedic  Surgery Contact information: [address] [place] [address] [phone]           Discharge Plan:  discharge to home  Disposition:     Signed: Benjiman Core for [name] MD 06/20/2019, 10:34 AM

## 2019-06-21 ENCOUNTER — Other Ambulatory Visit: Payer: Self-pay

## 2019-06-21 ENCOUNTER — Ambulatory Visit: Payer: BC Managed Care – PPO | Admitting: Podiatry

## 2019-06-21 ENCOUNTER — Encounter: Payer: Self-pay | Admitting: Podiatry

## 2019-06-21 DIAGNOSIS — M7661 Achilles tendinitis, right leg: Secondary | ICD-10-CM | POA: Diagnosis not present

## 2019-06-21 DIAGNOSIS — B351 Tinea unguium: Secondary | ICD-10-CM | POA: Diagnosis not present

## 2019-06-21 DIAGNOSIS — M7662 Achilles tendinitis, left leg: Secondary | ICD-10-CM | POA: Diagnosis not present

## 2019-06-21 DIAGNOSIS — R069 Unspecified abnormalities of breathing: Secondary | ICD-10-CM | POA: Diagnosis not present

## 2019-06-21 DIAGNOSIS — J449 Chronic obstructive pulmonary disease, unspecified: Secondary | ICD-10-CM | POA: Diagnosis not present

## 2019-06-21 DIAGNOSIS — M722 Plantar fascial fibromatosis: Secondary | ICD-10-CM

## 2019-06-21 DIAGNOSIS — M79676 Pain in unspecified toe(s): Secondary | ICD-10-CM | POA: Diagnosis not present

## 2019-06-21 DIAGNOSIS — G4733 Obstructive sleep apnea (adult) (pediatric): Secondary | ICD-10-CM | POA: Diagnosis not present

## 2019-06-21 DIAGNOSIS — M7660 Achilles tendinitis, unspecified leg: Secondary | ICD-10-CM

## 2019-06-21 NOTE — Progress Notes (Signed)
He presents today with a chief complaint of painful toenails bilaterally.  He is also complaining of painful Achilles insertions and plantar fasciitis.  States he has had recent neck surgery which has resulted in some more of his worsening of his plantar fascia.  Objective: Vital signs are stable alert and oriented x3.  Pulses are palpable.  He has pain on palpation of the plantar fascia as well as the Achilles at its insertion site.  Toenails are long thick yellow dystrophic clinically mycotic it appears that he may be starting to develop some early neuropathy.  Assessment: Early neuropathy.  Plantar fasciitis bilateral insertional Achilles tendinitis bilateral painful elongated toenails bilateral.  Plan: Discussed etiology pathology conservative versus surgical therapies at this point time injected plantar fascial 10 mg of Kenalog 5 mg of Marcaine bilaterally.  Injected the Achilles area with 2 mg of dexamethasone bilaterally.  I also debrided his toenails 1 through 5 bilaterally.  Follow-up with him in 3 months.

## 2019-06-23 DIAGNOSIS — F329 Major depressive disorder, single episode, unspecified: Secondary | ICD-10-CM | POA: Diagnosis not present

## 2019-06-23 DIAGNOSIS — J449 Chronic obstructive pulmonary disease, unspecified: Secondary | ICD-10-CM | POA: Diagnosis not present

## 2019-06-23 DIAGNOSIS — K219 Gastro-esophageal reflux disease without esophagitis: Secondary | ICD-10-CM | POA: Diagnosis not present

## 2019-06-23 DIAGNOSIS — M1991 Primary osteoarthritis, unspecified site: Secondary | ICD-10-CM | POA: Diagnosis not present

## 2019-06-23 DIAGNOSIS — Z6841 Body Mass Index (BMI) 40.0 and over, adult: Secondary | ICD-10-CM | POA: Diagnosis not present

## 2019-06-23 DIAGNOSIS — Z4789 Encounter for other orthopedic aftercare: Secondary | ICD-10-CM | POA: Diagnosis not present

## 2019-06-23 DIAGNOSIS — Z9981 Dependence on supplemental oxygen: Secondary | ICD-10-CM | POA: Diagnosis not present

## 2019-06-23 DIAGNOSIS — G4733 Obstructive sleep apnea (adult) (pediatric): Secondary | ICD-10-CM | POA: Diagnosis not present

## 2019-06-23 DIAGNOSIS — N400 Enlarged prostate without lower urinary tract symptoms: Secondary | ICD-10-CM | POA: Diagnosis not present

## 2019-06-23 DIAGNOSIS — K589 Irritable bowel syndrome without diarrhea: Secondary | ICD-10-CM | POA: Diagnosis not present

## 2019-06-23 DIAGNOSIS — F419 Anxiety disorder, unspecified: Secondary | ICD-10-CM | POA: Diagnosis not present

## 2019-06-23 DIAGNOSIS — M50122 Cervical disc disorder at C5-C6 level with radiculopathy: Secondary | ICD-10-CM | POA: Diagnosis not present

## 2019-06-23 DIAGNOSIS — M4802 Spinal stenosis, cervical region: Secondary | ICD-10-CM | POA: Diagnosis not present

## 2019-06-23 DIAGNOSIS — Z981 Arthrodesis status: Secondary | ICD-10-CM | POA: Diagnosis not present

## 2019-06-23 DIAGNOSIS — E669 Obesity, unspecified: Secondary | ICD-10-CM | POA: Diagnosis not present

## 2019-06-23 DIAGNOSIS — M50123 Cervical disc disorder at C6-C7 level with radiculopathy: Secondary | ICD-10-CM | POA: Diagnosis not present

## 2019-06-24 DIAGNOSIS — Z6841 Body Mass Index (BMI) 40.0 and over, adult: Secondary | ICD-10-CM | POA: Diagnosis not present

## 2019-06-24 DIAGNOSIS — M50123 Cervical disc disorder at C6-C7 level with radiculopathy: Secondary | ICD-10-CM | POA: Diagnosis not present

## 2019-06-24 DIAGNOSIS — M50122 Cervical disc disorder at C5-C6 level with radiculopathy: Secondary | ICD-10-CM | POA: Diagnosis not present

## 2019-06-24 DIAGNOSIS — G4733 Obstructive sleep apnea (adult) (pediatric): Secondary | ICD-10-CM | POA: Diagnosis not present

## 2019-06-24 DIAGNOSIS — J449 Chronic obstructive pulmonary disease, unspecified: Secondary | ICD-10-CM | POA: Diagnosis not present

## 2019-06-24 DIAGNOSIS — Z9981 Dependence on supplemental oxygen: Secondary | ICD-10-CM | POA: Diagnosis not present

## 2019-06-24 DIAGNOSIS — K589 Irritable bowel syndrome without diarrhea: Secondary | ICD-10-CM | POA: Diagnosis not present

## 2019-06-24 DIAGNOSIS — N400 Enlarged prostate without lower urinary tract symptoms: Secondary | ICD-10-CM | POA: Diagnosis not present

## 2019-06-24 DIAGNOSIS — Z4789 Encounter for other orthopedic aftercare: Secondary | ICD-10-CM | POA: Diagnosis not present

## 2019-06-24 DIAGNOSIS — K219 Gastro-esophageal reflux disease without esophagitis: Secondary | ICD-10-CM | POA: Diagnosis not present

## 2019-06-24 DIAGNOSIS — M1991 Primary osteoarthritis, unspecified site: Secondary | ICD-10-CM | POA: Diagnosis not present

## 2019-06-24 DIAGNOSIS — F329 Major depressive disorder, single episode, unspecified: Secondary | ICD-10-CM | POA: Diagnosis not present

## 2019-06-24 DIAGNOSIS — Z981 Arthrodesis status: Secondary | ICD-10-CM | POA: Diagnosis not present

## 2019-06-24 DIAGNOSIS — F419 Anxiety disorder, unspecified: Secondary | ICD-10-CM | POA: Diagnosis not present

## 2019-06-24 DIAGNOSIS — M4802 Spinal stenosis, cervical region: Secondary | ICD-10-CM | POA: Diagnosis not present

## 2019-06-24 DIAGNOSIS — E669 Obesity, unspecified: Secondary | ICD-10-CM | POA: Diagnosis not present

## 2019-06-26 ENCOUNTER — Other Ambulatory Visit: Payer: Self-pay | Admitting: Allergy & Immunology

## 2019-06-27 ENCOUNTER — Ambulatory Visit (INDEPENDENT_AMBULATORY_CARE_PROVIDER_SITE_OTHER): Payer: BC Managed Care – PPO | Admitting: Specialist

## 2019-06-27 ENCOUNTER — Other Ambulatory Visit: Payer: Self-pay

## 2019-06-27 ENCOUNTER — Ambulatory Visit: Payer: Self-pay

## 2019-06-27 ENCOUNTER — Encounter: Payer: Self-pay | Admitting: Specialist

## 2019-06-27 VITALS — BP 126/81 | HR 84 | Ht 72.0 in | Wt 340.0 lb

## 2019-06-27 DIAGNOSIS — Z981 Arthrodesis status: Secondary | ICD-10-CM

## 2019-06-27 MED ORDER — HYDROCODONE-ACETAMINOPHEN 10-325 MG PO TABS: 1.0000 | ORAL_TABLET | Freq: Four times a day (QID) | ORAL | 0 refills | Status: DC | PRN

## 2019-06-27 NOTE — Progress Notes (Signed)
Post-Op Visit Note   Patient: Mark Harrell           Date of Birth: [birth year]           MRN: [id number] Visit Date: 06/27/2019 PCP: [name], MD   Assessment & Plan:  Chief Complaint:  Chief Complaint  Patient presents with  . Neck - Routine Post Op   59 year old white male who is 5 weeks status post C5-C7 ACDF returns.  States that he continues to have some neck swelling.  He was seen by ENT physician last week and advised that patient did not have any compromise of his airway or esophagus.  He is able to get down some solid foods.  He states that soft collar was irritating his neck at night and so he has been using the hard collar all the time.  He asked if it is okay to get his Botox injections this Thursday for migraines.  Visit Diagnoses:  1. Status post cervical spinal fusion     Plan: Patient continue wearing her collar x3 more weeks. Next appointment we will decide if he can come out of collar.  Still no aggressive activity, pushing, pulling, lifting.  We will wean him from [place] down to [place] 10/325.  Follow-Up Instructions: Return in about 3 weeks (around 07/18/2019) for with Dr [name].   Orders:  Orders Placed This Encounter  Procedures  . XR Cervical Spine 2 or 3 views   Meds ordered this encounter  Medications  . HYDROcodone-acetaminophen (NORCO) 10-325 MG tablet    Sig: Take 1 tablet by mouth every 6 (six) hours as needed for moderate pain.    Dispense:  50 tablet    Refill:  0    Must not take with with percocet.    Imaging: No results found.  PMFS History: Patient Active Problem List   Diagnosis Date Noted  . Tracheomalacia 05/26/2019  . Acquired tracheomalacia 05/26/2019  . HNP (herniated nucleus pulposus) with myelopathy, cervical 05/23/2019    Class: Chronic  . Spinal stenosis of cervical region 05/23/2019    Class: Chronic  . Status post cervical spinal fusion 05/23/2019  . Chronic respiratory failure with hypoxia ([place]) 04/12/2018    . Hymenoptera allergy 11/10/2017  . Diverticulosis 08/07/2017  . Family history of colonic polyps 08/07/2017  . Myofascial pain 08/06/2017  . Seasonal and perennial allergic rhinitis 04/23/2017  . 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 Cervicalgia 04/01/2017  . Cervical spondylosis with radiculopathy 02/09/2017  . Memory difficulty 12/05/2016  . Morbid obesity ([place]) 12/05/2016  . Migraine without aura and without status migrainosus, not intractable 10/30/2016  . Lumbar radiculopathy 05/15/2016  . Osteoarthritis of spine with radiculopathy, lumbar region 05/15/2016  . Risk for falls 05/15/2016  . Recurrent infections 03/29/2016  . Chronic nonseasonal allergic rhinitis due to pollen 03/29/2016  . Polypharmacy 01/16/2016  . Morbid obesity with BMI of 50.0-59.9, adult ([place]) 03/06/2015  . SDAT 02/05/2015  . OSA and COPD overlap syndrome ([place]) 02/05/2015  . Medication management 08/02/2014  . GERD (gastroesophageal reflux disease) 05/09/2014  . Vitamin D deficiency 08/01/2013  . Prediabetes 08/01/2013  . 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 Diverticula of colon 05/07/2011  . Hypertension 01/31/2011  . Hyperlipidemia, mixed 01/31/2011  . BPH (benign prostatic hyperplasia) 01/31/2011  . Testosterone Deficiency 01/31/2011  . IBS (irritable bowel syndrome) 01/31/2011  . Partial complex seizure disorder with intractable epilepsy ([place]) 01/31/2011  . Depression, major, recurrent, in partial remission ([place]) 01/31/2011  . Asthma-COPD overlap syndrome (  [place]) 01/31/2011   Past Medical History:  Diagnosis Date  . Anesthesia complication requiring reversal agent administration    ? from central apnea, very difficult to get off vent  . Anxiety   . Arthritis    osteo  . Asthma   . BPH (benign prostatic hyperplasia)   . Complication of anesthesia    difficulty waking , they twlight me because of my respiratory problems "  . Depression   . Dyspnea    on exertion  . Enlarged heart   . Family history  of adverse reaction to anesthesia    mother trouble waking up, and heart stopped  . GERD (gastroesophageal reflux disease)   . Headache    botox injections for headaches  . Hyperlipidemia   . Hypertension   . Hypogonadism male   . IBS (irritable bowel syndrome)   . Memory difficulties    short term memories  . Obesity   . On home oxygen therapy    on 2 liter  . OSA (obstructive sleep apnea)    not using cpap  . Pneumonia   . Pre-diabetes   . Prostatitis     Family History  Problem Relation Age of Onset  . Diabetes Paternal Uncle   . Cancer Father        lymphoma, colon  . Diabetes Maternal Grandmother   . Heart disease Maternal Grandfather   . Diabetes Maternal Grandfather   . Diabetes Paternal Grandmother   . Diabetes Paternal Grandfather   . Dementia Mother   . Prostate cancer Maternal Uncle   . Lung disease Neg Hx   . Rheumatologic disease Neg Hx     Past Surgical History:  Procedure Laterality Date  . ABDOMINAL SURGERY    . ANKLE FRACTURE SURGERY Right   . ANTERIOR CERVICAL DECOMP/DISCECTOMY FUSION N/A 05/23/2019   Procedure: ANTERIOR CERVICAL DISCECTOMY FUSION CERVICAL FIVE THROUGH CERVICAL SIX AND CERVICAL SIX THROUGH CERVICAL SEVEN;  Surgeon: Jessy Oto, MD;  Location: [facility];  Service: Orthopedics;  Laterality: N/A;  . COLONOSCOPY    . CYSTOSCOPY     [place]  . KNEE ARTHROSCOPY WITH MEDIAL MENISECTOMY Left 01/02/2017   Procedure: LEFT KNEE ARTHROSCOPY WITH PARTIAL MEDIAL MENISCECTOMY;  Surgeon: Mcarthur Rossetti, MD;  Location: [facility];  Service: Orthopedics;  Laterality: Left;  . TONSILLECTOMY    . [place] RESECTION  2007  . UVULOPALATOPHARYNGOPLASTY     Social History   Occupational History  . Occupation: Dance movement psychotherapist  Tobacco Use  . Smoking status: Former Smoker    Packs/day: 0.10    Years: 15.00    Pack years: 1.50  . Smokeless tobacco: Never Used  . Tobacco comment: significant second-hand exposure through mother  Substance  and Sexual Activity  . Alcohol use: Yes    Alcohol/week: 1.0 standard drinks    Types: 1 Glasses of wine per week    Comment: 2 x a year  . Drug use: No  . Sexual activity: Never   Exam Surgical incision is well-healed.  Does have some swelling although not extreme.  No respiratory distress.  No hoarseness.

## 2019-06-27 NOTE — Telephone Encounter (Signed)
Is refill appropriate  

## 2019-06-28 NOTE — Telephone Encounter (Signed)
Yes he is on it prophylactically for specific antibody deficiency.

## 2019-06-30 DIAGNOSIS — G43709 Chronic migraine without aura, not intractable, without status migrainosus: Secondary | ICD-10-CM | POA: Diagnosis not present

## 2019-07-06 ENCOUNTER — Encounter: Payer: Self-pay | Admitting: Internal Medicine
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
## 2019-07-06 NOTE — Patient Instructions (Signed)

## 2019-07-07 ENCOUNTER — Other Ambulatory Visit: Payer: Self-pay

## 2019-07-07 ENCOUNTER — Ambulatory Visit (INDEPENDENT_AMBULATORY_CARE_PROVIDER_SITE_OTHER): Payer: BC Managed Care – PPO | Admitting: Internal Medicine

## 2019-07-07 VITALS — BP 118/76 | HR 76 | Temp 97.4°F | Resp 18 | Ht 72.0 in | Wt 339.2 lb

## 2019-07-07 DIAGNOSIS — E785 Hyperlipidemia, unspecified: Secondary | ICD-10-CM | POA: Diagnosis not present

## 2019-07-07 DIAGNOSIS — E559 Vitamin D deficiency, unspecified: Secondary | ICD-10-CM | POA: Diagnosis not present

## 2019-07-07 DIAGNOSIS — E1169 Type 2 diabetes mellitus with other specified complication: Secondary | ICD-10-CM | POA: Diagnosis not present

## 2019-07-07 DIAGNOSIS — J449 Chronic obstructive pulmonary disease, unspecified: Secondary | ICD-10-CM

## 2019-07-07 DIAGNOSIS — Z6841 Body Mass Index (BMI) 40.0 and over, adult: Secondary | ICD-10-CM

## 2019-07-07 DIAGNOSIS — E1122 Type 2 diabetes mellitus with diabetic chronic kidney disease: Secondary | ICD-10-CM

## 2019-07-07 DIAGNOSIS — I10 Essential (primary) hypertension: Secondary | ICD-10-CM

## 2019-07-07 DIAGNOSIS — N181 Chronic kidney disease, stage 1: Secondary | ICD-10-CM

## 2019-07-07 DIAGNOSIS — Z79899 Other long term (current) drug therapy: Secondary | ICD-10-CM | POA: Diagnosis not present

## 2019-07-08 LAB — COMPLETE METABOLIC PANEL WITH GFR
AG Ratio: 1.8 (calc) (ref 1.0–2.5)
ALT: 13 U/L (ref 9–46)
AST: 13 U/L (ref 10–35)
Albumin: 4 g/dL (ref 3.6–5.1)
Alkaline phosphatase (APISO): 85 U/L (ref 35–144)
BUN: 17 mg/dL (ref 7–25)
CO2: 33 mmol/L — ABNORMAL HIGH (ref 20–32)
Calcium: 10.1 mg/dL (ref 8.6–10.3)
Chloride: 101 mmol/L (ref 98–110)
Creat: 0.86 mg/dL (ref 0.70–1.33)
GFR, Est African American: 111 mL/min/{1.73_m2} (ref >=60)
GFR, Est Non African American: 96 mL/min/{1.73_m2} (ref >=60)
Globulin: 2.2 g/dL (calc) (ref 1.9–3.7)
Glucose, Bld: 90 mg/dL (ref 65–99)
Potassium: 4.4 mmol/L (ref 3.5–5.3)
Sodium: 141 mmol/L (ref 135–146)
Total Bilirubin: 1.2 mg/dL (ref 0.2–1.2)
Total Protein: 6.2 g/dL (ref 6.1–8.1)

## 2019-07-08 LAB — LIPID PANEL
Cholesterol: 163 mg/dL (ref <200)
HDL: 38 mg/dL — ABNORMAL LOW (ref >=40)
LDL Cholesterol (Calc): 93 mg/dL (calc)
Non-HDL Cholesterol (Calc): 125 mg/dL (calc) (ref <130)
Total CHOL/HDL Ratio: 4.3 (calc) (ref <5.0)
Triglycerides: 234 mg/dL — ABNORMAL HIGH (ref <150)

## 2019-07-08 LAB — HEMOGLOBIN A1C
Hgb A1c MFr Bld: 5.4 % of total Hgb (ref <5.7)
Mean Plasma Glucose: 108 (calc)
eAG (mmol/L): 6 (calc)

## 2019-07-08 LAB — CBC WITH DIFFERENTIAL/PLATELET
Absolute Monocytes: 968 cells/uL — ABNORMAL HIGH (ref 200–950)
Basophils Absolute: 72 cells/uL (ref 0–200)
Basophils Relative: 0.7 %
Eosinophils Absolute: 103 cells/uL (ref 15–500)
Eosinophils Relative: 1 %
HCT: 45.7 % (ref 38.5–50.0)
Hemoglobin: 15.3 g/dL (ref 13.2–17.1)
Lymphs Abs: 2524 cells/uL (ref 850–3900)
MCH: 29.4 pg (ref 27.0–33.0)
MCHC: 33.5 g/dL (ref 32.0–36.0)
MCV: 87.7 fL (ref 80.0–100.0)
MPV: 10.7 fL (ref 7.5–12.5)
Monocytes Relative: 9.4 %
Neutro Abs: 6633 cells/uL (ref 1500–7800)
Neutrophils Relative %: 64.4 %
Platelets: 272 10*3/uL (ref 140–400)
RBC: 5.21 10*6/uL (ref 4.20–5.80)
RDW: 12.5 % (ref 11.0–15.0)
Total Lymphocyte: 24.5 %
WBC: 10.3 10*3/uL (ref 3.8–10.8)

## 2019-07-08 LAB — INSULIN, RANDOM: Insulin: 17.1 u[IU]/mL

## 2019-07-08 LAB — VITAMIN D 25 HYDROXY (VIT D DEFICIENCY, FRACTURES): Vit D, 25-Hydroxy: 95 ng/mL (ref 30–100)

## 2019-07-08 LAB — MAGNESIUM: Magnesium: 2.1 mg/dL (ref 1.5–2.5)

## 2019-07-08 LAB — TSH: TSH: 1.38 mIU/L (ref 0.40–4.50)

## 2019-07-18 DIAGNOSIS — J9611 Chronic respiratory failure with hypoxia: Secondary | ICD-10-CM | POA: Diagnosis not present

## 2019-07-18 DIAGNOSIS — J449 Chronic obstructive pulmonary disease, unspecified: Secondary | ICD-10-CM | POA: Diagnosis not present

## 2019-07-22 DIAGNOSIS — G4733 Obstructive sleep apnea (adult) (pediatric): Secondary | ICD-10-CM | POA: Diagnosis not present

## 2019-07-22 DIAGNOSIS — J449 Chronic obstructive pulmonary disease, unspecified: Secondary | ICD-10-CM | POA: Diagnosis not present

## 2019-07-22 DIAGNOSIS — R069 Unspecified abnormalities of breathing: Secondary | ICD-10-CM | POA: Diagnosis not present

## 2019-07-25 ENCOUNTER — Ambulatory Visit (INDEPENDENT_AMBULATORY_CARE_PROVIDER_SITE_OTHER): Payer: BC Managed Care – PPO

## 2019-07-25 ENCOUNTER — Encounter: Payer: Self-pay | Admitting: Specialist

## 2019-07-25 ENCOUNTER — Ambulatory Visit: Payer: BC Managed Care – PPO | Admitting: Specialist

## 2019-07-25 ENCOUNTER — Other Ambulatory Visit: Payer: Self-pay

## 2019-07-25 VITALS — BP 114/73 | HR 75 | Ht 72.0 in | Wt 339.0 lb

## 2019-07-25 DIAGNOSIS — M4712 Other spondylosis with myelopathy, cervical region: Secondary | ICD-10-CM

## 2019-07-25 DIAGNOSIS — Z981 Arthrodesis status: Secondary | ICD-10-CM | POA: Diagnosis not present

## 2019-07-25 DIAGNOSIS — R29898 Other symptoms and signs involving the musculoskeletal system: Secondary | ICD-10-CM

## 2019-07-25 NOTE — Progress Notes (Addendum)
Post-Op Visit Note   Patient: Mark Harrell           Date of Birth: 04-03-1961           MRN: [id number] Visit Date: 07/25/2019 PCP: [name], MD   Assessment & Plan: 9 weeks post ACDF C5-6 and C6-7.  Chief Complaint:  Chief Complaint  Patient presents with  . Neck - Pain   Visit Diagnoses:  1. Status post cervical spinal fusion   2. Weakness of both arms   59 year old right handed male with history of ACDF C5-7 for cervical stenosis and he reports still having difficulty with Neck with bilateral shoulder pain. He has seen [name] last week and he is week in his arms and hands, weak and clumbsy. Reports not having been able to use the arms for over a year. He is not driving and notices difficulty using the right hand to  Write. The left hand is the worse and his signature doesn't look like his signature. There is right shoulder pain and weakness.  He is having constipation and has IBS and reports that this is unusual for him.   Incision left neck is healed. No noticeable swelling left anterior neck. Shaking with testing of all major motor groups both sides. There is positive right Hoffman's sign. There is mild hyperreflexia bilaterally. Weakness that waxes and wanes with testing of finger flexors and finger abduction.   Plan:Avoid overhead lifting and overhead use of the arms. Do not lift greater than 5 lbs. Adjust head rest in vehicle to prevent hyperextension if rear ended. Take extra precautions to avoid falling, including use of a cane if you feel weak. HHN for OT and PT for UE strengthening and ROM Recommend that we have you seen by out patient Rehab center at [facility] on [facility] for evaluation and continued intensive PT/OT The fusion site has not healed at 9 weeks post surgery, this is showing a slowness in the healing and partly relates to the ankylosing  Spondylitis and to perioperative steroids. Continue with the use of vitamin d and calcium supplements. Need to  continue the use of The hard cervical collar. If healing does not occur then we would need to consider a posterior cervical fusion. The surgery you had was performed with intraoperative neuromonitoring, this did not show any sign of intraoperative change.  It is likely the arm weakness and clumbsiness and loss of fine motor skills relates to spinal cord changes that may be permanent or  May take time to heal. Referral to Rehabilitation Medicine at [facility] outpatient PT on [facility].   Follow-Up Instructions: Return in about 4 weeks (around 08/22/2019).   Orders:  Orders Placed This Encounter  Procedures  . XR Cervical Spine 2 or 3 views  . Ambulatory referral to Physical Medicine Rehab  . Ambulatory referral to Home Health   No orders of the defined types were placed in this encounter.   Imaging: XR Cervical Spine 2 or 3 views  Result Date: 07/25/2019 AP and lateral flexion and extension radiographs of the cervical spine shows the plate and screws without sign of loosening, But with flexion and extension there is still motion over the posterior elements measuring nearly 6 mm.    PMFS History: Patient Active Problem List   Diagnosis Date Noted  . HNP (herniated nucleus pulposus) with myelopathy, cervical 05/23/2019    Priority: High    Class: Chronic  . Spinal stenosis of cervical region 05/23/2019  Priority: High    Class: Chronic  . Tracheomalacia 05/26/2019  . Acquired tracheomalacia 05/26/2019  . Status post cervical spinal fusion 05/23/2019  . Chronic respiratory failure with hypoxia ([place]) 04/12/2018  . Hymenoptera allergy 11/10/2017  . Diverticulosis 08/07/2017  . Family history of colonic polyps 08/07/2017  . Myofascial pain 08/06/2017  . Seasonal and perennial allergic rhinitis 04/23/2017  . 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 Cervicalgia 04/01/2017  . Cervical spondylosis with radiculopathy 02/09/2017  . Memory difficulty 12/05/2016  . Morbid obesity ([place]) 12/05/2016   . Migraine without aura and without status migrainosus, not intractable 10/30/2016  . Lumbar radiculopathy 05/15/2016  . Osteoarthritis of spine with radiculopathy, lumbar region 05/15/2016  . Risk for falls 05/15/2016  . Recurrent infections 03/29/2016  . Chronic nonseasonal allergic rhinitis due to pollen 03/29/2016  . Polypharmacy 01/16/2016  . Morbid obesity with BMI of 50.0-59.9, adult ([place]) 03/06/2015  . SDAT 02/05/2015  . OSA and COPD overlap syndrome ([place]) 02/05/2015  . Medication management 08/02/2014  . GERD (gastroesophageal reflux disease) 05/09/2014  . Vitamin D deficiency 08/01/2013  . Prediabetes 08/01/2013  . 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 Diverticula of colon 05/07/2011  . Hypertension 01/31/2011  . Hyperlipidemia, mixed 01/31/2011  . BPH (benign prostatic hyperplasia) 01/31/2011  . Testosterone Deficiency 01/31/2011  . IBS (irritable bowel syndrome) 01/31/2011  . Partial complex seizure disorder with intractable epilepsy ([place]) 01/31/2011  . Depression, major, recurrent, in partial remission ([place]) 01/31/2011  . Asthma-COPD overlap syndrome ([place]) 01/31/2011   Past Medical History:  Diagnosis Date  . Anesthesia complication requiring reversal agent administration    ? from central apnea, very difficult to get off vent  . Anxiety   . Arthritis    osteo  . Asthma   . BPH (benign prostatic hyperplasia)   . Complication of anesthesia    difficulty waking , they twlight me because of my respiratory problems "  . Depression   . Dyspnea    on exertion  . Enlarged heart   . Family history of adverse reaction to anesthesia    mother trouble waking up, and heart stopped  . GERD (gastroesophageal reflux disease)   . Headache    botox injections for headaches  . Hyperlipidemia   . Hypertension   . Hypogonadism male   . IBS (irritable bowel syndrome)   . Memory difficulties    short term memories  . Obesity   . On home oxygen therapy    on 2 liter  . OSA  (obstructive sleep apnea)    not using cpap  . Pneumonia   . Pre-diabetes   . Prostatitis     Family History  Problem Relation Age of Onset  . Diabetes Paternal Uncle   . Cancer Father        lymphoma, colon  . Diabetes Maternal Grandmother   . Heart disease Maternal Grandfather   . Diabetes Maternal Grandfather   . Diabetes Paternal Grandmother   . Diabetes Paternal Grandfather   . Dementia Mother   . Prostate cancer Maternal Uncle   . Lung disease Neg Hx   . Rheumatologic disease Neg Hx     Past Surgical History:  Procedure Laterality Date  . ABDOMINAL SURGERY    . ANKLE FRACTURE SURGERY Right   . ANTERIOR CERVICAL DECOMP/DISCECTOMY FUSION N/A 05/23/2019   Procedure: ANTERIOR CERVICAL DISCECTOMY FUSION CERVICAL FIVE THROUGH CERVICAL SIX AND CERVICAL SIX THROUGH CERVICAL SEVEN;  Surgeon: Jessy Oto, MD;  Location: [facility];  Service:  Orthopedics;  Laterality: N/A;  . COLONOSCOPY    . CYSTOSCOPY     [place]  . KNEE ARTHROSCOPY WITH MEDIAL MENISECTOMY Left 01/02/2017   Procedure: LEFT KNEE ARTHROSCOPY WITH PARTIAL MEDIAL MENISCECTOMY;  Surgeon: Mcarthur Rossetti, MD;  Location: [facility];  Service: Orthopedics;  Laterality: Left;  . TONSILLECTOMY    . [place] RESECTION  2007  . UVULOPALATOPHARYNGOPLASTY     Social History   Occupational History  . Occupation: Dance movement psychotherapist  Tobacco Use  . Smoking status: Former Smoker    Packs/day: 0.10    Years: 15.00    Pack years: 1.50  . Smokeless tobacco: Never Used  . Tobacco comment: significant second-hand exposure through mother  Substance and Sexual Activity  . Alcohol use: Yes    Alcohol/week: 1.0 standard drinks    Types: 1 Glasses of wine per week    Comment: 2 x a year  . Drug use: No  . Sexual activity: Never

## 2019-07-25 NOTE — Addendum Note (Signed)
Addended by: [name] on: 07/25/2019 06:30 PM   Modules accepted: Orders

## 2019-07-25 NOTE — Patient Instructions (Addendum)
Avoid overhead lifting and overhead use of the arms. Do not lift greater than 5 lbs. Adjust head rest in vehicle to prevent hyperextension if rear ended. Take extra precautions to avoid falling, including use of a cane if you feel weak. The fusion site has not healed at 9 weeks post surgery, this is showing a slowness in the healing and partly relates to the ankylosing  Spondylitis and to perioperative steroids. Continue with the use of vitamin d and calcium supplements. Need to continue the use of The hard cervical collar. If healing does not occur then we would need to consider a posterior cervical fusion. The surgery you had was performed with intraoperative neuromonitoring, this did not show any sign of intraoperative change.  It is likely the arm weakness and clumbsiness and loss of fine motor skills relates to spinal cord changes that may be permanent or  May take time to heal. Referral to Rehabilitation Medicine at [facility] outpatient PT on [facility].

## 2019-08-03 ENCOUNTER — Telehealth: Payer: Self-pay | Admitting: Specialist

## 2019-08-03 NOTE — Telephone Encounter (Signed)
Maggie from [facility] called.   They have not been able to get in touch with the patient to get him started with PT. They need a call back to discuss a start time as well.  Call back: [phone]

## 2019-08-05 NOTE — Telephone Encounter (Signed)
I called and spoke with Burman Nieves, she states that she has tried several times to reach out to the patient, she is trying to get him started on 08/08/2019.  I called the patient and lmom, advised that Dr. [name] saw some issues at his last visit that [facility] can come out and help him with.  I advised him to call them to schedule an appt for them to come out and treat him.  And advised that if he has questions for me that he can call me back.

## 2019-08-15 DIAGNOSIS — J449 Chronic obstructive pulmonary disease, unspecified: Secondary | ICD-10-CM | POA: Diagnosis not present

## 2019-08-15 DIAGNOSIS — J9611 Chronic respiratory failure with hypoxia: Secondary | ICD-10-CM | POA: Diagnosis not present

## 2019-08-19 DIAGNOSIS — G4733 Obstructive sleep apnea (adult) (pediatric): Secondary | ICD-10-CM | POA: Diagnosis not present

## 2019-08-19 DIAGNOSIS — R069 Unspecified abnormalities of breathing: Secondary | ICD-10-CM | POA: Diagnosis not present

## 2019-08-19 DIAGNOSIS — J449 Chronic obstructive pulmonary disease, unspecified: Secondary | ICD-10-CM | POA: Diagnosis not present

## 2019-08-22 ENCOUNTER — Ambulatory Visit: Payer: BC Managed Care – PPO | Admitting: Specialist

## 2019-08-22 ENCOUNTER — Telehealth: Payer: Self-pay | Admitting: Specialist

## 2019-08-22 ENCOUNTER — Ambulatory Visit: Payer: Self-pay

## 2019-08-22 ENCOUNTER — Encounter: Payer: Self-pay | Admitting: Specialist

## 2019-08-22 ENCOUNTER — Other Ambulatory Visit: Payer: Self-pay

## 2019-08-22 VITALS — BP 114/74 | HR 90 | Ht 72.0 in | Wt 340.0 lb

## 2019-08-22 DIAGNOSIS — Z981 Arthrodesis status: Secondary | ICD-10-CM | POA: Diagnosis not present

## 2019-08-22 DIAGNOSIS — M5126 Other intervertebral disc displacement, lumbar region: Secondary | ICD-10-CM | POA: Diagnosis not present

## 2019-08-22 DIAGNOSIS — M5136 Other intervertebral disc degeneration, lumbar region: Secondary | ICD-10-CM

## 2019-08-22 MED ORDER — HYDROCODONE-ACETAMINOPHEN 7.5-325 MG PO TABS: 1.0000 | ORAL_TABLET | Freq: Four times a day (QID) | ORAL | 0 refills | Status: DC | PRN

## 2019-08-22 NOTE — Progress Notes (Addendum)
Office Visit Note   Patient: Mark Harrell           Date of Birth: 03/12/1961           MRN: [id number] Visit Date: 08/22/2019              Requested by: [name], [address] [place],  [place] [id number] PCP: [name], MD   Assessment & Plan: Visit Diagnoses:  1. Status post cervical spinal fusion   2. DDD (degenerative disc disease), lumbar   3. Herniation of lumbar intervertebral disc   The disc protrusion seen on your MRI from 02/2020 at the L5-S1 may be symptomatic and may be chronic.As the protrusion does not Appear to be showing signs of causing weakness or reflex changes management includes therapy, walking program and ESI. The  ESI is helpful in that it can be diagnostic as well as therapeutic in treating an irritated nerve root. Your pain  Pattern however can also Be due to mechanical disc pain due to painful disc wear without nerve irritation or pure arthritis pain. The ESI is helpful in determining the Cause as surgery if contemplated is directed at nerve decompression alone for nerve pain due to nerve compression and fusion for Mechanical disc or joint pain. Joint pain alone is some times amendable to a treatment call radiofrequency ablation that can numb the  Arthritic joints up for up to 2 years and alleviate pain and need for a fusion type surgery that is less predictable in terms of providing  Pain relief in patients have surgery for pure arthritis and discogenic pain.  Radiographs of the cervical spine shows less movement across the C5-7 and there is C6-7 subsidence of the graft into the inferior Endplate of C6.    Plan: Avoid overhead lifting and overhead use of the arms. Do not lift greater than 5 lbs. Adjust head rest in vehicle to prevent hyperextension if rear ended. Take extra precautions to avoid falling. Avoid frequent bending and stooping  No lifting greater than 10 lbs. May use ice or moist heat for pain. Weight loss  is of benefit. Best medication for lumbar disc disease is arthritis medications like motrin, celebrex and naprosyn. These medications cause you GI  Discomfort and will probably need to be avoided.  Exercise is important to improve your indurance and does allow people to function better inspite of back pain.    Follow-Up Instructions: Return in about 6 weeks (around 10/03/2019).   Orders:  Orders Placed This Encounter  Procedures  . XR Cervical Spine 2 or 3 views   No orders of the defined types were placed in this encounter.     Procedures: No procedures performed   Clinical Data: Findings:  CLINICAL DATA:  59 year old male status post fall in 2019. Low back pain.  EXAM: MRI LUMBAR SPINE WITHOUT CONTRAST  TECHNIQUE: Multiplanar, multisequence MR imaging of the lumbar spine was performed. No intravenous contrast was administered.  COMPARISON:  Lumbar MRI 04/11/2018. CT lumbar spine 03/26/2018.  FINDINGS: Segmentation: Normal as seen by CT, the same numbering system used on the prior MRI.  Alignment:  Stable lumbar lordosis.  Vertebrae: Minimal degenerative appearing marrow edema at the anterior L5-S1 endplates. Normal background bone marrow signal. No other acute osseous abnormality identified. Intact visible sacrum and SI joints.  Conus medullaris and cauda equina: Conus extends to the T12 level. No lower spinal cord or conus signal abnormality.  Paraspinal and other soft tissues: Partially visible  sigmoid diverticulosis. Otherwise negative Visualized abdominal viscera and paraspinal soft tissues.  Disc levels:  T11-T12: Mild disc bulging appears stable.  T12-L1:  Negative.  L1-L2:  Anterior disc bulging and endplate spurring. No stenosis.  L2-L3: Borderline to mild spinal stenosis mostly related to posterior element hypertrophy and mild epidural lipomatosis, stable.  L3-L4: Far lateral disc bulging and mild posterior  element hypertrophy. Borderline to mild right L3 foraminal stenosis is stable.  L4-L5: Far lateral disc bulging and mild to moderate posterior element hypertrophy. No stenosis.  L5-S1: Chronic circumferential disc bulging with superimposed small central disc protrusion (series 6, image 38). Mild posterior element hypertrophy. Mild involvement of the lateral recesses (S1 nerve levels) without spinal stenosis. Stable borderline to mild L5 foraminal stenosis.  IMPRESSION: 1. Stable MRI appearance of the lumbar spine since 2019. 2. L5-S1 disc bulge and small central disc protrusion resulting in up to mild lateral recess and foraminal stenosis. 3. Far lateral disc bulging elsewhere with superimposed posterior element hypertrophy. Up to mild spinal stenosis at L2-L3 and right foraminal stenosis at L3-L4.   Electronically Signed   By: Genevie Ann M.D.   On: 03/07/2019 01:41    Subjective: Chief Complaint  Patient presents with  . Neck - Follow-up    13wk s/p ACDF C5-6 and C6-7    13 weeks post cervical fusion C5 to C7, still has clumbsiness in his hands and has numbness in his hands. He used to be able to play  Piano and guitar and now unable to do chords and to position his hands the way he should. He has low back pain with bending and stooping and trouble with standing over to wash dishes or to lean over to do laundry. He is still sleeping in his chair and he is experiencing some strangeness without hard collar. Had several episodes of severe low back pain and has had to lie on the floor and had to decrease His pain. He reports loss of l ability to move and use his legs and was taken by EMS to the hospital. Has had chiropractic treatment and PT with [facility] [address] and [facility], breakthrough PT and PT in [place]. The low back pain is variable, 3-4 in chair and Sedentary, but with getting up and even brushing his teeth it is much greater, during doing laundry it goes  to an 8 or nine. He avoids yardwork, athletics and bending stooping lifting and riding in a car is painful. Washing dishes stooping increases the pain, bending to put dishes in the dish washer is painful. Most of the pain is right leg and he does get pain in the left leg. Transverse in the lower back and feels Like it would snap. The first time it happened he thinks it may have been with the fall. Had CHI and weakness on the left side and a tendency to fall to the left.    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 Negative for appetite change, chills, diaphoresis, fatigue and fever.  HENT: Negative.  Negative for congestion, dental problem, drooling, ear discharge, ear pain, facial swelling, hearing loss, mouth sores, nosebleeds, postnasal drip, rhinorrhea, sinus pressure, sinus pain, sneezing, sore throat, tinnitus, trouble swallowing and voice change.   Eyes: Negative.   Respiratory: Negative.   Cardiovascular: Negative.   Gastrointestinal: Negative.   Endocrine: Negative.   Musculoskeletal: Positive for back pain and gait problem.  Skin:  Negative.   Allergic/Immunologic: Negative.   Hematological: Negative.  Negative for adenopathy. Does not bruise/bleed easily.  Psychiatric/Behavioral: Negative.      Objective: Vital Signs: BP 114/74   Pulse 90   Ht 6' (1.829 m)   Wt (!) 340 lb (154.2 kg)   BMI 46.11 kg/m   Physical Exam Constitutional:      Appearance: He is well-developed.  HENT:     Head: Normocephalic and atraumatic.  Eyes:     Pupils: Pupils are equal, round, and reactive to light.  Pulmonary:     Effort: Pulmonary effort is normal.     Breath sounds: Normal breath sounds.  Abdominal:     General: Bowel sounds are normal.     Palpations: Abdomen is soft.  Musculoskeletal:     Cervical back: Normal range of motion and neck supple.     Lumbar back: Positive right  straight leg raise test and positive left straight leg raise test.  Skin:    General: Skin is warm and dry.  Neurological:     Mental Status: He is alert and oriented to person, place, and time.  Psychiatric:        Behavior: Behavior normal.        Thought Content: Thought content normal.        Judgment: Judgment normal.     Back Exam   Muscle Strength  Right Quadriceps:  5/5  Left Quadriceps:  5/5  Right Hamstrings:  5/5  Left Hamstrings:  5/5   Tests  Straight leg raise right: positive Straight leg raise left: positive  Reflexes  Patellar: 2/4 Achilles: 2/4 Babinski's sign: normal   Other  Toe walk: abnormal Heel walk: abnormal Sensation: decreased Erythema: no back redness Scars: absent      Specialty Comments:  No specialty comments available.  Imaging: XR Cervical Spine 2 or 3 views  Result Date: 08/22/2019 AP and lateral flexion and extension radiographs cervical spine with about 2.4 mm of motion across similar points over the superior lamina C5 to C7.     PMFS History: Patient Active Problem List   Diagnosis Date Noted  . HNP (herniated nucleus pulposus) with myelopathy, cervical 05/23/2019    Priority: High    Class: Chronic  . Spinal stenosis of cervical region 05/23/2019    Priority: High    Class: Chronic  . Tracheomalacia 05/26/2019  . Acquired tracheomalacia 05/26/2019  . Status post cervical spinal fusion 05/23/2019  . Chronic respiratory failure with hypoxia ([place]) 04/12/2018  . Hymenoptera allergy 11/10/2017  . Diverticulosis 08/07/2017  . Family history of colonic polyps 08/07/2017  . Myofascial pain 08/06/2017  . Seasonal and perennial allergic rhinitis 04/23/2017  . 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 Cervicalgia 04/01/2017  . Cervical spondylosis with radiculopathy 02/09/2017  . Memory difficulty 12/05/2016  . Morbid obesity ([place]) 12/05/2016  . Migraine without aura and without status migrainosus, not intractable 10/30/2016   . Lumbar radiculopathy 05/15/2016  . Osteoarthritis of spine with radiculopathy, lumbar region 05/15/2016  . Risk for falls 05/15/2016  . Recurrent infections 03/29/2016  . Chronic nonseasonal allergic rhinitis due to pollen 03/29/2016  . Polypharmacy 01/16/2016  . Morbid obesity with BMI of 50.0-59.9, adult ([place]) 03/06/2015  . SDAT 02/05/2015  . OSA and COPD overlap syndrome ([place]) 02/05/2015  . Medication management 08/02/2014  . GERD (gastroesophageal reflux disease) 05/09/2014  . Vitamin D deficiency 08/01/2013  . Prediabetes 08/01/2013  . 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 Diverticula of colon 05/07/2011  .  Hypertension 01/31/2011  . Hyperlipidemia, mixed 01/31/2011  . BPH (benign prostatic hyperplasia) 01/31/2011  . Testosterone Deficiency 01/31/2011  . IBS (irritable bowel syndrome) 01/31/2011  . Partial complex seizure disorder with intractable epilepsy ([place]) 01/31/2011  . Depression, major, recurrent, in partial remission ([place]) 01/31/2011  . Asthma-COPD overlap syndrome ([place]) 01/31/2011   Past Medical History:  Diagnosis Date  . Anesthesia complication requiring reversal agent administration    ? from central apnea, very difficult to get off vent  . Anxiety   . Arthritis    osteo  . Asthma   . BPH (benign prostatic hyperplasia)   . Complication of anesthesia    difficulty waking , they twlight me because of my respiratory problems "  . Depression   . Dyspnea    on exertion  . Enlarged heart   . Family history of adverse reaction to anesthesia    mother trouble waking up, and heart stopped  . GERD (gastroesophageal reflux disease)   . Headache    botox injections for headaches  . Hyperlipidemia   . Hypertension   . Hypogonadism male   . IBS (irritable bowel syndrome)   . Memory difficulties    short term memories  . Obesity   . On home oxygen therapy    on 2 liter  . OSA (obstructive sleep apnea)    not using cpap  . Pneumonia   . Pre-diabetes   .  Prostatitis     Family History  Problem Relation Age of Onset  . Diabetes Paternal Uncle   . Cancer Father        lymphoma, colon  . Diabetes Maternal Grandmother   . Heart disease Maternal Grandfather   . Diabetes Maternal Grandfather   . Diabetes Paternal Grandmother   . Diabetes Paternal Grandfather   . Dementia Mother   . Prostate cancer Maternal Uncle   . Lung disease Neg Hx   . Rheumatologic disease Neg Hx     Past Surgical History:  Procedure Laterality Date  . ABDOMINAL SURGERY    . ANKLE FRACTURE SURGERY Right   . ANTERIOR CERVICAL DECOMP/DISCECTOMY FUSION N/A 05/23/2019   Procedure: ANTERIOR CERVICAL DISCECTOMY FUSION CERVICAL FIVE THROUGH CERVICAL SIX AND CERVICAL SIX THROUGH CERVICAL SEVEN;  Surgeon: Jessy Oto, MD;  Location: [facility];  Service: Orthopedics;  Laterality: N/A;  . COLONOSCOPY    . CYSTOSCOPY     [place]  . KNEE ARTHROSCOPY WITH MEDIAL MENISECTOMY Left 01/02/2017   Procedure: LEFT KNEE ARTHROSCOPY WITH PARTIAL MEDIAL MENISCECTOMY;  Surgeon: Mcarthur Rossetti, MD;  Location: [facility];  Service: Orthopedics;  Laterality: Left;  . TONSILLECTOMY    . [place] RESECTION  2007  . UVULOPALATOPHARYNGOPLASTY     Social History   Occupational History  . Occupation: Dance movement psychotherapist  Tobacco Use  . Smoking status: Former Smoker    Packs/day: 0.10    Years: 15.00    Pack years: 1.50  . Smokeless tobacco: Never Used  . Tobacco comment: significant second-hand exposure through mother  Substance and Sexual Activity  . Alcohol use: Yes    Alcohol/week: 1.0 standard drinks    Types: 1 Glasses of wine per week    Comment: 2 x a year  . Drug use: No  . Sexual activity: Never

## 2019-08-22 NOTE — Telephone Encounter (Signed)
Patient checked out and was unsure of Dr. [name] calling in prescriptions for pain. Please give patient a called about medications and if he will send to pharmacy. Patient phone number is [phone].

## 2019-08-22 NOTE — Addendum Note (Signed)
Addended by: [name] on: 08/22/2019 07:51 PM   Modules accepted: Orders

## 2019-08-22 NOTE — Patient Instructions (Addendum)
Avoid overhead lifting and overhead use of the arms. Do not lift greater than 5 lbs. Adjust head rest in vehicle to prevent hyperextension if rear ended. Take extra precautions to avoid falling, including use of a cane if you feel weak. Okay  To change to a soft cervical collar and may go without at night when sleeping, use pillows to level the head and neck.  Avoid frequent bending and stooping  No lifting greater than 10 lbs. May use ice or moist heat for pain. Weight loss is of benefit. Best medication for lumbar disc disease is arthritis medications like motrin, celebrex and naprosyn which you unfortunately have side effects with taking.  Exercise is important to improve your indurance and does allow people to function better inspite of back pain. The disc protrusion seen on your MRI from 02/2020 at the L5-S1 may be symptomatic and may be chronic.As the protrusion does not Appear to be showing signs of causing weakness or reflex changes management includes therapy, walking program and ESI. The  ESI is helpful in that it can be diagnostic as well as therapeutic in treating an irritated nerve root. Your pain  Pattern however can also Be due to mechanical disc pain due to painful disc wear without nerve irritation or pure arthritis pain. The ESI is helpful in determining the Cause as surgery if contemplated is directed at nerve decompression alone for nerve pain due to nerve compression and fusion for Mechanical disc or joint pain. Joint pain alone is some times amendable to a treatment call radiofrequency ablation that can numb the  Arthritic joints up for up to 2 years and alleviate pain and need for a fusion type surgery that is less predictable in terms of providing  Pain relief in patients have surgery for pure arthritis and discogenic pain.

## 2019-08-22 NOTE — Telephone Encounter (Signed)
Patient checked out and was unsure of Dr. [name] calling in prescriptions for pain. Please give patient a called about medications and if he will send to pharmacy

## 2019-08-23 NOTE — Telephone Encounter (Signed)
I tried to call him to advise him of this but his VM was full.

## 2019-08-23 NOTE — Telephone Encounter (Signed)
Rx sent to this patient's pharmacy, hydrocodone, I will not fill oxycodone for pain related to cervical spine 13 weeks post op or lumbar chronic condition that we are working up.

## 2019-08-23 NOTE — Telephone Encounter (Signed)
I called and advised we sent in Hydrocodone

## 2019-09-01 ENCOUNTER — Other Ambulatory Visit: Payer: Self-pay

## 2019-09-01 ENCOUNTER — Encounter: Payer: Self-pay | Admitting: Allergy & Immunology

## 2019-09-01 ENCOUNTER — Ambulatory Visit: Payer: BC Managed Care – PPO | Admitting: Allergy & Immunology

## 2019-09-01 VITALS — BP 130/80 | HR 83 | Temp 98.4°F | Ht 72.0 in | Wt 345.8 lb

## 2019-09-01 DIAGNOSIS — J3089 Other allergic rhinitis: Secondary | ICD-10-CM

## 2019-09-01 DIAGNOSIS — J302 Other seasonal allergic rhinitis: Secondary | ICD-10-CM

## 2019-09-01 DIAGNOSIS — J449 Chronic obstructive pulmonary disease, unspecified: Secondary | ICD-10-CM

## 2019-09-01 DIAGNOSIS — J4540 Moderate persistent asthma, uncomplicated: Secondary | ICD-10-CM | POA: Diagnosis not present

## 2019-09-01 DIAGNOSIS — B999 Unspecified infectious disease: Secondary | ICD-10-CM

## 2019-09-01 DIAGNOSIS — L2084 Intrinsic (allergic) eczema: Secondary | ICD-10-CM

## 2019-09-01 MED ORDER — CIPROFLOXACIN-DEXAMETHASONE 0.3-0.1 % OT SUSP: 4.0000 [drp] | Freq: Two times a day (BID) | OTIC | 1 refills | Status: AC

## 2019-09-01 MED ORDER — AZELASTINE HCL 0.1 % NA SOLN: NASAL | 3 refills | Status: DC

## 2019-09-01 MED ORDER — TRELEGY ELLIPTA 100-62.5-25 MCG/INH IN AEPB: 1.0000 | INHALATION_SPRAY | Freq: Every day | RESPIRATORY_TRACT | 2 refills | Status: DC

## 2019-09-01 MED ORDER — FLUTICASONE PROPIONATE 50 MCG/ACT NA SUSP: NASAL | 3 refills | Status: DC

## 2019-09-01 NOTE — Patient Instructions (Addendum)
1. 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 - I think we can hold off on the immunoglobulin replacement for now since you are doing so well.   2. Asthma-COPD overlap syndrome - Lung testing looked stable today. - It seems that we are on a good combination of medications. - Daily controller medication(s): Trelegy 100/62.5/25 one puff once daily - Prior to physical activity: ProAir 2 puffs 10-15 minutes before physical activity. - Rescue medications: ProAir 4 puffs every 4-6 hours as needed or DuoNeb nebulizer one vial every 4-6 hours as needed - Asthma control goals:  * Full participation in all desired activities (may need albuterol before activity) * Albuterol use two time or less a week on average (not counting use with activity) * Cough interfering with sleep two time or less a month * Oral steroids no more than once a year * No hospitalizations   3. Chronic allergic rhinitis (sweet vernal grass, box elder, cat, weeds, ragweed, molds, cockroach, dust mite) - Continue with aszelastine/fluticasone 2 sprays per nostril up to twice daily. - Continue with saline mist 1-2 times daily.  - Continue with cetirizine 10mg  daily as needed for breakthrough symptoms.  4. GERD - Continue famotidine as needed.  5. Return in about 6 months (around 03/02/2020). This can be an in-person, a virtual Webex or a telephone follow up visit.   Please inform us of any Emergency Department visits, hospitalizations, or changes in symptoms. Call us before going to the ED for breathing or allergy symptoms since we might be able to fit you in for a sick visit. Feel free to contact us anytime with any questions, problems, or concerns.  It was a pleasure to see you again today!  Websites that have reliable patient information: 1. American Academy of Asthma, Allergy, and Immunology: www.aaaai.org 2.  Food Allergy Research and Education (FARE): foodallergy.org 3. Mothers of Asthmatics: http://www.asthmacommunitynetwork.org 4. American College of Allergy, Asthma, and Immunology: www.acaai.org   COVID-19 Vaccine Information can be found at: [email or web address].[email or web address].uk For questions related to vaccine distribution or appointments, please email [email or web address] or call [phone].     "Like" [place] on Facebook and Instagram for our latest updates!       HAPPY SPRING!  Make sure you are registered to vote! If you have moved or changed any of your contact information, you will need to get this updated before voting!  In some cases, you MAY be able to register to vote online: [email or web address]
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
## 2019-09-03 ENCOUNTER — Encounter: Payer: Self-pay | Admitting: Allergy & Immunology

## 2019-09-05 ENCOUNTER — Telehealth: Payer: Self-pay | Admitting: *Deleted

## 2019-09-05 NOTE — Telephone Encounter (Signed)
Tried to contact patient to advise he needs to contact [place] to arrange shipment if he does not have number it is [phone]. Was unable to leave message mailbox full

## 2019-09-05 NOTE — Telephone Encounter (Signed)
-----   Message from [name], MD sent at 09/03/2019  7:03 AM EDT ----- He has stopped his Dupixent temporarily. He is going to restart, but unsure if they need something from our end to make that happen.

## 2019-09-12 NOTE — Telephone Encounter (Signed)
Tried to call patient to see if he was able to restart Dupixent and needed phone number to pharmacy but unable to leave message. Mailbox full

## 2019-09-15 DIAGNOSIS — J9611 Chronic respiratory failure with hypoxia: Secondary | ICD-10-CM | POA: Diagnosis not present

## 2019-09-15 DIAGNOSIS — J449 Chronic obstructive pulmonary disease, unspecified: Secondary | ICD-10-CM | POA: Diagnosis not present

## 2019-09-19 DIAGNOSIS — J449 Chronic obstructive pulmonary disease, unspecified: Secondary | ICD-10-CM | POA: Diagnosis not present

## 2019-09-19 DIAGNOSIS — G4733 Obstructive sleep apnea (adult) (pediatric): Secondary | ICD-10-CM | POA: Diagnosis not present

## 2019-09-19 DIAGNOSIS — R069 Unspecified abnormalities of breathing: Secondary | ICD-10-CM | POA: Diagnosis not present

## 2019-09-20 ENCOUNTER — Ambulatory Visit: Payer: BC Managed Care – PPO | Admitting: Podiatry

## 2019-09-20 ENCOUNTER — Ambulatory Visit: Payer: Self-pay

## 2019-09-20 ENCOUNTER — Encounter: Payer: Self-pay | Admitting: Physical Medicine and Rehabilitation

## 2019-09-20 ENCOUNTER — Ambulatory Visit: Payer: BC Managed Care – PPO | Admitting: Physical Medicine and Rehabilitation

## 2019-09-20 ENCOUNTER — Other Ambulatory Visit: Payer: Self-pay

## 2019-09-20 VITALS — BP 105/72 | HR 81

## 2019-09-20 DIAGNOSIS — M5416 Radiculopathy, lumbar region: Secondary | ICD-10-CM

## 2019-09-20 DIAGNOSIS — M5116 Intervertebral disc disorders with radiculopathy, lumbar region: Secondary | ICD-10-CM | POA: Diagnosis not present

## 2019-09-20 MED ORDER — METHYLPREDNISOLONE ACETATE 80 MG/ML IJ SUSP
40.0000 mg | Freq: Once | INTRAMUSCULAR | Status: AC
  Administered 2019-09-20: 40 mg

## 2019-09-20 NOTE — Progress Notes (Signed)
.  Numeric Pain Rating Scale and Functional Assessment Average Pain 7   In the last MONTH (on 0-10 scale) has pain interfered with the following?  1. General activity like being  able to carry out your everyday physical activities such as walking, climbing stairs, carrying groceries, or moving a chair?  Rating(7)   +Driver, -BT, -Dye Allergies.   

## 2019-09-21 NOTE — Progress Notes (Signed)
Mark Harrell - 59 y.o. male MRN [id number]  Date of birth: 13-Apr-1961  Office Visit Note: Visit Date: 09/20/2019 PCP: [name], MD Referred by: [name], MD  Subjective: Chief Complaint  Patient presents with  . Lower Back - Pain   HPI: [name] is a 59 y.o. male who comes in today For planned right L5-S1 interlaminar dural steroid injection at the request of Dr. [name].  I have seen the patient in the past for cervical injection a few years ago.  He has since had cervical fusion.  His case is complicated by morbid obesity and diabetes.  He is having low back pain with referral the right leg down to the foot and somewhat of an L5-S1 distribution.  He seems to had increasing pain since October 2019.  He rates his pain as a 7 out of 10.  MRI reviewed with the patient.  ROS Otherwise per HPI.  Assessment & Plan: Visit Diagnoses:  1. Lumbar radiculopathy   2. Radiculopathy due to lumbar intervertebral disc disorder     Plan: No additional findings.   Meds & Orders:  Meds ordered this encounter  Medications  . methylPREDNISolone acetate (DEPO-MEDROL) injection 40 mg    Orders Placed This Encounter  Procedures  . XR C-ARM NO REPORT  . Epidural Steroid injection    Follow-up: Return for visit to requesting physician as needed.   Procedures: No procedures performed  Lumbar Epidural Steroid Injection - Interlaminar Approach with Fluoroscopic Guidance  Patient: Mark Harrell      Date of Birth: [birth year] MRN: [id number] PCP: [name], MD      Visit Date: 09/20/2019   Universal Protocol:     Consent Given By: the patient  Position: PRONE  Additional Comments: Vital signs were monitored before and after the procedure. Patient was prepped and draped in the usual sterile fashion. The correct patient, procedure, and site was verified.   Injection Procedure Details:  Procedure Site One Meds Administered:  Meds ordered this encounter   Medications  . methylPREDNISolone acetate (DEPO-MEDROL) injection 40 mg     Laterality: Right  Location/Site:  L5-S1  Needle size: 20 G  Needle type: Tuohy  Needle Placement: Paramedian epidural  Findings:   -Comments: Excellent flow of contrast along the nerve and into the epidural space.  Procedure Details: Using a paramedian approach from the side mentioned above, the region overlying the inferior lamina was localized under fluoroscopic visualization and the soft tissues overlying this structure were infiltrated with 4 ml. of 1% Lidocaine without Epinephrine. The Tuohy needle was inserted into the epidural space using a paramedian approach.   The epidural space was localized using loss of resistance along with lateral and bi-planar fluoroscopic views.  After negative aspirate for air, blood, and CSF, a 2 ml. volume of Isovue-250 was injected into the epidural space and the flow of contrast was observed. Radiographs were obtained for documentation purposes.    The injectate was administered into the level noted above.   Additional Comments:  The patient tolerated the procedure well Dressing: 2 x 2 sterile gauze and Band-Aid    Post-procedure details: Patient was observed during the procedure. Post-procedure instructions were reviewed.  Patient left the clinic in stable condition.    Clinical History: MRI LUMBAR SPINE WITHOUT CONTRAST  TECHNIQUE: Multiplanar, multisequence MR imaging of the lumbar spine was performed. No intravenous contrast was administered.  COMPARISON:  Lumbar MRI 04/11/2018. CT lumbar spine 03/26/2018.  FINDINGS: Segmentation:  Normal as seen by CT, the same numbering system used on the prior MRI.  Alignment:  Stable lumbar lordosis.  Vertebrae: Minimal degenerative appearing marrow edema at the anterior L5-S1 endplates. Normal background bone marrow signal. No other acute osseous abnormality identified. Intact visible sacrum and SI  joints.  Conus medullaris and cauda equina: Conus extends to the T12 level. No lower spinal cord or conus signal abnormality.  Paraspinal and other soft tissues: Partially visible sigmoid diverticulosis. Otherwise negative Visualized abdominal viscera and paraspinal soft tissues.  Disc levels:  T11-T12: Mild disc bulging appears stable.  T12-L1:  Negative.  L1-L2:  Anterior disc bulging and endplate spurring. No stenosis.  L2-L3: Borderline to mild spinal stenosis mostly related to posterior element hypertrophy and mild epidural lipomatosis, stable.  L3-L4: Far lateral disc bulging and mild posterior element hypertrophy. Borderline to mild right L3 foraminal stenosis is stable.  L4-L5: Far lateral disc bulging and mild to moderate posterior element hypertrophy. No stenosis.  L5-S1: Chronic circumferential disc bulging with superimposed small central disc protrusion (series 6, image 38). Mild posterior element hypertrophy. Mild involvement of the lateral recesses (S1 nerve levels) without spinal stenosis. Stable borderline to mild L5 foraminal stenosis.  IMPRESSION: 1. Stable MRI appearance of the lumbar spine since 2019. 2. L5-S1 disc bulge and small central disc protrusion resulting in up to mild lateral recess and foraminal stenosis. 3. Far lateral disc bulging elsewhere with superimposed posterior element hypertrophy. Up to mild spinal stenosis at L2-L3 and right foraminal stenosis at L3-L4.   Electronically Signed   By: Genevie Ann M.D.   On: 03/07/2019 01:41   He reports that he has quit smoking. He has a 1.50 pack-year smoking history. He has never used smokeless tobacco.  Recent Labs    05/23/19 1513 05/26/19 1658 07/07/19 1524  HGBA1C 5.2 4.8 5.4    Objective:  VS:  HT:    WT:   BMI:     BP:105/72  HR:81bpm  TEMP: ( )  RESP:  Physical Exam  Ortho Exam  Imaging: XR C-ARM NO REPORT  Result Date: 09/20/2019 Please see Notes tab for  imaging impression.   Past Medical/Family/Surgical/Social History: Medications & Allergies reviewed per EMR, new medications updated. Patient Active Problem List   Diagnosis Date Noted  . Tracheomalacia 05/26/2019  . Acquired tracheomalacia 05/26/2019  . HNP (herniated nucleus pulposus) with myelopathy, cervical 05/23/2019    Class: Chronic  . Spinal stenosis of cervical region 05/23/2019    Class: Chronic  . Status post cervical spinal fusion 05/23/2019  . Chronic respiratory failure with hypoxia ([place]) 04/12/2018  . Hymenoptera allergy 11/10/2017  . Diverticulosis 08/07/2017  . Family history of colonic polyps 08/07/2017  . Myofascial pain 08/06/2017  . Seasonal and perennial allergic rhinitis 04/23/2017  . 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 Cervicalgia 04/01/2017  . Cervical spondylosis with radiculopathy 02/09/2017  . Memory difficulty 12/05/2016  . Morbid obesity ([place]) 12/05/2016  . Migraine without aura and without status migrainosus, not intractable 10/30/2016  . Lumbar radiculopathy 05/15/2016  . Osteoarthritis of spine with radiculopathy, lumbar region 05/15/2016  . Risk for falls 05/15/2016  . Recurrent infections 03/29/2016  . Chronic nonseasonal allergic rhinitis due to pollen 03/29/2016  . Polypharmacy 01/16/2016  . Morbid obesity with BMI of 50.0-59.9, adult ([place]) 03/06/2015  . SDAT 02/05/2015  . OSA and COPD overlap syndrome ([place]) 02/05/2015  . Medication management 08/02/2014  . GERD (gastroesophageal reflux disease) 05/09/2014  . Vitamin D deficiency 08/01/2013  . Prediabetes 08/01/2013  .  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 Diverticula of colon 05/07/2011  . Hypertension 01/31/2011  . Hyperlipidemia, mixed 01/31/2011  . BPH (benign prostatic hyperplasia) 01/31/2011  . Testosterone Deficiency 01/31/2011  . IBS (irritable bowel syndrome) 01/31/2011  . Partial complex seizure disorder with intractable epilepsy ([place]) 01/31/2011  . Depression, major, recurrent, in  partial remission ([place]) 01/31/2011  . Asthma-COPD overlap syndrome ([place]) 01/31/2011   Past Medical History:  Diagnosis Date  . Anesthesia complication requiring reversal agent administration    ? from central apnea, very difficult to get off vent  . Anxiety   . Arthritis    osteo  . Asthma   . BPH (benign prostatic hyperplasia)   . Complication of anesthesia    difficulty waking , they twlight me because of my respiratory problems "  . Depression   . Dyspnea    on exertion  . Enlarged heart   . Family history of adverse reaction to anesthesia    mother trouble waking up, and heart stopped  . GERD (gastroesophageal reflux disease)   . Headache    botox injections for headaches  . Hyperlipidemia   . Hypertension   . Hypogonadism male   . IBS (irritable bowel syndrome)   . Memory difficulties    short term memories  . Obesity   . On home oxygen therapy    on 2 liter  . OSA (obstructive sleep apnea)    not using cpap  . Pneumonia   . Pre-diabetes   . Prostatitis    Family History  Problem Relation Age of Onset  . Diabetes Paternal Uncle   . Cancer Father        lymphoma, colon  . Diabetes Maternal Grandmother   . Heart disease Maternal Grandfather   . Diabetes Maternal Grandfather   . Diabetes Paternal Grandmother   . Diabetes Paternal Grandfather   . Dementia Mother   . Prostate cancer Maternal Uncle   . Lung disease Neg Hx   . Rheumatologic disease Neg Hx    Past Surgical History:  Procedure Laterality Date  . ABDOMINAL SURGERY    . ANKLE FRACTURE SURGERY Right   . ANTERIOR CERVICAL DECOMP/DISCECTOMY FUSION N/A 05/23/2019   Procedure: ANTERIOR CERVICAL DISCECTOMY FUSION CERVICAL FIVE THROUGH CERVICAL SIX AND CERVICAL SIX THROUGH CERVICAL SEVEN;  Surgeon: Jessy Oto, MD;  Location: [facility];  Service: Orthopedics;  Laterality: N/A;  . COLONOSCOPY    . CYSTOSCOPY     [place]  . KNEE ARTHROSCOPY WITH MEDIAL MENISECTOMY Left 01/02/2017   Procedure: LEFT KNEE  ARTHROSCOPY WITH PARTIAL MEDIAL MENISCECTOMY;  Surgeon: Mcarthur Rossetti, MD;  Location: [facility];  Service: Orthopedics;  Laterality: Left;  . TONSILLECTOMY    . [place] RESECTION  2007  . UVULOPALATOPHARYNGOPLASTY     Social History   Occupational History  . Occupation: Dance movement psychotherapist  Tobacco Use  . Smoking status: Former Smoker    Packs/day: 0.10    Years: 15.00    Pack years: 1.50  . Smokeless tobacco: Never Used  . Tobacco comment: significant second-hand exposure through mother  Substance and Sexual Activity  . Alcohol use: Yes    Alcohol/week: 1.0 standard drinks    Types: 1 Glasses of wine per week    Comment: 2 x a year  . Drug use: No  . Sexual activity: Never

## 2019-09-21 NOTE — Procedures (Signed)
Lumbar Epidural Steroid Injection - Interlaminar Approach with Fluoroscopic Guidance  Patient: Mark Harrell      Date of Birth: 05-27-1960 MRN: [id number] PCP: [name], MD      Visit Date: 09/20/2019   Universal Protocol:     Consent Given By: the patient  Position: PRONE  Additional Comments: Vital signs were monitored before and after the procedure. Patient was prepped and draped in the usual sterile fashion. The correct patient, procedure, and site was verified.   Injection Procedure Details:  Procedure Site One Meds Administered:  Meds ordered this encounter  Medications  . methylPREDNISolone acetate (DEPO-MEDROL) injection 40 mg     Laterality: Right  Location/Site:  L5-S1  Needle size: 20 G  Needle type: Tuohy  Needle Placement: Paramedian epidural  Findings:   -Comments: Excellent flow of contrast along the nerve and into the epidural space.  Procedure Details: Using a paramedian approach from the side mentioned above, the region overlying the inferior lamina was localized under fluoroscopic visualization and the soft tissues overlying this structure were infiltrated with 4 ml. of 1% Lidocaine without Epinephrine. The Tuohy needle was inserted into the epidural space using a paramedian approach.   The epidural space was localized using loss of resistance along with lateral and bi-planar fluoroscopic views.  After negative aspirate for air, blood, and CSF, a 2 ml. volume of Isovue-250 was injected into the epidural space and the flow of contrast was observed. Radiographs were obtained for documentation purposes.    The injectate was administered into the level noted above.   Additional Comments:  The patient tolerated the procedure well Dressing: 2 x 2 sterile gauze and Band-Aid    Post-procedure details: Patient was observed during the procedure. Post-procedure instructions were reviewed.  Patient left the clinic in stable condition.

## 2019-09-26 ENCOUNTER — Other Ambulatory Visit: Payer: Self-pay | Admitting: Adult Health

## 2019-09-26 DIAGNOSIS — J449 Chronic obstructive pulmonary disease, unspecified: Secondary | ICD-10-CM

## 2019-10-03 DIAGNOSIS — G43709 Chronic migraine without aura, not intractable, without status migrainosus: Secondary | ICD-10-CM | POA: Diagnosis not present

## 2019-10-04 ENCOUNTER — Ambulatory Visit: Payer: BC Managed Care – PPO | Admitting: Podiatry

## 2019-10-04 ENCOUNTER — Encounter: Payer: Self-pay | Admitting: Podiatry

## 2019-10-04 ENCOUNTER — Other Ambulatory Visit: Payer: Self-pay

## 2019-10-04 DIAGNOSIS — M79676 Pain in unspecified toe(s): Secondary | ICD-10-CM | POA: Diagnosis not present

## 2019-10-04 DIAGNOSIS — B351 Tinea unguium: Secondary | ICD-10-CM | POA: Diagnosis not present

## 2019-10-04 DIAGNOSIS — M722 Plantar fascial fibromatosis: Secondary | ICD-10-CM | POA: Diagnosis not present

## 2019-10-06 DIAGNOSIS — G43709 Chronic migraine without aura, not intractable, without status migrainosus: Secondary | ICD-10-CM | POA: Diagnosis not present

## 2019-10-14 ENCOUNTER — Encounter: Payer: Self-pay | Admitting: Specialist

## 2019-10-14 ENCOUNTER — Ambulatory Visit: Payer: BC Managed Care – PPO | Admitting: Specialist

## 2019-10-14 ENCOUNTER — Ambulatory Visit: Payer: Self-pay

## 2019-10-14 ENCOUNTER — Other Ambulatory Visit: Payer: Self-pay

## 2019-10-14 VITALS — BP 136/83 | HR 73 | Ht 72.0 in | Wt 346.0 lb

## 2019-10-14 DIAGNOSIS — M5136 Other intervertebral disc degeneration, lumbar region: Secondary | ICD-10-CM | POA: Diagnosis not present

## 2019-10-14 DIAGNOSIS — M5416 Radiculopathy, lumbar region: Secondary | ICD-10-CM

## 2019-10-14 DIAGNOSIS — M4819 Ankylosing hyperostosis [Forestier], multiple sites in spine: Secondary | ICD-10-CM | POA: Diagnosis not present

## 2019-10-14 DIAGNOSIS — M25512 Pain in left shoulder: Secondary | ICD-10-CM

## 2019-10-14 DIAGNOSIS — M4807 Spinal stenosis, lumbosacral region: Secondary | ICD-10-CM

## 2019-10-14 DIAGNOSIS — Z981 Arthrodesis status: Secondary | ICD-10-CM

## 2019-10-14 DIAGNOSIS — R29898 Other symptoms and signs involving the musculoskeletal system: Secondary | ICD-10-CM

## 2019-10-14 DIAGNOSIS — G8929 Other chronic pain: Secondary | ICD-10-CM | POA: Diagnosis not present

## 2019-10-14 DIAGNOSIS — M25511 Pain in right shoulder: Secondary | ICD-10-CM | POA: Diagnosis not present

## 2019-10-14 DIAGNOSIS — M51369 Other intervertebral disc degeneration, lumbar region without mention of lumbar back pain or lower extremity pain: Secondary | ICD-10-CM

## 2019-10-14 DIAGNOSIS — G603 Idiopathic progressive neuropathy: Secondary | ICD-10-CM

## 2019-10-14 NOTE — Patient Instructions (Addendum)
Avoid overhead lifting and overhead use of the arms. Do not lift greater than 10-15 lbs. Adjust head rest in vehicle to prevent hyperextension if rear ended. Take extra precautions to avoid falling. Okay to go to soft collar during the daytime, remove at night for sleep, can sleep on back or side with pillows to level neck with bed. CT scan of the neck to assess healing of the C5-7 fusion. Neurology Dr. Sabra Heck referral for EMG/NCV of the legs to assess for S1 radiculopathy vs peripheral neuropathy. Physical therapy for bilateral shoulder weakness and pain for ROM Stretching and strengthening exercises. You should contact Pain management, [facility] and request an appointment, unfortunately I do not think I will be  Able relief all of the pain you are experiencing due to arthritis conditions and mild changes in the lumbar spine and DISH. Handouts for DISH given to this patient.

## 2019-10-14 NOTE — Progress Notes (Signed)
Office Visit Note   Patient: Mark Harrell           Date of Birth: 1961-01-29           MRN: [id number] Visit Date: 10/14/2019              Requested by: [name], [address] [place] [address] [place],  [place] [id number] PCP: [name], MD   Assessment & Plan:4 3/4 months post op C5 toC7 fusion with lucency at the C5-6 level superiorly Lumbar pain with  Visit Diagnoses:  1. Status post cervical spinal fusion   2. Forestier's disease of multiple sites   3. Spinal stenosis of lumbosacral region   4. DDD (degenerative disc disease), lumbar   5. Idiopathic progressive neuropathy   6. Radiculopathy, lumbar region   7. Weakness of shoulder   8. Acute pain of right shoulder   9. Chronic left shoulder pain     Plan: Avoid overhead lifting and overhead use of the arms. Do not lift greater than 10-15 lbs. Adjust head rest in vehicle to prevent hyperextension if rear ended. Take extra precautions to avoid falling. Okay to go to soft collar during the daytime, remove at night for sleep, can sleep on back or side with pillows to level neck with bed. CT scan of the neck to assess healing of the C5-7 fusion. Neurology Dr. Sabra Heck referral for EMG/NCV of the legs to assess for S1 radiculopathy vs peripheral neuropathy. Physical therapy for bilateral shoulder weakness and pain for ROM Stretching and strengthening exercises. You should contact Pain management, [facility] and request an appointment, unfortunately I do not think I will be  Able relief all of the pain you are experiencing due to arthritis conditions and mild changes in the lumbar spine and DISH. Handouts for DISH given to this patient.   Orders:  Orders Placed This Encounter  Procedures  . XR Cervical Spine 2 or 3 views  . CT CERVICAL SPINE WO CONTRAST  . Ambulatory referral to Neurology  . Ambulatory referral to Physical Therapy  . Ambulatory referral to Pain Clinic   No orders of the  defined types were placed in this encounter.     Procedures: No procedures performed   Clinical Data: No additional findings.   Subjective: Chief Complaint  Patient presents with  . Neck - Follow-up    Still aches and hurts, gets numbness in hands and arms,   . Lower Back - Follow-up    He had a Right L5-S1 IL injection with Dr. Ernestina Patches, states that it helped with sciatic pain, but not with basic low back problems    59 year old male with history of C5-6 and C6-7 ACDF for cervical stenosis. He has findings of DISH and was in a pain management program with Dr. Vira Blanco prior to having falls and studies showing cervical stenosis. Post op he has had low back pain and shoulder pain. Left shoulder MRI was significant for mild OA of the right AC joint. The left shoulder is also painful. No bowel or bladder difficulty. He would like to restart diclofenac. He has been advised by his urologist not to take NSAIDs. Pain in the neck is more a stiffness but with an occasional popping sensation with discomfort. His follow up radiographs show persistent motion at the C5-7 fusion site. He underwent L5-S1 ESI with relief of sciatica pain in the legs and he relates there is pain that persists in the back and buttock. Lumbar  MRI from 02/2020 showed DDD L5-S1 with mild subarticular stenosis bilateral that would affect S1 roots and mild foramenal narrowing L5 bilaterally.     Review of Systems  Constitutional: Negative for activity change, appetite change, chills, diaphoresis, fatigue, fever and unexpected weight change.  HENT: Negative for congestion, dental problem, drooling, ear discharge, ear pain, facial swelling, hearing loss, mouth sores, nosebleeds, postnasal drip, rhinorrhea, sinus pressure, sinus pain, sneezing, sore throat, tinnitus, trouble swallowing and voice change.   Eyes: Negative.   Respiratory: Negative.   Cardiovascular: Negative.   Gastrointestinal: Negative.   Endocrine: Negative.     Genitourinary: Negative.   Musculoskeletal: Negative.   Skin: Negative.   Allergic/Immunologic: Negative.   Neurological: Negative.   Hematological: Negative.   Psychiatric/Behavioral: Negative.      Objective: Vital Signs: BP 136/83 (BP Location: Left Arm, Patient Position: Sitting)   Pulse 73   Ht 6' (1.829 m)   Wt (!) 346 lb (156.9 kg)   BMI 46.93 kg/m   Physical Exam Constitutional:      General: He is not in acute distress.    Appearance: He is well-developed. He is not ill-appearing, toxic-appearing or diaphoretic.  HENT:     Head: Normocephalic and atraumatic.     Right Ear: External ear normal.     Left Ear: External ear normal.     Nose: No congestion or rhinorrhea.     Mouth/Throat:     Pharynx: No oropharyngeal exudate or posterior oropharyngeal erythema.  Eyes:     Pupils: Pupils are equal, round, and reactive to light.  Neck:     Vascular: No carotid bruit.  Cardiovascular:     Rate and Rhythm: Normal rate. Rhythm irregular.     Heart sounds: No murmur. No friction rub. No gallop.   Pulmonary:     Effort: Pulmonary effort is normal.     Breath sounds: Normal breath sounds.  Abdominal:     General: Bowel sounds are normal.     Palpations: Abdomen is soft.  Musculoskeletal:        General: No swelling, tenderness, deformity or signs of injury.     Cervical back: Rigidity present. No tenderness.     Lumbar back: Negative right straight leg raise test and negative left straight leg raise test.     Right lower leg: No edema.     Left lower leg: No edema.  Lymphadenopathy:     Cervical: No cervical adenopathy.  Skin:    General: Skin is warm and dry.  Neurological:     Mental Status: He is alert and oriented to person, place, and time.     Cranial Nerves: No cranial nerve deficit.     Sensory: No sensory deficit.     Motor: Weakness present.     Coordination: Coordination normal.     Gait: Gait abnormal.     Deep Tendon Reflexes: Reflexes normal.   Psychiatric:        Behavior: Behavior normal.        Thought Content: Thought content normal.        Judgment: Judgment normal.     Back Exam   Tenderness  The patient is experiencing tenderness in the cervical and lumbar.  Range of Motion  Extension:  20 abnormal  Flexion:  40 abnormal  Lateral bend right: abnormal  Lateral bend left: abnormal  Rotation right: abnormal  Rotation left: abnormal   Muscle Strength  Right Quadriceps:  5/5  Left Quadriceps:  5/5  Right Hamstrings:  5/5  Left Hamstrings:  5/5   Tests  Straight leg raise right: negative Straight leg raise left: negative  Reflexes  Patellar: normal Achilles: normal Biceps: normal Babinski's sign: normal   Other  Toe walk: abnormal Heel walk: abnormal Sensation: decreased Gait: abnormal  Erythema: no back redness Scars: present  Comments:  Incision left neck is healed. There is no swelling of the cervical spine. The lumbar spine is tender. There is weakness in the shoulder abduction and biceps 5-/5 and in wrist volar flexion though finger extension and wrist extension is intact. Finger abduction is intact.  No falls since surgery.       Specialty Comments:  No specialty comments available.  Imaging: XR Cervical Spine 2 or 3 views  Result Date: 10/14/2019 C5-6 and C6-7 with plate and screws and bone plugs there is lucency at the superior margin of the C5-6 graft and flexion and extension radiographs do not show movement of the posterior elements at C5-6. The C6-7 level is suspicious for persistent motion. There is very little movement over the anterior cervical spine in flexion and extension.    PMFS History: Patient Active Problem List   Diagnosis Date Noted  . HNP (herniated nucleus pulposus) with myelopathy, cervical 05/23/2019    Priority: High    Class: Chronic  . Spinal stenosis of cervical region 05/23/2019    Priority: High    Class: Chronic  . Tracheomalacia 05/26/2019  .  Acquired tracheomalacia 05/26/2019  . Status post cervical spinal fusion 05/23/2019  . Chronic respiratory failure with hypoxia ([place]) 04/12/2018  . Hymenoptera allergy 11/10/2017  . Diverticulosis 08/07/2017  . Family history of colonic polyps 08/07/2017  . Myofascial pain 08/06/2017  . Seasonal and perennial allergic rhinitis 04/23/2017  . 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 Cervicalgia 04/01/2017  . Cervical spondylosis with radiculopathy 02/09/2017  . Memory difficulty 12/05/2016  . Morbid obesity ([place]) 12/05/2016  . Migraine without aura and without status migrainosus, not intractable 10/30/2016  . Lumbar radiculopathy 05/15/2016  . Osteoarthritis of spine with radiculopathy, lumbar region 05/15/2016  . Risk for falls 05/15/2016  . Recurrent infections 03/29/2016  . Chronic nonseasonal allergic rhinitis due to pollen 03/29/2016  . Polypharmacy 01/16/2016  . Morbid obesity with BMI of 50.0-59.9, adult ([place]) 03/06/2015  . SDAT 02/05/2015  . OSA and COPD overlap syndrome ([place]) 02/05/2015  . Medication management 08/02/2014  . GERD (gastroesophageal reflux disease) 05/09/2014  . Vitamin D deficiency 08/01/2013  . Prediabetes 08/01/2013  . 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 Diverticula of colon 05/07/2011  . Hypertension 01/31/2011  . Hyperlipidemia, mixed 01/31/2011  . BPH (benign prostatic hyperplasia) 01/31/2011  . Testosterone Deficiency 01/31/2011  . IBS (irritable bowel syndrome) 01/31/2011  . Partial complex seizure disorder with intractable epilepsy ([place]) 01/31/2011  . Depression, major, recurrent, in partial remission ([place]) 01/31/2011  . Asthma-COPD overlap syndrome ([place]) 01/31/2011   Past Medical History:  Diagnosis Date  . Anesthesia complication requiring reversal agent administration    ? from central apnea, very difficult to get off vent  . Anxiety   . Arthritis    osteo  . Asthma   . BPH (benign prostatic hyperplasia)   . Complication of anesthesia     difficulty waking , they twlight me because of my respiratory problems "  . Depression   . Dyspnea    on exertion  . Enlarged heart   . Family history of adverse reaction to anesthesia    mother trouble waking up,  and heart stopped  . GERD (gastroesophageal reflux disease)   . Headache    botox injections for headaches  . Hyperlipidemia   . Hypertension   . Hypogonadism male   . IBS (irritable bowel syndrome)   . Memory difficulties    short term memories  . Obesity   . On home oxygen therapy    on 2 liter  . OSA (obstructive sleep apnea)    not using cpap  . Pneumonia   . Pre-diabetes   . Prostatitis     Family History  Problem Relation Age of Onset  . Diabetes Paternal Uncle   . Cancer Father        lymphoma, colon  . Diabetes Maternal Grandmother   . Heart disease Maternal Grandfather   . Diabetes Maternal Grandfather   . Diabetes Paternal Grandmother   . Diabetes Paternal Grandfather   . Dementia Mother   . Prostate cancer Maternal Uncle   . Lung disease Neg Hx   . Rheumatologic disease Neg Hx     Past Surgical History:  Procedure Laterality Date  . ABDOMINAL SURGERY    . ANKLE FRACTURE SURGERY Right   . ANTERIOR CERVICAL DECOMP/DISCECTOMY FUSION N/A 05/23/2019   Procedure: ANTERIOR CERVICAL DISCECTOMY FUSION CERVICAL FIVE THROUGH CERVICAL SIX AND CERVICAL SIX THROUGH CERVICAL SEVEN;  Surgeon: Jessy Oto, MD;  Location: [facility];  Service: Orthopedics;  Laterality: N/A;  . COLONOSCOPY    . CYSTOSCOPY     [place]  . KNEE ARTHROSCOPY WITH MEDIAL MENISECTOMY Left 01/02/2017   Procedure: LEFT KNEE ARTHROSCOPY WITH PARTIAL MEDIAL MENISCECTOMY;  Surgeon: Mcarthur Rossetti, MD;  Location: [facility];  Service: Orthopedics;  Laterality: Left;  . TONSILLECTOMY    . [place] RESECTION  2007  . UVULOPALATOPHARYNGOPLASTY     Social History   Occupational History  . Occupation: Dance movement psychotherapist  Tobacco Use  . Smoking status: Former Smoker    Packs/day:  0.10    Years: 15.00    Pack years: 1.50  . Smokeless tobacco: Never Used  . Tobacco comment: significant second-hand exposure through mother  Substance and Sexual Activity  . Alcohol use: Yes    Alcohol/week: 1.0 standard drinks    Types: 1 Glasses of wine per week    Comment: 2 x a year  . Drug use: No  . Sexual activity: Never

## 2019-10-15 DIAGNOSIS — J9611 Chronic respiratory failure with hypoxia: Secondary | ICD-10-CM | POA: Diagnosis not present

## 2019-10-15 DIAGNOSIS — J449 Chronic obstructive pulmonary disease, unspecified: Secondary | ICD-10-CM | POA: Diagnosis not present

## 2019-10-19 DIAGNOSIS — G4733 Obstructive sleep apnea (adult) (pediatric): Secondary | ICD-10-CM | POA: Diagnosis not present

## 2019-10-19 DIAGNOSIS — R069 Unspecified abnormalities of breathing: Secondary | ICD-10-CM | POA: Diagnosis not present

## 2019-10-19 DIAGNOSIS — J449 Chronic obstructive pulmonary disease, unspecified: Secondary | ICD-10-CM | POA: Diagnosis not present

## 2019-10-28 NOTE — Progress Notes (Signed)
He presents today for follow-up of his plantar fasciitis which he states is been bothering him more again recently.  He is also complaining of painfully elongated toenails.  Objective: Toenails are long thick yellow dystrophic-like mycotic painful palpation.  He also has pain on palpation mid calcaneal tubercles bilateral.  Assessment: Injected the bilateral heels and divided toenails 1 through 5 bilateral.

## 2019-10-31 ENCOUNTER — Ambulatory Visit
Admission: RE | Admit: 2019-10-31 | Discharge: 2019-10-31 | Disposition: A | Discharge status: Home / Self Care | Payer: BC Managed Care – PPO | Source: Ambulatory Visit | Attending: Specialist | Admitting: Specialist

## 2019-10-31 ENCOUNTER — Other Ambulatory Visit: Payer: BC Managed Care – PPO

## 2019-10-31 DIAGNOSIS — M4802 Spinal stenosis, cervical region: Secondary | ICD-10-CM | POA: Diagnosis not present

## 2019-10-31 DIAGNOSIS — Z981 Arthrodesis status: Secondary | ICD-10-CM

## 2019-11-03 ENCOUNTER — Telehealth: Payer: Self-pay | Admitting: *Deleted

## 2019-11-03 NOTE — Telephone Encounter (Signed)
Received fax from [facility] requesting xray results of cervical spine, results were faxed back to [phone]

## 2019-11-09 ENCOUNTER — Telehealth: Payer: Self-pay | Admitting: Specialist

## 2019-11-09 NOTE — Telephone Encounter (Signed)
Received vm from [place] w/ [facility]. Needs records & xry reports still from referral. I re-faxed [phone]

## 2019-11-10 ENCOUNTER — Encounter: Payer: Self-pay | Admitting: Surgery

## 2019-11-10 ENCOUNTER — Ambulatory Visit (INDEPENDENT_AMBULATORY_CARE_PROVIDER_SITE_OTHER): Payer: BC Managed Care – PPO | Admitting: Surgery

## 2019-11-10 DIAGNOSIS — Z981 Arthrodesis status: Secondary | ICD-10-CM

## 2019-11-10 NOTE — Progress Notes (Signed)
59 year old white male who is status post C5-6 and C6-7 ACDF May 15, 2019 returns to review CT cervical spine that was performed November 01, 2019.  Report showed:  EXAM: CT CERVICAL SPINE WITHOUT CONTRAST  TECHNIQUE: Multidetector CT imaging of the cervical spine was performed without intravenous contrast. Multiplanar CT image reconstructions were also generated.  COMPARISON:  Cervical MRI 03/06/2019  FINDINGS: Alignment: Normal.  Skull base and vertebrae: C5-6 and C6-7 ACDF with solid arthrodesis. The ventral plate is in good position. No evidence of fracture or bone lesion  Soft tissues and spinal canal: No prevertebral fluid or swelling. No visible canal hematoma.  Disc levels:  C2-3: Asymmetric right uncovertebral and endplate ridging with mild to moderate right foraminal narrowing  C3-4: Disc narrowing with asymmetric right uncovertebral ridging and moderate right foraminal impingement  C4-5: Ventral spondylitic spurring.  No evidence of impingement  C5-6: ACDF with solid arthrodesis.  C6-7: ACDF with solid arthrodesis. Notable uncovertebral spurs with moderate bilateral residual narrowing.  C7-T1:Narrow disc with mild left foraminal narrowing  Upper chest: Negative  IMPRESSION: 1. C5-C7 ACDF with solid arthrodesis. 2. Mild-to-moderate right foraminal narrowing at C2-3 and C3-4 from disc height loss and uncovertebral ridging.   Electronically Signed   By: Monte Fantasia M.D.   On: 11/01/2019 10:47  States that neck symptoms are unchanged.    Plan I reviewed report with patient. advised him that I am limited as to what I can recommend for him and that Dr. [name] will need to review the study and discuss any treatment options that are indicated.  I will have him follow-up with Dr. [name] Monday morning November 14, 2019.  Patient will continue using his cervical collar and Dr. [name] can also make decision at that point as to whether or not he  needs to keep this on.

## 2019-11-14 ENCOUNTER — Ambulatory Visit: Payer: BC Managed Care – PPO | Admitting: Specialist

## 2019-11-15 DIAGNOSIS — J9611 Chronic respiratory failure with hypoxia: Secondary | ICD-10-CM | POA: Diagnosis not present

## 2019-11-15 DIAGNOSIS — J449 Chronic obstructive pulmonary disease, unspecified: Secondary | ICD-10-CM | POA: Diagnosis not present

## 2019-11-19 DIAGNOSIS — R069 Unspecified abnormalities of breathing: Secondary | ICD-10-CM | POA: Diagnosis not present

## 2019-11-19 DIAGNOSIS — J449 Chronic obstructive pulmonary disease, unspecified: Secondary | ICD-10-CM | POA: Diagnosis not present

## 2019-11-19 DIAGNOSIS — G4733 Obstructive sleep apnea (adult) (pediatric): Secondary | ICD-10-CM | POA: Diagnosis not present

## 2019-11-30 ENCOUNTER — Telehealth: Payer: Self-pay | Admitting: Specialist

## 2019-11-30 NOTE — Telephone Encounter (Signed)
Received call from [facility]  Dr. Drue Dun office  stated patient was told to arrive an hour early at 1:20pm to complete paperwork. Patient's appointment was at 2:20pm.  Patient left appointment stating he could not wait any longer. The number to contact [facility] if needed if [phone]

## 2019-12-14 DIAGNOSIS — M4722 Other spondylosis with radiculopathy, cervical region: Secondary | ICD-10-CM | POA: Diagnosis not present

## 2019-12-14 DIAGNOSIS — G43709 Chronic migraine without aura, not intractable, without status migrainosus: Secondary | ICD-10-CM | POA: Diagnosis not present

## 2019-12-14 DIAGNOSIS — J449 Chronic obstructive pulmonary disease, unspecified: Secondary | ICD-10-CM | POA: Diagnosis not present

## 2019-12-14 DIAGNOSIS — G4733 Obstructive sleep apnea (adult) (pediatric): Secondary | ICD-10-CM | POA: Diagnosis not present

## 2019-12-15 ENCOUNTER — Encounter: Payer: Self-pay | Admitting: Specialist

## 2019-12-15 ENCOUNTER — Ambulatory Visit: Payer: BC Managed Care – PPO | Admitting: Specialist

## 2019-12-15 DIAGNOSIS — J9611 Chronic respiratory failure with hypoxia: Secondary | ICD-10-CM | POA: Diagnosis not present

## 2019-12-15 DIAGNOSIS — J449 Chronic obstructive pulmonary disease, unspecified: Secondary | ICD-10-CM | POA: Diagnosis not present

## 2019-12-16 NOTE — Telephone Encounter (Signed)
I dont see order in this pt chart for Nerve study.

## 2019-12-16 NOTE — Telephone Encounter (Signed)
Holding for [name] to discuss with JN.  Last office visit was 11/10/2019 with [name]. Patient was to return 11/14/2019 to review CT scan but cancelled appointment.

## 2019-12-19 DIAGNOSIS — G4733 Obstructive sleep apnea (adult) (pediatric): Secondary | ICD-10-CM | POA: Diagnosis not present

## 2019-12-19 DIAGNOSIS — R069 Unspecified abnormalities of breathing: Secondary | ICD-10-CM | POA: Diagnosis not present

## 2019-12-19 DIAGNOSIS — J449 Chronic obstructive pulmonary disease, unspecified: Secondary | ICD-10-CM | POA: Diagnosis not present

## 2019-12-19 DIAGNOSIS — E291 Testicular hypofunction: Secondary | ICD-10-CM | POA: Diagnosis not present

## 2019-12-19 DIAGNOSIS — R351 Nocturia: Secondary | ICD-10-CM | POA: Diagnosis not present

## 2019-12-19 DIAGNOSIS — N401 Enlarged prostate with lower urinary tract symptoms: Secondary | ICD-10-CM | POA: Diagnosis not present

## 2019-12-20 ENCOUNTER — Other Ambulatory Visit: Payer: Self-pay | Admitting: Radiology

## 2019-12-20 DIAGNOSIS — M5136 Other intervertebral disc degeneration, lumbar region: Secondary | ICD-10-CM

## 2019-12-20 DIAGNOSIS — M4807 Spinal stenosis, lumbosacral region: Secondary | ICD-10-CM

## 2019-12-20 DIAGNOSIS — M5416 Radiculopathy, lumbar region: Secondary | ICD-10-CM

## 2019-12-22 ENCOUNTER — Ambulatory Visit: Payer: BC Managed Care – PPO | Admitting: Specialist

## 2019-12-26 DIAGNOSIS — R351 Nocturia: Secondary | ICD-10-CM | POA: Diagnosis not present

## 2019-12-26 DIAGNOSIS — E291 Testicular hypofunction: Secondary | ICD-10-CM | POA: Diagnosis not present

## 2019-12-26 DIAGNOSIS — R350 Frequency of micturition: Secondary | ICD-10-CM | POA: Diagnosis not present

## 2019-12-26 DIAGNOSIS — N401 Enlarged prostate with lower urinary tract symptoms: Secondary | ICD-10-CM | POA: Diagnosis not present

## 2019-12-27 DIAGNOSIS — G43709 Chronic migraine without aura, not intractable, without status migrainosus: Secondary | ICD-10-CM | POA: Diagnosis not present

## 2020-01-05 ENCOUNTER — Ambulatory Visit: Payer: BC Managed Care – PPO | Admitting: Podiatry

## 2020-01-05 ENCOUNTER — Other Ambulatory Visit: Payer: Self-pay

## 2020-01-05 ENCOUNTER — Encounter: Payer: Self-pay | Admitting: Podiatry

## 2020-01-05 DIAGNOSIS — M722 Plantar fascial fibromatosis: Secondary | ICD-10-CM

## 2020-01-05 DIAGNOSIS — M79676 Pain in unspecified toe(s): Secondary | ICD-10-CM | POA: Diagnosis not present

## 2020-01-05 DIAGNOSIS — B351 Tinea unguium: Secondary | ICD-10-CM | POA: Diagnosis not present

## 2020-01-05 NOTE — Progress Notes (Signed)
He presents today chief complaint of painfully elongated toenails and painful heels bilaterally.  He has a history of plantar fasciitis which we injected a couple times before.  On his complaining of painful elongated toenails and his inability to reach down to cut them because of his back and neck.  Objective: Vital signs are stable he is alert oriented x3.  Pulses are palpable.  He has pain on palpation medial calcaneal tubercles bilaterally he also has long thick yellow dystrophic-like mycotic nails sharply incurvated nail margins which are painful.  Assessment: Pain in limb secondary to onychomycosis pain in limb secondary plantar fasciitis bilateral.  Plan: Discussed etiology pathology conservative versus surgical therapies at this point I injected bilaterally 20 mg Kenalog 5 mg Marcaine to the more point of maximal tenderness.  I also debrided nails 1 through 5 bilateral cover service secondary to pain and dystrophy.  Follow-up with him in 3 months

## 2020-01-09 ENCOUNTER — Encounter: Payer: Self-pay | Admitting: Internal Medicine

## 2020-01-09 NOTE — Patient Instructions (Signed)
Due to recent changes in healthcare laws, you may see the results of your imaging and laboratory studies on MyChart before your provider has had a chance to review them.  We understand that in some cases there may be results that are confusing or concerning to you. Not all laboratory results come back in the same time frame and the provider may be waiting for multiple results in order to interpret others.  Please give us 48 hours in order for your provider to thoroughly review all the results before contacting the office for clarification of your results.   ++++++++++++++++++++++++++++++++++++++  Vit D  & Vit C 1,000 mg   are recommended to help protect  against the Covid-19 and other Corona viruses.    Also it's recommended  to take  Zinc 50 mg  to help  protect against the Covid-19   and best place to get  is also on [employer]  and don't pay more than 6-8 cents /pill !  =============================== Coronavirus (COVID-19) Are you at risk?  Are you at risk for the Coronavirus (COVID-19)?  To be considered HIGH RISK for Coronavirus (COVID-19), you have to meet the following criteria:  . Traveled to [place], [place], [place], [place] or [place]; or in the [place] to [place], [place], [place]  . or [place]; and have fever, cough, and shortness of breath within the last 2 weeks of travel OR . Been in close contact with a person diagnosed with COVID-19 within the last 2 weeks and have  . fever, cough,and shortness of breath .  . IF YOU DO NOT MEET THESE CRITERIA, YOU ARE CONSIDERED LOW RISK FOR COVID-19.  What to do if you are HIGH RISK for COVID-19?  . If you are having a medical emergency, call 911. . Seek medical care right away. Before you go to a doctor's office, urgent care or emergency department, .  call ahead and tell them about your recent travel, contact with someone diagnosed with COVID-19  .  and your symptoms.  . You should receive instructions from your  physician's office regarding next steps of care.  . When you arrive at healthcare provider, tell the healthcare staff immediately you have returned from  . visiting [place], [place], [place], [place] or [place]; or traveled in the [place] to [place], [place],  . [place] or [place] in the last two weeks or you have been in close contact with a person diagnosed with  . COVID-19 in the last 2 weeks.   . Tell the health care staff about your symptoms: fever, cough and shortness of breath. . After you have been seen by a medical provider, you will be either: o Tested for (COVID-19) and discharged home on quarantine except to seek medical care if  o symptoms worsen, and asked to  - Stay home and avoid contact with others until you get your results (4-5 days)  - Avoid travel on public transportation if possible (such as bus, train, or airplane) or o Sent to the Emergency Department by EMS for evaluation, COVID-19 testing  and  o possible admission depending on your condition and test results.  What to do if you are LOW RISK for COVID-19?  Reduce your risk of any infection by using the same precautions used for avoiding the common cold or flu:  . Wash your hands often with soap and warm water for at least 20 seconds.  If soap and water are not readily   available,  . use an alcohol-based hand sanitizer with at least 60% alcohol.  . If coughing or sneezing, cover your mouth and nose by coughing or sneezing into the elbow areas of your shirt or coat, .  into a tissue or into your sleeve (not your hands). . Avoid shaking hands with others and consider head nods or verbal greetings only. . Avoid touching your eyes, nose, or mouth with unwashed hands.  . Avoid close contact with people who are sick. . Avoid places or events with large numbers of people in one location, like concerts or sporting events. . Carefully consider travel plans you have or are making. . If you are planning any travel  outside or inside the [place], visit the CDC's Travelers' Health webpage for the latest health notices. . If you have some symptoms but not all symptoms, continue to monitor at home and seek medical attention  . if your symptoms worsen. . If you are having a medical emergency, call 911. >>>>>>>>>>>>>>>>>>>>>>>>>>>> Preventive Care for Adults  A healthy lifestyle and preventive care can promote health and wellness. Preventive health guidelines for men include the following key practices:  A routine yearly physical is a good way to check with your health care provider about your health and preventative screening. It is a chance to share any concerns and updates on your health and to receive a thorough exam.  Visit your dentist for a routine exam and preventative care every 6 months. Brush your teeth twice a day and floss once a day. Good oral hygiene prevents tooth decay and gum disease.  The frequency of eye exams is based on your age, health, family medical history, use of contact lenses, and other factors. Follow your health care provider's recommendations for frequency of eye exams.  Eat a healthy diet. Foods such as vegetables, fruits, whole grains, low-fat dairy products, and lean protein foods contain the nutrients you need without too many calories. Decrease your intake of foods high in solid fats, added sugars, and salt. Eat the right amount of calories for you. Get information about a proper diet from your health care provider, if necessary.  Regular physical exercise is one of the most important things you can do for your health. Most adults should get at least 150 minutes of moderate-intensity exercise (any activity that increases your heart rate and causes you to sweat) each week. In addition, most adults need muscle-strengthening exercises on 2 or more days a week.  Maintain a healthy weight. The body mass index (BMI) is a screening tool to identify possible weight problems. It provides an  estimate of body fat based on height and weight. Your health care provider can find your BMI and can help you achieve or maintain a healthy weight. For adults 20 years and older:  A BMI below 18.5 is considered underweight.  A BMI of 18.5 to 24.9 is normal.  A BMI of 25 to 29.9 is considered overweight.  A BMI of 30 and above is considered obese.  Maintain normal blood lipids and cholesterol levels by exercising and minimizing your intake of saturated fat. Eat a balanced diet with plenty of fruit and vegetables. Blood tests for lipids and cholesterol should begin at age 20 and be repeated every 5 years. If your lipid or cholesterol levels are high, you are over 50, or you are at high risk for heart disease, you may need your cholesterol levels checked more frequently. Ongoing high lipid and cholesterol levels should be treated   with medicines if diet and exercise are not working.  If you smoke, find out from your health care provider how to quit. If you do not use tobacco, do not start.  Lung cancer screening is recommended for adults aged 55-80 years who are at high risk for developing lung cancer because of a history of smoking. A yearly low-dose CT scan of the lungs is recommended for people who have at least a 30-pack-year history of smoking and are a current smoker or have quit within the past 15 years. A pack year of smoking is smoking an average of 1 pack of cigarettes a day for 1 year (for example: 1 pack a day for 30 years or 2 packs a day for 15 years). Yearly screening should continue until the smoker has stopped smoking for at least 15 years. Yearly screening should be stopped for people who develop a health problem that would prevent them from having lung cancer treatment.  If you choose to drink alcohol, do not have more than 2 drinks per day. One drink is considered to be 12 ounces (355 mL) of beer, 5 ounces (148 mL) of wine, or 1.5 ounces (44 mL) of liquor.  Avoid use of street  drugs. Do not share needles with anyone. Ask for help if you need support or instructions about stopping the use of drugs.  High blood pressure causes heart disease and increases the risk of stroke. Your blood pressure should be checked at least every 1-2 years. Ongoing high blood pressure should be treated with medicines, if weight loss and exercise are not effective.  If you are 45-79 years old, ask your health care provider if you should take aspirin to prevent heart disease.  Diabetes screening involves taking a blood sample to check your fasting blood sugar level. This should be done once every 3 years, after age 45, if you are within normal weight and without risk factors for diabetes. Testing should be considered at a younger age or be carried out more frequently if you are overweight and have at least 1 risk factor for diabetes.  Colorectal cancer can be detected and often prevented. Most routine colorectal cancer screening begins at the age of 50 and continues through age 75. However, your health care provider may recommend screening at an earlier age if you have risk factors for colon cancer. On a yearly basis, your health care provider may provide home test kits to check for hidden blood in the stool. Use of a small camera at the end of a tube to directly examine the colon (sigmoidoscopy or colonoscopy) can detect the earliest forms of colorectal cancer. Talk to your health care provider about this at age 50, when routine screening begins. Direct exam of the colon should be repeated every 5-10 years through age 75, unless early forms of precancerous polyps or small growths are found.   Talk with your health care provider about prostate cancer screening.  Testicular cancer screening isrecommended for adult males. Screening includes self-exam, a health care provider exam, and other screening tests. Consult with your health care provider about any symptoms you have or any concerns you have about  testicular cancer.  Use sunscreen. Apply sunscreen liberally and repeatedly throughout the day. You should seek shade when your shadow is shorter than you. Protect yourself by wearing long sleeves, pants, a wide-brimmed hat, and sunglasses year round, whenever you are outdoors.  Once a month, do a whole-body skin exam, using a mirror to look at   the skin on your back. Tell your health care provider about new moles, moles that have irregular borders, moles that are larger than a pencil eraser, or moles that have changed in shape or color.  Stay current with required vaccines (immunizations).  Influenza vaccine. All adults should be immunized every year.  Tetanus, diphtheria, and acellular pertussis (Td, Tdap) vaccine. An adult who has not previously received Tdap or who does not know his vaccine status should receive 1 dose of Tdap. This initial dose should be followed by tetanus and diphtheria toxoids (Td) booster doses every 10 years. Adults with an unknown or incomplete history of completing a 3-dose immunization series with Td-containing vaccines should begin or complete a primary immunization series including a Tdap dose. Adults should receive a Td booster every 10 years.  Varicella vaccine. An adult without evidence of immunity to varicella should receive 2 doses or a second dose if he has previously received 1 dose.  Human papillomavirus (HPV) vaccine. Males aged 13-21 years who have not received the vaccine previously should receive the 3-dose series. Males aged 22-26 years may be immunized. Immunization is recommended through the age of 26 years for any male who has sex with males and did not get any or all doses earlier. Immunization is recommended for any person with an immunocompromised condition through the age of 26 years if he did not get any or all doses earlier. During the 3-dose series, the second dose should be obtained 4-8 weeks after the first dose. The third dose should be obtained  24 weeks after the first dose and 16 weeks after the second dose.  Zoster vaccine. One dose is recommended for adults aged 60 years or older unless certain conditions are present.    PREVNAR  - Pneumococcal 13-valent conjugate (PCV13) vaccine. When indicated, a person who is uncertain of his immunization history and has no record of immunization should receive the PCV13 vaccine. An adult aged 19 years or older who has certain medical conditions and has not been previously immunized should receive 1 dose of PCV13 vaccine. This PCV13 should be followed with a dose of pneumococcal polysaccharide (PPSV23) vaccine. The PPSV23 vaccine dose should be obtained at least 1 r more year(s) after the dose of PCV13 vaccine. An adult aged 19 years or older who has certain medical conditions and previously received 1 or more doses of PPSV23 vaccine should receive 1 dose of PCV13. The PCV13 vaccine dose should be obtained 1 or more years after the last PPSV23 vaccine dose.    PNEUMOVAX - Pneumococcal polysaccharide (PPSV23) vaccine. When PCV13 is also indicated, PCV13 should be obtained first. All adults aged 65 years and older should be immunized. An adult younger than age 65 years who has certain medical conditions should be immunized. Any person who resides in a nursing home or long-term care facility should be immunized. An adult smoker should be immunized. People with an immunocompromised condition and certain other conditions should receive both PCV13 and PPSV23 vaccines. People with human immunodeficiency virus (HIV) infection should be immunized as soon as possible after diagnosis. Immunization during chemotherapy or radiation therapy should be avoided. Routine use of PPSV23 vaccine is not recommended for American Indians, [place] Natives, or people younger than 65 years unless there are medical conditions that require PPSV23 vaccine. When indicated, people who have unknown immunization and have no record of  immunization should receive PPSV23 vaccine. One-time revaccination 5 years after the first dose of PPSV23 is recommended for people aged   19-64 years who have chronic kidney failure, nephrotic syndrome, asplenia, or immunocompromised conditions. People who received 1-2 doses of PPSV23 before age 65 years should receive another dose of PPSV23 vaccine at age 65 years or later if at least 5 years have passed since the previous dose. Doses of PPSV23 are not needed for people immunized with PPSV23 at or after age 65 years.    Hepatitis A vaccine. Adults who wish to be protected from this disease, have certain high-risk conditions, work with hepatitis A-infected animals, work in hepatitis A research labs, or travel to or work in countries with a high rate of hepatitis A should be immunized. Adults who were previously unvaccinated and who anticipate close contact with an international adoptee during the first 60 days after arrival in the [place] from a country with a high rate of hepatitis A should be immunized.    Hepatitis B vaccine. Adults should be immunized if they wish to be protected from this disease, have certain high-risk conditions, may be exposed to blood or other infectious body fluids, are household contacts or sex partners of hepatitis B positive people, are clients or workers in certain care facilities, or travel to or work in countries with a high rate of hepatitis B.   Preventive Service / Frequency   Ages 40 to 64  Blood pressure check.  Lipid and cholesterol check  Lung cancer screening. / Every year if you are aged 55-80 years and have a 30-pack-year history of smoking and currently smoke or have quit within the past 15 years. Yearly screening is stopped once you have quit smoking for at least 15 years or develop a health problem that would prevent you from having lung cancer treatment.  Fecal occult blood test (FOBT) of stool. / Every year beginning at age 50 and continuing  until age 75. You may not have to do this test if you get a colonoscopy every 10 years.  Flexible sigmoidoscopy** or colonoscopy.** / Every 5 years for a flexible sigmoidoscopy or every 10 years for a colonoscopy beginning at age 50 and continuing until age 75. Screening for abdominal aortic aneurysm (AAA)  by ultrasound is recommended for people who have history of high blood pressure or who are current or former smokers. +++++++++++ Recommend Adult Low Dose Aspirin or  coated  Aspirin 81 mg daily  To reduce risk of Colon Cancer 40 %,  Skin Cancer 26 % ,  Malignant Melanoma 46%  and  Pancreatic cancer 60% ++++++++++++++++++++ Vitamin D goal  is between 70-100.  Please make sure that you are taking your Vitamin D as directed.  It is very important as a natural anti-inflammatory  helping hair, skin, and nails, as well as reducing stroke and heart attack risk.  It helps your bones and helps with mood. It also decreases numerous cancer risks so please take it as directed.  Low Vit D is associated with a 200-300% higher risk for CANCER  and 200-300% higher risk for HEART   ATTACK  &  STROKE.   ...................................... It is also associated with higher death rate at younger ages,  autoimmune diseases like Rheumatoid arthritis, Lupus, Multiple Sclerosis.    Also many other serious conditions, like depression, Alzheimer's Dementia, infertility, muscle aches, fatigue, fibromyalgia - just to name a few. +++++++++++++++++++++ Recommend the book "The END of DIETING" by Dr Joel Fuhrman  & the book "The END of DIABETES " by Dr Joel Fuhrman At [employer] - get book & Audio CD's      Being diabetic has a  300% increased risk for heart attack, stroke, cancer, and alzheimer- type vascular dementia. It is very important that you work harder with diet by avoiding all foods that are white. Avoid white rice (brown & wild rice is OK), white potatoes (sweetpotatoes in moderation is OK), White  bread or wheat bread or anything made out of white flour like bagels, donuts, rolls, buns, biscuits, cakes, pastries, cookies, pizza crust, and pasta (made from white flour & egg whites) - vegetarian pasta or spinach or wheat pasta is OK. Multigrain breads like Arnold's or Pepper[employer], or multigrain sandwich thins or flatbreads.  Diet, exercise and weight loss can reverse and cure diabetes in the early stages.  Diet, exercise and weight loss is very important in the control and prevention of complications of diabetes which affects every system in your body, ie. Brain - dementia/stroke, eyes - glaucoma/blindness, heart - heart attack/heart failure, kidneys - dialysis, stomach - gastric paralysis, intestines - malabsorption, nerves - severe painful neuritis, circulation - gangrene & loss of a leg(s), and finally cancer and Alzheimers.    I recommend avoid fried & greasy foods,  sweets/candy, white rice (brown or wild rice or Quinoa is OK), white potatoes (sweet potatoes are OK) - anything made from white flour - bagels, doughnuts, rolls, buns, biscuits,white and wheat breads, pizza crust and traditional pasta made of white flour & egg white(vegetarian pasta or spinach or wheat pasta is OK).  Multi-grain bread is OK - like multi-grain flat bread or sandwich thins. Avoid alcohol in excess. Exercise is also important.    Eat all the vegetables you want - avoid meat, especially red meat and dairy - especially cheese.  Cheese is the most concentrated form of trans-fats which is the worst thing to clog up our arteries. Veggie cheese is OK which can be found in the fresh produce section at [employer] or [employer] or [employer]  ++++++++++++++++++++++ DASH Eating Plan  DASH stands for "Dietary Approaches to Stop Hypertension."   The DASH eating plan is a healthy eating plan that has been shown to reduce high blood pressure (hypertension). Additional health benefits may include reducing the risk of type 2  diabetes mellitus, heart disease, and stroke. The DASH eating plan may also help with weight loss. WHAT DO I NEED TO KNOW ABOUT THE DASH EATING PLAN? For the DASH eating plan, you will follow these general guidelines:  Choose foods with a percent daily value for sodium of less than 5% (as listed on the food label).  Use salt-free seasonings or herbs instead of table salt or sea salt.  Check with your health care provider or pharmacist before using salt substitutes.  Eat lower-sodium products, often labeled as "lower sodium" or "no salt added."  Eat fresh foods.  Eat more vegetables, fruits, and low-fat dairy products.  Choose whole grains. Look for the word "whole" as the first word in the ingredient list.  Choose fish   Limit sweets, desserts, sugars, and sugary drinks.  Choose heart-healthy fats.  Eat veggie cheese   Eat more home-cooked food and less restaurant, buffet, and fast food.  Limit fried foods.  Cook foods using methods other than frying.  Limit canned vegetables. If you do use them, rinse them well to decrease the sodium.  When eating at a restaurant, ask that your food be prepared with less salt, or no salt if possible.                        WHAT FOODS CAN I EAT? Read Dr Joel Fuhrman's books on The End of Dieting & The End of Diabetes  Grains Whole grain or whole wheat bread. Brown rice. Whole grain or whole wheat pasta. Quinoa, bulgur, and whole grain cereals. Low-sodium cereals. Corn or whole wheat flour tortillas. Whole grain cornbread. Whole grain crackers. Low-sodium crackers.  Vegetables Fresh or frozen vegetables (raw, steamed, roasted, or grilled). Low-sodium or reduced-sodium tomato and vegetable juices. Low-sodium or reduced-sodium tomato sauce and paste. Low-sodium or reduced-sodium canned vegetables.   Fruits All fresh, canned (in natural juice), or frozen fruits.  Protein Products  All fish and seafood.  Dried beans, peas, or lentils.  Unsalted nuts and seeds. Unsalted canned beans.  Dairy Low-fat dairy products, such as skim or 1% milk, 2% or reduced-fat cheeses, low-fat ricotta or cottage cheese, or plain low-fat yogurt. Low-sodium or reduced-sodium cheeses.  Fats and Oils Tub margarines without trans fats. Light or reduced-fat mayonnaise and salad dressings (reduced sodium). Avocado. Safflower, olive, or canola oils. Natural peanut or almond butter.  Other Unsalted popcorn and pretzels. The items listed above may not be a complete list of recommended foods or beverages. Contact your dietitian for more options.  +++++++++++++++++++  WHAT FOODS ARE NOT RECOMMENDED? Grains/ White flour or wheat flour White bread. White pasta. White rice. Refined cornbread. Bagels and croissants. Crackers that contain trans fat.  Vegetables  Creamed or fried vegetables. Vegetables in a . Regular canned vegetables. Regular canned tomato sauce and paste. Regular tomato and vegetable juices.  Fruits Dried fruits. Canned fruit in light or heavy syrup. Fruit juice.  Meat and Other Protein Products Meat in general - RED meat & White meat.  Fatty cuts of meat. Ribs, chicken wings, all processed meats as bacon, sausage, bologna, salami, fatback, hot dogs, bratwurst and packaged luncheon meats.  Dairy Whole or 2% milk, cream, half-and-half, and cream cheese. Whole-fat or sweetened yogurt. Full-fat cheeses or blue cheese. Non-dairy creamers and whipped toppings. Processed cheese, cheese spreads, or cheese curds.  Condiments Onion and garlic salt, seasoned salt, table salt, and sea salt. Canned and packaged gravies. Worcestershire sauce. Tartar sauce. Barbecue sauce. Teriyaki sauce. Soy sauce, including reduced sodium. Steak sauce. Fish sauce. Oyster sauce. Cocktail sauce. Horseradish. Ketchup and mustard. Meat flavorings and tenderizers. Bouillon cubes. Hot sauce. Tabasco sauce. Marinades. Taco seasonings. Relishes.  Fats and Oils Butter,  stick margarine, lard, shortening and bacon fat. Coconut, palm kernel, or palm oils. Regular salad dressings.  Pickles and olives. Salted popcorn and pretzels.  The items listed above may not be a complete list of foods and beverages to avoid.    
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
## 2020-01-10 ENCOUNTER — Encounter: Payer: Self-pay | Admitting: Internal Medicine

## 2020-01-10 ENCOUNTER — Ambulatory Visit (INDEPENDENT_AMBULATORY_CARE_PROVIDER_SITE_OTHER): Payer: BC Managed Care – PPO | Admitting: Internal Medicine

## 2020-01-10 ENCOUNTER — Other Ambulatory Visit: Payer: Self-pay

## 2020-01-10 VITALS — BP 134/88 | HR 64 | Temp 97.6°F | Resp 18 | Ht 72.0 in | Wt 351.8 lb

## 2020-01-10 DIAGNOSIS — E1169 Type 2 diabetes mellitus with other specified complication: Secondary | ICD-10-CM

## 2020-01-10 DIAGNOSIS — N181 Chronic kidney disease, stage 1: Secondary | ICD-10-CM | POA: Diagnosis not present

## 2020-01-10 DIAGNOSIS — Z79899 Other long term (current) drug therapy: Secondary | ICD-10-CM | POA: Diagnosis not present

## 2020-01-10 DIAGNOSIS — N401 Enlarged prostate with lower urinary tract symptoms: Secondary | ICD-10-CM

## 2020-01-10 DIAGNOSIS — N138 Other obstructive and reflux uropathy: Secondary | ICD-10-CM

## 2020-01-10 DIAGNOSIS — E785 Hyperlipidemia, unspecified: Secondary | ICD-10-CM | POA: Diagnosis not present

## 2020-01-10 DIAGNOSIS — E559 Vitamin D deficiency, unspecified: Secondary | ICD-10-CM

## 2020-01-10 DIAGNOSIS — J449 Chronic obstructive pulmonary disease, unspecified: Secondary | ICD-10-CM

## 2020-01-10 DIAGNOSIS — R7303 Prediabetes: Secondary | ICD-10-CM

## 2020-01-10 DIAGNOSIS — Z0001 Encounter for general adult medical examination with abnormal findings: Secondary | ICD-10-CM

## 2020-01-10 DIAGNOSIS — R5383 Other fatigue: Secondary | ICD-10-CM | POA: Diagnosis not present

## 2020-01-10 DIAGNOSIS — E1122 Type 2 diabetes mellitus with diabetic chronic kidney disease: Secondary | ICD-10-CM

## 2020-01-10 DIAGNOSIS — Z1211 Encounter for screening for malignant neoplasm of colon: Secondary | ICD-10-CM

## 2020-01-10 DIAGNOSIS — I10 Essential (primary) hypertension: Secondary | ICD-10-CM

## 2020-01-10 DIAGNOSIS — Z136 Encounter for screening for cardiovascular disorders: Secondary | ICD-10-CM | POA: Diagnosis not present

## 2020-01-10 DIAGNOSIS — Z8249 Family history of ischemic heart disease and other diseases of the circulatory system: Secondary | ICD-10-CM

## 2020-01-10 DIAGNOSIS — Z111 Encounter for screening for respiratory tuberculosis: Secondary | ICD-10-CM

## 2020-01-10 DIAGNOSIS — M4722 Other spondylosis with radiculopathy, cervical region: Secondary | ICD-10-CM

## 2020-01-10 DIAGNOSIS — G43009 Migraine without aura, not intractable, without status migrainosus: Secondary | ICD-10-CM

## 2020-01-10 DIAGNOSIS — G4733 Obstructive sleep apnea (adult) (pediatric): Secondary | ICD-10-CM

## 2020-01-10 MED ORDER — PREGABALIN 150 MG PO CAPS: ORAL_CAPSULE | ORAL | 1 refills | Status: DC

## 2020-01-11 ENCOUNTER — Other Ambulatory Visit: Payer: Self-pay | Admitting: Internal Medicine

## 2020-01-11 DIAGNOSIS — J449 Chronic obstructive pulmonary disease, unspecified: Secondary | ICD-10-CM

## 2020-01-11 LAB — CBC WITH DIFFERENTIAL/PLATELET
Absolute Monocytes: 1066 cells/uL — ABNORMAL HIGH (ref 200–950)
Basophils Absolute: 124 cells/uL (ref 0–200)
Basophils Relative: 1 %
Eosinophils Absolute: 50 cells/uL (ref 15–500)
Eosinophils Relative: 0.4 %
HCT: 49.7 % (ref 38.5–50.0)
Hemoglobin: 16.6 g/dL (ref 13.2–17.1)
Lymphs Abs: 2864 cells/uL (ref 850–3900)
MCH: 29.3 pg (ref 27.0–33.0)
MCHC: 33.4 g/dL (ref 32.0–36.0)
MCV: 87.8 fL (ref 80.0–100.0)
MPV: 10.5 fL (ref 7.5–12.5)
Monocytes Relative: 8.6 %
Neutro Abs: 8296 cells/uL — ABNORMAL HIGH (ref 1500–7800)
Neutrophils Relative %: 66.9 %
Platelets: 319 10*3/uL (ref 140–400)
RBC: 5.66 10*6/uL (ref 4.20–5.80)
RDW: 13.5 % (ref 11.0–15.0)
Total Lymphocyte: 23.1 %
WBC: 12.4 10*3/uL — ABNORMAL HIGH (ref 3.8–10.8)

## 2020-01-11 LAB — MICROALBUMIN / CREATININE URINE RATIO
Creatinine, Urine: 109 mg/dL (ref 20–320)
Microalb Creat Ratio: 6 mcg/mg creat (ref <30)
Microalb, Ur: 0.7 mg/dL

## 2020-01-11 LAB — MAGNESIUM: Magnesium: 1.7 mg/dL (ref 1.5–2.5)

## 2020-01-11 LAB — COMPLETE METABOLIC PANEL WITH GFR
AG Ratio: 1.9 (calc) (ref 1.0–2.5)
ALT: 18 U/L (ref 9–46)
AST: 16 U/L (ref 10–35)
Albumin: 4.1 g/dL (ref 3.6–5.1)
Alkaline phosphatase (APISO): 58 U/L (ref 35–144)
BUN: 14 mg/dL (ref 7–25)
CO2: 27 mmol/L (ref 20–32)
Calcium: 10.1 mg/dL (ref 8.6–10.3)
Chloride: 102 mmol/L (ref 98–110)
Creat: 0.86 mg/dL (ref 0.70–1.33)
GFR, Est African American: 110 mL/min/{1.73_m2} (ref >=60)
GFR, Est Non African American: 95 mL/min/{1.73_m2} (ref >=60)
Globulin: 2.2 g/dL (calc) (ref 1.9–3.7)
Glucose, Bld: 100 mg/dL — ABNORMAL HIGH (ref 65–99)
Potassium: 4.3 mmol/L (ref 3.5–5.3)
Sodium: 140 mmol/L (ref 135–146)
Total Bilirubin: 1.1 mg/dL (ref 0.2–1.2)
Total Protein: 6.3 g/dL (ref 6.1–8.1)

## 2020-01-11 LAB — VITAMIN B12: Vitamin B-12: 580 pg/mL (ref 200–1100)

## 2020-01-11 LAB — LIPID PANEL
Cholesterol: 153 mg/dL (ref <200)
HDL: 35 mg/dL — ABNORMAL LOW (ref >=40)
LDL Cholesterol (Calc): 86 mg/dL (calc)
Non-HDL Cholesterol (Calc): 118 mg/dL (calc) (ref <130)
Total CHOL/HDL Ratio: 4.4 (calc) (ref <5.0)
Triglycerides: 218 mg/dL — ABNORMAL HIGH (ref <150)

## 2020-01-11 LAB — IRON, TOTAL/TOTAL IRON BINDING CAP
%SAT: 18 % (calc) — ABNORMAL LOW (ref 20–48)
Iron: 66 ug/dL (ref 50–180)
TIBC: 360 mcg/dL (calc) (ref 250–425)

## 2020-01-11 LAB — URINALYSIS, ROUTINE W REFLEX MICROSCOPIC
Bilirubin Urine: NEGATIVE
Glucose, UA: NEGATIVE
Hgb urine dipstick: NEGATIVE
Ketones, ur: NEGATIVE
Leukocytes,Ua: NEGATIVE
Nitrite: NEGATIVE
Protein, ur: NEGATIVE
Specific Gravity, Urine: 1.018 (ref 1.001–1.03)
pH: 5.5 (ref 5.0–8.0)

## 2020-01-11 LAB — INSULIN, RANDOM: Insulin: 40.7 u[IU]/mL — ABNORMAL HIGH

## 2020-01-11 LAB — PSA: PSA: 0.4 ng/mL (ref <=4.0)

## 2020-01-11 LAB — TESTOSTERONE: Testosterone: 332 ng/dL (ref 250–827)

## 2020-01-11 LAB — VITAMIN D 25 HYDROXY (VIT D DEFICIENCY, FRACTURES): Vit D, 25-Hydroxy: 106 ng/mL — ABNORMAL HIGH (ref 30–100)

## 2020-01-11 LAB — HEMOGLOBIN A1C
Hgb A1c MFr Bld: 5.3 % of total Hgb (ref <5.7)
Mean Plasma Glucose: 105 (calc)
eAG (mmol/L): 5.8 (calc)

## 2020-01-11 LAB — TSH: TSH: 1.62 mIU/L (ref 0.40–4.50)

## 2020-01-11 NOTE — Progress Notes (Signed)
============================================================  -   Test results slightly outside the reference range are not unusual. If there is anything important, I will review this with you,  otherwise it is considered normal test values.  If you have further questions,  please do not hesitate to contact me at the office or via My Chart.  ============================================================  -  Vitamin B12 level is Normal   - Iron level is slightly low  - So recommend  eat More Veggies with Iron as Carrots, Beets , all  leafy green veggies as Spinach, Collards,  Turnip - Mustard or Mixed Greens, Kale, Asparagus, Broccoli, Brussel Sprouts, Green Beans / peas, Soybeans, Lentils, Sweet Potatoes ============================================================  -  PSA - Low - Great  ============================================================  -  Testosterone - Norma, but low normal,  so be sure remembering to take your Zinc which may help raise Testosterone levels naturally. ============================================================  -  Total Chol -= 153 and LDL Chol = 86 - Both  Excellent   - Very low risk for Heart Attack  / Stroke =============================================================  - But Triglycerides (   218   ) or fats in blood are too high  (goal is less than 150)    - Recommend avoid fried & greasy foods,  sweets / candy,   - Avoid white rice  (brown or wild rice or Quinoa is OK),   - Avoid white potatoes  (sweet potatoes are OK)   - Avoid anything made from white flour  - bagels, doughnuts, rolls, buns, biscuits, white and   wheat breads, pizza crust and traditional  pasta made of white flour & egg white  - (vegetarian pasta or spinach or wheat pasta is OK).    - Multi-grain bread is OK - like multi-grain flat bread or  sandwich thins.   - Avoid alcohol in excess.   - Exercise is also  important. ============================================================  -  A1c - Normal - Great - No Diabetes ============================================================  -  Vitamin = 106 - borderline - high normal   - So Please cut down on Vitamin D from 30,000 units /Daily to 25,000 units /day  ============================================================  -  All Else - CBC - Kidneys - Electrolytes - Liver - Magnesium & Thyroid    - all  Normal / OK ====================================================   - Keep up the great Work ============================================================

## 2020-01-12 DIAGNOSIS — G43709 Chronic migraine without aura, not intractable, without status migrainosus: Secondary | ICD-10-CM | POA: Diagnosis not present

## 2020-01-15 DIAGNOSIS — J449 Chronic obstructive pulmonary disease, unspecified: Secondary | ICD-10-CM | POA: Diagnosis not present

## 2020-01-15 DIAGNOSIS — J9611 Chronic respiratory failure with hypoxia: Secondary | ICD-10-CM | POA: Diagnosis not present

## 2020-01-18 DIAGNOSIS — J449 Chronic obstructive pulmonary disease, unspecified: Secondary | ICD-10-CM | POA: Diagnosis not present

## 2020-01-19 ENCOUNTER — Other Ambulatory Visit: Payer: Self-pay | Admitting: Internal Medicine

## 2020-01-19 ENCOUNTER — Ambulatory Visit: Payer: BC Managed Care – PPO | Admitting: Specialist

## 2020-01-19 DIAGNOSIS — R069 Unspecified abnormalities of breathing: Secondary | ICD-10-CM | POA: Diagnosis not present

## 2020-01-19 DIAGNOSIS — G4733 Obstructive sleep apnea (adult) (pediatric): Secondary | ICD-10-CM | POA: Diagnosis not present

## 2020-01-19 DIAGNOSIS — J449 Chronic obstructive pulmonary disease, unspecified: Secondary | ICD-10-CM | POA: Diagnosis not present

## 2020-01-25 ENCOUNTER — Other Ambulatory Visit: Payer: Self-pay | Admitting: Internal Medicine

## 2020-01-28 ENCOUNTER — Other Ambulatory Visit: Payer: Self-pay | Admitting: Allergy & Immunology

## 2020-01-31 ENCOUNTER — Encounter: Payer: Self-pay | Admitting: Physical Medicine and Rehabilitation

## 2020-02-01 DIAGNOSIS — H524 Presbyopia: Secondary | ICD-10-CM | POA: Diagnosis not present

## 2020-02-01 DIAGNOSIS — H04123 Dry eye syndrome of bilateral lacrimal glands: Secondary | ICD-10-CM | POA: Diagnosis not present

## 2020-02-03 ENCOUNTER — Other Ambulatory Visit: Payer: Self-pay | Admitting: Internal Medicine

## 2020-02-15 DIAGNOSIS — J9611 Chronic respiratory failure with hypoxia: Secondary | ICD-10-CM | POA: Diagnosis not present

## 2020-02-15 DIAGNOSIS — J449 Chronic obstructive pulmonary disease, unspecified: Secondary | ICD-10-CM | POA: Diagnosis not present

## 2020-02-17 ENCOUNTER — Telehealth: Payer: Self-pay | Admitting: Physical Medicine and Rehabilitation

## 2020-02-17 NOTE — Telephone Encounter (Signed)
Patient called requesting a call back. Patient needs to reschedule do to exposure of covid. I stated to patient a negative testing report for covid would need to be done submitted. Patient would like to schedule appt 14 days out. Please call patient about this matter. Patient phone number is [phone].

## 2020-02-19 DIAGNOSIS — R069 Unspecified abnormalities of breathing: Secondary | ICD-10-CM | POA: Diagnosis not present

## 2020-02-19 DIAGNOSIS — G4733 Obstructive sleep apnea (adult) (pediatric): Secondary | ICD-10-CM | POA: Diagnosis not present

## 2020-02-19 DIAGNOSIS — J449 Chronic obstructive pulmonary disease, unspecified: Secondary | ICD-10-CM | POA: Diagnosis not present

## 2020-02-20 NOTE — Telephone Encounter (Signed)
Called back and left message to call us back #1

## 2020-02-20 NOTE — Telephone Encounter (Signed)
Pt called and reschedule foe 03/12/20. Do to covid exposure.

## 2020-02-21 ENCOUNTER — Ambulatory Visit: Payer: BC Managed Care – PPO | Admitting: Physical Medicine and Rehabilitation

## 2020-02-23 ENCOUNTER — Ambulatory Visit: Payer: BC Managed Care – PPO | Admitting: Specialist

## 2020-02-26 ENCOUNTER — Other Ambulatory Visit: Payer: Self-pay | Admitting: Allergy & Immunology

## 2020-03-06 ENCOUNTER — Encounter: Payer: Self-pay | Admitting: Allergy & Immunology

## 2020-03-06 ENCOUNTER — Ambulatory Visit: Payer: BC Managed Care – PPO | Admitting: Allergy & Immunology

## 2020-03-06 ENCOUNTER — Other Ambulatory Visit: Payer: Self-pay

## 2020-03-06 VITALS — BP 122/82 | HR 71 | Temp 98.6°F | Resp 16 | Ht 72.0 in | Wt 386.0 lb

## 2020-03-06 DIAGNOSIS — J302 Other seasonal allergic rhinitis: Secondary | ICD-10-CM

## 2020-03-06 DIAGNOSIS — L2084 Intrinsic (allergic) eczema: Secondary | ICD-10-CM

## 2020-03-06 DIAGNOSIS — B999 Unspecified infectious disease: Secondary | ICD-10-CM | POA: Diagnosis not present

## 2020-03-06 DIAGNOSIS — J3089 Other allergic rhinitis: Secondary | ICD-10-CM

## 2020-03-06 DIAGNOSIS — J449 Chronic obstructive pulmonary disease, unspecified: Secondary | ICD-10-CM

## 2020-03-06 NOTE — Progress Notes (Signed)
FOLLOW UP  Date of Service/Encounter:  03/06/20   Assessment:   Perennial and seasonalallergic rhinitis(sweet vernal grass, box elder, cat, weeds, ragweed, molds, cockroach, dust mite)  Recurrent infections-doing well onprophylactic doxycycline today  Asthma-COPD overlap syndrome-onTrelegy, but wanting to restart Dupixent (getting repeat AEC)   Hymenoptera allergy  GERD - onPRN H2 blocker  Obesity - with resulting nerve impingement and joint pain  Migraines - on botulinuminjections  Polypharmacy  Plan/Recommendations:   1. 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  2. Asthma-COPD overlap syndrome - Lung testing looked stable today. - We are going to get labs to see if we can get you back on the [place].  - We will call you with the results.  - Daily controller medication(s): Trelegy 100/62.5/25 one puff once daily - Prior to physical activity: ProAir 2 puffs 10-15 minutes before physical activity. - Rescue medications: ProAir 4 puffs every 4-6 hours as needed or DuoNeb nebulizer one vial every 4-6 hours as needed - Asthma control goals:  * Full participation in all desired activities (may need albuterol before activity) * Albuterol use two time or less a week on average (not counting use with activity) * Cough interfering with sleep two time or less a month * Oral steroids no more than once a year * No hospitalizations   3. Chronic allergic rhinitis (sweet vernal grass, box elder, cat, weeds, ragweed, molds, cockroach, dust mite) - Continue with aszelastine/fluticasone 2 sprays per nostril up to twice daily. - Continue with saline mist 1-2 times daily.  - Continue with cetirizine 10mg  daily as needed for breakthrough symptoms.  4. GERD - Continue famotidine as needed.  5. Return in about 4 months (around 07/07/2020).    Subjective:   Mark Harrell is a 59 y.o. male presenting today for follow up of  Chief Complaint  Patient presents with  . Asthma    No concerns at this time, says he has not been doing too much and going out. He will start going out soon and wants to start dupixit again.    Mark Harrell has a history of the following: Patient Active Problem List   Diagnosis Date Noted  . Tracheomalacia 05/26/2019  . Acquired tracheomalacia 05/26/2019  . HNP (herniated nucleus pulposus) with myelopathy, cervical 05/23/2019    Class: Chronic  . Spinal stenosis of cervical region 05/23/2019    Class: Chronic  . Status post cervical spinal fusion 05/23/2019  . Chronic respiratory failure with hypoxia ([place]) 04/12/2018  . Hymenoptera allergy 11/10/2017  . Diverticulosis 08/07/2017  . Family history of colonic polyps 08/07/2017  . Myofascial pain 08/06/2017  . Seasonal and perennial allergic rhinitis 04/23/2017  . 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 Cervicalgia 04/01/2017  . Cervical spondylosis with radiculopathy 02/09/2017  . Memory difficulty 12/05/2016  . Morbid obesity ([place]) 12/05/2016  . Migraine without aura and without status migrainosus, not intractable 10/30/2016  . Lumbar radiculopathy 05/15/2016  . Osteoarthritis of spine with radiculopathy, lumbar region 05/15/2016  . Risk for falls 05/15/2016  . Recurrent infections 03/29/2016  . Chronic nonseasonal allergic rhinitis due to pollen 03/29/2016  . Polypharmacy 01/16/2016  . Morbid obesity with BMI of 50.0-59.9, adult ([place]) 03/06/2015  . SDAT 02/05/2015  . OSA and COPD overlap syndrome ([place]) 02/05/2015  . Medication management 08/02/2014  . GERD (gastroesophageal reflux disease) 05/09/2014  . Vitamin D deficiency 08/01/2013  .  Prediabetes 08/01/2013  . 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 Diverticula of colon 05/07/2011  . Hypertension 01/31/2011  . Hyperlipidemia, mixed 01/31/2011  . BPH (benign prostatic hyperplasia) 01/31/2011  .  Testosterone Deficiency 01/31/2011  . IBS (irritable bowel syndrome) 01/31/2011  . Partial complex seizure disorder with intractable epilepsy ([place]) 01/31/2011  . Depression, major, recurrent, in partial remission ([place]) 01/31/2011  . Asthma-COPD overlap syndrome ([employer]) 01/31/2011    History obtained from: chart review and patient.  Mark Harrell is a 59 y.o. male presenting for a follow up visit. He was last seen in April 2021. At that time, we continued with his prophylactic doxy. For his asthma, we continued with Trelegy one puff once daily. Allergic rhinitis was controlled with Dymista as well as nasal saline and cetirizine. Famotidine was continued for his GERD.   Since the last visit, he has done well. He has been isolated for his back issues. He ready to get out and about and is interested in restarting his Dupixent to put him on that route.   Asthma/Respiratory Symptom History: He remains on the Trelegy one puff once daily. He feels that is working well for the most part. He is not working his rescue inhaler much at all. He wants to restart Dupixent. He felt comfortable with that.  He last had Dupixent months ago. He was hospitalized in early January 2021 for respiratory failure. This was following his back surgery. This was the last time that he got any systemic steroids at all.   Allergic Rhinitis Symptom History: He rmeains on the Dymista as well as the antihistamine daily. He is on the prophylactic doxycycline for his recurrent infections. He did have some antibiotics in the beginning of the year as well as the summer. He had sinusitis once and urosepsis on a few occasions. The last time that he was hospitalized was when he was in the hospital earlier this year for respiratory failure.   GERD Symptom History: He is not on any medications for his reflux at all. He is hoping that he remains stable.   He continues to care for his mother. She is currently suffering from dementia. This has been  difficult for him to manage.    Otherwise, there have been no changes to his past medical history, surgical history, family history, or social history.    Review of Systems  Constitutional: Positive for malaise/fatigue. Negative for chills, fever and weight loss.  HENT: Negative for congestion, ear discharge, ear pain, sinus pain and sore throat.   Eyes: Negative for pain, discharge and redness.  Respiratory: Positive for shortness of breath. Negative for cough, sputum production and wheezing.   Cardiovascular: Negative.  Negative for chest pain and palpitations.  Gastrointestinal: Negative for abdominal pain, constipation, diarrhea, heartburn, nausea and vomiting.  Skin: Positive for itching.  Neurological: Negative for dizziness and headaches.  Endo/Heme/Allergies: Positive for environmental allergies. Does not bruise/bleed easily.       Objective:   Blood pressure 122/82, pulse 71, temperature 98.6 F (37 C), resp. rate 16, height 6' (1.829 m), weight (!) 386 lb (175.1 kg), SpO2 91 %. Body mass index is 52.35 kg/m.   Physical Exam:  Physical Exam Constitutional:      Appearance: He is well-developed. He is obese.     Comments: Using a cane. Slow talker.   HENT:     Head: Normocephalic and atraumatic.     Right Ear: Tympanic membrane, ear canal and external ear normal.     Left  Ear: Tympanic membrane, ear canal and external ear normal.     Nose: No nasal deformity, septal deviation, mucosal edema or rhinorrhea.     Right Turbinates: Enlarged and swollen.     Left Turbinates: Enlarged and swollen.     Right Sinus: No maxillary sinus tenderness or frontal sinus tenderness.     Left Sinus: No maxillary sinus tenderness or frontal sinus tenderness.     Mouth/Throat:     Mouth: Mucous membranes are not pale and not dry.     Pharynx: Uvula midline.  Eyes:     General: Allergic shiner present.        Right eye: No discharge.        Left eye: No discharge.      Conjunctiva/sclera: Conjunctivae normal.     Right eye: Right conjunctiva is not injected. No chemosis.    Left eye: Left conjunctiva is not injected. No chemosis.    Pupils: Pupils are equal, round, and reactive to light.  Cardiovascular:     Rate and Rhythm: Normal rate and regular rhythm.     Heart sounds: Normal heart sounds.  Pulmonary:     Effort: Pulmonary effort is normal. No tachypnea, accessory muscle usage or respiratory distress.     Breath sounds: Normal breath sounds. No wheezing, rhonchi or rales.     Comments: Moving air well in all lung fields. No increased work of breathing noted.  Chest:     Chest wall: No tenderness.  Lymphadenopathy:     Cervical: No cervical adenopathy.  Skin:    General: Skin is warm.     Capillary Refill: Capillary refill takes less than 2 seconds.     Coloration: Skin is not pale.     Findings: No abrasion, erythema, petechiae or rash. Rash is not papular, urticarial or vesicular.  Neurological:     Mental Status: He is alert.  Psychiatric:        Behavior: Behavior is cooperative.      Diagnostic studies: none      [name], MD  [facility]

## 2020-03-06 NOTE — Patient Instructions (Addendum)
1. 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  2. Asthma-COPD overlap syndrome - Lung testing looked stable today. - We are going to get labs to see if we can get you back on the [place].  - We will call you with the results.  - Daily controller medication(s): Trelegy 100/62.5/25 one puff once daily - Prior to physical activity: ProAir 2 puffs 10-15 minutes before physical activity. - Rescue medications: ProAir 4 puffs every 4-6 hours as needed or DuoNeb nebulizer one vial every 4-6 hours as needed - Asthma control goals:  * Full participation in all desired activities (may need albuterol before activity) * Albuterol use two time or less a week on average (not counting use with activity) * Cough interfering with sleep two time or less a month * Oral steroids no more than once a year * No hospitalizations   3. Chronic allergic rhinitis (sweet vernal grass, box elder, cat, weeds, ragweed, molds, cockroach, dust mite) - Continue with aszelastine/fluticasone 2 sprays per nostril up to twice daily. - Continue with saline mist 1-2 times daily.  - Continue with cetirizine 10mg  daily as needed for breakthrough symptoms.  4. GERD - Continue famotidine as needed.  5. Return in about 4 months (around 07/07/2020).   Please inform us of any Emergency Department visits, hospitalizations, or changes in symptoms. Call us before going to the ED for breathing or allergy symptoms since we might be able to fit you in for a sick visit. Feel free to contact us anytime with any questions, problems, or concerns.  It was a pleasure to see you again today! Tell Ms. Bonnita Nasuti hello!  Websites that have reliable patient information: 1. American Academy of Asthma, Allergy, and Immunology: www.aaaai.org 2. Food Allergy Research and Education (FARE): foodallergy.org 3. Mothers of Asthmatics:  http://www.asthmacommunitynetwork.org 4. American College of Allergy, Asthma, and Immunology: www.acaai.org   COVID-19 Vaccine Information can be found at: ShippingScam.co.uk For questions related to vaccine distribution or appointments, please email [email or web address] or call [phone].     "Like" [place] on Facebook and Instagram for our latest updates!     HAPPY FALL!     Make sure you are registered to vote! If you have moved or changed any of your contact information, you will need to get this updated before voting!  In some cases, you MAY be able to register to vote online: [email or web address]

## 2020-03-07 MED ORDER — MONTELUKAST SODIUM 10 MG PO TABS
ORAL_TABLET | ORAL | 3 refills | Status: DC
Start: 2020-03-07 — End: 2021-03-11

## 2020-03-07 MED ORDER — AZELASTINE HCL 0.1 % NA SOLN
NASAL | 3 refills | Status: DC
Start: 2020-03-07 — End: 2021-03-18

## 2020-03-07 MED ORDER — ALBUTEROL SULFATE HFA 108 (90 BASE) MCG/ACT IN AERS
INHALATION_SPRAY | RESPIRATORY_TRACT | 1 refills | Status: DC
Start: 2020-03-07 — End: 2020-08-30

## 2020-03-07 MED ORDER — EPINEPHRINE 0.3 MG/0.3ML IJ SOAJ
0.3000 mg | INTRAMUSCULAR | 2 refills | Status: DC | PRN
Start: 2020-03-07 — End: 2021-03-18

## 2020-03-07 MED ORDER — IPRATROPIUM-ALBUTEROL 0.5-2.5 (3) MG/3ML IN SOLN
3.0000 mL | RESPIRATORY_TRACT | 1 refills | Status: DC | PRN
Start: 2020-03-07 — End: 2020-08-30

## 2020-03-07 MED ORDER — TRELEGY ELLIPTA 100-62.5-25 MCG/INH IN AEPB
1.0000 | INHALATION_SPRAY | Freq: Every day | RESPIRATORY_TRACT | 2 refills | Status: DC
Start: 2020-03-07 — End: 2021-03-18

## 2020-03-07 MED ORDER — FLUTICASONE PROPIONATE 50 MCG/ACT NA SUSP
NASAL | 3 refills | Status: DC
Start: 2020-03-07 — End: 2021-04-11

## 2020-03-08 ENCOUNTER — Encounter: Payer: Self-pay | Admitting: Allergy & Immunology

## 2020-03-09 LAB — CBC WITH DIFFERENTIAL/PLATELET
Basophils Absolute: 0.1 10*3/uL (ref 0.0–0.2)
Basos: 1 %
EOS (ABSOLUTE): 0.3 10*3/uL (ref 0.0–0.4)
Eos: 2 %
Hematocrit: 47.8 % (ref 37.5–51.0)
Hemoglobin: 15.6 g/dL (ref 13.0–17.7)
Immature Grans (Abs): 0 10*3/uL (ref 0.0–0.1)
Immature Granulocytes: 0 %
Lymphocytes Absolute: 3 10*3/uL (ref 0.7–3.1)
Lymphs: 26 %
MCH: 28.8 pg (ref 26.6–33.0)
MCHC: 32.6 g/dL (ref 31.5–35.7)
MCV: 88 fL (ref 79–97)
Monocytes Absolute: 1.2 10*3/uL — ABNORMAL HIGH (ref 0.1–0.9)
Monocytes: 10 %
Neutrophils Absolute: 7.1 10*3/uL — ABNORMAL HIGH (ref 1.4–7.0)
Neutrophils: 61 %
Platelets: 261 10*3/uL (ref 150–450)
RBC: 5.42 x10E6/uL (ref 4.14–5.80)
RDW: 13.4 % (ref 11.6–15.4)
WBC: 11.8 10*3/uL — ABNORMAL HIGH (ref 3.4–10.8)

## 2020-03-09 LAB — IGE: IgE (Immunoglobulin E), Serum: 50 IU/mL (ref 6–495)

## 2020-03-12 ENCOUNTER — Other Ambulatory Visit: Payer: Self-pay

## 2020-03-12 ENCOUNTER — Encounter: Payer: Self-pay | Admitting: Physical Medicine and Rehabilitation

## 2020-03-12 ENCOUNTER — Ambulatory Visit (INDEPENDENT_AMBULATORY_CARE_PROVIDER_SITE_OTHER): Payer: BC Managed Care – PPO | Admitting: Physical Medicine and Rehabilitation

## 2020-03-12 ENCOUNTER — Telehealth: Payer: Self-pay | Admitting: *Deleted

## 2020-03-12 ENCOUNTER — Ambulatory Visit: Payer: Self-pay

## 2020-03-12 VITALS — BP 115/81 | HR 75

## 2020-03-12 DIAGNOSIS — Z981 Arthrodesis status: Secondary | ICD-10-CM

## 2020-03-12 DIAGNOSIS — M5416 Radiculopathy, lumbar region: Secondary | ICD-10-CM

## 2020-03-12 DIAGNOSIS — G894 Chronic pain syndrome: Secondary | ICD-10-CM

## 2020-03-12 DIAGNOSIS — M5116 Intervertebral disc disorders with radiculopathy, lumbar region: Secondary | ICD-10-CM

## 2020-03-12 DIAGNOSIS — M5412 Radiculopathy, cervical region: Secondary | ICD-10-CM

## 2020-03-12 DIAGNOSIS — M25561 Pain in right knee: Secondary | ICD-10-CM

## 2020-03-12 DIAGNOSIS — G8929 Other chronic pain: Secondary | ICD-10-CM

## 2020-03-12 DIAGNOSIS — M25562 Pain in left knee: Secondary | ICD-10-CM

## 2020-03-12 MED ORDER — BETAMETHASONE SOD PHOS & ACET 6 (3-3) MG/ML IJ SUSP
12.0000 mg | Freq: Once | INTRAMUSCULAR | Status: AC
  Administered 2020-03-12: 12 mg

## 2020-03-12 NOTE — Progress Notes (Signed)
Pt state lower back pain that travels to his left side. Pt state walking, standing and bending makes the pain worse. Pt state he takes pain meds and use heating pads to ease the pain. Pt has hx of inj on 09/20/19 Pt state it worked pretty good.   Numeric Pain Rating Scale and Functional Assessment Average Pain 4   In the last MONTH (on 0-10 scale) has pain interfered with the following?  1. General activity like being  able to carry out your everyday physical activities such as walking, climbing stairs, carrying groceries, or moving a chair?  Rating(10)   +Driver, -BT, -Dye Allergies.

## 2020-03-12 NOTE — Telephone Encounter (Signed)
-----   Message from [name], MD sent at 03/08/2020 10:20 PM EDT ----- Patient wants to restart Dupixent. He stopped when he was isolated at home, but he is going to be getting out more and wants it "on board" to make sure that his asthma stays well controlled. Last steroid Jan 2021 and AEC 300.

## 2020-03-12 NOTE — Telephone Encounter (Signed)
Called patient and L/m for him advising PA stll approved till 05/15/20 and should contact [facility] for fill to restart. Number provided in message

## 2020-03-16 DIAGNOSIS — J449 Chronic obstructive pulmonary disease, unspecified: Secondary | ICD-10-CM | POA: Diagnosis not present

## 2020-03-16 DIAGNOSIS — J9611 Chronic respiratory failure with hypoxia: Secondary | ICD-10-CM | POA: Diagnosis not present

## 2020-03-20 ENCOUNTER — Encounter: Payer: Self-pay | Admitting: Physical Medicine and Rehabilitation

## 2020-03-20 DIAGNOSIS — J449 Chronic obstructive pulmonary disease, unspecified: Secondary | ICD-10-CM | POA: Diagnosis not present

## 2020-03-20 DIAGNOSIS — G4733 Obstructive sleep apnea (adult) (pediatric): Secondary | ICD-10-CM | POA: Diagnosis not present

## 2020-03-20 DIAGNOSIS — R069 Unspecified abnormalities of breathing: Secondary | ICD-10-CM | POA: Diagnosis not present

## 2020-03-20 NOTE — Progress Notes (Signed)
[name] - 59 y.o. male MRN [id number]  Date of birth: [birth year]  Office Visit Note: Visit Date: 03/12/2020 PCP: Mark Pinto, MD Referred by: Mark Pinto, MD  Subjective: Chief Complaint  Patient presents with  . Lower Back - Pain   HPI: Mark Harrell is a 59 y.o. male who comes in today For planned repeat lumbar interlaminar epidural steroid injection as well as evaluation and management of chronic worsening severe neck pain with bilateral referral into the shoulders and arms as well as bilateral knee pain.  In terms of his lumbar spine he is followed by Dr. Basil Harrell and in point of fact is followed by Dr. Basil Harrell for cervical spine as well.  The last time I saw the patient for cervical spine issues was in 2018 we completed cervical epidural injection for cervical stenosis.  He went on to have ACDF performed after that by Dr. Louanne Harrell.  He has had ongoing neck pain and referral into the shoulder since that time.  This is despite medication management.  Has been a longtime patient at [facility] pain management.  He used to see Dr. Park Harrell who is since retired.  He is still seeing someone there at [facility] for pain management.  Dr. Louanne Harrell has referred him in the past at least in July to pain management as well.  Patient reports that he saw a recent video teleconference that I have [employer] looking at advanced pain management approaches.  He is curious if cervical spinal cord stimulator trial would be appropriate for him.  He has no history of being evaluated for this in the past.  He has been intolerant of medications and still has continued pain.  His case is complicated by morbid obesity.  He is a fairly tall individual.  I discussed with him at length that the for greater than 30 minutes alone just on spinal cord stimulation and how it works and what is involved with the trial in getting this approved.  My recommendation at this point is to refer him to the  [facility] with either Dr. Mechele Harrell or Dr. Darral Harrell.  He will continue to follow-up with Dr. Louanne Harrell and I think Dr. Merrilee Harrell would be fine with that placement of the referral since he has referred him to pain management in the past.  In terms of his lumbar spine he got more than 50% relief with the injection performed in April.  He did quite well up until recently with no new injuries or falls.  No red flag complaints.  We will repeat the injection today of his lumbar spine.  He also asked me today about his knees.  He is having pain at the joint line bilaterally.  No specific trauma or new injury.  Chronic condition with his knees.  At this point I told him after brief examination that I did not see anything acutely wrong but that he could follow-up with either Mark Harrell who he is seen in the past or Dr. Louanne Harrell who still would be able to treat his knees.  Review of Systems  Musculoskeletal: Positive for back pain, joint pain and neck pain.  All other systems reviewed and are negative.  Otherwise per HPI.  Assessment & Plan: Visit Diagnoses:  1. Lumbar radiculopathy   2. Radiculopathy due to lumbar intervertebral disc disorder   3. Radiculopathy, cervical region   4. S/P cervical spinal fusion   5. Chronic pain syndrome   6. Chronic  pain of left knee   7. Chronic pain of right knee     Plan: Findings:  1.  Chronic worsening severe low back pain with some radicular pain on the left worse with standing and walking consistent with MRI findings and he had good relief with prior epidural injection.  He has had physical therapy and activity modification etc.  We will repeat the injection today.  Patient can have intermittent injections performed if they are beneficial and are lasting several months at a time.  We will continue to follow along with Dr. Louanne Harrell.  2.  In terms of his neck pain is fairly complicated at this point with history of ACDF prior epidural injections before that CT scan  showing some adjacent level disease but no high-grade central stenosis.  Referral will be made to Mark Harrell for evaluation for looking at possible spinal cord stimulator trial.  Patient will continue to follow with Dr. Louanne Harrell who can make recommendations otherwise.  3.  Bilateral chronic knee pain likely osteoarthritic changes do to patient's age and body habitus.  No x-rays performed today these have been looked at in the past through Mark Harrell and Dr. Louanne Harrell.  He can follow-up with them I think for better orthopedic care.    Meds & Orders:  Meds ordered this encounter  Medications  . betamethasone acetate-betamethasone sodium phosphate (CELESTONE) injection 12 mg    Orders Placed This Encounter  Procedures  . XR C-ARM NO REPORT  . Ambulatory referral to Pain Clinic  . Epidural Steroid injection    Follow-up: No follow-ups on file.   Procedures: No procedures performed  Lumbar Epidural Steroid Injection - Interlaminar Approach with Fluoroscopic Guidance  Patient: Mark Harrell      Date of Birth: 04/08/1961 MRN: [id number] PCP: Mark Pinto, MD      Visit Date: 03/12/2020   Universal Protocol:     Consent Given By: the patient  Position: PRONE  Additional Comments: Vital signs were monitored before and after the procedure. Patient was prepped and draped in the usual sterile fashion. The correct patient, procedure, and site was verified.   Injection Procedure Details:   Meds Administered:  Meds ordered this encounter  Medications  . betamethasone acetate-betamethasone sodium phosphate (CELESTONE) injection 12 mg     Laterality: Left  Location/Site:  L5-S1  Needle size: 20 G  Needle type: Tuohy  Needle Placement: Paramedian epidural  Findings:   -Comments: Excellent flow of contrast into the epidural space.  Procedure Details: Using a paramedian approach from the side mentioned above, the region overlying the inferior lamina was localized  under fluoroscopic visualization and the soft tissues overlying this structure were infiltrated with 4 ml. of 1% Lidocaine without Epinephrine. The Tuohy needle was inserted into the epidural space using a paramedian approach.   The epidural space was localized using loss of resistance along with counter oblique bi-planar fluoroscopic views.  After negative aspirate for air, blood, and CSF, a 2 ml. volume of Isovue-250 was injected into the epidural space and the flow of contrast was observed. Radiographs were obtained for documentation purposes.    The injectate was administered into the level noted above.   Additional Comments:  The patient tolerated the procedure well Dressing: 2 x 2 sterile gauze and Band-Aid    Post-procedure details: Patient was observed during the procedure. Post-procedure instructions were reviewed.  Patient left the clinic in stable condition.    Clinical History: CT CERVICAL SPINE WITHOUT CONTRAST  TECHNIQUE:  Multidetector CT imaging of the cervical spine was performed without intravenous contrast. Multiplanar CT image reconstructions were also generated.  COMPARISON:  Cervical MRI 03/06/2019  FINDINGS: Alignment: Normal.  Skull base and vertebrae: C5-6 and C6-7 ACDF with solid arthrodesis. The ventral plate is in good position. No evidence of fracture or bone lesion  Soft tissues and spinal canal: No prevertebral fluid or swelling. No visible canal hematoma.  Disc levels:  C2-3: Asymmetric right uncovertebral and endplate ridging with mild to moderate right foraminal narrowing  C3-4: Disc narrowing with asymmetric right uncovertebral ridging and moderate right foraminal impingement  C4-5: Ventral spondylitic spurring.  No evidence of impingement  C5-6: ACDF with solid arthrodesis.  C6-7: ACDF with solid arthrodesis. Notable uncovertebral spurs with moderate bilateral residual narrowing.  C7-T1:Narrow disc with mild left  foraminal narrowing  Upper chest: Negative  IMPRESSION: 1. C5-C7 ACDF with solid arthrodesis. 2. Mild-to-moderate right foraminal narrowing at C2-3 and C3-4 from disc height loss and uncovertebral ridging.   Electronically Signed   By: Monte Fantasia M.D.   On: 11/01/2019 10:47 ----  MRI LUMBAR SPINE WITHOUT CONTRAST    TECHNIQUE:  Multiplanar, multisequence MR imaging of the lumbar spine was  performed. No intravenous contrast was administered.    COMPARISON: Lumbar MRI 04/11/2018. CT lumbar spine 03/26/2018.    FINDINGS:  Segmentation: Normal as seen by CT, the same numbering system used  on the prior MRI.    Alignment: Stable lumbar lordosis.    Vertebrae: Minimal degenerative appearing marrow edema at the  anterior L5-S1 endplates. Normal background bone marrow signal. No  other acute osseous abnormality identified. Intact visible sacrum  and SI joints.    Conus medullaris and cauda equina: Conus extends to the T12 level.  No lower spinal cord or conus signal abnormality.    Paraspinal and other soft tissues: Partially visible sigmoid  diverticulosis. Otherwise negative Visualized abdominal viscera and  paraspinal soft tissues.    Disc levels:    T11-T12: Mild disc bulging appears stable.    T12-L1: Negative.    L1-L2: Anterior disc bulging and endplate spurring. No stenosis.    L2-L3: Borderline to mild spinal stenosis mostly related to  posterior element hypertrophy and mild epidural lipomatosis, stable.    L3-L4: Far lateral disc bulging and mild posterior element  hypertrophy. Borderline to mild right L3 foraminal stenosis is  stable.    L4-L5: Far lateral disc bulging and mild to moderate posterior  element hypertrophy. No stenosis.    L5-S1: Chronic circumferential disc bulging with superimposed small  central disc protrusion (series 6, image 38). Mild posterior element  hypertrophy. Mild involvement of the lateral  recesses (S1 nerve  levels) without spinal stenosis. Stable borderline to mild L5  foraminal stenosis.    IMPRESSION:  1. Stable MRI appearance of the lumbar spine since 2019.  2. L5-S1 disc bulge and small central disc protrusion resulting in  up to mild lateral recess and foraminal stenosis.  3. Far lateral disc bulging elsewhere with superimposed posterior  element hypertrophy. Up to mild spinal stenosis at L2-L3 and right  foraminal stenosis at L3-L4.      Electronically Signed  By: Genevie Ann M.D.  On: 03/07/2019 01:41   He reports that he has quit smoking. He has a 1.50 pack-year smoking history. He has never used smokeless tobacco.  Recent Labs    05/26/19 1658 07/07/19 1524 01/10/20 1507  HGBA1C 4.8 5.4 5.3    Objective:  VS:  HT:  WT:   BMI:     BP:115/81  HR:75bpm  TEMP: ( )  RESP:  Physical Exam Vitals and nursing note reviewed.  Constitutional:      General: He is not in acute distress.    Appearance: Normal appearance. He is obese. He is not ill-appearing.  HENT:     Head: Normocephalic and atraumatic.     Right Ear: External ear normal.     Left Ear: External ear normal.  Eyes:     Extraocular Movements: Extraocular movements intact.  Cardiovascular:     Rate and Rhythm: Normal rate.     Pulses: Normal pulses.  Abdominal:     General: There is no distension.     Palpations: Abdomen is soft.  Musculoskeletal:        General: No signs of injury.     Cervical back: Neck supple. Tenderness present. No rigidity.     Right lower leg: No edema.     Left lower leg: No edema.     Comments: Patient has good strength in the upper extremities with 5 out of 5 strength in wrist extension long finger flexion APB.  No intrinsic hand muscle atrophy.  Negative Hoffmann's test.  Patient has well-healed anterior cervical discectomy scar.  He does have forward flex cervical spine.  No active trigger points noted but he does have some trigger points in the levator  scapula and trapezius bilaterally.  In terms of his lower back he has pain with extension and facet loading.  He has no real focal trigger points in the lower back.  He has no pain over the greater trochanter.  He has some pain over the left PSIS.  He has a negative slump test he has good distal strength.  Brief examination of both knees show good varus and valgus stability with mild joint line tenderness on the right more than left.  Lymphadenopathy:     Cervical: No cervical adenopathy.  Skin:    Findings: No erythema or rash.  Neurological:     General: No focal deficit present.     Mental Status: He is alert and oriented to person, place, and time.     Sensory: No sensory deficit.     Motor: No weakness or abnormal muscle tone.     Coordination: Coordination normal.  Psychiatric:        Mood and Affect: Mood normal.        Behavior: Behavior normal.     Ortho Exam  Imaging: No results found.  Past Medical/Family/Surgical/Social History: Medications & Allergies reviewed per EMR, new medications updated. Patient Active Problem List   Diagnosis Date Noted  . Tracheomalacia 05/26/2019  . Acquired tracheomalacia 05/26/2019  . HNP (herniated nucleus pulposus) with myelopathy, cervical 05/23/2019    Class: Chronic  . Spinal stenosis of cervical region 05/23/2019    Class: Chronic  . Status post cervical spinal fusion 05/23/2019  . Chronic respiratory failure with hypoxia ([place]) 04/12/2018  . Hymenoptera allergy 11/10/2017  . Diverticulosis 08/07/2017  . Family history of colonic polyps 08/07/2017  . Myofascial pain 08/06/2017  . Seasonal and perennial allergic rhinitis 04/23/2017  . 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 Cervicalgia 04/01/2017  . Cervical spondylosis with radiculopathy 02/09/2017  . Memory difficulty 12/05/2016  . Morbid obesity ([place]) 12/05/2016  . Migraine without aura and without status migrainosus, not intractable 10/30/2016  . Lumbar radiculopathy 05/15/2016    . Osteoarthritis of spine with radiculopathy, lumbar region 05/15/2016  . Risk for falls  05/15/2016  . Recurrent infections 03/29/2016  . Chronic nonseasonal allergic rhinitis due to pollen 03/29/2016  . Polypharmacy 01/16/2016  . Morbid obesity with BMI of 50.0-59.9, adult ([place]) 03/06/2015  . SDAT 02/05/2015  . OSA and COPD overlap syndrome ([place]) 02/05/2015  . Medication management 08/02/2014  . GERD (gastroesophageal reflux disease) 05/09/2014  . Vitamin D deficiency 08/01/2013  . Prediabetes 08/01/2013  . 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 Diverticula of colon 05/07/2011  . Hypertension 01/31/2011  . Hyperlipidemia, mixed 01/31/2011  . BPH (benign prostatic hyperplasia) 01/31/2011  . Testosterone Deficiency 01/31/2011  . IBS (irritable bowel syndrome) 01/31/2011  . Partial complex seizure disorder with intractable epilepsy ([place]) 01/31/2011  . Depression, major, recurrent, in partial remission ([place]) 01/31/2011  . Asthma-COPD overlap syndrome ([place]) 01/31/2011   Past Medical History:  Diagnosis Date  . Anesthesia complication requiring reversal agent administration    ? from central apnea, very difficult to get off vent  . Anxiety   . Arthritis    osteo  . Asthma   . BPH (benign prostatic hyperplasia)   . Complication of anesthesia    difficulty waking , they twlight me because of my respiratory problems "  . Depression   . Dyspnea    on exertion  . Enlarged heart   . Family history of adverse reaction to anesthesia    mother trouble waking up, and heart stopped  . GERD (gastroesophageal reflux disease)   . Headache    botox injections for headaches  . Hyperlipidemia   . Hypertension   . Hypogonadism male   . IBS (irritable bowel syndrome)   . Memory difficulties    short term memories  . Obesity   . On home oxygen therapy    on 2 liter  . OSA (obstructive sleep apnea)    not using cpap  . Pneumonia   . Pre-diabetes   . Prostatitis    Family History  Problem  Relation Age of Onset  . Diabetes Paternal Uncle   . Cancer Father        lymphoma, colon  . Diabetes Maternal Grandmother   . Heart disease Maternal Grandfather   . Diabetes Maternal Grandfather   . Diabetes Paternal Grandmother   . Diabetes Paternal Grandfather   . Dementia Mother   . Prostate cancer Maternal Uncle   . Lung disease Neg Hx   . Rheumatologic disease Neg Hx    Past Surgical History:  Procedure Laterality Date  . ABDOMINAL SURGERY    . ANKLE FRACTURE SURGERY Right   . ANTERIOR CERVICAL DECOMP/DISCECTOMY FUSION N/A 05/23/2019   Procedure: ANTERIOR CERVICAL DISCECTOMY FUSION CERVICAL FIVE THROUGH CERVICAL SIX AND CERVICAL SIX THROUGH CERVICAL SEVEN;  Surgeon: Jessy Oto, MD;  Location: [facility];  Service: Orthopedics;  Laterality: N/A;  . COLONOSCOPY    . CYSTOSCOPY     [place]  . KNEE ARTHROSCOPY WITH MEDIAL MENISECTOMY Left 01/02/2017   Procedure: LEFT KNEE ARTHROSCOPY WITH PARTIAL MEDIAL MENISCECTOMY;  Surgeon: Mcarthur Rossetti, MD;  Location: [facility];  Service: Orthopedics;  Laterality: Left;  . TONSILLECTOMY    . [place] RESECTION  2007  . UVULOPALATOPHARYNGOPLASTY     Social History   Occupational History  . Occupation: Dance movement psychotherapist  Tobacco Use  . Smoking status: Former Smoker    Packs/day: 0.10    Years: 15.00    Pack years: 1.50  . Smokeless tobacco: Never Used  . Tobacco comment: significant second-hand exposure through mother  Vaping Use  .  Vaping Use: Never used  Substance and Sexual Activity  . Alcohol use: Yes    Alcohol/week: 1.0 standard drink    Types: 1 Glasses of wine per week    Comment: 2 x a year  . Drug use: No  . Sexual activity: Never

## 2020-03-20 NOTE — Procedures (Signed)
Lumbar Epidural Steroid Injection - Interlaminar Approach with Fluoroscopic Guidance  Patient: Mark Harrell      Date of Birth: 04/13/1961 MRN: [id number] PCP: [name], MD      Visit Date: 03/12/2020   Universal Protocol:     Consent Given By: the patient  Position: PRONE  Additional Comments: Vital signs were monitored before and after the procedure. Patient was prepped and draped in the usual sterile fashion. The correct patient, procedure, and site was verified.   Injection Procedure Details:   Meds Administered:  Meds ordered this encounter  Medications  . betamethasone acetate-betamethasone sodium phosphate (CELESTONE) injection 12 mg     Laterality: Left  Location/Site:  L5-S1  Needle size: 20 G  Needle type: Tuohy  Needle Placement: Paramedian epidural  Findings:   -Comments: Excellent flow of contrast into the epidural space.  Procedure Details: Using a paramedian approach from the side mentioned above, the region overlying the inferior lamina was localized under fluoroscopic visualization and the soft tissues overlying this structure were infiltrated with 4 ml. of 1% Lidocaine without Epinephrine. The Tuohy needle was inserted into the epidural space using a paramedian approach.   The epidural space was localized using loss of resistance along with counter oblique bi-planar fluoroscopic views.  After negative aspirate for air, blood, and CSF, a 2 ml. volume of Isovue-250 was injected into the epidural space and the flow of contrast was observed. Radiographs were obtained for documentation purposes.    The injectate was administered into the level noted above.   Additional Comments:  The patient tolerated the procedure well Dressing: 2 x 2 sterile gauze and Band-Aid    Post-procedure details: Patient was observed during the procedure. Post-procedure instructions were reviewed.  Patient left the clinic in stable condition.

## 2020-03-25 ENCOUNTER — Other Ambulatory Visit: Payer: Self-pay | Admitting: Allergy & Immunology

## 2020-03-26 ENCOUNTER — Ambulatory Visit: Payer: BC Managed Care – PPO | Admitting: Specialist

## 2020-03-26 ENCOUNTER — Encounter: Payer: Self-pay | Admitting: Specialist

## 2020-03-26 ENCOUNTER — Other Ambulatory Visit: Payer: Self-pay | Admitting: Internal Medicine

## 2020-03-26 ENCOUNTER — Other Ambulatory Visit: Payer: Self-pay

## 2020-03-26 VITALS — BP 115/81 | HR 75 | Ht 72.0 in | Wt 386.0 lb

## 2020-03-26 DIAGNOSIS — M4819 Ankylosing hyperostosis [Forestier], multiple sites in spine: Secondary | ICD-10-CM | POA: Diagnosis not present

## 2020-03-26 DIAGNOSIS — M4722 Other spondylosis with radiculopathy, cervical region: Secondary | ICD-10-CM | POA: Diagnosis not present

## 2020-03-26 DIAGNOSIS — Z981 Arthrodesis status: Secondary | ICD-10-CM | POA: Diagnosis not present

## 2020-03-26 DIAGNOSIS — M47816 Spondylosis without myelopathy or radiculopathy, lumbar region: Secondary | ICD-10-CM | POA: Diagnosis not present

## 2020-03-26 DIAGNOSIS — G43709 Chronic migraine without aura, not intractable, without status migrainosus: Secondary | ICD-10-CM | POA: Diagnosis not present

## 2020-03-26 NOTE — Progress Notes (Signed)
Office Visit Note   Patient: Mark Harrell           Date of Birth: 10-21-1960           MRN: [id number] Visit Date: 03/26/2020              Requested by: [name], [address] [place],  [place] [address] PCP: [name], MD   Assessment & Plan: Visit Diagnoses:  1. Other spondylosis with radiculopathy, cervical region   2. Forestier's disease of multiple sites   3. Spondylosis without myelopathy or radiculopathy, lumbar region   4. Status post cervical spinal fusion     Plan: Avoid overhead lifting and overhead use of the arms. Do not lift greater than 5 lbs. Adjust head rest in vehicle to prevent hyperextension if rear ended. Take extra precautions to avoid falling. Sees a nephrologist so reluctant to use NSAIDs, able to use gel but not oral diclofenac. MRI of the cervical spine. Post decompression for stenosis with residual spondylosis at bilateral C6-7 foramen. Have RFA of the lumbar spine for spondylosis of the lumbar spine. Follow-Up Instructions: Return in about 4 weeks (around 04/23/2020).   Orders:  No orders of the defined types were placed in this encounter.  No orders of the defined types were placed in this encounter.     Procedures: No procedures performed   Clinical Data: No additional findings.   Subjective: Chief Complaint  Patient presents with  . Neck - Follow-up    CT Scan review of CSP    HPI  Review of Systems   Objective: Vital Signs: BP 115/81 (BP Location: Left Arm, Patient Position: Sitting)   Pulse 75   Ht 6' (1.829 m)   Wt (!) 386 lb (175.1 kg)   BMI 52.35 kg/m   Physical Exam  Ortho Exam  Specialty Comments:  No specialty comments available.  Imaging: No results found.   PMFS History: Patient Active Problem List   Diagnosis Date Noted  . HNP (herniated nucleus pulposus) with myelopathy, cervical 05/23/2019    Priority: High    Class: Chronic  . Spinal stenosis of cervical  region 05/23/2019    Priority: High    Class: Chronic  . Tracheomalacia 05/26/2019  . Acquired tracheomalacia 05/26/2019  . Status post cervical spinal fusion 05/23/2019  . Chronic respiratory failure with hypoxia ([place]) 04/12/2018  . Hymenoptera allergy 11/10/2017  . Diverticulosis 08/07/2017  . Family history of colonic polyps 08/07/2017  . Myofascial pain 08/06/2017  . Seasonal and perennial allergic rhinitis 04/23/2017  . 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 Cervicalgia 04/01/2017  . Cervical spondylosis with radiculopathy 02/09/2017  . Memory difficulty 12/05/2016  . Morbid obesity ([place]) 12/05/2016  . Migraine without aura and without status migrainosus, not intractable 10/30/2016  . Lumbar radiculopathy 05/15/2016  . Osteoarthritis of spine with radiculopathy, lumbar region 05/15/2016  . Risk for falls 05/15/2016  . Recurrent infections 03/29/2016  . Chronic nonseasonal allergic rhinitis due to pollen 03/29/2016  . Polypharmacy 01/16/2016  . Morbid obesity with BMI of 50.0-59.9, adult ([place]) 03/06/2015  . SDAT 02/05/2015  . OSA and COPD overlap syndrome ([place]) 02/05/2015  . Medication management 08/02/2014  . GERD (gastroesophageal reflux disease) 05/09/2014  . Vitamin D deficiency 08/01/2013  . Prediabetes 08/01/2013  . 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 Diverticula of colon 05/07/2011  . Hypertension 01/31/2011  . Hyperlipidemia, mixed 01/31/2011  . BPH (benign prostatic hyperplasia) 01/31/2011  . Testosterone Deficiency 01/31/2011  . IBS (  irritable bowel syndrome) 01/31/2011  . Partial complex seizure disorder with intractable epilepsy ([place]) 01/31/2011  . Depression, major, recurrent, in partial remission ([place]) 01/31/2011  . Asthma-COPD overlap syndrome ([place]) 01/31/2011   Past Medical History:  Diagnosis Date  . Anesthesia complication requiring reversal agent administration    ? from central apnea, very difficult to get off vent  . Anxiety   . Arthritis    osteo  .  Asthma   . BPH (benign prostatic hyperplasia)   . Complication of anesthesia    difficulty waking , they twlight me because of my respiratory problems "  . Depression   . Dyspnea    on exertion  . Enlarged heart   . Family history of adverse reaction to anesthesia    mother trouble waking up, and heart stopped  . GERD (gastroesophageal reflux disease)   . Headache    botox injections for headaches  . Hyperlipidemia   . Hypertension   . Hypogonadism male   . IBS (irritable bowel syndrome)   . Memory difficulties    short term memories  . Obesity   . On home oxygen therapy    on 2 liter  . OSA (obstructive sleep apnea)    not using cpap  . Pneumonia   . Pre-diabetes   . Prostatitis     Family History  Problem Relation Age of Onset  . Diabetes Paternal Uncle   . Cancer Father        lymphoma, colon  . Diabetes Maternal Grandmother   . Heart disease Maternal Grandfather   . Diabetes Maternal Grandfather   . Diabetes Paternal Grandmother   . Diabetes Paternal Grandfather   . Dementia Mother   . Prostate cancer Maternal Uncle   . Lung disease Neg Hx   . Rheumatologic disease Neg Hx     Past Surgical History:  Procedure Laterality Date  . ABDOMINAL SURGERY    . ANKLE FRACTURE SURGERY Right   . ANTERIOR CERVICAL DECOMP/DISCECTOMY FUSION N/A 05/23/2019   Procedure: ANTERIOR CERVICAL DISCECTOMY FUSION CERVICAL FIVE THROUGH CERVICAL SIX AND CERVICAL SIX THROUGH CERVICAL SEVEN;  Surgeon: Jessy Oto, MD;  Location: [facility];  Service: Orthopedics;  Laterality: N/A;  . COLONOSCOPY    . CYSTOSCOPY     [place]  . KNEE ARTHROSCOPY WITH MEDIAL MENISECTOMY Left 01/02/2017   Procedure: LEFT KNEE ARTHROSCOPY WITH PARTIAL MEDIAL MENISCECTOMY;  Surgeon: Mcarthur Rossetti, MD;  Location: [facility];  Service: Orthopedics;  Laterality: Left;  . TONSILLECTOMY    . [place] RESECTION  2007  . UVULOPALATOPHARYNGOPLASTY     Social History   Occupational History  . Occupation:  Dance movement psychotherapist  Tobacco Use  . Smoking status: Former Smoker    Packs/day: 0.10    Years: 15.00    Pack years: 1.50  . Smokeless tobacco: Never Used  . Tobacco comment: significant second-hand exposure through mother  Vaping Use  . Vaping Use: Never used  Substance and Sexual Activity  . Alcohol use: Yes    Alcohol/week: 1.0 standard drink    Types: 1 Glasses of wine per week    Comment: 2 x a year  . Drug use: No  . Sexual activity: Never

## 2020-03-26 NOTE — Patient Instructions (Signed)
Plan: Avoid overhead lifting and overhead use of the arms. Do not lift greater than 5 lbs. Adjust head rest in vehicle to prevent hyperextension if rear ended. Take extra precautions to avoid falling. Sees a nephrologist so reluctant to use NSAIDs, able to use gel but not oral diclofenac. MRI of the cervical spine. Post decompression for stenosis with residual spondylosis at bilateral C6-7 foramen. Have RFA of the lumbar spine for spondylosis of the lumbar spine. Follow-Up Instructions: Return in about 4 weeks (around 04/23/2020).

## 2020-04-05 ENCOUNTER — Other Ambulatory Visit: Payer: Self-pay

## 2020-04-05 ENCOUNTER — Ambulatory Visit: Payer: BC Managed Care – PPO | Admitting: Podiatry

## 2020-04-05 ENCOUNTER — Encounter: Payer: Self-pay | Admitting: Podiatry

## 2020-04-05 DIAGNOSIS — M79676 Pain in unspecified toe(s): Secondary | ICD-10-CM

## 2020-04-05 DIAGNOSIS — B351 Tinea unguium: Secondary | ICD-10-CM

## 2020-04-05 DIAGNOSIS — M722 Plantar fascial fibromatosis: Secondary | ICD-10-CM | POA: Diagnosis not present

## 2020-04-05 DIAGNOSIS — Q828 Other specified congenital malformations of skin: Secondary | ICD-10-CM | POA: Diagnosis not present

## 2020-04-07 NOTE — Progress Notes (Signed)
He presents today for follow-up painful toenails and bilateral arches.  Objective: Vital signs are stable he is alert and oriented x3 pulses are strong and palpable.  Pain on palpation medial calcaneal tubercles bilateral.  Also toenails are long thick yellow dystrophic-like mycotic and painful.  Assessment: Plantar fasciitis pain in limb secondary to onychomycosis.  Plan: I injected the bilateral heels today 20 mg Kenalog 5 mg Marcaine debrided toenails 1 through 5 bilaterally follow-up with him in 3 months

## 2020-04-11 ENCOUNTER — Ambulatory Visit: Payer: BC Managed Care – PPO | Admitting: Adult Health

## 2020-04-16 DIAGNOSIS — J9611 Chronic respiratory failure with hypoxia: Secondary | ICD-10-CM | POA: Diagnosis not present

## 2020-04-16 DIAGNOSIS — J449 Chronic obstructive pulmonary disease, unspecified: Secondary | ICD-10-CM | POA: Diagnosis not present

## 2020-04-17 ENCOUNTER — Other Ambulatory Visit: Payer: Self-pay | Admitting: Internal Medicine

## 2020-04-17 DIAGNOSIS — I129 Hypertensive chronic kidney disease with stage 1 through stage 4 chronic kidney disease, or unspecified chronic kidney disease: Secondary | ICD-10-CM | POA: Diagnosis not present

## 2020-04-17 DIAGNOSIS — R809 Proteinuria, unspecified: Secondary | ICD-10-CM | POA: Diagnosis not present

## 2020-04-17 DIAGNOSIS — E876 Hypokalemia: Secondary | ICD-10-CM

## 2020-04-17 DIAGNOSIS — R609 Edema, unspecified: Secondary | ICD-10-CM | POA: Diagnosis not present

## 2020-04-18 ENCOUNTER — Ambulatory Visit
Admission: RE | Admit: 2020-04-18 | Discharge: 2020-04-18 | Disposition: A | Discharge status: Home / Self Care | Payer: BC Managed Care – PPO | Source: Ambulatory Visit | Attending: Specialist | Admitting: Specialist

## 2020-04-18 ENCOUNTER — Other Ambulatory Visit: Payer: Self-pay

## 2020-04-18 DIAGNOSIS — M4722 Other spondylosis with radiculopathy, cervical region: Secondary | ICD-10-CM

## 2020-04-18 DIAGNOSIS — M47816 Spondylosis without myelopathy or radiculopathy, lumbar region: Secondary | ICD-10-CM

## 2020-04-18 DIAGNOSIS — M4802 Spinal stenosis, cervical region: Secondary | ICD-10-CM | POA: Diagnosis not present

## 2020-04-20 DIAGNOSIS — G4733 Obstructive sleep apnea (adult) (pediatric): Secondary | ICD-10-CM | POA: Diagnosis not present

## 2020-04-20 DIAGNOSIS — J449 Chronic obstructive pulmonary disease, unspecified: Secondary | ICD-10-CM | POA: Diagnosis not present

## 2020-04-20 DIAGNOSIS — R069 Unspecified abnormalities of breathing: Secondary | ICD-10-CM | POA: Diagnosis not present

## 2020-04-26 ENCOUNTER — Ambulatory Visit: Payer: BC Managed Care – PPO | Admitting: Specialist

## 2020-04-26 ENCOUNTER — Other Ambulatory Visit: Payer: Self-pay

## 2020-04-26 ENCOUNTER — Encounter: Payer: Self-pay | Admitting: Specialist

## 2020-04-26 VITALS — BP 136/84 | HR 77 | Ht 72.0 in | Wt 386.0 lb

## 2020-04-26 DIAGNOSIS — M5010 Cervical disc disorder with radiculopathy, unspecified cervical region: Secondary | ICD-10-CM

## 2020-04-26 DIAGNOSIS — Z981 Arthrodesis status: Secondary | ICD-10-CM

## 2020-04-26 DIAGNOSIS — M4722 Other spondylosis with radiculopathy, cervical region: Secondary | ICD-10-CM

## 2020-04-26 NOTE — Patient Instructions (Signed)
Avoid overhead lifting and overhead use of the arms. Do not lift greater than 5 lbs. Adjust head rest in vehicle to prevent hyperextension if rear ended. Take extra precautions to avoid falling, including use of a cane if you feel weak. Scheduling secretary Kandice Hams. will call you to arrange for surgery for your cervical spine. If you wish a second opinion please let us know and we can arrange for you. If you have worsening arm or leg numbness or weakness please call or go to an ER. We will contact your cardiologist and primary care physicians to seek clearance for your surgery. Surgery will be an posterior left C6-7 and C7-T1 foraminotomies with decompression of the left C7 and C8 foramen and excision of the disc herniation left C7-T1  with use of the operating microscope.  Risks of surgery include risks of infection, bleeding and risks to the spinal cord and  Improve over the next 4-6 weeks following surgery. Surgery is indicated due to upper extremity radiculopathy, lermittes phenomena and falls. In the future surgery at adjacent levels may be necessary but these levels do not appear to be related to your current symptoms or signs.

## 2020-04-26 NOTE — Progress Notes (Addendum)
Office Visit Note   Patient: Mark Harrell           Date of Birth: 05-29-1960           MRN: [id number] Visit Date: 04/26/2020              Requested by: [name], [address] [place],   [place] [id number] PCP: [name], MD   Assessment & Plan: Visit Diagnoses:  1. Other spondylosis with radiculopathy, cervical region   2. Herniation of cervical intervertebral disc with radiculopathy   3. Status post cervical spinal fusion     Plan: Avoid overhead lifting and overhead use of the arms. Do not lift greater than 5 lbs. Adjust head rest in vehicle to prevent hyperextension if rear ended. Take extra precautions to avoid falling, including use of a cane if you feel weak. Scheduling secretary Kandice Hams. will call you to arrange for surgery for your cervical spine. If you wish a second opinion please let us know and we can arrange for you. If you have worsening arm or leg numbness or weakness please call or go to an ER. We will contact your cardiologist and primary care physicians to seek clearance for your surgery. Surgery will be an posterior left C6-7 and C7-T1 foraminotomies with decompression of the left C7 and C8 foramen and excision of the disc herniation left C7-T1  with use of the operating microscope.  Risks of surgery include risks of infection, bleeding and risks to the spinal cord and  Improve over the next 4-6 weeks following surgery. Surgery is indicated due to upper extremity radiculopathy, lermittes phenomena and falls. In the future surgery at adjacent levels may be necessary but these levels do not appear to be related to your current symptoms or signs.  Follow-Up Instructions: Return in about 4 weeks (around 05/24/2020).   Orders:  No orders of the defined types were placed in this encounter.  No orders of the defined types were placed in this encounter.     Procedures: No procedures performed   Clinical Data: No  additional findings.   Subjective: Chief Complaint  Patient presents with  . Neck - Follow-up    MRI Review    59 year old amydextrous male with history of C5-6 and C6-7 decompression with fusion. He is experiencing mainly pain into the left arm with left arm weakness. He is dropping items and has weak grip. His standing and walking is impaired. Unable to lie flat to sleep and he  Is having pain with bending and stooping. He has migraine history and has been experiencing pain from his neck into his scalp. Feels like he Is clumbsy in both hands. He is experiencing urinary difficulties " about everything, overactive bladder, pain and incontinence and flow issues. Sees Dr. Lovena Neighbours for urologic concerns, the urologist is not sure, head and back injury related possibly and decreased libido.     Review of Systems  Constitutional: Negative.   HENT: Negative.   Eyes: Negative.   Respiratory: Negative.   Cardiovascular: Negative.   Gastrointestinal: Negative.   Endocrine: Negative.   Genitourinary: Negative.   Musculoskeletal: Negative.   Skin: Negative.   Allergic/Immunologic: Negative.   Neurological: Negative.   Hematological: Negative.   Psychiatric/Behavioral: Negative.      Objective: Vital Signs: BP 136/84 (BP Location: Left Arm, Patient Position: Sitting)   Pulse 77   Ht 6' (1.829 m)   Wt (!) 386 lb (175.1 kg)   BMI  52.35 kg/m   Physical Exam Constitutional:      Appearance: He is well-developed.  HENT:     Head: Normocephalic and atraumatic.  Eyes:     Pupils: Pupils are equal, round, and reactive to light.  Pulmonary:     Effort: Pulmonary effort is normal.     Breath sounds: Normal breath sounds.  Abdominal:     General: Bowel sounds are normal.     Palpations: Abdomen is soft.  Musculoskeletal:        General: Normal range of motion.     Cervical back: Normal range of motion and neck supple.  Skin:    General: Skin is warm and dry.  Neurological:      Mental Status: He is alert and oriented to person, place, and time.  Psychiatric:        Behavior: Behavior normal.        Thought Content: Thought content normal.        Judgment: Judgment normal.     Ortho Exam  Specialty Comments:  No specialty comments available.  Imaging: No results found.   PMFS History: Patient Active Problem List   Diagnosis Date Noted  . HNP (herniated nucleus pulposus) with myelopathy, cervical 05/23/2019    Priority: High    Class: Chronic  . Spinal stenosis of cervical region 05/23/2019    Priority: High    Class: Chronic  . Tracheomalacia 05/26/2019  . Acquired tracheomalacia 05/26/2019  . Status post cervical spinal fusion 05/23/2019  . Chronic respiratory failure with hypoxia ([place]) 04/12/2018  . Hymenoptera allergy 11/10/2017  . Diverticulosis 08/07/2017  . Family history of colonic polyps 08/07/2017  . Myofascial pain 08/06/2017  . Seasonal and perennial allergic rhinitis 04/23/2017  . 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 Cervicalgia 04/01/2017  . Cervical spondylosis with radiculopathy 02/09/2017  . Memory difficulty 12/05/2016  . Morbid obesity ([place]) 12/05/2016  . Migraine without aura and without status migrainosus, not intractable 10/30/2016  . Lumbar radiculopathy 05/15/2016  . Osteoarthritis of spine with radiculopathy, lumbar region 05/15/2016  . Risk for falls 05/15/2016  . Recurrent infections 03/29/2016  . Chronic nonseasonal allergic rhinitis due to pollen 03/29/2016  . Polypharmacy 01/16/2016  . Morbid obesity with BMI of 50.0-59.9, adult ([place]) 03/06/2015  . SDAT 02/05/2015  . OSA and COPD overlap syndrome ([place]) 02/05/2015  . Medication management 08/02/2014  . GERD (gastroesophageal reflux disease) 05/09/2014  . Vitamin D deficiency 08/01/2013  . Prediabetes 08/01/2013  . 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 Diverticula of colon 05/07/2011  . Hypertension 01/31/2011  . Hyperlipidemia, mixed 01/31/2011  . BPH (benign  prostatic hyperplasia) 01/31/2011  . Testosterone Deficiency 01/31/2011  . IBS (irritable bowel syndrome) 01/31/2011  . Partial complex seizure disorder with intractable epilepsy ([place]) 01/31/2011  . Depression, major, recurrent, in partial remission ([place]) 01/31/2011  . Asthma-COPD overlap syndrome ([place]) 01/31/2011   Past Medical History:  Diagnosis Date  . Anesthesia complication requiring reversal agent administration    ? from central apnea, very difficult to get off vent  . Anxiety   . Arthritis    osteo  . Asthma   . BPH (benign prostatic hyperplasia)   . Complication of anesthesia    difficulty waking , they twlight me because of my respiratory problems "  . Depression   . Dyspnea    on exertion  . Enlarged heart   . Family history of adverse reaction to anesthesia    mother trouble waking up, and heart stopped  .  GERD (gastroesophageal reflux disease)   . Headache    botox injections for headaches  . Hyperlipidemia   . Hypertension   . Hypogonadism male   . IBS (irritable bowel syndrome)   . Memory difficulties    short term memories  . Obesity   . On home oxygen therapy    on 2 liter  . OSA (obstructive sleep apnea)    not using cpap  . Pneumonia   . Pre-diabetes   . Prostatitis     Family History  Problem Relation Age of Onset  . Diabetes Paternal Uncle   . Cancer Father        lymphoma, colon  . Diabetes Maternal Grandmother   . Heart disease Maternal Grandfather   . Diabetes Maternal Grandfather   . Diabetes Paternal Grandmother   . Diabetes Paternal Grandfather   . Dementia Mother   . Prostate cancer Maternal Uncle   . Lung disease Neg Hx   . Rheumatologic disease Neg Hx     Past Surgical History:  Procedure Laterality Date  . ABDOMINAL SURGERY    . ANKLE FRACTURE SURGERY Right   . ANTERIOR CERVICAL DECOMP/DISCECTOMY FUSION N/A 05/23/2019   Procedure: ANTERIOR CERVICAL DISCECTOMY FUSION CERVICAL FIVE THROUGH CERVICAL SIX AND CERVICAL SIX  THROUGH CERVICAL SEVEN;  Surgeon: Jessy Oto, MD;  Location: [facility];  Service: Orthopedics;  Laterality: N/A;  . COLONOSCOPY    . CYSTOSCOPY     [place]  . KNEE ARTHROSCOPY WITH MEDIAL MENISECTOMY Left 01/02/2017   Procedure: LEFT KNEE ARTHROSCOPY WITH PARTIAL MEDIAL MENISCECTOMY;  Surgeon: Mcarthur Rossetti, MD;  Location[facility];  Service: Orthopedics;  Laterality: Left;  . TONSILLECTOMY    . [place] RESECTION  2007  . UVULOPALATOPHARYNGOPLASTY     Social History   Occupational History  . Occupation: Dance movement psychotherapist  Tobacco Use  . Smoking status: Former Smoker    Packs/day: 0.10    Years: 15.00    Pack years: 1.50  . Smokeless tobacco: Never Used  . Tobacco comment: significant second-hand exposure through mother  Vaping Use  . Vaping Use: Never used  Substance and Sexual Activity  . Alcohol use: Yes    Alcohol/week: 1.0 standard drink    Types: 1 Glasses of wine per week    Comment: 2 x a year  . Drug use: No  . Sexual activity: Never

## 2020-04-30 ENCOUNTER — Ambulatory Visit: Payer: BC Managed Care – PPO | Admitting: Adult Health Nurse Practitioner

## 2020-05-14 DIAGNOSIS — N401 Enlarged prostate with lower urinary tract symptoms: Secondary | ICD-10-CM | POA: Diagnosis not present

## 2020-05-14 DIAGNOSIS — R350 Frequency of micturition: Secondary | ICD-10-CM | POA: Diagnosis not present

## 2020-05-15 ENCOUNTER — Other Ambulatory Visit: Payer: Self-pay | Admitting: Internal Medicine

## 2020-05-16 DIAGNOSIS — J449 Chronic obstructive pulmonary disease, unspecified: Secondary | ICD-10-CM | POA: Diagnosis not present

## 2020-05-16 DIAGNOSIS — J9611 Chronic respiratory failure with hypoxia: Secondary | ICD-10-CM | POA: Diagnosis not present

## 2020-05-16 DIAGNOSIS — G43719 Chronic migraine without aura, intractable, without status migrainosus: Secondary | ICD-10-CM | POA: Diagnosis not present

## 2020-05-20 DIAGNOSIS — G4733 Obstructive sleep apnea (adult) (pediatric): Secondary | ICD-10-CM | POA: Diagnosis not present

## 2020-05-20 DIAGNOSIS — J449 Chronic obstructive pulmonary disease, unspecified: Secondary | ICD-10-CM | POA: Diagnosis not present

## 2020-05-20 DIAGNOSIS — R069 Unspecified abnormalities of breathing: Secondary | ICD-10-CM | POA: Diagnosis not present

## 2020-05-28 ENCOUNTER — Encounter: Payer: Self-pay | Admitting: Specialist

## 2020-05-28 ENCOUNTER — Ambulatory Visit: Payer: BC Managed Care – PPO | Admitting: Specialist

## 2020-05-28 ENCOUNTER — Other Ambulatory Visit: Payer: Self-pay

## 2020-05-28 VITALS — BP 134/85 | HR 81 | Ht 72.0 in | Wt 354.0 lb

## 2020-05-28 DIAGNOSIS — M5010 Cervical disc disorder with radiculopathy, unspecified cervical region: Secondary | ICD-10-CM | POA: Diagnosis not present

## 2020-05-28 DIAGNOSIS — R29898 Other symptoms and signs involving the musculoskeletal system: Secondary | ICD-10-CM

## 2020-05-28 DIAGNOSIS — M5136 Other intervertebral disc degeneration, lumbar region: Secondary | ICD-10-CM

## 2020-05-28 DIAGNOSIS — M25511 Pain in right shoulder: Secondary | ICD-10-CM

## 2020-05-28 DIAGNOSIS — Z981 Arthrodesis status: Secondary | ICD-10-CM | POA: Diagnosis not present

## 2020-05-28 NOTE — Patient Instructions (Signed)
Plan: Avoid overhead lifting and overhead use of the arms. Do not lift greater than 5 lbs. Adjust head rest in vehicle to prevent hyperextension if rear ended. Take extra precautions to avoid falling, including use of a cane if you feel weak. Scheduling secretary Tivis Ringer. will call you to arrange for surgery for your cervical spine. If you wish a second opinion please let us know and we can arrange for you. If you have worsening arm or leg numbness or weakness please call or go to an ER. We will contact your cardiologist and primary care physicians to seek clearance for your surgery. Surgery will be an posterior left C6-7 and C7-T1 foraminotomies with decompression of the left C7 and C8 foramen and excision of the disc herniation left C7-T1  with use of the operating microscope.  Risks of surgery include risks of infection, bleeding and risks to the spinal cord and  Improve over the next 4-6 weeks following surgery. Surgery is indicated due to upper extremity radiculopathy, lermittes phenomena and falls. In the future surgery at adjacent levels may be necessary but these levels do not appear to be related to your current symptoms or signs.  Follow-Up Instructions: Return in about 4 weeks (around 05/24/2020).   Orders:  No orders of the defined types were placed in this encounter.  No orders of the defined types were placed in this encounter.

## 2020-05-28 NOTE — Progress Notes (Signed)
Office Visit Note   Patient: Mark Harrell           Date of Birth: [birth year]           MRN: [id number] Visit Date: 05/28/2020              Requested by: [name], [address] [place],  [place] [address] PCP: [name], MD   Assessment & Plan: Visit Diagnoses:  1. Status post cervical spinal fusion   2. Herniation of cervical intervertebral disc with radiculopathy   3. DDD (degenerative disc disease), lumbar   4. Weakness of shoulder   5. Acute pain of right shoulder     Plan: Plan: Avoid overhead lifting and overhead use of the arms. Do not lift greater than 5 lbs. Adjust head rest in vehicle to prevent hyperextension if rear ended. Take extra precautions to avoid falling, including use of a cane if you feel weak. Scheduling secretary Kandice Hams. will call you to arrange for surgery for your cervical spine. If you wish a second opinion please let us know and we can arrange for you. If you have worsening arm or leg numbness or weakness please call or go to an ER. We will contact your cardiologist and primary care physicians to seek clearance for your surgery. Surgery will be an posterior left C6-7 and C7-T1 foraminotomies with decompression of the left C7 and C8 foramen and excision of the disc herniation left C7-T1  with use of the operating microscope.  Risks of surgery include risks of infection, bleeding and risks to the spinal cord and  Improve over the next 4-6 weeks following surgery. Surgery is indicated due to upper extremity radiculopathy, lermittes phenomena and falls. In the future surgery at adjacent levels may be necessary but these levels do not appear to be related to your current symptoms or signs.  Follow-Up Instructions: Return in about 4 weeks (around 05/24/2020).   Orders:  No orders of the defined types were placed in this encounter.  No orders of the defined types were placed in this  encounter.   Follow-Up Instructions: No follow-ups on file.   Orders:  No orders of the defined types were placed in this encounter.  No orders of the defined types were placed in this encounter.     Procedures: No procedures performed   Clinical Data: No additional findings.   Subjective: Chief Complaint  Patient presents with  . Neck - Follow-up    60 year old amidextreus male with history of cervical fusion C5-6 and C6-7. He is experiencing left arm pain and numbness and repeat MRI shows a worsening HNP left C7-T1. The study shows the protrusion in the posterolateral canal affecting primarily the C8 nerve root.  He returns with persistent pain and left arm weakness. He is still having difficutly with prolong standing and walking. He has muscle atrophy in his legs and arms leftt sife.    Review of Systems  Constitutional: Negative.   HENT: Negative.   Eyes: Negative.   Respiratory: Negative.   Cardiovascular: Negative.   Gastrointestinal: Negative.   Endocrine: Negative.   Genitourinary: Negative.   Musculoskeletal: Negative.   Skin: Negative.   Allergic/Immunologic: Negative.   Neurological: Negative.   Hematological: Negative.   Psychiatric/Behavioral: Negative.      Objective: Vital Signs: BP 134/85 (BP Location: Left Arm, Patient Position: Sitting)   Pulse 81   Ht 6' (1.829 m)   Wt (!) 354 lb (160.6 kg)  BMI 48.01 kg/m   Physical Exam Musculoskeletal:     Lumbar back: Negative right straight leg raise test and negative left straight leg raise test.     Back Exam   Tenderness  The patient is experiencing tenderness in the cervical.  Range of Motion  Extension: abnormal  Flexion: abnormal  Lateral bend right: abnormal  Lateral bend left: abnormal  Rotation right: abnormal  Rotation left: abnormal   Muscle Strength  Right Quadriceps:  5/5  Left Quadriceps:  5/5  Right Hamstrings:  5/5  Left Hamstrings:  5/5   Tests  Straight leg  raise right: negative Straight leg raise left: negative  Reflexes  Patellar: 2/4 Achilles: 2/4  Other  Toe walk: normal Heel walk: normal Erythema: no back redness Scars: present      Specialty Comments:  No specialty comments available.  Imaging: No results found.   PMFS History: Patient Active Problem List   Diagnosis Date Noted  . HNP (herniated nucleus pulposus) with myelopathy, cervical 05/23/2019    Priority: High    Class: Chronic  . Spinal stenosis of cervical region 05/23/2019    Priority: High    Class: Chronic  . Tracheomalacia 05/26/2019  . Acquired tracheomalacia 05/26/2019  . Status post cervical spinal fusion 05/23/2019  . Chronic respiratory failure with hypoxia (HCC) 04/12/2018  . Hymenoptera allergy 11/10/2017  . Diverticulosis 08/07/2017  . Family history of colonic polyps 08/07/2017  . Myofascial pain 08/06/2017  . Seasonal and perennial allergic rhinitis 04/23/2017  . 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 Cervicalgia 04/01/2017  . Cervical spondylosis with radiculopathy 02/09/2017  . Memory difficulty 12/05/2016  . Morbid obesity (HCC) 12/05/2016  . Migraine without aura and without status migrainosus, not intractable 10/30/2016  . Lumbar radiculopathy 05/15/2016  . Osteoarthritis of spine with radiculopathy, lumbar region 05/15/2016  . Risk for falls 05/15/2016  . Recurrent infections 03/29/2016  . Chronic nonseasonal allergic rhinitis due to pollen 03/29/2016  . Polypharmacy 01/16/2016  . Morbid obesity with BMI of 50.0-59.9, adult (HCC) 03/06/2015  . SDAT 02/05/2015  . OSA and COPD overlap syndrome (HCC) 02/05/2015  . Medication management 08/02/2014  . GERD (gastroesophageal reflux disease) 05/09/2014  . Vitamin D deficiency 08/01/2013  . Prediabetes 08/01/2013  . 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 Diverticula of colon 05/07/2011  . Hypertension 01/31/2011  . Hyperlipidemia, mixed 01/31/2011  . BPH (benign prostatic hyperplasia)  01/31/2011  . Testosterone Deficiency 01/31/2011  . IBS (irritable bowel syndrome) 01/31/2011  . Partial complex seizure disorder with intractable epilepsy (HCC) 01/31/2011  . Depression, major, recurrent, in partial remission (HCC) 01/31/2011  . Asthma-COPD overlap syndrome (HCC) 01/31/2011   Past Medical History:  Diagnosis Date  . Anesthesia complication requiring reversal agent administration    ? from central apnea, very difficult to get off vent  . Anxiety   . Arthritis    osteo  . Asthma   . BPH (benign prostatic hyperplasia)   . Complication of anesthesia    difficulty waking , they twlight me because of my respiratory problems "  . Depression   . Dyspnea    on exertion  . Enlarged heart   . Family history of adverse reaction to anesthesia    mother trouble waking up, and heart stopped  . GERD (gastroesophageal reflux disease)   . Headache    botox injections for headaches  . Hyperlipidemia   . Hypertension   . Hypogonadism male   . IBS (irritable bowel syndrome)   . Memory difficulties  short term memories  . Obesity   . On home oxygen therapy    on 2 liter  . OSA (obstructive sleep apnea)    not using cpap  . Pneumonia   . Pre-diabetes   . Prostatitis     Family History  Problem Relation Age of Onset  . Diabetes Paternal Uncle   . Cancer Father        lymphoma, colon  . Diabetes Maternal Grandmother   . Heart disease Maternal Grandfather   . Diabetes Maternal Grandfather   . Diabetes Paternal Grandmother   . Diabetes Paternal Grandfather   . Dementia Mother   . Prostate cancer Maternal Uncle   . Lung disease Neg Hx   . Rheumatologic disease Neg Hx     Past Surgical History:  Procedure Laterality Date  . ABDOMINAL SURGERY    . ANKLE FRACTURE SURGERY Right   . ANTERIOR CERVICAL DECOMP/DISCECTOMY FUSION N/A 05/23/2019   Procedure: ANTERIOR CERVICAL DISCECTOMY FUSION CERVICAL FIVE THROUGH CERVICAL SIX AND CERVICAL SIX THROUGH CERVICAL SEVEN;   Surgeon: Jessy Oto, MD;  Location: [facility];  Service: Orthopedics;  Laterality: N/A;  . COLONOSCOPY    . CYSTOSCOPY     [facility]  . KNEE ARTHROSCOPY WITH MEDIAL MENISECTOMY Left 01/02/2017   Procedure: LEFT KNEE ARTHROSCOPY WITH PARTIAL MEDIAL MENISCECTOMY;  Surgeon: Mcarthur Rossetti, MD;  Location: [facility];  Service: Orthopedics;  Laterality: Left;  . TONSILLECTOMY    . [place] RESECTION  2007  . UVULOPALATOPHARYNGOPLASTY     Social History   Occupational History  . Occupation: Dance movement psychotherapist  Tobacco Use  . Smoking status: Former Smoker    Packs/day: 0.10    Years: 15.00    Pack years: 1.50  . Smokeless tobacco: Never Used  . Tobacco comment: significant second-hand exposure through mother  Vaping Use  . Vaping Use: Never used  Substance and Sexual Activity  . Alcohol use: Yes    Alcohol/week: 1.0 standard drink    Types: 1 Glasses of wine per week    Comment: 2 x a year  . Drug use: No  . Sexual activity: Never

## 2020-06-04 DIAGNOSIS — R3915 Urgency of urination: Secondary | ICD-10-CM | POA: Diagnosis not present

## 2020-06-04 DIAGNOSIS — R350 Frequency of micturition: Secondary | ICD-10-CM | POA: Diagnosis not present

## 2020-06-04 DIAGNOSIS — N401 Enlarged prostate with lower urinary tract symptoms: Secondary | ICD-10-CM | POA: Diagnosis not present

## 2020-06-11 ENCOUNTER — Other Ambulatory Visit: Payer: Self-pay | Admitting: Internal Medicine

## 2020-06-11 DIAGNOSIS — J449 Chronic obstructive pulmonary disease, unspecified: Secondary | ICD-10-CM

## 2020-06-13 DIAGNOSIS — F419 Anxiety disorder, unspecified: Secondary | ICD-10-CM | POA: Diagnosis not present

## 2020-06-13 DIAGNOSIS — G43719 Chronic migraine without aura, intractable, without status migrainosus: Secondary | ICD-10-CM | POA: Diagnosis not present

## 2020-06-13 DIAGNOSIS — F3341 Major depressive disorder, recurrent, in partial remission: Secondary | ICD-10-CM | POA: Diagnosis not present

## 2020-06-13 DIAGNOSIS — M4722 Other spondylosis with radiculopathy, cervical region: Secondary | ICD-10-CM | POA: Diagnosis not present

## 2020-06-16 ENCOUNTER — Other Ambulatory Visit: Payer: Self-pay | Admitting: Allergy & Immunology

## 2020-06-16 DIAGNOSIS — J9611 Chronic respiratory failure with hypoxia: Secondary | ICD-10-CM | POA: Diagnosis not present

## 2020-06-16 DIAGNOSIS — J449 Chronic obstructive pulmonary disease, unspecified: Secondary | ICD-10-CM | POA: Diagnosis not present

## 2020-06-20 ENCOUNTER — Telehealth: Payer: Self-pay | Admitting: *Deleted

## 2020-06-20 DIAGNOSIS — R069 Unspecified abnormalities of breathing: Secondary | ICD-10-CM | POA: Diagnosis not present

## 2020-06-20 DIAGNOSIS — J449 Chronic obstructive pulmonary disease, unspecified: Secondary | ICD-10-CM | POA: Diagnosis not present

## 2020-06-20 DIAGNOSIS — G43709 Chronic migraine without aura, not intractable, without status migrainosus: Secondary | ICD-10-CM | POA: Diagnosis not present

## 2020-06-20 DIAGNOSIS — G4733 Obstructive sleep apnea (adult) (pediatric): Secondary | ICD-10-CM | POA: Diagnosis not present

## 2020-06-20 NOTE — Telephone Encounter (Signed)
[facility] reached out to find out if patient still ontherapy somewhere else or samples.  I advised that patient was suppose to get Rx from them and was suppose to reach back out to them in Oct 2021 but has not filled same since Jan 2021. I advised them to D/c same since it seems he never reached out to restart

## 2020-06-24 ENCOUNTER — Other Ambulatory Visit: Payer: Self-pay | Admitting: Internal Medicine

## 2020-06-25 ENCOUNTER — Encounter: Payer: Self-pay | Admitting: Specialist

## 2020-06-25 ENCOUNTER — Ambulatory Visit: Payer: BC Managed Care – PPO | Admitting: Specialist

## 2020-06-25 ENCOUNTER — Other Ambulatory Visit: Payer: Self-pay

## 2020-06-25 VITALS — BP 134/87 | HR 88 | Ht 72.0 in | Wt 358.0 lb

## 2020-06-25 DIAGNOSIS — M5010 Cervical disc disorder with radiculopathy, unspecified cervical region: Secondary | ICD-10-CM | POA: Diagnosis not present

## 2020-06-25 DIAGNOSIS — M51369 Other intervertebral disc degeneration, lumbar region without mention of lumbar back pain or lower extremity pain: Secondary | ICD-10-CM

## 2020-06-25 DIAGNOSIS — M5136 Other intervertebral disc degeneration, lumbar region: Secondary | ICD-10-CM

## 2020-06-25 DIAGNOSIS — M25511 Pain in right shoulder: Secondary | ICD-10-CM

## 2020-06-25 DIAGNOSIS — M4819 Ankylosing hyperostosis [Forestier], multiple sites in spine: Secondary | ICD-10-CM | POA: Diagnosis not present

## 2020-06-25 DIAGNOSIS — R29898 Other symptoms and signs involving the musculoskeletal system: Secondary | ICD-10-CM

## 2020-06-25 DIAGNOSIS — Z981 Arthrodesis status: Secondary | ICD-10-CM

## 2020-06-25 DIAGNOSIS — M1812 Unilateral primary osteoarthritis of first carpometacarpal joint, left hand: Secondary | ICD-10-CM

## 2020-06-25 NOTE — Progress Notes (Signed)
Office Visit Note   Patient: Mark Harrell           Date of Birth: [birth year]           MRN: [id number] Visit Date: 06/25/2020              Requested by: [name], [address] [place],  [place] [address] PCP: [name], MD   Assessment & Plan: Visit Diagnoses:  1. Herniation of cervical intervertebral disc with radiculopathy   2. Status post cervical spinal fusion   3. DDD (degenerative disc disease), lumbar   4. Forestier's disease of multiple sites   5. Weakness of shoulder   6. Acute pain of right shoulder     Plan: Plan: Plan:Avoid overhead lifting and overhead use of the arms. Do not lift greater than 5 lbs. Adjust head rest in vehicle to prevent hyperextension if rear ended. Take extra precautions to avoid falling, including use of a cane if you feel weak. Scheduling secretary Kandice Hams. will call you to arrange for surgery for your cervical spine. If you wish a second opinion please let us know and we can arrange for you. If you have worsening arm or leg numbness or weakness please call or go to an ER. We will contact your cardiologist and primary care physicians to seek clearance for your surgery. Surgery will be an posterior left C6-7 and C7-T1 foraminotomies with decompression of the left C7 and C8 foramen and excision of the disc herniation left C7-T1  with use of the operating microscope.  Risks of surgery include risks of infection, bleeding and risks to the spinal cord and  Improve over the next 4-6 weeks following surgery. Surgery is indicated due to upper extremity radiculopathy, lermittes phenomena and falls. In the future surgery at adjacent levels may be necessary but these levels do not appear to be related to your current symptoms or signs.   Follow-Up Instructions: No follow-ups on file.   Orders:  No orders of the defined types were placed in this encounter.  No orders of the defined types were placed in  this encounter.     Procedures: No procedures performed   Clinical Data: Findings:  CLINICAL DATA:  Initial evaluation for neck pain, cervical radiculopathy. History of prior surgery.  EXAM: MRI CERVICAL SPINE WITHOUT CONTRAST  TECHNIQUE: Multiplanar, multisequence MR imaging of the cervical spine was performed. No intravenous contrast was administered.  COMPARISON:  Prior CT from 10/31/2019 as well as previous MRI from 03/06/2019.  FINDINGS: Alignment: Straightening of the normal cervical lordosis. No listhesis.  Vertebrae: Prior fusion at C5-C7. Vertebral body height maintained without evidence for acute or chronic fracture. Bone marrow signal intensity within normal limits. No discrete or worrisome osseous lesions. No abnormal marrow edema.  Cord: Normal signal and morphology.  Posterior Fossa, vertebral arteries, paraspinal tissues: Visualized brain and posterior fossa within normal limits. Craniocervical junction normal. Paraspinous and prevertebral soft tissues within normal limits. Normal intravascular flow voids seen within the vertebral arteries bilaterally.  Disc levels:  C2-C3: Right-sided uncovertebral hypertrophy without significant disc bulge. Mild bilateral facet degeneration. Resultant mild to moderate right C3 foraminal stenosis. No significant left foraminal narrowing. Central canal remains widely patent.  C3-C4: Mild disc bulge with bilateral uncovertebral hypertrophy. Flattening with partial indentation of the ventral thecal sac with resultant mild spinal stenosis. No cord deformity. Moderate right with mild left C4 foraminal stenosis.  C4-C5: Mild left uncovertebral hypertrophy without significant disc bulge. No spinal  stenosis. Mild left C5 foraminal narrowing. Right neural foramina remains patent.  C5-C6: Prior fusion. No residual spinal stenosis. Foramina appear grossly patent.  C6-C7: Prior fusion. Central canal remains  widely patent. Residual uncovertebral hypertrophy with residual moderate bilateral C7 foraminal stenosis.  C7-T1: Broad left subarticular disc protrusion indents the left ventral thecal sac (series 6, image 28). Mild flattening of the left ventral cord without cord signal changes. Mild spinal stenosis. Mild left C8 foraminal narrowing. Right neural foramina remains patent.  Visualized upper thoracic spine demonstrates mild disc bulge at T2-3 without significant stenosis.  IMPRESSION: 1. Prior fusion at C5-C7 without residual spinal stenosis. Residual uncovertebral hypertrophy at C6-7 with residual moderate bilateral C7 foraminal narrowing. 2. Left subarticular disc protrusion at C7-T1 with resultant mild flattening of the left ventral cord and mild left C8 foraminal stenosis. 3. Disc bulging with uncovertebral hypertrophy at C3-4 with resultant mild canal with moderate right C4 foraminal stenosis. 4. Right-sided uncovertebral hypertrophy at C2-3 with resultant mild to moderate right C3 foraminal stenosis.   Electronically Signed   By: Jeannine Boga M.D.   On: 04/19/2020 07:08     Subjective: Chief Complaint  Patient presents with  . Neck - Follow-up    HPI  Review of Systems  Constitutional: Negative.   HENT: Negative.   Eyes: Negative.   Respiratory: Negative.   Cardiovascular: Negative.   Gastrointestinal: Negative.   Endocrine: Negative.   Genitourinary: Negative.   Musculoskeletal: Negative.   Skin: Negative.   Allergic/Immunologic: Negative.   Neurological: Negative.   Hematological: Negative.   Psychiatric/Behavioral: Negative.      Objective: Vital Signs: BP 134/87 (BP Location: Left Arm, Patient Position: Sitting)   Pulse 88   Ht 6' (1.829 m)   Wt (!) 358 lb (162.4 kg)   BMI 48.55 kg/m   Physical Exam Constitutional:      Appearance: He is well-developed and well-nourished.  HENT:     Head: Normocephalic and atraumatic.   Eyes:     Extraocular Movements: EOM normal.     Pupils: Pupils are equal, round, and reactive to light.  Pulmonary:     Effort: Pulmonary effort is normal.     Breath sounds: Normal breath sounds.  Abdominal:     General: Bowel sounds are normal.     Palpations: Abdomen is soft.  Musculoskeletal:     Cervical back: Normal range of motion and neck supple.     Lumbar back: Negative right straight leg raise test and negative left straight leg raise test.  Skin:    General: Skin is warm and dry.  Neurological:     Mental Status: He is alert and oriented to person, place, and time.  Psychiatric:        Mood and Affect: Mood and affect normal.        Behavior: Behavior normal.        Thought Content: Thought content normal.        Judgment: Judgment normal.     Back Exam   Tenderness  The patient is experiencing tenderness in the cervical.  Range of Motion  Extension: abnormal  Flexion: abnormal  Lateral bend right: abnormal  Lateral bend left: abnormal  Rotation right: abnormal  Rotation left: abnormal   Muscle Strength  Right Quadriceps:  5/5  Left Quadriceps:  5/5  Right Hamstrings:  5/5   Tests  Straight leg raise right: negative Straight leg raise left: negative  Comments:  Weak both shoulder abduction and right biceps  greater than left right wrist VF and right finger extension.       Specialty Comments:  No specialty comments available.  Imaging: No results found.   PMFS History: Patient Active Problem List   Diagnosis Date Noted  . HNP (herniated nucleus pulposus) with myelopathy, cervical 05/23/2019    Priority: High    Class: Chronic  . Spinal stenosis of cervical region 05/23/2019    Priority: High    Class: Chronic  . Tracheomalacia 05/26/2019  . Acquired tracheomalacia 05/26/2019  . Status post cervical spinal fusion 05/23/2019  . Chronic respiratory failure with hypoxia ([facility]) 04/12/2018  . Hymenoptera allergy 11/10/2017  .  Diverticulosis 08/07/2017  . Family history of colonic polyps 08/07/2017  . Myofascial pain 08/06/2017  . Seasonal and perennial allergic rhinitis 04/23/2017  . 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 Cervicalgia 04/01/2017  . Cervical spondylosis with radiculopathy 02/09/2017  . Memory difficulty 12/05/2016  . Morbid obesity ([place] [facility]) 12/05/2016  . Migraine without aura and without status migrainosus, not intractable 10/30/2016  . Lumbar radiculopathy 05/15/2016  . Osteoarthritis of spine with radiculopathy, lumbar region 05/15/2016  . Risk for falls 05/15/2016  . Recurrent infections 03/29/2016  . Chronic nonseasonal allergic rhinitis due to pollen 03/29/2016  . Polypharmacy 01/16/2016  . Morbid obesity with BMI of 50.0-59.9, adult ([facility]) 03/06/2015  . SDAT 02/05/2015  . OSA and COPD overlap syndrome ([facility]) 02/05/2015  . Medication management 08/02/2014  . GERD (gastroesophageal reflux disease) 05/09/2014  . Vitamin D deficiency 08/01/2013  . Prediabetes 08/01/2013  . 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 Diverticula of colon 05/07/2011  . Hypertension 01/31/2011  . Hyperlipidemia, mixed 01/31/2011  . BPH (benign prostatic hyperplasia) 01/31/2011  . Testosterone Deficiency 01/31/2011  . IBS (irritable bowel syndrome) 01/31/2011  . Partial complex seizure disorder with intractable epilepsy ([facility] [facility]) 01/31/2011  . Depression, major, recurrent, in partial remission ([place] [facility]) 01/31/2011  . Asthma-COPD overlap syndrome ([facility]) 01/31/2011   Past Medical History:  Diagnosis Date  . Anesthesia complication requiring reversal agent administration    ? from central apnea, very difficult to get off vent  . Anxiety   . Arthritis    osteo  . Asthma   . BPH (benign prostatic hyperplasia)   . Complication of anesthesia    difficulty waking , they twlight me because of my respiratory problems "  . Depression   . Dyspnea    on exertion  . Enlarged heart   . Family history of adverse reaction to anesthesia     mother trouble waking up, and heart stopped  . GERD (gastroesophageal reflux disease)   . Headache    botox injections for headaches  . Hyperlipidemia   . Hypertension   . Hypogonadism male   . IBS (irritable bowel syndrome)   . Memory difficulties    short term memories  . Obesity   . On home oxygen therapy    on 2 liter  . OSA (obstructive sleep apnea)    not using cpap  . Pneumonia   . Pre-diabetes   . Prostatitis     Family History  Problem Relation Age of Onset  . Diabetes Paternal Uncle   . Cancer Father        lymphoma, colon  . Diabetes Maternal Grandmother   . Heart disease Maternal Grandfather   . Diabetes Maternal Grandfather   . Diabetes Paternal Grandmother   . Diabetes Paternal Grandfather   . Dementia Mother   . Prostate cancer Maternal Uncle   . Lung  disease Neg Hx   . Rheumatologic disease Neg Hx     Past Surgical History:  Procedure Laterality Date  . ABDOMINAL SURGERY    . ANKLE FRACTURE SURGERY Right   . ANTERIOR CERVICAL DECOMP/DISCECTOMY FUSION N/A 05/23/2019   Procedure: ANTERIOR CERVICAL DISCECTOMY FUSION CERVICAL FIVE THROUGH CERVICAL SIX AND CERVICAL SIX THROUGH CERVICAL SEVEN;  Surgeon: Jessy Oto, MD;  Location: [facility];  Service: Orthopedics;  Laterality: N/A;  . COLONOSCOPY    . CYSTOSCOPY     [facility]  . KNEE ARTHROSCOPY WITH MEDIAL MENISECTOMY Left 01/02/2017   Procedure: LEFT KNEE ARTHROSCOPY WITH PARTIAL MEDIAL MENISCECTOMY;  Surgeon: Mcarthur Rossetti, MD;  Location: [facility];  Service: Orthopedics;  Laterality: Left;  . TONSILLECTOMY    . [place] RESECTION  2007  . UVULOPALATOPHARYNGOPLASTY     Social History   Occupational History  . Occupation: Dance movement psychotherapist  Tobacco Use  . Smoking status: Former Smoker    Packs/day: 0.10    Years: 15.00    Pack years: 1.50  . Smokeless tobacco: Never Used  . Tobacco comment: significant second-hand exposure through mother  Vaping Use  . Vaping Use: Never used  Substance  and Sexual Activity  . Alcohol use: Yes    Alcohol/week: 1.0 standard drink    Types: 1 Glasses of wine per week    Comment: 2 x a year  . Drug use: No  . Sexual activity: Never

## 2020-06-25 NOTE — Patient Instructions (Addendum)
Plan:Avoid overhead lifting and overhead use of the arms. Do not lift greater than 5 lbs. Adjust head rest in vehicle to prevent hyperextension if rear ended. Take extra precautions to avoid falling, including use of a cane if you feel weak. Scheduling secretary Kandice Hams. will call you to arrange for surgery for your cervical spine. If you wish a second opinion please let us know and we can arrange for you. If you have worsening arm or leg numbness or weakness please call or go to an ER. We will contact your cardiologist and primary care physicians to seek clearance for your surgery. Surgery will be an posterior left C6-7 and C7-T1 foraminotomies with decompression of the left C7 and C8 foramen and excision of the disc herniation left C7-T1  with use of the operating microscope.  Risks of surgery include risks of infection, bleeding and risks to the spinal cord and  Improve over the next 4-6 weeks following surgery. Surgery is indicated due to upper extremity radiculopathy, lermittes phenomena and falls. In the future surgery at adjacent levels may be necessary but these levels do not appear to be related to your current symptoms or signs. Schedule for left thumb injection, u/s guided MC-C left

## 2020-06-27 DIAGNOSIS — E291 Testicular hypofunction: Secondary | ICD-10-CM | POA: Diagnosis not present

## 2020-06-29 ENCOUNTER — Encounter: Payer: Self-pay | Admitting: Family Medicine

## 2020-06-29 ENCOUNTER — Ambulatory Visit: Payer: Self-pay

## 2020-06-29 ENCOUNTER — Other Ambulatory Visit: Payer: Self-pay

## 2020-06-29 ENCOUNTER — Ambulatory Visit (INDEPENDENT_AMBULATORY_CARE_PROVIDER_SITE_OTHER): Payer: BC Managed Care – PPO | Admitting: Family Medicine

## 2020-06-29 DIAGNOSIS — M79645 Pain in left finger(s): Secondary | ICD-10-CM | POA: Diagnosis not present

## 2020-06-29 DIAGNOSIS — G8929 Other chronic pain: Secondary | ICD-10-CM

## 2020-06-29 MED ORDER — TRAMADOL HCL 50 MG PO TABS
50.0000 mg | ORAL_TABLET | Freq: Four times a day (QID) | ORAL | 0 refills | Status: DC | PRN
Start: 2020-06-29 — End: 2021-05-15

## 2020-06-29 NOTE — Progress Notes (Signed)
Office Visit Note   Patient: Mark Harrell           Date of Birth: 1961-02-15           MRN: [id number] Visit Date: 06/29/2020 Requested by: [name], [address] [place],  [place] [address] PCP: [name], MD  Subjective: Chief Complaint  Patient presents with  . Left Thumb - Pain    Requesting another cortisone injection. Cannot remember when the last one was but it did help, but it did hurt a while post injection.    HPI: He is here for left thumb pain.  History of DJD at the [facility] joint.  Injection in August 2020 gave him good relief for a while, not pain-free but much better.  Lately has become much more intense and much more frequent, he is requesting another injection.  No numbness or tingling.              ROS:   All other systems were reviewed and are negative.  Objective: Vital Signs: There were no vitals taken for this visit.  Physical Exam:  General:  Alert and oriented, in no acute distress. Pulm:  Breathing unlabored. Psy:  Normal mood, congruent affect. Skin: No erythema Left thumb: He is tender at the [facility] joint and the MCP joint.  No crepitation.  Negative Finkelstein test.  Imaging: US Guided Needle Placement  Result Date: 06/29/2020 Ultrasound-guided left thumb CMC joint injection: After sterile prep with Betadine, injected 1 cc 0.25% bupivacaine and 3 mg betamethasone into the dorsal joint recess.   Assessment & Plan: 1.  Left thumb CMC arthrosis -Steroid injection as above.  Follow-up as needed.     Procedures: No procedures performed        PMFS History: Patient Active Problem List   Diagnosis Date Noted  . Tracheomalacia 05/26/2019  . Acquired tracheomalacia 05/26/2019  . HNP (herniated nucleus pulposus) with myelopathy, cervical 05/23/2019    Class: Chronic  . Spinal stenosis of cervical region 05/23/2019    Class: Chronic  . Status post cervical spinal fusion 05/23/2019  . Chronic respiratory failure  with hypoxia ([place]) 04/12/2018  . Bilateral lower extremity edema 12/29/2017  . Hymenoptera allergy 11/10/2017  . Diverticulosis 08/07/2017  . Family history of colonic polyps 08/07/2017  . Myofascial pain 08/06/2017  . Seasonal and perennial allergic rhinitis 04/23/2017  . 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 Cervicalgia 04/01/2017  . Cervical spondylosis with radiculopathy 02/09/2017  . Memory difficulty 12/05/2016  . Morbid obesity ([place]) 12/05/2016  . Migraine without aura and without status migrainosus, not intractable 10/30/2016  . Lumbar radiculopathy 05/15/2016  . Osteoarthritis of spine with radiculopathy, lumbar region 05/15/2016  . Risk for falls 05/15/2016  . Recurrent infections 03/29/2016  . Chronic nonseasonal allergic rhinitis due to pollen 03/29/2016  . Polypharmacy 01/16/2016  . Morbid obesity with BMI of 50.0-59.9, adult ([place]) 03/06/2015  . SDAT 02/05/2015  . OSA and COPD overlap syndrome ([place]) 02/05/2015  . Medication management 08/02/2014  . GERD (gastroesophageal reflux disease) 05/09/2014  . Vitamin D deficiency 08/01/2013  . Prediabetes 08/01/2013  . 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 Diverticula of colon 05/07/2011  . Hypertension 01/31/2011  . Hyperlipidemia, mixed 01/31/2011  . BPH (benign prostatic hyperplasia) 01/31/2011  . Testosterone Deficiency 01/31/2011  . IBS (irritable bowel syndrome) 01/31/2011  . Partial complex seizure disorder with intractable epilepsy ([place]) 01/31/2011  . Depression, major, recurrent, in partial remission ([place]) 01/31/2011  . Asthma-COPD overlap syndrome ([place])  01/31/2011   Past Medical History:  Diagnosis Date  . Anesthesia complication requiring reversal agent administration    ? from central apnea, very difficult to get off vent  . Anxiety   . Arthritis    osteo  . Asthma   . BPH (benign prostatic hyperplasia)   . Complication of anesthesia    difficulty waking , they twlight me because of my respiratory problems "  .  Depression   . Dyspnea    on exertion  . Enlarged heart   . Family history of adverse reaction to anesthesia    mother trouble waking up, and heart stopped  . GERD (gastroesophageal reflux disease)   . Headache    botox injections for headaches  . Hyperlipidemia   . Hypertension   . Hypogonadism male   . IBS (irritable bowel syndrome)   . Memory difficulties    short term memories  . Obesity   . On home oxygen therapy    on 2 liter  . OSA (obstructive sleep apnea)    not using cpap  . Pneumonia   . Pre-diabetes   . Prostatitis     Family History  Problem Relation Age of Onset  . Diabetes Paternal Uncle   . Cancer Father        lymphoma, colon  . Diabetes Maternal Grandmother   . Heart disease Maternal Grandfather   . Diabetes Maternal Grandfather   . Diabetes Paternal Grandmother   . Diabetes Paternal Grandfather   . Dementia Mother   . Prostate cancer Maternal Uncle   . Lung disease Neg Hx   . Rheumatologic disease Neg Hx     Past Surgical History:  Procedure Laterality Date  . ABDOMINAL SURGERY    . ANKLE FRACTURE SURGERY Right   . ANTERIOR CERVICAL DECOMP/DISCECTOMY FUSION N/A 05/23/2019   Procedure: ANTERIOR CERVICAL DISCECTOMY FUSION CERVICAL FIVE THROUGH CERVICAL SIX AND CERVICAL SIX THROUGH CERVICAL SEVEN;  Surgeon: Jessy Oto, MD;  Location: [facility];  Service: Orthopedics;  Laterality: N/A;  . COLONOSCOPY    . CYSTOSCOPY     [place]  . KNEE ARTHROSCOPY WITH MEDIAL MENISECTOMY Left 01/02/2017   Procedure: LEFT KNEE ARTHROSCOPY WITH PARTIAL MEDIAL MENISCECTOMY;  Surgeon: Mcarthur Rossetti, MD;  Location: [facility];  Service: Orthopedics;  Laterality: Left;  . TONSILLECTOMY    . [place] RESECTION  2007  . UVULOPALATOPHARYNGOPLASTY     Social History   Occupational History  . Occupation: Dance movement psychotherapist  Tobacco Use  . Smoking status: Former Smoker    Packs/day: 0.10    Years: 15.00    Pack years: 1.50  . Smokeless tobacco: Never Used  .  Tobacco comment: significant second-hand exposure through mother  Vaping Use  . Vaping Use: Never used  Substance and Sexual Activity  . Alcohol use: Yes    Alcohol/week: 1.0 standard drink    Types: 1 Glasses of wine per week    Comment: 2 x a year  . Drug use: No  . Sexual activity: Never

## 2020-07-10 ENCOUNTER — Other Ambulatory Visit: Payer: Self-pay

## 2020-07-10 ENCOUNTER — Ambulatory Visit (INDEPENDENT_AMBULATORY_CARE_PROVIDER_SITE_OTHER): Payer: BC Managed Care – PPO | Admitting: Allergy & Immunology

## 2020-07-10 ENCOUNTER — Other Ambulatory Visit: Payer: Self-pay | Admitting: *Deleted

## 2020-07-10 VITALS — BP 140/100 | HR 90 | Temp 98.8°F | Resp 18 | Ht 72.0 in

## 2020-07-10 DIAGNOSIS — J4540 Moderate persistent asthma, uncomplicated: Secondary | ICD-10-CM | POA: Diagnosis not present

## 2020-07-10 DIAGNOSIS — J449 Chronic obstructive pulmonary disease, unspecified: Secondary | ICD-10-CM | POA: Diagnosis not present

## 2020-07-10 DIAGNOSIS — J4489 Other specified chronic obstructive pulmonary disease: Secondary | ICD-10-CM

## 2020-07-10 DIAGNOSIS — J302 Other seasonal allergic rhinitis: Secondary | ICD-10-CM

## 2020-07-10 DIAGNOSIS — B999 Unspecified infectious disease: Secondary | ICD-10-CM

## 2020-07-10 DIAGNOSIS — J3089 Other allergic rhinitis: Secondary | ICD-10-CM

## 2020-07-10 DIAGNOSIS — L2084 Intrinsic (allergic) eczema: Secondary | ICD-10-CM | POA: Diagnosis not present

## 2020-07-10 MED ORDER — DUPIXENT 300 MG/2ML ~~LOC~~ SOSY: 600.0000 mg | PREFILLED_SYRINGE | Freq: Once | SUBCUTANEOUS | 11 refills | Status: AC

## 2020-07-10 NOTE — Patient Instructions (Addendum)
1. 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 - Make appointment for a Moderna booster on your way out.   2. Asthma-COPD overlap syndrome - Lung testing looked stable today.  - Tammy is going to be getting Dupixent approved once again.  - Call [facility] to confirm shipment: [phone] - Daily controller medication(s): Trelegy 100/62.5/25 one puff once daily - Prior to physical activity: ProAir 2 puffs 10-15 minutes before physical activity. - Rescue medications: ProAir 4 puffs every 4-6 hours as needed or DuoNeb nebulizer one vial every 4-6 hours as needed - Asthma control goals:  * Full participation in all desired activities (may need albuterol before activity) * Albuterol use two time or less a week on average (not counting use with activity) * Cough interfering with sleep two time or less a month * Oral steroids no more than once a year * No hospitalizations   3. Chronic allergic rhinitis (sweet vernal grass, box elder, cat, weeds, ragweed, molds, cockroach, dust mite) - Continue with aszelastine/fluticasone 2 sprays per nostril up to twice daily. - Continue with saline mist 1-2 times daily.  - Continue with cetirizine 10mg  daily as needed for breakthrough symptoms.  4. GERD - Continue famotidine as needed.  5. Return in about 4 months (around 11/07/2020).     Please inform us of any Emergency Department visits, hospitalizations, or changes in symptoms. Call us before going to the ED for breathing or allergy symptoms since we might be able to fit you in for a sick visit. Feel free to contact us anytime with any questions, problems, or concerns.  It was a pleasure to see you again today!  Websites that have reliable patient information: 1. American Academy of Asthma, Allergy, and Immunology: www.aaaai.org 2. Food Allergy Research and Education (FARE):  foodallergy.org 3. Mothers of Asthmatics: http://www.asthmacommunitynetwork.org 4. American College of Allergy, Asthma, and Immunology: www.acaai.org   COVID-19 Vaccine Information can be found at: ShippingScam.co.uk For questions related to vaccine distribution or appointments, please email [email or web address] or call [phone].   We realize that you might be concerned about having an allergic reaction to the COVID19 vaccines. To help with that concern, WE ARE OFFERING THE COVID19 VACCINES IN OUR OFFICE! Ask the front desk for dates!     "Like" [place] on Facebook and Instagram for our latest updates!      A healthy democracy works best when [employer] participate! Make sure you are registered to vote! If you have moved or changed any of your contact information, you will need to get this updated before voting!  In some cases, you MAY be able to register to vote online: [email or web address]

## 2020-07-10 NOTE — Progress Notes (Signed)
FOLLOW UP  Date of Service/Encounter:     Assessment:   Perennial and seasonalallergic rhinitis(sweet vernal grass, box elder, cat, weeds, ragweed, molds, cockroach, dust mite)  Recurrent infections-doing well onprophylactic doxycycline today  Asthma-COPD overlap syndrome-onTrelegy, but wanting to restart Dupixent (AEC 300)  Hymenoptera allergy - EpiPen up to date  GERD - onPRN H2 blocker  Obesity - with resulting nerve impingement and joint pain  Migraines - on botulinuminjections  Polypharmacy  Plan/Recommendations:   1. 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 - Make appointment for a Moderna booster on your way out.   2. Asthma-COPD overlap syndrome - Lung testing looked stable today.  - Tammy is going to be getting Dupixent approved once again.  - Call [facility] to confirm shipment: [phone] - Daily controller medication(s): Trelegy 100/62.5/25 one puff once daily - Prior to physical activity: ProAir 2 puffs 10-15 minutes before physical activity. - Rescue medications: ProAir 4 puffs every 4-6 hours as needed or DuoNeb nebulizer one vial every 4-6 hours as needed - Asthma control goals:  * Full participation in all desired activities (may need albuterol before activity) * Albuterol use two time or less a week on average (not counting use with activity) * Cough interfering with sleep two time or less a month * Oral steroids no more than once a year * No hospitalizations   3. Chronic allergic rhinitis (sweet vernal grass, box elder, cat, weeds, ragweed, molds, cockroach, dust mite) - Continue with aszelastine/fluticasone 2 sprays per nostril up to twice daily. - Continue with saline mist 1-2 times daily.  - Continue with cetirizine 10mg  daily as needed for breakthrough symptoms.  4. GERD - Continue famotidine as needed.  5.  Return in about 4 months (around 11/07/2020).   Subjective:   Mark Harrell is a 60 y.o. male presenting today for follow up of  Chief Complaint  Patient presents with  . Asthma    ACT - 16  Does not need refills today   . COPD    Mark Harrell has a history of the following: Patient Active Problem List   Diagnosis Date Noted  . Tracheomalacia 05/26/2019  . Acquired tracheomalacia 05/26/2019  . HNP (herniated nucleus pulposus) with myelopathy, cervical 05/23/2019    Class: Chronic  . Spinal stenosis of cervical region 05/23/2019    Class: Chronic  . Status post cervical spinal fusion 05/23/2019  . Chronic respiratory failure with hypoxia (Mark Harrell) 04/12/2018  . Bilateral lower extremity edema 12/29/2017  . Hymenoptera allergy 11/10/2017  . Diverticulosis 08/07/2017  . Family history of colonic polyps 08/07/2017  . Myofascial pain 08/06/2017  . Seasonal and perennial allergic rhinitis 04/23/2017  . 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 Cervicalgia 04/01/2017  . Cervical spondylosis with radiculopathy 02/09/2017  . Memory difficulty 12/05/2016  . Morbid obesity ([place]) 12/05/2016  . Migraine without aura and without status migrainosus, not intractable 10/30/2016  . Lumbar radiculopathy 05/15/2016  . Osteoarthritis of spine with radiculopathy, lumbar region 05/15/2016  . Risk for falls 05/15/2016  . Recurrent infections 03/29/2016  . Chronic nonseasonal allergic rhinitis due to pollen 03/29/2016  . Polypharmacy 01/16/2016  . Morbid obesity with BMI of 50.0-59.9, adult ([place]) 03/06/2015  . SDAT 02/05/2015  . OSA and COPD overlap syndrome ([place]) 02/05/2015  . Medication management 08/02/2014  . GERD (gastroesophageal reflux disease) 05/09/2014  . Vitamin D deficiency 08/01/2013  .  Prediabetes 08/01/2013  . 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 Diverticula of colon 05/07/2011  . Hypertension 01/31/2011  . Hyperlipidemia, mixed 01/31/2011  . BPH (benign prostatic hyperplasia) 01/31/2011  .  Testosterone Deficiency 01/31/2011  . IBS (irritable bowel syndrome) 01/31/2011  . Partial complex seizure disorder with intractable epilepsy ([place]) 01/31/2011  . Depression, major, recurrent, in partial remission ([place]) 01/31/2011  . Asthma-COPD overlap syndrome ([place]) 01/31/2011    History obtained from: chart review and patient.  Mark Harrell is a 60 y.o. male presenting for a follow up visit. He was last seen in October 2021. At that time, we continued with his prophylactic doxy. For his asthma, we continued with Trelegy one puff once daily. Allergic rhinitis was controlled with Dymista as well as nasal saline and cetirizine. Famotidine was continued for his GERD. He was interested in starting [place] for control of his asthma, but evidently this was not approved or he never talked to the pharmacy to discussing shipment.   Since the last visit, he has done mostly well. He remains interested in starting [place] again.   Asthma/Respiratory Symptom History: He is on Trelegy one puff once daily. He would like to be more active and therefore wants the [place] back on board. He is stuck waiting for another neck surgery. He felt that the [place]. Physically it is hard for him to do anything at all. He has had a lot of muscle atrophy since October 2019. He was injured from a fall and this has really put him for a tailspin with regards to his medical care.   Allergic Rhinitis Symptom History: He remains on the Dymista two sprays per nostril twice daily. He is on the cetirizine daily. He feels overall very good with this regimen.   Eczema Symptom History: He had a spot of eczema on his buttocks that helped with that. This has returned without the [place] on board.   Recurrent Infection Symptom History: He is vaccinated with Moderna. He was not sure whether he needed the third one. He hs not needed additional antibiotic courses at all for his symptoms.   Otherwise, there have been no changes to his past  medical history, surgical history, family history, or social history.    Review of Systems  Constitutional: Negative.  Negative for chills, fever, malaise/fatigue and weight loss.  HENT: Positive for congestion and sinus pain. Negative for ear discharge and ear pain.   Eyes: Negative for pain, discharge and redness.  Respiratory: Negative for cough, sputum production, shortness of breath and wheezing.   Cardiovascular: Negative.  Negative for chest pain and palpitations.  Gastrointestinal: Negative for abdominal pain, constipation, diarrhea, heartburn, nausea and vomiting.  Skin: Negative.  Negative for itching and rash.  Neurological: Negative for dizziness and headaches.  Endo/Heme/Allergies: Positive for environmental allergies. Does not bruise/bleed easily.       Objective:   Blood pressure (!) 140/100, pulse 90, temperature 98.8 F (37.1 C), resp. rate 18, height 6' (1.829 m), SpO2 95 %. Body mass index is 48.55 kg/m.   Physical Exam:  Physical Exam Constitutional:      Appearance: He is well-developed. He is obese.     Comments: Friendly as usual.   HENT:     Head: Normocephalic and atraumatic.     Right Ear: Tympanic membrane, ear canal and external ear normal.     Left Ear: Tympanic membrane, ear canal and external ear normal.     Nose: No nasal deformity, septal deviation, mucosal edema, rhinorrhea or  epistaxis.     Right Turbinates: Enlarged and swollen.     Left Turbinates: Enlarged and swollen.     Right Sinus: No maxillary sinus tenderness or frontal sinus tenderness.     Left Sinus: No maxillary sinus tenderness or frontal sinus tenderness.     Mouth/Throat:     Mouth: Oropharynx is clear and moist. Mucous membranes are not pale and not dry.     Pharynx: Uvula midline.  Eyes:     General:        Right eye: No discharge.        Left eye: No discharge.     Extraocular Movements: EOM normal.     Conjunctiva/sclera: Conjunctivae normal.     Right eye: Right  conjunctiva is not injected. No chemosis.    Left eye: Left conjunctiva is not injected. No chemosis.    Pupils: Pupils are equal, round, and reactive to light.  Cardiovascular:     Rate and Rhythm: Normal rate and regular rhythm.     Heart sounds: Normal heart sounds.  Pulmonary:     Effort: Pulmonary effort is normal. No tachypnea, accessory muscle usage or respiratory distress.     Breath sounds: Normal breath sounds. No wheezing, rhonchi or rales.     Comments: Decreased air movement at the bases, likely secondary to body habitus.  Chest:     Chest wall: No tenderness.  Lymphadenopathy:     Cervical: No cervical adenopathy.  Skin:    General: Skin is warm.     Capillary Refill: Capillary refill takes less than 2 seconds.     Coloration: Skin is not pale.     Findings: No abrasion, erythema, petechiae or rash. Rash is not papular, urticarial or vesicular.     Comments: No eczematous or urticarial lesions noted.   Neurological:     Mental Status: He is alert.  Psychiatric:        Mood and Affect: Mood and affect normal.        Behavior: Behavior is cooperative.      Diagnostic studies: none     [name], MD  [facility] [place] [place]

## 2020-07-12 ENCOUNTER — Other Ambulatory Visit: Payer: Self-pay

## 2020-07-12 ENCOUNTER — Ambulatory Visit: Payer: BC Managed Care – PPO | Admitting: Podiatry

## 2020-07-12 ENCOUNTER — Encounter: Payer: Self-pay | Admitting: Allergy & Immunology

## 2020-07-12 DIAGNOSIS — Q828 Other specified congenital malformations of skin: Secondary | ICD-10-CM | POA: Diagnosis not present

## 2020-07-12 DIAGNOSIS — M722 Plantar fascial fibromatosis: Secondary | ICD-10-CM

## 2020-07-12 DIAGNOSIS — B351 Tinea unguium: Secondary | ICD-10-CM

## 2020-07-12 DIAGNOSIS — M79676 Pain in unspecified toe(s): Secondary | ICD-10-CM

## 2020-07-15 NOTE — Progress Notes (Signed)
Presents today chief complaint of painful elongated toenails.  Also complaint of painful calluses on bilateral.  Objective: Vital signs stable alert oriented x3.  Pulses are palpable.  Neurologic sensorium is intact nails are thick yellow dystrophic with mycotic multiple reactive hyper keratomas plantar aspect of the bilateral foot.  Has mild tenderness on palpation of the plantar fascial just distal to the insertion sites.  Left greater than right.  Assessment: Midfoot plantar fasciitis painful elongated mycotic nails and reactive hyper keratomas.  Plan: Debridement of all reactive hyperkeratotic tissue benign skin lesions.  Debridement of toenails 1 through 5 bilateral.  Follow-up with him in a few months or as needed for plantar fasciitis.

## 2020-07-16 ENCOUNTER — Encounter: Payer: Self-pay | Admitting: Internal Medicine

## 2020-07-16 NOTE — Patient Instructions (Signed)
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
## 2020-07-17 ENCOUNTER — Other Ambulatory Visit: Payer: Self-pay

## 2020-07-17 ENCOUNTER — Ambulatory Visit (INDEPENDENT_AMBULATORY_CARE_PROVIDER_SITE_OTHER): Payer: BC Managed Care – PPO | Admitting: Internal Medicine

## 2020-07-17 VITALS — BP 124/88 | HR 65 | Temp 97.4°F | Resp 16 | Ht 72.0 in | Wt 335.6 lb

## 2020-07-17 DIAGNOSIS — E559 Vitamin D deficiency, unspecified: Secondary | ICD-10-CM

## 2020-07-17 DIAGNOSIS — E785 Hyperlipidemia, unspecified: Secondary | ICD-10-CM

## 2020-07-17 DIAGNOSIS — N181 Chronic kidney disease, stage 1: Secondary | ICD-10-CM

## 2020-07-17 DIAGNOSIS — E1122 Type 2 diabetes mellitus with diabetic chronic kidney disease: Secondary | ICD-10-CM | POA: Diagnosis not present

## 2020-07-17 DIAGNOSIS — Z79899 Other long term (current) drug therapy: Secondary | ICD-10-CM

## 2020-07-17 DIAGNOSIS — I10 Essential (primary) hypertension: Secondary | ICD-10-CM

## 2020-07-17 DIAGNOSIS — E1169 Type 2 diabetes mellitus with other specified complication: Secondary | ICD-10-CM

## 2020-07-17 DIAGNOSIS — J9611 Chronic respiratory failure with hypoxia: Secondary | ICD-10-CM | POA: Diagnosis not present

## 2020-07-17 DIAGNOSIS — J449 Chronic obstructive pulmonary disease, unspecified: Secondary | ICD-10-CM | POA: Diagnosis not present

## 2020-07-18 LAB — CBC WITH DIFFERENTIAL/PLATELET
Absolute Monocytes: 1112 cells/uL — ABNORMAL HIGH (ref 200–950)
Basophils Absolute: 92 cells/uL (ref 0–200)
Basophils Relative: 0.9 %
Eosinophils Absolute: 143 cells/uL (ref 15–500)
Eosinophils Relative: 1.4 %
HCT: 49.2 % (ref 38.5–50.0)
Hemoglobin: 16.6 g/dL (ref 13.2–17.1)
Lymphs Abs: 3040 cells/uL (ref 850–3900)
MCH: 29.7 pg (ref 27.0–33.0)
MCHC: 33.7 g/dL (ref 32.0–36.0)
MCV: 88 fL (ref 80.0–100.0)
MPV: 11 fL (ref 7.5–12.5)
Monocytes Relative: 10.9 %
Neutro Abs: 5814 cells/uL (ref 1500–7800)
Neutrophils Relative %: 57 %
Platelets: 293 10*3/uL (ref 140–400)
RBC: 5.59 10*6/uL (ref 4.20–5.80)
RDW: 13 % (ref 11.0–15.0)
Total Lymphocyte: 29.8 %
WBC: 10.2 10*3/uL (ref 3.8–10.8)

## 2020-07-18 LAB — COMPLETE METABOLIC PANEL WITH GFR
AG Ratio: 1.6 (calc) (ref 1.0–2.5)
ALT: 27 U/L (ref 9–46)
AST: 22 U/L (ref 10–35)
Albumin: 4 g/dL (ref 3.6–5.1)
Alkaline phosphatase (APISO): 67 U/L (ref 35–144)
BUN: 15 mg/dL (ref 7–25)
CO2: 31 mmol/L (ref 20–32)
Calcium: 9.5 mg/dL (ref 8.6–10.3)
Chloride: 101 mmol/L (ref 98–110)
Creat: 0.78 mg/dL (ref 0.70–1.33)
GFR, Est African American: 115 mL/min/{1.73_m2} (ref >=60)
GFR, Est Non African American: 99 mL/min/{1.73_m2} (ref >=60)
Globulin: 2.5 g/dL (calc) (ref 1.9–3.7)
Glucose, Bld: 91 mg/dL (ref 65–99)
Potassium: 4.5 mmol/L (ref 3.5–5.3)
Sodium: 141 mmol/L (ref 135–146)
Total Bilirubin: 1.3 mg/dL — ABNORMAL HIGH (ref 0.2–1.2)
Total Protein: 6.5 g/dL (ref 6.1–8.1)

## 2020-07-18 LAB — TSH: TSH: 1.52 mIU/L (ref 0.40–4.50)

## 2020-07-18 LAB — LIPID PANEL
Cholesterol: 170 mg/dL (ref <200)
HDL: 30 mg/dL — ABNORMAL LOW (ref >=40)
LDL Cholesterol (Calc): 91 mg/dL (calc)
Non-HDL Cholesterol (Calc): 140 mg/dL (calc) — ABNORMAL HIGH (ref <130)
Total CHOL/HDL Ratio: 5.7 (calc) — ABNORMAL HIGH (ref <5.0)
Triglycerides: 370 mg/dL — ABNORMAL HIGH (ref <150)

## 2020-07-18 LAB — VITAMIN D 25 HYDROXY (VIT D DEFICIENCY, FRACTURES): Vit D, 25-Hydroxy: 121 ng/mL — ABNORMAL HIGH (ref 30–100)

## 2020-07-18 LAB — HEMOGLOBIN A1C
Hgb A1c MFr Bld: 5.4 % of total Hgb (ref <5.7)
Mean Plasma Glucose: 108 mg/dL
eAG (mmol/L): 6 mmol/L

## 2020-07-18 LAB — INSULIN, RANDOM: Insulin: 22.9 u[IU]/mL — ABNORMAL HIGH

## 2020-07-18 LAB — MAGNESIUM: Magnesium: 1.9 mg/dL (ref 1.5–2.5)

## 2020-07-18 NOTE — Progress Notes (Signed)
============================================================ - Test results slightly outside the reference range are not unusual. If there is anything important, I will review this with you,  otherwise it is considered normal test values.  If you have further questions,  please do not hesitate to contact me at the office or via My Chart.  ============================================================ ============================================================  -  Total Chol = 170 and LDL = 91 - Great   - Very low risk for Heart Attack  / Stroke ========================================================  - Triglycerides (   370   ) or fats in blood are too high  (goal is less than 150)    - Recommend avoid fried & greasy foods,  sweets / candy,   - Avoid white rice  (brown or wild rice or Quinoa is OK),   - Avoid white potatoes  (sweet potatoes are OK)   - Avoid anything made from white flour  - bagels, doughnuts, rolls, buns, biscuits, white and   wheat breads, pizza crust and traditional  pasta made of white flour & egg white  - (vegetarian pasta or spinach or wheat pasta is OK).    - Multi-grain bread is OK - like multi-grain flat bread or  sandwich thins.   - Avoid alcohol in excess.   - Exercise is also important. ============================================================ ============================================================  -  A1c = Normal - Great - No Diabetes ! ============================================================ ============================================================  -  Vitamin D = 121 - Elevated   (Ideal or goal is between 70-100),   So recommend that you cut down your dose from                                                                                                                                                                                          30,000 u to only 20,000 units /day                                                                                                                                                   ============================================================ ============================================================  -  All Else - CBC - Kidneys - Electrolytes - Liver - Magnesium & Thyroid    - all  Normal / OK ============================================================= =============================================================

## 2020-07-19 ENCOUNTER — Telehealth: Payer: Self-pay | Admitting: *Deleted

## 2020-07-19 NOTE — Telephone Encounter (Signed)
Reached out to pharmacy to see if patient had followed up in ordering Dupixent to restart.  Per pharmacy patient received Rx on 07/12/20- FYI

## 2020-07-21 NOTE — Telephone Encounter (Signed)
Great! Thanks for the update! Does he give injections at home? I do not recall.   [name], MD [facility]

## 2020-07-23 ENCOUNTER — Other Ambulatory Visit: Payer: Self-pay

## 2020-07-30 ENCOUNTER — Encounter: Payer: Self-pay | Admitting: Internal Medicine

## 2020-08-04 ENCOUNTER — Other Ambulatory Visit: Payer: Self-pay | Admitting: Internal Medicine

## 2020-08-09 ENCOUNTER — Encounter: Payer: Self-pay | Admitting: Surgery

## 2020-08-09 ENCOUNTER — Other Ambulatory Visit: Payer: Self-pay

## 2020-08-09 ENCOUNTER — Ambulatory Visit (INDEPENDENT_AMBULATORY_CARE_PROVIDER_SITE_OTHER): Payer: BC Managed Care – PPO | Admitting: Surgery

## 2020-08-09 VITALS — BP 131/88 | HR 118

## 2020-08-09 DIAGNOSIS — M5010 Cervical disc disorder with radiculopathy, unspecified cervical region: Secondary | ICD-10-CM

## 2020-08-09 NOTE — Progress Notes (Signed)
60 year old white male history of left C6-7 and C7-T1 HNP comes in for preop evaluation.  Continues have ongoing neck pain and upper extremity radiculopathy.  He is wanting to proceed with left C6-7 and C7-T1 foraminotomies and excision of disc herniation at left C7-T1 as scheduled.  Preop clearance not requested.  Today history physical performed.  Review of systems positive for diarrhea secondary to IBS and patient also admits to having an overactive bladder.  All questions were answered.

## 2020-08-14 DIAGNOSIS — J9611 Chronic respiratory failure with hypoxia: Secondary | ICD-10-CM | POA: Diagnosis not present

## 2020-08-14 DIAGNOSIS — J449 Chronic obstructive pulmonary disease, unspecified: Secondary | ICD-10-CM | POA: Diagnosis not present

## 2020-08-14 NOTE — H&P (Signed)
Mark Harrell is an 60 y.o. male.    Chief Complaint:  60 year old white male history of left C6-7 and C7-T1 HNP comes in for preop evaluation.  Continues have ongoing neck pain and upper extremity radiculopathy.  He is wanting to proceed with left C6-7 and C7-T1 foraminotomies and excision of disc herniation at left C7-T1 as scheduled.  HPI:  60 year old white male history of left C6-7 and C7-T1 HNP comes in for preop evaluation.  Continues have ongoing neck pain and upper extremity radiculopathy.  He is wanting to proceed with left C6-7 and C7-T1 foraminotomies and excision of disc herniation at left C7-T1 as scheduled.  Past Medical History:  Diagnosis Date  . Anesthesia complication requiring reversal agent administration    ? from central apnea, very difficult to get off vent  . Anxiety   . Arthritis    osteo  . Asthma   . BPH (benign prostatic hyperplasia)   . Complication of anesthesia    difficulty waking , they twlight me because of my respiratory problems "  . Depression   . Dyspnea    on exertion  . Enlarged heart   . Family history of adverse reaction to anesthesia    mother trouble waking up, and heart stopped  . GERD (gastroesophageal reflux disease)   . Headache    botox injections for headaches  . Hyperlipidemia   . Hypertension   . Hypogonadism male   . IBS (irritable bowel syndrome)   . Memory difficulties    short term memories  . Obesity   . On home oxygen therapy    on 2 liter  . OSA (obstructive sleep apnea)    not using cpap  . Pneumonia   . Pre-diabetes   . Prostatitis     Past Surgical History:  Procedure Laterality Date  . ABDOMINAL SURGERY    . ANKLE FRACTURE SURGERY Right   . ANTERIOR CERVICAL DECOMP/DISCECTOMY FUSION N/A 05/23/2019   Procedure: ANTERIOR CERVICAL DISCECTOMY FUSION CERVICAL FIVE THROUGH CERVICAL SIX AND CERVICAL SIX THROUGH CERVICAL SEVEN;  Surgeon: Jessy Oto, MD;  Location: [facility];  Service: Orthopedics;  Laterality:  N/A;  . COLONOSCOPY    . CYSTOSCOPY     [place]  . KNEE ARTHROSCOPY WITH MEDIAL MENISECTOMY Left 01/02/2017   Procedure: LEFT KNEE ARTHROSCOPY WITH PARTIAL MEDIAL MENISCECTOMY;  Surgeon: Mcarthur Rossetti, MD;  Location: [facility];  Service: Orthopedics;  Laterality: Left;  . TONSILLECTOMY    . [place] RESECTION  2007  . UVULOPALATOPHARYNGOPLASTY      Family History  Problem Relation Age of Onset  . Diabetes Paternal Uncle   . Cancer Father        lymphoma, colon  . Diabetes Maternal Grandmother   . Heart disease Maternal Grandfather   . Diabetes Maternal Grandfather   . Diabetes Paternal Grandmother   . Diabetes Paternal Grandfather   . Dementia Mother   . Prostate cancer Maternal Uncle   . Lung disease Neg Hx   . Rheumatologic disease Neg Hx    Social History:  reports that he has quit smoking. He has a 1.50 pack-year smoking history. He has never used smokeless tobacco. He reports current alcohol use of about 1.0 standard drink of alcohol per week. He reports that he does not use drugs.  Allergies:  Allergies  Allergen Reactions  . Bee Venom Swelling  . Duloxetine Shortness Of Breath    Brought on asthma  . Ppd [Tuberculin Purified Protein Derivative] Other (See  Comments)    +ppd NEG Quantferron Gold 3/13 (shows false positive)   . Fenofibrate Other (See Comments)    Back pain  . Verapamil Other (See Comments)    Back pain  . Claritin [Loratadine] Other (See Comments)    UNKNOWN   . Levofloxacin Diarrhea  . Other Diarrhea    "Some antibiotics" cause diarrhea    No medications prior to admission.    No results found for this or any previous visit (from the past 48 hour(s)). No results found.  Review of Systems  Constitutional: Positive for activity change.  HENT: Negative.   Respiratory: Negative.   Cardiovascular: Negative.   Gastrointestinal: Positive for diarrhea (admits hx IBS).  Genitourinary: Positive for frequency (admits overactive bladder).   Musculoskeletal: Positive for neck pain and neck stiffness.  Neurological: Positive for numbness.  Psychiatric/Behavioral: Negative.     There were no vitals taken for this visit. Physical Exam HENT:     Head: Normocephalic and atraumatic.     Nose: Nose normal.  Eyes:     Extraocular Movements: Extraocular movements intact.  Cardiovascular:     Rate and Rhythm: Regular rhythm.  Pulmonary:     Effort: No respiratory distress.  Musculoskeletal:        General: Tenderness present.  Neurological:     Mental Status: He is alert and oriented to person, place, and time.  Psychiatric:        Behavior: Behavior normal.      Assessment/Plan Left C6-7 and C7-T1 stenosis/HNP   We will proceed with left C6-7 and C7-T1 foraminotomies with excision of disc herniation left C7-T1 as scheduled.  Surgical procedure discussed and all questions answered.  Benjiman Core, PA-C 08/14/2020, 3:39 PM

## 2020-08-16 NOTE — Progress Notes (Signed)
Surgical Instructions    Your procedure is scheduled on Monday, March 28th, 2022  Report to [facility] Main Entrance "A" at 05:30 A.M., then check in with the Admitting office.  Call this number if you have problems the morning of surgery:  [phone]   If you have any questions prior to your surgery date call [phone]: Open Monday-Friday 8am-4pm    Remember:  Do not eat after midnight the night before your surgery  You may drink clear liquids until 04:30 the morning of your surgery.   Clear liquids allowed are: Water, Non-Citrus Juices (without pulp), Carbonated Beverages, Clear Tea, Black Coffee Only, and Gatorade   Patient Instructions   . The day of surgery (if you have diabetes): o Drink ONE (1) 10 oz water bottle given to you in your pre admission testing appointment by 04:30am the morning of surgery. Drink in one sitting. Do not sip.  o This drink was given to you during your hospital  pre-op appointment visit.  o Nothing else to drink after completing the  4 oz bottle of water.          If you have questions, please contact your surgeon's office.   Take these medicines the morning of surgery with A SIP OF WATER   azelastine (ASTELIN) cyclobenzaprine (FLEXERIL cetirizine (ZYRTEC) donepezil (ARICEPT) doxycycline (VIBRAMYCIN) fluticasone (FLONASE) Vilant (TRELEGY ELLIPTA) Guaifenesin memantine (NAMENDA)  pregabalin (LYRICA)  sertraline (ZOLOFT) anastrozole (ARIMIDEX) Eluxadoline    If needed  acetaminophen (TYLENOL) albuterol (VENTOLIN HFA) - bring the inhaler with you EPINEPHrine ipratropium-albuterol (DUONEB) - bring the inhaler with you naloxone (NARCAN) nasal spray ondansetron (ZOFRAN-ODT)  promethazine-dextromethorphan (PROMETHAZINE-DM) sodium chloride (OCEAN) traMADol (ULTRAM)  As of today, STOP taking any Aspirin (unless otherwise instructed by your surgeon) Aleve, Naproxen, Ibuprofen, Motrin, Advil, Goody's, BC's, all herbal medications,  fish oil, and all vitamins.             WHAT DO I DO ABOUT MY DIABETES MEDICATION?   [name] Do not take oral diabetes medicines (pills) the morning of surgery. Please DO NOT TAKE Metformin the day of surgery    HOW TO MANAGE YOUR DIABETES BEFORE AND AFTER SURGERY  Why is it important to control my blood sugar before and after surgery? . Improving blood sugar levels before and after surgery helps healing and can limit problems. . A way of improving blood sugar control is eating a healthy diet by: o  Eating less sugar and carbohydrates o  Increasing activity/exercise o  Talking with your doctor about reaching your blood sugar goals . High blood sugars (greater than 180 mg/dL) can raise your risk of infections and slow your recovery, so you will need to focus on controlling your diabetes during the weeks before surgery. . Make sure that the doctor who takes care of your diabetes knows about your planned surgery including the date and location.  How do I manage my blood sugar before surgery? . Check your blood sugar at least 4 times a day, starting 2 days before surgery, to make sure that the level is not too high or low. . Check your blood sugar the morning of your surgery when you wake up and every 2 hours until you get to the Short Stay unit. o If your blood sugar is less than 70 mg/dL, you will need to treat for low blood sugar: - Do not take insulin. - Treat a low blood sugar (less than 70 mg/dL) with  cup of clear juice (cranberry or apple),  4 glucose tablets, OR glucose gel. - Recheck blood sugar in 15 minutes after treatment (to make sure it is greater than 70 mg/dL). If your blood sugar is not greater than 70 mg/dL on recheck, call [phone] for further instructions. . Report your blood sugar to the short stay nurse when you get to Short Stay.  . If you are admitted to the hospital after surgery: o Your blood sugar will be checked by the staff and you will probably be given  insulin after surgery (instead of oral diabetes medicines) to make sure you have good blood sugar levels. o The goal for blood sugar control after surgery is 80-180 mg/dL.              Do not wear jewelry            Do not wear lotions, powders, perfumes/colognes, or deodorant.            Men may shave face and neck.            Do not bring valuables to the hospital.            [facility] is not responsible for any belongings or valuables.  Do NOT Smoke (Tobacco/Vaping) or drink Alcohol 24 hours prior to your procedure If you use a CPAP at night, you may bring all equipment for your overnight stay.   Contacts, glasses, dentures or bridgework may not be worn into surgery, please bring cases for these belongings   For patients admitted to the hospital, discharge time will be determined by your treatment team.   Patients discharged the day of surgery will not be allowed to drive home, and someone needs to stay with them for 24 hours.    Special instructions:   [place]- Preparing For Surgery  Before surgery, you can play an important role. Because skin is not sterile, your skin needs to be as free of germs as possible. You can reduce the number of germs on your skin by washing with CHG (chlorahexidine gluconate) Soap before surgery.  CHG is an antiseptic cleaner which kills germs and bonds with the skin to continue killing germs even after washing.    Oral Hygiene is also important to reduce your risk of infection.  Remember - BRUSH YOUR TEETH THE MORNING OF SURGERY WITH YOUR REGULAR TOOTHPASTE  Please do not use if you have an allergy to CHG or antibacterial soaps. If your skin becomes reddened/irritated stop using the CHG.  Do not shave (including legs and underarms) for at least 48 hours prior to first CHG shower. It is OK to shave your face.  Please follow these instructions carefully.   1. Shower the NIGHT BEFORE SURGERY and the MORNING OF SURGERY  2. If you chose to wash  your hair, wash your hair first as usual with your normal shampoo.  3. After you shampoo, rinse your hair and body thoroughly to remove the shampoo.   4. Use CHG Soap as you would any other liquid soap. You can apply CHG directly to the skin and wash gently with a scrungie or a clean washcloth.   5. Apply the CHG Soap to your body ONLY FROM THE NECK DOWN.  Do not use on open wounds or open sores. Avoid contact with your eyes, ears, mouth and genitals (private parts). Wash Face and genitals (private parts)  with your normal soap.   6. Wash thoroughly, paying special attention to the area where your surgery will be performed.  7. Thoroughly rinse your body with warm water from the neck down.  8. DO NOT shower/wash with your normal soap after using and rinsing off the CHG Soap.  9. Pat yourself dry with a CLEAN TOWEL.  10. Wear CLEAN PAJAMAS to bed the night before surgery  11. Place CLEAN SHEETS on your bed the night before your surgery  12. DO NOT SLEEP WITH PETS.   Day of Surgery: Shower with CHG soap Wear Clean/Comfortable clothing the morning of surgery Do not apply any deodorants/lotions.   Remember to brush your teeth WITH YOUR REGULAR TOOTHPASTE.   Please read over the following fact sheets that you were given.

## 2020-08-17 ENCOUNTER — Encounter (HOSPITAL_COMMUNITY): Payer: Self-pay

## 2020-08-17 ENCOUNTER — Encounter (HOSPITAL_COMMUNITY)
Admission: RE | Admit: 2020-08-17 | Discharge: 2020-08-17 | Disposition: A | Discharge status: Home / Self Care | Payer: BC Managed Care – PPO | Source: Ambulatory Visit | Attending: Surgery | Admitting: Surgery

## 2020-08-17 ENCOUNTER — Encounter (HOSPITAL_COMMUNITY)
Admission: RE | Admit: 2020-08-17 | Discharge: 2020-08-17 | Disposition: A | Discharge status: Home / Self Care | Payer: BC Managed Care – PPO | Source: Ambulatory Visit | Attending: Specialist | Admitting: Specialist

## 2020-08-17 ENCOUNTER — Other Ambulatory Visit: Payer: Self-pay

## 2020-08-17 DIAGNOSIS — R413 Other amnesia: Secondary | ICD-10-CM | POA: Diagnosis not present

## 2020-08-17 DIAGNOSIS — Z9981 Dependence on supplemental oxygen: Secondary | ICD-10-CM | POA: Insufficient documentation

## 2020-08-17 DIAGNOSIS — G4733 Obstructive sleep apnea (adult) (pediatric): Secondary | ICD-10-CM | POA: Insufficient documentation

## 2020-08-17 DIAGNOSIS — Z79891 Long term (current) use of opiate analgesic: Secondary | ICD-10-CM | POA: Diagnosis not present

## 2020-08-17 DIAGNOSIS — I517 Cardiomegaly: Secondary | ICD-10-CM | POA: Diagnosis not present

## 2020-08-17 DIAGNOSIS — Z6841 Body Mass Index (BMI) 40.0 and over, adult: Secondary | ICD-10-CM | POA: Diagnosis not present

## 2020-08-17 DIAGNOSIS — K589 Irritable bowel syndrome without diarrhea: Secondary | ICD-10-CM | POA: Insufficient documentation

## 2020-08-17 DIAGNOSIS — R7303 Prediabetes: Secondary | ICD-10-CM | POA: Insufficient documentation

## 2020-08-17 DIAGNOSIS — S06899S Other specified intracranial injury with loss of consciousness of unspecified duration, sequela: Secondary | ICD-10-CM | POA: Diagnosis not present

## 2020-08-17 DIAGNOSIS — N400 Enlarged prostate without lower urinary tract symptoms: Secondary | ICD-10-CM | POA: Insufficient documentation

## 2020-08-17 DIAGNOSIS — Z79899 Other long term (current) drug therapy: Secondary | ICD-10-CM | POA: Diagnosis not present

## 2020-08-17 DIAGNOSIS — Z20822 Contact with and (suspected) exposure to covid-19: Secondary | ICD-10-CM | POA: Insufficient documentation

## 2020-08-17 DIAGNOSIS — K219 Gastro-esophageal reflux disease without esophagitis: Secondary | ICD-10-CM | POA: Insufficient documentation

## 2020-08-17 DIAGNOSIS — Z791 Long term (current) use of non-steroidal anti-inflammatories (NSAID): Secondary | ICD-10-CM | POA: Insufficient documentation

## 2020-08-17 DIAGNOSIS — Z7951 Long term (current) use of inhaled steroids: Secondary | ICD-10-CM | POA: Insufficient documentation

## 2020-08-17 DIAGNOSIS — R1310 Dysphagia, unspecified: Secondary | ICD-10-CM | POA: Diagnosis not present

## 2020-08-17 DIAGNOSIS — E785 Hyperlipidemia, unspecified: Secondary | ICD-10-CM | POA: Insufficient documentation

## 2020-08-17 DIAGNOSIS — Z981 Arthrodesis status: Secondary | ICD-10-CM | POA: Diagnosis not present

## 2020-08-17 DIAGNOSIS — M4802 Spinal stenosis, cervical region: Secondary | ICD-10-CM | POA: Diagnosis not present

## 2020-08-17 DIAGNOSIS — Z7984 Long term (current) use of oral hypoglycemic drugs: Secondary | ICD-10-CM | POA: Insufficient documentation

## 2020-08-17 DIAGNOSIS — I10 Essential (primary) hypertension: Secondary | ICD-10-CM | POA: Diagnosis not present

## 2020-08-17 DIAGNOSIS — Z01818 Encounter for other preprocedural examination: Secondary | ICD-10-CM

## 2020-08-17 DIAGNOSIS — J449 Chronic obstructive pulmonary disease, unspecified: Secondary | ICD-10-CM | POA: Diagnosis not present

## 2020-08-17 HISTORY — DX: Paralytic syndrome, unspecified: G83.9

## 2020-08-17 HISTORY — DX: Polyneuropathy, unspecified: G62.9

## 2020-08-17 LAB — CBC
HCT: 53.3 % — ABNORMAL HIGH (ref 39.0–52.0)
Hemoglobin: 16.9 g/dL (ref 13.0–17.0)
MCH: 29.5 pg (ref 26.0–34.0)
MCHC: 31.7 g/dL (ref 30.0–36.0)
MCV: 93 fL (ref 80.0–100.0)
Platelets: 292 10*3/uL (ref 150–400)
RBC: 5.73 MIL/uL (ref 4.22–5.81)
RDW: 13.3 % (ref 11.5–15.5)
WBC: 10.2 10*3/uL (ref 4.0–10.5)
nRBC: 0 % (ref 0.0–0.2)

## 2020-08-17 LAB — COMPREHENSIVE METABOLIC PANEL
ALT: 28 U/L (ref 0–44)
AST: 22 U/L (ref 15–41)
Albumin: 3.7 g/dL (ref 3.5–5.0)
Alkaline Phosphatase: 59 U/L (ref 38–126)
Anion gap: 5 (ref 5–15)
BUN: 15 mg/dL (ref 6–20)
CO2: 30 mmol/L (ref 22–32)
Calcium: 9.4 mg/dL (ref 8.9–10.3)
Chloride: 104 mmol/L (ref 98–111)
Creatinine, Ser: 0.93 mg/dL (ref 0.61–1.24)
GFR, Estimated: >60 mL/min (ref >60)
Glucose, Bld: 103 mg/dL — ABNORMAL HIGH (ref 70–99)
Potassium: 4.3 mmol/L (ref 3.5–5.1)
Sodium: 139 mmol/L (ref 135–145)
Total Bilirubin: 1.1 mg/dL (ref 0.3–1.2)
Total Protein: 6.4 g/dL — ABNORMAL LOW (ref 6.5–8.1)

## 2020-08-17 LAB — SARS CORONAVIRUS 2 (TAT 6-24 HRS): SARS Coronavirus 2: NEGATIVE

## 2020-08-17 LAB — PROTIME-INR
INR: 1.1 (ref 0.8–1.2)
Prothrombin Time: 13.3 seconds (ref 11.4–15.2)

## 2020-08-17 LAB — HEMOGLOBIN A1C
Hgb A1c MFr Bld: 5.7 % — ABNORMAL HIGH (ref 4.8–5.6)
Mean Plasma Glucose: 116.89 mg/dL

## 2020-08-17 LAB — SURGICAL PCR SCREEN
MRSA, PCR: NEGATIVE
Staphylococcus aureus: NEGATIVE

## 2020-08-17 MED ORDER — DEXTROSE 5 % IV SOLN
3.0000 g | INTRAVENOUS | Status: AC
  Administered 2020-08-20: 3 g via INTRAVENOUS
  Filled 2020-08-17 (×3): qty 3000
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
## 2020-08-17 NOTE — Progress Notes (Signed)
Abnormal HCT in PAT - 53.3. Sherrie in Dr. [name] office was informed.

## 2020-08-17 NOTE — Anesthesia Preprocedure Evaluation (Addendum)
Anesthesia Evaluation  Patient identified by MRN, date of birth, ID band Patient awake    Reviewed: Allergy & Precautions, NPO status , Patient's Chart, lab work & pertinent test results, reviewed documented beta blocker date and time   History of Anesthesia Complications Negative for: history of anesthetic complications  Airway Mallampati: II  TM Distance: >3 FB Neck ROM: Full    Dental no notable dental hx.    Pulmonary shortness of breath and with exertion, asthma , sleep apnea , COPD,  oxygen dependent, former smoker,  tracheomalacia   Pulmonary exam normal        Cardiovascular hypertension, Pt. on medications and Pt. on home beta blockers Normal cardiovascular exam  Echo 02/23/17: EF 60-65%, grade 1 DD, mild LAE    Neuro/Psych  Headaches, Seizures -,  Anxiety Depression Dementia    GI/Hepatic Neg liver ROS, GERD  ,  Endo/Other  Morbid obesity (BMI 47)  Renal/GU negative Renal ROS  negative genitourinary   Musculoskeletal  (+) Arthritis , left C7-T1 Disc herniation, left C6-7 foraminal stenosis   Abdominal   Peds  Hematology negative hematology ROS (+)   Anesthesia Other Findings Day of surgery medications reviewed with patient.  Reproductive/Obstetrics negative OB ROS                           Anesthesia Physical Anesthesia Plan  ASA: IV  Anesthesia Plan: General   Post-op Pain Management:    Induction: Intravenous  PONV Risk Score and Plan: 3 and Treatment may vary due to age or medical condition, Ondansetron, Dexamethasone and Midazolam  Airway Management Planned: Oral ETT  Additional Equipment: None  Intra-op Plan:   Post-operative Plan: Extubation in OR  Informed Consent: I have reviewed the patients History and Physical, chart, labs and discussed the procedure including the risks, benefits and alternatives for the proposed anesthesia with the patient or authorized  representative who has indicated his/her understanding and acceptance.     Dental advisory given  Plan Discussed with: CRNA  Anesthesia Plan Comments: (See PAT note written 08/17/2020 by Myra Gianotti, PA-C. )      Anesthesia Quick Evaluation

## 2020-08-17 NOTE — Progress Notes (Signed)
PCP - Dr. [name] Cardiologist - Dr. Quay Burow Pulmonologist - Dr. [name]  PPM/ICD - denies Device Orders - denies Rep Notified - denies  Chest x-ray - 08/17/2020 EKG - 08/17/2020 Stress Test - denies ECHO - 02/23/17 Cardiac Cath - denies  Sleep Study - yes, positive CPAP - no   Blood Thinner Instructions: n/a Aspirin Instructions: Patient was instructed to stop taking any Aspirin (unless otherwise instructed by your surgeon) Aleve, Naproxen, Ibuprofen, Motrin, Advil, Goody's, BC's, all herbal medications, fish oil, and all vitamins  ERAS Protcol - yes;   PRE-SURGERY: 10 oz water  COVID TEST- 08/17/2020   Anesthesia review: yes. Bryson Ha, PA-C notified and she reviewed the chart while patient was in PAT.  Patient denies shortness of breath, fever, cough and chest pain at PAT appointment   All instructions explained to the patient, with a verbal understanding of the material. Patient agrees to go over the instructions while at home for a better understanding. Patient also instructed to self quarantine after being tested for COVID-19. The opportunity to ask questions was provided.

## 2020-08-17 NOTE — Progress Notes (Signed)
Surgical Instructions    Your procedure is scheduled on Monday, March 28th, 2022  Report to [facility] Main Entrance "A" at 05:30 A.M., then check in with the Admitting office.  Call this number if you have problems the morning of surgery:  [phone]   If you have any questions prior to your surgery date call [phone]: Open Monday-Friday 8am-4pm    Remember:  Do not eat after midnight the night before your surgery  You may drink clear liquids until 04:30 the morning of your surgery.   Clear liquids allowed are: Water, Non-Citrus Juices (without pulp), Carbonated Beverages, Clear Tea, Black Coffee Only, and Gatorade   Patient Instructions   . The day of surgery (if you have diabetes): o Drink ONE (1) 10 oz water bottle given to you in your pre admission testing appointment by 04:30am the morning of surgery. Drink in one sitting. Do not sip.  o This drink was given to you during your hospital  pre-op appointment visit.  o Nothing else to drink after completing the  4 oz bottle of water.          If you have questions, please contact your surgeon's office.   Take these medicines the morning of surgery with A SIP OF WATER   azelastine (ASTELIN) cyclobenzaprine (FLEXERIL cetirizine (ZYRTEC) donepezil (ARICEPT) doxycycline (VIBRAMYCIN) fluticasone (FLONASE) Vilant (TRELEGY ELLIPTA) Guaifenesin memantine (NAMENDA)  pregabalin (LYRICA)  sertraline (ZOLOFT) anastrozole (ARIMIDEX)    If needed  acetaminophen (TYLENOL) albuterol (VENTOLIN HFA) - bring the inhaler with you EPINEPHrine ipratropium-albuterol (DUONEB) - bring the inhaler with you naloxone (NARCAN) nasal spray ondansetron (ZOFRAN-ODT)  promethazine-dextromethorphan (PROMETHAZINE-DM) sodium chloride (OCEAN) traMADol (ULTRAM)  As of today, STOP taking any Aspirin (unless otherwise instructed by your surgeon) Aleve, Naproxen, Ibuprofen, Motrin, Advil, Goody's, BC's, all herbal medications, fish oil, and  all vitamins.             WHAT DO I DO ABOUT MY DIABETES MEDICATION?   [name] Do not take oral diabetes medicines (pills) the morning of surgery. Please DO NOT TAKE Metformin the day of surgery    HOW TO MANAGE YOUR DIABETES BEFORE AND AFTER SURGERY  Why is it important to control my blood sugar before and after surgery? . Improving blood sugar levels before and after surgery helps healing and can limit problems. . A way of improving blood sugar control is eating a healthy diet by: o  Eating less sugar and carbohydrates o  Increasing activity/exercise o  Talking with your doctor about reaching your blood sugar goals . High blood sugars (greater than 180 mg/dL) can raise your risk of infections and slow your recovery, so you will need to focus on controlling your diabetes during the weeks before surgery. . Make sure that the doctor who takes care of your diabetes knows about your planned surgery including the date and location.  How do I manage my blood sugar before surgery? . Check your blood sugar at least 4 times a day, starting 2 days before surgery, to make sure that the level is not too high or low. . Check your blood sugar the morning of your surgery when you wake up and every 2 hours until you get to the Short Stay unit. o If your blood sugar is less than 70 mg/dL, you will need to treat for low blood sugar: - Do not take insulin. - Treat a low blood sugar (less than 70 mg/dL) with  cup of clear juice (cranberry or apple), 4  glucose tablets, OR glucose gel. - Recheck blood sugar in 15 minutes after treatment (to make sure it is greater than 70 mg/dL). If your blood sugar is not greater than 70 mg/dL on recheck, call [phone] for further instructions. . Report your blood sugar to the short stay nurse when you get to Short Stay.  . If you are admitted to the hospital after surgery: o Your blood sugar will be checked by the staff and you will probably be given insulin after  surgery (instead of oral diabetes medicines) to make sure you have good blood sugar levels. o The goal for blood sugar control after surgery is 80-180 mg/dL.              Do not wear jewelry            Do not wear lotions, powders, perfumes/colognes, or deodorant.            Men may shave face and neck.            Do not bring valuables to the hospital.            [facility] is not responsible for any belongings or valuables.  Do NOT Smoke (Tobacco/Vaping) or drink Alcohol 24 hours prior to your procedure If you use a CPAP at night, you may bring all equipment for your overnight stay.   Contacts, glasses, dentures or bridgework may not be worn into surgery, please bring cases for these belongings   For patients admitted to the hospital, discharge time will be determined by your treatment team.   Patients discharged the day of surgery will not be allowed to drive home, and someone needs to stay with them for 24 hours.    Special instructions:   [facility]- Preparing For Surgery  Before surgery, you can play an important role. Because skin is not sterile, your skin needs to be as free of germs as possible. You can reduce the number of germs on your skin by washing with CHG (chlorahexidine gluconate) Soap before surgery.  CHG is an antiseptic cleaner which kills germs and bonds with the skin to continue killing germs even after washing.    Oral Hygiene is also important to reduce your risk of infection.  Remember - BRUSH YOUR TEETH THE MORNING OF SURGERY WITH YOUR REGULAR TOOTHPASTE  Please do not use if you have an allergy to CHG or antibacterial soaps. If your skin becomes reddened/irritated stop using the CHG.  Do not shave (including legs and underarms) for at least 48 hours prior to first CHG shower. It is OK to shave your face.  Please follow these instructions carefully.   1. Shower the NIGHT BEFORE SURGERY and the MORNING OF SURGERY  2. If you chose to wash your hair, wash  your hair first as usual with your normal shampoo.  3. After you shampoo, rinse your hair and body thoroughly to remove the shampoo.   4. Use CHG Soap as you would any other liquid soap. You can apply CHG directly to the skin and wash gently with a scrungie or a clean washcloth.   5. Apply the CHG Soap to your body ONLY FROM THE NECK DOWN.  Do not use on open wounds or open sores. Avoid contact with your eyes, ears, mouth and genitals (private parts). Wash Face and genitals (private parts)  with your normal soap.   6. Wash thoroughly, paying special attention to the area where your surgery will be performed.  7. Thoroughly rinse your body with warm water from the neck down.  8. DO NOT shower/wash with your normal soap after using and rinsing off the CHG Soap.  9. Pat yourself dry with a CLEAN TOWEL.  10. Wear CLEAN PAJAMAS to bed the night before surgery  11. Place CLEAN SHEETS on your bed the night before your surgery  12. DO NOT SLEEP WITH PETS.   Day of Surgery: Shower with CHG soap Wear Clean/Comfortable clothing the morning of surgery Do not apply any deodorants/lotions.   Remember to brush your teeth WITH YOUR REGULAR TOOTHPASTE.   Please read over the following fact sheets that you were given.

## 2020-08-20 ENCOUNTER — Ambulatory Visit (HOSPITAL_COMMUNITY): Payer: BC Managed Care – PPO

## 2020-08-20 ENCOUNTER — Ambulatory Visit (HOSPITAL_COMMUNITY)
Admission: RE | Admit: 2020-08-20 | Discharge: 2020-08-21 | Disposition: A | Discharge status: Home / Self Care | Payer: BC Managed Care – PPO | Attending: Specialist | Admitting: Specialist

## 2020-08-20 ENCOUNTER — Ambulatory Visit (HOSPITAL_COMMUNITY): Payer: BC Managed Care – PPO | Admitting: Anesthesiology

## 2020-08-20 ENCOUNTER — Ambulatory Visit (HOSPITAL_COMMUNITY): Payer: BC Managed Care – PPO | Admitting: Vascular Surgery

## 2020-08-20 ENCOUNTER — Other Ambulatory Visit: Payer: Self-pay

## 2020-08-20 ENCOUNTER — Encounter (HOSPITAL_COMMUNITY): Payer: Self-pay | Admitting: Specialist

## 2020-08-20 ENCOUNTER — Encounter (HOSPITAL_COMMUNITY)
Admission: RE | Disposition: A | Discharge status: Home / Self Care | Payer: Self-pay | Source: Home / Self Care | Attending: Specialist

## 2020-08-20 DIAGNOSIS — M5124 Other intervertebral disc displacement, thoracic region: Secondary | ICD-10-CM | POA: Insufficient documentation

## 2020-08-20 DIAGNOSIS — M4802 Spinal stenosis, cervical region: Secondary | ICD-10-CM | POA: Diagnosis not present

## 2020-08-20 DIAGNOSIS — M4722 Other spondylosis with radiculopathy, cervical region: Secondary | ICD-10-CM

## 2020-08-20 DIAGNOSIS — M5010 Cervical disc disorder with radiculopathy, unspecified cervical region: Secondary | ICD-10-CM | POA: Diagnosis not present

## 2020-08-20 DIAGNOSIS — M50223 Other cervical disc displacement at C6-C7 level: Secondary | ICD-10-CM | POA: Insufficient documentation

## 2020-08-20 DIAGNOSIS — I10 Essential (primary) hypertension: Secondary | ICD-10-CM | POA: Diagnosis not present

## 2020-08-20 DIAGNOSIS — M4803 Spinal stenosis, cervicothoracic region: Secondary | ICD-10-CM | POA: Insufficient documentation

## 2020-08-20 DIAGNOSIS — Z87891 Personal history of nicotine dependence: Secondary | ICD-10-CM | POA: Diagnosis not present

## 2020-08-20 DIAGNOSIS — Z881 Allergy status to other antibiotic agents status: Secondary | ICD-10-CM | POA: Diagnosis not present

## 2020-08-20 DIAGNOSIS — M5003 Cervical disc disorder with myelopathy, cervicothoracic region: Secondary | ICD-10-CM | POA: Diagnosis not present

## 2020-08-20 DIAGNOSIS — Z9889 Other specified postprocedural states: Secondary | ICD-10-CM

## 2020-08-20 DIAGNOSIS — Z419 Encounter for procedure for purposes other than remedying health state, unspecified: Secondary | ICD-10-CM

## 2020-08-20 HISTORY — PX: POSTERIOR CERVICAL FUSION/FORAMINOTOMY: SHX5038

## 2020-08-20 LAB — GLUCOSE, CAPILLARY
Glucose-Capillary: 85 mg/dL (ref 70–99)
Glucose-Capillary: 166 mg/dL — ABNORMAL HIGH (ref 70–99)

## 2020-08-20 SURGERY — POSTERIOR CERVICAL FUSION/FORAMINOTOMY LEVEL 2
Anesthesia: General | Site: Neck

## 2020-08-20 MED ORDER — L-METHYLFOLATE-B6-B12 3-35-2 MG PO TABS
1.0000 | ORAL_TABLET | Freq: Every day | ORAL | Status: DC
  Administered 2020-08-21: 1 via ORAL
  Filled 2020-08-20: qty 1

## 2020-08-20 MED ORDER — VITAMIN D 25 MCG (1000 UNIT) PO TABS
15000.0000 [IU] | ORAL_TABLET | Freq: Every morning | ORAL | Status: DC
  Administered 2020-08-21: 15000 [IU] via ORAL
  Filled 2020-08-20: qty 15

## 2020-08-20 MED ORDER — DEXAMETHASONE SODIUM PHOSPHATE 4 MG/ML IJ SOLN
4.0000 mg | Freq: Four times a day (QID) | INTRAMUSCULAR | Status: DC
  Administered 2020-08-20: 4 mg via INTRAVENOUS
  Filled 2020-08-20: qty 1

## 2020-08-20 MED ORDER — GUAIFENESIN ER 600 MG PO TB12
1200.0000 mg | ORAL_TABLET | Freq: Two times a day (BID) | ORAL | Status: DC
  Administered 2020-08-20 – 2020-08-21 (×2): 1200 mg via ORAL
  Filled 2020-08-20 (×3): qty 2

## 2020-08-20 MED ORDER — PROPOFOL 10 MG/ML IV BOLUS
INTRAVENOUS | Status: DC | PRN
  Administered 2020-08-20: 200 mg via INTRAVENOUS

## 2020-08-20 MED ORDER — ALUM & MAG HYDROXIDE-SIMETH 200-200-20 MG/5ML PO SUSP: 30.0000 mL | Freq: Four times a day (QID) | ORAL | Status: DC | PRN

## 2020-08-20 MED ORDER — VASOPRESSIN 20 UNIT/ML IV SOLN
INTRAVENOUS | Status: AC
  Filled 2020-08-20: qty 1

## 2020-08-20 MED ORDER — ALBUMIN HUMAN 5 % IV SOLN: INTRAVENOUS | Status: DC | PRN

## 2020-08-20 MED ORDER — OXYCODONE HCL 5 MG PO TABS
ORAL_TABLET | ORAL | Status: AC
  Filled 2020-08-20: qty 1

## 2020-08-20 MED ORDER — IPRATROPIUM-ALBUTEROL 0.5-2.5 (3) MG/3ML IN SOLN: 3.0000 mL | RESPIRATORY_TRACT | Status: DC | PRN

## 2020-08-20 MED ORDER — METHOCARBAMOL 500 MG PO TABS
500.0000 mg | ORAL_TABLET | Freq: Four times a day (QID) | ORAL | Status: DC | PRN
  Administered 2020-08-21 (×2): 500 mg via ORAL
  Filled 2020-08-20 (×3): qty 1

## 2020-08-20 MED ORDER — CO Q 10 60 MG PO CAPS: 600.0000 mg | ORAL_CAPSULE | Freq: Every day | ORAL | Status: DC

## 2020-08-20 MED ORDER — POLYETHYLENE GLYCOL 3350 17 G PO PACK: 17.0000 g | PACK | Freq: Every day | ORAL | Status: DC | PRN

## 2020-08-20 MED ORDER — ORAL CARE MOUTH RINSE: 15.0000 mL | Freq: Once | OROMUCOSAL | Status: AC

## 2020-08-20 MED ORDER — ACETAMINOPHEN 500 MG PO TABS: 1000.0000 mg | ORAL_TABLET | Freq: Four times a day (QID) | ORAL | Status: DC | PRN

## 2020-08-20 MED ORDER — DEXAMETHASONE SODIUM PHOSPHATE 10 MG/ML IJ SOLN
INTRAMUSCULAR | Status: AC
  Filled 2020-08-20: qty 1

## 2020-08-20 MED ORDER — BISACODYL 5 MG PO TBEC: 5.0000 mg | DELAYED_RELEASE_TABLET | Freq: Every day | ORAL | Status: DC | PRN

## 2020-08-20 MED ORDER — FUROSEMIDE 40 MG PO TABS
40.0000 mg | ORAL_TABLET | Freq: Every day | ORAL | Status: DC
  Administered 2020-08-20 – 2020-08-21 (×2): 40 mg via ORAL
  Filled 2020-08-20: qty 1

## 2020-08-20 MED ORDER — PHENYLEPHRINE 40 MCG/ML (10ML) SYRINGE FOR IV PUSH (FOR BLOOD PRESSURE SUPPORT)
PREFILLED_SYRINGE | INTRAVENOUS | Status: DC | PRN
  Administered 2020-08-20: 120 ug via INTRAVENOUS
  Administered 2020-08-20: 160 ug via INTRAVENOUS
  Administered 2020-08-20 (×2): 120 ug via INTRAVENOUS

## 2020-08-20 MED ORDER — NALOXONE HCL 4 MG/0.1ML NA LIQD: 1.0000 | NASAL | Status: DC | PRN

## 2020-08-20 MED ORDER — ROCURONIUM BROMIDE 10 MG/ML (PF) SYRINGE
PREFILLED_SYRINGE | INTRAVENOUS | Status: DC | PRN
  Administered 2020-08-20: 70 mg via INTRAVENOUS
  Administered 2020-08-20: 30 mg via INTRAVENOUS
  Administered 2020-08-20 (×2): 50 mg via INTRAVENOUS

## 2020-08-20 MED ORDER — THROMBIN 20000 UNITS EX SOLR
CUTANEOUS | Status: DC | PRN
  Administered 2020-08-20: 20000 [IU] via TOPICAL

## 2020-08-20 MED ORDER — PANTOPRAZOLE SODIUM 40 MG PO TBEC
40.0000 mg | DELAYED_RELEASE_TABLET | Freq: Every day | ORAL | Status: DC
  Administered 2020-08-20: 40 mg via ORAL
  Filled 2020-08-20: qty 1

## 2020-08-20 MED ORDER — BUPIVACAINE LIPOSOME 1.3 % IJ SUSP
INTRAMUSCULAR | Status: DC | PRN
  Administered 2020-08-20: 3.5 mL

## 2020-08-20 MED ORDER — ACETAMINOPHEN 500 MG PO TABS
1000.0000 mg | ORAL_TABLET | Freq: Once | ORAL | Status: AC
  Administered 2020-08-20: 1000 mg via ORAL
  Filled 2020-08-20: qty 2

## 2020-08-20 MED ORDER — DONEPEZIL HCL 23 MG PO TABS
23.0000 mg | ORAL_TABLET | Freq: Every day | ORAL | Status: DC
  Administered 2020-08-21: 23 mg via ORAL
  Filled 2020-08-20: qty 1

## 2020-08-20 MED ORDER — DUPILUMAB 300 MG/2ML ~~LOC~~ SOSY: 300.0000 mg | PREFILLED_SYRINGE | SUBCUTANEOUS | Status: DC

## 2020-08-20 MED ORDER — DARIFENACIN HYDROBROMIDE ER 7.5 MG PO TB24
7.5000 mg | ORAL_TABLET | Freq: Every day | ORAL | Status: DC
  Administered 2020-08-20 – 2020-08-21 (×2): 7.5 mg via ORAL
  Filled 2020-08-20 (×2): qty 1

## 2020-08-20 MED ORDER — GABAPENTIN 300 MG PO CAPS
300.0000 mg | ORAL_CAPSULE | Freq: Three times a day (TID) | ORAL | Status: DC
  Administered 2020-08-20 – 2020-08-21 (×3): 300 mg via ORAL
  Filled 2020-08-20 (×3): qty 1

## 2020-08-20 MED ORDER — ELUXADOLINE 100 MG PO TABS: 100.0000 mg | ORAL_TABLET | Freq: Two times a day (BID) | ORAL | Status: DC

## 2020-08-20 MED ORDER — FLEET ENEMA 7-19 GM/118ML RE ENEM: 1.0000 | ENEMA | Freq: Once | RECTAL | Status: DC | PRN

## 2020-08-20 MED ORDER — INSULIN ASPART 100 UNIT/ML ~~LOC~~ SOLN: 0.0000 [IU] | Freq: Three times a day (TID) | SUBCUTANEOUS | Status: DC

## 2020-08-20 MED ORDER — ZINC GLUCONATE 50 MG PO TABS: 50.0000 mg | ORAL_TABLET | Freq: Three times a day (TID) | ORAL | Status: DC

## 2020-08-20 MED ORDER — PHENYLEPHRINE HCL-NACL 10-0.9 MG/250ML-% IV SOLN
INTRAVENOUS | Status: DC | PRN
  Administered 2020-08-20: 60 ug/min via INTRAVENOUS

## 2020-08-20 MED ORDER — ONDANSETRON HCL 4 MG PO TABS: 4.0000 mg | ORAL_TABLET | Freq: Four times a day (QID) | ORAL | Status: DC | PRN

## 2020-08-20 MED ORDER — PHENOL 1.4 % MT LIQD: 1.0000 | OROMUCOSAL | Status: DC | PRN

## 2020-08-20 MED ORDER — CHLORHEXIDINE GLUCONATE 0.12 % MT SOLN
15.0000 mL | Freq: Once | OROMUCOSAL | Status: AC
  Administered 2020-08-20: 15 mL via OROMUCOSAL
  Filled 2020-08-20: qty 15

## 2020-08-20 MED ORDER — OXYMETAZOLINE HCL 0.05 % NA SOLN
1.0000 | Freq: Every day | NASAL | Status: DC
  Administered 2020-08-20: 1 via NASAL
  Filled 2020-08-20: qty 30

## 2020-08-20 MED ORDER — SODIUM CHLORIDE 0.9 % IV SOLN: INTRAVENOUS | Status: DC

## 2020-08-20 MED ORDER — GLUCOS-CHONDROIT-MSM COMPLEX PO TABS: 2.0000 | ORAL_TABLET | Freq: Three times a day (TID) | ORAL | Status: DC

## 2020-08-20 MED ORDER — DOXYCYCLINE HYCLATE 100 MG PO TABS
100.0000 mg | ORAL_TABLET | Freq: Two times a day (BID) | ORAL | Status: DC
  Administered 2020-08-20 – 2020-08-21 (×2): 100 mg via ORAL
  Filled 2020-08-20 (×3): qty 1

## 2020-08-20 MED ORDER — CELECOXIB 200 MG PO CAPS
200.0000 mg | ORAL_CAPSULE | Freq: Two times a day (BID) | ORAL | Status: DC
  Administered 2020-08-20 – 2020-08-21 (×3): 200 mg via ORAL
  Filled 2020-08-20 (×3): qty 1

## 2020-08-20 MED ORDER — DICLOFENAC SODIUM 1 % EX GEL
2.0000 g | Freq: Four times a day (QID) | CUTANEOUS | Status: DC | PRN
  Filled 2020-08-20: qty 100

## 2020-08-20 MED ORDER — PROPOFOL 10 MG/ML IV BOLUS
INTRAVENOUS | Status: AC
  Filled 2020-08-20: qty 40

## 2020-08-20 MED ORDER — SODIUM CHLORIDE 0.9 % IR SOLN
Status: DC | PRN
Start: 2020-08-20 — End: 2020-08-20
  Administered 2020-08-20: 1000 mL

## 2020-08-20 MED ORDER — BUPIVACAINE HCL 0.5 % IJ SOLN
INTRAMUSCULAR | Status: DC | PRN
  Administered 2020-08-20: 3.5 mL

## 2020-08-20 MED ORDER — CLOTRIMAZOLE 1 % EX CREA
TOPICAL_CREAM | Freq: Two times a day (BID) | CUTANEOUS | Status: DC
  Administered 2020-08-20: 1 via TOPICAL
  Filled 2020-08-20: qty 15

## 2020-08-20 MED ORDER — LIDOCAINE 2% (20 MG/ML) 5 ML SYRINGE
INTRAMUSCULAR | Status: DC | PRN
  Administered 2020-08-20: 100 mg via INTRAVENOUS

## 2020-08-20 MED ORDER — UMECLIDINIUM BROMIDE 62.5 MCG/INH IN AEPB
1.0000 | INHALATION_SPRAY | Freq: Every day | RESPIRATORY_TRACT | Status: DC
  Administered 2020-08-21: 1 via RESPIRATORY_TRACT
  Filled 2020-08-20: qty 7

## 2020-08-20 MED ORDER — ANASTROZOLE 1 MG PO TABS
1.0000 mg | ORAL_TABLET | Freq: Every day | ORAL | Status: DC
  Administered 2020-08-21: 1 mg via ORAL
  Filled 2020-08-20: qty 1

## 2020-08-20 MED ORDER — BUPIVACAINE HCL 0.5 % IJ SOLN
INTRAMUSCULAR | Status: AC
  Filled 2020-08-20: qty 1

## 2020-08-20 MED ORDER — SUCCINYLCHOLINE CHLORIDE 200 MG/10ML IV SOSY
PREFILLED_SYRINGE | INTRAVENOUS | Status: AC
  Filled 2020-08-20: qty 10

## 2020-08-20 MED ORDER — PANTOPRAZOLE SODIUM 40 MG IV SOLR: 40.0000 mg | Freq: Every day | INTRAVENOUS | Status: DC

## 2020-08-20 MED ORDER — PENTOSAN POLYSULFATE SODIUM 100 MG PO CAPS
100.0000 mg | ORAL_CAPSULE | Freq: Two times a day (BID) | ORAL | Status: DC
  Administered 2020-08-20 – 2020-08-21 (×3): 100 mg via ORAL
  Filled 2020-08-20 (×4): qty 1

## 2020-08-20 MED ORDER — ONDANSETRON HCL 4 MG/2ML IJ SOLN
INTRAMUSCULAR | Status: DC | PRN
  Administered 2020-08-20: 4 mg via INTRAVENOUS

## 2020-08-20 MED ORDER — ONDANSETRON HCL 4 MG/2ML IJ SOLN
INTRAMUSCULAR | Status: AC
  Filled 2020-08-20: qty 2

## 2020-08-20 MED ORDER — MIDAZOLAM HCL 5 MG/5ML IJ SOLN
INTRAMUSCULAR | Status: DC | PRN
  Administered 2020-08-20: 2 mg via INTRAVENOUS

## 2020-08-20 MED ORDER — ADULT MULTIVITAMIN W/MINERALS CH
1.0000 | ORAL_TABLET | Freq: Every day | ORAL | Status: DC
  Administered 2020-08-20 – 2020-08-21 (×2): 1 via ORAL
  Filled 2020-08-20 (×2): qty 1

## 2020-08-20 MED ORDER — SODIUM CHLORIDE 0.9% FLUSH
3.0000 mL | Freq: Two times a day (BID) | INTRAVENOUS | Status: DC
  Administered 2020-08-20: 3 mL via INTRAVENOUS

## 2020-08-20 MED ORDER — POTASSIUM CHLORIDE CRYS ER 20 MEQ PO TBCR
20.0000 meq | EXTENDED_RELEASE_TABLET | Freq: Two times a day (BID) | ORAL | Status: DC
  Administered 2020-08-20 – 2020-08-21 (×3): 20 meq via ORAL
  Filled 2020-08-20 (×3): qty 1

## 2020-08-20 MED ORDER — HYDROCODONE-ACETAMINOPHEN 10-325 MG PO TABS
1.0000 | ORAL_TABLET | ORAL | Status: DC | PRN
  Administered 2020-08-21: 1 via ORAL
  Filled 2020-08-20: qty 1

## 2020-08-20 MED ORDER — MAGNESIUM OXIDE 400 (241.3 MG) MG PO TABS
400.0000 mg | ORAL_TABLET | Freq: Every day | ORAL | Status: DC
  Administered 2020-08-20: 400 mg via ORAL
  Filled 2020-08-20: qty 1

## 2020-08-20 MED ORDER — FENTANYL CITRATE (PF) 100 MCG/2ML IJ SOLN
INTRAMUSCULAR | Status: DC | PRN
  Administered 2020-08-20: 100 ug via INTRAVENOUS

## 2020-08-20 MED ORDER — OXYCODONE HCL 5 MG/5ML PO SOLN
5.0000 mg | Freq: Once | ORAL | Status: AC | PRN
Start: 2020-08-20 — End: 2020-08-20

## 2020-08-20 MED ORDER — ONDANSETRON 4 MG PO TBDP: 4.0000 mg | ORAL_TABLET | Freq: Four times a day (QID) | ORAL | Status: DC | PRN

## 2020-08-20 MED ORDER — HEMOSTATIC AGENTS (NO CHARGE) OPTIME
TOPICAL | Status: DC | PRN
  Administered 2020-08-20: 1 via TOPICAL

## 2020-08-20 MED ORDER — BISOPROLOL-HYDROCHLOROTHIAZIDE 5-6.25 MG PO TABS
1.0000 | ORAL_TABLET | Freq: Every day | ORAL | Status: DC
  Administered 2020-08-20 – 2020-08-21 (×2): 1 via ORAL
  Filled 2020-08-20 (×2): qty 1

## 2020-08-20 MED ORDER — MORPHINE SULFATE (PF) 2 MG/ML IV SOLN
1.0000 mg | INTRAVENOUS | Status: DC | PRN
  Administered 2020-08-20: 1 mg via INTRAVENOUS
  Filled 2020-08-20: qty 1

## 2020-08-20 MED ORDER — INSULIN ASPART 100 UNIT/ML ~~LOC~~ SOLN: 4.0000 [IU] | Freq: Three times a day (TID) | SUBCUTANEOUS | Status: DC

## 2020-08-20 MED ORDER — HYDROCODONE-ACETAMINOPHEN 7.5-325 MG PO TABS
2.0000 | ORAL_TABLET | ORAL | Status: DC | PRN
  Administered 2020-08-20: 2 via ORAL
  Administered 2020-08-20: 1 via ORAL
  Administered 2020-08-21 (×2): 2 via ORAL
  Filled 2020-08-20 (×4): qty 2

## 2020-08-20 MED ORDER — ASCORBIC ACID 500 MG PO TABS
1000.0000 mg | ORAL_TABLET | Freq: Every evening | ORAL | Status: DC
  Administered 2020-08-20: 1000 mg via ORAL
  Filled 2020-08-20 (×2): qty 2

## 2020-08-20 MED ORDER — AZELASTINE HCL 0.1 % NA SOLN
2.0000 | Freq: Two times a day (BID) | NASAL | Status: DC
  Administered 2020-08-20 – 2020-08-21 (×2): 2 via NASAL
  Filled 2020-08-20: qty 30

## 2020-08-20 MED ORDER — ACETAMINOPHEN 650 MG RE SUPP
650.0000 mg | RECTAL | Status: DC | PRN
Start: 2020-08-20 — End: 2020-08-21

## 2020-08-20 MED ORDER — FLUTICASONE-UMECLIDIN-VILANT 100-62.5-25 MCG/INH IN AEPB: 1.0000 | INHALATION_SPRAY | Freq: Every day | RESPIRATORY_TRACT | Status: DC

## 2020-08-20 MED ORDER — PRAVASTATIN SODIUM 40 MG PO TABS
40.0000 mg | ORAL_TABLET | Freq: Every day | ORAL | Status: DC
  Administered 2020-08-20: 40 mg via ORAL
  Filled 2020-08-20: qty 1

## 2020-08-20 MED ORDER — PROMETHAZINE HCL 25 MG/ML IJ SOLN
6.2500 mg | INTRAMUSCULAR | Status: DC | PRN
Start: 2020-08-20 — End: 2020-08-20

## 2020-08-20 MED ORDER — DEXAMETHASONE 4 MG PO TABS
4.0000 mg | ORAL_TABLET | Freq: Four times a day (QID) | ORAL | Status: DC
  Administered 2020-08-20 – 2020-08-21 (×4): 4 mg via ORAL
  Filled 2020-08-20 (×4): qty 1

## 2020-08-20 MED ORDER — MIDAZOLAM HCL 2 MG/2ML IJ SOLN
INTRAMUSCULAR | Status: AC
  Filled 2020-08-20: qty 2

## 2020-08-20 MED ORDER — LIDOCAINE 2% (20 MG/ML) 5 ML SYRINGE
INTRAMUSCULAR | Status: AC
  Filled 2020-08-20: qty 5

## 2020-08-20 MED ORDER — ALBUTEROL SULFATE HFA 108 (90 BASE) MCG/ACT IN AERS
1.0000 | INHALATION_SPRAY | RESPIRATORY_TRACT | Status: DC | PRN
  Filled 2020-08-20: qty 6.7

## 2020-08-20 MED ORDER — MENTHOL 3 MG MT LOZG: 1.0000 | LOZENGE | OROMUCOSAL | Status: DC | PRN

## 2020-08-20 MED ORDER — SODIUM CHLORIDE 0.9% FLUSH: 3.0000 mL | INTRAVENOUS | Status: DC | PRN

## 2020-08-20 MED ORDER — OXYCODONE HCL 5 MG PO TABS
5.0000 mg | ORAL_TABLET | Freq: Once | ORAL | Status: AC | PRN
  Administered 2020-08-20: 5 mg via ORAL

## 2020-08-20 MED ORDER — TURMERIC 1053 MG PO TABS: 1160.0000 mg | ORAL_TABLET | Freq: Three times a day (TID) | ORAL | Status: DC

## 2020-08-20 MED ORDER — DEXAMETHASONE SODIUM PHOSPHATE 10 MG/ML IJ SOLN
INTRAMUSCULAR | Status: DC | PRN
  Administered 2020-08-20: 5 mg via INTRAVENOUS

## 2020-08-20 MED ORDER — METHOCARBAMOL 1000 MG/10ML IJ SOLN
500.0000 mg | Freq: Four times a day (QID) | INTRAVENOUS | Status: DC | PRN
  Filled 2020-08-20: qty 5

## 2020-08-20 MED ORDER — THROMBIN 20000 UNITS EX SOLR
CUTANEOUS | Status: AC
  Filled 2020-08-20: qty 20000

## 2020-08-20 MED ORDER — ONDANSETRON HCL 4 MG/2ML IJ SOLN: 4.0000 mg | Freq: Four times a day (QID) | INTRAMUSCULAR | Status: DC | PRN

## 2020-08-20 MED ORDER — CEFAZOLIN SODIUM-DEXTROSE 2-4 GM/100ML-% IV SOLN
2.0000 g | Freq: Three times a day (TID) | INTRAVENOUS | Status: AC
  Administered 2020-08-20 (×2): 2 g via INTRAVENOUS
  Filled 2020-08-20 (×2): qty 100

## 2020-08-20 MED ORDER — KRILL OIL 500 MG PO CAPS: 500.0000 mg | ORAL_CAPSULE | Freq: Every day | ORAL | Status: DC

## 2020-08-20 MED ORDER — BUPIVACAINE LIPOSOME 1.3 % IJ SUSP
INTRAMUSCULAR | Status: AC
  Filled 2020-08-20: qty 20

## 2020-08-20 MED ORDER — EPHEDRINE SULFATE-NACL 50-0.9 MG/10ML-% IV SOSY
PREFILLED_SYRINGE | INTRAVENOUS | Status: DC | PRN
  Administered 2020-08-20 (×2): 25 mg via INTRAVENOUS
  Administered 2020-08-20: 15 mg via INTRAVENOUS
  Administered 2020-08-20: 10 mg via INTRAVENOUS

## 2020-08-20 MED ORDER — CETAPHIL MOISTURIZING EX LOTN
TOPICAL_LOTION | Freq: Two times a day (BID) | CUTANEOUS | Status: DC
  Filled 2020-08-20: qty 473

## 2020-08-20 MED ORDER — FENTANYL CITRATE (PF) 100 MCG/2ML IJ SOLN
INTRAMUSCULAR | Status: AC
  Filled 2020-08-20: qty 2

## 2020-08-20 MED ORDER — FENTANYL CITRATE (PF) 100 MCG/2ML IJ SOLN
25.0000 ug | INTRAMUSCULAR | Status: DC | PRN
Start: 2020-08-20 — End: 2020-08-20
  Administered 2020-08-20 (×2): 25 ug via INTRAVENOUS

## 2020-08-20 MED ORDER — LACTATED RINGERS IV SOLN: INTRAVENOUS | Status: DC

## 2020-08-20 MED ORDER — FENTANYL CITRATE (PF) 250 MCG/5ML IJ SOLN
INTRAMUSCULAR | Status: AC
  Filled 2020-08-20: qty 5

## 2020-08-20 MED ORDER — FLUTICASONE FUROATE-VILANTEROL 100-25 MCG/INH IN AEPB
1.0000 | INHALATION_SPRAY | Freq: Every day | RESPIRATORY_TRACT | Status: DC
  Administered 2020-08-21: 1 via RESPIRATORY_TRACT
  Filled 2020-08-20: qty 28

## 2020-08-20 MED ORDER — DOCUSATE SODIUM 100 MG PO CAPS
100.0000 mg | ORAL_CAPSULE | Freq: Two times a day (BID) | ORAL | Status: DC
  Administered 2020-08-20 – 2020-08-21 (×3): 100 mg via ORAL
  Filled 2020-08-20 (×3): qty 1

## 2020-08-20 MED ORDER — ACETAMINOPHEN 325 MG PO TABS: 650.0000 mg | ORAL_TABLET | ORAL | Status: DC | PRN

## 2020-08-20 MED ORDER — MONTELUKAST SODIUM 10 MG PO TABS
10.0000 mg | ORAL_TABLET | Freq: Every day | ORAL | Status: DC
  Administered 2020-08-20: 10 mg via ORAL
  Filled 2020-08-20: qty 1

## 2020-08-20 MED ORDER — ROCURONIUM BROMIDE 10 MG/ML (PF) SYRINGE
PREFILLED_SYRINGE | INTRAVENOUS | Status: AC
  Filled 2020-08-20: qty 10

## 2020-08-20 MED ORDER — CYCLOBENZAPRINE HCL 10 MG PO TABS
10.0000 mg | ORAL_TABLET | Freq: Three times a day (TID) | ORAL | Status: DC
  Administered 2020-08-20 – 2020-08-21 (×3): 10 mg via ORAL
  Filled 2020-08-20 (×3): qty 1

## 2020-08-20 MED ORDER — LORATADINE 10 MG PO TABS
10.0000 mg | ORAL_TABLET | Freq: Every day | ORAL | Status: DC
  Administered 2020-08-21: 10 mg via ORAL
  Filled 2020-08-20: qty 1

## 2020-08-20 MED ORDER — CALCIUM CITRATE 950 (200 CA) MG PO TABS
1.0000 | ORAL_TABLET | Freq: Two times a day (BID) | ORAL | Status: DC
  Administered 2020-08-20 – 2020-08-21 (×3): 200 mg via ORAL
  Filled 2020-08-20 (×4): qty 1

## 2020-08-20 MED ORDER — HYDROCODONE-ACETAMINOPHEN 7.5-325 MG PO TABS
1.0000 | ORAL_TABLET | Freq: Four times a day (QID) | ORAL | Status: DC
Start: 2020-08-20 — End: 2020-08-21
  Administered 2020-08-20 (×2): 1 via ORAL
  Filled 2020-08-20: qty 1

## 2020-08-20 SURGICAL SUPPLY — 58 items
ADH SKN CLS APL DERMABOND .7 (GAUZE/BANDAGES/DRESSINGS)
BIT DRILL NEURO 2X3.1 SFT TUCH (MISCELLANEOUS) ×1 IMPLANT
BLADE CLIPPER SURG (BLADE) IMPLANT
BUR RND FLUTED 2.5 (BURR) IMPLANT
COLLAR CERV LO CONTOUR FIRM DE (SOFTGOODS) ×2 IMPLANT
COVER SURGICAL LIGHT HANDLE (MISCELLANEOUS) ×2 IMPLANT
COVER WAND RF STERILE (DRAPES) ×1 IMPLANT
DERMABOND ADVANCED (GAUZE/BANDAGES/DRESSINGS)
DERMABOND ADVANCED .7 DNX12 (GAUZE/BANDAGES/DRESSINGS) ×1 IMPLANT
DRAPE C-ARM 42X72 X-RAY (DRAPES) ×2 IMPLANT
DRAPE HALF SHEET 40X57 (DRAPES) ×4 IMPLANT
DRAPE LAPAROTOMY 100X72 PEDS (DRAPES) ×2 IMPLANT
DRAPE MICROSCOPE LEICA (MISCELLANEOUS) ×1 IMPLANT
DRAPE SURG 17X23 STRL (DRAPES) ×8 IMPLANT
DRILL NEURO 2X3.1 SOFT TOUCH (MISCELLANEOUS) ×2
DRSG MEPILEX BORDER 4X4 (GAUZE/BANDAGES/DRESSINGS) IMPLANT
DRSG MEPILEX BORDER 4X8 (GAUZE/BANDAGES/DRESSINGS) IMPLANT
DRSG XEROFORM 1X8 (GAUZE/BANDAGES/DRESSINGS) ×1 IMPLANT
DURAPREP 6ML APPLICATOR 50/CS (WOUND CARE) ×2 IMPLANT
ELECT BLADE 6.5 EXT (BLADE) ×1 IMPLANT
ELECT CAUTERY BLADE 6.4 (BLADE) ×2 IMPLANT
ELECT REM PT RETURN 9FT ADLT (ELECTROSURGICAL) ×2
ELECTRODE REM PT RTRN 9FT ADLT (ELECTROSURGICAL) ×1 IMPLANT
EVACUATOR 1/8 PVC DRAIN (DRAIN) IMPLANT
GAUZE SPONGE 4X4 12PLY STRL (GAUZE/BANDAGES/DRESSINGS) ×2 IMPLANT
GLOVE ECLIPSE 9.0 STRL (GLOVE) ×2 IMPLANT
GLOVE ORTHO TXT STRL SZ7.5 (GLOVE) ×2 IMPLANT
GLOVE SRG 8 PF TXTR STRL LF DI (GLOVE) ×1 IMPLANT
GLOVE SURG 8.5 LATEX PF (GLOVE) ×2 IMPLANT
GLOVE SURG UNDER POLY LF SZ8 (GLOVE) ×2
GOWN STRL REUS W/ TWL LRG LVL3 (GOWN DISPOSABLE) ×1 IMPLANT
GOWN STRL REUS W/TWL 2XL LVL3 (GOWN DISPOSABLE) ×4 IMPLANT
GOWN STRL REUS W/TWL LRG LVL3 (GOWN DISPOSABLE) ×2
KIT BASIN OR (CUSTOM PROCEDURE TRAY) ×2 IMPLANT
KIT TURNOVER KIT B (KITS) ×2 IMPLANT
MANIFOLD NEPTUNE II (INSTRUMENTS) ×1 IMPLANT
NDL SPNL 18GX3.5 QUINCKE PK (NEEDLE) ×1 IMPLANT
NEEDLE SPNL 18GX3.5 QUINCKE PK (NEEDLE) ×2 IMPLANT
NS IRRIG 1000ML POUR BTL (IV SOLUTION) ×2 IMPLANT
PACK ORTHO CERVICAL (CUSTOM PROCEDURE TRAY) ×2 IMPLANT
PAD ARMBOARD 7.5X6 YLW CONV (MISCELLANEOUS) ×4 IMPLANT
PATTIES SURGICAL .25X.25 (GAUZE/BANDAGES/DRESSINGS) ×1 IMPLANT
PATTIES SURGICAL .75X.75 (GAUZE/BANDAGES/DRESSINGS) ×1 IMPLANT
SPONGE LAP 4X18 RFD (DISPOSABLE) ×1 IMPLANT
SPONGE SURGIFOAM ABS GEL 100 (HEMOSTASIS) IMPLANT
SUT ETHIBOND CT1 BRD #0 30IN (SUTURE) IMPLANT
SUT VIC AB 0 CT1 27 (SUTURE)
SUT VIC AB 0 CT1 27XBRD ANBCTR (SUTURE) IMPLANT
SUT VIC AB 1 CT1 36 (SUTURE) ×2 IMPLANT
SUT VIC AB 2-0 CT1 27 (SUTURE)
SUT VIC AB 2-0 CT1 TAPERPNT 27 (SUTURE) IMPLANT
SUT VIC AB 2-0 UR6 27 (SUTURE) IMPLANT
SUT VIC AB 3-0 X1 27 (SUTURE) ×2 IMPLANT
SUT VICRYL 0 UR6 27IN ABS (SUTURE) ×3 IMPLANT
TOWEL GREEN STERILE (TOWEL DISPOSABLE) ×2 IMPLANT
TOWEL GREEN STERILE FF (TOWEL DISPOSABLE) ×2 IMPLANT
TRAY FOLEY MTR SLVR 16FR STAT (SET/KITS/TRAYS/PACK) ×1 IMPLANT
WATER STERILE IRR 1000ML POUR (IV SOLUTION) ×1 IMPLANT

## 2020-08-20 NOTE — Anesthesia Postprocedure Evaluation (Signed)
Anesthesia Post Note  Patient: KEYSHUN ELPERS  Procedure(s) Performed: LEFT CERVICAL SIX THROUGH SEVEN  AND CERVICAL SEVEN THROUGH THORACIC ONE  FORAMINOTOMIES WITH EXCISION OF DISC HERNIATION LEFT CERVICAL SEVEN THROUGH THORACIC ONE (N/A Neck)     Patient location during evaluation: PACU Anesthesia Type: General Level of consciousness: awake and alert and oriented Pain management: pain level controlled Vital Signs Assessment: post-procedure vital signs reviewed and stable Respiratory status: spontaneous breathing, nonlabored ventilation and respiratory function stable Cardiovascular status: blood pressure returned to baseline and tachycardic Postop Assessment: no apparent nausea or vomiting Anesthetic complications: no   No complications documented.  Last Vitals:  Vitals:   08/20/20 1245 08/20/20 1311  BP: (!) 173/94 (!) 154/76  Pulse: (!) 106 (!) 109  Resp: 20 20  Temp: 37.1 C 37.2 C  SpO2: 92% 95%    Last Pain:  Vitals:   08/20/20 1311  TempSrc: Oral  PainSc:                  Brennan Bailey

## 2020-08-20 NOTE — Discharge Instructions (Addendum)
No lifting greater than 10 lbs. °Avoid bending, stooping and twisting. °Walking in house for first week then may start to get out slowly increasing activity using arms. °Keep incision dry for 3 days, may use tegaderm or similar water impervious dressing. °Avoid overhead use of arms and overhead lifting. °Wear collar for comfort. °Use ice as needed for comfort. °

## 2020-08-20 NOTE — Brief Op Note (Signed)
08/20/2020  10:57 AM  PATIENT:  Mark Harrell  60 y.o. male  PRE-OPERATIVE DIAGNOSIS:  left C7-T1 Disc herniation, left C6-7 foraminal stenosis  POST-OPERATIVE DIAGNOSIS:  left C7-T1 Disc herniation, left C6-7 foraminal stenosis  PROCEDURE:  Procedure(s): LEFT CERVICAL SIX THROUGH SEVEN  AND CERVICAL SEVEN THROUGH THORACIC ONE  FORAMINOTOMIES WITH EXCISION OF DISC HERNIATION LEFT CERVICAL SEVEN THROUGH THORACIC ONE (N/A)  SURGEON:  Surgeon(s) and Role:    * Jessy Oto, MD - Primary  PHYSICIAN ASSISTANT: Benjiman Core, PA-C  ANESTHESIA:   local and general , Dr. Daiva Huge  EBL:  200 mL   BLOOD ADMINISTERED:none  DRAINS: none and Urinary Catheter (Foley)   LOCAL MEDICATIONS USED:  MARCAINE 0.5% 1:1 EXPAREL 1.3%   Amount: 15 ml  SPECIMEN:  No Specimen  DISPOSITION OF SPECIMEN:  N/A  COUNTS:  YES  TOURNIQUET:  * No tourniquets in log *  DICTATION: .Dragon Dictation  PLAN OF CARE: Admit for overnight observation  PATIENT DISPOSITION:  PACU - hemodynamically stable.   Delay start of Pharmacological VTE agent (>24hrs) due to surgical blood loss or risk of bleeding: yes

## 2020-08-20 NOTE — Progress Notes (Signed)
Pt has refused cpap at this time stating he no longer wears one d/t it causing him night terrors when he originally started with it. Pt states he will be fine on [place] at this time.  RT will continue to monitor.

## 2020-08-20 NOTE — Progress Notes (Signed)
Patient refused to be stuck for CBG checks at bedtime.

## 2020-08-20 NOTE — Op Note (Signed)
08/20/2020  11:00 AM  PATIENT:  Mark Harrell  60 y.o. male  MRN: [id number]  OPERATIVE REPORT  PRE-OPERATIVE DIAGNOSIS:  left C7-T1 Disc herniation, left C6-7 foraminal stenosis  POST-OPERATIVE DIAGNOSIS:  left C7-T1 Disc herniation, left C6-7 foraminal stenosis  PROCEDURE:  Procedure(s): LEFT CERVICAL SIX THROUGH SEVEN  AND CERVICAL SEVEN THROUGH THORACIC ONE  FORAMINOTOMIES WITH EXCISION OF DISC HERNIATION LEFT CERVICAL SEVEN THROUGH THORACIC ONE    SURGEON:  Jessy Oto, MD     ASSISTANT:  Benjiman Core, PA-C  (Present throughout the entire procedure and necessary for completion of procedure in a timely manner)     ANESTHESIA:  General,    COMPLICATIONS:  None.     COMPONENTS:   PROCEDURE:The patient was met in the holding area, and the appropriate left C7-T1 and left C6-7 cervical level identified and marked with "x" and my initials. All questions were answered and informed consent signed. The patient was then transported to OR. The patient was then placed under general anesthesia without difficulty. A foley catheter was placed sterilely by OR nursing personnel. and transferred to the operating room table prone position [place] horseshoe with Oralia Manis. All pressure points well-padded PAS stockings.Shoulders taped down and skin over the posterior inferior aspect of the neck place in traction to decrease skin folds. The patient received appropriate preoperative antibiotic 3 grams ancef. prophylaxis.Time-out procedure was called and correct.  Sterile prep with DuraPrep and draped in the usual manner the shoulders were taped downwards and skin traction over the skin of the neck. Following DuraPrep draped in the usual manner. After timeout protocol. Spinal needle placed left side  Incision at the expected C7-T1 and C6-7 levels,The skin incision was made approximately C6-7 about 3 inches in length about 3/4 inch below the lower spinal needle and at the upper spinal needle one inch in  length in the midline. This following infiltration of skin and subcutaneous layers with marcaine 0.5% with 1;1 exparel  total of 20 cc. Incision carried through skin and subcutaneous layers using 10 blade scalpel and electrocautery down to the level ligamentum nuchae. Incision made along the left lateral spinous process of C7 extending upwards to the C6 spinous process and at the superior incision site exposing the spinous process of C5. Clamp then placed at the spinous process of C6 and  intraoperative C-arm fluoroscopy identified the clamp at the C5-6 spinous process level and the spinous processes further indentified with large bifid spinous porcess at C5 and less bifid at C6 and the Spinous process of C7 is single and not bifid.. A marking pen was used to mark the spinous process of C6 and C7. Electrocautery then used to carefully incise the cervical muscles off the left lateral aspect of the spinous process ofT1 C7 and C6. Dividing the spinous muscles off of the inferior aslpect lamina at C6 exposing the C6-7 posterior aspect of the interlaminar space. The magnification headlamp were used during this portion procedure.The operating room microscope sterilely draped and brought int the field Boss McCollough retractor was inserted initially then the versasystem brogh High-speed bur was used to remove a small portion of bone from the inferior aspect of lamina of C6 and the medial 20% of the inferior articular process of C6. Further thinning the superior aspect of the lamina left C7. A 1 mm Kerrison was then used to remove him from superior aspect of the lamina C7 and the medial aspect of the infra-articular process of C6 the 20%, exposing the  superior articular process of C7. A 1 mm Kerrison was removed bone off the medial aspect of the superior articular process of C7 resecting 20% of the medial aspect of the superior articular process of C7. Ligamentum flavum then easily lifted superiorly electrocautery unit  cauterizing epidural veins deep to the ligamentum flavum then resecting the ligamentum flavum.  Under the operating room microscope the epidural vein layer overlying the posterior aspect of the thecal sac and the C7 nerve root was then carefully cauterized using bipolar electrocautery  Bone wax was applied to bleeding cancellus bone surfaces are excellent hemostasis obtained The disc explored using a Penfield 4 found to be protruded. No significant disc material found to be herniated and uncovertebral joint found to be prominent resulting in left C7 foramenal narrowing. Following this then hemostasis was obtained using thrombin-soaked Gelfoam and micro-pledgettes. When complete hemostasis was obtained all gel foam was removed I nerve hook could be easily passed out the neuroforamen without the lateral aspect of the C7 pedicle demonstrate the C7 neuroforamen completely decompressed. Irrigation was carried out no active bleeding was present. Electrocautery then used to carefully incise the cervical muscles off the left lateral aspect of the spinous process of C7 and T1, Dividing the spinous muscles off of the inferior aspect lamina at C7 exposing the C7-T1 posterior aspect of the interlaminar space.  High-speed bur was used to remove a small portion of bone from the inferior aspect of lamina of C7 and the medial 20% of the inferior articular process of C7. Further thinning the superior aspect of the lamina left T1. A 1 mm Kerrison was then used to remove him from superior aspect of the lamina T1 and the medial aspect of the infra-articular process of C7 the 20%, exposing the superior articular process of T1. A 1 mm Kerrison was removed bone off the medial aspect of the superior articular process of T1 resecting 20% of the medial aspect of the superior articular process of T1. Ligamentum flavum then easily lifted superiorly electrocautery unit cauterizing epidural veins deep to the ligamentum flavum then resecting the  ligamentum flavum. The operating room microscope was draped sterilely and brought into the field. Under the operating room microscope the epidural vein layer overlying the posterior aspect of the thecal sac and the C8 nerve root was then carefully lifted using a micro-titanium then cauterized using bipolar electrocautery a # 15 blade scalpel then used to incise this overlying the C8 nerve root releasing the vascular leash a backward angle 3-0 microcurette then used to remove a small portion of bone off the superior and medial aspect of the pedicle further mobilizing the C8 nerve root bipolar electrocautery to control all bleeding within the axillary area of the C8 nerve. Bone wax was applied to bleeding cancellus bone surfaces are excellent hemostasis obtained The disc explored using a Penfield 4 found to be protruded. Due to large amount of epidural bleeding the disc was not removed and foraminotomy was completed.  The uncovertebral joint found to be prominent resulting in left C8 foramenal narrowing. Following this then hemostasis was obtained using thrombin-soaked Gelfoam and micro-pledgettes. When complete hemostasis was obtained all gel foam was removed I nerve hook could be easily passed out the neuroforamen without the lateral aspect of the C8 pedicle demonstrate the C8neuroforamen completely decompressed. Irrigation was carried out no active bleeding was present. The incision was closed by approximating the ligamentum nuchae with #1 Vicryl sutures. The subcutaneous layers approximated with interrupted 0 Vicryl suture more superficial  layers with interrupted 2-0 Vicryl sutures and the skin closed with interrupted 4-0 Vicryl sutures. Dermabond was applied to both incision site.then MedPlex bandage.  The incision was closed by approximating the ligamentum nuchae with 0 Vicryl sutures. The subcutaneous layers approximated with interrupted 0 Vicryl suture more superficial layers with interrupted 2-0 Vicryl  sutures and the skin closed with interrupted 4-0 Vicryl sutures.  Dermabond was applied then a long MedPlex bandage bridging both incision sites. Soft cervical collar placed.  All instrument and sponge counts were correct. Patient was then returned to supine position on her stretcher. Returned to recovery room in satisfactory condition.  Physician assistant's responsibilities: Benjiman Core PA-C perform the duties of assistant physician and surgeon during this case present from the beginning of the case to the end of the case. He assisted with careful retraction of neural structures suctioning about her elements including cervical cord and C7 and C8 nerve roots. Performed closure of the incision on the ligamentum nuchae to the skin and application of dressing. He assisted in positioning the patient had removal the patient from the OR table to her stretcher.     [name]  08/20/2020, 11:00 AM

## 2020-08-20 NOTE — Progress Notes (Signed)
Orthopedic Tech Progress Note Patient Details:  Mark Harrell 04-08-1961 [id number] Patient has on a collar, RN stated patient needed another collar for showers Ortho Devices Type of Ortho Device: Soft collar Ortho Device/Splint Interventions: Other (comment)   Post Interventions Patient Tolerated: Other (comment) Instructions Provided: Other (comment)   [name] 08/20/2020, 2:03 PM

## 2020-08-20 NOTE — Interval H&P Note (Signed)
History and Physical Interval Note:  08/20/2020 7:22 AM  Mark Harrell  has presented today for surgery, with the diagnosis of left C7-T1 Disc herniation, left C6-7 foraminal stenosis.  The various methods of treatment have been discussed with the patient and family. After consideration of risks, benefits and other options for treatment, the patient has consented to  Procedure(s): LEFT C6-7 AND C7-T1 FORAMINOTOMIES WITH EXCISION OF DISC HERNIATION LEFT C7-T1 (N/A) as a surgical intervention.  The patient's history has been reviewed, patient examined, no change in status, stable for surgery.  I have reviewed the patient's chart and labs.  Questions were answered to the patient's satisfaction.     [name]

## 2020-08-20 NOTE — Transfer of Care (Signed)
Immediate Anesthesia Transfer of Care Note  Patient: Mark Harrell  Procedure(s) Performed: LEFT CERVICAL SIX THROUGH SEVEN  AND CERVICAL SEVEN THROUGH THORACIC ONE  FORAMINOTOMIES WITH EXCISION OF DISC HERNIATION LEFT CERVICAL SEVEN THROUGH THORACIC ONE (N/A Neck)  Patient Location: PACU  Anesthesia Type:General  Level of Consciousness: awake  Airway & Oxygen Therapy: Patient Spontanous Breathing and Patient connected to face mask oxygen  Post-op Assessment: Report given to RN and Post -op Vital signs reviewed and stable  Post vital signs: Reviewed and stable  Last Vitals:  Vitals Value Taken Time  BP 150/79 08/20/20 1121  Temp    Pulse 101 08/20/20 1126  Resp 21 08/20/20 1126  SpO2 98 % 08/20/20 1126  Vitals shown include unvalidated device data.  Last Pain:  Vitals:   08/20/20 0626  TempSrc: Oral  PainSc:       Patients Stated Pain Goal: 4 (38/38/18 4037)  Complications: No complications documented.

## 2020-08-20 NOTE — Anesthesia Procedure Notes (Signed)
Procedure Name: Intubation Date/Time: 08/20/2020 7:44 AM Performed by: Hoy Morn, CRNA Pre-anesthesia Checklist: Patient identified, Emergency Drugs available, Suction available and Patient being monitored Patient Re-evaluated:Patient Re-evaluated prior to induction Oxygen Delivery Method: Circle system utilized Preoxygenation: Pre-oxygenation with 100% oxygen Induction Type: IV induction Ventilation: Mask ventilation without difficulty Laryngoscope Size: Glidescope and 4 Grade View: Grade I Tube type: Oral Tube size: 7.5 mm Number of attempts: 1 Airway Equipment and Method: Stylet and Oral airway Placement Confirmation: ETT inserted through vocal cords under direct vision,  positive ETCO2 and breath sounds checked- equal and bilateral Secured at: 24 cm Tube secured with: Tape Dental Injury: Teeth and Oropharynx as per pre-operative assessment

## 2020-08-21 DIAGNOSIS — Z9889 Other specified postprocedural states: Secondary | ICD-10-CM | POA: Diagnosis not present

## 2020-08-21 DIAGNOSIS — M4803 Spinal stenosis, cervicothoracic region: Secondary | ICD-10-CM | POA: Diagnosis not present

## 2020-08-21 DIAGNOSIS — M50223 Other cervical disc displacement at C6-C7 level: Secondary | ICD-10-CM | POA: Diagnosis not present

## 2020-08-21 DIAGNOSIS — M5124 Other intervertebral disc displacement, thoracic region: Secondary | ICD-10-CM | POA: Diagnosis not present

## 2020-08-21 DIAGNOSIS — Z881 Allergy status to other antibiotic agents status: Secondary | ICD-10-CM | POA: Diagnosis not present

## 2020-08-21 DIAGNOSIS — M4802 Spinal stenosis, cervical region: Secondary | ICD-10-CM | POA: Diagnosis not present

## 2020-08-21 DIAGNOSIS — Z87891 Personal history of nicotine dependence: Secondary | ICD-10-CM | POA: Diagnosis not present

## 2020-08-21 LAB — BASIC METABOLIC PANEL
Anion gap: 8 (ref 5–15)
BUN: 12 mg/dL (ref 6–20)
CO2: 29 mmol/L (ref 22–32)
Calcium: 9.3 mg/dL (ref 8.9–10.3)
Chloride: 98 mmol/L (ref 98–111)
Creatinine, Ser: 0.83 mg/dL (ref 0.61–1.24)
GFR, Estimated: >60 mL/min (ref >60)
Glucose, Bld: 174 mg/dL — ABNORMAL HIGH (ref 70–99)
Potassium: 4.5 mmol/L (ref 3.5–5.1)
Sodium: 135 mmol/L (ref 135–145)

## 2020-08-21 LAB — CBC
HCT: 44.1 % (ref 39.0–52.0)
Hemoglobin: 14.9 g/dL (ref 13.0–17.0)
MCH: 30.3 pg (ref 26.0–34.0)
MCHC: 33.8 g/dL (ref 30.0–36.0)
MCV: 89.8 fL (ref 80.0–100.0)
Platelets: 251 10*3/uL (ref 150–400)
RBC: 4.91 MIL/uL (ref 4.22–5.81)
RDW: 13.6 % (ref 11.5–15.5)
WBC: 16.2 10*3/uL — ABNORMAL HIGH (ref 4.0–10.5)
nRBC: 0 % (ref 0.0–0.2)

## 2020-08-21 MED ORDER — CELECOXIB 200 MG PO CAPS: 200.0000 mg | ORAL_CAPSULE | Freq: Two times a day (BID) | ORAL | 2 refills | Status: DC

## 2020-08-21 MED ORDER — HYDROCODONE-ACETAMINOPHEN 10-325 MG PO TABS: 1.0000 | ORAL_TABLET | ORAL | 0 refills | Status: DC | PRN

## 2020-08-21 NOTE — Progress Notes (Signed)
Patient is discharged from room 3C09 at this time. Alert and in stable condition. IV site d/c'd and instructions read to patient and family with understanding verbalized and all questions answered. Left unit via wheelchair with all belongings at side.

## 2020-08-21 NOTE — Discharge Summary (Signed)
Physician Discharge Summary      Patient ID: Mark Harrell MRN: [id number] DOB/AGE: 1961-04-06 60 y.o.  Admit date: 08/20/2020 Discharge date:08/21/2020   Admission Diagnoses:  Active Problems:   Status post cervical discectomy   Discharge Diagnoses:  Same  Past Medical History:  Diagnosis Date  . Anesthesia complication requiring reversal agent administration    ? from central apnea, very difficult to get off vent  . Anxiety   . Arthritis    osteo  . Asthma   . BPH (benign prostatic hyperplasia)   . Complication of anesthesia    difficulty waking , they twlight me because of my respiratory problems "  . Depression   . Dyspnea    on exertion  . Enlarged heart   . Family history of adverse reaction to anesthesia    mother trouble waking up, and heart stopped  . GERD (gastroesophageal reflux disease)   . Headache    botox injections for headaches  . Hyperlipidemia   . Hypertension   . Hypogonadism male   . IBS (irritable bowel syndrome)   . Memory difficulties    short term memories  . Neuropathy   . Obesity   . On home oxygen therapy    on 2 liter  . OSA (obstructive sleep apnea)    not using cpap  . Paralysis (HCC)    left hand - small  . Pneumonia   . Pre-diabetes   . Prostatitis     Surgeries: Procedure(s): LEFT CERVICAL SIX THROUGH SEVEN  AND CERVICAL SEVEN THROUGH THORACIC ONE  FORAMINOTOMIES WITH EXCISION OF DISC HERNIATION LEFT CERVICAL SEVEN THROUGH THORACIC ONE on 08/20/2020   Consultants:   Discharged Condition: Improved  Hospital Course: Mark Harrell is an 60 y.o. male who was admitted 08/20/2020 with a chief complaint of No chief complaint on file. , and found to have a diagnosis of <principal problem not specified>.  He was brought to the operating room on 08/20/2020 and underwent the above named procedures.    He was given perioperative antibiotics:  Anti-infectives (From admission, onward)   Start     Dose/Rate Route Frequency Ordered Stop    08/20/20 2200  doxycycline (VIBRA-TABS) tablet 100 mg        100 mg Oral 2 times daily 08/20/20 1259     08/20/20 1400  ceFAZolin (ANCEF) IVPB 2g/100 mL premix        2 g 200 mL/hr over 30 Minutes Intravenous Every 8 hours 08/20/20 1300 08/20/20 2133   08/20/20 0600  ceFAZolin (ANCEF) 3 g in dextrose 5 % 50 mL IVPB        3 g 100 mL/hr over 30 Minutes Intravenous On call to O.R. 08/17/20 1023 08/20/20 1339    Recovered uneventfully in PACU and was transferred to med-surg floor 3 C. VSS. He is on home oxygen intermittantly and remained on 1.5 l oxygen pnc. POD#1 foley was removed and he was able to void without difficulty. Walking in the hallway. Complaints of right arm ulnar 2 digits with numbess and tingling. Likely due to position on OR table and some irritation of the ulnar nerve here. He has normal motor exam. Dressing is dry and was changed last evening. He was discharged home on POD#1  He was given sequential compression devices, early ambulation, and chemoprophylaxis for DVT prophylaxis.  He benefited maximally from their hospital stay and there were no complications.    Recent vital signs:  Vitals:   08/21/20 0750 08/21/20  0909  BP: (!) 152/88   Pulse: 92 94  Resp: 18 18  Temp: 97.9 F (36.6 C)   SpO2: 95% 94%    Recent laboratory studies:  Results for orders placed or performed during the hospital encounter of 08/20/20  Glucose, capillary  Result Value Ref Range   Glucose-Capillary 85 70 - 99 mg/dL   Comment 1 Notify RN   Glucose, capillary  Result Value Ref Range   Glucose-Capillary 166 (H) 70 - 99 mg/dL  CBC  Result Value Ref Range   WBC 16.2 (H) 4.0 - 10.5 K/uL   RBC 4.91 4.22 - 5.81 MIL/uL   Hemoglobin 14.9 13.0 - 17.0 g/dL   HCT 44.1 39.0 - 52.0 %   MCV 89.8 80.0 - 100.0 fL   MCH 30.3 26.0 - 34.0 pg   MCHC 33.8 30.0 - 36.0 g/dL   RDW 13.6 11.5 - 15.5 %   Platelets 251 150 - 400 K/uL   nRBC 0.0 0.0 - 0.2 %  Basic Metabolic Panel  Result Value Ref Range    Sodium 135 135 - 145 mmol/L   Potassium 4.5 3.5 - 5.1 mmol/L   Chloride 98 98 - 111 mmol/L   CO2 29 22 - 32 mmol/L   Glucose, Bld 174 (H) 70 - 99 mg/dL   BUN 12 6 - 20 mg/dL   Creatinine, Ser 0.83 0.61 - 1.24 mg/dL   Calcium 9.3 8.9 - 10.3 mg/dL   GFR, Estimated >60 >60 mL/min   Anion gap 8 5 - 15    Discharge Medications:      Reactions   Bee Venom Swelling   Duloxetine Shortness Of Breath   Brought on asthma   Ppd [tuberculin Purified Protein Derivative] Other (See Comments)   +ppd NEG Quantferron Gold 3/13 (shows false positive)    Fenofibrate Other (See Comments)   Back pain   Verapamil Other (See Comments)   Back pain   Claritin [loratadine] Other (See Comments)   UNKNOWN    Levofloxacin Diarrhea   Other Diarrhea   "Some antibiotics" cause diarrhea      Medication List    TAKE these medications   acetaminophen 500 MG tablet Commonly known as: TYLENOL Take 1,000 mg by mouth every 6 (six) hours as needed for moderate pain.   albuterol 108 (90 Base) MCG/ACT inhaler Commonly known as: VENTOLIN HFA 1 to 2 inhalations 10 to 15 minutes apart every 4 hrs to rescue Asthma What changed:   how much to take  how to take this  when to take this  reasons to take this  additional instructions   anastrozole 1 MG tablet Commonly known as: ARIMIDEX Take 1 mg by mouth daily.   ascorbic acid 1000 MG tablet Commonly known as: VITAMIN C Take 1,000 mg by mouth every evening.   azelastine 0.1 % nasal spray Commonly known as: ASTELIN Use 1 to 2 sprays  Each Nostril 2 x /day What changed:   how much to take  how to take this  when to take this  additional instructions   bisoprolol-hydrochlorothiazide 5-6.25 MG tablet Commonly known as: ZIAC TAKE 1 TABLET DAILY FOR BLOOD PRESSURE What changed:   how much to take  how to take this  when to take this  additional instructions   Botulinum Toxin Type A 200 units Solr Inject 200 Units into the skin every  3 (three) months.   celecoxib 200 MG capsule Commonly known as: CELEBREX Take 1 capsule (200 mg total)  by mouth every 12 (twelve) hours.   cetaphil lotion Apply topically 2 (two) times daily. What changed: how much to take   cetirizine 10 MG tablet Commonly known as: ZYRTEC Take 10 mg by mouth daily.   Cinnamon 500 MG capsule Take 1,000 mg by mouth 3 (three) times daily.   Citracal Maximum Plus Tabs Take 1 tablet by mouth 2 (two) times daily. Vitamin D   clotrimazole-betamethasone cream Commonly known as: LOTRISONE APPLY TO AFFECTED AREA TWICE A DAY What changed: See the new instructions.   CO Q 10 PO Take 600 mg by mouth daily.   cyclobenzaprine 10 MG tablet Commonly known as: FLEXERIL Take 10 mg by mouth 3 (three) times daily.   diclofenac Sodium 1 % Gel Commonly known as: VOLTAREN APPLY 2 TO 4 GRAMS TOPICALLY 2 TO 4 TIMES DAILY FOR PAIN & INFLAMMATION What changed:   how much to take  how to take this  when to take this  reasons to take this  additional instructions   donepezil 23 MG Tabs tablet Commonly known as: ARICEPT Take 23 mg by mouth daily.   doxycycline 100 MG capsule Commonly known as: VIBRAMYCIN TAKE 1 CAPSULE BY MOUTH TWICE A DAY   dupilumab 300 MG/2ML prefilled syringe Commonly known as: DUPIXENT Inject 300 mg into the skin once a week.   Eluxadoline 100 MG Tabs Take 100 mg by mouth 2 (two) times daily.   EPINEPHrine 0.3 mg/0.3 mL Soaj injection Commonly known as: EPI-PEN Inject 0.3 mg into the muscle as needed. What changed: reasons to take this   fluticasone 50 MCG/ACT nasal spray Commonly known as: Flonase Use 1 to 2 sprays each Nostril 2 x /day What changed:   how much to take  how to take this  when to take this  additional instructions   furosemide 80 MG tablet Commonly known as: LASIX TAKE 1 TO 1 & 1/2 TABLETS 2 X /DAY FOR FLUID RETENTION & SWELLING OF LEGS   Glucos-Chondroit-MSM Complex Tabs Take 2 tablets by  mouth 3 (three) times daily.   Guaifenesin 1200 MG Tb12 Take 1,200 mg by mouth 2 (two) times daily.   HYDROcodone-acetaminophen 10-325 MG tablet Commonly known as: NORCO Take 1 tablet by mouth every 4 (four) hours as needed for up to 7 days for moderate pain ((score 4 to 6)).   ipratropium-albuterol 0.5-2.5 (3) MG/3ML Soln Commonly known as: DUONEB Take 3 mLs by nebulization every 4 (four) hours as needed. What changed: reasons to take this   Krill Oil 500 MG Caps Take 500 mg by mouth at bedtime.   L-METHYLFOLATE-B6-B12 PO Take 1 tablet by mouth daily.   Magnesium 500 MG Caps Take 500 mg by mouth daily with supper.   memantine 10 MG tablet Commonly known as: NAMENDA Take 10 mg by mouth 2 (two) times daily.   metFORMIN 500 MG 24 hr tablet Commonly known as: GLUCOPHAGE-XR Takes 2 tablets 2 x /day with Breakfast & Supper  for Diabetes What changed:   how much to take  how to take this  when to take this  additional instructions   montelukast 10 MG tablet Commonly known as: SINGULAIR Take 1 tablet Daily for Allergies What changed:   how much to take  how to take this  when to take this  additional instructions   PreserVision AREDS 2 Caps Take 1 capsule by mouth 2 (two) times daily.   multivitamin with minerals tablet Take 1 tablet by mouth daily.   naloxone  4 MG/0.1ML Liqd nasal spray kit Commonly known as: NARCAN Place 1 spray into the nose as needed (opioid overdose).   ondansetron 4 MG disintegrating tablet Commonly known as: ZOFRAN-ODT TAKE 1 TABLET (4 MG TOTAL) BY MOUTH EVERY 6 (SIX) HOURS AS NEEDED FOR NAUSEA OR VOMITING.   oxymetazoline 0.05 % nasal spray Commonly known as: AFRIN Place 1 spray into both nostrils at bedtime.   pentosan polysulfate 100 MG capsule Commonly known as: ELMIRON Take 100 mg by mouth 2 (two) times daily.   potassium chloride SA 20 MEQ tablet Commonly known as: Klor-Con M20 TAKE 1 TABLET TWICE DAILY FOR  POTASSIUM What changed:   how much to take  how to take this  when to take this  additional instructions   pravastatin 40 MG tablet Commonly known as: PRAVACHOL Take 1 tablet at Bedtime for Cholesterol What changed:   how much to take  how to take this  when to take this  additional instructions   pregabalin 150 MG capsule Commonly known as: LYRICA TAKE 1 CAPSULE 3 TIMES A DAY What changed:   how much to take  how to take this  when to take this  additional instructions   PROBIOTIC PRODUCT PO Take 1 capsule by mouth 2 (two) times daily. VSL   promethazine-dextromethorphan 6.25-15 MG/5ML syrup Commonly known as: PROMETHAZINE-DM TAKE 5 MLS BY MOUTH 4 (FOUR) TIMES DAILY AS NEEDED FOR COUGH.   sertraline 50 MG tablet Commonly known as: ZOLOFT Take 50 mg by mouth 2 (two) times daily.   sodium chloride 0.65 % Soln nasal spray Commonly known as: OCEAN Place 1 spray into both nostrils 4 (four) times daily as needed for congestion. Uses each time before other nasal sprays   tadalafil 5 MG tablet Commonly known as: CIALIS Take 5 mg by mouth daily.   traMADol 50 MG tablet Commonly known as: ULTRAM Take 1 tablet (50 mg total) by mouth every 6 (six) hours as needed. What changed: reasons to take this   Trelegy Ellipta 100-62.5-25 MCG/INH Aepb Generic drug: Fluticasone-Umeclidin-Vilant Inhale 1 puff into the lungs daily. What changed: when to take this   trospium 20 MG tablet Commonly known as: SANCTURA Take 20 mg by mouth 2 (two) times daily. In the evening and at bedtime   TURMERIC PO Take 1,160 mg by mouth 3 (three) times daily.   Vitamin D3 125 MCG (5000 UT) Tabs Take 5 tablets (25,000 Units total) by mouth See admin instructions. Take 15000 in the morning and 10,000 in the evening   zinc gluconate 50 MG tablet Take 50 mg by mouth 3 (three) times daily.            Durable Medical Equipment  (From admission, onward)         Start      Ordered   08/20/20 1301  DME Walker rolling  Once       Question Answer Comment  Walker: With 5 Inch Wheels   Patient needs a walker to treat with the following condition Status post cervical discectomy      08/20/20 1300          Diagnostic Studies: DG Chest 2 View  Result Date: 08/20/2020 CLINICAL DATA:  Preop evaluation for upcoming cervical surgery EXAM: CHEST - 2 VIEW COMPARISON:  05/26/2019 FINDINGS: Cardiac shadow is at the upper limits of normal in size. Lungs are well aerated bilaterally. No focal infiltrate or sizable effusion is seen. Postsurgical changes in the cervical spine are noted.  IMPRESSION: No acute abnormality noted. Electronically Signed   By: Inez Catalina M.D.   On: 08/20/2020 01:34   DG Cervical Spine 2-3 Views  Result Date: 08/20/2020 CLINICAL DATA:  Intraoperative localization EXAM: CERVICAL SPINE - 2-3 VIEW; DG C-ARM 1-60 MIN COMPARISON:  Cervical spine CT 10/31/2019 FINDINGS: Pre-existing ventral plate at E3-6 and O2-9. Hemostatic clamps are reportedly on a spinous process which is best localized at C5 based on the lateral view where the clamps are seen directed in line with the C5 ACDF screws. A clamp is above this level in projection on the frontal view due to degree of neck flexion, per report. Case discussed with Dr. [name] in the OR. IMPRESSION: Intraoperative localization made difficult by patient anatomy. The hemostats are in closest proximity to the C5 spinous process. Electronically Signed   By: Monte Fantasia M.D.   On: 08/20/2020 09:51   DG C-Arm 1-60 Min  Result Date: 08/20/2020 CLINICAL DATA:  Intraoperative localization EXAM: CERVICAL SPINE - 2-3 VIEW; DG C-ARM 1-60 MIN COMPARISON:  Cervical spine CT 10/31/2019 FINDINGS: Pre-existing ventral plate at U7-6 and L4-6. Hemostatic clamps are reportedly on a spinous process which is best localized at C5 based on the lateral view where the clamps are seen directed in line with the C5 ACDF screws. A clamp is  above this level in projection on the frontal view due to degree of neck flexion, per report. Case discussed with Dr. [name] in the OR. IMPRESSION: Intraoperative localization made difficult by patient anatomy. The hemostats are in closest proximity to the C5 spinous process. Electronically Signed   By: Monte Fantasia M.D.   On: 08/20/2020 09:51    Disposition: Discharge disposition: 01-Home or Self Care       Discharge Instructions    Call MD / Call 911   Complete by: As directed    If you experience chest pain or shortness of breath, CALL 911 and be transported to the hospital emergency room.  If you develope a fever above 101 F, pus (white drainage) or increased drainage or redness at the wound, or calf pain, call your surgeon's office.   Constipation Prevention   Complete by: As directed    Drink plenty of fluids.  Prune juice may be helpful.  You may use a stool softener, such as Colace (over the counter) 100 mg twice a day.  Use MiraLax (over the counter) for constipation as needed.   Diet - low sodium heart healthy   Complete by: As directed    Discharge instructions   Complete by: As directed    No lifting greater than 10 lbs. Avoid bending, stooping and twisting. Walking in house for first week then may start to get out slowly increasing activity using arms. Keep incision dry for 3 days, may use tegaderm or similar water impervious dressing. Avoid overhead use of arms and overhead lifting. Wear collar for comfort. Use ice as needed for comfort. Metformin should be stopped for 3 days following surgery to decrease risk of renal injury.   Driving restrictions   Complete by: As directed    No driving for 3 weeks   Increase activity slowly as tolerated   Complete by: As directed    Lifting restrictions   Complete by: As directed    No lifting for 12 weeks       Follow-up Information    Jessy Oto, MD In 2 weeks.   Specialty: Orthopedic Surgery Why: For wound  re-check Contact information:  [address] [place] [address] [phone]                Signed: [name] 08/21/2020, 12:34 PM

## 2020-08-21 NOTE — Evaluation (Signed)
Physical Therapy Evaluation Patient Details Name: Mark Harrell MRN: [id number] DOB: 09-19-1960 Today's Date: 08/21/2020   History of Present Illness  Pt is a 60 y/o male who presents s/p C6-T1 foraminotomy and C7-T1 excision of disc herniation on 08/20/2020. PMH significant for prostatitis, pre-diabetes, neuropathy, HTN, DOE, ACDF 2020.    Clinical Impression  Pt admitted with above diagnosis. At the time of PT eval, pt was able to demonstrate transfers and ambulation with gross min guard assist and SPC for support. Pt was educated on precautions, brace application/wearing schedule, appropriate activity progression, positioning recommendations and car transfer. Pt currently with functional limitations due to the deficits listed below (see PT Problem List). Pt will benefit from skilled PT to increase their independence and safety with mobility to allow discharge to the venue listed below.      Follow Up Recommendations Supervision for mobility/OOB;No PT follow up (Pt declining HHPT)    Equipment Recommendations  None recommended by PT    Recommendations for Other Services       Precautions / Restrictions Precautions Precautions: Fall;Cervical Precaution Booklet Issued: Yes (comment) Precaution Comments: Reviewed handout and pt was cued for precautions during functional mobility. Required Braces or Orthoses: Cervical Brace Restrictions Weight Bearing Restrictions: No      Mobility  Bed Mobility               General bed mobility comments: Pt was received sitting up in the recliner. Pt reports he has been sleeping in the recliner at home since his last surgery (2020) and does not plan to return to the bed.    Transfers Overall transfer level: Needs assistance Equipment used: Straight cane Transfers: Sit to/from Stand Sit to Stand: Min guard         General transfer comment: Increased time and close guard for safety. No assist required with  SPC.  Ambulation/Gait Ambulation/Gait assistance: Min guard;Supervision Gait Distance (Feet): 200 Feet Assistive device: Straight cane Gait Pattern/deviations: Step-through pattern;Decreased stride length;Trunk flexed Gait velocity: Decreased Gait velocity interpretation: 1.31 - 2.62 ft/sec, indicative of limited community ambulator General Gait Details: VC's for improved posture. Pt with 3/4 dyspnea after ~100' requiring several standing rest breaks.  Stairs            Wheelchair Mobility    Modified Rankin (Stroke Patients Only)       Balance Overall balance assessment: Needs assistance Sitting-balance support: Feet supported;No upper extremity supported Sitting balance-Leahy Scale: Fair     Standing balance support: No upper extremity supported;During functional activity Standing balance-Leahy Scale: Poor Standing balance comment: Reliant on SPC for support during dynamic standing activity.                             Pertinent Vitals/Pain      Home Living Family/patient expects to be discharged to:: Private residence Living Arrangements: Alone Available Help at Discharge: Family;Available PRN/intermittently Type of Home: House Home Access: Stairs to enter Entrance Stairs-Rails: None Entrance Stairs-Number of Steps: 2 Home Layout: Two level;Able to live on main level with bedroom/bathroom Home Equipment: Kasandra Knudsen - single point Additional Comments: Uncle can assist intermittently (bringing in food) but otherwise cannot physically assist him.    Prior Function Level of Independence: Independent with assistive device(s)         Comments: SPC     Hand Dominance   Dominant Hand: Right    Extremity/Trunk Assessment   Upper Extremity Assessment Upper Extremity Assessment: RUE  deficits/detail;LUE deficits/detail RUE Deficits / Details: Numbness in fingertips LUE Deficits / Details: Pain through upper trap and into shoulder limiting AROM     Lower Extremity Assessment Lower Extremity Assessment: Generalized weakness    Cervical / Trunk Assessment Cervical / Trunk Assessment: Other exceptions Cervical / Trunk Exceptions: s/p surgery  Communication   Communication: No difficulties  Cognition Arousal/Alertness: Awake/alert Behavior During Therapy: WFL for tasks assessed/performed Overall Cognitive Status: Within Functional Limits for tasks assessed                                        General Comments      Exercises     Assessment/Plan    PT Assessment Patient needs continued PT services  PT Problem List Decreased strength;Decreased activity tolerance;Decreased balance;Decreased mobility;Decreased knowledge of use of DME;Decreased safety awareness;Decreased knowledge of precautions;Pain       PT Treatment Interventions DME instruction;Gait training;Stair training;Functional mobility training;Therapeutic activities;Therapeutic exercise;Neuromuscular re-education;Patient/family education    PT Goals (Current goals can be found in the Care Plan section)  Acute Rehab PT Goals Patient Stated Goal: Decreased pain PT Goal Formulation: With patient    Frequency Min 5X/week   Barriers to discharge Decreased caregiver support Limited assistance available    Co-evaluation               AM-PAC PT "6 Clicks" Mobility  Outcome Measure Help needed turning from your back to your side while in a flat bed without using bedrails?: A Lot Help needed moving from lying on your back to sitting on the side of a flat bed without using bedrails?: A Lot Help needed moving to and from a bed to a chair (including a wheelchair)?: A Little Help needed standing up from a chair using your arms (e.g., wheelchair or bedside chair)?: A Little Help needed to walk in hospital room?: A Little Help needed climbing 3-5 steps with a railing? : A Little 6 Click Score: 16    End of Session Equipment Utilized During  Treatment: Gait belt;Cervical collar Activity Tolerance: Patient limited by pain;Patient limited by fatigue Patient left: in chair;with call bell/phone within reach Nurse Communication: Mobility status PT Visit Diagnosis: Unsteadiness on feet (R26.81);Pain Pain - part of body:  (shoulders, neck)    Time: 2585-2778 PT Time Calculation (min) (ACUTE ONLY): 18 min   Charges:   PT Evaluation $PT Eval Low Complexity: 1 Low          Rolinda Roan, PT, DPT Acute Rehabilitation Services Pager: [phone] Office: [phone]   Thelma Comp 08/21/2020, 10:26 AM

## 2020-08-21 NOTE — Progress Notes (Signed)
     Subjective: 1 Day Post-Op Procedure(s) (LRB): LEFT CERVICAL SIX THROUGH SEVEN  AND CERVICAL SEVEN THROUGH THORACIC ONE  FORAMINOTOMIES WITH EXCISION OF DISC HERNIATION LEFT CERVICAL SEVEN THROUGH THORACIC ONE (N/A)Awake, alert and oriented x 4. Foley is discontinued and he is voiding normally. Complains of left and right arm Numbness right side is into the ulnar 2 digits c/w cubital tunnnel symptoms.  Left shoulder and left arm are painful, reports similar to  Preop. Walking in the hallway. Dressing is dry.   Patient reports pain as moderate.    Objective:   VITALS:  Temp:  [97.9 F (36.6 C)-99.3 F (37.4 C)] 97.9 F (36.6 C) (03/29 0750) Pulse Rate:  [92-112] 94 (03/29 0909) Resp:  [13-20] 18 (03/29 0909) BP: (131-173)/(76-99) 152/88 (03/29 0750) SpO2:  [92 %-95 %] 94 % (03/29 0909)  Neurologically intact ABD soft Neurovascular intact Sensation intact distally Intact pulses distally Dorsiflexion/Plantar flexion intact Incision: dressing C/D/I   LABS Recent Labs    08/21/20 0424  HGB 14.9  WBC 16.2*  PLT 251   Recent Labs    08/21/20 0424  NA 135  K 4.5  CL 98  CO2 29  BUN 12  CREATININE 0.83  GLUCOSE 174*   No results for input(s): LABPT, INR in the last 72 hours.   Assessment/Plan: 1 Day Post-Op Procedure(s) (LRB): LEFT CERVICAL SIX THROUGH SEVEN  AND CERVICAL SEVEN THROUGH THORACIC ONE  FORAMINOTOMIES WITH EXCISION OF DISC HERNIATION LEFT CERVICAL SEVEN THROUGH THORACIC ONE (N/A)  Advance diet Up with therapy Discharge home with home health  [name] 08/21/2020, 12:24 PMPatient ID: Mark Harrell, male   DOB: [birth year], 60 y.o.   MRN: [id number]

## 2020-08-21 NOTE — Evaluation (Signed)
Occupational Therapy Evaluation Patient Details Name: Mark Harrell MRN: [id number] DOB: 10/28/1960 Today's Date: 08/21/2020    History of Present Illness Pt is a 60 y/o male who presents s/p C6-T1 foraminotomy and C7-T1 excision of disc herniation on 08/20/2020. PMH significant for prostatitis, pre-diabetes, neuropathy, HTN, DOE, ACDF 2020.   Clinical Impression   PTA patient was living living alone in a private residence with PRN assist from his uncle and was grossly Mod I with ADLs/IADLs with intermittent use of SPC in home. Patient reports use of SPC for community mobility. Patient states that his uncle provides transportation PRN. Patient currently presents near baseline level of function demonstrating observed ADLs with supervision A grossly and increased time 2/2 chronic and acute pain. OT provided education on cervical precautions, home set-up to maximize safety and independence with self-care tasks, and acquisition/use of AE. Patient expressed verbal understanding. Patient would benefit from continued acute OT services in prep for safe d/c home with PRN assist from family. Patient declining HH therapies. Would benefit from OPOT to address AROM, [place], and strength deficits in bilateral UE's.     Follow Up Recommendations  Follow surgeon's recommendation for DC plan and follow-up therapies (Patient declining HH therapies. Would benefit from [place] when cleared by MD.)    Equipment Recommendations  None recommended by OT    Recommendations for Other Services       Precautions / Restrictions Precautions Precautions: Fall;Cervical Precaution Booklet Issued: Yes (comment) Precaution Comments: Reviewed written handout. Patient requires occasional cues for adherence to cervical precautions. Required Braces or Orthoses: Cervical Brace Restrictions Weight Bearing Restrictions: No      Mobility Bed Mobility               General bed mobility comments: Patient standing in room upon  entry.    Transfers Overall transfer level: Needs assistance Equipment used: Straight cane Transfers: Sit to/from Stand Sit to Stand: Supervision         General transfer comment: Sit to stand from recliner with supervision A for safety. Patient    Balance Overall balance assessment: Needs assistance Sitting-balance support: Feet supported;No upper extremity supported Sitting balance-Leahy Scale: Fair     Standing balance support: No upper extremity supported;During functional activity Standing balance-Leahy Scale: Poor Standing balance comment: Reliant on SPC for support during dynamic standing activity.                           ADL either performed or assessed with clinical judgement   ADL Overall ADL's : Needs assistance/impaired                 Upper Body Dressing : Set up;Sitting Upper Body Dressing Details (indicate cue type and reason): Donned UB clothing seated in recliner with set-up assist and compensatory technique for pain management. Lower Body Dressing: Minimal assistance Lower Body Dressing Details (indicate cue type and reason): Min A to don socks. Patient reports his uncle assists with donning footwear. Patient able to don slip on shoes with use of shoehorn. Toilet Transfer: Copy Details (indicate cue type and reason): Simulated with transfer to recliner. Supervision A for safety.         Functional mobility during ADLs: Supervision/safety General ADL Comments: Patient greatly limited by decreased activity tolerance, decreased standing tolerance, and pain at incision and in LUE.     Vision         Perception     Praxis  Pertinent Vitals/Pain Pain Assessment: 0-10 Pain Score: 8  Pain Location: Cervical neck (incisional) Pain Descriptors / Indicators: Stabbing     Hand Dominance Right   Extremity/Trunk Assessment Upper Extremity Assessment Upper Extremity Assessment: RUE  deficits/detail;LUE deficits/detail RUE Deficits / Details: Numbness in digits 4 and 5 (new onset since surgery) LUE Deficits / Details: Patient reports bilateral clavicular fractures several years ago. Continued pain through upper trap and into shoulder limiting AROM. Patient also reports numbness in digits 1-5 PTA.   Lower Extremity Assessment Lower Extremity Assessment: Defer to PT evaluation   Cervical / Trunk Assessment Cervical / Trunk Assessment: Other exceptions Cervical / Trunk Exceptions: s/p surgery   Communication Communication Communication: No difficulties   Cognition Arousal/Alertness: Awake/alert Behavior During Therapy: WFL for tasks assessed/performed Overall Cognitive Status: Within Functional Limits for tasks assessed                                     General Comments  Clean, dry dressing at incision.    Exercises     Shoulder Instructions      Home Living Family/patient expects to be discharged to:: Private residence Living Arrangements: Alone Available Help at Discharge: Family;Available PRN/intermittently Type of Home: House Home Access: Stairs to enter [employer] of Steps: 2 Entrance Stairs-Rails: None Home Layout: Two level;Able to live on main level with bedroom/bathroom (Bedroom/bathroom on main level) Alternate Level Stairs-Number of Steps: Flight to 2nd level. Patient states that he does not go upstairs.   Bathroom Shower/Tub: Gaffer;Tub only (Has walk-in separtate from tub.)   Bathroom Toilet: Handicapped height     Home Equipment: [place] - single point;Adaptive equipment Adaptive Equipment: Reacher;Long-handled shoe horn Additional Comments: Patient reports that uncle assist with IADLs including meal prep. Uncle also provides transportation PRN.      Prior Functioning/Environment Level of Independence: Independent with assistive device(s)        Comments: SPC intermittently in home. Uses SPC for  community mobility. Sleeps in a recliner since surgery in 2020.        OT Problem List: Decreased strength;Decreased range of motion;Decreased activity tolerance;Impaired balance (sitting and/or standing);Decreased knowledge of precautions;Impaired sensation;Impaired UE functional use;Pain      OT Treatment/Interventions: Self-care/ADL training;Therapeutic exercise;Neuromuscular education;Energy conservation;Therapeutic activities;Patient/family education;Balance training    OT Goals(Current goals can be found in the care plan section) Acute Rehab OT Goals Patient Stated Goal: Decreased pain OT Goal Formulation: With patient Time For Goal Achievement: 09/04/20 Potential to Achieve Goals: Good ADL Goals Additional ADL Goal #1: Patient will recall 3/3 cervical precautions in prep for ADLs. Additional ADL Goal #2: Patient will complete a.m. ADLs with Mod I, LRAD, and good adherence to cervical precautions. Additional ADL Goal #3: Patient will demonstrate BUE HEP with I and use of written handout.  OT Frequency: Min 2X/week   Barriers to D/C: Decreased caregiver support  PRN assist from family.       Co-evaluation              AM-PAC OT "6 Clicks" Daily Activity     Outcome Measure Help from another person eating meals?: None Help from another person taking care of personal grooming?: A Little Help from another person toileting, which includes using toliet, bedpan, or urinal?: A Little Help from another person bathing (including washing, rinsing, drying)?: A Little Help from another person to put on and taking off regular upper body clothing?: A  Little Help from another person to put on and taking off regular lower body clothing?: A Little 6 Click Score: 19   End of Session Equipment Utilized During Treatment: Gait belt Nurse Communication: Mobility status  Activity Tolerance: Patient tolerated treatment well Patient left: in chair;with call bell/phone within reach  OT  Visit Diagnosis: Muscle weakness (generalized) (M62.81)                Time: 1287-8676 OT Time Calculation (min): 18 min Charges:  OT General Charges $OT Visit: 1 Visit OT Evaluation $OT Eval Low Complexity: 1 Low  Kirstin Kugler H. OTR/L Supplemental OT, Department of rehab services [phone]  Johnhenry Tippin R H. 08/21/2020, 10:44 AM

## 2020-08-23 DIAGNOSIS — G43719 Chronic migraine without aura, intractable, without status migrainosus: Secondary | ICD-10-CM | POA: Diagnosis not present

## 2020-08-24 ENCOUNTER — Encounter (HOSPITAL_COMMUNITY): Payer: Self-pay | Admitting: Specialist

## 2020-08-29 ENCOUNTER — Ambulatory Visit (INDEPENDENT_AMBULATORY_CARE_PROVIDER_SITE_OTHER): Payer: BC Managed Care – PPO | Admitting: Adult Health Nurse Practitioner

## 2020-08-29 ENCOUNTER — Encounter: Payer: Self-pay | Admitting: Adult Health Nurse Practitioner

## 2020-08-29 ENCOUNTER — Other Ambulatory Visit: Payer: Self-pay

## 2020-08-29 VITALS — BP 140/82 | HR 76 | Temp 97.7°F | Resp 20 | Wt 355.0 lb

## 2020-08-29 DIAGNOSIS — J449 Chronic obstructive pulmonary disease, unspecified: Secondary | ICD-10-CM | POA: Diagnosis not present

## 2020-08-29 DIAGNOSIS — J9611 Chronic respiratory failure with hypoxia: Secondary | ICD-10-CM

## 2020-08-29 DIAGNOSIS — Z9889 Other specified postprocedural states: Secondary | ICD-10-CM | POA: Diagnosis not present

## 2020-08-29 DIAGNOSIS — J4489 Other specified chronic obstructive pulmonary disease: Secondary | ICD-10-CM

## 2020-08-29 NOTE — Progress Notes (Signed)
Hospital follow up   Assessment and Plan: Hospital visit follow up for Left cervical 6-7 thoracic one, foraminotomies w/ excision of disc herniation [facility] was seen today for follow-up.  Diagnoses and all orders for this visit:  Asthma-COPD overlap syndrome ([place]) -     For home use only DME Nebulizer machine Continue Home O2 use Continue to monitor O2 at home Contact with new or worsening symptpoms Consider Chest xray if not improved  Chronic respiratory failure with hypoxia ([place]) -     For home use only DME Nebulizer machine  S/P cervical discectomy Discussed wound care Dicussed follow up with surgeon regarding extreme tenderness -     CBC with Differential/Platelet -     COMPLETE METABOLIC PANEL WITH GFR    All medications were reviewed with patient and family and fully reconciled. All questions answered fully, and patient and family members were encouraged to call the office with any further questions or concerns. Discussed goal to avoid readmission related to this diagnosis.  There are no discontinued medications.  CAN NOT DO FOR BCBS REGULAR OR MEDICARE Over 40 minutes of exam, counseling, chart review, and complex, high/moderate level critical decision making was performed this visit.   Future Appointments  Date Time Provider [place]  09/03/2020  2:00 PM Jessy Oto, MD [name]  09/27/2020  3:15 PM Jessy Oto, MD [name]  10/11/2020  3:15 PM [name], DPM [name]  11/06/2020  3:45 PM [name] Gwenith Daily, MD [name]  01/09/2021  3:00 PM [name], MD [name]     HPI 60 y.o.male presents for follow up for transition from recent hospitalization or SNIF stay. Admit date to the hospital was 08/20/20, patient was discharged from the hospital on 08/21/20 and our clinical staff contacted the office the day after discharge to set up a follow up appointment. The discharge summary, medications, and diagnostic test results  were reviewed before meeting with the patient. The patient was admitted for: posterior approach for cervical diskectomy 6-T1.  This is the patients first time out since 6 day ago Home health is not involved.  Though he reports he has family that is checking in on him and helping him with his care.  He is using oxygen 1.5L at home at night.  He has been wearing it during the day as well. He reports he does not have a small portable tank that he can carry around.  He reports his O2 saturation has been 94% with 1.5L via nasal cannula.  He reports he does have albuterol to use PRN but his nebulizer machine quit working.  Will place ordered today for new equipment and or evaluation.  He is using treligy daily and rinsing mouth after use.  For pain control he is taking 1,000mg  of acetaminophen every 8 hours.  He is not taking stronger pain medication as he was concerned as it was making it harder to breath.  He is wear neck brace to appointment today for travel.  He reports he is sleeping but but resting well related to discomfort in his neck.  He is wearing cerivical collar to appointment today.  He worse this continuously for 6 days and now wears only if leaving the home for extra stability.  This is the first trip out of his home since surgery.    There is a dry dressing midline posterior neck.  He reports the surgical bandage came off.  His uncle helped to place a  new dressing the the area.  Upon inspection the incision is well approximated, staples, with mild erythema noted.  There is raised gumball size are at 11 O'clock that is extremely tender, firm but fluctuant.  Follow up with Dr [name], in 8 days.  Discussed contact to office to have surgeon view this.  Scar tissue stitches vs fluid collection?    Images while in the hospital: DG Chest 2 View  Result Date: 08/20/2020 CLINICAL DATA:  Preop evaluation for upcoming cervical surgery EXAM: CHEST - 2 VIEW COMPARISON:  05/26/2019 FINDINGS: Cardiac  shadow is at the upper limits of normal in size. Lungs are well aerated bilaterally. No focal infiltrate or sizable effusion is seen. Postsurgical changes in the cervical spine are noted. IMPRESSION: No acute abnormality noted. Electronically Signed   By: Inez Catalina M.D.   On: 08/20/2020 01:34     Current Outpatient Medications (Endocrine & Metabolic):  .  metFORMIN (GLUCOPHAGE-XR) 500 MG 24 hr tablet, Takes 2 tablets 2 x /day with Breakfast & Supper  for Diabetes (Patient taking differently: Take 1,000 mg by mouth 2 (two) times daily. for Diabetes)  Current Outpatient Medications (Cardiovascular):  .  bisoprolol-hydrochlorothiazide (ZIAC) 5-6.25 MG tablet, TAKE 1 TABLET DAILY FOR BLOOD PRESSURE (Patient taking differently: Take 1 tablet by mouth daily. FOR BLOOD PRESSURE) .  EPINEPHrine 0.3 mg/0.3 mL IJ SOAJ injection, Inject 0.3 mg into the muscle as needed. (Patient taking differently: Inject 0.3 mg into the muscle as needed for anaphylaxis.) .  furosemide (LASIX) 80 MG tablet, TAKE 1 TO 1 & 1/2 TABLETS 2 X /DAY FOR FLUID RETENTION & SWELLING OF LEGS .  pravastatin (PRAVACHOL) 40 MG tablet, Take 1 tablet at Bedtime for Cholesterol (Patient taking differently: Take 40 mg by mouth at bedtime.) .  tadalafil (CIALIS) 5 MG tablet, Take 5 mg by mouth daily.   Current Outpatient Medications (Respiratory):  .  albuterol (VENTOLIN HFA) 108 (90 Base) MCG/ACT inhaler, 1 to 2 inhalations 10 to 15 minutes apart every 4 hrs to rescue Asthma (Patient taking differently: Inhale 1-2 puffs into the lungs every 4 (four) hours as needed (to rescue Asthma).) .  azelastine (ASTELIN) 0.1 % nasal spray, Use 1 to 2 sprays  Each Nostril 2 x /day (Patient taking differently: Place 2 sprays into both nostrils 2 (two) times daily.) .  cetirizine (ZYRTEC) 10 MG tablet, Take 10 mg by mouth daily. .  fluticasone (FLONASE) 50 MCG/ACT nasal spray, Use 1 to 2 sprays each Nostril 2 x /day (Patient taking differently: Place 2  sprays into both nostrils 2 (two) times daily.) .  Fluticasone-Umeclidin-Vilant (TRELEGY ELLIPTA) 100-62.5-25 MCG/INH AEPB, Inhale 1 puff into the lungs daily. (Patient taking differently: Inhale 1 puff into the lungs in the morning and at bedtime.) .  Guaifenesin 1200 MG TB12, Take 1,200 mg by mouth 2 (two) times daily. [name]  ipratropium-albuterol (DUONEB) 0.5-2.5 (3) MG/3ML SOLN, Take 3 mLs by nebulization every 4 (four) hours as needed. (Patient taking differently: Take 3 mLs by nebulization every 4 (four) hours as needed (Wheezing and congestion).) .  montelukast (SINGULAIR) 10 MG tablet, Take 1 tablet Daily for Allergies (Patient taking differently: Take 10 mg by mouth at bedtime.) .  oxymetazoline (AFRIN) 0.05 % nasal spray, Place 1 spray into both nostrils at bedtime. .  promethazine-dextromethorphan (PROMETHAZINE-DM) 6.25-15 MG/5ML syrup, TAKE 5 MLS BY MOUTH 4 (FOUR) TIMES DAILY AS NEEDED FOR COUGH. .  sodium chloride (OCEAN) 0.65 % SOLN nasal spray, Place 1 spray into both nostrils  4 (four) times daily as needed for congestion. Uses each time before other nasal sprays  Current Outpatient Medications (Analgesics):  .  acetaminophen (TYLENOL) 500 MG tablet, Take 1,000 mg by mouth every 6 (six) hours as needed for moderate pain. .  celecoxib (CELEBREX) 200 MG capsule, Take 1 capsule (200 mg total) by mouth every 12 (twelve) hours. .  traMADol (ULTRAM) 50 MG tablet, Take 1 tablet (50 mg total) by mouth every 6 (six) hours as needed. (Patient taking differently: Take 50 mg by mouth every 6 (six) hours as needed for moderate pain or severe pain.) .  HYDROcodone-acetaminophen (NORCO) 10-325 MG tablet, Take 1 tablet by mouth every 4 (four) hours as needed for up to 7 days for moderate pain ((score 4 to 6)).   Current Outpatient Medications (Other):  .  anastrozole (ARIMIDEX) 1 MG tablet, Take 1 mg by mouth daily. [name]  ascorbic acid (VITAMIN C) 1000 MG tablet, Take 1,000 mg by mouth every evening. .   Botulinum Toxin Type A 200 units SOLR, Inject 200 Units into the skin every 3 (three) months. .  cetaphil (CETAPHIL) lotion, Apply topically 2 (two) times daily. (Patient taking differently: Apply 1 application topically 2 (two) times daily.) .  Cholecalciferol (VITAMIN D3) 125 MCG (5000 UT) TABS, Take 5 tablets (25,000 Units total) by mouth See admin instructions. Take 15000 in the morning and 10,000 in the evening .  Cinnamon 500 MG capsule, Take 1,000 mg by mouth 3 (three) times daily. .  clotrimazole-betamethasone (LOTRISONE) cream, APPLY TO AFFECTED AREA TWICE A DAY (Patient taking differently: Apply 1 application topically 2 (two) times daily as needed (rash).) .  Coenzyme Q10 (CO Q 10 PO), Take 600 mg by mouth daily. .  cyclobenzaprine (FLEXERIL) 10 MG tablet, Take 10 mg by mouth 3 (three) times daily. .  diclofenac Sodium (VOLTAREN) 1 % GEL, APPLY 2 TO 4 GRAMS TOPICALLY 2 TO 4 TIMES DAILY FOR PAIN & INFLAMMATION (Patient taking differently: Apply 2-4 g topically 4 (four) times daily as needed (FOR PAIN & INFLAMMATION).) .  donepezil (ARICEPT) 23 MG TABS tablet, Take 23 mg by mouth daily. [name]  doxycycline (VIBRAMYCIN) 100 MG capsule, TAKE 1 CAPSULE BY MOUTH TWICE A DAY (Patient taking differently: Take 100 mg by mouth 2 (two) times daily.) .  dupilumab (DUPIXENT) 300 MG/2ML prefilled syringe, Inject 300 mg into the skin once a week. .  Eluxadoline 100 MG TABS, Take 100 mg by mouth 2 (two) times daily.  Javier Docker Oil 500 MG CAPS, Take 500 mg by mouth at bedtime. .  L-METHYLFOLATE-B6-B12 PO, Take 1 tablet by mouth daily. .  Magnesium 500 MG CAPS, Take 500 mg by mouth daily with supper. .  memantine (NAMENDA) 10 MG tablet, Take 10 mg by mouth 2 (two) times daily. .  Misc Natural Products (GLUCOS-CHONDROIT-MSM COMPLEX) TABS, Take 2 tablets by mouth 3 (three) times daily. .  Multiple Minerals-Vitamins (CITRACAL MAXIMUM PLUS) TABS, Take 1 tablet by mouth 2 (two) times daily. Vitamin D .  Multiple  Vitamins-Minerals (MULTIVITAMIN WITH MINERALS) tablet, Take 1 tablet by mouth daily. .  Multiple Vitamins-Minerals (PRESERVISION AREDS 2) CAPS, Take 1 capsule by mouth 2 (two) times daily. .  naloxone (NARCAN) nasal spray 4 mg/0.1 mL, Place 1 spray into the nose as needed (opioid overdose).  .  ondansetron (ZOFRAN-ODT) 4 MG disintegrating tablet, TAKE 1 TABLET (4 MG TOTAL) BY MOUTH EVERY 6 (SIX) HOURS AS NEEDED FOR NAUSEA OR VOMITING. .  pentosan polysulfate (ELMIRON) 100  MG capsule, Take 100 mg by mouth 2 (two) times daily. .  potassium chloride SA (KLOR-CON M20) 20 MEQ tablet, TAKE 1 TABLET TWICE DAILY FOR POTASSIUM (Patient taking differently: Take 20 mEq by mouth 2 (two) times daily. FOR POTASSIUM) .  pregabalin (LYRICA) 150 MG capsule, TAKE 1 CAPSULE 3 TIMES A DAY (Patient taking differently: Take 150 mg by mouth 3 (three) times daily.) .  PROBIOTIC PRODUCT PO, Take 1 capsule by mouth 2 (two) times daily. VSL .  sertraline (ZOLOFT) 50 MG tablet, Take 50 mg by mouth 2 (two) times daily.  .  trospium (SANCTURA) 20 MG tablet, Take 20 mg by mouth 2 (two) times daily. In the evening and at bedtime .  TURMERIC PO, Take 1,160 mg by mouth 3 (three) times daily. [name]  zinc gluconate 50 MG tablet, Take 50 mg by mouth 3 (three) times daily.  Past Medical History:  Diagnosis Date  . Anesthesia complication requiring reversal agent administration    ? from central apnea, very difficult to get off vent  . Anxiety   . Arthritis    osteo  . Asthma   . BPH (benign prostatic hyperplasia)   . Complication of anesthesia    difficulty waking , they twlight me because of my respiratory problems "  . Depression   . Dyspnea    on exertion  . Enlarged heart   . Family history of adverse reaction to anesthesia    mother trouble waking up, and heart stopped  . GERD (gastroesophageal reflux disease)   . Headache    botox injections for headaches  . Hyperlipidemia   . Hypertension   . Hypogonadism male   .  IBS (irritable bowel syndrome)   . Memory difficulties    short term memories  . Neuropathy   . Obesity   . On home oxygen therapy    on 2 liter  . OSA (obstructive sleep apnea)    not using cpap  . Paralysis ([place])    left hand - small  . Pneumonia   . Pre-diabetes   . Prostatitis      Allergies  Allergen Reactions  . Bee Venom Swelling  . Duloxetine Shortness Of Breath    Brought on asthma  . Ppd [Tuberculin Purified Protein Derivative] Other (See Comments)    +ppd NEG Quantferron Gold 3/13 (shows false positive)   . Fenofibrate Other (See Comments)    Back pain  . Verapamil Other (See Comments)    Back pain  . Claritin [Loratadine] Other (See Comments)    UNKNOWN   . Levofloxacin Diarrhea  . Other Diarrhea    "Some antibiotics" cause diarrhea    ROS: all negative except above.   Physical Exam: Filed Weights   08/29/20 1553  Weight: (!) 355 lb (161 kg)   BP 140/82   Pulse 76   Temp 97.7 F (36.5 C)   Resp 20   Wt (!) 355 lb (161 kg)   SpO2 94%   BMI 48.15 kg/m    General Appearance: Well nourished, in no apparent distress. Eyes: PERRLA, EOMs, conjunctiva no swelling or erythema Sinuses: No Frontal/maxillary tenderness ENT/Mouth: Ext aud canals clear, TMs without erythema, bulging. No erythema, swelling, or exudate on post pharynx.  Tonsils not swollen or erythematous. Hearing normal.  Neck: Supple, thyroid normal.  Respiratory: Respiratory effort labored, BS equal bilaterally without rales, rhonchi, wheezing or stridor.  Cardio: RRR with no MRGs. Brisk peripheral pulses without edema.  Abdomen: Soft, + BS,  obese.  Non tender, no guarding, rebound, hernias, masses. Lymphatics: Non tender without lymphadenopathy.  Musculoskeletal: Full ROM, 5/5 strength, normal gait.  Skin: Warm, dry without rashes, lesions, ecchymosis. Surgical incision, well approximated, staples intact.  Erythema, mild.  Raised tender area to top left to incision. Neuro: Cranial nerves  intact. Normal muscle tone, no cerebellar symptoms. Sensation intact.  Psych: Awake and oriented X 3, normal affect, Insight and Judgment appropriate.      Garnet Sierras, Laqueta Jean, DNP [facility] 08/30/2020  11:19 AM

## 2020-08-30 ENCOUNTER — Other Ambulatory Visit: Payer: Self-pay | Admitting: Allergy & Immunology

## 2020-08-30 ENCOUNTER — Ambulatory Visit (INDEPENDENT_AMBULATORY_CARE_PROVIDER_SITE_OTHER): Payer: BC Managed Care – PPO | Admitting: Surgery

## 2020-08-30 ENCOUNTER — Encounter: Payer: Self-pay | Admitting: Surgery

## 2020-08-30 VITALS — BP 141/91 | HR 82 | Temp 99.0°F

## 2020-08-30 DIAGNOSIS — M5010 Cervical disc disorder with radiculopathy, unspecified cervical region: Secondary | ICD-10-CM

## 2020-08-30 LAB — CBC WITH DIFFERENTIAL/PLATELET
Absolute Monocytes: 1071 cells/uL — ABNORMAL HIGH (ref 200–950)
Basophils Absolute: 94 cells/uL (ref 0–200)
Basophils Relative: 0.9 %
Eosinophils Absolute: 239 cells/uL (ref 15–500)
Eosinophils Relative: 2.3 %
HCT: 45.1 % (ref 38.5–50.0)
Hemoglobin: 14.7 g/dL (ref 13.2–17.1)
Lymphs Abs: 3286 cells/uL (ref 850–3900)
MCH: 29.6 pg (ref 27.0–33.0)
MCHC: 32.6 g/dL (ref 32.0–36.0)
MCV: 90.9 fL (ref 80.0–100.0)
MPV: 10.7 fL (ref 7.5–12.5)
Monocytes Relative: 10.3 %
Neutro Abs: 5710 cells/uL (ref 1500–7800)
Neutrophils Relative %: 54.9 %
Platelets: 289 10*3/uL (ref 140–400)
RBC: 4.96 10*6/uL (ref 4.20–5.80)
RDW: 13 % (ref 11.0–15.0)
Total Lymphocyte: 31.6 %
WBC: 10.4 10*3/uL (ref 3.8–10.8)

## 2020-08-30 LAB — COMPLETE METABOLIC PANEL WITH GFR
AG Ratio: 1.7 (calc) (ref 1.0–2.5)
ALT: 19 U/L (ref 9–46)
AST: 17 U/L (ref 10–35)
Albumin: 3.8 g/dL (ref 3.6–5.1)
Alkaline phosphatase (APISO): 66 U/L (ref 35–144)
BUN: 16 mg/dL (ref 7–25)
CO2: 31 mmol/L (ref 20–32)
Calcium: 9.9 mg/dL (ref 8.6–10.3)
Chloride: 103 mmol/L (ref 98–110)
Creat: 0.78 mg/dL (ref 0.70–1.33)
GFR, Est African American: 115 mL/min/{1.73_m2} (ref >=60)
GFR, Est Non African American: 99 mL/min/{1.73_m2} (ref >=60)
Globulin: 2.2 g/dL (calc) (ref 1.9–3.7)
Glucose, Bld: 100 mg/dL (ref 65–139)
Potassium: 4.7 mmol/L (ref 3.5–5.3)
Sodium: 142 mmol/L (ref 135–146)
Total Bilirubin: 0.8 mg/dL (ref 0.2–1.2)
Total Protein: 6 g/dL — ABNORMAL LOW (ref 6.1–8.1)

## 2020-08-30 MED ORDER — CEPHALEXIN 500 MG PO CAPS: 500.0000 mg | ORAL_CAPSULE | Freq: Four times a day (QID) | ORAL | 0 refills | Status: DC

## 2020-08-30 NOTE — Progress Notes (Signed)
60 year old white male who is status post left C6-7 and C7-T1 foraminotomies August 12, 2020 returns.  Patient states that he was seen by his primary care physician yesterday for chronic medical issues and they had concerns about his surgical incision.  Patient states that they were concerned about something looking like a "nodule" along his incision.  Patient states that he has not had any drainage.  States that preop right hand symptoms have resolved and his left shoulder pain is much improved.  Continues have pain between his shoulder blades.  Exam Very pleasant white male alert and oriented in no acute distress.  Surgical incision staples are intact.  He does have expected postop swelling.  No drainage.  No gross signs of infection.  Neurologically intact.   Plan I did text Dr. [name] a picture of patient's incision site but he was not able to look at this today.  Patient is already on chronic doxycycline for pulmonary issues and this will also provide prophylactic coverage for his neck.  I do not think that he has an infection.  Reassurance given.  Dressing changed.  Patient had a follow-up appointment scheduled with me for next Thursday but I had this moved to Monday afternoon at 2 PM to see Dr. [name].  Patient knows to contact us if there are any questions or concerns.

## 2020-09-03 ENCOUNTER — Other Ambulatory Visit: Payer: Self-pay

## 2020-09-03 ENCOUNTER — Ambulatory Visit (INDEPENDENT_AMBULATORY_CARE_PROVIDER_SITE_OTHER): Payer: BC Managed Care – PPO | Admitting: Specialist

## 2020-09-03 ENCOUNTER — Encounter: Payer: Self-pay | Admitting: Specialist

## 2020-09-03 VITALS — BP 127/80 | HR 80 | Temp 98.4°F | Ht 72.0 in | Wt 356.0 lb

## 2020-09-03 DIAGNOSIS — Z981 Arthrodesis status: Secondary | ICD-10-CM

## 2020-09-03 MED ORDER — TRAMADOL HCL 50 MG PO TABS: 100.0000 mg | ORAL_TABLET | Freq: Four times a day (QID) | ORAL | 0 refills | Status: AC | PRN

## 2020-09-03 NOTE — Progress Notes (Signed)
Post-Op Visit Note   Patient: Mark Harrell           Date of Birth: 01-24-1961           MRN: [id number] Visit Date: 09/03/2020 PCP: [name], MD   Assessment & Plan:14 days post cervical laminotomies left C6-7 and C7-T1  Chief Complaint:  Chief Complaint  Patient presents with  . Neck - Routine Post Op  Incision is heallng, staples removed today. Motor is normal Incision has normal swelling on the left side of surgery from elevation of muscles. Standing and walking well.  No bowel or bladder difficulty. His right arm numbness is gone.The left arm with a little numbess. Still having pain in the neck. Visit Diagnoses: No diagnosis found.  Plan:   No lifting greater than 10 lbs. Walking outside of the house for first week then may start to get out slowly increasing activity using arms. Avoid overhead use of arms and overhead lifting. Wear collar for comfort. Use ice as needed for comfort. Hot shower.  Follow-Up Instructions: No follow-ups on file.   Orders:  No orders of the defined types were placed in this encounter.  No orders of the defined types were placed in this encounter.   Imaging: No results found.  PMFS History: Patient Active Problem List   Diagnosis Date Noted  . HNP (herniated nucleus pulposus) with myelopathy, cervical 05/23/2019    Priority: High    Class: Chronic  . Spinal stenosis of cervical region 05/23/2019    Priority: High    Class: Chronic  . Status post cervical discectomy 08/20/2020  . Tracheomalacia 05/26/2019  . Acquired tracheomalacia 05/26/2019  . Status post cervical spinal fusion 05/23/2019  . Chronic respiratory failure with hypoxia ([place]) 04/12/2018  . Bilateral lower extremity edema 12/29/2017  . Hymenoptera allergy 11/10/2017  . Diverticulosis 08/07/2017  . Family history of colonic polyps 08/07/2017  . Myofascial pain 08/06/2017  . Seasonal and perennial allergic rhinitis 04/23/2017  . Munchausen syndrome  04/14/2017  . Cervicalgia 04/01/2017  . Cervical spondylosis with radiculopathy 02/09/2017  . Memory difficulty 12/05/2016  . Morbid obesity ([place]) 12/05/2016  . Migraine without aura and without status migrainosus, not intractable 10/30/2016  . Lumbar radiculopathy 05/15/2016  . Osteoarthritis of spine with radiculopathy, lumbar region 05/15/2016  . Risk for falls 05/15/2016  . Recurrent infections 03/29/2016  . Chronic nonseasonal allergic rhinitis due to pollen 03/29/2016  . Polypharmacy 01/16/2016  . Morbid obesity with BMI of 50.0-59.9, adult ([place]) 03/06/2015  . SDAT 02/05/2015  . OSA and COPD overlap syndrome ([place]) 02/05/2015  . Medication management 08/02/2014  . GERD (gastroesophageal reflux disease) 05/09/2014  . Vitamin D deficiency 08/01/2013  . Prediabetes 08/01/2013  . 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 Diverticula of colon 05/07/2011  . Hypertension 01/31/2011  . Hyperlipidemia, mixed 01/31/2011  . BPH (benign prostatic hyperplasia) 01/31/2011  . Testosterone Deficiency 01/31/2011  . IBS (irritable bowel syndrome) 01/31/2011  . Partial complex seizure disorder with intractable epilepsy ([place]) 01/31/2011  . Depression, major, recurrent, in partial remission ([place]) 01/31/2011  . Asthma-COPD overlap syndrome ([place]) 01/31/2011   Past Medical History:  Diagnosis Date  . Anesthesia complication requiring reversal agent administration    ? from central apnea, very difficult to get off vent  . Anxiety   . Arthritis    osteo  . Asthma   . BPH (benign prostatic hyperplasia)   . Complication of anesthesia    difficulty waking , they twlight me because of  my respiratory problems "  . Depression   . Dyspnea    on exertion  . Enlarged heart   . Family history of adverse reaction to anesthesia    mother trouble waking up, and heart stopped  . GERD (gastroesophageal reflux disease)   . Headache    botox injections for headaches  . Hyperlipidemia   . Hypertension   .  Hypogonadism male   . IBS (irritable bowel syndrome)   . Memory difficulties    short term memories  . Neuropathy   . Obesity   . On home oxygen therapy    on 2 liter  . OSA (obstructive sleep apnea)    not using cpap  . Paralysis ([place])    left hand - small  . Pneumonia   . Pre-diabetes   . Prostatitis     Family History  Problem Relation Age of Onset  . Diabetes Paternal Uncle   . Cancer Father        lymphoma, colon  . Diabetes Maternal Grandmother   . Heart disease Maternal Grandfather   . Diabetes Maternal Grandfather   . Diabetes Paternal Grandmother   . Diabetes Paternal Grandfather   . Dementia Mother   . Prostate cancer Maternal Uncle   . Lung disease Neg Hx   . Rheumatologic disease Neg Hx     Past Surgical History:  Procedure Laterality Date  . ABDOMINAL SURGERY    . ANKLE FRACTURE SURGERY Right   . ANTERIOR CERVICAL DECOMP/DISCECTOMY FUSION N/A 05/23/2019   Procedure: ANTERIOR CERVICAL DISCECTOMY FUSION CERVICAL FIVE THROUGH CERVICAL SIX AND CERVICAL SIX THROUGH CERVICAL SEVEN;  Surgeon: Jessy Oto, MD;  Location: [facility];  Service: Orthopedics;  Laterality: N/A;  . COLONOSCOPY    . CYSTOSCOPY     [place]  . KNEE ARTHROSCOPY WITH MEDIAL MENISECTOMY Left 01/02/2017   Procedure: LEFT KNEE ARTHROSCOPY WITH PARTIAL MEDIAL MENISCECTOMY;  Surgeon: Mcarthur Rossetti, MD;  Location: [facility];  Service: Orthopedics;  Laterality: Left;  . POSTERIOR CERVICAL FUSION/FORAMINOTOMY N/A 08/20/2020   Procedure: LEFT CERVICAL SIX THROUGH SEVEN  AND CERVICAL SEVEN THROUGH THORACIC ONE  FORAMINOTOMIES WITH EXCISION OF [place] HERNIATION LEFT CERVICAL SEVEN THROUGH THORACIC ONE;  Surgeon: Jessy Oto, MD;  Location: [facility];  Service: Orthopedics;  Laterality: N/A;  . TONSILLECTOMY    . [place] RESECTION  2007  . UVULOPALATOPHARYNGOPLASTY     Social History   Occupational History  . Occupation: Dance movement psychotherapist  Tobacco Use  . Smoking status: Former Smoker     Packs/day: 0.10    Years: 15.00    Pack years: 1.50  . Smokeless tobacco: Never Used  . Tobacco comment: significant second-hand exposure through mother  Vaping Use  . Vaping Use: Never used  Substance and Sexual Activity  . Alcohol use: Yes    Alcohol/week: 1.0 standard drink    Types: 1 Glasses of wine per week    Comment: 2 x a year  . Drug use: No  . Sexual activity: Never

## 2020-09-03 NOTE — Patient Instructions (Addendum)
No lifting greater than 10 lbs. Walking outside of the house for first week then may start to get out slowly increasing activity using arms. Avoid overhead use of arms and overhead lifting. Wear collar for comfort. Use ice as needed for comfort. Hot shower. Continue use of Doxycycline for pulmonary prophylaxis vs infection.

## 2020-09-04 ENCOUNTER — Telehealth: Payer: Self-pay | Admitting: *Deleted

## 2020-09-04 NOTE — Telephone Encounter (Signed)
Left message to inform patient he is not eligible for a new nebulizer at this time. Received a nebulizer on 01/18/2020.

## 2020-09-06 ENCOUNTER — Inpatient Hospital Stay: Payer: BC Managed Care – PPO | Admitting: Surgery

## 2020-09-14 DIAGNOSIS — J9611 Chronic respiratory failure with hypoxia: Secondary | ICD-10-CM | POA: Diagnosis not present

## 2020-09-14 DIAGNOSIS — J449 Chronic obstructive pulmonary disease, unspecified: Secondary | ICD-10-CM | POA: Diagnosis not present

## 2020-09-15 DIAGNOSIS — M5010 Cervical disc disorder with radiculopathy, unspecified cervical region: Secondary | ICD-10-CM

## 2020-09-27 ENCOUNTER — Ambulatory Visit: Payer: BC Managed Care – PPO | Admitting: Specialist

## 2020-10-11 ENCOUNTER — Ambulatory Visit: Payer: BC Managed Care – PPO | Admitting: Podiatry

## 2020-10-11 ENCOUNTER — Other Ambulatory Visit: Payer: Self-pay

## 2020-10-11 ENCOUNTER — Encounter: Payer: Self-pay | Admitting: Podiatry

## 2020-10-11 DIAGNOSIS — Q828 Other specified congenital malformations of skin: Secondary | ICD-10-CM

## 2020-10-11 DIAGNOSIS — M79676 Pain in unspecified toe(s): Secondary | ICD-10-CM

## 2020-10-11 DIAGNOSIS — B351 Tinea unguium: Secondary | ICD-10-CM

## 2020-10-14 DIAGNOSIS — J449 Chronic obstructive pulmonary disease, unspecified: Secondary | ICD-10-CM | POA: Diagnosis not present

## 2020-10-14 DIAGNOSIS — J9611 Chronic respiratory failure with hypoxia: Secondary | ICD-10-CM | POA: Diagnosis not present

## 2020-10-14 NOTE — Progress Notes (Signed)
He presents today chief complaint of painful elongated toenails.  States the plantar fasciitis is doing better.  Objective: Vital signs are stable he is alert oriented x3.  Pulses are palpable.  There is no erythema edema cellulitis drainage or odor nails are long thick yellow dystrophic onychomycotic painful palpation as well as debridement.  No pain on palpation of the plantar fascia.  Assessment: Pain in limb secondary to onychomycosis well-healed plan fasciitis.  Plan: Follow-up with me on an as-needed basis or in 3 months.

## 2020-10-30 IMAGING — DX DG CHEST 1V PORT
1 series · 1 of 1 positions shown · non-contrast
Comparison: 05/18/2019

CLINICAL DATA: Shortness of breath

EXAM:
PORTABLE CHEST 1 VIEW

[chest ap]
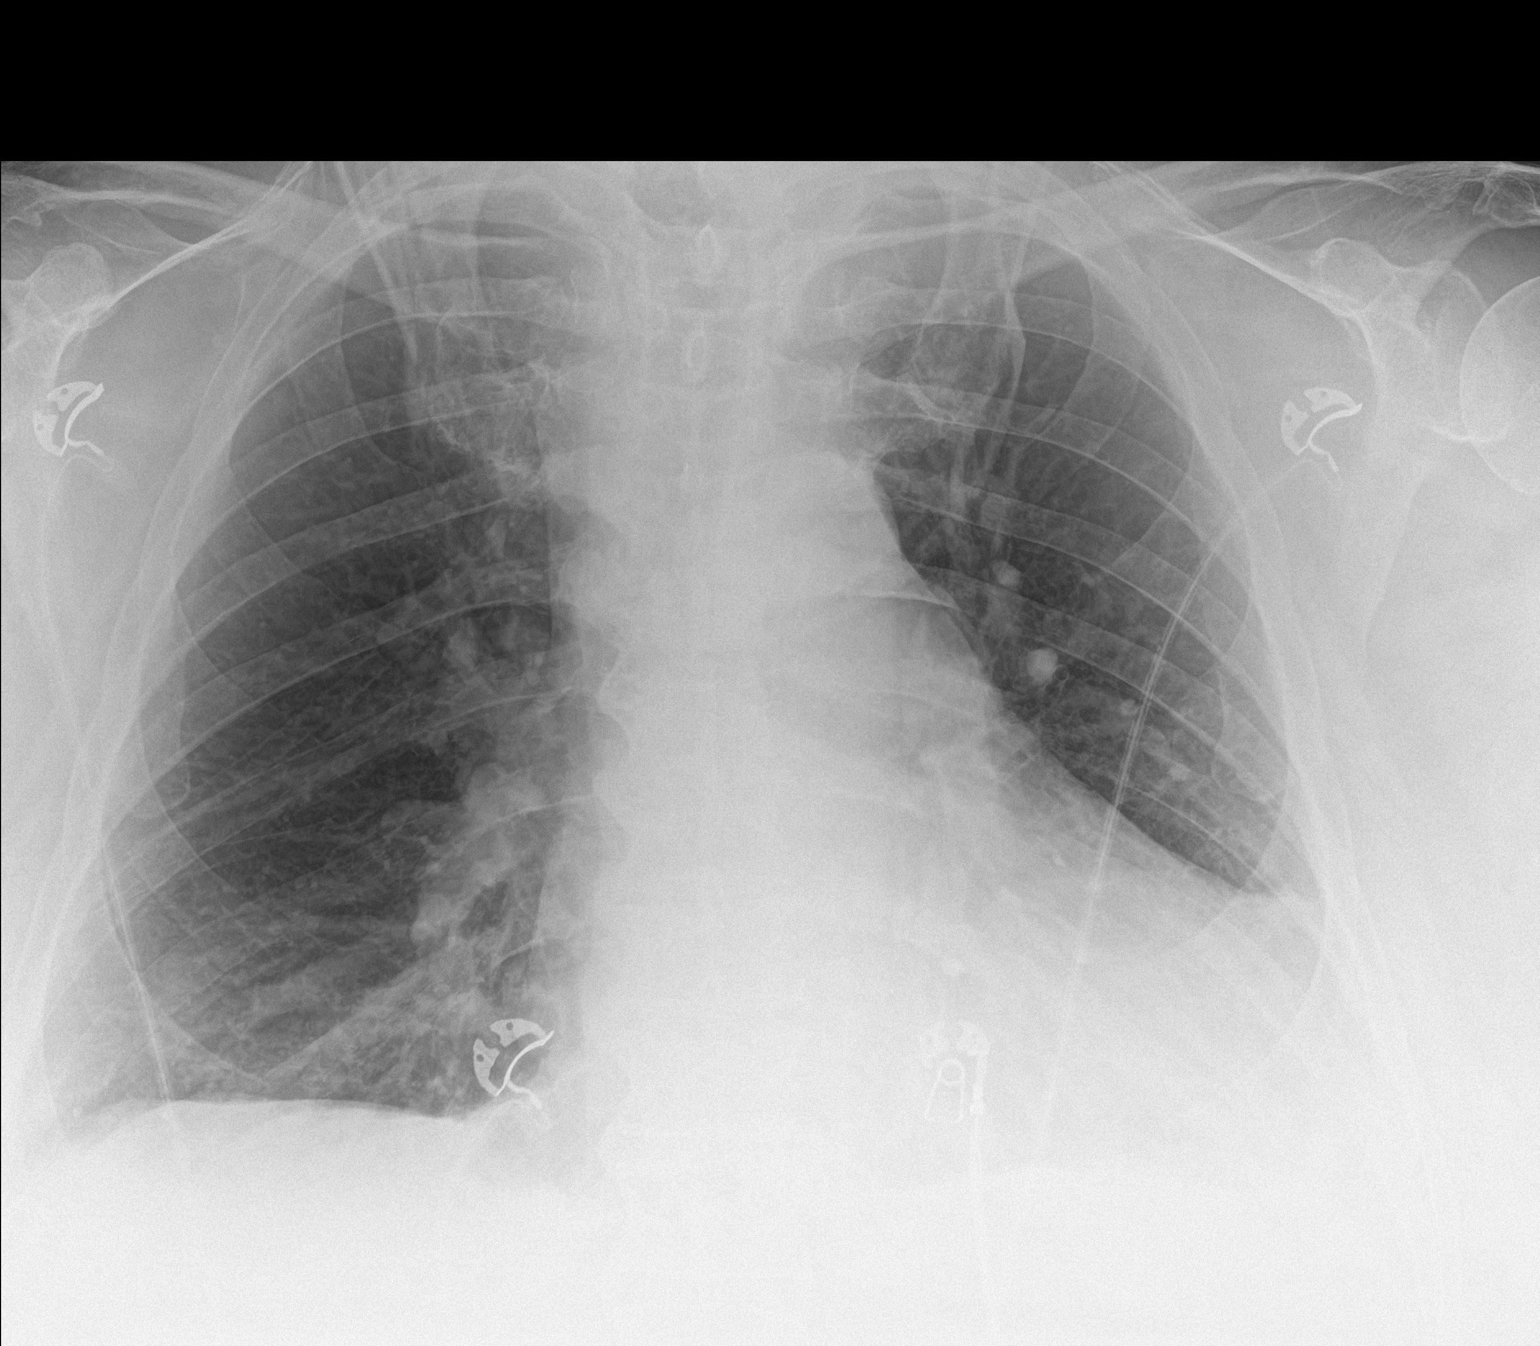

[1 of 1 positions shown; findings below may reference images not displayed]

FINDINGS: Cardiopericardial enlargement at least partially related to
mediastinal fat. There is no edema, consolidation, effusion, or
pneumothorax. Degenerative endplate spurring and ACDF hardware.
IMPRESSION: No active disease.

## 2020-10-31 ENCOUNTER — Other Ambulatory Visit: Payer: Self-pay | Admitting: Adult Health

## 2020-11-05 IMAGING — MR MR SHOULDER*L* W/O CM
5 series · 34 of 40 positions shown · non-contrast
Comparison: None.

CLINICAL DATA: Left shoulder pain painful range of motion. The
patient fell in February 2018. Diffuse upper extremity weakness.

EXAM:
MRI OF THE LEFT SHOULDER WITHOUT CONTRAST
TECHNIQUE: Multiplanar, multisequence MR imaging of the shoulder was performed.
No intravenous contrast was administered.

[Series 3: PD fat-sat · axial · 4.0mm · 0.62mm/px · z∈[-79,+31]mm · 8 of 25 slices shown (1 of 2)]
[im 1/25]
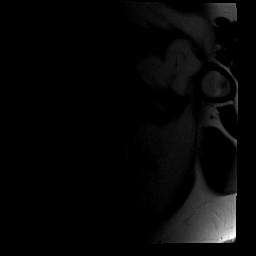
[im 3/25]
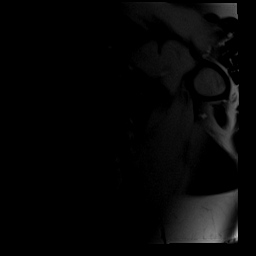
[im 9/25]
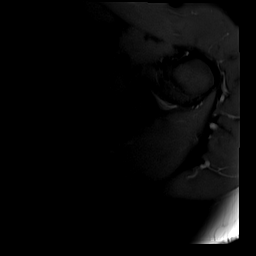
[im 11/25]
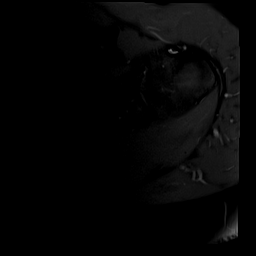
[im 14/25]
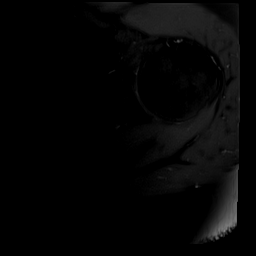
[im 17/25]
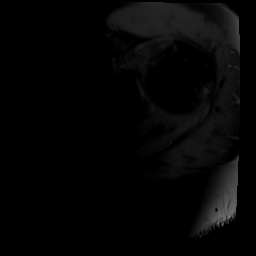
[im 22/25]
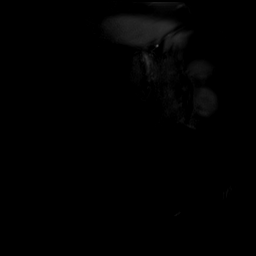
[im 25/25]
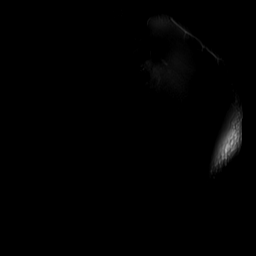

[Series 4: T2 fat-sat · oblique · 4.0mm · 0.66mm/px · 7 of 18 slices shown (1 of 2)]
[im 1/18]
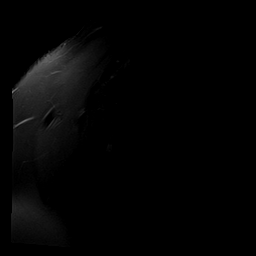
[im 3/18]
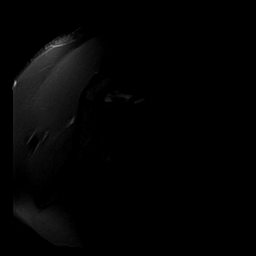
[im 6/18]
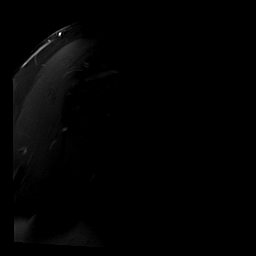
[im 9/18]
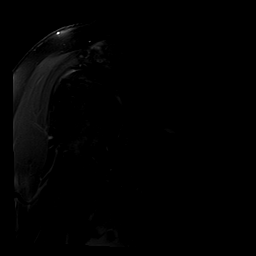
[im 12/18]
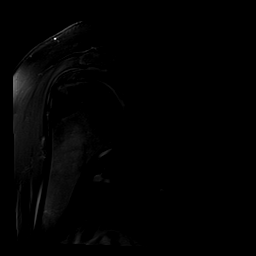
[im 15/18]
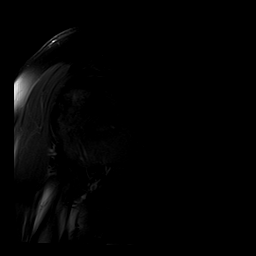
[im 18/18]
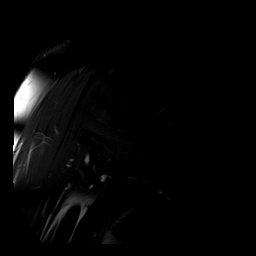

[Series 5: PD fat-sat · oblique · 4.0mm · 0.33mm/px · 7 of 18 slices shown (2 of 2)]
[im 1/18]
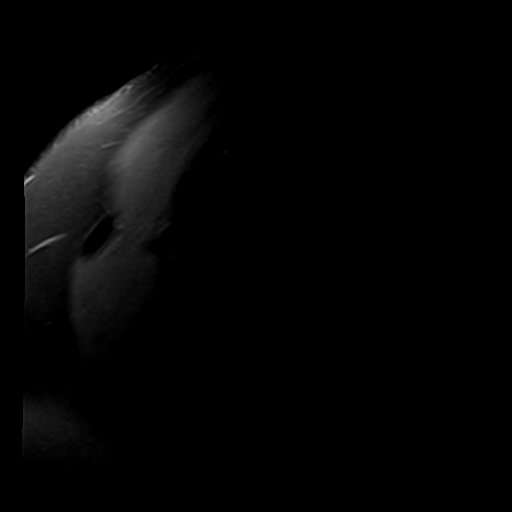
[im 3/18]
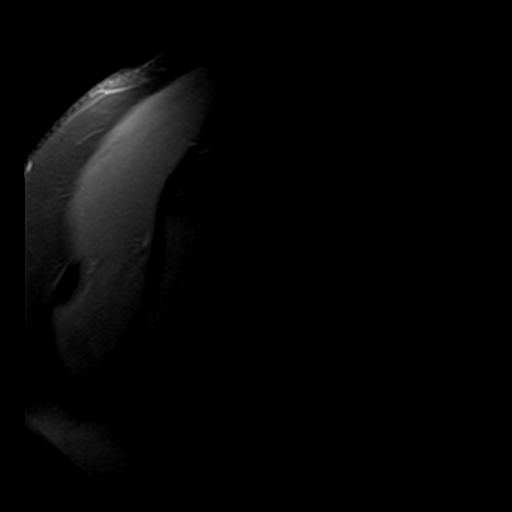
[im 6/18]
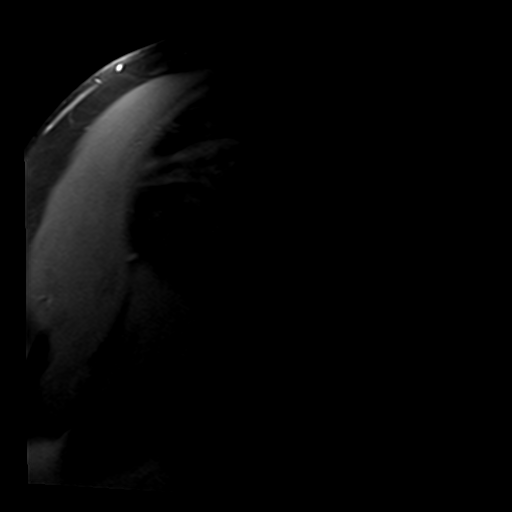
[im 9/18]
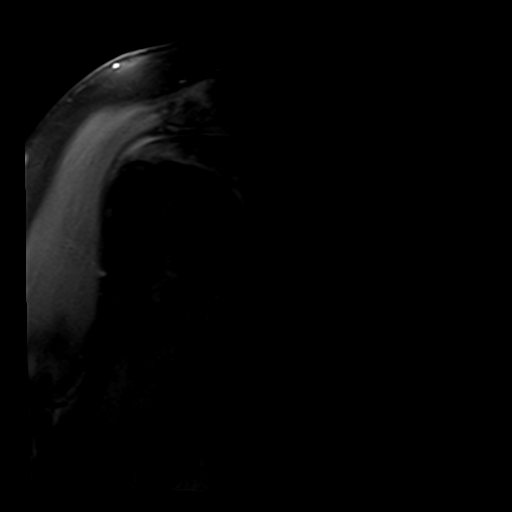
[im 12/18]
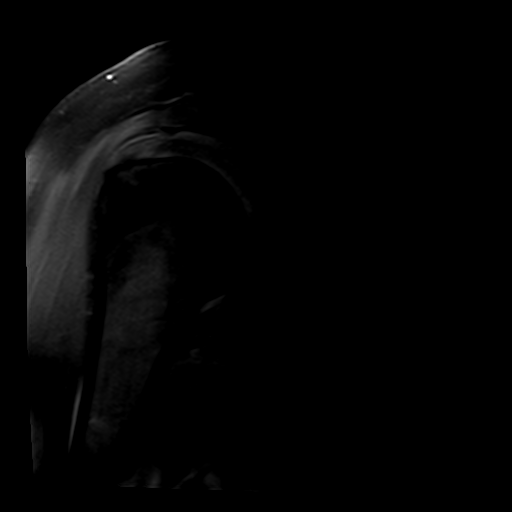
[im 15/18]
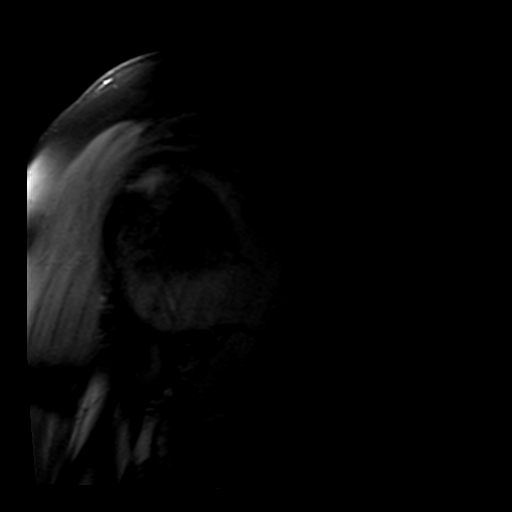
[im 18/18]
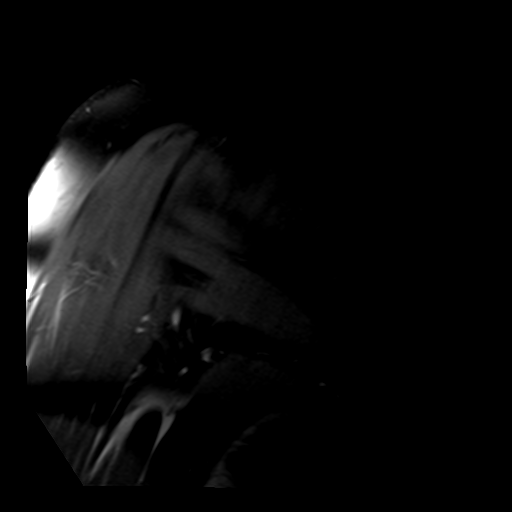

[Series 6: T1 · oblique · 4.0mm · 0.31mm/px · 6 of 24 slices shown]
[im 1/24]
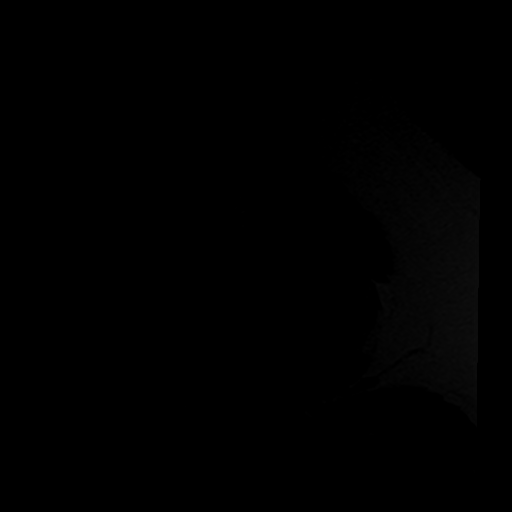
[im 3/24]
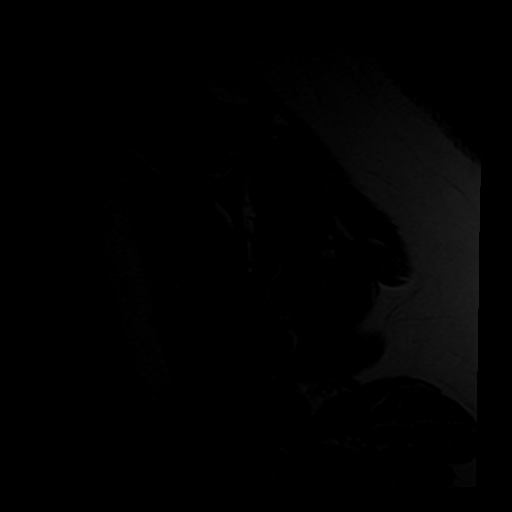
[im 8/24]
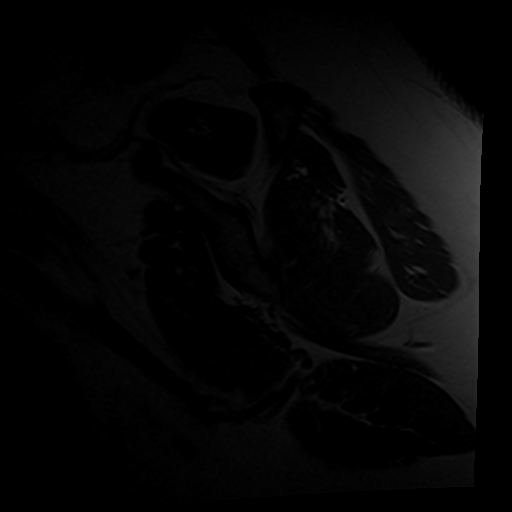
[im 11/24]
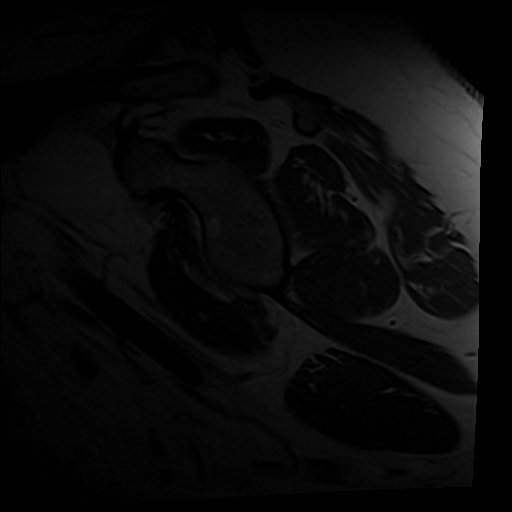
[im 13/24]
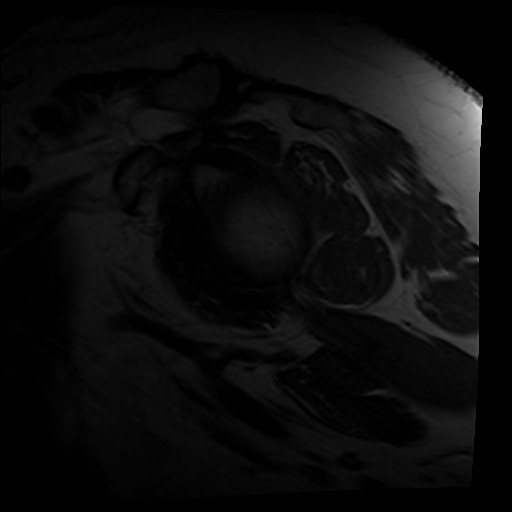
[im 16/24]
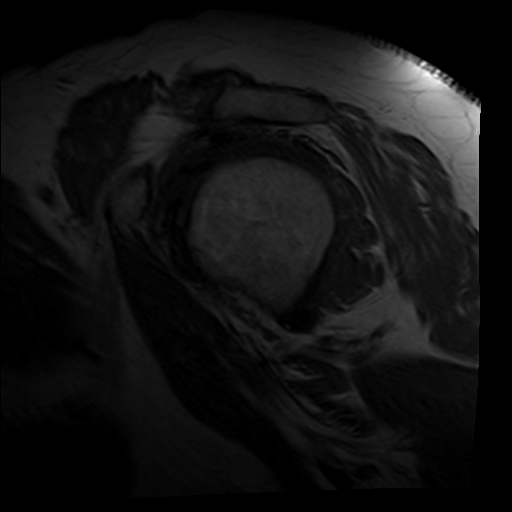

[Series 7: T2 fat-sat · oblique · 4.0mm · 0.66mm/px · 6 of 14 slices shown (2 of 2)]
[im 1/14]
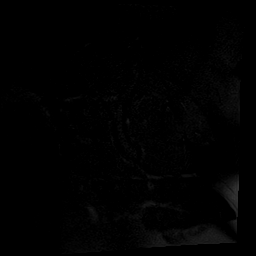
[im 3/14]
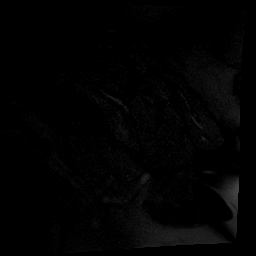
[im 6/14]
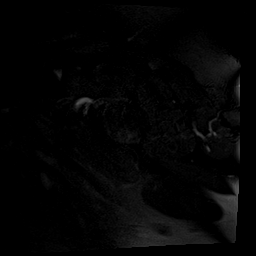
[im 8/14]
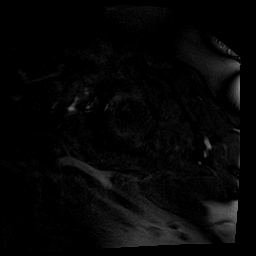
[im 11/14]
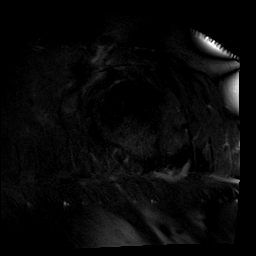
[im 14/14]
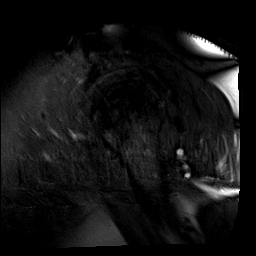

[34 of 40 positions shown; findings below may reference images not displayed]

FINDINGS: Rotator cuff: Intact supraspinatus, infraspinatus, teres minor and
subscapularis tendons.

Muscles: No atrophy or abnormal signal of the muscles of the rotator
cuff.

Biceps long head:  Properly located and intact.

Acromioclavicular Joint: Minimal AC joint arthropathy. Type 2
acromion. No bursitis.

Glenohumeral Joint: No joint effusion. No chondral defect.

Labrum:  Intact.

Bones:  No marrow abnormality, fracture or dislocation.

Other: None
IMPRESSION: Slight AC joint arthropathy. Otherwise, normal MRI of the left
shoulder.

## 2020-11-06 ENCOUNTER — Ambulatory Visit (INDEPENDENT_AMBULATORY_CARE_PROVIDER_SITE_OTHER): Payer: BC Managed Care – PPO | Admitting: Allergy & Immunology

## 2020-11-06 ENCOUNTER — Other Ambulatory Visit: Payer: Self-pay

## 2020-11-06 ENCOUNTER — Encounter: Payer: Self-pay | Admitting: Allergy & Immunology

## 2020-11-06 DIAGNOSIS — B999 Unspecified infectious disease: Secondary | ICD-10-CM | POA: Diagnosis not present

## 2020-11-06 DIAGNOSIS — L2084 Intrinsic (allergic) eczema: Secondary | ICD-10-CM

## 2020-11-06 DIAGNOSIS — J302 Other seasonal allergic rhinitis: Secondary | ICD-10-CM

## 2020-11-06 DIAGNOSIS — J3089 Other allergic rhinitis: Secondary | ICD-10-CM | POA: Diagnosis not present

## 2020-11-06 DIAGNOSIS — J449 Chronic obstructive pulmonary disease, unspecified: Secondary | ICD-10-CM | POA: Diagnosis not present

## 2020-11-06 NOTE — Progress Notes (Signed)
RE: Mark Harrell MRN: [id number] DOB: [birth year] Date of Telemedicine Visit: 11/06/2020  Referring provider: [name], MD Primary care provider: [name], MD  Chief Complaint: Asthma   Telemedicine Follow Up Visit via Telephone: I connected with Mark Harrell for a follow up on 11/06/20 by telephone and verified that I am speaking with the correct person using two identifiers.   I discussed the limitations, risks, security and privacy concerns of performing an evaluation and management service by telephone and the availability of in person appointments. I also discussed with the patient that there may be a patient responsible charge related to this service. The patient expressed understanding and agreed to proceed.  Patient is at home.  Provider is at the office.  Visit start time: 4:39 PM Visit end time: 5:00 PM Insurance consent/check in by: [place] consent and medical assistant/nurse: Mark Harrell  History of Present Illness:  He is a 60 y.o. 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 His previous allergy office visit was in October 2021 with myself.  At that time, he was doing very well.  We will continue with his Trelegy 100 mcg 1 puff once daily in combination with Dymista, cetirizine, and famotidine.  All of this was working very well to control his atopic disease.  We did continue the doxycycline 100 mg twice daily for his specific antibody deficiency.  In the interim, he has done well.  He had to change today's visit to virtual because he did not have someone to watch his mother. His uncle is currently in the rehab center for a fall and fracture. His mother is still bed bound. His mother is doing better lately. She is under hospice care now. She has rallied in the last couple of weeks which is just thought to be one of the lucid moments of her disease process. She  is happy when she sees people and she mostly mumbles.   Asthma/Respiratory Symptom History: He has not been out of the house recently much at all. He remains on the Trelegy one puff once daily. He is using his albuterol infrequently. Dupixent was reapproved and this has helped his asthma as well as his sinus disease. He has not needed steroids.  He has not been into the emergency room or hospitalized since last visit.  Allergic Rhinitis Symptom History: He remains on the nasal spray as needed. Otherwise there is nothing out of the ordinary.  He has not needed antibiotics, aside from has doxycycline twice a day.  He does use nasal saline rinses.  He is on Zyrtec every day.  He did have a second surgery on his neck. He is unsure how many more he is going to have. He is going to try some ablation to treat the pain. He has a history of ankylosing  spondylitis. He also has some left sided paralysis.  He remains on the doxycycline BID. He has not had any breakthrough infections since that time. He has been very fortunate.  He does not remember the last time he had any kind of infection.  He has been staying at home, however.  Otherwise, there have been no changes to his past medical history, surgical history, family history, or social history.  Assessment and Plan:  Mark Harrell is a 60 y.o. male with:  Perennial and seasonal allergic rhinitis (sweet vernal grass, box elder, cat, weeds, ragweed, molds, cockroach, dust mite)  Recurrent infections - doing well on prophylactic doxycycline today   Asthma-COPD overlap syndrome - on Trelegy, but wanting to restart Dupixent (AEC 300)   Hymenoptera allergy - EpiPen up to date   GERD - on PRN H2 blocker    Obesity - with resulting nerve impingement and joint pain  Chronic back pain - s/p recent neck surgery   Migraines - on botulinum injections   Polypharmacy   Mark Harrell is doing very well on his current regimen.  We are not going to change anything  because he has not had any systemic steroids and has not had any breakthrough infections.  He is fine with remaining on all of his asthma and allergy medications.  He has had no adverse reactions to his prophylactic antibiotics.  He had discussed doing immunoglobulin replacement therapy initially, but thankfully the antibiotic prophylaxis has helped. We will see him again in 3 to 4 months in the office so that we can obtain a spirometry.  He does not think he needs refills now, but he will contact us if needed.  Diagnostics: None.  Medication List:  Current Outpatient Medications  Medication Sig Dispense Refill   acetaminophen (TYLENOL) 500 MG tablet Take 1,000 mg by mouth every 6 (six) hours as needed for moderate pain.     albuterol (VENTOLIN HFA) 108 (90 Base) MCG/ACT inhaler 1 TO 2 INHALATIONS 10 TO 15 MINUTES APART EVERY 4 HRS TO RESCUE ASTHMA 6.7 each 1   anastrozole (ARIMIDEX) 1 MG tablet Take 1 mg by mouth daily.     ascorbic acid (VITAMIN C) 1000 MG tablet Take 1,000 mg by mouth every evening.     azelastine (ASTELIN) 0.1 % nasal spray Use 1 to 2 sprays  Each Nostril 2 x /day (Patient taking differently: Place 2 sprays into both nostrils 2 (two) times daily.) 90 mL 3   bisoprolol-hydrochlorothiazide (ZIAC) 5-6.25 MG tablet TAKE 1 TABLET DAILY FOR BLOOD PRESSURE (Patient taking differently: Take 1 tablet by mouth daily. FOR BLOOD PRESSURE) 90 tablet 3   Botulinum Toxin Type A 200 units SOLR Inject 200 Units into the skin every 3 (three) months.     celecoxib (CELEBREX) 200 MG capsule Take 1 capsule (200 mg total) by mouth every 12 (twelve) hours. 60 capsule 2   cetaphil (CETAPHIL) lotion Apply topically 2 (two) times daily. (Patient taking differently: Apply 1 application topically 2 (two) times daily.) 236 mL 0   cetirizine (ZYRTEC) 10 MG tablet Take 10 mg by mouth daily.     Cholecalciferol (VITAMIN D3) 125 MCG (5000 UT) TABS Take 5 tablets (25,000 Units total) by mouth See admin  instructions. Take 15000 in the morning and 10,000 in the evening     Cinnamon 500 MG capsule Take 1,000 mg by mouth 3 (three) times daily.     clotrimazole-betamethasone (LOTRISONE) cream APPLY TO AFFECTED AREA TWICE A DAY (Patient taking differently: Apply 1 application topically 2 (two) times daily as needed (rash).) 15 g 2   Coenzyme Q10 (CO Q 10 PO) Take 600 mg by mouth daily.     cyclobenzaprine (FLEXERIL) 10 MG tablet Take 10 mg by mouth 3 (three) times daily.     diclofenac Sodium (VOLTAREN) 1 % GEL APPLY 2 TO 4 GRAMS TOPICALLY 2 TO 4 TIMES DAILY FOR PAIN & INFLAMMATION( OTC NOT COVERED) 300 g 3   donepezil (ARICEPT) 23 MG TABS tablet Take 23 mg by mouth daily.     doxycycline (VIBRAMYCIN) 100 MG capsule TAKE 1 CAPSULE  BY MOUTH TWICE A DAY 60 capsule 4   dupilumab (DUPIXENT) 300 MG/2ML prefilled syringe Inject 300 mg into the skin once a week.     Eluxadoline 100 MG TABS Take 100 mg by mouth 2 (two) times daily.      EPINEPHrine 0.3 mg/0.3 mL IJ SOAJ injection Inject 0.3 mg into the muscle as needed. (Patient taking differently: Inject 0.3 mg into the muscle as needed for anaphylaxis.) 2 each 2   fluticasone (FLONASE) 50 MCG/ACT nasal spray Use 1 to 2 sprays each Nostril 2 x /day (Patient taking differently: Place 2 sprays into both nostrils 2 (two) times daily.) 48 g 3   Fluticasone-Umeclidin-Vilant (TRELEGY ELLIPTA) 100-62.5-25 MCG/INH AEPB Inhale 1 puff into the lungs daily. (Patient taking differently: Inhale 1 puff into the lungs in the morning and at bedtime.) 28 each 2   Guaifenesin 1200 MG TB12 Take 1,200 mg by mouth 2 (two) times daily.     ipratropium-albuterol (DUONEB) 0.5-2.5 (3) MG/3ML SOLN TAKE 3 MLS BY NEBULIZATION EVERY 4 (FOUR) HOURS AS NEEDED. 270 mL 1   Krill Oil 500 MG CAPS Take 500 mg by mouth at bedtime.     L-METHYLFOLATE-B6-B12 PO Take 1 tablet by mouth daily.     Magnesium 500 MG CAPS Take 500 mg by mouth daily with supper.     memantine (NAMENDA) 10 MG tablet Take  10 mg by mouth 2 (two) times daily.     metFORMIN (GLUCOPHAGE-XR) 500 MG 24 hr tablet Takes 2 tablets 2 x /day with Breakfast & Supper  for Diabetes (Patient taking differently: Take 1,000 mg by mouth 2 (two) times daily. for Diabetes) 360 tablet 3   Misc Natural Products (GLUCOS-CHONDROIT-MSM COMPLEX) TABS Take 2 tablets by mouth 3 (three) times daily.     montelukast (SINGULAIR) 10 MG tablet Take 1 tablet Daily for Allergies (Patient taking differently: Take 10 mg by mouth at bedtime.) 90 tablet 3   Multiple Minerals-Vitamins (CITRACAL MAXIMUM PLUS) TABS Take 1 tablet by mouth 2 (two) times daily. Vitamin D     Multiple Vitamins-Minerals (MULTIVITAMIN WITH MINERALS) tablet Take 1 tablet by mouth daily.     Multiple Vitamins-Minerals (PRESERVISION AREDS 2) CAPS Take 1 capsule by mouth 2 (two) times daily.     naloxone (NARCAN) nasal spray 4 mg/0.1 mL Place 1 spray into the nose as needed (opioid overdose).      ondansetron (ZOFRAN-ODT) 4 MG disintegrating tablet TAKE 1 TABLET (4 MG TOTAL) BY MOUTH EVERY 6 (SIX) HOURS AS NEEDED FOR NAUSEA OR VOMITING. 30 tablet 0   oxymetazoline (AFRIN) 0.05 % nasal spray Place 1 spray into both nostrils at bedtime.     pentosan polysulfate (ELMIRON) 100 MG capsule Take 100 mg by mouth 2 (two) times daily.     potassium chloride SA (KLOR-CON M20) 20 MEQ tablet TAKE 1 TABLET TWICE DAILY FOR POTASSIUM (Patient taking differently: Take 20 mEq by mouth 2 (two) times daily. FOR POTASSIUM) 180 tablet 3   pravastatin (PRAVACHOL) 40 MG tablet Take 1 tablet at Bedtime for Cholesterol (Patient taking differently: Take 40 mg by mouth at bedtime.) 90 tablet 3   pregabalin (LYRICA) 150 MG capsule TAKE 1 CAPSULE 3 TIMES A DAY (Patient taking differently: Take 150 mg by mouth 3 (three) times daily.) 270 capsule 1   PROBIOTIC PRODUCT PO Take 1 capsule by mouth 2 (two) times daily. VSL     promethazine-dextromethorphan (PROMETHAZINE-DM) 6.25-15 MG/5ML syrup TAKE 5 MLS BY MOUTH 4  (FOUR) TIMES DAILY AS  NEEDED FOR COUGH. 240 mL 1   sertraline (ZOLOFT) 50 MG tablet Take 50 mg by mouth 2 (two) times daily.      sodium chloride (OCEAN) 0.65 % SOLN nasal spray Place 1 spray into both nostrils 4 (four) times daily as needed for congestion. Uses each time before other nasal sprays     tadalafil (CIALIS) 5 MG tablet Take 5 mg by mouth daily.      traMADol (ULTRAM) 50 MG tablet Take 1 tablet (50 mg total) by mouth every 6 (six) hours as needed. (Patient taking differently: Take 50 mg by mouth every 6 (six) hours as needed for moderate pain or severe pain.) 20 tablet 0   trospium (SANCTURA) 20 MG tablet Take 20 mg by mouth 2 (two) times daily. In the evening and at bedtime     TURMERIC PO Take 1,160 mg by mouth 3 (three) times daily.     zinc gluconate 50 MG tablet Take 50 mg by mouth 3 (three) times daily.     furosemide (LASIX) 80 MG tablet TAKE 1 TO 1 & 1/2 TABLETS 2 X /DAY FOR FLUID RETENTION & SWELLING OF LEGS (Patient not taking: Reported on 11/06/2020) 270 tablet 1   No current facility-administered medications for this visit.   Allergies: Allergies  Allergen Reactions   Bee Venom Swelling   Duloxetine Shortness Of Breath    Brought on asthma   Ppd [Tuberculin Purified Protein Derivative] Other (See Comments)    +ppd NEG Quantferron Gold 3/13 (shows false positive)    Fenofibrate Other (See Comments)    Back pain   Verapamil Other (See Comments)    Back pain   Claritin [Loratadine] Other (See Comments)    UNKNOWN    Levofloxacin Diarrhea   Other Diarrhea    "Some antibiotics" cause diarrhea   I reviewed his past medical history, social history, family history, and environmental history and no significant changes have been reported from previous visits.  Review of Systems  Constitutional: Negative.  Negative for chills, diaphoresis, fatigue and fever.  HENT: Negative.  Negative for congestion, drooling, ear discharge and ear pain.   Eyes:  Negative for pain,  discharge and redness.  Respiratory:  Negative for cough, shortness of breath, wheezing and stridor.   Cardiovascular: Negative.  Negative for chest pain and palpitations.  Gastrointestinal:  Negative for abdominal pain.  Musculoskeletal:  Positive for back pain and neck pain.  Skin: Negative.  Negative for rash.  Allergic/Immunologic: Negative for environmental allergies.  Neurological:  Negative for dizziness and headaches.  Hematological:  Does not bruise/bleed easily.   Objective:  Physical exam not obtained as encounter was done via telephone.   Previous notes and tests were reviewed.  I discussed the assessment and treatment plan with the patient. The patient was provided an opportunity to ask questions and all were answered. The patient agreed with the plan and demonstrated an understanding of the instructions.   The patient was advised to call back or seek an in-person evaluation if the symptoms worsen or if the condition fails to improve as anticipated.  I provided 21 minutes of non-face-to-face time during this encounter.  It was my pleasure to participate in [place] care today. Please feel free to contact me with any questions or concerns.   Sincerely,  [name], MD

## 2020-11-08 ENCOUNTER — Encounter: Payer: Self-pay | Admitting: Allergy & Immunology

## 2020-11-14 DIAGNOSIS — J9611 Chronic respiratory failure with hypoxia: Secondary | ICD-10-CM | POA: Diagnosis not present

## 2020-11-14 DIAGNOSIS — J449 Chronic obstructive pulmonary disease, unspecified: Secondary | ICD-10-CM | POA: Diagnosis not present

## 2020-11-21 DIAGNOSIS — G43709 Chronic migraine without aura, not intractable, without status migrainosus: Secondary | ICD-10-CM | POA: Diagnosis not present

## 2020-11-29 DIAGNOSIS — G43719 Chronic migraine without aura, intractable, without status migrainosus: Secondary | ICD-10-CM | POA: Diagnosis not present

## 2020-11-30 ENCOUNTER — Other Ambulatory Visit: Payer: Self-pay | Admitting: Specialist

## 2020-12-14 DIAGNOSIS — J9611 Chronic respiratory failure with hypoxia: Secondary | ICD-10-CM | POA: Diagnosis not present

## 2020-12-14 DIAGNOSIS — J449 Chronic obstructive pulmonary disease, unspecified: Secondary | ICD-10-CM | POA: Diagnosis not present

## 2020-12-17 DIAGNOSIS — N401 Enlarged prostate with lower urinary tract symptoms: Secondary | ICD-10-CM | POA: Diagnosis not present

## 2020-12-17 DIAGNOSIS — E291 Testicular hypofunction: Secondary | ICD-10-CM | POA: Diagnosis not present

## 2020-12-17 DIAGNOSIS — R350 Frequency of micturition: Secondary | ICD-10-CM | POA: Diagnosis not present

## 2021-01-09 ENCOUNTER — Encounter: Payer: Self-pay | Admitting: Internal Medicine

## 2021-01-09 ENCOUNTER — Ambulatory Visit (INDEPENDENT_AMBULATORY_CARE_PROVIDER_SITE_OTHER): Payer: BC Managed Care – PPO | Admitting: Internal Medicine

## 2021-01-09 ENCOUNTER — Other Ambulatory Visit: Payer: Self-pay | Admitting: Allergy & Immunology

## 2021-01-09 ENCOUNTER — Other Ambulatory Visit: Payer: Self-pay

## 2021-01-09 VITALS — BP 128/88 | HR 71 | Temp 97.9°F | Resp 18 | Ht 72.0 in | Wt 356.0 lb

## 2021-01-09 DIAGNOSIS — Z0000 Encounter for general adult medical examination without abnormal findings: Secondary | ICD-10-CM | POA: Diagnosis not present

## 2021-01-09 DIAGNOSIS — Z136 Encounter for screening for cardiovascular disorders: Secondary | ICD-10-CM

## 2021-01-09 DIAGNOSIS — G4733 Obstructive sleep apnea (adult) (pediatric): Secondary | ICD-10-CM

## 2021-01-09 DIAGNOSIS — N138 Other obstructive and reflux uropathy: Secondary | ICD-10-CM

## 2021-01-09 DIAGNOSIS — Z1329 Encounter for screening for other suspected endocrine disorder: Secondary | ICD-10-CM

## 2021-01-09 DIAGNOSIS — Z131 Encounter for screening for diabetes mellitus: Secondary | ICD-10-CM | POA: Diagnosis not present

## 2021-01-09 DIAGNOSIS — R350 Frequency of micturition: Secondary | ICD-10-CM | POA: Diagnosis not present

## 2021-01-09 DIAGNOSIS — Z6841 Body Mass Index (BMI) 40.0 and over, adult: Secondary | ICD-10-CM

## 2021-01-09 DIAGNOSIS — N401 Enlarged prostate with lower urinary tract symptoms: Secondary | ICD-10-CM

## 2021-01-09 DIAGNOSIS — I10 Essential (primary) hypertension: Secondary | ICD-10-CM

## 2021-01-09 DIAGNOSIS — N181 Chronic kidney disease, stage 1: Secondary | ICD-10-CM

## 2021-01-09 DIAGNOSIS — Z1389 Encounter for screening for other disorder: Secondary | ICD-10-CM | POA: Diagnosis not present

## 2021-01-09 DIAGNOSIS — Z8249 Family history of ischemic heart disease and other diseases of the circulatory system: Secondary | ICD-10-CM

## 2021-01-09 DIAGNOSIS — Z125 Encounter for screening for malignant neoplasm of prostate: Secondary | ICD-10-CM | POA: Diagnosis not present

## 2021-01-09 DIAGNOSIS — Z130 Encounter for screening for diseases of the blood and blood-forming organs and certain disorders involving the immune mechanism: Secondary | ICD-10-CM | POA: Diagnosis not present

## 2021-01-09 DIAGNOSIS — E1169 Type 2 diabetes mellitus with other specified complication: Secondary | ICD-10-CM

## 2021-01-09 DIAGNOSIS — E1122 Type 2 diabetes mellitus with diabetic chronic kidney disease: Secondary | ICD-10-CM

## 2021-01-09 DIAGNOSIS — Z13220 Encounter for screening for lipoid disorders: Secondary | ICD-10-CM

## 2021-01-09 DIAGNOSIS — Z1211 Encounter for screening for malignant neoplasm of colon: Secondary | ICD-10-CM

## 2021-01-09 DIAGNOSIS — E559 Vitamin D deficiency, unspecified: Secondary | ICD-10-CM | POA: Diagnosis not present

## 2021-01-09 DIAGNOSIS — Z111 Encounter for screening for respiratory tuberculosis: Secondary | ICD-10-CM

## 2021-01-09 DIAGNOSIS — Z79899 Other long term (current) drug therapy: Secondary | ICD-10-CM

## 2021-01-09 DIAGNOSIS — Z0001 Encounter for general adult medical examination with abnormal findings: Secondary | ICD-10-CM

## 2021-01-09 DIAGNOSIS — R5383 Other fatigue: Secondary | ICD-10-CM
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
## 2021-01-09 NOTE — Patient Instructions (Signed)
Due to recent changes in healthcare laws, you may see the results of your imaging and laboratory studies on MyChart before your provider has had a chance to review them.  We understand that in some cases there may be results that are confusing or concerning to you. Not all laboratory results come back in the same time frame and the provider may be waiting for multiple results in order to interpret others.  Please give us 48 hours in order for your provider to thoroughly review all the results before contacting the office for clarification of your results.  ° °++++++++++++++++++++++++++++++++++++++ ° Vit D  & °Vit C 1,000 mg   °are recommended to help protect  °against the Covid-19 and other Corona viruses.  ° ° Also it's recommended  °to take  °Zinc 50 mg  °to help  °protect against the Covid-19   °and best place to get ° is also on [employer]  °and don't pay more than 6-8 cents /pill !  °=============================== °Coronavirus (COVID-19) Are you at risk? ° °Are you at risk for the Coronavirus (COVID-19)? ° °To be considered HIGH RISK for Coronavirus (COVID-19), you have to meet the following criteria: ° °Traveled to [place], [place], [place], [place] or [place]; or in the [place] to [place], [place], [place]  °or [place]; and have fever, cough, and shortness of breath within the last 2 weeks of travel OR °Been in close contact with a person diagnosed with COVID-19 within the last 2 weeks and have  °fever, cough,and shortness of breath ° °IF YOU DO NOT MEET THESE CRITERIA, YOU ARE CONSIDERED LOW RISK FOR COVID-19. ° °What to do if you are HIGH RISK for COVID-19? ° °If you are having a medical emergency, call 911. °Seek medical care right away. Before you go to a doctor’s office, urgent care or emergency department, ° call ahead and tell them about your recent travel, contact with someone diagnosed with COVID-19  ° and your symptoms.  °You should receive instructions from your physician’s office  regarding next steps of care.  °When you arrive at healthcare provider, tell the healthcare staff immediately you have returned from  °visiting [place], [place], [place], [place] or [place]; or traveled in the [place] to [place], [place],  °[place] or [place] in the last two weeks or you have been in close contact with a person diagnosed with  °COVID-19 in the last 2 weeks.   °Tell the health care staff about your symptoms: fever, cough and shortness of breath. °After you have been seen by a medical provider, you will be either: °Tested for (COVID-19) and discharged home on quarantine except to seek medical care if  °symptoms worsen, and asked to  °Stay home and avoid contact with others until you get your results (4-5 days)  °Avoid travel on public transportation if possible (such as bus, train, or airplane) or °Sent to the Emergency Department by EMS for evaluation, COVID-19 testing  and  °possible admission depending on your condition and test results. ° °What to do if you are LOW RISK for COVID-19? ° °Reduce your risk of any infection by using the same precautions used for avoiding the common cold or flu:  °Wash your hands often with soap and warm water for at least 20 seconds.  If soap and water are not readily available,  °use an alcohol-based hand sanitizer with at least 60% alcohol.  °If coughing or sneezing, cover your mouth and nose by coughing   or sneezing into the elbow areas of your shirt or coat, ° into a tissue or into your sleeve (not your hands). °Avoid shaking hands with others and consider head nods or verbal greetings only. °Avoid touching your eyes, nose, or mouth with unwashed hands.  °Avoid close contact with people who are sick. °Avoid places or events with large numbers of people in one location, like concerts or sporting events. °Carefully consider travel plans you have or are making. °If you are planning any travel outside or inside the [place], visit the CDC’s Travelers’ Health  webpage for the latest health notices. °If you have some symptoms but not all symptoms, continue to monitor at home and seek medical attention  °if your symptoms worsen. °If you are having a medical emergency, call 911. °>>>>>>>>>>>>>>>>>>>>>>>>>>>> °Preventive Care for Adults ° °A healthy lifestyle and preventive care can promote health and wellness. Preventive health guidelines for men include the following key practices: °A routine yearly physical is a good way to check with your health care provider about your health and preventative screening. It is a chance to share any concerns and updates on your health and to receive a thorough exam. °Visit your dentist for a routine exam and preventative care every 6 months. Brush your teeth twice a day and floss once a day. Good oral hygiene prevents tooth decay and gum disease. °The frequency of eye exams is based on your age, health, family medical history, use of contact lenses, and other factors. Follow your health care provider's recommendations for frequency of eye exams. °Eat a healthy diet. Foods such as vegetables, fruits, whole grains, low-fat dairy products, and lean protein foods contain the nutrients you need without too many calories. Decrease your intake of foods high in solid fats, added sugars, and salt. Eat the right amount of calories for you. Get information about a proper diet from your health care provider, if necessary. °Regular physical exercise is one of the most important things you can do for your health. Most adults should get at least 150 minutes of moderate-intensity exercise (any activity that increases your heart rate and causes you to sweat) each week. In addition, most adults need muscle-strengthening exercises on 2 or more days a week. °Maintain a healthy weight. The body mass index (BMI) is a screening tool to identify possible weight problems. It provides an estimate of body fat based on height and weight. Your health care provider can  find your BMI and can help you achieve or maintain a healthy weight. For adults 20 years and older: °A BMI below 18.5 is considered underweight. °A BMI of 18.5 to 24.9 is normal. °A BMI of 25 to 29.9 is considered overweight. °A BMI of 30 and above is considered obese. °Maintain normal blood lipids and cholesterol levels by exercising and minimizing your intake of saturated fat. Eat a balanced diet with plenty of fruit and vegetables. Blood tests for lipids and cholesterol should begin at age 20 and be repeated every 5 years. If your lipid or cholesterol levels are high, you are over 50, or you are at high risk for heart disease, you may need your cholesterol levels checked more frequently. Ongoing high lipid and cholesterol levels should be treated with medicines if diet and exercise are not working. °If you smoke, find out from your health care provider how to quit. If you do not use tobacco, do not start. °Lung cancer screening is recommended for adults aged 55-80 years who are at high risk for   developing lung cancer because of a history of smoking. A yearly low-dose CT scan of the lungs is recommended for people who have at least a 30-pack-year history of smoking and are a current smoker or have quit within the past 15 years. A pack year of smoking is smoking an average of 1 pack of cigarettes a day for 1 year (for example: 1 pack a day for 30 years or 2 packs a day for 15 years). Yearly screening should continue until the smoker has stopped smoking for at least 15 years. Yearly screening should be stopped for people who develop a health problem that would prevent them from having lung cancer treatment. °If you choose to drink alcohol, do not have more than 2 drinks per day. One drink is considered to be 12 ounces (355 mL) of beer, 5 ounces (148 mL) of wine, or 1.5 ounces (44 mL) of liquor. °Avoid use of street drugs. Do not share needles with anyone. Ask for help if you need support or instructions about  stopping the use of drugs. °High blood pressure causes heart disease and increases the risk of stroke. Your blood pressure should be checked at least every 1-2 years. Ongoing high blood pressure should be treated with medicines, if weight loss and exercise are not effective. °If you are 45-79 years old, ask your health care provider if you should take aspirin to prevent heart disease. °Diabetes screening involves taking a blood sample to check your fasting blood sugar level. This should be done once every 3 years, after age 45, if you are within normal weight and without risk factors for diabetes. Testing should be considered at a younger age or be carried out more frequently if you are overweight and have at least 1 risk factor for diabetes. °Colorectal cancer can be detected and often prevented. Most routine colorectal cancer screening begins at the age of 50 and continues through age 75. However, your health care provider may recommend screening at an earlier age if you have risk factors for colon cancer. On a yearly basis, your health care provider may provide home test kits to check for hidden blood in the stool. Use of a small camera at the end of a tube to directly examine the colon (sigmoidoscopy or colonoscopy) can detect the earliest forms of colorectal cancer. Talk to your health care provider about this at age 50, when routine screening begins. Direct exam of the colon should be repeated every 5-10 years through age 75, unless early forms of precancerous polyps or small growths are found. ° Talk with your health care provider about prostate cancer screening. °Testicular cancer screening isrecommended for adult males. Screening includes self-exam, a health care provider exam, and other screening tests. Consult with your health care provider about any symptoms you have or any concerns you have about testicular cancer. °Use sunscreen. Apply sunscreen liberally and repeatedly throughout the day. You should  seek shade when your shadow is shorter than you. Protect yourself by wearing long sleeves, pants, a wide-brimmed hat, and sunglasses year round, whenever you are outdoors. °Once a month, do a whole-body skin exam, using a mirror to look at the skin on your back. Tell your health care provider about new moles, moles that have irregular borders, moles that are larger than a pencil eraser, or moles that have changed in shape or color. °Stay current with required vaccines (immunizations). °Influenza vaccine. All adults should be immunized every year. °Tetanus, diphtheria, and acellular pertussis (Td, Tdap) vaccine. An   adult who has not previously received Tdap or who does not know his vaccine status should receive 1 dose of Tdap. This initial dose should be followed by tetanus and diphtheria toxoids (Td) booster doses every 10 years. Adults with an unknown or incomplete history of completing a 3-dose immunization series with Td-containing vaccines should begin or complete a primary immunization series including a Tdap dose. Adults should receive a Td booster every 10 years. °Varicella vaccine. An adult without evidence of immunity to varicella should receive 2 doses or a second dose if he has previously received 1 dose. °Human papillomavirus (HPV) vaccine. Males aged 13-21 years who have not received the vaccine previously should receive the 3-dose series. Males aged 22-26 years may be immunized. Immunization is recommended through the age of 26 years for any male who has sex with males and did not get any or all doses earlier. Immunization is recommended for any person with an immunocompromised condition through the age of 26 years if he did not get any or all doses earlier. During the 3-dose series, the second dose should be obtained 4-8 weeks after the first dose. The third dose should be obtained 24 weeks after the first dose and 16 weeks after the second dose. °Zoster vaccine. One dose is recommended for adults  aged 60 years or older unless certain conditions are present. ° °PREVNAR  - Pneumococcal 13-valent conjugate (PCV13) vaccine. When indicated, a person who is uncertain of his immunization history and has no record of immunization should receive the PCV13 vaccine. An adult aged 19 years or older who has certain medical conditions and has not been previously immunized should receive 1 dose of PCV13 vaccine. This PCV13 should be followed with a dose of pneumococcal polysaccharide (PPSV23) vaccine. The PPSV23 vaccine dose should be obtained at least 1 r more year(s) after the dose of PCV13 vaccine. An adult aged 19 years or older who has certain medical conditions and previously received 1 or more doses of PPSV23 vaccine should receive 1 dose of PCV13. The PCV13 vaccine dose should be obtained 1 or more years after the last PPSV23 vaccine dose. ° °PNEUMOVAX - Pneumococcal polysaccharide (PPSV23) vaccine. When PCV13 is also indicated, PCV13 should be obtained first. All adults aged 65 years and older should be immunized. An adult younger than age 65 years who has certain medical conditions should be immunized. Any person who resides in a nursing home or long-term care facility should be immunized. An adult smoker should be immunized. People with an immunocompromised condition and certain other conditions should receive both PCV13 and PPSV23 vaccines. People with human immunodeficiency virus (HIV) infection should be immunized as soon as possible after diagnosis. Immunization during chemotherapy or radiation therapy should be avoided. Routine use of PPSV23 vaccine is not recommended for American Indians, [place] Natives, or people younger than 65 years unless there are medical conditions that require PPSV23 vaccine. When indicated, people who have unknown immunization and have no record of immunization should receive PPSV23 vaccine. One-time revaccination 5 years after the first dose of PPSV23 is recommended for people  aged 19-64 years who have chronic kidney failure, nephrotic syndrome, asplenia, or immunocompromised conditions. People who received 1-2 doses of PPSV23 before age 65 years should receive another dose of PPSV23 vaccine at age 65 years or later if at least 5 years have passed since the previous dose. Doses of PPSV23 are not needed for people immunized with PPSV23 at or after age 65 years. ° °Hepatitis A vaccine.   Adults who wish to be protected from this disease, have certain high-risk conditions, work with hepatitis A-infected animals, work in hepatitis A research labs, or travel to or work in countries with a high rate of hepatitis A should be immunized. Adults who were previously unvaccinated and who anticipate close contact with an international adoptee during the first 60 days after arrival in the [place] from a country with a high rate of hepatitis A should be immunized. ° °Hepatitis B vaccine. Adults should be immunized if they wish to be protected from this disease, have certain high-risk conditions, may be exposed to blood or other infectious body fluids, are household contacts or sex partners of hepatitis B positive people, are clients or workers in certain care facilities, or travel to or work in countries with a high rate of hepatitis B. ° °Preventive Service / Frequency ° °Ages 40 to 64 °Blood pressure check. °Lipid and cholesterol check °Lung cancer screening. / Every year if you are aged 55-80 years and have a 30-pack-year history of smoking and currently smoke or have quit within the past 15 years. Yearly screening is stopped once you have quit smoking for at least 15 years or develop a health problem that would prevent you from having lung cancer treatment. °Fecal occult blood test (FOBT) of stool. / Every year beginning at age 50 and continuing until age 75. You may not have to do this test if you get a colonoscopy every 10 years. °Flexible sigmoidoscopy** or colonoscopy.** / Every 5 years for  a flexible sigmoidoscopy or every 10 years for a colonoscopy beginning at age 50 and continuing until age 75. °Screening for abdominal aortic aneurysm (AAA)  by ultrasound is recommended for people who have history of high blood pressure or who are current or former smokers. °+++++++++++ °Recommend Adult Low Dose Aspirin or  °coated  Aspirin 81 mg daily  °To reduce risk of Colon Cancer 40 %, ° Skin Cancer 26 % ,  °Malignant Melanoma 46% ° and  °Pancreatic cancer 60% °++++++++++++++++++++ °Vitamin D goal ° is between 70-100.  °Please make sure that you are taking your Vitamin D as directed.  °It is very important as a natural anti-inflammatory  °helping hair, skin, and nails, as well as reducing stroke and heart attack risk.  °It helps your bones and helps with mood. °It also decreases numerous cancer risks so please take it as directed.  °Low Vit D is associated with a 200-300% higher risk for CANCER  °and 200-300% higher risk for HEART   ATTACK  &  STROKE.   °...................................... °It is also associated with higher death rate at younger ages,  °autoimmune diseases like Rheumatoid arthritis, Lupus, Multiple Sclerosis.    °Also many other serious conditions, like depression, Alzheimer's °Dementia, infertility, muscle aches, fatigue, fibromyalgia - just to name a few. °+++++++++++++++++++++ °Recommend the book "The END of DIETING" by Dr Joel Fuhrman  °& the book "The END of DIABETES " by Dr Joel Fuhrman °At [employer] - get book & Audio CD's  °  Being diabetic has a  300% increased risk for heart attack, stroke, cancer, and alzheimer- type vascular dementia. It is very important that you work harder with diet by avoiding all foods that are white. Avoid white rice (brown & wild rice is OK), white potatoes (sweetpotatoes in moderation is OK), White bread or wheat bread or anything made out of white flour like bagels, donuts, rolls, buns, biscuits, cakes, pastries, cookies, pizza crust, and pasta (made    from white flour & egg whites) - vegetarian pasta or spinach or wheat pasta is OK. Multigrain breads like Arnold's or [employer], or multigrain sandwich thins or flatbreads.  Diet, exercise and weight loss can reverse and cure diabetes in the early stages.  Diet, exercise and weight loss is very important in the control and prevention of complications of diabetes which affects every system in your body, ie. Brain - dementia/stroke, eyes - glaucoma/blindness, heart - heart attack/heart failure, kidneys - dialysis, stomach - gastric paralysis, intestines - malabsorption, nerves - severe painful neuritis, circulation - gangrene & loss of a leg(s), and finally cancer and Alzheimers. ° °  I recommend avoid fried & greasy foods,  sweets/candy, white rice (brown or wild rice or Quinoa is OK), white potatoes (sweet potatoes are OK) - anything made from white flour - bagels, doughnuts, rolls, buns, biscuits,white and wheat breads, pizza crust and traditional pasta made of white flour & egg white(vegetarian pasta or spinach or wheat pasta is OK).  Multi-grain bread is OK - like multi-grain flat bread or sandwich thins. Avoid alcohol in excess. Exercise is also important. ° °  Eat all the vegetables you want - avoid meat, especially red meat and dairy - especially cheese.  Cheese is the most concentrated form of trans-fats which is the worst thing to clog up our arteries. Veggie cheese is OK which can be found in the fresh produce section at [employer] or [employer] or [employer] ° °++++++++++++++++++++++ °DASH Eating Plan ° °DASH stands for "Dietary Approaches to Stop Hypertension."  ° °The DASH eating plan is a healthy eating plan that has been shown to reduce high blood pressure (hypertension). Additional health benefits may include reducing the risk of type 2 diabetes mellitus, heart disease, and stroke. The DASH eating plan may also help with weight loss. °WHAT DO I NEED TO KNOW ABOUT THE DASH EATING PLAN? °For  the DASH eating plan, you will follow these general guidelines: °Choose foods with a percent daily value for sodium of less than 5% (as listed on the food label). °Use salt-free seasonings or herbs instead of table salt or sea salt. °Check with your health care provider or pharmacist before using salt substitutes. °Eat lower-sodium products, often labeled as "lower sodium" or "no salt added." °Eat fresh foods. °Eat more vegetables, fruits, and low-fat dairy products. °Choose whole grains. Look for the word "whole" as the first word in the ingredient list. °Choose fish  °Limit sweets, desserts, sugars, and sugary drinks. °Choose heart-healthy fats. °Eat veggie cheese  °Eat more home-cooked food and less restaurant, buffet, and fast food. °Limit fried foods. °Cook foods using methods other than frying. °Limit canned vegetables. If you do use them, rinse them well to decrease the sodium. °When eating at a restaurant, ask that your food be prepared with less salt, or no salt if possible. °                  °   WHAT FOODS CAN I EAT? °Read Dr Joel Fuhrman's books on The End of Dieting & The End of Diabetes ° °Grains °Whole grain or whole wheat bread. Brown rice. Whole grain or whole wheat pasta. Quinoa, bulgur, and whole grain cereals. Low-sodium cereals. Corn or whole wheat flour tortillas. Whole grain cornbread. Whole grain crackers. Low-sodium crackers. ° °Vegetables °Fresh or frozen vegetables (raw, steamed, roasted, or grilled). Low-sodium or reduced-sodium tomato and vegetable juices. Low-sodium or reduced-sodium tomato sauce and paste. Low-sodium or reduced-sodium canned vegetables.  ° °  Fruits °All fresh, canned (in natural juice), or frozen fruits. ° °Protein Products ° All fish and seafood.  Dried beans, peas, or lentils. Unsalted nuts and seeds. Unsalted canned beans. ° °Dairy °Low-fat dairy products, such as skim or 1% milk, 2% or reduced-fat cheeses, low-fat ricotta or cottage cheese, or plain low-fat yogurt.  Low-sodium or reduced-sodium cheeses. ° °Fats and Oils °Tub margarines without trans fats. Light or reduced-fat mayonnaise and salad dressings (reduced sodium). Avocado. Safflower, olive, or canola oils. Natural peanut or almond butter. ° °Other °Unsalted popcorn and pretzels. °The items listed above may not be a complete list of recommended foods or beverages. Contact your dietitian for more options. ° °+++++++++++++++++++ ° °WHAT FOODS ARE NOT RECOMMENDED? °Grains/ White flour or wheat flour °White bread. White pasta. White rice. Refined cornbread. Bagels and croissants. Crackers that contain trans fat. ° °Vegetables ° °Creamed or fried vegetables. Vegetables in a . Regular canned vegetables. Regular canned tomato sauce and paste. Regular tomato and vegetable juices. ° °Fruits °Dried fruits. Canned fruit in light or heavy syrup. Fruit juice. ° °Meat and Other Protein Products °Meat in general - RED meat & White meat.  Fatty cuts of meat. Ribs, chicken wings, all processed meats as bacon, sausage, bologna, salami, fatback, hot dogs, bratwurst and packaged luncheon meats. ° °Dairy °Whole or 2% milk, cream, half-and-half, and cream cheese. Whole-fat or sweetened yogurt. Full-fat cheeses or blue cheese. Non-dairy creamers and whipped toppings. Processed cheese, cheese spreads, or cheese curds. ° °Condiments °Onion and garlic salt, seasoned salt, table salt, and sea salt. Canned and packaged gravies. Worcestershire sauce. Tartar sauce. Barbecue sauce. Teriyaki sauce. Soy sauce, including reduced sodium. Steak sauce. Fish sauce. Oyster sauce. Cocktail sauce. Horseradish. Ketchup and mustard. Meat flavorings and tenderizers. Bouillon cubes. Hot sauce. Tabasco sauce. Marinades. Taco seasonings. Relishes. ° °Fats and Oils °Butter, stick margarine, lard, shortening and bacon fat. Coconut, palm kernel, or palm oils. Regular salad dressings. ° °Pickles and olives. Salted popcorn and pretzels. ° °The items listed above may not  be a complete list of foods and beverages to avoid. ° ° °

## 2021-01-10 LAB — CBC WITH DIFFERENTIAL/PLATELET
Absolute Monocytes: 928 {cells}/uL (ref 200–950)
Basophils Absolute: 100 {cells}/uL (ref 0–200)
Basophils Relative: 1.1 %
Eosinophils Absolute: 173 {cells}/uL (ref 15–500)
Eosinophils Relative: 1.9 %
HCT: 49.3 % (ref 38.5–50.0)
Hemoglobin: 16.3 g/dL (ref 13.2–17.1)
Lymphs Abs: 2712 {cells}/uL (ref 850–3900)
MCH: 28.7 pg (ref 27.0–33.0)
MCHC: 33.1 g/dL (ref 32.0–36.0)
MCV: 86.9 fL (ref 80.0–100.0)
MPV: 11.2 fL (ref 7.5–12.5)
Monocytes Relative: 10.2 %
Neutro Abs: 5187 {cells}/uL (ref 1500–7800)
Neutrophils Relative %: 57 %
Platelets: 274 Thousand/uL (ref 140–400)
RBC: 5.67 Million/uL (ref 4.20–5.80)
RDW: 13.4 % (ref 11.0–15.0)
Total Lymphocyte: 29.8 %
WBC: 9.1 Thousand/uL (ref 3.8–10.8)

## 2021-01-10 LAB — COMPLETE METABOLIC PANEL WITHOUT GFR
AG Ratio: 1.9 (calc) (ref 1.0–2.5)
ALT: 19 U/L (ref 9–46)
AST: 19 U/L (ref 10–35)
Albumin: 4.3 g/dL (ref 3.6–5.1)
Alkaline phosphatase (APISO): 71 U/L (ref 35–144)
BUN: 16 mg/dL (ref 7–25)
CO2: 30 mmol/L (ref 20–32)
Calcium: 10 mg/dL (ref 8.6–10.3)
Chloride: 100 mmol/L (ref 98–110)
Creat: 0.89 mg/dL (ref 0.70–1.35)
Globulin: 2.3 g/dL (ref 1.9–3.7)
Glucose, Bld: 100 mg/dL — ABNORMAL HIGH (ref 65–99)
Potassium: 4.3 mmol/L (ref 3.5–5.3)
Sodium: 139 mmol/L (ref 135–146)
Total Bilirubin: 1.4 mg/dL — ABNORMAL HIGH (ref 0.2–1.2)
Total Protein: 6.6 g/dL (ref 6.1–8.1)
eGFR: 98 mL/min/1.73m2

## 2021-01-10 LAB — IRON, TOTAL/TOTAL IRON BINDING CAP
%SAT: 24 % (ref 20–48)
Iron: 91 ug/dL (ref 50–180)
TIBC: 377 ug/dL (ref 250–425)

## 2021-01-10 LAB — URINALYSIS, ROUTINE W REFLEX MICROSCOPIC
Bacteria, UA: NONE SEEN /HPF
Bilirubin Urine: NEGATIVE
Glucose, UA: NEGATIVE
Hgb urine dipstick: NEGATIVE
Hyaline Cast: NONE SEEN /LPF
Leukocytes,Ua: NEGATIVE
Nitrite: NEGATIVE
Specific Gravity, Urine: 1.019 (ref 1.001–1.035)
Squamous Epithelial / HPF: NONE SEEN /HPF
WBC, UA: NONE SEEN /HPF (ref 0–5)
pH: 6 (ref 5.0–8.0)

## 2021-01-10 LAB — LIPID PANEL
Cholesterol: 147 mg/dL
HDL: 28 mg/dL — ABNORMAL LOW
Non-HDL Cholesterol (Calc): 119 mg/dL
Total CHOL/HDL Ratio: 5.3 (calc) — ABNORMAL HIGH
Triglycerides: 416 mg/dL — ABNORMAL HIGH

## 2021-01-10 LAB — TSH: TSH: 1.09 m[IU]/L (ref 0.40–4.50)

## 2021-01-10 LAB — TESTOSTERONE: Testosterone: 417 ng/dL (ref 250–827)

## 2021-01-10 LAB — MAGNESIUM: Magnesium: 1.8 mg/dL (ref 1.5–2.5)

## 2021-01-10 LAB — MICROALBUMIN / CREATININE URINE RATIO
Creatinine, Urine: 212 mg/dL (ref 20–320)
Microalb Creat Ratio: 18 ug/mg{creat}
Microalb, Ur: 3.9 mg/dL

## 2021-01-10 LAB — VITAMIN D 25 HYDROXY (VIT D DEFICIENCY, FRACTURES): Vit D, 25-Hydroxy: 110 ng/mL — ABNORMAL HIGH (ref 30–100)

## 2021-01-10 LAB — VITAMIN B12: Vitamin B-12: 596 pg/mL (ref 200–1100)

## 2021-01-10 LAB — INSULIN, RANDOM: Insulin: 47.9 u[IU]/mL — ABNORMAL HIGH

## 2021-01-10 LAB — HEMOGLOBIN A1C
Hgb A1c MFr Bld: 5.5 % of total Hgb (ref <5.7)
Mean Plasma Glucose: 111 mg/dL
eAG (mmol/L): 6.2 mmol/L

## 2021-01-10 LAB — PSA: PSA: 0.43 ng/mL (ref <=4.00)

## 2021-01-10 NOTE — Progress Notes (Signed)
============================================================ - Test results slightly outside the reference range are not unusual. If there is anything important, I will review this with you,  otherwise it is considered normal test values.  If you have further questions,  please do not hesitate to contact me at the office or via My Chart.  ============================================================ ============================================================  -  Iron levels are Normal & OK  ============================================================ ============================================================  -  Vitamin B12 level is OK  ============================================================ ============================================================  -  PSA - very Low - Great  ! ============================================================ ============================================================  -  Testosterone Level is Normal  ============================================================ ============================================================  -   Magnesium  -   1.8   -  very  low- goal is betw 2.0 - 2.5,    - So..............[name]  Recommend that you take  Magnesium 500 mg tablet daily   - also important to eat lots of  leafy green vegetables   - spinach - Kale - collards - greens - okra - asparagus  - broccoli - quinoa - squash - almonds   - black, red, white beans  -  peas - green beans ============================================================ ============================================================  -  Total Chol = 147   Excellent   - Very low risk for Heart Attack  / Stroke ============================================================ ============================================================  -  But Triglycerides (                                                                                                                                                416   ) or fats in blood are too high  (goal is less than 150)    - Recommend avoid fried & greasy foods,  sweets / candy,   - Avoid white rice  (brown or wild rice or Quinoa is OK),   - Avoid white potatoes  (sweet potatoes are OK)   - Avoid anything made from white flour  - bagels, doughnuts, rolls, buns, biscuits, white and   wheat breads, pizza crust and traditional  pasta made of white flour & egg white  - (vegetarian pasta or spinach or wheat pasta is OK).    - Multi-grain bread is OK - like multi-grain flat bread or  sandwich thins.   - Avoid alcohol in excess.   - Exercise is also important. ============================================================ ============================================================  -  A1c = 5.5% - back in Normal non-Diabetic range - Great ! ============================================================ ============================================================  -  Vitamin D = 110 - Slightly elevated  - - >                                              Recc cut down on Vit D to 10,000 units 2 x /day  ============================================================ ============================================================  -  All Else - CBC -  Kidneys - Electrolytes - Liver - Magnesium & Thyroid    - all  Normal / OK ============================================================ ============================================================  -  Keep up the [place] Work  !  ============================================================ ============================================================

## 2021-01-14 DIAGNOSIS — J449 Chronic obstructive pulmonary disease, unspecified: Secondary | ICD-10-CM | POA: Diagnosis not present

## 2021-01-14 DIAGNOSIS — J9611 Chronic respiratory failure with hypoxia: Secondary | ICD-10-CM | POA: Diagnosis not present

## 2021-01-17 ENCOUNTER — Ambulatory Visit (INDEPENDENT_AMBULATORY_CARE_PROVIDER_SITE_OTHER): Payer: BC Managed Care – PPO | Admitting: Podiatry

## 2021-01-17 ENCOUNTER — Other Ambulatory Visit: Payer: Self-pay

## 2021-01-17 DIAGNOSIS — D2372 Other benign neoplasm of skin of left lower limb, including hip: Secondary | ICD-10-CM | POA: Diagnosis not present

## 2021-01-17 DIAGNOSIS — B351 Tinea unguium: Secondary | ICD-10-CM | POA: Diagnosis not present

## 2021-01-17 DIAGNOSIS — M722 Plantar fascial fibromatosis: Secondary | ICD-10-CM

## 2021-01-17 DIAGNOSIS — D2371 Other benign neoplasm of skin of right lower limb, including hip: Secondary | ICD-10-CM

## 2021-01-17 DIAGNOSIS — M79676 Pain in unspecified toe(s): Secondary | ICD-10-CM | POA: Diagnosis not present

## 2021-01-17 NOTE — Progress Notes (Signed)
He presents today chief complaint of painful elongated toenails 1-5 bilaterally.  Objective: Toenails are long thick yellow dystrophic onychomycotic no open lesions or wounds pulses are palpable.  Assessment: Pain in limb secondary to onychomycosis.  Plan: Debridement of toenails 1 through 5 bilateral.

## 2021-02-14 ENCOUNTER — Other Ambulatory Visit: Payer: Self-pay | Admitting: Adult Health Nurse Practitioner

## 2021-02-14 DIAGNOSIS — J9611 Chronic respiratory failure with hypoxia: Secondary | ICD-10-CM | POA: Diagnosis not present

## 2021-02-14 DIAGNOSIS — J449 Chronic obstructive pulmonary disease, unspecified: Secondary | ICD-10-CM | POA: Diagnosis not present

## 2021-02-20 ENCOUNTER — Other Ambulatory Visit: Payer: Self-pay | Admitting: Specialist

## 2021-02-25 DIAGNOSIS — G43709 Chronic migraine without aura, not intractable, without status migrainosus: Secondary | ICD-10-CM | POA: Diagnosis not present

## 2021-02-27 ENCOUNTER — Other Ambulatory Visit: Payer: Self-pay | Admitting: Allergy & Immunology

## 2021-02-27 NOTE — Telephone Encounter (Signed)
Please advise to refill as it is not listed on last avs

## 2021-03-07 ENCOUNTER — Other Ambulatory Visit: Payer: Self-pay | Admitting: Allergy & Immunology

## 2021-03-07 DIAGNOSIS — G43719 Chronic migraine without aura, intractable, without status migrainosus: Secondary | ICD-10-CM | POA: Diagnosis not present

## 2021-03-14 ENCOUNTER — Ambulatory Visit (INDEPENDENT_AMBULATORY_CARE_PROVIDER_SITE_OTHER): Payer: BC Managed Care – PPO | Admitting: Allergy & Immunology

## 2021-03-14 ENCOUNTER — Other Ambulatory Visit: Payer: Self-pay

## 2021-03-14 VITALS — BP 124/82 | HR 86 | Temp 98.8°F | Resp 20 | Ht 72.0 in | Wt 357.0 lb

## 2021-03-14 DIAGNOSIS — J449 Chronic obstructive pulmonary disease, unspecified: Secondary | ICD-10-CM

## 2021-03-14 DIAGNOSIS — L2084 Intrinsic (allergic) eczema: Secondary | ICD-10-CM

## 2021-03-14 DIAGNOSIS — J302 Other seasonal allergic rhinitis: Secondary | ICD-10-CM

## 2021-03-14 DIAGNOSIS — B999 Unspecified infectious disease: Secondary | ICD-10-CM

## 2021-03-14 DIAGNOSIS — J3089 Other allergic rhinitis: Secondary | ICD-10-CM | POA: Diagnosis not present

## 2021-03-14 NOTE — Progress Notes (Signed)
FOLLOW UP  Date of Service/Encounter:  03/14/21   Assessment:   Perennial and seasonal allergic rhinitis (sweet vernal grass, box elder, cat, weeds, ragweed, molds, cockroach, dust mite)    Recurrent infections - doing well on prophylactic doxycycline today   Asthma-COPD overlap syndrome - on Trelegy, but wanting to restart Dupixent (AEC 300)   Hymenoptera allergy - EpiPen up to date   GERD - on PRN H2 blocker    Obesity - with resulting nerve impingement and joint pain   Chronic back pain - s/p recent neck surgery   Migraines - on botulinum injections   Polypharmacy  Plan/Recommendations:   1. 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 - Be sure to get the bivalent Omicron booster.  2. Asthma-COPD overlap syndrome - Lung testing looked slightly lower today and it did not improve with the albuterol treatment. - Start the prednisone pack provided today.  - Start Dupixent again every two weeks.  - We will check your breathing again in one month (I just do not want to lose any of the progress we have made). - Consider making another appointment with Pulmonology to discuss your hypoxia (low oxygen level).  - Daily controller medication(s): Trelegy 100/62.5/25 one puff once daily +  - Prior to physical activity: ProAir 2 puffs 10-15 minutes before physical activity. - Rescue medications: ProAir 4 puffs every 4-6 hours as needed or DuoNeb nebulizer one vial every 4-6 hours as needed - Asthma control goals:  * Full participation in all desired activities (may need albuterol before activity) * Albuterol use two time or less a week on average (not counting use with activity) * Cough interfering with sleep two time or less a month * Oral steroids no more than once a year * No hospitalizations   3. Chronic allergic rhinitis (sweet vernal grass, box elder, cat,  weeds, ragweed, molds, cockroach, dust mite) - Continue with aszelastine/fluticasone 2 sprays per nostril up to twice daily. - Continue with saline mist 1-2 times daily.  - Continue with cetirizine 10mg  daily as needed for breakthrough symptoms.  4. GERD - Continue famotidine as needed.  5. Return in about 4 months (around 07/15/2021).    Subjective:   Mark Harrell is a 60 y.o. male presenting today for follow up of  Chief Complaint  Patient presents with   Follow-up    Mark Harrell has a history of the following: Patient Active Problem List   Diagnosis Date Noted   Herniation of cervical intervertebral disc with radiculopathy    Status post cervical discectomy 08/20/2020   Tracheomalacia 05/26/2019   Acquired tracheomalacia 05/26/2019   HNP (herniated nucleus pulposus) with myelopathy, cervical 05/23/2019    Class: Chronic   Spinal stenosis of cervical region 05/23/2019    Class: Chronic   Status post cervical spinal fusion 05/23/2019   Chronic respiratory failure with hypoxia ([place]) 04/12/2018   Bilateral lower extremity edema 12/29/2017   Hymenoptera allergy 11/10/2017   Diverticulosis 08/07/2017   Family history of colonic polyps 08/07/2017   Myofascial pain 08/06/2017   Seasonal and perennial allergic rhinitis 04/23/2017   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/2017   Cervicalgia 04/01/2017   Other spondylosis with radiculopathy, cervical region 02/09/2017   Memory difficulty 12/05/2016   Morbid obesity ([place]) 12/05/2016   Migraine without aura and without status migrainosus, not intractable 10/30/2016   Lumbar radiculopathy 05/15/2016  Osteoarthritis of spine with radiculopathy, lumbar region 05/15/2016   Risk for falls 05/15/2016   Recurrent infections 03/29/2016   Chronic nonseasonal allergic rhinitis due to pollen 03/29/2016   Polypharmacy 01/16/2016   Morbid obesity with BMI of 50.0-59.9, adult ([place]) 03/06/2015   SDAT 02/05/2015   OSA and COPD overlap syndrome ([place])  02/05/2015   Medication management 08/02/2014   GERD (gastroesophageal reflux disease) 05/09/2014   Vitamin D deficiency 08/01/2013   Prediabetes 08/01/2013   Positive TB test 07/29/2011   Diverticula of colon 05/07/2011   Hypertension 01/31/2011   Hyperlipidemia, mixed 01/31/2011   BPH (benign prostatic hyperplasia) 01/31/2011   Testosterone Deficiency 01/31/2011   IBS (irritable bowel syndrome) 01/31/2011   Partial complex seizure disorder with intractable epilepsy ([place]) 01/31/2011   Depression, major, recurrent, in partial remission ([place]) 01/31/2011   Asthma-COPD overlap syndrome ([place]) 01/31/2011    History obtained from: chart review and patient.  Mark Harrell is a 60 y.o. male presenting for a follow up visit.  He was last seen in June 2022 via televisit.  At that time, he was doing very well.  He did not make any medication changes.  He was interested in starting [place] again.  His infectious history was unremarkable.  He was doing well on doxycycline twice daily.  Since last visit, he has done fairly well.  Asthma/Respiratory Symptom History: He remains on the Trelegy 1 puff once daily.  He also has not used it in continuing with the [place], but he seems to only want to use it when he is going to be outdoors rather than every 2 weeks. He has not been using his rescue inhaler much at all. Symptoms are overall well controlled. He does have nighttime oxygen which was placed by his Pulmonologist. Review of the chart shows that he saw Dr. [name] in the past. Pulse ox has been as long as 92%. But with the O2 - especially at night - he is higher with regards to his O2.   Allergic Rhinitis Symptom History: He remains on the Dymista 2 sprays per nostril up to twice daily.  He also uses cetirizine 10 mg daily.  He has not needed antibiotics at all.  He remains on the doxycycline 100 mg twice daily.  He has not gotten the omicron specific booster at this point.  He remains on the doxycycline BID. He  has not had any breakthrough infections since that time. He has been very fortunate.  He does not remember the last time he had any kind of infection.  He has been staying at home, however.  GERD Symptom History: He remains on famotidine twice daily   Ms. Bonnita Nasuti is under hospice care. She recently got over pneumonia.  Otherwise, there have been no changes to his past medical history, surgical history, family history, or social history.    Review of Systems  Constitutional: Negative.  Negative for chills, fever, malaise/fatigue and weight loss.  HENT:  Negative for congestion, ear discharge, ear pain and sinus pain.   Eyes:  Negative for pain, discharge and redness.  Respiratory:  Negative for cough, sputum production, shortness of breath and wheezing.   Cardiovascular: Negative.  Negative for chest pain and palpitations.  Gastrointestinal:  Negative for abdominal pain, constipation, diarrhea, heartburn, nausea and vomiting.  Skin: Negative.  Negative for itching and rash.  Neurological:  Negative for dizziness and headaches.  Endo/Heme/Allergies:  Positive for environmental allergies. Does not bruise/bleed easily.      Objective:   Blood  pressure 124/82, pulse 86, temperature 98.8 F (37.1 C), temperature source Temporal, resp. rate 20, height 6' (1.829 m), weight (!) 357 lb (161.9 kg), SpO2 94 %. Body mass index is 48.42 kg/m.   Physical Exam:  Physical Exam Vitals reviewed.  Constitutional:      Appearance: He is well-developed. He is obese.  HENT:     Head: Normocephalic and atraumatic.     Right Ear: Tympanic membrane, ear canal and external ear normal.     Left Ear: Tympanic membrane, ear canal and external ear normal.     Nose: No nasal deformity, septal deviation, mucosal edema or rhinorrhea.     Right Turbinates: Enlarged, swollen and pale.     Left Turbinates: Enlarged, swollen and pale.     Right Sinus: No maxillary sinus tenderness or frontal sinus tenderness.      Left Sinus: No maxillary sinus tenderness or frontal sinus tenderness.     Mouth/Throat:     Mouth: Mucous membranes are not pale and not dry.     Pharynx: Uvula midline.  Eyes:     General: Lids are normal. No allergic shiner.       Right eye: No discharge.        Left eye: No discharge.     Conjunctiva/sclera: Conjunctivae normal.     Right eye: Right conjunctiva is not injected. No chemosis.    Left eye: Left conjunctiva is not injected. No chemosis.    Pupils: Pupils are equal, round, and reactive to light.  Cardiovascular:     Rate and Rhythm: Normal rate and regular rhythm.     Heart sounds: Normal heart sounds.  Pulmonary:     Effort: Pulmonary effort is normal. No tachypnea, accessory muscle usage or respiratory distress.     Breath sounds: Normal breath sounds. No wheezing, rhonchi or rales.  Chest:     Chest wall: No tenderness.  Lymphadenopathy:     Cervical: No cervical adenopathy.  Skin:    General: Skin is warm.     Capillary Refill: Capillary refill takes less than 2 seconds.     Coloration: Skin is not pale.     Findings: No abrasion, erythema, petechiae or rash. Rash is not papular, urticarial or vesicular.  Neurological:     Mental Status: He is alert.  Psychiatric:        Behavior: Behavior is cooperative.     Diagnostic studies:   Spirometry: results abnormal (FEV1: 2.26/58%, FVC: 3.16/62%, FEV1/FVC: 72%).    Spirometry consistent with possible restrictive disease. We did four puffs of albuterol with no improvement in symptoms.    Allergy Studies: none        [name], MD  [facility]

## 2021-03-14 NOTE — Patient Instructions (Addendum)
1. 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 - Be sure to get the bivalent Omicron booster.  2. Asthma-COPD overlap syndrome - Lung testing looked slightly lower today and it did not improve with the albuterol treatment. - Start the prednisone pack provided today.  - Start Dupixent again every two weeks.  - We will check your breathing again in one month (I just do not want to lose any of the progress we have made). - Consider making another appointment with Pulmonology to discuss your hypoxia (low oxygen level).  - Daily controller medication(s): Trelegy 100/62.5/25 one puff once daily +  - Prior to physical activity: ProAir 2 puffs 10-15 minutes before physical activity. - Rescue medications: ProAir 4 puffs every 4-6 hours as needed or DuoNeb nebulizer one vial every 4-6 hours as needed - Asthma control goals:  * Full participation in all desired activities (may need albuterol before activity) * Albuterol use two time or less a week on average (not counting use with activity) * Cough interfering with sleep two time or less a month * Oral steroids no more than once a year * No hospitalizations   3. Chronic allergic rhinitis (sweet vernal grass, box elder, cat, weeds, ragweed, molds, cockroach, dust mite) - Continue with aszelastine/fluticasone 2 sprays per nostril up to twice daily. - Continue with saline mist 1-2 times daily.  - Continue with cetirizine 10mg  daily as needed for breakthrough symptoms.  4. GERD - Continue famotidine as needed.  5. Return in about 4 months (around 07/15/2021).     Please inform us of any Emergency Department visits, hospitalizations, or changes in symptoms. Call us before going to the ED for breathing or allergy symptoms since we might be able to fit you in for a sick visit. Feel free to contact us anytime with any questions, problems, or  concerns.  It was a pleasure to see you again today!  Websites that have reliable patient information: 1. American Academy of Asthma, Allergy, and Immunology: www.aaaai.org 2. Food Allergy Research and Education (FARE): foodallergy.org 3. Mothers of Asthmatics: http://www.asthmacommunitynetwork.org 4. American College of Allergy, Asthma, and Immunology: www.acaai.org   COVID-19 Vaccine Information can be found at: [email or web address].[email or web address].uk For questions related to vaccine distribution or appointments, please email [email or web address] or call [phone].   We realize that you might be concerned about having an allergic reaction to the COVID19 vaccines. To help with that concern, WE ARE OFFERING THE COVID19 VACCINES IN OUR OFFICE! Ask the front desk for dates!     "Like" [place] on Facebook and Instagram for our latest updates!      A healthy democracy works best when [employer] participate! Make sure you are registered to vote! If you have moved or changed any of your contact information, you will need to get this updated before voting!  In some cases, you MAY be able to register to vote online: [email or web address]

## 2021-03-16 ENCOUNTER — Other Ambulatory Visit: Payer: Self-pay | Admitting: Allergy & Immunology

## 2021-03-16 DIAGNOSIS — J9611 Chronic respiratory failure with hypoxia: Secondary | ICD-10-CM | POA: Diagnosis not present

## 2021-03-16 DIAGNOSIS — J449 Chronic obstructive pulmonary disease, unspecified: Secondary | ICD-10-CM | POA: Diagnosis not present

## 2021-03-19 ENCOUNTER — Encounter: Payer: Self-pay | Admitting: Allergy & Immunology

## 2021-03-28 ENCOUNTER — Telehealth: Payer: Self-pay | Admitting: *Deleted

## 2021-03-28 MED ORDER — DUPILUMAB 300 MG/2ML ~~LOC~~ SOSY: 300.0000 mg | PREFILLED_SYRINGE | SUBCUTANEOUS | 11 refills | Status: DC

## 2021-03-28 NOTE — Telephone Encounter (Signed)
Called patient and advised reapproval for [place] and should reach out to [place] and patient already has number to same

## 2021-03-28 NOTE — Telephone Encounter (Signed)
Great - thanks much!   Alzora Ha, MD [facility]  

## 2021-03-31 ENCOUNTER — Other Ambulatory Visit: Payer: Self-pay | Admitting: Internal Medicine

## 2021-04-01 ENCOUNTER — Other Ambulatory Visit: Payer: Self-pay | Admitting: Specialist

## 2021-04-11 ENCOUNTER — Ambulatory Visit (INDEPENDENT_AMBULATORY_CARE_PROVIDER_SITE_OTHER): Payer: BC Managed Care – PPO | Admitting: Allergy & Immunology

## 2021-04-11 ENCOUNTER — Other Ambulatory Visit: Payer: Self-pay

## 2021-04-11 VITALS — BP 150/88 | HR 102 | Temp 98.8°F | Resp 20 | Ht 72.0 in | Wt 361.2 lb

## 2021-04-11 DIAGNOSIS — J449 Chronic obstructive pulmonary disease, unspecified: Secondary | ICD-10-CM | POA: Diagnosis not present

## 2021-04-11 DIAGNOSIS — B999 Unspecified infectious disease: Secondary | ICD-10-CM

## 2021-04-11 DIAGNOSIS — L2084 Intrinsic (allergic) eczema: Secondary | ICD-10-CM | POA: Diagnosis not present

## 2021-04-11 DIAGNOSIS — J3089 Other allergic rhinitis: Secondary | ICD-10-CM

## 2021-04-11 DIAGNOSIS — J302 Other seasonal allergic rhinitis: Secondary | ICD-10-CM

## 2021-04-11 MED ORDER — TRELEGY ELLIPTA 100-62.5-25 MCG/ACT IN AEPB: 1.0000 | INHALATION_SPRAY | Freq: Every day | RESPIRATORY_TRACT | 5 refills | Status: DC

## 2021-04-11 MED ORDER — FLUTICASONE PROPIONATE 50 MCG/ACT NA SUSP
2.0000 | Freq: Every day | NASAL | 1 refills | Status: DC
Start: 2021-04-11 — End: 2021-12-09

## 2021-04-11 MED ORDER — EPINEPHRINE 0.3 MG/0.3ML IJ SOAJ: 0.3000 mg | INTRAMUSCULAR | 2 refills | Status: DC | PRN

## 2021-04-11 MED ORDER — MONTELUKAST SODIUM 10 MG PO TABS: ORAL_TABLET | ORAL | 1 refills | Status: DC

## 2021-04-11 MED ORDER — IPRATROPIUM-ALBUTEROL 0.5-2.5 (3) MG/3ML IN SOLN
3.0000 mL | RESPIRATORY_TRACT | 1 refills | Status: DC | PRN
Start: 2021-04-11 — End: 2021-09-24

## 2021-04-11 MED ORDER — AZELASTINE HCL 137 MCG/SPRAY NA SOLN: 2.0000 | Freq: Two times a day (BID) | NASAL | 5 refills | Status: DC

## 2021-04-11 MED ORDER — ALBUTEROL SULFATE HFA 108 (90 BASE) MCG/ACT IN AERS: 2.0000 | INHALATION_SPRAY | RESPIRATORY_TRACT | 1 refills | Status: DC | PRN

## 2021-04-11 MED ORDER — CETIRIZINE HCL 10 MG PO TABS
10.0000 mg | ORAL_TABLET | Freq: Two times a day (BID) | ORAL | 5 refills | Status: DC | PRN
Start: 2021-04-11 — End: 2022-04-28

## 2021-04-11 NOTE — Progress Notes (Signed)
FOLLOW UP  Date of Service/Encounter:  04/11/21   Assessment:   Perennial and seasonal allergic rhinitis (sweet vernal grass, box elder, cat, weeds, ragweed, molds, cockroach, dust mite)    Recurrent infections - doing well on prophylactic doxycycline today   Asthma-COPD overlap syndrome - on Trelegy, but wanting to restart Dupixent (AEC 300)   Hymenoptera allergy - EpiPen up to date   GERD - on PRN H2 blocker    Obesity - with resulting nerve impingement and joint pain   Chronic back pain - s/p recent neck surgery   Migraines - on botulinum injections   Polypharmacy  Plan/Recommendations:   1. 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 - Be sure to get the bivalent Omicron booster.  2. Asthma-COPD overlap syndrome - Lung testing looked better today. - We are going to increase to the higher dose Trelegy.  - Daily controller medication(s): Trelegy 200/62.5/25 one puff once daily + Dupixent every two weeks. - Prior to physical activity: ProAir 2 puffs 10-15 minutes before physical activity. - Rescue medications: ProAir 4 puffs every 4-6 hours as needed or DuoNeb nebulizer one vial every 4-6 hours as needed - Asthma control goals:  * Full participation in all desired activities (may need albuterol before activity) * Albuterol use two time or less a week on average (not counting use with activity) * Cough interfering with sleep two time or less a month * Oral steroids no more than once a year * No hospitalizations   3. Chronic allergic rhinitis (sweet vernal grass, box elder, cat, weeds, ragweed, molds, cockroach, dust mite) - Continue with aszelastine/fluticasone 2 sprays per nostril up to twice daily. - Continue with saline mist 1-2 times daily.  - Continue with cetirizine 10mg  daily as needed for breakthrough symptoms.  4. GERD - Continue famotidine as  needed.  5. Return in about 3 months (around 07/12/2021).    Subjective:   Mark Harrell is a 60 y.o. male presenting today for follow up of  Chief Complaint  Patient presents with   Follow-up    Patient in today for a follow up and states he has been more short of breath than normal.    Mark Harrell has a history of the following: Patient Active Problem List   Diagnosis Date Noted   Herniation of cervical intervertebral disc with radiculopathy    Status post cervical discectomy 08/20/2020   Tracheomalacia 05/26/2019   Acquired tracheomalacia 05/26/2019   HNP (herniated nucleus pulposus) with myelopathy, cervical 05/23/2019    Class: Chronic   Spinal stenosis of cervical region 05/23/2019    Class: Chronic   Status post cervical spinal fusion 05/23/2019   Chronic respiratory failure with hypoxia ([place]) 04/12/2018   Bilateral lower extremity edema 12/29/2017   Hymenoptera allergy 11/10/2017   Diverticulosis 08/07/2017   Family history of colonic polyps 08/07/2017   Myofascial pain 08/06/2017   Seasonal and perennial allergic rhinitis 04/23/2017   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/2017   Cervicalgia 04/01/2017   Other spondylosis with radiculopathy, cervical region 02/09/2017   Memory difficulty 12/05/2016   Morbid obesity ([place]) 12/05/2016   Migraine without aura and without status migrainosus, not intractable 10/30/2016   Lumbar radiculopathy 05/15/2016   Osteoarthritis of spine with radiculopathy, lumbar region 05/15/2016   Risk for falls 05/15/2016   Recurrent infections 03/29/2016   Chronic nonseasonal allergic rhinitis due to pollen  03/29/2016   Polypharmacy 01/16/2016   Morbid obesity with BMI of 50.0-59.9, adult ([place]) 03/06/2015   SDAT 02/05/2015   OSA and COPD overlap syndrome ([place]) 02/05/2015   Medication management 08/02/2014   GERD (gastroesophageal reflux disease) 05/09/2014   Vitamin D deficiency 08/01/2013   Prediabetes 08/01/2013   Positive TB test 07/29/2011    Diverticula of colon 05/07/2011   Hypertension 01/31/2011   Hyperlipidemia, mixed 01/31/2011   BPH (benign prostatic hyperplasia) 01/31/2011   Testosterone Deficiency 01/31/2011   IBS (irritable bowel syndrome) 01/31/2011   Partial complex seizure disorder with intractable epilepsy ([place]) 01/31/2011   Depression, major, recurrent, in partial remission ([place]) 01/31/2011   Asthma-COPD overlap syndrome ([place]) 01/31/2011    History obtained from: chart review and patient.  Mark Harrell is a 60 y.o. male presenting for a follow up visit.  He was last seen in May 2022.  At that time, we continue with doxycycline 100 mg twice daily for prophylaxis.  We also encouraged him to get his new his COVID-vaccine. For his asthma COPD overlap, his lung testing looks.  We started him on a prednisone Dosepak.  We also recommended restarting the Dupixent.  Urged him to make an appointment with pulmonology to discuss his hypoxia.  We continue with Trelegy 1 puff once daily as well as albuterol as needed.  For his rhinitis, we continued with Dymista 2 sprays per nostril twice daily as well as cetirizine 10 mg daily.  GERD was controlled with famotidine as needed.  Since the last visit, he has done well.   Asthma/Respiratory Symptom History: He did complete the prednisone and he felt that it was helpful. He is having some SOB with physical activity. He is back on Dupixent and he has not really felt much in the way of relief. He has not been using his rescue inhaler much at all. He does use some albuterol before physical activity.  He remains on the Trelegy one puff once daily. This has been a worse year for his regarding his breathing status. He has not been 100% since that time. Pulse ox all depends on how much.  He has never had a sleep study.  Allergic Rhinitis Symptom History: He remains on Dymista as well as cetirizine.  He has not needed additional antibiotics since last visit.  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  Just finished another back surgery.  All of the surgeries have interrupted his plan to lose weight.  He knows that his increased weight is affecting his breathing and other health related items.  He was going to do it an intensive weight loss program at [facility] at some point, this was well before the pandemic and before his mother took a turn for the worse.  Otherwise, there have been no changes to his past medical history, surgical history, family history, or social history.    Review of Systems  Constitutional:  Positive for malaise/fatigue. Negative for chills, fever and weight loss.  HENT:  Positive for congestion and sinus pain. Negative for ear discharge and ear pain.   Eyes:  Negative for pain, discharge and redness.  Respiratory:  Positive for sputum production and shortness of breath. Negative for cough and wheezing.   Cardiovascular: Negative.  Negative for chest pain and palpitations.  Gastrointestinal:  Negative for abdominal pain, constipation, diarrhea, heartburn, nausea and vomiting.  Skin: Negative.  Negative for itching and rash.  Neurological:  Negative for dizziness and headaches.  Endo/Heme/Allergies:  Positive for environmental allergies. Does not bruise/bleed easily.      Objective:   Blood pressure (!) 150/88, pulse (!) 102, temperature 98.8 F (37.1 C), temperature source Temporal, resp. rate 20, height 6' (1.829 m), weight (!) 361 lb 3.2 oz (163.8 kg), SpO2 94 %. Body mass index is 48.99 kg/m.   Physical Exam:  Physical Exam Vitals reviewed.  Constitutional:      Appearance: He is well-developed.  HENT:     Head: Normocephalic and atraumatic.     Right Ear: Tympanic membrane, ear canal and external ear normal.     Left Ear: Tympanic membrane, ear canal and external ear normal.     Nose: No nasal deformity, septal deviation, mucosal edema or rhinorrhea.     Right Turbinates: Enlarged, swollen and pale.      Left Turbinates: Enlarged, swollen and pale.     Right Sinus: No maxillary sinus tenderness or frontal sinus tenderness.     Left Sinus: No maxillary sinus tenderness or frontal sinus tenderness.     Mouth/Throat:     Mouth: Mucous membranes are not pale and not dry.     Pharynx: Uvula midline.  Eyes:     General: Lids are normal. Allergic shiner present.        Right eye: No discharge.        Left eye: No discharge.     Conjunctiva/sclera: Conjunctivae normal.     Right eye: Right conjunctiva is not injected. No chemosis.    Left eye: Left conjunctiva is not injected. No chemosis.    Pupils: Pupils are equal, round, and reactive to light.  Cardiovascular:     Rate and Rhythm: Normal rate and regular rhythm.     Heart sounds: Normal heart sounds.  Pulmonary:     Effort: Pulmonary effort is normal. No tachypnea, accessory muscle usage or respiratory distress.     Breath sounds: Normal breath sounds. No wheezing, rhonchi or rales.     Comments: Fairly normal exam.  It is hard to evaluate bases due to his body habitus. Chest:     Chest wall: No tenderness.  Lymphadenopathy:     Cervical: No cervical adenopathy.  Skin:    Coloration: Skin is not pale.     Findings: No abrasion, erythema, petechiae or rash. Rash is not papular, urticarial or vesicular.  Neurological:     Mental Status: He is alert.  Psychiatric:        Behavior: Behavior is cooperative.     Diagnostic studies:    Spirometry: results abnormal (FEV1: 2.41/62%, FVC: 3.21/63%, FEV1/FVC: 75%).    Spirometry consistent with possible restrictive disease.   Allergy Studies: none      [name], MD  [facility] [place] [facility] [place]

## 2021-04-11 NOTE — Patient Instructions (Addendum)
1. 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 - Be sure to get the bivalent Omicron booster.  2. Asthma-COPD overlap syndrome - Lung testing looked better today. - We are going to increase to the higher dose Trelegy.  - Daily controller medication(s): Trelegy 200/62.5/25 one puff once daily + Dupixent every two weeks. - Prior to physical activity: ProAir 2 puffs 10-15 minutes before physical activity. - Rescue medications: ProAir 4 puffs every 4-6 hours as needed or DuoNeb nebulizer one vial every 4-6 hours as needed - Asthma control goals:  * Full participation in all desired activities (may need albuterol before activity) * Albuterol use two time or less a week on average (not counting use with activity) * Cough interfering with sleep two time or less a month * Oral steroids no more than once a year * No hospitalizations   3. Chronic allergic rhinitis (sweet vernal grass, box elder, cat, weeds, ragweed, molds, cockroach, dust mite) - Continue with aszelastine/fluticasone 2 sprays per nostril up to twice daily. - Continue with saline mist 1-2 times daily.  - Continue with cetirizine 10mg  daily as needed for breakthrough symptoms.  4. GERD - Continue famotidine as needed.  5. Return in about 3 months (around 07/12/2021).     Please inform us of any Emergency Department visits, hospitalizations, or changes in symptoms. Call us before going to the ED for breathing or allergy symptoms since we might be able to fit you in for a sick visit. Feel free to contact us anytime with any questions, problems, or concerns.  It was a pleasure to see you again today!  Websites that have reliable patient information: 1. American Academy of Asthma, Allergy, and Immunology: www.aaaai.org 2. Food Allergy Research and Education (FARE): foodallergy.org 3. Mothers of Asthmatics:  http://www.asthmacommunitynetwork.org 4. American College of Allergy, Asthma, and Immunology: www.acaai.org   COVID-19 Vaccine Information can be found at: [email or web address].co.uk For questions related to vaccine distribution or appointments, please email [email or web address] or call [phone].   We realize that you might be concerned about having an allergic reaction to the COVID19 vaccines. To help with that concern, WE ARE OFFERING THE COVID19 VACCINES IN OUR OFFICE! Ask the front desk for dates!     "Like" [place] on Facebook and Instagram for our latest updates!      A healthy democracy works best when [employer] participate! Make sure you are registered to vote! If you have moved or changed any of your contact information, you will need to get this updated before voting!  In some cases, you MAY be able to register to vote online: [email or web address]

## 2021-04-12 ENCOUNTER — Encounter: Payer: Self-pay | Admitting: Allergy & Immunology

## 2021-04-15 NOTE — Progress Notes (Deleted)
3 Month Follow Up   Assessment and Plan:  Mark Harrell was seen today for follow-up.  Diagnoses and all orders for this visit:  Essential hypertension Continue current medications: Monitor blood pressure at home; call if consistently over 130/80 Continue DASH diet.   Reminder to go to the ER if any CP, SOB, nausea, dizziness, severe HA, changes vision/speech, left arm numbness and tingling and jaw pain. -     CBC with Diff -     COMPLETE METABOLIC PANEL WITH GFR -     TSH -     Magnesium  Gastroesophageal reflux disease without esophagitis Doing well at this time Continue:  Diet discussed Monitor for triggers Avoid food with high acid content Avoid excessive cafeine Increase water intake  OSA and COPD overlap syndrome (HCC) Does not use CPAP, has caused night terrors  Chronic nonseasonal allergic rhinitis due to pollen Doing well at this time Follows with Asthma-Allergist  Asthma-COPD overlap syndrome ([place]) Continue medications Follow with Pulmonology Continue to monitor  Irritable bowel syndrome, unspecified type Doing well at this time Follow with GI  Benign prostatic hyperplasia with lower urinary tract symptoms, symptom details unspecified Taking alfuzosin 10mg  Nocturia x2, no change  Diverticula of colon Doing well at this time Continue to monitor Follows iwth GI  Migraine without aura and without status migrainosus, not intractable Botox injections, follows with Neurology  Vascular dementia without behavioral disturbance ([place]) Follows with nuerology  Depression, major, recurrent, in partial remission ([place]) Continue medications:  Discussed stress management techniques  Discussed, increase water,intake & good sleep hygiene  Discussed increasing exercise & vegetables in diet  Morbid obesity with BMI of 45.0-49.9, adult (HCC) Discussed dietary and exercise modifications  Medication management Continued  Hyperlipidemia, mixed Continue  medications: Discussed dietary and exercise modifications Low fat diet -     Lipid Profile  DMII w/ stage 1 CKD Continue medications: Discussed general issues about diabetes pathophysiology and management. Education: Reviewed 'ABCs' of diabetes management (respective goals in parentheses):  [name], blood pressure (<130/80), and cholesterol ([name]) Dietary recommendations Encouraged aerobic exercise.  Discussed foot care, check daily Yearly retinal exam Dental exam every 6 months Monitor blood glucose, discussed goal for patient -     Insulin, random -  Hemoglobin A1c  Vitamin D deficiency Continue supplementation Taking Vitamin D * IU daily -     Vitamin D (25 hydroxy)  Polypharmacy Discussed medications with patient at length, conerned patient is aware of medications and what they are for. Review with patient Q73months   Continue diet and meds as discussed. Further disposition pending results of labs. Discussed med's effects and SE's.  Patient agrees with plan of care and opportunity to ask questions/voice concerns. Over 40 minutes of chart review, interview, exam, counseling, and critical decision making was performed.   Future Appointments  Date Time Provider [place]  04/17/2021  3:30 PM Magda Bernheim, NP [name]  04/23/2021  3:45 PM Starkweather, Bennie Pierini T, DPM [name]  07/16/2021  3:15 PM [name], MD [name]  07/18/2021  3:30 PM [name], MD [name]  01/09/2022  3:00 PM [name], MD [name]    ----------------------------------------------------------------------------------------------------------------------  HPI 60 y.o. male  presents for 3 month follow up on HTN, HLD, Allergic Asthma, restrictive lung disease, testosterone deficiency, morbid obesity BMI 46, DMII.  He follows with GI, Dr [name], Nephrology Dr [name], Neurology for migraine management with Botox injections, Pulmonology Dr [name],  Cardiology Dr [name] once a  year.  He is following with Dr [name] orthopedics for cervicalgia  He has history of OSA but reports mask intolerance.    Spinal stenosis, cervical, left numbness tingling  Follow with Dr [name], Orthopedic, for this.  This started after halloween 2019 after a fall.  He reports he was to have surgery but postponed related to [place].  They have restarted this clearance.   BMI is There is no height or weight on file to calculate BMI., he has not been working on diet and exercise. Wt Readings from Last 3 Encounters:  04/11/21 (!) 361 lb 3.2 oz (163.8 kg)  03/14/21 (!) 357 lb (161.9 kg)  01/09/21 (!) 356 lb (161.5 kg)      His blood pressure has been controlled at home, today their BP is    He does not workout. He denies any cardiac symptoms, chest pains, palpitations, shortness of breath, dizziness or lower extremity edema.     He is on cholesterol medication Pravastatin and denies myalgias.   His cholesterol is not at goal. The cholesterol last visit was:   Lab Results  Component Value Date   CHOL 147 01/09/2021   HDL 28 (L) 01/09/2021   [place]  01/09/2021     Comment:     . LDL cholesterol not calculated. Triglyceride levels greater than 400 mg/dL invalidate calculated LDL results. . Reference range: <100 . Desirable range <100 mg/dL for primary prevention;   <70 mg/dL for patients with CHD or diabetic patients  with > or = 2 CHD risk factors. [name] LDL-C is now calculated using the Martin-Hopkins  calculation, which is a validated novel method providing  better accuracy than the Friedewald equation in the  estimation of LDL-C.  Cresenciano Genre et al. Annamaria Helling. 7616;073(71): 2061-2068  (http://education.[employer].com/faq/FAQ164)    TRIG 416 (H) 01/09/2021   CHOLHDL 5.3 (H) 01/09/2021    He has not been working on diet and exercise for prediabetes, and denies nausea, polydipsia, polyuria, visual disturbances, vomiting and weight loss. Last A1C in the  office was:  Lab Results  Component Value Date   HGBA1C 5.5 01/09/2021   Patient is on Vitamin D supplement.   Lab Results  Component Value Date   VD25OH 110 (H) 01/09/2021     BPH Currently he is symptomatic and endorses nocturia x 2 a night and denies urinary hesitancy, urinary frequency, incomplete voiding, double voiding, weak stream, perineal discomfort, hematuria, abdominal pain, flank pain and testicular pain.  He is taking Alfuzosin 10mg . He is not following with urology at this time and last results were in normal. Lab Results  Component Value Date   PSA 0.43 01/09/2021      Current Medications:  Current Outpatient Medications on File Prior to Visit  Medication Sig   acetaminophen (TYLENOL) 500 MG tablet Take 1,000 mg by mouth every 6 (six) hours as needed for moderate pain.   albuterol (VENTOLIN HFA) 108 (90 Base) MCG/ACT inhaler Inhale 2 puffs into the lungs every 4 (four) hours as needed for wheezing or shortness of breath.   anastrozole (ARIMIDEX) 1 MG tablet Take 1 mg by mouth daily.   ascorbic acid (VITAMIN C) 1000 MG tablet Take 1,000 mg by mouth every evening.   Azelastine HCl 137 MCG/SPRAY SOLN Place 2 sprays into both nostrils 2 (two) times daily. USE 1 TO 2 SPRAYS EACH NOSTRIL 2 X /DAY   bisoprolol-hydrochlorothiazide (ZIAC) 5-6.25 MG tablet TAKE 1 TABLET DAILY FOR BLOOD PRESSURE (Patient taking differently: Take 1 tablet by mouth  daily. FOR BLOOD PRESSURE)   Botulinum Toxin Type A 200 units SOLR Inject 200 Units into the skin every 3 (three) months.   celecoxib (CELEBREX) 200 MG capsule TAKE 1 CAPSULE (200 MG TOTAL) BY MOUTH EVERY 12 (TWELVE) HOURS.   cetaphil (CETAPHIL) lotion Apply topically 2 (two) times daily. (Patient taking differently: Apply 1 application topically 2 (two) times daily.)   cetirizine (ZYRTEC) 10 MG tablet Take 1 tablet (10 mg total) by mouth 2 (two) times daily as needed for allergies (Can take an extra dose during flares).    Cholecalciferol (VITAMIN D3) 125 MCG (5000 UT) TABS Take 5 tablets (25,000 Units total) by mouth See admin instructions. Take 15000 in the morning and 10,000 in the evening   Cinnamon 500 MG capsule Take 1,000 mg by mouth 3 (three) times daily.   clotrimazole-betamethasone (LOTRISONE) cream APPLY TO AFFECTED AREA TWICE A DAY (Patient taking differently: Apply 1 application topically 2 (two) times daily as needed (rash).)   Coenzyme Q10 (CO Q 10 PO) Take 600 mg by mouth daily.   cyclobenzaprine (FLEXERIL) 10 MG tablet Take 10 mg by mouth 3 (three) times daily.   diclofenac Sodium (VOLTAREN) 1 % GEL APPLY 2 TO 4 GRAMS TOPICALLY 2 TO 4 TIMES DAILY FOR PAIN & INFLAMMATION( OTC NOT COVERED)   donepezil (ARICEPT) 23 MG TABS tablet Take 23 mg by mouth daily.   doxycycline (VIBRAMYCIN) 100 MG capsule TAKE 1 CAPSULE BY MOUTH TWICE A DAY   dupilumab (DUPIXENT) 300 MG/2ML prefilled syringe Inject 300 mg into the skin every 14 (fourteen) days.   Eluxadoline 100 MG TABS Take 100 mg by mouth 2 (two) times daily.    EPINEPHrine 0.3 mg/0.3 mL IJ SOAJ injection Inject 0.3 mg into the muscle as needed.   fluticasone (FLONASE) 50 MCG/ACT nasal spray Place 2 sprays into both nostrils daily. Use 1 to 2 sprays each Nostril 2 x /day   Fluticasone-Umeclidin-Vilant (TRELEGY ELLIPTA) 100-62.5-25 MCG/ACT AEPB Inhale 1 puff into the lungs daily.   furosemide (LASIX) 80 MG tablet TAKE 1 TO 1 & 1/2 TABLETS 2 X /DAY FOR FLUID RETENTION & SWELLING OF LEGS   Guaifenesin 1200 MG TB12 Take 1,200 mg by mouth 2 (two) times daily.   ipratropium-albuterol (DUONEB) 0.5-2.5 (3) MG/3ML SOLN Take 3 mLs by nebulization every 4 (four) hours as needed.   Krill Oil 500 MG CAPS Take 500 mg by mouth at bedtime.   L-METHYLFOLATE-B6-B12 PO Take 1 tablet by mouth daily.   Magnesium 500 MG CAPS Take 500 mg by mouth daily with supper.   memantine (NAMENDA) 10 MG tablet Take 10 mg by mouth 2 (two) times daily.   metFORMIN (GLUCOPHAGE-XR) 500 MG 24 hr  tablet Takes 2 tablets  2 x /day  with Meals  for Diabetes   Misc Natural Products (GLUCOS-CHONDROIT-MSM COMPLEX) TABS Take 2 tablets by mouth 3 (three) times daily.   montelukast (SINGULAIR) 10 MG tablet TAKE 1 TABLET BY MOUTH DAILY FOR ALLERGIES   Multiple Minerals-Vitamins (CITRACAL MAXIMUM PLUS) TABS Take 1 tablet by mouth 2 (two) times daily. Vitamin D   Multiple Vitamins-Minerals (MULTIVITAMIN WITH MINERALS) tablet Take 1 tablet by mouth daily.   Multiple Vitamins-Minerals (PRESERVISION AREDS 2) CAPS Take 1 capsule by mouth 2 (two) times daily.   naloxone (NARCAN) nasal spray 4 mg/0.1 mL Place 1 spray into the nose as needed (opioid overdose).    ondansetron (ZOFRAN-ODT) 4 MG disintegrating tablet TAKE 1 TABLET (4 MG TOTAL) BY MOUTH EVERY 6 (SIX) HOURS  AS NEEDED FOR NAUSEA OR VOMITING.   oxymetazoline (AFRIN) 0.05 % nasal spray Place 1 spray into both nostrils at bedtime.   pentosan polysulfate (ELMIRON) 100 MG capsule Take 100 mg by mouth 2 (two) times daily.   potassium chloride SA (KLOR-CON M20) 20 MEQ tablet TAKE 1 TABLET TWICE DAILY FOR POTASSIUM (Patient taking differently: Take 20 mEq by mouth 2 (two) times daily. FOR POTASSIUM)   pravastatin (PRAVACHOL) 40 MG tablet Take 1 tablet at Bedtime for Cholesterol (Patient taking differently: Take 40 mg by mouth at bedtime.)   pregabalin (LYRICA) 150 MG capsule TAKE 1 CAPSULE BY MOUTH THREE TIMES A DAY   PROBIOTIC PRODUCT PO Take 1 capsule by mouth 2 (two) times daily. VSL   promethazine-dextromethorphan (PROMETHAZINE-DM) 6.25-15 MG/5ML syrup TAKE 5 MLS BY MOUTH 4 (FOUR) TIMES DAILY AS NEEDED FOR COUGH.   sertraline (ZOLOFT) 50 MG tablet Take 50 mg by mouth 2 (two) times daily.    sodium chloride (OCEAN) 0.65 % SOLN nasal spray Place 1 spray into both nostrils 4 (four) times daily as needed for congestion. Uses each time before other nasal sprays   tadalafil (CIALIS) 5 MG tablet Take 5 mg by mouth daily.    traMADol (ULTRAM) 50 MG tablet Take  1 tablet (50 mg total) by mouth every 6 (six) hours as needed. (Patient taking differently: Take 50 mg by mouth every 6 (six) hours as needed for moderate pain or severe pain.)   trospium (SANCTURA) 20 MG tablet Take 20 mg by mouth 2 (two) times daily. In the evening and at bedtime   TURMERIC PO Take 1,160 mg by mouth 3 (three) times daily.   zinc gluconate 50 MG tablet Take 50 mg by mouth 3 (three) times daily.   No current facility-administered medications on file prior to visit.    Allergies:  Allergies  Allergen Reactions   Bee Venom Swelling   Duloxetine Shortness Of Breath    Brought on asthma   Ppd [Tuberculin Purified Protein Derivative] Other (See Comments)    +ppd NEG Quantferron Gold 3/13 (shows false positive)    Fenofibrate Other (See Comments)    Back pain   Verapamil Other (See Comments)    Back pain   Claritin [Loratadine] Other (See Comments)    UNKNOWN    Levofloxacin Diarrhea   Other Diarrhea    "Some antibiotics" cause diarrhea     Medical History:  Past Medical History:  Diagnosis Date   Anesthesia complication requiring reversal agent administration    ? from central apnea, very difficult to get off vent   Anxiety    Arthritis    osteo   Asthma    BPH (benign prostatic hyperplasia)    Complication of anesthesia    difficulty waking , they twlight me because of my respiratory problems "   Depression    Dyspnea    on exertion   Enlarged heart    Family history of adverse reaction to anesthesia    mother trouble waking up, and heart stopped   GERD (gastroesophageal reflux disease)    Headache    botox injections for headaches   Hyperlipidemia    Hypertension    Hypogonadism male    IBS (irritable bowel syndrome)    Memory difficulties    short term memories   Neuropathy    Obesity    On home oxygen therapy    on 2 liter   OSA (obstructive sleep apnea)    not using cpap  Paralysis ([place])    left hand - small   Pneumonia    Pre-diabetes     Prostatitis     Family history- Reviewed and unchanged   Social history- Reviewed and unchanged   Names of Other Physician/Practitioners you currently use: 1. [facility] here for primary care 2. Eye Exam: DUE for 2020 3. Dental Exam DUE for 2020   Patient Care Team: [name], MD as PCP - General (Internal Medicine) [name] Pearletha Forge, MD as PCP - Cardiology (Cardiology) Elayne Snare, MD as Consulting Physician (Endocrinology) Tommy Medal, Lavell Islam, MD as Consulting Physician (Infectious Diseases) Bronson Ing, DPM as Consulting Physician (Podiatry)   Screening Tests: Immunization History  Administered Date(s) Administered   Influenza,inj,Quad PF,6+ Mos 03/04/2016, 03/09/2017, 05/19/2018   Influenza-Unspecified 02/24/2015, 05/19/2018, 01/24/2019   PPD Test 08/06/2011   Pneumococcal Conjugate-13 11/02/2014   Pneumococcal Polysaccharide-23 04/10/2016   Pneumococcal-Unspecified 05/26/2004   Td 05/26/2000   Tdap 08/01/2013   Zoster Recombinat (Shingrix) 11/05/2016, 03/14/2017   Zoster, Live 05/26/2009     Vaccinations: TD or Tdap: 2015  Influenza: 12/2018  Pneumococcal: 2017 Prevnar13: 2016 Shingles: Zostavax/Shingrix: 2018   Preventative Care: Last colonoscopy: 2007, 02/2019, 5years   Hep C screening (1945-1965): 07/2017   Imaging: Chest X-ray: EKG: ECHO:    Review of Systems:  Review of Systems  Constitutional:  Negative for chills, fever and weight loss.  HENT:  Negative for congestion and hearing loss.   Eyes:  Negative for blurred vision and double vision.  Respiratory:  Negative for cough and shortness of breath.   Cardiovascular:  Negative for chest pain, palpitations, orthopnea and leg swelling.  Gastrointestinal:  Negative for abdominal pain, constipation, diarrhea, heartburn, nausea and vomiting.  Musculoskeletal:  Negative for falls, joint pain and myalgias.  Skin:  Negative for rash.   Neurological:  Negative for dizziness, tingling, tremors, loss of consciousness and headaches.  Psychiatric/Behavioral:  Negative for depression, memory loss and suicidal ideas.      Physical Exam: There were no vitals taken for this visit. Wt Readings from Last 3 Encounters:  04/11/21 (!) 361 lb 3.2 oz (163.8 kg)  03/14/21 (!) 357 lb (161.9 kg)  01/09/21 (!) 356 lb (161.5 kg)   General Appearance: Well nourished, in no apparent distress. Eyes: PERRLA, EOMs, conjunctiva no swelling or erythema Sinuses: No Frontal/maxillary tenderness ENT/Mouth: Ext aud canals clear, TMs without erythema, bulging. No erythema, swelling, or exudate on post pharynx.  Tonsils not swollen or erythematous. Hearing normal.  Neck: Supple, thyroid normal.  Respiratory: Respiratory effort normal, BS equal bilaterally without rales, rhonchi, wheezing or stridor.  Cardio: RRR with no MRGs. Brisk peripheral pulses without edema.  Abdomen: Soft, obese, + BS.  Non tender, no guarding, rebound, hernias, masses. Musculoskeletal: Full ROM, 5/5 strength, Ambulates with walker cane Skin: Warm, dry without rashes, lesions, ecchymosis.  Neuro: Cranial nerves intact. No cerebellar symptoms.  Psych: Awake and oriented X 3, normal affect, Insight and Judgment appropriate.    Magda Bernheim, NP [facility], [facility] 1:17 PM

## 2021-04-16 DIAGNOSIS — J449 Chronic obstructive pulmonary disease, unspecified: Secondary | ICD-10-CM | POA: Diagnosis not present

## 2021-04-16 DIAGNOSIS — J9611 Chronic respiratory failure with hypoxia: Secondary | ICD-10-CM | POA: Diagnosis not present

## 2021-04-17 ENCOUNTER — Ambulatory Visit: Payer: BC Managed Care – PPO | Admitting: Nurse Practitioner

## 2021-04-17 DIAGNOSIS — R809 Proteinuria, unspecified: Secondary | ICD-10-CM | POA: Diagnosis not present

## 2021-04-17 DIAGNOSIS — E1169 Type 2 diabetes mellitus with other specified complication: Secondary | ICD-10-CM

## 2021-04-17 DIAGNOSIS — E559 Vitamin D deficiency, unspecified: Secondary | ICD-10-CM

## 2021-04-17 DIAGNOSIS — G4733 Obstructive sleep apnea (adult) (pediatric): Secondary | ICD-10-CM

## 2021-04-17 DIAGNOSIS — I129 Hypertensive chronic kidney disease with stage 1 through stage 4 chronic kidney disease, or unspecified chronic kidney disease: Secondary | ICD-10-CM | POA: Diagnosis not present

## 2021-04-17 DIAGNOSIS — I10 Essential (primary) hypertension: Secondary | ICD-10-CM

## 2021-04-17 DIAGNOSIS — N138 Other obstructive and reflux uropathy: Secondary | ICD-10-CM

## 2021-04-17 DIAGNOSIS — Z79899 Other long term (current) drug therapy: Secondary | ICD-10-CM

## 2021-04-17 DIAGNOSIS — E876 Hypokalemia: Secondary | ICD-10-CM | POA: Diagnosis not present

## 2021-04-17 DIAGNOSIS — G43009 Migraine without aura, not intractable, without status migrainosus: Secondary | ICD-10-CM

## 2021-04-17 DIAGNOSIS — E1122 Type 2 diabetes mellitus with diabetic chronic kidney disease: Secondary | ICD-10-CM

## 2021-04-17 DIAGNOSIS — J449 Chronic obstructive pulmonary disease, unspecified: Secondary | ICD-10-CM

## 2021-04-17 DIAGNOSIS — F3341 Major depressive disorder, recurrent, in partial remission: Secondary | ICD-10-CM

## 2021-04-23 ENCOUNTER — Other Ambulatory Visit: Payer: Self-pay

## 2021-04-23 ENCOUNTER — Encounter: Payer: Self-pay | Admitting: Podiatry

## 2021-04-23 ENCOUNTER — Ambulatory Visit: Payer: BC Managed Care – PPO | Admitting: Podiatry

## 2021-04-23 DIAGNOSIS — M79676 Pain in unspecified toe(s): Secondary | ICD-10-CM | POA: Diagnosis not present

## 2021-04-23 DIAGNOSIS — D2371 Other benign neoplasm of skin of right lower limb, including hip: Secondary | ICD-10-CM | POA: Diagnosis not present

## 2021-04-23 DIAGNOSIS — D2372 Other benign neoplasm of skin of left lower limb, including hip: Secondary | ICD-10-CM

## 2021-04-23 DIAGNOSIS — B351 Tinea unguium: Secondary | ICD-10-CM

## 2021-04-24 DIAGNOSIS — F3341 Major depressive disorder, recurrent, in partial remission: Secondary | ICD-10-CM | POA: Diagnosis not present

## 2021-04-24 DIAGNOSIS — R4189 Other symptoms and signs involving cognitive functions and awareness: Secondary | ICD-10-CM | POA: Diagnosis not present

## 2021-04-24 DIAGNOSIS — Z79899 Other long term (current) drug therapy: Secondary | ICD-10-CM | POA: Diagnosis not present

## 2021-04-24 NOTE — Progress Notes (Signed)
He presents today for follow-up of his painful elongated toenails.  States that the fasciitis is doing pretty well.  Objective: Vital signs are stable alert oriented x3 toenails are long thick yellow dystrophic onychomycotic multiple reactive hyperkeratotic lesions bilateral.  Pulses are strongly palpable no open lesions or wounds.  Assessment: Pain due to onychomycosis and benign skin lesions.  Plan: Debrided all benign skin lesions and debrided nails 1 through 5 bilateral.

## 2021-04-25 ENCOUNTER — Ambulatory Visit: Payer: BC Managed Care – PPO | Admitting: Nurse Practitioner

## 2021-05-01 ENCOUNTER — Other Ambulatory Visit: Payer: Self-pay | Admitting: Internal Medicine

## 2021-05-01 DIAGNOSIS — K648 Other hemorrhoids: Secondary | ICD-10-CM | POA: Diagnosis not present

## 2021-05-03 ENCOUNTER — Encounter: Payer: Self-pay | Admitting: Internal Medicine

## 2021-05-09 ENCOUNTER — Telehealth: Payer: Self-pay | Admitting: Physical Medicine and Rehabilitation

## 2021-05-09 NOTE — Telephone Encounter (Signed)
Patient called needing to schedule an appointment with Dr Ernestina Patches for his back    The number to contact patient is [phone]

## 2021-05-15 ENCOUNTER — Other Ambulatory Visit: Payer: Self-pay

## 2021-05-15 ENCOUNTER — Ambulatory Visit (INDEPENDENT_AMBULATORY_CARE_PROVIDER_SITE_OTHER): Payer: BC Managed Care – PPO | Admitting: Adult Health

## 2021-05-15 ENCOUNTER — Encounter: Payer: Self-pay | Admitting: Adult Health

## 2021-05-15 ENCOUNTER — Encounter: Payer: Self-pay | Admitting: Internal Medicine

## 2021-05-15 VITALS — BP 128/88 | HR 82 | Temp 98.2°F | Wt 355.0 lb

## 2021-05-15 DIAGNOSIS — G40219 Localization-related (focal) (partial) symptomatic epilepsy and epileptic syndromes with complex partial seizures, intractable, without status epilepticus: Secondary | ICD-10-CM

## 2021-05-15 DIAGNOSIS — G4733 Obstructive sleep apnea (adult) (pediatric): Secondary | ICD-10-CM

## 2021-05-15 DIAGNOSIS — M7989 Other specified soft tissue disorders: Secondary | ICD-10-CM

## 2021-05-15 DIAGNOSIS — J9611 Chronic respiratory failure with hypoxia: Secondary | ICD-10-CM

## 2021-05-15 DIAGNOSIS — E559 Vitamin D deficiency, unspecified: Secondary | ICD-10-CM

## 2021-05-15 DIAGNOSIS — Z79899 Other long term (current) drug therapy: Secondary | ICD-10-CM | POA: Diagnosis not present

## 2021-05-15 DIAGNOSIS — R7303 Prediabetes: Secondary | ICD-10-CM

## 2021-05-15 DIAGNOSIS — I10 Essential (primary) hypertension: Secondary | ICD-10-CM

## 2021-05-15 DIAGNOSIS — E782 Mixed hyperlipidemia: Secondary | ICD-10-CM

## 2021-05-15 DIAGNOSIS — J449 Chronic obstructive pulmonary disease, unspecified: Secondary | ICD-10-CM

## 2021-05-15 DIAGNOSIS — F3341 Major depressive disorder, recurrent, in partial remission: Secondary | ICD-10-CM

## 2021-05-15 DIAGNOSIS — F0150 Vascular dementia without behavioral disturbance: Secondary | ICD-10-CM | POA: Diagnosis not present

## 2021-05-15 DIAGNOSIS — Z6841 Body Mass Index (BMI) 40.0 and over, adult: Secondary | ICD-10-CM

## 2021-05-15 NOTE — Patient Instructions (Signed)
YOU CAN CALL TO MAKE AN ULTRASOUND.. ? ?I have put in an order for an ultrasound for you to have ?You can set them up at your convenience by calling this number  ?[phone] ?You will likely have the ultrasound at [address] ? ?If you have any issues call our office and we will set this up for you.   ?
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
## 2021-05-16 DIAGNOSIS — J449 Chronic obstructive pulmonary disease, unspecified: Secondary | ICD-10-CM | POA: Diagnosis not present

## 2021-05-16 DIAGNOSIS — J9611 Chronic respiratory failure with hypoxia: Secondary | ICD-10-CM | POA: Diagnosis not present

## 2021-05-16 LAB — CBC WITH DIFFERENTIAL/PLATELET
Absolute Monocytes: 965 cells/uL — ABNORMAL HIGH (ref 200–950)
Basophils Absolute: 82 cells/uL (ref 0–200)
Basophils Relative: 0.9 %
Eosinophils Absolute: 173 cells/uL (ref 15–500)
Eosinophils Relative: 1.9 %
HCT: 48.7 % (ref 38.5–50.0)
Hemoglobin: 16.3 g/dL (ref 13.2–17.1)
Lymphs Abs: 2857 cells/uL (ref 850–3900)
MCH: 29.4 pg (ref 27.0–33.0)
MCHC: 33.5 g/dL (ref 32.0–36.0)
MCV: 87.9 fL (ref 80.0–100.0)
MPV: 10.9 fL (ref 7.5–12.5)
Monocytes Relative: 10.6 %
Neutro Abs: 5023 cells/uL (ref 1500–7800)
Neutrophils Relative %: 55.2 %
Platelets: 286 10*3/uL (ref 140–400)
RBC: 5.54 10*6/uL (ref 4.20–5.80)
RDW: 13.1 % (ref 11.0–15.0)
Total Lymphocyte: 31.4 %
WBC: 9.1 10*3/uL (ref 3.8–10.8)

## 2021-05-16 LAB — COMPLETE METABOLIC PANEL WITH GFR
AG Ratio: 1.8 (calc) (ref 1.0–2.5)
ALT: 21 U/L (ref 9–46)
AST: 18 U/L (ref 10–35)
Albumin: 4.1 g/dL (ref 3.6–5.1)
Alkaline phosphatase (APISO): 61 U/L (ref 35–144)
BUN: 15 mg/dL (ref 7–25)
CO2: 31 mmol/L (ref 20–32)
Calcium: 10.3 mg/dL (ref 8.6–10.3)
Chloride: 103 mmol/L (ref 98–110)
Creat: 0.72 mg/dL (ref 0.70–1.35)
Globulin: 2.3 g/dL (calc) (ref 1.9–3.7)
Glucose, Bld: 92 mg/dL (ref 65–99)
Potassium: 4.1 mmol/L (ref 3.5–5.3)
Sodium: 141 mmol/L (ref 135–146)
Total Bilirubin: 1.3 mg/dL — ABNORMAL HIGH (ref 0.2–1.2)
Total Protein: 6.4 g/dL (ref 6.1–8.1)
eGFR: 105 mL/min/{1.73_m2} (ref >=60)

## 2021-05-16 LAB — LIPID PANEL
Cholesterol: 160 mg/dL (ref <200)
HDL: 29 mg/dL — ABNORMAL LOW (ref >=40)
LDL Cholesterol (Calc): 91 mg/dL (calc)
Non-HDL Cholesterol (Calc): 131 mg/dL (calc) — ABNORMAL HIGH (ref <130)
Total CHOL/HDL Ratio: 5.5 (calc) — ABNORMAL HIGH (ref <5.0)
Triglycerides: 298 mg/dL — ABNORMAL HIGH (ref <150)

## 2021-05-16 LAB — MAGNESIUM: Magnesium: 1.8 mg/dL (ref 1.5–2.5)

## 2021-05-16 LAB — TSH: TSH: 1.06 mIU/L (ref 0.40–4.50)

## 2021-05-22 ENCOUNTER — Ambulatory Visit
Admission: RE | Admit: 2021-05-22 | Discharge: 2021-05-22 | Disposition: A | Discharge status: Home / Self Care | Payer: BC Managed Care – PPO | Source: Ambulatory Visit | Attending: Adult Health | Admitting: Adult Health

## 2021-05-22 DIAGNOSIS — R222 Localized swelling, mass and lump, trunk: Secondary | ICD-10-CM | POA: Diagnosis not present

## 2021-05-22 DIAGNOSIS — M7989 Other specified soft tissue disorders: Secondary | ICD-10-CM

## 2021-05-24 ENCOUNTER — Encounter: Payer: Self-pay | Admitting: Adult Health

## 2021-05-26 DIAGNOSIS — I82409 Acute embolism and thrombosis of unspecified deep veins of unspecified lower extremity: Secondary | ICD-10-CM

## 2021-05-26 HISTORY — DX: Acute embolism and thrombosis of unspecified deep veins of unspecified lower extremity: I82.409

## 2021-05-28 ENCOUNTER — Other Ambulatory Visit: Payer: Self-pay | Admitting: Nurse Practitioner

## 2021-05-28 ENCOUNTER — Other Ambulatory Visit: Payer: Self-pay | Admitting: Adult Health

## 2021-05-29 ENCOUNTER — Ambulatory Visit (INDEPENDENT_AMBULATORY_CARE_PROVIDER_SITE_OTHER): Payer: BC Managed Care – PPO | Admitting: Physical Medicine and Rehabilitation

## 2021-05-29 ENCOUNTER — Other Ambulatory Visit: Payer: Self-pay

## 2021-05-29 ENCOUNTER — Encounter: Payer: Self-pay | Admitting: Physical Medicine and Rehabilitation

## 2021-05-29 VITALS — BP 128/87 | HR 111

## 2021-05-29 DIAGNOSIS — G8929 Other chronic pain: Secondary | ICD-10-CM

## 2021-05-29 DIAGNOSIS — M545 Low back pain, unspecified: Secondary | ICD-10-CM | POA: Diagnosis not present

## 2021-05-29 DIAGNOSIS — M47819 Spondylosis without myelopathy or radiculopathy, site unspecified: Secondary | ICD-10-CM | POA: Diagnosis not present

## 2021-05-29 DIAGNOSIS — M48061 Spinal stenosis, lumbar region without neurogenic claudication: Secondary | ICD-10-CM

## 2021-05-29 DIAGNOSIS — M47816 Spondylosis without myelopathy or radiculopathy, lumbar region: Secondary | ICD-10-CM | POA: Diagnosis not present
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
## 2021-05-29 NOTE — Progress Notes (Signed)
Pt state lower back pain that travels down his right leg. Pt state walking, standing and laying down makes the pain worse.Pt state neck pain that travels down his left arm. Pt state turning his head and bending over makes the pain worse. Pt state he takes pain meds to help ease his pain  Numeric Pain Rating Scale and Functional Assessment Average Pain 10 Pain Right Now 7 My pain is intermittent, constant, sharp, burning, dull, stabbing, tingling, and aching Pain is worse with: walking, bending, sitting, standing, some activites, and laying down Pain improves with: medication   In the last MONTH (on 0-10 scale) has pain interfered with the following?  1. General activity like being  able to carry out your everyday physical activities such as walking, climbing stairs, carrying groceries, or moving a chair?  Rating(7)  2. Relation with others like being able to carry out your usual social activities and roles such as  activities at home, at work and in your community. Rating(8)  3. Enjoyment of life such that you have  been bothered by emotional problems such as feeling anxious, depressed or irritable?  Rating(9)

## 2021-06-03 ENCOUNTER — Encounter: Payer: Self-pay | Admitting: Nurse Practitioner

## 2021-06-03 ENCOUNTER — Other Ambulatory Visit: Payer: Self-pay

## 2021-06-03 ENCOUNTER — Ambulatory Visit: Payer: BC Managed Care – PPO | Admitting: Nurse Practitioner

## 2021-06-03 DIAGNOSIS — U071 COVID-19: Secondary | ICD-10-CM

## 2021-06-03 MED ORDER — MOLNUPIRAVIR EUA 200MG CAPSULE: 4.0000 | ORAL_CAPSULE | Freq: Two times a day (BID) | ORAL | 0 refills | Status: AC

## 2021-06-03 MED ORDER — DEXAMETHASONE 2 MG PO TABS: ORAL_TABLET | ORAL | 0 refills | Status: DC

## 2021-06-03 MED ORDER — AZITHROMYCIN 250 MG PO TABS: ORAL_TABLET | ORAL | 1 refills | Status: DC

## 2021-06-03 MED ORDER — PROMETHAZINE-DM 6.25-15 MG/5ML PO SYRP: 5.0000 mL | ORAL_SOLUTION | Freq: Four times a day (QID) | ORAL | 1 refills | Status: DC | PRN

## 2021-06-03 NOTE — Progress Notes (Signed)
THIS ENCOUNTER IS A VIRTUAL VISIT DUE TO COVID-19 - PATIENT WAS NOT SEEN IN THE OFFICE.  PATIENT HAS CONSENTED TO VIRTUAL VISIT / TELEMEDICINE VISIT   Virtual Visit via telephone Note  I connected with  [name] on 06/03/2021 by telephone.  I verified that I am speaking with the correct person using two identifiers.    I discussed the limitations of evaluation and management by telemedicine and the availability of in person appointments. The patient expressed understanding and agreed to proceed.  History of Present Illness:  SpO2 95%  61 y.o. patient contacted office reporting URI sx . he tested positive by home test positive. OV was conducted by telephone to minimize exposure. This patient was vaccinated for covid 19, last 2021    Sx began 3 days ago with Nonproductive cough, headaches , fever/chills  Treatments tried so far: Tylenol  Exposures: mom   Medications  Current Outpatient Medications (Endocrine & Metabolic):    metFORMIN (GLUCOPHAGE-XR) 500 MG 24 hr tablet, Takes 2 tablets  2 x /day  with Meals  for Diabetes  Current Outpatient Medications (Cardiovascular):    bisoprolol-hydrochlorothiazide (ZIAC) 5-6.25 MG tablet, TAKE 1 TABLET DAILY FOR BLOOD PRESSURE (Patient taking differently: Take 1 tablet by mouth daily. FOR BLOOD PRESSURE)   EPINEPHrine 0.3 mg/0.3 mL IJ SOAJ injection, Inject 0.3 mg into the muscle as needed.   furosemide (LASIX) 80 MG tablet, TAKE 1 TO 1 & 1/2 TABLETS 2 X /DAY FOR FLUID RETENTION & SWELLING OF LEGS   pravastatin (PRAVACHOL) 40 MG tablet, TAKE 1 TABLET BY MOUTH AT BEDTIME FOR CHOLESTEROL   tadalafil (CIALIS) 5 MG tablet, Take 5 mg by mouth daily.   Current Outpatient Medications (Respiratory):    albuterol (VENTOLIN HFA) 108 (90 Base) MCG/ACT inhaler, Inhale 2 puffs into the lungs every 4 (four) hours as needed for wheezing or shortness of breath.   Azelastine HCl 137 MCG/SPRAY SOLN, Place 2 sprays into both nostrils 2 (two) times  daily. USE 1 TO 2 SPRAYS EACH NOSTRIL 2 X /DAY   cetirizine (ZYRTEC) 10 MG tablet, Take 1 tablet (10 mg total) by mouth 2 (two) times daily as needed for allergies (Can take an extra dose during flares).   fluticasone (FLONASE) 50 MCG/ACT nasal spray, Place 2 sprays into both nostrils daily. Use 1 to 2 sprays each Nostril 2 x /day   Fluticasone-Umeclidin-Vilant (TRELEGY ELLIPTA) 100-62.5-25 MCG/ACT AEPB, Inhale 1 puff into the lungs daily. (Patient taking differently: Inhale 1 puff into the lungs daily. Takes 200)   Guaifenesin 1200 MG TB12, Take 1,200 mg by mouth 2 (two) times daily.   ipratropium-albuterol (DUONEB) 0.5-2.5 (3) MG/3ML SOLN, Take 3 mLs by nebulization every 4 (four) hours as needed.   montelukast (SINGULAIR) 10 MG tablet, TAKE 1 TABLET BY MOUTH DAILY FOR ALLERGIES   oxymetazoline (AFRIN) 0.05 % nasal spray, Place 1 spray into both nostrils at bedtime.   sodium chloride (OCEAN) 0.65 % SOLN nasal spray, Place 1 spray into both nostrils 4 (four) times daily as needed for congestion. Uses each time before other nasal sprays   promethazine-dextromethorphan (PROMETHAZINE-DM) 6.25-15 MG/5ML syrup, TAKE 5 MLS BY MOUTH 4 (FOUR) TIMES DAILY AS NEEDED FOR COUGH. (Patient not taking: Reported on 06/03/2021)  Current Outpatient Medications (Analgesics):    acetaminophen (TYLENOL) 500 MG tablet, Take 1,000 mg by mouth every 6 (six) hours as needed for moderate pain.   celecoxib (CELEBREX) 200 MG capsule, TAKE 1 CAPSULE (200 MG TOTAL) BY MOUTH EVERY 12 (  TWELVE) HOURS.   Current Outpatient Medications (Other):    anastrozole (ARIMIDEX) 1 MG tablet, Take 1 mg by mouth daily.   ascorbic acid (VITAMIN C) 1000 MG tablet, Take 1,000 mg by mouth every evening.   Botulinum Toxin Type A 200 units SOLR, Inject 200 Units into the skin every 3 (three) months.   cetaphil (CETAPHIL) lotion, Apply topically 2 (two) times daily. (Patient taking differently: Apply 1 application topically 2 (two) times daily.)    Cholecalciferol (VITAMIN D3) 125 MCG (5000 UT) TABS, Take 5 tablets (25,000 Units total) by mouth See admin instructions. Take 15000 in the morning and 10,000 in the evening   Cinnamon 500 MG capsule, Take 1,000 mg by mouth 3 (three) times daily.   clotrimazole-betamethasone (LOTRISONE) cream, APPLY TO AFFECTED AREA TWICE A DAY (Patient taking differently: Apply 1 application topically 2 (two) times daily as needed (rash).)   Coenzyme Q10 (CO Q 10 PO), Take 600 mg by mouth daily.   cyclobenzaprine (FLEXERIL) 10 MG tablet, Take 10 mg by mouth 3 (three) times daily.   diclofenac Sodium (VOLTAREN) 1 % GEL, APPLY 2 TO 4 GRAMS TOPICALLY 2 TO 4 TIMES DAILY FOR PAIN & INFLAMMATION( OTC NOT COVERED)   donepezil (ARICEPT) 23 MG TABS tablet, Take 23 mg by mouth daily.   doxycycline (VIBRAMYCIN) 100 MG capsule, TAKE 1 CAPSULE BY MOUTH TWICE A DAY   dupilumab (DUPIXENT) 300 MG/2ML prefilled syringe, Inject 300 mg into the skin every 14 (fourteen) days.   Eluxadoline 100 MG TABS, Take 100 mg by mouth 2 (two) times daily.    Krill Oil 500 MG CAPS, Take 500 mg by mouth at bedtime.   Magnesium 500 MG CAPS, Take 500 mg by mouth daily with supper.   memantine (NAMENDA) 10 MG tablet, Take 10 mg by mouth 2 (two) times daily.   Misc Natural Products (GLUCOS-CHONDROIT-MSM COMPLEX) TABS, Take 2 tablets by mouth 3 (three) times daily.   Multiple Minerals-Vitamins (CITRACAL MAXIMUM PLUS) TABS, Take 1 tablet by mouth 2 (two) times daily. Vitamin D   Multiple Vitamins-Minerals (MULTIVITAMIN WITH MINERALS) tablet, Take 1 tablet by mouth daily.   Multiple Vitamins-Minerals (PRESERVISION AREDS 2) CAPS, Take 1 capsule by mouth 2 (two) times daily.   ondansetron (ZOFRAN-ODT) 4 MG disintegrating tablet, TAKE 1 TABLET (4 MG TOTAL) BY MOUTH EVERY 6 (SIX) HOURS AS NEEDED FOR NAUSEA OR VOMITING.   pentosan polysulfate (ELMIRON) 100 MG capsule, Take 100 mg by mouth 2 (two) times daily.   potassium chloride SA (KLOR-CON M20) 20 MEQ  tablet, TAKE 1 TABLET TWICE DAILY FOR POTASSIUM (Patient taking differently: Take 20 mEq by mouth 2 (two) times daily. FOR POTASSIUM)   pregabalin (LYRICA) 150 MG capsule, TAKE 1 CAPSULE BY MOUTH THREE TIMES A DAY   PROBIOTIC PRODUCT PO, Take 1 capsule by mouth 2 (two) times daily. VSL   sertraline (ZOLOFT) 50 MG tablet, Take 50 mg by mouth 2 (two) times daily.    trospium (SANCTURA) 20 MG tablet, Take 20 mg by mouth 2 (two) times daily. In the evening and at bedtime   TURMERIC PO, Take 1,160 mg by mouth 3 (three) times daily.   zinc gluconate 50 MG tablet, Take 50 mg by mouth 3 (three) times daily.   L-METHYLFOLATE-B6-B12 PO, Take 1 tablet by mouth daily. (Patient not taking: Reported on 06/03/2021)  Allergies:  Allergies  Allergen Reactions   Bee Venom Swelling   Duloxetine Shortness Of Breath    Brought on asthma   Ppd [Tuberculin  Purified Protein Derivative] Other (See Comments)    +ppd NEG Quantferron Gold 3/13 (shows false positive)    Fenofibrate Other (See Comments)    Back pain   Verapamil Other (See Comments)    Back pain   Claritin [Loratadine] Other (See Comments)    UNKNOWN    Levofloxacin Diarrhea   Other Diarrhea    "Some antibiotics" cause diarrhea    Problem list He has Hypertension; Hyperlipidemia, mixed; BPH (benign prostatic hyperplasia); Testosterone Deficiency; IBS (irritable bowel syndrome); Partial complex seizure disorder with intractable epilepsy ([place]); Depression, major, recurrent, in partial remission ([place]); Asthma-COPD overlap syndrome ([place]); Diverticula of colon; Positive TB test; Vitamin D deficiency; Prediabetes; GERD (gastroesophageal reflux disease); Medication management; SDAT; OSA and COPD overlap syndrome ([place]); Morbid obesity with BMI of 45.0-49.9, adult ([place]); Polypharmacy; Recurrent infections; Chronic nonseasonal allergic rhinitis due to pollen; Lumbar radiculopathy; Migraine without aura and without status migrainosus, not intractable;  Osteoarthritis of spine with radiculopathy, lumbar region; Risk for falls; Cervicalgia; Munchausen syndrome; Seasonal and perennial allergic rhinitis; Hymenoptera allergy; Chronic respiratory failure with hypoxia ([place]); Other spondylosis with radiculopathy, cervical region; Family history of colonic polyps; Memory difficulty; Myofascial pain; Morbid obesity ([place]); HNP (herniated nucleus pulposus) with myelopathy, cervical; Spinal stenosis of cervical region; Status post cervical spinal fusion; Tracheomalacia; Acquired tracheomalacia; Bilateral lower extremity edema; Status post cervical discectomy; and Herniation of cervical intervertebral disc with radiculopathy on their problem list.   Social History:   reports that he has quit smoking. His smoking use included cigarettes. He has a 1.50 pack-year smoking history. He has never used smokeless tobacco. He reports current alcohol use of about 1.0 standard drink per week. He reports that he does not use drugs.  Observations/Objective:  General : Well sounding patient in no apparent distress HEENT: no hoarseness, no cough for duration of visit Lungs: speaks in complete sentences, no audible wheezing, no apparent distress Neurological: alert, oriented x 3 Psychiatric: pleasant, judgement appropriate   Assessment and Plan: Livio was seen today for acute visit.  Diagnoses and all orders for this visit:  COVID -     dexamethasone (DECADRON) 2 MG tablet; 3 tabs x 3 days, 2 tabs for 3 days and then 1 tab a day x 5 days -     azithromycin (ZITHROMAX) 250 MG tablet; Take 2 tablets (500 mg) on  Day 1,  followed by 1 tablet (250 mg) once daily on Days 2 through 5. -     molnupiravir EUA (LAGEVRIO) 200 mg CAPS capsule; Take 4 capsules (800 mg total) by mouth 2 (two) times daily for 5 days. -     promethazine-dextromethorphan (PROMETHAZINE-DM) 6.25-15 MG/5ML syrup; Take 5 mLs by mouth 4 (four) times daily as needed for cough.    Covid 19 positive per rapid  screening test today at home Risk factors include: Hypertension, OSA with COPD, morbid obesity, Chronic respiratory failure with hypoxia, Asthma- COPD overlap syndrome Symptoms are: moderate Due to co morbid conditions and risk factors, discussed antivirals  Immue support reviewed  Take tylenol PRN temp 101+ Push hydration Regular ambulation or calf exercises exercises for clot prevention and 81 mg ASA unless contraindicated Sx supportive therapy suggested Follow up via mychart or telephone if needed Advised patient obtain O2 monitor; present to ED if persistently <90% or with severe dyspnea, CP, fever uncontrolled by tylenol, confusion, sudden decline Should remain in isolation until at least 5 days from onset of sx, 24-48 hours fever free without tylenol, sx such as cough are improved.  Follow Up Instructions:  I discussed the assessment and treatment plan with the patient. The patient was provided an opportunity to ask questions and all were answered. The patient agreed with the plan and demonstrated an understanding of the instructions.   The patient was advised to call back or seek an in-person evaluation if the symptoms worsen or if the condition fails to improve as anticipated.  I provided 20 minutes of non-face-to-face time during this encounter.   Magda Bernheim, NP

## 2021-06-04 DIAGNOSIS — G43719 Chronic migraine without aura, intractable, without status migrainosus: Secondary | ICD-10-CM | POA: Diagnosis not present

## 2021-06-06 ENCOUNTER — Other Ambulatory Visit: Payer: Self-pay | Admitting: Allergy & Immunology

## 2021-06-07 ENCOUNTER — Encounter: Payer: Self-pay | Admitting: Internal Medicine

## 2021-06-08 ENCOUNTER — Other Ambulatory Visit: Payer: Self-pay | Admitting: Internal Medicine

## 2021-06-08 MED ORDER — PROMETHAZINE-CODEINE 6.25-10 MG/5ML PO SYRP: ORAL_SOLUTION | ORAL | 1 refills | Status: DC

## 2021-06-08 MED ORDER — BENZONATATE 200 MG PO CAPS: ORAL_CAPSULE | ORAL | 1 refills | Status: DC

## 2021-06-13 ENCOUNTER — Encounter: Payer: Self-pay | Admitting: Internal Medicine

## 2021-06-16 DIAGNOSIS — J449 Chronic obstructive pulmonary disease, unspecified: Secondary | ICD-10-CM | POA: Diagnosis not present

## 2021-06-16 DIAGNOSIS — J9611 Chronic respiratory failure with hypoxia: Secondary | ICD-10-CM | POA: Diagnosis not present

## 2021-06-19 ENCOUNTER — Other Ambulatory Visit: Payer: Self-pay | Admitting: Internal Medicine

## 2021-06-19 ENCOUNTER — Encounter: Payer: Self-pay | Admitting: Internal Medicine

## 2021-06-19 DIAGNOSIS — E876 Hypokalemia: Secondary | ICD-10-CM

## 2021-06-19 DIAGNOSIS — U071 COVID-19: Secondary | ICD-10-CM

## 2021-06-19 MED ORDER — AZITHROMYCIN 250 MG PO TABS: ORAL_TABLET | ORAL | 0 refills | Status: DC

## 2021-06-21 ENCOUNTER — Telehealth: Payer: Self-pay | Admitting: Physical Medicine and Rehabilitation

## 2021-06-21 NOTE — Telephone Encounter (Signed)
Patient called advised he is just getting over Covid-19 virus, and a sinus injection. Patient asked if he need to reschedule his appointment? Patient said he is not feeling well. The number to contact patient is [phone]

## 2021-06-24 ENCOUNTER — Ambulatory Visit: Payer: BC Managed Care – PPO | Admitting: Physical Medicine and Rehabilitation

## 2021-06-27 ENCOUNTER — Other Ambulatory Visit: Payer: Self-pay | Admitting: Internal Medicine

## 2021-06-27 ENCOUNTER — Encounter: Payer: Self-pay | Admitting: Internal Medicine

## 2021-06-27 DIAGNOSIS — R059 Cough, unspecified: Secondary | ICD-10-CM

## 2021-06-27 DIAGNOSIS — U071 COVID-19: Secondary | ICD-10-CM

## 2021-06-27 MED ORDER — PROMETHAZINE-DM 6.25-15 MG/5ML PO SYRP: 5.0000 mL | ORAL_SOLUTION | Freq: Four times a day (QID) | ORAL | 1 refills | Status: DC | PRN

## 2021-06-27 MED ORDER — BENZONATATE 200 MG PO CAPS: ORAL_CAPSULE | ORAL | 1 refills | Status: DC

## 2021-07-03 ENCOUNTER — Other Ambulatory Visit: Payer: Self-pay | Admitting: Specialist

## 2021-07-04 DIAGNOSIS — G43719 Chronic migraine without aura, intractable, without status migrainosus: Secondary | ICD-10-CM | POA: Diagnosis not present

## 2021-07-09 ENCOUNTER — Ambulatory Visit: Payer: Self-pay

## 2021-07-09 ENCOUNTER — Encounter: Payer: Self-pay | Admitting: Physical Medicine and Rehabilitation

## 2021-07-09 ENCOUNTER — Ambulatory Visit (INDEPENDENT_AMBULATORY_CARE_PROVIDER_SITE_OTHER): Payer: BC Managed Care – PPO | Admitting: Physical Medicine and Rehabilitation

## 2021-07-09 ENCOUNTER — Other Ambulatory Visit: Payer: Self-pay

## 2021-07-09 VITALS — BP 167/103 | HR 96

## 2021-07-09 DIAGNOSIS — M47819 Spondylosis without myelopathy or radiculopathy, site unspecified: Secondary | ICD-10-CM

## 2021-07-09 MED ORDER — BUPIVACAINE HCL 0.5 % IJ SOLN
3.0000 mL | Freq: Once | INTRAMUSCULAR | Status: AC
  Administered 2021-07-09: 3 mL

## 2021-07-09 NOTE — Patient Instructions (Signed)

## 2021-07-09 NOTE — Progress Notes (Signed)
Pt state lower back pain that travels down his right leg. Pt state walking, standing and laying down makes the pain worse. Pt state he takes pain meds to help ease his pain  Numeric Pain Rating Scale and Functional Assessment Average Pain 7   In the last MONTH (on 0-10 scale) has pain interfered with the following?  1. General activity like being  able to carry out your everyday physical activities such as walking, climbing stairs, carrying groceries, or moving a chair?  Rating(9)   +Driver, -BT, -Dye Allergies.

## 2021-07-10 ENCOUNTER — Encounter: Payer: Self-pay | Admitting: Physical Medicine and Rehabilitation

## 2021-07-16 ENCOUNTER — Ambulatory Visit (INDEPENDENT_AMBULATORY_CARE_PROVIDER_SITE_OTHER): Payer: BC Managed Care – PPO | Admitting: Allergy & Immunology

## 2021-07-16 ENCOUNTER — Other Ambulatory Visit: Payer: Self-pay

## 2021-07-16 ENCOUNTER — Encounter: Payer: Self-pay | Admitting: Allergy & Immunology

## 2021-07-16 VITALS — BP 122/90 | HR 89 | Temp 99.2°F | Resp 16 | Ht 72.0 in | Wt 366.2 lb

## 2021-07-16 DIAGNOSIS — B999 Unspecified infectious disease: Secondary | ICD-10-CM

## 2021-07-16 DIAGNOSIS — U071 COVID-19: Secondary | ICD-10-CM | POA: Diagnosis not present

## 2021-07-16 DIAGNOSIS — J3089 Other allergic rhinitis: Secondary | ICD-10-CM

## 2021-07-16 DIAGNOSIS — J449 Chronic obstructive pulmonary disease, unspecified: Secondary | ICD-10-CM

## 2021-07-16 DIAGNOSIS — L2084 Intrinsic (allergic) eczema: Secondary | ICD-10-CM

## 2021-07-16 DIAGNOSIS — J302 Other seasonal allergic rhinitis: Secondary | ICD-10-CM

## 2021-07-16 MED ORDER — BUDESONIDE 0.5 MG/2ML IN SUSP: 0.5000 mg | Freq: Three times a day (TID) | RESPIRATORY_TRACT | 5 refills | Status: DC | PRN

## 2021-07-16 NOTE — Progress Notes (Signed)
FOLLOW UP  Date of Service/Encounter:  07/16/21   Assessment:   Perennial and seasonal allergic rhinitis (sweet vernal grass, box elder, cat, weeds, ragweed, molds, cockroach, dust mite)    Recurrent infections - doing well on prophylactic doxycycline today   Asthma-COPD overlap syndrome - on Trelegy, but wanting to restart Dupixent (AEC 300)   Hymenoptera allergy - EpiPen up to date   GERD - on PRN H2 blocker    Obesity - with resulting nerve impingement and joint pain   Chronic back pain - s/p recent neck surgery   Migraines - on botulinum injections   Polypharmacy  Plan/Recommendations:   1. 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 - Be sure to get the bivalent Omicron booster. - I do not think that immunoglobulin replacement is warranted at this time.   2. Asthma-COPD overlap syndrome - Lung testing looked worse today. - We are going to add on Pulmicort 0.5 mg mixed with albuterol nebulizer solution 3 times daily to see if this can help with your recovery (do this for TWO WEEKS and then decrease to twice daily for TWO WEEKS and then once daily for TWO WEEKS and then STOP).  - Daily controller medication(s): Trelegy 200/62.5/25 one puff once daily + Dupixent every two weeks. - Prior to physical activity: ProAir 2 puffs 10-15 minutes before physical activity. - Rescue medications: ProAir 4 puffs every 4-6 hours as needed or DuoNeb nebulizer one vial every 4-6 hours as needed - Asthma control goals:  * Full participation in all desired activities (may need albuterol before activity) * Albuterol use two time or less a week on average (not counting use with activity) * Cough interfering with sleep two time or less a month * Oral steroids no more than once a year * No hospitalizations   3. Chronic allergic rhinitis (sweet vernal grass, box elder, cat,  weeds, ragweed, molds, cockroach, dust mite) - Continue with aszelastine/fluticasone 2 sprays per nostril up to twice daily. - Continue with saline mist 1-2 times daily.  - Continue with cetirizine 10mg  daily as needed for breakthrough symptoms.  4. GERD - Continue famotidine as needed.  5. Return in about 4 months (around 11/13/2021).   Subjective:   Mark Harrell. is a 61 y.o. male presenting today for follow up of  Chief Complaint  Patient presents with   Follow-up    Breathing has been doing that good. COVID in January. Breathing is worse now. Dupixent is under review per patient.?   Allergic Rhinitis     The medication that was prescribed for him for Covid has helped with his allergies but when he tries to stop his allergies get worse so he goes back on it.     [name] has a history of the following: Patient Active Problem List   Diagnosis Date Noted   Herniation of cervical intervertebral disc with radiculopathy    Status post cervical discectomy 08/20/2020   Tracheomalacia 05/26/2019   Acquired tracheomalacia 05/26/2019   HNP (herniated nucleus pulposus) with myelopathy, cervical 05/23/2019    Class: Chronic   Spinal stenosis of cervical region 05/23/2019    Class: Chronic   Status post cervical spinal fusion 05/23/2019   Chronic respiratory failure with hypoxia ([place]) 04/12/2018   Bilateral lower extremity edema 12/29/2017   Hymenoptera allergy 11/10/2017   Family history of colonic  polyps 08/07/2017   Myofascial pain 08/06/2017   Seasonal and perennial allergic rhinitis 04/23/2017   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/2017   Cervicalgia 04/01/2017   Other spondylosis with radiculopathy, cervical region 02/09/2017   Memory difficulty 12/05/2016   Morbid obesity ([place]) 12/05/2016   Migraine without aura and without status migrainosus, not intractable 10/30/2016   Lumbar radiculopathy 05/15/2016   Osteoarthritis of spine with radiculopathy, lumbar region 05/15/2016    Risk for falls 05/15/2016   Recurrent infections 03/29/2016   Chronic nonseasonal allergic rhinitis due to pollen 03/29/2016   Polypharmacy 01/16/2016   Morbid obesity with BMI of 45.0-49.9, adult ([place]) 03/06/2015   SDAT 02/05/2015   OSA and COPD overlap syndrome ([place]) 02/05/2015   Medication management 08/02/2014   GERD (gastroesophageal reflux disease) 05/09/2014   Vitamin D deficiency 08/01/2013   Prediabetes 08/01/2013   Positive TB test 07/29/2011   Diverticula of colon 05/07/2011   Hypertension 01/31/2011   Hyperlipidemia, mixed 01/31/2011   BPH (benign prostatic hyperplasia) 01/31/2011   Testosterone Deficiency 01/31/2011   IBS (irritable bowel syndrome) 01/31/2011   Partial complex seizure disorder with intractable epilepsy ([place]) 01/31/2011   Depression, major, recurrent, in partial remission ([place]) 01/31/2011   Asthma-COPD overlap syndrome ([place]) 01/31/2011    History obtained from: chart review and patient.  Hridhaan is a 61 y.o. male presenting for a follow up visit.  He was last seen in November 2022.  At that time, we continue with his doxycycline twice daily as a prophylaxis for his specific antibody deficiency.  For his asthma COPD overlap, his lung testing looked better.  We continued with Trelegy but increase to the higher dose 200 mcg.  We also continue with Dupixent every 2 weeks.  For his allergic rhinitis, we continue with Dymista 2 sprays per nostril twice daily as well as cetirizine.  GERD was controlled with famotidine.  Since last visit, he contracted [place] in January. The whole family got it including his mother who is elderly. She cannot swallow pills. She did not go into the hospital. His sister did not want her to go to the hospital at all for treatment. His sister came back for a couple of days after leaving for [place].    He has been on back to back breathing treatments every 4 hours. He has been on the Tessalon pearls to control the coughing and the  Tylenol three times daily. If he stops he starts coughing and wheezing. He has not been sleeping well and overall he has not done too well. He did not get Paxlovid.   Asthma/Respiratory Symptom History: He is having some more issues due to his recent COVID19 infection. Prior to the infection, her breathing was actually under fair control. She is doing his Dupixent every two weeks. He has been using his Trelegy one puff once daily and staying on top of this. He has been using the rescue nebulizer routinely over the last few weeks. E has not been in the ED at all.   Allergic Rhinitis Symptom History: He remains on the Dymista two sprays per nostril once daily. He sometimes needs to increase it to twice daily. He is also on the cetirizine daily. He has not been on antibiotics at all for his symptoms.  However, he does remain on his doxycycline twice daily for specific antibody deficiency.  Otherwise, there have been no changes to his past medical history, surgical history, family history, or social history.    Review of Systems  Constitutional: Negative.  Negative  for chills, fever, malaise/fatigue and weight loss.  HENT: Negative.  Negative for congestion, ear discharge, ear pain and sinus pain.   Eyes:  Negative for pain, discharge and redness.  Respiratory:  Negative for cough, sputum production, shortness of breath and wheezing.   Cardiovascular: Negative.  Negative for chest pain and palpitations.  Gastrointestinal:  Negative for abdominal pain, constipation, diarrhea, heartburn, nausea and vomiting.  Skin: Negative.  Negative for itching and rash.  Neurological:  Negative for dizziness and headaches.  Endo/Heme/Allergies:  Negative for environmental allergies. Does not bruise/bleed easily.      Objective:   Blood pressure 122/90, pulse 89, temperature 99.2 F (37.3 C), temperature source Temporal, resp. rate 16, height 6' (1.829 m), weight (!) 366 lb 3.2 oz (166.1 kg), SpO2 92 %. Body  mass index is 49.67 kg/m.    Physical Exam Vitals reviewed.  Constitutional:      Appearance: He is well-developed. He is morbidly obese. He is ill-appearing. He is not toxic-appearing.  HENT:     Head: Normocephalic and atraumatic.     Right Ear: Tympanic membrane, ear canal and external ear normal.     Left Ear: Tympanic membrane, ear canal and external ear normal.     Nose: No nasal deformity, septal deviation, mucosal edema or rhinorrhea.     Right Turbinates: Enlarged, swollen and pale.     Left Turbinates: Enlarged, swollen and pale.     Right Sinus: No maxillary sinus tenderness or frontal sinus tenderness.     Left Sinus: No maxillary sinus tenderness or frontal sinus tenderness.     Mouth/Throat:     Mouth: Mucous membranes are not pale and not dry.     Pharynx: Uvula midline.  Eyes:     General: Lids are normal. Allergic shiner present.        Right eye: No discharge.        Left eye: No discharge.     Conjunctiva/sclera: Conjunctivae normal.     Right eye: Right conjunctiva is not injected. No chemosis.    Left eye: Left conjunctiva is not injected. No chemosis.    Pupils: Pupils are equal, round, and reactive to light.  Cardiovascular:     Rate and Rhythm: Normal rate and regular rhythm.     Heart sounds: Normal heart sounds.  Pulmonary:     Effort: Pulmonary effort is normal. No tachypnea, accessory muscle usage or respiratory distress.     Breath sounds: Normal breath sounds. No wheezing, rhonchi or rales.     Comments: Decreased air movement at the bases. Does not appear to be breathing as comfortably as previous exams.  Chest:     Chest wall: No tenderness.  Lymphadenopathy:     Cervical: No cervical adenopathy.  Skin:    Coloration: Skin is not pale.     Findings: No abrasion, erythema, petechiae or rash. Rash is not papular, urticarial or vesicular.  Neurological:     Mental Status: He is alert.  Psychiatric:        Behavior: Behavior is cooperative.      Diagnostic studies:    Spirometry: results abnormal (FEV1: 1.75/46%, FVC: 2.63/53%, FEV1/FVC: 67%).    Spirometry consistent with possible restrictive disease.    Allergy Studies: none        [name], MD  [facility]

## 2021-07-16 NOTE — Patient Instructions (Addendum)
1. 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 - Be sure to get the bivalent Omicron booster.  2. Asthma-COPD overlap syndrome - Lung testing looked worse today. - We are going to add on Pulmicort 0.5 mg mixed with albuterol nebulizer solution 3 times daily to see if this can help with your recovery (do this for TWO WEEKS and then decrease to twice daily for TWO WEEKS and then once daily for TWO WEEKS and then STOP).  - Daily controller medication(s): Trelegy 200/62.5/25 one puff once daily + Dupixent every two weeks. - Prior to physical activity: ProAir 2 puffs 10-15 minutes before physical activity. - Rescue medications: ProAir 4 puffs every 4-6 hours as needed or DuoNeb nebulizer one vial every 4-6 hours as needed - Asthma control goals:  * Full participation in all desired activities (may need albuterol before activity) * Albuterol use two time or less a week on average (not counting use with activity) * Cough interfering with sleep two time or less a month * Oral steroids no more than once a year * No hospitalizations   3. Chronic allergic rhinitis (sweet vernal grass, box elder, cat, weeds, ragweed, molds, cockroach, dust mite) - Continue with aszelastine/fluticasone 2 sprays per nostril up to twice daily. - Continue with saline mist 1-2 times daily.  - Continue with cetirizine 10mg  daily as needed for breakthrough symptoms.  4. GERD - Continue famotidine as needed.  5. Return in about 4 months (around 11/13/2021).     Please inform us of any Emergency Department visits, hospitalizations, or changes in symptoms. Call us before going to the ED for breathing or allergy symptoms since we might be able to fit you in for a sick visit. Feel free to contact us anytime with any questions, problems, or concerns.  It was a pleasure to see you again today!  Websites that have  reliable patient information: 1. American Academy of Asthma, Allergy, and Immunology: www.aaaai.org 2. Food Allergy Research and Education (FARE): foodallergy.org 3. Mothers of Asthmatics: http://www.asthmacommunitynetwork.org 4. American College of Allergy, Asthma, and Immunology: www.acaai.org   COVID-19 Vaccine Information can be found at: ShippingScam.co.uk For questions related to vaccine distribution or appointments, please email [email or web address] or call [phone].   We realize that you might be concerned about having an allergic reaction to the COVID19 vaccines. To help with that concern, WE ARE OFFERING THE COVID19 VACCINES IN OUR OFFICE! Ask the front desk for dates!     Like [place] on [employer] and Instagram for our latest updates!      A healthy democracy works best when [employer] participate! Make sure you are registered to vote! If you have moved or changed any of your contact information, you will need to get this updated before voting!  In some cases, you MAY be able to register to vote online: [email or web address]

## 2021-07-17 DIAGNOSIS — J9611 Chronic respiratory failure with hypoxia: Secondary | ICD-10-CM | POA: Diagnosis not present

## 2021-07-17 DIAGNOSIS — J449 Chronic obstructive pulmonary disease, unspecified: Secondary | ICD-10-CM | POA: Diagnosis not present

## 2021-07-18 ENCOUNTER — Encounter: Payer: Self-pay | Admitting: Allergy & Immunology

## 2021-07-18 ENCOUNTER — Other Ambulatory Visit: Payer: Self-pay | Admitting: Internal Medicine

## 2021-07-18 ENCOUNTER — Encounter: Payer: Self-pay | Admitting: Internal Medicine

## 2021-07-18 ENCOUNTER — Other Ambulatory Visit: Payer: Self-pay

## 2021-07-18 ENCOUNTER — Ambulatory Visit (INDEPENDENT_AMBULATORY_CARE_PROVIDER_SITE_OTHER): Payer: BC Managed Care – PPO | Admitting: Internal Medicine

## 2021-07-18 VITALS — BP 136/86 | HR 86 | Temp 97.8°F | Resp 18 | Ht 72.0 in | Wt 363.2 lb

## 2021-07-18 DIAGNOSIS — E1122 Type 2 diabetes mellitus with diabetic chronic kidney disease: Secondary | ICD-10-CM

## 2021-07-18 DIAGNOSIS — G4733 Obstructive sleep apnea (adult) (pediatric): Secondary | ICD-10-CM

## 2021-07-18 DIAGNOSIS — I10 Essential (primary) hypertension: Secondary | ICD-10-CM | POA: Diagnosis not present

## 2021-07-18 DIAGNOSIS — E559 Vitamin D deficiency, unspecified: Secondary | ICD-10-CM

## 2021-07-18 DIAGNOSIS — E1169 Type 2 diabetes mellitus with other specified complication: Secondary | ICD-10-CM

## 2021-07-18 DIAGNOSIS — J449 Chronic obstructive pulmonary disease, unspecified: Secondary | ICD-10-CM

## 2021-07-18 DIAGNOSIS — N181 Chronic kidney disease, stage 1: Secondary | ICD-10-CM

## 2021-07-18 DIAGNOSIS — Z6841 Body Mass Index (BMI) 40.0 and over, adult: Secondary | ICD-10-CM

## 2021-07-18 DIAGNOSIS — E785 Hyperlipidemia, unspecified: Secondary | ICD-10-CM | POA: Diagnosis not present

## 2021-07-18 DIAGNOSIS — Z79899 Other long term (current) drug therapy: Secondary | ICD-10-CM

## 2021-07-18 LAB — COMPLETE METABOLIC PANEL WITH GFR
AG Ratio: 1.9 (calc) (ref 1.0–2.5)
ALT: 23 U/L (ref 9–46)
AST: 19 U/L (ref 10–35)
Albumin: 4.1 g/dL (ref 3.6–5.1)
Alkaline phosphatase (APISO): 61 U/L (ref 35–144)
BUN: 16 mg/dL (ref 7–25)
CO2: 31 mmol/L (ref 20–32)
Calcium: 9.9 mg/dL (ref 8.6–10.3)
Chloride: 104 mmol/L (ref 98–110)
Creat: 0.81 mg/dL (ref 0.70–1.35)
Globulin: 2.2 g/dL (calc) (ref 1.9–3.7)
Glucose, Bld: 100 mg/dL — ABNORMAL HIGH (ref 65–99)
Potassium: 4.6 mmol/L (ref 3.5–5.3)
Sodium: 142 mmol/L (ref 135–146)
Total Bilirubin: 1.2 mg/dL (ref 0.2–1.2)
Total Protein: 6.3 g/dL (ref 6.1–8.1)
eGFR: 101 mL/min/{1.73_m2} (ref >=60)

## 2021-07-18 LAB — CBC WITH DIFFERENTIAL/PLATELET
Absolute Monocytes: 875 cells/uL (ref 200–950)
Basophils Absolute: 89 cells/uL (ref 0–200)
Basophils Relative: 1.1 %
Eosinophils Absolute: 162 cells/uL (ref 15–500)
Eosinophils Relative: 2 %
HCT: 47.6 % (ref 38.5–50.0)
Hemoglobin: 15.6 g/dL (ref 13.2–17.1)
Lymphs Abs: 2924 cells/uL (ref 850–3900)
MCH: 28.8 pg (ref 27.0–33.0)
MCHC: 32.8 g/dL (ref 32.0–36.0)
MCV: 88 fL (ref 80.0–100.0)
MPV: 11.2 fL (ref 7.5–12.5)
Monocytes Relative: 10.8 %
Neutro Abs: 4050 cells/uL (ref 1500–7800)
Neutrophils Relative %: 50 %
Platelets: 274 10*3/uL (ref 140–400)
RBC: 5.41 10*6/uL (ref 4.20–5.80)
RDW: 13.3 % (ref 11.0–15.0)
Total Lymphocyte: 36.1 %
WBC: 8.1 10*3/uL (ref 3.8–10.8)

## 2021-07-18 MED ORDER — TRULICITY 1.5 MG/0.5ML ~~LOC~~ SOAJ: SUBCUTANEOUS | 0 refills | Status: DC

## 2021-07-18 MED ORDER — TIRZEPATIDE 5 MG/0.5ML ~~LOC~~ SOAJ: SUBCUTANEOUS | 0 refills | Status: DC

## 2021-07-18 MED ORDER — OZEMPIC (1 MG/DOSE) 4 MG/3ML ~~LOC~~ SOPN: PEN_INJECTOR | SUBCUTANEOUS | 0 refills | Status: DC

## 2021-07-18 NOTE — Progress Notes (Addendum)
Future Appointments  Date Time Provider Department  07/18/2021  3:30 PM [name], MD [facility]  08/05/2021  3:00 PM Magnus Sinning, MD [facility]  11/12/2021  3:45 PM [name], MD [facility]  01/09/2022  3:00 PM [name], MD [facility]    History of Present Illness:       This very nice 61 y.o. single WM presents for 6 month follow up with HTN, HLD, Morbid Obesity  (BMI 46+),  T2_NIDDM, Polypharmacy,  Low T, Allergic Asthma  /Restrictive Lung Disease and Vitamin D Deficiency.       Other problems include OSA w/mask Intolerance, hx/o Migraine HA's, hx/o TBI & cognitive impairment. 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       Patient is treated for HTN (2004) & BP has been controlled.  Todays BP was initially elevated & rechecked at goal  -  136/86. Patient has had no complaints of any cardiac type chest pain, palpitations, dyspnea Vertell Limber /PND, dizziness, claudication, or dependent edema.       Hyperlipidemia is controlled with diet & Pravastatin. Patient denies myalgias or other med SEs. Last Lipids were at goal except elevated Trig's :  Lab Results  Component Value Date   CHOL 160 05/15/2021   HDL 29 (L) 05/15/2021   LDLCALC 91 05/15/2021   TRIG 298 (H) 05/15/2021   CHOLHDL 5.5 (H) 05/15/2021     Also, the patient has history of T2_NIDDM (2012) and has had no symptoms of reactive hypoglycemia, diabetic polys, paresthesias or visual blurring.  Patient had been on Metformin in the past and had tapered off. Last A1c was at goal :  Lab Results  Component Value Date   HGBA1C 5.5 01/09/2021                                                          Further, the patient also has history of Vitamin D Deficiency ("27" /2008) and supplements vitamin D without any suspected side-effects. Last vitamin D was slightly elevated :   Lab Results  Component  Value Date   VD25OH 110 (H) 01/09/2021     Current Outpatient Medications on File Prior to Visit  Medication Sig   acetaminophen  500 MG tablet Take 1,000 mg  every 6  hours as needed for moderate pain.   albuterol HFA  inhaler INHALE 2 PUFFS  EVERY 4 HOURS AS NEEDED    anastrozole (ARIMIDEX) 1 MG tablet Take daily.   VITAMIN C 1000 MG tablet Take very evening.   Azelastine HCl 137 MCG/SPRAY SOLN Place 2 sprays into both nostrils 2 times daily.    benzonatate  200 MG capsule Take 1 perle 3 x /day to prevent cough   bisoprolol-hctz  5-6.25 MG tablet TAKE 1 TABLET  EVERY DAY    Botulinum Toxin Type A 200 units SOLR Inject 200 Units into the skin every 3 months.   budesonide (PULMICORT) 0.5 MG/2ML nebulizer solution Take 2 mLs by neb 3  times daily as needed.   celecoxib 200 MG capsule TAKE 1 CAPSULE  EVERY 12 HOURS.   cetaphil  lotion Apply topically 2 times daily.    cetirizine  10 MG tablet Take 1 tablet 2  times  daily as needed for allergies    VITAMIN D  5,000 u Take 5 tablets (25,000 Units total)  Take 15000 in the morning and 10,000 in the evening   Cinnamon 500 MG capsule Take 1,000 mg 3  times daily.   clotrimazole-betamethasone cream APPLY TO AFFECTED AREA TWICE A DAY    Coenzyme Q10 600 mg  Take daily.   cyclobenzaprine 10 MG tablet Take 3 times daily.   diclofenac 1 % GEL APPLY 2 TO 4 GRAMS TOPICALLY 2 TO 4 TIMES DAILY FOR PAIN & INFLAMMATION   donepezil 23 MG TABS  Take 23 mg daily.   doxycycline 100 MG capsule TAKE 1 CAPSULE TWICE A DAY   dupilumab (DUPIXENT) 300 MG/2ML prefilled syringe Inject 300 mg into the skin every 14 days.   Eluxadoline 100 MG TABS Take 2  times daily.    EPINEPHrine 0.3 mg/0.3 mL  Inject 0.3 mg into the muscle as needed.   FLONASE  nasal spray Place 2 sprays into both nostrils daily. Use 1 to 2 sprays each Nostril 2 x /day   TRELEGY ELLIPTA  100-62.5-25  Inhale 1 puff into the lungs daily   furosemide 80 MG tablet TAKE 1 TO 1 & 1/2 TABLETS 2 X /DAY     Guaifenesin 1200 MG TB12 Take 2 times daily.   ipratropium-albuterol (DUONEB) 0.5-2.5 (3) MG/3ML SOLN Take 3 mLs by neb every 4  hours as needed.   Krill Oil 500 MG CAPS Take  at bedtime.   Magnesium 500 MG CAPS Take daily with supper.   memantine 10 MG tablet Take 2 times daily.   metFORMIN -XR 500 MG  Takes 2 tablets  2 x /day  with Meals    GLUCOS-CHONDROIT-MSM  Take 2 tablets  3  times daily.   montelukast 10 MG tablet TAKE 1 TABLET  DAILY FOR ALLERGIES   CITRACAL MAXIMUM PLUS  Take 1 tablet  2 times daily   Multiple Vitamins  Minerals Take 1 tablet  daily.   PRESERVISION AREDS 2 Take 1 capsule  2 times daily.   ondansetron-ODT 4 MG  TAKE 1 TABLET EVERY 6  HOURS AS NEEDED FOR NAUSEA    oxymetazoline  0.05 % nasal spray Place 1 spray into both nostrils at bedtime.   ELMIRON 100 MG capsule Take 2  times daily.   potassium chloride 20 MEQ  TAKE 1 TABLET TWICE DAILY FOR POTASSIUM   pravastatin 40 MG tablet TAKE 1 TABLET  AT BEDTIME    pregabalin (LYRICA) 150 MG capsule TAKE 1 CAPSULE THREE TIMES A DAY   PROBIOTIC \ Take 1 capsule 2 times daily. VSL   promethazine-codeine  6.25-10 MG/5ML syrup Take 1 or 2 teaspoonful  4 x /day as needed for    promethazine-DM 6.25-15 MG/5ML  Take 5 mLs  4  times daily as needed for cough.   sertraline  50 MG tablet Take  2  times daily.    sodium chloride  0.65 % SOLN nasal spray Place 1 spray into both nostrils 4 times daily    tadalafil  5 MG tablet Take 5 mg  daily.    trospium  20 MG tablet Take 20 mg 2  times daily   TURMERIC  Take 1,160 mg 3  times daily.   zinc 50 MG tablet Take 50 mg  3  times daily.    Allergies  Allergen Reactions   Bee Venom Swelling   Duloxetine Shortness Of Breath    Brought on  asthma   Ppd [Tuberculin Purified Protein Derivative] Other (See Comments)    +ppd NEG Quantferron Gold 3/13 (shows false positive)    Fenofibrate Other (See Comments)    Back pain   Verapamil Other (See Comments)    Back pain   Claritin  [Loratadine] Other (See Comments)    UNKNOWN    Levofloxacin Diarrhea   Other Diarrhea    "Some antibiotics" cause diarrhea     PMHx:   Past Medical History:  Diagnosis Date   Anesthesia complication requiring reversal agent administration    ? from central apnea, very difficult to get off vent   Anxiety    Arthritis    osteo   Asthma    BPH (benign prostatic hyperplasia)    Complication of anesthesia    difficulty waking , they twlight me because of my respiratory problems "   Depression    Dyspnea    on exertion   Enlarged heart    Family history of adverse reaction to anesthesia    mother trouble waking up, and heart stopped   GERD (gastroesophageal reflux disease)    Headache    botox injections for headaches   Hyperlipidemia    Hypertension    Hypogonadism male    IBS (irritable bowel syndrome)    Memory difficulties    short term memories   Neuropathy    Obesity    On home oxygen therapy    on 2 liter   OSA (obstructive sleep apnea)    not using cpap   Paralysis (HCC)    left hand - small   Pneumonia    Pre-diabetes    Prostatitis      Immunization History  Administered Date(s) Administered   Influenza,inj,Quad  03/04/2016, 03/09/2017, 05/19/2018   Influenza- 05/19/2018, 01/24/2019, 04/10/2021   PPD Test 08/06/2011   Pneumococcal -13 11/02/2014   Pneumococcal -23 04/10/2016   Pneumococcal-23 05/26/2004   Td 05/26/2000   Tdap 08/01/2013   Zoster Recombinat (Shingrix) 11/05/2016, 03/14/2017   Zoster, Live 05/26/2009     Past Surgical History:  Procedure Laterality Date   ABDOMINAL SURGERY     ANKLE FRACTURE SURGERY Right    ANTERIOR CERVICAL DECOMP/DISCECTOMY FUSION N/A 05/23/2019   Procedure: ANTERIOR CERVICAL DISCECTOMY FUSION CERVICAL FIVE THROUGH CERVICAL SIX AND CERVICAL SIX THROUGH CERVICAL SEVEN;  Surgeon: Jessy Oto, MD;  Location: [facility];  Service: Orthopedics;  Laterality: N/A;   COLONOSCOPY     CYSTOSCOPY     [place]   KNEE  ARTHROSCOPY WITH MEDIAL MENISECTOMY Left 01/02/2017   Procedure: LEFT KNEE ARTHROSCOPY WITH PARTIAL MEDIAL MENISCECTOMY;  Surgeon: Mcarthur Rossetti, MD;  Location: [facility];  Service: Orthopedics;  Laterality: Left;   POSTERIOR CERVICAL FUSION/FORAMINOTOMY N/A 08/20/2020   Procedure: LEFT CERVICAL SIX THROUGH SEVEN  AND CERVICAL SEVEN THROUGH THORACIC ONE  FORAMINOTOMIES WITH EXCISION OF [place] HERNIATION LEFT CERVICAL SEVEN THROUGH THORACIC ONE;  Surgeon: Jessy Oto, MD;  Location: [facility];  Service: Orthopedics;  Laterality: N/A;   TONSILLECTOMY     TURBINATE RESECTION  2007   UVULOPALATOPHARYNGOPLASTY      FHx:    Reviewed / unchanged  SHx:    Reviewed / unchanged   Systems Review:  Constitutional: Denies fever, chills, wt changes, headaches, insomnia, fatigue, night sweats, change in appetite. Eyes: Denies redness, blurred vision, diplopia, discharge, itchy, watery eyes.  ENT: Denies discharge, congestion, post nasal drip, epistaxis, sore throat, earache, hearing loss, dental pain, tinnitus, vertigo, sinus pain, snoring.  CV: Denies chest pain, palpitations, irregular heartbeat, syncope, dyspnea, diaphoresis, orthopnea, PND, claudication or edema. Respiratory: denies cough, dyspnea, DOE, pleurisy, hoarseness, laryngitis, wheezing.  Gastrointestinal: Denies dysphagia, odynophagia, heartburn, reflux, water brash, abdominal pain or cramps, nausea, vomiting, bloating, diarrhea, constipation, hematemesis, melena, hematochezia  or hemorrhoids. Genitourinary: Denies dysuria, frequency, urgency, nocturia, hesitancy, discharge, hematuria or flank pain. Musculoskeletal: Denies arthralgias, myalgias, stiffness, jt. swelling, pain, limping or strain/sprain.  Skin: Denies pruritus, rash, hives, warts, acne, eczema or change in skin lesion(s). Neuro: No weakness, tremor, incoordination, spasms, paresthesia or pain. Psychiatric: Denies confusion, memory loss or sensory loss. Endo: Denies change in  weight, skin or hair change.  Heme/Lymph: No excessive bleeding, bruising or enlarged lymph nodes.  Physical Exam  BP 136/86    Pulse 86    Temp 97.8 F (36.6 C)    Resp 18    Ht 6' (1.829 m)    Wt (!) 363 lb 3.2 oz (164.7 kg)    SpO2 95%    BMI 49.26 kg/m   Appears  well nourished, well groomed  and in no distress.  Eyes: PERRLA, EOMs, conjunctiva no swelling or erythema. Sinuses: No frontal/maxillary tenderness ENT/Mouth: EAC's clear, TM's nl w/o erythema, bulging. Nares clear w/o erythema, swelling, exudates. Oropharynx clear without erythema or exudates. Oral hygiene is good. Tongue normal, non obstructing. Hearing intact.  Neck: Supple. Thyroid not palpable. Car 2+/2+ without bruits, nodes or JVD. Chest: Respirations nl with BS clear & equal w/o rales, rhonchi, wheezing or stridor.  Cor: Heart sounds normal w/ regular rate and rhythm without sig. murmurs, gallops, clicks or rubs. Peripheral pulses normal and equal  without edema.  Abdomen: Soft & bowel sounds normal. Non-tender w/o guarding, rebound, hernias, masses or organomegaly.  Lymphatics: Unremarkable.  Musculoskeletal: Full ROM all peripheral extremities, joint stability, 5/5 strength and normal gait.  Skin: Warm, dry without exposed rashes, lesions or ecchymosis apparent.  Neuro: Cranial nerves intact, reflexes equal bilaterally. Sensory-motor testing grossly intact. Tendon reflexes grossly intact.  Pysch: Alert & oriented x 3.  Insight and judgement nl & appropriate. No ideations.  Assessment and Plan:  1. Essential hypertension  - Continue medication, monitor blood pressure at home.  - Continue DASH diet.  Reminder to go to the ER if any CP,  SOB, nausea, dizziness, severe HA, changes vision/speech.   - CBC with Differential/Platelet - COMPLETE METABOLIC PANEL WITH GFR  2. Hyperlipidemia associated with type 2 diabetes mellitus ([place])  - Continue diet/meds, exercise,& lifestyle modifications.  - Continue monitor  periodic cholesterol/liver & renal functions  3. Type 2 diabetes mellitus with stage 1 chronic kidney  disease, without long-term current use of insulin (HCC)  DECLINED BY HIS INS CO- REPLACED WITH OZEMPIC  - Continue diet, exercise  - Lifestyle modifications.  - Monitor appropriate labs   4. Vitamin D deficiency  - Continue supplementation  5. OSA and COPD overlap syndrome ([place])   6. Morbid obesity with BMI of 45.0-49.9, adult ([place])   DECLINED BY HIS INS CO- REPLACED WITH OZEMPIC  7. Polypharmacy  8. Medication management  - CBC with Differential/Platelet - COMPLETE METABOLIC PANEL WITH GFR         Discussed  regular exercise, BP monitoring, weight control to achieve/maintain BMI less than 25 and discussed med and SE's. Recommended labs to assess and monitor clinical status with further disposition pending results of labs.  I discussed the assessment and treatment plan with the patient. The patient was provided an opportunity to ask questions and all were answered. The patient agreed with the plan and demonstrated an understanding of the instructions.  I provided over 30 minutes of exam, counseling, chart review and  complex critical decision making.        The patient was advised to call back or seek an in-person evaluation if the symptoms worsen or if the condition fails to improve as anticipated.   Kirtland Bouchard, MD

## 2021-07-18 NOTE — Patient Instructions (Signed)

## 2021-07-19 NOTE — Progress Notes (Signed)
=============================================================== °-   Test results slightly outside the reference range are not unusual. If there is anything important, I will review this with you,  otherwise it is considered normal test values.  If you have further questions,  please do not hesitate to contact me at the office or via My Chart.  =============================================================== ===============================================================  -  CBC,          Glucose,                kidney functions,                                   electrolytes &                                               Liver enzymes                                                              - all normal & OK   =============================================================== ===============================================================

## 2021-07-21 NOTE — Procedures (Signed)
Lumbar Diagnostic Facet Joint Nerve Block with Fluoroscopic Guidance   Patient: Mark Harrell.      Date of Birth: Jan 02, 1961 MRN: [id number] PCP: [name], MD      Visit Date: 07/09/2021   Universal Protocol:    Date/Time: 02/26/237:51 PM  Consent Given By: the patient  Position: PRONE  Additional Comments: Vital signs were monitored before and after the procedure. Patient was prepped and draped in the usual sterile fashion. The correct patient, procedure, and site was verified.   Injection Procedure Details:   Procedure diagnoses:  1. Spondylosis without myelopathy or radiculopathy      Meds Administered:  Meds ordered this encounter  Medications   bupivacaine (MARCAINE) 0.5 % (with pres) injection 3 mL     Laterality: Bilateral  Location/Site: L4-L5, L3 and L4 medial branches and L5-S1, L4 medial branch and L5 dorsal ramus  Needle: 5.0 in., 25 ga.  Short bevel or Quincke spinal needle  Needle Placement: Oblique pedical  Findings:   -Comments: There was excellent flow of contrast along the articular pillars without intravascular flow.  Procedure Details: The fluoroscope beam is vertically oriented in AP and then obliqued 15 to 20 degrees to the ipsilateral side of the desired nerve to achieve the Scotty dog appearance.  The skin over the target area of the junction of the superior articulating process and the transverse process (sacral ala if blocking the L5 dorsal rami) was locally anesthetized with a 1 ml volume of 1% Lidocaine without Epinephrine.  The spinal needle was inserted and advanced in a trajectory view down to the target.   After contact with periosteum and negative aspirate for blood and CSF, correct placement without intravascular or epidural spread was confirmed by injecting 0.5 ml. of Isovue-250.  A spot radiograph was obtained of this image.    Next, a 0.5 ml. volume of the injectate described above was injected. The needle was then  redirected to the other facet joint nerves mentioned above if needed.  Prior to the procedure, the patient was given a Pain Diary which was completed for baseline measurements.  After the procedure, the patient rated their pain every 30 minutes and will continue rating at this frequency for a total of 5 hours.  The patient has been asked to complete the Diary and return to [place] by mail, fax or hand delivered as soon as possible.   Additional Comments:  No complications occurred Dressing: 2 x 2 sterile gauze and Band-Aid    Post-procedure details: Patient was observed during the procedure. Post-procedure instructions were reviewed.  Patient left the clinic in stable condition.

## 2021-07-21 NOTE — Progress Notes (Signed)
Mark Harrell. - 61 y.o. male MRN [id number]  Date of birth: [birth year]  Office Visit Note: Visit Date: 07/09/2021 PCP: [name], MD Referred by: [name], MD  Subjective: Chief Complaint  Patient presents with   Lower Back - Pain   Right Leg - Pain   HPI:  Mark Drab. is a 60 y.o. male who comes in today at the request of Barnet Pall, FNP for planned Bilateral  L4-5 and L5-S1 Lumbar facet/medial branch block with fluoroscopic guidance.  The patient has failed conservative care including home exercise, medications, time and activity modification.  This injection will be diagnostic and hopefully therapeutic.  Please see requesting physician notes for further details and justification.  Exam has shown concordant pain with facet joint loading.   ROS Otherwise per HPI.  Assessment & Plan: Visit Diagnoses:    ICD-10-CM   1. Spondylosis without myelopathy or radiculopathy  M47.819 XR C-ARM NO REPORT    Facet Injection    bupivacaine (MARCAINE) 0.5 % (with pres) injection 3 mL      Plan: No additional findings.   Meds & Orders:  Meds ordered this encounter  Medications   bupivacaine (MARCAINE) 0.5 % (with pres) injection 3 mL    Orders Placed This Encounter  Procedures   Facet Injection   XR C-ARM NO REPORT    Follow-up: Return for Review Pain Diary.   Procedures: No procedures performed  Lumbar Diagnostic Facet Joint Nerve Block with Fluoroscopic Guidance   Patient: Mark Harrell.      Date of Birth: 06-18-1960 MRN: [id number] PCP: [name], MD      Visit Date: 07/09/2021   Universal Protocol:    Date/Time: 02/26/237:51 PM  Consent Given By: the patient  Position: PRONE  Additional Comments: Vital signs were monitored before and after the procedure. Patient was prepped and draped in the usual sterile fashion. The correct patient, procedure, and site was verified.   Injection Procedure Details:   Procedure diagnoses:  1.  Spondylosis without myelopathy or radiculopathy      Meds Administered:  Meds ordered this encounter  Medications   bupivacaine (MARCAINE) 0.5 % (with pres) injection 3 mL     Laterality: Bilateral  Location/Site: L4-L5, L3 and L4 medial branches and L5-S1, L4 medial branch and L5 dorsal ramus  Needle: 5.0 in., 25 ga.  Short bevel or Quincke spinal needle  Needle Placement: Oblique pedical  Findings:   -Comments: There was excellent flow of contrast along the articular pillars without intravascular flow.  Procedure Details: The fluoroscope beam is vertically oriented in AP and then obliqued 15 to 20 degrees to the ipsilateral side of the desired nerve to achieve the Scotty dog appearance.  The skin over the target area of the junction of the superior articulating process and the transverse process (sacral ala if blocking the L5 dorsal rami) was locally anesthetized with a 1 ml volume of 1% Lidocaine without Epinephrine.  The spinal needle was inserted and advanced in a trajectory view down to the target.   After contact with periosteum and negative aspirate for blood and CSF, correct placement without intravascular or epidural spread was confirmed by injecting 0.5 ml. of Isovue-250.  A spot radiograph was obtained of this image.    Next, a 0.5 ml. volume of the injectate described above was injected. The needle was then redirected to the other facet joint nerves mentioned above if needed.  Prior to the procedure, the patient  was given a Pain Diary which was completed for baseline measurements.  After the procedure, the patient rated their pain every 30 minutes and will continue rating at this frequency for a total of 5 hours.  The patient has been asked to complete the Diary and return to [place] by mail, fax or hand delivered as soon as possible.   Additional Comments:  No complications occurred Dressing: 2 x 2 sterile gauze and Band-Aid    Post-procedure details: Patient was  observed during the procedure. Post-procedure instructions were reviewed.  Patient left the clinic in stable condition.   Clinical History: CT CERVICAL SPINE WITHOUT CONTRAST   TECHNIQUE: Multidetector CT imaging of the cervical spine was performed without intravenous contrast. Multiplanar CT image reconstructions were also generated.   COMPARISON:  Cervical MRI 03/06/2019   FINDINGS: Alignment: Normal.   Skull base and vertebrae: C5-6 and C6-7 ACDF with solid arthrodesis. The ventral plate is in good position. No evidence of fracture or bone lesion   Soft tissues and spinal canal: No prevertebral fluid or swelling. No visible canal hematoma.   Disc levels:   C2-3: Asymmetric right uncovertebral and endplate ridging with mild to moderate right foraminal narrowing   C3-4: Disc narrowing with asymmetric right uncovertebral ridging and moderate right foraminal impingement   C4-5: Ventral spondylitic spurring.  No evidence of impingement   C5-6: ACDF with solid arthrodesis.   C6-7: ACDF with solid arthrodesis. Notable uncovertebral spurs with moderate bilateral residual narrowing.   C7-T1:Narrow disc with mild left foraminal narrowing   Upper chest: Negative   IMPRESSION: 1. C5-C7 ACDF with solid arthrodesis. 2. Mild-to-moderate right foraminal narrowing at C2-3 and C3-4 from disc height loss and uncovertebral ridging.     Electronically Signed   By: Monte Fantasia M.D.   On: 11/01/2019 10:47  ----  MRI LUMBAR SPINE WITHOUT CONTRAST     TECHNIQUE:  Multiplanar, multisequence MR imaging of the lumbar spine was  performed. No intravenous contrast was administered.     COMPARISON:  Lumbar MRI 04/11/2018. CT lumbar spine 03/26/2018.     FINDINGS:  Segmentation: Normal as seen by CT, the same numbering system used  on the prior MRI.     Alignment:  Stable lumbar lordosis.     Vertebrae: Minimal degenerative appearing marrow edema at the  anterior L5-S1  endplates. Normal background bone marrow signal. No  other acute osseous abnormality identified. Intact visible sacrum  and SI joints.     Conus medullaris and cauda equina: Conus extends to the T12 level.  No lower spinal cord or conus signal abnormality.     Paraspinal and other soft tissues: Partially visible sigmoid  diverticulosis. Otherwise negative Visualized abdominal viscera and  paraspinal soft tissues.     Disc levels:     T11-T12: Mild disc bulging appears stable.     T12-L1:  Negative.     L1-L2:  Anterior disc bulging and endplate spurring. No stenosis.     L2-L3: Borderline to mild spinal stenosis mostly related to  posterior element hypertrophy and mild epidural lipomatosis, stable.     L3-L4: Far lateral disc bulging and mild posterior element  hypertrophy. Borderline to mild right L3 foraminal stenosis is  stable.     L4-L5: Far lateral disc bulging and mild to moderate posterior  element hypertrophy. No stenosis.     L5-S1: Chronic circumferential disc bulging with superimposed small  central disc protrusion (series 6, image 38). Mild posterior element  hypertrophy. Mild involvement of  the lateral recesses (S1 nerve  levels) without spinal stenosis. Stable borderline to mild L5  foraminal stenosis.     IMPRESSION:  1. Stable MRI appearance of the lumbar spine since 2019.  2. L5-S1 disc bulge and small central disc protrusion resulting in  up to mild lateral recess and foraminal stenosis.  3. Far lateral disc bulging elsewhere with superimposed posterior  element hypertrophy. Up to mild spinal stenosis at L2-L3 and right  foraminal stenosis at L3-L4.        Electronically Signed    By: Genevie Ann M.D.    On: 03/07/2019 01:41     Objective:  VS:  HT:     WT:    BMI:      BP:(!) 167/103   HR:96bpm   TEMP: ( )   RESP:  Physical Exam Vitals and nursing note reviewed.  Constitutional:      General: He is not in acute distress.    Appearance: Normal  appearance. He is not ill-appearing.  HENT:     Head: Normocephalic and atraumatic.     Right Ear: External ear normal.     Left Ear: External ear normal.     Nose: No congestion.  Eyes:     Extraocular Movements: Extraocular movements intact.  Cardiovascular:     Rate and Rhythm: Normal rate.     Pulses: Normal pulses.  Pulmonary:     Effort: Pulmonary effort is normal. No respiratory distress.  Abdominal:     General: There is no distension.     Palpations: Abdomen is soft.  Musculoskeletal:        General: No tenderness or signs of injury.     Cervical back: Neck supple.     Right lower leg: No edema.     Left lower leg: No edema.     Comments: Patient has good distal strength without clonus. Patient somewhat slow to rise from a seated position to full extension.  There is concordant low back pain with facet loading and lumbar spine extension rotation.  There are no definitive trigger points but the patient is somewhat tender across the lower back and PSIS.  There is no pain with hip rotation.   Skin:    Findings: No erythema or rash.  Neurological:     General: No focal deficit present.     Mental Status: He is alert and oriented to person, place, and time.     Sensory: No sensory deficit.     Motor: No weakness or abnormal muscle tone.     Coordination: Coordination normal.  Psychiatric:        Mood and Affect: Mood normal.        Behavior: Behavior normal.     Imaging: No results found.

## 2021-07-22 ENCOUNTER — Other Ambulatory Visit: Payer: Self-pay | Admitting: Internal Medicine

## 2021-07-22 MED ORDER — DEXAMETHASONE 4 MG PO TABS: ORAL_TABLET | ORAL | 0 refills | Status: DC

## 2021-07-22 MED ORDER — ACETAMINOPHEN-CODEINE #3 300-30 MG PO TABS
ORAL_TABLET | ORAL | 0 refills | Status: DC
Start: 2021-07-22 — End: 2021-07-29

## 2021-07-22 MED ORDER — AZITHROMYCIN 250 MG PO TABS: ORAL_TABLET | ORAL | 1 refills | Status: DC

## 2021-07-23 ENCOUNTER — Ambulatory Visit: Payer: BC Managed Care – PPO | Admitting: Podiatry

## 2021-07-25 ENCOUNTER — Other Ambulatory Visit: Payer: Self-pay | Admitting: Internal Medicine

## 2021-07-25 ENCOUNTER — Encounter: Payer: Self-pay | Admitting: Internal Medicine

## 2021-07-25 DIAGNOSIS — R0902 Hypoxemia: Secondary | ICD-10-CM

## 2021-07-25 DIAGNOSIS — R0600 Dyspnea, unspecified: Secondary | ICD-10-CM

## 2021-07-25 DIAGNOSIS — R051 Acute cough: Secondary | ICD-10-CM

## 2021-07-26 ENCOUNTER — Ambulatory Visit (INDEPENDENT_AMBULATORY_CARE_PROVIDER_SITE_OTHER): Payer: BC Managed Care – PPO | Admitting: Internal Medicine

## 2021-07-26 ENCOUNTER — Ambulatory Visit
Admission: RE | Admit: 2021-07-26 | Discharge: 2021-07-26 | Disposition: A | Discharge status: Home / Self Care | Payer: BC Managed Care – PPO | Source: Ambulatory Visit | Attending: Internal Medicine | Admitting: Internal Medicine

## 2021-07-26 ENCOUNTER — Other Ambulatory Visit: Payer: Self-pay

## 2021-07-26 ENCOUNTER — Encounter: Payer: Self-pay | Admitting: Internal Medicine

## 2021-07-26 VITALS — BP 140/98 | HR 79 | Temp 97.9°F | Resp 20 | Ht 72.0 in | Wt 375.0 lb

## 2021-07-26 DIAGNOSIS — I517 Cardiomegaly: Secondary | ICD-10-CM | POA: Diagnosis not present

## 2021-07-26 DIAGNOSIS — J4541 Moderate persistent asthma with (acute) exacerbation: Secondary | ICD-10-CM

## 2021-07-26 DIAGNOSIS — I872 Venous insufficiency (chronic) (peripheral): Secondary | ICD-10-CM

## 2021-07-26 DIAGNOSIS — R0689 Other abnormalities of breathing: Secondary | ICD-10-CM | POA: Diagnosis not present

## 2021-07-26 DIAGNOSIS — U071 COVID-19: Secondary | ICD-10-CM | POA: Diagnosis not present

## 2021-07-26 DIAGNOSIS — R0902 Hypoxemia: Secondary | ICD-10-CM

## 2021-07-26 DIAGNOSIS — R0600 Dyspnea, unspecified: Secondary | ICD-10-CM

## 2021-07-26 DIAGNOSIS — R051 Acute cough: Secondary | ICD-10-CM

## 2021-07-26 DIAGNOSIS — Z8701 Personal history of pneumonia (recurrent): Secondary | ICD-10-CM | POA: Diagnosis not present

## 2021-07-26 MED ORDER — LEVOFLOXACIN 750 MG PO TABS: ORAL_TABLET | ORAL | 0 refills | Status: DC

## 2021-07-26 MED ORDER — FUROSEMIDE 80 MG PO TABS: ORAL_TABLET | ORAL | 1 refills | Status: DC

## 2021-07-26 MED ORDER — DEXAMETHASONE 4 MG PO TABS: ORAL_TABLET | ORAL | 0 refills | Status: DC

## 2021-07-26 NOTE — Progress Notes (Signed)
? ? ?Future Appointments  ?Date Time Provider Department  ?08/05/2021  3:00 PM Magnus Sinning, MD [facility]  ?08/08/2021  2:30 PM [name], MD [facility]  ?11/12/2021  3:45 PM [name], MD [facility]  ?11/13/2021  3:30 PM Liane Comber, NP [facility]  ?02/18/2022  3:00 PM [name], MD [facility]  ? ? ?History of Present Illness: ? ?   Patient is a very nice 61 yo single WM with multiple chronic co-morbidities including  HTN, HLD, Morbid Obesity (BMI 46+),  T2_DM, Polypharmacy,  Low T, Allergic Asthma  /Restrictive Lung Disease and Vitamin D Deficiency who was treated 5 days ago with a Z-pak & Decadron taper and persists with upper/lower respiratory congestion. He reports O2 sats have been in the low 90 's range . Sputum has become green. CXR done earlier today showed no pneumonia. He has gained 12# over the last 10 day. He admits that he has not been taking his Lasix.  ? ?Wt Readings from Last 3 Encounters:  ?07/26/21  375 lb   ?07/18/21  363 lb   ?07/16/21  366 lb   ? ? ?Medications ? ?  metFORMIN XR) 500 MG 2, Takes 2 tablets  2 x /day  with Meals  for Diabetes ?  Semaglutide, 1 MG/DOSE, (OZEMPIC, 1 MG/DOSE,) 4 MG/3ML , Inject 1 mg ( 0.75 ml) into the Skin  Once Week   for Diabetes  (Dx: e11.29) ?  bisoprolol-hctz 5-6.25 MG tablet, TAKE 1 TABLET EVERY DAY  ?  EPINEPHrine 0.3 mg/0.3 mL I, Inject 0.3 mg into the muscle as needed. ?  pravastatin 40 MG tablet, TAKE 1 TABLET AT BEDTIME  ?  tadalafil  5 MG tablet, Take  daily.  ? ?  albuterol HFA inhaler, INHALE 2 PUFFS EVERY 4 HOURS AS NEEDED  ?  Azelastine SPRAY , Place 2 sprays into both nostrils 2  times daily ?  benzonatate  200 MG capsule, Take 1 perle 3 x / day to prevent cough ?  budesonide 0.5 MG/2ML neb soln, Take 2 mLs by neb  3 times daily as needed. ?  cetirizine 10 MG tablet, Take 1 tablet  2  times daily as needed for allergies  ?  FLONASE nasal spray, Use 1 to 2 sprays each Nostril 2 x /day ?  TRELEGY ELLIPTA 100-62.5-25 , Inhale 1  puff  daily ?  Guaifenesin 1200 MG TB12, Take 2 times daily. ?  DUONEB)0.5-2.5 (3) MG/3ML SOLN, Take 3 mLs by neb every 4  hours as needed. ?  montelukast  10 MG tablet, TAKE 1 TABLET DAILY  ?  oxymetazoline (AFRIN) 0.05 % nasal spray, Place 1 spray into nostrils at bedtime. ?  promethazine-DM 6.25-15 MG/5ML syrup, Take 5 mLs 4  times daily as needed ?  sodium chloride  nasal spray, Place 1 spray into nostrils 4 times daily as needed  ?  acetaminophen 500 MG tablet, Take 1,000 mg  every 6  hours as needed  ?  acetaminophen-codeine #3  (300-30 mg) tablet, Take  1 tablet  every 3 to 4 hours  as needed for severe cough ?  celecoxib 200 MG capsule, TAKE 1 CAPSULE EVERY 12 HOURS. ?  anastrozole ARIMIDEX 1 MG tablet, Take 1 mg daily. ?  VITAMIN C 1000 MG, Take 1,000 mg every evening. ?  Botulinum Toxin Type A 200 units , Inject 200 Units into the skin every 3 months. ?  cetaphil lotion, Apply 2  times daily ?  VITAMIN D 5,000  u , Take 5 tablets (25,000 Units)  Take 15000 in the morning and 10,000 in the evening ?  Cinnamon 500 MG capsule, Take 1,000 mg 3 times daily. ?  clotrimazole-betamethasone  cream, APPLY  TWICE A DAY as needed ?  Coenzyme Q10 (CO Q 10 PO), Take 600 mg daily. ?  cyclobenzaprine 10 MG tablet, Take 10 mg times daily. ?  diclofenac 1 % GEL, APPLY 2 TO 4 GRAMS TOPICALLY 2 TO 4 TIMES DAILY  ?  donepezil 23 MG TABS tablet, Take 23 mg  daily. ?  doxycycline  100 MG capsule, TAKE 1 CAPSULE  TWICE A DAY ?  dupilumab (DUPIXENT) 300 MG/Inject 300 mg into the skin every 14 days. ?  Eluxadoline 100 MG TABS, Take 100 mg by mouth 2 times daily.  ?  Krill Oil 500 MG CAPS, Take at bedtime. ?  Magnesium 500 MG CAPS, Take daily with supper. ?  memantine 10 MG tablet, Take 2   times daily. ?   (GLUCOS-CHONDROIT-MSM  TABS, Take 2 tablets 3 times daily. ?  Multiple Minerals-Vitamins  ?  Vitamin  Take 1 tablet 2 times daily. ?  Multiple Vitamins-Minerals  ?  Take 1 tablet by mouth daily. ?  Multiple Vitamins-Minerals  (PRESERVISION AREDS 2 CAPS, Take 1 capsule   times daily. ?  ondansetron-ODT 4 MG disintegrating tablet, TAKE 1 TABLET  EVERY 6  HOURS AS NEEDED ?  pentosan polysulfate (ELMIRON) 100 MG capsule, Take 100 mg 2  times daily. ?  potassium chloride 20 MEQ tablet, TAKE 1 TABLET TWICE DAILY FOR POTASSIUM ?  pregabalin 150 MG capsule, TAKE 1 CAPSULE THREE TIMES A DAY ?  PROBIOTIC   Take 1 capsule  2 times daily. VSL ?  sertraline  50 MG tablet, Take 2 times daily.  ?  trospium 20 MG tablet, Take   2 times daily ?  TURMERIC 1,160 mg, Take  3  times daily. ?  zinc 50 MG tablet, Take 50 mg by mouth 3 ( times daily. ? ?Problem list ?He has Hypertension; Hyperlipidemia, mixed; BPH (benign prostatic hyperplasia); Testosterone Deficiency; IBS (irritable bowel syndrome); Partial complex seizure disorder with intractable epilepsy ([place]); Depression, major, recurrent, in partial remission ([place]); Asthma-COPD overlap syndrome ([place]); Diverticula of colon; Positive TB test; Vitamin D deficiency; Prediabetes; GERD (gastroesophageal reflux disease); Medication management; SDAT; OSA and COPD overlap syndrome ([place]); Morbid obesity with BMI of 45.0-49.9, adult ([place]); Polypharmacy; Recurrent infections; Chronic nonseasonal allergic rhinitis due to pollen; Lumbar radiculopathy; Migraine without aura and without status migrainosus, not intractable; Osteoarthritis of spine with radiculopathy, lumbar region; Risk for falls; Cervicalgia; Munchausen syndrome; Seasonal and perennial allergic rhinitis; Hymenoptera allergy; Chronic respiratory failure with hypoxia ([place]); Other spondylosis with radiculopathy, cervical region; Family history of colonic polyps; Memory difficulty; Myofascial pain; Morbid obesity ([place]); HNP (herniated nucleus pulposus) with myelopathy, cervical; Spinal stenosis of cervical region; Status post cervical spinal fusion; Tracheomalacia; Acquired tracheomalacia; Bilateral lower extremity edema; Status post cervical discectomy;  and Herniation of cervical intervertebral disc with radiculopathy on their problem list. ?  ?Observations/Objective: ? ?BP (!) 140/98   Pulse 79   Temp 97.9 ?F (36.6 ?C)   Resp 20   Ht 6' (1.829 m)   Wt (!) 375 lb (170.1 kg)   SpO2 94%   BMI 50.86 kg/m?  ? ?Repeated O2 sat = 90% . ? ?Brassy coarse congested cough. Voice very hoarse. No stridor. ? ?HEENT - EACs patent & TMs Nl. N/O/P clear.  ?Neck - supple.  ?  Chest - Bilat scattered coarse inspiratory rales & expiratory  ?coarse rhonchi not clearing with cough. No wheezing is evident.  ?Cor - Nl HS. RRR w/o sig MGR. PP 1(+). 1-2 (+) pitting edema to the knees. ?MS- FROM w/o deformities.  Gait Nl. ?Neuro -  Nl w/o focal abnormalities. ? ?Assessment and Plan: ? ?1. Bronchitis, asthmatic, moderate persistent, with acute exacerbation ? ?- levofloxacin 750 MG tablet;  ?Take  1 tablet  Daily  for Infection   ?Dispense: 15 tablet ? ?- Dexamethasone 4 MG tablet;  ?Take 1 tab 3 x day - 3 days, then 2 x day - 3 days, then 1 tab daily   ?Dispense: 20 tablet ? ?2. Acute respiratory insufficiency ? ?- continue O2 sat monitoring & if sats persistently below 88-90% to go to ER ? ?3. Edema of both lower extremities due to peripheral venous insufficiency ? ?- Advised restart his Lasix ? ?- furosemide (LASIX) 80 MG tablet;  ?Take 1 tablet  2 x /day  for Fluid Retention / Leg swelling   ?Dispense: 180 tablet; Refill: 1 ? ?Follow Up Instructions: ?  ?    I discussed the assessment and treatment plan with the patient. The patient was provided an opportunity to ask questions and all were answered. The patient agreed with the plan and demonstrated an understanding of the instructions. Advised f/u  in  2 weeks.  ?  ?    The patient was advised to call back or seek an in-person evaluation if the symptoms worsen or if the condition fails to improve as anticipated. ? ? ?Kirtland Bouchard, MD ? ?

## 2021-07-29 ENCOUNTER — Other Ambulatory Visit: Payer: Self-pay | Admitting: Internal Medicine

## 2021-07-29 ENCOUNTER — Other Ambulatory Visit: Payer: Self-pay | Admitting: Allergy & Immunology

## 2021-07-29 DIAGNOSIS — R059 Cough, unspecified: Secondary | ICD-10-CM

## 2021-08-02 ENCOUNTER — Telehealth: Payer: Self-pay

## 2021-08-02 NOTE — Telephone Encounter (Signed)
Patient called and would like his Monday app canceled and rescheduled  ?

## 2021-08-05 ENCOUNTER — Ambulatory Visit: Payer: BC Managed Care – PPO | Admitting: Physical Medicine and Rehabilitation

## 2021-08-08 ENCOUNTER — Ambulatory Visit (INDEPENDENT_AMBULATORY_CARE_PROVIDER_SITE_OTHER): Payer: BC Managed Care – PPO | Admitting: Internal Medicine

## 2021-08-08 ENCOUNTER — Encounter: Payer: Self-pay | Admitting: Internal Medicine

## 2021-08-08 ENCOUNTER — Other Ambulatory Visit: Payer: Self-pay

## 2021-08-08 VITALS — BP 140/88 | HR 88 | Temp 97.9°F | Resp 18 | Ht 77.0 in | Wt 366.0 lb

## 2021-08-08 DIAGNOSIS — J4541 Moderate persistent asthma with (acute) exacerbation: Secondary | ICD-10-CM | POA: Diagnosis not present

## 2021-08-08 DIAGNOSIS — E1122 Type 2 diabetes mellitus with diabetic chronic kidney disease: Secondary | ICD-10-CM | POA: Diagnosis not present

## 2021-08-08 DIAGNOSIS — N181 Chronic kidney disease, stage 1: Secondary | ICD-10-CM | POA: Diagnosis not present

## 2021-08-08 MED ORDER — OZEMPIC (2 MG/DOSE) 8 MG/3ML ~~LOC~~ SOPN: PEN_INJECTOR | SUBCUTANEOUS | 1 refills | Status: DC

## 2021-08-08 MED ORDER — CEFUROXIME AXETIL 500 MG PO TABS: ORAL_TABLET | ORAL | 0 refills | Status: DC

## 2021-08-08 MED ORDER — DEXAMETHASONE 4 MG PO TABS: ORAL_TABLET | ORAL | 0 refills | Status: DC

## 2021-08-08 NOTE — Progress Notes (Signed)
? ? ?Future Appointments  ?Date Time Provider Department  ?08/08/2021  2:30 PM [name], MD [facility]  ?11/12/2021  3:45 PM [name], MD [facility]  ?11/13/2021               3:30 PM Liane Comber, NP [facility]  ?02/18/2022  3:00 PM [name], MD [facility]  ? ? ?History of Present Illness: ? ?    Patient is a very nice 61 yo single WM with   HTN, HLD, Morbid Obesity (BMI 46+),  T2_DM, Polypharmacy,  Low T, Allergic Asthma /Restrictive Lung Disease and Vitamin D Deficiency   who returns for 12 day short f/u  of treatment of AECB  w/Levaquin & Decadron taper and who was also advised to restart his lasix for worsening dependent LE edema.  ? ?   Patient reports improvement, but still coughing productively. Denies fever /sweats, chills/rigors, rash or  dyspnea.  Patient has lost 9 # since restarting  Lasix 12 days ago.  Matthan has been on Doxycycline daily /continuously for the last 2 years & had recently been treated with a course of Azithromycin, and failing that, he was most recently treated with a course of Levaquin, so likely, he  is resistant to all 3 of those Antibiotic  spectrums. ? ? ?Medications ? ?Current Outpatient Medications (Endocrine & Metabolic):  ?  dexamethasone (DECADRON) 4 MG tablet, Take 1 tab 3 x day - 3 days, then 2 x day - 3 days, then 1 tab daily ?  metFORMIN (GLUCOPHAGE-XR) 500 MG 24 hr tablet, Takes 2 tablets  2 x /day  with Meals  for Diabetes ?  Semaglutide, 1 MG/DOSE, (OZEMPIC, 1 MG/DOSE,) 4 MG/3ML, Inject 1 mg ( 0.75 ml) into the Skin  Once Week   for Diabetes  (Dx: e11.29) ? ?Current Outpatient Medications (Cardiovascular):  ?  bisoprolol-hydrochlorothiazide (ZIAC) 5-6.25 MG tablet, TAKE 1 TABLET BY MOUTH EVERY DAY FOR BLOOD PRESSURE ?  EPINEPHrine 0.3 mg/0.3 mL IJ SOAJ injection, Inject 0.3 mg into the muscle as needed. ?  furosemide (LASIX) 80 MG tablet, Take 1 tablet  2 x /day  for Fluid Retention / Leg swelling ?  pravastatin (PRAVACHOL) 40 MG tablet, TAKE 1  TABLET BY MOUTH AT BEDTIME FOR CHOLESTEROL ?  tadalafil (CIALIS) 5 MG tablet, Take 5 mg by mouth daily.  ? ?Current Outpatient Medications (Respiratory):  ?  Azelastine HCl 137 MCG/SPRAY SOLN, Place 2 sprays into both nostrils 2 (two) times daily. USE 1 TO 2 SPRAYS EACH NOSTRIL 2 X /DAY ?  benzonatate 200 MG capsule, TAKE 1 CAPSULE BY MOUTH 3 TIMES A DAY ?  budesonide  0.5 MG/2ML nebulizer solution, Take 2 mLs by nebulization 3x /da Can take an extra dose during flares). ?  FLONASE nasal spray, Place 2 sprays into both nostrils daily. ?  TRELEGY ELLIPTA 100-62.5-25 , Inhale 1 puff daily ?  Guaifenesin 1200 MG TB12, Take 1,200 mg by mouth 2 (two) times daily. ?  ipratropium-albuterol (DUONEB) 0.5-2.5 (3) MG/3ML SOLN, Take 3 mLs by nebulization every 4 (four) hours as needed. ?  montelukast 10 MG tablet, TAKE 1 TABLET DAILY FOR ALLERGIES ?  oxymetazoline  0.05 % nasal spray, Place 1 spray into both nostrils at bedtime. ?  promethazine-DM) 6.25-15 MG/5ML syrup, Take 5 mLs 4  times daily as needed for cough. ? ?  VENTOLIN HFA  inhaler, INHALE 2 PUFFS  EVERY 4 HOURS AS NEEDED  ? ?  acetaminophen (TYLENOL) 500 MG tablet, Take 1,000 mg  by mouth every 6 (six) hours as needed for moderate pain. ?  Acetaminophen-Codeine 300-30 MG tablet, TAKE 1 TABLET EVERY 3 TO 4 HOURS AS NEEDED FOR SEVERE COUGH ?  celecoxib  200 MG capsule, TAKE 1 CAPSULE EVERY 12 HOURS. ? ?  anastrozole (ARIMIDEX) 1 MG tablet, Take 1 mg by mouth daily. ?  VITAMIN C 1000 MG tablet, Take 1,000 mg by mouth every evening. ?  Botulinum Toxin Type A 200 units SOLR, Inject 200 Units into the skin every 3 (three) months. ?  cetaphil  lotion, Apply topically 2  times daily.  ?  VITAMIN D 5000 u, Take 5 tablets (25,000 u total) daily  ?  Cinnamon 500 MG capsule, Take 1,000 mg  3  times daily. ?  clotrimazole-betamethasone cream, APPLY TO AFFECTED AREA TWICE A DAY  ?  Coenzyme Q10 , Take 600 mg daily. ?  cyclobenzaprine (FLEXERIL) 10 MG tablet, Take  3 (three) times  daily. ?  diclofenac  1 % GEL, APPLY 2 TO 4 GRAMS TOPICALLY 2 TO 4 TIMES DAILY  ?  donepezil (ARICEPT) 23 MG TABS tablet, Take 23 mg by mouth daily. ?  dupilumab (DUPIXENT) 300 MG/2ML, Inject 300 mg into the skin every 14  days. ?  Eluxadoline 100 MG TABS, Take 100 mg by mouth 2 (two) times daily.  ?  Krill Oil 500 MG CAPS, Take 500 mg by mouth at bedtime. ?  levofloxacin (LEVAQUIN) 750 MG tablet, Take  1 tablet  Daily  for Infection ?  Magnesium 500 MG CAPS, Take 500 mg by mouth daily with supper. ?  memantine (NAMENDA) 10 MG tablet, Take 10 mg by mouth 2 (two) times daily. ?  GLUCOS-CHONDROIT-MSM COMPLEX, Take 2 tablets by mouth 3 (three) times daily. ?  Multiple Minerals-Vitamins  TABS, Take 1 tablet by mouth 2 (two) times daily. Vitamin D ?  Multiple Vitamins-Mineralstablet, Take 1 tablet by mouth daily. ?  Multiple Vitamins-Minerals (PRESERVISION AREDS 2) CAPS, Take 1 capsule by mouth 2 (two) times daily. ?  ondansetron (ZOFRAN-ODT) 4 MG disintegrating tablet, TAKE 1 TABLET (4 MG TOTAL) BY MOUTH EVERY 6 (SIX) HOURS AS NEEDED FOR NAUSEA OR VOMITING. ?  pentosan polysulfate (ELMIRON) 100 MG capsule, Take 100 mg by mouth 2 (two) times daily. ?  potassium chloride SA (KLOR-CON M20) 20 MEQ tablet, TAKE 1 TABLET TWICE DAILY FOR POTASSIUM ?  pregabalin (LYRICA) 150 MG capsule, TAKE 1 CAPSULE BY MOUTH THREE TIMES A DAY ?  PROBIOTIC PRODUCT PO, Take 1 capsule by mouth 2 (two) times daily. VSL ?  sertraline (ZOLOFT) 50 MG tablet, Take 50 mg by mouth 2 (two) times daily.  ?  trospium (SANCTURA) 20 MG tablet, Take 20 mg by mouth 2 (two) times daily. In the evening and at bedtime ?  TURMERIC , Take 1,160 mg by mouth 3 (three) times daily. ?  zinc gluconate 50 MG tablet, Take 50 mg by mouth 3 (three) times daily. ? ?Problem list ?He has Hypertension; Hyperlipidemia, mixed; BPH (benign prostatic hyperplasia); Testosterone Deficiency; IBS (irritable bowel syndrome); Partial complex seizure disorder with intractable epilepsy  ([place]); Depression, major, recurrent, in partial remission ([place]); Asthma-COPD overlap syndrome ([place]); Diverticula of colon; Positive TB test; Vitamin D deficiency; Prediabetes; GERD (gastroesophageal reflux disease); Medication management; SDAT; OSA and COPD overlap syndrome ([place]); Morbid obesity with BMI of 45.0-49.9, adult ([place]); Polypharmacy; Recurrent infections; Chronic nonseasonal allergic rhinitis due to pollen; Lumbar radiculopathy; Migraine without aura and without status migrainosus, not intractable; Osteoarthritis of spine with  radiculopathy, lumbar region; Risk for falls; Cervicalgia; Munchausen syndrome; Seasonal and perennial allergic rhinitis; Hymenoptera allergy; Chronic respiratory failure with hypoxia ([place]); Other spondylosis with radiculopathy, cervical region; Family history of colonic polyps; Memory difficulty; Myofascial pain; Morbid obesity ([place]); HNP (herniated nucleus pulposus) with myelopathy, cervical; Spinal stenosis of cervical region; Status post cervical spinal fusion; Tracheomalacia; Acquired tracheomalacia; Bilateral lower extremity edema; Status post cervical discectomy; and Herniation of cervical intervertebral disc with radiculopathy on their problem list. ?  ?Observations/Objective: ? ?BP 140/88   P  88   T 97.9 ?F   R 18   Ht '6\' 5"'$     Wt   366 lb  /  SpO2 95%   BMI 43.40   ?                                        ( He monitors pulse Ox sats at home )  ? ?Dry cough. No Stridor. ? ?HEENT - WNL. ?Neck - supple.  ?Chest - BS decreased consequent of chest wall thickness.  ?             Few scattered rales  /rhonchi . No wheezes.  ?Cor - Nl HS. RRR w/o sig MGR. PP 1(+). 1+ pretibial edema.  ?MS- FROM w/o deformities.  Gait Nl. ?Neuro -  Nl w/o focal abnormalities. ?Skin  - No rash, cyanosis.  ? ?Assessment and Plan: ? ? ?1. Bronchitis, asthmatic, moderate persistent, with acute exacerbation ? ?- dexamethasone  4 MG tablet;  ?Take 1 tab 3 x day - 3 days, then 2 x day - 3 days,  then 1 tab daily   ?Dispense: 20 tablet ? ?- cefUROXime (CEFTIN) 500 MG tablet;  ?Take  1 tablet  2 x /day     ?Dispense: 30 tablet ? ? ?2. Type 2 diabetes mellitus with stage 1 chronic kidney  ?disease, without

## 2021-08-11 ENCOUNTER — Encounter: Payer: Self-pay | Admitting: Internal Medicine

## 2021-08-11 ENCOUNTER — Other Ambulatory Visit: Payer: Self-pay | Admitting: Internal Medicine

## 2021-08-11 DIAGNOSIS — S161XXD Strain of muscle, fascia and tendon at neck level, subsequent encounter: Secondary | ICD-10-CM

## 2021-08-11 MED ORDER — CYCLOBENZAPRINE HCL 10 MG PO TABS: ORAL_TABLET | ORAL | 0 refills | Status: DC

## 2021-08-14 DIAGNOSIS — J9611 Chronic respiratory failure with hypoxia: Secondary | ICD-10-CM | POA: Diagnosis not present

## 2021-08-14 DIAGNOSIS — J449 Chronic obstructive pulmonary disease, unspecified: Secondary | ICD-10-CM | POA: Diagnosis not present

## 2021-08-22 ENCOUNTER — Encounter: Payer: Self-pay | Admitting: Internal Medicine

## 2021-08-22 ENCOUNTER — Ambulatory Visit (INDEPENDENT_AMBULATORY_CARE_PROVIDER_SITE_OTHER): Payer: BC Managed Care – PPO | Admitting: Internal Medicine

## 2021-08-22 ENCOUNTER — Other Ambulatory Visit: Payer: Self-pay | Admitting: Internal Medicine

## 2021-08-22 VITALS — BP 144/71 | HR 98 | Temp 98.0°F | Resp 18 | Ht 72.0 in | Wt 369.0 lb

## 2021-08-22 DIAGNOSIS — R059 Cough, unspecified: Secondary | ICD-10-CM

## 2021-08-22 DIAGNOSIS — J40 Bronchitis, not specified as acute or chronic: Secondary | ICD-10-CM

## 2021-08-22 MED ORDER — DEXAMETHASONE 4 MG PO TABS: ORAL_TABLET | ORAL | 0 refills | Status: DC

## 2021-08-22 MED ORDER — AZITHROMYCIN 250 MG PO TABS: ORAL_TABLET | ORAL | 1 refills | Status: DC

## 2021-08-22 MED ORDER — PROMETHAZINE-DM 6.25-15 MG/5ML PO SYRP: ORAL_SOLUTION | ORAL | 3 refills | Status: DC

## 2021-08-22 NOTE — Progress Notes (Signed)
? ? ?Future Appointments  ?Date Time Provider Department  ?11/12/2021  3:45 PM [name], MD [facility]  ?11/13/2021  3:30 PM Liane Comber, NP [facility]  ?02/18/2022  3:00 PM [name], MD [facility]  ? ?History of Present Illness: ? ?    Patient is a very nice 61 yo single WM with  HTN, HLD, Morbid Obesity (BMI 46+),  T2_DM, Polypharmacy,  Low T, Allergic Asthma /Restrictive Lung Disease and Vitamin D Deficiency who returns for 2 week f/u  after treatment of AECB  w /recent courses of Doxycycline, Azithromycin, Levaquin  in the past & was most recently treated with Ceftin. He was also advised to restart his lasix for worsening dependent LE edema. He returns today reporting occas fever to 99-101 deg, occasional cough productive of a yellowish green sputum.  ? ?Medications ? ?  metFORMIN XR 500 MG , Takes 2 tablets  2 x /day  with Meals  for Diabetes ?  bisoprolol-hctz 5-6.25 MG tablet, TAKE 1 TABLET EVERY DAY  ?  EPINEPHrine 0.3 mg/0.3 mL injection, Inject as needed for severe allergic reaction ?  furosemide 80 MG tablet, Take 1 tablet  2 x /day   ?  pravastatin 40 MG tablet, TAKE 1 TABLET AT BEDTIME  ?  tadalafil  5 MG tablet, Take 5 mg  daily.  ?  Azelastine HCl 1, USE 1 TO 2 SPRAYS EACH NOSTRIL 2 X /DAY ?  benzonatate 200 MG capsule, TAKE 1 CAPSULE  3 TIMES A DAY ?  budesonide 0.5 MG/2ML neb soln, Take 2 mLs  by neb 3  times daily  ?  cetirizine  10 MG tablet, Take 1 tablet  2 times daily as needed  ?  FLONASE nasal spray, use 1 to 2 sprays each Nostril 2 x /day ?  TRELEGY 100-62.5-25  Inhale 1 puff daily ?  Guaifenesin 1200 MG TB12, Take 2 times daily. ?  ipratropium-albuterol (DUONEB) 0.5-2.5 (3) MG/3ML SOLN, Take 3 mLs every 4  hrs as needed. ?  montelukast  10 MG tablet, TAKE 1 TABLET DAILY FOR ALLERGIES ?  oxymetazoline 0.05 % nasal spray, Place 1 spray into both nostrils at bedtime. ?  promethazine-DM  syrup, Take 5 mLs  4  times daily as needed   ?  VENTOLIN HFA  inhaler, INHALE 2 PUFFS    EVERY 4 HRS AS NEEDED ?  acetaminophen  500 MG tablet, Take 1,000 mg every 6 hours as needed ?  celecoxib  200 MG , TAKE 1 CAPSULE  EVERY 12 HOURS. ?  anastrozole  1 MG tablet, Take 1 mg  daily. ?  VITAMIN C 1000 MG tablet, Take 1, every evening. ?  Botulinum Toxin Type A 200 units SOLR, Inject 200 Units into the skin every 3 months. ?  cetaphil ) lotion, Apply t 2 (two) times daily ?  VITAMIN D  5000 u, Take 15000 in the morning and 10,000 in the evening ?  Cinnamon 500 MG capsule, Take 1,000 mg 3 times daily. ?  clotrimazole-betamethasone cream, APPLY TO AFFECTED AREA TWICE A DAY  as needed  ?  Coenzyme Q10  600 mg , Take daily. ?  cyclobenzaprine 10 MG tablet, Take  1/2 to 1 tablet  3 x day  only if need for muscle spasm ?  diclofenac S1 % GEL, APPLY 2 TO 4 GRAMS  2 TO 4 TIMES DAILY ?  donepezil 23 MG , Take  daily. ?  dupilumab (DUPIXENT) 300 MG/2ML , Inject 300  mg every 14 days. ?  Eluxadoline 100 MG TABS, Take  2  times daily.  ?  Krill Oil 500 MG CAPS, Take  at bedtime. ?  Magnesium 500 MG CAPS, Take  daily with supper. ?  memantine A) 10 MG tablet, Take 2 times daily. ?  GLUCOS-CHONDROIT-MSM TABS, Take 2 tablets  3  times daily. ?  Multiple Minerals-Vitamins , Take 1 tablet by  EVERY 6 HOURS AS NEEDED FOR NAUSEA OR VOMITING. ?  pentosan polysulfate (ELMIRON) 100 MG capsule, Take 2 (two) times daily. ?  potassium chloride  20 MEQ tablet, TAKE 1 TABLET TWICE DAILY  ?  pregabalin 150 MG capsule, TAKE 1 CAPSULE THREE TIMES A DAY ?  PROBIOTIC , Take 1 capsule  2 times daily.  ?  sertraline 50 MG tablet, Take  2  times daily.  ?  trospium 20 MG tablet, Take  2  times daily. ?  TURMERIC  Take 1,160 mg 3 times daily. ?  zinc gluconate 50 MG , Take  daily. ? ?Problem list ?He has Hypertension; Hyperlipidemia, mixed; BPH (benign prostatic hyperplasia); Testosterone Deficiency; IBS (irritable bowel syndrome); Partial complex seizure disorder with intractable epilepsy ([place]); Depression, major, recurrent, in partial  remission ([place]); Asthma-COPD overlap syndrome ([place]); Diverticula of colon; Positive TB test; Vitamin D deficiency; Prediabetes; GERD (gastroesophageal reflux disease); Medication management; SDAT; OSA and COPD overlap syndrome ([place]); Morbid obesity with BMI of 45.0-49.9, adult ([place]); Polypharmacy; Recurrent infections; Chronic nonseasonal allergic rhinitis due to pollen; Lumbar radiculopathy; Migraine without aura and without status migrainosus, not intractable; Osteoarthritis of spine with radiculopathy, lumbar region; Risk for falls; Cervicalgia; Munchausen syndrome; Seasonal and perennial allergic rhinitis; Hymenoptera allergy; Chronic respiratory failure with hypoxia ([place]); Other spondylosis with radiculopathy, cervical region; Family history of colonic polyps; Memory difficulty; Myofascial pain; Morbid obesity ([place]); HNP (herniated nucleus pulposus) with myelopathy, cervical; Spinal stenosis of cervical region; Status post cervical spinal fusion; Tracheomalacia; Acquired tracheomalacia; Bilateral lower extremity edema; Status post cervical discectomy; and Herniation of cervical intervertebral disc with radiculopathy on their problem list. ?  ?Observations/Objective: ? ?BP (!) 144/71   Pulse 98   Temp 98 ?F (36.7 ?C)   Resp 18   Ht 6' (1.829 m)   Wt (!) 369 lb (167.4 kg)   SpO2 95%   BMI 50.05 kg/m?  ? ?Morbid obesity. No respiratory distress. No Cyanosis , icterus or rash.  ? ?HEENT - WNL. ?Neck - supple.  ?Chest -  Few scattered rales & rhonchi . No Wheezes. ?Cor - Nl HS. RRR w/o sig MGR. PP 1(+). No edema. ?MS- FROM w/o deformities.  Gait Nl. ?Neuro -  Nl w/o focal abnormalities. ? ? ?Assessment and Plan: ? ? ?1. Bronchitis ? ?- Rx Zpak w/Refill & Decadron 4 mg taper.  ? ? ?Follow Up Instructions: ? ?    I discussed the assessment and treatment plan with the patient. The patient was provided an opportunity to ask questions and all were answered. The patient agreed with the plan and demonstrated an  understanding of the instructions. ?  ?    The patient was advised to call back or seek an in-person evaluation if the symptoms worsen or if the condition fails to improve as anticipated. ? ? ?Kirtland Bouchard, MD ? ?

## 2021-08-24 ENCOUNTER — Other Ambulatory Visit: Payer: Self-pay | Admitting: Internal Medicine

## 2021-08-24 MED ORDER — TIRZEPATIDE 7.5 MG/0.5ML ~~LOC~~ SOAJ: 7.5000 mg | SUBCUTANEOUS | 0 refills | Status: DC

## 2021-08-27 ENCOUNTER — Other Ambulatory Visit: Payer: Self-pay | Admitting: *Deleted

## 2021-08-27 MED ORDER — DOXYCYCLINE MONOHYDRATE 100 MG PO TABS: 100.0000 mg | ORAL_TABLET | Freq: Two times a day (BID) | ORAL | 5 refills | Status: DC

## 2021-09-03 ENCOUNTER — Ambulatory Visit
Admission: RE | Admit: 2021-09-03 | Discharge: 2021-09-03 | Disposition: A | Discharge status: Home / Self Care | Payer: BC Managed Care – PPO | Source: Ambulatory Visit | Attending: Internal Medicine | Admitting: Internal Medicine

## 2021-09-03 ENCOUNTER — Encounter (HOSPITAL_BASED_OUTPATIENT_CLINIC_OR_DEPARTMENT_OTHER): Payer: Self-pay

## 2021-09-03 ENCOUNTER — Encounter: Payer: Self-pay | Admitting: Internal Medicine

## 2021-09-03 ENCOUNTER — Other Ambulatory Visit: Payer: Self-pay

## 2021-09-03 ENCOUNTER — Emergency Department (HOSPITAL_BASED_OUTPATIENT_CLINIC_OR_DEPARTMENT_OTHER): Payer: BC Managed Care – PPO | Admitting: Radiology

## 2021-09-03 ENCOUNTER — Emergency Department (HOSPITAL_BASED_OUTPATIENT_CLINIC_OR_DEPARTMENT_OTHER): Payer: BC Managed Care – PPO

## 2021-09-03 ENCOUNTER — Other Ambulatory Visit: Payer: Self-pay | Admitting: Internal Medicine

## 2021-09-03 ENCOUNTER — Emergency Department (HOSPITAL_BASED_OUTPATIENT_CLINIC_OR_DEPARTMENT_OTHER)
Admission: EM | Admit: 2021-09-03 | Discharge: 2021-09-03 | Disposition: A | Discharge status: Home / Self Care | Payer: BC Managed Care – PPO | Attending: Emergency Medicine | Admitting: Emergency Medicine

## 2021-09-03 ENCOUNTER — Ambulatory Visit (INDEPENDENT_AMBULATORY_CARE_PROVIDER_SITE_OTHER): Payer: BC Managed Care – PPO | Admitting: Internal Medicine

## 2021-09-03 VITALS — BP 140/88 | HR 94 | Temp 97.9°F | Resp 20 | Ht 72.0 in | Wt 369.0 lb

## 2021-09-03 DIAGNOSIS — R0689 Other abnormalities of breathing: Secondary | ICD-10-CM

## 2021-09-03 DIAGNOSIS — R042 Hemoptysis: Secondary | ICD-10-CM | POA: Diagnosis not present

## 2021-09-03 DIAGNOSIS — R079 Chest pain, unspecified: Secondary | ICD-10-CM | POA: Diagnosis not present

## 2021-09-03 DIAGNOSIS — R0602 Shortness of breath: Secondary | ICD-10-CM | POA: Diagnosis not present

## 2021-09-03 DIAGNOSIS — R7989 Other specified abnormal findings of blood chemistry: Secondary | ICD-10-CM | POA: Diagnosis not present

## 2021-09-03 DIAGNOSIS — J189 Pneumonia, unspecified organism: Secondary | ICD-10-CM | POA: Insufficient documentation

## 2021-09-03 DIAGNOSIS — R0603 Acute respiratory distress: Secondary | ICD-10-CM

## 2021-09-03 LAB — CBC
HCT: 46.5 % (ref 39.0–52.0)
Hemoglobin: 14.9 g/dL (ref 13.0–17.0)
MCH: 29.4 pg (ref 26.0–34.0)
MCHC: 32 g/dL (ref 30.0–36.0)
MCV: 91.9 fL (ref 80.0–100.0)
Platelets: 289 10*3/uL (ref 150–400)
RBC: 5.06 MIL/uL (ref 4.22–5.81)
RDW: 15 % (ref 11.5–15.5)
WBC: 14.3 10*3/uL — ABNORMAL HIGH (ref 4.0–10.5)
nRBC: 0 % (ref 0.0–0.2)

## 2021-09-03 LAB — TROPONIN I (HIGH SENSITIVITY)
Troponin I (High Sensitivity): 14 ng/L (ref <18)
Troponin I (High Sensitivity): 14 ng/L (ref <18)

## 2021-09-03 LAB — BASIC METABOLIC PANEL
Anion gap: 9 (ref 5–15)
BUN: 37 mg/dL — ABNORMAL HIGH (ref 6–20)
CO2: 30 mmol/L (ref 22–32)
Calcium: 10.3 mg/dL (ref 8.9–10.3)
Chloride: 102 mmol/L (ref 98–111)
Creatinine, Ser: 0.78 mg/dL (ref 0.61–1.24)
GFR, Estimated: >60 mL/min (ref >60)
Glucose, Bld: 144 mg/dL — ABNORMAL HIGH (ref 70–99)
Potassium: 4.1 mmol/L (ref 3.5–5.1)
Sodium: 141 mmol/L (ref 135–145)

## 2021-09-03 MED ORDER — LEVOFLOXACIN 750 MG PO TABS: 750.0000 mg | ORAL_TABLET | Freq: Every day | ORAL | 0 refills | Status: DC

## 2021-09-03 MED ORDER — IOHEXOL 350 MG/ML SOLN
100.0000 mL | Freq: Once | INTRAVENOUS | Status: AC | PRN
  Administered 2021-09-03: 100 mL via INTRAVENOUS

## 2021-09-03 MED ORDER — LEVOFLOXACIN 750 MG PO TABS
750.0000 mg | ORAL_TABLET | Freq: Once | ORAL | Status: AC
Start: 2021-09-03 — End: 2021-09-03
  Administered 2021-09-03: 750 mg via ORAL
  Filled 2021-09-03: qty 1

## 2021-09-03 NOTE — ED Notes (Signed)
PATIENT PLACED ON o2 AT 2L/MIN FOR SATS OF 92-93% AND COMFORT ?

## 2021-09-03 NOTE — ED Notes (Signed)
Called x 1 no answer

## 2021-09-03 NOTE — ED Triage Notes (Addendum)
Pt presents POV per PCP, sent to [facility] to r/o possible PE d/t increase SOB that worsens with exertion, coughing up blood and fatigue. Pt reports he had a temperature at his PCP's office today. Symptoms have been ongoing since having Covid in January but have gotten progressively worse over the last few days.  ? ?Pt reports he has taken 10 rounds of ABX since Jan. Pt also reports chronic low back pain  ?

## 2021-09-03 NOTE — Progress Notes (Signed)
? ? ?Future Appointments  ?Date Time Provider Department  ?11/12/2021  3:45 PM [name], MD [facility]  ?11/13/2021  3:30 PM Liane Comber, NP [facility]  ?02/18/2022  3:00 PM [name], MD [facility]  ? ? ?History of Present Illness: ? ?               Patient is a nice 61 yo single WM with  HTN, HLD, Morbid Obesity (BMI 50+),  T2_DM, Polypharmacy,  Low T, Allergic Asthma /Restrictive Lung Disease  (on O2) who has been treated recently with multiple courses of antibiotics for exacerbations of Bronchitis who presents today emergently with  ? ?     2 day hx/o worsening dyspnea  & moderate volume hemoptysis (evidenced pictures on his phone)  and left sided lower chest & back pain. He doesn't wear his O2 into the office and reports worsening dyspnea with minimal activity. He also reports several day hx/o intermittent fever spikes to 101 deg and sweats. CXR done earlier today at [facility]  showed no infiltrates or effusions.  ? ?Medications ? ?Current Outpatient Medications (Endocrine & Metabolic):  ?  dexamethasone (DECADRON) 4 MG tablet, Take 1 tab 3 x day - 3 days, then 2 x day - 3 days, then 1 tab daily ?  dexamethasone (DECADRON) 4 MG tablet, Take 1 tab 3 x day - 3 days, then 2 x day - 3 days, then 1 tab daily ?  metFORMIN (GLUCOPHAGE-XR) 500 MG 24 hr tablet, Takes 2 tablets  2 x /day  with Meals  for Diabetes ?  tirzepatide (MOUNJARO) 7.5 MG/0.5ML Pen, Inject 7.5 mg into the skin once a week. ? ?Current Outpatient Medications (Cardiovascular):  ?  bisoprolol-hydrochlorothiazide (ZIAC) 5-6.25 MG tablet, TAKE 1 TABLET BY MOUTH EVERY DAY FOR BLOOD PRESSURE ?  EPINEPHrine 0.3 mg/0.3 mL IJ SOAJ injection, Inject 0.3 mg into the muscle as needed. ?  furosemide (LASIX) 80 MG tablet, Take 1 tablet  2 x /day  for Fluid Retention / Leg swelling ?  pravastatin (PRAVACHOL) 40 MG tablet, TAKE 1 TABLET BY MOUTH AT BEDTIME FOR CHOLESTEROL ?  tadalafil (CIALIS) 5 MG tablet, Take 5 mg by mouth daily.   ? ?Current Outpatient Medications (Respiratory):  ?  Azelastine HCl 137 MCG/SPRAY SOLN, Place 2 sprays into both nostrils 2 (two) times daily. USE 1 TO 2 SPRAYS EACH NOSTRIL 2 X /DAY ?  benzonatate (TESSALON) 200 MG capsule, Take 1 capsule 3 x / day to Prevent Cough                                       /                           TAKE                              BY                 MOUTH ?  budesonide (PULMICORT) 0.5 MG/2ML nebulizer solution, Take 2 mLs (0.5 mg total) by nebulization 3 (three) times daily as needed. ?  cetirizine (ZYRTEC) 10 MG tablet, Take 1 tablet (10 mg total) by mouth 2 (two) times daily as needed for allergies (Can take an extra dose during flares). ?  fluticasone (FLONASE) 50 MCG/ACT nasal spray,  Place 2 sprays into both nostrils daily. Use 1 to 2 sprays each Nostril 2 x /day ?  Fluticasone-Umeclidin-Vilant (TRELEGY ELLIPTA) 100-62.5-25 MCG/ACT AEPB, Inhale 1 puff into the lungs daily. (Patient taking differently: Inhale 1 puff into the lungs daily. Takes 200) ?  Guaifenesin 1200 MG TB12, Take 1,200 mg by mouth 2 (two) times daily. ?  ipratropium-albuterol (DUONEB) 0.5-2.5 (3) MG/3ML SOLN, Take 3 mLs by nebulization every 4 (four) hours as needed. ?  montelukast (SINGULAIR) 10 MG tablet, TAKE 1 TABLET BY MOUTH DAILY FOR ALLERGIES ?  oxymetazoline (AFRIN) 0.05 % nasal spray, Place 1 spray into both nostrils at bedtime. ?  promethazine-dextromethorphan (PROMETHAZINE-DM) 6.25-15 MG/5ML syrup, Take  1 teaspoon (5 ml)  every 4 hours  as needed  for Cough                                   /                              TAKE            BY                MOUTH ?  sodium chloride (OCEAN) 0.65 % SOLN nasal spray, Place 1 spray into both nostrils 4 (four) times daily as needed for congestion. Uses each time before other nasal sprays ?  VENTOLIN HFA 108 (90 Base) MCG/ACT inhaler, INHALE 2 PUFFS BY MOUTH EVERY 4 HOURS AS NEEDED FOR WHEEZE OR FOR SHORTNESS OF BREATH ? ?Current Outpatient Medications  (Analgesics):  ?  acetaminophen (TYLENOL) 500 MG tablet, Take 1,000 mg by mouth every 6 (six) hours as needed for moderate pain. ?  Acetaminophen-Codeine 300-30 MG tablet, TAKE 1 TABLET EVERY 3 TO 4 HOURS AS NEEDED FOR SEVERE COUGH ?  celecoxib (CELEBREX) 200 MG capsule, TAKE 1 CAPSULE (200 MG TOTAL) BY MOUTH EVERY 12 (TWELVE) HOURS. ? ? ?Current Outpatient Medications (Other):  ?  anastrozole (ARIMIDEX) 1 MG tablet, Take 1 mg by mouth daily. ?  ascorbic acid (VITAMIN C) 1000 MG tablet, Take 1,000 mg by mouth every evening. ?  azithromycin (ZITHROMAX) 250 MG tablet, Take 2 tablets with Food on  Day 1, then 1 tablet Daily with Food for Sinusitis / Bronchitis ?  Botulinum Toxin Type A 200 units SOLR, Inject 200 Units into the skin every 3 (three) months. ?  cefUROXime (CEFTIN) 500 MG tablet, Take  1 tablet  2 x /day  with a Meal for Infection ?  cetaphil (CETAPHIL) lotion, Apply topically 2 (two) times daily. (Patient taking differently: Apply 1 application topically 2 (two) times daily.) ?  Cholecalciferol (VITAMIN D3) 125 MCG (5000 UT) TABS, Take 5 tablets (25,000 Units total) by mouth See admin instructions. Take 15000 in the morning and 10,000 in the evening ?  Cinnamon 500 MG capsule, Take 1,000 mg by mouth 3 (three) times daily. ?  clotrimazole-betamethasone (LOTRISONE) cream, APPLY TO AFFECTED AREA TWICE A DAY (Patient taking differently: Apply 1 application topically 2 (two) times daily as needed (rash).) ?  Coenzyme Q10 (CO Q 10 PO), Take 600 mg by mouth daily. ?  cyclobenzaprine (FLEXERIL) 10 MG tablet, Take  1/2 to 1 tablet  3 x day  only if need for muscle spasm (neck) ?  diclofenac Sodium (VOLTAREN) 1 % GEL, APPLY 2 TO 4 GRAMS TOPICALLY 2 TO 4  TIMES DAILY FOR PAIN & INFLAMMATION( OTC NOT COVERED) ?  donepezil (ARICEPT) 23 MG TABS tablet, Take 23 mg by mouth daily. ?  doxycycline (ADOXA) 100 MG tablet, Take 1 tablet (100 mg total) by mouth 2 (two) times daily. ?  dupilumab (DUPIXENT) 300 MG/2ML prefilled  syringe, Inject 300 mg into the skin every 14 (fourteen) days. ?  Eluxadoline 100 MG TABS, Take 100 mg by mouth 2 (two) times daily.  ?  Krill Oil 500 MG CAPS, Take 500 mg by mouth at bedtime. ?  Magnesium 500 MG CAPS, Take 500 mg by mouth daily with supper. ?  memantine (NAMENDA) 10 MG tablet, Take 10 mg by mouth 2 (two) times daily. ?  Misc Natural Products (GLUCOS-CHONDROIT-MSM COMPLEX) TABS, Take 2 tablets by mouth 3 (three) times daily. ?  Multiple Minerals-Vitamins (CITRACAL MAXIMUM PLUS) TABS, Take 1 tablet by mouth 2 (two) times daily. Vitamin D ?  Multiple Vitamins-Minerals (MULTIVITAMIN WITH MINERALS) tablet, Take 1 tablet by mouth daily. ?  Multiple Vitamins-Minerals (PRESERVISION AREDS 2) CAPS, Take 1 capsule by mouth 2 (two) times daily. ?  ondansetron (ZOFRAN-ODT) 4 MG disintegrating tablet, TAKE 1 TABLET (4 MG TOTAL) BY MOUTH EVERY 6 (SIX) HOURS AS NEEDED FOR NAUSEA OR VOMITING. ?  pentosan polysulfate (ELMIRON) 100 MG capsule, Take 100 mg by mouth 2 (two) times daily. ?  potassium chloride SA (KLOR-CON M20) 20 MEQ tablet, TAKE 1 TABLET TWICE DAILY FOR POTASSIUM ?  pregabalin (LYRICA) 150 MG capsule, TAKE 1 CAPSULE BY MOUTH THREE TIMES A DAY ?  PROBIOTIC PRODUCT PO, Take 1 capsule by mouth 2 (two) times daily. VSL ?  sertraline (ZOLOFT) 50 MG tablet, Take 50 mg by mouth 2 (two) times daily.  ?  trospium (SANCTURA) 20 MG tablet, Take 20 mg by mouth 2 (two) times daily. In the evening and at bedtime ?  TURMERIC PO, Take 1,160 mg by mouth 3 (three) times daily. ?  zinc gluconate 50 MG tablet, Take 50 mg by mouth 3 (three) times daily. ? ?Problem list ?He has Hypertension; Hyperlipidemia, mixed; BPH (benign prostatic hyperplasia); Testosterone Deficiency; IBS (irritable bowel syndrome); Partial complex seizure disorder with intractable epilepsy ([place]); Depression, major, recurrent, in partial remission ([place]); Asthma-COPD overlap syndrome ([place]); Diverticula of colon; Positive TB test; Vitamin D deficiency;  Prediabetes; GERD (gastroesophageal reflux disease); Medication management; SDAT; OSA and COPD overlap syndrome ([place]); Morbid obesity with BMI of 45.0-49.9, adult ([place]); Polypharmacy; Recurrent infections; Chronic n

## 2021-09-03 NOTE — ED Notes (Signed)
Pt verbalizes understanding of discharge instructions. Opportunity for questioning and answers were provided. Pt discharged from ED to home.   ? ?

## 2021-09-03 NOTE — Discharge Instructions (Addendum)
You are seen in the ER for cough with bloody phlegm. ?As discussed, CT scan is negative for blood clot but there is evidence of inflammatory changes that could be infectious in nature. ? ?We will start on antibiotics for presumed pneumonia ?Follow-up with your primary care doctor or your pulmonologist in 1 week.  Make sure that you are using inhaler every 4-6 hours as needed. ?

## 2021-09-03 NOTE — ED Provider Notes (Addendum)
?Mark Harrell ?Provider Note ? ? ?CSN: [id number] ?Arrival date & time: 09/03/21  1801 ? ?  ? ?History ? ?Chief Complaint  ?Patient presents with  ? Cough  ? Shortness of Breath  ? ? ?Mark Harrell. is a 61 y.o. male. ? ?HPI ? ?  ? ?61 year old male comes in with chief complaint of cough and shortness of breath ? ?Patient indicates that he was diagnosed with COVID-19 in January.  Ever since then, he has had a rough going with his respiratory illness.  He has gone through multiple rounds of antibiotics, and is seeing a pulmonologist.  More recently he started having worsening cough and is having bloody phlegm, which prompted his PCP to send him to the ER for further evaluation and to rule out DVT. ? ?Review of system is negative for fevers.  Patient is having pleuritic pain with a cough.  He denies any new wheezing, he has been using nebulizer treatment at home.  States that the cough has worsened more recently, and the bloody phlegm has progressed over the course of 24 hours.  Patient does not have any tobacco use disorder. ? ?Home Medications ?Prior to Admission medications   ?Medication Sig Start Date End Date Taking? Authorizing Provider  ?levofloxacin (LEVAQUIN) 750 MG tablet Take 1 tablet (750 mg total) by mouth at bedtime. 09/03/21  Yes Varney Biles, MD  ?acetaminophen (TYLENOL) 500 MG tablet Take 1,000 mg by mouth every 6 (six) hours as needed for moderate pain.    [provider]  ?Acetaminophen-Codeine 300-30 MG tablet TAKE 1 TABLET EVERY 3 TO 4 HOURS AS NEEDED FOR SEVERE COUGH 07/29/21   [name], MD  ?anastrozole (ARIMIDEX) 1 MG tablet Take 1 mg by mouth daily. 12/07/19   [provider]  ?ascorbic acid (VITAMIN C) 1000 MG tablet Take 1,000 mg by mouth every evening.    [provider]  ?Azelastine HCl 137 MCG/SPRAY SOLN Place 2 sprays into both nostrils 2 (two) times daily. USE 1 TO 2 SPRAYS EACH NOSTRIL 2 X /DAY 04/11/21   [name], MD  ?benzonatate (TESSALON) 200 MG capsule Take 1 capsule 3 x / day to Prevent Cough                                       /                           TAKE                              BY                 MOUTH 08/22/21   [name], MD  ?bisoprolol-hydrochlorothiazide [facility]) 5-6.25 MG tablet TAKE 1 TABLET BY MOUTH EVERY DAY FOR BLOOD PRESSURE 06/19/21   [name], MD  ?Botulinum Toxin Type A 200 units SOLR Inject 200 Units into the skin every 3 (three) months. 09/07/14   [provider]  ?budesonide (PULMICORT) 0.5 MG/2ML nebulizer solution Take 2 mLs (0.5 mg total) by nebulization 3 (three) times daily as needed. 07/16/21   [name], MD  ?celecoxib (CELEBREX) 200 MG capsule TAKE 1 CAPSULE (200 MG TOTAL) BY MOUTH EVERY 12 (TWELVE) HOURS. 07/03/21   Jessy Oto, MD  ?cetaphil (CETAPHIL) lotion  Apply topically 2 (two) times daily. ?Patient taking differently: Apply 1 application. topically 2 (two) times daily. 06/09/16   Lavina Hamman, MD  ?cetirizine (ZYRTEC) 10 MG tablet Take 1 tablet (10 mg total) by mouth 2 (two) times daily as needed for allergies (Can take an extra dose during flares). 04/11/21   [name], MD  ?Cholecalciferol (VITAMIN D3) 125 MCG (5000 UT) TABS Take 5 tablets (25,000 Units total) by mouth See admin instructions. Take 15000 in the morning and 10,000 in the evening    [provider]  ?Cinnamon 500 MG capsule Take 1,000 mg by mouth 3 (three) times daily.    [provider]  ?clotrimazole-betamethasone (LOTRISONE) cream APPLY TO AFFECTED AREA TWICE A DAY ?Patient taking differently: Apply 1 application. topically 2 (two) times daily as needed (rash). 05/27/17   Vladimir Crofts, PA-C  ?Coenzyme Q10 (CO Q 10 PO) Take 600 mg by mouth daily.    [provider]  ?cyclobenzaprine (FLEXERIL) 10 MG tablet Take  1/2 to 1 tablet  3 x day  only if need for muscle spasm (neck) 08/11/21   [name], MD  ?dexamethasone  (DECADRON) 4 MG tablet Take 1 tab 3 x day - 3 days, then 2 x day - 3 days, then 1 tab daily 08/08/21   [name], MD  ?dexamethasone (DECADRON) 4 MG tablet Take 1 tab 3 x day - 3 days, then 2 x day - 3 days, then 1 tab daily 08/22/21   [name], MD  ?diclofenac Sodium (VOLTAREN) 1 % GEL APPLY 2 TO 4 GRAMS TOPICALLY 2 TO 4 TIMES DAILY FOR PAIN & INFLAMMATION( OTC NOT COVERED) 05/28/21   Liane Comber, NP  ?donepezil (ARICEPT) 23 MG TABS tablet Take 23 mg by mouth daily.    [provider]  ?dupilumab (DUPIXENT) 300 MG/2ML prefilled syringe Inject 300 mg into the skin every 14 (fourteen) days. 03/28/21   [name], MD  ?Eluxadoline 100 MG TABS Take 100 mg by mouth 2 (two) times daily.     [provider]  ?EPINEPHrine 0.3 mg/0.3 mL IJ SOAJ injection Inject 0.3 mg into the muscle as needed. 04/11/21   [name], MD  ?fluticasone [facility]) 50 MCG/ACT nasal spray Place 2 sprays into both nostrils daily. Use 1 to 2 sprays each Nostril 2 x /day 04/11/21   [name], MD  ?Fluticasone-Umeclidin-Vilant (TRELEGY ELLIPTA) 100-62.5-25 MCG/ACT AEPB Inhale 1 puff into the lungs daily. ?Patient taking differently: Inhale 1 puff into the lungs daily. Takes 200 04/11/21   [name], MD  ?furosemide (LASIX) 80 MG tablet Take 1 tablet  2 x /day  for Fluid Retention / Leg swelling 07/26/21   [name], MD  ?Guaifenesin 1200 MG TB12 Take 1,200 mg by mouth 2 (two) times daily.    [provider]  ?ipratropium-albuterol (DUONEB) 0.5-2.5 (3) MG/3ML SOLN Take 3 mLs by nebulization every 4 (four) hours as needed. 04/11/21   [name], MD  ?Javier Docker Oil 500 MG CAPS Take 500 mg by mouth at bedtime.    [provider]  ?Magnesium 500 MG CAPS Take 500 mg by mouth daily with supper.    [provider]  ?memantine (NAMENDA) 10 MG tablet Take 10 mg by mouth 2 (two) times daily.    [provider]  ?metFORMIN (GLUCOPHAGE-XR)  500 MG 24 hr tablet Takes 2 tablets  2 x /day  with Meals  for Diabetes 03/31/21   Mark Harrell,  Mark Saxon, MD  ?Misc Natural Products (GLUCOS-CHONDROIT-MSM COMPLEX) TABS Take 2 tablets by mouth 3 (three) times daily.    [provider]  ?montelukast (SINGULAIR) 10 MG tablet TAKE 1 TABLET BY MOUTH DAILY FOR ALLERGIES 04/11/21   [name], MD  ?Multiple Minerals-Vitamins (CITRACAL MAXIMUM PLUS) TABS Take 1 tablet by mouth 2 (two) times daily. Vitamin D    [provider]  ?Multiple Vitamins-Minerals (MULTIVITAMIN WITH MINERALS) tablet Take 1 tablet by mouth daily.    [provider]  ?Multiple Vitamins-Minerals (PRESERVISION AREDS 2) CAPS Take 1 capsule by mouth 2 (two) times daily.    [provider]  ?ondansetron (ZOFRAN-ODT) 4 MG disintegrating tablet TAKE 1 TABLET (4 MG TOTAL) BY MOUTH EVERY 6 (SIX) HOURS AS NEEDED FOR NAUSEA OR VOMITING. 03/20/17   [name], MD  ?oxymetazoline (AFRIN) 0.05 % nasal spray Place 1 spray into both nostrils at bedtime.    [provider]  ?pentosan polysulfate (ELMIRON) 100 MG capsule Take 100 mg by mouth 2 (two) times daily.    [provider]  ?potassium chloride SA (KLOR-CON M20) 20 MEQ tablet TAKE 1 TABLET TWICE DAILY FOR POTASSIUM 06/19/21   [name], MD  ?pravastatin (PRAVACHOL) 40 MG tablet TAKE 1 TABLET BY MOUTH AT BEDTIME FOR CHOLESTEROL 05/01/21   Magda Bernheim, NP  ?pregabalin (LYRICA) 150 MG capsule TAKE 1 CAPSULE BY MOUTH THREE TIMES A DAY 05/28/21   Liane Comber, NP  ?PROBIOTIC PRODUCT PO Take 1 capsule by mouth 2 (two) times daily. VSL    [provider]  ?promethazine-dextromethorphan (PROMETHAZINE-DM) 6.25-15 MG/5ML syrup Take  1 teaspoon (5 ml)  every 4 hours  as needed  for Cough                                   /                              TAKE            BY                MOUTH 08/22/21   [name], MD  ?sertraline (ZOLOFT) 50 MG tablet Take 50 mg by mouth 2 (two) times  daily.     [provider]  ?sodium chloride (OCEAN) 0.65 % SOLN nasal spray Place 1 spray into both nostrils 4 (four) times daily as needed for congestion. Uses each time before other nasal sprays    Pro

## 2021-09-11 ENCOUNTER — Encounter: Payer: Self-pay | Admitting: Internal Medicine

## 2021-09-11 ENCOUNTER — Other Ambulatory Visit: Payer: Self-pay | Admitting: Internal Medicine

## 2021-09-11 DIAGNOSIS — J40 Bronchitis, not specified as acute or chronic: Secondary | ICD-10-CM

## 2021-09-11 MED ORDER — CEFUROXIME AXETIL 500 MG PO TABS: ORAL_TABLET | ORAL | 1 refills | Status: DC

## 2021-09-14 DIAGNOSIS — J9611 Chronic respiratory failure with hypoxia: Secondary | ICD-10-CM | POA: Diagnosis not present

## 2021-09-14 DIAGNOSIS — J449 Chronic obstructive pulmonary disease, unspecified: Secondary | ICD-10-CM | POA: Diagnosis not present

## 2021-09-17 ENCOUNTER — Ambulatory Visit: Payer: BC Managed Care – PPO | Admitting: Acute Care

## 2021-09-17 ENCOUNTER — Encounter: Payer: Self-pay | Admitting: Acute Care

## 2021-09-17 VITALS — BP 126/82 | HR 89 | Temp 98.3°F | Ht 72.0 in | Wt 385.0 lb

## 2021-09-17 DIAGNOSIS — J449 Chronic obstructive pulmonary disease, unspecified: Secondary | ICD-10-CM

## 2021-09-17 DIAGNOSIS — R911 Solitary pulmonary nodule: Secondary | ICD-10-CM

## 2021-09-17 DIAGNOSIS — J189 Pneumonia, unspecified organism: Secondary | ICD-10-CM

## 2021-09-17 DIAGNOSIS — U099 Post covid-19 condition, unspecified: Secondary | ICD-10-CM | POA: Diagnosis not present

## 2021-09-17 DIAGNOSIS — Z87891 Personal history of nicotine dependence: Secondary | ICD-10-CM

## 2021-09-17 DIAGNOSIS — R5381 Other malaise: Secondary | ICD-10-CM

## 2021-09-17 NOTE — Progress Notes (Signed)
? ?History of Present Illness ?Mark Harrell. is a 61 y.o. male former smoker ( Quit in 40's) with history of asthma-COPD overlap syndrome, obstructive sleep apnea and oxygen dependent respiratory failure. Previously on Nucala in 2018, switched to [place] 05/2017. He follows with asthma and immunology.  ?Medical history significant for chronic sinusitis followed by ENT with previous sinus surgery, had  a plasty and tonsillectomy ?Memory issues on Aricept and Namenda which he states are much worse since he had Covid 05/2021 ? ?He had Covid Pneumonia 05/2021, and has had shortness of breath and dyspnea since, recent diagnosis of pneumonia 08/2021 that has been slow to resolve.  ?He was last seen in office 10/2018.for asthma and COPD ?He states he has been vaccinated, but does not have his card with him. ?Last Dupizent  dose was a few weeks ago.  ? ?Followed initially by Dr. Ashok Cordia, then [facility].  ? ? ?09/17/2021 ?Pt. Presents for follow up after almost 3 years. He had Covid Pneumonia 05/2021, and has had shortness of breath and dyspnea since. He states he had a very hard time getting over his Covid. Ever since then, he has had a rough going with his respiratory illness.  He has gone through multiple rounds of antibiotics. 09/03/2021 he presented to the ED with  ?worsening cough and bloody phlegm, which prompted his PCP to send him to the ER for further evaluation and to rule out DVT.In the ED he had a CT Angio. It was + for nodular airspace opacities in the lower lobes ?bilaterally worse on the right than the left. A dominant 2.5 c  nodular area is seen. There was no PE. These changes may simply be related to sequelae from prior COVID infection although more aggressive process could not be totally excluded.He was treated for an acute pneumonia with Levaquin x 5 days. He didn't get better and went to his PCP on 09/11/2021  who started him on Ceftin 500 mg TID x 15 days. He said since taking the Ceftin  his doctor  prescribed he has less bloody nasal secretions and lung secretions have become less and more clear in color. . He states he is not wheezing. He does have some discolored secretions especially from his nose. These are better with treatment. He had fever earlier today. He is not wearing a mask.  ? ?He has been taking his Dupixent throughout his slow to resolve pneumonia. He does use nebulizer treatments  (Duonebs, Pulmicort,)  Albuterol inhaler as rescue and he is compliant with his Trelegy. He uses his rescue inhaler several times a day. He is using his Mucinex three times a day. He is here today without his oxygen as he states his tank is too heavy to carry. He is walking with a cane and is very deconditioned . He states he has had about 35 pounds of weight gain. He also has BLE edema, and he is using lasix for this as needed.This is being managed by his PCP.   ? ?Test Results: ?CT Chest 09/03/2021 ?IMPRESSION: ?Patchy somewhat nodular airspace opacities in the lower lobes ?bilaterally worse on the right than the left. A dominant 2.5 cm ?nodular area is seen. These changes may simply be related to ?sequelae from prior COVID infection although more aggressive process ?could not be totally excluded. Short-term follow-up in 3 months is ?recommended with chest CT ?  ?No evidence of pulmonary emboli. ?  ?No other focal abnormality is noted. ? ? ?  Latest Ref Rng &  Units 09/03/2021  ?  6:42 PM 07/18/2021  ?  3:44 PM 05/15/2021  ?  5:16 PM  ?CBC  ?WBC 4.0 - 10.5 K/uL 14.3   8.1   9.1    ?Hemoglobin 13.0 - 17.0 g/dL 14.9   15.6   16.3    ?Hematocrit 39.0 - 52.0 % 46.5   47.6   48.7    ?Platelets 150 - 400 K/uL 289   274   286    ? ? ? ?  Latest Ref Rng & Units 09/03/2021  ?  6:42 PM 07/18/2021  ?  3:44 PM 05/15/2021  ?  5:16 PM  ?BMP  ?Glucose 70 - 99 mg/dL 144   100   92    ?BUN 6 - 20 mg/dL 37   16   15    ?Creatinine 0.61 - 1.24 mg/dL 0.78   0.81   0.72    ?BUN/Creat Ratio 6 - 22 (calc)  NOT APPLICABLE   NOT APPLICABLE     ?Sodium 135 - 145 mmol/L 141   142   141    ?Potassium 3.5 - 5.1 mmol/L 4.1   4.6   4.1    ?Chloride 98 - 111 mmol/L 102   104   103    ?CO2 22 - 32 mmol/L '30   31   31    '$ ?Calcium 8.9 - 10.3 mg/dL 10.3   9.9   10.3    ? ? ?BNP ?   ?Component Value Date/Time  ? BNP 173 (H) 01/08/2017 1611  ? ? ?ProBNP ?   ?Component Value Date/Time  ? PROBNP 37.0 08/13/2017 1532  ? ? ?PFT ?   ?Component Value Date/Time  ? FEV1PRE 2.70 12/28/2017 1428  ? FEV1POST 2.69 12/28/2017 1428  ? FVCPRE 3.58 12/28/2017 1428  ? FVCPOST 3.41 12/28/2017 1428  ? TLC 5.84 12/28/2017 1428  ? [place] 33.07 12/28/2017 1428  ? PREFEV1FVCRT 75 12/28/2017 1428  ? PSTFEV1FVCRT 79 12/28/2017 1428  ? ? ?DG Chest 2 View ? ?Result Date: 09/03/2021 ?CLINICAL DATA:  Shortness of breath. EXAM: CHEST - 2 VIEW COMPARISON:  Chest two views 09/03/2021 earlier same day FINDINGS: Cardiac silhouette is again mildly enlarged. Mediastinal contours are within normal limits. The lungs are clear. No pleural effusion or pneumothorax. Mild-to-moderate multilevel degenerative disc changes of the thoracic spine. ACDF hardware overlies the lower cervical spine. IMPRESSION: No active cardiopulmonary disease. Electronically Signed   By: Yvonne Kendall M.D.   On: 09/03/2021 19:06  ? ?DG Chest 2 View ? ?Result Date: 09/03/2021 ?CLINICAL DATA:  Shortness of breath and hemoptysis for 2 days. Left lateral chest pain. EXAM: CHEST - 2 VIEW COMPARISON:  07/26/2021 FINDINGS: The heart is mildly enlarged but stable. The lungs are clear of an acute process. No definite infiltrates or effusions. The bony thorax is intact. IMPRESSION: 1. Stable cardiac enlargement. 2. No acute pulmonary findings. Electronically Signed   By: Marijo Sanes M.D.   On: 09/03/2021 13:40  ? ?CT Angio Chest PE W and/or Wo Contrast ? ?Result Date: 09/03/2021 ?CLINICAL DATA:  Increasing shortness of breath and positive D-dimer EXAM: CT ANGIOGRAPHY CHEST WITH CONTRAST TECHNIQUE: Multidetector CT imaging of the chest was  performed using the standard protocol during bolus administration of intravenous contrast. Multiplanar CT image reconstructions and MIPs were obtained to evaluate the vascular anatomy. RADIATION DOSE REDUCTION: This exam was performed according to the departmental dose-optimization program which includes automated exposure control, adjustment of the mA and/or kV according to  patient size and/or use of iterative reconstruction technique. CONTRAST:  122m OMNIPAQUE IOHEXOL 350 MG/ML SOLN COMPARISON:  X-ray from earlier in the same day. FINDINGS: Cardiovascular: Thoracic aorta demonstrates no aneurysmal dilatation or dissection. No significant atherosclerotic changes are noted. Heart is at the upper limits of normal in size. No coronary calcifications are noted. The pulmonary artery is well visualized within normal branching pattern. No filling defect to suggest pulmonary embolism is noted. Mediastinum/Nodes: Thoracic inlet is within normal limits. Mild prominent mediastinal lipomatosis is noted. Scattered small lymph nodes are noted without significant by size criteria. Esophagus as visualized is within normal limits. Lungs/Pleura: Lungs are well aerated bilaterally. Patchy somewhat nodular airspace opacities are noted in the lower lobes bilaterally. A dominant 2.5 cm somewhat nodular area is noted in the medial aspect of the left lower lobe posteriorly best seen on image number 114 of series 7. These changes may be related to sequelae from prior COVID infection given the clinical history although acute pneumonia cannot be excluded. No sizable effusion is noted. No pneumothorax is seen. Upper Abdomen: Visualized upper abdomen is unremarkable. Musculoskeletal: Degenerative changes of the thoracic spine are noted. No acute rib fracture is seen. Review of the MIP images confirms the above findings. IMPRESSION: Patchy somewhat nodular airspace opacities in the lower lobes bilaterally worse on the right than the left. A  dominant 2.5 cm nodular area is seen. These changes may simply be related to sequelae from prior COVID infection although more aggressive process could not be totally excluded. Short-term follow-up in 3 months is reco

## 2021-09-17 NOTE — Patient Instructions (Addendum)
It is good to see you today. ?Finish the [employer] your PCP has prescribed.  ?We will see you in 3 weeks with CXR prior to evaluate response of pneumonia to ABX.  ?Continue nebs and Trelegy as you have been doing. ?Flutter valve approximately every 30 minutes , 4 blows . ?Continue Mucinex, as this will thin secretions so they can be coughed up.  ?Wear oxygen at 2 L [place]. ?Saturation goals are > 88% at all times. ?We will need to repeat your CT Chest without contrast  in 3 months ( after 12/03/2021)  ?Consider HRCT to evaluate scarring post Covid.  ?Eval for Pulmonary rehab at follow up.  ?Make sure you let allergy know you are on antibiotics for pneumonia, as they may decide to hold a dose of Dupixent. ?Please contact office for sooner follow up if symptoms do not improve or worsen or seek emergency care    ?

## 2021-09-23 ENCOUNTER — Other Ambulatory Visit: Payer: Self-pay | Admitting: Specialist

## 2021-09-23 ENCOUNTER — Other Ambulatory Visit: Payer: Self-pay | Admitting: Allergy & Immunology

## 2021-09-23 ENCOUNTER — Other Ambulatory Visit: Payer: Self-pay | Admitting: Adult Health

## 2021-09-24 ENCOUNTER — Emergency Department (HOSPITAL_BASED_OUTPATIENT_CLINIC_OR_DEPARTMENT_OTHER): Payer: BC Managed Care – PPO

## 2021-09-24 ENCOUNTER — Encounter (HOSPITAL_BASED_OUTPATIENT_CLINIC_OR_DEPARTMENT_OTHER): Payer: Self-pay

## 2021-09-24 ENCOUNTER — Inpatient Hospital Stay (HOSPITAL_BASED_OUTPATIENT_CLINIC_OR_DEPARTMENT_OTHER)
Admission: EM | Admit: 2021-09-24 | Discharge: 2021-09-27 | DRG: 291 | Disposition: A | Discharge status: Home Health | Payer: BC Managed Care – PPO | Attending: Internal Medicine | Admitting: Internal Medicine

## 2021-09-24 ENCOUNTER — Other Ambulatory Visit: Payer: Self-pay

## 2021-09-24 ENCOUNTER — Emergency Department (HOSPITAL_BASED_OUTPATIENT_CLINIC_OR_DEPARTMENT_OTHER): Payer: BC Managed Care – PPO | Admitting: Radiology

## 2021-09-24 DIAGNOSIS — Z8042 Family history of malignant neoplasm of prostate: Secondary | ICD-10-CM

## 2021-09-24 DIAGNOSIS — Z20822 Contact with and (suspected) exposure to covid-19: Secondary | ICD-10-CM | POA: Diagnosis not present

## 2021-09-24 DIAGNOSIS — R059 Cough, unspecified: Secondary | ICD-10-CM | POA: Diagnosis not present

## 2021-09-24 DIAGNOSIS — E782 Mixed hyperlipidemia: Secondary | ICD-10-CM | POA: Diagnosis not present

## 2021-09-24 DIAGNOSIS — I82461 Acute embolism and thrombosis of right calf muscular vein: Secondary | ICD-10-CM | POA: Diagnosis present

## 2021-09-24 DIAGNOSIS — I10 Essential (primary) hypertension: Secondary | ICD-10-CM | POA: Diagnosis present

## 2021-09-24 DIAGNOSIS — R7303 Prediabetes: Secondary | ICD-10-CM | POA: Diagnosis present

## 2021-09-24 DIAGNOSIS — E876 Hypokalemia: Secondary | ICD-10-CM | POA: Diagnosis present

## 2021-09-24 DIAGNOSIS — Z833 Family history of diabetes mellitus: Secondary | ICD-10-CM | POA: Diagnosis not present

## 2021-09-24 DIAGNOSIS — G4733 Obstructive sleep apnea (adult) (pediatric): Secondary | ICD-10-CM | POA: Diagnosis present

## 2021-09-24 DIAGNOSIS — I872 Venous insufficiency (chronic) (peripheral): Secondary | ICD-10-CM

## 2021-09-24 DIAGNOSIS — J449 Chronic obstructive pulmonary disease, unspecified: Secondary | ICD-10-CM | POA: Diagnosis not present

## 2021-09-24 DIAGNOSIS — K580 Irritable bowel syndrome with diarrhea: Secondary | ICD-10-CM | POA: Diagnosis not present

## 2021-09-24 DIAGNOSIS — Z8701 Personal history of pneumonia (recurrent): Secondary | ICD-10-CM | POA: Diagnosis not present

## 2021-09-24 DIAGNOSIS — Z981 Arthrodesis status: Secondary | ICD-10-CM

## 2021-09-24 DIAGNOSIS — F418 Other specified anxiety disorders: Secondary | ICD-10-CM | POA: Diagnosis not present

## 2021-09-24 DIAGNOSIS — Z87891 Personal history of nicotine dependence: Secondary | ICD-10-CM

## 2021-09-24 DIAGNOSIS — I5031 Acute diastolic (congestive) heart failure: Secondary | ICD-10-CM | POA: Diagnosis not present

## 2021-09-24 DIAGNOSIS — R0602 Shortness of breath: Secondary | ICD-10-CM | POA: Diagnosis not present

## 2021-09-24 DIAGNOSIS — F32A Depression, unspecified: Secondary | ICD-10-CM | POA: Diagnosis not present

## 2021-09-24 DIAGNOSIS — R600 Localized edema: Secondary | ICD-10-CM | POA: Diagnosis not present

## 2021-09-24 DIAGNOSIS — I82451 Acute embolism and thrombosis of right peroneal vein: Secondary | ICD-10-CM | POA: Diagnosis not present

## 2021-09-24 DIAGNOSIS — R918 Other nonspecific abnormal finding of lung field: Secondary | ICD-10-CM | POA: Diagnosis present

## 2021-09-24 DIAGNOSIS — Z79899 Other long term (current) drug therapy: Secondary | ICD-10-CM

## 2021-09-24 DIAGNOSIS — Z91030 Bee allergy status: Secondary | ICD-10-CM

## 2021-09-24 DIAGNOSIS — G43909 Migraine, unspecified, not intractable, without status migrainosus: Secondary | ICD-10-CM | POA: Diagnosis present

## 2021-09-24 DIAGNOSIS — U099 Post covid-19 condition, unspecified: Secondary | ICD-10-CM | POA: Diagnosis not present

## 2021-09-24 DIAGNOSIS — R609 Edema, unspecified: Secondary | ICD-10-CM

## 2021-09-24 DIAGNOSIS — Z888 Allergy status to other drugs, medicaments and biological substances status: Secondary | ICD-10-CM

## 2021-09-24 DIAGNOSIS — F419 Anxiety disorder, unspecified: Secondary | ICD-10-CM | POA: Diagnosis present

## 2021-09-24 DIAGNOSIS — J45909 Unspecified asthma, uncomplicated: Secondary | ICD-10-CM | POA: Diagnosis not present

## 2021-09-24 DIAGNOSIS — I82401 Acute embolism and thrombosis of unspecified deep veins of right lower extremity: Secondary | ICD-10-CM | POA: Diagnosis present

## 2021-09-24 DIAGNOSIS — K219 Gastro-esophageal reflux disease without esophagitis: Secondary | ICD-10-CM | POA: Diagnosis present

## 2021-09-24 DIAGNOSIS — Z8249 Family history of ischemic heart disease and other diseases of the circulatory system: Secondary | ICD-10-CM | POA: Diagnosis not present

## 2021-09-24 DIAGNOSIS — N401 Enlarged prostate with lower urinary tract symptoms: Secondary | ICD-10-CM | POA: Diagnosis not present

## 2021-09-24 DIAGNOSIS — I110 Hypertensive heart disease with heart failure: Principal | ICD-10-CM | POA: Diagnosis present

## 2021-09-24 DIAGNOSIS — Z807 Family history of other malignant neoplasms of lymphoid, hematopoietic and related tissues: Secondary | ICD-10-CM | POA: Diagnosis not present

## 2021-09-24 DIAGNOSIS — J9611 Chronic respiratory failure with hypoxia: Secondary | ICD-10-CM | POA: Diagnosis not present

## 2021-09-24 DIAGNOSIS — Z9981 Dependence on supplemental oxygen: Secondary | ICD-10-CM | POA: Diagnosis not present

## 2021-09-24 DIAGNOSIS — Z7984 Long term (current) use of oral hypoglycemic drugs: Secondary | ICD-10-CM

## 2021-09-24 DIAGNOSIS — J9811 Atelectasis: Secondary | ICD-10-CM | POA: Diagnosis not present

## 2021-09-24 DIAGNOSIS — R0600 Dyspnea, unspecified: Secondary | ICD-10-CM | POA: Diagnosis present

## 2021-09-24 DIAGNOSIS — J9621 Acute and chronic respiratory failure with hypoxia: Secondary | ICD-10-CM | POA: Diagnosis present

## 2021-09-24 DIAGNOSIS — Z7951 Long term (current) use of inhaled steroids: Secondary | ICD-10-CM

## 2021-09-24 DIAGNOSIS — I5030 Unspecified diastolic (congestive) heart failure: Secondary | ICD-10-CM | POA: Diagnosis not present

## 2021-09-24 DIAGNOSIS — J811 Chronic pulmonary edema: Secondary | ICD-10-CM | POA: Diagnosis not present

## 2021-09-24 LAB — COMPREHENSIVE METABOLIC PANEL
ALT: 17 U/L (ref 0–44)
AST: 17 U/L (ref 15–41)
Albumin: 3.6 g/dL (ref 3.5–5.0)
Alkaline Phosphatase: 55 U/L (ref 38–126)
Anion gap: 10 (ref 5–15)
BUN: 20 mg/dL (ref 6–20)
CO2: 30 mmol/L (ref 22–32)
Calcium: 10.2 mg/dL (ref 8.9–10.3)
Chloride: 101 mmol/L (ref 98–111)
Creatinine, Ser: 0.81 mg/dL (ref 0.61–1.24)
GFR, Estimated: >60 mL/min (ref >60)
Glucose, Bld: 94 mg/dL (ref 70–99)
Potassium: 4.2 mmol/L (ref 3.5–5.1)
Sodium: 141 mmol/L (ref 135–145)
Total Bilirubin: 1.1 mg/dL (ref 0.3–1.2)
Total Protein: 6.1 g/dL — ABNORMAL LOW (ref 6.5–8.1)

## 2021-09-24 LAB — CBC WITH DIFFERENTIAL/PLATELET
Abs Immature Granulocytes: 0.08 10*3/uL — ABNORMAL HIGH (ref 0.00–0.07)
Basophils Absolute: 0.1 10*3/uL (ref 0.0–0.1)
Basophils Relative: 1 %
Eosinophils Absolute: 0.1 10*3/uL (ref 0.0–0.5)
Eosinophils Relative: 1 %
HCT: 39.4 % (ref 39.0–52.0)
Hemoglobin: 12.3 g/dL — ABNORMAL LOW (ref 13.0–17.0)
Immature Granulocytes: 1 %
Lymphocytes Relative: 32 %
Lymphs Abs: 2 10*3/uL (ref 0.7–4.0)
MCH: 28.8 pg (ref 26.0–34.0)
MCHC: 31.2 g/dL (ref 30.0–36.0)
MCV: 92.3 fL (ref 80.0–100.0)
Monocytes Absolute: 1.1 10*3/uL — ABNORMAL HIGH (ref 0.1–1.0)
Monocytes Relative: 18 %
Neutro Abs: 2.9 10*3/uL (ref 1.7–7.7)
Neutrophils Relative %: 47 %
Platelets: 240 10*3/uL (ref 150–400)
RBC: 4.27 MIL/uL (ref 4.22–5.81)
RDW: 14.6 % (ref 11.5–15.5)
WBC: 6.2 10*3/uL (ref 4.0–10.5)
nRBC: 0 % (ref 0.0–0.2)

## 2021-09-24 LAB — TROPONIN I (HIGH SENSITIVITY)
Troponin I (High Sensitivity): 10 ng/L (ref <18)
Troponin I (High Sensitivity): 10 ng/L (ref <18)

## 2021-09-24 LAB — RESP PANEL BY RT-PCR (FLU A&B, COVID) ARPGX2
Influenza A by PCR: NEGATIVE
Influenza B by PCR: NEGATIVE
SARS Coronavirus 2 by RT PCR: NEGATIVE

## 2021-09-24 LAB — BRAIN NATRIURETIC PEPTIDE: B Natriuretic Peptide: 86.8 pg/mL (ref 0.0–100.0)

## 2021-09-24 LAB — MAGNESIUM: Magnesium: 1.8 mg/dL (ref 1.7–2.4)

## 2021-09-24 MED ORDER — MONTELUKAST SODIUM 10 MG PO TABS
10.0000 mg | ORAL_TABLET | Freq: Every day | ORAL | Status: DC
  Administered 2021-09-25 – 2021-09-26 (×3): 10 mg via ORAL
  Filled 2021-09-24 (×3): qty 1

## 2021-09-24 MED ORDER — SENNOSIDES-DOCUSATE SODIUM 8.6-50 MG PO TABS: 1.0000 | ORAL_TABLET | Freq: Every evening | ORAL | Status: DC | PRN

## 2021-09-24 MED ORDER — FLUTICASONE FUROATE-VILANTEROL 100-25 MCG/ACT IN AEPB
1.0000 | INHALATION_SPRAY | Freq: Every day | RESPIRATORY_TRACT | Status: DC
  Administered 2021-09-25 – 2021-09-27 (×3): 1 via RESPIRATORY_TRACT
  Filled 2021-09-24: qty 28

## 2021-09-24 MED ORDER — PREGABALIN 75 MG PO CAPS
150.0000 mg | ORAL_CAPSULE | Freq: Two times a day (BID) | ORAL | Status: DC
  Administered 2021-09-25 – 2021-09-27 (×6): 150 mg via ORAL
  Filled 2021-09-24 (×6): qty 2

## 2021-09-24 MED ORDER — BUDESONIDE 0.5 MG/2ML IN SUSP: 0.5000 mg | Freq: Two times a day (BID) | RESPIRATORY_TRACT | Status: DC

## 2021-09-24 MED ORDER — GUAIFENESIN ER 600 MG PO TB12
600.0000 mg | ORAL_TABLET | Freq: Two times a day (BID) | ORAL | Status: DC
  Administered 2021-09-25 (×2): 600 mg via ORAL
  Filled 2021-09-24 (×2): qty 1

## 2021-09-24 MED ORDER — BISOPROLOL-HYDROCHLOROTHIAZIDE 5-6.25 MG PO TABS
1.0000 | ORAL_TABLET | Freq: Every day | ORAL | Status: DC
  Administered 2021-09-25 – 2021-09-27 (×3): 1 via ORAL
  Filled 2021-09-24 (×3): qty 1

## 2021-09-24 MED ORDER — FUROSEMIDE 10 MG/ML IJ SOLN
40.0000 mg | Freq: Once | INTRAMUSCULAR | Status: AC
  Administered 2021-09-24: 40 mg via INTRAVENOUS
  Filled 2021-09-24: qty 4

## 2021-09-24 MED ORDER — ENOXAPARIN SODIUM 300 MG/3ML IJ SOLN
1.0000 mg/kg | Freq: Two times a day (BID) | INTRAMUSCULAR | Status: DC
  Administered 2021-09-25: 175 mg via SUBCUTANEOUS
  Filled 2021-09-24 (×2): qty 1.75

## 2021-09-24 MED ORDER — ONDANSETRON HCL 4 MG PO TABS: 4.0000 mg | ORAL_TABLET | Freq: Four times a day (QID) | ORAL | Status: DC | PRN

## 2021-09-24 MED ORDER — ACETAMINOPHEN 325 MG PO TABS
650.0000 mg | ORAL_TABLET | Freq: Four times a day (QID) | ORAL | Status: DC | PRN
  Administered 2021-09-25: 650 mg via ORAL
  Filled 2021-09-24: qty 2

## 2021-09-24 MED ORDER — SODIUM CHLORIDE 0.9 % IV SOLN
2.0000 g | INTRAVENOUS | Status: DC
  Administered 2021-09-25 – 2021-09-27 (×3): 2 g via INTRAVENOUS
  Filled 2021-09-24 (×3): qty 20

## 2021-09-24 MED ORDER — UMECLIDINIUM BROMIDE 62.5 MCG/ACT IN AEPB
1.0000 | INHALATION_SPRAY | Freq: Every day | RESPIRATORY_TRACT | Status: DC
Start: 2021-09-25 — End: 2021-09-27
  Administered 2021-09-25 – 2021-09-27 (×3): 1 via RESPIRATORY_TRACT
  Filled 2021-09-24: qty 7

## 2021-09-24 MED ORDER — ONDANSETRON HCL 4 MG/2ML IJ SOLN: 4.0000 mg | Freq: Four times a day (QID) | INTRAMUSCULAR | Status: DC | PRN

## 2021-09-24 MED ORDER — FUROSEMIDE 10 MG/ML IJ SOLN
60.0000 mg | Freq: Two times a day (BID) | INTRAMUSCULAR | Status: DC
  Administered 2021-09-25 – 2021-09-27 (×5): 60 mg via INTRAVENOUS
  Filled 2021-09-24 (×6): qty 6

## 2021-09-24 MED ORDER — PRAVASTATIN SODIUM 40 MG PO TABS
40.0000 mg | ORAL_TABLET | Freq: Every day | ORAL | Status: DC
  Administered 2021-09-25 – 2021-09-27 (×3): 40 mg via ORAL
  Filled 2021-09-24 (×3): qty 1

## 2021-09-24 MED ORDER — POTASSIUM CHLORIDE CRYS ER 20 MEQ PO TBCR
20.0000 meq | EXTENDED_RELEASE_TABLET | Freq: Every day | ORAL | Status: DC
  Administered 2021-09-25: 20 meq via ORAL
  Filled 2021-09-24: qty 1

## 2021-09-24 MED ORDER — IPRATROPIUM-ALBUTEROL 0.5-2.5 (3) MG/3ML IN SOLN: 3.0000 mL | RESPIRATORY_TRACT | Status: DC | PRN

## 2021-09-24 MED ORDER — SODIUM CHLORIDE 0.9% FLUSH
3.0000 mL | Freq: Two times a day (BID) | INTRAVENOUS | Status: DC
  Administered 2021-09-25 – 2021-09-27 (×6): 3 mL via INTRAVENOUS

## 2021-09-24 MED ORDER — SODIUM CHLORIDE 0.9 % IV SOLN
500.0000 mg | INTRAVENOUS | Status: DC
  Administered 2021-09-25 – 2021-09-27 (×3): 500 mg via INTRAVENOUS
  Filled 2021-09-24 (×3): qty 5

## 2021-09-24 MED ORDER — ALBUTEROL SULFATE (2.5 MG/3ML) 0.083% IN NEBU
3.0000 mL | INHALATION_SOLUTION | RESPIRATORY_TRACT | Status: DC | PRN
  Administered 2021-09-25 (×2): 3 mL via RESPIRATORY_TRACT
  Filled 2021-09-24 (×2): qty 3

## 2021-09-24 MED ORDER — SERTRALINE HCL 100 MG PO TABS
100.0000 mg | ORAL_TABLET | Freq: Two times a day (BID) | ORAL | Status: DC
  Administered 2021-09-25 – 2021-09-27 (×6): 100 mg via ORAL
  Filled 2021-09-24 (×6): qty 1

## 2021-09-24 MED ORDER — ACETAMINOPHEN 650 MG RE SUPP
650.0000 mg | Freq: Four times a day (QID) | RECTAL | Status: DC | PRN
Start: 2021-09-24 — End: 2021-09-27

## 2021-09-24 NOTE — Progress Notes (Signed)
  [facility] will assume care on arrival to accepting facility. Until arrival, care as per EDP. However, [facility] available 24/7 for questions and assistance.   Nursing staff please page [facility] Admits and Consults ([phone]) as soon as the patient arrives to the hospital.  Alper Guilmette, DO [facility]  

## 2021-09-24 NOTE — Progress Notes (Addendum)
Paged [place] pt arrived to 4E. ?

## 2021-09-24 NOTE — Assessment & Plan Note (Signed)
- 

## 2021-09-24 NOTE — Assessment & Plan Note (Signed)
Continue pravastatin 

## 2021-09-24 NOTE — ED Provider Notes (Signed)
[name] ?Provider Note ? ? ?CSN: [id number] ?Arrival date & time: 09/24/21  1556 ? ?  ? ?History ? ?Chief Complaint  ?Patient presents with  ? Shortness of Breath  ? ? ?Mark Harrell. is a 61 y.o. male with a past medical history of asthma/COPD overlap, physical deconditioning, long COVID, hypertension, who presents today for evaluation of shortness of breath. ?He was seen 4/25 by pulmonology. ?According to note by pulmonology NP patient had COVID-pneumonia in January of this year and has had worsening shortness of breath and dyspnea since.  He has had multiple rounds of antibiotics since his COVID diagnosis. ?He was seen in the emergency room on 09/03/2021 and at that point he had a CT angio showing airspace opacities in the lower lobes.  He was treated with Levaquin for 5 days, did not get better, and he was started on Ceftin on 4/19. ?Patient states that he has had extremity swelling of his bilateral lower extremities, he has been treated by his PCP with Lasix. ?Patient notes that he has difficulty with his oxygen cannula/concentrator reaching where he sleeps, the kitchen, in the bathroom and so frequently when he asked to ambulate he is out of reach of the oxygen. ?He states that due to his difficulties getting to the bathroom he stopped taking his Lasix about a week ago. ?He notes that since he developed COVID in January he has had a 35 pound weight gain with significant edema of his bilateral lower extremities. ? ?Chart review shows his last echo was in 2018. ? ?He follows with [facility], he is on trelegy and dupixent.  ? ?According to their note from his office visit on 03/14/2021 he has recurrent infections with isolated low IgE and a B-cell memory deficit on prophylactic doxycycline at that time. ? ?HPI ? ?  ? ?Home Medications ?Prior to Admission medications   ?Medication Sig Start Date End Date Taking? Authorizing Provider  ?ipratropium-albuterol (DUONEB)  0.5-2.5 (3) MG/3ML SOLN TAKE 3 MLS BY NEBULIZATION EVERY 4 (FOUR) HOURS AS NEEDED. 09/24/21   [name], MD  ?acetaminophen (TYLENOL) 500 MG tablet Take 1,000 mg by mouth every 6 (six) hours as needed for moderate pain.    [provider]  ?anastrozole (ARIMIDEX) 1 MG tablet Take 1 mg by mouth daily. 12/07/19   [provider]  ?ascorbic acid (VITAMIN C) 1000 MG tablet Take 1,000 mg by mouth every evening.    [provider]  ?Azelastine HCl 137 MCG/SPRAY SOLN Place 2 sprays into both nostrils 2 (two) times daily. USE 1 TO 2 SPRAYS EACH NOSTRIL 2 X /DAY 04/11/21   [name], MD  ?benzonatate (TESSALON) 200 MG capsule Take 1 capsule 3 x / day to Prevent Cough                                       /                           TAKE                              BY                 MOUTH 08/22/21   [name], MD  ?bisoprolol-hydrochlorothiazide [facility]) 5-6.25 MG  tablet TAKE 1 TABLET BY MOUTH EVERY DAY FOR BLOOD PRESSURE 06/19/21   [name], MD  ?Botulinum Toxin Type A 200 units SOLR Inject 200 Units into the skin every 3 (three) months. 09/07/14   [provider]  ?budesonide (PULMICORT) 0.5 MG/2ML nebulizer solution Take 2 mLs (0.5 mg total) by nebulization 3 (three) times daily as needed. 07/16/21   [name], MD  ?cefUROXime (CEFTIN) 500 MG tablet Take  1 tablet  2 x /day  (every 12 hours) with Food for Infection 09/11/21   [name], MD  ?celecoxib (CELEBREX) 200 MG capsule TAKE 1 CAPSULE (200 MG TOTAL) BY MOUTH EVERY 12 (TWELVE) HOURS. 09/23/21   Jessy Oto, MD  ?cetaphil (CETAPHIL) lotion Apply topically 2 (two) times daily. ?Patient taking differently: Apply 1 application. topically 2 (two) times daily. 06/09/16   Lavina Hamman, MD  ?cetirizine (ZYRTEC) 10 MG tablet Take 1 tablet (10 mg total) by mouth 2 (two) times daily as needed for allergies (Can take an extra dose during flares). 04/11/21   [name], MD   ?Cholecalciferol (VITAMIN D3) 125 MCG (5000 UT) TABS Take 5 tablets (25,000 Units total) by mouth See admin instructions. Take 15000 in the morning and 10,000 in the evening    [provider]  ?Cinnamon 500 MG capsule Take 1,000 mg by mouth 3 (three) times daily.    [provider]  ?clotrimazole-betamethasone (LOTRISONE) cream APPLY TO AFFECTED AREA TWICE A DAY ?Patient taking differently: Apply 1 application. topically 2 (two) times daily as needed (rash). 05/27/17   Vladimir Crofts, PA-C  ?Coenzyme Q10 (CO Q 10 PO) Take 600 mg by mouth daily.    [provider]  ?cyclobenzaprine (FLEXERIL) 10 MG tablet Take  1/2 to 1 tablet  3 x day  only if need for muscle spasm (neck) 08/11/21   [name], MD  ?diclofenac Sodium (VOLTAREN) 1 % GEL APPLY 2 TO 4 GRAMS TOPICALLY 2 TO 4 TIMES DAILY FOR PAIN & INFLAMMATION( OTC NOT COVERED) 05/28/21   Liane Comber, NP  ?donepezil (ARICEPT) 23 MG TABS tablet Take 23 mg by mouth daily.    [provider]  ?dupilumab (DUPIXENT) 300 MG/2ML prefilled syringe Inject 300 mg into the skin every 14 (fourteen) days. 03/28/21   [name], MD  ?Eluxadoline 100 MG TABS Take 100 mg by mouth 2 (two) times daily.     [provider]  ?EPINEPHrine 0.3 mg/0.3 mL IJ SOAJ injection Inject 0.3 mg into the muscle as needed. 04/11/21   [name], MD  ?fluticasone [facility]) 50 MCG/ACT nasal spray Place 2 sprays into both nostrils daily. Use 1 to 2 sprays each Nostril 2 x /day 04/11/21   [name], MD  ?Fluticasone-Umeclidin-Vilant (TRELEGY ELLIPTA) 100-62.5-25 MCG/ACT AEPB Inhale 1 puff into the lungs daily. ?Patient taking differently: Inhale 1 puff into the lungs daily. Takes 200 04/11/21   [name], MD  ?furosemide (LASIX) 80 MG tablet Take 1 tablet  2 x /day  for Fluid Retention / Leg swelling 07/26/21   [name], MD  ?Guaifenesin 1200 MG TB12 Take 1,200 mg by mouth 2 (two) times daily.     [provider]  ?Astrid Drafts 500 MG CAPS Take 500 mg by mouth at bedtime.    [provider]  ?Magnesium 500 MG CAPS Take 500 mg by mouth daily with supper.    [provider]  ?memantine (NAMENDA) 10 MG tablet Take 10 mg  by mouth 2 (two) times daily.    [provider]  ?metFORMIN (GLUCOPHAGE-XR) 500 MG 24 hr tablet Takes 2 tablets  2 x /day  with Meals  for Diabetes 03/31/21   [name], MD  ?Misc Natural Products (GLUCOS-CHONDROIT-MSM COMPLEX) TABS Take 2 tablets by mouth 3 (three) times daily.    [provider]  ?montelukast (SINGULAIR) 10 MG tablet TAKE 1 TABLET BY MOUTH DAILY FOR ALLERGIES 04/11/21   [name], MD  ?Multiple Minerals-Vitamins (CITRACAL MAXIMUM PLUS) TABS Take 1 tablet by mouth 2 (two) times daily. Vitamin D    [provider]  ?Multiple Vitamins-Minerals (MULTIVITAMIN WITH MINERALS) tablet Take 1 tablet by mouth daily.    [provider]  ?Multiple Vitamins-Minerals (PRESERVISION AREDS 2) CAPS Take 1 capsule by mouth 2 (two) times daily.    [provider]  ?ondansetron (ZOFRAN-ODT) 4 MG disintegrating tablet TAKE 1 TABLET (4 MG TOTAL) BY MOUTH EVERY 6 (SIX) HOURS AS NEEDED FOR NAUSEA OR VOMITING. 03/20/17   [name], MD  ?oxymetazoline (AFRIN) 0.05 % nasal spray Place 1 spray into both nostrils at bedtime.    [provider]  ?pentosan polysulfate (ELMIRON) 100 MG capsule Take 100 mg by mouth 2 (two) times daily.    [provider]  ?potassium chloride SA (KLOR-CON M20) 20 MEQ tablet TAKE 1 TABLET TWICE DAILY FOR POTASSIUM 06/19/21   [name], MD  ?pravastatin (PRAVACHOL) 40 MG tablet TAKE 1 TABLET BY MOUTH AT BEDTIME FOR CHOLESTEROL 05/01/21   Magda Bernheim, NP  ?pregabalin (LYRICA) 150 MG capsule Take  1 capsule  3 x /day for Chronic Pain                                          /                          TAKE                              BY               MOUTH 09/23/21    [name], MD  ?PROBIOTIC PRODUCT PO Take 1 capsule by mouth 2 (two) times daily. VSL    [provider]  ?promethazine-dextromethorphan (PROMETHAZINE-DM) 6.25-15 MG/5ML syrup Take  1 teaspoon (5 ml)

## 2021-09-24 NOTE — ED Notes (Signed)
Report was given to the [facility]. ?

## 2021-09-24 NOTE — Assessment & Plan Note (Signed)
Small nonocclusive thrombus seen within the right gastrocnemius vein on venous ultrasound.  Will start on full dose Lovenox for now and obtain CTA chest PE study as above. ?

## 2021-09-24 NOTE — Assessment & Plan Note (Signed)
Progressively worsening dyspnea likely multifactorial related to long COVID, retrocardiac opacity on CXR concerning for pneumonia, and anasarca suspect due to underlying CHF. ?-Start empiric IV ceftriaxone and azithromycin ?-Start IV Lasix 60 mg twice daily ?-Obtain CTA chest to rule out PE and assess retrocardiac opacity ?-Obtain echocardiogram ?-Check sputum culture, strep pneumonia and Legionella urinary antigens ?-Monitor strict I/O's and daily weights ?

## 2021-09-24 NOTE — Progress Notes (Signed)
ANTICOAGULATION CONSULT NOTE - Initial Consult ? ?Pharmacy Consult for enoxaparin ?Indication: VTE treatment ? ?Allergies  ?Allergen Reactions  ? Bee Venom Swelling  ? Duloxetine Shortness Of Breath  ?  Brought on asthma  ? Ppd [Tuberculin Purified Protein Derivative] Other (See Comments)  ?  +ppd NEG Quantferron Gold 3/13 (shows false positive)   ? Fenofibrate Other (See Comments)  ?  Back pain  ? Verapamil Other (See Comments)  ?  Back pain  ? Claritin [Loratadine] Other (See Comments)  ?  UNKNOWN   ? Levofloxacin Diarrhea  ? Other Diarrhea  ?  "Some antibiotics" cause diarrhea  ? ? ?Patient Measurements: ?Height: 6' (182.9 cm) ?Weight: (!) 174.6 kg (384 lb 14.8 oz) ?IBW/kg (Calculated) : 77.6 ?Heparin Dosing Weight:  ? ?Vital Signs: ?Temp: 99.7 ?F (37.6 ?C) (05/02 2215) ?Temp Source: Oral (05/02 2215) ?BP: 156/88 (05/02 2215) ?Pulse Rate: 102 (05/02 2215) ? ?Labs: ?Recent Labs  ?  09/24/21 ?1644 09/24/21 ?1844  ?HGB 12.3*  --   ?HCT 39.4  --   ?PLT 240  --   ?CREATININE 0.81  --   ?TROPONINIHS 10 10  ? ? ?Estimated Creatinine Clearance: 159.7 mL/min (by C-G formula based on SCr of 0.81 mg/dL). ? ? ?Medical History: ?Past Medical History:  ?Diagnosis Date  ? Anesthesia complication requiring reversal agent administration   ? ? from central apnea, very difficult to get off vent  ? Anxiety   ? Arthritis   ? osteo  ? Asthma   ? BPH (benign prostatic hyperplasia)   ? Complication of anesthesia   ? difficulty waking , they twlight me because of my respiratory problems "  ? Depression   ? Dyspnea   ? on exertion  ? Enlarged heart   ? Family history of adverse reaction to anesthesia   ? mother trouble waking up, and heart stopped  ? GERD (gastroesophageal reflux disease)   ? Headache   ? botox injections for headaches  ? Hyperlipidemia   ? Hypertension   ? Hypogonadism male   ? IBS (irritable bowel syndrome)   ? Memory difficulties   ? short term memories  ? Neuropathy   ? Obesity   ? On home oxygen therapy   ? on 2  liter  ? OSA (obstructive sleep apnea)   ? not using cpap  ? Paralysis ([place])   ? left hand - small  ? Pneumonia   ? Pre-diabetes   ? Prostatitis   ? ? ? ? ?Assessment: ? 61 y.o. male with medical history significant for COPD/asthma overlap syndrome, long COVID (05/2021) with chronic hypoxic respiratory failure on 2 L O2 via [place], HTN, HLD, depression/anxiety, memory issues, migraines, morbid obesity, OSA not using CPAP who presented to the ED for evaluation of shortness of breath. Found Right lower extremity venous ultrasound showed a small amount of nonocclusive thrombus within the right gastrocnemius vein. Pharmacy consulted to dose enoxaparin.  No prior AC noted ? ?Goal of Therapy:  ?Anti-Xa level 0.6-1 units/ml 4hrs after LMWH dose given ?Monitor platelets by anticoagulation protocol: Yes ?  ?Plan:  ?Lovenox '1mg'$ /kg ('175mg'$ ) SQ q12h ?Follow renal function  ?Anti-Xa level as needed ? ?Dolly Rias RPh ?09/24/2021, 11:54 PM ? ? ?

## 2021-09-24 NOTE — ED Triage Notes (Signed)
Patient here POV from Home. ? ?Endorses SOB for a few days that has been progressively worsening. SOB worsens with Exertion.  ? ?Subjective Fevers at Home. Mildly Productive Cough.  ? ?History of COPD and Asthma. Normally uses [facility] for Supplemental Oxygenation.  ? ?A&Ox4. GCS 15. BIB Wheelchair. ?

## 2021-09-24 NOTE — Assessment & Plan Note (Signed)
Breath sounds distant without active wheezing on admission. ?-Continue home Trelegy, Pulmicort, Singulair, DuoNebs and albuterol as needed ?

## 2021-09-24 NOTE — ED Notes (Signed)
Report was given to the Floor Rn. ?

## 2021-09-24 NOTE — Assessment & Plan Note (Signed)
Continue home sertraline 

## 2021-09-24 NOTE — Assessment & Plan Note (Signed)
Not using CPAP due to reported intolerance. 

## 2021-09-24 NOTE — H&P (Signed)
?History and Physical  ? ? ?Mark Harrell. MIW:[id number] DOB: 1960-10-20 DOA: 09/24/2021 ? ?PCP: Mark Pinto, MD  ?Patient coming from: Home ? ?I have personally briefly reviewed patient's old medical records in [place] ? ?Chief Complaint: Shortness of breath ? ?HPI: ?Mark Harrell. is a 61 y.o. male with medical history significant for COPD/asthma overlap syndrome, long COVID (05/2021) with chronic hypoxic respiratory failure on 2 L O2 via [place], HTN, HLD, depression/anxiety, memory issues, migraines, morbid obesity, OSA not using CPAP who presented to the ED for evaluation of shortness of breath. ? ?Patient recently seen in the ED 09/03/2021 at which time CTA chest showed airspace opacities in the lower lobes.  He was treated with Levaquin for 5 days.  Symptoms did not improve and he was started on Ceftin on 4/19. ? ?Patient states that he has had persistent cough productive of green sputum.  He has continued dyspnea, worse with minimal exertion.  He has had progressive lower extremity edema over the last 6 weeks.  He states that he was taking Lasix but had to stop as he was becoming too dyspneic to get back and forth from the bathroom when urinating.  He reports intermittent fevers. ? ?ED Course  Labs/Imaging on admission: I have personally reviewed following labs and imaging studies. ? ?Initial vitals showed BP 103/44, pulse 89, RR 20, temp 98.8 ?F, SPO2 93% on 3 L O2 via [place]. ? ?Labs showed WBC 6.2, hemoglobin 12.3, platelets 240,000, sodium 141, potassium 4.2, bicarb 30, BUN 20, creatinine 0.81, serum glucose 94, LFTs within normal limits, troponin 10x2, BNP 86.8, magnesium 1.8. ? ?SARS-CoV-2 and influenza PCR negative. ? ?2 view chest x-ray showed retrocardiac infiltrate. ? ?Right lower extremity venous ultrasound showed a small amount of nonocclusive thrombus within the right gastrocnemius vein. ? ?Patient was given IV Lasix 40 mg.  The hospitalist service was consulted to admit for further  evaluation and management. ? ?Review of Systems: All systems reviewed and are negative except as documented in history of present illness above. ? ? ?Past Medical History:  ?Diagnosis Date  ? Anesthesia complication requiring reversal agent administration   ? ? from central apnea, very difficult to get off vent  ? Anxiety   ? Arthritis   ? osteo  ? Asthma   ? BPH (benign prostatic hyperplasia)   ? Complication of anesthesia   ? difficulty waking , they twlight me because of my respiratory problems "  ? Depression   ? Dyspnea   ? on exertion  ? Enlarged heart   ? Family history of adverse reaction to anesthesia   ? mother trouble waking up, and heart stopped  ? GERD (gastroesophageal reflux disease)   ? Headache   ? botox injections for headaches  ? Hyperlipidemia   ? Hypertension   ? Hypogonadism male   ? IBS (irritable bowel syndrome)   ? Memory difficulties   ? short term memories  ? Neuropathy   ? Obesity   ? On home oxygen therapy   ? on 2 liter  ? OSA (obstructive sleep apnea)   ? not using cpap  ? Paralysis ([place])   ? left hand - small  ? Pneumonia   ? Pre-diabetes   ? Prostatitis   ? ? ?Past Surgical History:  ?Procedure Laterality Date  ? ABDOMINAL SURGERY    ? ANKLE FRACTURE SURGERY Right   ? ANTERIOR CERVICAL DECOMP/DISCECTOMY FUSION N/A 05/23/2019  ? Procedure: ANTERIOR CERVICAL DISCECTOMY FUSION  CERVICAL FIVE THROUGH CERVICAL SIX AND CERVICAL SIX THROUGH CERVICAL SEVEN;  Surgeon: Jessy Oto, MD;  Location: [facility];  Service: Orthopedics;  Laterality: N/A;  ? COLONOSCOPY    ? CYSTOSCOPY    ? [place]  ? KNEE ARTHROSCOPY WITH MEDIAL MENISECTOMY Left 01/02/2017  ? Procedure: LEFT KNEE ARTHROSCOPY WITH PARTIAL MEDIAL MENISCECTOMY;  Surgeon: Mcarthur Rossetti, MD;  Location: [facility];  Service: Orthopedics;  Laterality: Left;  ? POSTERIOR CERVICAL FUSION/FORAMINOTOMY N/A 08/20/2020  ? Procedure: LEFT CERVICAL SIX THROUGH SEVEN  AND CERVICAL SEVEN THROUGH THORACIC ONE  FORAMINOTOMIES WITH EXCISION OF [place]  HERNIATION LEFT CERVICAL SEVEN THROUGH THORACIC ONE;  Surgeon: Jessy Oto, MD;  Location: [facility];  Service: Orthopedics;  Laterality: N/A;  ? TONSILLECTOMY    ? [place] RESECTION  2007  ? UVULOPALATOPHARYNGOPLASTY    ? ? ?Social History: ? reports that he quit smoking about 38 years ago. His smoking use included cigarettes. He has a 1.50 pack-year smoking history. He has never been exposed to tobacco smoke. He has never used smokeless tobacco. He reports current alcohol use of about 1.0 standard drink per week. He reports that he does not use drugs. ? ?Allergies  ?Allergen Reactions  ? Bee Venom Swelling  ? Duloxetine Shortness Of Breath  ?  Brought on asthma  ? Ppd [Tuberculin Purified Protein Derivative] Other (See Comments)  ?  +ppd NEG Quantferron Gold 3/13 (shows false positive)   ? Fenofibrate Other (See Comments)  ?  Back pain  ? Verapamil Other (See Comments)  ?  Back pain  ? Claritin [Loratadine] Other (See Comments)  ?  UNKNOWN   ? Levofloxacin Diarrhea  ? Other Diarrhea  ?  "Some antibiotics" cause diarrhea  ? ? ?Family History  ?Problem Relation Age of Onset  ? Diabetes Paternal Uncle   ? Cancer Father   ?     lymphoma, colon  ? Diabetes Maternal Grandmother   ? Heart disease Maternal Grandfather   ? Diabetes Maternal Grandfather   ? Diabetes Paternal Grandmother   ? Diabetes Paternal Grandfather   ? Dementia Mother   ? Prostate cancer Maternal Uncle   ? Lung disease Neg Hx   ? Rheumatologic disease Neg Hx   ? ? ? ?Prior to Admission medications   ?Medication Sig Start Date End Date Taking? Authorizing Provider  ?ipratropium-albuterol (DUONEB) 0.5-2.5 (3) MG/3ML SOLN TAKE 3 MLS BY NEBULIZATION EVERY 4 (FOUR) HOURS AS NEEDED. 09/24/21   [name], MD  ?acetaminophen (TYLENOL) 500 MG tablet Take 1,000 mg by mouth every 6 (six) hours as needed for moderate pain.    [provider]  ?anastrozole (ARIMIDEX) 1 MG tablet Take 1 mg by mouth daily. 12/07/19   [provider]   ?ascorbic acid (VITAMIN C) 1000 MG tablet Take 1,000 mg by mouth every evening.    [provider]  ?Azelastine HCl 137 MCG/SPRAY SOLN Place 2 sprays into both nostrils 2 (two) times daily. USE 1 TO 2 SPRAYS EACH NOSTRIL 2 X /DAY 04/11/21   [name], MD  ?benzonatate (TESSALON) 200 MG capsule Take 1 capsule 3 x / day to Prevent Cough                                       /  TAKE                              BY                 MOUTH 08/22/21   Mark Pinto, MD  ?bisoprolol-hydrochlorothiazide [facility]) 5-6.25 MG tablet TAKE 1 TABLET BY MOUTH EVERY DAY FOR BLOOD PRESSURE 06/19/21   Mark Pinto, MD  ?Botulinum Toxin Type A 200 units SOLR Inject 200 Units into the skin every 3 (three) months. 09/07/14   [provider]  ?budesonide (PULMICORT) 0.5 MG/2ML nebulizer solution Take 2 mLs (0.5 mg total) by nebulization 3 (three) times daily as needed. 07/16/21   [name], MD  ?cefUROXime (CEFTIN) 500 MG tablet Take  1 tablet  2 x /day  (every 12 hours) with Food for Infection 09/11/21   Mark Pinto, MD  ?celecoxib (CELEBREX) 200 MG capsule TAKE 1 CAPSULE (200 MG TOTAL) BY MOUTH EVERY 12 (TWELVE) HOURS. 09/23/21   Jessy Oto, MD  ?cetaphil (CETAPHIL) lotion Apply topically 2 (two) times daily. ?Patient taking differently: Apply 1 application. topically 2 (two) times daily. 06/09/16   Lavina Hamman, MD  ?cetirizine (ZYRTEC) 10 MG tablet Take 1 tablet (10 mg total) by mouth 2 (two) times daily as needed for allergies (Can take an extra dose during flares). 04/11/21   [name], MD  ?Cholecalciferol (VITAMIN D3) 125 MCG (5000 UT) TABS Take 5 tablets (25,000 Units total) by mouth See admin instructions. Take 15000 in the morning and 10,000 in the evening    [provider]  ?Cinnamon 500 MG capsule Take 1,000 mg by mouth 3 (three) times daily.    [provider]  ?clotrimazole-betamethasone (LOTRISONE) cream APPLY TO  AFFECTED AREA TWICE A DAY ?Patient taking differently: Apply 1 application. topically 2 (two) times daily as needed (rash). 05/27/17   Vladimir Crofts, PA-C  ?Coenzyme Q10 (CO Q 10 PO) Take 600 mg by mouth daily.    Pr

## 2021-09-24 NOTE — Hospital Course (Addendum)
Mark Harrell. is a 61 y.o. male with past medical history significant for COPD/asthma overlap syndrome, long COVID (05/2021) with chronic hypoxic respiratory failure on 2 L O2 via [place], hypertension, hyperlipidemia depression/anxiety, memory issues, migraines, morbid obesity, OSA not using CPAP presented to hospital with progressive shortness of breath and dyspnea with increasing leg swelling.  Of note, patient was recently seen in the ED on 09/03/2021 and CT of the chest showed airspace opacities and was given Levaquin for 5 days.  Symptoms did not improve so he was restarted on Ceftin on 09/11/21.  Despite this, he continues to have persistent cough with productive sputum and dyspnea worse on exertion with progressive lower extremity edema.  Patient was on Lasix but had stopped it due to dyspnea from back-and-forth to the bathroom.  In the ED, patient was noted to have mild tachypnea with pulse ox of 93% on 3 L of oxygen by nasal cannula.  Creatinine was 0.8.  Hemoglobin at 12.3.  Troponin was negative.  BNP 86.  COVID and influenza was negative.  Chest x-ray showed retrocardiac infiltrate.  Ultrasound of the lower extremity showed nonocclusive thrombus in the right gastrocnemius vein.  Patient was given Lasix and was admitted to the hospital for further evaluation and treatment. ? ?Assessment and Plan: ?Principal Problem: ?  Shortness of breath ?Active Problems: ?  Acute deep vein thrombosis (DVT) of right lower extremity (Mark Harrell) ?  Asthma-COPD overlap syndrome ([name]) ?  Chronic respiratory failure with hypoxia (HCC) ?  Hypertension ?  Hyperlipidemia, mixed ?  Depression with anxiety ?  OSA (obstructive sleep apnea) ?  Dyspnea ?  Hypokalemia ?  ?Shortness of breath/dyspnea ?Progressively worsening dyspnea likely multifactorial related to long COVID, morbid obesity, history of sleep apnea, retrocardiac  pneumonia, and anasarca suspect due to underlying acute diastolic CHF.  Pt started on  IV Rocephin and Zithromax.  He  was diuresed with IV Lasix 60 mg twice daily with good diuresis.   CT angiogram of the chest showed moderate severity bilateral lower lobe airspace opacity but no mention of pulmonary embolism..  2D echocardiogram from 09/25/2021 showed elevation fraction of 70 to 75% with LVH, no regional wall motion abnormality, grade 1 diastolic dysfunction. Negative strep pneumonia antigen but  Legionella urinary antigen negative.  Procalcitonin was less than 0.10.  I think pt's abnormality on CT chest likely due to long covid and not from acute bacterial process. Will not be sending pt home with abd.  ? ?LE Edema ?Pt with LE edema. Pt states he has gained 30 lbs since Covid in jan 2023. Echo showed LVEF of 70%. No pulmonary hypertension. Pt thinks he was on decadron for covid. Could have lead to water retention. Pt diuresed with IV lasix 60 mg bid. Pt states he never used the 80 mg lasix bid he was prescribed.  Continue with lasix. Will need BMP, Magnesium level in PCP office to monitor diuresis/Scr. ? ?Mild hypokalemia.  This was replete during hospital stay. Pt will go home with 40 meq bid of potassium due to lasix 60 mg bid.  ?  ?Acute deep vein thrombosis (DVT) of right lower extremity (Mark Harrell) ?Small nonocclusive thrombus seen within the right gastrocnemius vein on venous ultrasound.  Initially On Lovenox.  transitioned to Eliquis at this time.  Spoke with the patient regarding anticoagulation.  Will likely need 6 weeks of anticoagulation. CTPA negative for PE. ?  ?Asthma-COPD overlap syndrome (Mark Harrell) ?Patient admitted for wheezing and dyspnea at this time though mild  improvement.  Continue with Trelegy, Pulmicort, Singulair, DuoNebs and albuterol as needed.  Did not need systemic steroids. Pt has all inhaler and nebulizer treatments at home. ?  ?Chronic respiratory failure with hypoxia (Mark Harrell) ?On nasal cannula oxygen since January 2023.  Currently using 2 L by nasal cannula ?  ?Hypertension ?Continue bisoprolol-HCTZ from home.   Blood pressure seems to be stable. ?  ?OSA (obstructive sleep apnea) ?Not using CPAP due to reported intolerance. ?  ?Depression with anxiety ?Continue sertraline ?  ?Hyperlipidemia, mixed ?Continue pravastatin. ?

## 2021-09-24 NOTE — Assessment & Plan Note (Signed)
Using 2 L supplemental O2 via [place] since COVID-19 infection in January 2023.  SPO2 stable on home 2 L. ?

## 2021-09-25 ENCOUNTER — Observation Stay (HOSPITAL_COMMUNITY): Payer: BC Managed Care – PPO

## 2021-09-25 ENCOUNTER — Encounter (HOSPITAL_COMMUNITY): Payer: Self-pay | Admitting: Internal Medicine

## 2021-09-25 DIAGNOSIS — I82461 Acute embolism and thrombosis of right calf muscular vein: Secondary | ICD-10-CM

## 2021-09-25 DIAGNOSIS — N401 Enlarged prostate with lower urinary tract symptoms: Secondary | ICD-10-CM | POA: Diagnosis present

## 2021-09-25 DIAGNOSIS — K580 Irritable bowel syndrome with diarrhea: Secondary | ICD-10-CM | POA: Diagnosis present

## 2021-09-25 DIAGNOSIS — F419 Anxiety disorder, unspecified: Secondary | ICD-10-CM | POA: Diagnosis present

## 2021-09-25 DIAGNOSIS — R0602 Shortness of breath: Secondary | ICD-10-CM | POA: Diagnosis present

## 2021-09-25 DIAGNOSIS — Z8701 Personal history of pneumonia (recurrent): Secondary | ICD-10-CM | POA: Diagnosis not present

## 2021-09-25 DIAGNOSIS — R059 Cough, unspecified: Secondary | ICD-10-CM | POA: Diagnosis not present

## 2021-09-25 DIAGNOSIS — Z9981 Dependence on supplemental oxygen: Secondary | ICD-10-CM | POA: Diagnosis not present

## 2021-09-25 DIAGNOSIS — J449 Chronic obstructive pulmonary disease, unspecified: Secondary | ICD-10-CM

## 2021-09-25 DIAGNOSIS — Z20822 Contact with and (suspected) exposure to covid-19: Secondary | ICD-10-CM | POA: Diagnosis present

## 2021-09-25 DIAGNOSIS — I5030 Unspecified diastolic (congestive) heart failure: Secondary | ICD-10-CM

## 2021-09-25 DIAGNOSIS — R0600 Dyspnea, unspecified: Secondary | ICD-10-CM | POA: Diagnosis not present

## 2021-09-25 DIAGNOSIS — E876 Hypokalemia: Secondary | ICD-10-CM | POA: Diagnosis present

## 2021-09-25 DIAGNOSIS — U099 Post covid-19 condition, unspecified: Secondary | ICD-10-CM | POA: Diagnosis present

## 2021-09-25 DIAGNOSIS — F32A Depression, unspecified: Secondary | ICD-10-CM | POA: Diagnosis present

## 2021-09-25 DIAGNOSIS — G4733 Obstructive sleep apnea (adult) (pediatric): Secondary | ICD-10-CM

## 2021-09-25 DIAGNOSIS — Z833 Family history of diabetes mellitus: Secondary | ICD-10-CM | POA: Diagnosis not present

## 2021-09-25 DIAGNOSIS — R918 Other nonspecific abnormal finding of lung field: Secondary | ICD-10-CM | POA: Diagnosis present

## 2021-09-25 DIAGNOSIS — Z87891 Personal history of nicotine dependence: Secondary | ICD-10-CM | POA: Diagnosis not present

## 2021-09-25 DIAGNOSIS — Z8042 Family history of malignant neoplasm of prostate: Secondary | ICD-10-CM | POA: Diagnosis not present

## 2021-09-25 DIAGNOSIS — Z981 Arthrodesis status: Secondary | ICD-10-CM | POA: Diagnosis not present

## 2021-09-25 DIAGNOSIS — E782 Mixed hyperlipidemia: Secondary | ICD-10-CM | POA: Diagnosis present

## 2021-09-25 DIAGNOSIS — Z8249 Family history of ischemic heart disease and other diseases of the circulatory system: Secondary | ICD-10-CM | POA: Diagnosis not present

## 2021-09-25 DIAGNOSIS — I10 Essential (primary) hypertension: Secondary | ICD-10-CM

## 2021-09-25 DIAGNOSIS — J9611 Chronic respiratory failure with hypoxia: Secondary | ICD-10-CM

## 2021-09-25 DIAGNOSIS — Z807 Family history of other malignant neoplasms of lymphoid, hematopoietic and related tissues: Secondary | ICD-10-CM | POA: Diagnosis not present

## 2021-09-25 DIAGNOSIS — F418 Other specified anxiety disorders: Secondary | ICD-10-CM

## 2021-09-25 DIAGNOSIS — I110 Hypertensive heart disease with heart failure: Secondary | ICD-10-CM | POA: Diagnosis present

## 2021-09-25 DIAGNOSIS — I5031 Acute diastolic (congestive) heart failure: Secondary | ICD-10-CM | POA: Diagnosis present

## 2021-09-25 LAB — BASIC METABOLIC PANEL
Anion gap: 10 (ref 5–15)
BUN: 17 mg/dL (ref 6–20)
CO2: 29 mmol/L (ref 22–32)
Calcium: 9.6 mg/dL (ref 8.9–10.3)
Chloride: 101 mmol/L (ref 98–111)
Creatinine, Ser: 0.82 mg/dL (ref 0.61–1.24)
GFR, Estimated: >60 mL/min (ref >60)
Glucose, Bld: 139 mg/dL — ABNORMAL HIGH (ref 70–99)
Potassium: 3.9 mmol/L (ref 3.5–5.1)
Sodium: 140 mmol/L (ref 135–145)

## 2021-09-25 LAB — ECHOCARDIOGRAM COMPLETE
AR max vel: 3.33 cm2
AV Peak grad: 10.5 mmHg
Ao pk vel: 1.62 m/s
Area-P 1/2: 3.79 cm2
Height: 72 in
S' Lateral: 2.9 cm
Weight: 6158.77 oz

## 2021-09-25 LAB — CBC
HCT: 40.6 % (ref 39.0–52.0)
HCT: 41.1 % (ref 39.0–52.0)
Hemoglobin: 12.7 g/dL — ABNORMAL LOW (ref 13.0–17.0)
Hemoglobin: 13 g/dL (ref 13.0–17.0)
MCH: 29.3 pg (ref 26.0–34.0)
MCH: 29.5 pg (ref 26.0–34.0)
MCHC: 31.3 g/dL (ref 30.0–36.0)
MCHC: 31.6 g/dL (ref 30.0–36.0)
MCV: 93.5 fL (ref 80.0–100.0)
MCV: 93.4 fL (ref 80.0–100.0)
Platelets: 257 10*3/uL (ref 150–400)
Platelets: 247 10*3/uL (ref 150–400)
RBC: 4.34 MIL/uL (ref 4.22–5.81)
RBC: 4.4 MIL/uL (ref 4.22–5.81)
RDW: 14.6 % (ref 11.5–15.5)
RDW: 14.7 % (ref 11.5–15.5)
WBC: 7 10*3/uL (ref 4.0–10.5)
WBC: 6.1 10*3/uL (ref 4.0–10.5)
nRBC: 0 % (ref 0.0–0.2)
nRBC: 0 % (ref 0.0–0.2)

## 2021-09-25 LAB — EXPECTORATED SPUTUM ASSESSMENT W GRAM STAIN, RFLX TO RESP C

## 2021-09-25 LAB — MAGNESIUM: Magnesium: 1.7 mg/dL (ref 1.7–2.4)

## 2021-09-25 LAB — STREP PNEUMONIAE URINARY ANTIGEN: Strep Pneumo Urinary Antigen: NEGATIVE

## 2021-09-25 LAB — PROCALCITONIN: Procalcitonin: <0.1 ng/mL

## 2021-09-25 LAB — HIV ANTIBODY (ROUTINE TESTING W REFLEX): HIV Screen 4th Generation wRfx: NONREACTIVE

## 2021-09-25 MED ORDER — SODIUM CHLORIDE (PF) 0.9 % IJ SOLN
INTRAMUSCULAR | Status: AC
  Filled 2021-09-25: qty 50

## 2021-09-25 MED ORDER — AZELASTINE HCL 0.1 % NA SOLN
2.0000 | Freq: Two times a day (BID) | NASAL | Status: DC
  Administered 2021-09-25 – 2021-09-27 (×4): 2 via NASAL
  Filled 2021-09-25: qty 30

## 2021-09-25 MED ORDER — FLUTICASONE PROPIONATE 50 MCG/ACT NA SUSP
2.0000 | Freq: Every day | NASAL | Status: DC
  Administered 2021-09-25 – 2021-09-27 (×3): 2 via NASAL
  Filled 2021-09-25: qty 16

## 2021-09-25 MED ORDER — APIXABAN 5 MG PO TABS
10.0000 mg | ORAL_TABLET | Freq: Two times a day (BID) | ORAL | Status: DC
  Administered 2021-09-25 – 2021-09-27 (×5): 10 mg via ORAL
  Filled 2021-09-25 (×5): qty 2

## 2021-09-25 MED ORDER — ACETAMINOPHEN 500 MG PO TABS
1000.0000 mg | ORAL_TABLET | Freq: Three times a day (TID) | ORAL | Status: DC
  Administered 2021-09-25 – 2021-09-27 (×6): 1000 mg via ORAL
  Filled 2021-09-25 (×6): qty 2

## 2021-09-25 MED ORDER — SALINE SPRAY 0.65 % NA SOLN
1.0000 | Freq: Four times a day (QID) | NASAL | Status: DC | PRN
  Filled 2021-09-25: qty 44

## 2021-09-25 MED ORDER — MAGNESIUM OXIDE -MG SUPPLEMENT 400 (240 MG) MG PO TABS
400.0000 mg | ORAL_TABLET | Freq: Three times a day (TID) | ORAL | Status: DC
  Administered 2021-09-25 – 2021-09-27 (×6): 400 mg via ORAL
  Filled 2021-09-25 (×6): qty 1

## 2021-09-25 MED ORDER — AZELASTINE HCL 137 MCG/SPRAY NA SOLN: 2.0000 | Freq: Two times a day (BID) | NASAL | Status: DC

## 2021-09-25 MED ORDER — MEMANTINE HCL 10 MG PO TABS
10.0000 mg | ORAL_TABLET | Freq: Two times a day (BID) | ORAL | Status: DC
  Administered 2021-09-25 – 2021-09-27 (×5): 10 mg via ORAL
  Filled 2021-09-25 (×5): qty 1

## 2021-09-25 MED ORDER — VITAMIN D3 25 MCG (1000 UNIT) PO TABS
10000.0000 [IU] | ORAL_TABLET | Freq: Two times a day (BID) | ORAL | Status: DC
  Administered 2021-09-25 – 2021-09-27 (×4): 10000 [IU] via ORAL
  Filled 2021-09-25 (×4): qty 10

## 2021-09-25 MED ORDER — ANASTROZOLE 1 MG PO TABS
1.0000 mg | ORAL_TABLET | Freq: Every day | ORAL | Status: DC
  Administered 2021-09-25 – 2021-09-27 (×3): 1 mg via ORAL
  Filled 2021-09-25 (×3): qty 1

## 2021-09-25 MED ORDER — ASCORBIC ACID 500 MG PO TABS
1000.0000 mg | ORAL_TABLET | Freq: Every evening | ORAL | Status: DC
  Administered 2021-09-25 – 2021-09-26 (×2): 1000 mg via ORAL
  Filled 2021-09-25 (×2): qty 2

## 2021-09-25 MED ORDER — LORATADINE 10 MG PO TABS
10.0000 mg | ORAL_TABLET | Freq: Every day | ORAL | Status: DC
Start: 2021-09-25 — End: 2021-09-25

## 2021-09-25 MED ORDER — DICLOFENAC SODIUM 1 % EX GEL
2.0000 g | Freq: Four times a day (QID) | CUTANEOUS | Status: DC | PRN
  Filled 2021-09-25: qty 100

## 2021-09-25 MED ORDER — CETIRIZINE HCL 10 MG PO TABS
10.0000 mg | ORAL_TABLET | Freq: Every day | ORAL | Status: DC
  Administered 2021-09-25 – 2021-09-27 (×3): 10 mg via ORAL
  Filled 2021-09-25 (×3): qty 1

## 2021-09-25 MED ORDER — ELUXADOLINE 100 MG PO TABS: 100.0000 mg | ORAL_TABLET | Freq: Two times a day (BID) | ORAL | Status: DC

## 2021-09-25 MED ORDER — BUDESONIDE 0.5 MG/2ML IN SUSP
0.5000 mg | Freq: Two times a day (BID) | RESPIRATORY_TRACT | Status: DC
  Administered 2021-09-25: 0.5 mg via RESPIRATORY_TRACT
  Filled 2021-09-25: qty 2

## 2021-09-25 MED ORDER — APIXABAN 5 MG PO TABS
5.0000 mg | ORAL_TABLET | Freq: Two times a day (BID) | ORAL | Status: DC
Start: 2021-10-02 — End: 2021-09-27

## 2021-09-25 MED ORDER — OXYMETAZOLINE HCL 0.05 % NA SOLN
1.0000 | Freq: Every day | NASAL | Status: DC
  Administered 2021-09-25 – 2021-09-27 (×2): 1 via NASAL
  Filled 2021-09-25: qty 15

## 2021-09-25 MED ORDER — CELECOXIB 200 MG PO CAPS
200.0000 mg | ORAL_CAPSULE | Freq: Two times a day (BID) | ORAL | Status: DC
  Administered 2021-09-25: 200 mg via ORAL
  Filled 2021-09-25 (×3): qty 1

## 2021-09-25 MED ORDER — IPRATROPIUM-ALBUTEROL 0.5-2.5 (3) MG/3ML IN SOLN
3.0000 mL | Freq: Three times a day (TID) | RESPIRATORY_TRACT | Status: DC
  Administered 2021-09-25 – 2021-09-27 (×7): 3 mL via RESPIRATORY_TRACT
  Filled 2021-09-25 (×7): qty 3

## 2021-09-25 MED ORDER — IOHEXOL 350 MG/ML SOLN
100.0000 mL | Freq: Once | INTRAVENOUS | Status: AC | PRN
  Administered 2021-09-25: 100 mL via INTRAVENOUS

## 2021-09-25 MED ORDER — GUAIFENESIN ER 600 MG PO TB12
1200.0000 mg | ORAL_TABLET | Freq: Three times a day (TID) | ORAL | Status: DC
  Administered 2021-09-25 – 2021-09-27 (×6): 1200 mg via ORAL
  Filled 2021-09-25 (×6): qty 2

## 2021-09-25 MED ORDER — ONDANSETRON 4 MG PO TBDP: 4.0000 mg | ORAL_TABLET | Freq: Four times a day (QID) | ORAL | Status: DC | PRN

## 2021-09-25 MED ORDER — BENZONATATE 100 MG PO CAPS
200.0000 mg | ORAL_CAPSULE | Freq: Three times a day (TID) | ORAL | Status: DC
  Administered 2021-09-25 – 2021-09-27 (×6): 200 mg via ORAL
  Filled 2021-09-25 (×6): qty 2

## 2021-09-25 MED ORDER — DONEPEZIL HCL 23 MG PO TABS
23.0000 mg | ORAL_TABLET | Freq: Every day | ORAL | Status: DC
  Administered 2021-09-25 – 2021-09-27 (×3): 23 mg via ORAL
  Filled 2021-09-25 (×3): qty 1

## 2021-09-25 NOTE — Progress Notes (Signed)
?PROGRESS NOTE ? ? ? ?[name]  ZCH:[id number] DOB: [birth year] DOA: 09/24/2021 ?PCP: [name], MD  ? ? ?Brief Narrative:  ?Mark Harrell. is a 61 y.o. male with past medical history significant for COPD/asthma overlap syndrome, long COVID (05/2021) with chronic hypoxic respiratory failure on 2 L O2 via [place], HTN, HLD, depression/anxiety, memory issues, migraines, morbid obesity, OSA not using CPAP presented to hospital with progressive shortness of breath and dyspnea with increasing leg swelling.  Of note patient was recently seen in the ED on 09/03/2021 and CT of the chest showed airspace opacities and was given Levaquin for 5 days.  Symptoms did not improve so he was restarted on Ceftin on 4/19.  Despite this he continues to have persistent cough with productive sputum and dyspnea worse on exertion with progressive lower extremity edema.  Was on Lasix but had stopped it due to dyspnea from back-and-forth to the bathroom.  In the ED patient was noted to have mild tachypnea with pulse ox of 93% on 3 L of oxygen by nasal cannula.  Creatinine was 0.8.  Hemoglobin at 12.3.  Troponin negative.  BNP 86.  COVID and influenza was negative.  Chest x-ray showed retrocardiac infiltrate.  Ultrasound of the lower extremity showed nonocclusive thrombus in the right gastrocnemius vein.  Patient was given Lasix and was admitted hospital for further evaluation and treatment. ? ?Assessment and Plan: ?Principal Problem: ?  Shortness of breath ?Active Problems: ?  Acute deep vein thrombosis (DVT) of right lower extremity ([place]) ?  Asthma-COPD overlap syndrome ([place]) ?  Chronic respiratory failure with hypoxia (HCC) ?  Hypertension ?  Hyperlipidemia, mixed ?  Depression with anxiety ?  OSA (obstructive sleep apnea) ?  ?Shortness of breath ?Progressively worsening dyspnea likely multifactorial related to long COVID, retrocardiac opacity on CXR concerning for pneumonia, and anasarca suspect due to underlying CHF.  Continue IV  Rocephin and Zithromax.  Continue Lasix 60 mg twice daily.  CT angiogram of the chest showed moderate severity bilateral lower lobe airspace opacity.  2D echocardiogram from 09/25/2021 showed elevation fraction of 70 to 75% with LVH, no regional wall motion abnormality, grade 1 diastolic dysfunction.negative strep pneumonia antigen but  Legionella urinary antigen pending.  Continue strict intake and output charting Daily weights.  Fluid restriction.  We will add procalcitonin. ?  ?Acute deep vein thrombosis (DVT) of right lower extremity ([place]) ?Small nonocclusive thrombus seen within the right gastrocnemius vein on venous ultrasound.  On Lovenox.  We will transition to Eliquis at this time.  Spoke with the patient regarding anticoagulation. ?  ?Asthma-COPD overlap syndrome ([place]) ?Still has some mild wheezing and dyspnea.  Continue  Trelegy, Pulmicort, Singulair, DuoNebs and albuterol as needed ?  ?Chronic respiratory failure with hypoxia (HCC) ?On nasal cannula oxygen since January 2023.  Currently using 2 L by nasal cannula ?  ?Hypertension ?Continue bisoprolol-HCTZ. ?  ?OSA (obstructive sleep apnea) ?Not using CPAP due to reported intolerance. ?  ?Depression with anxiety ?Continue sertraline ?  ?Hyperlipidemia, mixed ?Continue pravastatin.  ? ? DVT prophylaxis:  ?  Lovenox subcu therapeutic dose, will transition to Eliquis. ? ?Code Status:   ?  Code Status: Full Code ? ?Disposition: Home in 1 to 2 days ? ?Status is: Observation ? ?The patient will require care spanning > 2 midnights and should be moved to inpatient because: IV diuretics, IV antibiotic, clinical improvement ? ? Family Communication:  ?Spoke with the patient at bedside ? ?Consultants:  ?None ? ?  Procedures:  ?None ? ?Antimicrobials:  ?Rocephin and Zithromax ? ?Anti-infectives (From admission, onward)  ? ? Start     Dose/Rate Route Frequency Ordered Stop  ? 09/25/21 0030  cefTRIAXone (ROCEPHIN) 2 g in sodium chloride 0.9 % 100 mL IVPB       ? 2 g ?200  mL/hr over 30 Minutes Intravenous Every 24 hours 09/24/21 2337 09/30/21 0029  ? 09/25/21 0030  azithromycin (ZITHROMAX) 500 mg in sodium chloride 0.9 % 250 mL IVPB       ? 500 mg ?250 mL/hr over 60 Minutes Intravenous Every 24 hours 09/24/21 2337 09/30/21 0029  ? ?  ? ? ?Subjective: ?Today, patient was seen and examined at bedside.  Complains of loose stools and frequent urination.  Swelling has improved with diuretics.  Still has some cough and dyspnea on exertion.  Denies fever or chills. ? ?Objective: ?Vitals:  ? 09/24/21 1900 09/24/21 2215 09/25/21 0507 09/25/21 2841  ?BP: 119/71 (!) 156/88 122/76   ?Pulse: 89 (!) 102 (!) 102   ?Resp: (!) '24 20 20   '$ ?Temp:  99.7 ?F (37.6 ?C) 99.8 ?F (37.7 ?C)   ?TempSrc:  Oral Oral   ?SpO2: 96% 94% 92% 92%  ?Weight:      ?Height:      ? ? ?Intake/Output Summary (Last 24 hours) at 09/25/2021 1128 ?Last data filed at 09/25/2021 0900 ?Gross per 24 hour  ?Intake 1670 ml  ?Output --  ?Net 1670 ml  ? ?Filed Weights  ? 09/24/21 1608  ?Weight: (!) 174.6 kg  ? ? ?Physical Examination: ? ?General: Morbidly obese built, not in obvious distress on nasal cannula oxygen ?HENT:   No scleral pallor or icterus noted. Oral mucosa is moist.  ?Chest:    Diminished breath sounds bilaterally.  Coarse breath sounds noted ?CVS: S1 &S2 heard. No murmur.  Regular rate and rhythm. ?Abdomen: Soft, nontender, nondistended.  Obese abdomen bowel sounds are heard.   ?Extremities: No cyanosis, clubbing with bilateral lower extremity edema peripheral pulses are palpable. ?Psych: Alert, awake and oriented, normal mood ?CNS:  No cranial nerve deficits.  Power equal in all extremities.   ?Skin: Warm and dry.  No rashes noted. ? ?Data Reviewed:  ? ?CBC: ?Recent Labs  ?Lab 09/24/21 ?1644 09/25/21 ?0420  ?WBC 6.2 7.0  ?NEUTROABS 2.9  --   ?HGB 12.3* 12.7*  ?HCT 39.4 40.6  ?MCV 92.3 93.5  ?PLT 240 257  ? ? ?Basic Metabolic Panel: ?Recent Labs  ?Lab 09/24/21 ?1644 09/25/21 ?0420  ?NA 141 140  ?K 4.2 3.9  ?CL 101 101  ?CO2  30 29  ?GLUCOSE 94 139*  ?BUN 20 17  ?CREATININE 0.81 0.82  ?CALCIUM 10.2 9.6  ?MG 1.8 1.7  ? ? ?Liver Function Tests: ?Recent Labs  ?Lab 09/24/21 ?1644  ?AST 17  ?ALT 17  ?ALKPHOS 55  ?BILITOT 1.1  ?PROT 6.1*  ?ALBUMIN 3.6  ? ? ? ?Radiology Studies: ?DG Chest 2 View ? ?Result Date: 09/24/2021 ?CLINICAL DATA:  Worsening shortness of breath and shortness of breath with exertion, subjective fever at home, productive cough, history COPD, asthma EXAM: CHEST - 2 VIEW COMPARISON:  09/03/2021 FINDINGS: Enlargement of cardiac silhouette with pulmonary vascular congestion. Minimal atelectasis at RIGHT base. Retrocardiac infiltrate consistent with pneumonia. No pleural effusion or pneumothorax. Prior cervical spine fusion. IMPRESSION: Enlargement of cardiac silhouette with pulmonary vascular congestion. Retrocardiac consolidation consistent with LEFT lower lobe pneumonia. Electronically Signed   By: Lavonia Dana M.D.   On: 09/24/2021 16:34  ? ?  CT Angio Chest Pulmonary Embolism (PE) W or WO Contrast ? ?Result Date: 09/25/2021 ?CLINICAL DATA:  Persistent cough and dyspnea. EXAM: CT ANGIOGRAPHY CHEST WITH CONTRAST TECHNIQUE: Multidetector CT imaging of the chest was performed using the standard protocol during bolus administration of intravenous contrast. Multiplanar CT image reconstructions and MIPs were obtained to evaluate the vascular anatomy. RADIATION DOSE REDUCTION: This exam was performed according to the departmental dose-optimization program which includes automated exposure control, adjustment of the mA and/or kV according to patient size and/or use of iterative reconstruction technique. CONTRAST:  186m OMNIPAQUE IOHEXOL 350 MG/ML SOLN COMPARISON:  September 03, 2021 FINDINGS: Cardiovascular: The pulmonary arteries are limited in evaluation secondary to suboptimal opacification with intravenous contrast. The heart size is along the upper limits of normal. No pericardial effusion. Mediastinum/Nodes: There is mild pretracheal,  AP window and right hilar lymphadenopathy. Thyroid gland, trachea, and esophagus demonstrate no significant findings. Lungs/Pleura: Patchy, moderate severity areas of airspace disease are seen within the

## 2021-09-25 NOTE — Progress Notes (Signed)
ANTICOAGULATION CONSULT NOTE  ? ?Pharmacy Consult for apixaban ?Indication: VTE treatment ? ?Allergies  ?Allergen Reactions  ? Bee Venom Swelling  ? Duloxetine Shortness Of Breath  ?  Brought on asthma  ? Ppd [Tuberculin Purified Protein Derivative] Other (See Comments)  ?  +ppd NEG Quantferron Gold 3/13 (shows false positive)   ? Fenofibrate Other (See Comments)  ?  Back pain  ? Verapamil Other (See Comments)  ?  Back pain  ? Claritin [Loratadine] Other (See Comments)  ?  UNKNOWN   ? Levofloxacin Diarrhea  ? Other Diarrhea  ?  "Some antibiotics" cause diarrhea  ? ? ?Patient Measurements: ?Height: 6' (182.9 cm) ?Weight: (!) 174.6 kg (384 lb 14.8 oz) ?IBW/kg (Calculated) : 77.6 ?Heparin Dosing Weight:  ? ?Vital Signs: ?Temp: 98 ?F (36.7 ?C) (05/03 1259) ?Temp Source: Oral (05/03 1259) ?BP: 118/78 (05/03 1259) ?Pulse Rate: 92 (05/03 1259) ? ?Labs: ?Recent Labs  ?  09/24/21 ?1644 09/24/21 ?1844 09/25/21 ?0420 09/25/21 ?1150  ?HGB 12.3*  --  12.7* 13.0  ?HCT 39.4  --  40.6 41.1  ?PLT 240  --  257 247  ?CREATININE 0.81  --  0.82  --   ?TROPONINIHS 10 10  --   --   ? ? ? ?Estimated Creatinine Clearance: 157.7 mL/min (by C-G formula based on SCr of 0.82 mg/dL). ? ? ?Medical History: ?Past Medical History:  ?Diagnosis Date  ? Anesthesia complication requiring reversal agent administration   ? ? from central apnea, very difficult to get off vent  ? Anxiety   ? Arthritis   ? osteo  ? Asthma   ? BPH (benign prostatic hyperplasia)   ? Complication of anesthesia   ? difficulty waking , they twlight me because of my respiratory problems "  ? Depression   ? Dyspnea   ? on exertion  ? Enlarged heart   ? Family history of adverse reaction to anesthesia   ? mother trouble waking up, and heart stopped  ? GERD (gastroesophageal reflux disease)   ? Headache   ? botox injections for headaches  ? Hyperlipidemia   ? Hypertension   ? Hypogonadism male   ? IBS (irritable bowel syndrome)   ? Memory difficulties   ? short term memories  ?  Neuropathy   ? Obesity   ? On home oxygen therapy   ? on 2 liter  ? OSA (obstructive sleep apnea)   ? not using cpap  ? Paralysis ([place])   ? left hand - small  ? Pneumonia   ? Pre-diabetes   ? Prostatitis   ? ? ? ? ?Assessment: ? 61 y.o. male with medical history significant for COPD/asthma overlap syndrome, long COVID (05/2021) with chronic hypoxic respiratory failure on 2 L O2 via [place], HTN, HLD, depression/anxiety, memory issues, migraines, morbid obesity, OSA not using CPAP who presented to the ED for evaluation of shortness of breath. Found Right lower extremity venous ultrasound showed a small amount of nonocclusive thrombus within the right gastrocnemius vein. Pharmacy consulted to dose enoxaparin.  No prior AC noted ? ? ?Today,09/25/21 ?Transition from enoxaparin to apixaban today  ?Hgb 13.0, plt 247  ?SCr 0.82 mg/dl  ? ?Goal of Therapy:  ?Anti-Xa level 0.6-1 units/ml 4hrs after LMWH dose given ?Monitor platelets by anticoagulation protocol: Yes ?  ?Plan:  ?Apixaban 10 mg PO BID x 7 days then 5 mg PO BID  ?Monitor SCr, CBC  ?Monitor for signs and symptoms of bleeding ? ? ? ?Royetta Asal, PharmD,  BCPS ?09/25/2021 2:30 PM ? ? ? ?

## 2021-09-25 NOTE — Plan of Care (Signed)
  Problem: Education: Goal: Ability to demonstrate management of disease process will improve Outcome: Progressing Goal: Ability to verbalize understanding of medication therapies will improve Outcome: Progressing   
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
## 2021-09-25 NOTE — TOC Initial Note (Addendum)
Transition of Care (TOC) - Initial/Assessment Note  ? ? ?Patient Details  ?Name: Mark Harrell. ?MRN: [id number] ?Date of Birth: 04-30-1961 ? ?Transition of Care [facility]) CM/SW Contact:    ?Leeroy Cha, RN ?Phone Number: ?09/25/2021, 10:44 AM ? ?Clinical Narrative:                 ?Request for hhf program sent to amy hyatt with [facility]. ?[facility]-amy hyatt unable to take.  Request sent to [facility]. ?Expected Discharge Plan: [place] ?Barriers to Discharge: No Barriers Identified ? ? ?Patient Goals and CMS Choice ?Patient states their goals for this hospitalization and ongoing recovery are:: to go home ?CMS Medicare.gov Compare Post Acute Care list provided to:: Patient ?Choice offered to / list presented to : Patient ? ?Expected Discharge Plan and Services ?Expected Discharge Plan: [place] ?  ?Discharge Planning Services: CM Consult, Other - See comment (home hf program) ?Post Acute Care Choice: Home Health ?Living arrangements for the past 2 months: [place] ?                ?  ?  ?  ?  ?  ?HH Arranged: Disease Management ?[place] Agency: [place] ?Date HH Agency Contacted: 09/25/21 ?Time [place]: 2542 ?Representative spoke with at [place]: amy hyatt ? ?Prior Living Arrangements/Services ?Living arrangements for the past 2 months: [place] ?Lives with:: Self ?  ?       ?  ?  ?  ?  ? ?Activities of Daily Living ?Home Assistive Devices/Equipment: Cane (specify quad or straight), Other (Comment) (rolator) ?ADL Screening (condition at time of admission) ?Patient's cognitive ability adequate to safely complete daily activities?: Yes ?Is the patient deaf or have difficulty hearing?: Yes ?Does the patient have difficulty seeing, even when wearing glasses/contacts?: No ?Does the patient have difficulty concentrating, remembering, or making decisions?: No ?Patient able to express need for assistance with ADLs?: Yes ?Does the patient have  difficulty dressing or bathing?: Yes ?Independently performs ADLs?: No ?Communication: Independent ?Dressing (OT): Needs assistance ?Is this a change from baseline?: Change from baseline, expected to last <3days ?Grooming: Needs assistance ?Is this a change from baseline?: Change from baseline, expected to last <3 days ?Feeding: Independent ?Bathing: Independent ?Toileting: Independent ?In/Out Bed: Independent ?Walks in Home: Independent with device (comment) (cane) ?Does the patient have difficulty walking or climbing stairs?: Yes ?Weakness of Legs: Both ?Weakness of Arms/Hands: Left ? ?Permission Sought/Granted ?  ?  ?   ?   ?   ?   ? ?Emotional Assessment ?Appearance:: Appears stated age ?  ?  ?Orientation: : Oriented to Self, Oriented to Place, Oriented to  Time, Oriented to Situation ?Alcohol / Substance Use: Not Applicable ?Psych Involvement: No (comment) ? ?Admission diagnosis:  Shortness of breath [R06.02] ?Anasarca [R60.1] ?Peripheral edema [R60.9] ?Patient Active Problem List  ? Diagnosis Date Noted  ? Shortness of breath 09/24/2021  ? Acute deep vein thrombosis (DVT) of right lower extremity ([place]) 09/24/2021  ? OSA (obstructive sleep apnea) 09/24/2021  ? Herniation of cervical intervertebral disc with radiculopathy   ? Status post cervical discectomy 08/20/2020  ? Tracheomalacia 05/26/2019  ? Acquired tracheomalacia 05/26/2019  ? HNP (herniated nucleus pulposus) with myelopathy, cervical 05/23/2019  ?  Class: Chronic  ? Spinal stenosis of cervical region 05/23/2019  ?  Class: Chronic  ? Status post cervical spinal fusion 05/23/2019  ? Chronic respiratory failure with hypoxia ([place]) 04/12/2018  ?  Bilateral lower extremity edema 12/29/2017  ? Hymenoptera allergy 11/10/2017  ? Family history of colonic polyps 08/07/2017  ? Myofascial pain 08/06/2017  ? Seasonal and perennial allergic rhinitis 04/23/2017  ? 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/2017  ? Cervicalgia 04/01/2017  ? Other spondylosis with radiculopathy,  cervical region 02/09/2017  ? Depression with anxiety 01/04/2017  ? Memory difficulty 12/05/2016  ? Morbid obesity ([place]) 12/05/2016  ? Migraine without aura and without status migrainosus, not intractable 10/30/2016  ? Lumbar radiculopathy 05/15/2016  ? Osteoarthritis of spine with radiculopathy, lumbar region 05/15/2016  ? Risk for falls 05/15/2016  ? Recurrent infections 03/29/2016  ? Chronic nonseasonal allergic rhinitis due to pollen 03/29/2016  ? Polypharmacy 01/16/2016  ? Morbid obesity with BMI of 45.0-49.9, adult ([place]) 03/06/2015  ? SDAT 02/05/2015  ? OSA and COPD overlap syndrome ([place]) 02/05/2015  ? Medication management 08/02/2014  ? GERD (gastroesophageal reflux disease) 05/09/2014  ? Vitamin D deficiency 08/01/2013  ? Prediabetes 08/01/2013  ? Positive TB test 07/29/2011  ? Diverticula of colon 05/07/2011  ? Hypertension 01/31/2011  ? Hyperlipidemia, mixed 01/31/2011  ? BPH (benign prostatic hyperplasia) 01/31/2011  ? Testosterone Deficiency 01/31/2011  ? IBS (irritable bowel syndrome) 01/31/2011  ? Partial complex seizure disorder with intractable epilepsy ([place]) 01/31/2011  ? Depression, major, recurrent, in partial remission ([place]) 01/31/2011  ? Asthma-COPD overlap syndrome ([place]) 01/31/2011  ? ?PCP:  [name], MD ?Pharmacy:   ?[id number] [place], [place] - [address] AT [id number]?Phone: [phone]: [phone]? ?[facility] (Passenger transport manager [place]?[address] ?[id number]?Phone: [phone]: [phone]? ? ? ? ?Social Determinants of Health (SDOH) Interventions ?  ? ?Readmission Risk Interventions ?   ? View : No data to display.  ?  ?  ?  ? ? ? ?
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
## 2021-09-26 ENCOUNTER — Encounter (HOSPITAL_COMMUNITY): Payer: Self-pay | Admitting: Internal Medicine

## 2021-09-26 DIAGNOSIS — J9611 Chronic respiratory failure with hypoxia: Secondary | ICD-10-CM | POA: Diagnosis not present

## 2021-09-26 DIAGNOSIS — I82461 Acute embolism and thrombosis of right calf muscular vein: Secondary | ICD-10-CM | POA: Diagnosis not present

## 2021-09-26 DIAGNOSIS — J449 Chronic obstructive pulmonary disease, unspecified: Secondary | ICD-10-CM | POA: Diagnosis not present

## 2021-09-26 DIAGNOSIS — R0602 Shortness of breath: Secondary | ICD-10-CM | POA: Diagnosis not present

## 2021-09-26 DIAGNOSIS — E876 Hypokalemia: Secondary | ICD-10-CM

## 2021-09-26 LAB — CBC
HCT: 42.4 % (ref 39.0–52.0)
Hemoglobin: 13.6 g/dL (ref 13.0–17.0)
MCH: 29.8 pg (ref 26.0–34.0)
MCHC: 32.1 g/dL (ref 30.0–36.0)
MCV: 93 fL (ref 80.0–100.0)
Platelets: 287 10*3/uL (ref 150–400)
RBC: 4.56 MIL/uL (ref 4.22–5.81)
RDW: 14.6 % (ref 11.5–15.5)
WBC: 9.4 10*3/uL (ref 4.0–10.5)
nRBC: 0 % (ref 0.0–0.2)

## 2021-09-26 LAB — BASIC METABOLIC PANEL
Anion gap: 13 (ref 5–15)
BUN: 18 mg/dL (ref 6–20)
CO2: 32 mmol/L (ref 22–32)
Calcium: 9.3 mg/dL (ref 8.9–10.3)
Chloride: 98 mmol/L (ref 98–111)
Creatinine, Ser: 0.86 mg/dL (ref 0.61–1.24)
GFR, Estimated: >60 mL/min (ref >60)
Glucose, Bld: 105 mg/dL — ABNORMAL HIGH (ref 70–99)
Potassium: 3.4 mmol/L — ABNORMAL LOW (ref 3.5–5.1)
Sodium: 143 mmol/L (ref 135–145)

## 2021-09-26 LAB — LEGIONELLA PNEUMOPHILA SEROGP 1 UR AG: L. pneumophila Serogp 1 Ur Ag: NEGATIVE

## 2021-09-26 LAB — MAGNESIUM: Magnesium: 2 mg/dL (ref 1.7–2.4)

## 2021-09-26 LAB — PROCALCITONIN: Procalcitonin: <0.1 ng/mL

## 2021-09-26 MED ORDER — POTASSIUM CHLORIDE CRYS ER 20 MEQ PO TBCR
40.0000 meq | EXTENDED_RELEASE_TABLET | Freq: Every day | ORAL | Status: DC
  Administered 2021-09-26 – 2021-09-27 (×2): 40 meq via ORAL
  Filled 2021-09-26 (×2): qty 2

## 2021-09-26 MED ORDER — MENTHOL 3 MG MT LOZG: 1.0000 | LOZENGE | OROMUCOSAL | Status: DC | PRN

## 2021-09-26 NOTE — Discharge Instructions (Addendum)
Information on my medicine - ELIQUIS? (apixaban) ? ?This medication education was reviewed with me or my healthcare representative as part of my discharge preparation.  The pharmacist that spoke with me during my hospital stay was:  ?[place], [place] ? ?Why was Eliquis? prescribed for you? ?Eliquis? was prescribed to treat blood clots that may have been found in the veins of your legs (deep vein thrombosis) or in your lungs (pulmonary embolism) and to reduce the risk of them occurring again. ? ?What do You need to know about Eliquis? ? ?The starting dose is 10 mg (two 5 mg tablets) taken TWICE daily for the FIRST SEVEN (7) DAYS, then on (enter date)  10/02/21  the dose is reduced to ONE 5 mg tablet taken TWICE daily.  Eliquis? may be taken with or without food.  ? ?Try to take the dose about the same time in the morning and in the evening. If you have difficulty swallowing the tablet whole please discuss with your pharmacist how to take the medication safely. ? ?Take Eliquis? exactly as prescribed and DO NOT stop taking Eliquis? without talking to the doctor who prescribed the medication.  Stopping may increase your risk of developing a new blood clot.  Refill your prescription before you run out. ? ?After discharge, you should have regular check-up appointments with your healthcare provider that is prescribing your Eliquis?. ?   ?What do you do if you miss a dose? ?If a dose of ELIQUIS? is not taken at the scheduled time, take it as soon as possible on the same day and twice-daily administration should be resumed. The dose should not be doubled to make up for a missed dose. ? ?Important Safety Information ?A possible side effect of Eliquis? is bleeding. You should call your healthcare provider right away if you experience any of the following: ?Bleeding from an injury or your nose that does not stop. ?Unusual colored urine (red or dark brown) or unusual colored stools (red or black). ?Unusual bruising for  unknown reasons. ?A serious fall or if you hit your head (even if there is no bleeding). ? ?Some medicines may interact with Eliquis? and might increase your risk of bleeding or clotting while on Eliquis?[name] To help avoid this, consult your healthcare provider or pharmacist prior to using any new prescription or non-prescription medications, including herbals, vitamins, non-steroidal anti-inflammatory drugs (NSAIDs) and supplements. ? ?This website has more information on Eliquis? (apixaban): http://www.eliquis.com/eliquis/home  ?

## 2021-09-26 NOTE — Progress Notes (Addendum)
?PROGRESS NOTE ? ? ? ?Mark Harrell.  WGN:[id number] DOB: Nov 21, 1960 DOA: 09/24/2021 ?PCP: [name], MD  ? ? ?Brief Narrative:  ?Mark Harrell. is a 61 y.o. male with past medical history significant for COPD/asthma overlap syndrome, long COVID (05/2021) with chronic hypoxic respiratory failure on 2 L O2 via [place], hypertension, hyperlipidemia depression/anxiety, memory issues, migraines, morbid obesity, OSA not using CPAP presented to hospital with progressive shortness of breath and dyspnea with increasing leg swelling.  Of note, patient was recently seen in the ED on 09/03/2021 and CT of the chest showed airspace opacities and was given Levaquin for 5 days.  Symptoms did not improve so he was restarted on Ceftin on 09/11/21.  Despite this, he continues to have persistent cough with productive sputum and dyspnea worse on exertion with progressive lower extremity edema.  Patient was on Lasix but had stopped it due to dyspnea from back-and-forth to the bathroom.  In the ED, patient was noted to have mild tachypnea with pulse ox of 93% on 3 L of oxygen by nasal cannula.  Creatinine was 0.8.  Hemoglobin at 12.3.  Troponin was negative.  BNP 86.  COVID and influenza was negative.  Chest x-ray showed retrocardiac infiltrate.  Ultrasound of the lower extremity showed nonocclusive thrombus in the right gastrocnemius vein.  Patient was given Lasix and was admitted to the hospital for further evaluation and treatment. ? ?Assessment and Plan: ?Principal Problem: ?  Shortness of breath ?Active Problems: ?  Acute deep vein thrombosis (DVT) of right lower extremity ([place]) ?  Asthma-COPD overlap syndrome ([place]) ?  Chronic respiratory failure with hypoxia (HCC) ?  Hypertension ?  Hyperlipidemia, mixed ?  Depression with anxiety ?  OSA (obstructive sleep apnea) ?  Dyspnea ?  Hypokalemia ?  ?Shortness of breath/dyspnea ?Progressively worsening dyspnea likely multifactorial related to long COVID, morbid obesity, history of sleep  apnea, retrocardiac  pneumonia, and anasarca suspect due to underlying acute diastolic CHF.  Continue IV Rocephin and Zithromax.  Currently on Lasix 60 mg twice daily with good diuresis.  Patient states that he is breathing slightly better today but he still has dyspnea on exertion.  CT angiogram of the chest showed moderate severity bilateral lower lobe airspace opacity but no mention of pulmonary embolism..  2D echocardiogram from 09/25/2021 showed elevation fraction of 70 to 75% with LVH, no regional wall motion abnormality, grade 1 diastolic dysfunction. Negative strep pneumonia antigen but  Legionella urinary antigen pending.  Continue strict intake and output charting, Daily weights.  Fluid restriction.  Procalcitonin was less than 0.10.  Continues incentive spirometry, flutter valve. ? ?Mild hypokalemia.  Will replace orally.  On diuretics.  Will closely monitor. ?  ?Acute deep vein thrombosis (DVT) of right lower extremity ([place]) ?Small nonocclusive thrombus seen within the right gastrocnemius vein on venous ultrasound.  On Lovenox.  We will transition to Eliquis at this time.  Spoke with the patient regarding anticoagulation. ?  ?Asthma-COPD overlap syndrome ([name]) ?Patient continues to have wheezing and dyspnea at this time though mild improvement.  Might need to continue with the current level of care for continue  Trelegy, Pulmicort, Singulair, DuoNebs and albuterol as needed ?  ?Chronic respiratory failure with hypoxia (HCC) ?On nasal cannula oxygen since January 2023.  Currently using 2 L by nasal cannula ?  ?Hypertension ?Continue bisoprolol-HCTZ from home.  Blood pressure seems to be stable. ?  ?OSA (obstructive sleep apnea) ?Not using CPAP due to reported intolerance. ?  ?  Depression with anxiety ?Continue sertraline ?  ?Hyperlipidemia, mixed ?Continue pravastatin.  ? ? DVT prophylaxis:  ?apixaban (ELIQUIS) tablet 10 mg  ?apixaban (ELIQUIS) tablet 5 mg   ? ?Code Status:   ?  Code Status: Full  Code ? ?Disposition: Home in 1 to 2 days ? ?Status is: Inpatient ? ?The patient is  inpatient because: IV diuretics, IV antibiotic, pending clinical improvement ? ? Family Communication:  ?Spoke with the patient at bedside ? ?Consultants:  ?None ? ?Procedures:  ?None ? ?Antimicrobials:  ?Rocephin and Zithromax IV 5/3> ? ?Anti-infectives (From admission, onward)  ? ? Start     Dose/Rate Route Frequency Ordered Stop  ? 09/25/21 0030  cefTRIAXone (ROCEPHIN) 2 g in sodium chloride 0.9 % 100 mL IVPB       ? 2 g ?200 mL/hr over 30 Minutes Intravenous Every 24 hours 09/24/21 2337 09/29/21 2159  ? 09/25/21 0030  azithromycin (ZITHROMAX) 500 mg in sodium chloride 0.9 % 250 mL IVPB       ? 500 mg ?250 mL/hr over 60 Minutes Intravenous Every 24 hours 09/24/21 2337 09/29/21 2159  ? ?  ? ? ?Subjective: ?Today, patient was seen and examined at bedside.  Feels little better with breathing but still has dyspnea wheezing cough.  He has been diuresing well and the swelling has slightly decreased.  Has chronic baseline diarrhea at home.  Complains of a sore throat. ? ?Objective: ?Vitals:  ? 09/25/21 1416 09/25/21 2037 09/26/21 0426 09/26/21 0942  ?BP:  124/79 137/88   ?Pulse:  84 78   ?Resp:  20 20   ?Temp:  98.4 ?F (36.9 ?C) 97.8 ?F (36.6 ?C)   ?TempSrc:  Oral Oral   ?SpO2: 97% 99% 97% 96%  ?Weight:      ?Height:      ? ? ?Intake/Output Summary (Last 24 hours) at 09/26/2021 1238 ?Last data filed at 09/26/2021 0155 ?Gross per 24 hour  ?Intake 1300 ml  ?Output 350 ml  ?Net 950 ml  ? ?Filed Weights  ? 09/24/21 1608  ?Weight: (!) 174.6 kg  ? ? ?Physical Examination: ?Body mass index is 52.2 kg/m?.  ? ?General: Morbidly obese built, not in obvious distress, on nasal cannula oxygen ?HENT:   No scleral pallor or icterus noted. Oral mucosa is moist.  Pharyngeal congestion noted. ?Chest:    Diminished breath sounds bilaterally.  Mild rhonchi noted, coarse breath sounds ?CVS: S1 &S2 heard. No murmur.  Regular rate and rhythm. ?Abdomen: Soft,  nontender,  Obese abdomen.  Bowel sounds are heard.   ?Extremities: No cyanosis, clubbing bilateral lower extremity pitting edema noted.  Peripheral pulses are palpable. ?Psych: Alert, awake and oriented, normal mood ?CNS:  No cranial nerve deficits.  Power equal in all extremities.   ?Skin: Warm and dry.  No rashes noted. ? ?Data Reviewed:  ? ?CBC: ?Recent Labs  ?Lab 09/24/21 ?1644 09/25/21 ?0420 09/25/21 ?1150 09/26/21 ?1610  ?WBC 6.2 7.0 6.1 9.4  ?NEUTROABS 2.9  --   --   --   ?HGB 12.3* 12.7* 13.0 13.6  ?HCT 39.4 40.6 41.1 42.4  ?MCV 92.3 93.5 93.4 93.0  ?PLT 240 257 247 287  ? ? ?Basic Metabolic Panel: ?Recent Labs  ?Lab 09/24/21 ?1644 09/25/21 ?0420 09/26/21 ?9604  ?NA 141 140 143  ?K 4.2 3.9 3.4*  ?CL 101 101 98  ?CO2 30 29 32  ?GLUCOSE 94 139* 105*  ?BUN '20 17 18  '$ ?CREATININE 0.81 0.82 0.86  ?CALCIUM 10.2 9.6 9.3  ?MG  1.8 1.7 2.0  ? ? ?Liver Function Tests: ?Recent Labs  ?Lab 09/24/21 ?1644  ?AST 17  ?ALT 17  ?ALKPHOS 55  ?BILITOT 1.1  ?PROT 6.1*  ?ALBUMIN 3.6  ? ? ? ?Radiology Studies: ?DG Chest 2 View ? ?Result Date: 09/24/2021 ?CLINICAL DATA:  Worsening shortness of breath and shortness of breath with exertion, subjective fever at home, productive cough, history COPD, asthma EXAM: CHEST - 2 VIEW COMPARISON:  09/03/2021 FINDINGS: Enlargement of cardiac silhouette with pulmonary vascular congestion. Minimal atelectasis at RIGHT base. Retrocardiac infiltrate consistent with pneumonia. No pleural effusion or pneumothorax. Prior cervical spine fusion. IMPRESSION: Enlargement of cardiac silhouette with pulmonary vascular congestion. Retrocardiac consolidation consistent with LEFT lower lobe pneumonia. Electronically Signed   By: Lavonia Dana M.D.   On: 09/24/2021 16:34  ? ?CT Angio Chest Pulmonary Embolism (PE) W or WO Contrast ? ?Result Date: 09/25/2021 ?CLINICAL DATA:  Persistent cough and dyspnea. EXAM: CT ANGIOGRAPHY CHEST WITH CONTRAST TECHNIQUE: Multidetector CT imaging of the chest was performed using the  standard protocol during bolus administration of intravenous contrast. Multiplanar CT image reconstructions and MIPs were obtained to evaluate the vascular anatomy. RADIATION DOSE REDUCTION: This exam was performed accord

## 2021-09-26 NOTE — Plan of Care (Signed)
?  Problem: Clinical Measurements: ?Goal: Ability to maintain clinical measurements within normal limits will improve ?Outcome: Progressing ?Goal: Will remain free from infection ?Outcome: Progressing ?Goal: Diagnostic test results will improve ?Outcome: Progressing ?  ?

## 2021-09-27 DIAGNOSIS — E876 Hypokalemia: Secondary | ICD-10-CM

## 2021-09-27 LAB — BASIC METABOLIC PANEL
Anion gap: 11 (ref 5–15)
BUN: 20 mg/dL (ref 6–20)
CO2: 32 mmol/L (ref 22–32)
Calcium: 8.6 mg/dL — ABNORMAL LOW (ref 8.9–10.3)
Chloride: 98 mmol/L (ref 98–111)
Creatinine, Ser: 0.91 mg/dL (ref 0.61–1.24)
GFR, Estimated: >60 mL/min (ref >60)
Glucose, Bld: 133 mg/dL — ABNORMAL HIGH (ref 70–99)
Potassium: 3.1 mmol/L — ABNORMAL LOW (ref 3.5–5.1)
Sodium: 141 mmol/L (ref 135–145)

## 2021-09-27 LAB — CBC
HCT: 41.7 % (ref 39.0–52.0)
Hemoglobin: 13.2 g/dL (ref 13.0–17.0)
MCH: 29.5 pg (ref 26.0–34.0)
MCHC: 31.7 g/dL (ref 30.0–36.0)
MCV: 93.1 fL (ref 80.0–100.0)
Platelets: 301 10*3/uL (ref 150–400)
RBC: 4.48 MIL/uL (ref 4.22–5.81)
RDW: 14.6 % (ref 11.5–15.5)
WBC: 9 10*3/uL (ref 4.0–10.5)
nRBC: 0 % (ref 0.0–0.2)

## 2021-09-27 LAB — MAGNESIUM: Magnesium: 2.1 mg/dL (ref 1.7–2.4)

## 2021-09-27 MED ORDER — FUROSEMIDE 40 MG PO TABS: 60.0000 mg | ORAL_TABLET | Freq: Two times a day (BID) | ORAL | 0 refills | Status: DC

## 2021-09-27 MED ORDER — POTASSIUM CHLORIDE CRYS ER 20 MEQ PO TBCR: 40.0000 meq | EXTENDED_RELEASE_TABLET | Freq: Two times a day (BID) | ORAL | 0 refills | Status: DC

## 2021-09-27 MED ORDER — POTASSIUM CHLORIDE CRYS ER 20 MEQ PO TBCR
40.0000 meq | EXTENDED_RELEASE_TABLET | Freq: Once | ORAL | Status: AC
  Administered 2021-09-27: 40 meq via ORAL
  Filled 2021-09-27: qty 2

## 2021-09-27 MED ORDER — POTASSIUM CHLORIDE CRYS ER 20 MEQ PO TBCR: 40.0000 meq | EXTENDED_RELEASE_TABLET | Freq: Two times a day (BID) | ORAL | Status: DC

## 2021-09-27 MED ORDER — IPRATROPIUM-ALBUTEROL 0.5-2.5 (3) MG/3ML IN SOLN: 3.0000 mL | Freq: Two times a day (BID) | RESPIRATORY_TRACT | Status: DC

## 2021-09-27 MED ORDER — FUROSEMIDE 20 MG PO TABS
60.0000 mg | ORAL_TABLET | Freq: Two times a day (BID) | ORAL | Status: DC
Start: 2021-09-27 — End: 2021-09-27

## 2021-09-27 MED ORDER — APIXABAN 5 MG PO TABS: ORAL_TABLET | ORAL | 0 refills | Status: DC

## 2021-09-27 NOTE — Progress Notes (Signed)
Pt reported scant amount of frank blood after BM. Pt reports history of hemorrhoids with banding. Pt is scheduled to get 10 mg Eliquis PO now. Pt is hesitant about taking the Eliquis. Provider notified. Provider suggests switching to heparin infusion and checking serial CBC, if pt is agreeable. Pt opts to keep original orders and take the PO Eliquis. Pt wants to discuss with AM provider before making change to anticoagulant.  ?

## 2021-09-27 NOTE — Plan of Care (Signed)
?  Problem: Education: ?Goal: Ability to demonstrate management of disease process will improve ?Outcome: Progressing ?Goal: Ability to verbalize understanding of medication therapies will improve ?Outcome: Progressing ?  ?Problem: Activity: ?Goal: Capacity to carry out activities will improve ?Outcome: Progressing ?  ?Problem: Respiratory: ?Goal: Ability to maintain adequate ventilation will improve ?Outcome: Progressing ?Goal: Ability to maintain a clear airway will improve ?Outcome: Progressing ?  ?

## 2021-09-27 NOTE — Progress Notes (Signed)
ANTICOAGULATION CONSULT NOTE  ? ?Pharmacy Consult for apixaban ?Indication: VTE treatment ? ?Allergies  ?Allergen Reactions  ? Bee Venom Swelling  ? Duloxetine Shortness Of Breath  ?  Brought on asthma  ? Ppd [Tuberculin Purified Protein Derivative] Other (See Comments)  ?  +ppd NEG Quantferron Gold 3/13 (shows false positive)   ? Fenofibrate Other (See Comments)  ?  Back pain  ? Verapamil Other (See Comments)  ?  Back pain  ? Claritin [Loratadine] Other (See Comments)  ?  UNKNOWN   ? Levofloxacin Diarrhea  ? Other Diarrhea  ?  "Some antibiotics" cause diarrhea  ? ? ?Patient Measurements: ?Height: 6' (182.9 cm) ?Weight: (!) 174.2 kg (384 lb) ?IBW/kg (Calculated) : 77.6 ?Heparin Dosing Weight:  ? ?Vital Signs: ?  ? ?Labs: ?Recent Labs  ?  09/24/21 ?1644 09/24/21 ?1844 09/25/21 ?1062 09/25/21 ?1150 09/26/21 ?6948 09/26/21 ?5462 09/27/21 ?0444  ?HGB 12.3*  --  12.7* 13.0  --  13.6 13.2  ?HCT 39.4  --  40.6 41.1  --  42.4 41.7  ?PLT 240  --  257 247  --  287 301  ?CREATININE 0.81  --  0.82  --  0.86  --  0.91  ?TROPONINIHS 10 10  --   --   --   --   --   ? ? ? ?Estimated Creatinine Clearance: 141.9 mL/min (by C-G formula based on SCr of 0.91 mg/dL). ? ? ?Medical History: ?Past Medical History:  ?Diagnosis Date  ? Anesthesia complication requiring reversal agent administration   ? ? from central apnea, very difficult to get off vent  ? Anxiety   ? Arthritis   ? osteo  ? Asthma   ? BPH (benign prostatic hyperplasia)   ? Complication of anesthesia   ? difficulty waking , they twlight me because of my respiratory problems "  ? Depression   ? Dyspnea   ? on exertion  ? Enlarged heart   ? Family history of adverse reaction to anesthesia   ? mother trouble waking up, and heart stopped  ? GERD (gastroesophageal reflux disease)   ? Headache   ? botox injections for headaches  ? Hyperlipidemia   ? Hypertension   ? Hypogonadism male   ? IBS (irritable bowel syndrome)   ? Memory difficulties   ? short term memories  ? Neuropathy    ? Obesity   ? On home oxygen therapy   ? on 2 liter  ? OSA (obstructive sleep apnea)   ? not using cpap  ? Paralysis ([place])   ? left hand - small  ? Pneumonia   ? Pre-diabetes   ? Prostatitis   ? ? ? ? ?Assessment: ? 61 y.o. male with medical history significant for COPD/asthma overlap syndrome, long COVID (05/2021) with chronic hypoxic respiratory failure on 2 L O2 via [place], HTN, HLD, depression/anxiety, memory issues, migraines, morbid obesity, OSA not using CPAP who presented to the ED for evaluation of shortness of breath. Found Right lower extremity venous ultrasound showed a small amount of nonocclusive thrombus within the right gastrocnemius vein. Pharmacy consulted to dose enoxaparin.  No prior AC noted ? ? ?Today,09/27/21 ?No reported issues with apixaban today  ?Hgb 13.2, plt 301  ?SCr 0.91 mg/dl  ?Will sign off from note writing, will continue to monitor  ? ?Goal of Therapy:  ?Anti-Xa level 0.6-1 units/ml 4hrs after LMWH dose given ?Monitor platelets by anticoagulation protocol: Yes ?  ?Plan:  ?Continue Apixaban 10 mg PO BID  x 7 days then 5 mg PO BID  ?Monitor SCr, CBC  ?Monitor for signs and symptoms of bleeding ? ? ? ?Royetta Asal, PharmD, BCPS ?09/27/2021 12:03 PM ? ? ? ?

## 2021-09-27 NOTE — Discharge Summary (Signed)
?Physician Discharge Summary ?  ?Patient name: Mark Harrell.  ?Admit date:     09/24/2021  ?Discharge date: 09/27/2021  ?Attending Physician: Flora Lipps [[id number]]  ?Discharge Physician: Kristopher Oppenheim  ? ?PCP: [name], MD  ? ? ? ?Recommendations at discharge: f/u with PCP for repeat BMP/Magnesium level next week to monitor diuresis/Scr. ? ?Discharge Diagnoses ?Principal Problem: ?  Shortness of breath ?Active Problems: ?  Acute deep vein thrombosis (DVT) of right lower extremity ([place]) ?  Asthma-COPD overlap syndrome ([place]) ?  Chronic respiratory failure with hypoxia (HCC) ?  Hypertension ?  Hyperlipidemia, mixed ?  Depression with anxiety ?  OSA (obstructive sleep apnea) ?  Dyspnea ?  Hypokalemia ? ? ?Resolved Diagnoses ?Resolved Problems: ?  * No resolved hospital problems. * ? ? ?Hospital Course   ?Mark Harrell. is a 61 y.o. male with past medical history significant for COPD/asthma overlap syndrome, long COVID (05/2021) with chronic hypoxic respiratory failure on 2 L O2 via [place], hypertension, hyperlipidemia depression/anxiety, memory issues, migraines, morbid obesity, OSA not using CPAP presented to hospital with progressive shortness of breath and dyspnea with increasing leg swelling.  Of note, patient was recently seen in the ED on 09/03/2021 and CT of the chest showed airspace opacities and was given Levaquin for 5 days.  Symptoms did not improve so he was restarted on Ceftin on 09/11/21.  Despite this, he continues to have persistent cough with productive sputum and dyspnea worse on exertion with progressive lower extremity edema.  Patient was on Lasix but had stopped it due to dyspnea from back-and-forth to the bathroom.  In the ED, patient was noted to have mild tachypnea with pulse ox of 93% on 3 L of oxygen by nasal cannula.  Creatinine was 0.8.  Hemoglobin at 12.3.  Troponin was negative.  BNP 86.  COVID and influenza was negative.  Chest x-ray showed retrocardiac infiltrate.  Ultrasound of the  lower extremity showed nonocclusive thrombus in the right gastrocnemius vein.  Patient was given Lasix and was admitted to the hospital for further evaluation and treatment. ? ?Assessment and Plan: ?Principal Problem: ?  Shortness of breath ?Active Problems: ?  Acute deep vein thrombosis (DVT) of right lower extremity ([place]) ?  Asthma-COPD overlap syndrome ([place]) ?  Chronic respiratory failure with hypoxia (HCC) ?  Hypertension ?  Hyperlipidemia, mixed ?  Depression with anxiety ?  OSA (obstructive sleep apnea) ?  Dyspnea ?  Hypokalemia ?  ?Shortness of breath/dyspnea ?Progressively worsening dyspnea likely multifactorial related to long COVID, morbid obesity, history of sleep apnea, retrocardiac  pneumonia, and anasarca suspect due to underlying acute diastolic CHF.  Pt started on  IV Rocephin and Zithromax.  He was diuresed with IV Lasix 60 mg twice daily with good diuresis.   CT angiogram of the chest showed moderate severity bilateral lower lobe airspace opacity but no mention of pulmonary embolism..  2D echocardiogram from 09/25/2021 showed elevation fraction of 70 to 75% with LVH, no regional wall motion abnormality, grade 1 diastolic dysfunction. Negative strep pneumonia antigen but  Legionella urinary antigen negative.  Procalcitonin was less than 0.10.  I think pt's abnormality on CT chest likely due to long covid and not from acute bacterial process. Will not be sending pt home with abd.  ? ?LE Edema ?Pt with LE edema. Pt states he has gained 30 lbs since Covid in jan 2023. Echo showed LVEF of 70%. No pulmonary hypertension. Pt thinks he was on decadron for  covid. Could have lead to water retention. Pt diuresed with IV lasix 60 mg bid. Pt states he never used the 80 mg lasix bid he was prescribed.  Continue with lasix. Will need BMP, Magnesium level in PCP office to monitor diuresis/Scr. ? ?Mild hypokalemia.  This was replete during hospital stay. Pt will go home with 40 meq bid of potassium due to lasix 60 mg  bid.  ?  ?Acute deep vein thrombosis (DVT) of right lower extremity ([place]) ?Small nonocclusive thrombus seen within the right gastrocnemius vein on venous ultrasound.  Initially On Lovenox.  transitioned to Eliquis at this time.  Spoke with the patient regarding anticoagulation.  Will likely need 6 weeks of anticoagulation. CTPA negative for PE. ?  ?Asthma-COPD overlap syndrome ([place]) ?Patient admitted for wheezing and dyspnea at this time though mild improvement.  Continue with Trelegy, Pulmicort, Singulair, DuoNebs and albuterol as needed.  Did not need systemic steroids. Pt has all inhaler and nebulizer treatments at home. ?  ?Chronic respiratory failure with hypoxia ([place]) ?On nasal cannula oxygen since January 2023.  Currently using 2 L by nasal cannula ?  ?Hypertension ?Continue bisoprolol-HCTZ from home.  Blood pressure seems to be stable. ?  ?OSA (obstructive sleep apnea) ?Not using CPAP due to reported intolerance. ?  ?Depression with anxiety ?Continue sertraline ?  ?Hyperlipidemia, mixed ?Continue pravastatin. ? ? ?Procedures performed: none  ? ?Condition at discharge: good ? ?Exam ?Physical Exam ?Vitals and nursing note reviewed.  ?Constitutional:   ?   General: He is not in acute distress. ?   Appearance: He is obese. He is not ill-appearing, toxic-appearing or diaphoretic.  ?HENT:  ?   Head: Normocephalic and atraumatic.  ?Cardiovascular:  ?   Rate and Rhythm: Normal rate and regular rhythm.  ?Pulmonary:  ?   Effort: Pulmonary effort is normal. No respiratory distress.  ?   Breath sounds: No wheezing.  ?Abdominal:  ?   General: There is no distension.  ?   Tenderness: There is no abdominal tenderness. There is no guarding.  ?Musculoskeletal:  ?   Right lower leg: 1+ Edema present.  ?   Left lower leg: 1+ Edema present.  ?Skin: ?   General: Skin is warm and dry.  ?   Capillary Refill: Capillary refill takes less than 2 seconds.  ?Neurological:  ?   General: No focal deficit present.  ?   Mental Status: He  is alert and oriented to person, place, and time.  ?  ? ?Disposition: Home ? ?Discharge time: greater than 30 minutes. ?Allergies as of 09/27/2021   ? ?   Reactions  ? Bee Venom Swelling  ? Duloxetine Shortness Of Breath  ? Brought on asthma  ? Ppd [tuberculin Purified Protein Derivative] Other (See Comments)  ? +ppd NEG Quantferron Gold 3/13 (shows false positive)   ? Fenofibrate Other (See Comments)  ? Back pain  ? Verapamil Other (See Comments)  ? Back pain  ? Claritin [loratadine] Other (See Comments)  ? UNKNOWN   ? Levofloxacin Diarrhea  ? Other Diarrhea  ? "Some antibiotics" cause diarrhea  ? ?  ? ?  ?Medication List  ?  ? ?STOP taking these medications   ? ?cefUROXime 500 MG tablet ?Commonly known as: CEFTIN ?  ?Eluxadoline 100 MG Tabs ?  ? ?  ? ?TAKE these medications   ? ?acetaminophen 500 MG tablet ?Commonly known as: TYLENOL ?Take 1,000 mg by mouth 3 (three) times daily. ?  ?anastrozole 1 MG tablet ?  Commonly known as: ARIMIDEX ?Take 1 mg by mouth daily. ?  ?apixaban 5 MG Tabs tablet ?Commonly known as: ELIQUIS ?10 mg bid x 5 days then 5 mg bid ?  ?ascorbic acid 1000 MG tablet ?Commonly known as: VITAMIN C ?Take 1,000 mg by mouth every evening. ?  ?Azelastine HCl 137 MCG/SPRAY Soln ?Place 2 sprays into both nostrils 2 (two) times daily. USE 1 TO 2 SPRAYS EACH NOSTRIL 2 X /DAY ?  ?benzonatate 200 MG capsule ?Commonly known as: TESSALON ?Take 1 capsule 3 x / day to Prevent Cough                                       /                           TAKE                              BY                 MOUTH ?What changed:  ?how much to take ?how to take this ?when to take this ?additional instructions ?  ?bisoprolol-hydrochlorothiazide 5-6.25 MG tablet ?Commonly known as: ZIAC ?TAKE 1 TABLET BY MOUTH EVERY DAY FOR BLOOD PRESSURE ?What changed:  ?how much to take ?how to take this ?when to take this ?additional instructions ?  ?Botulinum Toxin Type A 200 units Solr ?Inject 200 Units into the skin every 3 (three)  months. ?  ?budesonide 0.5 MG/2ML nebulizer solution ?Commonly known as: Pulmicort ?Take 2 mLs (0.5 mg total) by nebulization 3 (three) times daily as needed. ?What changed:  ?when to take this ?additional

## 2021-09-27 NOTE — TOC Transition Note (Signed)
Transition of Care (TOC) - CM/SW Discharge Note ? ? ?Patient Details  ?Name: Mark Harrell. ?MRN: [id number] ?Date of Birth: Jan 29, 1961 ? ?Transition of Care [facility]) CM/SW Contact:  ?Leeroy Cha, RN ?Phone Number: ?09/27/2021, 2:01 PM ? ? ?Clinical Narrative:    ?Patient dcd to home with no toc needs present. ? ? ?Final next level of care: [facility] ?Barriers to Discharge: No Barriers Identified ? ? ?Patient Goals and CMS Choice ?Patient states their goals for this hospitalization and ongoing recovery are:: to go home ?CMS Medicare.gov Compare Post Acute Care list provided to:: Patient ?Choice offered to / list presented to : Patient ? ?Discharge Placement ?  ?           ?  ?  ?  ?  ? ?Discharge Plan and Services ?  ?Discharge Planning Services: CM Consult, Other - See comment (home hf program) ?Post Acute Care Choice: Home Health          ?  ?  ?  ?  ?  ?HH Arranged: Disease Management ?[place] Agency: [place] ?Date HH Agency Contacted: 09/25/21 ?Time [place]: [id number] ?Representative spoke with at [place]: amy hyatt ? ?Social Determinants of Health (SDOH) Interventions ?  ? ? ?Readmission Risk Interventions ?   ? View : No data to display.  ?  ?  ?  ? ? ? ? ? ?

## 2021-09-28 LAB — CULTURE, RESPIRATORY W GRAM STAIN: Culture: NORMAL

## 2021-09-30 DIAGNOSIS — G43719 Chronic migraine without aura, intractable, without status migrainosus: Secondary | ICD-10-CM | POA: Diagnosis not present

## 2021-10-02 ENCOUNTER — Ambulatory Visit (INDEPENDENT_AMBULATORY_CARE_PROVIDER_SITE_OTHER): Payer: BC Managed Care – PPO | Admitting: Nurse Practitioner

## 2021-10-02 VITALS — BP 110/58 | HR 107 | Temp 97.9°F

## 2021-10-02 DIAGNOSIS — J9611 Chronic respiratory failure with hypoxia: Secondary | ICD-10-CM | POA: Diagnosis not present

## 2021-10-02 DIAGNOSIS — I82461 Acute embolism and thrombosis of right calf muscular vein: Secondary | ICD-10-CM

## 2021-10-02 DIAGNOSIS — Z79899 Other long term (current) drug therapy: Secondary | ICD-10-CM | POA: Diagnosis not present

## 2021-10-02 DIAGNOSIS — E782 Mixed hyperlipidemia: Secondary | ICD-10-CM | POA: Diagnosis not present

## 2021-10-02 DIAGNOSIS — I10 Essential (primary) hypertension: Secondary | ICD-10-CM

## 2021-10-02 DIAGNOSIS — J449 Chronic obstructive pulmonary disease, unspecified: Secondary | ICD-10-CM | POA: Diagnosis not present

## 2021-10-02 DIAGNOSIS — G4733 Obstructive sleep apnea (adult) (pediatric): Secondary | ICD-10-CM

## 2021-10-02 DIAGNOSIS — R790 Abnormal level of blood mineral: Secondary | ICD-10-CM

## 2021-10-02 DIAGNOSIS — E876 Hypokalemia: Secondary | ICD-10-CM

## 2021-10-02 DIAGNOSIS — R609 Edema, unspecified: Secondary | ICD-10-CM

## 2021-10-02 DIAGNOSIS — Z09 Encounter for follow-up examination after completed treatment for conditions other than malignant neoplasm: Secondary | ICD-10-CM

## 2021-10-02 DIAGNOSIS — R0602 Shortness of breath: Secondary | ICD-10-CM | POA: Diagnosis not present

## 2021-10-02 NOTE — Progress Notes (Signed)
Hospital follow up  1. Hospital discharge follow-up All hospital encounter notes including diagnostics and labs reviewed in detail. All questions and concerns addressed.  - COMPLETE METABOLIC PANEL WITH GFR - Magnesium  2. Chronic respiratory failure with hypoxia (HCC) Continue supplemental oxygen. Continue inhalers, nebulizer's Contact 911 or report to ER for any increase in difficulty breathing.  3. Acute deep vein thrombosis (DVT) of calf muscle vein of right lower extremity (HCC) Small nonocclusive thrombus seen within the right gastrocnemius vein on venous ultrasound.  CTPA negative for PE Continue 6 week of Eliquis as directed. SE discussed.  4. Asthma-COPD overlap syndrome (HCC) Continue supplemental oxygen. Continue inhalers, nebulizer's Contact 911 or report to ER for any increase in difficulty breathing.  5. Shortness of breath Multifactorial related to long COVID, retrocardiac opacity on CXR concerning for pneumonia, and anasarca suspect due to underlying CHF Continue with Trelegy, Pulmicort, Singulair, DuoNebs and albuterol as needed. Continue supplemental oxygen. Contact 911 or report to ER for any increase in difficulty breathing.  6. OSA (obstructive sleep apnea) Not using CPAP due to reported intolerance. Suggest new fitting for CPAP considering beard. Continue to monitor.  7. Primary hypertension Continue bisoprolol-HCTZ. Continue to monitor  8. Hyperlipidemia, mixed Continue Pravastatin. Weight loss. Dietary modifications. Continue to monitor.  9. Edema, unspecified type Elevated BLE  Consider compression stockings. Limit salt intake.  - COMPLETE METABOLIC PANEL WITH GFR  10. Hypokalemia  - COMPLETE METABOLIC PANEL WITH GFR - Magnesium  11. Low magnesium level  - COMPLETE METABOLIC PANEL WITH GFR - Magnesium   All medications were reviewed with patient and family and fully reconciled. All questions answered fully, and patient and  family members were encouraged to call the office with any further questions or concerns. Discussed goal to avoid readmission related to this diagnosis.   Over 40 minutes of exam, counseling, chart review, and complex, high/moderate level critical decision making was performed this visit.   Future Appointments  Date Time Provider [place]  11/12/2021  3:45 PM [name], MD [name]  11/13/2021  3:30 PM Liane Comber, NP [name]  02/18/2022  3:00 PM [name], MD [name]     HPI 61 y.o.male presents for follow up for transition from recent hospitalization or [facility] stay. Admit date to the hospital was 10/03/21, patient was discharged from the hospital on 10/08/21 and our clinical staff contacted the office the day after discharge to set up a follow up appointment. The discharge summary, medications, and diagnostic test results were reviewed before meeting with the patient. The patient was admitted for:   Mark Harrell. is a 61 y.o. male presents with friend.  Has medical history significant for COPD/asthma overlap syndrome, long COVID (05/2021) with chronic hypoxic respiratory failure on 2 L O2 via [place], HTN, HLD, depression/anxiety, memory issues, migraines, morbid obesity, OSA not using CPAP who presented to the ED for evaluation of shortness of breath.  Patient recently seen in the ED 09/03/2021 at which time CTA chest showed airspace opacities in the lower lobes.  He was treated with Levaquin for 5 days.  Symptoms did not improve and he was started on Ceftin on 4/19.  He was then admitted for observation for further treatment and evaluation.   He has continued dyspnea, worse with minimal exertion.  He has had progressive lower extremity edema over the last 6 weeks.  He states that he was taking Lasix but had to stop as he was becoming too dyspneic to  get back and forth from the bathroom when urinating.  He reports intermittent fevers.  SARS-CoV-2 and  influenza PCR negative.   2 view chest x-ray showed retrocardiac infiltrate.  Right lower extremity venous ultrasound showed a small amount of nonocclusive thrombus within the right gastrocnemius vein.  During admission patient was treated for SOB secondary to multifactorial long COVID, retrocardiac opacity on CXR concerning for PNA and anasarca suspect d/t underlying CHF.  Treatment consisted of Ceftriaxone and Azithromycin, IV Lasix.  CTA resulted as  CT angiogram of the chest showed moderate severity bilateral lower lobe airspace opacity but no mention of pulmonary embolism, Echo resulted as  2D echocardiogram from 09/25/2021 showed elevation fraction of 70 to 75% with LVH, no regional wall motion abnormality, grade 1 diastolic dysfunction, Sputum Cx resulted as Negative strep pneumonia antigen but  Legionella urinary antigen negative.   He was also noted to have DVT of RLE that showed on [place].  He was started on Lovenox and CTA and PE revealed to be negative.  He was started on Eliquis for 6 weeks.  He is continuing to take as directed.  Asthma/COPD was treated with home Telegy, Pulmicort, Singulair, DuoNebs, Albuterol PRN.  He is continuing to take these as directed.  Continues to have course rhonchi with productive cough.   Chronic respiroatyr failure with hypoxia was treated via 2L O2 via [place]. He continues to be on supplemental oxygen with oxygen saturation at 94% on 5L.  Unable to tolerate anything less d/t saturation dropping to <88% in home. Last had covid 05/2021.  Hypertesnion was treated with Bisoprolol-HCTZ  OSA was not treated d/t patient unable to tolerate.  Depression with anxiety was treated with Sertraline.  Hyperlipidemia was treated with Pravastatin.   Home health is not involved.   Images while in the hospital: DG Chest 2 View  Result Date: 09/24/2021 CLINICAL DATA:  Worsening shortness of breath and shortness of breath with exertion, subjective fever at home, productive  cough, history COPD, asthma EXAM: CHEST - 2 VIEW COMPARISON:  09/03/2021 FINDINGS: Enlargement of cardiac silhouette with pulmonary vascular congestion. Minimal atelectasis at RIGHT base. Retrocardiac infiltrate consistent with pneumonia. No pleural effusion or pneumothorax. Prior cervical spine fusion. IMPRESSION: Enlargement of cardiac silhouette with pulmonary vascular congestion. Retrocardiac consolidation consistent with LEFT lower lobe pneumonia. Electronically Signed   By: Lavonia Dana M.D.   On: 09/24/2021 16:34   CT Angio Chest Pulmonary Embolism (PE) W or WO Contrast  Result Date: 09/25/2021 CLINICAL DATA:  Persistent cough and dyspnea. EXAM: CT ANGIOGRAPHY CHEST WITH CONTRAST TECHNIQUE: Multidetector CT imaging of the chest was performed using the standard protocol during bolus administration of intravenous contrast. Multiplanar CT image reconstructions and MIPs were obtained to evaluate the vascular anatomy. RADIATION DOSE REDUCTION: This exam was performed according to the departmental dose-optimization program which includes automated exposure control, adjustment of the mA and/or kV according to patient size and/or use of iterative reconstruction technique. CONTRAST:  163m OMNIPAQUE IOHEXOL 350 MG/ML SOLN COMPARISON:  September 03, 2021 FINDINGS: Cardiovascular: The pulmonary arteries are limited in evaluation secondary to suboptimal opacification with intravenous contrast. The heart size is along the upper limits of normal. No pericardial effusion. Mediastinum/Nodes: There is mild pretracheal, AP window and right hilar lymphadenopathy. Thyroid gland, trachea, and esophagus demonstrate no significant findings. Lungs/Pleura: Patchy, moderate severity areas of airspace disease are seen within the posteromedial aspects of the bilateral lower lobes and bilateral lung bases. This is mildly increased in severity when compared to the  prior study. There is no evidence of a pleural effusion or pneumothorax.  Upper Abdomen: No acute abnormality. Musculoskeletal: Multilevel degenerative changes seen throughout the thoracic spine. Review of the MIP images confirms the above findings. IMPRESSION: Moderate severity bilateral lower lobe airspace disease, mildly increased in severity when compared to the prior study. Electronically Signed   By: Virgina Norfolk M.D.   On: 09/25/2021 01:30   US Venous Img Lower Unilateral Right  Result Date: 09/24/2021 CLINICAL DATA:  Right lower extremity edema and shortness of breath. EXAM: RIGHT LOWER EXTREMITY VENOUS DOPPLER ULTRASOUND TECHNIQUE: Gray-scale sonography with graded compression, as well as color Doppler and duplex ultrasound were performed to evaluate the lower extremity deep venous systems from the level of the common femoral vein and including the common femoral, femoral, profunda femoral, popliteal and calf veins including the posterior tibial, peroneal and gastrocnemius veins when visible. The superficial great saphenous vein was also interrogated. Spectral Doppler was utilized to evaluate flow at rest and with distal augmentation maneuvers in the common femoral, femoral and popliteal veins. COMPARISON:  None Available. FINDINGS: Contralateral Common Femoral Vein: Respiratory phasicity is normal and symmetric with the symptomatic side. No evidence of thrombus. Normal compressibility. Common Femoral Vein: No evidence of thrombus. Normal compressibility, respiratory phasicity and response to augmentation. Saphenofemoral Junction: No evidence of thrombus. Normal compressibility and flow on color Doppler imaging. Profunda Femoral Vein: No evidence of thrombus. Normal compressibility and flow on color Doppler imaging. Femoral Vein: No evidence of thrombus. Normal compressibility, respiratory phasicity and response to augmentation. Popliteal Vein: No evidence of thrombus. Normal compressibility, respiratory phasicity and response to augmentation. Calf Veins: No evidence of  thrombus within the RIGHT posterior tibial vein or RIGHT peroneal vein. A small amount of nonocclusive thrombus is seen within the RIGHT gastrocnemius vein with abnormal compressibility and flow on color Doppler imaging. Superficial Great Saphenous Vein: No evidence of thrombus. Normal compressibility. Venous Reflux:  None. Other Findings:  None. IMPRESSION: Small amount of nonocclusive thrombus within the RIGHT gastrocnemius vein. Electronically Signed   By: Virgina Norfolk M.D.   On: 09/24/2021 19:47   ECHOCARDIOGRAM COMPLETE  Result Date: 09/25/2021    ECHOCARDIOGRAM REPORT   Patient Name:   Mark Harrell. Date of Exam: 09/25/2021 Medical Rec #:  [id number]        Height:       72.0 in Accession #:    [id number]       Weight:       384.9 lb Date of Birth:  09-14-1960        BSA:          2.814 m Patient Age:    19 years         BP:           122/76 mmHg Patient Gender: M                HR:           90 bpm. Exam Location:  Inpatient Procedure: 2D Echo, Cardiac Doppler and Color Doppler Indications:    CHF  History:        Patient has prior history of Echocardiogram examinations, most                 recent 02/23/2017. COPD; Risk Factors:Sleep Apnea and                 Hypertension.  Sonographer:    Jefferey Pica Referring Phys: [id number] [place]  IMPRESSIONS  1. Left ventricular ejection fraction, by estimation, is 70 to 75%. The left ventricle has hyperdynamic function. The left ventricle has no regional wall motion abnormalities. There is moderate left ventricular hypertrophy. Left ventricular diastolic parameters are consistent with Grade I diastolic dysfunction (impaired relaxation).  2. Right ventricular systolic function is normal. The right ventricular size is normal.  3. The mitral valve is normal in structure. No evidence of mitral valve regurgitation. No evidence of mitral stenosis.  4. The aortic valve is normal in structure. Aortic valve regurgitation is not visualized. No aortic stenosis is  present.  5. The inferior vena cava is normal in size with greater than 50% respiratory variability, suggesting right atrial pressure of 3 mmHg. FINDINGS  Left Ventricle: Left ventricular ejection fraction, by estimation, is 70 to 75%. The left ventricle has hyperdynamic function. The left ventricle has no regional wall motion abnormalities. The left ventricular internal cavity size was normal in size. There is moderate left ventricular hypertrophy. Left ventricular diastolic parameters are consistent with Grade I diastolic dysfunction (impaired relaxation). Right Ventricle: The right ventricular size is normal. No increase in right ventricular wall thickness. Right ventricular systolic function is normal. Left Atrium: Left atrial size was normal in size. Right Atrium: Right atrial size was normal in size. Pericardium: There is no evidence of pericardial effusion. Mitral Valve: The mitral valve is normal in structure. No evidence of mitral valve regurgitation. No evidence of mitral valve stenosis. Tricuspid Valve: The tricuspid valve is normal in structure. Tricuspid valve regurgitation is not demonstrated. No evidence of tricuspid stenosis. Aortic Valve: The aortic valve is normal in structure. Aortic valve regurgitation is not visualized. No aortic stenosis is present. Aortic valve peak gradient measures 10.5 mmHg. Pulmonic Valve: The pulmonic valve was normal in structure. Pulmonic valve regurgitation is not visualized. No evidence of pulmonic stenosis. Aorta: The aortic root is normal in size and structure. Venous: The inferior vena cava is normal in size with greater than 50% respiratory variability, suggesting right atrial pressure of 3 mmHg. IAS/Shunts: No atrial level shunt detected by color flow Doppler.  LEFT VENTRICLE PLAX 2D LVIDd:         5.20 cm   Diastology LVIDs:         2.90 cm   LV e' lateral:   5.19 cm/s LV PW:         1.40 cm   LV E/e' lateral: 16.5 LV IVS:        1.50 cm LVOT diam:     2.20 cm  LV SV:         109 LV SV Index:   39 LVOT Area:     3.80 cm  IVC IVC diam: 2.80 cm LEFT ATRIUM              Index LA diam:        3.60 cm  1.28 cm/m LA Vol (A2C):   125.0 ml 44.42 ml/m LA Vol (A4C):   73.9 ml  26.26 ml/m LA Biplane Vol: 97.4 ml  34.61 ml/m  AORTIC VALVE                  PULMONIC VALVE AV Area (Vmax): 3.33 cm      PV Vmax:       0.79 m/s AV Vmax:        162.00 cm/s   PV Peak grad:  2.5 mmHg AV Peak Grad:   10.5 mmHg LVOT Vmax:  142.00 cm/s LVOT Vmean:     102.000 cm/s LVOT VTI:       0.286 m  AORTA Ao Root diam: 3.60 cm Ao Asc diam:  3.90 cm MITRAL VALVE MV Area (PHT): 3.79 cm    SHUNTS MV Decel Time: 200 msec    Systemic VTI:  0.29 m MV E velocity: 85.50 cm/s  Systemic Diam: 2.20 cm MV A velocity: 80.60 cm/s MV E/A ratio:  1.06 Candee Furbish MD Electronically signed by Candee Furbish MD Signature Date/Time: 09/25/2021/10:46:06 AM    Final      Current Outpatient Medications (Endocrine & Metabolic):    metFORMIN (GLUCOPHAGE-XR) 500 MG 24 hr tablet, Takes 2 tablets  2 x /day  with Meals  for Diabetes (Patient taking differently: Take 1,000 mg by mouth 2 (two) times daily.)  Current Outpatient Medications (Cardiovascular):    EPINEPHrine 0.3 mg/0.3 mL IJ SOAJ injection, Inject 0.3 mg into the muscle as needed. (Patient taking differently: Inject 0.3 mg into the muscle as needed for anaphylaxis.)   pravastatin (PRAVACHOL) 40 MG tablet, TAKE 1 TABLET BY MOUTH AT BEDTIME FOR CHOLESTEROL (Patient taking differently: Take 40 mg by mouth daily.)   tadalafil (CIALIS) 5 MG tablet, Take 5 mg by mouth daily.    furosemide (LASIX) 80 MG tablet, Take 80 mg by mouth 2 (two) times daily.   metoprolol tartrate (LOPRESSOR) 25 MG tablet, Take 1 tablet (25 mg total) by mouth 2 (two) times daily.  Current Outpatient Medications (Respiratory):    Azelastine HCl 137 MCG/SPRAY SOLN, Place 2 sprays into both nostrils 2 (two) times daily. USE 1 TO 2 SPRAYS EACH NOSTRIL 2 X /DAY (Patient taking differently:  Place 2 sprays into both nostrils 2 (two) times daily.)   benzonatate (TESSALON) 200 MG capsule, Take 1 capsule 3 x / day to Prevent Cough                                       /                           TAKE                              BY                 MOUTH (Patient taking differently: Take 200 mg by mouth 3 (three) times daily.)   budesonide (PULMICORT) 0.5 MG/2ML nebulizer solution, Take 2 mLs (0.5 mg total) by nebulization 3 (three) times daily as needed. (Patient taking differently: Take 0.5 mg by nebulization 2 (two) times daily.)   cetirizine (ZYRTEC) 10 MG tablet, Take 1 tablet (10 mg total) by mouth 2 (two) times daily as needed for allergies (Can take an extra dose during flares). (Patient taking differently: Take 10 mg by mouth in the morning.)   fluticasone (FLONASE) 50 MCG/ACT nasal spray, Place 2 sprays into both nostrils daily. Use 1 to 2 sprays each Nostril 2 x /day (Patient taking differently: Place 2 sprays into both nostrils in the morning and at bedtime.)   Fluticasone-Umeclidin-Vilant (TRELEGY ELLIPTA) 100-62.5-25 MCG/ACT AEPB, Inhale 1 puff into the lungs daily.   Guaifenesin 1200 MG TB12, Take 1,200 mg by mouth 3 (three) times daily.   ipratropium-albuterol (DUONEB) 0.5-2.5 (3) MG/3ML SOLN, TAKE 3 MLS BY NEBULIZATION EVERY 4 (  FOUR) HOURS AS NEEDED. (Patient taking differently: Take 6 mLs by nebulization every 4 (four) hours as needed (shortess of breath).)   montelukast (SINGULAIR) 10 MG tablet, TAKE 1 TABLET BY MOUTH DAILY FOR ALLERGIES (Patient taking differently: Take 10 mg by mouth at bedtime.)   oxymetazoline (AFRIN) 0.05 % nasal spray, Place 1 spray into both nostrils at bedtime.   promethazine-dextromethorphan (PROMETHAZINE-DM) 6.25-15 MG/5ML syrup, Take  1 teaspoon (5 ml)  every 4 hours  as needed  for Cough                                   /                              TAKE            BY                MOUTH (Patient taking differently: Take 5 mLs by mouth 4 (four)  times daily as needed for cough.)   sodium chloride (OCEAN) 0.65 % SOLN nasal spray, Place 1 spray into both nostrils 4 (four) times daily as needed for congestion. Uses each time before other nasal sprays   VENTOLIN HFA 108 (90 Base) MCG/ACT inhaler, INHALE 2 PUFFS BY MOUTH EVERY 4 HOURS AS NEEDED FOR WHEEZE OR FOR SHORTNESS OF BREATH (Patient taking differently: Inhale 2 puffs into the lungs every 4 (four) hours as needed for wheezing or shortness of breath.)   DM-APAP-CPM (CORICIDIN HBP MAX STRENGTH FLU PO), Take 1 tablet by mouth 3 (three) times daily.  Current Outpatient Medications (Analgesics):    acetaminophen (TYLENOL) 500 MG tablet, Take 1,000 mg by mouth 3 (three) times daily.   celecoxib (CELEBREX) 200 MG capsule, TAKE 1 CAPSULE (200 MG TOTAL) BY MOUTH EVERY 12 (TWELVE) HOURS. (Patient taking differently: Take 200 mg by mouth 2 (two) times daily.)   aspirin EC 81 MG tablet, Take 81 mg by mouth daily. Swallow whole.  Current Outpatient Medications (Hematological):    apixaban (ELIQUIS) 5 MG TABS tablet, Take 1 tablet (5 mg total) by mouth 2 (two) times daily.  Current Outpatient Medications (Other):    anastrozole (ARIMIDEX) 1 MG tablet, Take 1 mg by mouth daily.   ascorbic acid (VITAMIN C) 1000 MG tablet, Take 1,000 mg by mouth every evening.   Botulinum Toxin Type A 200 units SOLR, Inject 200 Units into the skin every 3 (three) months.   cetaphil (CETAPHIL) lotion, Apply topically 2 (two) times daily. (Patient taking differently: Apply 1 application. topically 2 (two) times daily.)   Cholecalciferol (VITAMIN D3) 125 MCG (5000 UT) TABS, Take 10,000 Units by mouth 2 (two) times daily. Morning and evening   Cinnamon 500 MG capsule, Take 1,000 mg by mouth 3 (three) times daily.   diclofenac Sodium (VOLTAREN) 1 % GEL, APPLY 2 TO 4 GRAMS TOPICALLY 2 TO 4 TIMES DAILY FOR PAIN & INFLAMMATION( OTC NOT COVERED) (Patient taking differently: 2-4 g 2 (two) times daily as needed (pain).)    donepezil (ARICEPT) 23 MG TABS tablet, Take 23 mg by mouth daily.   dupilumab (DUPIXENT) 300 MG/2ML prefilled syringe, Inject 300 mg into the skin every 14 (fourteen) days.   Krill Oil 500 MG CAPS, Take 500 mg by mouth 2 (two) times daily.   Magnesium 500 MG CAPS, Take 500 mg by mouth 3 (three) times daily.  memantine (NAMENDA) 10 MG tablet, Take 10 mg by mouth 2 (two) times daily.   Misc Natural Products (GLUCOS-CHONDROIT-MSM COMPLEX) TABS, Take 2 tablets by mouth 3 (three) times daily.   Multiple Vitamins-Minerals (MULTIVITAMIN WITH MINERALS) tablet, Take 1 tablet by mouth daily.   Multiple Vitamins-Minerals (PRESERVISION AREDS 2) CAPS, Take 1 capsule by mouth 2 (two) times daily.   pentosan polysulfate (ELMIRON) 100 MG capsule, Take 100 mg by mouth 3 (three) times daily.   potassium chloride SA (KLOR-CON M) 20 MEQ tablet, Take 2 tablets (40 mEq total) by mouth 2 (two) times daily.   pregabalin (LYRICA) 150 MG capsule, Take  1 capsule  3 x /day for Chronic Pain                                          /                          TAKE                              BY               MOUTH (Patient taking differently: Take 150 mg by mouth 3 (three) times daily.)   PROBIOTIC PRODUCT PO, Take 1 capsule by mouth 2 (two) times daily. VSL   sertraline (ZOLOFT) 100 MG tablet, Take 100 mg by mouth 2 (two) times daily.   trospium (SANCTURA) 20 MG tablet, Take 20 mg by mouth 2 (two) times daily. In the evening and at bedtime   TURMERIC PO, Take 2 tablets by mouth 3 (three) times daily.   zinc gluconate 50 MG tablet, Take 50 mg by mouth 3 (three) times daily.   Calcium Carb-Cholecalciferol (CALCIUM + VITAMIN D3 PO), Take 1 tablet by mouth daily.   Coenzyme Q10 (CO Q 10 PO), Take 600 mg by mouth daily.   cyclobenzaprine (FLEXERIL) 10 MG tablet, TAKE 1/2 TO 1 TABLET 3 X DAY ONLY IF NEED FOR MUSCLE SPASM (NECK) (Patient taking differently: Take 10 mg by mouth 3 (three) times daily.)   doxycycline (ADOXA) 100 MG  tablet, Take 100 mg by mouth 2 (two) times daily. prophylactic  Past Medical History:  Diagnosis Date   Anesthesia complication requiring reversal agent administration    ? from central apnea, very difficult to get off vent   Anxiety    Arthritis    osteo   Asthma    BPH (benign prostatic hyperplasia)    Complication of anesthesia    difficulty waking , they twlight me because of my respiratory problems "   Depression    Dyspnea    on exertion   Enlarged heart    Family history of adverse reaction to anesthesia    mother trouble waking up, and heart stopped   GERD (gastroesophageal reflux disease)    Headache    botox injections for headaches   Hyperlipidemia    Hypertension    Hypogonadism male    IBS (irritable bowel syndrome)    Memory difficulties    short term memories   Neuropathy    Obesity    On home oxygen therapy    on 2 liter   OSA (obstructive sleep apnea)    not using cpap   Paralysis (HCC)    left  hand - small   Pneumonia    Pre-diabetes    Prostatitis      Allergies  Allergen Reactions   Bee Venom Swelling   Cymbalta [Duloxetine Hcl] Shortness Of Breath    Brought on asthma   Ppd [Tuberculin Purified Protein Derivative] Other (See Comments)    +ppd NEG Quantferron Gold 3/13 (shows false positive)    [place]an [Verapamil] Other (See Comments)    Back pain   Tricor [Fenofibrate] Other (See Comments)    Back pain   Claritin [Loratadine] Other (See Comments)    Unknown reaction    Levaquin [Levofloxacin] Diarrhea    ROS: all negative except above.   Physical Exam: There were no vitals filed for this visit. BP (!) 110/58   Pulse (!) 107   Temp 97.9 F (36.6 C)   SpO2 94% Comment: with oxygen-5 liters General Appearance: Well nourished, in no apparent distress. Eyes: PERRLA, EOMs, conjunctiva no swelling or erythema Sinuses: No Frontal/maxillary tenderness ENT/Mouth: Ext aud canals clear, TMs without erythema, bulging. No erythema, swelling,  or exudate on post pharynx.  Tonsils not swollen or erythematous. Hearing normal.  Neck: Supple, thyroid normal.  Respiratory:  Course rhonchi throughout lung field upon inspiration and expiration. 5L [place] via supplemental oxygen.  Respiratory effort normal Cardio: RRR with no MRGs. Brisk peripheral pulses with+2 BLE edema.  Abdomen: Soft, + BS.  Non tender, no guarding, rebound, hernias, masses. Lymphatics: Non tender without lymphadenopathy.  Musculoskeletal: Full ROM, 5/5 strength, normal gait.  Skin: Warm, dry without rashes, lesions, ecchymosis.  Neuro: Cranial nerves intact. Normal muscle tone, no cerebellar symptoms. Sensation intact.  Psych: Awake and oriented X 3, normal affect, Insight and Judgment appropriate.     Darrol Jump, NP 1:22 PM [facility]

## 2021-10-03 ENCOUNTER — Encounter (HOSPITAL_BASED_OUTPATIENT_CLINIC_OR_DEPARTMENT_OTHER): Payer: Self-pay | Admitting: Emergency Medicine

## 2021-10-03 ENCOUNTER — Other Ambulatory Visit: Payer: Self-pay

## 2021-10-03 ENCOUNTER — Other Ambulatory Visit: Payer: Self-pay | Admitting: Internal Medicine

## 2021-10-03 ENCOUNTER — Inpatient Hospital Stay (HOSPITAL_BASED_OUTPATIENT_CLINIC_OR_DEPARTMENT_OTHER)
Admission: EM | Admit: 2021-10-03 | Discharge: 2021-10-08 | DRG: 193 | Disposition: A | Discharge status: Home Health | Payer: BC Managed Care – PPO | Attending: Internal Medicine | Admitting: Internal Medicine

## 2021-10-03 ENCOUNTER — Emergency Department (HOSPITAL_BASED_OUTPATIENT_CLINIC_OR_DEPARTMENT_OTHER): Payer: BC Managed Care – PPO

## 2021-10-03 DIAGNOSIS — Z79811 Long term (current) use of aromatase inhibitors: Secondary | ICD-10-CM

## 2021-10-03 DIAGNOSIS — Z881 Allergy status to other antibiotic agents status: Secondary | ICD-10-CM | POA: Diagnosis not present

## 2021-10-03 DIAGNOSIS — Z86718 Personal history of other venous thrombosis and embolism: Secondary | ICD-10-CM

## 2021-10-03 DIAGNOSIS — E662 Morbid (severe) obesity with alveolar hypoventilation: Secondary | ICD-10-CM | POA: Diagnosis present

## 2021-10-03 DIAGNOSIS — I10 Essential (primary) hypertension: Secondary | ICD-10-CM | POA: Diagnosis not present

## 2021-10-03 DIAGNOSIS — N400 Enlarged prostate without lower urinary tract symptoms: Secondary | ICD-10-CM | POA: Diagnosis present

## 2021-10-03 DIAGNOSIS — Z79899 Other long term (current) drug therapy: Secondary | ICD-10-CM

## 2021-10-03 DIAGNOSIS — J9601 Acute respiratory failure with hypoxia: Secondary | ICD-10-CM | POA: Diagnosis not present

## 2021-10-03 DIAGNOSIS — Z6841 Body Mass Index (BMI) 40.0 and over, adult: Secondary | ICD-10-CM | POA: Diagnosis not present

## 2021-10-03 DIAGNOSIS — I110 Hypertensive heart disease with heart failure: Secondary | ICD-10-CM | POA: Diagnosis not present

## 2021-10-03 DIAGNOSIS — I82461 Acute embolism and thrombosis of right calf muscular vein: Secondary | ICD-10-CM | POA: Diagnosis not present

## 2021-10-03 DIAGNOSIS — Z7901 Long term (current) use of anticoagulants: Secondary | ICD-10-CM

## 2021-10-03 DIAGNOSIS — Z888 Allergy status to other drugs, medicaments and biological substances status: Secondary | ICD-10-CM | POA: Diagnosis not present

## 2021-10-03 DIAGNOSIS — Z91030 Bee allergy status: Secondary | ICD-10-CM | POA: Diagnosis not present

## 2021-10-03 DIAGNOSIS — J398 Other specified diseases of upper respiratory tract: Secondary | ICD-10-CM | POA: Diagnosis present

## 2021-10-03 DIAGNOSIS — R7303 Prediabetes: Secondary | ICD-10-CM | POA: Diagnosis not present

## 2021-10-03 DIAGNOSIS — Z87891 Personal history of nicotine dependence: Secondary | ICD-10-CM

## 2021-10-03 DIAGNOSIS — J9621 Acute and chronic respiratory failure with hypoxia: Secondary | ICD-10-CM | POA: Diagnosis present

## 2021-10-03 DIAGNOSIS — K219 Gastro-esophageal reflux disease without esophagitis: Secondary | ICD-10-CM | POA: Diagnosis present

## 2021-10-03 DIAGNOSIS — J449 Chronic obstructive pulmonary disease, unspecified: Secondary | ICD-10-CM | POA: Diagnosis not present

## 2021-10-03 DIAGNOSIS — I5032 Chronic diastolic (congestive) heart failure: Secondary | ICD-10-CM | POA: Diagnosis not present

## 2021-10-03 DIAGNOSIS — F32A Depression, unspecified: Secondary | ICD-10-CM | POA: Diagnosis not present

## 2021-10-03 DIAGNOSIS — N179 Acute kidney failure, unspecified: Secondary | ICD-10-CM | POA: Diagnosis present

## 2021-10-03 DIAGNOSIS — Z8249 Family history of ischemic heart disease and other diseases of the circulatory system: Secondary | ICD-10-CM

## 2021-10-03 DIAGNOSIS — M351 Other overlap syndromes: Secondary | ICD-10-CM | POA: Diagnosis not present

## 2021-10-03 DIAGNOSIS — J45901 Unspecified asthma with (acute) exacerbation: Secondary | ICD-10-CM | POA: Diagnosis present

## 2021-10-03 DIAGNOSIS — Z8616 Personal history of COVID-19: Secondary | ICD-10-CM

## 2021-10-03 DIAGNOSIS — Z8701 Personal history of pneumonia (recurrent): Secondary | ICD-10-CM

## 2021-10-03 DIAGNOSIS — R918 Other nonspecific abnormal finding of lung field: Secondary | ICD-10-CM | POA: Diagnosis not present

## 2021-10-03 DIAGNOSIS — I82401 Acute embolism and thrombosis of unspecified deep veins of right lower extremity: Secondary | ICD-10-CM | POA: Diagnosis present

## 2021-10-03 DIAGNOSIS — Z981 Arthrodesis status: Secondary | ICD-10-CM

## 2021-10-03 DIAGNOSIS — Z20822 Contact with and (suspected) exposure to covid-19: Secondary | ICD-10-CM | POA: Diagnosis present

## 2021-10-03 DIAGNOSIS — J441 Chronic obstructive pulmonary disease with (acute) exacerbation: Secondary | ICD-10-CM | POA: Diagnosis present

## 2021-10-03 DIAGNOSIS — E785 Hyperlipidemia, unspecified: Secondary | ICD-10-CM | POA: Diagnosis present

## 2021-10-03 DIAGNOSIS — J8410 Pulmonary fibrosis, unspecified: Secondary | ICD-10-CM | POA: Diagnosis present

## 2021-10-03 DIAGNOSIS — J440 Chronic obstructive pulmonary disease with acute lower respiratory infection: Secondary | ICD-10-CM | POA: Diagnosis present

## 2021-10-03 DIAGNOSIS — G629 Polyneuropathy, unspecified: Secondary | ICD-10-CM | POA: Diagnosis present

## 2021-10-03 DIAGNOSIS — J9611 Chronic respiratory failure with hypoxia: Secondary | ICD-10-CM | POA: Diagnosis present

## 2021-10-03 DIAGNOSIS — J189 Pneumonia, unspecified organism: Secondary | ICD-10-CM | POA: Diagnosis not present

## 2021-10-03 DIAGNOSIS — R059 Cough, unspecified: Secondary | ICD-10-CM | POA: Diagnosis not present

## 2021-10-03 DIAGNOSIS — R0602 Shortness of breath: Secondary | ICD-10-CM | POA: Diagnosis not present

## 2021-10-03 DIAGNOSIS — Z7984 Long term (current) use of oral hypoglycemic drugs: Secondary | ICD-10-CM

## 2021-10-03 DIAGNOSIS — Z833 Family history of diabetes mellitus: Secondary | ICD-10-CM

## 2021-10-03 DIAGNOSIS — Z7951 Long term (current) use of inhaled steroids: Secondary | ICD-10-CM

## 2021-10-03 DIAGNOSIS — S161XXD Strain of muscle, fascia and tendon at neck level, subsequent encounter: Secondary | ICD-10-CM

## 2021-10-03 DIAGNOSIS — Z791 Long term (current) use of non-steroidal anti-inflammatories (NSAID): Secondary | ICD-10-CM

## 2021-10-03 LAB — COMPREHENSIVE METABOLIC PANEL
ALT: 18 U/L (ref 0–44)
AST: 21 U/L (ref 15–41)
Albumin: 4.5 g/dL (ref 3.5–5.0)
Alkaline Phosphatase: 58 U/L (ref 38–126)
Anion gap: 16 — ABNORMAL HIGH (ref 5–15)
BUN: 23 mg/dL — ABNORMAL HIGH (ref 6–20)
CO2: 33 mmol/L — ABNORMAL HIGH (ref 22–32)
Calcium: 11.5 mg/dL — ABNORMAL HIGH (ref 8.9–10.3)
Chloride: 91 mmol/L — ABNORMAL LOW (ref 98–111)
Creatinine, Ser: 1.32 mg/dL — ABNORMAL HIGH (ref 0.61–1.24)
GFR, Estimated: >60 mL/min (ref >60)
Glucose, Bld: 121 mg/dL — ABNORMAL HIGH (ref 70–99)
Potassium: 4.2 mmol/L (ref 3.5–5.1)
Sodium: 140 mmol/L (ref 135–145)
Total Bilirubin: 1.1 mg/dL (ref 0.3–1.2)
Total Protein: 6.9 g/dL (ref 6.5–8.1)

## 2021-10-03 LAB — CBC WITH DIFFERENTIAL/PLATELET
Abs Immature Granulocytes: 0.17 10*3/uL — ABNORMAL HIGH (ref 0.00–0.07)
Basophils Absolute: 0.1 10*3/uL (ref 0.0–0.1)
Basophils Relative: 1 %
Eosinophils Absolute: 0.2 10*3/uL (ref 0.0–0.5)
Eosinophils Relative: 2 %
HCT: 46.3 % (ref 39.0–52.0)
Hemoglobin: 14.6 g/dL (ref 13.0–17.0)
Immature Granulocytes: 1 %
Lymphocytes Relative: 29 %
Lymphs Abs: 4.2 10*3/uL — ABNORMAL HIGH (ref 0.7–4.0)
MCH: 29.1 pg (ref 26.0–34.0)
MCHC: 31.5 g/dL (ref 30.0–36.0)
MCV: 92.4 fL (ref 80.0–100.0)
Monocytes Absolute: 1.7 10*3/uL — ABNORMAL HIGH (ref 0.1–1.0)
Monocytes Relative: 11 %
Neutro Abs: 8.2 10*3/uL — ABNORMAL HIGH (ref 1.7–7.7)
Neutrophils Relative %: 56 %
Platelets: 355 10*3/uL (ref 150–400)
RBC: 5.01 MIL/uL (ref 4.22–5.81)
RDW: 16.6 % — ABNORMAL HIGH (ref 11.5–15.5)
WBC: 14.6 10*3/uL — ABNORMAL HIGH (ref 4.0–10.5)
nRBC: 0.1 % (ref 0.0–0.2)

## 2021-10-03 LAB — COMPLETE METABOLIC PANEL WITH GFR
AG Ratio: 1.7 (calc) (ref 1.0–2.5)
ALT: 15 U/L (ref 9–46)
AST: 17 U/L (ref 10–35)
Albumin: 3.9 g/dL (ref 3.6–5.1)
Alkaline phosphatase (APISO): 62 U/L (ref 35–144)
BUN: 20 mg/dL (ref 7–25)
CO2: 32 mmol/L (ref 20–32)
Calcium: 10.8 mg/dL — ABNORMAL HIGH (ref 8.6–10.3)
Chloride: 97 mmol/L — ABNORMAL LOW (ref 98–110)
Creat: 1.08 mg/dL (ref 0.70–1.35)
Globulin: 2.3 g/dL (calc) (ref 1.9–3.7)
Glucose, Bld: 138 mg/dL — ABNORMAL HIGH (ref 65–99)
Potassium: 4.1 mmol/L (ref 3.5–5.3)
Sodium: 143 mmol/L (ref 135–146)
Total Bilirubin: 0.8 mg/dL (ref 0.2–1.2)
Total Protein: 6.2 g/dL (ref 6.1–8.1)
eGFR: 79 mL/min/{1.73_m2} (ref >=60)

## 2021-10-03 LAB — MAGNESIUM: Magnesium: 1.8 mg/dL (ref 1.5–2.5)

## 2021-10-03 LAB — TROPONIN I (HIGH SENSITIVITY): Troponin I (High Sensitivity): 9 ng/L (ref <18)

## 2021-10-03 LAB — BRAIN NATRIURETIC PEPTIDE: B Natriuretic Peptide: 33.2 pg/mL (ref 0.0–100.0)

## 2021-10-03 MED ORDER — METHYLPREDNISOLONE SODIUM SUCC 125 MG IJ SOLR
125.0000 mg | Freq: Every day | INTRAMUSCULAR | Status: DC
  Administered 2021-10-03: 125 mg via INTRAVENOUS
  Filled 2021-10-03: qty 2

## 2021-10-03 MED ORDER — ALBUTEROL SULFATE (2.5 MG/3ML) 0.083% IN NEBU
INHALATION_SOLUTION | RESPIRATORY_TRACT | Status: AC
  Administered 2021-10-03: 2.5 mg via RESPIRATORY_TRACT
  Filled 2021-10-03: qty 3

## 2021-10-03 MED ORDER — MAGNESIUM SULFATE 2 GM/50ML IV SOLN
2.0000 g | Freq: Once | INTRAVENOUS | Status: AC
  Administered 2021-10-03: 2 g via INTRAVENOUS
  Filled 2021-10-03: qty 50

## 2021-10-03 MED ORDER — SODIUM CHLORIDE 0.9 % IV SOLN
1.0000 g | Freq: Once | INTRAVENOUS | Status: AC
  Administered 2021-10-03: 1 g via INTRAVENOUS
  Filled 2021-10-03: qty 10

## 2021-10-03 MED ORDER — SODIUM CHLORIDE 0.9 % IV SOLN
500.0000 mg | Freq: Once | INTRAVENOUS | Status: AC
Start: 2021-10-03 — End: 2021-10-04
  Administered 2021-10-04: 500 mg via INTRAVENOUS
  Filled 2021-10-03: qty 5

## 2021-10-03 MED ORDER — IPRATROPIUM-ALBUTEROL 0.5-2.5 (3) MG/3ML IN SOLN
3.0000 mL | Freq: Once | RESPIRATORY_TRACT | Status: AC
  Administered 2021-10-03: 3 mL via RESPIRATORY_TRACT
  Filled 2021-10-03: qty 3

## 2021-10-03 MED ORDER — ALBUTEROL SULFATE (2.5 MG/3ML) 0.083% IN NEBU: 2.5000 mg | INHALATION_SOLUTION | Freq: Once | RESPIRATORY_TRACT | Status: AC

## 2021-10-03 NOTE — Plan of Care (Signed)
TRH will assume care on arrival to accepting facility. Until arrival, care as per EDP. However, TRH available 24/7 for questions and assistance.  Nursing staff, please page TRH Admits and Consults ([phone]) as soon as the patient arrives to the hospital.   

## 2021-10-03 NOTE — ED Provider Notes (Signed)
?[facility] EMERGENCY DEPT ?Provider Note ? ? ?CSN: [id number] ?Arrival date & time: 10/03/21  2055 ? ?  ? ?History ? ?Chief Complaint  ?Patient presents with  ? Shortness of Breath  ? ? ?Mark Harrell. is a 61 y.o. male. ? ?The history is provided by the patient.  ?Shortness of Breath ?Severity:  Severe ?Onset quality:  Sudden ?Timing:  Constant ?Progression:  Worsening ?Chronicity:  Recurrent ?Context: URI   ?Relieved by:  Nothing ?Worsened by:  Exertion ?Associated symptoms: cough   ?Associated symptoms: no abdominal pain, no chest pain, no claudication, no diaphoresis, no ear pain, no fever, no headaches, no hemoptysis, no neck pain, no PND, no rash, no sore throat, no sputum production, no syncope, no swollen glands, no vomiting and no wheezing   ? ?  ? ?Home Medications ?Prior to Admission medications   ?Medication Sig Start Date End Date Taking? Authorizing Provider  ?acetaminophen (TYLENOL) 500 MG tablet Take 1,000 mg by mouth 3 (three) times daily.    [provider]  ?anastrozole (ARIMIDEX) 1 MG tablet Take 1 mg by mouth daily. 12/07/19   [provider]  ?apixaban (ELIQUIS) 5 MG TABS tablet 10 mg bid x 5 days then 5 mg bid 09/27/21   Kristopher Oppenheim, DO  ?ascorbic acid (VITAMIN C) 1000 MG tablet Take 1,000 mg by mouth every evening.    [provider]  ?Azelastine HCl 137 MCG/SPRAY SOLN Place 2 sprays into both nostrils 2 (two) times daily. USE 1 TO 2 SPRAYS EACH NOSTRIL 2 X /DAY 04/11/21   [name], MD  ?benzonatate (TESSALON) 200 MG capsule Take 1 capsule 3 x / day to Prevent Cough                                       /                           TAKE                              BY                 MOUTH ?Patient taking differently: Take 200 mg by mouth 3 (three) times daily. 08/22/21   [name], MD  ?bisoprolol-hydrochlorothiazide (ZIAC) 5-6.25 MG tablet TAKE 1 TABLET BY MOUTH EVERY DAY FOR BLOOD PRESSURE ?Patient taking differently: Take 1 tablet  by mouth daily. 06/19/21   [name], MD  ?Botulinum Toxin Type A 200 units SOLR Inject 200 Units into the skin every 3 (three) months. 09/07/14   [provider]  ?budesonide (PULMICORT) 0.5 MG/2ML nebulizer solution Take 2 mLs (0.5 mg total) by nebulization 3 (three) times daily as needed. ?Patient taking differently: Take 0.5 mg by nebulization 2 (two) times daily. Morning and bedtime 07/16/21   [name], MD  ?celecoxib (CELEBREX) 200 MG capsule TAKE 1 CAPSULE (200 MG TOTAL) BY MOUTH EVERY 12 (TWELVE) HOURS. 09/23/21   Jessy Oto, MD  ?cetaphil (CETAPHIL) lotion Apply topically 2 (two) times daily. ?Patient taking differently: Apply 1 application. topically 2 (two) times daily. 06/09/16   Lavina Hamman, MD  ?cetirizine (ZYRTEC) 10 MG tablet Take 1 tablet (10 mg total) by mouth 2 (two) times daily as needed for allergies (Can take an extra dose  during flares). 04/11/21   [name], MD  ?Cholecalciferol (VITAMIN D3) 125 MCG (5000 UT) TABS Take 10,000 Units by mouth 2 (two) times daily. Morning and evening    [provider]  ?Cinnamon 500 MG capsule Take 1,000 mg by mouth 3 (three) times daily.    [provider]  ?clotrimazole-betamethasone (LOTRISONE) cream APPLY TO AFFECTED AREA TWICE A DAY ?Patient taking differently: Apply 1 application. topically 2 (two) times daily as needed (rash). 05/27/17   Vladimir Crofts, PA-C  ?Coenzyme Q10 (CO Q 10 PO) Take 600 mg by mouth daily.    [provider]  ?cyclobenzaprine (FLEXERIL) 10 MG tablet TAKE 1/2 TO 1 TABLET 3 X DAY ONLY IF NEED FOR MUSCLE SPASM (NECK) 10/03/21   [name], MD  ?diclofenac Sodium (VOLTAREN) 1 % GEL APPLY 2 TO 4 GRAMS TOPICALLY 2 TO 4 TIMES DAILY FOR PAIN & INFLAMMATION( OTC NOT COVERED) ?Patient taking differently: 2 g See admin instructions. APPLY 2 TO 4 GRAMS TOPICALLY 2 TO 4 TIMES DAILY FOR PAIN & INFLAMMATION( OTC NOT COVERED) 05/28/21   Liane Comber, NP  ?donepezil  (ARICEPT) 23 MG TABS tablet Take 23 mg by mouth daily.    [provider]  ?dupilumab (DUPIXENT) 300 MG/2ML prefilled syringe Inject 300 mg into the skin every 14 (fourteen) days. 03/28/21   [name], MD  ?EPINEPHrine 0.3 mg/0.3 mL IJ SOAJ injection Inject 0.3 mg into the muscle as needed. 04/11/21   [name], MD  ?fluticasone [facility]) 50 MCG/ACT nasal spray Place 2 sprays into both nostrils daily. Use 1 to 2 sprays each Nostril 2 x /day ?Patient taking differently: Place 2 sprays into both nostrils in the morning and at bedtime. 04/11/21   [name], MD  ?Fluticasone-Umeclidin-Vilant (TRELEGY ELLIPTA) 100-62.5-25 MCG/ACT AEPB Inhale 1 puff into the lungs daily. 04/11/21   [name], MD  ?furosemide (LASIX) 40 MG tablet Take 1.5 tablets (60 mg total) by mouth 2 (two) times daily. 09/27/21 10/27/21  Kristopher Oppenheim, DO  ?Guaifenesin 1200 MG TB12 Take 1,200 mg by mouth 3 (three) times daily.    [provider]  ?ipratropium-albuterol (DUONEB) 0.5-2.5 (3) MG/3ML SOLN TAKE 3 MLS BY NEBULIZATION EVERY 4 (FOUR) HOURS AS NEEDED. ?Patient taking differently: Take 3 mLs by nebulization every 4 (four) hours as needed (for shortess of breath). 09/24/21   [name], MD  ?Javier Docker Oil 500 MG CAPS Take 500 mg by mouth 2 (two) times daily.    [provider]  ?Magnesium 500 MG CAPS Take 500 mg by mouth 3 (three) times daily.    [provider]  ?memantine (NAMENDA) 10 MG tablet Take 10 mg by mouth 2 (two) times daily.    [provider]  ?metFORMIN (GLUCOPHAGE-XR) 500 MG 24 hr tablet Takes 2 tablets  2 x /day  with Meals  for Diabetes ?Patient taking differently: Take 1,000 mg by mouth 2 (two) times daily. for Diabetes 03/31/21   [name], MD  ?Misc Natural Products (GLUCOS-CHONDROIT-MSM COMPLEX) TABS Take 2 tablets by mouth 3 (three) times daily.    [provider]  ?montelukast (SINGULAIR) 10 MG tablet TAKE 1 TABLET BY  MOUTH DAILY FOR ALLERGIES ?Patient taking differently: Take 10 mg by mouth at bedtime. FOR ALLERGIES 04/11/21   [name], MD  ?Multiple Minerals-Vitamins (CITRACAL MAXIMUM PLUS) TABS Take 1 tablet by mouth 2 (two) times daily. Vitamin D    [provider]  ?Multiple Vitamins-Minerals (MULTIVITAMIN WITH  MINERALS) tablet Take 1 tablet by mouth daily.    [provider]  ?Multiple Vitamins-Minerals (PRESERVISION AREDS 2) CAPS Take 1 capsule by mouth 2 (two) times daily. Evening and bedtime    [provider]  ?ondansetron (ZOFRAN-ODT) 4 MG disintegrating tablet TAKE 1 TABLET (4 MG TOTAL) BY MOUTH EVERY 6 (SIX) HOURS AS NEEDED FOR NAUSEA OR VOMITING. 03/20/17   [name], MD  ?oxymetazoline (AFRIN) 0.05 % nasal spray Place 1 spray into both nostrils at bedtime.    [provider]  ?pentosan polysulfate (ELMIRON) 100 MG capsule Take 100 mg by mouth 3 (three) times daily.    [provider]  ?potassium chloride SA (KLOR-CON M) 20 MEQ tablet Take 2 tablets (40 mEq total) by mouth 2 (two) times daily. 09/27/21 10/27/21  Kristopher Oppenheim, DO  ?pravastatin (PRAVACHOL) 40 MG tablet TAKE 1 TABLET BY MOUTH AT BEDTIME FOR CHOLESTEROL ?Patient taking differently: Take 40 mg by mouth daily. FOR CHOLESTEROL 05/01/21   Magda Bernheim, NP  ?pregabalin (LYRICA) 150 MG capsule Take  1 capsule  3 x /day for Chronic Pain                                          /                          TAKE                              BY               MOUTH ?Patient taking differently: Take 150 mg by mouth 2 (two) times daily. 09/23/21   [name], MD  ?PROBIOTIC PRODUCT PO Take 1 capsule by mouth 2 (two) times daily. VSL    [provider]  ?promethazine-dextromethorphan (PROMETHAZINE-DM) 6.25-15 MG/5ML syrup Take  1 teaspoon (5 ml)  every 4 hours  as needed  for Cough                                   /                              TAKE            BY                MOUTH ?Patient  taking differently: Take 5 mLs by mouth 4 (four) times daily as needed for cough. 08/22/21   [name], MD  ?sertraline (ZOLOFT) 100 MG tablet Take 100 mg by mouth 2 (two) times daily.    Provider, [facility]

## 2021-10-03 NOTE — ED Triage Notes (Signed)
Pt BIB EMS from home with c/o [facility] and worsening cough. Pt wears 2lpm chronically. Pt has hx of copd.  ?

## 2021-10-03 NOTE — ED Notes (Signed)
RT assessed pt upon arrival to ED. Pt tripodding, coughing perfusely, hx of asthma-COPD overlap syndrome per chart pt also states he was told he has tracheomalacia, and that it feels like it is collapsing when he coughs. RT gave pt breathing treatments and placed him on Simple Mask 6 Lpm. Pt educated on the importance of expectorating any secretion that exit his airway to maintain patent airway and good ventilation. Pt verbalizes understanding. Pt respiratory status is stable w/minimal distress noted at this time. RT will continue to monitor.  ?

## 2021-10-04 ENCOUNTER — Inpatient Hospital Stay (HOSPITAL_COMMUNITY): Payer: BC Managed Care – PPO

## 2021-10-04 ENCOUNTER — Encounter (HOSPITAL_COMMUNITY): Payer: Self-pay | Admitting: Internal Medicine

## 2021-10-04 DIAGNOSIS — J398 Other specified diseases of upper respiratory tract: Secondary | ICD-10-CM | POA: Diagnosis present

## 2021-10-04 DIAGNOSIS — Z6841 Body Mass Index (BMI) 40.0 and over, adult: Secondary | ICD-10-CM | POA: Diagnosis not present

## 2021-10-04 DIAGNOSIS — Z20822 Contact with and (suspected) exposure to covid-19: Secondary | ICD-10-CM | POA: Diagnosis present

## 2021-10-04 DIAGNOSIS — I110 Hypertensive heart disease with heart failure: Secondary | ICD-10-CM | POA: Diagnosis present

## 2021-10-04 DIAGNOSIS — J9621 Acute and chronic respiratory failure with hypoxia: Secondary | ICD-10-CM

## 2021-10-04 DIAGNOSIS — J189 Pneumonia, unspecified organism: Secondary | ICD-10-CM

## 2021-10-04 DIAGNOSIS — J440 Chronic obstructive pulmonary disease with acute lower respiratory infection: Secondary | ICD-10-CM | POA: Diagnosis present

## 2021-10-04 DIAGNOSIS — J45901 Unspecified asthma with (acute) exacerbation: Secondary | ICD-10-CM | POA: Diagnosis present

## 2021-10-04 DIAGNOSIS — Z888 Allergy status to other drugs, medicaments and biological substances status: Secondary | ICD-10-CM | POA: Diagnosis not present

## 2021-10-04 DIAGNOSIS — J449 Chronic obstructive pulmonary disease, unspecified: Secondary | ICD-10-CM

## 2021-10-04 DIAGNOSIS — I82461 Acute embolism and thrombosis of right calf muscular vein: Secondary | ICD-10-CM | POA: Diagnosis not present

## 2021-10-04 DIAGNOSIS — R7303 Prediabetes: Secondary | ICD-10-CM

## 2021-10-04 DIAGNOSIS — Z881 Allergy status to other antibiotic agents status: Secondary | ICD-10-CM | POA: Diagnosis not present

## 2021-10-04 DIAGNOSIS — J441 Chronic obstructive pulmonary disease with (acute) exacerbation: Secondary | ICD-10-CM | POA: Diagnosis present

## 2021-10-04 DIAGNOSIS — Z91030 Bee allergy status: Secondary | ICD-10-CM | POA: Diagnosis not present

## 2021-10-04 DIAGNOSIS — K219 Gastro-esophageal reflux disease without esophagitis: Secondary | ICD-10-CM | POA: Diagnosis present

## 2021-10-04 DIAGNOSIS — E662 Morbid (severe) obesity with alveolar hypoventilation: Secondary | ICD-10-CM | POA: Diagnosis present

## 2021-10-04 DIAGNOSIS — N179 Acute kidney failure, unspecified: Secondary | ICD-10-CM

## 2021-10-04 DIAGNOSIS — Z86718 Personal history of other venous thrombosis and embolism: Secondary | ICD-10-CM | POA: Diagnosis not present

## 2021-10-04 DIAGNOSIS — I5032 Chronic diastolic (congestive) heart failure: Secondary | ICD-10-CM | POA: Diagnosis present

## 2021-10-04 DIAGNOSIS — N400 Enlarged prostate without lower urinary tract symptoms: Secondary | ICD-10-CM | POA: Diagnosis present

## 2021-10-04 DIAGNOSIS — M351 Other overlap syndromes: Secondary | ICD-10-CM | POA: Diagnosis present

## 2021-10-04 DIAGNOSIS — E785 Hyperlipidemia, unspecified: Secondary | ICD-10-CM | POA: Diagnosis present

## 2021-10-04 DIAGNOSIS — F32A Depression, unspecified: Secondary | ICD-10-CM | POA: Diagnosis present

## 2021-10-04 DIAGNOSIS — Z8616 Personal history of COVID-19: Secondary | ICD-10-CM | POA: Diagnosis not present

## 2021-10-04 DIAGNOSIS — Z8701 Personal history of pneumonia (recurrent): Secondary | ICD-10-CM | POA: Diagnosis not present

## 2021-10-04 DIAGNOSIS — I10 Essential (primary) hypertension: Secondary | ICD-10-CM

## 2021-10-04 DIAGNOSIS — J9601 Acute respiratory failure with hypoxia: Secondary | ICD-10-CM | POA: Diagnosis present

## 2021-10-04 LAB — PROCALCITONIN: Procalcitonin: <0.1 ng/mL

## 2021-10-04 LAB — BASIC METABOLIC PANEL
Anion gap: 18 — ABNORMAL HIGH (ref 5–15)
BUN: 27 mg/dL — ABNORMAL HIGH (ref 6–20)
CO2: 30 mmol/L (ref 22–32)
Calcium: 11.1 mg/dL — ABNORMAL HIGH (ref 8.9–10.3)
Chloride: 91 mmol/L — ABNORMAL LOW (ref 98–111)
Creatinine, Ser: 1.49 mg/dL — ABNORMAL HIGH (ref 0.61–1.24)
GFR, Estimated: 53 mL/min — ABNORMAL LOW (ref >60)
Glucose, Bld: 178 mg/dL — ABNORMAL HIGH (ref 70–99)
Potassium: 4.4 mmol/L (ref 3.5–5.1)
Sodium: 139 mmol/L (ref 135–145)

## 2021-10-04 LAB — TROPONIN I (HIGH SENSITIVITY): Troponin I (High Sensitivity): 10 ng/L (ref <18)

## 2021-10-04 LAB — CBC
HCT: 45 % (ref 39.0–52.0)
Hemoglobin: 14.8 g/dL (ref 13.0–17.0)
MCH: 30.3 pg (ref 26.0–34.0)
MCHC: 32.9 g/dL (ref 30.0–36.0)
MCV: 92 fL (ref 80.0–100.0)
Platelets: 311 10*3/uL (ref 150–400)
RBC: 4.89 MIL/uL (ref 4.22–5.81)
RDW: 16 % — ABNORMAL HIGH (ref 11.5–15.5)
WBC: 11.7 10*3/uL — ABNORMAL HIGH (ref 4.0–10.5)
nRBC: 0.2 % (ref 0.0–0.2)

## 2021-10-04 LAB — C-REACTIVE PROTEIN: CRP: 1.4 mg/dL — ABNORMAL HIGH (ref <1.0)

## 2021-10-04 LAB — HEMOGLOBIN A1C
Hgb A1c MFr Bld: 5.8 % — ABNORMAL HIGH (ref 4.8–5.6)
Mean Plasma Glucose: 119.76 mg/dL

## 2021-10-04 LAB — RESP PANEL BY RT-PCR (FLU A&B, COVID) ARPGX2
Influenza A by PCR: NEGATIVE
Influenza B by PCR: NEGATIVE
SARS Coronavirus 2 by RT PCR: NEGATIVE

## 2021-10-04 LAB — HIV ANTIBODY (ROUTINE TESTING W REFLEX): HIV Screen 4th Generation wRfx: NONREACTIVE

## 2021-10-04 LAB — GLUCOSE, CAPILLARY
Glucose-Capillary: 191 mg/dL — ABNORMAL HIGH (ref 70–99)
Glucose-Capillary: 163 mg/dL — ABNORMAL HIGH (ref 70–99)
Glucose-Capillary: 171 mg/dL — ABNORMAL HIGH (ref 70–99)
Glucose-Capillary: 200 mg/dL — ABNORMAL HIGH (ref 70–99)

## 2021-10-04 LAB — MRSA NEXT GEN BY PCR, NASAL: MRSA by PCR Next Gen: NOT DETECTED

## 2021-10-04 MED ORDER — ASCORBIC ACID 500 MG PO TABS
1000.0000 mg | ORAL_TABLET | Freq: Every evening | ORAL | Status: DC
  Administered 2021-10-04 – 2021-10-07 (×4): 1000 mg via ORAL
  Filled 2021-10-04 (×4): qty 2

## 2021-10-04 MED ORDER — METOPROLOL TARTRATE 25 MG PO TABS
25.0000 mg | ORAL_TABLET | Freq: Two times a day (BID) | ORAL | Status: DC
  Administered 2021-10-04 – 2021-10-08 (×8): 25 mg via ORAL
  Filled 2021-10-04 (×8): qty 1

## 2021-10-04 MED ORDER — DARIFENACIN HYDROBROMIDE ER 7.5 MG PO TB24
7.5000 mg | ORAL_TABLET | Freq: Every day | ORAL | Status: DC
Start: 2021-10-04 — End: 2021-10-05
  Filled 2021-10-04 (×2): qty 1

## 2021-10-04 MED ORDER — MOMETASONE FURO-FORMOTEROL FUM 200-5 MCG/ACT IN AERO
2.0000 | INHALATION_SPRAY | Freq: Two times a day (BID) | RESPIRATORY_TRACT | Status: DC
  Administered 2021-10-04 – 2021-10-08 (×9): 2 via RESPIRATORY_TRACT
  Filled 2021-10-04: qty 8.8

## 2021-10-04 MED ORDER — INSULIN ASPART 100 UNIT/ML IJ SOLN
0.0000 [IU] | Freq: Three times a day (TID) | INTRAMUSCULAR | Status: DC
  Administered 2021-10-04 – 2021-10-05 (×4): 3 [IU] via SUBCUTANEOUS
  Administered 2021-10-05: 2 [IU] via SUBCUTANEOUS
  Administered 2021-10-05: 3 [IU] via SUBCUTANEOUS
  Administered 2021-10-06: 1 [IU] via SUBCUTANEOUS
  Administered 2021-10-06: 2 [IU] via SUBCUTANEOUS
  Administered 2021-10-06 – 2021-10-07 (×3): 3 [IU] via SUBCUTANEOUS
  Administered 2021-10-08: 2 [IU] via SUBCUTANEOUS

## 2021-10-04 MED ORDER — DONEPEZIL HCL 23 MG PO TABS
23.0000 mg | ORAL_TABLET | Freq: Every day | ORAL | Status: DC
  Administered 2021-10-04 – 2021-10-08 (×5): 23 mg via ORAL
  Filled 2021-10-04 (×5): qty 1

## 2021-10-04 MED ORDER — UMECLIDINIUM BROMIDE 62.5 MCG/ACT IN AEPB
1.0000 | INHALATION_SPRAY | Freq: Every day | RESPIRATORY_TRACT | Status: DC
  Administered 2021-10-04 – 2021-10-08 (×5): 1 via RESPIRATORY_TRACT
  Filled 2021-10-04: qty 7

## 2021-10-04 MED ORDER — ORAL CARE MOUTH RINSE
15.0000 mL | Freq: Two times a day (BID) | OROMUCOSAL | Status: DC
  Administered 2021-10-04 – 2021-10-08 (×9): 15 mL via OROMUCOSAL

## 2021-10-04 MED ORDER — ASPIRIN EC 81 MG PO TBEC
81.0000 mg | DELAYED_RELEASE_TABLET | Freq: Every day | ORAL | Status: DC
  Administered 2021-10-04 – 2021-10-08 (×5): 81 mg via ORAL
  Filled 2021-10-04 (×5): qty 1

## 2021-10-04 MED ORDER — ENSURE ENLIVE PO LIQD
237.0000 mL | Freq: Two times a day (BID) | ORAL | Status: DC
  Administered 2021-10-04 – 2021-10-08 (×8): 237 mL via ORAL

## 2021-10-04 MED ORDER — PREGABALIN 75 MG PO CAPS
150.0000 mg | ORAL_CAPSULE | Freq: Two times a day (BID) | ORAL | Status: DC
  Administered 2021-10-04 – 2021-10-08 (×9): 150 mg via ORAL
  Filled 2021-10-04 (×9): qty 2

## 2021-10-04 MED ORDER — SERTRALINE HCL 100 MG PO TABS
100.0000 mg | ORAL_TABLET | Freq: Two times a day (BID) | ORAL | Status: DC
  Administered 2021-10-04 – 2021-10-08 (×9): 100 mg via ORAL
  Filled 2021-10-04 (×9): qty 1

## 2021-10-04 MED ORDER — PRAVASTATIN SODIUM 40 MG PO TABS
40.0000 mg | ORAL_TABLET | Freq: Every day | ORAL | Status: DC
  Administered 2021-10-04 – 2021-10-07 (×4): 40 mg via ORAL
  Filled 2021-10-04 (×4): qty 1

## 2021-10-04 MED ORDER — VITAMIN D 25 MCG (1000 UNIT) PO TABS
10000.0000 [IU] | ORAL_TABLET | Freq: Two times a day (BID) | ORAL | Status: DC
  Administered 2021-10-04 – 2021-10-08 (×9): 10000 [IU] via ORAL
  Filled 2021-10-04 (×10): qty 10

## 2021-10-04 MED ORDER — ALBUTEROL SULFATE (2.5 MG/3ML) 0.083% IN NEBU: 2.5000 mg | INHALATION_SOLUTION | RESPIRATORY_TRACT | Status: DC | PRN

## 2021-10-04 MED ORDER — BENZONATATE 100 MG PO CAPS
200.0000 mg | ORAL_CAPSULE | Freq: Three times a day (TID) | ORAL | Status: DC
  Administered 2021-10-04 – 2021-10-08 (×13): 200 mg via ORAL
  Filled 2021-10-04 (×13): qty 2

## 2021-10-04 MED ORDER — CHLORHEXIDINE GLUCONATE CLOTH 2 % EX PADS
6.0000 | MEDICATED_PAD | Freq: Every day | CUTANEOUS | Status: DC
  Administered 2021-10-04 – 2021-10-05 (×2): 6 via TOPICAL

## 2021-10-04 MED ORDER — MEMANTINE HCL 10 MG PO TABS
10.0000 mg | ORAL_TABLET | Freq: Two times a day (BID) | ORAL | Status: DC
  Administered 2021-10-04 – 2021-10-08 (×9): 10 mg via ORAL
  Filled 2021-10-04 (×10): qty 1

## 2021-10-04 MED ORDER — PREDNISONE 20 MG PO TABS
40.0000 mg | ORAL_TABLET | Freq: Every day | ORAL | Status: DC
  Administered 2021-10-04 – 2021-10-06 (×3): 40 mg via ORAL
  Filled 2021-10-04 (×3): qty 2

## 2021-10-04 MED ORDER — LACTATED RINGERS IV SOLN
INTRAVENOUS | Status: DC
Start: 2021-10-04 — End: 2021-10-05

## 2021-10-04 MED ORDER — APIXABAN 5 MG PO TABS
5.0000 mg | ORAL_TABLET | Freq: Two times a day (BID) | ORAL | Status: DC
  Administered 2021-10-04 – 2021-10-08 (×9): 5 mg via ORAL
  Filled 2021-10-04 (×9): qty 1

## 2021-10-04 NOTE — Assessment & Plan Note (Addendum)
With persistence of LLL infiltrate and elevation of WBC despite recent ABx treatment ?Interestingly pro-calcitonin was negative during last admit as well. ?1. Covid and flu is neg ?2. Got rocephin + azithro in the ED ?1. Failed ABx last admit ?2. Failed levaquin before last admit ?3. Failed ceftin before last admit ?3. CT during last admit showed: IMPRESSION: Moderate severity bilateral lower lobe airspace disease, mildly increased in severity when compared to the prior study. ?1. Prior study was April 11. ?4. ? Cryptogenic organizing pneumonia, postinflammatory pulm fibrosis, or other unusual pulmonary syndrome, especially given h/o "long COVID" and ongoing persistent hypoxia post COVID in Jan 2023. ?5. Given ongoing symptoms and persistent imaging findings.  I think this guy deserves a pulmonology consult at this point. ?1. Will put in for them to see him in AM ?2. Will also order the HRCT that they recd considering in April office note (wondering about scaring from COVID vs PIPF). ?6. WBC 14k ?1. Interestingly with monocytosis ?

## 2021-10-04 NOTE — TOC Initial Note (Addendum)
Transition of Care (TOC) - Initial/Assessment Note  ? ? ?Patient Details  ?Name: Mark Harrell. ?MRN: [id number] ?Date of Birth: 1961-04-06 ? ?Transition of Care (TOC) CM/SW Contact:    ?Coralee Pesa, LCSWA ?Phone Number: ?10/04/2021, 4:16 PM ? ?Clinical Narrative:                 ?CSW spoke with pt to discuss PT recommendation and discharge options. Pt is adamant he would like to go to SNF. He and PT note that he has declined since having covid and is now not able to ambulate in the community. CSW reviewed chart and advised pt that there is a possibility that SNF will not be approved as he is right on the line. He stated that he would consider private paying if it is not approved, since he feels that if he does not do something to get better, he will continue to decline rapidly. CSW also advised pt on [facility] options, though he is less interested in that.  ?Pt states he would like to remain in [place], [place]. Gov info given. Pt currently lives with his mother and uncle as he helps care for his mother. Though they have help, he advised that they still need to do a lot of things for her. Pt notes he will likely need amb transport at DC.  ?CSW did PASSR screening, it was sent to additional review. Documents uploaded to [facility] spoke with PT who noted pt could improve to [facility], and could likely be HH if SNF is not approved. Auth will need to be started by chosen facility. TOC will continue to follow for DC needs. ? ?Expected Discharge Plan: [place] ?Barriers to Discharge: Ship broker, Continued Medical Work up, SNF Pending bed offer ? ? ?Patient Goals and CMS Choice ?Patient states their goals for this hospitalization and ongoing recovery are:: Pt states he wants to get to SNF. ?CMS Medicare.gov Compare Post Acute Care list provided to:: Patient ?Choice offered to / list presented to : Patient ? ?Expected Discharge Plan and Services ?Expected Discharge Plan: [place] ?  ?   ?Post Acute Care Choice: [place] ?Living arrangements for the past 2 months: [place] ?                ?  ?  ?  ?  ?  ?  ?  ?  ?  ?  ? ?Prior Living Arrangements/Services ?Living arrangements for the past 2 months: [place] ?Lives with:: Self, Parents ?Patient language and need for interpreter reviewed:: Yes ?Do you feel safe going back to the place where you live?: Yes      ?Need for Family Participation in Patient Care: No (Comment) ?Care giver support system in place?: No (comment) ?Current home services: DME, Homehealth aide ?Criminal Activity/Legal Involvement Pertinent to Current Situation/Hospitalization: No - Comment as needed ? ?Activities of Daily Living ?Home Assistive Devices/Equipment: Kasandra Knudsen (specify quad or straight), Eyeglasses ?ADL Screening (condition at time of admission) ?Patient's cognitive ability adequate to safely complete daily activities?: Yes ?Is the patient deaf or have difficulty hearing?: No ?Does the patient have difficulty seeing, even when wearing glasses/contacts?: Yes ?Does the patient have difficulty concentrating, remembering, or making decisions?: No ?Patient able to express need for assistance with ADLs?: Yes ?Does the patient have difficulty dressing or bathing?: No ?Independently performs ADLs?: No ?Communication: Independent ?Dressing (OT): Needs assistance ?Is this a change from baseline?: Change from baseline, expected to  last >3 days ?Grooming: Needs assistance ?Is this a change from baseline?: Change from baseline, expected to last >3 days ?Feeding: Independent ?Bathing: Independent ?Toileting: Independent with device (comment) ?In/Out Bed: Independent with device (comment) ?Walks in Home: Independent with device (comment) ?Does the patient have difficulty walking or climbing stairs?: No ?Weakness of Legs: Both ?Weakness of Arms/Hands: None ? ?Permission Sought/Granted ?Permission sought to share information with : Family  Supports ?Permission granted to share information with : No ?   ?   ?   ?   ? ?Emotional Assessment ?Appearance:: Appears stated age ?Attitude/Demeanor/Rapport: Engaged ?Affect (typically observed): Appropriate ?Orientation: : Oriented to Self, Oriented to Place, Oriented to Situation, Oriented to  Time ?Alcohol / Substance Use: Not Applicable ?Psych Involvement: No (comment) ? ?Admission diagnosis:  Acute respiratory failure with hypoxia ([place]) [J96.01] ?Mild asthma with exacerbation, unspecified whether persistent [J45.901] ?Patient Active Problem List  ? Diagnosis Date Noted  ? Hypokalemia 09/26/2021  ? Dyspnea 09/25/2021  ? Acute deep vein thrombosis (DVT) of right lower extremity ([place]) 09/24/2021  ? OSA (obstructive sleep apnea) 09/24/2021  ? Herniation of cervical intervertebral disc with radiculopathy   ? Status post cervical discectomy 08/20/2020  ? Tracheomalacia 05/26/2019  ? Acquired tracheomalacia 05/26/2019  ? HNP (herniated nucleus pulposus) with myelopathy, cervical 05/23/2019  ?  Class: Chronic  ? Spinal stenosis of cervical region 05/23/2019  ?  Class: Chronic  ? Status post cervical spinal fusion 05/23/2019  ? Acute on chronic respiratory failure with hypoxia ([place]) 04/12/2018  ? Bilateral lower extremity edema 12/29/2017  ? Hymenoptera allergy 11/10/2017  ? Family history of colonic polyps 08/07/2017  ? Myofascial pain 08/06/2017  ? Seasonal and perennial allergic rhinitis 04/23/2017  ? 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/2017  ? Cervicalgia 04/01/2017  ? Left lower lobe pneumonia 03/09/2017  ? Other spondylosis with radiculopathy, cervical region 02/09/2017  ? Depression with anxiety 01/04/2017  ? Memory difficulty 12/05/2016  ? Morbid obesity ([place]) 12/05/2016  ? Migraine without aura and without status migrainosus, not intractable 10/30/2016  ? Lumbar radiculopathy 05/15/2016  ? Osteoarthritis of spine with radiculopathy, lumbar region 05/15/2016  ? Risk for falls 05/15/2016  ? Recurrent infections  03/29/2016  ? Chronic nonseasonal allergic rhinitis due to pollen 03/29/2016  ? Polypharmacy 01/16/2016  ? Morbid obesity with BMI of 45.0-49.9, adult ([place]) 03/06/2015  ? SDAT 02/05/2015  ? OSA and COPD overlap syndrome ([place]) 02/05/2015  ? Medication management 08/02/2014  ? GERD (gastroesophageal reflux disease) 05/09/2014  ? Vitamin D deficiency 08/01/2013  ? Prediabetes 08/01/2013  ? Positive TB test 07/29/2011  ? Diverticula of colon 05/07/2011  ? Hypertension 01/31/2011  ? Hyperlipidemia, mixed 01/31/2011  ? BPH (benign prostatic hyperplasia) 01/31/2011  ? Testosterone Deficiency 01/31/2011  ? IBS (irritable bowel syndrome) 01/31/2011  ? Partial complex seizure disorder with intractable epilepsy ([place]) 01/31/2011  ? Depression, major, recurrent, in partial remission ([place]) 01/31/2011  ? Asthma-COPD overlap syndrome ([place]) 01/31/2011  ? ?PCP:  [name], MD ?Pharmacy:   ?[id number] [place], [place] - [address]?Phone: [phone]: [phone]? ?[facility] (Passenger transport manager [place]?[address] ?[id number]?Phone: [phone]: [phone]? ? ? ? ?Social Determinants of Health (SDOH) Interventions ?  ? ?Readmission Risk Interventions ?   ? View : No data to display.  ?  ?  ?  ? ? ? ?

## 2021-10-04 NOTE — Evaluation (Signed)
Physical Therapy Evaluation ?Patient Details ?Name: Mark Harrell. ?MRN: [id number] ?DOB: 1961/02/03 ?Today's Date: 10/04/2021 ? ?History of Present Illness ? Patient is a 61 yo male presenting to the ED with SOB and cough on 10/03/21. Admitted with pneumonia on 10/03/21. Patient with recent admission to ED with SOB on 09/24/21. PMH includes:  morbid obesity, HTN, pre-diabetes, COPD/ Asthma / OSA overlap syndrome. COVID+ ?  ?Clinical Impression ? Pt presents with condition above and deficits mentioned below, see PT Problem List. 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 The mother also has aides from 9 am - 9 pm to assist her physically as pt cannot and has not been able to physically assist her. He lives with her in a 2-level house (stays on main level) with 3 STE. Pt reports his mobility has progressively worsened since he got COVID in January 2023, from being able to ambulate community distances mod I with SPC (no AD in the home) to only now being able to tolerate ambulating the short distance to the bathroom with a SPC (sometimes scooting on seat of rollator instead). Pt does display deficits in activity tolerance/endurance, balance, and gross strength that place him at risk for falls. He ambulated up to ~15 ft 2x today without UE support at a min guard assist level, displaying a trunk sway and fatigue. Pt is interested in and I believe he could benefit from short-term rehab at a SNF to improve his endurance, strength, and balance to improve his quality of life so he can return to going out in the community again. Will continue to follow acutely. ?   ? ?Recommendations for follow up therapy are one component of a multi-disciplinary discharge planning process, led by the attending physician.  Recommendations may be updated based on patient status, additional functional criteria and insurance authorization. ? ?Follow Up Recommendations Skilled nursing-short term rehab (<3  hours/day) ? ?  ?Assistance Recommended at Discharge Intermittent Supervision/Assistance  ?Patient can return home with the following ? A little help with walking and/or transfers;A little help with bathing/dressing/bathroom;Assistance with cooking/housework;Assist for transportation;Help with stairs or ramp for entrance ? ?  ?Equipment Recommendations Other (comment) (bariatric shower chair)  ?Recommendations for Other Services ?    ?  ?Functional Status Assessment Patient has had a recent decline in their functional status and demonstrates the ability to make significant improvements in function in a reasonable and predictable amount of time.  ? ?  ?Precautions / Restrictions Precautions ?Precautions: Fall;Other (comment) ?Precaution Comments: watch SpO2 (3-3.5L baseline) ?Restrictions ?Weight Bearing Restrictions: No  ? ?  ? ?Mobility ? Bed Mobility ?  ?  ?  ?  ?  ?  ?  ?General bed mobility comments: Pt up in recliner upon arrival. ?  ? ?Transfers ?Overall transfer level: Needs assistance ?Equipment used: None ?Transfers: Sit to/from Stand ?Sit to Stand: Supervision ?  ?  ?  ?  ?  ?General transfer comment: Supervision for safety from recliner 1x and commode 3x. ?  ? ?Ambulation/Gait ?Ambulation/Gait assistance: Min guard ?Gait Distance (Feet): 15 Feet (x2 bouts of ~15 ft) ?Assistive device: None ?Gait Pattern/deviations: Step-through pattern, Decreased stride length, Wide base of support ?Gait velocity: reduced ?Gait velocity interpretation: <1.31 ft/sec, indicative of household ambulator ?  ?General Gait Details: Pt with mild trunk sway and only tolerating ambulating short distances before fatigue, min guard for safety ? ?Stairs ?  ?  ?  ?  ?  ? ?Wheelchair  Mobility ?  ? ?Modified Rankin (Stroke Patients Only) ?  ? ?  ? ?Balance Overall balance assessment: Mild deficits observed, not formally tested ?  ?  ?  ?  ?  ?  ?  ?  ?  ?  ?  ?  ?  ?  ?  ?  ?  ?  ?   ? ? ? ?Pertinent Vitals/Pain Pain Assessment ?Pain  Assessment: Faces ?Faces Pain Scale: No hurt ?Pain Intervention(s): Monitored during session  ? ? ?Home Living Family/patient expects to be discharged to:: Private residence ?Living Arrangements: Parent (bed-bound mother) ?Available Help at Discharge: Family (Uncle and pt assist each other as able, but Uncle also is caregiver of pt's mother and has deficits of his own) ?Type of Home: House ?Home Access: Stairs to enter ?Entrance Stairs-Rails: None ?Entrance Stairs-Number of Steps: 2 + 1 ?  ?Home Layout: Two level;Able to live on main level with bedroom/bathroom (does not go upstairs) ?Home Equipment: Rollator (4 wheels);Cane - single Barista (2 wheels);Adaptive equipment ?Additional Comments: Pt sleeps in recliner. On 3-3.5L O2 at baseline. Aides take care of pt's mother 9 am - 9 pm.  ?  ?Prior Function Prior Level of Function : Independent/Modified Independent ?  ?  ?  ?  ?  ?  ?Mobility Comments: Before this January when he got COVID he was able to ambulate long distances without much difficulty. Only used cane in community. In the past few weeks he began to use the cane in the home also and intermittently ambulated without the cane short distances in the home, mod I. Has been having increased difficulty with breathing and thus endurance since getting COVID. Progressive decline in distance he is able to ambulate since getting COVID. Was ambulating in community prior to [place], was able to walk around home ~1 month ago, now only goes the distance to the bathroom, sometimes scooting there on the rollator seat. 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 Pt manages finances and chores for mother, but cannot assist her physically due to his own deficits. Increased difficulty with ADLs due to endurance deficits, thereby began sponge bathing instead of showering. Uses AE for toileting ?  ? ? ?Hand Dominance  ?   ? ?  ?Extremity/Trunk Assessment  ? Upper Extremity  Assessment ?Upper Extremity Assessment: Defer to OT evaluation ?  ? ?Lower Extremity Assessment ?Lower Extremity Assessment: Generalized weakness ?  ? ?Cervical / Trunk Assessment ?Cervical / Trunk Assessment: Other exceptions ?Cervical / Trunk Exceptions: increased body habitus  ?Communication  ? Communication: No difficulties  ?Cognition Arousal/Alertness: Awake/alert ?Behavior During Therapy: Impulsive (mildly) ?Overall Cognitive Status: Within Functional Limits for tasks assessed ?  ?  ?  ?  ?  ?  ?  ?  ?  ?  ?  ?  ?  ?  ?  ?  ?General Comments: Pt is likely at his baseline, displaying some mild impulsive tendencies like quickly moving prior to lines being managed and needing cues to wait to untangle to avoid pulling the lines (likely distracted by SOB and mind set on target to get to chair or toilet) ?  ?  ? ?  ?General Comments General comments (skin integrity, edema, etc.): Pt in rush to use restroom and had to unhook leads to safely get him there in time, thus no vital readings noted during this session; on 4L O2 throughout ? ?  ?Exercises    ? ?Assessment/Plan  ?  ?  PT Assessment Patient needs continued PT services  ?PT Problem List Decreased strength;Decreased activity tolerance;Decreased balance;Decreased mobility;Cardiopulmonary status limiting activity;Obesity ? ?   ?  ?PT Treatment Interventions DME instruction;Gait training;Stair training;Functional mobility training;Therapeutic activities;Therapeutic exercise;Balance training;Neuromuscular re-education;Patient/family education   ? ?PT Goals (Current goals can be found in the Care Plan section)  ?Acute Rehab PT Goals ?Patient Stated Goal: to go get rehab at a facility before going home ?PT Goal Formulation: With patient ?Time For Goal Achievement: 10/18/21 ?Potential to Achieve Goals: Good ? ?  ?Frequency Min 2X/week ?  ? ? ?Co-evaluation   ?  ?  ?  ?  ? ? ?  ?AM-PAC PT "6 Clicks" Mobility  ?Outcome Measure Help needed turning from your back to your  side while in a flat bed without using bedrails?: A Little ?Help needed moving from lying on your back to sitting on the side of a flat bed without using bedrails?: A Little ?Help needed moving to and from a bed to a chair

## 2021-10-04 NOTE — Progress Notes (Signed)
?                                  PROGRESS NOTE                                             ?                                                                                                                     ?                                         ? ? Patient Demographics:  ? ? Mark Harrell, is a 61 y.o. male, DOB - 01/25/1961, DXA:[id number] ? ?Outpatient Primary MD for the patient is [name], MD    LOS - 0  Admit date - 10/03/2021   ? ?Chief Complaint  ?Patient presents with  ? Shortness of Breath  ?    ? ?Brief Narrative (HPI from H&P)  61 y.o. male with medical history significant of morbid obesity, HTN, pre-diabetes.  COPD/ Asthma / OSA overlap syndrome, chronic hypoxic respiratory failure on 2 L nasal cannula oxygen, acquired secondary tracheomalacia per patient.  He is on Dupixent for his Asthma.  Pt had COVID PNA in Jan 2023, has had SOB and dyspnea ever since then. Pt diagnosed with BLL PNA on CT scan in early April 2023.  Failed outpt Ceftin, also failed outpt Levaquin.  Admitted to hospital 5/2-5/5, got ABx.  Also found to have DVT though no PE at that time, started on eliquis.  He now presents back after having difficulty to breathe, had to bump up his oxygen to 4 L, was feeling that his throat was closing up and he was having a tough time to take a deep breath.  He was diagnosed with acute on chronic hypoxic failure requiring 4 L nasal cannula oxygen, left lower lobe pneumonia along with COPD exacerbation and admitted to the hospital for ?  ? ? Subjective:  ? ? Mark Harrell today has, No headache, No chest pain, No abdominal pain - No Nausea, No new weakness tingling or numbness, improved SOB. ? ? Assessment  & Plan :  ? ? ?Acute on chronic hypoxic respiratory failure patient baseline at 2 L nasal cannula oxygen now on 4, asthma/COPD exacerbation, left lower lobe pneumonia along with history of acquired tracheomalacia. ? ?He has been placed on combination of  steroids, antibiotics along with nebulizer treatments and 4 L nasal cannula oxygen which is up from 2 L which he uses at baseline, he is already feeling better, most of his symptoms were likely due to worsening of his tracheomalacia, currently no upper airway wheezing and he feels  about 80% better than his presenting symptoms.  Continue to monitor closely, if better discharge in the next 1 to 2 days with outpatient pulmonary follow-up. ? ? ?Acute deep vein thrombosis (DVT) of right lower extremity ([place]) - diagnosed recently on a previous hospital admission, continue with Eliquis. ?  ?Morbid obesity.  BMI of 51.  Follow with PCP for weight loss. ? ?OSA/OHS.  Does not wear CPAP at home, continue nighttime oxygen, sleeps in a recliner. ? ?AKI.  Obtain urine electrolytes, hydrate with IV fluids, renal ultrasound and monitor.   ? ?Prediabetes - Hold metformin, ISS as on steroids ? ?Lab Results  ?Component Value Date  ? HGBA1C 5.8 (H) 10/04/2021  ? ?CBG (last 3)  ?Recent Labs  ?  10/04/21 ?0616  ?GLUCAP 191*  ? ? ? ?   ? ?Condition -  Guarded ? ?Family Communication  : None present ? ?Code Status :  Full ? ?Consults  :  None ? ?PUD Prophylaxis :  ? ? Procedures  :    ? ? Renal US ? ?   ? ?Disposition Plan  :   ? ?Status is: Inpatient ? ?DVT Prophylaxis  :   ? ?Place TED hose Start: 10/04/21 0827 ?apixaban (ELIQUIS) tablet 5 mg  ?  ? ?Lab Results  ?Component Value Date  ? PLT 311 10/04/2021  ? ? ?Diet :  ?Diet Order   ? ?       ?  Diet Carb Modified Fluid consistency: Thin; Room service appropriate? Yes  Diet effective now       ?  ? ?  ?  ? ?  ?  ? ?Inpatient Medications ? ?Scheduled Meds: ? apixaban  5 mg Oral BID  ? ascorbic acid  1,000 mg Oral QPM  ? benzonatate  200 mg Oral TID  ? Chlorhexidine Gluconate Cloth  6 each Topical Daily  ? cholecalciferol  10,000 Units Oral BID  ? darifenacin  7.5 mg Oral Daily  ? donepezil  23 mg Oral Daily  ? insulin aspart  0-15 Units Subcutaneous TID WC  ? mouth rinse  15 mL Mouth  Rinse BID  ? memantine  10 mg Oral BID  ? mometasone-formoterol  2 puff Inhalation BID  ? pravastatin  40 mg Oral q1800  ? predniSONE  40 mg Oral Q breakfast  ? pregabalin  150 mg Oral BID  ? sertraline  100 mg Oral BID  ? umeclidinium bromide  1 puff Inhalation Daily  ? ?Continuous Infusions: ? lactated ringers    ? ?PRN Meds:.albuterol ? ?Time Spent in minutes  30 ? ? ?Lala Lund M.D on 10/04/2021 at 8:27 AM ? ?To page go to [email or web address]  ? ?[facility] -  Office  [phone] ? ?See all Orders from today for further details ? ? ? Objective:  ? ?Vitals:  ? 10/04/21 0152 10/04/21 6333 10/04/21 0732 10/04/21 0742  ?BP:  (!) 112/49  (!) 119/57  ?Pulse:    (!) 108  ?Resp:    20  ?Temp:    98.1 ?F (36.7 ?C)  ?TempSrc:    Oral  ?SpO2: 91%  91% 90%  ?Weight:      ?Height:      ? ? ?Wt Readings from Last 3 Encounters:  ?10/04/21 (!) 171.5 kg  ?09/27/21 (!) 174.2 kg  ?09/17/21 (!) 174.6 kg  ? ? ? ?Intake/Output Summary (Last 24 hours) at 10/04/2021 0827 ?Last data filed at 10/04/2021 0130 ?Gross per 24 hour  ?Intake 645.47  ml  ?Output --  ?Net 645.47 ml  ? ? ? ?Physical Exam ? ?Awake Alert, No new F.N deficits, Normal affect ?[place].AT,PERRAL ?Supple Neck, No JVD,   ?Symmetrical Chest wall movement, Good air movement bilaterally, CTAB ?RRR,No Gallops,Rubs or new Murmurs,  ?+ve B.Sounds, Abd Soft, No tenderness,   ?No Cyanosis, Clubbing or edema  ?  ? ? Data Review:  ? ? ?CBC ?Recent Labs  ?Lab 10/03/21 ?2112 10/04/21 ?7209  ?WBC 14.6* 11.7*  ?HGB 14.6 14.8  ?HCT 46.3 45.0  ?PLT 355 311  ?MCV 92.4 92.0  ?MCH 29.1 30.3  ?MCHC 31.5 32.9  ?RDW 16.6* 16.0*  ?LYMPHSABS 4.2*  --   ?MONOABS 1.7*  --   ?EOSABS 0.2  --   ?BASOSABS 0.1  --   ? ? ?Electrolytes ?Recent Labs  ?Lab 10/02/21 ?1508 10/03/21 ?2112 10/03/21 ?2202 10/04/21 ?4709  ?NA 143  --  140 139  ?K 4.1  --  4.2 4.4  ?CL 97*  --  91* 91*  ?CO2 32  --  33* 30  ?GLUCOSE 138*  --  121* 178*  ?BUN 20  --  23* 27*  ?CREATININE 1.08  --  1.32* 1.49*  ?CALCIUM 10.8*  --   11.5* 11.1*  ?AST 17  --  21  --   ?ALT 15  --  18  --   ?ALKPHOS  --   --  53  --   ?BILITOT 0.8  --  1.1  --   ?ALBUMIN  --   --  4.5  --   ?MG 1.8  --   --   --   ?HGBA1C  --   --   --  5.8*  ?BNP  --  33.2  --   --   ? ? ?------------------------------------------------------------------------------------------------------------------ ?No results for input(s): CHOL, HDL, LDLCALC, TRIG, CHOLHDL, LDLDIRECT in the last 72 hours. ? ?Lab Results  ?Component Value Date  ? HGBA1C 5.8 (H) 10/04/2021  ? ? ?No results for input(s): TSH, T4TOTAL, T3FREE, THYROIDAB in the last 72 hours. ? ?Invalid input(s): FREET3 ?------------------------------------------------------------------------------------------------------------------ ?ID Labs ?Recent Labs  ?Lab 10/02/21 ?1508 10/03/21 ?2112 10/03/21 ?2202 10/04/21 ?6283  ?WBC  --  14.6*  --  11.7*  ?PLT  --  355  --  311  ?CREATININE 1.08  --  1.32* 1.49*  ? ?Cardiac Enzymes ?No results for input(s): CKMB, TROPONINI, MYOGLOBIN in the last 168 hours. ? ?Invalid input(s): CK ? ? ?Radiology Reports ?DG Chest Port 1 View ? ?Result Date: 10/04/2021 ?CLINICAL DATA:  Shortness of breath. EXAM: PORTABLE CHEST 1 VIEW COMPARISON:  10/03/2021 FINDINGS: Stable cardiomediastinal contours. New bibasilar opacities which are favored to represent areas of atelectasis and or recurrent airspace disease. No signs of pleural effusion or edema. Visualized osseous structures appear intact. IMPRESSION: New bibasilar opacities which are favored to represent areas of atelectasis and/or recurrent airspace disease. Electronically Signed   By: Kerby Moors M.D.   On: 10/04/2021 07:05  ? ?DG Chest Portable 1 View ? ?Result Date: 10/03/2021 ?CLINICAL DATA:  Shortness of breath EXAM: PORTABLE CHEST 1 VIEW COMPARISON:  09/24/2021 FINDINGS: Cardiomegaly. Mild vascular congestion. Continued left retrocardiac opacity, improved since prior study. No focal opacity on the right. No effusions or acute bony abnormality.  IMPRESSION: Cardiomegaly, vascular congestion. Left lower lobe airspace disease persists but is improved since prior study. Electronically Signed   By: Rolm Baptise M.D.   On: 10/03/2021 22:08    ? ? ?

## 2021-10-04 NOTE — H&P (Signed)
?History and Physical  ? ? ?Patient: Kelyn Koskela. HQI:[id number] DOB: 1960/10/23 ?DOA: 10/03/2021 ?DOS: the patient was seen and examined on 10/04/2021 ?PCP: [name], MD  ?Patient coming from: Home ? ?Chief Complaint:  ?Chief Complaint  ?Patient presents with  ? Shortness of Breath  ? ?HPI: Mae Cianci. is a 61 y.o. male with medical history significant of morbid obesity, HTN, pre-diabetes.  COPD/ Asthma / OSA overlap syndrome.  He is on Dupixent for his Asthma. ? ?Pt had COVID PNA in Jan 2023, has had SOB and dyspnea ever since then. ? ?He is on 2L O2 at baseline now. ? ?Pt diagnosed with BLL PNA on CT scan in early April 2023.  Failed outpt Ceftin, also failed outpt Levaquin.  Admitted to hospital 5/2-5/5, got ABx.  Also found to have DVT though no PE at that time, started on eliquis. ? ?Pt now back in to ED with worsening respiratory symptoms, SOB, cough, wheezing.  Now needing 4L via [place] to maintain sats. ?  ?Review of Systems: As mentioned in the history of present illness. All other systems reviewed and are negative. ?Past Medical History:  ?Diagnosis Date  ? Anesthesia complication requiring reversal agent administration   ? ? from central apnea, very difficult to get off vent  ? Anxiety   ? Arthritis   ? osteo  ? Asthma   ? BPH (benign prostatic hyperplasia)   ? Complication of anesthesia   ? difficulty waking , they twlight me because of my respiratory problems "  ? Depression   ? Dyspnea   ? on exertion  ? Enlarged heart   ? Family history of adverse reaction to anesthesia   ? mother trouble waking up, and heart stopped  ? GERD (gastroesophageal reflux disease)   ? Headache   ? botox injections for headaches  ? Hyperlipidemia   ? Hypertension   ? Hypogonadism male   ? IBS (irritable bowel syndrome)   ? Memory difficulties   ? short term memories  ? Neuropathy   ? Obesity   ? On home oxygen therapy   ? on 2 liter  ? OSA (obstructive sleep apnea)   ? not using cpap  ? Paralysis ([place])   ? left  hand - small  ? Pneumonia   ? Pre-diabetes   ? Prostatitis   ? ?Past Surgical History:  ?Procedure Laterality Date  ? ABDOMINAL SURGERY    ? ANKLE FRACTURE SURGERY Right   ? ANTERIOR CERVICAL DECOMP/DISCECTOMY FUSION N/A 05/23/2019  ? Procedure: ANTERIOR CERVICAL DISCECTOMY FUSION CERVICAL FIVE THROUGH CERVICAL SIX AND CERVICAL SIX THROUGH CERVICAL SEVEN;  Surgeon: Jessy Oto, MD;  Location: [facility];  Service: Orthopedics;  Laterality: N/A;  ? COLONOSCOPY    ? CYSTOSCOPY    ? [place]  ? KNEE ARTHROSCOPY WITH MEDIAL MENISECTOMY Left 01/02/2017  ? Procedure: LEFT KNEE ARTHROSCOPY WITH PARTIAL MEDIAL MENISCECTOMY;  Surgeon: Mcarthur Rossetti, MD;  Location: [facility];  Service: Orthopedics;  Laterality: Left;  ? POSTERIOR CERVICAL FUSION/FORAMINOTOMY N/A 08/20/2020  ? Procedure: LEFT CERVICAL SIX THROUGH SEVEN  AND CERVICAL SEVEN THROUGH THORACIC ONE  FORAMINOTOMIES WITH EXCISION OF [place] HERNIATION LEFT CERVICAL SEVEN THROUGH THORACIC ONE;  Surgeon: Jessy Oto, MD;  Location: [facility];  Service: Orthopedics;  Laterality: N/A;  ? TONSILLECTOMY    ? [place] RESECTION  2007  ? UVULOPALATOPHARYNGOPLASTY    ? ?Social History:  reports that he quit smoking about 38 years ago. His smoking use  included cigarettes. He has a 1.50 pack-year smoking history. He has never been exposed to tobacco smoke. He has never used smokeless tobacco. He reports current alcohol use of about 1.0 standard drink per week. He reports that he does not use drugs. ? ?Allergies  ?Allergen Reactions  ? Bee Venom Swelling  ? Duloxetine Shortness Of Breath  ?  Brought on asthma  ? Ppd [Tuberculin Purified Protein Derivative] Other (See Comments)  ?  +ppd NEG Quantferron Gold 3/13 (shows false positive)   ? Fenofibrate Other (See Comments)  ?  Back pain  ? Verapamil Other (See Comments)  ?  Back pain  ? Claritin [Loratadine] Other (See Comments)  ?  UNKNOWN   ? Levofloxacin Diarrhea  ? Other Diarrhea  ?  "Some antibiotics" cause diarrhea   ? ? ?Family History  ?Problem Relation Age of Onset  ? Diabetes Paternal Uncle   ? Cancer Father   ?     lymphoma, colon  ? Diabetes Maternal Grandmother   ? Heart disease Maternal Grandfather   ? Diabetes Maternal Grandfather   ? Diabetes Paternal Grandmother   ? Diabetes Paternal Grandfather   ? Dementia Mother   ? Prostate cancer Maternal Uncle   ? Lung disease Neg Hx   ? Rheumatologic disease Neg Hx   ? ? ?Prior to Admission medications   ?Medication Sig Start Date End Date Taking? Authorizing Provider  ?acetaminophen (TYLENOL) 500 MG tablet Take 1,000 mg by mouth 3 (three) times daily.    [provider]  ?anastrozole (ARIMIDEX) 1 MG tablet Take 1 mg by mouth daily. 12/07/19   [provider]  ?apixaban (ELIQUIS) 5 MG TABS tablet 10 mg bid x 5 days then 5 mg bid 09/27/21   Kristopher Oppenheim, DO  ?ascorbic acid (VITAMIN C) 1000 MG tablet Take 1,000 mg by mouth every evening.    [provider]  ?Azelastine HCl 137 MCG/SPRAY SOLN Place 2 sprays into both nostrils 2 (two) times daily. USE 1 TO 2 SPRAYS EACH NOSTRIL 2 X /DAY 04/11/21   [name], MD  ?benzonatate (TESSALON) 200 MG capsule Take 1 capsule 3 x / day to Prevent Cough                                       /                           TAKE                              BY                 MOUTH ?Patient taking differently: Take 200 mg by mouth 3 (three) times daily. 08/22/21   [name], MD  ?bisoprolol-hydrochlorothiazide (ZIAC) 5-6.25 MG tablet TAKE 1 TABLET BY MOUTH EVERY DAY FOR BLOOD PRESSURE ?Patient taking differently: Take 1 tablet by mouth daily. 06/19/21   [name], MD  ?Botulinum Toxin Type A 200 units SOLR Inject 200 Units into the skin every 3 (three) months. 09/07/14   [provider]  ?budesonide (PULMICORT) 0.5 MG/2ML nebulizer solution Take 2 mLs (0.5 mg total) by nebulization 3 (three) times daily as needed. ?Patient taking differently: Take 0.5 mg by nebulization 2 (two) times daily.  Morning and bedtime 07/16/21  [name], MD  ?celecoxib (CELEBREX) 200 MG capsule TAKE 1 CAPSULE (200 MG TOTAL) BY MOUTH EVERY 12 (TWELVE) HOURS. 09/23/21   Jessy Oto, MD  ?cetaphil (CETAPHIL) lotion Apply topically 2 (two) times daily. ?Patient taking differently: Apply 1 application. topically 2 (two) times daily. 06/09/16   Lavina Hamman, MD  ?cetirizine (ZYRTEC) 10 MG tablet Take 1 tablet (10 mg total) by mouth 2 (two) times daily as needed for allergies (Can take an extra dose during flares). 04/11/21   [name], MD  ?Cholecalciferol (VITAMIN D3) 125 MCG (5000 UT) TABS Take 10,000 Units by mouth 2 (two) times daily. Morning and evening    [provider]  ?Cinnamon 500 MG capsule Take 1,000 mg by mouth 3 (three) times daily.    [provider]  ?clotrimazole-betamethasone (LOTRISONE) cream APPLY TO AFFECTED AREA TWICE A DAY ?Patient taking differently: Apply 1 application. topically 2 (two) times daily as needed (rash). 05/27/17   Vladimir Crofts, PA-C  ?Coenzyme Q10 (CO Q 10 PO) Take 600 mg by mouth daily.    [provider]  ?cyclobenzaprine (FLEXERIL) 10 MG tablet TAKE 1/2 TO 1 TABLET 3 X DAY ONLY IF NEED FOR MUSCLE SPASM (NECK) 10/03/21   [name], MD  ?diclofenac Sodium (VOLTAREN) 1 % GEL APPLY 2 TO 4 GRAMS TOPICALLY 2 TO 4 TIMES DAILY FOR PAIN & INFLAMMATION( OTC NOT COVERED) ?Patient taking differently: 2 g See admin instructions. APPLY 2 TO 4 GRAMS TOPICALLY 2 TO 4 TIMES DAILY FOR PAIN & INFLAMMATION( OTC NOT COVERED) 05/28/21   Liane Comber, NP  ?donepezil (ARICEPT) 23 MG TABS tablet Take 23 mg by mouth daily.    [provider]  ?dupilumab (DUPIXENT) 300 MG/2ML prefilled syringe Inject 300 mg into the skin every 14 (fourteen) days. 03/28/21   [name], MD  ?EPINEPHrine 0.3 mg/0.3 mL IJ SOAJ injection Inject 0.3 mg into the muscle as needed. 04/11/21   [name], MD  ?fluticasone [facility]) 50 MCG/ACT nasal  spray Place 2 sprays into both nostrils daily. Use 1 to 2 sprays each Nostril 2 x /day ?Patient taking differently: Place 2 sprays into both nostrils in the morning and at bedtime. 04/11/21   [name], Jerilynn Mages

## 2021-10-04 NOTE — Social Work (Signed)
Please be advised that the above-named patient will require a short-term nursing home stay-anticipated 30 days or less for rehabilitation and strengthening. The plan is for return home.  

## 2021-10-04 NOTE — Patient Outreach (Addendum)
Received a referral for Mr. Szymczak he is inpatient.  I have sent a message to [facility] Liaison. ?  ?Arville Care, CBCS, CMAA ?[place] Management Assistant ?[place] [facility] ?[phone]   ?

## 2021-10-04 NOTE — Assessment & Plan Note (Addendum)
Due to PNA (or what-ever the infiltrative process seen on imaging is) + COPD exacerbation. ?

## 2021-10-04 NOTE — Evaluation (Signed)
Occupational Therapy Evaluation ?Patient Details ?Name: Mark Harrell. ?MRN: [id number] ?DOB: [birth year] ?Today's Date: 10/04/2021 ? ? ?History of Present Illness Patient is a 61 yo male presenting to the ED with SOB and cough on 10/03/21. Admitted with pneumonia on 10/03/21. Patient with recent admission to ED with SOB on 09/24/21. PMH includes:  morbid obesity, HTN, pre-diabetes, COPD/ Asthma / OSA overlap syndrome. COVID+  ? ?Clinical Impression ?  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 The mother also has aides from 9 am - 9 pm to assist her physically as pt cannot and has not been able to physically assist her.Pt reports his ability to complete ADLs and IADLs has progressively worsened since he got COVID in January 2023, from being able to ambulate community distances mod I with SPC (no AD in the home) to only now being able to tolerate ambulating the short distance to the bathroom with a SPC, and unable to drive. Currently, patient is min A for ADLs, and supervision for short distances limited by HR and activity tolerance. Patient noted to have HR of 125 when sitting after completing toilet transfer (NT placing monitor on stand by and therefore did not see HR during activity) however able to wean to 3L of oxygen at end of session. Patient would greatly benefit from SNF level rehab due to prior level of function and need for increased time to meet goals because of poor activity tolerance, fatigue, and increased HR with minor activity.  ?   ? ?Recommendations for follow up therapy are one component of a multi-disciplinary discharge planning process, led by the attending physician.  Recommendations may be updated based on patient status, additional functional criteria and insurance authorization.  ? ?Follow Up Recommendations ? Skilled nursing-short term rehab (<3 hours/day)  ?  ?Assistance Recommended at Discharge Set up Supervision/Assistance   ?Patient can return home with the following A little help with walking and/or transfers;A lot of help with bathing/dressing/bathroom;Assistance with cooking/housework;Assist for transportation;Help with stairs or ramp for entrance ? ?  ?Functional Status Assessment ? Patient has had a recent decline in their functional status and demonstrates the ability to make significant improvements in function in a reasonable and predictable amount of time.  ?Equipment Recommendations ? Other (comment) (Defer to next venue of care)  ?  ?Recommendations for Other Services   ? ? ?  ?Precautions / Restrictions Precautions ?Precautions: Fall;Other (comment) ?Precaution Comments: watch SpO2 (3-3.5L baseline) ?Restrictions ?Weight Bearing Restrictions: No  ? ?  ? ?Mobility Bed Mobility ?  ?  ?  ?  ?  ?  ?  ?General bed mobility comments: on toilet upon OT arrival ?  ? ?Transfers ?Overall transfer level: Needs assistance ?Equipment used: None ?Transfers: Sit to/from Stand ?Sit to Stand: Supervision ?  ?  ?  ?  ?  ?General transfer comment: Supervision for safety from toilet x1 ?  ? ?  ?Balance Overall balance assessment: Mild deficits observed, not formally tested ?  ?  ?  ?  ?  ?  ?  ?  ?  ?  ?  ?  ?  ?  ?  ?  ?  ?  ?   ? ?ADL either performed or assessed with clinical judgement  ? ?ADL Overall ADL's : Needs assistance/impaired ?Eating/Feeding: Set up;Sitting ?  ?Grooming: Wash/dry hands;Standing;Set up ?  ?Upper Body Bathing: Sitting;Set up ?  ?Lower Body Bathing: Minimal assistance;Moderate assistance;Sitting/lateral  leans;Sit to/from stand ?  ?Upper Body Dressing : Set up;Sitting ?  ?Lower Body Dressing: Minimal assistance;Moderate assistance;Sitting/lateral leans;Sit to/from stand ?  ?Toilet Transfer: Min guard;Ambulation ?  ?Toileting- Clothing Manipulation and Hygiene: Total assistance;Sit to/from stand ?Toileting - Clothing Manipulation Details (indicate cue type and reason): Has toileting aide at baseline ?  ?  ?Functional  mobility during ADLs: Minimal assistance;Cueing for safety;Cueing for sequencing ?General ADL Comments: Patient presenting with decreased activity tolerance, and need for increased time and effort to complete all tasks  ? ? ? ?Vision   ?   ?   ?Perception   ?  ?Praxis   ?  ? ?Pertinent Vitals/Pain Pain Assessment ?Pain Assessment: Faces ?Faces Pain Scale: Hurts a little bit ?Pain Location: lungs when coughing ?Pain Descriptors / Indicators: Discomfort, Grimacing, Guarding ?Pain Intervention(s): Limited activity within patient's tolerance, Monitored during session, Repositioned  ? ? ? ?Hand Dominance   ?  ?Extremity/Trunk Assessment Upper Extremity Assessment ?Upper Extremity Assessment: Generalized weakness ?  ?Lower Extremity Assessment ?Lower Extremity Assessment: Defer to PT evaluation ?  ?Cervical / Trunk Assessment ?Cervical / Trunk Assessment: Other exceptions ?Cervical / Trunk Exceptions: increased body habitus ?  ?Communication Communication ?Communication: No difficulties ?  ?Cognition Arousal/Alertness: Awake/alert ?Behavior During Therapy: Impulsive (minimally with movement) ?Overall Cognitive Status: Within Functional Limits for tasks assessed ?  ?  ?  ?  ?  ?  ?  ?  ?  ?  ?  ?  ?  ?  ?  ?  ?General Comments: Pt is likely at his baseline, displaying some mild impulsive tendencies like quickly moving prior to lines being managed and needing cues to wait to untangle to avoid pulling the lines (likely distracted by SOB and mind set on target to get to chair or toilet) ?  ?  ?General Comments  On RA when on toilet (NT taking him there), returning to recliner and satting at 88-90 on RA, placed on 3L to maintain 90-91%, HR to 125 post ambulation from toilet, sustaining at 112 for remainder of session ? ?  ?Exercises   ?  ?Shoulder Instructions    ? ? ?Home Living Family/patient expects to be discharged to:: Private residence ?Living Arrangements: Parent (bed-bound mother) ?Available Help at Discharge: Family  (Uncle and pt assist each other as able, but Uncle also is caregiver of pt's mother and has deficits of his own) ?Type of Home: House ?Home Access: Stairs to enter ?Entrance Stairs-Number of Steps: 2 + 1 ?Entrance Stairs-Rails: None ?Home Layout: Two level;Able to live on main level with bedroom/bathroom (does not go upstairs) ?  ?  ?Bathroom Shower/Tub: Gaffer;Tub only ?  ?Bathroom Toilet: Handicapped height ?  ?  ?Home Equipment: Rollator (4 wheels);Cane - single Barista (2 wheels);Adaptive equipment ?Adaptive Equipment: Other (Comment) (toileting AE) ?Additional Comments: Pt sleeps in recliner. On 3-3.5L O2 at baseline. Aides take care of pt's mother 9 am - 9 pm. ?  ? ?  ?Prior Functioning/Environment Prior Level of Function : Independent/Modified Independent ?  ?  ?  ?  ?  ?  ?Mobility Comments: Before this January when he got COVID he was able to ambulate long distances without much difficulty. Only used cane in community. In the past few weeks he began to use the cane in the home also and intermittently ambulated without the cane short distances in the home, mod I. Has been having increased difficulty with breathing and thus endurance since getting COVID. Progressive decline in distance  he is able to ambulate since getting COVID. Was ambulating in community prior to Center, was able to walk around home ~1 month ago, now only goes the distance to the bathroom, sometimes scooting there on the rollator seat. ?ADLs Comments: Pt is a caregiver of his bed-bound mother who has dementia. Pt manages finances and chores for mother, but cannot assist her physically due to his own deficits. Increased difficulty with ADLs due to endurance deficits, thereby began sponge bathing instead of showering. Uses AE for toileting ?  ? ?  ?  ?OT Problem List: Decreased strength;Decreased activity tolerance;Impaired balance (sitting and/or standing);Decreased coordination;Decreased safety awareness;Cardiopulmonary  status limiting activity;Obesity;Decreased knowledge of precautions ?  ?   ?OT Treatment/Interventions: Self-care/ADL training;Therapeutic exercise;Energy conservation;DME and/or AE instruction;Therapeutic

## 2021-10-04 NOTE — ED Notes (Signed)
[facility]ed [facility] to transport patient to [place] room 12 ?

## 2021-10-04 NOTE — NC FL2 (Signed)
?[place] MEDICAID FL2 LEVEL OF CARE SCREENING TOOL  ?  ? ?IDENTIFICATION  ?Patient Name: ?Mark Harrell. Birthdate: [birth year] Sex: male Admission Date (Current Location): ?10/03/2021  ?[place] and [place] Number: ? [place] ?  Facility and Address:  ?[facility] [place] [address], [place], [place] [address] ?     Provider Number: ?[id number]  ?Attending Physician Name and Address:  ?Thurnell Lose, MD ? Relative Name and Phone Number:  ?Hughes Better [phone] ?   ?Current Level of Care: ?Hospital Recommended Level of Care: ?[place] Prior Approval Number: ?  ? ?Date Approved/Denied: ?  PASRR Number: ?Pending ? ?Discharge Plan: ?SNF ?  ? ?Current Diagnoses: ?Patient Active Problem List  ? Diagnosis Date Noted  ? Hypokalemia 09/26/2021  ? Dyspnea 09/25/2021  ? Acute deep vein thrombosis (DVT) of right lower extremity ([place]) 09/24/2021  ? OSA (obstructive sleep apnea) 09/24/2021  ? Herniation of cervical intervertebral disc with radiculopathy   ? Status post cervical discectomy 08/20/2020  ? Tracheomalacia 05/26/2019  ? Acquired tracheomalacia 05/26/2019  ? HNP (herniated nucleus pulposus) with myelopathy, cervical 05/23/2019  ? Spinal stenosis of cervical region 05/23/2019  ? Status post cervical spinal fusion 05/23/2019  ? Acute on chronic respiratory failure with hypoxia ([place]) 04/12/2018  ? Bilateral lower extremity edema 12/29/2017  ? Hymenoptera allergy 11/10/2017  ? Family history of colonic polyps 08/07/2017  ? Myofascial pain 08/06/2017  ? Seasonal and perennial allergic rhinitis 04/23/2017  ? 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/2017  ? Cervicalgia 04/01/2017  ? Left lower lobe pneumonia 03/09/2017  ? Other spondylosis with radiculopathy, cervical region 02/09/2017  ? Depression with anxiety 01/04/2017  ? Memory difficulty 12/05/2016  ? Morbid obesity ([place]) 12/05/2016  ? Migraine without aura and without status migrainosus, not intractable 10/30/2016  ? Lumbar radiculopathy  05/15/2016  ? Osteoarthritis of spine with radiculopathy, lumbar region 05/15/2016  ? Risk for falls 05/15/2016  ? Recurrent infections 03/29/2016  ? Chronic nonseasonal allergic rhinitis due to pollen 03/29/2016  ? Polypharmacy 01/16/2016  ? Morbid obesity with BMI of 45.0-49.9, adult ([place]) 03/06/2015  ? SDAT 02/05/2015  ? OSA and COPD overlap syndrome ([place]) 02/05/2015  ? Medication management 08/02/2014  ? GERD (gastroesophageal reflux disease) 05/09/2014  ? Vitamin D deficiency 08/01/2013  ? Prediabetes 08/01/2013  ? Positive TB test 07/29/2011  ? Diverticula of colon 05/07/2011  ? Hypertension 01/31/2011  ? Hyperlipidemia, mixed 01/31/2011  ? BPH (benign prostatic hyperplasia) 01/31/2011  ? Testosterone Deficiency 01/31/2011  ? IBS (irritable bowel syndrome) 01/31/2011  ? Partial complex seizure disorder with intractable epilepsy ([place]) 01/31/2011  ? Depression, major, recurrent, in partial remission ([place]) 01/31/2011  ? Asthma-COPD overlap syndrome ([place]) 01/31/2011  ? ? ?Orientation RESPIRATION BLADDER Height & Weight   ?  ?Self, Time, Situation, Place ? O2 ([place] 3, 2L at baseline) Continent Weight: (!) 378 lb 1.4 oz (171.5 kg) ?Height:  6' (182.9 cm)  ?BEHAVIORAL SYMPTOMS/MOOD NEUROLOGICAL BOWEL NUTRITION STATUS  ?    Continent Diet (See DC summary)  ?AMBULATORY STATUS COMMUNICATION OF NEEDS Skin   ?Supervision Verbally Normal ?  ?  ?  ?    ?     ?     ? ? ?Personal Care Assistance Level of Assistance  ?Bathing, Feeding, Dressing Bathing Assistance: Limited assistance ?Feeding assistance: Independent ?Dressing Assistance: Limited assistance ?   ? ?Functional Limitations Info  ?Sight, Hearing, Speech Sight Info: Impaired ?Hearing Info: Adequate ?Speech Info: Adequate  ? ? ?SPECIAL CARE FACTORS  FREQUENCY  ?PT (By licensed PT), OT (By licensed OT)   ?  ?PT Frequency: 5x week ?OT Frequency: 5x week ?  ?  ?  ?   ? ? ?Contractures Contractures Info: Not present  ? ? ?Additional Factors Info  ?Code Status, Allergies,  Psychotropic, Insulin Sliding Scale Code Status Info: Full ?Allergies Info: Bee Venom   Cymbalta (Duloxetine Hcl)   Ppd (Tuberculin Purified Protein Derivative)   Calan (Verapamil)   Tricor (Fenofibrate)   Claritin (Loratadine)   Levaquin (Levofloxacin ?Psychotropic Info: Sertraline ?Insulin Sliding Scale Info: Insulin Aspart (Novolog) 0-15 U 3x week w/ meals ?  ?   ? ?Current Medications (10/04/2021):  This is the current hospital active medication list ?Current Facility-Administered Medications  ?Medication Dose Route Frequency Provider Last Rate Last Admin  ? albuterol (PROVENTIL) (2.5 MG/3ML) 0.083% nebulizer solution 2.5 mg  2.5 mg Nebulization Q2H PRN Etta Quill, DO      ? apixaban Arne [place]) tablet 5 mg  5 mg Oral BID Jennette Kettle M, DO   5 mg at 10/04/21 1104  ? ascorbic acid (VITAMIN C) tablet 1,000 mg  1,000 mg Oral QPM Thurnell Lose, MD      ? aspirin EC tablet 81 mg  81 mg Oral Daily Thurnell Lose, MD   81 mg at 10/04/21 1104  ? benzonatate (TESSALON) capsule 200 mg  200 mg Oral TID Thurnell Lose, MD   200 mg at 10/04/21 1104  ? Chlorhexidine Gluconate Cloth 2 % PADS 6 each  6 each Topical Daily Shela Leff, MD   6 each at 10/04/21 0119  ? cholecalciferol (VITAMIN D3) tablet 10,000 Units  10,000 Units Oral BID Thurnell Lose, MD   10,000 Units at 10/04/21 1104  ? darifenacin (ENABLEX) 24 hr tablet 7.5 mg  7.5 mg Oral Daily Thurnell Lose, MD      ? donepezil (ARICEPT) tablet 23 mg  23 mg Oral Daily Thurnell Lose, MD   23 mg at 10/04/21 1104  ? feeding supplement (ENSURE ENLIVE / ENSURE PLUS) liquid 237 mL  237 mL Oral BID BM Thurnell Lose, MD      ? insulin aspart (novoLOG) injection 0-15 Units  0-15 Units Subcutaneous TID WC Etta Quill, DO   3 Units at 10/04/21 1205  ? lactated ringers infusion   Intravenous Continuous Thurnell Lose, MD 100 mL/hr at 10/04/21 1126 New Bag at 10/04/21 1126  ? MEDLINE mouth rinse  15 mL Mouth Rinse BID Shela Leff, MD    15 mL at 10/04/21 1112  ? memantine (NAMENDA) tablet 10 mg  10 mg Oral BID Thurnell Lose, MD   10 mg at 10/04/21 1104  ? metoprolol tartrate (LOPRESSOR) tablet 25 mg  25 mg Oral BID Thurnell Lose, MD      ? mometasone-formoterol [facility]) 200-5 MCG/ACT inhaler 2 puff  2 puff Inhalation BID Etta Quill, DO   2 puff at 10/04/21 0731  ? pravastatin (PRAVACHOL) tablet 40 mg  40 mg Oral q1800 Thurnell Lose, MD      ? predniSONE (DELTASONE) tablet 40 mg  40 mg Oral Q breakfast Etta Quill, DO   40 mg at 10/04/21 9563  ? pregabalin (LYRICA) capsule 150 mg  150 mg Oral BID Jennette Kettle M, DO   150 mg at 10/04/21 1104  ? sertraline (ZOLOFT) tablet 100 mg  100 mg Oral BID Thurnell Lose, MD   100 mg at 10/04/21  1104  ? umeclidinium bromide (INCRUSE ELLIPTA) 62.5 MCG/ACT 1 puff  1 puff Inhalation Daily Jennette Kettle M, DO   1 puff at 10/04/21 0732  ? ? ? ?Discharge Medications: ?Please see discharge summary for a list of discharge medications. ? ?Relevant Imaging Results: ? ?Relevant Lab Results: ? ? ?Additional Information ?SS# [phone] ? ?Coralee Pesa, LCSWA ? ? ? ? ?

## 2021-10-04 NOTE — Progress Notes (Signed)
PT Cancellation Note ? ?Patient Details ?Name: Mark Harrell. ?MRN: [id number] ?DOB: 1961-05-22 ? ? ?Cancelled Treatment:    Reason Eval/Treat Not Completed: Patient at procedure or test/unavailable. Will plan to follow-up later as time permits. ? ? ?Moishe Spice, PT, DPT ?Acute Rehabilitation Services  ?Pager: [phone] ?Office: [phone] ? ? ? ?[name] ?10/04/2021, 10:33 AM ?

## 2021-10-04 NOTE — Consult Note (Signed)
? ?  [facility] CM Inpatient Consult ? ? ?10/04/2021 ? ?[name] ?06/23/1960 ?[id number] ? ?[place] Organization [ACO] Patient: [employer] ? ?Primary Care Provider:  [name], MD, [facility], is an Embedded provider that is listed to provide the [facility] follow up call and appointments ? ?*Referral request received to follow up ?**Patient on Airborne Precaution due to COVID + ? ?Telephone Follow up ? ?Patient evaluated for community based chronic complex disease management services with [place] [facility] as a benefit of patient's Lawyer. Spoke with via hospital bedside phone, HIPAA verified, to explain [place] Management services.  Explained the reason for the call and patient states that he appreciated the follow up.  He states, "I am really down and I think they're looking into rehab for me when I leave here." ? ?  ?Patient thanked Armed forces training and education officer for the call and will continue to follow. ?  ? Of note, [facility] Care Management services does not replace or interfere with any services that are arranged by inpatient case management or social work.  For additional questions or referrals please contact:   ? ?Natividad Brood, RN BSN CCM ?[place] [facility] Liaison ? [phone] business mobile phone ?Toll free office [phone]  ?Fax number: [phone] ?[email or web address]  [email or web address] ? ? ?

## 2021-10-04 NOTE — ED Notes (Deleted)
Called [facility] to transport patient to [place] room 8 ?

## 2021-10-04 NOTE — Assessment & Plan Note (Signed)
With acute exacerbation. ?1. Steroids ?2. COPD pathway ?3. PRN and scheduled nebs. ?

## 2021-10-04 NOTE — ED Notes (Signed)
[facility] at bedside 

## 2021-10-04 NOTE — Assessment & Plan Note (Signed)
Med Rec pending. ?

## 2021-10-04 NOTE — Plan of Care (Signed)

## 2021-10-04 NOTE — Progress Notes (Signed)
Pt adamant that we not perform CBGs on his fingers, as it causes prolonged hypersensitivity in his fingers that last for days. RN endorses that CBG machines aren't validated for use on any other draw sites, and asked for providers input. Techs have been utilizing ear lobe at patient's request. Ronnie Derby MD notified via secure chat. Per provider, okay to continue ear lobe sticks for CBG monitoring.  ?

## 2021-10-04 NOTE — Plan of Care (Signed)
Discussed with patient plan of care for the evening, pain management and admission questions with some teach back displayed ? ?Problem: Education: ?Goal: Knowledge of General Education information will improve ?Description: Including pain rating scale, medication(s)/side effects and non-pharmacologic comfort measures ?Outcome: Progressing ?  ?

## 2021-10-04 NOTE — Progress Notes (Signed)
Initial Nutrition Assessment ? ?DOCUMENTATION CODES:  ? ?Morbid obesity ? ?INTERVENTION:  ? ?- Liberalize diet to Regular to promote PO intake ? ?- Ensure Enlive po BID, each supplement provides 350 kcal and 20 grams of protein. ? ?- Encourage PO intake ? ?NUTRITION DIAGNOSIS:  ? ?Increased nutrient needs related to acute illness as evidenced by estimated needs. ? ?GOAL:  ? ?Patient will meet greater than or equal to 90% of their needs ? ?MONITOR:  ? ?PO intake, Supplement acceptance, Labs, Weight trends ? ?REASON FOR ASSESSMENT:  ? ?Consult ?Assessment of nutrition requirement/status ? ?ASSESSMENT:  ? ?61 year old male who presented to the ED on 5/11 with SOB and worsening cough. PMH of COPD, HTN, anxiety, depression, GERD, HLD, HTN, IBS. Pt admitted with LLL pneumonia. ? ?Attempted to speak with pt at bedside; however, pt unavailable at time of RD visit. ? ?Pt currently on a Carb Modified diet with no meal completions charted. Reviewed weight history in chart. Noted weight has slowly trended up over the last year. Pt weighed 161.5 kg on 09/03/20 and current weight is documented as 171.5 kg. Do not suspect pt with malnutrition at this time. ? ?Given increased nutrient needs related to acute illness, RD to order oral nutrition supplements. Will also liberalize diet to regular to provide more options and promote PO intake. ? ?Admit weight: 174.2 kg ?Current weight: 171.5 kg ? ?Per nursing edema assessment, pt with mild pitting generalized edema, non-pitting edema to BUE, and mild pitting edema to BLE. Suspect dry weight is lower than current weight. ? ?Medications reviewed and include: vitamin C, cholecalciferol, SSI, prednisone ?IVF: LR @ 100 ml/hr ? ?Labs reviewed: BUN 27, creatinine 1.49, WBC 11.7 ?CBG's: 191 ? ?NUTRITION - FOCUSED PHYSICAL EXAM: ? ?Unable to complete at this time. Pt unavailable. ? ?Diet Order:   ?Diet Order   ? ?       ?  Diet regular Room service appropriate? Yes; Fluid consistency: Thin  Diet  effective now       ?  ? ?  ?  ? ?  ? ? ?EDUCATION NEEDS:  ? ?No education needs have been identified at this time ? ?Skin:  Skin Assessment: Reviewed RN Assessment ? ?Last BM:  10/03/21 ? ?Height:  ? ?Ht Readings from Last 1 Encounters:  ?10/04/21 6' (1.829 m)  ? ? ?Weight:  ? ?Wt Readings from Last 1 Encounters:  ?10/04/21 (!) 171.5 kg  ? ? ?BMI:  Body mass index is 51.28 kg/m?. ? ?Estimated Nutritional Needs:  ? ?Kcal:  2300-2500 ? ?Protein:  120-140 grams ? ?Fluid:  >2.2 L ? ? ? ?Gustavus Bryant, MS, RD, LDN ?Inpatient Clinical Dietitian ?Please see AMiON for contact information. ? ?

## 2021-10-04 NOTE — Assessment & Plan Note (Signed)
Hold metformin Mod scale SSI AC 

## 2021-10-04 NOTE — Assessment & Plan Note (Signed)
Continue eliquis started during last admit. ?

## 2021-10-05 DIAGNOSIS — J189 Pneumonia, unspecified organism: Secondary | ICD-10-CM | POA: Diagnosis not present

## 2021-10-05 DIAGNOSIS — J9621 Acute and chronic respiratory failure with hypoxia: Secondary | ICD-10-CM | POA: Diagnosis not present

## 2021-10-05 DIAGNOSIS — I82461 Acute embolism and thrombosis of right calf muscular vein: Secondary | ICD-10-CM | POA: Diagnosis not present

## 2021-10-05 DIAGNOSIS — J45901 Unspecified asthma with (acute) exacerbation: Secondary | ICD-10-CM | POA: Diagnosis not present

## 2021-10-05 LAB — CBC WITH DIFFERENTIAL/PLATELET
Abs Immature Granulocytes: 0.16 10*3/uL — ABNORMAL HIGH (ref 0.00–0.07)
Basophils Absolute: 0.1 10*3/uL (ref 0.0–0.1)
Basophils Relative: 0 %
Eosinophils Absolute: 0 10*3/uL (ref 0.0–0.5)
Eosinophils Relative: 0 %
HCT: 41.4 % (ref 39.0–52.0)
Hemoglobin: 13.7 g/dL (ref 13.0–17.0)
Immature Granulocytes: 1 %
Lymphocytes Relative: 16 %
Lymphs Abs: 2.9 10*3/uL (ref 0.7–4.0)
MCH: 30.6 pg (ref 26.0–34.0)
MCHC: 33.1 g/dL (ref 30.0–36.0)
MCV: 92.4 fL (ref 80.0–100.0)
Monocytes Absolute: 2.3 10*3/uL — ABNORMAL HIGH (ref 0.1–1.0)
Monocytes Relative: 13 %
Neutro Abs: 12.8 10*3/uL — ABNORMAL HIGH (ref 1.7–7.7)
Neutrophils Relative %: 70 %
Platelets: 301 10*3/uL (ref 150–400)
RBC: 4.48 MIL/uL (ref 4.22–5.81)
RDW: 16.1 % — ABNORMAL HIGH (ref 11.5–15.5)
WBC: 18.3 10*3/uL — ABNORMAL HIGH (ref 4.0–10.5)
nRBC: 0 % (ref 0.0–0.2)

## 2021-10-05 LAB — COMPREHENSIVE METABOLIC PANEL
ALT: 20 U/L (ref 0–44)
AST: 28 U/L (ref 15–41)
Albumin: 3.4 g/dL — ABNORMAL LOW (ref 3.5–5.0)
Alkaline Phosphatase: 50 U/L (ref 38–126)
Anion gap: 12 (ref 5–15)
BUN: 34 mg/dL — ABNORMAL HIGH (ref 6–20)
CO2: 29 mmol/L (ref 22–32)
Calcium: 10.8 mg/dL — ABNORMAL HIGH (ref 8.9–10.3)
Chloride: 95 mmol/L — ABNORMAL LOW (ref 98–111)
Creatinine, Ser: 1.09 mg/dL (ref 0.61–1.24)
GFR, Estimated: >60 mL/min (ref >60)
Glucose, Bld: 139 mg/dL — ABNORMAL HIGH (ref 70–99)
Potassium: 3.7 mmol/L (ref 3.5–5.1)
Sodium: 136 mmol/L (ref 135–145)
Total Bilirubin: 1.3 mg/dL — ABNORMAL HIGH (ref 0.3–1.2)
Total Protein: 6.2 g/dL — ABNORMAL LOW (ref 6.5–8.1)

## 2021-10-05 LAB — GLUCOSE, CAPILLARY
Glucose-Capillary: 140 mg/dL — ABNORMAL HIGH (ref 70–99)
Glucose-Capillary: 190 mg/dL — ABNORMAL HIGH (ref 70–99)
Glucose-Capillary: 161 mg/dL — ABNORMAL HIGH (ref 70–99)
Glucose-Capillary: 120 mg/dL — ABNORMAL HIGH (ref 70–99)

## 2021-10-05 LAB — MAGNESIUM: Magnesium: 2.2 mg/dL (ref 1.7–2.4)

## 2021-10-05 LAB — PROCALCITONIN: Procalcitonin: <0.1 ng/mL

## 2021-10-05 LAB — C-REACTIVE PROTEIN: CRP: 1.1 mg/dL — ABNORMAL HIGH (ref <1.0)

## 2021-10-05 MED ORDER — SODIUM CHLORIDE 0.9 % IV SOLN
500.0000 mg | INTRAVENOUS | Status: DC
  Administered 2021-10-05 – 2021-10-06 (×2): 500 mg via INTRAVENOUS
  Filled 2021-10-05 (×2): qty 5

## 2021-10-05 MED ORDER — FUROSEMIDE 10 MG/ML IJ SOLN
60.0000 mg | Freq: Every day | INTRAMUSCULAR | Status: DC
  Administered 2021-10-05 – 2021-10-07 (×3): 60 mg via INTRAVENOUS
  Filled 2021-10-05 (×4): qty 6

## 2021-10-05 MED ORDER — SODIUM CHLORIDE 0.9 % IV SOLN
1.0000 g | INTRAVENOUS | Status: AC
  Administered 2021-10-05 – 2021-10-07 (×3): 1 g via INTRAVENOUS
  Filled 2021-10-05 (×4): qty 10

## 2021-10-05 MED ORDER — SODIUM CHLORIDE 0.9 % IV SOLN
1.0000 g | Freq: Once | INTRAVENOUS | Status: AC
  Administered 2021-10-05: 1 g via INTRAVENOUS
  Filled 2021-10-05: qty 10

## 2021-10-05 MED ORDER — POTASSIUM CHLORIDE CRYS ER 20 MEQ PO TBCR
40.0000 meq | EXTENDED_RELEASE_TABLET | Freq: Every day | ORAL | Status: DC
Start: 2021-10-05 — End: 2021-10-08
  Administered 2021-10-05 – 2021-10-07 (×3): 40 meq via ORAL
  Filled 2021-10-05 (×3): qty 2

## 2021-10-05 NOTE — Plan of Care (Signed)

## 2021-10-05 NOTE — Plan of Care (Signed)

## 2021-10-05 NOTE — Progress Notes (Addendum)
?                                  PROGRESS NOTE                                             ?                                                                                                                     ?                                         ? ? Patient Demographics:  ? ? Mark Harrell, is a 61 y.o. male, DOB - May 08, 1961, ACZ:[id number] ? ?Outpatient Primary MD for the patient is Mark Pinto, MD    LOS - 1  Admit date - 10/03/2021   ? ?Chief Complaint  ?Patient presents with  ? Shortness of Breath  ?    ? ?Brief Narrative (HPI from H&P)  61 y.o. male with medical history significant of morbid obesity, HTN, pre-diabetes.  COPD/ Asthma / OSA overlap syndrome, chronic hypoxic respiratory failure on 2 L nasal cannula oxygen, acquired secondary tracheomalacia per patient.  He is on Dupixent for his Asthma.  Pt had COVID PNA in Jan 2023, has had SOB and dyspnea ever since then. Pt diagnosed with BLL PNA on CT scan in early April 2023.  Failed outpt Ceftin, also failed outpt Levaquin.  Admitted to hospital 5/2-5/5, got ABx.  Also found to have DVT though no PE at that time, started on eliquis.  He now presents back after having difficulty to breathe, had to bump up his oxygen to 4 L, was feeling that his throat was closing up and he was having a tough time to take a deep breath.  He was diagnosed with acute on chronic hypoxic failure requiring 4 L nasal cannula oxygen, left lower lobe pneumonia along with COPD exacerbation and admitted to the hospital for ?  ? ? Subjective:  ? ?Patient sleeping in recliner, no headache chest or abdominal pain, minimal wheezing but better than his baseline, improved shortness of breath.  Overall feels better. ? ? Assessment  & Plan :  ? ? ?Acute on chronic hypoxic respiratory failure patient baseline at 2 L nasal cannula oxygen now on 4, asthma/COPD exacerbation, left lower lobe pneumonia along with history of acquired tracheomalacia. ? ?He has  been placed on combination of steroids, antibiotics along with nebulizer treatments and 4 L nasal cannula oxygen which is up from 2 L which he uses at baseline, he is already feeling better, most of his symptoms were likely due to worsening of his tracheomalacia, currently no upper airway wheezing and he  feels about 80% better than his presenting symptoms.  Start tapering steroids, continue to monitor closely, if better discharge in the next 1 to 2 days with outpatient pulmonary follow-up. ? ? ?Acute deep vein thrombosis (DVT) of right lower extremity ([place]) - diagnosed recently on a previous hospital admission, continue with Eliquis. ?  ?Morbid obesity.  BMI of 51.  Follow with PCP for weight loss. ? ?OSA/OHS.  Does not wear CPAP at home, continue nighttime oxygen, sleeps in a recliner. ? ?AKI.  Resolved after hydration stable renal ultrasound.   ? ?Chronic diastolic CHF EF over 84% on recent echocardiogram.  Continue diuretics at home dose and monitor  ? ?bilateral pulmonary nodules found on high-resolution CT upon admission.  Outpatient follow-up with his pulmonologist Mannam within a month for repeat imaging if needed and monitoring.   ? ?Prediabetes - Hold metformin, ISS as on steroids ? ?Lab Results  ?Component Value Date  ? HGBA1C 5.8 (H) 10/04/2021  ? ?CBG (last 3)  ?Recent Labs  ?  10/04/21 ?1643 10/04/21 ?2056 10/05/21 ?0559  ?GLUCAP 171* 200* 140*  ? ? ? ?   ? ?Condition -  Guarded ? ?Family Communication  : None present ? ?Code Status :  Full ? ?Consults  :  None ? ?PUD Prophylaxis :  ? ? Procedures  :    ? ? CT - 1. Bilateral solid pulmonary nodules and peripheral masslike opacities which are most pronounced in the lower lungs, several have increased in size when compared with September 03, 2021 prior exam. Differential considerations include inflammatory etiologies such as organizing pneumonia, atypical infection such as fungal pneumonia, or neoplasm. Findings are atypical for post COVID fibrosis. Recommend  short-term follow-up in 2-3 months to ensure resolution. 2. Dilated main pulmonary artery, findings can be seen the setting of pulmonary hypertension. ? ?Renal US - 1. Limited ultrasound examination due to body habitus. 2. Mild renal cortical thinning suggested. No renal lesions or hydronephrosis.  ? ?   ? ?Disposition Plan  :   ? ?Status is: Inpatient ? ?DVT Prophylaxis  :   ? ?Place TED hose Start: 10/04/21 0827 ?apixaban (ELIQUIS) tablet 5 mg  ?  ? ?Lab Results  ?Component Value Date  ? PLT 301 10/05/2021  ? ? ?Diet :  ?Diet Order   ? ?       ?  Diet regular Room service appropriate? Yes; Fluid consistency: Thin  Diet effective now       ?  ? ?  ?  ? ?  ?  ? ?Inpatient Medications ? ?Scheduled Meds: ? apixaban  5 mg Oral BID  ? ascorbic acid  1,000 mg Oral QPM  ? aspirin EC  81 mg Oral Daily  ? benzonatate  200 mg Oral TID  ? Chlorhexidine Gluconate Cloth  6 each Topical Daily  ? cholecalciferol  10,000 Units Oral BID  ? donepezil  23 mg Oral Daily  ? feeding supplement  237 mL Oral BID BM  ? furosemide  60 mg Intravenous Daily  ? insulin aspart  0-15 Units Subcutaneous TID WC  ? mouth rinse  15 mL Mouth Rinse BID  ? memantine  10 mg Oral BID  ? metoprolol tartrate  25 mg Oral BID  ? mometasone-formoterol  2 puff Inhalation BID  ? potassium chloride  40 mEq Oral Daily  ? pravastatin  40 mg Oral q1800  ? predniSONE  40 mg Oral Q breakfast  ? pregabalin  150 mg Oral BID  ?  sertraline  100 mg Oral BID  ? umeclidinium bromide  1 puff Inhalation Daily  ? ?Continuous Infusions: ? azithromycin 500 mg (10/05/21 0405)  ? [START ON 10/06/2021] cefTRIAXone (ROCEPHIN)  IV    ? ?PRN Meds:.albuterol ? ?Time Spent in minutes  30 ? ? ?Lala Lund M.D on 10/05/2021 at 8:37 AM ? ?To page go to [email or web address]  ? ?[facility] -  Office  [phone] ? ?See all Orders from today for further details ? ? ? Objective:  ? ?Vitals:  ? 10/04/21 1954 10/04/21 2314 10/05/21 0309 10/05/21 0720  ?BP:  125/78 137/88 133/75  ?Pulse:  98  91 93  ?Resp:  '18 16 15  '$ ?Temp:  98.5 ?F (36.9 ?C) 98.2 ?F (36.8 ?C) 98.2 ?F (36.8 ?C)  ?TempSrc:  Oral Oral Oral  ?SpO2: 91% 100% 92% 93%  ?Weight:      ?Height:      ? ? ?Wt Readings from Last 3 Encounters:  ?10/04/21 (!) 171.5 kg  ?09/27/21 (!) 174.2 kg  ?09/17/21 (!) 174.6 kg  ? ? ? ?Intake/Output Summary (Last 24 hours) at 10/05/2021 0837 ?Last data filed at 10/05/2021 0405 ?Gross per 24 hour  ?Intake 1432.19 ml  ?Output --  ?Net 1432.19 ml  ? ? ? ?Physical Exam ? ?Awake Alert, No new F.N deficits, Normal affect ?[place].AT,PERRAL ?Supple Neck, No JVD,   ?Symmetrical Chest wall movement, Good air movement bilaterally, mild upper airway wheezing ?RRR,No Gallops, Rubs or new Murmurs,  ?+ve B.Sounds, Abd Soft, No tenderness,   ?No Cyanosis, Clubbing or edema  ? ?  ? ? Data Review:  ? ? ?CBC ?Recent Labs  ?Lab 10/03/21 ?2112 10/04/21 ?0605 10/05/21 ?0040  ?WBC 14.6* 11.7* 18.3*  ?HGB 14.6 14.8 13.7  ?HCT 46.3 45.0 41.4  ?PLT 355 311 301  ?MCV 92.4 92.0 92.4  ?MCH 29.1 30.3 30.6  ?MCHC 31.5 32.9 33.1  ?RDW 16.6* 16.0* 16.1*  ?LYMPHSABS 4.2*  --  2.9  ?MONOABS 1.7*  --  2.3*  ?EOSABS 0.2  --  0.0  ?BASOSABS 0.1  --  0.1  ? ? ?Electrolytes ?Recent Labs  ?Lab 10/02/21 ?1508 10/03/21 ?2112 10/03/21 ?2202 10/04/21 ?4627 10/04/21 ?1456 10/05/21 ?0040 10/05/21 ?0409  ?NA 143  --  140 139  --  136  --   ?K 4.1  --  4.2 4.4  --  3.7  --   ?CL 97*  --  91* 91*  --  95*  --   ?CO2 32  --  33* 30  --  29  --   ?GLUCOSE 138*  --  121* 178*  --  139*  --   ?BUN 20  --  23* 27*  --  34*  --   ?CREATININE 1.08  --  1.32* 1.49*  --  1.09  --   ?CALCIUM 10.8*  --  11.5* 11.1*  --  10.8*  --   ?AST 17  --  21  --   --  28  --   ?ALT 15  --  18  --   --  20  --   ?ALKPHOS  --   --  100  --   --  50  --   ?BILITOT 0.8  --  1.1  --   --  1.3*  --   ?ALBUMIN  --   --  4.5  --   --  3.4*  --   ?MG 1.8  --   --   --   --  2.2  --   ?CRP  --   --   --  1.4*  --  1.1*  --   ?PROCALCITON  --   --   --   --  <0.10  --  <0.10  ?HGBA1C  --   --   --   5.8*  --   --   --   ?BNP  --  33.2  --   --   --   --   --   ? ? ?------------------------------------------------------------------------------------------------------------------ ?No results for input(s

## 2021-10-05 NOTE — TOC Progression Note (Addendum)
Transition of Care (TOC) - Progression Note  ? ? ?Patient Details  ?Name: Mark Harrell. ?MRN: [id number] ?Date of Birth: 1960-07-10 ? ?Transition of Care (TOC) CM/SW Contact  ?Bary Castilla, LCSW ?Phone Number: [phone] ?10/05/2021, 12:36 PM ? ?Clinical Narrative:    ? ?1:40 PM- CSW attempted met with pt again however pt did not awaken when name call. Pt still has only 2 bed offers and facility would need to start auth once bed is chosen. ? ?CSW attempted to meet with pt to discuss the 2 bed offers however it was confirmed bty Tech that pt was sleeping. ? ?CSW notes that once pt choose facility that they will need to get authorization which will not be started until Monday. CSW will receive bed offer from pt at a later time. ? ?TOC team will continue to assist with discharge planning needs.  ? ? ?Expected Discharge Plan: [place] ?Barriers to Discharge: Ship broker, Continued Medical Work up, SNF Pending bed offer ? ?Expected Discharge Plan and Services ?Expected Discharge Plan: [place] ?  ?  ?Post Acute Care Choice: [place] ?Living arrangements for the past 2 months: [place] ?                ?  ?  ?  ?  ?  ?  ?  ?  ?  ?  ? ? ?Social Determinants of Health (SDOH) Interventions ?  ? ?Readmission Risk Interventions ?   ? View : No data to display.  ?  ?  ?  ? ? ?

## 2021-10-06 DIAGNOSIS — R7303 Prediabetes: Secondary | ICD-10-CM | POA: Diagnosis not present

## 2021-10-06 DIAGNOSIS — J449 Chronic obstructive pulmonary disease, unspecified: Secondary | ICD-10-CM | POA: Diagnosis not present

## 2021-10-06 DIAGNOSIS — J9621 Acute and chronic respiratory failure with hypoxia: Secondary | ICD-10-CM | POA: Diagnosis not present

## 2021-10-06 DIAGNOSIS — I82461 Acute embolism and thrombosis of right calf muscular vein: Secondary | ICD-10-CM | POA: Diagnosis not present

## 2021-10-06 LAB — CBC WITH DIFFERENTIAL/PLATELET
Abs Immature Granulocytes: 0.2 10*3/uL — ABNORMAL HIGH (ref 0.00–0.07)
Basophils Absolute: 0.1 10*3/uL (ref 0.0–0.1)
Basophils Relative: 1 %
Eosinophils Absolute: 0 10*3/uL (ref 0.0–0.5)
Eosinophils Relative: 0 %
HCT: 40.7 % (ref 39.0–52.0)
Hemoglobin: 13.4 g/dL (ref 13.0–17.0)
Immature Granulocytes: 1 %
Lymphocytes Relative: 29 %
Lymphs Abs: 4.9 10*3/uL — ABNORMAL HIGH (ref 0.7–4.0)
MCH: 30.7 pg (ref 26.0–34.0)
MCHC: 32.9 g/dL (ref 30.0–36.0)
MCV: 93.1 fL (ref 80.0–100.0)
Monocytes Absolute: 2.6 10*3/uL — ABNORMAL HIGH (ref 0.1–1.0)
Monocytes Relative: 15 %
Neutro Abs: 9.3 10*3/uL — ABNORMAL HIGH (ref 1.7–7.7)
Neutrophils Relative %: 54 %
Platelets: 294 10*3/uL (ref 150–400)
RBC: 4.37 MIL/uL (ref 4.22–5.81)
RDW: 15.9 % — ABNORMAL HIGH (ref 11.5–15.5)
WBC: 17 10*3/uL — ABNORMAL HIGH (ref 4.0–10.5)
nRBC: 0 % (ref 0.0–0.2)

## 2021-10-06 LAB — COMPREHENSIVE METABOLIC PANEL
ALT: 20 U/L (ref 0–44)
AST: 25 U/L (ref 15–41)
Albumin: 3.4 g/dL — ABNORMAL LOW (ref 3.5–5.0)
Alkaline Phosphatase: 48 U/L (ref 38–126)
Anion gap: 12 (ref 5–15)
BUN: 29 mg/dL — ABNORMAL HIGH (ref 6–20)
CO2: 31 mmol/L (ref 22–32)
Calcium: 9.9 mg/dL (ref 8.9–10.3)
Chloride: 96 mmol/L — ABNORMAL LOW (ref 98–111)
Creatinine, Ser: 0.96 mg/dL (ref 0.61–1.24)
GFR, Estimated: >60 mL/min (ref >60)
Glucose, Bld: 89 mg/dL (ref 70–99)
Potassium: 3.6 mmol/L (ref 3.5–5.1)
Sodium: 139 mmol/L (ref 135–145)
Total Bilirubin: 0.8 mg/dL (ref 0.3–1.2)
Total Protein: 5.9 g/dL — ABNORMAL LOW (ref 6.5–8.1)

## 2021-10-06 LAB — GLUCOSE, CAPILLARY
Glucose-Capillary: 121 mg/dL — ABNORMAL HIGH (ref 70–99)
Glucose-Capillary: 190 mg/dL — ABNORMAL HIGH (ref 70–99)
Glucose-Capillary: 132 mg/dL — ABNORMAL HIGH (ref 70–99)
Glucose-Capillary: 157 mg/dL — ABNORMAL HIGH (ref 70–99)

## 2021-10-06 LAB — MAGNESIUM: Magnesium: 2.2 mg/dL (ref 1.7–2.4)

## 2021-10-06 MED ORDER — POTASSIUM CHLORIDE CRYS ER 20 MEQ PO TBCR
40.0000 meq | EXTENDED_RELEASE_TABLET | Freq: Once | ORAL | Status: AC
  Administered 2021-10-06: 40 meq via ORAL
  Filled 2021-10-06: qty 2

## 2021-10-06 MED ORDER — PREDNISONE 20 MG PO TABS
20.0000 mg | ORAL_TABLET | Freq: Every day | ORAL | Status: AC
Start: 2021-10-07 — End: 2021-10-08
  Administered 2021-10-07 – 2021-10-08 (×2): 20 mg via ORAL
  Filled 2021-10-06 (×2): qty 1

## 2021-10-06 MED ORDER — AZITHROMYCIN 500 MG PO TABS
500.0000 mg | ORAL_TABLET | Freq: Every day | ORAL | Status: AC
  Administered 2021-10-07 – 2021-10-08 (×2): 500 mg via ORAL
  Filled 2021-10-06 (×2): qty 1

## 2021-10-06 NOTE — Plan of Care (Signed)

## 2021-10-06 NOTE — Progress Notes (Signed)
?                                  PROGRESS NOTE                                             ?                                                                                                                     ?                                         ? ? Patient Demographics:  ? ? Mark Harrell, is a 61 y.o. male, DOB - 1961/03/18, IWL:[id number] ? ?Outpatient Primary MD for the patient is [name], MD    LOS - 2  Admit date - 10/03/2021   ? ?Chief Complaint  ?Patient presents with  ? Shortness of Breath  ?    ? ?Brief Narrative (HPI from H&P)  61 y.o. male with medical history significant of morbid obesity, HTN, pre-diabetes.  COPD/ Asthma / OSA overlap syndrome, chronic hypoxic respiratory failure on 2 L nasal cannula oxygen, acquired secondary tracheomalacia per patient.  He is on Dupixent for his Asthma.  Pt had COVID PNA in Jan 2023, has had SOB and dyspnea ever since then. Pt diagnosed with BLL PNA on CT scan in early April 2023.  Failed outpt Ceftin, also failed outpt Levaquin.  Admitted to hospital 5/2-5/5, got ABx.  Also found to have DVT though no PE at that time, started on eliquis.  He now presents back after having difficulty to breathe, had to bump up his oxygen to 4 L, was feeling that his throat was closing up and he was having a tough time to take a deep breath.  He was diagnosed with acute on chronic hypoxic failure requiring 4 L nasal cannula oxygen, left lower lobe pneumonia along with COPD exacerbation and admitted to the hospital for ?  ? ? Subjective:  ? ?Patient in bed, appears comfortable, denies any headache, no fever, no chest pain or pressure, no shortness of breath , no abdominal pain. No new focal weakness. ? ? Assessment  & Plan :  ? ? ?Acute on chronic hypoxic respiratory failure patient baseline at 2 L nasal cannula oxygen now on 4, asthma/COPD exacerbation, left lower lobe pneumonia along with history of acquired tracheomalacia. ? ?He has been  placed on combination of steroids, antibiotics along with nebulizer treatments and 4 L nasal cannula oxygen which is up from 2 L which he uses at baseline, he is already feeling better, most of his symptoms were likely due to worsening of his tracheomalacia, currently no upper airway wheezing  and he feels about 80% better than his presenting symptoms.  Start tapering steroids, continue to monitor closely, if better discharge in the next 1 to 2 days with outpatient pulmonary follow-up.  Clinically stable await bed for placement. ? ? ?Acute deep vein thrombosis (DVT) of right lower extremity ([place]) - diagnosed recently on a previous hospital admission, continue with Eliquis. ?  ?Morbid obesity.  BMI of 51.  Follow with PCP for weight loss. ? ?OSA/OHS.  Does not wear CPAP at home, continue nighttime oxygen, sleeps in a recliner. ? ?AKI.  Resolved after hydration stable renal ultrasound.   ? ?Chronic diastolic CHF EF over 09% on recent echocardiogram.  Continue diuretics at home dose and monitor  ? ?bilateral pulmonary nodules found on high-resolution CT upon admission.  Outpatient follow-up with his pulmonologist Mannam within a month for repeat imaging if needed and monitoring.   ? ?Prediabetes - Hold metformin, ISS as on steroids ? ?Lab Results  ?Component Value Date  ? HGBA1C 5.8 (H) 10/04/2021  ? ?CBG (last 3)  ?Recent Labs  ?  10/05/21 ?1653 10/05/21 ?2102 10/06/21 ?0647  ?GLUCAP 161* 120* 121*  ? ? ? ?   ? ?Condition -  Guarded ? ?Family Communication  : None present ? ?Code Status :  Full ? ?Consults  :  None ? ?PUD Prophylaxis :  ? ? Procedures  :    ? ? CT - 1. Bilateral solid pulmonary nodules and peripheral masslike opacities which are most pronounced in the lower lungs, several have increased in size when compared with September 03, 2021 prior exam. Differential considerations include inflammatory etiologies such as organizing pneumonia, atypical infection such as fungal pneumonia, or neoplasm. Findings are  atypical for post COVID fibrosis. Recommend short-term follow-up in 2-3 months to ensure resolution. 2. Dilated main pulmonary artery, findings can be seen the setting of pulmonary hypertension. ? ?Renal US - 1. Limited ultrasound examination due to body habitus. 2. Mild renal cortical thinning suggested. No renal lesions or hydronephrosis.  ? ?   ? ?Disposition Plan  : Clinically stable await SNF/CIR bed. ? ?DVT Prophylaxis  :   ? ?Place TED hose Start: 10/04/21 0827 ?apixaban (ELIQUIS) tablet 5 mg  ?  ? ?Lab Results  ?Component Value Date  ? PLT 294 10/06/2021  ? ? ?Diet :  ?Diet Order   ? ?       ?  Diet regular Room service appropriate? Yes; Fluid consistency: Thin  Diet effective now       ?  ? ?  ?  ? ?  ?  ? ?Inpatient Medications ? ?Scheduled Meds: ? apixaban  5 mg Oral BID  ? ascorbic acid  1,000 mg Oral QPM  ? aspirin EC  81 mg Oral Daily  ? benzonatate  200 mg Oral TID  ? Chlorhexidine Gluconate Cloth  6 each Topical Daily  ? cholecalciferol  10,000 Units Oral BID  ? donepezil  23 mg Oral Daily  ? feeding supplement  237 mL Oral BID BM  ? furosemide  60 mg Intravenous Daily  ? insulin aspart  0-15 Units Subcutaneous TID WC  ? mouth rinse  15 mL Mouth Rinse BID  ? memantine  10 mg Oral BID  ? metoprolol tartrate  25 mg Oral BID  ? mometasone-formoterol  2 puff Inhalation BID  ? potassium chloride  40 mEq Oral Daily  ? potassium chloride  40 mEq Oral Once  ? pravastatin  40 mg Oral q1800  ?  predniSONE  40 mg Oral Q breakfast  ? pregabalin  150 mg Oral BID  ? sertraline  100 mg Oral BID  ? umeclidinium bromide  1 puff Inhalation Daily  ? ?Continuous Infusions: ? azithromycin 500 mg (10/06/21 0346)  ? cefTRIAXone (ROCEPHIN)  IV 1 g (10/05/21 2313)  ? ?PRN Meds:.albuterol ? ?Time Spent in minutes  30 ? ? ?Lala Lund M.D on 10/06/2021 at 8:32 AM ? ?To page go to [email or web address]  ? ?[facility] -  Office  [phone] ? ?See all Orders from today for further details ? ? ? Objective:  ? ?Vitals:  ?  10/05/21 2213 10/05/21 2243 10/06/21 0417 10/06/21 0727  ?BP: 139/76  (!) 145/87 (!) 141/81  ?Pulse: 99 98 88 95  ?Resp:   18 14  ?Temp:  98.6 ?F (37 ?C) 98.7 ?F (37.1 ?C) 98.7 ?F (37.1 ?C)  ?TempSrc:  Oral Oral Oral  ?SpO2:  94% 92% 91%  ?Weight:      ?Height:      ? ? ?Wt Readings from Last 3 Encounters:  ?10/04/21 (!) 171.5 kg  ?09/27/21 (!) 174.2 kg  ?09/17/21 (!) 174.6 kg  ? ? ? ?Intake/Output Summary (Last 24 hours) at 10/06/2021 0832 ?Last data filed at 10/06/2021 0600 ?Gross per 24 hour  ?Intake 820.23 ml  ?Output --  ?Net 820.23 ml  ? ? ? ?Physical Exam ? ?Awake Alert, No new F.N deficits, Normal affect ?[place].AT,PERRAL ?Supple Neck, No JVD,   ?Symmetrical Chest wall movement, Good air movement bilaterally, CTAB ?RRR,No Gallops, Rubs or new Murmurs,  ?+ve B.Sounds, Abd Soft, No tenderness,   ?No Cyanosis, Clubbing or edema  ? ? ? Data Review:  ? ? ?CBC ?Recent Labs  ?Lab 10/03/21 ?2112 10/04/21 ?0605 10/05/21 ?0040 10/06/21 ?0119  ?WBC 14.6* 11.7* 18.3* 17.0*  ?HGB 14.6 14.8 13.7 13.4  ?HCT 46.3 45.0 41.4 40.7  ?PLT 355 311 301 294  ?MCV 92.4 92.0 92.4 93.1  ?MCH 29.1 30.3 30.6 30.7  ?MCHC 31.5 32.9 33.1 32.9  ?RDW 16.6* 16.0* 16.1* 15.9*  ?LYMPHSABS 4.2*  --  2.9 4.9*  ?MONOABS 1.7*  --  2.3* 2.6*  ?EOSABS 0.2  --  0.0 0.0  ?BASOSABS 0.1  --  0.1 0.1  ? ? ?Electrolytes ?Recent Labs  ?Lab 10/02/21 ?1508 10/03/21 ?2112 10/03/21 ?2202 10/04/21 ?0981 10/04/21 ?1456 10/05/21 ?0040 10/05/21 ?0409 10/06/21 ?0119  ?NA 143  --  140 139  --  136  --  139  ?K 4.1  --  4.2 4.4  --  3.7  --  3.6  ?CL 97*  --  91* 91*  --  95*  --  96*  ?CO2 32  --  33* 30  --  29  --  31  ?GLUCOSE 138*  --  121* 178*  --  139*  --  89  ?BUN 20  --  23* 27*  --  34*  --  29*  ?CREATININE 1.08  --  1.32* 1.49*  --  1.09  --  0.96  ?CALCIUM 10.8*  --  11.5* 11.1*  --  10.8*  --  9.9  ?AST 17  --  21  --   --  28  --  25  ?ALT 15  --  18  --   --  20  --  20  ?ALKPHOS  --   --  58  --   --  50  --  48  ?BILITOT 0.8  --  1.1  --   --  1.3*  --   0.8  ?ALBUMIN  --   --  4.5  --   --  3.4*  --  3.4*  ?MG 1.8  --   --   --   --  2.2  --  2.2  ?CRP  --   --   --  1.4*  --  1.1*  --   --   ?PROCALCITON  --   --   --   --  <0.10  --  <0.10  --   ?HGBA1C  --

## 2021-10-06 NOTE — Plan of Care (Signed)
?  Problem: Education: ?Goal: Knowledge of General Education information will improve ?Description: Including pain rating scale, medication(s)/side effects and non-pharmacologic comfort measures ?10/06/2021 2309 by Tom-Johnson, Betti Cruz, RN ?Outcome: Progressing ?10/06/2021 2307 by Tom-Johnson, Betti Cruz, RN ?Outcome: Progressing ?  ?Problem: Activity: ?Goal: Risk for activity intolerance will decrease ?10/06/2021 2309 by Tom-Johnson, Betti Cruz, RN ?Outcome: Progressing ?10/06/2021 2307 by Tom-Johnson, Betti Cruz, RN ?Outcome: Progressing ?  ?Problem: Pain Managment: ?Goal: General experience of comfort will improve ?10/06/2021 2309 by Tom-Johnson, Betti Cruz, RN ?Outcome: Progressing ?10/06/2021 2307 by Tom-Johnson, Betti Cruz, RN ?Outcome: Progressing ?  ?Problem: Safety: ?Goal: Ability to remain free from injury will improve ?10/06/2021 2309 by Tom-Johnson, Betti Cruz, RN ?Outcome: Progressing ?10/06/2021 2307 by Tom-Johnson, Betti Cruz, RN ?Outcome: Progressing ?  ?Problem: Skin Integrity: ?Goal: Risk for impaired skin integrity will decrease ?10/06/2021 2309 by Tom-Johnson, Betti Cruz, RN ?Outcome: Progressing ?10/06/2021 2307 by Tom-Johnson, Betti Cruz, RN ?Outcome: Progressing ?  ?

## 2021-10-07 DIAGNOSIS — R7303 Prediabetes: Secondary | ICD-10-CM | POA: Diagnosis not present

## 2021-10-07 DIAGNOSIS — I82461 Acute embolism and thrombosis of right calf muscular vein: Secondary | ICD-10-CM | POA: Diagnosis not present

## 2021-10-07 DIAGNOSIS — J9621 Acute and chronic respiratory failure with hypoxia: Secondary | ICD-10-CM | POA: Diagnosis not present

## 2021-10-07 DIAGNOSIS — J449 Chronic obstructive pulmonary disease, unspecified: Secondary | ICD-10-CM | POA: Diagnosis not present

## 2021-10-07 LAB — CBC WITH DIFFERENTIAL/PLATELET
Abs Immature Granulocytes: 0.16 10*3/uL — ABNORMAL HIGH (ref 0.00–0.07)
Basophils Absolute: 0.1 10*3/uL (ref 0.0–0.1)
Basophils Relative: 1 %
Eosinophils Absolute: 0.1 10*3/uL (ref 0.0–0.5)
Eosinophils Relative: 0 %
HCT: 39.7 % (ref 39.0–52.0)
Hemoglobin: 13 g/dL (ref 13.0–17.0)
Immature Granulocytes: 1 %
Lymphocytes Relative: 30 %
Lymphs Abs: 4.6 10*3/uL — ABNORMAL HIGH (ref 0.7–4.0)
MCH: 30 pg (ref 26.0–34.0)
MCHC: 32.7 g/dL (ref 30.0–36.0)
MCV: 91.5 fL (ref 80.0–100.0)
Monocytes Absolute: 2.2 10*3/uL — ABNORMAL HIGH (ref 0.1–1.0)
Monocytes Relative: 15 %
Neutro Abs: 8.2 10*3/uL — ABNORMAL HIGH (ref 1.7–7.7)
Neutrophils Relative %: 53 %
Platelets: 264 10*3/uL (ref 150–400)
RBC: 4.34 MIL/uL (ref 4.22–5.81)
RDW: 15.4 % (ref 11.5–15.5)
WBC: 15.3 10*3/uL — ABNORMAL HIGH (ref 4.0–10.5)
nRBC: 0 % (ref 0.0–0.2)

## 2021-10-07 LAB — COMPREHENSIVE METABOLIC PANEL
ALT: 20 U/L (ref 0–44)
AST: 19 U/L (ref 15–41)
Albumin: 3.2 g/dL — ABNORMAL LOW (ref 3.5–5.0)
Alkaline Phosphatase: 44 U/L (ref 38–126)
Anion gap: 9 (ref 5–15)
BUN: 24 mg/dL — ABNORMAL HIGH (ref 6–20)
CO2: 30 mmol/L (ref 22–32)
Calcium: 9.3 mg/dL (ref 8.9–10.3)
Chloride: 101 mmol/L (ref 98–111)
Creatinine, Ser: 0.93 mg/dL (ref 0.61–1.24)
GFR, Estimated: >60 mL/min (ref >60)
Glucose, Bld: 99 mg/dL (ref 70–99)
Potassium: 3.5 mmol/L (ref 3.5–5.1)
Sodium: 140 mmol/L (ref 135–145)
Total Bilirubin: 0.7 mg/dL (ref 0.3–1.2)
Total Protein: 5.8 g/dL — ABNORMAL LOW (ref 6.5–8.1)

## 2021-10-07 LAB — GLUCOSE, CAPILLARY
Glucose-Capillary: 111 mg/dL — ABNORMAL HIGH (ref 70–99)
Glucose-Capillary: 153 mg/dL — ABNORMAL HIGH (ref 70–99)
Glucose-Capillary: 121 mg/dL — ABNORMAL HIGH (ref 70–99)
Glucose-Capillary: 98 mg/dL (ref 70–99)

## 2021-10-07 LAB — MAGNESIUM: Magnesium: 2.1 mg/dL (ref 1.7–2.4)

## 2021-10-07 MED ORDER — POTASSIUM CHLORIDE CRYS ER 20 MEQ PO TBCR
40.0000 meq | EXTENDED_RELEASE_TABLET | Freq: Once | ORAL | Status: AC
  Administered 2021-10-07: 40 meq via ORAL
  Filled 2021-10-07: qty 2

## 2021-10-07 NOTE — Progress Notes (Signed)
Encouraged to use urinal for accurate measurement of the urine since he is taking lasix iv, but refused. Prefers to go the bathroom instead. ?

## 2021-10-07 NOTE — Progress Notes (Signed)
?                                  PROGRESS NOTE                                             ?                                                                                                                     ?                                         ? ? Patient Demographics:  ? ? Mark Harrell, is a 61 y.o. male, DOB - 1960/12/22, OHY:[id number] ? ?Outpatient Primary MD for the patient is [name], MD    LOS - 3  Admit date - 10/03/2021   ? ?Chief Complaint  ?Patient presents with  ? Shortness of Breath  ?    ? ?Brief Narrative (HPI from H&P)  61 y.o. male with medical history significant of morbid obesity, HTN, pre-diabetes.  COPD/ Asthma / OSA overlap syndrome, chronic hypoxic respiratory failure on 2 L nasal cannula oxygen, acquired secondary tracheomalacia per patient.  He is on Dupixent for his Asthma.  Pt had COVID PNA in Jan 2023, has had SOB and dyspnea ever since then. Pt diagnosed with BLL PNA on CT scan in early April 2023.  Failed outpt Ceftin, also failed outpt Levaquin.  Admitted to hospital 5/2-5/5, got ABx.  Also found to have DVT though no PE at that time, started on eliquis.  He now presents back after having difficulty to breathe, had to bump up his oxygen to 4 L, was feeling that his throat was closing up and he was having a tough time to take a deep breath.  He was diagnosed with acute on chronic hypoxic failure requiring 4 L nasal cannula oxygen, left lower lobe pneumonia along with COPD exacerbation and admitted to the hospital for ?  ? ? Subjective:  ? ?Patient in bed, appears comfortable, denies any headache, no fever, no chest pain or pressure, no shortness of breath , no abdominal pain. No new focal weakness. ? ? Assessment  & Plan :  ? ? ?Acute on chronic hypoxic respiratory failure patient baseline at 2 L nasal cannula oxygen now on 4, asthma/COPD exacerbation, left lower lobe pneumonia along with history of acquired tracheomalacia. ? ?He has been  placed on combination of steroids, antibiotics along with nebulizer treatments and  3 L nasal cannula oxygen which is up from 2 L which he uses at baseline, he is already feeling better, most of his symptoms were likely due to worsening of his tracheomalacia, currently no upper airway  wheezing and he feels about 80% better than his presenting symptoms.  Start tapering steroids, continue to monitor closely, if better discharge on 10/08/21 with outpatient pulmonary follow-up.  Clinically stable await bed for placement. ? ? ?Acute deep vein thrombosis (DVT) of right lower extremity ([place]) - diagnosed recently on a previous hospital admission, continue with Eliquis. ?  ?Morbid obesity.  BMI of 51.  Follow with PCP for weight loss. ? ?OSA/OHS.  Does not wear CPAP at home, continue nighttime oxygen, sleeps in a recliner. ? ?AKI.  Resolved after hydration stable renal ultrasound.   ? ?Chronic diastolic CHF EF over 95% on recent echocardiogram.  Continue diuretics at home dose and monitor  ? ?Bilateral pulmonary nodules found on high-resolution CT upon admission.  Outpatient follow-up with his pulmonologist Mannam within a month for repeat imaging if needed and monitoring.   ? ?Prediabetes - Hold metformin, ISS as on steroids ? ?Lab Results  ?Component Value Date  ? HGBA1C 5.8 (H) 10/04/2021  ? ?CBG (last 3)  ?Recent Labs  ?  10/06/21 ?2110 10/07/21 ?0602 10/07/21 ?0730  ?GLUCAP 157* 111* 153*  ? ? ? ?   ? ?Condition -  Guarded ? ?Family Communication  : None present ? ?Code Status :  Full ? ?Consults  :  None ? ?PUD Prophylaxis :  ? ? Procedures  :    ? ? CT - 1. Bilateral solid pulmonary nodules and peripheral masslike opacities which are most pronounced in the lower lungs, several have increased in size when compared with September 03, 2021 prior exam. Differential considerations include inflammatory etiologies such as organizing pneumonia, atypical infection such as fungal pneumonia, or neoplasm. Findings are atypical for  post COVID fibrosis. Recommend short-term follow-up in 2-3 months to ensure resolution. 2. Dilated main pulmonary artery, findings can be seen the setting of pulmonary hypertension. ? ?Renal US - 1. Limited ultrasound examination due to body habitus. 2. Mild renal cortical thinning suggested. No renal lesions or hydronephrosis.  ? ?   ? ?Disposition Plan  : Clinically stable await SNF/CIR bed. ? ?DVT Prophylaxis  :   ? ?Place TED hose Start: 10/04/21 0827 ?apixaban (ELIQUIS) tablet 5 mg  ?  ? ?Lab Results  ?Component Value Date  ? PLT 264 10/07/2021  ? ? ?Diet :  ?Diet Order   ? ?       ?  Diet regular Room service appropriate? Yes; Fluid consistency: Thin  Diet effective now       ?  ? ?  ?  ? ?  ?  ? ?Inpatient Medications ? ?Scheduled Meds: ? apixaban  5 mg Oral BID  ? ascorbic acid  1,000 mg Oral QPM  ? aspirin EC  81 mg Oral Daily  ? azithromycin  500 mg Oral Daily  ? benzonatate  200 mg Oral TID  ? cholecalciferol  10,000 Units Oral BID  ? donepezil  23 mg Oral Daily  ? feeding supplement  237 mL Oral BID BM  ? furosemide  60 mg Intravenous Daily  ? insulin aspart  0-15 Units Subcutaneous TID WC  ? mouth rinse  15 mL Mouth Rinse BID  ? memantine  10 mg Oral BID  ? metoprolol tartrate  25 mg Oral BID  ? mometasone-formoterol  2 puff Inhalation BID  ? potassium chloride  40 mEq Oral Daily  ? potassium chloride  40 mEq Oral Once  ? pravastatin  40 mg Oral q1800  ? predniSONE  20 mg Oral  Q breakfast  ? pregabalin  150 mg Oral BID  ? sertraline  100 mg Oral BID  ? umeclidinium bromide  1 puff Inhalation Daily  ? ?Continuous Infusions: ? cefTRIAXone (ROCEPHIN)  IV 1 g (10/06/21 2324)  ? ?PRN Meds:.albuterol ? ?Time Spent in minutes  30 ? ? ?Lala Lund M.D on 10/07/2021 at 9:33 AM ? ?To page go to [email or web address]  ? ?[facility] -  Office  [phone] ? ?See all Orders from today for further details ? ? ? Objective:  ? ?Vitals:  ? 10/06/21 2325 10/07/21 0404 10/07/21 6812 10/07/21 0848  ?BP: (!) 145/93 (!)  145/73 (!) 146/87   ?Pulse: 82 89 88   ?Resp: 15 14    ?Temp: 98.9 ?F (37.2 ?C) 97.7 ?F (36.5 ?C) 98.4 ?F (36.9 ?C)   ?TempSrc: Oral Oral Oral   ?SpO2: 94% 95% 96% 95%  ?Weight:      ?Height:      ? ? ?Wt Readings from Last 3 Encounters:  ?10/04/21 (!) 171.5 kg  ?09/27/21 (!) 174.2 kg  ?09/17/21 (!) 174.6 kg  ? ? ? ?Intake/Output Summary (Last 24 hours) at 10/07/2021 0933 ?Last data filed at 10/07/2021 0700 ?Gross per 24 hour  ?Intake 600 ml  ?Output --  ?Net 600 ml  ? ? ? ?Physical Exam ? ?Awake Alert, No new F.N deficits, Normal affect ?[place].AT,PERRAL ?Supple Neck, No JVD,   ?Symmetrical Chest wall movement, Good air movement bilaterally, CTAB ?RRR,No Gallops, Rubs or new Murmurs,  ?+ve B.Sounds, Abd Soft, No tenderness,   ?No Cyanosis, Clubbing or edema  ? ? Data Review:  ? ? ?CBC ?Recent Labs  ?Lab 10/03/21 ?2112 10/04/21 ?0605 10/05/21 ?0040 10/06/21 ?0119 10/07/21 ?0250  ?WBC 14.6* 11.7* 18.3* 17.0* 15.3*  ?HGB 14.6 14.8 13.7 13.4 13.0  ?HCT 46.3 45.0 41.4 40.7 39.7  ?PLT 355 311 301 294 264  ?MCV 92.4 92.0 92.4 93.1 91.5  ?MCH 29.1 30.3 30.6 30.7 30.0  ?MCHC 31.5 32.9 33.1 32.9 32.7  ?RDW 16.6* 16.0* 16.1* 15.9* 15.4  ?LYMPHSABS 4.2*  --  2.9 4.9* 4.6*  ?MONOABS 1.7*  --  2.3* 2.6* 2.2*  ?EOSABS 0.2  --  0.0 0.0 0.1  ?BASOSABS 0.1  --  0.1 0.1 0.1  ? ? ?Electrolytes ?Recent Labs  ?Lab 10/02/21 ?1508 10/03/21 ?2112 10/03/21 ?2202 10/04/21 ?7517 10/04/21 ?1456 10/05/21 ?0040 10/05/21 ?0409 10/06/21 ?0119 10/07/21 ?0250  ?NA 143  --  140 139  --  136  --  139 140  ?K 4.1  --  4.2 4.4  --  3.7  --  3.6 3.5  ?CL 97*  --  91* 91*  --  95*  --  96* 101  ?CO2 32  --  33* 30  --  29  --  31 30  ?GLUCOSE 138*  --  121* 178*  --  139*  --  89 99  ?BUN 20  --  23* 27*  --  34*  --  29* 24*  ?CREATININE 1.08  --  1.32* 1.49*  --  1.09  --  0.96 0.93  ?CALCIUM 10.8*  --  11.5* 11.1*  --  10.8*  --  9.9 9.3  ?AST 17  --  21  --   --  28  --  25 19  ?ALT 15  --  18  --   --  20  --  20 20  ?ALKPHOS  --   --  31  --   --  50  --   48 44  ?BILITOT 0.8  --  1.1  --   --  1.3*  --  0.8 0.7  ?ALBUMIN  --   --  4.5  --   --  3.4*  --  3.4* 3.2*  ?MG 1.8  --   --   --   --  2.2  --  2.2 2.1  ?CRP  --   --   --  1.4*  --  1.1*  --   --   -

## 2021-10-07 NOTE — TOC Progression Note (Signed)
Transition of Care (TOC) - Progression Note  ? ? ?Patient Details  ?Name: Mark Harrell. ?MRN: [id number] ?Date of Birth: [birth year] ? ?Transition of Care (TOC) CM/SW Contact  ?Coralee Pesa, LCSWA ?Phone Number: ?10/07/2021, 3:10 PM ? ?Clinical Narrative:    ?CSW met with pt at bedside to discuss SNF bed offers. Pt has been faxed out extensively and has received two bed offers. Pt reviewed and noted that they had low ratings. Pt now stating he would rather return home. CSW notified medical team and CM. TOC will continue to follow for DC needs. ? ? ?Expected Discharge Plan: [place] ?Barriers to Discharge: Ship broker, Continued Medical Work up, SNF Pending bed offer ? ?Expected Discharge Plan and Services ?Expected Discharge Plan: [place] ?  ?  ?Post Acute Care Choice: [place] ?Living arrangements for the past 2 months: [place] ?                ?  ?  ?  ?  ?  ?  ?  ?  ?  ?  ? ? ?Social Determinants of Health (SDOH) Interventions ?  ? ?Readmission Risk Interventions ?   ? View : No data to display.  ?  ?  ?  ? ? ?

## 2021-10-07 NOTE — Progress Notes (Signed)
Kept taking out telemetry when going to the bathroom. Refused to placed back on  claiming that he keep taking it out and get tired doing it. Portable telemetry applied and explained to just carry when going to the bathroom.  Requested for telemetry pouch since he refused to carry it with him, large hosp. gown has no pocket on it. ?

## 2021-10-08 ENCOUNTER — Ambulatory Visit: Payer: BC Managed Care – PPO | Admitting: Adult Health

## 2021-10-08 DIAGNOSIS — J9621 Acute and chronic respiratory failure with hypoxia: Secondary | ICD-10-CM | POA: Diagnosis not present

## 2021-10-08 LAB — COMPREHENSIVE METABOLIC PANEL
ALT: 22 U/L (ref 0–44)
AST: 19 U/L (ref 15–41)
Albumin: 3.2 g/dL — ABNORMAL LOW (ref 3.5–5.0)
Alkaline Phosphatase: 48 U/L (ref 38–126)
Anion gap: 7 (ref 5–15)
BUN: 23 mg/dL — ABNORMAL HIGH (ref 6–20)
CO2: 31 mmol/L (ref 22–32)
Calcium: 9.1 mg/dL (ref 8.9–10.3)
Chloride: 102 mmol/L (ref 98–111)
Creatinine, Ser: 0.81 mg/dL (ref 0.61–1.24)
GFR, Estimated: >60 mL/min (ref >60)
Glucose, Bld: 109 mg/dL — ABNORMAL HIGH (ref 70–99)
Potassium: 3.6 mmol/L (ref 3.5–5.1)
Sodium: 140 mmol/L (ref 135–145)
Total Bilirubin: 1.1 mg/dL (ref 0.3–1.2)
Total Protein: 5.8 g/dL — ABNORMAL LOW (ref 6.5–8.1)

## 2021-10-08 LAB — GLUCOSE, CAPILLARY
Glucose-Capillary: 110 mg/dL — ABNORMAL HIGH (ref 70–99)
Glucose-Capillary: 147 mg/dL — ABNORMAL HIGH (ref 70–99)

## 2021-10-08 LAB — CBC WITH DIFFERENTIAL/PLATELET
Abs Immature Granulocytes: 0.13 10*3/uL — ABNORMAL HIGH (ref 0.00–0.07)
Basophils Absolute: 0.1 10*3/uL (ref 0.0–0.1)
Basophils Relative: 1 %
Eosinophils Absolute: 0.2 10*3/uL (ref 0.0–0.5)
Eosinophils Relative: 1 %
HCT: 41.3 % (ref 39.0–52.0)
Hemoglobin: 13.4 g/dL (ref 13.0–17.0)
Immature Granulocytes: 1 %
Lymphocytes Relative: 31 %
Lymphs Abs: 4.8 10*3/uL — ABNORMAL HIGH (ref 0.7–4.0)
MCH: 30 pg (ref 26.0–34.0)
MCHC: 32.4 g/dL (ref 30.0–36.0)
MCV: 92.4 fL (ref 80.0–100.0)
Monocytes Absolute: 2.1 10*3/uL — ABNORMAL HIGH (ref 0.1–1.0)
Monocytes Relative: 13 %
Neutro Abs: 8.4 10*3/uL — ABNORMAL HIGH (ref 1.7–7.7)
Neutrophils Relative %: 53 %
Platelets: 263 10*3/uL (ref 150–400)
RBC: 4.47 MIL/uL (ref 4.22–5.81)
RDW: 15.5 % (ref 11.5–15.5)
WBC: 15.7 10*3/uL — ABNORMAL HIGH (ref 4.0–10.5)
nRBC: 0 % (ref 0.0–0.2)

## 2021-10-08 LAB — MAGNESIUM: Magnesium: 2.3 mg/dL (ref 1.7–2.4)

## 2021-10-08 MED ORDER — APIXABAN 5 MG PO TABS: 5.0000 mg | ORAL_TABLET | Freq: Two times a day (BID) | ORAL | Status: DC

## 2021-10-08 MED ORDER — POTASSIUM CHLORIDE CRYS ER 20 MEQ PO TBCR
40.0000 meq | EXTENDED_RELEASE_TABLET | Freq: Two times a day (BID) | ORAL | Status: DC
  Administered 2021-10-08: 40 meq via ORAL
  Filled 2021-10-08: qty 2

## 2021-10-08 MED ORDER — METOPROLOL TARTRATE 25 MG PO TABS: 25.0000 mg | ORAL_TABLET | Freq: Two times a day (BID) | ORAL | 0 refills | Status: DC

## 2021-10-08 NOTE — Discharge Summary (Addendum)
?                                                                                ? ?Mark Harrell. YPP:[id number] DOB: [birth year] DOA: 10/03/2021 ? ?PCP: [name], MD ? ?Admit date: 10/03/2021  Discharge date: 10/08/2021 ? ?Admitted From: Home   Disposition:  Home ? ? ?Recommendations for Outpatient Follow-up:  ? ?Follow up with PCP in 1-2 weeks ? ?PCP Please obtain BMP/CBC, 2 view CXR in 1week,  (see Discharge instructions)  ? ?PCP Please follow up on the following pending results: Needs close outpatient follow-up with his pulmonologist, he has lung nodules on CT scan which need to be monitored by his pulmonary physician and PCP closely ? ? ?Home Health: PT, RN if qualifies   ?Equipment/Devices: None  ?Consultations: None  ?Discharge Condition: Stable   ?CODE STATUS: Full    ?Diet Recommendation: Heart Healthy with 1.5 L fluid restriction per day. ?  ? ?Chief Complaint  ?Patient presents with  ? Shortness of Breath  ?  ? ?Brief history of present illness from the day of admission and additional interim summary   ? ?61 y.o. male with medical history significant of morbid obesity, HTN, pre-diabetes.  COPD/ Asthma / OSA overlap syndrome, chronic hypoxic respiratory failure on 2 L nasal cannula oxygen, acquired secondary tracheomalacia per patient.  He is on Dupixent for his Asthma.  Pt had COVID PNA in Jan 2023, has had SOB and dyspnea ever since then. Pt diagnosed with BLL PNA on CT scan in early April 2023.  Failed outpt Ceftin, also failed outpt Levaquin.  Admitted to hospital 5/2-5/5, got ABx.  Also found to have DVT though no PE at that time, started on eliquis.  He now presents back after having difficulty to breathe, had to bump up his oxygen to 4 L, was feeling that his throat was closing up and he was having a tough time to take a deep breath.  He was diagnosed with acute on chronic hypoxic failure requiring 4 L  nasal cannula oxygen, left lower lobe pneumonia along with COPD exacerbation and admitted to the hospital ? ?                                                               Hospital Course  ? ?Acute on chronic hypoxic respiratory failure patient baseline at 2 L nasal cannula oxygen now on 4, asthma/COPD exacerbation, left lower lobe pneumonia along with history of acquired tracheomalacia. ?  ?He was placed on IV steroids and subsequently switched to oral steroids and tapered off, also received empiric antibiotics for COPD exacerbation, supplemental oxygen, with supportive care he is much better, all wheezing has resolved and he is at his baseline.  He did qualify to SNF but he refused to go.  Will be discharged home on home medications which are essentially unchanged.  I wonder if most of his symptoms were due to mild inflammation of his underlying tracheomalacia. ?  ?  ?  Acute deep vein thrombosis (DVT) of right lower extremity ([place]) - diagnosed recently on a previous hospital admission, continue with Eliquis. ?  ?Morbid obesity.  BMI of 51.  Follow with PCP for weight loss. ?  ?OSA/OHS.  Does not wear CPAP at home, continue nighttime oxygen, sleeps in a recliner. ?  ?AKI.  Resolved after hydration stable renal ultrasound.   ?  ?Chronic diastolic CHF EF over 47% on recent echocardiogram.  Continue diuretics at home dose and monitor  ?  ?Bilateral pulmonary nodules found on high-resolution CT upon admission.  Outpatient follow-up with his pulmonologist Mannam within a month for repeat imaging if needed and monitoring.   ?  ?Prediabetes - Hold metformin, ISS as on steroids ? ? ?Discharge diagnosis   ? ? ?Principal Problem: ?  Left lower lobe pneumonia ?Active Problems: ?  Asthma-COPD overlap syndrome ([place]) ?  Acute on chronic respiratory failure with hypoxia ([facility]) ?  Acute deep vein thrombosis (DVT) of right lower extremity ([place]) ?  Hypertension ?  Prediabetes ? ? ? ?Discharge instructions   ? ?Discharge Instructions    ? ? Diet - low sodium heart healthy   Complete by: As directed ?  ? Discharge instructions   Complete by: As directed ?  ? Follow with Primary MD [name], MD and your pulmonologist in 7 days  ? ?Get CBC, CMP, 2 view Chest X ray -  checked next visit within 1 week by Primary MD   ? ?Activity: As tolerated with Full fall precautions use walker/cane & assistance as needed ? ?Disposition Home ** ? ?Diet: Heart Healthy 1.5 L fluid restriction per day ? ?Special Instructions: If you have smoked or chewed Tobacco  in the last 2 yrs please stop smoking, stop any regular Alcohol  and or any Recreational drug use. ? ?On your next visit with your primary care physician please Get Medicines reviewed and adjusted. ? ?Please request your Prim.MD to go over all Hospital Tests and Procedure/Radiological results at the follow up, please get all Hospital records sent to your Prim MD by signing hospital release before you go home. ? ?If you experience worsening of your admission symptoms, develop shortness of breath, life threatening emergency, suicidal or homicidal thoughts you must seek medical attention immediately by calling 911 or calling your MD immediately  if symptoms less severe. ? ?You Must read complete instructions/literature along with all the possible adverse reactions/side effects for all the Medicines you take and that have been prescribed to you. Take any new Medicines after you have completely understood and accpet all the possible adverse reactions/side effects.  ? Increase activity slowly   Complete by: As directed ?  ? ?  ? ? ?Discharge Medications  ? ?Allergies as of 10/08/2021   ? ?   Reactions  ? Bee Venom Swelling  ? Cymbalta [duloxetine Hcl] Shortness Of Breath  ? Brought on asthma  ? Ppd [tuberculin Purified Protein Derivative] Other (See Comments)  ? +ppd NEG Quantferron Gold 3/13 (shows false positive)   ? Calan [verapamil] Other (See Comments)  ? Back pain  ? Tricor [fenofibrate] Other (See  Comments)  ? Back pain  ? Claritin [loratadine] Other (See Comments)  ? Unknown reaction   ? Levaquin [levofloxacin] Diarrhea  ? ?  ? ?  ?Medication List  ?  ? ?STOP taking these medications   ? ?bisoprolol-hydrochlorothiazide 5-6.25 MG tablet ?Commonly known as: ZIAC ?  ?tirzepatide 7.5 MG/0.5ML Pen ?Commonly known as: MOUNJARO ?  ? ?  ? ?  TAKE these medications   ? ?acetaminophen 500 MG tablet ?Commonly known as: TYLENOL ?Take 1,000 mg by mouth 3 (three) times daily. ?  ?anastrozole 1 MG tablet ?Commonly known as: ARIMIDEX ?Take 1 mg by mouth daily. ?  ?apixaban 5 MG Tabs tablet ?Commonly known as: ELIQUIS ?Take 1 tablet (5 mg total) by mouth 2 (two) times daily. ?  ?ascorbic acid 1000 MG tablet ?Commonly known as: VITAMIN C ?Take 1,000 mg by mouth every evening. ?  ?aspirin EC 81 MG tablet ?Take 81 mg by mouth daily. Swallow whole. ?  ?Azelastine HCl 137 MCG/SPRAY Soln ?Place 2 sprays into both nostrils 2 (two) times daily. USE 1 TO 2 SPRAYS EACH NOSTRIL 2 X /DAY ?What changed: additional instructions ?  ?benzonatate 200 MG capsule ?Commonly known as: TESSALON ?Take 1 capsule 3 x / day to Prevent Cough                                       /                           TAKE                              BY                 MOUTH ?What changed:  ?how much to take ?how to take this ?when to take this ?additional instructions ?  ?Botulinum Toxin Type A 200 units Solr ?Inject 200 Units into the skin every 3 (three) months. ?  ?budesonide 0.5 MG/2ML nebulizer solution ?Commonly known as: Pulmicort ?Take 2 mLs (0.5 mg total) by nebulization 3 (three) times daily as needed. ?What changed: when to take this ?  ?CALCIUM + VITAMIN D3 PO ?Take 1 tablet by mouth daily. ?  ?celecoxib 200 MG capsule ?Commonly known as: CELEBREX ?TAKE 1 CAPSULE (200 MG TOTAL) BY MOUTH EVERY 12 (TWELVE) HOURS. ?What changed: when to take this ?  ?cetaphil lotion ?Apply topically 2 (two) times daily. ?What changed: how much to take ?  ?cetirizine 10  MG tablet ?Commonly known as: ZYRTEC ?Take 1 tablet (10 mg total) by mouth 2 (two) times daily as needed for allergies (Can take an extra dose during flares). ?What changed: when to take this ?  ?Cinnamon 50

## 2021-10-08 NOTE — Consult Note (Addendum)
? ?  [facility] CM Inpatient Consult ? ? ?10/08/2021 ? ?Mark Harrell. ?28-Jul-1960 ?[id number] ? ?Follow up:  Referral - update ?**respiratory panel returned " Negative" for COVID  ? ?Met with patient at the bedside regarding update on referral request.  Patient states, he has decided to return home with home health. Patient states he will have what he needs at home with [facility] set up. ?Explained [facility] Care Coordination role for post hospital follow up.  Patient states, "that'll be helpful.  His best number is [phone], he states a voicemail can be left if he doesn't answer.  Reviewed SDOH for transportation and Food insecurity. Gave patient a 24 hour nurse line magnet and an appointment reminder card to follow up with PCP. ? ?Plan:  Will assign patient to [facility] for complex disease management. ? ?For questions, please contact: ? ?Natividad Brood, RN BSN CCM ?[facility] Liaison ? [phone] business mobile phone ?Toll free office [phone]  ?Fax number: [phone] ?[email or web address] ? ? ? ? ? ?

## 2021-10-08 NOTE — Progress Notes (Signed)
Physical Therapy Treatment ?Patient Details ?Name: Mark Harrell. ?MRN: [id number] ?DOB: May 25, 1961 ?Today's Date: 10/08/2021 ? ? ?History of Present Illness Patient is a 60 yo male admitted 10/03/21 with SOB and cough; workup for PNA, COPD exacerbation. Of note, recent ED admission 09/24/21 with similar symptoms. PMH includes morbid obesity, HTN, pre-diabetes, COPD/Asthma/OSA overlap syndrome, COVID-19. ?  ?PT Comments  ? ? Pt progressing well with mobility. Today's session focused on ambulation and therex for improving strength and activity tolerance; pt ambulatory with [facility] and supervision for safety/lines. Minimal DOE noted, pt endorses fatigue; SpO2 94-97% on 3L O2 with activity. Pt preparing for d/c home today; reports no further questions or concerns. If to remain admitted, will continue to follow acutely to address established goals.  ?   ?Recommendations for follow up therapy are one component of a multi-disciplinary discharge planning process, led by the attending physician.  Recommendations may be updated based on patient status, additional functional criteria and insurance authorization. ? ?Follow Up Recommendations ? Home health PT ?  ?  ?Assistance Recommended at Discharge Set up Supervision/Assistance  ?Patient can return home with the following A little help with bathing/dressing/bathroom;Assistance with cooking/housework ?  ?Equipment Recommendations ? Other ((bariatric shower chair)  ?  ?Recommendations for Other Services   ? ? ?  ?Precautions / Restrictions Precautions ?Precautions: Other (comment) ?Precaution Comments: watch SpO2 (3-3.5L baseline) ?Restrictions ?Weight Bearing Restrictions: No  ?  ? ?Mobility ? Bed Mobility ?  ?  ?  ?  ?  ?  ?  ?General bed mobility comments: received supine (flat) in recliner; reports, "I haven't slept in a bed in years" ?  ? ?Transfers ?Overall transfer level: Modified independent ?Equipment used: Straight cane ?Transfers: Sit to/from Stand ?  ?  ?  ?  ?  ?  ?  ?   ? ?Ambulation/Gait ?Ambulation/Gait assistance: Supervision ?Gait Distance (Feet): 220 Feet ?Assistive device: Straight cane ?Gait Pattern/deviations: Step-through pattern, Decreased stride length, Wide base of support, Trunk flexed ?Gait velocity: Decreased ?  ?  ?General Gait Details: Slow, steady gait with SPC and supervision for lines/O2 management; minimal DOE noted, SpO2 94-97% on 3L O2 [place] ? ? ?Stairs ?Stairs:  (pt declined stair training; reports no concern ascending/descending 2-3 steps into home with rail) ?  ?  ?  ?  ? ? ?Wheelchair Mobility ?  ? ?Modified Rankin (Stroke Patients Only) ?  ? ? ?  ?Balance Overall balance assessment: Mild deficits observed, not formally tested ?Sitting-balance support: No upper extremity supported, Feet supported ?Sitting balance-Leahy Scale: Good ?  ?  ?Standing balance support: No upper extremity supported, Single extremity supported, During functional activity ?Standing balance-Leahy Scale: Fair ?Standing balance comment: ambulatory without DME, preference for SPC ?  ?  ?  ?  ?  ?  ?High level balance activites: Side stepping, Direction changes, Turns, Sudden stops, Head turns ?High Level Balance Comments: no overt instability or LOB observed with higher level balance tasks using SPC ?  ?  ?  ?  ? ?  ?Cognition Arousal/Alertness: Awake/alert ?Behavior During Therapy: [facility] for tasks assessed/performed ?Overall Cognitive Status: Within Functional Limits for tasks assessed ?  ?  ?  ?  ?  ?  ?  ?  ?  ?  ?  ?  ?  ?  ?  ?  ?  ?  ?  ? ?  ?Exercises Other Exercises ?Other Exercises: 5x repeated sit<>stands (pt minimally requiring UE support; declines additional 5x secondary to  fatigue); also educ on partial squats and calf raises with UE support ? ?  ?General Comments General comments (skin integrity, edema, etc.): pt declines use of rollator for hallway ambulation, preference for SPC. pt preparing for d/c home this afternoon; educ re: activity recommendations (inlcuding walking  program, therex), importance of mobility ?  ?  ? ?Pertinent Vitals/Pain Pain Assessment ?Pain Assessment: No/denies pain ?Pain Intervention(s): Monitored during session  ? ? ?Home Living   ?  ?  ?  ?  ?  ?  ?  ?  ?  ?   ?  ?Prior Function    ?  ?  ?   ? ?PT Goals (current goals can now be found in the care plan section) Acute Rehab PT Goals ?Patient Stated Goal: home today ?Progress towards PT goals: Progressing toward goals ? ?  ?Frequency ? ? ? Min 2X/week ? ? ? ?  ?PT Plan Discharge plan needs to be updated  ? ? ?Co-evaluation   ?  ?  ?  ?  ? ?  ?AM-PAC PT "6 Clicks" Mobility   ?Outcome Measure ? Help needed turning from your back to your side while in a flat bed without using bedrails?: A Little ?Help needed moving from lying on your back to sitting on the side of a flat bed without using bedrails?: A Little ?Help needed moving to and from a bed to a chair (including a wheelchair)?: A Little ?Help needed standing up from a chair using your arms (e.g., wheelchair or bedside chair)?: A Little ?Help needed to walk in hospital room?: A Little ?Help needed climbing 3-5 steps with a railing? : A Little ?6 Click Score: 18 ? ?  ?End of Session Equipment Utilized During Treatment: Oxygen ?Activity Tolerance: Patient tolerated treatment well ?Patient left: in chair;with call bell/phone within reach ?Nurse Communication: Mobility status ?PT Visit Diagnosis: Unsteadiness on feet (R26.81);Other abnormalities of gait and mobility (R26.89);Muscle weakness (generalized) (M62.81);Difficulty in walking, not elsewhere classified (R26.2) ?  ? ? ?Time: 9150-5697 ?PT Time Calculation (min) (ACUTE ONLY): 18 min ? ?Charges:  $Therapeutic Exercise: 8-22 mins          ?          ? ?Mabeline Caras, PT, DPT ?Acute Rehabilitation Services  ?Pager [phone] ?Office [phone] ? ?Derry Lory ?10/08/2021, 11:18 AM ? ?

## 2021-10-08 NOTE — Progress Notes (Signed)
Discharged home accompanied by uncle. Belongings  taken home. ?

## 2021-10-08 NOTE — Discharge Instructions (Signed)
Follow with Primary MD [name], MD and your pulmonologist in 7 days  ? ?Get CBC, CMP, 2 view Chest X ray -  checked next visit within 1 week by Primary MD   ? ?Activity: As tolerated with Full fall precautions use walker/cane & assistance as needed ? ?Disposition Home ** ? ?Diet: Heart Healthy 1.5 L fluid restriction per day ? ?Special Instructions: If you have smoked or chewed Tobacco  in the last 2 yrs please stop smoking, stop any regular Alcohol  and or any Recreational drug use. ? ?On your next visit with your primary care physician please Get Medicines reviewed and adjusted. ? ?Please request your Prim.MD to go over all Hospital Tests and Procedure/Radiological results at the follow up, please get all Hospital records sent to your Prim MD by signing hospital release before you go home. ? ?If you experience worsening of your admission symptoms, develop shortness of breath, life threatening emergency, suicidal or homicidal thoughts you must seek medical attention immediately by calling 911 or calling your MD immediately  if symptoms less severe. ? ?You Must read complete instructions/literature along with all the possible adverse reactions/side effects for all the Medicines you take and that have been prescribed to you. Take any new Medicines after you have completely understood and accpet all the possible adverse reactions/side effects.  ? ?  ?

## 2021-10-08 NOTE — TOC Progression Note (Addendum)
Transition of Care (TOC) - Progression Note  ? ? ?Patient Details  ?Name: Mark Harrell. ?MRN: [id number] ?Date of Birth: [birth year] ? ?Transition of Care (TOC) CM/SW Contact  ?Angelita Ingles, RN ?Phone Number:[phone] ? ?10/08/2021, 8:44 AM ? ?Clinical Narrative:    ?CM at bedside to discuss disposition with patient. Patient confirms that he has refused short term rehab at this time and would prefer to go home. CM offered  patient choice for home health services. Patient states that he has no preference as long as his insurance will cover. Home health referral called to [facility] with [place]. Acceptance pending.  ? ?[facility] unable to accept referral out of network. [facility] unable to accept [employer] home health referral. [facility] unable to accept [facility] referral.  Referral has been sent to [facility] with [place]. Acceptance pending ? ?[facility] unable to accept [place] home health referral ? ?[address] Referral has been sent to [facility] with [facility] acceptance pending.  ? ?[place] referral has been accepted by [place] with [facility]. AVS updated.  ? ?Expected Discharge Plan: [place] ?Barriers to Discharge: Ship broker, Continued Medical Work up, SNF Pending bed offer ? ?Expected Discharge Plan and Services ?Expected Discharge Plan: [place] ?  ?  ?Post Acute Care Choice: [place] ?Living arrangements for the past 2 months: [place] ?                ?  ?  ?  ?  ?  ?  ?  ?  ?  ?  ? ? ?Social Determinants of Health (SDOH) Interventions ?  ? ?Readmission Risk Interventions ?   ? View : No data to display.  ?  ?  ?  ? ? ?

## 2021-10-14 DIAGNOSIS — J449 Chronic obstructive pulmonary disease, unspecified: Secondary | ICD-10-CM | POA: Diagnosis not present

## 2021-10-14 DIAGNOSIS — J9611 Chronic respiratory failure with hypoxia: Secondary | ICD-10-CM | POA: Diagnosis not present

## 2021-10-16 ENCOUNTER — Ambulatory Visit: Payer: BC Managed Care – PPO | Admitting: Nurse Practitioner

## 2021-10-16 ENCOUNTER — Encounter: Payer: Self-pay | Admitting: Nurse Practitioner

## 2021-10-16 ENCOUNTER — Ambulatory Visit
Admission: RE | Admit: 2021-10-16 | Discharge: 2021-10-16 | Disposition: A | Discharge status: Home / Self Care | Payer: BC Managed Care – PPO | Source: Ambulatory Visit | Attending: Nurse Practitioner | Admitting: Nurse Practitioner

## 2021-10-16 VITALS — BP 130/90 | HR 85 | Temp 97.3°F | Wt 365.0 lb

## 2021-10-16 DIAGNOSIS — D72829 Elevated white blood cell count, unspecified: Secondary | ICD-10-CM

## 2021-10-16 DIAGNOSIS — J9611 Chronic respiratory failure with hypoxia: Secondary | ICD-10-CM | POA: Diagnosis not present

## 2021-10-16 DIAGNOSIS — I82461 Acute embolism and thrombosis of right calf muscular vein: Secondary | ICD-10-CM

## 2021-10-16 DIAGNOSIS — R0602 Shortness of breath: Secondary | ICD-10-CM | POA: Diagnosis not present

## 2021-10-16 DIAGNOSIS — Z09 Encounter for follow-up examination after completed treatment for conditions other than malignant neoplasm: Secondary | ICD-10-CM

## 2021-10-16 DIAGNOSIS — Z79899 Other long term (current) drug therapy: Secondary | ICD-10-CM

## 2021-10-16 DIAGNOSIS — R609 Edema, unspecified: Secondary | ICD-10-CM | POA: Diagnosis not present

## 2021-10-16 DIAGNOSIS — R911 Solitary pulmonary nodule: Secondary | ICD-10-CM | POA: Diagnosis not present

## 2021-10-16 NOTE — Patient Instructions (Signed)
Oxygen Use at Home ?This video will teach you about important safety precautions when using oxygen at home and in the community. ?To view the content, go to this web address: ?[email or web address] ? ?This video will expire on: 08/13/2023. If you need access to this video following this date, please reach out to the healthcare provider who assigned it to you. ?This information is not intended to replace advice given to you by your health care provider. Make sure you discuss any questions you have with your health care provider. ?Elsevier Patient Education ? 2023 [employer] ? ?

## 2021-10-16 NOTE — Progress Notes (Unsigned)
Hospital follow up  1. Hospital discharge follow-up Left Lower Lobe PNA Resolving Continue to monitor.   2. Chronic respiratory failure with hypoxia (HCC) Improved. Continue referral to Pulmonology for Lung nodules noticed on CT Scan Continue Ventolin, Pulmicort, Duoneb nebulizer PRN. Continue Montelukast. Does not wear CPAP at home, continue nighttime oxygen, sleeps in a recliner. Report to ER or call 911 for any increase in difficulty breathing.   - DG Chest 2 View; Future  3. Lung nodule Continue Pulmonology referral. Follow up with Parrett, Fonnie Mu, NP (Pulmonary Disease) on 10/08/2021; Follow wit Tammy Parrett NP 10-08-21 3:00 pm  - DG Chest 2 View; Future  4. Leukocytosis, unspecified type  - CBC with Differential/Platelet  5. Edema, unspecified type Schedule an appointment with Lorretta Harp, MD (Cardiology) in 2 weeks (10/22/2021) Continue Lasix. Discussed Heart Healthy Diet. 1.5 L fluid restriction per day. Avoid salt. Elevated BLE. Continue PT  - COMPLETE METABOLIC PANEL WITH GFR  6. Medication management All medications discussed and reviewed in full. All questions and concerns regarding medications addressed.    7.  Acute deep vein thrombosis (DVT) of right lower extremity ([place])  Diagnosed recently on a previous hospital admission, continue with Eliquis.  Continue Home Health. Follow up with Golda Acre, [facility] (Section); Your [facility] has [facility] with [facility]. The office will call with start of care infromation.     All medications were reviewed with patient and family and fully reconciled. All questions answered fully, and patient and family members were encouraged to call the office with any further questions or concerns. Discussed goal to avoid readmission related to this diagnosis.   Discontinued medications include Ziac and Wegovy.  Over 40 minutes of exam, counseling, chart review, and complex,  high/moderate level critical decision making was performed this visit.   Future Appointments  Date Time Provider [place]  11/12/2021  3:45 PM [name], MD [name]  11/13/2021  3:30 PM Liane Comber, NP [name]  11/14/2021 10:15 AM Marshell Garfinkel, MD [facility] None  02/18/2022  3:00 PM [name], MD [name]     HPI  61 y.o.male presents for follow up for transition from recent hospitalization or SNIF stay. Admit date to the hospital was 10/03/21, patient was discharged from the hospital on 10/08/21 and our clinical staff contacted the office the day after discharge to set up a follow up appointment. The discharge summary, medications, and diagnostic test results were reviewed before meeting with the patient. The patient was admitted for:   61 y.o. male with medical history significant of morbid obesity, HTN, pre-diabetes.  COPD/ Asthma / OSA overlap syndrome, chronic hypoxic respiratory failure on 2 L nasal cannula oxygen, acquired secondary tracheomalacia per patient.  He is on Dupixent for his Asthma.  Pt had COVID PNA in Jan 2023, has had SOB and dyspnea ever since then. Pt diagnosed with BLL PNA on CT scan in early April 2023.  Failed outpt Ceftin, also failed outpt Levaquin.  Admitted to hospital 5/2-5/5, got ABx.  Also found to have DVT though no PE at that time, started on eliquis.  He now presents back after having difficulty to breathe, had to bump up his oxygen to 4 L, was feeling that his throat was closing up and he was having a tough time to take a deep breath.  He was diagnosed with acute on chronic hypoxic failure requiring 4 L nasal cannula oxygen, left lower lobe  pneumonia along with COPD exacerbation and admitted to the hospital  Acute on chronic hypoxic respiratory failure patient baseline at 2 L nasal cannula oxygen now on 4, asthma/COPD exacerbation, left lower lobe pneumonia along with history of acquired tracheomalacia.   He  was placed on IV steroids and subsequently switched to oral steroids and tapered off, also received empiric antibiotics for COPD exacerbation, supplemental oxygen.  Supportive care was continued and as he felt  much better, including all wheezing that had resolved and he was back at his baseline, he was discharged.  He did qualify to SNF but he refused to go.  He was discharged with Home Health.  Medications were essentially unchanged.    CT during last admit showed: IMPRESSION: Moderate severity bilateral lower lobe airspace disease, mildly increased in severity when compared to the prior study. Prior study was April 11. There was a questionable  Cryptogenic organizing pneumonia, postinflammatory pulm fibrosis, or other unusual pulmonary syndrome, especially given h/o "long COVID" and ongoing persistent hypoxia post COVID in Jan 2023.  He is set to follow up with Pulmonology for further evaluation.  Home health is involved.   Images while in the hospital: US RENAL  Result Date: 10/04/2021 CLINICAL DATA:  Acute renal insufficiency. EXAM: RENAL / URINARY TRACT ULTRASOUND COMPLETE COMPARISON:  CT scan from 2018 FINDINGS: Right Kidney: Renal measurements: 11.6 x 6.8 x 7.1 cm = volume: 253 mL. Limited visualization of the kidney due to body habitus. Mild renal cortical thinning for age. Grossly normal echogenicity and no obvious renal lesions or hydronephrosis. Left Kidney: Renal measurements: 12.0 x 6.6 x 6.0 cm = volume: 250.2 mL. Limited visualization of the kidney due to body habitus. Mild renal cortical thinning for age. Grossly normal echogenicity and no obvious renal lesions or hydronephrosis. Bladder: Appears normal for degree of bladder distention. Other: None. IMPRESSION: 1. Limited ultrasound examination due to body habitus. 2. Mild renal cortical thinning suggested. No renal lesions or hydronephrosis. Electronically Signed   By: Marijo Sanes M.D.   On: 10/04/2021 11:48   CT Chest High  Resolution  Result Date: 10/04/2021 CLINICAL DATA:  Persistent opacity EXAM: CT CHEST WITHOUT CONTRAST TECHNIQUE: Multidetector CT imaging of the chest was performed following the standard protocol without intravenous contrast. High resolution imaging of the lungs, as well as inspiratory and expiratory imaging, was performed. RADIATION DOSE REDUCTION: This exam was performed according to the departmental dose-optimization program which includes automated exposure control, adjustment of the mA and/or kV according to patient size and/or use of iterative reconstruction technique. COMPARISON:  Chest CT dated May 3rd 2023; Chest CT dated September 03, 2021 FINDINGS: Cardiovascular: Normal heart size. No pericardial effusion. Normal caliber thoracic aorta with no atherosclerotic disease. Dilated main pulmonary artery, measuring up to 4.3 cm. Mediastinum/Nodes: Esophagus and thyroid are unremarkable. Prominent subcentimeter mediastinal lymph nodes, decreased in size when compared with most recent prior CT and likely reactive. Reference periaortic lymph node measuring 9 mm on series 5, image 84, previously 15 mm. Lungs/Pleura: Central airways are patent. Bilateral solid pulmonary nodules and peripheral masslike opacities which are most pronounced in the lower lungs. Several of the nodules/masses have increased size when compared with prior exam. Reference masslike opacity located in the right lower lobe and measures 3.5 x 2.4 cm on series 6, image 125, previously measured 2.0 x 2.2 cm. Upper Abdomen: No acute abnormality. Musculoskeletal: No chest wall mass or suspicious bone lesions identified. IMPRESSION: 1. Bilateral solid pulmonary nodules and peripheral masslike opacities which  are most pronounced in the lower lungs, several have increased in size when compared with September 03, 2021 prior exam. Differential considerations include inflammatory etiologies such as organizing pneumonia, atypical infection such as fungal  pneumonia, or neoplasm. Findings are atypical for post COVID fibrosis. Recommend short-term follow-up in 2-3 months to ensure resolution. 2. Dilated main pulmonary artery, findings can be seen the setting of pulmonary hypertension. Electronically Signed   By: Yetta Glassman M.D.   On: 10/04/2021 09:45   DG Chest Port 1 View  Result Date: 10/04/2021 CLINICAL DATA:  Shortness of breath. EXAM: PORTABLE CHEST 1 VIEW COMPARISON:  10/03/2021 FINDINGS: Stable cardiomediastinal contours. New bibasilar opacities which are favored to represent areas of atelectasis and or recurrent airspace disease. No signs of pleural effusion or edema. Visualized osseous structures appear intact. IMPRESSION: New bibasilar opacities which are favored to represent areas of atelectasis and/or recurrent airspace disease. Electronically Signed   By: Kerby Moors M.D.   On: 10/04/2021 07:05   DG Chest Portable 1 View  Result Date: 10/03/2021 CLINICAL DATA:  Shortness of breath EXAM: PORTABLE CHEST 1 VIEW COMPARISON:  09/24/2021 FINDINGS: Cardiomegaly. Mild vascular congestion. Continued left retrocardiac opacity, improved since prior study. No focal opacity on the right. No effusions or acute bony abnormality. IMPRESSION: Cardiomegaly, vascular congestion. Left lower lobe airspace disease persists but is improved since prior study. Electronically Signed   By: Rolm Baptise M.D.   On: 10/03/2021 22:08     Current Outpatient Medications (Endocrine & Metabolic):    metFORMIN (GLUCOPHAGE-XR) 500 MG 24 hr tablet, Takes 2 tablets  2 x /day  with Meals  for Diabetes (Patient taking differently: Take 1,000 mg by mouth 2 (two) times daily.)  Current Outpatient Medications (Cardiovascular):    EPINEPHrine 0.3 mg/0.3 mL IJ SOAJ injection, Inject 0.3 mg into the muscle as needed. (Patient taking differently: Inject 0.3 mg into the muscle as needed for anaphylaxis.)   furosemide (LASIX) 80 MG tablet, Take 80 mg by mouth 2 (two) times  daily.   metoprolol tartrate (LOPRESSOR) 25 MG tablet, Take 1 tablet (25 mg total) by mouth 2 (two) times daily.   pravastatin (PRAVACHOL) 40 MG tablet, TAKE 1 TABLET BY MOUTH AT BEDTIME FOR CHOLESTEROL (Patient taking differently: Take 40 mg by mouth daily.)   tadalafil (CIALIS) 5 MG tablet, Take 5 mg by mouth daily.   Current Outpatient Medications (Respiratory):    Azelastine HCl 137 MCG/SPRAY SOLN, Place 2 sprays into both nostrils 2 (two) times daily. USE 1 TO 2 SPRAYS EACH NOSTRIL 2 X /DAY (Patient taking differently: Place 2 sprays into both nostrils 2 (two) times daily.)   benzonatate (TESSALON) 200 MG capsule, Take 1 capsule 3 x / day to Prevent Cough                                       /                           TAKE                              BY                 MOUTH   budesonide (PULMICORT) 0.5 MG/2ML nebulizer solution, Take 2 mLs (0.5 mg total) by nebulization 3 (three)  times daily as needed. (Patient taking differently: Take 0.5 mg by nebulization 2 (two) times daily.)   cetirizine (ZYRTEC) 10 MG tablet, Take 1 tablet (10 mg total) by mouth 2 (two) times daily as needed for allergies (Can take an extra dose during flares). (Patient taking differently: Take 10 mg by mouth in the morning.)   DM-APAP-CPM (CORICIDIN HBP MAX STRENGTH FLU PO), Take 1 tablet by mouth 3 (three) times daily.   fluticasone (FLONASE) 50 MCG/ACT nasal spray, Place 2 sprays into both nostrils daily. Use 1 to 2 sprays each Nostril 2 x /day (Patient taking differently: Place 2 sprays into both nostrils in the morning and at bedtime.)   Fluticasone-Umeclidin-Vilant (TRELEGY ELLIPTA) 100-62.5-25 MCG/ACT AEPB, Inhale 1 puff into the lungs daily.   Guaifenesin 1200 MG TB12, Take 1,200 mg by mouth 3 (three) times daily.   ipratropium-albuterol (DUONEB) 0.5-2.5 (3) MG/3ML SOLN, TAKE 3 MLS BY NEBULIZATION EVERY 4 (FOUR) HOURS AS NEEDED. (Patient taking differently: Take 6 mLs by nebulization every 4 (four) hours as  needed (shortess of breath).)   montelukast (SINGULAIR) 10 MG tablet, TAKE 1 TABLET BY MOUTH DAILY FOR ALLERGIES (Patient taking differently: Take 10 mg by mouth at bedtime.)   oxymetazoline (AFRIN) 0.05 % nasal spray, Place 1 spray into both nostrils at bedtime.   promethazine-dextromethorphan (PROMETHAZINE-DM) 6.25-15 MG/5ML syrup, Take  1 teaspoon (5 ml)  every 4 hours  as needed  for Cough                                   /                              TAKE            BY                MOUTH (Patient taking differently: Take 5 mLs by mouth 4 (four) times daily as needed for cough.)   sodium chloride (OCEAN) 0.65 % SOLN nasal spray, Place 1 spray into both nostrils 4 (four) times daily as needed for congestion. Uses each time before other nasal sprays   VENTOLIN HFA 108 (90 Base) MCG/ACT inhaler, INHALE 2 PUFFS BY MOUTH EVERY 4 HOURS AS NEEDED FOR WHEEZE OR FOR SHORTNESS OF BREATH (Patient taking differently: Inhale 2 puffs into the lungs every 4 (four) hours as needed for wheezing or shortness of breath.)  Current Outpatient Medications (Analgesics):    acetaminophen (TYLENOL) 500 MG tablet, Take 1,000 mg by mouth 3 (three) times daily.   aspirin EC 81 MG tablet, Take 81 mg by mouth daily. Swallow whole.   celecoxib (CELEBREX) 200 MG capsule, TAKE 1 CAPSULE (200 MG TOTAL) BY MOUTH EVERY 12 (TWELVE) HOURS. (Patient taking differently: Take 200 mg by mouth 2 (two) times daily.)  Current Outpatient Medications (Hematological):    apixaban (ELIQUIS) 5 MG TABS tablet, Take 1 tablet (5 mg total) by mouth 2 (two) times daily.  Current Outpatient Medications (Other):    anastrozole (ARIMIDEX) 1 MG tablet, Take 1 mg by mouth daily.   ascorbic acid (VITAMIN C) 1000 MG tablet, Take 1,000 mg by mouth every evening.   Botulinum Toxin Type A 200 units SOLR, Inject 200 Units into the skin every 3 (three) months.   Calcium Carb-Cholecalciferol (CALCIUM + VITAMIN D3 PO), Take 1 tablet by mouth daily.    cetaphil (  CETAPHIL) lotion, Apply topically 2 (two) times daily. (Patient taking differently: Apply 1 application. topically 2 (two) times daily.)   Cholecalciferol (VITAMIN D3) 125 MCG (5000 UT) TABS, Take 10,000 Units by mouth 2 (two) times daily. Morning and evening   Cinnamon 500 MG capsule, Take 1,000 mg by mouth 3 (three) times daily.   Coenzyme Q10 (CO Q 10 PO), Take 600 mg by mouth daily.   cyclobenzaprine (FLEXERIL) 10 MG tablet, TAKE 1/2 TO 1 TABLET 3 X DAY ONLY IF NEED FOR MUSCLE SPASM (NECK) (Patient taking differently: Take 10 mg by mouth 3 (three) times daily.)   diclofenac Sodium (VOLTAREN) 1 % GEL, APPLY 2 TO 4 GRAMS TOPICALLY 2 TO 4 TIMES DAILY FOR PAIN & INFLAMMATION( OTC NOT COVERED) (Patient taking differently: 2-4 g 2 (two) times daily as needed (pain).)   donepezil (ARICEPT) 23 MG TABS tablet, Take 23 mg by mouth daily.   doxycycline (ADOXA) 100 MG tablet, Take 100 mg by mouth 2 (two) times daily. prophylactic   dupilumab (DUPIXENT) 300 MG/2ML prefilled syringe, Inject 300 mg into the skin every 14 (fourteen) days.   Krill Oil 500 MG CAPS, Take 500 mg by mouth 2 (two) times daily.   Magnesium 500 MG CAPS, Take 500 mg by mouth 3 (three) times daily.   memantine (NAMENDA) 10 MG tablet, Take 10 mg by mouth 2 (two) times daily.   Misc Natural Products (GLUCOS-CHONDROIT-MSM COMPLEX) TABS, Take 2 tablets by mouth 3 (three) times daily.   Multiple Vitamins-Minerals (MULTIVITAMIN WITH MINERALS) tablet, Take 1 tablet by mouth daily.   Multiple Vitamins-Minerals (PRESERVISION AREDS 2) CAPS, Take 1 capsule by mouth 2 (two) times daily.   pentosan polysulfate (ELMIRON) 100 MG capsule, Take 100 mg by mouth 3 (three) times daily.   potassium chloride SA (KLOR-CON M) 20 MEQ tablet, Take 2 tablets (40 mEq total) by mouth 2 (two) times daily.   pregabalin (LYRICA) 150 MG capsule, Take  1 capsule  3 x /day for Chronic Pain                                          /                          TAKE                               BY               MOUTH (Patient taking differently: Take 150 mg by mouth 3 (three) times daily.)   PROBIOTIC PRODUCT PO, Take 1 capsule by mouth 2 (two) times daily. VSL   sertraline (ZOLOFT) 100 MG tablet, Take 100 mg by mouth 2 (two) times daily.   trospium (SANCTURA) 20 MG tablet, Take 20 mg by mouth 2 (two) times daily. In the evening and at bedtime   TURMERIC PO, Take 2 tablets by mouth 3 (three) times daily.   zinc gluconate 50 MG tablet, Take 50 mg by mouth 3 (three) times daily.  Past Medical History:  Diagnosis Date   Anesthesia complication requiring reversal agent administration    ? from central apnea, very difficult to get off vent   Anxiety    Arthritis    osteo   Asthma    BPH (benign prostatic hyperplasia)  Complication of anesthesia    difficulty waking , they twlight me because of my respiratory problems "   Depression    Dyspnea    on exertion   Enlarged heart    Family history of adverse reaction to anesthesia    mother trouble waking up, and heart stopped   GERD (gastroesophageal reflux disease)    Headache    botox injections for headaches   Hyperlipidemia    Hypertension    Hypogonadism male    IBS (irritable bowel syndrome)    Memory difficulties    short term memories   Neuropathy    Obesity    On home oxygen therapy    on 2 liter   OSA (obstructive sleep apnea)    not using cpap   Paralysis (HCC)    left hand - small   Pneumonia    Pre-diabetes    Prostatitis      Allergies  Allergen Reactions   Bee Venom Swelling   Cymbalta [Duloxetine Hcl] Shortness Of Breath    Brought on asthma   Ppd [Tuberculin Purified Protein Derivative] Other (See Comments)    +ppd NEG Quantferron Gold 3/13 (shows false positive)    Calan [Verapamil] Other (See Comments)    Back pain   Tricor [Fenofibrate] Other (See Comments)    Back pain   Claritin [Loratadine] Other (See Comments)    Unknown reaction    Levaquin  [Levofloxacin] Diarrhea    ROS: all negative except above.   Physical Exam: Filed Weights   10/16/21 1403  Weight: (!) 365 lb (165.6 kg)   BP 130/90   Pulse 85   Temp (!) 97.3 F (36.3 C)   Wt (!) 365 lb (165.6 kg)   SpO2 91%   BMI 49.50 kg/m  General Appearance: Well nourished, in no apparent distress. Eyes: PERRLA, EOMs, conjunctiva no swelling or erythema Sinuses: No Frontal/maxillary tenderness ENT/Mouth: Ext aud canals clear, TMs without erythema, bulging. No erythema, swelling, or exudate on post pharynx.  Tonsils not swollen or erythematous. Hearing normal.  Neck: Supple, thyroid normal.  Respiratory: +Cough.  Respiratory effort normal, BS equal bilaterally without rales, rhonchi, wheezing or stridor.  Cardio: RRR with no MRGs. Brisk peripheral pulses without edema.  Abdomen: Soft, + BS.  Non tender, no guarding, rebound, hernias, masses. Lymphatics: Non tender without lymphadenopathy.  Musculoskeletal: Full ROM, 5/5 strength, normal gait.  Skin: Warm, dry without rashes, lesions, ecchymosis.  Neuro: Cranial nerves intact. Normal muscle tone, no cerebellar symptoms. Sensation intact.  Psych: Awake and oriented X 3, normal affect, Insight and Judgment appropriate.     Darrol Jump, NP 10:42 PM [facility]

## 2021-10-17 ENCOUNTER — Ambulatory Visit (INDEPENDENT_AMBULATORY_CARE_PROVIDER_SITE_OTHER): Payer: BC Managed Care – PPO | Admitting: Cardiovascular Disease

## 2021-10-17 ENCOUNTER — Encounter: Payer: Self-pay | Admitting: Cardiovascular Disease

## 2021-10-17 DIAGNOSIS — E782 Mixed hyperlipidemia: Secondary | ICD-10-CM | POA: Diagnosis not present

## 2021-10-17 DIAGNOSIS — I10 Essential (primary) hypertension: Secondary | ICD-10-CM

## 2021-10-17 DIAGNOSIS — J449 Chronic obstructive pulmonary disease, unspecified: Secondary | ICD-10-CM

## 2021-10-17 DIAGNOSIS — G4733 Obstructive sleep apnea (adult) (pediatric): Secondary | ICD-10-CM | POA: Diagnosis not present

## 2021-10-17 DIAGNOSIS — I82461 Acute embolism and thrombosis of right calf muscular vein: Secondary | ICD-10-CM

## 2021-10-17 LAB — COMPLETE METABOLIC PANEL WITH GFR
AG Ratio: 2 (calc) (ref 1.0–2.5)
ALT: 14 U/L (ref 9–46)
AST: 16 U/L (ref 10–35)
Albumin: 4 g/dL (ref 3.6–5.1)
Alkaline phosphatase (APISO): 58 U/L (ref 35–144)
BUN: 18 mg/dL (ref 7–25)
CO2: 28 mmol/L (ref 20–32)
Calcium: 9.8 mg/dL (ref 8.6–10.3)
Chloride: 105 mmol/L (ref 98–110)
Creat: 0.85 mg/dL (ref 0.70–1.35)
Globulin: 2 g/dL (calc) (ref 1.9–3.7)
Glucose, Bld: 92 mg/dL (ref 65–99)
Potassium: 4.5 mmol/L (ref 3.5–5.3)
Sodium: 144 mmol/L (ref 135–146)
Total Bilirubin: 1 mg/dL (ref 0.2–1.2)
Total Protein: 6 g/dL — ABNORMAL LOW (ref 6.1–8.1)
eGFR: 99 mL/min/{1.73_m2} (ref >=60)

## 2021-10-17 LAB — CBC WITH DIFFERENTIAL/PLATELET
Absolute Monocytes: 1076 cells/uL — ABNORMAL HIGH (ref 200–950)
Basophils Absolute: 83 cells/uL (ref 0–200)
Basophils Relative: 0.9 %
Eosinophils Absolute: 294 cells/uL (ref 15–500)
Eosinophils Relative: 3.2 %
HCT: 42 % (ref 38.5–50.0)
Hemoglobin: 14.1 g/dL (ref 13.2–17.1)
Lymphs Abs: 2594 cells/uL (ref 850–3900)
MCH: 30.7 pg (ref 27.0–33.0)
MCHC: 33.6 g/dL (ref 32.0–36.0)
MCV: 91.3 fL (ref 80.0–100.0)
MPV: 11.2 fL (ref 7.5–12.5)
Monocytes Relative: 11.7 %
Neutro Abs: 5152 cells/uL (ref 1500–7800)
Neutrophils Relative %: 56 %
Platelets: 234 10*3/uL (ref 140–400)
RBC: 4.6 10*6/uL (ref 4.20–5.80)
RDW: 14.6 % (ref 11.0–15.0)
Total Lymphocyte: 28.2 %
WBC: 9.2 10*3/uL (ref 3.8–10.8)

## 2021-10-17 NOTE — Assessment & Plan Note (Signed)
History of right lower extremity DVT on Eliquis oral anticoagulation.

## 2021-10-17 NOTE — Assessment & Plan Note (Signed)
History of obstructive sleep apnea intolerant to CPAP 

## 2021-10-17 NOTE — Assessment & Plan Note (Signed)
History of essential hypertension a blood pressure measured today 160/88.  He is on metoprolol.

## 2021-10-17 NOTE — Patient Instructions (Signed)

## 2021-10-17 NOTE — Progress Notes (Signed)
10/17/2021 Mark March Jr.   1960/10/24  [id number]  Primary Physician [name], MD Primary Cardiologist: Lorretta Harp MD Garret Reddish, [place], [place]  HPI:  Mark Harrell. is a 61 y.o.  morbidly overweight single Caucasian male who children referred by Dr. Melford Aase for cardiovascular evaluation because of hypertension, cardiac enlargement and lower extremity edema.  I last saw him in the office 02/02/2019.  He has no prior cardiac history. His risk factors include treated hypertension and hyperlipidemia. There is no family history. He's never had a heart attack or stroke. He denies chest pain but does get dyspneic on exertion probably related to his body habitus and deconditioning. He also has chronic mild lower extremity edema probably related to his obesity as well. He's been told that he has cardiac enlargement in the past. A 2D echocardiogram performed 02/23/2017 revealed normal LV size and function.  He does have obstructive sleep apnea currently not on CPAP.  He is also had an episode of chest pain which resulted in ER eval with negative troponins recently and has had several less intense episodes since that time which has been attributed to esophageal spasm or GERD.  Unfortunately, his body size and habitus preclude noninvasive evaluation.   Since I saw him in the office 3 years ago he has remained stable.  He has gained 30 pounds since I last saw him.  He was recently hospitalized 2 weeks ago with respiratory insufficiency, pneumonia and DVT.  Recent CT scan showed bilateral lateral pulmonary nodules of unknown etiology.  Interestingly, there is no atherosclerosis noted.  He denies chest pain.  He does have diastolic heart failure with lower extremity edema on furosemide.   Current Meds  Medication Sig   acetaminophen (TYLENOL) 500 MG tablet Take 1,000 mg by mouth 3 (three) times daily.   anastrozole (ARIMIDEX) 1 MG tablet Take 1 mg by mouth daily.   apixaban (ELIQUIS) 5 MG TABS  tablet Take 1 tablet (5 mg total) by mouth 2 (two) times daily.   ascorbic acid (VITAMIN C) 1000 MG tablet Take 1,000 mg by mouth every evening.   aspirin EC 81 MG tablet Take 81 mg by mouth daily. Swallow whole.   Azelastine HCl 137 MCG/SPRAY SOLN Place 2 sprays into both nostrils 2 (two) times daily. USE 1 TO 2 SPRAYS EACH NOSTRIL 2 X /DAY (Patient taking differently: Place 2 sprays into both nostrils 2 (two) times daily.)   benzonatate (TESSALON) 200 MG capsule Take 1 capsule 3 x / day to Prevent Cough                                       /                           TAKE                              BY                 MOUTH (Patient taking differently: Take 200 mg by mouth 3 (three) times daily.)   Botulinum Toxin Type A 200 units SOLR Inject 200 Units into the skin every 3 (three) months.   budesonide (PULMICORT) 0.5 MG/2ML nebulizer solution Take 2 mLs (0.5 mg total) by nebulization 3 (three)  times daily as needed. (Patient taking differently: Take 0.5 mg by nebulization 2 (two) times daily.)   Calcium Carb-Cholecalciferol (CALCIUM + VITAMIN D3 PO) Take 1 tablet by mouth daily.   celecoxib (CELEBREX) 200 MG capsule TAKE 1 CAPSULE (200 MG TOTAL) BY MOUTH EVERY 12 (TWELVE) HOURS. (Patient taking differently: Take 200 mg by mouth 2 (two) times daily.)   cetaphil (CETAPHIL) lotion Apply topically 2 (two) times daily. (Patient taking differently: Apply 1 application. topically 2 (two) times daily.)   cetirizine (ZYRTEC) 10 MG tablet Take 1 tablet (10 mg total) by mouth 2 (two) times daily as needed for allergies (Can take an extra dose during flares). (Patient taking differently: Take 10 mg by mouth in the morning.)   Cholecalciferol (VITAMIN D3) 125 MCG (5000 UT) TABS Take 10,000 Units by mouth 2 (two) times daily. Morning and evening   Cinnamon 500 MG capsule Take 1,000 mg by mouth 3 (three) times daily.   Coenzyme Q10 (CO Q 10 PO) Take 600 mg by mouth daily.   cyclobenzaprine (FLEXERIL) 10 MG  tablet TAKE 1/2 TO 1 TABLET 3 X DAY ONLY IF NEED FOR MUSCLE SPASM (NECK) (Patient taking differently: Take 10 mg by mouth 3 (three) times daily.)   diclofenac Sodium (VOLTAREN) 1 % GEL APPLY 2 TO 4 GRAMS TOPICALLY 2 TO 4 TIMES DAILY FOR PAIN & INFLAMMATION( OTC NOT COVERED) (Patient taking differently: 2-4 g 2 (two) times daily as needed (pain).)   DM-APAP-CPM (CORICIDIN HBP MAX STRENGTH FLU PO) Take 1 tablet by mouth 3 (three) times daily.   donepezil (ARICEPT) 23 MG TABS tablet Take 23 mg by mouth daily.   doxycycline (ADOXA) 100 MG tablet Take 100 mg by mouth 2 (two) times daily. prophylactic   dupilumab (DUPIXENT) 300 MG/2ML prefilled syringe Inject 300 mg into the skin every 14 (fourteen) days.   EPINEPHrine 0.3 mg/0.3 mL IJ SOAJ injection Inject 0.3 mg into the muscle as needed. (Patient taking differently: Inject 0.3 mg into the muscle as needed for anaphylaxis.)   fluticasone (FLONASE) 50 MCG/ACT nasal spray Place 2 sprays into both nostrils daily. Use 1 to 2 sprays each Nostril 2 x /day (Patient taking differently: Place 2 sprays into both nostrils in the morning and at bedtime.)   Fluticasone-Umeclidin-Vilant (TRELEGY ELLIPTA) 100-62.5-25 MCG/ACT AEPB Inhale 1 puff into the lungs daily.   furosemide (LASIX) 80 MG tablet Take 80 mg by mouth 2 (two) times daily.   Guaifenesin 1200 MG TB12 Take 1,200 mg by mouth 3 (three) times daily.   ipratropium-albuterol (DUONEB) 0.5-2.5 (3) MG/3ML SOLN TAKE 3 MLS BY NEBULIZATION EVERY 4 (FOUR) HOURS AS NEEDED. (Patient taking differently: Take 6 mLs by nebulization every 4 (four) hours as needed (shortess of breath).)   Krill Oil 500 MG CAPS Take 500 mg by mouth 2 (two) times daily.   Magnesium 500 MG CAPS Take 500 mg by mouth 3 (three) times daily.   memantine (NAMENDA) 10 MG tablet Take 10 mg by mouth 2 (two) times daily.   metFORMIN (GLUCOPHAGE-XR) 500 MG 24 hr tablet Takes 2 tablets  2 x /day  with Meals  for Diabetes (Patient taking differently: Take  1,000 mg by mouth 2 (two) times daily.)   metoprolol tartrate (LOPRESSOR) 25 MG tablet Take 1 tablet (25 mg total) by mouth 2 (two) times daily.   Misc Natural Products (GLUCOS-CHONDROIT-MSM COMPLEX) TABS Take 2 tablets by mouth 3 (three) times daily.   montelukast (SINGULAIR) 10 MG tablet TAKE 1 TABLET BY MOUTH DAILY  FOR ALLERGIES (Patient taking differently: Take 10 mg by mouth at bedtime.)   Multiple Vitamins-Minerals (MULTIVITAMIN WITH MINERALS) tablet Take 1 tablet by mouth daily.   Multiple Vitamins-Minerals (PRESERVISION AREDS 2) CAPS Take 1 capsule by mouth 2 (two) times daily.   oxymetazoline (AFRIN) 0.05 % nasal spray Place 1 spray into both nostrils at bedtime.   pentosan polysulfate (ELMIRON) 100 MG capsule Take 100 mg by mouth 3 (three) times daily.   potassium chloride SA (KLOR-CON M) 20 MEQ tablet Take 2 tablets (40 mEq total) by mouth 2 (two) times daily.   pravastatin (PRAVACHOL) 40 MG tablet TAKE 1 TABLET BY MOUTH AT BEDTIME FOR CHOLESTEROL (Patient taking differently: Take 40 mg by mouth daily.)   pregabalin (LYRICA) 150 MG capsule Take  1 capsule  3 x /day for Chronic Pain                                          /                          TAKE                              BY               MOUTH (Patient taking differently: Take 150 mg by mouth 3 (three) times daily.)   PROBIOTIC PRODUCT PO Take 1 capsule by mouth 2 (two) times daily. VSL   promethazine-dextromethorphan (PROMETHAZINE-DM) 6.25-15 MG/5ML syrup Take  1 teaspoon (5 ml)  every 4 hours  as needed  for Cough                                   /                              TAKE            BY                MOUTH (Patient taking differently: Take 5 mLs by mouth 4 (four) times daily as needed for cough.)   sertraline (ZOLOFT) 100 MG tablet Take 100 mg by mouth 2 (two) times daily.   sodium chloride (OCEAN) 0.65 % SOLN nasal spray Place 1 spray into both nostrils 4 (four) times daily as needed for congestion. Uses each time  before other nasal sprays   tadalafil (CIALIS) 5 MG tablet Take 5 mg by mouth daily.    trospium (SANCTURA) 20 MG tablet Take 20 mg by mouth 2 (two) times daily. In the evening and at bedtime   TURMERIC PO Take 2 tablets by mouth 3 (three) times daily.   VENTOLIN HFA 108 (90 Base) MCG/ACT inhaler INHALE 2 PUFFS BY MOUTH EVERY 4 HOURS AS NEEDED FOR WHEEZE OR FOR SHORTNESS OF BREATH (Patient taking differently: Inhale 2 puffs into the lungs every 4 (four) hours as needed for wheezing or shortness of breath.)   zinc gluconate 50 MG tablet Take 50 mg by mouth 3 (three) times daily.     Allergies  Allergen Reactions   Bee Venom Swelling   Cymbalta [Duloxetine Hcl] Shortness Of Breath    Brought on asthma  Ppd [Tuberculin Purified Protein Derivative] Other (See Comments)    +ppd NEG Quantferron Gold 3/13 (shows false positive)    Calan [Verapamil] Other (See Comments)    Back pain   Tricor [Fenofibrate] Other (See Comments)    Back pain   Claritin [Loratadine] Other (See Comments)    Unknown reaction    Levaquin [Levofloxacin] Diarrhea    Social History   Socioeconomic History   Marital status: Single    Spouse name: Not on file   Number of children: Not on file   Years of education: Not on file   Highest education level: Not on file  Occupational History   Occupation: Dance movement psychotherapist  Tobacco Use   Smoking status: Former    Packs/day: 0.10    Years: 15.00    Pack years: 1.50    Types: Cigarettes    Quit date: 05/27/1983    Years since quitting: 38.4    Passive exposure: Never   Smokeless tobacco: Never   Tobacco comments:    significant second-hand exposure through mother  Vaping Use   Vaping Use: Never used  Substance and Sexual Activity   Alcohol use: Yes    Alcohol/week: 1.0 standard drink    Types: 1 Glasses of wine per week    Comment: 2 x a year   Drug use: No   Sexual activity: Never    Birth control/protection: None  Other Topics Concern   Not on file   Social History Narrative   [facility]:   Originally from [place]. Previously has lived in [place]. He has lived in [place], [place], & [place]. He has worked in Engineer, production. No pets currently. Brief exposure to a roommates bird Secretary/administrator) in college. No mold, asbestos, or hot tub exposure.    Social Determinants of Health   Financial Resource Strain: Not on file  Food Insecurity: No Food Insecurity   Worried About Charity fundraiser in the Last Year: Never true   Ran Out of Food in the Last Year: Never true  Transportation Needs: No Transportation Needs   Lack of Transportation (Medical): No   Lack of Transportation (Non-Medical): No  Physical Activity: Not on file  Stress: Not on file  Social Connections: Not on file  Intimate Partner Violence: Not on file     Review of Systems: General: negative for chills, fever, night sweats or weight changes.  Cardiovascular: negative for chest pain, dyspnea on exertion, edema, orthopnea, palpitations, paroxysmal nocturnal dyspnea or shortness of breath Dermatological: negative for rash Respiratory: negative for cough or wheezing Urologic: negative for hematuria Abdominal: negative for nausea, vomiting, diarrhea, bright red blood per rectum, melena, or hematemesis Neurologic: negative for visual changes, syncope, or dizziness All other systems reviewed and are otherwise negative except as noted above.    Blood pressure (!) 160/88, pulse 88, height 6' (1.829 m), weight (!) 376 lb 9.6 oz (170.8 kg), SpO2 93 %.  General appearance: alert and no distress Neck: no adenopathy, no carotid bruit, no JVD, supple, symmetrical, trachea midline, and thyroid not enlarged, symmetric, no tenderness/mass/nodules Lungs: clear to auscultation bilaterally Heart: regular rate and rhythm, S1, S2 normal, no murmur, click, rub or gallop Extremities: extremities normal, atraumatic, no cyanosis or edema Pulses: 2+ and symmetric Skin: Skin  color, texture, turgor normal. No rashes or lesions Neurologic: Grossly normal  EKG not performed today  ASSESSMENT AND PLAN:   Hypertension History of essential hypertension a blood pressure measured today 160/88.  He  is on metoprolol.  Hyperlipidemia, mixed History of hyperlipidemia on statin therapy with lipid profile performed 05/15/2021 revealing total cholesterol 160, LDL 91 and HDL 29.  OSA and COPD overlap syndrome (HCC) History of obstructive sleep apnea intolerant to CPAP  Acute deep vein thrombosis (DVT) of right lower extremity ([place]) History of right lower extremity DVT on Eliquis oral anticoagulation.     Lorretta Harp MD FACP,FACC,FAHA, [facility] 10/17/2021 2:59 PM

## 2021-10-17 NOTE — Assessment & Plan Note (Signed)
History of hyperlipidemia on statin therapy with lipid profile performed 05/15/2021 revealing total cholesterol 160, LDL 91 and HDL 29.

## 2021-10-18 ENCOUNTER — Encounter: Payer: Self-pay | Admitting: Nurse Practitioner

## 2021-10-23 ENCOUNTER — Ambulatory Visit: Payer: BC Managed Care – PPO | Admitting: Nurse Practitioner

## 2021-10-23 ENCOUNTER — Encounter: Payer: Self-pay | Admitting: Nurse Practitioner

## 2021-10-23 VITALS — BP 130/84 | HR 86 | Temp 99.0°F | Ht 72.0 in | Wt 375.2 lb

## 2021-10-23 DIAGNOSIS — J189 Pneumonia, unspecified organism: Secondary | ICD-10-CM

## 2021-10-23 DIAGNOSIS — J449 Chronic obstructive pulmonary disease, unspecified: Secondary | ICD-10-CM

## 2021-10-23 DIAGNOSIS — J9611 Chronic respiratory failure with hypoxia: Secondary | ICD-10-CM | POA: Diagnosis not present

## 2021-10-23 DIAGNOSIS — R0600 Dyspnea, unspecified: Secondary | ICD-10-CM | POA: Diagnosis not present

## 2021-10-23 NOTE — Patient Instructions (Addendum)
Continue Trelegy 1 puff daily. Brush tongue and rinse mouth afterwards  Continue Albuterol inhaler 2 puffs or duoneb 3 mL neb every 6 hours as needed for shortness of breath or wheezing. Notify if symptoms persist despite rescue inhaler/neb use. Continue budesonide 2 mL neb Twice daily as needed for shortness of breath or wheezing  Continue Zyrtec 10 mg daily for allergies  Continue flonase nasal spray 2 sprays each nostril daily Continue singulair 10 mg At bedtime  Continue supplemental oxygen 3 lpm POC with activity and 2 lpm continuous at night for goal >88-90%   Sputum culture, AFB, and fungal - collect and return as directed   Labs - CBC with diff   Follow up in 1 week with Katie Bravery Ketcham,NP then with Dr. [name] on 6/22 at 10:15. If symptoms do not improve or worsen, please contact office for sooner follow up or seek emergency care.

## 2021-10-23 NOTE — Progress Notes (Unsigned)
$'[name]$  ID: Mark Harrell., male    DOB: Jun 14, 1960, 61 y.o.   MRN: [id number]  Chief Complaint  Patient presents with   Follow-up    Follow up. Patient says cough and sputum has gotten worse since the hospital.     Referring provider: [name], MD  HPI: 61 year old male, former remote smoker followed for asthma/COPD overlap syndrome, OSA and oxygen dependent respiratory failure.  Previously on Nucala in 2018 and switched to Bru[place] in January 2019.  Followed by asthma and immunology.  He was previously a patient of Dr. [name] but has not been seen by him since 2019.  Last seen in our office on 09/17/2021 by [name].   Patient had COVID-pneumonia in January 2023 with progressive dyspnea since.  He was treated for persistent symptoms with doxycycline and azithromycin.  He was then evaluated at the ED for hemoptysis and worsening cough and treated for pneumonia 09/03/2021 with Levaquin.  He had minimal improvement and ended up going to his PCP 4/19 after this and was treated with 15 days of Ceftin.  While on this, he was seen in the pulmonary clinic by [facility] NP.  During the visit, he noted that his sputum had become less and was more clear in color.  He did not have any wheezing and hemoptysis had mostly resolved.  Did note some increased bilateral lower extremity edema which is being managed by his PCP.  He was recommended to finish the Ceftin as previously prescribed with repeat CXR in 3 weeks to evaluate response to antibiotics.    He then went to the hospital on 09/24/2021 with worsening shortness of breath and increased leg swelling.  CXR showed a retrocardiac infiltrate.   He was also found to have a nonocclusive thrombus in the right gastrocnemius vein.  He then underwent CTA of the chest which was negative for PE but showed moderate severity bilateral lobe airspace opacity.  Sputum culture outgrowth, strep pneumonia and Legionella were negative. He was treated with IV Rocephin and  Zithromax.  He was also diuresed with good response.  Concerned that that changes on CT scan were due to to COVID and not from acute bacterial process so he was not sent home with any antibiotics.  He was started on Lovenox for DVT during hospital stay and transition to Eliquis with anticipation to be on it for 6 weeks.  He was then discharged on 09/27/2021.  Patient return to the hospital on 10/03/2021 with increased oxygen requirements and shortness of breath.  He was treated for possible AECOPD with empiric antibiotics and IV steroids which were eventually transition to oral steroids and tapered off prior to discharge.  He did have a HRCT during this stay which showed a dilated main pulmonary artery, measuring up to 4.3 cm.  He also had prominent subcentimeter mediastinal lymph nodes which were decreased in size in comparison to prior CT and suspected to be reactive.  He was noted to have bilateral solid pulmonary nodules and peripheral masslike opacities which were more pronounced in the lower lungs. Several of the nodules/masses were increased in size when compared to prior exam.  He was not discharged on any antibiotics or steroids.  Was able to be weaned back to his baseline of 2 L/min supplemental O2 and continued on his previous medications with recommendation to follow-up with pulmonary outpatient.  TEST/EVENTS:   Allergies  Allergen Reactions   Bee Venom Swelling   Cymbalta [Duloxetine Hcl] Shortness Of Breath  Brought on asthma   Ppd [Tuberculin Purified Protein Derivative] Other (See Comments)    +ppd NEG Quantferron Gold 3/13 (shows false positive)    Calan [Verapamil] Other (See Comments)    Back pain   Tricor [Fenofibrate] Other (See Comments)    Back pain   Claritin [Loratadine] Other (See Comments)    Unknown reaction    Levaquin [Levofloxacin] Diarrhea    Immunization History  Administered Date(s) Administered   Influenza,inj,Quad PF,6+ Mos 03/04/2016, 03/09/2017,  05/19/2018, 01/06/2019   Influenza-Unspecified 02/24/2015, 05/19/2018, 12/28/2018, 01/24/2019, 04/10/2021   PPD Test 08/06/2011   Pneumococcal Conjugate-13 11/02/2014   Pneumococcal Polysaccharide-23 04/10/2016   Pneumococcal-Unspecified 05/26/2004   Td 05/26/2000   Tdap 08/01/2013   Zoster Recombinat (Shingrix) 11/05/2016, 03/14/2017   Zoster, Live 05/26/2009    Past Medical History:  Diagnosis Date   Anesthesia complication requiring reversal agent administration    ? from central apnea, very difficult to get off vent   Anxiety    Arthritis    osteo   Asthma    BPH (benign prostatic hyperplasia)    Complication of anesthesia    difficulty waking , they twlight me because of my respiratory problems "   Depression    Dyspnea    on exertion   Enlarged heart    Family history of adverse reaction to anesthesia    mother trouble waking up, and heart stopped   GERD (gastroesophageal reflux disease)    Headache    botox injections for headaches   Hyperlipidemia    Hypertension    Hypogonadism male    IBS (irritable bowel syndrome)    Memory difficulties    short term memories   Neuropathy    Obesity    On home oxygen therapy    on 2 liter   OSA (obstructive sleep apnea)    not using cpap   Paralysis (HCC)    left hand - small   Pneumonia    Pre-diabetes    Prostatitis     Tobacco History: Social History   Tobacco Use  Smoking Status Former   Packs/day: 0.10   Years: 15.00   Pack years: 1.50   Types: Cigarettes   Quit date: 05/27/1983   Years since quitting: 38.4   Passive exposure: Never  Smokeless Tobacco Never  Tobacco Comments   significant second-hand exposure through mother   Counseling given: Not Answered Tobacco comments: significant second-hand exposure through mother   Outpatient Medications Prior to Visit  Medication Sig Dispense Refill   acetaminophen (TYLENOL) 500 MG tablet Take 1,000 mg by mouth 3 (three) times daily.     anastrozole  (ARIMIDEX) 1 MG tablet Take 1 mg by mouth daily.     apixaban (ELIQUIS) 5 MG TABS tablet Take 1 tablet (5 mg total) by mouth 2 (two) times daily.     ascorbic acid (VITAMIN C) 1000 MG tablet Take 1,000 mg by mouth every evening.     aspirin EC 81 MG tablet Take 81 mg by mouth daily. Swallow whole.     Azelastine HCl 137 MCG/SPRAY SOLN Place 2 sprays into both nostrils 2 (two) times daily. USE 1 TO 2 SPRAYS EACH NOSTRIL 2 X /DAY (Patient taking differently: Place 2 sprays into both nostrils 2 (two) times daily.) 30 mL 5   benzonatate (TESSALON) 200 MG capsule Take 1 capsule 3 x / day to Prevent Cough                                       /  TAKE                              BY                 MOUTH (Patient taking differently: Take 200 mg by mouth 3 (three) times daily.) 90 capsule 1   Botulinum Toxin Type A 200 units SOLR Inject 200 Units into the skin every 3 (three) months.     budesonide (PULMICORT) 0.5 MG/2ML nebulizer solution Take 2 mLs (0.5 mg total) by nebulization 3 (three) times daily as needed. (Patient taking differently: Take 0.5 mg by nebulization 2 (two) times daily.) 120 mL 5   Calcium Carb-Cholecalciferol (CALCIUM + VITAMIN D3 PO) Take 1 tablet by mouth daily.     celecoxib (CELEBREX) 200 MG capsule TAKE 1 CAPSULE (200 MG TOTAL) BY MOUTH EVERY 12 (TWELVE) HOURS. (Patient taking differently: Take 200 mg by mouth 2 (two) times daily.) 60 capsule 2   cetaphil (CETAPHIL) lotion Apply topically 2 (two) times daily. (Patient taking differently: Apply 1 application. topically 2 (two) times daily.) 236 mL 0   cetirizine (ZYRTEC) 10 MG tablet Take 1 tablet (10 mg total) by mouth 2 (two) times daily as needed for allergies (Can take an extra dose during flares). (Patient taking differently: Take 10 mg by mouth in the morning.) 60 tablet 5   Cholecalciferol (VITAMIN D3) 125 MCG (5000 UT) TABS Take 10,000 Units by mouth 2 (two) times daily. Morning and evening     Cinnamon  500 MG capsule Take 1,000 mg by mouth 3 (three) times daily.     Coenzyme Q10 (CO Q 10 PO) Take 600 mg by mouth daily.     cyclobenzaprine (FLEXERIL) 10 MG tablet TAKE 1/2 TO 1 TABLET 3 X DAY ONLY IF NEED FOR MUSCLE SPASM (NECK) (Patient taking differently: Take 10 mg by mouth 3 (three) times daily.) 90 tablet 0   diclofenac Sodium (VOLTAREN) 1 % GEL APPLY 2 TO 4 GRAMS TOPICALLY 2 TO 4 TIMES DAILY FOR PAIN & INFLAMMATION( OTC NOT COVERED) (Patient taking differently: 2-4 g 2 (two) times daily as needed (pain).) 300 g 3   DM-APAP-CPM (CORICIDIN HBP MAX STRENGTH FLU PO) Take 1 tablet by mouth 3 (three) times daily.     donepezil (ARICEPT) 23 MG TABS tablet Take 23 mg by mouth daily.     doxycycline (ADOXA) 100 MG tablet Take 100 mg by mouth 2 (two) times daily. prophylactic     dupilumab (DUPIXENT) 300 MG/2ML prefilled syringe Inject 300 mg into the skin every 14 (fourteen) days. 4 mL 11   EPINEPHrine 0.3 mg/0.3 mL IJ SOAJ injection Inject 0.3 mg into the muscle as needed. (Patient taking differently: Inject 0.3 mg into the muscle as needed for anaphylaxis.) 2 each 2   fluticasone (FLONASE) 50 MCG/ACT nasal spray Place 2 sprays into both nostrils daily. Use 1 to 2 sprays each Nostril 2 x /day (Patient taking differently: Place 2 sprays into both nostrils in the morning and at bedtime.) 48 g 1   Fluticasone-Umeclidin-Vilant (TRELEGY ELLIPTA) 100-62.5-25 MCG/ACT AEPB Inhale 1 puff into the lungs daily. 60 each 5   furosemide (LASIX) 80 MG tablet Take 80 mg by mouth 2 (two) times daily.     Guaifenesin 1200 MG TB12 Take 1,200 mg by mouth 3 (three) times daily.     ipratropium-albuterol (DUONEB) 0.5-2.5 (3) MG/3ML SOLN TAKE 3 MLS BY NEBULIZATION EVERY 4 (FOUR) HOURS AS  NEEDED. (Patient taking differently: Take 6 mLs by nebulization every 4 (four) hours as needed (shortess of breath).) 270 mL 1   Krill Oil 500 MG CAPS Take 500 mg by mouth 2 (two) times daily.     Magnesium 500 MG CAPS Take 500 mg by mouth 3  (three) times daily.     memantine (NAMENDA) 10 MG tablet Take 10 mg by mouth 2 (two) times daily.     metFORMIN (GLUCOPHAGE-XR) 500 MG 24 hr tablet Takes 2 tablets  2 x /day  with Meals  for Diabetes (Patient taking differently: Take 1,000 mg by mouth 2 (two) times daily.) 360 tablet 3   metoprolol tartrate (LOPRESSOR) 25 MG tablet Take 1 tablet (25 mg total) by mouth 2 (two) times daily. 60 tablet 0   Misc Natural Products (GLUCOS-CHONDROIT-MSM COMPLEX) TABS Take 2 tablets by mouth 3 (three) times daily.     montelukast (SINGULAIR) 10 MG tablet TAKE 1 TABLET BY MOUTH DAILY FOR ALLERGIES (Patient taking differently: Take 10 mg by mouth at bedtime.) 90 tablet 1   Multiple Vitamins-Minerals (MULTIVITAMIN WITH MINERALS) tablet Take 1 tablet by mouth daily.     Multiple Vitamins-Minerals (PRESERVISION AREDS 2) CAPS Take 1 capsule by mouth 2 (two) times daily.     oxymetazoline (AFRIN) 0.05 % nasal spray Place 1 spray into both nostrils at bedtime.     pentosan polysulfate (ELMIRON) 100 MG capsule Take 100 mg by mouth 3 (three) times daily.     potassium chloride SA (KLOR-CON M) 20 MEQ tablet Take 2 tablets (40 mEq total) by mouth 2 (two) times daily. 120 tablet 0   pravastatin (PRAVACHOL) 40 MG tablet TAKE 1 TABLET BY MOUTH AT BEDTIME FOR CHOLESTEROL (Patient taking differently: Take 40 mg by mouth daily.) 90 tablet 3   pregabalin (LYRICA) 150 MG capsule Take  1 capsule  3 x /day for Chronic Pain                                          /                          TAKE                              BY               MOUTH (Patient taking differently: Take 150 mg by mouth 3 (three) times daily.) 270 capsule 1   PROBIOTIC PRODUCT PO Take 1 capsule by mouth 2 (two) times daily. VSL     promethazine-dextromethorphan (PROMETHAZINE-DM) 6.25-15 MG/5ML syrup Take  1 teaspoon (5 ml)  every 4 hours  as needed  for Cough                                   /                              TAKE            BY                 MOUTH (Patient taking differently: Take 5 mLs by mouth 4 (four) times daily as  needed for cough.) 240 mL 3   sertraline (ZOLOFT) 100 MG tablet Take 100 mg by mouth 2 (two) times daily.     sodium chloride (OCEAN) 0.65 % SOLN nasal spray Place 1 spray into both nostrils 4 (four) times daily as needed for congestion. Uses each time before other nasal sprays     tadalafil (CIALIS) 5 MG tablet Take 5 mg by mouth daily.      trospium (SANCTURA) 20 MG tablet Take 20 mg by mouth 2 (two) times daily. In the evening and at bedtime     TURMERIC PO Take 2 tablets by mouth 3 (three) times daily.     VENTOLIN HFA 108 (90 Base) MCG/ACT inhaler INHALE 2 PUFFS BY MOUTH EVERY 4 HOURS AS NEEDED FOR WHEEZE OR FOR SHORTNESS OF BREATH (Patient taking differently: Inhale 2 puffs into the lungs every 4 (four) hours as needed for wheezing or shortness of breath.) 18 each 1   zinc gluconate 50 MG tablet Take 50 mg by mouth 3 (three) times daily.     No facility-administered medications prior to visit.     Review of Systems:   Constitutional: No weight loss or gain, night sweats, fevers, chills, fatigue, or lassitude. HEENT: No headaches, difficulty swallowing, tooth/dental problems, or sore throat. No sneezing, itching, ear ache, nasal congestion, or post nasal drip CV:  No chest pain, orthopnea, PND, swelling in lower extremities, anasarca, dizziness, palpitations, syncope Resp: No shortness of breath with exertion or at rest. No excess mucus or change in color of mucus. No productive or non-productive. No hemoptysis. No wheezing.  No chest wall deformity GI:  No heartburn, indigestion, abdominal pain, nausea, vomiting, diarrhea, change in bowel habits, loss of appetite, bloody stools.  GU: No dysuria, change in color of urine, urgency or frequency.  No flank pain, no hematuria  Skin: No rash, lesions, ulcerations MSK:  No joint pain or swelling.  No decreased range of motion.  No back pain. Neuro: No dizziness or  lightheadedness.  Psych: No depression or anxiety. Mood stable.     Physical Exam:  BP 130/84 (BP Location: Right Arm, Patient Position: Sitting, Cuff Size: Large)   Pulse 86   Temp 99 F (37.2 C) (Oral)   Ht 6' (1.829 m)   Wt (!) 375 lb 3.2 oz (170.2 kg)   SpO2 92%   BMI 50.89 kg/m   GEN: Pleasant, interactive, well-nourished/chronically-ill appearing/acutely-ill appearing/poorly-nourished/morbidly obese; in no acute distress.****** HEENT:  Normocephalic and atraumatic. EACs patent bilaterally. TM pearly gray with present light reflex bilaterally. PERRLA. Sclera white. Nasal turbinates pink, moist and patent bilaterally. No rhinorrhea present. Oropharynx pink and moist, without exudate or edema. No lesions, ulcerations, or postnasal drip.  NECK:  Supple w/ fair ROM. No JVD present. Normal carotid impulses w/o bruits. Thyroid symmetrical with no goiter or nodules palpated. No lymphadenopathy.   CV: RRR, no m/r/g, no peripheral edema. Pulses intact, +2 bilaterally. No cyanosis, pallor or clubbing. PULMONARY:  Unlabored, regular breathing. Clear bilaterally A&P w/o wheezes/rales/rhonchi. No accessory muscle use. No dullness to percussion. GI: BS present and normoactive. Soft, non-tender to palpation. No organomegaly or masses detected. No CVA tenderness. MSK: No erythema, warmth or tenderness. Cap refil <2 sec all extrem. No deformities or joint swelling noted.  Neuro: A/Ox3. No focal deficits noted.   Skin: Warm, no lesions or rashe Psych: Normal affect and behavior. Judgement and thought content appropriate.     Lab Results:  CBC    Component Value Date/Time  WBC 9.2 10/16/2021 1453   RBC 4.60 10/16/2021 1453   HGB 14.1 10/16/2021 1453   HGB 15.6 03/06/2020 1607   HGB 14.1 12/25/2010 1442   HCT 42.0 10/16/2021 1453   HCT 47.8 03/06/2020 1607   HCT 41.9 12/25/2010 1442   PLT 234 10/16/2021 1453   PLT 261 03/06/2020 1607   MCV 91.3 10/16/2021 1453   MCV 88 03/06/2020 1607    MCV 89.8 12/25/2010 1442   MCH 30.7 10/16/2021 1453   MCHC 33.6 10/16/2021 1453   RDW 14.6 10/16/2021 1453   RDW 13.4 03/06/2020 1607   RDW 13.3 12/25/2010 1442   LYMPHSABS 2,594 10/16/2021 1453   LYMPHSABS 3.0 03/06/2020 1607   LYMPHSABS 3.3 12/25/2010 1442   MONOABS 2.1 (H) 10/08/2021 0216   MONOABS 1.0 (H) 12/25/2010 1442   EOSABS 294 10/16/2021 1453   EOSABS 0.3 03/06/2020 1607   BASOSABS 83 10/16/2021 1453   BASOSABS 0.1 03/06/2020 1607   BASOSABS 0.1 12/25/2010 1442    BMET    Component Value Date/Time   NA 144 10/16/2021 1453   K 4.5 10/16/2021 1453   CL 105 10/16/2021 1453   CO2 28 10/16/2021 1453   GLUCOSE 92 10/16/2021 1453   BUN 18 10/16/2021 1453   CREATININE 0.85 10/16/2021 1453   CALCIUM 9.8 10/16/2021 1453   GFRNONAA >60 10/08/2021 0216   GFRNONAA 99 08/29/2020 1649   GFRAA 115 08/29/2020 1649    BNP    Component Value Date/Time   BNP 33.2 10/03/2021 2112   BNP 173 (H) 01/08/2017 1611     Imaging:  DG Chest 2 View  Result Date: 10/18/2021 CLINICAL DATA:  Shortness of breath and malaise since the patient was diagnosed with COVID 19 in January, 2023. EXAM: CHEST - 2 VIEW COMPARISON:  CT and plain films of the chest 10/04/2021. Plain film of the chest 08/17/2020. FINDINGS: The lungs appear clear. Heart size is normal. No pneumothorax or pleural fluid. No acute or focal bony abnormality. IMPRESSION: No acute disease. The appearance of the chest is the same as on the 08/17/2020 plain films. Electronically Signed   By: Inge Rise M.D.   On: 10/18/2021 07:30   DG Chest 2 View  Result Date: 09/24/2021 CLINICAL DATA:  Worsening shortness of breath and shortness of breath with exertion, subjective fever at home, productive cough, history COPD, asthma EXAM: CHEST - 2 VIEW COMPARISON:  09/03/2021 FINDINGS: Enlargement of cardiac silhouette with pulmonary vascular congestion. Minimal atelectasis at RIGHT base. Retrocardiac infiltrate consistent with  pneumonia. No pleural effusion or pneumothorax. Prior cervical spine fusion. IMPRESSION: Enlargement of cardiac silhouette with pulmonary vascular congestion. Retrocardiac consolidation consistent with LEFT lower lobe pneumonia. Electronically Signed   By: Lavonia Dana M.D.   On: 09/24/2021 16:34   CT Angio Chest Pulmonary Embolism (PE) W or WO Contrast  Result Date: 09/25/2021 CLINICAL DATA:  Persistent cough and dyspnea. EXAM: CT ANGIOGRAPHY CHEST WITH CONTRAST TECHNIQUE: Multidetector CT imaging of the chest was performed using the standard protocol during bolus administration of intravenous contrast. Multiplanar CT image reconstructions and MIPs were obtained to evaluate the vascular anatomy. RADIATION DOSE REDUCTION: This exam was performed according to the departmental dose-optimization program which includes automated exposure control, adjustment of the mA and/or kV according to patient size and/or use of iterative reconstruction technique. CONTRAST:  111m OMNIPAQUE IOHEXOL 350 MG/ML SOLN COMPARISON:  September 03, 2021 FINDINGS: Cardiovascular: The pulmonary arteries are limited in evaluation secondary to suboptimal opacification with intravenous contrast. The heart  size is along the upper limits of normal. No pericardial effusion. Mediastinum/Nodes: There is mild pretracheal, AP window and right hilar lymphadenopathy. Thyroid gland, trachea, and esophagus demonstrate no significant findings. Lungs/Pleura: Patchy, moderate severity areas of airspace disease are seen within the posteromedial aspects of the bilateral lower lobes and bilateral lung bases. This is mildly increased in severity when compared to the prior study. There is no evidence of a pleural effusion or pneumothorax. Upper Abdomen: No acute abnormality. Musculoskeletal: Multilevel degenerative changes seen throughout the thoracic spine. Review of the MIP images confirms the above findings. IMPRESSION: Moderate severity bilateral lower lobe  airspace disease, mildly increased in severity when compared to the prior study. Electronically Signed   By: Virgina Norfolk M.D.   On: 09/25/2021 01:30   US RENAL  Result Date: 10/04/2021 CLINICAL DATA:  Acute renal insufficiency. EXAM: RENAL / URINARY TRACT ULTRASOUND COMPLETE COMPARISON:  CT scan from 2018 FINDINGS: Right Kidney: Renal measurements: 11.6 x 6.8 x 7.1 cm = volume: 253 mL. Limited visualization of the kidney due to body habitus. Mild renal cortical thinning for age. Grossly normal echogenicity and no obvious renal lesions or hydronephrosis. Left Kidney: Renal measurements: 12.0 x 6.6 x 6.0 cm = volume: 250.2 mL. Limited visualization of the kidney due to body habitus. Mild renal cortical thinning for age. Grossly normal echogenicity and no obvious renal lesions or hydronephrosis. Bladder: Appears normal for degree of bladder distention. Other: None. IMPRESSION: 1. Limited ultrasound examination due to body habitus. 2. Mild renal cortical thinning suggested. No renal lesions or hydronephrosis. Electronically Signed   By: Marijo Sanes M.D.   On: 10/04/2021 11:48   CT Chest High Resolution  Result Date: 10/04/2021 CLINICAL DATA:  Persistent opacity EXAM: CT CHEST WITHOUT CONTRAST TECHNIQUE: Multidetector CT imaging of the chest was performed following the standard protocol without intravenous contrast. High resolution imaging of the lungs, as well as inspiratory and expiratory imaging, was performed. RADIATION DOSE REDUCTION: This exam was performed according to the departmental dose-optimization program which includes automated exposure control, adjustment of the mA and/or kV according to patient size and/or use of iterative reconstruction technique. COMPARISON:  Chest CT dated May 3rd 2023; Chest CT dated September 03, 2021 FINDINGS: Cardiovascular: Normal heart size. No pericardial effusion. Normal caliber thoracic aorta with no atherosclerotic disease. Dilated main pulmonary artery,  measuring up to 4.3 cm. Mediastinum/Nodes: Esophagus and thyroid are unremarkable. Prominent subcentimeter mediastinal lymph nodes, decreased in size when compared with most recent prior CT and likely reactive. Reference periaortic lymph node measuring 9 mm on series 5, image 84, previously 15 mm. Lungs/Pleura: Central airways are patent. Bilateral solid pulmonary nodules and peripheral masslike opacities which are most pronounced in the lower lungs. Several of the nodules/masses have increased size when compared with prior exam. Reference masslike opacity located in the right lower lobe and measures 3.5 x 2.4 cm on series 6, image 125, previously measured 2.0 x 2.2 cm. Upper Abdomen: No acute abnormality. Musculoskeletal: No chest wall mass or suspicious bone lesions identified. IMPRESSION: 1. Bilateral solid pulmonary nodules and peripheral masslike opacities which are most pronounced in the lower lungs, several have increased in size when compared with September 03, 2021 prior exam. Differential considerations include inflammatory etiologies such as organizing pneumonia, atypical infection such as fungal pneumonia, or neoplasm. Findings are atypical for post COVID fibrosis. Recommend short-term follow-up in 2-3 months to ensure resolution. 2. Dilated main pulmonary artery, findings can be seen the setting of pulmonary hypertension. Electronically Signed  By: Yetta Glassman M.D.   On: 10/04/2021 09:45   US Venous Img Lower Unilateral Right  Result Date: 09/24/2021 CLINICAL DATA:  Right lower extremity edema and shortness of breath. EXAM: RIGHT LOWER EXTREMITY VENOUS DOPPLER ULTRASOUND TECHNIQUE: Gray-scale sonography with graded compression, as well as color Doppler and duplex ultrasound were performed to evaluate the lower extremity deep venous systems from the level of the common femoral vein and including the common femoral, femoral, profunda femoral, popliteal and calf veins including the posterior tibial,  peroneal and gastrocnemius veins when visible. The superficial great saphenous vein was also interrogated. Spectral Doppler was utilized to evaluate flow at rest and with distal augmentation maneuvers in the common femoral, femoral and popliteal veins. COMPARISON:  None Available. FINDINGS: Contralateral Common Femoral Vein: Respiratory phasicity is normal and symmetric with the symptomatic side. No evidence of thrombus. Normal compressibility. Common Femoral Vein: No evidence of thrombus. Normal compressibility, respiratory phasicity and response to augmentation. Saphenofemoral Junction: No evidence of thrombus. Normal compressibility and flow on color Doppler imaging. Profunda Femoral Vein: No evidence of thrombus. Normal compressibility and flow on color Doppler imaging. Femoral Vein: No evidence of thrombus. Normal compressibility, respiratory phasicity and response to augmentation. Popliteal Vein: No evidence of thrombus. Normal compressibility, respiratory phasicity and response to augmentation. Calf Veins: No evidence of thrombus within the RIGHT posterior tibial vein or RIGHT peroneal vein. A small amount of nonocclusive thrombus is seen within the RIGHT gastrocnemius vein with abnormal compressibility and flow on color Doppler imaging. Superficial Great Saphenous Vein: No evidence of thrombus. Normal compressibility. Venous Reflux:  None. Other Findings:  None. IMPRESSION: Small amount of nonocclusive thrombus within the RIGHT gastrocnemius vein. Electronically Signed   By: Virgina Norfolk M.D.   On: 09/24/2021 19:47   DG Chest Port 1 View  Result Date: 10/04/2021 CLINICAL DATA:  Shortness of breath. EXAM: PORTABLE CHEST 1 VIEW COMPARISON:  10/03/2021 FINDINGS: Stable cardiomediastinal contours. New bibasilar opacities which are favored to represent areas of atelectasis and or recurrent airspace disease. No signs of pleural effusion or edema. Visualized osseous structures appear intact. IMPRESSION:  New bibasilar opacities which are favored to represent areas of atelectasis and/or recurrent airspace disease. Electronically Signed   By: Kerby Moors M.D.   On: 10/04/2021 07:05   DG Chest Portable 1 View  Result Date: 10/03/2021 CLINICAL DATA:  Shortness of breath EXAM: PORTABLE CHEST 1 VIEW COMPARISON:  09/24/2021 FINDINGS: Cardiomegaly. Mild vascular congestion. Continued left retrocardiac opacity, improved since prior study. No focal opacity on the right. No effusions or acute bony abnormality. IMPRESSION: Cardiomegaly, vascular congestion. Left lower lobe airspace disease persists but is improved since prior study. Electronically Signed   By: Rolm Baptise M.D.   On: 10/03/2021 22:08   ECHOCARDIOGRAM COMPLETE  Result Date: 09/25/2021    ECHOCARDIOGRAM REPORT   Patient Name:   Mark Harrell. Date of Exam: 09/25/2021 Medical Rec #:  [id number]        Height:       72.0 in Accession #:    [id number]       Weight:       384.9 lb Date of Birth:  1960/07/22        BSA:          2.814 m Patient Age:    60 years         BP:           122/76 mmHg Patient Gender: M  HR:           90 bpm. Exam Location:  Inpatient Procedure: 2D Echo, Cardiac Doppler and Color Doppler Indications:    CHF  History:        Patient has prior history of Echocardiogram examinations, most                 recent 02/23/2017. COPD; Risk Factors:Sleep Apnea and                 Hypertension.  Sonographer:    Jefferey Pica Referring Phys: [id number] [place]  1. Left ventricular ejection fraction, by estimation, is 70 to 75%. The left ventricle has hyperdynamic function. The left ventricle has no regional wall motion abnormalities. There is moderate left ventricular hypertrophy. Left ventricular diastolic parameters are consistent with Grade I diastolic dysfunction (impaired relaxation).  2. Right ventricular systolic function is normal. The right ventricular size is normal.  3. The mitral valve is normal in  structure. No evidence of mitral valve regurgitation. No evidence of mitral stenosis.  4. The aortic valve is normal in structure. Aortic valve regurgitation is not visualized. No aortic stenosis is present.  5. The inferior vena cava is normal in size with greater than 50% respiratory variability, suggesting right atrial pressure of 3 mmHg. FINDINGS  Left Ventricle: Left ventricular ejection fraction, by estimation, is 70 to 75%. The left ventricle has hyperdynamic function. The left ventricle has no regional wall motion abnormalities. The left ventricular internal cavity size was normal in size. There is moderate left ventricular hypertrophy. Left ventricular diastolic parameters are consistent with Grade I diastolic dysfunction (impaired relaxation). Right Ventricle: The right ventricular size is normal. No increase in right ventricular wall thickness. Right ventricular systolic function is normal. Left Atrium: Left atrial size was normal in size. Right Atrium: Right atrial size was normal in size. Pericardium: There is no evidence of pericardial effusion. Mitral Valve: The mitral valve is normal in structure. No evidence of mitral valve regurgitation. No evidence of mitral valve stenosis. Tricuspid Valve: The tricuspid valve is normal in structure. Tricuspid valve regurgitation is not demonstrated. No evidence of tricuspid stenosis. Aortic Valve: The aortic valve is normal in structure. Aortic valve regurgitation is not visualized. No aortic stenosis is present. Aortic valve peak gradient measures 10.5 mmHg. Pulmonic Valve: The pulmonic valve was normal in structure. Pulmonic valve regurgitation is not visualized. No evidence of pulmonic stenosis. Aorta: The aortic root is normal in size and structure. Venous: The inferior vena cava is normal in size with greater than 50% respiratory variability, suggesting right atrial pressure of 3 mmHg. IAS/Shunts: No atrial level shunt detected by color flow Doppler.  LEFT  VENTRICLE PLAX 2D LVIDd:         5.20 cm   Diastology LVIDs:         2.90 cm   LV e' lateral:   5.19 cm/s LV PW:         1.40 cm   LV E/e' lateral: 16.5 LV IVS:        1.50 cm LVOT diam:     2.20 cm LV SV:         109 LV SV Index:   39 LVOT Area:     3.80 cm  IVC IVC diam: 2.80 cm LEFT ATRIUM              Index LA diam:        3.60 cm  1.28 cm/m LA Vol (  A2C):   125.0 ml 44.42 ml/m LA Vol (A4C):   73.9 ml  26.26 ml/m LA Biplane Vol: 97.4 ml  34.61 ml/m  AORTIC VALVE                  PULMONIC VALVE AV Area (Vmax): 3.33 cm      PV Vmax:       0.79 m/s AV Vmax:        162.00 cm/s   PV Peak grad:  2.5 mmHg AV Peak Grad:   10.5 mmHg LVOT Vmax:      142.00 cm/s LVOT Vmean:     102.000 cm/s LVOT VTI:       0.286 m  AORTA Ao Root diam: 3.60 cm Ao Asc diam:  3.90 cm MITRAL VALVE MV Area (PHT): 3.79 cm    SHUNTS MV Decel Time: 200 msec    Systemic VTI:  0.29 m MV E velocity: 85.50 cm/s  Systemic Diam: 2.20 cm MV A velocity: 80.60 cm/s MV E/A ratio:  1.06 Candee Furbish MD Electronically signed by Candee Furbish MD Signature Date/Time: 09/25/2021/10:46:06 AM    Final          Latest Ref Rng & Units 12/28/2017    2:28 PM 04/21/2016    8:06 AM  PFT Results  FVC-Pre L 3.58   3.47    FVC-Predicted Pre % 68   66    FVC-Post L 3.41   3.84    FVC-Predicted Post % 65   73    Pre FEV1/FVC % % 75   74    Post FEV1/FCV % % 79   72    FEV1-Pre L 2.70   2.58    FEV1-Predicted Pre % 68   64    FEV1-Post L 2.69   2.78    DLCO uncorrected ml/min/mmHg 33.07   33.45    DLCO UNC% % 94   95    DLVA Predicted % 125   117    TLC L 5.84   6.21    TLC % Predicted % 79   84    RV % Predicted % 100   96      Lab Results  Component Value Date   NITRICOXIDE 10 12/28/2017        Assessment & Plan:   No problem-specific Assessment & Plan notes found for this encounter.   I spent *** minutes of dedicated to the care of this patient on the date of this encounter to include pre-visit review of records, face-to-face time  with the patient discussing conditions above, post visit ordering of testing, clinical documentation with the electronic health record, making appropriate referrals as documented, and communicating necessary findings to members of the patients care team.  Clayton Bibles, NP 10/23/2021  Pt aware and understands NP's role.

## 2021-10-24 ENCOUNTER — Encounter: Payer: Self-pay | Admitting: Nurse Practitioner

## 2021-10-24 ENCOUNTER — Other Ambulatory Visit: Payer: Self-pay | Admitting: Allergy & Immunology

## 2021-10-24 LAB — CBC WITH DIFFERENTIAL/PLATELET
Basophils Absolute: 0.1 10*3/uL (ref 0.0–0.1)
Basophils Relative: 1.3 % (ref 0.0–3.0)
Eosinophils Absolute: 0.3 10*3/uL (ref 0.0–0.7)
Eosinophils Relative: 3.3 % (ref 0.0–5.0)
HCT: 41.7 % (ref 39.0–52.0)
Hemoglobin: 13.9 g/dL (ref 13.0–17.0)
Lymphocytes Relative: 27.9 % (ref 12.0–46.0)
Lymphs Abs: 2.9 10*3/uL (ref 0.7–4.0)
MCHC: 33.3 g/dL (ref 30.0–36.0)
MCV: 91 fl (ref 78.0–100.0)
Monocytes Absolute: 1.2 10*3/uL — ABNORMAL HIGH (ref 0.1–1.0)
Monocytes Relative: 11.1 % (ref 3.0–12.0)
Neutro Abs: 5.9 10*3/uL (ref 1.4–7.7)
Neutrophils Relative %: 56.4 % (ref 43.0–77.0)
Platelets: 200 10*3/uL (ref 150.0–400.0)
RBC: 4.58 Mil/uL (ref 4.22–5.81)
RDW: 15.8 % — ABNORMAL HIGH (ref 11.5–15.5)
WBC: 10.5 10*3/uL (ref 4.0–10.5)

## 2021-10-24 NOTE — Assessment & Plan Note (Addendum)
Clinically improved since before his hospitalization. He has started to develop an increased productive cough over the last week. HRCT from 5/16 showed improved LAD but multiple increased nodules/masses. Recent CXR was clear; although, he did not have changes on previous plain films. Recommended that we defer further abx treatment at this point. Check AFB, fungal and sputum culture - pt to return tomorrow. CBC with diff to assess for leukocytosis. Mucociliary clearance therapies. I am concerned he may need bronchoscopy in the future to rule out underlying etiology, which we discussed. Plan to get him in with Mark Harrell ASAP.   Patient Instructions  Continue Trelegy 1 puff daily. Brush tongue and rinse mouth afterwards  Continue Albuterol inhaler 2 puffs or duoneb 3 mL neb every 6 hours as needed for shortness of breath or wheezing. Notify if symptoms persist despite rescue inhaler/neb use. Continue budesonide 2 mL neb Twice daily as needed for shortness of breath or wheezing  Continue Zyrtec 10 mg daily for allergies  Continue flonase nasal spray 2 sprays each nostril daily Continue singulair 10 mg At bedtime  Continue supplemental oxygen 3 lpm POC with activity and 2 lpm continuous at night for goal >88-90%   Mucinex 600 mg Twice daily  Flutter valve 2-3 times a day   Sputum culture, AFB, and fungal - collect and return as directed   Labs - CBC with diff   Follow up in 1 week with Mark Geary Rufo,NP then with Mark Harrell on 6/22 at 10:15. If symptoms do not improve or worsen, please contact office for sooner follow up or seek emergency care.

## 2021-10-24 NOTE — Progress Notes (Signed)
Please notify patient that his WBC on his CBC were normal but borderline. We will await his sputum cultures. Thanks.

## 2021-10-24 NOTE — Assessment & Plan Note (Signed)
Breathing stable and no evidence of bronchospasm. Continue triple therapy regimen. Advised that he use nebs Twice daily until symptoms improve. Continue Zyrtec and Singulair for trigger prevention.

## 2021-10-25 ENCOUNTER — Encounter: Payer: Self-pay | Admitting: Cardiovascular Disease

## 2021-10-29 NOTE — Progress Notes (Unsigned)
Assessment and Plan:  There are no diagnoses linked to this encounter.    Further disposition pending results of labs. Discussed med's effects and SE's.   Over 30 minutes of exam, counseling, chart review, and critical decision making was performed.   Future Appointments  Date Time Provider [place]  10/30/2021  3:30 PM Alycia Rossetti, NP [name]  11/12/2021  3:45 PM [name], MD [name]  11/13/2021  3:30 PM Liane Comber, NP [name]  11/14/2021 10:15 AM Marshell Garfinkel, MD [facility] None  02/18/2022  3:00 PM [name], MD [name]    ------------------------------------------------------------------------------------------------------------------   HPI There were no vitals taken for this visit. 61 y.o.male presents for  Past Medical History:  Diagnosis Date   Anesthesia complication requiring reversal agent administration    ? from central apnea, very difficult to get off vent   Anxiety    Arthritis    osteo   Asthma    BPH (benign prostatic hyperplasia)    Complication of anesthesia    difficulty waking , they twlight me because of my respiratory problems "   Depression    Dyspnea    on exertion   Enlarged heart    Family history of adverse reaction to anesthesia    mother trouble waking up, and heart stopped   GERD (gastroesophageal reflux disease)    Headache    botox injections for headaches   Hyperlipidemia    Hypertension    Hypogonadism male    IBS (irritable bowel syndrome)    Memory difficulties    short term memories   Neuropathy    Obesity    On home oxygen therapy    on 2 liter   OSA (obstructive sleep apnea)    not using cpap   Paralysis (HCC)    left hand - small   Pneumonia    Pre-diabetes    Prostatitis      Allergies  Allergen Reactions   Bee Venom Swelling   Cymbalta [Duloxetine Hcl] Shortness Of Breath    Brought on asthma   Ppd [Tuberculin Purified Protein Derivative]  Other (See Comments)    +ppd NEG Quantferron Gold 3/13 (shows false positive)    Calan [Verapamil] Other (See Comments)    Back pain   Tricor [Fenofibrate] Other (See Comments)    Back pain   Claritin [Loratadine] Other (See Comments)    Unknown reaction    Levaquin [Levofloxacin] Diarrhea    Current Outpatient Medications on File Prior to Visit  Medication Sig   acetaminophen (TYLENOL) 500 MG tablet Take 1,000 mg by mouth 3 (three) times daily.   anastrozole (ARIMIDEX) 1 MG tablet Take 1 mg by mouth daily.   apixaban (ELIQUIS) 5 MG TABS tablet Take 1 tablet (5 mg total) by mouth 2 (two) times daily.   ascorbic acid (VITAMIN C) 1000 MG tablet Take 1,000 mg by mouth every evening.   aspirin EC 81 MG tablet Take 81 mg by mouth daily. Swallow whole.   Azelastine HCl 137 MCG/SPRAY SOLN Place 2 sprays into both nostrils 2 (two) times daily. USE 1 TO 2 SPRAYS EACH NOSTRIL 2 X /DAY (Patient taking differently: Place 2 sprays into both nostrils 2 (two) times daily.)   benzonatate (TESSALON) 200 MG capsule Take 1 capsule 3 x / day to Prevent Cough                                       /  TAKE                              BY                 MOUTH (Patient taking differently: Take 200 mg by mouth 3 (three) times daily.)   Botulinum Toxin Type A 200 units SOLR Inject 200 Units into the skin every 3 (three) months.   budesonide (PULMICORT) 0.5 MG/2ML nebulizer solution Take 2 mLs (0.5 mg total) by nebulization 3 (three) times daily as needed. (Patient taking differently: Take 0.5 mg by nebulization 2 (two) times daily.)   Calcium Carb-Cholecalciferol (CALCIUM + VITAMIN D3 PO) Take 1 tablet by mouth daily.   celecoxib (CELEBREX) 200 MG capsule TAKE 1 CAPSULE (200 MG TOTAL) BY MOUTH EVERY 12 (TWELVE) HOURS. (Patient taking differently: Take 200 mg by mouth 2 (two) times daily.)   cetaphil (CETAPHIL) lotion Apply topically 2 (two) times daily. (Patient taking differently: Apply 1  application. topically 2 (two) times daily.)   cetirizine (ZYRTEC) 10 MG tablet Take 1 tablet (10 mg total) by mouth 2 (two) times daily as needed for allergies (Can take an extra dose during flares). (Patient taking differently: Take 10 mg by mouth in the morning.)   Cholecalciferol (VITAMIN D3) 125 MCG (5000 UT) TABS Take 10,000 Units by mouth 2 (two) times daily. Morning and evening   Cinnamon 500 MG capsule Take 1,000 mg by mouth 3 (three) times daily.   Coenzyme Q10 (CO Q 10 PO) Take 600 mg by mouth daily.   cyclobenzaprine (FLEXERIL) 10 MG tablet TAKE 1/2 TO 1 TABLET 3 X DAY ONLY IF NEED FOR MUSCLE SPASM (NECK) (Patient taking differently: Take 10 mg by mouth 3 (three) times daily.)   diclofenac Sodium (VOLTAREN) 1 % GEL APPLY 2 TO 4 GRAMS TOPICALLY 2 TO 4 TIMES DAILY FOR PAIN & INFLAMMATION( OTC NOT COVERED) (Patient taking differently: 2-4 g 2 (two) times daily as needed (pain).)   DM-APAP-CPM (CORICIDIN HBP MAX STRENGTH FLU PO) Take 1 tablet by mouth 3 (three) times daily.   donepezil (ARICEPT) 23 MG TABS tablet Take 23 mg by mouth daily.   doxycycline (ADOXA) 100 MG tablet Take 100 mg by mouth 2 (two) times daily. prophylactic   dupilumab (DUPIXENT) 300 MG/2ML prefilled syringe Inject 300 mg into the skin every 14 (fourteen) days.   EPINEPHrine 0.3 mg/0.3 mL IJ SOAJ injection Inject 0.3 mg into the muscle as needed. (Patient taking differently: Inject 0.3 mg into the muscle as needed for anaphylaxis.)   fluticasone (FLONASE) 50 MCG/ACT nasal spray Place 2 sprays into both nostrils daily. Use 1 to 2 sprays each Nostril 2 x /day (Patient taking differently: Place 2 sprays into both nostrils in the morning and at bedtime.)   Fluticasone-Umeclidin-Vilant (TRELEGY ELLIPTA) 100-62.5-25 MCG/ACT AEPB Inhale 1 puff into the lungs daily.   furosemide (LASIX) 80 MG tablet Take 80 mg by mouth 2 (two) times daily.   Guaifenesin 1200 MG TB12 Take 1,200 mg by mouth 3 (three) times daily.    ipratropium-albuterol (DUONEB) 0.5-2.5 (3) MG/3ML SOLN TAKE 3 MLS BY NEBULIZATION EVERY 4 (FOUR) HOURS AS NEEDED. (Patient taking differently: Take 6 mLs by nebulization every 4 (four) hours as needed (shortess of breath).)   Krill Oil 500 MG CAPS Take 500 mg by mouth 2 (two) times daily.   Magnesium 500 MG CAPS Take 500 mg by mouth 3 (three) times daily.   memantine (  NAMENDA) 10 MG tablet Take 10 mg by mouth 2 (two) times daily.   metFORMIN (GLUCOPHAGE-XR) 500 MG 24 hr tablet Takes 2 tablets  2 x /day  with Meals  for Diabetes (Patient taking differently: Take 1,000 mg by mouth 2 (two) times daily.)   metoprolol tartrate (LOPRESSOR) 25 MG tablet Take 1 tablet (25 mg total) by mouth 2 (two) times daily.   Misc Natural Products (GLUCOS-CHONDROIT-MSM COMPLEX) TABS Take 2 tablets by mouth 3 (three) times daily.   montelukast (SINGULAIR) 10 MG tablet TAKE 1 TABLET BY MOUTH DAILY FOR ALLERGIES (Patient taking differently: Take 10 mg by mouth at bedtime.)   Multiple Vitamins-Minerals (MULTIVITAMIN WITH MINERALS) tablet Take 1 tablet by mouth daily.   Multiple Vitamins-Minerals (PRESERVISION AREDS 2) CAPS Take 1 capsule by mouth 2 (two) times daily.   oxymetazoline (AFRIN) 0.05 % nasal spray Place 1 spray into both nostrils at bedtime.   pentosan polysulfate (ELMIRON) 100 MG capsule Take 100 mg by mouth 3 (three) times daily.   potassium chloride SA (KLOR-CON M) 20 MEQ tablet Take 2 tablets (40 mEq total) by mouth 2 (two) times daily.   pravastatin (PRAVACHOL) 40 MG tablet TAKE 1 TABLET BY MOUTH AT BEDTIME FOR CHOLESTEROL (Patient taking differently: Take 40 mg by mouth daily.)   pregabalin (LYRICA) 150 MG capsule Take  1 capsule  3 x /day for Chronic Pain                                          /                          TAKE                              BY               MOUTH (Patient taking differently: Take 150 mg by mouth 3 (three) times daily.)   PROBIOTIC PRODUCT PO Take 1 capsule by mouth 2 (two)  times daily. VSL   promethazine-dextromethorphan (PROMETHAZINE-DM) 6.25-15 MG/5ML syrup Take  1 teaspoon (5 ml)  every 4 hours  as needed  for Cough                                   /                              TAKE            BY                MOUTH (Patient taking differently: Take 5 mLs by mouth 4 (four) times daily as needed for cough.)   sertraline (ZOLOFT) 100 MG tablet Take 100 mg by mouth 2 (two) times daily.   sodium chloride (OCEAN) 0.65 % SOLN nasal spray Place 1 spray into both nostrils 4 (four) times daily as needed for congestion. Uses each time before other nasal sprays   tadalafil (CIALIS) 5 MG tablet Take 5 mg by mouth daily.    trospium (SANCTURA) 20 MG tablet Take 20 mg by mouth 2 (two) times daily. In the evening and at bedtime   TURMERIC PO Take 2  tablets by mouth 3 (three) times daily.   VENTOLIN HFA 108 (90 Base) MCG/ACT inhaler INHALE 2 PUFFS BY MOUTH EVERY 4 HOURS AS NEEDED FOR WHEEZE OR FOR SHORTNESS OF BREATH (Patient taking differently: Inhale 2 puffs into the lungs every 4 (four) hours as needed for wheezing or shortness of breath.)   zinc gluconate 50 MG tablet Take 50 mg by mouth 3 (three) times daily.   No current facility-administered medications on file prior to visit.    ROS: all negative except above.   Physical Exam:  There were no vitals taken for this visit.  General Appearance: Well nourished, in no apparent distress. Eyes: PERRLA, EOMs, conjunctiva no swelling or erythema Sinuses: No Frontal/maxillary tenderness ENT/Mouth: Ext aud canals clear, TMs without erythema, bulging. No erythema, swelling, or exudate on post pharynx.  Tonsils not swollen or erythematous. Hearing normal.  Neck: Supple, thyroid normal.  Respiratory: Respiratory effort normal, BS equal bilaterally without rales, rhonchi, wheezing or stridor.  Cardio: RRR with no MRGs. Brisk peripheral pulses without edema.  Abdomen: Soft, + BS.  Non tender, no guarding, rebound, hernias,  masses. Lymphatics: Non tender without lymphadenopathy.  Musculoskeletal: Full ROM, 5/5 strength, normal gait.  Skin: Warm, dry without rashes, lesions, ecchymosis.  Neuro: Cranial nerves intact. Normal muscle tone, no cerebellar symptoms. Sensation intact.  Psych: Awake and oriented X 3, normal affect, Insight and Judgment appropriate.     Alycia Rossetti, NP 1:54 PM [facility]

## 2021-10-30 ENCOUNTER — Ambulatory Visit: Payer: BC Managed Care – PPO | Admitting: Nurse Practitioner

## 2021-10-30 ENCOUNTER — Encounter: Payer: Self-pay | Admitting: Nurse Practitioner

## 2021-10-30 VITALS — BP 154/78 | HR 97 | Temp 97.9°F | Wt 377.8 lb

## 2021-10-30 DIAGNOSIS — J9621 Acute and chronic respiratory failure with hypoxia: Secondary | ICD-10-CM | POA: Diagnosis not present

## 2021-10-30 DIAGNOSIS — J398 Other specified diseases of upper respiratory tract: Secondary | ICD-10-CM

## 2021-10-30 DIAGNOSIS — J189 Pneumonia, unspecified organism: Secondary | ICD-10-CM

## 2021-11-05 ENCOUNTER — Other Ambulatory Visit: Payer: Self-pay | Admitting: Nurse Practitioner

## 2021-11-05 ENCOUNTER — Encounter: Payer: Self-pay | Admitting: Internal Medicine

## 2021-11-05 MED ORDER — METOPROLOL TARTRATE 25 MG PO TABS: 25.0000 mg | ORAL_TABLET | Freq: Two times a day (BID) | ORAL | 2 refills | Status: DC

## 2021-11-05 MED ORDER — APIXABAN 5 MG PO TABS: 5.0000 mg | ORAL_TABLET | Freq: Two times a day (BID) | ORAL | Status: DC

## 2021-11-05 MED ORDER — SEMAGLUTIDE(0.25 OR 0.5MG/DOS) 2 MG/3ML ~~LOC~~ SOPN: 0.2500 mg | PEN_INJECTOR | SUBCUTANEOUS | 1 refills | Status: DC

## 2021-11-05 NOTE — Telephone Encounter (Signed)
Mychart message sent by pt: Mark Harrell.  P [facility] (supporting [name] V, NP) 24 minutes ago (10:29 AM)    I have been trying to produce sputum in the morning as requested, but I have not been able to.  I usually bring up small amounts, and it is more likely to occur in the evening or at night.  Nine plus times out of ten, it is white.  The outlier feels as if it is coming from deeper, and it is green.  What would you like me to do? Thanks, [name], please advise

## 2021-11-05 NOTE — Telephone Encounter (Signed)
That's okay. Not much he can do if his cough is minimally productive. He can keep the cups and return if he begins to produce sputum. Plan to follow up with Dr. [name] as scheduled.

## 2021-11-06 MED ORDER — APIXABAN 5 MG PO TABS: 5.0000 mg | ORAL_TABLET | Freq: Two times a day (BID) | ORAL | 1 refills | Status: DC

## 2021-11-12 ENCOUNTER — Ambulatory Visit: Payer: BC Managed Care – PPO | Admitting: Allergy & Immunology

## 2021-11-12 ENCOUNTER — Other Ambulatory Visit: Payer: Self-pay | Admitting: Allergy & Immunology

## 2021-11-12 ENCOUNTER — Other Ambulatory Visit: Payer: Self-pay | Admitting: Internal Medicine

## 2021-11-12 VITALS — BP 134/80 | HR 90 | Temp 98.8°F | Resp 20

## 2021-11-12 DIAGNOSIS — R918 Other nonspecific abnormal finding of lung field: Secondary | ICD-10-CM

## 2021-11-12 DIAGNOSIS — R059 Cough, unspecified: Secondary | ICD-10-CM

## 2021-11-12 DIAGNOSIS — J449 Chronic obstructive pulmonary disease, unspecified: Secondary | ICD-10-CM

## 2021-11-12 DIAGNOSIS — J3089 Other allergic rhinitis: Secondary | ICD-10-CM

## 2021-11-12 DIAGNOSIS — L2084 Intrinsic (allergic) eczema: Secondary | ICD-10-CM

## 2021-11-12 DIAGNOSIS — S161XXD Strain of muscle, fascia and tendon at neck level, subsequent encounter: Secondary | ICD-10-CM

## 2021-11-12 DIAGNOSIS — B999 Unspecified infectious disease: Secondary | ICD-10-CM | POA: Diagnosis not present

## 2021-11-12 DIAGNOSIS — J302 Other seasonal allergic rhinitis: Secondary | ICD-10-CM

## 2021-11-12 NOTE — Patient Instructions (Addendum)
1. 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 - Be sure to get the bivalent Omicron booster. - We many need to consider starting immunoglobulin replacement in the future, especially in light of the fact that you are continuing with multiple infections.  2. Asthma-COPD overlap syndrome - Lung testing not done today. - I look forward to seeing what Dr. [name] thinks.   - Daily controller medication(s): Trelegy 200/62.5/25 one puff once daily - Prior to physical activity: ProAir 2 puffs 10-15 minutes before physical activity. - Rescue medications: ProAir 4 puffs every 4-6 hours as needed or DuoNeb nebulizer one vial every 4-6 hours as needed - Asthma control goals:  * Full participation in all desired activities (may need albuterol before activity) * Albuterol use two time or less a week on average (not counting use with activity) * Cough interfering with sleep two time or less a month * Oral steroids no more than once a year * No hospitalizations   3. Chronic allergic rhinitis (sweet vernal grass, box elder, cat, weeds, ragweed, molds, cockroach, dust mite) - Continue with aszelastine/fluticasone 2 sprays per nostril up to twice daily. - Continue with saline mist 1-2 times daily.  - Continue with cetirizine '10mg'$  daily as needed for breakthrough symptoms. - Continue with montelukast '10mg'$  in the evening.   4. GERD - Continue famotidine as needed.  5. Return in about 6 weeks (around 12/24/2021).     Please inform us of any Emergency Department visits, hospitalizations, or changes in symptoms. Call us before going to the ED for breathing or allergy symptoms since we might be able to fit you in for a sick visit. Feel free to contact us anytime with any questions, problems, or concerns.  It was a pleasure to see you again today!  Websites that have reliable patient  information: 1. American Academy of Asthma, Allergy, and Immunology: www.aaaai.org 2. Food Allergy Research and Education (FARE): foodallergy.org 3. Mothers of Asthmatics: http://www.asthmacommunitynetwork.org 4. American College of Allergy, Asthma, and Immunology: www.acaai.org   COVID-19 Vaccine Information can be found at: ShippingScam.co.uk For questions related to vaccine distribution or appointments, please email [email or web address] or call [phone].   We realize that you might be concerned about having an allergic reaction to the COVID19 vaccines. To help with that concern, WE ARE OFFERING THE COVID19 VACCINES IN OUR OFFICE! Ask the front desk for dates!     "Like" [place] on Facebook and Instagram for our latest updates!      A healthy democracy works best when [employer] participate! Make sure you are registered to vote! If you have moved or changed any of your contact information, you will need to get this updated before voting!  In some cases, you MAY be able to register to vote online: [email or web address]

## 2021-11-12 NOTE — Progress Notes (Signed)
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 - with several breakthrough infections as of late with multiple hospitalizations (has been on doxycycline for a few years, but we may need to consider imunoglobulin supplementation   Asthma-COPD overlap syndrome - on Trelegy, but wanting to restart Dupixent (AEC 300)  Bilateral pulmonary nodules   Hymenoptera allergy - EpiPen up to date   GERD - on PRN H2 blocker    Obesity - with resulting nerve impingement and joint pain   Chronic back pain - s/p recent neck surgery   Migraines - on botulinum injections   Polypharmacy   Unfortunately, Mark Harrell has had a complicated course since contracting COVID and subsequent COVID-pneumonia in January 2023.  He has had shortness of breath and dyspnea since that time.  Prior to that, he was requiring 2 L of oxygen mostly at home, but is now increased his need to 4 L.  He has been on a number of antibiotics to treat continued bilateral pneumonia which was appreciated on chest CT with minimal improvement.  He was also found to have a DVT but no pulmonary embolism and is on a 6-week course of Eliquis. We are going to continue to hold the Dupixent, although I am not convinced that this is related to his current clinical state. I think immunoglobulin replacement could be warranted, but I am going to see what Dr. [name] thinks first and see what his workup is going to reveal.  Plan/Recommendations:   1. 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 - Be sure to get the bivalent Omicron booster. - We many need to consider starting immunoglobulin replacement in the future, especially in light of  the fact that you are continuing with multiple infections.  2. Asthma-COPD overlap syndrome - Lung testing not done today. - I look forward to seeing what Dr. [name] thinks.   - Daily controller medication(s): Trelegy 200/62.5/25 one puff once daily - Prior to physical activity: ProAir 2 puffs 10-15 minutes before physical activity. - Rescue medications: ProAir 4 puffs every 4-6 hours as needed or DuoNeb nebulizer one vial every 4-6 hours as needed - Asthma control goals:  * Full participation in all desired activities (may need albuterol before activity) * Albuterol use two time or less a week on average (not counting use with activity) * Cough interfering with sleep two time or less a month * Oral steroids no more than once a year * No hospitalizations   3. Chronic allergic rhinitis (sweet vernal grass, box elder, cat, weeds, ragweed, molds, cockroach, dust mite) - Continue with aszelastine/fluticasone 2 sprays per nostril up to twice daily. - Continue with saline mist 1-2 times daily.  - Continue with cetirizine '10mg'$  daily as needed for breakthrough symptoms. - Continue with montelukast '10mg'$  in the evening.   4. GERD - Continue famotidine as needed.  5. Return in about 6 weeks (around 12/24/2021).    Subjective:   Mark Harrell. is a 61 y.o. male presenting today for follow up of  Chief Complaint  Patient presents with   Follow-up    Mark Harrell. has a history of the following: Patient Active Problem List  Diagnosis Date Noted   Hypokalemia 09/26/2021   Dyspnea 09/25/2021   Acute deep vein thrombosis (DVT) of right lower extremity ([place]) 09/24/2021   OSA (obstructive sleep apnea) 09/24/2021   Herniation of cervical intervertebral disc with radiculopathy    Status post cervical discectomy 08/20/2020   Tracheomalacia 05/26/2019   Acquired tracheomalacia 05/26/2019   HNP (herniated nucleus pulposus) with myelopathy, cervical 05/23/2019    Class: Chronic   Spinal  stenosis of cervical region 05/23/2019    Class: Chronic   Status post cervical spinal fusion 05/23/2019   Acute on chronic respiratory failure with hypoxia ([facility]) 04/12/2018   Bilateral lower extremity edema 12/29/2017   Hymenoptera allergy 11/10/2017   Family history of colonic polyps 08/07/2017   Myofascial pain 08/06/2017   Seasonal and perennial allergic rhinitis 04/23/2017   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/2017   Cervicalgia 04/01/2017   Recurrent pneumonia 03/09/2017   Other spondylosis with radiculopathy, cervical region 02/09/2017   Depression with anxiety 01/04/2017   Memory difficulty 12/05/2016   Morbid obesity ([place]) 12/05/2016   Migraine without aura and without status migrainosus, not intractable 10/30/2016   Lumbar radiculopathy 05/15/2016   Osteoarthritis of spine with radiculopathy, lumbar region 05/15/2016   Risk for falls 05/15/2016   Recurrent infections 03/29/2016   Chronic nonseasonal allergic rhinitis due to pollen 03/29/2016   Polypharmacy 01/16/2016   Morbid obesity with BMI of 45.0-49.9, adult ([place]) 03/06/2015   SDAT 02/05/2015   OSA and COPD overlap syndrome ([place]) 02/05/2015   Medication management 08/02/2014   GERD (gastroesophageal reflux disease) 05/09/2014   Vitamin D deficiency 08/01/2013   Prediabetes 08/01/2013   Positive TB test 07/29/2011   Diverticula of colon 05/07/2011   Hypertension 01/31/2011   Hyperlipidemia, mixed 01/31/2011   BPH (benign prostatic hyperplasia) 01/31/2011   Testosterone Deficiency 01/31/2011   IBS (irritable bowel syndrome) 01/31/2011   Partial complex seizure disorder with intractable epilepsy ([place]) 01/31/2011   Depression, major, recurrent, in partial remission ([place]) 01/31/2011   Asthma-COPD overlap syndrome ([place]) 01/31/2011    History obtained from: chart review and patient.  Mark Harrell is a 61 y.o. male presenting for a follow up visit.  He was last seen in February 2023.  At that time, we continue with doxycycline 100 mg  twice daily for his specific antibody deficiency.  For his asthma and COPD overlap, his lung testing looked worse.  We added on Pulmicort 0.5 mg mixed with albuterol 3 times daily for a period of time. We also continued with Trelegy one puff once daily and Dupixent every two weeks.  For his rhinitis, we continue with Dymista 2 sprays per nostril up to twice daily as well as nasal saline and cetirizine.  In the interim, it seems that he was in the emergency room in April for community-acquired pneumonia.  He was then admitted in May 2023 for shortness of breath.  There was concern for pulmonary embolism.  He had COVID-pneumonia in January 2023 and had ongoing dyspnea since that time.  He had bilateral pneumonia diagnosed in April 2023.  He was treated on Ceftin but failed this treatment.  He had a DVT diagnosed at that time without a pulmonary embolism.  He was started on Eliquis.  He has to remain on Eliquis for 6 weeks total (he has 2.5 weeks left). His oxygen had to be increased to 4 L.  During the hospitalization, he was placed on IV Solu-Medrol.  He did have a chest CT that demonstrated bilateral pulmonary nodules, which were has not  during his last chest CT before 2023 in December 2020.  IMPRESSION: 1. Bilateral solid pulmonary nodules and peripheral masslike opacities which are most pronounced in the lower lungs, several have increased in size when compared with September 03, 2021 prior exam. Differential considerations include inflammatory etiologies such as organizing pneumonia, atypical infection such as fungal pneumonia, or neoplasm. Findings are atypical for post COVID fibrosis. Recommend short-term follow-up in 2-3 months to ensure resolution. 2. Dilated main pulmonary artery, findings can be seen the setting of pulmonary hypertension.   Asthma/Respiratory Symptom History: He has stopped using his Dupixent. He has some "cloudy areas " in both lungs that might be a viral or fungal infections. He  has been on a number of antibiotics. He also has some nodules. He is going to be seeing Dr. [name]. He has an appointment scheduled on Thursday with him. He remains on the Trelegy one puff once daily. He is still on budesonide BID with the albuterol as needed. He was asked to produce a sputum collection, but he has not been able to do it. There is discussion of doing a bronchoscopy. He was a casual smoker in the last 10-20 years. He is around a lot of smokers in his life.   Allergic Rhinitis Symptom History: He has been having a lot of problems with his rhinitis symptoms. He reports that he is having a lot of swelling and rhinorrhea. It can start from leaning over and standing up.  He will try cleaning it out with saline solution and nothing seems to be in there. Sometimes it seems to work and sometimes it seems to make it worse in general. There is a not a lot of consistency with it.   Skin Symptom History: Eczema is under much worse control since being off of the [place].   He was off of the doxycycline during February, March, and April. He was on around a dozen antibiotics during that time and he decided to stop using the doxycycline during that time.   Otherwise, there have been no changes to his past medical history, surgical history, family history, or social history.    Review of Systems  Constitutional:  Positive for malaise/fatigue. Negative for chills, fever and weight loss.  HENT:  Positive for congestion and sinus pain. Negative for ear discharge and ear pain.   Eyes:  Negative for pain, discharge and redness.  Respiratory:  Positive for cough and shortness of breath. Negative for sputum production and wheezing.   Cardiovascular:  Positive for chest pain. Negative for palpitations and orthopnea.  Gastrointestinal:  Positive for heartburn. Negative for abdominal pain, constipation, diarrhea, nausea and vomiting.  Skin:  Positive for itching and rash.  Neurological:  Negative for  dizziness and headaches.  Endo/Heme/Allergies:  Negative for environmental allergies. Does not bruise/bleed easily.       Objective:   Blood pressure 134/80, pulse 90, temperature 98.8 F (37.1 C), resp. rate 20, SpO2 91 %. There is no height or weight on file to calculate BMI.    Physical Exam Vitals reviewed.  Constitutional:      Appearance: He is well-developed. He is morbidly obese. He is ill-appearing. He is not toxic-appearing.  HENT:     Head: Normocephalic and atraumatic.     Right Ear: Tympanic membrane, ear canal and external ear normal.     Left Ear: Tympanic membrane, ear canal and external ear normal.     Nose: No nasal deformity, septal deviation, mucosal edema or rhinorrhea.  Right Turbinates: Enlarged, swollen and pale.     Left Turbinates: Enlarged, swollen and pale.     Right Sinus: No maxillary sinus tenderness or frontal sinus tenderness.     Left Sinus: No maxillary sinus tenderness or frontal sinus tenderness.     Mouth/Throat:     Lips: Pink.     Mouth: Mucous membranes are moist. Mucous membranes are not pale and not dry.     Pharynx: Uvula midline.  Eyes:     General: Lids are normal. Allergic shiner present.        Right eye: No discharge.        Left eye: No discharge.     Conjunctiva/sclera: Conjunctivae normal.     Right eye: Right conjunctiva is not injected. No chemosis.    Left eye: Left conjunctiva is not injected. No chemosis.    Pupils: Pupils are equal, round, and reactive to light.  Cardiovascular:     Rate and Rhythm: Normal rate and regular rhythm.     Heart sounds: Normal heart sounds.  Pulmonary:     Effort: Pulmonary effort is normal. No tachypnea, accessory muscle usage or respiratory distress.     Breath sounds: Normal breath sounds. No wheezing, rhonchi or rales.     Comments: Decreased air movement at the bases. Does not appear to be breathing as comfortably as previous exams.  Chest:     Chest wall: No tenderness.   Lymphadenopathy:     Cervical: No cervical adenopathy.  Skin:    Coloration: Skin is not pale.     Findings: No abrasion, erythema, petechiae or rash. Rash is not papular, urticarial or vesicular.  Neurological:     Mental Status: He is alert.  Psychiatric:        Behavior: Behavior is cooperative.      Diagnostic studies: none     [name], MD  [facility]

## 2021-11-13 ENCOUNTER — Encounter: Payer: Self-pay | Admitting: Allergy & Immunology

## 2021-11-13 ENCOUNTER — Ambulatory Visit: Payer: BC Managed Care – PPO | Admitting: Nurse Practitioner

## 2021-11-13 ENCOUNTER — Ambulatory Visit: Payer: BC Managed Care – PPO | Admitting: Adult Health

## 2021-11-13 ENCOUNTER — Encounter: Payer: Self-pay | Admitting: Nurse Practitioner

## 2021-11-13 VITALS — BP 136/86 | HR 90 | Temp 97.1°F | Wt 374.4 lb

## 2021-11-13 DIAGNOSIS — J309 Allergic rhinitis, unspecified: Secondary | ICD-10-CM

## 2021-11-13 DIAGNOSIS — J449 Chronic obstructive pulmonary disease, unspecified: Secondary | ICD-10-CM

## 2021-11-13 DIAGNOSIS — B999 Unspecified infectious disease: Secondary | ICD-10-CM | POA: Diagnosis not present

## 2021-11-13 DIAGNOSIS — U099 Post covid-19 condition, unspecified: Secondary | ICD-10-CM | POA: Diagnosis not present

## 2021-11-13 DIAGNOSIS — R053 Chronic cough: Secondary | ICD-10-CM

## 2021-11-13 DIAGNOSIS — Z789 Other specified health status: Secondary | ICD-10-CM

## 2021-11-13 DIAGNOSIS — Z91038 Other insect allergy status: Secondary | ICD-10-CM

## 2021-11-13 MED ORDER — TRELEGY ELLIPTA 100-62.5-25 MCG/ACT IN AEPB: 1.0000 | INHALATION_SPRAY | Freq: Every day | RESPIRATORY_TRACT | 5 refills | Status: DC

## 2021-11-13 MED ORDER — MONTELUKAST SODIUM 10 MG PO TABS: ORAL_TABLET | ORAL | 1 refills | Status: DC

## 2021-11-13 NOTE — Progress Notes (Incomplete)
Assessment and Plan:  Traveling oxygen Seeing pulm tomorrow due for brocnh - was not able suputum cx.    Is waiting on pulmonolgy will f/u in 4-6 weeks, trying on nucala, insurance has been denied.   Home health  Look at recent EGD and pyloric valve may need swallow eval. Food choking, feels stuck tries to masticate - reflux seems to be controlled.       [name] was seen today for a follow up.  Diagnoses and all order for this visit:  There are no diagnoses linked to this encounter.   Continue to monitor for any increase in fever, chills, N/V, diarrhea, changes to bowel habits, blood in stool.  Notify office for further evaluation and treatment, questions or concerns if s/s fail to improve. The risks and benefits of my recommendations, as well as other treatment options were discussed with the patient today. Questions were answered.  Further disposition pending results of labs. Discussed med's effects and SE's.    Over *** minutes of exam, counseling, chart review, and critical decision making was performed.   Future Appointments  Date Time Provider [place]  11/14/2021 10:15 AM Marshell Garfinkel, MD [facility] None  01/02/2022  4:00 PM [name], MD [name]  02/18/2022  3:00 PM [name], MD [name]    ------------------------------------------------------------------------------------------------------------------   HPI BP 136/86   Pulse 90   Temp (!) 97.1 F (36.2 C)   Wt (!) 374 lb 6.4 oz (169.8 kg)   SpO2 91%   BMI 50.78 kg/m  61 y.o.male presents for  Past Medical History:  Diagnosis Date  . Anesthesia complication requiring reversal agent administration    ? from central apnea, very difficult to get off vent  . Anxiety   . Arthritis    osteo  . Asthma   . BPH (benign prostatic hyperplasia)   . Complication of anesthesia    difficulty waking , they twlight me because of my respiratory problems "  .  Depression   . Dyspnea    on exertion  . Enlarged heart   . Family history of adverse reaction to anesthesia    mother trouble waking up, and heart stopped  . GERD (gastroesophageal reflux disease)   . Headache    botox injections for headaches  . Hyperlipidemia   . Hypertension   . Hypogonadism male   . IBS (irritable bowel syndrome)   . Memory difficulties    short term memories  . Neuropathy   . Obesity   . On home oxygen therapy    on 2 liter  . OSA (obstructive sleep apnea)    not using cpap  . Paralysis ([place])    left hand - small  . Pneumonia   . Pre-diabetes   . Prostatitis      Allergies  Allergen Reactions  . Bee Venom Swelling  . Cymbalta [Duloxetine Hcl] Shortness Of Breath    Brought on asthma  . Ppd [Tuberculin Purified Protein Derivative] Other (See Comments)    +ppd NEG Quantferron Gold 3/13 (shows false positive)   . Calan [Verapamil] Other (See Comments)    Back pain  . Tricor [Fenofibrate] Other (See Comments)    Back pain  . Claritin [Loratadine] Other (See Comments)    Unknown reaction   . Levaquin [Levofloxacin] Diarrhea    Current Outpatient Medications on File Prior to Visit  Medication Sig  . acetaminophen (TYLENOL) 500 MG tablet Take 1,000 mg by mouth 3 (three)  times daily.  [name] anastrozole (ARIMIDEX) 1 MG tablet Take 1 mg by mouth daily.  [name] apixaban (ELIQUIS) 5 MG TABS tablet Take 1 tablet (5 mg total) by mouth 2 (two) times daily.  [name] ascorbic acid (VITAMIN C) 1000 MG tablet Take 1,000 mg by mouth every evening.  [name] aspirin EC 81 MG tablet Take 81 mg by mouth daily. Swallow whole.  . Azelastine HCl 137 MCG/SPRAY SOLN Place 2 sprays into both nostrils 2 (two) times daily. USE 1 TO 2 SPRAYS EACH NOSTRIL 2 X /DAY (Patient taking differently: Place 2 sprays into both nostrils 2 (two) times daily.)  . benzonatate (TESSALON) 200 MG capsule TAKE 1 CAPSULE BY MOUTH 3 TIMES A DAY TO PREVENT COUGH  . Botulinum Toxin Type A 200 units SOLR Inject 200  Units into the skin every 3 (three) months.  . budesonide (PULMICORT) 0.5 MG/2ML nebulizer solution Take 2 mLs (0.5 mg total) by nebulization 3 (three) times daily as needed. (Patient taking differently: Take 0.5 mg by nebulization 2 (two) times daily.)  . Calcium Carb-Cholecalciferol (CALCIUM + VITAMIN D3 PO) Take 1 tablet by mouth daily.  . cetaphil (CETAPHIL) lotion Apply topically 2 (two) times daily. (Patient taking differently: Apply 1 application  topically 2 (two) times daily.)  . cetirizine (ZYRTEC) 10 MG tablet Take 1 tablet (10 mg total) by mouth 2 (two) times daily as needed for allergies (Can take an extra dose during flares). (Patient taking differently: Take 10 mg by mouth in the morning.)  . Cholecalciferol (VITAMIN D3) 125 MCG (5000 UT) TABS Take 10,000 Units by mouth 2 (two) times daily. Morning and evening  . Cinnamon 500 MG capsule Take 1,000 mg by mouth 3 (three) times daily.  . Coenzyme Q10 (CO Q 10 PO) Take 600 mg by mouth daily.  . cyclobenzaprine (FLEXERIL) 10 MG tablet TAKE 1/2 TO 1 TABLET 3 X DAY ONLY IF NEED FOR MUSCLE SPASM (NECK)  . diclofenac Sodium (VOLTAREN) 1 % GEL APPLY 2 TO 4 GRAMS TOPICALLY 2 TO 4 TIMES DAILY FOR PAIN & INFLAMMATION( OTC NOT COVERED) (Patient taking differently: 2-4 g 2 (two) times daily as needed (pain).)  . DM-APAP-CPM (CORICIDIN HBP MAX STRENGTH FLU PO) Take 1 tablet by mouth 3 (three) times daily.  [name] donepezil (ARICEPT) 23 MG TABS tablet Take 23 mg by mouth daily.  [name] doxycycline (ADOXA) 100 MG tablet Take 100 mg by mouth 2 (two) times daily. prophylactic  . dupilumab (DUPIXENT) 300 MG/2ML prefilled syringe Inject 300 mg into the skin every 14 (fourteen) days.  [name] EPINEPHrine 0.3 mg/0.3 mL IJ SOAJ injection Inject 0.3 mg into the muscle as needed. (Patient taking differently: Inject 0.3 mg into the muscle as needed for anaphylaxis.)  . fluticasone (FLONASE) 50 MCG/ACT nasal spray Place 2 sprays into both nostrils daily. Use 1 to 2 sprays each  Nostril 2 x /day (Patient taking differently: Place 2 sprays into both nostrils in the morning and at bedtime.)  . Fluticasone-Umeclidin-Vilant (TRELEGY ELLIPTA) 100-62.5-25 MCG/ACT AEPB Inhale 1 puff into the lungs daily.  . furosemide (LASIX) 80 MG tablet Take 80 mg by mouth 2 (two) times daily.  . Guaifenesin 1200 MG TB12 Take 1,200 mg by mouth 3 (three) times daily.  [name] ipratropium-albuterol (DUONEB) 0.5-2.5 (3) MG/3ML SOLN TAKE 3 MLS BY NEBULIZATION EVERY 4 (FOUR) HOURS AS NEEDED. (Patient taking differently: Take 6 mLs by nebulization every 4 (four) hours as needed (shortess of breath).)  . Krill Oil 500 MG CAPS Take 500 mg by  mouth 2 (two) times daily.  . Magnesium 500 MG CAPS Take 500 mg by mouth 3 (three) times daily.  . memantine (NAMENDA) 10 MG tablet Take 10 mg by mouth 2 (two) times daily.  . metFORMIN (GLUCOPHAGE-XR) 500 MG 24 hr tablet Takes 2 tablets  2 x /day  with Meals  for Diabetes (Patient taking differently: Take 1,000 mg by mouth 2 (two) times daily.)  . metoprolol tartrate (LOPRESSOR) 25 MG tablet Take 1 tablet (25 mg total) by mouth 2 (two) times daily.  . Misc Natural Products (GLUCOS-CHONDROIT-MSM COMPLEX) TABS Take 2 tablets by mouth 3 (three) times daily.  . montelukast (SINGULAIR) 10 MG tablet TAKE 1 TABLET BY MOUTH DAILY FOR ALLERGIES  . montelukast (SINGULAIR) 10 MG tablet TAKE 1 TABLET BY MOUTH DAILY FOR ALLERGIES  . Multiple Vitamins-Minerals (MULTIVITAMIN WITH MINERALS) tablet Take 1 tablet by mouth daily.  . Multiple Vitamins-Minerals (PRESERVISION AREDS 2) CAPS Take 1 capsule by mouth 2 (two) times daily.  [name] oxymetazoline (AFRIN) 0.05 % nasal spray Place 1 spray into both nostrils at bedtime.  . pravastatin (PRAVACHOL) 40 MG tablet TAKE 1 TABLET BY MOUTH AT BEDTIME FOR CHOLESTEROL (Patient taking differently: Take 40 mg by mouth daily.)  . pregabalin (LYRICA) 150 MG capsule Take  1 capsule  3 x /day for Chronic Pain                                          /                           TAKE                              BY               MOUTH (Patient taking differently: Take 150 mg by mouth 3 (three) times daily.)  . PROBIOTIC PRODUCT PO Take 1 capsule by mouth 2 (two) times daily. VSL  . promethazine-dextromethorphan (PROMETHAZINE-DM) 6.25-15 MG/5ML syrup Take  1 teaspoon (5 ml)  every 4 hours  as needed  for Cough                                   /                              TAKE            BY                MOUTH (Patient taking differently: Take 5 mLs by mouth 4 (four) times daily as needed for cough.)  . Semaglutide,0.25 or 0.'5MG'$ /DOS, 2 MG/3ML SOPN Inject 0.25 mg into the skin once a week.  . sertraline (ZOLOFT) 100 MG tablet Take 100 mg by mouth 2 (two) times daily.  . sodium chloride (OCEAN) 0.65 % SOLN nasal spray Place 1 spray into both nostrils 4 (four) times daily as needed for congestion. Uses each time before other nasal sprays  . tadalafil (CIALIS) 5 MG tablet Take 5 mg by mouth daily.   . trospium (SANCTURA) 20 MG tablet Take 20 mg by mouth 2 (two) times daily. In the evening and at bedtime  .  TURMERIC PO Take 2 tablets by mouth 3 (three) times daily.  . VENTOLIN HFA 108 (90 Base) MCG/ACT inhaler INHALE 2 PUFFS BY MOUTH EVERY 4 HOURS AS NEEDED FOR WHEEZE OR FOR SHORTNESS OF BREATH (Patient taking differently: Inhale 2 puffs into the lungs every 4 (four) hours as needed for wheezing or shortness of breath.)  . zinc gluconate 50 MG tablet Take 50 mg by mouth 3 (three) times daily.  . potassium chloride SA (KLOR-CON M) 20 MEQ tablet Take 2 tablets (40 mEq total) by mouth 2 (two) times daily.   No current facility-administered medications on file prior to visit.    ROS: all negative except what is noted in the HPI.   ROS   Physical Exam:  BP 136/86   Pulse 90   Temp (!) 97.1 F (36.2 C)   Wt (!) 374 lb 6.4 oz (169.8 kg)   SpO2 91%   BMI 50.78 kg/m   General Appearance: NAD.  Awake, conversant and cooperative. Eyes: PERRLA, EOMs  intact.  Sclera white.  Conjunctiva without erythema. Sinuses: No frontal/maxillary tenderness.  No nasal discharge. Nares patent.  ENT/Mouth: Ext aud canals clear.  Bilateral TMs w/DOL and without erythema or bulging. Hearing intact.  Posterior pharynx without swelling or exudate.  Tonsils without swelling or erythema.  Neck: Supple.  No masses, nodules or thyromegaly. Respiratory: Effort is regular with non-labored breathing. Breath sounds are equal bilaterally without rales, rhonchi, wheezing or stridor.  Cardio: RRR with no MRGs. Brisk peripheral pulses without edema.  Abdomen: Active BS in all four quadrants.  Soft and non-tender without guarding, rebound tenderness, hernias or masses. Lymphatics: Non tender without lymphadenopathy.  Musculoskeletal: Full ROM, 5/5 strength, normal ambulation.  No clubbing or cyanosis. Skin: Appropriate color for ethnicity. Warm without rashes, lesions, ecchymosis, ulcers.  Neuro: CN II-XII grossly normal. Normal muscle tone without cerebellar symptoms and intact sensation.   Psych: AO X 3,  appropriate mood and affect, insight and judgment.     Darrol Jump, NP 4:03 PM [facility]: [facility]

## 2021-11-13 NOTE — Patient Instructions (Signed)
Acute Respiratory Failure, Adult Acute respiratory failure is a condition that is a medical emergency. It can develop quickly, and it should be treated right away. There are two types of acute respiratory failure: Type I respiratory failure is when the lungs are not able to get enough oxygen into the blood. This causes the blood oxygen level to drop. Type II respiratory failure is when carbon dioxide is not passing from the lungs out of the body. This causes carbon dioxide to build up in the blood. A person may have one type of acute respiratory failure or have both types at the same time. What are the causes? Common causes of type I respiratory failure include: Trauma to the lung, chest, ribs, or tissues around the lung. Pneumonia. Lung diseases, such as pulmonary fibrosis or asthma. Smoke, chemical, or water inhalation. A blood clot in the lungs (pulmonary embolism). A blood infection (sepsis). Heart attack. Common causes of type II respiratory failure include: Stroke. A spinal cord injury. A drug or alcohol overdose. A blood infection (sepsis). Cardiac arrest. What increases the risk? This condition is more likely to develop in people who have: Lung diseases such as asthma or chronic obstructive pulmonary disease (COPD). A condition that damages or weakens the muscles, nerves, bones, or tissues that are involved in breathing, such as myasthenia gravis or Guillain-Barr syndrome. A serious infection. A health problem that blocks the unconscious reflex that is involved in breathing, such as hypothyroidism or sleep apnea. What are the signs or symptoms? Trouble breathing is the main symptom of acute respiratory failure. Symptoms may also include: Fast breathing. Restlessness or anxiety. Breathing loudly (wheezing) and grunting. Fast or irregular heartbeats (palpitations). Confusion or changes in behavior. Feeling tired (fatigue), sleeping more than normal, or being hard to  wake. Skin, lips, or fingernails that appear blue (cyanosis). How is this diagnosed? This condition may be diagnosed based on: Your medical history and a physical exam. Your health care provider will listen to your heart and lungs to check for abnormal sounds. Tests to confirm the diagnosis and determine the cause of respiratory failure. These tests may include: Measuring the amount of oxygen in your blood (pulse oximetry). The measurement comes from a small device that is placed on your finger, earlobe, or toe. Blood tests to measure blood oxygen and carbon dioxide and to look for signs of infection. Tests on a sample of the fluid that surrounds the spinal cord (cerebrospinal fluid) or a sample of fluid that is drawn from the windpipe (trachea) to check for infections. Chest X-ray. Electrocardiogram (ECG) to look at the heart's electrical activity. How is this treated? Treatment for this condition usually takes place in a hospital intensive care unit (ICU). Treatment depends on what is causing the condition. It may include one or more of these treatments: Oxygen may be given through your nose or a face mask. A device such as a continuous positive airway pressure (CPAP) machine or bi-level positive airway pressure (BIPAP) machine may be used to help you breathe. The device gives you oxygen and pressure. Breathing treatments, fluids, and other medicines may be given. A ventilator may be used to help you breathe. The machine gives you oxygen and pressure. A tube is put into your mouth and trachea to connect the ventilator. If this treatment is needed longer term, a tracheostomy may be placed. A tracheostomy is a breathing tube put through your neck into your trachea. In extreme cases, extracorporeal life support (ECLS) may be used. This treatment   temporarily takes over the function of the heart and lungs, supplying oxygen and removing carbon dioxide. ECLS gives the lungs a chance to recover. Follow  these instructions at home: Medicines Take over-the-counter and prescription medicines only as told by your health care provider. If you were prescribed an antibiotic medicine, take it as told by your health care provider. Do not stop using the antibiotic even if you start to feel better. If you are taking blood thinners: Talk with your health care provider before you take any medicines that contain aspirin or NSAIDs, such as ibuprofen. These medicines increase your risk for dangerous bleeding. Take your medicine exactly as told, at the same time every day. Avoid activities that could cause injury or bruising, and follow instructions about how to prevent falls. Wear a medical alert bracelet or carry a card that lists what medicines you take. General instructions Return to your normal activities as told by your health care provider. Ask your health care provider what activities are safe for you. Do not use any products that contain nicotine or tobacco, such as cigarettes, e-cigarettes, and chewing tobacco. If you need help quitting, ask your health care provider. Do not drink alcohol if: Your health care provider tells you not to drink. You are pregnant, may be pregnant, or are planning to become pregnant. Wear compression stockings as told by your health care provider. These stockings help to prevent blood clots and reduce swelling in your legs. Attend any physical therapy and pulmonary rehabilitation as told by your health care provider. Keep all follow-up visits as told by your health care provider. This is important. How is this prevented? If you have an infection or a medical condition that may lead to acute respiratory failure, make sure you get proper treatment. Contact a health care provider if: You have a fever. Your symptoms do not improve or they get worse. Get help right away if: You are having trouble breathing. You lose consciousness. You develop a fast heart rate. Your  fingers, lips, or other areas turn blue. You are confused. These symptoms may represent a serious problem that is an emergency. Do not wait to see if the symptoms will go away. Get medical help right away. Call your local emergency services (911 in the [place] Do not drive yourself to the hospital. Summary Acute respiratory failure is a medical emergency. It can develop quickly, and it should be treated right away. Treatment for this condition usually takes place in a hospital intensive care unit (ICU). Treatment may include oxygen, fluids, and medicines. A device may be used to help you breathe, such as a ventilator. Take over-the-counter and prescription medicines only as told by your health care provider. Contact a health care provider if your symptoms do not improve or if they get worse. This information is not intended to replace advice given to you by your health care provider. Make sure you discuss any questions you have with your health care provider. Document Revised: 12/19/2020 Document Reviewed: 04/29/2019 Elsevier Patient Education  2023 [employer] Inc[employer]  

## 2021-11-14 ENCOUNTER — Ambulatory Visit (INDEPENDENT_AMBULATORY_CARE_PROVIDER_SITE_OTHER): Payer: BC Managed Care – PPO | Admitting: Pulmonary Disease

## 2021-11-14 ENCOUNTER — Encounter: Payer: Self-pay | Admitting: Pulmonary Disease

## 2021-11-14 VITALS — BP 130/72 | HR 86 | Temp 98.1°F | Ht 72.0 in | Wt 375.6 lb

## 2021-11-14 DIAGNOSIS — R911 Solitary pulmonary nodule: Secondary | ICD-10-CM | POA: Diagnosis not present

## 2021-11-14 DIAGNOSIS — J9611 Chronic respiratory failure with hypoxia: Secondary | ICD-10-CM | POA: Diagnosis not present

## 2021-11-14 DIAGNOSIS — G4733 Obstructive sleep apnea (adult) (pediatric): Secondary | ICD-10-CM | POA: Diagnosis not present

## 2021-11-14 DIAGNOSIS — J449 Chronic obstructive pulmonary disease, unspecified: Secondary | ICD-10-CM | POA: Diagnosis not present

## 2021-11-14 DIAGNOSIS — J189 Pneumonia, unspecified organism: Secondary | ICD-10-CM | POA: Diagnosis not present

## 2021-11-14 NOTE — Progress Notes (Unsigned)
Mark Harrell    [id number]    07-01-1960  Primary Care Physician:McKeown, Gwyndolyn Saxon, MD  Referring Physician: [name], MD [address] [place],  [place] [address]  Chief complaint: Follow-up for asthma, COPD overlap syndrome obstructive sleep apnea, hypoxic respiratory failure  HPI: 61 y.o. with past medical history of asthma, COPD, OSA, hypoxic respiratory failure Is a former patient of Dr. Ashok Cordia Being treated with trelegy.  Previously on Nucala for asthma in 2018 which did not improve symptoms He has been switched to [place] on January 2019.  Follows with allergy and immunology  On CPAP for sleep apnea.  He has been having issues with nasal congestion for the past few months and has stopped using the Pap therapy Follows with ENT, Dr. Lucia Gaskins and has been told that he may need sinus surgery.  Pets: No pets Occupation: Used to work in Equities trader, [employer].   Exposures: No known exposures, no mold, hot tub, leaks Smoking history: Never smoker.  Exposed to secondhand smoke Travel history: Previously lived in [place], [place], [place], [place].  No recent travel.  Interim history: He had a complicated clinical course in 2023.  Developed COVID-pneumonia in January 2023 and had worsening respiratory status since then.  He had bilateral pneumonia on CT scan in April 2023.  Failed outpatient cefepime and Levaquin and admitted on 09/24/2021 for IV antibiotics.  Also noted to have DVT, no PE and started on Eliquis anticoagulation.  Hospitalized again within a few days on 10/03/2021 with acute on chronic respiratory failure left lower lobe pneumonia.  Placed on IV steroids, empiric antibiotics and treated for COPD exacerbation.  He had a CT during this admission which showed worsening nodular infiltrates at the base  Overall he is improved since admission.  Denies any cough, sputum production, fevers or chills.  Outpatient Encounter Medications as of  11/14/2021  Medication Sig   acetaminophen (TYLENOL) 500 MG tablet Take 1,000 mg by mouth 3 (three) times daily.   anastrozole (ARIMIDEX) 1 MG tablet Take 1 mg by mouth daily.   apixaban (ELIQUIS) 5 MG TABS tablet Take 1 tablet (5 mg total) by mouth 2 (two) times daily.   ascorbic acid (VITAMIN C) 1000 MG tablet Take 1,000 mg by mouth every evening.   aspirin EC 81 MG tablet Take 81 mg by mouth daily. Swallow whole.   Azelastine HCl 137 MCG/SPRAY SOLN Place 2 sprays into both nostrils 2 (two) times daily. USE 1 TO 2 SPRAYS EACH NOSTRIL 2 X /DAY (Patient taking differently: Place 2 sprays into both nostrils 2 (two) times daily.)   benzonatate (TESSALON) 200 MG capsule TAKE 1 CAPSULE BY MOUTH 3 TIMES A DAY TO PREVENT COUGH   Botulinum Toxin Type A 200 units SOLR Inject 200 Units into the skin every 3 (three) months.   budesonide (PULMICORT) 0.5 MG/2ML nebulizer solution Take 2 mLs (0.5 mg total) by nebulization 3 (three) times daily as needed. (Patient taking differently: Take 0.5 mg by nebulization 2 (two) times daily.)   Calcium Carb-Cholecalciferol (CALCIUM + VITAMIN D3 PO) Take 1 tablet by mouth daily.   cetaphil (CETAPHIL) lotion Apply topically 2 (two) times daily. (Patient taking differently: Apply 1 application  topically 2 (two) times daily.)   cetirizine (ZYRTEC) 10 MG tablet Take 1 tablet (10 mg total) by mouth 2 (two) times daily as needed for allergies (Can take an extra dose during flares). (Patient taking differently: Take 10 mg by mouth in  the morning.)   Cholecalciferol (VITAMIN D3) 125 MCG (5000 UT) TABS Take 10,000 Units by mouth 2 (two) times daily. Morning and evening   Cinnamon 500 MG capsule Take 1,000 mg by mouth 3 (three) times daily.   Coenzyme Q10 (CO Q 10 PO) Take 600 mg by mouth daily.   cyclobenzaprine (FLEXERIL) 10 MG tablet TAKE 1/2 TO 1 TABLET 3 X DAY ONLY IF NEED FOR MUSCLE SPASM (NECK)   diclofenac Sodium (VOLTAREN) 1 % GEL APPLY 2 TO 4 GRAMS TOPICALLY 2 TO 4 TIMES  DAILY FOR PAIN & INFLAMMATION( OTC NOT COVERED) (Patient taking differently: 2-4 g 2 (two) times daily as needed (pain).)   DM-APAP-CPM (CORICIDIN HBP MAX STRENGTH FLU PO) Take 1 tablet by mouth 3 (three) times daily.   donepezil (ARICEPT) 23 MG TABS tablet Take 23 mg by mouth daily.   doxycycline (ADOXA) 100 MG tablet Take 100 mg by mouth 2 (two) times daily. prophylactic   dupilumab (DUPIXENT) 300 MG/2ML prefilled syringe Inject 300 mg into the skin every 14 (fourteen) days.   EPINEPHrine 0.3 mg/0.3 mL IJ SOAJ injection Inject 0.3 mg into the muscle as needed. (Patient taking differently: Inject 0.3 mg into the muscle as needed for anaphylaxis.)   fluticasone (FLONASE) 50 MCG/ACT nasal spray Place 2 sprays into both nostrils daily. Use 1 to 2 sprays each Nostril 2 x /day (Patient taking differently: Place 2 sprays into both nostrils in the morning and at bedtime.)   Fluticasone-Umeclidin-Vilant (TRELEGY ELLIPTA) 100-62.5-25 MCG/ACT AEPB Inhale 1 puff into the lungs daily.   furosemide (LASIX) 80 MG tablet Take 80 mg by mouth 2 (two) times daily.   Guaifenesin 1200 MG TB12 Take 1,200 mg by mouth 3 (three) times daily.   ipratropium-albuterol (DUONEB) 0.5-2.5 (3) MG/3ML SOLN TAKE 3 MLS BY NEBULIZATION EVERY 4 (FOUR) HOURS AS NEEDED. (Patient taking differently: Take 6 mLs by nebulization every 4 (four) hours as needed (shortess of breath).)   Krill Oil 500 MG CAPS Take 500 mg by mouth 2 (two) times daily.   Magnesium 500 MG CAPS Take 500 mg by mouth 3 (three) times daily.   memantine (NAMENDA) 10 MG tablet Take 10 mg by mouth 2 (two) times daily.   metFORMIN (GLUCOPHAGE-XR) 500 MG 24 hr tablet Takes 2 tablets  2 x /day  with Meals  for Diabetes (Patient taking differently: Take 1,000 mg by mouth 2 (two) times daily.)   metoprolol tartrate (LOPRESSOR) 25 MG tablet Take 1 tablet (25 mg total) by mouth 2 (two) times daily.   Misc Natural Products (GLUCOS-CHONDROIT-MSM COMPLEX) TABS Take 2 tablets by  mouth 3 (three) times daily.   montelukast (SINGULAIR) 10 MG tablet TAKE 1 TABLET BY MOUTH DAILY FOR ALLERGIES   montelukast (SINGULAIR) 10 MG tablet TAKE 1 TABLET BY MOUTH DAILY FOR ALLERGIES   Multiple Vitamins-Minerals (MULTIVITAMIN WITH MINERALS) tablet Take 1 tablet by mouth daily.   Multiple Vitamins-Minerals (PRESERVISION AREDS 2) CAPS Take 1 capsule by mouth 2 (two) times daily.   oxymetazoline (AFRIN) 0.05 % nasal spray Place 1 spray into both nostrils at bedtime.   pravastatin (PRAVACHOL) 40 MG tablet TAKE 1 TABLET BY MOUTH AT BEDTIME FOR CHOLESTEROL (Patient taking differently: Take 40 mg by mouth daily.)   pregabalin (LYRICA) 150 MG capsule Take  1 capsule  3 x /day for Chronic Pain                                          /  TAKE                              BY               MOUTH (Patient taking differently: Take 150 mg by mouth 3 (three) times daily.)   PROBIOTIC PRODUCT PO Take 1 capsule by mouth 2 (two) times daily. VSL   promethazine-dextromethorphan (PROMETHAZINE-DM) 6.25-15 MG/5ML syrup Take  1 teaspoon (5 ml)  every 4 hours  as needed  for Cough                                   /                              TAKE            BY                MOUTH (Patient taking differently: Take 5 mLs by mouth 4 (four) times daily as needed for cough.)   Semaglutide,0.25 or 0.'5MG'$ /DOS, 2 MG/3ML SOPN Inject 0.25 mg into the skin once a week.   sertraline (ZOLOFT) 100 MG tablet Take 100 mg by mouth 2 (two) times daily.   sodium chloride (OCEAN) 0.65 % SOLN nasal spray Place 1 spray into both nostrils 4 (four) times daily as needed for congestion. Uses each time before other nasal sprays   tadalafil (CIALIS) 5 MG tablet Take 5 mg by mouth daily.    trospium (SANCTURA) 20 MG tablet Take 20 mg by mouth 2 (two) times daily. In the evening and at bedtime   TURMERIC PO Take 2 tablets by mouth 3 (three) times daily.   VENTOLIN HFA 108 (90 Base) MCG/ACT inhaler INHALE 2 PUFFS BY  MOUTH EVERY 4 HOURS AS NEEDED FOR WHEEZE OR FOR SHORTNESS OF BREATH (Patient taking differently: Inhale 2 puffs into the lungs every 4 (four) hours as needed for wheezing or shortness of breath.)   zinc gluconate 50 MG tablet Take 50 mg by mouth 3 (three) times daily.   potassium chloride SA (KLOR-CON M) 20 MEQ tablet Take 2 tablets (40 mEq total) by mouth 2 (two) times daily.   No facility-administered encounter medications on file as of 11/14/2021.    Physical Exam: Blood pressure 128/84, pulse 70, height 6' (1.829 m), weight (!) 406 lb (184.2 kg), SpO2 94 %. Gen:      No acute distress HEENT:  EOMI, sclera anicteric Neck:     No masses; no thyromegaly Lungs:    Clear to auscultation bilaterally; normal respiratory effort CV:         Regular rate and rhythm; no murmurs Abd:      + bowel sounds; soft, non-tender; no palpable masses, no distension Ext:    No edema; adequate peripheral perfusion Skin:      Warm and dry; no rash Neuro: alert and oriented x 3 Psych: normal mood and affect  Data Reviewed: IMAGING CT chest 01/04/2017- no PE, lungs are clear. CTA 09/03/2021-patchy nodular airspace disease in the lower lobes CTA 09/25/2021 bilateral lower lobe airspace disease increased in severity High-res CT 10/04/2021 bilateral nodules and masslike opacities in the lower lobes increased in size compared to April, dilated pulmonary artery.  PFTs 12/28/2017 FVC 3.41 [65%), FEV1 2.69 [67%], F/F 79, TLC 79%, DLCO  94% Minimal obstruction, mild restriction  FENO 12/28/2017-10  6MWT 03/09/17:  Walked 2 laps / Baseline Sat 97% on RA / Nadir Sat 97% on RA (walked at a slow pace with a cane - stopped due to dyspnea) 05/08/16:  Walked 321 meters / Baseline Sat 93% on RA / Nadir Sat 93% on RA @ rest   Sleep: SPLIT NIGHT SLEEP STUDY (05/07/16): Severe obstructive sleep apnea with AHI 65.7 events/hour. No significant central sleep apnea occurred. Severe desaturation during diagnostic portion to 72% on room  air. No cardiac abnormalities or periodic limb movements were noted. Optimal CPAP pressure was 18 cm H2O with a large-sized [employer] Full Face Mask Simplus mask. Reading physician recommended AutoPap at 10-18 cm H2O pressure.    ESOPHAGRAM/BARIUM SWALLOW 04/15/16 (per radiologist): Mild nonspecific esophageal dysmotility disorder. Episode of flash laryngeal penetration with swallowing. Small sliding type I hiatal hernia. No visible mass, erosion, or ulcerations.   LABS 04/10/16 Alpha-1 antitrypsin: MM (135)     11/02/13 ANA:  Negative DS DNA Ab:  <1 SSA:  <1.0 SSB:  <1.0   02/24/11 ACE:  35 RF:  <10  Assessment:  Post COVID-19, recurrent pneumonia He had COVID-19 in January 2023 and recurrent attacks of pneumonia last month I have reviewed his recent CT scans which shows worsening lower lobe airspace disease.  His latest high-res CT shows nodular consolidation which looks like organizing pneumonia.  There could be underlying malignancy but the CT was done in the context of his recurrent admissions with pneumonia Overall he seems to be improved clinically We will reassess with a super D CT and determine if we need to pursue bronchoscopy/biopsy.  Asthma, COPD overlap syndrome PFTs reviewed with minimal obstruction.   Continue on Trelegy inhaler and dupixent  OSA Noncompliant CPAP due to sinus congestion. Encouraged resumption of therapy  GERD Continue Zantac.  Plan/Recommendations: - Follow up CT - Continue Trelegy, Dupixent - Resume CPAP as soon as possible  Marshell Garfinkel MD [facility] 11/14/2021, 10:21 AM  CC: [name], MD

## 2021-11-14 NOTE — Patient Instructions (Signed)
We will get a super D CT for evaluation of the lung Based on these results and may discuss with my colleagues and come up with a plan for biopsy Follow-up in 2 to 4 weeks

## 2021-11-20 ENCOUNTER — Encounter: Payer: Self-pay | Admitting: Pulmonary Disease

## 2021-11-27 ENCOUNTER — Telehealth: Payer: Self-pay | Admitting: Physical Medicine and Rehabilitation

## 2021-11-30 ENCOUNTER — Other Ambulatory Visit: Payer: Self-pay | Admitting: Allergy & Immunology

## 2021-12-08 ENCOUNTER — Other Ambulatory Visit: Payer: Self-pay | Admitting: Allergy & Immunology

## 2021-12-09 ENCOUNTER — Other Ambulatory Visit: Payer: Self-pay | Admitting: Allergy & Immunology

## 2021-12-09 ENCOUNTER — Other Ambulatory Visit: Payer: Self-pay | Admitting: Nurse Practitioner

## 2021-12-09 DIAGNOSIS — S161XXD Strain of muscle, fascia and tendon at neck level, subsequent encounter: Secondary | ICD-10-CM

## 2021-12-11 ENCOUNTER — Ambulatory Visit: Payer: BC Managed Care – PPO | Admitting: Nurse Practitioner

## 2021-12-11 ENCOUNTER — Encounter: Payer: Self-pay | Admitting: Nurse Practitioner

## 2021-12-11 VITALS — BP 142/88 | HR 91 | Temp 97.7°F | Ht 72.0 in | Wt 373.0 lb

## 2021-12-11 DIAGNOSIS — U099 Post covid-19 condition, unspecified: Secondary | ICD-10-CM

## 2021-12-11 DIAGNOSIS — Z91038 Other insect allergy status: Secondary | ICD-10-CM | POA: Diagnosis not present

## 2021-12-11 DIAGNOSIS — J449 Chronic obstructive pulmonary disease, unspecified: Secondary | ICD-10-CM | POA: Diagnosis not present

## 2021-12-11 DIAGNOSIS — R053 Chronic cough: Secondary | ICD-10-CM

## 2021-12-11 DIAGNOSIS — Z789 Other specified health status: Secondary | ICD-10-CM

## 2021-12-11 DIAGNOSIS — B999 Unspecified infectious disease: Secondary | ICD-10-CM

## 2021-12-11 NOTE — Progress Notes (Signed)
Assessment and Plan:  Mark Harrell. was seen today for a follow up.  Diagnoses and all order for this visit:  1. [facility] following. Continue to monitor  2. Recurrent infections Consider starting immunoglobulin replacement in the future. Asthma-Allergy following  3. Asthma-COPD overlap syndrome (HCC) Continue medications inhalers, duonebs, singular  4. Chronic allergic rhinitis Continue medications flonase, zyrtec, mucinex  5. Hymenoptera allergy Continue to monitor   6. Chronic cough Continue medications. Continue Pulmonology  7. Decreased activities of daily living (ADL)  - Ambulatory referral to [facility] to monitor for any increase in fever, chills, N/V, diarrhea, changes to bowel habits, blood in stool.  Notify office for further evaluation and treatment, questions or concerns if s/s fail to improve. The risks and benefits of my recommendations, as well as other treatment options were discussed with the patient today. Questions were answered.  Further disposition pending results of labs. Discussed med's effects and SE's.    Over 20 minutes of exam, counseling, chart review, and critical decision making was performed.   Future Appointments  Date Time Provider [place]  12/12/2021  3:20 PM GI-315 CT 1 GI-315CT [facility] [facility]  12/16/2021  4:00 PM Clayton Bibles, NP [facility] None  12/30/2021  3:00 PM Magnus Sinning, MD [facility] None  12/31/2021  2:45 PM [place], Max T, DPM [name]  01/02/2022  4:00 PM [name], MD [name]  02/18/2022  3:00 PM [name], MD [name]    ------------------------------------------------------------------------------------------------------------------   HPI BP (!) 142/88   Pulse 91   Temp 97.7 F (36.5 C)   Ht 6' (1.829 m)   Wt (!) 373 lb (169.2 kg)   SpO2 91%   BMI 50.59 kg/m   60 y.o.male presents for a general 1 month  follow up.  He reports doing better since last OV, however, continues to have shortness of breath.  He plans to follow up with Dr. [name], Pulmonology tomorrow for evaluation of lung nodules and to complete a bronchoscopy.   He is also being considered starting immunoglobulin replacement  with asthma and allergy in the future due to continuing multiple infections.    He is continuing Trelegy, Albuterol, Budesonide duoneb, Zyrtec, Flonase, Singulair, Mucinex  He is currently on 3L O2 vis [place] with activity and 2L O2 continuous. Not wearing in office today.  He is continuing to have have trouble with ADLs including bathing.  He was set up with [facility] after his last hospital discharge but reports his has failed through and he does not have any assistance in the home.    Past Medical History:  Diagnosis Date   Anesthesia complication requiring reversal agent administration    ? from central apnea, very difficult to get off vent   Anxiety    Arthritis    osteo   Asthma    BPH (benign prostatic hyperplasia)    Complication of anesthesia    difficulty waking , they twlight me because of my respiratory problems "   Depression    Dyspnea    on exertion   Enlarged heart    Family history of adverse reaction to anesthesia    mother trouble waking up, and heart stopped   GERD (gastroesophageal reflux disease)    Headache    botox injections for headaches   Hyperlipidemia    Hypertension    Hypogonadism male    IBS (irritable bowel syndrome)  Memory difficulties    short term memories   Neuropathy    Obesity    On home oxygen therapy    on 2 liter   OSA (obstructive sleep apnea)    not using cpap   Paralysis (HCC)    left hand - small   Pneumonia    Pre-diabetes    Prostatitis      Allergies  Allergen Reactions   Bee Venom Swelling   Cymbalta [Duloxetine Hcl] Shortness Of Breath    Brought on asthma   Ppd [Tuberculin Purified Protein Derivative] Other (See Comments)    +ppd NEG  Quantferron Gold 3/13 (shows false positive)    Calan [Verapamil] Other (See Comments)    Back pain   Tricor [Fenofibrate] Other (See Comments)    Back pain   Claritin [Loratadine] Other (See Comments)    Unknown reaction    Levaquin [Levofloxacin] Diarrhea    Current Outpatient Medications on File Prior to Visit  Medication Sig   acetaminophen (TYLENOL) 500 MG tablet Take 1,000 mg by mouth 3 (three) times daily.   anastrozole (ARIMIDEX) 1 MG tablet Take 1 mg by mouth daily.   ascorbic acid (VITAMIN C) 1000 MG tablet Take 1,000 mg by mouth every evening.   aspirin EC 81 MG tablet Take 81 mg by mouth daily. Swallow whole.   Azelastine HCl 137 MCG/SPRAY SOLN USE 1 TO 2 SPRAYS EACH NOSTRIL 2 X /DAY   benzonatate (TESSALON) 200 MG capsule TAKE 1 CAPSULE BY MOUTH 3 TIMES A DAY TO PREVENT COUGH   Botulinum Toxin Type A 200 units SOLR Inject 200 Units into the skin every 3 (three) months.   budesonide (PULMICORT) 0.5 MG/2ML nebulizer solution Take 2 mLs (0.5 mg total) by nebulization 3 (three) times daily as needed. (Patient taking differently: Take 0.5 mg by nebulization 2 (two) times daily.)   Calcium Carb-Cholecalciferol (CALCIUM + VITAMIN D3 PO) Take 1 tablet by mouth daily.   cetaphil (CETAPHIL) lotion Apply topically 2 (two) times daily. (Patient taking differently: Apply 1 application  topically 2 (two) times daily.)   cetirizine (ZYRTEC) 10 MG tablet Take 1 tablet (10 mg total) by mouth 2 (two) times daily as needed for allergies (Can take an extra dose during flares). (Patient taking differently: Take 10 mg by mouth in the morning.)   Cholecalciferol (VITAMIN D3) 125 MCG (5000 UT) TABS Take 10,000 Units by mouth 2 (two) times daily. Morning and evening   Cinnamon 500 MG capsule Take 1,000 mg by mouth 3 (three) times daily.   Coenzyme Q10 (CO Q 10 PO) Take 600 mg by mouth daily.   cyclobenzaprine (FLEXERIL) 10 MG tablet TAKE 1/2 TO 1 TABLET 3 X DAY ONLY IF NEED FOR MUSCLE SPASM (NECK)    diclofenac Sodium (VOLTAREN) 1 % GEL APPLY 2 TO 4 GRAMS TOPICALLY 2 TO 4 TIMES DAILY FOR PAIN & INFLAMMATION( OTC NOT COVERED) (Patient taking differently: 2-4 g 2 (two) times daily as needed (pain).)   DM-APAP-CPM (CORICIDIN HBP MAX STRENGTH FLU PO) Take 1 tablet by mouth 3 (three) times daily.   donepezil (ARICEPT) 23 MG TABS tablet Take 23 mg by mouth daily.   doxycycline (ADOXA) 100 MG tablet Take 100 mg by mouth 2 (two) times daily. prophylactic   dupilumab (DUPIXENT) 300 MG/2ML prefilled syringe Inject 300 mg into the skin every 14 (fourteen) days.   EPINEPHrine 0.3 mg/0.3 mL IJ SOAJ injection Inject 0.3 mg into the muscle as needed. (Patient taking differently: Inject 0.3 mg into  the muscle as needed for anaphylaxis.)   fluticasone (FLONASE) 50 MCG/ACT nasal spray PLACE 2 SPRAYS INTO BOTH NOSTRILS DAILY. USE 1 TO 2 SPRAYS EACH NOSTRIL 2 X /DAY   Fluticasone-Umeclidin-Vilant (TRELEGY ELLIPTA) 100-62.5-25 MCG/ACT AEPB Inhale 1 puff into the lungs daily.   furosemide (LASIX) 80 MG tablet Take 80 mg by mouth 2 (two) times daily.   Guaifenesin 1200 MG TB12 Take 1,200 mg by mouth 3 (three) times daily.   ipratropium-albuterol (DUONEB) 0.5-2.5 (3) MG/3ML SOLN TAKE 3 MLS BY NEBULIZATION EVERY 4 (FOUR) HOURS AS NEEDED.   Krill Oil 500 MG CAPS Take 500 mg by mouth 2 (two) times daily.   Magnesium 500 MG CAPS Take 500 mg by mouth 3 (three) times daily.   memantine (NAMENDA) 10 MG tablet Take 10 mg by mouth 2 (two) times daily.   metFORMIN (GLUCOPHAGE-XR) 500 MG 24 hr tablet Takes 2 tablets  2 x /day  with Meals  for Diabetes (Patient taking differently: Take 1,000 mg by mouth 2 (two) times daily.)   metoprolol tartrate (LOPRESSOR) 25 MG tablet Take 1 tablet (25 mg total) by mouth 2 (two) times daily.   Misc Natural Products (GLUCOS-CHONDROIT-MSM COMPLEX) TABS Take 2 tablets by mouth 3 (three) times daily.   montelukast (SINGULAIR) 10 MG tablet TAKE 1 TABLET BY MOUTH DAILY FOR ALLERGIES   montelukast  (SINGULAIR) 10 MG tablet TAKE 1 TABLET BY MOUTH DAILY FOR ALLERGIES   Multiple Vitamins-Minerals (MULTIVITAMIN WITH MINERALS) tablet Take 1 tablet by mouth daily.   Multiple Vitamins-Minerals (PRESERVISION AREDS 2) CAPS Take 1 capsule by mouth 2 (two) times daily.   oxymetazoline (AFRIN) 0.05 % nasal spray Place 1 spray into both nostrils at bedtime.   pravastatin (PRAVACHOL) 40 MG tablet TAKE 1 TABLET BY MOUTH AT BEDTIME FOR CHOLESTEROL (Patient taking differently: Take 40 mg by mouth daily.)   pregabalin (LYRICA) 150 MG capsule Take  1 capsule  3 x /day for Chronic Pain                                          /                          TAKE                              BY               MOUTH (Patient taking differently: Take 150 mg by mouth 3 (three) times daily.)   PROBIOTIC PRODUCT PO Take 1 capsule by mouth 2 (two) times daily. VSL   promethazine-dextromethorphan (PROMETHAZINE-DM) 6.25-15 MG/5ML syrup Take  1 teaspoon (5 ml)  every 4 hours  as needed  for Cough                                   /                              TAKE            BY                MOUTH (Patient taking differently: Take 5 mLs by mouth 4 (  four) times daily as needed for cough.)   sertraline (ZOLOFT) 100 MG tablet Take 100 mg by mouth 2 (two) times daily.   sodium chloride (OCEAN) 0.65 % SOLN nasal spray Place 1 spray into both nostrils 4 (four) times daily as needed for congestion. Uses each time before other nasal sprays   tadalafil (CIALIS) 5 MG tablet Take 5 mg by mouth daily.    trospium (SANCTURA) 20 MG tablet Take 20 mg by mouth 2 (two) times daily. In the evening and at bedtime   TURMERIC PO Take 2 tablets by mouth 3 (three) times daily.   VENTOLIN HFA 108 (90 Base) MCG/ACT inhaler INHALE 2 PUFFS BY MOUTH EVERY 4 HOURS AS NEEDED FOR WHEEZE OR FOR SHORTNESS OF BREATH (Patient taking differently: Inhale 2 puffs into the lungs every 4 (four) hours as needed for wheezing or shortness of breath.)   zinc gluconate 50  MG tablet Take 50 mg by mouth 3 (three) times daily.   apixaban (ELIQUIS) 5 MG TABS tablet Take 1 tablet (5 mg total) by mouth 2 (two) times daily. (Patient not taking: Reported on 12/11/2021)   potassium chloride SA (KLOR-CON M) 20 MEQ tablet Take 2 tablets (40 mEq total) by mouth 2 (two) times daily.   Semaglutide,0.25 or 0.'5MG'$ /DOS, 2 MG/3ML SOPN Inject 0.25 mg into the skin once a week. (Patient not taking: Reported on 12/11/2021)   No current facility-administered medications on file prior to visit.    ROS: all negative except what is noted in the HPI.    Physical Exam:  BP (!) 142/88   Pulse 91   Temp 97.7 F (36.5 C)   Ht 6' (1.829 m)   Wt (!) 373 lb (169.2 kg)   SpO2 91%   BMI 50.59 kg/m   General Appearance: NAD.  Awake, conversant and cooperative. Eyes: PERRLA, EOMs intact.  Sclera white.  Conjunctiva without erythema. Sinuses: No frontal/maxillary tenderness.  No nasal discharge. Nares patent.  ENT/Mouth: Ext aud canals clear.  Bilateral TMs w/DOL and without erythema or bulging. Hearing intact.  Posterior pharynx without swelling or exudate.  Tonsils without swelling or erythema.  Neck: Supple.  No masses, nodules or thyromegaly. Respiratory: Clear. Effort is regular with non-labored breathing.   Cardio: RRR with no MRGs. Brisk peripheral pulses without edema.  Abdomen: Active BS in all four quadrants.  Soft and non-tender without guarding, rebound tenderness, hernias or masses. Lymphatics: Non tender without lymphadenopathy.  Musculoskeletal: Full ROM, 5/5 strength, normal ambulation.  No clubbing or cyanosis. Skin: Appropriate color for ethnicity. Warm without rashes, lesions, ecchymosis, ulcers.  Neuro: CN II-XII grossly normal. Normal muscle tone without cerebellar symptoms and intact sensation.   Psych: AO X 3,  appropriate mood and affect, insight and judgment.     Darrol Jump, NP 4:01 PM [facility]

## 2021-12-12 ENCOUNTER — Ambulatory Visit
Admission: RE | Admit: 2021-12-12 | Discharge: 2021-12-12 | Disposition: A | Discharge status: Home / Self Care | Payer: BC Managed Care – PPO | Source: Ambulatory Visit | Attending: Pulmonary Disease | Admitting: Pulmonary Disease

## 2021-12-12 ENCOUNTER — Other Ambulatory Visit: Payer: Self-pay | Admitting: Nurse Practitioner

## 2021-12-12 DIAGNOSIS — R911 Solitary pulmonary nodule: Secondary | ICD-10-CM

## 2021-12-12 DIAGNOSIS — R918 Other nonspecific abnormal finding of lung field: Secondary | ICD-10-CM | POA: Diagnosis not present

## 2021-12-12 DIAGNOSIS — S161XXD Strain of muscle, fascia and tendon at neck level, subsequent encounter: Secondary | ICD-10-CM

## 2021-12-13 ENCOUNTER — Encounter: Payer: Self-pay | Admitting: Nurse Practitioner

## 2021-12-13 DIAGNOSIS — S161XXD Strain of muscle, fascia and tendon at neck level, subsequent encounter: Secondary | ICD-10-CM

## 2021-12-13 MED ORDER — APIXABAN 5 MG PO TABS: 5.0000 mg | ORAL_TABLET | Freq: Two times a day (BID) | ORAL | 1 refills | Status: DC

## 2021-12-14 DIAGNOSIS — J449 Chronic obstructive pulmonary disease, unspecified: Secondary | ICD-10-CM | POA: Diagnosis not present

## 2021-12-14 DIAGNOSIS — J9611 Chronic respiratory failure with hypoxia: Secondary | ICD-10-CM | POA: Diagnosis not present

## 2021-12-16 ENCOUNTER — Ambulatory Visit: Payer: BC Managed Care – PPO | Admitting: Nurse Practitioner

## 2021-12-16 ENCOUNTER — Encounter: Payer: Self-pay | Admitting: Nurse Practitioner

## 2021-12-16 VITALS — BP 144/88 | HR 100 | Temp 99.1°F | Ht 72.0 in | Wt 371.6 lb

## 2021-12-16 DIAGNOSIS — J9611 Chronic respiratory failure with hypoxia: Secondary | ICD-10-CM | POA: Diagnosis not present

## 2021-12-16 DIAGNOSIS — J189 Pneumonia, unspecified organism: Secondary | ICD-10-CM | POA: Diagnosis not present

## 2021-12-16 DIAGNOSIS — J3089 Other allergic rhinitis: Secondary | ICD-10-CM | POA: Diagnosis not present

## 2021-12-16 DIAGNOSIS — J449 Chronic obstructive pulmonary disease, unspecified: Secondary | ICD-10-CM

## 2021-12-16 DIAGNOSIS — J4489 Other specified chronic obstructive pulmonary disease: Secondary | ICD-10-CM

## 2021-12-16 DIAGNOSIS — G4733 Obstructive sleep apnea (adult) (pediatric): Secondary | ICD-10-CM

## 2021-12-16 NOTE — Progress Notes (Unsigned)
$'@Patient'n$  ID: Mark Harrell., male    DOB: 1960/08/03, 61 y.o.   MRN: [id number]  Chief Complaint  Patient presents with   Follow-up    Patient says things are the same and he feels like he's slowly going back down to how he was when he was in the hospital    Referring provider: [name], MD  HPI: 61 year old male, former remote smoker followed for asthma/COPD overlap syndrome, OSA and oxygen dependent respiratory failure.  Previously on Nucala in 2018 and switched to [place] in January 2019.  Followed by asthma and immunology.  He was previously a patient of Dr. [name] and last seen in our office on 11/14/2021.   Patient had COVID-pneumonia in January 2023 with progressive dyspnea since.  He was treated for persistent symptoms with doxycycline and azithromycin.  He was then evaluated at the ED for hemoptysis and worsening cough and treated for pneumonia 09/03/2021 with Levaquin.  He had minimal improvement and ended up going to his PCP 4/19 after this and was treated with 15 days of Ceftin.  While on this, he was seen in the pulmonary clinic by [facility] NP.  During the visit, he noted that his sputum had become less and was more clear in color.  He did not have any wheezing and hemoptysis had mostly resolved.  Did note some increased bilateral lower extremity edema which is being managed by his PCP.  He was recommended to finish the Ceftin as previously prescribed with repeat CXR in 3 weeks to evaluate response to antibiotics.    He then went to the hospital on 09/24/2021 with worsening shortness of breath and increased leg swelling.  CXR showed a retrocardiac infiltrate.   He was also found to have a nonocclusive thrombus in the right gastrocnemius vein.  He then underwent CTA of the chest which was negative for PE but showed moderate severity bilateral lobe airspace opacity.  Sputum culture outgrowth, strep pneumonia and Legionella were negative. He was treated with IV Rocephin and  Zithromax.  He was also diuresed with good response.  Concerned that that changes on CT scan were due to to COVID and not from acute bacterial process so he was not sent home with any antibiotics.  He was started on Lovenox for DVT during hospital stay and transition to Eliquis with anticipation to be on it for 6 weeks.  He was then discharged on 09/27/2021.  Patient return to the hospital on 10/03/2021 with increased oxygen requirements and shortness of breath.  He was treated for possible AECOPD with empiric antibiotics and IV steroids which were eventually transition to oral steroids and tapered off prior to discharge.  He did have a HRCT during this stay which showed a dilated main pulmonary artery, measuring up to 4.3 cm, and prominent subcentimeter mediastinal lymph nodes which were decreased in size in comparison to prior CT and suspected to be reactive.  He was noted to have bilateral solid pulmonary nodules and peripheral masslike opacities which were more pronounced in the lower lungs. Several of the nodules/masses were increased in size when compared to prior exam.  He was not discharged on any antibiotics or steroids.  Was able to be weaned back to his baseline of 2 L/min supplemental O2 and continued on his previous medications with recommendation to follow-up with pulmonary outpatient. Discharged 5/16.  TEST/EVENTS:  10/04/2021 HRCT chest: dilated main pulmonary artery, measuring up to 4.3 cm. Prominent subcm mediastinal lymph nodes, decreased when compared to previous  CT and likely reactive. The central airways are patent. There are bilateral solid pulmonary nodules and peripheral masslike opacities which are most pronounced in the lower lungs. Several of the nodules/masses have increased size when compared with prior exam. Reference masslike opacity located in the RLL and measures 3.5x2.4 cm, previously 2x2.2 cm.  10/16/2021 CXR 2 view: lungs are clear; appearance of the chest is the same as on  08/17/2020 plain films.  12/12/2021 Super D CT chest: Gross enlargement of the main pulmonary artery measuring up to 4.3 cm in caliber.  Confluent nodular and masslike opacities at the medial, subpleural bilateral lower lobes are improved compared to prior exam, although persistent.  The largest nodular component in the deep medial right costophrenic recess measures 2.9 cm, previously 3.9 x 2.4 cm.  The elongated confluent component in the medial right lower lobe measures 5.5 x 1.1 cm, previously 6.2 x 1.7 cm.  Hepatic steatosis.  10/23/2021: Mark Harrell with Marlin Brys NP for hospital follow up. He reports that he was feeling better but over the last week feels like his cough has become more productive with yellow to green sputum. His breathing is overall stable and he does not have any increased SOB from baseline. He denies having any fevers, night sweats, anorexia, weight loss, wheezing, or hemoptysis. He continues on Trelegy daily; uses his neb 1-2 times a day. He was previously doing budesonide nebs but hasn't been doing these since he was discharged. Recommended on mucociliary clearance therapies. Discussed that he may need bronchoscopy for further evaluation. Ordered CBC with diff, sputum culture, AFB and fungal culture. CBC without leukocytosis. He was unable to collect cultures.   11/14/2021: OV with Dr. [name].  Overall improved since admission.  Denies any cough, sputum production, fevers or chills.  Latest high-res CT scan shows nodular consolidation which looks like organizing pneumonia.  There could also be underlying malignancy but the CT was done in the context of his recurrent admissions with pneumonia.  Overall given improvement will reassess with super D CT and determine if we need to pursue bronchoscopy/biopsy.  Continued on Trelegy inhaler and Dupixent.  Noncompliant CPAP due to sinus congestion-encouraged resumption of therapy.  12/16/2021: Today-follow-up Patient presents today for follow-up after  undergoing CT scan of the chest, which showed improved but persistent nodular/masslike opacities in bilateral lower lobes.  He reports that he has been feeling like he is declining since he was last seen.  Feels like he gets more winded with long distances and overall just feels more fatigued.  He has been coughing a little bit more; however has not been able to to produce much sputum.  When he does it is usually white.  Denies any wheezing, fevers, night sweats, weight loss, anorexia, hemoptysis, PND.  He is still not wearing CPAP therapy at night and is unable to lay entirely flat when sleeping, which has been normal for him for years.  He usually sleeps in a recliner.  He is curious if he can buy some portable oxygen tanks instead of renting them.  He also has trouble carrying the POC machine due to shoulder pain so he would like so that he could wheel around.  He is currently with [employer] for his DME.  Allergies  Allergen Reactions   Bee Venom Swelling   Cymbalta [Duloxetine Hcl] Shortness Of Breath    Brought on asthma   Ppd [Tuberculin Purified Protein Derivative] Other (See Comments)    +ppd NEG Quantferron Gold 3/13 (shows false positive)  Calan [Verapamil] Other (See Comments)    Back pain   Tricor [Fenofibrate] Other (See Comments)    Back pain   Claritin [Loratadine] Other (See Comments)    Unknown reaction    Levaquin [Levofloxacin] Diarrhea    Immunization History  Administered Date(s) Administered   Influenza,inj,Quad PF,6+ Mos 03/04/2016, 03/09/2017, 05/19/2018, 01/06/2019   Influenza-Unspecified 02/24/2015, 05/19/2018, 12/28/2018, 01/24/2019, 04/10/2021   PPD Test 08/06/2011   Pneumococcal Conjugate-13 11/02/2014   Pneumococcal Polysaccharide-23 04/10/2016   Pneumococcal-Unspecified 05/26/2004   Td 05/26/2000   Tdap 08/01/2013   Zoster Recombinat (Shingrix) 11/05/2016, 03/14/2017   Zoster, Live 05/26/2009    Past Medical History:  Diagnosis Date   Anesthesia  complication requiring reversal agent administration    ? from central apnea, very difficult to get off vent   Anxiety    Arthritis    osteo   Asthma    BPH (benign prostatic hyperplasia)    Complication of anesthesia    difficulty waking , they twlight me because of my respiratory problems "   Depression    Dyspnea    on exertion   Enlarged heart    Family history of adverse reaction to anesthesia    mother trouble waking up, and heart stopped   GERD (gastroesophageal reflux disease)    Headache    botox injections for headaches   Hyperlipidemia    Hypertension    Hypogonadism male    IBS (irritable bowel syndrome)    Memory difficulties    short term memories   Neuropathy    Obesity    On home oxygen therapy    on 2 liter   OSA (obstructive sleep apnea)    not using cpap   Paralysis (HCC)    left hand - small   Pneumonia    Pre-diabetes    Prostatitis     Tobacco History: Social History   Tobacco Use  Smoking Status Former   Packs/day: 0.10   Years: 15.00   Total pack years: 1.50   Types: Cigarettes   Quit date: 05/27/1983   Years since quitting: 38.5   Passive exposure: Never  Smokeless Tobacco Never  Tobacco Comments   significant second-hand exposure through mother   Counseling given: Not Answered Tobacco comments: significant second-hand exposure through mother   Outpatient Medications Prior to Visit  Medication Sig Dispense Refill   acetaminophen (TYLENOL) 500 MG tablet Take 1,000 mg by mouth 3 (three) times daily.     anastrozole (ARIMIDEX) 1 MG tablet Take 1 mg by mouth daily.     apixaban (ELIQUIS) 5 MG TABS tablet Take 1 tablet (5 mg total) by mouth 2 (two) times daily. 60 tablet 1   ascorbic acid (VITAMIN C) 1000 MG tablet Take 1,000 mg by mouth every evening.     aspirin EC 81 MG tablet Take 81 mg by mouth daily. Swallow whole.     Azelastine HCl 137 MCG/SPRAY SOLN USE 1 TO 2 SPRAYS EACH NOSTRIL 2 X /DAY 30 mL 0   benzonatate (TESSALON) 200  MG capsule TAKE 1 CAPSULE BY MOUTH 3 TIMES A DAY TO PREVENT COUGH 90 capsule 1   Botulinum Toxin Type A 200 units SOLR Inject 200 Units into the skin every 3 (three) months.     budesonide (PULMICORT) 0.5 MG/2ML nebulizer solution Take 2 mLs (0.5 mg total) by nebulization 3 (three) times daily as needed. (Patient taking differently: Take 0.5 mg by nebulization 2 (two) times daily.) 120 mL 5   Calcium Carb-Cholecalciferol (  CALCIUM + VITAMIN D3 PO) Take 1 tablet by mouth daily.     cetaphil (CETAPHIL) lotion Apply topically 2 (two) times daily. (Patient taking differently: Apply 1 application  topically 2 (two) times daily.) 236 mL 0   cetirizine (ZYRTEC) 10 MG tablet Take 1 tablet (10 mg total) by mouth 2 (two) times daily as needed for allergies (Can take an extra dose during flares). (Patient taking differently: Take 10 mg by mouth in the morning.) 60 tablet 5   Cholecalciferol (VITAMIN D3) 125 MCG (5000 UT) TABS Take 10,000 Units by mouth 2 (two) times daily. Morning and evening     Cinnamon 500 MG capsule Take 1,000 mg by mouth 3 (three) times daily.     Coenzyme Q10 (CO Q 10 PO) Take 600 mg by mouth daily.     diclofenac Sodium (VOLTAREN) 1 % GEL APPLY 2 TO 4 GRAMS TOPICALLY 2 TO 4 TIMES DAILY FOR PAIN & INFLAMMATION( OTC NOT COVERED) (Patient taking differently: 2-4 g 2 (two) times daily as needed (pain).) 300 g 3   DM-APAP-CPM (CORICIDIN HBP MAX STRENGTH FLU PO) Take 1 tablet by mouth 3 (three) times daily.     donepezil (ARICEPT) 23 MG TABS tablet Take 23 mg by mouth daily.     doxycycline (ADOXA) 100 MG tablet Take 100 mg by mouth 2 (two) times daily. prophylactic     dupilumab (DUPIXENT) 300 MG/2ML prefilled syringe Inject 300 mg into the skin every 14 (fourteen) days. 4 mL 11   EPINEPHrine 0.3 mg/0.3 mL IJ SOAJ injection Inject 0.3 mg into the muscle as needed. (Patient taking differently: Inject 0.3 mg into the muscle as needed for anaphylaxis.) 2 each 2   fluticasone (FLONASE) 50 MCG/ACT  nasal spray PLACE 2 SPRAYS INTO BOTH NOSTRILS DAILY. USE 1 TO 2 SPRAYS EACH NOSTRIL 2 X /DAY 16 mL 0   Fluticasone-Umeclidin-Vilant (TRELEGY ELLIPTA) 100-62.5-25 MCG/ACT AEPB Inhale 1 puff into the lungs daily. 60 each 5   furosemide (LASIX) 80 MG tablet Take 80 mg by mouth 2 (two) times daily.     Guaifenesin 1200 MG TB12 Take 1,200 mg by mouth 3 (three) times daily.     ipratropium-albuterol (DUONEB) 0.5-2.5 (3) MG/3ML SOLN TAKE 3 MLS BY NEBULIZATION EVERY 4 (FOUR) HOURS AS NEEDED. 270 mL 1   Krill Oil 500 MG CAPS Take 500 mg by mouth 2 (two) times daily.     Magnesium 500 MG CAPS Take 500 mg by mouth 3 (three) times daily.     memantine (NAMENDA) 10 MG tablet Take 10 mg by mouth 2 (two) times daily.     metFORMIN (GLUCOPHAGE-XR) 500 MG 24 hr tablet Takes 2 tablets  2 x /day  with Meals  for Diabetes (Patient taking differently: Take 1,000 mg by mouth 2 (two) times daily.) 360 tablet 3   metoprolol tartrate (LOPRESSOR) 25 MG tablet Take 1 tablet (25 mg total) by mouth 2 (two) times daily. 60 tablet 2   Misc Natural Products (GLUCOS-CHONDROIT-MSM COMPLEX) TABS Take 2 tablets by mouth 3 (three) times daily.     montelukast (SINGULAIR) 10 MG tablet TAKE 1 TABLET BY MOUTH DAILY FOR ALLERGIES 90 tablet 1   montelukast (SINGULAIR) 10 MG tablet TAKE 1 TABLET BY MOUTH DAILY FOR ALLERGIES 90 tablet 0   Multiple Vitamins-Minerals (MULTIVITAMIN WITH MINERALS) tablet Take 1 tablet by mouth daily.     Multiple Vitamins-Minerals (PRESERVISION AREDS 2) CAPS Take 1 capsule by mouth 2 (two) times daily.     oxymetazoline (  AFRIN) 0.05 % nasal spray Place 1 spray into both nostrils at bedtime.     pravastatin (PRAVACHOL) 40 MG tablet TAKE 1 TABLET BY MOUTH AT BEDTIME FOR CHOLESTEROL (Patient taking differently: Take 40 mg by mouth daily.) 90 tablet 3   pregabalin (LYRICA) 150 MG capsule Take  1 capsule  3 x /day for Chronic Pain                                          /                          TAKE                               BY               MOUTH (Patient taking differently: Take 150 mg by mouth 3 (three) times daily.) 270 capsule 1   PROBIOTIC PRODUCT PO Take 1 capsule by mouth 2 (two) times daily. VSL     promethazine-dextromethorphan (PROMETHAZINE-DM) 6.25-15 MG/5ML syrup Take  1 teaspoon (5 ml)  every 4 hours  as needed  for Cough                                   /                              TAKE            BY                MOUTH (Patient taking differently: Take 5 mLs by mouth 4 (four) times daily as needed for cough.) 240 mL 3   sertraline (ZOLOFT) 100 MG tablet Take 100 mg by mouth 2 (two) times daily.     sodium chloride (OCEAN) 0.65 % SOLN nasal spray Place 1 spray into both nostrils 4 (four) times daily as needed for congestion. Uses each time before other nasal sprays     tadalafil (CIALIS) 5 MG tablet Take 5 mg by mouth daily.      trospium (SANCTURA) 20 MG tablet Take 20 mg by mouth 2 (two) times daily. In the evening and at bedtime     TURMERIC PO Take 2 tablets by mouth 3 (three) times daily.     VENTOLIN HFA 108 (90 Base) MCG/ACT inhaler INHALE 2 PUFFS BY MOUTH EVERY 4 HOURS AS NEEDED FOR WHEEZE OR FOR SHORTNESS OF BREATH (Patient taking differently: Inhale 2 puffs into the lungs every 4 (four) hours as needed for wheezing or shortness of breath.) 18 each 1   zinc gluconate 50 MG tablet Take 50 mg by mouth 3 (three) times daily.     cyclobenzaprine (FLEXERIL) 10 MG tablet TAKE 1/2 TO 1 TABLET 3 X DAY ONLY IF NEED FOR MUSCLE SPASM (NECK) 90 tablet 0   potassium chloride SA (KLOR-CON M) 20 MEQ tablet Take 2 tablets (40 mEq total) by mouth 2 (two) times daily. 120 tablet 0   Semaglutide,0.25 or 0.'5MG'$ /DOS, 2 MG/3ML SOPN Inject 0.25 mg into the skin once a week. (Patient not taking: Reported on 12/11/2021) 4 mL 1   No facility-administered medications prior to visit.  Review of Systems:   Constitutional: No weight loss or gain, night sweats, fevers, chills, lassitude. +fatigue HEENT:  No headaches, difficulty swallowing, tooth/dental problems, or sore throat. No sneezing, itching, ear ache, nasal congestion, or post nasal drip CV:  +orthopnea (baseline; noncompliant with CPAP). No chest pain, PND, swelling in lower extremities, anasarca, dizziness, palpitations, syncope Resp: +shortness of breath with exertion; increased cough, minimally productive; chest congestion. No hemoptysis. No wheezing.  No chest wall deformity GI:  No heartburn, indigestion, abdominal pain, nausea, vomiting, diarrhea, change in bowel habits, loss of appetite, bloody stools.  Skin: No rash, lesions, ulcerations MSK:  No joint pain or swelling.  No decreased range of motion.  No back pain. Neuro: No dizziness or lightheadedness.  Psych: No depression or anxiety. Mood stable.     Physical Exam:  BP (!) 144/88 (BP Location: Right Arm, Patient Position: Sitting, Cuff Size: Large)   Pulse 100   Temp 99.1 F (37.3 C) (Oral)   Ht 6' (1.829 m)   Wt (!) 371 lb 9.6 oz (168.6 kg)   SpO2 93%   BMI 50.40 kg/m   GEN: Pleasant, interactive, chronically-ill appearing; morbidly obese; in no acute distress. HEENT:  Normocephalic and atraumatic. PERRLA. Sclera white. Nasal turbinates pink, moist and patent bilaterally. No rhinorrhea present. Oropharynx pink and moist, without exudate or edema. No lesions, ulcerations, or postnasal drip.  NECK:  Supple w/ fair ROM. No JVD present. Normal carotid impulses w/o bruits. Thyroid symmetrical with no goiter or nodules palpated. No lymphadenopathy.   CV: RRR, no m/r/g, no peripheral edema. Pulses intact, +2 bilaterally. No cyanosis, pallor or clubbing. PULMONARY:  Unlabored, regular breathing. Clear bilaterally A&P w/o wheezes/rales/rhonchi. No accessory muscle use. No dullness to percussion. GI: BS present and normoactive. Soft, non-tender to palpation. No organomegaly or masses detected. No CVA tenderness. MSK: No erythema, warmth or tenderness. Cap refil <2 sec all  extrem. No deformities or joint swelling noted.  Neuro: A/Ox3. No focal deficits noted.   Skin: Warm, no lesions or rashe Psych: Normal affect and behavior. Judgement and thought content appropriate.     Lab Results:  CBC    Component Value Date/Time   WBC 10.5 10/23/2021 1616   RBC 4.58 10/23/2021 1616   HGB 13.9 10/23/2021 1616   HGB 15.6 03/06/2020 1607   HGB 14.1 12/25/2010 1442   HCT 41.7 10/23/2021 1616   HCT 47.8 03/06/2020 1607   HCT 41.9 12/25/2010 1442   PLT 200.0 10/23/2021 1616   PLT 261 03/06/2020 1607   MCV 91.0 10/23/2021 1616   MCV 88 03/06/2020 1607   MCV 89.8 12/25/2010 1442   MCH 30.7 10/16/2021 1453   MCHC 33.3 10/23/2021 1616   RDW 15.8 (H) 10/23/2021 1616   RDW 13.4 03/06/2020 1607   RDW 13.3 12/25/2010 1442   LYMPHSABS 2.9 10/23/2021 1616   LYMPHSABS 3.0 03/06/2020 1607   LYMPHSABS 3.3 12/25/2010 1442   MONOABS 1.2 (H) 10/23/2021 1616   MONOABS 1.0 (H) 12/25/2010 1442   EOSABS 0.3 10/23/2021 1616   EOSABS 0.3 03/06/2020 1607   BASOSABS 0.1 10/23/2021 1616   BASOSABS 0.1 03/06/2020 1607   BASOSABS 0.1 12/25/2010 1442    BMET    Component Value Date/Time   NA 144 10/16/2021 1453   K 4.5 10/16/2021 1453   CL 105 10/16/2021 1453   CO2 28 10/16/2021 1453   GLUCOSE 92 10/16/2021 1453   BUN 18 10/16/2021 1453   CREATININE 0.85 10/16/2021 1453   CALCIUM 9.8 10/16/2021  [place] 10/08/2021 0216   GFRNONAA 99 08/29/2020 1649   GFRAA 115 08/29/2020 1649    BNP    Component Value Date/Time   BNP 33.2 10/03/2021 2112   BNP 173 (H) 01/08/2017 1611     Imaging:  CT Super D Chest Wo Contrast  Result Date: 12/13/2021 CLINICAL DATA:  Follow-up lung nodules, former smoker EXAM: CT CHEST WITHOUT CONTRAST TECHNIQUE: Multidetector CT imaging of the chest was performed using thin slice collimation for electromagnetic bronchoscopy planning purposes, without intravenous contrast. RADIATION DOSE REDUCTION: This exam was performed according  to the departmental dose-optimization program which includes automated exposure control, adjustment of the mA and/or kV according to patient size and/or use of iterative reconstruction technique. COMPARISON:  10/04/2021 FINDINGS: Cardiovascular: No significant vascular findings. Normal heart size. Gross enlargement of the main pulmonary artery measuring up to 4.3 cm in caliber. No pericardial effusion. Mediastinum/Nodes: No enlarged mediastinal, hilar, or axillary lymph nodes. Thyroid gland, trachea, and esophagus demonstrate no significant findings. Lungs/Pleura: Confluent nodular and masslike opacities of the medial, subpleural bilateral lower lobes are improved compared to prior examination, although persistent. Largest nodular component in the deep medial right costophrenic recess measures 2.9 cm, previously 3.9 x 2.4 cm (series 5, image 141). Elongated, confluent component of the medial right lower lobe measures 5.5 x 1.1 cm, previously 6.2 x 1.7 cm (series 5, image 107). No pleural effusion or pneumothorax. Upper Abdomen: No acute abnormality.  Hepatic steatosis. Musculoskeletal: No chest wall abnormality. No suspicious osseous lesions identified. Disc degenerative disease and bridging osteophytosis throughout the thoracic spine, consistent with DISH. IMPRESSION: 1. Confluent nodular and masslike opacities of the medial, subpleural bilateral lower lobes are improved compared to prior examination, although persistent. Findings are most consistent with improved atypical infection or aspiration. 2. Gross enlargement of the main pulmonary artery, as can be seen in pulmonary hypertension. 3. Hepatic steatosis. Electronically Signed   By: Delanna Ahmadi M.D.   On: 12/13/2021 16:55         Latest Ref Rng & Units 12/28/2017    2:28 PM 04/21/2016    8:06 AM  PFT Results  FVC-Pre L 3.58  3.47   FVC-Predicted Pre % 68  66   FVC-Post L 3.41  3.84   FVC-Predicted Post % 65  73   Pre FEV1/FVC % % 75  74   Post  FEV1/FCV % % 79  72   FEV1-Pre L 2.70  2.58   FEV1-Predicted Pre % 68  64   FEV1-Post L 2.69  2.78   DLCO uncorrected ml/min/mmHg 33.07  33.45   DLCO UNC% % 94  95   DLVA Predicted % 125  117   TLC L 5.84  6.21   TLC % Predicted % 79  84   RV % Predicted % 100  96     Lab Results  Component Value Date   NITRICOXIDE 10 12/28/2017        Assessment & Plan:   Recurrent pneumonia Recent CT with improving nodular and masslike opacities. He feels like he has declined since our last visit, regarding his breathing and energy. Cough is minimally productive and no other infectious symptoms. Discussed case with Dr. [name]; given improvement of imaging, recommended holding off on bronchoscopy. We will continue to monitor his symptoms and repeat CT in 6-12 weeks. Ongoing discussion with immunology on starting immunotherapy.    Patient Instructions  Continue Trelegy 1 puff daily. Brush tongue and rinse mouth afterwards  Continue Albuterol inhaler 2 puffs or duoneb 3 mL neb every 6 hours as needed for shortness of breath or wheezing. Notify if symptoms persist despite rescue inhaler/neb use. Continue budesonide 2 mL neb Twice daily as needed for shortness of breath or wheezing  Continue Zyrtec 10 mg daily for allergies  Continue flonase nasal spray 2 sprays each nostril daily Continue singulair 10 mg At bedtime  Continue supplemental oxygen 3 lpm POC with activity and 2 lpm continuous at night for goal >88-90%  Continue Mucinex 600 mg Twice daily  Continue Flutter valve 2-3 times a day   Prednisone 40 mg daily for 5 days. Take in AM with food.   Follow up in 4-6 weeks with Dr. [name]. We will adjust this if we schedule you for bronchoscopy. If symptoms do not improve or worsen, please contact office for sooner follow up or seek emergency care.    Asthma-COPD overlap syndrome ([place]) Possible mild exacerbation with increased cough and DOE. We will treat him with prednisone burst. Given  improvement in imaging, we will hold off on any further abx at this time. Continue triple therapy regimen with Trelegy and PRN albuterol. Continue mucociliary clearance therapies with mucinex and flutter valve. Continue trigger prevention with singulair.   Chronic nonseasonal allergic rhinitis due to pollen Well-controlled on current regimen. Continue Zyrtec, singulair, azelastine, flonase. Follow up with Dr. [name] as scheduled.   Chronic respiratory failure with hypoxia (HCC) Stable without any increased O2 demand. He is having trouble carrying his PCP due to chronic shoulder pain. He would like to buy some portable tanks that he can roll behind him. Advised him to contact his current DME to see what options he has for out of pocket purchasing. If he needs any further assistance, he will let us know. Continue 3 lpm supplemental O2 with activity and 2 lpm at night. Goal O2 >88-90%.   OSA (obstructive sleep apnea) Intolerant of CPAP. He stopped using due to nasal symptoms. Likely contributing to fatigue. Encouraged to restart but he declined. We discussed how untreated sleep apnea puts an individual at risk for cardiac arrhthymias, pulm HTN, DM, stroke and increases their risk for daytime accidents. He sleeps upright in a recliner. Cautioned against drowsy driving. We will continue to have ongoing conversations regarding OSA.    I spent 35 minutes of dedicated to the care of this patient on the date of this encounter to include pre-visit review of records, face-to-face time with the patient discussing conditions above, post visit ordering of testing, clinical documentation with the electronic health record, making appropriate referrals as documented, and communicating necessary findings to members of the patients care team.  Mark Bibles, NP 12/17/2021  Pt aware and understands NP's role.

## 2021-12-16 NOTE — Patient Instructions (Addendum)
Continue Trelegy 1 puff daily. Brush tongue and rinse mouth afterwards  Continue Albuterol inhaler 2 puffs or duoneb 3 mL neb every 6 hours as needed for shortness of breath or wheezing. Notify if symptoms persist despite rescue inhaler/neb use. Continue budesonide 2 mL neb Twice daily as needed for shortness of breath or wheezing  Continue Zyrtec 10 mg daily for allergies  Continue flonase nasal spray 2 sprays each nostril daily Continue singulair 10 mg At bedtime  Continue supplemental oxygen 3 lpm POC with activity and 2 lpm continuous at night for goal >88-90%  Continue Mucinex 600 mg Twice daily  Continue Flutter valve 2-3 times a day   Prednisone 40 mg daily for 5 days. Take in AM with food.   Follow up in 4-6 weeks with Dr. [name]. We will adjust this if we schedule you for bronchoscopy. If symptoms do not improve or worsen, please contact office for sooner follow up or seek emergency care.

## 2021-12-17 ENCOUNTER — Telehealth: Payer: Self-pay | Admitting: Nurse Practitioner

## 2021-12-17 ENCOUNTER — Encounter: Payer: Self-pay | Admitting: Nurse Practitioner

## 2021-12-17 MED ORDER — CYCLOBENZAPRINE HCL 10 MG PO TABS: ORAL_TABLET | ORAL | 0 refills | Status: DC

## 2021-12-17 MED ORDER — PREDNISONE 20 MG PO TABS: 40.0000 mg | ORAL_TABLET | Freq: Every day | ORAL | 0 refills | Status: AC

## 2021-12-17 NOTE — Telephone Encounter (Signed)
Left VM for patient - detailed message ok per pt. Notified that Dr. [name] would like to hold off on bronchoscopy given improvement in his imaging. I am going to treat him for possible asthma/COPD exacerbation with his increased DOE and cough recently. Prednisone burst sent - 40 mg daily for 5 days. Take in AM with food. Advised he call back with any questions/concerns or if he were to develop worsening symptoms.

## 2021-12-17 NOTE — Assessment & Plan Note (Addendum)
Recent CT with improving nodular and masslike opacities. He feels like he has declined since our last visit, regarding his breathing and energy. Cough is minimally productive and no other infectious symptoms. Discussed case with Dr. [name]; given improvement of imaging, recommended holding off on bronchoscopy. We will continue to monitor his symptoms and repeat CT in 6-12 weeks. Ongoing discussion with immunology on starting immunotherapy.    Patient Instructions  Continue Trelegy 1 puff daily. Brush tongue and rinse mouth afterwards  Continue Albuterol inhaler 2 puffs or duoneb 3 mL neb every 6 hours as needed for shortness of breath or wheezing. Notify if symptoms persist despite rescue inhaler/neb use. Continue budesonide 2 mL neb Twice daily as needed for shortness of breath or wheezing  Continue Zyrtec 10 mg daily for allergies  Continue flonase nasal spray 2 sprays each nostril daily Continue singulair 10 mg At bedtime  Continue supplemental oxygen 3 lpm POC with activity and 2 lpm continuous at night for goal >88-90%  Continue Mucinex 600 mg Twice daily  Continue Flutter valve 2-3 times a day   Prednisone 40 mg daily for 5 days. Take in AM with food.   Follow up in 4-6 weeks with Dr. [name]. We will adjust this if we schedule you for bronchoscopy. If symptoms do not improve or worsen, please contact office for sooner follow up or seek emergency care.

## 2021-12-17 NOTE — Assessment & Plan Note (Signed)
Possible mild exacerbation with increased cough and DOE. We will treat him with prednisone burst. Given improvement in imaging, we will hold off on any further abx at this time. Continue triple therapy regimen with Trelegy and PRN albuterol. Continue mucociliary clearance therapies with mucinex and flutter valve. Continue trigger prevention with singulair.

## 2021-12-17 NOTE — Assessment & Plan Note (Signed)
Well-controlled on current regimen. Continue Zyrtec, singulair, azelastine, flonase. Follow up with Dr. [name] as scheduled.

## 2021-12-17 NOTE — Assessment & Plan Note (Signed)
Intolerant of CPAP. He stopped using due to nasal symptoms. Likely contributing to fatigue. Encouraged to restart but he declined. We discussed how untreated sleep apnea puts an individual at risk for cardiac arrhthymias, pulm HTN, DM, stroke and increases their risk for daytime accidents. He sleeps upright in a recliner. Cautioned against drowsy driving. We will continue to have ongoing conversations regarding OSA.

## 2021-12-17 NOTE — Assessment & Plan Note (Signed)
Stable without any increased O2 demand. He is having trouble carrying his PCP due to chronic shoulder pain. He would like to buy some portable tanks that he can roll behind him. Advised him to contact his current DME to see what options he has for out of pocket purchasing. If he needs any further assistance, he will let us know. Continue 3 lpm supplemental O2 with activity and 2 lpm at night. Goal O2 >88-90%.

## 2021-12-19 DIAGNOSIS — G43719 Chronic migraine without aura, intractable, without status migrainosus: Secondary | ICD-10-CM | POA: Diagnosis not present

## 2021-12-25 ENCOUNTER — Ambulatory Visit: Payer: Self-pay

## 2021-12-25 ENCOUNTER — Ambulatory Visit (INDEPENDENT_AMBULATORY_CARE_PROVIDER_SITE_OTHER): Payer: BC Managed Care – PPO | Admitting: Orthopaedic Surgery

## 2021-12-25 ENCOUNTER — Ambulatory Visit (INDEPENDENT_AMBULATORY_CARE_PROVIDER_SITE_OTHER): Payer: BC Managed Care – PPO

## 2021-12-25 VITALS — Wt 371.6 lb

## 2021-12-25 DIAGNOSIS — M1711 Unilateral primary osteoarthritis, right knee: Secondary | ICD-10-CM | POA: Diagnosis not present

## 2021-12-25 DIAGNOSIS — M25561 Pain in right knee: Secondary | ICD-10-CM

## 2021-12-25 DIAGNOSIS — M25562 Pain in left knee: Secondary | ICD-10-CM | POA: Diagnosis not present

## 2021-12-25 DIAGNOSIS — M1712 Unilateral primary osteoarthritis, left knee: Secondary | ICD-10-CM | POA: Diagnosis not present

## 2021-12-25 DIAGNOSIS — G8929 Other chronic pain: Secondary | ICD-10-CM | POA: Diagnosis not present

## 2021-12-25 DIAGNOSIS — M170 Bilateral primary osteoarthritis of knee: Secondary | ICD-10-CM

## 2021-12-25 MED ORDER — LIDOCAINE HCL 1 % IJ SOLN
3.0000 mL | INTRAMUSCULAR | Status: AC | PRN
  Administered 2021-12-25: 3 mL

## 2021-12-25 MED ORDER — METHYLPREDNISOLONE ACETATE 40 MG/ML IJ SUSP
40.0000 mg | INTRAMUSCULAR | Status: AC | PRN
  Administered 2021-12-25: 40 mg via INTRA_ARTICULAR

## 2021-12-25 NOTE — Progress Notes (Signed)
Office Visit Note   Patient: Mark Harrell.           Date of Birth: 1960-11-02           MRN: [id number] Visit Date: 12/25/2021              Requested by: [name], [address],  [place] [address] PCP: [name], MD   Assessment & Plan: Visit Diagnoses:  1. Chronic pain of left knee   2. Chronic pain of right knee   3. Unilateral primary osteoarthritis, left knee   4. Unilateral primary osteoarthritis, right knee     Plan: Unfortunately given his weight he is not a candidate for knee replacement surgery.  I did recommend a steroid injection of both knees today and he actually requested this as well.  He is a diabetic but has a hemoglobin A1c in the 5 range.  He is also a good candidate for hyaluronic acid to treat the long-term pain from his knee arthritis.  We will see if we can order this for both knees.  All questions and concerns were answered and addressed.  Follow-Up Instructions: No follow-ups on file.   Orders:  Orders Placed This Encounter  Procedures   Large Joint Inj   Large Joint Inj   XR Knee 1-2 Views Right   XR Knee 1-2 Views Left   No orders of the defined types were placed in this encounter.     Procedures: Large Joint Inj: R knee on 12/25/2021 3:52 PM Indications: diagnostic evaluation and pain Details: 22 G 1.5 in needle, superolateral approach  Arthrogram: No  Medications: 3 mL lidocaine 1 %; 40 mg methylPREDNISolone acetate 40 MG/ML Outcome: tolerated well, no immediate complications Procedure, treatment alternatives, risks and benefits explained, specific risks discussed. Consent was given by the patient. Immediately prior to procedure a time out was called to verify the correct patient, procedure, equipment, support staff and site/side marked as required. Patient was prepped and draped in the usual sterile fashion.    Large Joint Inj: L knee on 12/25/2021 3:52 PM Indications: diagnostic evaluation and  pain Details: 22 G 1.5 in needle, superolateral approach  Arthrogram: No  Medications: 3 mL lidocaine 1 %; 40 mg methylPREDNISolone acetate 40 MG/ML Outcome: tolerated well, no immediate complications Procedure, treatment alternatives, risks and benefits explained, specific risks discussed. Consent was given by the patient. Immediately prior to procedure a time out was called to verify the correct patient, procedure, equipment, support staff and site/side marked as required. Patient was prepped and draped in the usual sterile fashion.       Clinical Data: No additional findings.   Subjective: Chief Complaint  Patient presents with   Left Knee - Pain   Right Knee - Pain  The patient is someone we have seen in the past.  He has chronic bilateral knee pain.  He does ambulate with a cane.  His pains been worsening with the right worse than the left.  He does have remote history of arthroscopic surgery on that left knee.  He has dealing with pulmonary issues that are chronic.  He also is morbidly obese with a BMI of 50.4.  He does get a lot of popping and catching with his left knee.  HPI  Review of Systems There is currently listed no fever, chills, nausea, vomiting  Objective: Vital Signs: Wt (!) 371 lb 9.6 oz (168.6 kg)   BMI 50.40 kg/m   Physical  Exam He is alert and oriented x3 and in no acute distress Ortho Exam Examination of both knees shows only slight varus malalignment.  There is not a soft tissue envelope that is larger around either knee.  Both knees have a painful arc of motion mainly at the patellofemoral joint and the medial compartment of both knees. Specialty Comments:  No specialty comments available.  Imaging: XR Knee 1-2 Views Right  Result Date: 12/25/2021 2 views of the right knee show no significant malalignment.  There is mild tricompartment arthritic changes.  XR Knee 1-2 Views Left  Result Date: 12/25/2021 2 views of the left knee show  tricompartment arthritis mainly involving the medial compartment of the knee.  This is moderate arthritis.    PMFS History: Patient Active Problem List   Diagnosis Date Noted   Hypokalemia 09/26/2021   Dyspnea 09/25/2021   Acute deep vein thrombosis (DVT) of right lower extremity ([facility]) 09/24/2021   OSA (obstructive sleep apnea) 09/24/2021   Herniation of cervical intervertebral disc with radiculopathy    Status post cervical discectomy 08/20/2020   Tracheomalacia 05/26/2019   Acquired tracheomalacia 05/26/2019   HNP (herniated nucleus pulposus) with myelopathy, cervical 05/23/2019    Class: Chronic   Spinal stenosis of cervical region 05/23/2019    Class: Chronic   Status post cervical spinal fusion 05/23/2019   Chronic respiratory failure with hypoxia ([facility]) 04/12/2018   Bilateral lower extremity edema 12/29/2017   Hymenoptera allergy 11/10/2017   Family history of colonic polyps 08/07/2017   Myofascial pain 08/06/2017   Seasonal and perennial allergic rhinitis 04/23/2017   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/2017   Cervicalgia 04/01/2017   Recurrent pneumonia 03/09/2017   Other spondylosis with radiculopathy, cervical region 02/09/2017   Depression with anxiety 01/04/2017   Memory difficulty 12/05/2016   Morbid obesity ([facility]) 12/05/2016   Migraine without aura and without status migrainosus, not intractable 10/30/2016   Lumbar radiculopathy 05/15/2016   Osteoarthritis of spine with radiculopathy, lumbar region 05/15/2016   Risk for falls 05/15/2016   Recurrent infections 03/29/2016   Chronic nonseasonal allergic rhinitis due to pollen 03/29/2016   Polypharmacy 01/16/2016   Morbid obesity with BMI of 45.0-49.9, adult ([facility]) 03/06/2015   SDAT 02/05/2015   OSA and COPD overlap syndrome ([facility]) 02/05/2015   Medication management 08/02/2014   GERD (gastroesophageal reflux disease) 05/09/2014   Vitamin D deficiency 08/01/2013   Prediabetes 08/01/2013   Positive TB test 07/29/2011    Diverticula of colon 05/07/2011   Hypertension 01/31/2011   Hyperlipidemia, mixed 01/31/2011   BPH (benign prostatic hyperplasia) 01/31/2011   Testosterone Deficiency 01/31/2011   IBS (irritable bowel syndrome) 01/31/2011   Partial complex seizure disorder with intractable epilepsy ([facility]) 01/31/2011   Depression, major, recurrent, in partial remission ([facility]) 01/31/2011   Asthma-COPD overlap syndrome ([place] [facility]) 01/31/2011   Past Medical History:  Diagnosis Date   Anesthesia complication requiring reversal agent administration    ? from central apnea, very difficult to get off vent   Anxiety    Arthritis    osteo   Asthma    BPH (benign prostatic hyperplasia)    Complication of anesthesia    difficulty waking , they twlight me because of my respiratory problems "   Depression    Dyspnea    on exertion   Enlarged heart    Family history of adverse reaction to anesthesia    mother trouble waking up, and heart stopped   GERD (gastroesophageal reflux disease)    Headache    botox  injections for headaches   Hyperlipidemia    Hypertension    Hypogonadism male    IBS (irritable bowel syndrome)    Memory difficulties    short term memories   Neuropathy    Obesity    On home oxygen therapy    on 2 liter   OSA (obstructive sleep apnea)    not using cpap   Paralysis ([facility])    left hand - small   Pneumonia    Pre-diabetes    Prostatitis     Family History  Problem Relation Age of Onset   Diabetes Paternal Uncle    Cancer Father        lymphoma, colon   Diabetes Maternal Grandmother    Heart disease Maternal Grandfather    Diabetes Maternal Grandfather    Diabetes Paternal Grandmother    Diabetes Paternal Grandfather    Dementia Mother    Prostate cancer Maternal Uncle    Lung disease Neg Hx    Rheumatologic disease Neg Hx     Past Surgical History:  Procedure Laterality Date   ABDOMINAL SURGERY     ANKLE FRACTURE SURGERY Right    ANTERIOR CERVICAL DECOMP/DISCECTOMY FUSION  N/A 05/23/2019   Procedure: ANTERIOR CERVICAL DISCECTOMY FUSION CERVICAL FIVE THROUGH CERVICAL SIX AND CERVICAL SIX THROUGH CERVICAL SEVEN;  Surgeon: Jessy Oto, MD;  Location: [facility];  Service: Orthopedics;  Laterality: N/A;   COLONOSCOPY     CYSTOSCOPY     [place]   KNEE ARTHROSCOPY WITH MEDIAL MENISECTOMY Left 01/02/2017   Procedure: LEFT KNEE ARTHROSCOPY WITH PARTIAL MEDIAL MENISCECTOMY;  Surgeon: Mcarthur Rossetti, MD;  Location: [facility];  Service: Orthopedics;  Laterality: Left;   POSTERIOR CERVICAL FUSION/FORAMINOTOMY N/A 08/20/2020   Procedure: LEFT CERVICAL SIX THROUGH SEVEN  AND CERVICAL SEVEN THROUGH THORACIC ONE  FORAMINOTOMIES WITH EXCISION OF [place] HERNIATION LEFT CERVICAL SEVEN THROUGH THORACIC ONE;  Surgeon: Jessy Oto, MD;  Location: [facility];  Service: Orthopedics;  Laterality: N/A;   TONSILLECTOMY     TURBINATE RESECTION  2007   UVULOPALATOPHARYNGOPLASTY     Social History   Occupational History   Occupation: Dance movement psychotherapist  Tobacco Use   Smoking status: Former    Packs/day: 0.10    Years: 15.00    Total pack years: 1.50    Types: Cigarettes    Quit date: 05/27/1983    Years since quitting: 38.6    Passive exposure: Never   Smokeless tobacco: Never   Tobacco comments:    significant second-hand exposure through mother  Vaping Use   Vaping Use: Never used  Substance and Sexual Activity   Alcohol use: Yes    Alcohol/week: 1.0 standard drink of alcohol    Types: 1 Glasses of wine per week    Comment: 2 x a year   Drug use: No   Sexual activity: Never    Birth control/protection: None

## 2021-12-26 ENCOUNTER — Other Ambulatory Visit: Payer: Self-pay

## 2021-12-26 ENCOUNTER — Telehealth: Payer: Self-pay

## 2021-12-26 ENCOUNTER — Other Ambulatory Visit: Payer: Self-pay | Admitting: Allergy & Immunology

## 2021-12-26 NOTE — Telephone Encounter (Signed)
Bilateral gel injections  

## 2021-12-27 NOTE — Telephone Encounter (Signed)
Noted  

## 2021-12-30 ENCOUNTER — Ambulatory Visit: Payer: Self-pay

## 2021-12-30 ENCOUNTER — Ambulatory Visit: Payer: BC Managed Care – PPO | Admitting: Physical Medicine and Rehabilitation

## 2021-12-30 ENCOUNTER — Encounter: Payer: Self-pay | Admitting: Physical Medicine and Rehabilitation

## 2021-12-30 VITALS — BP 142/85 | HR 70

## 2021-12-30 DIAGNOSIS — M47816 Spondylosis without myelopathy or radiculopathy, lumbar region: Secondary | ICD-10-CM | POA: Diagnosis not present

## 2021-12-30 MED ORDER — BUPIVACAINE HCL 0.5 % IJ SOLN: 3.0000 mL | Freq: Once | INTRAMUSCULAR | Status: DC

## 2021-12-30 NOTE — Progress Notes (Signed)
Pt state lower back pain that travels down his right leg. Pt state walking, standing and laying down makes the pain worse. Pt state he takes pain meds to help ease his pain  Numeric Pain Rating Scale and Functional Assessment Average Pain 5   In the last MONTH (on 0-10 scale) has pain interfered with the following?  1. General activity like being  able to carry out your everyday physical activities such as walking, climbing stairs, carrying groceries, or moving a chair?  Rating(8)   +Driver, -BT, -Dye Allergies.

## 2021-12-30 NOTE — Patient Instructions (Signed)

## 2021-12-31 ENCOUNTER — Encounter: Payer: Self-pay | Admitting: Physical Medicine and Rehabilitation

## 2021-12-31 ENCOUNTER — Ambulatory Visit (INDEPENDENT_AMBULATORY_CARE_PROVIDER_SITE_OTHER): Payer: BC Managed Care – PPO | Admitting: Podiatry

## 2021-12-31 ENCOUNTER — Encounter: Payer: Self-pay | Admitting: Podiatry

## 2021-12-31 DIAGNOSIS — D2372 Other benign neoplasm of skin of left lower limb, including hip: Secondary | ICD-10-CM | POA: Diagnosis not present

## 2021-12-31 DIAGNOSIS — B351 Tinea unguium: Secondary | ICD-10-CM

## 2021-12-31 DIAGNOSIS — M722 Plantar fascial fibromatosis: Secondary | ICD-10-CM | POA: Diagnosis not present

## 2021-12-31 DIAGNOSIS — D2371 Other benign neoplasm of skin of right lower limb, including hip: Secondary | ICD-10-CM

## 2021-12-31 DIAGNOSIS — M79676 Pain in unspecified toe(s): Secondary | ICD-10-CM | POA: Diagnosis not present

## 2021-12-31 MED ORDER — TRIAMCINOLONE ACETONIDE 40 MG/ML IJ SUSP
40.0000 mg | Freq: Once | INTRAMUSCULAR | Status: AC
  Administered 2021-12-31: 40 mg

## 2021-12-31 NOTE — Progress Notes (Signed)
He presents today for follow-up of his painful elongated toenails states that he has not been up and about doing very much she has been sick with COVID and it has really affected his ability to breathe well.  Objective: Vital signs are stable he is alert and oriented x3 pulses are palpable bilateral.  Toenails are long thick yellow dystrophic-like mycotic.  Assessment: Pain in limb secondary to onychomycosis.  Plan: Debridement of toenails 1 through 5 bilateral.

## 2021-12-31 NOTE — Telephone Encounter (Signed)
VOB submitted for SynviscOne, bilateral knee  

## 2022-01-01 ENCOUNTER — Other Ambulatory Visit: Payer: Self-pay | Admitting: Physical Medicine and Rehabilitation

## 2022-01-01 DIAGNOSIS — M47816 Spondylosis without myelopathy or radiculopathy, lumbar region: Secondary | ICD-10-CM

## 2022-01-02 ENCOUNTER — Ambulatory Visit: Payer: BC Managed Care – PPO | Admitting: Allergy & Immunology

## 2022-01-02 ENCOUNTER — Encounter: Payer: Self-pay | Admitting: Allergy & Immunology

## 2022-01-02 ENCOUNTER — Other Ambulatory Visit: Payer: Self-pay

## 2022-01-02 ENCOUNTER — Other Ambulatory Visit: Payer: Self-pay | Admitting: Allergy & Immunology

## 2022-01-02 VITALS — BP 136/84 | HR 96 | Temp 98.3°F | Resp 18

## 2022-01-02 DIAGNOSIS — J302 Other seasonal allergic rhinitis: Secondary | ICD-10-CM

## 2022-01-02 DIAGNOSIS — R918 Other nonspecific abnormal finding of lung field: Secondary | ICD-10-CM | POA: Diagnosis not present

## 2022-01-02 DIAGNOSIS — J449 Chronic obstructive pulmonary disease, unspecified: Secondary | ICD-10-CM

## 2022-01-02 DIAGNOSIS — L2084 Intrinsic (allergic) eczema: Secondary | ICD-10-CM | POA: Diagnosis not present

## 2022-01-02 DIAGNOSIS — B999 Unspecified infectious disease: Secondary | ICD-10-CM

## 2022-01-02 DIAGNOSIS — J3089 Other allergic rhinitis: Secondary | ICD-10-CM

## 2022-01-02 NOTE — Progress Notes (Signed)
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 - with several breakthrough infections as of late with multiple hospitalizations (has been on doxycycline for a few years, but we may need to consider imunoglobulin supplementation   Asthma-COPD overlap syndrome with worsening pulmonary status since contracting COVID pneumonia in January 2023 - changing to nebulized controllers today (NO LONGER ON DUPIXENT due to concern for "immune suppression")  Bilateral pulmonary nodules - still no confirmed etiology of these   Hymenoptera allergy - EpiPen up to date   GERD - on PRN H2 blocker    Obesity - with resulting nerve impingement and joint pain   Chronic back pain - s/p recent neck surgery   Migraines - on botulinum injections   Polypharmacy  Normal echocardiogram (May 2023)  Plan/Recommendations:   1. 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 - Be sure to get the bivalent Omicron booster. - We many need to consider starting immunoglobulin replacement in the future, especially in light of the fact that you are continuing with multiple infections. - I don't think we are quite there yet, but I could be convinced.   2. Asthma-COPD overlap syndrome - Lung testing looked fairly stable today. - I think we need to figure out what is going on in your lungs before we make some major improvement in your breathing status. - Stop the Trelegy.  - Daily controller medication(s): Perforomist + Pulmicort 0.5 mg twice daily via nebulizer - Prior to physical activity: albuterol 2 puffs 10-15 minutes before physical activity. - Rescue medications: albuterol 4 puffs every 4-6 hours as  needed or DuoNeb nebulizer one vial every 4-6 hours as needed - Asthma control goals:  * Full participation in all desired activities (may need albuterol before activity) * Albuterol use two time or less a week on average (not counting use with activity) * Cough interfering with sleep two time or less a month * Oral steroids no more than once a year * No hospitalizations   3. Chronic allergic rhinitis (sweet vernal grass, box elder, cat, weeds, ragweed, molds, cockroach, dust mite) - Continue with aszelastine/fluticasone 2 sprays per nostril up to twice daily. - Continue with saline mist 1-2 times daily.  - Continue with cetirizine '10mg'$  daily as needed for breakthrough symptoms. - Continue with montelukast '10mg'$  in the evening.   4. GERD - Continue famotidine as needed.  5. Return in about 2 months (around 03/04/2022).    Subjective:   Mark Harrell. is a 61 y.o. male presenting today for follow up of  Chief Complaint  Patient presents with   Asthma    Mark Harrell. has a history of the following: Patient Active Problem List   Diagnosis Date Noted   Hypokalemia 09/26/2021   Dyspnea 09/25/2021   Acute deep vein thrombosis (DVT) of right lower extremity ([place]) 09/24/2021   OSA (obstructive sleep apnea) 09/24/2021   Herniation of cervical intervertebral disc with radiculopathy    Status post cervical discectomy 08/20/2020   Tracheomalacia 05/26/2019   Acquired tracheomalacia 05/26/2019   HNP (herniated nucleus pulposus) with myelopathy, cervical 05/23/2019    Class: Chronic   Spinal stenosis of cervical region 05/23/2019  Class: Chronic   Status post cervical spinal fusion 05/23/2019   Chronic respiratory failure with hypoxia ([facility]) 04/12/2018   Bilateral lower extremity edema 12/29/2017   Hymenoptera allergy 11/10/2017   Family history of colonic polyps 08/07/2017   Myofascial pain 08/06/2017   Seasonal and perennial allergic rhinitis 04/23/2017   Munchausen  syndrome 04/14/2017   Cervicalgia 04/01/2017   Recurrent pneumonia 03/09/2017   Other spondylosis with radiculopathy, cervical region 02/09/2017   Depression with anxiety 01/04/2017   Memory difficulty 12/05/2016   Morbid obesity ([place]) 12/05/2016   Migraine without aura and without status migrainosus, not intractable 10/30/2016   Lumbar radiculopathy 05/15/2016   Osteoarthritis of spine with radiculopathy, lumbar region 05/15/2016   Risk for falls 05/15/2016   Recurrent infections 03/29/2016   Chronic nonseasonal allergic rhinitis due to pollen 03/29/2016   Polypharmacy 01/16/2016   Morbid obesity with BMI of 45.0-49.9, adult ([place]) 03/06/2015   SDAT 02/05/2015   OSA and COPD overlap syndrome ([place]) 02/05/2015   Medication management 08/02/2014   GERD (gastroesophageal reflux disease) 05/09/2014   Vitamin D deficiency 08/01/2013   Prediabetes 08/01/2013   Positive TB test 07/29/2011   Diverticula of colon 05/07/2011   Hypertension 01/31/2011   Hyperlipidemia, mixed 01/31/2011   BPH (benign prostatic hyperplasia) 01/31/2011   Testosterone Deficiency 01/31/2011   IBS (irritable bowel syndrome) 01/31/2011   Partial complex seizure disorder with intractable epilepsy ([place]) 01/31/2011   Depression, major, recurrent, in partial remission ([place]) 01/31/2011   Asthma-COPD overlap syndrome ([place]) 01/31/2011    History obtained from: chart review and patient.  Mark Harrell is a 61 y.o. male presenting for a follow up visit.  He was last seen in June 2023.  At that time, he was not doing well since having COVID-19 in early 2023.  He had continued to endorse shortness of breath as well as dyspnea.  He was on 4 L of O2 via nasal cannula as well as a number of antibiotics.  He was on Eliquis for a pulmonary embolism.  We continue with Trelegy 200 mcg 1 puff once daily as well as albuterol as needed.  For his allergic rhinitis, we continue with Dymista 2 sprays per nostril up to twice daily as well as  cetirizine and montelukast.  We continue with famotidine for his GERD and prophylactic doxycycline 100 mg twice daily for his memory B-cell defect.  Since the last visit, he has largely done well.   Asthma/Respiratory Symptom History: He has been having a difficult time with SOB especially at night. He has been sleep in the recliner to try to get some better sleep. He has a CPAP but he wakes up with night terrors and intense reactions including broken furniture.  He remains on the Trelegy. He is using the nebulizer at least once daily, but sometimes more often than that. He is not sure that he is using the Trelegy appropriately.   He had another chest CT that show nodules in both lungs. There has been discussion of COVID fibrosis versus neoplasm versus something else. He was going to have a bronchoscopy performed. He saw the NP and they decided to hold off on this. He was instead given five days of prednisone and this did not seem to help. He is going back in one month for another evaluation. He is frustrated that they have not done any biopsies to determine what is going on.   He does follow with Dr. [name] with Cardiology. He has been cleared for procedures. Echocardiogram in  May 2023 was normal.   Echocardiogram (May 2023): 1. Left ventricular ejection fraction, by estimation, is 70 to 75%. The left ventricle has hyperdynamic function. The left ventricle has no regional wall  motion abnormalities. There is moderate left ventricular hypertrophy. Left ventricular diastolic  parameters are consistent with Grade I diastolic dysfunction (impaired relaxation). 2. Right ventricular systolic function is normal. The right ventricular size is normal. 3. The mitral valve is normal in structure. No evidence of mitral valve regurgitation. No evidence of mitral stenosis. 4. The aortic valve is normal in structure. Aortic valve regurgitation is not visualized. No aortic stenosis is present. 5. The inferior vena  cava is normal in size with greater than 50% respiratory variability, suggesting right atrial pressure of 3 mmHg.   HRCT (July 2023): IMPRESSION: 1. Confluent nodular and masslike opacities of the medial, subpleural bilateral lower lobes are improved compared to prior examination, although persistent. Findings are most consistent with improved atypical infection or aspiration. 2. Gross enlargement of the main pulmonary artery, as can be seen in pulmonary hypertension. 3. Hepatic steatosis.  Allergic Rhinitis Symptom History: He is having a lot of drainage. He also thinks that the tissues close up and expand in his nose.  He remains on the Dymista two sprays per nostril up to twice daily. He is also using the montelukast as well as the cetirizine. He was allergy shots in the past, but never got too far before stopping due to the pandemic I believe. He has never restarted the shots.   He has not required antibiotics at all since the last visit. He remains on the doxycycline that I have him on for his specific antibody deficiency. We have discussed starting immunoglobulin but he is not keen on that.   Otherwise, there have been no changes to his past medical history, surgical history, family history, or social history.    Review of Systems  Constitutional:  Positive for malaise/fatigue. Negative for chills, fever and weight loss.  HENT:  Positive for congestion. Negative for ear discharge, ear pain and sinus pain.   Eyes:  Negative for pain, discharge and redness.  Respiratory:  Positive for cough and shortness of breath. Negative for sputum production and wheezing.   Cardiovascular:  Positive for chest pain. Negative for palpitations and orthopnea.  Gastrointestinal:  Negative for abdominal pain, constipation, diarrhea, heartburn, nausea and vomiting.  Skin:  Positive for itching and rash.  Neurological:  Negative for dizziness and headaches.  Endo/Heme/Allergies:  Negative for environmental  allergies. Does not bruise/bleed easily.       Objective:   Blood pressure 136/84, pulse 96, temperature 98.3 F (36.8 C), temperature source Temporal, resp. rate 18, SpO2 92 %. There is no height or weight on file to calculate BMI.    Physical Exam Vitals reviewed.  Constitutional:      Appearance: He is well-developed. He is morbidly obese. He is ill-appearing. He is not toxic-appearing.     Comments: Less energetic than usual.   HENT:     Head: Normocephalic and atraumatic.     Right Ear: Tympanic membrane, ear canal and external ear normal.     Left Ear: Tympanic membrane, ear canal and external ear normal.     Nose: No nasal deformity, septal deviation, mucosal edema or rhinorrhea.     Right Turbinates: Enlarged, swollen and pale.     Left Turbinates: Enlarged, swollen and pale.     Right Sinus: No maxillary sinus tenderness or frontal sinus tenderness.  Left Sinus: No maxillary sinus tenderness or frontal sinus tenderness.     Mouth/Throat:     Lips: Pink.     Mouth: Mucous membranes are moist. Mucous membranes are not pale and not dry.     Pharynx: Uvula midline.  Eyes:     General: Lids are normal. Allergic shiner present.        Right eye: No discharge.        Left eye: No discharge.     Conjunctiva/sclera: Conjunctivae normal.     Right eye: Right conjunctiva is not injected. No chemosis.    Left eye: Left conjunctiva is not injected. No chemosis.    Pupils: Pupils are equal, round, and reactive to light.  Cardiovascular:     Rate and Rhythm: Normal rate and regular rhythm.     Heart sounds: Normal heart sounds.  Pulmonary:     Effort: Pulmonary effort is normal. No tachypnea, accessory muscle usage or respiratory distress.     Breath sounds: Normal breath sounds. No wheezing, rhonchi or rales.     Comments: Decreased air movement at the bases. Does not appear to be breathing as comfortably as previous exams.  Chest:     Chest wall: No tenderness.   Lymphadenopathy:     Cervical: No cervical adenopathy.  Skin:    Coloration: Skin is not pale.     Findings: No abrasion, erythema, petechiae or rash. Rash is not papular, urticarial or vesicular.  Neurological:     Mental Status: He is alert.  Psychiatric:        Behavior: Behavior is cooperative.      Diagnostic studies:    Spirometry: results abnormal (FEV1: 1.73/46%, FVC: 2.31/47%, FEV1/FVC: 75%).    Spirometry consistent with possible restrictive disease.   Allergy Studies: none       [name], MD  [facility]

## 2022-01-02 NOTE — Patient Instructions (Addendum)
1. 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 - Be sure to get the bivalent Omicron booster. - We many need to consider starting immunoglobulin replacement in the future, especially in light of the fact that you are continuing with multiple infections. - I don't think we are quite there yet, but I could be convinced.   2. Asthma-COPD overlap syndrome - Lung testing looked fairly stable today. - I think we need to figure out what is going on in your lungs before we make some major improvement in your breathing status. - Stop the Trelegy.  - Daily controller medication(s): Perforomist + Pulmicort 0.5 mg twice daily via nebulizer - Prior to physical activity: albuterol 2 puffs 10-15 minutes before physical activity. - Rescue medications: albuterol 4 puffs every 4-6 hours as needed or DuoNeb nebulizer one vial every 4-6 hours as needed - Asthma control goals:  * Full participation in all desired activities (may need albuterol before activity) * Albuterol use two time or less a week on average (not counting use with activity) * Cough interfering with sleep two time or less a month * Oral steroids no more than once a year * No hospitalizations   3. Chronic allergic rhinitis (sweet vernal grass, box elder, cat, weeds, ragweed, molds, cockroach, dust mite) - Continue with aszelastine/fluticasone 2 sprays per nostril up to twice daily. - Continue with saline mist 1-2 times daily.  - Continue with cetirizine '10mg'$  daily as needed for breakthrough symptoms. - Continue with montelukast '10mg'$  in the evening.   4. GERD - Continue famotidine as needed.  5. Return in about 2 months (around 03/04/2022).     Please inform us of any Emergency Department visits, hospitalizations, or changes in symptoms. Call us before going to the ED for breathing or allergy symptoms since we might be able to  fit you in for a sick visit. Feel free to contact us anytime with any questions, problems, or concerns.  It was a pleasure to see you again today!  Websites that have reliable patient information: 1. American Academy of Asthma, Allergy, and Immunology: www.aaaai.org 2. Food Allergy Research and Education (FARE): foodallergy.org 3. Mothers of Asthmatics: http://www.asthmacommunitynetwork.org 4. American College of Allergy, Asthma, and Immunology: www.acaai.org   COVID-19 Vaccine Information can be found at: ShippingScam.co.uk For questions related to vaccine distribution or appointments, please email [email or web address] or call [phone].   We realize that you might be concerned about having an allergic reaction to the COVID19 vaccines. To help with that concern, WE ARE OFFERING THE COVID19 VACCINES IN OUR OFFICE! Ask the front desk for dates!     "Like" [place] on Facebook and Instagram for our latest updates!      A healthy democracy works best when [employer] participate! Make sure you are registered to vote! If you have moved or changed any of your contact information, you will need to get this updated before voting!  In some cases, you MAY be able to register to vote online: [email or web address]

## 2022-01-05 ENCOUNTER — Other Ambulatory Visit: Payer: Self-pay | Admitting: Allergy & Immunology

## 2022-01-05 ENCOUNTER — Encounter: Payer: Self-pay | Admitting: Allergy & Immunology

## 2022-01-05 MED ORDER — FORMOTEROL FUMARATE 20 MCG/2ML IN NEBU: 20.0000 ug | INHALATION_SOLUTION | Freq: Two times a day (BID) | RESPIRATORY_TRACT | 5 refills | Status: DC

## 2022-01-06 ENCOUNTER — Telehealth: Payer: Self-pay

## 2022-01-06 NOTE — Telephone Encounter (Signed)
Faxed completed PA form to [facility] for Synv[employer], bilateral knee at [phone]. PA pending

## 2022-01-06 NOTE — Telephone Encounter (Signed)
Please advise 

## 2022-01-07 NOTE — Procedures (Signed)
Lumbar Diagnostic Facet Joint Nerve Block with Fluoroscopic Guidance   Patient: Mark Harrell.      Date of Birth: 11-25-1960 MRN: [id number] PCP: [name], MD      Visit Date: 12/30/2021   Universal Protocol:    Date/Time: 08/15/236:55 AM  Consent Given By: the patient  Position: PRONE  Additional Comments: Vital signs were monitored before and after the procedure. Patient was prepped and draped in the usual sterile fashion. The correct patient, procedure, and site was verified.   Injection Procedure Details:   Procedure diagnoses:  1. Spondylosis without myelopathy or radiculopathy, lumbar region      Meds Administered:  Meds ordered this encounter  Medications   bupivacaine (MARCAINE) 0.5 % (with pres) injection 3 mL     Laterality: Bilateral  Location/Site: L4-L5, L3 and L4 medial branches and L5-S1, L4 medial branch and L5 dorsal ramus  Needle: 5.0 in., 25 ga.  Short bevel or Quincke spinal needle  Needle Placement: Oblique pedical  Findings:   -Comments: There was excellent flow of contrast along the articular pillars without intravascular flow.  Procedure Details: The fluoroscope beam is vertically oriented in AP and then obliqued 15 to 20 degrees to the ipsilateral side of the desired nerve to achieve the "Scotty dog" appearance.  The skin over the target area of the junction of the superior articulating process and the transverse process (sacral ala if blocking the L5 dorsal rami) was locally anesthetized with a 1 ml volume of 1% Lidocaine without Epinephrine.  The spinal needle was inserted and advanced in a trajectory view down to the target.   After contact with periosteum and negative aspirate for blood and CSF, correct placement without intravascular or epidural spread was confirmed by injecting 0.5 ml. of Isovue-250.  A spot radiograph was obtained of this image.    Next, a 0.5 ml. volume of the injectate described above was injected. The  needle was then redirected to the other facet joint nerves mentioned above if needed.  Prior to the procedure, the patient was given a Pain Diary which was completed for baseline measurements.  After the procedure, the patient rated their pain every 30 minutes and will continue rating at this frequency for a total of 5 hours.  The patient has been asked to complete the Diary and return to [place] by mail, fax or hand delivered as soon as possible.   Additional Comments:  The patient tolerated the procedure well Dressing: 2 x 2 sterile gauze and Band-Aid    Post-procedure details: Patient was observed during the procedure. Post-procedure instructions were reviewed.  Patient left the clinic in stable condition.

## 2022-01-07 NOTE — Progress Notes (Signed)
Mark Harrell. - 61 y.o. male MRN [id number]  Date of birth: Nov 04, 1960  Office Visit Note: Visit Date: 12/30/2021 PCP: [name], MD Referred by: [name], MD  Subjective: Chief Complaint  Patient presents with   Lower Back - Pain   Right Leg - Pain   HPI:  Mark Leichter. is a 61 y.o. male who comes in today for planned repeat Bilateral L4-5 and L5-S1 Lumbar facet/medial branch block with fluoroscopic guidance.  The patient has failed conservative care including home exercise, medications, time and activity modification.  This injection will be diagnostic and hopefully therapeutic.  Please see requesting physician notes for further details and justification.  Exam shows concordant low back pain with facet joint loading and extension. Patient received more than 80% pain relief from prior injection. This would be the second block in a diagnostic double block paradigm.     Referring:Dr. Jean Rosenthal and Barnet Pall, FNP   ROS Otherwise per HPI.  Assessment & Plan: Visit Diagnoses:    ICD-10-CM   1. Spondylosis without myelopathy or radiculopathy, lumbar region  M47.816 XR C-ARM NO REPORT    Facet Injection    bupivacaine (MARCAINE) 0.5 % (with pres) injection 3 mL      Plan: No additional findings.   Meds & Orders:  Meds ordered this encounter  Medications   bupivacaine (MARCAINE) 0.5 % (with pres) injection 3 mL    Orders Placed This Encounter  Procedures   Facet Injection   XR C-ARM NO REPORT    Follow-up: Return for Review Pain Diary.   Procedures: No procedures performed  Lumbar Diagnostic Facet Joint Nerve Block with Fluoroscopic Guidance   Patient: Mark Harrell.      Date of Birth: 06-Feb-1961 MRN: [id number] PCP: [name], MD      Visit Date: 12/30/2021   Universal Protocol:    Date/Time: 08/15/236:55 AM  Consent Given By: the patient  Position: PRONE  Additional Comments: Vital signs were monitored before and  after the procedure. Patient was prepped and draped in the usual sterile fashion. The correct patient, procedure, and site was verified.   Injection Procedure Details:   Procedure diagnoses:  1. Spondylosis without myelopathy or radiculopathy, lumbar region      Meds Administered:  Meds ordered this encounter  Medications   bupivacaine (MARCAINE) 0.5 % (with pres) injection 3 mL     Laterality: Bilateral  Location/Site: L4-L5, L3 and L4 medial branches and L5-S1, L4 medial branch and L5 dorsal ramus  Needle: 5.0 in., 25 ga.  Short bevel or Quincke spinal needle  Needle Placement: Oblique pedical  Findings:   -Comments: There was excellent flow of contrast along the articular pillars without intravascular flow.  Procedure Details: The fluoroscope beam is vertically oriented in AP and then obliqued 15 to 20 degrees to the ipsilateral side of the desired nerve to achieve the "Scotty dog" appearance.  The skin over the target area of the junction of the superior articulating process and the transverse process (sacral ala if blocking the L5 dorsal rami) was locally anesthetized with a 1 ml volume of 1% Lidocaine without Epinephrine.  The spinal needle was inserted and advanced in a trajectory view down to the target.   After contact with periosteum and negative aspirate for blood and CSF, correct placement without intravascular or epidural spread was confirmed by injecting 0.5 ml. of Isovue-250.  A spot radiograph was obtained of this image.    Next,  a 0.5 ml. volume of the injectate described above was injected. The needle was then redirected to the other facet joint nerves mentioned above if needed.  Prior to the procedure, the patient was given a Pain Diary which was completed for baseline measurements.  After the procedure, the patient rated their pain every 30 minutes and will continue rating at this frequency for a total of 5 hours.  The patient has been asked to complete the  Diary and return to [place] by mail, fax or hand delivered as soon as possible.   Additional Comments:  The patient tolerated the procedure well Dressing: 2 x 2 sterile gauze and Band-Aid    Post-procedure details: Patient was observed during the procedure. Post-procedure instructions were reviewed.  Patient left the clinic in stable condition.    Clinical History: No specialty comments available.     Objective:  VS:  HT:    WT:   BMI:     BP:(!) 142/85  HR:70bpm  TEMP: ( )  RESP:  Physical Exam Vitals and nursing note reviewed.  Constitutional:      General: He is not in acute distress.    Appearance: Normal appearance. He is not ill-appearing.  HENT:     Head: Normocephalic and atraumatic.     Right Ear: External ear normal.     Left Ear: External ear normal.     Nose: No congestion.  Eyes:     Extraocular Movements: Extraocular movements intact.  Cardiovascular:     Rate and Rhythm: Normal rate.     Pulses: Normal pulses.  Pulmonary:     Effort: Pulmonary effort is normal. No respiratory distress.  Abdominal:     General: There is no distension.     Palpations: Abdomen is soft.  Musculoskeletal:        General: No tenderness or signs of injury.     Cervical back: Neck supple.     Right lower leg: No edema.     Left lower leg: No edema.     Comments: Patient has good distal strength without clonus. Patient somewhat slow to rise from a seated position to full extension.  There is concordant low back pain with facet loading and lumbar spine extension rotation.  There are no definitive trigger points but the patient is somewhat tender across the lower back and PSIS.  There is no pain with hip rotation.  Skin:    Findings: No erythema or rash.  Neurological:     General: No focal deficit present.     Mental Status: He is alert and oriented to person, place, and time.     Sensory: No sensory deficit.     Motor: No weakness or abnormal muscle tone.     Coordination:  Coordination normal.  Psychiatric:        Mood and Affect: Mood normal.        Behavior: Behavior normal.      Imaging: No results found.

## 2022-01-08 DIAGNOSIS — K5730 Diverticulosis of large intestine without perforation or abscess without bleeding: Secondary | ICD-10-CM | POA: Diagnosis not present

## 2022-01-08 DIAGNOSIS — K648 Other hemorrhoids: Secondary | ICD-10-CM | POA: Diagnosis not present

## 2022-01-08 DIAGNOSIS — Z86010 Personal history of colonic polyps: Secondary | ICD-10-CM | POA: Diagnosis not present

## 2022-01-08 DIAGNOSIS — Z860101 Personal history of adenomatous and serrated colon polyps: Secondary | ICD-10-CM | POA: Insufficient documentation

## 2022-01-08 DIAGNOSIS — K580 Irritable bowel syndrome with diarrhea: Secondary | ICD-10-CM | POA: Diagnosis not present

## 2022-01-09 ENCOUNTER — Other Ambulatory Visit: Payer: Self-pay | Admitting: Allergy & Immunology

## 2022-01-09 ENCOUNTER — Encounter: Payer: BC Managed Care – PPO | Admitting: Internal Medicine

## 2022-01-09 NOTE — Telephone Encounter (Signed)
Please advise. No longer covered

## 2022-01-10 ENCOUNTER — Telehealth: Payer: Self-pay | Admitting: Physical Medicine and Rehabilitation

## 2022-01-10 DIAGNOSIS — G8929 Other chronic pain: Secondary | ICD-10-CM

## 2022-01-10 DIAGNOSIS — M47816 Spondylosis without myelopathy or radiculopathy, lumbar region: Secondary | ICD-10-CM

## 2022-01-10 NOTE — Telephone Encounter (Signed)
Patient called. Returning a call to schedule with Dr. Newton.  ?

## 2022-01-13 ENCOUNTER — Other Ambulatory Visit: Payer: Self-pay | Admitting: Nurse Practitioner

## 2022-01-13 ENCOUNTER — Other Ambulatory Visit: Payer: Self-pay | Admitting: Specialist

## 2022-01-13 DIAGNOSIS — S161XXD Strain of muscle, fascia and tendon at neck level, subsequent encounter: Secondary | ICD-10-CM

## 2022-01-14 ENCOUNTER — Telehealth: Payer: Self-pay | Admitting: Physical Medicine and Rehabilitation

## 2022-01-14 NOTE — Telephone Encounter (Signed)
Patient called. He would like a referral for PT. His call back number is [phone]

## 2022-01-14 NOTE — Telephone Encounter (Signed)
PT ordered.

## 2022-01-17 ENCOUNTER — Other Ambulatory Visit: Payer: Self-pay | Admitting: Nurse Practitioner

## 2022-01-23 ENCOUNTER — Ambulatory Visit: Payer: BC Managed Care – PPO | Admitting: Physical Therapy

## 2022-01-28 ENCOUNTER — Encounter: Payer: Self-pay | Admitting: Emergency Medicine

## 2022-01-28 ENCOUNTER — Ambulatory Visit: Payer: BC Managed Care – PPO | Admitting: Emergency Medicine

## 2022-01-28 DIAGNOSIS — R918 Other nonspecific abnormal finding of lung field: Secondary | ICD-10-CM | POA: Insufficient documentation

## 2022-01-28 DIAGNOSIS — R9389 Abnormal findings on diagnostic imaging of other specified body structures: Secondary | ICD-10-CM | POA: Diagnosis not present

## 2022-01-28 NOTE — Patient Instructions (Signed)
We reviewed your CT scans of the chest today. We will plan to perform a CT/PET scan in October 2023 to compare with your priors.  Depending on the results of the scan we will decide whether to pursue possible bronchoscopy to evaluate bilateral pulmonary nodules. We will postpone your follow-up with Dr. [name] until after the scans and your follow-up with Dr. Lamonte Sakai. Follow Dr. Lamonte Sakai in October after your CT/PET so we can discuss.

## 2022-01-28 NOTE — Progress Notes (Signed)
Subjective:    Patient ID: Mark Harrell., male    DOB: 07/22/1960, 61 y.o.   MRN: [id number]  HPI 61 year old former smoker (<1 pk/yr)  who has been followed in our office for asthma/COPD, OSA no longer on CPAP.  Has been treated in the past with Biologics including Nucala, Dupixent.  He is referred today for abnormal CT scan of the chest. He has had a complicated 5732, was diagnosed with COVID-pneumonia in 05/2021, then with bilateral pneumonia in 08/2021 requiring admission in May.  He had a DVT without PE and was started on Eliquis during that hospitalization.  His CT scan has never really cleared in fact he has had progressive nodular infiltrates noted as below, question organizing pneumonia versus possible malignancy. He states that he has ups and downs w his breathing, may be feeling more SOB, more run-down than after her was discharged in May. He is on trelegy, uses albuterol and duoneb as needed, about 1-2x a day. Unable to tolerate CPAP.   CT chest 12/12/2021 reviewed by me shows confluent masslike nodular opacities in the medial subpleural bilateral lower lobes, slightly improved compared with prior 09/2021 but persistent.  Largest component in the deep medial right costophrenic recess 2.9 cm.   Review of Systems As per HPI  Past Medical History:  Diagnosis Date   Anesthesia complication requiring reversal agent administration    ? from central apnea, very difficult to get off vent   Anxiety    Arthritis    osteo   Asthma    BPH (benign prostatic hyperplasia)    Complication of anesthesia    difficulty waking , they twlight me because of my respiratory problems "   Depression    Dyspnea    on exertion   Enlarged heart    Family history of adverse reaction to anesthesia    mother trouble waking up, and heart stopped   GERD (gastroesophageal reflux disease)    Headache    botox injections for headaches   Hyperlipidemia    Hypertension    Hypogonadism male    IBS  (irritable bowel syndrome)    Memory difficulties    short term memories   Neuropathy    Obesity    On home oxygen therapy    on 2 liter   OSA (obstructive sleep apnea)    not using cpap   Paralysis (HCC)    left hand - small   Pneumonia    Pre-diabetes    Prostatitis      Family History  Problem Relation Age of Onset   Diabetes Paternal Uncle    Cancer Father        lymphoma, colon   Diabetes Maternal Grandmother    Heart disease Maternal Grandfather    Diabetes Maternal Grandfather    Diabetes Paternal Grandmother    Diabetes Paternal Grandfather    Dementia Mother    Prostate cancer Maternal Uncle    Lung disease Neg Hx    Rheumatologic disease Neg Hx      Social History   Socioeconomic History   Marital status: Single    Spouse name: Not on file   Number of children: Not on file   Years of education: Not on file   Highest education level: Not on file  Occupational History   Occupation: Dance movement psychotherapist  Tobacco Use   Smoking status: Former    Packs/day: 0.10    Years: 15.00    Total pack years: 1.50  Types: Cigarettes    Quit date: 05/27/1983    Years since quitting: 38.7    Passive exposure: Never   Smokeless tobacco: Never   Tobacco comments:    significant second-hand exposure through mother  Vaping Use   Vaping Use: Never used  Substance and Sexual Activity   Alcohol use: Yes    Alcohol/week: 1.0 standard drink of alcohol    Types: 1 Glasses of wine per week    Comment: 2 x a year   Drug use: No   Sexual activity: Never    Birth control/protection: None  Other Topics Concern   Not on file  Social History Narrative   [facility]:   Originally from [place]. Previously has lived in [place]. He has lived in [place], [place], & [place]. He has worked in Engineer, production. No pets currently. Brief exposure to a roommates bird Secretary/administrator) in college. No mold, asbestos, or hot tub exposure.    Social Determinants of Health    Financial Resource Strain: Not on file  Food Insecurity: No Food Insecurity (10/08/2021)   Hunger Vital Sign    Worried About Running Out of Food in the Last Year: Never true    Ran Out of Food in the Last Year: Never true  Transportation Needs: No Transportation Needs (10/08/2021)   PRAPARE - Hydrologist (Medical): No    Lack of Transportation (Non-Medical): No  Physical Activity: Not on file  Stress: Not on file  Social Connections: Not on file  Intimate Partner Violence: Not on file     Allergies  Allergen Reactions   Bee Venom Swelling   Cymbalta [Duloxetine Hcl] Shortness Of Breath    Brought on asthma   Ppd [Tuberculin Purified Protein Derivative] Other (See Comments)    +ppd NEG Quantferron Gold 3/13 (shows false positive)    Calan [Verapamil] Other (See Comments)    Back pain   Tricor [Fenofibrate] Other (See Comments)    Back pain   Claritin [Loratadine] Other (See Comments)    Unknown reaction    Levaquin [Levofloxacin] Diarrhea     Outpatient Medications Prior to Visit  Medication Sig Dispense Refill   acetaminophen (TYLENOL) 500 MG tablet Take 1,000 mg by mouth 3 (three) times daily.     anastrozole (ARIMIDEX) 1 MG tablet Take 1 mg by mouth daily.     apixaban (ELIQUIS) 5 MG TABS tablet Take 1 tablet (5 mg total) by mouth 2 (two) times daily. 60 tablet 1   ascorbic acid (VITAMIN C) 1000 MG tablet Take 1,000 mg by mouth every evening.     aspirin EC 81 MG tablet Take 81 mg by mouth daily. Swallow whole.     Azelastine HCl 137 MCG/SPRAY SOLN USE 1 TO 2 SPRAYS EACH NOSTRIL 2 X /DAY 30 mL 5   benzonatate (TESSALON) 200 MG capsule Take 200 mg by mouth 3 (three) times daily.     Botulinum Toxin Type A 200 units SOLR Inject 200 Units into the skin every 3 (three) months.     budesonide (PULMICORT) 0.5 MG/2ML nebulizer solution TAKE 2 MLS (0.5 MG TOTAL) BY NEBULIZATION 3 (THREE) TIMES DAILY AS NEEDED. 120 mL 5   Calcium Carb-Cholecalciferol  (CALCIUM + VITAMIN D3 PO) Take 1 tablet by mouth daily.     celecoxib (CELEBREX) 200 MG capsule TAKE 1 CAPSULE (200 MG TOTAL) BY MOUTH EVERY 12 (TWELVE) HOURS. 60 capsule 2   cetaphil (CETAPHIL) lotion Apply topically 2 (  two) times daily. (Patient taking differently: Apply 1 application  topically 2 (two) times daily.) 236 mL 0   cetirizine (ZYRTEC) 10 MG tablet Take 1 tablet (10 mg total) by mouth 2 (two) times daily as needed for allergies (Can take an extra dose during flares). (Patient taking differently: Take 10 mg by mouth in the morning.) 60 tablet 5   Cholecalciferol (VITAMIN D3) 125 MCG (5000 UT) TABS Take 10,000 Units by mouth 2 (two) times daily. Morning and evening     Cinnamon 500 MG capsule Take 1,000 mg by mouth 3 (three) times daily.     Coenzyme Q10 (CO Q 10 PO) Take 600 mg by mouth daily.     cyclobenzaprine (FLEXERIL) 10 MG tablet TAKE 1/2 TO 1 TABLET 3 X DAY ONLY IF NEED FOR MUSCLE SPASM (NECK) 90 tablet 0   diclofenac Sodium (VOLTAREN) 1 % GEL APPLY 2 TO 4 GRAMS TOPICALLY 2 TO 4 TIMES DAILY FOR PAIN & INFLAMMATION( OTC NOT COVERED) (Patient taking differently: 2-4 g 2 (two) times daily as needed (pain).) 300 g 3   DM-APAP-CPM (CORICIDIN HBP MAX STRENGTH FLU PO) Take 1 tablet by mouth 3 (three) times daily.     donepezil (ARICEPT) 23 MG TABS tablet Take 23 mg by mouth daily.     doxycycline (ADOXA) 100 MG tablet Take 100 mg by mouth 2 (two) times daily. prophylactic     EPINEPHrine 0.3 mg/0.3 mL IJ SOAJ injection Inject 0.3 mg into the muscle as needed. (Patient taking differently: Inject 0.3 mg into the muscle as needed for anaphylaxis.) 2 each 2   fluticasone (FLONASE) 50 MCG/ACT nasal spray PLACE 2 SPRAYS INTO BOTH NOSTRILS DAILY. USE 1 TO 2 SPRAYS EACH NOSTRIL 2 X /DAY 16 mL 0   formoterol (PERFOROMIST) 20 MCG/2ML nebulizer solution TAKE 2 MLS (20 MCG TOTAL) BY NEBULIZATION 2 (TWO) TIMES DAILY. OK TO MIX WITH BUDESONIDE 120 mL 5   furosemide (LASIX) 80 MG tablet Take 80 mg by  mouth 2 (two) times daily.     Guaifenesin 1200 MG TB12 Take 1,200 mg by mouth 3 (three) times daily.     ipratropium-albuterol (DUONEB) 0.5-2.5 (3) MG/3ML SOLN TAKE 3 MLS BY NEBULIZATION EVERY 4 (FOUR) HOURS AS NEEDED. 270 mL 1   Krill Oil 500 MG CAPS Take 500 mg by mouth 2 (two) times daily.     Magnesium 500 MG CAPS Take 500 mg by mouth 3 (three) times daily.     memantine (NAMENDA) 10 MG tablet Take 10 mg by mouth 2 (two) times daily.     metFORMIN (GLUCOPHAGE-XR) 500 MG 24 hr tablet Takes 2 tablets  2 x /day  with Meals  for Diabetes (Patient taking differently: Take 1,000 mg by mouth 2 (two) times daily.) 360 tablet 3   metoprolol tartrate (LOPRESSOR) 25 MG tablet Take  1 tablet  2 x /day (every 12 hours)  for BP                                           /                               TAKE                 BY  MOUTH 180 tablet 3   Misc Natural Products (GLUCOS-CHONDROIT-MSM COMPLEX) TABS Take 2 tablets by mouth 3 (three) times daily.     montelukast (SINGULAIR) 10 MG tablet TAKE 1 TABLET BY MOUTH DAILY FOR ALLERGIES 90 tablet 0   Multiple Vitamins-Minerals (MULTIVITAMIN WITH MINERALS) tablet Take 1 tablet by mouth daily.     Multiple Vitamins-Minerals (PRESERVISION AREDS 2) CAPS Take 1 capsule by mouth 2 (two) times daily.     oxymetazoline (AFRIN) 0.05 % nasal spray Place 1 spray into both nostrils at bedtime.     OZEMPIC, 2 MG/DOSE, 8 MG/3ML SOPN SMARTSIG:2 Milligram(s) Topical Once a Week     pravastatin (PRAVACHOL) 40 MG tablet TAKE 1 TABLET BY MOUTH AT BEDTIME FOR CHOLESTEROL (Patient taking differently: Take 40 mg by mouth daily.) 90 tablet 3   pregabalin (LYRICA) 150 MG capsule Take  1 capsule  3 x /day for Chronic Pain                                          /                          TAKE                              BY               MOUTH (Patient taking differently: Take 150 mg by mouth 3 (three) times daily.) 270 capsule 1   PROBIOTIC PRODUCT PO Take 1 capsule by  mouth 2 (two) times daily. VSL     promethazine-dextromethorphan (PROMETHAZINE-DM) 6.25-15 MG/5ML syrup Take  1 teaspoon (5 ml)  every 4 hours  as needed  for Cough                                   /                              TAKE            BY                MOUTH (Patient taking differently: Take 5 mLs by mouth 4 (four) times daily as needed for cough.) 240 mL 3   sertraline (ZOLOFT) 100 MG tablet Take 100 mg by mouth 2 (two) times daily.     sodium chloride (OCEAN) 0.65 % SOLN nasal spray Place 1 spray into both nostrils 4 (four) times daily as needed for congestion. Uses each time before other nasal sprays     tadalafil (CIALIS) 5 MG tablet Take 5 mg by mouth daily.      trospium (SANCTURA) 20 MG tablet Take 20 mg by mouth 2 (two) times daily. In the evening and at bedtime     TURMERIC PO Take 2 tablets by mouth 3 (three) times daily.     VENTOLIN HFA 108 (90 Base) MCG/ACT inhaler INHALE 2 PUFFS BY MOUTH EVERY 4 HOURS AS NEEDED FOR WHEEZE OR FOR SHORTNESS OF BREATH (Patient taking differently: Inhale 2 puffs into the lungs every 4 (four) hours as needed for wheezing or shortness of breath.) 18 each 1   zinc gluconate 50 MG tablet  Take 50 mg by mouth 3 (three) times daily.     potassium chloride SA (KLOR-CON M) 20 MEQ tablet Take 2 tablets (40 mEq total) by mouth 2 (two) times daily. 120 tablet 0   Facility-Administered Medications Prior to Visit  Medication Dose Route Frequency Provider Last Rate Last Admin   bupivacaine (MARCAINE) 0.5 % (with pres) injection 3 mL  3 mL Other Once Magnus Sinning, MD            Objective:   Physical Exam  Vitals:   01/28/22 1028  BP: (!) 144/78  Pulse: 89  Temp: 99.3 F (37.4 C)  TempSrc: Oral  SpO2: 94%  Weight: (!) 361 lb 12.8 oz (164.1 kg)  Height: 6' (1.829 m)   Gen: Pleasant, obese, in no distress,  normal affect  ENT: No lesions,  mouth clear,  oropharynx clear, no postnasal drip  Neck: No JVD, no stridor  Lungs: No use of  accessory muscles, no crackles or wheezing on normal respiration, no wheeze on forced expiration  Cardiovascular: RRR, heart sounds normal, no murmur or gallops, trace peripheral edema  Musculoskeletal: No deformities, no cyanosis or clubbing  Neuro: alert, awake, non focal  Skin: Warm, no lesions or rash      Assessment & Plan:   Abnormal CT of the chest Bilateral lower lobe nodules remain present but are smaller when compared with May.  Differential diagnosis includes possible scarring post his recent pneumonias, autoimmune disease, opportunistic infection.  The fact that these have decreased in size is reassuring at least with regard to malignancy.  I recommended that we evaluate further with a repeat CT and PET scan in October which would give [place] 3 months interval and an evaluation for hypermetabolic activity.  If there is any concerning change in size or if hyper metabolic then we will consider navigational bronchoscopy for tissue, culture data.   Baltazar Apo, MD, PhD 01/28/2022, 11:03 AM [facility] [phone] or if no answer before 7:00PM call [phone] For any issues after 7:00PM please call eLink [phone]

## 2022-01-28 NOTE — Assessment & Plan Note (Signed)
Bilateral lower lobe nodules remain present but are smaller when compared with May.  Differential diagnosis includes possible scarring post his recent pneumonias, autoimmune disease, opportunistic infection.  The fact that these have decreased in size is reassuring at least with regard to malignancy.  I recommended that we evaluate further with a repeat CT and PET scan in October which would give [place] 3 months interval and an evaluation for hypermetabolic activity.  If there is any concerning change in size or if hyper metabolic then we will consider navigational bronchoscopy for tissue, culture data.

## 2022-01-29 ENCOUNTER — Other Ambulatory Visit: Payer: Self-pay | Admitting: Allergy & Immunology

## 2022-01-31 ENCOUNTER — Ambulatory Visit: Payer: BC Managed Care – PPO | Admitting: Pulmonary Disease

## 2022-02-03 ENCOUNTER — Ambulatory Visit: Payer: BC Managed Care – PPO | Admitting: Physical Therapy

## 2022-02-04 DIAGNOSIS — H53143 Visual discomfort, bilateral: Secondary | ICD-10-CM | POA: Diagnosis not present

## 2022-02-04 LAB — HM DIABETES EYE EXAM

## 2022-02-07 ENCOUNTER — Encounter: Payer: Self-pay | Admitting: Internal Medicine

## 2022-02-11 ENCOUNTER — Ambulatory Visit (INDEPENDENT_AMBULATORY_CARE_PROVIDER_SITE_OTHER): Payer: BC Managed Care – PPO | Admitting: Physical Therapy

## 2022-02-11 ENCOUNTER — Other Ambulatory Visit: Payer: Self-pay

## 2022-02-11 ENCOUNTER — Encounter: Payer: Self-pay | Admitting: Physical Therapy

## 2022-02-11 DIAGNOSIS — M6281 Muscle weakness (generalized): Secondary | ICD-10-CM

## 2022-02-11 DIAGNOSIS — R262 Difficulty in walking, not elsewhere classified: Secondary | ICD-10-CM

## 2022-02-11 DIAGNOSIS — M5459 Other low back pain: Secondary | ICD-10-CM

## 2022-02-11 NOTE — Therapy (Signed)
OUTPATIENT PHYSICAL THERAPY THORACOLUMBAR EVALUATION   Patient Name: Mark Harrell. MRN: [id number] DOB:Jul 08, 1960, 61 y.o., male Today's Date: 02/11/2022   PT End of Session - 02/11/22 1639     Visit Number 1    Number of Visits 8    Date for PT Re-Evaluation 04/08/22    Authorization Type [employer]    Authorization - Visit Number 1    Authorization - Number of Visits 30    PT Start Time 1600    PT Stop Time 1639    PT Time Calculation (min) 39 min    Activity Tolerance Patient limited by pain    Behavior During Therapy WFL for tasks assessed/performed             Past Medical History:  Diagnosis Date   Anesthesia complication requiring reversal agent administration    ? from central apnea, very difficult to get off vent   Anxiety    Arthritis    osteo   Asthma    BPH (benign prostatic hyperplasia)    Complication of anesthesia    difficulty waking , they twlight me because of my respiratory problems "   Depression    Dyspnea    on exertion   Enlarged heart    Family history of adverse reaction to anesthesia    mother trouble waking up, and heart stopped   GERD (gastroesophageal reflux disease)    Headache    botox injections for headaches   Hyperlipidemia    Hypertension    Hypogonadism male    IBS (irritable bowel syndrome)    Memory difficulties    short term memories   Neuropathy    Obesity    On home oxygen therapy    on 2 liter   OSA (obstructive sleep apnea)    not using cpap   Paralysis (HCC)    left hand - small   Pneumonia    Pre-diabetes    Prostatitis    Past Surgical History:  Procedure Laterality Date   ABDOMINAL SURGERY     ANKLE FRACTURE SURGERY Right    ANTERIOR CERVICAL DECOMP/DISCECTOMY FUSION N/A 05/23/2019   Procedure: ANTERIOR CERVICAL DISCECTOMY FUSION CERVICAL FIVE THROUGH CERVICAL SIX AND CERVICAL SIX THROUGH CERVICAL SEVEN;  Surgeon: Jessy Oto, MD;  Location: [facility];  Service: Orthopedics;  Laterality: N/A;    COLONOSCOPY     CYSTOSCOPY     [place]   KNEE ARTHROSCOPY WITH MEDIAL MENISECTOMY Left 01/02/2017   Procedure: LEFT KNEE ARTHROSCOPY WITH PARTIAL MEDIAL MENISCECTOMY;  Surgeon: Mcarthur Rossetti, MD;  Location: [facility];  Service: Orthopedics;  Laterality: Left;   POSTERIOR CERVICAL FUSION/FORAMINOTOMY N/A 08/20/2020   Procedure: LEFT CERVICAL SIX THROUGH SEVEN  AND CERVICAL SEVEN THROUGH THORACIC ONE  FORAMINOTOMIES WITH EXCISION OF [place] HERNIATION LEFT CERVICAL SEVEN THROUGH THORACIC ONE;  Surgeon: Jessy Oto, MD;  Location: [facility];  Service: Orthopedics;  Laterality: N/A;   TONSILLECTOMY     TURBINATE RESECTION  2007   UVULOPALATOPHARYNGOPLASTY     Patient Active Problem List   Diagnosis Date Noted   Abnormal CT of the chest 01/28/2022   Hypokalemia 09/26/2021   Dyspnea 09/25/2021   Acute deep vein thrombosis (DVT) of right lower extremity ([place]) 09/24/2021   OSA (obstructive sleep apnea) 09/24/2021   Herniation of cervical intervertebral disc with radiculopathy    Status post cervical discectomy 08/20/2020   Tracheomalacia 05/26/2019   Acquired tracheomalacia 05/26/2019   HNP (herniated nucleus pulposus) with myelopathy, cervical 05/23/2019  Class: Chronic   Spinal stenosis of cervical region 05/23/2019    Class: Chronic   Status post cervical spinal fusion 05/23/2019   Chronic respiratory failure with hypoxia (HCC) 04/12/2018   Bilateral lower extremity edema 12/29/2017   Hymenoptera allergy 11/10/2017   Family history of colonic polyps 08/07/2017   Myofascial pain 08/06/2017   Seasonal and perennial allergic rhinitis 04/23/2017   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/2017   Cervicalgia 04/01/2017   Recurrent pneumonia 03/09/2017   Other spondylosis with radiculopathy, cervical region 02/09/2017   Depression with anxiety 01/04/2017   Memory difficulty 12/05/2016   Morbid obesity ([place]) 12/05/2016   Migraine without aura and without status migrainosus, not intractable  10/30/2016   Lumbar radiculopathy 05/15/2016   Osteoarthritis of spine with radiculopathy, lumbar region 05/15/2016   Risk for falls 05/15/2016   Recurrent infections 03/29/2016   Chronic nonseasonal allergic rhinitis due to pollen 03/29/2016   Polypharmacy 01/16/2016   Morbid obesity with BMI of 45.0-49.9, adult ([place]) 03/06/2015   SDAT 02/05/2015   OSA and COPD overlap syndrome ([place]) 02/05/2015   Medication management 08/02/2014   GERD (gastroesophageal reflux disease) 05/09/2014   Vitamin D deficiency 08/01/2013   Prediabetes 08/01/2013   Positive TB test 07/29/2011   Diverticula of colon 05/07/2011   Hypertension 01/31/2011   Hyperlipidemia, mixed 01/31/2011   BPH (benign prostatic hyperplasia) 01/31/2011   Testosterone Deficiency 01/31/2011   IBS (irritable bowel syndrome) 01/31/2011   Partial complex seizure disorder with intractable epilepsy ([place]) 01/31/2011   Depression, major, recurrent, in partial remission ([place]) 01/31/2011   Asthma-COPD overlap syndrome ([place]) 01/31/2011    PCP: [name], MD  REFERRING PROVIDER: Magnus Sinning, MD  REFERRING DIAG: [phone] (ICD-10-CM) - Spondylosis without myelopathy or radiculopathy, lumbar region M54.50,G89.29 (ICD-10-CM) - Chronic bilateral low back pain without sciatica  Rationale for Evaluation and Treatment Rehabilitation  THERAPY DIAG:  Other low back pain  Muscle weakness (generalized)  Difficulty in walking, not elsewhere classified  ONSET DATE: Chronic back pain since 20 years ago  SUBJECTIVE:                                                                                                                                                                                           SUBJECTIVE STATEMENT: He relays gradual onset of back pain 20 years ago and then he had a fall in 2019 and the pain became severe. He has had 2 neck surgeries but has not had a back surgery. He has used [facility] for ambulation for last 10  years. He also has had a chronic history of pain in neck and both knees. He relays he had ankylosing  spondylosis or DISH syndrome.  PERTINENT HISTORY: PMH includes:  morbid obesity, HTN, pre-diabetes, COPD/ Asthma / OSA,knee OA,DVT,   PAIN:  Are you having pain? Yes: NPRS scale: 6 currently /10 Pain location: low and mid back on both sides Pain description: sharp Aggravating factors: standing, walking, leaning over, lifting, carrying Relieving factors: sleeping/sitting in recliner, ice   PRECAUTIONS: None  WEIGHT BEARING RESTRICTIONS No  FALLS:  Has patient fallen in last 6 months? No  OCCUPATION: : computer work  PLOF: Needs assistance with ADLs for cooking and cleaning, shopping, he does bathe and dress himself but this is getting more difficult  PATIENT GOALS : decrease pain   OBJECTIVE:   DIAGNOSTIC FINDINGS: Lumbar MRI in chart from 2020, I do not see any more recent imaging   PATIENT SURVEYS:  FOTO 34% functional  COGNITION:  Overall cognitive status: Within functional limits for tasks assessed     SENSATION: WFL  MUSCLE LENGTH:   POSTURE: slumped posture  PALPATION: Tender to palpation   LUMBAR ROM:   Active  A/PROM  eval  Flexion 25%  Extension 10%  Right lateral flexion   Left lateral flexion   Right rotation 25%  Left rotation 25%   (Blank rows = not tested)  LOWER EXTREMITY MMT:     MMT in sittting Right eval Left eval  Hip flexion 3 3  Hip extension    Hip abduction    Hip adduction    Hip internal rotation    Hip external rotation    Knee flexion 3+ 3+  Knee extension 4- 4-  Ankle dorsiflexion    Ankle plantarflexion    Ankle inversion    Ankle eversion     (Blank rows = not tested)  LOWER EXTREMITY ROM:    AROM Right eval Left eval  Hip flexion    Hip extension    Hip abduction    Hip adduction    Hip internal rotation    Hip external rotation    Knee flexion    Knee extension    Ankle dorsiflexion    Ankle  plantarflexion    Ankle inversion    Ankle eversion     (Blank rows = not tested)  LUMBAR SPECIAL TESTS:  Slump test: Positive  FUNCTIONAL TESTS:  Timed up and go (TUG): 29 sec with SPC  GAIT: Distance walked: limited Buyer, retail device utilized: Single point cane Level of assistance: Modified independence Comments: antalgic gait, slow velocity, wider BOS  TODAY'S TREATMENT:  Eval HEP creation and review, see below for details Cold pack X10 min  HOME EXERCISE PROGRAM: Access Code: [email or web address] URL: [email or web address] Date: 02/11/2022 Prepared by: Elsie Ra  Exercises - Slump Stretch  - 2 x daily - 6 x weekly - 1 sets - 10 reps - 3 hold - Seated Lumbar Flexion Stretch  - 2 x daily - 6 x weekly - 1 sets - 10 reps - 5 hold - Seated Quadratus Lumborum Stretch in Chair  - 2 x daily - 6 x weekly - 1 sets - 10 reps - 5 hold - Seated Straight Leg Raise with Quad Contraction  - 2 x daily - 6 x weekly - 1-2 sets - 10 reps - Standing Hip Abduction with Counter Support  - 2 x daily - 6 x weekly - 1 sets - 10 reps - Standing Lumbar Extension  - 2 x daily - 6 x weekly - 1 sets - 10 reps - 5 hold  PATIENT EDUCATION: Education details: HEP, PT plan of care Person educated: Patient Education method: Explanation, Demonstration, Verbal cues, and Handouts Education comprehension: verbalized understanding and needs further education   ASSESSMENT:  CLINICAL IMPRESSION: Patient referred to PT for chronic back pain and  Spondylosis. He has general weakness and difficulty for standing and walking.  Patient will benefit from skilled PT to address below impairments, limitations and improve overall function.  OBJECTIVE IMPAIRMENTS: decreased activity tolerance, difficulty walking, decreased balance, decreased endurance, decreased mobility, decreased ROM, decreased strength, impaired flexibility, impaired UE/LE use, postural dysfunction, and pain.  ACTIVITY  LIMITATIONS: bending, lifting, carry, locomotion, cleaning, community activity, driving  PERSONAL FACTORS:  PMH includes:  morbid obesity, HTN, pre-diabetes, COPD/ Asthma / OSA,knee OA,DVT,are also affecting patient's functional outcome.  REHAB POTENTIAL: Fair    CLINICAL DECISION MAKING: Evolving/moderate complexity  EVALUATION COMPLEXITY: Moderate    GOALS: Short term PT Goals Target date: 03/11/2022 Pt will be I and compliant with HEP. Baseline:  Goal status: New Pt will decrease pain by 25% overall Baseline: Goal status: New  Long term PT goals Target date: 04/08/2022 Pt will improve lumbar ROM to at least 50% available to improve functional mobility Baseline: Goal status: New Pt will improve  hip/knee strength to at least 4+/5 MMT in sitting to improve functional strength Baseline: Goal status: New Pt will improve FOTO to at least 37% functional to show improved function Baseline: Goal status: New Pt will reduce pain by overall 50% overall with usual activity Baseline: Goal status: New Pt will reduce TUG score to less than 20 seconds to show improved gait speed and balance. Baseline: Goal status: New  PLAN: PT FREQUENCY: 1 times per week   PT DURATION: 4-8 weeks  PLANNED INTERVENTIONS (unless contraindicated): aquatic PT, Canalith repositioning, cryotherapy, Electrical stimulation, Iontophoresis with 4 mg/ml dexamethasome, Moist heat, traction, Ultrasound, gait training, Therapeutic exercise, balance training, neuromuscular re-education, patient/family education, prosthetic training, manual techniques, passive ROM, dry needling, taping, vasopnuematic device, vestibular, spinal manipulations, joint manipulations  PLAN FOR NEXT SESSION: review HEP, needs lumbar mobility and general strength progressions as able.

## 2022-02-12 ENCOUNTER — Other Ambulatory Visit: Payer: Self-pay | Admitting: Nurse Practitioner

## 2022-02-12 DIAGNOSIS — S161XXD Strain of muscle, fascia and tendon at neck level, subsequent encounter: Secondary | ICD-10-CM

## 2022-02-12 NOTE — Telephone Encounter (Signed)
No longer covered and request has been sent by [facility]

## 2022-02-12 NOTE — Telephone Encounter (Signed)
Can we send in [facility] instead? They are better at filling these medications.   [name], MD [facility]

## 2022-02-18 ENCOUNTER — Ambulatory Visit (INDEPENDENT_AMBULATORY_CARE_PROVIDER_SITE_OTHER): Payer: BC Managed Care – PPO | Admitting: Internal Medicine

## 2022-02-18 ENCOUNTER — Encounter: Payer: Self-pay | Admitting: Internal Medicine

## 2022-02-18 VITALS — BP 128/78 | HR 89 | Temp 97.7°F | Resp 16 | Ht 72.0 in | Wt 351.2 lb

## 2022-02-18 DIAGNOSIS — Z8249 Family history of ischemic heart disease and other diseases of the circulatory system: Secondary | ICD-10-CM

## 2022-02-18 DIAGNOSIS — Z79899 Other long term (current) drug therapy: Secondary | ICD-10-CM | POA: Diagnosis not present

## 2022-02-18 DIAGNOSIS — Z120 Encounter for screening for malignant neoplasm of stomach: Secondary | ICD-10-CM | POA: Diagnosis not present

## 2022-02-18 DIAGNOSIS — R350 Frequency of micturition: Secondary | ICD-10-CM

## 2022-02-18 DIAGNOSIS — N401 Enlarged prostate with lower urinary tract symptoms: Secondary | ICD-10-CM

## 2022-02-18 DIAGNOSIS — Z1211 Encounter for screening for malignant neoplasm of colon: Secondary | ICD-10-CM

## 2022-02-18 DIAGNOSIS — Z0000 Encounter for general adult medical examination without abnormal findings: Secondary | ICD-10-CM

## 2022-02-18 DIAGNOSIS — E559 Vitamin D deficiency, unspecified: Secondary | ICD-10-CM | POA: Diagnosis not present

## 2022-02-18 DIAGNOSIS — Z125 Encounter for screening for malignant neoplasm of prostate: Secondary | ICD-10-CM | POA: Diagnosis not present

## 2022-02-18 DIAGNOSIS — Z1389 Encounter for screening for other disorder: Secondary | ICD-10-CM

## 2022-02-18 DIAGNOSIS — I700 Atherosclerosis of aorta: Secondary | ICD-10-CM | POA: Diagnosis not present

## 2022-02-18 DIAGNOSIS — Z13220 Encounter for screening for lipoid disorders: Secondary | ICD-10-CM

## 2022-02-18 DIAGNOSIS — Z111 Encounter for screening for respiratory tuberculosis: Secondary | ICD-10-CM

## 2022-02-18 DIAGNOSIS — Z131 Encounter for screening for diabetes mellitus: Secondary | ICD-10-CM

## 2022-02-18 DIAGNOSIS — Z136 Encounter for screening for cardiovascular disorders: Secondary | ICD-10-CM

## 2022-02-18 DIAGNOSIS — Z0001 Encounter for general adult medical examination with abnormal findings: Secondary | ICD-10-CM

## 2022-02-18 DIAGNOSIS — I10 Essential (primary) hypertension: Secondary | ICD-10-CM | POA: Diagnosis not present

## 2022-02-18 DIAGNOSIS — E1122 Type 2 diabetes mellitus with diabetic chronic kidney disease: Secondary | ICD-10-CM

## 2022-02-18 DIAGNOSIS — N181 Chronic kidney disease, stage 1: Secondary | ICD-10-CM

## 2022-02-18 DIAGNOSIS — G4733 Obstructive sleep apnea (adult) (pediatric): Secondary | ICD-10-CM

## 2022-02-18 DIAGNOSIS — N138 Other obstructive and reflux uropathy: Secondary | ICD-10-CM

## 2022-02-18 DIAGNOSIS — R5383 Other fatigue: Secondary | ICD-10-CM

## 2022-02-18 DIAGNOSIS — E1169 Type 2 diabetes mellitus with other specified complication: Secondary | ICD-10-CM
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
## 2022-02-18 NOTE — Patient Instructions (Signed)
Due to recent changes in healthcare laws, you may see the results of your imaging and laboratory studies on MyChart before your provider has had a chance to review them.  We understand that in some cases there may be results that are confusing or concerning to you. Not all laboratory results come back in the same time frame and the provider may be waiting for multiple results in order to interpret others.  Please give us 48 hours in order for your provider to thoroughly review all the results before contacting the office for clarification of your results.  ° °++++++++++++++++++++++++++++++++++++++ ° Vit D  & °Vit C 1,000 mg   °are recommended to help protect  °against the Covid-19 and other Corona viruses.  ° ° Also it's recommended  °to take  °Zinc 50 mg  °to help  °protect against the Covid-19   °and best place to get ° is also on [employer]  °and don't pay more than 6-8 cents /pill !  °=============================== °Coronavirus (COVID-19) Are you at risk? ° °Are you at risk for the Coronavirus (COVID-19)? ° °To be considered HIGH RISK for Coronavirus (COVID-19), you have to meet the following criteria: ° °Traveled to [place], [place], [place], [place] or [place]; or in the [place] to [place], [place], [place]  °or [place]; and have fever, cough, and shortness of breath within the last 2 weeks of travel OR °Been in close contact with a person diagnosed with COVID-19 within the last 2 weeks and have  °fever, cough,and shortness of breath ° °IF YOU DO NOT MEET THESE CRITERIA, YOU ARE CONSIDERED LOW RISK FOR COVID-19. ° °What to do if you are HIGH RISK for COVID-19? ° °If you are having a medical emergency, call 911. °Seek medical care right away. Before you go to a doctor’s office, urgent care or emergency department, ° call ahead and tell them about your recent travel, contact with someone diagnosed with COVID-19  ° and your symptoms.  °You should receive instructions from your physician’s office  regarding next steps of care.  °When you arrive at healthcare provider, tell the healthcare staff immediately you have returned from  °visiting [place], [place], [place], [place] or [place]; or traveled in the [place] to [place], [place],  °[place] or [place] in the last two weeks or you have been in close contact with a person diagnosed with  °COVID-19 in the last 2 weeks.   °Tell the health care staff about your symptoms: fever, cough and shortness of breath. °After you have been seen by a medical provider, you will be either: °Tested for (COVID-19) and discharged home on quarantine except to seek medical care if  °symptoms worsen, and asked to  °Stay home and avoid contact with others until you get your results (4-5 days)  °Avoid travel on public transportation if possible (such as bus, train, or airplane) or °Sent to the Emergency Department by EMS for evaluation, COVID-19 testing  and  °possible admission depending on your condition and test results. ° °What to do if you are LOW RISK for COVID-19? ° °Reduce your risk of any infection by using the same precautions used for avoiding the common cold or flu:  °Wash your hands often with soap and warm water for at least 20 seconds.  If soap and water are not readily available,  °use an alcohol-based hand sanitizer with at least 60% alcohol.  °If coughing or sneezing, cover your mouth and nose by coughing   or sneezing into the elbow areas of your shirt or coat, ° into a tissue or into your sleeve (not your hands). °Avoid shaking hands with others and consider head nods or verbal greetings only. °Avoid touching your eyes, nose, or mouth with unwashed hands.  °Avoid close contact with people who are sick. °Avoid places or events with large numbers of people in one location, like concerts or sporting events. °Carefully consider travel plans you have or are making. °If you are planning any travel outside or inside the [place], visit the CDC’s Travelers’ Health  webpage for the latest health notices. °If you have some symptoms but not all symptoms, continue to monitor at home and seek medical attention  °if your symptoms worsen. °If you are having a medical emergency, call 911. °>>>>>>>>>>>>>>>>>>>>>>>>>>>> °Preventive Care for Adults ° °A healthy lifestyle and preventive care can promote health and wellness. Preventive health guidelines for men include the following key practices: °A routine yearly physical is a good way to check with your health care provider about your health and preventative screening. It is a chance to share any concerns and updates on your health and to receive a thorough exam. °Visit your dentist for a routine exam and preventative care every 6 months. Brush your teeth twice a day and floss once a day. Good oral hygiene prevents tooth decay and gum disease. °The frequency of eye exams is based on your age, health, family medical history, use of contact lenses, and other factors. Follow your health care provider's recommendations for frequency of eye exams. °Eat a healthy diet. Foods such as vegetables, fruits, whole grains, low-fat dairy products, and lean protein foods contain the nutrients you need without too many calories. Decrease your intake of foods high in solid fats, added sugars, and salt. Eat the right amount of calories for you. Get information about a proper diet from your health care provider, if necessary. °Regular physical exercise is one of the most important things you can do for your health. Most adults should get at least 150 minutes of moderate-intensity exercise (any activity that increases your heart rate and causes you to sweat) each week. In addition, most adults need muscle-strengthening exercises on 2 or more days a week. °Maintain a healthy weight. The body mass index (BMI) is a screening tool to identify possible weight problems. It provides an estimate of body fat based on height and weight. Your health care provider can  find your BMI and can help you achieve or maintain a healthy weight. For adults 20 years and older: °A BMI below 18.5 is considered underweight. °A BMI of 18.5 to 24.9 is normal. °A BMI of 25 to 29.9 is considered overweight. °A BMI of 30 and above is considered obese. °Maintain normal blood lipids and cholesterol levels by exercising and minimizing your intake of saturated fat. Eat a balanced diet with plenty of fruit and vegetables. Blood tests for lipids and cholesterol should begin at age 20 and be repeated every 5 years. If your lipid or cholesterol levels are high, you are over 50, or you are at high risk for heart disease, you may need your cholesterol levels checked more frequently. Ongoing high lipid and cholesterol levels should be treated with medicines if diet and exercise are not working. °If you smoke, find out from your health care provider how to quit. If you do not use tobacco, do not start. °Lung cancer screening is recommended for adults aged 55-80 years who are at high risk for   developing lung cancer because of a history of smoking. A yearly low-dose CT scan of the lungs is recommended for people who have at least a 30-pack-year history of smoking and are a current smoker or have quit within the past 15 years. A pack year of smoking is smoking an average of 1 pack of cigarettes a day for 1 year (for example: 1 pack a day for 30 years or 2 packs a day for 15 years). Yearly screening should continue until the smoker has stopped smoking for at least 15 years. Yearly screening should be stopped for people who develop a health problem that would prevent them from having lung cancer treatment. °If you choose to drink alcohol, do not have more than 2 drinks per day. One drink is considered to be 12 ounces (355 mL) of beer, 5 ounces (148 mL) of wine, or 1.5 ounces (44 mL) of liquor. °Avoid use of street drugs. Do not share needles with anyone. Ask for help if you need support or instructions about  stopping the use of drugs. °High blood pressure causes heart disease and increases the risk of stroke. Your blood pressure should be checked at least every 1-2 years. Ongoing high blood pressure should be treated with medicines, if weight loss and exercise are not effective. °If you are 45-79 years old, ask your health care provider if you should take aspirin to prevent heart disease. °Diabetes screening involves taking a blood sample to check your fasting blood sugar level. This should be done once every 3 years, after age 45, if you are within normal weight and without risk factors for diabetes. Testing should be considered at a younger age or be carried out more frequently if you are overweight and have at least 1 risk factor for diabetes. °Colorectal cancer can be detected and often prevented. Most routine colorectal cancer screening begins at the age of 50 and continues through age 75. However, your health care provider may recommend screening at an earlier age if you have risk factors for colon cancer. On a yearly basis, your health care provider may provide home test kits to check for hidden blood in the stool. Use of a small camera at the end of a tube to directly examine the colon (sigmoidoscopy or colonoscopy) can detect the earliest forms of colorectal cancer. Talk to your health care provider about this at age 50, when routine screening begins. Direct exam of the colon should be repeated every 5-10 years through age 75, unless early forms of precancerous polyps or small growths are found. ° Talk with your health care provider about prostate cancer screening. °Testicular cancer screening isrecommended for adult males. Screening includes self-exam, a health care provider exam, and other screening tests. Consult with your health care provider about any symptoms you have or any concerns you have about testicular cancer. °Use sunscreen. Apply sunscreen liberally and repeatedly throughout the day. You should  seek shade when your shadow is shorter than you. Protect yourself by wearing long sleeves, pants, a wide-brimmed hat, and sunglasses year round, whenever you are outdoors. °Once a month, do a whole-body skin exam, using a mirror to look at the skin on your back. Tell your health care provider about new moles, moles that have irregular borders, moles that are larger than a pencil eraser, or moles that have changed in shape or color. °Stay current with required vaccines (immunizations). °Influenza vaccine. All adults should be immunized every year. °Tetanus, diphtheria, and acellular pertussis (Td, Tdap) vaccine. An   adult who has not previously received Tdap or who does not know his vaccine status should receive 1 dose of Tdap. This initial dose should be followed by tetanus and diphtheria toxoids (Td) booster doses every 10 years. Adults with an unknown or incomplete history of completing a 3-dose immunization series with Td-containing vaccines should begin or complete a primary immunization series including a Tdap dose. Adults should receive a Td booster every 10 years. °Varicella vaccine. An adult without evidence of immunity to varicella should receive 2 doses or a second dose if he has previously received 1 dose. °Human papillomavirus (HPV) vaccine. Males aged 13-21 years who have not received the vaccine previously should receive the 3-dose series. Males aged 22-26 years may be immunized. Immunization is recommended through the age of 26 years for any male who has sex with males and did not get any or all doses earlier. Immunization is recommended for any person with an immunocompromised condition through the age of 26 years if he did not get any or all doses earlier. During the 3-dose series, the second dose should be obtained 4-8 weeks after the first dose. The third dose should be obtained 24 weeks after the first dose and 16 weeks after the second dose. °Zoster vaccine. One dose is recommended for adults  aged 60 years or older unless certain conditions are present. ° °PREVNAR  - Pneumococcal 13-valent conjugate (PCV13) vaccine. When indicated, a person who is uncertain of his immunization history and has no record of immunization should receive the PCV13 vaccine. An adult aged 19 years or older who has certain medical conditions and has not been previously immunized should receive 1 dose of PCV13 vaccine. This PCV13 should be followed with a dose of pneumococcal polysaccharide (PPSV23) vaccine. The PPSV23 vaccine dose should be obtained at least 1 r more year(s) after the dose of PCV13 vaccine. An adult aged 19 years or older who has certain medical conditions and previously received 1 or more doses of PPSV23 vaccine should receive 1 dose of PCV13. The PCV13 vaccine dose should be obtained 1 or more years after the last PPSV23 vaccine dose. ° °PNEUMOVAX - Pneumococcal polysaccharide (PPSV23) vaccine. When PCV13 is also indicated, PCV13 should be obtained first. All adults aged 65 years and older should be immunized. An adult younger than age 65 years who has certain medical conditions should be immunized. Any person who resides in a nursing home or long-term care facility should be immunized. An adult smoker should be immunized. People with an immunocompromised condition and certain other conditions should receive both PCV13 and PPSV23 vaccines. People with human immunodeficiency virus (HIV) infection should be immunized as soon as possible after diagnosis. Immunization during chemotherapy or radiation therapy should be avoided. Routine use of PPSV23 vaccine is not recommended for American Indians, [place] Natives, or people younger than 65 years unless there are medical conditions that require PPSV23 vaccine. When indicated, people who have unknown immunization and have no record of immunization should receive PPSV23 vaccine. One-time revaccination 5 years after the first dose of PPSV23 is recommended for people  aged 19-64 years who have chronic kidney failure, nephrotic syndrome, asplenia, or immunocompromised conditions. People who received 1-2 doses of PPSV23 before age 65 years should receive another dose of PPSV23 vaccine at age 65 years or later if at least 5 years have passed since the previous dose. Doses of PPSV23 are not needed for people immunized with PPSV23 at or after age 65 years. ° °Hepatitis A vaccine.   Adults who wish to be protected from this disease, have certain high-risk conditions, work with hepatitis A-infected animals, work in hepatitis A research labs, or travel to or work in countries with a high rate of hepatitis A should be immunized. Adults who were previously unvaccinated and who anticipate close contact with an international adoptee during the first 60 days after arrival in the [place] from a country with a high rate of hepatitis A should be immunized. ° °Hepatitis B vaccine. Adults should be immunized if they wish to be protected from this disease, have certain high-risk conditions, may be exposed to blood or other infectious body fluids, are household contacts or sex partners of hepatitis B positive people, are clients or workers in certain care facilities, or travel to or work in countries with a high rate of hepatitis B. ° °Preventive Service / Frequency ° °Ages 40 to 64 °Blood pressure check. °Lipid and cholesterol check °Lung cancer screening. / Every year if you are aged 55-80 years and have a 30-pack-year history of smoking and currently smoke or have quit within the past 15 years. Yearly screening is stopped once you have quit smoking for at least 15 years or develop a health problem that would prevent you from having lung cancer treatment. °Fecal occult blood test (FOBT) of stool. / Every year beginning at age 50 and continuing until age 75. You may not have to do this test if you get a colonoscopy every 10 years. °Flexible sigmoidoscopy** or colonoscopy.** / Every 5 years for  a flexible sigmoidoscopy or every 10 years for a colonoscopy beginning at age 50 and continuing until age 75. °Screening for abdominal aortic aneurysm (AAA)  by ultrasound is recommended for people who have history of high blood pressure or who are current or former smokers. °+++++++++++ °Recommend Adult Low Dose Aspirin or  °coated  Aspirin 81 mg daily  °To reduce risk of Colon Cancer 40 %, ° Skin Cancer 26 % ,  °Malignant Melanoma 46% ° and  °Pancreatic cancer 60% °++++++++++++++++++++ °Vitamin D goal ° is between 70-100.  °Please make sure that you are taking your Vitamin D as directed.  °It is very important as a natural anti-inflammatory  °helping hair, skin, and nails, as well as reducing stroke and heart attack risk.  °It helps your bones and helps with mood. °It also decreases numerous cancer risks so please take it as directed.  °Low Vit D is associated with a 200-300% higher risk for CANCER  °and 200-300% higher risk for HEART   ATTACK  &  STROKE.   °...................................... °It is also associated with higher death rate at younger ages,  °autoimmune diseases like Rheumatoid arthritis, Lupus, Multiple Sclerosis.    °Also many other serious conditions, like depression, Alzheimer's °Dementia, infertility, muscle aches, fatigue, fibromyalgia - just to name a few. °+++++++++++++++++++++ °Recommend the book "The END of DIETING" by Dr Joel Fuhrman  °& the book "The END of DIABETES " by Dr Joel Fuhrman °At [employer] - get book & Audio CD's  °  Being diabetic has a  300% increased risk for heart attack, stroke, cancer, and alzheimer- type vascular dementia. It is very important that you work harder with diet by avoiding all foods that are white. Avoid white rice (brown & wild rice is OK), white potatoes (sweetpotatoes in moderation is OK), White bread or wheat bread or anything made out of white flour like bagels, donuts, rolls, buns, biscuits, cakes, pastries, cookies, pizza crust, and pasta (made    from white flour & egg whites) - vegetarian pasta or spinach or wheat pasta is OK. Multigrain breads like Arnold's or [employer], or multigrain sandwich thins or flatbreads.  Diet, exercise and weight loss can reverse and cure diabetes in the early stages.  Diet, exercise and weight loss is very important in the control and prevention of complications of diabetes which affects every system in your body, ie. Brain - dementia/stroke, eyes - glaucoma/blindness, heart - heart attack/heart failure, kidneys - dialysis, stomach - gastric paralysis, intestines - malabsorption, nerves - severe painful neuritis, circulation - gangrene & loss of a leg(s), and finally cancer and Alzheimers. ° °  I recommend avoid fried & greasy foods,  sweets/candy, white rice (brown or wild rice or Quinoa is OK), white potatoes (sweet potatoes are OK) - anything made from white flour - bagels, doughnuts, rolls, buns, biscuits,white and wheat breads, pizza crust and traditional pasta made of white flour & egg white(vegetarian pasta or spinach or wheat pasta is OK).  Multi-grain bread is OK - like multi-grain flat bread or sandwich thins. Avoid alcohol in excess. Exercise is also important. ° °  Eat all the vegetables you want - avoid meat, especially red meat and dairy - especially cheese.  Cheese is the most concentrated form of trans-fats which is the worst thing to clog up our arteries. Veggie cheese is OK which can be found in the fresh produce section at [employer] or [employer] or [employer] ° °++++++++++++++++++++++ °DASH Eating Plan ° °DASH stands for "Dietary Approaches to Stop Hypertension."  ° °The DASH eating plan is a healthy eating plan that has been shown to reduce high blood pressure (hypertension). Additional health benefits may include reducing the risk of type 2 diabetes mellitus, heart disease, and stroke. The DASH eating plan may also help with weight loss. °WHAT DO I NEED TO KNOW ABOUT THE DASH EATING PLAN? °For  the DASH eating plan, you will follow these general guidelines: °Choose foods with a percent daily value for sodium of less than 5% (as listed on the food label). °Use salt-free seasonings or herbs instead of table salt or sea salt. °Check with your health care provider or pharmacist before using salt substitutes. °Eat lower-sodium products, often labeled as "lower sodium" or "no salt added." °Eat fresh foods. °Eat more vegetables, fruits, and low-fat dairy products. °Choose whole grains. Look for the word "whole" as the first word in the ingredient list. °Choose fish  °Limit sweets, desserts, sugars, and sugary drinks. °Choose heart-healthy fats. °Eat veggie cheese  °Eat more home-cooked food and less restaurant, buffet, and fast food. °Limit fried foods. °Cook foods using methods other than frying. °Limit canned vegetables. If you do use them, rinse them well to decrease the sodium. °When eating at a restaurant, ask that your food be prepared with less salt, or no salt if possible. °                  °   WHAT FOODS CAN I EAT? °Read Dr Joel Fuhrman's books on The End of Dieting & The End of Diabetes ° °Grains °Whole grain or whole wheat bread. Brown rice. Whole grain or whole wheat pasta. Quinoa, bulgur, and whole grain cereals. Low-sodium cereals. Corn or whole wheat flour tortillas. Whole grain cornbread. Whole grain crackers. Low-sodium crackers. ° °Vegetables °Fresh or frozen vegetables (raw, steamed, roasted, or grilled). Low-sodium or reduced-sodium tomato and vegetable juices. Low-sodium or reduced-sodium tomato sauce and paste. Low-sodium or reduced-sodium canned vegetables.  ° °  Fruits °All fresh, canned (in natural juice), or frozen fruits. ° °Protein Products ° All fish and seafood.  Dried beans, peas, or lentils. Unsalted nuts and seeds. Unsalted canned beans. ° °Dairy °Low-fat dairy products, such as skim or 1% milk, 2% or reduced-fat cheeses, low-fat ricotta or cottage cheese, or plain low-fat yogurt.  Low-sodium or reduced-sodium cheeses. ° °Fats and Oils °Tub margarines without trans fats. Light or reduced-fat mayonnaise and salad dressings (reduced sodium). Avocado. Safflower, olive, or canola oils. Natural peanut or almond butter. ° °Other °Unsalted popcorn and pretzels. °The items listed above may not be a complete list of recommended foods or beverages. Contact your dietitian for more options. ° °+++++++++++++++++++ ° °WHAT FOODS ARE NOT RECOMMENDED? °Grains/ White flour or wheat flour °White bread. White pasta. White rice. Refined cornbread. Bagels and croissants. Crackers that contain trans fat. ° °Vegetables ° °Creamed or fried vegetables. Vegetables in a . Regular canned vegetables. Regular canned tomato sauce and paste. Regular tomato and vegetable juices. ° °Fruits °Dried fruits. Canned fruit in light or heavy syrup. Fruit juice. ° °Meat and Other Protein Products °Meat in general - RED meat & White meat.  Fatty cuts of meat. Ribs, chicken wings, all processed meats as bacon, sausage, bologna, salami, fatback, hot dogs, bratwurst and packaged luncheon meats. ° °Dairy °Whole or 2% milk, cream, half-and-half, and cream cheese. Whole-fat or sweetened yogurt. Full-fat cheeses or blue cheese. Non-dairy creamers and whipped toppings. Processed cheese, cheese spreads, or cheese curds. ° °Condiments °Onion and garlic salt, seasoned salt, table salt, and sea salt. Canned and packaged gravies. Worcestershire sauce. Tartar sauce. Barbecue sauce. Teriyaki sauce. Soy sauce, including reduced sodium. Steak sauce. Fish sauce. Oyster sauce. Cocktail sauce. Horseradish. Ketchup and mustard. Meat flavorings and tenderizers. Bouillon cubes. Hot sauce. Tabasco sauce. Marinades. Taco seasonings. Relishes. ° °Fats and Oils °Butter, stick margarine, lard, shortening and bacon fat. Coconut, palm kernel, or palm oils. Regular salad dressings. ° °Pickles and olives. Salted popcorn and pretzels. ° °The items listed above may not  be a complete list of foods and beverages to avoid. ° ° °

## 2022-02-19 LAB — COMPLETE METABOLIC PANEL WITH GFR
AG Ratio: 2.2 (calc) (ref 1.0–2.5)
ALT: 17 U/L (ref 9–46)
AST: 17 U/L (ref 10–35)
Albumin: 4.4 g/dL (ref 3.6–5.1)
Alkaline phosphatase (APISO): 61 U/L (ref 35–144)
BUN: 17 mg/dL (ref 7–25)
CO2: 32 mmol/L (ref 20–32)
Calcium: 9.9 mg/dL (ref 8.6–10.3)
Chloride: 101 mmol/L (ref 98–110)
Creat: 0.98 mg/dL (ref 0.70–1.35)
Globulin: 2 g/dL (calc) (ref 1.9–3.7)
Glucose, Bld: 88 mg/dL (ref 65–99)
Potassium: 4.6 mmol/L (ref 3.5–5.3)
Sodium: 143 mmol/L (ref 135–146)
Total Bilirubin: 1.1 mg/dL (ref 0.2–1.2)
Total Protein: 6.4 g/dL (ref 6.1–8.1)
eGFR: 88 mL/min/{1.73_m2} (ref >=60)

## 2022-02-19 LAB — URINALYSIS, ROUTINE W REFLEX MICROSCOPIC
Bacteria, UA: NONE SEEN /HPF
Bilirubin Urine: NEGATIVE
Glucose, UA: NEGATIVE
Hgb urine dipstick: NEGATIVE
Ketones, ur: NEGATIVE
Leukocytes,Ua: NEGATIVE
Nitrite: NEGATIVE
RBC / HPF: NONE SEEN /HPF (ref 0–2)
Specific Gravity, Urine: 1.029 (ref 1.001–1.035)
WBC, UA: NONE SEEN /HPF (ref 0–5)
pH: 7 (ref 5.0–8.0)

## 2022-02-19 LAB — MICROSCOPIC MESSAGE

## 2022-02-19 LAB — IRON, TOTAL/TOTAL IRON BINDING CAP
%SAT: 34 % (calc) (ref 20–48)
Iron: 121 ug/dL (ref 50–180)
TIBC: 361 mcg/dL (calc) (ref 250–425)

## 2022-02-19 LAB — MICROALBUMIN / CREATININE URINE RATIO
Creatinine, Urine: 247 mg/dL (ref 20–320)
Microalb Creat Ratio: 30 mcg/mg creat — ABNORMAL HIGH (ref <30)
Microalb, Ur: 7.3 mg/dL

## 2022-02-19 LAB — TSH: TSH: 1.46 mIU/L (ref 0.40–4.50)

## 2022-02-19 LAB — CBC WITH DIFFERENTIAL/PLATELET
Absolute Monocytes: 861 cells/uL (ref 200–950)
Basophils Absolute: 63 cells/uL (ref 0–200)
Basophils Relative: 0.8 %
Eosinophils Absolute: 213 cells/uL (ref 15–500)
Eosinophils Relative: 2.7 %
HCT: 46.3 % (ref 38.5–50.0)
Hemoglobin: 15.6 g/dL (ref 13.2–17.1)
Lymphs Abs: 2457 cells/uL (ref 850–3900)
MCH: 29.2 pg (ref 27.0–33.0)
MCHC: 33.7 g/dL (ref 32.0–36.0)
MCV: 86.7 fL (ref 80.0–100.0)
MPV: 11.3 fL (ref 7.5–12.5)
Monocytes Relative: 10.9 %
Neutro Abs: 4306 cells/uL (ref 1500–7800)
Neutrophils Relative %: 54.5 %
Platelets: 285 10*3/uL (ref 140–400)
RBC: 5.34 10*6/uL (ref 4.20–5.80)
RDW: 13.1 % (ref 11.0–15.0)
Total Lymphocyte: 31.1 %
WBC: 7.9 10*3/uL (ref 3.8–10.8)

## 2022-02-19 LAB — HEMOGLOBIN A1C
Hgb A1c MFr Bld: 5.3 % of total Hgb (ref <5.7)
Mean Plasma Glucose: 105 mg/dL
eAG (mmol/L): 5.8 mmol/L

## 2022-02-19 LAB — LIPID PANEL
Cholesterol: 112 mg/dL (ref <200)
HDL: 31 mg/dL — ABNORMAL LOW (ref >=40)
LDL Cholesterol (Calc): 47 mg/dL (calc)
Non-HDL Cholesterol (Calc): 81 mg/dL (calc) (ref <130)
Total CHOL/HDL Ratio: 3.6 (calc) (ref <5.0)
Triglycerides: 325 mg/dL — ABNORMAL HIGH (ref <150)

## 2022-02-19 LAB — INSULIN, RANDOM: Insulin: 24.2 u[IU]/mL — ABNORMAL HIGH

## 2022-02-19 LAB — VITAMIN D 25 HYDROXY (VIT D DEFICIENCY, FRACTURES): Vit D, 25-Hydroxy: 127 ng/mL — ABNORMAL HIGH (ref 30–100)

## 2022-02-19 LAB — VITAMIN B12: Vitamin B-12: 1436 pg/mL — ABNORMAL HIGH (ref 200–1100)

## 2022-02-19 LAB — MAGNESIUM: Magnesium: 2.1 mg/dL (ref 1.5–2.5)

## 2022-02-19 LAB — PSA: PSA: 0.4 ng/mL (ref <=4.00)

## 2022-02-19 NOTE — Progress Notes (Signed)
<><><><><><><><><><><><><><><><><><><><><><><><><><><><><><><><><> <><><><><><><><><><><><><><><><><><><><><><><><><><><><><><><><><> -   Test results slightly outside the reference range are not unusual. If there is anything important, I will review this with you,  otherwise it is considered normal test values.  If you have further questions,  please do not hesitate to contact me at the office or via My Chart.  <><><><><><><><><><><><><><><><><><><><><><><><><><><><><><><><><> <><><><><><><><><><><><><><><><><><><><><><><><><><><><><><><><><>  -  Total Chol = 112     &   LDL Chol = 47   - Both  Excellent   - Very low risk for Heart Attack  / Stroke <><><><><><><><><><><><><><><><><><><><><><><><><><><><><><><><><>  -   But Triglycerides (   325   ) or fats in blood are too high                 (   Ideal or  Goal is less than 150  !  )    - Recommend avoid fried & greasy foods,  sweets / candy,   - Avoid white rice  (brown or wild rice or Quinoa is OK),   - Avoid white potatoes  (sweet potatoes are OK)   - Avoid anything made from white flour  - bagels, doughnuts, rolls, buns, biscuits, white and   wheat breads, pizza crust and traditional  pasta made of white flour & egg white  - (vegetarian pasta or spinach or wheat pasta is OK).    - Multi-grain bread is OK - like multi-grain flat bread or  sandwich thins.   - Avoid alcohol in excess.   - Exercise is also important. <><><><><><><><><><><><><><><><><><><><><><><><><><><><><><><><><>  -  Vitamin D = 127 is too high   -  Recommend stop for 1 week ,  then can restart &   - only take 10,000 units one time /day  (Not twice /day )  <><><><><><><><><><><><><><><><><><><><><><><><><><><><><><><><><>  -  PSA - very Low  - Great ! <><><><><><><><><><><><><><><><><><><><><><><><><><><><><><><><><>  -  A1c= 5.3% - Not Diabetic - Great ! <><><><><><><><><><><><><><><><><><><><><><><><><><><><><><><><><>  -  All Else - CBC -  Kidneys - Electrolytes - Liver - Magnesium & Thyroid    - all  Normal / OK <><><><><><><><><><><><><><><><><><><><><><><><><><><><><><><><><> <><><><><><><><><><><><><><><><><><><><><><><><><><><><><><><><><>

## 2022-02-21 ENCOUNTER — Encounter: Payer: Self-pay | Admitting: Emergency Medicine

## 2022-02-24 ENCOUNTER — Encounter: Payer: Self-pay | Admitting: Physical Therapy

## 2022-02-24 ENCOUNTER — Ambulatory Visit: Payer: BC Managed Care – PPO | Admitting: Physical Therapy

## 2022-02-24 ENCOUNTER — Ambulatory Visit (INDEPENDENT_AMBULATORY_CARE_PROVIDER_SITE_OTHER): Payer: BC Managed Care – PPO | Admitting: Orthopaedic Surgery

## 2022-02-24 ENCOUNTER — Encounter: Payer: Self-pay | Admitting: Orthopaedic Surgery

## 2022-02-24 DIAGNOSIS — M1712 Unilateral primary osteoarthritis, left knee: Secondary | ICD-10-CM | POA: Insufficient documentation

## 2022-02-24 DIAGNOSIS — R262 Difficulty in walking, not elsewhere classified: Secondary | ICD-10-CM

## 2022-02-24 DIAGNOSIS — M5459 Other low back pain: Secondary | ICD-10-CM | POA: Diagnosis not present

## 2022-02-24 DIAGNOSIS — M170 Bilateral primary osteoarthritis of knee: Secondary | ICD-10-CM | POA: Diagnosis not present

## 2022-02-24 DIAGNOSIS — M1711 Unilateral primary osteoarthritis, right knee: Secondary | ICD-10-CM

## 2022-02-24 DIAGNOSIS — M6281 Muscle weakness (generalized): Secondary | ICD-10-CM | POA: Diagnosis not present

## 2022-02-24 MED ORDER — HYLAN G-F 20 48 MG/6ML IX SOSY
48.0000 mg | PREFILLED_SYRINGE | INTRA_ARTICULAR | Status: AC | PRN
  Administered 2022-02-24: 48 mg via INTRA_ARTICULAR

## 2022-02-24 NOTE — Progress Notes (Signed)
   Procedure Note  Patient: Mark Harrell.             Date of Birth: 09-26-1960           MRN: [id number]             Visit Date: 02/24/2022  Procedures: Visit Diagnoses:  1. Unilateral primary osteoarthritis, left knee   2. Unilateral primary osteoarthritis, right knee     Large Joint Inj: R knee on 02/24/2022 4:06 PM Indications: pain and diagnostic evaluation Details: 22 G 1.5 in needle, superolateral approach  Arthrogram: No  Medications: 48 mg Hylan 48 MG/6ML Outcome: tolerated well, no immediate complications Procedure, treatment alternatives, risks and benefits explained, specific risks discussed. Consent was given by the patient. Immediately prior to procedure a time out was called to verify the correct patient, procedure, equipment, support staff and site/side marked as required. Patient was prepped and draped in the usual sterile fashion.    Large Joint Inj: L knee on 02/24/2022 4:06 PM Indications: pain and diagnostic evaluation Details: 22 G 1.5 in needle, superolateral approach  Arthrogram: No  Medications: 48 mg Hylan 48 MG/6ML Outcome: tolerated well, no immediate complications Procedure, treatment alternatives, risks and benefits explained, specific risks discussed. Consent was given by the patient. Immediately prior to procedure a time out was called to verify the correct patient, procedure, equipment, support staff and site/side marked as required. Patient was prepped and draped in the usual sterile fashion.    The patient is here today for scheduled hyaluronic acid injections with Synvisc 1 in both knees to treat the pain from osteoarthritis.  He has tried and failed other conservative treatment measures.  His BMI is 47.63 the last time was measured.  He is asking appropriate about [facility] as an outpatient facility for rehabbing his back and knees.  I think this is appropriate for him to look into and let us know if we can order therapy for this place  I did  place Synvisc 1 in both knees which she tolerated well.  He knows that he is injections can take 4 to 6 weeks before they help the knee feel better.  The earliest they can be placed again in 6 months if insurance will cover it.  Lot# [id number]

## 2022-02-24 NOTE — Telephone Encounter (Signed)
Mychart message sent by pt: Mark Harrell.  P [facility] (supporting Collene Gobble, MD) 3 days ago    Dr. Lamonte Sakai:  I still haven't heard anything about scheduling the scans.  Should I be concerned? Thanks, [name]    Routing to [facility] for review. Pt has an upcoming appt scheduled with Dr. Lamonte Sakai 10/19 so scans will either need to be before that appt or pt's appt will need to be rescheduled once we have the date of the imaging.

## 2022-02-24 NOTE — Telephone Encounter (Signed)
Dr Lamonte Sakai asked for this to be done mid to late October.  No need for concern.  I have given this to [facility] to be precerted with insurance and once authorized it will be sent to [facility] to schedule.  Will route to triage so they can make pt aware thru [place].

## 2022-02-24 NOTE — Therapy (Signed)
OUTPATIENT PHYSICAL THERAPY TREATMENT NOTE   Patient Name: Mark Harrell. MRN: [id number] DOB:10/11/1960, 61 y.o., male Today's Date: 02/24/2022  PCP: [name], MD REFERRING PROVIDER: Magnus Sinning, MD  END OF SESSION:   PT End of Session - 02/24/22 1516     Visit Number 2    Number of Visits 8    Date for PT Re-Evaluation 04/08/22    Authorization Type BCBS    Authorization - Visit Number 2    Authorization - Number of Visits 30    PT Start Time 1950    PT Stop Time 1515    PT Time Calculation (min) 43 min    Activity Tolerance Patient limited by pain    Behavior During Therapy WFL for tasks assessed/performed             Past Medical History:  Diagnosis Date   Anesthesia complication requiring reversal agent administration    ? from central apnea, very difficult to get off vent   Anxiety    Arthritis    osteo   Asthma    BPH (benign prostatic hyperplasia)    Complication of anesthesia    difficulty waking , they twlight me because of my respiratory problems "   Depression    Dyspnea    on exertion   Enlarged heart    Family history of adverse reaction to anesthesia    mother trouble waking up, and heart stopped   GERD (gastroesophageal reflux disease)    Headache    botox injections for headaches   Hyperlipidemia    Hypertension    Hypogonadism male    IBS (irritable bowel syndrome)    Memory difficulties    short term memories   Neuropathy    Obesity    On home oxygen therapy    on 2 liter   OSA (obstructive sleep apnea)    not using cpap   Paralysis ([place])    left hand - small   Pneumonia    Pre-diabetes    Prostatitis    Past Surgical History:  Procedure Laterality Date   ABDOMINAL SURGERY     ANKLE FRACTURE SURGERY Right    ANTERIOR CERVICAL DECOMP/DISCECTOMY FUSION N/A 05/23/2019   Procedure: ANTERIOR CERVICAL DISCECTOMY FUSION CERVICAL FIVE THROUGH CERVICAL SIX AND CERVICAL SIX THROUGH CERVICAL SEVEN;  Surgeon: Jessy Oto, MD;  Location: [place];  Service: Orthopedics;  Laterality: N/A;   COLONOSCOPY     CYSTOSCOPY     [place]   KNEE ARTHROSCOPY WITH MEDIAL MENISECTOMY Left 01/02/2017   Procedure: LEFT KNEE ARTHROSCOPY WITH PARTIAL MEDIAL MENISCECTOMY;  Surgeon: Mcarthur Rossetti, MD;  Location: [facility];  Service: Orthopedics;  Laterality: Left;   POSTERIOR CERVICAL FUSION/FORAMINOTOMY N/A 08/20/2020   Procedure: LEFT CERVICAL SIX THROUGH SEVEN  AND CERVICAL SEVEN THROUGH THORACIC ONE  FORAMINOTOMIES WITH EXCISION OF [place] HERNIATION LEFT CERVICAL SEVEN THROUGH THORACIC ONE;  Surgeon: Jessy Oto, MD;  Location: [place];  Service: Orthopedics;  Laterality: N/A;   TONSILLECTOMY     TURBINATE RESECTION  2007   UVULOPALATOPHARYNGOPLASTY     Patient Active Problem List   Diagnosis Date Noted   Unilateral primary osteoarthritis, left knee 02/24/2022   Unilateral primary osteoarthritis, right knee 02/24/2022   Abnormal CT of the chest 01/28/2022   Hypokalemia 09/26/2021   Dyspnea 09/25/2021   Acute deep vein thrombosis (DVT) of right lower extremity ([place]) 09/24/2021   OSA (obstructive sleep apnea) 09/24/2021   Herniation of cervical intervertebral  disc with radiculopathy    Status post cervical discectomy 08/20/2020   Tracheomalacia 05/26/2019   Acquired tracheomalacia 05/26/2019   HNP (herniated nucleus pulposus) with myelopathy, cervical 05/23/2019    Class: Chronic   Spinal stenosis of cervical region 05/23/2019    Class: Chronic   Status post cervical spinal fusion 05/23/2019   Chronic respiratory failure with hypoxia (HCC) 04/12/2018   Bilateral lower extremity edema 12/29/2017   Hymenoptera allergy 11/10/2017   Family history of colonic polyps 08/07/2017   Myofascial pain 08/06/2017   Seasonal and perennial allergic rhinitis 04/23/2017   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/2017   Cervicalgia 04/01/2017   Recurrent pneumonia 03/09/2017   Other spondylosis with radiculopathy, cervical region 02/09/2017    Depression with anxiety 01/04/2017   Memory difficulty 12/05/2016   Migraine without aura and without status migrainosus, not intractable 10/30/2016   Lumbar radiculopathy 05/15/2016   Osteoarthritis of spine with radiculopathy, lumbar region 05/15/2016   Risk for falls 05/15/2016   Recurrent infections 03/29/2016   Chronic nonseasonal allergic rhinitis due to pollen 03/29/2016   Polypharmacy 01/16/2016   Morbid obesity with BMI of 45.0-49.9, adult ([place]) 03/06/2015   SDAT 02/05/2015   OSA and COPD overlap syndrome ([place]) 02/05/2015   Medication management 08/02/2014   GERD (gastroesophageal reflux disease) 05/09/2014   Vitamin D deficiency 08/01/2013   Prediabetes 08/01/2013   Positive TB test 07/29/2011   Diverticula of colon 05/07/2011   Hypertension 01/31/2011   Hyperlipidemia, mixed 01/31/2011   BPH (benign prostatic hyperplasia) 01/31/2011   Testosterone Deficiency 01/31/2011   IBS (irritable bowel syndrome) 01/31/2011   Partial complex seizure disorder with intractable epilepsy ([place]) 01/31/2011   Depression, major, recurrent, in partial remission ([place]) 01/31/2011   Asthma-COPD overlap syndrome 01/31/2011    REFERRING DIAG:  M47.816 (ICD-10-CM) - Spondylosis without myelopathy or radiculopathy, lumbar region M54.50,G89.29 (ICD-10-CM) - Chronic bilateral low back pain without sciatica  THERAPY DIAG:  Other low back pain  Muscle weakness (generalized)  Difficulty in walking, not elsewhere classified  Rationale for Evaluation and Treatment Rehabilitation  PERTINENT HISTORY:   morbid obesity, HTN, pre-diabetes, COPD/ Asthma / OSA,knee OA,DVT,  PRECAUTIONS:   none  SUBJECTIVE: pt arriving today reporting 6/10 pain in his low back.   PAIN:  Are you having pain? 6/10 pain Pt stating pain can increase to 10/10 at times.     OBJECTIVE: (objective measures completed at initial evaluation unless otherwise dated)  OBJECTIVE:    DIAGNOSTIC FINDINGS: Lumbar MRI in  chart from 2020, I do not see any more recent imaging     PATIENT SURVEYS:  FOTO 34% functional   COGNITION:           Overall cognitive status: Within functional limits for tasks assessed                          SENSATION: WFL   MUSCLE LENGTH:     POSTURE: slumped posture   PALPATION: Tender to palpation    LUMBAR ROM:    Active  A/PROM  eval  Flexion 25%  Extension 10%  Right lateral flexion    Left lateral flexion    Right rotation 25%  Left rotation 25%   (Blank rows = not tested)   LOWER EXTREMITY MMT:      MMT in sittting Right eval Left eval  Hip flexion 3 3  Hip extension      Hip abduction      Hip adduction  Hip internal rotation      Hip external rotation      Knee flexion 3+ 3+  Knee extension 4- 4-  Ankle dorsiflexion      Ankle plantarflexion      Ankle inversion      Ankle eversion       (Blank rows = not tested)   LOWER EXTREMITY ROM:     AROM Right eval Left eval  Hip flexion      Hip extension      Hip abduction      Hip adduction      Hip internal rotation      Hip external rotation      Knee flexion      Knee extension      Ankle dorsiflexion      Ankle plantarflexion      Ankle inversion      Ankle eversion       (Blank rows = not tested)   LUMBAR SPECIAL TESTS:             Slump test: Positive   FUNCTIONAL TESTS:  02/11/22: Timed up and go (TUG): 29 sec with SPC   GAIT: Distance walked: limited Buyer, retail device utilized: Single point cane Level of assistance: Modified independence Comments: antalgic gait, slow velocity, wider BOS   TODAY'S TREATMENT:  02/24/22:  TherEx:  Nustep: level 4 x 10 minutes Sit to stand x 5 Supine: PPT x 15 holding 5 sec Supine; marching x 20  Supine heel slides x 10 each LE Modalities: Moist heat to lumbar spine x 5 minutes  Pt required increased time for all exercises and was limited by pain.       Eval HEP creation and review, see below for  details Cold pack X10 min   HOME EXERCISE PROGRAM: Access Code: [email or web address] URL: [email or web address] Date: 02/11/2022 Prepared by: Elsie Ra   Exercises - Slump Stretch  - 2 x daily - 6 x weekly - 1 sets - 10 reps - 3 hold - Seated Lumbar Flexion Stretch  - 2 x daily - 6 x weekly - 1 sets - 10 reps - 5 hold - Seated Quadratus Lumborum Stretch in Chair  - 2 x daily - 6 x weekly - 1 sets - 10 reps - 5 hold - Seated Straight Leg Raise with Quad Contraction  - 2 x daily - 6 x weekly - 1-2 sets - 10 reps - Standing Hip Abduction with Counter Support  - 2 x daily - 6 x weekly - 1 sets - 10 reps - Standing Lumbar Extension  - 2 x daily - 6 x weekly - 1 sets - 10 reps - 5 hold         PATIENT EDUCATION: Education details: HEP, PT plan of care Person educated: Patient Education method: Explanation, Demonstration, Verbal cues, and Handouts Education comprehension: verbalized understanding and needs further education     ASSESSMENT:   CLINICAL IMPRESSION: Pt arriving today reporting 6/10 pain in his low back. Pt requesting information on aquatic therapy to reduce stress on his joints especially his knees. Pt requiring increased time for all exercises. Pt's activities limited by pain in his low back. Continue skilled PT to maximize pt's function.    OBJECTIVE IMPAIRMENTS: decreased activity tolerance, difficulty walking, decreased balance, decreased endurance, decreased mobility, decreased ROM, decreased strength, impaired flexibility, impaired UE/LE use, postural dysfunction, and pain.   ACTIVITY LIMITATIONS: bending, lifting, carry, locomotion, cleaning, community activity, driving  PERSONAL FACTORS:  PMH includes:  morbid obesity, HTN, pre-diabetes, COPD/ Asthma / OSA,knee OA,DVT,are also affecting patient's functional outcome.   REHAB POTENTIAL: Fair     CLINICAL DECISION MAKING: Evolving/moderate complexity   EVALUATION COMPLEXITY: Moderate       GOALS: Short  term PT Goals Target date: 03/11/2022 Pt will be I and compliant with HEP. Baseline:  Goal status: on-going 02/24/22 Pt will decrease pain by 25% overall Baseline: Goal status: New   Long term PT goals Target date: 04/08/2022 Pt will improve lumbar ROM to at least 50% available to improve functional mobility Baseline: Goal status: New Pt will improve  hip/knee strength to at least 4+/5 MMT in sitting to improve functional strength Baseline: Goal status: New Pt will improve FOTO to at least 37% functional to show improved function Baseline: Goal status: New Pt will reduce pain by overall 50% overall with usual activity Baseline: Goal status: New Pt will reduce TUG score to less than 20 seconds to show improved gait speed and balance. Baseline: Goal status: New   PLAN: PT FREQUENCY: 1 times per week    PT DURATION: 4-8 weeks   PLANNED INTERVENTIONS (unless contraindicated): aquatic PT, Canalith repositioning, cryotherapy, Electrical stimulation, Iontophoresis with 4 mg/ml dexamethasome, Moist heat, traction, Ultrasound, gait training, Therapeutic exercise, balance training, neuromuscular re-education, patient/family education, prosthetic training, manual techniques, passive ROM, dry needling, taping, vasopnuematic device, vestibular, spinal manipulations, joint manipulations   PLAN FOR NEXT SESSION: needs lumbar mobility and general strength progressions as able.         Oretha Caprice, PT, MPT 02/24/2022, 4:35 PM

## 2022-02-25 ENCOUNTER — Other Ambulatory Visit: Payer: Self-pay | Admitting: Internal Medicine

## 2022-02-25 DIAGNOSIS — F0150 Vascular dementia without behavioral disturbance: Secondary | ICD-10-CM

## 2022-02-25 DIAGNOSIS — F3341 Major depressive disorder, recurrent, in partial remission: Secondary | ICD-10-CM

## 2022-02-25 MED ORDER — SERTRALINE HCL 100 MG PO TABS: ORAL_TABLET | ORAL | 3 refills | Status: DC

## 2022-02-25 MED ORDER — DONEPEZIL HCL 23 MG PO TABS: ORAL_TABLET | ORAL | 3 refills | Status: DC

## 2022-03-03 ENCOUNTER — Encounter: Payer: Self-pay | Admitting: Physical Therapy

## 2022-03-03 ENCOUNTER — Other Ambulatory Visit: Payer: Self-pay | Admitting: Physical Medicine and Rehabilitation

## 2022-03-03 ENCOUNTER — Ambulatory Visit (INDEPENDENT_AMBULATORY_CARE_PROVIDER_SITE_OTHER): Payer: BC Managed Care – PPO | Admitting: Physical Therapy

## 2022-03-03 ENCOUNTER — Telehealth: Payer: Self-pay | Admitting: Physical Medicine and Rehabilitation

## 2022-03-03 DIAGNOSIS — M47816 Spondylosis without myelopathy or radiculopathy, lumbar region: Secondary | ICD-10-CM

## 2022-03-03 DIAGNOSIS — R262 Difficulty in walking, not elsewhere classified: Secondary | ICD-10-CM

## 2022-03-03 DIAGNOSIS — G8929 Other chronic pain: Secondary | ICD-10-CM

## 2022-03-03 DIAGNOSIS — M6281 Muscle weakness (generalized): Secondary | ICD-10-CM

## 2022-03-03 DIAGNOSIS — M5459 Other low back pain: Secondary | ICD-10-CM | POA: Diagnosis not present

## 2022-03-03 DIAGNOSIS — M47819 Spondylosis without myelopathy or radiculopathy, site unspecified: Secondary | ICD-10-CM

## 2022-03-03 NOTE — Therapy (Signed)
OUTPATIENT PHYSICAL THERAPY TREATMENT NOTE/Discharge PHYSICAL THERAPY DISCHARGE SUMMARY  Visits from Start of Care: 3  Current functional level related to goals / functional outcomes: See below   Remaining deficits: See below   Education / Equipment: HEP, PT recommendation for referral back to MD and encouraged him to set up follow up appointment Plan:  Patient goals were not met. Patient is being discharged due to lack of progress with PT.       Patient Name: Mark Harrell. MRN: [id number] DOB:1960-10-22, 61 y.o., male Today's Date: 03/03/2022  PCP: Lucky Cowboy, MD REFERRING PROVIDER: Tyrell Antonio, MD  END OF SESSION:   PT End of Session - 03/03/22 1557     Visit Number 3    Number of Visits 8    Date for PT Re-Evaluation 04/08/22    Authorization Type BCBS    Authorization - Visit Number 3    Authorization - Number of Visits 30    PT Start Time 1557    PT Stop Time 1635    PT Time Calculation (min) 38 min    Activity Tolerance Patient limited by pain    Behavior During Therapy WFL for tasks assessed/performed             Past Medical History:  Diagnosis Date   Anesthesia complication requiring reversal agent administration    ? from central apnea, very difficult to get off vent   Anxiety    Arthritis    osteo   Asthma    BPH (benign prostatic hyperplasia)    Complication of anesthesia    difficulty waking , they twlight me because of my respiratory problems "   Depression    Dyspnea    on exertion   Enlarged heart    Family history of adverse reaction to anesthesia    mother trouble waking up, and heart stopped   GERD (gastroesophageal reflux disease)    Headache    botox injections for headaches   Hyperlipidemia    Hypertension    Hypogonadism male    IBS (irritable bowel syndrome)    Memory difficulties    short term memories   Neuropathy    Obesity    On home oxygen therapy    on 2 liter   OSA (obstructive sleep apnea)     not using cpap   Paralysis (HCC)    left hand - small   Pneumonia    Pre-diabetes    Prostatitis    Past Surgical History:  Procedure Laterality Date   ABDOMINAL SURGERY     ANKLE FRACTURE SURGERY Right    ANTERIOR CERVICAL DECOMP/DISCECTOMY FUSION N/A 05/23/2019   Procedure: ANTERIOR CERVICAL DISCECTOMY FUSION CERVICAL FIVE THROUGH CERVICAL SIX AND CERVICAL SIX THROUGH CERVICAL SEVEN;  Surgeon: Kerrin Champagne, MD;  Location: [facility];  Service: Orthopedics;  Laterality: N/A;   COLONOSCOPY     CYSTOSCOPY     [place]   KNEE ARTHROSCOPY WITH MEDIAL MENISECTOMY Left 01/02/2017   Procedure: LEFT KNEE ARTHROSCOPY WITH PARTIAL MEDIAL MENISCECTOMY;  Surgeon: Kathryne Hitch, MD;  Location: [facility];  Service: Orthopedics;  Laterality: Left;   POSTERIOR CERVICAL FUSION/FORAMINOTOMY N/A 08/20/2020   Procedure: LEFT CERVICAL SIX THROUGH SEVEN  AND CERVICAL SEVEN THROUGH THORACIC ONE  FORAMINOTOMIES WITH EXCISION OF [place] HERNIATION LEFT CERVICAL SEVEN THROUGH THORACIC ONE;  Surgeon: Kerrin Champagne, MD;  Location: [facility];  Service: Orthopedics;  Laterality: N/A;   TONSILLECTOMY     TURBINATE RESECTION  2007  UVULOPALATOPHARYNGOPLASTY     Patient Active Problem List   Diagnosis Date Noted   Unilateral primary osteoarthritis, left knee 02/24/2022   Unilateral primary osteoarthritis, right knee 02/24/2022   Abnormal CT of the chest 01/28/2022   Hypokalemia 09/26/2021   Dyspnea 09/25/2021   Acute deep vein thrombosis (DVT) of right lower extremity ([place]) 09/24/2021   OSA (obstructive sleep apnea) 09/24/2021   Herniation of cervical intervertebral disc with radiculopathy    Status post cervical discectomy 08/20/2020   Tracheomalacia 05/26/2019   Acquired tracheomalacia 05/26/2019   HNP (herniated nucleus pulposus) with myelopathy, cervical 05/23/2019    Class: Chronic   Spinal stenosis of cervical region 05/23/2019    Class: Chronic   Status post cervical spinal fusion 05/23/2019   Chronic  respiratory failure with hypoxia ([place]) 04/12/2018   Bilateral lower extremity edema 12/29/2017   Hymenoptera allergy 11/10/2017   Family history of colonic polyps 08/07/2017   Myofascial pain 08/06/2017   Seasonal and perennial allergic rhinitis 04/23/2017   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/2017   Cervicalgia 04/01/2017   Recurrent pneumonia 03/09/2017   Other spondylosis with radiculopathy, cervical region 02/09/2017   Depression with anxiety 01/04/2017   Memory difficulty 12/05/2016   Migraine without aura and without status migrainosus, not intractable 10/30/2016   Lumbar radiculopathy 05/15/2016   Osteoarthritis of spine with radiculopathy, lumbar region 05/15/2016   Risk for falls 05/15/2016   Recurrent infections 03/29/2016   Chronic nonseasonal allergic rhinitis due to pollen 03/29/2016   Polypharmacy 01/16/2016   Morbid obesity with BMI of 45.0-49.9, adult ([place]) 03/06/2015   SDAT 02/05/2015   OSA and COPD overlap syndrome ([place]) 02/05/2015   Medication management 08/02/2014   GERD (gastroesophageal reflux disease) 05/09/2014   Vitamin D deficiency 08/01/2013   Prediabetes 08/01/2013   Positive TB test 07/29/2011   Diverticula of colon 05/07/2011   Hypertension 01/31/2011   Hyperlipidemia, mixed 01/31/2011   BPH (benign prostatic hyperplasia) 01/31/2011   Testosterone Deficiency 01/31/2011   IBS (irritable bowel syndrome) 01/31/2011   Partial complex seizure disorder with intractable epilepsy ([place]) 01/31/2011   Depression, major, recurrent, in partial remission ([place]) 01/31/2011   Asthma-COPD overlap syndrome 01/31/2011    REFERRING DIAG:  M47.816 (ICD-10-CM) - Spondylosis without myelopathy or radiculopathy, lumbar region M54.50,G89.29 (ICD-10-CM) - Chronic bilateral low back pain without sciatica  THERAPY DIAG:  Other low back pain  Muscle weakness (generalized)  Difficulty in walking, not elsewhere classified  Rationale for Evaluation and Treatment  Rehabilitation  PERTINENT HISTORY:   morbid obesity, HTN, pre-diabetes, COPD/ Asthma / OSA,knee OA,DVT,  PRECAUTIONS:   none  SUBJECTIVE: pt arriving today reporting 6/10 pain in his low back.   PAIN:  Are you having pain? 6/10 pain Pt stating pain can increase to 10/10 at times.     OBJECTIVE: (objective measures completed at initial evaluation unless otherwise dated)  OBJECTIVE:    DIAGNOSTIC FINDINGS: Lumbar MRI in chart from 2020, I do not see any more recent imaging     PATIENT SURVEYS:  Eval: FOTO 34% functional 03/03/22: FOTO stayed the same at 34% functional    COGNITION:           Overall cognitive status: Within functional limits for tasks assessed                          SENSATION: WFL   MUSCLE LENGTH:     POSTURE: slumped posture   PALPATION: Tender to palpation    LUMBAR ROM:  Active A/PROM  eval AROM 03/03/22  Flexion 25% 25%  Extension 10% 10%  Right lateral flexion     Left lateral flexion     Right rotation 25% 25%  Left rotation 25% 25%   (Blank rows = not tested)   LOWER EXTREMITY MMT:      MMT in sittting Right eval Left eval Bilat 03/03/22  Hip flexion $RemoveBef'3 3 3  '[id number]$ Hip extension       Hip abduction       Hip adduction       Hip internal rotation       Hip external rotation       Knee flexion 3+ 3+ 3+  Knee extension 4- 4- 4-  Ankle dorsiflexion       Ankle plantarflexion       Ankle inversion       Ankle eversion        (Blank rows = not tested)   LOWER EXTREMITY ROM:     AROM Right eval Left eval  Hip flexion      Hip extension      Hip abduction      Hip adduction      Hip internal rotation      Hip external rotation      Knee flexion      Knee extension      Ankle dorsiflexion      Ankle plantarflexion      Ankle inversion      Ankle eversion       (Blank rows = not tested)   LUMBAR SPECIAL TESTS:             Slump test: Positive   FUNCTIONAL TESTS:  02/11/22: Timed up and go (TUG): 29 sec with  [facility] 03/03/22: Timed up and go 36 sec with SPC   GAIT: Distance walked: limited Buyer, retail device utilized: Single point cane Level of assistance: Modified independence Comments: antalgic gait, slow velocity, wider BOS   TODAY'S TREATMENT:  03/03/22:  TherEx:  Timed up and go with SPC 36 seconds with SPC Seated lumbar flexion stretch 5 sec X10 Seated lumbar rotation stretch 5 sec X 5 bilat  Moist heat to lumbar spine x 8 minutes with FOTO intake and subjective statements gathered, education on PT plan of care to discharge and refer back to MD  Pt required increased time for all exercises and was limited by pain.       Eval HEP creation and review, see below for details Cold pack X10 min   HOME EXERCISE PROGRAM: Access Code: [email or web address] URL: [email or web address] Date: 02/11/2022 Prepared by: Elsie Ra   Exercises - Slump Stretch  - 2 x daily - 6 x weekly - 1 sets - 10 reps - 3 hold - Seated Lumbar Flexion Stretch  - 2 x daily - 6 x weekly - 1 sets - 10 reps - 5 hold - Seated Quadratus Lumborum Stretch in Chair  - 2 x daily - 6 x weekly - 1 sets - 10 reps - 5 hold - Seated Straight Leg Raise with Quad Contraction  - 2 x daily - 6 x weekly - 1-2 sets - 10 reps - Standing Hip Abduction with Counter Support  - 2 x daily - 6 x weekly - 1 sets - 10 reps - Standing Lumbar Extension  - 2 x daily - 6 x weekly - 1 sets - 10 reps - 5 hold  PATIENT EDUCATION: Education details: HEP, PT plan of care Person educated: Patient Education method: Explanation, Demonstration, Verbal cues, and Handouts Education comprehension: verbalized understanding and needs further education     ASSESSMENT:   CLINICAL IMPRESSION: Pt arriving stating that PT has not helped with his pain. The exercises do not help. His measurements have not improved. At this point we will will discharge due to lack of progress and will refer him back to MD as MD is  recommending nerve ablation. I then feel that aquatic PT would be more beneficial after that.    OBJECTIVE IMPAIRMENTS: decreased activity tolerance, difficulty walking, decreased balance, decreased endurance, decreased mobility, decreased ROM, decreased strength, impaired flexibility, impaired UE/LE use, postural dysfunction, and pain.   ACTIVITY LIMITATIONS: bending, lifting, carry, locomotion, cleaning, community activity, driving   PERSONAL FACTORS:  PMH includes:  morbid obesity, HTN, pre-diabetes, COPD/ Asthma / OSA,knee OA,DVT,are also affecting patient's functional outcome.   REHAB POTENTIAL: Fair     CLINICAL DECISION MAKING: Evolving/moderate complexity   EVALUATION COMPLEXITY: Moderate       GOALS: Short term PT Goals Target date: 03/11/2022 Pt will be I and compliant with HEP. Baseline:  Goal status: the exercises are not helping, not met Pt will decrease pain by 25% overall Baseline: Goal status:not met, no change in pain   Long term PT goals Target date: 04/08/2022 Pt will improve lumbar ROM to at least 50% available to improve functional mobility Baseline: Goal status: not met, no change Pt will improve  hip/knee strength to at least 4+/5 MMT in sitting to improve functional strength Baseline: Goal status: Not met, no change Pt will improve FOTO to at least 37% functional to show improved function Baseline: Goal status: not met no change, still 34% Pt will reduce pain by overall 50% overall with usual activity Baseline: Goal status: no change, not met Pt will reduce TUG score to less than 20 seconds to show improved gait speed and balance. Baseline: Goal status: New   PLAN: PT FREQUENCY: 1 times per week    PT DURATION: 4-8 weeks   PLANNED INTERVENTIONS (unless contraindicated): aquatic PT, Canalith repositioning, cryotherapy, Electrical stimulation, Iontophoresis with 4 mg/ml dexamethasome, Moist heat, traction, Ultrasound, gait training, Therapeutic  exercise, balance training, neuromuscular re-education, patient/family education, prosthetic training, manual techniques, passive ROM, dry needling, taping, vasopnuematic device, vestibular, spinal manipulations, joint manipulations   PLAN FOR NEXT SESSION: DC today and referral back to MD.     Debbe Odea, PT, DPT 03/03/2022, 4:00 PM

## 2022-03-03 NOTE — Telephone Encounter (Signed)
Pt is ready to get scheduled for an ablation. 3 pt visits have been completed

## 2022-03-04 ENCOUNTER — Ambulatory Visit: Payer: BC Managed Care – PPO | Admitting: Allergy & Immunology

## 2022-03-04 ENCOUNTER — Encounter: Payer: Self-pay | Admitting: Allergy & Immunology

## 2022-03-04 VITALS — BP 140/84 | HR 101 | Temp 98.4°F | Resp 20 | Ht 72.0 in | Wt 348.0 lb

## 2022-03-04 DIAGNOSIS — B999 Unspecified infectious disease: Secondary | ICD-10-CM

## 2022-03-04 DIAGNOSIS — J4489 Other specified chronic obstructive pulmonary disease: Secondary | ICD-10-CM | POA: Diagnosis not present

## 2022-03-04 DIAGNOSIS — J3089 Other allergic rhinitis: Secondary | ICD-10-CM

## 2022-03-04 DIAGNOSIS — J302 Other seasonal allergic rhinitis: Secondary | ICD-10-CM

## 2022-03-04 DIAGNOSIS — J310 Chronic rhinitis: Secondary | ICD-10-CM

## 2022-03-04 NOTE — Progress Notes (Signed)
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 - with several breakthrough infections as of late with multiple hospitalizations (has been on doxycycline for a few years, but we may need to consider imunoglobulin supplementation   Asthma-COPD overlap syndrome with worsening pulmonary status since contracting COVID pneumonia in January 2023 - (NO LONGER ON DUPIXENT due to concern for "immune suppression", although I have tried to reassure him that this is not the case)   Bilateral pulmonary nodules - still no confirmed etiology of these   Hymenoptera allergy - EpiPen up to date   GERD - on PRN H2 blocker    Obesity - with resulting nerve impingement and joint pain   Chronic back pain - s/p recent neck surgery   Migraines - on botulinum injections   Polypharmacy   Normal echocardiogram (May 2023)  Plan/Recommendations:   1. 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 - We many need to consider starting immunoglobulin replacement in the future, especially in light of the fact that you are continuing with multiple infections. - I don't think we are quite there yet, but I could be convinced.   2. Asthma-COPD overlap syndrome - Lung testing looked much better today compared to the last time that I checked the breathing.  - We are going to work on getting the bottom of what is wrong with your lungs before we add an injectable medication again. - Consider adding Tezspire (blocks higher up than the Nucala and Dupixent (handout provided today).  - Daily controller medication(s): Trelegy one puff once daily - Prior to physical activity: albuterol 2 puffs 10-15 minutes before  physical activity. - Rescue medications: albuterol 4 puffs every 4-6 hours as needed or DuoNeb nebulizer one vial every 4-6 hours as needed - Asthma control goals:  * Full participation in all desired activities (may need albuterol before activity) * Albuterol use two time or less a week on average (not counting use with activity) * Cough interfering with sleep two time or less a month * Oral steroids no more than once a year * No hospitalizations   3. Chronic allergic rhinitis (sweet vernal grass, box elder, cat, weeds, ragweed, molds, cockroach, dust mite) - Continue with aszelastine/fluticasone 2 sprays per nostril up to twice daily. - Continue with saline mist 1-2 times daily.  - Continue with cetirizine '10mg'$  daily as needed for breakthrough symptoms. - Continue with montelukast '10mg'$  in the evening.  - We are going to work on getting a sinus CT to look at your nasal anatomy.   4. GERD - Continue famotidine as needed.  5. Return in about 3 months (around 06/04/2022).     Subjective:   Mark Homan. is a 61 y.o. male presenting today for follow up of  Chief Complaint  Patient presents with   Asthma-COPD overlap syndrome ([place])    2 mth f/u - Okay    Mark Harrell. has a history of the following: Patient Active Problem List   Diagnosis Date Noted   Unilateral primary osteoarthritis, left knee 02/24/2022   Unilateral primary osteoarthritis, right knee 02/24/2022   Abnormal CT of the chest 01/28/2022   Hypokalemia 09/26/2021  Dyspnea 09/25/2021   Acute deep vein thrombosis (DVT) of right lower extremity ([place]) 09/24/2021   OSA (obstructive sleep apnea) 09/24/2021   Herniation of cervical intervertebral disc with radiculopathy    Status post cervical discectomy 08/20/2020   Tracheomalacia 05/26/2019   Acquired tracheomalacia 05/26/2019   HNP (herniated nucleus pulposus) with myelopathy, cervical 05/23/2019    Class: Chronic   Spinal stenosis of cervical region  05/23/2019    Class: Chronic   Status post cervical spinal fusion 05/23/2019   Chronic respiratory failure with hypoxia (H[facility]) 04/12/2018   Bilateral lower extremity edema 12/29/2017   Hymenoptera allergy 11/10/2017   Family history of colonic polyps 08/07/2017   Myofascial pain 08/06/2017   Seasonal and perennial allergic rhinitis 04/23/2017   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/2017   Cervicalgia 04/01/2017   Recurrent pneumonia 03/09/2017   Other spondylosis with radiculopathy, cervical region 02/09/2017   Depression with anxiety 01/04/2017   Memory difficulty 12/05/2016   Migraine without aura and without status migrainosus, not intractable 10/30/2016   Lumbar radiculopathy 05/15/2016   Osteoarthritis of spine with radiculopathy, lumbar region 05/15/2016   Risk for falls 05/15/2016   Recurrent infections 03/29/2016   Chronic nonseasonal allergic rhinitis due to pollen 03/29/2016   Polypharmacy 01/16/2016   Morbid obesity with BMI of 45.0-49.9, adult ([place]) 03/06/2015   SDAT 02/05/2015   OSA and COPD overlap syndrome ([place]) 02/05/2015   Medication management 08/02/2014   GERD (gastroesophageal reflux disease) 05/09/2014   Vitamin D deficiency 08/01/2013   Prediabetes 08/01/2013   Positive TB test 07/29/2011   Diverticula of colon 05/07/2011   Hypertension 01/31/2011   Hyperlipidemia, mixed 01/31/2011   BPH (benign prostatic hyperplasia) 01/31/2011   Testosterone Deficiency 01/31/2011   IBS (irritable bowel syndrome) 01/31/2011   Partial complex seizure disorder with intractable epilepsy ([place]) 01/31/2011   Depression, major, recurrent, in partial remission ([place]) 01/31/2011   Asthma-COPD overlap syndrome 01/31/2011    History obtained from: chart review and patient.  Mark Harrell is a 61 y.o. male presenting for a follow up visit.  He was last seen in August 2023.  At that time, we continue with his prophylactic doxycycline 100 mg twice daily.  We recommended getting the booster vaccine.  We  talked about immunoglobulin replacement, but we did not think we were quite there yet.  His lung testing looked fairly stable.  He was very adamant about getting a bronchoscopy, so I reached out to his pulmonology team about this.  We stopped the Trelegy and started Perforomist and Pulmicort twice daily as well as albuterol as needed.  For his allergic rhinitis, we continue with Dymista as well as nasal saline and cetirizine.  Since the last visit, he has done fairly well.  Asthma/Respiratory Symptom History: He is now seeing Dr. Lamonte Sakai. He was supposed to get a new chest CT and scan.  Per the last note from Dr. Lamonte Sakai, he felt that this was NOT likely cancer because they were slightly smaller. And then after that they may do a bronchoscopy. He was previously seeing Dr. Rolla Etienne. Shravan is unsure what he is seeing someone else instead of Dr. Rolla Etienne. Breathing otherwise is fairly good. He remains on the Trelegy.  Per review of Dr. Agustina Caroli notes, he is ordering a combined chest CT and PET scan of his chest to see what might be going on before proceeding with a bronchoscopy.  He has previously been on [place].  However, this was held because there is concern of immunosuppression by one of his other  specialist.  He has been on the CPAP in the past but it was causing night terrors. He was breaking furniture and causing a mess in his house..   Allergic Rhinitis Symptom History: For his allergic rhinitis, he remains on the Dymista as well as the cetirizine and montelukast.  He continues to have a lot of congestion and postnasal drip.  He feels like there is obstruction in his nose somewhere.  He remains on his doxycycline twice daily.  He has seen ENT in the past.  He saw Dr. Lucia Gaskins back in January 2021.  He had a laryngoscopy at that time that was consistent with laryngopharyngeal reflux disease.  He has never had sinus surgery.  His mother is actually doing quite well.  She was recently removed from hospice  because she was doing so well.  Otherwise, there have been no changes to his past medical history, surgical history, family history, or social history.    Review of Systems  Constitutional: Negative.  Negative for chills, fever, malaise/fatigue and weight loss.  HENT:  Positive for congestion and sinus pain. Negative for ear discharge and ear pain.   Eyes:  Negative for pain, discharge and redness.  Respiratory:  Positive for cough. Negative for sputum production, shortness of breath and wheezing.   Cardiovascular: Negative.  Negative for chest pain and palpitations.  Gastrointestinal:  Negative for abdominal pain, constipation, diarrhea, heartburn, nausea and vomiting.  Skin: Negative.  Negative for itching and rash.  Neurological:  Negative for dizziness and headaches.  Endo/Heme/Allergies:  Negative for environmental allergies. Does not bruise/bleed easily.       Objective:   Blood pressure (!) 140/84, pulse (!) 101, temperature 98.4 F (36.9 C), resp. rate 20, height 6' (1.829 m), weight (!) 348 lb (157.9 kg), SpO2 92 %. Body mass index is 47.2 kg/m.    Physical Exam Vitals reviewed.  Constitutional:      Appearance: He is well-developed. He is morbidly obese. He is not ill-appearing or toxic-appearing.     Comments: Seems more talkative today.  HENT:     Head: Normocephalic and atraumatic.     Right Ear: Tympanic membrane, ear canal and external ear normal.     Left Ear: Tympanic membrane, ear canal and external ear normal.     Nose: No nasal deformity, septal deviation, mucosal edema or rhinorrhea.     Right Turbinates: Enlarged, swollen and pale.     Left Turbinates: Enlarged, swollen and pale.     Right Sinus: No maxillary sinus tenderness or frontal sinus tenderness.     Left Sinus: No maxillary sinus tenderness or frontal sinus tenderness.     Mouth/Throat:     Lips: Pink.     Mouth: Mucous membranes are moist. Mucous membranes are not pale and not dry.      Pharynx: Uvula midline.  Eyes:     General: Lids are normal. Allergic shiner present.        Right eye: No discharge.        Left eye: No discharge.     Conjunctiva/sclera: Conjunctivae normal.     Right eye: Right conjunctiva is not injected. No chemosis.    Left eye: Left conjunctiva is not injected. No chemosis.    Pupils: Pupils are equal, round, and reactive to light.  Cardiovascular:     Rate and Rhythm: Normal rate and regular rhythm.     Heart sounds: Normal heart sounds.  Pulmonary:     Effort: Pulmonary effort is  normal. No tachypnea, accessory muscle usage or respiratory distress.     Breath sounds: Normal breath sounds. No wheezing, rhonchi or rales.     Comments: Decreased air movement at the bases.  Chest:     Chest wall: No tenderness.  Lymphadenopathy:     Cervical: No cervical adenopathy.  Skin:    Coloration: Skin is not pale.     Findings: No abrasion, erythema, petechiae or rash. Rash is not papular, urticarial or vesicular.  Neurological:     Mental Status: He is alert.  Psychiatric:        Behavior: Behavior is cooperative.      Diagnostic studies:    Spirometry: results abnormal (FEV1: 2.10/56%, FVC: 3.19/65%, FEV1/FVC: 66%).    Spirometry consistent with mixed obstructive and restrictive disease.  Overall, spirometry seems consistent and stable.  Allergy Studies: none        [name], MD  [facility]

## 2022-03-04 NOTE — Patient Instructions (Addendum)
1. 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 - We many need to consider starting immunoglobulin replacement in the future, especially in light of the fact that you are continuing with multiple infections. - I don't think we are quite there yet, but I could be convinced.   2. Asthma-COPD overlap syndrome - Lung testing looked much better today compared to the last time that I checked the breathing.  - We are going to work on getting the bottom of what is wrong with your lungs before we add an injectable medication again. - Consider adding Tezspire (blocks higher up than the Nucala and Dupixent (handout provided today).  - Daily controller medication(s): Trelegy one puff once daily - Prior to physical activity: albuterol 2 puffs 10-15 minutes before physical activity. - Rescue medications: albuterol 4 puffs every 4-6 hours as needed or DuoNeb nebulizer one vial every 4-6 hours as needed - Asthma control goals:  * Full participation in all desired activities (may need albuterol before activity) * Albuterol use two time or less a week on average (not counting use with activity) * Cough interfering with sleep two time or less a month * Oral steroids no more than once a year * No hospitalizations   3. Chronic allergic rhinitis (sweet vernal grass, box elder, cat, weeds, ragweed, molds, cockroach, dust mite) - Continue with aszelastine/fluticasone 2 sprays per nostril up to twice daily. - Continue with saline mist 1-2 times daily.  - Continue with cetirizine '10mg'$  daily as needed for breakthrough symptoms. - Continue with montelukast '10mg'$  in the evening.  - We are going to work on getting a sinus CT to look at your nasal anatomy.   4. GERD - Continue famotidine as needed.  5. Return in about 3 months (around 06/04/2022).     Please inform us of any Emergency Department visits,  hospitalizations, or changes in symptoms. Call us before going to the ED for breathing or allergy symptoms since we might be able to fit you in for a sick visit. Feel free to contact us anytime with any questions, problems, or concerns.  It was a pleasure to see you again today!  Websites that have reliable patient information: 1. American Academy of Asthma, Allergy, and Immunology: www.aaaai.org 2. Food Allergy Research and Education (FARE): foodallergy.org 3. Mothers of Asthmatics: http://www.asthmacommunitynetwork.org 4. American College of Allergy, Asthma, and Immunology: www.acaai.org   COVID-19 Vaccine Information can be found at: ShippingScam.co.uk For questions related to vaccine distribution or appointments, please email [email or web address] or call [phone].   We realize that you might be concerned about having an allergic reaction to the COVID19 vaccines. To help with that concern, WE ARE OFFERING THE COVID19 VACCINES IN OUR OFFICE! Ask the front desk for dates!     "Like" [place] on Facebook and Instagram for our latest updates!      A healthy democracy works best when [employer] participate! Make sure you are registered to vote! If you have moved or changed any of your contact information, you will need to get this updated before voting!  In some cases, you MAY be able to register to vote online: [email or web address]

## 2022-03-05 LAB — IGG, IGA, IGM
IgA/Immunoglobulin A, Serum: 129 mg/dL (ref 61–437)
IgG (Immunoglobin G), Serum: 522 mg/dL — ABNORMAL LOW (ref 603–1613)
IgM (Immunoglobulin M), Srm: 49 mg/dL (ref 20–172)

## 2022-03-05 LAB — CBC WITH DIFFERENTIAL
Basophils Absolute: 0.1 10*3/uL (ref 0.0–0.2)
Basos: 1 %
EOS (ABSOLUTE): 0.2 10*3/uL (ref 0.0–0.4)
Eos: 2 %
Hematocrit: 45.6 % (ref 37.5–51.0)
Hemoglobin: 15.2 g/dL (ref 13.0–17.7)
Immature Grans (Abs): 0 10*3/uL (ref 0.0–0.1)
Immature Granulocytes: 0 %
Lymphocytes Absolute: 2.9 10*3/uL (ref 0.7–3.1)
Lymphs: 32 %
MCH: 28.9 pg (ref 26.6–33.0)
MCHC: 33.3 g/dL (ref 31.5–35.7)
MCV: 87 fL (ref 79–97)
Monocytes Absolute: 1 10*3/uL — ABNORMAL HIGH (ref 0.1–0.9)
Monocytes: 11 %
Neutrophils Absolute: 4.8 10*3/uL (ref 1.4–7.0)
Neutrophils: 54 %
RBC: 5.26 x10E6/uL (ref 4.14–5.80)
RDW: 13.6 % (ref 11.6–15.4)
WBC: 8.9 10*3/uL (ref 3.4–10.8)

## 2022-03-08 ENCOUNTER — Other Ambulatory Visit: Payer: Self-pay | Admitting: Allergy & Immunology

## 2022-03-08 ENCOUNTER — Other Ambulatory Visit: Payer: Self-pay | Admitting: Internal Medicine

## 2022-03-08 ENCOUNTER — Other Ambulatory Visit: Payer: Self-pay | Admitting: Nurse Practitioner

## 2022-03-08 DIAGNOSIS — R059 Cough, unspecified: Secondary | ICD-10-CM

## 2022-03-08 MED ORDER — BENZONATATE 200 MG PO CAPS: ORAL_CAPSULE | ORAL | 1 refills | Status: DC

## 2022-03-09 ENCOUNTER — Encounter: Payer: Self-pay | Admitting: Emergency Medicine

## 2022-03-10 ENCOUNTER — Telehealth: Payer: Self-pay

## 2022-03-10 NOTE — Telephone Encounter (Signed)
I have gotten the auth for the PET scan, however, pt's insurance will not approve the CT at [facility] want it at a stand alone facility.  Should we scheduled the CT at a different location or if it's ok to proceed with PET only?

## 2022-03-10 NOTE — Telephone Encounter (Signed)
Dr. Byrum please advise 

## 2022-03-10 NOTE — Telephone Encounter (Signed)
RB states to schedule PET and may order CT after he sees the pt.  I have scheduled the PET and gave appt info to pt.  I have closed the PET Super D order.  Will route back to triage to close the message.

## 2022-03-10 NOTE — Telephone Encounter (Signed)
Lets just do the PET alone. If we need to order a CT after I see him then we can

## 2022-03-10 NOTE — Telephone Encounter (Signed)
I have scheduled PET scan and spoke to pt & gave him appt info.  Estill Bamberg is rescheduling RB appt to 11/7.  I closed out the PET Super D order.  Nothing further needed.

## 2022-03-13 ENCOUNTER — Ambulatory Visit: Payer: BC Managed Care – PPO | Admitting: Emergency Medicine

## 2022-03-17 ENCOUNTER — Telehealth: Payer: Self-pay | Admitting: *Deleted

## 2022-03-17 ENCOUNTER — Other Ambulatory Visit: Payer: Self-pay

## 2022-03-17 ENCOUNTER — Encounter: Payer: Self-pay | Admitting: Allergy & Immunology

## 2022-03-17 ENCOUNTER — Other Ambulatory Visit: Payer: Self-pay | Admitting: Internal Medicine

## 2022-03-17 DIAGNOSIS — S161XXD Strain of muscle, fascia and tendon at neck level, subsequent encounter: Secondary | ICD-10-CM

## 2022-03-17 DIAGNOSIS — G43719 Chronic migraine without aura, intractable, without status migrainosus: Secondary | ICD-10-CM | POA: Diagnosis not present

## 2022-03-17 MED ORDER — CYCLOBENZAPRINE HCL 10 MG PO TABS: ORAL_TABLET | ORAL | 0 refills | Status: DC

## 2022-03-17 NOTE — Telephone Encounter (Signed)
Currently working on PA but there is a 20 minute wait to speak to a prior authorization representative. Will attempt to call back to initiate prior authorization.  CPT code [phone] CT Maxillofacial w/o contrast Diagnosis Code J31.0 Provider NPI [id number] [facility] NPI [id number] Phone number [phone]

## 2022-03-17 NOTE — Telephone Encounter (Signed)
-----   Message from [name], MD sent at 03/17/2022 10:15 AM EDT ----- Sinus CT ordered. Unsure if there is a PA?

## 2022-03-20 ENCOUNTER — Encounter (HOSPITAL_COMMUNITY)
Admission: RE | Admit: 2022-03-20 | Discharge: 2022-03-20 | Disposition: A | Discharge status: Home / Self Care | Payer: BC Managed Care – PPO | Source: Ambulatory Visit | Attending: Emergency Medicine | Admitting: Emergency Medicine

## 2022-03-20 DIAGNOSIS — R918 Other nonspecific abnormal finding of lung field: Secondary | ICD-10-CM | POA: Diagnosis not present

## 2022-03-20 DIAGNOSIS — R9389 Abnormal findings on diagnostic imaging of other specified body structures: Secondary | ICD-10-CM | POA: Diagnosis not present

## 2022-03-20 LAB — GLUCOSE, CAPILLARY: Glucose-Capillary: 106 mg/dL — ABNORMAL HIGH (ref 70–99)

## 2022-03-20 MED ORDER — FLUDEOXYGLUCOSE F - 18 (FDG) INJECTION
15.9600 | Freq: Once | INTRAVENOUS | Status: AC
  Administered 2022-03-20: 15.96 via INTRAVENOUS

## 2022-03-20 NOTE — Telephone Encounter (Signed)
Unfortunately we are unable to process for procedures/imaging; we are only able to process PA for prescription medications.

## 2022-03-21 NOTE — Telephone Encounter (Signed)
Pre cert was approved for [facility] as that is least cost effective for pt and insurnace approved 03/21/22 to 04/19/22 approval number [id number]. [address] [place] # [phone] fax [phone]  Tried to call pt but voicemail box is full will fax request over to [facility]

## 2022-03-27 DIAGNOSIS — I129 Hypertensive chronic kidney disease with stage 1 through stage 4 chronic kidney disease, or unspecified chronic kidney disease: Secondary | ICD-10-CM | POA: Diagnosis not present

## 2022-03-27 DIAGNOSIS — E559 Vitamin D deficiency, unspecified: Secondary | ICD-10-CM | POA: Diagnosis not present

## 2022-03-27 DIAGNOSIS — R809 Proteinuria, unspecified: Secondary | ICD-10-CM | POA: Diagnosis not present

## 2022-03-27 DIAGNOSIS — E1129 Type 2 diabetes mellitus with other diabetic kidney complication: Secondary | ICD-10-CM | POA: Diagnosis not present

## 2022-04-01 ENCOUNTER — Ambulatory Visit: Payer: BC Managed Care – PPO | Admitting: Emergency Medicine

## 2022-04-01 ENCOUNTER — Encounter: Payer: Self-pay | Admitting: Emergency Medicine

## 2022-04-01 VITALS — BP 136/78 | HR 85 | Temp 98.8°F | Ht 73.0 in | Wt 332.8 lb

## 2022-04-01 DIAGNOSIS — R9389 Abnormal findings on diagnostic imaging of other specified body structures: Secondary | ICD-10-CM | POA: Diagnosis not present

## 2022-04-01 NOTE — Patient Instructions (Signed)
We will work on arranging navigational bronchoscopy to evaluate bilateral lower lobe pulmonary nodules.  This would be done under general anesthesia as an outpatient at [facility] endoscopy.  You will need a designated driver.  We will try to get this arranged for 04/28/2022. We will order a CT scan of the chest.  We will try to get this done on 04/07/2022 so that we can coordinate with your maxillofacial CT Follow Dr. Lamonte Sakai in 1 month or next available

## 2022-04-01 NOTE — H&P (View-Only) (Signed)
Subjective:    Patient ID: Mark Harrell., male    DOB: 03/31/1961, 61 y.o.   MRN: [id number]  HPI 61 year old former smoker (<1 pk/yr)  who has been followed in our office for asthma/COPD, OSA no longer on CPAP.  Has been treated in the past with Biologics including Nucala, Dupixent.  He is referred today for abnormal CT scan of the chest. He has had a complicated 8299, was diagnosed with COVID-pneumonia in 05/2021, then with bilateral pneumonia in 08/2021 requiring admission in May.  He had a DVT without PE and was started on Eliquis during that hospitalization.  His CT scan has never really cleared in fact he has had progressive nodular infiltrates noted as below, question organizing pneumonia versus possible malignancy. He states that he has ups and downs w his breathing, may be feeling more SOB, more run-down than after her was discharged in May. He is on trelegy, uses albuterol and duoneb as needed, about 1-2x a day. Unable to tolerate CPAP.   CT chest 12/12/2021 reviewed by me shows confluent masslike nodular opacities in the medial subpleural bilateral lower lobes, slightly improved compared with prior 09/2021 but persistent.  Largest component in the deep medial right costophrenic recess 2.9 cm.   ROV 04/01/2022 --follow-up visit for 61 year old gentleman with a minimal tobacco history, asthma/COPD (previous Nucala, Dupixent), OSA no longer on CPAP, DVT.  He had COVID-pneumonia and bilateral bacterial pneumonia in early 2023.  I subsequently saw him for abnormal CT scan of the chest with some confluent masslike nodular opacities.  These seemed to be smaller.  I had recommended PET scan/CT scan in October to evaluate further.  Ultimately the PET scan was done without the CT chest as below.  PET scan 03/20/2022 reviewed by me showed that the rounded component of the bilateral lower lobe pulmonary nodules are unchanged in size or appearance.  There is some adjacent associated hypermetabolism  although the larger components of the nodules do not appear to be hypermetabolic.  No thoracic adenopathy or metastatic disease   Review of Systems As per HPI  Past Medical History:  Diagnosis Date   Anesthesia complication requiring reversal agent administration    ? from central apnea, very difficult to get off vent   Anxiety    Arthritis    osteo   Asthma    BPH (benign prostatic hyperplasia)    Complication of anesthesia    difficulty waking , they twlight me because of my respiratory problems "   Depression    Dyspnea    on exertion   Enlarged heart    Family history of adverse reaction to anesthesia    mother trouble waking up, and heart stopped   GERD (gastroesophageal reflux disease)    Headache    botox injections for headaches   Hyperlipidemia    Hypertension    Hypogonadism male    IBS (irritable bowel syndrome)    Memory difficulties    short term memories   Neuropathy    Obesity    On home oxygen therapy    on 2 liter   OSA (obstructive sleep apnea)    not using cpap   Paralysis (HCC)    left hand - small   Pneumonia    Pre-diabetes    Prostatitis      Family History  Problem Relation Age of Onset   Diabetes Paternal Uncle    Cancer Father        lymphoma, colon  Diabetes Maternal Grandmother    Heart disease Maternal Grandfather    Diabetes Maternal Grandfather    Diabetes Paternal Grandmother    Diabetes Paternal Grandfather    Dementia Mother    Prostate cancer Maternal Uncle    Lung disease Neg Hx    Rheumatologic disease Neg Hx      Social History   Socioeconomic History   Marital status: Single    Spouse name: Not on file   Number of children: Not on file   Years of education: Not on file   Highest education level: Not on file  Occupational History   Occupation: Dance movement psychotherapist  Tobacco Use   Smoking status: Former    Packs/day: 0.10    Years: 15.00    Total pack years: 1.50    Types: Cigarettes    Quit date:  05/27/1983    Years since quitting: 38.8    Passive exposure: Never   Smokeless tobacco: Never   Tobacco comments:    significant second-hand exposure through mother  Vaping Use   Vaping Use: Never used  Substance and Sexual Activity   Alcohol use: Yes    Alcohol/week: 1.0 standard drink of alcohol    Types: 1 Glasses of wine per week    Comment: 2 x a year   Drug use: No   Sexual activity: Never    Birth control/protection: None  Other Topics Concern   Not on file  Social History Narrative    Pulmonary:   Originally from [place]. Previously has lived in [place]. He has lived in [place], [place], & [place]. He has worked in Engineer, production. No pets currently. Brief exposure to a roommates bird Secretary/administrator) in college. No mold, asbestos, or hot tub exposure.    Social Determinants of Health   Financial Resource Strain: Not on file  Food Insecurity: No Food Insecurity (10/08/2021)   Hunger Vital Sign    Worried About Running Out of Food in the Last Year: Never true    Ran Out of Food in the Last Year: Never true  Transportation Needs: No Transportation Needs (10/08/2021)   PRAPARE - Hydrologist (Medical): No    Lack of Transportation (Non-Medical): No  Physical Activity: Not on file  Stress: Not on file  Social Connections: Not on file  Intimate Partner Violence: Not on file     Allergies  Allergen Reactions   Bee Venom Swelling   Cymbalta [Duloxetine Hcl] Shortness Of Breath    Brought on asthma   Ppd [Tuberculin Purified Protein Derivative] Other (See Comments)    +ppd NEG Quantferron Gold 3/13 (shows false positive)    Calan [Verapamil] Other (See Comments)    Back pain   Tricor [Fenofibrate] Other (See Comments)    Back pain   Claritin [Loratadine] Other (See Comments)    Unknown reaction    Levaquin [Levofloxacin] Diarrhea     Outpatient Medications Prior to Visit  Medication Sig Dispense Refill   acetaminophen  (TYLENOL) 500 MG tablet Take 1,000 mg by mouth 3 (three) times daily.     anastrozole (ARIMIDEX) 1 MG tablet Take 1 mg by mouth daily.     apixaban (ELIQUIS) 5 MG TABS tablet Take 1 tablet (5 mg total) by mouth 2 (two) times daily. 60 tablet 1   arformoterol (BROVANA) 15 MCG/2ML NEBU Take 2 mLs (15 mcg total) by nebulization in the morning and at bedtime. 120 mL 5   ascorbic acid (  VITAMIN C) 1000 MG tablet Take 1,000 mg by mouth every evening.     aspirin EC 81 MG tablet Take 81 mg by mouth daily. Swallow whole.     Azelastine HCl 137 MCG/SPRAY SOLN USE 1 TO 2 SPRAYS EACH NOSTRIL 2 X /DAY 30 mL 5   benzonatate (TESSALON) 200 MG capsule Take 1 capsule 3 x /day to Prevent Cough                                                     /                                  TAKE                               BY                              MOUTH 90 capsule 1   Botulinum Toxin Type A 200 units SOLR Inject 200 Units into the skin every 3 (three) months.     budesonide (PULMICORT) 0.5 MG/2ML nebulizer solution TAKE 2 MLS (0.5 MG TOTAL) BY NEBULIZATION 3 (THREE) TIMES DAILY AS NEEDED. 120 mL 5   Calcium Carb-Cholecalciferol (CALCIUM + VITAMIN D3 PO) Take 1 tablet by mouth daily.     celecoxib (CELEBREX) 200 MG capsule TAKE 1 CAPSULE (200 MG TOTAL) BY MOUTH EVERY 12 (TWELVE) HOURS. 60 capsule 2   cetaphil (CETAPHIL) lotion Apply topically 2 (two) times daily. (Patient taking differently: Apply 1 application  topically 2 (two) times daily.) 236 mL 0   cetirizine (ZYRTEC) 10 MG tablet Take 1 tablet (10 mg total) by mouth 2 (two) times daily as needed for allergies (Can take an extra dose during flares). (Patient taking differently: Take 10 mg by mouth in the morning.) 60 tablet 5   Cholecalciferol (VITAMIN D3) 125 MCG (5000 UT) TABS Take 10,000 Units by mouth 2 (two) times daily. Morning and evening     Cinnamon 500 MG capsule Take 1,000 mg by mouth 3 (three) times daily.     Coenzyme Q10 (CO Q 10 PO) Take 600 mg by  mouth daily.     cyclobenzaprine (FLEXERIL) 10 MG tablet TAKE 1/2 TO 1 TABLET 3 TIMES A DAY ONLY IF NEED FOR MUSCLE SPASM (NECK) 90 tablet 0   diclofenac Sodium (VOLTAREN) 1 % GEL APPLY 2 TO 4 GRAMS TOPICALLY 2 TO 4 TIMES DAILY FOR PAIN & INFLAMMATION( OTC NOT COVERED) (Patient taking differently: 2-4 g 2 (two) times daily as needed (pain).) 300 g 3   donepezil (ARICEPT) 23 MG TABS tablet Take 1 tablet Daily for Memory 90 tablet 3   doxycycline (ADOXA) 100 MG tablet TAKE 1 TABLET BY MOUTH TWICE A DAY 60 tablet 3   EPINEPHrine 0.3 mg/0.3 mL IJ SOAJ injection Inject 0.3 mg into the muscle as needed. (Patient taking differently: Inject 0.3 mg into the muscle as needed for anaphylaxis.) 2 each 2   fluticasone (FLONASE) 50 MCG/ACT nasal spray PLACE 2 SPRAYS INTO BOTH NOSTRILS DAILY. USE 1 TO 2 SPRAYS EACH NOSTRIL 2 X /DAY 16 mL 0   furosemide (LASIX) 80 MG  tablet Take 80 mg by mouth 2 (two) times daily.     Guaifenesin 1200 MG TB12 Take 1,200 mg by mouth 3 (three) times daily.     ipratropium-albuterol (DUONEB) 0.5-2.5 (3) MG/3ML SOLN TAKE 3 MLS BY NEBULIZATION EVERY 4 (FOUR) HOURS AS NEEDED. 270 mL 1   Krill Oil 500 MG CAPS Take 500 mg by mouth 2 (two) times daily.     Magnesium 500 MG CAPS Take 500 mg by mouth 3 (three) times daily.     memantine (NAMENDA) 10 MG tablet Take 10 mg by mouth 2 (two) times daily.     metFORMIN (GLUCOPHAGE-XR) 500 MG 24 hr tablet TAKES 2 TABLETS 2 X /DAY WITH MEALS FOR DIABETES 360 tablet 3   metoprolol tartrate (LOPRESSOR) 25 MG tablet Take  1 tablet  2 x /day (every 12 hours)  for BP                                           /                               TAKE                 BY                  MOUTH 180 tablet 3   Misc Natural Products (GLUCOS-CHONDROIT-MSM COMPLEX) TABS Take 2 tablets by mouth 3 (three) times daily.     montelukast (SINGULAIR) 10 MG tablet TAKE 1 TABLET BY MOUTH DAILY FOR ALLERGIES 90 tablet 0   Multiple Vitamins-Minerals (MULTIVITAMIN WITH MINERALS)  tablet Take 1 tablet by mouth daily.     Multiple Vitamins-Minerals (PRESERVISION AREDS 2) CAPS Take 1 capsule by mouth 2 (two) times daily.     oxymetazoline (AFRIN) 0.05 % nasal spray Place 1 spray into both nostrils at bedtime.     OZEMPIC, 2 MG/DOSE, 8 MG/3ML SOPN SMARTSIG:2 Milligram(s) Topical Once a Week     pravastatin (PRAVACHOL) 40 MG tablet TAKE 1 TABLET BY MOUTH AT BEDTIME FOR CHOLESTEROL (Patient taking differently: Take 40 mg by mouth daily.) 90 tablet 3   pregabalin (LYRICA) 150 MG capsule Take  1 capsule  3 x /day for Chronic Pain                                          /                          TAKE                              BY               MOUTH (Patient taking differently: Take 150 mg by mouth 3 (three) times daily.) 270 capsule 1   PROBIOTIC PRODUCT PO Take 1 capsule by mouth 2 (two) times daily. VSL     promethazine-dextromethorphan (PROMETHAZINE-DM) 6.25-15 MG/5ML syrup Take  1 teaspoon (5 ml)  every 4 hours  as needed  for Cough                                   /  TAKE            BY                MOUTH (Patient taking differently: Take 5 mLs by mouth 4 (four) times daily as needed for cough.) 240 mL 3   sertraline (ZOLOFT) 100 MG tablet Take  2 tablets  Daily  for Mood / Depression 180 tablet 3   sodium chloride (OCEAN) 0.65 % SOLN nasal spray Place 1 spray into both nostrils 4 (four) times daily as needed for congestion. Uses each time before other nasal sprays     tadalafil (CIALIS) 5 MG tablet Take 5 mg by mouth daily.      trospium (SANCTURA) 20 MG tablet Take 20 mg by mouth 2 (two) times daily. In the evening and at bedtime     TURMERIC PO Take 2 tablets by mouth 3 (three) times daily.     VENTOLIN HFA 108 (90 Base) MCG/ACT inhaler INHALE 2 PUFFS BY MOUTH EVERY 4 HOURS AS NEEDED FOR WHEEZE OR FOR SHORTNESS OF BREATH (Patient taking differently: Inhale 2 puffs into the lungs every 4 (four) hours as needed for wheezing or shortness of  breath.) 18 each 1   zinc gluconate 50 MG tablet Take 50 mg by mouth 3 (three) times daily.     potassium chloride SA (KLOR-CON M) 20 MEQ tablet Take 2 tablets (40 mEq total) by mouth 2 (two) times daily. 120 tablet 0   No facility-administered medications prior to visit.        Objective:   Physical Exam  Vitals:   04/01/22 1519  BP: 136/78  Pulse: 85  Temp: 98.8 F (37.1 C)  TempSrc: Oral  SpO2: 95%  Weight: (!) 332 lb 12.8 oz (151 kg)  Height: '6\' 1"'$  (1.854 m)   Gen: Pleasant, obese, in no distress,  normal affect  ENT: No lesions,  mouth clear,  oropharynx clear, no postnasal drip  Neck: No JVD, no stridor  Lungs: No use of accessory muscles, no crackles or wheezing on normal respiration, no wheeze on forced expiration  Cardiovascular: RRR, heart sounds normal, no murmur or gallops, trace peripheral edema  Musculoskeletal: No deformities, no cyanosis or clubbing  Neuro: alert, awake, non focal  Skin: Warm, no lesions or rash      Assessment & Plan:   Abnormal CT of the chest He has bilateral lower lobe rounded opacities that have not changed in size or appearance since his last CT, are actually smaller than on CT scans from spring 2023 when he had pneumonia.  That said there is some surrounding hypermetabolism on his PET scan.  We talked about the options that include serial imaging versus working to achieve a tissue diagnosis.  He would like to pursue bronchoscopy.  The lesions are at the very most basilar aspect of both lower lobes.  May need to consider doing navigational bronchoscopy in the decubitus position to allow [place] to approach without dealing with atelectasis.  He will need a repeat CT chest to help plan navigational bronchoscopy.  I will try to get this scheduled for 04/28/2022.  We will work on arranging navigational bronchoscopy to evaluate bilateral lower lobe pulmonary nodules.  This would be done under general anesthesia as an outpatient at [facility]  endoscopy.  You will need a designated driver.  We will try to get this arranged for 04/28/2022. We will order a CT scan of the chest.  We will try to get this done  on 04/07/2022 so that we can coordinate with your maxillofacial CT Follow Dr. Lamonte Sakai in 1 month or next available   Baltazar Apo, MD, PhD 04/01/2022, 4:30 PM [facility] [phone] or if no answer before 7:00PM call [phone] For any issues after 7:00PM please call eLink [phone]

## 2022-04-01 NOTE — Progress Notes (Signed)
Subjective:    Patient ID: Mark Harrell., male    DOB: 05-17-1961, 61 y.o.   MRN: [id number]  HPI 61 year old former smoker (<1 pk/yr)  who has been followed in our office for asthma/COPD, OSA no longer on CPAP.  Has been treated in the past with Biologics including Nucala, Dupixent.  He is referred today for abnormal CT scan of the chest. He has had a complicated 5188, was diagnosed with COVID-pneumonia in 05/2021, then with bilateral pneumonia in 08/2021 requiring admission in May.  He had a DVT without PE and was started on Eliquis during that hospitalization.  His CT scan has never really cleared in fact he has had progressive nodular infiltrates noted as below, question organizing pneumonia versus possible malignancy. He states that he has ups and downs w his breathing, may be feeling more SOB, more run-down than after her was discharged in May. He is on trelegy, uses albuterol and duoneb as needed, about 1-2x a day. Unable to tolerate CPAP.   CT chest 12/12/2021 reviewed by me shows confluent masslike nodular opacities in the medial subpleural bilateral lower lobes, slightly improved compared with prior 09/2021 but persistent.  Largest component in the deep medial right costophrenic recess 2.9 cm.   ROV 04/01/2022 --follow-up visit for 61 year old gentleman with a minimal tobacco history, asthma/COPD (previous Nucala, Dupixent), OSA no longer on CPAP, DVT.  He had COVID-pneumonia and bilateral bacterial pneumonia in early 2023.  I subsequently saw him for abnormal CT scan of the chest with some confluent masslike nodular opacities.  These seemed to be smaller.  I had recommended PET scan/CT scan in October to evaluate further.  Ultimately the PET scan was done without the CT chest as below.  PET scan 03/20/2022 reviewed by me showed that the rounded component of the bilateral lower lobe pulmonary nodules are unchanged in size or appearance.  There is some adjacent associated hypermetabolism  although the larger components of the nodules do not appear to be hypermetabolic.  No thoracic adenopathy or metastatic disease   Review of Systems As per HPI  Past Medical History:  Diagnosis Date   Anesthesia complication requiring reversal agent administration    ? from central apnea, very difficult to get off vent   Anxiety    Arthritis    osteo   Asthma    BPH (benign prostatic hyperplasia)    Complication of anesthesia    difficulty waking , they twlight me because of my respiratory problems "   Depression    Dyspnea    on exertion   Enlarged heart    Family history of adverse reaction to anesthesia    mother trouble waking up, and heart stopped   GERD (gastroesophageal reflux disease)    Headache    botox injections for headaches   Hyperlipidemia    Hypertension    Hypogonadism male    IBS (irritable bowel syndrome)    Memory difficulties    short term memories   Neuropathy    Obesity    On home oxygen therapy    on 2 liter   OSA (obstructive sleep apnea)    not using cpap   Paralysis (HCC)    left hand - small   Pneumonia    Pre-diabetes    Prostatitis      Family History  Problem Relation Age of Onset   Diabetes Paternal Uncle    Cancer Father        lymphoma, colon  Diabetes Maternal Grandmother    Heart disease Maternal Grandfather    Diabetes Maternal Grandfather    Diabetes Paternal Grandmother    Diabetes Paternal Grandfather    Dementia Mother    Prostate cancer Maternal Uncle    Lung disease Neg Hx    Rheumatologic disease Neg Hx      Social History   Socioeconomic History   Marital status: Single    Spouse name: Not on file   Number of children: Not on file   Years of education: Not on file   Highest education level: Not on file  Occupational History   Occupation: Dance movement psychotherapist  Tobacco Use   Smoking status: Former    Packs/day: 0.10    Years: 15.00    Total pack years: 1.50    Types: Cigarettes    Quit date:  05/27/1983    Years since quitting: 38.8    Passive exposure: Never   Smokeless tobacco: Never   Tobacco comments:    significant second-hand exposure through mother  Vaping Use   Vaping Use: Never used  Substance and Sexual Activity   Alcohol use: Yes    Alcohol/week: 1.0 standard drink of alcohol    Types: 1 Glasses of wine per week    Comment: 2 x a year   Drug use: No   Sexual activity: Never    Birth control/protection: None  Other Topics Concern   Not on file  Social History Narrative   [facility]:   Originally from [place]. Previously has lived in [place]. He has lived in [place], [place], & [place]. He has worked in Engineer, production. No pets currently. Brief exposure to a roommates bird Secretary/administrator) in college. No mold, asbestos, or hot tub exposure.    Social Determinants of Health   Financial Resource Strain: Not on file  Food Insecurity: No Food Insecurity (10/08/2021)   Hunger Vital Sign    Worried About Running Out of Food in the Last Year: Never true    Ran Out of Food in the Last Year: Never true  Transportation Needs: No Transportation Needs (10/08/2021)   PRAPARE - Hydrologist (Medical): No    Lack of Transportation (Non-Medical): No  Physical Activity: Not on file  Stress: Not on file  Social Connections: Not on file  Intimate Partner Violence: Not on file     Allergies  Allergen Reactions   Bee Venom Swelling   Cymbalta [Duloxetine Hcl] Shortness Of Breath    Brought on asthma   Ppd [Tuberculin Purified Protein Derivative] Other (See Comments)    +ppd NEG Quantferron Gold 3/13 (shows false positive)    Calan [Verapamil] Other (See Comments)    Back pain   Tricor [Fenofibrate] Other (See Comments)    Back pain   Claritin [Loratadine] Other (See Comments)    Unknown reaction    Levaquin [Levofloxacin] Diarrhea     Outpatient Medications Prior to Visit  Medication Sig Dispense Refill   acetaminophen  (TYLENOL) 500 MG tablet Take 1,000 mg by mouth 3 (three) times daily.     anastrozole (ARIMIDEX) 1 MG tablet Take 1 mg by mouth daily.     apixaban (ELIQUIS) 5 MG TABS tablet Take 1 tablet (5 mg total) by mouth 2 (two) times daily. 60 tablet 1   arformoterol (BROVANA) 15 MCG/2ML NEBU Take 2 mLs (15 mcg total) by nebulization in the morning and at bedtime. 120 mL 5   ascorbic acid (  VITAMIN C) 1000 MG tablet Take 1,000 mg by mouth every evening.     aspirin EC 81 MG tablet Take 81 mg by mouth daily. Swallow whole.     Azelastine HCl 137 MCG/SPRAY SOLN USE 1 TO 2 SPRAYS EACH NOSTRIL 2 X /DAY 30 mL 5   benzonatate (TESSALON) 200 MG capsule Take 1 capsule 3 x /day to Prevent Cough                                                     /                                  TAKE                               BY                              MOUTH 90 capsule 1   Botulinum Toxin Type A 200 units SOLR Inject 200 Units into the skin every 3 (three) months.     budesonide (PULMICORT) 0.5 MG/2ML nebulizer solution TAKE 2 MLS (0.5 MG TOTAL) BY NEBULIZATION 3 (THREE) TIMES DAILY AS NEEDED. 120 mL 5   Calcium Carb-Cholecalciferol (CALCIUM + VITAMIN D3 PO) Take 1 tablet by mouth daily.     celecoxib (CELEBREX) 200 MG capsule TAKE 1 CAPSULE (200 MG TOTAL) BY MOUTH EVERY 12 (TWELVE) HOURS. 60 capsule 2   cetaphil (CETAPHIL) lotion Apply topically 2 (two) times daily. (Patient taking differently: Apply 1 application  topically 2 (two) times daily.) 236 mL 0   cetirizine (ZYRTEC) 10 MG tablet Take 1 tablet (10 mg total) by mouth 2 (two) times daily as needed for allergies (Can take an extra dose during flares). (Patient taking differently: Take 10 mg by mouth in the morning.) 60 tablet 5   Cholecalciferol (VITAMIN D3) 125 MCG (5000 UT) TABS Take 10,000 Units by mouth 2 (two) times daily. Morning and evening     Cinnamon 500 MG capsule Take 1,000 mg by mouth 3 (three) times daily.     Coenzyme Q10 (CO Q 10 PO) Take 600 mg by  mouth daily.     cyclobenzaprine (FLEXERIL) 10 MG tablet TAKE 1/2 TO 1 TABLET 3 TIMES A DAY ONLY IF NEED FOR MUSCLE SPASM (NECK) 90 tablet 0   diclofenac Sodium (VOLTAREN) 1 % GEL APPLY 2 TO 4 GRAMS TOPICALLY 2 TO 4 TIMES DAILY FOR PAIN & INFLAMMATION( OTC NOT COVERED) (Patient taking differently: 2-4 g 2 (two) times daily as needed (pain).) 300 g 3   donepezil (ARICEPT) 23 MG TABS tablet Take 1 tablet Daily for Memory 90 tablet 3   doxycycline (ADOXA) 100 MG tablet TAKE 1 TABLET BY MOUTH TWICE A DAY 60 tablet 3   EPINEPHrine 0.3 mg/0.3 mL IJ SOAJ injection Inject 0.3 mg into the muscle as needed. (Patient taking differently: Inject 0.3 mg into the muscle as needed for anaphylaxis.) 2 each 2   fluticasone (FLONASE) 50 MCG/ACT nasal spray PLACE 2 SPRAYS INTO BOTH NOSTRILS DAILY. USE 1 TO 2 SPRAYS EACH NOSTRIL 2 X /DAY 16 mL 0   furosemide (LASIX) 80 MG  tablet Take 80 mg by mouth 2 (two) times daily.     Guaifenesin 1200 MG TB12 Take 1,200 mg by mouth 3 (three) times daily.     ipratropium-albuterol (DUONEB) 0.5-2.5 (3) MG/3ML SOLN TAKE 3 MLS BY NEBULIZATION EVERY 4 (FOUR) HOURS AS NEEDED. 270 mL 1   Krill Oil 500 MG CAPS Take 500 mg by mouth 2 (two) times daily.     Magnesium 500 MG CAPS Take 500 mg by mouth 3 (three) times daily.     memantine (NAMENDA) 10 MG tablet Take 10 mg by mouth 2 (two) times daily.     metFORMIN (GLUCOPHAGE-XR) 500 MG 24 hr tablet TAKES 2 TABLETS 2 X /DAY WITH MEALS FOR DIABETES 360 tablet 3   metoprolol tartrate (LOPRESSOR) 25 MG tablet Take  1 tablet  2 x /day (every 12 hours)  for BP                                           /                               TAKE                 BY                  MOUTH 180 tablet 3   Misc Natural Products (GLUCOS-CHONDROIT-MSM COMPLEX) TABS Take 2 tablets by mouth 3 (three) times daily.     montelukast (SINGULAIR) 10 MG tablet TAKE 1 TABLET BY MOUTH DAILY FOR ALLERGIES 90 tablet 0   Multiple Vitamins-Minerals (MULTIVITAMIN WITH MINERALS)  tablet Take 1 tablet by mouth daily.     Multiple Vitamins-Minerals (PRESERVISION AREDS 2) CAPS Take 1 capsule by mouth 2 (two) times daily.     oxymetazoline (AFRIN) 0.05 % nasal spray Place 1 spray into both nostrils at bedtime.     OZEMPIC, 2 MG/DOSE, 8 MG/3ML SOPN SMARTSIG:2 Milligram(s) Topical Once a Week     pravastatin (PRAVACHOL) 40 MG tablet TAKE 1 TABLET BY MOUTH AT BEDTIME FOR CHOLESTEROL (Patient taking differently: Take 40 mg by mouth daily.) 90 tablet 3   pregabalin (LYRICA) 150 MG capsule Take  1 capsule  3 x /day for Chronic Pain                                          /                          TAKE                              BY               MOUTH (Patient taking differently: Take 150 mg by mouth 3 (three) times daily.) 270 capsule 1   PROBIOTIC PRODUCT PO Take 1 capsule by mouth 2 (two) times daily. VSL     promethazine-dextromethorphan (PROMETHAZINE-DM) 6.25-15 MG/5ML syrup Take  1 teaspoon (5 ml)  every 4 hours  as needed  for Cough                                   /  TAKE            BY                MOUTH (Patient taking differently: Take 5 mLs by mouth 4 (four) times daily as needed for cough.) 240 mL 3   sertraline (ZOLOFT) 100 MG tablet Take  2 tablets  Daily  for Mood / Depression 180 tablet 3   sodium chloride (OCEAN) 0.65 % SOLN nasal spray Place 1 spray into both nostrils 4 (four) times daily as needed for congestion. Uses each time before other nasal sprays     tadalafil (CIALIS) 5 MG tablet Take 5 mg by mouth daily.      trospium (SANCTURA) 20 MG tablet Take 20 mg by mouth 2 (two) times daily. In the evening and at bedtime     TURMERIC PO Take 2 tablets by mouth 3 (three) times daily.     VENTOLIN HFA 108 (90 Base) MCG/ACT inhaler INHALE 2 PUFFS BY MOUTH EVERY 4 HOURS AS NEEDED FOR WHEEZE OR FOR SHORTNESS OF BREATH (Patient taking differently: Inhale 2 puffs into the lungs every 4 (four) hours as needed for wheezing or shortness of  breath.) 18 each 1   zinc gluconate 50 MG tablet Take 50 mg by mouth 3 (three) times daily.     potassium chloride SA (KLOR-CON M) 20 MEQ tablet Take 2 tablets (40 mEq total) by mouth 2 (two) times daily. 120 tablet 0   No facility-administered medications prior to visit.        Objective:   Physical Exam  Vitals:   04/01/22 1519  BP: 136/78  Pulse: 85  Temp: 98.8 F (37.1 C)  TempSrc: Oral  SpO2: 95%  Weight: (!) 332 lb 12.8 oz (151 kg)  Height: '6\' 1"'$  (1.854 m)   Gen: Pleasant, obese, in no distress,  normal affect  ENT: No lesions,  mouth clear,  oropharynx clear, no postnasal drip  Neck: No JVD, no stridor  Lungs: No use of accessory muscles, no crackles or wheezing on normal respiration, no wheeze on forced expiration  Cardiovascular: RRR, heart sounds normal, no murmur or gallops, trace peripheral edema  Musculoskeletal: No deformities, no cyanosis or clubbing  Neuro: alert, awake, non focal  Skin: Warm, no lesions or rash      Assessment & Plan:   Abnormal CT of the chest He has bilateral lower lobe rounded opacities that have not changed in size or appearance since his last CT, are actually smaller than on CT scans from spring 2023 when he had pneumonia.  That said there is some surrounding hypermetabolism on his PET scan.  We talked about the options that include serial imaging versus working to achieve a tissue diagnosis.  He would like to pursue bronchoscopy.  The lesions are at the very most basilar aspect of both lower lobes.  May need to consider doing navigational bronchoscopy in the decubitus position to allow [place] to approach without dealing with atelectasis.  He will need a repeat CT chest to help plan navigational bronchoscopy.  I will try to get this scheduled for 04/28/2022.  We will work on arranging navigational bronchoscopy to evaluate bilateral lower lobe pulmonary nodules.  This would be done under general anesthesia as an outpatient at [facility]  endoscopy.  You will need a designated driver.  We will try to get this arranged for 04/28/2022. We will order a CT scan of the chest.  We will try to get this done  on 04/07/2022 so that we can coordinate with your maxillofacial CT Follow Dr. Lamonte Sakai in 1 month or next available   Baltazar Apo, MD, PhD 04/01/2022, 4:30 PM [facility] [phone] or if no answer before 7:00PM call [phone] For any issues after 7:00PM please call eLink [phone]

## 2022-04-01 NOTE — Assessment & Plan Note (Signed)
He has bilateral lower lobe rounded opacities that have not changed in size or appearance since his last CT, are actually smaller than on CT scans from spring 2023 when he had pneumonia.  That said there is some surrounding hypermetabolism on his PET scan.  We talked about the options that include serial imaging versus working to achieve a tissue diagnosis.  He would like to pursue bronchoscopy.  The lesions are at the very most basilar aspect of both lower lobes.  May need to consider doing navigational bronchoscopy in the decubitus position to allow [place] to approach without dealing with atelectasis.  He will need a repeat CT chest to help plan navigational bronchoscopy.  I will try to get this scheduled for 04/28/2022.  We will work on arranging navigational bronchoscopy to evaluate bilateral lower lobe pulmonary nodules.  This would be done under general anesthesia as an outpatient at [facility] endoscopy.  You will need a designated driver.  We will try to get this arranged for 04/28/2022. We will order a CT scan of the chest.  We will try to get this done on 04/07/2022 so that we can coordinate with your maxillofacial CT Follow Dr. Lamonte Sakai in 1 month or next available

## 2022-04-02 ENCOUNTER — Other Ambulatory Visit: Payer: Self-pay | Admitting: Internal Medicine

## 2022-04-02 ENCOUNTER — Other Ambulatory Visit: Payer: Self-pay | Admitting: Allergy & Immunology

## 2022-04-02 DIAGNOSIS — J4541 Moderate persistent asthma with (acute) exacerbation: Secondary | ICD-10-CM

## 2022-04-03 ENCOUNTER — Ambulatory Visit (INDEPENDENT_AMBULATORY_CARE_PROVIDER_SITE_OTHER): Payer: BC Managed Care – PPO | Admitting: Podiatry

## 2022-04-03 DIAGNOSIS — M79676 Pain in unspecified toe(s): Secondary | ICD-10-CM

## 2022-04-03 DIAGNOSIS — M722 Plantar fascial fibromatosis: Secondary | ICD-10-CM | POA: Diagnosis not present

## 2022-04-03 DIAGNOSIS — B351 Tinea unguium: Secondary | ICD-10-CM | POA: Diagnosis not present

## 2022-04-03 MED ORDER — TRIAMCINOLONE ACETONIDE 40 MG/ML IJ SUSP
40.0000 mg | Freq: Once | INTRAMUSCULAR | Status: AC
  Administered 2022-04-03: 40 mg

## 2022-04-06 NOTE — Progress Notes (Signed)
He presents today chief complaint of painfully elongated toenails.  He is also complaining of pain to the right foot as he points to the fourth fifth tarsometatarsal joint area.  Also his Planter fasciitis on the right foot.  Presents today stating that he has been somewhat dizzy.  Objective: Vital signs are stable alert oriented x3.  Pulses are palpable.  Has pain on palpation fourth fifth tarsometatarsal joint also has pain on palpation medial calcaneal tubercle left.  His toenails are long thick yellow dystrophic and likely mycotic.  Assessment: Capsulitis fourth fifth tarsometatarsal joints plan fasciitis and pain in limb secondary to onychomycosis.  Plan: Debridement of toenails 1 through 5 bilateral injected fourth fifth tarsometatarsal phalangeal joints dexamethasone and Planter fasciitis Kenalog.

## 2022-04-07 ENCOUNTER — Ambulatory Visit
Admission: RE | Admit: 2022-04-07 | Discharge: 2022-04-07 | Disposition: A | Discharge status: Home / Self Care | Payer: BC Managed Care – PPO | Source: Ambulatory Visit | Attending: Emergency Medicine | Admitting: Emergency Medicine

## 2022-04-07 ENCOUNTER — Ambulatory Visit
Admission: RE | Admit: 2022-04-07 | Discharge: 2022-04-07 | Disposition: A | Discharge status: Home / Self Care | Payer: BC Managed Care – PPO | Source: Ambulatory Visit | Attending: Allergy & Immunology | Admitting: Allergy & Immunology

## 2022-04-07 DIAGNOSIS — R9389 Abnormal findings on diagnostic imaging of other specified body structures: Secondary | ICD-10-CM

## 2022-04-07 DIAGNOSIS — J310 Chronic rhinitis: Secondary | ICD-10-CM | POA: Diagnosis not present

## 2022-04-07 DIAGNOSIS — R918 Other nonspecific abnormal finding of lung field: Secondary | ICD-10-CM | POA: Diagnosis not present

## 2022-04-07 DIAGNOSIS — R911 Solitary pulmonary nodule: Secondary | ICD-10-CM | POA: Diagnosis not present

## 2022-04-09 ENCOUNTER — Other Ambulatory Visit (HOSPITAL_COMMUNITY): Payer: BC Managed Care – PPO

## 2022-04-09 DIAGNOSIS — K648 Other hemorrhoids: Secondary | ICD-10-CM | POA: Diagnosis not present

## 2022-04-09 DIAGNOSIS — Z86010 Personal history of colonic polyps: Secondary | ICD-10-CM | POA: Diagnosis not present

## 2022-04-09 DIAGNOSIS — K5730 Diverticulosis of large intestine without perforation or abscess without bleeding: Secondary | ICD-10-CM | POA: Diagnosis not present

## 2022-04-11 DIAGNOSIS — G4733 Obstructive sleep apnea (adult) (pediatric): Secondary | ICD-10-CM | POA: Diagnosis not present

## 2022-04-11 DIAGNOSIS — R069 Unspecified abnormalities of breathing: Secondary | ICD-10-CM | POA: Diagnosis not present

## 2022-04-11 DIAGNOSIS — J449 Chronic obstructive pulmonary disease, unspecified: Secondary | ICD-10-CM | POA: Diagnosis not present

## 2022-04-14 ENCOUNTER — Ambulatory Visit: Payer: BC Managed Care – PPO | Admitting: Physical Medicine and Rehabilitation

## 2022-04-14 ENCOUNTER — Ambulatory Visit: Payer: Self-pay

## 2022-04-14 VITALS — BP 123/72 | HR 90

## 2022-04-14 DIAGNOSIS — M47816 Spondylosis without myelopathy or radiculopathy, lumbar region: Secondary | ICD-10-CM | POA: Diagnosis not present

## 2022-04-14 MED ORDER — METHYLPREDNISOLONE ACETATE 80 MG/ML IJ SUSP
40.0000 mg | Freq: Once | INTRAMUSCULAR | Status: AC
  Administered 2022-04-14: 40 mg

## 2022-04-14 NOTE — Progress Notes (Signed)
Radio frequancy L4-5.

## 2022-04-14 NOTE — Patient Instructions (Signed)

## 2022-04-19 ENCOUNTER — Other Ambulatory Visit: Payer: Self-pay | Admitting: Internal Medicine

## 2022-04-19 DIAGNOSIS — S161XXD Strain of muscle, fascia and tendon at neck level, subsequent encounter: Secondary | ICD-10-CM

## 2022-04-21 ENCOUNTER — Ambulatory Visit: Payer: Self-pay

## 2022-04-21 ENCOUNTER — Ambulatory Visit: Payer: BC Managed Care – PPO | Admitting: Physical Medicine and Rehabilitation

## 2022-04-21 DIAGNOSIS — M47816 Spondylosis without myelopathy or radiculopathy, lumbar region: Secondary | ICD-10-CM

## 2022-04-21 MED ORDER — METHYLPREDNISOLONE ACETATE 80 MG/ML IJ SUSP
40.0000 mg | Freq: Once | INTRAMUSCULAR | Status: AC
  Administered 2022-04-21: 40 mg

## 2022-04-21 NOTE — Procedures (Signed)
Lumbar Facet Joint Nerve Denervation  Patient: Mark Harrell.      Date of Birth: 1960-10-05 MRN: [id number] PCP: [name], MD      Visit Date: 04/21/2022   Universal Protocol:    Date/Time: 11/27/238:18 PM  Consent Given By: the patient  Position: PRONE  Additional Comments: Vital signs were monitored before and after the procedure. Patient was prepped and draped in the usual sterile fashion. The correct patient, procedure, and site was verified.   Injection Procedure Details:   Procedure diagnoses:  1. Spondylosis without myelopathy or radiculopathy, lumbar region      Meds Administered:  Meds ordered this encounter  Medications   methylPREDNISolone acetate (DEPO-MEDROL) injection 40 mg     Laterality: Left  Location/Site:  L4-L5, L3 and L4 medial branches and L5-S1, L4 medial branch and L5 dorsal ramus  Needle: 18 ga.,  50m active tip, 1568mRF Cannula  Needle Placement: Along juncture of superior articular process and transverse pocess  Findings:  -Comments:  Procedure Details: For each desired target nerve, the corresponding transverse process (sacral ala for the L5 dorsal rami) was identified and the fluoroscope was positioned to square off the endplates of the corresponding vertebral body to achieve a true AP midline view.  The beam was then obliqued 15 to 20 degrees and caudally tilted 15 to 20 degrees to line up a trajectory along the target nerves. The skin over the target of the junction of superior articulating process and transverse process (sacral ala for the L5 dorsal rami) was infiltrated with 65m43mf 1% Lidocaine without Epinephrine.  The 18 gauge 39m55mtive tip outer cannula was advanced in trajectory view to the target.  This procedure was repeated for each target nerve.  Then, for all levels, the outer cannula placement was fine-tuned and the position was then confirmed with bi-planar imaging.    Test stimulation was done both at sensory  and motor levels to ensure there was no radicular stimulation. The target tissues were then infiltrated with 1 ml of 1% Lidocaine without Epinephrine. Subsequently, a percutaneous neurotomy was carried out for 90 seconds at 80 degrees Celsius.  After the completion of the lesion, 1 ml of injectate was delivered. It was then repeated for each facet joint nerve mentioned above. Appropriate radiographs were obtained to verify the probe placement during the neurotomy.   Additional Comments:  No complications occurred Dressing: 2 x 2 sterile gauze and Band-Aid    Post-procedure details: Patient was observed during the procedure. Post-procedure instructions were reviewed.  Patient left the clinic in stable condition.

## 2022-04-21 NOTE — Progress Notes (Signed)
Mark Harrell. - 62 y.o. male MRN [id number]  Date of birth: 06/17/1960  Office Visit Note: Visit Date: 04/21/2022 PCP: [name], MD Referred by: Lorine Bears, NP  Subjective: Chief Complaint  Patient presents with   Lower Back - Pain   HPI:  Mark Calame. is a 61 y.o. male who comes in todayfor planned radiofrequency ablation of the Left L4-5 and L5-S1 Lumbar facet joints. This would be ablation of the corresponding medial branches and/or dorsal rami.  Patient has had double diagnostic blocks with more than 50% relief.  These are documented on pain diary.  They have had chronic back pain for quite some time, more than 3 months, which has been an ongoing situation with recalcitrant axial back pain.  They have no radicular pain.  Their axial pain is worse with standing and ambulating and on exam today with facet loading.  They have had physical therapy as well as home exercise program.  The imaging noted in the chart below indicated facet pathology. Accordingly they meet all the criteria and qualification for for radiofrequency ablation and we are going to complete this today hopefully for more longer term relief as part of comprehensive management program.  Patient asking today about other types of injections for other areas of his pain.  I did suggest that give this latest procedure a few weeks that he can call us for an office evaluation so we can look at what is the neck step may be in some pain control for him.  He has been followed by Dr. [name] who feels like he needs knee replacement and he has lost a lot of weight.  He has had injections in the past and his knees.  Again we will see him in follow-up when he calls [place] back.   ROS Otherwise per HPI.  Assessment & Plan: Visit Diagnoses:    ICD-10-CM   1. Spondylosis without myelopathy or radiculopathy, lumbar region  M47.816 XR C-ARM NO REPORT    Radiofrequency,Lumbar    methylPREDNISolone acetate (DEPO-MEDROL)  injection 40 mg      Plan: No additional findings.   Meds & Orders:  Meds ordered this encounter  Medications   methylPREDNISolone acetate (DEPO-MEDROL) injection 40 mg    Orders Placed This Encounter  Procedures   Radiofrequency,Lumbar   XR C-ARM NO REPORT    Follow-up: Return for visit to requesting provider as needed.   Procedures: No procedures performed  Lumbar Facet Joint Nerve Denervation  Patient: Mark Harrell.      Date of Birth: 25-Mar-1961 MRN: [id number] PCP: [name], MD      Visit Date: 04/21/2022   Universal Protocol:    Date/Time: 11/27/238:18 PM  Consent Given By: the patient  Position: PRONE  Additional Comments: Vital signs were monitored before and after the procedure. Patient was prepped and draped in the usual sterile fashion. The correct patient, procedure, and site was verified.   Injection Procedure Details:   Procedure diagnoses:  1. Spondylosis without myelopathy or radiculopathy, lumbar region      Meds Administered:  Meds ordered this encounter  Medications   methylPREDNISolone acetate (DEPO-MEDROL) injection 40 mg     Laterality: Left  Location/Site:  L4-L5, L3 and L4 medial branches and L5-S1, L4 medial branch and L5 dorsal ramus  Needle: 18 ga.,  36m active tip, 1561mRF Cannula  Needle Placement: Along juncture of superior articular process and transverse pocess  Findings:  -Comments:  Procedure  Details: For each desired target nerve, the corresponding transverse process (sacral ala for the L5 dorsal rami) was identified and the fluoroscope was positioned to square off the endplates of the corresponding vertebral body to achieve a true AP midline view.  The beam was then obliqued 15 to 20 degrees and caudally tilted 15 to 20 degrees to line up a trajectory along the target nerves. The skin over the target of the junction of superior articulating process and transverse process (sacral ala for the L5 dorsal rami)  was infiltrated with 39m of 1% Lidocaine without Epinephrine.  The 18 gauge 110mactive tip outer cannula was advanced in trajectory view to the target.  This procedure was repeated for each target nerve.  Then, for all levels, the outer cannula placement was fine-tuned and the position was then confirmed with bi-planar imaging.    Test stimulation was done both at sensory and motor levels to ensure there was no radicular stimulation. The target tissues were then infiltrated with 1 ml of 1% Lidocaine without Epinephrine. Subsequently, a percutaneous neurotomy was carried out for 90 seconds at 80 degrees Celsius.  After the completion of the lesion, 1 ml of injectate was delivered. It was then repeated for each facet joint nerve mentioned above. Appropriate radiographs were obtained to verify the probe placement during the neurotomy.   Additional Comments:  No complications occurred Dressing: 2 x 2 sterile gauze and Band-Aid    Post-procedure details: Patient was observed during the procedure. Post-procedure instructions were reviewed.  Patient left the clinic in stable condition.      Clinical History: No specialty comments available.     Objective:  VS:  HT:    WT:   BMI:     BP:   HR: bpm  TEMP: ( )  RESP:  Physical Exam Vitals and nursing note reviewed.  Constitutional:      General: He is not in acute distress.    Appearance: Normal appearance. He is obese. He is not ill-appearing.  HENT:     Head: Normocephalic and atraumatic.     Right Ear: External ear normal.     Left Ear: External ear normal.     Nose: No congestion.  Eyes:     Extraocular Movements: Extraocular movements intact.  Cardiovascular:     Rate and Rhythm: Normal rate.     Pulses: Normal pulses.  Pulmonary:     Effort: Pulmonary effort is normal. No respiratory distress.  Abdominal:     General: There is no distension.     Palpations: Abdomen is soft.  Musculoskeletal:        General: No  tenderness or signs of injury.     Cervical back: Neck supple.     Right lower leg: No edema.     Left lower leg: No edema.     Comments: Patient has good distal strength without clonus.  Skin:    Findings: No erythema or rash.  Neurological:     General: No focal deficit present.     Mental Status: He is alert and oriented to person, place, and time.     Sensory: No sensory deficit.     Motor: No weakness or abnormal muscle tone.     Coordination: Coordination normal.  Psychiatric:        Mood and Affect: Mood normal.        Behavior: Behavior normal.      Imaging: XR C-ARM NO REPORT  Result Date: 04/21/2022 Please see  Notes tab for imaging impression.

## 2022-04-21 NOTE — Procedures (Signed)
Lumbar Facet Joint Nerve Denervation  Patient: Mark Harrell.      Date of Birth: April 28, 1961 MRN: [id number] PCP: [name], MD      Visit Date: 04/14/2022   Universal Protocol:    Date/Time: 11/27/238:16 PM  Consent Given By: the patient  Position: PRONE  Additional Comments: Vital signs were monitored before and after the procedure. Patient was prepped and draped in the usual sterile fashion. The correct patient, procedure, and site was verified.   Injection Procedure Details:   Procedure diagnoses:  1. Spondylosis without myelopathy or radiculopathy, lumbar region      Meds Administered:  Meds ordered this encounter  Medications   methylPREDNISolone acetate (DEPO-MEDROL) injection 40 mg     Laterality: Right  Location/Site:  L4-L5, L3 and L4 medial branches and L5-S1, L4 medial branch and L5 dorsal ramus  Needle: 18 ga.,  28m active tip, 1575mRF Cannula  Needle Placement: Along juncture of superior articular process and transverse pocess  Findings:  -Comments:  Procedure Details: For each desired target nerve, the corresponding transverse process (sacral ala for the L5 dorsal rami) was identified and the fluoroscope was positioned to square off the endplates of the corresponding vertebral body to achieve a true AP midline view.  The beam was then obliqued 15 to 20 degrees and caudally tilted 15 to 20 degrees to line up a trajectory along the target nerves. The skin over the target of the junction of superior articulating process and transverse process (sacral ala for the L5 dorsal rami) was infiltrated with 43m6mf 1% Lidocaine without Epinephrine.  The 18 gauge 56m17mtive tip outer cannula was advanced in trajectory view to the target.  This procedure was repeated for each target nerve.  Then, for all levels, the outer cannula placement was fine-tuned and the position was then confirmed with bi-planar imaging.    Test stimulation was done both at sensory  and motor levels to ensure there was no radicular stimulation. The target tissues were then infiltrated with 1 ml of 1% Lidocaine without Epinephrine. Subsequently, a percutaneous neurotomy was carried out for 90 seconds at 80 degrees Celsius.  After the completion of the lesion, 1 ml of injectate was delivered. It was then repeated for each facet joint nerve mentioned above. Appropriate radiographs were obtained to verify the probe placement during the neurotomy.   Additional Comments:  No complications occurred Dressing: 2 x 2 sterile gauze and Band-Aid    Post-procedure details: Patient was observed during the procedure. Post-procedure instructions were reviewed.  Patient left the clinic in stable condition.

## 2022-04-21 NOTE — Progress Notes (Signed)
Mark Harrell. - 61 y.o. male MRN [id number]  Date of birth: 1961-04-07  Office Visit Note: Visit Date: 04/14/2022 PCP: [name], MD Referred by: Lorine Bears, NP  Subjective: Chief Complaint  Patient presents with   Lower Back - Pain   HPI:  Mark Elk. is a 61 y.o. male who comes in todayfor planned radiofrequency ablation of the Right L4-5 and L5-S1 Lumbar facet joints. This would be ablation of the corresponding medial branches and/or dorsal rami.  Patient has had double diagnostic blocks with more than 50% relief.  These are documented on pain diary.  They have had chronic back pain for quite some time, more than 3 months, which has been an ongoing situation with recalcitrant axial back pain.  They have no radicular pain.  Their axial pain is worse with standing and ambulating and on exam today with facet loading.  They have had physical therapy as well as home exercise program.  The imaging noted in the chart below indicated facet pathology. Accordingly they meet all the criteria and qualification for for radiofrequency ablation and we are going to complete this today hopefully for more longer term relief as part of comprehensive management program.   ROS Otherwise per HPI.  Assessment & Plan: Visit Diagnoses:    ICD-10-CM   1. Spondylosis without myelopathy or radiculopathy, lumbar region  M47.816 XR C-ARM NO REPORT    Radiofrequency,Lumbar    methylPREDNISolone acetate (DEPO-MEDROL) injection 40 mg      Plan: No additional findings.   Meds & Orders:  Meds ordered this encounter  Medications   methylPREDNISolone acetate (DEPO-MEDROL) injection 40 mg    Orders Placed This Encounter  Procedures   Radiofrequency,Lumbar   XR C-ARM NO REPORT    Follow-up: Return if symptoms worsen or fail to improve.   Procedures: No procedures performed  Lumbar Facet Joint Nerve Denervation  Patient: Mark Harrell.      Date of Birth: 12-06-1960 MRN:  [id number] PCP: [name], MD      Visit Date: 04/14/2022   Universal Protocol:    Date/Time: 11/27/238:16 PM  Consent Given By: the patient  Position: PRONE  Additional Comments: Vital signs were monitored before and after the procedure. Patient was prepped and draped in the usual sterile fashion. The correct patient, procedure, and site was verified.   Injection Procedure Details:   Procedure diagnoses:  1. Spondylosis without myelopathy or radiculopathy, lumbar region      Meds Administered:  Meds ordered this encounter  Medications   methylPREDNISolone acetate (DEPO-MEDROL) injection 40 mg     Laterality: Right  Location/Site:  L4-L5, L3 and L4 medial branches and L5-S1, L4 medial branch and L5 dorsal ramus  Needle: 18 ga.,  22m active tip, 1585mRF Cannula  Needle Placement: Along juncture of superior articular process and transverse pocess  Findings:  -Comments:  Procedure Details: For each desired target nerve, the corresponding transverse process (sacral ala for the L5 dorsal rami) was identified and the fluoroscope was positioned to square off the endplates of the corresponding vertebral body to achieve a true AP midline view.  The beam was then obliqued 15 to 20 degrees and caudally tilted 15 to 20 degrees to line up a trajectory along the target nerves. The skin over the target of the junction of superior articulating process and transverse process (sacral ala for the L5 dorsal rami) was infiltrated with 44m42mf 1% Lidocaine without Epinephrine.  The 18  gauge 50m active tip outer cannula was advanced in trajectory view to the target.  This procedure was repeated for each target nerve.  Then, for all levels, the outer cannula placement was fine-tuned and the position was then confirmed with bi-planar imaging.    Test stimulation was done both at sensory and motor levels to ensure there was no radicular stimulation. The target tissues were then infiltrated  with 1 ml of 1% Lidocaine without Epinephrine. Subsequently, a percutaneous neurotomy was carried out for 90 seconds at 80 degrees Celsius.  After the completion of the lesion, 1 ml of injectate was delivered. It was then repeated for each facet joint nerve mentioned above. Appropriate radiographs were obtained to verify the probe placement during the neurotomy.   Additional Comments:  No complications occurred Dressing: 2 x 2 sterile gauze and Band-Aid    Post-procedure details: Patient was observed during the procedure. Post-procedure instructions were reviewed.  Patient left the clinic in stable condition.      Clinical History: No specialty comments available.     Objective:  VS:  HT:    WT:   BMI:     BP:123/72  HR:90bpm  TEMP: ( )  RESP:  Physical Exam Vitals and nursing note reviewed.  Constitutional:      General: He is not in acute distress.    Appearance: Normal appearance. He is obese. He is not ill-appearing.  HENT:     Head: Normocephalic and atraumatic.     Right Ear: External ear normal.     Left Ear: External ear normal.     Nose: No congestion.  Eyes:     Extraocular Movements: Extraocular movements intact.  Cardiovascular:     Rate and Rhythm: Normal rate.     Pulses: Normal pulses.  Pulmonary:     Effort: Pulmonary effort is normal. No respiratory distress.  Abdominal:     General: There is no distension.     Palpations: Abdomen is soft.  Musculoskeletal:        General: No tenderness or signs of injury.     Cervical back: Neck supple.     Right lower leg: No edema.     Left lower leg: No edema.     Comments: Patient has good distal strength without clonus.  Skin:    Findings: No erythema or rash.  Neurological:     General: No focal deficit present.     Mental Status: He is alert and oriented to person, place, and time.     Sensory: No sensory deficit.     Motor: No weakness or abnormal muscle tone.     Coordination: Coordination  normal.  Psychiatric:        Mood and Affect: Mood normal.        Behavior: Behavior normal.      Imaging: XR C-ARM NO REPORT  Result Date: 04/21/2022 Please see Notes tab for imaging impression.

## 2022-04-21 NOTE — Progress Notes (Signed)
Numeric Pain Rating Scale and Functional Assessment Average Pain 6   In the last MONTH (on 0-10 scale) has pain interfered with the following?  1. General activity like being  able to carry out your everyday physical activities such as walking, climbing stairs, carrying groceries, or moving a chair?  Rating(6) Lower back pain , no radiculopathy.  Hard to tell if right sided ablation helped. But better today.  +Driver, -BT, -Dye Allergies.

## 2022-04-25 ENCOUNTER — Other Ambulatory Visit: Payer: Self-pay

## 2022-04-25 ENCOUNTER — Encounter (HOSPITAL_COMMUNITY): Payer: Self-pay | Admitting: Emergency Medicine

## 2022-04-25 NOTE — Progress Notes (Addendum)
Spoke with pt for pre-op call. Pt denies cardiac history, but is treated for COPD. Pt states he has trouble waking up after surgery and usually has to be admitted a night or two after surgery. He states he has told this to Dr. Lamonte Sakai. He states the times he was sent home after surgery, he came back with respiratory failure. He wants to make sure the anesthesiologists are aware of this. Pt is pre-diabetic. He is on Metformin and Ozempic. He states he normally takes is Ozempic on Thursday, but did not take it yesterday. I told him he did the correct thing. He will hold his Metformin the day of the procedure. His last A1C was 5.3 on 02/08/22.  Shower instructions given to pt and he voiced understanding.   Chart sent to Anesthesia PA

## 2022-04-28 ENCOUNTER — Ambulatory Visit (HOSPITAL_COMMUNITY): Payer: BC Managed Care – PPO | Admitting: Anesthesiology

## 2022-04-28 ENCOUNTER — Encounter (HOSPITAL_COMMUNITY): Payer: Self-pay | Admitting: Emergency Medicine

## 2022-04-28 ENCOUNTER — Other Ambulatory Visit: Payer: Self-pay | Admitting: Allergy & Immunology

## 2022-04-28 ENCOUNTER — Ambulatory Visit (HOSPITAL_COMMUNITY): Payer: BC Managed Care – PPO

## 2022-04-28 ENCOUNTER — Encounter (HOSPITAL_COMMUNITY)
Admission: RE | Disposition: A | Discharge status: Home / Self Care | Payer: Self-pay | Source: Ambulatory Visit | Attending: Emergency Medicine

## 2022-04-28 ENCOUNTER — Other Ambulatory Visit: Payer: Self-pay

## 2022-04-28 ENCOUNTER — Ambulatory Visit (HOSPITAL_COMMUNITY)
Admission: RE | Admit: 2022-04-28 | Discharge: 2022-04-28 | Disposition: A | Discharge status: Home / Self Care | Payer: BC Managed Care – PPO | Source: Ambulatory Visit | Attending: Emergency Medicine | Admitting: Emergency Medicine

## 2022-04-28 DIAGNOSIS — Z9889 Other specified postprocedural states: Secondary | ICD-10-CM | POA: Diagnosis not present

## 2022-04-28 DIAGNOSIS — R918 Other nonspecific abnormal finding of lung field: Secondary | ICD-10-CM | POA: Diagnosis not present

## 2022-04-28 DIAGNOSIS — Z1152 Encounter for screening for COVID-19: Secondary | ICD-10-CM | POA: Insufficient documentation

## 2022-04-28 DIAGNOSIS — E119 Type 2 diabetes mellitus without complications: Secondary | ICD-10-CM | POA: Insufficient documentation

## 2022-04-28 DIAGNOSIS — M1990 Unspecified osteoarthritis, unspecified site: Secondary | ICD-10-CM | POA: Diagnosis not present

## 2022-04-28 DIAGNOSIS — I517 Cardiomegaly: Secondary | ICD-10-CM | POA: Diagnosis not present

## 2022-04-28 DIAGNOSIS — Z79899 Other long term (current) drug therapy: Secondary | ICD-10-CM | POA: Diagnosis not present

## 2022-04-28 DIAGNOSIS — K219 Gastro-esophageal reflux disease without esophagitis: Secondary | ICD-10-CM | POA: Diagnosis not present

## 2022-04-28 DIAGNOSIS — Z7984 Long term (current) use of oral hypoglycemic drugs: Secondary | ICD-10-CM | POA: Insufficient documentation

## 2022-04-28 DIAGNOSIS — F419 Anxiety disorder, unspecified: Secondary | ICD-10-CM | POA: Diagnosis not present

## 2022-04-28 DIAGNOSIS — Z87891 Personal history of nicotine dependence: Secondary | ICD-10-CM | POA: Diagnosis not present

## 2022-04-28 DIAGNOSIS — I10 Essential (primary) hypertension: Secondary | ICD-10-CM | POA: Diagnosis not present

## 2022-04-28 DIAGNOSIS — R059 Cough, unspecified: Secondary | ICD-10-CM

## 2022-04-28 DIAGNOSIS — J449 Chronic obstructive pulmonary disease, unspecified: Secondary | ICD-10-CM | POA: Insufficient documentation

## 2022-04-28 DIAGNOSIS — S161XXD Strain of muscle, fascia and tendon at neck level, subsequent encounter: Secondary | ICD-10-CM | POA: Diagnosis not present

## 2022-04-28 DIAGNOSIS — R9389 Abnormal findings on diagnostic imaging of other specified body structures: Secondary | ICD-10-CM | POA: Diagnosis present

## 2022-04-28 DIAGNOSIS — Z6841 Body Mass Index (BMI) 40.0 and over, adult: Secondary | ICD-10-CM | POA: Insufficient documentation

## 2022-04-28 DIAGNOSIS — I82409 Acute embolism and thrombosis of unspecified deep veins of unspecified lower extremity: Secondary | ICD-10-CM | POA: Diagnosis not present

## 2022-04-28 DIAGNOSIS — G4733 Obstructive sleep apnea (adult) (pediatric): Secondary | ICD-10-CM | POA: Diagnosis not present

## 2022-04-28 DIAGNOSIS — F3341 Major depressive disorder, recurrent, in partial remission: Secondary | ICD-10-CM | POA: Diagnosis not present

## 2022-04-28 DIAGNOSIS — G709 Myoneural disorder, unspecified: Secondary | ICD-10-CM | POA: Insufficient documentation

## 2022-04-28 DIAGNOSIS — Z9981 Dependence on supplemental oxygen: Secondary | ICD-10-CM | POA: Insufficient documentation

## 2022-04-28 DIAGNOSIS — F32A Depression, unspecified: Secondary | ICD-10-CM | POA: Diagnosis not present

## 2022-04-28 DIAGNOSIS — R911 Solitary pulmonary nodule: Secondary | ICD-10-CM | POA: Diagnosis not present

## 2022-04-28 HISTORY — PX: BRONCHIAL NEEDLE ASPIRATION BIOPSY: SHX5106

## 2022-04-28 HISTORY — DX: COVID-19: U07.1

## 2022-04-28 HISTORY — DX: Chronic obstructive pulmonary disease, unspecified: J44.9

## 2022-04-28 HISTORY — PX: BRONCHIAL BRUSHINGS: SHX5108

## 2022-04-28 HISTORY — PX: VIDEO BRONCHOSCOPY WITH RADIAL ENDOBRONCHIAL ULTRASOUND: SHX6849

## 2022-04-28 LAB — CBC
HCT: 45.1 % (ref 39.0–52.0)
Hemoglobin: 14.9 g/dL (ref 13.0–17.0)
MCH: 30 pg (ref 26.0–34.0)
MCHC: 33 g/dL (ref 30.0–36.0)
MCV: 90.7 fL (ref 80.0–100.0)
Platelets: 255 10*3/uL (ref 150–400)
RBC: 4.97 MIL/uL (ref 4.22–5.81)
RDW: 13.9 % (ref 11.5–15.5)
WBC: 13.1 10*3/uL — ABNORMAL HIGH (ref 4.0–10.5)
nRBC: 0 % (ref 0.0–0.2)

## 2022-04-28 LAB — BASIC METABOLIC PANEL
Anion gap: 11 (ref 5–15)
BUN: 25 mg/dL — ABNORMAL HIGH (ref 8–23)
CO2: 25 mmol/L (ref 22–32)
Calcium: 9.6 mg/dL (ref 8.9–10.3)
Chloride: 101 mmol/L (ref 98–111)
Creatinine, Ser: 0.74 mg/dL (ref 0.61–1.24)
GFR, Estimated: >60 mL/min (ref >60)
Glucose, Bld: 88 mg/dL (ref 70–99)
Potassium: 4.4 mmol/L (ref 3.5–5.1)
Sodium: 137 mmol/L (ref 135–145)

## 2022-04-28 LAB — SARS CORONAVIRUS 2 BY RT PCR: SARS Coronavirus 2 by RT PCR: NEGATIVE

## 2022-04-28 SURGERY — BRONCHOSCOPY, WITH BIOPSY USING ELECTROMAGNETIC NAVIGATION
Anesthesia: General

## 2022-04-28 MED ORDER — ROCURONIUM BROMIDE 10 MG/ML (PF) SYRINGE
PREFILLED_SYRINGE | INTRAVENOUS | Status: DC | PRN
  Administered 2022-04-28: 30 mg via INTRAVENOUS
  Administered 2022-04-28: 40 mg via INTRAVENOUS

## 2022-04-28 MED ORDER — PROPOFOL 10 MG/ML IV BOLUS
INTRAVENOUS | Status: DC | PRN
  Administered 2022-04-28: 50 mg via INTRAVENOUS
  Administered 2022-04-28: 200 mg via INTRAVENOUS

## 2022-04-28 MED ORDER — EPINEPHRINE 0.3 MG/0.3ML IJ SOAJ: 0.3000 mg | INTRAMUSCULAR | Status: DC | PRN

## 2022-04-28 MED ORDER — FENTANYL CITRATE (PF) 250 MCG/5ML IJ SOLN
INTRAMUSCULAR | Status: DC | PRN
  Administered 2022-04-28 (×2): 50 ug via INTRAVENOUS

## 2022-04-28 MED ORDER — SUCCINYLCHOLINE CHLORIDE 200 MG/10ML IV SOSY
PREFILLED_SYRINGE | INTRAVENOUS | Status: DC | PRN
  Administered 2022-04-28: 140 mg via INTRAVENOUS

## 2022-04-28 MED ORDER — IPRATROPIUM-ALBUTEROL 0.5-2.5 (3) MG/3ML IN SOLN: 3.0000 mL | Freq: Two times a day (BID) | RESPIRATORY_TRACT | Status: DC

## 2022-04-28 MED ORDER — DICLOFENAC SODIUM 1 % EX GEL: 2.0000 g | Freq: Two times a day (BID) | CUTANEOUS | Status: AC | PRN

## 2022-04-28 MED ORDER — PHENYLEPHRINE HCL-NACL 20-0.9 MG/250ML-% IV SOLN
INTRAVENOUS | Status: DC | PRN
  Administered 2022-04-28: 30 ug/min via INTRAVENOUS

## 2022-04-28 MED ORDER — POTASSIUM CHLORIDE CRYS ER 20 MEQ PO TBCR: 20.0000 meq | EXTENDED_RELEASE_TABLET | Freq: Two times a day (BID) | ORAL | Status: DC

## 2022-04-28 MED ORDER — CETAPHIL MOISTURIZING EX LOTN: 1.0000 | TOPICAL_LOTION | Freq: Two times a day (BID) | CUTANEOUS | Status: AC

## 2022-04-28 MED ORDER — DEXAMETHASONE SODIUM PHOSPHATE 10 MG/ML IJ SOLN
INTRAMUSCULAR | Status: DC | PRN
  Administered 2022-04-28: 10 mg via INTRAVENOUS

## 2022-04-28 MED ORDER — SUGAMMADEX SODIUM 200 MG/2ML IV SOLN
INTRAVENOUS | Status: DC | PRN
  Administered 2022-04-28: 200 mg via INTRAVENOUS

## 2022-04-28 MED ORDER — ACETAMINOPHEN 500 MG PO TABS: 1000.0000 mg | ORAL_TABLET | Freq: Once | ORAL | Status: DC

## 2022-04-28 MED ORDER — FENTANYL CITRATE (PF) 100 MCG/2ML IJ SOLN: 25.0000 ug | INTRAMUSCULAR | Status: DC | PRN

## 2022-04-28 MED ORDER — ONDANSETRON HCL 4 MG/2ML IJ SOLN: 4.0000 mg | Freq: Once | INTRAMUSCULAR | Status: DC | PRN

## 2022-04-28 MED ORDER — PHENYLEPHRINE 80 MCG/ML (10ML) SYRINGE FOR IV PUSH (FOR BLOOD PRESSURE SUPPORT)
PREFILLED_SYRINGE | INTRAVENOUS | Status: DC | PRN
  Administered 2022-04-28: 80 ug via INTRAVENOUS
  Administered 2022-04-28: 160 ug via INTRAVENOUS

## 2022-04-28 MED ORDER — LIDOCAINE 2% (20 MG/ML) 5 ML SYRINGE
INTRAMUSCULAR | Status: DC | PRN
  Administered 2022-04-28: 100 mg via INTRAVENOUS

## 2022-04-28 MED ORDER — CHLORHEXIDINE GLUCONATE 0.12 % MT SOLN
OROMUCOSAL | Status: AC
  Filled 2022-04-28: qty 15

## 2022-04-28 MED ORDER — CHLORHEXIDINE GLUCONATE 0.12 % MT SOLN
15.0000 mL | Freq: Once | OROMUCOSAL | Status: AC
  Administered 2022-04-28: 15 mL via OROMUCOSAL
  Filled 2022-04-28: qty 15

## 2022-04-28 MED ORDER — CETIRIZINE HCL 10 MG PO TABS: 10.0000 mg | ORAL_TABLET | Freq: Every morning | ORAL | Status: DC

## 2022-04-28 MED ORDER — FLUTICASONE PROPIONATE 50 MCG/ACT NA SUSP: 2.0000 | Freq: Two times a day (BID) | NASAL | Status: DC

## 2022-04-28 MED ORDER — PROPOFOL 500 MG/50ML IV EMUL
INTRAVENOUS | Status: DC | PRN
  Administered 2022-04-28: 100 ug/kg/min via INTRAVENOUS

## 2022-04-28 MED ORDER — AZELASTINE HCL 137 MCG/SPRAY NA SOLN: 2.0000 | Freq: Two times a day (BID) | NASAL | Status: DC

## 2022-04-28 MED ORDER — ONDANSETRON HCL 4 MG/2ML IJ SOLN
INTRAMUSCULAR | Status: DC | PRN
  Administered 2022-04-28: 4 mg via INTRAVENOUS

## 2022-04-28 MED ORDER — CYCLOBENZAPRINE HCL 10 MG PO TABS: 10.0000 mg | ORAL_TABLET | Freq: Three times a day (TID) | ORAL | Status: DC

## 2022-04-28 MED ORDER — SERTRALINE HCL 100 MG PO TABS: 100.0000 mg | ORAL_TABLET | Freq: Two times a day (BID) | ORAL | Status: DC

## 2022-04-28 MED ORDER — PROMETHAZINE-DM 6.25-15 MG/5ML PO SYRP: 5.0000 mL | ORAL_SOLUTION | Freq: Four times a day (QID) | ORAL | Status: DC | PRN

## 2022-04-28 MED ORDER — BUDESONIDE 0.5 MG/2ML IN SUSP: 0.5000 mg | Freq: Two times a day (BID) | RESPIRATORY_TRACT | Status: DC

## 2022-04-28 MED ORDER — LACTATED RINGERS IV SOLN: INTRAVENOUS | Status: DC

## 2022-04-28 NOTE — Anesthesia Procedure Notes (Signed)
Procedure Name: Intubation Date/Time: 04/28/2022 9:15 AM  Performed by: Janene Harvey, CRNAPre-anesthesia Checklist: Patient identified, Emergency Drugs available, Suction available and Patient being monitored Patient Re-evaluated:Patient Re-evaluated prior to induction Oxygen Delivery Method: Circle system utilized Preoxygenation: Pre-oxygenation with 100% oxygen Induction Type: IV induction and Rapid sequence Laryngoscope Size: Glidescope and 4 Grade View: Grade I Tube type: Oral Tube size: 8.5 mm Number of attempts: 1 Airway Equipment and Method: Stylet and Oral airway Placement Confirmation: ETT inserted through vocal cords under direct vision, positive ETCO2 and breath sounds checked- equal and bilateral Secured at: 23 cm Tube secured with: Tape Dental Injury: Teeth and Oropharynx as per pre-operative assessment

## 2022-04-28 NOTE — Op Note (Signed)
Video Bronchoscopy with Robotic Assisted Bronchoscopic Navigation   Date of Operation: 04/28/2022   Pre-op Diagnosis: Pulmonary nodules  Post-op Diagnosis: Same  Surgeon: Lamonte Sakai  Assistants: None  Anesthesia: General endotracheal anesthesia  Operation: Flexible video fiberoptic bronchoscopy with robotic assistance and biopsies.  Estimated Blood Loss: Minimal  Complications: None  Indications and History: Mark Harrell. is a 61 y.o. male with history of asthma, OSA not on CPAP.  He is bilateral pulmonary nodules, noted on CT, so possible adjacent hypermetabolism.  Recommendation made to achieve tissue diagnosis and obtain culture information via navigational bronchoscopy. The risks, benefits, complications, treatment options and expected outcomes were discussed with the patient.  The possibilities of pneumothorax, pneumonia, reaction to medication, pulmonary aspiration, perforation of a viscus, bleeding, failure to diagnose a condition and creating a complication requiring transfusion or operation were discussed with the patient who freely signed the consent.    Description of Procedure: The patient was seen in the Preoperative Area, was examined and was deemed appropriate to proceed.  The patient was taken to [facility] endoscopy room 3 for, identified as Mark Harrell. and the procedure verified as Flexible Video Fiberoptic Bronchoscopy.  A Time Out was held and the above information confirmed.  The procedure was brought in the left lateral decubitus position given the location of his bilateral lower lobe nodules in the very inferior position.  This was done to avoid atelectasis.  Decision was made to approach the right-sided nodules since these were in the distinct locations.  The left lower lobe nodule was not approached as there was atelectasis in the left lateral position.  Prior to the date of the procedure a high-resolution CT scan of the chest was performed. Utilizing ION software program  a virtual tracheobronchial tree was generated to allow the creation of distinct navigation pathways to the patient's parenchymal abnormalities. After being taken to the operating room general anesthesia was initiated and the patient  was orally intubated. The video fiberoptic bronchoscope was introduced via the endotracheal tube and a general inspection was performed which showed normal right and left lung anatomy. Aspiration of the bilateral mainstems was completed to remove any remaining secretions. Robotic catheter inserted into patient's endotracheal tube.   Target #1 right lower lobe medial basilar pulmonary nodule: The distinct navigation pathways prepared prior to this procedure were then utilized to navigate to patient's lesion identified on CT scan.  A raised hyperpigmented endobronchial lesion was noted to correspond with the nodule. The robotic catheter was secured into place and the vision probe was withdrawn.  Lesion location was approximated using fluoroscopy and radial endobronchial ultrasound for peripheral targeting.  Local registration and targeting was performed using [employer] three-dimensional imaging.  Under fluoroscopic guidance transbronchial needle brushings, transbronchial needle biopsies, and transbronchial forceps biopsies were performed to be sent for cytology and pathology.   Target #2 right lower lobe medial superior nodular cluster: The distinct navigation pathways prepared prior to this procedure were then utilized to navigate to patient's lesion identified on CT scan. The robotic catheter was secured into place and the vision probe was withdrawn.  Lesion location was approximated using fluoroscopy and radial endobronchial ultrasound for peripheral targeting. Under fluoroscopic guidance transbronchial brushings were performed to be sent for cytology. A bronchioalveolar lavage was performed in the right lower lobe adjacent to target 2 and sent for cytology and microbiology.  At the  end of the procedure a general airway inspection was performed and there was no evidence of active bleeding.  The bronchoscope was removed.  The patient tolerated the procedure well. There was no significant blood loss and there were no obvious complications. A post-procedural chest x-ray is pending.  Samples Target #1: 1. Transbronchial needle brushings from right lower lobe nodule 2. Transbronchial Wang needle biopsies from right lower lobe nodule 3. Transbronchial forceps biopsies from right lower lobe nodule  Samples Target #2: 1. Transbronchial brushings from lower lobe (more superior) nodular cluster 2. Bronchoalveolar lavage from right lower lobe adjacent to target 2   Plans:  The patient will be discharged from the PACU to home when recovered from anesthesia and after chest x-ray is reviewed. We will review the cytology, pathology and microbiology results with the patient when they become available. Outpatient followup will be with Dr Lamonte Sakai.   Baltazar Apo, MD, PhD 04/28/2022, 10:22 AM [facility] [phone] or if no answer before 7:00PM call [phone] For any issues after 7:00PM please call eLink [phone]

## 2022-04-28 NOTE — Discharge Instructions (Signed)
Flexible Bronchoscopy, Care After This sheet gives you information about how to care for yourself after your test. Your doctor may also give you more specific instructions. If you have problems or questions, contact your doctor. Follow these instructions at home: Eating and drinking When your numbness is gone and your cough and gag reflexes have come back, you may: Eat only soft foods. Slowly drink liquids. The day after the test, go back to your normal diet. Driving Do not drive for 24 hours if you were given a medicine to help you relax (sedative). Do not drive or use heavy machinery while taking prescription pain medicine. General instructions  Take over-the-counter and prescription medicines only as told by your doctor. Return to your normal activities as told. Ask what activities are safe for you. Do not use any products that have nicotine or tobacco in them. This includes cigarettes and e-cigarettes. If you need help quitting, ask your doctor. Keep all follow-up visits as told by your doctor. This is important. It is very important if you had a tissue sample (biopsy) taken. Get help right away if: You have shortness of breath that gets worse. You get light-headed. You feel like you are going to pass out (faint). You have chest pain. You cough up: More than a little blood. More blood than before. Summary Do not eat or drink anything (not even water) for 2 hours after your test, or until your numbing medicine wears off. Do not use cigarettes. Do not use e-cigarettes. Get help right away if you have chest pain.  Please call our office for any questions or concerns.  [phone].  This information is not intended to replace advice given to you by your health care provider. Make sure you discuss any questions you have with your health care provider. Document Released: 03/09/2009 Document Revised: 04/24/2017 Document Reviewed: 05/30/2016 Elsevier Patient Education  2020 Elsevier  Inc.  

## 2022-04-28 NOTE — Transfer of Care (Signed)
Immediate Anesthesia Transfer of Care Note  Patient: Mark Harrell.  Procedure(s) Performed: ROBOTIC ASSISTED NAVIGATIONAL BRONCHOSCOPY VIDEO BRONCHOSCOPY WITH RADIAL ENDOBRONCHIAL ULTRASOUND BRONCHIAL NEEDLE ASPIRATION BIOPSIES BRONCHIAL BRUSHINGS  Patient Location: PACU  Anesthesia Type:General  Level of Consciousness: sedated  Airway & Oxygen Therapy: Patient Spontanous Breathing and Patient connected to face mask oxygen  Post-op Assessment: Report given to RN and Post -op Vital signs reviewed and stable  Post vital signs: Reviewed and stable  Last Vitals:  Vitals Value Taken Time  BP 73/43 04/28/22 1030  Temp    Pulse 78 04/28/22 1030  Resp 11 04/28/22 1032  SpO2 99 % 04/28/22 1030  Vitals shown include unvalidated device data.  Last Pain:  Vitals:   04/28/22 0721  TempSrc:   PainSc: 6          Complications: No notable events documented.

## 2022-04-28 NOTE — Anesthesia Postprocedure Evaluation (Signed)
Anesthesia Post Note  Patient: Mark Harrell.  Procedure(s) Performed: ROBOTIC ASSISTED NAVIGATIONAL BRONCHOSCOPY VIDEO BRONCHOSCOPY WITH RADIAL ENDOBRONCHIAL ULTRASOUND BRONCHIAL NEEDLE ASPIRATION BIOPSIES BRONCHIAL BRUSHINGS     Patient location during evaluation: PACU Anesthesia Type: General Level of consciousness: awake and alert Pain management: pain level controlled Vital Signs Assessment: post-procedure vital signs reviewed and stable Respiratory status: spontaneous breathing, nonlabored ventilation, respiratory function stable and patient connected to nasal cannula oxygen Cardiovascular status: blood pressure returned to baseline and stable Postop Assessment: no apparent nausea or vomiting Anesthetic complications: no   No notable events documented.  Last Vitals:  Vitals:   04/28/22 1045 04/28/22 1100  BP: (!) 119/96 123/66  Pulse: 76 80  Resp: 19 12  Temp:    SpO2: 97% 94%    Last Pain:  Vitals:   04/28/22 1100  TempSrc:   PainSc: 0-No pain                 Santa Lighter

## 2022-04-28 NOTE — Interval H&P Note (Signed)
History and Physical Interval Note:  04/28/2022 8:56 AM  Mark Harrell.  has presented today for surgery, with the diagnosis of [place].  The various methods of treatment have been discussed with the patient and family. After consideration of risks, benefits and other options for treatment, the patient has consented to  Procedure(s): ROBOTIC ASSISTED NAVIGATIONAL BRONCHOSCOPY (N/A) as a surgical intervention.  The patient's history has been reviewed, patient examined, no change in status, stable for surgery.  I have reviewed the patient's chart and labs.  Questions were answered to the patient's satisfaction.     Collene Gobble

## 2022-04-28 NOTE — Anesthesia Preprocedure Evaluation (Addendum)
Anesthesia Evaluation  Patient identified by MRN, date of birth, ID band Patient awake    Reviewed: Allergy & Precautions, NPO status , Patient's Chart, lab work & pertinent test results, reviewed documented beta blocker date and time   History of Anesthesia Complications (+) PROLONGED EMERGENCE, Family history of anesthesia reaction and history of anesthetic complications  Airway Mallampati: II  TM Distance: >3 FB Neck ROM: Full    Dental  (+) Teeth Intact, Dental Advisory Given   Pulmonary asthma , sleep apnea , COPD (1.5L [place]),  oxygen dependent, former smoker BILATERAL LOWER LOBE NODULES   Pulmonary exam normal breath sounds clear to auscultation       Cardiovascular hypertension, Pt. 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  Reproductive/Obstetrics                             Anesthesia Physical Anesthesia Plan  ASA: 4  Anesthesia Plan: General   Post-op Pain Management: Tylenol PO (pre-op)*   Induction: Intravenous  PONV Risk Score and Plan: 2 and Dexamethasone, Ondansetron and TIVA  Airway Management Planned: Oral ETT  Additional Equipment:   Intra-op Plan:   Post-operative Plan: Extubation in OR  Informed Consent: I have reviewed the patients History and Physical, chart, labs and discussed the procedure including the risks, benefits and alternatives  for the proposed anesthesia with the patient or authorized representative who has indicated his/her understanding and acceptance.     Dental advisory given  Plan Discussed with: CRNA  Anesthesia Plan Comments:        Anesthesia Quick Evaluation

## 2022-04-29 LAB — ACID FAST SMEAR (AFB, MYCOBACTERIA): Acid Fast Smear: NEGATIVE

## 2022-04-29 NOTE — Telephone Encounter (Signed)
REFILLED BY PCP

## 2022-04-30 LAB — CULTURE, BAL-QUANTITATIVE W GRAM STAIN
Culture: NO GROWTH
Gram Stain: NONE SEEN

## 2022-05-01 LAB — CYTOLOGY - NON PAP

## 2022-05-02 ENCOUNTER — Encounter (HOSPITAL_COMMUNITY): Payer: Self-pay | Admitting: Emergency Medicine

## 2022-05-02 ENCOUNTER — Telehealth: Payer: Self-pay | Admitting: Emergency Medicine

## 2022-05-02 NOTE — Telephone Encounter (Signed)
Called patient, left voicemail.  He is cytologies from bronchoscopy are all negative.  Smears are negative and cultures are all pending.  We will follow-up culture data as it becomes available and at office visit.

## 2022-05-03 LAB — AEROBIC/ANAEROBIC CULTURE W GRAM STAIN (SURGICAL/DEEP WOUND)
Culture: NO GROWTH
Gram Stain: NONE SEEN

## 2022-05-05 ENCOUNTER — Ambulatory Visit: Payer: BC Managed Care – PPO | Admitting: Orthopedic Surgery

## 2022-05-08 ENCOUNTER — Encounter: Payer: Self-pay | Admitting: Internal Medicine

## 2022-05-09 ENCOUNTER — Encounter: Payer: Self-pay | Admitting: Emergency Medicine

## 2022-05-09 ENCOUNTER — Ambulatory Visit (INDEPENDENT_AMBULATORY_CARE_PROVIDER_SITE_OTHER): Payer: BC Managed Care – PPO | Admitting: Emergency Medicine

## 2022-05-09 VITALS — BP 128/70 | HR 87 | Ht 72.0 in | Wt 330.2 lb

## 2022-05-09 DIAGNOSIS — R918 Other nonspecific abnormal finding of lung field: Secondary | ICD-10-CM

## 2022-05-09 DIAGNOSIS — R9389 Abnormal findings on diagnostic imaging of other specified body structures: Secondary | ICD-10-CM

## 2022-05-09 DIAGNOSIS — J9611 Chronic respiratory failure with hypoxia: Secondary | ICD-10-CM | POA: Diagnosis not present

## 2022-05-09 DIAGNOSIS — I82461 Acute embolism and thrombosis of right calf muscular vein: Secondary | ICD-10-CM | POA: Diagnosis not present

## 2022-05-09 DIAGNOSIS — J4489 Other specified chronic obstructive pulmonary disease: Secondary | ICD-10-CM | POA: Diagnosis not present

## 2022-05-09 NOTE — Assessment & Plan Note (Signed)
No longer on CPAP, sleeping with oxygen 1.5 L/min.  He is interested in having a written prescription for this to see if he can purchase equipment.  We will provide him with a prescription.

## 2022-05-09 NOTE — Patient Instructions (Addendum)
We reviewed bronchoscopy results today.  We will continue to follow your culture information until final We will plan to repeat your CT scan of the chest in mid February to compare with priors.  Depending on that result we will decide if any other workup for your pulmonary nodules is needed Continue oxygen 1.5 L/min at night while sleeping and 2 L/min with exertion.  We will give you prescriptions for this. We will ask [facility] to provide you with a rolling tank to use 2 L/min with exertion when you are unable to carry your portable concentrator Continue Trelegy 1 inhalation once daily.  Rinse and gargle after using. Use albuterol 2 puffs up every 4 hours if needed for shortness of breath Follow Dr. Lamonte Sakai in February after your CT scan so we can review the results together.

## 2022-05-09 NOTE — Assessment & Plan Note (Signed)
Continue Trelegy, albuterol as needed 

## 2022-05-09 NOTE — Progress Notes (Signed)
Subjective:    Patient ID: Mark Harrell., male    DOB: [birth year], 61 y.o.   MRN: [id number]  HPI  ROV 04/01/2022 --follow-up visit for 61 year old gentleman with a minimal tobacco history, asthma/COPD (previous Nucala, Dupixent), OSA no longer on CPAP, DVT.  He had COVID-pneumonia and bilateral bacterial pneumonia in early 2023.  I subsequently saw him for abnormal CT scan of the chest with some confluent masslike nodular opacities.  These seemed to be smaller.  I had recommended PET scan/CT scan in October to evaluate further.  Ultimately the PET scan was done without the CT chest as below.  PET scan 03/20/2022 reviewed by me showed that the rounded component of the bilateral lower lobe pulmonary nodules are unchanged in size or appearance.  There is some adjacent associated hypermetabolism although the larger components of the nodules do not appear to be hypermetabolic.  No thoracic adenopathy or metastatic disease  ROV 05/09/22 --Mark Harrell is 18, has a history of fixed asthma/COPD, minimal tobacco history, OSA, DVT, COVID-pneumonia in early 2023.  I have seen him for abnormal CT scan of the chest that showed bilateral rounded lower lobe opacities, question some mild associated hypermetabolism on PET scan 03/20/2022.  He went for bronchoscopy on 04/28/2022, somewhat technically difficult due to his body habitus and the location of the nodules.  The procedure was done in the left lateral decubitus position.  His cytologies from transbronchial biopsies were all negative.  AFB and fungal smears are negative, cultures all pending.   Review of Systems As per HPI  Past Medical History:  Diagnosis Date   Anesthesia complication requiring reversal agent administration    ? from central apnea, very difficult to get off vent   Anxiety    Arthritis    osteo   Asthma    BPH (benign prostatic hyperplasia)    Complication of anesthesia    difficulty waking , they twlight me because of my respiratory  problems "   COPD (chronic obstructive pulmonary disease) (HCC)    COVID    moderate case 2023   Depression    DVT (deep venous thrombosis) ([place]) 2023   right leg   Dyspnea    on exertion   Enlarged heart    Family history of adverse reaction to anesthesia    mother trouble waking up, and heart stopped   GERD (gastroesophageal reflux disease)    Headache    botox injections for headaches   Hyperlipidemia    Hypertension    Hypogonadism male    IBS (irritable bowel syndrome)    Memory difficulties    short term memories   Neuropathy    Obesity    On home oxygen therapy    on 2 liter   OSA (obstructive sleep apnea)    not using cpap   Paralysis (HCC)    left hand - small    due to accident with an head injury   Pneumonia    Pre-diabetes    Prostatitis      Family History  Problem Relation Age of Onset   Diabetes Paternal Uncle    Cancer Father        lymphoma, colon   Diabetes Maternal Grandmother    Heart disease Maternal Grandfather    Diabetes Maternal Grandfather    Diabetes Paternal Grandmother    Diabetes Paternal Grandfather    Dementia Mother    Prostate cancer Maternal Uncle    Lung disease Neg Hx  Rheumatologic disease Neg Hx      Social History   Socioeconomic History   Marital status: Single    Spouse name: Not on file   Number of children: Not on file   Years of education: Not on file   Highest education level: Not on file  Occupational History   Occupation: Dance movement psychotherapist  Tobacco Use   Smoking status: Former    Packs/day: 0.10    Years: 15.00    Total pack years: 1.50    Types: Cigarettes    Quit date: 05/27/1983    Years since quitting: 38.9    Passive exposure: Past   Smokeless tobacco: Never   Tobacco comments:    significant second-hand exposure through mother  Vaping Use   Vaping Use: Never used  Substance and Sexual Activity   Alcohol use: Yes    Alcohol/week: 1.0 standard drink of alcohol    Types: 1 Glasses of wine  per week    Comment: 2 x a year   Drug use: No   Sexual activity: Never    Birth control/protection: None  Other Topics Concern   Not on file  Social History Narrative   [facility]:   Originally from [place]. Previously has lived in [place]. He has lived in [place], [place], & [place]. He has worked in Engineer, production. No pets currently. Brief exposure to a roommates bird Secretary/administrator) in college. No mold, asbestos, or hot tub exposure.    Social Determinants of Health   Financial Resource Strain: Not on file  Food Insecurity: No Food Insecurity (10/08/2021)   Hunger Vital Sign    Worried About Running Out of Food in the Last Year: Never true    Ran Out of Food in the Last Year: Never true  Transportation Needs: No Transportation Needs (10/08/2021)   PRAPARE - Hydrologist (Medical): No    Lack of Transportation (Non-Medical): No  Physical Activity: Not on file  Stress: Not on file  Social Connections: Not on file  Intimate Partner Violence: Not on file     Allergies  Allergen Reactions   Bee Venom Swelling   Cymbalta [Duloxetine Hcl] Shortness Of Breath    Brought on asthma   Ppd [Tuberculin Purified Protein Derivative] Other (See Comments)    +ppd NEG Quantferron Gold 3/13 (shows false positive)    Calan [Verapamil] Other (See Comments)    Back pain   Tricor [Fenofibrate] Other (See Comments)    Back pain   Claritin [Loratadine] Other (See Comments)    Unknown reaction    Levaquin [Levofloxacin] Diarrhea     Outpatient Medications Prior to Visit  Medication Sig Dispense Refill   acetaminophen (TYLENOL) 500 MG tablet Take 1,000 mg by mouth 3 (three) times daily.     anastrozole (ARIMIDEX) 1 MG tablet Take 1 mg by mouth daily.     arformoterol (BROVANA) 15 MCG/2ML NEBU Take 2 mLs (15 mcg total) by nebulization in the morning and at bedtime. 120 mL 5   ascorbic acid (VITAMIN C) 1000 MG tablet Take 1,000 mg by mouth every  evening.     aspirin EC 81 MG tablet Take 81 mg by mouth daily. Swallow whole.     Azelastine HCl 137 MCG/SPRAY SOLN Place 2 sprays into both nostrils 2 (two) times daily.     benzonatate (TESSALON) 200 MG capsule Take 1 capsule 3 x /day to Prevent Cough                                                     /  TAKE                               BY                              MOUTH 90 capsule 1   Botulinum Toxin Type A 200 units SOLR Inject 200 Units into the skin every 3 (three) months.     budesonide (PULMICORT) 0.5 MG/2ML nebulizer solution Take 2 mLs (0.5 mg total) by nebulization 2 (two) times daily.     Calcium Carb-Cholecalciferol (CALCIUM + VITAMIN D3 PO) Take 1 tablet by mouth daily.     celecoxib (CELEBREX) 200 MG capsule TAKE 1 CAPSULE (200 MG TOTAL) BY MOUTH EVERY 12 (TWELVE) HOURS. 60 capsule 2   cetaphil (CETAPHIL) lotion Apply 1 Application topically 2 (two) times daily.     cetirizine (ZYRTEC) 10 MG tablet Take 1 tablet (10 mg total) by mouth in the morning.     Cholecalciferol (VITAMIN D3) 125 MCG (5000 UT) TABS Take 5,000-10,000 Units by mouth See admin instructions. Take 10,000 in the morning and 5,000 units in the evening     Cinnamon 500 MG capsule Take 1,000 mg by mouth 3 (three) times daily.     Coenzyme Q10 (CO Q 10 PO) Take 600 mg by mouth daily.     cyclobenzaprine (FLEXERIL) 10 MG tablet Take 1 tablet (10 mg total) by mouth 3 (three) times daily.  FOR MUSCLE SPASM (NECK)     diclofenac Sodium (VOLTAREN) 1 % GEL Apply 2-4 g topically 2 (two) times daily as needed (pain).     donepezil (ARICEPT) 23 MG TABS tablet Take 1 tablet Daily for Memory 90 tablet 3   doxycycline (ADOXA) 100 MG tablet TAKE 1 TABLET BY MOUTH TWICE A DAY 60 tablet 3   EPINEPHrine 0.3 mg/0.3 mL IJ SOAJ injection Inject 0.3 mg into the muscle as needed for anaphylaxis.     fluticasone (FLONASE) 50 MCG/ACT nasal spray Place 2 sprays into both nostrils 2 (two) times daily.      furosemide (LASIX) 80 MG tablet Take 80 mg by mouth daily as needed for edema.     Guaifenesin 1200 MG TB12 Take 1,200 mg by mouth 3 (three) times daily.     ipratropium-albuterol (DUONEB) 0.5-2.5 (3) MG/3ML SOLN Take 3 mLs by nebulization 2 (two) times daily.     Krill Oil 500 MG CAPS Take 500 mg by mouth 2 (two) times daily.     Magnesium 500 MG CAPS Take 500 mg by mouth 3 (three) times daily.     memantine (NAMENDA) 10 MG tablet Take 10 mg by mouth 2 (two) times daily.     metFORMIN (GLUCOPHAGE-XR) 500 MG 24 hr tablet TAKES 2 TABLETS 2 X /DAY WITH MEALS FOR DIABETES 360 tablet 3   metoprolol tartrate (LOPRESSOR) 25 MG tablet Take  1 tablet  2 x /day (every 12 hours)  for BP                                           /                               TAKE  BY                  MOUTH 180 tablet 3   Misc Natural Products (GLUCOS-CHONDROIT-MSM COMPLEX) TABS Take 2 tablets by mouth 3 (three) times daily.     montelukast (SINGULAIR) 10 MG tablet TAKE 1 TABLET BY MOUTH DAILY FOR ALLERGIES 90 tablet 0   Multiple Vitamins-Minerals (MULTIVITAMIN WITH MINERALS) tablet Take 1 tablet by mouth every morning.     Multiple Vitamins-Minerals (PRESERVISION AREDS 2) CAPS Take 1 capsule by mouth 2 (two) times daily. Evening and night     oxymetazoline (AFRIN) 0.05 % nasal spray Place 1 spray into both nostrils at bedtime.     pentosan polysulfate (ELMIRON) 100 MG capsule Take 100 mg by mouth 3 (three) times daily.     potassium chloride SA (KLOR-CON M) 20 MEQ tablet Take 1 tablet (20 mEq total) by mouth 2 (two) times daily.     pravastatin (PRAVACHOL) 40 MG tablet TAKE 1 TABLET BY MOUTH AT BEDTIME FOR CHOLESTEROL (Patient taking differently: Take 40 mg by mouth at bedtime.) 90 tablet 3   pregabalin (LYRICA) 150 MG capsule TAKE 1 CAPSULE 3 TIMES A DAY FOR CHRONIC PAIN 270 capsule 0   PROBIOTIC PRODUCT PO Take 1 capsule by mouth 2 (two) times daily. VSL     promethazine-dextromethorphan (PROMETHAZINE-DM)  6.25-15 MG/5ML syrup Take 5 mLs by mouth 4 (four) times daily as needed for cough.     Semaglutide, 2 MG/DOSE, (OZEMPIC, 2 MG/DOSE,) 8 MG/3ML SOPN INJECT 2 MG INTO SKIN EACH WEEK 3 mL 2   sertraline (ZOLOFT) 100 MG tablet Take 1 tablet (100 mg total) by mouth 2 (two) times daily. for Mood / Depression     sodium chloride (OCEAN) 0.65 % SOLN nasal spray Place 1 spray into both nostrils 4 (four) times daily as needed for congestion. Uses each time before other nasal sprays     tadalafil (CIALIS) 5 MG tablet Take 5 mg by mouth daily.      trospium (SANCTURA) 20 MG tablet Take 20 mg by mouth 2 (two) times daily. In the evening and at bedtime     TURMERIC PO Take 2,000 mg by mouth 3 (three) times daily. 1000 mg each     VENTOLIN HFA 108 (90 Base) MCG/ACT inhaler INHALE 2 PUFFS BY MOUTH EVERY 4 HOURS AS NEEDED FOR WHEEZE OR FOR SHORTNESS OF BREATH 18 each 1   zinc gluconate 50 MG tablet Take 50 mg by mouth 3 (three) times daily.     No facility-administered medications prior to visit.        Objective:   Physical Exam  Vitals:   05/09/22 1532  BP: 128/70  Pulse: 87  SpO2: 96%  Weight: (!) 330 lb 3.2 oz (149.8 kg)  Height: 6' (1.829 m)   Gen: Pleasant, obese, in no distress,  normal affect  ENT: No lesions,  mouth clear,  oropharynx clear, no postnasal drip  Neck: No JVD, no stridor  Lungs: No use of accessory muscles, no crackles or wheezing on normal respiration, no wheeze on forced expiration  Cardiovascular: RRR, heart sounds normal, no murmur or gallops, trace peripheral edema  Musculoskeletal: No deformities, no cyanosis or clubbing  Neuro: alert, awake, non focal  Skin: Warm, no lesions or rash      Assessment & Plan:   Asthma-COPD overlap syndrome (HCC) Continue Trelegy, albuterol as needed  Acute deep vein thrombosis (DVT) of right lower extremity (HCC) Completed anticoagulation  OSA (obstructive sleep  apnea) No longer on CPAP, sleeping with oxygen 1.5 L/min.   He is interested in having a written prescription for this to see if he can purchase equipment.  We will provide him with a prescription.  Abnormal CT of the chest Biopsies of his right basilar rounded nodule, mid right lower lobe nodule both negative on cytology.  The bronchoscopy was technically challenging due to nodule location and atelectasis.  His cultures are negative so far.  We will check serial imaging in 3 months.  If suspicion remains then we will arrange for repeat biopsies, either bronchoscopy or transthoracic needle biopsy.   Baltazar Apo, MD, PhD 05/09/2022, 4:09 PM  Pulmonary and Critical Care [phone] or if no answer before 7:00PM call [phone] For any issues after 7:00PM please call eLink [phone]

## 2022-05-09 NOTE — Assessment & Plan Note (Signed)
Completed anticoagulation

## 2022-05-09 NOTE — Assessment & Plan Note (Signed)
Biopsies of his right basilar rounded nodule, mid right lower lobe nodule both negative on cytology.  The bronchoscopy was technically challenging due to nodule location and atelectasis.  His cultures are negative so far.  We will check serial imaging in 3 months.  If suspicion remains then we will arrange for repeat biopsies, either bronchoscopy or transthoracic needle biopsy.

## 2022-05-11 DIAGNOSIS — R069 Unspecified abnormalities of breathing: Secondary | ICD-10-CM | POA: Diagnosis not present

## 2022-05-11 DIAGNOSIS — G4733 Obstructive sleep apnea (adult) (pediatric): Secondary | ICD-10-CM | POA: Diagnosis not present

## 2022-05-11 DIAGNOSIS — J449 Chronic obstructive pulmonary disease, unspecified: Secondary | ICD-10-CM | POA: Diagnosis not present

## 2022-05-12 ENCOUNTER — Other Ambulatory Visit: Payer: Self-pay

## 2022-05-12 MED ORDER — CELECOXIB 200 MG PO CAPS: ORAL_CAPSULE | ORAL | 2 refills | Status: DC

## 2022-05-13 ENCOUNTER — Encounter: Payer: Self-pay | Admitting: Nurse Practitioner

## 2022-05-13 ENCOUNTER — Ambulatory Visit: Payer: BC Managed Care – PPO | Admitting: Orthopedic Surgery

## 2022-05-13 ENCOUNTER — Ambulatory Visit (INDEPENDENT_AMBULATORY_CARE_PROVIDER_SITE_OTHER): Payer: BC Managed Care – PPO | Admitting: Nurse Practitioner

## 2022-05-13 VITALS — BP 128/72 | HR 86 | Temp 97.5°F | Ht 72.0 in | Wt 330.0 lb

## 2022-05-13 DIAGNOSIS — J449 Chronic obstructive pulmonary disease, unspecified: Secondary | ICD-10-CM

## 2022-05-13 DIAGNOSIS — I82461 Acute embolism and thrombosis of right calf muscular vein: Secondary | ICD-10-CM

## 2022-05-13 DIAGNOSIS — R053 Chronic cough: Secondary | ICD-10-CM

## 2022-05-13 DIAGNOSIS — Z79899 Other long term (current) drug therapy: Secondary | ICD-10-CM | POA: Diagnosis not present

## 2022-05-13 DIAGNOSIS — G4733 Obstructive sleep apnea (adult) (pediatric): Secondary | ICD-10-CM | POA: Diagnosis not present

## 2022-05-13 NOTE — Patient Instructions (Signed)
Anticoagulants You will learn about anticoagulants and what it is for, and the risks and benefits of taking this medicine. To view the content, go to this web address: [email or web address]  This video will expire on: 01/29/2024. If you need access to this video following this date, please reach out to the healthcare provider who assigned it to you. This information is not intended to replace advice given to you by your health care provider. Make sure you discuss any questions you have with your health care provider. Elsevier Patient Education  [place].

## 2022-05-13 NOTE — Progress Notes (Signed)
Hospital follow up  Assessment and Plan: Hospital visit follow up for:   Chronic cough Continue to follow with Pulmonology Continue further left sided bronchoscopy 06/2022  OSA and COPD overlap syndrome ([place]) Cannot tolerate CPAP Continue night oxygen Continue to follow with Pulmonology  Acute deep vein thrombosis (DVT) of calf muscle vein of right lower extremity ([place]) Currently off anticoagulant Discussed benefit vs risk of longer term anticoagulation.  Medication management All medications discussed and reviewed in full. All questions and concerns regarding medications addressed.     All medications were reviewed with patient and fully reconciled. All questions answered fully, and patient and family members were encouraged to call the office with any further questions or concerns. Discussed goal to avoid readmission related to this diagnosis.   Over 40 minutes of exam, counseling, chart review, and complex, high/moderate level critical decision making was performed this visit.   Future Appointments  Date Time Provider [place]  05/27/2022  3:45 PM [name], MD [name]  06/05/2022  4:00 PM Darrol Jump, NP [name]  06/10/2022  4:00 PM [name], MD [name]  07/10/2022  4:15 PM Tyson Dense T, DPM [name]  09/09/2022  4:00 PM [name], MD [name]  02/24/2023  3:00 PM [name], MD [name]     HPI 61 y.o.male presents for follow up for transition from recent hospitalization for right sided bronchoscopy. Admit date to the hospital was 04/28/22, patient was discharged from the hospital on 04/28/22 and our clinical staff contacted the office the day after discharge to set up a follow up appointment. The discharge summary, medications, and diagnostic test results were reviewed before meeting with the patient.   He followed up with Dr. Lamonte Sakai, Pulmonology on 05/09/22 for asthma/COPD overlap syndrome.  He  was instructed to continue Trelegy and albuterol PRN.  For acute DVT he has completed anticoagulation however, questions if he should continue this medication based on the possible PE that could have been in his lung but was not confirmed, only DVT.  He also discussed OSA noting he is no longer wearing CPAP but sleeping with 1.5L of O2/min.    He had an abnormal CT of chest that included biopsies of his right basilar nodule and mid right lower lobe nodule which were both negative on cytology.  Bronchoscopy was noted to be challenging d/t nodule location and atelectasis.  Cultures have negative thus far.  He is set to check serial imaging in 3 months.  If any suspicion remains, then he will continue with repeat biopsies via bronchoscopy or transthoracic needle biopsy.  Overall he reports feeling well today.    Home health is not involved.   Images while in the hospital: [facility] Chest Port 1 View  Result Date: 04/28/2022 CLINICAL DATA:  Status post bronchoscopy with biopsy. EXAM: PORTABLE CHEST 1 VIEW COMPARISON:  10/16/2021 and CT chest 04/07/2022. FINDINGS: Trachea is midline. Heart is enlarged, stable. Bilateral lower lobe peribronchovascular and subpleural nodularity/nodular consolidation, better seen on 04/07/2022. No definite pneumothorax. No definite pleural fluid. IMPRESSION: None 1. No definite pneumothorax status post bronchoscopic biopsy. 2. Bilateral lower lobe peribronchovascular and subpleural nodularity/nodular consolidation, better seen on 04/07/2022. Findings may be due to sarcoid. Electronically Signed   By: Lorin Picket M.D.   On: 04/28/2022 10:58   DG C-ARM BRONCHOSCOPY  Result Date: 04/28/2022 C-ARM BRONCHOSCOPY: Fluoroscopy was utilized by the requesting physician.  No radiographic interpretation.     Current Outpatient Medications (Endocrine &  Metabolic):    metFORMIN (GLUCOPHAGE-XR) 500 MG 24 hr tablet, TAKES 2 TABLETS 2 X /DAY WITH MEALS FOR DIABETES   Semaglutide, 2  MG/DOSE, (OZEMPIC, 2 MG/DOSE,) 8 MG/3ML SOPN, INJECT 2 MG INTO SKIN EACH WEEK (Patient not taking: Reported on 05/13/2022)  Current Outpatient Medications (Cardiovascular):    EPINEPHrine 0.3 mg/0.3 mL IJ SOAJ injection, Inject 0.3 mg into the muscle as needed for anaphylaxis.   furosemide (LASIX) 80 MG tablet, Take 80 mg by mouth daily as needed for edema.   metoprolol tartrate (LOPRESSOR) 25 MG tablet, Take  1 tablet  2 x /day (every 12 hours)  for BP                                           /                               TAKE                 BY                  MOUTH   pravastatin (PRAVACHOL) 40 MG tablet, TAKE 1 TABLET BY MOUTH AT BEDTIME FOR CHOLESTEROL (Patient taking differently: Take 40 mg by mouth at bedtime.)   tadalafil (CIALIS) 5 MG tablet, Take 5 mg by mouth daily.   Current Outpatient Medications (Respiratory):    arformoterol (BROVANA) 15 MCG/2ML NEBU, Take 2 mLs (15 mcg total) by nebulization in the morning and at bedtime.   Azelastine HCl 137 MCG/SPRAY SOLN, Place 2 sprays into both nostrils 2 (two) times daily.   benzonatate (TESSALON) 200 MG capsule, Take 1 capsule 3 x /day to Prevent Cough                                                     /                                  TAKE                               BY                              MOUTH   budesonide (PULMICORT) 0.5 MG/2ML nebulizer solution, Take 2 mLs (0.5 mg total) by nebulization 2 (two) times daily.   cetirizine (ZYRTEC) 10 MG tablet, Take 1 tablet (10 mg total) by mouth in the morning.   fluticasone (FLONASE) 50 MCG/ACT nasal spray, Place 2 sprays into both nostrils 2 (two) times daily.   Guaifenesin 1200 MG TB12, Take 1,200 mg by mouth 3 (three) times daily.   ipratropium-albuterol (DUONEB) 0.5-2.5 (3) MG/3ML SOLN, Take 3 mLs by nebulization 2 (two) times daily.   montelukast (SINGULAIR) 10 MG tablet, TAKE 1 TABLET BY MOUTH DAILY FOR ALLERGIES   oxymetazoline (AFRIN) 0.05 % nasal spray, Place 1 spray into both  nostrils at bedtime.   promethazine-dextromethorphan (PROMETHAZINE-DM) 6.25-15 MG/5ML syrup, Take 5 mLs by mouth 4 (  four) times daily as needed for cough.   sodium chloride (OCEAN) 0.65 % SOLN nasal spray, Place 1 spray into both nostrils 4 (four) times daily as needed for congestion. Uses each time before other nasal sprays   TRELEGY ELLIPTA 100-62.5-25 MCG/ACT AEPB, Inhale 1 puff into the lungs daily.   VENTOLIN HFA 108 (90 Base) MCG/ACT inhaler, INHALE 2 PUFFS BY MOUTH EVERY 4 HOURS AS NEEDED FOR WHEEZE OR FOR SHORTNESS OF BREATH  Current Outpatient Medications (Analgesics):    acetaminophen (TYLENOL) 500 MG tablet, Take 1,000 mg by mouth 3 (three) times daily.   aspirin EC 81 MG tablet, Take 81 mg by mouth daily. Swallow whole.   celecoxib (CELEBREX) 200 MG capsule, TAKE 1 CAPSULE (200 MG TOTAL) BY MOUTH EVERY 12 (TWELVE) HOURS.   Current Outpatient Medications (Other):    anastrozole (ARIMIDEX) 1 MG tablet, Take 1 mg by mouth daily.   ascorbic acid (VITAMIN C) 1000 MG tablet, Take 1,000 mg by mouth every evening.   Botulinum Toxin Type A 200 units SOLR, Inject 200 Units into the skin every 3 (three) months.   Calcium Carb-Cholecalciferol (CALCIUM + VITAMIN D3 PO), Take 1 tablet by mouth daily.   cetaphil (CETAPHIL) lotion, Apply 1 Application topically 2 (two) times daily.   Cholecalciferol (VITAMIN D3) 125 MCG (5000 UT) TABS, Take 5,000-10,000 Units by mouth See admin instructions. Take 10,000 in the morning and 5,000 units in the evening   Cinnamon 500 MG capsule, Take 1,000 mg by mouth 3 (three) times daily.   Coenzyme Q10 (CO Q 10 PO), Take 600 mg by mouth daily.   cyclobenzaprine (FLEXERIL) 10 MG tablet, Take 1 tablet (10 mg total) by mouth 3 (three) times daily.  FOR MUSCLE SPASM (NECK)   diclofenac Sodium (VOLTAREN) 1 % GEL, Apply 2-4 g topically 2 (two) times daily as needed (pain).   donepezil (ARICEPT) 23 MG TABS tablet, Take 1 tablet Daily for Memory   doxycycline (ADOXA) 100  MG tablet, TAKE 1 TABLET BY MOUTH TWICE A DAY   Krill Oil 500 MG CAPS, Take 500 mg by mouth 2 (two) times daily.   Magnesium 500 MG CAPS, Take 500 mg by mouth 3 (three) times daily.   memantine (NAMENDA) 10 MG tablet, Take 10 mg by mouth 2 (two) times daily.   Misc Natural Products (GLUCOS-CHONDROIT-MSM COMPLEX) TABS, Take 2 tablets by mouth 3 (three) times daily.   Multiple Vitamins-Minerals (MULTIVITAMIN WITH MINERALS) tablet, Take 1 tablet by mouth every morning.   Multiple Vitamins-Minerals (PRESERVISION AREDS 2) CAPS, Take 1 capsule by mouth 2 (two) times daily. Evening and night   pentosan polysulfate (ELMIRON) 100 MG capsule, Take 100 mg by mouth 3 (three) times daily.   potassium chloride SA (KLOR-CON M) 20 MEQ tablet, Take 1 tablet (20 mEq total) by mouth 2 (two) times daily.   pregabalin (LYRICA) 150 MG capsule, TAKE 1 CAPSULE 3 TIMES A DAY FOR CHRONIC PAIN   PROBIOTIC PRODUCT PO, Take 1 capsule by mouth 2 (two) times daily. VSL   sertraline (ZOLOFT) 100 MG tablet, Take 1 tablet (100 mg total) by mouth 2 (two) times daily. for Mood / Depression   trospium (SANCTURA) 20 MG tablet, Take 20 mg by mouth 2 (two) times daily. In the evening and at bedtime   TURMERIC PO, Take 2,000 mg by mouth 3 (three) times daily. 1000 mg each   zinc gluconate 50 MG tablet, Take 50 mg by mouth 3 (three) times daily.  Past Medical History:  Diagnosis Date   Anesthesia complication requiring reversal agent administration    ? from central apnea, very difficult to get off vent   Anxiety    Arthritis    osteo   Asthma    BPH (benign prostatic hyperplasia)    Complication of anesthesia    difficulty waking , they twlight me because of my respiratory problems "   COPD (chronic obstructive pulmonary disease) ([place])    COVID    moderate case 2023   Depression    DVT (deep venous thrombosis) ([place]) 2023   right leg   Dyspnea    on exertion   Enlarged heart    Family history of adverse reaction to  anesthesia    mother trouble waking up, and heart stopped   GERD (gastroesophageal reflux disease)    Headache    botox injections for headaches   Hyperlipidemia    Hypertension    Hypogonadism male    IBS (irritable bowel syndrome)    Memory difficulties    short term memories   Neuropathy    Obesity    On home oxygen therapy    on 2 liter   OSA (obstructive sleep apnea)    not using cpap   Paralysis (H[facility])    left hand - small    due to accident with an head injury   Pneumonia    Pre-diabetes    Prostatitis      Allergies  Allergen Reactions   Bee Venom Swelling   Cymbalta [Duloxetine Hcl] Shortness Of Breath    Brought on asthma   Ppd [Tuberculin Purified Protein Derivative] Other (See Comments)    +ppd NEG Quantferron Gold 3/13 (shows false positive)    Calan [Verapamil] Other (See Comments)    Back pain   Tricor [Fenofibrate] Other (See Comments)    Back pain   Claritin [Loratadine] Other (See Comments)    Unknown reaction    Levaquin [Levofloxacin] Diarrhea    ROS: all negative except above.   Physical Exam: Filed Weights   05/13/22 1507  Weight: (!) 330 lb (149.7 kg)   BP 128/72   Pulse 86   Temp (!) 97.5 F (36.4 C)   Ht 6' (1.829 m)   Wt (!) 330 lb (149.7 kg)   SpO2 91%   BMI 44.76 kg/m  General Appearance: Well nourished, in no apparent distress. Eyes: PERRLA, EOMs, conjunctiva no swelling or erythema Sinuses: No Frontal/maxillary tenderness ENT/Mouth: Ext aud canals clear, TMs without erythema, bulging. No erythema, swelling, or exudate on post pharynx.  Tonsils not swollen or erythematous. Hearing normal.  Neck: Supple, thyroid normal.  Respiratory: Respiratory effort normal, BS equal bilaterally without rales, rhonchi, wheezing or stridor.  Cardio: RRR with no MRGs. Brisk peripheral pulses without edema.  Abdomen: Soft, + BS.  Non tender, no guarding, rebound, hernias, masses. Lymphatics: Non tender without lymphadenopathy.   Musculoskeletal: Full ROM, 5/5 strength, normal gait.  Skin: Warm, dry without rashes, lesions, ecchymosis.  Neuro: Cranial nerves intact. Normal muscle tone, no cerebellar symptoms. Sensation intact.  Psych: Awake and oriented X 3, normal affect, Insight and Judgment appropriate.     Darrol Jump, NP 3:24 PM [facility]

## 2022-05-15 ENCOUNTER — Other Ambulatory Visit: Payer: Self-pay | Admitting: Internal Medicine

## 2022-05-15 DIAGNOSIS — J4541 Moderate persistent asthma with (acute) exacerbation: Secondary | ICD-10-CM

## 2022-05-15 MED ORDER — OZEMPIC (2 MG/DOSE) 8 MG/3ML ~~LOC~~ SOPN: PEN_INJECTOR | SUBCUTANEOUS | 3 refills | Status: DC

## 2022-05-23 ENCOUNTER — Other Ambulatory Visit: Payer: Self-pay | Admitting: Internal Medicine

## 2022-05-23 DIAGNOSIS — S161XXD Strain of muscle, fascia and tendon at neck level, subsequent encounter: Secondary | ICD-10-CM

## 2022-05-27 ENCOUNTER — Ambulatory Visit: Payer: BC Managed Care – PPO | Admitting: Orthopedic Surgery

## 2022-05-27 LAB — FUNGUS CULTURE WITH STAIN

## 2022-05-27 LAB — FUNGUS CULTURE RESULT

## 2022-05-27 LAB — FUNGAL ORGANISM REFLEX

## 2022-06-03 ENCOUNTER — Other Ambulatory Visit: Payer: Self-pay | Admitting: Internal Medicine

## 2022-06-03 ENCOUNTER — Other Ambulatory Visit: Payer: Self-pay | Admitting: Nurse Practitioner

## 2022-06-03 DIAGNOSIS — E876 Hypokalemia: Secondary | ICD-10-CM

## 2022-06-05 ENCOUNTER — Ambulatory Visit (INDEPENDENT_AMBULATORY_CARE_PROVIDER_SITE_OTHER): Payer: BC Managed Care – PPO | Admitting: Nurse Practitioner

## 2022-06-05 ENCOUNTER — Encounter: Payer: Self-pay | Admitting: Nurse Practitioner

## 2022-06-05 VITALS — BP 124/82 | HR 85 | Temp 98.6°F | Ht 72.0 in

## 2022-06-05 DIAGNOSIS — N181 Chronic kidney disease, stage 1: Secondary | ICD-10-CM

## 2022-06-05 DIAGNOSIS — E559 Vitamin D deficiency, unspecified: Secondary | ICD-10-CM | POA: Diagnosis not present

## 2022-06-05 DIAGNOSIS — G43009 Migraine without aura, not intractable, without status migrainosus: Secondary | ICD-10-CM

## 2022-06-05 DIAGNOSIS — E782 Mixed hyperlipidemia: Secondary | ICD-10-CM | POA: Diagnosis not present

## 2022-06-05 DIAGNOSIS — Z6841 Body Mass Index (BMI) 40.0 and over, adult: Secondary | ICD-10-CM

## 2022-06-05 DIAGNOSIS — G4733 Obstructive sleep apnea (adult) (pediatric): Secondary | ICD-10-CM

## 2022-06-05 DIAGNOSIS — F3341 Major depressive disorder, recurrent, in partial remission: Secondary | ICD-10-CM

## 2022-06-05 DIAGNOSIS — K219 Gastro-esophageal reflux disease without esophagitis: Secondary | ICD-10-CM | POA: Diagnosis not present

## 2022-06-05 DIAGNOSIS — E1122 Type 2 diabetes mellitus with diabetic chronic kidney disease: Secondary | ICD-10-CM | POA: Diagnosis not present

## 2022-06-05 DIAGNOSIS — S161XXD Strain of muscle, fascia and tendon at neck level, subsequent encounter: Secondary | ICD-10-CM

## 2022-06-05 DIAGNOSIS — J449 Chronic obstructive pulmonary disease, unspecified: Secondary | ICD-10-CM

## 2022-06-05 DIAGNOSIS — J4489 Other specified chronic obstructive pulmonary disease: Secondary | ICD-10-CM | POA: Diagnosis not present

## 2022-06-05 DIAGNOSIS — I10 Essential (primary) hypertension: Secondary | ICD-10-CM | POA: Diagnosis not present

## 2022-06-05 DIAGNOSIS — F0150 Vascular dementia without behavioral disturbance: Secondary | ICD-10-CM

## 2022-06-05 DIAGNOSIS — M5416 Radiculopathy, lumbar region: Secondary | ICD-10-CM

## 2022-06-05 DIAGNOSIS — Z79899 Other long term (current) drug therapy: Secondary | ICD-10-CM
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
## 2022-06-06 LAB — CBC WITH DIFFERENTIAL/PLATELET
Absolute Monocytes: 997 cells/uL — ABNORMAL HIGH (ref 200–950)
Basophils Absolute: 80 cells/uL (ref 0–200)
Basophils Relative: 0.9 %
Eosinophils Absolute: 267 cells/uL (ref 15–500)
Eosinophils Relative: 3 %
HCT: 44.1 % (ref 38.5–50.0)
Hemoglobin: 14.4 g/dL (ref 13.2–17.1)
Lymphs Abs: 2448 cells/uL (ref 850–3900)
MCH: 29.3 pg (ref 27.0–33.0)
MCHC: 32.7 g/dL (ref 32.0–36.0)
MCV: 89.6 fL (ref 80.0–100.0)
MPV: 10.7 fL (ref 7.5–12.5)
Monocytes Relative: 11.2 %
Neutro Abs: 5109 cells/uL (ref 1500–7800)
Neutrophils Relative %: 57.4 %
Platelets: 284 10*3/uL (ref 140–400)
RBC: 4.92 10*6/uL (ref 4.20–5.80)
RDW: 12.3 % (ref 11.0–15.0)
Total Lymphocyte: 27.5 %
WBC: 8.9 10*3/uL (ref 3.8–10.8)

## 2022-06-06 LAB — LIPID PANEL
Cholesterol: 166 mg/dL (ref <200)
HDL: 36 mg/dL — ABNORMAL LOW (ref >=40)
LDL Cholesterol (Calc): 97 mg/dL (calc)
Non-HDL Cholesterol (Calc): 130 mg/dL (calc) — ABNORMAL HIGH (ref <130)
Total CHOL/HDL Ratio: 4.6 (calc) (ref <5.0)
Triglycerides: 221 mg/dL — ABNORMAL HIGH (ref <150)

## 2022-06-06 LAB — COMPLETE METABOLIC PANEL WITH GFR
AG Ratio: 2 (calc) (ref 1.0–2.5)
ALT: 14 U/L (ref 9–46)
AST: 15 U/L (ref 10–35)
Albumin: 4 g/dL (ref 3.6–5.1)
Alkaline phosphatase (APISO): 78 U/L (ref 35–144)
BUN/Creatinine Ratio: 30 (calc) — ABNORMAL HIGH (ref 6–22)
BUN: 16 mg/dL (ref 7–25)
CO2: 30 mmol/L (ref 20–32)
Calcium: 9.3 mg/dL (ref 8.6–10.3)
Chloride: 104 mmol/L (ref 98–110)
Creat: 0.54 mg/dL — ABNORMAL LOW (ref 0.70–1.35)
Globulin: 2 g/dL (calc) (ref 1.9–3.7)
Glucose, Bld: 100 mg/dL — ABNORMAL HIGH (ref 65–99)
Potassium: 4.7 mmol/L (ref 3.5–5.3)
Sodium: 143 mmol/L (ref 135–146)
Total Bilirubin: 0.6 mg/dL (ref 0.2–1.2)
Total Protein: 6 g/dL — ABNORMAL LOW (ref 6.1–8.1)
eGFR: 113 mL/min/{1.73_m2} (ref >=60)

## 2022-06-06 LAB — HEMOGLOBIN A1C
Hgb A1c MFr Bld: 5.4 % of total Hgb (ref <5.7)
Mean Plasma Glucose: 108 mg/dL
eAG (mmol/L): 6 mmol/L

## 2022-06-06 LAB — VITAMIN D 25 HYDROXY (VIT D DEFICIENCY, FRACTURES): Vit D, 25-Hydroxy: 100 ng/mL (ref 30–100)

## 2022-06-10 ENCOUNTER — Encounter: Payer: Self-pay | Admitting: Allergy & Immunology

## 2022-06-10 ENCOUNTER — Other Ambulatory Visit: Payer: Self-pay

## 2022-06-10 ENCOUNTER — Ambulatory Visit: Payer: BC Managed Care – PPO | Admitting: Allergy & Immunology

## 2022-06-10 VITALS — BP 130/72 | HR 92 | Temp 98.4°F | Resp 20 | Ht 72.0 in | Wt 330.0 lb

## 2022-06-10 DIAGNOSIS — J4489 Other specified chronic obstructive pulmonary disease: Secondary | ICD-10-CM

## 2022-06-10 NOTE — Progress Notes (Signed)
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 - with several breakthrough infections as of late with multiple hospitalizations (has been on doxycycline for a few years, but we may need to consider imunoglobulin supplementation   Asthma-COPD overlap syndrome with worsening pulmonary status since contracting COVID pneumonia in January 2023 - (NO LONGER ON DUPIXENT due to concern for "immune suppression", although I have tried to reassure him that this is not the case)   Bilateral pulmonary nodules - benign biopsies in December 2023   Hymenoptera allergy - EpiPen up to date   GERD - on PRN H2 blocker    Obesity - with resulting nerve impingement and joint pain   Chronic back pain - s/p recent neck surgery   Migraines - on botulinum injections   Polypharmacy   Normal echocardiogram (May 2023)  Recent passing of his mother  Plan/Recommendations:   1. 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 - We many need to consider starting immunoglobulin replacement in the future, especially in light of the fact that you are continuing with multiple infections.  2. Asthma-COPD overlap syndrome - Lung testing looked much better today compared to the last time that I checked the breathing.  - We are going to work on getting the bottom of what is wrong with your lungs before we add an injectable medication again. - Daily controller medication(s): Trelegy one puff once daily - Prior to physical activity: albuterol 2 puffs 10-15 minutes before physical activity. - Rescue medications: albuterol 4 puffs every 4-6 hours as needed or DuoNeb nebulizer one vial every 4-6 hours as needed -  Asthma control goals:  * Full participation in all desired activities (may need albuterol before activity) * Albuterol use two time or less a week on average (not counting use with activity) * Cough interfering with sleep two time or less a month * Oral steroids no more than once a year * No hospitalizations   3. Chronic allergic rhinitis (sweet vernal grass, box elder, cat, weeds, ragweed, molds, cockroach, dust mite) - Continue with aszelastine/fluticasone 2 sprays per nostril up to twice daily. - Continue with saline mist 1-2 times daily.  - Continue with cetirizine '10mg'$  daily as needed for breakthrough symptoms. - Continue with montelukast '10mg'$  in the evening.  - Sinus CT in November 2023 was normal.   4. GERD - Continue famotidine as needed.  5. Return in about 6 months (around 12/09/2022).   Subjective:   Mark Harrell. is a 62 y.o. male presenting today for follow up of  Chief Complaint  Patient presents with   Follow-up    Follow up pt states everything has been going good so far.    Mark Harrell. has a history of the following: Patient Active Problem List   Diagnosis Date Noted   Unilateral primary osteoarthritis, left knee 02/24/2022   Unilateral primary osteoarthritis, right knee 02/24/2022   Abnormal CT of the chest 01/28/2022   Hypokalemia 09/26/2021   Dyspnea 09/25/2021   Acute deep vein thrombosis (DVT) of right lower extremity ([place]) 09/24/2021   OSA (obstructive sleep apnea) 09/24/2021   Herniation of cervical intervertebral disc with radiculopathy  Status post cervical discectomy 08/20/2020   Tracheomalacia 05/26/2019   Acquired tracheomalacia 05/26/2019   HNP (herniated nucleus pulposus) with myelopathy, cervical 05/23/2019    Class: Chronic   Spinal stenosis of cervical region 05/23/2019    Class: Chronic   Status post cervical spinal fusion 05/23/2019   Chronic respiratory failure with hypoxia (HCC) 04/12/2018   Bilateral lower extremity edema  12/29/2017   Hymenoptera allergy 11/10/2017   Family history of colonic polyps 08/07/2017   Myofascial pain 08/06/2017   Seasonal and perennial allergic rhinitis 04/23/2017   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/2017   Cervicalgia 04/01/2017   Recurrent pneumonia 03/09/2017   Other spondylosis with radiculopathy, cervical region 02/09/2017   Depression with anxiety 01/04/2017   Memory difficulty 12/05/2016   Migraine without aura and without status migrainosus, not intractable 10/30/2016   Lumbar radiculopathy 05/15/2016   Osteoarthritis of spine with radiculopathy, lumbar region 05/15/2016   Risk for falls 05/15/2016   Recurrent infections 03/29/2016   Chronic nonseasonal allergic rhinitis due to pollen 03/29/2016   Polypharmacy 01/16/2016   Morbid obesity with BMI of 45.0-49.9, adult ([place]) 03/06/2015   SDAT 02/05/2015   OSA and COPD overlap syndrome ([place]) 02/05/2015   Medication management 08/02/2014   GERD (gastroesophageal reflux disease) 05/09/2014   Vitamin D deficiency 08/01/2013   Prediabetes 08/01/2013   Positive TB test 07/29/2011   Diverticula of colon 05/07/2011   Hypertension 01/31/2011   Hyperlipidemia, mixed 01/31/2011   BPH (benign prostatic hyperplasia) 01/31/2011   Testosterone Deficiency 01/31/2011   IBS (irritable bowel syndrome) 01/31/2011   Partial complex seizure disorder with intractable epilepsy ([place]) 01/31/2011   Depression, major, recurrent, in partial remission ([place]) 01/31/2011   Asthma-COPD overlap syndrome 01/31/2011    History obtained from: chart review and patient.  Mark Harrell is a 62 y.o. male presenting for a follow up visit.  He was last seen in October 2023.  At that time, he was continued on doxycycline 100 mg twice daily for infection prophylaxis due to his history of B-cell memory defect and specific antibody deficiency.  For his asthma COPD overlap, his lung testing looked a little bit better.  We talked about adding Te[place]sp[place] to help with his pulmonary  status.  We continue with Trelegy 1 puff once daily as well as albuterol as needed.  For his allergic rhinitis, we continue with Dymista 2 sprays per nostril twice daily as well as cetirizine and montelukast.  We ordered a sinus CT to look at his nasal anatomy. Sinus CT was approved and performed in November 2023.  It was completely unremarkable.  Since last visit, he has not been great.  Asthma/Respiratory Symptom History: He remains on the Trelegy 1 puff once daily.  This seems to be working fairly well for his asthma. He has been using his rescue inhaler a bit. This is a bit more often since the bronchoscopy took place. He has been using his nebulizer, but just over the last week or so.  He has not been on prednisone.  The emergency room.  He was last seen by Dr. Lamonte Sakai in December 2023.  At that time, he was continued on Trelegy and albuterol.  He was no longer on CPAP and sleeping with oxygen 1.5 L/min.  For his abnormal chest CT, he had biopsies of his right basilar nodule in the mid right lower lobe which were both negative on cytology.  His bronchoscopy was difficult due to its location.  Cultures were negative.  They are following up with repeat imaging  in 3 months, which will be around Sep 03, 2022. Dr. Lamonte Sakai is going to get this at the end of February (scheduled for February 16th).  Per the patient, Dr. Lamonte Sakai does not know what going on in his lungs. He apparently listed several things but he was not entirely sure. He reports that he is still recovering. He occasionally has some bloody discharge from his right nostril. It seems that this is stopped up the majority of the time.   Allergic Rhinitis Symptom History: He is using his nose spray routinely. He gets congested frequently. He did have the sinus CT that was normal. He reports clogging up frequently. It goes from clear to totally stopped up and then back within moments. He is using the nose spray twice daily.  He is using Afrin at night which  is helping somewhat. He is mostly using the saline rinse the bronchoscopy took place.   He has not had breakthrough infections at all.  He remains on the doxycycline twice a day.  He has not remember if he was put on something after his bronchoscopy, but again briefly infections.  GERD Symptom History: Reflux has been well controlled. He is on the famotidine BID as needed. It is infrequent that he does not bother taking anything daily.  He sees Dr[facility] Logan B[facility] with [facility]. He was on Ozempic prior to his bronchoscopy. He was on it for 3-4 months. He is going to see Dr. Lissa Hoard again next week. He is getting internal hemorrhoids addressed in the office. His last colonoscopy was 09-03-18. Next one is in Sep 03, 2023.   Mother recently died in 06-04-22.  We have a long conversation about this.  Evidently, his mother had a UTI and his sister refused to allow [place] to bring him into the hospital.  His sister has the healthcare power of attorney, [place] to do anything without her clearance.  The time she got to the hospital, it was already 2 and she decompensated from a her.  Evidently, his sister also convince her hospice team to increase her morphine dose, which likely contributed to her death.    He has a pinched nerve currently. He has had a pinched nerve in the past, so he knows what it is. He is on  pregabalin for neuropathy. He does not want to be on opioids.   Otherwise, there have been no changes to his past medical history, surgical history, family history, or social history.    Review of Systems  Constitutional: Negative.  Negative for chills, fever, malaise/fatigue and weight loss.  HENT:  Positive for congestion. Negative for ear discharge, ear pain and sinus pain.   Eyes:  Negative for pain, discharge and redness.  Respiratory:  Negative for cough, sputum production, shortness of breath and wheezing.   Cardiovascular: Negative.  Negative for chest pain and palpitations.   Gastrointestinal:  Negative for abdominal pain, constipation, diarrhea, heartburn, nausea and vomiting.  Skin: Negative.  Negative for itching and rash.  Neurological:  Negative for dizziness and headaches.  Endo/Heme/Allergies:  Negative for environmental allergies. Does not bruise/bleed easily.       Objective:   Blood pressure 130/72, pulse 92, temperature 98.4 F (36.9 C), resp. rate 20, height 6' (1.829 m), weight (!) 330 lb (149.7 kg), SpO2 90 %. Body mass index is 44.76 kg/m.    Physical Exam Vitals reviewed.  Constitutional:      Appearance: He is well-developed. He is morbidly obese. He is not ill-appearing or  toxic-appearing.     Comments: Seems more talkative today.  HENT:     Head: Normocephalic and atraumatic.     Right Ear: Tympanic membrane, ear canal and external ear normal.     Left Ear: Tympanic membrane, ear canal and external ear normal.     Nose: No nasal deformity, septal deviation, mucosal edema or rhinorrhea.     Right Turbinates: Enlarged, swollen and pale.     Left Turbinates: Enlarged, swollen and pale.     Right Sinus: No maxillary sinus tenderness or frontal sinus tenderness.     Left Sinus: No maxillary sinus tenderness or frontal sinus tenderness.     Mouth/Throat:     Lips: Pink.     Mouth: Mucous membranes are moist. Mucous membranes are not pale and not dry.     Pharynx: Uvula midline.  Eyes:     General: Lids are normal. Allergic shiner present.        Right eye: No discharge.        Left eye: No discharge.     Conjunctiva/sclera: Conjunctivae normal.     Right eye: Right conjunctiva is not injected. No chemosis.    Left eye: Left conjunctiva is not injected. No chemosis.    Pupils: Pupils are equal, round, and reactive to light.  Cardiovascular:     Rate and Rhythm: Normal rate and regular rhythm.     Heart sounds: Normal heart sounds.  Pulmonary:     Effort: Pulmonary effort is normal. No tachypnea, accessory muscle usage or  respiratory distress.     Breath sounds: Normal breath sounds. No wheezing, rhonchi or rales.     Comments: Decreased air movement at the bases. There are no crackles or wheezing noted. Difficult to hear due to body habitus.  Chest:     Chest wall: No tenderness.  Lymphadenopathy:     Cervical: No cervical adenopathy.  Skin:    Coloration: Skin is not pale.     Findings: No abrasion, erythema, petechiae or rash. Rash is not papular, urticarial or vesicular.  Neurological:     Mental Status: He is alert.  Psychiatric:        Behavior: Behavior is cooperative.      Diagnostic studies:    Spirometry: results abnormal (FEV1: 2.03/53%, FVC: 2.96/58%, FEV1/FVC: 69%).    Spirometry consistent with possible restrictive disease.   Allergy Studies: none      [name], MD  [facility]

## 2022-06-10 NOTE — Patient Instructions (Addendum)
1. 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 - We many need to consider starting immunoglobulin replacement in the future, especially in light of the fact that you are continuing with multiple infections.  2. Asthma-COPD overlap syndrome - Lung testing looked much better today compared to the last time that I checked the breathing.  - We are going to work on getting the bottom of what is wrong with your lungs before we add an injectable medication again. - Daily controller medication(s): Trelegy one puff once daily - Prior to physical activity: albuterol 2 puffs 10-15 minutes before physical activity. - Rescue medications: albuterol 4 puffs every 4-6 hours as needed or DuoNeb nebulizer one vial every 4-6 hours as needed - Asthma control goals:  * Full participation in all desired activities (may need albuterol before activity) * Albuterol use two time or less a week on average (not counting use with activity) * Cough interfering with sleep two time or less a month * Oral steroids no more than once a year * No hospitalizations   3. Chronic allergic rhinitis (sweet vernal grass, box elder, cat, weeds, ragweed, molds, cockroach, dust mite) - Continue with aszelastine/fluticasone 2 sprays per nostril up to twice daily. - Continue with saline mist 1-2 times daily.  - Continue with cetirizine '10mg'$  daily as needed for breakthrough symptoms. - Continue with montelukast '10mg'$  in the evening.  - Sinus CT in November 2023 was normal.   4. GERD - Continue famotidine as needed.  5. Return in about 6 months (around 12/09/2022).     Please inform us of any Emergency Department visits, hospitalizations, or changes in symptoms. Call us before going to the ED for breathing or allergy symptoms since we might be able to fit you in for a sick visit. Feel free to contact us anytime with any  questions, problems, or concerns.  It was a pleasure to see you again today!  Websites that have reliable patient information: 1. American Academy of Asthma, Allergy, and Immunology: www.aaaai.org 2. Food Allergy Research and Education (FARE): foodallergy.org 3. Mothers of Asthmatics: http://www.asthmacommunitynetwork.org 4. American College of Allergy, Asthma, and Immunology: www.acaai.org   COVID-19 Vaccine Information can be found at: ShippingScam.co.uk For questions related to vaccine distribution or appointments, please email [email or web address] or call [phone].   We realize that you might be concerned about having an allergic reaction to the COVID19 vaccines. To help with that concern, WE ARE OFFERING THE COVID19 VACCINES IN OUR OFFICE! Ask the front desk for dates!     "Like" [place] on Facebook and Instagram for our latest updates!      A healthy democracy works best when [employer] participate! Make sure you are registered to vote! If you have moved or changed any of your contact information, you will need to get this updated before voting!  In some cases, you MAY be able to register to vote online: [email or web address]

## 2022-06-11 ENCOUNTER — Encounter: Payer: Self-pay | Admitting: Allergy & Immunology

## 2022-06-11 LAB — ACID FAST CULTURE WITH REFLEXED SENSITIVITIES (MYCOBACTERIA): Acid Fast Culture: NEGATIVE

## 2022-06-13 ENCOUNTER — Other Ambulatory Visit: Payer: Self-pay | Admitting: Allergy & Immunology

## 2022-06-16 ENCOUNTER — Encounter: Payer: Self-pay | Admitting: Orthopedic Surgery

## 2022-06-16 ENCOUNTER — Ambulatory Visit: Payer: BC Managed Care – PPO | Admitting: Orthopedic Surgery

## 2022-06-16 DIAGNOSIS — G5603 Carpal tunnel syndrome, bilateral upper limbs: Secondary | ICD-10-CM | POA: Diagnosis not present

## 2022-06-16 DIAGNOSIS — M79641 Pain in right hand: Secondary | ICD-10-CM | POA: Diagnosis not present

## 2022-06-16 DIAGNOSIS — M79642 Pain in left hand: Secondary | ICD-10-CM

## 2022-06-17 ENCOUNTER — Encounter: Payer: Self-pay | Admitting: Orthopedic Surgery

## 2022-06-17 NOTE — Progress Notes (Signed)
Office Visit Note   Patient: Mark Harrell.           Date of Birth: [birth year]           MRN: [id number] Visit Date: 06/16/2022              Requested by: [name], [address] [place] [place],  [place] [address] PCP: [name], MD  No chief complaint on file.     HPI: Patient is a 62 year old gentleman who presents complaining of bilateral hand pain symmetric.  Patient states that he has numbness on the palmar aspect of the thumb and index finger.  Patient states this does not wake him up at night.  Symptoms are constant.  Patient denies triggering of his thumbs.  Patient denies cervical radicular pain.  Assessment & Plan: Visit Diagnoses:  1. Bilateral hand pain   2. Bilateral carpal tunnel syndrome     Plan: Will have patient use short arm Velcro night splints and he may use during the day if this is helpful.  Recommended Voltaren gel.  Follow-Up Instructions: No follow-ups on file.   Ortho Exam  Patient is alert, oriented, no adenopathy, well-dressed, normal affect, normal respiratory effort. Examination patient has no triggering of his fingers bilaterally the first dorsal extensor compartment is nontender to palpation bilaterally negative Finkelstein's test.  He has a negative Phalen's negative Tinel's sign.  Grip strength is equal but weak bilaterally.  He has some mild pain at the base of the thumb bilaterally.  Imaging: No results found. No images are attached to the encounter.  Labs: Lab Results  Component Value Date   HGBA1C 5.4 06/05/2022   HGBA1C 5.3 02/18/2022   HGBA1C 5.8 (H) 10/04/2021   ESRSEDRATE 40 (H) 05/26/2019   ESRSEDRATE 7 07/29/2011   ESRSEDRATE 11 05/07/2011   CRP 1.1 (H) 10/05/2021   CRP 1.4 (H) 10/04/2021   CRP 1.03 (H) 05/07/2011   REPTSTATUS 04/30/2022 FINAL 04/28/2022   REPTSTATUS 05/03/2022 FINAL 04/28/2022   GRAMSTAIN NO WBC SEEN NO ORGANISMS SEEN  04/28/2022   GRAMSTAIN NO WBC SEEN NO ORGANISMS  SEEN  04/28/2022   CULT  04/28/2022    NO GROWTH 2 DAYS Performed at [facility], [place] [address] [place], [place] [address]    CULT  04/28/2022    No growth aerobically or anaerobically. Performed at [facility], [place] [address], [place],  [address]    LABORGA NO GROWTH 07/24/2016     Lab Results  Component Value Date   ALBUMIN 3.2 (L) 10/08/2021   ALBUMIN 3.2 (L) 10/07/2021   ALBUMIN 3.4 (L) 10/06/2021    Lab Results  Component Value Date   MG 2.1 02/18/2022   MG 2.3 10/08/2021   MG 2.1 10/07/2021   Lab Results  Component Value Date   VD25OH 100 06/05/2022   VD25OH 127 (H) 02/18/2022   VD25OH 110 (H) 01/09/2021    No results found for: "PREALBUMIN"    Latest Ref Rng & Units 06/05/2022    4:52 PM 04/28/2022    6:57 AM 03/04/2022    5:37 PM  CBC EXTENDED  WBC 3.8 - 10.8 Thousand/uL 8.9  13.1  8.9   RBC 4.20 - 5.80 Million/uL 4.92  4.97  5.26   Hemoglobin 13.2 - 17.1 g/dL 14.4  14.9  15.2   HCT 38.5 - 50.0 % 44.1  45.1  45.6   Platelets 140 - 400 Thousand/uL 284  255  NEUT# 1,500 - 7,800 cells/uL 5,109   4.8   Lymph# 850 - 3,900 cells/uL 2,448   2.9      There is no height or weight on file to calculate BMI.  Orders:  No orders of the defined types were placed in this encounter.  No orders of the defined types were placed in this encounter.    Procedures: No procedures performed  Clinical Data: No additional findings.  ROS:  All other systems negative, except as noted in the HPI. Review of Systems  Objective: Vital Signs: There were no vitals taken for this visit.  Specialty Comments:  No specialty comments available.  PMFS History: Patient Active Problem List   Diagnosis Date Noted   Unilateral primary osteoarthritis, left knee 02/24/2022   Unilateral primary osteoarthritis, right knee 02/24/2022   Abnormal CT of the chest 01/28/2022   Hypokalemia 09/26/2021   Dyspnea 09/25/2021   Acute deep vein thrombosis (DVT)  of right lower extremity ([place]) 09/24/2021   OSA (obstructive sleep apnea) 09/24/2021   Herniation of cervical intervertebral disc with radiculopathy    Status post cervical discectomy 08/20/2020   Tracheomalacia 05/26/2019   Acquired tracheomalacia 05/26/2019   HNP (herniated nucleus pulposus) with myelopathy, cervical 05/23/2019    Class: Chronic   Spinal stenosis of cervical region 05/23/2019    Class: Chronic   Status post cervical spinal fusion 05/23/2019   Chronic respiratory failure with hypoxia ([place]) 04/12/2018   Bilateral lower extremity edema 12/29/2017   Hymenoptera allergy 11/10/2017   Family history of colonic polyps 08/07/2017   Myofascial pain 08/06/2017   Seasonal and perennial allergic rhinitis 04/23/2017   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/2017   Cervicalgia 04/01/2017   Recurrent pneumonia 03/09/2017   Other spondylosis with radiculopathy, cervical region 02/09/2017   Depression with anxiety 01/04/2017   Memory difficulty 12/05/2016   Migraine without aura and without status migrainosus, not intractable 10/30/2016   Lumbar radiculopathy 05/15/2016   Osteoarthritis of spine with radiculopathy, lumbar region 05/15/2016   Risk for falls 05/15/2016   Recurrent infections 03/29/2016   Chronic nonseasonal allergic rhinitis due to pollen 03/29/2016   Polypharmacy 01/16/2016   Morbid obesity with BMI of 45.0-49.9, adult ([place]) 03/06/2015   SDAT 02/05/2015   OSA and COPD overlap syndrome ([facility]) 02/05/2015   Medication management 08/02/2014   GERD (gastroesophageal reflux disease) 05/09/2014   Vitamin D deficiency 08/01/2013   Prediabetes 08/01/2013   Positive TB test 07/29/2011   Diverticula of colon 05/07/2011   Hypertension 01/31/2011   Hyperlipidemia, mixed 01/31/2011   BPH (benign prostatic hyperplasia) 01/31/2011   Testosterone Deficiency 01/31/2011   IBS (irritable bowel syndrome) 01/31/2011   Partial complex seizure disorder with intractable epilepsy ([place]) 01/31/2011    Depression, major, recurrent, in partial remission ([place]) 01/31/2011   Asthma-COPD overlap syndrome 01/31/2011   Past Medical History:  Diagnosis Date   Anesthesia complication requiring reversal agent administration    ? from central apnea, very difficult to get off vent   Anxiety    Arthritis    osteo   Asthma    BPH (benign prostatic hyperplasia)    Complication of anesthesia    difficulty waking , they twlight me because of my respiratory problems "   COPD (chronic obstructive pulmonary disease) ([place])    COVID    moderate case 2023   Depression    DVT (deep venous thrombosis) ([place]) 2023   right leg   Dyspnea    on exertion   Enlarged heart  Family history of adverse reaction to anesthesia    mother trouble waking up, and heart stopped   GERD (gastroesophageal reflux disease)    Headache    botox injections for headaches   Hyperlipidemia    Hypertension    Hypogonadism male    IBS (irritable bowel syndrome)    Memory difficulties    short term memories   Neuropathy    Obesity    On home oxygen therapy    on 2 liter   OSA (obstructive sleep apnea)    not using cpap   Paralysis (H[facility])    left hand - small    due to accident with an head injury   Pneumonia    Pre-diabetes    Prostatitis     Family History  Problem Relation Age of Onset   Diabetes Paternal Uncle    Cancer Father        lymphoma, colon   Diabetes Maternal Grandmother    Heart disease Maternal Grandfather    Diabetes Maternal Grandfather    Diabetes Paternal Grandmother    Diabetes Paternal Grandfather    Dementia Mother    Prostate cancer Maternal Uncle    Lung disease Neg Hx    Rheumatologic disease Neg Hx     Past Surgical History:  Procedure Laterality Date   ABDOMINAL SURGERY     ANKLE FRACTURE SURGERY Right    ANTERIOR CERVICAL DECOMP/DISCECTOMY FUSION N/A 05/23/2019   Procedure: ANTERIOR CERVICAL DISCECTOMY FUSION CERVICAL FIVE THROUGH CERVICAL SIX AND CERVICAL SIX THROUGH  CERVICAL SEVEN;  Surgeon: Jessy Oto, MD;  Location: [facility];  Service: Orthopedics;  Laterality: N/A;   BRONCHIAL BRUSHINGS  04/28/2022   Procedure: BRONCHIAL BRUSHINGS;  Surgeon: Collene Gobble, MD;  Location: [facility] ENDOSCOPY;  Service: Pulmonary;;   BRONCHIAL NEEDLE ASPIRATION BIOPSY  04/28/2022   Procedure: BRONCHIAL NEEDLE ASPIRATION BIOPSIES;  Surgeon: Collene Gobble, MD;  Location: [facility];  Service: Pulmonary;;   COLONOSCOPY     CYSTOSCOPY     [place]   KNEE ARTHROSCOPY WITH MEDIAL MENISECTOMY Left 01/02/2017   Procedure: LEFT KNEE ARTHROSCOPY WITH PARTIAL MEDIAL MENISCECTOMY;  Surgeon: Mcarthur Rossetti, MD;  Location: [facility];  Service: Orthopedics;  Laterality: Left;   POSTERIOR CERVICAL FUSION/FORAMINOTOMY N/A 08/20/2020   Procedure: LEFT CERVICAL SIX THROUGH SEVEN  AND CERVICAL SEVEN THROUGH THORACIC ONE  FORAMINOTOMIES WITH EXCISION OF [place] HERNIATION LEFT CERVICAL SEVEN THROUGH THORACIC ONE;  Surgeon: Jessy Oto, MD;  Location: [facility];  Service: Orthopedics;  Laterality: N/A;   TONSILLECTOMY     TURBINATE RESECTION  2007   UVULOPALATOPHARYNGOPLASTY     VIDEO BRONCHOSCOPY WITH RADIAL ENDOBRONCHIAL ULTRASOUND  04/28/2022   Procedure: VIDEO BRONCHOSCOPY WITH RADIAL ENDOBRONCHIAL ULTRASOUND;  Surgeon: Collene Gobble, MD;  Location: [facility] ENDOSCOPY;  Service: Pulmonary;;   Social History   Occupational History   Occupation: Dance movement psychotherapist  Tobacco Use   Smoking status: Former    Packs/day: 0.10    Years: 15.00    Total pack years: 1.50    Types: Cigarettes    Quit date: 05/27/1983    Years since quitting: 39.0    Passive exposure: Past   Smokeless tobacco: Never   Tobacco comments:    significant second-hand exposure through mother  Vaping Use   Vaping Use: Never used  Substance and Sexual Activity   Alcohol use: Yes    Alcohol/week: 1.0 standard drink of alcohol    Types: 1 Glasses of wine per week  Comment: 2 x a year   Drug use: No   Sexual  activity: Never    Birth control/protection: None

## 2022-06-18 DIAGNOSIS — Z8719 Personal history of other diseases of the digestive system: Secondary | ICD-10-CM | POA: Diagnosis not present

## 2022-06-18 DIAGNOSIS — K648 Other hemorrhoids: Secondary | ICD-10-CM | POA: Diagnosis not present

## 2022-06-18 DIAGNOSIS — Z86010 Personal history of colonic polyps: Secondary | ICD-10-CM | POA: Diagnosis not present

## 2022-06-23 DIAGNOSIS — G43719 Chronic migraine without aura, intractable, without status migrainosus: Secondary | ICD-10-CM | POA: Diagnosis not present

## 2022-06-24 DIAGNOSIS — G43719 Chronic migraine without aura, intractable, without status migrainosus: Secondary | ICD-10-CM | POA: Diagnosis not present

## 2022-07-06 ENCOUNTER — Other Ambulatory Visit: Payer: Self-pay | Admitting: Nurse Practitioner

## 2022-07-06 ENCOUNTER — Other Ambulatory Visit: Payer: Self-pay | Admitting: Internal Medicine

## 2022-07-06 DIAGNOSIS — S161XXD Strain of muscle, fascia and tendon at neck level, subsequent encounter: Secondary | ICD-10-CM

## 2022-07-07 ENCOUNTER — Telehealth: Payer: Self-pay | Admitting: Emergency Medicine

## 2022-07-07 NOTE — Telephone Encounter (Signed)
Due to insurance benefits we had to sch this pt CT for [facility], they were booked out til 3/11 at 2:40pm   Wanted to ask if Byrum would like to still see him on 2/23 or go ahead and resch his f/u after 3/11.Marland Kitchenasking for confirmation since its a little far out   Thanks

## 2022-07-08 NOTE — Telephone Encounter (Signed)
Mark Harrell I'm not seeing anything til 3/26 for RB - do you have the ability to schedule something sooner on your end?   Thanks in advance

## 2022-07-08 NOTE — Telephone Encounter (Signed)
Yes, I agree that we should schedule his follow up after the CT is done.

## 2022-07-10 ENCOUNTER — Ambulatory Visit: Payer: BC Managed Care – PPO | Admitting: Podiatry

## 2022-07-10 ENCOUNTER — Encounter: Payer: Self-pay | Admitting: Podiatry

## 2022-07-10 DIAGNOSIS — B351 Tinea unguium: Secondary | ICD-10-CM | POA: Diagnosis not present

## 2022-07-10 DIAGNOSIS — M79676 Pain in unspecified toe(s): Secondary | ICD-10-CM

## 2022-07-10 DIAGNOSIS — M7751 Other enthesopathy of right foot: Secondary | ICD-10-CM | POA: Diagnosis not present

## 2022-07-10 DIAGNOSIS — D2371 Other benign neoplasm of skin of right lower limb, including hip: Secondary | ICD-10-CM

## 2022-07-10 DIAGNOSIS — M722 Plantar fascial fibromatosis: Secondary | ICD-10-CM

## 2022-07-10 DIAGNOSIS — D2372 Other benign neoplasm of skin of left lower limb, including hip: Secondary | ICD-10-CM

## 2022-07-10 MED ORDER — DEXAMETHASONE SODIUM PHOSPHATE 120 MG/30ML IJ SOLN
2.0000 mg | Freq: Once | INTRAMUSCULAR | Status: AC
  Administered 2022-07-10: 2 mg via INTRA_ARTICULAR

## 2022-07-10 MED ORDER — TRIAMCINOLONE ACETONIDE 40 MG/ML IJ SUSP
40.0000 mg | Freq: Once | INTRAMUSCULAR | Status: AC
  Administered 2022-07-10: 40 mg

## 2022-07-10 NOTE — Telephone Encounter (Signed)
Unfortunately not without Dr. Agustina Caroli approval.

## 2022-07-10 NOTE — Telephone Encounter (Signed)
Ok to put him in a blocked slot after the Ct is done.

## 2022-07-10 NOTE — Progress Notes (Signed)
Presents today for follow-up of his [place] fasciitis bilateral painful area beneath the fifth metatarsal head of the right foot.  He is also here to get his nails debrided.  Objective: Vital signs are stable oriented x 3 there is no erythema edema salines drainage or he is fluctuance beneath fifth metatarsal head of the right foot with an overlying porokeratotic lesion.  Toenails are long thick and dystrophic.  He also has pain on palpation medial calcaneal tubercles bilateral.  Assessment: Bursitis subfifth met right.  Porokeratotic lesion benign skin lesion subfifth met right.  Toenails are long thick yellow dystrophic and mycotic send painful mycotic nails.  Plan fasciitis bilateral.  Plan: Injected bilateral heels today 20 mg Kenalog 5 mg Marcaine point maximal tenderness.  Also injected subfifth metatarsal lesion with dexamethasone local anesthetic.  Debrided nails 1 through 5 bilateral debrided benign skin lesion.  Follow-up with him in 3 months

## 2022-07-11 ENCOUNTER — Ambulatory Visit (HOSPITAL_COMMUNITY): Payer: BC Managed Care – PPO

## 2022-07-11 NOTE — Telephone Encounter (Signed)
Left voicemail for patient to call back to move follow up with Dr. Lamonte Sakai up to 3/19 in a held spot.

## 2022-07-18 ENCOUNTER — Ambulatory Visit: Payer: BC Managed Care – PPO | Admitting: Emergency Medicine

## 2022-07-25 NOTE — Telephone Encounter (Signed)
Left voicemail for patient to call back if he wishes to move follow up appointment up and sent mychart. Closing encounter.

## 2022-07-26 ENCOUNTER — Other Ambulatory Visit: Payer: Self-pay | Admitting: Allergy & Immunology

## 2022-08-04 ENCOUNTER — Ambulatory Visit
Admission: RE | Admit: 2022-08-04 | Discharge: 2022-08-04 | Disposition: A | Discharge status: Home / Self Care | Payer: BC Managed Care – PPO | Source: Ambulatory Visit | Attending: Emergency Medicine | Admitting: Emergency Medicine

## 2022-08-04 DIAGNOSIS — R911 Solitary pulmonary nodule: Secondary | ICD-10-CM | POA: Diagnosis not present

## 2022-08-04 DIAGNOSIS — R918 Other nonspecific abnormal finding of lung field: Secondary | ICD-10-CM

## 2022-08-06 ENCOUNTER — Other Ambulatory Visit: Payer: Self-pay | Admitting: Internal Medicine

## 2022-08-06 DIAGNOSIS — K5730 Diverticulosis of large intestine without perforation or abscess without bleeding: Secondary | ICD-10-CM | POA: Diagnosis not present

## 2022-08-06 DIAGNOSIS — R140 Abdominal distension (gaseous): Secondary | ICD-10-CM | POA: Diagnosis not present

## 2022-08-06 DIAGNOSIS — K648 Other hemorrhoids: Secondary | ICD-10-CM | POA: Diagnosis not present

## 2022-08-06 DIAGNOSIS — R197 Diarrhea, unspecified: Secondary | ICD-10-CM | POA: Diagnosis not present

## 2022-08-06 DIAGNOSIS — S161XXD Strain of muscle, fascia and tendon at neck level, subsequent encounter: Secondary | ICD-10-CM

## 2022-08-17 ENCOUNTER — Other Ambulatory Visit: Payer: Self-pay | Admitting: Allergy & Immunology

## 2022-08-17 ENCOUNTER — Other Ambulatory Visit: Payer: Self-pay | Admitting: Orthopaedic Surgery

## 2022-08-19 ENCOUNTER — Ambulatory Visit: Payer: BC Managed Care – PPO | Admitting: Emergency Medicine

## 2022-08-19 ENCOUNTER — Encounter: Payer: Self-pay | Admitting: Emergency Medicine

## 2022-08-19 VITALS — BP 124/72 | HR 85 | Temp 98.4°F | Ht 72.0 in | Wt 338.4 lb

## 2022-08-19 DIAGNOSIS — J4489 Other specified chronic obstructive pulmonary disease: Secondary | ICD-10-CM | POA: Diagnosis not present

## 2022-08-19 DIAGNOSIS — R9389 Abnormal findings on diagnostic imaging of other specified body structures: Secondary | ICD-10-CM | POA: Diagnosis not present

## 2022-08-19 DIAGNOSIS — G4733 Obstructive sleep apnea (adult) (pediatric): Secondary | ICD-10-CM

## 2022-08-19 NOTE — Addendum Note (Signed)
Addended by: Dierdre Highman on: 08/19/2022 04:18 PM   Modules accepted: Orders

## 2022-08-19 NOTE — Assessment & Plan Note (Signed)
Overall doing okay although he has significant decreased energy.  He question whether he may have long COVID which is certainly possible.  He is not having overt bronchitic symptoms or wheeze.  Plan to continue his Trelegy, albuterol as needed.

## 2022-08-19 NOTE — Progress Notes (Signed)
Subjective:    Patient ID: Mark Harrell., male    DOB: 06/11/1960, 62 y.o.   MRN: [id number]  HPI  ROV 05/09/22 --Mark Harrell is 62, has a history of fixed asthma/COPD, minimal tobacco history, OSA, DVT, COVID-pneumonia in early 2023.  I have seen him for abnormal CT scan of the chest that showed bilateral rounded lower lobe opacities, question some mild associated hypermetabolism on PET scan 03/20/2022.  He went for bronchoscopy on 04/28/2022, somewhat technically difficult due to his body habitus and the location of the nodules.  The procedure was done in the left lateral decubitus position.  His cytologies from transbronchial biopsies were all negative.  AFB and fungal smears are negative, cultures all pending.  ROV 08/19/22 --62 year old man with a history of COPD/mixed asthma and minimal tobacco history.  Past medical history also significant for OSA, DVT, COVID-pneumonia in early 2023.  I have followed him for bilateral rounded lower lobe opacities with some borderline hypermetabolism on PET (02/2022).  He underwent bronchoscopy on 04/28/2022 that was technically challenging due to his body habitus and nodule location.  Cytologies were all negative, cultures negative as well.  Repeat CT 08/04/2022 as below He is having a lot of fatigue, no energy to do his daily activities. Not on CPAP. He is on Trelegy, occasional SABA use.   CT scan of the chest 08/04/2022 reviewed by me, shows overall stable findings.  There is no mediastinal or hilar adenopathy, clustered nodularity in the right lower lobe is unchanged to smaller, 4.9 x 0.6 cm.  The dominant right lower lobe paraspinal nodule is also smaller 2.7 x 1.4 cm.  There is subsegmental left basilar atelectasis that is unchanged.   Review of Systems As per HPI  Past Medical History:  Diagnosis Date   Anesthesia complication requiring reversal agent administration    ? from central apnea, very difficult to get off vent   Anxiety    Arthritis    osteo    Asthma    BPH (benign prostatic hyperplasia)    Complication of anesthesia    difficulty waking , they twlight me because of my respiratory problems "   COPD (chronic obstructive pulmonary disease) (HCC)    COVID    moderate case 2023   Depression    DVT (deep venous thrombosis) ([place]) 2023   right leg   Dyspnea    on exertion   Enlarged heart    Family history of adverse reaction to anesthesia    mother trouble waking up, and heart stopped   GERD (gastroesophageal reflux disease)    Headache    botox injections for headaches   Hyperlipidemia    Hypertension    Hypogonadism male    IBS (irritable bowel syndrome)    Memory difficulties    short term memories   Neuropathy    Obesity    On home oxygen therapy    on 2 liter   OSA (obstructive sleep apnea)    not using cpap   Paralysis (HCC)    left hand - small    due to accident with an head injury   Pneumonia    Pre-diabetes    Prostatitis      Family History  Problem Relation Age of Onset   Diabetes Paternal Uncle    Cancer Father        lymphoma, colon   Diabetes Maternal Grandmother    Heart disease Maternal Grandfather    Diabetes Maternal Grandfather  Diabetes Paternal Grandmother    Diabetes Paternal Grandfather    Dementia Mother    Prostate cancer Maternal Uncle    Lung disease Neg Hx    Rheumatologic disease Neg Hx      Social History   Socioeconomic History   Marital status: Single    Spouse name: Not on file   Number of children: Not on file   Years of education: Not on file   Highest education level: Not on file  Occupational History   Occupation: Dance movement psychotherapist  Tobacco Use   Smoking status: Former    Packs/day: 0.10    Years: 15.00    Additional pack years: 0.00    Total pack years: 1.50    Types: Cigarettes    Quit date: 05/27/1983    Years since quitting: 39.2    Passive exposure: Past   Smokeless tobacco: Never   Tobacco comments:    significant second-hand exposure  through mother  Vaping Use   Vaping Use: Never used  Substance and Sexual Activity   Alcohol use: Yes    Alcohol/week: 1.0 standard drink of alcohol    Types: 1 Glasses of wine per week    Comment: 2 x a year   Drug use: No   Sexual activity: Never    Birth control/protection: None  Other Topics Concern   Not on file  Social History Narrative   [facility]:   Originally from [place]. Previously has lived in [place]. He has lived in [place], [place], & [place]. He has worked in Engineer, production. No pets currently. Brief exposure to a roommates bird Secretary/administrator) in college. No mold, asbestos, or hot tub exposure.    Social Determinants of Health   Financial Resource Strain: Not on file  Food Insecurity: No Food Insecurity (10/08/2021)   Hunger Vital Sign    Worried About Running Out of Food in the Last Year: Never true    Ran Out of Food in the Last Year: Never true  Transportation Needs: No Transportation Needs (10/08/2021)   PRAPARE - Hydrologist (Medical): No    Lack of Transportation (Non-Medical): No  Physical Activity: Not on file  Stress: Not on file  Social Connections: Not on file  Intimate Partner Violence: Not on file     Allergies  Allergen Reactions   Bee Venom Swelling   Cymbalta [Duloxetine Hcl] Shortness Of Breath    Brought on asthma   Ppd [Tuberculin Purified Protein Derivative] Other (See Comments)    +ppd NEG Quantferron Gold 3/13 (shows false positive)    Calan [Verapamil] Other (See Comments)    Back pain   Tricor [Fenofibrate] Other (See Comments)    Back pain   Claritin [Loratadine] Other (See Comments)    Unknown reaction    Levaquin [Levofloxacin] Diarrhea     Outpatient Medications Prior to Visit  Medication Sig Dispense Refill   acetaminophen (TYLENOL) 500 MG tablet Take 1,000 mg by mouth 3 (three) times daily.     anastrozole (ARIMIDEX) 1 MG tablet Take 1 mg by mouth daily.     arformoterol  (BROVANA) 15 MCG/2ML NEBU Take 2 mLs (15 mcg total) by nebulization in the morning and at bedtime. 120 mL 5   ascorbic acid (VITAMIN C) 1000 MG tablet Take 1,000 mg by mouth every evening.     aspirin EC 81 MG tablet Take 81 mg by mouth daily. Swallow whole.     Azelastine HCl  137 MCG/SPRAY SOLN USE 1 TO 2 SPRAYS EACH NOSTRIL 2 X /DAY 90 mL 1   benzonatate (TESSALON) 200 MG capsule Take 1 capsule 3 x /day to Prevent Cough                                                     /                                  TAKE                               BY                              MOUTH 90 capsule 1   Botulinum Toxin Type A 200 units SOLR Inject 200 Units into the skin every 3 (three) months.     budesonide (PULMICORT) 0.5 MG/2ML nebulizer solution Take 2 mLs (0.5 mg total) by nebulization 2 (two) times daily.     Calcium Carb-Cholecalciferol (CALCIUM + VITAMIN D3 PO) Take 1 tablet by mouth daily.     celecoxib (CELEBREX) 200 MG capsule TAKE 1 CAPSULE (200 MG TOTAL) BY MOUTH EVERY 12 (TWELVE) HOURS. 60 capsule 2   cetaphil (CETAPHIL) lotion Apply 1 Application topically 2 (two) times daily.     cetirizine (ZYRTEC) 10 MG tablet Take 1 tablet (10 mg total) by mouth in the morning.     Cholecalciferol (VITAMIN D3) 125 MCG (5000 UT) TABS Take 5,000-10,000 Units by mouth See admin instructions. Take 10,000 in the morning and 5,000 units in the evening     Cinnamon 500 MG capsule Take 1,000 mg by mouth 3 (three) times daily.     Coenzyme Q10 (CO Q 10 PO) Take 600 mg by mouth daily.     cyclobenzaprine (FLEXERIL) 10 MG tablet TAKE 1/2 TO 1 TABLET 3 TIMES A DAY ONLY IF NEED FOR MUSCLE SPASM (NECK) 30 tablet 0   diclofenac Sodium (VOLTAREN) 1 % GEL Apply 2-4 g topically 2 (two) times daily as needed (pain).     donepezil (ARICEPT) 23 MG TABS tablet Take 1 tablet Daily for Memory 90 tablet 3   doxycycline (ADOXA) 100 MG tablet TAKE 1 TABLET BY MOUTH TWICE A DAY 60 tablet 3   EPINEPHrine 0.3 mg/0.3 mL IJ SOAJ  injection Inject 0.3 mg into the muscle as needed for anaphylaxis.     fluticasone (FLONASE) 50 MCG/ACT nasal spray Place 2 sprays into both nostrils 2 (two) times daily.     furosemide (LASIX) 80 MG tablet Take 80 mg by mouth daily as needed for edema.     Guaifenesin 1200 MG TB12 Take 1,200 mg by mouth 3 (three) times daily.     ipratropium-albuterol (DUONEB) 0.5-2.5 (3) MG/3ML SOLN Take 3 mLs by nebulization 2 (two) times daily.     KLOR-CON M20 20 MEQ tablet TAKE 1 TABLET TWICE DAILY FOR POTASSIUM 180 tablet 3   Krill Oil 500 MG CAPS Take 500 mg by mouth 2 (two) times daily.     Magnesium 500 MG CAPS Take 500 mg by mouth 3 (three) times daily.     memantine (NAMENDA) 10  MG tablet Take 10 mg by mouth 2 (two) times daily.     metFORMIN (GLUCOPHAGE-XR) 500 MG 24 hr tablet TAKES 2 TABLETS 2 X /DAY WITH MEALS FOR DIABETES 360 tablet 3   metoprolol tartrate (LOPRESSOR) 25 MG tablet Take  1 tablet  2 x /day (every 12 hours)  for BP                                           /                               TAKE                 BY                  MOUTH 180 tablet 3   Misc Natural Products (GLUCOS-CHONDROIT-MSM COMPLEX) TABS Take 2 tablets by mouth 3 (three) times daily.     montelukast (SINGULAIR) 10 MG tablet TAKE 1 TABLET BY MOUTH DAILY FOR ALLERGIES 90 tablet 1   Multiple Vitamins-Minerals (MULTIVITAMIN WITH MINERALS) tablet Take 1 tablet by mouth every morning.     Multiple Vitamins-Minerals (PRESERVISION AREDS 2) CAPS Take 1 capsule by mouth 2 (two) times daily. Evening and night     oxymetazoline (AFRIN) 0.05 % nasal spray Place 1 spray into both nostrils at bedtime.     pentosan polysulfate (ELMIRON) 100 MG capsule Take 100 mg by mouth 3 (three) times daily.     pravastatin (PRAVACHOL) 40 MG tablet TAKE 1 TABLET BY MOUTH EVERY DAY AT BEDTIME FOR CHOLESTEROL 30 tablet 11   pregabalin (LYRICA) 150 MG capsule TAKE 1 CAPSULE 3 TIMES A DAY FOR CHRONIC PAIN 270 capsule 0   PROBIOTIC PRODUCT PO Take 1  capsule by mouth 2 (two) times daily. VSL     promethazine-dextromethorphan (PROMETHAZINE-DM) 6.25-15 MG/5ML syrup Take 5 mLs by mouth 4 (four) times daily as needed for cough.     Semaglutide, 2 MG/DOSE, (OZEMPIC, 2 MG/DOSE,) 8 MG/3ML SOPN Inject  2 mg  into Skin every 7 days for Diabetes  (E11.29) 9 mL 3   sertraline (ZOLOFT) 100 MG tablet Take 1 tablet (100 mg total) by mouth 2 (two) times daily. for Mood / Depression     sodium chloride (OCEAN) 0.65 % SOLN nasal spray Place 1 spray into both nostrils 4 (four) times daily as needed for congestion. Uses each time before other nasal sprays     tadalafil (CIALIS) 5 MG tablet Take 5 mg by mouth daily.      TRELEGY ELLIPTA 100-62.5-25 MCG/ACT AEPB Inhale 1 puff into the lungs daily.     trospium (SANCTURA) 20 MG tablet Take 20 mg by mouth 2 (two) times daily. In the evening and at bedtime     TURMERIC PO Take 2,000 mg by mouth 3 (three) times daily. 1000 mg each     VENTOLIN HFA 108 (90 Base) MCG/ACT inhaler INHALE 2 PUFFS BY MOUTH EVERY 4 HOURS AS NEEDED FOR WHEEZE OR FOR SHORTNESS OF BREATH 18 each 1   zinc gluconate 50 MG tablet Take 50 mg by mouth 3 (three) times daily.     No facility-administered medications prior to visit.        Objective:   Physical Exam  Vitals:   08/19/22 1535  BP: 124/72  Pulse: 85  Temp: 98.4 F (36.9 C)  TempSrc: Oral  SpO2: 98%  Weight: (!) 338 lb 6.4 oz (153.5 kg)  Height: 6' (1.829 m)   Gen: Pleasant, obese, in no distress,  normal affect  ENT: No lesions,  mouth clear,  oropharynx clear, no postnasal drip  Neck: No JVD, no stridor  Lungs: No use of accessory muscles, no crackles or wheezing on normal respiration, no wheeze on forced expiration  Cardiovascular: RRR, heart sounds normal, no murmur or gallops, trace peripheral edema  Musculoskeletal: No deformities, no cyanosis or clubbing  Neuro: alert, awake, non focal  Skin: Warm, no lesions or rash      Assessment & Plan:    Asthma-COPD overlap syndrome (HCC) Overall doing okay although he has significant decreased energy.  He question whether he may have long COVID which is certainly possible.  He is not having overt bronchitic symptoms or wheeze.  Plan to continue his Trelegy, albuterol as needed.  OSA (obstructive sleep apnea) Unable to tolerate CPAP due to night terrors.  He is interested in possible inspire device but knows that he will need to get to target weight in order to be a candidate.  Abnormal CT of the chest Bilateral macro and micronodular disease, unchanged to improved on his most recent CT chest 08/04/2022.  He had a reassuring bronchoscopy 04/2022.  We will plan to repeat his CT in 6 months, September 2024.  If there is an interval change then we would consider repeat bronchoscopy or other means of biopsy given the difficult location of his predominant nodules   Baltazar Apo, MD, PhD 08/19/2022, 4:13 PM [facility] [phone] or if no answer before 7:00PM call [phone] For any issues after 7:00PM please call eLink [phone]

## 2022-08-19 NOTE — Patient Instructions (Signed)
Reviewed your CT scan of the chest today We will plan to repeat your CT chest in September 2024 to compare with priors. Please continue your Trelegy as you have been taking it Keep your albuterol available to use 2 puffs or 1 nebulizer treatment when you needed for shortness of breath Follow Dr. Lamonte Sakai in September after your CT chest so we can review the results together.

## 2022-08-19 NOTE — Assessment & Plan Note (Signed)
Bilateral macro and micronodular disease, unchanged to improved on his most recent CT chest 08/04/2022.  He had a reassuring bronchoscopy 04/2022.  We will plan to repeat his CT in 6 months, September 2024.  If there is an interval change then we would consider repeat bronchoscopy or other means of biopsy given the difficult location of his predominant nodules

## 2022-08-19 NOTE — Assessment & Plan Note (Signed)
Unable to tolerate CPAP due to night terrors.  He is interested in possible inspire device but knows that he will need to get to target weight in order to be a candidate.

## 2022-08-27 ENCOUNTER — Other Ambulatory Visit: Payer: Self-pay | Admitting: Nurse Practitioner

## 2022-08-27 DIAGNOSIS — S161XXD Strain of muscle, fascia and tendon at neck level, subsequent encounter: Secondary | ICD-10-CM

## 2022-09-09 ENCOUNTER — Ambulatory Visit: Payer: BC Managed Care – PPO | Admitting: Internal Medicine

## 2022-09-10 ENCOUNTER — Ambulatory Visit: Payer: BC Managed Care – PPO | Admitting: Neurology

## 2022-09-10 ENCOUNTER — Encounter: Payer: Self-pay | Admitting: Neurology

## 2022-09-10 ENCOUNTER — Telehealth: Payer: Self-pay

## 2022-09-10 VITALS — BP 141/83 | HR 67 | Ht 72.0 in | Wt 336.0 lb

## 2022-09-10 DIAGNOSIS — G43709 Chronic migraine without aura, not intractable, without status migrainosus: Secondary | ICD-10-CM

## 2022-09-10 NOTE — Telephone Encounter (Signed)
Start botox authorization per Dr. Lucia Gaskins once office visit is signed   This is transfer of care from previous neurologist

## 2022-09-10 NOTE — Patient Instructions (Addendum)
Get botox transferred over Consider cervical dystonia in the future and possible extra botox in the neck (Dr. Jodean Lima possible) We will call for next botox appointment Follow up with Dr. Lajoyce Corners and Dr. Magnus Ivan and orthopaedics for possible CTS and neck   Sleep Apnea Sleep apnea is a condition in which breathing pauses or becomes shallow during sleep. People with sleep apnea usually snore loudly. They may have times when they gasp and stop breathing for 10 seconds or more during sleep. This may happen many times during the night. Sleep apnea disrupts your sleep and keeps your body from getting the rest that it needs. This condition can increase your risk of certain health problems, including: Heart attack. Stroke. Obesity. Type 2 diabetes. Heart failure. Irregular heartbeat. High blood pressure. The goal of treatment is to help you breathe normally again. What are the causes?  The most common cause of sleep apnea is a collapsed or blocked airway. There are three kinds of sleep apnea: Obstructive sleep apnea. This kind is caused by a blocked or collapsed airway. Central sleep apnea. This kind happens when the part of the brain that controls breathing does not send the correct signals to the muscles that control breathing. Mixed sleep apnea. This is a combination of obstructive and central sleep apnea. What increases the risk? You are more likely to develop this condition if you: Are overweight. Smoke. Have a smaller than normal airway. Are older. Are male. Drink alcohol. Take sedatives or tranquilizers. Have a family history of sleep apnea. Have a tongue or tonsils that are larger than normal. What are the signs or symptoms? Symptoms of this condition include: Trouble staying asleep. Loud snoring. Morning headaches. Waking up gasping. Dry mouth or sore throat in the morning. Daytime sleepiness and tiredness. If you have daytime fatigue because of sleep apnea, you may be more  likely to have: Trouble concentrating. Forgetfulness. Irritability or mood swings. Personality changes. Feelings of depression. Sexual dysfunction. This may include loss of interest if you are male, or erectile dysfunction if you are male. How is this diagnosed? This condition may be diagnosed with: A medical history. A physical exam. A series of tests that are done while you are sleeping (sleep study). These tests are usually done in a sleep lab, but they may also be done at home. How is this treated? Treatment for this condition aims to restore normal breathing and to ease symptoms during sleep. It may involve managing health issues that can affect breathing, such as high blood pressure or obesity. Treatment may include: Sleeping on your side. Using a decongestant if you have nasal congestion. Avoiding the use of depressants, including alcohol, sedatives, and narcotics. Losing weight if you are overweight. Making changes to your diet. Quitting smoking. Using a device to open your airway while you sleep, such as: An oral appliance. This is a custom-made mouthpiece that shifts your lower jaw forward. A continuous positive airway pressure (CPAP) device. This device blows air through a mask when you breathe out (exhale). A nasal expiratory positive airway pressure (EPAP) device. This device has valves that you put into each nostril. A bi-level positive airway pressure (BIPAP) device. This device blows air through a mask when you breathe in (inhale) and breathe out (exhale). Having surgery if other treatments do not work. During surgery, excess tissue is removed to create a wider airway. Follow these instructions at home: Lifestyle Make any lifestyle changes that your health care provider recommends. Eat a healthy, well-balanced diet. Take  steps to lose weight if you are overweight. Avoid using depressants, including alcohol, sedatives, and narcotics. Do not use any products that  contain nicotine or tobacco. These products include cigarettes, chewing tobacco, and vaping devices, such as e-cigarettes. If you need help quitting, ask your health care provider. General instructions Take over-the-counter and prescription medicines only as told by your health care provider. If you were given a device to open your airway while you sleep, use it only as told by your health care provider. If you are having surgery, make sure to tell your health care provider you have sleep apnea. You may need to bring your device with you. Keep all follow-up visits. This is important. Contact a health care provider if: The device that you received to open your airway during sleep is uncomfortable or does not seem to be working. Your symptoms do not improve. Your symptoms get worse. Get help right away if: You develop: Chest pain. Shortness of breath. Discomfort in your back, arms, or stomach. You have: Trouble speaking. Weakness on one side of your body. Drooping in your face. These symptoms may represent a serious problem that is an emergency. Do not wait to see if the symptoms will go away. Get medical help right away. Call your local emergency services (911 in the [place] Do not drive yourself to the hospital. Summary Sleep apnea is a condition in which breathing pauses or becomes shallow during sleep. The most common cause is a collapsed or blocked airway. The goal of treatment is to restore normal breathing and to ease symptoms during sleep. This information is not intended to replace advice given to you by your health care provider. Make sure you discuss any questions you have with your health care provider. Document Revised: 12/19/2020 Document Reviewed: 04/20/2020 Elsevier Patient Education  2023 [employer] Migraine Headache A migraine headache is an intense pulsing or throbbing pain on one or both sides of the head. Migraine headaches may also cause other symptoms, such as  nausea, vomiting, and sensitivity to light and noise. A migraine headache can last from 4 hours to 3 days. Talk with your health care provider about what things may bring on (trigger) your migraine headaches. What are the causes? The exact cause is not known. However, a migraine may be caused when nerves in the brain get irritated and release chemicals that cause blood vessels to become inflamed. This inflammation causes pain. Migraines may be triggered or caused by: Smoking. Medicines, such as: Nitroglycerin, which is used to treat chest pain. Birth control pills. Estrogen. Certain blood pressure medicines. Foods or drinks that contain nitrates, glutamate, aspartame, MSG, or tyramine. Certain foods or drinks, such as aged cheeses, chocolate, alcohol, or caffeine. Doing physical activity that is very hard. Other triggers may include: Menstruation. Pregnancy. Hunger. Stress. Getting too much or too little sleep. Weather changes. Tiredness (fatigue). What increases the risk? The following factors may make you more likely to have migraine headaches: Being between the ages of 65-40 years old. Being male. Having a family history of migraine headaches. Being Caucasian. Having a mental health condition, such as depression or anxiety. Being obese. What are the signs or symptoms? The main symptom of this condition is pulsing or throbbing pain. This pain may: Happen in any area of the head, such as on one or both sides. Make it hard to do daily activities. Get worse with physical activity. Get worse around bright lights, loud noises, or smells. Other symptoms may include: Nausea. Vomiting.  Dizziness. Before a migraine headache starts, you may get warning signs (an aura). An aura may include: Seeing flashing lights or having blind spots. Seeing bright spots, halos, or zigzag lines. Having tunnel vision or blurred vision. Having numbness or a tingling feeling. Having trouble  talking. Having muscle weakness. After a migraine ends, you may have symptoms. These may include: Feeling tired. Trouble concentrating. How is this diagnosed? A migraine headache can be diagnosed based on: Your symptoms. A physical exam. Tests, such as: A CT scan or an MRI of the head. These tests can help rule out other causes of headaches. Taking fluid from the spine (lumbar puncture) to examine it (cerebrospinal fluid analysis, or CSF analysis). How is this treated? This condition may be treated with medicines that: Relieve pain and nausea. Prevent migraines. Treatment may also include: Acupuncture. Lifestyle changes like avoiding foods that trigger migraine headaches. Learning ways to control your body (biofeedback). Talk therapy to help you know and deal with negative thoughts (cognitive behavioral therapy). Follow these instructions at home: Medicines Take over-the-counter and prescription medicines only as told by your provider. Ask your provider if the medicine prescribed to you: Requires you to avoid driving or using machinery. Can cause constipation. You may need to take these actions to prevent or treat constipation: Drink enough fluid to keep your pee (urine) pale yellow. Take over-the-counter or prescription medicines. Eat foods that are high in fiber, such as beans, whole grains, and fresh fruits and vegetables. Limit foods that are high in fat and processed sugars, such as fried or sweet foods. Lifestyle  Do not drink alcohol. Do not use any products that contain nicotine or tobacco. These products include cigarettes, chewing tobacco, and vaping devices, such as e-cigarettes. If you need help quitting, ask your provider. Get 7-9 hours of sleep each night, or the amount recommended by your provider. Find ways to manage stress, such as meditation, deep breathing, or yoga. Try to exercise regularly. This can help lessen how bad and how often your migraines  occur. General instructions Keep a journal to find out what triggers your migraines, so you can avoid those things. For example, write down: What you eat and drink. How much sleep you get. Any change to your diet or medicines. If you have a migraine headache: Avoid things that make your symptoms worse, such as bright lights. Lie down in a dark, quiet room. Do not drive or use machinery. Ask your provider what activities are safe for you while you have symptoms. Keep all follow-up visits. Your provider will monitor your symptoms and recommend any further treatment. Where to find more information [employer] for Headache and Migraine Patients ([facility]AMP): headachemigraine.org American Migraine Foundation: americanmigrainefoundation.org National Headache Foundation: headaches.org Contact a health care provider if: You have symptoms that are different or worse than your usual migraine headache symptoms. You have more than 15 days of headaches in one month. Get help right away if: Your migraine headache becomes severe or lasts more than 72 hours. You have a fever or stiff neck. You have vision loss. Your muscles feel weak or like you cannot control them. You lose your balance often or have trouble walking. You faint. You have a seizure. This information is not intended to replace advice given to you by your health care provider. Make sure you discuss any questions you have with your health care provider. Document Revised: 01/06/2022 Document Reviewed: 01/06/2022 Elsevier Patient Education  2023 [employer].
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
## 2022-09-13 ENCOUNTER — Encounter: Payer: Self-pay | Admitting: Neurology

## 2022-09-13 ENCOUNTER — Telehealth: Payer: Self-pay | Admitting: Neurology

## 2022-09-13 NOTE — Telephone Encounter (Signed)
Please start the botox transfer he can go to [place]. He gets his next one 09/22/2022 so plan for the next one thanks

## 2022-09-15 ENCOUNTER — Other Ambulatory Visit (HOSPITAL_COMMUNITY): Payer: Self-pay

## 2022-09-15 NOTE — Telephone Encounter (Signed)
Scheduled next Botox with Megan for 12/22/22 at 3:00 pm.

## 2022-09-15 NOTE — Telephone Encounter (Signed)
Please start BOTOX PA.  Chronic Migraine CPT 64615  Botox J0585 Units:200  G43.709 Chronic Migraine without aura, not intractable, without status migrainous  Insurance cards in [employer]

## 2022-09-15 NOTE — Telephone Encounter (Signed)
   Benefit Verification [id number] Submitted!

## 2022-09-16 ENCOUNTER — Other Ambulatory Visit: Payer: Self-pay

## 2022-09-16 ENCOUNTER — Other Ambulatory Visit (HOSPITAL_COMMUNITY): Payer: Self-pay

## 2022-09-16 MED ORDER — ONABOTULINUMTOXINA 200 UNITS IJ SOLR
INTRAMUSCULAR | 1 refills | Status: DC
Start: 2022-09-16 — End: 2023-12-08
  Filled 2022-09-16: qty 1, fill #0
  Filled 2022-11-24: qty 1, 30d supply, fill #0

## 2022-09-16 NOTE — Telephone Encounter (Signed)
Chronic Migraine CPT 64615  Botox J0585 Units:200  G43.709 Chronic Migraine without aura, not intractable, without status migrainous 

## 2022-09-16 NOTE — Addendum Note (Signed)
Addended by: Lenn Cal on: 09/16/2022 10:16 AM   Modules accepted: Orders

## 2022-09-18 ENCOUNTER — Telehealth: Payer: Self-pay | Admitting: Pharmacy Technician

## 2022-09-18 ENCOUNTER — Other Ambulatory Visit (HOSPITAL_COMMUNITY): Payer: Self-pay

## 2022-09-18 NOTE — Telephone Encounter (Signed)
Patient Advocate Encounter   Received notification that prior authorization for Botox 200UNIT solution is required.   PA submitted on 09/18/2022 [id number] [place] Commercial Electronic Request Form Status is pending

## 2022-09-18 NOTE — Telephone Encounter (Signed)
Status of PA in separate encounter 

## 2022-09-19 ENCOUNTER — Other Ambulatory Visit: Payer: Self-pay | Admitting: Nurse Practitioner

## 2022-09-19 DIAGNOSIS — S161XXD Strain of muscle, fascia and tendon at neck level, subsequent encounter: Secondary | ICD-10-CM

## 2022-09-23 ENCOUNTER — Other Ambulatory Visit (HOSPITAL_COMMUNITY): Payer: Self-pay

## 2022-09-23 NOTE — Telephone Encounter (Signed)
Once we get approval, we need to expedite scheduling patient. Received fax from [facility] neurology stating unfortunately they were unable to inject the patient on 09/22/22 due to an auth already being started by our office. Patient is due now.

## 2022-09-29 NOTE — Progress Notes (Unsigned)
Assessment and Plan:  There are no diagnoses linked to this encounter.    Further disposition pending results of labs. Discussed med's effects and SE's.   Over 30 minutes of exam, counseling, chart review, and critical decision making was performed.   Future Appointments  Date Time Provider Department Center  09/30/2022 11:30 AM Raynelle Dick, NP [name]  10/09/2022  3:45 PM [place], Jake Seats T, DPM [name]  10/14/2022  3:30 PM Lucky Cowboy, MD [name]  12/22/2022  3:00 PM Butch Penny, NP [facility] None  01/15/2023  3:15 PM Alfonse Spruce, MD [name]  02/24/2023  3:00 PM Lucky Cowboy, MD [name]    ------------------------------------------------------------------------------------------------------------------   HPI There were no vitals taken for this visit. 61 y.o.male presents for  Past Medical History:  Diagnosis Date   Anesthesia complication requiring reversal agent administration    ? from central apnea, very difficult to get off vent   Anxiety    Arthritis    osteo   Asthma    BPH (benign prostatic hyperplasia)    Complication of anesthesia    difficulty waking , they twlight me because of my respiratory problems "   COPD (chronic obstructive pulmonary disease) (HCC)    COVID    moderate case 2023   Depression    DVT (deep venous thrombosis) (HCC) 2023   right leg   Dyspnea    on exertion   Enlarged heart    Family history of adverse reaction to anesthesia    mother trouble waking up, and heart stopped   GERD (gastroesophageal reflux disease)    Headache    botox injections for headaches   Hyperlipidemia    Hypertension    Hypogonadism male    IBS (irritable bowel syndrome)    Memory difficulties    short term memories   Neuropathy    Obesity    On home oxygen therapy    on 2 liter   OSA (obstructive sleep apnea)    not using cpap   Paralysis (HCC)    left hand - small    due to accident with an  head injury   Pneumonia    Pre-diabetes    Prostatitis      Allergies  Allergen Reactions   Bee Venom Swelling   Cymbalta [Duloxetine Hcl] Shortness Of Breath    Brought on asthma   Ppd [Tuberculin Purified Protein Derivative] Other (See Comments)    +ppd NEG Quantferron Gold 3/13 (shows false positive)    Calan [Verapamil] Other (See Comments)    Back pain   Tricor [Fenofibrate] Other (See Comments)    Back pain   Claritin [Loratadine] Other (See Comments)    Unknown reaction    Levaquin [Levofloxacin] Diarrhea    Current Outpatient Medications on File Prior to Visit  Medication Sig   acetaminophen (TYLENOL) 500 MG tablet Take 1,000 mg by mouth 3 (three) times daily.   anastrozole (ARIMIDEX) 1 MG tablet Take 1 mg by mouth daily.   arformoterol (BROVANA) 15 MCG/2ML NEBU Take 2 mLs (15 mcg total) by nebulization in the morning and at bedtime.   ascorbic acid (VITAMIN C) 1000 MG tablet Take 1,000 mg by mouth every evening.   aspirin EC 81 MG tablet Take 81 mg by mouth daily. Swallow whole.   Azelastine HCl 137 MCG/SPRAY SOLN USE 1 TO 2 SPRAYS EACH NOSTRIL 2 X /DAY   benzonatate (TESSALON) 200 MG capsule Take 1 capsule 3 x /day to Prevent  Cough                                                     /                                  TAKE                               BY                              MOUTH   botulinum toxin Type A (BOTOX) 200 units injection Provider to inject 155 units into the muscles of the head and neck every 12 weeks. Discard remainder.   Botulinum Toxin Type A 200 units SOLR Inject 200 Units into the skin every 3 (three) months.   budesonide (PULMICORT) 0.5 MG/2ML nebulizer solution Take 2 mLs (0.5 mg total) by nebulization 2 (two) times daily.   Calcium Carb-Cholecalciferol (CALCIUM + VITAMIN D3 PO) Take 1 tablet by mouth daily.   celecoxib (CELEBREX) 200 MG capsule TAKE 1 CAPSULE (200 MG TOTAL) BY MOUTH EVERY 12 (TWELVE) HOURS.   cetaphil (CETAPHIL) lotion Apply 1  Application topically 2 (two) times daily.   cetirizine (ZYRTEC) 10 MG tablet Take 1 tablet (10 mg total) by mouth in the morning.   Cholecalciferol (VITAMIN D3) 125 MCG (5000 UT) TABS Take 5,000-10,000 Units by mouth See admin instructions. Take 10,000 in the morning and 5,000 units in the evening   Cinnamon 500 MG capsule Take 1,000 mg by mouth 3 (three) times daily.   Coenzyme Q10 (CO Q 10 PO) Take 600 mg by mouth daily.   cyclobenzaprine (FLEXERIL) 10 MG tablet TAKE 1/2 TO 1 TABLET 3 TIMES A DAY ONLY IF NEED FOR MUSCLE SPASM (NECK)   diclofenac Sodium (VOLTAREN) 1 % GEL Apply 2-4 g topically 2 (two) times daily as needed (pain).   donepezil (ARICEPT) 23 MG TABS tablet Take 1 tablet Daily for Memory   EPINEPHrine 0.3 mg/0.3 mL IJ SOAJ injection Inject 0.3 mg into the muscle as needed for anaphylaxis.   fluticasone (FLONASE) 50 MCG/ACT nasal spray Place 2 sprays into both nostrils 2 (two) times daily.   furosemide (LASIX) 80 MG tablet Take 80 mg by mouth daily as needed for edema.   Guaifenesin 1200 MG TB12 Take 1,200 mg by mouth 3 (three) times daily.   ipratropium-albuterol (DUONEB) 0.5-2.5 (3) MG/3ML SOLN Take 3 mLs by nebulization 2 (two) times daily.   KLOR-CON M20 20 MEQ tablet TAKE 1 TABLET TWICE DAILY FOR POTASSIUM   Krill Oil 500 MG CAPS Take 500 mg by mouth 2 (two) times daily.   Magnesium 500 MG CAPS Take 500 mg by mouth 3 (three) times daily.   memantine (NAMENDA) 10 MG tablet Take 10 mg by mouth 2 (two) times daily.   metFORMIN (GLUCOPHAGE-XR) 500 MG 24 hr tablet TAKES 2 TABLETS 2 X /DAY WITH MEALS FOR DIABETES   metoprolol tartrate (LOPRESSOR) 25 MG tablet Take  1 tablet  2 x /day (every 12 hours)  for BP                                           /  TAKE                 BY                  MOUTH   Misc Natural Products (GLUCOS-CHONDROIT-MSM COMPLEX) TABS Take 2 tablets by mouth 3 (three) times daily.   montelukast (SINGULAIR) 10 MG tablet TAKE 1 TABLET  BY MOUTH DAILY FOR ALLERGIES   Multiple Vitamins-Minerals (MULTIVITAMIN WITH MINERALS) tablet Take 1 tablet by mouth every morning.   Multiple Vitamins-Minerals (PRESERVISION AREDS 2) CAPS Take 1 capsule by mouth 2 (two) times daily. Evening and night   oxymetazoline (AFRIN) 0.05 % nasal spray Place 1 spray into both nostrils at bedtime.   pentosan polysulfate (ELMIRON) 100 MG capsule Take 100 mg by mouth 3 (three) times daily.   pravastatin (PRAVACHOL) 40 MG tablet TAKE 1 TABLET BY MOUTH EVERY DAY AT BEDTIME FOR CHOLESTEROL   pregabalin (LYRICA) 150 MG capsule TAKE 1 CAPSULE 3 TIMES A DAY FOR CHRONIC PAIN   PROBIOTIC PRODUCT PO Take 1 capsule by mouth 2 (two) times daily. VSL   promethazine-dextromethorphan (PROMETHAZINE-DM) 6.25-15 MG/5ML syrup Take 5 mLs by mouth 4 (four) times daily as needed for cough.   Semaglutide, 2 MG/DOSE, (OZEMPIC, 2 MG/DOSE,) 8 MG/3ML SOPN Inject  2 mg  into Skin every 7 days for Diabetes  (E11.29)   sertraline (ZOLOFT) 100 MG tablet Take 1 tablet (100 mg total) by mouth 2 (two) times daily. for Mood / Depression   sodium chloride (OCEAN) 0.65 % SOLN nasal spray Place 1 spray into both nostrils 4 (four) times daily as needed for congestion. Uses each time before other nasal sprays   tadalafil (CIALIS) 5 MG tablet Take 5 mg by mouth daily.    TRELEGY ELLIPTA 100-62.5-25 MCG/ACT AEPB Inhale 1 puff into the lungs daily.   trospium (SANCTURA) 20 MG tablet Take 20 mg by mouth 2 (two) times daily. In the evening and at bedtime   TURMERIC PO Take 2,000 mg by mouth 3 (three) times daily. 1000 mg each   VENTOLIN HFA 108 (90 Base) MCG/ACT inhaler INHALE 2 PUFFS BY MOUTH EVERY 4 HOURS AS NEEDED FOR WHEEZE OR FOR SHORTNESS OF BREATH   zinc gluconate 50 MG tablet Take 50 mg by mouth 3 (three) times daily.   No current facility-administered medications on file prior to visit.    ROS: all negative except above.   Physical Exam:  There were no vitals taken for this  visit.  General Appearance: Well nourished, in no apparent distress. Eyes: PERRLA, EOMs, conjunctiva no swelling or erythema Sinuses: No Frontal/maxillary tenderness ENT/Mouth: Ext aud canals clear, TMs without erythema, bulging. No erythema, swelling, or exudate on post pharynx.  Tonsils not swollen or erythematous. Hearing normal.  Neck: Supple, thyroid normal.  Respiratory: Respiratory effort normal, BS equal bilaterally without rales, rhonchi, wheezing or stridor.  Cardio: RRR with no MRGs. Brisk peripheral pulses without edema.  Abdomen: Soft, + BS.  Non tender, no guarding, rebound, hernias, masses. Lymphatics: Non tender without lymphadenopathy.  Musculoskeletal: Full ROM, 5/5 strength, normal gait.  Skin: Warm, dry without rashes, lesions, ecchymosis.  Neuro: Cranial nerves intact. Normal muscle tone, no cerebellar symptoms. Sensation intact.  Psych: Awake and oriented X 3, normal affect, Insight and Judgment appropriate.     Raynelle Dick, NP 3:03 PM [facility]

## 2022-09-30 ENCOUNTER — Ambulatory Visit (INDEPENDENT_AMBULATORY_CARE_PROVIDER_SITE_OTHER): Payer: BC Managed Care – PPO | Admitting: Nurse Practitioner

## 2022-09-30 ENCOUNTER — Encounter: Payer: Self-pay | Admitting: Nurse Practitioner

## 2022-09-30 VITALS — BP 156/90 | HR 78 | Temp 97.3°F | Ht 72.0 in | Wt 337.2 lb

## 2022-09-30 DIAGNOSIS — I10 Essential (primary) hypertension: Secondary | ICD-10-CM

## 2022-09-30 DIAGNOSIS — Z1152 Encounter for screening for COVID-19: Secondary | ICD-10-CM | POA: Diagnosis not present

## 2022-09-30 DIAGNOSIS — R6889 Other general symptoms and signs: Secondary | ICD-10-CM | POA: Diagnosis not present

## 2022-09-30 DIAGNOSIS — H6691 Otitis media, unspecified, right ear: Secondary | ICD-10-CM | POA: Diagnosis not present

## 2022-09-30 DIAGNOSIS — S161XXD Strain of muscle, fascia and tendon at neck level, subsequent encounter: Secondary | ICD-10-CM

## 2022-09-30 LAB — POCT INFLUENZA A/B
Influenza A, POC: NEGATIVE
Influenza B, POC: NEGATIVE

## 2022-09-30 LAB — POC COVID19 BINAXNOW: SARS Coronavirus 2 Ag: NEGATIVE

## 2022-09-30 MED ORDER — CYCLOBENZAPRINE HCL 10 MG PO TABS
ORAL_TABLET | ORAL | 0 refills | Status: DC
Start: 2022-09-30 — End: 2022-11-07

## 2022-09-30 MED ORDER — LOSARTAN POTASSIUM 25 MG PO TABS
25.0000 mg | ORAL_TABLET | Freq: Every day | ORAL | 3 refills | Status: DC
Start: 2022-09-30 — End: 2023-03-17

## 2022-09-30 MED ORDER — IPRATROPIUM BROMIDE 0.03 % NA SOLN
2.0000 | Freq: Three times a day (TID) | NASAL | 2 refills | Status: DC
Start: 2022-09-30 — End: 2023-01-15

## 2022-09-30 MED ORDER — AMOXICILLIN-POT CLAVULANATE 875-125 MG PO TABS
1.0000 | ORAL_TABLET | Freq: Two times a day (BID) | ORAL | 0 refills | Status: DC
Start: 2022-09-30 — End: 2023-07-14

## 2022-09-30 MED ORDER — PREDNISONE 20 MG PO TABS
ORAL_TABLET | ORAL | 0 refills | Status: AC
Start: 2022-09-30 — End: 2022-10-11

## 2022-09-30 NOTE — Patient Instructions (Signed)
Start Losartan 1 tab once in the morning and continue metoprolol 25 mg twice a day.  Continue to monitor BP if not consistently less than 130/80 notify the office  Use atrovent nasal spray 2 sprays each nostril up to three times a day  Augmentin twice a day for 7 days  Prednisone 20 mg 3 tabs x 3 days, 2 tabs x 3 days and 1 tab x 5 days.   Hypertension, Adult High blood pressure (hypertension) is when the force of blood pumping through the arteries is too strong. The arteries are the blood vessels that carry blood from the heart throughout the body. Hypertension forces the heart to work harder to pump blood and may cause arteries to become narrow or stiff. Untreated or uncontrolled hypertension can lead to a heart attack, heart failure, a stroke, kidney disease, and other problems. A blood pressure reading consists of a higher number over a lower number. Ideally, your blood pressure should be below 120/80. The first ("top") number is called the systolic pressure. It is a measure of the pressure in your arteries as your heart beats. The second ("bottom") number is called the diastolic pressure. It is a measure of the pressure in your arteries as the heart relaxes. What are the causes? The exact cause of this condition is not known. There are some conditions that result in high blood pressure. What increases the risk? Certain factors may make you more likely to develop high blood pressure. Some of these risk factors are under your control, including: Smoking. Not getting enough exercise or physical activity. Being overweight. Having too much fat, sugar, calories, or salt (sodium) in your diet. Drinking too much alcohol. Other risk factors include: Having a personal history of heart disease, diabetes, high cholesterol, or kidney disease. Stress. Having a family history of high blood pressure and high cholesterol. Having obstructive sleep apnea. Age. The risk increases with age. What are the  signs or symptoms? High blood pressure may not cause symptoms. Very high blood pressure (hypertensive crisis) may cause: Headache. Fast or irregular heartbeats (palpitations). Shortness of breath. Nosebleed. Nausea and vomiting. Vision changes. Severe chest pain, dizziness, and seizures. How is this diagnosed? This condition is diagnosed by measuring your blood pressure while you are seated, with your arm resting on a flat surface, your legs uncrossed, and your feet flat on the floor. The cuff of the blood pressure monitor will be placed directly against the skin of your upper arm at the level of your heart. Blood pressure should be measured at least twice using the same arm. Certain conditions can cause a difference in blood pressure between your right and left arms. If you have a high blood pressure reading during one visit or you have normal blood pressure with other risk factors, you may be asked to: Return on a different day to have your blood pressure checked again. Monitor your blood pressure at home for 1 week or longer. If you are diagnosed with hypertension, you may have other blood or imaging tests to help your health care provider understand your overall risk for other conditions. How is this treated? This condition is treated by making healthy lifestyle changes, such as eating healthy foods, exercising more, and reducing your alcohol intake. You may be referred for counseling on a healthy diet and physical activity. Your health care provider may prescribe medicine if lifestyle changes are not enough to get your blood pressure under control and if: Your systolic blood pressure is above 130.  Your diastolic blood pressure is above 80. Your personal target blood pressure may vary depending on your medical conditions, your age, and other factors. Follow these instructions at home: Eating and drinking  Eat a diet that is high in fiber and potassium, and low in sodium, added sugar, and  fat. An example of this eating plan is called the DASH diet. DASH stands for Dietary Approaches to Stop Hypertension. To eat this way: Eat plenty of fresh fruits and vegetables. Try to fill one half of your plate at each meal with fruits and vegetables. Eat whole grains, such as whole-wheat pasta, brown rice, or whole-grain bread. Fill about one fourth of your plate with whole grains. Eat or drink low-fat dairy products, such as skim milk or low-fat yogurt. Avoid fatty cuts of meat, processed or cured meats, and poultry with skin. Fill about one fourth of your plate with lean proteins, such as fish, chicken without skin, beans, eggs, or tofu. Avoid pre-made and processed foods. These tend to be higher in sodium, added sugar, and fat. Reduce your daily sodium intake. Many people with hypertension should eat less than 1,500 mg of sodium a day. Do not drink alcohol if: Your health care provider tells you not to drink. You are pregnant, may be pregnant, or are planning to become pregnant. If you drink alcohol: Limit how much you have to: 0-1 drink a day for women. 0-2 drinks a day for men. Know how much alcohol is in your drink. In the [place] one drink equals one 12 oz bottle of beer (355 mL), one 5 oz glass of wine (148 mL), or one 1 oz glass of hard liquor (44 mL). Lifestyle  Work with your health care provider to maintain a healthy body weight or to lose weight. Ask what an ideal weight is for you. Get at least 30 minutes of exercise that causes your heart to beat faster (aerobic exercise) most days of the week. Activities may include walking, swimming, or biking. Include exercise to strengthen your muscles (resistance exercise), such as Pilates or lifting weights, as part of your weekly exercise routine. Try to do these types of exercises for 30 minutes at least 3 days a week. Do not use any products that contain nicotine or tobacco. These products include cigarettes, chewing tobacco, and vaping  devices, such as e-cigarettes. If you need help quitting, ask your health care provider. Monitor your blood pressure at home as told by your health care provider. Keep all follow-up visits. This is important. Medicines Take over-the-counter and prescription medicines only as told by your health care provider. Follow directions carefully. Blood pressure medicines must be taken as prescribed. Do not skip doses of blood pressure medicine. Doing this puts you at risk for problems and can make the medicine less effective. Ask your health care provider about side effects or reactions to medicines that you should watch for. Contact a health care provider if you: Think you are having a reaction to a medicine you are taking. Have headaches that keep coming back (recurring). Feel dizzy. Have swelling in your ankles. Have trouble with your vision. Get help right away if you: Develop a severe headache or confusion. Have unusual weakness or numbness. Feel faint. Have severe pain in your chest or abdomen. Vomit repeatedly. Have trouble breathing. These symptoms may be an emergency. Get help right away. Call 911. Do not wait to see if the symptoms will go away. Do not drive yourself to the hospital. Summary Hypertension is  when the force of blood pumping through your arteries is too strong. If this condition is not controlled, it may put you at risk for serious complications. Your personal target blood pressure may vary depending on your medical conditions, your age, and other factors. For most people, a normal blood pressure is less than 120/80. Hypertension is treated with lifestyle changes, medicines, or a combination of both. Lifestyle changes include losing weight, eating a healthy, low-sodium diet, exercising more, and limiting alcohol. This information is not intended to replace advice given to you by your health care provider. Make sure you discuss any questions you have with your health care  provider. Document Revised: 03/19/2021 Document Reviewed: 03/19/2021 Elsevier Patient Education  2023 [employer].

## 2022-09-30 NOTE — Telephone Encounter (Signed)
PA started in other encounter

## 2022-10-01 ENCOUNTER — Other Ambulatory Visit (HOSPITAL_COMMUNITY): Payer: Self-pay

## 2022-10-01 NOTE — Telephone Encounter (Signed)
Could not get [facility] to recognize PT-faxed [employer] Botox PA form along with clinicals to [phone].

## 2022-10-02 ENCOUNTER — Other Ambulatory Visit (HOSPITAL_COMMUNITY): Payer: Self-pay

## 2022-10-02 NOTE — Telephone Encounter (Signed)
    Approved- S/P  [facility]

## 2022-10-09 ENCOUNTER — Encounter: Payer: Self-pay | Admitting: Podiatry

## 2022-10-09 ENCOUNTER — Ambulatory Visit: Payer: BC Managed Care – PPO | Admitting: Podiatry

## 2022-10-09 DIAGNOSIS — D2371 Other benign neoplasm of skin of right lower limb, including hip: Secondary | ICD-10-CM | POA: Diagnosis not present

## 2022-10-09 DIAGNOSIS — D2372 Other benign neoplasm of skin of left lower limb, including hip: Secondary | ICD-10-CM

## 2022-10-09 DIAGNOSIS — M79676 Pain in unspecified toe(s): Secondary | ICD-10-CM | POA: Diagnosis not present

## 2022-10-09 DIAGNOSIS — B351 Tinea unguium: Secondary | ICD-10-CM | POA: Diagnosis not present

## 2022-10-09 DIAGNOSIS — M7751 Other enthesopathy of right foot: Secondary | ICD-10-CM | POA: Diagnosis not present

## 2022-10-09 DIAGNOSIS — M7752 Other enthesopathy of left foot: Secondary | ICD-10-CM

## 2022-10-09 DIAGNOSIS — M722 Plantar fascial fibromatosis: Secondary | ICD-10-CM | POA: Diagnosis not present

## 2022-10-09 MED ORDER — DEXAMETHASONE SODIUM PHOSPHATE 120 MG/30ML IJ SOLN
4.0000 mg | Freq: Once | INTRAMUSCULAR | Status: AC
Start: 2022-10-09 — End: 2022-10-09
  Administered 2022-10-09: 4 mg via INTRA_ARTICULAR

## 2022-10-09 MED ORDER — TRIAMCINOLONE ACETONIDE 40 MG/ML IJ SUSP
40.0000 mg | Freq: Once | INTRAMUSCULAR | Status: AC
Start: 2022-10-09 — End: 2022-10-09
  Administered 2022-10-09: 40 mg

## 2022-10-10 NOTE — Progress Notes (Signed)
He presents today chief complaint of painful fifth metatarsal heads bilateral.  As he points to a painful callus.  He is also complaining of painful elongated toenails.  Objective: Vital signs are stable he is alert and oriented x 3.  Pulses are palpable.  He has a palpable bursa beneath the fifth metatarsal head bilaterally.  He also has elongated toenails.  Beneath the fifth metatarsal head there is a benign skin lesion that is painful.  Fluctuance is noted on palpation.  Assessment: Fluctuance is consistent with bursitis subfifth metatarsal heads bilaterally.  Benign skin lesion.  Toenails are thick yellow dystrophic, clinically mycotic.  Plan: I debrided nails 1 through 5 bilaterally debrided benign skin lesions injected the bursa beneath the fifth metatarsals bilateral.

## 2022-10-13 ENCOUNTER — Encounter: Payer: Self-pay | Admitting: Internal Medicine

## 2022-10-13 NOTE — Progress Notes (Unsigned)
Future Appointments  Date Time Provider Department  10/14/2022              6 mo ov   3:30 PM Lucky Cowboy, MD [facility]  12/22/2022  3:00 PM Butch Penny, NP [facility]  01/13/2023  3:45 PM Elinor Parkinson, DPM [facility]  01/15/2023  3:15 PM Alfonse Spruce, MD [facility]  02/24/2023                 cpe   3:00 PM Lucky Cowboy, MD [facility]    History of Present Illness:       This very nice 62 y.o.male presents for 3 month follow up with HTN, HLD, Pre-Diabetes and Vitamin D Deficiency.         Patient is treated for HTN  since  & BP has been controlled at home. Today's  . Patient has had no complaints of any cardiac type chest pain, palpitations, dyspnea / orthopnea / PND, dizziness, claudication, or dependent edema.        Hyperlipidemia is controlled with diet & meds. Patient denies myalgias or other med SE's. Last Lipids were  Lab Results  Component Value Date   CHOL 166 06/05/2022   HDL 36 (L) 06/05/2022   LDLCALC 97 06/05/2022   TRIG 221 (H) 06/05/2022   CHOLHDL 4.6 06/05/2022      Also, the patient has history of T2_NIDDM PreDiabetes and has had no symptoms of reactive hypoglycemia, diabetic polys, paresthesias or visual blurring.  Last A1c was   Lab Results  Component Value Date   HGBA1C 5.4 06/05/2022         Further, the patient also has history of Vitamin D Deficiency and supplements vitamin D . Last vitamin D was  Lab Results  Component Value Date   VD25OH 100 06/05/2022      Current Outpatient Medications on File Prior to Visit  Medication Sig   acetaminophen (TYLENOL) 500 MG tablet Take 1,000 mg by mouth 3 (three) times daily.   amoxicillin-clavulanate (AUGMENTIN) 875-125 MG tablet Take 1 tablet by mouth 2 (two) times daily.   anastrozole (ARIMIDEX) 1 MG tablet Take 1 mg by mouth daily.   arformoterol (BROVANA) 15 MCG/2ML NEBU Take 2 mLs (15 mcg total) by nebulization in the morning and at bedtime.   ascorbic acid (VITAMIN C) 1000 MG  tablet Take 1,000 mg by mouth every evening.   aspirin EC 81 MG tablet Take 81 mg by mouth daily. Swallow whole.   Azelastine HCl 137 MCG/SPRAY SOLN USE 1 TO 2 SPRAYS EACH NOSTRIL 2 X /DAY   benzonatate (TESSALON) 200 MG capsule Take 1 capsule 3 x /day to Prevent Cough                                                     /                                  TAKE                               BY  MOUTH   botulinum toxin Type A (BOTOX) 200 units injection Provider to inject 155 units into the muscles of the head and neck every 12 weeks. Discard remainder.   Botulinum Toxin Type A 200 units SOLR Inject 200 Units into the skin every 3 (three) months.   budesonide (PULMICORT) 0.5 MG/2ML nebulizer solution Take 2 mLs (0.5 mg total) by nebulization 2 (two) times daily.   Calcium Carb-Cholecalciferol (CALCIUM + VITAMIN D3 PO) Take 1 tablet by mouth daily.   celecoxib (CELEBREX) 200 MG capsule TAKE 1 CAPSULE (200 MG TOTAL) BY MOUTH EVERY 12 (TWELVE) HOURS.   cetaphil (CETAPHIL) lotion Apply 1 Application topically 2 (two) times daily.   cetirizine (ZYRTEC) 10 MG tablet Take 1 tablet (10 mg total) by mouth in the morning.   Cholecalciferol (VITAMIN D3) 125 MCG (5000 UT) TABS Take 5,000-10,000 Units by mouth See admin instructions. Take 10,000 in the morning and 5,000 units in the evening   Cinnamon 500 MG capsule Take 1,000 mg by mouth 3 (three) times daily.   Coenzyme Q10 (CO Q 10 PO) Take 600 mg by mouth daily.   cyclobenzaprine (FLEXERIL) 10 MG tablet TAKE 1/2 TO 1 TABLET 3 TIMES A DAY ONLY IF NEED FOR MUSCLE SPASM (NECK)   Dextromethorphan HBr (DELSYM PO) Take by mouth.   diclofenac Sodium (VOLTAREN) 1 % GEL Apply 2-4 g topically 2 (two) times daily as needed (pain).   DM-APAP-CPM (CORICIDIN HBP PO) Take by mouth.   donepezil (ARICEPT) 23 MG TABS tablet Take 1 tablet Daily for Memory   EPINEPHrine 0.3 mg/0.3 mL IJ SOAJ injection Inject 0.3 mg into the muscle as needed for  anaphylaxis.   fluticasone (FLONASE) 50 MCG/ACT nasal spray Place 2 sprays into both nostrils 2 (two) times daily.   furosemide (LASIX) 80 MG tablet Take 80 mg by mouth daily as needed for edema.   Guaifenesin 1200 MG TB12 Take 1,200 mg by mouth 3 (three) times daily.   ipratropium (ATROVENT) 0.03 % nasal spray Place 2 sprays into the nose 3 (three) times daily.   ipratropium-albuterol (DUONEB) 0.5-2.5 (3) MG/3ML SOLN Take 3 mLs by nebulization 2 (two) times daily.   KLOR-CON M20 20 MEQ tablet TAKE 1 TABLET TWICE DAILY FOR POTASSIUM   Krill Oil 500 MG CAPS Take 500 mg by mouth 2 (two) times daily.   losartan (COZAAR) 25 MG tablet Take 1 tablet (25 mg total) by mouth daily.   Magnesium 500 MG CAPS Take 500 mg by mouth 3 (three) times daily.   memantine (NAMENDA) 10 MG tablet Take 10 mg by mouth 2 (two) times daily.   metFORMIN (GLUCOPHAGE-XR) 500 MG 24 hr tablet TAKES 2 TABLETS 2 X /DAY WITH MEALS FOR DIABETES   metoprolol tartrate (LOPRESSOR) 25 MG tablet Take  1 tablet  2 x /day (every 12 hours)  for BP                                           /                               TAKE                 BY                  MOUTH   Misc  Natural Products (GLUCOS-CHONDROIT-MSM COMPLEX) TABS Take 2 tablets by mouth 3 (three) times daily.   montelukast (SINGULAIR) 10 MG tablet TAKE 1 TABLET BY MOUTH DAILY FOR ALLERGIES   Multiple Vitamins-Minerals (MULTIVITAMIN WITH MINERALS) tablet Take 1 tablet by mouth every morning.   Multiple Vitamins-Minerals (PRESERVISION AREDS 2) CAPS Take 1 capsule by mouth 2 (two) times daily. Evening and night   oxymetazoline (AFRIN) 0.05 % nasal spray Place 1 spray into both nostrils at bedtime.   pentosan polysulfate (ELMIRON) 100 MG capsule Take 100 mg by mouth 3 (three) times daily.   pravastatin (PRAVACHOL) 40 MG tablet TAKE 1 TABLET BY MOUTH EVERY DAY AT BEDTIME FOR CHOLESTEROL   pregabalin (LYRICA) 150 MG capsule TAKE 1 CAPSULE 3 TIMES A DAY FOR CHRONIC PAIN   PROBIOTIC  PRODUCT PO Take 1 capsule by mouth 2 (two) times daily. VSL   promethazine-dextromethorphan (PROMETHAZINE-DM) 6.25-15 MG/5ML syrup Take 5 mLs by mouth 4 (four) times daily as needed for cough.   Semaglutide, 2 MG/DOSE, (OZEMPIC, 2 MG/DOSE,) 8 MG/3ML SOPN Inject  2 mg  into Skin every 7 days for Diabetes  (E11.29)   sertraline (ZOLOFT) 100 MG tablet Take 1 tablet (100 mg total) by mouth 2 (two) times daily. for Mood / Depression   sodium chloride (OCEAN) 0.65 % SOLN nasal spray Place 1 spray into both nostrils 4 (four) times daily as needed for congestion. Uses each time before other nasal sprays   tadalafil (CIALIS) 5 MG tablet Take 5 mg by mouth daily.    TRELEGY ELLIPTA 100-62.5-25 MCG/ACT AEPB Inhale 1 puff into the lungs daily.   trospium (SANCTURA) 20 MG tablet Take 20 mg by mouth 2 (two) times daily. In the evening and at bedtime   TURMERIC PO Take 2,000 mg by mouth 3 (three) times daily. 1000 mg each   VENTOLIN HFA 108 (90 Base) MCG/ACT inhaler INHALE 2 PUFFS BY MOUTH EVERY 4 HOURS AS NEEDED FOR WHEEZE OR FOR SHORTNESS OF BREATH   zinc gluconate 50 MG tablet Take 50 mg by mouth 3 (three) times daily.   No current facility-administered medications on file prior to visit.      Allergies  Allergen Reactions   Bee Venom Swelling   Cymbalta [Duloxetine Hcl] Shortness Of Breath    Brought on asthma   Ppd [Tuberculin Purified Protein Derivative] Other (See Comments)    +ppd NEG Quantferron Gold 3/13 (shows false positive)    Calan [Verapamil] Other (See Comments)    Back pain   Tricor [Fenofibrate] Other (See Comments)    Back pain   Claritin [Loratadine] Other (See Comments)    Unknown reaction    Levaquin [Levofloxacin] Diarrhea      PMHx:   Past Medical History:  Diagnosis Date   Anesthesia complication requiring reversal agent administration    ? from central apnea, very difficult to get off vent   Anxiety    Arthritis    osteo   Asthma    BPH (benign prostatic  hyperplasia)    Complication of anesthesia    difficulty waking , they twlight me because of my respiratory problems "   COPD (chronic obstructive pulmonary disease) (HCC)    COVID    moderate case 2023   Depression    DVT (deep venous thrombosis) (HCC) 2023   right leg   Dyspnea    on exertion   Enlarged heart    Family history of adverse reaction to anesthesia    mother trouble waking up, and  heart stopped   GERD (gastroesophageal reflux disease)    Headache    botox injections for headaches   Hyperlipidemia    Hypertension    Hypogonadism male    IBS (irritable bowel syndrome)    Memory difficulties    short term memories   Neuropathy    Obesity    On home oxygen therapy    on 2 liter   OSA (obstructive sleep apnea)    not using cpap   Paralysis (HCC)    left hand - small    due to accident with an head injury   Pneumonia    Pre-diabetes    Prostatitis       Immunization History  Administered Date(s) Administered   Influenza,inj,Quad PF,6+ Mos 03/04/2016, 03/09/2017, 05/19/2018, 01/06/2019   Influenza-Unspecified 02/24/2015, 05/19/2018, 12/28/2018, 01/24/2019, 04/10/2021, 02/23/2022   PPD Test 08/06/2011   Pneumococcal Conjugate-13 11/02/2014   Pneumococcal Polysaccharide-23 04/10/2016   Pneumococcal-Unspecified 05/26/2004   Td 05/26/2000   Tdap 08/01/2013   Zoster Recombinat (Shingrix) 11/05/2016, 03/14/2017   Zoster, Live 05/26/2009      Past Surgical History:  Procedure Laterality Date   ABDOMINAL SURGERY     ANKLE FRACTURE SURGERY Right    ANTERIOR CERVICAL DECOMP/DISCECTOMY FUSION N/A 05/23/2019   Procedure: ANTERIOR CERVICAL DISCECTOMY FUSION CERVICAL FIVE THROUGH CERVICAL SIX AND CERVICAL SIX THROUGH CERVICAL SEVEN;  Surgeon: Kerrin Champagne, MD;  Location: [facility] OR;  Service: Orthopedics;  Laterality: N/A;   BRONCHIAL BRUSHINGS  04/28/2022   Procedure: BRONCHIAL BRUSHINGS;  Surgeon: Leslye Peer, MD;  Location: [facility] ENDOSCOPY;  Service: Pulmonary;;    BRONCHIAL NEEDLE ASPIRATION BIOPSY  04/28/2022   Procedure: BRONCHIAL NEEDLE ASPIRATION BIOPSIES;  Surgeon: Leslye Peer, MD;  Location: [facility] ENDOSCOPY;  Service: Pulmonary;;   COLONOSCOPY     CYSTOSCOPY     [place]   KNEE ARTHROSCOPY WITH MEDIAL MENISECTOMY Left 01/02/2017   Procedure: LEFT KNEE ARTHROSCOPY WITH PARTIAL MEDIAL MENISCECTOMY;  Surgeon: Kathryne Hitch, MD;  Location: [facility] ORS;  Service: Orthopedics;  Laterality: Left;   POSTERIOR CERVICAL FUSION/FORAMINOTOMY N/A 08/20/2020   Procedure: LEFT CERVICAL SIX THROUGH SEVEN  AND CERVICAL SEVEN THROUGH THORACIC ONE  FORAMINOTOMIES WITH EXCISION OF DISC HERNIATION LEFT CERVICAL SEVEN THROUGH THORACIC ONE;  Surgeon: Kerrin Champagne, MD;  Location: [facility] OR;  Service: Orthopedics;  Laterality: N/A;   TONSILLECTOMY     TURBINATE RESECTION  2007   UVULOPALATOPHARYNGOPLASTY     VIDEO BRONCHOSCOPY WITH RADIAL ENDOBRONCHIAL ULTRASOUND  04/28/2022   Procedure: VIDEO BRONCHOSCOPY WITH RADIAL ENDOBRONCHIAL ULTRASOUND;  Surgeon: Leslye Peer, MD;  Location: [facility] ENDOSCOPY;  Service: Pulmonary;;     FHx:    Reviewed / unchanged   SHx:    Reviewed / unchanged    Systems Review:  Constitutional: Denies fever, chills, wt changes, headaches, insomnia, fatigue, night sweats, change in appetite. Eyes: Denies redness, blurred vision, diplopia, discharge, itchy, watery eyes.  ENT: Denies discharge, congestion, post nasal drip, epistaxis, sore throat, earache, hearing loss, dental pain, tinnitus, vertigo, sinus pain, snoring.  CV: Denies chest pain, palpitations, irregular heartbeat, syncope, dyspnea, diaphoresis, orthopnea, PND, claudication or edema. Respiratory: denies cough, dyspnea, DOE, pleurisy, hoarseness, laryngitis, wheezing.  Gastrointestinal: Denies dysphagia, odynophagia, heartburn, reflux, water brash, abdominal pain or cramps, nausea, vomiting, bloating, diarrhea, constipation, hematemesis, melena, hematochezia  or  hemorrhoids. Genitourinary: Denies dysuria, frequency, urgency, nocturia, hesitancy, discharge, hematuria or flank pain. Musculoskeletal: Denies arthralgias, myalgias, stiffness, jt. swelling, pain, limping or strain/sprain.  Skin: Denies pruritus, rash, hives, warts,  acne, eczema or change in skin lesion(s). Neuro: No weakness, tremor, incoordination, spasms, paresthesia or pain. Psychiatric: Denies confusion, memory loss or sensory loss. Endo: Denies change in weight, skin or hair change.  Heme/Lymph: No excessive bleeding, bruising or enlarged lymph nodes.   Physical Exam  There were no vitals taken for this visit.  Appears  well nourished, well groomed  and in no distress.  Eyes: PERRLA, EOMs, conjunctiva no swelling or erythema. Sinuses: No frontal/maxillary tenderness ENT/Mouth: EAC's clear, TM's nl w/o erythema, bulging. Nares clear w/o erythema, swelling, exudates. Oropharynx clear without erythema or exudates. Oral hygiene is good. Tongue normal, non obstructing. Hearing intact.  Neck: Supple. Thyroid not palpable. Car 2+/2+ without bruits, nodes or JVD. Chest: Respirations nl with BS clear & equal w/o rales, rhonchi, wheezing or stridor.  Cor: Heart sounds normal w/ regular rate and rhythm without sig. murmurs, gallops, clicks or rubs. Peripheral pulses normal and equal  without edema.  Abdomen: Soft & bowel sounds normal. Non-tender w/o guarding, rebound, hernias, masses or organomegaly.  Lymphatics: Unremarkable.  Musculoskeletal: Full ROM all peripheral extremities, joint stability, 5/5 strength and normal gait.  Skin: Warm, dry without exposed rashes, lesions or ecchymosis apparent.  Neuro: Cranial nerves intact, reflexes equal bilaterally. Sensory-motor testing grossly intact. Tendon reflexes grossly intact.  Pysch: Alert & oriented x 3.  Insight and judgement nl & appropriate. No ideations.   Assessment and Plan:  - Continue medication, monitor blood pressure at home.   - Continue DASH diet.  Reminder to go to the ER if any CP,  SOB, nausea, dizziness, severe HA, changes vision/speech.  - Continue diet/meds, exercise,& lifestyle modifications.  - Continue monitor periodic cholesterol/liver & renal functions   1. Essential hypertension  - CBC with Differential/Platelet - COMPLETE METABOLIC PANEL WITH GFR - Magnesium - TSH  2. Hyperlipidemia associated with type 2 diabetes mellitus (HCC)  - Lipid panel - TSH  3. Type 2 diabetes mellitus with stage 1 chronic kidney disease, without long-term current use of insulin (HCC)  - Hemoglobin A1c  4. Morbid obesity with BMI of 45.0-49.9, adult (HCC)  - TSH  5. Vitamin D deficiency  - VITAMIN D 25 Hydroxy (Vit-D Deficiency, Fractures)  6. Medication management  - CBC with Differential/Platelet - COMPLETE METABOLIC PANEL WITH GFR - Magnesium - Lipid panel - TSH - Hemoglobin A1c - VITAMIN D 25 Hydroxy (Vit-D Deficiency, Fractures)    - Continue diet, exercise  - Lifestyle modifications.  - Monitor appropriate labs. - Continue supplementation.        Discussed  regular exercise, BP monitoring, weight control to achieve/maintain BMI less than 25 and discussed med and SE's. Recommended labs to assess /monitor clinical status .  I discussed the assessment and treatment plan with the patient. The patient was provided an opportunity to ask questions and all were answered. The patient agreed with the plan and demonstrated an understanding of the instructions.  I provided over 30 minutes of exam, counseling, chart review and  complex critical decision making.        The patient was advised to call back or seek an in-person evaluation if the symptoms worsen or if the condition fails to improve as anticipated.   Mark Maw, MD

## 2022-10-14 ENCOUNTER — Encounter: Payer: Self-pay | Admitting: Internal Medicine

## 2022-10-14 ENCOUNTER — Ambulatory Visit (INDEPENDENT_AMBULATORY_CARE_PROVIDER_SITE_OTHER): Payer: BC Managed Care – PPO | Admitting: Internal Medicine

## 2022-10-14 VITALS — BP 158/88 | HR 83 | Temp 97.9°F | Resp 18 | Ht 72.0 in | Wt 334.2 lb

## 2022-10-14 DIAGNOSIS — E1169 Type 2 diabetes mellitus with other specified complication: Secondary | ICD-10-CM | POA: Diagnosis not present

## 2022-10-14 DIAGNOSIS — E1122 Type 2 diabetes mellitus with diabetic chronic kidney disease: Secondary | ICD-10-CM

## 2022-10-14 DIAGNOSIS — Z79899 Other long term (current) drug therapy: Secondary | ICD-10-CM

## 2022-10-14 DIAGNOSIS — I10 Essential (primary) hypertension: Secondary | ICD-10-CM

## 2022-10-14 DIAGNOSIS — N181 Chronic kidney disease, stage 1: Secondary | ICD-10-CM

## 2022-10-14 DIAGNOSIS — E559 Vitamin D deficiency, unspecified: Secondary | ICD-10-CM

## 2022-10-14 DIAGNOSIS — E785 Hyperlipidemia, unspecified: Secondary | ICD-10-CM | POA: Diagnosis not present

## 2022-10-14 LAB — CBC WITH DIFFERENTIAL/PLATELET
Absolute Monocytes: 1206 cells/uL — ABNORMAL HIGH (ref 200–950)
Eosinophils Absolute: 360 cells/uL (ref 15–500)
HCT: 46.8 % (ref 38.5–50.0)
Hemoglobin: 15.4 g/dL (ref 13.2–17.1)
Lymphs Abs: 2935 cells/uL (ref 850–3900)
MCV: 87.2 fL (ref 80.0–100.0)
RDW: 13.8 % (ref 11.0–15.0)

## 2022-10-15 LAB — COMPLETE METABOLIC PANEL WITH GFR
AG Ratio: 1.8 (calc) (ref 1.0–2.5)
ALT: 16 U/L (ref 9–46)
AST: 14 U/L (ref 10–35)
Albumin: 4.2 g/dL (ref 3.6–5.1)
Alkaline phosphatase (APISO): 71 U/L (ref 35–144)
BUN/Creatinine Ratio: 32 (calc) — ABNORMAL HIGH (ref 6–22)
BUN: 26 mg/dL — ABNORMAL HIGH (ref 7–25)
CO2: 30 mmol/L (ref 20–32)
Calcium: 9.8 mg/dL (ref 8.6–10.3)
Chloride: 101 mmol/L (ref 98–110)
Creat: 0.81 mg/dL (ref 0.70–1.35)
Globulin: 2.4 g/dL (calc) (ref 1.9–3.7)
Glucose, Bld: 75 mg/dL (ref 65–99)
Potassium: 4.9 mmol/L (ref 3.5–5.3)
Sodium: 142 mmol/L (ref 135–146)
Total Bilirubin: 1.2 mg/dL (ref 0.2–1.2)
Total Protein: 6.6 g/dL (ref 6.1–8.1)
eGFR: 100 mL/min/{1.73_m2} (ref >=60)

## 2022-10-15 LAB — HEMOGLOBIN A1C
Hgb A1c MFr Bld: 5.4 % of total Hgb (ref <5.7)
Mean Plasma Glucose: 108 mg/dL
eAG (mmol/L): 6 mmol/L

## 2022-10-15 LAB — CBC WITH DIFFERENTIAL/PLATELET
Basophils Absolute: 116 cells/uL (ref 0–200)
Basophils Relative: 1 %
Eosinophils Relative: 3.1 %
MCH: 28.7 pg (ref 27.0–33.0)
MCHC: 32.9 g/dL (ref 32.0–36.0)
MPV: 11 fL (ref 7.5–12.5)
Monocytes Relative: 10.4 %
Neutro Abs: 6983 cells/uL (ref 1500–7800)
Neutrophils Relative %: 60.2 %
Platelets: 277 10*3/uL (ref 140–400)
RBC: 5.37 10*6/uL (ref 4.20–5.80)
Total Lymphocyte: 25.3 %
WBC: 11.6 10*3/uL — ABNORMAL HIGH (ref 3.8–10.8)

## 2022-10-15 LAB — TSH: TSH: 1.01 mIU/L (ref 0.40–4.50)

## 2022-10-15 LAB — VITAMIN D 25 HYDROXY (VIT D DEFICIENCY, FRACTURES): Vit D, 25-Hydroxy: 100 ng/mL (ref 30–100)

## 2022-10-15 LAB — MAGNESIUM: Magnesium: 2.3 mg/dL (ref 1.5–2.5)

## 2022-10-15 LAB — LIPID PANEL
Cholesterol: 162 mg/dL (ref <200)
HDL: 35 mg/dL — ABNORMAL LOW (ref >=40)
LDL Cholesterol (Calc): 94 mg/dL (calc)
Non-HDL Cholesterol (Calc): 127 mg/dL (calc) (ref <130)
Total CHOL/HDL Ratio: 4.6 (calc) (ref <5.0)
Triglycerides: 236 mg/dL — ABNORMAL HIGH (ref <150)

## 2022-10-15 NOTE — Progress Notes (Signed)
^<^<^<^<^<^<^<^<^<^<^<^<^<^<^<^<^<^<^<^<^<^<^<^<^<^<^<^<^<^<^<^<^<^<^<^<^ ^>^>^>^>^>^>^>^>^>^>^>>^>^>^>^>^>^>^>^>^>^>^>^>^>^>^>^>^>^>^>^>^>^>^>^>^>  -  Test results slightly outside the reference range are not unusual. If there is anything important, I will review this with you,  otherwise it is considered normal test values.  If you have further questions,  please do not hesitate to contact me at the office or via My Chart.   ^<^<^<^<^<^<^<^<^<^<^<^<^<^<^<^<^<^<^<^<^<^<^<^<^<^<^<^<^<^<^<^<^<^<^<^<^ ^>^>^>^>^>^>^>^>^>^>^>^>^>^>^>^>^>^>^>^>^>^>^>^>^>^>^>^>^>^>^>^>^>^>^>^>^  -   Chol = 162   &   LDL Chol = 94   - Both  Excellent   - Very low risk for Heart Attack  / Stroke ^>^>^>^>^>^>^>^>^>^>^>^>^>^>^>^>^>^>^>^>^>^>^>^>^>^>^>^>^>^>^>^>^>^>^>^>^  -   But Triglycerides ( = 236 ) or fats in blood are too high                 (   Ideal or  Goal is less than 150  !  )    - Recommend avoid fried & greasy foods,  sweets / candy,   - Avoid white rice  (brown or wild rice or Quinoa is OK),   - Avoid white potatoes  (sweet potatoes are OK)   - Avoid anything made from white flour  - bagels, doughnuts, rolls, buns, biscuits, white and   wheat breads, pizza crust and traditional  pasta made of white flour & egg white  - (vegetarian pasta or spinach or wheat pasta is OK).    - Multi-grain bread is OK - like multi-grain flat bread or  sandwich thins.   - Avoid alcohol in excess.   - Exercise is also important. ^>^>^>^>^>^>^>^>^>^>^>^>^>^>^>^>^>^>^>^>^>^>^>^>^>^>^>^>^>^>^>^>^>^>^>^>^ ^>^>^>^>^>^>^>^>^>^>^>^>^>^>^>^>^>^>^>^>^>^>^>^>^>^>^>^>^>^>^>^>^>^>^>^>^  -   A1c - Normal - No Diabetes  - Great !  ^>^>^>^>^>^>^>^>^>^>^>^>^>^>^>^>^>^>^>^>^>^>^>^>^>^>^>^>^>^>^>^>^>^>^>^>^  -   Vitamin D = 100   -    Wonderful - Please continue dose same  ^>^>^>^>^>^>^>^>^>^>^>^>^>^>^>^>^>^>^>^>^>^>^>^>^>^>^>^>^>^>^>^>^>^>^>^>^  -   All Else - CBC - Kidneys - Electrolytes - Liver - Magnesium & Thyroid     - all  Normal / OK ^>^>^>^>^>^>^>^>^>^>^>^>^>^>^>^>^>^>^>^>^>^>^>^>^>^>^>^>^>^>^>^>^>^>^>^>^ ^>^>^>^>^>^>^>^>^>^>^>^>^>^>^>^>^>^>^>^>^>^>^>^>^>^>^>^>^>^>^>^>^>^>^>^>^

## 2022-10-31 ENCOUNTER — Encounter: Payer: Self-pay | Admitting: Allergy & Immunology

## 2022-10-31 ENCOUNTER — Other Ambulatory Visit: Payer: Self-pay

## 2022-10-31 ENCOUNTER — Encounter: Payer: Self-pay | Admitting: Emergency Medicine

## 2022-10-31 MED ORDER — TRELEGY ELLIPTA 100-62.5-25 MCG/ACT IN AEPB: 1.0000 | INHALATION_SPRAY | Freq: Every day | RESPIRATORY_TRACT | 0 refills | Status: DC

## 2022-10-31 MED ORDER — IPRATROPIUM-ALBUTEROL 0.5-2.5 (3) MG/3ML IN SOLN: 3.0000 mL | Freq: Two times a day (BID) | RESPIRATORY_TRACT | 0 refills | Status: DC

## 2022-11-07 ENCOUNTER — Other Ambulatory Visit: Payer: Self-pay | Admitting: Internal Medicine

## 2022-11-07 ENCOUNTER — Other Ambulatory Visit: Payer: Self-pay | Admitting: Nurse Practitioner

## 2022-11-07 DIAGNOSIS — K638219 Small intestinal bacterial overgrowth, unspecified: Secondary | ICD-10-CM | POA: Diagnosis not present

## 2022-11-07 DIAGNOSIS — R142 Eructation: Secondary | ICD-10-CM | POA: Diagnosis not present

## 2022-11-07 DIAGNOSIS — R140 Abdominal distension (gaseous): Secondary | ICD-10-CM | POA: Diagnosis not present

## 2022-11-07 DIAGNOSIS — R197 Diarrhea, unspecified: Secondary | ICD-10-CM | POA: Diagnosis not present

## 2022-11-07 DIAGNOSIS — R059 Cough, unspecified: Secondary | ICD-10-CM

## 2022-11-07 DIAGNOSIS — S161XXD Strain of muscle, fascia and tendon at neck level, subsequent encounter: Secondary | ICD-10-CM

## 2022-11-12 DIAGNOSIS — F1211 Cannabis abuse, in remission: Secondary | ICD-10-CM | POA: Diagnosis not present

## 2022-11-14 DIAGNOSIS — F1211 Cannabis abuse, in remission: Secondary | ICD-10-CM | POA: Diagnosis not present

## 2022-11-15 ENCOUNTER — Other Ambulatory Visit: Payer: Self-pay | Admitting: Orthopaedic Surgery

## 2022-11-20 ENCOUNTER — Other Ambulatory Visit (HOSPITAL_COMMUNITY): Payer: Self-pay

## 2022-11-24 ENCOUNTER — Other Ambulatory Visit: Payer: Self-pay

## 2022-12-07 ENCOUNTER — Other Ambulatory Visit: Payer: Self-pay | Admitting: Nurse Practitioner

## 2022-12-07 DIAGNOSIS — S161XXD Strain of muscle, fascia and tendon at neck level, subsequent encounter: Secondary | ICD-10-CM

## 2022-12-11 ENCOUNTER — Ambulatory Visit: Payer: BC Managed Care – PPO | Admitting: Allergy & Immunology

## 2022-12-11 DIAGNOSIS — K5730 Diverticulosis of large intestine without perforation or abscess without bleeding: Secondary | ICD-10-CM | POA: Diagnosis not present

## 2022-12-11 DIAGNOSIS — Z79899 Other long term (current) drug therapy: Secondary | ICD-10-CM | POA: Diagnosis not present

## 2022-12-11 DIAGNOSIS — K648 Other hemorrhoids: Secondary | ICD-10-CM | POA: Diagnosis not present

## 2022-12-11 DIAGNOSIS — Z1211 Encounter for screening for malignant neoplasm of colon: Secondary | ICD-10-CM | POA: Diagnosis not present

## 2022-12-11 DIAGNOSIS — Z86010 Personal history of colonic polyps: Secondary | ICD-10-CM | POA: Diagnosis not present

## 2022-12-12 LAB — HM COLONOSCOPY

## 2022-12-17 ENCOUNTER — Encounter: Payer: Self-pay | Admitting: Internal Medicine

## 2022-12-18 ENCOUNTER — Other Ambulatory Visit (HOSPITAL_COMMUNITY): Payer: Self-pay

## 2022-12-22 ENCOUNTER — Ambulatory Visit: Payer: BC Managed Care – PPO | Admitting: Adult Health

## 2022-12-22 DIAGNOSIS — G43709 Chronic migraine without aura, not intractable, without status migrainosus: Secondary | ICD-10-CM | POA: Diagnosis not present

## 2022-12-22 MED ORDER — ONABOTULINUMTOXINA 200 UNITS IJ SOLR
155.0000 [IU] | Freq: Once | INTRAMUSCULAR | Status: AC
Start: 2022-12-22 — End: 2022-12-22
  Administered 2022-12-22: 155 [IU] via INTRAMUSCULAR

## 2022-12-22 NOTE — Progress Notes (Signed)
Botox- 200 units x 1 vial Lot: [id number] Expiration: 04/2025 NDC: 1610-9604-54  Bacteriostatic 0.9% Sodium Chloride-  4 mL  Lot: [id number] Expiration:03/26/2024 NDC: 1478-2956-21   Dx:G43.709 B/B Witnessed by [employer]

## 2022-12-22 NOTE — Progress Notes (Signed)
     12/22/22: First botox with our office. When getting botox through [facility] it was beneficial for his migraines.   BOTOX PROCEDURE NOTE FOR MIGRAINE HEADACHE    Contraindications and precautions discussed with patient(above). Aseptic procedure was observed and patient tolerated procedure. Procedure performed by Butch Penny, NP  The condition has existed for more than 6 months, and pt does not have a diagnosis of ALS, Myasthenia Gravis or Lambert-Eaton Syndrome.  Risks and benefits of injections discussed and pt agrees to proceed with the procedure.  Written consent obtained    Indication/Diagnosis: chronic migraine BOTOX(J0585) injection was performed according to protocol by [employer]. 200 units of BOTOX was dissolved into 4 cc NS.   NDC: [address]-69  Type of toxin: Botox     Botox- 200 units x 1 vial Lot: [id number] Expiration: 04/2025 NDC: 6295-2841-32   Bacteriostatic 0.9% Sodium Chloride-  4 mL  Lot: [id number] Expiration:03/26/2024 NDC: 7253-6644-03    Dx:G43.709      Description of procedure:  The patient was placed in a sitting position. The standard protocol was used for Botox as follows, with 5 units of Botox injected at each site:   -Procerus muscle, midline injection  -Corrugator muscle, bilateral injection  -Frontalis muscle, bilateral injection, with 2 sites each side, medial injection was performed in the upper one third of the frontalis muscle, in the region vertical from the medial inferior edge of the superior orbital rim. The lateral injection was again in the upper one third of the forehead vertically above the lateral limbus of the cornea, 1.5 cm lateral to the medial injection site.  -Temporalis muscle injection, 4 sites, bilaterally. The first injection was 3 cm above the tragus of the ear, second injection site was 1.5 cm to 3 cm up from the first injection site in line with the tragus of the ear. The third injection site was 1.5-3 cm forward  between the first 2 injection sites. The fourth injection site was 1.5 cm posterior to the second injection site.  -Occipitalis muscle injection, 3 sites, bilaterally. The first injection was done one half way between the occipital protuberance and the tip of the mastoid process behind the ear. The second injection site was done lateral and superior to the first, 1 fingerbreadth from the first injection. The third injection site was 1 fingerbreadth superiorly and medially from the first injection site.  -Cervical paraspinal muscle injection, 2 sites, bilateral knee first injection site was 1 cm from the midline of the cervical spine, 3 cm inferior to the lower border of the occipital protuberance. The second injection site was 1.5 cm superiorly and laterally to the first injection site.  -Trapezius muscle injection was performed at 3 sites, bilaterally. The first injection site was in the upper trapezius muscle halfway between the inflection point of the neck, and the acromion. The second injection site was one half way between the acromion and the first injection site. The third injection was done between the first injection site and the inflection point of the neck.   Will return for repeat injection in 3 months.   A 200 unit sof Botox was used, 155 units were injected, the rest of the Botox was wasted. The patient tolerated the procedure well, there were no complications of the above procedure.  Butch Penny, MSN, NP-C 12/22/2022, 3:07 PM [facility] [address] [place], [place] [address] [phone]

## 2022-12-24 ENCOUNTER — Encounter: Payer: Self-pay | Admitting: Orthopaedic Surgery

## 2022-12-25 ENCOUNTER — Telehealth: Payer: Self-pay

## 2022-12-25 NOTE — Telephone Encounter (Signed)
VOB submitted for Orthov[facility], bilateral knee 

## 2023-01-13 ENCOUNTER — Ambulatory Visit: Payer: BC Managed Care – PPO | Admitting: Podiatry

## 2023-01-14 ENCOUNTER — Other Ambulatory Visit: Payer: Self-pay | Admitting: Nurse Practitioner

## 2023-01-14 ENCOUNTER — Other Ambulatory Visit: Payer: Self-pay | Admitting: Internal Medicine

## 2023-01-14 ENCOUNTER — Other Ambulatory Visit: Payer: Self-pay | Admitting: Allergy & Immunology

## 2023-01-14 DIAGNOSIS — S161XXD Strain of muscle, fascia and tendon at neck level, subsequent encounter: Secondary | ICD-10-CM

## 2023-01-15 ENCOUNTER — Other Ambulatory Visit: Payer: Self-pay

## 2023-01-15 ENCOUNTER — Ambulatory Visit: Payer: BC Managed Care – PPO | Admitting: Allergy & Immunology

## 2023-01-15 VITALS — BP 148/110 | HR 86 | Ht 72.0 in | Wt 342.1 lb

## 2023-01-15 DIAGNOSIS — J4489 Other specified chronic obstructive pulmonary disease: Secondary | ICD-10-CM

## 2023-01-15 DIAGNOSIS — K219 Gastro-esophageal reflux disease without esophagitis: Secondary | ICD-10-CM

## 2023-01-15 DIAGNOSIS — B999 Unspecified infectious disease: Secondary | ICD-10-CM | POA: Diagnosis not present

## 2023-01-15 DIAGNOSIS — H6691 Otitis media, unspecified, right ear: Secondary | ICD-10-CM

## 2023-01-15 DIAGNOSIS — J3089 Other allergic rhinitis: Secondary | ICD-10-CM | POA: Diagnosis not present

## 2023-01-15 DIAGNOSIS — L2084 Intrinsic (allergic) eczema: Secondary | ICD-10-CM | POA: Diagnosis not present

## 2023-01-15 DIAGNOSIS — R918 Other nonspecific abnormal finding of lung field: Secondary | ICD-10-CM

## 2023-01-15 DIAGNOSIS — J302 Other seasonal allergic rhinitis: Secondary | ICD-10-CM

## 2023-01-15 MED ORDER — EPINEPHRINE 0.3 MG/0.3ML IJ SOAJ: 0.3000 mg | INTRAMUSCULAR | Status: DC | PRN

## 2023-01-15 MED ORDER — CETIRIZINE HCL 10 MG PO TABS: 10.0000 mg | ORAL_TABLET | Freq: Every morning | ORAL | Status: DC

## 2023-01-15 MED ORDER — AZELASTINE HCL 137 MCG/SPRAY NA SOLN: 2.0000 | Freq: Two times a day (BID) | NASAL | 1 refills | Status: DC

## 2023-01-15 MED ORDER — ALBUTEROL SULFATE HFA 108 (90 BASE) MCG/ACT IN AERS: 2.0000 | INHALATION_SPRAY | RESPIRATORY_TRACT | 1 refills | Status: DC | PRN

## 2023-01-15 MED ORDER — IPRATROPIUM-ALBUTEROL 0.5-2.5 (3) MG/3ML IN SOLN: 3.0000 mL | Freq: Two times a day (BID) | RESPIRATORY_TRACT | 1 refills | Status: DC | PRN

## 2023-01-15 MED ORDER — BUDESONIDE 0.5 MG/2ML IN SUSP: 0.5000 mg | Freq: Two times a day (BID) | RESPIRATORY_TRACT | 1 refills | Status: DC | PRN

## 2023-01-15 MED ORDER — TRELEGY ELLIPTA 100-62.5-25 MCG/ACT IN AEPB: 1.0000 | INHALATION_SPRAY | Freq: Every day | RESPIRATORY_TRACT | 0 refills | Status: DC

## 2023-01-15 MED ORDER — IPRATROPIUM BROMIDE 0.03 % NA SOLN: 2.0000 | Freq: Three times a day (TID) | NASAL | 2 refills | Status: DC

## 2023-01-15 NOTE — Progress Notes (Signed)
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 - with several breakthrough infections as of late with multiple hospitalizations (has been on doxycycline for a few years, but we may need to consider imunoglobulin supplementation   Asthma-COPD overlap syndrome with worsening pulmonary status since contracting COVID pneumonia in January 2023  Previously doing well on Nucala and then Dupixent (stopped January 2023 after his COVID PNA)  Bilateral pulmonary nodules - benign biopsies in December 2023   Hymenoptera allergy - EpiPen up to date   GERD - on PRN H2 blocker    Obesity - with resulting nerve impingement and joint pain   Chronic back pain - s/p recent neck surgery   Migraines - on botulinum injections   Polypharmacy   Normal echocardiogram (May 2023)   Recent passing of his mother    Plan/Recommendations:   1. 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 - We many need to consider starting immunoglobulin replacement in the future, especially in light of the fact that you are continuing with multiple infections.  2. Asthma-COPD overlap syndrome - Lung testing looks a bit worse today. - I am going to talk to Dr. Delton Coombes to see what he thinks about restarting your biologic.  - We might add more inhaled steroid to your regimen if Dr. Delton Coombes does not want to add the injectable medication.  - Daily controller medication(s): Trelegy one puff once daily - Prior to physical activity: albuterol 2 puffs 10-15 minutes before physical activity. - Rescue medications: albuterol 4 puffs every 4-6 hours as needed or DuoNeb nebulizer one vial every 4-6 hours as needed - Asthma  control goals:  * Full participation in all desired activities (may need albuterol before activity) * Albuterol use two time or less a week on average (not counting use with activity) * Cough interfering with sleep two time or less a month * Oral steroids no more than once a year * No hospitalizations   3. Chronic allergic rhinitis (sweet vernal grass, box elder, cat, weeds, ragweed, molds, cockroach, dust mite) - Continue with azelastine 2 sprays per nostril up to twice daily. - Continue with ipratropium one spray per nostril every 8 hours as needed for runny noses.  - Continue with saline mist 1-2 times daily.  - Continue with cetirizine 10mg  daily as needed for breakthrough symptoms. - Continue with montelukast 10mg  in the evening.  - Continue with doxycycline 100mg  twice daily.   4. GERD - Continue famotidine as needed.  5. Return in about 3 months (around 04/17/2023).    Subjective:   Mark Harrell. is a 62 y.o. male presenting today for follow up of  Chief Complaint  Patient presents with   Follow-up   Medication Refill    Kristoph Maheshwari. has a history of the following: Patient Active Problem List   Diagnosis Date Noted   Unilateral primary osteoarthritis, left knee 02/24/2022   Unilateral primary osteoarthritis, right knee 02/24/2022   Abnormal CT of the chest 01/28/2022   Hypokalemia 09/26/2021   Dyspnea 09/25/2021   Acute deep vein thrombosis (DVT) of right lower extremity (HCC) 09/24/2021   OSA (obstructive sleep apnea) 09/24/2021   Herniation of  cervical intervertebral disc with radiculopathy    Status post cervical discectomy 08/20/2020   Tracheomalacia 05/26/2019   Acquired tracheomalacia 05/26/2019   HNP (herniated nucleus pulposus) with myelopathy, cervical 05/23/2019    Class: Chronic   Spinal stenosis of cervical region 05/23/2019    Class: Chronic   Status post cervical spinal fusion 05/23/2019   Chronic respiratory failure with hypoxia (HCC)  04/12/2018   Bilateral lower extremity edema 12/29/2017   Hymenoptera allergy 11/10/2017   Family history of colonic polyps 08/07/2017   Myofascial pain 08/06/2017   Seasonal and perennial allergic rhinitis 04/23/2017   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/2017   Cervicalgia 04/01/2017   Recurrent pneumonia 03/09/2017   Other spondylosis with radiculopathy, cervical region 02/09/2017   Depression with anxiety 01/04/2017   Memory difficulty 12/05/2016   Chronic migraine without aura without status migrainosus, not intractable 10/30/2016   Lumbar radiculopathy 05/15/2016   Osteoarthritis of spine with radiculopathy, lumbar region 05/15/2016   Risk for falls 05/15/2016   Recurrent infections 03/29/2016   Chronic nonseasonal allergic rhinitis due to pollen 03/29/2016   Polypharmacy 01/16/2016   Morbid obesity with BMI of 45.0-49.9, adult (HCC) 03/06/2015   SDAT 02/05/2015   OSA and COPD overlap syndrome (HCC) 02/05/2015   Medication management 08/02/2014   GERD (gastroesophageal reflux disease) 05/09/2014   Vitamin D deficiency 08/01/2013   Prediabetes 08/01/2013   Positive TB test 07/29/2011   Diverticula of colon 05/07/2011   Hypertension 01/31/2011   Hyperlipidemia, mixed 01/31/2011   BPH (benign prostatic hyperplasia) 01/31/2011   Testosterone Deficiency 01/31/2011   IBS (irritable bowel syndrome) 01/31/2011   Partial complex seizure disorder with intractable epilepsy (HCC) 01/31/2011   Depression, major, recurrent, in partial remission (HCC) 01/31/2011   Asthma-COPD overlap syndrome 01/31/2011    History obtained from: chart review and patient.  Serafim is a 62 y.o. male presenting for a follow up visit.  He was last seen in January 2024.  At that time, we continue with doxycycline 100 mg twice daily.  We did talk about immunoglobulin replacement, but he preferred to hold off.  For his asthma COPD overlap, his lung testing looked much better.  We continue with Trelegy 1 puff once daily and  albuterol as needed.  We did not start a biologic because we are trying to figure out what was going on with his underlying lungs.  For his allergic rhinitis, we continue with Dymista 2 sprays per nostril twice daily as well as cetirizine, montelukast, and nasal saline rinses.  He had a sinus CT in November 2023 that was normal.  GERD was controlled with famotidine.  Since last visit, he has remained stable.   Asthma/Respiratory Symptom History: He thinks this his breathing is not as good as it was in the past. He does have an oxygen tank at home. He has something to wear to do continuous blood oxygen monitoring. He has noticed that he is dipping down into the mid 70% at night. He has not talked to Dr. Delton Coombes about it. He remains on the Trelegy one puff once daily. He does need some albuterol neb solution was getting difficult to find.   He continues to follow with Dr. Delton Coombes.  She has a CT chest scheduled for September 2024.  His last appointment with him was in March 2024.  At that time, his chest CT was unchanged.  He had a reassuring bronchoscopy in December 2023.  His plan was to repeat the chest CT in September 2024 and if there was  any change then consider a repeat bronchoscopy for biopsy.  His nodules are and a difficult to access position, therefore biopsy would be difficult.  He started having breathing problems during the procedure so he only got one lung finished. The PET scans were OK which is good news. The most recent CT   Allergic Rhinitis Symptom History: He remains on the Astelin as well as ipratropium.  He is also on the cetirizine and the montelukast daily. He feels that his environmental allergies are under good control.  He remains on the doxycycline BID. He has had some infections but he is barely leaving the house at this point. He did have an ear infections since the last visit and an upper respiratory infection. He has not been on IV antibiotics for any infections at all. We have  never started him on immunoglobulin replacement.   Skin Symptom History: He does think that his skin was under better control  when he was on the Dupixent. He is not sure whether the Dupixent or the Nucala controlled his breathing better, but he certainly feels that his breathing has become worse without having the biologic on board. He is open top restarting a biologic.   GERD Symptom History: He remains on the famotidine as needed for his reflux. His reflux is under excellent control at this point.   He is still having some trouble with his sister, who is in charge of his late mother's estate. She is refusing to pay for any updates to his mother's home. He is still living there and there is a lot of delayed maintenance. He is rather frustrated with this. He still has his own home in [place] that he has not lived in for over 15 years.   Otherwise, there have been no changes to his past medical history, surgical history, family history, or social history.    Review of systems otherwise negative other than that mentioned in the HPI.    Objective:   Blood pressure (!) 148/110, pulse 86, height 6' (1.829 m), weight (!) 342 lb 1.6 oz (155.2 kg), SpO2 95%. Body mass index is 46.4 kg/m.    Physical Exam Vitals reviewed.  Constitutional:      Appearance: He is well-developed. He is morbidly obese. He is not ill-appearing or toxic-appearing.     Comments: He does seem more frail today.   HENT:     Head: Normocephalic and atraumatic.     Right Ear: Tympanic membrane, ear canal and external ear normal.     Left Ear: Tympanic membrane, ear canal and external ear normal.     Nose: No nasal deformity, septal deviation, mucosal edema or rhinorrhea.     Right Turbinates: Enlarged, swollen and pale.     Left Turbinates: Enlarged, swollen and pale.     Right Sinus: No maxillary sinus tenderness or frontal sinus tenderness.     Left Sinus: No maxillary sinus tenderness or frontal sinus  tenderness.     Comments: No nasal polyps.     Mouth/Throat:     Lips: Pink.     Mouth: Mucous membranes are moist. Mucous membranes are not pale and not dry.     Pharynx: Uvula midline.  Eyes:     General: Lids are normal. Allergic shiner present.        Right eye: No discharge.        Left eye: No discharge.     Conjunctiva/sclera: Conjunctivae normal.     Right eye: Right  conjunctiva is not injected. No chemosis.    Left eye: Left conjunctiva is not injected. No chemosis.    Pupils: Pupils are equal, round, and reactive to light.  Cardiovascular:     Rate and Rhythm: Normal rate and regular rhythm.     Heart sounds: Normal heart sounds.  Pulmonary:     Effort: Pulmonary effort is normal. No tachypnea, accessory muscle usage or respiratory distress.     Breath sounds: Normal breath sounds. Decreased air movement and transmitted upper airway sounds present. No wheezing, rhonchi or rales.     Comments: Decreased air movement at the bases.  Chest:     Chest wall: No tenderness.  Lymphadenopathy:     Cervical: No cervical adenopathy.  Skin:    Coloration: Skin is not pale.     Findings: No abrasion, erythema, petechiae or rash. Rash is not papular, urticarial or vesicular.  Neurological:     Mental Status: He is alert.  Psychiatric:        Behavior: Behavior is cooperative.      Diagnostic studies:    Spirometry: results abnormal (FEV1: 1.89/51%, FVC: 2.83/58%, FEV1/FVC: 67%).    Spirometry consistent with possible restrictive disease.   Allergy Studies: none        Malachi Bonds, MD  [facility]

## 2023-01-15 NOTE — Patient Instructions (Addendum)
1. 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 - We many need to consider starting immunoglobulin replacement in the future, especially in light of the fact that you are continuing with multiple infections.  2. Asthma-COPD overlap syndrome - Lung testing looks a bit worse today. - I am going to talk to Dr. Delton Coombes to see what he thinks about restarting your biologic.  - We might add more inhaled steroid to your regimen if Dr. Delton Coombes does not want to add the injectable medication.  - Daily controller medication(s): Trelegy one puff once daily - Prior to physical activity: albuterol 2 puffs 10-15 minutes before physical activity. - Rescue medications: albuterol 4 puffs every 4-6 hours as needed or DuoNeb nebulizer one vial every 4-6 hours as needed - Asthma control goals:  * Full participation in all desired activities (may need albuterol before activity) * Albuterol use two time or less a week on average (not counting use with activity) * Cough interfering with sleep two time or less a month * Oral steroids no more than once a year * No hospitalizations   3. Chronic allergic rhinitis (sweet vernal grass, box elder, cat, weeds, ragweed, molds, cockroach, dust mite) - Continue with azelastine 2 sprays per nostril up to twice daily. - Continue with ipratropium one spray per nostril every 8 hours as needed for runny noses.  - Continue with saline mist 1-2 times daily.  - Continue with cetirizine 10mg  daily as needed for breakthrough symptoms. - Continue with montelukast 10mg  in the evening.  - Continue with doxycycline 100mg  twice daily.   4. GERD - Continue famotidine as needed.  5. Return in about 3 months (around 04/17/2023).     Please inform us of any Emergency Department visits, hospitalizations, or changes in symptoms. Call us before going to the ED for breathing or allergy  symptoms since we might be able to fit you in for a sick visit. Feel free to contact us anytime with any questions, problems, or concerns.  It was a pleasure to see you again today!  Websites that have reliable patient information: 1. American Academy of Asthma, Allergy, and Immunology: www.aaaai.org 2. Food Allergy Research and Education (FARE): foodallergy.org 3. Mothers of Asthmatics: http://www.asthmacommunitynetwork.org 4. American College of Allergy, Asthma, and Immunology: www.acaai.org   COVID-19 Vaccine Information can be found at: [email or web address] For questions related to vaccine distribution or appointments, please email [email or web address] or call [phone].   We realize that you might be concerned about having an allergic reaction to the COVID19 vaccines. To help with that concern, WE ARE OFFERING THE COVID19 VACCINES IN OUR OFFICE! Ask the front desk for dates!     "Like" [place] on Facebook and Instagram for our latest updates!      A healthy democracy works best when [employer] participate! Make sure you are registered to vote! If you have moved or changed any of your contact information, you will need to get this updated before voting!  In some cases, you MAY be able to register to vote online: [email or web address]

## 2023-01-16 ENCOUNTER — Encounter: Payer: Self-pay | Admitting: Allergy & Immunology

## 2023-01-18 ENCOUNTER — Other Ambulatory Visit: Payer: Self-pay | Admitting: Internal Medicine

## 2023-01-18 DIAGNOSIS — F3341 Major depressive disorder, recurrent, in partial remission: Secondary | ICD-10-CM

## 2023-01-18 DIAGNOSIS — F0150 Vascular dementia without behavioral disturbance: Secondary | ICD-10-CM

## 2023-01-20 ENCOUNTER — Telehealth: Payer: Self-pay

## 2023-01-20 NOTE — Telephone Encounter (Signed)
PA has been submitted online through covermymeds for Orthovisc, bilateral knee. Pending key: [id number]

## 2023-01-21 ENCOUNTER — Ambulatory Visit (INDEPENDENT_AMBULATORY_CARE_PROVIDER_SITE_OTHER): Payer: BC Managed Care – PPO

## 2023-01-21 ENCOUNTER — Ambulatory Visit (HOSPITAL_BASED_OUTPATIENT_CLINIC_OR_DEPARTMENT_OTHER): Payer: BC Managed Care – PPO | Admitting: Student

## 2023-01-21 ENCOUNTER — Encounter (HOSPITAL_BASED_OUTPATIENT_CLINIC_OR_DEPARTMENT_OTHER): Payer: Self-pay | Admitting: Student

## 2023-01-21 DIAGNOSIS — M25561 Pain in right knee: Secondary | ICD-10-CM

## 2023-01-21 DIAGNOSIS — M25562 Pain in left knee: Secondary | ICD-10-CM

## 2023-01-21 DIAGNOSIS — M170 Bilateral primary osteoarthritis of knee: Secondary | ICD-10-CM

## 2023-01-21 MED ORDER — LIDOCAINE HCL 1 % IJ SOLN
4.0000 mL | INTRAMUSCULAR | Status: AC | PRN
Start: 2023-01-21 — End: 2023-01-21
  Administered 2023-01-21: 4 mL

## 2023-01-21 MED ORDER — TRIAMCINOLONE ACETONIDE 40 MG/ML IJ SUSP
2.0000 mL | INTRAMUSCULAR | Status: AC | PRN
Start: 2023-01-21 — End: 2023-01-21
  Administered 2023-01-21: 2 mL via INTRA_ARTICULAR

## 2023-01-21 NOTE — Progress Notes (Signed)
Chief Complaint: Bilateral knee pain     History of Present Illness:    Mark Hornaday. is a 62 y.o. male presenting today for evaluation of pain in both knees.  He is a patient of Dr. Magnus Ivan and has known bilateral osteoarthritis.  He had bilateral Synvisc injections performed on 02/24/2022 which he reports did give him good and long-lasting relief.  Symptoms have begun to recur, so he has submitted for authorization to have these performed again, however is still waiting to hear back.  Pain levels have steadily been increasing mainly in the medial side of both knees.  This is moderate to severe, and he reports significant difficulty with weightbearing.  Has been taking Tylenol and RICE therapy without much relief.  Patient has talked to Dr. Magnus Ivan regarding potential knee replacement surgery and is continuing to work on weight loss to become eligible.   Surgical History:   Left knee arthroscopy with medial menisectomy  PMH/PSH/Family History/Social History/Meds/Allergies:    Past Medical History:  Diagnosis Date   Anesthesia complication requiring reversal agent administration    ? from central apnea, very difficult to get off vent   Anxiety    Arthritis    osteo   Asthma    BPH (benign prostatic hyperplasia)    Complication of anesthesia    difficulty waking , they twlight me because of my respiratory problems "   COPD (chronic obstructive pulmonary disease) (HCC)    COVID    moderate case 2023   Depression    DVT (deep venous thrombosis) (HCC) 2023   right leg   Dyspnea    on exertion   Enlarged heart    Family history of adverse reaction to anesthesia    mother trouble waking up, and heart stopped   GERD (gastroesophageal reflux disease)    Headache    botox injections for headaches   Hyperlipidemia    Hypertension    Hypogonadism male    IBS (irritable bowel syndrome)    Memory difficulties    short term memories    Neuropathy    Obesity    On home oxygen therapy    on 2 liter   OSA (obstructive sleep apnea)    not using cpap   Paralysis (HCC)    left hand - small    due to accident with an head injury   Pneumonia    Pre-diabetes    Prostatitis    Past Surgical History:  Procedure Laterality Date   ABDOMINAL SURGERY     ANKLE FRACTURE SURGERY Right    ANTERIOR CERVICAL DECOMP/DISCECTOMY FUSION N/A 05/23/2019   Procedure: ANTERIOR CERVICAL DISCECTOMY FUSION CERVICAL FIVE THROUGH CERVICAL SIX AND CERVICAL SIX THROUGH CERVICAL SEVEN;  Surgeon: Kerrin Champagne, MD;  Location: [facility] OR;  Service: Orthopedics;  Laterality: N/A;   BRONCHIAL BRUSHINGS  04/28/2022   Procedure: BRONCHIAL BRUSHINGS;  Surgeon: Leslye Peer, MD;  Location: [facility] ENDOSCOPY;  Service: Pulmonary;;   BRONCHIAL NEEDLE ASPIRATION BIOPSY  04/28/2022   Procedure: BRONCHIAL NEEDLE ASPIRATION BIOPSIES;  Surgeon: Leslye Peer, MD;  Location: [facility] ENDOSCOPY;  Service: Pulmonary;;   COLONOSCOPY     CYSTOSCOPY     [place]   KNEE ARTHROSCOPY WITH MEDIAL MENISECTOMY Left 01/02/2017   Procedure: LEFT KNEE ARTHROSCOPY WITH PARTIAL MEDIAL MENISCECTOMY;  Surgeon:  Kathryne Hitch, MD;  Location: [facility] ORS;  Service: Orthopedics;  Laterality: Left;   POSTERIOR CERVICAL FUSION/FORAMINOTOMY N/A 08/20/2020   Procedure: LEFT CERVICAL SIX THROUGH SEVEN  AND CERVICAL SEVEN THROUGH THORACIC ONE  FORAMINOTOMIES WITH EXCISION OF DISC HERNIATION LEFT CERVICAL SEVEN THROUGH THORACIC ONE;  Surgeon: Kerrin Champagne, MD;  Location: [facility] OR;  Service: Orthopedics;  Laterality: N/A;   TONSILLECTOMY     TURBINATE RESECTION  2007   UVULOPALATOPHARYNGOPLASTY     VIDEO BRONCHOSCOPY WITH RADIAL ENDOBRONCHIAL ULTRASOUND  04/28/2022   Procedure: VIDEO BRONCHOSCOPY WITH RADIAL ENDOBRONCHIAL ULTRASOUND;  Surgeon: Leslye Peer, MD;  Location: [facility] ENDOSCOPY;  Service: Pulmonary;;   Social History   Socioeconomic History   Marital status: Single    Spouse name: Not on  file   Number of children: Not on file   Years of education: Not on file   Highest education level: Not on file  Occupational History   Occupation: Quarry manager  Tobacco Use   Smoking status: Former    Current packs/day: 0.00    Average packs/day: 0.1 packs/day for 15.0 years (1.5 ttl pk-yrs)    Types: Cigarettes    Start date: 05/26/1968    Quit date: 05/27/1983    Years since quitting: 39.6    Passive exposure: Past   Smokeless tobacco: Never   Tobacco comments:    significant second-hand exposure through mother  Vaping Use   Vaping status: Never Used  Substance and Sexual Activity   Alcohol use: Not Currently    Comment: 2 x a year   Drug use: No   Sexual activity: Never    Birth control/protection: None  Other Topics Concern   Not on file  Social History Narrative   [facility]:   Originally from [place]. Previously has lived in [place] & [place]. He has lived in [place], [place], & [place]. He has worked in Artist. No pets currently. 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 No mold, asbestos, or hot tub exposure.    Social Determinants of Health   Financial Resource Strain: Low Risk  (09/19/2022)   Received from [facility], [facility]   Overall Financial Resource Strain (CARDIA)    Difficulty of Paying Living Expenses: Not hard at all  Food Insecurity: No Food Insecurity (09/19/2022)   Received from [facility], [facility]   Hunger Vital Sign    Worried About Running Out of Food in the Last Year: Never true    Ran Out of Food in the Last Year: Never true  Transportation Needs: No Transportation Needs (09/19/2022)   Received from [facility], [facility]   PRAPARE - Transportation    Lack of Transportation (Medical): No    Lack of Transportation (Non-Medical): No  Physical Activity: Unknown (09/19/2022)   Received from [facility], [facility]   Exercise Vital Sign    Days of Exercise per Week: 0 days     Minutes of Exercise per Session: Not on file  Stress: Stress Concern Present (09/19/2022)   Received from [facility] [place] [facility] - Occupational Stress Questionnaire    Feeling of Stress : To some extent  Social Connections: Socially Isolated (09/19/2022)   Received from [facility], [facility]   Social Network    How would you rate your social network (family, work, friends)?: Little participation, lonely and socially isolated   Family History  Problem Relation Age of Onset  Dementia Mother    COPD Mother    Hypertension Mother    Diabetes Mother    Cancer Father        lymphoma, colon   Migraines Sister    Prostate cancer Maternal Uncle    Migraines Paternal Aunt    Diabetes Paternal Uncle    Diabetes Maternal Grandmother    Heart disease Maternal Grandfather    Diabetes Maternal Grandfather    Diabetes Paternal Grandmother    Diabetes Paternal Grandfather    Heart disease Paternal Grandfather    Lung disease Neg Hx    Rheumatologic disease Neg Hx    Allergies  Allergen Reactions   Bee Venom Swelling   Cymbalta [Duloxetine Hcl] Shortness Of Breath    Brought on asthma   Ppd [Tuberculin Purified Protein Derivative] Other (See Comments)    +ppd NEG Quantferron Gold 3/13 (shows false positive)    Calan [Verapamil] Other (See Comments)    Back pain   Tricor [Fenofibrate] Other (See Comments)    Back pain   Claritin [Loratadine] Other (See Comments)    Unknown reaction    Levaquin [Levofloxacin] Diarrhea   Current Outpatient Medications  Medication Sig Dispense Refill   acetaminophen (TYLENOL) 500 MG tablet Take 1,000 mg by mouth 3 (three) times daily.     albuterol (VENTOLIN HFA) 108 (90 Base) MCG/ACT inhaler Inhale 2 puffs into the lungs every 4 (four) hours as needed for wheezing or shortness of breath. 18 each 1   amoxicillin-clavulanate (AUGMENTIN) 875-125 MG tablet Take 1 tablet by mouth 2 (two) times daily. 14  tablet 0   anastrozole (ARIMIDEX) 1 MG tablet Take 1 mg by mouth daily.     ascorbic acid (VITAMIN C) 1000 MG tablet Take 1,000 mg by mouth every evening.     aspirin EC 81 MG tablet Take 81 mg by mouth daily. Swallow whole.     Azelastine HCl 137 MCG/SPRAY SOLN Place 2 sprays into the nose in the morning and at bedtime. 90 mL 1   benzonatate (TESSALON) 200 MG capsule Take 1 capsule 3 x /day to Prevent Cough                                                     /                                  TAKE                               BY                              MOUTH 90 capsule 1   botulinum toxin Type A (BOTOX) 200 units injection Provider to inject 155 units into the muscles of the head and neck every 12 weeks. Discard remainder. 1 each 1   Botulinum Toxin Type A 200 units SOLR Inject 200 Units into the skin every 3 (three) months.     budesonide (PULMICORT) 0.5 MG/2ML nebulizer solution Take 2 mLs (0.5 mg total) by nebulization 2 (two) times daily as needed. 120 mL 1   Calcium Carb-Cholecalciferol (CALCIUM + VITAMIN D3 PO)  Take 1 tablet by mouth daily.     celecoxib (CELEBREX) 200 MG capsule TAKE 1 CAPSULE (200 MG TOTAL) BY MOUTH EVERY 12 (TWELVE) HOURS. 60 capsule 2   cetaphil (CETAPHIL) lotion Apply 1 Application topically 2 (two) times daily.     cetirizine (ZYRTEC) 10 MG tablet Take 1 tablet (10 mg total) by mouth in the morning.     Cholecalciferol (VITAMIN D3) 125 MCG (5000 UT) TABS Take 5,000-10,000 Units by mouth See admin instructions. Take 10,000 in the morning and 5,000 units in the evening     Cinnamon 500 MG capsule Take 1,000 mg by mouth 3 (three) times daily.     Coenzyme Q10 (CO Q 10 PO) Take 600 mg by mouth daily.     cyclobenzaprine (FLEXERIL) 10 MG tablet TAKE 1/2 TO 1 TABLET 3 TIMES A DAY ONLY IF NEED FOR MUSCLE SPASM (NECK) 90 tablet 0   Dextromethorphan HBr (DELSYM PO) Take by mouth.     diclofenac Sodium (VOLTAREN) 1 % GEL Apply 2-4 g topically 2 (two) times daily as needed  (pain).     DM-APAP-CPM (CORICIDIN HBP PO) Take by mouth.     donepezil (ARICEPT) 23 MG TABS tablet TAKE 1 TABLET DAILY FOR MEMORY 90 tablet 3   EPINEPHrine 0.3 mg/0.3 mL IJ SOAJ injection Inject 0.3 mg into the muscle as needed for anaphylaxis.     furosemide (LASIX) 80 MG tablet Take 80 mg by mouth daily as needed for edema.     Guaifenesin 1200 MG TB12 Take 1,200 mg by mouth 3 (three) times daily.     ipratropium (ATROVENT) 0.03 % nasal spray Place 2 sprays into the nose 3 (three) times daily. 30 mL 2   ipratropium-albuterol (DUONEB) 0.5-2.5 (3) MG/3ML SOLN Take 3 mLs by nebulization 2 (two) times daily as needed. 120 mL 1   KLOR-CON M20 20 MEQ tablet TAKE 1 TABLET TWICE DAILY FOR POTASSIUM 180 tablet 3   Krill Oil 500 MG CAPS Take 500 mg by mouth 2 (two) times daily.     losartan (COZAAR) 25 MG tablet Take 1 tablet (25 mg total) by mouth daily. 90 tablet 3   Magnesium 500 MG CAPS Take 500 mg by mouth 3 (three) times daily.     memantine (NAMENDA) 10 MG tablet Take 10 mg by mouth 2 (two) times daily.     metFORMIN (GLUCOPHAGE-XR) 500 MG 24 hr tablet TAKES 2 TABLETS 2 X /DAY WITH MEALS FOR DIABETES 360 tablet 3   metoprolol tartrate (LOPRESSOR) 25 MG tablet TAKE 1 TABLET 2 X /DAY (EVERY 12 HOURS) FOR BP / TAKE BY MOUTH 180 tablet 3   Misc Natural Products (GLUCOS-CHONDROIT-MSM COMPLEX) TABS Take 2 tablets by mouth 3 (three) times daily.     montelukast (SINGULAIR) 10 MG tablet TAKE 1 TABLET BY MOUTH DAILY FOR ALLERGIES 90 tablet 1   Multiple Vitamins-Minerals (MULTIVITAMIN WITH MINERALS) tablet Take 1 tablet by mouth every morning.     Multiple Vitamins-Minerals (PRESERVISION AREDS 2) CAPS Take 1 capsule by mouth 2 (two) times daily. Evening and night     oxymetazoline (AFRIN) 0.05 % nasal spray Place 1 spray into both nostrils at bedtime.     pentosan polysulfate (ELMIRON) 100 MG capsule Take 100 mg by mouth 3 (three) times daily.     pravastatin (PRAVACHOL) 40 MG tablet TAKE 1 TABLET BY  MOUTH EVERY DAY AT BEDTIME FOR CHOLESTEROL 30 tablet 11   pregabalin (LYRICA) 150 MG capsule TAKE 1 CAPSULE 3 TIMES  A DAY FOR CHRONIC PAIN 270 capsule 0   promethazine-dextromethorphan (PROMETHAZINE-DM) 6.25-15 MG/5ML syrup TAKE 5 MLS BY MOUTH 4 TIMES A DAY AS NEEDED FOR COUGH 240 mL 1   Semaglutide, 2 MG/DOSE, (OZEMPIC, 2 MG/DOSE,) 8 MG/3ML SOPN Inject  2 mg  into Skin every 7 days for Diabetes  (E11.29) 9 mL 3   sertraline (ZOLOFT) 100 MG tablet TAKE 2 TABLETS DAILY FOR MOOD / DEPRESSION 180 tablet 3   sodium chloride (OCEAN) 0.65 % SOLN nasal spray Place 1 spray into both nostrils 4 (four) times daily as needed for congestion. Uses each time before other nasal sprays     tadalafil (CIALIS) 5 MG tablet Take 5 mg by mouth daily.      TRELEGY ELLIPTA 100-62.5-25 MCG/ACT AEPB Inhale 1 puff into the lungs daily. 60 each 0   trospium (SANCTURA) 20 MG tablet Take 20 mg by mouth 2 (two) times daily. In the evening and at bedtime     TURMERIC PO Take 2,000 mg by mouth 3 (three) times daily. 1000 mg each     zinc gluconate 50 MG tablet Take 50 mg by mouth 3 (three) times daily.     No current facility-administered medications for this visit.   No results found.  Review of Systems:   A ROS was performed including pertinent positives and negatives as documented in the HPI.  Physical Exam :   Constitutional: NAD and appears stated age Neurological: Alert and oriented Psych: Appropriate affect and cooperative There were no vitals taken for this visit.   Comprehensive Musculoskeletal Exam:    Musculoskeletal Exam  Gait Normal  Alignment Normal   Right Left  Inspection Normal Normal  Palpation    Tenderness Medial joint line Medial joint line  Crepitus None None  Effusion Mild Mild  Range of Motion    Extension 5 5  Flexion 110 110  Strength    Extension 5/5 5/5  Flexion 5/5 5/5  Ligament Exam     Generalized Laxity No No  Lachman Negative Negative   Anterior Drawer Negative Negative   Valgus at 0 Negative Negative  Valgus at 20 Negative Negative  Varus at 0 0 0  Varus at 20   0 0  Vascular/Lymphatic Exam    Edema None None  Venous Stasis Changes No Slight discoloration of lower leg into ankle  Distal Circulation Normal Normal  Neurologic    Light Touch Sensation Intact Intact  Special Tests:      Imaging:   Xray (right knee 4 views): Moderate tricompartmental osteoarthritis with joint space narrowing most notable in the medial compartment.    I personally reviewed and interpreted the radiographs.   Assessment:   62 y.o. male with bilateral osteoarthritis.  He did get good relief with viscosupplementation, however still waiting on approval for his second round.  Current symptoms and pain are very debilitating, as he is having a hard time even getting up to use the restroom.  Patient would like to repeat cortisone injections today, which I do feel at this point is very reasonable.  Injections were performed of both knees using ultrasound guidance and patient tolerated this well.  He is having some instability and would like some knee braces today if possible.  Provided him with bilateral reaction web braces which he did tolerate well.  Will continue following with Dr. Magnus Ivan for treatment.  Encouraged continued weight loss as he would be a good candidate for knee replacement.  Also is showing early  signs of venous insufficiency the lower right leg, so I did recommend trying compression socks for this.  Plan :    -Bilateral cortisone injections performed today under ultrasound guidance after patient consent -Patient placed in bilateral knee osteoarthritis braces as requested -Continue following with Dr. Magnus Ivan for further treatment      Procedure Note  Patient: Mark Gelwicks.             Date of Birth: 09-01-1960           MRN: [id number]             Visit Date: 01/21/2023  Procedures: Visit Diagnoses:  1. Bilateral primary osteoarthritis of knee      Large Joint Inj: bilateral knee on 01/21/2023 5:00 PM Indications: pain Details: 22 G 1.5 in needle, ultrasound-guided anterolateral approach Medications (Right): 4 mL lidocaine 1 %; 2 mL triamcinolone acetonide 40 MG/ML Medications (Left): 4 mL lidocaine 1 %; 2 mL triamcinolone acetonide 40 MG/ML Outcome: tolerated well, no immediate complications Procedure, treatment alternatives, risks and benefits explained, specific risks discussed. Consent was given by the patient. Immediately prior to procedure a time out was called to verify the correct patient, procedure, equipment, support staff and site/side marked as required. Patient was prepped and draped in the usual sterile fashion.      I personally saw and evaluated the patient, and participated in the management and treatment plan.  Hazle Nordmann, PA-C Orthopedics

## 2023-01-27 ENCOUNTER — Ambulatory Visit: Payer: BC Managed Care – PPO | Admitting: Podiatry

## 2023-01-27 ENCOUNTER — Encounter: Payer: Self-pay | Admitting: Podiatry

## 2023-01-27 DIAGNOSIS — D2372 Other benign neoplasm of skin of left lower limb, including hip: Secondary | ICD-10-CM

## 2023-01-27 DIAGNOSIS — M722 Plantar fascial fibromatosis: Secondary | ICD-10-CM

## 2023-01-27 DIAGNOSIS — B351 Tinea unguium: Secondary | ICD-10-CM

## 2023-01-27 DIAGNOSIS — M7751 Other enthesopathy of right foot: Secondary | ICD-10-CM | POA: Diagnosis not present

## 2023-01-27 DIAGNOSIS — M7752 Other enthesopathy of left foot: Secondary | ICD-10-CM

## 2023-01-27 DIAGNOSIS — D2371 Other benign neoplasm of skin of right lower limb, including hip: Secondary | ICD-10-CM

## 2023-01-27 DIAGNOSIS — M79676 Pain in unspecified toe(s): Secondary | ICD-10-CM | POA: Diagnosis not present

## 2023-01-27 MED ORDER — DEXAMETHASONE SODIUM PHOSPHATE 120 MG/30ML IJ SOLN
4.0000 mg | Freq: Once | INTRAMUSCULAR | Status: AC
Start: 2023-01-27 — End: 2023-01-27
  Administered 2023-01-27: 4 mg via INTRA_ARTICULAR

## 2023-01-27 NOTE — Progress Notes (Signed)
He presents today chief complaint of painful elongated toenails and painful calluses.  He is also complaining of painful area beneath the fifth metatarsals bilateral.  Objective: Vital signs are stable he is alert oriented x 3 there is no erythema edema salines drainage or odor though he does have fluctuance on palpation of the plantar aspect of the foot beneath the fifth metatarsal heads.  This is exquisitely tender on palpation.  No open lesions or wounds are noted.  Multiple calluses plantar aspect of the foot and around the toes.  No signs of tinea pedis.  Toenails are long thick yellow dystrophic clinically mycotic.  Assessment: Pain in limb secondary to onychomycosis benign skin lesions and bursitis subfifth let and right.  Plan: Injected this area today with dexamethasone local anesthetic debrided all benign skin lesion debrided toenails 1 through 5 bilateral.

## 2023-01-28 ENCOUNTER — Encounter: Payer: Self-pay | Admitting: Nurse Practitioner

## 2023-01-28 ENCOUNTER — Ambulatory Visit (INDEPENDENT_AMBULATORY_CARE_PROVIDER_SITE_OTHER): Payer: BC Managed Care – PPO | Admitting: Nurse Practitioner

## 2023-01-28 VITALS — BP 130/70 | HR 75 | Temp 97.5°F | Ht 72.0 in | Wt 341.6 lb

## 2023-01-28 DIAGNOSIS — I10 Essential (primary) hypertension: Secondary | ICD-10-CM | POA: Diagnosis not present

## 2023-01-28 DIAGNOSIS — E1122 Type 2 diabetes mellitus with diabetic chronic kidney disease: Secondary | ICD-10-CM | POA: Diagnosis not present

## 2023-01-28 DIAGNOSIS — Z79899 Other long term (current) drug therapy: Secondary | ICD-10-CM

## 2023-01-28 DIAGNOSIS — K219 Gastro-esophageal reflux disease without esophagitis: Secondary | ICD-10-CM | POA: Diagnosis not present

## 2023-01-28 DIAGNOSIS — G4733 Obstructive sleep apnea (adult) (pediatric): Secondary | ICD-10-CM | POA: Diagnosis not present

## 2023-01-28 DIAGNOSIS — J4489 Other specified chronic obstructive pulmonary disease: Secondary | ICD-10-CM | POA: Diagnosis not present

## 2023-01-28 DIAGNOSIS — M542 Cervicalgia: Secondary | ICD-10-CM

## 2023-01-28 DIAGNOSIS — E559 Vitamin D deficiency, unspecified: Secondary | ICD-10-CM

## 2023-01-28 DIAGNOSIS — F3341 Major depressive disorder, recurrent, in partial remission: Secondary | ICD-10-CM

## 2023-01-28 DIAGNOSIS — F0150 Vascular dementia without behavioral disturbance: Secondary | ICD-10-CM

## 2023-01-28 DIAGNOSIS — J449 Chronic obstructive pulmonary disease, unspecified: Secondary | ICD-10-CM

## 2023-01-28 DIAGNOSIS — E88819 Insulin resistance, unspecified: Secondary | ICD-10-CM

## 2023-01-28 DIAGNOSIS — M5416 Radiculopathy, lumbar region: Secondary | ICD-10-CM

## 2023-01-28 DIAGNOSIS — E782 Mixed hyperlipidemia: Secondary | ICD-10-CM | POA: Diagnosis not present

## 2023-01-28 DIAGNOSIS — G43009 Migraine without aura, not intractable, without status migrainosus: Secondary | ICD-10-CM

## 2023-01-28 DIAGNOSIS — N181 Chronic kidney disease, stage 1: Secondary | ICD-10-CM

## 2023-01-28 MED ORDER — MEMANTINE HCL 10 MG PO TABS
10.0000 mg | ORAL_TABLET | Freq: Two times a day (BID) | ORAL | 3 refills | Status: DC
Start: 2023-01-28 — End: 2023-04-29

## 2023-01-28 NOTE — Patient Instructions (Signed)
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
## 2023-01-29 LAB — CBC WITH DIFFERENTIAL/PLATELET
Absolute Monocytes: 1659 {cells}/uL — ABNORMAL HIGH (ref 200–950)
Basophils Absolute: 86 {cells}/uL (ref 0–200)
Basophils Relative: 0.6 %
Eosinophils Absolute: 186 {cells}/uL (ref 15–500)
Eosinophils Relative: 1.3 %
HCT: 47.9 % (ref 38.5–50.0)
Hemoglobin: 15.5 g/dL (ref 13.2–17.1)
Lymphs Abs: 3332 {cells}/uL (ref 850–3900)
MCH: 28.5 pg (ref 27.0–33.0)
MCHC: 32.4 g/dL (ref 32.0–36.0)
MCV: 88.2 fL (ref 80.0–100.0)
MPV: 10.6 fL (ref 7.5–12.5)
Monocytes Relative: 11.6 %
Neutro Abs: 9038 {cells}/uL — ABNORMAL HIGH (ref 1500–7800)
Neutrophils Relative %: 63.2 %
Platelets: 281 10*3/uL (ref 140–400)
RBC: 5.43 10*6/uL (ref 4.20–5.80)
RDW: 13 % (ref 11.0–15.0)
Total Lymphocyte: 23.3 %
WBC: 14.3 10*3/uL — ABNORMAL HIGH (ref 3.8–10.8)

## 2023-01-29 LAB — COMPLETE METABOLIC PANEL WITH GFR
AG Ratio: 2 (calc) (ref 1.0–2.5)
ALT: 24 U/L (ref 9–46)
AST: 14 U/L (ref 10–35)
Albumin: 3.9 g/dL (ref 3.6–5.1)
Alkaline phosphatase (APISO): 62 U/L (ref 35–144)
BUN: 21 mg/dL (ref 7–25)
CO2: 29 mmol/L (ref 20–32)
Calcium: 9 mg/dL (ref 8.6–10.3)
Chloride: 103 mmol/L (ref 98–110)
Creat: 0.8 mg/dL (ref 0.70–1.35)
Globulin: 2 g/dL (ref 1.9–3.7)
Glucose, Bld: 93 mg/dL (ref 65–99)
Potassium: 4.6 mmol/L (ref 3.5–5.3)
Sodium: 142 mmol/L (ref 135–146)
Total Bilirubin: 1.1 mg/dL (ref 0.2–1.2)
Total Protein: 5.9 g/dL — ABNORMAL LOW (ref 6.1–8.1)
eGFR: 100 mL/min/{1.73_m2} (ref >=60)

## 2023-01-29 LAB — HEMOGLOBIN A1C
Hgb A1c MFr Bld: 5.6 %{Hb} (ref <5.7)
Mean Plasma Glucose: 114 mg/dL
eAG (mmol/L): 6.3 mmol/L

## 2023-01-29 LAB — LIPID PANEL
Cholesterol: 155 mg/dL (ref <200)
HDL: 42 mg/dL (ref >=40)
LDL Cholesterol (Calc): 86 mg/dL
Non-HDL Cholesterol (Calc): 113 mg/dL (ref <130)
Total CHOL/HDL Ratio: 3.7 (calc) (ref <5.0)
Triglycerides: 176 mg/dL — ABNORMAL HIGH (ref <150)

## 2023-02-11 ENCOUNTER — Other Ambulatory Visit: Payer: Self-pay | Admitting: Allergy & Immunology

## 2023-02-13 ENCOUNTER — Ambulatory Visit
Admission: RE | Admit: 2023-02-13 | Discharge: 2023-02-13 | Disposition: A | Discharge status: Home / Self Care | Payer: BC Managed Care – PPO | Source: Ambulatory Visit | Attending: Emergency Medicine | Admitting: Emergency Medicine

## 2023-02-13 DIAGNOSIS — R918 Other nonspecific abnormal finding of lung field: Secondary | ICD-10-CM | POA: Diagnosis not present

## 2023-02-13 DIAGNOSIS — J4489 Other specified chronic obstructive pulmonary disease: Secondary | ICD-10-CM

## 2023-02-19 ENCOUNTER — Encounter: Payer: Self-pay | Admitting: Orthopaedic Surgery

## 2023-02-19 ENCOUNTER — Encounter: Payer: Self-pay | Admitting: Emergency Medicine

## 2023-02-19 ENCOUNTER — Ambulatory Visit: Payer: BC Managed Care – PPO | Admitting: Emergency Medicine

## 2023-02-19 VITALS — BP 110/88 | HR 99 | Ht 72.0 in | Wt 342.8 lb

## 2023-02-19 DIAGNOSIS — J449 Chronic obstructive pulmonary disease, unspecified: Secondary | ICD-10-CM

## 2023-02-19 DIAGNOSIS — R9389 Abnormal findings on diagnostic imaging of other specified body structures: Secondary | ICD-10-CM | POA: Diagnosis not present

## 2023-02-19 DIAGNOSIS — G4733 Obstructive sleep apnea (adult) (pediatric): Secondary | ICD-10-CM

## 2023-02-19 NOTE — Progress Notes (Signed)
Subjective:    Patient ID: Mark Reasoner., male    DOB: 1960/11/07, 62 y.o.   MRN: [id number]  HPI  ROV 05/09/22 --Mark Harrell is 62, has a history of fixed asthma/COPD, minimal tobacco history, OSA, DVT, COVID-pneumonia in early 2023.  I have seen him for abnormal CT scan of the chest that showed bilateral rounded lower lobe opacities, question some mild associated hypermetabolism on PET scan 03/20/2022.  He went for bronchoscopy on 04/28/2022, somewhat technically difficult due to his body habitus and the location of the nodules.  The procedure was done in the left lateral decubitus position.  His cytologies from transbronchial biopsies were all negative.  AFB and fungal smears are negative, cultures all pending.  ROV 08/19/22 --62 year old man with a history of COPD/mixed asthma and minimal tobacco history.  Past medical history also significant for OSA, DVT, COVID-pneumonia in early 2023.  I have followed him for bilateral rounded lower lobe opacities with some borderline hypermetabolism on PET (02/2022).  He underwent bronchoscopy on 04/28/2022 that was technically challenging due to his body habitus and nodule location.  Cytologies were all negative, cultures negative as well.  Repeat CT 08/04/2022 as below He is having a lot of fatigue, no energy to do his daily activities. Not on CPAP. He is on Trelegy, occasional SABA use.   CT scan of the chest 08/04/2022 reviewed by me, shows overall stable findings.  There is no mediastinal or hilar adenopathy, clustered nodularity in the right lower lobe is unchanged to smaller, 4.9 x 0.6 cm.  The dominant right lower lobe paraspinal nodule is also smaller 2.7 x 1.4 cm.  There is subsegmental left basilar atelectasis that is unchanged.  ROV 02/19/2023 --62 year old man with a history of COPD/asthma with a minimal tobacco history, OSA, DVT.  I have followed him for this as well as an abnormal CT scan of the chest with bilateral rounded lower lobe opacities.   Bronchoscopy 04/28/2022 showed negative cytology..  His opacities were smaller on his most recent CT and we repeated as below  CT chest 02/13/2023 reviewed by me, shows stable bilateral lower lobe nodular foci including clustered nodularity in the medial right lower lobe, right lower lobe paraspinal nodule, subsegmental left basilar atelectasis unchanged.   Review of Systems As per HPI  Past Medical History:  Diagnosis Date   Anesthesia complication requiring reversal agent administration    ? from central apnea, very difficult to get off vent   Anxiety    Arthritis    osteo   Asthma    BPH (benign prostatic hyperplasia)    Complication of anesthesia    difficulty waking , they twlight me because of my respiratory problems "   COPD (chronic obstructive pulmonary disease) (HCC)    COVID    moderate case 2023   Depression    DVT (deep venous thrombosis) (HCC) 2023   right leg   Dyspnea    on exertion   Enlarged heart    Family history of adverse reaction to anesthesia    mother trouble waking up, and heart stopped   GERD (gastroesophageal reflux disease)    Headache    botox injections for headaches   Hyperlipidemia    Hypertension    Hypogonadism male    IBS (irritable bowel syndrome)    Memory difficulties    short term memories   Neuropathy    Obesity    On home oxygen therapy    on 2 liter   OSA (  obstructive sleep apnea)    not using cpap   Paralysis (HCC)    left hand - small    due to accident with an head injury   Pneumonia    Pre-diabetes    Prostatitis      Family History  Problem Relation Age of Onset   Dementia Mother    COPD Mother    Hypertension Mother    Diabetes Mother    Cancer Father        lymphoma, colon   Migraines Sister    Prostate cancer Maternal Uncle    Migraines Paternal Aunt    Diabetes Paternal Uncle    Diabetes Maternal Grandmother    Heart disease Maternal Grandfather    Diabetes Maternal Grandfather    Diabetes Paternal  Grandmother    Diabetes Paternal Grandfather    Heart disease Paternal Grandfather    Lung disease Neg Hx    Rheumatologic disease Neg Hx      Social History   Socioeconomic History   Marital status: Single    Spouse name: Not on file   Number of children: Not on file   Years of education: Not on file   Highest education level: Not on file  Occupational History   Occupation: Quarry manager  Tobacco Use   Smoking status: Former    Current packs/day: 0.00    Average packs/day: 0.1 packs/day for 15.0 years (1.5 ttl pk-yrs)    Types: Cigarettes    Start date: 05/26/1968    Quit date: 05/27/1983    Years since quitting: 39.7    Passive exposure: Past   Smokeless tobacco: Never   Tobacco comments:    significant second-hand exposure through mother  Vaping Use   Vaping status: Never Used  Substance and Sexual Activity   Alcohol use: Not Currently    Comment: 2 x a year   Drug use: No   Sexual activity: Never    Birth control/protection: None  Other Topics Concern   Not on file  Social History Narrative   [name]:   Originally from [place]. Previously has lived in [place] & [place]. He has lived in [place], [place], & [place]. He has worked in [employer]. No pets currently. 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 No mold, asbestos, or hot tub exposure.    Social Determinants of Health   Financial Resource Strain: Low Risk  (09/19/2022)   Received from [facility], [facility]   Overall Financial Resource Strain (CARDIA)    Difficulty of Paying Living Expenses: Not hard at all  Food Insecurity: No Food Insecurity (09/19/2022)   Received from [facility], [facility]   Hunger Vital Sign    Worried About Running Out of Food in the Last Year: Never true    Ran Out of Food in the Last Year: Never true  Transportation Needs: No Transportation Needs (09/19/2022)   Received from [facility], [facility]   PRAPARE - Transportation     Lack of Transportation (Medical): No    Lack of Transportation (Non-Medical): No  Physical Activity: Unknown (09/19/2022)   Received from [facility], [facility]   Exercise Vital Sign    Days of Exercise per Week: 0 days    Minutes of Exercise per Session: Not on file  Stress: Stress Concern Present (09/19/2022)   Received from [facility], [facility] - Occupational Stress Questionnaire    Feeling of Stress :  To some extent  Social Connections: Socially Isolated (09/19/2022)   Received from [facility], [facility]   Social Network    How would you rate your social network (family, work, friends)?: Little participation, lonely and socially isolated  Intimate Partner Violence: Not At Risk (09/19/2022)   Received from [facility], [facility]   HITS    Over the last 12 months how often did your partner physically hurt you?: 1    Over the last 12 months how often did your partner insult you or talk down to you?: 1    Over the last 12 months how often did your partner threaten you with physical harm?: 1    Over the last 12 months how often did your partner scream or curse at you?: 1     Allergies  Allergen Reactions   Bee Venom Swelling   Cymbalta [Duloxetine Hcl] Shortness Of Breath    Brought on asthma   Ppd [Tuberculin Purified Protein Derivative] Other (See Comments)    +ppd NEG Quantferron Gold 3/13 (shows false positive)    Calan [Verapamil] Other (See Comments)    Back pain   Tricor [Fenofibrate] Other (See Comments)    Back pain   Claritin [Loratadine] Other (See Comments)    Unknown reaction    Levaquin [Levofloxacin] Diarrhea     Outpatient Medications Prior to Visit  Medication Sig Dispense Refill   acetaminophen (TYLENOL) 500 MG tablet Take 1,000 mg by mouth 3 (three) times daily.     albuterol (VENTOLIN HFA) 108 (90 Base) MCG/ACT inhaler Inhale 2 puffs into the lungs every 4 (four) hours as needed for  wheezing or shortness of breath. 18 each 1   amoxicillin-clavulanate (AUGMENTIN) 875-125 MG tablet Take 1 tablet by mouth 2 (two) times daily. 14 tablet 0   anastrozole (ARIMIDEX) 1 MG tablet Take 1 mg by mouth daily.     ascorbic acid (VITAMIN C) 1000 MG tablet Take 1,000 mg by mouth every evening.     aspirin EC 81 MG tablet Take 81 mg by mouth daily. Swallow whole.     Azelastine HCl 137 MCG/SPRAY SOLN Place 2 sprays into the nose in the morning and at bedtime. 90 mL 1   benzonatate (TESSALON) 200 MG capsule Take 1 capsule 3 x /day to Prevent Cough                                                     /                                  TAKE                               BY                              MOUTH 90 capsule 1   botulinum toxin Type A (BOTOX) 200 units injection Provider to inject 155 units into the muscles of the head and neck every 12 weeks. Discard remainder. 1 each 1   budesonide (PULMICORT) 0.5 MG/2ML nebulizer solution INHALE CONTENTS OF 1 VIAL VIA NEBULIZER 2 (TWO) TIMES DAILY AS NEEDED.  360 mL 1   Calcium Carb-Cholecalciferol (CALCIUM + VITAMIN D3 PO) Take 1 tablet by mouth daily.     celecoxib (CELEBREX) 200 MG capsule TAKE 1 CAPSULE (200 MG TOTAL) BY MOUTH EVERY 12 (TWELVE) HOURS. 60 capsule 2   cetaphil (CETAPHIL) lotion Apply 1 Application topically 2 (two) times daily.     cetirizine (ZYRTEC) 10 MG tablet Take 1 tablet (10 mg total) by mouth in the morning.     Cholecalciferol (VITAMIN D3) 125 MCG (5000 UT) TABS Take 5,000-10,000 Units by mouth See admin instructions. Take 10,000 in the morning and 5,000 units in the evening     Cinnamon 500 MG capsule Take 1,000 mg by mouth 3 (three) times daily.     Coenzyme Q10 (CO Q 10 PO) Take 600 mg by mouth daily.     cyclobenzaprine (FLEXERIL) 10 MG tablet TAKE 1/2 TO 1 TABLET 3 TIMES A DAY ONLY IF NEED FOR MUSCLE SPASM (NECK) 90 tablet 0   Dextromethorphan HBr (DELSYM PO) Take by mouth.     diclofenac Sodium (VOLTAREN) 1 % GEL Apply  2-4 g topically 2 (two) times daily as needed (pain).     DM-APAP-CPM (CORICIDIN HBP PO) Take by mouth.     donepezil (ARICEPT) 23 MG TABS tablet TAKE 1 TABLET DAILY FOR MEMORY 90 tablet 3   EPINEPHrine 0.3 mg/0.3 mL IJ SOAJ injection Inject 0.3 mg into the muscle as needed for anaphylaxis.     furosemide (LASIX) 80 MG tablet Take 80 mg by mouth daily as needed for edema.     Guaifenesin 1200 MG TB12 Take 1,200 mg by mouth 3 (three) times daily.     ipratropium (ATROVENT) 0.03 % nasal spray Place 2 sprays into the nose 3 (three) times daily. 30 mL 2   ipratropium-albuterol (DUONEB) 0.5-2.5 (3) MG/3ML SOLN Take 3 mLs by nebulization 2 (two) times daily as needed. 120 mL 1   KLOR-CON M20 20 MEQ tablet TAKE 1 TABLET TWICE DAILY FOR POTASSIUM 180 tablet 3   Krill Oil 500 MG CAPS Take 500 mg by mouth 2 (two) times daily.     losartan (COZAAR) 25 MG tablet Take 1 tablet (25 mg total) by mouth daily. 90 tablet 3   Magnesium 500 MG CAPS Take 500 mg by mouth 3 (three) times daily.     memantine (NAMENDA) 10 MG tablet Take 1 tablet (10 mg total) by mouth 2 (two) times daily. 270 tablet 3   metFORMIN (GLUCOPHAGE-XR) 500 MG 24 hr tablet TAKES 2 TABLETS 2 X /DAY WITH MEALS FOR DIABETES 360 tablet 3   metoprolol tartrate (LOPRESSOR) 25 MG tablet TAKE 1 TABLET 2 X /DAY (EVERY 12 HOURS) FOR BP / TAKE BY MOUTH 180 tablet 3   Misc Natural Products (GLUCOS-CHONDROIT-MSM COMPLEX) TABS Take 2 tablets by mouth 3 (three) times daily.     montelukast (SINGULAIR) 10 MG tablet TAKE 1 TABLET BY MOUTH DAILY FOR ALLERGIES 90 tablet 1   Multiple Vitamins-Minerals (MULTIVITAMIN WITH MINERALS) tablet Take 1 tablet by mouth every morning.     Multiple Vitamins-Minerals (PRESERVISION AREDS 2) CAPS Take 1 capsule by mouth 2 (two) times daily. Evening and night     oxymetazoline (AFRIN) 0.05 % nasal spray Place 1 spray into both nostrils at bedtime.     pentosan polysulfate (ELMIRON) 100 MG capsule Take 100 mg by mouth 3 (three)  times daily.     pravastatin (PRAVACHOL) 40 MG tablet TAKE 1 TABLET BY MOUTH EVERY DAY AT BEDTIME FOR  CHOLESTEROL 30 tablet 11   pregabalin (LYRICA) 150 MG capsule TAKE 1 CAPSULE 3 TIMES A DAY FOR CHRONIC PAIN 270 capsule 0   promethazine-dextromethorphan (PROMETHAZINE-DM) 6.25-15 MG/5ML syrup TAKE 5 MLS BY MOUTH 4 TIMES A DAY AS NEEDED FOR COUGH 240 mL 1   Semaglutide, 2 MG/DOSE, (OZEMPIC, 2 MG/DOSE,) 8 MG/3ML SOPN Inject  2 mg  into Skin every 7 days for Diabetes  (E11.29) 9 mL 3   sertraline (ZOLOFT) 100 MG tablet TAKE 2 TABLETS DAILY FOR MOOD / DEPRESSION 180 tablet 3   sodium chloride (OCEAN) 0.65 % SOLN nasal spray Place 1 spray into both nostrils 4 (four) times daily as needed for congestion. Uses each time before other nasal sprays     tadalafil (CIALIS) 5 MG tablet Take 5 mg by mouth daily.      TRELEGY ELLIPTA 100-62.5-25 MCG/ACT AEPB Inhale 1 puff into the lungs daily. 60 each 0   trospium (SANCTURA) 20 MG tablet Take 20 mg by mouth 2 (two) times daily. In the evening and at bedtime     TURMERIC PO Take 2,000 mg by mouth 3 (three) times daily. 1000 mg each     zinc gluconate 50 MG tablet Take 50 mg by mouth 3 (three) times daily.     No facility-administered medications prior to visit.        Objective:   Physical Exam  Vitals:   02/19/23 1538  BP: 110/88  Pulse: 99  SpO2: 92%  Weight: (!) 342 lb 12.8 oz (155.5 kg)  Height: 6' (1.829 m)   Gen: Pleasant, obese, in no distress,  normal affect  ENT: No lesions,  mouth clear,  oropharynx clear, no postnasal drip  Neck: No JVD, no stridor  Lungs: No use of accessory muscles, no crackles or wheezing on normal respiration, no wheeze on forced expiration  Cardiovascular: RRR, heart sounds normal, no murmur or gallops, trace peripheral edema  Musculoskeletal: No deformities, no cyanosis or clubbing  Neuro: alert, awake, non focal  Skin: Warm, no lesions or rash      Assessment & Plan:   OSA and COPD overlap syndrome  (HCC) He does have exertional hypoxemia, multifactorial due to obstructive and restrictive disease.  He is on Trelegy, plan to continue.  Unfortunately he cannot tolerate CPAP.  He is sleeping with oxygen in place  Abnormal CT of the chest Bilateral basilar nodular infiltrates that decreased in size on previous scan, now are stable in size.  He had negative biopsies done in December 2023.  We will plan to continue serial follow-up, ensure at least 2 years of stability.  Next scan to be done in 6 months, March 2025.    Levy Pupa, MD, PhD 02/19/2023, 4:06 PM [facility] [phone] or if no answer before 7:00PM call [phone] For any issues after 7:00PM please call eLink [phone]

## 2023-02-19 NOTE — Patient Instructions (Addendum)
We reviewed your CT scan of the chest today.  This is stable compared with your prior imaging.  Good news. We will plan to repeat your CT chest in March 2025. Please continue your Trelegy 1 elation once daily.  Rinse and gargle after using. Wear your oxygen reliably with exertion Please follow Dr. Delton Coombes in March 2025 after your CT so we can review those results together

## 2023-02-19 NOTE — Assessment & Plan Note (Signed)
Bilateral basilar nodular infiltrates that decreased in size on previous scan, now are stable in size.  He had negative biopsies done in December 2023.  We will plan to continue serial follow-up, ensure at least 2 years of stability.  Next scan to be done in 6 months, March 2025.

## 2023-02-19 NOTE — Assessment & Plan Note (Signed)
He does have exertional hypoxemia, multifactorial due to obstructive and restrictive disease.  He is on Trelegy, plan to continue.  Unfortunately he cannot tolerate CPAP.  He is sleeping with oxygen in place

## 2023-02-20 ENCOUNTER — Other Ambulatory Visit: Payer: Self-pay

## 2023-02-20 DIAGNOSIS — M170 Bilateral primary osteoarthritis of knee: Secondary | ICD-10-CM

## 2023-02-20 NOTE — Telephone Encounter (Signed)
Talked with patient and appointments have been scheduled for gel injection. ? ?

## 2023-02-24 ENCOUNTER — Encounter: Payer: BC Managed Care – PPO | Admitting: Internal Medicine

## 2023-02-26 ENCOUNTER — Ambulatory Visit: Payer: BC Managed Care – PPO | Admitting: Physician Assistant

## 2023-02-26 ENCOUNTER — Encounter: Payer: Self-pay | Admitting: Physician Assistant

## 2023-02-26 DIAGNOSIS — M170 Bilateral primary osteoarthritis of knee: Secondary | ICD-10-CM

## 2023-02-26 DIAGNOSIS — M1712 Unilateral primary osteoarthritis, left knee: Secondary | ICD-10-CM

## 2023-02-26 DIAGNOSIS — M1711 Unilateral primary osteoarthritis, right knee: Secondary | ICD-10-CM

## 2023-02-26 MED ORDER — HYALURONAN 30 MG/2ML IX SOSY
30.0000 mg | PREFILLED_SYRINGE | INTRA_ARTICULAR | Status: AC | PRN
Start: 2023-02-26 — End: 2023-02-26
  Administered 2023-02-26: 30 mg via INTRA_ARTICULAR

## 2023-02-26 MED ORDER — LIDOCAINE HCL 1 % IJ SOLN
3.0000 mL | INTRAMUSCULAR | Status: AC | PRN
Start: 2023-02-26 — End: 2023-02-26
  Administered 2023-02-26: 3 mL

## 2023-02-26 NOTE — Progress Notes (Signed)
Procedure Note  Patient: Mark Harrell.             Date of Birth: 09-Mar-1961           MRN: [id number]             Visit Date: 02/26/2023 HPI: Mr. Alysia Penna comes in today for scheduled Orthovisc injections both knees.  He has failed conservative treatment which included cortisone injections.  Due to his weight he is not a surgical candidate for total knee replacement.  He has known osteoarthritis of both knees.  Bilateral knees: No abnormal warmth erythema.  Right knee positive effusion.  Left knee no effusion.  Procedures: Visit Diagnoses:  1. Unilateral primary osteoarthritis, left knee   2. Unilateral primary osteoarthritis, right knee     Large Joint Inj: bilateral knee on 02/26/2023 4:10 PM Indications: pain Details: 22 G 1.5 in needle, superolateral approach  Arthrogram: No  Medications (Right): 3 mL lidocaine 1 %; 30 mg Hyaluronan 30 MG/2ML Aspirate (Right): 15 mL yellow Medications (Left): 30 mg Hyaluronan 30 MG/2ML Outcome: tolerated well, no immediate complications Procedure, treatment alternatives, risks and benefits explained, specific risks discussed. Consent was given by the patient. Immediately prior to procedure a time out was called to verify the correct patient, procedure, equipment, support staff and site/side marked as required. Patient was prepped and draped in the usual sterile fashion.     Plan: He will remove the Ace bandage from his right knee prior to returning to bed this evening.  He will follow-up with [place] in 1 week for second set of Orthovisc injections.  Questions were encouraged and answered at length.

## 2023-03-01 ENCOUNTER — Other Ambulatory Visit: Payer: Self-pay | Admitting: Nurse Practitioner

## 2023-03-01 ENCOUNTER — Other Ambulatory Visit: Payer: Self-pay | Admitting: Allergy & Immunology

## 2023-03-01 DIAGNOSIS — S161XXD Strain of muscle, fascia and tendon at neck level, subsequent encounter: Secondary | ICD-10-CM

## 2023-03-03 ENCOUNTER — Telehealth: Payer: Self-pay | Admitting: Nurse Practitioner

## 2023-03-03 ENCOUNTER — Other Ambulatory Visit: Payer: Self-pay | Admitting: Nurse Practitioner

## 2023-03-03 MED ORDER — PREGABALIN 150 MG PO CAPS: ORAL_CAPSULE | ORAL | 0 refills | Status: DC

## 2023-03-03 NOTE — Telephone Encounter (Signed)
Requesting refill on Pregablin. Pls send to [id number] - [place], [place] - [address]

## 2023-03-03 NOTE — Telephone Encounter (Signed)
In your last note it stated he was getting through pain management.  Can you address this?

## 2023-03-04 ENCOUNTER — Ambulatory Visit: Payer: BC Managed Care – PPO | Admitting: Nurse Practitioner

## 2023-03-04 ENCOUNTER — Encounter: Payer: Self-pay | Admitting: Nurse Practitioner

## 2023-03-04 VITALS — BP 150/100 | HR 80 | Temp 97.6°F | Ht 72.0 in | Wt 335.0 lb

## 2023-03-04 DIAGNOSIS — R1030 Lower abdominal pain, unspecified: Secondary | ICD-10-CM

## 2023-03-04 DIAGNOSIS — N3941 Urge incontinence: Secondary | ICD-10-CM

## 2023-03-04 DIAGNOSIS — R8290 Unspecified abnormal findings in urine: Secondary | ICD-10-CM | POA: Diagnosis not present

## 2023-03-04 DIAGNOSIS — Z23 Encounter for immunization: Secondary | ICD-10-CM

## 2023-03-04 DIAGNOSIS — I10 Essential (primary) hypertension: Secondary | ICD-10-CM

## 2023-03-04 LAB — CBC WITH DIFFERENTIAL/PLATELET
Absolute Monocytes: 1120 {cells}/uL — ABNORMAL HIGH (ref 200–950)
Basophils Absolute: 80 {cells}/uL (ref 0–200)
Basophils Relative: 0.8 %
Eosinophils Absolute: 140 {cells}/uL (ref 15–500)
Eosinophils Relative: 1.4 %
HCT: 50.4 % — ABNORMAL HIGH (ref 38.5–50.0)
Hemoglobin: 16.4 g/dL (ref 13.2–17.1)
Lymphs Abs: 2280 {cells}/uL (ref 850–3900)
MCH: 28.8 pg (ref 27.0–33.0)
MCHC: 32.5 g/dL (ref 32.0–36.0)
MCV: 88.4 fL (ref 80.0–100.0)
MPV: 10.7 fL (ref 7.5–12.5)
Monocytes Relative: 11.2 %
Neutro Abs: 6380 {cells}/uL (ref 1500–7800)
Neutrophils Relative %: 63.8 %
Platelets: 282 10*3/uL (ref 140–400)
RBC: 5.7 10*6/uL (ref 4.20–5.80)
RDW: 13.2 % (ref 11.0–15.0)
Total Lymphocyte: 22.8 %
WBC: 10 10*3/uL (ref 3.8–10.8)

## 2023-03-04 MED ORDER — CIPROFLOXACIN HCL 250 MG PO TABS
ORAL_TABLET | ORAL | 0 refills | Status: DC
Start: 2023-03-04 — End: 2023-03-26

## 2023-03-04 NOTE — Progress Notes (Signed)
Assessment and Plan:  Mark Harrell. was seen today for an episodic visit.  Diagnoses and all order for this visit:  Malodorous urine Stay well hydrated to keep urinary system well flushed Consider daily cranberry juice or oral supplement to help any bacteria from adhering to bladder wall causing increase for infection. Monitor for any increase in fever, chills, N/V, abdominal pain, hematuria.   Contact office or report to ER for further evaluation if s/s fail to improve or any sign of worsening infection as noted above.  - Urinalysis, Routine w reflex microscopic - Urine Culture - CBC with Differential/Platelet - ciprofloxacin (CIPRO) 250 MG tablet; Take 1 tablet 2 x /day with Food for Infection  Dispense: 14 tablet; Refill: 0  Lower abdominal pain Discussed possible prostatitis  - ciprofloxacin (CIPRO) 250 MG tablet; Take 1 tablet 2 x /day with Food for Infection  Dispense: 14 tablet; Refill: 0  Urgency incontinence  - Urinalysis, Routine w reflex microscopic - Urine Culture - CBC with Differential/Platelet - ciprofloxacin (CIPRO) 250 MG tablet; Take 1 tablet 2 x /day with Food for Infection  Dispense: 14 tablet; Refill: 0  Essential hypertension Elevated - Asymptomatic Increase Losartan to 50 mg daily. Continue Metoprolol BID Monitor BP at home-Call if greater than 130/80.   Need for immunization against influenza Administered - patient tolerated well.  - Fluzone Trivalent Flu Vaccine (Muli dose preparattion)  Notify office for further evaluation and treatment, questions or concerns if s/s fail to improve. The risks and benefits of my recommendations, as well as other treatment options were discussed with the patient today. Questions were answered.  Further disposition pending results of labs. Discussed med's effects and SE's.    Over 20 minutes of exam, counseling, chart review, and critical decision making was performed.   Future Appointments  Date Time Provider  Department Center  03/05/2023  3:45 PM Kathryne Hitch, MD [name]  03/12/2023  3:45 PM Kathryne Hitch, MD [name]  03/16/2023  3:00 PM Butch Penny, NP [facility] None  04/14/2023  3:45 PM Alfonse Spruce, MD [name]  04/28/2023  4:15 PM Hyatt, Max T, DPM [name]  04/29/2023  2:00 PM Joetta Delprado, Archie Patten, NP [name]    ------------------------------------------------------------------------------------------------------------------   HPI BP (!) 150/100   Pulse 80   Temp 97.6 F (36.4 C)   Ht 6' (1.829 m)   Wt (!) 335 lb (152 kg)   SpO2 98%   BMI 45.43 kg/m   Patient complains of foul smelling urine, suprapubic pressure, and urgency. He has had symptoms for 2 weeks. Patient also complains of back pain. Patient denies congestion, cough, fever, headache, and stomach ache. Patient does not have a history of recurrent UTI. Patient does not have a history of pyelonephritis.   BP is also currently elevated.  Most often well controlled.  Currently taking Losartan and Metoprolol.  He is asymptomatic.  Denies fever, chills, N/V.   BP Readings from Last 3 Encounters:  03/04/23 (!) 150/100  02/19/23 110/88  01/28/23 130/70    Past Medical History:  Diagnosis Date   Anesthesia complication requiring reversal agent administration    ? from central apnea, very difficult to get off vent   Anxiety    Arthritis    osteo   Asthma    BPH (benign prostatic hyperplasia)    Complication of anesthesia    difficulty waking , they twlight me because of my respiratory problems "   COPD (chronic obstructive pulmonary  disease) (HCC)    COVID    moderate case 2023   Depression    DVT (deep venous thrombosis) (HCC) 2023   right leg   Dyspnea    on exertion   Enlarged heart    Family history of adverse reaction to anesthesia    mother trouble waking up, and heart stopped   GERD (gastroesophageal reflux disease)    Headache    botox injections  for headaches   Hyperlipidemia    Hypertension    Hypogonadism male    IBS (irritable bowel syndrome)    Memory difficulties    short term memories   Neuropathy    Obesity    On home oxygen therapy    on 2 liter   OSA (obstructive sleep apnea)    not using cpap   Paralysis (HCC)    left hand - small    due to accident with an head injury   Pneumonia    Pre-diabetes    Prostatitis      Allergies  Allergen Reactions   Bee Venom Swelling   Cymbalta [Duloxetine Hcl] Shortness Of Breath    Brought on asthma   Ppd [Tuberculin Purified Protein Derivative] Other (See Comments)    +ppd NEG Quantferron Gold 3/13 (shows false positive)    Calan [Verapamil] Other (See Comments)    Back pain   Tricor [Fenofibrate] Other (See Comments)    Back pain   Claritin [Loratadine] Other (See Comments)    Unknown reaction    Levaquin [Levofloxacin] Diarrhea    Current Outpatient Medications on File Prior to Visit  Medication Sig   acetaminophen (TYLENOL) 500 MG tablet Take 1,000 mg by mouth 3 (three) times daily.   albuterol (VENTOLIN HFA) 108 (90 Base) MCG/ACT inhaler Inhale 2 puffs into the lungs every 4 (four) hours as needed for wheezing or shortness of breath.   amoxicillin-clavulanate (AUGMENTIN) 875-125 MG tablet Take 1 tablet by mouth 2 (two) times daily.   anastrozole (ARIMIDEX) 1 MG tablet Take 1 mg by mouth daily.   ascorbic acid (VITAMIN C) 1000 MG tablet Take 1,000 mg by mouth every evening.   aspirin EC 81 MG tablet Take 81 mg by mouth daily. Swallow whole.   Azelastine HCl 137 MCG/SPRAY SOLN Place 2 sprays into the nose in the morning and at bedtime.   benzonatate (TESSALON) 200 MG capsule Take 1 capsule 3 x /day to Prevent Cough                                                     /                                  TAKE                               BY                              MOUTH   botulinum toxin Type A (BOTOX) 200 units injection Provider to inject 155 units into the  muscles of the head and neck every 12 weeks. Discard remainder.   budesonide (PULMICORT) 0.5 MG/2ML  nebulizer solution INHALE CONTENTS OF 1 VIAL VIA NEBULIZER 2 (TWO) TIMES DAILY AS NEEDED.   Calcium Carb-Cholecalciferol (CALCIUM + VITAMIN D3 PO) Take 1 tablet by mouth daily.   celecoxib (CELEBREX) 200 MG capsule TAKE 1 CAPSULE (200 MG TOTAL) BY MOUTH EVERY 12 (TWELVE) HOURS.   cetaphil (CETAPHIL) lotion Apply 1 Application topically 2 (two) times daily.   cetirizine (ZYRTEC) 10 MG tablet Take 1 tablet (10 mg total) by mouth in the morning.   Cholecalciferol (VITAMIN D3) 125 MCG (5000 UT) TABS Take 5,000-10,000 Units by mouth See admin instructions. Take 10,000 in the morning and 5,000 units in the evening   Cinnamon 500 MG capsule Take 1,000 mg by mouth 3 (three) times daily.   Coenzyme Q10 (CO Q 10 PO) Take 600 mg by mouth daily.   cyclobenzaprine (FLEXERIL) 10 MG tablet TAKE 1/2 TO 1 TABLET 2 TIMES A DAY ONLY IF NEED FOR MUSCLE SPASM (NECK)   Dextromethorphan HBr (DELSYM PO) Take by mouth.   diclofenac Sodium (VOLTAREN) 1 % GEL Apply 2-4 g topically 2 (two) times daily as needed (pain).   DM-APAP-CPM (CORICIDIN HBP PO) Take by mouth.   donepezil (ARICEPT) 23 MG TABS tablet TAKE 1 TABLET DAILY FOR MEMORY   EPINEPHrine 0.3 mg/0.3 mL IJ SOAJ injection Inject 0.3 mg into the muscle as needed for anaphylaxis.   furosemide (LASIX) 80 MG tablet Take 80 mg by mouth daily as needed for edema.   Guaifenesin 1200 MG TB12 Take 1,200 mg by mouth 3 (three) times daily.   ipratropium (ATROVENT) 0.03 % nasal spray Place 2 sprays into the nose 3 (three) times daily.   ipratropium-albuterol (DUONEB) 0.5-2.5 (3) MG/3ML SOLN Take 3 mLs by nebulization 2 (two) times daily as needed.   KLOR-CON M20 20 MEQ tablet TAKE 1 TABLET TWICE DAILY FOR POTASSIUM   Krill Oil 500 MG CAPS Take 500 mg by mouth 2 (two) times daily.   losartan (COZAAR) 25 MG tablet Take 1 tablet (25 mg total) by mouth daily.   Magnesium 500 MG  CAPS Take 500 mg by mouth 3 (three) times daily.   memantine (NAMENDA) 10 MG tablet Take 1 tablet (10 mg total) by mouth 2 (two) times daily.   metFORMIN (GLUCOPHAGE-XR) 500 MG 24 hr tablet TAKES 2 TABLETS 2 X /DAY WITH MEALS FOR DIABETES   metoprolol tartrate (LOPRESSOR) 25 MG tablet TAKE 1 TABLET 2 X /DAY (EVERY 12 HOURS) FOR BP / TAKE BY MOUTH   Misc Natural Products (GLUCOS-CHONDROIT-MSM COMPLEX) TABS Take 2 tablets by mouth 3 (three) times daily.   montelukast (SINGULAIR) 10 MG tablet TAKE 1 TABLET BY MOUTH DAILY FOR ALLERGIES   Multiple Vitamins-Minerals (MULTIVITAMIN WITH MINERALS) tablet Take 1 tablet by mouth every morning.   Multiple Vitamins-Minerals (PRESERVISION AREDS 2) CAPS Take 1 capsule by mouth 2 (two) times daily. Evening and night   oxymetazoline (AFRIN) 0.05 % nasal spray Place 1 spray into both nostrils at bedtime.   pentosan polysulfate (ELMIRON) 100 MG capsule Take 100 mg by mouth 3 (three) times daily.   pravastatin (PRAVACHOL) 40 MG tablet TAKE 1 TABLET BY MOUTH EVERY DAY AT BEDTIME FOR CHOLESTEROL   pregabalin (LYRICA) 150 MG capsule TAKE 1 CAPSULE 3 TIMES A DAY FOR CHRONIC PAIN   promethazine-dextromethorphan (PROMETHAZINE-DM) 6.25-15 MG/5ML syrup TAKE 5 MLS BY MOUTH 4 TIMES A DAY AS NEEDED FOR COUGH   Semaglutide, 2 MG/DOSE, (OZEMPIC, 2 MG/DOSE,) 8 MG/3ML SOPN Inject  2 mg  into Skin every 7 days for  Diabetes  (E11.29)   sertraline (ZOLOFT) 100 MG tablet TAKE 2 TABLETS DAILY FOR MOOD / DEPRESSION   sodium chloride (OCEAN) 0.65 % SOLN nasal spray Place 1 spray into both nostrils 4 (four) times daily as needed for congestion. Uses each time before other nasal sprays   tadalafil (CIALIS) 5 MG tablet Take 5 mg by mouth daily.    TRELEGY ELLIPTA 100-62.5-25 MCG/ACT AEPB TAKE 1 PUFF BY MOUTH EVERY DAY   trospium (SANCTURA) 20 MG tablet Take 20 mg by mouth 2 (two) times daily. In the evening and at bedtime   TURMERIC PO Take 2,000 mg by mouth 3 (three) times daily. 1000 mg  each   zinc gluconate 50 MG tablet Take 50 mg by mouth 3 (three) times daily.   No current facility-administered medications on file prior to visit.    ROS: all negative except what is noted in the HPI.   Physical Exam:  BP (!) 150/100   Pulse 80   Temp 97.6 F (36.4 C)   Ht 6' (1.829 m)   Wt (!) 335 lb (152 kg)   SpO2 98%   BMI 45.43 kg/m   General Appearance: NAD.  Awake, conversant and cooperative. Eyes: PERRLA, EOMs intact.  Sclera white.  Conjunctiva without erythema. Sinuses: No frontal/maxillary tenderness.  No nasal discharge. Nares patent.  ENT/Mouth: Ext aud canals clear.  Bilateral TMs w/DOL and without erythema or bulging. Hearing intact.  Posterior pharynx without swelling or exudate.  Tonsils without swelling or erythema.  Neck: Supple.  No masses, nodules or thyromegaly. Respiratory: Effort is regular with non-labored breathing. Breath sounds are equal bilaterally without rales, rhonchi, wheezing or stridor.  Cardio: RRR with no MRGs. Brisk peripheral pulses without edema.  Abdomen: Active BS in all four quadrants.  Soft and non-tender without guarding, rebound tenderness, hernias or masses. Lymphatics: Non tender without lymphadenopathy.  Musculoskeletal: Full ROM, 5/5 strength, normal ambulation.  No clubbing or cyanosis. Skin: Appropriate color for ethnicity. Warm without rashes, lesions, ecchymosis, ulcers.  Neuro: CN II-XII grossly normal. Normal muscle tone without cerebellar symptoms and intact sensation.   Psych: AO X 3,  appropriate mood and affect, insight and judgment.     Adela Glimpse, NP 10:01 PM [facility]

## 2023-03-04 NOTE — Patient Instructions (Signed)
Urinary Tract Infection, Male A urinary tract infection (UTI) is an infection in your urinary tract. The urinary tract is made up of the organs that make, store, and get rid of pee (urine) in your body. These organs include: The kidneys. The ureters. The bladder. The urethra. What are the causes? Most UTIs are caused by germs called bacteria. They may be in or near your genitals. These germs grow and cause swelling in your urinary tract. What increases the risk? You're more likely to get a UTI if: You have a soft tube called a catheter that drains your pee. You can't control when you pee or poop. You have trouble peeing because of: A prostate that's bigger than normal. A kidney stone. A urinary blockage. A nerve condition that affects your bladder. Not getting enough to drink. You're sexually active. You're an older adult. You're also more likely to get a UTI if you have other health problems, such as: Diabetes. A weak immune system. Your immune system is your body's defense system. Sickle cell disease. Injury of the spine. What are the signs or symptoms? Symptoms may include: Needing to pee right away. Peeing small amounts often. Trouble getting the stream started. Pain or burning when you pee. Blood in your pee. Pee that smells bad or odd. Pain in your belly or lower back. You may also: Feel confused. This may be the first symptom in older adults. Vomit. Not feel hungry. Feel tired or easily annoyed. Have a fever or chills. How is this diagnosed? A UTI is diagnosed based on your medical history and an exam. You may also have other tests. These may include: Pee tests. Blood tests. Tests for sexually transmitted infections (STIs). If you've had more than one UTI, you may need to have imaging studies done to find out why you keep getting them. How is this treated? A UTI can be treated by: Taking antibiotics or other medicines. Drinking enough fluid to keep your pee  pale yellow. In rare cases, a UTI can cause a very bad condition called sepsis. Sepsis may be treated in the hospital. Follow these instructions at home: Medicines Take your medicines only as told by your health care provider. If you were given antibiotics, take them as told by your provider. Do not stop taking them even if you start to feel better. General instructions Make sure you: Pee often and fully. Do not hold your pee for a long time. Pee after you have sex. Contact a health care provider if: Your symptoms don't get better after 1-2 days of taking antibiotics. Your symptoms go away and then come back. You have a fever or chills. You vomit or feel like you may vomit. Get help right away if: You have very bad pain in your back or lower belly. You faint. This information is not intended to replace advice given to you by your health care provider. Make sure you discuss any questions you have with your health care provider. Document Revised: 08/15/2022 Document Reviewed: 08/15/2022 Elsevier Patient Education  2024 [employer].

## 2023-03-05 ENCOUNTER — Encounter: Payer: Self-pay | Admitting: Orthopaedic Surgery

## 2023-03-05 ENCOUNTER — Ambulatory Visit: Payer: BC Managed Care – PPO | Admitting: Orthopaedic Surgery

## 2023-03-05 DIAGNOSIS — M1711 Unilateral primary osteoarthritis, right knee: Secondary | ICD-10-CM

## 2023-03-05 DIAGNOSIS — M1712 Unilateral primary osteoarthritis, left knee: Secondary | ICD-10-CM

## 2023-03-05 DIAGNOSIS — M170 Bilateral primary osteoarthritis of knee: Secondary | ICD-10-CM | POA: Diagnosis not present

## 2023-03-05 LAB — URINALYSIS, ROUTINE W REFLEX MICROSCOPIC
Bacteria, UA: NONE SEEN /[HPF]
Bilirubin Urine: NEGATIVE
Glucose, UA: NEGATIVE
Hgb urine dipstick: NEGATIVE
Ketones, ur: NEGATIVE
Leukocytes,Ua: NEGATIVE
Nitrite: NEGATIVE
RBC / HPF: NONE SEEN /[HPF] (ref 0–2)
Specific Gravity, Urine: 1.02 (ref 1.001–1.035)
Squamous Epithelial / HPF: NONE SEEN /[HPF] (ref <=5)
WBC, UA: NONE SEEN /[HPF] (ref 0–5)
pH: 7 (ref 5.0–8.0)

## 2023-03-05 LAB — MICROSCOPIC MESSAGE

## 2023-03-05 LAB — URINE CULTURE
MICRO NUMBER:: 15573272
Result:: NO GROWTH
SPECIMEN QUALITY:: ADEQUATE

## 2023-03-05 MED ORDER — HYALURONAN 30 MG/2ML IX SOSY
30.0000 mg | PREFILLED_SYRINGE | INTRA_ARTICULAR | Status: AC | PRN
Start: 2023-03-05 — End: 2023-03-05
  Administered 2023-03-05: 30 mg via INTRA_ARTICULAR

## 2023-03-05 MED ORDER — LIDOCAINE HCL 1 % IJ SOLN
3.0000 mL | INTRAMUSCULAR | Status: AC | PRN
Start: 2023-03-05 — End: 2023-03-05
  Administered 2023-03-05: 3 mL

## 2023-03-05 NOTE — Progress Notes (Signed)
Procedure Note  Patient: Mark Harrell.             Date of Birth: 1961-03-04           MRN: [id number]             Visit Date: 03/05/2023  Procedures: Visit Diagnoses:  1. Unilateral primary osteoarthritis, left knee   2. Unilateral primary osteoarthritis, right knee     Large Joint Inj: R knee on 03/05/2023 4:02 PM Indications: diagnostic evaluation and pain Details: 22 G 1.5 in needle, superolateral approach  Arthrogram: No  Medications: 3 mL lidocaine 1 %; 30 mg Hyaluronan 30 MG/2ML Outcome: tolerated well, no immediate complications Procedure, treatment alternatives, risks and benefits explained, specific risks discussed. Consent was given by the patient. Immediately prior to procedure a time out was called to verify the correct patient, procedure, equipment, support staff and site/side marked as required. Patient was prepped and draped in the usual sterile fashion.    Large Joint Inj: L knee on 03/05/2023 4:02 PM Indications: diagnostic evaluation and pain Details: 22 G 1.5 in needle, superolateral approach  Arthrogram: No  Medications: 30 mg Hyaluronan 30 MG/2ML Outcome: tolerated well, no immediate complications Procedure, treatment alternatives, risks and benefits explained, specific risks discussed. Consent was given by the patient. Immediately prior to procedure a time out was called to verify the correct patient, procedure, equipment, support staff and site/side marked as required. Patient was prepped and draped in the usual sterile fashion.    The patient is here today for injection #2 of a series of 3 hyaluronic acid injections in both his knees to treat the pain from osteoarthritis.  He did has not had any relief from the first injections.  He has an effusion on his right knee that he needs aspirated today as well.  I did place Orthovisc in both knees today without difficulty.  I aspirated about 30 cc of fluid from his right knee.  Will see him back next week  for injection #3 of a series of 3 Orthovisc injections to treat the pain from osteoarthritis.

## 2023-03-12 ENCOUNTER — Ambulatory Visit: Payer: BC Managed Care – PPO | Admitting: Orthopaedic Surgery

## 2023-03-12 DIAGNOSIS — M1712 Unilateral primary osteoarthritis, left knee: Secondary | ICD-10-CM

## 2023-03-12 DIAGNOSIS — M170 Bilateral primary osteoarthritis of knee: Secondary | ICD-10-CM | POA: Diagnosis not present

## 2023-03-12 DIAGNOSIS — M1711 Unilateral primary osteoarthritis, right knee: Secondary | ICD-10-CM

## 2023-03-12 MED ORDER — HYALURONAN 30 MG/2ML IX SOSY
30.0000 mg | PREFILLED_SYRINGE | INTRA_ARTICULAR | Status: AC | PRN
Start: 2023-03-12 — End: 2023-03-12
  Administered 2023-03-12: 30 mg via INTRA_ARTICULAR

## 2023-03-12 NOTE — Progress Notes (Signed)
Procedure Note  Patient: Mark Harrell.             Date of Birth: [birth year]           MRN: [id number]             Visit Date: 03/12/2023  Procedures: Visit Diagnoses:  1. Unilateral primary osteoarthritis, left knee   2. Unilateral primary osteoarthritis, right knee     Large Joint Inj: R knee on 03/12/2023 3:46 PM Indications: diagnostic evaluation and pain Details: 22 G 1.5 in needle, superolateral approach  Arthrogram: No  Medications: 30 mg Hyaluronan 30 MG/2ML Outcome: tolerated well, no immediate complications Procedure, treatment alternatives, risks and benefits explained, specific risks discussed. Consent was given by the patient. Immediately prior to procedure a time out was called to verify the correct patient, procedure, equipment, support staff and site/side marked as required. Patient was prepped and draped in the usual sterile fashion.    Large Joint Inj: L knee on 03/12/2023 3:47 PM Indications: diagnostic evaluation and pain Details: 22 G 1.5 in needle, superolateral approach  Arthrogram: No  Medications: 30 mg Hyaluronan 30 MG/2ML Outcome: tolerated well, no immediate complications Procedure, treatment alternatives, risks and benefits explained, specific risks discussed. Consent was given by the patient. Immediately prior to procedure a time out was called to verify the correct patient, procedure, equipment, support staff and site/side marked as required. Patient was prepped and draped in the usual sterile fashion.     The patient is here for injection #3 in each knee of Orthovisc to treat the pain from osteoarthritis.  He has had no adverse reactions the first 2 injections.  He does have some swelling in both of his knees though.  I did place Orthovisc No. 3 in both knees which she tolerated well.  Will see him back in 6 weeks to see how he is doing overall.  No x-rays are needed at that visit but we do need a weight and BMI calculation at that next  visit.

## 2023-03-15 NOTE — Progress Notes (Unsigned)
03/16/23: botox is working well. Reports on and off neck pain. Reports that orthopedist said that botox may help.  12/22/22: First botox with our office. When getting botox through [facility] it was beneficial for his migraines.   BOTOX PROCEDURE NOTE FOR MIGRAINE HEADACHE    Contraindications and precautions discussed with patient(above). Aseptic procedure was observed and patient tolerated procedure. Procedure performed by Butch Penny, NP  The condition has existed for more than 6 months, and pt does not have a diagnosis of ALS, Myasthenia Gravis or Lambert-Eaton Syndrome.  Risks and benefits of injections discussed and pt agrees to proceed with the procedure.  Written consent obtained    Indication/Diagnosis: chronic migraine BOTOX(J0585) injection was performed according to protocol by [employer]. 200 units of BOTOX was dissolved into 4 cc NS.   NDC: [address]-63  Type of toxin: Botox    Botox- 200 units x 1 vial Lot: [id number] Expiration: 06/2025 NDC: 9518-8416-60   Bacteriostatic 0.9% Sodium Chloride- 4 mL  Lot: [id number] Expiration: 08/25/2023 NDC: 1093-2355-73   Dx: U20.254       Description of procedure:  The patient was placed in a sitting position. The standard protocol was used for Botox as follows, with 5 units of Botox injected at each site:   -Procerus muscle, midline injection  -Corrugator muscle, bilateral injection  -Frontalis muscle, bilateral injection, with 2 sites each side, medial injection was performed in the upper one third of the frontalis muscle, in the region vertical from the medial inferior edge of the superior orbital rim. The lateral injection was again in the upper one third of the forehead vertically above the lateral limbus of the cornea, 1.5 cm lateral to the medial injection site.  -Temporalis muscle injection, 4 sites, bilaterally. The first injection was 3 cm above the tragus of the ear, second injection site was 1.5 cm to 3 cm up  from the first injection site in line with the tragus of the ear. The third injection site was 1.5-3 cm forward between the first 2 injection sites. The fourth injection site was 1.5 cm posterior to the second injection site.  -Occipitalis muscle injection, 3 sites, bilaterally. The first injection was done one half way between the occipital protuberance and the tip of the mastoid process behind the ear. The second injection site was done lateral and superior to the first, 1 fingerbreadth from the first injection. The third injection site was 1 fingerbreadth superiorly and medially from the first injection site.  -Cervical paraspinal muscle injection, 2 sites, bilateral knee first injection site was 1 cm from the midline of the cervical spine, 3 cm inferior to the lower border of the occipital protuberance. The second injection site was 1.5 cm superiorly and laterally to the first injection site.  -Trapezius muscle injection was performed at 3 sites, bilaterally. The first injection site was in the upper trapezius muscle halfway between the inflection point of the neck, and the acromion. The second injection site was one half way between the acromion and the first injection site. The third injection was done between the first injection site and the inflection point of the neck.   Will return for repeat injection in 3 months.   A 200 units of Botox was used, 155 units were injected, the rest of the Botox was wasted. The patient tolerated the procedure well, there were no complications of the above procedure.  Butch Penny, MSN, NP-C 03/15/2023, 2:16 PM [facility] [name] [place], [place] [address] [phone]  [phone]  

## 2023-03-16 ENCOUNTER — Ambulatory Visit: Payer: BC Managed Care – PPO | Admitting: Adult Health

## 2023-03-16 ENCOUNTER — Encounter: Payer: Self-pay | Admitting: Nurse Practitioner

## 2023-03-16 DIAGNOSIS — G43709 Chronic migraine without aura, not intractable, without status migrainosus: Secondary | ICD-10-CM | POA: Diagnosis not present

## 2023-03-16 MED ORDER — ONABOTULINUMTOXINA 200 UNITS IJ SOLR
155.0000 [IU] | Freq: Once | INTRAMUSCULAR | Status: AC
Start: 2023-03-16 — End: 2023-03-16
  Administered 2023-03-16: 155 [IU] via INTRAMUSCULAR

## 2023-03-16 NOTE — Progress Notes (Signed)
Botox- 200 units x 1 vial Lot: [id number] Expiration: 06/2025 NDC: 4403-4742-59  Bacteriostatic 0.9% Sodium Chloride- 4 mL  Lot: [id number] Expiration: 08/25/2023 NDC: 6433-2951-88  Dx: C16.606 B/B Witnessed by :Maryjean Ka

## 2023-03-17 MED ORDER — LOSARTAN POTASSIUM 100 MG PO TABS: 100.0000 mg | ORAL_TABLET | Freq: Every day | ORAL | 11 refills | Status: DC

## 2023-03-26 ENCOUNTER — Other Ambulatory Visit: Payer: Self-pay | Admitting: Nurse Practitioner

## 2023-03-26 DIAGNOSIS — R8290 Unspecified abnormal findings in urine: Secondary | ICD-10-CM

## 2023-03-26 DIAGNOSIS — N3941 Urge incontinence: Secondary | ICD-10-CM

## 2023-03-26 DIAGNOSIS — R1030 Lower abdominal pain, unspecified: Secondary | ICD-10-CM

## 2023-03-27 ENCOUNTER — Other Ambulatory Visit: Payer: Self-pay | Admitting: Internal Medicine

## 2023-03-27 ENCOUNTER — Encounter: Payer: Self-pay | Admitting: Internal Medicine

## 2023-03-27 DIAGNOSIS — S161XXD Strain of muscle, fascia and tendon at neck level, subsequent encounter: Secondary | ICD-10-CM

## 2023-03-27 MED ORDER — CYCLOBENZAPRINE HCL 10 MG PO TABS: ORAL_TABLET | ORAL | 0 refills | Status: DC

## 2023-03-30 DIAGNOSIS — I129 Hypertensive chronic kidney disease with stage 1 through stage 4 chronic kidney disease, or unspecified chronic kidney disease: Secondary | ICD-10-CM | POA: Diagnosis not present

## 2023-03-30 DIAGNOSIS — E559 Vitamin D deficiency, unspecified: Secondary | ICD-10-CM | POA: Diagnosis not present

## 2023-03-30 DIAGNOSIS — R809 Proteinuria, unspecified: Secondary | ICD-10-CM | POA: Diagnosis not present

## 2023-03-30 DIAGNOSIS — E1129 Type 2 diabetes mellitus with other diabetic kidney complication: Secondary | ICD-10-CM | POA: Diagnosis not present

## 2023-04-08 ENCOUNTER — Encounter: Payer: Self-pay | Admitting: Internal Medicine

## 2023-04-14 ENCOUNTER — Other Ambulatory Visit: Payer: Self-pay

## 2023-04-14 ENCOUNTER — Encounter: Payer: Self-pay | Admitting: Internal Medicine

## 2023-04-14 ENCOUNTER — Ambulatory Visit: Payer: BC Managed Care – PPO | Admitting: Allergy & Immunology

## 2023-04-14 ENCOUNTER — Ambulatory Visit: Payer: BC Managed Care – PPO | Admitting: Internal Medicine

## 2023-04-14 VITALS — BP 138/96 | HR 85 | Temp 98.2°F | Ht 72.0 in | Wt 349.3 lb

## 2023-04-14 DIAGNOSIS — J4489 Other specified chronic obstructive pulmonary disease: Secondary | ICD-10-CM

## 2023-04-14 DIAGNOSIS — J3089 Other allergic rhinitis: Secondary | ICD-10-CM

## 2023-04-14 DIAGNOSIS — K219 Gastro-esophageal reflux disease without esophagitis: Secondary | ICD-10-CM

## 2023-04-14 DIAGNOSIS — B999 Unspecified infectious disease: Secondary | ICD-10-CM | POA: Diagnosis not present

## 2023-04-14 DIAGNOSIS — J302 Other seasonal allergic rhinitis: Secondary | ICD-10-CM

## 2023-04-14 DIAGNOSIS — Z9889 Other specified postprocedural states: Secondary | ICD-10-CM

## 2023-04-14 MED ORDER — CETIRIZINE HCL 10 MG PO TABS: 10.0000 mg | ORAL_TABLET | Freq: Every day | ORAL | 1 refills | Status: DC | PRN

## 2023-04-14 MED ORDER — TRELEGY ELLIPTA 100-62.5-25 MCG/ACT IN AEPB: 1.0000 | INHALATION_SPRAY | Freq: Every day | RESPIRATORY_TRACT | 5 refills | Status: DC

## 2023-04-14 MED ORDER — AZELASTINE HCL 137 MCG/SPRAY NA SOLN: 2.0000 | Freq: Two times a day (BID) | NASAL | 1 refills | Status: DC | PRN

## 2023-04-14 MED ORDER — ALBUTEROL SULFATE HFA 108 (90 BASE) MCG/ACT IN AERS: 1.0000 | INHALATION_SPRAY | Freq: Four times a day (QID) | RESPIRATORY_TRACT | 1 refills | Status: DC | PRN

## 2023-04-14 MED ORDER — IPRATROPIUM BROMIDE 0.03 % NA SOLN: 2.0000 | Freq: Three times a day (TID) | NASAL | 1 refills | Status: DC | PRN

## 2023-04-14 MED ORDER — MONTELUKAST SODIUM 10 MG PO TABS: 10.0000 mg | ORAL_TABLET | Freq: Every day | ORAL | 1 refills | Status: DC

## 2023-04-14 NOTE — Progress Notes (Signed)
FOLLOW UP Date of Service/Encounter:  04/14/23   Subjective:  Mark Harrell. (DOB: Oct 06, 1960) is a 62 y.o. male who [facility] for follow up for recurrent infections, asthma COPD overlap, allergic rhinitis and GERD.   History obtained from: chart review and patient. Last visit with Dr. Dellis Anes was doing well without infections on prophylactic doxycyline.  For allergies Azelastine/Atrovent/Zyrtec/Singulair.  For asthma, on Trelegy/Singulair/PRN Albuterol.  For reflux, lifestyle measures.    Since last visit, he reports breathing is doing well.  He is on Singulair and Trelegy 1 puff daily.  Has not had much trouble with wheezing/SOB.  He usually uses albuterol prior to activity but due to knee issues has been limited.  Rarely needs albuterol outside of that.  No ER visits/oral prednisone since last visit either. Seen by Dr Renold Genta for OSA and COPD and abnormal CT with bilateral basilar nodular infiltrates and stable in size.    No infections since last visit.  Has been on doxycycline 100mg  BID several years. Denies any trouble with swallowing, reflux, heartburn.   Allergies are up and down depending on season.  They are controlled with medications.  He used to see ENT previously and would like to see them back as he has undergone sinus surgeries in the past. Taking Azelastine, Ipratropium, Zyrtec PRN.    Past Medical History: Past Medical History:  Diagnosis Date   Anesthesia complication requiring reversal agent administration    ? from central apnea, very difficult to get off vent   Anxiety    Arthritis    osteo   Asthma    BPH (benign prostatic hyperplasia)    Complication of anesthesia    difficulty waking , they twlight me because of my respiratory problems "   COPD (chronic obstructive pulmonary disease) (HCC)    COVID    moderate case 2023   Depression    DVT (deep venous thrombosis) (HCC) 2023   right leg   Dyspnea    on  exertion   Enlarged heart    Family history of adverse reaction to anesthesia    mother trouble waking up, and heart stopped   GERD (gastroesophageal reflux disease)    Headache    botox injections for headaches   Hyperlipidemia    Hypertension    Hypogonadism male    IBS (irritable bowel syndrome)    Memory difficulties    short term memories   Neuropathy    Obesity    On home oxygen therapy    on 2 liter   OSA (obstructive sleep apnea)    not using cpap   Paralysis (HCC)    left hand - small    due to accident with an head injury   Pneumonia    Pre-diabetes    Prostatitis     Objective:  BP (!) 150/100  There is no height or weight on file to calculate BMI. Physical Exam: GEN: alert, well developed HEENT: clear conjunctiva, TM grey and translucent, nose with mild inferior turbinate hypertrophy, pink nasal mucosa, clear rhinorrhea, no cobblestoning HEART: regular rate and rhythm, no murmur LUNGS: clear to auscultation bilaterally, no coughing, unlabored respiration SKIN: no rashes or lesions  Spirometry:  Tracings reviewed. His effort: Good reproducible efforts. FVC: 3.35L, 69% predicted FEV1: 2.27L, 61% predicted FEV1/FVC ratio: 68% Interpretation: Spirometry consistent with mixed obstructive and restrictive disease.  Please see scanned spirometry results for details.   Assessment:   1. Seasonal  and perennial allergic rhinitis   2. Recurrent infections   3. Asthma-COPD overlap syndrome (HCC)   4. Gastroesophageal reflux disease, unspecified whether esophagitis present     Plan/Recommendations:  1. 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 - Will consider starting immunoglobulin replacement in the future.  2. Asthma-COPD overlap syndrome - Well controlled. Spirometry with  restriction + obstruction, stable from prior. Also followed by Pulm for COPD and nodular infiltrates.  - Daily controller medication(s): Trelegy one puff once daily and Singulair 10mg  daily.  - Prior to physical activity: albuterol 2 puffs 10-15 minutes before physical activity - Rescue medications: albuterol 1-2 puffs every 4-6 hours as needed or DuoNeb nebulizer one vial every 4-6 hours as needed - Asthma control goals:  * Full participation in all desired activities (may need albuterol before activity) * Albuterol use two time or less a week on average (not counting use with activity) * Cough interfering with sleep two time or less a month * Oral steroids no more than once a year * No hospitalizations   3. Allergic rhinitis (sweet vernal grass, box elder, cat, weeds, ragweed, molds, cockroach, dust mite) - Controlled. Would like to see ENT due to hx of prior sinus surgeries.  Referral placed.  - Continue with saline mist 1-2 times daily.  - Continue with Azelastine 2 sprays per nostril up to twice daily.  Aim upward and outward.  - Continue with Ipratropium one spray per nostril every 8 hours as needed for runny noses.  - Continue with Cetirizine 10mg  daily as needed for runny nose, sneezing, itchy watery eyes.  - Continue with Singulair/Montelukast 10mg  in the evening.   4. GERD -Avoid lying down for at least two hours after a meal or after drinking acidic beverages, like soda, or other caffeinated beverages. This can help to prevent stomach contents from flowing back into the esophagus. -Keep your head elevated while you sleep. Using an extra pillow or two can also help to prevent reflux. -Eat smaller and more frequent meals each day instead of a few large meals. This promotes digestion and can aid in preventing heartburn. -Wear loose-fitting clothes to ease pressure on the stomach, which can worsen heartburn and reflux. -Reduce excess weight around the midsection. This can ease pressure  on the stomach. Such pressure can force some stomach contents back up the esophagus.   Return in about 3 months (around 07/15/2023).  Alesia Morin, MD [facility]

## 2023-04-14 NOTE — Addendum Note (Signed)
Addended by: Philipp Deputy on: 04/14/2023 06:34 PM   Modules accepted: Orders

## 2023-04-14 NOTE — Patient Instructions (Addendum)
1. 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 - Will consider starting immunoglobulin replacement in the future.  2. Asthma-COPD overlap syndrome - Daily controller medication(s): Trelegy one puff once daily and Singulair 10mg  daily.  - Prior to physical activity: albuterol 2 puffs 10-15 minutes before physical activity - Rescue medications: albuterol 1-2 puffs every 4-6 hours as needed or DuoNeb nebulizer one vial every 4-6 hours as needed - Asthma control goals:  * Full participation in all desired activities (may need albuterol before activity) * Albuterol use two time or less a week on average (not counting use with activity) * Cough interfering with sleep two time or less a month * Oral steroids no more than once a year * No hospitalizations   3. Allergic rhinitis (sweet vernal grass, box elder, cat, weeds, ragweed, molds, cockroach, dust mite) - Continue with saline mist 1-2 times daily.  - Continue with Azelastine 2 sprays per nostril up to twice daily.  Aim upward and outward.  - Continue with Ipratropium one spray per nostril every 8 hours as needed for runny noses.  - Continue with Cetirizine 10mg  daily as needed for runny nose, sneezing, itchy watery eyes.  - Continue with Singulair/Montelukast 10mg  in the evening.   4. GERD -Avoid lying down for at least two hours after a meal or after drinking acidic beverages, like soda, or other caffeinated beverages. This can help to prevent stomach contents from flowing back into the esophagus. -Keep your head elevated while you sleep. Using an extra pillow or two can also help to prevent reflux. -Eat smaller and more frequent meals each day instead of a few large meals. This promotes digestion and can aid in preventing heartburn. -Wear loose-fitting clothes to ease  pressure on the stomach, which can worsen heartburn and reflux. -Reduce excess weight around the midsection. This can ease pressure on the stomach. Such pressure can force some stomach contents back up the esophagus.    5. Return in about 3 months (around 07/15/2023).

## 2023-04-22 ENCOUNTER — Other Ambulatory Visit: Payer: Self-pay

## 2023-04-22 ENCOUNTER — Ambulatory Visit: Payer: BC Managed Care – PPO | Admitting: Orthopaedic Surgery

## 2023-04-22 ENCOUNTER — Telehealth: Payer: Self-pay

## 2023-04-22 ENCOUNTER — Encounter: Payer: Self-pay | Admitting: Orthopaedic Surgery

## 2023-04-22 VITALS — Ht 72.0 in | Wt 352.0 lb

## 2023-04-22 DIAGNOSIS — G8929 Other chronic pain: Secondary | ICD-10-CM

## 2023-04-22 DIAGNOSIS — M25562 Pain in left knee: Secondary | ICD-10-CM

## 2023-04-22 DIAGNOSIS — M25561 Pain in right knee: Secondary | ICD-10-CM | POA: Diagnosis not present

## 2023-04-22 MED ORDER — LIDOCAINE HCL 1 % IJ SOLN
3.0000 mL | INTRAMUSCULAR | Status: AC | PRN
  Administered 2023-04-22: 3 mL

## 2023-04-22 MED ORDER — METHYLPREDNISOLONE ACETATE 40 MG/ML IJ SUSP
40.0000 mg | INTRAMUSCULAR | Status: AC | PRN
  Administered 2023-04-22: 40 mg via INTRA_ARTICULAR

## 2023-04-22 NOTE — Telephone Encounter (Signed)
Referral has been placed. Patient has been informed via MyChart.

## 2023-04-22 NOTE — Progress Notes (Signed)
The patient continues to deal with chronic pain in both of his knees.  About 6 weeks ago we did place hyaluronic acid in both knees and that is not helped as much as he would like.  His weight today is 352 pounds giving him a BMI of 47.74.  He knows that he is not a surgical candidate right now until he loses significant amount of weight.  He is interested in aquatic therapy and would like to go to [place] for this after the first of the year.  I think this certainly could help from an overall therapy perspective on his shoulders and his knees.  He has been on Ozempic in the past and had to come off of that for several procedures.  He is a diabetic so he is going to get back on his Ozempic.  His last hemoglobin A1c was below 6.  He is requesting a steroid injection in both of his knees today to get him through the holidays.  Both knees show medial joint line tenderness and slight varus malalignment.  Previous x-rays show tricompartment arthritis of both knees worse on the left than the right.  I did place a steroid injection in both knees today.  He will watch his blood glucose closely.  I let him know the follow-up can be as needed for now and to wait at least until March or April before considering injections again.    Procedure Note  Patient: Mark Harrell.             Date of Birth: 02/15/1961           MRN: [id number]             Visit Date: 04/22/2023  Procedures: Visit Diagnoses:  1. Chronic pain of left knee   2. Chronic pain of right knee     Large Joint Inj: R knee on 04/22/2023 3:38 PM Indications: diagnostic evaluation and pain Details: 22 G 1.5 in needle, superolateral approach  Arthrogram: No  Medications: 3 mL lidocaine 1 %; 40 mg methylPREDNISolone acetate 40 MG/ML Outcome: tolerated well, no immediate complications Procedure, treatment alternatives, risks and benefits explained, specific risks discussed. Consent was given by the patient. Immediately prior to procedure  a time out was called to verify the correct patient, procedure, equipment, support staff and site/side marked as required. Patient was prepped and draped in the usual sterile fashion.    Large Joint Inj: L knee on 04/22/2023 3:38 PM Indications: diagnostic evaluation and pain Details: 22 G 1.5 in needle, superolateral approach  Arthrogram: No  Medications: 3 mL lidocaine 1 %; 40 mg methylPREDNISolone acetate 40 MG/ML Outcome: tolerated well, no immediate complications Procedure, treatment alternatives, risks and benefits explained, specific risks discussed. Consent was given by the patient. Immediately prior to procedure a time out was called to verify the correct patient, procedure, equipment, support staff and site/side marked as required. Patient was prepped and draped in the usual sterile fashion.

## 2023-04-22 NOTE — Telephone Encounter (Signed)
-----   Message from Birder Robson sent at 04/14/2023  4:31 PM EST ----- Would like him to seen ENT; he wishes to be reevaluated. Has had sinus surgeries in the past.  No specific ENT, whomever is covered and first available.

## 2023-04-28 ENCOUNTER — Ambulatory Visit: Payer: BC Managed Care – PPO | Admitting: Podiatry

## 2023-04-28 ENCOUNTER — Encounter: Payer: Self-pay | Admitting: Podiatry

## 2023-04-28 DIAGNOSIS — D2371 Other benign neoplasm of skin of right lower limb, including hip: Secondary | ICD-10-CM

## 2023-04-28 DIAGNOSIS — D2372 Other benign neoplasm of skin of left lower limb, including hip: Secondary | ICD-10-CM

## 2023-04-28 DIAGNOSIS — B351 Tinea unguium: Secondary | ICD-10-CM

## 2023-04-28 DIAGNOSIS — M722 Plantar fascial fibromatosis: Secondary | ICD-10-CM | POA: Diagnosis not present

## 2023-04-28 DIAGNOSIS — M79676 Pain in unspecified toe(s): Secondary | ICD-10-CM

## 2023-04-28 MED ORDER — TRIAMCINOLONE ACETONIDE 40 MG/ML IJ SUSP
40.0000 mg | Freq: Once | INTRAMUSCULAR | Status: AC
  Administered 2023-04-28: 40 mg

## 2023-04-29 ENCOUNTER — Ambulatory Visit (INDEPENDENT_AMBULATORY_CARE_PROVIDER_SITE_OTHER): Payer: BC Managed Care – PPO | Admitting: Nurse Practitioner

## 2023-04-29 VITALS — BP 138/92 | HR 73 | Temp 98.0°F | Ht 72.0 in | Wt 351.2 lb

## 2023-04-29 DIAGNOSIS — N401 Enlarged prostate with lower urinary tract symptoms: Secondary | ICD-10-CM | POA: Diagnosis not present

## 2023-04-29 DIAGNOSIS — I10 Essential (primary) hypertension: Secondary | ICD-10-CM

## 2023-04-29 DIAGNOSIS — R350 Frequency of micturition: Secondary | ICD-10-CM

## 2023-04-29 DIAGNOSIS — J4489 Other specified chronic obstructive pulmonary disease: Secondary | ICD-10-CM

## 2023-04-29 DIAGNOSIS — Z136 Encounter for screening for cardiovascular disorders: Secondary | ICD-10-CM

## 2023-04-29 DIAGNOSIS — Z1389 Encounter for screening for other disorder: Secondary | ICD-10-CM

## 2023-04-29 DIAGNOSIS — S161XXD Strain of muscle, fascia and tendon at neck level, subsequent encounter: Secondary | ICD-10-CM

## 2023-04-29 DIAGNOSIS — Z125 Encounter for screening for malignant neoplasm of prostate: Secondary | ICD-10-CM | POA: Diagnosis not present

## 2023-04-29 DIAGNOSIS — Z79899 Other long term (current) drug therapy: Secondary | ICD-10-CM | POA: Diagnosis not present

## 2023-04-29 DIAGNOSIS — G4733 Obstructive sleep apnea (adult) (pediatric): Secondary | ICD-10-CM

## 2023-04-29 DIAGNOSIS — J4541 Moderate persistent asthma with (acute) exacerbation: Secondary | ICD-10-CM

## 2023-04-29 DIAGNOSIS — F3341 Major depressive disorder, recurrent, in partial remission: Secondary | ICD-10-CM

## 2023-04-29 DIAGNOSIS — Z0000 Encounter for general adult medical examination without abnormal findings: Secondary | ICD-10-CM | POA: Diagnosis not present

## 2023-04-29 DIAGNOSIS — F5102 Adjustment insomnia: Secondary | ICD-10-CM

## 2023-04-29 DIAGNOSIS — Z13220 Encounter for screening for lipoid disorders: Secondary | ICD-10-CM | POA: Diagnosis not present

## 2023-04-29 DIAGNOSIS — Z130 Encounter for screening for diseases of the blood and blood-forming organs and certain disorders involving the immune mechanism: Secondary | ICD-10-CM | POA: Diagnosis not present

## 2023-04-29 DIAGNOSIS — Z0001 Encounter for general adult medical examination with abnormal findings: Secondary | ICD-10-CM

## 2023-04-29 DIAGNOSIS — E785 Hyperlipidemia, unspecified: Secondary | ICD-10-CM

## 2023-04-29 DIAGNOSIS — E1122 Type 2 diabetes mellitus with diabetic chronic kidney disease: Secondary | ICD-10-CM

## 2023-04-29 DIAGNOSIS — M5416 Radiculopathy, lumbar region: Secondary | ICD-10-CM

## 2023-04-29 DIAGNOSIS — E559 Vitamin D deficiency, unspecified: Secondary | ICD-10-CM | POA: Diagnosis not present

## 2023-04-29 DIAGNOSIS — M4726 Other spondylosis with radiculopathy, lumbar region: Secondary | ICD-10-CM

## 2023-04-29 DIAGNOSIS — F0150 Vascular dementia without behavioral disturbance: Secondary | ICD-10-CM

## 2023-04-29 DIAGNOSIS — Z131 Encounter for screening for diabetes mellitus: Secondary | ICD-10-CM

## 2023-04-29 DIAGNOSIS — E538 Deficiency of other specified B group vitamins: Secondary | ICD-10-CM

## 2023-04-29 DIAGNOSIS — N181 Chronic kidney disease, stage 1: Secondary | ICD-10-CM

## 2023-04-29 DIAGNOSIS — M542 Cervicalgia: Secondary | ICD-10-CM

## 2023-04-29 DIAGNOSIS — G43709 Chronic migraine without aura, not intractable, without status migrainosus: Secondary | ICD-10-CM

## 2023-04-29 DIAGNOSIS — J3089 Other allergic rhinitis: Secondary | ICD-10-CM

## 2023-04-29 DIAGNOSIS — K219 Gastro-esophageal reflux disease without esophagitis: Secondary | ICD-10-CM

## 2023-04-29 DIAGNOSIS — E782 Mixed hyperlipidemia: Secondary | ICD-10-CM

## 2023-04-29 DIAGNOSIS — E1169 Type 2 diabetes mellitus with other specified complication: Secondary | ICD-10-CM

## 2023-04-29 MED ORDER — CYCLOBENZAPRINE HCL 10 MG PO TABS: ORAL_TABLET | ORAL | 2 refills | Status: DC

## 2023-04-29 MED ORDER — TIRZEPATIDE 2.5 MG/0.5ML ~~LOC~~ SOAJ
2.5000 mg | SUBCUTANEOUS | 2 refills | Status: DC
Start: 2023-04-29 — End: 2023-08-31

## 2023-04-29 MED ORDER — TRAZODONE HCL 50 MG PO TABS: 50.0000 mg | ORAL_TABLET | Freq: Every day | ORAL | 0 refills | Status: DC

## 2023-04-29 MED ORDER — METOPROLOL TARTRATE 25 MG PO TABS: 25.0000 mg | ORAL_TABLET | Freq: Three times a day (TID) | ORAL | 0 refills | Status: DC

## 2023-04-29 MED ORDER — MEMANTINE HCL 10 MG PO TABS: 10.0000 mg | ORAL_TABLET | Freq: Two times a day (BID) | ORAL | 3 refills | Status: DC

## 2023-04-29 NOTE — Progress Notes (Signed)
He presents today chief complaint of painful toenails and painful heel bilaterally.  Chronic heel pain.  Also has chronic knee pain.  Objective: Vital signs are stable alert oriented x 3.  Pulses are palpable.  There is no erythema edema cellulitis drainage or odor he has pain on palpation medial calcaneal tubercles bilateral.  His toenails are long thick yellow dystrophic clinically mycotic.  Assessment: Pain in limb secondary to onychomycosis and bilateral plantar fasciitis.  Plan: Debridement of toenails 1 through 5 bilateral secondary to thickness debris and pain.  Also injected the bilateral heels today with 10 mg Kenalog 5 mg Marcaine point maximal tenderness.
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
## 2023-04-30 ENCOUNTER — Encounter (INDEPENDENT_AMBULATORY_CARE_PROVIDER_SITE_OTHER): Payer: Self-pay | Admitting: Otolaryngology

## 2023-04-30 LAB — CBC WITH DIFFERENTIAL/PLATELET
Absolute Lymphocytes: 2977 {cells}/uL (ref 850–3900)
Absolute Monocytes: 1174 {cells}/uL — ABNORMAL HIGH (ref 200–950)
Basophils Absolute: 82 {cells}/uL (ref 0–200)
Basophils Relative: 0.8 %
Eosinophils Absolute: 227 {cells}/uL (ref 15–500)
Eosinophils Relative: 2.2 %
HCT: 47.7 % (ref 38.5–50.0)
Hemoglobin: 15.6 g/dL (ref 13.2–17.1)
MCH: 29.2 pg (ref 27.0–33.0)
MCHC: 32.7 g/dL (ref 32.0–36.0)
MCV: 89.2 fL (ref 80.0–100.0)
MPV: 10.7 fL (ref 7.5–12.5)
Monocytes Relative: 11.4 %
Neutro Abs: 5840 {cells}/uL (ref 1500–7800)
Neutrophils Relative %: 56.7 %
Platelets: 237 10*3/uL (ref 140–400)
RBC: 5.35 10*6/uL (ref 4.20–5.80)
RDW: 13.5 % (ref 11.0–15.0)
Total Lymphocyte: 28.9 %
WBC: 10.3 10*3/uL (ref 3.8–10.8)

## 2023-04-30 LAB — URINALYSIS, ROUTINE W REFLEX MICROSCOPIC
Bacteria, UA: NONE SEEN /[HPF]
Bilirubin Urine: NEGATIVE
Glucose, UA: NEGATIVE
Hgb urine dipstick: NEGATIVE
Hyaline Cast: NONE SEEN /[LPF]
Ketones, ur: NEGATIVE
Leukocytes,Ua: NEGATIVE
Nitrite: NEGATIVE
RBC / HPF: NONE SEEN /[HPF] (ref 0–2)
Specific Gravity, Urine: 1.026 (ref 1.001–1.035)
Squamous Epithelial / HPF: NONE SEEN /[HPF] (ref <=5)
WBC, UA: NONE SEEN /[HPF] (ref 0–5)
pH: 6.5 (ref 5.0–8.0)

## 2023-04-30 LAB — MICROALBUMIN / CREATININE URINE RATIO
Creatinine, Urine: 196 mg/dL (ref 20–320)
Microalb Creat Ratio: 17 mg/g{creat} (ref <30)
Microalb, Ur: 3.3 mg/dL

## 2023-04-30 LAB — HEMOGLOBIN A1C
Hgb A1c MFr Bld: 5.3 %{Hb} (ref <5.7)
Mean Plasma Glucose: 105 mg/dL
eAG (mmol/L): 5.8 mmol/L

## 2023-04-30 LAB — COMPLETE METABOLIC PANEL WITH GFR
AG Ratio: 1.7 (calc) (ref 1.0–2.5)
ALT: 24 U/L (ref 9–46)
AST: 15 U/L (ref 10–35)
Albumin: 3.9 g/dL (ref 3.6–5.1)
Alkaline phosphatase (APISO): 63 U/L (ref 35–144)
BUN: 21 mg/dL (ref 7–25)
CO2: 30 mmol/L (ref 20–32)
Calcium: 10.2 mg/dL (ref 8.6–10.3)
Chloride: 102 mmol/L (ref 98–110)
Creat: 0.79 mg/dL (ref 0.70–1.35)
Globulin: 2.3 g/dL (ref 1.9–3.7)
Glucose, Bld: 94 mg/dL (ref 65–99)
Potassium: 4.6 mmol/L (ref 3.5–5.3)
Sodium: 140 mmol/L (ref 135–146)
Total Bilirubin: 1.3 mg/dL — ABNORMAL HIGH (ref 0.2–1.2)
Total Protein: 6.2 g/dL (ref 6.1–8.1)
eGFR: 100 mL/min/{1.73_m2} (ref >=60)

## 2023-04-30 LAB — MAGNESIUM: Magnesium: 2 mg/dL (ref 1.5–2.5)

## 2023-04-30 LAB — TSH: TSH: 0.86 m[IU]/L (ref 0.40–4.50)

## 2023-04-30 LAB — LIPID PANEL
Cholesterol: 154 mg/dL (ref <200)
HDL: 34 mg/dL — ABNORMAL LOW (ref >=40)
LDL Cholesterol (Calc): 83 mg/dL
Non-HDL Cholesterol (Calc): 120 mg/dL (ref <130)
Total CHOL/HDL Ratio: 4.5 (calc) (ref <5.0)
Triglycerides: 280 mg/dL — ABNORMAL HIGH (ref <150)

## 2023-04-30 LAB — INSULIN, RANDOM: Insulin: 49.5 u[IU]/mL — ABNORMAL HIGH

## 2023-04-30 LAB — VITAMIN B12: Vitamin B-12: 623 pg/mL (ref 200–1100)

## 2023-04-30 LAB — PSA: PSA: 0.44 ng/mL (ref <=4.00)

## 2023-04-30 LAB — MICROSCOPIC MESSAGE

## 2023-04-30 LAB — VITAMIN D 25 HYDROXY (VIT D DEFICIENCY, FRACTURES): Vit D, 25-Hydroxy: 94 ng/mL (ref 30–100)

## 2023-05-06 DIAGNOSIS — K5790 Diverticulosis of intestine, part unspecified, without perforation or abscess without bleeding: Secondary | ICD-10-CM | POA: Diagnosis not present

## 2023-05-06 DIAGNOSIS — K580 Irritable bowel syndrome with diarrhea: Secondary | ICD-10-CM | POA: Diagnosis not present

## 2023-05-06 DIAGNOSIS — K648 Other hemorrhoids: Secondary | ICD-10-CM | POA: Diagnosis not present

## 2023-05-06 DIAGNOSIS — Z860101 Personal history of adenomatous and serrated colon polyps: Secondary | ICD-10-CM | POA: Diagnosis not present

## 2023-05-11 NOTE — Patient Instructions (Signed)
Trazodone Tablets What is this medication? TRAZODONE (TRAZ oh done) treats depression. It increases the amount of serotonin in the brain, a hormone that helps regulate mood. This medicine may be used for other purposes; ask your health care provider or pharmacist if you have questions. COMMON BRAND NAME(S): Desyrel What should I tell my care team before I take this medication? They need to know if you have any of these conditions: Bipolar disorder Bleeding disorder Glaucoma Heart disease, or previous heart attack Irregular heartbeat or rhythm Kidney disease Liver disease Low levels of sodium in the blood Suicidal thoughts, plans, or attempt by you or a family member An unusual or allergic reaction to trazodone, other medications, foods, dyes, or preservatives Pregnant or trying to get pregnant Breastfeeding How should I use this medication? Take this medication by mouth with a glass of water. Take it as directed on the prescription label at the same time every day. Take this medication shortly after a meal or a light snack. Keep taking this medication unless your care team tells you to stop. Stopping it too quickly can cause serious side effects. It can also make your condition worse. A special MedGuide will be given to you by the pharmacist with each prescription and refill. Be sure to read this information carefully each time. Talk to your care team about the use of this medication in children. Special care may be needed. Overdosage: If you think you have taken too much of this medicine contact a poison control center or emergency room at once. NOTE: This medicine is only for you. Do not share this medicine with others. What if I miss a dose? If you miss a dose, take it as soon as you can. If it is almost time for your next dose, take only that dose. Do not take double or extra doses. What may interact with this medication? Do not take this medication with any of the following: Certain  medications for fungal infections, such as fluconazole, itraconazole, ketoconazole, posaconazole, voriconazole Cisapride Dronedarone Linezolid MAOIs, such as Carbex, Eldepryl, Marplan, Nardil, and Parnate Mesoridazine Methylene blue (injected into a vein) Pimozide Saquinavir Thioridazine This medication may also interact with the following: Alcohol Antiviral medications for HIV or AIDS Aspirin and aspirin-like medications Barbiturates, such as phenobarbital Certain medications for blood pressure, heart disease, irregular heart beat Certain medications for mental health conditions Certain medications for migraine headache, such as almotriptan, eletriptan, frovatriptan, naratriptan, rizatriptan, sumatriptan, zolmitriptan Certain medications for seizures, such as carbamazepine and phenytoin Certain medications for sleep Certain medications that treat or prevent blood clots, such as dalteparin, enoxaparin, warfarin Digoxin Fentanyl Lithium NSAIDS, medications for pain and inflammation, such as ibuprofen or naproxen Other medications that cause heart rhythm changes Rasagiline Supplements, such as St. John's wort, kava kava, valerian Tramadol Tryptophan This list may not describe all possible interactions. Give your health care provider a list of all the medicines, herbs, non-prescription drugs, or dietary supplements you use. Also tell them if you smoke, drink alcohol, or use illegal drugs. Some items may interact with your medicine. What should I watch for while using this medication? Visit your care team for regular checks on your progress. Tell your care team if your symptoms do not start to get better or if they get worse. Because it may take several weeks to see the full effects of this medication, it is important to continue your treatment as prescribed by your care team. Watch for new or worsening thoughts of suicide or depression. This   includes sudden changes in mood, behaviors,  or thoughts. These changes can happen at any time but are more common in the beginning of treatment or after a change in dose. Call your care team right away if you experience these thoughts or worsening depression. This medication may cause mood and behavior changes, such as anxiety, nervousness, irritability, hostility, restlessness, excitability, hyperactivity, or trouble sleeping. These changes can happen at any time but are more common in the beginning of treatment or after a change in dose. Call your care team right away if you notice any of these symptoms. This medication may affect your coordination, reaction time, or judgment. Do not drive or operate machinery until you know how this medication affects you. Sit up or stand slowly to reduce the risk of dizzy or fainting spells. Drinking alcohol with this medication can increase the risk of these side effects. This medication may cause dry eyes and blurred vision. If you wear contact lenses you may feel some discomfort. Lubricating drops may help. See your care team if the problem does not go away or is severe. Your mouth may get dry. Chewing sugarless gum or sucking hard candy and drinking plenty of water may help. Contact your care team if the problem does not go away or is severe. What side effects may I notice from receiving this medication? Side effects that you should report to your care team as soon as possible: Allergic reactions--skin rash, itching, hives, swelling of the face, lips, tongue, or throat Bleeding--bloody or Theroux, tar-like stools, red or dark brown urine, vomiting blood or brown material that looks like coffee grounds, small, red or purple spots on skin, unusual bleeding or bruising Heart rhythm changes--fast or irregular heartbeat, dizziness, feeling faint or lightheaded, chest pain, trouble breathing Low blood pressure--dizziness, feeling faint or lightheaded, blurry vision Low sodium level--muscle weakness, fatigue,  dizziness, headache, confusion Prolonged or painful erection Serotonin syndrome--irritability, confusion, fast or irregular heartbeat, muscle stiffness, twitching muscles, sweating, high fever, seizures, chills, vomiting, diarrhea Sudden eye pain or change in vision such as blurry vision, seeing halos around lights, vision loss Thoughts of suicide or self-harm, worsening mood, feelings of depression Side effects that usually do not require medical attention (report to your care team if they continue or are bothersome): Change in sex drive or performance Constipation Dizziness Drowsiness Dry mouth This list may not describe all possible side effects. Call your doctor for medical advice about side effects. You may report side effects to FDA at 1-800-FDA-1088. Where should I keep my medication? Keep out of the reach of children and pets. Store at room temperature between 15 and 30 degrees C (59 to 86 degrees F). Protect from light. Keep container tightly closed. Throw away any unused medication after the expiration date. NOTE: This sheet is a summary. It may not cover all possible information. If you have questions about this medicine, talk to your doctor, pharmacist, or health care provider.  2024 Elsevier/Gold Standard (2022-05-08 00:00:00)  

## 2023-05-28 ENCOUNTER — Other Ambulatory Visit: Payer: Self-pay | Admitting: Nurse Practitioner

## 2023-05-29 ENCOUNTER — Other Ambulatory Visit: Payer: Self-pay | Admitting: Nurse Practitioner

## 2023-06-01 ENCOUNTER — Other Ambulatory Visit: Payer: Self-pay | Admitting: Nurse Practitioner

## 2023-06-02 ENCOUNTER — Encounter: Payer: Self-pay | Admitting: Internal Medicine

## 2023-06-03 ENCOUNTER — Ambulatory Visit (INDEPENDENT_AMBULATORY_CARE_PROVIDER_SITE_OTHER): Payer: BC Managed Care – PPO | Admitting: Nurse Practitioner

## 2023-06-03 VITALS — BP 146/92 | HR 81 | Temp 98.0°F | Ht 72.0 in | Wt 363.4 lb

## 2023-06-03 DIAGNOSIS — E1122 Type 2 diabetes mellitus with diabetic chronic kidney disease: Secondary | ICD-10-CM

## 2023-06-03 DIAGNOSIS — B029 Zoster without complications: Secondary | ICD-10-CM | POA: Diagnosis not present

## 2023-06-03 DIAGNOSIS — I10 Essential (primary) hypertension: Secondary | ICD-10-CM | POA: Diagnosis not present

## 2023-06-03 DIAGNOSIS — N181 Chronic kidney disease, stage 1: Secondary | ICD-10-CM

## 2023-06-03 MED ORDER — VALACYCLOVIR HCL 1 G PO TABS: 1000.0000 mg | ORAL_TABLET | Freq: Three times a day (TID) | ORAL | 0 refills | Status: DC

## 2023-06-03 NOTE — Progress Notes (Signed)
 Assessment and Plan:  Mark Harrell. was seen today for an episodic visit.  Diagnoses and all order for this visit:  1. Herpes zoster without complication (Primary) Start tmt with Valtrex  Keep your rash clean and dry.  Do not use creams or gels. Try not to scratch your skin. Might help to cover it with a clean dressing. Wear loose clothing if this makes you more comfortable.  Continue to monitor for increase in worsening infection, may experience flu like symptoms. Contact office if s/s fail to improve.  - valACYclovir  (VALTREX ) 1000 MG tablet; Take 1 tablet (1,000 mg total) by mouth 3 (three) times daily. Take for 7 days  Dispense: 21 tablet; Refill: 0  2. Essential hypertension Discussed DASH (Dietary Approaches to Stop Hypertension) DASH diet is lower in sodium than a typical American diet. Cut back on foods that are high in saturated fat, cholesterol, and trans fats. Eat more whole-grain foods, fish, poultry, and nuts Remain active and exercise as tolerated daily.  Monitor BP at home-Call if greater than 130/80.  Check CMP/CBC  3. Type 2 diabetes mellitus with stage 1 chronic kidney disease, without long-term current use of insulin  [facility]) Education: Reviewed 'ABCs' of diabetes management  Discussed goals to be met and/or maintained include [name] Blood pressure (<130/80) Cholesterol ([name]) Continue Eye Exam yearly  Continue Dental Exam Q6 mo Discussed dietary recommendations Discussed Physical Activity recommendations  Notify office for further evaluation and treatment, questions or concerns if s/s fail to improve. The risks and benefits of my recommendations, as well as other treatment options were discussed with the patient today. Questions were answered.  Further disposition pending results of labs. Discussed med's effects and SE's.    Over 20 minutes of exam, counseling, chart review, and critical decision making was performed.   Future Appointments  Date  Time Provider Department Center  06/18/2023  3:00 PM Sherryl Bouchard, NP [facility] None  07/14/2023  4:00 PM Iva Marty Saltness, MD [name]  07/28/2023  4:15 PM [place], Max T, DPM [name]  07/30/2023  4:00 PM Laurice President, NP [name]  05/23/2024  3:00 PM Tonita Fallow, MD [name]    ------------------------------------------------------------------------------------------------------------------   HPI BP (!) 146/92   Pulse 81   Temp 98 F (36.7 C)   Ht 6' (1.829 m)   Wt (!) 363 lb 6.4 oz (164.8 kg)   SpO2 99%   BMI 49.29 kg/m   Patient reports to office with c/o vesicular blisters to left inner thigh and knee area.  Onset x3 days ago.  Associated itching, tingling sensation.  Denies fever, chills, N/V, recent URI.  No known contacts with new soaps or lotions, detergents, clothing, environmental agents.    BP continues to remain elevated.  He reports continued compliance with medications. Last BP readings include:  BP Readings from Last 3 Encounters:  06/03/23 (!) 146/92  04/29/23 (!) 138/92  04/14/23 (!) 138/96   A1c has been well controlled on medications:  Lab Results  Component Value Date   HGBA1C 5.3 04/29/2023   Past Medical History:  Diagnosis Date   Anesthesia complication requiring reversal agent administration    ? from central apnea, very difficult to get off vent   Anxiety    Arthritis    osteo   Asthma    BPH (benign prostatic hyperplasia)    Complication of anesthesia    difficulty waking , they twlight me because of my respiratory problems    COPD (chronic  obstructive pulmonary disease) (HCC)    COVID    moderate case 2023   Depression    DVT (deep venous thrombosis) (HCC) 2023   right leg   Dyspnea    on exertion   Enlarged heart    Family history of adverse reaction to anesthesia    mother trouble waking up, and heart stopped   GERD (gastroesophageal reflux disease)    Headache    botox  injections for  headaches   Hyperlipidemia    Hypertension    Hypogonadism male    IBS (irritable bowel syndrome)    Memory difficulties    short term memories   Neuropathy    Obesity    On home oxygen  therapy    on 2 liter   OSA (obstructive sleep apnea)    not using cpap   Paralysis (HCC)    left hand - small    due to accident with an head injury   Pneumonia    Pre-diabetes    Prostatitis      Allergies  Allergen Reactions   Bee Venom Swelling   Cymbalta [Duloxetine Hcl] Shortness Of Breath    Brought on asthma   Ppd [Tuberculin Purified Protein Derivative] Other (See Comments)    +ppd NEG Quantferron Gold 3/13 (shows false positive)    Calan [Verapamil] Other (See Comments)    Back pain   Tricor [Fenofibrate] Other (See Comments)    Back pain   Claritin  [Loratadine ] Other (See Comments)    Unknown reaction    Levaquin  [Levofloxacin ] Diarrhea    Current Outpatient Medications on File Prior to Visit  Medication Sig   acetaminophen  (TYLENOL ) 500 MG tablet Take 1,000 mg by mouth 3 (three) times daily.   albuterol  (VENTOLIN  HFA) 108 (90 Base) MCG/ACT inhaler Inhale 1-2 puffs into the lungs every 6 (six) hours as needed for wheezing or shortness of breath.   anastrozole  (ARIMIDEX ) 1 MG tablet Take 1 mg by mouth daily.   ascorbic acid  (VITAMIN C) 1000 MG tablet Take 1,000 mg by mouth every evening.   aspirin  EC 81 MG tablet Take 81 mg by mouth daily. Swallow whole.   Azelastine  HCl 137 MCG/SPRAY SOLN Place 2 sprays into the nose 2 (two) times daily as needed (allergies).   benzonatate  (TESSALON ) 200 MG capsule Take 1 capsule 3 x /day to Prevent Cough                                                     /                                  TAKE                               BY                              MOUTH   botulinum toxin Type A  (BOTOX ) 200 units injection Provider to inject 155 units into the muscles of the head and neck every 12 weeks. Discard remainder.   budesonide  (PULMICORT ) 0.5  MG/2ML nebulizer solution INHALE CONTENTS OF 1 VIAL VIA NEBULIZER 2 (TWO) TIMES DAILY  AS NEEDED.   Calcium  Carb-Cholecalciferol  (CALCIUM  + VITAMIN D3 PO) Take 1 tablet by mouth daily.   celecoxib  (CELEBREX ) 200 MG capsule TAKE 1 CAPSULE (200 MG TOTAL) BY MOUTH EVERY 12 (TWELVE) HOURS.   cetaphil (CETAPHIL) lotion Apply 1 Application topically 2 (two) times daily.   cetirizine  (ZYRTEC ) 10 MG tablet Take 1 tablet (10 mg total) by mouth daily as needed for allergies.   Cholecalciferol  (VITAMIN D3) 125 MCG (5000 UT) TABS Take 5,000-10,000 Units by mouth See admin instructions. Take 10,000 in the morning and 5,000 units in the evening   Cinnamon  500 MG capsule Take 1,000 mg by mouth 3 (three) times daily.   Coenzyme Q10 (CO Q 10 PO) Take 600 mg by mouth daily.   cyclobenzaprine  (FLEXERIL ) 10 MG tablet TAKE 1 TABLET 3 TIMES A DAY ONLY IF NEEDED FOR MUSCLE SPASM (NECK)   Dextromethorphan HBr (DELSYM PO) Take by mouth.   diclofenac  Sodium (VOLTAREN ) 1 % GEL Apply 2-4 g topically 2 (two) times daily as needed (pain).   DM-APAP-CPM (CORICIDIN HBP PO) Take by mouth.   donepezil  (ARICEPT ) 23 MG TABS tablet TAKE 1 TABLET DAILY FOR MEMORY   EPINEPHrine  0.3 mg/0.3 mL IJ SOAJ injection Inject 0.3 mg into the muscle as needed for anaphylaxis.   furosemide  (LASIX ) 80 MG tablet Take 80 mg by mouth daily as needed for edema.   Guaifenesin  1200 MG TB12 Take 1,200 mg by mouth 3 (three) times daily.   ipratropium (ATROVENT ) 0.03 % nasal spray Place 2 sprays into the nose 3 (three) times daily as needed for rhinitis.   ipratropium-albuterol  (DUONEB) 0.5-2.5 (3) MG/3ML SOLN Take 3 mLs by nebulization 2 (two) times daily as needed.   KLOR-CON  M20 20 MEQ tablet TAKE 1 TABLET TWICE DAILY FOR POTASSIUM   Krill Oil 500 MG CAPS Take 500 mg by mouth 2 (two) times daily.   losartan  (COZAAR ) 100 MG tablet Take 1 tablet (100 mg total) by mouth daily.   Magnesium  500 MG CAPS Take 500 mg by mouth 3 (three) times daily.   memantine   (NAMENDA ) 10 MG tablet Take 1 tablet (10 mg total) by mouth 2 (two) times daily.   metFORMIN  (GLUCOPHAGE -XR) 500 MG 24 hr tablet TAKES 2 TABLETS TWICE A DAY WITH MEALS FOR DIABETES   metoprolol  tartrate (LOPRESSOR ) 25 MG tablet Take 1 tablet (25 mg total) by mouth in the morning, at noon, and at bedtime.   Misc Natural Products (GLUCOS-CHONDROIT-MSM COMPLEX) TABS Take 2 tablets by mouth 3 (three) times daily.   montelukast  (SINGULAIR ) 10 MG tablet Take 1 tablet (10 mg total) by mouth at bedtime.   Multiple Vitamins-Minerals (MULTIVITAMIN WITH MINERALS) tablet Take 1 tablet by mouth every morning.   Multiple Vitamins-Minerals (PRESERVISION AREDS 2) CAPS Take 1 capsule by mouth 2 (two) times daily. Evening and night   oxymetazoline  (AFRIN) 0.05 % nasal spray Place 1 spray into both nostrils at bedtime.   pentosan polysulfate (ELMIRON ) 100 MG capsule Take 100 mg by mouth 3 (three) times daily.   pravastatin  (PRAVACHOL ) 40 MG tablet TAKE 1 TABLET BY MOUTH EVERY DAY AT BEDTIME FOR CHOLESTEROL   pregabalin  (LYRICA ) 150 MG capsule TAKE 1 CAPSULE 3 TIMES A DAY FOR CHRONIC PAIN   promethazine -dextromethorphan (PROMETHAZINE -DM) 6.25-15 MG/5ML syrup TAKE 5 MLS BY MOUTH 4 TIMES A DAY AS NEEDED FOR COUGH   sertraline  (ZOLOFT ) 100 MG tablet TAKE 2 TABLETS DAILY FOR MOOD / DEPRESSION   sodium chloride  (OCEAN) 0.65 % SOLN nasal spray Place 1 spray  into both nostrils 4 (four) times daily as needed for congestion. Uses each time before other nasal sprays   tadalafil  (CIALIS ) 5 MG tablet Take 5 mg by mouth daily.    tirzepatide  (MOUNJARO ) 2.5 MG/0.5ML Pen Inject 2.5 mg into the skin once a week.   traZODone  (DESYREL ) 50 MG tablet TAKE 1 TABLET BY MOUTH EVERYDAY AT BEDTIME   TRELEGY ELLIPTA  100-62.5-25 MCG/ACT AEPB Inhale 1 puff into the lungs daily.   trospium (SANCTURA) 20 MG tablet Take 20 mg by mouth 2 (two) times daily. In the evening and at bedtime   TURMERIC PO Take 2,000 mg by mouth 3 (three) times daily.  1000 mg each   zinc  gluconate 50 MG tablet Take 50 mg by mouth 3 (three) times daily.   amoxicillin -clavulanate (AUGMENTIN ) 875-125 MG tablet Take 1 tablet by mouth 2 (two) times daily.   ciprofloxacin  (CIPRO ) 250 MG tablet TAKE 1 TABLET 2 X /DAY WITH FOOD FOR INFECTION   No current facility-administered medications on file prior to visit.    ROS: all negative except what is noted in the HPI.   Physical Exam:  BP (!) 146/92   Pulse 81   Temp 98 F (36.7 C)   Ht 6' (1.829 m)   Wt (!) 363 lb 6.4 oz (164.8 kg)   SpO2 99%   BMI 49.29 kg/m   General Appearance: NAD.  Awake, conversant and cooperative. Eyes: PERRLA, EOMs intact.  Sclera white.  Conjunctiva without erythema. Sinuses: No frontal/maxillary tenderness.  No nasal discharge. Nares patent.  ENT/Mouth: Ext aud canals clear.  Bilateral TMs w/DOL and without erythema or bulging. Hearing intact.  Posterior pharynx without swelling or exudate.  Tonsils without swelling or erythema.  Neck: Supple.  No masses, nodules or thyromegaly. Respiratory: Effort is regular with non-labored breathing. Breath sounds are equal bilaterally without rales, rhonchi, wheezing or stridor.  Cardio: RRR with no MRGs. Brisk peripheral pulses without edema.  Abdomen: Active BS in all four quadrants.  Soft and non-tender without guarding, rebound tenderness, hernias or masses. Lymphatics: Non tender without lymphadenopathy.  Musculoskeletal: Full ROM, 5/5 strength, normal ambulation.  No clubbing or cyanosis. Skin: Scattered vesicle type rash with underlying erythema located on left inner thigh and medial knee.  Warm.  Appropriate color for ethnicity. Neuro: CN II-XII grossly normal. Normal muscle tone without cerebellar symptoms and intact sensation.   Psych: AO X 3,  appropriate mood and affect, insight and judgment.     BASCOM NECESSARY, NP 4:14 PM [facility]

## 2023-06-04 NOTE — Patient Instructions (Signed)
Shingles  Shingles is an infection. It gives you a painful skin rash and blisters that have fluid in them. Shingles is caused by the same germ (virus) that causes chickenpox. Shingles only happens in people who: Have had chickenpox. Have been given a shot (vaccine) to protect against chickenpox. Shingles is rare in this group. What are the causes? This condition is caused by varicella-zoster virus. This is the same germ that causes chickenpox. After a person is exposed to the germ, the germ stays in the body but is not active (dormant). Shingles develops if the germ becomes active again (is reactivated). This can happen many years after the first exposure to the germ. It is not known what causes this germ to become active again. What increases the risk? People who have had chickenpox or received the chickenpox shot are at risk for shingles. This infection is more common in people who: Are older than 63 years of age. Have a weakened disease-fighting system (immune system), such as people with: HIV (human immunodeficiency virus). AIDS (acquired immunodeficiency syndrome). Cancer. Are taking medicines that weaken the immune system, such as organ transplant medicines. Have a lot of stress. What are the signs or symptoms? The first symptoms of shingles may be itching, tingling, or pain in an area on your skin. A rash will show on your skin a few days or weeks later. This is what usually happens: The rash is likely to be on one side of your body. The rash usually has a shape like a belt or a band. Over time, the rash turns into fluid-filled blisters. The blisters will break open and change into scabs. The scabs usually dry up in about 2-3 weeks. You may also have: A fever. Chills. A headache. A feeling like you may vomit (nausea). How is this treated? The rash may last for several weeks. There is not a specific cure for this condition. Your doctor may prescribe medicines. Medicines  may: Help with pain. Help you get better sooner. Help to prevent long-term problems. Help with itching (antihistamines). If the area involved is on your face, you may need to see a specialist. This may be an eye doctor or an ear, nose, and throat (ENT) doctor. Follow these instructions at home: Medicines Take over-the-counter and prescription medicines only as told by your doctor. Put on an anti-itch cream or numbing cream where you have a rash, blisters, or scabs. Do this as told by your doctor. Helping with itching and discomfort  Put cold, wet cloths (cold compresses) on the area of the rash or blisters as told by your doctor. Cool baths can help you feel better. Try adding baking soda or dry oatmeal to the water to lessen itching. Do not bathe in hot water. Use calamine lotion as told by your doctor. Blister and rash care Keep your rash covered with a loose bandage (dressing). Wear loose clothing that does not rub on your rash. Wash your hands with soap and water for at least 20 seconds before and after you change your bandage. If you cannot use soap and water, use hand sanitizer. Change your bandage as told by your doctor. Keep your rash and blisters clean. To do this, wash the area with mild soap and cool water as told by your doctor. Check your rash every day for signs of infection. Check for: More redness, swelling, or pain. Fluid or blood. Warmth. Pus or a bad smell. Do not scratch your rash. Do not pick at your blisters. To   help you to not scratch: Keep your fingernails clean and cut short. Wear gloves or mittens when you sleep, if scratching is a problem. General instructions Rest as told by your doctor. Wash your hands often with soap and water for at least 20 seconds. If you cannot use soap and water, use hand sanitizer. Doing this lowers your chance of getting a skin infection. Your infection can cause chickenpox in people who have never had chickenpox or never got a  chickenpox vaccine shot. If you have blisters that did not change into scabs yet, try not to touch other people or be around other people, especially: Babies. Pregnant women. Children who have areas of red, itchy, or rough skin (eczema). Older people who have organ transplants. People who have a long-term (chronic) illness, like cancer or AIDS. Keep all follow-up visits. How is this prevented? A vaccine shot is the best way to prevent shingles and protect against shingles problems. If you have not had a vaccine shot, talk with your doctor about getting it. Where to find more information Centers for Disease Control and Prevention: www.cdc.gov Contact a doctor if: Your pain does not get better with medicine. Your pain does not get better after the rash heals. You have any of these signs of infection around the rash: More redness, swelling, or pain. Fluid or blood. Warmth. Pus or a bad smell. You have a fever. Get help right away if: The rash is on your face or nose. You have pain in your face or pain by your eye. You lose feeling on one side of your face. You have trouble seeing. You have ear pain, or you have ringing in your ear. You have a loss of taste. Your condition gets worse. Summary Shingles gives you a painful skin rash and blisters that have fluid in them. Shingles is caused by the same germ (virus) that causes chickenpox. Keep your rash covered with a loose bandage. Wear loose clothing that does not rub on your rash. If you have blisters that did not change into scabs yet, try not to touch other people or be around people. This information is not intended to replace advice given to you by your health care provider. Make sure you discuss any questions you have with your health care provider. Document Revised: 05/07/2020 Document Reviewed: 05/07/2020 Elsevier Patient Education  2024 Elsevier Inc.  

## 2023-06-12 ENCOUNTER — Encounter: Payer: Self-pay | Admitting: Nurse Practitioner

## 2023-06-18 ENCOUNTER — Ambulatory Visit: Payer: BC Managed Care – PPO | Admitting: Adult Health

## 2023-06-18 DIAGNOSIS — G43709 Chronic migraine without aura, not intractable, without status migrainosus: Secondary | ICD-10-CM

## 2023-06-18 MED ORDER — ONABOTULINUMTOXINA 200 UNITS IJ SOLR
155.0000 [IU] | Freq: Once | INTRAMUSCULAR | Status: AC
  Administered 2023-06-18: 155 [IU] via INTRAMUSCULAR

## 2023-06-18 NOTE — Progress Notes (Signed)
06/18/23: Botox continues to work well. Headaches tend to be located in he occipital region and shoulder. Increase in the last couple of weeks since botox is due. No new medical history or medications.    03/16/23: botox is working well. Reports on and off neck pain. Reports that orthopedist said that botox may help.  12/22/22: First botox with our office. When getting botox through [facility] it was beneficial for his migraines.   BOTOX PROCEDURE NOTE FOR MIGRAINE HEADACHE    Contraindications and precautions discussed with patient(above). Aseptic procedure was observed and patient tolerated procedure. Procedure performed by Butch Penny, NP  The condition has existed for more than 6 months, and pt does not have a diagnosis of ALS, Myasthenia Gravis or Lambert-Eaton Syndrome.  Risks and benefits of injections discussed and pt agrees to proceed with the procedure.  Written consent obtained. Reviewed potential side effects.     Indication/Diagnosis: chronic migraine BOTOX(J0585) injection was performed according to protocol by [employer]. 200 units of BOTOX was dissolved into 4 cc NS.   NDC: [address]-78  Type of toxin: Botox  Botox- 200 units x 1 vial Lot: [id number] Expiration: 07/2025 NDC: 2956-2130-86   Bacteriostatic 0.9% Sodium Chloride- * mL  Lot: [id number] Expiration: 03/26/2024 NDC: 6295-2841-32   Dx: 43.709     Description of procedure:  The patient was placed in a sitting position. The standard protocol was used for Botox as follows, with 5 units of Botox injected at each site:   -Procerus muscle, midline injection  -Corrugator muscle, bilateral injection  -Frontalis muscle, bilateral injection, with 2 sites each side, medial injection was performed in the upper one third of the frontalis muscle, in the region vertical from the medial inferior edge of the superior orbital rim. The lateral injection was again in the upper one third of the forehead vertically above the  lateral limbus of the cornea, 1.5 cm lateral to the medial injection site.  -Temporalis muscle injection, 4 sites, bilaterally. The first injection was 3 cm above the tragus of the ear, second injection site was 1.5 cm to 3 cm up from the first injection site in line with the tragus of the ear. The third injection site was 1.5-3 cm forward between the first 2 injection sites. The fourth injection site was 1.5 cm posterior to the second injection site.  -Occipitalis muscle injection, 3 sites, bilaterally. The first injection was done one half way between the occipital protuberance and the tip of the mastoid process behind the ear. The second injection site was done lateral and superior to the first, 1 fingerbreadth from the first injection. The third injection site was 1 fingerbreadth superiorly and medially from the first injection site.  -Cervical paraspinal muscle injection, 2 sites, bilateral knee first injection site was 1 cm from the midline of the cervical spine, 3 cm inferior to the lower border of the occipital protuberance. The second injection site was 1.5 cm superiorly and laterally to the first injection site.  -Trapezius muscle injection was performed at 3 sites, bilaterally. The first injection site was in the upper trapezius muscle halfway between the inflection point of the neck, and the acromion. The second injection site was one half way between the acromion and the first injection site. The third injection was done between the first injection site and the inflection point of the neck.   Will return for repeat injection in 3 months.   A 200 units of Botox was used, 155 units were injected, the rest  of the Botox was wasted. The patient tolerated the procedure well, there were no complications of the above procedure.  Butch Penny, MSN, NP-C 06/18/2023, 2:51 PM [facility] [address] [place], [place] [address] [phone]

## 2023-06-18 NOTE — Progress Notes (Signed)
Botox- 200 units x 1 vial Lot: [id number] Expiration: 07/2025 NDC: 1610-9604-54  Bacteriostatic 0.9% Sodium Chloride- * mL  Lot: [id number] Expiration: 03/26/2024 NDC: 1478-2956-21  Dx: 43.709 B/B Witnessed by Alverda Skeans, RN

## 2023-06-24 ENCOUNTER — Other Ambulatory Visit: Payer: Self-pay | Admitting: Nurse Practitioner

## 2023-07-06 ENCOUNTER — Other Ambulatory Visit: Payer: Self-pay | Admitting: Podiatry

## 2023-07-06 DIAGNOSIS — B351 Tinea unguium: Secondary | ICD-10-CM

## 2023-07-14 ENCOUNTER — Encounter: Payer: Self-pay | Admitting: Allergy & Immunology

## 2023-07-14 ENCOUNTER — Other Ambulatory Visit: Payer: Self-pay

## 2023-07-14 ENCOUNTER — Ambulatory Visit: Payer: BC Managed Care – PPO | Admitting: Allergy & Immunology

## 2023-07-14 DIAGNOSIS — K219 Gastro-esophageal reflux disease without esophagitis: Secondary | ICD-10-CM | POA: Diagnosis not present

## 2023-07-14 DIAGNOSIS — J3089 Other allergic rhinitis: Secondary | ICD-10-CM | POA: Diagnosis not present

## 2023-07-14 DIAGNOSIS — B999 Unspecified infectious disease: Secondary | ICD-10-CM | POA: Diagnosis not present

## 2023-07-14 DIAGNOSIS — J302 Other seasonal allergic rhinitis: Secondary | ICD-10-CM

## 2023-07-14 DIAGNOSIS — J4489 Other specified chronic obstructive pulmonary disease: Secondary | ICD-10-CM | POA: Diagnosis not present

## 2023-07-14 MED ORDER — TRIAMCINOLONE ACETONIDE 0.1 % EX OINT: 1.0000 | TOPICAL_OINTMENT | Freq: Two times a day (BID) | CUTANEOUS | 0 refills | Status: DC

## 2023-07-14 MED ORDER — OLOPATADINE HCL 0.6 % NA SOLN: 2.0000 | Freq: Two times a day (BID) | NASAL | 5 refills | Status: DC | PRN

## 2023-07-14 MED ORDER — ALBUTEROL SULFATE HFA 108 (90 BASE) MCG/ACT IN AERS
1.0000 | INHALATION_SPRAY | Freq: Four times a day (QID) | RESPIRATORY_TRACT | 1 refills | Status: DC | PRN
Start: 2023-07-14 — End: 2024-01-21

## 2023-07-14 MED ORDER — CETIRIZINE HCL 10 MG PO TABS: 10.0000 mg | ORAL_TABLET | Freq: Every day | ORAL | 1 refills | Status: DC | PRN

## 2023-07-14 MED ORDER — MONTELUKAST SODIUM 10 MG PO TABS: 10.0000 mg | ORAL_TABLET | Freq: Every day | ORAL | 1 refills | Status: DC

## 2023-07-14 MED ORDER — TRELEGY ELLIPTA 100-62.5-25 MCG/ACT IN AEPB: 1.0000 | INHALATION_SPRAY | Freq: Every day | RESPIRATORY_TRACT | 5 refills | Status: DC

## 2023-07-14 NOTE — Patient Instructions (Addendum)
1. 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 - Let's hold off on the immunoglobulin replacement and restart the Dupixent instead.   2. Asthma-COPD overlap syndrome - Lung testing is getting lower, so I think we need to get back on Dupixent.  - I will talk to Tammy about getting that on board.  - Daily controller medication(s): Trelegy one puff once daily and Singulair 10mg  daily - Prior to physical activity: albuterol 2 puffs 10-15 minutes before physical activity - Rescue medications: albuterol 1-2 puffs every 4-6 hours as needed or DuoNeb nebulizer one vial every 4-6 hours as needed - Asthma control goals:  * Full participation in all desired activities (may need albuterol before activity) * Albuterol use two time or less a week on average (not counting use with activity) * Cough interfering with sleep two time or less a month * Oral steroids no more than once a year * No hospitalizations   3. Allergic rhinitis (sweet vernal grass, box elder, cat, weeds, ragweed, molds, cockroach, dust mite) - Continue with saline mist 1-2 times daily.  - Stop the Azelastine and start Patanase two sprays per nostril twice daily. - Continue with Ipratropium one spray per nostril every 8 hours as needed for runny noses.  - Continue with Cetirizine 10mg  daily as needed for runny nose, sneezing, itchy watery eyes.  - Continue with Singulair/Montelukast 10mg  in the evening.   4. GERD - Continue with heartburn medications as needed.   5. Return in about 3 months (around 10/11/2023). You can have the follow up appointment with Dr. Dellis Anes or a Nurse Practicioner (our Nurse Practitioners are excellent and always have Physician oversight!).    Please inform us of any Emergency Department visits, hospitalizations, or changes in  symptoms. Call us before going to the ED for breathing or allergy symptoms since we might be able to fit you in for a sick visit. Feel free to contact us anytime with any questions, problems, or concerns.  It was a pleasure to see you again today!  Websites that have reliable patient information: 1. American Academy of Asthma, Allergy, and Immunology: www.aaaai.org 2. Food Allergy Research and Education (FARE): foodallergy.org 3. Mothers of Asthmatics: http://www.asthmacommunitynetwork.org 4. American College of Allergy, Asthma, and Immunology: www.acaai.org      "Like" [place] on Facebook and Instagram for our latest updates!      A healthy democracy works best when [employer] participate! Make sure you are registered to vote! If you have moved or changed any of your contact information, you will need to get this updated before voting! Scan the QR codes below to learn more!

## 2023-07-14 NOTE — Progress Notes (Unsigned)
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 - with several breakthrough infections as of late with multiple hospitalizations (has been on doxycycline for a few years, but we may need to consider imunoglobulin supplementation   Asthma-COPD overlap syndrome with worsening pulmonary status since contracting COVID pneumonia in January 2023   Previously doing well on Nucala and then Dupixent (stopped January 2023 after his COVID PNA)   Bilateral pulmonary nodules - benign biopsies in December 2023   Hymenoptera allergy - EpiPen up to date   GERD - on PRN H2 blocker    Obesity - with resulting nerve impingement and joint pain   Chronic back pain - s/p recent neck surgery   Migraines - on botulinum injections   Polypharmacy   Normal echocardiogram (May 2023)   Recent passing of his mother  Plan/Recommendations:   Assessment and Plan              Patient Instructions  1. 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 - Let's hold off on the immunoglobulin replacement and restart the Dupixent instead.   2. Asthma-COPD overlap syndrome - Lung testing is getting lower, so I think we need to get back on Dupixent.  - I will talk to Tammy about getting that on board.  - Daily controller medication(s): Trelegy one puff once daily and Singulair 10mg  daily - Prior to physical activity: albuterol 2 puffs 10-15 minutes before physical activity - Rescue medications: albuterol 1-2 puffs every 4-6 hours as needed or DuoNeb nebulizer one vial every 4-6 hours as needed - Asthma control goals:  * Full participation  in all desired activities (may need albuterol before activity) * Albuterol use two time or less a week on average (not counting use with activity) * Cough interfering with sleep two time or less a month * Oral steroids no more than once a year * No hospitalizations   3. Allergic rhinitis (sweet vernal grass, box elder, cat, weeds, ragweed, molds, cockroach, dust mite) - Continue with saline mist 1-2 times daily.  - Stop the Azelastine and start Patanase two sprays per nostril twice daily. - Continue with Ipratropium one spray per nostril every 8 hours as needed for runny noses.  - Continue with Cetirizine 10mg  daily as needed for runny nose, sneezing, itchy watery eyes.  - Continue with Singulair/Montelukast 10mg  in the evening.   4. GERD - Continue with heartburn medications as needed.   5. Return in about 3 months (around 10/11/2023). You can have the follow up appointment with Dr. Dellis Anes or a Nurse Practicioner (our Nurse Practitioners are excellent and always have Physician oversight!).    Please inform us of any Emergency Department visits, hospitalizations, or changes in symptoms. Call us before going to the ED for breathing or allergy symptoms since we might be able to fit you in for a sick visit. Feel free to contact us anytime with any questions, problems, or concerns.  It was a pleasure to see you again today!  Websites that have reliable patient information: 1. American Academy of Asthma, Allergy, and Immunology: www.aaaai.org 2. Food Allergy Research and Education (FARE): foodallergy.org 3. Mothers  of Asthmatics: http://www.asthmacommunitynetwork.org 4. American College of Allergy, Asthma, and Immunology: www.acaai.org      "Like" [place] on Facebook and Instagram for our latest updates!      A healthy democracy works best when [employer] participate! Make sure you are registered to vote! If you have moved or changed any of your contact information, you will need to get  this updated before voting! Scan the QR codes below to learn more!             Subjective:   Mark Harrell. is a 63 y.o. male presenting today for follow up of  Chief Complaint  Patient presents with   Breathing Problem    Mark Harrell. has a history of the following: Patient Active Problem List   Diagnosis Date Noted   Unilateral primary osteoarthritis, left knee 02/24/2022   Unilateral primary osteoarthritis, right knee 02/24/2022   Abnormal CT of the chest 01/28/2022   Hypokalemia 09/26/2021   Dyspnea 09/25/2021   Acute deep vein thrombosis (DVT) of right lower extremity (HCC) 09/24/2021   OSA (obstructive sleep apnea) 09/24/2021   Herniation of cervical intervertebral disc with radiculopathy    Status post cervical discectomy 08/20/2020   Tracheomalacia 05/26/2019   Acquired tracheomalacia 05/26/2019   HNP (herniated nucleus pulposus) with myelopathy, cervical 05/23/2019    Class: Chronic   Spinal stenosis of cervical region 05/23/2019    Class: Chronic   Status post cervical spinal fusion 05/23/2019   Chronic respiratory failure with hypoxia (HCC) 04/12/2018   Bilateral lower extremity edema 12/29/2017   Hymenoptera allergy 11/10/2017   Family history of colonic polyps 08/07/2017   Myofascial pain 08/06/2017   Seasonal and perennial allergic rhinitis 04/23/2017   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/2017   Cervicalgia 04/01/2017   Recurrent pneumonia 03/09/2017   Other spondylosis with radiculopathy, cervical region 02/09/2017   Depression with anxiety 01/04/2017   Memory difficulty 12/05/2016   Chronic migraine without aura without status migrainosus, not intractable 10/30/2016   Lumbar radiculopathy 05/15/2016   Osteoarthritis of spine with radiculopathy, lumbar region 05/15/2016   Risk for falls 05/15/2016   Recurrent infections 03/29/2016   Chronic nonseasonal allergic rhinitis due to pollen 03/29/2016   Polypharmacy 01/16/2016   Morbid obesity with BMI of  45.0-49.9, adult (HCC) 03/06/2015   SDAT 02/05/2015   OSA and COPD overlap syndrome (HCC) 02/05/2015   Medication management 08/02/2014   GERD (gastroesophageal reflux disease) 05/09/2014   Vitamin D deficiency 08/01/2013   Prediabetes 08/01/2013   Positive TB test 07/29/2011   Diverticula of colon 05/07/2011   Hypertension 01/31/2011   Hyperlipidemia, mixed 01/31/2011   BPH (benign prostatic hyperplasia) 01/31/2011   Testosterone Deficiency 01/31/2011   IBS (irritable bowel syndrome) 01/31/2011   Partial complex seizure disorder with intractable epilepsy (HCC) 01/31/2011   Depression, major, recurrent, in partial remission (HCC) 01/31/2011   Asthma-COPD overlap syndrome (HCC) 01/31/2011    History obtained from: chart review and {Persons; PED relatives w/patient:19415::"patient"}.  Discussed the use of AI scribe software for clinical note transcription with the patient and/or guardian, who gave verbal consent to proceed.  Yassine is a 63 y.o. male presenting for {Blank single:19197::"a food challenge","a drug challenge","skin testing","a sick visit","an evaluation of ***","a follow up visit"}.  Discussed the use of AI scribe software for clinical note transcription with the patient, who gave verbal consent to proceed.  History of Present Illness            Asthma/Respiratory Symptom History: ***  Allergic  Rhinitis Symptom History: ***  He has had two weeks of congestion. He has not had a fever that he is aware of. He is on the ipratropium. Astelin was no longer covered. He uses Afrin at night, nearly nightly. He sometimes will use the CPAP. He is on cetirizine and the montelukas.t triamcin  Food Allergy Symptom History: ***  Skin Symptom History: ***  GERD Symptom History: ***  Infection Symptom History: ***  Otherwise, there have been no changes to his past medical history, surgical history, family history, or social history.    Review of systems otherwise negative  other than that mentioned in the HPI.    Objective:   There were no vitals taken for this visit. There is no height or weight on file to calculate BMI.    Physical Exam   Diagnostic studies:    Spirometry: results abnormal (FEV1: 1.92/52%, FVC: 3.05/63%, FEV1/FVC: 63%).    Spirometry consistent with mixed obstructive and restrictive disease. {Blank single:19197::"Albuterol/Atrovent nebulizer","Xopenex/Atrovent nebulizer","Albuterol nebulizer","Albuterol four puffs via MDI","Xopenex four puffs via MDI"} treatment given in clinic with {Blank single:19197::"significant improvement in FEV1 per ATS criteria","significant improvement in FVC per ATS criteria","significant improvement in FEV1 and FVC per ATS criteria","improvement in FEV1, but not significant per ATS criteria","improvement in FVC, but not significant per ATS criteria","improvement in FEV1 and FVC, but not significant per ATS criteria","no improvement"}.  Allergy Studies: {Blank single:19197::"none","deferred due to recent antihistamine use","deferred due to insurance stipulations that require a separate visit for testing","labs sent instead"," "}    {Blank single:19197::"Allergy testing results were read and interpreted by myself, documented by clinical staff."," "}      Malachi Bonds, MD  [facility]

## 2023-07-15 ENCOUNTER — Encounter: Payer: Self-pay | Admitting: Allergy & Immunology

## 2023-07-27 ENCOUNTER — Encounter: Payer: Self-pay | Admitting: *Deleted

## 2023-07-27 ENCOUNTER — Telehealth: Payer: Self-pay | Admitting: *Deleted

## 2023-07-27 NOTE — Telephone Encounter (Signed)
 Tried to reach patient but voicemail full sent Mychart message to patient regarding Dupixent approval and submit

## 2023-07-27 NOTE — Telephone Encounter (Signed)
-----   Message from Alfonse Spruce sent at 07/15/2023  8:59 AM EST ----- Wants to restart Dupixent.

## 2023-07-28 ENCOUNTER — Other Ambulatory Visit: Payer: Self-pay

## 2023-07-28 ENCOUNTER — Ambulatory Visit: Payer: BC Managed Care – PPO | Admitting: Podiatry

## 2023-07-28 ENCOUNTER — Encounter: Payer: Self-pay | Admitting: Podiatry

## 2023-07-28 ENCOUNTER — Other Ambulatory Visit (HOSPITAL_COMMUNITY): Payer: Self-pay

## 2023-07-28 DIAGNOSIS — M722 Plantar fascial fibromatosis: Secondary | ICD-10-CM

## 2023-07-28 DIAGNOSIS — M79676 Pain in unspecified toe(s): Secondary | ICD-10-CM

## 2023-07-28 DIAGNOSIS — B351 Tinea unguium: Secondary | ICD-10-CM | POA: Diagnosis not present

## 2023-07-28 DIAGNOSIS — M7752 Other enthesopathy of left foot: Secondary | ICD-10-CM

## 2023-07-28 DIAGNOSIS — D2372 Other benign neoplasm of skin of left lower limb, including hip: Secondary | ICD-10-CM

## 2023-07-28 DIAGNOSIS — D2371 Other benign neoplasm of skin of right lower limb, including hip: Secondary | ICD-10-CM | POA: Diagnosis not present

## 2023-07-28 DIAGNOSIS — M7751 Other enthesopathy of right foot: Secondary | ICD-10-CM | POA: Diagnosis not present

## 2023-07-28 MED ORDER — TRIAMCINOLONE ACETONIDE 40 MG/ML IJ SUSP
40.0000 mg | Freq: Once | INTRAMUSCULAR | Status: AC
  Administered 2023-07-28: 40 mg

## 2023-07-28 MED ORDER — DUPIXENT 300 MG/2ML ~~LOC~~ SOSY
600.0000 mg | PREFILLED_SYRINGE | Freq: Once | SUBCUTANEOUS | 11 refills | Status: DC
  Filled 2023-07-30: qty 4, 14d supply, fill #0
  Filled 2023-08-07: qty 4, 28d supply, fill #1
  Filled 2023-09-08 (×2): qty 4, 28d supply, fill #2
  Filled 2023-10-07: qty 4, 28d supply, fill #3
  Filled 2023-11-04: qty 4, 28d supply, fill #4
  Filled 2023-11-24: qty 4, 28d supply, fill #5
  Filled 2023-12-25: qty 4, 28d supply, fill #6
  Filled 2024-01-20 – 2024-01-26 (×2): qty 4, 28d supply, fill #7
  Filled 2024-02-18 – 2024-02-22 (×2): qty 4, 28d supply, fill #8
  Filled 2024-03-17 – 2024-03-21 (×2): qty 4, 28d supply, fill #9
  Filled 2024-04-20 – 2024-05-09 (×2): qty 4, 28d supply, fill #10
  Filled 2024-05-30 – 2024-06-06 (×2): qty 4, 28d supply, fill #11

## 2023-07-28 MED ORDER — DEXAMETHASONE SODIUM PHOSPHATE 120 MG/30ML IJ SOLN
4.0000 mg | Freq: Once | INTRAMUSCULAR | Status: AC
  Administered 2023-07-28: 4 mg via INTRA_ARTICULAR

## 2023-07-28 NOTE — Telephone Encounter (Signed)
 Spoke to patient and advised submit to [facility] for Dupixent with dosing instructions and copay card number to call to restart

## 2023-07-29 ENCOUNTER — Other Ambulatory Visit: Payer: Self-pay | Admitting: Orthopaedic Surgery

## 2023-07-29 ENCOUNTER — Encounter: Payer: Self-pay | Admitting: Orthopaedic Surgery

## 2023-07-29 NOTE — Progress Notes (Signed)
 He presents today complaining of painful toenails.  He is also complaining of painful calluses subfifth bilateral.  Also complaining of painful plantar fasciitis.  States that after the injections he feels much better until just about time to come back for his toenails.  Objective: Vital signs are stable he is alert oriented x 3 pulses are palpable.  He still has palpable bursa beneath the fifth metatarsal heads bilaterally with overlying reactive hyperkeratotic lesions.  These are benign lesions.  He also has moderate to severe pain on palpation medial calcaneal tubercles bilateral.  No central or lateral calcaneal tubercle pain.  Assessment: Pain in limb secondary to onychomycosis bursitis plantar fasciitis bilateral and benign skin lesions.  Plan: Injected bursitis today 2 mg of Kenalog and local anesthetic beneath the fifth metatarsal heads bilaterally.  I also injected the bilateral plantar fascial regions with 10 mg of Kenalog 5 mg of Marcaine.  Debrided nails 1 through 5 and debrided all benign skin lesions.

## 2023-07-30 ENCOUNTER — Other Ambulatory Visit: Payer: Self-pay

## 2023-07-30 ENCOUNTER — Ambulatory Visit: Payer: BC Managed Care – PPO | Admitting: Nurse Practitioner

## 2023-07-30 ENCOUNTER — Other Ambulatory Visit (HOSPITAL_COMMUNITY): Payer: Self-pay

## 2023-07-30 NOTE — Progress Notes (Signed)
 Specialty Pharmacy Initiation Note   Mark Harrell. is a 63 y.o. male who will be followed by the specialty pharmacy service for RxSp Asthma/COPD    Review of administration, indication, effectiveness, safety, potential side effects, storage/disposable, and missed dose instructions occurred today for patient's specialty medication(s) Dupilumab (Dupixent)     Patient/Caregiver did not have any additional questions or concerns.   Patient's therapy is appropriate to: Initiate    Goals Addressed             This Visit's Progress    Minimize recurrence of flares       Patient is unable to be assessed as therapy was recently initiated. Patient will be evaluated at upcoming provider appointment to assess progress         Bobette Mo Specialty Pharmacist

## 2023-07-30 NOTE — Progress Notes (Signed)
 Specialty Pharmacy Initial Fill Coordination Note  Tod Abrahamsen. is a 62 y.o. male contacted today regarding initial fill of specialty medication(s) Dupilumab (Dupixent)   Patient requested Delivery   Delivery date: 08/05/23   Verified address: [address]  [place] [address]   Medication will be filled on 03/11.   Patient is aware of $1951.56 copayment. *has Dupixent copay card and a Dupixent Debit card!

## 2023-07-31 ENCOUNTER — Ambulatory Visit
Admission: RE | Admit: 2023-07-31 | Discharge: 2023-07-31 | Disposition: A | Discharge status: Home / Self Care | Payer: BC Managed Care – PPO | Source: Ambulatory Visit | Attending: Emergency Medicine | Admitting: Emergency Medicine

## 2023-07-31 DIAGNOSIS — R9389 Abnormal findings on diagnostic imaging of other specified body structures: Secondary | ICD-10-CM

## 2023-07-31 DIAGNOSIS — R918 Other nonspecific abnormal finding of lung field: Secondary | ICD-10-CM | POA: Diagnosis not present

## 2023-08-03 ENCOUNTER — Other Ambulatory Visit: Payer: Self-pay

## 2023-08-04 ENCOUNTER — Other Ambulatory Visit: Payer: Self-pay

## 2023-08-05 ENCOUNTER — Other Ambulatory Visit: Payer: Self-pay

## 2023-08-06 ENCOUNTER — Other Ambulatory Visit: Payer: Self-pay

## 2023-08-07 ENCOUNTER — Other Ambulatory Visit: Payer: Self-pay

## 2023-08-07 NOTE — Progress Notes (Signed)
 Specialty Pharmacy Refill Coordination Note  Mark Wolk. is a 63 y.o. male contacted today regarding refills of specialty medication(s) Dupilumab (Dupixent)   Patient requested Delivery   Delivery date: 08/18/23   Verified address: [address], [place], [place]  [address]   Medication will be filled on 03.24.25.

## 2023-08-13 ENCOUNTER — Ambulatory Visit: Admitting: Orthopaedic Surgery

## 2023-08-13 ENCOUNTER — Telehealth: Payer: Self-pay

## 2023-08-13 ENCOUNTER — Encounter: Payer: Self-pay | Admitting: Orthopaedic Surgery

## 2023-08-13 ENCOUNTER — Other Ambulatory Visit: Payer: Self-pay

## 2023-08-13 DIAGNOSIS — G8929 Other chronic pain: Secondary | ICD-10-CM | POA: Diagnosis not present

## 2023-08-13 DIAGNOSIS — M25562 Pain in left knee: Secondary | ICD-10-CM

## 2023-08-13 DIAGNOSIS — M25561 Pain in right knee: Secondary | ICD-10-CM

## 2023-08-13 MED ORDER — METHYLPREDNISOLONE ACETATE 40 MG/ML IJ SUSP
40.0000 mg | INTRAMUSCULAR | Status: AC | PRN
  Administered 2023-08-13: 40 mg via INTRA_ARTICULAR

## 2023-08-13 MED ORDER — LIDOCAINE HCL 1 % IJ SOLN
3.0000 mL | INTRAMUSCULAR | Status: AC | PRN
  Administered 2023-08-13: 3 mL

## 2023-08-13 NOTE — Progress Notes (Signed)
 The patient comes in today for steroid injections in both his knees.  Hyaluronic acid helped him quite a bit in terms of the long-term treatment for pain for osteoarthritis in his knees.  He would like to have those ordered since it will be 6 months next month since has had those injections.  He would like to have steroids today to temporize his knee pain.  He said no acute changes in medical status.  His last hemoglobin A1c was down to 5.3.  He is morbidly obese with a weight of 363 pounds last recorded.  He has had no acute change in medical status otherwise.  His knees have varus malalignment and global tenderness with pain throughout the arc of motion of his knees.  I did place a steroid injection in both knees today which she tolerated well.  He is a good candidate for water aerobics at [facility] for upper and lower extremity conditioning and strengthening through water aerobics.  Will see if we can get this ordered for him.  He is requested this as well.  We will then see him back in April a month from now for hyaluronic acid for both knees to treat the pain from long-term osteoarthritis.  This patient is diagnosed with osteoarthritis of the knee(s).    Radiographs show evidence of joint space narrowing, osteophytes, subchondral sclerosis and/or subchondral cysts.  This patient has knee pain which interferes with functional and activities of daily living.    This patient has experienced inadequate response, adverse effects and/or intolerance with conservative treatments such as acetaminophen, NSAIDS, topical creams, physical therapy or regular exercise, knee bracing and/or weight loss.   This patient has experienced inadequate response or has a contraindication to intra articular steroid injections for at least 3 months.   This patient is not scheduled to have a total knee replacement within 6 months of starting treatment with viscosupplementation.     Procedure Note  Patient: Mark Harrell.             Date of Birth: Mar 26, 1961           MRN: [id number]             Visit Date: 08/13/2023  Procedures: Visit Diagnoses:  1. Chronic pain of left knee   2. Chronic pain of right knee     Large Joint Inj: R knee on 08/13/2023 3:30 PM Indications: diagnostic evaluation and pain Details: 22 G 1.5 in needle, superolateral approach  Arthrogram: No  Medications: 3 mL lidocaine 1 %; 40 mg methylPREDNISolone acetate 40 MG/ML Outcome: tolerated well, no immediate complications Procedure, treatment alternatives, risks and benefits explained, specific risks discussed. Consent was given by the patient. Immediately prior to procedure a time out was called to verify the correct patient, procedure, equipment, support staff and site/side marked as required. Patient was prepped and draped in the usual sterile fashion.    Large Joint Inj: L knee on 08/13/2023 3:30 PM Indications: diagnostic evaluation and pain Details: 22 G 1.5 in needle, superolateral approach  Arthrogram: No  Medications: 3 mL lidocaine 1 %; 40 mg methylPREDNISolone acetate 40 MG/ML Outcome: tolerated well, no immediate complications Procedure, treatment alternatives, risks and benefits explained, specific risks discussed. Consent was given by the patient. Immediately prior to procedure a time out was called to verify the correct patient, procedure, equipment, support staff and site/side marked as required. Patient was prepped and draped in the usual sterile fashion.

## 2023-08-13 NOTE — Telephone Encounter (Signed)
Bilateral gel injections  

## 2023-08-15 ENCOUNTER — Other Ambulatory Visit: Payer: Self-pay | Admitting: Allergy & Immunology

## 2023-08-17 ENCOUNTER — Other Ambulatory Visit: Payer: Self-pay

## 2023-08-19 ENCOUNTER — Encounter: Payer: Self-pay | Admitting: Emergency Medicine

## 2023-08-24 DIAGNOSIS — R1084 Generalized abdominal pain: Secondary | ICD-10-CM | POA: Diagnosis not present

## 2023-08-24 DIAGNOSIS — N132 Hydronephrosis with renal and ureteral calculous obstruction: Secondary | ICD-10-CM | POA: Diagnosis not present

## 2023-08-31 ENCOUNTER — Telehealth: Payer: Self-pay

## 2023-08-31 ENCOUNTER — Ambulatory Visit (HOSPITAL_BASED_OUTPATIENT_CLINIC_OR_DEPARTMENT_OTHER): Payer: BC Managed Care – PPO | Admitting: Family Medicine

## 2023-08-31 ENCOUNTER — Telehealth: Payer: Self-pay | Admitting: Emergency Medicine

## 2023-08-31 VITALS — BP 142/82 | HR 68 | Ht 72.0 in | Wt 358.9 lb

## 2023-08-31 DIAGNOSIS — E876 Hypokalemia: Secondary | ICD-10-CM

## 2023-08-31 DIAGNOSIS — Z79899 Other long term (current) drug therapy: Secondary | ICD-10-CM

## 2023-08-31 DIAGNOSIS — E119 Type 2 diabetes mellitus without complications: Secondary | ICD-10-CM | POA: Insufficient documentation

## 2023-08-31 DIAGNOSIS — R4189 Other symptoms and signs involving cognitive functions and awareness: Secondary | ICD-10-CM | POA: Diagnosis not present

## 2023-08-31 DIAGNOSIS — R21 Rash and other nonspecific skin eruption: Secondary | ICD-10-CM | POA: Diagnosis not present

## 2023-08-31 DIAGNOSIS — S161XXD Strain of muscle, fascia and tendon at neck level, subsequent encounter: Secondary | ICD-10-CM

## 2023-08-31 DIAGNOSIS — Z7984 Long term (current) use of oral hypoglycemic drugs: Secondary | ICD-10-CM

## 2023-08-31 HISTORY — DX: Type 2 diabetes mellitus without complications: E11.9

## 2023-08-31 MED ORDER — MEMANTINE HCL 10 MG PO TABS: 10.0000 mg | ORAL_TABLET | Freq: Two times a day (BID) | ORAL | 3 refills | Status: AC

## 2023-08-31 MED ORDER — MONTELUKAST SODIUM 10 MG PO TABS: 10.0000 mg | ORAL_TABLET | Freq: Every day | ORAL | 0 refills | Status: DC

## 2023-08-31 MED ORDER — TIRZEPATIDE 5 MG/0.5ML ~~LOC~~ SOAJ: 5.0000 mg | SUBCUTANEOUS | 1 refills | Status: DC

## 2023-08-31 MED ORDER — POTASSIUM CHLORIDE CRYS ER 20 MEQ PO TBCR: 20.0000 meq | EXTENDED_RELEASE_TABLET | Freq: Every day | ORAL | 3 refills | Status: DC

## 2023-08-31 MED ORDER — CYCLOBENZAPRINE HCL 10 MG PO TABS: ORAL_TABLET | ORAL | 2 refills | Status: DC

## 2023-08-31 NOTE — Patient Instructions (Signed)
  Medication Instructions:  Your physician recommends that you continue on your current medications as directed. Please refer to the Current Medication list given to you today. --If you need a refill on any your medications before your next appointment, please call your pharmacy first. If no refills are authorized on file call the office.--  Referrals/Procedures/Imaging: See below   Follow-Up: Your next appointment:   Your physician recommends that you schedule a follow-up appointment in: 1 month follow up with Dr. de Peru  You will receive a text message or e-mail with a link to a survey about your care and experience with [place] today! We would greatly appreciate your feedback!   Thanks for letting us be apart of your health journey!!  Primary Care and Sports Medicine   Dr. Ceasar Mons Peru   We encourage you to activate your patient portal called "MyChart".  Sign up information is provided on this After Visit Summary.  MyChart is used to connect with patients for Virtual Visits (Telemedicine).  Patients are able to view lab/test results, encounter notes, upcoming appointments, etc.  Non-urgent messages can be sent to your provider as well. To learn more about what you can do with MyChart, please visit --  [email or web address].

## 2023-08-31 NOTE — Telephone Encounter (Signed)
 [facility] called wanting to fax over information about oxygen tank management. I told the [facility] representative that Dr. Dellis Anes does not prescribe/manage Oxygen for patients.

## 2023-08-31 NOTE — Progress Notes (Unsigned)
 New Patient Office Visit  Subjective   Patient ID: Mark Harrell., male    DOB: 02-09-1961  Age: 63 y.o. MRN: [id number]  CC:  Chief Complaint  Patient presents with   Establish Care    Establish Care, kidney stone, goes back to see urologist on the 15th to see if passed, edema in legs and feer    HPI Mark Harrell. presents to establish care Last PCP - Dr. Oneta Rack  Recently passed kidney stone. Follows with urologist.  HTN: has been having trouble controlling blood pressure since recent COVID illness. Taking losartan and metoprolol. Other meds in the past, but unsure of which.  Requesting Dermatology referral due to skin issues primarily related to rash in inguinal region due to nocturnal enuresis. This has been present for some time.  DM: was recently transitioning to [facility]. No side effects thus far. Was on Ozempic previously, but looking to change due to attempting to improve weight loss and due to associated sleep apnea. Also taking metformin Associated neuropathy, taking Lyrica  Patient with extensive medical history/chronic medical issues.  Also with numerous medications as listed below. Does follow with cardiology, nephrology, allergist, pulmonologist, orthopedist, neurologist, podiatrist, GI, urologist.  Has additionally had recent concerns related to memory.  His last PCP was prescribing both Aricept and Namenda.  Patient does have interested in having more formal assessment with neurology.  He has seen neurology primarily related to migraines in the past and receiving Botox injection related to this.  He is requesting refill of potassium supplement which he takes when taking furosemide.  This primarily has been managed by cardiology in the past, however patient has not had recent follow-up with cardiology and is overdue for this.  He indicates that he plans to reschedule follow-up appointment.  Outpatient Encounter Medications as of 08/31/2023  Medication Sig    oxyCODONE (OXY IR/ROXICODONE) 5 MG immediate release tablet Take 5 mg by mouth every 6 (six) hours as needed.   tamsulosin (FLOMAX) 0.4 MG CAPS capsule Take 0.4 mg by mouth at bedtime.   tirzepatide [facility]) 5 MG/0.5ML Pen Inject 5 mg into the skin once a week.   acetaminophen (TYLENOL) 500 MG tablet Take 1,000 mg by mouth 3 (three) times daily.   albuterol (VENTOLIN HFA) 108 (90 Base) MCG/ACT inhaler Inhale 1-2 puffs into the lungs every 6 (six) hours as needed for wheezing or shortness of breath.   anastrozole (ARIMIDEX) 1 MG tablet Take 1 mg by mouth daily.   ascorbic acid (VITAMIN C) 1000 MG tablet Take 1,000 mg by mouth every evening.   botulinum toxin Type A (BOTOX) 200 units injection Provider to inject 155 units into the muscles of the head and neck every 12 weeks. Discard remainder.   budesonide (PULMICORT) 0.5 MG/2ML nebulizer solution INHALE CONTENTS OF 1 VIAL VIA NEBULIZER 2 (TWO) TIMES DAILY AS NEEDED.   Calcium Carb-Cholecalciferol (CALCIUM + VITAMIN D3 PO) Take 1 tablet by mouth daily.   celecoxib (CELEBREX) 200 MG capsule TAKE 1 CAPSULE (200 MG TOTAL) BY MOUTH EVERY 12 (TWELVE) HOURS.   cetaphil (CETAPHIL) lotion Apply 1 Application topically 2 (two) times daily.   cetirizine (ZYRTEC) 10 MG tablet Take 1 tablet (10 mg total) by mouth daily as needed for allergies.   Cholecalciferol (VITAMIN D3) 125 MCG (5000 UT) TABS Take 5,000-10,000 Units by mouth See admin instructions. Take 10,000 in the morning and 5,000 units in the evening   Cinnamon 500 MG capsule Take 1,000 mg by mouth  3 (three) times daily.   Coenzyme Q10 (CO Q 10 PO) Take 600 mg by mouth daily.   cyclobenzaprine (FLEXERIL) 10 MG tablet TAKE 1 TABLET 3 TIMES A DAY ONLY IF NEEDED FOR MUSCLE SPASM (NECK)   Dextromethorphan HBr (DELSYM PO) Take by mouth.   diclofenac Sodium (VOLTAREN) 1 % GEL Apply 2-4 g topically 2 (two) times daily as needed (pain).   DM-APAP-CPM (CORICIDIN HBP PO) Take by mouth.   donepezil (ARICEPT)  23 MG TABS tablet TAKE 1 TABLET DAILY FOR MEMORY   EPINEPHrine 0.3 mg/0.3 mL IJ SOAJ injection Inject 0.3 mg into the muscle as needed for anaphylaxis.   Guaifenesin 1200 MG TB12 Take 1,200 mg by mouth 3 (three) times daily.   ipratropium (ATROVENT) 0.03 % nasal spray Place 2 sprays into the nose 3 (three) times daily as needed for rhinitis.   ipratropium-albuterol (DUONEB) 0.5-2.5 (3) MG/3ML SOLN Take 3 mLs by nebulization 2 (two) times daily as needed.   Krill Oil 500 MG CAPS Take 500 mg by mouth 2 (two) times daily.   losartan (COZAAR) 100 MG tablet Take 1 tablet (100 mg total) by mouth daily.   Magnesium 500 MG CAPS Take 500 mg by mouth 3 (three) times daily.   memantine (NAMENDA) 10 MG tablet Take 1 tablet (10 mg total) by mouth 2 (two) times daily.   metFORMIN (GLUCOPHAGE-XR) 500 MG 24 hr tablet TAKES 2 TABLETS TWICE A DAY WITH MEALS FOR DIABETES   metoprolol tartrate (LOPRESSOR) 25 MG tablet TAKE 1 TABLET (25 MG TOTAL) BY MOUTH IN THE MORNING, AT NOON, AND AT BEDTIME.   Misc Natural Products (GLUCOS-CHONDROIT-MSM COMPLEX) TABS Take 2 tablets by mouth 3 (three) times daily.   montelukast (SINGULAIR) 10 MG tablet Take 1 tablet (10 mg total) by mouth at bedtime.   Multiple Vitamins-Minerals (MULTIVITAMIN WITH MINERALS) tablet Take 1 tablet by mouth every morning.   Multiple Vitamins-Minerals (PRESERVISION AREDS 2) CAPS Take 1 capsule by mouth 2 (two) times daily. Evening and night   Olopatadine HCl 0.6 % SOLN Place 2 sprays into the nose 2 (two) times daily as needed.   oxymetazoline (AFRIN) 0.05 % nasal spray Place 1 spray into both nostrils at bedtime.   pentosan polysulfate (ELMIRON) 100 MG capsule Take 100 mg by mouth 3 (three) times daily.   potassium chloride SA (KLOR-CON M20) 20 MEQ tablet Take 1 tablet (20 mEq total) by mouth daily.   pravastatin (PRAVACHOL) 40 MG tablet TAKE 1 TABLET BY MOUTH EVERY DAY AT BEDTIME FOR CHOLESTEROL   pregabalin (LYRICA) 150 MG capsule TAKE 1 CAPSULE 3  TIMES A DAY FOR CHRONIC PAIN   promethazine-dextromethorphan (PROMETHAZINE-DM) 6.25-15 MG/5ML syrup TAKE 5 MLS BY MOUTH 4 TIMES A DAY AS NEEDED FOR COUGH   sertraline (ZOLOFT) 100 MG tablet TAKE 2 TABLETS DAILY FOR MOOD / DEPRESSION   sodium chloride (OCEAN) 0.65 % SOLN nasal spray Place 1 spray into both nostrils 4 (four) times daily as needed for congestion. Uses each time before other nasal sprays   tadalafil (CIALIS) 5 MG tablet Take 5 mg by mouth daily.    traZODone (DESYREL) 50 MG tablet TAKE 1 TABLET BY MOUTH EVERYDAY AT BEDTIME   TRELEGY ELLIPTA 100-62.5-25 MCG/ACT AEPB Inhale 1 puff into the lungs daily.   triamcinolone ointment (KENALOG) 0.1 % Apply 1 Application topically 2 (two) times daily.   trospium (SANCTURA) 20 MG tablet Take 20 mg by mouth 2 (two) times daily. In the evening and at bedtime   TURMERIC  PO Take 2,000 mg by mouth 3 (three) times daily. 1000 mg each   zinc gluconate 50 MG tablet Take 50 mg by mouth 3 (three) times daily.   [DISCONTINUED] benzonatate (TESSALON) 200 MG capsule Take 1 capsule 3 x /day to Prevent Cough                                                     /                                  TAKE                               BY                              MOUTH (Patient not taking: Reported on 07/14/2023)   [DISCONTINUED] cyclobenzaprine (FLEXERIL) 10 MG tablet TAKE 1 TABLET 3 TIMES A DAY ONLY IF NEEDED FOR MUSCLE SPASM (NECK)   [DISCONTINUED] furosemide (LASIX) 80 MG tablet Take 80 mg by mouth daily as needed for edema. (Patient not taking: Reported on 07/14/2023)   [DISCONTINUED] KLOR-CON M20 20 MEQ tablet TAKE 1 TABLET TWICE DAILY FOR POTASSIUM   [DISCONTINUED] memantine (NAMENDA) 10 MG tablet Take 1 tablet (10 mg total) by mouth 2 (two) times daily.   [DISCONTINUED] montelukast (SINGULAIR) 10 MG tablet Take 1 tablet (10 mg total) by mouth at bedtime.   [DISCONTINUED] tirzepatide [facility]) 2.5 MG/0.5ML Pen Inject 2.5 mg into the skin once a week.   No  facility-administered encounter medications on file as of 08/31/2023.    Past Medical History:  Diagnosis Date   Anesthesia complication requiring reversal agent administration    ? from central apnea, very difficult to get off vent   Anxiety    Arthritis    osteo   Asthma    BPH (benign prostatic hyperplasia)    Complication of anesthesia    difficulty waking , they twlight me because of my respiratory problems "   COPD (chronic obstructive pulmonary disease) (HCC)    COVID    moderate case 2023   Depression    Diabetes mellitus (HCC) 08/31/2023   DVT (deep venous thrombosis) (HCC) 2023   right leg   Dyspnea    on exertion   Enlarged heart    Family history of adverse reaction to anesthesia    mother trouble waking up, and heart stopped   GERD (gastroesophageal reflux disease)    Headache    botox injections for headaches   Hyperlipidemia    Hypertension    Hypogonadism male    IBS (irritable bowel syndrome)    Memory difficulties    short term memories   Neuropathy    Obesity    On home oxygen therapy    on 2 liter   OSA (obstructive sleep apnea)    not using cpap   Paralysis (HCC)    left hand - small    due to accident with an head injury   Pneumonia    Pre-diabetes    Prostatitis     Past Surgical History:  Procedure Laterality Date   ABDOMINAL SURGERY  ANKLE FRACTURE SURGERY Right    ANTERIOR CERVICAL DECOMP/DISCECTOMY FUSION N/A 05/23/2019   Procedure: ANTERIOR CERVICAL DISCECTOMY FUSION CERVICAL FIVE THROUGH CERVICAL SIX AND CERVICAL SIX THROUGH CERVICAL SEVEN;  Surgeon: Kerrin Champagne, MD;  Location: [facility] OR;  Service: Orthopedics;  Laterality: N/A;   BRONCHIAL BRUSHINGS  04/28/2022   Procedure: BRONCHIAL BRUSHINGS;  Surgeon: Leslye Peer, MD;  Location: [facility] ENDOSCOPY;  Service: Pulmonary;;   BRONCHIAL NEEDLE ASPIRATION BIOPSY  04/28/2022   Procedure: BRONCHIAL NEEDLE ASPIRATION BIOPSIES;  Surgeon: Leslye Peer, MD;  Location: [facility] ENDOSCOPY;  Service:  Pulmonary;;   COLONOSCOPY     CYSTOSCOPY     [place]   KNEE ARTHROSCOPY WITH MEDIAL MENISECTOMY Left 01/02/2017   Procedure: LEFT KNEE ARTHROSCOPY WITH PARTIAL MEDIAL MENISCECTOMY;  Surgeon: Kathryne Hitch, MD;  Location: [facility] ORS;  Service: Orthopedics;  Laterality: Left;   POSTERIOR CERVICAL FUSION/FORAMINOTOMY N/A 08/20/2020   Procedure: LEFT CERVICAL SIX THROUGH SEVEN  AND CERVICAL SEVEN THROUGH THORACIC ONE  FORAMINOTOMIES WITH EXCISION OF DISC HERNIATION LEFT CERVICAL SEVEN THROUGH THORACIC ONE;  Surgeon: Kerrin Champagne, MD;  Location: [facility] OR;  Service: Orthopedics;  Laterality: N/A;   TONSILLECTOMY     TURBINATE RESECTION  2007   UVULOPALATOPHARYNGOPLASTY     VIDEO BRONCHOSCOPY WITH RADIAL ENDOBRONCHIAL ULTRASOUND  04/28/2022   Procedure: VIDEO BRONCHOSCOPY WITH RADIAL ENDOBRONCHIAL ULTRASOUND;  Surgeon: Leslye Peer, MD;  Location: [facility] ENDOSCOPY;  Service: Pulmonary;;    Family History  Problem Relation Age of Onset   Dementia Mother    COPD Mother    Hypertension Mother    Diabetes Mother    Cancer Father        lymphoma, colon   Migraines Sister    Prostate cancer Maternal Uncle    Migraines Paternal Aunt    Diabetes Paternal Uncle    Diabetes Maternal Grandmother    Heart disease Maternal Grandfather    Diabetes Maternal Grandfather    Diabetes Paternal Grandmother    Diabetes Paternal Grandfather    Heart disease Paternal Grandfather    Lung disease Neg Hx    Rheumatologic disease Neg Hx     Social History   Socioeconomic History   Marital status: Single    Spouse name: Not on file   Number of children: Not on file   Years of education: Not on file   Highest education level: Master's degree (e.g., MA, MS, MEng, MEd, MSW, MBA)  Occupational History   Occupation: Quarry manager  Tobacco Use   Smoking status: Former    Current packs/day: 0.00    Average packs/day: 0.1 packs/day for 15.0 years (1.5 ttl pk-yrs)    Types: Cigarettes    Start date:  05/26/1968    Quit date: 05/27/1983    Years since quitting: 40.2    Passive exposure: Past   Smokeless tobacco: Never   Tobacco comments:    significant second-hand exposure through mother  Vaping Use   Vaping status: Never Used  Substance and Sexual Activity   Alcohol use: Not Currently    Comment: 2 x a year   Drug use: No   Sexual activity: Never    Birth control/protection: None  Other Topics Concern   Not on file  Social History Narrative   [facility]:   Originally from [place]. Previously has lived in [place] & [place]. He has lived in [place], [place], & [place]. He has worked in [employer]. No pets currently. Brief exposure to a roommates bird (  cockatoo) in college. No mold, asbestos, or hot tub exposure.    Social Drivers of Corporate investment banker Strain: Low Risk  (08/31/2023)   Overall Financial Resource Strain (CARDIA)    Difficulty of Paying Living Expenses: Not hard at all  Food Insecurity: No Food Insecurity (08/31/2023)   Hunger Vital Sign    Worried About Running Out of Food in the Last Year: Never true    Ran Out of Food in the Last Year: Never true  Transportation Needs: No Transportation Needs (08/31/2023)   PRAPARE - Administrator, Civil Service (Medical): No    Lack of Transportation (Non-Medical): No  Physical Activity: Unknown (08/31/2023)   Exercise Vital Sign    Days of Exercise per Week: 0 days    Minutes of Exercise per Session: Not on file  Stress: Stress Concern Present (08/31/2023)   [employer] of Occupational Health - Occupational Stress Questionnaire    Feeling of Stress : Very much  Social Connections: Moderately Isolated (08/31/2023)   Social Connection and Isolation Panel [NHANES]    Frequency of Communication with Friends and Family: Never    Frequency of Social Gatherings with Friends and Family: More than three times a week    Attends Religious Services: 1 to 4 times per year    Active Member of [employer] or  Organizations: No    Attends Engineer, structural: Not on file    Marital Status: Never married  Intimate Partner Violence: Not At Risk (09/19/2022)   [facility]   HITS    Over the last 12 months how often did your partner physically hurt you?: Never    Over the last 12 months how often did your partner insult you or talk down to you?: Never    Over the last 12 months how often did your partner threaten you with physical harm?: Never    Over the last 12 months how often did your partner scream or curse at you?: Never    Objective   BP (!) 142/82 (BP Location: Left Arm, Patient Position: Sitting, Cuff Size: Large)   Pulse 68   Ht 6' (1.829 m)   Wt (!) 358 lb 14.4 oz (162.8 kg)   SpO2 94%   BMI 48.68 kg/m   Physical Exam  63 year old male in no acute distress Cardiovascular exam regular rate and rhythm Lungs clear to auscultation bilaterally  Assessment & Plan:   Type 2 diabetes mellitus without complication, without long-term current use of insulin (HCC) Assessment & Plan: Ultimately, prior hemoglobin A1c is all been less than 6.5%.  Given history of diabetes, would be reasonable to continue with metformin and we can look to titrate dose of Mounjaro.  He has administered 3 doses of 2.5 mg dosage.  Advised on increasing to 5 mg dosage as long as he does well with fourth administration of 2.5 mg.  Discussed risk of potential side effects with increasing dose.  Discussed that lower dosages help to improve tolerability, however may not see as much benefit from a blood sugar control or weight loss standpoint and would expect this to be more notable with increasing dosage of Mounjaro.  Orders: -     Tirzepatide; Inject 5 mg into the skin once a week.  Dispense: 2 mL; Refill: 1  Hypokalemia -     Potassium Chloride Crys ER; Take 1 tablet (20 mEq total) by mouth daily.  Dispense: 90 tablet; Refill: 3  Rash -  Ambulatory referral to  Dermatology  Cognitive changes Assessment & Plan: Can continue with current medications, however I do recommend that patient have formal evaluation with neurology regarding this.  He has been seeing [facility] related to migraine management, did place referral to their office today for specific evaluation related to cognitive changes.  Orders: -     Ambulatory referral to Neurology  Medication management -     Memantine HCl; Take 1 tablet (10 mg total) by mouth 2 (two) times daily.  Dispense: 270 tablet; Refill: 3  Cervical myofascial strain, subsequent encounter -     Cyclobenzaprine HCl; TAKE 1 TABLET 3 TIMES A DAY ONLY IF NEEDED FOR MUSCLE SPASM (NECK)  Dispense: 90 tablet; Refill: 2  Other orders -     Montelukast Sodium; Take 1 tablet (10 mg total) by mouth at bedtime.  Dispense: 90 tablet; Refill: 0  Return in about 1 month (around 09/30/2023) for diabetes, med check, 40 minutes.   Spent 56 minutes on this patient encounter, including preparation, chart review, face-to-face counseling with patient and coordination of care, and documentation of encounter   ___________________________________________ Nathanial Arrighi de Peru, MD, ABFM, [facility] Primary Care and Sports Medicine [facility]

## 2023-08-31 NOTE — Telephone Encounter (Signed)
 VOB submitted for Orthovisc, bilateral knee

## 2023-08-31 NOTE — Telephone Encounter (Signed)
 Cmn received from [facility] for oxygen therapy SWO.

## 2023-09-01 NOTE — Assessment & Plan Note (Signed)
 Can continue with current medications, however I do recommend that patient have formal evaluation with neurology regarding this.  He has been seeing [facility] related to migraine management, did place referral to their office today for specific evaluation related to cognitive changes.

## 2023-09-01 NOTE — Telephone Encounter (Signed)
 Yes that is exactly right. Thanks!

## 2023-09-01 NOTE — Assessment & Plan Note (Signed)
 Ultimately, prior hemoglobin A1c is all been less than 6.5%.  Given history of diabetes, would be reasonable to continue with metformin and we can look to titrate dose of Mounjaro.  He has administered 3 doses of 2.5 mg dosage.  Advised on increasing to 5 mg dosage as long as he does well with fourth administration of 2.5 mg.  Discussed risk of potential side effects with increasing dose.  Discussed that lower dosages help to improve tolerability, however may not see as much benefit from a blood sugar control or weight loss standpoint and would expect this to be more notable with increasing dosage of Mounjaro.

## 2023-09-03 NOTE — Telephone Encounter (Signed)
 CMN faxed successfully and signed.

## 2023-09-08 ENCOUNTER — Other Ambulatory Visit: Payer: Self-pay

## 2023-09-08 ENCOUNTER — Other Ambulatory Visit (HOSPITAL_COMMUNITY): Payer: Self-pay

## 2023-09-08 NOTE — Progress Notes (Signed)
 Specialty Pharmacy Refill Coordination Note  Kourtland L Shaheen Jr. is a 63 y.o. male contacted today regarding refills of specialty medication(s) Dupixent.  Patient requested (Patient-Rptd) Delivery   Delivery date: (Patient-Rptd) 09/15/23   Verified address: (Patient-Rptd) [address], [place], [place] [address]   Medication will be filled on 09/14/23.

## 2023-09-09 ENCOUNTER — Telehealth: Payer: Self-pay

## 2023-09-09 NOTE — Telephone Encounter (Signed)
 Faxed completed PA form to [facility] at [phone]. PA pending

## 2023-09-10 DIAGNOSIS — N201 Calculus of ureter: Secondary | ICD-10-CM | POA: Diagnosis not present

## 2023-09-10 DIAGNOSIS — R8271 Bacteriuria: Secondary | ICD-10-CM | POA: Diagnosis not present

## 2023-09-14 ENCOUNTER — Ambulatory Visit: Admitting: Orthopaedic Surgery

## 2023-09-14 ENCOUNTER — Other Ambulatory Visit: Payer: Self-pay

## 2023-09-14 DIAGNOSIS — M1711 Unilateral primary osteoarthritis, right knee: Secondary | ICD-10-CM

## 2023-09-14 DIAGNOSIS — M1712 Unilateral primary osteoarthritis, left knee: Secondary | ICD-10-CM

## 2023-09-14 DIAGNOSIS — M170 Bilateral primary osteoarthritis of knee: Secondary | ICD-10-CM | POA: Diagnosis not present

## 2023-09-14 MED ORDER — HYALURONAN 30 MG/2ML IX SOSY
30.0000 mg | PREFILLED_SYRINGE | INTRA_ARTICULAR | Status: AC | PRN
  Administered 2023-09-14: 30 mg via INTRA_ARTICULAR

## 2023-09-14 NOTE — Progress Notes (Signed)
   Procedure Note  Patient: Mark Harrell.             Date of Birth: [birth year]           MRN: [id number]             Visit Date: 09/14/2023  Procedures: Visit Diagnoses:  1. Unilateral primary osteoarthritis, right knee   2. Unilateral primary osteoarthritis, left knee     Large Joint Inj: R knee on 09/14/2023 4:40 PM Indications: diagnostic evaluation and pain Details: 22 G 1.5 in needle, superolateral approach  Arthrogram: No  Medications: 30 mg Hyaluronan 30 MG/2ML Outcome: tolerated well, no immediate complications Procedure, treatment alternatives, risks and benefits explained, specific risks discussed. Consent was given by the patient. Immediately prior to procedure a time out was called to verify the correct patient, procedure, equipment, support staff and site/side marked as required. Patient was prepped and draped in the usual sterile fashion.    Large Joint Inj: L knee on 09/14/2023 4:40 PM Indications: diagnostic evaluation and pain Details: 22 G 1.5 in needle, superolateral approach  Arthrogram: No  Medications: 30 mg Hyaluronan 30 MG/2ML Outcome: tolerated well, no immediate complications Procedure, treatment alternatives, risks and benefits explained, specific risks discussed. Consent was given by the patient. Immediately prior to procedure a time out was called to verify the correct patient, procedure, equipment, support staff and site/side marked as required. Patient was prepped and draped in the usual sterile fashion.    The patient comes in today for injection #1 of a series of 3 Orthovisc injections with hyaluronic acid in both knees to treat the pain from osteoarthritis.  He has had similar injections in the past.  He has been dealing with kidney over kidney stones recently.  Both knees showed no significant effusion today.  They both have varus malalignment and pain.  I did place Orthovisc No. 1 of a series of 3 injections in both knees which he tolerated  well.  Will see him back next week for injection #2 of the series of 3 injections.  Lot number: [id number]

## 2023-09-16 ENCOUNTER — Ambulatory Visit: Payer: BC Managed Care – PPO | Admitting: Adult Health

## 2023-09-16 ENCOUNTER — Other Ambulatory Visit: Payer: Self-pay

## 2023-09-16 DIAGNOSIS — J449 Chronic obstructive pulmonary disease, unspecified: Secondary | ICD-10-CM | POA: Diagnosis not present

## 2023-09-16 DIAGNOSIS — M1711 Unilateral primary osteoarthritis, right knee: Secondary | ICD-10-CM

## 2023-09-16 DIAGNOSIS — M1712 Unilateral primary osteoarthritis, left knee: Secondary | ICD-10-CM

## 2023-09-16 DIAGNOSIS — J9611 Chronic respiratory failure with hypoxia: Secondary | ICD-10-CM | POA: Diagnosis not present

## 2023-09-16 DIAGNOSIS — I10 Essential (primary) hypertension: Secondary | ICD-10-CM | POA: Diagnosis not present

## 2023-09-22 ENCOUNTER — Ambulatory Visit: Admitting: Physician Assistant

## 2023-09-22 DIAGNOSIS — M170 Bilateral primary osteoarthritis of knee: Secondary | ICD-10-CM | POA: Diagnosis not present

## 2023-09-22 MED ORDER — LIDOCAINE HCL 1 % IJ SOLN
3.0000 mL | INTRAMUSCULAR | Status: AC | PRN
Start: 2023-09-22 — End: 2023-09-22
  Administered 2023-09-22: 3 mL

## 2023-09-22 MED ORDER — BUPIVACAINE HCL 0.25 % IJ SOLN
0.6600 mL | INTRAMUSCULAR | Status: AC | PRN
  Administered 2023-09-22: .66 mL via INTRA_ARTICULAR

## 2023-09-22 MED ORDER — HYALURONAN 30 MG/2ML IX SOSY
30.0000 mg | PREFILLED_SYRINGE | INTRA_ARTICULAR | Status: AC | PRN
  Administered 2023-09-22: 30 mg via INTRA_ARTICULAR

## 2023-09-22 NOTE — Progress Notes (Addendum)
   Procedure Note  Patient: Mark Harrell.             Date of Birth: 1961-03-06           MRN: [id number]             Visit Date: 09/22/2023  Procedures: Visit Diagnoses:  1. Bilateral primary osteoarthritis of knee     Large Joint Inj: bilateral knee on 09/22/2023 1:59 PM Indications: pain Details: 22 G needle, anterolateral approach Medications (Right): 0.66 mL bupivacaine  0.25 %; 3 mL lidocaine  1 %; 30 mg Hyaluronan 30 MG/2ML Medications (Left): 0.66 mL bupivacaine  0.25 %; 3 mL lidocaine  1 %; 30 mg Hyaluronan 30 MG/2ML

## 2023-09-23 DIAGNOSIS — E119 Type 2 diabetes mellitus without complications: Secondary | ICD-10-CM | POA: Diagnosis not present

## 2023-09-23 DIAGNOSIS — H04123 Dry eye syndrome of bilateral lacrimal glands: Secondary | ICD-10-CM | POA: Diagnosis not present

## 2023-09-23 LAB — HM DIABETES EYE EXAM

## 2023-09-28 ENCOUNTER — Ambulatory Visit: Admitting: Physician Assistant

## 2023-09-29 ENCOUNTER — Encounter: Payer: Self-pay | Admitting: Physician Assistant

## 2023-09-29 ENCOUNTER — Encounter: Payer: Self-pay | Admitting: Adult Health

## 2023-09-29 ENCOUNTER — Ambulatory Visit: Payer: BC Managed Care – PPO | Admitting: Adult Health

## 2023-09-29 ENCOUNTER — Ambulatory Visit: Admitting: Physician Assistant

## 2023-09-29 DIAGNOSIS — M170 Bilateral primary osteoarthritis of knee: Secondary | ICD-10-CM

## 2023-09-29 DIAGNOSIS — M1711 Unilateral primary osteoarthritis, right knee: Secondary | ICD-10-CM

## 2023-09-29 DIAGNOSIS — G43709 Chronic migraine without aura, not intractable, without status migrainosus: Secondary | ICD-10-CM | POA: Diagnosis not present

## 2023-09-29 DIAGNOSIS — M1712 Unilateral primary osteoarthritis, left knee: Secondary | ICD-10-CM

## 2023-09-29 MED ORDER — ONABOTULINUMTOXINA 200 UNITS IJ SOLR
155.0000 [IU] | Freq: Once | INTRAMUSCULAR | Status: AC
  Administered 2023-09-29: 155 [IU] via INTRAMUSCULAR

## 2023-09-29 MED ORDER — HYALURONAN 30 MG/2ML IX SOSY
30.0000 mg | PREFILLED_SYRINGE | INTRA_ARTICULAR | Status: AC | PRN
  Administered 2023-09-29: 30 mg via INTRA_ARTICULAR

## 2023-09-29 NOTE — Progress Notes (Signed)
 Office Visit Note   Patient: Mark Harrell.           Date of Birth: July 24, 1960           MRN: [id number] Visit Date: 09/29/2023              Requested by: de Peru, Raymond J, MD [address] [place],  [place] [address] PCP: de Peru, Raymond J, MD  Osteoarthritis bilateral knees    HPI: Pleasant 63 year old gentleman comes in for his third Orthovisc injection into his bilateral knees patient of Dr. Arvella Bird.  He has had these in the past he thinks he is feeling some relief  Assessment & Plan: Visit Diagnoses: Osteoarthritis bilateral knees  Plan: Injection given without difficulty may follow-up with Dr. Lucienne Ryder as needed  Follow-Up Instructions: No follow-ups on file.   Ortho Exam  Patient is alert, oriented, no adenopathy, well-dressed, normal affect, normal respiratory effort. Bilateral knees no effusion no erythema compartments are soft and compressible  Imaging: No results found. No images are attached to the encounter.  Labs: Lab Results  Component Value Date   HGBA1C 5.3 04/29/2023   HGBA1C 5.6 01/28/2023   HGBA1C 5.4 10/14/2022   ESRSEDRATE 40 (H) 05/26/2019   ESRSEDRATE 7 07/29/2011   ESRSEDRATE 11 05/07/2011   CRP 1.1 (H) 10/05/2021   CRP 1.4 (H) 10/04/2021   CRP 1.03 (H) 05/07/2011   REPTSTATUS 04/30/2022 FINAL 04/28/2022   REPTSTATUS 05/03/2022 FINAL 04/28/2022   GRAMSTAIN NO WBC SEEN NO ORGANISMS SEEN  04/28/2022   GRAMSTAIN NO WBC SEEN NO ORGANISMS SEEN  04/28/2022   CULT  04/28/2022    NO GROWTH 2 DAYS Performed at [facility], [address], [place], [place] [address]    CULT  04/28/2022    No growth aerobically or anaerobically. Performed at [facility], [address], [place], [place] [address]    LABORGA NO GROWTH 07/24/2016     Lab Results  Component Value Date   ALBUMIN  3.2 (L) 10/08/2021   ALBUMIN  3.2 (L) 10/07/2021   ALBUMIN  3.4 (L) 10/06/2021    Lab Results  Component Value Date   MG 2.0  04/29/2023   MG 2.3 10/14/2022   MG 2.1 02/18/2022   Lab Results  Component Value Date   VD25OH 94 04/29/2023   VD25OH 100 10/14/2022   VD25OH 100 06/05/2022    No results found for: "PREALBUMIN"    Latest Ref Rng & Units 04/29/2023    3:17 PM 03/04/2023    3:44 PM 01/28/2023    4:22 PM  CBC EXTENDED  WBC 3.8 - 10.8 Thousand/uL 10.3  10.0  14.3   RBC 4.20 - 5.80 Million/uL 5.35  5.70  5.43   Hemoglobin 13.2 - 17.1 g/dL 21.3  08.6  57.8   HCT 38.5 - 50.0 % 47.7  50.4  47.9   Platelets 140 - 400 Thousand/uL 237  282  281   NEUT# 1,500 - 7,800 cells/uL 5,840  6,380  9,038   Lymph# 850 - 3,900 cells/uL  2,280  3,332      There is no height or weight on file to calculate BMI.  Orders:  No orders of the defined types were placed in this encounter.  No orders of the defined types were placed in this encounter.    Procedures: Large Joint Inj: bilateral knee on 09/29/2023 9:25 AM Indications: pain and diagnostic evaluation Details: 1.5 in anterolateral approach  Arthrogram: No  Medications (Right): 30  mg Hyaluronan 30 MG/2ML Medications (Left): 30 mg Hyaluronan 30 MG/2ML Outcome: tolerated well, no immediate complications Procedure, treatment alternatives, risks and benefits explained, specific risks discussed. Consent was given by the patient. Immediately prior to procedure a time out was called to verify the correct patient, procedure, equipment, support staff and site/side marked as required. Patient was prepped and draped in the usual sterile fashion.      Clinical Data: No additional findings.  ROS:  All other systems negative, except as noted in the HPI. Review of Systems  Objective: Vital Signs: There were no vitals taken for this visit.  Specialty Comments:  No specialty comments available.  PMFS History: Patient Active Problem List   Diagnosis Date Noted   Diabetes mellitus (HCC) 08/31/2023   Rash 08/31/2023   Cognitive changes 08/31/2023   Unilateral  primary osteoarthritis, left knee 02/24/2022   Unilateral primary osteoarthritis, right knee 02/24/2022   Abnormal CT of the chest 01/28/2022   History of adenomatous polyp of colon 01/08/2022   Hypokalemia 09/26/2021   Dyspnea 09/25/2021   Acute deep vein thrombosis (DVT) of right lower extremity (HCC) 09/24/2021   OSA (obstructive sleep apnea) 09/24/2021   Herniation of cervical intervertebral disc with radiculopathy    Status post cervical discectomy 08/20/2020   Tracheomalacia 05/26/2019   Acquired tracheomalacia 05/26/2019   HNP (herniated nucleus pulposus) with myelopathy, cervical 05/23/2019    Class: Chronic   Spinal stenosis of cervical region 05/23/2019    Class: Chronic   Status post cervical spinal fusion 05/23/2019   Chronic respiratory failure with hypoxia (HCC) 04/12/2018   Bilateral lower extremity edema 12/29/2017   Hymenoptera allergy 11/10/2017   Family history of colonic polyps 08/07/2017   Diverticulosis 08/07/2017   Flatus 08/07/2017   Myofascial pain 08/06/2017   Seasonal and perennial allergic rhinitis 04/23/2017   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/2017   Cervicalgia 04/01/2017   Recurrent pneumonia 03/09/2017   Other spondylosis with radiculopathy, cervical region 02/09/2017   Depression with anxiety 01/04/2017   Memory difficulty 12/05/2016   Diarrhea in adult patient 12/03/2016   Internal hemorrhoids 12/03/2016   Chronic migraine without aura without status migrainosus, not intractable 10/30/2016   Lumbar radiculopathy 05/15/2016   Osteoarthritis of spine with radiculopathy, lumbar region 05/15/2016   Risk for falls 05/15/2016   Recurrent infections 03/29/2016   Chronic nonseasonal allergic rhinitis due to pollen 03/29/2016   Polypharmacy 01/16/2016   Morbid obesity with BMI of 45.0-49.9, adult (HCC) 03/06/2015   SDAT 02/05/2015   OSA and COPD overlap syndrome (HCC) 02/05/2015   Medication management 08/02/2014   GERD (gastroesophageal reflux disease)  05/09/2014   Vitamin D  deficiency 08/01/2013   Prediabetes 08/01/2013   Positive TB test 07/29/2011   Diverticula of colon 05/07/2011   Hypertension 01/31/2011   Hyperlipidemia, mixed 01/31/2011   BPH (benign prostatic hyperplasia) 01/31/2011   Testosterone  Deficiency 01/31/2011   IBS (irritable bowel syndrome) 01/31/2011   Partial complex seizure disorder with intractable epilepsy (HCC) 01/31/2011   Depression, major, recurrent, in partial remission (HCC) 01/31/2011   Asthma-COPD overlap syndrome (HCC) 01/31/2011   Past Medical History:  Diagnosis Date   Anesthesia complication requiring reversal agent administration    ? from central apnea, very difficult to get off vent   Anxiety    Arthritis    osteo   Asthma    BPH (benign prostatic hyperplasia)    Complication of anesthesia    difficulty waking , they twlight me because of my respiratory problems "   COPD (  chronic obstructive pulmonary disease) (HCC)    COVID    moderate case 2023   Depression    Diabetes mellitus (HCC) 08/31/2023   DVT (deep venous thrombosis) (HCC) 2023   right leg   Dyspnea    on exertion   Enlarged heart    Family history of adverse reaction to anesthesia    mother trouble waking up, and heart stopped   GERD (gastroesophageal reflux disease)    Headache    botox  injections for headaches   Hyperlipidemia    Hypertension    Hypogonadism male    IBS (irritable bowel syndrome)    Memory difficulties    short term memories   Neuropathy    Obesity    On home oxygen  therapy    on 2 liter   OSA (obstructive sleep apnea)    not using cpap   Paralysis (HCC)    left hand - small    due to accident with an head injury   Pneumonia    Pre-diabetes    Prostatitis     Family History  Problem Relation Age of Onset   Dementia Mother    COPD Mother    Hypertension Mother    Diabetes Mother    Cancer Father        lymphoma, colon   Migraines Sister    Prostate cancer Maternal Uncle    Migraines  Paternal Aunt    Diabetes Paternal Uncle    Diabetes Maternal Grandmother    Heart disease Maternal Grandfather    Diabetes Maternal Grandfather    Diabetes Paternal Grandmother    Diabetes Paternal Grandfather    Heart disease Paternal Grandfather    Lung disease Neg Hx    Rheumatologic disease Neg Hx     Past Surgical History:  Procedure Laterality Date   ABDOMINAL SURGERY     ANKLE FRACTURE SURGERY Right    ANTERIOR CERVICAL DECOMP/DISCECTOMY FUSION N/A 05/23/2019   Procedure: ANTERIOR CERVICAL DISCECTOMY FUSION CERVICAL FIVE THROUGH CERVICAL SIX AND CERVICAL SIX THROUGH CERVICAL SEVEN;  Surgeon: Alphonso Jean, MD;  Location: [facility] OR;  Service: Orthopedics;  Laterality: N/A;   BRONCHIAL BRUSHINGS  04/28/2022   Procedure: BRONCHIAL BRUSHINGS;  Surgeon: Denson Flake, MD;  Location: [facility] ENDOSCOPY;  Service: Pulmonary;;   BRONCHIAL NEEDLE ASPIRATION BIOPSY  04/28/2022   Procedure: BRONCHIAL NEEDLE ASPIRATION BIOPSIES;  Surgeon: Denson Flake, MD;  Location: [facility] ENDOSCOPY;  Service: Pulmonary;;   COLONOSCOPY     CYSTOSCOPY     [place]baum   KNEE ARTHROSCOPY WITH MEDIAL MENISECTOMY Left 01/02/2017   Procedure: LEFT KNEE ARTHROSCOPY WITH PARTIAL MEDIAL MENISCECTOMY;  Surgeon: Arnie Lao, MD;  Location: [facility] ORS;  Service: Orthopedics;  Laterality: Left;   POSTERIOR CERVICAL FUSION/FORAMINOTOMY N/A 08/20/2020   Procedure: LEFT CERVICAL SIX THROUGH SEVEN  AND CERVICAL SEVEN THROUGH THORACIC ONE  FORAMINOTOMIES WITH EXCISION OF DISC HERNIATION LEFT CERVICAL SEVEN THROUGH THORACIC ONE;  Surgeon: Alphonso Jean, MD;  Location: [facility] OR;  Service: Orthopedics;  Laterality: N/A;   TONSILLECTOMY     TURBINATE RESECTION  2007   UVULOPALATOPHARYNGOPLASTY     VIDEO BRONCHOSCOPY WITH RADIAL ENDOBRONCHIAL ULTRASOUND  04/28/2022   Procedure: VIDEO BRONCHOSCOPY WITH RADIAL ENDOBRONCHIAL ULTRASOUND;  Surgeon: Denson Flake, MD;  Location: [facility] ENDOSCOPY;  Service: Pulmonary;;   Social History    Occupational History   Occupation: Quarry manager  Tobacco Use   Smoking status: Former    Current packs/day: 0.00    Average packs/day: 0.1  packs/day for 15.0 years (1.5 ttl pk-yrs)    Types: Cigarettes    Start date: 05/26/1968    Quit date: 05/27/1983    Years since quitting: 40.3    Passive exposure: Past   Smokeless tobacco: Never   Tobacco comments:    significant second-hand exposure through mother  Vaping Use   Vaping status: Never Used  Substance and Sexual Activity   Alcohol use: Not Currently    Comment: 2 x a year   Drug use: No   Sexual activity: Never    Birth control/protection: None

## 2023-09-29 NOTE — Progress Notes (Signed)
 09/29/23: Botox  continues to work well for him. Toward the end of the 3 months headaches may increase in the frequency. He states for at least two months he has no headaches.   06/18/23: Botox  continues to work well. Headaches tend to be located in he occipital region and shoulder. Increase in the last couple of weeks since botox  is due. No new medical history or medications.    03/16/23: botox  is working well. Reports on and off neck pain. Reports that orthopedist said that botox  may help.  12/22/22: First botox  with our office. When getting botox  through [facility] it was beneficial for his migraines.   BOTOX  PROCEDURE NOTE FOR MIGRAINE HEADACHE    Contraindications and precautions discussed with patient(above). Aseptic procedure was observed and patient tolerated procedure. Procedure performed by Clem Currier, NP  The condition has existed for more than 6 months, and pt does not have a diagnosis of ALS, Myasthenia Gravis or Lambert-Eaton Syndrome.  Risks and benefits of injections discussed and pt agrees to proceed with the procedure.  Written consent obtained. Reviewed potential side effects.     Indication/Diagnosis: chronic migraine BOTOX ([id number]) injection was performed according to protocol by [employer]. 200 units of BOTOX  was dissolved into 4 cc NS.   NDC: [address]-16  Type of toxin: Botox   Botox - 200 units x 1 vial Lot: [id number] Expiration: 02/2026 NDC: 1093-2355-73   Bacteriostatic 0.9% Sodium Chloride - * mL  Lot: [id number] Expiration: 03/26/2024 NDC: 2706-2376-28   Dx: 43.709       Description of procedure:  The patient was placed in a sitting position. The standard protocol was used for Botox  as follows, with 5 units of Botox  injected at each site:   -Procerus muscle, midline injection  -Corrugator muscle, bilateral injection  -Frontalis muscle, bilateral injection, with 2 sites each side, medial injection was performed in the upper one third of the frontalis  muscle, in the region vertical from the medial inferior edge of the superior orbital rim. The lateral injection was again in the upper one third of the forehead vertically above the lateral limbus of the cornea, 1.5 cm lateral to the medial injection site.  -Temporalis muscle injection, 4 sites, bilaterally. The first injection was 3 cm above the tragus of the ear, second injection site was 1.5 cm to 3 cm up from the first injection site in line with the tragus of the ear. The third injection site was 1.5-3 cm forward between the first 2 injection sites. The fourth injection site was 1.5 cm posterior to the second injection site.  -Occipitalis muscle injection, 3 sites, bilaterally. The first injection was done one half way between the occipital protuberance and the tip of the mastoid process behind the ear. The second injection site was done lateral and superior to the first, 1 fingerbreadth from the first injection. The third injection site was 1 fingerbreadth superiorly and medially from the first injection site.  -Cervical paraspinal muscle injection, 2 sites, bilateral knee first injection site was 1 cm from the midline of the cervical spine, 3 cm inferior to the lower border of the occipital protuberance. The second injection site was 1.5 cm superiorly and laterally to the first injection site.  -Trapezius muscle injection was performed at 3 sites, bilaterally. The first injection site was in the upper trapezius muscle halfway between the inflection point of the neck, and the acromion. The second injection site was one half way between the acromion and the first injection site. The third injection was done between  the first injection site and the inflection point of the neck.   Will return for repeat injection in 3 months.   A 200 units of Botox  was used, 155 units were injected, the rest of the Botox  was wasted. The patient tolerated the procedure well, there were no complications of the above  procedure.  Clem Currier, MSN, NP-C 09/29/2023, 3:01 PM [facility] [address] [place], [place] [address] [phone]

## 2023-09-29 NOTE — Progress Notes (Signed)
 Botox - 200 units x 1 vial Lot: [id number] Expiration: 02/2026 NDC: 5409-8119-14   Bacteriostatic 0.9% Sodium Chloride - * mL  Lot: [id number] Expiration: 03/26/2024 NDC: 2130-8657-84   Dx: 43.709 BB Witnessed by Lissa Riding RN

## 2023-09-30 ENCOUNTER — Encounter: Payer: Self-pay | Admitting: Cardiovascular Disease

## 2023-09-30 ENCOUNTER — Ambulatory Visit: Attending: Cardiovascular Disease | Admitting: Cardiovascular Disease

## 2023-09-30 ENCOUNTER — Encounter (HOSPITAL_BASED_OUTPATIENT_CLINIC_OR_DEPARTMENT_OTHER): Payer: Self-pay | Admitting: Family Medicine

## 2023-09-30 VITALS — BP 179/110 | HR 76 | Ht 72.0 in | Wt 364.8 lb

## 2023-09-30 DIAGNOSIS — E782 Mixed hyperlipidemia: Secondary | ICD-10-CM

## 2023-09-30 DIAGNOSIS — E876 Hypokalemia: Secondary | ICD-10-CM

## 2023-09-30 DIAGNOSIS — I5033 Acute on chronic diastolic (congestive) heart failure: Secondary | ICD-10-CM | POA: Diagnosis not present

## 2023-09-30 DIAGNOSIS — G4733 Obstructive sleep apnea (adult) (pediatric): Secondary | ICD-10-CM | POA: Diagnosis not present

## 2023-09-30 DIAGNOSIS — J449 Chronic obstructive pulmonary disease, unspecified: Secondary | ICD-10-CM

## 2023-09-30 DIAGNOSIS — I10 Essential (primary) hypertension: Secondary | ICD-10-CM

## 2023-09-30 MED ORDER — HYDRALAZINE HCL 25 MG PO TABS: 25.0000 mg | ORAL_TABLET | Freq: Three times a day (TID) | ORAL | 3 refills | Status: DC

## 2023-09-30 MED ORDER — FUROSEMIDE 20 MG PO TABS: ORAL_TABLET | ORAL | 3 refills | Status: DC

## 2023-09-30 MED ORDER — PREGABALIN 150 MG PO CAPS: ORAL_CAPSULE | ORAL | 1 refills | Status: DC

## 2023-09-30 MED ORDER — POTASSIUM CHLORIDE CRYS ER 20 MEQ PO TBCR
20.0000 meq | EXTENDED_RELEASE_TABLET | Freq: Every day | ORAL | 3 refills | Status: AC
Start: 2023-09-30 — End: ?

## 2023-09-30 NOTE — Assessment & Plan Note (Signed)
 History of essential hypertension with blood pressure measured today at 170/110.  He says he checks his blood pressure at home and it is in the 160/85 range.  He is on losartan  and metoprolol .  I am going to add hydralazine  25 mg p.o. 3 times daily and asked him to keep a 30 blood pressure log.  He will return to see either a Pharm.D. or APP to review and make appropriate adjustments.

## 2023-09-30 NOTE — Assessment & Plan Note (Signed)
 2D echo performed 09/25/2021 revealed normal LV systolic function with grade 1 diastolic dysfunction.  He has noticed progressive lower extremity edema since recently been diagnosed with a kidney stone.  He is aware salt avoidance.  I am going to start him on furosemide  40 mg a day and K-Dur 20 mill equivalents for 1 week after which we will go down the 20 mg a day.  I am going to check a baseline basic metabolic panel now and again 2 weeks after starting his furosemide .

## 2023-09-30 NOTE — Assessment & Plan Note (Signed)
 History of obstructive sleep apnea intolerant to CPAP.  He says he gets "night terrors".

## 2023-09-30 NOTE — Progress Notes (Unsigned)
 09/30/2023 Mark Harrell.   May 23, 1961  [id number]  Primary Physician de [place], Raymond J, MD Primary Cardiologist: Avanell Leigh MD Lathan Harrell, [place], [place]  HPI:  Mark Harrell. is a 63 y.o.  morbidly overweight single Caucasian male who children referred by Dr. Cassondra Cliff for cardiovascular evaluation because of hypertension, cardiac enlargement and lower extremity edema.  I last saw him in the office 10/17/2021.  He has no prior cardiac history. His risk factors include treated hypertension and hyperlipidemia. There is no family history. He's never had a heart attack or stroke. He denies chest pain but does get dyspneic on exertion probably related to his body habitus and deconditioning. He also has chronic mild lower extremity edema probably related to his obesity as well. He's been told that he has cardiac enlargement in the past. A 2D echocardiogram performed 02/23/2017 revealed normal LV size and function.  He does have obstructive sleep apnea currently not on CPAP.  He is also had an episode of chest pain which resulted in ER eval with negative troponins recently and has had several less intense episodes since that time which has been attributed to esophageal spasm or GERD.  Unfortunately, his body size and habitus preclude noninvasive evaluation.   Since I saw him in the office in the office 2 years ago he is remained stable.  His weight has remained the same.  He did try CPAP but was intolerant to this because of night terrors.  His blood pressure has become more difficult to control since he had COVID back in 21/22.  He is developed increasing lower extremity edema over the last several weeks to months.   Current Meds  Medication Sig   acetaminophen  (TYLENOL ) 500 MG tablet Take 1,000 mg by mouth 3 (three) times daily.   albuterol  (VENTOLIN  HFA) 108 (90 Base) MCG/ACT inhaler Inhale 1-2 puffs into the lungs every 6 (six) hours as needed for wheezing or shortness of breath.    anastrozole  (ARIMIDEX ) 1 MG tablet Take 1 mg by mouth daily.   ascorbic acid  (VITAMIN C) 1000 MG tablet Take 1,000 mg by mouth every evening.   botulinum toxin Type A  (BOTOX ) 200 units injection Provider to inject 155 units into the muscles of the head and neck every 12 weeks. Discard remainder.   budesonide  (PULMICORT ) 0.5 MG/2ML nebulizer solution INHALE CONTENTS OF 1 VIAL VIA NEBULIZER 2 (TWO) TIMES DAILY AS NEEDED.   Calcium  Carb-Cholecalciferol  (CALCIUM  + VITAMIN D3 PO) Take 1 tablet by mouth daily.   celecoxib  (CELEBREX ) 200 MG capsule TAKE 1 CAPSULE (200 MG TOTAL) BY MOUTH EVERY 12 (TWELVE) HOURS.   cetaphil (CETAPHIL) lotion Apply 1 Application topically 2 (two) times daily.   cetirizine  (ZYRTEC ) 10 MG tablet Take 1 tablet (10 mg total) by mouth daily as needed for allergies.   Cholecalciferol  (VITAMIN D3) 125 MCG (5000 UT) TABS Take 5,000-10,000 Units by mouth See admin instructions. Take 10,000 in the morning and 5,000 units in the evening   Cinnamon  500 MG capsule Take 1,000 mg by mouth 3 (three) times daily.   Coenzyme Q10 (CO Q 10 PO) Take 600 mg by mouth daily.   cyclobenzaprine  (FLEXERIL ) 10 MG tablet TAKE 1 TABLET 3 TIMES A DAY ONLY IF NEEDED FOR MUSCLE SPASM (NECK)   Dextromethorphan HBr (DELSYM PO) Take by mouth.   diclofenac  Sodium (VOLTAREN ) 1 % GEL Apply 2-4 g topically 2 (two) times daily as needed (pain).   DM-APAP-CPM (CORICIDIN HBP PO) Take  by mouth.   donepezil  (ARICEPT ) 23 MG TABS tablet TAKE 1 TABLET DAILY FOR MEMORY   dupilumab  (DUPIXENT ) 300 MG/2ML prefilled syringe Inject 600 mg into the skin once for 1 dose. THEN 300MG  EVERY 14 DAYS   EPINEPHrine  0.3 mg/0.3 mL IJ SOAJ injection Inject 0.3 mg into the muscle as needed for anaphylaxis.   Guaifenesin  1200 MG TB12 Take 1,200 mg by mouth 3 (three) times daily.   ipratropium (ATROVENT ) 0.03 % nasal spray Place 2 sprays into the nose 3 (three) times daily as needed for rhinitis.   ipratropium-albuterol  (DUONEB) 0.5-2.5  (3) MG/3ML SOLN Take 3 mLs by nebulization 2 (two) times daily as needed.   Krill Oil 500 MG CAPS Take 500 mg by mouth 2 (two) times daily.   losartan  (COZAAR ) 100 MG tablet Take 1 tablet (100 mg total) by mouth daily.   Magnesium  500 MG CAPS Take 500 mg by mouth 3 (three) times daily.   memantine  (NAMENDA ) 10 MG tablet Take 1 tablet (10 mg total) by mouth 2 (two) times daily.   metFORMIN  (GLUCOPHAGE -XR) 500 MG 24 hr tablet TAKES 2 TABLETS TWICE A DAY WITH MEALS FOR DIABETES   metoprolol  tartrate (LOPRESSOR ) 25 MG tablet TAKE 1 TABLET (25 MG TOTAL) BY MOUTH IN THE MORNING, AT NOON, AND AT BEDTIME.   Misc Natural Products (GLUCOS-CHONDROIT-MSM COMPLEX) TABS Take 2 tablets by mouth 3 (three) times daily.   montelukast  (SINGULAIR ) 10 MG tablet Take 1 tablet (10 mg total) by mouth at bedtime.   Multiple Vitamins-Minerals (MULTIVITAMIN WITH MINERALS) tablet Take 1 tablet by mouth every morning.   Multiple Vitamins-Minerals (PRESERVISION AREDS 2) CAPS Take 1 capsule by mouth 2 (two) times daily. Evening and night   Olopatadine  HCl 0.6 % SOLN Place 2 sprays into the nose 2 (two) times daily as needed.   oxyCODONE  (OXY IR/ROXICODONE ) 5 MG immediate release tablet Take 5 mg by mouth every 6 (six) hours as needed.   oxymetazoline  (AFRIN) 0.05 % nasal spray Place 1 spray into both nostrils at bedtime.   pentosan polysulfate (ELMIRON ) 100 MG capsule Take 100 mg by mouth 3 (three) times daily.   potassium chloride  SA (KLOR-CON  M20) 20 MEQ tablet Take 1 tablet (20 mEq total) by mouth daily.   pravastatin  (PRAVACHOL ) 40 MG tablet TAKE 1 TABLET BY MOUTH EVERY DAY AT BEDTIME FOR CHOLESTEROL   promethazine -dextromethorphan (PROMETHAZINE -DM) 6.25-15 MG/5ML syrup TAKE 5 MLS BY MOUTH 4 TIMES A DAY AS NEEDED FOR COUGH   sertraline  (ZOLOFT ) 100 MG tablet TAKE 2 TABLETS DAILY FOR MOOD / DEPRESSION   sodium chloride  (OCEAN) 0.65 % SOLN nasal spray Place 1 spray into both nostrils 4 (four) times daily as needed for  congestion. Uses each time before other nasal sprays   tadalafil  (CIALIS ) 5 MG tablet Take 5 mg by mouth daily.    tamsulosin (FLOMAX) 0.4 MG CAPS capsule Take 0.4 mg by mouth at bedtime.   tirzepatide  (MOUNJARO ) 5 MG/0.5ML Pen Inject 5 mg into the skin once a week.   traZODone  (DESYREL ) 50 MG tablet TAKE 1 TABLET BY MOUTH EVERYDAY AT BEDTIME   TRELEGY ELLIPTA  100-62.5-25 MCG/ACT AEPB Inhale 1 puff into the lungs daily.   triamcinolone  ointment (KENALOG ) 0.1 % Apply 1 Application topically 2 (two) times daily.   trospium (SANCTURA) 20 MG tablet Take 20 mg by mouth 2 (two) times daily. In the evening and at bedtime   TURMERIC PO Take 2,000 mg by mouth 3 (three) times daily. 1000 mg each   zinc   gluconate 50 MG tablet Take 50 mg by mouth 3 (three) times daily.   [DISCONTINUED] pregabalin  (LYRICA ) 150 MG capsule TAKE 1 CAPSULE 3 TIMES A DAY FOR CHRONIC PAIN     Allergies  Allergen Reactions   Bee Venom Swelling   Cymbalta [Duloxetine Hcl] Shortness Of Breath    Brought on asthma   Ppd [Tuberculin Purified Protein Derivative] Other (See Comments)    +ppd NEG Quantferron Gold 3/13 (shows false positive)    Calan [Verapamil] Other (See Comments)    Back pain   Tricor [Fenofibrate] Other (See Comments)    Back pain   Claritin  [Loratadine ] Other (See Comments)    Unknown reaction    Levaquin  [Levofloxacin ] Diarrhea   Tuberculin, Ppd Other (See Comments)    +ppd NEG Quantferron Gold 3/13    Social History   Socioeconomic History   Marital status: Single    Spouse name: Not on file   Number of children: Not on file   Years of education: Not on file   Highest education level: Master's degree (e.g., MA, MS, MEng, MEd, MSW, MBA)  Occupational History   Occupation: Quarry manager  Tobacco Use   Smoking status: Former    Current packs/day: 0.00    Average packs/day: 0.1 packs/day for 15.0 years (1.5 ttl pk-yrs)    Types: Cigarettes    Start date: 05/26/1968    Quit date: 05/27/1983     Years since quitting: 40.3    Passive exposure: Past   Smokeless tobacco: Never   Tobacco comments:    significant second-hand exposure through mother  Vaping Use   Vaping status: Never Used  Substance and Sexual Activity   Alcohol use: Not Currently    Comment: 2 x a year   Drug use: No   Sexual activity: Never    Birth control/protection: None  Other Topics Concern   Not on file  Social History Narrative   [facility]:   Originally from [place]. Previously has lived in [place] & [place]. He has lived in [place], [place], & [place]. He has worked in Artist. No pets currently. 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 No mold, asbestos, or hot tub exposure.    Social Drivers of Corporate investment banker Strain: Low Risk  (08/31/2023)   Overall Financial Resource Strain (CARDIA)    Difficulty of Paying Living Expenses: Not hard at all  Food Insecurity: No Food Insecurity (08/31/2023)   Hunger Vital Sign    Worried About Running Out of Food in the Last Year: Never true    Ran Out of Food in the Last Year: Never true  Transportation Needs: No Transportation Needs (08/31/2023)   PRAPARE - Administrator, Civil Service (Medical): No    Lack of Transportation (Non-Medical): No  Physical Activity: Unknown (08/31/2023)   Exercise Vital Sign    Days of Exercise per Week: 0 days    Minutes of Exercise per Session: Not on file  Stress: Stress Concern Present (08/31/2023)   [employer] of Occupational Health - Occupational Stress Questionnaire    Feeling of Stress : Very much  Social Connections: Moderately Isolated (08/31/2023)   Social Connection and Isolation Panel [NHANES]    Frequency of Communication with Friends and Family: Never    Frequency of Social Gatherings with Friends and Family: More than three times a week    Attends Religious Services: 1 to 4 times per year    Active Member of  Clubs or Organizations: No    Attends Tax inspector Meetings: Not on file    Marital Status: Never married  Intimate Partner Violence: Not At Risk (09/19/2022)   Received from [facility], [facility]   HITS    Over the last 12 months how often did your partner physically hurt you?: Never    Over the last 12 months how often did your partner insult you or talk down to you?: Never    Over the last 12 months how often did your partner threaten you with physical harm?: Never    Over the last 12 months how often did your partner scream or curse at you?: Never     Review of Systems: General: negative for chills, fever, night sweats or weight changes.  Cardiovascular: negative for chest pain, dyspnea on exertion, edema, orthopnea, palpitations, paroxysmal nocturnal dyspnea or shortness of breath Dermatological: negative for rash Respiratory: negative for cough or wheezing Urologic: negative for hematuria Abdominal: negative for nausea, vomiting, diarrhea, bright red blood per rectum, melena, or hematemesis Neurologic: negative for visual changes, syncope, or dizziness All other systems reviewed and are otherwise negative except as noted above.    Blood pressure (!) 179/110, pulse 76, height 6' (1.829 m), weight (!) 364 lb 12.8 oz (165.5 kg), SpO2 98%.  General appearance: alert and no distress Neck: no adenopathy, no carotid bruit, no JVD, supple, symmetrical, trachea midline, and thyroid  not enlarged, symmetric, no tenderness/mass/nodules Lungs: clear to auscultation bilaterally Heart: regular rate and rhythm, S1, S2 normal, no murmur, click, rub or gallop Extremities: 2+ bilateral lower extremity edema Pulses: 2+ and symmetric Skin: Skin color, texture, turgor normal. No rashes or lesions Neurologic: Grossly normal  EKG EKG Interpretation Date/Time:  Wednesday Sep 30 2023 16:20:12 EDT Ventricular Rate:  79 PR Interval:  196 QRS Duration:  100 QT Interval:  382 QTC Calculation: 438 R Axis:   -36  Text  Interpretation: Normal sinus rhythm Left axis deviation Cannot rule out Anterior infarct , age undetermined When compared with ECG of 03-Oct-2021 21:10, PREVIOUS ECG IS PRESENT Confirmed by Lauro Portal [phone]) on 09/30/2023 4:43:29 PM    ASSESSMENT AND PLAN:   Hypertension History of essential hypertension with blood pressure measured today at 170/110.  He says he checks his blood pressure at home and it is in the 160/85 range.  He is on losartan  and metoprolol .  I am going to add hydralazine  25 mg p.o. 3 times daily and asked him to keep a 30 blood pressure log.  He will return to see either a Pharm.D. or APP to review and make appropriate adjustments.  Hyperlipidemia, mixed History of hyperlipidemia on pravastatin  with lipid profile performed 12//24 revealing total cholesterol 154, LDL of 83 and HDL 34.  OSA and COPD overlap syndrome (HCC) History of obstructive sleep apnea intolerant to CPAP.  He says he gets "night terrors".  Acute on chronic diastolic heart failure (HCC) 2D echo performed 09/25/2021 revealed normal LV systolic function with grade 1 diastolic dysfunction.  He has noticed progressive lower extremity edema since recently been diagnosed with a kidney stone.  He is aware salt avoidance.  I am going to start him on furosemide  40 mg a day and K-Dur 20 mill equivalents for 1 week after which we will go down the 20 mg a day.  I am going to check a baseline basic metabolic panel now and again 2 weeks after starting his furosemide .     Avanell Leigh MD  Bennye Bravo, FSCAI 09/30/2023 4:58 PM

## 2023-09-30 NOTE — Assessment & Plan Note (Signed)
 History of hyperlipidemia on pravastatin  with lipid profile performed 12//24 revealing total cholesterol 154, LDL of 83 and HDL 34.

## 2023-09-30 NOTE — Patient Instructions (Signed)
 Medication Instructions:  Your physician has recommended you make the following change in your medication:   -Start taking hydralazine  (apresoline ) 25mg  three times a day.  -Start taking furosemide  (lasix ) 40mg  (2 tablets) for 7 days, then take 20mg  daily.   *If you need a refill on your cardiac medications before your next appointment, please call your pharmacy*  Lab Work: Your physician recommends that you return for lab work in: 2 weeks for BMET  If you have labs (blood work) drawn today and your tests are completely normal, you will receive your results only by: MyChart Message (if you have MyChart) OR A paper copy in the mail If you have any lab test that is abnormal or we need to change your treatment, we will call you to review the results.  Testing/Procedures: Your physician has requested that you have an echocardiogram. Echocardiography is a painless test that uses sound waves to create images of your heart. It provides your doctor with information about the size and shape of your heart and how well your heart's chambers and valves are working. This procedure takes approximately one hour. There are no restrictions for this procedure. Please do NOT wear cologne, perfume, aftershave, or lotions (deodorant is allowed). Please arrive 15 minutes prior to your appointment time.  Please note: We ask at that you not bring children with you during ultrasound (echo/ vascular) testing. Due to room size and safety concerns, children are not allowed in the ultrasound rooms during exams. Our front office staff cannot provide observation of children in our lobby area while testing is being conducted. An adult accompanying a patient to their appointment will only be allowed in the ultrasound room at the discretion of the ultrasound technician under special circumstances. We apologize for any inconvenience.   Follow-Up: At [facility], you and your health needs are our priority.  As part  of our continuing mission to provide you with exceptional heart care, our providers are all part of one team.  This team includes your primary Cardiologist (physician) and Advanced Practice Providers or APPs (Physician Assistants and Nurse Practitioners) who all work together to provide you with the care you need, when you need it.  Your next appointment:   6 month(s)  Provider:   Marcie Sever, PA-C, Palmer Bobo, NP, Sharren Decree, PA-C, Hao Meng, PA-C, Marlana Silvan, NP, or Katlyn West, NP         Then, Lauro Portal, MD will plan to see you again in 12 month(s).     We recommend signing up for the patient portal called "MyChart".  Sign up information is provided on this After Visit Summary.  MyChart is used to connect with patients for Virtual Visits (Telemedicine).  Patients are able to view lab/test results, encounter notes, upcoming appointments, etc.  Non-urgent messages can be sent to your provider as well.   To learn more about what you can do with MyChart, go to [email or web address].   Other Instructions Dr. Katheryne Pane has requested that you schedule an appointment with one of our clinical pharmacists for a blood pressure check appointment within the next 6-8 weeks.  If you monitor your blood pressure (BP) at home, please bring your BP cuff and your BP readings with you to this appointment  HOW TO TAKE YOUR BLOOD PRESSURE: Rest 5 minutes before taking your blood pressure. Don't smoke or drink caffeinated beverages for at least 30 minutes before. Take your blood pressure before (not after) you eat. Sit comfortably with your back  supported and both feet on the floor (don't cross your legs). Elevate your arm to heart level on a table or a desk. Use the proper sized cuff. It should fit smoothly and snugly around your bare upper arm. There should be enough room to slip a fingertip under the cuff. The bottom edge of the cuff should be 1 inch above the crease of the elbow. Ideally,  take 3 measurements at one sitting and record the average.

## 2023-10-05 ENCOUNTER — Encounter (HOSPITAL_BASED_OUTPATIENT_CLINIC_OR_DEPARTMENT_OTHER): Payer: Self-pay | Admitting: *Deleted

## 2023-10-07 ENCOUNTER — Other Ambulatory Visit: Payer: Self-pay

## 2023-10-07 NOTE — Progress Notes (Signed)
 Specialty Pharmacy Refill Coordination Note  Mark Harrell. is a 63 y.o. male contacted today regarding refills of specialty medication(s) Dupilumab  (Dupixent )   Patient requested (Patient-Rptd) Delivery   Delivery date: (Patient-Rptd) 10/08/23   Verified address: (Patient-Rptd) [address], [place], [place]. [address]   Medication will be filled on 05.14.25.   attempted to reach out to patient regarding reported injection due date but no answer and VM full. does not match with previous flowsheets and reported injection dates.

## 2023-10-13 DIAGNOSIS — K640 First degree hemorrhoids: Secondary | ICD-10-CM | POA: Diagnosis not present

## 2023-10-15 ENCOUNTER — Ambulatory Visit: Payer: BC Managed Care – PPO | Admitting: Allergy & Immunology

## 2023-10-15 ENCOUNTER — Other Ambulatory Visit: Payer: Self-pay

## 2023-10-15 ENCOUNTER — Encounter: Payer: Self-pay | Admitting: Allergy & Immunology

## 2023-10-15 VITALS — BP 130/78 | HR 92 | Temp 98.4°F | Resp 19 | Ht 72.0 in | Wt 358.6 lb

## 2023-10-15 DIAGNOSIS — B999 Unspecified infectious disease: Secondary | ICD-10-CM

## 2023-10-15 DIAGNOSIS — K219 Gastro-esophageal reflux disease without esophagitis: Secondary | ICD-10-CM

## 2023-10-15 DIAGNOSIS — J3089 Other allergic rhinitis: Secondary | ICD-10-CM | POA: Diagnosis not present

## 2023-10-15 DIAGNOSIS — R918 Other nonspecific abnormal finding of lung field: Secondary | ICD-10-CM

## 2023-10-15 DIAGNOSIS — L2084 Intrinsic (allergic) eczema: Secondary | ICD-10-CM

## 2023-10-15 DIAGNOSIS — J4489 Other specified chronic obstructive pulmonary disease: Secondary | ICD-10-CM | POA: Diagnosis not present

## 2023-10-15 DIAGNOSIS — J302 Other seasonal allergic rhinitis: Secondary | ICD-10-CM

## 2023-10-15 NOTE — Progress Notes (Signed)
 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 - seems to be stable at this point on doxycycline  BID   Asthma-COPD overlap syndrome with worsening pulmonary status since contracting COVID pneumonia in January 2023   Previously doing well on Nucala  and then Dupixent  (stopped January 2023 after his COVID PNA, but now back on Dupixent ) - latest AEC 227 in December 2024   Bilateral pulmonary nodules - benign biopsies in December 2023   Hymenoptera allergy - EpiPen  up to date   GERD - on PRN H2 blocker    Obesity - with resulting nerve impingement and joint pain   Chronic back pain - s/p recent neck surgery   Migraines - on botulinum injections   Polypharmacy   Normal echocardiogram (May 2023)   Recent passing of his mother  Plan/Recommendations:   1. Recurrent infections - with isolated low IgG and a B cell memory defect - on prophylactic antibiotic  - Controlled on prophylactic antibiotics, no recent infection - Continue with doxycycline  100mg  twice daily for now. - Let's hold off on the immunoglobulin replacement and restart the Dupixent  instead.   2. Asthma-COPD overlap syndrome - I am glad that the Dupixent  is back on board.  - Lung testing looks much better. - We are not going to make any medication changes. - Daily controller medication(s): Trelegy one puff once daily and Singulair  10mg  daily and Dupixent  every two weeks - Prior to physical activity: albuterol  2 puffs 10-15 minutes before physical activity - Rescue medications: albuterol  1-2 puffs every 4-6 hours as needed or DuoNeb nebulizer one vial every 4-6 hours as needed - Asthma control goals:  * Full participation in all desired activities (may need albuterol  before activity) * Albuterol  use two time or less  a week on average (not counting use with activity) * Cough interfering with sleep two time or less a month * Oral steroids no more than once a year * No hospitalizations   3. Allergic rhinitis (sweet vernal grass, box elder, cat, weeds, ragweed, molds, cockroach, dust mite) - Continue with saline mist 1-2 times daily.  - Stop the Azelastine  and start Patanase  two sprays per nostril twice daily. - Continue with Ipratropium one spray per nostril every 8 hours as needed for runny noses.  - Continue with Cetirizine  10mg  daily as needed for runny nose, sneezing, itchy watery eyes.  - Continue with Singulair /Montelukast  10mg  in the evening.   4. GERD - Continue with heartburn medications as needed.   5. Return in about 3 months (around 01/15/2024). You can have the follow up appointment with Dr. Idolina Maker or a Nurse Practicioner (our Nurse Practitioners are excellent and always have Physician oversight!).    Subjective:   Mark Wah. is a 63 y.o. male presenting today for follow up of  Chief Complaint  Patient presents with   Follow-up    Patients been having a rough time with allergies.    Angioedema    Patents said roughly the past four weeks he been having issues with edema.     Mark Harrell. has a history of the following: Patient Active Problem List   Diagnosis Date Noted   Acute on chronic diastolic heart failure (HCC) 09/30/2023   Diabetes mellitus (HCC) 08/31/2023   Rash 08/31/2023   Cognitive  changes 08/31/2023   Unilateral primary osteoarthritis, left knee 02/24/2022   Unilateral primary osteoarthritis, right knee 02/24/2022   Abnormal CT of the chest 01/28/2022   History of adenomatous polyp of colon 01/08/2022   Hypokalemia 09/26/2021   Dyspnea 09/25/2021   Acute deep vein thrombosis (DVT) of right lower extremity (HCC) 09/24/2021   OSA (obstructive sleep apnea) 09/24/2021   Herniation of cervical intervertebral disc with radiculopathy    Status post cervical  discectomy 08/20/2020   Tracheomalacia 05/26/2019   Acquired tracheomalacia 05/26/2019   HNP (herniated nucleus pulposus) with myelopathy, cervical 05/23/2019    Class: Chronic   Spinal stenosis of cervical region 05/23/2019    Class: Chronic   Status post cervical spinal fusion 05/23/2019   Chronic respiratory failure with hypoxia (HCC) 04/12/2018   Bilateral lower extremity edema 12/29/2017   Hymenoptera allergy 11/10/2017   Family history of colonic polyps 08/07/2017   Diverticulosis 08/07/2017   Flatus 08/07/2017   Myofascial pain 08/06/2017   Seasonal and perennial allergic rhinitis 04/23/2017   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/2017   Cervicalgia 04/01/2017   Recurrent pneumonia 03/09/2017   Other spondylosis with radiculopathy, cervical region 02/09/2017   Depression with anxiety 01/04/2017   Memory difficulty 12/05/2016   Diarrhea in adult patient 12/03/2016   Internal hemorrhoids 12/03/2016   Chronic migraine without aura without status migrainosus, not intractable 10/30/2016   Lumbar radiculopathy 05/15/2016   Osteoarthritis of spine with radiculopathy, lumbar region 05/15/2016   Risk for falls 05/15/2016   Recurrent infections 03/29/2016   Chronic nonseasonal allergic rhinitis due to pollen 03/29/2016   Polypharmacy 01/16/2016   Morbid obesity with BMI of 45.0-49.9, adult (HCC) 03/06/2015   SDAT 02/05/2015   OSA and COPD overlap syndrome (HCC) 02/05/2015   Medication management 08/02/2014   GERD (gastroesophageal reflux disease) 05/09/2014   Vitamin D  deficiency 08/01/2013   Prediabetes 08/01/2013   Positive TB test 07/29/2011   Diverticula of colon 05/07/2011   Hypertension 01/31/2011   Hyperlipidemia, mixed 01/31/2011   BPH (benign prostatic hyperplasia) 01/31/2011   Testosterone  Deficiency 01/31/2011   IBS (irritable bowel syndrome) 01/31/2011   Partial complex seizure disorder with intractable epilepsy (HCC) 01/31/2011   Depression, major, recurrent, in partial  remission (HCC) 01/31/2011   Asthma-COPD overlap syndrome (HCC) 01/31/2011    History obtained from: chart review and patient.  Discussed the use of AI scribe software for clinical note transcription with the patient and/or guardian, who gave verbal consent to proceed.  Eron is a 63 y.o. male presenting for a follow up visit.  He was last seen in February 2025.  At that time, we continue with doxycycline  100 mg twice daily as a prophylactic antibiotic.  For his breathing, his lung testing was getting lower.  We continue with Trelegy 1 puff daily and Singulair  10 mg daily.  For his rhinitis, we continue with saline mist 1-2 times a day.  We stopped Astelin  and started Patanase  2 sprays per nostril twice daily.  We also continue with cetirizine  and montelukast .  Since last visit, he has been about the same.   Asthma/Respiratory Symptom History: He did start Dupixent . He is doing that every two weeks at home. He has a history of asthma and has recently started on Dupixent . He finds it difficult to assess its effectiveness due to concurrent health issues. He experiences episodes of severe breathing difficulty, particularly when edema is high, and notes that his oxygen  saturation drops to the mid-eighties to low-nineties when at rest without supplemental oxygen . He  does not typically travel with oxygen .  He apparently has an oxygen  concentrator at home, but the new one is very loud and does not work as well.  He might just buy his own so that he can get 1 that he likes.  He remains on the CPAP for his obstructive sleep apnea.  He has a history of diastolic heart failure and recently had an echocardiogram that indicated an anterior infarction.  However, the echocardiogram from May 7 has not been read yet.  He recalls a specific incident where he experienced severe chest pain and difficulty breathing, which he initially thought was a panic attack. He has been on furosemide , which initially helped him lose  water weight, but he has since regained some weight and experiences edema, particularly in his legs.    He has not seen a cardiologist since the echocardiogram came through.  His last cardiology appointment was at the beginning of May 2025.  At that time, he was started on hydralazine  25 mg 3 times a day as well as furosemide  40 mg for 7 days and then 20 mg daily thereafter.  He had an echocardiogram after this.  He was seen by Dr. Lauro Portal.  Chest CT (March 2025)  1. No substantial interval change in the bilateral pulmonary nodules. The largest is in the right lower lobe paraspinal region measuring 3.0 x 1.7 cm.  2. Enlargement of the pulmonary outflow tract/main pulmonary arteries suggests pulmonary arterial hypertension. 3. Hepatic steatosis.  Allergic Rhinitis Symptom History: He has been using allergy medications including Allegra and Xyzal, which have helped manage his symptoms, though he experiences some breakthrough symptoms. Stopping these medications results in nightly allergy symptoms.  He does need some refills for this.  Skin Symptom History: He has a history of skin issues, including serious skin breakdowns, which he attributes to urological problems.  He has not seen a dermatologist for this.  He does have a history of some eczema which always gets better when he is back on the Dupixent .  He does not need any refills of any topical treatments.  Infection Symptom History: He remains on doxycycline  1 tablet twice a day.  This seems to be keeping all of his infections at bay.  We have never had him on immunoglobulin replacement.  He recently experienced kidney stones for the first time, which he attributes to dietary changes (he has been eating more nuts and healthier foods). He describes the pain as 'unreal' and has been adjusting his diet to manage this condition.  He is currently taking hydralazine  for blood pressure management and has been on furosemide  for fluid management.   He was on Lasix  several years ago, but recently had to restart it because of fluid buildup in his legs.   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  He is working on getting his affairs in order because he he said although his uncle has Alzheimer's, he otherwise has minimal health problems and he is afraid that he will live as long as his uncle and his uncle needs to be cared for.  Otherwise, there have been no changes to his past medical history, surgical history, family history, or social history.    Review of systems otherwise negative other than that mentioned in the HPI.    Objective:   Blood pressure 130/78, pulse 92, temperature 98.4 F (36.9 C), temperature source Temporal, resp. rate 19, height 6' (1.829 m), weight (!) 358  lb 9.6 oz (162.7 kg), SpO2 95%. Body mass index is 48.63 kg/m.    Physical Exam Vitals reviewed.  Constitutional:      Appearance: He is well-developed. He is morbidly obese. He is not ill-appearing or toxic-appearing.     Comments: He seems more energetic today.  HENT:     Head: Normocephalic and atraumatic.     Right Ear: Tympanic membrane, ear canal and external ear normal.     Left Ear: Tympanic membrane, ear canal and external ear normal.     Nose: No nasal deformity, septal deviation, mucosal edema or rhinorrhea.     Right Turbinates: Enlarged, swollen and pale.     Left Turbinates: Enlarged, swollen and pale.     Right Sinus: No maxillary sinus tenderness or frontal sinus tenderness.     Left Sinus: No maxillary sinus tenderness or frontal sinus tenderness.     Comments: No nasal polyps.     Mouth/Throat:     Lips: Pink.     Mouth: Mucous membranes are moist. Mucous membranes are not pale and not dry.     Pharynx: Uvula midline.  Eyes:     General: Lids are normal. Allergic shiner present.        Right eye: No discharge.        Left eye: No discharge.     Conjunctiva/sclera: Conjunctivae  normal.     Right eye: Right conjunctiva is not injected. No chemosis.    Left eye: Left conjunctiva is not injected. No chemosis.    Pupils: Pupils are equal, round, and reactive to light.  Cardiovascular:     Rate and Rhythm: Normal rate and regular rhythm.     Heart sounds: Normal heart sounds.  Pulmonary:     Effort: Pulmonary effort is normal. No tachypnea, accessory muscle usage or respiratory distress.     Breath sounds: Normal breath sounds. Decreased air movement and transmitted upper airway sounds present. No wheezing, rhonchi or rales.     Comments: Decreased air movement at the bases.  Chest:     Chest wall: No tenderness.  Lymphadenopathy:     Cervical: No cervical adenopathy.  Skin:    Coloration: Skin is not pale.     Findings: No abrasion, erythema, petechiae or rash. Rash is not papular, urticarial or vesicular.  Neurological:     Mental Status: He is alert.  Psychiatric:        Behavior: Behavior is cooperative.      Diagnostic studies:    Spirometry: results normal (FEV1: 2.08/56%, FVC: 3.23/67%, FEV1/FVC: 64%).    Spirometry consistent with possible restrictive disease.    Allergy Studies: none        Drexel Gentles, MD  [facility] 

## 2023-10-15 NOTE — Patient Instructions (Addendum)
 1. Recurrent infections - with isolated low IgG and a B cell memory defect - on prophylactic antibiotic  - Controlled on prophylactic antibiotics, no recent infection - Continue with doxycycline  100mg  twice daily for now. - Let's hold off on the immunoglobulin replacement and restart the Dupixent  instead.   2. Asthma-COPD overlap syndrome - I am glad that the Dupixent  is back on board.  - Lung testing looks much better. - We are not going to make any medication changes. - Daily controller medication(s): Trelegy one puff once daily and Singulair  10mg  daily and Dupixent  every two weeks - Prior to physical activity: albuterol  2 puffs 10-15 minutes before physical activity - Rescue medications: albuterol  1-2 puffs every 4-6 hours as needed or DuoNeb nebulizer one vial every 4-6 hours as needed - Asthma control goals:  * Full participation in all desired activities (may need albuterol  before activity) * Albuterol  use two time or less a week on average (not counting use with activity) * Cough interfering with sleep two time or less a month * Oral steroids no more than once a year * No hospitalizations   3. Allergic rhinitis (sweet vernal grass, box elder, cat, weeds, ragweed, molds, cockroach, dust mite) - Continue with saline mist 1-2 times daily.  - Stop the Azelastine  and start Patanase  two sprays per nostril twice daily. - Continue with Ipratropium one spray per nostril every 8 hours as needed for runny noses.  - Continue with Cetirizine  10mg  daily as needed for runny nose, sneezing, itchy watery eyes.  - Continue with Singulair /Montelukast  10mg  in the evening.   4. GERD - Continue with heartburn medications as needed.   5. Return in about 3 months (around 01/15/2024). You can have the follow up appointment with Dr. Idolina Maker or a Nurse Practicioner (our Nurse Practitioners are excellent and always have Physician oversight!).    Please inform us  of any Emergency Department visits,  hospitalizations, or changes in symptoms. Call us  before going to the ED for breathing or allergy symptoms since we might be able to fit you in for a sick visit. Feel free to contact us  anytime with any questions, problems, or concerns.  It was a pleasure to see you again today!  Websites that have reliable patient information: 1. American Academy of Asthma, Allergy, and Immunology: www.aaaai.org 2. Food Allergy Research and Education (FARE): foodallergy.org 3. Mothers of Asthmatics: http://www.asthmacommunitynetwork.org 4. American College of Allergy, Asthma, and Immunology: www.acaai.org      "Like" us  on Facebook and Instagram for our latest updates!      A healthy democracy works best when [employer] participate! Make sure you are registered to vote! If you have moved or changed any of your contact information, you will need to get this updated before voting! Scan the QR codes below to learn more!

## 2023-10-16 ENCOUNTER — Encounter: Payer: Self-pay | Admitting: Allergy & Immunology

## 2023-10-16 DIAGNOSIS — E782 Mixed hyperlipidemia: Secondary | ICD-10-CM | POA: Diagnosis not present

## 2023-10-16 DIAGNOSIS — J9611 Chronic respiratory failure with hypoxia: Secondary | ICD-10-CM | POA: Diagnosis not present

## 2023-10-16 DIAGNOSIS — J449 Chronic obstructive pulmonary disease, unspecified: Secondary | ICD-10-CM | POA: Diagnosis not present

## 2023-10-16 DIAGNOSIS — I10 Essential (primary) hypertension: Secondary | ICD-10-CM | POA: Diagnosis not present

## 2023-10-16 DIAGNOSIS — G4733 Obstructive sleep apnea (adult) (pediatric): Secondary | ICD-10-CM | POA: Diagnosis not present

## 2023-10-17 ENCOUNTER — Ambulatory Visit: Payer: Self-pay | Admitting: Cardiology

## 2023-10-17 LAB — BASIC METABOLIC PANEL WITH GFR
BUN/Creatinine Ratio: 16 (ref 10–24)
BUN: 15 mg/dL (ref 8–27)
CO2: 23 mmol/L (ref 20–29)
Calcium: 9.4 mg/dL (ref 8.6–10.2)
Chloride: 97 mmol/L (ref 96–106)
Creatinine, Ser: 0.92 mg/dL (ref 0.76–1.27)
Glucose: 106 mg/dL — ABNORMAL HIGH (ref 70–99)
Potassium: 4.3 mmol/L (ref 3.5–5.2)
Sodium: 142 mmol/L (ref 134–144)
eGFR: 94 mL/min/{1.73_m2} (ref >59)

## 2023-10-19 ENCOUNTER — Other Ambulatory Visit: Payer: Self-pay | Admitting: Family Medicine

## 2023-10-26 ENCOUNTER — Encounter (INDEPENDENT_AMBULATORY_CARE_PROVIDER_SITE_OTHER): Payer: Self-pay | Admitting: Otolaryngology

## 2023-10-26 ENCOUNTER — Ambulatory Visit (INDEPENDENT_AMBULATORY_CARE_PROVIDER_SITE_OTHER): Admitting: Otolaryngology

## 2023-10-26 VITALS — BP 167/89 | HR 79 | Ht 72.0 in | Wt 348.0 lb

## 2023-10-26 DIAGNOSIS — G4730 Sleep apnea, unspecified: Secondary | ICD-10-CM | POA: Diagnosis not present

## 2023-10-26 DIAGNOSIS — K219 Gastro-esophageal reflux disease without esophagitis: Secondary | ICD-10-CM

## 2023-10-26 DIAGNOSIS — J342 Deviated nasal septum: Secondary | ICD-10-CM

## 2023-10-26 DIAGNOSIS — R0981 Nasal congestion: Secondary | ICD-10-CM | POA: Diagnosis not present

## 2023-10-26 DIAGNOSIS — R0982 Postnasal drip: Secondary | ICD-10-CM

## 2023-10-26 DIAGNOSIS — J398 Other specified diseases of upper respiratory tract: Secondary | ICD-10-CM

## 2023-10-26 DIAGNOSIS — J3089 Other allergic rhinitis: Secondary | ICD-10-CM

## 2023-10-26 DIAGNOSIS — R09A2 Foreign body sensation, throat: Secondary | ICD-10-CM

## 2023-10-26 DIAGNOSIS — J343 Hypertrophy of nasal turbinates: Secondary | ICD-10-CM

## 2023-10-26 MED ORDER — FAMOTIDINE 20 MG PO TABS: 20.0000 mg | ORAL_TABLET | Freq: Two times a day (BID) | ORAL | 3 refills | Status: DC

## 2023-10-26 NOTE — Patient Instructions (Signed)
 [email or web address][employer] - check out this website to learn more about reflux   -Avoid lying down for at least two hours after a meal or after drinking acidic beverages, like soda, or other caffeinated beverages. This can help to prevent stomach contents from flowing back into the esophagus. -Keep your head elevated while you sleep. Using an extra pillow or two can also help to prevent reflux. -Eat smaller and more frequent meals each day instead of a few large meals. This promotes digestion and can aid in preventing heartburn. -Wear loose-fitting clothes to ease pressure on the stomach, which can worsen heartburn and reflux. -Reduce excess weight around the midsection. This can ease pressure on the stomach. Such pressure can force some stomach contents back up the esophagus - Take Reflux Gourmet (natural supplement available on Amazon) to help with symptoms of chronic throat irritation

## 2023-10-26 NOTE — Progress Notes (Signed)
 ENT CONSULT:  Reason for Consult: chronic throat discomfort and sensation of a "flap" in his throat  HPI: Discussed the use of AI scribe software for clinical note transcription with the patient, who gave verbal consent to proceed.  History of Present Illness Mark Harrell. is a 63 year old male with hx of morbid obesity BMI of 47, OSA, currently CPAP non-compliant, hx of tracheomalacia, hx of asthma/COPD, and environmental allergies, f/b Allergy and on Dupixent , who presents with chronic nasal congestion and throat discomfort as well as sensation of a flap in his throat.   He experiences chronic nasal congestion and throat discomfort, describing a sensation of something 'flipping around' in his throat. This sensation is not consistently reproducible but can occur when leaning over, sitting down, standing up, or swallowing, and feels like something solid scraping in his throat. These symptoms began after two neck fusion surgeries in 2021, one was ACDF, which were followed by respiratory failure and a diagnosis of tracheomalacia.  He has a history of sleep apnea and has attempted CPAP therapy three times but struggles with compliance due to night terrors. He is prescribed Mounjaro  for diabetes and previously used Ozempic , noting some weight changes. Despite using two different nasal sprays, saline, Flonase , allergy pills, and Dupixent , he reports persistent nasal congestion and feels his nasal passages never fully clear.  He has a history of reflux but has not taken reflux medication in about five years. He underwent surgery to remove his uvula, tonsils, and part of the palate. No recent reflux medication use.  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 Recurrent infections - with isolated low IgG and a B cell memory defect - on prophylactic antibiotic  - Controlled on prophylactic antibiotics, no recent infection - Continue with doxycycline  100mg  twice daily for now. - Let's hold off on the immunoglobulin replacement and restart the Dupixent  instead.    2. Asthma-COPD overlap syndrome - I am glad that the Dupixent  is back on board.  - Lung testing looks much better. - We are not going to make any medication changes. - Daily controller medication(s): Trelegy one puff once daily and Singulair  10mg  daily and Dupixent  every two weeks - Prior to physical activity: albuterol  2 puffs 10-15 minutes before physical activity - Rescue medications: albuterol  1-2 puffs every 4-6 hours as needed or DuoNeb nebulizer one vial every 4-6 hours as needed - Asthma control goals:  * Full participation in all desired activities (may need albuterol  before activity) * Albuterol  use two time or less a week on average (not counting use with activity) * Cough interfering with sleep two time or less a month * Oral steroids no more than once a year * No hospitalizations   3. Allergic rhinitis (sweet vernal grass, box elder, cat, weeds, ragweed, molds, cockroach, dust  mite) - Continue with saline mist 1-2 times daily.  - Stop the Azelastine  and start Patanase  two sprays per nostril twice daily. - Continue with Ipratropium one spray per nostril every 8 hours as needed for runny noses.  - Continue with Cetirizine  10mg  daily as needed for runny nose,  sneezing, itchy watery eyes.  - Continue with Singulair /Montelukast  10mg  in the evening.    4. GERD - Continue with heartburn medications as needed.   Visit with Dr Mark Harrell  06/20/23 [name] is a 63 y.o. male who presents is referred by his PCP for evaluation of tracheomalacia noted on recent CT scan.  Patient has had a long history of obstructive sleep apnea.  He recently underwent anterior cervical fusion because of chronic neck problems and a couple days following surgery had a lot of difficulty swallowing and was seen emergently in the ED where he had a CT scan that demonstrated swelling around the previous surgery as well as tracheomalacia.  Patient is swallowing much better presently and has followed up with his surgeon.  He is having no hoarseness and no difficulty breathing presently.  However he does complain of intermittent nasal congestion although he is using Flonase  and azelastine  on a regular basis as well as saline irrigation.  He presents here for upper airway evaluation.    Glottis and subglottis were clear with no obstruction.  The trachea could be visualized down about 4-5 tracheal rings with no collapse noted.  Of note patient does have moderate edema over the arytenoid mucosa consistent with laryngeal pharyngeal reflux disease.   Plan: Concerning his nasal obstruction recommended continue use of the Flonase  and azelastine  spray as well as use of saline irrigation.  He can occasionally use Afrin at night 2 or 3 times per week if he has a lot of trouble breathing through his nose.  Would also recommend elevation of the head of the bed and reviewed this with him. He follows up with pulmonary if he has any trouble breathing would recommend follow-up with pulmonary.  No structural abnormality noted on fiberoptic laryngoscopy and evaluation of the subglottis and upper trachea. He does have findings consistent with laryngeal pharyngeal reflux disease but is on antiacid  therapy.  Past Medical History:  Diagnosis Date   Anesthesia complication requiring reversal agent administration    ? from central apnea, very difficult to get off vent   Anxiety    Arthritis    osteo   Asthma    BPH (benign prostatic hyperplasia)    Complication of anesthesia    difficulty waking , they twlight me because of my respiratory problems "   COPD (chronic obstructive pulmonary disease) (HCC)    COVID    moderate case 2023   Depression    Diabetes mellitus (HCC) 08/31/2023   DVT (deep venous thrombosis) (HCC) 2023   right leg   Dyspnea    on exertion   Enlarged heart    Family history of adverse reaction to anesthesia    mother trouble waking up, and heart stopped   GERD (gastroesophageal reflux disease)    Headache    botox  injections for headaches   Hyperlipidemia    Hypertension    Hypogonadism male    IBS (irritable bowel syndrome)    Kidney stones    Memory difficulties    short term memories   Neuropathy    Obesity    On home oxygen  therapy    on 2 liter   OSA (obstructive  sleep apnea)    not using cpap   Paralysis (HCC)    left hand - small    due to accident with an head injury   Pneumonia    Pre-diabetes    Prostatitis     Past Surgical History:  Procedure Laterality Date   ABDOMINAL SURGERY     ANKLE FRACTURE SURGERY Right    ANTERIOR CERVICAL DECOMP/DISCECTOMY FUSION N/A 05/23/2019   Procedure: ANTERIOR CERVICAL DISCECTOMY FUSION CERVICAL FIVE THROUGH CERVICAL SIX AND CERVICAL SIX THROUGH CERVICAL SEVEN;  Surgeon: Alphonso Jean, MD;  Location: [facility] OR;  Service: Orthopedics;  Laterality: N/A;   BRONCHIAL BRUSHINGS  04/28/2022   Procedure: BRONCHIAL BRUSHINGS;  Surgeon: Denson Flake, MD;  Location: [facility] ENDOSCOPY;  Service: Pulmonary;;   BRONCHIAL NEEDLE ASPIRATION BIOPSY  04/28/2022   Procedure: BRONCHIAL NEEDLE ASPIRATION BIOPSIES;  Surgeon: Denson Flake, MD;  Location: [facility] ENDOSCOPY;  Service: Pulmonary;;   COLONOSCOPY     CYSTOSCOPY      [place]   KNEE ARTHROSCOPY WITH MEDIAL MENISECTOMY Left 01/02/2017   Procedure: LEFT KNEE ARTHROSCOPY WITH PARTIAL MEDIAL MENISCECTOMY;  Surgeon: Arnie Lao, MD;  Location: [facility] ORS;  Service: Orthopedics;  Laterality: Left;   POSTERIOR CERVICAL FUSION/FORAMINOTOMY N/A 08/20/2020   Procedure: LEFT CERVICAL SIX THROUGH SEVEN  AND CERVICAL SEVEN THROUGH THORACIC ONE  FORAMINOTOMIES WITH EXCISION OF DISC HERNIATION LEFT CERVICAL SEVEN THROUGH THORACIC ONE;  Surgeon: Alphonso Jean, MD;  Location: [facility] OR;  Service: Orthopedics;  Laterality: N/A;   TONSILLECTOMY     TURBINATE RESECTION  2007   UVULOPALATOPHARYNGOPLASTY     VIDEO BRONCHOSCOPY WITH RADIAL ENDOBRONCHIAL ULTRASOUND  04/28/2022   Procedure: VIDEO BRONCHOSCOPY WITH RADIAL ENDOBRONCHIAL ULTRASOUND;  Surgeon: Denson Flake, MD;  Location: [facility] ENDOSCOPY;  Service: Pulmonary;;    Family History  Problem Relation Age of Onset   Dementia Mother    COPD Mother    Hypertension Mother    Diabetes Mother    Cancer Father        lymphoma, colon   Migraines Sister    Prostate cancer Maternal Uncle    Migraines Paternal Aunt    Diabetes Paternal Uncle    Diabetes Maternal Grandmother    Heart disease Maternal Grandfather    Diabetes Maternal Grandfather    Diabetes Paternal Grandmother    Diabetes Paternal Grandfather    Heart disease Paternal Grandfather    Lung disease Neg Hx    Rheumatologic disease Neg Hx     Social History:  reports that he quit smoking about 40 years ago. His smoking use included cigarettes. He started smoking about 55 years ago. He has a 1.5 pack-year smoking history. He has been exposed to tobacco smoke. He has never used smokeless tobacco. He reports that he does not currently use alcohol. He reports that he does not use drugs.  Allergies:  Allergies  Allergen Reactions   Bee Venom Swelling   Cymbalta [Duloxetine Hcl] Shortness Of Breath    Brought on asthma   Ppd [Tuberculin Purified  Protein Derivative] Other (See Comments)    +ppd NEG Quantferron Gold 3/13 (shows false positive)    Calan [Verapamil] Other (See Comments)    Back pain   Tricor [Fenofibrate] Other (See Comments)    Back pain   Claritin  [Loratadine ] Other (See Comments)    Unknown reaction    Levaquin  [Levofloxacin ] Diarrhea   Tuberculin, Ppd Other (See Comments)    +ppd NEG Quantferron Gold 3/13    Medications:  I have reviewed the patient's current medications.  The PMH, PSH, Medications, Allergies, and SH were reviewed and updated.  ROS: Constitutional: Negative for fever, weight loss and weight gain. Cardiovascular: Negative for chest pain and dyspnea on exertion. Respiratory: Is not experiencing shortness of breath at rest. Gastrointestinal: Negative for nausea and vomiting. Neurological: Negative for headaches. Psychiatric: The patient is not nervous/anxious  Blood pressure (!) 167/89, pulse 79, height 6' (1.829 m), weight (!) 348 lb (157.9 kg), SpO2 (!) 86%. Body mass index is 47.2 kg/m.  PHYSICAL EXAM:  Exam: General: Well-developed, well-nourished Respiratory Respiratory effort: Equal inspiration and expiration without stridor Cardiovascular Peripheral Vascular: Warm extremities with equal color/perfusion Eyes: No nystagmus with equal extraocular motion bilaterally Neuro/Psych/Balance: Patient oriented to person, place, and time; Appropriate mood and affect; Gait is intact with no imbalance; Cranial nerves I-XII are intact Head and Face Inspection: Normocephalic and atraumatic without mass or lesion Palpation: Facial skeleton intact without bony stepoffs Salivary Glands: No mass or tenderness Facial Strength: Facial motility symmetric and full bilaterally ENT Pinna: External ear intact and fully developed External canal: Canal is patent with intact skin Tympanic Membrane: Clear and mobile External Nose: No scar or anatomic deformity Internal Nose: Septum is S-shaped. No polyp,  or purulence. Mucosal edema and erythema present.  Bilateral inferior turbinate hypertrophy.  Lips, Teeth, and gums: Mucosa and teeth intact and viable TMJ: No pain to palpation with full mobility Oral cavity/oropharynx: No erythema or exudate, no lesions present Nasopharynx: No mass or lesion with intact mucosa Hypopharynx: Intact mucosa without pooling of secretions Larynx Glottic: Full true vocal cord mobility without lesion or mass Supraglottic: Normal appearing epiglottis and AE folds, epiglottis is tall/elongated and close to the pharyngeal wall, no pharyngeal wall bulging Interarytenoid Space: Moderate pachydermia&edema Subglottic Space: Patent without lesion or edema Neck Neck and Trachea: Midline trachea without mass or lesion Thyroid : No mass or nodularity Lymphatics: No lymphadenopathy  Procedure: Preoperative diagnosis: globus sensation throat discomfort  Postoperative diagnosis:   Same + GERD LPR  Procedure: Flexible fiberoptic laryngoscopy  Surgeon: Artice Last, MD  Anesthesia: Topical lidocaine  and Afrin Complications: None Condition is stable throughout exam  Indications and consent:  The patient presents to the clinic with above symptoms. Indirect laryngoscopy view was incomplete. Thus it was recommended that they undergo a flexible fiberoptic laryngoscopy. All of the risks, benefits, and potential complications were reviewed with the patient preoperatively and verbal informed consent was obtained.  Procedure: The patient was seated upright in the clinic. Topical lidocaine  and Afrin were applied to the nasal cavity. After adequate anesthesia had occurred, I then proceeded to pass the flexible telescope into the nasal cavity. The nasal cavity was patent without rhinorrhea or polyp. The nasopharynx was also patent without mass or lesion. The base of tongue was visualized and was normal. There were no signs of pooling of secretions in the piriform sinuses. The true  vocal folds were mobile bilaterally. There were no signs of glottic or supraglottic mucosal lesion or mass. There was moderate interarytenoid pachydermia and post cricoid edema. The telescope was then slowly withdrawn and the patient tolerated the procedure throughout.      Studies Reviewed: CT chest 07/31/23 IMPRESSION: 1. No substantial interval change in the bilateral pulmonary nodules. The largest is in the right lower lobe paraspinal region measuring 3.0 x 1.7 cm. 2. Enlargement of the pulmonary outflow tract/main pulmonary arteries suggests pulmonary arterial hypertension. 3. Hepatic steatosis.  Assessment/Plan: Encounter Diagnoses  Name Primary?   Chronic GERD  Chronic nasal congestion    Post-nasal drip    Environmental and seasonal allergies    Tracheomalacia    Globus pharyngeus [R09.A2] Yes   Nasal septal deviation    Hypertrophy of both inferior nasal turbinates     Assessment and Plan Assessment & Plan Globus sensation. Sensation of something flapping in his throat intermittently, triggered with bending over, and sx onset after ACDF Flexible scope exam today with GERD LPR findings, and post-nasal drainage, epiglottis is tall/elongated and close to the pharyngeal wall, no pharyngeal wall bulging - this could explain sensation he has been having. Hx of GERD, no longer on any medications. We discussed that GLP-1's can exacerbate reflux.  - patient was reassured - Pepcid  20 mg BID -  Reflux Gourmet after meals - diet and lifestyle changes to minimize GERD - Refer to [employer] blog for dietary and lifestyle modifications/reflux cook book  GERD LPR - Pepcid  20 mg BID  -  Reflux Gourmet after meals - diet and lifestyle changes to minimize GERD - Refer to [employer] for dietary and lifestyle modifications/reflux cook book  Tracheomalacia hx of morbid obesity  - Encourage weight loss with GLP-1 agonist therapy.   Sleep apnea Sleep apnea with  difficulty using CPAP due to night terrors. Tirzepatide  (Mounjaro ) may aid in weight loss and improve symptoms. - Continue tirzepatide  (Mounjaro ) for diabetes and potential sleep apnea improvement. - resume CPAP when able   Chronic Nasal congestion Chronic nasal congestion with crusting on scope exam. Managed with nasal sprays, saline, and Dupixent . - continue antihistamine and nasal spray, nasal saline rinses and Dupixent  - see allergy as scheduled     Thank you for allowing me to participate in the care of this patient. Please do not hesitate to contact me with any questions or concerns.   Artice Last, MD Otolaryngology [facility] Phone: [phone] Fax: [phone]    10/26/2023, 2:28 PM

## 2023-10-29 ENCOUNTER — Ambulatory Visit (INDEPENDENT_AMBULATORY_CARE_PROVIDER_SITE_OTHER): Admitting: Podiatry

## 2023-10-29 ENCOUNTER — Encounter: Payer: Self-pay | Admitting: Podiatry

## 2023-10-29 ENCOUNTER — Encounter (INDEPENDENT_AMBULATORY_CARE_PROVIDER_SITE_OTHER): Payer: Self-pay | Admitting: Otolaryngology

## 2023-10-29 DIAGNOSIS — M7752 Other enthesopathy of left foot: Secondary | ICD-10-CM | POA: Diagnosis not present

## 2023-10-29 DIAGNOSIS — M7751 Other enthesopathy of right foot: Secondary | ICD-10-CM | POA: Diagnosis not present

## 2023-10-29 DIAGNOSIS — B351 Tinea unguium: Secondary | ICD-10-CM | POA: Diagnosis not present

## 2023-10-29 DIAGNOSIS — M79676 Pain in unspecified toe(s): Secondary | ICD-10-CM

## 2023-10-29 DIAGNOSIS — M722 Plantar fascial fibromatosis: Secondary | ICD-10-CM | POA: Diagnosis not present

## 2023-11-02 ENCOUNTER — Encounter (HOSPITAL_BASED_OUTPATIENT_CLINIC_OR_DEPARTMENT_OTHER): Payer: Self-pay | Admitting: *Deleted

## 2023-11-02 ENCOUNTER — Ambulatory Visit (HOSPITAL_BASED_OUTPATIENT_CLINIC_OR_DEPARTMENT_OTHER): Admitting: Family Medicine

## 2023-11-02 ENCOUNTER — Other Ambulatory Visit (INDEPENDENT_AMBULATORY_CARE_PROVIDER_SITE_OTHER): Payer: Self-pay | Admitting: Otolaryngology

## 2023-11-02 ENCOUNTER — Encounter (HOSPITAL_BASED_OUTPATIENT_CLINIC_OR_DEPARTMENT_OTHER): Payer: Self-pay | Admitting: Family Medicine

## 2023-11-02 VITALS — BP 163/87 | HR 81 | Ht 72.0 in | Wt 352.3 lb

## 2023-11-02 DIAGNOSIS — E1142 Type 2 diabetes mellitus with diabetic polyneuropathy: Secondary | ICD-10-CM

## 2023-11-02 DIAGNOSIS — I10 Essential (primary) hypertension: Secondary | ICD-10-CM | POA: Diagnosis not present

## 2023-11-02 DIAGNOSIS — Z7984 Long term (current) use of oral hypoglycemic drugs: Secondary | ICD-10-CM | POA: Diagnosis not present

## 2023-11-02 DIAGNOSIS — R21 Rash and other nonspecific skin eruption: Secondary | ICD-10-CM | POA: Diagnosis not present

## 2023-11-02 DIAGNOSIS — Z7985 Long-term (current) use of injectable non-insulin antidiabetic drugs: Secondary | ICD-10-CM

## 2023-11-02 LAB — POCT GLYCOSYLATED HEMOGLOBIN (HGB A1C)
HbA1c POC (<> result, manual entry): 5.3 % (ref 4.0–5.6)
HbA1c, POC (controlled diabetic range): 5.3 % (ref 0.0–7.0)
Hemoglobin A1C: 5.3 % (ref 4.0–5.6)

## 2023-11-02 MED ORDER — PRAVASTATIN SODIUM 40 MG PO TABS: ORAL_TABLET | ORAL | 3 refills | Status: AC

## 2023-11-02 MED ORDER — TIRZEPATIDE 7.5 MG/0.5ML ~~LOC~~ SOAJ: 7.5000 mg | SUBCUTANEOUS | 1 refills | Status: DC

## 2023-11-02 NOTE — Progress Notes (Unsigned)
    Procedures performed today:    None.  Independent interpretation of notes and tests performed by another provider:   None.  Brief History, Exam, Impression, and Recommendations:    BP (!) 163/87 (BP Location: Left Arm, Patient Position: Sitting, Cuff Size: Large)   Pulse 81   Ht 6' (1.829 m)   Wt (!) 352 lb 4.8 oz (159.8 kg)   SpO2 92%   BMI 47.78 kg/m   Type 2 diabetes mellitus with diabetic polyneuropathy, without long-term current use of insulin  (HCC) Assessment & Plan: Ultimately, prior hemoglobin A1c is all been less than 6.5%.  Given history of diabetes, would be reasonable to continue with metformin  and we can look to titrate dose of Mounjaro .  He would like to increase to 7.5 mg dose of Mounjaro , feel that this would be appropriate, sent to pharmacy.  Discussed risk of potential side effects with increasing dose.  Discussed that lower dosages help to improve tolerability, however may not see as much benefit from a blood sugar control or weight loss standpoint and would expect this to be more notable with increasing dosage of Mounjaro . Due for recheck of hemoglobin A1c, completed today Can plan to complete foot exam at future office visit  Orders: -     POCT glycosylated hemoglobin (Hb A1C)  Primary hypertension Assessment & Plan: Blood pressure elevated in office today, however is improved compared to prior office visits.  Did have medication changes with cardiology recently. We can continue with current medication regimen, no changes made today.  Does have follow-up scheduled with cardiology Recommend intermittent monitoring of blood pressure at home, DASH diet   Rash Assessment & Plan: Patient was previously referred to dermatology, however indicates that he did not hear from their office.  On review of referral, it does appear that cardiology had reached out to patient, however his voicemail was full.  We did provide patient with contact information of dermatology  office today so that he may reach out to them to schedule appointment.  Advised to let us  know if he does need further assistance with arranging this   Other orders -     Tirzepatide ; Inject 7.5 mg into the skin once a week.  Dispense: 2 mL; Refill: 1 -     Pravastatin  Sodium; TAKE 1 TABLET BY MOUTH EVERY DAY AT BEDTIME FOR CHOLESTEROL  Dispense: 90 tablet; Refill: 3  Return in about 3 months (around 02/02/2024) for diabetes, hypertension, 40 minutes.   ___________________________________________ Hortence Charter de [place], MD, ABFM, CAQSM Primary Care and Sports Medicine [facility]

## 2023-11-02 NOTE — Patient Instructions (Signed)
  Medication Instructions:  Your physician recommends that you continue on your current medications as directed. Please refer to the Current Medication list given to you today. --If you need a refill on any your medications before your next appointment, please call your pharmacy first. If no refills are authorized on file call the office.--   Referrals/Procedures/Imaging: Dermatology -  [phone]  Follow-Up: Your next appointment:   Your physician recommends that you schedule a follow-up appointment in: 3 month follow up  with Dr. de Peru  You will receive a text message or e-mail with a link to a survey about your care and experience with us  today! We would greatly appreciate your feedback!   Thanks for letting us  be apart of your health journey!!  Primary Care and Sports Medicine   Dr. Court Distance Peru   We encourage you to activate your patient portal called "MyChart".  Sign up information is provided on this After Visit Summary.  MyChart is used to connect with patients for Virtual Visits (Telemedicine).  Patients are able to view lab/test results, encounter notes, upcoming appointments, etc.  Non-urgent messages can be sent to your provider as well. To learn more about what you can do with MyChart, please visit --  [email or web address].

## 2023-11-02 NOTE — Assessment & Plan Note (Signed)
 Ultimately, prior hemoglobin A1c is all been less than 6.5%.  Given history of diabetes, would be reasonable to continue with metformin  and we can look to titrate dose of Mounjaro .  He would like to increase to 7.5 mg dose of Mounjaro , feel that this would be appropriate, sent to pharmacy.  Discussed risk of potential side effects with increasing dose.  Discussed that lower dosages help to improve tolerability, however may not see as much benefit from a blood sugar control or weight loss standpoint and would expect this to be more notable with increasing dosage of Mounjaro . Due for recheck of hemoglobin A1c, completed today Can plan to complete foot exam at future office visit

## 2023-11-02 NOTE — Assessment & Plan Note (Signed)
 Patient was previously referred to dermatology, however indicates that he did not hear from their office.  On review of referral, it does appear that cardiology had reached out to patient, however his voicemail was full.  We did provide patient with contact information of dermatology office today so that he may reach out to them to schedule appointment.  Advised to let us  know if he does need further assistance with arranging this

## 2023-11-02 NOTE — Assessment & Plan Note (Signed)
 Blood pressure elevated in office today, however is improved compared to prior office visits.  Did have medication changes with cardiology recently. We can continue with current medication regimen, no changes made today.  Does have follow-up scheduled with cardiology Recommend intermittent monitoring of blood pressure at home, DASH diet

## 2023-11-02 NOTE — Progress Notes (Signed)
 He presents today complaining of painful toenails.  He is also complaining of painful calluses subfifth bilateral.  Also complaining of painful plantar fasciitis.  States that after the injections he feels much better until just about time to come back for his toenails.  Objective: Vital signs are stable he is alert oriented x 3 pulses are palpable.  He still has palpable bursa beneath the fifth metatarsal heads bilaterally with overlying reactive hyperkeratotic lesions.  These are benign lesions.  He also has moderate to severe pain on palpation medial calcaneal tubercles bilateral.  No central or lateral calcaneal tubercle pain.  Assessment: Pain in limb secondary to onychomycosis bursitis plantar fasciitis bilateral and benign skin lesions.  Plan: Injected bursitis today 2 mg of Kenalog and local anesthetic beneath the fifth metatarsal heads bilaterally.  I also injected the bilateral plantar fascial regions with 10 mg of Kenalog 5 mg of Marcaine.  Debrided nails 1 through 5 and debrided all benign skin lesions.

## 2023-11-04 ENCOUNTER — Other Ambulatory Visit: Payer: Self-pay

## 2023-11-04 ENCOUNTER — Encounter (INDEPENDENT_AMBULATORY_CARE_PROVIDER_SITE_OTHER): Payer: Self-pay

## 2023-11-04 ENCOUNTER — Ambulatory Visit (HOSPITAL_COMMUNITY)
Admission: RE | Admit: 2023-11-04 | Discharge: 2023-11-04 | Disposition: A | Discharge status: Home / Self Care | Source: Ambulatory Visit | Attending: Cardiovascular Disease | Admitting: Cardiovascular Disease

## 2023-11-04 DIAGNOSIS — E876 Hypokalemia: Secondary | ICD-10-CM | POA: Diagnosis not present

## 2023-11-04 DIAGNOSIS — E782 Mixed hyperlipidemia: Secondary | ICD-10-CM | POA: Diagnosis not present

## 2023-11-04 DIAGNOSIS — G4733 Obstructive sleep apnea (adult) (pediatric): Secondary | ICD-10-CM | POA: Diagnosis not present

## 2023-11-04 DIAGNOSIS — J449 Chronic obstructive pulmonary disease, unspecified: Secondary | ICD-10-CM | POA: Diagnosis not present

## 2023-11-04 DIAGNOSIS — I10 Essential (primary) hypertension: Secondary | ICD-10-CM | POA: Insufficient documentation

## 2023-11-04 DIAGNOSIS — I5033 Acute on chronic diastolic (congestive) heart failure: Secondary | ICD-10-CM

## 2023-11-04 LAB — ECHOCARDIOGRAM COMPLETE
Area-P 1/2: 3.77 cm2
S' Lateral: 2.93 cm

## 2023-11-04 MED ORDER — PERFLUTREN LIPID MICROSPHERE
1.0000 mL | INTRAVENOUS | Status: AC | PRN
  Administered 2023-11-04: 3 mL via INTRAVENOUS

## 2023-11-04 NOTE — Progress Notes (Signed)
 Specialty Pharmacy Refill Coordination Note  Mark Harrell. is a 63 y.o. male contacted today regarding refills of specialty medication(s) Dupilumab  (Dupixent )   Patient requested Delivery   Delivery date: 11/06/23   Verified address: [address], [place], [place]. [address]   Medication will be filled on 11/05/23.

## 2023-11-05 ENCOUNTER — Encounter: Payer: Self-pay | Admitting: Emergency Medicine

## 2023-11-05 ENCOUNTER — Other Ambulatory Visit: Payer: Self-pay

## 2023-11-05 ENCOUNTER — Ambulatory Visit: Admitting: Emergency Medicine

## 2023-11-05 ENCOUNTER — Other Ambulatory Visit: Payer: Self-pay | Admitting: Orthopaedic Surgery

## 2023-11-05 VITALS — BP 132/81 | HR 82 | Ht 72.0 in | Wt 350.6 lb

## 2023-11-05 DIAGNOSIS — G4733 Obstructive sleep apnea (adult) (pediatric): Secondary | ICD-10-CM

## 2023-11-05 DIAGNOSIS — J4489 Other specified chronic obstructive pulmonary disease: Secondary | ICD-10-CM

## 2023-11-05 DIAGNOSIS — J441 Chronic obstructive pulmonary disease with (acute) exacerbation: Secondary | ICD-10-CM | POA: Diagnosis not present

## 2023-11-05 DIAGNOSIS — R9389 Abnormal findings on diagnostic imaging of other specified body structures: Secondary | ICD-10-CM

## 2023-11-05 NOTE — Assessment & Plan Note (Signed)
 Plan to repeat his home sleep test.  He is interested in considering retrying CPAP.  Based on that test we will try to order it for him.

## 2023-11-05 NOTE — Assessment & Plan Note (Signed)
 Plan continue Trelegy, Dupixent , albuterol  as needed

## 2023-11-05 NOTE — Patient Instructions (Signed)
 We will plan to repeat your CT scan of the chest in November 2025.  If your pulmonary nodules are stable at that time we may be able to stop doing schedule surveillance. Continue your Trelegy as you have been taking it rinse and gargle after using. Continue your Dupixent  Keep your albuterol  available to use 2 puffs when needed for shortness of breath, chest tightness, wheezing. We will work on getting you a prescription for your oxygen  2-3 L/min for you to use to obtain a new oxygen  concentrator We will arrange for a home sleep test to allow us  to consider restarting CPAP therapy Follow Dr. Baldwin Levee in 8 to 10 weeks

## 2023-11-05 NOTE — Progress Notes (Signed)
 Subjective:    Patient ID: Mark Harrell., male    DOB: 04/28/1961, 63 y.o.   MRN: [id number]  HPI  ROV 11/05/2023 --follow-up visit for 64 year old male with history of minimal tobacco use, COPD/asthma, OSA, DVT.  I have followed him for pulmonary nodules on CT scan of the chest.  He underwent bronchoscopy 04/28/2022 that was cytology negative.  Subsequent imaging actually showed a decrease in size and prominence of his nodules.  Most recent scan had been stable.  We repeated 07/31/2023 as below He is on Trelegy, Dupixent , has albuterol  to use as needed, has Tessalon  Perles He has limited functional capacity. Some daily cough with mucous. Uses mucinex .  He needs a new oxygen  concentrator > 2-3L/min  CT scan of the chest 07/31/2023 reviewed by me shows no mediastinal or hilar adenopathy, right lower lobe paraspinal nodule 3 x 1.7 cm (slightly smaller than 01/2023), stable 1.6 cm nodule in the left costophrenic sulcus, stable tiny clustered nodules in the posterior right costophrenic sulcus, stable left lower lobe micronodularity.  Overall no interval change   Review of Systems As per HPI  Past Medical History:  Diagnosis Date   Anesthesia complication requiring reversal agent administration    ? from central apnea, very difficult to get off vent   Anxiety    Arthritis    osteo   Asthma    BPH (benign prostatic hyperplasia)    Complication of anesthesia    difficulty waking , they twlight me because of my respiratory problems    COPD (chronic obstructive pulmonary disease) (HCC)    COVID    moderate case 2023   Depression    Diabetes mellitus (HCC) 08/31/2023   DVT (deep venous thrombosis) (HCC) 2023   right leg   Dyspnea    on exertion   Enlarged heart    Family history of adverse reaction to anesthesia    mother trouble waking up, and heart stopped   GERD (gastroesophageal reflux disease)    Headache    botox  injections for headaches   Hyperlipidemia    Hypertension     Hypogonadism male    IBS (irritable bowel syndrome)    Kidney stones    Memory difficulties    short term memories   Neuropathy    Obesity    On home oxygen  therapy    on 2 liter   OSA (obstructive sleep apnea)    not using cpap   Paralysis (HCC)    left hand - small    due to accident with an head injury   Pneumonia    Pre-diabetes    Prostatitis            Objective:   Physical Exam  Vitals:   11/05/23 1550  BP: 132/81  Pulse: 82  SpO2: 91%  Weight: (!) 350 lb 9.6 oz (159 kg)  Height: 6' (1.829 m)    Gen: Pleasant, obese, in no distress,  normal affect  ENT: No lesions,  mouth clear,  oropharynx clear, no postnasal drip  Neck: No JVD, no stridor  Lungs: No use of accessory muscles, no crackles or wheezing on normal respiration, no wheeze on forced expiration  Cardiovascular: RRR, heart sounds normal, no murmur or gallops, trace peripheral edema  Musculoskeletal: No deformities, no cyanosis or clubbing  Neuro: alert, awake, non focal  Skin: Warm, no lesions or rash      Assessment & Plan:   Abnormal CT of the chest His right lower lobe  paraspinal nodule has decreased in size.  The right and left basilar nodules are stable.  We have been following since July 2023.  His bronchoscopy was in December 2023.  I will plan to repeat his CT in November.  If stable at that time we may be able to stop doing serial imaging.  OSA (obstructive sleep apnea) Plan to repeat his home sleep test.  He is interested in considering retrying CPAP.  Based on that test we will try to order it for him.  Asthma-COPD overlap syndrome (HCC) Plan continue Trelegy, Dupixent , albuterol  as needed   Time spent 36 minutes  Racheal Buddle, MD, PhD 11/05/2023, 4:17 PM [facility] [phone] or if no answer before 7:00PM call [phone] For any issues after 7:00PM please call eLink [phone]

## 2023-11-05 NOTE — Assessment & Plan Note (Signed)
 His right lower lobe paraspinal nodule has decreased in size.  The right and left basilar nodules are stable.  We have been following since July 2023.  His bronchoscopy was in December 2023.  I will plan to repeat his CT in November.  If stable at that time we may be able to stop doing serial imaging.

## 2023-11-16 DIAGNOSIS — I10 Essential (primary) hypertension: Secondary | ICD-10-CM | POA: Diagnosis not present

## 2023-11-16 DIAGNOSIS — J449 Chronic obstructive pulmonary disease, unspecified: Secondary | ICD-10-CM | POA: Diagnosis not present

## 2023-11-16 DIAGNOSIS — J9611 Chronic respiratory failure with hypoxia: Secondary | ICD-10-CM | POA: Diagnosis not present

## 2023-11-19 ENCOUNTER — Ambulatory Visit: Attending: Cardiovascular Disease | Admitting: Pharmacist

## 2023-11-19 ENCOUNTER — Encounter: Payer: Self-pay | Admitting: Pharmacist

## 2023-11-19 VITALS — BP 152/98 | HR 88

## 2023-11-19 DIAGNOSIS — I10 Essential (primary) hypertension: Secondary | ICD-10-CM

## 2023-11-19 MED ORDER — VALSARTAN 160 MG PO TABS: 160.0000 mg | ORAL_TABLET | Freq: Every day | ORAL | 0 refills | Status: DC

## 2023-11-19 NOTE — Progress Notes (Signed)
 Patient ID: Mark Harrell.                 DOB: 1961-02-03                      MRN: [id number]     HPI: Mark Harrell. is a 63 y.o. male referred by Dr. Court to HTN clinic. PMH is significant for HTN, DVT, CHF, COPD, T2DM, and obesity.  Dr Mark Harrell added hydralazine  25mg  TID at visit in May and patient was referred to HTN clinic.  Patient presents today in good spirits. Ambulates with cane. Has extensive med list. Reports his memory is poor but he is able to remember all medications and dosages.   Blood pressure has been uncontrolled at home. Reports typical readings of 160s/80s. Denies adverse effects to metoprolol , hydralazine , or losartan . Reports well controlled blood sugar. Has frequent SOB which he relates to his COPD. Followed by pulmonary.    Current HTN meds:  Metoprolol  25mg  TID Hydralazine  25mg  TID Losartan  100mg  daily  BP goal: <130/80   Wt Readings from Last 3 Encounters:  11/05/23 (!) 350 lb 9.6 oz (159 kg)  11/02/23 (!) 352 lb 4.8 oz (159.8 kg)  10/26/23 (!) 348 lb (157.9 kg)   BP Readings from Last 3 Encounters:  11/19/23 (!) 152/98  11/05/23 132/81  11/02/23 (!) 163/87   Pulse Readings from Last 3 Encounters:  11/19/23 88  11/05/23 82  11/02/23 81    Renal function: CrCl cannot be calculated (Patient's most recent lab result is older than the maximum 21 days allowed.).  Past Medical History:  Diagnosis Date   Anesthesia complication requiring reversal agent administration    ? from central apnea, very difficult to get off vent   Anxiety    Arthritis    osteo   Asthma    BPH (benign prostatic hyperplasia)    Complication of anesthesia    difficulty waking , they twlight me because of my respiratory problems    COPD (chronic obstructive pulmonary disease) (HCC)    COVID    moderate case 2023   Depression    Diabetes mellitus (HCC) 08/31/2023   DVT (deep venous thrombosis) (HCC) 2023   right leg   Dyspnea    on exertion   Enlarged heart     Family history of adverse reaction to anesthesia    mother trouble waking up, and heart stopped   GERD (gastroesophageal reflux disease)    Headache    botox  injections for headaches   Hyperlipidemia    Hypertension    Hypogonadism male    IBS (irritable bowel syndrome)    Kidney stones    Memory difficulties    short term memories   Neuropathy    Obesity    On home oxygen  therapy    on 2 liter   OSA (obstructive sleep apnea)    not using cpap   Paralysis (HCC)    left hand - small    due to accident with an head injury   Pneumonia    Pre-diabetes    Prostatitis     Current Outpatient Medications on File Prior to Visit  Medication Sig Dispense Refill   albuterol  (VENTOLIN  HFA) 108 (90 Base) MCG/ACT inhaler Inhale 1-2 puffs into the lungs every 6 (six) hours as needed for wheezing or shortness of breath. 18 each 1   anastrozole  (ARIMIDEX ) 1 MG tablet Take 1 mg by mouth daily.  ascorbic acid  (VITAMIN C) 1000 MG tablet Take 1,000 mg by mouth every evening.     benzonatate  (TESSALON ) 100 MG capsule Take 200 mg by mouth.     botulinum toxin Type A  (BOTOX ) 200 units injection Provider to inject 155 units into the muscles of the head and neck every 12 weeks. Discard remainder. 1 each 1   budesonide  (PULMICORT ) 0.5 MG/2ML nebulizer solution INHALE CONTENTS OF 1 VIAL VIA NEBULIZER 2 (TWO) TIMES DAILY AS NEEDED. 360 mL 1   Calcium  Carb-Cholecalciferol  (CALCIUM  + VITAMIN D3 PO) Take 1 tablet by mouth daily.     celecoxib  (CELEBREX ) 200 MG capsule TAKE 1 CAPSULE (200 MG TOTAL) BY MOUTH EVERY 12 (TWELVE) HOURS. 60 capsule 2   cetaphil (CETAPHIL) lotion Apply 1 Application topically 2 (two) times daily.     cetirizine  (ZYRTEC ) 10 MG tablet Take 1 tablet (10 mg total) by mouth daily as needed for allergies. 90 tablet 1   Cholecalciferol  (VITAMIN D3) 125 MCG (5000 UT) TABS Take 5,000-10,000 Units by mouth See admin instructions. Take 10,000 in the morning and 5,000 units in the evening      Cinnamon  500 MG capsule Take 1,000 mg by mouth 3 (three) times daily.     Coenzyme Q10 (CO Q 10 PO) Take 600 mg by mouth daily.     cyclobenzaprine  (FLEXERIL ) 10 MG tablet TAKE 1 TABLET 3 TIMES A DAY ONLY IF NEEDED FOR MUSCLE SPASM (NECK) 90 tablet 2   Dextromethorphan HBr (DELSYM PO) Take by mouth.     diclofenac  Sodium (VOLTAREN ) 1 % GEL Apply 2-4 g topically 2 (two) times daily as needed (pain).     DM-APAP-CPM (CORICIDIN HBP PO) Take by mouth.     donepezil  (ARICEPT ) 23 MG TABS tablet TAKE 1 TABLET DAILY FOR MEMORY 90 tablet 3   dupilumab  (DUPIXENT ) 300 MG/2ML prefilled syringe Inject 600 mg into the skin once for 1 dose. THEN 300MG  EVERY 14 DAYS 4 mL 11   EPINEPHrine  0.3 mg/0.3 mL IJ SOAJ injection Inject 0.3 mg into the muscle as needed for anaphylaxis.     famotidine  (PEPCID ) 20 MG tablet TAKE 1 TABLET BY MOUTH TWICE A DAY 180 tablet 1   furosemide  (LASIX ) 20 MG tablet Take 40mg  (2 tablets) daily for 7 days, then 20mg  daily. 100 tablet 3   Guaifenesin  1200 MG TB12 Take 1,200 mg by mouth 3 (three) times daily.     hydrALAZINE  (APRESOLINE ) 25 MG tablet Take 1 tablet (25 mg total) by mouth 3 (three) times daily. 270 tablet 3   ipratropium (ATROVENT ) 0.03 % nasal spray Place 2 sprays into the nose 3 (three) times daily as needed for rhinitis. 90 mL 1   ipratropium-albuterol  (DUONEB) 0.5-2.5 (3) MG/3ML SOLN Take 3 mLs by nebulization 2 (two) times daily as needed. 120 mL 1   Krill Oil 500 MG CAPS Take 500 mg by mouth 2 (two) times daily.     Magnesium  500 MG CAPS Take 500 mg by mouth 3 (three) times daily.     memantine  (NAMENDA ) 10 MG tablet Take 1 tablet (10 mg total) by mouth 2 (two) times daily. 270 tablet 3   metFORMIN  (GLUCOPHAGE -XR) 500 MG 24 hr tablet TAKES 2 TABLETS TWICE A DAY WITH MEALS FOR DIABETES 360 tablet 3   metoprolol  tartrate (LOPRESSOR ) 25 MG tablet TAKE 1 TABLET (25 MG TOTAL) BY MOUTH IN THE MORNING, AT NOON, AND AT BEDTIME. 270 tablet 1   Misc Natural Products  (GLUCOS-CHONDROIT-MSM COMPLEX) TABS Take 2  tablets by mouth 3 (three) times daily.     montelukast  (SINGULAIR ) 10 MG tablet Take 1 tablet (10 mg total) by mouth at bedtime. 90 tablet 0   Multiple Vitamins-Minerals (MULTIVITAMIN WITH MINERALS) tablet Take 1 tablet by mouth every morning.     Multiple Vitamins-Minerals (PRESERVISION AREDS 2) CAPS Take 1 capsule by mouth 2 (two) times daily. Evening and night     Olopatadine  HCl 0.6 % SOLN Place 2 sprays into the nose 2 (two) times daily as needed. 30.5 g 5   OXYGEN  Inhale 2 L into the lungs.     oxymetazoline  (AFRIN) 0.05 % nasal spray Place 1 spray into both nostrils at bedtime.     pentosan polysulfate (ELMIRON ) 100 MG capsule Take 100 mg by mouth 3 (three) times daily.     potassium chloride  SA (KLOR-CON  M20) 20 MEQ tablet Take 1 tablet (20 mEq total) by mouth daily. 90 tablet 3   pravastatin  (PRAVACHOL ) 40 MG tablet TAKE 1 TABLET BY MOUTH EVERY DAY AT BEDTIME FOR CHOLESTEROL 90 tablet 3   pregabalin  (LYRICA ) 150 MG capsule TAKE 1 CAPSULE 3 TIMES A DAY FOR CHRONIC PAIN 270 capsule 1   promethazine -dextromethorphan (PROMETHAZINE -DM) 6.25-15 MG/5ML syrup TAKE 5 MLS BY MOUTH 4 TIMES A DAY AS NEEDED FOR COUGH 240 mL 1   sertraline  (ZOLOFT ) 100 MG tablet TAKE 2 TABLETS DAILY FOR MOOD / DEPRESSION 180 tablet 3   sodium chloride  (OCEAN) 0.65 % SOLN nasal spray Place 1 spray into both nostrils 4 (four) times daily as needed for congestion. Uses each time before other nasal sprays     tadalafil  (CIALIS ) 5 MG tablet Take 5 mg by mouth daily.      tamsulosin (FLOMAX) 0.4 MG CAPS capsule Take 0.4 mg by mouth at bedtime.     tirzepatide  (MOUNJARO ) 7.5 MG/0.5ML Pen Inject 7.5 mg into the skin once a week. 2 mL 1   traZODone  (DESYREL ) 50 MG tablet TAKE 1 TABLET BY MOUTH EVERYDAY AT BEDTIME 90 tablet 1   TRELEGY ELLIPTA  100-62.5-25 MCG/ACT AEPB Inhale 1 puff into the lungs daily. 60 each 5   trospium (SANCTURA) 20 MG tablet Take 20 mg by mouth 2 (two) times  daily. In the evening and at bedtime     TURMERIC PO Take 2,000 mg by mouth 3 (three) times daily. 1000 mg each     zinc  gluconate 50 MG tablet Take 50 mg by mouth 3 (three) times daily.     acetaminophen  (TYLENOL ) 500 MG tablet Take 1,000 mg by mouth 3 (three) times daily.     triamcinolone  ointment (KENALOG ) 0.1 % Apply 1 Application topically 2 (two) times daily. (Patient not taking: Reported on 11/19/2023) 454 g 0   No current facility-administered medications on file prior to visit.    Allergies  Allergen Reactions   Bee Venom Swelling   Cymbalta [Duloxetine Hcl] Shortness Of Breath    Brought on asthma   Ppd [Tuberculin Purified Protein Derivative] Other (See Comments)    +ppd NEG Quantferron Gold 3/13 (shows false positive)    Calan [Verapamil] Other (See Comments)    Back pain   Tricor [Fenofibrate] Other (See Comments)    Back pain   Claritin  [Loratadine ] Other (See Comments)    Unknown reaction    Levaquin  [Levofloxacin ] Diarrhea   Tuberculin, Ppd Other (See Comments)    +ppd NEG Quantferron Gold 3/13     Assessment/Plan:  1. Hypertension -  HYPERTENSION CONTROL Vitals:   11/19/23 1544 11/19/23 1643  BP: (!) 150/92 (!) 152/98    The patient's blood pressure is elevated above target today.  In order to address the patient's elevated BP: A current anti-hypertensive medication was adjusted today.   Patient BP 150/92 is above goal of <130/80 and sounds similar to his self reported home readings. Discussed options with patient including adding new agents or adjusting current ones. Will switch losartan  to valsartan 160mg  and follow up in 1 month. Anticipate increasing to 160mg  BID +/- addition of amlodipine in future. Recommended patient send in home readings via my chart. F/u in 4-6 weeks  Continue hydralazine  25mg  TID Continue metoprolol  25mg  TID D/c losartan  Start valsartan 160mg  daily Recheck in 4-6 weeks  Chris Shastina Rua, PharmD, BCACP, CDCES, CPP [facility] [address] [place], [place] [address] Phone: [phone]; Fax: [phone] 11/19/2023 5:00 PM

## 2023-11-19 NOTE — Patient Instructions (Addendum)
 It was nice meeting you today  We would like your blood pressure to be less than 130/80  Please stop your losartan  100mg  and start valsartan 160mg  once a day  Continue your hydralazine  and metoprolol   Send in some of your home blood pressure readings via myChart  We will see you back in a month  Medford Bolk, PharmD, BCACP, CDCES, CPP [facility] [address], [place], [place] [address] Phone: [phone]; Fax: [phone] 11/19/2023 3:57 PM

## 2023-11-21 ENCOUNTER — Other Ambulatory Visit (HOSPITAL_BASED_OUTPATIENT_CLINIC_OR_DEPARTMENT_OTHER): Payer: Self-pay | Admitting: Family Medicine

## 2023-11-21 DIAGNOSIS — S161XXD Strain of muscle, fascia and tendon at neck level, subsequent encounter: Secondary | ICD-10-CM

## 2023-11-23 ENCOUNTER — Encounter

## 2023-11-23 DIAGNOSIS — G4730 Sleep apnea, unspecified: Secondary | ICD-10-CM | POA: Diagnosis not present

## 2023-11-23 DIAGNOSIS — G4733 Obstructive sleep apnea (adult) (pediatric): Secondary | ICD-10-CM

## 2023-11-24 ENCOUNTER — Other Ambulatory Visit (HOSPITAL_COMMUNITY): Payer: Self-pay

## 2023-11-24 ENCOUNTER — Other Ambulatory Visit: Payer: Self-pay

## 2023-11-24 ENCOUNTER — Encounter (INDEPENDENT_AMBULATORY_CARE_PROVIDER_SITE_OTHER): Payer: Self-pay

## 2023-11-24 NOTE — Progress Notes (Signed)
 Patient is ok with new delivery date 12/02/23

## 2023-11-24 NOTE — Progress Notes (Signed)
 Specialty Pharmacy Refill Coordination Note  Mark Harrell. is a 63 y.o. male contacted today regarding refills of specialty medication(s) Dupilumab  (Dupixent )   Patient requested (Patient-Rptd) Delivery   Delivery date: 12/02/23   Verified address: (Patient-Rptd) [address] [place], [place]  [address]   Medication will be filled on 07.08.25.   Refill too soon until 7.8.25, left voicemail for patient

## 2023-12-01 ENCOUNTER — Other Ambulatory Visit: Payer: Self-pay

## 2023-12-02 ENCOUNTER — Telehealth: Payer: Self-pay | Admitting: Adult Health

## 2023-12-02 DIAGNOSIS — G43709 Chronic migraine without aura, not intractable, without status migrainosus: Secondary | ICD-10-CM

## 2023-12-02 NOTE — Telephone Encounter (Signed)
 Faxed signed Botox  PA form to [facility] @ [phone].

## 2023-12-07 DIAGNOSIS — G4733 Obstructive sleep apnea (adult) (pediatric): Secondary | ICD-10-CM | POA: Diagnosis not present

## 2023-12-08 DIAGNOSIS — K640 First degree hemorrhoids: Secondary | ICD-10-CM | POA: Diagnosis not present

## 2023-12-08 MED ORDER — ONABOTULINUMTOXINA 200 UNITS IJ SOLR: INTRAMUSCULAR | 3 refills | Status: AC

## 2023-12-08 NOTE — Telephone Encounter (Signed)
 Received approval, please send rx to [facility].  Auth#: [id number] (12/02/23-11/02/24)

## 2023-12-08 NOTE — Telephone Encounter (Signed)
Botox refill sent to [facility].

## 2023-12-08 NOTE — Addendum Note (Signed)
 Addended by: HILLIARD HEATHER CROME on: 12/08/2023 03:24 PM   Modules accepted: Orders

## 2023-12-14 ENCOUNTER — Encounter (HOSPITAL_BASED_OUTPATIENT_CLINIC_OR_DEPARTMENT_OTHER): Payer: Self-pay | Admitting: Family Medicine

## 2023-12-15 ENCOUNTER — Other Ambulatory Visit (HOSPITAL_BASED_OUTPATIENT_CLINIC_OR_DEPARTMENT_OTHER): Payer: Self-pay | Admitting: *Deleted

## 2023-12-15 MED ORDER — TRAZODONE HCL 50 MG PO TABS: 50.0000 mg | ORAL_TABLET | Freq: Every day | ORAL | 1 refills | Status: DC

## 2023-12-15 MED ORDER — ANASTROZOLE 1 MG PO TABS: 1.0000 mg | ORAL_TABLET | Freq: Every day | ORAL | 1 refills | Status: DC

## 2023-12-16 DIAGNOSIS — I10 Essential (primary) hypertension: Secondary | ICD-10-CM | POA: Diagnosis not present

## 2023-12-16 DIAGNOSIS — J449 Chronic obstructive pulmonary disease, unspecified: Secondary | ICD-10-CM | POA: Diagnosis not present

## 2023-12-16 DIAGNOSIS — J9611 Chronic respiratory failure with hypoxia: Secondary | ICD-10-CM | POA: Diagnosis not present

## 2023-12-23 ENCOUNTER — Encounter (HOSPITAL_BASED_OUTPATIENT_CLINIC_OR_DEPARTMENT_OTHER): Payer: Self-pay | Admitting: Family Medicine

## 2023-12-24 MED ORDER — TIRZEPATIDE 10 MG/0.5ML ~~LOC~~ SOAJ: 10.0000 mg | SUBCUTANEOUS | 2 refills | Status: DC

## 2023-12-25 ENCOUNTER — Other Ambulatory Visit: Payer: Self-pay

## 2023-12-25 ENCOUNTER — Encounter (INDEPENDENT_AMBULATORY_CARE_PROVIDER_SITE_OTHER): Payer: Self-pay

## 2023-12-25 NOTE — Progress Notes (Signed)
 Specialty Pharmacy Refill Coordination Note  Mark Tebbetts. is a 63 y.o. male contacted today regarding refills of specialty medication(s) Dupilumab  (Dupixent )   Patient requested (Patient-Rptd) Delivery   Delivery date: 12/30/23   Verified address: (Patient-Rptd) [address], [place],   [address]   Medication will be filled on 12/29/23.

## 2023-12-29 ENCOUNTER — Ambulatory Visit (INDEPENDENT_AMBULATORY_CARE_PROVIDER_SITE_OTHER): Admitting: Adult Health

## 2023-12-29 VITALS — BP 151/86 | HR 72

## 2023-12-29 DIAGNOSIS — G43709 Chronic migraine without aura, not intractable, without status migrainosus: Secondary | ICD-10-CM

## 2023-12-29 MED ORDER — ONABOTULINUMTOXINA 200 UNITS IJ SOLR: 155.0000 [IU] | Freq: Once | INTRAMUSCULAR | Status: DC

## 2023-12-29 MED ORDER — ONABOTULINUMTOXINA 200 UNITS IJ SOLR
155.0000 [IU] | Freq: Once | INTRAMUSCULAR | Status: AC
  Administered 2023-12-29: 155 [IU] via INTRAMUSCULAR

## 2023-12-29 NOTE — Progress Notes (Signed)
 Botox  consent signed Botox - 200 units x 1 vial Lot: [id number] Expiration: 06/2026 NDC: 9976-6078-97  Bacteriostatic 0.9% Sodium Chloride - 4 mL  Lot: [id number] Expiration: 03/25/2025 NDC: 9590-8033-97  Dx: H56.290 S/P  Witnessed by Nena GRADE RN

## 2023-12-29 NOTE — Progress Notes (Signed)
 12/29/23: botox  still working well.  Reports that some of his headaches start in the back of the neck.  He denies an aura.  Denies sensory changes.  Does report photophobia phonophobia and nausea.    09/29/23: Botox  continues to work well for him. Toward the end of the 3 months headaches may increase in the frequency. He states for at least two months he has no headaches.   06/18/23: Botox  continues to work well. Headaches tend to be located in he occipital region and shoulder. Increase in the last couple of weeks since botox  is due. No new medical history or medications.    03/16/23: botox  is working well. Reports on and off neck pain. Reports that orthopedist said that botox  may help.  12/22/22: First botox  with our office. When getting botox  through [facility] it was beneficial for his migraines.   BOTOX  PROCEDURE NOTE FOR MIGRAINE HEADACHE    Contraindications and precautions discussed with patient(above). Aseptic procedure was observed and patient tolerated procedure. Procedure performed by Duwaine Russell, NP  The condition has existed for more than 6 months, and pt does not have a diagnosis of ALS, Myasthenia Gravis or Lambert-Eaton Syndrome.  Risks and benefits of injections discussed and pt agrees to proceed with the procedure.  Written consent obtained. Reviewed potential side effects.     Indication/Diagnosis: chronic migraine BOTOX (G9414) injection was performed according to protocol by [facility]. 200 units of BOTOX  was dissolved into 4 cc NS.   NDC: [address]-98  Type of toxin: Botox   Botox  consent signed Botox - 200 units x 1 vial Lot: [id number] Expiration: 06/2026 NDC: 9976-6078-97   Bacteriostatic 0.9% Sodium Chloride - 4 mL  Lot: [id number] Expiration: 03/25/2025 NDC: 9590-8033-97   Dx: H56.290        Description of procedure:  The patient was placed in a sitting position. The standard protocol was used for Botox  as follows, with 5 units of Botox  injected at each  site:   -Procerus muscle, midline injection  -Corrugator muscle, bilateral injection  -Frontalis muscle, bilateral injection, with 2 sites each side, medial injection was performed in the upper one third of the frontalis muscle, in the region vertical from the medial inferior edge of the superior orbital rim. The lateral injection was again in the upper one third of the forehead vertically above the lateral limbus of the cornea, 1.5 cm lateral to the medial injection site.  -Temporalis muscle injection, 4 sites, bilaterally. The first injection was 3 cm above the tragus of the ear, second injection site was 1.5 cm to 3 cm up from the first injection site in line with the tragus of the ear. The third injection site was 1.5-3 cm forward between the first 2 injection sites. The fourth injection site was 1.5 cm posterior to the second injection site.  -Occipitalis muscle injection, 3 sites, bilaterally. The first injection was done one half way between the occipital protuberance and the tip of the mastoid process behind the ear. The second injection site was done lateral and superior to the first, 1 fingerbreadth from the first injection. The third injection site was 1 fingerbreadth superiorly and medially from the first injection site.  -Cervical paraspinal muscle injection, 2 sites, bilateral knee first injection site was 1 cm from the midline of the cervical spine, 3 cm inferior to the lower border of the occipital protuberance. The second injection site was 1.5 cm superiorly and laterally to the first injection site.  -Trapezius muscle injection was performed at 3 sites, bilaterally. The  first injection site was in the upper trapezius muscle halfway between the inflection point of the neck, and the acromion. The second injection site was one half way between the acromion and the first injection site. The third injection was done between the first injection site and the inflection point of the  neck.   Will return for repeat injection in 3 months.   A 200 units of Botox  was used, 155 units were injected, the rest of the Botox  was wasted. The patient tolerated the procedure well, there were no complications of the above procedure.  Duwaine Russell, MSN, NP-C 12/29/2023, 12:05 PM [facility] [address] [place], [place] [address] [phone]

## 2023-12-30 ENCOUNTER — Telehealth: Payer: Self-pay

## 2023-12-30 NOTE — Telephone Encounter (Signed)
 Called and spoke the patient. Pt was scheduled 8/7 with RB to review CT.   CT is to be scheduled in November. I have cancelled ov and rescheduled for November to follow up after CT is completed.

## 2023-12-31 ENCOUNTER — Ambulatory Visit: Admitting: Emergency Medicine

## 2024-01-05 ENCOUNTER — Encounter: Payer: Self-pay | Admitting: Pharmacist

## 2024-01-05 ENCOUNTER — Ambulatory Visit: Attending: Cardiology | Admitting: Pharmacist

## 2024-01-05 VITALS — BP 161/102 | HR 67

## 2024-01-05 DIAGNOSIS — I10 Essential (primary) hypertension: Secondary | ICD-10-CM | POA: Diagnosis not present

## 2024-01-05 MED ORDER — VALSARTAN 160 MG PO TABS: 160.0000 mg | ORAL_TABLET | Freq: Two times a day (BID) | ORAL | 5 refills | Status: DC

## 2024-01-05 NOTE — Progress Notes (Signed)
 Patient ID: Mark Harrell.                 DOB: 08/30/1960                      MRN: [id number]     HPI: Mark Harrell. is a 63 y.o. male referred by Dr. Court to HTN clinic. PMH is significant for HTN, DVT, CHF, COPD, T2DM, and obesity.  Dr Court added hydralazine  25mg  TID at visit in May and patient was referred to HTN clinic. Remained on metoprolol  tartrate 25mg  TID.  Patient presents today for HTN follow up.  Ambulates with cane. Has extensive med list. Reports his memory is poor but he is able to remember all medications and dosages.   Losartan  changed to valsartan  160mg  at last visit with anticipation of increase.   Home BP readings (log is not dated): 163/94 159/90 146/79 166/103 127/73 133/83   Current HTN meds:  Metoprolol  25mg  TID Hydralazine  25mg  TID Valsartan  160mg  once daily  BP goal: <130/80   Wt Readings from Last 3 Encounters:  11/05/23 (!) 350 lb 9.6 oz (159 kg)  11/02/23 (!) 352 lb 4.8 oz (159.8 kg)  10/26/23 (!) 348 lb (157.9 kg)   BP Readings from Last 3 Encounters:  01/05/24 (!) 161/102  12/29/23 (!) 151/86  11/19/23 (!) 152/98   Pulse Readings from Last 3 Encounters:  01/05/24 67  12/29/23 72  11/19/23 88    Renal function: CrCl cannot be calculated (Patient's most recent lab result is older than the maximum 21 days allowed.).  Past Medical History:  Diagnosis Date   Anesthesia complication requiring reversal agent administration    ? from central apnea, very difficult to get off vent   Anxiety    Arthritis    osteo   Asthma    BPH (benign prostatic hyperplasia)    Complication of anesthesia    difficulty waking , they twlight me because of my respiratory problems    COPD (chronic obstructive pulmonary disease) (HCC)    COVID    moderate case 2023   Depression    Diabetes mellitus (HCC) 08/31/2023   DVT (deep venous thrombosis) (HCC) 2023   right leg   Dyspnea    on exertion   Enlarged heart    Family history of adverse  reaction to anesthesia    mother trouble waking up, and heart stopped   GERD (gastroesophageal reflux disease)    Headache    botox  injections for headaches   Hyperlipidemia    Hypertension    Hypogonadism male    IBS (irritable bowel syndrome)    Kidney stones    Memory difficulties    short term memories   Neuropathy    Obesity    On home oxygen  therapy    on 2 liter   OSA (obstructive sleep apnea)    not using cpap   Paralysis (HCC)    left hand - small    due to accident with an head injury   Pneumonia    Pre-diabetes    Prostatitis     Current Outpatient Medications on File Prior to Visit  Medication Sig Dispense Refill   acetaminophen  (TYLENOL ) 500 MG tablet Take 1,000 mg by mouth 3 (three) times daily.     albuterol  (VENTOLIN  HFA) 108 (90 Base) MCG/ACT inhaler Inhale 1-2 puffs into the lungs every 6 (six) hours as needed for wheezing or shortness of breath. 18 each 1  anastrozole  (ARIMIDEX ) 1 MG tablet Take 1 tablet (1 mg total) by mouth daily. 90 tablet 1   ascorbic acid  (VITAMIN C) 1000 MG tablet Take 1,000 mg by mouth every evening.     benzonatate  (TESSALON ) 100 MG capsule Take 200 mg by mouth.     botulinum toxin Type A  (BOTOX ) 200 units injection Provider to inject 155 units into the muscles of the head and neck every 12 weeks. Discard remainder. 1 each 3   budesonide  (PULMICORT ) 0.5 MG/2ML nebulizer solution INHALE CONTENTS OF 1 VIAL VIA NEBULIZER 2 (TWO) TIMES DAILY AS NEEDED. 360 mL 1   Calcium  Carb-Cholecalciferol  (CALCIUM  + VITAMIN D3 PO) Take 1 tablet by mouth daily.     celecoxib  (CELEBREX ) 200 MG capsule TAKE 1 CAPSULE (200 MG TOTAL) BY MOUTH EVERY 12 (TWELVE) HOURS. 60 capsule 2   cetaphil (CETAPHIL) lotion Apply 1 Application topically 2 (two) times daily.     cetirizine  (ZYRTEC ) 10 MG tablet Take 1 tablet (10 mg total) by mouth daily as needed for allergies. 90 tablet 1   Cholecalciferol  (VITAMIN D3) 125 MCG (5000 UT) TABS Take 5,000-10,000 Units by  mouth See admin instructions. Take 10,000 in the morning and 5,000 units in the evening     Cinnamon  500 MG capsule Take 1,000 mg by mouth 3 (three) times daily.     Coenzyme Q10 (CO Q 10 PO) Take 600 mg by mouth daily.     cyclobenzaprine  (FLEXERIL ) 10 MG tablet TAKE 1 TABLET 3 TIMES A DAY ONLY IF NEEDED FOR MUSCLE SPASM (NECK) 90 tablet 2   Dextromethorphan HBr (DELSYM PO) Take by mouth.     diclofenac  Sodium (VOLTAREN ) 1 % GEL Apply 2-4 g topically 2 (two) times daily as needed (pain).     DM-APAP-CPM (CORICIDIN HBP PO) Take by mouth.     donepezil  (ARICEPT ) 23 MG TABS tablet TAKE 1 TABLET DAILY FOR MEMORY 90 tablet 3   dupilumab  (DUPIXENT ) 300 MG/2ML prefilled syringe Inject 600 mg into the skin once for 1 dose. THEN 300MG  EVERY 14 DAYS 4 mL 11   EPINEPHrine  0.3 mg/0.3 mL IJ SOAJ injection Inject 0.3 mg into the muscle as needed for anaphylaxis.     famotidine  (PEPCID ) 20 MG tablet TAKE 1 TABLET BY MOUTH TWICE A DAY 180 tablet 1   furosemide  (LASIX ) 20 MG tablet Take 40mg  (2 tablets) daily for 7 days, then 20mg  daily. 100 tablet 3   Guaifenesin  1200 MG TB12 Take 1,200 mg by mouth 3 (three) times daily.     hydrALAZINE  (APRESOLINE ) 25 MG tablet Take 1 tablet (25 mg total) by mouth 3 (three) times daily. 270 tablet 3   ipratropium (ATROVENT ) 0.03 % nasal spray Place 2 sprays into the nose 3 (three) times daily as needed for rhinitis. 90 mL 1   ipratropium-albuterol  (DUONEB) 0.5-2.5 (3) MG/3ML SOLN Take 3 mLs by nebulization 2 (two) times daily as needed. 120 mL 1   Krill Oil 500 MG CAPS Take 500 mg by mouth 2 (two) times daily.     Magnesium  500 MG CAPS Take 500 mg by mouth 3 (three) times daily.     memantine  (NAMENDA ) 10 MG tablet Take 1 tablet (10 mg total) by mouth 2 (two) times daily. 270 tablet 3   metFORMIN  (GLUCOPHAGE -XR) 500 MG 24 hr tablet TAKES 2 TABLETS TWICE A DAY WITH MEALS FOR DIABETES 360 tablet 3   metoprolol  tartrate (LOPRESSOR ) 25 MG tablet TAKE 1 TABLET (25 MG TOTAL) BY  MOUTH IN THE  MORNING, AT NOON, AND AT BEDTIME. 270 tablet 1   Misc Natural Products (GLUCOS-CHONDROIT-MSM COMPLEX) TABS Take 2 tablets by mouth 3 (three) times daily.     montelukast  (SINGULAIR ) 10 MG tablet Take 1 tablet (10 mg total) by mouth at bedtime. 90 tablet 0   Multiple Vitamins-Minerals (MULTIVITAMIN WITH MINERALS) tablet Take 1 tablet by mouth every morning.     Multiple Vitamins-Minerals (PRESERVISION AREDS 2) CAPS Take 1 capsule by mouth 2 (two) times daily. Evening and night     Olopatadine  HCl 0.6 % SOLN Place 2 sprays into the nose 2 (two) times daily as needed. 30.5 g 5   OXYGEN  Inhale 2 L into the lungs.     oxymetazoline  (AFRIN) 0.05 % nasal spray Place 1 spray into both nostrils at bedtime.     pentosan polysulfate (ELMIRON ) 100 MG capsule Take 100 mg by mouth 3 (three) times daily.     potassium chloride  SA (KLOR-CON  M20) 20 MEQ tablet Take 1 tablet (20 mEq total) by mouth daily. 90 tablet 3   pravastatin  (PRAVACHOL ) 40 MG tablet TAKE 1 TABLET BY MOUTH EVERY DAY AT BEDTIME FOR CHOLESTEROL 90 tablet 3   pregabalin  (LYRICA ) 150 MG capsule TAKE 1 CAPSULE 3 TIMES A DAY FOR CHRONIC PAIN 270 capsule 1   promethazine -dextromethorphan (PROMETHAZINE -DM) 6.25-15 MG/5ML syrup TAKE 5 MLS BY MOUTH 4 TIMES A DAY AS NEEDED FOR COUGH 240 mL 1   sertraline  (ZOLOFT ) 100 MG tablet TAKE 2 TABLETS DAILY FOR MOOD / DEPRESSION 180 tablet 3   sodium chloride  (OCEAN) 0.65 % SOLN nasal spray Place 1 spray into both nostrils 4 (four) times daily as needed for congestion. Uses each time before other nasal sprays     tadalafil  (CIALIS ) 5 MG tablet Take 5 mg by mouth daily.      tamsulosin (FLOMAX) 0.4 MG CAPS capsule Take 0.4 mg by mouth at bedtime.     tirzepatide  (MOUNJARO ) 10 MG/0.5ML Pen Inject 10 mg into the skin once a week. 2 mL 2   traZODone  (DESYREL ) 50 MG tablet Take 1 tablet (50 mg total) by mouth at bedtime. 90 tablet 1   TRELEGY ELLIPTA  100-62.5-25 MCG/ACT AEPB Inhale 1 puff into the lungs  daily. 60 each 5   triamcinolone  ointment (KENALOG ) 0.1 % Apply 1 Application topically 2 (two) times daily. 454 g 0   trospium (SANCTURA) 20 MG tablet Take 20 mg by mouth 2 (two) times daily. In the evening and at bedtime     TURMERIC PO Take 2,000 mg by mouth 3 (three) times daily. 1000 mg each     zinc  gluconate 50 MG tablet Take 50 mg by mouth 3 (three) times daily.     No current facility-administered medications on file prior to visit.    Allergies  Allergen Reactions   Bee Venom Swelling   Cymbalta [Duloxetine Hcl] Shortness Of Breath    Brought on asthma   Ppd [Tuberculin Purified Protein Derivative] Other (See Comments)    +ppd NEG Quantferron Gold 3/13 (shows false positive)    Calan [Verapamil] Other (See Comments)    Back pain   Tricor [Fenofibrate] Other (See Comments)    Back pain   Claritin  [Loratadine ] Other (See Comments)    Unknown reaction    Levaquin  [Levofloxacin ] Diarrhea   Tuberculin, Ppd Other (See Comments)    +ppd NEG Quantferron Gold 3/13     Assessment/Plan:  1. Hypertension -  HYPERTENSION CONTROL Vitals:   01/05/24 1352 01/05/24 1353  BP: ROLLEN)  167/103 (!) 161/102    The patient's blood pressure is elevated above target today.  In order to address the patient's elevated BP: A current anti-hypertensive medication was adjusted today.; A referral to the [facility] will be placed.     Patient BP of 161/102 remains above goal of <130/80. Not much change from readings while on losartan . Will increase valsartan  to 160mg  BID and have patient update labs in 2 weeks. Follow up next month for possible addition of CCB or thiazide.   Continue hydralazine  25mg  TID Continue metoprolol  25mg  TID Increase valsartan  160mg  twice daily Check CMP in 2 weeks Recheck in 4-6 weeks  Medford Bolk, PharmD, BCACP, CDCES, CPP [facility] [address] [place], [place] [address] Phone: [phone]; Fax: [phone] 01/05/2024 2:21 PM

## 2024-01-05 NOTE — Patient Instructions (Signed)
 Good seeing you again  We are trying to get your blood pressure down to less than 130/80  We will increase your valsartan  to 160mg  twice a day  Please continue your hydralazine  and metoprolol  three times daily  Please continue to monitor your blood pressure at home and message or call us  with any issues   Medford Bolk, PharmD, BCACP, CDCES, CPP [facility] [address], [place], [place] [address] Phone: [phone]; Fax: [phone] 01/05/2024 2:01 PM

## 2024-01-06 NOTE — Telephone Encounter (Signed)
 I called [facility] to see why his botox  wasn't delivered yesterday and they said that on 8/7 the patient called and he was told it was going to get delivered on Friday 8/8 but the patient said the office would be closed. He was told that it would be delivered on 8/12 instead but whoever he talked to also cancelled the whole prescription by accident. They were able to put the prescription back through and called the patient and got his consent to ship. It is now supposed to be delivered on 8/19.

## 2024-01-06 NOTE — Telephone Encounter (Signed)
 Received fax confirmation of shipment for 8/19.

## 2024-01-13 ENCOUNTER — Other Ambulatory Visit: Payer: Self-pay | Admitting: Allergy & Immunology

## 2024-01-13 DIAGNOSIS — G43709 Chronic migraine without aura, not intractable, without status migrainosus: Secondary | ICD-10-CM | POA: Diagnosis not present

## 2024-01-14 NOTE — Telephone Encounter (Signed)
 Received replacement Botox  from [facility].

## 2024-01-16 DIAGNOSIS — I10 Essential (primary) hypertension: Secondary | ICD-10-CM | POA: Diagnosis not present

## 2024-01-16 DIAGNOSIS — J9611 Chronic respiratory failure with hypoxia: Secondary | ICD-10-CM | POA: Diagnosis not present

## 2024-01-18 ENCOUNTER — Other Ambulatory Visit: Payer: Self-pay

## 2024-01-18 ENCOUNTER — Ambulatory Visit (INDEPENDENT_AMBULATORY_CARE_PROVIDER_SITE_OTHER): Admitting: Orthopaedic Surgery

## 2024-01-18 VITALS — Ht 72.0 in | Wt 341.0 lb

## 2024-01-18 DIAGNOSIS — M25562 Pain in left knee: Secondary | ICD-10-CM | POA: Diagnosis not present

## 2024-01-18 DIAGNOSIS — R350 Frequency of micturition: Secondary | ICD-10-CM | POA: Diagnosis not present

## 2024-01-18 DIAGNOSIS — M170 Bilateral primary osteoarthritis of knee: Secondary | ICD-10-CM

## 2024-01-18 DIAGNOSIS — N401 Enlarged prostate with lower urinary tract symptoms: Secondary | ICD-10-CM | POA: Diagnosis not present

## 2024-01-18 DIAGNOSIS — R3915 Urgency of urination: Secondary | ICD-10-CM | POA: Diagnosis not present

## 2024-01-18 DIAGNOSIS — G8929 Other chronic pain: Secondary | ICD-10-CM | POA: Diagnosis not present

## 2024-01-18 DIAGNOSIS — I10 Essential (primary) hypertension: Secondary | ICD-10-CM | POA: Diagnosis not present

## 2024-01-18 DIAGNOSIS — M1712 Unilateral primary osteoarthritis, left knee: Secondary | ICD-10-CM

## 2024-01-18 MED ORDER — LIDOCAINE HCL 1 % IJ SOLN
3.0000 mL | INTRAMUSCULAR | Status: AC | PRN
  Administered 2024-01-18: 3 mL

## 2024-01-18 MED ORDER — METHYLPREDNISOLONE ACETATE 40 MG/ML IJ SUSP
40.0000 mg | INTRAMUSCULAR | Status: AC | PRN
  Administered 2024-01-18: 40 mg via INTRA_ARTICULAR

## 2024-01-18 NOTE — Progress Notes (Signed)
 The patient's 63 year old gentleman we have seen in the past.  He has known arthritis in both of his knees and chronic pain in both of his knees.  He last had hyaluronic acid injections of Orthovisc in both knees back in May.  He has been having a lot of left and right knee pain recently but the left much worse than the right.  He does ambulate using a cane.  His BMI today is 46.25.  Examination of both knees today shows just a slight effusion with varus malalignment and pain in both knees throughout the arc of motion of the knees especially more on the left than the right.  He has tried to work on weight loss and activity modification to rest the left knee.  It was definitely worth trying a steroid injection in both knees today.  He is still not a surgical candidate given his morbid obesity.  He understands this as well.  We can then see him back in 3 months to consider hyaluronic acid injections again and evaluating him at that point for his knees once again.    Procedure Note  Patient: Mark Harrell.             Date of Birth: 1960-07-29           MRN: [id number]             Visit Date: 01/18/2024  Procedures: Visit Diagnoses:  1. Unilateral primary osteoarthritis, left knee   2. Chronic pain of left knee     Large Joint Inj: R knee on 01/18/2024 1:43 PM Indications: diagnostic evaluation and pain Details: 22 G 1.5 in needle, superolateral approach  Arthrogram: No  Medications: 3 mL lidocaine  1 %; 40 mg methylPREDNISolone  acetate 40 MG/ML Outcome: tolerated well, no immediate complications Procedure, treatment alternatives, risks and benefits explained, specific risks discussed. Consent was given by the patient. Immediately prior to procedure a time out was called to verify the correct patient, procedure, equipment, support staff and site/side marked as required. Patient was prepped and draped in the usual sterile fashion.    Large Joint Inj: L knee on 01/18/2024 1:44  PM Indications: diagnostic evaluation and pain Details: 22 G 1.5 in needle, superolateral approach  Arthrogram: No  Medications: 3 mL lidocaine  1 %; 40 mg methylPREDNISolone  acetate 40 MG/ML Outcome: tolerated well, no immediate complications Procedure, treatment alternatives, risks and benefits explained, specific risks discussed. Consent was given by the patient. Immediately prior to procedure a time out was called to verify the correct patient, procedure, equipment, support staff and site/side marked as required. Patient was prepped and draped in the usual sterile fashion.

## 2024-01-19 ENCOUNTER — Ambulatory Visit: Payer: Self-pay | Admitting: Cardiovascular Disease

## 2024-01-19 LAB — COMPREHENSIVE METABOLIC PANEL WITH GFR
ALT: 19 IU/L (ref 0–44)
AST: 22 IU/L (ref 0–40)
Albumin: 4.3 g/dL (ref 3.9–4.9)
Alkaline Phosphatase: 86 IU/L (ref 44–121)
BUN/Creatinine Ratio: 16 (ref 10–24)
BUN: 17 mg/dL (ref 8–27)
Bilirubin Total: 0.8 mg/dL (ref 0.0–1.2)
CO2: 25 mmol/L (ref 20–29)
Calcium: 9.6 mg/dL (ref 8.6–10.2)
Chloride: 97 mmol/L (ref 96–106)
Creatinine, Ser: 1.06 mg/dL (ref 0.76–1.27)
Globulin, Total: 2 g/dL (ref 1.5–4.5)
Glucose: 80 mg/dL (ref 70–99)
Potassium: 4.2 mmol/L (ref 3.5–5.2)
Sodium: 139 mmol/L (ref 134–144)
Total Protein: 6.3 g/dL (ref 6.0–8.5)
eGFR: 79 mL/min/1.73 (ref >59)

## 2024-01-20 ENCOUNTER — Encounter (INDEPENDENT_AMBULATORY_CARE_PROVIDER_SITE_OTHER): Payer: Self-pay

## 2024-01-20 ENCOUNTER — Other Ambulatory Visit: Payer: Self-pay

## 2024-01-20 NOTE — Progress Notes (Signed)
 Specialty Pharmacy Refill Coordination Note  Mark Harrell. is a 63 y.o. male contacted today regarding refills of specialty medication(s) Dupilumab  (Dupixent )   Patient requested (Patient-Rptd) Delivery   Delivery date: 01/27/24   Verified address: (Patient-Rptd) [address], [place], [place].  [address]   Medication will be filled on 01/26/24.

## 2024-01-21 ENCOUNTER — Other Ambulatory Visit: Payer: Self-pay

## 2024-01-21 ENCOUNTER — Ambulatory Visit (INDEPENDENT_AMBULATORY_CARE_PROVIDER_SITE_OTHER): Admitting: Allergy & Immunology

## 2024-01-21 VITALS — BP 152/100 | HR 85 | Temp 98.2°F

## 2024-01-21 DIAGNOSIS — J3089 Other allergic rhinitis: Secondary | ICD-10-CM | POA: Diagnosis not present

## 2024-01-21 DIAGNOSIS — K219 Gastro-esophageal reflux disease without esophagitis: Secondary | ICD-10-CM | POA: Diagnosis not present

## 2024-01-21 DIAGNOSIS — B999 Unspecified infectious disease: Secondary | ICD-10-CM | POA: Diagnosis not present

## 2024-01-21 DIAGNOSIS — J302 Other seasonal allergic rhinitis: Secondary | ICD-10-CM

## 2024-01-21 DIAGNOSIS — R918 Other nonspecific abnormal finding of lung field: Secondary | ICD-10-CM

## 2024-01-21 DIAGNOSIS — L2084 Intrinsic (allergic) eczema: Secondary | ICD-10-CM

## 2024-01-21 DIAGNOSIS — J4489 Other specified chronic obstructive pulmonary disease: Secondary | ICD-10-CM

## 2024-01-21 MED ORDER — CETIRIZINE HCL 10 MG PO TABS: 10.0000 mg | ORAL_TABLET | Freq: Every day | ORAL | 1 refills | Status: DC

## 2024-01-21 MED ORDER — EPINEPHRINE 0.3 MG/0.3ML IJ SOAJ: 0.3000 mg | INTRAMUSCULAR | 2 refills | Status: DC | PRN

## 2024-01-21 MED ORDER — PANTOPRAZOLE SODIUM 40 MG PO TBEC: 40.0000 mg | DELAYED_RELEASE_TABLET | Freq: Every day | ORAL | 1 refills | Status: DC

## 2024-01-21 MED ORDER — BREZTRI AEROSPHERE 160-9-4.8 MCG/ACT IN AERO: 2.0000 | INHALATION_SPRAY | Freq: Two times a day (BID) | RESPIRATORY_TRACT | 5 refills | Status: AC

## 2024-01-21 MED ORDER — ALBUTEROL SULFATE HFA 108 (90 BASE) MCG/ACT IN AERS: 1.0000 | INHALATION_SPRAY | Freq: Four times a day (QID) | RESPIRATORY_TRACT | 1 refills | Status: DC | PRN

## 2024-01-21 MED ORDER — TRIAMCINOLONE ACETONIDE 0.1 % EX OINT: 1.0000 | TOPICAL_OINTMENT | Freq: Two times a day (BID) | CUTANEOUS | 0 refills | Status: AC

## 2024-01-21 NOTE — Progress Notes (Signed)
 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 - seems to be stable at this point on doxycycline  BID   Asthma-COPD overlap syndrome with worsening pulmonary status since contracting COVID pneumonia in January 2023   Previously doing well on Nucala  and then Dupixent  (stopped January 2023 after his COVID PNA, but now back on Dupixent ) - latest AEC 227 in December 2024   Bilateral pulmonary nodules - benign biopsies in December 2023   Hymenoptera allergy - EpiPen  up to date   GERD - on PRN H2 blocker    Obesity - with resulting nerve impingement and joint pain   Chronic back pain - s/p recent neck surgery   Migraines - on botulinum injections   Polypharmacy   Normal echocardiogram (May 2023)   Recent passing of his mother  Plan/Recommendations:   1. Recurrent infections - with isolated low IgG and a B cell memory defect - on prophylactic antibiotic  - Thankfully you have not had any recent infections. - Ok to stay off of doxycycline .   2. Asthma-COPD overlap syndrome - Lung testing looks stable.  - We are going to change from Trelegy to Breztri  two puffs twice daily. - We could consider doing another biologic for asthma, but let's try this too.  - Spacer teaching reviewed.  - Daily controller medication(s): Breztri  two puffs twice daily with spacer and Singulair  10mg  daily and Dupixent  every two weeks - Prior to physical activity: albuterol  2 puffs 10-15 minutes before physical activity - Rescue medications: albuterol  1-2 puffs every 4-6 hours as needed or DuoNeb nebulizer one vial every 4-6 hours as needed - Asthma control goals:  * Full participation in all desired activities (may need albuterol  before activity) * Albuterol  use two time or less a week on average (not  counting use with activity) * Cough interfering with sleep two time or less a month * Oral steroids no more than once a year * No hospitalizations   3. Allergic rhinitis (sweet vernal grass, box elder, cat, weeds, ragweed, molds, cockroach, dust mite) - Continue with saline mist 1-2 times daily.  - Continue Patanase  two sprays per nostril twice daily. - Continue with Ipratropium one spray per nostril every 8 hours as needed for runny noses.  - Continue with Cetirizine  10mg  daily as needed for runny nose, sneezing, itchy watery eyes.  - Continue with Singulair  (montelukast ) 10mg  in the evening.   4. GERD - Stop the famotidine  20mg  twice daily.  - Add on Protonix  40 mg daily to see if this helps with any improvement in symptoms at that time.   5. Return in about 3 months (around 04/22/2024). You can have the follow up appointment with Dr. Iva or a Nurse Practicioner (our Nurse Practitioners are excellent and always have Physician oversight!).   Subjective:   Mark Lybrand. is a 63 y.o. male presenting today for follow up of  Chief Complaint  Patient presents with   Follow-up    C/o shortness of breath x 3 months   Nasal Congestion   Wheezing   Cough    Mark Harrell. has a history of the following: Patient Active Problem List   Diagnosis Date Noted   Acute on chronic diastolic heart failure (HCC) 09/30/2023   Diabetes mellitus (HCC) 08/31/2023  Rash 08/31/2023   Cognitive changes 08/31/2023   Unilateral primary osteoarthritis, left knee 02/24/2022   Unilateral primary osteoarthritis, right knee 02/24/2022   Abnormal CT of the chest 01/28/2022   History of adenomatous polyp of colon 01/08/2022   Hypokalemia 09/26/2021   Dyspnea 09/25/2021   Acute deep vein thrombosis (DVT) of right lower extremity (HCC) 09/24/2021   OSA (obstructive sleep apnea) 09/24/2021   Herniation of cervical intervertebral disc with radiculopathy    Status post cervical discectomy  08/20/2020   Tracheomalacia 05/26/2019   Acquired tracheomalacia 05/26/2019   HNP (herniated nucleus pulposus) with myelopathy, cervical 05/23/2019    Class: Chronic   Spinal stenosis of cervical region 05/23/2019    Class: Chronic   Status post cervical spinal fusion 05/23/2019   Chronic respiratory failure with hypoxia (HCC) 04/12/2018   Bilateral lower extremity edema 12/29/2017   Hymenoptera allergy 11/10/2017   Family history of colonic polyps 08/07/2017   Diverticulosis 08/07/2017   Flatus 08/07/2017   Myofascial pain 08/06/2017   Seasonal and perennial allergic rhinitis 04/23/2017   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/2017   Cervicalgia 04/01/2017   Recurrent pneumonia 03/09/2017   Other spondylosis with radiculopathy, cervical region 02/09/2017   Depression with anxiety 01/04/2017   Memory difficulty 12/05/2016   Diarrhea in adult patient 12/03/2016   Internal hemorrhoids 12/03/2016   Chronic migraine without aura without status migrainosus, not intractable 10/30/2016   Lumbar radiculopathy 05/15/2016   Osteoarthritis of spine with radiculopathy, lumbar region 05/15/2016   Risk for falls 05/15/2016   Recurrent infections 03/29/2016   Chronic nonseasonal allergic rhinitis due to pollen 03/29/2016   Polypharmacy 01/16/2016   Morbid obesity with BMI of 45.0-49.9, adult (HCC) 03/06/2015   SDAT 02/05/2015   OSA and COPD overlap syndrome (HCC) 02/05/2015   Medication management 08/02/2014   GERD (gastroesophageal reflux disease) 05/09/2014   Vitamin D  deficiency 08/01/2013   Positive TB test 07/29/2011   Diverticula of colon 05/07/2011   Hypertension 01/31/2011   Hyperlipidemia, mixed 01/31/2011   BPH (benign prostatic hyperplasia) 01/31/2011   Testosterone  Deficiency 01/31/2011   IBS (irritable bowel syndrome) 01/31/2011   Partial complex seizure disorder with intractable epilepsy (HCC) 01/31/2011   Depression, major, recurrent, in partial remission (HCC) 01/31/2011    Asthma-COPD overlap syndrome (HCC) 01/31/2011    History obtained from: chart review and patient.  Discussed the use of AI scribe software for clinical note transcription with the patient and/or guardian, who gave verbal consent to proceed.  Mark Harrell is a 63 y.o. male presenting for a follow up visit.  He was last seen in May 2025.  At that time, he was doing well on doxycycline  100 mg twice a day.  We held off on immunoglobulin.  For his asthma, we got the Dupixent  back on board and he was feeling better.  We continue with Trelegy 1 puff once daily and Singulair  10 mg daily as well as Dupixent  every 2 weeks.  For his rhinitis, we stopped the Astelin  and started Patanase  2 sprays per nostril twice daily and continue with ipratropium.  He also remains on cetirizine  as well as montelukast .  Since last visit, he has done very well.   Asthma/Respiratory Symptom History: He experiences shortness of breath, which is sometimes relieved by albuterol . He is currently on Dupixent , which he feels helps his skin but is unsure about its impact on his breathing. He uses albuterol  occasionally, which provides some relief. He is also on Trelegy, which he finds somewhat effective, and has previously tried  Symbicort  and Breztri . His shortness of breath is exacerbated by physical activity and certain body positions.  He has a history of sleep apnea and is considering retrying CPAP therapy, despite experiencing night terrors with its use in the past. He is scheduled for a sleep test and plans to see a neurologist.  GERD Symptom History: He takes famotidine  20 mg twice a day for potential reflux, though he is unsure of its effectiveness. He has been on it for a couple of months and has not tried omeprazole or pantoprazole .   Infection Symptom History: He was previously on doxycycline  but has stopped it, though he cannot recall when or why. He has not bene having a lot of breakthrough infections at all. Overall he is doing  very well. He seems happy with how well he is doing from an infection standpoint.   He is currently involved in remodeling his mother's house and is staying there. There was a lot of deferred maintenance and he has had a problem working with his sister to manage his late mother's finances.   Otherwise, there have been no changes to his past medical history, surgical history, family history, or social history.    Review of systems otherwise negative other than that mentioned in the HPI.    Objective:   Blood pressure (!) 152/100, pulse 85, temperature 98.2 F (36.8 C), SpO2 96%. There is no height or weight on file to calculate BMI.    Physical Exam Vitals reviewed.  Constitutional:      Appearance: He is well-developed. He is morbidly obese. He is not ill-appearing or toxic-appearing.     Comments: He seems more energetic today.  HENT:     Head: Normocephalic and atraumatic.     Right Ear: Tympanic membrane, ear canal and external ear normal.     Left Ear: Tympanic membrane, ear canal and external ear normal.     Nose: No nasal deformity, septal deviation, mucosal edema or rhinorrhea.     Right Turbinates: Enlarged, swollen and pale.     Left Turbinates: Enlarged, swollen and pale.     Right Sinus: No maxillary sinus tenderness or frontal sinus tenderness.     Left Sinus: No maxillary sinus tenderness or frontal sinus tenderness.     Comments: No nasal polyps.     Mouth/Throat:     Lips: Pink.     Mouth: Mucous membranes are moist. Mucous membranes are not pale and not dry.     Pharynx: Uvula midline.  Eyes:     General: Lids are normal. Allergic shiner present.        Right eye: No discharge.        Left eye: No discharge.     Conjunctiva/sclera: Conjunctivae normal.     Right eye: Right conjunctiva is not injected. No chemosis.    Left eye: Left conjunctiva is not injected. No chemosis.    Pupils: Pupils are equal, round, and reactive to light.  Cardiovascular:      Rate and Rhythm: Normal rate and regular rhythm.     Heart sounds: Normal heart sounds.  Pulmonary:     Effort: Pulmonary effort is normal. No tachypnea, accessory muscle usage or respiratory distress.     Breath sounds: Normal breath sounds. Decreased air movement and transmitted upper airway sounds present. No wheezing, rhonchi or rales.     Comments: Decreased air movement at the bases.  Chest:     Chest wall: No tenderness.  Lymphadenopathy:  Cervical: No cervical adenopathy.  Skin:    Coloration: Skin is not pale.     Findings: No abrasion, erythema, petechiae or rash. Rash is not papular, urticarial or vesicular.  Neurological:     Mental Status: He is alert.  Psychiatric:        Behavior: Behavior is cooperative.      Diagnostic studies:    Spirometry: results abnormal (FEV1: 2.26/61%, FVC: 2.98/62%, FEV1/FVC: 76%).    This is stable compared to his recent spirometric findings.    Allergy Studies: none       Marty Shaggy, MD  [facility] 

## 2024-01-21 NOTE — Patient Instructions (Addendum)
 1. Recurrent infections - with isolated low IgG and a B cell memory defect - on prophylactic antibiotic  - Thankfully you have not had any recent infections. - Ok to stay off of doxycycline .   2. Asthma-COPD overlap syndrome - Lung testing looks stable.  - We are going to change from Trelegy to Breztri  two puffs twice daily. - We could consider doing another biologic for asthma, but let's try this too.  - Spacer teaching reviewed.  - Daily controller medication(s): Breztri  two puffs twice daily with spacer and Singulair  10mg  daily and Dupixent  every two weeks - Prior to physical activity: albuterol  2 puffs 10-15 minutes before physical activity - Rescue medications: albuterol  1-2 puffs every 4-6 hours as needed or DuoNeb nebulizer one vial every 4-6 hours as needed - Asthma control goals:  * Full participation in all desired activities (may need albuterol  before activity) * Albuterol  use two time or less a week on average (not counting use with activity) * Cough interfering with sleep two time or less a month * Oral steroids no more than once a year * No hospitalizations   3. Allergic rhinitis (sweet vernal grass, box elder, cat, weeds, ragweed, molds, cockroach, dust mite) - Continue with saline mist 1-2 times daily.  - Continue Patanase  two sprays per nostril twice daily. - Continue with Ipratropium one spray per nostril every 8 hours as needed for runny noses.  - Continue with Cetirizine  10mg  daily as needed for runny nose, sneezing, itchy watery eyes.  - Continue with Singulair  (montelukast ) 10mg  in the evening.   4. GERD - Stop the famotidine  20mg  twice daily.  - Add on Protonix  40 mg daily to see if this helps with any improvement in symptoms at that time.   5. Return in about 3 months (around 04/22/2024). You can have the follow up appointment with Dr. Iva or a Nurse Practicioner (our Nurse Practitioners are excellent and always have Physician oversight!).    Please  inform us  of any Emergency Department visits, hospitalizations, or changes in symptoms. Call us  before going to the ED for breathing or allergy symptoms since we might be able to fit you in for a sick visit. Feel free to contact us  anytime with any questions, problems, or concerns.  It was a pleasure to see you again today!  Websites that have reliable patient information: 1. American Academy of Asthma, Allergy, and Immunology: www.aaaai.org 2. Food Allergy Research and Education (FARE): foodallergy.org 3. Mothers of Asthmatics: http://www.asthmacommunitynetwork.org 4. American College of Allergy, Asthma, and Immunology: www.acaai.org      "Like" us  on Facebook and Instagram for our latest updates!      A healthy democracy works best when [employer] participate! Make sure you are registered to vote! If you have moved or changed any of your contact information, you will need to get this updated before voting! Scan the QR codes below to learn more!

## 2024-01-26 ENCOUNTER — Other Ambulatory Visit: Payer: Self-pay

## 2024-01-26 ENCOUNTER — Encounter: Payer: Self-pay | Admitting: Allergy & Immunology

## 2024-02-02 ENCOUNTER — Encounter: Payer: Self-pay | Admitting: Podiatry

## 2024-02-02 ENCOUNTER — Ambulatory Visit (INDEPENDENT_AMBULATORY_CARE_PROVIDER_SITE_OTHER): Admitting: Podiatry

## 2024-02-02 DIAGNOSIS — D2371 Other benign neoplasm of skin of right lower limb, including hip: Secondary | ICD-10-CM | POA: Diagnosis not present

## 2024-02-02 DIAGNOSIS — M7751 Other enthesopathy of right foot: Secondary | ICD-10-CM

## 2024-02-02 DIAGNOSIS — M722 Plantar fascial fibromatosis: Secondary | ICD-10-CM

## 2024-02-02 DIAGNOSIS — D2372 Other benign neoplasm of skin of left lower limb, including hip: Secondary | ICD-10-CM

## 2024-02-02 DIAGNOSIS — M79676 Pain in unspecified toe(s): Secondary | ICD-10-CM

## 2024-02-02 DIAGNOSIS — M7752 Other enthesopathy of left foot: Secondary | ICD-10-CM | POA: Diagnosis not present

## 2024-02-02 DIAGNOSIS — B351 Tinea unguium: Secondary | ICD-10-CM | POA: Diagnosis not present

## 2024-02-02 MED ORDER — DEXAMETHASONE SODIUM PHOSPHATE 120 MG/30ML IJ SOLN
4.0000 mg | Freq: Once | INTRAMUSCULAR | Status: AC
  Administered 2024-02-02: 4 mg via INTRA_ARTICULAR

## 2024-02-02 MED ORDER — TRIAMCINOLONE ACETONIDE 40 MG/ML IJ SUSP
40.0000 mg | Freq: Once | INTRAMUSCULAR | Status: AC
  Administered 2024-02-02: 40 mg

## 2024-02-03 ENCOUNTER — Telehealth: Payer: Self-pay | Admitting: Neurology

## 2024-02-03 ENCOUNTER — Encounter: Payer: Self-pay | Admitting: Neurology

## 2024-02-03 ENCOUNTER — Ambulatory Visit: Admitting: Neurology

## 2024-02-03 VITALS — BP 166/99 | HR 65 | Ht 72.0 in | Wt 344.0 lb

## 2024-02-03 DIAGNOSIS — R4701 Aphasia: Secondary | ICD-10-CM | POA: Diagnosis not present

## 2024-02-03 DIAGNOSIS — F09 Unspecified mental disorder due to known physiological condition: Secondary | ICD-10-CM | POA: Diagnosis not present

## 2024-02-03 DIAGNOSIS — R413 Other amnesia: Secondary | ICD-10-CM | POA: Diagnosis not present

## 2024-02-03 DIAGNOSIS — Z820 Family history of epilepsy and other diseases of the nervous system: Secondary | ICD-10-CM

## 2024-02-03 DIAGNOSIS — G2581 Restless legs syndrome: Secondary | ICD-10-CM

## 2024-02-03 NOTE — Progress Notes (Signed)
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 capsule Take 0.4 mg by mouth at bedtime.     tirzepatide  (MOUNJARO ) 10 MG/0.5ML Pen Inject 10 mg into the skin once a week. 2 mL 2   traZODone  (DESYREL ) 50 MG tablet Take 1 tablet (50 mg total) by mouth at bedtime. 90 tablet 1   triamcinolone  ointment (KENALOG ) 0.1 % Apply 1 Application topically 2 (two) times daily. 454 g 0   trospium (SANCTURA) 20 MG tablet Take 20 mg by mouth 2 (two) times daily. In the evening and at bedtime     TURMERIC PO Take  2,000 mg by mouth 3 (three) times daily. 1000 mg each     valsartan  (DIOVAN ) 160 MG tablet Take 1 tablet (160 mg total) by mouth 2 (two) times daily. 60 tablet 5   zinc  gluconate 50 MG tablet Take 50 mg by mouth 3 (three) times daily.     No current facility-administered medications for this visit.    Allergies as of 02/03/2024 - Review Complete 02/03/2024  Allergen Reaction Noted   Bee venom Swelling 08/13/2015   Cymbalta [duloxetine hcl] Shortness Of Breath 01/31/2011   Ppd [tuberculin purified protein derivative] Other (See Comments) 04/13/2013   Calan [verapamil] Other (See Comments) 05/10/2015   Tricor [fenofibrate] Other (See Comments) 01/31/2011   Claritin  [loratadine ] Other  (See Comments) 07/16/2016   Levaquin  [levofloxacin ] Diarrhea 07/16/2016   Tuberculin, ppd Other (See Comments) 04/06/2014    Vitals: BP (!) 166/99   Pulse 65   Ht 6' (1.829 m)   Wt (!) 344 lb (156 kg)   BMI 46.65 kg/m  Last Weight:  Wt Readings from Last 1 Encounters:  02/03/24 (!) 344 lb (156 kg)   Last Height:   Ht Readings from Last 1 Encounters:  02/03/24 6' (1.829 m)     Neuro: Detailed Neurologic Exam  Speech:    Speech is normal; fluent and spontaneous with normal comprehension.  Cognition:    02/03/2024   12:56 PM  Montreal Cognitive Assessment   Visuospatial/ Executive (0/5) 4  Naming (0/3) 3  Attention: Read list of digits (0/2) 2  Attention: Read list of letters (0/1) 0  Attention: Serial 7 subtraction starting at 100 (0/3) 3  Language: Repeat phrase (0/2) 2  Language : Fluency (0/1) 1  Abstraction (0/2) 2  Delayed Recall (0/5) 4  Orientation (0/6) 6  Total 27    Cranial Nerves:    The pupils are equal, round, and reactive to light. Visual fields are full Extraocular movements are intact.  The face is symmetric with normal sensation. The palate elevates in the midline. Hearing intact. Voice is normal. Shoulder shrug is normal. The tongue has normal motion without fasciculations.   Coordination: normal  Gait: Antalgic, wide based, with gait  Motor Observation:   no involuntary movements noted. Tone:    Appears normal  Posture:    Posture is normal. normal erect    Strength: Left hemiparesis , chronic       Assessment/Plan:  63 year old man with extensive past medical history including severe obstructive sleep apnea (AHI 57.2, O? nadir 81%, 55 minutes <=88%), morbid obesity, diabetes, hypertension, hyperlipidemia, COPD/asthma overlap, epilepsy, migraines (on Botox  with improvement), depression/anxiety, polypharmacy, and prior head injuries.  He reports progressive memory decline over decades, with worsening short-term memory, word-finding  difficulty, and functional decline. MoCA is 27/30 (normal) but with subjective and collateral reports of worsening cognition. Strong family history of dementia (Alzheimer's disease in maternal uncles and paternal aunt; late-onset dementia in mother at ~69). He is already on donepezil  and memantine .  Risk factors for cognitive decline include:  Severe untreated OSA (major contributor to cognitive impairment, vascular risk, mood disorders).  Vascular risk factors: HTN, HLD, DM, obesity.  Mood disorders (depression/anxiety).  History of head trauma and concussions.  Polypharmacy.  MRI brain and serum A?42/40 ratio pending to assess for Alzheimer's disease pathology. Neurodegenerative process vs multifactorial impairment (OSA, vascular, psychiatric, medication effects). Neuropsychological testing will be considered  Plan 1. Cognitive Evaluation & Monitoring  MRI Brain: Ordered to evaluate for structural or vascular contributions.  Serum Alzheimer's  biomarkers (A?42/40 ratio): Ordered. If positive, consider amyloid PET.  Formal neurocognitive testing: consider after results above  Continue donepezil  and memantine  for now; monitor efficacy and tolerability.  2. Sleep Apnea / Weight Management  Severe, untreated OSA is the most urgent modifiable risk factor.  Strongly reinforced importance of treatment to patient.  CPAP previously poorly tolerated (night terrors). Consider:  Mask refitting or desensitization strategies  Bilevel PAP trial  Evaluation by sleep medicine for alternative therapies (oral appliance, positional therapy).  Inspire not feasible given BMI; patient is on Mounjaro  for weight loss--continue, with goal of reducing BMI for possible future eligibility.  Coordinate with sleep medicine and weight management teams.  3. Vascular & Metabolic Risk Reduction  Optimize blood pressure, diabetes, and lipid control in coordination with PCP.  Encourage weight loss,  exercise, and Mediterranean-style diet (which also benefits cognition).  4. Seizure & Headache Management  Continue antiepileptic regimen for partial complex seizures.  Continue Botox  therapy for migraine, as this has been effective.  5. Psychiatric / Behavioral Health  Address depression and anxiety, which may contribute to cognitive inefficiency.  Continue current psychiatric medications, but review for polypharmacy burden and anticholinergic load. Consider psychiatry referral for optimization.  6. Patient Education & Safety  Discussed the multifactorial nature of his memory problems. Reassured that normal MoCA and pending biomarkers help guide against over-attribution to Alzheimer's disease at this stage.  Strongly emphasized the gravity of untreated OSA and its impact on cognition, cardiovascular risk, and mortality.  Reinforced fall precautions given comorbidities, gait risk, and polypharmacy.   Orders Placed This Encounter  Procedures   MR BRAIN W WO CONTRAST   Vitamin B1   Ferritin   ATN PROFILE   APOE Alzheimer's Risk    Cc: de [place], Quintin PARAS, MD,  de [place], Quintin PARAS, MD  Onetha Epp, MD  [facility] [address] [place], [place] [address]  Phone [phone] Fax [phone]   I spent 41 minutes of face-to-face and non-face-to-face time with patient on the  1. Memory loss   2. RLS (restless legs syndrome)   3. Short-term memory loss   4. Aphasia   5. Progressive cognitive dysfunction   6. FHx: Alzheimer's disease     diagnosis.  This included previsit chart review, lab review, study review, order entry, electronic health record documentation, patient education on the different diagnostic and therapeutic options, counseling and coordination of care, risks and benefits of management, compliance, or risk factor reduction

## 2024-02-03 NOTE — Patient Instructions (Addendum)
 Look for alzheimer's biomarkers in the blood MRI of the brain w/wo contrast Check ferritin levels for RLS If biomarkers are positive with order CT PET Amyloid Also consider formal neurocognitive testing based on above findings

## 2024-02-03 NOTE — Telephone Encounter (Signed)
 Pt scheduled for MR brain w/wo contrast at [facility] for 02/17/24 at 3pm. Pt is requesting medication to help with claustrophobia and is aware to have driver for appt if medication is taken.

## 2024-02-03 NOTE — Progress Notes (Signed)
 He presents today for follow-up of his painful feet.  He states that he is here for diabetic check nail trimming and his heels have been hurting again.  He states that this area over here is painful as he points to the plantar aspect of the subfifth metatarsal head left foot.  Objective: Vital signs are stable oriented x 3.  Pulses are palpable.  Toenails are long and dystrophic sharply incurvated.  Diabetic peripheral neuropathy right over left.  Benign hyperkeratotic neoplasm subfifth met head left.  Painful area of fluctuance consistent with bursitis in the same area.  Mild surrounding erythema but no open lesions.  Also has pain palpation medial calcaneal tubercles bilateral.  Assessment: Plantar fasciitis bilateral diabetes mellitus with diabetic peripheral neuropathy.  Bursitis benign skin lesions and painful mycotic nails.  Plan: Debrided nails 1 through 5 bilaterally injected the bilateral heels today 10 mg Kenalog  5 mg Marcaine  point maximal tenderness.  Injected 2 mg of dexamethasone  into the bursa today debrided benign skin lesion.  Follow-up with him in 3 months.

## 2024-02-05 MED ORDER — ALPRAZOLAM 0.25 MG PO TABS: ORAL_TABLET | ORAL | 0 refills | Status: DC

## 2024-02-05 NOTE — Telephone Encounter (Signed)
Sent in thank you

## 2024-02-05 NOTE — Addendum Note (Signed)
 Addended by: Avid Guillette B on: 02/05/2024 08:40 AM   Modules accepted: Orders

## 2024-02-08 ENCOUNTER — Other Ambulatory Visit: Payer: Self-pay | Admitting: Orthopaedic Surgery

## 2024-02-11 ENCOUNTER — Encounter (HOSPITAL_BASED_OUTPATIENT_CLINIC_OR_DEPARTMENT_OTHER): Payer: Self-pay | Admitting: Family Medicine

## 2024-02-11 DIAGNOSIS — F3341 Major depressive disorder, recurrent, in partial remission: Secondary | ICD-10-CM

## 2024-02-11 DIAGNOSIS — F0150 Vascular dementia without behavioral disturbance: Secondary | ICD-10-CM

## 2024-02-11 NOTE — Telephone Encounter (Signed)
 Please see mychart message sent by pt and advise.

## 2024-02-12 MED ORDER — SERTRALINE HCL 100 MG PO TABS: 200.0000 mg | ORAL_TABLET | Freq: Every day | ORAL | 1 refills | Status: DC

## 2024-02-12 MED ORDER — DONEPEZIL HCL 23 MG PO TABS: 23.0000 mg | ORAL_TABLET | Freq: Every day | ORAL | 1 refills | Status: AC

## 2024-02-13 LAB — ATN PROFILE
A -- Beta-amyloid 42/40 Ratio: 0.11 (ref >0.102)
Beta-amyloid 40: 236.59 pg/mL
Beta-amyloid 42: 26.14 pg/mL
N -- NfL, Plasma: 1.12 pg/mL (ref 0.00–3.65)
T -- p-tau181: 0.78 pg/mL (ref 0.00–0.97)

## 2024-02-13 LAB — VITAMIN B1: Thiamine: 256.4 nmol/L — ABNORMAL HIGH (ref 66.5–200.0)

## 2024-02-13 LAB — APOE ALZHEIMER'S RISK

## 2024-02-13 LAB — FERRITIN: Ferritin: 61 ng/mL (ref 30–400)

## 2024-02-14 ENCOUNTER — Other Ambulatory Visit: Payer: Self-pay | Admitting: Cardiovascular Disease

## 2024-02-14 DIAGNOSIS — I10 Essential (primary) hypertension: Secondary | ICD-10-CM

## 2024-02-15 ENCOUNTER — Other Ambulatory Visit: Payer: Self-pay | Admitting: Medical Genetics

## 2024-02-15 ENCOUNTER — Ambulatory Visit: Payer: Self-pay | Admitting: Neurology

## 2024-02-16 ENCOUNTER — Other Ambulatory Visit: Payer: Self-pay

## 2024-02-16 ENCOUNTER — Ambulatory Visit (HOSPITAL_BASED_OUTPATIENT_CLINIC_OR_DEPARTMENT_OTHER): Attending: Orthopaedic Surgery | Admitting: Physical Therapy

## 2024-02-16 ENCOUNTER — Encounter (HOSPITAL_BASED_OUTPATIENT_CLINIC_OR_DEPARTMENT_OTHER): Payer: Self-pay | Admitting: Physical Therapy

## 2024-02-16 DIAGNOSIS — M25562 Pain in left knee: Secondary | ICD-10-CM | POA: Diagnosis not present

## 2024-02-16 DIAGNOSIS — I10 Essential (primary) hypertension: Secondary | ICD-10-CM | POA: Diagnosis not present

## 2024-02-16 DIAGNOSIS — M6281 Muscle weakness (generalized): Secondary | ICD-10-CM | POA: Insufficient documentation

## 2024-02-16 DIAGNOSIS — M25561 Pain in right knee: Secondary | ICD-10-CM | POA: Diagnosis not present

## 2024-02-16 DIAGNOSIS — G8929 Other chronic pain: Secondary | ICD-10-CM | POA: Diagnosis not present

## 2024-02-16 DIAGNOSIS — R2689 Other abnormalities of gait and mobility: Secondary | ICD-10-CM | POA: Insufficient documentation

## 2024-02-16 DIAGNOSIS — J9611 Chronic respiratory failure with hypoxia: Secondary | ICD-10-CM | POA: Diagnosis not present

## 2024-02-16 NOTE — Therapy (Addendum)
 OUTPATIENT PHYSICAL THERAPY LOWER EXTREMITY EVALUATION   Patient Name: Mark Harrell. MRN: [id number] DOB:February 13, 1961, 63 y.o., male Today's Date: 02/17/2024  END OF SESSION:  PT End of Session - 02/16/24 1711     Visit Number 1    Date for Recertification  04/29/24    Authorization Type [employer]    PT Start Time 1530    PT Stop Time 1610    PT Time Calculation (min) 40 min    Activity Tolerance Patient tolerated treatment well    Behavior During Therapy WFL for tasks assessed/performed          Past Medical History:  Diagnosis Date   Anesthesia complication requiring reversal agent administration    ? from central apnea, very difficult to get off vent   Anxiety    Arthritis    osteo   Asthma    BPH (benign prostatic hyperplasia)    Complication of anesthesia    difficulty waking , they twlight me because of my respiratory problems    COPD (chronic obstructive pulmonary disease) (HCC)    COVID    moderate case 2023   Depression    Diabetes mellitus (HCC) 08/31/2023   DVT (deep venous thrombosis) (HCC) 2023   right leg   Dyspnea    on exertion   Enlarged heart    Family history of adverse reaction to anesthesia    mother trouble waking up, and heart stopped   GERD (gastroesophageal reflux disease)    Headache    botox  injections for headaches   Hyperlipidemia    Hypertension    Hypogonadism male    IBS (irritable bowel syndrome)    Kidney stones    Memory difficulties    short term memories   Neuropathy    Obesity    On home oxygen  therapy    on 2 liter   OSA (obstructive sleep apnea)    not using cpap   Paralysis (HCC)    left hand - small    due to accident with an head injury   Pneumonia    Pre-diabetes    Prostatitis    Past Surgical History:  Procedure Laterality Date   ABDOMINAL SURGERY     ANKLE FRACTURE SURGERY Right    ANTERIOR CERVICAL DECOMP/DISCECTOMY FUSION N/A 05/23/2019   Procedure: ANTERIOR CERVICAL DISCECTOMY FUSION CERVICAL FIVE  THROUGH CERVICAL SIX AND CERVICAL SIX THROUGH CERVICAL SEVEN;  Surgeon: Lucilla Lynwood BRAVO, MD;  Location: [facility] OR;  Service: Orthopedics;  Laterality: N/A;   BRONCHIAL BRUSHINGS  04/28/2022   Procedure: BRONCHIAL BRUSHINGS;  Surgeon: Shelah Lamar RAMAN, MD;  Location: [facility] ENDOSCOPY;  Service: Pulmonary;;   BRONCHIAL NEEDLE ASPIRATION BIOPSY  04/28/2022   Procedure: BRONCHIAL NEEDLE ASPIRATION BIOPSIES;  Surgeon: Shelah Lamar RAMAN, MD;  Location: [facility] ENDOSCOPY;  Service: Pulmonary;;   COLONOSCOPY     CYSTOSCOPY     [place]   KNEE ARTHROSCOPY WITH MEDIAL MENISECTOMY Left 01/02/2017   Procedure: LEFT KNEE ARTHROSCOPY WITH PARTIAL MEDIAL MENISCECTOMY;  Surgeon: Vernetta Lonni GRADE, MD;  Location: [facility] ORS;  Service: Orthopedics;  Laterality: Left;   POSTERIOR CERVICAL FUSION/FORAMINOTOMY N/A 08/20/2020   Procedure: LEFT CERVICAL SIX THROUGH SEVEN  AND CERVICAL SEVEN THROUGH THORACIC ONE  FORAMINOTOMIES WITH EXCISION OF DISC HERNIATION LEFT CERVICAL SEVEN THROUGH THORACIC ONE;  Surgeon: Lucilla Lynwood BRAVO, MD;  Location: [facility] OR;  Service: Orthopedics;  Laterality: N/A;   TONSILLECTOMY     TURBINATE RESECTION  2007   UVULOPALATOPHARYNGOPLASTY     VIDEO  BRONCHOSCOPY WITH RADIAL ENDOBRONCHIAL ULTRASOUND  04/28/2022   Procedure: VIDEO BRONCHOSCOPY WITH RADIAL ENDOBRONCHIAL ULTRASOUND;  Surgeon: Shelah Lamar RAMAN, MD;  Location: [facility] ENDOSCOPY;  Service: Pulmonary;;   Patient Active Problem List   Diagnosis Date Noted   Acute on chronic diastolic heart failure (HCC) 09/30/2023   Diabetes mellitus (HCC) 08/31/2023   Rash 08/31/2023   Cognitive changes 08/31/2023   Unilateral primary osteoarthritis, left knee 02/24/2022   Unilateral primary osteoarthritis, right knee 02/24/2022   Abnormal CT of the chest 01/28/2022   History of adenomatous polyp of colon 01/08/2022   Hypokalemia 09/26/2021   Dyspnea 09/25/2021   Acute deep vein thrombosis (DVT) of right lower extremity (HCC) 09/24/2021   OSA (obstructive sleep apnea)  09/24/2021   Herniation of cervical intervertebral disc with radiculopathy    Status post cervical discectomy 08/20/2020   Tracheomalacia 05/26/2019   Acquired tracheomalacia 05/26/2019   HNP (herniated nucleus pulposus) with myelopathy, cervical 05/23/2019    Class: Chronic   Spinal stenosis of cervical region 05/23/2019    Class: Chronic   Status post cervical spinal fusion 05/23/2019   Chronic respiratory failure with hypoxia (HCC) 04/12/2018   Bilateral lower extremity edema 12/29/2017   Hymenoptera allergy 11/10/2017   Family history of colonic polyps 08/07/2017   Diverticulosis 08/07/2017   Flatus 08/07/2017   Myofascial pain 08/06/2017   Seasonal and perennial allergic rhinitis 04/23/2017   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/2017   Cervicalgia 04/01/2017   Recurrent pneumonia 03/09/2017   Other spondylosis with radiculopathy, cervical region 02/09/2017   Depression with anxiety 01/04/2017   Memory difficulty 12/05/2016   Diarrhea in adult patient 12/03/2016   Internal hemorrhoids 12/03/2016   Chronic migraine without aura without status migrainosus, not intractable 10/30/2016   Lumbar radiculopathy 05/15/2016   Osteoarthritis of spine with radiculopathy, lumbar region 05/15/2016   Risk for falls 05/15/2016   Recurrent infections 03/29/2016   Chronic nonseasonal allergic rhinitis due to pollen 03/29/2016   Polypharmacy 01/16/2016   Morbid obesity with BMI of 45.0-49.9, adult (HCC) 03/06/2015   SDAT 02/05/2015   OSA and COPD overlap syndrome (HCC) 02/05/2015   Medication management 08/02/2014   GERD (gastroesophageal reflux disease) 05/09/2014   Vitamin D  deficiency 08/01/2013   Positive TB test 07/29/2011   Diverticula of colon 05/07/2011   Hypertension 01/31/2011   Hyperlipidemia, mixed 01/31/2011   BPH (benign prostatic hyperplasia) 01/31/2011   Testosterone  Deficiency 01/31/2011   IBS (irritable bowel syndrome) 01/31/2011   Partial complex seizure disorder with  intractable epilepsy (HCC) 01/31/2011   Depression, major, recurrent, in partial remission (HCC) 01/31/2011   Asthma-COPD overlap syndrome (HCC) 01/31/2011    PCP: Quintin Crane MD  REFERRING PROVIDER: Vernetta Lonni GRADE, MD   REFERRING DIAG:  [phone] (ICD-10-CM) - Chronic pain of left knee  M25.561,G89.29 (ICD-10-CM) - Chronic pain of right knee    THERAPY DIAG:  Bilateral chronic knee pain - Plan: PT plan of care cert/re-cert  Muscle weakness (generalized)  Other abnormalities of gait and mobility  Rationale for Evaluation and Treatment: Rehabilitation  ONSET DATE: >5 yrs  SUBJECTIVE:   SUBJECTIVE STATEMENT: OA bilateral knees.  Not a surgical candidate at this time due to elevated BMI. Received steroid injections 01/18/24. Twisted my R knee the other day have basically stayed off of it. R knee hurting > left today usually the other way around. TBI 1985 with left sided le weakness; Had a fall in 2019 hurt my neck was paralyzed waist down resolved on its own, then had a cervical  fusion 2020.  Pt reports he does not do a lot of walking as he feels unsafe.   PERTINENT HISTORY: 2020: cervical decompression discectomy/fusion Acute on chronic diastolic heart failure Closed head injury 1985 with residual left side weakness (left leg drags) PAIN:  Are you having pain? Yes: NPRS scale: Right : current 2/10; walking 5/10; worst 8                                                                 Left :current 0/10; walking 1-2/10; worst 3-4/5    Pain location: Knees L>R usually today r>l Pain description: achy; sharp Aggravating factors: walking; standing Relieving factors: sitting; elevation; ice; injections  PRECAUTIONS: None  RED FLAGS: None   WEIGHT BEARING RESTRICTIONS: No  FALLS:  Has patient fallen in last 6 months? 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  DIAGNOSTIC FINDINGS:  L knee x-ray 2023: 2 views of the left knee show tricompartment arthritis mainly involving  the medial compartment of the knee.  This is moderate arthritis.   R knee x-ray 2023 2 views of the right knee show no significant malalignment.  There is mild  tricompartment arthritic changes.   PATIENT SURVEYS:  Lefs: 22/80  COGNITION: Overall cognitive status: Within functional limits for tasks assessed     SENSATION: WFL  EDEMA:  Minimal right knee  MUSCLE LENGTH: Hamstrings: tight bilaterally tested in sitting  POSTURE: rounded shoulders, forward head, and slight knee varus bilaterally  PALPATION: TTP medial joint line left knee  LOWER EXTREMITY ROM:  Active ROM Right eval Left eval  Hip flexion    Hip extension    Hip abduction    Hip adduction    Hip internal rotation    Hip external rotation    Knee flexion 105 130  Knee extension 0 0  Ankle dorsiflexion    Ankle plantarflexion    Ankle inversion    Ankle eversion     (Blank rows = not tested)  LOWER EXTREMITY MMT:  MMT Right eval Left eval  Hip flexion 20.5 32.9  Hip extension    Hip abduction    Hip adduction    Hip internal rotation    Hip external rotation    Knee flexion 4/5 53.0  Knee extension 5/5 33.8  Ankle dorsiflexion 5 5  Ankle plantarflexion    Ankle inversion    Ankle eversion     (Blank rows = not tested)  FUNCTIONAL TESTS:  Timed up and go (TUG): 28.67  4 stage balance: GAIT: Distance walked: 400 ft Assistive device utilized: Single point cane Level of assistance: Complete  Independence Comments: Pt pauses upon standing then initiates gait. Knee flex  TREATMENT  Eval Self care:Posture and Optometrist instruction    PATIENT EDUCATION:  Education details: Discussed eval findings, rehab rationale, aquatic program progression/POC and pools in area. Patient is in agreement  Person educated: Patient Education method: Explanation Education comprehension: verbalized understanding  HOME EXERCISE PROGRAM: tba  ASSESSMENT:  CLINICAL IMPRESSION: Patient is a 63 y.o. m who was seen today for physical therapy evaluation and treatment for bilateral knee oa. Pt has a long Pmx including cervical spine dysfunction, TBI and heart failure.  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 He presents with pain limited deficits in bilat knees and throughout spine affecting ROM, endurance, activity tolerance, gait, balance, and functional mobility with ADL's. Patient modifying and restricting ADL's as indicated by subjective information, objective measures and outcome measure score.  He will benefit from skilled PT intervention to improve all areas of deficits. Will see initially in aquatics then transition onto land when approp.   OBJECTIVE IMPAIRMENTS: Abnormal gait, decreased activity tolerance, decreased balance, decreased knowledge of use of DME, decreased mobility, difficulty walking, decreased ROM, decreased strength, obesity, and pain.   ACTIVITY LIMITATIONS: carrying, lifting, sitting, squatting, stairs, transfers, and locomotion level  PARTICIPATION LIMITATIONS: meal prep, cleaning, laundry, shopping, community activity, and yard work  PERSONAL FACTORS: Fitness and 3+ comorbidities: see PmHx are also affecting patient's functional outcome.   REHAB POTENTIAL: Good  CLINICAL DECISION MAKING:  Stable/uncomplicated  EVALUATION COMPLEXITY: Low   GOALS: Goals reviewed with patient? No  SHORT TERM GOALS: Target date: 03/26/24 Pt will tolerate full aquatic sessions consistently without increase in pain and with improving function to demonstrate good toleration and effectiveness of intervention.  Baseline: Goal status: INITIAL  2.  Pt will tolerate stair climbing using alternating or combination of alternating and step to pattern ascending and descending 6 steps with use of handrail  Baseline:  Goal status: INITIAL  3.  Pt will consider gaining pool access for use of the properties of water for chronic conditions maintaining mobility and minimizing pain. Baseline:  Goal status: INITIAL    LONG TERM GOALS: Target date: 04/29/24  Pt to improve on LEFS by at least 9 point to demonstrate statistically significant Improvement in function. Baseline: 22/80 Goal status: INITIAL  2. Pt will tolerate walking to and from setting and engaging in aquatic therapy session without excessive fatigue or increase in pain to demonstrate improved toleration to activity.  Baseline:  Goal status: INITIAL  3.  Pt will improve strength in RLE within 5 lbs of left to demonstrate improved overall physical function Baseline:  Goal status: INITIAL  4.  Pt will improve ROM of  R knee flex to within 5d of left to improve function, decrease risk of injury and pain and improve quality of gait Baseline: See chart Goal status: INITIAL  5.  Pt will be indep with final HEP's (land and aquatic as appropriate) for continued management of condition Baseline:  Goal status: INITIAL     PLAN:  PT FREQUENCY: 1-2x/week  PT DURATION: 10 weeks delayed start due to scheduling needs of pt  PLANNED INTERVENTIONS: 97164- PT Re-evaluation, 97750- Physical Performance Testing, 97110-Therapeutic exercises, 97530- Therapeutic activity, [id number]- Neuromuscular re-education, 97535- Self Care, [id number]- Manual therapy,  [id number]- Gait training, [phone]- Aquatic Therapy,  [phone] (1-2 muscles), 20561 (3+ muscles)- Dry Needling, Patient/Family education, Balance training, Stair training, Taping, Joint mobilization, DME instructions, Cryotherapy, and Moist heat  PLAN FOR NEXT SESSION: Aquatic: general strengthening with focus on  knees and core; balance and proprioceptive retraining; stair climbing Land same as [employer].  Ronal [place]) Kimeka Badour MPT 02/17/24 12:53 PM [facility] [address] [place], [place], [address] Phone: [phone]   Fax:  [phone]  Addend Ronal Foots) Aarron Wierzbicki MPT 03/16/24 1:07 PM [facility] [address] [place], [place], [address] Phone: [phone]   Fax:  [phone]

## 2024-02-17 ENCOUNTER — Ambulatory Visit

## 2024-02-17 DIAGNOSIS — R413 Other amnesia: Secondary | ICD-10-CM

## 2024-02-17 DIAGNOSIS — G2581 Restless legs syndrome: Secondary | ICD-10-CM

## 2024-02-17 DIAGNOSIS — F09 Unspecified mental disorder due to known physiological condition: Secondary | ICD-10-CM | POA: Diagnosis not present

## 2024-02-17 DIAGNOSIS — R4701 Aphasia: Secondary | ICD-10-CM | POA: Diagnosis not present

## 2024-02-17 DIAGNOSIS — Z820 Family history of epilepsy and other diseases of the nervous system: Secondary | ICD-10-CM

## 2024-02-17 MED ORDER — GADOBENATE DIMEGLUMINE 529 MG/ML IV SOLN
20.0000 mL | Freq: Once | INTRAVENOUS | Status: AC | PRN
  Administered 2024-02-17: 20 mL via INTRAVENOUS

## 2024-02-18 ENCOUNTER — Other Ambulatory Visit (HOSPITAL_COMMUNITY): Payer: Self-pay

## 2024-02-18 ENCOUNTER — Ambulatory Visit: Attending: Cardiology | Admitting: Pharmacist

## 2024-02-18 ENCOUNTER — Encounter: Payer: Self-pay | Admitting: Pharmacist

## 2024-02-18 VITALS — BP 118/74 | HR 74

## 2024-02-18 DIAGNOSIS — I10 Essential (primary) hypertension: Secondary | ICD-10-CM | POA: Diagnosis not present

## 2024-02-18 DIAGNOSIS — I5033 Acute on chronic diastolic (congestive) heart failure: Secondary | ICD-10-CM | POA: Diagnosis not present

## 2024-02-18 MED ORDER — OMRON 3 SERIES BP MONITOR DEVI
1.0000 | Freq: Every day | 0 refills | Status: AC
Start: 2024-02-18 — End: ?
  Filled 2024-02-18: qty 1, 30d supply, fill #0

## 2024-02-18 MED ORDER — FUROSEMIDE 20 MG PO TABS
20.0000 mg | ORAL_TABLET | Freq: Every day | ORAL | 3 refills | Status: AC
Start: 2024-02-18 — End: ?

## 2024-02-18 NOTE — Progress Notes (Signed)
 Patient ID: Mark Harrell Mark Harrell.                 DOB: [birth year]                      MRN: [id number]     HPI: Mark Harrell. is a 63 y.o. male referred by Dr. Court to HTN clinic. PMH is significant for HTN, DVT, CHF, COPD, T2DM, and obesity.  Patient presents today for HTN follow up. Patient was last seen by PharmD ~ 1 month ago at which time his valsartan  was increased to 160 mg twice daily. He reports no changes in his chronic knee, back, and generalized arthritis symptoms. He has been experiencing a cough and has been taking Coricidin for relief. Additionally, he states that he has been taking an extra 20 mg dose of furosemide  on days where he is experiencing a lot of swelling. He reports the extra dose has been more helpful with the swelling. He occasionally has SOB which he considers normal for him, and denies any headache or chest pain.  Current HTN meds:  Metoprolol  25mg  TID Hydralazine  25mg  TID Valsartan  160mg  twice daily  Scr (01/18/24): 1.06 mg/dL; Crcl: 110 ml/min Potassium (01/18/24): 4.2 mmol/L  Home BP readings: within the last month (no dates): 174/93 176/112 124/81 160/96 134/76 174/106 180/115 177/105 171/102 171/102 184/93 152/94  BP goal: <130/80  Wt Readings from Last 3 Encounters:  02/03/24 (!) 344 lb (156 kg)  01/18/24 (!) 341 lb (154.7 kg)  11/05/23 (!) 350 lb 9.6 oz (159 kg)   BP Readings from Last 3 Encounters:  02/18/24 118/74  02/03/24 (!) 166/99  01/21/24 (!) 152/100   Pulse Readings from Last 3 Encounters:  02/18/24 74  02/03/24 65  01/21/24 85    Renal function: CrCl cannot be calculated (Patient's most recent lab result is older than the maximum 21 days allowed.).  Past Medical History:  Diagnosis Date   Anesthesia complication requiring reversal agent administration    ? from central apnea, very difficult to get off vent   Anxiety    Arthritis    osteo   Asthma    BPH (benign prostatic hyperplasia)    Complication of anesthesia     difficulty waking , they twlight me because of my respiratory problems    COPD (chronic obstructive pulmonary disease) (HCC)    COVID    moderate case 2023   Depression    Diabetes mellitus (HCC) 08/31/2023   DVT (deep venous thrombosis) (HCC) 2023   right leg   Dyspnea    on exertion   Enlarged heart    Family history of adverse reaction to anesthesia    mother trouble waking up, and heart stopped   GERD (gastroesophageal reflux disease)    Headache    botox  injections for headaches   Hyperlipidemia    Hypertension    Hypogonadism male    IBS (irritable bowel syndrome)    Kidney stones    Memory difficulties    short term memories   Neuropathy    Obesity    On home oxygen  therapy    on 2 liter   OSA (obstructive sleep apnea)    not using cpap   Paralysis (HCC)    left hand - small    due to accident with an head injury   Pneumonia    Pre-diabetes    Prostatitis     Current Outpatient Medications on File Prior to  Visit  Medication Sig Dispense Refill   acetaminophen  (TYLENOL ) 500 MG tablet Take 1,000 mg by mouth 3 (three) times daily.     albuterol  (VENTOLIN  HFA) 108 (90 Base) MCG/ACT inhaler Inhale 1-2 puffs into the lungs every 6 (six) hours as needed for wheezing or shortness of breath. 18 each 1   ALPRAZolam  (XANAX ) 0.25 MG tablet Take 1-2 tabs (0.25mg -0.50mg ) 30-60 minutes before procedure. May repeat if needed.Do not drive. 4 tablet 0   anastrozole  (ARIMIDEX ) 1 MG tablet Take 1 tablet (1 mg total) by mouth daily. 90 tablet 1   ascorbic acid  (VITAMIN C) 1000 MG tablet Take 1,000 mg by mouth every evening.     benzonatate  (TESSALON ) 100 MG capsule Take 200 mg by mouth.     botulinum toxin Type A  (BOTOX ) 200 units injection Provider to inject 155 units into the muscles of the head and neck every 12 weeks. Discard remainder. 1 each 3   budesonide  (PULMICORT ) 0.5 MG/2ML nebulizer solution INHALE CONTENTS OF 1 VIAL VIA NEBULIZER 2 (TWO) TIMES DAILY AS NEEDED. 360 mL  1   budesonide -glycopyrrolate-formoterol  (BREZTRI  AEROSPHERE) 160-9-4.8 MCG/ACT AERO inhaler Inhale 2 puffs into the lungs in the morning and at bedtime. 1 each 5   Calcium  Carb-Cholecalciferol  (CALCIUM  + VITAMIN D3 PO) Take 1 tablet by mouth daily.     celecoxib  (CELEBREX ) 200 MG capsule TAKE 1 CAPSULE (200 MG TOTAL) BY MOUTH EVERY 12 (TWELVE) HOURS. 60 capsule 2   cetaphil (CETAPHIL) lotion Apply 1 Application topically 2 (two) times daily.     cetirizine  (ZYRTEC ) 10 MG tablet Take 1 tablet (10 mg total) by mouth daily. 90 tablet 1   Cholecalciferol  (VITAMIN D3) 125 MCG (5000 UT) TABS Take 5,000-10,000 Units by mouth See admin instructions. Take 10,000 in the morning and 5,000 units in the evening     Cinnamon  500 MG capsule Take 1,000 mg by mouth 3 (three) times daily.     Coenzyme Q10 (CO Q 10 PO) Take 600 mg by mouth daily.     cyclobenzaprine  (FLEXERIL ) 10 MG tablet TAKE 1 TABLET 3 TIMES A DAY ONLY IF NEEDED FOR MUSCLE SPASM (NECK) 90 tablet 2   Dextromethorphan HBr (DELSYM PO) Take by mouth.     diclofenac  Sodium (VOLTAREN ) 1 % GEL Apply 2-4 g topically 2 (two) times daily as needed (pain).     DM-APAP-CPM (CORICIDIN HBP PO) Take by mouth.     donepezil  (ARICEPT ) 23 MG TABS tablet Take 1 tablet (23 mg total) by mouth daily. 90 tablet 1   dupilumab  (DUPIXENT ) 300 MG/2ML prefilled syringe Inject 600 mg into the skin once for 1 dose. THEN 300MG  EVERY 14 DAYS 4 mL 11   EPINEPHrine  0.3 mg/0.3 mL IJ SOAJ injection Inject 0.3 mg into the muscle as needed for anaphylaxis. 2 each 2   furosemide  (LASIX ) 20 MG tablet Take 40mg  (2 tablets) daily for 7 days, then 20mg  daily. 100 tablet 3   Guaifenesin  1200 MG TB12 Take 1,200 mg by mouth 3 (three) times daily.     hydrALAZINE  (APRESOLINE ) 25 MG tablet Take 1 tablet (25 mg total) by mouth 3 (three) times daily. 270 tablet 3   ibuprofen (ADVIL) 800 MG tablet SMARTSIG:1 Tablet(s) By Mouth Every 12 Hours PRN     ipratropium (ATROVENT ) 0.03 % nasal spray  Place 2 sprays into the nose 3 (three) times daily as needed for rhinitis. 90 mL 1   ipratropium-albuterol  (DUONEB) 0.5-2.5 (3) MG/3ML SOLN Take 3 mLs by nebulization 2 (two)  times daily as needed. 120 mL 1   Krill Oil 500 MG CAPS Take 500 mg by mouth 2 (two) times daily.     losartan  (COZAAR ) 100 MG tablet Take 100 mg by mouth daily.     Magnesium  500 MG CAPS Take 500 mg by mouth 3 (three) times daily.     memantine  (NAMENDA ) 10 MG tablet Take 1 tablet (10 mg total) by mouth 2 (two) times daily. 270 tablet 3   metFORMIN  (GLUCOPHAGE -XR) 500 MG 24 hr tablet TAKES 2 TABLETS TWICE A DAY WITH MEALS FOR DIABETES 360 tablet 3   metoprolol  tartrate (LOPRESSOR ) 25 MG tablet TAKE 1 TABLET (25 MG TOTAL) BY MOUTH IN THE MORNING, AT NOON, AND AT BEDTIME. 270 tablet 1   Misc Natural Products (GLUCOS-CHONDROIT-MSM COMPLEX) TABS Take 2 tablets by mouth 3 (three) times daily.     montelukast  (SINGULAIR ) 10 MG tablet Take 1 tablet (10 mg total) by mouth at bedtime. 90 tablet 0   Multiple Vitamins-Minerals (MULTIVITAMIN WITH MINERALS) tablet Take 1 tablet by mouth every morning.     Multiple Vitamins-Minerals (PRESERVISION AREDS 2) CAPS Take 1 capsule by mouth 2 (two) times daily. Evening and night     Olopatadine  HCl 0.6 % SOLN PLACE 2 SPRAYS INTO THE NOSE 2 (TWO) TIMES DAILY AS NEEDED. 91.5 g 1   OXYGEN  Inhale 2 L into the lungs.     oxymetazoline  (AFRIN) 0.05 % nasal spray Place 1 spray into both nostrils at bedtime.     pantoprazole  (PROTONIX ) 40 MG tablet Take 1 tablet (40 mg total) by mouth daily. 90 tablet 1   pentosan polysulfate (ELMIRON ) 100 MG capsule Take 100 mg by mouth 3 (three) times daily.     potassium chloride  SA (KLOR-CON  M20) 20 MEQ tablet Take 1 tablet (20 mEq total) by mouth daily. 90 tablet 3   pravastatin  (PRAVACHOL ) 40 MG tablet TAKE 1 TABLET BY MOUTH EVERY DAY AT BEDTIME FOR CHOLESTEROL 90 tablet 3   pregabalin  (LYRICA ) 150 MG capsule TAKE 1 CAPSULE 3 TIMES A DAY FOR CHRONIC PAIN 270  capsule 1   promethazine -dextromethorphan (PROMETHAZINE -DM) 6.25-15 MG/5ML syrup TAKE 5 MLS BY MOUTH 4 TIMES A DAY AS NEEDED FOR COUGH 240 mL 1   sertraline  (ZOLOFT ) 100 MG tablet Take 2 tablets (200 mg total) by mouth daily. 180 tablet 1   sodium chloride  (OCEAN) 0.65 % SOLN nasal spray Place 1 spray into both nostrils 4 (four) times daily as needed for congestion. Uses each time before other nasal sprays     tadalafil  (CIALIS ) 5 MG tablet Take 5 mg by mouth daily.      tamsulosin (FLOMAX) 0.4 MG CAPS capsule Take 0.4 mg by mouth at bedtime.     tirzepatide  (MOUNJARO ) 10 MG/0.5ML Pen Inject 10 mg into the skin once a week. 2 mL 2   traZODone  (DESYREL ) 50 MG tablet Take 1 tablet (50 mg total) by mouth at bedtime. 90 tablet 1   triamcinolone  ointment (KENALOG ) 0.1 % Apply 1 Application topically 2 (two) times daily. 454 g 0   trospium (SANCTURA) 20 MG tablet Take 20 mg by mouth 2 (two) times daily. In the evening and at bedtime     TURMERIC PO Take 2,000 mg by mouth 3 (three) times daily. 1000 mg each     valsartan  (DIOVAN ) 160 MG tablet Take 1 tablet (160 mg total) by mouth 2 (two) times daily. 60 tablet 5   zinc  gluconate 50 MG tablet Take 50 mg by mouth  3 (three) times daily.     No current facility-administered medications on file prior to visit.    Allergies  Allergen Reactions   Bee Venom Swelling   Cymbalta [Duloxetine Hcl] Shortness Of Breath    Brought on asthma   Ppd [Tuberculin Purified Protein Derivative] Other (See Comments)    +ppd NEG Quantferron Gold 3/13 (shows false positive)    Calan [Verapamil] Other (See Comments)    Back pain   Tricor [Fenofibrate] Other (See Comments)    Back pain   Claritin  [Loratadine ] Other (See Comments)    Unknown reaction    Levaquin  [Levofloxacin ] Diarrhea   Tuberculin, Ppd Other (See Comments)    +ppd NEG Quantferron Gold 3/13     Assessment/Plan:  1. Hypertension -   Patient's home readings remain above goal of <130/80, mostly  170s/90-100s. However, today's office BP readings are within goal (110/70 and 118/74). I suspect patient's wrist BP monitor is inaccurate, and it has not been validated in office. Will send order for Omron series 3 BP monitor.  Edema has improved significantly with prn dose of furosemide  20 mg. CMP on 12/2023 wnl. Patient reports he will receive labs next week at another appointment. If not confirmed, labs will be ordered at next PharmD visit in 3 weeks.  Continue hydralazine  25mg  TID Continue metoprolol  25mg  TID Continue valsartan  160 mg BID Continue furosemide  20 mg daily; patient may take an additional 20 mg PRN for swelling  Follow up in 3 weeks to: - Reassess BP readings using new Omron BP monitor and validate monitor - Evaluate need for additional agent (CCB) - Review lab results   Tasheka Houseman E. Deniesha Stenglein, Pharm.D [facility]. [facility] [address], [place], [place] [address] Phone: [phone]; Fax: [phone]

## 2024-02-18 NOTE — Patient Instructions (Addendum)
 Changes made by your pharmacist Kaycee Haycraft E. Clement Deneault, PharmD at today's visit:    Instructions/Changes  (what do you need to do) Your Notes  (what you did and when you did it)  Increase furosemide  40 mg daily   2. Continue hydralazine  25mg  three times daily, metoprolol  25mg  three times daily, and valsartan  160 mg  twice daily   3. Pick up new BP cuff      Bring all of your meds, your BP cuff and your record of home blood pressures to your next appointment.    HOW TO TAKE YOUR BLOOD PRESSURE AT HOME  Rest 5 minutes before taking your blood pressure.  Don't smoke or drink caffeinated beverages for at least 30 minutes before. Take your blood pressure before (not after) you eat. Sit comfortably with your back supported and both feet on the floor (don't cross your legs). Elevate your arm to heart level on a table or a desk. Use the proper sized cuff. It should fit smoothly and snugly around your bare upper arm. There should be enough room to slip a fingertip under the cuff. The bottom edge of the cuff should be 1 inch above the crease of the elbow. Ideally, take 3 measurements at one sitting and record the average.  Important lifestyle changes to control high blood pressure  Intervention  Effect on the BP  Lose extra pounds and watch your waistline Weight loss is one of the most effective lifestyle changes for controlling blood pressure. If you're overweight or obese, losing even a small amount of weight can help reduce blood pressure. Blood pressure might go down by about 1 millimeter of mercury (mm Hg) with each kilogram (about 2.2 pounds) of weight lost.  Exercise regularly As a general goal, aim for at least 30 minutes of moderate physical activity every day. Regular physical activity can lower high blood pressure by about 5 to 8 mm Hg.  Eat a healthy diet Eating a diet rich in whole grains, fruits, vegetables, and low-fat dairy products and low in saturated fat and cholesterol. A healthy  diet can lower high blood pressure by up to 11 mm Hg.  Reduce salt (sodium) in your diet Even a small reduction of sodium in the diet can improve heart health and reduce high blood pressure by about 5 to 6 mm Hg.  Limit alcohol One drink equals 12 ounces of beer, 5 ounces of wine, or 1.5 ounces of 80-proof liquor.  Limiting alcohol to less than one drink a day for women or two drinks a day for men can help lower blood pressure by about 4 mm Hg.   If you have any questions or concerns please use My Chart to send questions or call the office at [phone]

## 2024-02-18 NOTE — Progress Notes (Signed)
 Mark Harrell.                                          MRN: [id number]   02/18/2024   The [facility] Quality Team Specialist reviewed this patient medical record for the purposes of chart review for care gap closure. The following were reviewed: chart review for care gap closure-controlling blood pressure.    [facility]BCI Quality Team

## 2024-02-19 ENCOUNTER — Ambulatory Visit: Payer: Self-pay | Admitting: *Deleted

## 2024-02-19 NOTE — Telephone Encounter (Signed)
 Requesting call back today . Recommended UC. Please advise regarding medication and OV Monday       FYI Only or Action Required?: Action required by provider: request for appointment and requesting medication for covid .  Patient was last seen in primary care on 11/02/2023 by de [place], Quintin PARAS, MD.  Called Nurse Triage reporting Covid Positive.  Symptoms began several days ago. 2 days ago   Interventions attempted: OTC medications: coricidin tylenol .  Symptoms are: gradually worsening.  Triage Disposition: Call PCP Now  Patient/caregiver understands and will follow disposition?: No, wishes to speak with PCP                 Copied from CRM #[id number]. Topic: Clinical - Red Word Triage >> Feb 19, 2024  4:19 PM Teressa P wrote: Red Word that prompted transfer to Nurse Triage: e for has COPd and asthma and tested positive for Covid and wants medicaitons for Covid Reason for Disposition  Oxygen  level (e.g., pulse oximetry) 91 to 94%  Answer Assessment - Initial Assessment Questions Recommended UC / telehealth visit . Attempted to contact [facility] and no answer, patient would like PCP to prescribed medication due to positive at home test for covid today sx x 2 days. O2 sat between 91-93% on O2 at 2 L/min. Patient has appt scheduled for Monday 02/22/24 and would like call back regarding to cancel or do VV. Please advise.      1. SYMPTOMS: What is your main symptom or concern? (e.g., cough, fever, shortness of breath, muscle aches)     Cough congestion body aches  2. ONSET: When did the symptoms start?      2 days  3. COUGH: Do you have a cough? If Yes, ask: How bad is the cough?       Yes getting worse  4. FEVER: Do you have a fever? If Yes, ask: What is your temperature, how was it measured, and when did it start?     na 5. BREATHING DIFFICULTY: Are you having any difficulty breathing? (e.g., normal; shortness of breath, wheezing, unable to speak)      Normal   6. BETTER-SAME-WORSE: Are you getting better, staying the same or getting worse compared to yesterday?  If getting worse, ask, In what way?     Getting worse  7. OTHER SYMPTOMS: Do you have any other symptoms?  (e.g., chills, fatigue, headache, loss of smell or taste, muscle pain, sore throat)     Cough , yellow sputum. Body aches 8. COVID-19 DIAGNOSIS: How do you know that you have COVID? (e.g., positive lab test or self-test, diagnosed by doctor or NP/PA, symptoms after exposure).     Yes positive at home test  9. COVID-19 EXPOSURE: Was there any known exposure to COVID before the symptoms began?      Uncle has too 10. COVID-19 VACCINE: Have you had the COVID-19 vaccine? If Yes, ask: When did you last get it? Not in a while  11. HIGH RISK DISEASE: Do you have any chronic medical problems? (e.g., asthma, heart or lung disease, weak immune system, obesity, etc.)       Hx COPD , asthma  12. PREGNANCY: Is there any chance you are pregnant? When was your last menstrual period?       na 13. O2 SATURATION MONITOR:  Do you use an oxygen  saturation monitor (pulse oximeter) at home? If Yes, ask What is your reading (oxygen  level) today? What is your usual oxygen  saturation reading? (e.g.,  95%)       O2 sat at or below 93-91% on oxygen  at 2 L,Min  Protocols used: COVID-19 - Diagnosed or Suspected-A-AH

## 2024-02-21 ENCOUNTER — Other Ambulatory Visit (HOSPITAL_BASED_OUTPATIENT_CLINIC_OR_DEPARTMENT_OTHER): Payer: Self-pay | Admitting: Family Medicine

## 2024-02-21 DIAGNOSIS — S161XXD Strain of muscle, fascia and tendon at neck level, subsequent encounter: Secondary | ICD-10-CM

## 2024-02-22 ENCOUNTER — Encounter (HOSPITAL_BASED_OUTPATIENT_CLINIC_OR_DEPARTMENT_OTHER): Payer: Self-pay | Admitting: *Deleted

## 2024-02-22 ENCOUNTER — Other Ambulatory Visit: Payer: Self-pay

## 2024-02-22 ENCOUNTER — Telehealth (HOSPITAL_BASED_OUTPATIENT_CLINIC_OR_DEPARTMENT_OTHER): Admitting: Family Medicine

## 2024-02-22 ENCOUNTER — Other Ambulatory Visit (HOSPITAL_COMMUNITY): Payer: Self-pay

## 2024-02-22 DIAGNOSIS — U071 COVID-19: Secondary | ICD-10-CM | POA: Insufficient documentation

## 2024-02-22 MED ORDER — NIRMATRELVIR/RITONAVIR (PAXLOVID)TABLET: 3.0000 | ORAL_TABLET | Freq: Two times a day (BID) | ORAL | 0 refills | Status: AC

## 2024-02-22 NOTE — Progress Notes (Signed)
 Specialty Pharmacy Refill Coordination Note  Spoke with Mark Harrell. is a 63 y.o. male contacted today regarding refills of specialty medication(s) Dupilumab  (Dupixent )  Doses on hand: 0  Injection date: 02/26/24   Patient requested: Delivery   Delivery date: 02/24/24   Verified address: [address] [place] [address]  Medication will be filled on 02/23/24.  **Deliver to side door by the garage/cars**

## 2024-02-22 NOTE — Assessment & Plan Note (Signed)
 Symptoms started 02/18/24. Sinus congestion, cough, sore throat, achy. Has been using Coricidin HBP, Delsym, fluids, Tylenol . Did home Covid test - positive 02/19/24 No shortness of breath or trouble breathing. Has received Covid vaccine and 1 booster, none since. Had prior Covid infection in 2020. During virtual visit, patient appears to be in no acute distress.  Speaking in complete sentences, no increased work of breathing.  He does have some slight raspiness to his voice. We discussed considerations, given his past medical history, he is at risk of severe illness from coronavirus.  We discussed this and treatment options.  He would like to proceed with Paxlovid.  Reviewed labs, no current renal impairment, can proceed with standard dose of Paxlovid.  We discussed that this can interact with other medications and did review his medication list and provided recommendations. Discussed additional conservative measures to help with controlling symptoms, can continue with OTC medications.  Can utilize honey on its own or mixed with tea to help with cough.  Discussed nasal sprays. Would expect for gradual improvement in the coming days, did discuss that if symptoms persist beyond 7 to 10 days or if any worsening is noted with sinus congestion, sinus pressure, fevers, recommend reaching back out to office as this can indicate potential superimposed bacterial sinusitis for which antibiotics can be considered. We discussed ER precautions, particularly if any shortness of breath or trouble breathing occurs.

## 2024-02-22 NOTE — Progress Notes (Signed)
   Virtual Visit   I connected with  Mark L Vallas Jr.  on 02/22/24 by telehealth and verified that I am speaking with the correct person using two identifiers. Visit completed via video.   I discussed the limitations, risks, security and privacy concerns of performing an evaluation and management service by telephone, including the higher likelihood of inaccurate diagnosis and treatment, and the availability of in person appointments.  We also discussed the likely need of an additional face to face encounter for complete and high quality delivery of care.  I also discussed with the patient that there may be a patient responsible charge related to this service. The patient expressed understanding and wishes to proceed.  Provider location is in medical facility. Patient location is at their home, different from provider location. People involved in care of the patient during this telehealth encounter were myself, my nurse/medical assistant, and my front office/scheduling team member.  Review of Systems: No fevers, chills, night sweats, weight loss, chest pain, or shortness of breath.   Objective Findings:    General: Speaking full sentences, no audible heavy breathing.  Sounds alert and appropriately interactive.    Independent interpretation of tests performed by another provider:   None.  Brief History, Exam, Impression, and Recommendations:    COVID-19 Symptoms started 02/18/24. Sinus congestion, cough, sore throat, achy. Has been using Coricidin HBP, Delsym, fluids, Tylenol . Did home Covid test - positive 02/19/24 No shortness of breath or trouble breathing. Has received Covid vaccine and 1 booster, none since. Had prior Covid infection in 2020. During virtual visit, patient appears to be in no acute distress.  Speaking in complete sentences, no increased work of breathing.  He does have some slight raspiness to his voice. We discussed considerations, given his past medical history, he  is at risk of severe illness from coronavirus.  We discussed this and treatment options.  He would like to proceed with Paxlovid.  Reviewed labs, no current renal impairment, can proceed with standard dose of Paxlovid.  We discussed that this can interact with other medications and did review his medication list and provided recommendations. Discussed additional conservative measures to help with controlling symptoms, can continue with OTC medications.  Can utilize honey on its own or mixed with tea to help with cough.  Discussed nasal sprays. Would expect for gradual improvement in the coming days, did discuss that if symptoms persist beyond 7 to 10 days or if any worsening is noted with sinus congestion, sinus pressure, fevers, recommend reaching back out to office as this can indicate potential superimposed bacterial sinusitis for which antibiotics can be considered. We discussed ER precautions, particularly if any shortness of breath or trouble breathing occurs.  I discussed the above assessment and treatment plan with the patient. The patient was provided an opportunity to ask questions and all were answered. The patient agreed with the plan and demonstrated an understanding of the instructions.  The patient was advised to call back or seek an in-person evaluation if the symptoms worsen or if the condition fails to improve as anticipated.  I provided 14 minutes of face to face and non-face-to-face time during this encounter date, time was needed to gather information, review chart, records, communicate/coordinate with staff remotely, as well as complete documentation.   ___________________________________________ Sritha Chauncey de [place], MD, ABFM, CAQSM Primary Care and Sports Medicine [facility]

## 2024-02-22 NOTE — Telephone Encounter (Signed)
 LVM for patient to call the office / sent mychart msg

## 2024-02-25 ENCOUNTER — Telehealth: Payer: Self-pay | Admitting: Allergy & Immunology

## 2024-02-25 NOTE — Telephone Encounter (Signed)
 Pt request a call back, he has covid and requesting meds for nebulizer

## 2024-02-26 ENCOUNTER — Other Ambulatory Visit: Payer: Self-pay | Admitting: Family Medicine

## 2024-02-26 MED ORDER — IPRATROPIUM-ALBUTEROL 0.5-2.5 (3) MG/3ML IN SOLN: 3.0000 mL | Freq: Four times a day (QID) | RESPIRATORY_TRACT | 0 refills | Status: DC | PRN

## 2024-02-26 NOTE — Telephone Encounter (Signed)
 I called and left voicemail to inform him that we sent in the duoneb solution and to schedule appointment.

## 2024-02-26 NOTE — Telephone Encounter (Signed)
 Can you please reorder duoneb once every 6 hours if needed for cough or wheeze and have him make a follow up appointment in the next couple weeks. Thank you

## 2024-03-01 ENCOUNTER — Ambulatory Visit: Payer: Self-pay

## 2024-03-01 ENCOUNTER — Telehealth: Payer: Self-pay | Admitting: Adult Health

## 2024-03-01 ENCOUNTER — Encounter (HOSPITAL_COMMUNITY): Payer: Self-pay

## 2024-03-01 ENCOUNTER — Ambulatory Visit (INDEPENDENT_AMBULATORY_CARE_PROVIDER_SITE_OTHER)

## 2024-03-01 ENCOUNTER — Ambulatory Visit (HOSPITAL_COMMUNITY)
Admission: EM | Admit: 2024-03-01 | Discharge: 2024-03-01 | Disposition: A | Discharge status: Home / Self Care | Attending: Physician Assistant | Admitting: Physician Assistant

## 2024-03-01 DIAGNOSIS — J019 Acute sinusitis, unspecified: Secondary | ICD-10-CM | POA: Diagnosis not present

## 2024-03-01 DIAGNOSIS — R051 Acute cough: Secondary | ICD-10-CM

## 2024-03-01 DIAGNOSIS — R059 Cough, unspecified: Secondary | ICD-10-CM | POA: Diagnosis not present

## 2024-03-01 DIAGNOSIS — R0981 Nasal congestion: Secondary | ICD-10-CM | POA: Diagnosis not present

## 2024-03-01 DIAGNOSIS — R531 Weakness: Secondary | ICD-10-CM | POA: Diagnosis not present

## 2024-03-01 DIAGNOSIS — R062 Wheezing: Secondary | ICD-10-CM | POA: Diagnosis not present

## 2024-03-01 DIAGNOSIS — R350 Frequency of micturition: Secondary | ICD-10-CM | POA: Diagnosis not present

## 2024-03-01 MED ORDER — PREDNISONE 20 MG PO TABS: 40.0000 mg | ORAL_TABLET | Freq: Every day | ORAL | 0 refills | Status: AC

## 2024-03-01 MED ORDER — AMOXICILLIN-POT CLAVULANATE 875-125 MG PO TABS: 1.0000 | ORAL_TABLET | Freq: Two times a day (BID) | ORAL | 0 refills | Status: DC

## 2024-03-01 MED ORDER — BENZONATATE 100 MG PO CAPS: 100.0000 mg | ORAL_CAPSULE | Freq: Three times a day (TID) | ORAL | 0 refills | Status: AC

## 2024-03-01 NOTE — Telephone Encounter (Signed)
 Patient is currently arriving at his urologist appointment. No appointments available in PCP office and patient currently just arriving at Urology appointment Patient is advised Urgent Care at this time and he states he will see about going to an Urgent Care near his Urology office when he is done with this appointment.  FYI Only or Action Required?: Action required by provider: update on patient condition.  Patient was last seen in primary care on 02/22/2024 by de [place], Quintin PARAS, MD.  Called Nurse Triage reporting Cough.  Symptoms began about two weeks ago.  Interventions attempted: Prescription medications: Paxlovid, Rest, hydration, or home remedies, and Other: home nebulizer.  Symptoms are: unchanged or worse per patient.  Triage Disposition: See HCP Within 4 Hours (Or PCP Triage)  Patient/caregiver understands and will follow disposition?: Unsure             Copied from CRM [id number]. Topic: Clinical - Red Word Triage >> Mar 01, 2024 12:21 PM Jasmin G wrote: Red Word that prompted transfer to Nurse Triage: Pt states that he recently had a virtual visit with a covid diagnosis and was given med to treat. Pt states that he's still experiencing a constant cough with now dark green mucus that started appearing yesterday. Reason for Disposition . [1] MILD difficulty breathing (e.g., minimal/no SOB at rest, SOB with walking, pulse < 100) AND [2] still present when not coughing  Answer Assessment - Initial Assessment Questions Home COVID test positive on 02/19/2024 Telemedicine visit 02/22/2024 -prescribed Paxlovid Low grade temp Coughing up green Tested his morning and showing negative for COVID now Has had bronchitis and pneumonia in the past Uses his nebulizer treatments Has had the pneumonia vaccine On Oxygen ---2-3 liters Patient does feel like his breathing is worse than normal but doesn't feel like he needs to go to the ER at this time Patient currently arriving at his  urology appointment With no openings at his PCP office, patient states he will go to a nearby Urgent Care when he leaves his urology appointment. Patient is advised that if anything worsens to go to the Emergency Room. Patient verbalized understanding.   1. ONSET: When did the cough begin?      Almost two weeks now 2. SEVERITY: How bad is the cough today?      Persistent---coughing up green mucous 3. SPUTUM: Describe the color of your sputum (e.g., none, dry cough; clear, white, yellow, green)     No 4. HEMOPTYSIS: Are you coughing up any blood? If Yes, ask: How much? (e.g., flecks, streaks, tablespoons, etc.)     No 5. DIFFICULTY BREATHING: Are you having difficulty breathing? If Yes, ask: How bad is it? (e.g., mild, moderate, severe)      Breathing treatments have been helping 6. FEVER: Do you have a fever? If Yes, ask: What is your temperature, how was it measured, and when did it start?     Low grade temps---low 99s 7. CARDIAC HISTORY: Do you have any history of heart disease? (e.g., heart attack, congestive heart failure)      Enlarged heart, diastolic heart failure 8. LUNG HISTORY: Do you have any history of lung disease?  (e.g., pulmonary embolus, asthma, emphysema)     Asthma, COPD 9. PE RISK FACTORS: Do you have a history of blood clots? (or: recent major surgery, recent prolonged travel, bedridden)     Yes---a few years ago 10. OTHER SYMPTOMS: Do you have any other symptoms? (e.g., runny nose, wheezing, chest pain)  Nasal congestion--green phlegm  Protocols used: Cough - Acute Productive-A-AH

## 2024-03-01 NOTE — ED Provider Notes (Signed)
 [facility]    CSN: [id number] Arrival date & time: 03/01/24  1621      History   Chief Complaint Chief Complaint  Patient presents with   Nasal Congestion    HPI Mark Harrell. is a 63 y.o. male.   Patient presents with nasal congestion, cough, wheezing, fatigue that started about 2 weeks ago.  He was diagnosed with COVID about 2 weeks ago and completed Paxlovid.  He reports he is now experiencing increased sinus pressure and congestion.  Patient has a history of COPD and typically wears O2 continuous.  Did not bring O2 to clinic today.  During my interview O2 around 92% on room air.  He has been using his nebulizer with relief.    Past Medical History:  Diagnosis Date   Anesthesia complication requiring reversal agent administration    ? from central apnea, very difficult to get off vent   Anxiety    Arthritis    osteo   Asthma    BPH (benign prostatic hyperplasia)    Complication of anesthesia    difficulty waking , they twlight me because of my respiratory problems    COPD (chronic obstructive pulmonary disease) (HCC)    COVID    moderate case 2023   Depression    Diabetes mellitus (HCC) 08/31/2023   DVT (deep venous thrombosis) (HCC) 2023   right leg   Dyspnea    on exertion   Enlarged heart    Family history of adverse reaction to anesthesia    mother trouble waking up, and heart stopped   GERD (gastroesophageal reflux disease)    Headache    botox  injections for headaches   Hyperlipidemia    Hypertension    Hypogonadism male    IBS (irritable bowel syndrome)    Kidney stones    Memory difficulties    short term memories   Neuropathy    Obesity    On home oxygen  therapy    on 2 liter   OSA (obstructive sleep apnea)    not using cpap   Paralysis (HCC)    left hand - small    due to accident with an head injury   Pneumonia    Pre-diabetes    Prostatitis     Patient Active Problem List   Diagnosis Date Noted   COVID-19 02/22/2024    Acute on chronic diastolic heart failure (HCC) 09/30/2023   Diabetes mellitus (HCC) 08/31/2023   Rash 08/31/2023   Cognitive changes 08/31/2023   Unilateral primary osteoarthritis, left knee 02/24/2022   Unilateral primary osteoarthritis, right knee 02/24/2022   Abnormal CT of the chest 01/28/2022   History of adenomatous polyp of colon 01/08/2022   Hypokalemia 09/26/2021   Dyspnea 09/25/2021   Acute deep vein thrombosis (DVT) of right lower extremity (HCC) 09/24/2021   OSA (obstructive sleep apnea) 09/24/2021   Herniation of cervical intervertebral disc with radiculopathy    Status post cervical discectomy 08/20/2020   Tracheomalacia 05/26/2019   Acquired tracheomalacia 05/26/2019   HNP (herniated nucleus pulposus) with myelopathy, cervical 05/23/2019    Class: Chronic   Spinal stenosis of cervical region 05/23/2019    Class: Chronic   Status post cervical spinal fusion 05/23/2019   Chronic respiratory failure with hypoxia (HCC) 04/12/2018   Bilateral lower extremity edema 12/29/2017   Hymenoptera allergy 11/10/2017   Family history of colonic polyps 08/07/2017   Diverticulosis 08/07/2017   Flatus 08/07/2017   Myofascial pain 08/06/2017   Seasonal and  perennial allergic rhinitis 04/23/2017   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/2017   Cervicalgia 04/01/2017   Recurrent pneumonia 03/09/2017   Other spondylosis with radiculopathy, cervical region 02/09/2017   Depression with anxiety 01/04/2017   Memory difficulty 12/05/2016   Diarrhea in adult patient 12/03/2016   Internal hemorrhoids 12/03/2016   Chronic migraine without aura without status migrainosus, not intractable 10/30/2016   Lumbar radiculopathy 05/15/2016   Osteoarthritis of spine with radiculopathy, lumbar region 05/15/2016   Risk for falls 05/15/2016   Recurrent infections 03/29/2016   Chronic nonseasonal allergic rhinitis due to pollen 03/29/2016   Polypharmacy 01/16/2016   Morbid obesity with BMI of 45.0-49.9, adult  (HCC) 03/06/2015   SDAT 02/05/2015   OSA and COPD overlap syndrome (HCC) 02/05/2015   Medication management 08/02/2014   GERD (gastroesophageal reflux disease) 05/09/2014   Vitamin D  deficiency 08/01/2013   Positive TB test 07/29/2011   Diverticula of colon 05/07/2011   Hypertension 01/31/2011   Hyperlipidemia, mixed 01/31/2011   BPH (benign prostatic hyperplasia) 01/31/2011   Testosterone  Deficiency 01/31/2011   IBS (irritable bowel syndrome) 01/31/2011   Partial complex seizure disorder with intractable epilepsy (HCC) 01/31/2011   Depression, major, recurrent, in partial remission (HCC) 01/31/2011   Asthma-COPD overlap syndrome (HCC) 01/31/2011    Past Surgical History:  Procedure Laterality Date   ABDOMINAL SURGERY     ANKLE FRACTURE SURGERY Right    ANTERIOR CERVICAL DECOMP/DISCECTOMY FUSION N/A 05/23/2019   Procedure: ANTERIOR CERVICAL DISCECTOMY FUSION CERVICAL FIVE THROUGH CERVICAL SIX AND CERVICAL SIX THROUGH CERVICAL SEVEN;  Surgeon: Lucilla Lynwood BRAVO, MD;  Location: [facility] OR;  Service: Orthopedics;  Laterality: N/A;   BRONCHIAL BRUSHINGS  04/28/2022   Procedure: BRONCHIAL BRUSHINGS;  Surgeon: Shelah Lamar RAMAN, MD;  Location: [facility];  Service: Pulmonary;;   BRONCHIAL NEEDLE ASPIRATION BIOPSY  04/28/2022   Procedure: BRONCHIAL NEEDLE ASPIRATION BIOPSIES;  Surgeon: Shelah Lamar RAMAN, MD;  Location: [facility]OSCOPY;  Service: Pulmonary;;   COLONOSCOPY     CYSTOSCOPY     [place]   KNEE ARTHROSCOPY WITH MEDIAL MENISECTOMY Left 01/02/2017   Procedure: LEFT KNEE ARTHROSCOPY WITH PARTIAL MEDIAL MENISCECTOMY;  Surgeon: Vernetta Lonni GRADE, MD;  Location: [facility] ORS;  Service: Orthopedics;  Laterality: Left;   POSTERIOR CERVICAL FUSION/FORAMINOTOMY N/A 08/20/2020   Procedure: LEFT CERVICAL SIX THROUGH SEVEN  AND CERVICAL SEVEN THROUGH THORACIC ONE  FORAMINOTOMIES WITH EXCISION OF DISC HERNIATION LEFT CERVICAL SEVEN THROUGH THORACIC ONE;  Surgeon: Lucilla Lynwood BRAVO, MD;  Location: [facility] OR;   Service: Orthopedics;  Laterality: N/A;   TONSILLECTOMY     TURBINATE RESECTION  2007   UVULOPALATOPHARYNGOPLASTY     VIDEO BRONCHOSCOPY WITH RADIAL ENDOBRONCHIAL ULTRASOUND  04/28/2022   Procedure: VIDEO BRONCHOSCOPY WITH RADIAL ENDOBRONCHIAL ULTRASOUND;  Surgeon: Shelah Lamar RAMAN, MD;  Location: [facility];  Service: Pulmonary;;       Home Medications    Prior to Admission medications   Medication Sig Start Date End Date Taking? Authorizing Provider  amoxicillin -clavulanate (AUGMENTIN ) 875-125 MG tablet Take 1 tablet by mouth every 12 (twelve) hours. 03/01/24  Yes Ward, Harlene PEDLAR, PA-C  acetaminophen  (TYLENOL ) 500 MG tablet Take 1,000 mg by mouth 3 (three) times daily.    [provider]  albuterol  (VENTOLIN  HFA) 108 (90 Base) MCG/ACT inhaler Inhale 1-2 puffs into the lungs every 6 (six) hours as needed for wheezing or shortness of breath. 01/21/24   Iva Marty Saltness, MD  ALPRAZolam  (XANAX ) 0.25 MG tablet Take 1-2 tabs (0.25mg -0.50mg ) 30-60 minutes before procedure. May repeat if needed.Do not  drive. 0/87/74   Ines Onetha NOVAK, MD  anastrozole  (ARIMIDEX ) 1 MG tablet Take 1 tablet (1 mg total) by mouth daily. 12/15/23   Caudle, Thersia Bitters, FNP  ascorbic acid  (VITAMIN C) 1000 MG tablet Take 1,000 mg by mouth every evening.    [provider]  benzonatate  (TESSALON ) 100 MG capsule Take 1 capsule (100 mg total) by mouth every 8 (eight) hours. 03/01/24  Yes Ward, Harlene PEDLAR, PA-C  Blood Pressure Monitoring (OMRON 3 SERIES BP MONITOR) DEVI Use to self monitor blood pressure. 02/18/24   Court Dorn PARAS, MD  botulinum toxin Type A  (BOTOX ) 200 units injection Provider to inject 155 units into the muscles of the head and neck every 12 weeks. Discard remainder. 12/08/23   Millikan, Megan, NP  budesonide  (PULMICORT ) 0.5 MG/2ML nebulizer solution INHALE CONTENTS OF 1 VIAL VIA NEBULIZER 2 (TWO) TIMES DAILY AS NEEDED. 02/11/23   Iva Marty Saltness, MD  Calcium  Carb-Cholecalciferol   (CALCIUM  + VITAMIN D3 PO) Take 1 tablet by mouth daily.    [provider]  celecoxib  (CELEBREX ) 200 MG capsule TAKE 1 CAPSULE (200 MG TOTAL) BY MOUTH EVERY 12 (TWELVE) HOURS. 02/08/24   Vernetta Lonni GRADE, MD  cetaphil (CETAPHIL) lotion Apply 1 Application topically 2 (two) times daily. 04/28/22   Shelah Lamar RAMAN, MD  cetirizine  (ZYRTEC ) 10 MG tablet Take 1 tablet (10 mg total) by mouth daily. 01/21/24   Iva Marty Saltness, MD  Cholecalciferol  (VITAMIN D3) 125 MCG (5000 UT) TABS Take 5,000-10,000 Units by mouth See admin instructions. Take 10,000 in the morning and 5,000 units in the evening    [provider]  Cinnamon  500 MG capsule Take 1,000 mg by mouth 3 (three) times daily.    [provider]  Coenzyme Q10 (CO Q 10 PO) Take 600 mg by mouth daily.    [provider]  cyclobenzaprine  (FLEXERIL ) 10 MG tablet TAKE 1 TABLET 3 TIMES A DAY ONLY IF NEEDED FOR MUSCLE SPASM (NECK) 02/22/24   de [place], Quintin PARAS, MD  Dextromethorphan HBr (DELSYM PO) Take by mouth.    [provider]  diclofenac  Sodium (VOLTAREN ) 1 % GEL Apply 2-4 g topically 2 (two) times daily as needed (pain). 04/28/22   Shelah Lamar RAMAN, MD  DM-APAP-CPM (CORICIDIN HBP PO) Take by mouth.    [provider]  donepezil  (ARICEPT ) 23 MG TABS tablet Take 1 tablet (23 mg total) by mouth daily. 02/12/24   de [place], Quintin PARAS, MD  dupilumab  (DUPIXENT ) 300 MG/2ML prefilled syringe Inject 600 mg into the skin once for 1 dose. THEN 300MG  EVERY 14 DAYS 07/28/23 03/28/24  Iva Marty Saltness, MD  EPINEPHrine  0.3 mg/0.3 mL IJ SOAJ injection Inject 0.3 mg into the muscle as needed for anaphylaxis. 01/21/24   Iva Marty Saltness, MD  furosemide  (LASIX ) 20 MG tablet Take 1 tablet (20 mg total) by mouth daily. Patient may take an additional 20 mg prn swelling (max 40 mg/day) 02/18/24   Court Dorn PARAS, MD  Guaifenesin  1200 MG TB12 Take 1,200 mg by mouth 3 (three) times daily.    [provider]  hydrALAZINE  (APRESOLINE ) 25 MG tablet Take 1 tablet (25 mg total) by mouth 3 (three) times daily. 09/30/23   Court Dorn PARAS, MD  ibuprofen (ADVIL) 800 MG tablet SMARTSIG:1 Tablet(s) By Mouth Every 12 Hours PRN 12/23/23   [provider]  ipratropium (ATROVENT ) 0.03 % nasal spray Place 2 sprays into the nose 3 (three) times daily as needed for rhinitis. 04/14/23  04/13/24  Tobie Arleta SQUIBB, MD  ipratropium-albuterol  (DUONEB) 0.5-2.5 (3) MG/3ML SOLN Take 3 mLs by nebulization every 6 (six) hours as needed. 02/26/24   Ambs, Arlean HERO, FNP  Krill Oil 500 MG CAPS Take 500 mg by mouth 2 (two) times daily.    [provider]  losartan  (COZAAR ) 100 MG tablet Take 100 mg by mouth daily. 12/18/23   [provider]  Magnesium  500 MG CAPS Take 500 mg by mouth 3 (three) times daily.    [provider]  memantine  (NAMENDA ) 10 MG tablet Take 1 tablet (10 mg total) by mouth 2 (two) times daily. 08/31/23   de [place], Raymond J, MD  metFORMIN  (GLUCOPHAGE -XR) 500 MG 24 hr tablet TAKES 2 TABLETS TWICE A DAY WITH MEALS FOR DIABETES 05/29/23   Wilkinson, Dana E, NP  metoprolol  tartrate (LOPRESSOR ) 25 MG tablet TAKE 1 TABLET (25 MG TOTAL) BY MOUTH IN THE MORNING, AT NOON, AND AT BEDTIME. 06/24/23   Cranford, Tonya, NP  Misc Natural Products (GLUCOS-CHONDROIT-MSM COMPLEX) TABS Take 2 tablets by mouth 3 (three) times daily.    [provider]  montelukast  (SINGULAIR ) 10 MG tablet Take 1 tablet (10 mg total) by mouth at bedtime. 08/31/23   de [place], Raymond J, MD  Multiple Vitamins-Minerals (MULTIVITAMIN WITH MINERALS) tablet Take 1 tablet by mouth every morning.    [provider]  Multiple Vitamins-Minerals (PRESERVISION AREDS 2) CAPS Take 1 capsule by mouth 2 (two) times daily. Evening and night    [provider]  Olopatadine  HCl 0.6 % SOLN PLACE 2 SPRAYS INTO THE NOSE 2 (TWO) TIMES DAILY AS NEEDED. 01/13/24   Iva Marty Saltness, MD  OXYGEN  Inhale 2 L into the lungs.     [provider]  oxymetazoline  (AFRIN) 0.05 % nasal spray Place 1 spray into both nostrils at bedtime.    [provider]  pantoprazole  (PROTONIX ) 40 MG tablet Take 1 tablet (40 mg total) by mouth daily. 01/21/24   Iva Marty Saltness, MD  pentosan polysulfate (ELMIRON ) 100 MG capsule Take 100 mg by mouth 3 (three) times daily.    [provider]  potassium chloride  SA (KLOR-CON  M20) 20 MEQ tablet Take 1 tablet (20 mEq total) by mouth daily. 09/30/23   Court Dorn PARAS, MD  pravastatin  (PRAVACHOL ) 40 MG tablet TAKE 1 TABLET BY MOUTH EVERY DAY AT BEDTIME FOR CHOLESTEROL 11/02/23   de [place], Quintin PARAS, MD  predniSONE  (DELTASONE ) 20 MG tablet Take 2 tablets (40 mg total) by mouth daily for 5 days. 03/01/24 03/06/24 Yes Ward, Harlene PEDLAR, PA-C  pregabalin  (LYRICA ) 150 MG capsule TAKE 1 CAPSULE 3 TIMES A DAY FOR CHRONIC PAIN 09/30/23   de [place], Quintin PARAS, MD  sertraline  (ZOLOFT ) 100 MG tablet Take 2 tablets (200 mg total) by mouth daily. 02/12/24   de [place], Quintin PARAS, MD  sodium chloride  (OCEAN) 0.65 % SOLN nasal spray Place 1 spray into both nostrils 4 (four) times daily as needed for congestion. Uses each time before other nasal sprays    [provider]  tadalafil  (CIALIS ) 5 MG tablet Take 5 mg by mouth daily.     [provider]  tamsulosin (FLOMAX) 0.4 MG CAPS capsule Take 0.4 mg by mouth at bedtime. 08/24/23   [provider]  tirzepatide  (MOUNJARO ) 10 MG/0.5ML Pen Inject 10 mg into the skin once a week. 12/24/23   de [place], Raymond J, MD  traZODone  (DESYREL ) 50 MG tablet Take 1 tablet (50 mg total) by mouth  at bedtime. 12/15/23   Caudle, Thersia Bitters, FNP  triamcinolone  ointment (KENALOG ) 0.1 % Apply 1 Application topically 2 (two) times daily. 01/21/24   Iva Marty Saltness, MD  trospium (SANCTURA) 20 MG tablet Take 20 mg by mouth 2 (two) times daily. In the evening and at bedtime    [provider]  TURMERIC PO Take 2,000 mg by mouth 3 (three)  times daily. 1000 mg each    [provider]  valsartan  (DIOVAN ) 160 MG tablet Take 1 tablet (160 mg total) by mouth 2 (two) times daily. 01/05/24   Court Dorn PARAS, MD  zinc  gluconate 50 MG tablet Take 50 mg by mouth 3 (three) times daily.    [provider]    Family History Family History  Problem Relation Age of Onset   Dementia Mother    COPD Mother    Hypertension Mother    Diabetes Mother    Cancer Father        lymphoma, colon   Migraines Sister    Dementia Maternal Aunt    Prostate cancer Maternal Uncle    Migraines Paternal Aunt    Diabetes Paternal Uncle    Dementia Paternal Uncle    Diabetes Maternal Grandmother    Heart disease Maternal Grandfather    Diabetes Maternal Grandfather    Diabetes Paternal Grandmother    Diabetes Paternal Grandfather    Heart disease Paternal Grandfather    Lung disease Neg Hx    Rheumatologic disease Neg Hx     Social History Social History   Tobacco Use   Smoking status: Former    Current packs/day: 0.00    Average packs/day: 0.1 packs/day for 15.0 years (1.5 ttl pk-yrs)    Types: Cigarettes    Start date: 05/26/1968    Quit date: 05/27/1983    Years since quitting: 40.7    Passive exposure: Past   Smokeless tobacco: Never   Tobacco comments:    significant second-hand exposure through mother  Vaping Use   Vaping status: Never Used  Substance Use Topics   Alcohol use: Not Currently    Comment: 2 x a year   Drug use: No     Allergies   Bee venom; Cymbalta [duloxetine hcl]; Ppd [tuberculin purified protein derivative]; Calan [verapamil]; Tricor [fenofibrate]; Claritin  [loratadine ]; Levaquin  [levofloxacin ]; and Tuberculin, ppd   Review of Systems Review of Systems  Constitutional:  Positive for fever. Negative for chills.  HENT:  Positive for congestion, sinus pressure and sinus pain. Negative for ear pain and sore throat.   Eyes:  Negative for pain and visual disturbance.  Respiratory:  Positive  for cough. Negative for shortness of breath.   Cardiovascular:  Negative for chest pain and palpitations.  Gastrointestinal:  Negative for abdominal pain and vomiting.  Genitourinary:  Negative for dysuria and hematuria.  Musculoskeletal:  Negative for arthralgias and back pain.  Skin:  Negative for color change and rash.  Neurological:  Negative for seizures and syncope.  All other systems reviewed and are negative.    Physical Exam Triage Vital Signs ED Triage Vitals  Encounter Vitals Group     BP 03/01/24 1735 (!) 155/90     Girls Systolic BP Percentile --      Girls Diastolic BP Percentile --      Boys Systolic BP Percentile --      Boys Diastolic BP Percentile --      Pulse Rate 03/01/24 1735 87     Resp 03/01/24 1735 19  Temp 03/01/24 1735 98.8 F (37.1 C)     Temp Source 03/01/24 1735 Oral     SpO2 03/01/24 1735 (!) 88 %     Weight --      Height --      Head Circumference --      Peak Flow --      Pain Score 03/01/24 1737 7     Pain Loc --      Pain Education --      Exclude from Growth Chart --    No data found.  Updated Vital Signs BP (!) 155/90 (BP Location: Left Arm)   Pulse 87   Temp 98.8 F (37.1 C) (Oral)   Resp 19   SpO2 (!) 88%   Visual Acuity Right Eye Distance:   Left Eye Distance:   Bilateral Distance:    Right Eye Near:   Left Eye Near:    Bilateral Near:     Physical Exam Vitals and nursing note reviewed.  Constitutional:      General: He is not in acute distress.    Appearance: He is well-developed.  HENT:     Head: Normocephalic and atraumatic.  Eyes:     Conjunctiva/sclera: Conjunctivae normal.  Cardiovascular:     Rate and Rhythm: Normal rate and regular rhythm.     Heart sounds: No murmur heard. Pulmonary:     Effort: Pulmonary effort is normal. No respiratory distress.     Breath sounds: Normal breath sounds.  Abdominal:     Palpations: Abdomen is soft.     Tenderness: There is no abdominal tenderness.   Musculoskeletal:        General: No swelling.     Cervical back: Neck supple.  Skin:    General: Skin is warm and dry.     Capillary Refill: Capillary refill takes less than 2 seconds.  Neurological:     Mental Status: He is alert.  Psychiatric:        Mood and Affect: Mood normal.      UC Treatments / Results  Labs (all labs ordered are listed, but only abnormal results are displayed) Labs Reviewed - No data to display  EKG   Radiology No results found.  Procedures Procedures (including critical care time)  Medications Ordered in UC Medications - No data to display  Initial Impression / Assessment and Plan / UC Course  I have reviewed the triage vital signs and the nursing notes.  Pertinent labs & imaging results that were available during my care of the patient were reviewed by me and considered in my medical decision making (see chart for details).     Will treat COPD exacerbation.  Antibiotic prescribed along with prednisone .  Tessalon  prescribed to take as needed for cough.  Return precautions discussed.  Final Clinical Impressions(s) / UC Diagnoses   Final diagnoses:  Acute cough  Acute non-recurrent sinusitis, unspecified location     Discharge Instructions      Take antibiotic as prescribed. Take prednisone  as prescribed Can take Tessalon  as needed for cough.  Continue with nebulizer as needed. Follow-up with primary care physician if no improvement.     ED Prescriptions     Medication Sig Dispense Auth. Provider   amoxicillin -clavulanate (AUGMENTIN ) 875-125 MG tablet Take 1 tablet by mouth every 12 (twelve) hours. 14 tablet Ward, Lashone Stauber Z, PA-C   predniSONE  (DELTASONE ) 20 MG tablet Take 2 tablets (40 mg total) by mouth daily for 5 days. 10 tablet Ward, Harlene  Z, PA-C   benzonatate  (TESSALON ) 100 MG capsule Take 1 capsule (100 mg total) by mouth every 8 (eight) hours. 21 capsule Ward, Crysten Kaman Z, PA-C      PDMP not reviewed this  encounter.   Ward, Harlene PEDLAR, PA-C 03/01/24 [phone]

## 2024-03-01 NOTE — Telephone Encounter (Signed)
 R/s Botox  back to Megan on 11/10 @ 2:00 pm due to MD departure.

## 2024-03-01 NOTE — Discharge Instructions (Addendum)
 Take antibiotic as prescribed. Take prednisone  as prescribed Can take Tessalon  as needed for cough.  Continue with nebulizer as needed. Follow-up with primary care physician if no improvement.

## 2024-03-01 NOTE — ED Triage Notes (Signed)
 Patient here today with c/o nasal congestion, cough, SOB, wheeze, weakness, and fatigue X 2 weeks. Patient was recently diagnosed with Covid and has taken Paxlovid. Patient states that he has since tested himself for Covid and flu and both has come back negative. Patient has a h/o COPD.

## 2024-03-07 ENCOUNTER — Ambulatory Visit (HOSPITAL_BASED_OUTPATIENT_CLINIC_OR_DEPARTMENT_OTHER): Attending: Orthopaedic Surgery | Admitting: Physical Therapy

## 2024-03-07 ENCOUNTER — Encounter (HOSPITAL_BASED_OUTPATIENT_CLINIC_OR_DEPARTMENT_OTHER): Payer: Self-pay | Admitting: Physical Therapy

## 2024-03-07 DIAGNOSIS — G8929 Other chronic pain: Secondary | ICD-10-CM | POA: Insufficient documentation

## 2024-03-07 DIAGNOSIS — M25561 Pain in right knee: Secondary | ICD-10-CM | POA: Insufficient documentation

## 2024-03-07 DIAGNOSIS — M6281 Muscle weakness (generalized): Secondary | ICD-10-CM | POA: Diagnosis not present

## 2024-03-07 DIAGNOSIS — M25562 Pain in left knee: Secondary | ICD-10-CM | POA: Insufficient documentation

## 2024-03-07 DIAGNOSIS — R2689 Other abnormalities of gait and mobility: Secondary | ICD-10-CM | POA: Insufficient documentation

## 2024-03-07 NOTE — Therapy (Signed)
 OUTPATIENT PHYSICAL THERAPY LOWER EXTREMITY TREATMENT   Patient Name: Mark Harrell. MRN: [id number] DOB:[birth year], 63 y.o., male Today's Date: 03/07/2024  END OF SESSION:  PT End of Session - 03/07/24 1619     Visit Number 2    Date for Recertification  04/29/24    Authorization Type [employer]    PT Start Time 1616    PT Stop Time 1655    PT Time Calculation (min) 39 min    Activity Tolerance Patient tolerated treatment well    Behavior During Therapy WFL for tasks assessed/performed           Past Medical History:  Diagnosis Date   Anesthesia complication requiring reversal agent administration    ? from central apnea, very difficult to get off vent   Anxiety    Arthritis    osteo   Asthma    BPH (benign prostatic hyperplasia)    Complication of anesthesia    difficulty waking , they twlight me because of my respiratory problems    COPD (chronic obstructive pulmonary disease) (HCC)    COVID    moderate case 2023   Depression    Diabetes mellitus (HCC) 08/31/2023   DVT (deep venous thrombosis) (HCC) 2023   right leg   Dyspnea    on exertion   Enlarged heart    Family history of adverse reaction to anesthesia    mother trouble waking up, and heart stopped   GERD (gastroesophageal reflux disease)    Headache    botox  injections for headaches   Hyperlipidemia    Hypertension    Hypogonadism male    IBS (irritable bowel syndrome)    Kidney stones    Memory difficulties    short term memories   Neuropathy    Obesity    On home oxygen  therapy    on 2 liter   OSA (obstructive sleep apnea)    not using cpap   Paralysis (HCC)    left hand - small    due to accident with an head injury   Pneumonia    Pre-diabetes    Prostatitis    Past Surgical History:  Procedure Laterality Date   ABDOMINAL SURGERY     ANKLE FRACTURE SURGERY Right    ANTERIOR CERVICAL DECOMP/DISCECTOMY FUSION N/A 05/23/2019   Procedure: ANTERIOR CERVICAL DISCECTOMY FUSION CERVICAL  FIVE THROUGH CERVICAL SIX AND CERVICAL SIX THROUGH CERVICAL SEVEN;  Surgeon: Lucilla Lynwood BRAVO, MD;  Location: [facility] OR;  Service: Orthopedics;  Laterality: N/A;   BRONCHIAL BRUSHINGS  04/28/2022   Procedure: BRONCHIAL BRUSHINGS;  Surgeon: Shelah Lamar RAMAN, MD;  Location: [facility] ENDOSCOPY;  Service: Pulmonary;;   BRONCHIAL NEEDLE ASPIRATION BIOPSY  04/28/2022   Procedure: BRONCHIAL NEEDLE ASPIRATION BIOPSIES;  Surgeon: Shelah Lamar RAMAN, MD;  Location: [facility] ENDOSCOPY;  Service: Pulmonary;;   COLONOSCOPY     CYSTOSCOPY     [place]   KNEE ARTHROSCOPY WITH MEDIAL MENISECTOMY Left 01/02/2017   Procedure: LEFT KNEE ARTHROSCOPY WITH PARTIAL MEDIAL MENISCECTOMY;  Surgeon: Vernetta Lonni GRADE, MD;  Location: [facility] ORS;  Service: Orthopedics;  Laterality: Left;   POSTERIOR CERVICAL FUSION/FORAMINOTOMY N/A 08/20/2020   Procedure: LEFT CERVICAL SIX THROUGH SEVEN  AND CERVICAL SEVEN THROUGH THORACIC ONE  FORAMINOTOMIES WITH EXCISION OF DISC HERNIATION LEFT CERVICAL SEVEN THROUGH THORACIC ONE;  Surgeon: Lucilla Lynwood BRAVO, MD;  Location: [facility] OR;  Service: Orthopedics;  Laterality: N/A;   TONSILLECTOMY     TURBINATE RESECTION  2007   UVULOPALATOPHARYNGOPLASTY  VIDEO BRONCHOSCOPY WITH RADIAL ENDOBRONCHIAL ULTRASOUND  04/28/2022   Procedure: VIDEO BRONCHOSCOPY WITH RADIAL ENDOBRONCHIAL ULTRASOUND;  Surgeon: Shelah Lamar RAMAN, MD;  Location: [facility] ENDOSCOPY;  Service: Pulmonary;;   Patient Active Problem List   Diagnosis Date Noted   COVID-19 02/22/2024   Acute on chronic diastolic heart failure (HCC) 09/30/2023   Diabetes mellitus (HCC) 08/31/2023   Rash 08/31/2023   Cognitive changes 08/31/2023   Unilateral primary osteoarthritis, left knee 02/24/2022   Unilateral primary osteoarthritis, right knee 02/24/2022   Abnormal CT of the chest 01/28/2022   History of adenomatous polyp of colon 01/08/2022   Hypokalemia 09/26/2021   Dyspnea 09/25/2021   Acute deep vein thrombosis (DVT) of right lower extremity (HCC) 09/24/2021   OSA  (obstructive sleep apnea) 09/24/2021   Herniation of cervical intervertebral disc with radiculopathy    Status post cervical discectomy 08/20/2020   Tracheomalacia 05/26/2019   Acquired tracheomalacia 05/26/2019   HNP (herniated nucleus pulposus) with myelopathy, cervical 05/23/2019    Class: Chronic   Spinal stenosis of cervical region 05/23/2019    Class: Chronic   Status post cervical spinal fusion 05/23/2019   Chronic respiratory failure with hypoxia (HCC) 04/12/2018   Bilateral lower extremity edema 12/29/2017   Hymenoptera allergy 11/10/2017   Family history of colonic polyps 08/07/2017   Diverticulosis 08/07/2017   Flatus 08/07/2017   Myofascial pain 08/06/2017   Seasonal and perennial allergic rhinitis 04/23/2017   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/2017   Cervicalgia 04/01/2017   Recurrent pneumonia 03/09/2017   Other spondylosis with radiculopathy, cervical region 02/09/2017   Depression with anxiety 01/04/2017   Memory difficulty 12/05/2016   Diarrhea in adult patient 12/03/2016   Internal hemorrhoids 12/03/2016   Chronic migraine without aura without status migrainosus, not intractable 10/30/2016   Lumbar radiculopathy 05/15/2016   Osteoarthritis of spine with radiculopathy, lumbar region 05/15/2016   Risk for falls 05/15/2016   Recurrent infections 03/29/2016   Chronic nonseasonal allergic rhinitis due to pollen 03/29/2016   Polypharmacy 01/16/2016   Morbid obesity with BMI of 45.0-49.9, adult (HCC) 03/06/2015   SDAT 02/05/2015   OSA and COPD overlap syndrome (HCC) 02/05/2015   Medication management 08/02/2014   GERD (gastroesophageal reflux disease) 05/09/2014   Vitamin D  deficiency 08/01/2013   Positive TB test 07/29/2011   Diverticula of colon 05/07/2011   Hypertension 01/31/2011   Hyperlipidemia, mixed 01/31/2011   BPH (benign prostatic hyperplasia) 01/31/2011   Testosterone  Deficiency 01/31/2011   IBS (irritable bowel syndrome) 01/31/2011   Partial complex  seizure disorder with intractable epilepsy (HCC) 01/31/2011   Depression, major, recurrent, in partial remission (HCC) 01/31/2011   Asthma-COPD overlap syndrome (HCC) 01/31/2011    PCP: Quintin Crane MD  REFERRING PROVIDER: Vernetta Lonni GRADE, MD   REFERRING DIAG:  [phone] (ICD-10-CM) - Chronic pain of left knee  M25.561,G89.29 (ICD-10-CM) - Chronic pain of right knee    THERAPY DIAG:  Bilateral chronic knee pain  Muscle weakness (generalized)  Other abnormalities of gait and mobility  Rationale for Evaluation and Treatment: Rehabilitation  ONSET DATE: >5 yrs  SUBJECTIVE:   SUBJECTIVE STATEMENT: No changes since eval. R knee pain 6/10  Initial Subjective OA bilateral knees.  Not a surgical candidate at this time due to elevated BMI. Received steroid injections 01/18/24. Twisted my R knee the other day have basically stayed off of it. R knee hurting > left today usually the other way around. TBI 1985 with left sided le weakness; Had a fall in 2019 hurt my neck was paralyzed waist  down resolved on its own, then had a cervical fusion 2020.  Pt reports he does not do a lot of walking as he feels unsafe.   PERTINENT HISTORY: 2020: cervical decompression discectomy/fusion Acute on chronic diastolic heart failure Closed head injury 1985 with residual left side weakness (left leg drags) PAIN:  Are you having pain? Yes: NPRS scale: Right : current 2/10; walking 5/10; worst 8                                                                 Left :current 0/10; walking 1-2/10; worst 3-4/5    Pain location: Knees L>R usually today r>l Pain description: achy; sharp Aggravating factors: walking; standing Relieving factors: sitting; elevation; ice; injections  PRECAUTIONS: None  RED FLAGS: None   WEIGHT BEARING RESTRICTIONS: No  FALLS:  Has patient fallen in last 6 months? 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  DIAGNOSTIC FINDINGS:  L knee x-ray 2023: 2 views of the left knee show tricompartment arthritis mainly involving  the medial compartment of the knee.  This is moderate arthritis.   R knee x-ray 2023 2 views of the right knee show no significant malalignment.  There is mild  tricompartment arthritic changes.   PATIENT SURVEYS:  Lefs: 22/80  COGNITION: Overall cognitive status: Within functional limits for tasks assessed     SENSATION: WFL  EDEMA:  Minimal right knee  MUSCLE LENGTH: Hamstrings: tight bilaterally tested in sitting  POSTURE: rounded shoulders, forward head, and slight knee varus bilaterally  PALPATION: TTP medial joint line left knee  LOWER EXTREMITY ROM:  Active ROM Right eval Left eval  Hip flexion    Hip extension    Hip abduction    Hip adduction    Hip internal rotation    Hip external rotation    Knee flexion 105 130  Knee extension 0 0  Ankle dorsiflexion    Ankle plantarflexion    Ankle inversion    Ankle eversion     (Blank rows = not tested)  LOWER EXTREMITY MMT:  MMT Right eval Left eval  Hip flexion 20.5 32.9  Hip extension    Hip abduction    Hip adduction    Hip internal rotation    Hip external rotation    Knee flexion 4/5 53.0  Knee extension 5/5 33.8  Ankle dorsiflexion 5 5  Ankle plantarflexion    Ankle inversion    Ankle eversion     (Blank rows = not tested)  FUNCTIONAL TESTS:  Timed up and go (TUG): 28.67  4 stage balance: GAIT: Distance walked: 400 ft Assistive device utilized:  Single point cane Level of assistance: Complete Independence Comments: Pt pauses upon standing then initiates gait. Knee flex  TREATMENT  [facility] Adult PT Treatment:                                                DATE: 03/07/24 Pt seen for aquatic therapy today.  Treatment took place in water 3.5-4.75 ft in depth at the [facility] pool. Temp of water was 91.  Pt entered/exited the pool via stairs using step to pattern with hand rail.  *Intro to setting *walking forward, back and side stepping in 3.6 -4.62ft unsupported *unsupported forward marching with kick x 4 widths *Ue support on wall 4.6: toe raises; heel raises; hip add/abd; hip extension; relaxed squats *seated on lift: cycling; hip add/abd; LAQ *3 way hamstring stretch *decompression position with noodle wrapped     across chest  Pt requires the buoyancy and hydrostatic pressure of water for support, and to offload joints by unweighting joint load by at least 50 % in navel deep water and by at least 75-80% in chest to neck deep water.  Viscosity of the water is needed for resistance of strengthening. Water current perturbations provides challenge to standing balance requiring increased core activation.       PATIENT EDUCATION:  Education details: Discussed eval findings, rehab rationale, aquatic program progression/POC and pools in area. Patient is in agreement  Person educated: Patient Education method: Explanation Education comprehension: verbalized understanding  HOME EXERCISE PROGRAM: tba  ASSESSMENT:  CLINICAL IMPRESSION: Pt demonstrates safety and independence in aquatic setting with therapist instructing from deck. He is confident in setting, moving throughout all depths easily.  Pt is directed through various movement patterns and trials in both sitting and standing positions. No ue  support preferred with exercises as he is able to maintain balance with walking well.  Pt is provided VC and demonstration throughout session for execution of exercises and while monitoring toleration. He does report a few sharp pains in right knee when moving it too far  which resolves when movement stops.  He is a good candidate for aquatic intervention and will benefit from the properties of water to progress towards functional goals.    Initial Impression Patient is a 63 y.o. m who was seen today for physical therapy evaluation and treatment for bilateral knee oa. Pt has a long Pmx including cervical spine dysfunction, TBI and heart failure.  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 He presents with pain limited deficits in bilat knees and throughout spine affecting ROM, endurance, activity tolerance, gait, balance, and functional mobility with ADL's. Patient modifying and restricting ADL's as indicated by subjective information, objective measures and outcome measure score.  He will benefit from skilled PT intervention to improve all areas of deficits. Will see initially in aquatics then transition onto land when approp.   OBJECTIVE IMPAIRMENTS: Abnormal gait, decreased activity tolerance, decreased balance, decreased knowledge of use of DME, decreased mobility, difficulty walking, decreased ROM, decreased strength, obesity, and pain.   ACTIVITY LIMITATIONS: carrying, lifting, sitting, squatting, stairs, transfers, and locomotion level  PARTICIPATION LIMITATIONS: meal prep, cleaning, laundry, shopping, community activity, and yard work  PERSONAL FACTORS: Fitness and 3+ comorbidities: see PmHx are also affecting patient's functional outcome.   REHAB POTENTIAL: Good  CLINICAL DECISION MAKING: Stable/uncomplicated  EVALUATION COMPLEXITY: Low   GOALS: Goals reviewed with patient? No  SHORT TERM GOALS: Target date: 03/26/24 Pt  will tolerate full aquatic  sessions consistently without increase in pain and with improving function to demonstrate good toleration and effectiveness of intervention.  Baseline: Goal status: INITIAL  2.  Pt will tolerate stair climbing using alternating or combination of alternating and step to pattern ascending and descending 6 steps with use of handrail  Baseline:  Goal status: INITIAL  3.  Pt will consider gaining pool access for use of the properties of water for chronic conditions maintaining mobility and minimizing pain. Baseline:  Goal status: INITIAL    LONG TERM GOALS: Target date: 04/29/24  Pt to improve on LEFS by at least 9 point to demonstrate statistically significant Improvement in function. Baseline: 22/80 Goal status: INITIAL  2. Pt will tolerate walking to and from setting and engaging in aquatic therapy session without excessive fatigue or increase in pain to demonstrate improved toleration to activity.  Baseline:  Goal status: INITIAL  3.  Pt will improve strength in RLE within 5 lbs of left to demonstrate improved overall physical function Baseline:  Goal status: INITIAL  4.  Pt will improve ROM of  R knee flex to within 5d of left to improve function, decrease risk of injury and pain and improve quality of gait Baseline: See chart Goal status: INITIAL  5.  Pt will be indep with final HEP's (land and aquatic as appropriate) for continued management of condition Baseline:  Goal status: INITIAL     PLAN:  PT FREQUENCY: 1-2x/week  PT DURATION: 10 weeks delayed start due to scheduling needs of pt  PLANNED INTERVENTIONS: [id number]- PT Re-evaluation, 97750- Physical Performance Testing, 97110-Therapeutic exercises, 97530- Therapeutic activity, 97112- Neuromuscular re-education, 97535- Self Care, [id number]- Manual therapy, [id number]- Gait training, [phone]- Aquatic Therapy, [phone]- Ionotophoresis 4mg /ml Dexamethasone , 79439 (1-2 muscles), 20561 (3+ muscles)- Dry Needling, Patient/Family education,  Balance training, Stair training, Taping, Joint mobilization, DME instructions, Cryotherapy, and Moist heat  PLAN FOR NEXT SESSION: Aquatic: general strengthening with focus on knees and core; balance and proprioceptive retraining; stair climbing Land same as [employer].  [address] [place]) Sharona Rovner MPT 03/07/24 4:22 PM [facility] [address] [place], [place], [address] Phone: [phone]   Fax:  [phone]

## 2024-03-08 ENCOUNTER — Encounter: Payer: Self-pay | Admitting: Pharmacist

## 2024-03-08 ENCOUNTER — Ambulatory Visit: Attending: Cardiology | Admitting: Pharmacist

## 2024-03-08 VITALS — BP 138/81 | HR 77

## 2024-03-08 DIAGNOSIS — I10 Essential (primary) hypertension: Secondary | ICD-10-CM

## 2024-03-08 DIAGNOSIS — I5033 Acute on chronic diastolic (congestive) heart failure: Secondary | ICD-10-CM | POA: Diagnosis not present

## 2024-03-08 DIAGNOSIS — R350 Frequency of micturition: Secondary | ICD-10-CM | POA: Diagnosis not present

## 2024-03-08 NOTE — Progress Notes (Signed)
 Patient ID: Mark Harrell.                 DOB: [birth year]                      MRN: [id number]     HPI: Mark Krolak. is a 63 y.o. male referred by Dr. Court to HTN clinic. PMH is significant for HTN, DVT, CHF, COPD, T2DM, and obesity.  Patient presents today for third visit to HTN clinic. Unfortunately was diagnosed with Covid about 10 days ago. Completed course of Paxlovid, antibiotics, and prednisone . Reports blood pressure at home had been variable while sick. Forgot to bring in his cuff today to compare.  Still feels poorly.  Remains on valsartan  160mg  BID, hydralazine  25mg  TID, and metoprolol  25mg  TID with no reported adverse effects.  Due for follow up with Dr Court  Current HTN meds:  Metoprolol  25mg  TID Hydralazine  25mg  TID Valsartan  160mg  twice daily   BP goal: <130/80  Wt Readings from Last 3 Encounters:  02/03/24 (!) 344 lb (156 kg)  01/18/24 (!) 341 lb (154.7 kg)  11/05/23 (!) 350 lb 9.6 oz (159 kg)   BP Readings from Last 3 Encounters:  02/18/24 118/74  02/03/24 (!) 166/99  01/21/24 (!) 152/100   Pulse Readings from Last 3 Encounters:  02/18/24 74  02/03/24 65  01/21/24 85    Renal function: CrCl cannot be calculated (Patient's most recent lab result is older than the maximum 21 days allowed.).  Past Medical History:  Diagnosis Date   Anesthesia complication requiring reversal agent administration    ? from central apnea, very difficult to get off vent   Anxiety    Arthritis    osteo   Asthma    BPH (benign prostatic hyperplasia)    Complication of anesthesia    difficulty waking , they twlight me because of my respiratory problems    COPD (chronic obstructive pulmonary disease) (HCC)    COVID    moderate case 2023   Depression    Diabetes mellitus (HCC) 08/31/2023   DVT (deep venous thrombosis) (HCC) 2023   right leg   Dyspnea    on exertion   Enlarged heart    Family history of adverse reaction to anesthesia    mother trouble waking  up, and heart stopped   GERD (gastroesophageal reflux disease)    Headache    botox  injections for headaches   Hyperlipidemia    Hypertension    Hypogonadism male    IBS (irritable bowel syndrome)    Kidney stones    Memory difficulties    short term memories   Neuropathy    Obesity    On home oxygen  therapy    on 2 liter   OSA (obstructive sleep apnea)    not using cpap   Paralysis (HCC)    left hand - small    due to accident with an head injury   Pneumonia    Pre-diabetes    Prostatitis     Current Outpatient Medications on File Prior to Visit  Medication Sig Dispense Refill   acetaminophen  (TYLENOL ) 500 MG tablet Take 1,000 mg by mouth 3 (three) times daily.     albuterol  (VENTOLIN  HFA) 108 (90 Base) MCG/ACT inhaler Inhale 1-2 puffs into the lungs every 6 (six) hours as needed for wheezing or shortness of breath. 18 each 1   ALPRAZolam  (XANAX ) 0.25 MG tablet Take 1-2 tabs (0.25mg -0.50mg ) 30-60 minutes  before procedure. May repeat if needed.Do not drive. 4 tablet 0   anastrozole  (ARIMIDEX ) 1 MG tablet Take 1 tablet (1 mg total) by mouth daily. 90 tablet 1   ascorbic acid  (VITAMIN C) 1000 MG tablet Take 1,000 mg by mouth every evening.     benzonatate  (TESSALON ) 100 MG capsule Take 200 mg by mouth.     botulinum toxin Type A  (BOTOX ) 200 units injection Provider to inject 155 units into the muscles of the head and neck every 12 weeks. Discard remainder. 1 each 3   budesonide  (PULMICORT ) 0.5 MG/2ML nebulizer solution INHALE CONTENTS OF 1 VIAL VIA NEBULIZER 2 (TWO) TIMES DAILY AS NEEDED. 360 mL 1   budesonide -glycopyrrolate-formoterol  (BREZTRI  AEROSPHERE) 160-9-4.8 MCG/ACT AERO inhaler Inhale 2 puffs into the lungs in the morning and at bedtime. 1 each 5   Calcium  Carb-Cholecalciferol  (CALCIUM  + VITAMIN D3 PO) Take 1 tablet by mouth daily.     celecoxib  (CELEBREX ) 200 MG capsule TAKE 1 CAPSULE (200 MG TOTAL) BY MOUTH EVERY 12 (TWELVE) HOURS. 60 capsule 2   cetaphil (CETAPHIL)  lotion Apply 1 Application topically 2 (two) times daily.     cetirizine  (ZYRTEC ) 10 MG tablet Take 1 tablet (10 mg total) by mouth daily. 90 tablet 1   Cholecalciferol  (VITAMIN D3) 125 MCG (5000 UT) TABS Take 5,000-10,000 Units by mouth See admin instructions. Take 10,000 in the morning and 5,000 units in the evening     Cinnamon  500 MG capsule Take 1,000 mg by mouth 3 (three) times daily.     Coenzyme Q10 (CO Q 10 PO) Take 600 mg by mouth daily.     cyclobenzaprine  (FLEXERIL ) 10 MG tablet TAKE 1 TABLET 3 TIMES A DAY ONLY IF NEEDED FOR MUSCLE SPASM (NECK) 90 tablet 2   Dextromethorphan HBr (DELSYM PO) Take by mouth.     diclofenac  Sodium (VOLTAREN ) 1 % GEL Apply 2-4 g topically 2 (two) times daily as needed (pain).     DM-APAP-CPM (CORICIDIN HBP PO) Take by mouth.     donepezil  (ARICEPT ) 23 MG TABS tablet Take 1 tablet (23 mg total) by mouth daily. 90 tablet 1   dupilumab  (DUPIXENT ) 300 MG/2ML prefilled syringe Inject 600 mg into the skin once for 1 dose. THEN 300MG  EVERY 14 DAYS 4 mL 11   EPINEPHrine  0.3 mg/0.3 mL IJ SOAJ injection Inject 0.3 mg into the muscle as needed for anaphylaxis. 2 each 2   furosemide  (LASIX ) 20 MG tablet Take 40mg  (2 tablets) daily for 7 days, then 20mg  daily. 100 tablet 3   Guaifenesin  1200 MG TB12 Take 1,200 mg by mouth 3 (three) times daily.     hydrALAZINE  (APRESOLINE ) 25 MG tablet Take 1 tablet (25 mg total) by mouth 3 (three) times daily. 270 tablet 3   ibuprofen (ADVIL) 800 MG tablet SMARTSIG:1 Tablet(s) By Mouth Every 12 Hours PRN     ipratropium (ATROVENT ) 0.03 % nasal spray Place 2 sprays into the nose 3 (three) times daily as needed for rhinitis. 90 mL 1   ipratropium-albuterol  (DUONEB) 0.5-2.5 (3) MG/3ML SOLN Take 3 mLs by nebulization 2 (two) times daily as needed. 120 mL 1   Krill Oil 500 MG CAPS Take 500 mg by mouth 2 (two) times daily.     losartan  (COZAAR ) 100 MG tablet Take 100 mg by mouth daily.     Magnesium  500 MG CAPS Take 500 mg by mouth 3  (three) times daily.     memantine  (NAMENDA ) 10 MG tablet Take 1 tablet (10  mg total) by mouth 2 (two) times daily. 270 tablet 3   metFORMIN  (GLUCOPHAGE -XR) 500 MG 24 hr tablet TAKES 2 TABLETS TWICE A DAY WITH MEALS FOR DIABETES 360 tablet 3   metoprolol  tartrate (LOPRESSOR ) 25 MG tablet TAKE 1 TABLET (25 MG TOTAL) BY MOUTH IN THE MORNING, AT NOON, AND AT BEDTIME. 270 tablet 1   Misc Natural Products (GLUCOS-CHONDROIT-MSM COMPLEX) TABS Take 2 tablets by mouth 3 (three) times daily.     montelukast  (SINGULAIR ) 10 MG tablet Take 1 tablet (10 mg total) by mouth at bedtime. 90 tablet 0   Multiple Vitamins-Minerals (MULTIVITAMIN WITH MINERALS) tablet Take 1 tablet by mouth every morning.     Multiple Vitamins-Minerals (PRESERVISION AREDS 2) CAPS Take 1 capsule by mouth 2 (two) times daily. Evening and night     Olopatadine  HCl 0.6 % SOLN PLACE 2 SPRAYS INTO THE NOSE 2 (TWO) TIMES DAILY AS NEEDED. 91.5 g 1   OXYGEN  Inhale 2 L into the lungs.     oxymetazoline  (AFRIN) 0.05 % nasal spray Place 1 spray into both nostrils at bedtime.     pantoprazole  (PROTONIX ) 40 MG tablet Take 1 tablet (40 mg total) by mouth daily. 90 tablet 1   pentosan polysulfate (ELMIRON ) 100 MG capsule Take 100 mg by mouth 3 (three) times daily.     potassium chloride  SA (KLOR-CON  M20) 20 MEQ tablet Take 1 tablet (20 mEq total) by mouth daily. 90 tablet 3   pravastatin  (PRAVACHOL ) 40 MG tablet TAKE 1 TABLET BY MOUTH EVERY DAY AT BEDTIME FOR CHOLESTEROL 90 tablet 3   pregabalin  (LYRICA ) 150 MG capsule TAKE 1 CAPSULE 3 TIMES A DAY FOR CHRONIC PAIN 270 capsule 1   promethazine -dextromethorphan (PROMETHAZINE -DM) 6.25-15 MG/5ML syrup TAKE 5 MLS BY MOUTH 4 TIMES A DAY AS NEEDED FOR COUGH 240 mL 1   sertraline  (ZOLOFT ) 100 MG tablet Take 2 tablets (200 mg total) by mouth daily. 180 tablet 1   sodium chloride  (OCEAN) 0.65 % SOLN nasal spray Place 1 spray into both nostrils 4 (four) times daily as needed for congestion. Uses each time before  other nasal sprays     tadalafil  (CIALIS ) 5 MG tablet Take 5 mg by mouth daily.      tamsulosin (FLOMAX) 0.4 MG CAPS capsule Take 0.4 mg by mouth at bedtime.     tirzepatide  (MOUNJARO ) 10 MG/0.5ML Pen Inject 10 mg into the skin once a week. 2 mL 2   traZODone  (DESYREL ) 50 MG tablet Take 1 tablet (50 mg total) by mouth at bedtime. 90 tablet 1   triamcinolone  ointment (KENALOG ) 0.1 % Apply 1 Application topically 2 (two) times daily. 454 g 0   trospium (SANCTURA) 20 MG tablet Take 20 mg by mouth 2 (two) times daily. In the evening and at bedtime     TURMERIC PO Take 2,000 mg by mouth 3 (three) times daily. 1000 mg each     valsartan  (DIOVAN ) 160 MG tablet Take 1 tablet (160 mg total) by mouth 2 (two) times daily. 60 tablet 5   zinc  gluconate 50 MG tablet Take 50 mg by mouth 3 (three) times daily.     No current facility-administered medications on file prior to visit.    Allergies  Allergen Reactions   Bee Venom Swelling   Cymbalta [Duloxetine Hcl] Shortness Of Breath    Brought on asthma   Ppd [Tuberculin Purified Protein Derivative] Other (See Comments)    +ppd NEG Quantferron Gold 3/13 (shows false positive)    Calan [  Verapamil] Other (See Comments)    Back pain   Tricor [Fenofibrate] Other (See Comments)    Back pain   Claritin  [Loratadine ] Other (See Comments)    Unknown reaction    Levaquin  [Levofloxacin ] Diarrhea   Tuberculin, Ppd Other (See Comments)    +ppd NEG Quantferron Gold 3/13     Assessment/Plan:  1. Hypertension -  Patient BP in room 138/81 which is above goal of <130/80 however patient feels poorly and has recently completed course of prednisone , antibiotic, and antiviral. Will not make any changes at this time. Recommended to continue to monitor at home. Will set patient up for 6 month f/u with Dr Court or APP.  Continue hydralazine  25mg  TID Continue metoprolol  25mg  TID Continue valsartan  160 mg BID Continue furosemide  20 mg daily; patient may take an  additional 20 mg PRN for swelling  Follow up in 3 weeks un:Rympd Chasiti Waddington, PharmD, BCACP, CDCES, CPP [facility] [address] [place], [place] [address] Phone: [phone]; Fax: [phone] 03/08/2024 4:52 PM

## 2024-03-08 NOTE — Patient Instructions (Addendum)
 It was good seeing you again  Sorry you aren't feeling well  Your blood pressure is slightly high today but improved from initial visits  Continue: Valsartan  160mg  twice a day Hydralazine  25mg  three times a day Metoprolol  25mg  three times a day  Continue to check your blood pressure at home  Medford Bolk, PharmD, BCACP, CDCES, CPP [facility] [address] [place], [place] [address] Phone: [phone]; Fax: [phone] 03/08/2024 2:14 PM

## 2024-03-15 ENCOUNTER — Ambulatory Visit (HOSPITAL_BASED_OUTPATIENT_CLINIC_OR_DEPARTMENT_OTHER): Admitting: Physical Therapy

## 2024-03-15 DIAGNOSIS — G8929 Other chronic pain: Secondary | ICD-10-CM

## 2024-03-15 DIAGNOSIS — M6281 Muscle weakness (generalized): Secondary | ICD-10-CM | POA: Diagnosis not present

## 2024-03-15 DIAGNOSIS — R2689 Other abnormalities of gait and mobility: Secondary | ICD-10-CM | POA: Diagnosis not present

## 2024-03-15 DIAGNOSIS — M25561 Pain in right knee: Secondary | ICD-10-CM | POA: Diagnosis not present

## 2024-03-15 DIAGNOSIS — M25562 Pain in left knee: Secondary | ICD-10-CM | POA: Diagnosis not present

## 2024-03-15 NOTE — Therapy (Signed)
 OUTPATIENT PHYSICAL THERAPY LOWER EXTREMITY TREATMENT   Patient Name: Mark Harrell. MRN: [id number] DOB:1961-04-29, 63 y.o., male Today's Date: 03/15/2024  END OF SESSION:  PT End of Session - 03/15/24 1457     Visit Number 3    Date for Recertification  04/29/24    Authorization Type [employer]    PT Start Time 1440    PT Stop Time 1520    PT Time Calculation (min) 40 min    Activity Tolerance Patient tolerated treatment well    Behavior During Therapy WFL for tasks assessed/performed           Past Medical History:  Diagnosis Date   Anesthesia complication requiring reversal agent administration    ? from central apnea, very difficult to get off vent   Anxiety    Arthritis    osteo   Asthma    BPH (benign prostatic hyperplasia)    Complication of anesthesia    difficulty waking , they twlight me because of my respiratory problems    COPD (chronic obstructive pulmonary disease) (HCC)    COVID    moderate case 2023   Depression    Diabetes mellitus (HCC) 08/31/2023   DVT (deep venous thrombosis) (HCC) 2023   right leg   Dyspnea    on exertion   Enlarged heart    Family history of adverse reaction to anesthesia    mother trouble waking up, and heart stopped   GERD (gastroesophageal reflux disease)    Headache    botox  injections for headaches   Hyperlipidemia    Hypertension    Hypogonadism male    IBS (irritable bowel syndrome)    Kidney stones    Memory difficulties    short term memories   Neuropathy    Obesity    On home oxygen  therapy    on 2 liter   OSA (obstructive sleep apnea)    not using cpap   Paralysis (HCC)    left hand - small    due to accident with an head injury   Pneumonia    Pre-diabetes    Prostatitis    Past Surgical History:  Procedure Laterality Date   ABDOMINAL SURGERY     ANKLE FRACTURE SURGERY Right    ANTERIOR CERVICAL DECOMP/DISCECTOMY FUSION N/A 05/23/2019   Procedure: ANTERIOR CERVICAL DISCECTOMY FUSION CERVICAL  FIVE THROUGH CERVICAL SIX AND CERVICAL SIX THROUGH CERVICAL SEVEN;  Surgeon: Lucilla Lynwood BRAVO, MD;  Location: [facility] OR;  Service: Orthopedics;  Laterality: N/A;   BRONCHIAL BRUSHINGS  04/28/2022   Procedure: BRONCHIAL BRUSHINGS;  Surgeon: Shelah Lamar RAMAN, MD;  Location: [facility] ENDOSCOPY;  Service: Pulmonary;;   BRONCHIAL NEEDLE ASPIRATION BIOPSY  04/28/2022   Procedure: BRONCHIAL NEEDLE ASPIRATION BIOPSIES;  Surgeon: Shelah Lamar RAMAN, MD;  Location: [facility] ENDOSCOPY;  Service: Pulmonary;;   COLONOSCOPY     CYSTOSCOPY     [place]baum   KNEE ARTHROSCOPY WITH MEDIAL MENISECTOMY Left 01/02/2017   Procedure: LEFT KNEE ARTHROSCOPY WITH PARTIAL MEDIAL MENISCECTOMY;  Surgeon: Vernetta Lonni GRADE, MD;  Location: [facility] ORS;  Service: Orthopedics;  Laterality: Left;   POSTERIOR CERVICAL FUSION/FORAMINOTOMY N/A 08/20/2020   Procedure: LEFT CERVICAL SIX THROUGH SEVEN  AND CERVICAL SEVEN THROUGH THORACIC ONE  FORAMINOTOMIES WITH EXCISION OF DISC HERNIATION LEFT CERVICAL SEVEN THROUGH THORACIC ONE;  Surgeon: Lucilla Lynwood BRAVO, MD;  Location: [facility] OR;  Service: Orthopedics;  Laterality: N/A;   TONSILLECTOMY     TURBINATE RESECTION  2007   UVULOPALATOPHARYNGOPLASTY  VIDEO BRONCHOSCOPY WITH RADIAL ENDOBRONCHIAL ULTRASOUND  04/28/2022   Procedure: VIDEO BRONCHOSCOPY WITH RADIAL ENDOBRONCHIAL ULTRASOUND;  Surgeon: Shelah Lamar RAMAN, MD;  Location: [facility] ENDOSCOPY;  Service: Pulmonary;;   Patient Active Problem List   Diagnosis Date Noted   COVID-19 02/22/2024   Acute on chronic diastolic heart failure (HCC) 09/30/2023   Diabetes mellitus (HCC) 08/31/2023   Rash 08/31/2023   Cognitive changes 08/31/2023   Unilateral primary osteoarthritis, left knee 02/24/2022   Unilateral primary osteoarthritis, right knee 02/24/2022   Abnormal CT of the chest 01/28/2022   History of adenomatous polyp of colon 01/08/2022   Hypokalemia 09/26/2021   Dyspnea 09/25/2021   Acute deep vein thrombosis (DVT) of right lower extremity (HCC) 09/24/2021   OSA  (obstructive sleep apnea) 09/24/2021   Herniation of cervical intervertebral disc with radiculopathy    Status post cervical discectomy 08/20/2020   Tracheomalacia 05/26/2019   Acquired tracheomalacia 05/26/2019   HNP (herniated nucleus pulposus) with myelopathy, cervical 05/23/2019    Class: Chronic   Spinal stenosis of cervical region 05/23/2019    Class: Chronic   Status post cervical spinal fusion 05/23/2019   Chronic respiratory failure with hypoxia (HCC) 04/12/2018   Bilateral lower extremity edema 12/29/2017   Hymenoptera allergy 11/10/2017   Family history of colonic polyps 08/07/2017   Diverticulosis 08/07/2017   Flatus 08/07/2017   Myofascial pain 08/06/2017   Seasonal and perennial allergic rhinitis 04/23/2017   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/2017   Cervicalgia 04/01/2017   Recurrent pneumonia 03/09/2017   Other spondylosis with radiculopathy, cervical region 02/09/2017   Depression with anxiety 01/04/2017   Memory difficulty 12/05/2016   Diarrhea in adult patient 12/03/2016   Internal hemorrhoids 12/03/2016   Chronic migraine without aura without status migrainosus, not intractable 10/30/2016   Lumbar radiculopathy 05/15/2016   Osteoarthritis of spine with radiculopathy, lumbar region 05/15/2016   Risk for falls 05/15/2016   Recurrent infections 03/29/2016   Chronic nonseasonal allergic rhinitis due to pollen 03/29/2016   Polypharmacy 01/16/2016   Morbid obesity with BMI of 45.0-49.9, adult (HCC) 03/06/2015   SDAT 02/05/2015   OSA and COPD overlap syndrome (HCC) 02/05/2015   Medication management 08/02/2014   GERD (gastroesophageal reflux disease) 05/09/2014   Vitamin D  deficiency 08/01/2013   Positive TB test 07/29/2011   Diverticula of colon 05/07/2011   Hypertension 01/31/2011   Hyperlipidemia, mixed 01/31/2011   BPH (benign prostatic hyperplasia) 01/31/2011   Testosterone  Deficiency 01/31/2011   IBS (irritable bowel syndrome) 01/31/2011   Partial complex  seizure disorder with intractable epilepsy (HCC) 01/31/2011   Depression, major, recurrent, in partial remission (HCC) 01/31/2011   Asthma-COPD overlap syndrome (HCC) 01/31/2011    PCP: Quintin Crane MD  REFERRING PROVIDER: Vernetta Lonni GRADE, MD   REFERRING DIAG:  [phone] (ICD-10-CM) - Chronic pain of left knee  M25.561,G89.29 (ICD-10-CM) - Chronic pain of right knee    THERAPY DIAG:  Bilateral chronic knee pain  Muscle weakness (generalized)  Other abnormalities of gait and mobility  Rationale for Evaluation and Treatment: Rehabilitation  ONSET DATE: >5 yrs  SUBJECTIVE:   SUBJECTIVE STATEMENT: Pt reports some soreness after last session, especially in neck from looking up frequently at therapist.    Initial Subjective OA bilateral knees.  Not a surgical candidate at this time due to elevated BMI. Received steroid injections 01/18/24. Twisted my R knee the other day have basically stayed off of it. R knee hurting > left today usually the other way around. TBI 1985 with left sided le weakness; Had  a fall in 2019 hurt my neck was paralyzed waist down resolved on its own, then had a cervical fusion 2020.  Pt reports he does not do a lot of walking as he feels unsafe.   PERTINENT HISTORY: 2020: cervical decompression discectomy/fusion Acute on chronic diastolic heart failure Closed head injury 1985 with residual left side weakness (left leg drags) PAIN:  Are you having pain? Yes: NPRS scale: Right : 5-6/10    Pain location: knees, neck  Pain description: achy; sharp Aggravating factors: walking; standing Relieving factors: sitting; elevation; ice  PRECAUTIONS: None  RED FLAGS: None   WEIGHT BEARING RESTRICTIONS: No  FALLS:  Has patient fallen in last 6 months? 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  DIAGNOSTIC FINDINGS:  L knee x-ray 2023: 2 views of the left knee show tricompartment arthritis mainly involving  the medial compartment of the knee.  This is moderate arthritis.   R knee x-ray 2023 2 views of the right knee show no significant malalignment.  There is mild  tricompartment arthritic changes.   PATIENT SURVEYS:  Lefs: 22/80  COGNITION: Overall cognitive status: Within functional limits for tasks assessed     SENSATION: WFL  EDEMA:  Minimal right knee  MUSCLE LENGTH: Hamstrings: tight bilaterally tested in sitting  POSTURE: rounded shoulders, forward head, and slight knee varus bilaterally  PALPATION: TTP medial joint line left knee  LOWER EXTREMITY ROM:  Active ROM Right eval Left eval  Hip flexion    Hip extension    Hip abduction    Hip adduction    Hip internal rotation    Hip external rotation    Knee flexion 105 130  Knee extension 0 0  Ankle dorsiflexion    Ankle plantarflexion    Ankle inversion    Ankle eversion     (Blank rows = not tested)  LOWER EXTREMITY MMT:  MMT Right eval Left eval  Hip flexion 20.5 32.9  Hip extension    Hip abduction    Hip adduction    Hip internal rotation    Hip external rotation    Knee flexion 4/5 53.0  Knee extension 5/5 33.8  Ankle dorsiflexion 5 5  Ankle plantarflexion    Ankle inversion    Ankle eversion     (Blank rows = not tested)  FUNCTIONAL TESTS:  Timed up and go (TUG): 28.67  4 stage balance: GAIT: Distance walked: 400 ft Assistive device utilized: Single point cane Level of assistance: Complete Independence Comments: Pt pauses upon standing  then initiates gait. Knee flex  TREATMENT  [facility] Adult PT Treatment:                                                DATE: 03/15/24 Pt seen for aquatic therapy today.  Treatment took place in water 3.5-4.75 ft in depth at the [facility] pool. Temp of water was 91.  Pt entered/exited the pool via stairs using step to pattern with hand rail.  *unsupported: walking forward/ backward and side stepping  *unsupported forward marching  * bow and arrow with step back with cues and demo for form x 6 each side * walking with alternating and same side row with rainbow hand floats  *Ue support on wall 4.6: heel raises x 15; hip add/abdct 2x10; hip extension to toe touch x 10 each * walking forward kicks  *decompression position with white noodle wrapped across chest - cycling -> straddling noodle, cycling (challenge) -> behind back with head resting on noodle, hip abdct/ add, cycling * R/L hamstring stretch at stairs x 15sec each  Pt requires the buoyancy and hydrostatic pressure of water for support, and to offload joints by unweighting joint load by at least 50 % in navel deep water and by at least 75-80% in chest to neck deep water.  Viscosity of the water is needed for resistance of strengthening. Water current perturbations provides challenge to standing balance requiring increased core activation.       PATIENT EDUCATION:  Education details: intro to aquatic therapy   Person educated: Patient Education method: Programmer, multimedia, demo,  Education comprehension: verbalized understanding, return demo  HOME EXERCISE PROGRAM: tba  ASSESSMENT:  CLINICAL IMPRESSION: Pt reported some increase in neck pain during session, when looking up at therapist for next exercise.  Some increased Rt knee pain with step back for bow and arrow exercise, otherwise good tolerance of  all other exercises.  Limited tolerance/ balance for straddling white noodle for cycling.  Pt will benefit from the properties of water to progress towards functional goals.  Goals are ongoing.     Initial Impression Patient is a 63 y.o. m who was seen today for physical therapy evaluation and treatment for bilateral knee oa. Pt has a long Pmx including cervical spine dysfunction, TBI and heart failure.  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 He presents with pain limited deficits in bilat knees and throughout spine affecting ROM, endurance, activity tolerance, gait, balance, and functional mobility with ADL's. Patient modifying and restricting ADL's as indicated by subjective information, objective measures and outcome measure score.  He will benefit from skilled PT intervention to improve all areas of deficits. Will see initially in aquatics then transition onto land when approp.   OBJECTIVE IMPAIRMENTS: Abnormal gait, decreased activity tolerance, decreased balance, decreased knowledge of use of DME, decreased mobility, difficulty walking, decreased ROM, decreased strength, obesity, and pain.   ACTIVITY LIMITATIONS: carrying, lifting, sitting, squatting, stairs, transfers, and locomotion level  PARTICIPATION LIMITATIONS: meal prep, cleaning, laundry, shopping, community activity, and yard work  PERSONAL FACTORS: Fitness and 3+ comorbidities: see PmHx are also affecting patient's functional outcome.   REHAB POTENTIAL: Good  CLINICAL DECISION MAKING: Stable/uncomplicated  EVALUATION COMPLEXITY: Low   GOALS: Goals reviewed with patient? No  SHORT TERM GOALS: Target date: 03/26/24 Pt will tolerate full aquatic sessions consistently without increase in pain  and with improving function to demonstrate good toleration and effectiveness of intervention.  Baseline: Goal status: INITIAL  2.  Pt will tolerate stair climbing using alternating or combination of  alternating and step to pattern ascending and descending 6 steps with use of handrail  Baseline:  Goal status: INITIAL  3.  Pt will consider gaining pool access for use of the properties of water for chronic conditions maintaining mobility and minimizing pain. Baseline:  Goal status: INITIAL    LONG TERM GOALS: Target date: 04/29/24  Pt to improve on LEFS by at least 9 point to demonstrate statistically significant Improvement in function. Baseline: 22/80 Goal status: INITIAL  2. Pt will tolerate walking to and from setting and engaging in aquatic therapy session without excessive fatigue or increase in pain to demonstrate improved toleration to activity.  Baseline:  Goal status: INITIAL  3.  Pt will improve strength in RLE within 5 lbs of left to demonstrate improved overall physical function Baseline:  Goal status: INITIAL  4.  Pt will improve ROM of  R knee flex to within 5d of left to improve function, decrease risk of injury and pain and improve quality of gait Baseline: See chart Goal status: INITIAL  5.  Pt will be indep with final HEP's (land and aquatic as appropriate) for continued management of condition Baseline:  Goal status: INITIAL     PLAN:  PT FREQUENCY: 1-2x/week  PT DURATION: 10 weeks delayed start due to scheduling needs of pt  PLANNED INTERVENTIONS: [id number]- PT Re-evaluation, 97750- Physical Performance Testing, 97110-Therapeutic exercises, 97530- Therapeutic activity, 97112- Neuromuscular re-education, 97535- Self Care, 02859- Manual therapy, [id number]- Gait training, [phone]- Aquatic Therapy, [phone]- Ionotophoresis 4mg /ml Dexamethasone , 79439 (1-2 muscles), 20561 (3+ muscles)- Dry Needling, Patient/Family education, Balance training, Stair training, Taping, Joint mobilization, DME instructions, Cryotherapy, and Moist heat  PLAN FOR NEXT SESSION: Aquatic: general strengthening with focus on knees and core; balance and proprioceptive retraining; stair  climbing Land same as [employer].  Delon Aquas, PTA 03/15/24 3:23 PM [facility] [address] [place], [place], [address] Phone: [phone]   Fax:  [phone]

## 2024-03-16 ENCOUNTER — Other Ambulatory Visit

## 2024-03-16 DIAGNOSIS — Z006 Encounter for examination for normal comparison and control in clinical research program: Secondary | ICD-10-CM

## 2024-03-17 ENCOUNTER — Other Ambulatory Visit: Payer: Self-pay

## 2024-03-17 DIAGNOSIS — J449 Chronic obstructive pulmonary disease, unspecified: Secondary | ICD-10-CM | POA: Diagnosis not present

## 2024-03-17 DIAGNOSIS — J9611 Chronic respiratory failure with hypoxia: Secondary | ICD-10-CM | POA: Diagnosis not present

## 2024-03-17 DIAGNOSIS — R350 Frequency of micturition: Secondary | ICD-10-CM | POA: Diagnosis not present

## 2024-03-17 DIAGNOSIS — I10 Essential (primary) hypertension: Secondary | ICD-10-CM | POA: Diagnosis not present

## 2024-03-18 ENCOUNTER — Other Ambulatory Visit: Payer: Self-pay | Admitting: Internal Medicine

## 2024-03-21 ENCOUNTER — Other Ambulatory Visit: Payer: Self-pay

## 2024-03-21 NOTE — Progress Notes (Signed)
 Specialty Pharmacy Refill Coordination Note  Saron Tweed. is a 63 y.o. male contacted today regarding refills of specialty medication(s) Dupilumab  (Dupixent )   Patient requested Delivery   Delivery date: 03/31/24   Verified address: [address] [place] [address]   Medication will be filled on: 03/30/24

## 2024-03-22 ENCOUNTER — Ambulatory Visit (HOSPITAL_BASED_OUTPATIENT_CLINIC_OR_DEPARTMENT_OTHER): Admitting: Physical Therapy

## 2024-03-22 ENCOUNTER — Encounter (HOSPITAL_BASED_OUTPATIENT_CLINIC_OR_DEPARTMENT_OTHER): Payer: Self-pay | Admitting: Physical Therapy

## 2024-03-22 DIAGNOSIS — M25562 Pain in left knee: Secondary | ICD-10-CM | POA: Diagnosis not present

## 2024-03-22 DIAGNOSIS — R2689 Other abnormalities of gait and mobility: Secondary | ICD-10-CM

## 2024-03-22 DIAGNOSIS — G8929 Other chronic pain: Secondary | ICD-10-CM | POA: Diagnosis not present

## 2024-03-22 DIAGNOSIS — M6281 Muscle weakness (generalized): Secondary | ICD-10-CM

## 2024-03-22 DIAGNOSIS — M25561 Pain in right knee: Secondary | ICD-10-CM | POA: Diagnosis not present

## 2024-03-22 NOTE — Therapy (Signed)
 OUTPATIENT PHYSICAL THERAPY LOWER EXTREMITY TREATMENT   Patient Name: Mark Harrell. MRN: [id number] DOB:[birth year], 63 y.o., male Today's Date: 03/22/2024  END OF SESSION:  PT End of Session - 03/22/24 1504     Visit Number 4    Date for Recertification  04/29/24    Authorization Type BCBS    PT Start Time 1445    PT Stop Time 1525    PT Time Calculation (min) 40 min    Activity Tolerance Patient tolerated treatment well    Behavior During Therapy WFL for tasks assessed/performed           Past Medical History:  Diagnosis Date   Anesthesia complication requiring reversal agent administration    ? from central apnea, very difficult to get off vent   Anxiety    Arthritis    osteo   Asthma    BPH (benign prostatic hyperplasia)    Complication of anesthesia    difficulty waking , they twlight me because of Mark respiratory problems    COPD (chronic obstructive pulmonary disease) (HCC)    COVID    moderate case 2023   Depression    Diabetes mellitus (HCC) 08/31/2023   DVT (deep venous thrombosis) (HCC) 2023   right leg   Dyspnea    on exertion   Enlarged heart    Family history of adverse reaction to anesthesia    mother trouble waking up, and heart stopped   GERD (gastroesophageal reflux disease)    Headache    botox  injections for headaches   Hyperlipidemia    Hypertension    Hypogonadism male    IBS (irritable bowel syndrome)    Kidney stones    Memory difficulties    short term memories   Neuropathy    Obesity    On home oxygen  therapy    on 2 liter   OSA (obstructive sleep apnea)    not using cpap   Paralysis (HCC)    left hand - small    due to accident with an head injury   Pneumonia    Pre-diabetes    Prostatitis    Past Surgical History:  Procedure Laterality Date   ABDOMINAL SURGERY     ANKLE FRACTURE SURGERY Right    ANTERIOR CERVICAL DECOMP/DISCECTOMY FUSION N/A 05/23/2019   Procedure: ANTERIOR CERVICAL DISCECTOMY FUSION CERVICAL  FIVE THROUGH CERVICAL SIX AND CERVICAL SIX THROUGH CERVICAL SEVEN;  Surgeon: Lucilla Lynwood BRAVO, MD;  Location: [facility] OR;  Service: Orthopedics;  Laterality: N/A;   BRONCHIAL BRUSHINGS  04/28/2022   Procedure: BRONCHIAL BRUSHINGS;  Surgeon: Shelah Lamar RAMAN, MD;  Location: [facility] ENDOSCOPY;  Service: Pulmonary;;   BRONCHIAL NEEDLE ASPIRATION BIOPSY  04/28/2022   Procedure: BRONCHIAL NEEDLE ASPIRATION BIOPSIES;  Surgeon: Shelah Lamar RAMAN, MD;  Location: [facility] ENDOSCOPY;  Service: Pulmonary;;   COLONOSCOPY     CYSTOSCOPY     [place]   KNEE ARTHROSCOPY WITH MEDIAL MENISECTOMY Left 01/02/2017   Procedure: LEFT KNEE ARTHROSCOPY WITH PARTIAL MEDIAL MENISCECTOMY;  Surgeon: Vernetta Lonni GRADE, MD;  Location: [facility] ORS;  Service: Orthopedics;  Laterality: Left;   POSTERIOR CERVICAL FUSION/FORAMINOTOMY N/A 08/20/2020   Procedure: LEFT CERVICAL SIX THROUGH SEVEN  AND CERVICAL SEVEN THROUGH THORACIC ONE  FORAMINOTOMIES WITH EXCISION OF DISC HERNIATION LEFT CERVICAL SEVEN THROUGH THORACIC ONE;  Surgeon: Lucilla Lynwood BRAVO, MD;  Location: [facility] OR;  Service: Orthopedics;  Laterality: N/A;   TONSILLECTOMY     TURBINATE RESECTION  2007   UVULOPALATOPHARYNGOPLASTY  VIDEO BRONCHOSCOPY WITH RADIAL ENDOBRONCHIAL ULTRASOUND  04/28/2022   Procedure: VIDEO BRONCHOSCOPY WITH RADIAL ENDOBRONCHIAL ULTRASOUND;  Surgeon: Shelah Lamar RAMAN, MD;  Location: [facility] ENDOSCOPY;  Service: Pulmonary;;   Patient Active Problem List   Diagnosis Date Noted   COVID-19 02/22/2024   Acute on chronic diastolic heart failure (HCC) 09/30/2023   Diabetes mellitus (HCC) 08/31/2023   Rash 08/31/2023   Cognitive changes 08/31/2023   Unilateral primary osteoarthritis, left knee 02/24/2022   Unilateral primary osteoarthritis, right knee 02/24/2022   Abnormal CT of the chest 01/28/2022   History of adenomatous polyp of colon 01/08/2022   Hypokalemia 09/26/2021   Dyspnea 09/25/2021   Acute deep vein thrombosis (DVT) of right lower extremity (HCC) 09/24/2021   OSA  (obstructive sleep apnea) 09/24/2021   Herniation of cervical intervertebral disc with radiculopathy    Status post cervical discectomy 08/20/2020   Tracheomalacia 05/26/2019   Acquired tracheomalacia 05/26/2019   HNP (herniated nucleus pulposus) with myelopathy, cervical 05/23/2019    Class: Chronic   Spinal stenosis of cervical region 05/23/2019    Class: Chronic   Status post cervical spinal fusion 05/23/2019   Chronic respiratory failure with hypoxia (HCC) 04/12/2018   Bilateral lower extremity edema 12/29/2017   Hymenoptera allergy 11/10/2017   Family history of colonic polyps 08/07/2017   Diverticulosis 08/07/2017   Flatus 08/07/2017   Myofascial pain 08/06/2017   Seasonal and perennial allergic rhinitis 04/23/2017   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/2017   Cervicalgia 04/01/2017   Recurrent pneumonia 03/09/2017   Other spondylosis with radiculopathy, cervical region 02/09/2017   Depression with anxiety 01/04/2017   Memory difficulty 12/05/2016   Diarrhea in adult patient 12/03/2016   Internal hemorrhoids 12/03/2016   Chronic migraine without aura without status migrainosus, not intractable 10/30/2016   Lumbar radiculopathy 05/15/2016   Osteoarthritis of spine with radiculopathy, lumbar region 05/15/2016   Risk for falls 05/15/2016   Recurrent infections 03/29/2016   Chronic nonseasonal allergic rhinitis due to pollen 03/29/2016   Polypharmacy 01/16/2016   Morbid obesity with BMI of 45.0-49.9, adult (HCC) 03/06/2015   SDAT 02/05/2015   OSA and COPD overlap syndrome (HCC) 02/05/2015   Medication management 08/02/2014   GERD (gastroesophageal reflux disease) 05/09/2014   Vitamin D  deficiency 08/01/2013   Positive TB test 07/29/2011   Diverticula of colon 05/07/2011   Hypertension 01/31/2011   Hyperlipidemia, mixed 01/31/2011   BPH (benign prostatic hyperplasia) 01/31/2011   Testosterone  Deficiency 01/31/2011   IBS (irritable bowel syndrome) 01/31/2011   Partial complex  seizure disorder with intractable epilepsy (HCC) 01/31/2011   Depression, major, recurrent, in partial remission (HCC) 01/31/2011   Asthma-COPD overlap syndrome (HCC) 01/31/2011    PCP: Quintin Crane MD  REFERRING PROVIDER: Vernetta Lonni GRADE, MD   REFERRING DIAG:  [phone] (ICD-10-CM) - Chronic pain of left knee  M25.561,G89.29 (ICD-10-CM) - Chronic pain of right knee    THERAPY DIAG:  Bilateral chronic knee pain  Muscle weakness (generalized)  Other abnormalities of gait and mobility  Rationale for Evaluation and Treatment: Rehabilitation  ONSET DATE: >5 yrs  SUBJECTIVE:   SUBJECTIVE STATEMENT: Pt reports that he did well after last session.  He reports that pain remains unchanged, but that he feels like he can do more in the water and that it is helping his strength.    Initial Subjective OA bilateral knees.  Not a surgical candidate at this time due to elevated BMI. Received steroid injections 01/18/24. Twisted Mark R knee the other day have basically stayed off of it.  R knee hurting > left today usually the other way around. TBI 1985 with left sided le weakness; Had a fall in 2019 hurt Mark neck was paralyzed waist down resolved on its own, then had a cervical fusion 2020.  Pt reports he does not do a lot of walking as he feels unsafe.   PERTINENT HISTORY: 2020: cervical decompression discectomy/fusion Acute on chronic diastolic heart failure Closed head injury 1985 with residual left side weakness (left leg drags) PAIN:  Are you having pain? Yes: NPRS scale: Right : 5-6/10    Pain location: knees, neck  Pain description: achy; sharp Aggravating factors: walking; standing Relieving factors: sitting; elevation; ice  PRECAUTIONS: None  RED FLAGS: None   WEIGHT BEARING RESTRICTIONS: No  FALLS:  Has patient fallen in last 6 months? 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  DIAGNOSTIC FINDINGS:  L knee x-ray 2023: 2 views of the left knee show tricompartment arthritis mainly involving  the medial compartment of the knee.  This is moderate arthritis.   R knee x-ray 2023 2 views of the right knee show no significant malalignment.  There is mild  tricompartment arthritic changes.   PATIENT SURVEYS:  Lefs: 22/80  COGNITION: Overall cognitive status: Within functional limits for tasks assessed     SENSATION: WFL  EDEMA:  Minimal right knee  MUSCLE LENGTH: Hamstrings: tight bilaterally tested in sitting  POSTURE: rounded shoulders, forward head, and slight knee varus bilaterally  PALPATION: TTP medial joint line left knee  LOWER EXTREMITY ROM:  Active ROM Right eval Left eval  Hip flexion    Hip extension    Hip abduction    Hip adduction    Hip internal rotation    Hip external rotation    Knee flexion 105 130  Knee extension 0 0  Ankle dorsiflexion    Ankle plantarflexion    Ankle inversion    Ankle eversion     (Blank rows = not tested)  LOWER EXTREMITY MMT:  MMT Right eval Left eval  Hip flexion 20.5 32.9  Hip extension    Hip abduction    Hip adduction    Hip internal rotation    Hip external rotation    Knee flexion 4/5 53.0  Knee extension 5/5 33.8  Ankle dorsiflexion 5 5  Ankle plantarflexion    Ankle inversion    Ankle eversion     (Blank rows = not tested)  FUNCTIONAL TESTS:  Timed up and go (TUG): 28.67  4 stage balance: GAIT: Distance walked: 400 ft Assistive device utilized: Single point cane Level of  assistance: Complete Independence Comments: Pt pauses upon standing then initiates gait. Knee flex  TREATMENT  [facility] Adult PT Treatment:                                                DATE: 03/22/24 Pt seen for aquatic therapy today.  Treatment took place in water 3.5-4.75 ft in depth at the [facility] pool. Temp of water was 91.  Pt entered/exited the pool via stairs using step to pattern with hand rail.  * walking forward/ backward with reciprocal row with light resistance bells, 4 laps * side stepping with arm add/abdct with light resistance bells x 3 laps * forward/backward marching with reciprocal row with light resistance bells *UE support on wall 4.6: heel/toe raises x 15; hip add/abdct x10; alternating hamstring curls  x 10 each * TrA set with solid noodle pull down to thighs x 10; in staggered stance x 8 each LE forward (good balance challenge) *decompression position with solid noodle wrapped behind back/straddling thick yellow noodle- cycling  * R/L hamstring stretch at stairs x 15sec each  Pt requires the buoyancy and hydrostatic pressure of water for support, and to offload joints by unweighting joint load by at least 50 % in navel deep water and by at least 75-80% in chest to neck deep water.  Viscosity of the water is needed for resistance of strengthening. Water current perturbations provides challenge to standing balance requiring increased core activation.       PATIENT EDUCATION:  Education details: intro to aquatic therapy   Person educated: Patient Education method: Programmer, Multimedia, demo,  Education comprehension: verbalized understanding, return demo  HOME EXERCISE PROGRAM: tba  ASSESSMENT:  CLINICAL IMPRESSION: Pt reported no change in pain level during session, but good tolerance to exercises performed, including light  resistance bells. He had improved balance while straddling yellow noodle for suspended cycling.    Avoided bow and arrow as this was painful in his Rt knee last session. good tolerance of all other exercises.  Pt will benefit from the properties of water to progress towards functional goals.  Therapist to assess STG next session as time allows.      Initial Impression Patient is a 63 y.o. m who was seen today for physical therapy evaluation and treatment for bilateral knee oa. Pt has a long Pmx including cervical spine dysfunction, TBI and heart failure.  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 He presents with pain limited deficits in bilat knees and throughout spine affecting ROM, endurance, activity tolerance, gait, balance, and functional mobility with ADL's. Patient modifying and restricting ADL's as indicated by subjective information, objective measures and outcome measure score.  He will benefit from skilled PT intervention to improve all areas of deficits. Will see initially in aquatics then transition onto land when approp.   OBJECTIVE IMPAIRMENTS: Abnormal gait, decreased activity tolerance, decreased balance, decreased knowledge of use of DME, decreased mobility, difficulty walking, decreased ROM, decreased strength, obesity, and pain.   ACTIVITY LIMITATIONS: carrying, lifting, sitting, squatting, stairs, transfers, and locomotion level  PARTICIPATION LIMITATIONS: meal prep, cleaning, laundry, shopping, community activity, and yard work  PERSONAL FACTORS: Fitness and 3+ comorbidities: see PmHx are also affecting patient's functional outcome.   REHAB POTENTIAL: Good  CLINICAL DECISION MAKING: Stable/uncomplicated  EVALUATION COMPLEXITY: Low   GOALS: Goals reviewed with patient? No  SHORT TERM GOALS: Target date: 03/26/24 Pt will  tolerate full aquatic sessions consistently without increase in pain and with improving function to demonstrate  good toleration and effectiveness of intervention.  Baseline: Goal status: INITIAL  2.  Pt will tolerate stair climbing using alternating or combination of alternating and step to pattern ascending and descending 6 steps with use of handrail  Baseline:  Goal status: INITIAL  3.  Pt will consider gaining pool access for use of the properties of water for chronic conditions maintaining mobility and minimizing pain. Baseline:  Goal status: INITIAL    LONG TERM GOALS: Target date: 04/29/24  Pt to improve on LEFS by at least 9 point to demonstrate statistically significant Improvement in function. Baseline: 22/80 Goal status: INITIAL  2. Pt will tolerate walking to and from setting and engaging in aquatic therapy session without excessive fatigue or increase in pain to demonstrate improved toleration to activity.  Baseline:  Goal status: INITIAL  3.  Pt will improve strength in RLE within 5 lbs of left to demonstrate improved overall physical function Baseline:  Goal status: INITIAL  4.  Pt will improve ROM of  R knee flex to within 5d of left to improve function, decrease risk of injury and pain and improve quality of gait Baseline: See chart Goal status: INITIAL  5.  Pt will be indep with final HEP's (land and aquatic as appropriate) for continued management of condition Baseline:  Goal status: INITIAL     PLAN:  PT FREQUENCY: 1-2x/week  PT DURATION: 10 weeks delayed start due to scheduling needs of pt  PLANNED INTERVENTIONS: 97164- PT Re-evaluation, 97750- Physical Performance Testing, 97110-Therapeutic exercises, 97530- Therapeutic activity, 97112- Neuromuscular re-education, 97535- Self Care, [id number]- Manual therapy, [id number]- Gait training, [phone]- Aquatic Therapy, [phone]- Ionotophoresis 4mg /ml Dexamethasone , 79439 (1-2 muscles), 20561 (3+ muscles)- Dry Needling, Patient/Family education, Balance training, Stair training, Taping, Joint mobilization, DME instructions,  Cryotherapy, and Moist heat  PLAN FOR NEXT SESSION: Aquatic: general strengthening with focus on knees and core; balance and proprioceptive retraining; stair climbing Land same as [employer].   Delon Aquas, PTA 03/22/24 3:46 PM [facility] [address] [place], [place], [address] Phone: [phone]   Fax:  [phone]

## 2024-03-24 ENCOUNTER — Ambulatory Visit: Admitting: Family Medicine

## 2024-03-24 ENCOUNTER — Other Ambulatory Visit: Payer: Self-pay

## 2024-03-24 VITALS — BP 130/88 | HR 80 | Temp 98.6°F

## 2024-03-24 DIAGNOSIS — B999 Unspecified infectious disease: Secondary | ICD-10-CM

## 2024-03-24 DIAGNOSIS — J302 Other seasonal allergic rhinitis: Secondary | ICD-10-CM

## 2024-03-24 DIAGNOSIS — J3089 Other allergic rhinitis: Secondary | ICD-10-CM

## 2024-03-24 DIAGNOSIS — K219 Gastro-esophageal reflux disease without esophagitis: Secondary | ICD-10-CM | POA: Diagnosis not present

## 2024-03-24 DIAGNOSIS — T63481A Toxic effect of venom of other arthropod, accidental (unintentional), initial encounter: Secondary | ICD-10-CM

## 2024-03-24 DIAGNOSIS — J4489 Other specified chronic obstructive pulmonary disease: Secondary | ICD-10-CM

## 2024-03-24 DIAGNOSIS — R350 Frequency of micturition: Secondary | ICD-10-CM | POA: Diagnosis not present

## 2024-03-24 DIAGNOSIS — T63481D Toxic effect of venom of other arthropod, accidental (unintentional), subsequent encounter: Secondary | ICD-10-CM

## 2024-03-24 NOTE — Patient Instructions (Signed)
 Recurrent infections - with isolated low IgG and a B cell memory defect - Can consider antibiotics three times a week or starting immunoglobulin replacement in the future.  Asthma-COPD overlap syndrome - Daily controller medication(s): Breztri  2 puffs twice a day with a spacer and Singulair  10mg  daily.  - Prior to physical activity: albuterol  2 puffs 10-15 minutes before physical activity - Rescue medications: albuterol  1-2 puffs every 4-6 hours as needed or DuoNeb nebulizer one vial every 4-6 hours as needed - Asthma control goals:  * Full participation in all desired activities (may need albuterol  before activity) * Albuterol  use two time or less a week on average (not counting use with activity) * Cough interfering with sleep two time or less a month * Oral steroids no more than once a year * No hospitalizations Continue Dupixent  300 mg once every 2 weeks for control of asthma symptoms Continue to follow up with Dr. Byrum as recommended CT scan November 2025 for surveillance  Allergic rhinitis (sweet vernal grass, box elder, cat, weeds, ragweed, molds, cockroach, dust mite) - Continue with saline mist 1-2 times daily.  - Continue with Azelastine  2 sprays per nostril up to twice daily.  Aim upward and outward.  - Continue with Ipratropium one spray per nostril every 8 hours as needed for runny noses.  - Continue with Cetirizine  10mg  daily as needed for runny nose, sneezing, itchy watery eyes.  - Continue with Singulair  10 mg in the evening.   GERD Continue pantoprazole  40 mg once a day. Take this medication 30-60 minutes before  meal for best results -Avoid lying down for at least two hours after a meal or after drinking acidic beverages, like soda, or other caffeinated beverages. This can help to prevent stomach contents from flowing back into the esophagus. -Keep your head elevated while you sleep. Using an extra pillow or two can also help to prevent reflux. -Eat smaller and more  frequent meals each day instead of a few large meals. This promotes digestion and can aid in preventing heartburn. -Wear loose-fitting clothes to ease pressure on the stomach, which can worsen heartburn and reflux. -Reduce excess weight around the midsection. This can ease pressure on the stomach. Such pressure can force some stomach contents back up the esophagus.   Stinging insect allergy Continue to avoid stinging insects.  In case of an allergic reaction, take cetirizine  10 mg once every 12-24 hours, and if life-threatening symptoms occur, inject with EpiPen  0.3 mg.  Call the clinic if this treatment plan is not working well for you.  Follow up in 3 months or sooner if needed.  Reducing Pollen Exposure The American Academy of Allergy, Asthma and Immunology suggests the following steps to reduce your exposure to pollen during allergy seasons. Do not hang sheets or clothing out to dry; pollen may collect on these items. Do not mow lawns or spend time around freshly cut grass; mowing stirs up pollen. Keep windows closed at night.  Keep car windows closed while driving. Minimize morning activities outdoors, a time when pollen counts are usually at their highest. Stay indoors as much as possible when pollen counts or humidity is high and on windy days when pollen tends to remain in the air longer. Use air conditioning when possible.  Many air conditioners have filters that trap the pollen spores. Use a HEPA room air filter to remove pollen form the indoor air you breathe.  Control of Mold Allergen Mold and fungi can grow on a variety  of surfaces provided certain temperature and moisture conditions exist.  Outdoor molds grow on plants, decaying vegetation and soil.  The major outdoor mold, Alternaria and Cladosporium, are found in very high numbers during hot and dry conditions.  Generally, a late Summer - Fall peak is seen for common outdoor fungal spores.  Rain will temporarily lower outdoor  mold spore count, but counts rise rapidly when the rainy period ends.  The most important indoor molds are Aspergillus and Penicillium.  Dark, humid and poorly ventilated basements are ideal sites for mold growth.  The next most common sites of mold growth are the bathroom and the kitchen.  Outdoor [employer] Use air conditioning and keep windows closed Avoid exposure to decaying vegetation. Avoid leaf raking. Avoid grain handling. Consider wearing a face mask if working in moldy areas.  Indoor Mold Control Maintain humidity below 50%. Clean washable surfaces with 5% bleach solution. Remove sources e.g. Contaminated carpets.   Control of Dust Mite Allergen Dust mites play a major role in allergic asthma and rhinitis. They occur in environments with high humidity wherever human skin is found. Dust mites absorb humidity from the atmosphere (ie, they do not drink) and feed on organic matter (including shed human and animal skin). Dust mites are a microscopic type of insect that you cannot see with the naked eye. High levels of dust mites have been detected from mattresses, pillows, carpets, upholstered furniture, bed covers, clothes, soft toys and any woven material. The principal allergen of the dust mite is found in its feces. A gram of dust may contain 1,000 mites and 250,000 fecal particles. Mite antigen is easily measured in the air during house cleaning activities. Dust mites do not bite and do not cause harm to humans, other than by triggering allergies/asthma.  Ways to decrease your exposure to dust mites in your home:  1. Encase mattresses, box springs and pillows with a mite-impermeable barrier or cover  2. Wash sheets, blankets and drapes weekly in hot water (130 F) with detergent and dry them in a dryer on the hot setting.  3. Have the room cleaned frequently with a vacuum cleaner and a damp dust-mop. For carpeting or rugs, vacuuming with a vacuum cleaner equipped with a  high-efficiency particulate air (HEPA) filter. The dust mite allergic individual should not be in a room which is being cleaned and should wait 1 hour after cleaning before going into the room.  4. Do not sleep on upholstered furniture (eg, couches).  5. If possible removing carpeting, upholstered furniture and drapery from the home is ideal. Horizontal blinds should be eliminated in the rooms where the person spends the most time (bedroom, study, television room). Washable vinyl, roller-type shades are optimal.  6. Remove all non-washable stuffed toys from the bedroom. Wash stuffed toys weekly like sheets and blankets above.  7. Reduce indoor humidity to less than 50%. Inexpensive humidity monitors can be purchased at most hardware stores. Do not use a humidifier as can make the problem worse and are not recommended.  Control of Cockroach Allergen Cockroach allergen has been identified as an important cause of acute attacks of asthma, especially in urban settings.  There are fifty-five species of cockroach that exist in the [place] , however only three, the American, German and Oriental species produce allergen that can affect patients with Asthma.  Allergens can be obtained from fecal particles, egg casings and secretions from cockroaches.    Remove food sources. Reduce access to water. Seal access  and entry points. Spray runways with 0.5-1% Diazinon or Chlorpyrifos Blow boric acid power under stoves and refrigerator. Place bait stations (hydramethylnon) at feeding sites.

## 2024-03-24 NOTE — Progress Notes (Signed)
 [address] [place] [address] Dept: [phone]  FOLLOW UP NOTE  Patient ID: Mark Harrell Mark Mickey., male    DOB: December 07, 1960  Age: 63 y.o. MRN: [id number] Date of Office Visit: 03/24/2024  Assessment  Chief Complaint: Follow-up (Post covid ), Breathing Problem, Cough, Wheezing, Nasal Congestion, and Headache  HPI Mark Harrell. is a 63 year old male who presents to clinic for follow-up visit.  He was last seen in this clinic on 01/21/2024 by Dr. Iva for evaluation of asthma COPD overlap syndrome, allergic rhinitis, and specific antibody disease, bilateral pulmonary nodules with negative biopsy in 2023, stinging insect allergy, and reflux.  In the interim, he had COVID about 1 month ago for which he did not receive Paxlovid.  Discussed the use of AI scribe software for clinical note transcription with the patient, who gave verbal consent to proceed.  History of Present Illness Mark Harrell. is a 63 year old male with COPD and recent COVID-19 infection who presents with ongoing respiratory symptoms and balance issues.  He recently contracted COVID-19 after exposure from his sister and uncle. He tested positive late on a Friday and was not able to get Paxlovid.   He continues to experience respiratory symptoms including shortness of breath, occasional wheezing, and a productive cough. He uses guaifenesin  due to his COPD to help keep the coughing under control as well as the congestion. He also feels very achy and tired, with burning and achy eyes.  Since recovering from COVID-19, he has been experiencing balance issues. He received  Augmentin  for a sinus infection at an urgent care visit, along with a short course of prednisone .  Asthma is reported as only moderately well-controlled with frequent asthma symptoms including occasional shortness of breath, wheeze, and cough producing mucus.  He uses montelukast  and Breztri  inhaler (two puffs twice a day with a spacer) and albuterol   before exertion. He also uses DuoNeb via nebulizer approximately every other day, which he finds helpful.  He continues Coricidin as needed for cough relief.  He continues Dupixent  injections with no large or local reactions.  He continues to experience a decrease in his asthma symptoms while continuing on Dupixent   He reports allergy symptoms including sneezing, nasal drainage, and dry, red, and irritated eyes. He uses saline nasal spray and cetirizine  (Zyrtec ) for allergies. He does not currently use allergy eye drops.  His last environmental allergy testing was positive to grass pollen, tree pollen, cat, weed, ragweed pollen, mold, dust mite, and cockroach  He has not had any recent insect stings but carries an EpiPen  due to past reactions.  He experiences occasional reflux but is currently on pantoprazole , which helps manage symptoms. He has a history of migraines and receives Botox  treatments, which he finds beneficial. He reports an increase in headaches since COVID-19.  He uses home oxygen  continuously at 2-3 liters per minute and notes a drop in blood oxygenation levels, which he can feel, when he removed the oxygen . He does not have portable oxygen  for the car and finds managing oxygen  tanks challenging. He continues to follow up with Dr. Shelah, pulmonary specialist. He is scheduled for a chest CT for surveillance of pulmonary nodules on 04/13/2024.  He reports a varying pulse rate and can feel his heart rate increase and decrease throughout the day though he does not experience palpitations, diaphoresis, or chest pain.  He continues to follow-up with his cardiology specialist as recommended.  His current medications are listed in the  chart.   Chart review: CT CHEST WITHOUT CONTRAST  08/27/2023 TECHNIQUE: Multidetector CT imaging of the chest was performed using thin slice collimation for electromagnetic bronchoscopy planning purposes, without intravenous contrast.   RADIATION DOSE  REDUCTION: This exam was performed according to the departmental dose-optimization program which includes automated exposure control, adjustment of the mA and/or kV according to patient size and/or use of iterative reconstruction technique.   COMPARISON:  02/13/2023   FINDINGS: Cardiovascular: The heart size is normal. No substantial pericardial effusion. No thoracic aortic aneurysm. No substantial atherosclerosis of the thoracic aorta. Enlargement of the pulmonary outflow tract/main pulmonary arteries suggests pulmonary arterial hypertension.   Mediastinum/Nodes: No mediastinal lymphadenopathy. No evidence for gross hilar lymphadenopathy although assessment is limited by the lack of intravenous contrast on the current study. The esophagus has normal imaging features. There is no axillary lymphadenopathy.   Lungs/Pleura: Right lower lobe paraspinal nodule measured previously at 3.1 x 1.7 cm is 3.0 x 1.7 cm today on image 130/5. Pulmonary nodule in the posterior left costophrenic sulcus measured previously at 1.6 cm is 1.6 cm again today on 133/5.   Clustered tiny nodules deep posterior right costophrenic sulcus on 01/30 8/5 are similar to prior. Similar clustered micro nodularity posterior left lower lobe on 103/5 is not substantially changed. Bandlike linear subpleural nodularity in the paraspinal right lower lobe on image 95/5 is similar.   Other scattered small pulmonary nodules are again noted. No new suspicious pulmonary nodule or mass. No focal airspace consolidation. No pleural effusion.   Upper Abdomen: The liver shows diffusely decreased attenuation suggesting fat deposition. The visualized portion of the upper abdomen is otherwise unremarkable.   Musculoskeletal: No worrisome lytic or sclerotic osseous abnormality.   IMPRESSION: 1. No substantial interval change in the bilateral pulmonary nodules. The largest is in the right lower lobe paraspinal region measuring  3.0 x 1.7 cm. 2. Enlargement of the pulmonary outflow tract/main pulmonary arteries suggests pulmonary arterial hypertension. 3. Hepatic steatosis.     Electronically Signed   By: Camellia Candle M.D.   On: 08/27/2023 08:15  Drug Allergies:  Allergies  Allergen Reactions   Bee Venom Swelling   Cymbalta [Duloxetine Hcl] Shortness Of Breath    Brought on asthma   Ppd [Tuberculin Purified Protein Derivative] Other (See Comments)    +ppd NEG Quantferron Gold 3/13 (shows false positive)    Calan [Verapamil] Other (See Comments)    Back pain   Tricor [Fenofibrate] Other (See Comments)    Back pain   Claritin  [Loratadine ] Other (See Comments)    Unknown reaction    Levaquin  [Levofloxacin ] Diarrhea   Tuberculin, Ppd Other (See Comments)    +ppd NEG Quantferron Gold 3/13    Physical Exam: BP 130/88   Pulse 80   Temp 98.6 F (37 C)   SpO2 93%    Physical Exam Vitals reviewed.  Constitutional:      Appearance: Normal appearance. He is well-developed.  HENT:     Head: Normocephalic and atraumatic.     Right Ear: Tympanic membrane normal.     Left Ear: Tympanic membrane normal.     Nose:     Comments: Bilateral nares edematous and pale with thick clear nasal drainage noted.  Pharynx slightly erythematous with no exudate.  Ears normal.  Eyes with conjunctive a slightly injected.  No drainage from either eye Eyes:     Conjunctiva/sclera: Conjunctivae normal.  Cardiovascular:     Rate and Rhythm: Normal rate and regular rhythm.  Heart sounds: Normal heart sounds. No murmur heard. Pulmonary:     Effort: Pulmonary effort is normal.     Breath sounds: Normal breath sounds.     Comments: Lungs clear to auscultation Musculoskeletal:        General: Normal range of motion.     Cervical back: Normal range of motion and neck supple.  Skin:    General: Skin is warm and dry.  Neurological:     Mental Status: He is alert and oriented to person, place, and time.  Psychiatric:         Mood and Affect: Mood normal.        Behavior: Behavior normal.        Thought Content: Thought content normal.        Judgment: Judgment normal.     Diagnostics: Spirometry deferred.  Will get spirometry at follow-up visit  Assessment and Plan: 1. Asthma-COPD overlap syndrome (HCC)   2. Recurrent infections   3. Seasonal and perennial allergic rhinitis   4. Gastroesophageal reflux disease, unspecified whether esophagitis present   5. Hymenoptera reaction     Meds ordered this encounter  Medications   ipratropium-albuterol  (DUONEB) 0.5-2.5 (3) MG/3ML SOLN    Sig: Take 3 mLs by nebulization every 6 (six) hours as needed.    Dispense:  90 mL    Refill:  0   cromolyn (OPTICROM) 4 % ophthalmic solution    Sig: Place 2 drops into both eyes 4 (four) times daily as needed.    Dispense:  10 mL    Refill:  5    Patient Instructions  Recurrent infections - with isolated low IgG and a B cell memory defect - Can consider antibiotics three times a week or starting immunoglobulin replacement in the future.  Asthma-COPD overlap syndrome - Daily controller medication(s): Breztri  2 puffs twice a day with a spacer and Singulair  10mg  daily.  - Prior to physical activity: albuterol  2 puffs 10-15 minutes before physical activity - Rescue medications: albuterol  1-2 puffs every 4-6 hours as needed or DuoNeb nebulizer one vial every 4-6 hours as needed - Asthma control goals:  * Full participation in all desired activities (may need albuterol  before activity) * Albuterol  use two time or less a week on average (not counting use with activity) * Cough interfering with sleep two time or less a month * Oral steroids no more than once a year * No hospitalizations  Continue to follow up with Dr. Shelah as recommended CT scan November 2025 for surveillance  Allergic rhinitis (sweet vernal grass, box elder, cat, weeds, ragweed, molds, cockroach, dust mite) - Continue with saline mist 1-2 times daily.   - Continue with Azelastine  2 sprays per nostril up to twice daily.  Aim upward and outward.  - Continue with Ipratropium one spray per nostril every 8 hours as needed for runny noses.  - Continue with Cetirizine  10mg  daily as needed for runny nose, sneezing, itchy watery eyes.  - Continue with Singulair  10 mg in the evening.   GERD Continue pantoprazole  40 mg once a day. Take this medication 30-60 minutes before  meal for best results -Avoid lying down for at least two hours after a meal or after drinking acidic beverages, like soda, or other caffeinated beverages. This can help to prevent stomach contents from flowing back into the esophagus. -Keep your head elevated while you sleep. Using an extra pillow or two can also help to prevent reflux. -Eat smaller and more frequent meals  each day instead of a few large meals. This promotes digestion and can aid in preventing heartburn. -Wear loose-fitting clothes to ease pressure on the stomach, which can worsen heartburn and reflux. -Reduce excess weight around the midsection. This can ease pressure on the stomach. Such pressure can force some stomach contents back up the esophagus.   Stinging insect allergy Continue to avoid stinging insects.  In case of an allergic reaction, take cetirizine  10 mg once every 12-24 hours, and if life-threatening symptoms occur, inject with EpiPen  0.3 mg.  Call the clinic if this treatment plan is not working well for you.  Follow up in 3 months or sooner if needed.  Return in about 3 months (around 06/24/2024), or if symptoms worsen or fail to improve.    Thank you for the opportunity to care for this patient.  Please do not hesitate to contact me with questions.  Arlean Mutter, FNP [facility] 

## 2024-03-25 ENCOUNTER — Encounter: Payer: Self-pay | Admitting: Family Medicine

## 2024-03-25 DIAGNOSIS — T63481A Toxic effect of venom of other arthropod, accidental (unintentional), initial encounter: Secondary | ICD-10-CM | POA: Insufficient documentation

## 2024-03-25 LAB — GENECONNECT MOLECULAR SCREEN: Genetic Analysis Overall Interpretation: NEGATIVE

## 2024-03-25 MED ORDER — CROMOLYN SODIUM 4 % OP SOLN: 2.0000 [drp] | Freq: Four times a day (QID) | OPHTHALMIC | 5 refills | Status: AC | PRN

## 2024-03-25 MED ORDER — IPRATROPIUM-ALBUTEROL 0.5-2.5 (3) MG/3ML IN SOLN: 3.0000 mL | Freq: Four times a day (QID) | RESPIRATORY_TRACT | 0 refills | Status: DC | PRN

## 2024-03-28 ENCOUNTER — Encounter: Payer: Self-pay | Admitting: Radiology

## 2024-03-28 ENCOUNTER — Ambulatory Visit: Admitting: Adult Health

## 2024-03-29 ENCOUNTER — Ambulatory Visit: Admitting: Adult Health

## 2024-03-29 DIAGNOSIS — G43709 Chronic migraine without aura, not intractable, without status migrainosus: Secondary | ICD-10-CM | POA: Diagnosis not present

## 2024-03-29 DIAGNOSIS — R350 Frequency of micturition: Secondary | ICD-10-CM | POA: Diagnosis not present

## 2024-03-30 ENCOUNTER — Encounter (HOSPITAL_BASED_OUTPATIENT_CLINIC_OR_DEPARTMENT_OTHER): Payer: Self-pay | Admitting: Physical Therapy

## 2024-03-30 ENCOUNTER — Other Ambulatory Visit: Payer: Self-pay

## 2024-03-30 ENCOUNTER — Ambulatory Visit (HOSPITAL_BASED_OUTPATIENT_CLINIC_OR_DEPARTMENT_OTHER): Attending: Orthopaedic Surgery | Admitting: Physical Therapy

## 2024-03-30 DIAGNOSIS — M25561 Pain in right knee: Secondary | ICD-10-CM | POA: Insufficient documentation

## 2024-03-30 DIAGNOSIS — M25562 Pain in left knee: Secondary | ICD-10-CM | POA: Diagnosis not present

## 2024-03-30 DIAGNOSIS — G8929 Other chronic pain: Secondary | ICD-10-CM | POA: Insufficient documentation

## 2024-03-30 DIAGNOSIS — R2689 Other abnormalities of gait and mobility: Secondary | ICD-10-CM | POA: Diagnosis not present

## 2024-03-30 DIAGNOSIS — M6281 Muscle weakness (generalized): Secondary | ICD-10-CM | POA: Insufficient documentation

## 2024-03-30 NOTE — Therapy (Signed)
 OUTPATIENT PHYSICAL THERAPY LOWER EXTREMITY TREATMENT   Patient Name: Mark Harrell. MRN: [id number] DOB:[birth year], 63 y.o., male Today's Date: 03/30/2024  END OF SESSION:  PT End of Session - 03/30/24 1445     Visit Number 5    Date for Recertification  04/29/24    Authorization Type BCBS    PT Start Time 1445    PT Stop Time 1525    PT Time Calculation (min) 40 min    Activity Tolerance Patient tolerated treatment well    Behavior During Therapy WFL for tasks assessed/performed           Past Medical History:  Diagnosis Date   Anesthesia complication requiring reversal agent administration    ? from central apnea, very difficult to get off vent   Anxiety    Arthritis    osteo   Asthma    BPH (benign prostatic hyperplasia)    Complication of anesthesia    difficulty waking , they twlight me because of my respiratory problems    COPD (chronic obstructive pulmonary disease) (HCC)    COVID    moderate case 2023   Depression    Diabetes mellitus (HCC) 08/31/2023   DVT (deep venous thrombosis) (HCC) 2023   right leg   Dyspnea    on exertion   Enlarged heart    Family history of adverse reaction to anesthesia    mother trouble waking up, and heart stopped   GERD (gastroesophageal reflux disease)    Headache    botox  injections for headaches   Hyperlipidemia    Hypertension    Hypogonadism male    IBS (irritable bowel syndrome)    Kidney stones    Memory difficulties    short term memories   Neuropathy    Obesity    On home oxygen  therapy    on 2 liter   OSA (obstructive sleep apnea)    not using cpap   Paralysis (HCC)    left hand - small    due to accident with an head injury   Pneumonia    Pre-diabetes    Prostatitis    Past Surgical History:  Procedure Laterality Date   ABDOMINAL SURGERY     ANKLE FRACTURE SURGERY Right    ANTERIOR CERVICAL DECOMP/DISCECTOMY FUSION N/A 05/23/2019   Procedure: ANTERIOR CERVICAL DISCECTOMY FUSION CERVICAL FIVE  THROUGH CERVICAL SIX AND CERVICAL SIX THROUGH CERVICAL SEVEN;  Surgeon: Lucilla Lynwood BRAVO, MD;  Location: [facility] OR;  Service: Orthopedics;  Laterality: N/A;   BRONCHIAL BRUSHINGS  04/28/2022   Procedure: BRONCHIAL BRUSHINGS;  Surgeon: Shelah Lamar RAMAN, MD;  Location: [facility] ENDOSCOPY;  Service: Pulmonary;;   BRONCHIAL NEEDLE ASPIRATION BIOPSY  04/28/2022   Procedure: BRONCHIAL NEEDLE ASPIRATION BIOPSIES;  Surgeon: Shelah Lamar RAMAN, MD;  Location: [facility] ENDOSCOPY;  Service: Pulmonary;;   COLONOSCOPY     CYSTOSCOPY     [place]   KNEE ARTHROSCOPY WITH MEDIAL MENISECTOMY Left 01/02/2017   Procedure: LEFT KNEE ARTHROSCOPY WITH PARTIAL MEDIAL MENISCECTOMY;  Surgeon: Vernetta Lonni GRADE, MD;  Location: [facility] ORS;  Service: Orthopedics;  Laterality: Left;   POSTERIOR CERVICAL FUSION/FORAMINOTOMY N/A 08/20/2020   Procedure: LEFT CERVICAL SIX THROUGH SEVEN  AND CERVICAL SEVEN THROUGH THORACIC ONE  FORAMINOTOMIES WITH EXCISION OF DISC HERNIATION LEFT CERVICAL SEVEN THROUGH THORACIC ONE;  Surgeon: Lucilla Lynwood BRAVO, MD;  Location: [facility] OR;  Service: Orthopedics;  Laterality: N/A;   TONSILLECTOMY     TURBINATE RESECTION  2007   UVULOPALATOPHARYNGOPLASTY  VIDEO BRONCHOSCOPY WITH RADIAL ENDOBRONCHIAL ULTRASOUND  04/28/2022   Procedure: VIDEO BRONCHOSCOPY WITH RADIAL ENDOBRONCHIAL ULTRASOUND;  Surgeon: Shelah Lamar RAMAN, MD;  Location: [facility] ENDOSCOPY;  Service: Pulmonary;;   Patient Active Problem List   Diagnosis Date Noted   Hymenoptera reaction 03/25/2024   COVID-19 02/22/2024   Acute on chronic diastolic heart failure (HCC) 09/30/2023   Diabetes mellitus (HCC) 08/31/2023   Rash 08/31/2023   Cognitive changes 08/31/2023   Unilateral primary osteoarthritis, left knee 02/24/2022   Unilateral primary osteoarthritis, right knee 02/24/2022   Abnormal CT of the chest 01/28/2022   History of adenomatous polyp of colon 01/08/2022   Hypokalemia 09/26/2021   Dyspnea 09/25/2021   Acute deep vein thrombosis (DVT) of right lower  extremity (HCC) 09/24/2021   OSA (obstructive sleep apnea) 09/24/2021   Herniation of cervical intervertebral disc with radiculopathy    Status post cervical discectomy 08/20/2020   Tracheomalacia 05/26/2019   Acquired tracheomalacia 05/26/2019   HNP (herniated nucleus pulposus) with myelopathy, cervical 05/23/2019    Class: Chronic   Spinal stenosis of cervical region 05/23/2019    Class: Chronic   Status post cervical spinal fusion 05/23/2019   Chronic respiratory failure with hypoxia (HCC) 04/12/2018   Bilateral lower extremity edema 12/29/2017   Hymenoptera allergy 11/10/2017   Family history of colonic polyps 08/07/2017   Diverticulosis 08/07/2017   Flatus 08/07/2017   Myofascial pain 08/06/2017   Seasonal and perennial allergic rhinitis 04/23/2017   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/2017   Cervicalgia 04/01/2017   Recurrent pneumonia 03/09/2017   Other spondylosis with radiculopathy, cervical region 02/09/2017   Depression with anxiety 01/04/2017   Memory difficulty 12/05/2016   Diarrhea in adult patient 12/03/2016   Internal hemorrhoids 12/03/2016   Chronic migraine without aura without status migrainosus, not intractable 10/30/2016   Lumbar radiculopathy 05/15/2016   Osteoarthritis of spine with radiculopathy, lumbar region 05/15/2016   Risk for falls 05/15/2016   Recurrent infections 03/29/2016   Chronic nonseasonal allergic rhinitis due to pollen 03/29/2016   Polypharmacy 01/16/2016   Morbid obesity with BMI of 45.0-49.9, adult (HCC) 03/06/2015   SDAT 02/05/2015   OSA and COPD overlap syndrome (HCC) 02/05/2015   Medication management 08/02/2014   GERD (gastroesophageal reflux disease) 05/09/2014   Vitamin D  deficiency 08/01/2013   Positive TB test 07/29/2011   Diverticula of colon 05/07/2011   Hypertension 01/31/2011   Hyperlipidemia, mixed 01/31/2011   BPH (benign prostatic hyperplasia) 01/31/2011   Testosterone  Deficiency 01/31/2011   IBS (irritable bowel syndrome)  01/31/2011   Partial complex seizure disorder with intractable epilepsy (HCC) 01/31/2011   Depression, major, recurrent, in partial remission (HCC) 01/31/2011   Asthma-COPD overlap syndrome (HCC) 01/31/2011    PCP: Quintin Crane MD  REFERRING PROVIDER: Vernetta Lonni GRADE, MD   REFERRING DIAG:  [phone] (ICD-10-CM) - Chronic pain of left knee  M25.561,G89.29 (ICD-10-CM) - Chronic pain of right knee    THERAPY DIAG:  Bilateral chronic knee pain  Muscle weakness (generalized)  Other abnormalities of gait and mobility  Rationale for Evaluation and Treatment: Rehabilitation  ONSET DATE: >5 yrs  SUBJECTIVE:   SUBJECTIVE STATEMENT: Pt reports feeling good after last session.  Left knee pain >R 7/10.  Pt reports he is tolerating more activity since beginning aquatic therapy.  Initial Subjective OA bilateral knees.  Not a surgical candidate at this time due to elevated BMI. Received steroid injections 01/18/24. Twisted my R knee the other day have basically stayed off of it. R knee hurting > left today usually  the other way around. TBI 1985 with left sided le weakness; Had a fall in 2019 hurt my neck was paralyzed waist down resolved on its own, then had a cervical fusion 2020.  Pt reports he does not do a lot of walking as he feels unsafe.   PERTINENT HISTORY: 2020: cervical decompression discectomy/fusion Acute on chronic diastolic heart failure Closed head injury 1985 with residual left side weakness (left leg drags) PAIN:  Are you having pain? Yes: NPRS scale: Right : 5-6/10    Pain location: knees, neck  Pain description: achy; sharp Aggravating factors: walking; standing Relieving factors: sitting; elevation; ice  PRECAUTIONS: None  RED FLAGS: None   WEIGHT BEARING RESTRICTIONS: No  FALLS:  Has patient fallen in last 6 months? 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  DIAGNOSTIC FINDINGS:  L knee x-ray 2023: 2 views of the left knee show tricompartment arthritis mainly involving  the medial compartment of the knee.  This is moderate arthritis.   R knee x-ray 2023 2 views of the right knee show no significant malalignment.  There is mild  tricompartment arthritic changes.   PATIENT SURVEYS:  Lefs: 22/80  COGNITION: Overall cognitive status: Within functional limits for tasks assessed     SENSATION: WFL  EDEMA:  Minimal right knee  MUSCLE LENGTH: Hamstrings: tight bilaterally tested in sitting  POSTURE: rounded shoulders, forward head, and slight knee varus bilaterally  PALPATION: TTP medial joint line left knee  LOWER EXTREMITY ROM:  Active ROM Right eval Left eval  Hip flexion    Hip extension    Hip abduction    Hip adduction    Hip internal rotation    Hip external rotation    Knee flexion 105 130  Knee extension 0 0  Ankle dorsiflexion    Ankle plantarflexion    Ankle inversion    Ankle eversion     (Blank rows = not tested)  LOWER EXTREMITY MMT:  MMT Right eval Left eval  Hip flexion 20.5 32.9  Hip extension    Hip abduction    Hip adduction    Hip internal rotation    Hip external rotation    Knee flexion 4/5 53.0  Knee extension 5/5 33.8  Ankle dorsiflexion 5 5  Ankle plantarflexion    Ankle inversion    Ankle eversion     (Blank rows = not tested)  FUNCTIONAL TESTS:  Timed up and go (TUG): 28.67  4 stage balance: GAIT: Distance walked: 400 ft Assistive device utilized: Single point cane Level of  assistance: Complete Independence Comments: Pt pauses upon standing then initiates gait. Knee flex  TREATMENT  [facility] Adult PT Treatment:                                                DATE: 03/30/24 Pt seen for aquatic therapy today.  Treatment took place in water 3.5-4.75 ft in depth at the [facility] pool. Temp of water was 91.  Pt entered/exited the pool via stairs using step to pattern with hand rail.  * walking forward/ backward with reciprocal row with green resistance bells * side stepping with arm add/abdct with green resistance bells * forward/backward marching with reciprocal row with green resistance bells *forward march with kick ue support yellow HB *UE support on white noodle heel/toe raises x 15; hip add/abdct x5; alternating hamstring curls  x 10 each - rest period *supine suspension white noodle under neck: hip add/abd; hip extension *wrapped posteriorly across chest cycling * TrA set with solid noodle pull down to thighs x 10; in staggered stance x 10 each LE forward  *stair negotiation into and out of pool: pt using step to pattern and moderate ue support on hand rail.  VC for execution. * R/L hamstring stretch at stairs x 15sec each x2  Pt requires the buoyancy and hydrostatic pressure of water for support, and to offload joints by unweighting joint load by at least 50 % in navel deep water and by at least 75-80% in chest to neck deep water.  Viscosity of the water is needed for resistance of strengthening. Water current perturbations provides challenge to standing balance requiring increased core activation.       PATIENT EDUCATION:  Education details: intro to aquatic therapy   Person educated: Patient Education method: Programmer, Multimedia, demo,  Education comprehension: verbalized understanding, return demo  HOME EXERCISE  PROGRAM: tba  ASSESSMENT:  CLINICAL IMPRESSION: Pt reports no changes in knee pain.  He does state he feels he is able to tolerate more activity throughout the day. Increased resistance using medium/green resistance bells with good toleration. Increased core engagement and balance reducing ue support with ex.  Progressing well.      Initial Impression Patient is a 63 y.o. m who was seen today for physical therapy evaluation and treatment for bilateral knee oa. Pt has a long Pmx including cervical spine dysfunction, TBI and heart failure.  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 He presents with pain limited deficits in bilat knees and throughout spine affecting ROM, endurance, activity tolerance, gait, balance, and functional mobility with ADL's. Patient modifying and restricting ADL's as indicated by subjective information, objective measures and outcome measure score.  He will benefit from skilled PT intervention to improve all areas of deficits. Will see initially in aquatics then transition onto land when approp.   OBJECTIVE IMPAIRMENTS: Abnormal gait, decreased activity tolerance, decreased balance, decreased knowledge of use of DME, decreased mobility, difficulty walking, decreased ROM, decreased strength, obesity, and pain.   ACTIVITY LIMITATIONS: carrying, lifting, sitting, squatting, stairs, transfers, and locomotion level  PARTICIPATION LIMITATIONS: meal prep, cleaning, laundry, shopping, community activity, and yard work  PERSONAL FACTORS: Fitness and 3+ comorbidities: see PmHx are also affecting patient's functional outcome.   REHAB POTENTIAL: Good  CLINICAL DECISION MAKING: Stable/uncomplicated  EVALUATION COMPLEXITY: Low   GOALS: Goals reviewed with patient? No  SHORT TERM GOALS: Target date: 03/26/24 Pt will tolerate full  aquatic sessions consistently without increase in pain and with improving function to demonstrate good  toleration and effectiveness of intervention.  Baseline: Goal status: Met 03/30/24  2.  Pt will tolerate stair climbing using alternating or combination of alternating and step to pattern ascending and descending 6 steps with use of handrail  Baseline:  Goal status: Met 03/30/24  3.  Pt will consider gaining pool access for use of the properties of water for chronic conditions maintaining mobility and minimizing pain. Baseline:  Goal status: In progress 03/30/24    LONG TERM GOALS: Target date: 04/29/24  Pt to improve on LEFS by at least 9 point to demonstrate statistically significant Improvement in function. Baseline: 22/80 Goal status: INITIAL  2. Pt will tolerate walking to and from setting and engaging in aquatic therapy session without excessive fatigue or increase in pain to demonstrate improved toleration to activity.  Baseline:  Goal status: INITIAL  3.  Pt will improve strength in RLE within 5 lbs of left to demonstrate improved overall physical function Baseline:  Goal status: INITIAL  4.  Pt will improve ROM of  R knee flex to within 5d of left to improve function, decrease risk of injury and pain and improve quality of gait Baseline: See chart Goal status: INITIAL  5.  Pt will be indep with final HEP's (land and aquatic as appropriate) for continued management of condition Baseline:  Goal status: INITIAL     PLAN:  PT FREQUENCY: 1-2x/week  PT DURATION: 10 weeks delayed start due to scheduling needs of pt  PLANNED INTERVENTIONS: 97164- PT Re-evaluation, 97750- Physical Performance Testing, 97110-Therapeutic exercises, 97530- Therapeutic activity, 97112- Neuromuscular re-education, 97535- Self Care, 02859- Manual therapy, [id number]- Gait training, [phone]- Aquatic Therapy, [phone]- Ionotophoresis 4mg /ml Dexamethasone , 79439 (1-2 muscles), 20561 (3+ muscles)- Dry Needling, Patient/Family education, Balance training, Stair training, Taping, Joint mobilization, DME  instructions, Cryotherapy, and Moist heat  PLAN FOR NEXT SESSION: Aquatic: general strengthening with focus on knees and core; balance and proprioceptive retraining; stair climbing Land same as [employer].   Ronal Middleton) Shayanna Thatch MPT 03/30/24 2:47 PM [facility] [address] [place], [place], [address] Phone: [phone]   Fax:  [phone]

## 2024-04-04 ENCOUNTER — Ambulatory Visit: Admitting: Adult Health

## 2024-04-04 ENCOUNTER — Ambulatory Visit: Admitting: Neurology

## 2024-04-04 ENCOUNTER — Encounter: Payer: Self-pay | Admitting: Adult Health

## 2024-04-04 ENCOUNTER — Telehealth: Payer: Self-pay | Admitting: Adult Health

## 2024-04-04 NOTE — Telephone Encounter (Signed)
 Pt called to cancel appt due to being sick appt Canceled

## 2024-04-07 DIAGNOSIS — R350 Frequency of micturition: Secondary | ICD-10-CM | POA: Diagnosis not present

## 2024-04-13 ENCOUNTER — Telehealth: Payer: Self-pay

## 2024-04-13 ENCOUNTER — Ambulatory Visit
Admission: RE | Admit: 2024-04-13 | Discharge: 2024-04-13 | Disposition: A | Source: Ambulatory Visit | Attending: Emergency Medicine | Admitting: Emergency Medicine

## 2024-04-13 DIAGNOSIS — R9389 Abnormal findings on diagnostic imaging of other specified body structures: Secondary | ICD-10-CM

## 2024-04-13 DIAGNOSIS — R918 Other nonspecific abnormal finding of lung field: Secondary | ICD-10-CM | POA: Diagnosis not present

## 2024-04-13 NOTE — Telephone Encounter (Signed)
 Called reading room to get scan escalated for tomorrow appointment.

## 2024-04-14 ENCOUNTER — Other Ambulatory Visit (HOSPITAL_BASED_OUTPATIENT_CLINIC_OR_DEPARTMENT_OTHER): Payer: Self-pay | Admitting: Family Medicine

## 2024-04-14 ENCOUNTER — Ambulatory Visit: Admitting: Emergency Medicine

## 2024-04-14 ENCOUNTER — Encounter: Payer: Self-pay | Admitting: Emergency Medicine

## 2024-04-14 VITALS — BP 130/74 | HR 100 | Temp 97.9°F | Ht 72.0 in | Wt 335.6 lb

## 2024-04-14 DIAGNOSIS — J9611 Chronic respiratory failure with hypoxia: Secondary | ICD-10-CM | POA: Diagnosis not present

## 2024-04-14 DIAGNOSIS — J449 Chronic obstructive pulmonary disease, unspecified: Secondary | ICD-10-CM

## 2024-04-14 DIAGNOSIS — G4733 Obstructive sleep apnea (adult) (pediatric): Secondary | ICD-10-CM

## 2024-04-14 DIAGNOSIS — R918 Other nonspecific abnormal finding of lung field: Secondary | ICD-10-CM | POA: Diagnosis not present

## 2024-04-14 DIAGNOSIS — R350 Frequency of micturition: Secondary | ICD-10-CM | POA: Diagnosis not present

## 2024-04-14 NOTE — Patient Instructions (Addendum)
 We reviewed your CT scan of the chest today.  This is stable compared with priors.  Good news. We will repeat your CT chest in 1 year, November 2026. We reviewed your sleep study today.  We will order CPAP for you. Continue your oxygen  at 2-3 L/min Continue your Trelegy as you have been taking it rinse and gargle after using. Continue your Dupixent  every other week Keep albuterol  available to use 2 puffs if needed for shortness of breath, chest tightness, wheezing. Follow-up in our office with Dr. Shelah or APP in about 2 months so we can assess your status on the new CPAP.

## 2024-04-14 NOTE — Assessment & Plan Note (Signed)
 Documented good compliance with his oxygen .  Need to continue 2-3 L/min depending on level exertion

## 2024-04-14 NOTE — Progress Notes (Signed)
 Subjective:    Patient ID: Mark LITTIE [name]., male    DOB: [birth year], 63 y.o.   MRN: [id number]  HPI  ROV 04/14/2024 --Mark Harrell is 63 with a minimal tobacco history, asthmatic COPD, OSA not on CPAP, DVT.  He also has pulmonary nodular disease that we have followed on serial imaging post bronchoscopy 04/28/2022 that was cytology negative.  He has had a decrease in size of his nodules since then.  He managed on Trelegy, Dupixent , albuterol  if needed.  He is on oxygen  at 2-3 L/min. He had COVID again in 01/2024, has improved, close to baseline but may not be fully recovered. Some cough, sputum production  Hoem sleep test 11/23/23 >> severe OSA, 57.2/hr.   CT scan of the chest performed 04/13/2024 and reviewed by me shows stable appearance of his multiple pulmonary nodules predominantly perilymphatic distribution in the bilateral lower lobes the predominant posterior medial right lower lobe nodule 3.4 x 1.8 cm is stable in size and appearance.  No new nodules   Review of Systems As per HPI  Past Medical History:  Diagnosis Date   Anesthesia complication requiring reversal agent administration    ? from central apnea, very difficult to get off vent   Anxiety    Arthritis    osteo   Asthma    BPH (benign prostatic hyperplasia)    Complication of anesthesia    difficulty waking , they twlight me because of my respiratory problems    COPD (chronic obstructive pulmonary disease) (HCC)    COVID    moderate case 2023   Depression    Diabetes mellitus (HCC) 08/31/2023   DVT (deep venous thrombosis) (HCC) 2023   right leg   Dyspnea    on exertion   Enlarged heart    Family history of adverse reaction to anesthesia    mother trouble waking up, and heart stopped   GERD (gastroesophageal reflux disease)    Headache    botox  injections for headaches   Hyperlipidemia    Hypertension    Hypogonadism male    IBS (irritable bowel syndrome)    Kidney stones    Memory difficulties    short term  memories   Neuropathy    Obesity    On home oxygen  therapy    on 2 liter   OSA (obstructive sleep apnea)    not using cpap   Paralysis (HCC)    left hand - small    due to accident with an head injury   Pneumonia    Pre-diabetes    Prostatitis            Objective:   Physical Exam  Vitals:   04/14/24 1548  BP: 130/74  Pulse: 100  Temp: 97.9 F (36.6 C)  SpO2: 92%  Weight: (!) 335 lb 9.6 oz (152.2 kg)  Height: 6' (1.829 m)    Gen: Pleasant, obese, in no distress,  normal affect  ENT: No lesions,  mouth clear,  oropharynx clear, no postnasal drip  Neck: No JVD, no stridor  Lungs: No use of accessory muscles, no crackles or wheezing on normal respiration, no wheeze on forced expiration  Cardiovascular: RRR, heart sounds normal, no murmur or gallops, trace peripheral edema  Musculoskeletal: No deformities, no cyanosis or clubbing  Neuro: alert, awake, non focal  Skin: Warm, no lesions or rash      Assessment & Plan:   OSA (obstructive sleep apnea) Sleep study from June was reviewed with him today.  It confirmed persistent obstructive sleep apnea, severe.  He is willing to retry CPAP and I will write this for him, AutoSet 7-20 cmH2O, best fit mask, heated humidity.  He needs to follow-up in about 2 months in our office to confirm compliance.  He did ask about the inspire device, knows that he would have to lose some weight before he could be a candidate.  We could reconsider this if he fails CPAP  OSA and COPD overlap syndrome (HCC) Significant obstructive lung disease, exacerbated in September when he had COVID-19.  He is improved but not quite back to his baseline.  No wheezing on exam today.  Plan to continue his home Trelegy, Dupixent , albuterol  if needed  Chronic respiratory failure with hypoxia (HCC) Documented good compliance with his oxygen .  Need to continue 2-3 L/min depending on level exertion  Pulmonary nodules Have been stable since he had his  bronchoscopy and negative biopsies done 04/2022.  Etiology unclear but suspect inflammatory in nature.  I will plan to repeat his CT in November 2026.  If stable at that time then we can determine whether any more dedicated follow-up scans are indicated.   I personally spent a total of 40 minutes in the care of the patient today including preparing to see the patient, getting/reviewing separately obtained history, performing a medically appropriate exam/evaluation, counseling and educating, placing orders, documenting clinical information in the EHR, independently interpreting results, and communicating results.   Lamar Chris, MD, PhD 04/14/2024, 4:45 PM [facility] [phone] or if no answer before 7:00PM call [phone] For any issues after 7:00PM please call eLink [phone]

## 2024-04-14 NOTE — Assessment & Plan Note (Signed)
 Sleep study from June was reviewed with him today.  It confirmed persistent obstructive sleep apnea, severe.  He is willing to retry CPAP and I will write this for him, AutoSet 7-20 cmH2O, best fit mask, heated humidity.  He needs to follow-up in about 2 months in our office to confirm compliance.  He did ask about the inspire device, knows that he would have to lose some weight before he could be a candidate.  We could reconsider this if he fails CPAP

## 2024-04-14 NOTE — Assessment & Plan Note (Signed)
 Significant obstructive lung disease, exacerbated in September when he had COVID-19.  He is improved but not quite back to his baseline.  No wheezing on exam today.  Plan to continue his home Trelegy, Dupixent , albuterol  if needed

## 2024-04-14 NOTE — Assessment & Plan Note (Signed)
 Have been stable since he had his bronchoscopy and negative biopsies done 04/2022.  Etiology unclear but suspect inflammatory in nature.  I will plan to repeat his CT in November 2026.  If stable at that time then we can determine whether any more dedicated follow-up scans are indicated.

## 2024-04-15 ENCOUNTER — Encounter (HOSPITAL_BASED_OUTPATIENT_CLINIC_OR_DEPARTMENT_OTHER): Payer: Self-pay | Admitting: Family Medicine

## 2024-04-18 ENCOUNTER — Ambulatory Visit: Admitting: Orthopaedic Surgery

## 2024-04-18 ENCOUNTER — Ambulatory Visit (INDEPENDENT_AMBULATORY_CARE_PROVIDER_SITE_OTHER): Admitting: Adult Health

## 2024-04-18 ENCOUNTER — Other Ambulatory Visit: Payer: Self-pay

## 2024-04-18 ENCOUNTER — Encounter: Payer: Self-pay | Admitting: Orthopaedic Surgery

## 2024-04-18 VITALS — BP 143/88 | HR 100

## 2024-04-18 DIAGNOSIS — M1712 Unilateral primary osteoarthritis, left knee: Secondary | ICD-10-CM

## 2024-04-18 DIAGNOSIS — M1711 Unilateral primary osteoarthritis, right knee: Secondary | ICD-10-CM

## 2024-04-18 DIAGNOSIS — M170 Bilateral primary osteoarthritis of knee: Secondary | ICD-10-CM | POA: Diagnosis not present

## 2024-04-18 DIAGNOSIS — E1129 Type 2 diabetes mellitus with other diabetic kidney complication: Secondary | ICD-10-CM | POA: Diagnosis not present

## 2024-04-18 DIAGNOSIS — G43709 Chronic migraine without aura, not intractable, without status migrainosus: Secondary | ICD-10-CM

## 2024-04-18 DIAGNOSIS — R809 Proteinuria, unspecified: Secondary | ICD-10-CM | POA: Diagnosis not present

## 2024-04-18 DIAGNOSIS — E876 Hypokalemia: Secondary | ICD-10-CM | POA: Diagnosis not present

## 2024-04-18 DIAGNOSIS — E559 Vitamin D deficiency, unspecified: Secondary | ICD-10-CM | POA: Diagnosis not present

## 2024-04-18 DIAGNOSIS — I129 Hypertensive chronic kidney disease with stage 1 through stage 4 chronic kidney disease, or unspecified chronic kidney disease: Secondary | ICD-10-CM | POA: Diagnosis not present

## 2024-04-18 MED ORDER — LIDOCAINE HCL 1 % IJ SOLN
3.0000 mL | INTRAMUSCULAR | Status: AC | PRN
  Administered 2024-04-18: 3 mL

## 2024-04-18 MED ORDER — METHYLPREDNISOLONE ACETATE 40 MG/ML IJ SUSP
40.0000 mg | INTRAMUSCULAR | Status: AC | PRN
  Administered 2024-04-18: 40 mg via INTRA_ARTICULAR

## 2024-04-18 MED ORDER — METOPROLOL TARTRATE 25 MG PO TABS: 25.0000 mg | ORAL_TABLET | Freq: Three times a day (TID) | ORAL | 3 refills | Status: DC

## 2024-04-18 MED ORDER — ONABOTULINUMTOXINA 200 UNITS IJ SOLR
155.0000 [IU] | Freq: Once | INTRAMUSCULAR | Status: AC
  Administered 2024-04-18: 155 [IU] via INTRAMUSCULAR

## 2024-04-18 NOTE — Progress Notes (Signed)
 Botox - 200 units x 1 vial Lot: [id number] Expiration: 10/2025 NDC: 9976-6078-97  Bacteriostatic 0.9% Sodium Chloride - 4 mL  Lot: [id number] Expiration: 02/2025 NDC: 9590-8033-97   Dx: H56.290 S/P  Witnessed by Sherrod LATHER

## 2024-04-18 NOTE — Progress Notes (Signed)
 HPI: [name] is well-known to our department service comes in today status post bilateral knee injections 01/18/2024.  He states that the cortisone injections gave him some relief but did not last long.  He is asking about viscosupplementation injections both knees.  He has had these in the past and his wonder if he can have them again.  He has known arthritis of both knees.  He is not a surgical candidate for knee replacement given his BMI.  He is diabetic but is well-controlled last hemoglobin A1c was 5.3.  He is also wanting to get established with a spine surgeon as he is seeing Dr. Lucilla and has been operated on in the past by Dr. Lucilla.  Review of systems: Denies fevers chills  Physical exam: General well-developed well-nourished male no acute distress.  Ambulates with a cane. Bilateral knees: Good range of motion of both knees.  Significant patellofemoral crepitus both knees with passive range of motion.  He has tenderness to the lateral joint line of both knees and the medial joint line of the left knee.  No gross instability of either knee with valgus varus stressing.  No abnormal warmth erythema or effusion on either knee.  Radiographs: Left knee: 2 views shows moderate narrowing medial joint line.  Mild to moderate patellofemoral changes.  Periarticular spurring off the lateral joint line.  No acute fractures.  Knee is well located.  Right knee: 2 views showed no acute fractures no acute findings.  Near bone-on-bone medial compartment.  Lateral compartment with mild arthritic changes.  Mild to moderate patellofemoral changes.  Knee is well located.  Otherwise no bony abnormalities.  Impression: Bilateral knee arthritis  Plan: Given the fact the patient is unable to have viscosupplementation injections today he is requesting for any type of relief.  Therefore he was given cortisone injections both knees today.  He is due to watch his glucose levels closely.  Questions were encouraged and  answered at length.  Will see him back once approval has been given for his viscosupplementation injections.  Questions were encouraged and answered by Dr. Vernetta or myself.  He is encouraged to make an appoint with Dr. Georgina for his cervical spine and back pain.  Encouraged him to continue to do water therapy as he has found this beneficial for his knees and his overall health.     Procedure Note  Patient: Mark Harrell.             Date of Birth: 02-Mar-1961           MRN: [id number]             Visit Date: 04/18/2024  Procedures: Visit Diagnoses:  1. Bilateral primary osteoarthritis of knee     Large Joint Inj: bilateral knee on 04/18/2024 5:36 PM Indications: pain Details: 22 G 1.5 in needle, anterolateral approach  Arthrogram: No  Medications (Right): 3 mL lidocaine  1 %; 40 mg methylPREDNISolone  acetate 40 MG/ML Medications (Left): 3 mL lidocaine  1 %; 40 mg methylPREDNISolone  acetate 40 MG/ML Outcome: tolerated well, no immediate complications Procedure, treatment alternatives, risks and benefits explained, specific risks discussed. Consent was given by the patient. Immediately prior to procedure a time out was called to verify the correct patient, procedure, equipment, support staff and site/side marked as required. Patient was prepped and draped in the usual sterile fashion.

## 2024-04-18 NOTE — Progress Notes (Signed)
 04/18/24: He reports that Botox  has been working well for him.  He typically gets headaches at the beginning of his cycle at the end.  He recently saw Dr. Darleen in September for memory workup.  MRI and blood work was unremarkable.  He returns today for Botox  injections   12/29/23: botox  still working well.  Reports that some of his headaches start in the back of the neck.  He denies an aura.  Denies sensory changes.  Does report photophobia phonophobia and nausea.    09/29/23: Botox  continues to work well for him. Toward the end of the 3 months headaches may increase in the frequency. He states for at least two months he has no headaches.   06/18/23: Botox  continues to work well. Headaches tend to be located in he occipital region and shoulder. Increase in the last couple of weeks since botox  is due. No new medical history or medications.    03/16/23: botox  is working well. Reports on and off neck pain. Reports that orthopedist said that botox  may help.  12/22/22: First botox  with our office. When getting botox  through [facility] it was beneficial for his migraines.   BOTOX  PROCEDURE NOTE FOR MIGRAINE HEADACHE    Contraindications and precautions discussed with patient(above). Aseptic procedure was observed and patient tolerated procedure. Procedure performed by Duwaine Russell, NP  The condition has existed for more than 6 months, and pt does not have a diagnosis of ALS, Myasthenia Gravis or Lambert-Eaton Syndrome.  Risks and benefits of injections discussed and pt agrees to proceed with the procedure.  Written consent obtained. Reviewed potential side effects.     Indication/Diagnosis: chronic migraine BOTOX (G9414) injection was performed according to protocol by [employer]. 200 units of BOTOX  was dissolved into 4 cc NS.   NDC: [address]-98  Type of toxin: Botox   Botox - 200 units x 1 vial Lot: [id number] Expiration: 10/2025 NDC: 9976-6078-97   Bacteriostatic 0.9% Sodium Chloride - 4 mL   Lot: [id number] Expiration: 02/2025 NDC: 9590-8033-97    Dx: H56.290        Description of procedure:  The patient was placed in a sitting position. The standard protocol was used for Botox  as follows, with 5 units of Botox  injected at each site:   -Procerus muscle, midline injection  -Corrugator muscle, bilateral injection  -Frontalis muscle, bilateral injection, with 2 sites each side, medial injection was performed in the upper one third of the frontalis muscle, in the region vertical from the medial inferior edge of the superior orbital rim. The lateral injection was again in the upper one third of the forehead vertically above the lateral limbus of the cornea, 1.5 cm lateral to the medial injection site.  -Temporalis muscle injection, 4 sites, bilaterally. The first injection was 3 cm above the tragus of the ear, second injection site was 1.5 cm to 3 cm up from the first injection site in line with the tragus of the ear. The third injection site was 1.5-3 cm forward between the first 2 injection sites. The fourth injection site was 1.5 cm posterior to the second injection site.  -Occipitalis muscle injection, 3 sites, bilaterally. The first injection was done one half way between the occipital protuberance and the tip of the mastoid process behind the ear. The second injection site was done lateral and superior to the first, 1 fingerbreadth from the first injection. The third injection site was 1 fingerbreadth superiorly and medially from the first injection site.  -Cervical paraspinal muscle injection, 2 sites, bilateral knee  first injection site was 1 cm from the midline of the cervical spine, 3 cm inferior to the lower border of the occipital protuberance. The second injection site was 1.5 cm superiorly and laterally to the first injection site.  -Trapezius muscle injection was performed at 3 sites, bilaterally. The first injection site was in the upper trapezius muscle halfway  between the inflection point of the neck, and the acromion. The second injection site was one half way between the acromion and the first injection site. The third injection was done between the first injection site and the inflection point of the neck.   Will return for repeat injection in 3 months.   A 200 units of Botox  was used, 155 units were injected, the rest of the Botox  was wasted. The patient tolerated the procedure well, there were no complications of the above procedure.  Duwaine Russell, MSN, NP-C 04/18/2024, 9:09 AM [facility] [address], [address] [place], [place] [address] [phone]

## 2024-04-19 ENCOUNTER — Encounter: Payer: Self-pay | Admitting: Allergy & Immunology

## 2024-04-19 ENCOUNTER — Ambulatory Visit: Admitting: Allergy & Immunology

## 2024-04-19 VITALS — BP 130/78 | HR 99 | Temp 98.7°F | Ht 72.0 in | Wt 342.0 lb

## 2024-04-19 DIAGNOSIS — B999 Unspecified infectious disease: Secondary | ICD-10-CM | POA: Diagnosis not present

## 2024-04-19 DIAGNOSIS — J3089 Other allergic rhinitis: Secondary | ICD-10-CM | POA: Diagnosis not present

## 2024-04-19 DIAGNOSIS — L2084 Intrinsic (allergic) eczema: Secondary | ICD-10-CM

## 2024-04-19 DIAGNOSIS — K219 Gastro-esophageal reflux disease without esophagitis: Secondary | ICD-10-CM | POA: Diagnosis not present

## 2024-04-19 DIAGNOSIS — J4489 Other specified chronic obstructive pulmonary disease: Secondary | ICD-10-CM

## 2024-04-19 DIAGNOSIS — R350 Frequency of micturition: Secondary | ICD-10-CM | POA: Diagnosis not present

## 2024-04-19 DIAGNOSIS — J302 Other seasonal allergic rhinitis: Secondary | ICD-10-CM

## 2024-04-19 MED ORDER — CARBINOXAMINE MALEATE 4 MG PO TABS: 4.0000 mg | ORAL_TABLET | Freq: Two times a day (BID) | ORAL | 1 refills | Status: AC

## 2024-04-19 MED ORDER — PANTOPRAZOLE SODIUM 40 MG PO TBEC: 40.0000 mg | DELAYED_RELEASE_TABLET | Freq: Every day | ORAL | 1 refills | Status: AC

## 2024-04-19 MED ORDER — EPINEPHRINE 0.3 MG/0.3ML IJ SOAJ: 0.3000 mg | INTRAMUSCULAR | 2 refills | Status: AC | PRN

## 2024-04-19 MED ORDER — MONTELUKAST SODIUM 10 MG PO TABS: 10.0000 mg | ORAL_TABLET | Freq: Every day | ORAL | 1 refills | Status: AC

## 2024-04-19 MED ORDER — IPRATROPIUM BROMIDE 0.03 % NA SOLN: 2.0000 | Freq: Three times a day (TID) | NASAL | 1 refills | Status: AC | PRN

## 2024-04-19 MED ORDER — OLOPATADINE HCL 0.6 % NA SOLN: 2.0000 | Freq: Two times a day (BID) | NASAL | 1 refills | Status: AC | PRN

## 2024-04-19 MED ORDER — CETIRIZINE HCL 10 MG PO TABS: 10.0000 mg | ORAL_TABLET | Freq: Every day | ORAL | 1 refills | Status: AC

## 2024-04-19 MED ORDER — IPRATROPIUM-ALBUTEROL 0.5-2.5 (3) MG/3ML IN SOLN: 3.0000 mL | Freq: Four times a day (QID) | RESPIRATORY_TRACT | 1 refills | Status: AC | PRN

## 2024-04-19 NOTE — Progress Notes (Signed)
 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 - seems to be stable at this point without doxycycline     Asthma-COPD overlap syndrome with worsening pulmonary status since contracting COVID pneumonia in January 2023   Previously doing well on Nucala  and then Dupixent  (stopped January 2023 after his COVID PNA, but now back on Dupixent ) - latest AEC 227 in December 2024   Bilateral pulmonary nodules - benign biopsies in December 2023   Hymenoptera allergy - EpiPen  up to date   GERD - on PRN H2 blocker    Obesity - with resulting nerve impingement and joint pain   Chronic back pain - s/p recent neck surgery   Migraines - on botulinum injections   Polypharmacy   Normal echocardiogram (May 2023)   Recent passing of his mother  Plan/Recommendations:   1. 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 - Ok to stay off of doxycycline .   2. Asthma-COPD overlap syndrome - Lung testing not done today.  - We will not make any changes at this time. - I am glad that the Dupixent  has been help with your skin at least.  - Daily controller medication(s): Breztri  two puffs twice daily with spacer and Singulair  10mg  daily and Dupixent  every two weeks - Prior to physical activity: albuterol  2 puffs 10-15 minutes before physical activity - Rescue medications: albuterol  1-2 puffs every 4-6 hours as needed or DuoNeb nebulizer one vial every 4-6 hours as needed - Asthma control goals:  * Full participation in all desired activities (may need albuterol  before activity) * Albuterol  use two time or less a week on average (not counting use with activity) * Cough interfering with sleep two time or less a month * Oral  steroids no more than once a year * No hospitalizations   3. Allergic rhinitis (sweet vernal grass, box elder, cat, weeds, ragweed, molds, cockroach, dust mite) - Continue with saline mist 1-2 times daily.  - Continue Patanase  two sprays per nostril twice daily. - Continue with Ipratropium one spray per nostril every 8 hours as needed for runny noses.  - Continue with Cetirizine  10mg  daily as needed for runny nose, sneezing, itchy watery eyes.  - Continue with Singulair  (montelukast ) 10mg  in the evening.   4. GERD - Stop the famotidine  20mg  twice daily.  - Add on Protonix  40 mg daily to see if this helps with any improvement in symptoms at that time.   5. Return in about 3 months (around 07/20/2024). You can have the follow up appointment with Dr. Iva or a Nurse Practicioner (our Nurse Practitioners are excellent and always have Physician oversight!).   Subjective:   Mark Harrell. is a 63 y.o. male presenting today for follow up of  Chief Complaint  Patient presents with   Allergic Rhinitis     Sneezing, itchy eyes, itchy nose, stuffy & runny nose.   Asthma    Coughing, wheezing, SOB, chest tightness and coughing at night.   Just approved for a C-Pap machine, but has not received it yet.   COPD    Mark Harrell. has a history of the following: Patient Active Problem List   Diagnosis Date Noted   Hymenoptera  reaction 03/25/2024   COVID-19 02/22/2024   Acute on chronic diastolic heart failure (HCC) 09/30/2023   Diabetes mellitus (HCC) 08/31/2023   Rash 08/31/2023   Cognitive changes 08/31/2023   Unilateral primary osteoarthritis, left knee 02/24/2022   Unilateral primary osteoarthritis, right knee 02/24/2022   Pulmonary nodules 01/28/2022   History of adenomatous polyp of colon 01/08/2022   Hypokalemia 09/26/2021   Dyspnea 09/25/2021   Acute deep vein thrombosis (DVT) of right lower extremity (HCC) 09/24/2021   OSA (obstructive sleep apnea) 09/24/2021    Herniation of cervical intervertebral disc with radiculopathy    Status post cervical discectomy 08/20/2020   Tracheomalacia 05/26/2019   Acquired tracheomalacia 05/26/2019   HNP (herniated nucleus pulposus) with myelopathy, cervical 05/23/2019    Class: Chronic   Spinal stenosis of cervical region 05/23/2019    Class: Chronic   Status post cervical spinal fusion 05/23/2019   Chronic respiratory failure with hypoxia (HCC) 04/12/2018   Bilateral lower extremity edema 12/29/2017   Hymenoptera allergy 11/10/2017   Family history of colonic polyps 08/07/2017   Diverticulosis 08/07/2017   Flatus 08/07/2017   Myofascial pain 08/06/2017   Seasonal and perennial allergic rhinitis 04/23/2017   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/2017   Cervicalgia 04/01/2017   Recurrent pneumonia 03/09/2017   Other spondylosis with radiculopathy, cervical region 02/09/2017   Depression with anxiety 01/04/2017   Memory difficulty 12/05/2016   Diarrhea in adult patient 12/03/2016   Internal hemorrhoids 12/03/2016   Chronic migraine without aura without status migrainosus, not intractable 10/30/2016   Lumbar radiculopathy 05/15/2016   Osteoarthritis of spine with radiculopathy, lumbar region 05/15/2016   Risk for falls 05/15/2016   Recurrent infections 03/29/2016   Chronic nonseasonal allergic rhinitis due to pollen 03/29/2016   Polypharmacy 01/16/2016   Morbid obesity with BMI of 45.0-49.9, adult (HCC) 03/06/2015   SDAT 02/05/2015   OSA and COPD overlap syndrome (HCC) 02/05/2015   Medication management 08/02/2014   GERD (gastroesophageal reflux disease) 05/09/2014   Vitamin D  deficiency 08/01/2013   Positive TB test 07/29/2011   Diverticula of colon 05/07/2011   Hypertension 01/31/2011   Hyperlipidemia, mixed 01/31/2011   BPH (benign prostatic hyperplasia) 01/31/2011   Testosterone  Deficiency 01/31/2011   IBS (irritable bowel syndrome) 01/31/2011   Partial complex seizure disorder with intractable epilepsy  (HCC) 01/31/2011   Depression, major, recurrent, in partial remission (HCC) 01/31/2011   Asthma-COPD overlap syndrome (HCC) 01/31/2011    History obtained from: chart review and patient.  Discussed the use of AI scribe software for clinical note transcription with the patient and/or guardian, who gave verbal consent to proceed.  Mark Harrell is a 63 y.o. male presenting for a follow up visit.  Mark Harrell was last seen in October 2025.  At that time, we continue with Breztri  2 puffs twice daily as well as Singulair  10 mg daily.  For her allergic rhinitis, we continue with Astelin  as well as ipratropium and cetirizine  and combination with Singulair .  For his reflux, we continue with pantoprazole  40 mg once daily.  He continue to avoid stinging insects.  Since the last visit, he has been stable.   Asthma/Respiratory Symptom History: He experiences ongoing breathing difficulties, particularly during physical activities, describing it as 'living package to package'. He is currently using Breztri  and has restarted Dupixent , which he feels helps his skin but is unsure of its impact on his breathing. He has a history of using Nucala  but discontinued it due to other health issues.  Recent imaging, including a CT scan, was performed  and the patient reports that his pulmonologist was pleased that the nodules and mass had not changed. A PET scan was ambiguous. He has a history of a lung biopsy which was unremarkable.  He seems to have more problems when he does any kind of physical exertion.  Recent chest CT (November 19th): Stable appearance of bilateral cluster of pulmonary nodules measuring up to 3.4 x 1.8 cm, likely sequela from chronic infection/inflammation including sarcoidosis. Correlate with clinical findings. Follow-up to ensure stability.  Allergic Rhinitis Symptom History: He experiences chronic nasal congestion and rhinorrhea, which are not relieved by nasal sprays or saline. He is currently taking  cetirizine  but is considering trying a different antihistamine due to persistent symptoms.  He has a history of spine issues, including degenerative changes and a status post anterior cervical discectomy and fusion (ACDF). Recent imaging revealed a cracked rib and arteriosclerosis; the patient is not certain if these findings are new. He experiences pain associated with these conditions.  He has a history of COVID-19 infection twice, with the first episode being more severe. His uncle and sister also contracted COVID-19, but he experienced more severe symptoms than they did. He was unable to obtain Paxlovid  during his infection due to prescription issues.  He has a history of arthritis and ongoing spine issues, which have been noted in previous scans.     Otherwise, there have been no changes to his past medical history, surgical history, family history, or social history.    Review of systems otherwise negative other than that mentioned in the HPI.    Objective:   Blood pressure 130/78, pulse 99, temperature 98.7 F (37.1 C), height 6' (1.829 m), weight (!) 342 lb (155.1 kg), SpO2 91%. Body mass index is 46.38 kg/m.    Physical Exam Vitals reviewed.  Constitutional:      Appearance: He is well-developed. He is morbidly obese. He is not ill-appearing or toxic-appearing.     Comments: He seems more energetic today.  HENT:     Head: Normocephalic and atraumatic.     Right Ear: Tympanic membrane, ear canal and external ear normal.     Left Ear: Tympanic membrane, ear canal and external ear normal.     Nose: No nasal deformity, septal deviation, mucosal edema or rhinorrhea.     Right Turbinates: Enlarged, swollen and pale.     Left Turbinates: Enlarged, swollen and pale.     Right Sinus: No maxillary sinus tenderness or frontal sinus tenderness.     Left Sinus: No maxillary sinus tenderness or frontal sinus tenderness.     Comments: No nasal polyps.     Mouth/Throat:     Lips:  Pink.     Mouth: Mucous membranes are moist. Mucous membranes are not pale and not dry.     Pharynx: Uvula midline.  Eyes:     General: Lids are normal. Allergic shiner present.        Right eye: No discharge.        Left eye: No discharge.     Conjunctiva/sclera: Conjunctivae normal.     Right eye: Right conjunctiva is not injected. No chemosis.    Left eye: Left conjunctiva is not injected. No chemosis.    Pupils: Pupils are equal, round, and reactive to light.  Cardiovascular:     Rate and Rhythm: Normal rate and regular rhythm.     Heart sounds: Normal heart sounds.  Pulmonary:     Effort: Pulmonary effort is normal. No tachypnea, accessory  muscle usage or respiratory distress.     Breath sounds: Normal breath sounds. Decreased air movement and transmitted upper airway sounds present. No wheezing, rhonchi or rales.     Comments: Decreased air movement at the bases.  Chest:     Chest wall: No tenderness.  Lymphadenopathy:     Cervical: No cervical adenopathy.  Skin:    General: Skin is warm.     Capillary Refill: Capillary refill takes less than 2 seconds.     Coloration: Skin is not pale.     Findings: No abrasion, erythema, petechiae or rash. Rash is not papular, urticarial or vesicular.     Comments: No eczematous or urticarial lesions noted.   Neurological:     Mental Status: He is alert.  Psychiatric:        Behavior: Behavior is cooperative.      Diagnostic studies: none      Marty Shaggy, MD  [facility] 

## 2024-04-19 NOTE — Patient Instructions (Addendum)
 1. 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 - Ok to stay off of doxycycline .   2. Asthma-COPD overlap syndrome - Lung testing not done today.  - We will not make any changes at this time. - I am glad that the Dupixent  has been help with your skin at least.  - Daily controller medication(s): Breztri  two puffs twice daily with spacer and Singulair  10mg  daily and Dupixent  every two weeks - Prior to physical activity: albuterol  2 puffs 10-15 minutes before physical activity - Rescue medications: albuterol  1-2 puffs every 4-6 hours as needed or DuoNeb nebulizer one vial every 4-6 hours as needed - Asthma control goals:  * Full participation in all desired activities (may need albuterol  before activity) * Albuterol  use two time or less a week on average (not counting use with activity) * Cough interfering with sleep two time or less a month * Oral steroids no more than once a year * No hospitalizations   3. Allergic rhinitis (sweet vernal grass, box elder, cat, weeds, ragweed, molds, cockroach, dust mite) - Continue with saline mist 1-2 times daily.  - Continue Patanase  two sprays per nostril twice daily. - Continue with Ipratropium one spray per nostril every 8 hours as needed for runny noses.  - Continue with Cetirizine  10mg  daily as needed for runny nose, sneezing, itchy watery eyes.  - Continue with Singulair  (montelukast ) 10mg  in the evening.   4. GERD - Stop the famotidine  20mg  twice daily.  - Add on Protonix  40 mg daily to see if this helps with any improvement in symptoms at that time.   5. Return in about 3 months (around 07/20/2024). You can have the follow up appointment with Dr. Iva or a Nurse Practicioner (our Nurse Practitioners are excellent and always have Physician oversight!).    Please inform us  of any Emergency Department visits, hospitalizations, or changes in symptoms. Call us   before going to the ED for breathing or allergy symptoms since we might be able to fit you in for a sick visit. Feel free to contact us  anytime with any questions, problems, or concerns.  It was a pleasure to see you again today!  Websites that have reliable patient information: 1. American Academy of Asthma, Allergy, and Immunology: www.aaaai.org 2. Food Allergy Research and Education (FARE): foodallergy.org 3. Mothers of Asthmatics: http://www.asthmacommunitynetwork.org 4. American College of Allergy, Asthma, and Immunology: www.acaai.org      "Like" us  on Facebook and Instagram for our latest updates!      A healthy democracy works best when [employer] participate! Make sure you are registered to vote! If you have moved or changed any of your contact information, you will need to get this updated before voting! Scan the QR codes below to learn more!

## 2024-04-20 ENCOUNTER — Other Ambulatory Visit: Payer: Self-pay

## 2024-04-24 ENCOUNTER — Other Ambulatory Visit: Payer: Self-pay | Admitting: Allergy & Immunology

## 2024-04-24 ENCOUNTER — Other Ambulatory Visit: Payer: Self-pay | Admitting: Orthopaedic Surgery

## 2024-04-25 ENCOUNTER — Other Ambulatory Visit (HOSPITAL_COMMUNITY): Payer: Self-pay

## 2024-04-25 ENCOUNTER — Ambulatory Visit: Admitting: Cardiovascular Disease

## 2024-04-26 ENCOUNTER — Ambulatory Visit (HOSPITAL_BASED_OUTPATIENT_CLINIC_OR_DEPARTMENT_OTHER): Admitting: Family Medicine

## 2024-04-26 ENCOUNTER — Encounter (HOSPITAL_BASED_OUTPATIENT_CLINIC_OR_DEPARTMENT_OTHER): Payer: Self-pay | Admitting: Family Medicine

## 2024-04-26 VITALS — BP 135/103 | HR 87 | Temp 98.3°F | Resp 18 | Ht 72.0 in | Wt 328.0 lb

## 2024-04-26 DIAGNOSIS — E1142 Type 2 diabetes mellitus with diabetic polyneuropathy: Secondary | ICD-10-CM | POA: Diagnosis not present

## 2024-04-26 DIAGNOSIS — Z7984 Long term (current) use of oral hypoglycemic drugs: Secondary | ICD-10-CM

## 2024-04-26 DIAGNOSIS — J441 Chronic obstructive pulmonary disease with (acute) exacerbation: Secondary | ICD-10-CM | POA: Insufficient documentation

## 2024-04-26 MED ORDER — AZITHROMYCIN 250 MG PO TABS: ORAL_TABLET | ORAL | 0 refills | Status: AC

## 2024-04-26 MED ORDER — TIRZEPATIDE 12.5 MG/0.5ML ~~LOC~~ SOAJ: 12.5000 mg | SUBCUTANEOUS | 1 refills | Status: AC

## 2024-04-26 MED ORDER — PREDNISONE 20 MG PO TABS: 40.0000 mg | ORAL_TABLET | Freq: Every day | ORAL | 0 refills | Status: DC

## 2024-04-26 NOTE — Assessment & Plan Note (Signed)
 Generally has had good control of hemoglobin A1c. Continues with metformin  as well as Mounjaro  at 10 mg, interested in increasing dose of Mounjaro .  Denies any significant side effects or issues with current dose of Mounjaro . Due for recheck of hemoglobin A1c today Foot exam completed today, documented in chart. Due for urine ACR, ordered today We can increase dose in Mounjaro , will increase to 12.5 mg.  Cautioned on potential side effects.

## 2024-04-26 NOTE — Progress Notes (Signed)
    Procedures performed today:    None.  Independent interpretation of notes and tests performed by another provider:   None.  Brief History, Exam, Impression, and Recommendations:    BP (!) 135/103 (BP Location: Left Arm, Patient Position: Sitting, Cuff Size: Large)   Pulse 87   Temp 98.3 F (36.8 C) (Oral)   Resp 18   Ht 6' (1.829 m)   Wt (!) 328 lb (148.8 kg)   SpO2 94%   BMI 44.48 kg/m   Type 2 diabetes mellitus with diabetic polyneuropathy, without long-term current use of insulin  (HCC) Assessment & Plan: Generally has had good control of hemoglobin A1c. Continues with metformin  as well as Mounjaro  at 10 mg, interested in increasing dose of Mounjaro .  Denies any significant side effects or issues with current dose of Mounjaro . Due for recheck of hemoglobin A1c today Foot exam completed today, documented in chart. Due for urine ACR, ordered today We can increase dose in Mounjaro , will increase to 12.5 mg.  Cautioned on potential side effects.  Orders: -     Microalbumin / creatinine urine ratio -     Hemoglobin A1c  COPD exacerbation (HCC) Assessment & Plan: Patient notes that he began to feel sick about a week ago when he was traveling to go to the beach over the weekend.  He notes that he has had some sinus congestion, sore throat, increased cough, increased sputum production.  He does have history of COPD.  Has had mild shortness of breath, no significant difficulty with breathing.  No fevers reported. On exam, patient is in no acute distress, vital signs stable.  Lungs with diffuse expiratory wheezes bilaterally.  Cardiovascular exam with regular rate and rhythm. We discussed considerations, given symptoms, most likely dealing with COPD exacerbation.  We discussed considerations in regards to treatment.  We will proceed with short course of prednisone  as well as treatment with azithromycin . Cautioned on potential side effects, discussed signs and symptoms to be  mindful of with recommendation to return to clinic or present to emergency department if any worsening does occur   Other orders -     predniSONE ; Take 2 tablets (40 mg total) by mouth daily with breakfast.  Dispense: 14 tablet; Refill: 0 -     Azithromycin ; Take 2 tablets on day 1, then 1 tablet daily on days 2 through 5  Dispense: 6 tablet; Refill: 0 -     Tirzepatide ; Inject 12.5 mg into the skin once a week.  Dispense: 6 mL; Refill: 1  Return in about 4 months (around 08/25/2024) for diabetes, med check.   ___________________________________________ Zacchary [place], MD, ABFM, CAQSM Primary Care and Sports Medicine [facility]

## 2024-04-26 NOTE — Assessment & Plan Note (Signed)
 Patient notes that he began to feel sick about a week ago when he was traveling to go to the beach over the weekend.  He notes that he has had some sinus congestion, sore throat, increased cough, increased sputum production.  He does have history of COPD.  Has had mild shortness of breath, no significant difficulty with breathing.  No fevers reported. On exam, patient is in no acute distress, vital signs stable.  Lungs with diffuse expiratory wheezes bilaterally.  Cardiovascular exam with regular rate and rhythm. We discussed considerations, given symptoms, most likely dealing with COPD exacerbation.  We discussed considerations in regards to treatment.  We will proceed with short course of prednisone  as well as treatment with azithromycin . Cautioned on potential side effects, discussed signs and symptoms to be mindful of with recommendation to return to clinic or present to emergency department if any worsening does occur

## 2024-04-27 ENCOUNTER — Other Ambulatory Visit: Payer: Self-pay

## 2024-04-27 ENCOUNTER — Ambulatory Visit (HOSPITAL_BASED_OUTPATIENT_CLINIC_OR_DEPARTMENT_OTHER): Payer: Self-pay | Admitting: Family Medicine

## 2024-04-27 LAB — HEMOGLOBIN A1C
Est. average glucose Bld gHb Est-mCnc: 103 mg/dL
Hgb A1c MFr Bld: 5.2 % (ref 4.8–5.6)

## 2024-04-27 LAB — MICROALBUMIN / CREATININE URINE RATIO
Creatinine, Urine: 386.8 mg/dL
Microalb/Creat Ratio: 51 mg/g{creat} — ABNORMAL HIGH (ref 0–29)
Microalbumin, Urine: 199.1 ug/mL

## 2024-04-28 DIAGNOSIS — R350 Frequency of micturition: Secondary | ICD-10-CM | POA: Diagnosis not present

## 2024-04-29 ENCOUNTER — Ambulatory Visit (HOSPITAL_BASED_OUTPATIENT_CLINIC_OR_DEPARTMENT_OTHER): Attending: Orthopaedic Surgery | Admitting: Physical Therapy

## 2024-04-29 ENCOUNTER — Encounter (HOSPITAL_BASED_OUTPATIENT_CLINIC_OR_DEPARTMENT_OTHER): Payer: Self-pay | Admitting: Physical Therapy

## 2024-04-29 DIAGNOSIS — M25562 Pain in left knee: Secondary | ICD-10-CM | POA: Insufficient documentation

## 2024-04-29 DIAGNOSIS — R2689 Other abnormalities of gait and mobility: Secondary | ICD-10-CM | POA: Insufficient documentation

## 2024-04-29 DIAGNOSIS — M25561 Pain in right knee: Secondary | ICD-10-CM | POA: Diagnosis present

## 2024-04-29 DIAGNOSIS — G8929 Other chronic pain: Secondary | ICD-10-CM | POA: Diagnosis present

## 2024-04-29 DIAGNOSIS — M6281 Muscle weakness (generalized): Secondary | ICD-10-CM | POA: Diagnosis present

## 2024-04-29 NOTE — Therapy (Signed)
 OUTPATIENT PHYSICAL THERAPY LOWER EXTREMITY TREATMENT   Patient Name: Mark Harrell. MRN: [id number] DOB:1961-02-23, 63 y.o., male Today's Date: 04/29/2024  END OF SESSION:  PT End of Session - 04/29/24 1346     Visit Number 6    Date for Recertification  04/29/24    Authorization Type [employer]    PT Start Time 1345    PT Stop Time 1425    PT Time Calculation (min) 40 min    Activity Tolerance Patient tolerated treatment well    Behavior During Therapy WFL for tasks assessed/performed           Past Medical History:  Diagnosis Date   Allergy    Anesthesia complication requiring reversal agent administration    ? from central apnea, very difficult to get off vent   Anxiety    Arthritis    osteo   Asthma    BPH (benign prostatic hyperplasia)    Cataract    CHF (congestive heart failure) (HCC)    Complication of anesthesia    difficulty waking , they twlight me because of my respiratory problems    COPD (chronic obstructive pulmonary disease) (HCC)    COVID    moderate case 2023   Depression    Diabetes mellitus (HCC) 08/31/2023   DVT (deep venous thrombosis) (HCC) 2023   right leg   Dyspnea    on exertion   Enlarged heart    Family history of adverse reaction to anesthesia    mother trouble waking up, and heart stopped   GERD (gastroesophageal reflux disease)    Headache    botox  injections for headaches   Hyperlipidemia    Hypertension    Hypogonadism male    IBS (irritable bowel syndrome)    Kidney stones    Memory difficulties    short term memories   Neuromuscular disorder (HCC)    Neuropathy    Obesity    On home oxygen  therapy    on 2 liter   OSA (obstructive sleep apnea)    not using cpap   Oxygen  deficiency    Paralysis (HCC)    left hand - small    due to accident with an head injury   Pneumonia    Pre-diabetes    Prostatitis    Sleep apnea    Ulcer    Past Surgical History:  Procedure Laterality Date   ABDOMINAL SURGERY     ANKLE  FRACTURE SURGERY Right    ANTERIOR CERVICAL DECOMP/DISCECTOMY FUSION N/A 05/23/2019   Procedure: ANTERIOR CERVICAL DISCECTOMY FUSION CERVICAL FIVE THROUGH CERVICAL SIX AND CERVICAL SIX THROUGH CERVICAL SEVEN;  Surgeon: Lucilla Lynwood BRAVO, MD;  Location: [facility] OR;  Service: Orthopedics;  Laterality: N/A;   BRONCHIAL BRUSHINGS  04/28/2022   Procedure: BRONCHIAL BRUSHINGS;  Surgeon: Shelah Lamar RAMAN, MD;  Location: [facility] ENDOSCOPY;  Service: Pulmonary;;   BRONCHIAL NEEDLE ASPIRATION BIOPSY  04/28/2022   Procedure: BRONCHIAL NEEDLE ASPIRATION BIOPSIES;  Surgeon: Shelah Lamar RAMAN, MD;  Location: [facility] ENDOSCOPY;  Service: Pulmonary;;   COLONOSCOPY     CYSTOSCOPY     Tannebaum   FRACTURE SURGERY     KNEE ARTHROSCOPY WITH MEDIAL MENISECTOMY Left 01/02/2017   Procedure: LEFT KNEE ARTHROSCOPY WITH PARTIAL MEDIAL MENISCECTOMY;  Surgeon: Vernetta Lonni GRADE, MD;  Location: [facility] ORS;  Service: Orthopedics;  Laterality: Left;   POSTERIOR CERVICAL FUSION/FORAMINOTOMY N/A 08/20/2020   Procedure: LEFT CERVICAL SIX THROUGH SEVEN  AND CERVICAL SEVEN THROUGH THORACIC ONE  FORAMINOTOMIES WITH EXCISION OF  DISC HERNIATION LEFT CERVICAL SEVEN THROUGH THORACIC ONE;  Surgeon: Lucilla Lynwood BRAVO, MD;  Location: [facility] OR;  Service: Orthopedics;  Laterality: N/A;   SPINE SURGERY     TONSILLECTOMY     TURBINATE RESECTION  2007   UVULOPALATOPHARYNGOPLASTY     VIDEO BRONCHOSCOPY WITH RADIAL ENDOBRONCHIAL ULTRASOUND  04/28/2022   Procedure: VIDEO BRONCHOSCOPY WITH RADIAL ENDOBRONCHIAL ULTRASOUND;  Surgeon: Shelah Lamar RAMAN, MD;  Location: [facility] ENDOSCOPY;  Service: Pulmonary;;   Patient Active Problem List   Diagnosis Date Noted   COPD exacerbation (HCC) 04/26/2024   Hymenoptera reaction 03/25/2024   COVID-19 02/22/2024   Acute on chronic diastolic heart failure (HCC) 09/30/2023   Diabetes mellitus (HCC) 08/31/2023   Rash 08/31/2023   Cognitive changes 08/31/2023   Unilateral primary osteoarthritis, left knee 02/24/2022   Unilateral  primary osteoarthritis, right knee 02/24/2022   Pulmonary nodules 01/28/2022   History of adenomatous polyp of colon 01/08/2022   Hypokalemia 09/26/2021   Dyspnea 09/25/2021   Acute deep vein thrombosis (DVT) of right lower extremity (HCC) 09/24/2021   OSA (obstructive sleep apnea) 09/24/2021   Herniation of cervical intervertebral disc with radiculopathy    Status post cervical discectomy 08/20/2020   Tracheomalacia 05/26/2019   Acquired tracheomalacia 05/26/2019   HNP (herniated nucleus pulposus) with myelopathy, cervical 05/23/2019    Class: Chronic   Spinal stenosis of cervical region 05/23/2019    Class: Chronic   Status post cervical spinal fusion 05/23/2019   Chronic respiratory failure with hypoxia (HCC) 04/12/2018   Bilateral lower extremity edema 12/29/2017   Hymenoptera allergy 11/10/2017   Family history of colonic polyps 08/07/2017   Diverticulosis 08/07/2017   Flatus 08/07/2017   Myofascial pain 08/06/2017   Seasonal and perennial allergic rhinitis 04/23/2017   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/2017   Cervicalgia 04/01/2017   Recurrent pneumonia 03/09/2017   Other spondylosis with radiculopathy, cervical region 02/09/2017   Depression with anxiety 01/04/2017   Memory difficulty 12/05/2016   Diarrhea in adult patient 12/03/2016   Internal hemorrhoids 12/03/2016   Chronic migraine without aura without status migrainosus, not intractable 10/30/2016   Lumbar radiculopathy 05/15/2016   Osteoarthritis of spine with radiculopathy, lumbar region 05/15/2016   Risk for falls 05/15/2016   Recurrent infections 03/29/2016   Chronic nonseasonal allergic rhinitis due to pollen 03/29/2016   Polypharmacy 01/16/2016   Morbid obesity with BMI of 45.0-49.9, adult (HCC) 03/06/2015   SDAT 02/05/2015   OSA and COPD overlap syndrome (HCC) 02/05/2015   Medication management 08/02/2014   GERD (gastroesophageal reflux disease) 05/09/2014   Vitamin D  deficiency 08/01/2013   Positive TB test  07/29/2011   Diverticula of colon 05/07/2011   Hypertension 01/31/2011   Hyperlipidemia, mixed 01/31/2011   BPH (benign prostatic hyperplasia) 01/31/2011   Testosterone  Deficiency 01/31/2011   IBS (irritable bowel syndrome) 01/31/2011   Partial complex seizure disorder with intractable epilepsy (HCC) 01/31/2011   Depression, major, recurrent, in partial remission (HCC) 01/31/2011   Asthma-COPD overlap syndrome (HCC) 01/31/2011    PCP: Quintin Crane MD  REFERRING PROVIDER: Vernetta Lonni GRADE, MD   REFERRING DIAG:  [phone] (ICD-10-CM) - Chronic pain of left knee  M25.561,G89.29 (ICD-10-CM) - Chronic pain of right knee    THERAPY DIAG:  Bilateral chronic knee pain  Muscle weakness (generalized)  Other abnormalities of gait and mobility  Rationale for Evaluation and Treatment: Rehabilitation  ONSET DATE: >5 yrs  SUBJECTIVE:   SUBJECTIVE STATEMENT: Pt reports he received cortisone shots in both knees last week; helped a lot. He states  that he will be getting gel injections again next year. Overall, he believes the PT has been helpful for his mobility and flexibility.  Initial Subjective OA bilateral knees.  Not a surgical candidate at this time due to elevated BMI. Received steroid injections 01/18/24. Twisted my R knee the other day have basically stayed off of it. R knee hurting > left today usually the other way around. TBI 1985 with left sided le weakness; Had a fall in 2019 hurt my neck was paralyzed waist down resolved on its own, then had a cervical fusion 2020.  Pt reports he does not do a lot of walking as he feels unsafe.   PERTINENT HISTORY: 2020: cervical decompression discectomy/fusion Acute on chronic diastolic heart failure Closed head injury 1985 with residual left side weakness (left leg drags) PAIN:  Are you having pain? Yes: NPRS scale: see below Pain location: knees 4/10, neck 6-7/10 Pain description: achy; sharp Aggravating factors:  walking; standing Relieving factors: sitting; elevation; ice  PRECAUTIONS: None  RED FLAGS: None   WEIGHT BEARING RESTRICTIONS: No  FALLS:  Has patient fallen in last 6 months? 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  DIAGNOSTIC FINDINGS:  L knee x-ray 2023: 2 views of the left knee show tricompartment arthritis mainly involving  the medial compartment of the knee.  This is moderate arthritis.   R knee x-ray 2023 2 views of the right knee show no significant malalignment.  There is mild  tricompartment arthritic changes.   PATIENT SURVEYS:  Lefs: 22/80 04/29/24:21/80  COGNITION: Overall cognitive status: Within functional limits for tasks assessed     SENSATION: WFL  EDEMA:  Minimal right knee  MUSCLE LENGTH: Hamstrings: tight bilaterally tested in sitting  POSTURE: rounded shoulders, forward head, and slight knee varus bilaterally  PALPATION: TTP medial joint line left knee  LOWER EXTREMITY ROM:  Active ROM Right eval Left eval Right 04/29/24  Hip flexion     Hip extension     Hip abduction     Hip adduction     Hip internal rotation     Hip external rotation     Knee flexion 105 130 130  Knee extension 0 0 0  Ankle dorsiflexion     Ankle plantarflexion     Ankle inversion     Ankle eversion      (Blank rows = not tested)  LOWER EXTREMITY MMT:  MMT Right eval Left eval  Hip flexion 20.5 32.9  Hip extension    Hip abduction    Hip adduction    Hip internal rotation    Hip  external rotation    Knee flexion 4/5 53.0  Knee extension 5/5 33.8  Ankle dorsiflexion 5 5  Ankle plantarflexion    Ankle inversion    Ankle eversion     (Blank rows = not tested)  FUNCTIONAL TESTS:  Timed up and go (TUG): 28.67  4 stage balance: 04/29/24: 18.58 sec with SPC  GAIT: Distance walked: 400 ft Assistive device utilized: Single point cane Level of assistance: Complete Independence Comments:  Pt pauses upon standing then initiates gait. Knee flex                                                                                                                                 TREATMENT  [facility] Adult PT Treatment:                                                DATE: 04/29/24 Pt seen for aquatic therapy today.  Treatment took place in water 3.5-4.75 ft in depth at the [facility] pool. Temp of water was 91.  Pt entered/exited the pool via stairs using step to pattern with hand rail.  * LEFS, knee ROM assessment, TUG (see above) * walking forward/ backward with reciprocal row with green resistance bells * side stepping with arm add/abdct with green resistance bells * forward/backward marching with reciprocal row with green resistance bells *forward walking with kick  *UE support on yellow hand floats heel/toe raises x 15; hip add/abdct x10; alternating hamstring curls  x 10 each - rest period *supine suspension white noodle under neck: hip add/abd; hip extension * TrA set with bil yellow hand float pull downs to thighs in wide stance; solid noodle pull down to thighs x 10 staggered stance each LE forward  *wrapped posteriorly under arms: cycling  Pt requires the buoyancy and hydrostatic pressure of water for support, and to offload joints by unweighting joint load by at least 50 % in navel deep water and by at least 75-80% in chest to neck deep water.  Viscosity of the water is needed for resistance of strengthening. Water current perturbations provides challenge to  standing balance requiring increased core activation.       PATIENT EDUCATION:  Education details: intro to aquatic therapy   Person educated: Patient Education method: Programmer, Multimedia, demo,  Education comprehension: verbalized understanding, return demo  HOME EXERCISE PROGRAM: tba  ASSESSMENT:  CLINICAL IMPRESSION: It has been 1 month since patient's last session due to scheduling difficulties.  He reports reduction in knee pain since recent injections.  Pt has reported some improvements in mobility since starting therapy.  His TUG time improved by 10 sec. Pt reported good toleration to exercises performed in water today. Pt has met STG3 and LTG 4.  Therapist to assess remaining goals next visit for recert.     Initial Impression Patient is a 63 y.o. m who was seen today for physical therapy evaluation and treatment for bilateral knee oa. Pt has a long Pmx including cervical spine dysfunction, TBI and heart failure.  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 He presents with pain limited deficits in bilat knees and throughout spine affecting ROM, endurance, activity tolerance, gait, balance, and functional mobility with ADL's. Patient modifying  and restricting ADL's as indicated by subjective information, objective measures and outcome measure score.  He will benefit from skilled PT intervention to improve all areas of deficits. Will see initially in aquatics then transition onto land when approp.   OBJECTIVE IMPAIRMENTS: Abnormal gait, decreased activity tolerance, decreased balance, decreased knowledge of use of DME, decreased mobility, difficulty walking, decreased ROM, decreased strength, obesity, and pain.   ACTIVITY LIMITATIONS: carrying, lifting, sitting, squatting, stairs, transfers, and locomotion level  PARTICIPATION LIMITATIONS: meal prep, cleaning, laundry, shopping, community activity, and yard work  PERSONAL FACTORS: Fitness and 3+  comorbidities: see PmHx are also affecting patient's functional outcome.   REHAB POTENTIAL: Good  CLINICAL DECISION MAKING: Stable/uncomplicated  EVALUATION COMPLEXITY: Low   GOALS: Goals reviewed with patient? No  SHORT TERM GOALS: Target date: 03/26/24 Pt will tolerate full aquatic sessions consistently without increase in pain and with improving function to demonstrate good toleration and effectiveness of intervention.  Baseline: Goal status: Met 03/30/24  2.  Pt will tolerate stair climbing using alternating or combination of alternating and step to pattern ascending and descending 6 steps with use of handrail  Baseline:  Goal status: Met 03/30/24  3.  Pt will consider gaining pool access for use of the properties of water for chronic conditions maintaining mobility and minimizing pain. Baseline: has [employer]; hasn't been there yet Goal status: MET - 04/29/24    LONG TERM GOALS: Target date: 04/29/24  Pt to improve on LEFS by at least 9 point to demonstrate statistically significant Improvement in function. Baseline: see above - 04/29/24 Goal status: In progress   2. Pt will tolerate walking to and from setting and engaging in aquatic therapy session without excessive fatigue or increase in pain to demonstrate improved toleration to activity.  Baseline: improving; varies day to day Goal status:in progress - 04/29/24  3.  Pt will improve strength in RLE within 5 lbs of left to demonstrate improved overall physical function Baseline:  Goal status: INITIAL  4.  Pt will improve ROM of  R knee flex to within 5d of left to improve function, decrease risk of injury and pain and improve quality of gait Baseline: See chart Goal status: MET- 04/29/24  5.  Pt will be indep with final HEP's (land and aquatic as appropriate) for continued management of condition Baseline:  Goal status: INITIAL     PLAN:  PT FREQUENCY: 1-2x/week  PT DURATION: 10 weeks delayed start due to  scheduling needs of pt  PLANNED INTERVENTIONS: [id number]- PT Re-evaluation, 97750- Physical Performance Testing, 97110-Therapeutic exercises, 97530- Therapeutic activity, 97112- Neuromuscular re-education, 97535- Self Care, [id number]- Manual therapy, [id number]- Gait training, [phone]- Aquatic Therapy, [phone]- Ionotophoresis 4mg /ml Dexamethasone , 79439 (1-2 muscles), 20561 (3+ muscles)- Dry Needling, Patient/Family education, Balance training, Stair training, Taping, Joint mobilization, DME instructions, Cryotherapy, and Moist heat  PLAN FOR NEXT SESSION: Aquatic: general strengthening with focus on knees and core; balance and proprioceptive retraining; stair climbing Land same as [employer].  Delon Aquas, PTA 04/29/24 4:11 PM [facility] [address] [place], [place], [address] Phone: [phone]   Fax:  [phone]

## 2024-05-03 ENCOUNTER — Encounter: Payer: Self-pay | Admitting: Podiatry

## 2024-05-03 ENCOUNTER — Ambulatory Visit: Admitting: Podiatry

## 2024-05-03 DIAGNOSIS — B351 Tinea unguium: Secondary | ICD-10-CM

## 2024-05-03 DIAGNOSIS — M79676 Pain in unspecified toe(s): Secondary | ICD-10-CM | POA: Diagnosis not present

## 2024-05-03 DIAGNOSIS — M7751 Other enthesopathy of right foot: Secondary | ICD-10-CM | POA: Diagnosis not present

## 2024-05-03 DIAGNOSIS — D2372 Other benign neoplasm of skin of left lower limb, including hip: Secondary | ICD-10-CM | POA: Diagnosis not present

## 2024-05-03 DIAGNOSIS — D2371 Other benign neoplasm of skin of right lower limb, including hip: Secondary | ICD-10-CM | POA: Diagnosis not present

## 2024-05-03 DIAGNOSIS — M722 Plantar fascial fibromatosis: Secondary | ICD-10-CM | POA: Diagnosis not present

## 2024-05-03 DIAGNOSIS — M7752 Other enthesopathy of left foot: Secondary | ICD-10-CM

## 2024-05-03 DIAGNOSIS — R350 Frequency of micturition: Secondary | ICD-10-CM | POA: Diagnosis not present

## 2024-05-03 MED ORDER — TRIAMCINOLONE ACETONIDE 40 MG/ML IJ SUSP
40.0000 mg | Freq: Once | INTRAMUSCULAR | Status: AC
  Administered 2024-05-03: 40 mg

## 2024-05-03 MED ORDER — DEXAMETHASONE SODIUM PHOSPHATE 120 MG/30ML IJ SOLN
4.0000 mg | Freq: Once | INTRAMUSCULAR | Status: AC
  Administered 2024-05-03: 4 mg via INTRA_ARTICULAR

## 2024-05-04 ENCOUNTER — Ambulatory Visit (HOSPITAL_BASED_OUTPATIENT_CLINIC_OR_DEPARTMENT_OTHER): Admitting: Physical Therapy

## 2024-05-04 ENCOUNTER — Encounter (HOSPITAL_BASED_OUTPATIENT_CLINIC_OR_DEPARTMENT_OTHER): Payer: Self-pay | Admitting: Physical Therapy

## 2024-05-04 DIAGNOSIS — R2689 Other abnormalities of gait and mobility: Secondary | ICD-10-CM

## 2024-05-04 DIAGNOSIS — G8929 Other chronic pain: Secondary | ICD-10-CM

## 2024-05-04 DIAGNOSIS — M6281 Muscle weakness (generalized): Secondary | ICD-10-CM

## 2024-05-04 DIAGNOSIS — M25561 Pain in right knee: Secondary | ICD-10-CM | POA: Diagnosis not present

## 2024-05-04 NOTE — Progress Notes (Signed)
 He presents today for follow-up of his painful feet.  He states that he is here for diabetic check nail trimming and his heels have been hurting again.  He states that this area over here is painful as he points to the plantar aspect of the subfifth metatarsal head left foot.  Objective: Vital signs are stable oriented x 3.  Pulses are palpable.  Toenails are long and dystrophic sharply incurvated.  Diabetic peripheral neuropathy right over left.  Benign hyperkeratotic neoplasm subfifth met head left.  Painful area of fluctuance consistent with bursitis in the same area.  Mild surrounding erythema but no open lesions.  Also has pain palpation medial calcaneal tubercles bilateral.  Assessment: Plantar fasciitis bilateral diabetes mellitus with diabetic peripheral neuropathy.  Bursitis benign skin lesions and painful mycotic nails.  Plan: Debrided nails 1 through 5 bilaterally injected the bilateral heels today 10 mg Kenalog  5 mg Marcaine  point maximal tenderness.  Injected 2 mg of dexamethasone  into the bursa today debrided benign skin lesion.  Follow-up with him in 3 months.

## 2024-05-04 NOTE — Therapy (Signed)
 OUTPATIENT PHYSICAL THERAPY LOWER EXTREMITY TREATMENT Progress Note- Re-cert Reporting Period 02/16/24 to 05/04/24  See note below for Objective Data and Assessment of Progress/Goals.       Patient Name: Mark Harrell. MRN: [id number] DOB:May 13, 1961, 63 y.o., male Today's Date: 05/04/2024  END OF SESSION:  PT End of Session - 05/04/24 1511     Visit Number 7    Date for Recertification  06/24/24    Authorization Type [employer]    PT Start Time 1501    PT Stop Time 1540    PT Time Calculation (min) 39 min    Activity Tolerance Patient tolerated treatment well    Behavior During Therapy WFL for tasks assessed/performed           Past Medical History:  Diagnosis Date   Allergy    Anesthesia complication requiring reversal agent administration    ? from central apnea, very difficult to get off vent   Anxiety    Arthritis    osteo   Asthma    BPH (benign prostatic hyperplasia)    Cataract    CHF (congestive heart failure) (HCC)    Complication of anesthesia    difficulty waking , they twlight me because of my respiratory problems    COPD (chronic obstructive pulmonary disease) (HCC)    COVID    moderate case 2023   Depression    Diabetes mellitus (HCC) 08/31/2023   DVT (deep venous thrombosis) (HCC) 2023   right leg   Dyspnea    on exertion   Enlarged heart    Family history of adverse reaction to anesthesia    mother trouble waking up, and heart stopped   GERD (gastroesophageal reflux disease)    Headache    botox  injections for headaches   Hyperlipidemia    Hypertension    Hypogonadism male    IBS (irritable bowel syndrome)    Kidney stones    Memory difficulties    short term memories   Neuromuscular disorder (HCC)    Neuropathy    Obesity    On home oxygen  therapy    on 2 liter   OSA (obstructive sleep apnea)    not using cpap   Oxygen  deficiency    Paralysis (HCC)    left hand - small    due to accident with an head injury   Pneumonia     Pre-diabetes    Prostatitis    Sleep apnea    Ulcer    Past Surgical History:  Procedure Laterality Date   ABDOMINAL SURGERY     ANKLE FRACTURE SURGERY Right    ANTERIOR CERVICAL DECOMP/DISCECTOMY FUSION N/A 05/23/2019   Procedure: ANTERIOR CERVICAL DISCECTOMY FUSION CERVICAL FIVE THROUGH CERVICAL SIX AND CERVICAL SIX THROUGH CERVICAL SEVEN;  Surgeon: Lucilla Lynwood BRAVO, MD;  Location: [facility] OR;  Service: Orthopedics;  Laterality: N/A;   BRONCHIAL BRUSHINGS  04/28/2022   Procedure: BRONCHIAL BRUSHINGS;  Surgeon: Shelah Lamar RAMAN, MD;  Location: [facility] ENDOSCOPY;  Service: Pulmonary;;   BRONCHIAL NEEDLE ASPIRATION BIOPSY  04/28/2022   Procedure: BRONCHIAL NEEDLE ASPIRATION BIOPSIES;  Surgeon: Shelah Lamar RAMAN, MD;  Location: [facility] ENDOSCOPY;  Service: Pulmonary;;   COLONOSCOPY     CYSTOSCOPY     Tannebaum   FRACTURE SURGERY     KNEE ARTHROSCOPY WITH MEDIAL MENISECTOMY Left 01/02/2017   Procedure: LEFT KNEE ARTHROSCOPY WITH PARTIAL MEDIAL MENISCECTOMY;  Surgeon: Vernetta Lonni GRADE, MD;  Location: [facility] ORS;  Service: Orthopedics;  Laterality: Left;   POSTERIOR CERVICAL  FUSION/FORAMINOTOMY N/A 08/20/2020   Procedure: LEFT CERVICAL SIX THROUGH SEVEN  AND CERVICAL SEVEN THROUGH THORACIC ONE  FORAMINOTOMIES WITH EXCISION OF DISC HERNIATION LEFT CERVICAL SEVEN THROUGH THORACIC ONE;  Surgeon: Lucilla Lynwood BRAVO, MD;  Location: [facility] OR;  Service: Orthopedics;  Laterality: N/A;   SPINE SURGERY     TONSILLECTOMY     TURBINATE RESECTION  2007   UVULOPALATOPHARYNGOPLASTY     VIDEO BRONCHOSCOPY WITH RADIAL ENDOBRONCHIAL ULTRASOUND  04/28/2022   Procedure: VIDEO BRONCHOSCOPY WITH RADIAL ENDOBRONCHIAL ULTRASOUND;  Surgeon: Shelah Lamar RAMAN, MD;  Location: [facility] ENDOSCOPY;  Service: Pulmonary;;   Patient Active Problem List   Diagnosis Date Noted   COPD exacerbation (HCC) 04/26/2024   Hymenoptera reaction 03/25/2024   COVID-19 02/22/2024   Acute on chronic diastolic heart failure (HCC) 09/30/2023   Diabetes mellitus  (HCC) 08/31/2023   Rash 08/31/2023   Cognitive changes 08/31/2023   Unilateral primary osteoarthritis, left knee 02/24/2022   Unilateral primary osteoarthritis, right knee 02/24/2022   Pulmonary nodules 01/28/2022   History of adenomatous polyp of colon 01/08/2022   Hypokalemia 09/26/2021   Dyspnea 09/25/2021   Acute deep vein thrombosis (DVT) of right lower extremity (HCC) 09/24/2021   OSA (obstructive sleep apnea) 09/24/2021   Herniation of cervical intervertebral disc with radiculopathy    Status post cervical discectomy 08/20/2020   Tracheomalacia 05/26/2019   Acquired tracheomalacia 05/26/2019   HNP (herniated nucleus pulposus) with myelopathy, cervical 05/23/2019    Class: Chronic   Spinal stenosis of cervical region 05/23/2019    Class: Chronic   Status post cervical spinal fusion 05/23/2019   Chronic respiratory failure with hypoxia (HCC) 04/12/2018   Bilateral lower extremity edema 12/29/2017   Hymenoptera allergy 11/10/2017   Family history of colonic polyps 08/07/2017   Diverticulosis 08/07/2017   Flatus 08/07/2017   Myofascial pain 08/06/2017   Seasonal and perennial allergic rhinitis 04/23/2017   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/2017   Cervicalgia 04/01/2017   Recurrent pneumonia 03/09/2017   Other spondylosis with radiculopathy, cervical region 02/09/2017   Depression with anxiety 01/04/2017   Memory difficulty 12/05/2016   Diarrhea in adult patient 12/03/2016   Internal hemorrhoids 12/03/2016   Chronic migraine without aura without status migrainosus, not intractable 10/30/2016   Lumbar radiculopathy 05/15/2016   Osteoarthritis of spine with radiculopathy, lumbar region 05/15/2016   Risk for falls 05/15/2016   Recurrent infections 03/29/2016   Chronic nonseasonal allergic rhinitis due to pollen 03/29/2016   Polypharmacy 01/16/2016   Morbid obesity with BMI of 45.0-49.9, adult (HCC) 03/06/2015   SDAT 02/05/2015   OSA and COPD overlap syndrome (HCC) 02/05/2015    Medication management 08/02/2014   GERD (gastroesophageal reflux disease) 05/09/2014   Vitamin D  deficiency 08/01/2013   Positive TB test 07/29/2011   Diverticula of colon 05/07/2011   Hypertension 01/31/2011   Hyperlipidemia, mixed 01/31/2011   BPH (benign prostatic hyperplasia) 01/31/2011   Testosterone  Deficiency 01/31/2011   IBS (irritable bowel syndrome) 01/31/2011   Partial complex seizure disorder with intractable epilepsy (HCC) 01/31/2011   Depression, major, recurrent, in partial remission (HCC) 01/31/2011   Asthma-COPD overlap syndrome (HCC) 01/31/2011    PCP: Quintin Crane MD  REFERRING PROVIDER: Vernetta Lonni GRADE, MD   REFERRING DIAG:  [phone] (ICD-10-CM) - Chronic pain of left knee  M25.561,G89.29 (ICD-10-CM) - Chronic pain of right knee    THERAPY DIAG:  Bilateral chronic knee pain  Muscle weakness (generalized)  Other abnormalities of gait and mobility  Rationale for Evaluation and Treatment: Rehabilitation  ONSET DATE: >5  yrs  SUBJECTIVE:   SUBJECTIVE STATEMENT: Pt reports he received cortisone shots 2 weeks ago with continued pain relief. Pain today 4/10  Initial Subjective OA bilateral knees.  Not a surgical candidate at this time due to elevated BMI. Received steroid injections 01/18/24. Twisted my R knee the other day have basically stayed off of it. R knee hurting > left today usually the other way around. TBI 1985 with left sided le weakness; Had a fall in 2019 hurt my neck was paralyzed waist down resolved on its own, then had a cervical fusion 2020.  Pt reports he does not do a lot of walking as he feels unsafe.   PERTINENT HISTORY: 2020: cervical decompression discectomy/fusion Acute on chronic diastolic heart failure Closed head injury 1985 with residual left side weakness (left leg drags) PAIN:  Are you having pain? Yes: NPRS scale: see below Pain location: knees 4/10, neck 6-7/10 Pain description: achy; sharp Aggravating  factors: walking; standing Relieving factors: sitting; elevation; ice  PRECAUTIONS: None  RED FLAGS: None   WEIGHT BEARING RESTRICTIONS: No  FALLS:  Has patient fallen in last 6 months? 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  DIAGNOSTIC FINDINGS:  L knee x-ray 2023: 2 views of the left knee show tricompartment arthritis mainly involving  the medial compartment of the knee.  This is moderate arthritis.   R knee x-ray 2023 2 views of the right knee show no significant malalignment.  There is mild  tricompartment arthritic changes.   PATIENT SURVEYS:  Lefs: 22/80 04/29/24:21/80  COGNITION: Overall cognitive status: Within functional limits for tasks assessed     SENSATION: WFL  EDEMA:  Minimal right knee  MUSCLE LENGTH: Hamstrings: tight bilaterally tested in sitting  POSTURE: rounded shoulders, forward head, and slight knee varus bilaterally  PALPATION: TTP medial joint line left knee  LOWER EXTREMITY ROM:  Active ROM Right eval Left eval Right 04/29/24  Hip flexion     Hip extension     Hip abduction     Hip adduction     Hip internal rotation     Hip external rotation     Knee flexion 105 130 130  Knee extension 0 0 0  Ankle dorsiflexion     Ankle plantarflexion     Ankle inversion     Ankle eversion      (Blank rows = not tested)  LOWER EXTREMITY MMT:  MMT Right eval Left eval Right / L 05/04/24  Hip flexion 20.5 32.9 34.5  Hip extension     Hip abduction     Hip  adduction     Hip internal rotation     Hip external rotation     Knee flexion 4/5 53.0   Knee extension 5/5 33.8 43.2 / 36.4  Ankle dorsiflexion 5 5   Ankle plantarflexion     Ankle inversion     Ankle eversion      (Blank rows = not tested)  FUNCTIONAL TESTS:  Timed up and go (TUG): 28.67  4 stage balance: Passed 1&2.  Tandem and  SLS unable 04/29/24: 18.58 sec with SPC  GAIT: Distance walked: 400 ft Assistive device utilized: Single point cane Level of assistance: Complete Independence Comments: Pt pauses upon standing then initiates gait. Knee flex                                                                                                                                 TREATMENT  [facility] Adult PT Treatment:                                                DATE: 05/04/24 Pt seen for aquatic therapy today.  Treatment took place in water 3.5-4.75 ft in depth at the [facility] pool. Temp of water was 91.  Pt entered/exited the pool via stairs using step to pattern with hand rail.  * walking forward/ backward /side with reciprocal arm swing * side stepping blue HB add/abd *UE support on yellow hand floats heel/toe raises x 15; hip add/abdct x10; alternating hamstring curls  x 10 each *staggered stance ue horizontal add/abd; Bow & arrow  vc for weight shift backward (good weight shift and balance challenge *forward and backward march with kick  *wrapped posteriorly under arms: cycling  Strength testing completed  Pt requires the buoyancy and hydrostatic pressure of water for support, and to offload joints by unweighting joint load by at least 50 % in navel deep water and by at least 75-80% in chest to neck deep water.  Viscosity of the water is needed for resistance of strengthening. Water current perturbations provides challenge to standing balance requiring increased core activation.       PATIENT EDUCATION:  Education details: intro to aquatic therapy   Person  educated: Patient Education method: Programmer, Multimedia, demo,  Education comprehension: verbalized understanding, return demo  HOME EXERCISE PROGRAM: tba  ASSESSMENT:  CLINICAL IMPRESSION: PN: see note below. Testing demonstrates improved right hip strength meeting LTG.  He continues to have reduced pain sensitivity since injections reporting increased ease with functional mobility. He will continue to benefit from skilled PT aquatic intervention to progress towards previous and new stated goals. May consider adding land based intervention going forward.  Last session: It has been 1 month since patient's last session due to scheduling difficulties.  He reports reduction in knee pain since recent injections.  Pt has reported some improvements in mobility since starting therapy.  His TUG time improved by 10 sec. Pt reported good toleration to exercises performed in water today. Pt has met STG3 and LTG 4.  Therapist to assess remaining goals next visit for recert.    OBJECTIVE IMPAIRMENTS: Abnormal gait, decreased activity tolerance, decreased balance, decreased knowledge of use of DME, decreased mobility, difficulty walking, decreased ROM, decreased strength, obesity, and pain.   ACTIVITY LIMITATIONS: carrying, lifting, sitting, squatting, stairs, transfers, and  locomotion level  PARTICIPATION LIMITATIONS: meal prep, cleaning, laundry, shopping, community activity, and yard work  PERSONAL FACTORS: Fitness and 3+ comorbidities: see PmHx are also affecting patient's functional outcome.   REHAB POTENTIAL: Good  CLINICAL DECISION MAKING: Stable/uncomplicated  EVALUATION COMPLEXITY: Low   GOALS: Goals reviewed with patient? No  SHORT TERM GOALS: Target date: 03/26/24 Pt will tolerate full aquatic sessions consistently without increase in pain and with improving function to demonstrate good toleration and effectiveness of intervention.  Baseline: Goal status: Met 03/30/24  2.  Pt will tolerate  stair climbing using alternating or combination of alternating and step to pattern ascending and descending 6 steps with use of handrail  Baseline:  Goal status: Met 03/30/24  3.  Pt will consider gaining pool access for use of the properties of water for chronic conditions maintaining mobility and minimizing pain. Baseline: has [employer]; hasn't been there yet Goal status: MET - 04/29/24    LONG TERM GOALS: Target date: 06/24/24  Pt to improve on LEFS by at least 9 point to demonstrate statistically significant Improvement in function. Baseline: see above - 04/29/24 Goal status: In progress   2. Pt will tolerate walking to and from setting and engaging in aquatic therapy session without excessive fatigue or increase in pain to demonstrate improved toleration to activity.  Baseline: improving; varies day to day Goal status:in progress - 04/29/24  3.  Pt will improve strength in RLE within 5 lbs of left to demonstrate improved overall physical function Baseline:  Goal status: Met 05/04/24  4.  Pt will improve ROM of  R knee flex to within 5d of left to improve function, decrease risk of injury and pain and improve quality of gait Baseline: See chart Goal status: MET- 04/29/24  5.  Pt will be indep with final HEP's (land and aquatic as appropriate) for continued management of condition Baseline:  Goal status: INITIAL   6. Pt will perform Tandem and SLS in 3.6 ft unsupported x 15s to demonstrate improved balance   Baseline: see above   Goals Status: NEW   7. Pt will perform 10 consecutive STS from water bench onto water step to demonstrate improved immediate standing balance and strength.   Baseline:   Goal Status: NEW   PLAN:  PT FREQUENCY: 1-2x/week  PT DURATION: 8 week  PLANNED INTERVENTIONS: 97164- PT Re-evaluation, 97750- Physical Performance Testing, 97110-Therapeutic exercises, 97530- Therapeutic activity, [id number]- Neuromuscular re-education, 97535- Self Care, [id number]-  Manual therapy, [phone]- Gait training, [phone]- Aquatic Therapy, [phone]- Ionotophoresis 4mg /ml Dexamethasone , 79439 (1-2 muscles), 20561 (3+ muscles)- Dry Needling, Patient/Family education, Balance training, Stair training, Taping, Joint mobilization, DME instructions, Cryotherapy, and Moist heat  PLAN FOR NEXT SESSION: Aquatic: general strengthening with focus on knees and core; balance and proprioceptive retraining; stair climbing Land same as [employer].  Ronal Umapine) Sangita Zani MPT 05/04/24 3:50 PM [facility] [address] [place], [place], [address] Phone: [phone]   Fax:  [phone]

## 2024-05-09 ENCOUNTER — Other Ambulatory Visit: Payer: Self-pay

## 2024-05-09 NOTE — Progress Notes (Signed)
 Specialty Pharmacy Ongoing Clinical Assessment Note  Mark Harrell. is a 63 y.o. male who is being followed by the specialty pharmacy service for RxSp Asthma/COPD   Patient's specialty medication(s) reviewed today: Dupilumab  (Dupixent )   Missed doses in the last 4 weeks: 0   Patient/Caregiver did not have any additional questions or concerns.   Therapeutic benefit summary: Patient is achieving benefit   Adverse events/side effects summary: No adverse events/side effects   Patient's therapy is appropriate to: Continue    Goals Addressed             This Visit's Progress    Minimize recurrence of flares   On track    Patient is on track. Patient will maintain adherence. Patient reports that he is well controlled on therapy and has not experienced any flares.          Follow up: 12 months  [facility]

## 2024-05-09 NOTE — Progress Notes (Signed)
 Specialty Pharmacy Refill Coordination Note  Mark Harrell. is a 63 y.o. male contacted today regarding refills of specialty medication(s) Dupilumab  (Dupixent )   Patient requested Delivery   Delivery date: 05/10/24   Verified address: [address] [place] [address]   Medication will be filled on: 05/09/24

## 2024-05-10 DIAGNOSIS — R350 Frequency of micturition: Secondary | ICD-10-CM | POA: Diagnosis not present

## 2024-05-12 ENCOUNTER — Ambulatory Visit (HOSPITAL_BASED_OUTPATIENT_CLINIC_OR_DEPARTMENT_OTHER): Admitting: Physical Therapy

## 2024-05-12 ENCOUNTER — Encounter (HOSPITAL_BASED_OUTPATIENT_CLINIC_OR_DEPARTMENT_OTHER): Payer: Self-pay | Admitting: Physical Therapy

## 2024-05-12 DIAGNOSIS — R2689 Other abnormalities of gait and mobility: Secondary | ICD-10-CM

## 2024-05-12 DIAGNOSIS — G8929 Other chronic pain: Secondary | ICD-10-CM

## 2024-05-12 DIAGNOSIS — M6281 Muscle weakness (generalized): Secondary | ICD-10-CM

## 2024-05-12 DIAGNOSIS — M25561 Pain in right knee: Secondary | ICD-10-CM | POA: Diagnosis not present

## 2024-05-12 NOTE — Therapy (Signed)
 OUTPATIENT PHYSICAL THERAPY LOWER EXTREMITY TREATMENT       Patient Name: Mark Harrell. MRN: [id number] DOB:July 10, 1960, 63 y.o., male Today's Date: 05/12/2024  END OF SESSION:  PT End of Session - 05/12/24 1455     Visit Number 8    Date for Recertification  06/24/24    Authorization Type BCBS    Authorization Time Period [place] [employer]  OOPM MET  Auth Req.  visit #3/5 visits approved  From 12.05.2025 - 02.02.2026  Tracking #[id number]  MB ([place])    Authorization - Visit Number 3    Authorization - Number of Visits 5    PT Start Time 1446    PT Stop Time 1525    PT Time Calculation (min) 39 min    Activity Tolerance Patient tolerated treatment well    Behavior During Therapy WFL for tasks assessed/performed            Past Medical History:  Diagnosis Date   Allergy    Anesthesia complication requiring reversal agent administration    ? from central apnea, very difficult to get off vent   Anxiety    Arthritis    osteo   Asthma    BPH (benign prostatic hyperplasia)    Cataract    CHF (congestive heart failure) (HCC)    Complication of anesthesia    difficulty waking , they twlight me because of my respiratory problems    COPD (chronic obstructive pulmonary disease) (HCC)    COVID    moderate case 2023   Depression    Diabetes mellitus (HCC) 08/31/2023   DVT (deep venous thrombosis) (HCC) 2023   right leg   Dyspnea    on exertion   Enlarged heart    Family history of adverse reaction to anesthesia    mother trouble waking up, and heart stopped   GERD (gastroesophageal reflux disease)    Headache    botox  injections for headaches   Hyperlipidemia    Hypertension    Hypogonadism male    IBS (irritable bowel syndrome)    Kidney stones    Memory difficulties    short term memories   Neuromuscular disorder (HCC)    Neuropathy    Obesity    On home oxygen  therapy    on 2 liter   OSA (obstructive sleep apnea)    not using cpap   Oxygen  deficiency     Paralysis (HCC)    left hand - small    due to accident with an head injury   Pneumonia    Pre-diabetes    Prostatitis    Sleep apnea    Ulcer    Past Surgical History:  Procedure Laterality Date   ABDOMINAL SURGERY     ANKLE FRACTURE SURGERY Right    ANTERIOR CERVICAL DECOMP/DISCECTOMY FUSION N/A 05/23/2019   Procedure: ANTERIOR CERVICAL DISCECTOMY FUSION CERVICAL FIVE THROUGH CERVICAL SIX AND CERVICAL SIX THROUGH CERVICAL SEVEN;  Surgeon: Lucilla Lynwood BRAVO, MD;  Location: [facility] OR;  Service: Orthopedics;  Laterality: N/A;   BRONCHIAL BRUSHINGS  04/28/2022   Procedure: BRONCHIAL BRUSHINGS;  Surgeon: Shelah Lamar RAMAN, MD;  Location: [facility] ENDOSCOPY;  Service: Pulmonary;;   BRONCHIAL NEEDLE ASPIRATION BIOPSY  04/28/2022   Procedure: BRONCHIAL NEEDLE ASPIRATION BIOPSIES;  Surgeon: Shelah Lamar RAMAN, MD;  Location: [facility] ENDOSCOPY;  Service: Pulmonary;;   COLONOSCOPY     CYSTOSCOPY     Tannebaum   FRACTURE SURGERY     KNEE ARTHROSCOPY WITH MEDIAL MENISECTOMY Left 01/02/2017  Procedure: LEFT KNEE ARTHROSCOPY WITH PARTIAL MEDIAL MENISCECTOMY;  Surgeon: Vernetta Lonni GRADE, MD;  Location: [facility] ORS;  Service: Orthopedics;  Laterality: Left;   POSTERIOR CERVICAL FUSION/FORAMINOTOMY N/A 08/20/2020   Procedure: LEFT CERVICAL SIX THROUGH SEVEN  AND CERVICAL SEVEN THROUGH THORACIC ONE  FORAMINOTOMIES WITH EXCISION OF DISC HERNIATION LEFT CERVICAL SEVEN THROUGH THORACIC ONE;  Surgeon: Lucilla Lynwood BRAVO, MD;  Location: [facility] OR;  Service: Orthopedics;  Laterality: N/A;   SPINE SURGERY     TONSILLECTOMY     TURBINATE RESECTION  2007   UVULOPALATOPHARYNGOPLASTY     VIDEO BRONCHOSCOPY WITH RADIAL ENDOBRONCHIAL ULTRASOUND  04/28/2022   Procedure: VIDEO BRONCHOSCOPY WITH RADIAL ENDOBRONCHIAL ULTRASOUND;  Surgeon: Shelah Lamar RAMAN, MD;  Location: [facility] ENDOSCOPY;  Service: Pulmonary;;   Patient Active Problem List   Diagnosis Date Noted   COPD exacerbation (HCC) 04/26/2024   Hymenoptera reaction 03/25/2024   COVID-19  02/22/2024   Acute on chronic diastolic heart failure (HCC) 09/30/2023   Diabetes mellitus (HCC) 08/31/2023   Rash 08/31/2023   Cognitive changes 08/31/2023   Unilateral primary osteoarthritis, left knee 02/24/2022   Unilateral primary osteoarthritis, right knee 02/24/2022   Pulmonary nodules 01/28/2022   History of adenomatous polyp of colon 01/08/2022   Hypokalemia 09/26/2021   Dyspnea 09/25/2021   Acute deep vein thrombosis (DVT) of right lower extremity (HCC) 09/24/2021   OSA (obstructive sleep apnea) 09/24/2021   Herniation of cervical intervertebral disc with radiculopathy    Status post cervical discectomy 08/20/2020   Tracheomalacia 05/26/2019   Acquired tracheomalacia 05/26/2019   HNP (herniated nucleus pulposus) with myelopathy, cervical 05/23/2019    Class: Chronic   Spinal stenosis of cervical region 05/23/2019    Class: Chronic   Status post cervical spinal fusion 05/23/2019   Chronic respiratory failure with hypoxia (HCC) 04/12/2018   Bilateral lower extremity edema 12/29/2017   Hymenoptera allergy 11/10/2017   Family history of colonic polyps 08/07/2017   Diverticulosis 08/07/2017   Flatus 08/07/2017   Myofascial pain 08/06/2017   Seasonal and perennial allergic rhinitis 04/23/2017   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/2017   Cervicalgia 04/01/2017   Recurrent pneumonia 03/09/2017   Other spondylosis with radiculopathy, cervical region 02/09/2017   Depression with anxiety 01/04/2017   Memory difficulty 12/05/2016   Diarrhea in adult patient 12/03/2016   Internal hemorrhoids 12/03/2016   Chronic migraine without aura without status migrainosus, not intractable 10/30/2016   Lumbar radiculopathy 05/15/2016   Osteoarthritis of spine with radiculopathy, lumbar region 05/15/2016   Risk for falls 05/15/2016   Recurrent infections 03/29/2016   Chronic nonseasonal allergic rhinitis due to pollen 03/29/2016   Polypharmacy 01/16/2016   Morbid obesity with BMI of 45.0-49.9,  adult (HCC) 03/06/2015   SDAT 02/05/2015   OSA and COPD overlap syndrome (HCC) 02/05/2015   Medication management 08/02/2014   GERD (gastroesophageal reflux disease) 05/09/2014   Vitamin D  deficiency 08/01/2013   Positive TB test 07/29/2011   Diverticula of colon 05/07/2011   Hypertension 01/31/2011   Hyperlipidemia, mixed 01/31/2011   BPH (benign prostatic hyperplasia) 01/31/2011   Testosterone  Deficiency 01/31/2011   IBS (irritable bowel syndrome) 01/31/2011   Partial complex seizure disorder with intractable epilepsy (HCC) 01/31/2011   Depression, major, recurrent, in partial remission (HCC) 01/31/2011   Asthma-COPD overlap syndrome (HCC) 01/31/2011    PCP: Quintin Crane MD  REFERRING PROVIDER: Vernetta Lonni GRADE, MD   REFERRING DIAG:  [id number] (ICD-10-CM) - Chronic pain of left knee  M25.561,G89.29 (ICD-10-CM) - Chronic pain of right knee    THERAPY  DIAG:  Bilateral chronic knee pain  Muscle weakness (generalized)  Other abnormalities of gait and mobility  Rationale for Evaluation and Treatment: Rehabilitation  ONSET DATE: >5 yrs  SUBJECTIVE:   SUBJECTIVE STATEMENT: Pt reports knee pain 5/10 today.  States he feels he is getting around more at home and tolerating more activity.  Initial Subjective OA bilateral knees.  Not a surgical candidate at this time due to elevated BMI. Received steroid injections 01/18/24. Twisted my R knee the other day have basically stayed off of it. R knee hurting > left today usually the other way around. TBI 1985 with left sided le weakness; Had a fall in 2019 hurt my neck was paralyzed waist down resolved on its own, then had a cervical fusion 2020.  Pt reports he does not do a lot of walking as he feels unsafe.   PERTINENT HISTORY: 2020: cervical decompression discectomy/fusion Acute on chronic diastolic heart failure Closed head injury 1985 with residual left side weakness (left leg drags) PAIN:  Are you having pain?  Yes: NPRS scale: see below Pain location: knees 4/10, neck 6-7/10 Pain description: achy; sharp Aggravating factors: walking; standing Relieving factors: sitting; elevation; ice  PRECAUTIONS: None  RED FLAGS: None   WEIGHT BEARING RESTRICTIONS: No  FALLS:  Has patient fallen in last 6 months? 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  DIAGNOSTIC FINDINGS:  L knee x-ray 2023: 2 views of the left knee show tricompartment arthritis mainly involving  the medial compartment of the knee.  This is moderate arthritis.   R knee x-ray 2023 2 views of the right knee show no significant malalignment.  There is mild  tricompartment arthritic changes.   PATIENT SURVEYS:  Lefs: 22/80 04/29/24:21/80  COGNITION: Overall cognitive status: Within functional limits for tasks assessed     SENSATION: WFL  EDEMA:  Minimal right knee  MUSCLE LENGTH: Hamstrings: tight bilaterally tested in sitting  POSTURE: rounded shoulders, forward head, and slight knee varus bilaterally  PALPATION: TTP medial joint line left knee  LOWER EXTREMITY ROM:  Active ROM Right eval Left eval Right 04/29/24  Hip flexion     Hip extension     Hip abduction     Hip adduction     Hip internal rotation     Hip external rotation     Knee flexion 105 130 130  Knee extension 0 0 0  Ankle dorsiflexion     Ankle plantarflexion     Ankle inversion     Ankle eversion      (Blank rows = not tested)  LOWER EXTREMITY MMT:  MMT Right eval  Left eval Right / L 05/04/24  Hip flexion 20.5 32.9 34.5  Hip extension     Hip abduction     Hip adduction     Hip internal rotation     Hip external rotation     Knee flexion 4/5 53.0   Knee extension 5/5 33.8 43.2 / 36.4  Ankle dorsiflexion 5 5   Ankle plantarflexion     Ankle inversion  Ankle eversion      (Blank rows = not tested)  FUNCTIONAL TESTS:  Timed up and go (TUG): 28.67  4 stage balance: Passed 1&2.  Tandem and SLS unable 04/29/24: 18.58 sec with SPC  GAIT: Distance walked: 400 ft Assistive device utilized: Single point cane Level of assistance: Complete Independence Comments: Pt pauses upon standing then initiates gait. Knee flex                                                                                                                                 TREATMENT  [facility] Adult PT Treatment:                                                DATE: 05/12/24 Pt seen for aquatic therapy today.  Treatment took place in water 3.5-4.75 ft in depth at the [facility] pool. Temp of water was 91.  Pt entered/exited the pool via stairs using step to pattern with hand rail.  * walking forward/ backward /side with reciprocal arm swing * side stepping blue HB add/abd *staggered stance ue horizontal add/abd; Bow & arrow (improved *forward and backward march with kick  *UE support on blue hand floats heel/toe raises x 15; hip add/abdct x10; alternating hamstring curls  x 10 each *tandem stance ue support blue HB leading R/L x 20s each 4.7 ft *SLS as above: unsteady but able to hold x 20 s *toe tapping bottom then 2nd step x 10 supported->unsupported     Pt requires the buoyancy and hydrostatic pressure of water for support, and to offload joints by unweighting joint load by at least 50 % in navel deep water and by at least 75-80% in chest to neck deep water.  Viscosity of the water is needed for resistance of strengthening. Water current perturbations provides  challenge to standing balance requiring increased core activation.       PATIENT EDUCATION:  Education details: intro to aquatic therapy   Person educated: Patient Education method: Programmer, Multimedia, demo,  Education comprehension: verbalized understanding, return demo  HOME EXERCISE PROGRAM: tba  ASSESSMENT:  CLINICAL IMPRESSION: Progressed balance challenges and worked knee proprioception with good tolerance.  Balance improving.  Pt reports improvement at home with ADL's and functional mobility.  Gals ongoing   PN: see note below. Testing demonstrates improved right hip strength meeting LTG.  He continues to have reduced pain sensitivity since injections reporting increased ease with functional mobility. He will continue to benefit from skilled PT aquatic intervention to progress towards previous and new stated goals. May consider adding land based intervention going forward.  Last session: It has been 1 month since patient's last session due to scheduling difficulties.  He reports reduction in knee pain since recent injections.  Pt has reported some improvements in mobility since starting  therapy.  His TUG time improved by 10 sec. Pt reported good toleration to exercises performed in water today. Pt has met STG3 and LTG 4.  Therapist to assess remaining goals next visit for recert.    OBJECTIVE IMPAIRMENTS: Abnormal gait, decreased activity tolerance, decreased balance, decreased knowledge of use of DME, decreased mobility, difficulty walking, decreased ROM, decreased strength, obesity, and pain.   ACTIVITY LIMITATIONS: carrying, lifting, sitting, squatting, stairs, transfers, and locomotion level  PARTICIPATION LIMITATIONS: meal prep, cleaning, laundry, shopping, community activity, and yard work  PERSONAL FACTORS: Fitness and 3+ comorbidities: see PmHx are also affecting patient's functional outcome.   REHAB POTENTIAL: Good  CLINICAL DECISION MAKING:  Stable/uncomplicated  EVALUATION COMPLEXITY: Low   GOALS: Goals reviewed with patient? No  SHORT TERM GOALS: Target date: 03/26/24 Pt will tolerate full aquatic sessions consistently without increase in pain and with improving function to demonstrate good toleration and effectiveness of intervention.  Baseline: Goal status: Met 03/30/24  2.  Pt will tolerate stair climbing using alternating or combination of alternating and step to pattern ascending and descending 6 steps with use of handrail  Baseline:  Goal status: Met 03/30/24  3.  Pt will consider gaining pool access for use of the properties of water for chronic conditions maintaining mobility and minimizing pain. Baseline: has [employer]; hasn't been there yet Goal status: MET - 04/29/24    LONG TERM GOALS: Target date: 06/24/24  Pt to improve on LEFS by at least 9 point to demonstrate statistically significant Improvement in function. Baseline: see above - 04/29/24 Goal status: In progress   2. Pt will tolerate walking to and from setting and engaging in aquatic therapy session without excessive fatigue or increase in pain to demonstrate improved toleration to activity.  Baseline: improving; varies day to day Goal status:in progress - 04/29/24  3.  Pt will improve strength in RLE within 5 lbs of left to demonstrate improved overall physical function Baseline:  Goal status: Met 05/04/24  4.  Pt will improve ROM of  R knee flex to within 5d of left to improve function, decrease risk of injury and pain and improve quality of gait Baseline: See chart Goal status: MET- 04/29/24  5.  Pt will be indep with final HEP's (land and aquatic as appropriate) for continued management of condition Baseline:  Goal status: INITIAL   6. Pt will perform Tandem and SLS in 3.6 ft unsupported x 15s to demonstrate improved balance   Baseline: see above   Goals Status: NEW   7. Pt will perform 10 consecutive STS from water bench onto  water step to demonstrate improved immediate standing balance and strength.   Baseline:   Goal Status: NEW   PLAN:  PT FREQUENCY: 1-2x/week  PT DURATION: 8 week  PLANNED INTERVENTIONS: [id number]- PT Re-evaluation, 97750- Physical Performance Testing, 97110-Therapeutic exercises, 97530- Therapeutic activity, [id number]- Neuromuscular re-education, 97535- Self Care, [id number]- Manual therapy, [phone]- Gait training, [phone]- Aquatic Therapy, [phone]- Ionotophoresis 4mg /ml Dexamethasone , 79439 (1-2 muscles), 20561 (3+ muscles)- Dry Needling, Patient/Family education, Balance training, Stair training, Taping, Joint mobilization, DME instructions, Cryotherapy, and Moist heat  PLAN FOR NEXT SESSION: Aquatic: general strengthening with focus on knees and core; balance and proprioceptive retraining; stair climbing Land same as [employer].  [address] [place]) Shakea Isip MPT 05/12/2024 3:29 PM [facility] [address] [place], [place], [address] Phone: [phone]   Fax:  [phone]

## 2024-05-15 ENCOUNTER — Other Ambulatory Visit: Payer: Self-pay | Admitting: Cardiovascular Disease

## 2024-05-15 DIAGNOSIS — I5033 Acute on chronic diastolic (congestive) heart failure: Secondary | ICD-10-CM

## 2024-05-18 ENCOUNTER — Encounter (HOSPITAL_BASED_OUTPATIENT_CLINIC_OR_DEPARTMENT_OTHER): Payer: Self-pay | Admitting: Physical Therapy

## 2024-05-18 ENCOUNTER — Ambulatory Visit (HOSPITAL_BASED_OUTPATIENT_CLINIC_OR_DEPARTMENT_OTHER): Admitting: Physical Therapy

## 2024-05-18 DIAGNOSIS — M6281 Muscle weakness (generalized): Secondary | ICD-10-CM

## 2024-05-18 DIAGNOSIS — R2689 Other abnormalities of gait and mobility: Secondary | ICD-10-CM

## 2024-05-18 DIAGNOSIS — G8929 Other chronic pain: Secondary | ICD-10-CM

## 2024-05-18 DIAGNOSIS — M25561 Pain in right knee: Secondary | ICD-10-CM | POA: Diagnosis not present

## 2024-05-18 NOTE — Therapy (Addendum)
 " OUTPATIENT PHYSICAL THERAPY LOWER EXTREMITY TREATMENT       Patient Name: Mark Harrell. MRN: [id number] DOB:1960-11-14, 63 y.o., male Today's Date: 05/18/2024  END OF SESSION:  PT End of Session - 05/18/24 1442     Visit Number 9    Date for Recertification  06/24/24    Authorization Type BCBS    Authorization Time Period  BCBS  OOPM MET  Auth Req.  visit #3/5 visits approved  From 12.05.2025 - 02.02.2026  Tracking #[id number]  MB ([employer])    Authorization - Visit Number 4    Authorization - Number of Visits 5    PT Start Time 1432    PT Stop Time 1510    PT Time Calculation (min) 38 min    Activity Tolerance Patient tolerated treatment well    Behavior During Therapy WFL for tasks assessed/performed            Past Medical History:  Diagnosis Date   Allergy    Anesthesia complication requiring reversal agent administration    ? from central apnea, very difficult to get off vent   Anxiety    Arthritis    osteo   Asthma    BPH (benign prostatic hyperplasia)    Cataract    CHF (congestive heart failure) (HCC)    Complication of anesthesia    difficulty waking , they twlight me because of my respiratory problems    COPD (chronic obstructive pulmonary disease) (HCC)    COVID    moderate case 2023   Depression    Diabetes mellitus (HCC) 08/31/2023   DVT (deep venous thrombosis) (HCC) 2023   right leg   Dyspnea    on exertion   Enlarged heart    Family history of adverse reaction to anesthesia    mother trouble waking up, and heart stopped   GERD (gastroesophageal reflux disease)    Headache    botox  injections for headaches   Hyperlipidemia    Hypertension    Hypogonadism male    IBS (irritable bowel syndrome)    Kidney stones    Memory difficulties    short term memories   Neuromuscular disorder (HCC)    Neuropathy    Obesity    On home oxygen  therapy    on 2 liter   OSA (obstructive sleep apnea)    not using cpap   Oxygen  deficiency     Paralysis (HCC)    left hand - small    due to accident with an head injury   Pneumonia    Pre-diabetes    Prostatitis    Sleep apnea    Ulcer    Past Surgical History:  Procedure Laterality Date   ABDOMINAL SURGERY     ANKLE FRACTURE SURGERY Right    ANTERIOR CERVICAL DECOMP/DISCECTOMY FUSION N/A 05/23/2019   Procedure: ANTERIOR CERVICAL DISCECTOMY FUSION CERVICAL FIVE THROUGH CERVICAL SIX AND CERVICAL SIX THROUGH CERVICAL SEVEN;  Surgeon: Lucilla Lynwood BRAVO, MD;  Location: [facility] OR;  Service: Orthopedics;  Laterality: N/A;   BRONCHIAL BRUSHINGS  04/28/2022   Procedure: BRONCHIAL BRUSHINGS;  Surgeon: Shelah Lamar RAMAN, MD;  Location[facility] ENDOSCOPY;  Service: Pulmonary;;   BRONCHIAL NEEDLE ASPIRATION BIOPSY  04/28/2022   Procedure: BRONCHIAL NEEDLE ASPIRATION BIOPSIES;  Surgeon: Shelah Lamar RAMAN, MD;  Location: [facility] ENDOSCOPY;  Service: Pulmonary;;   COLONOSCOPY     CYSTOSCOPY     Tannebaum   FRACTURE SURGERY     KNEE ARTHROSCOPY WITH MEDIAL MENISECTOMY Left 01/02/2017  Procedure: LEFT KNEE ARTHROSCOPY WITH PARTIAL MEDIAL MENISCECTOMY;  Surgeon: Vernetta Lonni GRADE, MD;  Location: [facility] ORS;  Service: Orthopedics;  Laterality: Left;   POSTERIOR CERVICAL FUSION/FORAMINOTOMY N/A 08/20/2020   Procedure: LEFT CERVICAL SIX THROUGH SEVEN  AND CERVICAL SEVEN THROUGH THORACIC ONE  FORAMINOTOMIES WITH EXCISION OF DISC HERNIATION LEFT CERVICAL SEVEN THROUGH THORACIC ONE;  Surgeon: Lucilla Lynwood BRAVO, MD;  Location: [facility] OR;  Service: Orthopedics;  Laterality: N/A;   SPINE SURGERY     TONSILLECTOMY     TURBINATE RESECTION  2007   UVULOPALATOPHARYNGOPLASTY     VIDEO BRONCHOSCOPY WITH RADIAL ENDOBRONCHIAL ULTRASOUND  04/28/2022   Procedure: VIDEO BRONCHOSCOPY WITH RADIAL ENDOBRONCHIAL ULTRASOUND;  Surgeon: Shelah Lamar RAMAN, MD;  Location: [facility] ENDOSCOPY;  Service: Pulmonary;;   Patient Active Problem List   Diagnosis Date Noted   COPD exacerbation (HCC) 04/26/2024   Hymenoptera reaction 03/25/2024   COVID-19  02/22/2024   Acute on chronic diastolic heart failure (HCC) 09/30/2023   Diabetes mellitus (HCC) 08/31/2023   Rash 08/31/2023   Cognitive changes 08/31/2023   Unilateral primary osteoarthritis, left knee 02/24/2022   Unilateral primary osteoarthritis, right knee 02/24/2022   Pulmonary nodules 01/28/2022   History of adenomatous polyp of colon 01/08/2022   Hypokalemia 09/26/2021   Dyspnea 09/25/2021   Acute deep vein thrombosis (DVT) of right lower extremity (HCC) 09/24/2021   OSA (obstructive sleep apnea) 09/24/2021   Herniation of cervical intervertebral disc with radiculopathy    Status post cervical discectomy 08/20/2020   Tracheomalacia 05/26/2019   Acquired tracheomalacia 05/26/2019   HNP (herniated nucleus pulposus) with myelopathy, cervical 05/23/2019    Class: Chronic   Spinal stenosis of cervical region 05/23/2019    Class: Chronic   Status post cervical spinal fusion 05/23/2019   Chronic respiratory failure with hypoxia (HCC) 04/12/2018   Bilateral lower extremity edema 12/29/2017   Hymenoptera allergy 11/10/2017   Family history of colonic polyps 08/07/2017   Diverticulosis 08/07/2017   Flatus 08/07/2017   Myofascial pain 08/06/2017   Seasonal and perennial allergic rhinitis 04/23/2017   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/2017   Cervicalgia 04/01/2017   Recurrent pneumonia 03/09/2017   Other spondylosis with radiculopathy, cervical region 02/09/2017   Depression with anxiety 01/04/2017   Memory difficulty 12/05/2016   Diarrhea in adult patient 12/03/2016   Internal hemorrhoids 12/03/2016   Chronic migraine without aura without status migrainosus, not intractable 10/30/2016   Lumbar radiculopathy 05/15/2016   Osteoarthritis of spine with radiculopathy, lumbar region 05/15/2016   Risk for falls 05/15/2016   Recurrent infections 03/29/2016   Chronic nonseasonal allergic rhinitis due to pollen 03/29/2016   Polypharmacy 01/16/2016   Morbid obesity with BMI of 45.0-49.9,  adult (HCC) 03/06/2015   SDAT 02/05/2015   OSA and COPD overlap syndrome (HCC) 02/05/2015   Medication management 08/02/2014   GERD (gastroesophageal reflux disease) 05/09/2014   Vitamin D  deficiency 08/01/2013   Positive TB test 07/29/2011   Diverticula of colon 05/07/2011   Hypertension 01/31/2011   Hyperlipidemia, mixed 01/31/2011   BPH (benign prostatic hyperplasia) 01/31/2011   Testosterone  Deficiency 01/31/2011   IBS (irritable bowel syndrome) 01/31/2011   Partial complex seizure disorder with intractable epilepsy (HCC) 01/31/2011   Depression, major, recurrent, in partial remission (HCC) 01/31/2011   Asthma-COPD overlap syndrome (HCC) 01/31/2011    PCP: Quintin Crane MD  REFERRING PROVIDER: Vernetta Lonni GRADE, MD   REFERRING DIAG:  [phone] (ICD-10-CM) - Chronic pain of left knee  M25.561,G89.29 (ICD-10-CM) - Chronic pain of right knee    THERAPY  DIAG:  Bilateral chronic knee pain  Muscle weakness (generalized)  Other abnormalities of gait and mobility  Rationale for Evaluation and Treatment: Rehabilitation  ONSET DATE: >5 yrs  SUBJECTIVE:   SUBJECTIVE STATEMENT: Pt 4/10 knees.  Was able to climb stairs the other day without a lot of pain  Initial Subjective OA bilateral knees.  Not a surgical candidate at this time due to elevated BMI. Received steroid injections 01/18/24. Twisted my R knee the other day have basically stayed off of it. R knee hurting > left today usually the other way around. TBI 1985 with left sided le weakness; Had a fall in 2019 hurt my neck was paralyzed waist down resolved on its own, then had a cervical fusion 2020.  Pt reports he does not do a lot of walking as he feels unsafe.   PERTINENT HISTORY: 2020: cervical decompression discectomy/fusion Acute on chronic diastolic heart failure Closed head injury 1985 with residual left side weakness (left leg drags) PAIN:  Are you having pain? Yes: NPRS scale: see below Pain  location: knees 4/10, neck 6-7/10 Pain description: achy; sharp Aggravating factors: walking; standing Relieving factors: sitting; elevation; ice  PRECAUTIONS: None  RED FLAGS: None   WEIGHT BEARING RESTRICTIONS: No  FALLS:  Has patient fallen in last 6 months? 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  DIAGNOSTIC FINDINGS:  L knee x-ray 2023: 2 views of the left knee show tricompartment arthritis mainly involving  the medial compartment of the knee.  This is moderate arthritis.   R knee x-ray 2023 2 views of the right knee show no significant malalignment.  There is mild  tricompartment arthritic changes.   PATIENT SURVEYS:  Lefs: 22/80 04/29/24:21/80  COGNITION: Overall cognitive status: Within functional limits for tasks assessed     SENSATION: WFL  EDEMA:  Minimal right knee  MUSCLE LENGTH: Hamstrings: tight bilaterally tested in sitting  POSTURE: rounded shoulders, forward head, and slight knee varus bilaterally  PALPATION: TTP medial joint line left knee  LOWER EXTREMITY ROM:  Active ROM Right eval Left eval Right 04/29/24  Hip flexion     Hip extension     Hip abduction     Hip adduction     Hip internal rotation     Hip external rotation     Knee flexion 105 130 130  Knee extension 0 0 0  Ankle dorsiflexion     Ankle plantarflexion     Ankle inversion     Ankle eversion      (Blank rows = not tested)  LOWER EXTREMITY MMT:  MMT Right eval Left eval Right / L 05/04/24   Hip flexion 20.5 32.9 34.5  Hip extension     Hip abduction     Hip adduction     Hip internal rotation     Hip external rotation     Knee flexion 4/5 53.0   Knee extension 5/5 33.8 43.2 / 36.4  Ankle dorsiflexion 5 5   Ankle plantarflexion     Ankle inversion     Ankle eversion      (  Blank rows = not tested)  FUNCTIONAL TESTS:  Timed up and go (TUG): 28.67  4 stage balance: Passed 1&2.  Tandem and SLS unable 04/29/24: 18.58 sec with SPC  GAIT: Distance walked: 400 ft Assistive device utilized: Single point cane Level of assistance: Complete Independence Comments: Pt pauses upon standing then initiates gait. Knee flex                                                                                                                                 TREATMENT  [facility] Adult PT Treatment:                                                DATE: 05/18/24 Pt seen for aquatic therapy today.  Treatment took place in water 3.5-4.75 ft in depth at the [facility] pool. Temp of water was 91.  Pt entered/exited the pool via stairs using step to pattern with hand rail.  * walking forward/ backward /side with reciprocal arm swing * side stepping blue HB add/abd *staggered stance and wide stance: arm swings using red hand bells 3 sets 8 slow 5-8 fast *forward and backward march with kick  *toe tapping bottom then 2nd step x 10 unsupported *step ups leading R/L x 10 unsupported (no pain. 2-3 LOB with self correction) *UE support on blue hand floats heel/toe raises x 15; hip add/abdct x10; alternating hamstring curls  x 10 each *forward and backward tandem walking ue support yellow HB      Pt requires the buoyancy and hydrostatic pressure of water for support, and to offload joints by unweighting joint load by at least 50 % in navel deep water and by at least 75-80% in chest to neck deep water.  Viscosity of the water is needed for resistance of strengthening. Water current perturbations  provides challenge to standing balance requiring increased core activation.       PATIENT EDUCATION:  Education details: intro to aquatic therapy   Person educated: Patient Education method: Programmer, Multimedia, demo,  Education comprehension: verbalized understanding, return demo  HOME EXERCISE PROGRAM: tba  ASSESSMENT:  CLINICAL IMPRESSION: Pt reports increased toleration to activitiy at home climbing multiple stairs with just a slight increase in knee pain. He arrives today with just minor knee pain at initiation of session.  He tolerates progressing balance and strengthening exercises.  No pain with step ups onto and off of bottom step.  He is challenged with dynamic balance but is able to master as exercise progresses.  No pain reported throughout session. Goals ongoing    PN: see note below. Testing demonstrates improved right hip strength meeting LTG.  He continues to have reduced pain sensitivity since injections reporting increased ease with functional mobility. He will continue to benefit from skilled PT aquatic intervention to progress towards  previous and new stated goals. May consider adding land based intervention going forward. Last session: It has been 1 month since patient's last session due to scheduling difficulties.  He reports reduction in knee pain since recent injections.  Pt has reported some improvements in mobility since starting therapy.  His TUG time improved by 10 sec. Pt reported good toleration to exercises performed in water today. Pt has met STG3 and LTG 4.  Therapist to assess remaining goals next visit for recert.    OBJECTIVE IMPAIRMENTS: Abnormal gait, decreased activity tolerance, decreased balance, decreased knowledge of use of DME, decreased mobility, difficulty walking, decreased ROM, decreased strength, obesity, and pain.   ACTIVITY LIMITATIONS: carrying, lifting, sitting, squatting, stairs, transfers, and locomotion level  PARTICIPATION LIMITATIONS:  meal prep, cleaning, laundry, shopping, community activity, and yard work  PERSONAL FACTORS: Fitness and 3+ comorbidities: see PmHx are also affecting patient's functional outcome.   REHAB POTENTIAL: Good  CLINICAL DECISION MAKING: Stable/uncomplicated  EVALUATION COMPLEXITY: Low   GOALS: Goals reviewed with patient? No  SHORT TERM GOALS: Target date: 03/26/24 Pt will tolerate full aquatic sessions consistently without increase in pain and with improving function to demonstrate good toleration and effectiveness of intervention.  Baseline: Goal status: Met 03/30/24  2.  Pt will tolerate stair climbing using alternating or combination of alternating and step to pattern ascending and descending 6 steps with use of handrail  Baseline:  Goal status: Met 03/30/24  3.  Pt will consider gaining pool access for use of the properties of water for chronic conditions maintaining mobility and minimizing pain. Baseline: has [employer]; hasn't been there yet Goal status: MET - 04/29/24    LONG TERM GOALS: Target date: 06/24/24  Pt to improve on LEFS by at least 9 point to demonstrate statistically significant Improvement in function. Baseline: see above - 04/29/24 Goal status: In progress   2. Pt will tolerate walking to and from setting and engaging in aquatic therapy session without excessive fatigue or increase in pain to demonstrate improved toleration to activity.  Baseline: improving; varies day to day Goal status:in progress - 04/29/24  3.  Pt will improve strength in RLE within 5 lbs of left to demonstrate improved overall physical function Baseline:  Goal status: Met 05/04/24  4.  Pt will improve ROM of  R knee flex to within 5d of left to improve function, decrease risk of injury and pain and improve quality of gait Baseline: See chart Goal status: MET- 04/29/24  5.  Pt will be indep with final HEP's (land and aquatic as appropriate) for continued management of  condition Baseline:  Goal status: INITIAL   6. Pt will perform Tandem and SLS in 3.6 ft unsupported x 15s to demonstrate improved balance   Baseline: see above   Goals Status: NEW   7. Pt will perform 10 consecutive STS from water bench onto water step to demonstrate improved immediate standing balance and strength.   Baseline:   Goal Status: NEW   PLAN:  PT FREQUENCY: 1-2x/week  PT DURATION: 8 week  PLANNED INTERVENTIONS: 97164- PT Re-evaluation, 97750- Physical Performance Testing, 97110-Therapeutic exercises, 97530- Therapeutic activity, [id number]- Neuromuscular re-education, 97535- Self Care, 02859- Manual therapy, [phone]- Gait training, [phone]- Aquatic Therapy, [phone]- Ionotophoresis 4mg /ml Dexamethasone , 79439 (1-2 muscles), 20561 (3+ muscles)- Dry Needling, Patient/Family education, Balance training, Stair training, Taping, Joint mobilization, DME instructions, Cryotherapy, and Moist heat  PLAN FOR NEXT SESSION: Aquatic: general strengthening with focus on knees and core; balance and proprioceptive retraining; stair  climbing Land same as [employer].  [address] [place]) Devone Tousley MPT 05/18/2024 2:43 PM [facility] [address] [place], [place], [address] Phone: [phone]   Fax:  [phone]   Addend Ronal Foots) Ernesto Lashway MPT 05/25/2024 2:57 PM [facility] [address] [place], [place], [address] Phone: [phone]   Fax:  [phone]   "

## 2024-05-23 ENCOUNTER — Encounter: Payer: BC Managed Care – PPO | Admitting: Internal Medicine

## 2024-05-25 ENCOUNTER — Ambulatory Visit (HOSPITAL_BASED_OUTPATIENT_CLINIC_OR_DEPARTMENT_OTHER): Admitting: Physical Therapy

## 2024-05-25 ENCOUNTER — Other Ambulatory Visit (HOSPITAL_BASED_OUTPATIENT_CLINIC_OR_DEPARTMENT_OTHER): Payer: Self-pay | Admitting: Family Medicine

## 2024-05-25 ENCOUNTER — Encounter (HOSPITAL_BASED_OUTPATIENT_CLINIC_OR_DEPARTMENT_OTHER): Payer: Self-pay | Admitting: Physical Therapy

## 2024-05-25 ENCOUNTER — Other Ambulatory Visit: Payer: Self-pay | Admitting: Nurse Practitioner

## 2024-05-25 DIAGNOSIS — M25561 Pain in right knee: Secondary | ICD-10-CM | POA: Diagnosis not present

## 2024-05-25 DIAGNOSIS — G8929 Other chronic pain: Secondary | ICD-10-CM

## 2024-05-25 DIAGNOSIS — M6281 Muscle weakness (generalized): Secondary | ICD-10-CM

## 2024-05-25 DIAGNOSIS — R2689 Other abnormalities of gait and mobility: Secondary | ICD-10-CM

## 2024-05-25 NOTE — Therapy (Signed)
 " OUTPATIENT PHYSICAL THERAPY LOWER EXTREMITY TREATMENT       Patient Name: Mark Harrell. MRN: [id number] DOB:12-May-1961, 63 y.o., male Today's Date: 05/25/2024  END OF SESSION:  PT End of Session - 05/25/24 1452     Visit Number 10    Date for Recertification  06/24/24    Authorization Type BCBS    Authorization Time Period [place] [employer]  OOPM MET  Auth Req.  visit #3/5 visits approved  From 12.05.2025 - 02.02.2026  Tracking #[id number]  MB ([place])    Authorization - Visit Number 5    Authorization - Number of Visits 5    Progress Note Due on Visit 17    PT Start Time 1442    PT Stop Time 1520    PT Time Calculation (min) 38 min    Activity Tolerance Patient tolerated treatment well    Behavior During Therapy WFL for tasks assessed/performed             Past Medical History:  Diagnosis Date   Allergy    Anesthesia complication requiring reversal agent administration    ? from central apnea, very difficult to get off vent   Anxiety    Arthritis    osteo   Asthma    BPH (benign prostatic hyperplasia)    Cataract    CHF (congestive heart failure) (HCC)    Complication of anesthesia    difficulty waking , they twlight me because of my respiratory problems    COPD (chronic obstructive pulmonary disease) (HCC)    COVID    moderate case 2023   Depression    Diabetes mellitus (HCC) 08/31/2023   DVT (deep venous thrombosis) (HCC) 2023   right leg   Dyspnea    on exertion   Enlarged heart    Family history of adverse reaction to anesthesia    mother trouble waking up, and heart stopped   GERD (gastroesophageal reflux disease)    Headache    botox  injections for headaches   Hyperlipidemia    Hypertension    Hypogonadism male    IBS (irritable bowel syndrome)    Kidney stones    Memory difficulties    short term memories   Neuromuscular disorder (HCC)    Neuropathy    Obesity    On home oxygen  therapy    on 2 liter   OSA (obstructive sleep apnea)    not  using cpap   Oxygen  deficiency    Paralysis (HCC)    left hand - small    due to accident with an head injury   Pneumonia    Pre-diabetes    Prostatitis    Sleep apnea    Ulcer    Past Surgical History:  Procedure Laterality Date   ABDOMINAL SURGERY     ANKLE FRACTURE SURGERY Right    ANTERIOR CERVICAL DECOMP/DISCECTOMY FUSION N/A 05/23/2019   Procedure: ANTERIOR CERVICAL DISCECTOMY FUSION CERVICAL FIVE THROUGH CERVICAL SIX AND CERVICAL SIX THROUGH CERVICAL SEVEN;  Surgeon: Lucilla Lynwood BRAVO, MD;  Location: [facility] OR;  Service: Orthopedics;  Laterality: N/A;   BRONCHIAL BRUSHINGS  04/28/2022   Procedure: BRONCHIAL BRUSHINGS;  Surgeon: Shelah Lamar RAMAN, MD;  Location: [facility] ENDOSCOPY;  Service: Pulmonary;;   BRONCHIAL NEEDLE ASPIRATION BIOPSY  04/28/2022   Procedure: BRONCHIAL NEEDLE ASPIRATION BIOPSIES;  Surgeon: Shelah Lamar RAMAN, MD;  Location: [facility] ENDOSCOPY;  Service: Pulmonary;;   COLONOSCOPY     CYSTOSCOPY     [place]   FRACTURE SURGERY  KNEE ARTHROSCOPY WITH MEDIAL MENISECTOMY Left 01/02/2017   Procedure: LEFT KNEE ARTHROSCOPY WITH PARTIAL MEDIAL MENISCECTOMY;  Surgeon: Vernetta Lonni GRADE, MD;  Location: [facility] ORS;  Service: Orthopedics;  Laterality: Left;   POSTERIOR CERVICAL FUSION/FORAMINOTOMY N/A 08/20/2020   Procedure: LEFT CERVICAL SIX THROUGH SEVEN  AND CERVICAL SEVEN THROUGH THORACIC ONE  FORAMINOTOMIES WITH EXCISION OF DISC HERNIATION LEFT CERVICAL SEVEN THROUGH THORACIC ONE;  Surgeon: Lucilla Lynwood BRAVO, MD;  Location: [facility] OR;  Service: Orthopedics;  Laterality: N/A;   SPINE SURGERY     TONSILLECTOMY     TURBINATE RESECTION  2007   UVULOPALATOPHARYNGOPLASTY     VIDEO BRONCHOSCOPY WITH RADIAL ENDOBRONCHIAL ULTRASOUND  04/28/2022   Procedure: VIDEO BRONCHOSCOPY WITH RADIAL ENDOBRONCHIAL ULTRASOUND;  Surgeon: Shelah Lamar RAMAN, MD;  Location: [facility] ENDOSCOPY;  Service: Pulmonary;;   Patient Active Problem List   Diagnosis Date Noted   COPD exacerbation (HCC) 04/26/2024   Hymenoptera  reaction 03/25/2024   COVID-19 02/22/2024   Acute on chronic diastolic heart failure (HCC) 09/30/2023   Diabetes mellitus (HCC) 08/31/2023   Rash 08/31/2023   Cognitive changes 08/31/2023   Unilateral primary osteoarthritis, left knee 02/24/2022   Unilateral primary osteoarthritis, right knee 02/24/2022   Pulmonary nodules 01/28/2022   History of adenomatous polyp of colon 01/08/2022   Hypokalemia 09/26/2021   Dyspnea 09/25/2021   Acute deep vein thrombosis (DVT) of right lower extremity (HCC) 09/24/2021   OSA (obstructive sleep apnea) 09/24/2021   Herniation of cervical intervertebral disc with radiculopathy    Status post cervical discectomy 08/20/2020   Tracheomalacia 05/26/2019   Acquired tracheomalacia 05/26/2019   HNP (herniated nucleus pulposus) with myelopathy, cervical 05/23/2019    Class: Chronic   Spinal stenosis of cervical region 05/23/2019    Class: Chronic   Status post cervical spinal fusion 05/23/2019   Chronic respiratory failure with hypoxia (HCC) 04/12/2018   Bilateral lower extremity edema 12/29/2017   Hymenoptera allergy 11/10/2017   Family history of colonic polyps 08/07/2017   Diverticulosis 08/07/2017   Flatus 08/07/2017   Myofascial pain 08/06/2017   Seasonal and perennial allergic rhinitis 04/23/2017   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/2017   Cervicalgia 04/01/2017   Recurrent pneumonia 03/09/2017   Other spondylosis with radiculopathy, cervical region 02/09/2017   Depression with anxiety 01/04/2017   Memory difficulty 12/05/2016   Diarrhea in adult patient 12/03/2016   Internal hemorrhoids 12/03/2016   Chronic migraine without aura without status migrainosus, not intractable 10/30/2016   Lumbar radiculopathy 05/15/2016   Osteoarthritis of spine with radiculopathy, lumbar region 05/15/2016   Risk for falls 05/15/2016   Recurrent infections 03/29/2016   Chronic nonseasonal allergic rhinitis due to pollen 03/29/2016   Polypharmacy 01/16/2016   Morbid  obesity with BMI of 45.0-49.9, adult (HCC) 03/06/2015   SDAT 02/05/2015   OSA and COPD overlap syndrome (HCC) 02/05/2015   Medication management 08/02/2014   GERD (gastroesophageal reflux disease) 05/09/2014   Vitamin D  deficiency 08/01/2013   Positive TB test 07/29/2011   Diverticula of colon 05/07/2011   Hypertension 01/31/2011   Hyperlipidemia, mixed 01/31/2011   BPH (benign prostatic hyperplasia) 01/31/2011   Testosterone  Deficiency 01/31/2011   IBS (irritable bowel syndrome) 01/31/2011   Partial complex seizure disorder with intractable epilepsy (HCC) 01/31/2011   Depression, major, recurrent, in partial remission (HCC) 01/31/2011   Asthma-COPD overlap syndrome (HCC) 01/31/2011    PCP: Quintin Crane MD  REFERRING PROVIDER: Vernetta Lonni GRADE, MD   REFERRING DIAG:  [phone] (ICD-10-CM) - Chronic pain of left knee  M25.561,G89.29 (ICD-10-CM) -  Chronic pain of right knee    THERAPY DIAG:  Bilateral chronic knee pain  Muscle weakness (generalized)  Other abnormalities of gait and mobility  Rationale for Evaluation and Treatment: Rehabilitation  ONSET DATE: >5 yrs  SUBJECTIVE:   SUBJECTIVE STATEMENT: Pt 4/10 knees. Neck is hurting worse had multiple surgeries there.  Initial Subjective OA bilateral knees.  Not a surgical candidate at this time due to elevated BMI. Received steroid injections 01/18/24. Twisted my R knee the other day have basically stayed off of it. R knee hurting > left today usually the other way around. TBI 1985 with left sided le weakness; Had a fall in 2019 hurt my neck was paralyzed waist down resolved on its own, then had a cervical fusion 2020.  Pt reports he does not do a lot of walking as he feels unsafe.   PERTINENT HISTORY: 2020: cervical decompression discectomy/fusion Acute on chronic diastolic heart failure Closed head injury 1985 with residual left side weakness (left leg drags) PAIN:  Are you having pain? Yes: NPRS  scale: see below Pain location: knees 4/10, neck 6-7/10 Pain description: achy; sharp Aggravating factors: walking; standing Relieving factors: sitting; elevation; ice  PRECAUTIONS: None  RED FLAGS: None   WEIGHT BEARING RESTRICTIONS: No  FALLS:  Has patient fallen in last 6 months? 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  DIAGNOSTIC FINDINGS:  L knee x-ray 2023: 2 views of the left knee show tricompartment arthritis mainly involving  the medial compartment of the knee.  This is moderate arthritis.   R knee x-ray 2023 2 views of the right knee show no significant malalignment.  There is mild  tricompartment arthritic changes.   PATIENT SURVEYS:  Lefs: 22/80 04/29/24:21/80  COGNITION: Overall cognitive status: Within functional limits for tasks assessed     SENSATION: WFL  EDEMA:  Minimal right knee  MUSCLE LENGTH: Hamstrings: tight bilaterally tested in sitting  POSTURE: rounded shoulders, forward head, and slight knee varus bilaterally  PALPATION: TTP medial joint line left knee  LOWER EXTREMITY ROM:  Active ROM Right eval Left eval Right 04/29/24  Hip flexion     Hip extension     Hip abduction     Hip adduction     Hip internal rotation     Hip external rotation     Knee flexion 105 130 130  Knee extension 0 0 0  Ankle dorsiflexion     Ankle plantarflexion     Ankle inversion     Ankle eversion      (Blank rows = not tested)  LOWER EXTREMITY MMT:  MMT Right eval Left eval  Right / L 05/04/24  Hip flexion 20.5 32.9 34.5  Hip extension     Hip abduction     Hip adduction     Hip internal rotation     Hip external rotation     Knee flexion 4/5 53.0   Knee extension 5/5 33.8 43.2 / 36.4  Ankle dorsiflexion 5 5   Ankle plantarflexion     Ankle inversion  Ankle eversion      (Blank rows = not tested)  FUNCTIONAL TESTS:  Timed up and go (TUG): 28.67  4 stage balance: Passed 1&2.  Tandem and SLS unable 04/29/24: 18.58 sec with SPC  GAIT: Distance walked: 400 ft Assistive device utilized: Single point cane Level of assistance: Complete Independence Comments: Pt pauses upon standing then initiates gait. Knee flex                                                                                                                                 TREATMENT  [facility] Adult PT Treatment:                                                DATE: 05/25/24 Pt seen for aquatic therapy today.  Treatment took place in water 3.5-4.75 ft in depth at the [facility] pool. Temp of water was 91.  Pt entered/exited the pool via stairs using step to pattern with hand rail.  * walking forward/ backward /side with reciprocal arm swing * side stepping blue HB add/abd *shoulder horizontal add/abd; Bow & arrow; chin juts *staggered stance and wide stance: arm swings using red hand bells 3 sets 8 slow 5-8 fast *forward and backward march with kick  *toe tapping bottom then 2nd step x 10 unsupported *step ups leading R/L x 10 unsupported (no pain. 2-3 LOB with self correction)    *UE support on blue hand floats heel/toe raises x 15; hip add/abdct x10; alternating hamstring curls  x 10 each *forward and backward tandem walking ue support yellow HB      Pt requires the buoyancy and hydrostatic pressure of water for support, and to offload joints by unweighting joint load by at least 50 % in navel deep water and by at least 75-80% in chest to neck deep water.  Viscosity of  the water is needed for resistance of strengthening. Water current perturbations provides challenge to standing balance requiring increased core activation.       PATIENT EDUCATION:  Education details: intro to aquatic therapy   Person educated: Patient Education method: Programmer, Multimedia, demo,  Education comprehension: verbalized understanding, return demo  HOME EXERCISE PROGRAM: tba  ASSESSMENT:  CLINICAL IMPRESSION: Pt reports increased cervical discomfort today limited some toleration to session. Added shoulder/cervical ROM for reduced discomfort in area. Progressed core engagement using bells. VC for relaxed posture throughout. Worked knee proprioception with SLS and stair tapping.SABRA No pain reported.  Good toleration     PN: see note below. Testing demonstrates improved right hip strength meeting LTG.  He continues to have reduced pain sensitivity since injections reporting increased ease with functional mobility. He will continue to benefit from skilled PT aquatic intervention to progress towards previous and new stated goals. May consider adding land based intervention going  forward. Last session: It has been 1 month since patient's last session due to scheduling difficulties.  He reports reduction in knee pain since recent injections.  Pt has reported some improvements in mobility since starting therapy.  His TUG time improved by 10 sec. Pt reported good toleration to exercises performed in water today. Pt has met STG3 and LTG 4.  Therapist to assess remaining goals next visit for recert.    OBJECTIVE IMPAIRMENTS: Abnormal gait, decreased activity tolerance, decreased balance, decreased knowledge of use of DME, decreased mobility, difficulty walking, decreased ROM, decreased strength, obesity, and pain.   ACTIVITY LIMITATIONS: carrying, lifting, sitting, squatting, stairs, transfers, and locomotion level  PARTICIPATION LIMITATIONS: meal prep, cleaning, laundry, shopping, community  activity, and yard work  PERSONAL FACTORS: Fitness and 3+ comorbidities: see PmHx are also affecting patient's functional outcome.   REHAB POTENTIAL: Good  CLINICAL DECISION MAKING: Stable/uncomplicated  EVALUATION COMPLEXITY: Low   GOALS: Goals reviewed with patient? No  SHORT TERM GOALS: Target date: 03/26/24 Pt will tolerate full aquatic sessions consistently without increase in pain and with improving function to demonstrate good toleration and effectiveness of intervention.  Baseline: Goal status: Met 03/30/24  2.  Pt will tolerate stair climbing using alternating or combination of alternating and step to pattern ascending and descending 6 steps with use of handrail  Baseline:  Goal status: Met 03/30/24  3.  Pt will consider gaining pool access for use of the properties of water for chronic conditions maintaining mobility and minimizing pain. Baseline: has [employer]; hasn't been there yet Goal status: MET - 04/29/24    LONG TERM GOALS: Target date: 06/24/24  Pt to improve on LEFS by at least 9 point to demonstrate statistically significant Improvement in function. Baseline: see above - 04/29/24 Goal status: In progress   2. Pt will tolerate walking to and from setting and engaging in aquatic therapy session without excessive fatigue or increase in pain to demonstrate improved toleration to activity.  Baseline: improving; varies day to day Goal status:in progress - 04/29/24  3.  Pt will improve strength in RLE within 5 lbs of left to demonstrate improved overall physical function Baseline:  Goal status: Met 05/04/24  4.  Pt will improve ROM of  R knee flex to within 5d of left to improve function, decrease risk of injury and pain and improve quality of gait Baseline: See chart Goal status: MET- 04/29/24  5.  Pt will be indep with final HEP's (land and aquatic as appropriate) for continued management of condition Baseline:  Goal status: INITIAL   6. Pt will perform  Tandem and SLS in 3.6 ft unsupported x 15s to demonstrate improved balance   Baseline: see above   Goals Status: NEW   7. Pt will perform 10 consecutive STS from water bench onto water step to demonstrate improved immediate standing balance and strength.   Baseline:   Goal Status: NEW   PLAN:  PT FREQUENCY: 1-2x/week  PT DURATION: 8 week  PLANNED INTERVENTIONS: 97164- PT Re-evaluation, 97750- Physical Performance Testing, 97110-Therapeutic exercises, 97530- Therapeutic activity, [id number]- Neuromuscular re-education, 97535- Self Care, 02859- Manual therapy, [phone]- Gait training, [phone]- Aquatic Therapy, [phone]- Ionotophoresis 4mg /ml Dexamethasone , 79439 (1-2 muscles), 20561 (3+ muscles)- Dry Needling, Patient/Family education, Balance training, Stair training, Taping, Joint mobilization, DME instructions, Cryotherapy, and Moist heat  PLAN FOR NEXT SESSION: Aquatic: general strengthening with focus on knees and core; balance and proprioceptive retraining; stair climbing Land same as [employer].  Ronal Kem) Zanetta Dehaan MPT 05/25/2024 2:57  PM [facility] [address] [place], [place], [address] Phone: [phone]   Fax:  [phone]   "

## 2024-05-30 ENCOUNTER — Other Ambulatory Visit (HOSPITAL_COMMUNITY): Payer: Self-pay

## 2024-05-31 ENCOUNTER — Encounter (HOSPITAL_BASED_OUTPATIENT_CLINIC_OR_DEPARTMENT_OTHER): Payer: Self-pay | Admitting: Physical Therapy

## 2024-05-31 ENCOUNTER — Ambulatory Visit (HOSPITAL_BASED_OUTPATIENT_CLINIC_OR_DEPARTMENT_OTHER): Attending: Orthopaedic Surgery | Admitting: Physical Therapy

## 2024-05-31 DIAGNOSIS — R2689 Other abnormalities of gait and mobility: Secondary | ICD-10-CM | POA: Diagnosis present

## 2024-05-31 DIAGNOSIS — M25562 Pain in left knee: Secondary | ICD-10-CM | POA: Insufficient documentation

## 2024-05-31 DIAGNOSIS — M25561 Pain in right knee: Secondary | ICD-10-CM | POA: Diagnosis present

## 2024-05-31 DIAGNOSIS — M6281 Muscle weakness (generalized): Secondary | ICD-10-CM | POA: Insufficient documentation

## 2024-05-31 DIAGNOSIS — G8929 Other chronic pain: Secondary | ICD-10-CM | POA: Insufficient documentation

## 2024-05-31 NOTE — Therapy (Signed)
 " OUTPATIENT PHYSICAL THERAPY LOWER EXTREMITY TREATMENT       Patient Name: Mark Harrell. MRN: [id number] DOB:24-Aug-1960, 64 y.o., male Today's Date: 05/31/2024  END OF SESSION:  PT End of Session - 05/31/24 1358     Visit Number 11    Date for Recertification  06/24/24    Authorization Type [employer]    Authorization Time Period visit #1/9 visits approved  From 01.05.2026 - 03.05.2026    Authorization - Visit Number 1    Authorization - Number of Visits 9    Progress Note Due on Visit 17    PT Start Time 1400    PT Stop Time 1440    PT Time Calculation (min) 40 min    Activity Tolerance Patient tolerated treatment well    Behavior During Therapy WFL for tasks assessed/performed            Past Medical History:  Diagnosis Date   Allergy    Anesthesia complication requiring reversal agent administration    ? from central apnea, very difficult to get off vent   Anxiety    Arthritis    osteo   Asthma    BPH (benign prostatic hyperplasia)    Cataract    CHF (congestive heart failure) (HCC)    Complication of anesthesia    difficulty waking , they twlight me because of my respiratory problems    COPD (chronic obstructive pulmonary disease) (HCC)    COVID    moderate case 2023   Depression    Diabetes mellitus (HCC) 08/31/2023   DVT (deep venous thrombosis) (HCC) 2023   right leg   Dyspnea    on exertion   Enlarged heart    Family history of adverse reaction to anesthesia    mother trouble waking up, and heart stopped   GERD (gastroesophageal reflux disease)    Headache    botox  injections for headaches   Hyperlipidemia    Hypertension    Hypogonadism male    IBS (irritable bowel syndrome)    Kidney stones    Memory difficulties    short term memories   Neuromuscular disorder (HCC)    Neuropathy    Obesity    On home oxygen  therapy    on 2 liter   OSA (obstructive sleep apnea)    not using cpap   Oxygen  deficiency    Paralysis (HCC)    left hand -  small    due to accident with an head injury   Pneumonia    Pre-diabetes    Prostatitis    Sleep apnea    Ulcer    Past Surgical History:  Procedure Laterality Date   ABDOMINAL SURGERY     ANKLE FRACTURE SURGERY Right    ANTERIOR CERVICAL DECOMP/DISCECTOMY FUSION N/A 05/23/2019   Procedure: ANTERIOR CERVICAL DISCECTOMY FUSION CERVICAL FIVE THROUGH CERVICAL SIX AND CERVICAL SIX THROUGH CERVICAL SEVEN;  Surgeon: Lucilla Lynwood BRAVO, MD;  Location: [facility] OR;  Service: Orthopedics;  Laterality: N/A;   BRONCHIAL BRUSHINGS  04/28/2022   Procedure: BRONCHIAL BRUSHINGS;  Surgeon: Shelah Lamar RAMAN, MD;  Location: [facility] ENDOSCOPY;  Service: Pulmonary;;   BRONCHIAL NEEDLE ASPIRATION BIOPSY  04/28/2022   Procedure: BRONCHIAL NEEDLE ASPIRATION BIOPSIES;  Surgeon: Shelah Lamar RAMAN, MD;  Location: [facility] ENDOSCOPY;  Service: Pulmonary;;   COLONOSCOPY     CYSTOSCOPY     Tannebaum   FRACTURE SURGERY     KNEE ARTHROSCOPY WITH MEDIAL MENISECTOMY Left 01/02/2017   Procedure: LEFT KNEE ARTHROSCOPY  WITH PARTIAL MEDIAL MENISCECTOMY;  Surgeon: Vernetta Lonni GRADE, MD;  Location: [facility] ORS;  Service: Orthopedics;  Laterality: Left;   POSTERIOR CERVICAL FUSION/FORAMINOTOMY N/A 08/20/2020   Procedure: LEFT CERVICAL SIX THROUGH SEVEN  AND CERVICAL SEVEN THROUGH THORACIC ONE  FORAMINOTOMIES WITH EXCISION OF DISC HERNIATION LEFT CERVICAL SEVEN THROUGH THORACIC ONE;  Surgeon: Lucilla Lynwood BRAVO, MD;  Location: [facility] OR;  Service: Orthopedics;  Laterality: N/A;   SPINE SURGERY     TONSILLECTOMY     TURBINATE RESECTION  2007   UVULOPALATOPHARYNGOPLASTY     VIDEO BRONCHOSCOPY WITH RADIAL ENDOBRONCHIAL ULTRASOUND  04/28/2022   Procedure: VIDEO BRONCHOSCOPY WITH RADIAL ENDOBRONCHIAL ULTRASOUND;  Surgeon: Shelah Lamar RAMAN, MD;  Location: [facility] ENDOSCOPY;  Service: Pulmonary;;   Patient Active Problem List   Diagnosis Date Noted   COPD exacerbation (HCC) 04/26/2024   Hymenoptera reaction 03/25/2024   COVID-19 02/22/2024   Acute on chronic  diastolic heart failure (HCC) 09/30/2023   Diabetes mellitus (HCC) 08/31/2023   Rash 08/31/2023   Cognitive changes 08/31/2023   Unilateral primary osteoarthritis, left knee 02/24/2022   Unilateral primary osteoarthritis, right knee 02/24/2022   Pulmonary nodules 01/28/2022   History of adenomatous polyp of colon 01/08/2022   Hypokalemia 09/26/2021   Dyspnea 09/25/2021   Acute deep vein thrombosis (DVT) of right lower extremity (HCC) 09/24/2021   OSA (obstructive sleep apnea) 09/24/2021   Herniation of cervical intervertebral disc with radiculopathy    Status post cervical discectomy 08/20/2020   Tracheomalacia 05/26/2019   Acquired tracheomalacia 05/26/2019   HNP (herniated nucleus pulposus) with myelopathy, cervical 05/23/2019    Class: Chronic   Spinal stenosis of cervical region 05/23/2019    Class: Chronic   Status post cervical spinal fusion 05/23/2019   Chronic respiratory failure with hypoxia (HCC) 04/12/2018   Bilateral lower extremity edema 12/29/2017   Hymenoptera allergy 11/10/2017   Family history of colonic polyps 08/07/2017   Diverticulosis 08/07/2017   Flatus 08/07/2017   Myofascial pain 08/06/2017   Seasonal and perennial allergic rhinitis 04/23/2017   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/2017   Cervicalgia 04/01/2017   Recurrent pneumonia 03/09/2017   Other spondylosis with radiculopathy, cervical region 02/09/2017   Depression with anxiety 01/04/2017   Memory difficulty 12/05/2016   Diarrhea in adult patient 12/03/2016   Internal hemorrhoids 12/03/2016   Chronic migraine without aura without status migrainosus, not intractable 10/30/2016   Lumbar radiculopathy 05/15/2016   Osteoarthritis of spine with radiculopathy, lumbar region 05/15/2016   Risk for falls 05/15/2016   Recurrent infections 03/29/2016   Chronic nonseasonal allergic rhinitis due to pollen 03/29/2016   Polypharmacy 01/16/2016   Morbid obesity with BMI of 45.0-49.9, adult (HCC) 03/06/2015   SDAT  02/05/2015   OSA and COPD overlap syndrome (HCC) 02/05/2015   Medication management 08/02/2014   GERD (gastroesophageal reflux disease) 05/09/2014   Vitamin D  deficiency 08/01/2013   Positive TB test 07/29/2011   Diverticula of colon 05/07/2011   Hypertension 01/31/2011   Hyperlipidemia, mixed 01/31/2011   BPH (benign prostatic hyperplasia) 01/31/2011   Testosterone  Deficiency 01/31/2011   IBS (irritable bowel syndrome) 01/31/2011   Partial complex seizure disorder with intractable epilepsy (HCC) 01/31/2011   Depression, major, recurrent, in partial remission (HCC) 01/31/2011   Asthma-COPD overlap syndrome (HCC) 01/31/2011    PCP: Quintin Crane MD  REFERRING PROVIDER: Vernetta Lonni GRADE, MD   REFERRING DIAG:  [phone] (ICD-10-CM) - Chronic pain of left knee  M25.561,G89.29 (ICD-10-CM) - Chronic pain of right knee    THERAPY DIAG:  Bilateral chronic  knee pain  Muscle weakness (generalized)  Other abnormalities of gait and mobility  Rationale for Evaluation and Treatment: Rehabilitation  ONSET DATE: >5 yrs  SUBJECTIVE:   SUBJECTIVE STATEMENT: Having a rough day, knee pain 7/10.  Hurt for a few days in LB and knees after last session.  Initial Subjective OA bilateral knees.  Not a surgical candidate at this time due to elevated BMI. Received steroid injections 01/18/24. Twisted my R knee the other day have basically stayed off of it. R knee hurting > left today usually the other way around. TBI 1985 with left sided le weakness; Had a fall in 2019 hurt my neck was paralyzed waist down resolved on its own, then had a cervical fusion 2020.  Pt reports he does not do a lot of walking as he feels unsafe.   PERTINENT HISTORY: 2020: cervical decompression discectomy/fusion Acute on chronic diastolic heart failure Closed head injury 1985 with residual left side weakness (left leg drags) PAIN:  Are you having pain? Yes: NPRS scale: see below Pain location: knees  4/10, neck 6-7/10 Pain description: achy; sharp Aggravating factors: walking; standing Relieving factors: sitting; elevation; ice  PRECAUTIONS: None  RED FLAGS: None   WEIGHT BEARING RESTRICTIONS: No  FALLS:  Has patient fallen in last 6 months? 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  DIAGNOSTIC FINDINGS:  L knee x-ray 2023: 2 views of the left knee show tricompartment arthritis mainly involving  the medial compartment of the knee.  This is moderate arthritis.   R knee x-ray 2023 2 views of the right knee show no significant malalignment.  There is mild  tricompartment arthritic changes.   PATIENT SURVEYS:  Lefs: 22/80 04/29/24:21/80  COGNITION: Overall cognitive status: Within functional limits for tasks assessed     SENSATION: WFL  EDEMA:  Minimal right knee  MUSCLE LENGTH: Hamstrings: tight bilaterally tested in sitting  POSTURE: rounded shoulders, forward head, and slight knee varus bilaterally  PALPATION: TTP medial joint line left knee  LOWER EXTREMITY ROM:  Active ROM Right eval Left eval Right 04/29/24  Hip flexion     Hip extension     Hip abduction     Hip adduction     Hip internal rotation     Hip external rotation     Knee flexion 105 130 130  Knee extension 0 0 0  Ankle dorsiflexion     Ankle plantarflexion     Ankle inversion     Ankle eversion      (Blank rows = not tested)  LOWER EXTREMITY MMT:  MMT Right eval Left eval Right / L 05/04/24  Hip flexion 20.5  32.9 34.5  Hip extension     Hip abduction     Hip adduction     Hip internal rotation     Hip external rotation     Knee flexion 4/5 53.0   Knee extension 5/5 33.8 43.2 / 36.4  Ankle dorsiflexion 5 5   Ankle plantarflexion     Ankle inversion     Ankle eversion      (  Blank rows = not tested)  FUNCTIONAL TESTS:  Timed up and go (TUG): 28.67  4 stage balance: Passed 1&2.  Tandem and SLS unable 04/29/24: 18.58 sec with SPC  GAIT: Distance walked: 400 ft Assistive device utilized: Single point cane Level of assistance: Complete Independence Comments: Pt pauses upon standing then initiates gait. Knee flex                                                                                                                                 TREATMENT  [facility] Adult PT Treatment:                                                DATE: 05/31/24 Pt seen for aquatic therapy today.  Treatment took place in water 3.5-4.75 ft in depth at the [facility] pool. Temp of water was 91.  Pt entered/exited the pool via stairs using step to pattern with hand rail.  * walking forward/ backward /side with reciprocal arm swing * side stepping blue HB add/abd * Bow & arrow *staggered stance and wide stance: arm swings using red hand bells 3 sets 5-8 slow 5 fast *vertical decompression using white noodle: cycling; hip add/abd *white noodle scooting forward and back  Pt requires the buoyancy and hydrostatic pressure of water for support, and to offload joints by unweighting joint load by at least 50 % in navel deep water and by at least 75-80% in chest to neck deep water.  Viscosity of the water is needed for resistance of strengthening. Water current perturbations provides challenge to standing balance requiring increased core activation.       PATIENT EDUCATION:  Education details: intro to aquatic therapy   Person educated: Patient Education method: Programmer, Multimedia, demo,  Education comprehension:  verbalized understanding, return demo  HOME EXERCISE PROGRAM: tba  ASSESSMENT:  CLINICAL IMPRESSION: Pt reporting increased LB and knee pain after last session with elevated pain continuing today.  Session dialed back with elimination of SLS progressed exercise from last session. Pt given extra time between exercises as well as repetitions reduced for toleration. Goals ongoing       PN: see note below. Testing demonstrates improved right hip strength meeting LTG.  He continues to have reduced pain sensitivity since injections reporting increased ease with functional mobility. He will continue to benefit from skilled PT aquatic intervention to progress towards previous and new stated goals. May consider adding land based intervention going forward. Last session: It has been 1 month since patient's last session due to scheduling difficulties.  He reports reduction in knee pain since recent injections.  Pt has reported some improvements in mobility since starting therapy.  His TUG time improved by 10 sec. Pt reported good toleration to exercises performed in water today. Pt has met STG3 and LTG  4.  Therapist to assess remaining goals next visit for recert.    OBJECTIVE IMPAIRMENTS: Abnormal gait, decreased activity tolerance, decreased balance, decreased knowledge of use of DME, decreased mobility, difficulty walking, decreased ROM, decreased strength, obesity, and pain.   ACTIVITY LIMITATIONS: carrying, lifting, sitting, squatting, stairs, transfers, and locomotion level  PARTICIPATION LIMITATIONS: meal prep, cleaning, laundry, shopping, community activity, and yard work  PERSONAL FACTORS: Fitness and 3+ comorbidities: see PmHx are also affecting patient's functional outcome.   REHAB POTENTIAL: Good  CLINICAL DECISION MAKING: Stable/uncomplicated  EVALUATION COMPLEXITY: Low   GOALS: Goals reviewed with patient? No  SHORT TERM GOALS: Target date: 03/26/24 Pt will tolerate full  aquatic sessions consistently without increase in pain and with improving function to demonstrate good toleration and effectiveness of intervention.  Baseline: Goal status: Met 03/30/24  2.  Pt will tolerate stair climbing using alternating or combination of alternating and step to pattern ascending and descending 6 steps with use of handrail  Baseline:  Goal status: Met 03/30/24  3.  Pt will consider gaining pool access for use of the properties of water for chronic conditions maintaining mobility and minimizing pain. Baseline: has [employer]; hasn't been there yet Goal status: MET - 04/29/24    LONG TERM GOALS: Target date: 06/24/24  Pt to improve on LEFS by at least 9 point to demonstrate statistically significant Improvement in function. Baseline: see above - 04/29/24 Goal status: In progress   2. Pt will tolerate walking to and from setting and engaging in aquatic therapy session without excessive fatigue or increase in pain to demonstrate improved toleration to activity.  Baseline: improving; varies day to day Goal status:in progress - 04/29/24  3.  Pt will improve strength in RLE within 5 lbs of left to demonstrate improved overall physical function Baseline:  Goal status: Met 05/04/24  4.  Pt will improve ROM of  R knee flex to within 5d of left to improve function, decrease risk of injury and pain and improve quality of gait Baseline: See chart Goal status: MET- 04/29/24  5.  Pt will be indep with final HEP's (land and aquatic as appropriate) for continued management of condition Baseline:  Goal status: INITIAL   6. Pt will perform Tandem and SLS in 3.6 ft unsupported x 15s to demonstrate improved balance   Baseline: see above   Goals Status: NEW   7. Pt will perform 10 consecutive STS from water bench onto water step to demonstrate improved immediate standing balance and strength.   Baseline:   Goal Status: NEW   PLAN:  PT FREQUENCY: 1-2x/week  PT DURATION: 8  week  PLANNED INTERVENTIONS: [id number]- PT Re-evaluation, 97750- Physical Performance Testing, 97110-Therapeutic exercises, 97530- Therapeutic activity, [id number]- Neuromuscular re-education, 97535- Self Care, [id number]- Manual therapy, [phone]- Gait training, [phone]- Aquatic Therapy,  [phone] (1-2 muscles), [id number] (3+ muscles)- Dry Needling, Patient/Family education, Balance training, Stair training, Taping, Joint mobilization, DME instructions, Cryotherapy, and Moist heat  PLAN FOR NEXT SESSION: Aquatic: general strengthening with focus on knees and core; balance and proprioceptive retraining; stair climbing Land same as [employer].  [address] [place]) Pietrina Jagodzinski MPT 05/31/2024 2:52 PM [facility] [address] [place], [place], [address] Phone: [phone]   Fax:  [phone]   "

## 2024-06-01 ENCOUNTER — Encounter: Payer: Self-pay | Admitting: Cardiovascular Disease

## 2024-06-01 ENCOUNTER — Other Ambulatory Visit: Payer: Self-pay

## 2024-06-01 ENCOUNTER — Ambulatory Visit: Admitting: Cardiovascular Disease

## 2024-06-01 VITALS — BP 180/98 | HR 93 | Ht 72.0 in | Wt 348.8 lb

## 2024-06-01 DIAGNOSIS — R079 Chest pain, unspecified: Secondary | ICD-10-CM

## 2024-06-01 DIAGNOSIS — J449 Chronic obstructive pulmonary disease, unspecified: Secondary | ICD-10-CM | POA: Diagnosis not present

## 2024-06-01 DIAGNOSIS — R072 Precordial pain: Secondary | ICD-10-CM

## 2024-06-01 DIAGNOSIS — I10 Essential (primary) hypertension: Secondary | ICD-10-CM | POA: Diagnosis not present

## 2024-06-01 DIAGNOSIS — I5033 Acute on chronic diastolic (congestive) heart failure: Secondary | ICD-10-CM | POA: Diagnosis not present

## 2024-06-01 DIAGNOSIS — G4733 Obstructive sleep apnea (adult) (pediatric): Secondary | ICD-10-CM | POA: Diagnosis not present

## 2024-06-01 DIAGNOSIS — Z6841 Body Mass Index (BMI) 40.0 and over, adult: Secondary | ICD-10-CM | POA: Diagnosis not present

## 2024-06-01 DIAGNOSIS — E782 Mixed hyperlipidemia: Secondary | ICD-10-CM

## 2024-06-01 MED ORDER — METOPROLOL TARTRATE 50 MG PO TABS: 50.0000 mg | ORAL_TABLET | Freq: Two times a day (BID) | ORAL | 3 refills | Status: AC

## 2024-06-01 MED ORDER — HYDRALAZINE HCL 50 MG PO TABS: 50.0000 mg | ORAL_TABLET | Freq: Three times a day (TID) | ORAL | 3 refills | Status: AC

## 2024-06-01 NOTE — Assessment & Plan Note (Signed)
 BMI 47.  On Mounjaro .

## 2024-06-01 NOTE — Assessment & Plan Note (Signed)
 Patient complains of exertional chest pain which is new since I saw him 7 months ago.  Going to get a coronary CTA to further evaluate.

## 2024-06-01 NOTE — Assessment & Plan Note (Signed)
 History of obstructive sleep apnea intolerant to CPAP.  He gets night terrors.

## 2024-06-01 NOTE — Progress Notes (Signed)
 "     06/01/2024 [name]   18-Aug-1960  [id number]  Primary Physician de [place], Raymond J, MD Primary Cardiologist: Dorn JINNY Lesches MD [facility], [place], [place]  HPI:  Dennies Coate. is a 64 y.o.  morbidly overweight single Caucasian male who children referred by Dr. Tonita for cardiovascular evaluation because of hypertension, cardiac enlargement and lower extremity edema.  I last saw him in the office 09/30/2023.  He has no prior cardiac history. His risk factors include treated hypertension and hyperlipidemia. There is no family history. He's never had a heart attack or stroke. He denies chest pain but does get dyspneic on exertion probably related to his body habitus and deconditioning. He also has chronic mild lower extremity edema probably related to his obesity as well. He's been told that he has cardiac enlargement in the past. A 2D echocardiogram performed 02/23/2017 revealed normal LV size and function.  He does have obstructive sleep apnea currently not on CPAP.  He is also had an episode of chest pain which resulted in ER eval with negative troponins recently and has had several less intense episodes since that time which has been attributed to esophageal spasm or GERD.  Unfortunately, his body size and habitus preclude noninvasive evaluation.   Since I saw him in the office 7 months ago he has developed some exertional chest tightness.  He also complains of dyspnea but this is a chronic problem.  I suspect is related to obesity and deconditioning.  His blood pressure is out of control and we will adjust his medications.  He did have a 2D echocardiogram performed 11/04/2023 that revealed normal LV systolic function with indeterminate diastolic parameters and normal valvular function.  In Active Medications[1]   Allergies[2]  Social History   Socioeconomic History   Marital status: Single    Spouse name: Not on file   Number of children: Not on file   Years of education: Not on  file   Highest education level: Master's degree (e.g., MA, MS, MEng, MEd, MSW, MBA)  Occupational History   Occupation: quarry manager  Tobacco Use   Smoking status: Former    Current packs/day: 0.00    Average packs/day: 0.1 packs/day for 15.0 years (1.5 ttl pk-yrs)    Types: Cigarettes    Start date: 05/26/1968    Quit date: 05/27/1983    Years since quitting: 41.0    Passive exposure: Past   Smokeless tobacco: Never   Tobacco comments:    significant second-hand exposure through mother  Vaping Use   Vaping status: Never Used  Substance and Sexual Activity   Alcohol use: Not Currently    Comment: 2 x a year   Drug use: No   Sexual activity: Never    Birth control/protection: None  Other Topics Concern   Not on file  Social History Narrative   [facility]:   Originally from [place]. Previously has lived in [place] & [place]. He has lived in [place], [place], & [place]. He has worked in artist. No pets currently. Brief exposure to a roommates bird animator) in college. No mold, asbestos, or hot tub exposure.    Social Drivers of Health   Tobacco Use: Medium Risk (06/01/2024)   Patient History    Smoking Tobacco Use: Former    Smokeless Tobacco Use: Never    Passive Exposure: Past  Physicist, Medical Strain: Low Risk (02/22/2024)   Overall Financial Resource Strain (CARDIA)    Difficulty  of Paying Living Expenses: Not hard at all  Food Insecurity: No Food Insecurity (02/22/2024)   Epic    Worried About Programme Researcher, Broadcasting/film/video in the Last Year: Never true    Ran Out of Food in the Last Year: Never true  Transportation Needs: No Transportation Needs (02/22/2024)   Epic    Lack of Transportation (Medical): No    Lack of Transportation (Non-Medical): No  Physical Activity: Inactive (02/22/2024)   Exercise Vital Sign    Days of Exercise per Week: 0 days    Minutes of Exercise per Session: Not on file  Stress: Stress Concern Present (02/22/2024)   [employer] - Occupational Stress Questionnaire    Feeling of Stress: To some extent  Social Connections: Moderately Isolated (02/22/2024)   Social Connection and Isolation Panel    Frequency of Communication with Friends and Family: More than three times a week    Frequency of Social Gatherings with Friends and Family: More than three times a week    Attends Religious Services: 1 to 4 times per year    Active Member of [employer] or Organizations: No    Attends Engineer, Structural: Not on file    Marital Status: Never married  Intimate Partner Violence: Not At Risk (09/19/2022)   Received from [facility]   HITS    Over the last 12 months how often did your partner physically hurt you?: Never    Over the last 12 months how often did your partner insult you or talk down to you?: Never    Over the last 12 months how often did your partner threaten you with physical harm?: Never    Over the last 12 months how often did your partner scream or curse at you?: Never  Depression (PHQ2-9): Low Risk (02/22/2024)   Depression (PHQ2-9)    PHQ-2 Score: 0  Alcohol Screen: Not on file  Housing: Low Risk (02/22/2024)   Epic    Unable to Pay for Housing in the Last Year: No    Number of Times Moved in the Last Year: 0    Homeless in the Last Year: No  Utilities: Low Risk (05/06/2023)   Received from [facility]   Utilities    In the past 12 months has the electric, gas, oil, or water company threatened to shut off services in your home? : No  Health Literacy: Not on file     Review of Systems: General: negative for chills, fever, night sweats or weight changes.  Cardiovascular: negative for chest pain, dyspnea on exertion, edema, orthopnea, palpitations, paroxysmal nocturnal dyspnea or shortness of breath Dermatological: negative for rash Respiratory: negative for cough or wheezing Urologic: negative for hematuria Abdominal: negative for nausea, vomiting, diarrhea,  bright red blood per rectum, melena, or hematemesis Neurologic: negative for visual changes, syncope, or dizziness All other systems reviewed and are otherwise negative except as noted above.    Blood pressure (!) 180/98, pulse 93, height 6' (1.829 m), weight (!) 348 lb 12.8 oz (158.2 kg), SpO2 90%.  General appearance: alert and no distress Neck: no adenopathy, no carotid bruit, no JVD, supple, symmetrical, trachea midline, and thyroid  not enlarged, symmetric, no tenderness/mass/nodules Lungs: clear to auscultation bilaterally Heart: regular rate and rhythm, S1, S2 normal, no murmur, click, rub or gallop Extremities: extremities normal, atraumatic, no cyanosis or edema Pulses: 2+ and symmetric Skin: Skin color, texture, turgor normal. No rashes or lesions Neurologic: Grossly normal  EKG EKG  Interpretation Date/Time:  Wednesday June 01 2024 15:06:35 EST Ventricular Rate:  93 PR Interval:  218 QRS Duration:  96 QT Interval:  382 QTC Calculation: 474 R Axis:   -31  Text Interpretation: Sinus rhythm with 1st degree A-V block Possible Left atrial enlargement Left axis deviation Possible Inferior infarct , age undetermined When compared with ECG of 30-Sep-2023 16:20, Borderline criteria for Inferior infarct are now Present Confirmed by Court Carrier [id number]) on 06/01/2024 3:27:59 PM    ASSESSMENT AND PLAN:   Hypertension History of essential hypertension blood pressure measured today at 180/98.  He is on valsartan , metoprolol  and hydralazine  which I am going to adjust.  Am going to have him keep a blood pressure log for 30 days and have her see Pharm.D. after that to review and make appropriate changes.  Hyperlipidemia, mixed History of hyperlipidemia on statin therapy with lipid profile performed 12//24 revealing total cholesterol 54, LDL of 83 and HDL 34.  OSA and COPD overlap syndrome (HCC) History of obstructive sleep apnea intolerant to CPAP.  He gets night terrors.  Morbid  obesity with BMI of 45.0-49.9, adult (HCC) BMI 47.  On Mounjaro .  Acute on chronic diastolic heart failure (HCC) Chronic diastolic heart failure with 2D echo performed 11/04/2023 revealing normal LV systolic function.  He is on diuretics for lower extremity edema.  Chest pain of uncertain etiology Patient complains of exertional chest pain which is new since I saw him 7 months ago.  Going to get a coronary CTA to further evaluate.     Carrier DOROTHA Court MD FACP,FACC,FAHA, FSCAI 06/01/2024 3:42 PM    [1]  Current Meds  Medication Sig   acetaminophen  (TYLENOL ) 500 MG tablet Take 1,000 mg by mouth 3 (three) times daily.   anastrozole  (ARIMIDEX ) 1 MG tablet TAKE 1 TABLET BY MOUTH EVERY DAY   ascorbic acid  (VITAMIN C) 1000 MG tablet Take 1,000 mg by mouth every evening.   benzonatate  (TESSALON ) 100 MG capsule Take 1 capsule (100 mg total) by mouth every 8 (eight) hours.   Blood Pressure Monitoring (OMRON 3 SERIES BP MONITOR) DEVI Use to self monitor blood pressure.   botulinum toxin Type A  (BOTOX ) 200 units injection Provider to inject 155 units into the muscles of the head and neck every 12 weeks. Discard remainder.   BREZTRI  AEROSPHERE 160-9-4.8 MCG/ACT AERO inhaler SMARTSIG:2 Puff(s) Via Inhaler Morning-Night   budesonide  (PULMICORT ) 0.5 MG/2ML nebulizer solution INHALE CONTENTS OF 1 VIAL VIA NEBULIZER 2 (TWO) TIMES DAILY AS NEEDED.   Calcium  Carb-Cholecalciferol  (CALCIUM  + VITAMIN D3 PO) Take 1 tablet by mouth daily.   Carbinoxamine  Maleate 4 MG TABS Take 1 tablet (4 mg total) by mouth in the morning and at bedtime.   celecoxib  (CELEBREX ) 200 MG capsule TAKE 1 CAPSULE (200 MG TOTAL) BY MOUTH EVERY 12 (TWELVE) HOURS.   cetaphil (CETAPHIL) lotion Apply 1 Application topically 2 (two) times daily.   cetirizine  (ZYRTEC ) 10 MG tablet Take 1 tablet (10 mg total) by mouth daily.   Cholecalciferol  (VITAMIN D3) 125 MCG (5000 UT) TABS Take 5,000-10,000 Units by mouth See admin instructions. Take  10,000 in the morning and 5,000 units in the evening   Cinnamon  500 MG capsule Take 1,000 mg by mouth 3 (three) times daily.   Coenzyme Q10 (CO Q 10 PO) Take 600 mg by mouth daily.   cromolyn  (OPTICROM ) 4 % ophthalmic solution Place 2 drops into both eyes 4 (four) times daily as needed.   cyclobenzaprine  (FLEXERIL ) 10 MG tablet TAKE  1 TABLET 3 TIMES A DAY ONLY IF NEEDED FOR MUSCLE SPASM (NECK)   diclofenac  Sodium (VOLTAREN ) 1 % GEL Apply 2-4 g topically 2 (two) times daily as needed (pain).   donepezil  (ARICEPT ) 23 MG TABS tablet Take 1 tablet (23 mg total) by mouth daily.   dupilumab  (DUPIXENT ) 300 MG/2ML prefilled syringe Inject 600 mg into the skin once for 1 dose. THEN 300MG  EVERY 14 DAYS   EPINEPHrine  0.3 mg/0.3 mL IJ SOAJ injection Inject 0.3 mg into the muscle as needed for anaphylaxis.   furosemide  (LASIX ) 20 MG tablet Take 1 tablet (20 mg total) by mouth daily. Patient may take an additional 20 mg prn swelling (max 40 mg/day)   Guaifenesin  1200 MG TB12 Take 1,200 mg by mouth 3 (three) times daily.   hydrALAZINE  (APRESOLINE ) 25 MG tablet Take 1 tablet (25 mg total) by mouth 3 (three) times daily.   ipratropium (ATROVENT ) 0.03 % nasal spray Place 2 sprays into the nose 3 (three) times daily as needed for rhinitis.   ipratropium-albuterol  (DUONEB) 0.5-2.5 (3) MG/3ML SOLN Take 3 mLs by nebulization every 6 (six) hours as needed.   Krill Oil 500 MG CAPS Take 500 mg by mouth 2 (two) times daily.   Magnesium  500 MG CAPS Take 500 mg by mouth 3 (three) times daily.   memantine  (NAMENDA ) 10 MG tablet Take 1 tablet (10 mg total) by mouth 2 (two) times daily.   metFORMIN  (GLUCOPHAGE -XR) 500 MG 24 hr tablet TAKES 2 TABLETS TWICE A DAY WITH MEALS FOR DIABETES   metoprolol  tartrate (LOPRESSOR ) 25 MG tablet Take 1 tablet (25 mg total) by mouth in the morning, at noon, and at bedtime.   Misc Natural Products (GLUCOS-CHONDROIT-MSM COMPLEX) TABS Take 2 tablets by mouth 3 (three) times daily.   montelukast   (SINGULAIR ) 10 MG tablet Take 1 tablet (10 mg total) by mouth at bedtime.   Multiple Vitamins-Minerals (MULTIVITAMIN WITH MINERALS) tablet Take 1 tablet by mouth every morning.   Multiple Vitamins-Minerals (PRESERVISION AREDS 2) CAPS Take 1 capsule by mouth 2 (two) times daily. Evening and night   Olopatadine  HCl 0.6 % SOLN Place 2 sprays into the nose 2 (two) times daily as needed.   OXYGEN  Inhale 2 L into the lungs.   oxymetazoline  (AFRIN) 0.05 % nasal spray Place 1 spray into both nostrils at bedtime.   pantoprazole  (PROTONIX ) 40 MG tablet Take 1 tablet (40 mg total) by mouth daily.   pentosan polysulfate (ELMIRON ) 100 MG capsule Take 100 mg by mouth 3 (three) times daily.   potassium chloride  SA (KLOR-CON  M20) 20 MEQ tablet Take 1 tablet (20 mEq total) by mouth daily.   pravastatin  (PRAVACHOL ) 40 MG tablet TAKE 1 TABLET BY MOUTH EVERY DAY AT BEDTIME FOR CHOLESTEROL   predniSONE  (DELTASONE ) 20 MG tablet Take 2 tablets (40 mg total) by mouth daily with breakfast.   pregabalin  (LYRICA ) 150 MG capsule TAKE 1 CAPSULE 3 TIMES A DAY FOR CHRONIC PAIN   sertraline  (ZOLOFT ) 100 MG tablet Take 2 tablets (200 mg total) by mouth daily.   sodium chloride  (OCEAN) 0.65 % SOLN nasal spray Place 1 spray into both nostrils 4 (four) times daily as needed for congestion. Uses each time before other nasal sprays   tadalafil  (CIALIS ) 5 MG tablet Take 5 mg by mouth daily.    tamsulosin (FLOMAX) 0.4 MG CAPS capsule Take 0.4 mg by mouth at bedtime.   tirzepatide  (MOUNJARO ) 12.5 MG/0.5ML Pen Inject 12.5 mg into the skin once a week.  traZODone  (DESYREL ) 50 MG tablet TAKE 1 TABLET BY MOUTH EVERYDAY AT BEDTIME   triamcinolone  ointment (KENALOG ) 0.1 % Apply 1 Application topically 2 (two) times daily.   trospium (SANCTURA) 20 MG tablet Take 20 mg by mouth 2 (two) times daily. In the evening and at bedtime   TURMERIC PO Take 2,000 mg by mouth 3 (three) times daily. 1000 mg each   valsartan  (DIOVAN ) 160 MG tablet Take 1  tablet (160 mg total) by mouth 2 (two) times daily.   VENTOLIN  HFA 108 (90 Base) MCG/ACT inhaler INHALE 1-2 PUFFS BY MOUTH EVERY 6 HOURS AS NEEDED FOR WHEEZE OR SHORTNESS OF BREATH   zinc  gluconate 50 MG tablet Take 50 mg by mouth 3 (three) times daily.  [2]  Allergies Allergen Reactions   Bee Venom Swelling   Cymbalta [Duloxetine Hcl] Shortness Of Breath    Brought on asthma   Ppd [Tuberculin Purified Protein Derivative] Other (See Comments)    +ppd NEG Quantferron Gold 3/13 (shows false positive)    Calan [Verapamil] Other (See Comments)    Back pain   Tricor [Fenofibrate] Other (See Comments)    Back pain   Claritin  [Loratadine ] Other (See Comments)    Unknown reaction    Levaquin  [Levofloxacin ] Diarrhea   Tuberculin, Ppd Other (See Comments)    +ppd NEG Quantferron Gold 3/13   "

## 2024-06-01 NOTE — Assessment & Plan Note (Signed)
 Chronic diastolic heart failure with 2D echo performed 11/04/2023 revealing normal LV systolic function.  He is on diuretics for lower extremity edema.

## 2024-06-01 NOTE — Patient Instructions (Addendum)
 Medication Instructions:  Your physician has recommended you make the following change in your medication:   -Change metoprolol  tartrate (Lopressor ) to 50mg  twice daily.  -Increase hydralazine  (apresoline ) to 50mg  three times daily.  *If you need a refill on your cardiac medications before your next appointment, please call your pharmacy*  Lab Work: Today- BMET  If you have labs (blood work) drawn today and your tests are completely normal, you will receive your results only by: MyChart Message (if you have MyChart) OR A paper copy in the mail If you have any lab test that is abnormal or we need to change your treatment, we will call you to review the results.  Testing/Procedures: See below  Follow-Up: At [facility], you and your health needs are our priority.  As part of our continuing mission to provide you with exceptional heart care, our providers are all part of one team.  This team includes your primary Cardiologist (physician) and Advanced Practice Providers or APPs (Physician Assistants and Nurse Practitioners) who all work together to provide you with the care you need, when you need it.  Your next appointment:   6 month(s)  Provider:   Lum Louis, NP, Rollo Louder, PA-C, Hao Meng, PA-C, Damien Braver, NP, or Katlyn West, NP         Then, Dorn Lesches, MD will plan to see you again in 12 month(s).   We recommend signing up for the patient portal called MyChart.  Sign up information is provided on this After Visit Summary.  MyChart is used to connect with patients for Virtual Visits (Telemedicine).  Patients are able to view lab/test results, encounter notes, upcoming appointments, etc.  Non-urgent messages can be sent to your provider as well.   To learn more about what you can do with MyChart, go to [email or web address].   Other Instructions Dr. Lesches has requested that you schedule an appointment with one of our clinical pharmacists for a  blood pressure check appointment within the next 6-8 weeks.  If you monitor your blood pressure (BP) at home, please bring your BP cuff and your BP readings with you to this appointment  HOW TO TAKE YOUR BLOOD PRESSURE: Rest 5 minutes before taking your blood pressure. Dont smoke or drink caffeinated beverages for at least 30 minutes before. Take your blood pressure before (not after) you eat. Sit comfortably with your back supported and both feet on the floor (dont cross your legs). Elevate your arm to heart level on a table or a desk. Use the proper sized cuff. It should fit smoothly and snugly around your bare upper arm. There should be enough room to slip a fingertip under the cuff. The bottom edge of the cuff should be 1 inch above the crease of the elbow. Ideally, take 3 measurements at one sitting and record the average.     Your cardiac CT will be scheduled at the below location:    [facility] [address]  [place], [place] [address]    Please follow these instructions carefully (unless otherwise directed):  An IV will be required for this test and Nitroglycerin will be given.  Hold all erectile dysfunction medications at least 3 days (72 hrs) prior to test. (Ie viagra, cialis , sildenafil, tadalafil , etc)   On the Night Before the Test: Be sure to Drink plenty of water. Do not consume any caffeinated/decaffeinated beverages or chocolate 12 hours prior to your test. Do not take any antihistamines 12  hours prior to your test.  On the Day of the Test: Drink plenty of water until 1 hour prior to the test. Do not eat any food 1 hour prior to test. You may take your regular medications prior to the test.  Take metoprolol  (Lopressor ) 25mg  two hours prior to test. If you take Furosemide /Hydrochlorothiazide /Spironolactone/Chlorthalidone, please HOLD on the morning of the test. Patients who wear a continuous glucose monitor MUST remove the device  prior to scanning.      After the Test: Drink plenty of water. After receiving IV contrast, you may experience a mild flushed feeling. This is normal. On occasion, you may experience a mild rash up to 24 hours after the test. This is not dangerous. If this occurs, you can take Benadryl 25 mg, Zyrtec , Claritin , or Allegra and increase your fluid intake. (Patients taking Tikosyn should avoid Benadryl, and may take Zyrtec , Claritin , or Allegra) If you experience trouble breathing, this can be serious. If it is severe call 911 IMMEDIATELY. If it is mild, please call our office.  We will call to schedule your test 2-4 weeks out understanding that some insurance companies will need an authorization prior to the service being performed.   For more information and frequently asked questions, please visit our website : [email or web address]  For non-scheduling related questions, please contact the cardiac imaging nurse navigator should you have any questions/concerns: Cardiac Imaging Nurse Navigators Direct Office Dial: [phone]   For scheduling needs, including cancellations and rescheduling, please call Brittany, [phone].

## 2024-06-01 NOTE — Assessment & Plan Note (Signed)
 History of essential hypertension blood pressure measured today at 180/98.  He is on valsartan , metoprolol  and hydralazine  which I am going to adjust.  Am going to have him keep a blood pressure log for 30 days and have her see Pharm.D. after that to review and make appropriate changes.

## 2024-06-01 NOTE — Assessment & Plan Note (Signed)
 History of hyperlipidemia on statin therapy with lipid profile performed 12//24 revealing total cholesterol 54, LDL of 83 and HDL 34.

## 2024-06-02 ENCOUNTER — Ambulatory Visit: Payer: Self-pay | Admitting: Cardiovascular Disease

## 2024-06-02 LAB — BASIC METABOLIC PANEL WITH GFR
BUN/Creatinine Ratio: 17 (ref 10–24)
BUN: 14 mg/dL (ref 8–27)
CO2: 25 mmol/L (ref 20–29)
Calcium: 10.3 mg/dL — ABNORMAL HIGH (ref 8.6–10.2)
Chloride: 99 mmol/L (ref 96–106)
Creatinine, Ser: 0.82 mg/dL (ref 0.76–1.27)
Glucose: 76 mg/dL (ref 70–99)
Potassium: 3.9 mmol/L (ref 3.5–5.2)
Sodium: 141 mmol/L (ref 134–144)
eGFR: 99 mL/min/1.73

## 2024-06-03 ENCOUNTER — Other Ambulatory Visit: Payer: Self-pay

## 2024-06-05 ENCOUNTER — Encounter (INDEPENDENT_AMBULATORY_CARE_PROVIDER_SITE_OTHER): Payer: Self-pay

## 2024-06-06 ENCOUNTER — Other Ambulatory Visit (HOSPITAL_COMMUNITY): Payer: Self-pay

## 2024-06-07 ENCOUNTER — Other Ambulatory Visit: Payer: Self-pay | Admitting: Family Medicine

## 2024-06-07 ENCOUNTER — Other Ambulatory Visit: Payer: Self-pay

## 2024-06-07 ENCOUNTER — Ambulatory Visit (HOSPITAL_BASED_OUTPATIENT_CLINIC_OR_DEPARTMENT_OTHER): Admitting: Physical Therapy

## 2024-06-07 ENCOUNTER — Encounter (HOSPITAL_BASED_OUTPATIENT_CLINIC_OR_DEPARTMENT_OTHER): Payer: Self-pay | Admitting: Physical Therapy

## 2024-06-07 ENCOUNTER — Other Ambulatory Visit (HOSPITAL_COMMUNITY): Payer: Self-pay

## 2024-06-07 DIAGNOSIS — M6281 Muscle weakness (generalized): Secondary | ICD-10-CM

## 2024-06-07 DIAGNOSIS — G8929 Other chronic pain: Secondary | ICD-10-CM

## 2024-06-07 DIAGNOSIS — R2689 Other abnormalities of gait and mobility: Secondary | ICD-10-CM

## 2024-06-07 DIAGNOSIS — M25561 Pain in right knee: Secondary | ICD-10-CM | POA: Diagnosis not present

## 2024-06-07 NOTE — Therapy (Signed)
 " OUTPATIENT PHYSICAL THERAPY LOWER EXTREMITY TREATMENT       Patient Name: Mark Harrell. MRN: [id number] DOB:[birth year], 64 y.o., male Today's Date: 06/07/2024  END OF SESSION:  PT End of Session - 06/07/24 1410     Visit Number 12    Date for Recertification  06/24/24    Authorization Type [employer]    Authorization Time Period visit #1/9 visits approved  From 01.05.2026 - 03.05.2026    Authorization - Visit Number 2    Authorization - Number of Visits 9    Progress Note Due on Visit 17    PT Start Time 1400    PT Stop Time 1440    PT Time Calculation (min) 40 min    Activity Tolerance Patient tolerated treatment well    Behavior During Therapy WFL for tasks assessed/performed             Past Medical History:  Diagnosis Date   Allergy    Anesthesia complication requiring reversal agent administration    ? from central apnea, very difficult to get off vent   Anxiety    Arthritis    osteo   Asthma    BPH (benign prostatic hyperplasia)    Cataract    CHF (congestive heart failure) (HCC)    Complication of anesthesia    difficulty waking , they twlight me because of my respiratory problems    COPD (chronic obstructive pulmonary disease) (HCC)    COVID    moderate case 2023   Depression    Diabetes mellitus (HCC) 08/31/2023   DVT (deep venous thrombosis) (HCC) 2023   right leg   Dyspnea    on exertion   Enlarged heart    Family history of adverse reaction to anesthesia    mother trouble waking up, and heart stopped   GERD (gastroesophageal reflux disease)    Headache    botox  injections for headaches   Hyperlipidemia    Hypertension    Hypogonadism male    IBS (irritable bowel syndrome)    Kidney stones    Memory difficulties    short term memories   Neuromuscular disorder (HCC)    Neuropathy    Obesity    On home oxygen  therapy    on 2 liter   OSA (obstructive sleep apnea)    not using cpap   Oxygen  deficiency    Paralysis (HCC)    left hand  - small    due to accident with an head injury   Pneumonia    Pre-diabetes    Prostatitis    Sleep apnea    Ulcer    Past Surgical History:  Procedure Laterality Date   ABDOMINAL SURGERY     ANKLE FRACTURE SURGERY Right    ANTERIOR CERVICAL DECOMP/DISCECTOMY FUSION N/A 05/23/2019   Procedure: ANTERIOR CERVICAL DISCECTOMY FUSION CERVICAL FIVE THROUGH CERVICAL SIX AND CERVICAL SIX THROUGH CERVICAL SEVEN;  Surgeon: Lucilla Lynwood BRAVO, MD;  Location: [facility] OR;  Service: Orthopedics;  Laterality: N/A;   BRONCHIAL BRUSHINGS  04/28/2022   Procedure: BRONCHIAL BRUSHINGS;  Surgeon: Shelah Lamar RAMAN, MD;  Location: [facility] ENDOSCOPY;  Service: Pulmonary;;   BRONCHIAL NEEDLE ASPIRATION BIOPSY  04/28/2022   Procedure: BRONCHIAL NEEDLE ASPIRATION BIOPSIES;  Surgeon: Shelah Lamar RAMAN, MD;  Location: [facility] ENDOSCOPY;  Service: Pulmonary;;   COLONOSCOPY     CYSTOSCOPY     Tannebaum   FRACTURE SURGERY     KNEE ARTHROSCOPY WITH MEDIAL MENISECTOMY Left 01/02/2017   Procedure: LEFT KNEE  ARTHROSCOPY WITH PARTIAL MEDIAL MENISCECTOMY;  Surgeon: Vernetta Lonni GRADE, MD;  Location: [facility] ORS;  Service: Orthopedics;  Laterality: Left;   POSTERIOR CERVICAL FUSION/FORAMINOTOMY N/A 08/20/2020   Procedure: LEFT CERVICAL SIX THROUGH SEVEN  AND CERVICAL SEVEN THROUGH THORACIC ONE  FORAMINOTOMIES WITH EXCISION OF DISC HERNIATION LEFT CERVICAL SEVEN THROUGH THORACIC ONE;  Surgeon: Lucilla Lynwood BRAVO, MD;  Location: [facility] OR;  Service: Orthopedics;  Laterality: N/A;   SPINE SURGERY     TONSILLECTOMY     TURBINATE RESECTION  2007   UVULOPALATOPHARYNGOPLASTY     VIDEO BRONCHOSCOPY WITH RADIAL ENDOBRONCHIAL ULTRASOUND  04/28/2022   Procedure: VIDEO BRONCHOSCOPY WITH RADIAL ENDOBRONCHIAL ULTRASOUND;  Surgeon: Shelah Lamar RAMAN, MD;  Location: [facility] ENDOSCOPY;  Service: Pulmonary;;   Patient Active Problem List   Diagnosis Date Noted   Chest pain of uncertain etiology 06/01/2024   COPD exacerbation (HCC) 04/26/2024   Hymenoptera reaction  03/25/2024   COVID-19 02/22/2024   Acute on chronic diastolic heart failure (HCC) 09/30/2023   Diabetes mellitus (HCC) 08/31/2023   Rash 08/31/2023   Cognitive changes 08/31/2023   Unilateral primary osteoarthritis, left knee 02/24/2022   Unilateral primary osteoarthritis, right knee 02/24/2022   Pulmonary nodules 01/28/2022   History of adenomatous polyp of colon 01/08/2022   Hypokalemia 09/26/2021   Dyspnea 09/25/2021   Acute deep vein thrombosis (DVT) of right lower extremity (HCC) 09/24/2021   OSA (obstructive sleep apnea) 09/24/2021   Herniation of cervical intervertebral disc with radiculopathy    Status post cervical discectomy 08/20/2020   Tracheomalacia 05/26/2019   Acquired tracheomalacia 05/26/2019   HNP (herniated nucleus pulposus) with myelopathy, cervical 05/23/2019    Class: Chronic   Spinal stenosis of cervical region 05/23/2019    Class: Chronic   Status post cervical spinal fusion 05/23/2019   Chronic respiratory failure with hypoxia (HCC) 04/12/2018   Bilateral lower extremity edema 12/29/2017   Hymenoptera allergy 11/10/2017   Family history of colonic polyps 08/07/2017   Diverticulosis 08/07/2017   Flatus 08/07/2017   Myofascial pain 08/06/2017   Seasonal and perennial allergic rhinitis 04/23/2017   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/2017   Cervicalgia 04/01/2017   Recurrent pneumonia 03/09/2017   Other spondylosis with radiculopathy, cervical region 02/09/2017   Depression with anxiety 01/04/2017   Memory difficulty 12/05/2016   Diarrhea in adult patient 12/03/2016   Internal hemorrhoids 12/03/2016   Chronic migraine without aura without status migrainosus, not intractable 10/30/2016   Lumbar radiculopathy 05/15/2016   Osteoarthritis of spine with radiculopathy, lumbar region 05/15/2016   Risk for falls 05/15/2016   Recurrent infections 03/29/2016   Chronic nonseasonal allergic rhinitis due to pollen 03/29/2016   Polypharmacy 01/16/2016   Morbid obesity with  BMI of 45.0-49.9, adult (HCC) 03/06/2015   SDAT 02/05/2015   OSA and COPD overlap syndrome (HCC) 02/05/2015   Medication management 08/02/2014   GERD (gastroesophageal reflux disease) 05/09/2014   Vitamin D  deficiency 08/01/2013   Positive TB test 07/29/2011   Diverticula of colon 05/07/2011   Hypertension 01/31/2011   Hyperlipidemia, mixed 01/31/2011   BPH (benign prostatic hyperplasia) 01/31/2011   Testosterone  Deficiency 01/31/2011   IBS (irritable bowel syndrome) 01/31/2011   Partial complex seizure disorder with intractable epilepsy (HCC) 01/31/2011   Depression, major, recurrent, in partial remission (HCC) 01/31/2011   Asthma-COPD overlap syndrome (HCC) 01/31/2011    PCP: Quintin Crane MD  REFERRING PROVIDER: Vernetta Lonni GRADE, MD   REFERRING DIAG:  [phone] (ICD-10-CM) - Chronic pain of left knee  M25.561,G89.29 (ICD-10-CM) - Chronic pain of right  knee    THERAPY DIAG:  Bilateral chronic knee pain  Muscle weakness (generalized)  Other abnormalities of gait and mobility  Rationale for Evaluation and Treatment: Rehabilitation  ONSET DATE: >5 yrs  SUBJECTIVE:   SUBJECTIVE STATEMENT: Pt reports falling in the shower after last session at home.  Stayed on floor for about 1 hour before struggling and getting up by himself.  He reports high pain in both knees, LB hips, elbows and wrists. 7/10  Initial Subjective OA bilateral knees.  Not a surgical candidate at this time due to elevated BMI. Received steroid injections 01/18/24. Twisted my R knee the other day have basically stayed off of it. R knee hurting > left today usually the other way around. TBI 1985 with left sided le weakness; Had a fall in 2019 hurt my neck was paralyzed waist down resolved on its own, then had a cervical fusion 2020.  Pt reports he does not do a lot of walking as he feels unsafe.   PERTINENT HISTORY: 2020: cervical decompression discectomy/fusion Acute on chronic diastolic  heart failure Closed head injury 1985 with residual left side weakness (left leg drags) PAIN:  Are you having pain? Yes: NPRS scale: see below Pain location: knees 4/10, neck 6-7/10 Pain description: achy; sharp Aggravating factors: walking; standing Relieving factors: sitting; elevation; ice  PRECAUTIONS: None  RED FLAGS: None   WEIGHT BEARING RESTRICTIONS: No  FALLS:  Has patient fallen in last 6 months? 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  DIAGNOSTIC FINDINGS:  L knee x-ray 2023: 2 views of the left knee show tricompartment arthritis mainly involving  the medial compartment of the knee.  This is moderate arthritis.   R knee x-ray 2023 2 views of the right knee show no significant malalignment.  There is mild  tricompartment arthritic changes.   PATIENT SURVEYS:  Lefs: 22/80 04/29/24:21/80  COGNITION: Overall cognitive status: Within functional limits for tasks assessed     SENSATION: WFL  EDEMA:  Minimal right knee  MUSCLE LENGTH: Hamstrings: tight bilaterally tested in sitting  POSTURE: rounded shoulders, forward head, and slight knee varus bilaterally  PALPATION: TTP medial joint line left knee  LOWER EXTREMITY ROM:  Active ROM Right eval Left eval Right 04/29/24  Hip flexion     Hip extension     Hip abduction     Hip adduction     Hip internal rotation     Hip external rotation     Knee flexion 105 130 130  Knee extension 0 0 0  Ankle dorsiflexion     Ankle  plantarflexion     Ankle inversion     Ankle eversion      (Blank rows = not tested)  LOWER EXTREMITY MMT:  MMT Right eval Left eval Right / L 05/04/24  Hip flexion 20.5 32.9 34.5  Hip extension     Hip abduction     Hip adduction     Hip internal rotation     Hip external rotation     Knee flexion 4/5 53.0  Knee extension 5/5 33.8 43.2 / 36.4  Ankle dorsiflexion 5 5   Ankle plantarflexion     Ankle inversion     Ankle eversion      (Blank rows = not tested)  FUNCTIONAL TESTS:  Timed up and go (TUG): 28.67  4 stage balance: Passed 1&2.  Tandem and SLS unable 04/29/24: 18.58 sec with SPC  GAIT: Distance walked: 400 ft Assistive device utilized: Single point cane Level of assistance: Complete Independence Comments: Pt pauses upon standing then initiates gait. Knee flex                                                                                                                                 TREATMENT  [facility] Adult PT Treatment:                                                DATE: 06/07/24 Pt seen for aquatic therapy today.  Treatment took place in water 3.5-4.75 ft in depth at the [facility] pool. Temp of water was 91.  Pt entered/exited the pool via stairs using step to pattern with hand rail.  * walking forward/ backward /side with reciprocal arm swing * side stepping blue HB add/abd * Bow & arrow *Open book *vertical decompression using white noodle: cycling; hip add/abd *suspended supine: hip extension *walking forward, back and side stepping  Pt requires the buoyancy and hydrostatic pressure of water for support, and to offload joints by unweighting joint load by at least 50 % in navel deep water and by at least 75-80% in chest to neck deep water.  Viscosity of the water is needed for resistance of strengthening. Water current perturbations provides challenge to standing balance requiring increased core activation.       PATIENT EDUCATION:   Education details: intro to aquatic therapy   Person educated: Patient Education method: Programmer, Multimedia, demo,  Education comprehension: verbalized understanding, return demo  HOME EXERCISE PROGRAM: tba  ASSESSMENT:  CLINICAL IMPRESSION: Pt had fall in shower after last session.  He has minor bruising which is resolving on elbows and c/o increased knee  (new popliteal fossa left knee), UE and back pain.  Spent majority of session stretching and completing gentle movement patterns for pain management. Cues to work into discomfort slightly to increase range as tolerated.  He reports some reduction in pain.  He has MD appt tomorrow and will inform of accident. Pt edu on using some form of phone or watch while in shower so he can call for assistance in future if needed. He VU. Goals ongoing.     PN: see note below. Testing demonstrates improved right hip strength meeting LTG.  He continues to have reduced pain sensitivity since injections reporting increased ease with functional mobility. He will continue to benefit from skilled  PT aquatic intervention to progress towards previous and new stated goals. May consider adding land based intervention going forward. Last session: It has been 1 month since patient's last session due to scheduling difficulties.  He reports reduction in knee pain since recent injections.  Pt has reported some improvements in mobility since starting therapy.  His TUG time improved by 10 sec. Pt reported good toleration to exercises performed in water today. Pt has met STG3 and LTG 4.  Therapist to assess remaining goals next visit for recert.    OBJECTIVE IMPAIRMENTS: Abnormal gait, decreased activity tolerance, decreased balance, decreased knowledge of use of DME, decreased mobility, difficulty walking, decreased ROM, decreased strength, obesity, and pain.   ACTIVITY LIMITATIONS: carrying, lifting, sitting, squatting, stairs, transfers, and locomotion level  PARTICIPATION  LIMITATIONS: meal prep, cleaning, laundry, shopping, community activity, and yard work  PERSONAL FACTORS: Fitness and 3+ comorbidities: see PmHx are also affecting patient's functional outcome.   REHAB POTENTIAL: Good  CLINICAL DECISION MAKING: Stable/uncomplicated  EVALUATION COMPLEXITY: Low   GOALS: Goals reviewed with patient? No  SHORT TERM GOALS: Target date: 03/26/24 Pt will tolerate full aquatic sessions consistently without increase in pain and with improving function to demonstrate good toleration and effectiveness of intervention.  Baseline: Goal status: Met 03/30/24  2.  Pt will tolerate stair climbing using alternating or combination of alternating and step to pattern ascending and descending 6 steps with use of handrail  Baseline:  Goal status: Met 03/30/24  3.  Pt will consider gaining pool access for use of the properties of water for chronic conditions maintaining mobility and minimizing pain. Baseline: has [employer]; hasn't been there yet Goal status: MET - 04/29/24    LONG TERM GOALS: Target date: 06/24/24  Pt to improve on LEFS by at least 9 point to demonstrate statistically significant Improvement in function. Baseline: see above - 04/29/24 Goal status: In progress   2. Pt will tolerate walking to and from setting and engaging in aquatic therapy session without excessive fatigue or increase in pain to demonstrate improved toleration to activity.  Baseline: improving; varies day to day Goal status:in progress - 04/29/24  3.  Pt will improve strength in RLE within 5 lbs of left to demonstrate improved overall physical function Baseline:  Goal status: Met 05/04/24  4.  Pt will improve ROM of  R knee flex to within 5d of left to improve function, decrease risk of injury and pain and improve quality of gait Baseline: See chart Goal status: MET- 04/29/24  5.  Pt will be indep with final HEP's (land and aquatic as appropriate) for continued management of  condition Baseline:  Goal status: INITIAL   6. Pt will perform Tandem and SLS in 3.6 ft unsupported x 15s to demonstrate improved balance   Baseline: see above   Goals Status: NEW   7. Pt will perform 10 consecutive STS from water bench onto water step to demonstrate improved immediate standing balance and strength.   Baseline:   Goal Status: NEW   PLAN:  PT FREQUENCY: 1-2x/week  PT DURATION: 8 week  PLANNED INTERVENTIONS: [id number]- PT Re-evaluation, 97750- Physical Performance Testing, 97110-Therapeutic exercises, 97530- Therapeutic activity, [id number]- Neuromuscular re-education, 97535- Self Care, [id number]- Manual therapy, [id number]- Gait training, [phone]- Aquatic Therapy,  [id number] (1-2 muscles), [id number] (3+ muscles)- Dry Needling, Patient/Family education, Balance training, Stair training, Taping, Joint mobilization, DME instructions, Cryotherapy, and Moist heat  PLAN FOR NEXT SESSION: Aquatic: general strengthening with focus on knees and core; balance and  proprioceptive retraining; stair climbing Land same as [employer].  [address] [place]) Samaj Wessells MPT 06/07/2024 2:15 PM [facility] [address] [place], [place], [address] Phone: [phone]   Fax:  [phone]   "

## 2024-06-08 ENCOUNTER — Other Ambulatory Visit: Payer: Self-pay | Admitting: Orthopedic Surgery

## 2024-06-08 ENCOUNTER — Other Ambulatory Visit (INDEPENDENT_AMBULATORY_CARE_PROVIDER_SITE_OTHER)

## 2024-06-08 ENCOUNTER — Ambulatory Visit: Admitting: Orthopedic Surgery

## 2024-06-08 VITALS — BP 131/81 | HR 81 | Ht 72.0 in | Wt 349.0 lb

## 2024-06-08 DIAGNOSIS — M542 Cervicalgia: Secondary | ICD-10-CM

## 2024-06-08 DIAGNOSIS — M545 Low back pain, unspecified: Secondary | ICD-10-CM

## 2024-06-08 NOTE — Progress Notes (Signed)
 Orthopedic Spine Surgery Office Note  Assessment: Patient is a 64 y.o. male with chronic low back, thoracic spine, and neck pain. Has had chronic left arm pain. No changes in symptoms for the last several years   Plan: -Since patient's symptoms are stable, no neurologic deficits on exam, no lesions on prior advanced imaging of the cervical and thoracic spine, I did not recommend any further advanced imaging at this time. He did not do well with surgery for his radiating left arm pain after Dr. Lucilla did a anterior and then posterior cervical spine surgery, I am not further surgery is a good solution. He has also had symptoms for over 5 years now so the likelihood of recovery is decreased with further surgery. His main complaints are also his neck, thoracic, and lumbar back pain. Surgery is not predictable at relieving axial spine pain. I talked about pain management as an option. He was interested in this as an option, so a referral was provided to him today -Patient would need to get to a BMI of 40 or less prior offering any elective spine surgery -Patient should return to office on an as needed basis    Patient expressed understanding of the plan and all questions were answered to the patient's satisfaction.   ___________________________________________________________________________   History:  Patient is a 64 y.o. male who presents today for chronic cervical, thoracic, and lumbar spine pain. He has had this pain for many years now. It has not changed over the years. He has no radiating leg pain. He does have radiating left arm pain. He feels it going into his left lateral arm and dorsal forearm. He had these surgeries prior to his anterior and later posterior cervical spine surgeries with Dr. Lucilla. He did not notice any improvement with either of these surgeries. He has had this arm pain now for over 5 years. His arm pain is manageable. It is his chronic cervical, thoracic, and lumbar back  pain that is is more bothersome. There was no trauma or injury that preceded the onset of the pain. No bowel or bladder incontinence. No saddle anesthesia. His most painful area is his cervical spine.    Treatments tried: PT, tylenol , heat, voltaren  gel, steroid injections  Review of systems: Denies fevers and chills, night sweats, unexplained weight loss, history of cancer, pain that wakes them at night  Past medical history: COPD Depression/anxiety GERD DM (last A1c was 5.2 on 04/26/2024) CHF OSA HTN HLD IBS BPH  Allergies: cymbalta, verapamil, fenofibrate, calritin, levaquin   Past surgical history:  Right ankle fracture ORIF C5-7 ACDF Left knee partial meniscectomy Tonsillectomy Left C6/7 and C7/T1 foraminotomies Turbinate resection  Social history: Denies use of nicotine product (smoking, vaping, patches, smokeless) Alcohol use: rare Denies recreational drug use   Physical Exam:  BMI of 47.3  General: no acute distress, appears stated age Neurologic: alert, answering questions appropriately, following commands Respiratory: unlabored breathing on room air, symmetric chest rise Psychiatric: appropriate affect, normal cadence to speech   MSK (spine):  -Strength exam      Left  Right Grip strength                5/5  5/5 Interosseus   5/5   5/5 Wrist extension  5/5  5/5 Wrist flexion   5/5  5/5 Elbow flexion   5/5  5/5 Deltoid    5/5  5/5  EHL    5/5  5/5 TA    5/5  5/5 GSC  5/5  5/5 Knee extension  5/5  5/5 Hip flexion   5/5  5/5  -Sensory exam   Sensation intact to light touch in C5-T1 nerve distributions of bilateral upper extremities  Sensation intact to light touch in L3-S1 nerve distributions bilaterally  -Brachioradialis DTR: 1/4 on the left, 1/4 on the right -Biceps DTR: 1/4 on the left, 1/4 on the right -Patellar DTR: 1/4 on the left, 1/4 on the right -Achilles DTR: 1/4 on the left, 1/4 on the right  -Hoffman sign: negative  bilaterally -Clonus: no beats bilaterally -Interosseous wasting: none seen -Grip and release test: negative  -Romberg: negative  -Negative SLR bilaterally -Negative spurling bilaterally   Imaging: XRs of the cervical spine from 06/08/2024 were independently reviewed and interpreted, showing disc height loss with anterior osteophyte formation at C4/5. Anterior instrumentation from C5-7. No lucency seen around the screws. There appears to be fusion mass spanning the former disc spaces. No fracture or dislocation seen. No evidence of instability on flexion/extension views.    Patient name: Mark Harrell. Patient MRN: [id number] Date of visit: 06/08/2024

## 2024-06-14 ENCOUNTER — Ambulatory Visit (HOSPITAL_BASED_OUTPATIENT_CLINIC_OR_DEPARTMENT_OTHER): Admitting: Physical Therapy

## 2024-06-14 ENCOUNTER — Other Ambulatory Visit: Payer: Self-pay | Admitting: Allergy & Immunology

## 2024-06-14 ENCOUNTER — Encounter (HOSPITAL_BASED_OUTPATIENT_CLINIC_OR_DEPARTMENT_OTHER): Payer: Self-pay | Admitting: Physical Therapy

## 2024-06-14 DIAGNOSIS — G8929 Other chronic pain: Secondary | ICD-10-CM

## 2024-06-14 DIAGNOSIS — M6281 Muscle weakness (generalized): Secondary | ICD-10-CM

## 2024-06-14 DIAGNOSIS — R2689 Other abnormalities of gait and mobility: Secondary | ICD-10-CM

## 2024-06-14 DIAGNOSIS — M25561 Pain in right knee: Secondary | ICD-10-CM | POA: Diagnosis not present

## 2024-06-14 NOTE — Therapy (Signed)
 " OUTPATIENT PHYSICAL THERAPY LOWER EXTREMITY TREATMENT   Patient Name: Mark Harrell. MRN: [id number] DOB:1960/12/10, 64 y.o., male Today's Date: 06/14/2024  END OF SESSION:  PT End of Session - 06/14/24 1408     Visit Number 13    Date for Recertification  06/24/24    Authorization Type [employer]    Authorization Time Period visits approved  From 01.05.2026 - 03.05.2026    Authorization - Visit Number 3    Authorization - Number of Visits 9    Progress Note Due on Visit 17    PT Start Time 1400    PT Stop Time 1440    PT Time Calculation (min) 40 min    Activity Tolerance Patient tolerated treatment well    Behavior During Therapy WFL for tasks assessed/performed             Past Medical History:  Diagnosis Date   Allergy    Anesthesia complication requiring reversal agent administration    ? from central apnea, very difficult to get off vent   Anxiety    Arthritis    osteo   Asthma    BPH (benign prostatic hyperplasia)    Cataract    CHF (congestive heart failure) (HCC)    Complication of anesthesia    difficulty waking , they twlight me because of my respiratory problems    COPD (chronic obstructive pulmonary disease) (HCC)    COVID    moderate case 2023   Depression    Diabetes mellitus (HCC) 08/31/2023   DVT (deep venous thrombosis) (HCC) 2023   right leg   Dyspnea    on exertion   Enlarged heart    Family history of adverse reaction to anesthesia    mother trouble waking up, and heart stopped   GERD (gastroesophageal reflux disease)    Headache    botox  injections for headaches   Hyperlipidemia    Hypertension    Hypogonadism male    IBS (irritable bowel syndrome)    Kidney stones    Memory difficulties    short term memories   Neuromuscular disorder (HCC)    Neuropathy    Obesity    On home oxygen  therapy    on 2 liter   OSA (obstructive sleep apnea)    not using cpap   Oxygen  deficiency    Paralysis (HCC)    left hand - small    due to  accident with an head injury   Pneumonia    Pre-diabetes    Prostatitis    Sleep apnea    Ulcer    Past Surgical History:  Procedure Laterality Date   ABDOMINAL SURGERY     ANKLE FRACTURE SURGERY Right    ANTERIOR CERVICAL DECOMP/DISCECTOMY FUSION N/A 05/23/2019   Procedure: ANTERIOR CERVICAL DISCECTOMY FUSION CERVICAL FIVE THROUGH CERVICAL SIX AND CERVICAL SIX THROUGH CERVICAL SEVEN;  Surgeon: Lucilla Lynwood BRAVO, MD;  Location: [facility] OR;  Service: Orthopedics;  Laterality: N/A;   BRONCHIAL BRUSHINGS  04/28/2022   Procedure: BRONCHIAL BRUSHINGS;  Surgeon: Shelah Lamar RAMAN, MD;  Location: [facility] ENDOSCOPY;  Service: Pulmonary;;   BRONCHIAL NEEDLE ASPIRATION BIOPSY  04/28/2022   Procedure: BRONCHIAL NEEDLE ASPIRATION BIOPSIES;  Surgeon: Shelah Lamar RAMAN, MD;  Location: [facility] ENDOSCOPY;  Service: Pulmonary;;   COLONOSCOPY     CYSTOSCOPY     Tannebaum   FRACTURE SURGERY     KNEE ARTHROSCOPY WITH MEDIAL MENISECTOMY Left 01/02/2017   Procedure: LEFT KNEE ARTHROSCOPY WITH PARTIAL MEDIAL MENISCECTOMY;  Surgeon: Vernetta Lonni GRADE, MD;  Location: [facility] ORS;  Service: Orthopedics;  Laterality: Left;   POSTERIOR CERVICAL FUSION/FORAMINOTOMY N/A 08/20/2020   Procedure: LEFT CERVICAL SIX THROUGH SEVEN  AND CERVICAL SEVEN THROUGH THORACIC ONE  FORAMINOTOMIES WITH EXCISION OF DISC HERNIATION LEFT CERVICAL SEVEN THROUGH THORACIC ONE;  Surgeon: Lucilla Lynwood BRAVO, MD;  Location: [facility] OR;  Service: Orthopedics;  Laterality: N/A;   SPINE SURGERY     TONSILLECTOMY     TURBINATE RESECTION  2007   UVULOPALATOPHARYNGOPLASTY     VIDEO BRONCHOSCOPY WITH RADIAL ENDOBRONCHIAL ULTRASOUND  04/28/2022   Procedure: VIDEO BRONCHOSCOPY WITH RADIAL ENDOBRONCHIAL ULTRASOUND;  Surgeon: Shelah Lamar RAMAN, MD;  Location: [facility] ENDOSCOPY;  Service: Pulmonary;;   Patient Active Problem List   Diagnosis Date Noted   Chest pain of uncertain etiology 06/01/2024   COPD exacerbation (HCC) 04/26/2024   Hymenoptera reaction 03/25/2024   COVID-19  02/22/2024   Acute on chronic diastolic heart failure (HCC) 09/30/2023   Diabetes mellitus (HCC) 08/31/2023   Rash 08/31/2023   Cognitive changes 08/31/2023   Unilateral primary osteoarthritis, left knee 02/24/2022   Unilateral primary osteoarthritis, right knee 02/24/2022   Pulmonary nodules 01/28/2022   History of adenomatous polyp of colon 01/08/2022   Hypokalemia 09/26/2021   Dyspnea 09/25/2021   Acute deep vein thrombosis (DVT) of right lower extremity (HCC) 09/24/2021   OSA (obstructive sleep apnea) 09/24/2021   Herniation of cervical intervertebral disc with radiculopathy    Status post cervical discectomy 08/20/2020   Tracheomalacia 05/26/2019   Acquired tracheomalacia 05/26/2019   HNP (herniated nucleus pulposus) with myelopathy, cervical 05/23/2019    Class: Chronic   Spinal stenosis of cervical region 05/23/2019    Class: Chronic   Status post cervical spinal fusion 05/23/2019   Chronic respiratory failure with hypoxia (HCC) 04/12/2018   Bilateral lower extremity edema 12/29/2017   Hymenoptera allergy 11/10/2017   Family history of colonic polyps 08/07/2017   Diverticulosis 08/07/2017   Flatus 08/07/2017   Myofascial pain 08/06/2017   Seasonal and perennial allergic rhinitis 04/23/2017   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/2017   Cervicalgia 04/01/2017   Recurrent pneumonia 03/09/2017   Other spondylosis with radiculopathy, cervical region 02/09/2017   Depression with anxiety 01/04/2017   Memory difficulty 12/05/2016   Diarrhea in adult patient 12/03/2016   Internal hemorrhoids 12/03/2016   Chronic migraine without aura without status migrainosus, not intractable 10/30/2016   Lumbar radiculopathy 05/15/2016   Osteoarthritis of spine with radiculopathy, lumbar region 05/15/2016   Risk for falls 05/15/2016   Recurrent infections 03/29/2016   Chronic nonseasonal allergic rhinitis due to pollen 03/29/2016   Polypharmacy 01/16/2016   Morbid obesity with BMI of 45.0-49.9,  adult (HCC) 03/06/2015   SDAT 02/05/2015   OSA and COPD overlap syndrome (HCC) 02/05/2015   Medication management 08/02/2014   GERD (gastroesophageal reflux disease) 05/09/2014   Vitamin D  deficiency 08/01/2013   Positive TB test 07/29/2011   Diverticula of colon 05/07/2011   Hypertension 01/31/2011   Hyperlipidemia, mixed 01/31/2011   BPH (benign prostatic hyperplasia) 01/31/2011   Testosterone  Deficiency 01/31/2011   IBS (irritable bowel syndrome) 01/31/2011   Partial complex seizure disorder with intractable epilepsy (HCC) 01/31/2011   Depression, major, recurrent, in partial remission (HCC) 01/31/2011   Asthma-COPD overlap syndrome (HCC) 01/31/2011    PCP: Quintin Crane MD  REFERRING PROVIDER: Vernetta Lonni GRADE, MD   REFERRING DIAG:  [phone] (ICD-10-CM) - Chronic pain of left knee  M25.561,G89.29 (ICD-10-CM) - Chronic pain of right knee    THERAPY DIAG:  Bilateral chronic knee pain  Muscle weakness (generalized)  Other abnormalities of gait and mobility  Rationale for Evaluation and Treatment: Rehabilitation  ONSET DATE: >5 yrs  SUBJECTIVE:   SUBJECTIVE STATEMENT: Pt reports he is still recovering from his fall in the shower 2 wks ago.   Initial Subjective OA bilateral knees.  Not a surgical candidate at this time due to elevated BMI. Received steroid injections 01/18/24. Twisted my R knee the other day have basically stayed off of it. R knee hurting > left today usually the other way around. TBI 1985 with left sided le weakness; Had a fall in 2019 hurt my neck was paralyzed waist down resolved on its own, then had a cervical fusion 2020.  Pt reports he does not do a lot of walking as he feels unsafe.   PERTINENT HISTORY: 2020: cervical decompression discectomy/fusion Acute on chronic diastolic heart failure Closed head injury 1985 with residual left side weakness (left leg drags) PAIN:  Are you having pain? Yes: NPRS scale:7/10- knees, back, Lt  elbow Pain location: see above Pain description: achy; sharp Aggravating factors: walking; standing Relieving factors: sitting; elevation; ice  PRECAUTIONS: None  RED FLAGS: None   WEIGHT BEARING RESTRICTIONS: No  FALLS:  Has patient fallen in last 6 months? 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  DIAGNOSTIC FINDINGS:  L knee x-ray 2023: 2 views of the left knee show tricompartment arthritis mainly involving  the medial compartment of the knee.  This is moderate arthritis.   R knee x-ray 2023 2 views of the right knee show no significant malalignment.  There is mild  tricompartment arthritic changes.   PATIENT SURVEYS:  Lefs: 22/80 04/29/24:21/80  COGNITION: Overall cognitive status: Within functional limits for tasks assessed     SENSATION: WFL  EDEMA:  Minimal right knee  MUSCLE LENGTH: Hamstrings: tight bilaterally tested in sitting  POSTURE: rounded shoulders, forward head, and slight knee varus bilaterally  PALPATION: TTP medial joint line left knee  LOWER EXTREMITY ROM:  Active ROM Right eval Left eval Right 04/29/24  Hip flexion     Hip extension     Hip abduction     Hip adduction     Hip internal rotation     Hip external rotation     Knee flexion 105 130 130  Knee extension 0 0 0  Ankle dorsiflexion     Ankle plantarflexion     Ankle inversion     Ankle eversion      (Blank rows = not tested)  LOWER EXTREMITY MMT:  MMT Right eval Left eval Right / L 05/04/24  Hip  flexion 20.5 32.9 34.5  Hip extension     Hip abduction     Hip adduction     Hip internal rotation     Hip external rotation     Knee flexion 4/5 53.0   Knee extension 5/5 33.8 43.2 / 36.4  Ankle dorsiflexion 5 5   Ankle plantarflexion     Ankle inversion     Ankle eversion      (  Blank rows = not tested)  FUNCTIONAL TESTS:  Timed up and go (TUG): 28.67  4 stage balance: Passed 1&2.  Tandem and SLS unable 04/29/24: 18.58 sec with SPC  GAIT: Distance walked: 400 ft Assistive device utilized: Single point cane Level of assistance: Complete Independence Comments: Pt pauses upon standing then initiates gait. Knee flex                                                                                                                                 TREATMENT  [facility] Adult PT Treatment:                                                DATE: 06/14/24 Pt seen for aquatic therapy today.  Treatment took place in water 3.5-4.75 ft in depth at the [facility] pool. Temp of water was 91.  Pt entered/exited the pool via stairs using step to pattern with hand rail.  * walking forward/ backward  * side stepping with arm add/abd with yellow hand floats * 3 way LE kicks with UE On yellow hand floats, 2 x 5  * marching forward/ backward with reciprocal row * Bow & arrow * squats pushing yellow hand float under water x 15 * STS from bench in water to blue step x 10 * alternating toe taps to 1st/2nd step * forward step ups x 10 each LE *vertical decompression using white noodle: cycling  Pt requires the buoyancy and hydrostatic pressure of water for support, and to offload joints by unweighting joint load by at least 50 % in navel deep water and by at least 75-80% in chest to neck deep water.  Viscosity of the water is needed for resistance of strengthening. Water current perturbations provides challenge to standing balance requiring increased core activation.       PATIENT EDUCATION:   Education details: intro to aquatic therapy   Person educated: Patient Education method: Programmer, Multimedia, demo,  Education comprehension: verbalized understanding, return demo  HOME EXERCISE PROGRAM: tba  ASSESSMENT:  CLINICAL IMPRESSION: Pt tolerated all aquatic exercises except forward step ups which increased bil knee pain.  Advised pt to inform therapist if pt increasing during session. Pt is making gradual progress towards goals. Pt has met LTG 7.       PN: see note below. Testing demonstrates improved right hip strength meeting LTG.  He continues to have reduced pain sensitivity since injections reporting increased ease with functional mobility. He will continue to benefit from skilled PT aquatic intervention to progress towards previous and new stated goals. May consider adding land based intervention going forward. Last session: It has been 1 month since patient's last session due to scheduling difficulties.  He reports reduction in knee pain since recent injections.  Pt has reported some improvements in mobility  since starting therapy.  His TUG time improved by 10 sec. Pt reported good toleration to exercises performed in water today. Pt has met STG3 and LTG 4.  Therapist to assess remaining goals next visit for recert.    OBJECTIVE IMPAIRMENTS: Abnormal gait, decreased activity tolerance, decreased balance, decreased knowledge of use of DME, decreased mobility, difficulty walking, decreased ROM, decreased strength, obesity, and pain.   ACTIVITY LIMITATIONS: carrying, lifting, sitting, squatting, stairs, transfers, and locomotion level  PARTICIPATION LIMITATIONS: meal prep, cleaning, laundry, shopping, community activity, and yard work  PERSONAL FACTORS: Fitness and 3+ comorbidities: see PmHx are also affecting patient's functional outcome.   REHAB POTENTIAL: Good  CLINICAL DECISION MAKING: Stable/uncomplicated  EVALUATION COMPLEXITY: Low   GOALS: Goals reviewed with  patient? No  SHORT TERM GOALS: Target date: 03/26/24 Pt will tolerate full aquatic sessions consistently without increase in pain and with improving function to demonstrate good toleration and effectiveness of intervention.  Baseline: Goal status: Met 03/30/24  2.  Pt will tolerate stair climbing using alternating or combination of alternating and step to pattern ascending and descending 6 steps with use of handrail  Baseline:  Goal status: Met 03/30/24  3.  Pt will consider gaining pool access for use of the properties of water for chronic conditions maintaining mobility and minimizing pain. Baseline: has [employer]; hasn't been there yet Goal status: MET - 04/29/24    LONG TERM GOALS: Target date: 06/24/24  Pt to improve on LEFS by at least 9 point to demonstrate statistically significant Improvement in function. Baseline: see above - 04/29/24 Goal status: In progress   2. Pt will tolerate walking to and from setting and engaging in aquatic therapy session without excessive fatigue or increase in pain to demonstrate improved toleration to activity.  Baseline: improving; varies day to day Goal status:in progress - 04/29/24  3.  Pt will improve strength in RLE within 5 lbs of left to demonstrate improved overall physical function Baseline:  Goal status: Met 05/04/24  4.  Pt will improve ROM of  R knee flex to within 5d of left to improve function, decrease risk of injury and pain and improve quality of gait Baseline: See chart Goal status: MET- 04/29/24  5.  Pt will be indep with final HEP's (land and aquatic as appropriate) for continued management of condition Baseline:  Goal status: INITIAL   6. Pt will perform Tandem and SLS in 3.6 ft unsupported x 15s to demonstrate improved balance   Baseline: see above   Goals Status: NEW   7. Pt will perform 10 consecutive STS from water bench onto water step to demonstrate improved immediate standing balance and  strength.   Baseline:   Goal Status: MET- 06/14/24   PLAN:  PT FREQUENCY: 1-2x/week  PT DURATION: 8 week  PLANNED INTERVENTIONS: [id number]- PT Re-evaluation, 97750- Physical Performance Testing, 97110-Therapeutic exercises, 97530- Therapeutic activity, 97112- Neuromuscular re-education, 97535- Self Care, [id number]- Manual therapy, [phone]- Gait training, [phone]- Aquatic Therapy,  [phone] (1-2 muscles), [phone] (3+ muscles)- Dry Needling, Patient/Family education, Balance training, Stair training, Taping, Joint mobilization, DME instructions, Cryotherapy, and Moist heat  PLAN FOR NEXT SESSION: Aquatic: general strengthening with focus on knees and core; balance and proprioceptive retraining; stair climbing Land same as [employer].   Delon Aquas, PTA 06/14/24 2:56 PM [facility] [address] [place], [place], [address] Phone: [phone]   Fax:  [phone]  "

## 2024-06-15 ENCOUNTER — Encounter (HOSPITAL_COMMUNITY): Payer: Self-pay

## 2024-06-16 ENCOUNTER — Other Ambulatory Visit (HOSPITAL_BASED_OUTPATIENT_CLINIC_OR_DEPARTMENT_OTHER): Payer: Self-pay | Admitting: Family Medicine

## 2024-06-16 ENCOUNTER — Ambulatory Visit (HOSPITAL_COMMUNITY)

## 2024-06-16 DIAGNOSIS — F3341 Major depressive disorder, recurrent, in partial remission: Secondary | ICD-10-CM

## 2024-06-20 ENCOUNTER — Other Ambulatory Visit: Payer: Self-pay

## 2024-06-21 ENCOUNTER — Ambulatory Visit (HOSPITAL_BASED_OUTPATIENT_CLINIC_OR_DEPARTMENT_OTHER): Admitting: Physical Therapy

## 2024-06-21 ENCOUNTER — Encounter (HOSPITAL_BASED_OUTPATIENT_CLINIC_OR_DEPARTMENT_OTHER): Payer: Self-pay | Admitting: Physical Therapy

## 2024-06-21 DIAGNOSIS — M25561 Pain in right knee: Secondary | ICD-10-CM | POA: Diagnosis not present

## 2024-06-21 DIAGNOSIS — G8929 Other chronic pain: Secondary | ICD-10-CM

## 2024-06-21 DIAGNOSIS — R2689 Other abnormalities of gait and mobility: Secondary | ICD-10-CM

## 2024-06-21 DIAGNOSIS — M6281 Muscle weakness (generalized): Secondary | ICD-10-CM

## 2024-06-21 NOTE — Therapy (Signed)
 " OUTPATIENT PHYSICAL THERAPY LOWER EXTREMITY TREATMENT Re-Cert  Patient Name: Mark Harrell. MRN: [id number] DOB:1960-07-25, 64 y.o., male Today's Date: 06/21/2024  END OF SESSION:  PT End of Session - 06/21/24 1450     Visit Number 14    Date for Recertification  08/05/24    Authorization Type BCBS    Authorization Time Period visits approved  From 01.05.2026 - 03.05.2026    Authorization - Visit Number 4    Authorization - Number of Visits 9    Progress Note Due on Visit 17    PT Start Time 1442    PT Stop Time 1525    PT Time Calculation (min) 43 min    Activity Tolerance Patient tolerated treatment well    Behavior During Therapy WFL for tasks assessed/performed             Past Medical History:  Diagnosis Date   Allergy    Anesthesia complication requiring reversal agent administration    ? from central apnea, very difficult to get off vent   Anxiety    Arthritis    osteo   Asthma    BPH (benign prostatic hyperplasia)    Cataract    CHF (congestive heart failure) (HCC)    Complication of anesthesia    difficulty waking , they twlight me because of my respiratory problems    COPD (chronic obstructive pulmonary disease) (HCC)    COVID    moderate case 2023   Depression    Diabetes mellitus (HCC) 08/31/2023   DVT (deep venous thrombosis) (HCC) 2023   right leg   Dyspnea    on exertion   Enlarged heart    Family history of adverse reaction to anesthesia    mother trouble waking up, and heart stopped   GERD (gastroesophageal reflux disease)    Headache    botox  injections for headaches   Hyperlipidemia    Hypertension    Hypogonadism male    IBS (irritable bowel syndrome)    Kidney stones    Memory difficulties    short term memories   Neuromuscular disorder (HCC)    Neuropathy    Obesity    On home oxygen  therapy    on 2 liter   OSA (obstructive sleep apnea)    not using cpap   Oxygen  deficiency    Paralysis (HCC)    left hand - small     due to accident with an head injury   Pneumonia    Pre-diabetes    Prostatitis    Sleep apnea    Ulcer    Past Surgical History:  Procedure Laterality Date   ABDOMINAL SURGERY     ANKLE FRACTURE SURGERY Right    ANTERIOR CERVICAL DECOMP/DISCECTOMY FUSION N/A 05/23/2019   Procedure: ANTERIOR CERVICAL DISCECTOMY FUSION CERVICAL FIVE THROUGH CERVICAL SIX AND CERVICAL SIX THROUGH CERVICAL SEVEN;  Surgeon: Lucilla Lynwood BRAVO, MD;  Location: [facility] OR;  Service: Orthopedics;  Laterality: N/A;   BRONCHIAL BRUSHINGS  04/28/2022   Procedure: BRONCHIAL BRUSHINGS;  Surgeon: Shelah Lamar RAMAN, MD;  Location: [facility] ENDOSCOPY;  Service: Pulmonary;;   BRONCHIAL NEEDLE ASPIRATION BIOPSY  04/28/2022   Procedure: BRONCHIAL NEEDLE ASPIRATION BIOPSIES;  Surgeon: Shelah Lamar RAMAN, MD;  Location: [facility] ENDOSCOPY;  Service: Pulmonary;;   COLONOSCOPY     CYSTOSCOPY     Tannebaum   FRACTURE SURGERY     KNEE ARTHROSCOPY WITH MEDIAL MENISECTOMY Left 01/02/2017   Procedure: LEFT KNEE ARTHROSCOPY WITH PARTIAL MEDIAL MENISCECTOMY;  Surgeon: Vernetta Lonni GRADE, MD;  Location: [facility] ORS;  Service: Orthopedics;  Laterality: Left;   POSTERIOR CERVICAL FUSION/FORAMINOTOMY N/A 08/20/2020   Procedure: LEFT CERVICAL SIX THROUGH SEVEN  AND CERVICAL SEVEN THROUGH THORACIC ONE  FORAMINOTOMIES WITH EXCISION OF DISC HERNIATION LEFT CERVICAL SEVEN THROUGH THORACIC ONE;  Surgeon: Lucilla Lynwood BRAVO, MD;  Location: [facility] OR;  Service: Orthopedics;  Laterality: N/A;   SPINE SURGERY     TONSILLECTOMY     TURBINATE RESECTION  2007   UVULOPALATOPHARYNGOPLASTY     VIDEO BRONCHOSCOPY WITH RADIAL ENDOBRONCHIAL ULTRASOUND  04/28/2022   Procedure: VIDEO BRONCHOSCOPY WITH RADIAL ENDOBRONCHIAL ULTRASOUND;  Surgeon: Shelah Lamar RAMAN, MD;  Location: [facility] ENDOSCOPY;  Service: Pulmonary;;   Patient Active Problem List   Diagnosis Date Noted   Chest pain of uncertain etiology 06/01/2024   COPD exacerbation (HCC) 04/26/2024   Hymenoptera reaction 03/25/2024    COVID-19 02/22/2024   Acute on chronic diastolic heart failure (HCC) 09/30/2023   Diabetes mellitus (HCC) 08/31/2023   Rash 08/31/2023   Cognitive changes 08/31/2023   Unilateral primary osteoarthritis, left knee 02/24/2022   Unilateral primary osteoarthritis, right knee 02/24/2022   Pulmonary nodules 01/28/2022   History of adenomatous polyp of colon 01/08/2022   Hypokalemia 09/26/2021   Dyspnea 09/25/2021   Acute deep vein thrombosis (DVT) of right lower extremity (HCC) 09/24/2021   OSA (obstructive sleep apnea) 09/24/2021   Herniation of cervical intervertebral disc with radiculopathy    Status post cervical discectomy 08/20/2020   Tracheomalacia 05/26/2019   Acquired tracheomalacia 05/26/2019   HNP (herniated nucleus pulposus) with myelopathy, cervical 05/23/2019    Class: Chronic   Spinal stenosis of cervical region 05/23/2019    Class: Chronic   Status post cervical spinal fusion 05/23/2019   Chronic respiratory failure with hypoxia (HCC) 04/12/2018   Bilateral lower extremity edema 12/29/2017   Hymenoptera allergy 11/10/2017   Family history of colonic polyps 08/07/2017   Diverticulosis 08/07/2017   Flatus 08/07/2017   Myofascial pain 08/06/2017   Seasonal and perennial allergic rhinitis 04/23/2017   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/2017   Cervicalgia 04/01/2017   Recurrent pneumonia 03/09/2017   Other spondylosis with radiculopathy, cervical region 02/09/2017   Depression with anxiety 01/04/2017   Memory difficulty 12/05/2016   Diarrhea in adult patient 12/03/2016   Internal hemorrhoids 12/03/2016   Chronic migraine without aura without status migrainosus, not intractable 10/30/2016   Lumbar radiculopathy 05/15/2016   Osteoarthritis of spine with radiculopathy, lumbar region 05/15/2016   Risk for falls 05/15/2016   Recurrent infections 03/29/2016   Chronic nonseasonal allergic rhinitis due to pollen 03/29/2016   Polypharmacy 01/16/2016   Morbid obesity with BMI of  45.0-49.9, adult (HCC) 03/06/2015   SDAT 02/05/2015   OSA and COPD overlap syndrome (HCC) 02/05/2015   Medication management 08/02/2014   GERD (gastroesophageal reflux disease) 05/09/2014   Vitamin D  deficiency 08/01/2013   Positive TB test 07/29/2011   Diverticula of colon 05/07/2011   Hypertension 01/31/2011   Hyperlipidemia, mixed 01/31/2011   BPH (benign prostatic hyperplasia) 01/31/2011   Testosterone  Deficiency 01/31/2011   IBS (irritable bowel syndrome) 01/31/2011   Partial complex seizure disorder with intractable epilepsy (HCC) 01/31/2011   Depression, major, recurrent, in partial remission (HCC) 01/31/2011   Asthma-COPD overlap syndrome (HCC) 01/31/2011    PCP: Quintin Crane MD  REFERRING PROVIDER: Vernetta Lonni GRADE, MD   REFERRING DIAG:  [phone] (ICD-10-CM) - Chronic pain of left knee  M25.561,G89.29 (ICD-10-CM) - Chronic pain of right knee    THERAPY DIAG:  Bilateral chronic knee pain  Muscle weakness (generalized)  Other abnormalities of gait and mobility  Rationale for Evaluation and Treatment: Rehabilitation  ONSET DATE: >5 yrs  SUBJECTIVE:   SUBJECTIVE STATEMENT: Pt  reports knee pain continues to be high 7/10  Initial Subjective OA bilateral knees.  Not a surgical candidate at this time due to elevated BMI. Received steroid injections 01/18/24. Twisted my R knee the other day have basically stayed off of it. R knee hurting > left today usually the other way around. TBI 1985 with left sided le weakness; Had a fall in 2019 hurt my neck was paralyzed waist down resolved on its own, then had a cervical fusion 2020.  Pt reports he does not do a lot of walking as he feels unsafe.   PERTINENT HISTORY: 2020: cervical decompression discectomy/fusion Acute on chronic diastolic heart failure Closed head injury 1985 with residual left side weakness (left leg drags) PAIN:  Are you having pain? Yes: NPRS scale:7/10- knees, back, Lt elbow Pain  location: see above Pain description: achy; sharp Aggravating factors: walking; standing Relieving factors: sitting; elevation; ice  PRECAUTIONS: None  RED FLAGS: None   WEIGHT BEARING RESTRICTIONS: No  FALLS:  Has patient fallen in last 6 months? 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  DIAGNOSTIC FINDINGS:  L knee x-ray 2023: 2 views of the left knee show tricompartment arthritis mainly involving  the medial compartment of the knee.  This is moderate arthritis.   R knee x-ray 2023 2 views of the right knee show no significant malalignment.  There is mild  tricompartment arthritic changes.   PATIENT SURVEYS:  Lefs: 22/80 04/29/24:21/80  COGNITION: Overall cognitive status: Within functional limits for tasks assessed     SENSATION: WFL  EDEMA:  Minimal right knee  MUSCLE LENGTH: Hamstrings: tight bilaterally tested in sitting  POSTURE: rounded shoulders, forward head, and slight knee varus bilaterally  PALPATION: TTP medial joint line left knee  LOWER EXTREMITY ROM:  Active ROM Right eval Left eval Right 04/29/24  Hip flexion     Hip extension     Hip abduction     Hip adduction     Hip internal rotation     Hip external rotation     Knee flexion 105 130 130  Knee extension 0 0 0  Ankle dorsiflexion     Ankle plantarflexion     Ankle inversion     Ankle eversion      (Blank rows = not tested)  LOWER EXTREMITY MMT:  MMT Right eval Left eval Right / L 05/04/24 R / L 06/21/24   Hip flexion 20.5 32.9 34.5   Hip extension      Hip abduction      Hip adduction      Hip internal rotation      Hip external rotation      Knee flexion 4/5 53.0    Knee extension 5/5 33.8 43.2 / 36.4 43.0 / 42.3  Ankle dorsiflexion 5 5    Ankle plantarflexion      Ankle  inversion      Ankle eversion       (Blank rows = not tested)  FUNCTIONAL TESTS:  Timed up and go (TUG): 28.67  4 stage balance: Passed 1&2.  Tandem and SLS unable 04/29/24: 18.58 sec with SPC  GAIT: Distance walked: 400 ft Assistive device utilized: Single point cane Level of assistance: Complete Independence Comments: Pt pauses upon standing then initiates gait. Knee flex                                                                                                                                 TREATMENT  [facility] Adult PT Treatment:                                                DATE: 06/21/24 Pt seen for aquatic therapy today.  Treatment took place in water 3.5-4.75 ft in depth at the [facility] pool. Temp of water was 91.  Pt entered/exited the pool via stairs using step to pattern with hand rail.  * walking forward/ backward  * side stepping with arm add/abd with yellow hand floats * marching forward/ backward with kick * 3 way LE kicks with UE On yellow hand floats, 2 x 5  * Bow & arrow * alternating toe taps to 2nd step 2 x 10 *tandem stance 3.6 ft leading R/L x 17s *SLS Left unsupported x 16s   HD strength testing  Pt requires the buoyancy and hydrostatic pressure of water for support, and to offload joints by unweighting joint load by at least 50 % in navel deep water and by at least 75-80% in chest to neck deep water.  Viscosity of the water is needed for resistance of strengthening. Water current perturbations provides challenge to standing balance requiring increased core activation.       PATIENT EDUCATION:  Education details: intro to aquatic therapy   Person educated:  Patient Education method: Explanation, demo,  Education comprehension: verbalized understanding, return demo  HOME EXERCISE PROGRAM: Access Code: [email or web address] URL: [email or web address] Date: 06/21/2024 Prepared by: Matilda Kohut  Exercises - Single Leg Stance /Arm up and down with hand buoy  - 1 x daily - 7 x weekly - 3 sets - 10 reps - Side lunge with hand buoys  - 1 x daily - 7 x weekly - 3 sets - 10 reps - Bow and Arrow   - 1 x daily - 7 x weekly - 3 sets - 10 reps - Standing 3-Way Leg Reach  - 1 x daily - 7 x weekly - 3 sets - 10 reps - Squat  - 1 x daily - 7 x weekly - 3 sets - 10 reps - forward march with knee kick  - 1 x daily - 7 x weekly - 3 sets - 10  reps - Standing Toe Taps  - 1 x daily - 7 x weekly - 3 sets - 10 reps  ASSESSMENT:  CLINICAL IMPRESSION: Pt arrives for final aquatic session. Aquatic HEP assigned but not issued as pt not certain of potential pool access going forward.  He is directed through progressive strengthening exercises as able in setting.  He has reached his max potential in setting and is ready for transition onto land for added load.  I am uncertain if pt will tolerate as his pain sensitivity has increased since injections a few weeks ago.  He is encouraged to give best effort. He vu exercising in aquatic setting will most likely be the most comfortable setting due to progressive nature of Knee oa.  He will benefit from a land based HEP in event he does not gain pool access or is unable to get to pool for continued management of chronic condition. Most goals met.       PN: see note below. Testing demonstrates improved right hip strength meeting LTG.  He continues to have reduced pain sensitivity in knees since injections reporting increased ease with functional mobility. He will continue to benefit from skilled PT aquatic intervention to progress towards previous and new stated goals. May consider adding land based intervention going  forward. Last session: It has been 1 month since patient's last session due to scheduling difficulties.  He reports reduction in knee pain since recent injections.  Pt has reported some improvements in mobility since starting therapy.  His TUG time improved by 10 sec. Pt reported good toleration to exercises performed in water today. Pt has met STG3 and LTG 4.  Therapist to assess remaining goals next visit for recert.    OBJECTIVE IMPAIRMENTS: Abnormal gait, decreased activity tolerance, decreased balance, decreased knowledge of use of DME, decreased mobility, difficulty walking, decreased ROM, decreased strength, obesity, and pain.   ACTIVITY LIMITATIONS: carrying, lifting, sitting, squatting, stairs, transfers, and locomotion level  PARTICIPATION LIMITATIONS: meal prep, cleaning, laundry, shopping, community activity, and yard work  PERSONAL FACTORS: Fitness and 3+ comorbidities: see PmHx are also affecting patient's functional outcome.   REHAB POTENTIAL: Good  CLINICAL DECISION MAKING: Stable/uncomplicated  EVALUATION COMPLEXITY: Low   GOALS: Goals reviewed with patient? No  SHORT TERM GOALS: Target date: 03/26/24 Pt will tolerate full aquatic sessions consistently without increase in pain and with improving function to demonstrate good toleration and effectiveness of intervention.  Baseline: Goal status: Met 03/30/24  2.  Pt will tolerate stair climbing using alternating or combination of alternating and step to pattern ascending and descending 6 steps with use of handrail  Baseline:  Goal status: Met 03/30/24  3.  Pt will consider gaining pool access for use of the properties of water for chronic conditions maintaining mobility and minimizing pain. Baseline: has [employer]; hasn't been there yet Goal status: MET - 04/29/24    LONG TERM GOALS: Target date: 08/05/24  Pt to improve on LEFS by at least 9 point to demonstrate statistically significant Improvement in  function. Baseline: see above - 04/29/24 Goal status: In progress   2. Pt will tolerate walking to and from setting and engaging in aquatic therapy session without excessive fatigue or increase in pain to demonstrate improved toleration to activity.  Baseline: improving; varies day to day Goal status:in progress - 04/29/24; met 06/21/24  3.  Pt will improve strength in RLE within 5 lbs of left to demonstrate improved overall physical function Baseline:  Goal status: Met 05/04/24  4.  Pt will improve ROM of  R knee flex to within 5d of left to improve function, decrease risk of injury and pain and improve quality of gait Baseline: See chart Goal status: MET- 04/29/24  5.  Pt will be indep with final HEP's (land and aquatic as appropriate) for continued management of condition Baseline: aquatics deferred as of 1/27 as pt does not have pool access Goal status: In progress 06/21/24   6. Pt will perform Tandem and SLS in 3.6 ft unsupported x 15s to demonstrate improved balance   Baseline: see above   Goals Status: Met 22/27/26   7. Pt will perform 10 consecutive STS from water bench onto water step to demonstrate improved immediate standing balance and strength.   Baseline:   Goal Status: MET- 06/14/24    8. Pt will report improved toleration to amb community distances with AD (grocery shopping)   Baseline   Goal Status: NEW PLAN:  PT FREQUENCY: 1-2x/week  PT DURATION: 6 weeks  PLANNED INTERVENTIONS: 97164- PT Re-evaluation, 97750- Physical Performance Testing, 97110-Therapeutic exercises, 97530- Therapeutic activity, 97112- Neuromuscular re-education, 97535- Self Care, 02859- Manual therapy, [id number]- Gait training, [phone]- Aquatic Therapy,  [phone] (1-2 muscles), 20561 (3+ muscles)- Dry Needling, Patient/Family education, Balance training, Stair training, Taping, Joint mobilization, DME instructions, Cryotherapy, and Moist heat  PLAN FOR NEXT SESSION: Aquatic: general strengthening with focus  on knees and core; balance and proprioceptive retraining; stair climbing Land same as [employer].   Ronal Garnavillo) Barron Vanloan MPT 06/21/24 3:05 PM [facility] [address] [place], [place], [address] Phone: [phone]   Fax:  [phone]   "

## 2024-06-22 ENCOUNTER — Ambulatory Visit: Admitting: Emergency Medicine

## 2024-06-22 ENCOUNTER — Encounter: Payer: Self-pay | Admitting: Emergency Medicine

## 2024-06-22 VITALS — BP 136/81 | HR 67 | Temp 99.3°F | Ht 72.0 in | Wt 334.4 lb

## 2024-06-22 DIAGNOSIS — J3089 Other allergic rhinitis: Secondary | ICD-10-CM

## 2024-06-22 DIAGNOSIS — Z720 Tobacco use: Secondary | ICD-10-CM | POA: Diagnosis not present

## 2024-06-22 DIAGNOSIS — J4489 Other specified chronic obstructive pulmonary disease: Secondary | ICD-10-CM | POA: Diagnosis not present

## 2024-06-22 DIAGNOSIS — J9611 Chronic respiratory failure with hypoxia: Secondary | ICD-10-CM

## 2024-06-22 DIAGNOSIS — G4733 Obstructive sleep apnea (adult) (pediatric): Secondary | ICD-10-CM | POA: Diagnosis not present

## 2024-06-22 NOTE — Progress Notes (Signed)
 "  Subjective:    Patient ID: Mark LITTIE [name]., male    DOB: Sep 23, 1960, 64 y.o.   MRN: [id number]  HPI  ROV 04/14/2024 --Shamal is 64 with a minimal tobacco history, asthmatic COPD, OSA not on CPAP, DVT.  He also has pulmonary nodular disease that we have followed on serial imaging post bronchoscopy 04/28/2022 that was cytology negative.  He has had a decrease in size of his nodules since then.  He managed on Trelegy, Dupixent , albuterol  if needed.  He is on oxygen  at 2-3 L/min. He had COVID again in 01/2024, has improved, close to baseline but may not be fully recovered. Some cough, sputum production  Hoem sleep test 11/23/23 >> severe OSA, 64/hr.   CT scan of the chest performed 04/13/2024 and reviewed by me shows stable appearance of his multiple pulmonary nodules predominantly perilymphatic distribution in the bilateral lower lobes the predominant posterior medial right lower lobe nodule 3.4 x 1.8 cm is stable in size and appearance.  No new nodules  ROV 06/22/2024 --64 year old gentleman with a history of minimal tobacco use, asthmatic COPD, VTE, pulmonary nodular disease which we have followed with serial imaging.  He had a negative bronchoscopy 12//23.  His nodules have decreased in size since then.  He was diagnosed with severe OSA by home sleep study 10/2023.  We ordered CPAP for him in November 2025.  He has been managed on Trelegy and was changed to Breztri  since last visit., Dupixent  and albuterol  as needed.  He uses oxygen  at 2-3 L/min. He reports today that he never got the CPAP from [facility] - they had a hard time reaching him and then the order was cancelled. He is willing to re-order. He had a severe URI in December - he was treated with pred and abx and was slow to recover. He has experienced some exertional SOB and CP - is planning to have further cards eval w Dr Court. He uses his O2 when he is at home. Does not use regularly out of the home.    Review of Systems As per HPI  Past  Medical History:  Diagnosis Date   Allergy    Anesthesia complication requiring reversal agent administration    ? from central apnea, very difficult to get off vent   Anxiety    Arthritis    osteo   Asthma    BPH (benign prostatic hyperplasia)    Cataract    CHF (congestive heart failure) (HCC)    Complication of anesthesia    difficulty waking , they twlight me because of my respiratory problems    COPD (chronic obstructive pulmonary disease) (HCC)    COVID    moderate case 2023   Depression    Diabetes mellitus (HCC) 08/31/2023   DVT (deep venous thrombosis) (HCC) 2023   right leg   Dyspnea    on exertion   Enlarged heart    Family history of adverse reaction to anesthesia    mother trouble waking up, and heart stopped   GERD (gastroesophageal reflux disease)    Headache    botox  injections for headaches   Hyperlipidemia    Hypertension    Hypogonadism male    IBS (irritable bowel syndrome)    Kidney stones    Memory difficulties    short term memories   Neuromuscular disorder (HCC)    Neuropathy    Obesity    On home oxygen  therapy    on 2 liter   OSA (  obstructive sleep apnea)    not using cpap   Oxygen  deficiency    Paralysis (HCC)    left hand - small    due to accident with an head injury   Pneumonia    Pre-diabetes    Prostatitis    Sleep apnea    Ulcer            Objective:   Physical Exam  Vitals:   06/22/24 1606  BP: 136/81  Pulse: 67  Temp: 99.3 F (37.4 C)  TempSrc: Oral  SpO2: 95%  Weight: (!) 334 lb 6.4 oz (151.7 kg)  Height: 6' (1.829 m)    Gen: Pleasant, obese, in no distress,  normal affect  ENT: No lesions,  mouth clear,  oropharynx clear, no postnasal drip  Neck: No JVD, no stridor  Lungs: No use of accessory muscles, no crackles or wheezing on normal respiration, no wheeze on forced expiration  Cardiovascular: RRR, heart sounds normal, no murmur or gallops, trace peripheral edema  Musculoskeletal: No deformities,  no cyanosis or clubbing  Neuro: alert, awake, non focal  Skin: Warm, no lesions or rash      Assessment & Plan:   OSA (obstructive sleep apnea) He never got his CPAP.  Apparently [facility] was unable to contact him after the order was made.  There was an issue with his phone and whether he was reachable.  This has been corrected.  We will reorder the CPAP now.  Needs to follow-up in about 2-3 months so we can assess tolerance, compliance, benefit.  Asthma-COPD overlap syndrome [facility]) He had a fairly significant exacerbation in early December that responded to antibiotics and prednisone .  Since last time he is been changed from Trelegy to Breztri , feels that it helps him about the same.  He follows with Dr. Iva and is on Dupixent , has benefited from this  Chronic nonseasonal allergic rhinitis due to pollen On Dupixent   Chronic respiratory failure with hypoxia [employer]) He wears his oxygen  reliably at home but the tanks are too heavy and cumbersome when he is outside the home.  We qualified him in the past for POC but apparently his insurance would not pay for it because he did not use the tanks reliably or enough?  We will likely need to try to requalify him and reorder because the absence of a POC is impacting his compliance.    I personally spent a total of 30 minutes in the care of the patient today including preparing to see the patient, getting/reviewing separately obtained history, performing a medically appropriate exam/evaluation, counseling and educating, placing orders, documenting clinical information in the EHR, independently interpreting results, and communicating results.   Lamar Chris, MD, PhD 06/22/2024, 4:32 PM [facility] [phone] or if no answer before 7:00PM call [phone] For any issues after 7:00PM please call eLink [phone]  "

## 2024-06-22 NOTE — Assessment & Plan Note (Signed)
 He never got his CPAP.  Apparently [facility] was unable to contact him after the order was made.  There was an issue with his phone and whether he was reachable.  This has been corrected.  We will reorder the CPAP now.  Needs to follow-up in about 2-3 months so we can assess tolerance, compliance, benefit.

## 2024-06-22 NOTE — Patient Instructions (Addendum)
 We will work on reordering your CPAP.  Try to wear it reliably once you receive it.  We will have you follow-up to confirm that you received and to assess compliance, benefit. Please continue Breztri  2 puffs twice a day.  Rinse and gargle after use. Keep your albuterol  available to use 2 puffs if needed for shortness of breath, chest tightness, wheezing. Continue your Dupixent  as you have been taking it and follow with Dr. Iva as planned. Continue oxygen  at 2-3 L/min with exertion.  We will need to consider rechecking and reordering a portable oxygen  concentrator at some point going forward. Agree with follow-up and testing with Dr. Court. Follow-up in our office in 2-3 months so we can discuss your CPAP use.

## 2024-06-22 NOTE — Assessment & Plan Note (Signed)
 On Dupixent 

## 2024-06-22 NOTE — Assessment & Plan Note (Signed)
 He wears his oxygen  reliably at home but the tanks are too heavy and cumbersome when he is outside the home.  We qualified him in the past for POC but apparently his insurance would not pay for it because he did not use the tanks reliably or enough?  We will likely need to try to requalify him and reorder because the absence of a POC is impacting his compliance.

## 2024-06-22 NOTE — Assessment & Plan Note (Signed)
 He had a fairly significant exacerbation in early December that responded to antibiotics and prednisone .  Since last time he is been changed from Trelegy to Breztri , feels that it helps him about the same.  He follows with Dr. Iva and is on Dupixent , has benefited from this

## 2024-06-23 ENCOUNTER — Ambulatory Visit (HOSPITAL_COMMUNITY)
Admission: RE | Admit: 2024-06-23 | Discharge: 2024-06-23 | Disposition: A | Discharge status: Home / Self Care | Source: Ambulatory Visit | Attending: Cardiology | Admitting: Cardiology

## 2024-06-23 ENCOUNTER — Encounter: Payer: Self-pay | Admitting: Orthopaedic Surgery

## 2024-06-23 ENCOUNTER — Encounter: Payer: Self-pay | Admitting: Cardiovascular Disease

## 2024-06-23 ENCOUNTER — Other Ambulatory Visit: Payer: Self-pay | Admitting: Cardiovascular Disease

## 2024-06-23 DIAGNOSIS — R072 Precordial pain: Secondary | ICD-10-CM | POA: Insufficient documentation

## 2024-06-23 DIAGNOSIS — I10 Essential (primary) hypertension: Secondary | ICD-10-CM

## 2024-06-23 MED ORDER — NITROGLYCERIN 0.4 MG SL SUBL
0.8000 mg | SUBLINGUAL_TABLET | Freq: Once | SUBLINGUAL | Status: AC
  Administered 2024-06-23: 0.8 mg via SUBLINGUAL

## 2024-06-23 MED ORDER — IOHEXOL 350 MG/ML SOLN
100.0000 mL | Freq: Once | INTRAVENOUS | Status: AC | PRN
  Administered 2024-06-23: 100 mL via INTRAVENOUS

## 2024-06-24 ENCOUNTER — Other Ambulatory Visit: Payer: Self-pay

## 2024-06-24 DIAGNOSIS — M170 Bilateral primary osteoarthritis of knee: Secondary | ICD-10-CM

## 2024-06-28 ENCOUNTER — Encounter: Payer: Self-pay | Admitting: Orthopaedic Surgery

## 2024-06-28 ENCOUNTER — Encounter (HOSPITAL_BASED_OUTPATIENT_CLINIC_OR_DEPARTMENT_OTHER): Payer: Self-pay | Admitting: Physical Therapy

## 2024-06-28 ENCOUNTER — Other Ambulatory Visit: Payer: Self-pay | Admitting: Allergy & Immunology

## 2024-06-28 ENCOUNTER — Encounter: Payer: Self-pay | Admitting: Allergy & Immunology

## 2024-06-28 ENCOUNTER — Other Ambulatory Visit (HOSPITAL_COMMUNITY): Payer: Self-pay

## 2024-06-28 ENCOUNTER — Ambulatory Visit: Admitting: Allergy & Immunology

## 2024-06-28 ENCOUNTER — Ambulatory Visit: Admitting: Adult Health

## 2024-06-28 ENCOUNTER — Other Ambulatory Visit: Payer: Self-pay

## 2024-06-28 ENCOUNTER — Ambulatory Visit (HOSPITAL_BASED_OUTPATIENT_CLINIC_OR_DEPARTMENT_OTHER): Attending: Orthopaedic Surgery | Admitting: Physical Therapy

## 2024-06-28 VITALS — BP 130/86 | HR 91 | Temp 98.6°F | Ht 72.0 in | Wt 342.0 lb

## 2024-06-28 DIAGNOSIS — L2084 Intrinsic (allergic) eczema: Secondary | ICD-10-CM

## 2024-06-28 DIAGNOSIS — J4489 Other specified chronic obstructive pulmonary disease: Secondary | ICD-10-CM

## 2024-06-28 DIAGNOSIS — B999 Unspecified infectious disease: Secondary | ICD-10-CM

## 2024-06-28 DIAGNOSIS — M6281 Muscle weakness (generalized): Secondary | ICD-10-CM

## 2024-06-28 DIAGNOSIS — R2689 Other abnormalities of gait and mobility: Secondary | ICD-10-CM

## 2024-06-28 DIAGNOSIS — K219 Gastro-esophageal reflux disease without esophagitis: Secondary | ICD-10-CM | POA: Diagnosis not present

## 2024-06-28 DIAGNOSIS — J302 Other seasonal allergic rhinitis: Secondary | ICD-10-CM | POA: Diagnosis not present

## 2024-06-28 DIAGNOSIS — G8929 Other chronic pain: Secondary | ICD-10-CM

## 2024-06-28 DIAGNOSIS — J3089 Other allergic rhinitis: Secondary | ICD-10-CM | POA: Diagnosis not present

## 2024-06-28 MED ORDER — PREDNISONE 10 MG PO TABS: ORAL_TABLET | ORAL | 0 refills | Status: AC

## 2024-06-28 NOTE — Progress Notes (Unsigned)
 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 - seems to be stable at this point without prophylactic antibiotics    Asthma-COPD overlap syndrome with worsening pulmonary status since contracting COVID pneumonia in January 2023   Previously doing well on Nucala  and then Dupixent  (stopped January 2023 after his COVID PNA, but now back on Dupixent ) - latest AEC 227 in December 2024   Bilateral pulmonary nodules - benign biopsies in December 2023   Hymenoptera allergy - EpiPen  up to date   GERD - on PRN H2 blocker    Obesity - with resulting nerve impingement and joint pain   Chronic back pain - s/p recent neck surgery   Migraines - on botulinum injections   Polypharmacy   Normal echocardiogram (May 2023)   Plan/Recommendations:   1. 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 - Ok to stay off of doxycycline .   2. Asthma-COPD overlap syndrome - Lung testing looks a little worse compared to the last time but it did improve with the albuterol  treatment.  - We will try to change to Nucala  since you seemed to think that this worked better. - Start prednisone  taper.  - Daily controller medication(s): Breztri  two puffs twice daily with spacer and Singulair  10mg  dail  - Prior to physical activity: albuterol  2 puffs 10-15 minutes before physical activity - Rescue medications: albuterol  1-2 puffs every 4-6 hours as needed or DuoNeb nebulizer one vial every 4-6 hours as needed - Asthma control goals:  * Full participation in all desired activities (may need albuterol  before activity) * Albuterol  use two time or less a week on average (not counting use with activity) * Cough interfering with sleep two  time or less a month * Oral steroids no more than once a year * No hospitalizations   3. Allergic rhinitis (sweet vernal grass, box elder, cat, weeds, ragweed, molds, cockroach, dust mite) - Continue with saline mist 1-2 times daily.  - Continue Patanase  two sprays per nostril twice daily. - Continue with Ipratropium one spray per nostril every 8 hours as needed for runny noses.  - Continue with Cetirizine  10mg  daily as needed for runny nose, sneezing, itchy watery eyes.  - Continue with Singulair  (montelukast ) 10mg  in the evening.   4. GERD - Continue with Protonix  40 mg daily to see if this helps with any improvement in symptoms at that time.   5. Return in about 3 months (around 09/25/2024). You can have the follow up appointment with Dr. Iva or a Nurse Practicioner (our Nurse Practitioners are excellent and always have Physician oversight!).   Subjective:   Mark Harrell. is a 64 y.o. male presenting today for follow up of  Chief Complaint  Patient presents with   Asthma    It's ok, still having SOB    Mark Harrell Mark Harrell. has a history of the following: Patient Active Problem List   Diagnosis Date Noted   Chest pain of uncertain etiology 06/01/2024   COPD exacerbation (HCC) 04/26/2024   Hymenoptera reaction 03/25/2024   COVID-19 02/22/2024   Acute on chronic diastolic heart failure (HCC) 09/30/2023   Diabetes mellitus (HCC) 08/31/2023   Rash 08/31/2023   Cognitive changes  08/31/2023   Unilateral primary osteoarthritis, left knee 02/24/2022   Unilateral primary osteoarthritis, right knee 02/24/2022   Pulmonary nodules 01/28/2022   History of adenomatous polyp of colon 01/08/2022   Hypokalemia 09/26/2021   Dyspnea 09/25/2021   Acute deep vein thrombosis (DVT) of right lower extremity (HCC) 09/24/2021   OSA (obstructive sleep apnea) 09/24/2021   Herniation of cervical intervertebral disc with radiculopathy    Status post cervical discectomy 08/20/2020    Tracheomalacia 05/26/2019   Acquired tracheomalacia 05/26/2019   HNP (herniated nucleus pulposus) with myelopathy, cervical 05/23/2019    Class: Chronic   Spinal stenosis of cervical region 05/23/2019    Class: Chronic   Status post cervical spinal fusion 05/23/2019   Chronic respiratory failure with hypoxia (HCC) 04/12/2018   Bilateral lower extremity edema 12/29/2017   Hymenoptera allergy 11/10/2017   Family history of colonic polyps 08/07/2017   Diverticulosis 08/07/2017   Flatus 08/07/2017   Myofascial pain 08/06/2017   Seasonal and perennial allergic rhinitis 04/23/2017   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/2017   Cervicalgia 04/01/2017   Recurrent pneumonia 03/09/2017   Other spondylosis with radiculopathy, cervical region 02/09/2017   Depression with anxiety 01/04/2017   Memory difficulty 12/05/2016   Diarrhea in adult patient 12/03/2016   Internal hemorrhoids 12/03/2016   Chronic migraine without aura without status migrainosus, not intractable 10/30/2016   Lumbar radiculopathy 05/15/2016   Osteoarthritis of spine with radiculopathy, lumbar region 05/15/2016   Risk for falls 05/15/2016   Recurrent infections 03/29/2016   Chronic nonseasonal allergic rhinitis due to pollen 03/29/2016   Polypharmacy 01/16/2016   Morbid obesity with BMI of 45.0-49.9, adult (HCC) 03/06/2015   SDAT 02/05/2015   OSA and COPD overlap syndrome (HCC) 02/05/2015   Medication management 08/02/2014   GERD (gastroesophageal reflux disease) 05/09/2014   Vitamin D  deficiency 08/01/2013   Positive TB test 07/29/2011   Diverticula of colon 05/07/2011   Hypertension 01/31/2011   Hyperlipidemia, mixed 01/31/2011   BPH (benign prostatic hyperplasia) 01/31/2011   Testosterone  Deficiency 01/31/2011   IBS (irritable bowel syndrome) 01/31/2011   Partial complex seizure disorder with intractable epilepsy (HCC) 01/31/2011   Depression, major, recurrent, in partial remission (HCC) 01/31/2011   Asthma-COPD overlap  syndrome (HCC) 01/31/2011    History obtained from: chart review and patient.  Discussed the use of AI scribe software for clinical note transcription with the patient and/or guardian, who gave verbal consent to proceed.  Mark Harrell is a 64 y.o. male presenting for a follow up visit.  He was last seen in November 2025.  At that time, his infections were under good control without any antibiotics.  For his asthma COPD overlap, the Dupixent  had been helpful in controlling his breathing.  He remains on Breztri  2 puffs twice daily as well as Singulair .  For his allergic rhinitis, he continued on Patanase  as well as ipratropium, Zyrtec , and montelukast .  GERD was not well-controlled.  We added on Protonix  40 mg daily.  Since the last visit, he has done relatively well.   Asthma/Respiratory Symptom History: Since last visit, he has continued to have some SOB. He has been experiencing ongoing shortness of breath, which worsened significantly after his last visit in November, necessitating antibiotics and prednisone . During a vacation in [place], he opted for a virtual consultation instead of visiting a hospital or urgent care, receiving medication through a virtual service.  He is currently using Breztri , two puffs twice a day with a spacer, and self-administers Dupixent  at home. He is uncertain about  Dupixent 's effectiveness for his asthma but notes improvement in his skin condition. He has previously been on Nucala  and expresses a preference for it.  He has undergone a heart workup, including an echocardiogram in May and a functional CT with contrast on January 29th, which showed stable nodules in the lower lobes and a coronary calcium  score of 6.36. A previous blood test indicated high calcium  levels, leading him to reduce his calcium  intake.  He has experienced difficulties with CPAP in the past due to night terrors. He knows that he needs to give it another try and he has requested a new mask. He is  hoping that he will tolerate it better with the mask.   Last chest CT Was in November 2025. It is now stable. Results shown below.   Stable appearance of bilateral cluster of pulmonary nodules measuring up to 3.4 x 1.8 cm, likely sequela from chronic infection/inflammation including sarcoidosis. Correlate with clinical findings. Follow-up to ensure stability.    Allergic Rhinitis Symptom History: He remains on the Patanase  as well as the Atrovent . He also remains on the cetirizine  and the montelukast  daily. He has not been re-tested in several years.   Skin Symptom History: Skin is under good control with the current regimen. Dupxient has been very helpful with his skin control.    GERD Symptom History: He remains on the pantoprazole  as well as the Pepcid . He was not aware that he could take both at the same time, but he is going to give that a try.   Infection Symptom History: He had one infection shortly after I saw him in November. He did not have to go to the hospital at all. He has not been on doxycycline  for several months. We have never had him on immunoglobulin therapy.   Otherwise, there have been no changes to his past medical history, surgical history, family history, or social history.    Review of systems otherwise negative other than that mentioned in the HPI.    Objective:   Blood pressure 130/86, pulse 91, temperature 98.6 F (37 C), temperature source Temporal, height 6' (1.829 m), weight (!) 342 lb (155.1 kg), SpO2 99%. Body mass index is 46.38 kg/m.    Physical Exam Vitals reviewed.  Constitutional:      Appearance: He is well-developed. He is morbidly obese. He is not ill-appearing or toxic-appearing.     Comments: He seems more energetic today. Making full sentences without a problem.   HENT:     Head: Normocephalic and atraumatic.     Right Ear: Tympanic membrane, ear canal and external ear normal.     Left Ear: Tympanic membrane, ear canal and external  ear normal.     Nose: No nasal deformity, septal deviation, mucosal edema or rhinorrhea.     Right Turbinates: Enlarged, swollen and pale.     Left Turbinates: Enlarged, swollen and pale.     Right Sinus: No maxillary sinus tenderness or frontal sinus tenderness.     Left Sinus: No maxillary sinus tenderness or frontal sinus tenderness.     Comments: No nasal polyps.     Mouth/Throat:     Lips: Pink.     Mouth: Mucous membranes are moist. Mucous membranes are not pale and not dry.     Pharynx: Uvula midline.  Eyes:     General: Lids are normal. Allergic shiner present.        Right eye: No discharge.        Left  eye: No discharge.     Conjunctiva/sclera: Conjunctivae normal.     Right eye: Right conjunctiva is not injected. No chemosis.    Left eye: Left conjunctiva is not injected. No chemosis.    Pupils: Pupils are equal, round, and reactive to light.  Cardiovascular:     Rate and Rhythm: Normal rate and regular rhythm.     Heart sounds: Normal heart sounds.  Pulmonary:     Effort: Pulmonary effort is normal. No tachypnea, accessory muscle usage or respiratory distress.     Breath sounds: Normal breath sounds. Decreased air movement and transmitted upper airway sounds present. No wheezing, rhonchi or rales.     Comments: Decreased air movement at the bases.  Chest:     Chest wall: No tenderness.  Lymphadenopathy:     Cervical: No cervical adenopathy.  Skin:    General: Skin is warm.     Capillary Refill: Capillary refill takes less than 2 seconds.     Coloration: Skin is not pale.     Findings: No abrasion, erythema, petechiae or rash. Rash is not papular, urticarial or vesicular.     Comments: No eczematous or urticarial lesions noted.   Neurological:     Mental Status: He is alert.  Psychiatric:        Behavior: Behavior is cooperative.      Diagnostic studies:    Spirometry: results abnormal (FEV1: 1.56/42%, FVC: 2.42/50%, FEV1/FVC: 64%).    Spirometry consistent  with possible restrictive disease. Xopenex/Atrovent  nebulizer treatment given in clinic with significant improvement in FEV1 and FVC per ATS criteria.  Allergy Studies: none      Marty Shaggy, MD  [facility]        "

## 2024-06-28 NOTE — Therapy (Signed)
 " OUTPATIENT PHYSICAL THERAPY LOWER EXTREMITY TREATMENT   Patient Name: Mark Harrell. MRN: [id number] DOB:01-01-1961, 64 y.o., male Today's Date: 06/28/2024  END OF SESSION:  PT End of Session - 06/28/24 1314     Visit Number 15    Date for Recertification  08/05/24    Authorization Type [employer]    Authorization Time Period 01.05.2026 - 03.05.2026    Authorization - Visit Number 5    Authorization - Number of Visits 9    Progress Note Due on Visit 24    PT Start Time 1310    PT Stop Time 1350    PT Time Calculation (min) 40 min    Behavior During Therapy WFL for tasks assessed/performed             Past Medical History:  Diagnosis Date   Allergy    Anesthesia complication requiring reversal agent administration    ? from central apnea, very difficult to get off vent   Anxiety    Arthritis    osteo   Asthma    BPH (benign prostatic hyperplasia)    Cataract    CHF (congestive heart failure) (HCC)    Complication of anesthesia    difficulty waking , they twlight me because of my respiratory problems    COPD (chronic obstructive pulmonary disease) (HCC)    COVID    moderate case 2023   Depression    Diabetes mellitus (HCC) 08/31/2023   DVT (deep venous thrombosis) (HCC) 2023   right leg   Dyspnea    on exertion   Enlarged heart    Family history of adverse reaction to anesthesia    mother trouble waking up, and heart stopped   GERD (gastroesophageal reflux disease)    Headache    botox  injections for headaches   Hyperlipidemia    Hypertension    Hypogonadism male    IBS (irritable bowel syndrome)    Kidney stones    Memory difficulties    short term memories   Neuromuscular disorder (HCC)    Neuropathy    Obesity    On home oxygen  therapy    on 2 liter   OSA (obstructive sleep apnea)    not using cpap   Oxygen  deficiency    Paralysis (HCC)    left hand - small    due to accident with an head injury   Pneumonia    Pre-diabetes    Prostatitis     Sleep apnea    Ulcer    Past Surgical History:  Procedure Laterality Date   ABDOMINAL SURGERY     ANKLE FRACTURE SURGERY Right    ANTERIOR CERVICAL DECOMP/DISCECTOMY FUSION N/A 05/23/2019   Procedure: ANTERIOR CERVICAL DISCECTOMY FUSION CERVICAL FIVE THROUGH CERVICAL SIX AND CERVICAL SIX THROUGH CERVICAL SEVEN;  Surgeon: Lucilla Lynwood BRAVO, MD;  Location: [facility] OR;  Service: Orthopedics;  Laterality: N/A;   BRONCHIAL BRUSHINGS  04/28/2022   Procedure: BRONCHIAL BRUSHINGS;  Surgeon: Shelah Lamar RAMAN, MD;  Location: [facility] ENDOSCOPY;  Service: Pulmonary;;   BRONCHIAL NEEDLE ASPIRATION BIOPSY  04/28/2022   Procedure: BRONCHIAL NEEDLE ASPIRATION BIOPSIES;  Surgeon: Shelah Lamar RAMAN, MD;  Location: [facility] ENDOSCOPY;  Service: Pulmonary;;   COLONOSCOPY     CYSTOSCOPY     Tannebaum   FRACTURE SURGERY     KNEE ARTHROSCOPY WITH MEDIAL MENISECTOMY Left 01/02/2017   Procedure: LEFT KNEE ARTHROSCOPY WITH PARTIAL MEDIAL MENISCECTOMY;  Surgeon: Vernetta Lonni GRADE, MD;  Location: [facility] ORS;  Service: Orthopedics;  Laterality: Left;   POSTERIOR CERVICAL FUSION/FORAMINOTOMY N/A 08/20/2020   Procedure: LEFT CERVICAL SIX THROUGH SEVEN  AND CERVICAL SEVEN THROUGH THORACIC ONE  FORAMINOTOMIES WITH EXCISION OF DISC HERNIATION LEFT CERVICAL SEVEN THROUGH THORACIC ONE;  Surgeon: Lucilla Lynwood BRAVO, MD;  Location: [facility] OR;  Service: Orthopedics;  Laterality: N/A;   SPINE SURGERY     TONSILLECTOMY     TURBINATE RESECTION  2007   UVULOPALATOPHARYNGOPLASTY     VIDEO BRONCHOSCOPY WITH RADIAL ENDOBRONCHIAL ULTRASOUND  04/28/2022   Procedure: VIDEO BRONCHOSCOPY WITH RADIAL ENDOBRONCHIAL ULTRASOUND;  Surgeon: Shelah Lamar RAMAN, MD;  Location: [facility] ENDOSCOPY;  Service: Pulmonary;;   Patient Active Problem List   Diagnosis Date Noted   Chest pain of uncertain etiology 06/01/2024   COPD exacerbation (HCC) 04/26/2024   Hymenoptera reaction 03/25/2024   COVID-19 02/22/2024   Acute on chronic diastolic heart failure (HCC) 09/30/2023   Diabetes  mellitus (HCC) 08/31/2023   Rash 08/31/2023   Cognitive changes 08/31/2023   Unilateral primary osteoarthritis, left knee 02/24/2022   Unilateral primary osteoarthritis, right knee 02/24/2022   Pulmonary nodules 01/28/2022   History of adenomatous polyp of colon 01/08/2022   Hypokalemia 09/26/2021   Dyspnea 09/25/2021   Acute deep vein thrombosis (DVT) of right lower extremity (HCC) 09/24/2021   OSA (obstructive sleep apnea) 09/24/2021   Herniation of cervical intervertebral disc with radiculopathy    Status post cervical discectomy 08/20/2020   Tracheomalacia 05/26/2019   Acquired tracheomalacia 05/26/2019   HNP (herniated nucleus pulposus) with myelopathy, cervical 05/23/2019    Class: Chronic   Spinal stenosis of cervical region 05/23/2019    Class: Chronic   Status post cervical spinal fusion 05/23/2019   Chronic respiratory failure with hypoxia (HCC) 04/12/2018   Bilateral lower extremity edema 12/29/2017   Hymenoptera allergy 11/10/2017   Family history of colonic polyps 08/07/2017   Diverticulosis 08/07/2017   Flatus 08/07/2017   Myofascial pain 08/06/2017   Seasonal and perennial allergic rhinitis 04/23/2017   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/2017   Cervicalgia 04/01/2017   Recurrent pneumonia 03/09/2017   Other spondylosis with radiculopathy, cervical region 02/09/2017   Depression with anxiety 01/04/2017   Memory difficulty 12/05/2016   Diarrhea in adult patient 12/03/2016   Internal hemorrhoids 12/03/2016   Chronic migraine without aura without status migrainosus, not intractable 10/30/2016   Lumbar radiculopathy 05/15/2016   Osteoarthritis of spine with radiculopathy, lumbar region 05/15/2016   Risk for falls 05/15/2016   Recurrent infections 03/29/2016   Chronic nonseasonal allergic rhinitis due to pollen 03/29/2016   Polypharmacy 01/16/2016   Morbid obesity with BMI of 45.0-49.9, adult (HCC) 03/06/2015   SDAT 02/05/2015   OSA and COPD overlap syndrome (HCC)  02/05/2015   Medication management 08/02/2014   GERD (gastroesophageal reflux disease) 05/09/2014   Vitamin D  deficiency 08/01/2013   Positive TB test 07/29/2011   Diverticula of colon 05/07/2011   Hypertension 01/31/2011   Hyperlipidemia, mixed 01/31/2011   BPH (benign prostatic hyperplasia) 01/31/2011   Testosterone  Deficiency 01/31/2011   IBS (irritable bowel syndrome) 01/31/2011   Partial complex seizure disorder with intractable epilepsy (HCC) 01/31/2011   Depression, major, recurrent, in partial remission (HCC) 01/31/2011   Asthma-COPD overlap syndrome (HCC) 01/31/2011    PCP: Quintin Crane MD  REFERRING PROVIDER: Vernetta Lonni GRADE, MD   REFERRING DIAG:  [phone] (ICD-10-CM) - Chronic pain of left knee  M25.561,G89.29 (ICD-10-CM) - Chronic pain of right knee    THERAPY DIAG:  Bilateral chronic knee pain  Muscle weakness (generalized)  Other abnormalities of  gait and mobility  Rationale for Evaluation and Treatment: Rehabilitation  ONSET DATE: >5 yrs  SUBJECTIVE:   SUBJECTIVE STATEMENT:  Pt reports that he was sore after last session (aquatic).  No new changes since last visit. Pt reports he is not currently doing exercises at home.    Initial Subjective OA bilateral knees.  Not a surgical candidate at this time due to elevated BMI. Received steroid injections 01/18/24. Twisted my R knee the other day have basically stayed off of it. R knee hurting > left today usually the other way around. TBI 1985 with left sided le weakness; Had a fall in 2019 hurt my neck was paralyzed waist down resolved on its own, then had a cervical fusion 2020.  Pt reports he does not do a lot of walking as he feels unsafe.   PERTINENT HISTORY: 2020: cervical decompression discectomy/fusion Acute on chronic diastolic heart failure Closed head injury 1985 with residual left side weakness (left leg drags) PAIN:  Are you having pain? Yes: NPRS scale:5/10-generalized  Pain  location: see above Pain description: achy; sharp Aggravating factors: walking; standing Relieving factors: sitting; elevation; ice  PRECAUTIONS: None  RED FLAGS: None   WEIGHT BEARING RESTRICTIONS: No  FALLS:  Has patient fallen in last 6 months? 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  DIAGNOSTIC FINDINGS:  L knee x-ray 2023: 2 views of the left knee show tricompartment arthritis mainly involving  the medial compartment of the knee.  This is moderate arthritis.   R knee x-ray 2023 2 views of the right knee show no significant malalignment.  There is mild  tricompartment arthritic changes.   PATIENT SURVEYS:  Lefs: 22/80 04/29/24:21/80  COGNITION: Overall cognitive status: Within functional limits for tasks assessed     SENSATION: WFL  EDEMA:  Minimal right knee  MUSCLE LENGTH: Hamstrings: tight bilaterally tested in sitting  POSTURE: rounded shoulders, forward head, and slight knee varus bilaterally  PALPATION: TTP medial joint line left knee  LOWER EXTREMITY ROM:  Active ROM Right eval Left eval Right 04/29/24  Hip flexion     Hip extension     Hip abduction     Hip adduction     Hip internal rotation     Hip external rotation     Knee flexion 105 130 130  Knee extension 0 0 0  Ankle dorsiflexion     Ankle plantarflexion     Ankle inversion     Ankle eversion      (Blank rows = not tested)  LOWER EXTREMITY MMT:  MMT Right eval Left eval Right / L 05/04/24 R / L 06/21/24   Hip flexion 20.5 32.9 34.5   Hip extension      Hip abduction      Hip adduction      Hip internal rotation      Hip external rotation      Knee flexion 4/5 53.0    Knee extension 5/5 33.8 43.2 / 36.4 43.0 / 42.3  Ankle dorsiflexion 5 5    Ankle plantarflexion  Ankle inversion      Ankle eversion       (Blank rows = not tested)  FUNCTIONAL TESTS:  Timed up and go (TUG): 28.67  4 stage balance: Passed 1&2.  Tandem and SLS unable 04/29/24: 18.58 sec with SPC  GAIT: Distance walked: 400 ft Assistive device utilized: Single point cane Level of assistance: Complete Independence Comments: Pt pauses upon standing then initiates gait. Knee flex                                                                                                                                 TREATMENT  [facility] Adult PT Treatment:                                                DATE: 06/28/24  Therapeutic Exercise: NuStep L4: UE/LE x 5.5 min for dynamic warm up STS from NuStep seat x 5  Seated LAQ, 5 sec hold x 8 Seated shoulder extension with green band x 10; seated mid row with green band x 10 Standing row with green band in staggered stance x 10 Standing shoulder extension with red band x 10 Seated hamstring stretch with cues for posture 2x15 sec each LE Seated fig4 stretch 15 sec x 2 each LE   [facility] Adult PT Treatment:                                                DATE: 06/21/24 Pt seen for aquatic therapy today.  Treatment took place in water 3.5-4.75 ft in depth at the [facility] pool. Temp of water was 91.  Pt entered/exited the pool via stairs using step to pattern with hand rail.  * walking forward/ backward  * side stepping with arm add/abd with yellow hand floats * marching forward/ backward with kick * 3 way LE kicks with UE On yellow hand floats, 2 x 5  * Bow & arrow * alternating toe taps to 2nd step 2 x 10 *tandem stance 3.6 ft leading R/L x 17s *SLS Left unsupported x  16s   HD strength testing  Pt requires the buoyancy and hydrostatic pressure of water for support, and to offload joints by unweighting joint load by at least 50 % in navel deep water and by at least 75-80% in chest to neck deep water.  Viscosity of the water is needed for resistance of strengthening. Water current perturbations provides challenge to standing balance requiring increased core activation.       PATIENT EDUCATION:  Education details: intro to aquatic therapy   Person educated: Patient Education method: Explanation, demo,  Education comprehension: verbalized understanding, return demo  HOME EXERCISE PROGRAM:  Aquatic Access Code: [email or web address] URL: [email or web address] Date: 06/21/2024 Prepared by: Matilda Kohut  Exercises - Single Leg Stance /Arm up and down with hand buoy  - 1 x daily - 7 x weekly - 3 sets - 10 reps - Side lunge with hand buoys  - 1 x daily - 7 x weekly - 3 sets - 10 reps - Bow and Arrow   - 1 x daily - 7 x weekly - 3 sets - 10 reps - Standing 3-Way Leg Reach  - 1 x daily - 7 x weekly - 3 sets - 10 reps - Squat  - 1 x daily - 7 x weekly - 3 sets - 10 reps - forward march with knee kick  - 1 x daily - 7 x weekly - 3 sets - 10 reps - Standing Toe Taps  - 1 x daily - 7 x weekly - 3 sets - 10 reps  ASSESSMENT:  CLINICAL IMPRESSION: Pt has fully transitioned to land based exercise for added load for strengthening. Pt is unsure if he will begin going to gym for pool access, for continued management of his chronic conditions. He tolerated all exercises without increase in pain.  Pt was issued green band for standing row exercise (declined hand out this date). Will continue to work on creating land HEP and progress as tolerated. Pt is progressing gradually towards remaining goals.        Progress Note: see note below. Testing demonstrates improved right hip strength meeting LTG.  He continues to have reduced pain sensitivity in knees since  injections reporting increased ease with functional mobility. He will continue to benefit from skilled PT aquatic intervention to progress towards previous and new stated goals. May consider adding land based intervention going forward.    OBJECTIVE IMPAIRMENTS: Abnormal gait, decreased activity tolerance, decreased balance, decreased knowledge of use of DME, decreased mobility, difficulty walking, decreased ROM, decreased strength, obesity, and pain.   ACTIVITY LIMITATIONS: carrying, lifting, sitting, squatting, stairs, transfers, and locomotion level  PARTICIPATION LIMITATIONS: meal prep, cleaning, laundry, shopping, community activity, and yard work  PERSONAL FACTORS: Fitness and 3+ comorbidities: see PmHx are also affecting patient's functional outcome.   REHAB POTENTIAL: Good  CLINICAL DECISION MAKING: Stable/uncomplicated  EVALUATION COMPLEXITY: Low   GOALS: Goals reviewed with patient? No  SHORT TERM GOALS: Target date: 03/26/24 Pt will tolerate full aquatic sessions consistently without increase in pain and with improving function to demonstrate good toleration and effectiveness of intervention.  Baseline: Goal status: Met 03/30/24  2.  Pt will tolerate stair climbing using alternating or combination of alternating and step to pattern ascending and descending 6 steps with use of handrail  Baseline:  Goal status: Met 03/30/24  3.  Pt will consider gaining pool access for use of the properties of water for chronic conditions maintaining mobility and minimizing pain. Baseline: has [employer]; hasn't been there yet Goal status: MET - 04/29/24    LONG TERM GOALS: Target date: 08/05/24  Pt to improve on LEFS by at least 9 point to demonstrate statistically significant Improvement in function. Baseline: see above - 04/29/24 Goal status: In progress   2. Pt will tolerate walking to and from setting and engaging in aquatic therapy session without excessive fatigue or increase  in pain to demonstrate improved toleration to activity.  Baseline: improving; varies day to day Goal status:met 06/21/24  3.  Pt will improve strength in RLE within 5 lbs of left to demonstrate improved overall physical function Baseline:  Goal status: Met 05/04/24  4.  Pt will improve ROM of  R knee flex to within 5d of left to improve function, decrease risk of injury and pain and improve quality of gait Baseline: See chart Goal status: MET- 04/29/24  5.  Pt will be indep with final HEP's (land and aquatic as appropriate) for continued management of condition Baseline: aquatics deferred as of 1/27 as pt does not have pool access Goal status: In progress 06/21/24   6. Pt will perform Tandem and SLS in 3.6 ft unsupported x 15s to demonstrate improved balance   Baseline: see above   Goals Status: Met 06/21/24   7. Pt will perform 10 consecutive STS from water bench onto water step to demonstrate improved immediate standing balance and strength.   Baseline:   Goal Status: MET- 06/14/24    8. Pt will report improved toleration to amb community distances with AD (grocery shopping)   Baseline   Goal Status: NEW PLAN:  PT FREQUENCY: 1-2x/week  PT DURATION: 6 weeks  PLANNED INTERVENTIONS: [id number]- PT Re-evaluation, 97750- Physical Performance Testing, 97110-Therapeutic exercises, 97530- Therapeutic activity, 97112- Neuromuscular re-education, 97535- Self Care, [id number]- Manual therapy, [id number]- Gait training, [phone]- Aquatic Therapy,  [phone] (1-2 muscles), [id number] (3+ muscles)- Dry Needling, Patient/Family education, Balance training, Stair training, Taping, Joint mobilization, DME instructions, Cryotherapy, and Moist heat  PLAN FOR NEXT SESSION: land: general strengthening with focus on knees and core; balance and proprioceptive retraining; stair climbing   Delon Aquas, PTA 06/28/24 2:16 PM [facility] [address] [place], [place],  [address] Phone: [phone]   Fax:  [phone]    "

## 2024-06-29 ENCOUNTER — Other Ambulatory Visit: Payer: Self-pay

## 2024-06-29 ENCOUNTER — Other Ambulatory Visit (HOSPITAL_COMMUNITY): Payer: Self-pay

## 2024-06-29 ENCOUNTER — Encounter: Payer: Self-pay | Admitting: Allergy & Immunology

## 2024-06-29 MED ORDER — DUPIXENT 300 MG/2ML ~~LOC~~ SOSY
300.0000 mg | PREFILLED_SYRINGE | SUBCUTANEOUS | 11 refills | Status: AC
  Filled 2024-06-29: qty 4, 14d supply, fill #0
  Filled 2024-06-30: qty 4, 28d supply, fill #0

## 2024-06-30 ENCOUNTER — Other Ambulatory Visit (HOSPITAL_COMMUNITY): Payer: Self-pay

## 2024-06-30 ENCOUNTER — Other Ambulatory Visit: Payer: Self-pay

## 2024-06-30 NOTE — Progress Notes (Signed)
 Specialty Pharmacy Refill Coordination Note  Spoke with [name] Celena Teddie [name] is a 64 y.o. male contacted today regarding refills of specialty medication(s) Dupilumab  (Dupixent )  Doses on hand: 1 for 2/6  Next inj: 07/15/24   Patient requested: Delivery   Delivery date: 07/12/24   Verified address: [address] [place] [address]  Medication will be filled on 07/11/24

## 2024-07-05 ENCOUNTER — Ambulatory Visit (HOSPITAL_BASED_OUTPATIENT_CLINIC_OR_DEPARTMENT_OTHER)

## 2024-07-12 ENCOUNTER — Encounter (HOSPITAL_BASED_OUTPATIENT_CLINIC_OR_DEPARTMENT_OTHER)

## 2024-07-18 ENCOUNTER — Ambulatory Visit: Admitting: Pharmacist Clinician (PhC)/ Clinical Pharmacy Specialist

## 2024-07-19 ENCOUNTER — Encounter (HOSPITAL_BASED_OUTPATIENT_CLINIC_OR_DEPARTMENT_OTHER): Admitting: Physical Therapy

## 2024-07-25 ENCOUNTER — Encounter (HOSPITAL_BASED_OUTPATIENT_CLINIC_OR_DEPARTMENT_OTHER): Admitting: Physical Therapy

## 2024-07-26 ENCOUNTER — Ambulatory Visit: Admitting: Adult Health

## 2024-08-02 ENCOUNTER — Ambulatory Visit: Admitting: Podiatry

## 2024-08-25 ENCOUNTER — Ambulatory Visit (HOSPITAL_BASED_OUTPATIENT_CLINIC_OR_DEPARTMENT_OTHER): Admitting: Family Medicine

## 2024-08-31 ENCOUNTER — Ambulatory Visit: Admitting: Emergency Medicine

## 2024-09-13 ENCOUNTER — Ambulatory Visit: Admitting: Diagnostic Neuroimaging

## 2024-09-29 ENCOUNTER — Ambulatory Visit: Admitting: Allergy & Immunology
